# Patient Record
Sex: Female | Born: 1955 | Race: White | Hispanic: No | Smoking: Former smoker
Health system: Southern US, Community
[De-identification: ages and names within clinical notes are randomized; demographics above are authoritative.]

## PROBLEM LIST (undated history)

## (undated) DIAGNOSIS — N183 Chronic kidney disease, stage 3 unspecified: Secondary | ICD-10-CM

## (undated) DIAGNOSIS — I209 Angina pectoris, unspecified: Secondary | ICD-10-CM

## (undated) DIAGNOSIS — Q249 Congenital malformation of heart, unspecified: Secondary | ICD-10-CM

## (undated) DIAGNOSIS — F1190 Opioid use, unspecified, uncomplicated: Secondary | ICD-10-CM

## (undated) DIAGNOSIS — M4800 Spinal stenosis, site unspecified: Secondary | ICD-10-CM

## (undated) DIAGNOSIS — F419 Anxiety disorder, unspecified: Secondary | ICD-10-CM

## (undated) DIAGNOSIS — K449 Diaphragmatic hernia without obstruction or gangrene: Secondary | ICD-10-CM

## (undated) DIAGNOSIS — F32A Depression, unspecified: Secondary | ICD-10-CM

## (undated) DIAGNOSIS — M960 Pseudarthrosis after fusion or arthrodesis: Secondary | ICD-10-CM

## (undated) DIAGNOSIS — B019 Varicella without complication: Secondary | ICD-10-CM

## (undated) DIAGNOSIS — M238X9 Other internal derangements of unspecified knee: Secondary | ICD-10-CM

## (undated) DIAGNOSIS — G629 Polyneuropathy, unspecified: Secondary | ICD-10-CM

## (undated) DIAGNOSIS — G4730 Sleep apnea, unspecified: Secondary | ICD-10-CM

## (undated) DIAGNOSIS — A419 Sepsis, unspecified organism: Secondary | ICD-10-CM

## (undated) DIAGNOSIS — R5382 Chronic fatigue, unspecified: Secondary | ICD-10-CM

## (undated) DIAGNOSIS — F329 Major depressive disorder, single episode, unspecified: Secondary | ICD-10-CM

## (undated) DIAGNOSIS — I2510 Atherosclerotic heart disease of native coronary artery without angina pectoris: Secondary | ICD-10-CM

## (undated) DIAGNOSIS — M1990 Unspecified osteoarthritis, unspecified site: Secondary | ICD-10-CM

## (undated) DIAGNOSIS — I456 Pre-excitation syndrome: Secondary | ICD-10-CM

## (undated) DIAGNOSIS — I2589 Other forms of chronic ischemic heart disease: Secondary | ICD-10-CM

## (undated) DIAGNOSIS — S98111A Complete traumatic amputation of right great toe, initial encounter: Secondary | ICD-10-CM

## (undated) DIAGNOSIS — Z22322 Carrier or suspected carrier of Methicillin resistant Staphylococcus aureus: Secondary | ICD-10-CM

## (undated) DIAGNOSIS — I700 Atherosclerosis of aorta: Secondary | ICD-10-CM

## (undated) DIAGNOSIS — K5790 Diverticulosis of intestine, part unspecified, without perforation or abscess without bleeding: Secondary | ICD-10-CM

## (undated) DIAGNOSIS — S98131A Complete traumatic amputation of one right lesser toe, initial encounter: Secondary | ICD-10-CM

## (undated) DIAGNOSIS — M47819 Spondylosis without myelopathy or radiculopathy, site unspecified: Secondary | ICD-10-CM

## (undated) DIAGNOSIS — D649 Anemia, unspecified: Secondary | ICD-10-CM

## (undated) DIAGNOSIS — M797 Fibromyalgia: Secondary | ICD-10-CM

## (undated) DIAGNOSIS — G2581 Restless legs syndrome: Secondary | ICD-10-CM

## (undated) DIAGNOSIS — E785 Hyperlipidemia, unspecified: Secondary | ICD-10-CM

## (undated) DIAGNOSIS — E119 Type 2 diabetes mellitus without complications: Secondary | ICD-10-CM

## (undated) DIAGNOSIS — I519 Heart disease, unspecified: Secondary | ICD-10-CM

## (undated) DIAGNOSIS — K589 Irritable bowel syndrome without diarrhea: Secondary | ICD-10-CM

## (undated) DIAGNOSIS — R230 Cyanosis: Secondary | ICD-10-CM

## (undated) DIAGNOSIS — IMO0002 Reserved for concepts with insufficient information to code with codable children: Secondary | ICD-10-CM

## (undated) DIAGNOSIS — N289 Disorder of kidney and ureter, unspecified: Secondary | ICD-10-CM

## (undated) DIAGNOSIS — S301XXA Contusion of abdominal wall, initial encounter: Secondary | ICD-10-CM

## (undated) DIAGNOSIS — N189 Chronic kidney disease, unspecified: Secondary | ICD-10-CM

## (undated) HISTORY — DX: Other internal derangements of unspecified knee: M23.8X9

## (undated) HISTORY — DX: Fibromyalgia: M79.7

## (undated) HISTORY — PX: EYE SURGERY: SHX253

## (undated) HISTORY — PX: CARPAL TUNNEL RELEASE: SHX101

## (undated) HISTORY — PX: JOINT REPLACEMENT: SHX530

## (undated) HISTORY — PX: PLANTAR FASCIA SURGERY: SHX746

## (undated) HISTORY — PX: INFUSION PUMP IMPLANTATION: SHX1824

## (undated) HISTORY — DX: Other forms of chronic ischemic heart disease: I25.89

## (undated) HISTORY — DX: Heart disease, unspecified: I51.9

## (undated) HISTORY — DX: Spinal stenosis, site unspecified: M48.00

## (undated) HISTORY — PX: OTHER SURGICAL HISTORY: SHX169

## (undated) HISTORY — PX: SPINE SURGERY: SHX786

## (undated) HISTORY — DX: Varicella without complication: B01.9

## (undated) HISTORY — PX: FOOT SURGERY: SHX648

## (undated) HISTORY — DX: Cyanosis: R23.0

## (undated) HISTORY — PX: GALLBLADDER SURGERY: SHX652

## (undated) HISTORY — PX: TOE SURGERY: SHX1073

## (undated) HISTORY — DX: Reserved for concepts with insufficient information to code with codable children: IMO0002

## (undated) HISTORY — DX: Polyneuropathy, unspecified: G62.9

## (undated) HISTORY — PX: TOE AMPUTATION: SHX809

## (undated) HISTORY — DX: Spondylosis without myelopathy or radiculopathy, site unspecified: M47.819

## (undated) HISTORY — DX: Congenital malformation of heart, unspecified: Q24.9

## (undated) HISTORY — DX: Carrier or suspected carrier of methicillin resistant Staphylococcus aureus: Z22.322

## (undated) HISTORY — PX: KNEE SURGERY: SHX244

## (undated) HISTORY — DX: Hyperlipidemia, unspecified: E78.5

## (undated) HISTORY — PX: BACK SURGERY: SHX140

## (undated) HISTORY — PX: ABLATION: SHX5711

## (undated) HISTORY — PX: HAND SURGERY: SHX662

---

## 1990-07-02 DIAGNOSIS — Q249 Congenital malformation of heart, unspecified: Secondary | ICD-10-CM

## 1990-07-02 DIAGNOSIS — I456 Pre-excitation syndrome: Secondary | ICD-10-CM

## 1990-07-02 HISTORY — PX: CARDIAC ELECTROPHYSIOLOGY STUDY AND ABLATION: SHX1294

## 1990-07-02 HISTORY — DX: Congenital malformation of heart, unspecified: Q24.9

## 1990-07-02 HISTORY — DX: Pre-excitation syndrome: I45.6

## 1998-05-08 ENCOUNTER — Ambulatory Visit (HOSPITAL_COMMUNITY): Admission: RE | Admit: 1998-05-08 | Discharge: 1998-05-08 | Payer: Self-pay | Admitting: Neurosurgery

## 1998-05-08 ENCOUNTER — Encounter: Payer: Self-pay | Admitting: Neurosurgery

## 1999-05-09 ENCOUNTER — Encounter: Payer: Self-pay | Admitting: Emergency Medicine

## 1999-05-09 ENCOUNTER — Emergency Department (HOSPITAL_COMMUNITY): Admission: EM | Admit: 1999-05-09 | Discharge: 1999-05-09 | Payer: Self-pay | Admitting: Emergency Medicine

## 1999-05-11 ENCOUNTER — Other Ambulatory Visit: Admission: RE | Admit: 1999-05-11 | Discharge: 1999-05-11 | Payer: Self-pay | Admitting: *Deleted

## 2000-02-18 ENCOUNTER — Ambulatory Visit (HOSPITAL_COMMUNITY): Admission: RE | Admit: 2000-02-18 | Discharge: 2000-02-18 | Payer: Self-pay | Admitting: Neurosurgery

## 2000-02-18 ENCOUNTER — Encounter: Payer: Self-pay | Admitting: Neurosurgery

## 2000-09-12 ENCOUNTER — Encounter: Payer: Self-pay | Admitting: Family Medicine

## 2000-09-12 ENCOUNTER — Encounter: Admission: RE | Admit: 2000-09-12 | Discharge: 2000-09-12 | Payer: Self-pay | Admitting: Family Medicine

## 2000-10-04 ENCOUNTER — Encounter (INDEPENDENT_AMBULATORY_CARE_PROVIDER_SITE_OTHER): Payer: Self-pay

## 2000-10-04 ENCOUNTER — Other Ambulatory Visit: Admission: RE | Admit: 2000-10-04 | Discharge: 2000-10-04 | Payer: Self-pay | Admitting: Gynecology

## 2000-10-11 ENCOUNTER — Encounter: Payer: Self-pay | Admitting: Family Medicine

## 2000-10-11 ENCOUNTER — Encounter: Admission: RE | Admit: 2000-10-11 | Discharge: 2000-10-11 | Payer: Self-pay | Admitting: Family Medicine

## 2000-11-06 ENCOUNTER — Ambulatory Visit (HOSPITAL_BASED_OUTPATIENT_CLINIC_OR_DEPARTMENT_OTHER): Admission: RE | Admit: 2000-11-06 | Discharge: 2000-11-06 | Payer: Self-pay | Admitting: *Deleted

## 2000-11-06 ENCOUNTER — Encounter (INDEPENDENT_AMBULATORY_CARE_PROVIDER_SITE_OTHER): Payer: Self-pay | Admitting: *Deleted

## 2000-11-20 ENCOUNTER — Inpatient Hospital Stay (HOSPITAL_COMMUNITY): Admission: AD | Admit: 2000-11-20 | Discharge: 2000-11-20 | Payer: Self-pay | Admitting: Gynecology

## 2001-07-02 HISTORY — PX: CHOLECYSTECTOMY: SHX55

## 2001-11-04 ENCOUNTER — Encounter: Payer: Self-pay | Admitting: Gastroenterology

## 2001-11-04 ENCOUNTER — Encounter: Admission: RE | Admit: 2001-11-04 | Discharge: 2001-11-04 | Payer: Self-pay | Admitting: Infectious Diseases

## 2001-11-04 ENCOUNTER — Encounter: Admission: RE | Admit: 2001-11-04 | Discharge: 2001-11-04 | Payer: Self-pay | Admitting: Gastroenterology

## 2001-11-10 ENCOUNTER — Encounter: Admission: RE | Admit: 2001-11-10 | Discharge: 2001-11-10 | Payer: Self-pay | Admitting: Gastroenterology

## 2001-11-10 ENCOUNTER — Encounter: Payer: Self-pay | Admitting: Gastroenterology

## 2001-11-13 ENCOUNTER — Encounter: Payer: Self-pay | Admitting: Gastroenterology

## 2001-11-13 ENCOUNTER — Encounter: Admission: RE | Admit: 2001-11-13 | Discharge: 2001-11-13 | Payer: Self-pay | Admitting: Gastroenterology

## 2001-11-25 ENCOUNTER — Encounter: Admission: RE | Admit: 2001-11-25 | Discharge: 2001-11-25 | Payer: Self-pay | Admitting: Infectious Diseases

## 2002-06-20 ENCOUNTER — Encounter: Admission: RE | Admit: 2002-06-20 | Discharge: 2002-06-20 | Payer: Self-pay | Admitting: *Deleted

## 2002-06-20 ENCOUNTER — Encounter: Payer: Self-pay | Admitting: *Deleted

## 2002-06-30 ENCOUNTER — Encounter: Payer: Self-pay | Admitting: *Deleted

## 2002-06-30 ENCOUNTER — Encounter: Admission: RE | Admit: 2002-06-30 | Discharge: 2002-06-30 | Payer: Self-pay | Admitting: *Deleted

## 2003-08-17 DIAGNOSIS — Z9989 Dependence on other enabling machines and devices: Secondary | ICD-10-CM

## 2003-08-17 DIAGNOSIS — G4733 Obstructive sleep apnea (adult) (pediatric): Secondary | ICD-10-CM

## 2003-08-17 DIAGNOSIS — G4730 Sleep apnea, unspecified: Secondary | ICD-10-CM

## 2003-08-17 HISTORY — DX: Obstructive sleep apnea (adult) (pediatric): G47.33

## 2003-08-17 HISTORY — DX: Obstructive sleep apnea (adult) (pediatric): Z99.89

## 2003-08-17 HISTORY — DX: Sleep apnea, unspecified: G47.30

## 2004-04-20 ENCOUNTER — Ambulatory Visit: Payer: Self-pay | Admitting: Physician Assistant

## 2004-04-29 ENCOUNTER — Ambulatory Visit: Payer: Self-pay | Admitting: Physician Assistant

## 2004-05-30 ENCOUNTER — Ambulatory Visit: Payer: Self-pay | Admitting: Physician Assistant

## 2004-06-22 ENCOUNTER — Ambulatory Visit: Payer: Self-pay | Admitting: Gastroenterology

## 2004-06-28 ENCOUNTER — Ambulatory Visit: Payer: Self-pay | Admitting: Physician Assistant

## 2004-07-27 ENCOUNTER — Ambulatory Visit: Payer: Self-pay | Admitting: Physician Assistant

## 2004-09-04 ENCOUNTER — Ambulatory Visit: Payer: Self-pay | Admitting: Physician Assistant

## 2004-09-08 ENCOUNTER — Ambulatory Visit: Payer: Self-pay | Admitting: Pain Medicine

## 2004-09-12 ENCOUNTER — Ambulatory Visit: Payer: Self-pay | Admitting: Pain Medicine

## 2004-09-25 ENCOUNTER — Ambulatory Visit: Payer: Self-pay | Admitting: Pain Medicine

## 2004-10-26 ENCOUNTER — Ambulatory Visit: Payer: Self-pay | Admitting: Pain Medicine

## 2004-11-01 ENCOUNTER — Ambulatory Visit: Payer: Self-pay | Admitting: Pain Medicine

## 2004-11-02 ENCOUNTER — Ambulatory Visit: Payer: Self-pay | Admitting: Pain Medicine

## 2004-11-15 ENCOUNTER — Ambulatory Visit: Payer: Self-pay | Admitting: Pain Medicine

## 2004-12-18 ENCOUNTER — Ambulatory Visit: Payer: Self-pay | Admitting: Pain Medicine

## 2004-12-18 ENCOUNTER — Inpatient Hospital Stay: Payer: Self-pay | Admitting: Pain Medicine

## 2004-12-27 ENCOUNTER — Ambulatory Visit: Payer: Self-pay | Admitting: Pain Medicine

## 2005-01-24 ENCOUNTER — Ambulatory Visit: Payer: Self-pay | Admitting: Pain Medicine

## 2005-01-24 ENCOUNTER — Other Ambulatory Visit: Payer: Self-pay

## 2005-01-31 ENCOUNTER — Ambulatory Visit: Payer: Self-pay | Admitting: Pain Medicine

## 2005-02-09 ENCOUNTER — Ambulatory Visit: Payer: Self-pay | Admitting: Pain Medicine

## 2005-02-26 ENCOUNTER — Ambulatory Visit: Payer: Self-pay | Admitting: Pain Medicine

## 2005-06-07 ENCOUNTER — Ambulatory Visit: Payer: Self-pay | Admitting: Physician Assistant

## 2005-07-02 HISTORY — PX: ENDOMETRIAL ABLATION: SHX621

## 2005-07-02 HISTORY — PX: ABLATION: SHX5711

## 2005-09-03 ENCOUNTER — Ambulatory Visit: Payer: Self-pay | Admitting: Physician Assistant

## 2005-10-15 ENCOUNTER — Ambulatory Visit: Payer: Self-pay | Admitting: Physician Assistant

## 2005-11-19 ENCOUNTER — Ambulatory Visit: Payer: Self-pay | Admitting: Physician Assistant

## 2005-11-22 ENCOUNTER — Ambulatory Visit: Payer: Self-pay | Admitting: Pain Medicine

## 2005-11-29 ENCOUNTER — Ambulatory Visit: Payer: Self-pay | Admitting: Physician Assistant

## 2006-03-28 ENCOUNTER — Ambulatory Visit: Payer: Self-pay | Admitting: Physician Assistant

## 2006-05-20 ENCOUNTER — Ambulatory Visit: Payer: Self-pay | Admitting: Physician Assistant

## 2006-05-21 ENCOUNTER — Ambulatory Visit: Payer: Self-pay | Admitting: Pain Medicine

## 2006-07-02 HISTORY — PX: CORONARY ANGIOPLASTY: SHX604

## 2006-07-25 ENCOUNTER — Ambulatory Visit: Payer: Self-pay | Admitting: Physician Assistant

## 2006-09-02 ENCOUNTER — Encounter: Admission: RE | Admit: 2006-09-02 | Discharge: 2006-09-02 | Payer: Self-pay | Admitting: Family Medicine

## 2006-09-19 ENCOUNTER — Ambulatory Visit: Payer: Self-pay | Admitting: Physician Assistant

## 2006-11-15 ENCOUNTER — Ambulatory Visit: Payer: Self-pay | Admitting: Physician Assistant

## 2006-11-20 ENCOUNTER — Ambulatory Visit: Payer: Self-pay | Admitting: Physician Assistant

## 2007-03-18 ENCOUNTER — Ambulatory Visit: Payer: Self-pay | Admitting: Family Medicine

## 2007-03-20 ENCOUNTER — Ambulatory Visit: Payer: Self-pay | Admitting: Physician Assistant

## 2007-04-01 ENCOUNTER — Ambulatory Visit: Payer: Self-pay | Admitting: Pain Medicine

## 2007-04-15 ENCOUNTER — Ambulatory Visit: Payer: Self-pay | Admitting: Physician Assistant

## 2007-06-19 ENCOUNTER — Ambulatory Visit: Payer: Self-pay | Admitting: Pain Medicine

## 2007-06-24 ENCOUNTER — Encounter: Payer: Self-pay | Admitting: Pain Medicine

## 2007-07-03 ENCOUNTER — Encounter: Payer: Self-pay | Admitting: Pain Medicine

## 2007-07-03 DIAGNOSIS — I2510 Atherosclerotic heart disease of native coronary artery without angina pectoris: Secondary | ICD-10-CM

## 2007-07-03 HISTORY — DX: Atherosclerotic heart disease of native coronary artery without angina pectoris: I25.10

## 2007-07-03 HISTORY — PX: CORONARY ANGIOPLASTY WITH STENT PLACEMENT: SHX49

## 2007-07-03 HISTORY — PX: OTHER SURGICAL HISTORY: SHX169

## 2007-07-16 ENCOUNTER — Ambulatory Visit: Payer: Self-pay | Admitting: Physician Assistant

## 2007-07-29 ENCOUNTER — Emergency Department (HOSPITAL_COMMUNITY): Admission: EM | Admit: 2007-07-29 | Discharge: 2007-07-29 | Payer: Self-pay | Admitting: Emergency Medicine

## 2007-08-03 ENCOUNTER — Encounter: Payer: Self-pay | Admitting: Pain Medicine

## 2007-11-12 ENCOUNTER — Ambulatory Visit: Payer: Self-pay | Admitting: Physician Assistant

## 2007-11-20 ENCOUNTER — Ambulatory Visit: Payer: Self-pay | Admitting: Pain Medicine

## 2007-11-26 ENCOUNTER — Ambulatory Visit: Payer: Self-pay | Admitting: Pain Medicine

## 2007-12-09 ENCOUNTER — Ambulatory Visit: Payer: Self-pay | Admitting: Physician Assistant

## 2007-12-11 ENCOUNTER — Encounter: Admission: RE | Admit: 2007-12-11 | Discharge: 2007-12-11 | Payer: Self-pay | Admitting: Obstetrics and Gynecology

## 2007-12-23 ENCOUNTER — Encounter: Admission: RE | Admit: 2007-12-23 | Discharge: 2007-12-23 | Payer: Self-pay | Admitting: Obstetrics and Gynecology

## 2008-01-15 ENCOUNTER — Ambulatory Visit: Payer: Self-pay | Admitting: Physician Assistant

## 2008-01-22 ENCOUNTER — Ambulatory Visit: Payer: Self-pay | Admitting: Pain Medicine

## 2008-02-11 ENCOUNTER — Ambulatory Visit: Payer: Self-pay | Admitting: Physician Assistant

## 2008-02-19 ENCOUNTER — Inpatient Hospital Stay (HOSPITAL_COMMUNITY): Admission: AD | Admit: 2008-02-19 | Discharge: 2008-02-21 | Payer: Self-pay | Admitting: Interventional Cardiology

## 2008-03-10 ENCOUNTER — Ambulatory Visit: Payer: Self-pay | Admitting: Physician Assistant

## 2008-03-12 ENCOUNTER — Emergency Department (HOSPITAL_COMMUNITY): Admission: EM | Admit: 2008-03-12 | Discharge: 2008-03-12 | Payer: Self-pay | Admitting: Emergency Medicine

## 2008-07-07 ENCOUNTER — Ambulatory Visit: Payer: Self-pay | Admitting: Physician Assistant

## 2008-07-19 ENCOUNTER — Ambulatory Visit: Payer: Self-pay | Admitting: Family Medicine

## 2008-08-04 ENCOUNTER — Ambulatory Visit: Payer: Self-pay | Admitting: Physician Assistant

## 2008-11-02 ENCOUNTER — Ambulatory Visit: Payer: Self-pay | Admitting: Physician Assistant

## 2009-01-26 ENCOUNTER — Ambulatory Visit: Payer: Self-pay | Admitting: Physician Assistant

## 2009-02-15 ENCOUNTER — Ambulatory Visit: Payer: Self-pay | Admitting: Orthopaedic Surgery

## 2009-05-03 ENCOUNTER — Ambulatory Visit: Payer: Self-pay | Admitting: Physician Assistant

## 2009-05-17 ENCOUNTER — Ambulatory Visit: Payer: Self-pay | Admitting: Pain Medicine

## 2009-06-02 ENCOUNTER — Ambulatory Visit: Payer: Self-pay | Admitting: Physician Assistant

## 2009-07-02 DIAGNOSIS — Z22322 Carrier or suspected carrier of Methicillin resistant Staphylococcus aureus: Secondary | ICD-10-CM

## 2009-07-02 HISTORY — DX: Carrier or suspected carrier of methicillin resistant Staphylococcus aureus: Z22.322

## 2009-08-22 ENCOUNTER — Ambulatory Visit: Payer: Self-pay | Admitting: Pain Medicine

## 2009-09-05 ENCOUNTER — Encounter: Payer: Self-pay | Admitting: Pain Medicine

## 2009-09-06 ENCOUNTER — Ambulatory Visit: Payer: Self-pay | Admitting: Pain Medicine

## 2009-09-22 ENCOUNTER — Ambulatory Visit: Payer: Self-pay | Admitting: Pain Medicine

## 2009-10-04 ENCOUNTER — Encounter: Payer: Self-pay | Admitting: Pain Medicine

## 2009-10-29 ENCOUNTER — Emergency Department: Payer: Self-pay | Admitting: Emergency Medicine

## 2009-10-31 ENCOUNTER — Encounter: Payer: Self-pay | Admitting: Pain Medicine

## 2009-11-03 ENCOUNTER — Ambulatory Visit: Payer: Self-pay | Admitting: Pain Medicine

## 2009-11-21 ENCOUNTER — Ambulatory Visit: Payer: Self-pay | Admitting: Pain Medicine

## 2009-12-05 ENCOUNTER — Encounter: Payer: Self-pay | Admitting: Orthopedic Surgery

## 2009-12-12 ENCOUNTER — Ambulatory Visit: Payer: Self-pay | Admitting: Pain Medicine

## 2009-12-30 ENCOUNTER — Encounter: Payer: Self-pay | Admitting: Orthopedic Surgery

## 2010-01-30 ENCOUNTER — Encounter: Payer: Self-pay | Admitting: Orthopedic Surgery

## 2010-03-01 ENCOUNTER — Encounter: Payer: Self-pay | Admitting: Infectious Disease

## 2010-03-08 ENCOUNTER — Ambulatory Visit (HOSPITAL_COMMUNITY): Admission: RE | Admit: 2010-03-08 | Discharge: 2010-03-08 | Payer: Self-pay | Admitting: Podiatry

## 2010-03-18 ENCOUNTER — Other Ambulatory Visit: Payer: Self-pay | Admitting: Podiatry

## 2010-03-23 ENCOUNTER — Other Ambulatory Visit: Payer: Self-pay | Admitting: Podiatry

## 2010-04-04 ENCOUNTER — Other Ambulatory Visit: Payer: Self-pay | Admitting: Podiatry

## 2010-04-05 ENCOUNTER — Other Ambulatory Visit: Payer: Self-pay | Admitting: Podiatry

## 2010-04-18 ENCOUNTER — Ambulatory Visit: Payer: Self-pay | Admitting: Infectious Disease

## 2010-04-18 DIAGNOSIS — G629 Polyneuropathy, unspecified: Secondary | ICD-10-CM

## 2010-04-18 DIAGNOSIS — M86679 Other chronic osteomyelitis, unspecified ankle and foot: Secondary | ICD-10-CM | POA: Insufficient documentation

## 2010-04-18 DIAGNOSIS — Z9189 Other specified personal risk factors, not elsewhere classified: Secondary | ICD-10-CM | POA: Insufficient documentation

## 2010-04-18 DIAGNOSIS — G894 Chronic pain syndrome: Secondary | ICD-10-CM | POA: Insufficient documentation

## 2010-04-18 HISTORY — DX: Polyneuropathy, unspecified: G62.9

## 2010-04-19 ENCOUNTER — Ambulatory Visit (HOSPITAL_COMMUNITY): Admission: RE | Admit: 2010-04-19 | Discharge: 2010-04-19 | Payer: Self-pay | Admitting: Infectious Disease

## 2010-04-19 ENCOUNTER — Encounter: Payer: Self-pay | Admitting: Infectious Diseases

## 2010-04-19 LAB — CONVERTED CEMR LAB
ALT: 23 units/L (ref 0–35)
AST: 25 units/L (ref 0–37)
Albumin: 4.9 g/dL (ref 3.5–5.2)
Alkaline Phosphatase: 140 units/L — ABNORMAL HIGH (ref 39–117)
BUN: 16 mg/dL (ref 6–23)
Basophils Absolute: 0.1 10*3/uL (ref 0.0–0.1)
Basophils Relative: 1 % (ref 0–1)
CO2: 27 meq/L (ref 19–32)
CRP: 0.3 mg/dL (ref <0.6)
Calcium: 9.5 mg/dL (ref 8.4–10.5)
Chloride: 101 meq/L (ref 96–112)
Creatinine, Ser: 1.04 mg/dL (ref 0.40–1.20)
Eosinophils Absolute: 0.3 10*3/uL (ref 0.0–0.7)
Eosinophils Relative: 5 % (ref 0–5)
Glucose, Bld: 75 mg/dL (ref 70–99)
HCT: 37.7 % (ref 36.0–46.0)
HIV: NONREACTIVE
Hemoglobin: 12.9 g/dL (ref 12.0–15.0)
Lymphocytes Relative: 48 % — ABNORMAL HIGH (ref 12–46)
Lymphs Abs: 3.2 10*3/uL (ref 0.7–4.0)
MCHC: 34.2 g/dL (ref 30.0–36.0)
MCV: 86.3 fL (ref 78.0–100.0)
Monocytes Absolute: 0.6 10*3/uL (ref 0.1–1.0)
Monocytes Relative: 9 % (ref 3–12)
Neutro Abs: 2.4 10*3/uL (ref 1.7–7.7)
Neutrophils Relative %: 37 % — ABNORMAL LOW (ref 43–77)
Platelets: 266 10*3/uL (ref 150–400)
Potassium: 3.9 meq/L (ref 3.5–5.3)
RBC: 4.37 M/uL (ref 3.87–5.11)
RDW: 12.9 % (ref 11.5–15.5)
Sed Rate: 14 mm/hr (ref 0–22)
Sodium: 138 meq/L (ref 135–145)
Total Bilirubin: 0.4 mg/dL (ref 0.3–1.2)
Total Protein: 7.3 g/dL (ref 6.0–8.3)
WBC: 6.6 10*3/uL (ref 4.0–10.5)

## 2010-04-24 ENCOUNTER — Telehealth (INDEPENDENT_AMBULATORY_CARE_PROVIDER_SITE_OTHER): Payer: Self-pay | Admitting: *Deleted

## 2010-04-26 ENCOUNTER — Encounter: Payer: Self-pay | Admitting: Infectious Disease

## 2010-04-28 ENCOUNTER — Telehealth (INDEPENDENT_AMBULATORY_CARE_PROVIDER_SITE_OTHER): Payer: Self-pay | Admitting: *Deleted

## 2010-05-10 ENCOUNTER — Ambulatory Visit: Payer: Self-pay | Admitting: Infectious Disease

## 2010-05-10 DIAGNOSIS — A4902 Methicillin resistant Staphylococcus aureus infection, unspecified site: Secondary | ICD-10-CM | POA: Insufficient documentation

## 2010-05-12 ENCOUNTER — Encounter: Payer: Self-pay | Admitting: Infectious Disease

## 2010-05-23 ENCOUNTER — Inpatient Hospital Stay (HOSPITAL_COMMUNITY): Admission: RE | Admit: 2010-05-23 | Discharge: 2010-05-24 | Payer: Self-pay | Admitting: Orthopaedic Surgery

## 2010-05-23 ENCOUNTER — Encounter (INDEPENDENT_AMBULATORY_CARE_PROVIDER_SITE_OTHER): Payer: Self-pay | Admitting: Orthopaedic Surgery

## 2010-05-23 ENCOUNTER — Ambulatory Visit: Payer: Self-pay | Admitting: Infectious Disease

## 2010-05-30 DIAGNOSIS — S98111A Complete traumatic amputation of right great toe, initial encounter: Secondary | ICD-10-CM

## 2010-05-30 HISTORY — DX: Complete traumatic amputation of right great toe, initial encounter: S98.111A

## 2010-06-02 ENCOUNTER — Encounter: Payer: Self-pay | Admitting: Infectious Disease

## 2010-06-14 ENCOUNTER — Encounter: Payer: Self-pay | Admitting: Infectious Disease

## 2010-06-16 ENCOUNTER — Telehealth: Payer: Self-pay | Admitting: Infectious Disease

## 2010-06-19 ENCOUNTER — Encounter: Payer: Self-pay | Admitting: Infectious Disease

## 2010-06-20 ENCOUNTER — Ambulatory Visit (INDEPENDENT_AMBULATORY_CARE_PROVIDER_SITE_OTHER): Payer: Medicare Other | Admitting: Infectious Disease

## 2010-07-23 ENCOUNTER — Encounter: Payer: Self-pay | Admitting: Obstetrics and Gynecology

## 2010-07-25 ENCOUNTER — Telehealth: Payer: Self-pay | Admitting: Infectious Disease

## 2010-07-26 ENCOUNTER — Encounter: Payer: Self-pay | Admitting: Infectious Disease

## 2010-07-27 ENCOUNTER — Telehealth: Payer: Self-pay | Admitting: Infectious Disease

## 2010-08-03 NOTE — Miscellaneous (Signed)
Summary: HIPAA Restrictions  HIPAA Restrictions   Imported By: Bonner Puna 04/18/2010 15:05:18  _____________________________________________________________________  External Attachment:    Type:   Image     Comment:   External Document

## 2010-08-03 NOTE — Assessment & Plan Note (Signed)
Summary: new pt osteomyelitis   Visit Type:  Consult Referring Provider:  Harlow [name] Primary Provider:  Dr. Tobe Sos  CC:  new patient osteomyelitis.  History of Present Illness: 55 year old with CAD, chronic pain, neuropathy,  problems with plantar .fascitis sp steroid injections, later arthrodesis  of great to and IP joint hallux on right with hardware by Dr Barkley Bruns from [facility] [place]. Patient developed severe swelling several months ago and had surgery on 03/01/10 wtih removal of hardware and insertion of new wires? per pt. At that time culture was obtained which pt claims was from bone. Cultures were not available when I initially saw the pt. But I have now found that she grew MRSA from this site R to clindamycin, S to levaquin, gent, vancomycin, tmp/smx, linezolid, rifampin, tetracycline,  She had been started on augmentin and then was changed over to IV vancomcin and given a  6 wk course. Films in tinerim had shown movement of wires. Pt had "k wires" removed fon 03/10/10. Pt finished course of IV vancomycin and then was changed to one ds bactrim two times a day with rifampin. She believes that all hardware is out but I am not certain of this and addition of rifampin makes me question this. She has continued to have swelling and persistent osteomyelitis. She was referred to [place] by Dr. Barkley Bruns as an urgent consult. She has had [place] of foot but no MRI or CT. Doing the former is complicated as it involves inactivatingher Morphine Sulfate pump that must be done at [facility] pain center. Pts toe swelling has reduced dramatically from when it still had pins in place but is still profoundly swollen. She denies overt fevers, chills, malaise. I spent geater than on ahour on this pt including greater than 50% of time face to face counselilng the pt and coordinating care.  Problems Prior to Update: None  Medications Prior to Update: 1)  None  Current Medications (verified): 1)  Crestor 20 Mg Tabs  (Rosuvastatin Calcium) .... Take 1 Tablet By Mouth Daily 2)  Rifampin 300 Mg Caps (Rifampin) .... Take 1 Capsule By Mouth Daily For 4 Weeks 3)  Smz-Tmp Ds 800-160 Mg Tabs (Sulfamethoxazole-Trimethoprim) .... Take One Tablet By Mouth Twice Daily For 4 Weeks 4)  Nuvigil 250 Mg Tabs (Armodafinil) .... Take 1 Tablet Daily As Needed For Sleepiness 5)  Flecainide Acetate 50 Mg Tabs (Flecainide Acetate) .... Take 1 Tablet By Mouth Every 12 Hours 6)  Zetia 10 Mg Tabs (Ezetimibe) .... Take 1 Tablet By Mouth Daily 7)  Oxycodone-Acetaminophen 5-325 Mg Tabs (Oxycodone-Acetaminophen) .... Take 1 Tablet Three Times Daily As Needed For Pain 8)  Tizanidine Hcl 4 Mg Tabs (Tizanidine Hcl) .... Take One Tablet By Mouth 3 Times Daily 9)  Lyrica 75 Mg Caps (Pregabalin) .... Take One Capsule By Mouth Every Morning and Two Capsules (150mg ) By Mouth Every Night At Bedtime 10)  Paroxetine Hcl 20 Mg Tabs (Paroxetine Hcl) .... Take 0.5 Tablet By Mouth Twice Daily 11)  Aspirin 81 Mg Tabs (Aspirin) .... 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    Fibromyalgia.  Chronic pain with  morphine pump Neuropathy  Past Surgical History:  Lumbar surgery x 3  Status post cholecystectomy.   Family History: Reviewed history and no changes required. Mother diseased from lung cancer Dad had CAD, DM,   Social History: Reviewed history and no changes required. former smoker, no alcohol, no drugs, same sex partner  Review of Systems       The patient complains of suspicious skin lesions and difficulty walking.  The patient denies anorexia, fever, weight loss, weight gain,  vision loss, decreased hearing, hoarseness, chest pain, syncope, dyspnea on exertion, peripheral edema, prolonged cough, headaches, hemoptysis, abdominal pain, melena, hematochezia, severe indigestion/heartburn, hematuria, incontinence, genital sores, muscle weakness, transient blindness, depression, unusual weight change, abnormal bleeding, and enlarged lymph nodes.    Vital Signs:  Patient profile:   55 year old female Height:      70 inches Weight:      225.8 pounds BMI:     32.52 CC: new patient osteomyelitis Nutritional Status BMI of > 30 = obese   Physical Exam  General:  alert, well-developed, and well-nourished.   Head:  normocephalic and atraumatic.   Eyes:  vision grossly intact, pupils equal, and pupils round.   Ears:  no external deformities.   Nose:  no external deformity.   Mouth:  pharynx pink and moist, no erythema, and no exudates.   Neck:  supple and full ROM.   Lungs:  normal respiratory effort, no crackles, and no wheezes.   Heart:  normal rate, regular rhythm, no murmur, and no gallop.   Abdomen:  soft and non-tender.   Msk:  see ext Extremities:  left foot with edematous, erythematous 1st and 2nd digits with some maceration of skin. No frank ulcer or purulence seen.  Neurologic:  alert & oriented X3 and strength normal in all extremities.   Skin:  see ext Psych:  Oriented X3, memory intact for recent and remote, and normally interactive.     Impression & Recommendations:  Problem # 1:  CHRONIC OSTEOMYELITIS ANKLE AND FOOT (ICD-730.17) Will check CT scan of leg, check esr, crp, cmp, cbc. I suspect that she has nidus of infection here that needs surgical attention. COuld be abscess plus osteomyeliits. I have counselled her that she may ultimately need amputation. I have asked her to take two DS bactrims twice daily with her rifampin for now--but I am skeptical that we are goign to arrive at a cure without some surgical intervention. I need more data still and  need clarification with re to hardware still present and if the MRSA was from actually deep culture or superficial--it would still be a typical suspect here Her updated medication list for this problem includes:    Rifampin 300 Mg Caps (Rifampin) .Marland Kitchen... Take 1 capsule by mouth daily for 4 weeks    Smz-tmp Ds 800-160 Mg Tabs (Sulfamethoxazole-trimethoprim) .Marland Kitchen... Take one tablet by mouth twice daily for 4 weeks    Oxycodone-acetaminophen 5-325 Mg Tabs (Oxycodone-acetaminophen) .Marland Kitchen... Take 1 tablet three times daily as needed for pain    Aspirin 81 Mg Tabs (Aspirin) .Marland Kitchen... Take 1 tablet by mouth daily  Orders: T-C-Reactive Protein [id number]) T-Sed Rate (Automated) [id number]) T-CBC w/Diff [id number]) T-Comprehensive Metabolic Panel ([id number]) T-HIV Antibody  (Reflex) [id number]) CT with Contrast (CT w/ contrast) Consultation Level V [id number])  Problem # 2:  FEVER, HX OF (ICD-V15.9) not febrile recently and at exceedingly low risk for HIV (never been documented case of woman to woman transfer of hIV) but wil check for thoroughness Orders:  T-HIV Antibody  (Reflex) [id number]) Consultation Level V [id number])  Problem # 3:  NEUROPATHY (ICD-355.9)  unclear what cause was being wu by [facility]  Orders: Consultation Level V [id number])  Problem # 4:  CHRONIC PAIN SYNDROME (ICD-338.4)  on Morphine Sulfate pump from [facility]  Orders: Consultation Level V [id number])  Medications Added to Medication List This Visit: 1)  Crestor 20 Mg Tabs (Rosuvastatin calcium) .... Take 1 tablet by mouth daily 2)  Rifampin 300 Mg Caps (Rifampin) .... Take 1 capsule by mouth daily for 4 weeks 3)  Smz-tmp Ds 800-160 Mg Tabs (Sulfamethoxazole-trimethoprim) .... Take one tablet by mouth twice daily for 4 weeks 4)  Nuvigil 250 Mg Tabs (Armodafinil) .... Take 1 tablet daily as needed for sleepiness 5)  Flecainide Acetate 50 Mg Tabs (Flecainide acetate) .... Take 1 tablet by mouth every 12 hours 6)  Zetia 10 Mg Tabs  (Ezetimibe) .... Take 1 tablet by mouth daily 7)  Oxycodone-acetaminophen 5-325 Mg Tabs (Oxycodone-acetaminophen) .... Take 1 tablet three times daily as needed for pain 8)  Tizanidine Hcl 4 Mg Tabs (Tizanidine hcl) .... Take one tablet by mouth 3 times daily 9)  Lyrica 75 Mg Caps (Pregabalin) .... Take one capsule by mouth every morning and two capsules (150mg ) by mouth every night at bedtime 10)  Paroxetine Hcl 20 Mg Tabs (Paroxetine hcl) .... Take 0.5 tablet by mouth twice daily 11)  Aspirin 81 Mg Tabs (Aspirin) .... Take 1 tablet by mouth daily  Patient Instructions: 1)  We will get blood work today and set up a CT scan 2)  I will get culture data from Dr. Barkley Bruns office 3)  We will make plan based on that data 4)  Make a followup  appt in the inerim for 2 wks from now

## 2010-08-03 NOTE — Progress Notes (Addendum)
Summary: pts. appt.  Phone Note Call from Patient   Caller: Patient Summary of Call: Pt. called stating the swelling was better in her toe and when she saw the surgeon yesterday they did an xray and everything showed normal.  Wanted to know if you still wanted to see her or if appt. could be canceled. Initial call taken by: Myrtis Hopping CMA Deborra Medina),  July 27, 2010 10:49 AM  Follow-up for Phone Call        Spoke with Dr. Tommy Medal he would like the patient to keep her appointment in late February with him, and not to come in tomorrow it sxs are better. Told patient to call if sxs return. Follow-up by: Jarrett Ables CMA,  July 27, 2010 3:04 PM

## 2010-08-03 NOTE — Miscellaneous (Signed)
Summary: [facility]: Orders  [facility]: Orders   Imported By: Bonner Puna 06/22/2010 10:15:41  _____________________________________________________________________  External Attachment:    Type:   Image     Comment:   External Document

## 2010-08-03 NOTE — Assessment & Plan Note (Signed)
Summary: 2WK F/U/OK PER DR VAN DAM/VS   Visit Type:  Follow-up Referring Provider:  Harlow Mares Primary Provider:  Vedia Coffer  CC:  flushing and hot flashes.  History of Present Illness: 55 year old with CAD, chronic pain, neuropathy,  problems with plantar .fascitis sp steroid injections, later arthrodesis  of great to and IP joint hallux on right with hardware by Dr Barkley Bruns from [facility] [place]. Patient developed severe swelling several months ago and had surgery on 03/01/10 wtih removal of hardware and insertion of new wires? per pt. At that time culture was obtained which pt claims was from bone. Cultures grew MRSA from this site R to clindamycin, S to levaquin, gent, vancomycin, tmp/smx, linezolid, rifampin, tetracycline,  She had been started on augmentin and then was changed over to IV vancomcin and given a  6 wk course. Films in tinerim had shown movement of wires. Pt had "k wires" removed fon 03/10/10. Pt finished course of IV vancomycin and then was changed to one ds bactrim two times a day with rifampin. She has continued to have swelling and persistent osteomyelitis. She was referred to [place] by Dr. Barkley Bruns as an urgent consult. I ordered CT scan at last appt and this showedextensive fragmentation of the proximalphalangeal bone of the great toe with pathologic fractures,secondary to chronic osteomyelitis.  There is also destruction ofthe base of the distal phalangeal bone of the great toe consistentwith chronic osteomyelitis. I referred her to orthopedic surgery because I felt she might very well need orthopedic evaluation and potential amputatoin of toe. She has seen Dr. Rhona Raider who thinks she should have amputation of big toe accordign to the pt. She is trying to schedule surgery for Nov 22. I have asked her to stay off of antibiotics so that we can have toe tissue sent for culture when it is amputated> It should ideally be sent for bacterial, afb, fungal cultures adn I would plan on  giving her additional 4-6 wks of IV antibiotics post surgery.Dr. Rhona Raider, Dr. Barkley Bruns, [place], [place], [place]  Problems Prior to Update: 1)  Fever, Hx of  (ICD-V15.9) 2)  Neuropathy  (ICD-355.9) 3)  Chronic Pain Syndrome  (ICD-338.4) 4)  Chronic Osteomyelitis Ankle and Foot  (ICD-730.17)  Medications Prior to Update: 1)  Crestor 20 Mg Tabs (Rosuvastatin Calcium) .... Take 1 Tablet By Mouth Daily 2)  Rifampin 300 Mg Caps (Rifampin) .... Take 1 Capsule By Mouth Daily For 4 Weeks 3)  Smz-Tmp Ds 800-160 Mg Tabs (Sulfamethoxazole-Trimethoprim) .... Take One Tablet By Mouth Twice Daily For 4 Weeks 4)  Nuvigil 250 Mg Tabs (Armodafinil) .... Take 1 Tablet Daily As Needed For Sleepiness 5)  Flecainide Acetate 50 Mg Tabs (Flecainide Acetate) .... Take 1 Tablet By Mouth Every 12 Hours 6)  Zetia 10 Mg Tabs (Ezetimibe) .... Take 1 Tablet By Mouth Daily 7)  Oxycodone-Acetaminophen 5-325 Mg Tabs (Oxycodone-Acetaminophen) .... Take 1 Tablet Three Times Daily As Needed For Pain 8)  Tizanidine Hcl 4 Mg Tabs (Tizanidine Hcl) .... Take One Tablet By Mouth 3 Times Daily 9)  Lyrica 75 Mg Caps (Pregabalin) .... Take One Capsule By Mouth Every Morning and Two Capsules (150mg ) By Mouth Every Night At Bedtime 10)  Paroxetine Hcl 20 Mg Tabs (Paroxetine Hcl) .... Take 0.5 Tablet By Mouth Twice Daily 11)  Aspirin 81 Mg Tabs (Aspirin) .... Take 1 Tablet By Mouth Daily  Current Medications (verified): 1)  Crestor 20 Mg Tabs (Rosuvastatin Calcium) .... Take  1 Tablet By Mouth Daily 2)  Nuvigil 250 Mg Tabs (Armodafinil) .... Take 1 Tablet Daily As Needed For Sleepiness 3)  Flecainide Acetate 50 Mg Tabs (Flecainide Acetate) .... Take 1 Tablet By Mouth Every 12 Hours 4)  Zetia 10 Mg Tabs (Ezetimibe) .... Take 1 Tablet By Mouth Daily 5)  Oxycodone-Acetaminophen 5-325 Mg Tabs (Oxycodone-Acetaminophen) .... Take 1 Tablet Three Times Daily As Needed For Pain 6)  Tizanidine Hcl  4 Mg Tabs (Tizanidine Hcl) .... Take One Tablet By Mouth 3 Times Daily 7)  Lyrica 75 Mg Caps (Pregabalin) .... Take One Capsule By Mouth Every Morning and Two Capsules (150mg ) By Mouth Every Night At Bedtime 8)  Paroxetine Hcl 20 Mg Tabs (Paroxetine Hcl) .... Take 0.5 Tablet By Mouth Twice Daily 9)  Aspirin 81 Mg Tabs (Aspirin) .... 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    Fibromyalgia.  Chronic pain with morphine pump Neuropathy  Past Surgical History: Last updated: 04/18/2010  Lumbar surgery x 3  Status post cholecystectomy.   Family History: Last updated: 04/18/2010 Mother diseased from lung cancer Dad had CAD, DM,   Social History: Last updated: 04/18/2010 former smoker, no alcohol, no drugs, same sex partner  Family History: Reviewed history from 04/18/2010 and no changes required. Mother diseased from lung cancer Dad had CAD, DM,   Social History: Reviewed history from 04/18/2010 and no changes required. former smoker, no alcohol, no drugs, same sex partner  Review of Systems       The patient complains of suspicious skin lesions.  The patient denies anorexia, fever, weight loss, weight gain, vision loss, decreased hearing, hoarseness, chest pain, syncope, dyspnea on exertion, peripheral edema, prolonged cough, headaches, hemoptysis, abdominal pain, melena, hematochezia, severe indigestion/heartburn, hematuria, incontinence, genital sores, muscle weakness, difficulty walking, depression, unusual weight change, abnormal bleeding, and enlarged lymph nodes.    Vital Signs:  Patient profile:   55 year old female Height:      70 inches (177.80 cm) Weight:      231.50 pounds  (105.23 kg) BMI:     33.34 Temp:     98.1 degrees F (36.72 degrees C) oral Pulse rate:   84 / minute BP sitting:   109 / 63  (left arm)  Vitals Entered By: Jarrett Ables CMA (May 10, 2010 9:18 AM) CC: flushing and hot flashes Is Patient Diabetic? No Pain Assessment Patient in pain? yes     Location: rt great toe Intensity: 3 Type: aching Onset of pain  Intermittent Nutritional Status BMI of > 30 = obese  Does patient need assistance? Functional Status Self care Ambulation Normal   Physical Exam  General:  alert, well-developed, and well-nourished.   Head:  normocephalic and atraumatic.   Eyes:  vision grossly intact, pupils equal, and pupils round.   Ears:  no external deformities.   Nose:  no external deformity.   Mouth:  pharynx pink and moist, no erythema, and no exudates.   Lungs:  normal respiratory effort, no crackles, and no wheezes.   Heart:  normal rate, regular rhythm, no murmur, and no gallop.   Abdomen:  soft and non-tender.   Msk:  see ext Extremities:  left foot with  edematous, erythematous 1st and 2nd digits. No frank ulcer or purulence seen.  Neurologic:  alert & oriented X3 and strength normal in all extremities.   Skin:  see ext Psych:  Oriented X3, memory intact for recent and remote, and normally interactive.     Impression & Recommendations:  Problem # 1:  CHRONIC OSTEOMYELITIS ANKLE AND FOOT (ICD-730.17)  I agree with plan by Dr. Rhona Raider to perform amputatoin of big toe. I would like this sent for AFB, fungal and bacterial cultures. I will plan on giving her 6 wks of IV antibiotics post surgeryt to make sure she has cleared this infection. I will cc this to Dr. Barkley Bruns. Pt informs me that he would have preferred to do the surgery and I apologize if I referred her on to orthopedics without first talking to Dr Barkley Bruns. I do think that if she has amputation at [facility] we can easily faciliate cultures being gathered adn IV antibiotics. Pt has  informed me she intends to proceed with amputatoin with Dr. Rhona Raider The following medications were removed from the medication list:    Rifampin 300 Mg Caps (Rifampin) .Marland Kitchen... Take 1 capsule by mouth daily for 4 weeks    Smz-tmp Ds 800-160 Mg Tabs (Sulfamethoxazole-trimethoprim) .Marland Kitchen... Take one tablet by mouth twice daily for 4 weeks Her updated medication list for this problem includes:    Oxycodone-acetaminophen 5-325 Mg Tabs (Oxycodone-acetaminophen) .Marland Kitchen... Take 1 tablet three times daily as needed for pain    Aspirin 81 Mg Tabs (Aspirin) .Marland Kitchen... Take 1 tablet by mouth daily  Orders: Est. 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 Patient Level IV [id number])  Problem # 3:  CHRONIC PAIN SYNDROME (ICD-338.4)  she has pump in place for this.  Orders: Est. Patient Level IV [id number])  Patient Instructions: 1)  I would  like you  to be off of antibiotics prior to your planned surgery 2)  I would like Dr. Rhona Raider to send the amputated toe for bacterial, afb and fungal cultures 3)  We will see  you in the hospital on the 22nd 4)  We will plan on giving you 4-6 weeks of IV antibiotics after the surgery

## 2010-08-03 NOTE — Progress Notes (Signed)
Summary: [facility] needs records  Phone Note Call from Patient Call back at [facility] Phone [phone]   Caller: Patient Reason for Call: Talk to Nurse, Referral Summary of Call: Pt. called re:  Orthopedic Referral.  Upset that the informaiton has not been sent to [facility].  CMA notified that pt. needs records sent. Lorne Skeens RN  April 24, 2010 11:38 AM

## 2010-08-03 NOTE — Miscellaneous (Signed)
Summary: [facility]: Care  Plan Update  [facility]: Care  Plan Update   Imported By: Bonner Puna 06/22/2010 10:15:06  _____________________________________________________________________  External Attachment:    Type:   Image     Comment:   External Document

## 2010-08-03 NOTE — Miscellaneous (Signed)
Summary: [place] [facility] [employer] [facility]   Imported By: Bonner Puna 05/15/2010 14:58:37  _____________________________________________________________________  External Attachment:    Type:   Image     Comment:   External Document

## 2010-08-03 NOTE — Medication Information (Signed)
Summary: [facility]: RX  [facility]: [id number]   Imported By: Bonner Puna 06/20/2010 09:07:50  _____________________________________________________________________  External Attachment:    Type:   Image     Comment:   External Document

## 2010-08-03 NOTE — Consult Note (Signed)
Summary: [facility]  [facility]   Imported By: Bonner Puna 05/10/2010 11:32:06  _____________________________________________________________________  External Attachment:    Type:   Image     Comment:   External Document

## 2010-08-03 NOTE — Consult Note (Signed)
Summary: [facility]  [facility]   Imported By: Bonner Puna 05/31/2010 15:06:35  _____________________________________________________________________  External Attachment:    Type:   Image     Comment:   External Document

## 2010-08-03 NOTE — Progress Notes (Signed)
Summary: Pull [facility]IC and start Septra DS  Phone Note Call from Patient Call back at Home Phone [phone]   Caller: Patient Call For: Rhina Brackett Dam MD Reason for Call: Talk to Nurse, Talk to Doctor Summary of Call: Pt. left message the her "[facility]."  She has only had three weeks of vancomycin and wants to know what can be done.  RN spoke with Dr. Tommy Medal.  Order obtained to pull the [facility] line 06/17/10 and start Septra DS. 2 tablets daily for 21 days.  Lorne Skeens RN  June 16, 2010 10:32 AM Phone call to [facility].  Given order to pull the [facility] line.  Pt. to start Septra DS as soon as she can pick it up from [facility] later on today. Lorne Skeens RN  June 16, 2010 10:43 AM    Follow-up for Phone Call        Clelia Schaumann Follow-up by: Rhina Brackett Dam MD,  June 16, 2010 1:36 PM    New/Updated Medications: SEPTRA DS 800-160 MG TABS (SULFAMETHOXAZOLE-TRIMETHOPRIM) Take 1 tablet by mouth two times a day Prescriptions: SEPTRA DS 800-160 MG TABS (SULFAMETHOXAZOLE-TRIMETHOPRIM) Take 1 tablet by mouth two times a day  #42 x 0   Entered by:   Lorne Skeens RN   Authorized by:   Alcide Evener MD   Signed by:   Lorne Skeens RN on 06/16/2010   Method used:   Electronically to        [facility] (retail)       [address]       [place], [place]  [address]       Ph: [phone]       Fax: [phone]   [id number] SEPTRA DS 800-160 MG TABS (SULFAMETHOXAZOLE-TRIMETHOPRIM) Take 1 tablet by mouth two times a day  #42 x 0   Entered by:   Lorne Skeens RN   Authorized by:   Alcide Evener MD   Signed by:   Lorne Skeens RN on 06/16/2010   Method used:   Print then Give to Patient   RxID:   [id number]

## 2010-08-03 NOTE — Assessment & Plan Note (Signed)
Summary: fukam   Visit Type:  Follow-up Referring Provider:  Harlow Mares Primary Provider:  Vedia Coffer  CC:  f/u .  History of Present Illness: 55 year old with CAD, chronic pain, neuropathy,  problems with plantar .fascitis sp steroid injections, later arthrodesis  of great to and IP joint hallux on right with hardware by Dr Barkley Bruns from [facility]. Patient developed severe swelling several months ago and had surgery on 03/01/10 wtih removal of hardware and insertion of new wires? per pt. At that time culture was obtained which pt claims was from bone. Cultures grew MRSA from this site R to clindamycin, S to levaquin, gent, vancomycin, tmp/smx, linezolid, rifampin, tetracycline,  She had been started on augmentin and then was changed over to IV vancomcin and given a  6 wk course. Films in tinerim had shown movement of wires. Pt had "k wires" removed fon 03/10/10. Pt finished course of IV vancomycin and then was changed to one ds bactrim two times a day with rifampin. She has continued to have swelling and persistent osteomyelitis. She was referred to [place] by Dr. Barkley Bruns as an urgent consult. I ordered CT scan at last appt and this showedextensive fragmentation of the proximalphalangeal bone of the great toe with pathologic fractures,secondary to chronic osteomyelitis.  There is also destruction ofthe base of the distal phalangeal bone of the great toe consistentwith chronic osteomyelitis. I referred her to  Dr Rhona Raider who performed amputaito of the toe. THe cultures from operative site have faield to grow ny orgnaims. She was on IV vancomycin but the 2nd PICC line failed and I changed her to bactrim (thought dose was supose to be 2 ds two times a day_ She has felt relatively well and is adjusting to walking now ith tennis shoes. She is disturbed slighlty by the darker pigmentedation of her skin in the distal foot.   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    Fibromyalgia.  Chronic pain with morphine pump Neuropathy  Past Surgical History: Last updated: 04/18/2010  Lumbar surgery x 3  Status post cholecystectomy.   Family History: Last updated: 04/18/2010 Mother diseased from lung cancer Dad had CAD, DM,   Social History: Last updated: 04/18/2010 former smoker, no alcohol, no drugs, same sex partner  Family History: Reviewed history from 04/18/2010 and no changes required. Mother diseased from lung cancer Dad had CAD, DM,   Social History: Reviewed history from 04/18/2010 and no changes required. former smoker, no alcohol, no drugs, same sex partner  Additional History Menstrual Status:  postmenopausal  Review of Systems       The patient complains of abdominal pain and suspicious skin lesions.  The patient denies anorexia, fever, weight loss, weight gain, vision loss, decreased hearing, hoarseness, chest pain, syncope, dyspnea on exertion, peripheral edema, prolonged cough, headaches, hemoptysis, melena, hematochezia, severe indigestion/heartburn, hematuria, incontinence, genital sores, muscle weakness, transient blindness, difficulty walking, depression, unusual weight change, abnormal bleeding, enlarged lymph nodes, angioedema, breast masses, and testicular masses.    Vital Signs:  Patient profile:   55 year old female Menstrual status:  postmenopausal Height:      70 inches (177.80 cm) Weight:      234 pounds (106.36 kg) BMI:     33.70 Temp:  98.5 degrees F (36.94 degrees C) oral Pulse rate:   96 / minute BP sitting:   138 / 73  (left arm)  Vitals Entered By: Jarrett Ables CMA (June 20, 2010 2:08 PM) CC: f/u  Is Patient Diabetic? No Pain Assessment Patient in pain? yes     Location: right ankle Intensity:  5 Type: aching Nutritional Status BMI of > 30 = obese  Does patient need assistance? Functional Status Self care Ambulation Normal     Menstrual Status postmenopausal   Physical Exam  General:  alert, well-developed, and well-nourished.   Head:  normocephalic and atraumatic.   Eyes:  vision grossly intact, pupils equal, and pupils round.   Ears:  no external deformities.   Nose:  no external deformity.   Mouth:  pharynx pink and moist, no erythema, and no exudates.   Neck:  supple and full ROM.   Lungs:  normal respiratory effort, no crackles, and no wheezes.   Heart:  normal rate, regular rhythm, no murmur, and no gallop.   Abdomen:  soft and non-tender.   Msk:  see ext Extremities:  her amputation site appears clean and healthy. She does have very slight cyanotic hue to her distal skin which per her has been stable Neurologic:  alert & oriented X3 and strength normal in all extremities.   Skin:  see ext Psych:  Oriented X3, memory intact for recent and remote, and normally interactive.     Impression & Recommendations:  Problem # 1:  CHRONIC OSTEOMYELITIS ANKLE AND FOOT (ICD-730.17)  sp amputation. Continue the 2ds bactrim two times a day. She has fu with Dr. Rhona Raider who was not concerned by the appearnce of her sking. I was not overly worried myself. will contineut o monitor Her updated medication list for this problem includes:    Oxycodone-acetaminophen 5-325 Mg Tabs (Oxycodone-acetaminophen) .Marland Kitchen... Take 1 tablet three times daily as needed for pain    Aspirin 81 Mg Tabs (Aspirin) .Marland Kitchen... Take 1 tablet by mouth daily    Septra Ds 800-160 Mg Tabs (Sulfamethoxazole-trimethoprim) .Marland Kitchen... Take two tablets twice daily for 24 more days  Orders: Est. Patient Level IV [id number])  Problem # 2:  MRSA [id number])  is believed to bhe the culprit in her chronic osteo (see above)  Orders: Est. Patient Level IV [id number])  Problem # 3:  NEUROPATHY (ICD-355.9)  has been worked up by  PCP but not clear what cause is here  Orders: Est. Patient Level IV [id number])  Medications Added to Medication List This Visit: 1)  Septra Ds 800-160 Mg Tabs (Sulfamethoxazole-trimethoprim) .... Take two tablets twice daily for 24 more days  Patient Instructions: 1)  rtc to see Dr Tommy Medal in February Prescriptions: SEPTRA DS 800-160 MG TABS (SULFAMETHOXAZOLE-TRIMETHOPRIM) take TWO tablets twice daily for 24 more days  #96 x 1   Entered and Authorized by:   Alcide Evener MD   Signed by:   Alcide Evener MD on 06/20/2010   Method used:   Electronically to        [facility] (retail)       [address]       [place], [place]  [address]       Ph: [phone]       Fax: [phone]   RxID:   [id number]

## 2010-08-03 NOTE — Progress Notes (Signed)
Summary: foot pain  Phone Note Call from Patient   Caller: Patient Call For: Rhina Brackett Dam MD Summary of Call: Patient called stating that the fron of her right foot was red, swollen, and warm to the touch. The patient denies having a fever, and states that here second toe is slihgtly bent. Wants to see Dr. Tommy Medal earlier than Feb 20 Initial call taken by: Jarrett Ables CMA,  July 25, 2010 10:42 AM  Follow-up for Phone Call        We could work her in tomorrow maybe.  Follow-up by: Alcide Evener MD,  July 25, 2010 1:23 PM  Additional Follow-up for Phone Call Additional follow up Details #1::        ok i will call the patient to make an appt Additional Follow-up by: Jarrett Ables CMA,  July 25, 2010 2:26 PM    Additional Follow-up for Phone Call Additional follow up Details #2::    thanks [employer] Follow-up by: Alcide Evener MD,  July 25, 2010 3:09 PM

## 2010-08-03 NOTE — Progress Notes (Signed)
Summary: office note faxed to [facility]  Phone Note From Other Clinic   Caller: [facility] Summary of Call: [facility] called and requested office note faxed.  Michela Pitcher they had originally referred pt.  Note faxed Initial call taken by: Myrtis Hopping CMA Deborra Medina),  April 28, 2010 2:11 PM

## 2010-08-23 NOTE — Consult Note (Signed)
Summary: [facility]   Imported By: Bonner Puna 08/14/2010 18:11:48  _____________________________________________________________________  External Attachment:    Type:   Image     Comment:   External Document

## 2010-09-13 LAB — BASIC METABOLIC PANEL
BUN: 17 mg/dL (ref 6–23)
CO2: 34 mEq/L — ABNORMAL HIGH (ref 19–32)
Calcium: 9.2 mg/dL (ref 8.4–10.5)
Chloride: 101 mEq/L (ref 96–112)
Creatinine, Ser: 0.85 mg/dL (ref 0.4–1.2)
GFR calc Af Amer: >60 mL/min (ref >60)
GFR calc non Af Amer: >60 mL/min (ref >60)
Glucose, Bld: 86 mg/dL (ref 70–99)
Potassium: 4.4 mEq/L (ref 3.5–5.1)
Sodium: 139 mEq/L (ref 135–145)

## 2010-09-13 LAB — AFB CULTURE WITH SMEAR (NOT AT ARMC): Acid Fast Smear: NONE SEEN

## 2010-09-13 LAB — TISSUE CULTURE
Culture: NO GROWTH
Gram Stain: NONE SEEN

## 2010-09-13 LAB — CBC
HCT: 33.6 % — ABNORMAL LOW (ref 36.0–46.0)
HCT: 35.7 % — ABNORMAL LOW (ref 36.0–46.0)
Hemoglobin: 11.1 g/dL — ABNORMAL LOW (ref 12.0–15.0)
Hemoglobin: 11.9 g/dL — ABNORMAL LOW (ref 12.0–15.0)
MCH: 28.9 pg (ref 26.0–34.0)
MCH: 29.1 pg (ref 26.0–34.0)
MCHC: 33 g/dL (ref 30.0–36.0)
MCHC: 33.3 g/dL (ref 30.0–36.0)
MCV: 87.3 fL (ref 78.0–100.0)
MCV: 87.5 fL (ref 78.0–100.0)
Platelets: 155 10*3/uL (ref 150–400)
Platelets: 185 10*3/uL (ref 150–400)
RBC: 3.84 MIL/uL — ABNORMAL LOW (ref 3.87–5.11)
RBC: 4.09 MIL/uL (ref 3.87–5.11)
RDW: 13.3 % (ref 11.5–15.5)
RDW: 13.5 % (ref 11.5–15.5)
WBC: 6.4 10*3/uL (ref 4.0–10.5)
WBC: 6.4 10*3/uL (ref 4.0–10.5)

## 2010-09-13 LAB — FUNGUS CULTURE W SMEAR: Fungal Smear: NONE SEEN

## 2010-09-13 LAB — C-REACTIVE PROTEIN: CRP: 0.5 mg/dL — ABNORMAL LOW (ref <0.6)

## 2010-09-13 LAB — ANAEROBIC CULTURE: Gram Stain: NONE SEEN

## 2010-09-13 LAB — SURGICAL PCR SCREEN
MRSA, PCR: NEGATIVE
Staphylococcus aureus: NEGATIVE

## 2010-09-18 ENCOUNTER — Encounter: Payer: Self-pay | Admitting: Infectious Disease

## 2010-09-18 ENCOUNTER — Ambulatory Visit (INDEPENDENT_AMBULATORY_CARE_PROVIDER_SITE_OTHER): Payer: Medicare Other | Admitting: Infectious Disease

## 2010-09-18 DIAGNOSIS — A4902 Methicillin resistant Staphylococcus aureus infection, unspecified site: Secondary | ICD-10-CM

## 2010-09-18 DIAGNOSIS — M25579 Pain in unspecified ankle and joints of unspecified foot: Secondary | ICD-10-CM | POA: Insufficient documentation

## 2010-09-18 DIAGNOSIS — M86679 Other chronic osteomyelitis, unspecified ankle and foot: Secondary | ICD-10-CM

## 2010-09-18 LAB — CONVERTED CEMR LAB
BUN: 17 mg/dL (ref 6–23)
Basophils Absolute: 0 10*3/uL (ref 0.0–0.1)
Basophils Relative: 1 % (ref 0–1)
CO2: 32 meq/L (ref 19–32)
CRP: 0.2 mg/dL (ref <0.6)
Calcium: 9.9 mg/dL (ref 8.4–10.5)
Chloride: 101 meq/L (ref 96–112)
Creatinine, Ser: 1 mg/dL (ref 0.40–1.20)
Eosinophils Absolute: 0.2 10*3/uL (ref 0.0–0.7)
Eosinophils Relative: 3 % (ref 0–5)
Glucose, Bld: 107 mg/dL — ABNORMAL HIGH (ref 70–99)
HCT: 41.5 % (ref 36.0–46.0)
Hemoglobin: 13.4 g/dL (ref 12.0–15.0)
Lymphocytes Relative: 34 % (ref 12–46)
Lymphs Abs: 2.2 10*3/uL (ref 0.7–4.0)
MCHC: 32.3 g/dL (ref 30.0–36.0)
MCV: 89.6 fL (ref 78.0–100.0)
Monocytes Absolute: 0.5 10*3/uL (ref 0.1–1.0)
Monocytes Relative: 7 % (ref 3–12)
Neutro Abs: 3.7 10*3/uL (ref 1.7–7.7)
Neutrophils Relative %: 55 % (ref 43–77)
Platelets: 234 10*3/uL (ref 150–400)
Potassium: 4.7 meq/L (ref 3.5–5.3)
RBC: 4.63 M/uL (ref 3.87–5.11)
RDW: 13 % (ref 11.5–15.5)
Sed Rate: 16 mm/hr (ref 0–22)
Sodium: 143 meq/L (ref 135–145)
WBC: 6.6 10*3/uL (ref 4.0–10.5)

## 2010-09-28 NOTE — Assessment & Plan Note (Signed)
Summary: F/U OV/VS   Vital Signs:  Patient profile:   55 year old female Menstrual status:  postmenopausal Height:      70 inches (177.80 cm) Weight:      237.75 pounds (108.07 kg) BMI:     34.24 Temp:     98.6 degrees F (37.00 degrees C) oral Pulse rate:   76 / minute BP sitting:   115 / 72  (left arm)  Vitals Entered By: Jarrett Ables CMA (September 18, 2010 11:10 AM) CC: f/u toe infection Is Patient Diabetic? Yes Did you bring your meter with you today? No Pain Assessment Patient in pain? yes     Location: rt foot Intensity: 3 Type: aching Nutritional Status BMI of > 30 = obese Nutritional Status Detail nl  Does patient need assistance? Functional Status Self care Ambulation Normal   Visit Type:  Follow-up Referring Provider:  Harlow Mares Primary Provider:  Vedia Coffer  CC:  f/u toe infection.  History of Present Illness: 55 year old with CAD, chronic pain, neuropathy,  problems with plantar .fascitis sp steroid injections, later arthrodesis  of great to and IP joint hallux on right with hardware by Dr Barkley Bruns from [facility]. Patient developed severe swelling several months ago and had surgery on 03/01/10 wtih removal of hardware and insertion of new wires? per pt. At that time culture was obtained which pt claims was from bone. Cultures grew MRSA from this site R to clindamycin, S to levaquin, gent, vancomycin, tmp/smx, linezolid, rifampin, tetracycline,  She had been started on augmentin and then was changed over to IV vancomcin and given a  6 wk course. Films in tinerim had shown movement of wires. Pt had "k wires" removed fon 03/10/10. Pt finished course of IV vancomycin and then was changed to one ds bactrim two times a day with rifampin. She has continued to have swelling and persistent osteomyelitis. She was referred to [place] by Dr. Barkley Bruns as an urgent consult. I ordered CT scan at last appt and this showedextensive fragmentation of the proximalphalangeal  bone of the great toe with pathologic fractures,secondary to chronic osteomyelitis.  There is also destruction ofthe base of the distal phalangeal bone of the great toe consistentwith chronic osteomyelitis. I referred her to  Dr Rhona Raider who performed amputaito of the toe. THe cultures from operative site have faield to grow ny orgnaims. She was on IV vancomycin but the 2nd PICC line failed and I changed her to bactrim. She went off abx then had increased drainage and was put back on thru February. Since then she has beee   off antibiotics for one and half months. Toe is doing well. Pain in the ankles and the bottom of the heel ont the right side. This has worsened recently. She has no fevers chills or other symptoms. She brought disk with plain films from orthopedists office.  Problems Prior to Update: 1)  Mrsa  (ICD-041.12) 2)  Fever, Hx of  (ICD-V15.9) 3)  Neuropathy  (ICD-355.9) 4)  Chronic Pain Syndrome  (ICD-338.4) 5)  Chronic Osteomyelitis Ankle and Foot  (ICD-730.17)  Medications Prior to Update: 1)  Crestor 20 Mg Tabs (Rosuvastatin Calcium) .... Take 1 Tablet By Mouth Daily 2)  Nuvigil 250 Mg Tabs (Armodafinil) .... Take 1 Tablet Daily As Needed For Sleepiness 3)  Flecainide Acetate 50 Mg Tabs (Flecainide Acetate) .... Take 1 Tablet By Mouth Every 12 Hours 4)  Zetia 10 Mg Tabs (Ezetimibe) .... Take 1 Tablet By Mouth Daily  5)  Oxycodone-Acetaminophen 5-325 Mg Tabs (Oxycodone-Acetaminophen) .... Take 1 Tablet Three Times Daily As Needed For Pain 6)  Tizanidine Hcl 4 Mg Tabs (Tizanidine Hcl) .... Take One Tablet By Mouth 3 Times Daily 7)  Lyrica 75 Mg Caps (Pregabalin) .... Take One Capsule By Mouth Every Morning and Two Capsules (150mg ) By Mouth Every Night At Bedtime 8)  Paroxetine Hcl 20 Mg Tabs (Paroxetine Hcl) .... Take 0.5 Tablet By Mouth Twice Daily 9)  Aspirin 81 Mg Tabs (Aspirin) .... Take 1 Tablet By Mouth Daily  Current Medications (verified): 1)  Crestor 20 Mg Tabs  (Rosuvastatin Calcium) .... Take 1 Tablet By Mouth Daily 2)  Nuvigil 250 Mg Tabs (Armodafinil) .... Take 1 Tablet Daily As Needed For Sleepiness 3)  Flecainide Acetate 50 Mg Tabs (Flecainide Acetate) .... Take 1 Tablet By Mouth Every 12 Hours 4)  Zetia 10 Mg Tabs (Ezetimibe) .... Take 1 Tablet By Mouth Daily 5)  Oxycodone-Acetaminophen 5-325 Mg Tabs (Oxycodone-Acetaminophen) .... Take 1 Tablet Three Times Daily As Needed For Pain 6)  Tizanidine Hcl 4 Mg Tabs (Tizanidine Hcl) .... Take One Tablet By Mouth 3 Times Daily 7)  Lyrica 75 Mg Caps (Pregabalin) .... Take One Capsule By Mouth Every Morning and Two Capsules (150mg ) By Mouth Every Night At Bedtime 8)  Paroxetine Hcl 20 Mg Tabs (Paroxetine Hcl) .... Take 0.5 Tablet By Mouth Twice Daily 9)  Aspirin 81 Mg Tabs (Aspirin) .... 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    Fibromyalgia.  Chronic pain with morphine pump Neuropathy  Past Surgical History: Last updated: 04/18/2010  Lumbar surgery x 3  Status post cholecystectomy.   Family History: Last updated: 04/18/2010 Mother diseased from lung cancer Dad had CAD, DM,   Social History: Last updated: 04/18/2010 former smoker, no alcohol, no drugs, same sex partner  Review of Systems       see HPI otherwise negative on 39 pt review of systems  Physical Exam  General:  alert, well-developed, and well-nourished.   Head:  normocephalic and atraumatic.   Eyes:  vision grossly intact, pupils equal, and pupils round.   Ears:  no external deformities.   Mouth:  pharynx pink and moist, no erythema, and no exudates.   Neck:  supple and full ROM.   Lungs:  normal respiratory effort, no crackles, and no wheezes.   Heart:  normal rate, regular rhythm, no  murmur, and no gallop.   Abdomen:  soft and non-tender.   Msk:  see ext Extremities:  her amputation site appears clean and healthy. there is no flucuance or tenderness. She does have some tenderness about her ankle joint and limitations in flexion extenstion there Neurologic:  alert & oriented X3 and strength normal in all extremities.   Skin:  see ext Psych:  Oriented X3, memory intact for recent and remote, and normally interactive.     Impression & Recommendations:  Problem # 1:  CHRONIC OSTEOMYELITIS ANKLE AND FOOT (ICD-730.17) sp amputation doing well observe off abx Her updated medication list for this problem includes:    Oxycodone-acetaminophen 5-325 Mg Tabs (Oxycodone-acetaminophen) .Marland Kitchen... Take 1 tablet three times daily as needed for pain    Aspirin 81 Mg Tabs (Aspirin) .Marland Kitchen... Take 1 tablet by mouth daily  Orders: T-Basic  Metabolic Panel (99991111) T-C-Reactive Protein [phone]) T-Sed Rate (Automated) [phone]) T-CBC w/Diff [id number]) Est. Patient Level IV [id number])  Problem # 2:  ANKLE PAIN (ICD-719.47)  doubt this is infectious. Have asked her to see Dr. Rhona Raider re this  Orders: Est. Patient Level IV [id number])  Problem # 3:  MRSA (ICD-041.12)  did decontamination regimen  Orders: Est. Patient Level IV [id number])  Patient Instructions: 1)  I will take a look at your plain fiilms 2)  I would make fu appt with Dr Rhona Raider 3)  We will check some blood work today 4)  stay off the antibiotics 5)  Rtc to see Dr Tommy Medal as needed   Orders Added: 1)  T-Basic Metabolic Panel 0000000 2)  T-C-Reactive Protein [id number] 3)  T-Sed Rate (Automated) [id number] 4)  T-CBC w/Diff [id number] 5)  Est. Patient Level IV [id number]

## 2010-11-14 NOTE — Discharge Summary (Signed)
NAMECANELA, HACH NO.:  [id number]   MEDICAL RECORD NO.:  [id number]          PATIENT TYPE:  INP   LOCATION:  4741                         FACILITY:  [facility]   PHYSICIAN:  Belva Crome, M.D.   DATE OF BIRTH:  10/21/1955   DATE OF ADMISSION:  02/19/2008  DATE OF DISCHARGE:                               DISCHARGE SUMMARY   DISCHARGE DIAGNOSES:  1. Chest pain, relieved.  2. History of atrioventricular nodal reentrant tachycardia with      unsuccessful ablation therapy in 1992.  3. Fibromyalgia.  4. Long-term medication use.  5. Lumbar surgery.  6. Status post cholecystectomy.   HOSPITAL COURSE:  [name] is a 55 year old female who developed left-  sided chest discomfort 2 days prior to admission while riding a bike.  She states that the discomfort felt like a punch and it radiated into  the left side of her neck with associated shortness of breath.  When she  stopped to rest, her symptoms subsided.  She was then admitted to the  hospital for further evaluation.  Her EKG shows normal sinus rhythm rate  73 with nonspecific ST-T wave changes inferolaterally.  She has T-wave  inversion in V1 and V2.   Lab studies during her hospital stay included cardiac isoenzymes, which  were negative.  Total cholesterol 263, triglycerides 678, HDL 28, LDL  incalculable.  Magnesium 2.0, sodium 140, potassium 4.0, BUN 11,  creatinine 0.93, glucose 155.  BNP less than 30.  PT was 13.3, INR 1.0.   She is discharged to home in stable condition.  We are planning for her  to have an outpatient Lexiscan Cardiolite by Dr. Leonia Reeves next week,  February 26, 2008, at 9:15 a.m.  She will have no new medication changes.   Her medications are as follows:  Tambocor 50 mg twice a day, Paxil 40 mg  a day, Zanaflex 4 mg twice a day, Lyrica 150 mg t.i.d.  She is on a  morphine pump, which she will continue.  We did give her prescription  for sublingual nitroglycerin as needed for pain.   She is to remain on low-sodium heart-healthy diet.  Increase activity  slowly.  Follow up as instructed.  We will mail her information  regarding the stress test that is scheduled.      Joesphine Bare, P.A.      Belva Crome, M.D.  Electronically Signed   LB/MEDQ  D:  02/20/2008  T:  02/21/2008  Job:  [id number]

## 2010-11-14 NOTE — Cardiovascular Report (Signed)
NAMEAMBELLINA, Melinda [name]   MEDICAL RECORD NO.:  [id number]          PATIENT TYPE:  INP   LOCATION:  6527                         FACILITY:  [facility]   PHYSICIAN:  Belva Crome, M.D.   DATE OF BIRTH:  1956-03-13   DATE OF PROCEDURE:  02/20/2008  DATE OF DISCHARGE:                            CARDIAC CATHETERIZATION   INDICATION FOR PROCEDURE:  Recurring substernal chest pressure with risk  factors for coronary artery disease including hyperlipidemia and  cigarette smoking.   PROCEDURE PERFORMED:  1. Left heart catheterization.  2. Selective coronary angiography.  3. Left ventriculography.  4. Intravascular ultrasound.  5. Drug-eluting PROMUS left anterior descending stent.  6. Angio-Seal.   DESCRIPTION:  After informed consent, the patient was brought to the  Cath Lab where she was given 1 mg of IV Versed.  After intravenous  Versed, a 6-French sheath was placed in the right femoral artery using  the modified Seldinger technique.  Xylocaine 1% was used for local  anesthesia.   A 6-French B2 multipurpose catheter was then used for diagnostic  coronary angiography.  The left ventriculogram and left coronary artery  angiogram and hemodynamic recordings were done with this catheter.  A 6-  [place] #4 right Judkins catheter was used for right coronary  angiography.  Reviewing the angiograms, we demonstrated the presence of  a severe stenosis in the LAD.  After some discussion, intravascular  ultrasound was performed.  Angiomax bolus followed by an infusion was  used to allow intravascular ultrasound.  IVUS demonstrated a significant  lesion with soft plaque.  Distal reference diameter was around 2.9 x  2.8.  Proximal reference diameter was 3.5 x 3.25.  There was a focal  area of soft plaque that was nearly obstructive with a cross-sectional  area of 2.8 mm square.  After documenting this, we felt that PCI should  be performed.   We used [employer] that we used for IVUS to perform angioplasty.  We  also used a [employer] to protect the large first diagonal.  After IVUS, we  realized that the lesion was soft and felt comfortable performing a  direct stenting.  We directly stented using a 3.0 x 15 PROMUS stent.  We  performed one balloon inflation to 10 atmospheres and a second balloon  inflation to 10 atmospheres.  Total balloon time 70 seconds.  We then  performed an 11 atmosphere inflation with a 3.25 x 12-mm long Quantum  Maverick balloon.  The procedure was then terminated and adequate  visualization of the LAD demonstrated significant reduction in the  stenosis with patency and preservation of the side branch.   Angio-Seal was used for hemostasis.   ACT had been documented to be greater than 300 seconds prior to wire  manipulation.   RESULTS:  1. Hemodynamic data:      a.     Left ventricular pressure 118/7.      b.     Aortic pressure 106/71.  2. Left ventriculography:  Left ventricular cavity size and function      are normal, EF  is 70%.  No MR.  3. Coronary angiography.      a.     Left main coronary artery:  Normal.  There was a slight       upward origin of the left main from the left sinus of Valsalva.      b.     Left anterior descending coronary artery:  This was a large       vessel that wraps around the left ventricular apex.  The LAD gives       origin to two diagonal branches.  The second diagonal was a larger       of the two.  The mid LAD contains a region of systolic compression       after the second diagonal.  Flow in the distal LAD is somewhat       sluggish.  There was a very focal eccentric 80% stenosis in the       LAD just distal to the large second diagonal.  After intracoronary       nitroglycerin, the region of systolic compression was less       noticeable in the LAO cranial views.      c.     Circumflex artery:  The circumflex coronary artery is       codominant.  It gives origin to a large  trifurcating first obtuse       marginal and a smaller obtuse marginal branch distally.      d.     Right coronary artery:  The right coronary artery is a large       vessel, large PDA, large left ventricular branch.  4. Percutaneous coronary intervention.      a.     Intravascular ultrasound:  Demonstrated the focal index       lesion to measure less than 2.8 mm square in cross-sectional area       with a  proximal reference diameter of 3.1 x 3.25 and a distal       reference diameter of 2.9 x 2.8.      b.     Direct stenting of the 80% lesion with a 15 x 3.0 PROMUS led       to 0% stenosis with TIMI grade 3 flow.  Transiently, the mid       region of systolic compression appeared worse but after removing       the wire, it was felt that a good final result had been achieved.   CONCLUSION:  1. Severe focal mid LAD discrete stenosis reduced from 80% to 0% with      TIMI grade 3 flow using a PROMUS drug-eluting stent.  2. Left anterior descending coronary artery mid vessel systolic      compression/bridging.  3. Normal RCA and circumflex.  4. Normal LV function.   PLAN:  Aspirin and Plavix x1 year.  Hopeful discharge in a.m.      Belva Crome, M.D.  Electronically Signed     HWS/MEDQ  D:  02/20/2008  T:  02/21/2008  Job:  [id number]   cc:   Barnett Abu, M.D.

## 2010-11-14 NOTE — H&P (Signed)
NAMEMIELA, BROMAN NO.:  [id number]   MEDICAL RECORD NO.:  [id number]          PATIENT TYPE:  INP   LOCATION:  4741                         FACILITY:  [facility]   PHYSICIAN:  Belva Crome, M.D.   DATE OF BIRTH:  [birth year]   DATE OF ADMISSION:  02/19/2008  DATE OF DISCHARGE:                              HISTORY & PHYSICAL   CHIEF COMPLAINT:  Chest pain.   HISTORY OF PRESENT ILLNESS:  Melinda [name] is a 55 year old female  patient of Dr. Rodell Perna who developed left-sided chest discomfort 2  days ago while out on a hike.  States discomfort felt like a punch. It  was somewhat pressure-like in character and was a 9 on a scale of 1-10.  It radiated up to the left side of her neck.  She had associated  shortness of breath as well as sweats, but no nausea.  She stopped to  rest and the symptoms subsided within 15 minutes.  The patient had  another episode of discomfort last night, although not as severe.  It  lasted 1-2 minutes at a time, it went on for about 10 minutes total.  She was not potentially short of breath with that episode.  This  morning, when she got up, she began having pressure in the left chest  again associated with shortness of breath.  It continued until she was  seen in the office and had one nitroglycerin spray.  The symptoms  subsided within 5 minutes.  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  She had an unremarkable stress test in February 2002.  She does have a  family history of coronary artery disease and recently quit smoking  after smoking for 30 years.  She is being admitted to rule out acute  coronary syndrome.   REVIEW OF SYSTEMS:  As above.  No recent weight loss or gain.  No fever  or chills or recent illness.  HEENT: No problems with vision, hearing,  epistaxis, nasal congestion, sore throat.  Neck as above.  No stiffness.  CHEST:  Denies PND, orthopnea.   No cough or wheezing.  CARDIOVASCULAR:  As above.  Denies palpitations, tachyarrhythmias.  The discomfort is not  pleuritic.  GI: No nausea, vomiting, abdominal pain, black stools,  diarrhea, constipation.  GU: No dysuria, urinary frequency, hematuria,  nocturia.  MUSCULOSKELETAL: Chronic low back pain with multiple lumbar  surgeries in the past.  Has implantable morphine pump for pain. Denies  any unusual joint stiffness or swelling.  NEURO:  No numbness tingling,  headache, seizures, memory loss dizziness, lightheadedness or syncope.   PAST MEDICAL HISTORY:  1. History of AVNRT, unsuccessful attempted ablative therapy, 1992.  2. Non-anginal chest pain, normal Cardiolite both March 2002.  3. Fibromyalgia.  4. Chronic low back pain  5. Peripheral neuropathy.   PAST SURGICAL HISTORY:  1. Lumbar surgeries times three.  2. Cholecystectomy.  3. Multiple orthopedic surgeries.   CURRENT MEDICATIONS:  1. Paxil 40 mg a day.  2. Wellbutrin XL 300  mg daily.  3. Flecainide 50 mg twice daily.  4. Zanaflex 4 mg twice a day.  5. Black cohosh 40 mg a day.  6. Vitamin C daily.  7. Multivitamin daily.  8. Calcium, magnesium, zinc daily.  9. Nasonex spray daily.  10.Implantable morphine pump continuously.  11.Lyrica, question dose, one in the morning, two in the evening.   DRUG ALLERGIES:  None known.   SOCIAL HISTORY:  Quit smoking 3 weeks ago after three-quarter pack a day  history for 28 years.  Not married.  Lives with partner who is active in  her care.  No alcohol, no illicit drug use.   FAMILY HISTORY:  Father had an MI in his mid 33s.  Had stents placed at  that time.  Maternal grandfather had a history of MI later on in age.  Has a brother age 37, who is healthy.   OBJECTIVE DATA:  Weight 224, pulse 88, blood pressure is 110/90.  GENERAL:  Very pleasant, no acute distress.  SKIN:  Warm and dry.  HEENT: Unremarkable.  NECK: No JVD.  No carotid bruits are auscultated.  No  adenopathy.  CHEST:  Good excursion, clear throughout without rales, wheezing or  rhonchi.  HEART: Normal S1-S2 without murmur, rub, and no extra systole.  ABDOMEN: Soft, nondistended.  Bowel sounds are present.  She has  incision into the right lower abdominal area with a morphine pump  implantation.  There is no bruit auscultated.  No pulsatile mass.  EXTREMITIES: Reveals no clubbing, cyanosis or edema,  +2 distal pulses.   EKG reveals normal sinus rhythm with T-wave inversions in leads V1 and  V2.  This is unchanged from prior.   IMPRESSION:  1. Chest pain, rule out cardiac.  2. History of AV nodal re-entrant tachycardia, controlled on      flecainide.  3. Chronic low back pain with implantable morphine pump.  4. Fibromyalgia.  5. Peripheral neuropathy.   PLAN:  Will admit to telemetry bed.  Obtain cardiac markers.  Further  ischemic workup depending on cardiac markers.  Dr. Tamala Julian to follow.      Tamera C. Lewis, N.P.      Belva Crome, M.D.  Electronically Signed    TCL/MEDQ  D:  02/19/2008  T:  02/19/2008  Job:  [id number]

## 2010-11-17 NOTE — Op Note (Signed)
[facility]  Patient:    Melinda [name], Melinda [name]                       MRN: [id number] Adm. Date:  [id number] Attending:  Carlisle Cater                           Operative Report  PREOPERATIVE DIAGNOSIS:  Locking interphalangeal joint, left thumb.  POSTOPERATIVE DIAGNOSIS:  Locking interphalangeal joint, left thumb.  PROCEDURE:  Arthrotomy and exploration of interphalangeal joint, left thumb.  SURGEON:  Daryl Eastern, M.D.  ANESTHESIA:  General.  OPERATIVE FINDINGS:  The patient had some very mild degenerative changes at the IP joint.  There was no evidence of significant synovitis, and there were no loose bodies present in the joint.  DESCRIPTION OF PROCEDURE:  Under general anesthesia with a tourniquet on the left arm, the left hand was prepped and draped in the usual fashion, and after exsanguinating the limb, the tourniquet was inflated to 250 mmHg.  Marcaine 0.5% digital block was inserted for postoperative pain control.  A transverse incision was made over the dorsum of the IP joint of the left thumb.  Bleeding was controlled with electrocautery.  Blunt dissection was carried down to the extensor tendon, and the joint was opened dorsal radially and dorsal ulnarly. Synovial tissue was sampled and sent to pathology for evaluation.  The Freer elevator was then placed in the joint, and the articular surface was found to be mildly degenerated.  There were no loose bodies that were identified, and there was not any unusual appearance to the synovial fluid.  The joint was then irrigated out with saline, and the skin was closed with 4-0 subcuticular Prolene.  Steri-Strips were applied, followed by sterile dressings.  The patient tolerated the procedure well.  The tourniquet was released, and she had good circulation of the thumb.  She went to the recovery room awake and stable, in good condition. DD:  11/06/00 TD:  11/07/00 Job:  [id number]

## 2011-02-27 ENCOUNTER — Ambulatory Visit (HOSPITAL_COMMUNITY)
Admission: RE | Admit: 2011-02-27 | Discharge: 2011-02-27 | Disposition: A | Discharge status: Home / Self Care | Payer: Medicare Other | Source: Ambulatory Visit | Attending: Cardiology | Admitting: Cardiology

## 2011-02-27 DIAGNOSIS — I2510 Atherosclerotic heart disease of native coronary artery without angina pectoris: Secondary | ICD-10-CM | POA: Insufficient documentation

## 2011-02-27 DIAGNOSIS — R61 Generalized hyperhidrosis: Secondary | ICD-10-CM | POA: Insufficient documentation

## 2011-02-27 DIAGNOSIS — Z9861 Coronary angioplasty status: Secondary | ICD-10-CM | POA: Insufficient documentation

## 2011-02-27 DIAGNOSIS — R079 Chest pain, unspecified: Secondary | ICD-10-CM | POA: Insufficient documentation

## 2011-02-27 DIAGNOSIS — IMO0001 Reserved for inherently not codable concepts without codable children: Secondary | ICD-10-CM | POA: Insufficient documentation

## 2011-02-27 HISTORY — PX: LEFT HEART CATH AND CORONARY ANGIOGRAPHY: CATH118249

## 2011-02-27 LAB — GLUCOSE, CAPILLARY
Glucose-Capillary: 136 mg/dL — ABNORMAL HIGH (ref 70–99)
Glucose-Capillary: 161 mg/dL — ABNORMAL HIGH (ref 70–99)

## 2011-03-01 NOTE — Cardiovascular Report (Signed)
NAME:  Melinda [name], Melinda [name] NO.:  [id number]  MEDICAL RECORD NO.:  [id number]  LOCATION:  [facility]                         FACILITY:  [facility]  PHYSICIAN:  Jerline Pain, MD      DATE OF BIRTH:  30-Dec-1955  DATE OF PROCEDURE:  02/27/2011 DATE OF DISCHARGE:                           CARDIAC CATHETERIZATION   PROCEDURE:  Left heart catheterization via the right radial artery approach, selective coronary angiography and left ventriculogram.  INDICATIONS:  A 55 year old female with known coronary artery disease status post drug-eluting stent to the mid LAD in 2009, with a history of fibromyalgia, presented to the office with symptoms of excessive diaphoresis, palpitations, chest pain, pressure in the neck, worrisome to her.  She underwent a nuclear stress test which did demonstrate a mid anterior wall perfusion defect which was mild.  She was taken to the cath lab for further evaluation.  PROCEDURE DETAILS:  Informed consent was obtained.  Risk of stroke, heart attack, death, renal impairment, arterial damage, bleeding were explained to the patient at length.  1% lidocaine was used for local anesthesia.  She received Versed and fentanyl for conscious sedation.  A 5-French hydrophilic sheath was inserted into the right radial artery without difficulty.  4000 units of heparin was administered after the catheter was around the aortic arch.  3 mg of verapamil was administered after the sheath was inserted.  A Judkins left 3.5 catheter and Judkins right #4 catheter were used to selectively cannulate the coronary arteries and multiple views with hand injection of Omnipaque were obtained.  An angled pigtail was then used to cross the left ventricle and left ventriculogram utilizing 25 mL of contrast was performed.  FINDINGS: 1. Right coronary artery widely patent vessel with no angiographically     significant disease, dominant vessel giving rise to the posterior     descending  artery. 2. Left main artery is widely patent giving rise to both the LAD and     circumflex artery. 3. LAD.  The LAD has one significant diagonal branch in which the     previously placed stent traverses the artery.  There is minor     luminal irregularity at the ostium of this diagonal branch.  The     previously placed stent is widely patent with only minimal in-stent     restenosis.  The LAD then wraps around the apex. 4. Circumflex artery - large vessel giving rise to three significant     obtuse marginal branches which are tortuous, large in caliber.  No     angiographically significant disease is present.  Left     ventriculogram demonstrates normal left ventricular ejection     fraction.  No mitral regurgitation, EF of 60%.  HEMODYNAMICS:  Left ventricular systolic pressure [id number] with an end- diastolic pressure of 80 mmHg.  Aortic pressure 132/79 with a mean of 103 mmHg.  IMPRESSION: 1. Previously placed mid LAD drug-eluting stent in 2009 is widely     patent.  There is only mild luminal irregularity at the diagonal     branch, which is covered by the stent.  Otherwise, widely patent     coronary arteries. 2.  Normal left ventricular ejection fraction of 60% with no wall     motion abnormalities without any significant mitral regurgitation     or aortic stenosis detected.  PLAN:  Reassuring cardiac catheterization.  Continue with aggressive risk factor modification.  Several other possible etiologies for her previously described symptoms.  I will see her back in follow-up for postcatheterization care.  Terumo T band was placed on the right radial artery site without difficulty and she tolerated the procedure well. Allen's test was normal pre and post procedure.     Jerline Pain, MD     MCS/MEDQ  D:  02/27/2011  T:  02/27/2011  Job:  [id number]  Electronically Signed by Candee Furbish MD on 03/01/2011 06:14:32 AM

## 2011-07-03 HISTORY — PX: EYE SURGERY: SHX253

## 2011-07-03 HISTORY — PX: BLEPHAROPLASTY: SUR158

## 2011-07-09 ENCOUNTER — Ambulatory Visit: Payer: Self-pay | Admitting: Pain Medicine

## 2011-07-09 DIAGNOSIS — M545 Low back pain, unspecified: Secondary | ICD-10-CM | POA: Diagnosis not present

## 2011-07-09 DIAGNOSIS — G9332 Myalgic encephalomyelitis/chronic fatigue syndrome: Secondary | ICD-10-CM | POA: Diagnosis not present

## 2011-07-09 DIAGNOSIS — M531 Cervicobrachial syndrome: Secondary | ICD-10-CM | POA: Diagnosis not present

## 2011-07-09 DIAGNOSIS — G96198 Other disorders of meninges, not elsewhere classified: Secondary | ICD-10-CM | POA: Diagnosis not present

## 2011-07-09 DIAGNOSIS — G8929 Other chronic pain: Secondary | ICD-10-CM | POA: Diagnosis not present

## 2011-07-09 DIAGNOSIS — M129 Arthropathy, unspecified: Secondary | ICD-10-CM | POA: Diagnosis not present

## 2011-07-09 DIAGNOSIS — F329 Major depressive disorder, single episode, unspecified: Secondary | ICD-10-CM | POA: Diagnosis not present

## 2011-07-09 DIAGNOSIS — A4902 Methicillin resistant Staphylococcus aureus infection, unspecified site: Secondary | ICD-10-CM | POA: Diagnosis not present

## 2011-07-09 DIAGNOSIS — G609 Hereditary and idiopathic neuropathy, unspecified: Secondary | ICD-10-CM | POA: Diagnosis not present

## 2011-07-09 DIAGNOSIS — M542 Cervicalgia: Secondary | ICD-10-CM | POA: Diagnosis not present

## 2011-07-09 DIAGNOSIS — Z87891 Personal history of nicotine dependence: Secondary | ICD-10-CM | POA: Diagnosis not present

## 2011-07-09 DIAGNOSIS — R5382 Chronic fatigue, unspecified: Secondary | ICD-10-CM | POA: Diagnosis not present

## 2011-07-09 DIAGNOSIS — M5030 Other cervical disc degeneration, unspecified cervical region: Secondary | ICD-10-CM | POA: Diagnosis not present

## 2011-07-09 DIAGNOSIS — F3289 Other specified depressive episodes: Secondary | ICD-10-CM | POA: Diagnosis not present

## 2011-07-09 DIAGNOSIS — M961 Postlaminectomy syndrome, not elsewhere classified: Secondary | ICD-10-CM | POA: Diagnosis not present

## 2011-07-09 DIAGNOSIS — M47812 Spondylosis without myelopathy or radiculopathy, cervical region: Secondary | ICD-10-CM | POA: Diagnosis not present

## 2011-07-09 DIAGNOSIS — IMO0001 Reserved for inherently not codable concepts without codable children: Secondary | ICD-10-CM | POA: Diagnosis not present

## 2011-07-09 DIAGNOSIS — M79609 Pain in unspecified limb: Secondary | ICD-10-CM | POA: Diagnosis not present

## 2011-07-09 DIAGNOSIS — Z79899 Other long term (current) drug therapy: Secondary | ICD-10-CM | POA: Diagnosis not present

## 2011-07-09 DIAGNOSIS — Z7982 Long term (current) use of aspirin: Secondary | ICD-10-CM | POA: Diagnosis not present

## 2011-07-09 DIAGNOSIS — Z9889 Other specified postprocedural states: Secondary | ICD-10-CM | POA: Diagnosis not present

## 2011-07-09 DIAGNOSIS — M5106 Intervertebral disc disorders with myelopathy, lumbar region: Secondary | ICD-10-CM | POA: Diagnosis not present

## 2011-07-09 DIAGNOSIS — M5380 Other specified dorsopathies, site unspecified: Secondary | ICD-10-CM | POA: Diagnosis not present

## 2011-07-30 DIAGNOSIS — H02839 Dermatochalasis of unspecified eye, unspecified eyelid: Secondary | ICD-10-CM | POA: Diagnosis not present

## 2011-08-22 DIAGNOSIS — Z79899 Other long term (current) drug therapy: Secondary | ICD-10-CM | POA: Diagnosis not present

## 2011-08-22 DIAGNOSIS — E7800 Pure hypercholesterolemia, unspecified: Secondary | ICD-10-CM | POA: Diagnosis not present

## 2011-09-03 ENCOUNTER — Ambulatory Visit: Payer: Self-pay | Admitting: Pain Medicine

## 2011-09-03 DIAGNOSIS — M542 Cervicalgia: Secondary | ICD-10-CM | POA: Diagnosis not present

## 2011-09-03 DIAGNOSIS — M1610 Unilateral primary osteoarthritis, unspecified hip: Secondary | ICD-10-CM | POA: Diagnosis not present

## 2011-09-03 DIAGNOSIS — G8929 Other chronic pain: Secondary | ICD-10-CM | POA: Diagnosis not present

## 2011-09-03 DIAGNOSIS — M5380 Other specified dorsopathies, site unspecified: Secondary | ICD-10-CM | POA: Diagnosis not present

## 2011-09-03 DIAGNOSIS — G9332 Myalgic encephalomyelitis/chronic fatigue syndrome: Secondary | ICD-10-CM | POA: Diagnosis not present

## 2011-09-03 DIAGNOSIS — Z9889 Other specified postprocedural states: Secondary | ICD-10-CM | POA: Diagnosis not present

## 2011-09-03 DIAGNOSIS — Z8614 Personal history of Methicillin resistant Staphylococcus aureus infection: Secondary | ICD-10-CM | POA: Diagnosis not present

## 2011-09-03 DIAGNOSIS — M545 Low back pain, unspecified: Secondary | ICD-10-CM | POA: Diagnosis not present

## 2011-09-03 DIAGNOSIS — IMO0001 Reserved for inherently not codable concepts without codable children: Secondary | ICD-10-CM | POA: Diagnosis not present

## 2011-09-03 DIAGNOSIS — Z79899 Other long term (current) drug therapy: Secondary | ICD-10-CM | POA: Diagnosis not present

## 2011-09-03 DIAGNOSIS — M25559 Pain in unspecified hip: Secondary | ICD-10-CM | POA: Diagnosis not present

## 2011-09-03 DIAGNOSIS — G96198 Other disorders of meninges, not elsewhere classified: Secondary | ICD-10-CM | POA: Diagnosis not present

## 2011-09-03 DIAGNOSIS — G609 Hereditary and idiopathic neuropathy, unspecified: Secondary | ICD-10-CM | POA: Diagnosis not present

## 2011-09-03 DIAGNOSIS — M5106 Intervertebral disc disorders with myelopathy, lumbar region: Secondary | ICD-10-CM | POA: Diagnosis not present

## 2011-09-03 DIAGNOSIS — F329 Major depressive disorder, single episode, unspecified: Secondary | ICD-10-CM | POA: Diagnosis not present

## 2011-09-03 DIAGNOSIS — F3289 Other specified depressive episodes: Secondary | ICD-10-CM | POA: Diagnosis not present

## 2011-09-03 DIAGNOSIS — M531 Cervicobrachial syndrome: Secondary | ICD-10-CM | POA: Diagnosis not present

## 2011-09-03 DIAGNOSIS — M961 Postlaminectomy syndrome, not elsewhere classified: Secondary | ICD-10-CM | POA: Diagnosis not present

## 2011-09-03 DIAGNOSIS — M47817 Spondylosis without myelopathy or radiculopathy, lumbosacral region: Secondary | ICD-10-CM | POA: Diagnosis not present

## 2011-09-03 DIAGNOSIS — Z7982 Long term (current) use of aspirin: Secondary | ICD-10-CM | POA: Diagnosis not present

## 2011-09-03 DIAGNOSIS — R5382 Chronic fatigue, unspecified: Secondary | ICD-10-CM | POA: Diagnosis not present

## 2011-09-03 DIAGNOSIS — Z87891 Personal history of nicotine dependence: Secondary | ICD-10-CM | POA: Diagnosis not present

## 2011-09-18 DIAGNOSIS — H02839 Dermatochalasis of unspecified eye, unspecified eyelid: Secondary | ICD-10-CM | POA: Diagnosis not present

## 2011-10-12 DIAGNOSIS — E7800 Pure hypercholesterolemia, unspecified: Secondary | ICD-10-CM | POA: Diagnosis not present

## 2011-10-12 DIAGNOSIS — I471 Supraventricular tachycardia: Secondary | ICD-10-CM | POA: Diagnosis not present

## 2011-10-12 DIAGNOSIS — Z01810 Encounter for preprocedural cardiovascular examination: Secondary | ICD-10-CM | POA: Diagnosis not present

## 2011-10-12 DIAGNOSIS — I2510 Atherosclerotic heart disease of native coronary artery without angina pectoris: Secondary | ICD-10-CM | POA: Diagnosis not present

## 2011-10-12 DIAGNOSIS — Q159 Congenital malformation of eye, unspecified: Secondary | ICD-10-CM | POA: Diagnosis not present

## 2011-11-05 ENCOUNTER — Ambulatory Visit: Payer: Self-pay | Admitting: Pain Medicine

## 2011-11-05 DIAGNOSIS — M5030 Other cervical disc degeneration, unspecified cervical region: Secondary | ICD-10-CM | POA: Diagnosis not present

## 2011-11-05 DIAGNOSIS — F3289 Other specified depressive episodes: Secondary | ICD-10-CM | POA: Diagnosis not present

## 2011-11-05 DIAGNOSIS — Z79899 Other long term (current) drug therapy: Secondary | ICD-10-CM | POA: Diagnosis not present

## 2011-11-05 DIAGNOSIS — M5137 Other intervertebral disc degeneration, lumbosacral region: Secondary | ICD-10-CM | POA: Diagnosis not present

## 2011-11-05 DIAGNOSIS — IMO0001 Reserved for inherently not codable concepts without codable children: Secondary | ICD-10-CM | POA: Diagnosis not present

## 2011-11-05 DIAGNOSIS — M542 Cervicalgia: Secondary | ICD-10-CM | POA: Diagnosis not present

## 2011-11-05 DIAGNOSIS — F329 Major depressive disorder, single episode, unspecified: Secondary | ICD-10-CM | POA: Diagnosis not present

## 2011-11-05 DIAGNOSIS — M545 Low back pain, unspecified: Secondary | ICD-10-CM | POA: Diagnosis not present

## 2011-11-05 DIAGNOSIS — Z9889 Other specified postprocedural states: Secondary | ICD-10-CM | POA: Diagnosis not present

## 2011-11-05 DIAGNOSIS — M25559 Pain in unspecified hip: Secondary | ICD-10-CM | POA: Diagnosis not present

## 2011-11-05 LAB — CBC WITH DIFFERENTIAL/PLATELET
Basophil #: 0 10*3/uL (ref 0.0–0.1)
Basophil %: 0.5 %
Eosinophil #: 0.2 10*3/uL (ref 0.0–0.7)
Eosinophil %: 3.7 %
HCT: 39.1 % (ref 35.0–47.0)
HGB: 13.3 g/dL (ref 12.0–16.0)
Lymphocyte #: 2.5 10*3/uL (ref 1.0–3.6)
Lymphocyte %: 36.9 %
MCH: 29.2 pg (ref 26.0–34.0)
MCHC: 34 g/dL (ref 32.0–36.0)
MCV: 86 fL (ref 80–100)
Monocyte #: 0.5 x10 3/mm (ref 0.2–0.9)
Monocyte %: 7.2 %
Neutrophil #: 3.5 10*3/uL (ref 1.4–6.5)
Neutrophil %: 51.7 %
Platelet: 229 10*3/uL (ref 150–440)
RBC: 4.54 10*6/uL (ref 3.80–5.20)
RDW: 12.6 % (ref 11.5–14.5)
WBC: 6.7 10*3/uL (ref 3.6–11.0)

## 2011-11-05 LAB — BASIC METABOLIC PANEL
Anion Gap: 6 — ABNORMAL LOW (ref 7–16)
BUN: 23 mg/dL — ABNORMAL HIGH (ref 7–18)
Calcium, Total: 9.4 mg/dL (ref 8.5–10.1)
Chloride: 104 mmol/L (ref 98–107)
Co2: 31 mmol/L (ref 21–32)
Creatinine: 0.84 mg/dL (ref 0.60–1.30)
EGFR (African American): >60
EGFR (Non-African Amer.): >60
Glucose: 117 mg/dL — ABNORMAL HIGH (ref 65–99)
Osmolality: 286 (ref 275–301)
Potassium: 4.5 mmol/L (ref 3.5–5.1)
Sodium: 141 mmol/L (ref 136–145)

## 2011-11-09 ENCOUNTER — Ambulatory Visit: Payer: Self-pay | Admitting: Pain Medicine

## 2011-11-09 DIAGNOSIS — M542 Cervicalgia: Secondary | ICD-10-CM | POA: Diagnosis not present

## 2011-11-09 DIAGNOSIS — M5126 Other intervertebral disc displacement, lumbar region: Secondary | ICD-10-CM | POA: Diagnosis not present

## 2011-11-09 DIAGNOSIS — Z4689 Encounter for fitting and adjustment of other specified devices: Secondary | ICD-10-CM | POA: Diagnosis not present

## 2011-11-09 DIAGNOSIS — T85695A Other mechanical complication of other nervous system device, implant or graft, initial encounter: Secondary | ICD-10-CM | POA: Diagnosis not present

## 2011-11-09 DIAGNOSIS — G894 Chronic pain syndrome: Secondary | ICD-10-CM | POA: Diagnosis not present

## 2011-11-09 DIAGNOSIS — Z79899 Other long term (current) drug therapy: Secondary | ICD-10-CM | POA: Diagnosis not present

## 2011-11-09 DIAGNOSIS — F3289 Other specified depressive episodes: Secondary | ICD-10-CM | POA: Diagnosis not present

## 2011-11-09 DIAGNOSIS — M545 Low back pain, unspecified: Secondary | ICD-10-CM | POA: Diagnosis not present

## 2011-11-09 DIAGNOSIS — M5030 Other cervical disc degeneration, unspecified cervical region: Secondary | ICD-10-CM | POA: Diagnosis not present

## 2011-11-09 DIAGNOSIS — G4730 Sleep apnea, unspecified: Secondary | ICD-10-CM | POA: Diagnosis not present

## 2011-11-09 DIAGNOSIS — M1610 Unilateral primary osteoarthritis, unspecified hip: Secondary | ICD-10-CM | POA: Diagnosis not present

## 2011-11-09 DIAGNOSIS — IMO0002 Reserved for concepts with insufficient information to code with codable children: Secondary | ICD-10-CM | POA: Diagnosis not present

## 2011-11-09 DIAGNOSIS — M961 Postlaminectomy syndrome, not elsewhere classified: Secondary | ICD-10-CM | POA: Diagnosis not present

## 2011-11-09 DIAGNOSIS — IMO0001 Reserved for inherently not codable concepts without codable children: Secondary | ICD-10-CM | POA: Diagnosis not present

## 2011-11-09 DIAGNOSIS — R609 Edema, unspecified: Secondary | ICD-10-CM | POA: Diagnosis not present

## 2011-11-09 DIAGNOSIS — I2510 Atherosclerotic heart disease of native coronary artery without angina pectoris: Secondary | ICD-10-CM | POA: Diagnosis not present

## 2011-11-09 DIAGNOSIS — Z7982 Long term (current) use of aspirin: Secondary | ICD-10-CM | POA: Diagnosis not present

## 2011-11-09 DIAGNOSIS — G9332 Myalgic encephalomyelitis/chronic fatigue syndrome: Secondary | ICD-10-CM | POA: Diagnosis not present

## 2011-11-09 DIAGNOSIS — G609 Hereditary and idiopathic neuropathy, unspecified: Secondary | ICD-10-CM | POA: Diagnosis not present

## 2011-11-09 DIAGNOSIS — Z9861 Coronary angioplasty status: Secondary | ICD-10-CM | POA: Diagnosis not present

## 2011-11-09 DIAGNOSIS — I498 Other specified cardiac arrhythmias: Secondary | ICD-10-CM | POA: Diagnosis not present

## 2011-11-11 ENCOUNTER — Emergency Department: Payer: Self-pay | Admitting: Emergency Medicine

## 2011-11-12 ENCOUNTER — Other Ambulatory Visit: Payer: Self-pay | Admitting: Pain Medicine

## 2011-11-12 ENCOUNTER — Ambulatory Visit: Payer: Self-pay | Admitting: Pain Medicine

## 2011-11-12 DIAGNOSIS — M545 Low back pain, unspecified: Secondary | ICD-10-CM | POA: Diagnosis not present

## 2011-11-12 DIAGNOSIS — R52 Pain, unspecified: Secondary | ICD-10-CM | POA: Diagnosis not present

## 2011-11-12 DIAGNOSIS — M5137 Other intervertebral disc degeneration, lumbosacral region: Secondary | ICD-10-CM | POA: Diagnosis not present

## 2011-11-12 LAB — CBC WITH DIFFERENTIAL/PLATELET
Basophil #: 0.1 10*3/uL (ref 0.0–0.1)
Basophil %: 0.6 %
Eosinophil #: 0.6 10*3/uL (ref 0.0–0.7)
Eosinophil %: 5.8 %
HCT: 40.7 % (ref 35.0–47.0)
HGB: 14 g/dL (ref 12.0–16.0)
Lymphocyte #: 2.5 10*3/uL (ref 1.0–3.6)
Lymphocyte %: 23.3 %
MCH: 29.5 pg (ref 26.0–34.0)
MCHC: 34.5 g/dL (ref 32.0–36.0)
MCV: 86 fL (ref 80–100)
Monocyte #: 0.8 x10 3/mm (ref 0.2–0.9)
Monocyte %: 7.9 %
Neutrophil #: 6.6 10*3/uL — ABNORMAL HIGH (ref 1.4–6.5)
Neutrophil %: 62.4 %
Platelet: 274 10*3/uL (ref 150–440)
RBC: 4.75 10*6/uL (ref 3.80–5.20)
RDW: 12.8 % (ref 11.5–14.5)
WBC: 10.6 10*3/uL (ref 3.6–11.0)

## 2011-11-12 LAB — ELECTROLYTE PANEL
Anion Gap: 7 (ref 7–16)
Chloride: 106 mmol/L (ref 98–107)
Co2: 29 mmol/L (ref 21–32)
Potassium: 4.2 mmol/L (ref 3.5–5.1)
Sodium: 142 mmol/L (ref 136–145)

## 2011-11-12 LAB — PATHOLOGY REPORT

## 2011-11-20 ENCOUNTER — Ambulatory Visit: Payer: Self-pay | Admitting: Pain Medicine

## 2011-11-20 DIAGNOSIS — Z8614 Personal history of Methicillin resistant Staphylococcus aureus infection: Secondary | ICD-10-CM | POA: Diagnosis not present

## 2011-11-20 DIAGNOSIS — Z7982 Long term (current) use of aspirin: Secondary | ICD-10-CM | POA: Diagnosis not present

## 2011-11-20 DIAGNOSIS — M542 Cervicalgia: Secondary | ICD-10-CM | POA: Diagnosis not present

## 2011-11-20 DIAGNOSIS — R5382 Chronic fatigue, unspecified: Secondary | ICD-10-CM | POA: Diagnosis not present

## 2011-11-20 DIAGNOSIS — IMO0001 Reserved for inherently not codable concepts without codable children: Secondary | ICD-10-CM | POA: Diagnosis not present

## 2011-11-20 DIAGNOSIS — G9332 Myalgic encephalomyelitis/chronic fatigue syndrome: Secondary | ICD-10-CM | POA: Diagnosis not present

## 2011-11-20 DIAGNOSIS — G609 Hereditary and idiopathic neuropathy, unspecified: Secondary | ICD-10-CM | POA: Diagnosis not present

## 2011-11-20 DIAGNOSIS — Z9889 Other specified postprocedural states: Secondary | ICD-10-CM | POA: Diagnosis not present

## 2011-11-20 DIAGNOSIS — M47817 Spondylosis without myelopathy or radiculopathy, lumbosacral region: Secondary | ICD-10-CM | POA: Diagnosis not present

## 2011-11-20 DIAGNOSIS — M25559 Pain in unspecified hip: Secondary | ICD-10-CM | POA: Diagnosis not present

## 2011-11-20 DIAGNOSIS — M5137 Other intervertebral disc degeneration, lumbosacral region: Secondary | ICD-10-CM | POA: Diagnosis not present

## 2011-11-20 DIAGNOSIS — M961 Postlaminectomy syndrome, not elsewhere classified: Secondary | ICD-10-CM | POA: Diagnosis not present

## 2011-11-20 DIAGNOSIS — E669 Obesity, unspecified: Secondary | ICD-10-CM | POA: Diagnosis not present

## 2011-11-20 DIAGNOSIS — Z79899 Other long term (current) drug therapy: Secondary | ICD-10-CM | POA: Diagnosis not present

## 2011-11-20 DIAGNOSIS — M1610 Unilateral primary osteoarthritis, unspecified hip: Secondary | ICD-10-CM | POA: Diagnosis not present

## 2011-11-20 DIAGNOSIS — M545 Low back pain, unspecified: Secondary | ICD-10-CM | POA: Diagnosis not present

## 2011-11-20 DIAGNOSIS — G8929 Other chronic pain: Secondary | ICD-10-CM | POA: Diagnosis not present

## 2011-11-20 DIAGNOSIS — M5030 Other cervical disc degeneration, unspecified cervical region: Secondary | ICD-10-CM | POA: Diagnosis not present

## 2011-11-20 DIAGNOSIS — M169 Osteoarthritis of hip, unspecified: Secondary | ICD-10-CM | POA: Diagnosis not present

## 2011-12-04 DIAGNOSIS — F339 Major depressive disorder, recurrent, unspecified: Secondary | ICD-10-CM | POA: Diagnosis not present

## 2011-12-06 ENCOUNTER — Ambulatory Visit: Payer: Self-pay

## 2011-12-06 DIAGNOSIS — I498 Other specified cardiac arrhythmias: Secondary | ICD-10-CM | POA: Diagnosis not present

## 2011-12-06 DIAGNOSIS — M48061 Spinal stenosis, lumbar region without neurogenic claudication: Secondary | ICD-10-CM | POA: Diagnosis not present

## 2011-12-06 DIAGNOSIS — H02409 Unspecified ptosis of unspecified eyelid: Secondary | ICD-10-CM | POA: Diagnosis not present

## 2011-12-06 DIAGNOSIS — Z79899 Other long term (current) drug therapy: Secondary | ICD-10-CM | POA: Diagnosis not present

## 2011-12-06 DIAGNOSIS — I459 Conduction disorder, unspecified: Secondary | ICD-10-CM | POA: Diagnosis not present

## 2011-12-06 DIAGNOSIS — Z9861 Coronary angioplasty status: Secondary | ICD-10-CM | POA: Diagnosis not present

## 2011-12-06 DIAGNOSIS — H02839 Dermatochalasis of unspecified eye, unspecified eyelid: Secondary | ICD-10-CM | POA: Diagnosis not present

## 2011-12-06 DIAGNOSIS — Z87891 Personal history of nicotine dependence: Secondary | ICD-10-CM | POA: Diagnosis not present

## 2011-12-06 DIAGNOSIS — IMO0001 Reserved for inherently not codable concepts without codable children: Secondary | ICD-10-CM | POA: Diagnosis not present

## 2011-12-06 DIAGNOSIS — G4733 Obstructive sleep apnea (adult) (pediatric): Secondary | ICD-10-CM | POA: Diagnosis not present

## 2011-12-06 DIAGNOSIS — I471 Supraventricular tachycardia: Secondary | ICD-10-CM | POA: Diagnosis not present

## 2011-12-06 DIAGNOSIS — Z7982 Long term (current) use of aspirin: Secondary | ICD-10-CM | POA: Diagnosis not present

## 2011-12-06 DIAGNOSIS — IMO0002 Reserved for concepts with insufficient information to code with codable children: Secondary | ICD-10-CM | POA: Diagnosis not present

## 2011-12-06 DIAGNOSIS — R5381 Other malaise: Secondary | ICD-10-CM | POA: Diagnosis not present

## 2011-12-06 DIAGNOSIS — I2510 Atherosclerotic heart disease of native coronary artery without angina pectoris: Secondary | ICD-10-CM | POA: Diagnosis not present

## 2011-12-06 DIAGNOSIS — E669 Obesity, unspecified: Secondary | ICD-10-CM | POA: Diagnosis not present

## 2011-12-06 DIAGNOSIS — Z6833 Body mass index (BMI) 33.0-33.9, adult: Secondary | ICD-10-CM | POA: Diagnosis not present

## 2011-12-06 DIAGNOSIS — R209 Unspecified disturbances of skin sensation: Secondary | ICD-10-CM | POA: Diagnosis not present

## 2012-03-07 DIAGNOSIS — E7800 Pure hypercholesterolemia, unspecified: Secondary | ICD-10-CM | POA: Diagnosis not present

## 2012-03-11 ENCOUNTER — Ambulatory Visit: Payer: Self-pay | Admitting: Pain Medicine

## 2012-03-11 DIAGNOSIS — M542 Cervicalgia: Secondary | ICD-10-CM | POA: Diagnosis not present

## 2012-03-11 DIAGNOSIS — M25559 Pain in unspecified hip: Secondary | ICD-10-CM | POA: Diagnosis not present

## 2012-03-11 DIAGNOSIS — IMO0002 Reserved for concepts with insufficient information to code with codable children: Secondary | ICD-10-CM | POA: Diagnosis not present

## 2012-03-11 DIAGNOSIS — G8929 Other chronic pain: Secondary | ICD-10-CM | POA: Diagnosis not present

## 2012-03-11 DIAGNOSIS — M47817 Spondylosis without myelopathy or radiculopathy, lumbosacral region: Secondary | ICD-10-CM | POA: Diagnosis not present

## 2012-03-11 DIAGNOSIS — M169 Osteoarthritis of hip, unspecified: Secondary | ICD-10-CM | POA: Diagnosis not present

## 2012-03-11 DIAGNOSIS — M79609 Pain in unspecified limb: Secondary | ICD-10-CM | POA: Diagnosis not present

## 2012-03-11 DIAGNOSIS — M47812 Spondylosis without myelopathy or radiculopathy, cervical region: Secondary | ICD-10-CM | POA: Diagnosis not present

## 2012-03-11 DIAGNOSIS — IMO0001 Reserved for inherently not codable concepts without codable children: Secondary | ICD-10-CM | POA: Diagnosis not present

## 2012-03-11 DIAGNOSIS — M545 Low back pain, unspecified: Secondary | ICD-10-CM | POA: Diagnosis not present

## 2012-03-11 DIAGNOSIS — M1610 Unilateral primary osteoarthritis, unspecified hip: Secondary | ICD-10-CM | POA: Diagnosis not present

## 2012-03-12 DIAGNOSIS — E7800 Pure hypercholesterolemia, unspecified: Secondary | ICD-10-CM | POA: Diagnosis not present

## 2012-03-12 DIAGNOSIS — I471 Supraventricular tachycardia: Secondary | ICD-10-CM | POA: Diagnosis not present

## 2012-03-12 DIAGNOSIS — I2510 Atherosclerotic heart disease of native coronary artery without angina pectoris: Secondary | ICD-10-CM | POA: Diagnosis not present

## 2012-03-12 DIAGNOSIS — R079 Chest pain, unspecified: Secondary | ICD-10-CM | POA: Diagnosis not present

## 2012-05-05 DIAGNOSIS — R630 Anorexia: Secondary | ICD-10-CM | POA: Diagnosis not present

## 2012-05-05 DIAGNOSIS — F339 Major depressive disorder, recurrent, unspecified: Secondary | ICD-10-CM | POA: Diagnosis not present

## 2012-05-05 DIAGNOSIS — R109 Unspecified abdominal pain: Secondary | ICD-10-CM | POA: Diagnosis not present

## 2012-05-05 DIAGNOSIS — R110 Nausea: Secondary | ICD-10-CM | POA: Diagnosis not present

## 2012-05-09 DIAGNOSIS — G4733 Obstructive sleep apnea (adult) (pediatric): Secondary | ICD-10-CM | POA: Diagnosis not present

## 2012-07-10 ENCOUNTER — Ambulatory Visit: Payer: Self-pay | Admitting: Pain Medicine

## 2012-07-10 DIAGNOSIS — M5137 Other intervertebral disc degeneration, lumbosacral region: Secondary | ICD-10-CM | POA: Diagnosis not present

## 2012-07-10 DIAGNOSIS — M545 Low back pain, unspecified: Secondary | ICD-10-CM | POA: Diagnosis not present

## 2012-07-10 DIAGNOSIS — Z79899 Other long term (current) drug therapy: Secondary | ICD-10-CM | POA: Diagnosis not present

## 2012-07-10 DIAGNOSIS — M25559 Pain in unspecified hip: Secondary | ICD-10-CM | POA: Diagnosis not present

## 2012-07-10 DIAGNOSIS — M79609 Pain in unspecified limb: Secondary | ICD-10-CM | POA: Diagnosis not present

## 2012-07-10 DIAGNOSIS — IMO0001 Reserved for inherently not codable concepts without codable children: Secondary | ICD-10-CM | POA: Diagnosis not present

## 2012-07-10 DIAGNOSIS — M47817 Spondylosis without myelopathy or radiculopathy, lumbosacral region: Secondary | ICD-10-CM | POA: Diagnosis not present

## 2012-07-10 DIAGNOSIS — G894 Chronic pain syndrome: Secondary | ICD-10-CM | POA: Diagnosis not present

## 2012-07-19 DIAGNOSIS — G608 Other hereditary and idiopathic neuropathies: Secondary | ICD-10-CM | POA: Diagnosis not present

## 2012-07-19 DIAGNOSIS — IMO0001 Reserved for inherently not codable concepts without codable children: Secondary | ICD-10-CM | POA: Diagnosis not present

## 2012-07-19 DIAGNOSIS — L97509 Non-pressure chronic ulcer of other part of unspecified foot with unspecified severity: Secondary | ICD-10-CM | POA: Diagnosis not present

## 2012-07-19 DIAGNOSIS — M79609 Pain in unspecified limb: Secondary | ICD-10-CM | POA: Diagnosis not present

## 2012-07-19 DIAGNOSIS — L02619 Cutaneous abscess of unspecified foot: Secondary | ICD-10-CM | POA: Diagnosis not present

## 2012-07-19 DIAGNOSIS — L03119 Cellulitis of unspecified part of limb: Secondary | ICD-10-CM | POA: Diagnosis not present

## 2012-07-28 ENCOUNTER — Encounter: Payer: Self-pay | Admitting: Nurse Practitioner

## 2012-07-28 ENCOUNTER — Encounter: Payer: Self-pay | Admitting: Cardiothoracic Surgery

## 2012-07-28 DIAGNOSIS — L97809 Non-pressure chronic ulcer of other part of unspecified lower leg with unspecified severity: Secondary | ICD-10-CM | POA: Diagnosis not present

## 2012-07-28 DIAGNOSIS — G8929 Other chronic pain: Secondary | ICD-10-CM | POA: Diagnosis not present

## 2012-07-28 DIAGNOSIS — L97509 Non-pressure chronic ulcer of other part of unspecified foot with unspecified severity: Secondary | ICD-10-CM | POA: Diagnosis not present

## 2012-07-28 DIAGNOSIS — I498 Other specified cardiac arrhythmias: Secondary | ICD-10-CM | POA: Diagnosis not present

## 2012-07-28 DIAGNOSIS — I2510 Atherosclerotic heart disease of native coronary artery without angina pectoris: Secondary | ICD-10-CM | POA: Diagnosis not present

## 2012-08-02 ENCOUNTER — Encounter: Payer: Self-pay | Admitting: Nurse Practitioner

## 2012-08-04 ENCOUNTER — Encounter: Payer: Self-pay | Admitting: Cardiothoracic Surgery

## 2012-08-04 DIAGNOSIS — I498 Other specified cardiac arrhythmias: Secondary | ICD-10-CM | POA: Diagnosis not present

## 2012-08-04 DIAGNOSIS — L97809 Non-pressure chronic ulcer of other part of unspecified lower leg with unspecified severity: Secondary | ICD-10-CM | POA: Diagnosis not present

## 2012-08-04 DIAGNOSIS — G8929 Other chronic pain: Secondary | ICD-10-CM | POA: Diagnosis not present

## 2012-08-04 DIAGNOSIS — I2510 Atherosclerotic heart disease of native coronary artery without angina pectoris: Secondary | ICD-10-CM | POA: Diagnosis not present

## 2012-08-04 DIAGNOSIS — L97509 Non-pressure chronic ulcer of other part of unspecified foot with unspecified severity: Secondary | ICD-10-CM | POA: Diagnosis not present

## 2012-08-05 DIAGNOSIS — L97509 Non-pressure chronic ulcer of other part of unspecified foot with unspecified severity: Secondary | ICD-10-CM | POA: Diagnosis not present

## 2012-08-05 DIAGNOSIS — M79609 Pain in unspecified limb: Secondary | ICD-10-CM | POA: Diagnosis not present

## 2012-08-07 DIAGNOSIS — S98119A Complete traumatic amputation of unspecified great toe, initial encounter: Secondary | ICD-10-CM | POA: Diagnosis not present

## 2012-08-07 DIAGNOSIS — M7750 Other enthesopathy of unspecified foot: Secondary | ICD-10-CM | POA: Diagnosis not present

## 2012-08-07 DIAGNOSIS — M76829 Posterior tibial tendinitis, unspecified leg: Secondary | ICD-10-CM | POA: Diagnosis not present

## 2012-08-07 DIAGNOSIS — M79609 Pain in unspecified limb: Secondary | ICD-10-CM | POA: Diagnosis not present

## 2012-08-07 DIAGNOSIS — S93306A Unspecified dislocation of unspecified foot, initial encounter: Secondary | ICD-10-CM | POA: Diagnosis not present

## 2012-08-08 DIAGNOSIS — Z9861 Coronary angioplasty status: Secondary | ICD-10-CM | POA: Diagnosis not present

## 2012-08-08 DIAGNOSIS — R079 Chest pain, unspecified: Secondary | ICD-10-CM | POA: Diagnosis not present

## 2012-08-08 DIAGNOSIS — I2510 Atherosclerotic heart disease of native coronary artery without angina pectoris: Secondary | ICD-10-CM | POA: Diagnosis not present

## 2012-08-08 DIAGNOSIS — E7800 Pure hypercholesterolemia, unspecified: Secondary | ICD-10-CM | POA: Diagnosis not present

## 2012-08-15 DIAGNOSIS — I498 Other specified cardiac arrhythmias: Secondary | ICD-10-CM | POA: Diagnosis not present

## 2012-08-15 DIAGNOSIS — G8929 Other chronic pain: Secondary | ICD-10-CM | POA: Diagnosis not present

## 2012-08-15 DIAGNOSIS — L97509 Non-pressure chronic ulcer of other part of unspecified foot with unspecified severity: Secondary | ICD-10-CM | POA: Diagnosis not present

## 2012-08-15 DIAGNOSIS — I2510 Atherosclerotic heart disease of native coronary artery without angina pectoris: Secondary | ICD-10-CM | POA: Diagnosis not present

## 2012-08-15 DIAGNOSIS — L97809 Non-pressure chronic ulcer of other part of unspecified lower leg with unspecified severity: Secondary | ICD-10-CM | POA: Diagnosis not present

## 2012-08-18 DIAGNOSIS — L97809 Non-pressure chronic ulcer of other part of unspecified lower leg with unspecified severity: Secondary | ICD-10-CM | POA: Diagnosis not present

## 2012-08-18 DIAGNOSIS — L97509 Non-pressure chronic ulcer of other part of unspecified foot with unspecified severity: Secondary | ICD-10-CM | POA: Diagnosis not present

## 2012-08-18 DIAGNOSIS — I498 Other specified cardiac arrhythmias: Secondary | ICD-10-CM | POA: Diagnosis not present

## 2012-08-18 DIAGNOSIS — I2510 Atherosclerotic heart disease of native coronary artery without angina pectoris: Secondary | ICD-10-CM | POA: Diagnosis not present

## 2012-08-18 DIAGNOSIS — G8929 Other chronic pain: Secondary | ICD-10-CM | POA: Diagnosis not present

## 2012-08-20 DIAGNOSIS — Z9861 Coronary angioplasty status: Secondary | ICD-10-CM | POA: Diagnosis not present

## 2012-08-20 DIAGNOSIS — E7800 Pure hypercholesterolemia, unspecified: Secondary | ICD-10-CM | POA: Diagnosis not present

## 2012-08-20 DIAGNOSIS — I2510 Atherosclerotic heart disease of native coronary artery without angina pectoris: Secondary | ICD-10-CM | POA: Diagnosis not present

## 2012-08-20 DIAGNOSIS — R079 Chest pain, unspecified: Secondary | ICD-10-CM | POA: Diagnosis not present

## 2012-08-21 DIAGNOSIS — IMO0002 Reserved for concepts with insufficient information to code with codable children: Secondary | ICD-10-CM | POA: Diagnosis not present

## 2012-08-21 DIAGNOSIS — M999 Biomechanical lesion, unspecified: Secondary | ICD-10-CM | POA: Diagnosis not present

## 2012-08-21 DIAGNOSIS — M5020 Other cervical disc displacement, unspecified cervical region: Secondary | ICD-10-CM | POA: Diagnosis not present

## 2012-08-21 DIAGNOSIS — M9981 Other biomechanical lesions of cervical region: Secondary | ICD-10-CM | POA: Diagnosis not present

## 2012-08-28 ENCOUNTER — Ambulatory Visit: Payer: Self-pay | Admitting: Pain Medicine

## 2012-08-28 DIAGNOSIS — R079 Chest pain, unspecified: Secondary | ICD-10-CM | POA: Diagnosis not present

## 2012-08-28 DIAGNOSIS — R52 Pain, unspecified: Secondary | ICD-10-CM | POA: Diagnosis not present

## 2012-08-30 ENCOUNTER — Encounter: Payer: Self-pay | Admitting: Nurse Practitioner

## 2012-08-30 ENCOUNTER — Encounter: Payer: Self-pay | Admitting: Cardiothoracic Surgery

## 2012-08-30 DIAGNOSIS — G8929 Other chronic pain: Secondary | ICD-10-CM | POA: Diagnosis not present

## 2012-08-30 DIAGNOSIS — I2510 Atherosclerotic heart disease of native coronary artery without angina pectoris: Secondary | ICD-10-CM | POA: Diagnosis not present

## 2012-08-30 DIAGNOSIS — M216X9 Other acquired deformities of unspecified foot: Secondary | ICD-10-CM | POA: Diagnosis not present

## 2012-08-30 DIAGNOSIS — L89899 Pressure ulcer of other site, unspecified stage: Secondary | ICD-10-CM | POA: Diagnosis not present

## 2012-08-30 DIAGNOSIS — L8992 Pressure ulcer of unspecified site, stage 2: Secondary | ICD-10-CM | POA: Diagnosis not present

## 2012-08-30 DIAGNOSIS — I498 Other specified cardiac arrhythmias: Secondary | ICD-10-CM | POA: Diagnosis not present

## 2012-09-08 DIAGNOSIS — L8992 Pressure ulcer of unspecified site, stage 2: Secondary | ICD-10-CM | POA: Diagnosis not present

## 2012-09-08 DIAGNOSIS — G8929 Other chronic pain: Secondary | ICD-10-CM | POA: Diagnosis not present

## 2012-09-08 DIAGNOSIS — L89899 Pressure ulcer of other site, unspecified stage: Secondary | ICD-10-CM | POA: Diagnosis not present

## 2012-09-08 DIAGNOSIS — I498 Other specified cardiac arrhythmias: Secondary | ICD-10-CM | POA: Diagnosis not present

## 2012-09-08 DIAGNOSIS — I2510 Atherosclerotic heart disease of native coronary artery without angina pectoris: Secondary | ICD-10-CM | POA: Diagnosis not present

## 2012-09-08 DIAGNOSIS — M216X9 Other acquired deformities of unspecified foot: Secondary | ICD-10-CM | POA: Diagnosis not present

## 2012-09-10 DIAGNOSIS — I2510 Atherosclerotic heart disease of native coronary artery without angina pectoris: Secondary | ICD-10-CM | POA: Diagnosis not present

## 2012-09-10 DIAGNOSIS — I209 Angina pectoris, unspecified: Secondary | ICD-10-CM | POA: Diagnosis not present

## 2012-09-10 DIAGNOSIS — E7800 Pure hypercholesterolemia, unspecified: Secondary | ICD-10-CM | POA: Diagnosis not present

## 2012-09-10 DIAGNOSIS — Z9861 Coronary angioplasty status: Secondary | ICD-10-CM | POA: Diagnosis not present

## 2012-09-10 DIAGNOSIS — I471 Supraventricular tachycardia: Secondary | ICD-10-CM | POA: Diagnosis not present

## 2012-09-23 DIAGNOSIS — M999 Biomechanical lesion, unspecified: Secondary | ICD-10-CM | POA: Diagnosis not present

## 2012-09-23 DIAGNOSIS — S239XXA Sprain of unspecified parts of thorax, initial encounter: Secondary | ICD-10-CM | POA: Diagnosis not present

## 2012-09-23 DIAGNOSIS — M546 Pain in thoracic spine: Secondary | ICD-10-CM | POA: Diagnosis not present

## 2012-09-25 DIAGNOSIS — M999 Biomechanical lesion, unspecified: Secondary | ICD-10-CM | POA: Diagnosis not present

## 2012-09-25 DIAGNOSIS — S239XXA Sprain of unspecified parts of thorax, initial encounter: Secondary | ICD-10-CM | POA: Diagnosis not present

## 2012-09-25 DIAGNOSIS — M546 Pain in thoracic spine: Secondary | ICD-10-CM | POA: Diagnosis not present

## 2012-09-29 DIAGNOSIS — M546 Pain in thoracic spine: Secondary | ICD-10-CM | POA: Diagnosis not present

## 2012-09-29 DIAGNOSIS — S239XXA Sprain of unspecified parts of thorax, initial encounter: Secondary | ICD-10-CM | POA: Diagnosis not present

## 2012-09-29 DIAGNOSIS — M999 Biomechanical lesion, unspecified: Secondary | ICD-10-CM | POA: Diagnosis not present

## 2012-10-02 DIAGNOSIS — S239XXA Sprain of unspecified parts of thorax, initial encounter: Secondary | ICD-10-CM | POA: Diagnosis not present

## 2012-10-02 DIAGNOSIS — M546 Pain in thoracic spine: Secondary | ICD-10-CM | POA: Diagnosis not present

## 2012-10-02 DIAGNOSIS — M999 Biomechanical lesion, unspecified: Secondary | ICD-10-CM | POA: Diagnosis not present

## 2012-10-06 DIAGNOSIS — M546 Pain in thoracic spine: Secondary | ICD-10-CM | POA: Diagnosis not present

## 2012-10-06 DIAGNOSIS — S239XXA Sprain of unspecified parts of thorax, initial encounter: Secondary | ICD-10-CM | POA: Diagnosis not present

## 2012-10-06 DIAGNOSIS — M999 Biomechanical lesion, unspecified: Secondary | ICD-10-CM | POA: Diagnosis not present

## 2012-10-09 DIAGNOSIS — Z9861 Coronary angioplasty status: Secondary | ICD-10-CM | POA: Diagnosis not present

## 2012-10-09 DIAGNOSIS — I471 Supraventricular tachycardia: Secondary | ICD-10-CM | POA: Diagnosis not present

## 2012-10-09 DIAGNOSIS — E7800 Pure hypercholesterolemia, unspecified: Secondary | ICD-10-CM | POA: Diagnosis not present

## 2012-10-09 DIAGNOSIS — I2510 Atherosclerotic heart disease of native coronary artery without angina pectoris: Secondary | ICD-10-CM | POA: Diagnosis not present

## 2012-11-04 ENCOUNTER — Ambulatory Visit: Payer: Self-pay | Admitting: Pain Medicine

## 2012-11-04 ENCOUNTER — Other Ambulatory Visit: Payer: Self-pay | Admitting: Pain Medicine

## 2012-11-04 DIAGNOSIS — IMO0001 Reserved for inherently not codable concepts without codable children: Secondary | ICD-10-CM | POA: Diagnosis not present

## 2012-11-04 DIAGNOSIS — G894 Chronic pain syndrome: Secondary | ICD-10-CM | POA: Diagnosis not present

## 2012-11-04 DIAGNOSIS — M545 Low back pain, unspecified: Secondary | ICD-10-CM | POA: Diagnosis not present

## 2012-11-04 DIAGNOSIS — M47817 Spondylosis without myelopathy or radiculopathy, lumbosacral region: Secondary | ICD-10-CM | POA: Diagnosis not present

## 2012-11-04 DIAGNOSIS — M79609 Pain in unspecified limb: Secondary | ICD-10-CM | POA: Diagnosis not present

## 2012-11-04 LAB — BASIC METABOLIC PANEL
Anion Gap: 4 — ABNORMAL LOW (ref 7–16)
BUN: 21 mg/dL — ABNORMAL HIGH (ref 7–18)
Calcium, Total: 9.2 mg/dL (ref 8.5–10.1)
Chloride: 103 mmol/L (ref 98–107)
Co2: 30 mmol/L (ref 21–32)
Creatinine: 0.93 mg/dL (ref 0.60–1.30)
EGFR (African American): >60
EGFR (Non-African Amer.): >60
Glucose: 155 mg/dL — ABNORMAL HIGH (ref 65–99)
Osmolality: 280 (ref 275–301)
Potassium: 4.3 mmol/L (ref 3.5–5.1)
Sodium: 137 mmol/L (ref 136–145)

## 2012-11-04 LAB — MAGNESIUM: Magnesium: 1.9 mg/dL

## 2012-11-14 DIAGNOSIS — R071 Chest pain on breathing: Secondary | ICD-10-CM | POA: Diagnosis not present

## 2012-11-25 DIAGNOSIS — F339 Major depressive disorder, recurrent, unspecified: Secondary | ICD-10-CM | POA: Diagnosis not present

## 2013-02-18 DIAGNOSIS — I471 Supraventricular tachycardia: Secondary | ICD-10-CM | POA: Diagnosis not present

## 2013-02-18 DIAGNOSIS — Z9861 Coronary angioplasty status: Secondary | ICD-10-CM | POA: Diagnosis not present

## 2013-02-18 DIAGNOSIS — R002 Palpitations: Secondary | ICD-10-CM | POA: Diagnosis not present

## 2013-02-18 DIAGNOSIS — E7800 Pure hypercholesterolemia, unspecified: Secondary | ICD-10-CM | POA: Diagnosis not present

## 2013-02-18 DIAGNOSIS — I2510 Atherosclerotic heart disease of native coronary artery without angina pectoris: Secondary | ICD-10-CM | POA: Diagnosis not present

## 2013-03-05 ENCOUNTER — Ambulatory Visit: Payer: Self-pay | Admitting: Pain Medicine

## 2013-03-05 DIAGNOSIS — G620 Drug-induced polyneuropathy: Secondary | ICD-10-CM | POA: Diagnosis not present

## 2013-03-05 DIAGNOSIS — M79609 Pain in unspecified limb: Secondary | ICD-10-CM | POA: Diagnosis not present

## 2013-03-05 DIAGNOSIS — G8929 Other chronic pain: Secondary | ICD-10-CM | POA: Diagnosis not present

## 2013-03-05 DIAGNOSIS — M545 Low back pain, unspecified: Secondary | ICD-10-CM | POA: Diagnosis not present

## 2013-03-05 DIAGNOSIS — F329 Major depressive disorder, single episode, unspecified: Secondary | ICD-10-CM | POA: Diagnosis not present

## 2013-03-05 DIAGNOSIS — M47812 Spondylosis without myelopathy or radiculopathy, cervical region: Secondary | ICD-10-CM | POA: Diagnosis not present

## 2013-03-05 DIAGNOSIS — M4716 Other spondylosis with myelopathy, lumbar region: Secondary | ICD-10-CM | POA: Diagnosis not present

## 2013-03-05 DIAGNOSIS — M47817 Spondylosis without myelopathy or radiculopathy, lumbosacral region: Secondary | ICD-10-CM | POA: Diagnosis not present

## 2013-03-05 DIAGNOSIS — G9332 Myalgic encephalomyelitis/chronic fatigue syndrome: Secondary | ICD-10-CM | POA: Diagnosis not present

## 2013-03-05 DIAGNOSIS — F3289 Other specified depressive episodes: Secondary | ICD-10-CM | POA: Diagnosis not present

## 2013-03-05 DIAGNOSIS — R5382 Chronic fatigue, unspecified: Secondary | ICD-10-CM | POA: Diagnosis not present

## 2013-03-05 DIAGNOSIS — M961 Postlaminectomy syndrome, not elsewhere classified: Secondary | ICD-10-CM | POA: Diagnosis not present

## 2013-03-05 DIAGNOSIS — M5137 Other intervertebral disc degeneration, lumbosacral region: Secondary | ICD-10-CM | POA: Diagnosis not present

## 2013-03-05 DIAGNOSIS — IMO0001 Reserved for inherently not codable concepts without codable children: Secondary | ICD-10-CM | POA: Diagnosis not present

## 2013-04-07 ENCOUNTER — Other Ambulatory Visit: Payer: Self-pay | Admitting: Cardiology

## 2013-04-07 MED ORDER — ROSUVASTATIN CALCIUM 20 MG PO TABS: 20.0000 mg | ORAL_TABLET | Freq: Every day | ORAL | Status: DC

## 2013-04-13 ENCOUNTER — Ambulatory Visit: Payer: Self-pay | Admitting: Pain Medicine

## 2013-04-13 DIAGNOSIS — G894 Chronic pain syndrome: Secondary | ICD-10-CM | POA: Diagnosis not present

## 2013-04-13 DIAGNOSIS — M545 Low back pain, unspecified: Secondary | ICD-10-CM | POA: Diagnosis not present

## 2013-04-13 DIAGNOSIS — M169 Osteoarthritis of hip, unspecified: Secondary | ICD-10-CM | POA: Diagnosis not present

## 2013-04-13 DIAGNOSIS — F3289 Other specified depressive episodes: Secondary | ICD-10-CM | POA: Diagnosis not present

## 2013-04-13 DIAGNOSIS — M1610 Unilateral primary osteoarthritis, unspecified hip: Secondary | ICD-10-CM | POA: Diagnosis not present

## 2013-04-13 DIAGNOSIS — M5137 Other intervertebral disc degeneration, lumbosacral region: Secondary | ICD-10-CM | POA: Diagnosis not present

## 2013-04-13 DIAGNOSIS — M5030 Other cervical disc degeneration, unspecified cervical region: Secondary | ICD-10-CM | POA: Diagnosis not present

## 2013-04-13 DIAGNOSIS — F329 Major depressive disorder, single episode, unspecified: Secondary | ICD-10-CM | POA: Diagnosis not present

## 2013-04-13 DIAGNOSIS — M47817 Spondylosis without myelopathy or radiculopathy, lumbosacral region: Secondary | ICD-10-CM | POA: Diagnosis not present

## 2013-04-13 DIAGNOSIS — G9332 Myalgic encephalomyelitis/chronic fatigue syndrome: Secondary | ICD-10-CM | POA: Diagnosis not present

## 2013-04-13 DIAGNOSIS — M25569 Pain in unspecified knee: Secondary | ICD-10-CM | POA: Diagnosis not present

## 2013-04-13 DIAGNOSIS — M25559 Pain in unspecified hip: Secondary | ICD-10-CM | POA: Diagnosis not present

## 2013-04-13 DIAGNOSIS — M79609 Pain in unspecified limb: Secondary | ICD-10-CM | POA: Diagnosis not present

## 2013-04-13 DIAGNOSIS — M4716 Other spondylosis with myelopathy, lumbar region: Secondary | ICD-10-CM | POA: Diagnosis not present

## 2013-04-13 DIAGNOSIS — IMO0001 Reserved for inherently not codable concepts without codable children: Secondary | ICD-10-CM | POA: Diagnosis not present

## 2013-04-13 DIAGNOSIS — Z79899 Other long term (current) drug therapy: Secondary | ICD-10-CM | POA: Diagnosis not present

## 2013-04-13 DIAGNOSIS — R5382 Chronic fatigue, unspecified: Secondary | ICD-10-CM | POA: Diagnosis not present

## 2013-04-13 DIAGNOSIS — G8929 Other chronic pain: Secondary | ICD-10-CM | POA: Diagnosis not present

## 2013-04-13 DIAGNOSIS — M47812 Spondylosis without myelopathy or radiculopathy, cervical region: Secondary | ICD-10-CM | POA: Diagnosis not present

## 2013-04-16 ENCOUNTER — Ambulatory Visit: Payer: Self-pay | Admitting: Pain Medicine

## 2013-04-16 DIAGNOSIS — IMO0002 Reserved for concepts with insufficient information to code with codable children: Secondary | ICD-10-CM | POA: Diagnosis not present

## 2013-04-16 DIAGNOSIS — M76899 Other specified enthesopathies of unspecified lower limb, excluding foot: Secondary | ICD-10-CM | POA: Diagnosis not present

## 2013-04-16 DIAGNOSIS — M169 Osteoarthritis of hip, unspecified: Secondary | ICD-10-CM | POA: Diagnosis not present

## 2013-04-16 DIAGNOSIS — M1610 Unilateral primary osteoarthritis, unspecified hip: Secondary | ICD-10-CM | POA: Diagnosis not present

## 2013-05-18 DIAGNOSIS — R350 Frequency of micturition: Secondary | ICD-10-CM | POA: Diagnosis not present

## 2013-05-18 DIAGNOSIS — F339 Major depressive disorder, recurrent, unspecified: Secondary | ICD-10-CM | POA: Diagnosis not present

## 2013-05-18 DIAGNOSIS — N760 Acute vaginitis: Secondary | ICD-10-CM | POA: Diagnosis not present

## 2013-05-21 DIAGNOSIS — G4733 Obstructive sleep apnea (adult) (pediatric): Secondary | ICD-10-CM | POA: Diagnosis not present

## 2013-05-21 DIAGNOSIS — G2581 Restless legs syndrome: Secondary | ICD-10-CM | POA: Diagnosis not present

## 2013-05-29 ENCOUNTER — Encounter: Payer: Self-pay | Admitting: Internal Medicine

## 2013-06-02 DIAGNOSIS — M9981 Other biomechanical lesions of cervical region: Secondary | ICD-10-CM | POA: Diagnosis not present

## 2013-06-02 DIAGNOSIS — M999 Biomechanical lesion, unspecified: Secondary | ICD-10-CM | POA: Diagnosis not present

## 2013-06-02 DIAGNOSIS — M5380 Other specified dorsopathies, site unspecified: Secondary | ICD-10-CM | POA: Diagnosis not present

## 2013-06-02 DIAGNOSIS — M436 Torticollis: Secondary | ICD-10-CM | POA: Diagnosis not present

## 2013-06-08 DIAGNOSIS — M999 Biomechanical lesion, unspecified: Secondary | ICD-10-CM | POA: Diagnosis not present

## 2013-06-08 DIAGNOSIS — M9981 Other biomechanical lesions of cervical region: Secondary | ICD-10-CM | POA: Diagnosis not present

## 2013-06-08 DIAGNOSIS — M436 Torticollis: Secondary | ICD-10-CM | POA: Diagnosis not present

## 2013-06-08 DIAGNOSIS — M5380 Other specified dorsopathies, site unspecified: Secondary | ICD-10-CM | POA: Diagnosis not present

## 2013-06-10 DIAGNOSIS — L821 Other seborrheic keratosis: Secondary | ICD-10-CM | POA: Diagnosis not present

## 2013-06-10 DIAGNOSIS — D1800 Hemangioma unspecified site: Secondary | ICD-10-CM | POA: Diagnosis not present

## 2013-06-11 DIAGNOSIS — M9981 Other biomechanical lesions of cervical region: Secondary | ICD-10-CM | POA: Diagnosis not present

## 2013-06-11 DIAGNOSIS — M436 Torticollis: Secondary | ICD-10-CM | POA: Diagnosis not present

## 2013-06-11 DIAGNOSIS — M999 Biomechanical lesion, unspecified: Secondary | ICD-10-CM | POA: Diagnosis not present

## 2013-06-11 DIAGNOSIS — M5380 Other specified dorsopathies, site unspecified: Secondary | ICD-10-CM | POA: Diagnosis not present

## 2013-06-16 DIAGNOSIS — M999 Biomechanical lesion, unspecified: Secondary | ICD-10-CM | POA: Diagnosis not present

## 2013-06-16 DIAGNOSIS — M5380 Other specified dorsopathies, site unspecified: Secondary | ICD-10-CM | POA: Diagnosis not present

## 2013-06-16 DIAGNOSIS — M436 Torticollis: Secondary | ICD-10-CM | POA: Diagnosis not present

## 2013-06-16 DIAGNOSIS — M9981 Other biomechanical lesions of cervical region: Secondary | ICD-10-CM | POA: Diagnosis not present

## 2013-06-17 ENCOUNTER — Ambulatory Visit: Payer: Self-pay | Admitting: Pain Medicine

## 2013-06-17 DIAGNOSIS — R5382 Chronic fatigue, unspecified: Secondary | ICD-10-CM | POA: Diagnosis not present

## 2013-06-17 DIAGNOSIS — M5030 Other cervical disc degeneration, unspecified cervical region: Secondary | ICD-10-CM | POA: Diagnosis not present

## 2013-06-17 DIAGNOSIS — F329 Major depressive disorder, single episode, unspecified: Secondary | ICD-10-CM | POA: Diagnosis not present

## 2013-06-17 DIAGNOSIS — G8929 Other chronic pain: Secondary | ICD-10-CM | POA: Diagnosis not present

## 2013-06-17 DIAGNOSIS — M1610 Unilateral primary osteoarthritis, unspecified hip: Secondary | ICD-10-CM | POA: Diagnosis not present

## 2013-06-17 DIAGNOSIS — IMO0001 Reserved for inherently not codable concepts without codable children: Secondary | ICD-10-CM | POA: Diagnosis not present

## 2013-06-17 DIAGNOSIS — F3289 Other specified depressive episodes: Secondary | ICD-10-CM | POA: Diagnosis not present

## 2013-06-17 DIAGNOSIS — G609 Hereditary and idiopathic neuropathy, unspecified: Secondary | ICD-10-CM | POA: Diagnosis not present

## 2013-06-17 DIAGNOSIS — M4716 Other spondylosis with myelopathy, lumbar region: Secondary | ICD-10-CM | POA: Diagnosis not present

## 2013-06-17 DIAGNOSIS — M47812 Spondylosis without myelopathy or radiculopathy, cervical region: Secondary | ICD-10-CM | POA: Diagnosis not present

## 2013-06-17 DIAGNOSIS — M5137 Other intervertebral disc degeneration, lumbosacral region: Secondary | ICD-10-CM | POA: Diagnosis not present

## 2013-06-17 DIAGNOSIS — Z79899 Other long term (current) drug therapy: Secondary | ICD-10-CM | POA: Diagnosis not present

## 2013-06-17 DIAGNOSIS — G9332 Myalgic encephalomyelitis/chronic fatigue syndrome: Secondary | ICD-10-CM | POA: Diagnosis not present

## 2013-06-17 DIAGNOSIS — M961 Postlaminectomy syndrome, not elsewhere classified: Secondary | ICD-10-CM | POA: Diagnosis not present

## 2013-06-17 DIAGNOSIS — M169 Osteoarthritis of hip, unspecified: Secondary | ICD-10-CM | POA: Diagnosis not present

## 2013-06-29 ENCOUNTER — Ambulatory Visit: Payer: Self-pay | Admitting: Pain Medicine

## 2013-06-29 DIAGNOSIS — M47817 Spondylosis without myelopathy or radiculopathy, lumbosacral region: Secondary | ICD-10-CM | POA: Diagnosis not present

## 2013-06-29 DIAGNOSIS — M25569 Pain in unspecified knee: Secondary | ICD-10-CM | POA: Diagnosis not present

## 2013-06-29 DIAGNOSIS — M4804 Spinal stenosis, thoracic region: Secondary | ICD-10-CM | POA: Diagnosis not present

## 2013-06-29 DIAGNOSIS — IMO0002 Reserved for concepts with insufficient information to code with codable children: Secondary | ICD-10-CM | POA: Diagnosis not present

## 2013-06-29 DIAGNOSIS — M545 Low back pain, unspecified: Secondary | ICD-10-CM | POA: Diagnosis not present

## 2013-06-29 DIAGNOSIS — M5137 Other intervertebral disc degeneration, lumbosacral region: Secondary | ICD-10-CM | POA: Diagnosis not present

## 2013-06-30 ENCOUNTER — Ambulatory Visit: Payer: Self-pay | Admitting: Pain Medicine

## 2013-06-30 DIAGNOSIS — G894 Chronic pain syndrome: Secondary | ICD-10-CM | POA: Diagnosis not present

## 2013-06-30 DIAGNOSIS — M1710 Unilateral primary osteoarthritis, unspecified knee: Secondary | ICD-10-CM | POA: Diagnosis not present

## 2013-06-30 DIAGNOSIS — S83289A Other tear of lateral meniscus, current injury, unspecified knee, initial encounter: Secondary | ICD-10-CM | POA: Diagnosis not present

## 2013-06-30 DIAGNOSIS — M2240 Chondromalacia patellae, unspecified knee: Secondary | ICD-10-CM | POA: Diagnosis not present

## 2013-06-30 DIAGNOSIS — M25469 Effusion, unspecified knee: Secondary | ICD-10-CM | POA: Diagnosis not present

## 2013-06-30 DIAGNOSIS — IMO0002 Reserved for concepts with insufficient information to code with codable children: Secondary | ICD-10-CM | POA: Diagnosis not present

## 2013-06-30 LAB — BASIC METABOLIC PANEL
Anion Gap: 1 — ABNORMAL LOW (ref 7–16)
BUN: 19 mg/dL — ABNORMAL HIGH (ref 7–18)
Calcium, Total: 9.7 mg/dL (ref 8.5–10.1)
Chloride: 102 mmol/L (ref 98–107)
Co2: 32 mmol/L (ref 21–32)
Creatinine: 0.89 mg/dL (ref 0.60–1.30)
EGFR (African American): >60
EGFR (Non-African Amer.): >60
Glucose: 143 mg/dL — ABNORMAL HIGH (ref 65–99)
Osmolality: 275 (ref 275–301)
Potassium: 4.1 mmol/L (ref 3.5–5.1)
Sodium: 135 mmol/L — ABNORMAL LOW (ref 136–145)

## 2013-06-30 LAB — MAGNESIUM: Magnesium: 2.1 mg/dL

## 2013-07-15 DIAGNOSIS — M25569 Pain in unspecified knee: Secondary | ICD-10-CM | POA: Diagnosis not present

## 2013-07-17 ENCOUNTER — Ambulatory Visit: Payer: Self-pay | Admitting: Podiatry

## 2013-07-30 ENCOUNTER — Ambulatory Visit: Payer: Self-pay | Admitting: Pain Medicine

## 2013-07-30 DIAGNOSIS — IMO0001 Reserved for inherently not codable concepts without codable children: Secondary | ICD-10-CM | POA: Diagnosis not present

## 2013-07-30 DIAGNOSIS — Z79899 Other long term (current) drug therapy: Secondary | ICD-10-CM | POA: Diagnosis not present

## 2013-07-30 DIAGNOSIS — M961 Postlaminectomy syndrome, not elsewhere classified: Secondary | ICD-10-CM | POA: Diagnosis not present

## 2013-07-30 DIAGNOSIS — Z5181 Encounter for therapeutic drug level monitoring: Secondary | ICD-10-CM | POA: Diagnosis not present

## 2013-07-30 DIAGNOSIS — M169 Osteoarthritis of hip, unspecified: Secondary | ICD-10-CM | POA: Diagnosis not present

## 2013-07-30 DIAGNOSIS — M79609 Pain in unspecified limb: Secondary | ICD-10-CM | POA: Diagnosis not present

## 2013-07-30 DIAGNOSIS — M4712 Other spondylosis with myelopathy, cervical region: Secondary | ICD-10-CM | POA: Diagnosis not present

## 2013-07-30 DIAGNOSIS — M5137 Other intervertebral disc degeneration, lumbosacral region: Secondary | ICD-10-CM | POA: Diagnosis not present

## 2013-07-30 DIAGNOSIS — M25569 Pain in unspecified knee: Secondary | ICD-10-CM | POA: Diagnosis not present

## 2013-07-30 DIAGNOSIS — M1610 Unilateral primary osteoarthritis, unspecified hip: Secondary | ICD-10-CM | POA: Diagnosis not present

## 2013-07-30 DIAGNOSIS — G894 Chronic pain syndrome: Secondary | ICD-10-CM | POA: Diagnosis not present

## 2013-07-30 DIAGNOSIS — M545 Low back pain, unspecified: Secondary | ICD-10-CM | POA: Diagnosis not present

## 2013-07-30 DIAGNOSIS — M5126 Other intervertebral disc displacement, lumbar region: Secondary | ICD-10-CM | POA: Diagnosis not present

## 2013-07-30 DIAGNOSIS — F329 Major depressive disorder, single episode, unspecified: Secondary | ICD-10-CM | POA: Diagnosis not present

## 2013-07-30 DIAGNOSIS — M5030 Other cervical disc degeneration, unspecified cervical region: Secondary | ICD-10-CM | POA: Diagnosis not present

## 2013-07-30 DIAGNOSIS — M4716 Other spondylosis with myelopathy, lumbar region: Secondary | ICD-10-CM | POA: Diagnosis not present

## 2013-07-30 DIAGNOSIS — M47817 Spondylosis without myelopathy or radiculopathy, lumbosacral region: Secondary | ICD-10-CM | POA: Diagnosis not present

## 2013-07-30 DIAGNOSIS — G609 Hereditary and idiopathic neuropathy, unspecified: Secondary | ICD-10-CM | POA: Diagnosis not present

## 2013-07-30 DIAGNOSIS — F3289 Other specified depressive episodes: Secondary | ICD-10-CM | POA: Diagnosis not present

## 2013-08-04 ENCOUNTER — Ambulatory Visit (INDEPENDENT_AMBULATORY_CARE_PROVIDER_SITE_OTHER): Payer: Medicare Other

## 2013-08-04 ENCOUNTER — Ambulatory Visit (INDEPENDENT_AMBULATORY_CARE_PROVIDER_SITE_OTHER): Payer: Medicare Other | Admitting: Podiatry

## 2013-08-04 ENCOUNTER — Encounter: Payer: Self-pay | Admitting: Podiatry

## 2013-08-04 VITALS — BP 141/74 | HR 85 | Resp 16 | Ht 70.0 in | Wt 230.0 lb

## 2013-08-04 DIAGNOSIS — M79609 Pain in unspecified limb: Secondary | ICD-10-CM

## 2013-08-04 DIAGNOSIS — L97509 Non-pressure chronic ulcer of other part of unspecified foot with unspecified severity: Secondary | ICD-10-CM | POA: Diagnosis not present

## 2013-08-04 DIAGNOSIS — M79673 Pain in unspecified foot: Secondary | ICD-10-CM

## 2013-08-04 DIAGNOSIS — G589 Mononeuropathy, unspecified: Secondary | ICD-10-CM

## 2013-08-04 MED ORDER — SULFAMETHOXAZOLE-TMP DS 800-160 MG PO TABS: 1.0000 | ORAL_TABLET | Freq: Two times a day (BID) | ORAL | Status: DC

## 2013-08-04 NOTE — Progress Notes (Signed)
   Subjective:    Patient ID: Melinda [name], female    DOB: Aug 15, 1955, 58 y.o.   MRN: [id number]  HPI Comments: N pain L bottom of left foot on the ball under toes/ great toe pain D open 1 month / callus over 1 year O slowly C worse A walking,standing, moist with sock T betadine gel, soaks in epson salt, pts partner trims callus,     N AT TIMES PAIN L CALLUS RIGHT FOOT D 1 YEAR O SLOWLY C BETTER A WALKING STANDING T WENT TO WOUND CARE AT [facility], BETADINE GEL, EPSON SALT SOAKS, TRIMMED CALLUS, X-RAYS,   Foot Pain Associated symptoms include fatigue and weakness.      Review of Systems  Constitutional: Positive for fatigue.  HENT: Negative.   Eyes: Negative.   Respiratory: Negative.   Cardiovascular: Negative.   Gastrointestinal: Positive for constipation.  Endocrine: Negative.   Genitourinary: Negative.   Musculoskeletal: Positive for back pain.       MUSCLE PAIN  Skin:       OPEN SORES  Allergic/Immunologic: Negative.   Neurological: Positive for weakness.  Hematological: Negative.   Psychiatric/Behavioral: Negative.        Objective:   Physical Exam        Assessment & Plan:

## 2013-08-04 NOTE — Progress Notes (Signed)
Subjective:     Patient ID: Melinda [name], female   DOB: [birth year], 58 y.o.   MRN: [id number]  Foot Pain   patient states that she has had a spot underneath her left big toe for the last month that she has been watching and it has been getting sore and she did lose the big toe on her right foot do to infection and she wanted to get it checked area lost the big toe about 2 years ago  Review of Systems  All other systems reviewed and are negative.       Objective:   Physical Exam  Nursing note and vitals reviewed. Constitutional: She is oriented to person, place, and time.  Cardiovascular: Intact distal pulses.   Musculoskeletal: Normal range of motion.  Neurological: She is oriented to person, place, and time.  Skin: Skin is warm.   neurological sensation is diminished in both feet and vascular sensation is found to be within normal limits. Muscle strength was adequate with no equinus condition and the right hallux has been amputated do to previous infection. Underneath the left first metatarsal head there is a keratotic lesion that is localized with no odor or drainage noted. There is mild warmth around the area and there is keratotic tissue formation associated with    Assessment:     Lesions sub-first metatarsal left secondary to neuropathy and pressure that she is putting on this with no indications of systemic infection    Plan:     H&P and x-ray reviewed with patient. At this time I debrided the tissue flushed and applied a small amount of Iodosorb to a central area measuring about 5 x 5 mm and instructed on wearing wedge shoe which she has at home along with soaks and Iodosorb that she has at home. Placed on Septra DS for 10 days and instructed to reappoint in 2 weeks earlier if any issues should occur

## 2013-08-18 ENCOUNTER — Ambulatory Visit: Payer: Medicare Other | Admitting: Podiatry

## 2013-08-20 DIAGNOSIS — M23359 Other meniscus derangements, posterior horn of lateral meniscus, unspecified knee: Secondary | ICD-10-CM | POA: Diagnosis not present

## 2013-08-20 DIAGNOSIS — M23329 Other meniscus derangements, posterior horn of medial meniscus, unspecified knee: Secondary | ICD-10-CM | POA: Diagnosis not present

## 2013-08-20 DIAGNOSIS — M2240 Chondromalacia patellae, unspecified knee: Secondary | ICD-10-CM | POA: Diagnosis not present

## 2013-08-20 DIAGNOSIS — S83289A Other tear of lateral meniscus, current injury, unspecified knee, initial encounter: Secondary | ICD-10-CM | POA: Diagnosis not present

## 2013-08-20 DIAGNOSIS — M1710 Unilateral primary osteoarthritis, unspecified knee: Secondary | ICD-10-CM | POA: Diagnosis not present

## 2013-08-20 DIAGNOSIS — IMO0002 Reserved for concepts with insufficient information to code with codable children: Secondary | ICD-10-CM | POA: Diagnosis not present

## 2013-09-08 ENCOUNTER — Encounter: Payer: Self-pay | Admitting: Orthopaedic Surgery

## 2013-09-08 DIAGNOSIS — M6281 Muscle weakness (generalized): Secondary | ICD-10-CM | POA: Diagnosis not present

## 2013-09-08 DIAGNOSIS — M545 Low back pain, unspecified: Secondary | ICD-10-CM | POA: Diagnosis not present

## 2013-09-08 DIAGNOSIS — M25669 Stiffness of unspecified knee, not elsewhere classified: Secondary | ICD-10-CM | POA: Diagnosis not present

## 2013-09-08 DIAGNOSIS — IMO0001 Reserved for inherently not codable concepts without codable children: Secondary | ICD-10-CM | POA: Diagnosis not present

## 2013-09-30 ENCOUNTER — Ambulatory Visit: Payer: Self-pay | Admitting: Pain Medicine

## 2013-09-30 DIAGNOSIS — F329 Major depressive disorder, single episode, unspecified: Secondary | ICD-10-CM | POA: Diagnosis not present

## 2013-09-30 DIAGNOSIS — M4716 Other spondylosis with myelopathy, lumbar region: Secondary | ICD-10-CM | POA: Diagnosis not present

## 2013-09-30 DIAGNOSIS — M1610 Unilateral primary osteoarthritis, unspecified hip: Secondary | ICD-10-CM | POA: Diagnosis not present

## 2013-09-30 DIAGNOSIS — M961 Postlaminectomy syndrome, not elsewhere classified: Secondary | ICD-10-CM | POA: Diagnosis not present

## 2013-09-30 DIAGNOSIS — A419 Sepsis, unspecified organism: Secondary | ICD-10-CM

## 2013-09-30 DIAGNOSIS — R5382 Chronic fatigue, unspecified: Secondary | ICD-10-CM | POA: Diagnosis not present

## 2013-09-30 DIAGNOSIS — M5126 Other intervertebral disc displacement, lumbar region: Secondary | ICD-10-CM | POA: Diagnosis not present

## 2013-09-30 DIAGNOSIS — G8929 Other chronic pain: Secondary | ICD-10-CM | POA: Diagnosis not present

## 2013-09-30 DIAGNOSIS — M47812 Spondylosis without myelopathy or radiculopathy, cervical region: Secondary | ICD-10-CM | POA: Diagnosis not present

## 2013-09-30 DIAGNOSIS — M5137 Other intervertebral disc degeneration, lumbosacral region: Secondary | ICD-10-CM | POA: Diagnosis not present

## 2013-09-30 DIAGNOSIS — M79609 Pain in unspecified limb: Secondary | ICD-10-CM | POA: Diagnosis not present

## 2013-09-30 DIAGNOSIS — Z79899 Other long term (current) drug therapy: Secondary | ICD-10-CM | POA: Diagnosis not present

## 2013-09-30 DIAGNOSIS — M169 Osteoarthritis of hip, unspecified: Secondary | ICD-10-CM | POA: Diagnosis not present

## 2013-09-30 DIAGNOSIS — IMO0001 Reserved for inherently not codable concepts without codable children: Secondary | ICD-10-CM | POA: Diagnosis not present

## 2013-09-30 DIAGNOSIS — M545 Low back pain, unspecified: Secondary | ICD-10-CM | POA: Diagnosis not present

## 2013-09-30 DIAGNOSIS — M47817 Spondylosis without myelopathy or radiculopathy, lumbosacral region: Secondary | ICD-10-CM | POA: Diagnosis not present

## 2013-09-30 DIAGNOSIS — F3289 Other specified depressive episodes: Secondary | ICD-10-CM | POA: Diagnosis not present

## 2013-09-30 DIAGNOSIS — M5030 Other cervical disc degeneration, unspecified cervical region: Secondary | ICD-10-CM | POA: Diagnosis not present

## 2013-09-30 DIAGNOSIS — G9332 Myalgic encephalomyelitis/chronic fatigue syndrome: Secondary | ICD-10-CM | POA: Diagnosis not present

## 2013-09-30 HISTORY — DX: Sepsis, unspecified organism: A41.9

## 2013-10-01 ENCOUNTER — Encounter: Payer: Self-pay | Admitting: Orthopaedic Surgery

## 2013-10-03 DIAGNOSIS — J069 Acute upper respiratory infection, unspecified: Secondary | ICD-10-CM | POA: Diagnosis not present

## 2013-10-04 ENCOUNTER — Inpatient Hospital Stay (HOSPITAL_COMMUNITY)
Admission: EM | Admit: 2013-10-04 | Discharge: 2013-10-11 | DRG: 872 | Disposition: A | Discharge status: Home / Self Care | Payer: Medicare Other | Attending: Internal Medicine | Admitting: Internal Medicine

## 2013-10-04 ENCOUNTER — Emergency Department (HOSPITAL_COMMUNITY): Payer: Medicare Other

## 2013-10-04 ENCOUNTER — Encounter (HOSPITAL_COMMUNITY): Payer: Self-pay | Admitting: Emergency Medicine

## 2013-10-04 DIAGNOSIS — R0789 Other chest pain: Secondary | ICD-10-CM | POA: Diagnosis not present

## 2013-10-04 DIAGNOSIS — Z87891 Personal history of nicotine dependence: Secondary | ICD-10-CM

## 2013-10-04 DIAGNOSIS — B954 Other streptococcus as the cause of diseases classified elsewhere: Secondary | ICD-10-CM | POA: Diagnosis not present

## 2013-10-04 DIAGNOSIS — Z6833 Body mass index (BMI) 33.0-33.9, adult: Secondary | ICD-10-CM | POA: Diagnosis not present

## 2013-10-04 DIAGNOSIS — I2510 Atherosclerotic heart disease of native coronary artery without angina pectoris: Secondary | ICD-10-CM | POA: Insufficient documentation

## 2013-10-04 DIAGNOSIS — G894 Chronic pain syndrome: Secondary | ICD-10-CM

## 2013-10-04 DIAGNOSIS — N390 Urinary tract infection, site not specified: Secondary | ICD-10-CM | POA: Diagnosis not present

## 2013-10-04 DIAGNOSIS — R059 Cough, unspecified: Secondary | ICD-10-CM | POA: Diagnosis not present

## 2013-10-04 DIAGNOSIS — Z7982 Long term (current) use of aspirin: Secondary | ICD-10-CM | POA: Diagnosis not present

## 2013-10-04 DIAGNOSIS — A419 Sepsis, unspecified organism: Secondary | ICD-10-CM

## 2013-10-04 DIAGNOSIS — E669 Obesity, unspecified: Secondary | ICD-10-CM | POA: Diagnosis present

## 2013-10-04 DIAGNOSIS — Z79899 Other long term (current) drug therapy: Secondary | ICD-10-CM

## 2013-10-04 DIAGNOSIS — D6959 Other secondary thrombocytopenia: Secondary | ICD-10-CM | POA: Diagnosis present

## 2013-10-04 DIAGNOSIS — Z9861 Coronary angioplasty status: Secondary | ICD-10-CM

## 2013-10-04 DIAGNOSIS — IMO0001 Reserved for inherently not codable concepts without codable children: Secondary | ICD-10-CM | POA: Diagnosis present

## 2013-10-04 DIAGNOSIS — J40 Bronchitis, not specified as acute or chronic: Secondary | ICD-10-CM | POA: Diagnosis present

## 2013-10-04 DIAGNOSIS — M25579 Pain in unspecified ankle and joints of unspecified foot: Secondary | ICD-10-CM

## 2013-10-04 DIAGNOSIS — G8929 Other chronic pain: Secondary | ICD-10-CM | POA: Diagnosis present

## 2013-10-04 DIAGNOSIS — N179 Acute kidney failure, unspecified: Secondary | ICD-10-CM

## 2013-10-04 DIAGNOSIS — G589 Mononeuropathy, unspecified: Secondary | ICD-10-CM

## 2013-10-04 DIAGNOSIS — R748 Abnormal levels of other serum enzymes: Secondary | ICD-10-CM | POA: Diagnosis present

## 2013-10-04 DIAGNOSIS — R5381 Other malaise: Secondary | ICD-10-CM | POA: Diagnosis present

## 2013-10-04 DIAGNOSIS — Z801 Family history of malignant neoplasm of trachea, bronchus and lung: Secondary | ICD-10-CM

## 2013-10-04 DIAGNOSIS — D689 Coagulation defect, unspecified: Secondary | ICD-10-CM | POA: Diagnosis present

## 2013-10-04 DIAGNOSIS — R51 Headache: Secondary | ICD-10-CM | POA: Diagnosis present

## 2013-10-04 DIAGNOSIS — R5383 Other fatigue: Secondary | ICD-10-CM

## 2013-10-04 DIAGNOSIS — D649 Anemia, unspecified: Secondary | ICD-10-CM | POA: Diagnosis not present

## 2013-10-04 DIAGNOSIS — S98119A Complete traumatic amputation of unspecified great toe, initial encounter: Secondary | ICD-10-CM

## 2013-10-04 DIAGNOSIS — Z833 Family history of diabetes mellitus: Secondary | ICD-10-CM | POA: Diagnosis not present

## 2013-10-04 DIAGNOSIS — B9789 Other viral agents as the cause of diseases classified elsewhere: Secondary | ICD-10-CM | POA: Diagnosis present

## 2013-10-04 DIAGNOSIS — E872 Acidosis, unspecified: Secondary | ICD-10-CM | POA: Diagnosis present

## 2013-10-04 DIAGNOSIS — R112 Nausea with vomiting, unspecified: Secondary | ICD-10-CM | POA: Diagnosis present

## 2013-10-04 DIAGNOSIS — Z8249 Family history of ischemic heart disease and other diseases of the circulatory system: Secondary | ICD-10-CM

## 2013-10-04 DIAGNOSIS — M4800 Spinal stenosis, site unspecified: Secondary | ICD-10-CM | POA: Diagnosis present

## 2013-10-04 DIAGNOSIS — Z9189 Other specified personal risk factors, not elsewhere classified: Secondary | ICD-10-CM

## 2013-10-04 DIAGNOSIS — R05 Cough: Secondary | ICD-10-CM

## 2013-10-04 DIAGNOSIS — R079 Chest pain, unspecified: Secondary | ICD-10-CM

## 2013-10-04 DIAGNOSIS — R509 Fever, unspecified: Secondary | ICD-10-CM | POA: Diagnosis not present

## 2013-10-04 DIAGNOSIS — R42 Dizziness and giddiness: Secondary | ICD-10-CM | POA: Diagnosis present

## 2013-10-04 DIAGNOSIS — A4902 Methicillin resistant Staphylococcus aureus infection, unspecified site: Secondary | ICD-10-CM

## 2013-10-04 DIAGNOSIS — R7881 Bacteremia: Secondary | ICD-10-CM | POA: Diagnosis not present

## 2013-10-04 DIAGNOSIS — A409 Streptococcal sepsis, unspecified: Principal | ICD-10-CM | POA: Diagnosis present

## 2013-10-04 DIAGNOSIS — M25569 Pain in unspecified knee: Secondary | ICD-10-CM | POA: Diagnosis present

## 2013-10-04 DIAGNOSIS — J069 Acute upper respiratory infection, unspecified: Secondary | ICD-10-CM

## 2013-10-04 DIAGNOSIS — M86679 Other chronic osteomyelitis, unspecified ankle and foot: Secondary | ICD-10-CM

## 2013-10-04 DIAGNOSIS — Z8614 Personal history of Methicillin resistant Staphylococcus aureus infection: Secondary | ICD-10-CM | POA: Diagnosis not present

## 2013-10-04 DIAGNOSIS — H53149 Visual discomfort, unspecified: Secondary | ICD-10-CM | POA: Diagnosis present

## 2013-10-04 DIAGNOSIS — G609 Hereditary and idiopathic neuropathy, unspecified: Secondary | ICD-10-CM | POA: Diagnosis present

## 2013-10-04 DIAGNOSIS — R0902 Hypoxemia: Secondary | ICD-10-CM | POA: Diagnosis present

## 2013-10-04 DIAGNOSIS — I959 Hypotension, unspecified: Secondary | ICD-10-CM

## 2013-10-04 DIAGNOSIS — D696 Thrombocytopenia, unspecified: Secondary | ICD-10-CM

## 2013-10-04 DIAGNOSIS — R7309 Other abnormal glucose: Secondary | ICD-10-CM | POA: Diagnosis present

## 2013-10-04 HISTORY — DX: Atherosclerotic heart disease of native coronary artery without angina pectoris: I25.10

## 2013-10-04 LAB — URINE MICROSCOPIC-ADD ON

## 2013-10-04 LAB — I-STAT CG4 LACTIC ACID, ED: Lactic Acid, Venous: 2.62 mmol/L — ABNORMAL HIGH (ref 0.5–2.2)

## 2013-10-04 LAB — CBC
HCT: 33.1 % — ABNORMAL LOW (ref 36.0–46.0)
Hemoglobin: 11.2 g/dL — ABNORMAL LOW (ref 12.0–15.0)
MCH: 29 pg (ref 26.0–34.0)
MCHC: 33.8 g/dL (ref 30.0–36.0)
MCV: 85.8 fL (ref 78.0–100.0)
Platelets: 85 10*3/uL — ABNORMAL LOW (ref 150–400)
RBC: 3.86 MIL/uL — ABNORMAL LOW (ref 3.87–5.11)
RDW: 13.6 % (ref 11.5–15.5)
WBC: 5.4 10*3/uL (ref 4.0–10.5)

## 2013-10-04 LAB — BASIC METABOLIC PANEL
BUN: 26 mg/dL — ABNORMAL HIGH (ref 6–23)
CO2: 24 mEq/L (ref 19–32)
Calcium: 8.3 mg/dL — ABNORMAL LOW (ref 8.4–10.5)
Chloride: 98 mEq/L (ref 96–112)
Creatinine, Ser: 1.44 mg/dL — ABNORMAL HIGH (ref 0.50–1.10)
GFR calc Af Amer: 46 mL/min — ABNORMAL LOW (ref >90)
GFR calc non Af Amer: 40 mL/min — ABNORMAL LOW (ref >90)
Glucose, Bld: 162 mg/dL — ABNORMAL HIGH (ref 70–99)
Potassium: 3.7 mEq/L (ref 3.7–5.3)
Sodium: 136 mEq/L — ABNORMAL LOW (ref 137–147)

## 2013-10-04 LAB — BLOOD GAS, ARTERIAL
Acid-base deficit: 1.3 mmol/L (ref 0.0–2.0)
Bicarbonate: 22.3 mEq/L (ref 20.0–24.0)
Drawn by: 307971
O2 Content: 2 L/min
O2 Saturation: 96.8 %
Patient temperature: 98.6
TCO2: 20.3 mmol/L (ref 0–100)
pCO2 arterial: 35.5 mmHg (ref 35.0–45.0)
pH, Arterial: 7.415 (ref 7.350–7.450)
pO2, Arterial: 87.2 mmHg (ref 80.0–100.0)

## 2013-10-04 LAB — URINALYSIS, ROUTINE W REFLEX MICROSCOPIC
Glucose, UA: NEGATIVE mg/dL
Hgb urine dipstick: NEGATIVE
Ketones, ur: NEGATIVE mg/dL
Nitrite: NEGATIVE
Protein, ur: 30 mg/dL — AB
Specific Gravity, Urine: 1.023 (ref 1.005–1.030)
Urobilinogen, UA: 1 mg/dL (ref 0.0–1.0)
pH: 5.5 (ref 5.0–8.0)

## 2013-10-04 LAB — HEPATIC FUNCTION PANEL
ALT: 32 U/L (ref 0–35)
AST: 39 U/L — ABNORMAL HIGH (ref 0–37)
Albumin: 3.1 g/dL — ABNORMAL LOW (ref 3.5–5.2)
Alkaline Phosphatase: 91 U/L (ref 39–117)
Bilirubin, Direct: <0.2 mg/dL (ref 0.0–0.3)
Total Bilirubin: 0.6 mg/dL (ref 0.3–1.2)
Total Protein: 6.5 g/dL (ref 6.0–8.3)

## 2013-10-04 LAB — RAPID STREP SCREEN (MED CTR MEBANE ONLY): Streptococcus, Group A Screen (Direct): NEGATIVE

## 2013-10-04 LAB — RETICULOCYTES
RBC.: 3.6 MIL/uL — ABNORMAL LOW (ref 3.87–5.11)
Retic Count, Absolute: 43.2 K/uL (ref 19.0–186.0)
Retic Ct Pct: 1.2 % (ref 0.4–3.1)

## 2013-10-04 LAB — TROPONIN I
Troponin I: <0.3 ng/mL (ref <0.30)
Troponin I: <0.3 ng/mL (ref <0.30)

## 2013-10-04 LAB — I-STAT TROPONIN, ED: Troponin i, poc: 0.06 ng/mL (ref 0.00–0.08)

## 2013-10-04 LAB — LIPASE, BLOOD: Lipase: 11 U/L (ref 11–59)

## 2013-10-04 MED ORDER — AZITHROMYCIN 500 MG IV SOLR
500.0000 mg | Freq: Once | INTRAVENOUS | Status: AC
  Administered 2013-10-04: 500 mg via INTRAVENOUS

## 2013-10-04 MED ORDER — POTASSIUM CHLORIDE IN NACL 20-0.9 MEQ/L-% IV SOLN
INTRAVENOUS | Status: DC
  Administered 2013-10-04: 150 mL/h via INTRAVENOUS
  Administered 2013-10-05 – 2013-10-08 (×3): via INTRAVENOUS
  Filled 2013-10-04 (×8): qty 1000

## 2013-10-04 MED ORDER — ACETAMINOPHEN 325 MG PO TABS
650.0000 mg | ORAL_TABLET | Freq: Once | ORAL | Status: AC
  Administered 2013-10-04: 650 mg via ORAL
  Filled 2013-10-04: qty 2

## 2013-10-04 MED ORDER — SODIUM CHLORIDE 0.9 % IV BOLUS (SEPSIS)
1000.0000 mL | Freq: Once | INTRAVENOUS | Status: AC
  Administered 2013-10-04: 1000 mL via INTRAVENOUS

## 2013-10-04 MED ORDER — OSELTAMIVIR PHOSPHATE 75 MG PO CAPS
75.0000 mg | ORAL_CAPSULE | Freq: Two times a day (BID) | ORAL | Status: DC
  Administered 2013-10-04 – 2013-10-06 (×4): 75 mg via ORAL
  Filled 2013-10-04 (×5): qty 1

## 2013-10-04 MED ORDER — ONDANSETRON HCL 4 MG PO TABS: 4.0000 mg | ORAL_TABLET | Freq: Four times a day (QID) | ORAL | Status: DC | PRN

## 2013-10-04 MED ORDER — GUAIFENESIN-DM 100-10 MG/5ML PO SYRP: 5.0000 mL | ORAL_SOLUTION | ORAL | Status: DC | PRN

## 2013-10-04 MED ORDER — ISOSORBIDE DINITRATE 30 MG PO TABS
30.0000 mg | ORAL_TABLET | Freq: Every day | ORAL | Status: DC
  Administered 2013-10-05 – 2013-10-11 (×7): 30 mg via ORAL
  Filled 2013-10-04 (×7): qty 1

## 2013-10-04 MED ORDER — VANCOMYCIN HCL 10 G IV SOLR
2000.0000 mg | Freq: Once | INTRAVENOUS | Status: AC
  Administered 2013-10-04: 2000 mg via INTRAVENOUS
  Filled 2013-10-04: qty 2000

## 2013-10-04 MED ORDER — PREGABALIN 75 MG PO CAPS
75.0000 mg | ORAL_CAPSULE | Freq: Every day | ORAL | Status: DC
  Administered 2013-10-05 – 2013-10-11 (×7): 75 mg via ORAL
  Filled 2013-10-04 (×7): qty 1

## 2013-10-04 MED ORDER — ATORVASTATIN CALCIUM 40 MG PO TABS
40.0000 mg | ORAL_TABLET | Freq: Every day | ORAL | Status: DC
  Administered 2013-10-06 – 2013-10-10 (×5): 40 mg via ORAL
  Filled 2013-10-04 (×7): qty 1

## 2013-10-04 MED ORDER — PAROXETINE HCL 10 MG PO TABS
10.0000 mg | ORAL_TABLET | Freq: Every day | ORAL | Status: DC
  Administered 2013-10-05 – 2013-10-11 (×7): 10 mg via ORAL
  Filled 2013-10-04 (×7): qty 1

## 2013-10-04 MED ORDER — TIZANIDINE HCL 4 MG PO TABS
4.0000 mg | ORAL_TABLET | Freq: Three times a day (TID) | ORAL | Status: DC | PRN
  Administered 2013-10-05 – 2013-10-06 (×2): 4 mg via ORAL
  Filled 2013-10-04 (×4): qty 1

## 2013-10-04 MED ORDER — ACETAMINOPHEN 325 MG PO TABS
650.0000 mg | ORAL_TABLET | Freq: Four times a day (QID) | ORAL | Status: DC | PRN
  Administered 2013-10-05 – 2013-10-11 (×15): 650 mg via ORAL
  Filled 2013-10-04 (×16): qty 2

## 2013-10-04 MED ORDER — ASPIRIN EC 81 MG PO TBEC
81.0000 mg | DELAYED_RELEASE_TABLET | Freq: Every day | ORAL | Status: DC
  Administered 2013-10-04 – 2013-10-11 (×8): 81 mg via ORAL
  Filled 2013-10-04 (×8): qty 1

## 2013-10-04 MED ORDER — DEXTROSE 5 % IV SOLN
500.0000 mg | INTRAVENOUS | Status: DC
  Administered 2013-10-05 – 2013-10-06 (×2): 500 mg via INTRAVENOUS
  Filled 2013-10-04 (×3): qty 500

## 2013-10-04 MED ORDER — ACETAMINOPHEN 650 MG RE SUPP: 650.0000 mg | Freq: Four times a day (QID) | RECTAL | Status: DC | PRN

## 2013-10-04 MED ORDER — VANCOMYCIN HCL 10 G IV SOLR: 1500.0000 mg | Freq: Every day | INTRAVENOUS | Status: DC

## 2013-10-04 MED ORDER — BUPROPION HCL ER (XL) 150 MG PO TB24
150.0000 mg | ORAL_TABLET | Freq: Every day | ORAL | Status: DC
  Administered 2013-10-05 – 2013-10-11 (×7): 150 mg via ORAL
  Filled 2013-10-04 (×7): qty 1

## 2013-10-04 MED ORDER — DEXTROSE 5 % IV SOLN
1.0000 g | INTRAVENOUS | Status: DC
Start: 2013-10-05 — End: 2013-10-06
  Administered 2013-10-05: 1 g via INTRAVENOUS
  Filled 2013-10-04 (×2): qty 10

## 2013-10-04 MED ORDER — ALBUTEROL SULFATE (2.5 MG/3ML) 0.083% IN NEBU
2.5000 mg | INHALATION_SOLUTION | RESPIRATORY_TRACT | Status: DC | PRN
  Administered 2013-10-06: 2.5 mg via RESPIRATORY_TRACT
  Filled 2013-10-04 (×2): qty 3

## 2013-10-04 MED ORDER — PANTOPRAZOLE SODIUM 40 MG IV SOLR
40.0000 mg | INTRAVENOUS | Status: DC
  Administered 2013-10-04 – 2013-10-06 (×3): 40 mg via INTRAVENOUS
  Filled 2013-10-04 (×4): qty 40

## 2013-10-04 MED ORDER — SODIUM CHLORIDE 0.9 % IV BOLUS (SEPSIS)
1000.0000 mL | INTRAVENOUS | Status: AC
  Administered 2013-10-04: 1000 mL via INTRAVENOUS

## 2013-10-04 MED ORDER — SENNA 8.6 MG PO TABS
1.0000 | ORAL_TABLET | Freq: Every day | ORAL | Status: DC
  Administered 2013-10-05 – 2013-10-11 (×5): 8.6 mg via ORAL
  Filled 2013-10-04 (×5): qty 1

## 2013-10-04 MED ORDER — ONDANSETRON HCL 4 MG/2ML IJ SOLN
4.0000 mg | Freq: Four times a day (QID) | INTRAMUSCULAR | Status: DC | PRN
  Administered 2013-10-05 – 2013-10-09 (×5): 4 mg via INTRAVENOUS
  Filled 2013-10-04 (×6): qty 2

## 2013-10-04 MED ORDER — DEXTROSE 5 % IV SOLN
1.0000 g | Freq: Once | INTRAVENOUS | Status: AC
  Administered 2013-10-04: 1 g via INTRAVENOUS
  Filled 2013-10-04: qty 10

## 2013-10-04 MED ORDER — MORPHINE SULFATE 2 MG/ML IJ SOLN
2.0000 mg | INTRAMUSCULAR | Status: DC | PRN
  Administered 2013-10-05 – 2013-10-06 (×4): 2 mg via INTRAVENOUS
  Filled 2013-10-04 (×4): qty 1

## 2013-10-04 NOTE — ED Notes (Signed)
Pt reports fever, cough and sore throat that began on Friday - pt was seen at [facility] yesterday for symptoms and dx w/ possible influenza although flu swab was negative - pt has continued to have fever and x1 episode of n/v - denies diarrhea. Pt also states that while she was being seen at [facility] her BP was low approx 80/30 and was made to drink fluids while there - pt admits to dizziness - pt tolerates PO intake at home.

## 2013-10-04 NOTE — ED Notes (Signed)
Pt also admits to left chest pain that began a few hours ago - pain described as pressure. Last dose of ibuprofen for fever was 12:00pm and no acetaminophen taken today.

## 2013-10-04 NOTE — ED Provider Notes (Signed)
CSN: [id number]     Arrival date & time 10/04/13  1402 History   First MD Initiated Contact with Patient 10/04/13 1438     Chief Complaint  Patient presents with  . Fever  . Chest Pain     (Consider location/radiation/quality/duration/timing/severity/associated sxs/prior Treatment) HPI Comments: Patient presents with a two-day history of fever, cough, body aches, sore throat. She was seen at urgent care yesterday and told she had influenza and bronchitis. She was given Tamiflu. She returns today with worsening weakness, body aches, chills and fever at home to 105. She has had one episode of vomiting. No diarrhea. 2 hours prior to arrival here she developed some central chest pain that is constant. She has a history of one stent. She endorses feeling lightheaded and dizzy. She's not been vomiting at home other than that one time.. No diarrhea. No sick contacts. No recent travel. No recent antibiotic use. She has a indwelling morphine and baclofen pump for chronic back pain.  The history is provided by the patient.    Past Medical History  Diagnosis Date  . Degenerative disc disease   . Spinal stenosis   . Facet joint disease   . Bulging disc   . MRSA (methicillin resistant staph aureus) culture positive 2011    GREAT TOE RIGHT FOOT  . Fibromyalgia   . Peripheral neuropathy   . MCL deficiency, knee   . Coronary arteritis    Past Surgical History  Procedure Laterality Date  . Foot surgery Right     BIG TOE  . Back surgery      X 3  . Hand surgery Right   . Gallbladder surgery    . Ablation      UTERUS  . Ablation      HEART  . Heart stent    . Hand surgey Left   . Foot surgery Bilateral     PLANTAR FASCIITIS  . Foot surgery Right     2ND TOE  . Infusion pump implantation      X2  . Eye surgery Bilateral    History reviewed. No pertinent family history. History  Substance Use Topics  . Smoking status: Former Research scientist (life sciences)  . Smokeless tobacco: Not on file  . Alcohol Use:  No   OB History   Grav Para Term Preterm Abortions TAB SAB Ect Mult Living                 Review of Systems  Constitutional: Positive for fever, activity change and appetite change.  HENT: Positive for congestion, rhinorrhea and sore throat.   Respiratory: Positive for cough. Negative for chest tightness and shortness of breath.   Cardiovascular: Positive for chest pain.  Gastrointestinal: Negative for nausea, vomiting and abdominal pain.  Genitourinary: Negative for vaginal bleeding and vaginal discharge.  Musculoskeletal: Positive for arthralgias and myalgias. Negative for back pain, neck pain and neck stiffness.  Skin: Negative for rash.  Neurological: Positive for weakness and headaches. Negative for dizziness.  A complete 10 system review of systems was obtained and all systems are negative except as noted in the HPI and PMH.      Allergies  Review of patient's allergies indicates no known allergies.  Home Medications   Current Outpatient Rx  Name  Route  Sig  Dispense  Refill  . aspirin 81 MG tablet   Oral   Take 81 mg by mouth daily.           Marland Kitchen BACLOFEN PO  Oral   Take 65.87 mcg by mouth daily. Continuous infusion with morphine pump.         Marland Kitchen buPROPion (WELLBUTRIN XL) 150 MG 24 hr tablet   Oral   Take 150 mg by mouth daily.         . isosorbide dinitrate (ISORDIL) 30 MG tablet   Oral   Take 30 mg by mouth daily.         . Multiple Vitamin (MULTIVITAMIN WITH MINERALS) TABS tablet   Oral   Take 1 tablet by mouth daily.         . NON FORMULARY   Intravenous (Continuous Infusion)   8.587 mg by Intravenous (Continuous Infusion) route daily. MORPHINE PUMP         . oseltamivir (TAMIFLU) 75 MG capsule   Oral   Take 75 mg by mouth 2 (two) times daily. For 5 days.         Marland Kitchen oxyCODONE (OXY IR/ROXICODONE) 5 MG immediate release tablet   Oral   Take 5 mg by mouth 3 (three) times daily as needed for severe pain (1 to 3 times daily as needed for  pain.).         Marland Kitchen PARoxetine (PAXIL) 20 MG tablet   Oral   Take 10 mg by mouth daily. Take 0.5 tablet by mouth twice daily         . pregabalin (LYRICA) 75 MG capsule   Oral   Take 75 mg by mouth daily.          . rosuvastatin (CRESTOR) 20 MG tablet   Oral   Take 1 tablet (20 mg total) by mouth daily.   30 tablet   5   . tiZANidine (ZANAFLEX) 4 MG tablet   Oral   Take 4 mg by mouth 3 (three) times daily with meals as needed for muscle spasms (2 to 3 times daily.).          BP 101/51  Pulse 85  Temp(Src) 100.7 F (38.2 C) (Oral)  Resp 23  Ht 5\' 10"  (1.778 m)  Wt 232 lb (105.235 kg)  BMI 33.29 kg/m2  SpO2 92% Physical Exam  Constitutional: She is oriented to person, place, and time. She appears well-developed and well-nourished.  Ill appearing  HENT:  Head: Normocephalic and atraumatic.  Mouth/Throat: No oropharyngeal exudate.  Dry MM  Eyes: Conjunctivae and EOM are normal.  Neck: Normal range of motion.  No meningismus  Cardiovascular: Normal rate, regular rhythm and normal heart sounds.   No murmur heard. Pulmonary/Chest: Effort normal and breath sounds normal. No respiratory distress.  Abdominal: Soft. There is no tenderness. There is no rebound and no guarding.  RLQ pain pump without overlying skin change  Musculoskeletal: Normal range of motion. She exhibits no edema and no tenderness.  Neurological: She is alert and oriented to person, place, and time. No cranial nerve deficit. She exhibits normal muscle tone. Coordination normal.  Skin: Skin is warm.    ED Course  Procedures (including critical care time) Labs Review Labs Reviewed  CBC - Abnormal; Notable for the following:    RBC 3.86 (*)    Hemoglobin 11.2 (*)    HCT 33.1 (*)    Platelets 85 (*)    All other components within normal limits  BASIC METABOLIC PANEL - Abnormal; Notable for the following:    Sodium 136 (*)    Glucose, Bld 162 (*)    BUN 26 (*)    Creatinine, Ser 1.44 (*)  Calcium 8.3 (*)    GFR calc non Af Amer 40 (*)    GFR calc Af Amer 46 (*)    All other components within normal limits  HEPATIC FUNCTION PANEL - Abnormal; Notable for the following:    Albumin 3.1 (*)    AST 39 (*)    All other components within normal limits  I-STAT CG4 LACTIC ACID, ED - Abnormal; Notable for the following:    Lactic Acid, Venous 2.62 (*)    All other components within normal limits  RAPID STREP SCREEN  CULTURE, BLOOD (ROUTINE X 2)  CULTURE, BLOOD (ROUTINE X 2)  URINE CULTURE  CULTURE, GROUP A STREP  LIPASE, BLOOD  TROPONIN I  URINALYSIS, ROUTINE W REFLEX MICROSCOPIC  BLOOD GAS, ARTERIAL  I-STAT TROPOININ, ED   Imaging Review Dg Chest 2 View  10/04/2013   CLINICAL DATA:  Chest pain  EXAM: CHEST  2 VIEW  COMPARISON:  08/28/2012  FINDINGS: Cardiomediastinal silhouette is stable. No acute infiltrate or pleural effusion. No pulmonary edema. Mild elevation of the right hemidiaphragm.  IMPRESSION: No active cardiopulmonary disease.   Electronically Signed   By: Lahoma Crocker M.D.   On: 10/04/2013 14:46     EKG Interpretation   Date/Time:  Sunday October 04 2013 14:35:01 EDT Ventricular Rate:  87 PR Interval:  135 QRS Duration: 91 QT Interval:  337 QTC Calculation: 405 R Axis:   45 Text Interpretation:  Sinus rhythm Borderline T abnormalities, anterior  leads Nonspecific ST abnormality Confirmed by [place] [id number])  on 10/04/2013 2:56:01 PM      MDM   Final diagnoses:  None   Body aches, congestion, sore throat and cough. On arrival patient is febrile, hypertensive and borderline hypoxic. She is in no distress. She was told at urgent care that she had the flu. She was prescribed Tamiflu.  On arrival patient was given IV fluids, blood work obtained, cultures obtained. Suspicion for sepsis of apparent respiratory source. Code sepsis called.  However her chest x-ray appears to be clear. He started IV fluid resuscitation with improvement in blood  pressure [id number] systolic. Lactate 2.6. Creatinine elevated at 1.4. New thrombocytopenia.  With fever, cough, body aches, borderline hypoxia we'll treat for pneumonia with Rocephin and azithromycin. EKG is unchanged. Troponin is negative.  Patient will need admission for sepsis.  Care transferred to Dr. Aline Brochure at shift change.  UA pending.  PE considered but seems less likely given infectious symptoms.  HR 80s, BP 90s/50s.  Second bolus infusing.   CRITICAL CARE Performed by: Ezequiel Essex Total critical care time: 30 Critical care time was exclusive of separately billable procedures and treating other patients. Critical care was necessary to treat or prevent imminent or life-threatening deterioration. Critical care was time spent personally by me on the following activities: development of treatment plan with patient and/or surrogate as well as nursing, discussions with consultants, evaluation of patient's response to treatment, examination of patient, obtaining history from patient or surrogate, ordering and performing treatments and interventions, ordering and review of laboratory studies, ordering and review of radiographic studies, pulse oximetry and re-evaluation of patient's condition.      Ezequiel Essex, MD 10/04/13 [phone]

## 2013-10-04 NOTE — ED Provider Notes (Signed)
4:15 PM Accepted care from Dr. Wyvonnia Dusky. 70F here w/ sepsis, presumed respiratory source, awaiting UA. Pt hypotensive, on 2nd L IVF, remains stable.   The patient will be admitted to stepdown by the hospitalist. Any medications given in the ED during this visit are listed below:  Medications  sodium chloride 0.9 % bolus 1,000 mL (not administered)  acetaminophen (TYLENOL) tablet 650 mg (650 mg Oral Given 10/04/13 1444)  sodium chloride 0.9 % bolus 1,000 mL (0 mLs Intravenous Stopped 10/04/13 1523)  cefTRIAXone (ROCEPHIN) 1 g in dextrose 5 % 50 mL IVPB (0 g Intravenous Stopped 10/04/13 1621)  azithromycin (ZITHROMAX) 500 mg in dextrose 5 % 250 mL IVPB (0 mg Intravenous Stopped 10/04/13 1834)  sodium chloride 0.9 % bolus 1,000 mL (0 mLs Intravenous Stopped 10/04/13 1712)  sodium chloride 0.9 % bolus 1,000 mL (0 mLs Intravenous Stopped 10/04/13 1845)    Clinical Impression 1. Sepsis   2. Cough   3. Chest pain   4. Acute renal failure   5. URI (upper respiratory infection)   6. UTI (urinary tract infection)      Blanchard Kelch, MD 10/05/13 1126

## 2013-10-04 NOTE — H&P (Signed)
[facility] History and Physical  Melinda [name] [id number] DOB: December 21, 1955 DOA: 10/04/2013  Referring physician: ED physician, Dr. Aline Brochure PCP: Vedia Coffer, PA-C  Cardiologist: Dr. Marlou Porch Pain physician( in [place]) [place]  Chief Complaint: Persistent fever and chills, sore throat, nonproductive cough; review of systems positive for nausea and vomiting and left-sided chest pain.  HPI: Melinda [name] is a 58 y.o. [name] with a history of coronary artery disease, status post stenting to the LAD in 2009, chronic pain syndrome with insertion of a pain pump with morphine and baclofen, fibromyalgia, and MRSA infection of the right great toe in 2011, who presents with several complaints, but the primary complaint is persistent fever and chills. Proximally one week ago, the patient went to a wrestling event out of town. She was exposed to thousands of people none of which she knew was sick or not. When she got back home, she started feeling bad. Her symptoms started with sore throat and a nonproductive cough. She got better, but then she began to develop a fever 2 days ago of 105F. She has had intermittent chills, body aches, nonproductive cough, and intermittent left-sided chest pain. She denies shortness of breath or pleurisy or wheezing. She denies swelling in her legs. She has had a couple of episodes of nausea and vomiting, but she denies abdominal pain. She denies pain with urination. She denies diarrhea, black tarry stools, or bright red blood per rectum. She presented to the urgent care yesterday. When she was evaluated, her fever was 103F and her blood pressure was in the [id number] systolically. Apparently, they did some testing of influenza, but it was negative. Nevertheless, she was told that she probably had influenza and bronchitis. She was given Tamiflu and sent home. She took 3 tablets of Tamiflu so far. She also took Advil to help with body aches and fever.  In the  emergency department, she was initially hypotensive with a blood pressure 77/46. Following 3 L of normal saline, her blood pressure is 97/58. Her temperature was initially 102.4, but is now 98.4 following Tylenol. Her lab data are significant for normal WBC, hemoglobin of 11.1, platelet count of 85, sodium of 136, BUN of 26, creatinine 1.44, and glucose of 162. Her chest x-ray reveals no acute disease. Her EKG reveals normal sinus rhythm with a heart rate of 87 beats per minute and nonspecific T-wave changes. Her urinalysis reveals small leukocytes and rare bacteria; color is cloudy. She is being admitted for further evaluation and management.    Review of Systems:  Review of systems as above in history present illness. In addition, she has chronic back pain, chronic pain in her knees, pins and needle sensation in her feet and legs. Otherwise review of systems is negative.   Past Medical History  Diagnosis Date  . Degenerative disc disease   . Spinal stenosis   . Facet joint disease   . Bulging disc   . MRSA (methicillin resistant staph aureus) culture positive 2011    GREAT TOE RIGHT FOOT  . Fibromyalgia   . Peripheral neuropathy   . MCL deficiency, knee   . Coronary arteritis   . CAD (coronary artery disease) 2009    s/p stent to LAD   Past Surgical History  Procedure Laterality Date  . Foot surgery Right     BIG TOE  . Back surgery      X 3  . Hand surgery Right   . Gallbladder surgery    . Ablation  UTERUS  . Ablation      HEART  . Heart stent  2009    LAD  . Hand surgey Left   . Foot surgery Bilateral     PLANTAR FASCIITIS  . Foot surgery Right     2ND TOE  . Infusion pump implantation      X2 with morphine and baclofen  . Eye surgery Bilateral    Social History: she lives in [place] with her partner of 15 years, Ms. Hardin Negus. She has no children. She receives disability. She denies tobacco (she quit smoking in 2008); she denies alcohol and illicit drug use.  She still drives.  No Known Allergies  Family history: Her father is alive and has diabetes, coronary artery disease, and osteoarthritis. Her mother died of lung cancer.   Prior to Admission medications   Medication Sig Start Date End Date Taking? Authorizing Provider  aspirin 81 MG tablet Take 81 mg by mouth daily.     Yes Historical Provider, MD  BACLOFEN PO Take 65.87 mcg by mouth daily. Continuous infusion with morphine pump.   Yes Historical Provider, MD  buPROPion (WELLBUTRIN XL) 150 MG 24 hr tablet Take 150 mg by mouth daily.   Yes Historical Provider, MD  isosorbide dinitrate (ISORDIL) 30 MG tablet Take 30 mg by mouth daily.   Yes Historical Provider, MD  Multiple Vitamin (MULTIVITAMIN WITH MINERALS) TABS tablet Take 1 tablet by mouth daily.   Yes Historical Provider, MD  NON FORMULARY 8.587 mg by Intravenous (Continuous Infusion) route daily. MORPHINE PUMP   Yes Historical Provider, MD  oseltamivir (TAMIFLU) 75 MG capsule Take 75 mg by mouth 2 (two) times daily. For 5 days.   Yes Historical Provider, MD  oxyCODONE (OXY IR/ROXICODONE) 5 MG immediate release tablet Take 5 mg by mouth 3 (three) times daily as needed for severe pain (1 to 3 times daily as needed for pain.).   Yes Historical Provider, MD  PARoxetine (PAXIL) 20 MG tablet Take 10 mg by mouth daily. Take 0.5 tablet by mouth twice daily   Yes Historical Provider, MD  pregabalin (LYRICA) 75 MG capsule Take 75 mg by mouth daily.    Yes Historical Provider, MD  rosuvastatin (CRESTOR) 20 MG tablet Take 1 tablet (20 mg total) by mouth daily. 04/07/13  Yes Candee Furbish, MD  tiZANidine (ZANAFLEX) 4 MG tablet Take 4 mg by mouth 3 (three) times daily with meals as needed for muscle spasms (2 to 3 times daily.).   Yes Historical Provider, MD   Physical Exam: Filed Vitals:   10/04/13 1915  BP: 97/58  Pulse: 70  Temp:   Resp: 15    BP 97/58  Pulse 70  Temp(Src) 98.4 F (36.9 C) (Oral)  Resp 15  Ht 5\' 10"  (1.778 m)  Wt 105.235  kg (232 lb)  BMI 33.29 kg/m2  SpO2 99%  General:  pleasant alert obese 58 year old Caucasian woman who is in no acute distress, but she does appear ill and diaphoretic.  Eyes: PERRL, normal lids, irises & conjunctiva ENT: oropharynx mucous membranes are dry, no exudates or posterior erythema. Her tympanic membranes are clear bilaterally. Nasal mucosa without drainage or rhinorrhea.  Neck: no LAD, masses or thyromegaly Cardiovascular: RRR, no m/r/g. No LE edema. Telemetry: SR, no arrhythmias  Respiratory: CTA bilaterally, no w/r/r. Normal respiratory effort. Abdomen: palpable subcutaneous pop right lower quadrant-no surrounding erythema, fluctuants, or tenderness. Abdomen is obese,  soft, ntnd.  Skin: no rash or induration seen on limited exam Musculoskeletal:  grossly normal tone BUE/BLE Psychiatric: grossly normal mood and affect, speech fluent and appropriate Neurologic: grossly non-focal.          Labs on Admission:  Basic Metabolic Panel:  Recent Labs Lab 10/04/13 1507  NA 136*  K 3.7  CL 98  CO2 24  GLUCOSE 162*  BUN 26*  CREATININE 1.44*  CALCIUM 8.3*   Liver Function Tests:  Recent Labs Lab 10/04/13 1507  AST 39*  ALT 32  ALKPHOS 91  BILITOT 0.6  PROT 6.5  ALBUMIN 3.1*    Recent Labs Lab 10/04/13 1507  LIPASE 11   No results found for this basename: AMMONIA,  in the last 168 hours CBC:  Recent Labs Lab 10/04/13 1507  WBC 5.4  HGB 11.2*  HCT 33.1*  MCV 85.8  PLT 85*   Cardiac Enzymes:  Recent Labs Lab 10/04/13 1507  TROPONINI <0.30    BNP (last 3 results) No results found for this basename: PROBNP,  in the last 8760 hours CBG: No results found for this basename: GLUCAP,  in the last 168 hours  Radiological Exams on Admission: Dg Chest 2 View  10/04/2013   CLINICAL DATA:  Chest pain  EXAM: CHEST  2 VIEW  COMPARISON:  08/28/2012  FINDINGS: Cardiomediastinal silhouette is stable. No acute infiltrate or pleural effusion. No pulmonary  edema. Mild elevation of the right hemidiaphragm.  IMPRESSION: No active cardiopulmonary disease.   Electronically Signed   By: Lahoma Crocker M.D.   On: 10/04/2013 14:46    EKG: Independently reviewed. NSR ; non-specific T wave abnormalities; heart rate 87 beats per minute.  Assessment/Plan Principal Problem:   Sepsis Active Problems:   Hypotension   URI (upper respiratory infection)   UTI (urinary tract infection)   Acute renal failure   Nausea and vomiting   Chest pain   CAD (coronary artery disease)   Chronic pain syndrome   Thrombocytopenia   Anemia   1. Sepsis with lactic acidosis. Clear etiology is unknown at this time, but a viral infection, possible pneumonia that has not fluffed out yet, UTI are considerations. The patient received 3 L of normal saline in the ED. She will be continued on supportive treatment and IV fluid hydration. The ED staff was asked to place 2 large-bore IVs. She will be admitted to the ICU for closer monitoring. She received Rocephin and azithromycin in the ED empirically. These 2 medications will be continued. We'll add vancomycin. We'll also continue Tamiflu as tolerated. Blood cultures have been ordered. Urine culture has been ordered. We'll order an influenza panel. We'll order a followup lactic acid level in the morning.  2.  Possible viral syndrome, query influenza/upper respiratory infection. She does not have diarrhea Influenza panel will be ordered. We'll continue Tamiflu empirically for now.  3. Mild pyuria, we'll treat as a urinary tract infection. We'll continue Rocephin as above. Urine culture has been ordered and is pending. 4. Hypotension, likely accommodation of sepsis/possible septic shock and hypovolemia. Her blood pressure has improved with volume resuscitation. 5. Nausea and vomiting. This may be the manifestation of the overall infectious process. Her lipase is within normal limits. She has no abdominal pain. She has a mildly isolated  elevated AST of 39. We'll treat with Zofran IV as needed. We'll add IV Protonix. We'll start a clear liquid diet per request, but will discontinue it if she has further vomiting. 6. Atypical chest pain in a patient with coronary artery disease. Her EKG does not reveal any  significant worrisome findings. Her initial troponin I is negative. We will continue her cardiac medications and order troponin I x3. 7. Acute renal failure. The patient's BUN is 26 and her creatinine is 1.44. She has no history of chronic kidney disease. Acute renal failure is likely prerenal from volume depletion and hypotension. Will continue to follow. 8. Hyperglycemia. The patient has no history of diabetes mellitus. We'll continue to monitor and order hemoglobin A1c. 9. Anemia. We'll order an anemia panel for further evaluation. 10. Thrombocytopenia. Etiology unknown, but could be secondary to the infectious process. We'll order TSH and vitamin B12 as part of the anemia panel for further evaluation.  11. Chronic pain syndrome: Spinal stenosis, degenerative joint disease, peripheral neuropathy, and fibromyalgia. We'll continue the pain pump with morphine and baclofen. Her pain physician is in [place], Dr. Dossie Arbour    Code Status: Full code  Family Communication: discuss with her partner, Ms. Phillips Disposition Plan: anticipate discharge to home in 2-4 days.   Time spent: Critical care time: One hour and 10 minutes.   [facility] Pager [phone]

## 2013-10-04 NOTE — Progress Notes (Signed)
ANTIBIOTIC CONSULT NOTE - INITIAL  Pharmacy Consult for Vancomycin Indication: rule out sepsis  No Known Allergies  Patient Measurements: Height: 5\' 10"  (177.8 cm) Weight: 232 lb (105.235 kg) IBW/kg (Calculated) : 68.5 Adjusted Body Weight:   Vital Signs: Temp: 98.5 F (36.9 C) (04/05 2052) Temp src: Oral (04/05 2052) BP: 113/51 mmHg (04/05 2052) Pulse Rate: 65 (04/05 2052) Intake/Output from previous day:   Intake/Output from this shift: Total I/O In: -  Out: 700 [Urine:700]  Labs:  Recent Labs  10/04/13 1507  WBC 5.4  HGB 11.2*  PLT 85*  CREATININE 1.44*   Estimated Creatinine Clearance: 57.3 ml/min (by C-G formula based on Cr of 1.44). No results found for this basename: VANCOTROUGH, Corlis Leak, VANCORANDOM, [place], GENTPEAK, GENTRANDOM, TOBRATROUGH, TOBRAPEAK, TOBRARND, AMIKACINPEAK, AMIKACINTROU, AMIKACIN,  in the last 72 hours   Microbiology: Recent Results (from the past 720 hour(s))  RAPID STREP SCREEN     Status: None   Collection Time    10/04/13  3:23 PM      Result Value Ref Range Status   Streptococcus, Group A Screen (Direct) NEGATIVE  NEGATIVE Final   Comment: (NOTE)     A Rapid Antigen test may result negative if the antigen level in the     sample is below the detection level of this test. The FDA has not     cleared this test as a stand-alone test therefore the rapid antigen     negative result has reflexed to a Group A Strep culture.    Medical History: Past Medical History  Diagnosis Date  . Degenerative disc disease   . Spinal stenosis   . Facet joint disease   . Bulging disc   . MRSA (methicillin resistant staph aureus) culture positive 2011    GREAT TOE RIGHT FOOT  . Fibromyalgia   . Peripheral neuropathy   . MCL deficiency, knee   . Coronary arteritis   . CAD (coronary artery disease) 2009    s/p stent to LAD    Medications:  Anti-infectives   Start     Dose/Rate Route Frequency Ordered Stop   10/05/13 2200   vancomycin (VANCOCIN) 1,500 mg in sodium chloride 0.9 % 500 mL IVPB     1,500 mg 250 mL/hr over 120 Minutes Intravenous Daily at bedtime 10/04/13 2333     10/05/13 1500  azithromycin (ZITHROMAX) 500 mg in dextrose 5 % 250 mL IVPB     500 mg 250 mL/hr over 60 Minutes Intravenous Every 24 hours 10/04/13 1937     10/05/13 1500  cefTRIAXone (ROCEPHIN) 1 g in dextrose 5 % 50 mL IVPB     1 g 100 mL/hr over 30 Minutes Intravenous Every 24 hours 10/04/13 1937     10/04/13 2200  oseltamivir (TAMIFLU) capsule 75 mg     75 mg Oral 2 times daily 10/04/13 1936 10/09/13 2159   10/04/13 1945  vancomycin (VANCOCIN) 2,000 mg in sodium chloride 0.9 % 500 mL IVPB     2,000 mg 250 mL/hr over 120 Minutes Intravenous  Once 10/04/13 1941 10/04/13 2316   10/04/13 1530  cefTRIAXone (ROCEPHIN) 1 g in dextrose 5 % 50 mL IVPB     1 g 100 mL/hr over 30 Minutes Intravenous  Once 10/04/13 1520 10/04/13 1621   10/04/13 1530  azithromycin (ZITHROMAX) 500 mg in dextrose 5 % 250 mL IVPB     500 mg 250 mL/hr over 60 Minutes Intravenous  Once 10/04/13 1520 10/04/13 1834  Assessment: Patient with sepsis.  First dose of antibiotics already given.  Goal of Therapy:  Vancomycin trough level 15-20 mcg/ml  Plan:  Measure antibiotic drug levels at steady state Follow up culture results Vancomycin 1500mg  iv q24hr  Nani Skillern Crowford 10/04/2013,11:37 PM

## 2013-10-05 ENCOUNTER — Encounter (HOSPITAL_COMMUNITY): Payer: Self-pay | Admitting: *Deleted

## 2013-10-05 LAB — URINE CULTURE
Colony Count: NO GROWTH
Culture: NO GROWTH

## 2013-10-05 LAB — RETICULOCYTES
RBC.: 3.47 MIL/uL — ABNORMAL LOW (ref 3.87–5.11)
Retic Count, Absolute: 41.6 10*3/uL (ref 19.0–186.0)
Retic Ct Pct: 1.2 % (ref 0.4–3.1)

## 2013-10-05 LAB — DIC (DISSEMINATED INTRAVASCULAR COAGULATION)PANEL
D-Dimer, Quant: 3.85 ug/mL-FEU — ABNORMAL HIGH (ref 0.00–0.48)
Fibrinogen: 459 mg/dL (ref 204–475)
INR: 1.2 (ref 0.00–1.49)
Platelets: 81 10*3/uL — ABNORMAL LOW (ref 150–400)
Prothrombin Time: 14.9 seconds (ref 11.6–15.2)
aPTT: 38 seconds — ABNORMAL HIGH (ref 24–37)

## 2013-10-05 LAB — DIC (DISSEMINATED INTRAVASCULAR COAGULATION) PANEL: Smear Review: NONE SEEN

## 2013-10-05 LAB — IRON AND TIBC
Iron: <10 ug/dL — ABNORMAL LOW (ref 42–135)
UIBC: 212 ug/dL (ref 125–400)

## 2013-10-05 LAB — TROPONIN I
Troponin I: <0.3 ng/mL (ref <0.30)
Troponin I: 0.62 ng/mL (ref <0.30)
Troponin I: <0.3 ng/mL (ref <0.30)
Troponin I: 0.34 ng/mL (ref <0.30)

## 2013-10-05 LAB — HEMOGLOBIN A1C
Hgb A1c MFr Bld: 6.5 % — ABNORMAL HIGH (ref <5.7)
Mean Plasma Glucose: 140 mg/dL — ABNORMAL HIGH (ref <117)

## 2013-10-05 LAB — CBC
HCT: 32.9 % — ABNORMAL LOW (ref 36.0–46.0)
Hemoglobin: 10.8 g/dL — ABNORMAL LOW (ref 12.0–15.0)
MCH: 28.4 pg (ref 26.0–34.0)
MCHC: 32.8 g/dL (ref 30.0–36.0)
MCV: 86.6 fL (ref 78.0–100.0)
Platelets: 75 10*3/uL — ABNORMAL LOW (ref 150–400)
RBC: 3.8 MIL/uL — ABNORMAL LOW (ref 3.87–5.11)
RDW: 13.9 % (ref 11.5–15.5)
WBC: 5.3 10*3/uL (ref 4.0–10.5)

## 2013-10-05 LAB — COMPREHENSIVE METABOLIC PANEL
ALT: 36 U/L — ABNORMAL HIGH (ref 0–35)
AST: 43 U/L — ABNORMAL HIGH (ref 0–37)
Albumin: 2.9 g/dL — ABNORMAL LOW (ref 3.5–5.2)
Alkaline Phosphatase: 90 U/L (ref 39–117)
BUN: 18 mg/dL (ref 6–23)
CO2: 24 mEq/L (ref 19–32)
Calcium: 7.6 mg/dL — ABNORMAL LOW (ref 8.4–10.5)
Chloride: 103 mEq/L (ref 96–112)
Creatinine, Ser: 1.02 mg/dL (ref 0.50–1.10)
GFR calc Af Amer: 70 mL/min — ABNORMAL LOW (ref >90)
GFR calc non Af Amer: 60 mL/min — ABNORMAL LOW (ref >90)
Glucose, Bld: 122 mg/dL — ABNORMAL HIGH (ref 70–99)
Potassium: 3.9 mEq/L (ref 3.7–5.3)
Sodium: 138 mEq/L (ref 137–147)
Total Bilirubin: 0.5 mg/dL (ref 0.3–1.2)
Total Protein: 6.2 g/dL (ref 6.0–8.3)

## 2013-10-05 LAB — INFLUENZA PANEL BY PCR (TYPE A & B)
H1N1 flu by pcr: NOT DETECTED
Influenza A By PCR: NEGATIVE
Influenza B By PCR: NEGATIVE

## 2013-10-05 LAB — VITAMIN B12: Vitamin B-12: 319 pg/mL (ref 211–911)

## 2013-10-05 LAB — PROTIME-INR
INR: 1.13 (ref 0.00–1.49)
Prothrombin Time: 14.3 seconds (ref 11.6–15.2)

## 2013-10-05 LAB — HAPTOGLOBIN: Haptoglobin: 236 mg/dL — ABNORMAL HIGH (ref 45–215)

## 2013-10-05 LAB — LACTIC ACID, PLASMA: Lactic Acid, Venous: 0.7 mmol/L (ref 0.5–2.2)

## 2013-10-05 LAB — MRSA PCR SCREENING: MRSA by PCR: NEGATIVE

## 2013-10-05 LAB — LACTATE DEHYDROGENASE: LDH: 182 U/L (ref 94–250)

## 2013-10-05 LAB — PROCALCITONIN: Procalcitonin: 20.27 ng/mL

## 2013-10-05 LAB — TSH: TSH: 0.443 u[IU]/mL (ref 0.350–4.500)

## 2013-10-05 LAB — FERRITIN: Ferritin: 379 ng/mL — ABNORMAL HIGH (ref 10–291)

## 2013-10-05 LAB — FOLATE: Folate: >20 ng/mL

## 2013-10-05 MED ORDER — SIMETHICONE 80 MG PO CHEW
80.0000 mg | CHEWABLE_TABLET | Freq: Four times a day (QID) | ORAL | Status: DC | PRN
  Administered 2013-10-05 – 2013-10-07 (×3): 80 mg via ORAL
  Filled 2013-10-05 (×2): qty 1

## 2013-10-05 MED ORDER — VANCOMYCIN HCL IN DEXTROSE 1-5 GM/200ML-% IV SOLN
1000.0000 mg | Freq: Two times a day (BID) | INTRAVENOUS | Status: DC
  Administered 2013-10-05 – 2013-10-06 (×4): 1000 mg via INTRAVENOUS
  Filled 2013-10-05 (×6): qty 200

## 2013-10-05 MED ORDER — OXYCODONE HCL 5 MG PO TABS
5.0000 mg | ORAL_TABLET | Freq: Three times a day (TID) | ORAL | Status: DC | PRN
  Administered 2013-10-05 – 2013-10-09 (×7): 5 mg via ORAL
  Filled 2013-10-05 (×8): qty 1

## 2013-10-05 MED ORDER — IBUPROFEN 600 MG PO TABS
600.0000 mg | ORAL_TABLET | Freq: Four times a day (QID) | ORAL | Status: DC | PRN
  Administered 2013-10-05 – 2013-10-06 (×2): 600 mg via ORAL
  Filled 2013-10-05 (×3): qty 1

## 2013-10-05 NOTE — Progress Notes (Signed)
CARE MANAGEMENT NOTE 10/05/2013  Patient:  Melinda [name], Melinda [name]   Account Number:  [id number]  Date Initiated:  10/05/2013  Documentation initiated by:  Emony Dormer  Subjective/Objective Assessment:   sepsis and failed outpt treatment, hypotension requiring iv fld support.     Action/Plan:   home when stable   Anticipated DC Date:  10/08/2013   Anticipated DC Plan:  HOME/SELF CARE  In-house referral  NA      DC Planning Services  NA      [facility] Choice  NA   Choice offered to / List presented to:  NA   DME arranged  NA      DME agency  NA     [place] arranged  NA      [place] agency  NA   Status of service:  In process, will continue to follow Medicare Important Message given?  NA - LOS <3 / Initial given by admissions (If response is "NO", the following Medicare IM given date fields will be blank) Date Medicare IM given:   Date Additional Medicare IM given:    Discharge Disposition:    Per UR Regulation:  Reviewed for med. necessity/level of care/duration of stay  If discussed at [place] of Stay Meetings, dates discussed:    Comments:  [id number]/Eulalie Speights Eldridge Dace, [place], [place] [phone] Chart Reviewed for discharge and hospital needs. Discharge needs at time of review: None present will follow for needs. Review of patient progress due on [id number].

## 2013-10-05 NOTE — Progress Notes (Signed)
10/05/13 0900  Clinical Encounter Type  Visited With Patient  Visit Type (Advance Directives)  Referral From Patient  Spiritual Encounters  Spiritual Needs (Advance Directives assistance)  Stress Factors  Patient Stress Factors Health changes   Melinda [name] wishes to designate her partner Melinda [name] as her healthcare agent (HCPOA).  Provided Advance Directives paperwork and review of HCPOA and living will items for discernment.  [place] plans to request paging of [place] when Melinda [name] is present and they are ready to complete forms with notary and witnesses.  Please also page if further questions or needs arise.  Thank you.  [place], [place]

## 2013-10-05 NOTE — Progress Notes (Signed)
10/05/13 1200  Clinical Encounter Type  Visited With Patient and family together  Visit Type Follow-up   Found notary and witnesses to help [place] and Jerene Pitch complete her Advance Directives paperwork as requested.  Family is aware of ongoing chaplain availability.  [place], [place]

## 2013-10-05 NOTE — Progress Notes (Signed)
Spoke with [facility] and informed that pt has temp of 103.2 and given tylenol also informed that pt blood culture showed gram + cocci in chains in anerobic x 2. Will continue to monitor.

## 2013-10-05 NOTE — Progress Notes (Signed)
RT placed pt on CPAP per home regimine. Home setting is 14 cmH2O with a 30 min ramp and humidification. Placed pt on 14 min ramp because the hospitals machine on goes to 14. Patient is aware. Patient has 4 Liters of oxygen bleed into the CPAP tubing. Patient is tolerating well. RT will continue to monitor.

## 2013-10-05 NOTE — Progress Notes (Signed)
ANTIBIOTIC CONSULT NOTE - FOLLOW UP  Pharmacy Consult for Vancomycin Indication: rule out sepsis  No Known Allergies  Patient Measurements: Height: 5\' 10"  (177.8 cm) Weight: 239 lb 3.2 oz (108.5 kg) IBW/kg (Calculated) : 68.5  Vital Signs: Temp: 98.2 F (36.8 C) (04/06 0800) Temp src: Oral (04/06 0800) BP: 120/51 mmHg (04/06 0754) Pulse Rate: 67 (04/06 1100) Intake/Output from previous day: 04/05 0701 - 04/06 0700 In: [id number] [P.O.:720; I.V.:1350; IV Piggyback:3750] Out: 1825 [Urine:1825] Intake/Output from this shift: Total I/O In: 200 [P.O.:200] Out: -   Labs:  Recent Labs  10/04/13 1507 10/05/13 0136 10/05/13 0805  WBC 5.4 5.3  --   HGB 11.2* 10.8*  --   PLT 85* 75* 81*  CREATININE 1.44* 1.02  --    Estimated Creatinine Clearance: 82.2 ml/min (by C-G formula based on Cr of 1.02). No results found for this basename: VANCOTROUGH, Corlis Leak, VANCORANDOM, GENTTROUGH, GENTPEAK, GENTRANDOM, TOBRATROUGH, TOBRAPEAK, TOBRARND, AMIKACINPEAK, AMIKACINTROU, AMIKACIN,  in the last 72 hours   Microbiology: Recent Results (from the past 720 hour(s))  CULTURE, BLOOD (ROUTINE X 2)     Status: None   Collection Time    10/04/13  2:45 PM      Result Value Ref Range Status   Specimen Description BLOOD LEFT FOREARM  5 ML IN [facility] BOTTLE   Final   Special Requests NONE   Final   Culture  Setup Time     Final   Value: 10/04/2013 18:35     Performed at [facility]   Culture     Final   Value:        BLOOD CULTURE RECEIVED NO GROWTH TO DATE CULTURE WILL BE HELD FOR 5 DAYS BEFORE ISSUING A FINAL NEGATIVE REPORT     Performed at [facility]   Report Status PENDING   Incomplete  CULTURE, BLOOD (ROUTINE X 2)     Status: None   Collection Time    10/04/13  3:07 PM      Result Value Ref Range Status   Specimen Description BLOOD RIGHT ARM  5 ML IN [facility] BOTTLE   Final   Special Requests NONE   Final   Culture  Setup Time     Final   Value: 10/04/2013 20:08      Performed at [facility]   Culture     Final   Value:        BLOOD CULTURE RECEIVED NO GROWTH TO DATE CULTURE WILL BE HELD FOR 5 DAYS BEFORE ISSUING A FINAL NEGATIVE REPORT     Performed at [facility]   Report Status PENDING   Incomplete  RAPID STREP SCREEN     Status: None   Collection Time    10/04/13  3:23 PM      Result Value Ref Range Status   Streptococcus, Group A Screen (Direct) NEGATIVE  NEGATIVE Final   Comment: (NOTE)     A Rapid Antigen test may result negative if the antigen level in the     sample is below the detection level of this test. The FDA has not     cleared this test as a stand-alone test therefore the rapid antigen     negative result has reflexed to a Group A Strep culture.  MRSA PCR SCREENING     Status: None   Collection Time    10/04/13  8:53 PM      Result Value Ref Range Status   MRSA by PCR  NEGATIVE  NEGATIVE Final   Comment:            The GeneXpert MRSA Assay (FDA     approved for NASAL specimens     only), is one component of a     comprehensive MRSA colonization     surveillance program. It is not     intended to diagnose MRSA     infection nor to guide or     monitor treatment for     MRSA infections.    Anti-infectives   Start     Dose/Rate Route Frequency Ordered Stop   10/05/13 2200  vancomycin (VANCOCIN) 1,500 mg in sodium chloride 0.9 % 500 mL IVPB  Status:  Discontinued     1,500 mg 250 mL/hr over 120 Minutes Intravenous Daily at bedtime 10/04/13 2333 10/05/13 0931   10/05/13 1500  azithromycin (ZITHROMAX) 500 mg in dextrose 5 % 250 mL IVPB     500 mg 250 mL/hr over 60 Minutes Intravenous Every 24 hours 10/04/13 1937     10/05/13 1500  cefTRIAXone (ROCEPHIN) 1 g in dextrose 5 % 50 mL IVPB     1 g 100 mL/hr over 30 Minutes Intravenous Every 24 hours 10/04/13 1937     10/05/13 1200  vancomycin (VANCOCIN) IVPB 1000 mg/200 mL premix     1,000 mg 200 mL/hr over 60 Minutes Intravenous Every 12 hours 10/05/13 0931      10/04/13 2200  oseltamivir (TAMIFLU) capsule 75 mg     75 mg Oral 2 times daily 10/04/13 1936 10/09/13 2159   10/04/13 1945  vancomycin (VANCOCIN) 2,000 mg in sodium chloride 0.9 % 500 mL IVPB     2,000 mg 250 mL/hr over 120 Minutes Intravenous  Once 10/04/13 1941 10/04/13 2316   10/04/13 1530  cefTRIAXone (ROCEPHIN) 1 g in dextrose 5 % 50 mL IVPB     1 g 100 mL/hr over 30 Minutes Intravenous  Once 10/04/13 1520 10/04/13 1621   10/04/13 1530  azithromycin (ZITHROMAX) 500 mg in dextrose 5 % 250 mL IVPB     500 mg 250 mL/hr over 60 Minutes Intravenous  Once 10/04/13 1520 10/04/13 1834      Assessment: 84 yoF [facility] ED with worsening fever, chest pain, aches, sore throat.  She was seen at urgent care 4/4 and was given Tamiflu for influenza and bronchitis.  Admitted for IV antibiotics for possible sepsis.  MD started Azithromycin and Ceftriaxone for CAP treatment, Tamiflu for influenza and consulted pharmacy to dose vancomycin given MRSA history.  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  4/5 >> vanc >> 4/5 >> azithromycin >> 4/5 >> ceftriaxone >> 4/5 >> tamiflu >>  Tmax: 102.4 WBCs: wnl Renal: SCr improved to 1.02, CrCl 69N PCT 20.27  4/5 blood: ngtd 4/5 rapid strep screen negative 4/5 group A strep cx: pending 4/5 MRSA screen negative 4/5 urine cx: sent 4/6 resp virus panel: ordered 4/5 influenza panel: pending  Day #2 Vancomycin 2g IV loading dose followed by 1500 mg IV q24h.  Obese dosing.  Weight 108kg.  Goal of Therapy:  Vancomycin trough level 15-20 mcg/ml  Plan:  1.  Due to improvement in SCr, adjust vancomycin to 1g IV q12h. 2.  F/u SCr, trough level, clinical course.  Hershal Coria 10/05/2013,11:34 AM

## 2013-10-05 NOTE — Progress Notes (Signed)
Note: This document was prepared with digital dictation and possible smart phrase technology. Any transcriptional errors that result from this process are unintentional.   Melinda [name] [id number] DOB: [birth year] DOA: 10/04/2013 PCP: Ted Mcalpine  Brief narrative: 58 y/o ? 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  Recently seen [facility] 4/4 for ? FLu-test was neg. blood pressure initially 77/46. Following 3 L of normal saline, her blood pressure is 97/58. Her temperature was initially 102.4, but is now 98.4 following Tylenol. Her lab data are significant for normal WBC, hemoglobin of 11.1, platelet count of 85, sodium of 136, BUN of 26, creatinine 1.44, and glucose of 162. Her chest x-ray reveals no acute disease. Her EKG reveals normal sinus rhythm with a heart rate of 87 beats per minute and nonspecific T-wave changes. Her urinalysis reveals small leukocytes and rare bacteria; color is cloudy.  Past medical history-As per Problem list Chart reviewed as below-   Consultants:  None so far  Procedures:  cxr 2 vw neg  Antibiotics:  Azithro 4/5  Ceftriaxone 4/5  Tamiflu 4/5  Vancomycin 4/5  Blood cult pending  UC pending   Subjective  Feels only slightly better right now No fevers overnight No cp/n/v No diarrhea No rash No swelling RLE or warmth    Objective    Interim History:   Telemetry: NSR   Objective: Filed Vitals:   10/05/13 0200 10/05/13 0330 10/05/13 0400 10/05/13 0600  BP:  145/58    Pulse:  101    Temp: 98.8 F (37.1 C) 103.1 F (39.5 C) 101.2 F (38.4 C) 99.8 F (37.7 C)  TempSrc: Oral Oral  Oral  Resp:  25    Height:      Weight:   108.5 kg (239 lb 3.2 oz)   SpO2:  94%  92%    Intake/Output Summary (Last 24 hours) at 10/05/13 0725 Last data filed at  10/05/13 0600  Gross per 24 hour  Intake   5820 ml  Output   1825 ml  Net   3995 ml    Exam:  General: eomi, ncat Cardiovascular:  s1 s2 no m/r/g Respiratory: clear, noadded sound, no wheeze Abdomen: soft nt/nd Skin no LLE edema  Data Reviewed: Basic Metabolic Panel:  Recent Labs Lab 10/04/13 1507 10/05/13 0136  NA 136* 138  K 3.7 3.9  CL 98 103  CO2 24 24  GLUCOSE 162* 122*  BUN 26* 18  CREATININE 1.44* 1.02  CALCIUM 8.3* 7.6*   Liver Function Tests:  Recent Labs Lab 10/04/13 1507 10/05/13 0136  AST 39* 43*  ALT 32 36*  ALKPHOS 91 90  BILITOT 0.6 0.5  PROT 6.5 6.2  ALBUMIN 3.1* 2.9*    Recent Labs Lab 10/04/13 1507  LIPASE 11   No results found for this basename: AMMONIA,  in the last 168 hours CBC:  Recent Labs Lab 10/04/13 1507 10/05/13 0136  WBC 5.4 5.3  HGB 11.2* 10.8*  HCT 33.1* 32.9*  MCV 85.8 86.6  PLT 85* 75*   Cardiac Enzymes:  Recent Labs Lab 10/04/13 1507 10/04/13 2000 10/05/13 0136  TROPONINI <0.30 <0.30 <0.30   BNP: No components found with this basename: POCBNP,  CBG: No results found for this basename: GLUCAP,  in the last 168 hours  Recent Results (from the past 240 hour(s))  RAPID  STREP SCREEN     Status: None   Collection Time    10/04/13  3:23 PM      Result Value Ref Range Status   Streptococcus, Group A Screen (Direct) NEGATIVE  NEGATIVE Final   Comment: (NOTE)     A Rapid Antigen test may result negative if the antigen level in the     sample is below the detection level of this test. The FDA has not     cleared this test as a stand-alone test therefore the rapid antigen     negative result has reflexed to a Group A Strep culture.  MRSA PCR SCREENING     Status: None   Collection Time    10/04/13  8:53 PM      Result Value Ref Range Status   MRSA by PCR NEGATIVE  NEGATIVE Final   Comment:            The GeneXpert MRSA Assay (FDA     approved for NASAL specimens     only), is one component of a      comprehensive MRSA colonization     surveillance program. It is not     intended to diagnose MRSA     infection nor to guide or     monitor treatment for     MRSA infections.     Studies:              All Imaging reviewed and is as per above notation   Scheduled Meds: . aspirin EC  81 mg Oral Daily  . atorvastatin  40 mg Oral q1800  . azithromycin  500 mg Intravenous Q24H  . buPROPion  150 mg Oral Daily  . cefTRIAXone (ROCEPHIN)  IV  1 g Intravenous Q24H  . isosorbide dinitrate  30 mg Oral Daily  . oseltamivir  75 mg Oral BID  . pantoprazole (PROTONIX) IV  40 mg Intravenous Q24H  . PARoxetine  10 mg Oral Daily  . pregabalin  75 mg Oral Daily  . senna  1 tablet Oral Daily  . vancomycin  1,500 mg Intravenous QHS   Continuous Infusions: . 0.9 % NaCl with KCl 20 mEq / L 150 mL/hr (10/04/13 2116)     Assessment/Plan: 1. Sepsis with lactic acidosis. unclear etiology is unknown received 3 L of normal saline in the ED. acidosis trending She will be continued on supportive treatment and IV fluid hydration. The ED staff was asked to place 2 large-bore IVs. She will be admitted to the ICU for closer monitoring. Continue antibiotics as above- We'll also continue Tamiflu as tolerated until we know the source. Follow urine and blood cultures-added respiratory viral panel for completion sake- 2. Possible viral syndrome, query influenza/upper respiratory infection. She does not have diarrhea Influenza panel will be ordered. We'll continue Tamiflu empirically for now.  3. Thrombocytopenia + mild coagulopathy-baseline platelet count 200, currently 70s. Hold heparin products. DIC panel ordered-await schistocytes then determine if further work-up needed-could be secondary to viral illness-TSH and vitamin B12 as part of the anemia panel for further evaluation.  4. Mild pyuria, we'll treat as a urinary tract infection. We'll continue Rocephin as above. Urine culture has been ordered and is  pending. 5. Hypotension, likely accommodation of sepsis/possible septic shock and hypovolemia. Her blood pressure has improved with volume resuscitation. 6. Nausea and vomiting- lipase is within normal limits. She has no abdominal pain. She has a mildly isolated elevated AST of 39. We'll treat with Zofran IV  as needed. We'll add IV Protonix. Full diet 7. Atypical chest pain in a patient with coronary artery disease. Her EKG does not reveal any significant worrisome findings. Her initial troponin I is negative. We will continue her cardiac medications and order troponin I x3. Acute renal failure likely prerenal from volume depletion and hypotension. The patient's BUN is 26 and her creatinine is 1.44- ??? 18/1.02 She has no history of chronic kidney disease.  8. Hyperglycemia. The patient has no history of diabetes mellitus. We'll continue to monitor and order hemoglobin A1c. 9. AOCD - Fe less than 10 ferritin elevated 379 10. Chronic pain syndrome: Spinal stenosis, degenerative joint disease, peripheral neuropathy, and fibromyalgia. We'll continue the pain pump with morphine and baclofen. Her pain physician is in [place], Dr. Dossie Arbour     Code Status: Full Family Communication: None bedside Disposition Plan:  SDU for now   Verneita Griffes, MD  [facility] Pager [phone] 10/05/2013, 7:25 AM    LOS: 1 day

## 2013-10-06 ENCOUNTER — Encounter (HOSPITAL_COMMUNITY): Payer: Self-pay | Admitting: Radiology

## 2013-10-06 ENCOUNTER — Inpatient Hospital Stay (HOSPITAL_COMMUNITY): Payer: Medicare Other

## 2013-10-06 LAB — CSF CELL COUNT WITH DIFFERENTIAL
RBC Count, CSF: 1 /mm3 — ABNORMAL HIGH
RBC Count, CSF: 1 /mm3 — ABNORMAL HIGH
Tube #: 1
Tube #: 4
WBC, CSF: 3 /mm3 (ref 0–5)
WBC, CSF: 4 /mm3 (ref 0–5)

## 2013-10-06 LAB — CBC WITH DIFFERENTIAL/PLATELET
Basophils Absolute: 0 10*3/uL (ref 0.0–0.1)
Basophils Relative: 0 % (ref 0–1)
Eosinophils Absolute: 0 10*3/uL (ref 0.0–0.7)
Eosinophils Relative: 0 % (ref 0–5)
HCT: 32.5 % — ABNORMAL LOW (ref 36.0–46.0)
Hemoglobin: 11.2 g/dL — ABNORMAL LOW (ref 12.0–15.0)
Lymphocytes Relative: 14 % (ref 12–46)
Lymphs Abs: 0.8 10*3/uL (ref 0.7–4.0)
MCH: 29 pg (ref 26.0–34.0)
MCHC: 34.5 g/dL (ref 30.0–36.0)
MCV: 84.2 fL (ref 78.0–100.0)
Monocytes Absolute: 0.8 10*3/uL (ref 0.1–1.0)
Monocytes Relative: 13 % — ABNORMAL HIGH (ref 3–12)
Neutro Abs: 4.4 10*3/uL (ref 1.7–7.7)
Neutrophils Relative %: 73 % (ref 43–77)
Platelets: 104 10*3/uL — ABNORMAL LOW (ref 150–400)
RBC: 3.86 MIL/uL — ABNORMAL LOW (ref 3.87–5.11)
RDW: 13.7 % (ref 11.5–15.5)
WBC: 5.9 10*3/uL (ref 4.0–10.5)

## 2013-10-06 LAB — RESPIRATORY VIRUS PANEL
Adenovirus: NOT DETECTED
Influenza A H1: NOT DETECTED
Influenza A H3: NOT DETECTED
Influenza A: NOT DETECTED
Influenza B: NOT DETECTED
Metapneumovirus: NOT DETECTED
Parainfluenza 1: NOT DETECTED
Parainfluenza 2: NOT DETECTED
Parainfluenza 3: NOT DETECTED
Respiratory Syncytial Virus A: NOT DETECTED
Respiratory Syncytial Virus B: NOT DETECTED
Rhinovirus: DETECTED — AB

## 2013-10-06 LAB — GRAM STAIN: Gram Stain: NONE SEEN

## 2013-10-06 LAB — PROTEIN AND GLUCOSE, CSF
Glucose, CSF: 92 mg/dL — ABNORMAL HIGH (ref 43–76)
Total  Protein, CSF: 24 mg/dL (ref 15–45)

## 2013-10-06 LAB — COMPREHENSIVE METABOLIC PANEL
ALT: 39 U/L — ABNORMAL HIGH (ref 0–35)
AST: 39 U/L — ABNORMAL HIGH (ref 0–37)
Albumin: 2.9 g/dL — ABNORMAL LOW (ref 3.5–5.2)
Alkaline Phosphatase: 108 U/L (ref 39–117)
BUN: 8 mg/dL (ref 6–23)
CO2: 26 mEq/L (ref 19–32)
Calcium: 8.4 mg/dL (ref 8.4–10.5)
Chloride: 99 mEq/L (ref 96–112)
Creatinine, Ser: 0.81 mg/dL (ref 0.50–1.10)
GFR calc Af Amer: >90 mL/min (ref >90)
GFR calc non Af Amer: 80 mL/min — ABNORMAL LOW (ref >90)
Glucose, Bld: 160 mg/dL — ABNORMAL HIGH (ref 70–99)
Potassium: 3.4 mEq/L — ABNORMAL LOW (ref 3.7–5.3)
Sodium: 138 mEq/L (ref 137–147)
Total Bilirubin: 0.7 mg/dL (ref 0.3–1.2)
Total Protein: 6.7 g/dL (ref 6.0–8.3)

## 2013-10-06 LAB — CULTURE, GROUP A STREP

## 2013-10-06 LAB — PROCALCITONIN: Procalcitonin: 11.04 ng/mL

## 2013-10-06 MED ORDER — DEXTROSE 5 % IV SOLN
2.0000 g | INTRAVENOUS | Status: DC
  Filled 2013-10-06: qty 2

## 2013-10-06 MED ORDER — DEXTROSE 5 % IV SOLN
2.0000 g | Freq: Two times a day (BID) | INTRAVENOUS | Status: DC
  Administered 2013-10-06 – 2013-10-07 (×2): 2 g via INTRAVENOUS
  Filled 2013-10-06 (×3): qty 2

## 2013-10-06 MED ORDER — MORPHINE SULFATE 4 MG/ML IJ SOLN
4.0000 mg | INTRAMUSCULAR | Status: DC | PRN
  Administered 2013-10-06 – 2013-10-08 (×8): 4 mg via INTRAVENOUS
  Filled 2013-10-06 (×8): qty 1

## 2013-10-06 MED ORDER — DEXTROSE 5 % IV SOLN
10.0000 mg/kg | Freq: Three times a day (TID) | INTRAVENOUS | Status: DC
  Administered 2013-10-06 – 2013-10-07 (×3): 685 mg via INTRAVENOUS
  Filled 2013-10-06 (×4): qty 13.7

## 2013-10-06 NOTE — Progress Notes (Signed)
Approximately 2130 patients left anterior forearm IV began to burn and swell. Patient called out and charge nurse, Benjamin Stain, immediately stopped infusion (acyclovir) Pharmacy was notified as well as on call MD, considering the infusion that was running when the IV infiltrated. Per pharmacy I removed the IV while slowly aspirating with 10cc syringe, (1 cc was aspirated) placed a sterile gauze over site, outlined area of swelling and redness, as well as elevated the extremity. Will continue to monitor site.

## 2013-10-06 NOTE — Progress Notes (Signed)
Note: This document was prepared with digital dictation and possible smart phrase technology. Any transcriptional errors that result from this process are unintentional.   Melinda [name] [id number] DOB: 1955-07-31 DOA: 10/04/2013 PCP: Ted Mcalpine  Brief narrative: 58 y/o ? 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  Recently seen [facility] 4/4 for ? FLu-test was neg. blood pressure initially 77/46. Following 3 L of normal saline, her blood pressure is 97/58. Her temperature was initially 102.4, but is now 98.4 following Tylenol. Her lab data are significant for normal WBC, hemoglobin of 11.1, platelet count of 85, sodium of 136, BUN of 26, creatinine 1.44, and glucose of 162. Her chest x-ray reveals no acute disease. Her EKG reveals normal sinus rhythm with a heart rate of 87 beats per minute and nonspecific T-wave changes. Her urinalysis reveals small leukocytes and rare bacteria; color is cloudy. She developed increasing headache and continued to spike fevers and as such because of unknown source of infection, patient underwent lumbar puncture 4/7  Past medical history-As per Problem list Chart reviewed as below-   Consultants:  None so far  Procedures:  cxr 2 vw neg  Antibiotics:  Azithro 4/5  Ceftriaxone 4/5  Tamiflu 4/5  Vancomycin 4/5  Blood cult pending  UC pending   Subjective   Moderate to severe headache Feels pretty poorly because of this-some tenderness of the cervical spine No nausea no vomiting but does have photophobia Headache Pain not relieved by morphine   Objective    Interim History:   Telemetry: NSR   Objective: Filed Vitals:   10/06/13 1025 10/06/13 1200 10/06/13 1300 10/06/13 1305  BP:    116/61  Pulse: 92   73  Temp: 102.3 F (39.1 C) 98.4 F (36.9 C)  98.4 F (36.9 C)   TempSrc:  Oral    Resp: 21   26  Height:      Weight:      SpO2: 94%   92%    Intake/Output Summary (Last 24 hours) at 10/06/13 1349 Last data filed at 10/06/13 1000  Gross per 24 hour  Intake 2778.84 ml  Output   3875 ml  Net -1096.16 ml    Exam:  General: eomi, ncat Cardiovascular:  s1 s2 no m/r/g Respiratory: clear, noadded sound, no wheeze Abdomen: soft nt/nd Skin no LLE edema Mild tenderness of the upper cervical muscles, Brudzinski and Kernig's test and negative  Data Reviewed: Basic Metabolic Panel:  Recent Labs Lab 10/04/13 1507 10/05/13 0136 10/06/13 0900  NA 136* 138 138  K 3.7 3.9 3.4*  CL 98 103 99  CO2 24 24 26   GLUCOSE 162* 122* 160*  BUN 26* 18 8  CREATININE 1.44* 1.02 0.81  CALCIUM 8.3* 7.6* 8.4   Liver Function Tests:  Recent Labs Lab 10/04/13 1507 10/05/13 0136 10/06/13 0900  AST 39* 43* 39*  ALT 32 36* 39*  ALKPHOS 91 90 108  BILITOT 0.6 0.5 0.7  PROT 6.5 6.2 6.7  ALBUMIN 3.1* 2.9* 2.9*    Recent Labs Lab 10/04/13 1507  LIPASE 11   No results found for this basename: AMMONIA,  in the last 168 hours CBC:  Recent Labs Lab 10/04/13 1507 10/05/13 0136 10/05/13 0805 10/06/13 0900  WBC 5.4 5.3  --  5.9  NEUTROABS  --   --   --  4.4  HGB 11.2* 10.8*  --  11.2*  HCT 33.1* 32.9*  --  32.5*  MCV 85.8 86.6  --  84.2  PLT 85* 75* 81* 104*   Cardiac Enzymes:  Recent Labs Lab 10/04/13 2000 10/05/13 0136 10/05/13 0805 10/05/13 1501 10/05/13 1950  TROPONINI <0.30 <0.30 0.62* <0.30 0.34*   BNP: No components found with this basename: POCBNP,  CBG: No results found for this basename: GLUCAP,  in the last 168 hours  Recent Results (from the past 240 hour(s))  CULTURE, BLOOD (ROUTINE X 2)     Status: None   Collection Time    10/04/13  2:45 PM      Result Value Ref Range Status   Specimen Description BLOOD LEFT FOREARM  5 ML IN [facility] BOTTLE   Final   Special Requests NONE   Final   Culture  Setup  Time     Final   Value: 10/04/2013 18:35     Performed at [facility]   Culture     Final   Value: [place] IN CHAINS     Note: Gram Stain Report Called to,Read Back By and Verified With: TVEDT WOODS ON 10/05/2013 AT 7:18P BY WILEJ     Performed at [facility]   Report Status PENDING   Incomplete  CULTURE, BLOOD (ROUTINE X 2)     Status: None   Collection Time    10/04/13  3:07 PM      Result Value Ref Range Status   Specimen Description BLOOD RIGHT ARM  5 ML IN [facility] BOTTLE   Final   Special Requests NONE   Final   Culture  Setup Time     Final   Value: 10/04/2013 20:08     Performed at [facility]   Culture     Final   Value: GRAM POSITIVE COCCI IN CHAINS     Note: Gram Stain Report Called to,Read Back By and Verified With: TVEDT WOODS ON 10/05/2013 AT 7:18P BY WILEJ     Performed at [facility]   Report Status PENDING   Incomplete  RAPID STREP SCREEN     Status: None   Collection Time    10/04/13  3:23 PM      Result Value Ref Range Status   Streptococcus, Group A Screen (Direct) NEGATIVE  NEGATIVE Final   Comment: (NOTE)     A Rapid Antigen test may result negative if the antigen level in the     sample is below the detection level of this test. The FDA has not     cleared this test as a stand-alone test therefore the rapid antigen     negative result has reflexed to a Group A Strep culture.  CULTURE, GROUP A STREP     Status: None   Collection Time    10/04/13  3:23 PM      Result Value Ref Range Status   Specimen Description THROAT   Final   Special Requests NONE   Final   Culture     Final   Value: NO SUSPICIOUS COLONIES, CONTINUING TO HOLD     Performed at [facility]   Report Status PENDING   Incomplete  URINE CULTURE     Status: None   Collection Time    10/04/13  5:21 PM      Result Value Ref Range Status   Specimen Description URINE, CLEAN CATCH   Final   Special  Requests NONE   Final   Culture  Setup Time      Final   Value: 10/04/2013 21:11     Performed at [place]     Final   Value: NO GROWTH     Performed at [facility]   Culture     Final   Value: NO GROWTH     Performed at [facility]   Report Status 10/05/2013 FINAL   Final  MRSA PCR SCREENING     Status: None   Collection Time    10/04/13  8:53 PM      Result Value Ref Range Status   MRSA by PCR NEGATIVE  NEGATIVE Final   Comment:            The GeneXpert MRSA Assay (FDA     approved for NASAL specimens     only), is one component of a     comprehensive MRSA colonization     surveillance program. It is not     intended to diagnose MRSA     infection nor to guide or     monitor treatment for     MRSA infections.     Studies:              All Imaging reviewed and is as per above notation   Scheduled Meds: . acyclovir  10 mg/kg (Ideal) Intravenous 3 times per day  . aspirin EC  81 mg Oral Daily  . atorvastatin  40 mg Oral q1800  . azithromycin  500 mg Intravenous Q24H  . buPROPion  150 mg Oral Daily  . cefTRIAXone (ROCEPHIN)  IV  2 g Intravenous Q12H  . isosorbide dinitrate  30 mg Oral Daily  . oseltamivir  75 mg Oral BID  . pantoprazole (PROTONIX) IV  40 mg Intravenous Q24H  . PARoxetine  10 mg Oral Daily  . pregabalin  75 mg Oral Daily  . senna  1 tablet Oral Daily  . vancomycin  1,000 mg Intravenous Q12H   Continuous Infusions: . 0.9 % NaCl with KCl 20 mEq / L 20 mL/hr at 10/05/13 2157     Assessment/Plan: 1. Sepsis with lactic acidosis. Gr+ cocci growing in 2/2 bottles blood-received 3 L of normal saline in the ED.  Continue on supportive treatment and IV fluid hydration. Trend pro calcitonin is coming downI have ordered an LP due to neck pain and we will get CSF culture CSF glucose and protein.  Follow urine and blood cultures-added respiratory viral panel for completion sake which is still pending 2. Possible meningitis-see above,Abx changed to meningitis doses,  and acyclovir. If no resolution, w consult ID 3. Possible viral syndrome, query influenza/upper respiratory infection. She does not have diarrhea -discontinue Tamiflu as test was negative 4. Thrombocytopenia + mild coagulopathy-baseline platelet count 200, currently 70s. Hold heparin products. DIC panel ordered-shows only elevated PTT and d-dimer which could be secondary  to viral illness-TSH and vitamin B12 as part of the anemia panel for further evaluation.   5. Mild pyuria, we'll treat as a urinary tract infection. We'll continue Rocephin as above. Urine culture has been ordered and is pending. 6. Hypotension, secondary to sepsis/possible septic shock and hypovolemia. Her blood pressure has improved with volume resuscitation. 7. Nausea and vomiting- lipase is within normal limits. She has no abdominal pain. She has a mildly isolated elevated AST of 39. We'll treat with Zofran IV as needed. We'll add IV Protonix. Full  diet 8. Atypical chest pain in a patient with coronary artery disease. Her EKG does not reveal any significant worrisome findings. Elevated troponin with no EKG changes-likely part of some septic process.  Defer workup at that Acute renal failure likely prerenal from volume depletion and hypotension. The patient's BUN is 26 and her creatinine is 1.44- ??? 18/1.02 She has no history of chronic kidney disease.  9. Hyperglycemia. The patient has no history of diabetes mellitus. We'll continue to monitor and order hemoglobin A1c. 10. AOCD - Fe less than 10 ferritin elevated 379 11. Chronic pain syndrome: Spinal stenosis, degenerative joint disease, peripheral neuropathy, and fibromyalgia. We'll continue the pain pump with morphine and baclofen. Her pain physician is in [place], Dr. Lorin Mercy in the pain pump was last done Monday so they can be some transient bacteremia or seeding from this area. We will monitor labs plus with infectious disease if needed     Code Status:  Full Family Communication: None bedside Disposition Plan:  SDU for now   Verneita Griffes, MD  [facility] Pager [phone] 10/06/2013, 1:49 PM    LOS: 2 days

## 2013-10-06 NOTE — Procedures (Signed)
lp completed without complication.

## 2013-10-06 NOTE — Progress Notes (Signed)
ANTIBIOTIC CONSULT NOTE - INITIAL  Pharmacy Consult for acyclovir Indication: r/o meningitis  No Known Allergies  Patient Measurements: Height: 5\' 10"  (177.8 cm) Weight: 239 lb 3.2 oz (108.5 kg) IBW/kg (Calculated) : 68.5  Vital Signs: Temp: 102.3 F (39.1 C) (04/07 1025) Temp src: Oral (04/07 0800) BP: 144/72 mmHg (04/07 0815) Pulse Rate: 92 (04/07 1025) Intake/Output from previous day: 04/06 0701 - 04/07 0700 In: 3098.8 [P.O.:660; I.V.:1738.8; IV Piggyback:700] Out: 3400 [Urine:3400] Intake/Output from this shift: Total I/O In: 80 [I.V.:80] Out: 675 [Urine:675]  Labs:  Recent Labs  10/04/13 1507 10/05/13 0136 10/05/13 0805 10/06/13 0900  WBC 5.4 5.3  --  5.9  HGB 11.2* 10.8*  --  11.2*  PLT 85* 75* 81* 104*  CREATININE 1.44* 1.02  --  0.81   Estimated Creatinine Clearance: 103.5 ml/min (by C-G formula based on Cr of 0.81). No results found for this basename: VANCOTROUGH, Corlis Leak, VANCORANDOM, GENTTROUGH, GENTPEAK, GENTRANDOM, TOBRATROUGH, TOBRAPEAK, TOBRARND, AMIKACINPEAK, AMIKACINTROU, AMIKACIN,  in the last 72 hours   Microbiology: Recent Results (from the past 720 hour(s))  CULTURE, BLOOD (ROUTINE X 2)     Status: None   Collection Time    10/04/13  2:45 PM      Result Value Ref Range Status   Specimen Description BLOOD LEFT FOREARM  5 ML IN [facility] BOTTLE   Final   Special Requests NONE   Final   Culture  Setup Time     Final   Value: 10/04/2013 18:35     Performed at [facility]   Culture     Final   Value: [place]     Note: Gram Stain Report Called to,Read Back By and Verified With: TVEDT WOODS ON 10/05/2013 AT 7:18P BY WILEJ     Performed at [facility]   Report Status PENDING   Incomplete  CULTURE, BLOOD (ROUTINE X 2)     Status: None   Collection Time    10/04/13  3:07 PM      Result Value Ref Range Status   Specimen Description BLOOD RIGHT ARM  5 ML IN [facility] BOTTLE   Final   Special Requests NONE   Final    Culture  Setup Time     Final   Value: 10/04/2013 20:08     Performed at [facility]   Culture     Final   Value: [place] IN CHAINS     Note: Gram Stain Report Called to,Read Back By and Verified With: TVEDT WOODS ON 10/05/2013 AT 7:18P BY WILEJ     Performed at [facility]   Report Status PENDING   Incomplete  RAPID STREP SCREEN     Status: None   Collection Time    10/04/13  3:23 PM      Result Value Ref Range Status   Streptococcus, Group A Screen (Direct) NEGATIVE  NEGATIVE Final   Comment: (NOTE)     A Rapid Antigen test may result negative if the antigen level in the     sample is below the detection level of this test. The FDA has not     cleared this test as a stand-alone test therefore the rapid antigen     negative result has reflexed to a Group A Strep culture.  CULTURE, GROUP A STREP     Status: None   Collection Time    10/04/13  3:23 PM      Result Value Ref  Range Status   Specimen Description THROAT   Final   Special Requests NONE   Final   Culture     Final   Value: NO SUSPICIOUS COLONIES, CONTINUING TO HOLD     Performed at [facility]   Report Status PENDING   Incomplete  URINE CULTURE     Status: None   Collection Time    10/04/13  5:21 PM      Result Value Ref Range Status   Specimen Description URINE, CLEAN CATCH   Final   Special Requests NONE   Final   Culture  Setup Time     Final   Value: 10/04/2013 21:11     Performed at [place]     Final   Value: NO GROWTH     Performed at [facility]   Culture     Final   Value: NO GROWTH     Performed at [facility]   Report Status 10/05/2013 FINAL   Final  MRSA PCR SCREENING     Status: None   Collection Time    10/04/13  8:53 PM      Result Value Ref Range Status   MRSA by PCR NEGATIVE  NEGATIVE Final   Comment:            The GeneXpert MRSA Assay (FDA     approved for NASAL specimens     only), is one component of a      comprehensive MRSA colonization     surveillance program. It is not     intended to diagnose MRSA     infection nor to guide or     monitor treatment for     MRSA infections.    Medical History: Past Medical History  Diagnosis Date  . Degenerative disc disease   . Spinal stenosis   . Facet joint disease   . Bulging disc   . MRSA (methicillin resistant staph aureus) culture positive 2011    GREAT TOE RIGHT FOOT  . Fibromyalgia   . Peripheral neuropathy   . MCL deficiency, knee   . Coronary arteritis   . CAD (coronary artery disease) 2009    s/p stent to LAD     Assessment: 65 yoF presents [facility] ED with worsening fever, chest pain, aches, sore throat. She was seen at urgent care 4/4 and was given Tamiflu for influenza and bronchitis. Admitted for IV antibiotics for possible sepsis. MD started Azithromycin and Ceftriaxone for CAP treatment, Tamiflu for influenza and consulted pharmacy to dose vancomycin given MRSA history. 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  4/5 >> vanc >> 4/5 >> azithromycin >> 4/5 >> ceftriaxone >> 4/5 >> tamiflu >>  Tmax: 103.2 last night WBCs: wnl Renal: SCr improved to 1.02, CrCl 69N PCT 20.27 > 11.04 Weight 108.5 kg  4/5 blood: 2/2 GPC in chains 4/6 blood (repeat): sent 4/5 rapid strep screen negative 4/5 group A strep cx: pending 4/5 MRSA screen negative 4/5 urine cx: NGF 4/6 resp virus panel: pending 4/5 influenza panel: negative 4/7 CSF gram stain and culture: ordered  Day #3 Vancomycin, Ceftriaxone, Azithromycin, Tamiflu.  MD now concerned for meningitis.  Acyclovir ordered per pharmacy consult for potential viral cause.  Since patient is obese, will use IBW for dosing.  Goal of Therapy:  Vancomycin trough level 15-20 mcg/ml Eradication of infection, doses adjusted  per renal clearance  Plan:  1.  Acyclovir 685 mg (10 mg/kg of IBW) IV q8h.  2.   Change Ceftriaxone to 2g IV q12h (meningitis dosing). 3.  F/u results of LP.  Hershal Coria 10/06/2013,11:11 AM

## 2013-10-06 NOTE — Progress Notes (Signed)
LP not indicative of meningitis, but continue empiric Rx until culutre neg noted. Will need to consult ID to hel[p determine duratio and course of Abx in am if still spiking fevers Await Blood cult  Verneita Griffes, MD [facility] [phone]

## 2013-10-07 DIAGNOSIS — R05 Cough: Secondary | ICD-10-CM

## 2013-10-07 DIAGNOSIS — R059 Cough, unspecified: Secondary | ICD-10-CM

## 2013-10-07 DIAGNOSIS — G894 Chronic pain syndrome: Secondary | ICD-10-CM

## 2013-10-07 LAB — CBC
HCT: 30.4 % — ABNORMAL LOW (ref 36.0–46.0)
Hemoglobin: 10.5 g/dL — ABNORMAL LOW (ref 12.0–15.0)
MCH: 28.8 pg (ref 26.0–34.0)
MCHC: 34.5 g/dL (ref 30.0–36.0)
MCV: 83.5 fL (ref 78.0–100.0)
Platelets: 122 10*3/uL — ABNORMAL LOW (ref 150–400)
RBC: 3.64 MIL/uL — ABNORMAL LOW (ref 3.87–5.11)
RDW: 13.4 % (ref 11.5–15.5)
WBC: 7.1 10*3/uL (ref 4.0–10.5)

## 2013-10-07 LAB — COMPREHENSIVE METABOLIC PANEL
ALT: 47 U/L — ABNORMAL HIGH (ref 0–35)
AST: 46 U/L — ABNORMAL HIGH (ref 0–37)
Albumin: 2.7 g/dL — ABNORMAL LOW (ref 3.5–5.2)
Alkaline Phosphatase: 103 U/L (ref 39–117)
BUN: 9 mg/dL (ref 6–23)
CO2: 29 mEq/L (ref 19–32)
Calcium: 8.3 mg/dL — ABNORMAL LOW (ref 8.4–10.5)
Chloride: 100 mEq/L (ref 96–112)
Creatinine, Ser: 0.94 mg/dL (ref 0.50–1.10)
GFR calc Af Amer: 77 mL/min — ABNORMAL LOW (ref >90)
GFR calc non Af Amer: 67 mL/min — ABNORMAL LOW (ref >90)
Glucose, Bld: 178 mg/dL — ABNORMAL HIGH (ref 70–99)
Potassium: 3.6 mEq/L — ABNORMAL LOW (ref 3.7–5.3)
Sodium: 139 mEq/L (ref 137–147)
Total Bilirubin: 0.5 mg/dL (ref 0.3–1.2)
Total Protein: 6.1 g/dL (ref 6.0–8.3)

## 2013-10-07 LAB — PROCALCITONIN: Procalcitonin: 6.75 ng/mL

## 2013-10-07 LAB — PROTIME-INR
INR: 1.16 (ref 0.00–1.49)
Prothrombin Time: 14.6 seconds (ref 11.6–15.2)

## 2013-10-07 LAB — VANCOMYCIN, TROUGH: Vancomycin Tr: 9.4 ug/mL — ABNORMAL LOW (ref 10.0–20.0)

## 2013-10-07 MED ORDER — IBUPROFEN 600 MG PO TABS
600.0000 mg | ORAL_TABLET | Freq: Four times a day (QID) | ORAL | Status: DC | PRN
  Administered 2013-10-07 – 2013-10-10 (×4): 600 mg via ORAL
  Filled 2013-10-07 (×4): qty 1

## 2013-10-07 MED ORDER — ENOXAPARIN SODIUM 40 MG/0.4ML ~~LOC~~ SOLN
40.0000 mg | SUBCUTANEOUS | Status: DC
  Administered 2013-10-07 – 2013-10-10 (×4): 40 mg via SUBCUTANEOUS
  Filled 2013-10-07 (×5): qty 0.4

## 2013-10-07 MED ORDER — PANTOPRAZOLE SODIUM 40 MG PO TBEC
40.0000 mg | DELAYED_RELEASE_TABLET | Freq: Every day | ORAL | Status: DC
  Administered 2013-10-07 – 2013-10-11 (×5): 40 mg via ORAL
  Filled 2013-10-07 (×5): qty 1

## 2013-10-07 MED ORDER — VANCOMYCIN HCL IN DEXTROSE 1-5 GM/200ML-% IV SOLN
1000.0000 mg | Freq: Three times a day (TID) | INTRAVENOUS | Status: DC
  Administered 2013-10-07 – 2013-10-09 (×7): 1000 mg via INTRAVENOUS
  Filled 2013-10-07 (×10): qty 200

## 2013-10-07 NOTE — Progress Notes (Signed)
10/07/13 1500  Clinical Encounter Type  Visited With Patient  Visit Type Follow-up;Spiritual support;Social support   Melinda [name] reports feeling better and being grateful to rule out some significant health issues as she moves through the process of identifying the source of her illness/symptoms.  She used this follow-up visit to process her experience of hospitalization so far and her gratitude for the care she has received.  Spiritual Care will continue to follow, but please also page as needs arise:  [phone].  Thank you.  [place], [place]

## 2013-10-07 NOTE — Progress Notes (Signed)
Rt inspected Pt home CPAP, no frayed cords or wires visible.  Machine is in proper working order.  RT setup pt CPAP, filled humidity chamber and bleed in 1 LPM O2 inline.  Pt will self administer CPAP when ready.  RT to monitor and assess as needed.

## 2013-10-07 NOTE — Progress Notes (Signed)
PT asked not to wear her CPAP. PT was complaining about our machine being too loud. RT explained that the exhalation valve  has to stay open. Her pressure is at 14 cmH20 and it does make noise. RT took CPAP out of the room per pt request.

## 2013-10-07 NOTE — Progress Notes (Signed)
Inpatient Diabetes Program Recommendations  AACE/ADA: New Consensus Statement on Inpatient Glycemic Control (2013)  Target Ranges:  Prepandial:   less than 140 mg/dL      Peak postprandial:   less than 180 mg/dL (1-2 hours)      Critically ill patients:  140 - 180 mg/dL   Reason for Assessment: Hyperglycemia  Diabetes history: No Outpatient Diabetes medications: None Current orders for Inpatient glycemic control: None  Results for MIRNA, DEFREITAS (MRN [id number]) as of 10/07/2013 10:16  Ref. Range 10/06/2013 09:00 10/07/2013 03:20  Glucose Latest Range: 70-99 mg/dL 160 (H) 178 (H)   Inpatient Diabetes Program Recommendations Correction (SSI): Add Novolog moderate tidwc and hs HgbA1C: 6.5% - doubt this is accurate d/t low H/H Diet: Add CHO mod med to heart healthy diet  Note: Will continue to follow. Thank you. Lorenda Peck, RD, LDN, CDE Inpatient Diabetes Coordinator [phone]

## 2013-10-07 NOTE — Progress Notes (Signed)
ANTIBIOTIC CONSULT NOTE - FOLLOW UP  Pharmacy Consult for Vancomycin Indication: rule out sepsis  No Known Allergies  Patient Measurements: Height: 5\' 10"  (177.8 cm) Weight: 239 lb 3.2 oz (108.5 kg) IBW/kg (Calculated) : 68.5  Vital Signs: Temp: 99.6 F (37.6 C) (04/08 0800) Temp src: Oral (04/08 0800) BP: 123/61 mmHg (04/08 0300) Pulse Rate: 87 (04/08 0844) Intake/Output from previous day: 04/07 0701 - 04/08 0700 In: 1931.1 [P.O.:340; I.V.:500; IV Piggyback:1091.1] Out: 3150 [Urine:3150] Intake/Output from this shift: Total I/O In: 60 [I.V.:60] Out: -   Labs:  Recent Labs  10/05/13 0136 10/05/13 0805 10/06/13 0900 10/07/13 0320  WBC 5.3  --  5.9 7.1  HGB 10.8*  --  11.2* 10.5*  PLT 75* 81* 104* 122*  CREATININE 1.02  --  0.81 0.94   Estimated Creatinine Clearance: 89.1 ml/min (by C-G formula based on Cr of 0.94). No results found for this basename: VANCOTROUGH, VANCOPEAK, VANCORANDOM, GENTTROUGH, GENTPEAK, GENTRANDOM, TOBRATROUGH, TOBRAPEAK, TOBRARND, AMIKACINPEAK, AMIKACINTROU, AMIKACIN,  in the last 72 hours     Assessment: 46 yoF presents to [facility] ED with worsening fever, chest pain, aches, sore throat.  She was seen at urgent care 4/4 and was given Tamiflu for influenza and bronchitis.  Admitted for IV antibiotics for possible sepsis.  MD started Azithromycin and Ceftriaxone for CAP treatment, Tamiflu for influenza and consulted pharmacy to dose vancomycin given MRSA history.  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  4/5 >> vanc >> 4/5 >> azithromycin >> 4/7 4/5 >> ceftriaxone >> 4/7 4/5 >> tamiflu >> 4/7 4/7 >> acyclovir >> 4/7  Tmax: 102.4 last night WBCs: wnl Renal: SCr 0.97, CrCl ~75N, >100CG PCT 20.27 > 11.04 > 6.75 4/8 Vancomycin trough = 9.4  4/5 blood: 2/2 GPC in chains 4/6 blood: sent 4/8 blood: ordered 4/5 rapid strep screen negative 4/5 group A strep cx:  negative 4/5 MRSA screen negative 4/5 urine cx: NGF 4/6 resp virus panel: + rhinovirus 4/5 influenza panel: negative 4/7: CSF: gram stain = no organisms, cx pending.   Day #4 Vancomycin 1g IV q12h for GPC bacteremia.  LP not indicative of meningitis so all other antibiotics have been discontinued today and continuing vancomycin for GPC bacteremia.  ID of culture and sensitivities pending.    Goal of Therapy:  Vancomycin trough level 15-20 mcg/ml  Plan:  1.  Adjust vancomycin to 1g IV q8h for low VT.  Recheck at new steady state. 2.  F/u SCr, trough level, clinical course.  Donald Prose Korey Prashad 10/07/2013,11:22 AM

## 2013-10-07 NOTE — Therapy (Signed)
Family brought Pt's own CPAP machine in to use. Left a message for [employer] to check machine.

## 2013-10-07 NOTE — Progress Notes (Addendum)
Melinda [name] OVF:[id number] DOB: 1955-07-20 DOA: 10/04/2013 PCP: Ted Mcalpine  Brief narrative: 58 y/o ? 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  Recently seen [facility] 4/4 for ? FLu-test was neg. blood pressure initially 77/46. Following 3 L of normal saline, her blood pressure is 97/58. Her temperature was initially 102.4, but is now 98.4 following Tylenol. Her lab data are significant for normal WBC, hemoglobin of 11.1, platelet count of 85, sodium of 136, BUN of 26, creatinine 1.44, and glucose of 162. Her chest x-ray reveals no acute disease. Her EKG reveals normal sinus rhythm with a heart rate of 87 beats per minute and nonspecific T-wave changes. Her urinalysis reveals small leukocytes and rare bacteria; color is cloudy. She developed increasing headache and continued to spike fevers and as such because of unknown source of infection, patient underwent lumbar puncture 4/7  Past medical history-As per Problem list Chart reviewed as below-   Consultants:  None so far  Procedures:  cxr 2 vw neg  Antibiotics:  Azithro 4/5  Ceftriaxone 4/5-4/8  Tamiflu 4/5  Vancomycin 4/5   Subjective   Headache improving, still feels weak, some cough   Objective    Interim History:   Telemetry: NSR   Objective: Filed Vitals:   10/07/13 0800 10/07/13 0844 10/07/13 1145 10/07/13 1211  BP:    103/50  Pulse: 77 87  76  Temp: 99.6 F (37.6 C)  98.8 F (37.1 C)   TempSrc: Oral  Oral   Resp: $Remo'27 18  18  'KXTBP$ Height:      Weight:      SpO2: 99% 96%  95%    Intake/Output Summary (Last 24 hours) at 10/07/13 1355 Last data filed at 10/07/13 1300  Gross per 24 hour  Intake 2207.4 ml  Output   2775 ml  Net -567.6 ml    Exam:  General: eomi, ncat Cardiovascular:  s1 s2 no  m/r/g Respiratory:CTAB Abdomen: soft NT ND BS present, baclofen pump insitu Skin no LLE edema Neuro: non focal  Data Reviewed: Basic Metabolic Panel:  Recent Labs Lab 10/04/13 1507 10/05/13 0136 10/06/13 0900 10/07/13 0320  NA 136* 138 138 139  K 3.7 3.9 3.4* 3.6*  CL 98 103 99 100  CO2 $Re'24 24 26 29  'WIb$ GLUCOSE 162* 122* 160* 178*  BUN 26* $Remov'18 8 9  'eTvmxs$ CREATININE 1.44* 1.02 0.81 0.94  CALCIUM 8.3* 7.6* 8.4 8.3*   Liver Function Tests:  Recent Labs Lab 10/04/13 1507 10/05/13 0136 10/06/13 0900 10/07/13 0320  AST 39* 43* 39* 46*  ALT 32 36* 39* 47*  ALKPHOS 91 90 108 103  BILITOT 0.6 0.5 0.7 0.5  PROT 6.5 6.2 6.7 6.1  ALBUMIN 3.1* 2.9* 2.9* 2.7*    Recent Labs Lab 10/04/13 1507  LIPASE 11   No results found for this basename: AMMONIA,  in the last 168 hours CBC:  Recent Labs Lab 10/04/13 1507 10/05/13 0136 10/05/13 0805 10/06/13 0900 10/07/13 0320  WBC 5.4 5.3  --  5.9 7.1  NEUTROABS  --   --   --  4.4  --   HGB 11.2* 10.8*  --  11.2* 10.5*  HCT 33.1* 32.9*  --  32.5* 30.4*  MCV 85.8 86.6  --  84.2 83.5  PLT 85* 75* 81* 104* 122*   Cardiac Enzymes:  Recent  Labs Lab 10/04/13 2000 10/05/13 0136 10/05/13 0805 10/05/13 1501 10/05/13 1950  TROPONINI <0.30 <0.30 0.62* <0.30 0.34*   BNP: No components found with this basename: POCBNP,  CBG: No results found for this basename: GLUCAP,  in the last 168 hours  Recent Results (from the past 240 hour(s))  CULTURE, BLOOD (ROUTINE X 2)     Status: None   Collection Time    10/04/13  2:45 PM      Result Value Ref Range Status   Specimen Description BLOOD LEFT FOREARM  5 ML IN [facility] BOTTLE   Final   Special Requests NONE   Final   Culture  Setup Time     Final   Value: 10/04/2013 18:35     Performed at [facility]   Culture     Final   Value: [place] IN CHAINS     Note: Gram Stain Report Called to,Read Back By and Verified With: TVEDT WOODS ON 10/05/2013 AT 7:18P BY WILEJ     Performed  at [facility]   Report Status PENDING   Incomplete  CULTURE, BLOOD (ROUTINE X 2)     Status: None   Collection Time    10/04/13  3:07 PM      Result Value Ref Range Status   Specimen Description BLOOD RIGHT ARM  5 ML IN [facility] BOTTLE   Final   Special Requests NONE   Final   Culture  Setup Time     Final   Value: 10/04/2013 20:08     Performed at [facility]   Culture     Final   Value: GRAM POSITIVE COCCI IN CHAINS     Note: Gram Stain Report Called to,Read Back By and Verified With: TVEDT WOODS ON 10/05/2013 AT 7:18P BY WILEJ     Performed at [facility]   Report Status PENDING   Incomplete  RAPID STREP SCREEN     Status: None   Collection Time    10/04/13  3:23 PM      Result Value Ref Range Status   Streptococcus, Group A Screen (Direct) NEGATIVE  NEGATIVE Final   Comment: (NOTE)     A Rapid Antigen test may result negative if the antigen level in the     sample is below the detection level of this test. The FDA has not     cleared this test as a stand-alone test therefore the rapid antigen     negative result has reflexed to a Group A Strep culture.  CULTURE, GROUP A STREP     Status: None   Collection Time    10/04/13  3:23 PM      Result Value Ref Range Status   Specimen Description THROAT   Final   Special Requests NONE   Final   Culture     Final   Value: No Beta Hemolytic Streptococci Isolated     Performed at [facility]   Report Status 10/06/2013 FINAL   Final  URINE CULTURE     Status: None   Collection Time    10/04/13  5:21 PM      Result Value Ref Range Status   Specimen Description URINE, CLEAN CATCH   Final   Special Requests NONE   Final   Culture  Setup Time     Final   Value: 10/04/2013 21:11     Performed at [place]  Final   Value: NO GROWTH     Performed at [facility]   Culture     Final   Value: NO GROWTH     Performed at [facility]   Report Status 10/05/2013  FINAL   Final  MRSA PCR SCREENING     Status: None   Collection Time    10/04/13  8:53 PM      Result Value Ref Range Status   MRSA by PCR NEGATIVE  NEGATIVE Final   Comment:            The GeneXpert MRSA Assay (FDA     approved for NASAL specimens     only), is one component of a     comprehensive MRSA colonization     surveillance program. It is not     intended to diagnose MRSA     infection nor to guide or     monitor treatment for     MRSA infections.  RESPIRATORY VIRUS PANEL     Status: Abnormal   Collection Time    10/05/13  9:46 AM      Result Value Ref Range Status   Source - RVPAN NASAL SWAB   Corrected   Comment: CORRECTED ON 04/07 AT 1842: PREVIOUSLY REPORTED AS NASAL SWAB   Respiratory Syncytial Virus A NOT DETECTED   Final   Respiratory Syncytial Virus B NOT DETECTED   Final   Influenza A NOT DETECTED   Final   Influenza B NOT DETECTED   Final   Parainfluenza 1 NOT DETECTED   Final   Parainfluenza 2 NOT DETECTED   Final   Parainfluenza 3 NOT DETECTED   Final   Metapneumovirus NOT DETECTED   Final   Rhinovirus DETECTED (*)  Final   Adenovirus NOT DETECTED   Final   Influenza A H1 NOT DETECTED   Final   Influenza A H3 NOT DETECTED   Final   Comment: (NOTE)           Normal Reference Range for each Analyte: NOT DETECTED     Testing performed using the Luminex xTAG Respiratory Viral Panel test     kit.     This test was developed and its performance characteristics determined     by [facility]. It has not been cleared or approved by the [place]     Food and Drug Administration. This test is used for clinical purposes.     It should not be regarded as investigational or for research. This     laboratory is certified under the [place] (CLIA) as qualified to perform high complexity     clinical laboratory testing.     Performed at [place], BLOOD (ROUTINE X 2)     Status: None   Collection  Time    10/05/13  7:35 PM      Result Value Ref Range Status   Specimen Description BLOOD RIGHT HAND   Final   Special Requests BOTTLES DRAWN AEROBIC ONLY 10 CC   Final   Culture  Setup Time     Final   Value: 10/06/2013 01:19     Performed at [facility]   Culture     Final   Value:        BLOOD CULTURE RECEIVED NO GROWTH TO DATE CULTURE WILL BE HELD FOR 5 DAYS BEFORE ISSUING A FINAL NEGATIVE REPORT  Performed at [facility]   Report Status PENDING   Incomplete  CULTURE, BLOOD (ROUTINE X 2)     Status: None   Collection Time    10/05/13  7:50 PM      Result Value Ref Range Status   Specimen Description BLOOD RIGHT ANTECUBITAL   Final   Special Requests BOTTLES DRAWN AEROBIC AND ANAEROBIC 10CC EACHA   Final   Culture  Setup Time     Final   Value: 10/06/2013 01:19     Performed at [facility]   Culture     Final   Value:        BLOOD CULTURE RECEIVED NO GROWTH TO DATE CULTURE WILL BE HELD FOR 5 DAYS BEFORE ISSUING A FINAL NEGATIVE REPORT     Performed at [facility]   Report Status PENDING   Incomplete  CSF CULTURE     Status: None   Collection Time    10/06/13 12:45 PM      Result Value Ref Range Status   Specimen Description CSF   Final   Special Requests NONE   Final   Gram Stain     Final   Value: WBC PRESENT, PREDOMINANTLY MONONUCLEAR     NO ORGANISMS SEEN     CYTOSPIN Performed by [facility]     Performed at [facility]   Culture     Final   Value: NO GROWTH 1 DAY     Note: Gram Stain Report Called to,Read Back By and Verified With: JOHNSON R AT 1500 ON [id number] BY POTEAT S     Performed at [facility]   Report Status PENDING   Incomplete  GRAM STAIN     Status: None   Collection Time    10/06/13 12:45 PM      Result Value Ref Range Status   Specimen Description CSF   Final   Special Requests NONE   Final   Gram Stain     Final   Value: NO ORGANISMS SEEN     WBC PRESENT, PREDOMINANTLY MONONUCLEAR      CYTOSPIN     Gram Stain Report Called to,Read Back By and Verified With: JOHNSON,R AT 1500 ON [id number] BY POTEAT,S   Report Status 10/06/2013 FINAL   Final     Studies:              All Imaging reviewed and is as per above notation   Scheduled Meds: . aspirin EC  81 mg Oral Daily  . atorvastatin  40 mg Oral q1800  . buPROPion  150 mg Oral Daily  . isosorbide dinitrate  30 mg Oral Daily  . pantoprazole  40 mg Oral Q1200  . PARoxetine  10 mg Oral Daily  . pregabalin  75 mg Oral Daily  . senna  1 tablet Oral Daily  . vancomycin  1,000 mg Intravenous 3 times per day   Continuous Infusions: . 0.9 % NaCl with KCl 20 mEq / L 20 mL/hr at 10/05/13 2157     Assessment/Plan: 1. Sepsis with lactic acidosis. Gr+ cocci growing in 2/2 bottles blood- source unlcear       -improving       -symptoms of resp infection but CXR negative       -await identification, repeat Blood Cx today       -will get ID input tomorrow pending Identification of bacteria       -LP negative       -  continue Vancomycin and DC ceftriaxone, doubt UTI  2. Upper respiratory infection, influenza PCR negative       -positive for rhinovirus  3. Thrombocytopenia       -mild, likely due to sepsis, acute illness monitor  4.   Acute renal failure likely prerenal from volume depletion and hypotension.       -improved with hydration  5.    Hyperglycemia.  no history of diabetes mellitus. hbaic 6.5,         6.   Chronic pain syndrome: Spinal stenosis, degenerative joint disease, peripheral neuropathy, and fibromyalgia. We'll continue the pain pump with morphine and baclofen. Her pain physician is in [place], Dr. Levy Sjogren refilled last Monday        7. Headache        -post LP and URI        -add ibuprofen PRN  DVT proph: add lovenox, monitor platelets  Code Status: Full Family Communication: None bedside Disposition Plan:  SDU for now   Domenic Polite, MD  [facility] Pager  [phone] 10/07/2013, 1:55 PM    LOS: 3 days

## 2013-10-07 NOTE — Plan of Care (Signed)
Problem: Phase I Progression Outcomes Goal: OOB as tolerated unless otherwise ordered Outcome: Progressing Up to bedside commode.       

## 2013-10-08 DIAGNOSIS — R079 Chest pain, unspecified: Secondary | ICD-10-CM

## 2013-10-08 DIAGNOSIS — D649 Anemia, unspecified: Secondary | ICD-10-CM

## 2013-10-08 LAB — CULTURE, BLOOD (ROUTINE X 2)

## 2013-10-08 LAB — CBC
HCT: 30.6 % — ABNORMAL LOW (ref 36.0–46.0)
Hemoglobin: 10.3 g/dL — ABNORMAL LOW (ref 12.0–15.0)
MCH: 28.3 pg (ref 26.0–34.0)
MCHC: 33.7 g/dL (ref 30.0–36.0)
MCV: 84.1 fL (ref 78.0–100.0)
Platelets: 144 10*3/uL — ABNORMAL LOW (ref 150–400)
RBC: 3.64 MIL/uL — ABNORMAL LOW (ref 3.87–5.11)
RDW: 13.5 % (ref 11.5–15.5)
WBC: 6.8 10*3/uL (ref 4.0–10.5)

## 2013-10-08 LAB — VANCOMYCIN, TROUGH: Vancomycin Tr: 18.8 ug/mL (ref 10.0–20.0)

## 2013-10-08 NOTE — Progress Notes (Signed)
[id number] Rosana Hoes, RN, BSN, CCM, [phone] Chart reviewed for update of needs and condition./ patient being moved from icu to med surg, sepsis improving, iv abx and resp support continued.

## 2013-10-08 NOTE — Progress Notes (Addendum)
Melinda [name]:[id number] DOB: 1955-07-08 DOA: 10/04/2013 PCP: Ted Mcalpine  Brief narrative: 58 y/o ? 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 Following 3 L of normal saline, her blood pressure is 97/58. Her temperature was initially 102.4, but is now 98.4 following Tylenol. Her lab data are significant for normal WBC, hemoglobin of 11.1, platelet count of 85, sodium of 136, BUN of 26, creatinine 1.44, and glucose of 162. Her chest x-ray reveals no acute disease. Her urinalysis reveals small leukocytes and rare bacteria; color is cloudy. She developed increasing headache and continued to spike fevers and as such because of unknown source of infection, patient underwent lumbar puncture 4/7  Past medical history-As per Problem list Chart reviewed as below-   Consultants:  None so far  Procedures:  cxr 2 vw neg  Antibiotics:  Azithro 4/5  Ceftriaxone 4/5-4/8  Tamiflu 4/5  Vancomycin 4/5   Subjective   Headache improving, still feels weak, some cough   Objective    Interim History:   Telemetry: NSR   Objective: Filed Vitals:   10/07/13 2300 10/08/13 0350 10/08/13 0414 10/08/13 0800  BP: 141/71  134/62   Pulse: 101  80   Temp: 98.8 F (37.1 C) 98 F (36.7 C)  98.8 F (37.1 C)  TempSrc: Oral Oral  Oral  Resp: 26  14   Height:      Weight:      SpO2: 86%  90%     Intake/Output Summary (Last 24 hours) at 10/08/13 1139 Last data filed at 10/08/13 0727  Gross per 24 hour  Intake   1630 ml  Output   2375 ml  Net   -745 ml    Exam:  General: eomi, ncat Cardiovascular:  s1 s2 no m/r/g Respiratory:CTAB Abdomen: soft NT ND BS present, baclofen pump insitu Skin no LLE edema Neuro: non  focal  Data Reviewed: Basic Metabolic Panel:  Recent Labs Lab 10/04/13 1507 10/05/13 0136 10/06/13 0900 10/07/13 0320  NA 136* 138 138 139  K 3.7 3.9 3.4* 3.6*  CL 98 103 99 100  CO2 $Re'24 24 26 29  'eNT$ GLUCOSE 162* 122* 160* 178*  BUN 26* $Remov'18 8 9  'uGTnYv$ CREATININE 1.44* 1.02 0.81 0.94  CALCIUM 8.3* 7.6* 8.4 8.3*   Liver Function Tests:  Recent Labs Lab 10/04/13 1507 10/05/13 0136 10/06/13 0900 10/07/13 0320  AST 39* 43* 39* 46*  ALT 32 36* 39* 47*  ALKPHOS 91 90 108 103  BILITOT 0.6 0.5 0.7 0.5  PROT 6.5 6.2 6.7 6.1  ALBUMIN 3.1* 2.9* 2.9* 2.7*    Recent Labs Lab 10/04/13 1507  LIPASE 11   No results found for this basename: AMMONIA,  in the last 168 hours CBC:  Recent Labs Lab 10/04/13 1507 10/05/13 0136 10/05/13 0805 10/06/13 0900 10/07/13 0320 10/08/13 0320  WBC 5.4 5.3  --  5.9 7.1 6.8  NEUTROABS  --   --   --  4.4  --   --   HGB 11.2* 10.8*  --  11.2* 10.5* 10.3*  HCT 33.1* 32.9*  --  32.5* 30.4* 30.6*  MCV 85.8 86.6  --  84.2 83.5 84.1  PLT 85* 75* 81* 104* 122* 144*   Cardiac Enzymes:  Recent Labs Lab 10/04/13 2000  10/05/13 0136 10/05/13 0805 10/05/13 1501 10/05/13 1950  TROPONINI <0.30 <0.30 0.62* <0.30 0.34*   BNP: No components found with this basename: POCBNP,  CBG: No results found for this basename: GLUCAP,  in the last 168 hours  Recent Results (from the past 240 hour(s))  CULTURE, BLOOD (ROUTINE X 2)     Status: None   Collection Time    10/04/13  2:45 PM      Result Value Ref Range Status   Specimen Description BLOOD LEFT FOREARM  5 ML IN [facility] BOTTLE   Final   Special Requests NONE   Final   Culture  Setup Time     Final   Value: 10/04/2013 18:35     Performed at [facility]   Culture     Final   Value: MICROAEROPHILIC STREPTOCOCCI     Note: Gram Stain Report Called to,Read Back By and Verified With: TVEDT WOODS ON 10/05/2013 AT 7:18P BY WILEJ     Performed at [facility]   Report Status 10/08/2013 FINAL    Final  CULTURE, BLOOD (ROUTINE X 2)     Status: None   Collection Time    10/04/13  3:07 PM      Result Value Ref Range Status   Specimen Description BLOOD RIGHT ARM  5 ML IN [facility] BOTTLE   Final   Special Requests NONE   Final   Culture  Setup Time     Final   Value: 10/04/2013 20:08     Performed at [facility]   Culture     Final   Value: MICROAEROPHILIC STREPTOCOCCI     Note: Gram Stain Report Called to,Read Back By and Verified With: TVEDT WOODS ON 10/05/2013 AT 7:18P BY WILEJ     Performed at [facility]   Report Status 10/08/2013 FINAL   Final  RAPID STREP SCREEN     Status: None   Collection Time    10/04/13  3:23 PM      Result Value Ref Range Status   Streptococcus, Group A Screen (Direct) NEGATIVE  NEGATIVE Final   Comment: (NOTE)     A Rapid Antigen test may result negative if the antigen level in the     sample is below the detection level of this test. The FDA has not     cleared this test as a stand-alone test therefore the rapid antigen     negative result has reflexed to a Group A Strep culture.  CULTURE, GROUP A STREP     Status: None   Collection Time    10/04/13  3:23 PM      Result Value Ref Range Status   Specimen Description THROAT   Final   Special Requests NONE   Final   Culture     Final   Value: No Beta Hemolytic Streptococci Isolated     Performed at [facility]   Report Status 10/06/2013 FINAL   Final  URINE CULTURE     Status: None   Collection Time    10/04/13  5:21 PM      Result Value Ref Range Status   Specimen Description URINE, CLEAN CATCH   Final   Special Requests NONE   Final   Culture  Setup Time     Final   Value: 10/04/2013 21:11     Performed at [facility]     Final   Value: NO GROWTH  Performed at [facility]     Final   Value: NO GROWTH     Performed at [facility]   Report Status 10/05/2013 FINAL   Final  MRSA PCR SCREENING     Status: None     Collection Time    10/04/13  8:53 PM      Result Value Ref Range Status   MRSA by PCR NEGATIVE  NEGATIVE Final   Comment:            The GeneXpert MRSA Assay (FDA     approved for NASAL specimens     only), is one component of a     comprehensive MRSA colonization     surveillance program. It is not     intended to diagnose MRSA     infection nor to guide or     monitor treatment for     MRSA infections.  RESPIRATORY VIRUS PANEL     Status: Abnormal   Collection Time    10/05/13  9:46 AM      Result Value Ref Range Status   Source - RVPAN NASAL SWAB   Corrected   Comment: CORRECTED ON 04/07 AT 1842: PREVIOUSLY REPORTED AS NASAL SWAB   Respiratory Syncytial Virus A NOT DETECTED   Final   Respiratory Syncytial Virus B NOT DETECTED   Final   Influenza A NOT DETECTED   Final   Influenza B NOT DETECTED   Final   Parainfluenza 1 NOT DETECTED   Final   Parainfluenza 2 NOT DETECTED   Final   Parainfluenza 3 NOT DETECTED   Final   Metapneumovirus NOT DETECTED   Final   Rhinovirus DETECTED (*)  Final   Adenovirus NOT DETECTED   Final   Influenza A H1 NOT DETECTED   Final   Influenza A H3 NOT DETECTED   Final   Comment: (NOTE)           Normal Reference Range for each Analyte: NOT DETECTED     Testing performed using the Luminex xTAG Respiratory Viral Panel test     kit.     This test was developed and its performance characteristics determined     by [facility]. It has not been cleared or approved by the [place]     Food and Drug Administration. This test is used for clinical purposes.     It should not be regarded as investigational or for research. This     laboratory is certified under the [place] (CLIA) as qualified to perform high complexity     clinical laboratory testing.     Performed at [facility], BLOOD (ROUTINE X 2)     Status: None   Collection Time    10/05/13  7:35 PM      Result Value Ref  Range Status   Specimen Description BLOOD RIGHT HAND   Final   Special Requests BOTTLES DRAWN AEROBIC ONLY 10 CC   Final   Culture  Setup Time     Final   Value: 10/06/2013 01:19     Performed at [facility]   Culture     Final   Value:        BLOOD CULTURE RECEIVED NO GROWTH TO DATE CULTURE WILL BE HELD FOR 5 DAYS BEFORE ISSUING A FINAL NEGATIVE REPORT     Performed at [facility]  Report Status PENDING   Incomplete  CULTURE, BLOOD (ROUTINE X 2)     Status: None   Collection Time    10/05/13  7:50 PM      Result Value Ref Range Status   Specimen Description BLOOD RIGHT ANTECUBITAL   Final   Special Requests BOTTLES DRAWN AEROBIC AND ANAEROBIC 10CC EACHA   Final   Culture  Setup Time     Final   Value: 10/06/2013 01:19     Performed at [facility]   Culture     Final   Value:        BLOOD CULTURE RECEIVED NO GROWTH TO DATE CULTURE WILL BE HELD FOR 5 DAYS BEFORE ISSUING A FINAL NEGATIVE REPORT     Performed at [facility]   Report Status PENDING   Incomplete  CSF CULTURE     Status: None   Collection Time    10/06/13 12:45 PM      Result Value Ref Range Status   Specimen Description CSF   Final   Special Requests NONE   Final   Gram Stain     Final   Value: WBC PRESENT, PREDOMINANTLY MONONUCLEAR     NO ORGANISMS SEEN     CYTOSPIN Performed by [facility]     Performed at [facility]   Culture     Final   Value: NO GROWTH 2 DAYS     Note: Gram Stain Report Called to,Read Back By and Verified With: JOHNSON R AT 1500 ON [id number] BY POTEAT S     Performed at [facility]   Report Status PENDING   Incomplete  GRAM STAIN     Status: None   Collection Time    10/06/13 12:45 PM      Result Value Ref Range Status   Specimen Description CSF   Final   Special Requests NONE   Final   Gram Stain     Final   Value: NO ORGANISMS SEEN     WBC PRESENT, PREDOMINANTLY MONONUCLEAR     CYTOSPIN     Gram Stain Report Called  to,Read Back By and Verified With: JOHNSON,R AT 1500 ON [id number] BY POTEAT,S   Report Status 10/06/2013 FINAL   Final  CULTURE, BLOOD (ROUTINE X 2)     Status: None   Collection Time    10/07/13  9:20 AM      Result Value Ref Range Status   Specimen Description BLOOD RIGHT ARM   Final   Special Requests BOTTLES DRAWN AEROBIC AND ANAEROBIC 10CC   Final   Culture  Setup Time     Final   Value: 10/07/2013 12:53     Performed at [facility]   Culture     Final   Value:        BLOOD CULTURE RECEIVED NO GROWTH TO DATE CULTURE WILL BE HELD FOR 5 DAYS BEFORE ISSUING A FINAL NEGATIVE REPORT     Performed at [facility]   Report Status PENDING   Incomplete  CULTURE, BLOOD (ROUTINE X 2)     Status: None   Collection Time    10/07/13 11:10 AM      Result Value Ref Range Status   Specimen Description BLOOD RIGHT ANTECUBITAL   Final   Special Requests BOTTLES DRAWN AEROBIC AND ANAEROBIC 5CC   Final   Culture  Setup Time     Final   Value: 10/07/2013 14:23  Performed at [facility]     Final   Value:        BLOOD CULTURE RECEIVED NO GROWTH TO DATE CULTURE WILL BE HELD FOR 5 DAYS BEFORE ISSUING A FINAL NEGATIVE REPORT     Performed at [facility]   Report Status PENDING   Incomplete     Studies:              All Imaging reviewed and is as per above notation   Scheduled Meds: . aspirin EC  81 mg Oral Daily  . atorvastatin  40 mg Oral q1800  . buPROPion  150 mg Oral Daily  . enoxaparin (LOVENOX) injection  40 mg Subcutaneous Q24H  . isosorbide dinitrate  30 mg Oral Daily  . pantoprazole  40 mg Oral Q1200  . PARoxetine  10 mg Oral Daily  . pregabalin  75 mg Oral Daily  . senna  1 tablet Oral Daily  . vancomycin  1,000 mg Intravenous 3 times per day   Continuous Infusions: . 0.9 % NaCl with KCl 20 mEq / L 20 mL/hr at 10/08/13 0124     Assessment/Plan: 1. Sepsis: Gram+ cocci bacteremia in 2/2 bottles blood- source unlcear, had symptoms of  URI/sore throat/bronhcitis on admission        -improving       -symptoms of resp infection but CXR negative       -await identification, repeat Blood Cx 4/8 pending       -will get ID input today pending Identification of bacteria       -LP negative       -continue Vancomycin and stopped ceftriaxone 4/8, doubt UTI  2. Upper respiratory infection, influenza PCR negative       -positive for rhinovirus, supportive care  3. Thrombocytopenia       -mild, likely due to sepsis, acute illness monitor  4.   Acute renal failure likely prerenal from volume depletion and hypotension.       -improved with hydration  5.    Hyperglycemia.  no history of diabetes mellitus. hbaic 6.5,         6.   Chronic pain syndrome: Spinal stenosis, degenerative joint disease, peripheral neuropathy, and fibromyalgia. We'll continue the pain pump with morphine and baclofen. Her pain physician is in [place], Dr. Levy Sjogren refilled last Monday        7. Headache        -post LP and URI        -improved, continue ibuprofen PRN  DVT proph: on lovenox, monitor platelets  Code Status: Full Family Communication: None bedside Disposition Plan:  Transfer to Tama Gander, MD  [facility] Pager [phone] 10/08/2013, 11:39 AM    LOS: 4 days

## 2013-10-08 NOTE — Progress Notes (Addendum)
ANTIBIOTIC CONSULT NOTE - FOLLOW UP  Pharmacy Consult for Vancomycin Indication: GPC bacteremia  No Known Allergies  Patient Measurements: Height: 5\' 10"  (177.8 cm) Weight: 239 lb 3.2 oz (108.5 kg) IBW/kg (Calculated) : 68.5  Vital Signs: Temp: 98.8 F (37.1 C) (04/09 0800) Temp src: Oral (04/09 0800) BP: 134/62 mmHg (04/09 0414) Pulse Rate: 80 (04/09 0414) Intake/Output from previous day: 04/08 0701 - 04/09 0700 In: 1910 [P.O.:800; I.V.:460; IV Piggyback:650] Out: 1925 [id number] Intake/Output from this shift: Total I/O In: -  Out: 650 [Urine:650]  Labs:  Recent Labs  10/06/13 0900 10/07/13 0320 10/08/13 0320  WBC 5.9 7.1 6.8  HGB 11.2* 10.5* 10.3*  PLT 104* 122* 144*  CREATININE 0.81 0.94  --    Estimated Creatinine Clearance: 89.1 ml/min (by C-G formula based on Cr of 0.94).  Recent Labs  10/07/13 1110 10/08/13 1111  VANCOTROUGH 9.4* 18.8       Assessment: [name] presents to [facility] ED with worsening fever, chest pain, aches, sore throat.  She was seen at urgent care 4/4 and was given Tamiflu for influenza and bronchitis.  Admitted for IV antibiotics for possible sepsis.  MD started Azithromycin and Ceftriaxone for CAP treatment, Tamiflu for influenza and consulted pharmacy to dose vancomycin given MRSA history.  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  4/5 >> vanc >> 4/5 >> azithromycin >> 4/7 4/5 >> ceftriaxone >> 4/7 4/5 >> tamiflu >> 4/7 4/7 >> acyclovir >> 4/7  Tmax: AF (last fever 102.4 4/7) WBCs: wnl Renal: SCr 0.94, CrCl ~75N, >100CG PCT 20.27 > 11.04 > 6.75 4/8 Vancomycin trough = 9.4 on 1g IV q12h dosing 4/9 Vancomycin trough = 18.8 on 1g IV q8h dosing  4/5 blood: 2/2 microaerophilic streptococci  4/6 blood: ngtd 4/8 blood: ordered 4/5 rapid strep screen negative 4/5 group A strep cx: negative 4/5 MRSA screen negative 4/5 urine cx: NGF 4/6 resp virus panel:  + rhinovirus 4/5 influenza panel: negative 4/7: CSF: gram stain = no organisms, cx pending.   Day #5 Vancomycin 1g IV q8h for microaerophilic streptococci bacteremia.  Culture most likely susceptible to beta-lactams.  PCT improving and repeat blood cultures clear so far on vancomycin.  Would consider consulting ID to determine if endocarditis work up for microaerophilic streptococci bacteremia is necessary.  LP performed 4/7 not indicative of meningitis.    Goal of Therapy:  Vancomycin trough level 15-20 mcg/ml  Plan:  1.  Continue vancomycin to 1g IV q8h.  Got to goal trough range only after 3 doses of 1g IV q8h.  Will recheck another VT tomorrow to make sure patient not accumulating. 2.  F/u SCr, trough level, clinical course, ID recommendations.  Melinda [name] 10/08/2013,12:03 PM

## 2013-10-09 DIAGNOSIS — B954 Other streptococcus as the cause of diseases classified elsewhere: Secondary | ICD-10-CM

## 2013-10-09 DIAGNOSIS — R7881 Bacteremia: Secondary | ICD-10-CM

## 2013-10-09 LAB — CBC
HCT: 31.9 % — ABNORMAL LOW (ref 36.0–46.0)
Hemoglobin: 10.9 g/dL — ABNORMAL LOW (ref 12.0–15.0)
MCH: 28.5 pg (ref 26.0–34.0)
MCHC: 34.2 g/dL (ref 30.0–36.0)
MCV: 83.5 fL (ref 78.0–100.0)
Platelets: 216 10*3/uL (ref 150–400)
RBC: 3.82 MIL/uL — ABNORMAL LOW (ref 3.87–5.11)
RDW: 13.5 % (ref 11.5–15.5)
WBC: 8.2 10*3/uL (ref 4.0–10.5)

## 2013-10-09 LAB — BASIC METABOLIC PANEL
BUN: 8 mg/dL (ref 6–23)
CO2: 32 mEq/L (ref 19–32)
Calcium: 8.7 mg/dL (ref 8.4–10.5)
Chloride: 100 mEq/L (ref 96–112)
Creatinine, Ser: 0.99 mg/dL (ref 0.50–1.10)
GFR calc Af Amer: 72 mL/min — ABNORMAL LOW (ref >90)
GFR calc non Af Amer: 63 mL/min — ABNORMAL LOW (ref >90)
Glucose, Bld: 163 mg/dL — ABNORMAL HIGH (ref 70–99)
Potassium: 3.6 mEq/L — ABNORMAL LOW (ref 3.7–5.3)
Sodium: 143 mEq/L (ref 137–147)

## 2013-10-09 MED ORDER — OXYCODONE HCL 5 MG PO TABS
5.0000 mg | ORAL_TABLET | Freq: Two times a day (BID) | ORAL | Status: DC
  Administered 2013-10-09 – 2013-10-11 (×4): 5 mg via ORAL
  Filled 2013-10-09 (×4): qty 1

## 2013-10-09 MED ORDER — DEXTROSE 5 % IV SOLN
2.0000 g | INTRAVENOUS | Status: DC
  Administered 2013-10-09 – 2013-10-11 (×3): 2 g via INTRAVENOUS
  Filled 2013-10-09 (×3): qty 2

## 2013-10-09 MED ORDER — TIZANIDINE HCL 4 MG PO TABS
4.0000 mg | ORAL_TABLET | Freq: Two times a day (BID) | ORAL | Status: DC
  Administered 2013-10-09 – 2013-10-11 (×4): 4 mg via ORAL
  Filled 2013-10-09 (×5): qty 1

## 2013-10-09 NOTE — Progress Notes (Signed)
Melinda [name] WGN:[id number] DOB: 25-Nov-1955 DOA: 10/04/2013 PCP: Ted Mcalpine  Brief narrative: 58 y/o ? 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 Following 3 L of normal saline, her blood pressure is 97/58. Her temperature was initially 102.4, but is now 98.4 following Tylenol. Her lab data are significant for normal WBC, hemoglobin of 11.1, platelet count of 85, sodium of 136, BUN of 26, creatinine 1.44, and glucose of 162. Her chest x-ray reveals no acute disease. Her urinalysis reveals small leukocytes and rare bacteria; color is cloudy. She developed increasing headache and continued to spike fevers and as such because of unknown source of infection, patient underwent lumbar puncture 4/7  Past medical history-As per Problem list Chart reviewed as below-   Consultants:  None so far  Procedures:  cxr 2 vw neg  Antibiotics:  Azithro 4/5  Ceftriaxone 4/5-4/8  Tamiflu 4/5  Vancomycin 4/5   Subjective   Feels better, no happy with staff/Rn/techs after moving out of SDU   Objective    Interim History:   Telemetry: NSR   Objective: Filed Vitals:   10/08/13 2337 10/09/13 0154 10/09/13 0323 10/09/13 0608  BP:    128/71  Pulse: 82   76  Temp:  100.5 F (38.1 C) 98.2 F (36.8 C) 98.1 F (36.7 C)  TempSrc:  Oral Oral Oral  Resp: 17   18  Height:      Weight:      SpO2: 96%   91%    Intake/Output Summary (Last 24 hours) at 10/09/13 1136 Last data filed at 10/09/13 0616  Gross per 24 hour  Intake    240 ml  Output   1150 ml  Net   -910 ml    Exam:  General: eomi, ncat Cardiovascular:  s1 s2 no m/r/g Respiratory:CTAB Abdomen: soft NT ND BS present, baclofen pump insitu Skin no LLE  edema Neuro: non focal  Data Reviewed: Basic Metabolic Panel:  Recent Labs Lab 10/04/13 1507 10/05/13 0136 10/06/13 0900 10/07/13 0320 10/09/13 0514  NA 136* 138 138 139 143  K 3.7 3.9 3.4* 3.6* 3.6*  CL 98 103 99 100 100  CO2 $Re'24 24 26 29 'gIO$ 32  GLUCOSE 162* 122* 160* 178* 163*  BUN 26* $Remov'18 8 9 8  'gxbwic$ CREATININE 1.44* 1.02 0.81 0.94 0.99  CALCIUM 8.3* 7.6* 8.4 8.3* 8.7   Liver Function Tests:  Recent Labs Lab 10/04/13 1507 10/05/13 0136 10/06/13 0900 10/07/13 0320  AST 39* 43* 39* 46*  ALT 32 36* 39* 47*  ALKPHOS 91 90 108 103  BILITOT 0.6 0.5 0.7 0.5  PROT 6.5 6.2 6.7 6.1  ALBUMIN 3.1* 2.9* 2.9* 2.7*    Recent Labs Lab 10/04/13 1507  LIPASE 11   No results found for this basename: AMMONIA,  in the last 168 hours CBC:  Recent Labs Lab 10/05/13 0136 10/05/13 0805 10/06/13 0900 10/07/13 0320 10/08/13 0320 10/09/13 0514  WBC 5.3  --  5.9 7.1 6.8 8.2  NEUTROABS  --   --  4.4  --   --   --   HGB 10.8*  --  11.2* 10.5* 10.3* 10.9*  HCT 32.9*  --  32.5* 30.4* 30.6* 31.9*  MCV 86.6  --  84.2 83.5 84.1 83.5  PLT 75*  81* 104* 122* 144* 216   Cardiac Enzymes:  Recent Labs Lab 10/04/13 2000 10/05/13 0136 10/05/13 0805 10/05/13 1501 10/05/13 1950  TROPONINI <0.30 <0.30 0.62* <0.30 0.34*   BNP: No components found with this basename: POCBNP,  CBG: No results found for this basename: GLUCAP,  in the last 168 hours  Recent Results (from the past 240 hour(s))  CULTURE, BLOOD (ROUTINE X 2)     Status: None   Collection Time    10/04/13  2:45 PM      Result Value Ref Range Status   Specimen Description BLOOD LEFT FOREARM  5 ML IN [facility] BOTTLE   Final   Special Requests NONE   Final   Culture  Setup Time     Final   Value: 10/04/2013 18:35     Performed at [facility]   Culture     Final   Value: MICROAEROPHILIC STREPTOCOCCI     Note: Gram Stain Report Called to,Read Back By and Verified With: TVEDT WOODS ON 10/05/2013 AT 7:18P BY WILEJ      Performed at [facility]   Report Status 10/08/2013 FINAL   Final  CULTURE, BLOOD (ROUTINE X 2)     Status: None   Collection Time    10/04/13  3:07 PM      Result Value Ref Range Status   Specimen Description BLOOD RIGHT ARM  5 ML IN [facility] BOTTLE   Final   Special Requests NONE   Final   Culture  Setup Time     Final   Value: 10/04/2013 20:08     Performed at [facility]   Culture     Final   Value: MICROAEROPHILIC STREPTOCOCCI     Note: Gram Stain Report Called to,Read Back By and Verified With: TVEDT WOODS ON 10/05/2013 AT 7:18P BY WILEJ     Performed at [facility]   Report Status 10/08/2013 FINAL   Final  RAPID STREP SCREEN     Status: None   Collection Time    10/04/13  3:23 PM      Result Value Ref Range Status   Streptococcus, Group A Screen (Direct) NEGATIVE  NEGATIVE Final   Comment: (NOTE)     A Rapid Antigen test may result negative if the antigen level in the     sample is below the detection level of this test. The FDA has not     cleared this test as a stand-alone test therefore the rapid antigen     negative result has reflexed to a Group A Strep culture.  CULTURE, GROUP A STREP     Status: None   Collection Time    10/04/13  3:23 PM      Result Value Ref Range Status   Specimen Description THROAT   Final   Special Requests NONE   Final   Culture     Final   Value: No Beta Hemolytic Streptococci Isolated     Performed at [facility]   Report Status 10/06/2013 FINAL   Final  URINE CULTURE     Status: None   Collection Time    10/04/13  5:21 PM      Result Value Ref Range Status   Specimen Description URINE, CLEAN CATCH   Final   Special Requests NONE   Final   Culture  Setup Time     Final   Value: 10/04/2013 21:11     Performed at [facility]  Colony Count     Final   Value: NO GROWTH     Performed at [facility]   Culture     Final   Value: NO GROWTH     Performed at [facility]   Report  Status 10/05/2013 FINAL   Final  MRSA PCR SCREENING     Status: None   Collection Time    10/04/13  8:53 PM      Result Value Ref Range Status   MRSA by PCR NEGATIVE  NEGATIVE Final   Comment:            The GeneXpert MRSA Assay (FDA     approved for NASAL specimens     only), is one component of a     comprehensive MRSA colonization     surveillance program. It is not     intended to diagnose MRSA     infection nor to guide or     monitor treatment for     MRSA infections.  RESPIRATORY VIRUS PANEL     Status: Abnormal   Collection Time    10/05/13  9:46 AM      Result Value Ref Range Status   Source - RVPAN NASAL SWAB   Corrected   Comment: CORRECTED ON 04/07 AT 1842: PREVIOUSLY REPORTED AS NASAL SWAB   Respiratory Syncytial Virus A NOT DETECTED   Final   Respiratory Syncytial Virus B NOT DETECTED   Final   Influenza A NOT DETECTED   Final   Influenza B NOT DETECTED   Final   Parainfluenza 1 NOT DETECTED   Final   Parainfluenza 2 NOT DETECTED   Final   Parainfluenza 3 NOT DETECTED   Final   Metapneumovirus NOT DETECTED   Final   Rhinovirus DETECTED (*)  Final   Adenovirus NOT DETECTED   Final   Influenza A H1 NOT DETECTED   Final   Influenza A H3 NOT DETECTED   Final   Comment: (NOTE)           Normal Reference Range for each Analyte: NOT DETECTED     Testing performed using the Luminex xTAG Respiratory Viral Panel test     kit.     This test was developed and its performance characteristics determined     by [facility]. It has not been cleared or approved by the [place]     Food and Drug Administration. This test is used for clinical purposes.     It should not be regarded as investigational or for research. This     laboratory is certified under the [place] (CLIA) as qualified to perform high complexity     clinical laboratory testing.     Performed at [place], BLOOD (ROUTINE X 2)     Status:  None   Collection Time    10/05/13  7:35 PM      Result Value Ref Range Status   Specimen Description BLOOD RIGHT HAND   Final   Special Requests BOTTLES DRAWN AEROBIC ONLY 10 CC   Final   Culture  Setup Time     Final   Value: 10/06/2013 01:19     Performed at [facility]   Culture     Final   Value:        BLOOD CULTURE RECEIVED NO GROWTH TO DATE CULTURE WILL BE HELD FOR 5 DAYS BEFORE ISSUING  A FINAL NEGATIVE REPORT     Performed at [facility]   Report Status PENDING   Incomplete  CULTURE, BLOOD (ROUTINE X 2)     Status: None   Collection Time    10/05/13  7:50 PM      Result Value Ref Range Status   Specimen Description BLOOD RIGHT ANTECUBITAL   Final   Special Requests BOTTLES DRAWN AEROBIC AND ANAEROBIC 10CC EACHA   Final   Culture  Setup Time     Final   Value: 10/06/2013 01:19     Performed at [facility]   Culture     Final   Value:        BLOOD CULTURE RECEIVED NO GROWTH TO DATE CULTURE WILL BE HELD FOR 5 DAYS BEFORE ISSUING A FINAL NEGATIVE REPORT     Performed at [facility]   Report Status PENDING   Incomplete  CSF CULTURE     Status: None   Collection Time    10/06/13 12:45 PM      Result Value Ref Range Status   Specimen Description CSF   Final   Special Requests NONE   Final   Gram Stain     Final   Value: WBC PRESENT, PREDOMINANTLY MONONUCLEAR     NO ORGANISMS SEEN     CYTOSPIN Performed by [facility]     Performed at [facility]   Culture     Final   Value: NO GROWTH 2 DAYS     Note: Gram Stain Report Called to,Read Back By and Verified With: JOHNSON R AT 1500 ON [id number] BY POTEAT S     Performed at [facility]   Report Status PENDING   Incomplete  GRAM STAIN     Status: None   Collection Time    10/06/13 12:45 PM      Result Value Ref Range Status   Specimen Description CSF   Final   Special Requests NONE   Final   Gram Stain     Final   Value: NO ORGANISMS SEEN     WBC PRESENT,  PREDOMINANTLY MONONUCLEAR     CYTOSPIN     Gram Stain Report Called to,Read Back By and Verified With: JOHNSON,R AT 1500 ON [id number] BY POTEAT,S   Report Status 10/06/2013 FINAL   Final  CULTURE, BLOOD (ROUTINE X 2)     Status: None   Collection Time    10/07/13  9:20 AM      Result Value Ref Range Status   Specimen Description BLOOD RIGHT ARM   Final   Special Requests BOTTLES DRAWN AEROBIC AND ANAEROBIC 10CC   Final   Culture  Setup Time     Final   Value: 10/07/2013 12:53     Performed at [facility]   Culture     Final   Value:        BLOOD CULTURE RECEIVED NO GROWTH TO DATE CULTURE WILL BE HELD FOR 5 DAYS BEFORE ISSUING A FINAL NEGATIVE REPORT     Performed at [facility]   Report Status PENDING   Incomplete  CULTURE, BLOOD (ROUTINE X 2)     Status: None   Collection Time    10/07/13 11:10 AM      Result Value Ref Range Status   Specimen Description BLOOD RIGHT ANTECUBITAL   Final   Special Requests BOTTLES DRAWN AEROBIC AND ANAEROBIC 5CC   Final   Culture  Setup Time     Final   Value: 10/07/2013 14:23     Performed at [facility]   Culture     Final   Value:        BLOOD CULTURE RECEIVED NO GROWTH TO DATE CULTURE WILL BE HELD FOR 5 DAYS BEFORE ISSUING A FINAL NEGATIVE REPORT     Performed at [facility]   Report Status PENDING   Incomplete     Studies:              All Imaging reviewed and is as per above notation   Scheduled Meds: . aspirin EC  81 mg Oral Daily  . atorvastatin  40 mg Oral q1800  . buPROPion  150 mg Oral Daily  . enoxaparin (LOVENOX) injection  40 mg Subcutaneous Q24H  . isosorbide dinitrate  30 mg Oral Daily  . pantoprazole  40 mg Oral Q1200  . PARoxetine  10 mg Oral Daily  . pregabalin  75 mg Oral Daily  . senna  1 tablet Oral Daily  . vancomycin  1,000 mg Intravenous 3 times per day   Continuous Infusions: . 0.9 % NaCl with KCl 20 mEq / L 20 mL/hr at 10/08/13 0124     Assessment/Plan: 1. Microaerophilic  Strep bacteremia/Sepsis       - source unlcear, had symptoms of URI/sore throat/bronhcitis on admission        -improving       -symptoms of resp infection but CXR negative       -repeat Blood Cx 4/8 negative so far       -ID consult requested       -LP negative       -continue Vancomycin and stopped ceftriaxone 4/8, doubt UTI       -check 2D ECHO  2. Upper respiratory infection, influenza PCR negative       -positive for rhinovirus, supportive care  3. Thrombocytopenia       -mild, likely due to sepsis, acute illness monitor  4.   Acute renal failure likely prerenal from volume depletion and hypotension.       -improved with hydration  5.    Hyperglycemia.  no history of diabetes mellitus. hbaic 6.5,         6.   Chronic pain syndrome: Spinal stenosis, degenerative joint disease, peripheral neuropathy, and fibromyalgia. We'll continue the pain pump with morphine and baclofen. Her pain physician is in [place], Dr. Levy Sjogren refilled last Monday        7. Headache        -post LP and URI        -improved, continue ibuprofen PRN  DVT proph: on lovenox, monitor platelets  Code Status: Full Family Communication: None bedside Disposition Plan:  Home pending workup   Domenic Polite, MD  [facility] Pager [phone] 10/09/2013, 11:36 AM    LOS: 5 days

## 2013-10-09 NOTE — Consult Note (Signed)
[place] for Infectious Disease  Total days of antibiotics 6        (3 days of ctx, azithro)       Reason for Consult: microaerophilic strep bacteremia    Referring Physician: Broadus John  Principal Problem:   Sepsis Active Problems:   Chronic pain syndrome   Hypotension   URI (upper respiratory infection)   UTI (urinary tract infection)   Thrombocytopenia   Acute renal failure   Anemia   Nausea and vomiting   Chest pain   CAD (coronary artery disease)    HPI: Melinda [name] is a 58 y.o. female with hx of CAD s/p PCI, fibromyalgia,  and remote mrsa infection s/p toe amputation treated for osteo. Who now presented with sepsis including fever, chills, malaise non productive cough. Prior to admit on 4/5 she remembered having 2 days of feeling poorly with sore throat but no fever, then acute felt worse 2 days later when she sought care  at urgent care the day prior and treated for influenza empricially. In the ED she was found febrile to 103, sbP 77/47, given 3L NS for fluid rescucitation. Started empirically on CAP abtx plus vanco. Labs did not show any leukocytosis cxr was without acute disease. She had RVP that revealed rhinovirus. She did have 2 sets of blood cx show microaerophilic strep on 02/03/45 cx x2. On repeat blood cx NGTD on 4/6. adn 4/8. She appeared to improved, with declining fever curve but did have isolated temp of 100.5 yesterday. Currently on day #6 of vancomycin. Underwent LP on 4/7 which was unrevealing. CSF culture negative. She denies any recent dental work, tooth pain, no sick contacts. Has had ongoing frontal headache since admit, slightly improved. No prior hx of ha or migraines.   Past Medical History  Diagnosis Date  . Degenerative disc disease   . Spinal stenosis   . Facet joint disease   . Bulging disc   . MRSA (methicillin resistant staph aureus) culture positive 2011    GREAT TOE RIGHT FOOT  . Fibromyalgia   . Peripheral neuropathy   . MCL  deficiency, knee   . Coronary arteritis   . CAD (coronary artery disease) 2009    s/p stent to LAD    Allergies: No Known Allergies  MEDICATIONS: . aspirin EC  81 mg Oral Daily  . atorvastatin  40 mg Oral q1800  . buPROPion  150 mg Oral Daily  . enoxaparin (LOVENOX) injection  40 mg Subcutaneous Q24H  . isosorbide dinitrate  30 mg Oral Daily  . pantoprazole  40 mg Oral Q1200  . PARoxetine  10 mg Oral Daily  . pregabalin  75 mg Oral Daily  . senna  1 tablet Oral Daily  . vancomycin  1,000 mg Intravenous 3 times per day    History  Substance Use Topics  . Smoking status: Former Research scientist (life sciences)  . Smokeless tobacco: Not on file  . Alcohol Use: No    History reviewed. No pertinent family history.  Review of Systems  Constitutional: Negative for fever, chills, diaphoresis, activity change, appetite change, fatigue and unexpected weight change.  HENT: Negative for congestion, sore throat, rhinorrhea, sneezing, trouble swallowing and sinus pressure.  Eyes: Negative for photophobia and visual disturbance.  Respiratory: + for cough, but chest tightness, shortness of breath, wheezing and stridor.  Cardiovascular: Negative for chest pain, palpitations and leg swelling.  Gastrointestinal: Negative for nausea, vomiting, abdominal pain, diarrhea, constipation, blood in stool, abdominal distention and anal  bleeding.  Genitourinary: Negative for dysuria, hematuria, flank pain and difficulty urinating.  Musculoskeletal: Negative for myalgias, back pain, joint swelling, arthralgias and gait problem.  Skin: Negative for color change, pallor, rash and wound.  Neurological: Negative for dizziness, tremors, weakness and light-headedness.  Hematological: Negative for adenopathy. Does not bruise/bleed easily.  Psychiatric/Behavioral: Negative for behavioral problems, confusion, sleep disturbance, dysphoric mood, decreased concentration and agitation.     OBJECTIVE: Temp:  [97.9 F (36.6 C)-100.5 F  (38.1 C)] 98.1 F (36.7 C) (04/10 1259) Pulse Rate:  [74-82] 76 (04/10 1259) Resp:  [17-20] 19 (04/10 1259) BP: (128-158)/(67-80) 145/67 mmHg (04/10 1259) SpO2:  [91 %-97 %] 97 % (04/10 1259)  Physical Exam  Constitutional:  oriented to person, place, and time. appears well-developed and well-nourished. No distress. Fatigue appearing HENT:  Mouth/Throat: Oropharynx is clear and moist. No oropharyngeal exudate.  Cardiovascular: Normal rate, regular rhythm and normal heart sounds. Exam reveals no gallop and no friction rub.  No murmur heard.  Pulmonary/Chest: Effort normal and breath sounds normal. No respiratory distress.  has no wheezes.  Abdominal: Soft. Bowel sounds are normal.  exhibits no distension. There is no tenderness.  Lymphadenopathy: no cervical adenopathy.  Neurological: alert and oriented to person, place, and time.  Skin: Skin is warm and dry. No rash noted. No erythema.  Psychiatric: a normal mood and affect.  behavior is normal.   LABS: Results for orders placed during the hospital encounter of 10/04/13 (from the past 48 hour(s))  CBC     Status: Abnormal   Collection Time    10/08/13  3:20 AM      Result Value Ref Range   WBC 6.8  4.0 - 10.5 K/uL   RBC 3.64 (*) 3.87 - 5.11 MIL/uL   Hemoglobin 10.3 (*) 12.0 - 15.0 g/dL   HCT 30.6 (*) 36.0 - 46.0 %   MCV 84.1  78.0 - 100.0 fL   MCH 28.3  26.0 - 34.0 pg   MCHC 33.7  30.0 - 36.0 g/dL   RDW 13.5  11.5 - 15.5 %   Platelets 144 (*) 150 - 400 K/uL  VANCOMYCIN, TROUGH     Status: None   Collection Time    10/08/13 11:11 AM      Result Value Ref Range   Vancomycin Tr 18.8  10.0 - 20.0 ug/mL  CBC     Status: Abnormal   Collection Time    10/09/13  5:14 AM      Result Value Ref Range   WBC 8.2  4.0 - 10.5 K/uL   RBC 3.82 (*) 3.87 - 5.11 MIL/uL   Hemoglobin 10.9 (*) 12.0 - 15.0 g/dL   HCT 31.9 (*) 36.0 - 46.0 %   MCV 83.5  78.0 - 100.0 fL   MCH 28.5  26.0 - 34.0 pg   MCHC 34.2  30.0 - 36.0 g/dL   RDW 13.5   11.5 - 15.5 %   Platelets 216  150 - 400 K/uL   Comment: DELTA CHECK NOTED     REPEATED TO VERIFY  BASIC METABOLIC PANEL     Status: Abnormal   Collection Time    10/09/13  5:14 AM      Result Value Ref Range   Sodium 143  137 - 147 mEq/L   Potassium 3.6 (*) 3.7 - 5.3 mEq/L   Chloride 100  96 - 112 mEq/L   CO2 32  19 - 32 mEq/L   Glucose, Bld 163 (*) 70 -  99 mg/dL   BUN 8  6 - 23 mg/dL   Creatinine, Ser 0.99  0.50 - 1.10 mg/dL   Calcium 8.7  8.4 - 10.5 mg/dL   GFR calc non Af Amer 63 (*) >90 mL/min   GFR calc Af Amer 72 (*) >90 mL/min   Comment: (NOTE)     The eGFR has been calculated using the CKD EPI equation.     This calculation has not been validated in all clinical situations.     eGFR's persistently <90 mL/min signify possible Chronic Kidney     Disease.    MICRO: 4/5 Microaerophilic strep pen G 0.301 4/6 blood cx ngtd 4/8 blood cx pending  IMAGING: No results found.   Assessment/Plan:  Highly sensitive to penicillin microaerophic strep bacteremia - would d/c vanco, change to ceftriaxone 2gm iv daily - would get TTE to see if any signs of vegetation - if TTE is negative, would just treat for 2 wk, can switch to orals when ready for discharge. Use April 6th as day 1 of antibiotics

## 2013-10-09 NOTE — Progress Notes (Signed)
Patient encouraged to be out of bed in recliner today.  No complaints of pain or voiced needs at this  Time.

## 2013-10-09 NOTE — Progress Notes (Signed)
Patient complaining of headache, medicated, blinds closed, given oral fluids, cool clothes for eyes and forehead.   Room thermostat adjusted for comfort.

## 2013-10-09 NOTE — Progress Notes (Signed)
Patient encouraged to be out of bed in recliner some of today.  Ambulates to bathroom with steady gait, tolerates well.

## 2013-10-10 DIAGNOSIS — R509 Fever, unspecified: Secondary | ICD-10-CM

## 2013-10-10 DIAGNOSIS — A419 Sepsis, unspecified organism: Secondary | ICD-10-CM

## 2013-10-10 DIAGNOSIS — I2510 Atherosclerotic heart disease of native coronary artery without angina pectoris: Secondary | ICD-10-CM

## 2013-10-10 DIAGNOSIS — R7881 Bacteremia: Secondary | ICD-10-CM

## 2013-10-10 LAB — CSF CULTURE W GRAM STAIN: Culture: NO GROWTH

## 2013-10-10 NOTE — Evaluation (Signed)
Physical Therapy Evaluation Patient Details Name: Melinda [name] MRN: [id number] DOB: 06-13-1956 Today's Date: 10/10/2013   History of Present Illness  58 y/o ? known CAD s/p stent LAD 2009, AVNRT c unsuccessful Ablation 1992 Chronic pain c Baclofen/morphine pump + fibromylagia, MRSA 2011 + chronic Osteo ankle/foot + R great toe amputation 2011 + multiple other surgeries presented to ER with fevers, chills, sore throat/cough  Clinical Impression  Pt presents mod I with all mobility, appears at baseline level of functioning.  No further acute PT needs identified at this time.    Follow Up Recommendations No PT follow up    Equipment Recommendations  None recommended by PT    Recommendations for Other Services       Precautions / Restrictions Restrictions Weight Bearing Restrictions: No      Mobility  Bed Mobility Overal bed mobility: Modified Independent                Transfers Overall transfer level: Modified independent                  Ambulation/Gait Ambulation/Gait assistance: Modified independent (Device/Increase time) Ambulation Distance (Feet): 150 Feet Assistive device: None       General Gait Details: no LOB, decreased cadence  Stairs Stairs: Yes Stairs assistance: Modified independent (Device/Increase time) Stair Management: One rail Left Number of Stairs: 4 General stair comments: Pt with step to pattern for safety  Wheelchair Mobility    Modified Rankin (Stroke Patients Only)       Balance                                             Pertinent Vitals/Pain Pt c/o headache, RN aware    Home Living Family/patient expects to be discharged to:: Private residence Living Arrangements: Spouse/significant other Available Help at Discharge: Family Type of Home: House Home Access: Stairs to enter Entrance Stairs-Rails: Left Entrance Stairs-Number of Steps: 5 Home Layout: One level Home Equipment: None       Prior Function Level of Independence: Independent               Hand Dominance        Extremity/Trunk Assessment               Lower Extremity Assessment: Overall WFL for tasks assessed      Cervical / Trunk Assessment: Normal  Communication   Communication: No difficulties  Cognition Arousal/Alertness: Awake/alert Behavior During Therapy: Flat affect Overall Cognitive Status: Within Functional Limits for tasks assessed                      General Comments      Exercises        Assessment/Plan    PT Assessment Patent does not need any further PT services  PT Diagnosis     PT Problem List    PT Treatment Interventions     PT Goals (Current goals can be found in the Care Plan section) Acute Rehab PT Goals PT Goal Formulation: No goals set, d/c therapy    Frequency     Barriers to discharge        Co-evaluation               End of Session   Activity Tolerance: Patient tolerated treatment well Patient left: in bed;with call bell/phone within reach  Nurse Communication: Mobility status         Time: [id number] PT Time Calculation (min): 10 min   Charges:   PT Evaluation $Initial PT Evaluation Tier I: 1 Procedure     PT G Codes:          Kennith Gain 10/10/2013, 10:51 AM

## 2013-10-10 NOTE — Progress Notes (Signed)
RT went to check with patient to see if pt needed any help with her home CPAP machine. Patient said she was okay and could do it herself. RT will continue to moniter.

## 2013-10-10 NOTE — Progress Notes (Addendum)
Melinda [name] OJJ:[id number] DOB: 05-04-1956 DOA: 10/04/2013 PCP: Ted Mcalpine  Brief narrative: 58 y/o ? 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 Following 3 L of normal saline, her blood pressure is 97/58. Her temperature was initially 102.4, but is now 98.4 following Tylenol. Her lab data are significant for normal WBC, hemoglobin of 11.1, platelet count of 85, sodium of 136, BUN of 26, creatinine 1.44, and glucose of 162. Her chest x-ray reveals no acute disease. Her urinalysis reveals small leukocytes and rare bacteria; color is cloudy. She developed increasing headache and continued to spike fevers and as such because of unknown source of infection, patient underwent lumbar puncture 4/7  Past medical history-As per Problem list Chart reviewed as below-   Consultants:  None so far  Procedures:  cxr 2 vw neg  Antibiotics:  Azithro 4/5  Ceftriaxone 4/5-4/8  Tamiflu 4/5  Vancomycin 4/5   Subjective   Overall better, slight HA   Objective    Interim History:   Telemetry: NSR   Objective: Filed Vitals:   10/09/13 1259 10/09/13 2045 10/09/13 2200 10/10/13 0519  BP: 145/67  153/76 160/83  Pulse: 76 81 79 72  Temp: 98.1 F (36.7 C)  98.3 F (36.8 C) 99 F (37.2 C)  TempSrc: Oral  Oral Oral  Resp: _0 Height:      Weight:      SpO2: 97%  92% 94%   No intake or output data in the 24 hours ending 10/10/13 1152  Exam:  General: eomi, ncat Cardiovascular:  s1 s2 no m/r/g Respiratory:CTAB Abdomen: soft NT ND BS present, baclofen pump insitu Skin no LLE edema Neuro: non focal  Data Reviewed: Basic Metabolic Panel:  Recent Labs Lab 10/04/13 1507 10/05/13 0136 10/06/13 0900  10/07/13 0320 10/09/13 0514  NA 136* 138 138 139 143  K 3.7 3.9 3.4* 3.6* 3.6*  CL 98 103 99 100 100  CO2 _1 32  GLUCOSE 162* 122* 160* 178* 163*  BUN 26* _2 CREATININE 1.44* 1.02 0.81 0.94 0.99  CALCIUM 8.3* 7.6* 8.4 8.3* 8.7   Liver Function Tests:  Recent Labs Lab 10/04/13 1507 10/05/13 0136 10/06/13 0900 10/07/13 0320  AST 39* 43* 39* 46*  ALT 32 36* 39* 47*  ALKPHOS 91 90 108 103  BILITOT 0.6 0.5 0.7 0.5  PROT 6.5 6.2 6.7 6.1  ALBUMIN 3.1* 2.9* 2.9* 2.7*    Recent Labs Lab 10/04/13 1507  LIPASE 11   No results found for this basename: AMMONIA,  in the last 168 hours CBC:  Recent Labs Lab 10/05/13 0136 10/05/13 0805 10/06/13 0900 10/07/13 0320 10/08/13 0320 10/09/13 0514  WBC 5.3  --  5.9 7.1 6.8 8.2  NEUTROABS  --   --  4.4  --   --   --   HGB 10.8*  --  11.2* 10.5* 10.3* 10.9*  HCT 32.9*  --  32.5* 30.4* 30.6* 31.9*  MCV 86.6  --  84.2 83.5 84.1 83.5  PLT 75* 81* 104* 122* 144* 216   Cardiac Enzymes:  Recent Labs Lab 10/04/13 2000 10/05/13 0136 10/05/13 0805 10/05/13 1501 10/05/13 1950  TROPONINI <0.30 <0.30 0.62* <0.30 0.34*   BNP: No components found  with this basename: POCBNP,  CBG: No results found for this basename: GLUCAP,  in the last 168 hours  Recent Results (from the past 240 hour(s))  CULTURE, BLOOD (ROUTINE X 2)     Status: None   Collection Time    10/04/13  2:45 PM      Result Value Ref Range Status   Specimen Description BLOOD LEFT FOREARM  5 ML IN [facility] BOTTLE   Final   Special Requests NONE   Final   Culture  Setup Time     Final   Value: 10/04/2013 18:35     Performed at [facility]   Culture     Final   Value: MICROAEROPHILIC STREPTOCOCCI     Note: Gram Stain Report Called to,Read Back By and Verified With: TVEDT WOODS ON 10/05/2013 AT 7:18P BY WILEJ     Performed at [facility]   Report Status 10/08/2013 FINAL   Final  CULTURE, BLOOD (ROUTINE X 2)     Status: None   Collection  Time    10/04/13  3:07 PM      Result Value Ref Range Status   Specimen Description BLOOD RIGHT ARM  5 ML IN [facility] BOTTLE   Final   Special Requests NONE   Final   Culture  Setup Time     Final   Value: 10/04/2013 20:08     Performed at [facility]   Culture     Final   Value: MICROAEROPHILIC STREPTOCOCCI     Note: Gram Stain Report Called to,Read Back By and Verified With: TVEDT WOODS ON 10/05/2013 AT 7:18P BY WILEJ     Performed at [facility]   Report Status 10/08/2013 FINAL   Final  RAPID STREP SCREEN     Status: None   Collection Time    10/04/13  3:23 PM      Result Value Ref Range Status   Streptococcus, Group A Screen (Direct) NEGATIVE  NEGATIVE Final   Comment: (NOTE)     A Rapid Antigen test may result negative if the antigen level in the     sample is below the detection level of this test. The FDA has not     cleared this test as a stand-alone test therefore the rapid antigen     negative result has reflexed to a Group A Strep culture.  CULTURE, GROUP A STREP     Status: None   Collection Time    10/04/13  3:23 PM      Result Value Ref Range Status   Specimen Description THROAT   Final   Special Requests NONE   Final   Culture     Final   Value: No Beta Hemolytic Streptococci Isolated     Performed at [facility]   Report Status 10/06/2013 FINAL   Final  URINE CULTURE     Status: None   Collection Time    10/04/13  5:21 PM      Result Value Ref Range Status   Specimen Description URINE, CLEAN CATCH   Final   Special Requests NONE   Final   Culture  Setup Time     Final   Value: 10/04/2013 21:11     Performed at [place]     Final   Value: NO GROWTH     Performed at [facility]   Culture     Final   Value: NO GROWTH  Performed at [facility]   Report Status 10/05/2013 FINAL   Final  MRSA PCR SCREENING     Status: None   Collection Time    10/04/13  8:53 PM      Result Value Ref Range  Status   MRSA by PCR NEGATIVE  NEGATIVE Final   Comment:            The GeneXpert MRSA Assay (FDA     approved for NASAL specimens     only), is one component of a     comprehensive MRSA colonization     surveillance program. It is not     intended to diagnose MRSA     infection nor to guide or     monitor treatment for     MRSA infections.  RESPIRATORY VIRUS PANEL     Status: Abnormal   Collection Time    10/05/13  9:46 AM      Result Value Ref Range Status   Source - RVPAN NASAL SWAB   Corrected   Comment: CORRECTED ON 04/07 AT 1842: PREVIOUSLY REPORTED AS NASAL SWAB   Respiratory Syncytial Virus A NOT DETECTED   Final   Respiratory Syncytial Virus B NOT DETECTED   Final   Influenza A NOT DETECTED   Final   Influenza B NOT DETECTED   Final   Parainfluenza 1 NOT DETECTED   Final   Parainfluenza 2 NOT DETECTED   Final   Parainfluenza 3 NOT DETECTED   Final   Metapneumovirus NOT DETECTED   Final   Rhinovirus DETECTED (*)  Final   Adenovirus NOT DETECTED   Final   Influenza A H1 NOT DETECTED   Final   Influenza A H3 NOT DETECTED   Final   Comment: (NOTE)           Normal Reference Range for each Analyte: NOT DETECTED     Testing performed using the Luminex xTAG Respiratory Viral Panel test     kit.     This test was developed and its performance characteristics determined     by [facility]. It has not been cleared or approved by the [place]     Food and Drug Administration. This test is used for clinical purposes.     It should not be regarded as investigational or for research. This     laboratory is certified under the [place] (CLIA) as qualified to perform high complexity     clinical laboratory testing.     Performed at [place], BLOOD (ROUTINE X 2)     Status: None   Collection Time    10/05/13  7:35 PM      Result Value Ref Range Status   Specimen Description BLOOD RIGHT HAND   Final   Special  Requests BOTTLES DRAWN AEROBIC ONLY 10 CC   Final   Culture  Setup Time     Final   Value: 10/06/2013 01:19     Performed at [facility]   Culture     Final   Value:        BLOOD CULTURE RECEIVED NO GROWTH TO DATE CULTURE WILL BE HELD FOR 5 DAYS BEFORE ISSUING A FINAL NEGATIVE REPORT     Performed at [facility]   Report Status PENDING   Incomplete  CULTURE, BLOOD (ROUTINE X 2)     Status: None   Collection Time  10/05/13  7:50 PM      Result Value Ref Range Status   Specimen Description BLOOD RIGHT ANTECUBITAL   Final   Special Requests BOTTLES DRAWN AEROBIC AND ANAEROBIC 10CC EACHA   Final   Culture  Setup Time     Final   Value: 10/06/2013 01:19     Performed at [facility]   Culture     Final   Value:        BLOOD CULTURE RECEIVED NO GROWTH TO DATE CULTURE WILL BE HELD FOR 5 DAYS BEFORE ISSUING A FINAL NEGATIVE REPORT     Performed at [facility]   Report Status PENDING   Incomplete  CSF CULTURE     Status: None   Collection Time    10/06/13 12:45 PM      Result Value Ref Range Status   Specimen Description CSF   Final   Special Requests NONE   Final   Gram Stain     Final   Value: WBC PRESENT, PREDOMINANTLY MONONUCLEAR     NO ORGANISMS SEEN     CYTOSPIN Performed by [facility]     Performed at [facility]   Culture     Final   Value: NO GROWTH 3 DAYS     Note: Gram Stain Report Called to,Read Back By and Verified With: JOHNSON R AT 1500 ON [id number] BY POTEAT S     Performed at [facility]   Report Status PENDING   Incomplete  GRAM STAIN     Status: None   Collection Time    10/06/13 12:45 PM      Result Value Ref Range Status   Specimen Description CSF   Final   Special Requests NONE   Final   Gram Stain     Final   Value: NO ORGANISMS SEEN     WBC PRESENT, PREDOMINANTLY MONONUCLEAR     CYTOSPIN     Gram Stain Report Called to,Read Back By and Verified With: JOHNSON,R AT 1500 ON [id number] BY POTEAT,S    Report Status 10/06/2013 FINAL   Final  CULTURE, BLOOD (ROUTINE X 2)     Status: None   Collection Time    10/07/13  9:20 AM      Result Value Ref Range Status   Specimen Description BLOOD RIGHT ARM   Final   Special Requests BOTTLES DRAWN AEROBIC AND ANAEROBIC 10CC   Final   Culture  Setup Time     Final   Value: 10/07/2013 12:53     Performed at [facility]   Culture     Final   Value:        BLOOD CULTURE RECEIVED NO GROWTH TO DATE CULTURE WILL BE HELD FOR 5 DAYS BEFORE ISSUING A FINAL NEGATIVE REPORT     Performed at [facility]   Report Status PENDING   Incomplete  CULTURE, BLOOD (ROUTINE X 2)     Status: None   Collection Time    10/07/13 11:10 AM      Result Value Ref Range Status   Specimen Description BLOOD RIGHT ANTECUBITAL   Final   Special Requests BOTTLES DRAWN AEROBIC AND ANAEROBIC 5CC   Final   Culture  Setup Time     Final   Value: 10/07/2013 14:23     Performed at [facility]   Culture     Final   Value:        BLOOD  CULTURE RECEIVED NO GROWTH TO DATE CULTURE WILL BE HELD FOR 5 DAYS BEFORE ISSUING A FINAL NEGATIVE REPORT     Performed at [facility]   Report Status PENDING   Incomplete     Studies:              All Imaging reviewed and is as per above notation   Scheduled Meds: . aspirin EC  81 mg Oral Daily  . atorvastatin  40 mg Oral q1800  . buPROPion  150 mg Oral Daily  . cefTRIAXone (ROCEPHIN)  IV  2 g Intravenous Q24H  . enoxaparin (LOVENOX) injection  40 mg Subcutaneous Q24H  . isosorbide dinitrate  30 mg Oral Daily  . oxyCODONE  5 mg Oral BID  . pantoprazole  40 mg Oral Q1200  . PARoxetine  10 mg Oral Daily  . pregabalin  75 mg Oral Daily  . senna  1 tablet Oral Daily  . tiZANidine  4 mg Oral BID   Continuous Infusions:     Assessment/Plan: 1. Microaerophilic Strep bacteremia/Sepsis       - source unlcear, had symptoms of URI/sore throat/bronhcitis on admission        -improving       -symptoms of resp  infection but CXR negative       -repeat Blood Cx 4/8 negative so far       -ID consult greatly appreciated       -LP negative       -continue ceftriaxone , if 2D ECHO could transtion to PO Abx and discharge home for 2 weeks total from 4/6 per ID recs      -2d ECHO still pending  2. Upper respiratory infection, influenza PCR negative       -positive for rhinovirus, supportive care  3. Thrombocytopenia       -mild, likely due to sepsis, acute illness        -resolved  4.   Acute renal failure likely prerenal from volume depletion and hypotension.       -improved with hydration  5.    Hyperglycemia.  no history of diabetes mellitus. hbaic 6.5,         6.   Chronic pain syndrome: Spinal stenosis, degenerative joint disease, peripheral neuropathy, and fibromyalgia. We'll continue the pain pump with morphine and baclofen. Her pain physician is in [place], Dr. Levy Sjogren refilled last Monday        7. Headache        -post LP and URI        -improved, continue ibuprofen PRN  DVT proph: on lovenox,  Code Status: Full Family Communication: None bedside Disposition Plan:  Home tomorrow if ECHO negative  Domenic Polite, MD  [facility] Pager [phone] 10/10/2013, 11:52 AM    LOS: 6 days

## 2013-10-10 NOTE — Progress Notes (Signed)
Echocardiogram 2D Echocardiogram has been performed.  Melinda [name] 10/10/2013, 12:05 PM

## 2013-10-10 NOTE — Progress Notes (Signed)
Gave Patient an IS and instructed her on how to use it, to help her ween off oxygen per MD order.  Patient getting up to 1250.

## 2013-10-11 DIAGNOSIS — R7881 Bacteremia: Secondary | ICD-10-CM | POA: Diagnosis present

## 2013-10-11 MED ORDER — AMOXICILLIN-POT CLAVULANATE 875-125 MG PO TABS: 1.0000 | ORAL_TABLET | Freq: Two times a day (BID) | ORAL | Status: DC

## 2013-10-11 NOTE — Discharge Summary (Addendum)
Physician Discharge Summary  Melinda [name] ERD:[id number] DOB: 1956-03-07 DOA: 10/04/2013  PCP: Ted Mcalpine  Admit date: 10/04/2013 Discharge date: 10/11/2013  Time spent: 45 minutes  Recommendations for Outpatient Follow-up:  1. PCP in 1week   Discharge Diagnoses:    Sepsis   Microaerophilic streptococcus Bacteremia   Chronic pain syndrome   Hypotension   URI (upper respiratory infection)   Thrombocytopenia   Acute renal failure   Anemia   Nausea and vomiting   Chest pain   CAD (coronary artery disease)   Discharge Condition: stable  Diet recommendation: heart healthy  Filed Weights   10/04/13 1414 10/04/13 2052 10/05/13 0400  Weight: 105.235 kg (232 lb) 108.5 kg (239 lb 3.2 oz) 108.5 kg (239 lb 3.2 oz)    History of present illness:  58 y/o ? 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 Following 3 L of normal saline, her blood pressure is 97/58. Her temperature was initially 102.4,. Her lab data are significant for normal WBC, hemoglobin of 11.1, platelet count of 85, sodium of 136, BUN of 26, creatinine 1.44, and glucose of 162. Her chest x-ray reveals no acute disease. Her urinalysis reveals small leukocytes and rare bacteria; color is cloudy.  She developed increasing headache and continued to spike fevers and underwent lumbar puncture 4/7, which was negative for meningitis  Hospital Course:  1. Microaerophilic Strep bacteremia/Sepsis -had symptoms of URI/sore throat/bronhcitis on admission  -improving  -blood cultures on admission grew microaerophilic streptococci -symptoms of resp infection but CXR negative  -repeat Blood Cx 4/8 negative so far  -Infectious disease consult greatly appreciated  -LP  negative  -Was on Vancomycin initially and then IV ceftriaxone , if 2D ECHO negative per infectious diseasecould transtion to PO Abx and discharge home for 2 weeks total starting from 4/6 per ID recommendations  -2D ECHO result finally obtained and negative for endocarditis and hence will complete Oral antibiotic course as detailed above  2. Upper respiratory infection, influenza PCR negative -positive for rhinovirus, improved with supportive care   3. Thrombocytopenia -mild, likely due to sepsis, acute illness  -resolved   4. Acute renal failure likely prerenal from volume depletion and hypotension.  -resolved with hydration   5. Chronic pain syndrome: Spinal stenosis, degenerative joint disease, peripheral neuropathy, and fibromyalgia. Continue the pain pump with morphine and baclofen. Her pain physician is in [place], Dr. Levy Sjogren refilled last Monday  7. Headache  -post LP and URI  -improved     Consultations:  ID Dr.Snider  Discharge Exam: Filed Vitals:   10/11/13 0918  BP: 149/79  Pulse: 84  Temp: 98.5 F (36.9 C)  Resp: 20    General: AAOx3 Cardiovascular: S!S2/RRR Respiratory: CTAB  Discharge Instructions You were cared for by a hospitalist during your hospital stay. If you have any questions about your discharge medications or the care you received while you were in the hospital after you are discharged, you can call the unit and asked to speak with the hospitalist on call if the hospitalist that took care of you is not available. Once you are discharged, your primary care physician will handle any further medical issues. Please note that NO REFILLS for any discharge medications will be authorized once you are discharged, as it is imperative  that you return to your primary care physician (or establish a relationship with a primary care physician if you do not have one) for your aftercare needs so that they can reassess your need for medications and monitor  your lab values.  Discharge Orders   Future Appointments Provider Department Dept Phone   02/17/2014 2:30 PM Mark Skains, MD [facility] [address] [phone]   Future Orders Complete By Expires   Diet - low sodium heart healthy  As directed    Increase activity slowly  As directed        Medication List         amoxicillin-clavulanate 875-125 MG per tablet  Commonly known as:  AUGMENTIN  Take 1 tablet by mouth 2 (two) times daily. For 6 days     aspirin 81 MG tablet  Take 81 mg by mouth daily.     BACLOFEN PO  Take 65.87 mcg by mouth daily. Continuous infusion with morphine pump.     buPROPion 150 MG 24 hr tablet  Commonly known as:  WELLBUTRIN XL  Take 150 mg by mouth daily.     isosorbide dinitrate 30 MG tablet  Commonly known as:  ISORDIL  Take 30 mg by mouth daily.     LYRICA 75 MG capsule  Generic drug:  pregabalin  Take 75 mg by mouth daily.     multivitamin with minerals Tabs tablet  Take 1 tablet by mouth daily.     NON FORMULARY  8.587 mg by Intravenous (Continuous Infusion) route daily. MORPHINE PUMP     oseltamivir 75 MG capsule  Commonly known as:  TAMIFLU  Take 75 mg by mouth 2 (two) times daily. For 5 days.     oxyCODONE 5 MG immediate release tablet  Commonly known as:  Oxy IR/ROXICODONE  Take 5 mg by mouth 3 (three) times daily as needed for severe pain (1 to 3 times daily as needed for pain.).     PARoxetine 20 MG tablet  Commonly known as:  PAXIL  Take 10 mg by mouth daily. Take 0.5 tablet by mouth twice daily     rosuvastatin 20 MG tablet  Commonly known as:  CRESTOR  Take 1 tablet (20 mg total) by mouth daily.     tiZANidine 4 MG tablet  Commonly known as:  ZANAFLEX  Take 4 mg by mouth 3 (three) times daily with meals as needed for muscle spasms (2 to 3 times daily.).       No Known Allergies     Follow-up Information   Follow up with PCP . Schedule an appointment as soon as possible for a visit in 1 week.       The  results of significant diagnostics from this hospitalization (including imaging, microbiology, ancillary and laboratory) are listed below for reference.    Significant Diagnostic Studies: Dg Chest 2 View  10/04/2013   CLINICAL DATA:  Chest pain  EXAM: CHEST  2 VIEW  COMPARISON:  08/28/2012  FINDINGS: Cardiomediastinal silhouette is stable. No acute infiltrate or pleural effusion. No pulmonary edema. Mild elevation of the right hemidiaphragm.  IMPRESSION: No active cardiopulmonary disease.   Electronically Signed   By: Liviu  Pop M.D.   On: 10/04/2013 14:46   Ct Head Wo Contrast  10/06/2013   CLINICAL DATA:  Severe headache and fever anticipate lumbar puncture  EXAM: CT HEAD WITHOUT CONTRAST  TECHNIQUE: Contiguous axial images were obtained from the base of the skull through the vertex without intravenous contrast.    COMPARISON:  None.  FINDINGS: The ventricles are normal in size and position. There is no intracranial hemorrhage nor intracranial mass effect. There is an area of encephalomalacia in the anterior frontal deep white matter measuring just over 11 mm in greatest AP dimension. The cerebellum and brainstem are normal in density.  At bone window settings there is likely retention cyst or polyp inferiorly in the left maxillary sinus. The paranasal sinuses otherwise appear clear where visualized. The mastoid air cells also appear clear. There is no lytic or blastic bony lesion of the calvarium.  IMPRESSION: 1. There is no evidence of increased intracranial pressure. 2. There is no evidence of an acute ischemic or hemorrhagic infarction. 3. There is a focal area of decreased density in the deep white matter of the left frontal lobe compatible with encephalomalacia. 4. If the patient's symptoms persist and remain unexplained, further evaluation with cranial MRI may be useful.   Electronically Signed   By: David  Jordan   On: 10/06/2013 11:53   Dg Fluoro Guide Lumbar Puncture  10/06/2013   CLINICAL DATA:   Fever.  EXAM: DIAGNOSTIC LUMBAR PUNCTURE UNDER FLUOROSCOPIC GUIDANCE  FLUOROSCOPY TIME:  0 min 51 seconds  PROCEDURE: Informed consent was obtained from the patient prior to the procedure, including potential complications of headache, allergy, and pain. With the patient prone, the lower back was prepped with Betadine. 1% Lidocaine was used for local anesthesia. Lumbar puncture was performed at the L5-S1 level using a 22 gauge needle with return of clear CSF with an opening pressure of 19 cm water. 10ml of CSF were obtained for laboratory studies. The patient tolerated the procedure well and there were no apparent complications.  IMPRESSION: Successful for directed lumbar puncture. Clear CSF was obtained and sent to the laboratory.   Electronically Signed   By: Thomas  Register   On: 10/06/2013 15:21    Microbiology: Recent Results (from the past 240 hour(s))  CULTURE, BLOOD (ROUTINE X 2)     Status: None   Collection Time    10/04/13  2:45 PM      Result Value Ref Range Status   Specimen Description BLOOD LEFT FOREARM  5 ML IN EACH BOTTLE   Final   Special Requests NONE   Final   Culture  Setup Time     Final   Value: 10/04/2013 18:35     Performed at [facility]   Culture     Final   Value: MICROAEROPHILIC STREPTOCOCCI     Note: Gram Stain Report Called to,Read Back By and Verified With: TVEDT WOODS ON 10/05/2013 AT 7:18P BY WILEJ     Performed at [facility]   Report Status 10/08/2013 FINAL   Final  CULTURE, BLOOD (ROUTINE X 2)     Status: None   Collection Time    10/04/13  3:07 PM      Result Value Ref Range Status   Specimen Description BLOOD RIGHT ARM  5 ML IN EACH BOTTLE   Final   Special Requests NONE   Final   Culture  Setup Time     Final   Value: 10/04/2013 20:08     Performed at [facility]   Culture     Final   Value: MICROAEROPHILIC STREPTOCOCCI     Note: Gram Stain Report Called to,Read Back By and Verified With: TVEDT WOODS ON 10/05/2013 AT 7:18P  BY WILEJ     Performed at [facility]   Report Status   10/08/2013 FINAL   Final  RAPID STREP SCREEN     Status: None   Collection Time    10/04/13  3:23 PM      Result Value Ref Range Status   Streptococcus, Group A Screen (Direct) NEGATIVE  NEGATIVE Final   Comment: (NOTE)     A Rapid Antigen test may result negative if the antigen level in the     sample is below the detection level of this test. The FDA has not     cleared this test as a stand-alone test therefore the rapid antigen     negative result has reflexed to a Group A Strep culture.  CULTURE, GROUP A STREP     Status: None   Collection Time    10/04/13  3:23 PM      Result Value Ref Range Status   Specimen Description THROAT   Final   Special Requests NONE   Final   Culture     Final   Value: No Beta Hemolytic Streptococci Isolated     Performed at [facility]   Report Status 10/06/2013 FINAL   Final  URINE CULTURE     Status: None   Collection Time    10/04/13  5:21 PM      Result Value Ref Range Status   Specimen Description URINE, CLEAN CATCH   Final   Special Requests NONE   Final   Culture  Setup Time     Final   Value: 10/04/2013 21:11     Performed at [facility]   Colony Count     Final   Value: NO GROWTH     Performed at [facility]   Culture     Final   Value: NO GROWTH     Performed at [facility]   Report Status 10/05/2013 FINAL   Final  MRSA PCR SCREENING     Status: None   Collection Time    10/04/13  8:53 PM      Result Value Ref Range Status   MRSA by PCR NEGATIVE  NEGATIVE Final   Comment:            The GeneXpert MRSA Assay (FDA     approved for NASAL specimens     only), is one component of a     comprehensive MRSA colonization     surveillance program. It is not     intended to diagnose MRSA     infection nor to guide or     monitor treatment for     MRSA infections.  RESPIRATORY VIRUS PANEL     Status: Abnormal   Collection Time     10/05/13  9:46 AM      Result Value Ref Range Status   Source - RVPAN NASAL SWAB   Corrected   Comment: CORRECTED ON 04/07 AT 1842: PREVIOUSLY REPORTED AS NASAL SWAB   Respiratory Syncytial Virus A NOT DETECTED   Final   Respiratory Syncytial Virus B NOT DETECTED   Final   Influenza A NOT DETECTED   Final   Influenza B NOT DETECTED   Final   Parainfluenza 1 NOT DETECTED   Final   Parainfluenza 2 NOT DETECTED   Final   Parainfluenza 3 NOT DETECTED   Final   Metapneumovirus NOT DETECTED   Final   Rhinovirus DETECTED (*)  Final   Adenovirus NOT DETECTED   Final   Influenza A H1 NOT DETECTED     Final   Influenza A H3 NOT DETECTED   Final   Comment: (NOTE)           Normal Reference Range for each Analyte: NOT DETECTED     Testing performed using the Luminex xTAG Respiratory Viral Panel test     kit.     This test was developed and its performance characteristics determined     by [facility]. It has not been cleared or approved by the [place]     Food and Drug Administration. This test is used for clinical purposes.     It should not be regarded as investigational or for research. This     laboratory is certified under the Clinical Laboratory Improvement     Amendments of 1988 (CLIA) as qualified to perform high complexity     clinical laboratory testing.     Performed at [facility]  CULTURE, BLOOD (ROUTINE X 2)     Status: None   Collection Time    10/05/13  7:35 PM      Result Value Ref Range Status   Specimen Description BLOOD RIGHT HAND   Final   Special Requests BOTTLES DRAWN AEROBIC ONLY 10 CC   Final   Culture  Setup Time     Final   Value: 10/06/2013 01:19     Performed at [facility]   Culture     Final   Value:        BLOOD CULTURE RECEIVED NO GROWTH TO DATE CULTURE WILL BE HELD FOR 5 DAYS BEFORE ISSUING A FINAL NEGATIVE REPORT     Performed at [facility]   Report Status PENDING   Incomplete  CULTURE, BLOOD (ROUTINE X 2)     Status: None    Collection Time    10/05/13  7:50 PM      Result Value Ref Range Status   Specimen Description BLOOD RIGHT ANTECUBITAL   Final   Special Requests BOTTLES DRAWN AEROBIC AND ANAEROBIC 10CC EACHA   Final   Culture  Setup Time     Final   Value: 10/06/2013 01:19     Performed at [facility]   Culture     Final   Value:        BLOOD CULTURE RECEIVED NO GROWTH TO DATE CULTURE WILL BE HELD FOR 5 DAYS BEFORE ISSUING A FINAL NEGATIVE REPORT     Performed at [facility]   Report Status PENDING   Incomplete  CSF CULTURE     Status: None   Collection Time    10/06/13 12:45 PM      Result Value Ref Range Status   Specimen Description CSF   Final   Special Requests NONE   Final   Gram Stain     Final   Value: WBC PRESENT, PREDOMINANTLY MONONUCLEAR     NO ORGANISMS SEEN     CYTOSPIN Performed by [facility]     Performed at [facility]   Culture     Final   Value: NO GROWTH 3 DAYS     Note: Gram Stain Report Called to,Read Back By and Verified With: JOHNSON R AT 1500 ON [id number] BY POTEAT S     Performed at [facility]   Report Status 10/10/2013 FINAL   Final  GRAM STAIN     Status: None   Collection Time    10/06/13 12:45 PM      Result Value Ref   Range Status   Specimen Description CSF   Final   Special Requests NONE   Final   Gram Stain     Final   Value: NO ORGANISMS SEEN     WBC PRESENT, PREDOMINANTLY MONONUCLEAR     CYTOSPIN     Gram Stain Report Called to,Read Back By and Verified With: JOHNSON,R AT 1500 ON [id number] BY POTEAT,S   Report Status 10/06/2013 FINAL   Final  CULTURE, BLOOD (ROUTINE X 2)     Status: None   Collection Time    10/07/13  9:20 AM      Result Value Ref Range Status   Specimen Description BLOOD RIGHT ARM   Final   Special Requests BOTTLES DRAWN AEROBIC AND ANAEROBIC 10CC   Final   Culture  Setup Time     Final   Value: 10/07/2013 12:53     Performed at [facility]   Culture     Final   Value:         BLOOD CULTURE RECEIVED NO GROWTH TO DATE CULTURE WILL BE HELD FOR 5 DAYS BEFORE ISSUING A FINAL NEGATIVE REPORT     Performed at [facility]   Report Status PENDING   Incomplete  CULTURE, BLOOD (ROUTINE X 2)     Status: None   Collection Time    10/07/13 11:10 AM      Result Value Ref Range Status   Specimen Description BLOOD RIGHT ANTECUBITAL   Final   Special Requests BOTTLES DRAWN AEROBIC AND ANAEROBIC 5CC   Final   Culture  Setup Time     Final   Value: 10/07/2013 14:23     Performed at [facility]   Culture     Final   Value:        BLOOD CULTURE RECEIVED NO GROWTH TO DATE CULTURE WILL BE HELD FOR 5 DAYS BEFORE ISSUING A FINAL NEGATIVE REPORT     Performed at [facility]   Report Status PENDING   Incomplete     Labs: Basic Metabolic Panel:  Recent Labs Lab 10/04/13 1507 10/05/13 0136 10/06/13 0900 10/07/13 0320 10/09/13 0514  NA 136* 138 138 139 143  K 3.7 3.9 3.4* 3.6* 3.6*  CL 98 103 99 100 100  CO2 24 24 26 29 32  GLUCOSE 162* 122* 160* 178* 163*  BUN 26* 18 8 9 8  CREATININE 1.44* 1.02 0.81 0.94 0.99  CALCIUM 8.3* 7.6* 8.4 8.3* 8.7   Liver Function Tests:  Recent Labs Lab 10/04/13 1507 10/05/13 0136 10/06/13 0900 10/07/13 0320  AST 39* 43* 39* 46*  ALT 32 36* 39* 47*  ALKPHOS 91 90 108 103  BILITOT 0.6 0.5 0.7 0.5  PROT 6.5 6.2 6.7 6.1  ALBUMIN 3.1* 2.9* 2.9* 2.7*    Recent Labs Lab 10/04/13 1507  LIPASE 11   No results found for this basename: AMMONIA,  in the last 168 hours CBC:  Recent Labs Lab 10/05/13 0136 10/05/13 0805 10/06/13 0900 10/07/13 0320 10/08/13 0320 10/09/13 0514  WBC 5.3  --  5.9 7.1 6.8 8.2  NEUTROABS  --   --  4.4  --   --   --   HGB 10.8*  --  11.2* 10.5* 10.3* 10.9*  HCT 32.9*  --  32.5* 30.4* 30.6* 31.9*  MCV 86.6  --  84.2 83.5 84.1 83.5  PLT 75* 81* 104* 122* 144* 216   Cardiac Enzymes:  Recent Labs Lab 10/04/13 2000 10/05/13 0136   10/05/13 0805 10/05/13 1501 10/05/13 1950   TROPONINI <0.30 <0.30 0.62* <0.30 0.34*   BNP: BNP (last 3 results) No results found for this basename: PROBNP,  in the last 8760 hours CBG: No results found for this basename: GLUCAP,  in the last 168 hours     Signed:  [facility] 10/11/2013, 12:46 PM

## 2013-10-12 ENCOUNTER — Other Ambulatory Visit: Payer: Self-pay | Admitting: Cardiology

## 2013-10-12 LAB — CULTURE, BLOOD (ROUTINE X 2)
Culture: NO GROWTH
Culture: NO GROWTH

## 2013-10-13 LAB — CULTURE, BLOOD (ROUTINE X 2)
Culture: NO GROWTH
Culture: NO GROWTH

## 2013-10-15 ENCOUNTER — Ambulatory Visit (INDEPENDENT_AMBULATORY_CARE_PROVIDER_SITE_OTHER): Payer: Medicare Other | Admitting: Internal Medicine

## 2013-10-15 ENCOUNTER — Telehealth: Payer: Self-pay | Admitting: *Deleted

## 2013-10-15 ENCOUNTER — Encounter: Payer: Self-pay | Admitting: Internal Medicine

## 2013-10-15 VITALS — BP 108/64 | HR 90 | Temp 98.5°F | Ht 68.5 in | Wt 226.2 lb

## 2013-10-15 DIAGNOSIS — Z09 Encounter for follow-up examination after completed treatment for conditions other than malignant neoplasm: Secondary | ICD-10-CM | POA: Diagnosis not present

## 2013-10-15 DIAGNOSIS — A419 Sepsis, unspecified organism: Secondary | ICD-10-CM

## 2013-10-15 DIAGNOSIS — I2510 Atherosclerotic heart disease of native coronary artery without angina pectoris: Secondary | ICD-10-CM

## 2013-10-15 NOTE — Progress Notes (Signed)
Pre visit review using our clinic review tool, if applicable. No additional management support is needed unless otherwise documented below in the visit note. 

## 2013-10-15 NOTE — Patient Instructions (Addendum)
Sepsis, Adult Sepsis is a serious infection of your blood or tissues that affects your whole body. The infection that causes sepsis may be bacterial, viral, fungal, or parasitic. Sepsis may be life threatening. Sepsis can cause your blood pressure to drop. This may result in shock. Shock causes your central nervous system and your organs to stop working correctly.  RISK FACTORS Sepsis can happen in anyone, but it is more likely to happen in people who have weakened immune systems. SIGNS AND SYMPTOMS  Symptoms of sepsis can include:  Fever or low body temperature (hypothermia).  Rapid breathing (hyperventilation).  Chills.  Rapid heartbeat (tachycardia).  Confusion or lightheadedness.  Trouble breathing.  Urinating much less than usual.  Cool, clammy skin or red, flushed skin.  Other problems with the heart, kidneys, or brain. DIAGNOSIS  Your health care provider will likely do tests to look for an infection, to see if the infection has spread to your blood, and to see how serious your condition is. Tests can include:  Blood tests, including cultures of your blood.  Cultures of other fluids from your body, such as:  Urine.  Pus from wounds.  Mucus coughed up from your lungs.  Urine tests other than cultures.  X-ray exams or other imaging tests. TREATMENT  Treatment will begin with elimination of the source of infection. If your sepsis is likely caused by a bacterial or fungal infection, you will be given antibiotic or antifungal medicines. You may also receive:  Oxygen.  Fluids through an IV tube.  Medicines to increase your blood pressure.  A machine to clean your blood (dialysis) if your kidneys fail.  A machine to help you breathe if your lungs fail. SEEK IMMEDIATE MEDICAL CARE IF: You get an infection or develop any of the signs and symptoms of sepsis after surgery or a hospitalization. Document Released: 03/17/2003 Document Revised: 04/08/2013 Document  Reviewed: 02/23/2013 [facility] Patient Information 2014 [place].

## 2013-10-15 NOTE — Progress Notes (Signed)
Subjective:    Patient ID: Melinda [name], female    DOB: [birth year], 58 y.o.   MRN: [id number]  HPI  Pt presents to the clinic today for hospital followup. She is a new patient to me. She went to the ER 10/04/13, for fever, nausea, HAs, and sore throat. She presented hypotensive and septic. Her blood cultures were + for Microaerophilic strep bacteremia. Pt was discharged home on 10/11/13 on PO Augmentin to finish out a 2 week abx course after 2D echo negative for endocarditis and 2 negative sets of blood cultures. Lumbar puncture was negative. Pt was also found to be + for rhinovirus. Pt stated that the site of infection of Strep was never determined. Today, pt states that she is feeling much better everyday with good appetite but still feels fatigued and mildly dizzy upon standing or bending over. Pt states that she has had some diarrhea but without excessive bowel movements. Denies: HA, cough, nausea/vomiting, constipation, CP, SOB, and calf pain.   Review of Systems  Past Medical History  Diagnosis Date  . Degenerative disc disease   . Spinal stenosis   . Facet joint disease   . Bulging disc   . MRSA (methicillin resistant staph aureus) culture positive 2011    GREAT TOE RIGHT FOOT  . Fibromyalgia   . Peripheral neuropathy   . MCL deficiency, knee   . Coronary arteritis   . CAD (coronary artery disease) 2009    s/p stent to LAD    Current Outpatient Prescriptions  Medication Sig Dispense Refill  . amoxicillin-clavulanate (AUGMENTIN) 875-125 MG per tablet Take 1 tablet by mouth 2 (two) times daily. For 6 days  12 tablet  0  . aspirin 81 MG tablet Take 81 mg by mouth daily.        Marland Kitchen BACLOFEN PO Take 65.87 mcg by mouth daily. Continuous infusion with morphine pump.      Marland Kitchen buPROPion (WELLBUTRIN XL) 150 MG 24 hr tablet Take 150 mg by mouth daily.      . isosorbide dinitrate (ISORDIL) 30 MG tablet Take 30 mg by mouth daily.      . Multiple Vitamin (MULTIVITAMIN WITH MINERALS) TABS  tablet Take 1 tablet by mouth daily.      . NON FORMULARY 8.587 mg by Intravenous (Continuous Infusion) route daily. MORPHINE PUMP      . oseltamivir (TAMIFLU) 75 MG capsule Take 75 mg by mouth 2 (two) times daily. For 5 days.      Marland Kitchen oxyCODONE (OXY IR/ROXICODONE) 5 MG immediate release tablet Take 5 mg by mouth 3 (three) times daily as needed for severe pain (1 to 3 times daily as needed for pain.).      Marland Kitchen PARoxetine (PAXIL) 20 MG tablet Take 10 mg by mouth daily. Take 0.5 tablet by mouth twice daily      . pregabalin (LYRICA) 75 MG capsule Take 75 mg by mouth daily.       . rosuvastatin (CRESTOR) 20 MG tablet Take 1 tablet (20 mg total) by mouth daily.  30 tablet  5  . tiZANidine (ZANAFLEX) 4 MG tablet Take 4 mg by mouth 3 (three) times daily with meals as needed for muscle spasms (2 to 3 times daily.).       No current facility-administered medications for this visit.    No Known Allergies  No family history on file.  History   Social History  . Marital Status: Single    Spouse Name: N/A  Number of Children: N/A  . Years of Education: N/A   Occupational History  . Not on file.   Social History Main Topics  . Smoking status: Former Research scientist (life sciences)  . Smokeless tobacco: Not on file  . Alcohol Use: No  . Drug Use: No  . Sexual Activity: Not on file   Other Topics Concern  . Not on file   Social History Narrative  . No narrative on file   Constitutional: Pt reports fatigue. Denies fever, malaise,  headache or abrupt weight changes.  HEENT: Denies eye pain, eye redness, ear pain, ringing in the ears, wax buildup, runny nose, nasal congestion, bloody nose, or sore throat. Respiratory: Denies difficulty breathing, shortness of breath, cough or sputum production.   Cardiovascular: Denies chest pain, chest tightness, palpitations or swelling in the hands or feet.  Gastrointestinal: Pt reports diarrhea. Denies abdominal pain, bloating, constipation, or blood in the stool.  GU: Denies  urgency, frequency, pain with urination, burning sensation, blood in urine, odor or discharge. Musculoskeletal: Denies decrease in range of motion, difficulty with gait, muscle pain or joint pain and swelling.  Skin: Denies redness, rashes, lesions or ulcercations.  Neurological: Pt reports mild dizziness particularly after standing up. Denies difficulty with memory, difficulty with speech or problems with balance and coordination.   No other specific complaints in a complete review of systems (except as listed in HPI above).  Objective:   Physical Exam   BP 108/64  Pulse 90  Temp(Src) 98.5 F (36.9 C) (Oral)  Ht 5' 8.5" (1.74 m)  Wt 226 lb 4 oz (102.626 kg)  BMI 33.90 kg/m2  SpO2 97% Wt Readings from Last 3 Encounters:  10/15/13 226 lb 4 oz (102.626 kg)  10/05/13 239 lb 3.2 oz (108.5 kg)  08/04/13 230 lb (104.327 kg)    General: Appears her stated age in NAD. Skin: Warm, dry and intact. No rashes, lesions or ulcerations noted. HEENT: Head: normal shape and size; Eyes: sclera white, no icterus, conjunctiva pink, PERRLA and EOMs intact; Ears: Tm's gray and intact, normal light reflex; Nose: mucosa pink and moist, septum midline; Throat/Mouth: Teeth present, mucosa pink and moist, no exudate, lesions or ulcerations noted.  Neck: Normal range of motion. Neck supple, trachea midline. No massses, lumps or thyromegaly present.  Cardiovascular: Normal rate and rhythm. S1,S2 noted.  No murmur, rubs or gallops noted. No JVD.No carotid bruits noted. Extremities: Warm; no edema noted B/L.  Pulmonary/Chest: Normal effort and positive vesicular breath sounds. No respiratory distress. No wheezes, rales or ronchi noted.  Neurological: Alert and oriented.  Psychiatric: Mood and affect normal. Behavior is normal. Judgment and thought content normal.   BMET    Component Value Date/Time   NA 143 10/09/2013 0514   K 3.6* 10/09/2013 0514   CL 100 10/09/2013 0514   CO2 32 10/09/2013 0514   GLUCOSE  163* 10/09/2013 0514   BUN 8 10/09/2013 0514   CREATININE 0.99 10/09/2013 0514   CALCIUM 8.7 10/09/2013 0514   GFRNONAA 63* 10/09/2013 0514   GFRAA 72* 10/09/2013 0514    Lipid Panel  No results found for this basename: chol, trig, hdl, cholhdl, vldl, ldlcalc    CBC    Component Value Date/Time   WBC 8.2 10/09/2013 0514   RBC 3.82* 10/09/2013 0514   RBC 3.47* 10/05/2013 0805   HGB 10.9* 10/09/2013 0514   HCT 31.9* 10/09/2013 0514   PLT 216 10/09/2013 0514   MCV 83.5 10/09/2013 0514   MCH 28.5 10/09/2013 0514  MCHC 34.2 10/09/2013 0514   RDW 13.5 10/09/2013 0514   LYMPHSABS 0.8 10/06/2013 0900   MONOABS 0.8 10/06/2013 0900   EOSABS 0.0 10/06/2013 0900   BASOSABS 0.0 10/06/2013 0900    Hgb A1C Lab Results  Component Value Date   HGBA1C 6.5* 10/04/2013    Assessment & Plan:   F/U after Hospitilzation for Sepsis:   Hospital notes, labs, procedures and discharge summary reviewed. Time to [facility] all notes approx 20 minutes. Instructed the patient to call if notice any excessive dizziness, fever, hypotension (monitoring with home monitor), excessive diarrhea, or yeast infection.   Instructed pt to finish full abx course.  RTC in order to establish care

## 2013-10-15 NOTE — Telephone Encounter (Signed)
Patient called for refills of isosorbide and crestor. She is out and would like them sent to [facility] in [place]. Thanks, MI

## 2013-10-16 MED ORDER — ISOSORBIDE DINITRATE 30 MG PO TABS: 30.0000 mg | ORAL_TABLET | Freq: Every day | ORAL | Status: DC

## 2013-10-16 MED ORDER — ROSUVASTATIN CALCIUM 20 MG PO TABS
20.0000 mg | ORAL_TABLET | Freq: Every day | ORAL | Status: DC
Start: 2013-10-16 — End: 2014-05-05

## 2013-10-20 ENCOUNTER — Encounter: Payer: Self-pay | Admitting: Internal Medicine

## 2013-10-20 ENCOUNTER — Ambulatory Visit (INDEPENDENT_AMBULATORY_CARE_PROVIDER_SITE_OTHER): Payer: Medicare Other | Admitting: Internal Medicine

## 2013-10-20 VITALS — BP 130/82 | HR 86 | Temp 98.4°F | Wt 226.0 lb

## 2013-10-20 DIAGNOSIS — F341 Dysthymic disorder: Secondary | ICD-10-CM | POA: Diagnosis not present

## 2013-10-20 DIAGNOSIS — G589 Mononeuropathy, unspecified: Secondary | ICD-10-CM | POA: Diagnosis not present

## 2013-10-20 DIAGNOSIS — G894 Chronic pain syndrome: Secondary | ICD-10-CM | POA: Diagnosis not present

## 2013-10-20 DIAGNOSIS — I2510 Atherosclerotic heart disease of native coronary artery without angina pectoris: Secondary | ICD-10-CM

## 2013-10-20 DIAGNOSIS — F329 Major depressive disorder, single episode, unspecified: Secondary | ICD-10-CM | POA: Insufficient documentation

## 2013-10-20 DIAGNOSIS — E669 Obesity, unspecified: Secondary | ICD-10-CM

## 2013-10-20 DIAGNOSIS — F419 Anxiety disorder, unspecified: Secondary | ICD-10-CM | POA: Insufficient documentation

## 2013-10-20 DIAGNOSIS — F32A Depression, unspecified: Secondary | ICD-10-CM | POA: Insufficient documentation

## 2013-10-20 MED ORDER — PAROXETINE HCL 20 MG PO TABS: 10.0000 mg | ORAL_TABLET | Freq: Every day | ORAL | Status: DC

## 2013-10-20 MED ORDER — BUPROPION HCL ER (XL) 150 MG PO TB24: 150.0000 mg | ORAL_TABLET | Freq: Every day | ORAL | Status: DC

## 2013-10-20 NOTE — Assessment & Plan Note (Signed)
Managed by pain management ?

## 2013-10-20 NOTE — Assessment & Plan Note (Signed)
Lost 10 lbs recently Working on diet and exercise

## 2013-10-20 NOTE — Progress Notes (Signed)
HPI  Pt presents to the clinic today to establish care. She is transferring care from a family practice in [place], [place]. She has no concerns today.  Flu: never Tetanus: 2009 Pap Smear: about 5 years ago Mammogram: 2009 Colonoscopy: 2011 Eye Doctor: yearly Dentist: as needed  Past Medical History  Diagnosis Date  . Degenerative disc disease   . Spinal stenosis   . Facet joint disease   . Bulging disc   . MRSA (methicillin resistant staph aureus) culture positive 2011    GREAT TOE RIGHT FOOT  . Fibromyalgia   . Peripheral neuropathy   . MCL deficiency, knee   . Coronary arteritis   . CAD (coronary artery disease) 2009    s/p stent to LAD  . Hyperlipidemia   . Heart disease   . 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 Chicken pox     Current Outpatient Prescriptions  Medication Sig Dispense Refill  . aspirin 81 MG tablet Take 81 mg by mouth daily.        Marland Kitchen BACLOFEN PO Take 65.87 mcg by mouth daily. Continuous infusion with morphine pump.      Marland Kitchen buPROPion (WELLBUTRIN XL) 150 MG 24 hr tablet Take 150 mg by mouth daily.      . isosorbide dinitrate (ISORDIL) 30 MG tablet Take 1 tablet (30 mg total) by mouth daily.  30 tablet  6  . Multiple Vitamin (MULTIVITAMIN WITH MINERALS) TABS tablet Take 1 tablet by mouth daily.      . NON FORMULARY 8.587 mg by Intravenous (Continuous Infusion) route daily. MORPHINE PUMP      . oxyCODONE (OXY IR/ROXICODONE) 5 MG immediate release tablet Take 5 mg by mouth 3 (three) times daily as needed for severe pain (1 to 3 times daily as needed for pain.).      Marland Kitchen PARoxetine (PAXIL) 20 MG tablet Take 10 mg by mouth daily. Take 0.5 tablet by mouth twice daily      . polyethylene glycol (MIRALAX / GLYCOLAX) packet Take 17 g by mouth daily.      . pregabalin (LYRICA) 75 MG capsule Take 75 mg by mouth daily.       . rosuvastatin (CRESTOR) 20 MG tablet Take 1 tablet (20 mg total) by mouth daily.  30 tablet  6  . tiZANidine (ZANAFLEX) 4 MG  tablet Take 4 mg by mouth 3 (three) times daily with meals as needed for muscle spasms (2 to 3 times daily.).       No current facility-administered medications for this visit.    No Known Allergies  Family History  Problem Relation Age of Onset  . Cancer Mother     lung  . Diabetes Father   . Heart disease Maternal Grandfather   . Diabetes Paternal Grandmother   . Cancer Paternal Grandfather     colon  . Stroke Neg Hx     History   Social History  . Marital Status: Single    Spouse Name: N/A    Number of Children: N/A  . Years of Education: N/A   Occupational History  . Not on file.   Social History Main Topics  . Smoking status: Former Research scientist (life sciences)  . Smokeless tobacco: Not on file  . Alcohol Use: No  . Drug Use: No  . Sexual Activity: Yes   Other Topics Concern  . Not on file   Social History Narrative  . No narrative on file    ROS:  Constitutional:  Denies fever, malaise, fatigue, headache or abrupt weight changes.  Respiratory: Denies difficulty breathing, shortness of breath, cough or sputum production.   Cardiovascular: Denies chest pain, chest tightness, palpitations or swelling in the hands or feet.  Psych: Pt reports anxiety and depression, denies SI/HI.  No other specific complaints in a complete review of systems (except as listed in HPI above).  PE:  BP 130/82  Pulse 86  Temp(Src) 98.4 F (36.9 C) (Oral)  Wt 226 lb (102.513 kg)  SpO2 97% Wt Readings from Last 3 Encounters:  10/20/13 226 lb (102.513 kg)  10/15/13 226 lb 4 oz (102.626 kg)  10/05/13 239 lb 3.2 oz (108.5 kg)    General: Appears her stated age, obese but well developed, well nourished in NAD. Cardiovascular: Normal rate and rhythm. S1,S2 noted.  No murmur, rubs or gallops noted. No JVD or BLE edema. No carotid bruits noted. Pulmonary/Chest: Normal effort and positive vesicular breath sounds. No respiratory distress. No wheezes, rales or ronchi noted.  Psychiatric: Mood and  affect normal. Behavior is normal. Judgment and thought content normal.     BMET    Component Value Date/Time   NA 143 10/09/2013 0514   K 3.6* 10/09/2013 0514   CL 100 10/09/2013 0514   CO2 32 10/09/2013 0514   GLUCOSE 163* 10/09/2013 0514   BUN 8 10/09/2013 0514   CREATININE 0.99 10/09/2013 0514   CALCIUM 8.7 10/09/2013 0514   GFRNONAA 63* 10/09/2013 0514   GFRAA 72* 10/09/2013 0514    Lipid Panel  No results found for this basename: chol, trig, hdl, cholhdl, vldl, ldlcalc    CBC    Component Value Date/Time   WBC 8.2 10/09/2013 0514   RBC 3.82* 10/09/2013 0514   RBC 3.47* 10/05/2013 0805   HGB 10.9* 10/09/2013 0514   HCT 31.9* 10/09/2013 0514   PLT 216 10/09/2013 0514   MCV 83.5 10/09/2013 0514   MCH 28.5 10/09/2013 0514   MCHC 34.2 10/09/2013 0514   RDW 13.5 10/09/2013 0514   LYMPHSABS 0.8 10/06/2013 0900   MONOABS 0.8 10/06/2013 0900   EOSABS 0.0 10/06/2013 0900   BASOSABS 0.0 10/06/2013 0900    Hgb A1C Lab Results  Component Value Date   HGBA1C 6.5* 10/04/2013     Assessment and Plan:

## 2013-10-20 NOTE — Assessment & Plan Note (Signed)
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

## 2013-10-20 NOTE — Patient Instructions (Addendum)

## 2013-10-20 NOTE — Progress Notes (Signed)
Pre visit review using our clinic review tool, if applicable. No additional management support is needed unless otherwise documented below in the visit note. 

## 2013-10-20 NOTE — Assessment & Plan Note (Signed)
Well controlled on paxil and wellbutrin Will refill today

## 2013-10-30 ENCOUNTER — Ambulatory Visit: Payer: Self-pay | Admitting: Pain Medicine

## 2013-10-30 DIAGNOSIS — Z79899 Other long term (current) drug therapy: Secondary | ICD-10-CM | POA: Diagnosis not present

## 2013-10-30 DIAGNOSIS — IMO0002 Reserved for concepts with insufficient information to code with codable children: Secondary | ICD-10-CM | POA: Diagnosis not present

## 2013-10-30 DIAGNOSIS — F329 Major depressive disorder, single episode, unspecified: Secondary | ICD-10-CM | POA: Diagnosis not present

## 2013-10-30 DIAGNOSIS — M5137 Other intervertebral disc degeneration, lumbosacral region: Secondary | ICD-10-CM | POA: Diagnosis not present

## 2013-10-30 DIAGNOSIS — F3289 Other specified depressive episodes: Secondary | ICD-10-CM | POA: Diagnosis not present

## 2013-10-30 DIAGNOSIS — G8929 Other chronic pain: Secondary | ICD-10-CM | POA: Diagnosis not present

## 2013-10-30 DIAGNOSIS — M79609 Pain in unspecified limb: Secondary | ICD-10-CM | POA: Diagnosis not present

## 2013-10-30 DIAGNOSIS — M545 Low back pain, unspecified: Secondary | ICD-10-CM | POA: Diagnosis not present

## 2013-10-30 DIAGNOSIS — M5126 Other intervertebral disc displacement, lumbar region: Secondary | ICD-10-CM | POA: Diagnosis not present

## 2013-10-30 DIAGNOSIS — R5382 Chronic fatigue, unspecified: Secondary | ICD-10-CM | POA: Diagnosis not present

## 2013-10-30 DIAGNOSIS — M47817 Spondylosis without myelopathy or radiculopathy, lumbosacral region: Secondary | ICD-10-CM | POA: Diagnosis not present

## 2013-10-30 DIAGNOSIS — M1610 Unilateral primary osteoarthritis, unspecified hip: Secondary | ICD-10-CM | POA: Diagnosis not present

## 2013-10-30 DIAGNOSIS — M4716 Other spondylosis with myelopathy, lumbar region: Secondary | ICD-10-CM | POA: Diagnosis not present

## 2013-10-30 DIAGNOSIS — G9332 Myalgic encephalomyelitis/chronic fatigue syndrome: Secondary | ICD-10-CM | POA: Diagnosis not present

## 2013-10-30 DIAGNOSIS — IMO0001 Reserved for inherently not codable concepts without codable children: Secondary | ICD-10-CM | POA: Diagnosis not present

## 2013-10-30 DIAGNOSIS — M47812 Spondylosis without myelopathy or radiculopathy, cervical region: Secondary | ICD-10-CM | POA: Diagnosis not present

## 2013-10-30 DIAGNOSIS — M169 Osteoarthritis of hip, unspecified: Secondary | ICD-10-CM | POA: Diagnosis not present

## 2013-10-30 DIAGNOSIS — M5030 Other cervical disc degeneration, unspecified cervical region: Secondary | ICD-10-CM | POA: Diagnosis not present

## 2013-11-17 ENCOUNTER — Encounter: Payer: Self-pay | Admitting: Cardiology

## 2014-01-11 ENCOUNTER — Encounter: Payer: Self-pay | Admitting: Cardiology

## 2014-01-14 ENCOUNTER — Ambulatory Visit: Payer: Self-pay | Admitting: Pain Medicine

## 2014-01-14 DIAGNOSIS — M169 Osteoarthritis of hip, unspecified: Secondary | ICD-10-CM | POA: Diagnosis not present

## 2014-01-14 DIAGNOSIS — M5030 Other cervical disc degeneration, unspecified cervical region: Secondary | ICD-10-CM | POA: Diagnosis not present

## 2014-01-14 DIAGNOSIS — G8929 Other chronic pain: Secondary | ICD-10-CM | POA: Diagnosis not present

## 2014-01-14 DIAGNOSIS — M5137 Other intervertebral disc degeneration, lumbosacral region: Secondary | ICD-10-CM | POA: Diagnosis not present

## 2014-01-14 DIAGNOSIS — M5126 Other intervertebral disc displacement, lumbar region: Secondary | ICD-10-CM | POA: Diagnosis not present

## 2014-01-14 DIAGNOSIS — F3289 Other specified depressive episodes: Secondary | ICD-10-CM | POA: Diagnosis not present

## 2014-01-14 DIAGNOSIS — G609 Hereditary and idiopathic neuropathy, unspecified: Secondary | ICD-10-CM | POA: Diagnosis not present

## 2014-01-14 DIAGNOSIS — IMO0002 Reserved for concepts with insufficient information to code with codable children: Secondary | ICD-10-CM | POA: Diagnosis not present

## 2014-01-14 DIAGNOSIS — M79609 Pain in unspecified limb: Secondary | ICD-10-CM | POA: Diagnosis not present

## 2014-01-14 DIAGNOSIS — IMO0001 Reserved for inherently not codable concepts without codable children: Secondary | ICD-10-CM | POA: Diagnosis not present

## 2014-01-14 DIAGNOSIS — Z79899 Other long term (current) drug therapy: Secondary | ICD-10-CM | POA: Diagnosis not present

## 2014-01-14 DIAGNOSIS — F329 Major depressive disorder, single episode, unspecified: Secondary | ICD-10-CM | POA: Diagnosis not present

## 2014-01-14 DIAGNOSIS — M1610 Unilateral primary osteoarthritis, unspecified hip: Secondary | ICD-10-CM | POA: Diagnosis not present

## 2014-01-14 DIAGNOSIS — M4716 Other spondylosis with myelopathy, lumbar region: Secondary | ICD-10-CM | POA: Diagnosis not present

## 2014-01-14 DIAGNOSIS — M47812 Spondylosis without myelopathy or radiculopathy, cervical region: Secondary | ICD-10-CM | POA: Diagnosis not present

## 2014-01-14 DIAGNOSIS — M545 Low back pain, unspecified: Secondary | ICD-10-CM | POA: Diagnosis not present

## 2014-01-14 DIAGNOSIS — M47817 Spondylosis without myelopathy or radiculopathy, lumbosacral region: Secondary | ICD-10-CM | POA: Diagnosis not present

## 2014-01-14 DIAGNOSIS — R5382 Chronic fatigue, unspecified: Secondary | ICD-10-CM | POA: Diagnosis not present

## 2014-01-14 DIAGNOSIS — G9332 Myalgic encephalomyelitis/chronic fatigue syndrome: Secondary | ICD-10-CM | POA: Diagnosis not present

## 2014-01-15 ENCOUNTER — Encounter: Payer: Self-pay | Admitting: Internal Medicine

## 2014-01-15 ENCOUNTER — Ambulatory Visit (INDEPENDENT_AMBULATORY_CARE_PROVIDER_SITE_OTHER): Payer: Medicare Other | Admitting: Internal Medicine

## 2014-01-15 VITALS — BP 112/78 | HR 82 | Temp 98.4°F | Wt 231.0 lb

## 2014-01-15 DIAGNOSIS — I2510 Atherosclerotic heart disease of native coronary artery without angina pectoris: Secondary | ICD-10-CM

## 2014-01-15 DIAGNOSIS — R7309 Other abnormal glucose: Secondary | ICD-10-CM

## 2014-01-15 DIAGNOSIS — R7303 Prediabetes: Secondary | ICD-10-CM

## 2014-01-15 LAB — HEMOGLOBIN A1C: Hgb A1c MFr Bld: 6.9 % — ABNORMAL HIGH (ref 4.6–6.5)

## 2014-01-15 NOTE — Progress Notes (Signed)
Subjective:    Patient ID: Melinda [name], female    DOB: [birth year], 58 y.o.   MRN: [id number]  HPI  Pt presents to the clinic today for 3 month follow up of A1C. 3 months ago, while she was in the hospital, her A1C was 6.5% At that time, she was very sick. She has never been told that she has diabetes although she does have a family history of it. She denies polyuria, polydipsia, but does reports intermittent fatigue. She would like to know if in fact she does have diabetes today and if she needs treatment.   Review of Systems      Past Medical History  Diagnosis Date  . Degenerative disc disease   . Spinal stenosis   . Facet joint disease   . Bulging disc   . MRSA (methicillin resistant staph aureus) culture positive 2011    GREAT TOE RIGHT FOOT  . Fibromyalgia   . Peripheral neuropathy   . MCL deficiency, knee   . Coronary arteritis   . CAD (coronary artery disease) 2009    s/p stent to LAD  . Hyperlipidemia   . Heart disease   . 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 Chicken pox     Current Outpatient Prescriptions  Medication Sig Dispense Refill  . aspirin 81 MG tablet Take 81 mg by mouth daily.        Marland Kitchen BACLOFEN PO 65.87 mcg by Intrathecal route daily. Continuous infusion with morphine pump.      Marland Kitchen buPROPion (WELLBUTRIN XL) 150 MG 24 hr tablet Take 1 tablet (150 mg total) by mouth daily.  90 tablet  3  . isosorbide dinitrate (ISORDIL) 30 MG tablet Take 1 tablet (30 mg total) by mouth daily.  30 tablet  6  . Multiple Vitamin (MULTIVITAMIN WITH MINERALS) TABS tablet Take 1 tablet by mouth daily.      . NON FORMULARY 8.587 mg by Intravenous (Continuous Infusion) route daily. MORPHINE PUMP      . oxyCODONE (OXY IR/ROXICODONE) 5 MG immediate release tablet Take 5 mg by mouth 3 (three) times daily as needed for severe pain (1 to 3 times daily as needed for pain.).      Marland Kitchen PARoxetine (PAXIL) 20 MG tablet Take 0.5 tablets (10 mg total) by mouth daily. Take  0.5 tablet by mouth twice daily  90 tablet  3  . polyethylene glycol (MIRALAX / GLYCOLAX) packet Take 17 g by mouth daily.      . pregabalin (LYRICA) 75 MG capsule Take 75 mg by mouth daily.       . rosuvastatin (CRESTOR) 20 MG tablet Take 1 tablet (20 mg total) by mouth daily.  30 tablet  6  . tiZANidine (ZANAFLEX) 4 MG tablet Take 4 mg by mouth 3 (three) times daily with meals as needed for muscle spasms (2 to 3 times daily.).       No current facility-administered medications for this visit.    No Known Allergies  Family History  Problem Relation Age of Onset  . Cancer Mother     lung  . Diabetes Father   . Heart disease Maternal Grandfather   . Diabetes Paternal Grandmother   . Cancer Paternal Grandfather     colon  . Stroke Neg Hx     History   Social History  . Marital Status: Single    Spouse Name: N/A    Number of Children: N/A  . Years of Education:  N/A   Occupational History  . Not on file.   Social History Main Topics  . Smoking status: Former Research scientist (life sciences)  . Smokeless tobacco: Not on file  . Alcohol Use: No  . Drug Use: No  . Sexual Activity: Yes   Other Topics Concern  . Not on file   Social History Narrative  . No narrative on file     Constitutional: Pt reports fatigue. Denies fever, malaise, headache or abrupt weight changes.  Respiratory: Denies difficulty breathing, shortness of breath, cough or sputum production.   Cardiovascular: Denies chest pain, chest tightness, palpitations or swelling in the hands or feet.  Skin: Denies redness, rashes, lesions or ulcercations.  Neurological: Denies dizziness, difficulty with memory, difficulty with speech or problems with balance and coordination.   No other specific complaints in a complete review of systems (except as listed in HPI above).  Objective:   Physical Exam   BP 112/78  Pulse 82  Temp(Src) 98.4 F (36.9 C) (Oral)  Wt 231 lb (104.781 kg) Wt Readings from Last 3 Encounters:  01/15/14 231  lb (104.781 kg)  10/20/13 226 lb (102.513 kg)  10/15/13 226 lb 4 oz (102.626 kg)    General: Appears her stated age, well developed, well nourished in NAD. Skin: Warm, dry and intact. No rashes, lesions or ulcerations noted. Cardiovascular: Normal rate and rhythm. S1,S2 noted.  No murmur, rubs or gallops noted. No JVD or BLE edema. No carotid bruits noted. Pulmonary/Chest: Normal effort and positive vesicular breath sounds. No respiratory distress. No wheezes, rales or ronchi noted.  Neurological: Alert and oriented.  BMET    Component Value Date/Time   NA 143 10/09/2013 0514   K 3.6* 10/09/2013 0514   CL 100 10/09/2013 0514   CO2 32 10/09/2013 0514   GLUCOSE 163* 10/09/2013 0514   BUN 8 10/09/2013 0514   CREATININE 0.99 10/09/2013 0514   CALCIUM 8.7 10/09/2013 0514   GFRNONAA 63* 10/09/2013 0514   GFRAA 72* 10/09/2013 0514    Lipid Panel  No results found for this basename: chol, trig, hdl, cholhdl, vldl, ldlcalc    CBC    Component Value Date/Time   WBC 8.2 10/09/2013 0514   RBC 3.82* 10/09/2013 0514   RBC 3.47* 10/05/2013 0805   HGB 10.9* 10/09/2013 0514   HCT 31.9* 10/09/2013 0514   PLT 216 10/09/2013 0514   MCV 83.5 10/09/2013 0514   MCH 28.5 10/09/2013 0514   MCHC 34.2 10/09/2013 0514   RDW 13.5 10/09/2013 0514   LYMPHSABS 0.8 10/06/2013 0900   MONOABS 0.8 10/06/2013 0900   EOSABS 0.0 10/06/2013 0900   BASOSABS 0.0 10/06/2013 0900    Hgb A1C Lab Results  Component Value Date   HGBA1C 6.5* 10/04/2013        Assessment & Plan:   Elevated A1C:  Will repeat A1C today If elevated, discussed starting Metformin  Will follow up after labs are back

## 2014-01-15 NOTE — Progress Notes (Signed)
Pre visit review using our clinic review tool, if applicable. No additional management support is needed unless otherwise documented below in the visit note. 

## 2014-01-15 NOTE — Patient Instructions (Addendum)

## 2014-01-18 ENCOUNTER — Other Ambulatory Visit: Payer: Self-pay

## 2014-01-18 MED ORDER — METFORMIN HCL 500 MG PO TABS: 500.0000 mg | ORAL_TABLET | Freq: Two times a day (BID) | ORAL | Status: DC

## 2014-01-19 ENCOUNTER — Telehealth: Payer: Self-pay

## 2014-01-19 NOTE — Telephone Encounter (Signed)
Pt received result notes and after thinking thru pts lab results and instructions to follow pt wants to know if there is possibly another way could proceed without starting medication for diabetes at this time. Pt said she has been eating a lot of carbs because she did not know she needed to curb the carb intake until received call that pt was diabetic on 01/18/14. Pt has not picked up Metformin yet and wants to know rather than starting med now could pt get copy of diabetic diet that [name] NP would suggest for her to follow and should pt get a glucose meter to check BS. If so what time of day should pt ck BS. Pt would need glucose monitor, diabetic test strips and lancets sent to [facility]. Pt request cb.

## 2014-01-20 NOTE — Telephone Encounter (Signed)
She can try diabetic diet instead of metformin x 3 more months. I will recheck it at that time, but if more elevated, would need to start metformin. Please send her info on diabetic diet, basic carb counting. Ok to send in meter, strips and lancets. Advise her to check BS once daily, alternate between fasting, 2 hours after meal and right before bed.

## 2014-01-20 NOTE — Telephone Encounter (Signed)
Pt is aware--pt states she wanted to know due to new Dx of DM--would you write Rx for diabetic shoes as she has had amputation of toe--fusion ets--i will confirm what insurance will pay for and send it to pharmacy as well as log and diabetic diet handout to pt

## 2014-01-20 NOTE — Telephone Encounter (Signed)
Unfortunately, Np's can not sign RX for diabetic shoes (crazy right?) she would have to go to a podiatrist. She does not need a referral.

## 2014-01-21 MED ORDER — GLUCOSE BLOOD VI STRP: 1.0000 | ORAL_STRIP | Freq: Every day | Status: DC | PRN

## 2014-01-21 MED ORDER — ACCU-CHEK AVIVA VI SOLN: 1.0000 | Status: DC | PRN

## 2014-01-21 MED ORDER — ACCU-CHEK AVIVA PLUS W/DEVICE KIT: 1.0000 | PACK | Freq: Once | Status: DC

## 2014-01-21 MED ORDER — ACCU-CHEK SOFTCLIX LANCETS MISC: 1.0000 | Freq: Every day | Status: DC

## 2014-01-21 NOTE — Telephone Encounter (Signed)
Pt is aware as instructed 

## 2014-01-21 NOTE — Addendum Note (Signed)
Addended by: Lurlean Nanny on: 01/21/2014 01:31 PM   Modules accepted: Orders, Medications

## 2014-01-21 NOTE — Addendum Note (Signed)
Addended by: Lurlean Nanny on: 01/21/2014 01:40 PM   Modules accepted: Orders

## 2014-01-21 NOTE — Telephone Encounter (Signed)
Meter strips lancets etc have been sent to pharmacy

## 2014-01-29 ENCOUNTER — Encounter: Payer: Self-pay | Admitting: Internal Medicine

## 2014-01-29 ENCOUNTER — Ambulatory Visit (INDEPENDENT_AMBULATORY_CARE_PROVIDER_SITE_OTHER): Payer: Medicare Other | Admitting: Internal Medicine

## 2014-01-29 VITALS — BP 110/68 | HR 84 | Temp 98.2°F | Wt 226.0 lb

## 2014-01-29 DIAGNOSIS — S20161A Insect bite (nonvenomous) of breast, right breast, initial encounter: Secondary | ICD-10-CM

## 2014-01-29 DIAGNOSIS — I2510 Atherosclerotic heart disease of native coronary artery without angina pectoris: Secondary | ICD-10-CM

## 2014-01-29 DIAGNOSIS — N61 Mastitis without abscess: Secondary | ICD-10-CM | POA: Diagnosis not present

## 2014-01-29 DIAGNOSIS — S30860A Insect bite (nonvenomous) of lower back and pelvis, initial encounter: Secondary | ICD-10-CM

## 2014-01-29 DIAGNOSIS — W57XXXA Bitten or stung by nonvenomous insect and other nonvenomous arthropods, initial encounter: Principal | ICD-10-CM

## 2014-01-29 MED ORDER — SULFAMETHOXAZOLE-TMP DS 800-160 MG PO TABS: 1.0000 | ORAL_TABLET | Freq: Two times a day (BID) | ORAL | Status: DC

## 2014-01-29 NOTE — Progress Notes (Signed)
Pre visit review using our clinic review tool, if applicable. No additional management support is needed unless otherwise documented below in the visit note. 

## 2014-01-29 NOTE — Progress Notes (Signed)
Subjective:    Patient ID: Melinda [name], female    DOB: [birth year], 58 y.o.   MRN: [id number]  HPI  Pt presents to the clinic today with c/o an insect bite. This occurred yesterday. It bit her on her right breat. She thinks it may have been a horsefly. The area is now swollen and she has noticed a pocket of fluid in the area. It is tender to touch. She has tried triamcinolone cream without relief.  Review of Systems      Past Medical History  Diagnosis Date  . Degenerative disc disease   . Spinal stenosis   . Facet joint disease   . Bulging disc   . MRSA (methicillin resistant staph aureus) culture positive 2011    GREAT TOE RIGHT FOOT  . Fibromyalgia   . Peripheral neuropathy   . MCL deficiency, knee   . Coronary arteritis   . CAD (coronary artery disease) 2009    s/p stent to LAD  . Hyperlipidemia   . Heart disease   . 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 Chicken pox     Current Outpatient Prescriptions  Medication Sig Dispense Refill  . ACCU-CHEK SOFTCLIX LANCETS lancets 1-2 each by Other route daily. Use as instructed  100 each  2  . aspirin 81 MG tablet Take 81 mg by mouth daily.        Marland Kitchen BACLOFEN PO 65.87 mcg by Intrathecal route daily. Continuous infusion with morphine pump.      . Blood Glucose Calibration (ACCU-CHEK AVIVA) SOLN 1 each by In Vitro route as needed.  1 each  1  . Blood Glucose Monitoring Suppl (ACCU-CHEK AVIVA PLUS) W/DEVICE KIT 1 Device by Does not apply route once.  1 kit  0  . buPROPion (WELLBUTRIN XL) 150 MG 24 hr tablet Take 1 tablet (150 mg total) by mouth daily.  90 tablet  3  . glucose blood (ACCU-CHEK AVIVA PLUS) test strip 1-2 each by Other route daily as needed. Use as instructed  50 each  5  . isosorbide dinitrate (ISORDIL) 30 MG tablet Take 1 tablet (30 mg total) by mouth daily.  30 tablet  6  . metFORMIN (GLUCOPHAGE) 500 MG tablet Take 1 tablet (500 mg total) by mouth 2 (two) times daily with a meal.  60 tablet  2    . Multiple Vitamin (MULTIVITAMIN WITH MINERALS) TABS tablet Take 1 tablet by mouth daily.      . NON FORMULARY 8.587 mg by Intravenous (Continuous Infusion) route daily. MORPHINE PUMP      . oxyCODONE (OXY IR/ROXICODONE) 5 MG immediate release tablet Take 5 mg by mouth 3 (three) times daily as needed for severe pain (1 to 3 times daily as needed for pain.).      Marland Kitchen PARoxetine (PAXIL) 20 MG tablet Take 0.5 tablets (10 mg total) by mouth daily. Take 0.5 tablet by mouth twice daily  90 tablet  3  . polyethylene glycol (MIRALAX / GLYCOLAX) packet Take 17 g by mouth daily.      . pregabalin (LYRICA) 75 MG capsule Take 75 mg by mouth daily.       . rosuvastatin (CRESTOR) 20 MG tablet Take 1 tablet (20 mg total) by mouth daily.  30 tablet  6  . tiZANidine (ZANAFLEX) 4 MG tablet Take 4 mg by mouth 3 (three) times daily with meals as needed for muscle spasms (2 to 3 times daily.).  No current facility-administered medications for this visit.    No Known Allergies  Family History  Problem Relation Age of Onset  . Cancer Mother     lung  . Diabetes Father   . Heart disease Maternal Grandfather   . Diabetes Paternal Grandmother   . Cancer Paternal Grandfather     colon  . Stroke Neg Hx     History   Social History  . Marital Status: Single    Spouse Name: N/A    Number of Children: N/A  . Years of Education: N/A   Occupational History  . Not on file.   Social History Main Topics  . Smoking status: Former Research scientist (life sciences)  . Smokeless tobacco: Not on file  . Alcohol Use: No  . Drug Use: No  . Sexual Activity: Yes   Other Topics Concern  . Not on file   Social History Narrative  . No narrative on file     Constitutional: Denies fever, malaise, fatigue, headache or abrupt weight changes.  Skin: Pt reports insect bite on right breast, swollen and tender.     No other specific complaints in a complete review of systems (except as listed in HPI above).  Objective:   Physical  Exam   BP 110/68  Pulse 84  Temp(Src) 98.2 F (36.8 C) (Oral)  Wt 226 lb (102.513 kg)  SpO2 98% Wt Readings from Last 3 Encounters:  01/29/14 226 lb (102.513 kg)  01/15/14 231 lb (104.781 kg)  10/20/13 226 lb (102.513 kg)    General: Appears her stated age, obese but well developed, well nourished in NAD. Skin: Cellulitis and abscess noted of right breast. Pus pocket noted. Cardiovascular: Normal rate and rhythm. S1,S2 noted.  No murmur, rubs or gallops noted. No JVD or BLE edema. No carotid bruits noted. Pulmonary/Chest: Normal effort and positive vesicular breath sounds. No respiratory distress. No wheezes, rales or ronchi noted.    BMET    Component Value Date/Time   NA 143 10/09/2013 0514   K 3.6* 10/09/2013 0514   CL 100 10/09/2013 0514   CO2 32 10/09/2013 0514   GLUCOSE 163* 10/09/2013 0514   BUN 8 10/09/2013 0514   CREATININE 0.99 10/09/2013 0514   CALCIUM 8.7 10/09/2013 0514   GFRNONAA 63* 10/09/2013 0514   GFRAA 72* 10/09/2013 0514    Lipid Panel  No results found for this basename: chol, trig, hdl, cholhdl, vldl, ldlcalc    CBC    Component Value Date/Time   WBC 8.2 10/09/2013 0514   RBC 3.82* 10/09/2013 0514   RBC 3.47* 10/05/2013 0805   HGB 10.9* 10/09/2013 0514   HCT 31.9* 10/09/2013 0514   PLT 216 10/09/2013 0514   MCV 83.5 10/09/2013 0514   MCH 28.5 10/09/2013 0514   MCHC 34.2 10/09/2013 0514   RDW 13.5 10/09/2013 0514   LYMPHSABS 0.8 10/06/2013 0900   MONOABS 0.8 10/06/2013 0900   EOSABS 0.0 10/06/2013 0900   BASOSABS 0.0 10/06/2013 0900    Hgb A1C Lab Results  Component Value Date   HGBA1C 6.9* 01/15/2014        Assessment & Plan:   Cellulitis and abscess of breast secondary to insect bite:  eRx for Septra BID x 10 days Warm compresses TID to encourage it to drain Stop the triamcinolone cream Watch for streaking, increased redness, pain or fever, if so go to ER immediately

## 2014-01-29 NOTE — Patient Instructions (Addendum)

## 2014-02-01 ENCOUNTER — Ambulatory Visit: Payer: Self-pay | Admitting: Pain Medicine

## 2014-02-01 DIAGNOSIS — IMO0002 Reserved for concepts with insufficient information to code with codable children: Secondary | ICD-10-CM | POA: Diagnosis not present

## 2014-02-01 DIAGNOSIS — M5124 Other intervertebral disc displacement, thoracic region: Secondary | ICD-10-CM | POA: Diagnosis not present

## 2014-02-01 DIAGNOSIS — M129 Arthropathy, unspecified: Secondary | ICD-10-CM | POA: Diagnosis not present

## 2014-02-01 DIAGNOSIS — M519 Unspecified thoracic, thoracolumbar and lumbosacral intervertebral disc disorder: Secondary | ICD-10-CM | POA: Diagnosis not present

## 2014-02-12 ENCOUNTER — Ambulatory Visit (INDEPENDENT_AMBULATORY_CARE_PROVIDER_SITE_OTHER): Payer: Medicare Other | Admitting: Podiatry

## 2014-02-12 VITALS — BP 97/62 | HR 77 | Resp 16

## 2014-02-12 DIAGNOSIS — Q828 Other specified congenital malformations of skin: Secondary | ICD-10-CM

## 2014-02-12 DIAGNOSIS — E1159 Type 2 diabetes mellitus with other circulatory complications: Secondary | ICD-10-CM | POA: Diagnosis not present

## 2014-02-12 NOTE — Progress Notes (Signed)
Subjective:     Patient ID: Melinda [name], female   DOB: Feb 28, 1956, 58 y.o.   MRN: [id number]  HPI patient presents stating she now has diabetes and that while she is doing well with that she has no feeling in her feet and she's or he lost her big toe and she's concerned because there is keratotic lesions and cracking occurring right now of the right foot   Review of Systems     Objective:   Physical Exam Neurovascular status intact with diminishment of sharp [place] vibratory and mild diminishment of pulses with digits found to be well perfused. 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 If any drainage were to start any redness or swelling she is to reappoint immediately or go to the emergency room if it becomes a problem

## 2014-02-17 ENCOUNTER — Ambulatory Visit: Payer: Medicare Other | Admitting: Cardiology

## 2014-02-23 DIAGNOSIS — IMO0001 Reserved for inherently not codable concepts without codable children: Secondary | ICD-10-CM | POA: Diagnosis not present

## 2014-02-23 DIAGNOSIS — M542 Cervicalgia: Secondary | ICD-10-CM | POA: Diagnosis not present

## 2014-02-23 DIAGNOSIS — M9981 Other biomechanical lesions of cervical region: Secondary | ICD-10-CM | POA: Diagnosis not present

## 2014-03-02 ENCOUNTER — Encounter: Payer: Self-pay | Admitting: Cardiology

## 2014-03-02 ENCOUNTER — Ambulatory Visit (INDEPENDENT_AMBULATORY_CARE_PROVIDER_SITE_OTHER): Payer: Medicare Other | Admitting: Cardiology

## 2014-03-02 VITALS — BP 112/60 | HR 73 | Ht 69.0 in | Wt 223.0 lb

## 2014-03-02 DIAGNOSIS — E669 Obesity, unspecified: Secondary | ICD-10-CM

## 2014-03-02 DIAGNOSIS — I2510 Atherosclerotic heart disease of native coronary artery without angina pectoris: Secondary | ICD-10-CM

## 2014-03-02 DIAGNOSIS — I2089 Other forms of angina pectoris: Secondary | ICD-10-CM

## 2014-03-02 DIAGNOSIS — E1169 Type 2 diabetes mellitus with other specified complication: Secondary | ICD-10-CM | POA: Insufficient documentation

## 2014-03-02 DIAGNOSIS — I208 Other forms of angina pectoris: Secondary | ICD-10-CM

## 2014-03-02 DIAGNOSIS — E7800 Pure hypercholesterolemia, unspecified: Secondary | ICD-10-CM | POA: Diagnosis not present

## 2014-03-02 NOTE — Progress Notes (Signed)
[facility]. 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 Cardiac catheterization 2012 revealed patent LAD stent.   She underwent a nuclear stress test 2014 which demonstrated mild anterolateral ischemia with normal ejection fraction. This was a very similar finding asked to prior stress test in August of 2012 which resulted in catheterization in August of 2012 which showed patent LAD stent, DES. There was a diagonal branch jailed by stent with minor luminal irregularity at the ostium. EF was normal. Because of these symptoms, I initiated metoprolol extender release 25 mg to see if this would help.  She felt crummy. Fatigue. Unsure if it's her chronic fatigue, fibromyalgia. We decided to initiate isosorbide. She did feel some head rushing sensation she was bending over or wiping her dogs paws. Her heart rate also has not been increasing as well as usual. She also feels more fatigued.   On 10/09/12 I decided to discontinue her metoprolol 25 mg. On Nov 14, 2012 she is here for followup after discontinuation of beta blocker.  02/18/13 - Doing well. Rare palpitation off metop. Taking [name] class. Enjoy.  03/02/14 - Hospitalized with strep infection. Sepsis.  Echocardiogram reassuring. Infectious disease consultation. Much improved. Fever was as high as 105 she says.   Trying to avoid metformin.A1c 6.9. Lost weight. Eating well. Exercising.     Wt Readings from Last 3 Encounters:  03/02/14 223 lb (101.152 kg)  01/29/14 226 lb (102.513 kg)  01/15/14 231 lb (104.781 kg)     Past Medical History    Diagnosis Date  . Degenerative disc disease   . Spinal stenosis   . Facet joint disease   . Bulging disc   . MRSA (methicillin resistant staph aureus) culture positive 2011    GREAT TOE RIGHT FOOT  . Fibromyalgia   . Peripheral neuropathy   . MCL deficiency, knee   . Coronary arteritis   . CAD (coronary artery disease) 2009    s/p stent to LAD  . Hyperlipidemia   . Heart disease   . 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 Chicken pox     Past Surgical History  Procedure Laterality Date  . Foot surgery Right     BIG TOE  . Back surgery      X 3  . Hand surgery Right   . Gallbladder surgery    . Ablation      UTERUS  . Ablation      HEART  . Heart stent  2009    LAD  . Hand surgey Left   . Foot surgery Bilateral     PLANTAR FASCIITIS  . Foot surgery Right     2ND TOE  . Infusion pump implantation      X2 with morphine and baclofen  . Eye surgery Bilateral     Current Outpatient Prescriptions  Medication Sig Dispense Refill  . ACCU-CHEK SOFTCLIX LANCETS lancets 1-2 each by Other route daily. Use as instructed  100 each  2  . aspirin 81 MG tablet Take 81 mg by  mouth daily.        Marland Kitchen BACLOFEN PO 65.87 mcg by Intrathecal route daily. Continuous infusion with morphine pump.      . Blood Glucose Calibration (ACCU-CHEK AVIVA) SOLN 1 each by In Vitro route as needed.  1 each  1  . Blood Glucose Monitoring Suppl (ACCU-CHEK AVIVA PLUS) W/DEVICE KIT 1 Device by Does not apply route once.  1 kit  0  . buPROPion (WELLBUTRIN XL) 150 MG 24 hr tablet Take 1 tablet (150 mg total) by mouth daily.  90 tablet  3  . glucose blood (ACCU-CHEK AVIVA PLUS) test strip 1-2 each by Other route daily as needed. Use as instructed  50 each  5  . isosorbide dinitrate (ISORDIL) 30 MG tablet Take 1 tablet (30 mg total) by mouth daily.  30 tablet  6  . Multiple Vitamin (MULTIVITAMIN WITH MINERALS) TABS tablet Take 1 tablet by mouth daily.      . NON FORMULARY 8.587 mg by Intravenous  (Continuous Infusion) route daily. MORPHINE PUMP      . oxyCODONE (OXY IR/ROXICODONE) 5 MG immediate release tablet Take 5 mg by mouth 3 (three) times daily as needed for severe pain (1 to 3 times daily as needed for pain.).      Marland Kitchen PARoxetine (PAXIL) 20 MG tablet Take 0.5 tablets (10 mg total) by mouth daily. Take 0.5 tablet by mouth twice daily  90 tablet  3  . polyethylene glycol (MIRALAX / GLYCOLAX) packet Take 17 g by mouth daily.      . pregabalin (LYRICA) 75 MG capsule Take 75 mg by mouth daily.       . rosuvastatin (CRESTOR) 20 MG tablet Take 1 tablet (20 mg total) by mouth daily.  30 tablet  6  . tiZANidine (ZANAFLEX) 4 MG tablet Take 4 mg by mouth 3 (three) times daily with meals as needed for muscle spasms (2 to 3 times daily.).       No current facility-administered medications for this visit.    Allergies:   No Known Allergies  Social History:  The patient  reports that she has quit smoking. She does not have any smokeless tobacco history on file. She reports that she does not drink alcohol or use illicit drugs.   Family History  Problem Relation Age of Onset  . Cancer Mother     lung  . Diabetes Father   . Heart disease Maternal Grandfather   . Diabetes Paternal Grandmother   . Cancer Paternal Grandfather     colon  . Stroke Neg Hx     ROS:  Please see the history of present illness.   Denies any fevers, chills, orthopnea, PND, strokelike symptoms, shortness of breath   All other systems reviewed and negative.   PHYSICAL EXAM: VS:  BP 112/60  Pulse 73  Ht $R'5\' 9"'wJ$  (1.753 m)  Wt 223 lb (101.152 kg)  BMI 32.92 kg/m2 Well nourished, well developed, in no acute distress HEENT: normal, [place]/AT, EOMI Neck: no JVD, normal carotid upstroke, no bruit Cardiac:  normal S1, S2; RRR; no murmur Lungs:  clear to auscultation bilaterally, no wheezing, rhonchi or rales Abd: soft, nontender, no hepatomegaly, no bruits Ext: no edema, 2+ distal pulses Skin: warm and dry GU:  deferred Neuro: no focal abnormalities noted, AAO x 3  EKG:  None today     ASSESSMENT AND PLAN:  1. Coronary artery disease-stable. Low-dose isosorbide. She has been off of beta blocker for quite some time secondary to  fatigue. She is doing well. 2. Angina-well-controlled. She does have atypical sharp stabbing chest pains at times. Noncardiac. Isosorbide 3. Obesity-doing well with weight loss. She is almost down 10 pounds. She is trying to avoid medication for diabetes. Her last hemoglobin A1c was 6.9. She is striving for diet control.  4. Hyperlipidemia-currently on Crestor. Doing well. No myalgias. LDL less than 70. Goal. 5. Will see in follow up, 1 year.  Signed, Candee Furbish, MD [facility]  03/02/2014 2:52 PM

## 2014-03-02 NOTE — Patient Instructions (Signed)
The current medical regimen is effective;  continue present plan and medications.  Follow up in 1 year with Dr Skains.  You will receive a letter in the mail 2 months before you are due.  Please call us when you receive this letter to schedule your follow up appointment.  

## 2014-03-03 DIAGNOSIS — E119 Type 2 diabetes mellitus without complications: Secondary | ICD-10-CM | POA: Diagnosis not present

## 2014-03-10 DIAGNOSIS — E119 Type 2 diabetes mellitus without complications: Secondary | ICD-10-CM | POA: Diagnosis not present

## 2014-03-16 DIAGNOSIS — IMO0002 Reserved for concepts with insufficient information to code with codable children: Secondary | ICD-10-CM | POA: Diagnosis not present

## 2014-03-16 DIAGNOSIS — M999 Biomechanical lesion, unspecified: Secondary | ICD-10-CM | POA: Diagnosis not present

## 2014-03-16 DIAGNOSIS — M9981 Other biomechanical lesions of cervical region: Secondary | ICD-10-CM | POA: Diagnosis not present

## 2014-03-16 DIAGNOSIS — M542 Cervicalgia: Secondary | ICD-10-CM | POA: Diagnosis not present

## 2014-03-22 ENCOUNTER — Telehealth: Payer: Self-pay | Admitting: *Deleted

## 2014-03-22 NOTE — Telephone Encounter (Signed)
Pt called checking on diabetic shoes. Told pt we would get info out to dr Arnette Norris so we can get shoes approve. Pt understood.

## 2014-03-26 ENCOUNTER — Other Ambulatory Visit: Payer: Self-pay | Admitting: Internal Medicine

## 2014-04-01 DIAGNOSIS — H2513 Age-related nuclear cataract, bilateral: Secondary | ICD-10-CM | POA: Diagnosis not present

## 2014-04-01 DIAGNOSIS — M9903 Segmental and somatic dysfunction of lumbar region: Secondary | ICD-10-CM | POA: Diagnosis not present

## 2014-04-01 DIAGNOSIS — M542 Cervicalgia: Secondary | ICD-10-CM | POA: Diagnosis not present

## 2014-04-01 DIAGNOSIS — M545 Low back pain: Secondary | ICD-10-CM | POA: Diagnosis not present

## 2014-04-01 DIAGNOSIS — E119 Type 2 diabetes mellitus without complications: Secondary | ICD-10-CM | POA: Diagnosis not present

## 2014-04-01 DIAGNOSIS — M9901 Segmental and somatic dysfunction of cervical region: Secondary | ICD-10-CM | POA: Diagnosis not present

## 2014-04-01 LAB — HM DIABETES EYE EXAM

## 2014-04-12 ENCOUNTER — Telehealth: Payer: Self-pay | Admitting: Internal Medicine

## 2014-04-12 DIAGNOSIS — E119 Type 2 diabetes mellitus without complications: Secondary | ICD-10-CM

## 2014-04-12 MED ORDER — ACCU-CHEK SOFTCLIX LANCETS MISC: 1.0000 | Freq: Every day | Status: DC

## 2014-04-12 MED ORDER — GLUCOSE BLOOD VI STRP: 1.0000 | ORAL_STRIP | Freq: Two times a day (BID) | Status: DC | PRN

## 2014-04-12 NOTE — Telephone Encounter (Signed)
Rx sent to pharmacy   

## 2014-04-12 NOTE — Telephone Encounter (Signed)
Pt needs her test strips and lancets refilled.  [facility] [place] [facility] because of 04/01/14 Medicare has made some changes.  [facility] [place] but cannot.

## 2014-04-14 ENCOUNTER — Encounter: Payer: Self-pay | Admitting: Internal Medicine

## 2014-04-19 MED ORDER — GLUCOSE BLOOD VI STRP: 1.0000 | ORAL_STRIP | Freq: Two times a day (BID) | Status: DC | PRN

## 2014-04-19 MED ORDER — ACCU-CHEK SOFTCLIX LANCETS MISC: 1.0000 | Freq: Every day | Status: DC

## 2014-04-19 NOTE — Telephone Encounter (Signed)
Pt called for status of test strips and lancets to [facility] [place]. Diabetic supplies were sent to [facility] did not fill and [facility] Harm will be ready for pick up in 30 mins. Pt notified and voiced understanding.

## 2014-04-19 NOTE — Addendum Note (Signed)
Addended by: Helene Shoe on: 04/19/2014 11:11 AM   Modules accepted: Orders

## 2014-04-20 ENCOUNTER — Ambulatory Visit: Payer: Medicare Other | Admitting: Internal Medicine

## 2014-04-22 ENCOUNTER — Ambulatory Visit: Payer: Self-pay | Admitting: Pain Medicine

## 2014-04-22 DIAGNOSIS — S83241A Other tear of medial meniscus, current injury, right knee, initial encounter: Secondary | ICD-10-CM | POA: Diagnosis not present

## 2014-04-22 DIAGNOSIS — M25462 Effusion, left knee: Secondary | ICD-10-CM | POA: Diagnosis not present

## 2014-04-22 DIAGNOSIS — M47896 Other spondylosis, lumbar region: Secondary | ICD-10-CM | POA: Diagnosis not present

## 2014-04-22 DIAGNOSIS — M791 Myalgia: Secondary | ICD-10-CM | POA: Diagnosis not present

## 2014-04-22 DIAGNOSIS — M5126 Other intervertebral disc displacement, lumbar region: Secondary | ICD-10-CM | POA: Diagnosis not present

## 2014-04-22 DIAGNOSIS — M94262 Chondromalacia, left knee: Secondary | ICD-10-CM | POA: Diagnosis not present

## 2014-04-22 DIAGNOSIS — M4804 Spinal stenosis, thoracic region: Secondary | ICD-10-CM | POA: Diagnosis not present

## 2014-04-22 DIAGNOSIS — M79609 Pain in unspecified limb: Secondary | ICD-10-CM | POA: Diagnosis not present

## 2014-04-22 DIAGNOSIS — M5124 Other intervertebral disc displacement, thoracic region: Secondary | ICD-10-CM | POA: Diagnosis not present

## 2014-04-22 DIAGNOSIS — M5416 Radiculopathy, lumbar region: Secondary | ICD-10-CM | POA: Diagnosis not present

## 2014-04-22 DIAGNOSIS — M5137 Other intervertebral disc degeneration, lumbosacral region: Secondary | ICD-10-CM | POA: Diagnosis not present

## 2014-04-22 DIAGNOSIS — M47817 Spondylosis without myelopathy or radiculopathy, lumbosacral region: Secondary | ICD-10-CM | POA: Diagnosis not present

## 2014-04-22 DIAGNOSIS — S83281A Other tear of lateral meniscus, current injury, right knee, initial encounter: Secondary | ICD-10-CM | POA: Diagnosis not present

## 2014-04-26 ENCOUNTER — Ambulatory Visit: Payer: Self-pay | Admitting: Pain Medicine

## 2014-04-26 DIAGNOSIS — M791 Myalgia: Secondary | ICD-10-CM | POA: Diagnosis not present

## 2014-04-26 DIAGNOSIS — S83241A Other tear of medial meniscus, current injury, right knee, initial encounter: Secondary | ICD-10-CM | POA: Diagnosis not present

## 2014-04-26 DIAGNOSIS — M79609 Pain in unspecified limb: Secondary | ICD-10-CM | POA: Diagnosis not present

## 2014-04-26 DIAGNOSIS — M5416 Radiculopathy, lumbar region: Secondary | ICD-10-CM | POA: Diagnosis not present

## 2014-04-26 DIAGNOSIS — M179 Osteoarthritis of knee, unspecified: Secondary | ICD-10-CM | POA: Diagnosis not present

## 2014-04-26 DIAGNOSIS — M2241 Chondromalacia patellae, right knee: Secondary | ICD-10-CM | POA: Diagnosis not present

## 2014-04-26 DIAGNOSIS — S83501A Sprain of unspecified cruciate ligament of right knee, initial encounter: Secondary | ICD-10-CM | POA: Diagnosis not present

## 2014-04-26 DIAGNOSIS — M4806 Spinal stenosis, lumbar region: Secondary | ICD-10-CM | POA: Diagnosis not present

## 2014-04-26 DIAGNOSIS — M47896 Other spondylosis, lumbar region: Secondary | ICD-10-CM | POA: Diagnosis not present

## 2014-04-26 DIAGNOSIS — M47817 Spondylosis without myelopathy or radiculopathy, lumbosacral region: Secondary | ICD-10-CM | POA: Diagnosis not present

## 2014-04-27 ENCOUNTER — Encounter: Payer: Self-pay | Admitting: Internal Medicine

## 2014-04-27 ENCOUNTER — Ambulatory Visit (INDEPENDENT_AMBULATORY_CARE_PROVIDER_SITE_OTHER): Payer: Medicare Other | Admitting: Internal Medicine

## 2014-04-27 VITALS — BP 124/70 | HR 78 | Temp 98.0°F | Wt 212.0 lb

## 2014-04-27 DIAGNOSIS — E119 Type 2 diabetes mellitus without complications: Secondary | ICD-10-CM | POA: Diagnosis not present

## 2014-04-27 DIAGNOSIS — E7800 Pure hypercholesterolemia, unspecified: Secondary | ICD-10-CM

## 2014-04-27 DIAGNOSIS — E1141 Type 2 diabetes mellitus with diabetic mononeuropathy: Secondary | ICD-10-CM | POA: Insufficient documentation

## 2014-04-27 LAB — COMPREHENSIVE METABOLIC PANEL
ALT: 24 U/L (ref 0–35)
AST: 23 U/L (ref 0–37)
Albumin: 3.9 g/dL (ref 3.5–5.2)
Alkaline Phosphatase: 95 U/L (ref 39–117)
BUN: 19 mg/dL (ref 6–23)
CO2: 29 mEq/L (ref 19–32)
Calcium: 9.3 mg/dL (ref 8.4–10.5)
Chloride: 102 mEq/L (ref 96–112)
Creatinine, Ser: 1 mg/dL (ref 0.4–1.2)
GFR: 64.53 mL/min (ref >60.00)
Glucose, Bld: 146 mg/dL — ABNORMAL HIGH (ref 70–99)
Potassium: 4.4 mEq/L (ref 3.5–5.1)
Sodium: 138 mEq/L (ref 135–145)
Total Bilirubin: 0.6 mg/dL (ref 0.2–1.2)
Total Protein: 7.9 g/dL (ref 6.0–8.3)

## 2014-04-27 LAB — LIPID PANEL
Cholesterol: 93 mg/dL (ref 0–200)
HDL: 29 mg/dL — ABNORMAL LOW (ref >39.00)
LDL Cholesterol: 41 mg/dL (ref 0–99)
NonHDL: 64
Total CHOL/HDL Ratio: 3
Triglycerides: 113 mg/dL (ref 0.0–149.0)
VLDL: 22.6 mg/dL (ref 0.0–40.0)

## 2014-04-27 LAB — MICROALBUMIN / CREATININE URINE RATIO
Creatinine,U: 335.1 mg/dL
Microalb Creat Ratio: 0.5 mg/g (ref 0.0–30.0)
Microalb, Ur: 1.8 mg/dL (ref 0.0–1.9)

## 2014-04-27 LAB — HEMOGLOBIN A1C: Hgb A1c MFr Bld: 6.2 % (ref 4.6–6.5)

## 2014-04-27 NOTE — Assessment & Plan Note (Signed)
Will repeat lipid profile and CMET today She had high cholesterol prior to the diabetes Continue crestor at this time

## 2014-04-27 NOTE — Patient Instructions (Addendum)
Exercise to Lose Weight Exercise and a healthy diet may help you lose weight. Your doctor may suggest specific exercises. EXERCISE IDEAS AND TIPS  Choose low-cost things you enjoy doing, such as walking, bicycling, or exercising to workout videos.  Take stairs instead of the elevator.  Walk during your lunch break.  Park your car further away from work or school.  Go to a gym or an exercise class.  Start with 5 to 10 minutes of exercise each day. Build up to 30 minutes of exercise 4 to 6 days a week.  Wear shoes with good support and comfortable clothes.  Stretch before and after working out.  Work out until you breathe harder and your heart beats faster.  Drink extra water when you exercise.  Do not do so much that you hurt yourself, feel dizzy, or get very short of breath. Exercises that burn about 150 calories:  Running 1  miles in 15 minutes.  Playing volleyball for 45 to 60 minutes.  Washing and waxing a car for 45 to 60 minutes.  Playing touch football for 45 minutes.  Walking 1  miles in 35 minutes.  Pushing a stroller 1  miles in 30 minutes.  Playing basketball for 30 minutes.  Raking leaves for 30 minutes.  Bicycling 5 miles in 30 minutes.  Walking 2 miles in 30 minutes.  Dancing for 30 minutes.  Shoveling snow for 15 minutes.  Swimming laps for 20 minutes.  Walking up stairs for 15 minutes.  Bicycling 4 miles in 15 minutes.  Gardening for 30 to 45 minutes.  Jumping rope for 15 minutes.  Washing windows or floors for 45 to 60 minutes. Document Released: 07/21/2010 Document Revised: 09/10/2011 Document Reviewed: 07/21/2010 Exit[employer] Patient Information 2015 [employer]. This information is not intended to replace advice given to you by your health care provider. Make sure you discuss any questions you have with your health care provider.  

## 2014-04-27 NOTE — Progress Notes (Signed)
Pre visit review using our clinic review tool, if applicable. No additional management support is needed unless otherwise documented below in the visit note. 

## 2014-04-27 NOTE — Progress Notes (Signed)
Subjective:    Patient ID: Melinda [name], female    DOB: 1956-02-20, 58 y.o.   MRN: [id number]  HPI  Pt presents to the clinic today to follow up A1C. Sugars range from 102-223. Her last A1c was 6.9%. She was started on Metformin at that time as well as advised to work on diet and exercise. She reports that she never did take the medication. She did make lifestyle changes and has lost approximately 19 pounds. She did cut out soda. She is preparing food fresh, cut out white bread, pasta etc and she has increased her walking.  Review of Systems      Past Medical History  Diagnosis Date  . Degenerative disc disease   . Spinal stenosis   . Facet joint disease   . Bulging disc   . MRSA (methicillin resistant staph aureus) culture positive 2011    GREAT TOE RIGHT FOOT  . Fibromyalgia   . Peripheral neuropathy   . MCL deficiency, knee   . Coronary arteritis   . CAD (coronary artery disease) 2009    s/p stent to LAD  . Hyperlipidemia   . Heart disease   . 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 Chicken pox     Current Outpatient Prescriptions  Medication Sig Dispense Refill  . ACCU-CHEK SOFTCLIX LANCETS lancets 1-2 each by Other route daily. Use as instructed  100 each  2  . aspirin 81 MG tablet Take 81 mg by mouth daily.        Marland Kitchen BACLOFEN PO 65.87 mcg by Intrathecal route daily. Continuous infusion with morphine pump.      . Blood Glucose Calibration (ACCU-CHEK AVIVA) SOLN 1 each by In Vitro route as needed.  1 each  1  . Blood Glucose Monitoring Suppl (ACCU-CHEK AVIVA PLUS) W/DEVICE KIT 1 Device by Does not apply route once.  1 kit  0  . buPROPion (WELLBUTRIN XL) 150 MG 24 hr tablet Take 1 tablet (150 mg total) by mouth daily.  90 tablet  3  . glucose blood (ACCU-CHEK AVIVA PLUS) test strip 1-2 each by Other route 2 (two) times daily as needed.  100 each  5  . isosorbide dinitrate (ISORDIL) 30 MG tablet Take 1 tablet (30 mg total) by mouth daily.  30 tablet  6  .  Multiple Vitamin (MULTIVITAMIN WITH MINERALS) TABS tablet Take 1 tablet by mouth daily.      . NON FORMULARY 8.587 mg by Intravenous (Continuous Infusion) route daily. MORPHINE PUMP      . oxyCODONE (OXY IR/ROXICODONE) 5 MG immediate release tablet Take 5 mg by mouth 3 (three) times daily as needed for severe pain (1 to 3 times daily as needed for pain.).      Marland Kitchen PARoxetine (PAXIL) 20 MG tablet Take 0.5 tablets (10 mg total) by mouth daily. Take 0.5 tablet by mouth twice daily  90 tablet  3  . polyethylene glycol (MIRALAX / GLYCOLAX) packet Take 17 g by mouth daily.      . pregabalin (LYRICA) 75 MG capsule Take 75 mg by mouth daily.       . rosuvastatin (CRESTOR) 20 MG tablet Take 1 tablet (20 mg total) by mouth daily.  30 tablet  6  . tiZANidine (ZANAFLEX) 4 MG tablet Take 4 mg by mouth 3 (three) times daily with meals as needed for muscle spasms (2 to 3 times daily.).       No current facility-administered medications for this  visit.    No Known Allergies  Family History  Problem Relation Age of Onset  . Cancer Mother     lung  . Diabetes Father   . Heart disease Maternal Grandfather   . Diabetes Paternal Grandmother   . Cancer Paternal Grandfather     colon  . Stroke Neg Hx     History   Social History  . Marital Status: Single    Spouse Name: N/A    Number of Children: N/A  . Years of Education: N/A   Occupational History  . Not on file.   Social History Main Topics  . Smoking status: Former Research scientist (life sciences)  . Smokeless tobacco: Not on file  . Alcohol Use: No  . Drug Use: No  . Sexual Activity: Yes   Other Topics Concern  . Not on file   Social History Narrative  . No narrative on file     Constitutional: Pt reports fatigue. Denies fever, malaise, headache or abrupt weight changes.  Respiratory: Denies difficulty breathing, shortness of breath, cough or sputum production.   Cardiovascular: Denies chest pain, chest tightness, palpitations or swelling in the hands or  feet.  Gastrointestinal: Denies abdominal pain, bloating, constipation, diarrhea or blood in the stool.  Skin: Denies redness, rashes, lesions or ulcercations.  Neurological: Denies dizziness, difficulty with memory, difficulty with speech or problems with balance and coordination.   No other specific complaints in a complete review of systems (except as listed in HPI above).  Objective:   Physical Exam   BP 124/70  Pulse 78  Temp(Src) 98 F (36.7 C) (Oral)  Wt 212 lb (96.163 kg)  SpO2 98% Wt Readings from Last 3 Encounters:  04/27/14 212 lb (96.163 kg)  03/02/14 223 lb (101.152 kg)  01/29/14 226 lb (102.513 kg)    General: Appears her stated age, obese but well developed, well nourished in NAD. Skin: Warm, dry and intact. No rashes, lesions or ulcerations noted. Cardiovascular: Normal rate and rhythm. S1,S2 noted.  No murmur, rubs or gallops noted. No JVD or BLE edema. No carotid bruits noted. Pulmonary/Chest: Normal effort and positive vesicular breath sounds. No respiratory distress. No wheezes, rales or ronchi noted.  Abdomen: Soft and nontender. Normal bowel sounds, no bruits noted. No distention or masses noted. Liver, spleen and kidneys non palpable. Neurological: Alert and oriented.   BMET    Component Value Date/Time   NA 143 10/09/2013 0514   K 3.6* 10/09/2013 0514   CL 100 10/09/2013 0514   CO2 32 10/09/2013 0514   GLUCOSE 163* 10/09/2013 0514   BUN 8 10/09/2013 0514   CREATININE 0.99 10/09/2013 0514   CALCIUM 8.7 10/09/2013 0514   GFRNONAA 63* 10/09/2013 0514   GFRAA 72* 10/09/2013 0514    Lipid Panel  No results found for this basename: chol, trig, hdl, cholhdl, vldl, ldlcalc    CBC    Component Value Date/Time   WBC 8.2 10/09/2013 0514   RBC 3.82* 10/09/2013 0514   RBC 3.47* 10/05/2013 0805   HGB 10.9* 10/09/2013 0514   HCT 31.9* 10/09/2013 0514   PLT 216 10/09/2013 0514   MCV 83.5 10/09/2013 0514   MCH 28.5 10/09/2013 0514   MCHC 34.2 10/09/2013 0514   RDW 13.5  10/09/2013 0514   LYMPHSABS 0.8 10/06/2013 0900   MONOABS 0.8 10/06/2013 0900   EOSABS 0.0 10/06/2013 0900   BASOSABS 0.0 10/06/2013 0900    Hgb A1C Lab Results  Component Value Date   HGBA1C 6.9* 01/15/2014  Assessment & Plan:

## 2014-04-27 NOTE — Assessment & Plan Note (Addendum)
Last A1C reviewed 6.9% Has lost 19 lbs Will repeat A1C and microalbumin today- if lower, no need to start Metformin, will continue diet and exercise If higher, will need to start Metformin

## 2014-05-05 ENCOUNTER — Ambulatory Visit: Payer: Self-pay | Admitting: Pain Medicine

## 2014-05-05 ENCOUNTER — Other Ambulatory Visit: Payer: Self-pay | Admitting: Cardiology

## 2014-05-05 DIAGNOSIS — M5416 Radiculopathy, lumbar region: Secondary | ICD-10-CM | POA: Diagnosis not present

## 2014-05-05 DIAGNOSIS — M4804 Spinal stenosis, thoracic region: Secondary | ICD-10-CM | POA: Diagnosis not present

## 2014-05-05 DIAGNOSIS — M4806 Spinal stenosis, lumbar region: Secondary | ICD-10-CM | POA: Diagnosis not present

## 2014-05-05 DIAGNOSIS — M47817 Spondylosis without myelopathy or radiculopathy, lumbosacral region: Secondary | ICD-10-CM | POA: Diagnosis not present

## 2014-05-05 DIAGNOSIS — M79609 Pain in unspecified limb: Secondary | ICD-10-CM | POA: Diagnosis not present

## 2014-05-05 DIAGNOSIS — M47896 Other spondylosis, lumbar region: Secondary | ICD-10-CM | POA: Diagnosis not present

## 2014-05-05 DIAGNOSIS — M791 Myalgia: Secondary | ICD-10-CM | POA: Diagnosis not present

## 2014-05-13 ENCOUNTER — Ambulatory Visit: Payer: Self-pay | Admitting: Pain Medicine

## 2014-05-13 DIAGNOSIS — S83281A Other tear of lateral meniscus, current injury, right knee, initial encounter: Secondary | ICD-10-CM | POA: Diagnosis not present

## 2014-05-13 DIAGNOSIS — M4804 Spinal stenosis, thoracic region: Secondary | ICD-10-CM | POA: Diagnosis not present

## 2014-05-13 DIAGNOSIS — S83241A Other tear of medial meniscus, current injury, right knee, initial encounter: Secondary | ICD-10-CM | POA: Diagnosis not present

## 2014-05-13 DIAGNOSIS — M25551 Pain in right hip: Secondary | ICD-10-CM | POA: Diagnosis not present

## 2014-05-13 DIAGNOSIS — M5137 Other intervertebral disc degeneration, lumbosacral region: Secondary | ICD-10-CM | POA: Diagnosis not present

## 2014-05-13 DIAGNOSIS — M79609 Pain in unspecified limb: Secondary | ICD-10-CM | POA: Diagnosis not present

## 2014-05-13 DIAGNOSIS — M47896 Other spondylosis, lumbar region: Secondary | ICD-10-CM | POA: Diagnosis not present

## 2014-05-13 DIAGNOSIS — M47817 Spondylosis without myelopathy or radiculopathy, lumbosacral region: Secondary | ICD-10-CM | POA: Diagnosis not present

## 2014-05-13 DIAGNOSIS — M2241 Chondromalacia patellae, right knee: Secondary | ICD-10-CM | POA: Diagnosis not present

## 2014-05-13 DIAGNOSIS — M179 Osteoarthritis of knee, unspecified: Secondary | ICD-10-CM | POA: Diagnosis not present

## 2014-05-13 DIAGNOSIS — M791 Myalgia: Secondary | ICD-10-CM | POA: Diagnosis not present

## 2014-05-13 DIAGNOSIS — M5124 Other intervertebral disc displacement, thoracic region: Secondary | ICD-10-CM | POA: Diagnosis not present

## 2014-05-13 DIAGNOSIS — M5416 Radiculopathy, lumbar region: Secondary | ICD-10-CM | POA: Diagnosis not present

## 2014-05-19 ENCOUNTER — Ambulatory Visit: Payer: Self-pay | Admitting: Pain Medicine

## 2014-05-19 DIAGNOSIS — Z23 Encounter for immunization: Secondary | ICD-10-CM | POA: Diagnosis not present

## 2014-05-19 DIAGNOSIS — G894 Chronic pain syndrome: Secondary | ICD-10-CM | POA: Diagnosis not present

## 2014-05-19 DIAGNOSIS — E119 Type 2 diabetes mellitus without complications: Secondary | ICD-10-CM | POA: Diagnosis not present

## 2014-05-19 LAB — URIC ACID: Uric Acid: 5.8 mg/dL (ref 2.6–6.0)

## 2014-05-20 DIAGNOSIS — G2581 Restless legs syndrome: Secondary | ICD-10-CM | POA: Diagnosis not present

## 2014-05-20 DIAGNOSIS — G4733 Obstructive sleep apnea (adult) (pediatric): Secondary | ICD-10-CM | POA: Diagnosis not present

## 2014-05-21 ENCOUNTER — Ambulatory Visit: Payer: Self-pay | Admitting: Pain Medicine

## 2014-05-21 DIAGNOSIS — M94261 Chondromalacia, right knee: Secondary | ICD-10-CM | POA: Diagnosis not present

## 2014-05-21 DIAGNOSIS — M25551 Pain in right hip: Secondary | ICD-10-CM | POA: Diagnosis not present

## 2014-05-21 DIAGNOSIS — S83241A Other tear of medial meniscus, current injury, right knee, initial encounter: Secondary | ICD-10-CM | POA: Diagnosis not present

## 2014-05-21 DIAGNOSIS — M47896 Other spondylosis, lumbar region: Secondary | ICD-10-CM | POA: Diagnosis not present

## 2014-05-21 DIAGNOSIS — M791 Myalgia: Secondary | ICD-10-CM | POA: Diagnosis not present

## 2014-05-21 DIAGNOSIS — S83281A Other tear of lateral meniscus, current injury, right knee, initial encounter: Secondary | ICD-10-CM | POA: Diagnosis not present

## 2014-05-21 DIAGNOSIS — M4804 Spinal stenosis, thoracic region: Secondary | ICD-10-CM | POA: Diagnosis not present

## 2014-05-21 DIAGNOSIS — M5416 Radiculopathy, lumbar region: Secondary | ICD-10-CM | POA: Diagnosis not present

## 2014-05-21 DIAGNOSIS — M179 Osteoarthritis of knee, unspecified: Secondary | ICD-10-CM | POA: Diagnosis not present

## 2014-05-21 DIAGNOSIS — M47817 Spondylosis without myelopathy or radiculopathy, lumbosacral region: Secondary | ICD-10-CM | POA: Diagnosis not present

## 2014-05-21 DIAGNOSIS — M5124 Other intervertebral disc displacement, thoracic region: Secondary | ICD-10-CM | POA: Diagnosis not present

## 2014-05-21 DIAGNOSIS — M5137 Other intervertebral disc degeneration, lumbosacral region: Secondary | ICD-10-CM | POA: Diagnosis not present

## 2014-05-21 DIAGNOSIS — M79609 Pain in unspecified limb: Secondary | ICD-10-CM | POA: Diagnosis not present

## 2014-05-31 ENCOUNTER — Telehealth: Payer: Self-pay | Admitting: Internal Medicine

## 2014-05-31 NOTE — Telephone Encounter (Signed)
Pt made appt for 06/01/14

## 2014-05-31 NOTE — Telephone Encounter (Signed)
She has medicare. Medicare requires an OV to do a referral. Please see if she would like to schedule an appt

## 2014-05-31 NOTE — Telephone Encounter (Signed)
Pt calling in with both arms are tingling, numbness into hands and wrist. Pt thinks its coming down her neck. Wants a referral to a neurologist. Please advise. Thank you!

## 2014-06-01 ENCOUNTER — Ambulatory Visit (INDEPENDENT_AMBULATORY_CARE_PROVIDER_SITE_OTHER): Payer: Medicare Other | Admitting: Internal Medicine

## 2014-06-01 ENCOUNTER — Encounter: Payer: Self-pay | Admitting: Internal Medicine

## 2014-06-01 VITALS — BP 122/56 | HR 84 | Temp 98.4°F | Wt 215.0 lb

## 2014-06-01 DIAGNOSIS — M5412 Radiculopathy, cervical region: Secondary | ICD-10-CM

## 2014-06-01 DIAGNOSIS — I208 Other forms of angina pectoris: Secondary | ICD-10-CM | POA: Diagnosis not present

## 2014-06-01 NOTE — Progress Notes (Signed)
Pre visit review using our clinic review tool, if applicable. No additional management support is needed unless otherwise documented below in the visit note. 

## 2014-06-01 NOTE — Patient Instructions (Signed)

## 2014-06-01 NOTE — Progress Notes (Signed)
Subjective:    Patient ID: ELLYANNA [name], female    DOB: January 30, 1956, 58 y.o.   MRN: [id number]  HPI  Pt presents to the clinic today to discuss possible referral to a neurosurgeon. She has been experiencing numbness and tingling in both of her arms. This has been getting worse over the 4-5 months. She has tried having a chiropractor adjust her cervical spine but this has not helped. She is on Lyrica, Oxycodone and she also has a pain pump. She has had a MRI of her cervical spine in 2003 which showed:  IMPRESSION  C3-4: SHALLOW LEFT PARACENTRAL DISK PROTRUSION WITHOUT SPURRING. THIS DOES RESULT IN MILD TO MODERATE FORAMINAL NARROWING ON THE LEFT. MRI OF THE THORACIC SPINE WITHOUT CONTRAST ALIGNMENT ANATOMIC. THERE IS NORMAL OSSEOUS SIGNAL. THE THORACIC CORD HAS NORMAL SIGNAL. NEGATIVE FOR SPINAL STENOSIS. AXIAL IMAGING IS NEGATIVE FOR DISK HERNIATION, CENTRAL NOR FORAMINAL STENOSIS.  She is concerned because this numbness and tingling are affecting her ADL's. She is having trouble tying her shoes, gripping things, even wiping herself when she goes to the restroom.  Review of Systems      Past Medical History  Diagnosis Date  . Degenerative disc disease   . Spinal stenosis   . Facet joint disease   . Bulging disc   . MRSA (methicillin resistant staph aureus) culture positive 2011    GREAT TOE RIGHT FOOT  . Fibromyalgia   . Peripheral neuropathy   . MCL deficiency, knee   . Coronary arteritis   . CAD (coronary artery disease) 2009    s/p stent to LAD  . Hyperlipidemia   . Heart disease   . 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 Chicken pox     Current Outpatient Prescriptions  Medication Sig Dispense Refill  . ACCU-CHEK SOFTCLIX LANCETS lancets 1-2 each by Other route daily. Use as instructed 100 each 2  . aspirin 81 MG tablet Take 81 mg by mouth daily.      Marland Kitchen BACLOFEN PO 65.87 mcg by Intrathecal route daily. Continuous infusion with morphine pump.     . Blood Glucose Calibration (ACCU-CHEK AVIVA) SOLN 1 each by In Vitro route as needed. 1 each 1  . Blood Glucose Monitoring Suppl (ACCU-CHEK AVIVA PLUS) W/DEVICE KIT 1 Device by Does not apply route once. 1 kit 0  . buPROPion (WELLBUTRIN XL) 150 MG 24 hr tablet Take 1 tablet (150 mg total) by mouth daily. 90 tablet 3  . CRESTOR 20 MG tablet TAKE 1 TABLET BY MOUTH DAILY 30 tablet 10  . glucose blood (ACCU-CHEK AVIVA PLUS) test strip 1-2 each by Other route 2 (two) times daily as needed. 100 each 5  . isosorbide dinitrate (ISORDIL) 30 MG tablet Take 1 tablet (30 mg total) by mouth daily. 30 tablet 6  . Multiple Vitamin (MULTIVITAMIN WITH MINERALS) TABS tablet Take 1 tablet by mouth daily.    . NON FORMULARY 8.587 mg by Intravenous (Continuous Infusion) route daily. MORPHINE PUMP    . oxyCODONE (OXY IR/ROXICODONE) 5 MG immediate release tablet Take 5 mg by mouth 3 (three) times daily as needed for severe pain (1 to 3 times daily as needed for pain.).    Marland Kitchen PARoxetine (PAXIL) 20 MG tablet Take 0.5 tablets (10 mg total) by mouth daily. Take 0.5 tablet by mouth twice daily 90 tablet 3  . polyethylene glycol (MIRALAX / GLYCOLAX) packet Take 17 g by mouth daily.    . pregabalin (LYRICA) 75 MG  capsule Take 75 mg by mouth daily.     Marland Kitchen tiZANidine (ZANAFLEX) 4 MG tablet Take 4 mg by mouth 3 (three) times daily with meals as needed for muscle spasms (2 to 3 times daily.).     No current facility-administered medications for this visit.    No Known Allergies  Family History  Problem Relation Age of Onset  . Cancer Mother     lung  . Diabetes Father   . Heart disease Maternal Grandfather   . Diabetes Paternal Grandmother   . Cancer Paternal Grandfather     colon  . Stroke Neg Hx     History   Social History  . Marital Status: Single    Spouse Name: N/A    Number of Children: N/A  . Years of Education: N/A   Occupational History  . Not on file.   Social History Main Topics  . Smoking  status: Former Research scientist (life sciences)  . Smokeless tobacco: Not on file  . Alcohol Use: No  . Drug Use: No  . Sexual Activity: Yes   Other Topics Concern  . Not on file   Social History Narrative     Constitutional: Denies fever, malaise, fatigue, headache or abrupt weight changes.  Musculoskeletal: Denies decrease in range of motion, difficulty with gait, muscle pain or joint pain and swelling.  Neurological: Pt reports numbness and tingling in hands.  No other specific complaints in a complete review of systems (except as listed in HPI above).  Objective:   Physical Exam   BP 122/56 mmHg  Pulse 84  Temp(Src) 98.4 F (36.9 C) (Oral)  Wt 215 lb (97.523 kg)  SpO2 98% Wt Readings from Last 3 Encounters:  06/01/14 215 lb (97.523 kg)  04/27/14 212 lb (96.163 kg)  03/02/14 223 lb (101.152 kg)    General: Appears her stated age, obese but well developed, well nourished in NAD. Cardiovascular: Normal rate and rhythm. S1,S2 noted.  No murmur, rubs or gallops noted.  Pulmonary/Chest: Normal effort and positive vesicular breath sounds. No respiratory distress. No wheezes, rales or ronchi noted.  Musculoskeletal: Normal flexion and extension of the cervical spine, Decreased lateral rotation L>R. No pain with palpation of the cervical spine. Strength 4/5 LUE, 5/5 RUE.  Neurological: Sensation intact on the right, diminished on the left. +DTRs bilaterally. Negative Tinels. Unable to perform Phalen due to pain.   BMET    Component Value Date/Time   NA 138 04/27/2014 1036   K 4.4 04/27/2014 1036   CL 102 04/27/2014 1036   CO2 29 04/27/2014 1036   GLUCOSE 146* 04/27/2014 1036   BUN 19 04/27/2014 1036   CREATININE 1.0 04/27/2014 1036   CALCIUM 9.3 04/27/2014 1036   GFRNONAA 63* 10/09/2013 0514   GFRAA 72* 10/09/2013 0514    Lipid Panel     Component Value Date/Time   CHOL 93 04/27/2014 1036   TRIG 113.0 04/27/2014 1036   HDL 29.00* 04/27/2014 1036   CHOLHDL 3 04/27/2014 1036   VLDL  22.6 04/27/2014 1036   LDLCALC 41 04/27/2014 1036    CBC    Component Value Date/Time   WBC 8.2 10/09/2013 0514   RBC 3.82* 10/09/2013 0514   RBC 3.47* 10/05/2013 0805   HGB 10.9* 10/09/2013 0514   HCT 31.9* 10/09/2013 0514   PLT 216 10/09/2013 0514   MCV 83.5 10/09/2013 0514   MCH 28.5 10/09/2013 0514   MCHC 34.2 10/09/2013 0514   RDW 13.5 10/09/2013 0514   LYMPHSABS 0.8 10/06/2013  0900   MONOABS 0.8 10/06/2013 0900   EOSABS 0.0 10/06/2013 0900   BASOSABS 0.0 10/06/2013 0900    Hgb A1C Lab Results  Component Value Date   HGBA1C 6.2 04/27/2014        Assessment & Plan:   Cervical radiculitis:  Will repeat MRI of cervical spine She has a pain pump that needs to be turned off- will coordinate this with her pain management specialist After I receive the MRI results will refer to neurosurgery- Dr. Durene Cal who did her previous surgeris Continue current pain management regimen  Will follow up after MRI, RTC if symptoms persist or worsen

## 2014-06-02 ENCOUNTER — Telehealth: Payer: Self-pay

## 2014-06-02 NOTE — Telephone Encounter (Signed)
Pt left v/m; pt was seen 06/01/14 and pt wants to know status of [name] NP speaking with pain mgt about cutting pain pump off when has MRI of neck. Pt request cb.

## 2014-06-02 NOTE — Telephone Encounter (Signed)
Threasa Beards has already called pt an addressed this

## 2014-06-02 NOTE — Addendum Note (Signed)
Addended by: Lurlean Nanny on: 06/02/2014 10:20 AM   Modules accepted: Medications

## 2014-06-03 ENCOUNTER — Telehealth: Payer: Self-pay | Admitting: Internal Medicine

## 2014-06-03 ENCOUNTER — Other Ambulatory Visit: Payer: Self-pay | Admitting: Internal Medicine

## 2014-06-03 DIAGNOSIS — M5412 Radiculopathy, cervical region: Secondary | ICD-10-CM

## 2014-06-03 NOTE — Telephone Encounter (Signed)
Pt is returning call regarding MRI. Pt is requesting to speak with [facility].

## 2014-06-03 NOTE — Telephone Encounter (Signed)
Pt states she got in contact with the pain clinic and they stated it will have to be on a Friday 8am -3pm only due to her needing pump being cut off and then turned back on--these are the only days a nurse who knows how to turn off pump is there Dec 4th-11th-18th--

## 2014-06-03 NOTE — Telephone Encounter (Signed)
Will order MRI and see if Rosaria Ferries can schedule for one of the dates listed  12/4, 12/11, 12/18

## 2014-06-03 NOTE — Telephone Encounter (Signed)
To melanie

## 2014-06-07 ENCOUNTER — Telehealth: Payer: Self-pay | Admitting: Cardiology

## 2014-06-07 NOTE — Telephone Encounter (Signed)
New Message  Pt called states that yesterday she had pretty bad chest pains/(No chest pains today) took a nitro it helped but a little later. she checked her pulse on her neck and in that area it was fairly sore. She also states that when she bend over her right carotid area of the neck feels full and Heavy. pt requests a call back to determine if an appt is needed//sr

## 2014-06-07 NOTE — Telephone Encounter (Signed)
Patient reports an episode of chest pain yesterday that she said was a 6 on a scale of1-10. She took one Nitro with relief. No N/V, diaphoresis or sob. She did note some pain on rt side of neck when she bent over, near carotid area. Had another milder episode of chest pain in the evening, and Niro relieved that. Concerned about the sore spot on her neck. Hurts to touch, but not enlarged.

## 2014-06-07 NOTE — Telephone Encounter (Signed)
Have her come in Wed at [place], [place], MD

## 2014-06-08 ENCOUNTER — Telehealth: Payer: Self-pay

## 2014-06-08 NOTE — Telephone Encounter (Signed)
Pt made appt for tomorrow

## 2014-06-08 NOTE — Telephone Encounter (Signed)
I cannot put in MRI for things she has not been seen for. She will need to be evaluated

## 2014-06-08 NOTE — Telephone Encounter (Signed)
Patient informed. Will be here 12/09 at 10:00 am.

## 2014-06-08 NOTE — Telephone Encounter (Signed)
Pt left v/m; pt scheduled for MRI of C spine on 06/11/14; 06/03/14 or 06/04/14 pt started with clawing sensaton and pain at lower LS Spine; pt has difficulty in walking and straightening up when sits or lays down; pt experiencing some weakness in lt leg. Pt does not feel like driving to office for appt and request to add MRI of LS spine on 06/11/14. Pt request cb.

## 2014-06-09 ENCOUNTER — Encounter: Payer: Self-pay | Admitting: Internal Medicine

## 2014-06-09 ENCOUNTER — Encounter: Payer: Self-pay | Admitting: Cardiology

## 2014-06-09 ENCOUNTER — Ambulatory Visit (INDEPENDENT_AMBULATORY_CARE_PROVIDER_SITE_OTHER): Payer: Medicare Other | Admitting: Cardiology

## 2014-06-09 ENCOUNTER — Ambulatory Visit (INDEPENDENT_AMBULATORY_CARE_PROVIDER_SITE_OTHER): Payer: Medicare Other | Admitting: Internal Medicine

## 2014-06-09 VITALS — BP 90/62 | HR 71 | Ht 69.0 in | Wt 214.0 lb

## 2014-06-09 VITALS — BP 110/70 | HR 63 | Temp 98.4°F | Wt 213.0 lb

## 2014-06-09 DIAGNOSIS — I2510 Atherosclerotic heart disease of native coronary artery without angina pectoris: Secondary | ICD-10-CM

## 2014-06-09 DIAGNOSIS — M5442 Lumbago with sciatica, left side: Secondary | ICD-10-CM | POA: Diagnosis not present

## 2014-06-09 DIAGNOSIS — M542 Cervicalgia: Secondary | ICD-10-CM | POA: Diagnosis not present

## 2014-06-09 DIAGNOSIS — I208 Other forms of angina pectoris: Secondary | ICD-10-CM

## 2014-06-09 MED ORDER — NITROGLYCERIN 0.4 MG SL SUBL: 0.4000 mg | SUBLINGUAL_TABLET | SUBLINGUAL | Status: DC | PRN

## 2014-06-09 NOTE — Progress Notes (Signed)
Subjective:    Patient ID: Melinda [name], female    DOB: 1956/04/08, 58 y.o.   MRN: [id number]  HPI  Pt presents to the clinic today with c/o lower back pain. She reports this started 4-5 days ago. She has pain with walking and has difficulty straitening up when she stands up. She has noticed some weakness in the left leg as well. She did have a MRI of the lumbar spine in 2003 which showed:  IMPRESSION  DISK BULGING AND MILD FACET ARTHROPATHY AT L4-5 SIMILAR TO THE PRIOR STUDY. PROBABLE POSTOP CHANGES ON THE RIGHT AT L5-S1 WITH PROGRESSIVE LOSS OF DISK HEIGHT AND NO DISK HERNIATION.  She denies loss of bowel or bladder. She has had 3 surgeries on her back in the past. She reports that if the MRI of her cervical spine 06/11/14. She had hoped that if needed a lumbar spine MRI repeated, she could do it the same day.  Review of Systems      Past Medical History  Diagnosis Date  . Degenerative disc disease   . Spinal stenosis   . Facet joint disease   . Bulging disc   . MRSA (methicillin resistant staph aureus) culture positive 2011    GREAT TOE RIGHT FOOT  . Fibromyalgia   . Peripheral neuropathy   . MCL deficiency, knee   . Coronary arteritis   . CAD (coronary artery disease) 2009    s/p stent to LAD  . Hyperlipidemia   . Heart disease   . 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 Chicken pox     Current Outpatient Prescriptions  Medication Sig Dispense Refill  . ACCU-CHEK SOFTCLIX LANCETS lancets 1-2 each by Other route daily. Use as instructed 100 each 2  . aspirin 81 MG tablet Take 81 mg by mouth daily.      Marland Kitchen BACLOFEN PO 134.81 mcg by Intrathecal route daily. Continuous infusion with morphine pump.    . Blood Glucose Calibration (ACCU-CHEK AVIVA) SOLN 1 each by In Vitro route as needed. 1 each 1  . Blood Glucose Monitoring Suppl (ACCU-CHEK AVIVA PLUS) W/DEVICE KIT 1 Device by Does not apply route once. 1 kit 0  . bupivacaine (MARCAINE) 0.25 % injection  8.988 mg by Intrathecal route daily.    Marland Kitchen buPROPion (WELLBUTRIN XL) 150 MG 24 hr tablet Take 1 tablet (150 mg total) by mouth daily. 90 tablet 3  . CRESTOR 20 MG tablet TAKE 1 TABLET BY MOUTH DAILY 30 tablet 10  . fentaNYL 5,000 mcg by Intrathecal route daily. 134.81 mcg/day    . glucose blood (ACCU-CHEK AVIVA PLUS) test strip 1-2 each by Other route 2 (two) times daily as needed. 100 each 5  . isosorbide dinitrate (ISORDIL) 30 MG tablet Take 1 tablet (30 mg total) by mouth daily. 30 tablet 6  . LYRICA 100 MG capsule     . Multiple Vitamin (MULTIVITAMIN WITH MINERALS) TABS tablet Take 1 tablet by mouth daily.    . nitroGLYCERIN (NITROSTAT) 0.4 MG SL tablet Place 1 tablet (0.4 mg total) under the tongue every 5 (five) minutes as needed for chest pain. 25 tablet 2  . oxyCODONE (OXY IR/ROXICODONE) 5 MG immediate release tablet Take 5 mg by mouth 3 (three) times daily as needed for severe pain (1 to 3 times daily as needed for pain.).    Marland Kitchen PARoxetine (PAXIL) 20 MG tablet Take 0.5 tablets (10 mg total) by mouth daily. Take 0.5 tablet by mouth twice daily  90 tablet 3  . polyethylene glycol (MIRALAX / GLYCOLAX) packet Take 17 g by mouth daily.    Marland Kitchen tiZANidine (ZANAFLEX) 4 MG tablet Take 4 mg by mouth 3 (three) times daily with meals as needed for muscle spasms (2 to 3 times daily.).     No current facility-administered medications for this visit.    No Known Allergies  Family History  Problem Relation Age of Onset  . Cancer Mother     lung  . Diabetes Father   . Heart disease Maternal Grandfather   . Diabetes Paternal Grandmother   . Cancer Paternal Grandfather     colon  . Stroke Neg Hx     History   Social History  . Marital Status: Single    Spouse Name: N/A    Number of Children: N/A  . Years of Education: N/A   Occupational History  . Not on file.   Social History Main Topics  . Smoking status: Former Research scientist (life sciences)  . Smokeless tobacco: Not on file  . Alcohol Use: No  . Drug Use:  No  . Sexual Activity: Yes   Other Topics Concern  . Not on file   Social History Narrative     Constitutional: Denies fever, malaise, fatigue, headache or abrupt weight changes.  Musculoskeletal: Pt reports low back pain and left leg weakness. . Denies muscle pain or joint pain and swelling.   Neurological: Denies dizziness or problems with balance and coordination.   No other specific complaints in a complete review of systems (except as listed in HPI above).   Objective:   Physical Exam  BP 110/70 mmHg  Pulse 63  Temp(Src) 98.4 F (36.9 C) (Oral)  Wt 213 lb (96.616 kg)  SpO2 98% Wt Readings from Last 3 Encounters:  06/09/14 213 lb (96.616 kg)  06/09/14 214 lb (97.07 kg)  06/01/14 215 lb (97.523 kg)    General: Appears her stated age, obese but well developed, well nourished in NAD. Cardiovascular: Normal rate and rhythm. S1,S2 noted.  No murmur, rubs or gallops noted.  Pulmonary/Chest: Normal effort and positive vesicular breath sounds. No respiratory distress. No wheezes, rales or ronchi noted.  Musculoskeletal: Decreased flexion, extension and lateral rotation of lumbar spine. Pain with palpation of the lumbar spine. Strength 4/5 LLE, 5/5 RLE. No difficulty with gait.  Neurological: Sensation intact to bilateral feet.  +DTRs bilaterally. Positive straight leg raise on the left.   BMET    Component Value Date/Time   NA 138 04/27/2014 1036   K 4.4 04/27/2014 1036   CL 102 04/27/2014 1036   CO2 29 04/27/2014 1036   GLUCOSE 146* 04/27/2014 1036   BUN 19 04/27/2014 1036   CREATININE 1.0 04/27/2014 1036   CALCIUM 9.3 04/27/2014 1036   GFRNONAA 63* 10/09/2013 0514   GFRAA 72* 10/09/2013 0514    Lipid Panel     Component Value Date/Time   CHOL 93 04/27/2014 1036   TRIG 113.0 04/27/2014 1036   HDL 29.00* 04/27/2014 1036   CHOLHDL 3 04/27/2014 1036   VLDL 22.6 04/27/2014 1036   LDLCALC 41 04/27/2014 1036    CBC    Component Value Date/Time   WBC 8.2  10/09/2013 0514   RBC 3.82* 10/09/2013 0514   RBC 3.47* 10/05/2013 0805   HGB 10.9* 10/09/2013 0514   HCT 31.9* 10/09/2013 0514   PLT 216 10/09/2013 0514   MCV 83.5 10/09/2013 0514   MCH 28.5 10/09/2013 0514   MCHC 34.2 10/09/2013 0514  RDW 13.5 10/09/2013 0514   LYMPHSABS 0.8 10/06/2013 0900   MONOABS 0.8 10/06/2013 0900   EOSABS 0.0 10/06/2013 0900   BASOSABS 0.0 10/06/2013 0900    Hgb A1C Lab Results  Component Value Date   HGBA1C 6.2 04/27/2014         Assessment & Plan:   Low back pain with sciatica of left side:  She is already on Lyrica and a pain pump She declines rX for steroids d/t diabetes We were not able to schedule lumbar MRI on the same day as her cervical MRI After discussion, she wants to proceed with cervical MRI then have her pain management doctor work up and order the lumbar MRI She will consider PT  RTC as needed or if symptoms persist or worsen

## 2014-06-09 NOTE — Patient Instructions (Signed)
Back Pain, Adult Low back pain is very common. About 1 in 5 people have back pain.The cause of low back pain is rarely dangerous. The pain often gets better over time.About half of people with a sudden onset of back pain feel better in just 2 weeks. About 8 in 10 people feel better by 6 weeks.  CAUSES Some common causes of back pain include:  Strain of the muscles or ligaments supporting the spine.  Wear and tear (degeneration) of the spinal discs.  Arthritis.  Direct injury to the back. DIAGNOSIS Most of the time, the direct cause of low back pain is not known.However, back pain can be treated effectively even when the exact cause of the pain is unknown.Answering your caregiver's questions about your overall health and symptoms is one of the most accurate ways to make sure the cause of your pain is not dangerous. If your caregiver needs more information, he or she may order lab work or imaging tests (X-rays or MRIs).However, even if imaging tests show changes in your back, this usually does not require surgery. HOME CARE INSTRUCTIONS For many people, back pain returns.Since low back pain is rarely dangerous, it is often a condition that people can learn to manageon their own.   Remain active. It is stressful on the back to sit or stand in one place. Do not sit, drive, or stand in one place for more than 30 minutes at a time. Take short walks on level surfaces as soon as pain allows.Try to increase the length of time you walk each day.  Do not stay in bed.Resting more than 1 or 2 days can delay your recovery.  Do not avoid exercise or work.Your body is made to move.It is not dangerous to be active, even though your back may hurt.Your back will likely heal faster if you return to being active before your pain is gone.  Pay attention to your body when you bend and lift. Many people have less discomfortwhen lifting if they bend their knees, keep the load close to their bodies,and  avoid twisting. Often, the most comfortable positions are those that put less stress on your recovering back.  Find a comfortable position to sleep. Use a firm mattress and lie on your side with your knees slightly bent. If you lie on your back, put a pillow under your knees.  Only take over-the-counter or prescription medicines as directed by your caregiver. Over-the-counter medicines to reduce pain and inflammation are often the most helpful.Your caregiver may prescribe muscle relaxant drugs.These medicines help dull your pain so you can more quickly return to your normal activities and healthy exercise.  Put ice on the injured area.  Put ice in a plastic bag.  Place a towel between your skin and the bag.  Leave the ice on for 15-20 minutes, 03-04 times a day for the first 2 to 3 days. After that, ice and heat may be alternated to reduce pain and spasms.  Ask your caregiver about trying back exercises and gentle massage. This may be of some benefit.  Avoid feeling anxious or stressed.Stress increases muscle tension and can worsen back pain.It is important to recognize when you are anxious or stressed and learn ways to manage it.Exercise is a great option. SEEK MEDICAL CARE IF:  You have pain that is not relieved with rest or medicine.  You have pain that does not improve in 1 week.  You have new symptoms.  You are generally not feeling well. SEEK   IMMEDIATE MEDICAL CARE IF:   You have pain that radiates from your back into your legs.  You develop new bowel or bladder control problems.  You have unusual weakness or numbness in your arms or legs.  You develop nausea or vomiting.  You develop abdominal pain.  You feel faint. Document Released: 06/18/2005 Document Revised: 12/18/2011 Document Reviewed: 10/20/2013 [employer] Patient Information 2015 [employer]. This information is not intended to replace advice given to you by your health care provider. Make sure you  discuss any questions you have with your health care provider.  

## 2014-06-09 NOTE — Progress Notes (Signed)
[facility]. [address], [address], [place]  [address] Phone: [phone] Fax:  [phone]  Date:  06/09/2014   ID:  Melinda [name], DOB 03/27/1956, MRN [id number]  PCP:  [name], NP   History of Present Illness: Melinda [name] is a 58 y.o. 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 Cardiac catheterization 2012 revealed patent LAD stent.   She underwent a nuclear stress test 2014 which demonstrated mild anterolateral ischemia with normal ejection fraction. This was a very similar finding asked to prior stress test in August of 2012 which resulted in catheterization in August of 2012 which showed patent LAD stent, DES. There was a diagonal branch jailed by stent with minor luminal irregularity at the ostium. EF was normal. Because of these symptoms, I initiated metoprolol extender release 25 mg to see if this would help.  She felt crummy. Fatigue. Unsure if it's her chronic fatigue, fibromyalgia. We decided to initiate isosorbide. She did feel some head rushing sensation she was bending over or wiping her dogs paws. Her heart rate also has not been increasing as well as usual. She also feels more fatigued.   On 10/09/12 I decided to discontinue her metoprolol 25 mg. On Nov 14, 2012 she is here for followup after discontinuation of beta blocker.  02/18/13 - Doing well. Rare palpitation off metop. Taking [name] class. Enjoy.  03/02/14 - Hospitalized with strep infection. Sepsis.  Echocardiogram reassuring. Infectious disease consultation. Much improved. Fever was as high as 105 she says.   Trying to avoid metformin.A1c 6.9. Lost weight. Eating well. Exercising.   06/09/14-had an episode of chest discomfort that was concerning to her. Prompted this office visit today. She had an episode of pain was 6/10, mid low sternum,  tried to stretch, sat down, pain started to lessen took one nitroglycerin with relief, no nausea or vomiting diaphoresis shortness of breath. She noted some pain in the right side of her neck when she bent over. Pounding in right side of neck. Felt like she did prior to stent.  She had another milder episode of chest pain previous evening. Nitroglycerin improved. She was concerned about the sore spot on her neck. It seems to hurt to touch but it is not enlarged.Walking loosing weight. Not under stress.     Wt Readings from Last 3 Encounters:  06/09/14 214 lb (97.07 kg)  06/01/14 215 lb (97.523 kg)  04/27/14 212 lb (96.163 kg)     Past Medical History  Diagnosis Date  . Degenerative disc disease   . Spinal stenosis   . Facet joint disease   . Bulging disc   . MRSA (methicillin resistant staph aureus) culture positive 2011    GREAT TOE RIGHT FOOT  . Fibromyalgia   . Peripheral neuropathy   . MCL deficiency, knee   . Coronary arteritis   . CAD (coronary artery disease) 2009    s/p stent to LAD  . Hyperlipidemia   . Heart disease   . 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 Chicken pox     Past Surgical History  Procedure Laterality Date  . Foot surgery Right     BIG TOE  . Back surgery      X 3  . Hand surgery Right   . Gallbladder surgery    .  Ablation      UTERUS  . Ablation      HEART  . Heart stent  2009    LAD  . Hand surgey Left   . Foot surgery Bilateral     PLANTAR FASCIITIS  . Foot surgery Right     2ND TOE  . Infusion pump implantation      X2 with morphine and baclofen  . Eye surgery Bilateral     Current Outpatient Prescriptions  Medication Sig Dispense Refill  . ACCU-CHEK SOFTCLIX LANCETS lancets 1-2 each by Other route daily. Use as instructed 100 each 2  . aspirin 81 MG tablet Take 81 mg by mouth daily.      Marland Kitchen BACLOFEN PO 134.81 mcg by Intrathecal route daily. Continuous infusion with morphine pump.    . Blood Glucose Calibration  (ACCU-CHEK AVIVA) SOLN 1 each by In Vitro route as needed. 1 each 1  . Blood Glucose Monitoring Suppl (ACCU-CHEK AVIVA PLUS) W/DEVICE KIT 1 Device by Does not apply route once. 1 kit 0  . bupivacaine (MARCAINE) 0.25 % injection 8.988 mg by Intrathecal route daily.    Marland Kitchen buPROPion (WELLBUTRIN XL) 150 MG 24 hr tablet Take 1 tablet (150 mg total) by mouth daily. 90 tablet 3  . CRESTOR 20 MG tablet TAKE 1 TABLET BY MOUTH DAILY 30 tablet 10  . fentaNYL 5,000 mcg by Intrathecal route daily. 134.81 mcg/day    . glucose blood (ACCU-CHEK AVIVA PLUS) test strip 1-2 each by Other route 2 (two) times daily as needed. 100 each 5  . isosorbide dinitrate (ISORDIL) 30 MG tablet Take 1 tablet (30 mg total) by mouth daily. 30 tablet 6  . LYRICA 100 MG capsule     . Multiple Vitamin (MULTIVITAMIN WITH MINERALS) TABS tablet Take 1 tablet by mouth daily.    Marland Kitchen oxyCODONE (OXY IR/ROXICODONE) 5 MG immediate release tablet Take 5 mg by mouth 3 (three) times daily as needed for severe pain (1 to 3 times daily as needed for pain.).    Marland Kitchen PARoxetine (PAXIL) 20 MG tablet Take 0.5 tablets (10 mg total) by mouth daily. Take 0.5 tablet by mouth twice daily 90 tablet 3  . polyethylene glycol (MIRALAX / GLYCOLAX) packet Take 17 g by mouth daily.    Marland Kitchen tiZANidine (ZANAFLEX) 4 MG tablet Take 4 mg by mouth 3 (three) times daily with meals as needed for muscle spasms (2 to 3 times daily.).     No current facility-administered medications for this visit.    Allergies:   No Known Allergies  Social History:  The patient  reports that she has quit smoking. She does not have any smokeless tobacco history on file. She reports that she does not drink alcohol or use illicit drugs.   Family History  Problem Relation Age of Onset  . Cancer Mother     lung  . Diabetes Father   . Heart disease Maternal Grandfather   . Diabetes Paternal Grandmother   . Cancer Paternal Grandfather     colon  . Stroke Neg Hx     ROS:  Please see the  history of present illness.   Denies any fevers, chills, orthopnea, PND, strokelike symptoms, shortness of breath   All other systems reviewed and negative.   PHYSICAL EXAM: VS:  BP 90/62 mmHg  Pulse 71  Ht 5\' 9"  (1.753 m)  Wt 214 lb (97.07 kg)  BMI 31.59 kg/m2 Well nourished, well developed, in no acute distress HEENT: normal, [place]/AT, EOMI  Neck: no JVD, normal carotid upstroke, no bruit, she was tender to palpation, pinpoint over carotid artery in mid neck. Cardiac:  normal S1, S2; RRR; no murmur Lungs:  clear to auscultation bilaterally, no wheezing, rhonchi or rales Abd: soft, nontender, no hepatomegaly, no bruits Ext: no edema, 2+ distal pulses Skin: warm and dry GU: deferred Neuro: no focal abnormalities noted, AAO x 3  EKG:  06/09/14-sinus rhythm, 71, no other ST segment changes.  ASSESSMENT AND PLAN:  1. Right-sided neck pain-she had a sensation of bulging in her neck, pain to palpation immediately over her carotid artery. This is likely either musculoskeletal or perhaps an inflammatory lymph node although I do not feel or palpate any lymph node in this region. I would like to check a carotid ultrasound to make sure that there is no anatomical change given new onset discomfort. 2. Coronary artery disease-stable. EKG unremarkable. Low-dose isosorbide. She has been off of beta blocker for quite some time secondary to fatigue. She is doing well today. Blood pressure too low to advance any other antianginal medications. 3. Angina-well-controlled. She does have atypical sharp stabbing chest pains at times. Noncardiac. Isosorbide. I wonder if this episode in her lower mid epigastric sternal region was also muscular skeletal. EKG reassuring. 4. Obesity-doing well with weight loss. She is almost down 10 pounds. She is trying to avoid medication for diabetes. Her last hemoglobin A1c was 6.9. She is striving for diet control.  5. Hyperlipidemia-currently on Crestor. Doing well. No myalgias. LDL  less than 70. Goal. 6. Will see in follow up, August 2015.  Signed, Candee Furbish, MD [employer] [facility] [employer] [facility]  06/09/2014 10:30 AM

## 2014-06-09 NOTE — Patient Instructions (Signed)
Your physician has requested that you have a carotid duplex. This test is an ultrasound of the carotid arteries in your neck. It looks at blood flow through these arteries that supply the brain with blood. Allow one hour for this exam. There are no restrictions or special instructions.  Your physician recommends that you continue on your current medications as directed. Please refer to the Current Medication list given to you today.  Your physician wants you to follow-up in: August 2016 with Dr Marlou Porch.  You will receive a reminder letter in the mail two months in advance. If you don't receive a letter, please call our office to schedule the follow-up appointment.

## 2014-06-09 NOTE — Progress Notes (Signed)
Pre visit review using our clinic review tool, if applicable. No additional management support is needed unless otherwise documented below in the visit note. 

## 2014-06-11 ENCOUNTER — Ambulatory Visit: Payer: Self-pay | Admitting: Internal Medicine

## 2014-06-11 ENCOUNTER — Encounter: Payer: Self-pay | Admitting: Internal Medicine

## 2014-06-11 DIAGNOSIS — M5031 Other cervical disc degeneration,  high cervical region: Secondary | ICD-10-CM | POA: Diagnosis not present

## 2014-06-11 DIAGNOSIS — M5032 Other cervical disc degeneration, mid-cervical region: Secondary | ICD-10-CM | POA: Diagnosis not present

## 2014-06-11 DIAGNOSIS — M4802 Spinal stenosis, cervical region: Secondary | ICD-10-CM | POA: Diagnosis not present

## 2014-06-11 DIAGNOSIS — M47892 Other spondylosis, cervical region: Secondary | ICD-10-CM | POA: Diagnosis not present

## 2014-06-15 ENCOUNTER — Telehealth: Payer: Self-pay

## 2014-06-15 ENCOUNTER — Ambulatory Visit (HOSPITAL_COMMUNITY): Payer: Medicare Other | Attending: Cardiology | Admitting: Cardiology

## 2014-06-15 DIAGNOSIS — I6523 Occlusion and stenosis of bilateral carotid arteries: Secondary | ICD-10-CM | POA: Diagnosis not present

## 2014-06-15 DIAGNOSIS — M542 Cervicalgia: Secondary | ICD-10-CM | POA: Insufficient documentation

## 2014-06-15 DIAGNOSIS — R42 Dizziness and giddiness: Secondary | ICD-10-CM | POA: Diagnosis not present

## 2014-06-15 NOTE — Telephone Encounter (Signed)
Pt left v/m requesting cb when MRI results of C spine available.

## 2014-06-15 NOTE — Progress Notes (Signed)
Carotid duplex performed 

## 2014-06-15 NOTE — Telephone Encounter (Signed)
I released this to her mychart with the message below:  Your MRI does show some worsening facet changes and stenosis. Do you want/need referral to a neurosurgeon?

## 2014-06-16 NOTE — Telephone Encounter (Signed)
Left detailed msg on VM per HIPAA  

## 2014-07-05 DIAGNOSIS — M4712 Other spondylosis with myelopathy, cervical region: Secondary | ICD-10-CM | POA: Diagnosis not present

## 2014-07-12 DIAGNOSIS — M5412 Radiculopathy, cervical region: Secondary | ICD-10-CM | POA: Diagnosis not present

## 2014-07-13 ENCOUNTER — Other Ambulatory Visit: Payer: Self-pay | Admitting: Orthopedic Surgery

## 2014-07-19 ENCOUNTER — Encounter (HOSPITAL_COMMUNITY): Payer: Self-pay | Admitting: Pharmacy Technician

## 2014-07-20 NOTE — Pre-Procedure Instructions (Signed)
KC PALMS  07/20/2014   Your procedure is scheduled on:  Wednesday, January 27th  Report to [facility] Admitting at 11 AM.  Call this number if you have problems the morning of surgery: [phone]   Remember:   Do not eat food or drink liquids after midnight.   Take these medicines the morning of surgery with A SIP OF WATER: wellbutrin, isosorbide, lyrica, paxil, oxycodone if needed   Do not wear jewelry, make-up or nail polish.  Do not wear lotions, powders, or perfumes, deodorant.  Do not shave 48 hours prior to surgery. Men may shave face and neck.  Do not bring valuables to the hospital.  [facility] is not responsible   for any belongings or valuables.               Contacts, dentures or bridgework may not be worn into surgery.  Leave suitcase in the car. After surgery it may be brought to your room.  For patients admitted to the hospital, discharge time is determined by your   treatment team.               Patients discharged the day of surgery will not be allowed to drive home.  Please read over the following fact sheets that you were given: Pain Booklet, Coughing and Deep Breathing, Blood Transfusion Information, MRSA Information and Surgical Site Infection Prevention  [facility] - Preparing for Surgery  Before surgery, you can play an important role.  Because skin is not sterile, your skin needs to be as free of germs as possible.  You can reduce the number of germs on you skin by washing with CHG (chlorahexidine gluconate) soap before surgery.  CHG is an antiseptic cleaner which kills germs and bonds with the skin to continue killing germs even after washing.  Please DO NOT use if you have an allergy to CHG or antibacterial soaps.  If your skin becomes reddened/irritated stop using the CHG and inform your nurse when you arrive at Short Stay.  Do not shave (including legs and underarms) for at least 48 hours prior to the first CHG shower.  You may shave your  face.  Please follow these instructions carefully:   1.  Shower with CHG Soap the night before surgery and the morning of Surgery.  2.  If you choose to wash your hair, wash your hair first as usual with your normal shampoo.  3.  After you shampoo, rinse your hair and body thoroughly to remove the shampoo.  4.  Use CHG as you would any other liquid soap.  You can apply CHG directly to the skin and wash gently with scrungie or a clean washcloth.  5.  Apply the CHG Soap to your body ONLY FROM THE NECK DOWN.  Do not use on open wounds or open sores.  Avoid contact with your eyes, ears, mouth and genitals (private parts).  Wash genitals (private parts) with your normal soap.  6.  Wash thoroughly, paying special attention to the area where your surgery will be performed.  7.  Thoroughly rinse your body with warm water from the neck down.  8.  DO NOT shower/wash with your normal soap after using and rinsing off the CHG Soap.  9.  Pat yourself dry with a clean towel.            10.  Wear clean pajamas.            11.  Place  clean sheets on your bed the night of your first shower and do not sleep with pets.  Day of Surgery  Do not apply any lotions/deoderants the morning of surgery.  Please wear clean clothes to the hospital/surgery center.

## 2014-07-21 ENCOUNTER — Encounter (HOSPITAL_COMMUNITY): Payer: Self-pay

## 2014-07-21 ENCOUNTER — Encounter (HOSPITAL_COMMUNITY)
Admission: RE | Admit: 2014-07-21 | Discharge: 2014-07-21 | Disposition: A | Discharge status: Home / Self Care | Payer: Medicare Other | Source: Ambulatory Visit | Attending: Orthopedic Surgery | Admitting: Orthopedic Surgery

## 2014-07-21 DIAGNOSIS — G4733 Obstructive sleep apnea (adult) (pediatric): Secondary | ICD-10-CM | POA: Insufficient documentation

## 2014-07-21 DIAGNOSIS — M9989 Other biomechanical lesions of abdomen and other regions: Secondary | ICD-10-CM | POA: Insufficient documentation

## 2014-07-21 DIAGNOSIS — Z955 Presence of coronary angioplasty implant and graft: Secondary | ICD-10-CM | POA: Diagnosis not present

## 2014-07-21 DIAGNOSIS — Z01810 Encounter for preprocedural cardiovascular examination: Secondary | ICD-10-CM | POA: Diagnosis not present

## 2014-07-21 DIAGNOSIS — E785 Hyperlipidemia, unspecified: Secondary | ICD-10-CM | POA: Diagnosis not present

## 2014-07-21 DIAGNOSIS — Z87891 Personal history of nicotine dependence: Secondary | ICD-10-CM | POA: Insufficient documentation

## 2014-07-21 DIAGNOSIS — Z01818 Encounter for other preprocedural examination: Secondary | ICD-10-CM | POA: Insufficient documentation

## 2014-07-21 DIAGNOSIS — Z8614 Personal history of Methicillin resistant Staphylococcus aureus infection: Secondary | ICD-10-CM | POA: Diagnosis not present

## 2014-07-21 DIAGNOSIS — I2510 Atherosclerotic heart disease of native coronary artery without angina pectoris: Secondary | ICD-10-CM | POA: Diagnosis not present

## 2014-07-21 HISTORY — DX: Anemia, unspecified: D64.9

## 2014-07-21 HISTORY — DX: Sleep apnea, unspecified: G47.30

## 2014-07-21 LAB — URINALYSIS, ROUTINE W REFLEX MICROSCOPIC
Bilirubin Urine: NEGATIVE
Glucose, UA: NEGATIVE mg/dL
Hgb urine dipstick: NEGATIVE
Ketones, ur: NEGATIVE mg/dL
Leukocytes, UA: NEGATIVE
Nitrite: NEGATIVE
Protein, ur: NEGATIVE mg/dL
Specific Gravity, Urine: 1.025 (ref 1.005–1.030)
Urobilinogen, UA: 0.2 mg/dL (ref 0.0–1.0)
pH: 6 (ref 5.0–8.0)

## 2014-07-21 LAB — CBC WITH DIFFERENTIAL/PLATELET
Basophils Absolute: 0 10*3/uL (ref 0.0–0.1)
Basophils Relative: 0 % (ref 0–1)
Eosinophils Absolute: 0.3 10*3/uL (ref 0.0–0.7)
Eosinophils Relative: 4 % (ref 0–5)
HCT: 37.8 % (ref 36.0–46.0)
Hemoglobin: 12.2 g/dL (ref 12.0–15.0)
Lymphocytes Relative: 24 % (ref 12–46)
Lymphs Abs: 1.7 10*3/uL (ref 0.7–4.0)
MCH: 27.7 pg (ref 26.0–34.0)
MCHC: 32.3 g/dL (ref 30.0–36.0)
MCV: 85.7 fL (ref 78.0–100.0)
Monocytes Absolute: 0.7 10*3/uL (ref 0.1–1.0)
Monocytes Relative: 9 % (ref 3–12)
Neutro Abs: 4.4 10*3/uL (ref 1.7–7.7)
Neutrophils Relative %: 63 % (ref 43–77)
Platelets: 234 10*3/uL (ref 150–400)
RBC: 4.41 MIL/uL (ref 3.87–5.11)
RDW: 13 % (ref 11.5–15.5)
WBC: 7.1 10*3/uL (ref 4.0–10.5)

## 2014-07-21 LAB — TYPE AND SCREEN
ABO/RH(D): O POS
Antibody Screen: NEGATIVE

## 2014-07-21 LAB — COMPREHENSIVE METABOLIC PANEL
ALT: 26 U/L (ref 0–35)
AST: 23 U/L (ref 0–37)
Albumin: 3.8 g/dL (ref 3.5–5.2)
Alkaline Phosphatase: 105 U/L (ref 39–117)
Anion gap: 9 (ref 5–15)
BUN: 18 mg/dL (ref 6–23)
CO2: 30 mmol/L (ref 19–32)
Calcium: 9.1 mg/dL (ref 8.4–10.5)
Chloride: 101 mEq/L (ref 96–112)
Creatinine, Ser: 0.79 mg/dL (ref 0.50–1.10)
GFR calc Af Amer: >90 mL/min (ref >90)
GFR calc non Af Amer: >90 mL/min (ref >90)
Glucose, Bld: 103 mg/dL — ABNORMAL HIGH (ref 70–99)
Potassium: 4.2 mmol/L (ref 3.5–5.1)
Sodium: 140 mmol/L (ref 135–145)
Total Bilirubin: 0.5 mg/dL (ref 0.3–1.2)
Total Protein: 7.1 g/dL (ref 6.0–8.3)

## 2014-07-21 LAB — APTT: aPTT: 37 seconds (ref 24–37)

## 2014-07-21 LAB — SURGICAL PCR SCREEN
MRSA, PCR: NEGATIVE
Staphylococcus aureus: POSITIVE — AB

## 2014-07-21 LAB — PROTIME-INR
INR: 1.12 (ref 0.00–1.49)
Prothrombin Time: 14.6 seconds (ref 11.6–15.2)

## 2014-07-21 LAB — ABO/RH: ABO/RH(D): O POS

## 2014-07-21 NOTE — Progress Notes (Signed)
Patient made aware of nasal swab results positive for staph, verbalized understanding of instructions.

## 2014-07-22 ENCOUNTER — Ambulatory Visit: Payer: Self-pay | Admitting: Pain Medicine

## 2014-07-22 ENCOUNTER — Encounter (HOSPITAL_COMMUNITY): Payer: Self-pay

## 2014-07-22 DIAGNOSIS — R51 Headache: Secondary | ICD-10-CM | POA: Diagnosis not present

## 2014-07-22 DIAGNOSIS — G894 Chronic pain syndrome: Secondary | ICD-10-CM | POA: Diagnosis not present

## 2014-07-22 DIAGNOSIS — M545 Low back pain: Secondary | ICD-10-CM | POA: Diagnosis not present

## 2014-07-22 DIAGNOSIS — M5116 Intervertebral disc disorders with radiculopathy, lumbar region: Secondary | ICD-10-CM | POA: Diagnosis not present

## 2014-07-22 DIAGNOSIS — M797 Fibromyalgia: Secondary | ICD-10-CM | POA: Diagnosis not present

## 2014-07-22 DIAGNOSIS — M25551 Pain in right hip: Secondary | ICD-10-CM | POA: Diagnosis not present

## 2014-07-22 DIAGNOSIS — M47898 Other spondylosis, sacral and sacrococcygeal region: Secondary | ICD-10-CM | POA: Diagnosis not present

## 2014-07-22 NOTE — Progress Notes (Addendum)
Anesthesia Chart Review:  Patient is a 59 year old female scheduled for C3-4. C4-5 ACDF on 07/28/14 by Dr. Lynann Bologna.  History includes former smoker, fibromyalgia, CAD s/p LAD DES '09, AVNRT s/p unsuccessful ablation '92, OSA with CPAP, MRSA (right foot) '11, HLD, peripheral neuropathy, multiple back surgeries including pain pump. PCP is Dr. [name], NP.  Cardiologist is Dr. Candee Furbish, last visit 06/09/14 for routine and chest pain follow-up. He felt her chest pain was non-cardiac.   Meds include ASA, Wellbutrin XL, Crestor, Isordil, Lyrica, Nitro, Paxil, oxycodone, Zanaflex.   06/09/14 EKG: NSR.  10/10/13 Echo: Left ventricle: The cavity size was normal. Systolic function was vigorous. The estimated ejection fraction was in the range of 65% to 70%. Wall motion was normal; therewere no regional wall motion abnormalities. Leftventricular diastolic function parameters were normal. Normal echo.  According to Dr. Marlou Porch' 06/09/14 office note, "She underwent a nuclear stress test 2014 which demonstrated mild anterolateral ischemia with normal ejection fraction. This was a very similar finding asked to prior stress test in August of 2012 which resulted in catheterization in August of 2012 which showed patent LAD stent, DES. There was a diagonal branch jailed by stent with minor luminal irregularity at the ostium. EF was normal. Because of these symptoms, I initiated metoprolol extender release 25 mg to see if this would help." (This was stopped on 10/09/12 due to fatigue.) Stress test report requested.  02/27/11 Cardiac Cath:  1. Previously placed mid LAD drug-eluting stent in 2009 is widely patent. There is only mild luminal irregularity at the diagonal branch, which is covered by the stent. Otherwise, widely patent coronary arteries. 2. Normal left ventricular ejection fraction of 60% with no wall motion abnormalities without any significant mitral regurgitation or aortic stenosis detected.  06/15/14  Carotid Duplex: Heterogeneous plaque, bilaterally. 60-79% bilateral ICA stenosis. > 50% RECA stenosis. Normal subclavian arteries, bilaterally. Patent vertebral arteries with antegrade flow. 6 month follow-up recommended.  10/04/13 CXR: No active cardiopulmonary disease.  Preoperative labs noted.  (A1C was 6.2 on 04/27/14.)  Carla at Dr. Lynann Bologna is faxing a request for cardiac clearance to Dr. Marlou Porch.  He saw patient last month and did not order any additional testing as he was not convinced her episode of chest pain was cardiac.  Her stress test in 2014 was mildly abnormal, but similar to 2012 stress test that was followed by a cath showing a patient LAD stent.  His note was reassuring, but will await his input.  George Hugh [facility] Short Stay Center/Anesthesiology Phone [phone] 07/22/2014 11:51 AM  Addendum:  Clearance received from Dr. Marlou Porch. He felt she was low to moderate cardiac risk. No restrictions given.  George Hugh [facility] Short Stay Center/Anesthesiology Phone [phone] 07/26/2014 10:13 AM

## 2014-07-26 ENCOUNTER — Telehealth: Payer: Self-pay | Admitting: Cardiology

## 2014-07-26 NOTE — Telephone Encounter (Signed)
Received request from Nurse fax box, documents faxed for surgical clearance. To: [facility] Fax number: [phone] Attention: 1.21.16/DC

## 2014-07-27 MED ORDER — CEFAZOLIN SODIUM-DEXTROSE 2-3 GM-% IV SOLR
2.0000 g | INTRAVENOUS | Status: AC
  Administered 2014-07-28: 2 g via INTRAVENOUS
  Filled 2014-07-27: qty 50

## 2014-07-27 MED ORDER — POVIDONE-IODINE 7.5 % EX SOLN
Freq: Once | CUTANEOUS | Status: DC
  Filled 2014-07-27: qty 118

## 2014-07-28 ENCOUNTER — Encounter (HOSPITAL_COMMUNITY)
Admission: RE | Disposition: A | Discharge status: Home / Self Care | Payer: Self-pay | Source: Ambulatory Visit | Attending: Orthopedic Surgery

## 2014-07-28 ENCOUNTER — Ambulatory Visit (HOSPITAL_COMMUNITY): Payer: Medicare Other | Admitting: Vascular Surgery

## 2014-07-28 ENCOUNTER — Inpatient Hospital Stay (HOSPITAL_COMMUNITY)
Admission: RE | Admit: 2014-07-28 | Discharge: 2014-07-29 | DRG: 473 | Disposition: A | Discharge status: Home / Self Care | Payer: Medicare Other | Source: Ambulatory Visit | Attending: Orthopedic Surgery | Admitting: Orthopedic Surgery

## 2014-07-28 ENCOUNTER — Ambulatory Visit (HOSPITAL_COMMUNITY): Payer: Medicare Other

## 2014-07-28 ENCOUNTER — Ambulatory Visit (HOSPITAL_COMMUNITY): Payer: Medicare Other | Admitting: Certified Registered"

## 2014-07-28 ENCOUNTER — Encounter (HOSPITAL_COMMUNITY): Payer: Self-pay | Admitting: Certified Registered"

## 2014-07-28 DIAGNOSIS — E785 Hyperlipidemia, unspecified: Secondary | ICD-10-CM | POA: Diagnosis present

## 2014-07-28 DIAGNOSIS — Z79891 Long term (current) use of opiate analgesic: Secondary | ICD-10-CM

## 2014-07-28 DIAGNOSIS — Z955 Presence of coronary angioplasty implant and graft: Secondary | ICD-10-CM | POA: Diagnosis not present

## 2014-07-28 DIAGNOSIS — I2510 Atherosclerotic heart disease of native coronary artery without angina pectoris: Secondary | ICD-10-CM | POA: Diagnosis not present

## 2014-07-28 DIAGNOSIS — Z7982 Long term (current) use of aspirin: Secondary | ICD-10-CM

## 2014-07-28 DIAGNOSIS — I2589 Other forms of chronic ischemic heart disease: Secondary | ICD-10-CM | POA: Diagnosis present

## 2014-07-28 DIAGNOSIS — D649 Anemia, unspecified: Secondary | ICD-10-CM | POA: Diagnosis not present

## 2014-07-28 DIAGNOSIS — Z87891 Personal history of nicotine dependence: Secondary | ICD-10-CM

## 2014-07-28 DIAGNOSIS — M4852XA Collapsed vertebra, not elsewhere classified, cervical region, initial encounter for fracture: Principal | ICD-10-CM | POA: Diagnosis present

## 2014-07-28 DIAGNOSIS — M797 Fibromyalgia: Secondary | ICD-10-CM | POA: Diagnosis not present

## 2014-07-28 DIAGNOSIS — M5412 Radiculopathy, cervical region: Secondary | ICD-10-CM | POA: Diagnosis not present

## 2014-07-28 DIAGNOSIS — Z4889 Encounter for other specified surgical aftercare: Secondary | ICD-10-CM | POA: Diagnosis not present

## 2014-07-28 DIAGNOSIS — M4802 Spinal stenosis, cervical region: Secondary | ICD-10-CM | POA: Diagnosis present

## 2014-07-28 DIAGNOSIS — Z79899 Other long term (current) drug therapy: Secondary | ICD-10-CM | POA: Diagnosis not present

## 2014-07-28 DIAGNOSIS — M4800 Spinal stenosis, site unspecified: Secondary | ICD-10-CM

## 2014-07-28 DIAGNOSIS — Z981 Arthrodesis status: Secondary | ICD-10-CM | POA: Diagnosis not present

## 2014-07-28 DIAGNOSIS — IMO0002 Reserved for concepts with insufficient information to code with codable children: Secondary | ICD-10-CM

## 2014-07-28 HISTORY — PX: ANTERIOR CERVICAL DECOMP/DISCECTOMY FUSION: SHX1161

## 2014-07-28 LAB — GLUCOSE, CAPILLARY
Glucose-Capillary: 97 mg/dL (ref 70–99)
Glucose-Capillary: 85 mg/dL (ref 70–99)
Glucose-Capillary: 84 mg/dL (ref 70–99)
Glucose-Capillary: 115 mg/dL — ABNORMAL HIGH (ref 70–99)
Glucose-Capillary: 159 mg/dL — ABNORMAL HIGH (ref 70–99)
Glucose-Capillary: 183 mg/dL — ABNORMAL HIGH (ref 70–99)

## 2014-07-28 SURGERY — ANTERIOR CERVICAL DECOMPRESSION/DISCECTOMY FUSION 3 LEVELS
Anesthesia: General | Site: Neck

## 2014-07-28 MED ORDER — ACETAMINOPHEN 650 MG RE SUPP: 650.0000 mg | RECTAL | Status: DC | PRN

## 2014-07-28 MED ORDER — LIDOCAINE HCL (CARDIAC) 20 MG/ML IV SOLN
INTRAVENOUS | Status: DC | PRN
  Administered 2014-07-28: 60 mg via INTRAVENOUS

## 2014-07-28 MED ORDER — OXYCODONE HCL 5 MG PO TABS
ORAL_TABLET | ORAL | Status: AC
  Filled 2014-07-28: qty 1

## 2014-07-28 MED ORDER — ROSUVASTATIN CALCIUM 20 MG PO TABS
20.0000 mg | ORAL_TABLET | Freq: Every day | ORAL | Status: DC
  Administered 2014-07-28 – 2014-07-29 (×2): 20 mg via ORAL
  Filled 2014-07-28 (×2): qty 1

## 2014-07-28 MED ORDER — BUPIVACAINE-EPINEPHRINE 0.25% -1:200000 IJ SOLN
INTRAMUSCULAR | Status: DC | PRN
  Administered 2014-07-28: 10 mL

## 2014-07-28 MED ORDER — OXYCODONE-ACETAMINOPHEN 5-325 MG PO TABS
1.0000 | ORAL_TABLET | ORAL | Status: DC | PRN
  Administered 2014-07-28 – 2014-07-29 (×3): 2 via ORAL
  Filled 2014-07-28 (×3): qty 2

## 2014-07-28 MED ORDER — HYDROCODONE-ACETAMINOPHEN 5-325 MG PO TABS: 1.0000 | ORAL_TABLET | ORAL | Status: DC | PRN

## 2014-07-28 MED ORDER — BUPROPION HCL ER (XL) 150 MG PO TB24
150.0000 mg | ORAL_TABLET | Freq: Every day | ORAL | Status: DC
  Administered 2014-07-28 – 2014-07-29 (×2): 150 mg via ORAL
  Filled 2014-07-28 (×2): qty 1

## 2014-07-28 MED ORDER — MIDAZOLAM HCL 5 MG/5ML IJ SOLN
INTRAMUSCULAR | Status: DC | PRN
  Administered 2014-07-28 (×2): 2 mg via INTRAVENOUS

## 2014-07-28 MED ORDER — CEFAZOLIN SODIUM 1-5 GM-% IV SOLN
1.0000 g | Freq: Three times a day (TID) | INTRAVENOUS | Status: AC
  Administered 2014-07-28 – 2014-07-29 (×2): 1 g via INTRAVENOUS
  Filled 2014-07-28 (×2): qty 50

## 2014-07-28 MED ORDER — KETOROLAC TROMETHAMINE 0.5 % OP SOLN
1.0000 [drp] | Freq: Four times a day (QID) | OPHTHALMIC | Status: DC
  Administered 2014-07-28 – 2014-07-29 (×2): 1 [drp] via OPHTHALMIC
  Filled 2014-07-28: qty 3

## 2014-07-28 MED ORDER — DIAZEPAM 5 MG PO TABS
5.0000 mg | ORAL_TABLET | Freq: Four times a day (QID) | ORAL | Status: DC | PRN
  Administered 2014-07-28 – 2014-07-29 (×2): 5 mg via ORAL
  Filled 2014-07-28: qty 1

## 2014-07-28 MED ORDER — PROMETHAZINE HCL 25 MG/ML IJ SOLN: 6.2500 mg | INTRAMUSCULAR | Status: DC | PRN

## 2014-07-28 MED ORDER — DIAZEPAM 5 MG PO TABS
ORAL_TABLET | ORAL | Status: AC
  Filled 2014-07-28: qty 1

## 2014-07-28 MED ORDER — ACETAMINOPHEN 325 MG PO TABS: 650.0000 mg | ORAL_TABLET | ORAL | Status: DC | PRN

## 2014-07-28 MED ORDER — MORPHINE SULFATE 2 MG/ML IJ SOLN
1.0000 mg | INTRAMUSCULAR | Status: DC | PRN
  Administered 2014-07-28: 4 mg via INTRAVENOUS
  Administered 2014-07-29: 2 mg via INTRAVENOUS
  Filled 2014-07-28: qty 2
  Filled 2014-07-28: qty 1

## 2014-07-28 MED ORDER — LACTATED RINGERS IV SOLN
INTRAVENOUS | Status: DC
  Administered 2014-07-28 (×2): via INTRAVENOUS

## 2014-07-28 MED ORDER — SENNOSIDES-DOCUSATE SODIUM 8.6-50 MG PO TABS
1.0000 | ORAL_TABLET | Freq: Every evening | ORAL | Status: DC | PRN
  Filled 2014-07-28: qty 1

## 2014-07-28 MED ORDER — ISOSORBIDE DINITRATE 30 MG PO TABS
30.0000 mg | ORAL_TABLET | Freq: Every day | ORAL | Status: DC
  Administered 2014-07-29: 30 mg via ORAL
  Filled 2014-07-28: qty 1

## 2014-07-28 MED ORDER — PHENOL 1.4 % MT LIQD
1.0000 | OROMUCOSAL | Status: DC | PRN
  Filled 2014-07-28: qty 177

## 2014-07-28 MED ORDER — ALUM & MAG HYDROXIDE-SIMETH 200-200-20 MG/5ML PO SUSP: 30.0000 mL | Freq: Four times a day (QID) | ORAL | Status: DC | PRN

## 2014-07-28 MED ORDER — ROCURONIUM BROMIDE 100 MG/10ML IV SOLN
INTRAVENOUS | Status: DC | PRN
  Administered 2014-07-28: 50 mg via INTRAVENOUS
  Administered 2014-07-28 (×2): 10 mg via INTRAVENOUS
  Administered 2014-07-28: 20 mg via INTRAVENOUS
  Administered 2014-07-28: 10 mg via INTRAVENOUS

## 2014-07-28 MED ORDER — KETAMINE HCL 100 MG/ML IJ SOLN
INTRAMUSCULAR | Status: AC
  Filled 2014-07-28: qty 1

## 2014-07-28 MED ORDER — BISACODYL 5 MG PO TBEC
5.0000 mg | DELAYED_RELEASE_TABLET | Freq: Every day | ORAL | Status: DC | PRN
  Filled 2014-07-28: qty 1

## 2014-07-28 MED ORDER — BUPIVACAINE-EPINEPHRINE (PF) 0.25% -1:200000 IJ SOLN
INTRAMUSCULAR | Status: AC
  Filled 2014-07-28: qty 30

## 2014-07-28 MED ORDER — HYDROMORPHONE HCL 1 MG/ML IJ SOLN
INTRAMUSCULAR | Status: AC
  Filled 2014-07-28: qty 1

## 2014-07-28 MED ORDER — ONDANSETRON HCL 4 MG/2ML IJ SOLN
INTRAMUSCULAR | Status: DC | PRN
  Administered 2014-07-28: 4 mg via INTRAVENOUS

## 2014-07-28 MED ORDER — FENTANYL CITRATE 0.05 MG/ML IJ SOLN
INTRAMUSCULAR | Status: AC
  Filled 2014-07-28: qty 5

## 2014-07-28 MED ORDER — MIDAZOLAM HCL 2 MG/2ML IJ SOLN
INTRAMUSCULAR | Status: AC
  Filled 2014-07-28: qty 2

## 2014-07-28 MED ORDER — PHENYLEPHRINE HCL 10 MG/ML IJ SOLN
INTRAMUSCULAR | Status: DC | PRN
  Administered 2014-07-28 (×3): 80 ug via INTRAVENOUS

## 2014-07-28 MED ORDER — GLYCOPYRROLATE 0.2 MG/ML IJ SOLN
INTRAMUSCULAR | Status: AC
  Filled 2014-07-28: qty 2

## 2014-07-28 MED ORDER — PAROXETINE HCL 10 MG PO TABS
10.0000 mg | ORAL_TABLET | Freq: Every day | ORAL | Status: DC
  Administered 2014-07-28 – 2014-07-29 (×2): 10 mg via ORAL
  Filled 2014-07-28 (×2): qty 1

## 2014-07-28 MED ORDER — THROMBIN 20000 UNITS EX SOLR
CUTANEOUS | Status: AC
  Filled 2014-07-28: qty 40000

## 2014-07-28 MED ORDER — SODIUM CHLORIDE 0.9 % IJ SOLN: 3.0000 mL | INTRAMUSCULAR | Status: DC | PRN

## 2014-07-28 MED ORDER — ONDANSETRON HCL 4 MG/2ML IJ SOLN: 4.0000 mg | INTRAMUSCULAR | Status: DC | PRN

## 2014-07-28 MED ORDER — PROPOFOL 10 MG/ML IV BOLUS
INTRAVENOUS | Status: DC | PRN
  Administered 2014-07-28: 100 mg via INTRAVENOUS

## 2014-07-28 MED ORDER — NEOSTIGMINE METHYLSULFATE 10 MG/10ML IV SOLN
INTRAVENOUS | Status: DC | PRN
  Administered 2014-07-28: 3 mg via INTRAVENOUS

## 2014-07-28 MED ORDER — SODIUM CHLORIDE 0.9 % IJ SOLN: 3.0000 mL | Freq: Two times a day (BID) | INTRAMUSCULAR | Status: DC

## 2014-07-28 MED ORDER — ARTIFICIAL TEARS OP OINT
TOPICAL_OINTMENT | OPHTHALMIC | Status: AC
  Filled 2014-07-28: qty 3.5

## 2014-07-28 MED ORDER — ACETAMINOPHEN 10 MG/ML IV SOLN
INTRAVENOUS | Status: DC | PRN
  Administered 2014-07-28: 1000 mg via INTRAVENOUS

## 2014-07-28 MED ORDER — OXYCODONE HCL 5 MG/5ML PO SOLN: 5.0000 mg | Freq: Once | ORAL | Status: AC | PRN

## 2014-07-28 MED ORDER — NEOSTIGMINE METHYLSULFATE 10 MG/10ML IV SOLN
INTRAVENOUS | Status: AC
  Filled 2014-07-28: qty 1

## 2014-07-28 MED ORDER — GLYCOPYRROLATE 0.2 MG/ML IJ SOLN
INTRAMUSCULAR | Status: DC | PRN
  Administered 2014-07-28: 0.4 mg via INTRAVENOUS

## 2014-07-28 MED ORDER — EPHEDRINE SULFATE 50 MG/ML IJ SOLN
INTRAMUSCULAR | Status: DC | PRN
  Administered 2014-07-28 (×2): 5 mg via INTRAVENOUS

## 2014-07-28 MED ORDER — DEXAMETHASONE SODIUM PHOSPHATE 4 MG/ML IJ SOLN
INTRAMUSCULAR | Status: DC | PRN
  Administered 2014-07-28: 8 mg via INTRAVENOUS

## 2014-07-28 MED ORDER — NITROGLYCERIN 0.4 MG SL SUBL: 0.4000 mg | SUBLINGUAL_TABLET | SUBLINGUAL | Status: DC | PRN

## 2014-07-28 MED ORDER — ACETAMINOPHEN 10 MG/ML IV SOLN
INTRAVENOUS | Status: AC
  Filled 2014-07-28: qty 100

## 2014-07-28 MED ORDER — ZOLPIDEM TARTRATE 5 MG PO TABS: 5.0000 mg | ORAL_TABLET | Freq: Every evening | ORAL | Status: DC | PRN

## 2014-07-28 MED ORDER — 0.9 % SODIUM CHLORIDE (POUR BTL) OPTIME
TOPICAL | Status: DC | PRN
  Administered 2014-07-28: 1000 mL

## 2014-07-28 MED ORDER — DEXTROSE 50 % IV SOLN
INTRAVENOUS | Status: AC
  Administered 2014-07-28: 18 mL via INTRAVENOUS
  Filled 2014-07-28: qty 50

## 2014-07-28 MED ORDER — HYDROMORPHONE HCL 1 MG/ML IJ SOLN
0.2500 mg | INTRAMUSCULAR | Status: DC | PRN
  Administered 2014-07-28 (×4): 0.5 mg via INTRAVENOUS

## 2014-07-28 MED ORDER — FLEET ENEMA 7-19 GM/118ML RE ENEM
1.0000 | ENEMA | Freq: Once | RECTAL | Status: AC | PRN
  Filled 2014-07-28: qty 1

## 2014-07-28 MED ORDER — PROPOFOL 10 MG/ML IV BOLUS
INTRAVENOUS | Status: AC
  Filled 2014-07-28: qty 20

## 2014-07-28 MED ORDER — OXYCODONE HCL 5 MG PO TABS
5.0000 mg | ORAL_TABLET | Freq: Once | ORAL | Status: AC | PRN
  Administered 2014-07-28: 5 mg via ORAL

## 2014-07-28 MED ORDER — PREGABALIN 100 MG PO CAPS
100.0000 mg | ORAL_CAPSULE | Freq: Two times a day (BID) | ORAL | Status: DC
  Administered 2014-07-28 – 2014-07-29 (×2): 100 mg via ORAL
  Filled 2014-07-28 (×2): qty 1

## 2014-07-28 MED ORDER — POLYETHYLENE GLYCOL 3350 17 G PO PACK
17.0000 g | PACK | Freq: Every day | ORAL | Status: DC
  Administered 2014-07-28 – 2014-07-29 (×2): 17 g via ORAL
  Filled 2014-07-28 (×2): qty 1

## 2014-07-28 MED ORDER — DOCUSATE SODIUM 100 MG PO CAPS
100.0000 mg | ORAL_CAPSULE | Freq: Two times a day (BID) | ORAL | Status: DC
  Administered 2014-07-28 – 2014-07-29 (×2): 100 mg via ORAL
  Filled 2014-07-28 (×2): qty 1

## 2014-07-28 MED ORDER — SODIUM CHLORIDE 0.9 % IV SOLN: 250.0000 mL | INTRAVENOUS | Status: DC

## 2014-07-28 MED ORDER — FENTANYL CITRATE 0.05 MG/ML IJ SOLN
INTRAMUSCULAR | Status: DC | PRN
  Administered 2014-07-28 (×2): 50 ug via INTRAVENOUS
  Administered 2014-07-28: 100 ug via INTRAVENOUS
  Administered 2014-07-28: 50 ug via INTRAVENOUS
  Administered 2014-07-28: 100 ug via INTRAVENOUS
  Administered 2014-07-28: 50 ug via INTRAVENOUS

## 2014-07-28 MED ORDER — MENTHOL 3 MG MT LOZG: 1.0000 | LOZENGE | OROMUCOSAL | Status: DC | PRN

## 2014-07-28 MED ORDER — THROMBIN 20000 UNITS EX KIT
PACK | CUTANEOUS | Status: DC | PRN
  Administered 2014-07-28: 20 mL via TOPICAL

## 2014-07-28 MED ORDER — HYDROMORPHONE HCL 1 MG/ML IJ SOLN
0.5000 mg | INTRAMUSCULAR | Status: DC | PRN
  Administered 2014-07-28: 0.5 mg via INTRAVENOUS

## 2014-07-28 MED ORDER — KETAMINE HCL 100 MG/ML IJ SOLN
INTRAMUSCULAR | Status: DC | PRN
  Administered 2014-07-28: 10 mg via INTRAVENOUS
  Administered 2014-07-28: 30 mg via INTRAVENOUS
  Administered 2014-07-28 (×3): 20 mg via INTRAVENOUS

## 2014-07-28 MED ORDER — THROMBIN 20000 UNITS EX KIT: PACK | CUTANEOUS | Status: DC | PRN

## 2014-07-28 SURGICAL SUPPLY — 84 items
ACIS 14MM SCREW ×2 IMPLANT
ADH SKN CLS LQ APL DERMABOND (GAUZE/BANDAGES/DRESSINGS) ×1
APL SKNCLS STERI-STRIP NONHPOA (GAUZE/BANDAGES/DRESSINGS) ×1
BENZOIN TINCTURE PRP APPL 2/3 (GAUZE/BANDAGES/DRESSINGS) ×2 IMPLANT
BIT DRILL NEURO 2X3.1 SFT TUCH (MISCELLANEOUS) ×1 IMPLANT
BIT DRILL SRG 14X2.2XFLT CHK (BIT) IMPLANT
BIT DRL SRG 14X2.2XFLT CHK (BIT) ×1
BLADE SURG 15 STRL LF DISP TIS (BLADE) ×1 IMPLANT
BLADE SURG 15 STRL SS (BLADE) ×2
BLADE SURG ROTATE 9660 (MISCELLANEOUS) ×2 IMPLANT
BUR MATCHSTICK NEURO 3.0 LAGG (BURR) IMPLANT
CARTRIDGE OIL MAESTRO DRILL (MISCELLANEOUS) ×1 IMPLANT
CLSR STERI-STRIP ANTIMIC 1/2X4 (GAUZE/BANDAGES/DRESSINGS) ×1 IMPLANT
CORDS BIPOLAR (ELECTRODE) ×2 IMPLANT
COVER SURGICAL LIGHT HANDLE (MISCELLANEOUS) ×2 IMPLANT
CRADLE DONUT ADULT HEAD (MISCELLANEOUS) ×2 IMPLANT
DERMABOND ADHESIVE PROPEN (GAUZE/BANDAGES/DRESSINGS) ×1
DERMABOND ADVANCED .7 DNX6 (GAUZE/BANDAGES/DRESSINGS) IMPLANT
DIFFUSER DRILL AIR PNEUMATIC (MISCELLANEOUS) ×2 IMPLANT
DRAIN JACKSON RD 7FR 3/32 (WOUND CARE) IMPLANT
DRAPE C-ARM 42X72 X-RAY (DRAPES) ×2 IMPLANT
DRAPE POUCH INSTRU U-SHP 10X18 (DRAPES) ×2 IMPLANT
DRAPE SURG 17X23 STRL (DRAPES) ×6 IMPLANT
DRILL BIT SKYLINE 14MM (BIT) ×2
DRILL NEURO 2X3.1 SOFT TOUCH (MISCELLANEOUS) ×2
DURAPREP 26ML APPLICATOR (WOUND CARE) ×2 IMPLANT
ELECT COATED BLADE 2.86 ST (ELECTRODE) ×2 IMPLANT
ELECT REM PT RETURN 9FT ADLT (ELECTROSURGICAL) ×2
ELECTRODE REM PT RTRN 9FT ADLT (ELECTROSURGICAL) ×1 IMPLANT
EVACUATOR SILICONE 100CC (DRAIN) IMPLANT
GAUZE SPONGE 4X4 12PLY STRL (GAUZE/BANDAGES/DRESSINGS) ×2 IMPLANT
GAUZE SPONGE 4X4 16PLY XRAY LF (GAUZE/BANDAGES/DRESSINGS) ×2 IMPLANT
GLOVE BIO SURGEON STRL SZ7 (GLOVE) ×2 IMPLANT
GLOVE BIO SURGEON STRL SZ8 (GLOVE) ×2 IMPLANT
GLOVE BIOGEL PI IND STRL 7.0 (GLOVE) ×2 IMPLANT
GLOVE BIOGEL PI IND STRL 8 (GLOVE) ×1 IMPLANT
GLOVE BIOGEL PI INDICATOR 7.0 (GLOVE) ×2
GLOVE BIOGEL PI INDICATOR 8 (GLOVE) ×1
GOWN STRL REUS W/ TWL LRG LVL3 (GOWN DISPOSABLE) ×1 IMPLANT
GOWN STRL REUS W/ TWL XL LVL3 (GOWN DISPOSABLE) ×1 IMPLANT
GOWN STRL REUS W/TWL LRG LVL3 (GOWN DISPOSABLE) ×2
GOWN STRL REUS W/TWL XL LVL3 (GOWN DISPOSABLE) ×2
IMPL S ENDOSKEL TC 7 ODEG (Orthopedic Implant) IMPLANT
IMPL S ENDOSKEL TC 8MM ODEG (Orthopedic Implant) IMPLANT
IMPLANT S ENDOSKEL TC 7 ODEG (Orthopedic Implant) ×2 IMPLANT
IMPLANT S ENDOSKEL TC 8MM ODEG (Orthopedic Implant) ×2 IMPLANT
INTERLOCK LRDTC CRVCL VBR 7MM (Bone Implant) IMPLANT
IV CATH 14GX2 1/4 (CATHETERS) ×2 IMPLANT
KIT BASIN OR (CUSTOM PROCEDURE TRAY) ×2 IMPLANT
KIT ROOM TURNOVER OR (KITS) ×2 IMPLANT
LORDOTIC CERVICAL VBR 7MM SM (Bone Implant) ×2 IMPLANT
MANIFOLD NEPTUNE II (INSTRUMENTS) ×1 IMPLANT
NDL SPNL 20GX3.5 QUINCKE YW (NEEDLE) ×1 IMPLANT
NEEDLE 27GAX1X1/2 (NEEDLE) ×2 IMPLANT
NEEDLE SPNL 20GX3.5 QUINCKE YW (NEEDLE) ×2 IMPLANT
NS IRRIG 1000ML POUR BTL (IV SOLUTION) ×2 IMPLANT
OIL CARTRIDGE MAESTRO DRILL (MISCELLANEOUS) ×2
PACK ORTHO CERVICAL (CUSTOM PROCEDURE TRAY) ×2 IMPLANT
PAD ARMBOARD 7.5X6 YLW CONV (MISCELLANEOUS) ×4 IMPLANT
PATTIES SURGICAL .5 X.5 (GAUZE/BANDAGES/DRESSINGS) IMPLANT
PATTIES SURGICAL .5 X1 (DISPOSABLE) IMPLANT
PIN DISTRACTION 14 (PIN) ×2 IMPLANT
PLATE SKYLINE 3 LVL 48MM (Plate) ×1 IMPLANT
PUTTY BONE DBX 5CC MIX (Putty) ×1 IMPLANT
SCREW VAR SELF TAP SKYLINE 14M (Screw) ×8 IMPLANT
SPONGE GAUZE 4X4 12PLY STER LF (GAUZE/BANDAGES/DRESSINGS) ×1 IMPLANT
SPONGE INTESTINAL PEANUT (DISPOSABLE) ×2 IMPLANT
SPONGE SURGIFOAM ABS GEL 100 (HEMOSTASIS) ×1 IMPLANT
STRIP CLOSURE SKIN 1/2X4 (GAUZE/BANDAGES/DRESSINGS) ×2 IMPLANT
SURGIFLO TRUKIT (HEMOSTASIS) ×1 IMPLANT
SUT MNCRL AB 4-0 PS2 18 (SUTURE) IMPLANT
SUT SILK 4 0 (SUTURE)
SUT SILK 4-0 18XBRD TIE 12 (SUTURE) IMPLANT
SUT VIC AB 2-0 CT2 18 VCP726D (SUTURE) ×2 IMPLANT
SYR BULB IRRIGATION 50ML (SYRINGE) ×2 IMPLANT
SYR CONTROL 10ML LL (SYRINGE) ×2 IMPLANT
TAPE CLOTH 4X10 WHT NS (GAUZE/BANDAGES/DRESSINGS) ×2 IMPLANT
TAPE CLOTH SURG 4X10 WHT LF (GAUZE/BANDAGES/DRESSINGS) ×1 IMPLANT
TAPE UMBILICAL COTTON 1/8X30 (MISCELLANEOUS) ×5 IMPLANT
TOWEL OR 17X24 6PK STRL BLUE (TOWEL DISPOSABLE) ×2 IMPLANT
TOWEL OR 17X26 10 PK STRL BLUE (TOWEL DISPOSABLE) ×2 IMPLANT
TRAY FOLEY CATH 16FRSI W/METER (SET/KITS/TRAYS/PACK) ×2 IMPLANT
WATER STERILE IRR 1000ML POUR (IV SOLUTION) ×2 IMPLANT
YANKAUER SUCT BULB TIP NO VENT (SUCTIONS) ×2 IMPLANT

## 2014-07-28 NOTE — Anesthesia Preprocedure Evaluation (Addendum)
Anesthesia Evaluation  Patient identified by MRN, date of birth, ID band Patient awake    Reviewed: Allergy & Precautions, NPO status , Patient's Chart, lab work & pertinent test results  Airway Mallampati: II  TM Distance: >3 FB Neck ROM: Full    Dental  (+) Teeth Intact   Pulmonary sleep apnea and Continuous Positive Airway Pressure Ventilation , former smoker,          Cardiovascular + angina + CAD and + Cardiac Stents + dysrhythmias Supra Ventricular Tachycardia     Neuro/Psych  Neuromuscular disease    GI/Hepatic negative GI ROS, Neg liver ROS,   Endo/Other  diabetes, Type 2  Renal/GU negative Renal ROS     Musculoskeletal  (+) Arthritis -, Fibromyalgia -, narcotic dependent  Abdominal   Peds  Hematology negative hematology ROS (+)   Anesthesia Other Findings   Reproductive/Obstetrics                            Anesthesia Physical Anesthesia Plan  ASA: III  Anesthesia Plan: General   Post-op Pain Management:    Induction: Intravenous  Airway Management Planned: Oral ETT  Additional Equipment:   Intra-op Plan:   Post-operative Plan: Extubation in OR  Informed Consent: I have reviewed the patients History and Physical, chart, labs and discussed the procedure including the risks, benefits and alternatives for the proposed anesthesia with the patient or authorized representative who has indicated his/her understanding and acceptance.     Plan Discussed with: CRNA  Anesthesia Plan Comments:         Anesthesia Quick Evaluation

## 2014-07-28 NOTE — Progress Notes (Signed)
Set up CPAP for patient, however not ready to go on at this time. Pt told RT that she could place herself on and off throughout the night. RT informed pt to call for RT if she needs any help during the night.

## 2014-07-28 NOTE — Progress Notes (Signed)
Orthopedic Tech Progress Note Patient Details:  Melinda [name] 07/21/1955 [id number] Delivered [place] cervical collar to pt.'s nurse. Ortho Devices Type of Ortho Device: [place] cervical collar Ortho Device/Splint Interventions: Other (comment)   Darrol Poke 07/28/2014, 9:12 PM

## 2014-07-28 NOTE — Anesthesia Procedure Notes (Signed)
Procedure Name: Intubation Date/Time: 07/28/2014 2:52 PM Performed by: Julian Reil Pre-anesthesia Checklist: Patient identified, Emergency Drugs available, Suction available and Patient being monitored Patient Re-evaluated:Patient Re-evaluated prior to inductionOxygen Delivery Method: Circle system utilized Preoxygenation: Pre-oxygenation with 100% oxygen Intubation Type: IV induction Ventilation: Mask ventilation without difficulty and Oral airway inserted - appropriate to patient size Laryngoscope Size: Mac and 3 Grade View: Grade II Tube type: Oral Tube size: 7.5 mm Number of attempts: 1 Airway Equipment and Method: Stylet Placement Confirmation: ETT inserted through vocal cords under direct vision,  positive ETCO2 and breath sounds checked- equal and bilateral Secured at: 21 cm Tube secured with: Tape Dental Injury: Teeth and Oropharynx as per pre-operative assessment

## 2014-07-28 NOTE — Transfer of Care (Signed)
Immediate Anesthesia Transfer of Care Note  Patient: [place]  Procedure(s) Performed: Procedure(s) with comments: ANTERIOR CERVICAL DECOMPRESSION/DISCECTOMY FUSION CERVICAL 3-4,4-5,5-6 LEVELS WITH INSTRUMENTATION AND ALLOGRAFT (N/A) - Anterior cervical decompression fusion, cervical 3-4, cervical 4-5, cervical 5-6 with instrumentation and allograft  Patient Location: PACU  Anesthesia Type:General  Level of Consciousness: awake  Airway & Oxygen Therapy: Patient Spontanous Breathing and Patient connected to nasal cannula oxygen  Post-op Assessment: Report given to PACU RN, Post -op Vital signs reviewed and stable and Patient moving all extremities  Post vital signs: Reviewed and stable  Last Vitals:  Filed Vitals:   07/28/14 1058  BP: 128/70  Pulse: 74  Temp: 36.9 C  Resp: 18    Complications: No apparent anesthesia complications

## 2014-07-28 NOTE — H&P (Signed)
PREOPERATIVE H&P  Chief Complaint: bilateral arm pain  HPI: Melinda [name] is a 59 y.o. female who presents with ongoing pain in the bilateral arms  MRI reveals NF stenosis C3-C6  Patient has failed multiple forms of conservative care and continues to have pain (see office notes for additional details regarding the patient's full course of treatment)  Past Medical History  Diagnosis Date  . Degenerative disc disease   . Spinal stenosis   . Facet joint disease   . Bulging disc   . MRSA (methicillin resistant staph aureus) culture positive 2011    GREAT TOE RIGHT FOOT  . Fibromyalgia   . Peripheral neuropathy   . MCL deficiency, knee   . Coronary arteritis   . CAD (coronary artery disease) 2009    s/p stent to LAD  . Hyperlipidemia   . Heart disease   . 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 Chicken pox   . Anemia     years ago  . Sleep apnea     uses CPAP, sleep study at [facility]   Past Surgical History  Procedure Laterality Date  . Foot surgery Right     BIG TOE  . Hand surgery Right   . Gallbladder surgery    . Ablation      UTERUS  . Ablation      HEART  . Heart stent  2009    LAD  . Hand surgey Left   . Foot surgery Bilateral     PLANTAR FASCIITIS  . Foot surgery Right     2ND TOE  . Infusion pump implantation      X2 with morphine and baclofen  . Eye surgery Bilateral   . Back surgery      X 3 1979, 1994, 1995  . Fracture surgery Right 2012    carpal tunnel  . Cholecystectomy  2003   History   Social History  . Marital Status: Single    Spouse Name: N/A    Number of Children: N/A  . Years of Education: N/A   Social History Main Topics  . Smoking status: Former Smoker -- 1.50 packs/day for 32 years    Types: Cigarettes    Quit date: 07/22/2007  . Smokeless tobacco: Never Used  . Alcohol Use: No  . Drug Use: No  . Sexual Activity: Yes   Other Topics Concern  . Not on file   Social History Narrative    Family History  Problem Relation Age of Onset  . Cancer Mother     lung  . Diabetes Father   . Heart disease Maternal Grandfather   . Diabetes Paternal Grandmother   . Cancer Paternal Grandfather     colon  . Stroke Neg Hx    No Known Allergies Prior to Admission medications   Medication Sig Start Date End Date Taking? Authorizing Provider  ACCU-CHEK SOFTCLIX LANCETS lancets 1-2 each by Other route daily. Use as instructed Patient taking differently: 1 each by Other route See admin instructions. Check blood sugar once daily. 04/19/14  Yes Jearld Fenton, NP  aspirin 81 MG tablet Take 81 mg by mouth daily.     Yes Historical Provider, MD  BACLOFEN PO 134.81 mcg by Intrathecal route daily. Continuous infusion with morphine pump.   Yes Historical Provider, MD  Blood Glucose Calibration (ACCU-CHEK AVIVA) SOLN 1 each by In Vitro route as needed. Patient taking differently: 1 each by Other route See  admin instructions. Check blood sugar once daily. 01/21/14  Yes Jearld Fenton, NP  Blood Glucose Monitoring Suppl (ACCU-CHEK AVIVA PLUS) W/DEVICE KIT 1 Device by Does not apply route once. Patient taking differently: 1 each by Other route See admin instructions. Check blood sugar once daily. 01/21/14  Yes Jearld Fenton, NP  bupivacaine (MARCAINE) 0.25 % injection 8.988 mg by Intrathecal route daily.   Yes Historical Provider, MD  buPROPion (WELLBUTRIN XL) 150 MG 24 hr tablet Take 1 tablet (150 mg total) by mouth daily. 10/20/13  Yes Jearld Fenton, NP  CRESTOR 20 MG tablet TAKE 1 TABLET BY MOUTH DAILY Patient taking differently: Take 20 mg by mouth daily. 05/06/14  Yes Candee Furbish, MD  fentaNYL 5,000 mcg by Intrathecal route daily. 134.81 mcg/day   Yes Historical Provider, MD  glucose blood (ACCU-CHEK AVIVA PLUS) test strip 1-2 each by Other route 2 (two) times daily as needed. Patient taking differently: 1 each by Other route See admin instructions. Check blood sugar once daily. 04/19/14  Yes  Jearld Fenton, NP  isosorbide dinitrate (ISORDIL) 30 MG tablet Take 1 tablet (30 mg total) by mouth daily. 10/16/13  Yes Candee Furbish, MD  LYRICA 100 MG capsule Take 100 mg by mouth 2 (two) times daily.  06/01/14  Yes Historical Provider, MD  Multiple Vitamin (MULTIVITAMIN WITH MINERALS) TABS tablet Take 1 tablet by mouth daily.   Yes Historical Provider, MD  nitroGLYCERIN (NITROSTAT) 0.4 MG SL tablet Place 1 tablet (0.4 mg total) under the tongue every 5 (five) minutes as needed for chest pain. 06/09/14  Yes Candee Furbish, MD  oxyCODONE (OXY IR/ROXICODONE) 5 MG immediate release tablet Take 5 mg by mouth 3 (three) times daily as needed for severe pain (1 to 3 times daily as needed for pain.).   Yes Historical Provider, MD  PARoxetine (PAXIL) 20 MG tablet Take 0.5 tablets (10 mg total) by mouth daily. Take 0.5 tablet by mouth twice daily Patient taking differently: Take 10 mg by mouth daily.  10/20/13  Yes Jearld Fenton, NP  polyethylene glycol (MIRALAX / GLYCOLAX) packet Take 17 g by mouth daily.   Yes Historical Provider, MD  tiZANidine (ZANAFLEX) 4 MG tablet Take 4 mg by mouth 3 (three) times daily with meals as needed for muscle spasms (2 to 3 times daily.).   Yes Historical Provider, MD     All other systems have been reviewed and were otherwise negative with the exception of those mentioned in the HPI and as above.  Physical Exam: There were no vitals filed for this visit.  General: Alert, no acute distress Cardiovascular: No pedal edema Respiratory: No cyanosis, no use of accessory musculature Skin: No lesions in the area of chief complaint Neurologic: Sensation intact distally Psychiatric: Patient is competent for consent with normal mood and affect Lymphatic: No axillary or cervical lymphadenopathy   Assessment/Plan: Bilateral cervical radiculopathy Plan for Procedure(s): ANTERIOR CERVICAL DECOMPRESSION/DISCECTOMY FUSION 3 LEVELS   Sinclair Ship, MD 07/28/2014 7:43  AM

## 2014-07-29 ENCOUNTER — Encounter (HOSPITAL_COMMUNITY): Payer: Self-pay | Admitting: Orthopedic Surgery

## 2014-07-29 LAB — GLUCOSE, CAPILLARY
Glucose-Capillary: 162 mg/dL — ABNORMAL HIGH (ref 70–99)
Glucose-Capillary: 140 mg/dL — ABNORMAL HIGH (ref 70–99)

## 2014-07-29 NOTE — Op Note (Signed)
[name], [name] NO.:  [id number]  MEDICAL RECORD NO.:  [id number]  LOCATION:  [id number]                        FACILITY:  [facility]  PHYSICIAN:  Phylliss Bob, MD      DATE OF BIRTH:  24-Oct-1955  DATE OF PROCEDURE:  07/28/2014                              OPERATIVE REPORT   PREOPERATIVE DIAGNOSES: 1. Bilateral cervical radiculopathy. 2. Varying degrees of neural foraminal stenosis C3-4, C4-5, C5-6.  POSTOPERATIVE DIAGNOSES: 1. Bilateral cervical radiculopathy. 2. Varying degrees of neural foraminal stenosis C3-4, C4-5, C5-6.  PROCEDURE: 1. Anterior cervical decompression and fusion C3-4, C4-5, C5-6. 2. Placement of anterior instrumentation C3-C6. 3. Insertion of interbody device x3 (Titan interbody spacers). 4. Use of morselized allograft. 5. Intraoperative use of fluoroscopy  SURGEON:  Phylliss Bob, MD  ASSISTANT:  Pricilla Holm, [facility]  ANESTHESIA:  General endotracheal anesthesia.  COMPLICATIONS:  None.  DISPOSITION:  Stable.  ESTIMATED BLOOD LOSS:  Minimal.  INDICATIONS FOR SURGERY:  Briefly, [name] is a very pleasant 59 year old female who did present to me with severe pain in her bilateral arms, as well as on her left shoulder blade region.  She also had neck pain. She states the pain has been present for a very long period of time. She failed multiple forms of conservative care and did continue to have ongoing and substantial pain.  We therefore discussed proceeding with the procedure noted above.  The patient did fully understand the risks and limitations of surgery.  OPERATIVE DETAILS:  On July 28, 2014, the patient was brought to surgery and general endotracheal anesthesia was administered.  The patient was placed supine on the hospital bed.  The neck was prepped and draped in the usual fashion.  The arms were secured to her sides, and all bony prominences were meticulously padded.  A time-out procedure was performed.  I then made  a left-sided transverse incision overlying the C4-5 interspace.  The platysma was incised.  A lateral fluoroscopic view did confirm the appropriate operative level.  I then proceeded with subperiosteal exposure of the vertebral bodies of C3, C4, C5 and C6.  A self-retaining retractor was placed and centered over the C5-6 interspace.  Caspar pins were placed into the C5 and C6 vertebral bodies and gentle distraction was applied.  I then proceeded with a thorough and complete C5-6 diskectomy.  The posterior longitudinal ligament was identified and entered using a nerve hook.  I then performed a thorough bilateral neural foraminal decompression.  I was very pleased with the decompression that I was able to accomplish.  I then prepared the endplates at C5 and C6.  I then placed a series of trials in the appropriate size interbody spacer, was packed with DBS mix and tamped into position in the usual fashion.  The lower Caspar pin was removed and bone wax was placed in its place.  I then performed a similar diskectomy at the C4-5 level.  Again, a thorough bilateral neural foraminal decompression was confirmed, and the proper size interbody spacer was packed the DBS mix and tamped into position.  Again, I performed a diskectomy at the C3-4 interspace in the manner previously described.  Once again,  a bilateral neural foraminal decompression was confirmed, and once again, the appropriate size interbody spacer was tamped into position after packing it with DBX mix.  The Caspar pins were removed, and bone wax was placed into place.  The appropriate sized anterior cervical plate was placed over the anterior cervical spine.  A 14-mm variable angle screws were placed, 2 in each vertebral body from C3-C6 for a total of 8 vertebral body screws.  I was very pleased with the press-fit of each of the screws.  The screws were then locked to the plate using the CAM locking mechanism.  I then explored the  wound for bleeding.  There were minor areas of bleeding which was controlled using bipolar electrocautery.  The wound was then copiously irrigated with normal saline.  The platysma was then closed using 2-0 Vicryl.  The skin was then closed using 3-0 Monocryl.  Benzoin and Steri-Strips were applied followed by sterile dressing.  All instrument counts were correct at the termination of the procedure.     Phylliss Bob, MD     MD/MEDQ  D:  07/28/2014  T:  07/29/2014  Job:  [id number]

## 2014-07-29 NOTE — Anesthesia Postprocedure Evaluation (Signed)
  Anesthesia Post-op Note  Patient: Melinda [name]  Procedure(s) Performed: Procedure(s) with comments: ANTERIOR CERVICAL DECOMPRESSION/DISCECTOMY FUSION CERVICAL 3-4,4-5,5-6 LEVELS WITH INSTRUMENTATION AND ALLOGRAFT (N/A) - Anterior cervical decompression fusion, cervical 3-4, cervical 4-5, cervical 5-6 with instrumentation and allograft  Patient Location: PACU  Anesthesia Type:General  Level of Consciousness: awake and alert   Airway and Oxygen Therapy: Patient Spontanous Breathing  Post-op Pain: mild  Post-op Assessment: Post-op Vital signs reviewed  Post-op Vital Signs: Reviewed  Last Vitals:  Filed Vitals:   07/29/14 0833  BP: 119/70  Pulse: 81  Temp: 37 C  Resp: 20    Complications: No apparent anesthesia complications

## 2014-07-29 NOTE — Progress Notes (Signed)
Patient alert and oriented, mae's well, voiding adequate amount of urine, swallowing without difficulty, no c/o pain. Patient discharged home with family. Script and discharged instructions given to patient. Patient and family stated understanding of d/c instructions given and has an appointment with MD. Aisha Shada Nienaber RN 

## 2014-07-29 NOTE — Progress Notes (Signed)
    Patient doing well Bilateral arm pain resolved Moderate neck pain Been swallowing well Patient reports increased strength b/l arms   Physical Exam: Filed Vitals:   07/29/14 0430  BP: 138/73  Pulse: 93  Temp: 98.7 F (37.1 C)  Resp: 18    Dressing in place NVI  POD #1 s/p C3-6 ACDF, doing great  - encourage ambulation - Percocet for pain, Valium for muscle spasms - likely d/c home today - c collar at all times

## 2014-07-29 NOTE — Progress Notes (Signed)
Utilization review completed.  

## 2014-08-04 DIAGNOSIS — Z9889 Other specified postprocedural states: Secondary | ICD-10-CM | POA: Diagnosis not present

## 2014-08-04 NOTE — Discharge Summary (Signed)
Patient ID: Melinda [name] MRN: [id number] DOB/AGE: 1955-10-02 59 y.o.  Admit date: 07/28/2014 Discharge date: 07/29/2014  Admission Diagnoses:  Active Problems:   Compression fracture   Discharge Diagnoses:  Same  Past Medical History  Diagnosis Date  . Degenerative disc disease   . Spinal stenosis   . Facet joint disease   . Bulging disc   . MRSA (methicillin resistant staph aureus) culture positive 2011    GREAT TOE RIGHT FOOT  . Fibromyalgia   . Peripheral neuropathy   . MCL deficiency, knee   . Coronary arteritis   . CAD (coronary artery disease) 2009    s/p stent to LAD  . Hyperlipidemia   . Heart disease   . 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 Chicken pox   . Anemia     years ago  . Sleep apnea     uses CPAP, sleep study at [place]: Procedure(s): ANTERIOR CERVICAL DECOMPRESSION/DISCECTOMY FUSION CERVICAL 3-4,4-5,5-6 LEVELS WITH INSTRUMENTATION AND ALLOGRAFT on 07/28/2014   Consultants:  None  Discharged Condition: Improved  Hospital Course: Melinda [name] is an 59 y.o. female who was admitted 07/28/2014 for operative treatment of radiculopathy. Patient has severe unremitting pain that affects sleep, daily activities, and work/hobbies. After pre-op clearance the patient was taken to the operating room on 07/28/2014 and underwent  Procedure(s): ANTERIOR CERVICAL DECOMPRESSION/DISCECTOMY FUSION CERVICAL 3-4,4-5,5-6 LEVELS WITH INSTRUMENTATION AND ALLOGRAFT.    Patient was given perioperative antibiotics:  Anti-infectives    Start     Dose/Rate Route Frequency Ordered Stop   07/28/14 2015  ceFAZolin (ANCEF) IVPB 1 g/50 mL premix     1 g100 mL/hr over 30 Minutes Intravenous Every 8 hours 07/28/14 2014 07/29/14 0513   07/28/14 0600  ceFAZolin (ANCEF) IVPB 2 g/50 mL premix     2 g100 mL/hr over 30 Minutes Intravenous On call to O.R. 07/27/14 1231 07/28/14 1501       Patient was given sequential compression devices,  early ambulation to prevent DVT.  Patient benefited maximally from hospital stay and there were no complications.    Recent vital signs: BP 118/65 mmHg  Pulse 78  Temp(Src) 98.6 F (37 C) (Oral)  Resp 18  Ht 5' 9.5" (1.765 m)  Wt 96.163 kg (212 lb)  BMI 30.87 kg/m2  SpO2 96%   Discharge Medications:     Medication List    STOP taking these medications        bupivacaine 0.25 % injection  Commonly known as:  MARCAINE     multivitamin with minerals Tabs tablet      TAKE these medications        ACCU-CHEK AVIVA PLUS W/DEVICE Kit  1 Device by Does not apply route once.     ACCU-CHEK AVIVA Soln  1 each by In Vitro route as needed.     ACCU-CHEK SOFTCLIX LANCETS lancets  1-2 each by Other route daily. Use as instructed     BACLOFEN PO  134.81 mcg by Intrathecal route daily. Continuous infusion with morphine pump.     buPROPion 150 MG 24 hr tablet  Commonly known as:  WELLBUTRIN XL  Take 1 tablet (150 mg total) by mouth daily.     CRESTOR 20 MG tablet  Generic drug:  rosuvastatin  TAKE 1 TABLET BY MOUTH DAILY     fentaNYL 5,000 mcg  by Intrathecal route daily. 134.81 mcg/day     glucose blood test strip  Commonly known as:  ACCU-CHEK AVIVA PLUS  1-2 each by Other route 2 (two) times daily as needed.     isosorbide dinitrate 30 MG tablet  Commonly known as:  ISORDIL  Take 1 tablet (30 mg total) by mouth daily.     LYRICA 100 MG capsule  Generic drug:  pregabalin  Take 100 mg by mouth 2 (two) times daily.     nitroGLYCERIN 0.4 MG SL tablet  Commonly known as:  NITROSTAT  Place 1 tablet (0.4 mg total) under the tongue every 5 (five) minutes as needed for chest pain.     oxyCODONE 5 MG immediate release tablet  Commonly known as:  Oxy IR/ROXICODONE  Take 5 mg by mouth 3 (three) times daily as needed for severe pain (1 to 3 times daily as needed for pain.).     PARoxetine 20 MG tablet  Commonly known as:  PAXIL  Take 0.5 tablets (10 mg total) by mouth  daily. Take 0.5 tablet by mouth twice daily     polyethylene glycol packet  Commonly known as:  MIRALAX / GLYCOLAX  Take 17 g by mouth daily.        Diagnostic Studies: Dg Cervical Spine 1 View  07/28/2014   CLINICAL DATA:  Anterior cervical disc fusion C3 through C6  EXAM: DG C-ARM GT 120 MIN; DG CERVICAL SPINE - 1 VIEW  TECHNIQUE: Single image from portable C-arm radiography obtained in the operating room.  CONTRAST:  None  FLUOROSCOPY TIME:  1 min 30 seconds  COMPARISON:  06/11/2014  FINDINGS: Anterior sideplate and screw device is identified extending from C3 through C6. Interbody spacers identified at C3-4, C4-5 and C5-6. No complications identified.  IMPRESSION: Status post ACDF of C3 through C6.   Electronically Signed   By: Kerby Moors M.D.   On: 07/28/2014 18:28   Dg C-arm Gt 120 Min  07/28/2014   CLINICAL DATA:  Anterior cervical disc fusion C3 through C6  EXAM: DG C-ARM GT 120 MIN; DG CERVICAL SPINE - 1 VIEW  TECHNIQUE: Single image from portable C-arm radiography obtained in the operating room.  CONTRAST:  None  FLUOROSCOPY TIME:  1 min 30 seconds  COMPARISON:  06/11/2014  FINDINGS: Anterior sideplate and screw device is identified extending from C3 through C6. Interbody spacers identified at C3-4, C4-5 and C5-6. No complications identified.  IMPRESSION: Status post ACDF of C3 through C6.   Electronically Signed   By: Kerby Moors M.D.   On: 07/28/2014 18:28    Disposition: 01-Home or Self Care   POD #1 s/p C3-6 ACDF, doing great  - encourage ambulation - Percocet for pain, Valium for muscle spasms - c collar at all times -Written scripts for pain signed and in chart -D/C instructions sheet printed and in chart -D/C today  -F/U in office 2 weeks   Signed: Justice Britain 08/04/2014, 3:48 PM

## 2014-08-05 ENCOUNTER — Ambulatory Visit: Payer: Self-pay | Admitting: Orthopedic Surgery

## 2014-08-05 DIAGNOSIS — G9519 Other vascular myelopathies: Secondary | ICD-10-CM | POA: Diagnosis not present

## 2014-08-05 DIAGNOSIS — M4802 Spinal stenosis, cervical region: Secondary | ICD-10-CM | POA: Diagnosis not present

## 2014-08-05 DIAGNOSIS — M542 Cervicalgia: Secondary | ICD-10-CM | POA: Diagnosis not present

## 2014-08-10 DIAGNOSIS — Z9889 Other specified postprocedural states: Secondary | ICD-10-CM | POA: Diagnosis not present

## 2014-09-02 DIAGNOSIS — G8929 Other chronic pain: Secondary | ICD-10-CM | POA: Diagnosis not present

## 2014-09-02 DIAGNOSIS — Z79891 Long term (current) use of opiate analgesic: Secondary | ICD-10-CM | POA: Diagnosis not present

## 2014-09-02 DIAGNOSIS — Z79899 Other long term (current) drug therapy: Secondary | ICD-10-CM | POA: Diagnosis not present

## 2014-09-06 ENCOUNTER — Ambulatory Visit (INDEPENDENT_AMBULATORY_CARE_PROVIDER_SITE_OTHER): Payer: Medicare Other | Admitting: Internal Medicine

## 2014-09-06 ENCOUNTER — Encounter: Payer: Self-pay | Admitting: Internal Medicine

## 2014-09-06 VITALS — BP 126/68 | HR 83 | Temp 99.8°F | Wt 212.0 lb

## 2014-09-06 DIAGNOSIS — J069 Acute upper respiratory infection, unspecified: Secondary | ICD-10-CM

## 2014-09-06 LAB — POCT INFLUENZA A/B
Influenza A, POC: NEGATIVE
Influenza B, POC: NEGATIVE

## 2014-09-06 MED ORDER — HYDROCODONE-HOMATROPINE 5-1.5 MG/5ML PO SYRP: 5.0000 mL | ORAL_SOLUTION | Freq: Three times a day (TID) | ORAL | Status: DC | PRN

## 2014-09-06 MED ORDER — LEVOFLOXACIN 500 MG PO TABS: 500.0000 mg | ORAL_TABLET | Freq: Every day | ORAL | Status: DC

## 2014-09-06 NOTE — Progress Notes (Signed)
Subjective:    Patient ID: Melinda [name], female    DOB: Jan 08, 1956, 59 y.o.   MRN: [id number]  HPI Melinda [name] is a 59 year old female who presents today with chief complaint of head and chest congestion. Symptoms began 2 weeks ago and have gradually gotten worse.  She has had a fever and has temp pf 99.8.  She is complaining of yellow thick mucous from her nose and a non productive cough.  She has tried OTC sudafed, alka seltzer, dayquil and nyquil.  She endorses chills and joint pain.      Review of Systems  Constitutional: Positive for fever, chills and fatigue.  HENT: Positive for congestion and rhinorrhea. Negative for sinus pressure and sore throat.   Respiratory: Positive for cough. Negative for shortness of breath and wheezing.   Cardiovascular: Negative for chest pain, palpitations and leg swelling.  Skin: Negative for color change, pallor, rash and wound.  Neurological: Negative for dizziness, light-headedness and headaches.   Past Medical History  Diagnosis Date  . Degenerative disc disease   . Spinal stenosis   . Facet joint disease   . Bulging disc   . MRSA (methicillin resistant staph aureus) culture positive 2011    GREAT TOE RIGHT FOOT  . Fibromyalgia   . Peripheral neuropathy   . MCL deficiency, knee   . Coronary arteritis   . CAD (coronary artery disease) 2009    s/p stent to LAD  . Hyperlipidemia   . Heart disease   . 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 Chicken pox   . Anemia     years ago  . Sleep apnea     uses CPAP, sleep study at [facility] History  Problem Relation Age of Onset  . Cancer Mother     lung  . Diabetes Father   . Heart disease Maternal Grandfather   . Diabetes Paternal Grandmother   . Cancer Paternal Grandfather     colon  . Stroke Neg Hx    Current Outpatient Prescriptions on File Prior to Visit  Medication Sig Dispense Refill  . ACCU-CHEK SOFTCLIX LANCETS lancets 1-2 each by Other route  daily. Use as instructed (Patient taking differently: 1 each by Other route See admin instructions. Check blood sugar once daily.) 100 each 2  . BACLOFEN PO 134.81 mcg by Intrathecal route daily. Continuous infusion with morphine pump.    . Blood Glucose Calibration (ACCU-CHEK AVIVA) SOLN 1 each by In Vitro route as needed. (Patient taking differently: 1 each by Other route See admin instructions. Check blood sugar once daily.) 1 each 1  . Blood Glucose Monitoring Suppl (ACCU-CHEK AVIVA PLUS) W/DEVICE KIT 1 Device by Does not apply route once. (Patient taking differently: 1 each by Other route See admin instructions. Check blood sugar once daily.) 1 kit 0  . buPROPion (WELLBUTRIN XL) 150 MG 24 hr tablet Take 1 tablet (150 mg total) by mouth daily. 90 tablet 3  . CRESTOR 20 MG tablet TAKE 1 TABLET BY MOUTH DAILY (Patient taking differently: Take 20 mg by mouth daily.) 30 tablet 10  . fentaNYL 5,000 mcg by Intrathecal route daily. 134.81 mcg/day    . glucose blood (ACCU-CHEK AVIVA PLUS) test strip 1-2 each by Other route 2 (two) times daily as needed. (Patient taking differently: 1 each by Other route See admin instructions. Check blood sugar once daily.) 100 each 5  . isosorbide dinitrate (ISORDIL) 30 MG tablet Take 1  tablet (30 mg total) by mouth daily. 30 tablet 6  . LYRICA 100 MG capsule Take 100 mg by mouth 2 (two) times daily.     . nitroGLYCERIN (NITROSTAT) 0.4 MG SL tablet Place 1 tablet (0.4 mg total) under the tongue every 5 (five) minutes as needed for chest pain. 25 tablet 2  . oxyCODONE (OXY IR/ROXICODONE) 5 MG immediate release tablet Take 5 mg by mouth 3 (three) times daily as needed for severe pain (1 to 3 times daily as needed for pain.).    Marland Kitchen PARoxetine (PAXIL) 20 MG tablet Take 0.5 tablets (10 mg total) by mouth daily. Take 0.5 tablet by mouth twice daily (Patient taking differently: Take 10 mg by mouth daily. ) 90 tablet 3  . polyethylene glycol (MIRALAX / GLYCOLAX) packet Take 17 g by  mouth daily.     No current facility-administered medications on file prior to visit.        Objective:   Physical Exam  Constitutional: She is oriented to person, place, and time. She appears well-developed and well-nourished.  HENT:  Head: Normocephalic and atraumatic.  Right Ear: Tympanic membrane normal.  Left Ear: Tympanic membrane normal.  Right ear canal exhibits mild swelling.   Neck: Normal range of motion. Neck supple.  Cardiovascular: Normal rate, regular rhythm and normal heart sounds.   Pulmonary/Chest: Effort normal and breath sounds normal. She has no wheezes.  Musculoskeletal: Normal range of motion.  Lymphadenopathy:    She has no cervical adenopathy.  Neurological: She is alert and oriented to person, place, and time.  Skin: Skin is warm and dry.    BP 126/68 mmHg  Pulse 83  Temp(Src) 99.8 F (37.7 C) (Oral)  Wt 212 lb (96.163 kg)  SpO2 95%       Assessment & Plan:  1. Upper respiratory infection  Will Rx for Levaquin 500 mg daily for 7 days.   Flu test negative Continue to rest and drink plenty of fluids.

## 2014-09-06 NOTE — Addendum Note (Signed)
Addended by: Modena Nunnery on: 09/06/2014 04:15 PM   Modules accepted: Orders

## 2014-09-06 NOTE — Progress Notes (Signed)
HPI  Pt presents to the clinic today with c/o nasal and chest congestion. She reports this started 2-3 weeks ago but her symptoms have gotten worse. She is blowing yellow mucous out of her nose. Her cough is unproductive. She has had fever, chills and body aches. She has tried Sudafed, Copywriter, advertising, Dayquil, Nyquil. She has no history of allergies or breathing problems. She has had sick contacts. She does not smoke.  Review of Systems      Past Medical History  Diagnosis Date  . Degenerative disc disease   . Spinal stenosis   . Facet joint disease   . Bulging disc   . MRSA (methicillin resistant staph aureus) culture positive 2011    GREAT TOE RIGHT FOOT  . Fibromyalgia   . Peripheral neuropathy   . MCL deficiency, knee   . Coronary arteritis   . CAD (coronary artery disease) 2009    s/p stent to LAD  . Hyperlipidemia   . Heart disease   . 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 Chicken pox   . Anemia     years ago  . Sleep apnea     uses CPAP, sleep study at [facility] History  Problem Relation Age of Onset  . Cancer Mother     lung  . Diabetes Father   . Heart disease Maternal Grandfather   . Diabetes Paternal Grandmother   . Cancer Paternal Grandfather     colon  . Stroke Neg Hx     History   Social History  . Marital Status: Single    Spouse Name: N/A  . Number of Children: N/A  . Years of Education: N/A   Occupational History  . Not on file.   Social History Main Topics  . Smoking status: Former Smoker -- 1.50 packs/day for 32 years    Types: Cigarettes    Quit date: 07/22/2007  . Smokeless tobacco: Never Used  . Alcohol Use: No  . Drug Use: No  . Sexual Activity: Yes   Other Topics Concern  . Not on file   Social History Narrative    No Known Allergies   Constitutional: Positive headache, fatigue and fever. Denies abrupt weight changes.  HEENT:  Positive nasal congestion. Denies eye redness, eye pain,  pressure behind the eyes, facial pain, ear pain, ringing in the ears, wax buildup, runny nose or sore throat. Respiratory: Positive cough. Denies difficulty breathing or shortness of breath.  Cardiovascular: Denies chest pain, chest tightness, palpitations or swelling in the hands or feet.   No other specific complaints in a complete review of systems (except as listed in HPI above).  Objective:   BP 126/68 mmHg  Pulse 83  Temp(Src) 99.8 F (37.7 C) (Oral)  Wt 212 lb (96.163 kg)  SpO2 95% Wt Readings from Last 3 Encounters:  09/06/14 212 lb (96.163 kg)  06/09/14 213 lb (96.616 kg)  06/09/14 214 lb (97.07 kg)     General: Appears her stated age, ill appearing in NAD. HEENT: Head: normal shape and size, no sinus tenderness noted; Eyes: sclera white, no icterus, conjunctiva pink; Ears: Tm's gray and intact, normal light reflex; Nose: mucosa pink and moist, septum midline; Throat/Mouth:  Teeth present, mucosa erythematous and moist, no exudate noted, no lesions or ulcerations noted.  Neck: No lymphadenopathy. Cardiovascular: Normal rate and rhythm. S1,S2 noted.  No murmur, rubs or gallops noted.  Pulmonary/Chest: Normal effort and positive vesicular breath sounds. No  respiratory distress. No wheezes, rales or ronchi noted.      Assessment & Plan:   Upper respiratory infection:  Get some rest and drink plenty of water Rapid Flu: negative eRx for Levaquin daily x 7 days eRx for Hycodan cough syrup  RTC as needed or if symptoms persist.

## 2014-09-06 NOTE — Progress Notes (Signed)
Pre visit review using our clinic review tool, if applicable. No additional management support is needed unless otherwise documented below in the visit note. 

## 2014-09-06 NOTE — Patient Instructions (Signed)
Cough, Adult  A cough is a reflex that helps clear your throat and airways. It can help heal the body or may be a reaction to an irritated airway. A cough may only last 2 or 3 weeks (acute) or may last more than 8 weeks (chronic).  CAUSES Acute cough:  Viral or bacterial infections. Chronic cough:  Infections.  Allergies.  Asthma.  Post-nasal drip.  Smoking.  Heartburn or acid reflux.  Some medicines.  Chronic lung problems (COPD).  Cancer. SYMPTOMS   Cough.  Fever.  Chest pain.  Increased breathing rate.  High-pitched whistling sound when breathing (wheezing).  Colored mucus that you cough up (sputum). TREATMENT   A bacterial cough may be treated with antibiotic medicine.  A viral cough must run its course and will not respond to antibiotics.  Your caregiver may recommend other treatments if you have a chronic cough. HOME CARE INSTRUCTIONS   Only take over-the-counter or prescription medicines for pain, discomfort, or fever as directed by your caregiver. Use cough suppressants only as directed by your caregiver.  Use a cold steam vaporizer or humidifier in your bedroom or home to help loosen secretions.  Sleep in a semi-upright position if your cough is worse at night.  Rest as needed.  Stop smoking if you smoke. SEEK IMMEDIATE MEDICAL CARE IF:   You have pus in your sputum.  Your cough starts to worsen.  You cannot control your cough with suppressants and are losing sleep.  You begin coughing up blood.  You have difficulty breathing.  You develop pain which is getting worse or is uncontrolled with medicine.  You have a fever. MAKE SURE YOU:   Understand these instructions.  Will watch your condition.  Will get help right away if you are not doing well or get worse. Document Released: 12/15/2010 Document Revised: 09/10/2011 Document Reviewed: 12/15/2010 ExitCare Patient Information 2015 [employer]. This information is not intended  to replace advice given to you by your health care provider. Make sure you discuss any questions you have with your health care provider.  

## 2014-09-07 DIAGNOSIS — Z9889 Other specified postprocedural states: Secondary | ICD-10-CM | POA: Diagnosis not present

## 2014-09-10 ENCOUNTER — Ambulatory Visit (INDEPENDENT_AMBULATORY_CARE_PROVIDER_SITE_OTHER): Payer: Medicare Other | Admitting: Internal Medicine

## 2014-09-10 ENCOUNTER — Encounter: Payer: Self-pay | Admitting: Internal Medicine

## 2014-09-10 VITALS — BP 124/76 | HR 73 | Temp 98.8°F | Wt 206.0 lb

## 2014-09-10 DIAGNOSIS — L84 Corns and callosities: Secondary | ICD-10-CM | POA: Diagnosis not present

## 2014-09-10 DIAGNOSIS — E1140 Type 2 diabetes mellitus with diabetic neuropathy, unspecified: Secondary | ICD-10-CM

## 2014-09-10 NOTE — Patient Instructions (Signed)
Corns and Calluses Corns are small areas of thickened skin that usually occur on the top, sides, or tip of a toe. They contain a cone-shaped core with a point that can press on a nerve below. This causes pain. Calluses are areas of thickened skin that usually develop on hands, fingers, palms, soles of the feet, and heels. These are areas that experience frequent friction or pressure. CAUSES  Corns are usually the result of rubbing (friction) or pressure from shoes that are too tight or do not fit properly. Calluses are caused by repeated friction and pressure on the affected areas. SYMPTOMS  A hard growth on the skin.  Pain or tenderness under the skin.  Sometimes, redness and swelling.  Increased discomfort while wearing tight-fitting shoes. DIAGNOSIS  Your caregiver can usually tell what the problem is by doing a physical exam. TREATMENT  Removing the cause of the friction or pressure is usually the only treatment needed. However, sometimes medicines can be used to help soften the hardened, thickened areas. These medicines include salicylic acid plasters and 12% ammonium lactate lotion. These medicines should only be used under the direction of your caregiver. HOME CARE INSTRUCTIONS   Try to remove pressure from the affected area.  You may wear donut-shaped corn pads to protect your skin.  You may use a pumice stone or nonmetallic nail file to gently reduce the thickness of a corn.  Wear properly fitted footwear.  If you have calluses on the hands, wear gloves during activities that cause friction.  If you have diabetes, you should regularly examine your feet. Tell your caregiver if you notice any problems with your feet. SEEK IMMEDIATE MEDICAL CARE IF:   You have increased pain, swelling, redness, or warmth in the affected area.  Your corn or callus starts to drain fluid or bleeds.  You are not getting better, even with treatment. Document Released: 03/24/2004 Document  Revised: 09/10/2011 Document Reviewed: 02/13/2011 ExitCare Patient Information 2015 [employer]. This information is not intended to replace advice given to you by your health care provider. Make sure you discuss any questions you have with your health care provider.  

## 2014-09-10 NOTE — Progress Notes (Signed)
Pre visit review using our clinic review tool, if applicable. No additional management support is needed unless otherwise documented below in the visit note. 

## 2014-09-10 NOTE — Progress Notes (Signed)
Subjective:    Patient ID: Melinda [name], female    DOB: [birth year], 57 y.o.   MRN: [id number]  HPI  Pt presents to the clinic today with c/o a sore spot on the bottom of her right foot. She reports there is a blister in the area. The pain is worse with walking. She feels like her toes are warmer than the rest of her foot. The area is not draining. It does open up and bleed at times. She denies fever, chills or body aches. She has had a similar issue in the past for which she had to go to see a podiatrist. She did see Dr. Charlsie Merles at that time. She reports he just cut the area off with a scalpel. This made her pain worse. She is interested in a referral to the wound center. Of note, she does have DM 2 and neuropathy which may be contributing factors. Last A1C was 6.1%. 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 She has also had a fusion of the 2nd toe.  Review of Systems      Past Medical History  Diagnosis Date  . Degenerative disc disease   . Spinal stenosis   . Facet joint disease   . Bulging disc   . MRSA (methicillin resistant staph aureus) culture positive 2011    GREAT TOE RIGHT FOOT  . Fibromyalgia   . Peripheral neuropathy   . MCL deficiency, knee   . Coronary arteritis   . CAD (coronary artery disease) 2009    s/p stent to LAD  . Hyperlipidemia   . Heart disease   . 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 Chicken pox   . Anemia     years ago  . Sleep apnea     uses CPAP, sleep study at [facility]    Current Outpatient Prescriptions  Medication Sig Dispense Refill  . ACCU-CHEK SOFTCLIX LANCETS lancets 1-2 each by Other route daily. Use as instructed (Patient taking differently: 1 each by Other route See admin instructions. Check blood sugar once daily.) 100 each 2  . BACLOFEN PO 134.81 mcg by Intrathecal route daily. Continuous infusion with morphine pump.    . Blood Glucose Calibration (ACCU-CHEK AVIVA) SOLN 1 each by In  Vitro route as needed. (Patient taking differently: 1 each by Other route See admin instructions. Check blood sugar once daily.) 1 each 1  . Blood Glucose Monitoring Suppl (ACCU-CHEK AVIVA PLUS) W/DEVICE KIT 1 Device by Does not apply route once. (Patient taking differently: 1 each by Other route See admin instructions. Check blood sugar once daily.) 1 kit 0  . buPROPion (WELLBUTRIN XL) 150 MG 24 hr tablet Take 1 tablet (150 mg total) by mouth daily. 90 tablet 3  . CRESTOR 20 MG tablet TAKE 1 TABLET BY MOUTH DAILY (Patient taking differently: Take 20 mg by mouth daily.) 30 tablet 10  . fentaNYL 5,000 mcg by Intrathecal route daily. 134.81 mcg/day    . glucose blood (ACCU-CHEK AVIVA PLUS) test strip 1-2 each by Other route 2 (two) times daily as needed. (Patient taking differently: 1 each by Other route See admin instructions. Check blood sugar once daily.) 100 each 5  . isosorbide dinitrate (ISORDIL) 30 MG tablet Take 1 tablet (30 mg total) by mouth daily. 30 tablet 6  . levofloxacin (LEVAQUIN) 500 MG tablet Take 1 tablet (500 mg total) by mouth daily. 7 tablet 0  . LYRICA 100 MG capsule Take 100  mg by mouth 2 (two) times daily.     . nitroGLYCERIN (NITROSTAT) 0.4 MG SL tablet Place 1 tablet (0.4 mg total) under the tongue every 5 (five) minutes as needed for chest pain. 25 tablet 2  . oxyCODONE (OXY IR/ROXICODONE) 5 MG immediate release tablet Take 5 mg by mouth 3 (three) times daily as needed for severe pain (1 to 3 times daily as needed for pain.).    Marland Kitchen PARoxetine (PAXIL) 20 MG tablet Take 0.5 tablets (10 mg total) by mouth daily. Take 0.5 tablet by mouth twice daily (Patient taking differently: Take 10 mg by mouth daily. ) 90 tablet 3  . polyethylene glycol (MIRALAX / GLYCOLAX) packet Take 17 g by mouth daily.     No current facility-administered medications for this visit.    No Known Allergies  Family History  Problem Relation Age of Onset  . Cancer Mother     lung  . Diabetes Father     . Heart disease Maternal Grandfather   . Diabetes Paternal Grandmother   . Cancer Paternal Grandfather     colon  . Stroke Neg Hx     History   Social History  . Marital Status: Single    Spouse Name: N/A  . Number of Children: N/A  . Years of Education: N/A   Occupational History  . Not on file.   Social History Main Topics  . Smoking status: Former Smoker -- 1.50 packs/day for 32 years    Types: Cigarettes    Quit date: 07/22/2007  . Smokeless tobacco: Never Used  . Alcohol Use: No  . Drug Use: No  . Sexual Activity: Yes   Other Topics Concern  . Not on file   Social History Narrative     Constitutional: Denies fever, malaise, fatigue, headache or abrupt weight changes.  Respiratory: Denies difficulty breathing, shortness of breath, cough or sputum production.   Cardiovascular: Denies chest pain, chest tightness, palpitations or swelling in the hands or feet.  Musculoskeletal: Pt reports right foot pain. Denies decrease in range of motion, muscle pain or joint pain and swelling.  Skin: Pt reports blister to bottom of right foot. Denies redness, rashes.    No other specific complaints in a complete review of systems (except as listed in HPI above).  Objective:   Physical Exam  BP 124/76 mmHg  Pulse 73  Temp(Src) 98.8 F (37.1 C) (Oral)  Wt 206 lb (93.441 kg)  SpO2 97%  Wt Readings from Last 3 Encounters:  09/10/14 206 lb (93.441 kg)  09/06/14 212 lb (96.163 kg)  06/09/14 213 lb (96.616 kg)    General: Appears her stated age, obese, in NAD. Skin: Warm, dry and intact. 2 cm x 2 cm round callous to ball of right foot. Open in areas with bloody drainage noted. No surround warm or redness noted. Cardiovascular: Normal rate and rhythm. S1,S2 noted.  No murmur, rubs or gallops noted.  Pulmonary/Chest: Normal effort and positive vesicular breath sounds. No respiratory distress. No wheezes, rales or ronchi noted.  Musculoskeletal: Mildly ataxic gait. No signs  of joint swelling of the foot. Neurological: Alert and oriented.Sensation decreased with 10 gm monofilament.  BMET    Component Value Date/Time   NA 140 07/21/2014 1340   K 4.2 07/21/2014 1340   CL 101 07/21/2014 1340   CO2 30 07/21/2014 1340   GLUCOSE 103* 07/21/2014 1340   BUN 18 07/21/2014 1340   CREATININE 0.79 07/21/2014 1340   CALCIUM 9.1 07/21/2014 1340  GFRNONAA >90 07/21/2014 1340   GFRAA >90 07/21/2014 1340    Lipid Panel     Component Value Date/Time   CHOL 93 04/27/2014 1036   TRIG 113.0 04/27/2014 1036   HDL 29.00* 04/27/2014 1036   CHOLHDL 3 04/27/2014 1036   VLDL 22.6 04/27/2014 1036   LDLCALC 41 04/27/2014 1036    CBC    Component Value Date/Time   WBC 7.1 07/21/2014 1340   RBC 4.41 07/21/2014 1340   RBC 3.47* 10/05/2013 0805   HGB 12.2 07/21/2014 1340   HCT 37.8 07/21/2014 1340   PLT 234 07/21/2014 1340   MCV 85.7 07/21/2014 1340   MCH 27.7 07/21/2014 1340   MCHC 32.3 07/21/2014 1340   RDW 13.0 07/21/2014 1340   LYMPHSABS 1.7 07/21/2014 1340   MONOABS 0.7 07/21/2014 1340   EOSABS 0.3 07/21/2014 1340   BASOSABS 0.0 07/21/2014 1340    Hgb A1C Lab Results  Component Value Date   HGBA1C 6.2 04/27/2014         Assessment & Plan:   Preulcerative Callus of ball of right foot:  Considered salicylic acid to area but given open areas and history of DM2, will refer to wound center Avoid picking at if for now [facility] to soak in warm water TID  Will follow up as needed

## 2014-09-20 ENCOUNTER — Ambulatory Visit: Payer: Self-pay | Admitting: Surgery

## 2014-09-20 ENCOUNTER — Encounter
Admit: 2014-09-20 | Disposition: A | Discharge status: Home / Self Care | Payer: Self-pay | Attending: Surgery | Admitting: Surgery

## 2014-09-20 DIAGNOSIS — L97519 Non-pressure chronic ulcer of other part of right foot with unspecified severity: Secondary | ICD-10-CM | POA: Diagnosis not present

## 2014-09-20 DIAGNOSIS — F329 Major depressive disorder, single episode, unspecified: Secondary | ICD-10-CM | POA: Diagnosis not present

## 2014-09-20 DIAGNOSIS — L84 Corns and callosities: Secondary | ICD-10-CM | POA: Diagnosis not present

## 2014-09-20 DIAGNOSIS — Z87891 Personal history of nicotine dependence: Secondary | ICD-10-CM | POA: Diagnosis not present

## 2014-09-20 DIAGNOSIS — L03115 Cellulitis of right lower limb: Secondary | ICD-10-CM | POA: Diagnosis not present

## 2014-09-20 DIAGNOSIS — E119 Type 2 diabetes mellitus without complications: Secondary | ICD-10-CM | POA: Diagnosis not present

## 2014-09-20 DIAGNOSIS — G4733 Obstructive sleep apnea (adult) (pediatric): Secondary | ICD-10-CM | POA: Diagnosis not present

## 2014-09-20 DIAGNOSIS — E11621 Type 2 diabetes mellitus with foot ulcer: Secondary | ICD-10-CM | POA: Diagnosis not present

## 2014-09-20 DIAGNOSIS — E11610 Type 2 diabetes mellitus with diabetic neuropathic arthropathy: Secondary | ICD-10-CM | POA: Diagnosis not present

## 2014-09-20 DIAGNOSIS — L97512 Non-pressure chronic ulcer of other part of right foot with fat layer exposed: Secondary | ICD-10-CM | POA: Diagnosis not present

## 2014-09-27 ENCOUNTER — Encounter
Admit: 2014-09-27 | Disposition: A | Discharge status: Home / Self Care | Payer: Self-pay | Attending: Orthopedic Surgery | Admitting: Orthopedic Surgery

## 2014-09-27 DIAGNOSIS — M2560 Stiffness of unspecified joint, not elsewhere classified: Secondary | ICD-10-CM | POA: Diagnosis not present

## 2014-09-27 DIAGNOSIS — M542 Cervicalgia: Secondary | ICD-10-CM | POA: Diagnosis not present

## 2014-09-28 DIAGNOSIS — L84 Corns and callosities: Secondary | ICD-10-CM | POA: Diagnosis not present

## 2014-09-28 DIAGNOSIS — G4733 Obstructive sleep apnea (adult) (pediatric): Secondary | ICD-10-CM | POA: Diagnosis not present

## 2014-09-28 DIAGNOSIS — E11621 Type 2 diabetes mellitus with foot ulcer: Secondary | ICD-10-CM | POA: Diagnosis not present

## 2014-09-28 DIAGNOSIS — E11610 Type 2 diabetes mellitus with diabetic neuropathic arthropathy: Secondary | ICD-10-CM | POA: Diagnosis not present

## 2014-09-28 DIAGNOSIS — L97512 Non-pressure chronic ulcer of other part of right foot with fat layer exposed: Secondary | ICD-10-CM | POA: Diagnosis not present

## 2014-09-28 DIAGNOSIS — Z87891 Personal history of nicotine dependence: Secondary | ICD-10-CM | POA: Diagnosis not present

## 2014-10-01 ENCOUNTER — Encounter
Admit: 2014-10-01 | Disposition: A | Discharge status: Home / Self Care | Payer: Self-pay | Attending: Orthopedic Surgery | Admitting: Orthopedic Surgery

## 2014-10-01 ENCOUNTER — Encounter
Admit: 2014-10-01 | Disposition: A | Discharge status: Home / Self Care | Payer: Self-pay | Attending: Surgery | Admitting: Surgery

## 2014-10-01 DIAGNOSIS — E11621 Type 2 diabetes mellitus with foot ulcer: Secondary | ICD-10-CM | POA: Diagnosis not present

## 2014-10-01 DIAGNOSIS — G4733 Obstructive sleep apnea (adult) (pediatric): Secondary | ICD-10-CM | POA: Diagnosis not present

## 2014-10-01 DIAGNOSIS — E11610 Type 2 diabetes mellitus with diabetic neuropathic arthropathy: Secondary | ICD-10-CM | POA: Diagnosis not present

## 2014-10-01 DIAGNOSIS — Z87891 Personal history of nicotine dependence: Secondary | ICD-10-CM | POA: Diagnosis not present

## 2014-10-01 DIAGNOSIS — F329 Major depressive disorder, single episode, unspecified: Secondary | ICD-10-CM | POA: Diagnosis not present

## 2014-10-01 DIAGNOSIS — L84 Corns and callosities: Secondary | ICD-10-CM | POA: Diagnosis not present

## 2014-10-01 DIAGNOSIS — L97512 Non-pressure chronic ulcer of other part of right foot with fat layer exposed: Secondary | ICD-10-CM | POA: Diagnosis not present

## 2014-10-05 DIAGNOSIS — G4733 Obstructive sleep apnea (adult) (pediatric): Secondary | ICD-10-CM | POA: Diagnosis not present

## 2014-10-05 DIAGNOSIS — L84 Corns and callosities: Secondary | ICD-10-CM | POA: Diagnosis not present

## 2014-10-05 DIAGNOSIS — E11610 Type 2 diabetes mellitus with diabetic neuropathic arthropathy: Secondary | ICD-10-CM | POA: Diagnosis not present

## 2014-10-05 DIAGNOSIS — L97512 Non-pressure chronic ulcer of other part of right foot with fat layer exposed: Secondary | ICD-10-CM | POA: Diagnosis not present

## 2014-10-05 DIAGNOSIS — E11621 Type 2 diabetes mellitus with foot ulcer: Secondary | ICD-10-CM | POA: Diagnosis not present

## 2014-10-05 DIAGNOSIS — Z87891 Personal history of nicotine dependence: Secondary | ICD-10-CM | POA: Diagnosis not present

## 2014-10-05 DIAGNOSIS — Z9889 Other specified postprocedural states: Secondary | ICD-10-CM | POA: Diagnosis not present

## 2014-10-06 DIAGNOSIS — Z462 Encounter for fitting and adjustment of other devices related to nervous system and special senses: Secondary | ICD-10-CM | POA: Diagnosis not present

## 2014-10-06 DIAGNOSIS — G8929 Other chronic pain: Secondary | ICD-10-CM | POA: Diagnosis not present

## 2014-10-06 DIAGNOSIS — Z9689 Presence of other specified functional implants: Secondary | ICD-10-CM | POA: Diagnosis not present

## 2014-10-06 DIAGNOSIS — M542 Cervicalgia: Secondary | ICD-10-CM | POA: Diagnosis not present

## 2014-10-06 DIAGNOSIS — M545 Low back pain: Secondary | ICD-10-CM | POA: Diagnosis not present

## 2014-10-07 DIAGNOSIS — M542 Cervicalgia: Secondary | ICD-10-CM | POA: Diagnosis not present

## 2014-10-07 DIAGNOSIS — M2560 Stiffness of unspecified joint, not elsewhere classified: Secondary | ICD-10-CM | POA: Diagnosis not present

## 2014-10-12 DIAGNOSIS — E11610 Type 2 diabetes mellitus with diabetic neuropathic arthropathy: Secondary | ICD-10-CM | POA: Diagnosis not present

## 2014-10-12 DIAGNOSIS — G4733 Obstructive sleep apnea (adult) (pediatric): Secondary | ICD-10-CM | POA: Diagnosis not present

## 2014-10-12 DIAGNOSIS — E11621 Type 2 diabetes mellitus with foot ulcer: Secondary | ICD-10-CM | POA: Diagnosis not present

## 2014-10-12 DIAGNOSIS — L97512 Non-pressure chronic ulcer of other part of right foot with fat layer exposed: Secondary | ICD-10-CM | POA: Diagnosis not present

## 2014-10-12 DIAGNOSIS — L97511 Non-pressure chronic ulcer of other part of right foot limited to breakdown of skin: Secondary | ICD-10-CM | POA: Diagnosis not present

## 2014-10-12 DIAGNOSIS — Z87891 Personal history of nicotine dependence: Secondary | ICD-10-CM | POA: Diagnosis not present

## 2014-10-12 DIAGNOSIS — L84 Corns and callosities: Secondary | ICD-10-CM | POA: Diagnosis not present

## 2014-10-13 DIAGNOSIS — M2560 Stiffness of unspecified joint, not elsewhere classified: Secondary | ICD-10-CM | POA: Diagnosis not present

## 2014-10-13 DIAGNOSIS — M542 Cervicalgia: Secondary | ICD-10-CM | POA: Diagnosis not present

## 2014-10-15 ENCOUNTER — Telehealth: Payer: Self-pay

## 2014-10-15 NOTE — Telephone Encounter (Signed)
I can not authorize forms for diabetic shoes. Have her make appt with one of the MD's here.

## 2014-10-15 NOTE — Telephone Encounter (Signed)
[place] left v/m; pt was seen for diabetic foot wound and pt discharged from [facility] earlier this week and pt given rx for diabetic shoes that pt needs. Pt contacted wound care and ins said PCP has to order the diabetic shoes; next week pt will bring paperwork to [name] NP to fill out for diabetic shoes.This is just FYI to [name] NP.

## 2014-10-18 NOTE — Telephone Encounter (Signed)
Spoke with pt and she has a f/u appt with Dr Deborra Medina on 10/25/2014

## 2014-10-21 DIAGNOSIS — M542 Cervicalgia: Secondary | ICD-10-CM | POA: Diagnosis not present

## 2014-10-21 DIAGNOSIS — M2560 Stiffness of unspecified joint, not elsewhere classified: Secondary | ICD-10-CM | POA: Diagnosis not present

## 2014-10-24 NOTE — Op Note (Signed)
PATIENT NAME:  Melinda [name], CORTINA MR#:  [id number] DATE OF BIRTH:  Jan 19, 1956  DATE OF PROCEDURE:  11/09/2011  Location: Operating Room Referring Physician:  "Coco"  fax [phone] Consulting Pain Physician/Surgeon: [place]. Dossie Arbour, MD  Note:  This is the case of a 59 year old white female patient with a longstanding history of chronic low back pain secondary to a lumbar post laminectomy syndrome who has been  managed and under our care since 2006 with an intrathecal pump. The management has been successful, but at this point the pump has reached the end of its battery life. In the last visit to our clinic the patient complained that the pump was beeping and when we did the analysis it reported that it had reached the end of its battery life and it needed to be replaced.   Procedure(s):  1. Removal of Battery-depleted Intrathecal [employer]'s Programmable Pump Reservoir. 2. Replacement of [employer]'s Intrathecal Programmable Pump. 3. Intraoperative Analysis and Programming of the Pump. 4. Intraoperative Reservoir Refill.  Anesthesia: General anesthesia with local anesthesia by the surgeon for postop pain. Anesthesiologist:  [facility], Dr. Ronelle Nigh Surgeon: Beatriz Chancellor A. Dossie Arbour, M.D. Side of pump implant: Right side. Diagnostic Indications: Chronic low back pain; Chronic Pain Syndrome; Chronic Lumbosacral Radiculopathy/Radiculitis; Failed Back Surgery Syndrome. End of battery-life. Prophylactic Antibiotics: Ancef (cefazolin) 1 gm. IV, 30 min before procedure. Position: Supine Prepping solution: DuraPrep Area prepped: Abdominal area. The abdominal area was prepped with a broad-spectrum topical antiseptic microbicide. Pump medication: New refill using the same medication and concentration as previously used in the other pump. Volume: 40 ml Catheter Length: See programmed pump readout. Dose / Mode: Same program and rate as previously used in the other  pump.  Infection Control:  Standard Universal Precautions taken (Respiratory Hygiene/Cough Etiquette; Mouth, nose, eye protection; Hand Hygiene; Personal protective equipment (PPE); safe injection practices; and use of masks and disposable sterile surgical gloves) as recommended by the Department of [place] for Disease Control and Prevention (CDC).  Safety Measures:  Allergies were reviewed. Appropriate site, procedure, and patient were confirmed by following the Joint Commission's Universal Protocol (UP.01.01.01). The patient was asked to confirm marked site and procedure, before commencing. The patient was asked about blood thinners, or active infections, both of which were denied. No attempt was made at seeking any paresthesias. Aspiration looking for blood return was conducted prior to injecting. At no point did we inject any substances, as a needle was being advanced.  Pre-procedure Assessment:  A medical history and physical exam were obtained. Relevant documentation was reviewed and verified. Prior to the procedure, the patient was provided with an Audio CD, as well as written information on the procedure, including side-effects, and possible complications. Under the influence of no sedatives, a verbal, as well as a written informed consent were obtained, after having provided information on the risks and possible complications. To fulfill our ethical and legal obligations, as recommended by the American Medical Association's Code of Ethics, we have provided information to the patient about our clinical impression; the nature and purpose of an available treatment or procedure; the risks and benefits of an available treatment or procedure; alternatives; the risk and benefits of the alternative treatment or procedure; and the risks and benefits of not receiving or undergoing a treatment or procedure. The patient was provided information about the risks and possible complications associated  with the procedure. These include, but not limited to, failure to achieve desired goals, infection, bleeding, organ or  nerve damage, allergic reactions, paralysis, and death. In addition, the patient was informed that Medicine is not an exact science; therefore, there is also the possibility of unforeseen risks and possible complications that may result in a catastrophic outcome. The patient indicated having understood very clearly.  We have given the patient no guarantees and we have made no promises. Ample time was given to the patient to ask questions, all of which were answered, to the patient's satisfaction, before proceeding. The patient understands that by signing our informed consent form, they understand and accept the risks and the fact that it is impossible to predict all possible complications. Baseline vital signs were taken and the medical assessment was completed. Verification of the correct person, correct site (including marking of site), and correct procedure were performed and confirmed by the patient.  Monitoring:  The patient was monitored in the usual manner, using NIBPM, ECG, and pulse oximetry.  Sedation:  Availability of a responsible, adult driver, and NPO status confirmed. An IV was started. Sedation provided by anesthesia team. Please see chart for dosage. Meaningful verbal contact was maintained with the patient at all times. After the procedure, the patient was sent home in stable condition, accompanied by a responsible adult driver.  Local Anesthesia:  Lidocaine 1%. The skin over the procedure site were infiltrated using a 3 ml Luer-Lok syringe with a 0.5 inch, 25-G needle. Deeper tissues were infiltrated using a 3.0 inch, 22-G spinal needle, under fluoroscopic guidance.  Description of the procedure:  The procedure site was prepped using a broad-spectrum topical antiseptic. The area was then draped in the usual and standard manner. "Time-out" was performed as per JC Universal  Protocol (UP.01.01.01).   Pump replacement implant:  The old pump was identified and the anchors removed. Care was taken not to damage the catheter. Meanwhile, the new pump was emptied from its original content and refilled with the desired medication. The new pump was then interrogated, programmed, and primed. The catheter was then disconnected from the old pump and immediately connected to the new one, after having aspirated and confirmed that it was still patent. The new pump was inserted into the pocket after having cleaned it.   The excess catheter was place behind the pump and the pump was secured to the abdominal wall using 2-0 non-absorbable sutures. A suture was placed in all four corners of the pump, so as to prevent migration or flipping of the pump. The wound was checked for adequate hemostasis, irrigated, and cleaned prior to closure. The wound was approximated using 2-0 Vicryl. Great care was taken, so as to avoid puncturing the intrathecal catheter. The skin was closed using surgical staples. The wound was covered with a sterile gauze, for absorption, and a bio-occlusive dressing. An abdominal binder was placed to minimize fluid accumulation or development of a hematoma. The patient was then transferred to the post-anesthesia recovery unit, where final analysis and programming of the device was done.  The patient tolerated the entire procedure well. A repeat set of vitals were taken after the procedure and the patient was kept under observation until discharge criteria was met. The patient was provided with discharge instructions, including a section on how to identify potential problems. Should any problems arise concerning this procedure, the patient was given instructions to immediately contact us, without hesitation. The [employer]'s representative and I, both provided the patient with our Business cards containing our contact telephone numbers, and instructed the patient to contact either one  of [place],  at any time, should there be any problems or questions. In any case, we plan to contact the patient by telephone for a follow-up status report regarding this interventional procedure.  EBL: 10 ml  Complications: No heme; no paresthesias.  Disposition: Return to clinics in 10 - 11 days for post-procedure evaluation and removal of staples.  Additional Comments/Plan: None.  Equipment used:  Medtronic SynchroMed II pump, serial number [id number], model number [id number].  Calibration constant 115.  Disclaimer: Medicine is not an Chief Strategy Officer. The only guarantee in medicine is that nothing is guaranteed. It is important to note that the decision to proceed with this intervention was based on the information collected from the patient. The Data and conclusions were drawn from the patient's questionnaire, the interview, and the physical examination. Because the information was provided in large part by the patient, it cannot be guaranteed that it has not been purposely or unconsciously manipulated. Every effort has been made to obtain as much relevant data as possible for this evaluation. It is important to note that the conclusions that lead to this procedure are derived in large part from the available data. Always take into account that the treatment will also be dependent on availability of resources and existing treatment guidelines, considered by other Pain Management Practitioners as being common knowledge and practice, at this time. For Medico-Legal purposes, it is also important to point out that variations in procedural techniques and pharmacological choices are the acceptable norm. The indications, contraindications, technique, and results of the above procedure should only be interpreted and judged by a Board-Certified Interventional Pain Specialist with extensive familiarity and expertise in the same exact procedure and technique, doing otherwise would be inappropriate and  unethical.  ____________________________ Kathlen Brunswick. Dossie Arbour, MD fan:bjt D:  11/09/2011 09:20:55 ET         T: 11/09/2011 11:07:50 ET JOB#: [id number]  cc: Kushal Saunders A. Dossie Arbour, MD, <Dictator> [place],  fax [phone]  Gaspar Cola MD ELECTRONICALLY SIGNED 11/12/2011 7:57

## 2014-10-25 ENCOUNTER — Ambulatory Visit (INDEPENDENT_AMBULATORY_CARE_PROVIDER_SITE_OTHER): Payer: Medicare Other | Admitting: Family Medicine

## 2014-10-25 ENCOUNTER — Encounter: Payer: Self-pay | Admitting: Family Medicine

## 2014-10-25 VITALS — BP 112/66 | HR 76 | Temp 98.3°F | Wt 211.5 lb

## 2014-10-25 DIAGNOSIS — Z89411 Acquired absence of right great toe: Secondary | ICD-10-CM | POA: Diagnosis not present

## 2014-10-25 DIAGNOSIS — IMO0002 Reserved for concepts with insufficient information to code with codable children: Secondary | ICD-10-CM | POA: Insufficient documentation

## 2014-10-25 DIAGNOSIS — E119 Type 2 diabetes mellitus without complications: Secondary | ICD-10-CM | POA: Diagnosis not present

## 2014-10-25 NOTE — Progress Notes (Signed)
Subjective:   Patient ID: Melinda [name], female    DOB: 12-28-1955, 59 y.o.   MRN: [id number]  Melinda [name] is a pleasant 59 y.o. year old female pt with complicated history, of [name] who is new to me, who presents to clinic today with Diabetes  on 10/25/2014  HPI:  Needs rx for diabetic shoes and ? only MD can sign those forms.  Chart reviewed.  DM-  Diet controlled.  Checks FSBS every other day.  Usually 119-134 fasting.  Lab Results  Component Value Date   HGBA1C 6.2 04/27/2014   H/o peripheral neuropathy, not diabetes associated and is s/p right great toe amputations.  Has also been treated for numerous calluses and ulcers at wound center.   Feet hurt constantly.  Needs better shoes.  Has been to podiatry in past.  Current Outpatient Prescriptions on File Prior to Visit  Medication Sig Dispense Refill  . ACCU-CHEK SOFTCLIX LANCETS lancets 1-2 each by Other route daily. Use as instructed (Patient taking differently: 1 each by Other route See admin instructions. Check blood sugar once daily.) 100 each 2  . BACLOFEN PO 88.7 mcg by Intrathecal route daily. Continuous infusion with morphine pump.    . Blood Glucose Calibration (ACCU-CHEK AVIVA) SOLN 1 each by In Vitro route as needed. (Patient taking differently: 1 each by Other route See admin instructions. Check blood sugar once daily.) 1 each 1  . Blood Glucose Monitoring Suppl (ACCU-CHEK AVIVA PLUS) W/DEVICE KIT 1 Device by Does not apply route once. (Patient taking differently: 1 each by Other route See admin instructions. Check blood sugar once daily.) 1 kit 0  . buPROPion (WELLBUTRIN XL) 150 MG 24 hr tablet Take 1 tablet (150 mg total) by mouth daily. 90 tablet 3  . CRESTOR 20 MG tablet TAKE 1 TABLET BY MOUTH DAILY (Patient taking differently: Take 20 mg by mouth daily.) 30 tablet 10  . fentaNYL 5,000 mcg by Intrathecal route daily. 147.95 mcg/day    . glucose blood (ACCU-CHEK AVIVA PLUS) test strip 1-2 each by Other  route 2 (two) times daily as needed. (Patient taking differently: 1 each by Other route See admin instructions. Check blood sugar once daily.) 100 each 5  . isosorbide dinitrate (ISORDIL) 30 MG tablet Take 1 tablet (30 mg total) by mouth daily. 30 tablet 6  . LYRICA 100 MG capsule Take 100 mg by mouth 2 (two) times daily.     . nitroGLYCERIN (NITROSTAT) 0.4 MG SL tablet Place 1 tablet (0.4 mg total) under the tongue every 5 (five) minutes as needed for chest pain. 25 tablet 2  . oxyCODONE (OXY IR/ROXICODONE) 5 MG immediate release tablet Take 5 mg by mouth 3 (three) times daily as needed for severe pain (1 to 3 times daily as needed for pain.).    Marland Kitchen PARoxetine (PAXIL) 20 MG tablet Take 0.5 tablets (10 mg total) by mouth daily. Take 0.5 tablet by mouth twice daily (Patient taking differently: Take 10 mg by mouth daily. ) 90 tablet 3  . polyethylene glycol (MIRALAX / GLYCOLAX) packet Take 17 g by mouth daily.     No current facility-administered medications on file prior to visit.    No Known Allergies  Past Medical History  Diagnosis Date  . Degenerative disc disease   . Spinal stenosis   . Facet joint disease   . Bulging disc   . MRSA (methicillin resistant staph aureus) culture positive 2011    GREAT TOE RIGHT FOOT  .  Fibromyalgia   . Peripheral neuropathy   . MCL deficiency, knee   . Coronary arteritis   . CAD (coronary artery disease) 2009    s/p stent to LAD  . Hyperlipidemia   . Heart disease   . 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 Chicken pox   . Anemia     years ago  . Sleep apnea     uses CPAP, sleep study at [facility]    Past Surgical History  Procedure Laterality Date  . Foot surgery Right     BIG TOE  . Hand surgery Right   . Gallbladder surgery    . Ablation      UTERUS  . Ablation      HEART  . Heart stent  2009    LAD  . Hand surgey Left   . Foot surgery Bilateral     PLANTAR FASCIITIS  . Foot surgery Right     2ND TOE  .  Infusion pump implantation      X2 with morphine and baclofen  . Eye surgery Bilateral   . Back surgery      X 3 1979, 1994, 1995  . Fracture surgery Right 2012    carpal tunnel  . Cholecystectomy  2003  . Anterior cervical decomp/discectomy fusion N/A 07/28/2014    Procedure: ANTERIOR CERVICAL DECOMPRESSION/DISCECTOMY FUSION CERVICAL 3-4,4-5,5-6 LEVELS WITH INSTRUMENTATION AND ALLOGRAFT;  Surgeon: Sinclair Ship, MD;  Location: [facility];  Service: Orthopedics;  Laterality: N/A;  Anterior cervical decompression fusion, cervical 3-4, cervical 4-5, cervical 5-6 with instrumentation and allograft    Family History  Problem Relation Age of Onset  . Cancer Mother     lung  . Diabetes Father   . Heart disease Maternal Grandfather   . Diabetes Paternal Grandmother   . Cancer Paternal Grandfather     colon  . Stroke Neg Hx     History   Social History  . Marital Status: Single    Spouse Name: N/A  . Number of Children: N/A  . Years of Education: N/A   Occupational History  . Not on file.   Social History Main Topics  . Smoking status: Former Smoker -- 1.50 packs/day for 32 years    Types: Cigarettes    Quit date: 07/22/2007  . Smokeless tobacco: Never Used  . Alcohol Use: No  . Drug Use: No  . Sexual Activity: Yes   Other Topics Concern  . Not on file   Social History Narrative   The PMH, PSH, Social History, Family History, Medications, and allergies have been reviewed in [facility], and have been updated if relevant.    Review of Systems  Constitutional: Negative.   Respiratory: Negative.   Cardiovascular: Negative.   Genitourinary: Negative.   Musculoskeletal: Positive for gait problem.  Hematological: Negative.   All other systems reviewed and are negative.      Objective:    BP 112/66 mmHg  Pulse 76  Temp(Src) 98.3 F (36.8 C) (Oral)  Wt 211 lb 8 oz (95.936 kg)  SpO2 97%   Physical Exam  Constitutional: She is oriented to person, place, and time.  She appears well-developed and well-nourished. No distress.  HENT:  Head: Normocephalic.  Eyes: Conjunctivae are normal.  Cardiovascular: Normal rate.   Pulmonary/Chest: Effort normal.  Musculoskeletal:       Right foot: There is deformity.       Feet:  Neurological: She is alert and oriented to person,  place, and time. No cranial nerve deficit.  Skin: Skin is warm and dry.  Psychiatric: She has a normal mood and affect. Her behavior is normal. Thought content normal.  Nursing note and vitals reviewed.         Assessment & Plan:   Type 2 diabetes mellitus without complication - Plan: Hemoglobin A1c No Follow-up on file.

## 2014-10-25 NOTE — Assessment & Plan Note (Signed)
Diet controlled. a1c due- check today. Will defer to PCP for further management.

## 2014-10-25 NOTE — Progress Notes (Signed)
Pre visit review using our clinic review tool, if applicable. No additional management support is needed unless otherwise documented below in the visit note. 

## 2014-10-25 NOTE — Assessment & Plan Note (Signed)
With neuropathy and would clearly benefit from custom shoes/inserts even though all of her symptoms are not directly caused by her diabetes.  Certainly, being a diabetic worsens the possible complications and I do recommend she get these shoes.  Rx written and will be faxed directly to [employer].

## 2014-10-26 ENCOUNTER — Telehealth: Payer: Self-pay | Admitting: *Deleted

## 2014-10-26 NOTE — Addendum Note (Signed)
Addended by: Ellamae Sia on: 10/26/2014 11:42 AM   Modules accepted: Orders

## 2014-10-26 NOTE — Telephone Encounter (Signed)
Patient calling to follow up.  She left a form to be filled out by Dr. Deborra Medina for diabetic shoes.  This was to be faxed to [employer].  Please call patient to follow up on the status.

## 2014-10-26 NOTE — Telephone Encounter (Signed)
Spoke to pt and informed her that i faxed the form yesterday. Form given to [facility] in the event that is not received. Our phone lines were down on yesterday, due to area changes and pt was advised it may or may not have gone through. Pt states she has spoken with [employer] and they did not receive info. Threasa Beards advised so that form can be refaxed

## 2014-10-27 NOTE — Telephone Encounter (Signed)
Patient calling back.  The company still has not received fax.  Patient is requesting that the form be mailed to the address on the pamphlet.

## 2014-10-27 NOTE — Telephone Encounter (Signed)
Left detailed msg on VM per HIPAA Letting pt know form had been faxed again--there is not an address on form to mail it to and also it may take a little longer for me to send than regular post office.Marland KitchenMarland Kitchen

## 2014-10-27 NOTE — Telephone Encounter (Signed)
Form re-faxed

## 2014-10-28 DIAGNOSIS — M2560 Stiffness of unspecified joint, not elsewhere classified: Secondary | ICD-10-CM | POA: Diagnosis not present

## 2014-10-28 DIAGNOSIS — M542 Cervicalgia: Secondary | ICD-10-CM | POA: Diagnosis not present

## 2014-11-01 NOTE — Telephone Encounter (Signed)
Spoke to pt and she states the office did receive the fax and will send to scan

## 2014-11-05 ENCOUNTER — Other Ambulatory Visit: Payer: Self-pay | Admitting: Internal Medicine

## 2014-11-05 ENCOUNTER — Other Ambulatory Visit: Payer: Self-pay | Admitting: Cardiology

## 2014-11-11 ENCOUNTER — Encounter: Payer: Self-pay | Admitting: Physical Therapy

## 2014-11-11 ENCOUNTER — Ambulatory Visit: Payer: Medicare Other | Attending: Orthopedic Surgery | Admitting: Physical Therapy

## 2014-11-11 DIAGNOSIS — M436 Torticollis: Secondary | ICD-10-CM | POA: Diagnosis not present

## 2014-11-11 DIAGNOSIS — R29898 Other symptoms and signs involving the musculoskeletal system: Secondary | ICD-10-CM | POA: Insufficient documentation

## 2014-11-11 NOTE — Therapy (Signed)
[facility] [address], [place], [address] Phone: [phone]   Fax:  [phone]  Physical Therapy Treatment  Patient Details  Name: Melinda [name] MRN: [id number] Date of Birth: May 05, 1956 Referring Provider:  Jearld Fenton, NP  Encounter Date: 11/11/2014      PT End of Session - 11/11/14 1308    Visit Number 6   Number of Visits 10   Date for PT Re-Evaluation 11/11/14   PT Start Time 0100   PT Stop Time 0140   PT Time Calculation (min) 40 min   Activity Tolerance Patient limited by pain   Behavior During Therapy [facility] for tasks assessed/performed      Past Medical History  Diagnosis Date  . Degenerative disc disease   . Spinal stenosis   . Facet joint disease   . Bulging disc   . MRSA (methicillin resistant staph aureus) culture positive 2011    GREAT TOE RIGHT FOOT  . Fibromyalgia   . Peripheral neuropathy   . MCL deficiency, knee   . Coronary arteritis   . CAD (coronary artery disease) 2009    s/p stent to LAD  . Hyperlipidemia   . Heart disease   . 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 Chicken pox   . Anemia     years ago  . Sleep apnea     uses CPAP, sleep study at [facility]    Past Surgical History  Procedure Laterality Date  . Foot surgery Right     BIG TOE  . Hand surgery Right   . Gallbladder surgery    . Ablation      UTERUS  . Ablation      HEART  . Heart stent  2009    LAD  . Hand surgey Left   . Foot surgery Bilateral     PLANTAR FASCIITIS  . Foot surgery Right     2ND TOE  . Infusion pump implantation      X2 with morphine and baclofen  . Eye surgery Bilateral   . Back surgery      X 3 1979, 1994, 1995  . Fracture surgery Right 2012    carpal tunnel  . Cholecystectomy  2003  . Anterior cervical decomp/discectomy fusion N/A 07/28/2014    Procedure: ANTERIOR CERVICAL DECOMPRESSION/DISCECTOMY FUSION CERVICAL 3-4,4-5,5-6 LEVELS WITH  INSTRUMENTATION AND ALLOGRAFT;  Surgeon: Sinclair Ship, MD;  Location: [facility];  Service: Orthopedics;  Laterality: N/A;  Anterior cervical decompression fusion, cervical 3-4, cervical 4-5, cervical 5-6 with instrumentation and allograft    There were no vitals filed for this visit.  Visit Diagnosis:  Stiffness of cervical spine - Plan: PT plan of care cert/re-cert  Shoulder weakness - Plan: PT plan of care cert/re-cert      Subjective Assessment - 11/11/14 1303    Subjective Pt reports she is having significant pain with B shoulders, biceps tendon, reaching activities. currently pain is 4/10   Currently in Pain? Yes   Pain Score 4    Pain Location Shoulder   Pain Descriptors / Indicators Aching;Hervey Ard            [facility] PT Assessment - 11/11/14 0001    Assessment   Medical Diagnosis s/p ACDF   Onset Date 07/28/14   Next MD Visit 11/15/2014   Prior Therapy no   Precautions   Precautions None   Balance Screen   Has the patient fallen  in the past 6 months No   Has the patient had a decrease in activity level because of a fear of falling?  No   Is the patient reluctant to leave their home because of a fear of falling?  No   Prior Function   Level of Independence Independent with basic ADLs         Visit 6/10 for G-codes.  Objective:  Pt performed new exercises: 2# wt reaching 2x10 at 90 degrees, 120 degrees with extensive cuing and manual correction for improved scapular control.  "uppercut" performed with 2# wt 2x10 on each side. Modified to decr. Biceps pain.  Lateral fly, forward fly 3x10, PT educated pt on avoiding painful ROM and maintaining symmetry with performance.  D/c wall exercise due to incr. Wrist pain.  Pt demonstrated cervical rotation 3x10 with pause in neutral.                         PT Long Term Goals - 11/11/14 1342    PT LONG TERM GOAL #1   Title pt will improve cervical rotation by 10 degrees in both directions in  order to look over shoulder while driving.   Time 4   Period Weeks   Status Achieved   PT LONG TERM GOAL #2   Title pt will decr. NDI by 8 pts for reduced self reported disability.   Time 4   Period Weeks   Status Achieved   PT LONG TERM GOAL #3   Title Pt will demonstrate I with HEP without verbal cues.   Time 4   Period Weeks   Status Not Met   PT LONG TERM GOAL #4   Title pt will improve shoulder strength to 4+/5 to improve functional tolerance.   Time 4   Period Weeks   Status New               Plan - 11/11/14 1339    Clinical Impression Statement Pt presents with significant pain in B shoulders. ROM has improved to 80 degrees R, 70 degrees L  cervical rotation. cervical lateral flexion still limited and painful. Pain in cervical spine has improved significantly but shoulder pain has maintained at moderate level. Pt is consistent with exercises. In session PT issued new HEP focusing on strengthening cervicothoracic junction. Pt would benefit from additional PT to continue to improve strength and to address pain as appropriate.   Pt will benefit from skilled therapeutic intervention in order to improve on the following deficits Decreased mobility;Decreased strength;Pain   Rehab Potential Fair   Clinical Impairments Affecting Rehab Potential FMS,    PT Frequency 1x / week   PT Duration 4 weeks   PT Treatment/Interventions Therapeutic exercise;Manual techniques   Consulted and Agree with Plan of Care Patient        Problem List Patient Active Problem List   Diagnosis Date Noted  . Great toe amputation status 10/25/2014  . Compression fracture 07/28/2014  . DM2 (diabetes mellitus, type 2) 04/27/2014  . Angina decubitus 03/02/2014  . Pure hypercholesterolemia 03/02/2014  . Obesity (BMI 30-39.9) 10/20/2013  . Anxiety and depression 10/20/2013  . CAD (coronary artery disease) 10/04/2013  . Chronic pain syndrome 04/18/2010  . NEUROPATHY 04/18/2010  . CHRONIC  OSTEOMYELITIS ANKLE AND FOOT 04/18/2010    Fisher,Benjamin 11/11/2014, 1:48 PM  [facility] [address], [place], [address] Phone: [phone]   Fax:  [phone]

## 2014-11-12 ENCOUNTER — Other Ambulatory Visit: Payer: Self-pay

## 2014-11-12 ENCOUNTER — Telehealth: Payer: Self-pay

## 2014-11-12 MED ORDER — ISOSORBIDE DINITRATE 30 MG PO TABS: 30.0000 mg | ORAL_TABLET | Freq: Every day | ORAL | Status: DC

## 2014-11-12 NOTE — Telephone Encounter (Signed)
Pt said was going to take 6 weeks to get diabetic shoes and pt request rx to state pt needs 1 pair of custom inserts for pts shoes. Pt will pay out of pocket; not filing ins. Pt saw Dr Deborra Medina on 10/25/14. Pt request cb on [phone] when rx is ready for pick up.

## 2014-11-12 NOTE — Telephone Encounter (Signed)
PCP should be able to write this.

## 2014-11-15 ENCOUNTER — Other Ambulatory Visit: Payer: Self-pay | Admitting: Internal Medicine

## 2014-11-15 DIAGNOSIS — E119 Type 2 diabetes mellitus without complications: Secondary | ICD-10-CM

## 2014-11-15 DIAGNOSIS — M25511 Pain in right shoulder: Secondary | ICD-10-CM | POA: Diagnosis not present

## 2014-11-15 DIAGNOSIS — M542 Cervicalgia: Secondary | ICD-10-CM | POA: Diagnosis not present

## 2014-11-15 NOTE — Telephone Encounter (Signed)
Rx left in front office for pick up and pt is aware  

## 2014-11-15 NOTE — Telephone Encounter (Signed)
RX printed and signed, placed in MYD box

## 2014-11-22 ENCOUNTER — Other Ambulatory Visit: Payer: Self-pay | Admitting: Internal Medicine

## 2014-11-23 NOTE — Telephone Encounter (Signed)
Last filled 10/20/13 #90 with 3 refills--last OV with you was 09/10/14--no upcoming appts or CPE scheduled--please advise

## 2014-11-25 ENCOUNTER — Ambulatory Visit: Payer: Medicare Other | Admitting: Physical Therapy

## 2014-11-25 DIAGNOSIS — M542 Cervicalgia: Secondary | ICD-10-CM

## 2014-11-25 DIAGNOSIS — M436 Torticollis: Secondary | ICD-10-CM | POA: Diagnosis not present

## 2014-11-25 DIAGNOSIS — R29898 Other symptoms and signs involving the musculoskeletal system: Secondary | ICD-10-CM | POA: Diagnosis not present

## 2014-11-25 NOTE — Therapy (Signed)
[facility] [address], [place], [address] Phone: [phone]   Fax:  [phone]  Physical Therapy Treatment  Patient Details  Name: Melinda [name] MRN: [id number] Date of Birth: Oct 03, 1955 Referring Provider:  Jearld Fenton, NP  Encounter Date: 11/25/2014      PT End of Session - 11/25/14 1446    Visit Number 7   Number of Visits 10   Date for PT Re-Evaluation 12/12/14   PT Start Time 1410   PT Stop Time 1440   PT Time Calculation (min) 30 min   Activity Tolerance Patient tolerated treatment well   Behavior During Therapy [facility] for tasks assessed/performed      Past Medical History  Diagnosis Date  . Degenerative disc disease   . Spinal stenosis   . Facet joint disease   . Bulging disc   . MRSA (methicillin resistant staph aureus) culture positive 2011    GREAT TOE RIGHT FOOT  . Fibromyalgia   . Peripheral neuropathy   . MCL deficiency, knee   . Coronary arteritis   . CAD (coronary artery disease) 2009    s/p stent to LAD  . Hyperlipidemia   . Heart disease   . 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 Chicken pox   . Anemia     years ago  . Sleep apnea     uses CPAP, sleep study at [facility]    Past Surgical History  Procedure Laterality Date  . Foot surgery Right     BIG TOE  . Hand surgery Right   . Gallbladder surgery    . Ablation      UTERUS  . Ablation      HEART  . Heart stent  2009    LAD  . Hand surgey Left   . Foot surgery Bilateral     PLANTAR FASCIITIS  . Foot surgery Right     2ND TOE  . Infusion pump implantation      X2 with morphine and baclofen  . Eye surgery Bilateral   . Back surgery      X 3 1979, 1994, 1995  . Fracture surgery Right 2012    carpal tunnel  . Cholecystectomy  2003  . Anterior cervical decomp/discectomy fusion N/A 07/28/2014    Procedure: ANTERIOR CERVICAL DECOMPRESSION/DISCECTOMY FUSION CERVICAL 3-4,4-5,5-6  LEVELS WITH INSTRUMENTATION AND ALLOGRAFT;  Surgeon: Sinclair Ship, MD;  Location: [facility];  Service: Orthopedics;  Laterality: N/A;  Anterior cervical decompression fusion, cervical 3-4, cervical 4-5, cervical 5-6 with instrumentation and allograft    There were no vitals filed for this visit.  Visit Diagnosis:  Cervical pain (neck)      Subjective Assessment - 11/25/14 1444    Subjective pt c/o high degree of pain in neck and shoulders. Pt brought new order to try trigger point dry  needling per MD.   Currently in Pain? Yes   Pain Score 6    Pain Location Neck   Pain Orientation Mid   Pain Descriptors / Indicators Aching;Constant   Pain Onset More than a month ago   Pain Frequency Constant   Multiple Pain Sites No           Objective: Educated pt on expectations for trigger point dry needling.  Prone trigger point dry needling performed on B suboccipitals, UT, LS, multiple insertions with piston and twist technique. Pt reported pain improved to 4/10 with this.  Supine trigger point dry needling performed on Biceps tendon B. Pt c/o high degree of pian with R biceps insertion with notable local twitch response. Following this overall pain improved to 3/10.  PT encouraged pt to continue with stretching exercises, pt demonstrated improvement in ROM and tolerance for activity following session.  Pt arrived late for session.                     PT Education - 11/25/14 1446    Education provided Yes   Education Details educated pt on trigger point dry needling and expectations for response to PT   Person(s) Educated Patient   Methods Explanation   Comprehension Verbalized understanding             PT Long Term Goals - 11/11/14 1342    PT LONG TERM GOAL #1   Title pt will improve cervical rotation by 10 degrees in both directions in order to look over shoulder while driving.   Time 4   Period Weeks   Status Achieved   PT LONG TERM GOAL #2    Title pt will decr. NDI by 8 pts for reduced self reported disability.   Time 4   Period Weeks   Status Achieved   PT LONG TERM GOAL #3   Title Pt will demonstrate I with HEP without verbal cues.   Time 4   Period Weeks   Status Not Met   PT LONG TERM GOAL #4   Title pt will improve shoulder strength to 4+/5 to improve functional tolerance.   Time 4   Period Weeks   Status New               Plan - 11/25/14 1447    Clinical Impression Statement Within session pain in neck and Biceps improved from 6/10 to less than 3/10 with trigger point dry needling. She is continuing with her exercises on her own. Appears to be finally making improvement in pain - look to continue with TDN for two additional sessions to ensure good carryover effect with trigger point dry needling.   Pt will benefit from skilled therapeutic intervention in order to improve on the following deficits Decreased mobility;Decreased strength;Pain   Rehab Potential Fair   Clinical Impairments Affecting Rehab Potential FMS,    PT Frequency 1x / week   PT Duration 4 weeks        Problem List Patient Active Problem List   Diagnosis Date Noted  . Great toe amputation status 10/25/2014  . Compression fracture 07/28/2014  . DM2 (diabetes mellitus, type 2) 04/27/2014  . Angina decubitus 03/02/2014  . Pure hypercholesterolemia 03/02/2014  . Obesity (BMI 30-39.9) 10/20/2013  . Anxiety and depression 10/20/2013  . CAD (coronary artery disease) 10/04/2013  . Chronic pain syndrome 04/18/2010  . NEUROPATHY 04/18/2010  . CHRONIC OSTEOMYELITIS ANKLE AND FOOT 04/18/2010    Shahil Speegle 11/25/2014, 2:51 PM  [facility] [address], [place], [address] Phone: [phone]   Fax:  [phone]

## 2014-12-07 ENCOUNTER — Ambulatory Visit (INDEPENDENT_AMBULATORY_CARE_PROVIDER_SITE_OTHER): Payer: Medicare Other | Admitting: Internal Medicine

## 2014-12-07 ENCOUNTER — Encounter: Payer: Self-pay | Admitting: Internal Medicine

## 2014-12-07 VITALS — BP 124/68 | HR 78 | Temp 98.4°F | Wt 212.0 lb

## 2014-12-07 DIAGNOSIS — L255 Unspecified contact dermatitis due to plants, except food: Secondary | ICD-10-CM

## 2014-12-07 MED ORDER — PREDNISONE 10 MG PO TABS
ORAL_TABLET | ORAL | Status: DC
Start: 2014-12-07 — End: 2015-03-04

## 2014-12-07 NOTE — Progress Notes (Signed)
Subjective:    Patient ID: Melinda [name], female    DOB: [birth year], 59 y.o.   MRN: [id number]  HPI  Pt presents to the clinic today with c/o a rash on her arms and legs. She noticed this 3 days ago. The rash is very itchy. It is spreading. She thinks it is poison ivy. She was doing yard work late last week. She has tried Hydrocortisone 10 and Calydril gel with some relief of the itching, but the rash is not going away.  Review of Systems      Past Medical History  Diagnosis Date  . Degenerative disc disease   . Spinal stenosis   . Facet joint disease   . Bulging disc   . MRSA (methicillin resistant staph aureus) culture positive 2011    GREAT TOE RIGHT FOOT  . Fibromyalgia   . Peripheral neuropathy   . MCL deficiency, knee   . Coronary arteritis   . CAD (coronary artery disease) 2009    s/p stent to LAD  . Hyperlipidemia   . Heart disease   . 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 Chicken pox   . Anemia     years ago  . Sleep apnea     uses CPAP, sleep study at [facility]    Current Outpatient Prescriptions  Medication Sig Dispense Refill  . ACCU-CHEK SOFTCLIX LANCETS lancets 1-2 each by Other route daily. Use as instructed (Patient taking differently: 1 each by Other route See admin instructions. Check blood sugar once daily.) 100 each 2  . BACLOFEN PO 88.7 mcg by Intrathecal route daily. Continuous infusion with morphine pump.    . Blood Glucose Calibration (ACCU-CHEK AVIVA) SOLN 1 each by In Vitro route as needed. (Patient taking differently: 1 each by Other route See admin instructions. Check blood sugar once daily.) 1 each 1  . Blood Glucose Monitoring Suppl (ACCU-CHEK AVIVA PLUS) W/DEVICE KIT 1 Device by Does not apply route once. (Patient taking differently: 1 each by Other route See admin instructions. Check blood sugar once daily.) 1 kit 0  . buPROPion (WELLBUTRIN XL) 150 MG 24 hr tablet TAKE 1 TABLET BY MOUTH DAILY 90 tablet 3  . CRESTOR  20 MG tablet TAKE 1 TABLET BY MOUTH DAILY (Patient taking differently: Take 20 mg by mouth daily.) 30 tablet 10  . fentaNYL 5,000 mcg by Intrathecal route daily. 147.95 mcg/day    . glucose blood (ACCU-CHEK AVIVA PLUS) test strip 1-2 each by Other route 2 (two) times daily as needed. (Patient taking differently: 1 each by Other route See admin instructions. Check blood sugar once daily.) 100 each 5  . isosorbide dinitrate (ISORDIL) 30 MG tablet Take 1 tablet (30 mg total) by mouth daily. 30 tablet 6  . LYRICA 100 MG capsule Take 100 mg by mouth 2 (two) times daily.     . nitroGLYCERIN (NITROSTAT) 0.4 MG SL tablet Place 1 tablet (0.4 mg total) under the tongue every 5 (five) minutes as needed for chest pain. 25 tablet 2  . oxyCODONE (OXY IR/ROXICODONE) 5 MG immediate release tablet Take 5 mg by mouth 3 (three) times daily as needed for severe pain (1 to 3 times daily as needed for pain.).    Marland Kitchen PARoxetine (PAXIL) 20 MG tablet TAKE ONE-HALF (1/2) OF A TABLET BY MOUTHTWICE DAILY 90 tablet 0  . polyethylene glycol (MIRALAX / GLYCOLAX) packet Take 17 g by mouth daily.     No current facility-administered medications for  this visit.    No Known Allergies  Family History  Problem Relation Age of Onset  . Cancer Mother     lung  . Diabetes Father   . Heart disease Maternal Grandfather   . Diabetes Paternal Grandmother   . Cancer Paternal Grandfather     colon  . Stroke Neg Hx     History   Social History  . Marital Status: Single    Spouse Name: N/A  . Number of Children: N/A  . Years of Education: N/A   Occupational History  . Not on file.   Social History Main Topics  . Smoking status: Former Smoker -- 1.50 packs/day for 32 years    Types: Cigarettes    Quit date: 07/22/2007  . Smokeless tobacco: Never Used  . Alcohol Use: No  . Drug Use: No  . Sexual Activity: Yes   Other Topics Concern  . Not on file   Social History Narrative     Constitutional: Denies fever,  malaise, fatigue, headache or abrupt weight changes.  Respiratory: Denies difficulty breathing, shortness of breath, cough or sputum production.   Cardiovascular: Denies chest pain, chest tightness, palpitations or swelling in the hands or feet.  Skin: Pt reports rash. Denies  ulcercations.   No other specific complaints in a complete review of systems (except as listed in HPI above).  Objective:   Physical Exam   BP 124/68 mmHg  Pulse 78  Temp(Src) 98.4 F (36.9 C) (Oral)  Wt 212 lb (96.163 kg)  SpO2 98% Wt Readings from Last 3 Encounters:  12/07/14 212 lb (96.163 kg)  10/25/14 211 lb 8 oz (95.936 kg)  09/10/14 206 lb (93.441 kg)    General: Appears her stated age, well developed, well nourished in NAD. Skin: Vesicular lesions on a erythematous base noted in a linear pattern noted on bilateral arms and legs. Cardiovascular: Normal rate and rhythm. S1,S2 noted.  No murmur, rubs or gallops noted.  Pulmonary/Chest: Normal effort and positive vesicular breath sounds. No respiratory distress. No wheezes, rales or ronchi noted.   BMET    Component Value Date/Time   NA 140 07/21/2014 1340   NA 135* 06/30/2013 1203   K 4.2 07/21/2014 1340   K 4.1 06/30/2013 1203   CL 101 07/21/2014 1340   CL 102 06/30/2013 1203   CO2 30 07/21/2014 1340   CO2 32 06/30/2013 1203   GLUCOSE 103* 07/21/2014 1340   GLUCOSE 143* 06/30/2013 1203   BUN 18 07/21/2014 1340   BUN 19* 06/30/2013 1203   CREATININE 0.79 07/21/2014 1340   CREATININE 0.89 06/30/2013 1203   CALCIUM 9.1 07/21/2014 1340   CALCIUM 9.7 06/30/2013 1203   GFRNONAA >90 07/21/2014 1340   GFRNONAA >60 06/30/2013 1203   GFRAA >90 07/21/2014 1340   GFRAA >60 06/30/2013 1203    Lipid Panel     Component Value Date/Time   CHOL 93 04/27/2014 1036   TRIG 113.0 04/27/2014 1036   HDL 29.00* 04/27/2014 1036   CHOLHDL 3 04/27/2014 1036   VLDL 22.6 04/27/2014 1036   LDLCALC 41 04/27/2014 1036    CBC    Component Value Date/Time     WBC 7.1 07/21/2014 1340   WBC 10.6 11/12/2011 1136   RBC 4.41 07/21/2014 1340   RBC 3.47* 10/05/2013 0805   RBC 4.75 11/12/2011 1136   HGB 12.2 07/21/2014 1340   HGB 14.0 11/12/2011 1136   HCT 37.8 07/21/2014 1340   HCT 40.7 11/12/2011 1136   PLT  234 07/21/2014 1340   PLT 274 11/12/2011 1136   MCV 85.7 07/21/2014 1340   MCV 86 11/12/2011 1136   MCH 27.7 07/21/2014 1340   MCH 29.5 11/12/2011 1136   MCHC 32.3 07/21/2014 1340   MCHC 34.5 11/12/2011 1136   RDW 13.0 07/21/2014 1340   RDW 12.8 11/12/2011 1136   LYMPHSABS 1.7 07/21/2014 1340   LYMPHSABS 2.5 11/12/2011 1136   MONOABS 0.7 07/21/2014 1340   MONOABS 0.8 11/12/2011 1136   EOSABS 0.3 07/21/2014 1340   EOSABS 0.6 11/12/2011 1136   BASOSABS 0.0 07/21/2014 1340   BASOSABS 0.1 11/12/2011 1136    Hgb A1C Lab Results  Component Value Date   HGBA1C 6.2 04/27/2014        Assessment & Plan:   Contact Dermatitis due to Plant:  Looks like poison ivy or oak eRx for Pred taper Too widespread for topical steroid Benadryl as needed for itching  RTC as needed or if symptoms persist or worsen

## 2014-12-07 NOTE — Patient Instructions (Signed)
Poison Ivy Poison ivy is a inflammation of the skin (contact dermatitis) caused by touching the allergens on the leaves of the ivy plant following previous exposure to the plant. The rash usually appears 48 hours after exposure. The rash is usually bumps (papules) or blisters (vesicles) in a linear pattern. Depending on your own sensitivity, the rash may simply cause redness and itching, or it may also progress to blisters which may break open. These must be well cared for to prevent secondary bacterial (germ) infection, followed by scarring. Keep any open areas dry, clean, dressed, and covered with an antibacterial ointment if needed. The eyes may also get puffy. The puffiness is worst in the morning and gets better as the day progresses. This dermatitis usually heals without scarring, within 2 to 3 weeks without treatment. HOME CARE INSTRUCTIONS  Thoroughly wash with soap and water as soon as you have been exposed to poison ivy. You have about one half hour to remove the plant resin before it will cause the rash. This washing will destroy the oil or antigen on the skin that is causing, or will cause, the rash. Be sure to wash under your fingernails as any plant resin there will continue to spread the rash. Do not rub skin vigorously when washing affected area. Poison ivy cannot spread if no oil from the plant remains on your body. A rash that has progressed to weeping sores will not spread the rash unless you have not washed thoroughly. It is also important to wash any clothes you have been wearing as these may carry active allergens. The rash will return if you wear the unwashed clothing, even several days later. Avoidance of the plant in the future is the best measure. Poison ivy plant can be recognized by the number of leaves. Generally, poison ivy has three leaves with flowering branches on a single stem. Diphenhydramine may be purchased over the counter and used as needed for itching. Do not drive with  this medication if it makes you drowsy.Ask your caregiver about medication for children. SEEK MEDICAL CARE IF:  Open sores develop.  Redness spreads beyond area of rash.  You notice purulent (pus-like) discharge.  You have increased pain.  Other signs of infection develop (such as fever). Document Released: 06/15/2000 Document Revised: 09/10/2011 Document Reviewed: 11/26/2008 Exit[employer] Patient Information 2015 [employer]. This information is not intended to replace advice given to you by your health care provider. Make sure you discuss any questions you have with your health care provider.  

## 2014-12-07 NOTE — Progress Notes (Signed)
Pre visit review using our clinic review tool, if applicable. No additional management support is needed unless otherwise documented below in the visit note. 

## 2014-12-09 ENCOUNTER — Ambulatory Visit: Payer: Medicare Other | Admitting: Physical Therapy

## 2014-12-13 ENCOUNTER — Other Ambulatory Visit: Payer: Self-pay | Admitting: Cardiology

## 2014-12-13 DIAGNOSIS — I6523 Occlusion and stenosis of bilateral carotid arteries: Secondary | ICD-10-CM

## 2014-12-15 ENCOUNTER — Other Ambulatory Visit: Payer: Self-pay | Admitting: Internal Medicine

## 2014-12-15 ENCOUNTER — Telehealth: Payer: Self-pay | Admitting: Internal Medicine

## 2014-12-15 MED ORDER — TRIAMCINOLONE ACETONIDE 0.1 % EX CREA: 1.0000 "application " | TOPICAL_CREAM | Freq: Two times a day (BID) | CUTANEOUS | Status: DC

## 2014-12-15 NOTE — Telephone Encounter (Signed)
Have most of the lesions dried up?

## 2014-12-15 NOTE — Telephone Encounter (Signed)
Triamcinolone cream sent to [facility]

## 2014-12-15 NOTE — Telephone Encounter (Signed)
Pt states that she was in to see you last week for severe poison ivy.  She took the last prednisone 2-3 days ago and around 4:00 a.m. She began itching again.  She doesn't have any new spots but seems the old spots are starting all over again.  She states she has not taken any Benedryl b/c she couldn't sleep long. Wants to know if you want her to take another round of steroids or something else. Please advise pt. Thank you.

## 2014-12-15 NOTE — Telephone Encounter (Signed)
Pt states the areas did start to crust over but now that the Rx is finnished the same areas are beginning to itch again and become more red--pt states she would like to try a topical instead of another round of oral steroids--please send to [facility]

## 2014-12-16 ENCOUNTER — Ambulatory Visit: Payer: Medicare Other | Attending: Orthopedic Surgery | Admitting: Physical Therapy

## 2014-12-16 DIAGNOSIS — M436 Torticollis: Secondary | ICD-10-CM | POA: Diagnosis not present

## 2014-12-16 DIAGNOSIS — M542 Cervicalgia: Secondary | ICD-10-CM | POA: Diagnosis not present

## 2014-12-16 DIAGNOSIS — R29898 Other symptoms and signs involving the musculoskeletal system: Secondary | ICD-10-CM | POA: Diagnosis not present

## 2014-12-16 NOTE — Therapy (Signed)
[facility] [address] [place], [address] Phone: [phone]   Fax:  [phone]  Physical Therapy Treatment  Patient Details  Name: Melinda [name] MRN: [id number] Date of Birth: [birth year] Referring Provider:  Jearld Fenton, NP  Encounter Date: 12/16/2014      PT End of Session - 12/16/14 1522    Visit Number 8   Number of Visits 10   Date for PT Re-Evaluation 01/31/15   PT Start Time 1440   PT Stop Time 1523   PT Time Calculation (min) 43 min   Activity Tolerance Patient tolerated treatment well   Behavior During Therapy [facility] for tasks assessed/performed      Past Medical History  Diagnosis Date  . Degenerative disc disease   . Spinal stenosis   . Facet joint disease   . Bulging disc   . MRSA (methicillin resistant staph aureus) culture positive 2011    GREAT TOE RIGHT FOOT  . Fibromyalgia   . Peripheral neuropathy   . MCL deficiency, knee   . Coronary arteritis   . CAD (coronary artery disease) 2009    s/p stent to LAD  . Hyperlipidemia   . Heart disease   . 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 Chicken pox   . Anemia     years ago  . Sleep apnea     uses CPAP, sleep study at [facility]    Past Surgical History  Procedure Laterality Date  . Foot surgery Right     BIG TOE  . Hand surgery Right   . Gallbladder surgery    . Ablation      UTERUS  . Ablation      HEART  . Heart stent  2009    LAD  . Hand surgey Left   . Foot surgery Bilateral     PLANTAR FASCIITIS  . Foot surgery Right     2ND TOE  . Infusion pump implantation      X2 with morphine and baclofen  . Eye surgery Bilateral   . Back surgery      X 3 1979, 1994, 1995  . Fracture surgery Right 2012    carpal tunnel  . Cholecystectomy  2003  . Anterior cervical decomp/discectomy fusion N/A 07/28/2014    Procedure: ANTERIOR CERVICAL DECOMPRESSION/DISCECTOMY FUSION CERVICAL 3-4,4-5,5-6  LEVELS WITH INSTRUMENTATION AND ALLOGRAFT;  Surgeon: Sinclair Ship, MD;  Location: [place];  Service: Orthopedics;  Laterality: N/A;  Anterior cervical decompression fusion, cervical 3-4, cervical 4-5, cervical 5-6 with instrumentation and allograft    There were no vitals filed for this visit.  Visit Diagnosis:  Cervical pain (neck) - Plan: PT plan of care cert/re-cert  Stiffness of cervical spine - Plan: PT plan of care cert/re-cert      Subjective Assessment - 12/16/14 1448    Subjective Pt reports she had several good days. Following this she had several bad days, pain is primarily in her middle back and neck. Overall she was pleased with her previous session of PT with dry needling and would like to do this again.    Currently in Pain? Yes   Pain Score 6    Pain Location Neck   Pain Orientation Mid         Objective: Trigger point dry needling performed on B suboccipitals, B UT, R rhomboid.  Following this pt had improved tolerance for cervical flexion and rotation. Prior to  needling pt c/o pain with these motions in primarily T4 region.   Pt performed 3x10 cervical rotation and cervical flexion. Deferred cervical lateral flexion due to continued pain.  CPAs grade IV 3x1 min T3-T6. R UPAs in same region.  Pt required cuing with cervical flexion to avoid upper cervical extension with motion.                        PT Education - 12-20-2014 1454    Education provided Yes   Education Details Educated pt on stretching to reduce pain in T4 region.   Person(s) Educated Patient   Methods Explanation   Comprehension Verbalized understanding             PT Long Term Goals - 12-20-14 1524    PT LONG TERM GOAL #3   Title Pt will demonstrate I with HEP without verbal cues.   Time 8   Period Weeks   Status Partially Met   PT LONG TERM GOAL #4   Title pt will improve shoulder strength to 4+/5 to improve functional tolerance.   Time 8   Period  Weeks   Status Not Met               Plan - 12/20/2014 1523    Clinical Impression Statement Pt has made overall improvement in pain with trigger point dry needling with single session. Continued with same treatment today to improve response to exercise and other manual tx. Pt has inconsistent response overall to PT and would benefit from continued PT to allow at least 3-5 sessions of manual tx coupled with dry needling to attempt to maintain improvements in pain with treatment.   PT Frequency 1x / week   PT Duration 8 weeks   Consulted and Agree with Plan of Care Patient          G-Codes - December 20, 2014 1525    Functional Assessment Tool Used NPRS, cervical ROM   Functional Limitation Changing and maintaining body position   Changing and Maintaining Body Position Current Status (H5391) At least 20 percent but less than 40 percent impaired, limited or restricted   Changing and Maintaining Body Position Goal Status (S2583) At least 1 percent but less than 20 percent impaired, limited or restricted      Problem List Patient Active Problem List   Diagnosis Date Noted  . Great toe amputation status 10/25/2014  . Compression fracture 07/28/2014  . DM2 (diabetes mellitus, type 2) 04/27/2014  . Angina decubitus 03/02/2014  . Pure hypercholesterolemia 03/02/2014  . Obesity (BMI 30-39.9) 10/20/2013  . Anxiety and depression 10/20/2013  . CAD (coronary artery disease) 10/04/2013  . Chronic pain syndrome 04/18/2010  . NEUROPATHY 04/18/2010  . CHRONIC OSTEOMYELITIS ANKLE AND FOOT 04/18/2010    Fisher,Benjamin 12-20-2014, 5:02 PM  [facility] [address], [place], [address] Phone: [phone]   Fax:  [phone]

## 2014-12-23 ENCOUNTER — Ambulatory Visit: Payer: Medicare Other | Admitting: Physical Therapy

## 2014-12-23 DIAGNOSIS — R29898 Other symptoms and signs involving the musculoskeletal system: Secondary | ICD-10-CM | POA: Diagnosis not present

## 2014-12-23 DIAGNOSIS — M542 Cervicalgia: Secondary | ICD-10-CM | POA: Diagnosis not present

## 2014-12-23 DIAGNOSIS — M436 Torticollis: Secondary | ICD-10-CM | POA: Diagnosis not present

## 2014-12-23 NOTE — Therapy (Signed)
[facility] [address] [place], [address] Phone: [phone]   Fax:  [phone]  Physical Therapy Treatment  Patient Details  Name: Melinda [name] MRN: [id number] Date of Birth: 15-Dec-1955 Referring Provider:  Jearld Fenton, NP  Encounter Date: 12/23/2014      PT End of Session - 12/23/14 1521    Visit Number 9   Number of Visits 10   PT Start Time 1440   PT Stop Time 1521   PT Time Calculation (min) 41 min   Activity Tolerance Patient limited by pain      Past Medical History  Diagnosis Date  . Degenerative disc disease   . Spinal stenosis   . Facet joint disease   . Bulging disc   . MRSA (methicillin resistant staph aureus) culture positive 2011    GREAT TOE RIGHT FOOT  . Fibromyalgia   . Peripheral neuropathy   . MCL deficiency, knee   . Coronary arteritis   . CAD (coronary artery disease) 2009    s/p stent to LAD  . Hyperlipidemia   . Heart disease   . 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 Chicken pox   . Anemia     years ago  . Sleep apnea     uses CPAP, sleep study [facility]    Past Surgical History  Procedure Laterality Date  . Foot surgery Right     BIG TOE  . Hand surgery Right   . Gallbladder surgery    . Ablation      UTERUS  . Ablation      HEART  . Heart stent  2009    LAD  . Hand surgey Left   . Foot surgery Bilateral     PLANTAR FASCIITIS  . Foot surgery Right     2ND TOE  . Infusion pump implantation      X2 with morphine and baclofen  . Eye surgery Bilateral   . Back surgery      X 3 1979, 1994, 1995  . Fracture surgery Right 2012    carpal tunnel  . Cholecystectomy  2003  . Anterior cervical decomp/discectomy fusion N/A 07/28/2014    Procedure: ANTERIOR CERVICAL DECOMPRESSION/DISCECTOMY FUSION CERVICAL 3-4,4-5,5-6 LEVELS WITH INSTRUMENTATION AND ALLOGRAFT;  Surgeon: Sinclair Ship, MD;  Location: [place];  Service:  Orthopedics;  Laterality: N/A;  Anterior cervical decompression fusion, cervical 3-4, cervical 4-5, cervical 5-6 with instrumentation and allograft    There were no vitals filed for this visit.  Visit Diagnosis:  Cervical pain (neck)  Shoulder weakness      Subjective Assessment - 12/23/14 1446    Subjective Pt reports continued improvement in pain however each morning pt has had signficiant pain.    Currently in Pain? Yes   Pain Score 6    Pain Location Neck   Pain Orientation Mid             Objective: Trigger point dry needling performed on B UT. Extensive manual STM and scapular mobilization for periscapular region - noted significant tenderpoints at start of session which produced higher degree of pain than pt has had in the past. Most effective technique used strain-counterstrain which improved pt significantly with pain from 6/10 to 4/10.  Educated pt on use of thera-cane, pt able to perform well and reported this improved the pain in her back and neck.  Shoulder strech with arms behind, depressing  scapula and pulling with arms.  Arm swings 3x1 min with cuing to relax neck.                    PT Education - 12/23/14 1450    Education provided Yes   Education Details shoulder stretch HEP.   Person(s) Educated Patient   Methods Explanation   Comprehension Verbalized understanding             PT Long Term Goals - 12/16/14 1524    PT LONG TERM GOAL #3   Title Pt will demonstrate I with HEP without verbal cues.   Time 8   Period Weeks   Status Partially Met   PT LONG TERM GOAL #4   Title pt will improve shoulder strength to 4+/5 to improve functional tolerance.   Time 8   Period Weeks   Status Not Met               Plan - 12/23/14 1521    Clinical Impression Statement Pt appears to be making no improvement which she is able to maintain. Today pt reported her pain was very high and she expressed frustration both with her pain  as well as her inability to participate with her partner in normal tourist/walking related activities due to pain and dysfunction. As pt had no notable improvement in dry needling with this session look for alternatives for pain relief at next session.        Problem List Patient Active Problem List   Diagnosis Date Noted  . Great toe amputation status 10/25/2014  . Compression fracture 07/28/2014  . DM2 (diabetes mellitus, type 2) 04/27/2014  . Angina decubitus 03/02/2014  . Pure hypercholesterolemia 03/02/2014  . Obesity (BMI 30-39.9) 10/20/2013  . Anxiety and depression 10/20/2013  . CAD (coronary artery disease) 10/04/2013  . Chronic pain syndrome 04/18/2010  . NEUROPATHY 04/18/2010  . CHRONIC OSTEOMYELITIS ANKLE AND FOOT 04/18/2010    Yarnell Arvidson 12/23/2014, 3:24 PM  [facility] [address] [place], [address] Phone: [phone]   Fax:  [phone]

## 2014-12-24 ENCOUNTER — Ambulatory Visit (HOSPITAL_COMMUNITY): Payer: Medicare Other | Attending: Cardiovascular Disease

## 2014-12-24 DIAGNOSIS — R55 Syncope and collapse: Secondary | ICD-10-CM | POA: Diagnosis not present

## 2014-12-24 DIAGNOSIS — E785 Hyperlipidemia, unspecified: Secondary | ICD-10-CM | POA: Insufficient documentation

## 2014-12-24 DIAGNOSIS — I6523 Occlusion and stenosis of bilateral carotid arteries: Secondary | ICD-10-CM | POA: Diagnosis not present

## 2014-12-24 DIAGNOSIS — Z87891 Personal history of nicotine dependence: Secondary | ICD-10-CM | POA: Insufficient documentation

## 2014-12-24 DIAGNOSIS — I2510 Atherosclerotic heart disease of native coronary artery without angina pectoris: Secondary | ICD-10-CM | POA: Insufficient documentation

## 2014-12-24 DIAGNOSIS — R42 Dizziness and giddiness: Secondary | ICD-10-CM | POA: Insufficient documentation

## 2014-12-24 HISTORY — DX: Occlusion and stenosis of bilateral carotid arteries: I65.23

## 2014-12-27 ENCOUNTER — Other Ambulatory Visit: Payer: Self-pay

## 2014-12-30 ENCOUNTER — Ambulatory Visit: Payer: Medicare Other | Admitting: Physical Therapy

## 2014-12-30 DIAGNOSIS — M542 Cervicalgia: Secondary | ICD-10-CM | POA: Diagnosis not present

## 2014-12-30 DIAGNOSIS — M436 Torticollis: Secondary | ICD-10-CM | POA: Diagnosis not present

## 2014-12-30 DIAGNOSIS — R29898 Other symptoms and signs involving the musculoskeletal system: Secondary | ICD-10-CM | POA: Diagnosis not present

## 2014-12-30 NOTE — Therapy (Signed)
[facility] [address], [place], [address] Phone: [phone]   Fax:  [phone]  Physical Therapy Treatment  Patient Details  Name: Melinda [name] MRN: [id number] Date of Birth: 11/24/1955 Referring Provider:  Jearld Fenton, NP  Encounter Date: 12/30/2014      PT End of Session - 12/30/14 1507    Visit Number 10   Number of Visits 20   PT Start Time 8527   PT Stop Time 1504   PT Time Calculation (min) 39 min   Activity Tolerance Patient limited by pain   Behavior During Therapy [facility] for tasks assessed/performed      Past Medical History  Diagnosis Date  . Degenerative disc disease   . Spinal stenosis   . Facet joint disease   . Bulging disc   . MRSA (methicillin resistant staph aureus) culture positive 2011    GREAT TOE RIGHT FOOT  . Fibromyalgia   . Peripheral neuropathy   . MCL deficiency, knee   . Coronary arteritis   . CAD (coronary artery disease) 2009    s/p stent to LAD  . Hyperlipidemia   . Heart disease   . 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 Chicken pox   . Anemia     years ago  . Sleep apnea     uses CPAP, sleep study at [facility]    Past Surgical History  Procedure Laterality Date  . Foot surgery Right     BIG TOE  . Hand surgery Right   . Gallbladder surgery    . Ablation      UTERUS  . Ablation      HEART  . Heart stent  2009    LAD  . Hand surgey Left   . Foot surgery Bilateral     PLANTAR FASCIITIS  . Foot surgery Right     2ND TOE  . Infusion pump implantation      X2 with morphine and baclofen  . Eye surgery Bilateral   . Back surgery      X 3 1979, 1994, 1995  . Fracture surgery Right 2012    carpal tunnel  . Cholecystectomy  2003  . Anterior cervical decomp/discectomy fusion N/A 07/28/2014    Procedure: ANTERIOR CERVICAL DECOMPRESSION/DISCECTOMY FUSION CERVICAL 3-4,4-5,5-6 LEVELS WITH INSTRUMENTATION AND ALLOGRAFT;   Surgeon: Sinclair Ship, MD;  Location: [place];  Service: Orthopedics;  Laterality: N/A;  Anterior cervical decompression fusion, cervical 3-4, cervical 4-5, cervical 5-6 with instrumentation and allograft    There were no vitals filed for this visit.  Visit Diagnosis:  Cervical pain (neck)      Subjective Assessment - 12/30/14 1433    Subjective Pt reports she has ordered a theracane and has used it for several days, she feels it is helping significantly with her pain.   Currently in Pain? Yes   Pain Score 2    Pain Location Neck           Objective Trigger point dry needling performed on suboccipitals, B UT, L paraspinals at T5 (pt has been previously issued warning about risk of pneumothoraces).  Following this, pt performed thoracic extensions 3x10. Decr. Pain following dry needling with exercise.  CPAs grade IV 3x1 min T2-T5.   Following treatment pt reported pain improved to 3/10.  PT Long Term Goals - 12/16/14 1524    PT LONG TERM GOAL #3   Title Pt will demonstrate I with HEP without verbal cues.   Time 8   Period Weeks   Status Partially Met   PT LONG TERM GOAL #4   Title pt will improve shoulder strength to 4+/5 to improve functional tolerance.   Time 8   Period Weeks   Status Not Met               Plan - 2015/01/12 1509    Clinical Impression Statement Pt now has improvement in pain. She has been using theracane and feels it has improved her pain significantly. Pt will be seen for one additional session to ensure her pain level is maintained.          G-Codes - 01-12-2015 1516    Functional Assessment Tool Used NPRS, cervical ROM   Functional Limitation Changing and maintaining body position   Changing and Maintaining Body Position Current Status (U8891) At least 1 percent but less than 20 percent impaired, limited or restricted      Problem List Patient Active Problem List   Diagnosis  Date Noted  . Great toe amputation status 10/25/2014  . Compression fracture 07/28/2014  . DM2 (diabetes mellitus, type 2) 04/27/2014  . Angina decubitus 03/02/2014  . Pure hypercholesterolemia 03/02/2014  . Obesity (BMI 30-39.9) 10/20/2013  . Anxiety and depression 10/20/2013  . CAD (coronary artery disease) 10/04/2013  . Chronic pain syndrome 04/18/2010  . NEUROPATHY 04/18/2010  . CHRONIC OSTEOMYELITIS ANKLE AND FOOT 04/18/2010    Fisher,Benjamin 2015-01-12, 3:17 PM  [facility] [address] [place], [address] Phone: [phone]   Fax:  [phone]

## 2015-01-04 DIAGNOSIS — Z79891 Long term (current) use of opiate analgesic: Secondary | ICD-10-CM | POA: Diagnosis not present

## 2015-01-20 ENCOUNTER — Ambulatory Visit: Payer: Medicare Other | Attending: Orthopedic Surgery | Admitting: Physical Therapy

## 2015-01-20 DIAGNOSIS — M542 Cervicalgia: Secondary | ICD-10-CM | POA: Diagnosis not present

## 2015-01-20 DIAGNOSIS — R29898 Other symptoms and signs involving the musculoskeletal system: Secondary | ICD-10-CM | POA: Insufficient documentation

## 2015-01-20 NOTE — Therapy (Signed)
[facility] [address] [place], [address] Phone: [phone]   Fax:  [phone]  Physical Therapy Treatment  Patient Details  Name: Melinda [name] MRN: [id number] Date of Birth: [birth year] Referring Provider:  Jearld Fenton, NP  Encounter Date: 01/20/2015      PT End of Session - 01/20/15 1528    Visit Number 11   Number of Visits 20   Date for PT Re-Evaluation 01/31/15   PT Start Time 1515   PT Stop Time 1545   PT Time Calculation (min) 30 min      Past Medical History  Diagnosis Date  . Degenerative disc disease   . Spinal stenosis   . Facet joint disease   . Bulging disc   . MRSA (methicillin resistant staph aureus) culture positive 2011    GREAT TOE RIGHT FOOT  . Fibromyalgia   . Peripheral neuropathy   . MCL deficiency, knee   . Coronary arteritis   . CAD (coronary artery disease) 2009    s/p stent to LAD  . Hyperlipidemia   . Heart disease   . 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 Chicken pox   . Anemia     years ago  . Sleep apnea     uses CPAP, sleep study at [facility]    Past Surgical History  Procedure Laterality Date  . Foot surgery Right     BIG TOE  . Hand surgery Right   . Gallbladder surgery    . Ablation      UTERUS  . Ablation      HEART  . Heart stent  2009    LAD  . Hand surgey Left   . Foot surgery Bilateral     PLANTAR FASCIITIS  . Foot surgery Right     2ND TOE  . Infusion pump implantation      X2 with morphine and baclofen  . Eye surgery Bilateral   . Back surgery      X 3 1979, 1994, 1995  . Fracture surgery Right 2012    carpal tunnel  . Cholecystectomy  2003  . Anterior cervical decomp/discectomy fusion N/A 07/28/2014    Procedure: ANTERIOR CERVICAL DECOMPRESSION/DISCECTOMY FUSION CERVICAL 3-4,4-5,5-6 LEVELS WITH INSTRUMENTATION AND ALLOGRAFT;  Surgeon: Sinclair Ship, MD;  Location: [place];  Service:  Orthopedics;  Laterality: N/A;  Anterior cervical decompression fusion, cervical 3-4, cervical 4-5, cervical 5-6 with instrumentation and allograft    There were no vitals filed for this visit.  Visit Diagnosis:  Shoulder weakness  Cervical pain (neck)      Subjective Assessment - 01/20/15 1511    Subjective Pt tried kayaking and she has had periodic twinges of pain in her R biceps which is related to her neck, though she has noted definite decr. in muscle pain/spasm in neck.   Currently in Pain? No/denies              Objective: Trigger point dry needling performed on R biceps x2 with piston approach and multiple local twitch responses. Following this pt unable to produce sharp pain which she had previously been able to produce with biceps stretch.  Instructed and had pt perform doorknob biceps stretch 3x1 min. Cuing for maintaining thoracic posture with performance.  PT instructed pt to perform doorknob stretch when pain free to improve biceps ROM, also instructed pt to re-attempt performance of painful activities when they are irritating  to assess whether it is a quick catch or something more serious.                   PT Education - 01/20/15 1527    Education provided Yes   Education Details biceps doorway stretch.   Person(s) Educated Patient   Methods Explanation   Comprehension Verbalized understanding             PT Long Term Goals - 12/16/14 1524    PT LONG TERM GOAL #3   Title Pt will demonstrate I with HEP without verbal cues.   Time 8   Period Weeks   Status Partially Met   PT LONG TERM GOAL #4   Title pt will improve shoulder strength to 4+/5 to improve functional tolerance.   Time 8   Period Weeks   Status Not Met               Plan - 01/20/15 1537    Clinical Impression Statement Minimal to no neck pain, with return to more typical level of activity pt does report biceps pain and would benefit from one to two additional  sessions to address this to allow return to higher level function.        Problem List Patient Active Problem List   Diagnosis Date Noted  . Great toe amputation status 10/25/2014  . Compression fracture 07/28/2014  . DM2 (diabetes mellitus, type 2) 04/27/2014  . Angina decubitus 03/02/2014  . Pure hypercholesterolemia 03/02/2014  . Obesity (BMI 30-39.9) 10/20/2013  . Anxiety and depression 10/20/2013  . CAD (coronary artery disease) 10/04/2013  . Chronic pain syndrome 04/18/2010  . NEUROPATHY 04/18/2010  . CHRONIC OSTEOMYELITIS ANKLE AND FOOT 04/18/2010    Fisher,Benjamin 01/20/2015, 4:10 PM  [facility] [address], [place], [address] Phone: [phone]   Fax:  [phone]

## 2015-02-02 DIAGNOSIS — F329 Major depressive disorder, single episode, unspecified: Secondary | ICD-10-CM | POA: Diagnosis not present

## 2015-02-02 DIAGNOSIS — I499 Cardiac arrhythmia, unspecified: Secondary | ICD-10-CM | POA: Diagnosis not present

## 2015-02-02 DIAGNOSIS — M539 Dorsopathy, unspecified: Secondary | ICD-10-CM | POA: Diagnosis not present

## 2015-02-02 DIAGNOSIS — M545 Low back pain: Secondary | ICD-10-CM | POA: Diagnosis not present

## 2015-02-02 DIAGNOSIS — I2510 Atherosclerotic heart disease of native coronary artery without angina pectoris: Secondary | ICD-10-CM | POA: Diagnosis not present

## 2015-02-02 DIAGNOSIS — D649 Anemia, unspecified: Secondary | ICD-10-CM | POA: Diagnosis not present

## 2015-02-02 DIAGNOSIS — F419 Anxiety disorder, unspecified: Secondary | ICD-10-CM | POA: Diagnosis not present

## 2015-02-02 DIAGNOSIS — G8929 Other chronic pain: Secondary | ICD-10-CM | POA: Diagnosis not present

## 2015-02-02 DIAGNOSIS — G64 Other disorders of peripheral nervous system: Secondary | ICD-10-CM | POA: Diagnosis not present

## 2015-02-02 DIAGNOSIS — G4730 Sleep apnea, unspecified: Secondary | ICD-10-CM | POA: Diagnosis not present

## 2015-02-02 DIAGNOSIS — E119 Type 2 diabetes mellitus without complications: Secondary | ICD-10-CM | POA: Diagnosis not present

## 2015-02-02 DIAGNOSIS — Z79891 Long term (current) use of opiate analgesic: Secondary | ICD-10-CM | POA: Diagnosis not present

## 2015-02-10 ENCOUNTER — Ambulatory Visit: Payer: Medicare Other | Attending: Orthopedic Surgery | Admitting: Physical Therapy

## 2015-02-10 DIAGNOSIS — R29898 Other symptoms and signs involving the musculoskeletal system: Secondary | ICD-10-CM | POA: Diagnosis not present

## 2015-02-10 NOTE — Therapy (Signed)
[facility] [address], [place], [address] Phone: [phone]   Fax:  [phone]  Physical Therapy Treatment/DISCHARGE  Patient Details  Name: Melinda [name] MRN: [id number] Date of Birth: [birth year] Referring Provider:  Jearld Fenton, NP  Encounter Date: 02/10/2015      PT End of Session - 02/10/15 1331    Visit Number 12   Number of Visits 20   PT Start Time 3790   PT Stop Time 1320   PT Time Calculation (min) 25 min   Activity Tolerance Patient tolerated treatment well   Behavior During Therapy [facility] for tasks assessed/performed      Past Medical History  Diagnosis Date  . Degenerative disc disease   . Spinal stenosis   . Facet joint disease   . Bulging disc   . MRSA (methicillin resistant staph aureus) culture positive 2011    GREAT TOE RIGHT FOOT  . Fibromyalgia   . Peripheral neuropathy   . MCL deficiency, knee   . Coronary arteritis   . CAD (coronary artery disease) 2009    s/p stent to LAD  . Hyperlipidemia   . Heart disease   . 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 Chicken pox   . Anemia     years ago  . Sleep apnea     uses CPAP, sleep study at [facility]    Past Surgical History  Procedure Laterality Date  . Foot surgery Right     BIG TOE  . Hand surgery Right   . Gallbladder surgery    . Ablation      UTERUS  . Ablation      HEART  . Heart stent  2009    LAD  . Hand surgey Left   . Foot surgery Bilateral     PLANTAR FASCIITIS  . Foot surgery Right     2ND TOE  . Infusion pump implantation      X2 with morphine and baclofen  . Eye surgery Bilateral   . Back surgery      X 3 1979, 1994, 1995  . Fracture surgery Right 2012    carpal tunnel  . Cholecystectomy  2003  . Anterior cervical decomp/discectomy fusion N/A 07/28/2014    Procedure: ANTERIOR CERVICAL DECOMPRESSION/DISCECTOMY FUSION CERVICAL 3-4,4-5,5-6 LEVELS WITH INSTRUMENTATION  AND ALLOGRAFT;  Surgeon: Sinclair Ship, MD;  Location: [place];  Service: Orthopedics;  Laterality: N/A;  Anterior cervical decompression fusion, cervical 3-4, cervical 4-5, cervical 5-6 with instrumentation and allograft    There were no vitals filed for this visit.  Visit Diagnosis:  Shoulder weakness - Plan: PT plan of care cert/re-cert      Subjective Assessment - 02/10/15 1301    Subjective Pt reports she is having significant improvement in all pain related activity. Mild cervical spine pain on L side which pt feels may be related to her mowing her lawn over the weekend.   Currently in Pain? Yes   Pain Score 1    Pain Location Neck            Objective: Reassessed pt movement and strength. Performed STM and CPAs on middle thoracic spine region grade IV 3x1 min T3-T7.  Educated pt on performance of median nerve glide in standing if she has her "bicep"/median nerve pain.  Pt verbalized understanding of all educational topics.  PT Education - 02-11-15 1328    Education provided Yes   Education Details educated pt on median nerve stretch to address residual biceps pain.   Person(s) Educated Patient   Methods Explanation   Comprehension Verbalized understanding             PT Long Term Goals - 02-11-2015 1335    PT LONG TERM GOAL #3   Title Pt will demonstrate I with HEP without verbal cues.   Time 8   Period Weeks   Status Achieved   PT LONG TERM GOAL #4   Title pt will improve shoulder strength to 4+/5 to improve functional tolerance.   Time 8   Period Weeks   Status Achieved               Plan - 02-11-2015 1332    Clinical Impression Statement Pt is now appropriate for d/c as she has met all goals and is doing well.   Pt will benefit from skilled therapeutic intervention in order to improve on the following deficits Decreased mobility;Decreased strength;Pain   Rehab Potential Fair   PT Frequency 1x / week    PT Duration 8 weeks   PT Treatment/Interventions Therapeutic exercise;Manual techniques          G-Codes - 02-11-15 1335    Functional Assessment Tool Used NPRS, cervical ROM   Changing and Maintaining Body Position Current Status (S8546) At least 1 percent but less than 20 percent impaired, limited or restricted   Changing and Maintaining Body Position Goal Status (E7035) At least 1 percent but less than 20 percent impaired, limited or restricted      Problem List Patient Active Problem List   Diagnosis Date Noted  . Great toe amputation status 10/25/2014  . Compression fracture 07/28/2014  . DM2 (diabetes mellitus, type 2) 04/27/2014  . Angina decubitus 03/02/2014  . Pure hypercholesterolemia 03/02/2014  . Obesity (BMI 30-39.9) 10/20/2013  . Anxiety and depression 10/20/2013  . CAD (coronary artery disease) 10/04/2013  . Chronic pain syndrome 04/18/2010  . NEUROPATHY 04/18/2010  . CHRONIC OSTEOMYELITIS ANKLE AND FOOT 04/18/2010    Fisher,Benjamin 2015-02-11, 1:39 PM  [facility] [place] [facility] [address] [place], [address] Phone: [phone]   Fax:  [phone]

## 2015-02-25 DIAGNOSIS — M542 Cervicalgia: Secondary | ICD-10-CM | POA: Diagnosis not present

## 2015-02-28 ENCOUNTER — Other Ambulatory Visit: Payer: Self-pay | Admitting: Internal Medicine

## 2015-03-02 ENCOUNTER — Telehealth: Payer: Self-pay

## 2015-03-02 ENCOUNTER — Other Ambulatory Visit: Payer: Self-pay

## 2015-03-02 DIAGNOSIS — Z1231 Encounter for screening mammogram for malignant neoplasm of breast: Secondary | ICD-10-CM

## 2015-03-02 MED ORDER — ACCU-CHEK SOFTCLIX LANCETS MISC: 1.0000 | Freq: Two times a day (BID) | Status: DC

## 2015-03-02 NOTE — Telephone Encounter (Signed)
I called patient to notify her of being due for a mammogram. Patient stated that she normally goes to the [facility], and that she will call them today and schedule an appointment.

## 2015-03-02 NOTE — Addendum Note (Signed)
Addended by: Lurlean Nanny on: 03/02/2015 11:09 AM   Modules accepted: Orders

## 2015-03-03 ENCOUNTER — Other Ambulatory Visit: Payer: Self-pay | Admitting: Orthopedic Surgery

## 2015-03-03 DIAGNOSIS — M75101 Unspecified rotator cuff tear or rupture of right shoulder, not specified as traumatic: Secondary | ICD-10-CM

## 2015-03-03 DIAGNOSIS — M25511 Pain in right shoulder: Secondary | ICD-10-CM

## 2015-03-04 ENCOUNTER — Encounter: Payer: Self-pay | Admitting: Cardiology

## 2015-03-04 ENCOUNTER — Ambulatory Visit (INDEPENDENT_AMBULATORY_CARE_PROVIDER_SITE_OTHER): Payer: Medicare Other | Admitting: Cardiology

## 2015-03-04 VITALS — BP 130/74 | HR 82 | Ht 70.0 in | Wt 227.8 lb

## 2015-03-04 DIAGNOSIS — I6523 Occlusion and stenosis of bilateral carotid arteries: Secondary | ICD-10-CM

## 2015-03-04 DIAGNOSIS — E7800 Pure hypercholesterolemia, unspecified: Secondary | ICD-10-CM

## 2015-03-04 DIAGNOSIS — I2583 Coronary atherosclerosis due to lipid rich plaque: Principal | ICD-10-CM

## 2015-03-04 DIAGNOSIS — I2510 Atherosclerotic heart disease of native coronary artery without angina pectoris: Secondary | ICD-10-CM

## 2015-03-04 NOTE — Progress Notes (Signed)
[facility]. [address], [address] [place],   [address] Phone: [phone] Fax:  [phone]  Date:  03/04/2015   ID:  Melinda [name], DOB August 09, 1955, MRN [id number]  PCP:  [name], NP   History of Present Illness: Melinda [name] is a 59 y.o. 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 Cardiac catheterization 2012 revealed patent LAD stent.   She underwent a nuclear stress test 2014 which demonstrated mild anterolateral ischemia with normal ejection fraction. This was a very similar finding asked to prior stress test in August of 2012 which resulted in catheterization in August of 2012 which showed patent LAD stent, DES. There was a diagonal branch jailed by stent with minor luminal irregularity at the ostium. EF was normal. Because of these symptoms, I initiated metoprolol extender release 25 mg to see if this would help.  She felt crummy. Fatigue. Unsure if it's her chronic fatigue, fibromyalgia. We decided to initiate isosorbide. She did feel some head rushing sensation she was bending over or wiping her dogs paws. Her heart rate also has not been increasing as well as usual. She also feels more fatigued.   On 10/09/12 I decided to discontinue her metoprolol 25 mg. On Nov 14, 2012 she is here for followup after discontinuation of beta blocker.  02/18/13 - Doing well. Rare palpitation off metop. Taking [name] class. Enjoy.  03/02/14 - Hospitalized with strep infection. Sepsis.  Echocardiogram reassuring. Infectious disease consultation. Much improved. Fever was as high as 105 she says.   Trying to avoid metformin.A1c 6.9. Lost weight. Eating well. Exercising.   06/09/14-had an episode of chest discomfort that was concerning to her. Prompted this office visit today. She had an episode of pain was 6/10, mid low sternum,  tried to stretch, sat down, pain started to lessen took one nitroglycerin with relief, no nausea or vomiting diaphoresis shortness of breath. She noted some pain in the right side of her neck when she bent over. Pounding in right side of neck. Felt like she did prior to stent.  She had another milder episode of chest pain previous evening. Nitroglycerin improved. She was concerned about the sore spot on her neck. It seems to hurt to touch but it is not enlarged.Walking loosing weight. Not under stress.   03/04/15 - Had cervical spine surgery. Left hand first 3 fingers sometimes have tingling or numbness. Question carpal tunnel as well. No chest pain, no shortness of breath.    Wt Readings from Last 3 Encounters:  03/04/15 227 lb 12.8 oz (103.329 kg)  12/07/14 212 lb (96.163 kg)  10/25/14 211 lb 8 oz (95.936 kg)     Past Medical History  Diagnosis Date  . Degenerative disc disease   . Spinal stenosis   . Facet joint disease   . Bulging disc   . MRSA (methicillin resistant staph aureus) culture positive 2011    GREAT TOE RIGHT FOOT  . Fibromyalgia   . Peripheral neuropathy   . MCL deficiency, knee   . Coronary arteritis   . CAD (coronary artery disease) 2009    s/p stent to LAD  . Hyperlipidemia   . Heart disease   . 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 Chicken pox   . Anemia     years ago  . Sleep  apnea     uses CPAP, sleep study at [facility]    Past Surgical History  Procedure Laterality Date  . Foot surgery Right     BIG TOE  . Hand surgery Right   . Gallbladder surgery    . Ablation      UTERUS  . Ablation      HEART  . Heart stent  2009    LAD  . Hand surgey Left   . Foot surgery Bilateral     PLANTAR FASCIITIS  . Foot surgery Right     2ND TOE  . Infusion pump implantation      X2 with morphine and baclofen  . Eye surgery Bilateral   . Back surgery      X 3 1979, 1994, 1995  . Fracture surgery Right 2012    carpal tunnel  .  Cholecystectomy  2003  . Anterior cervical decomp/discectomy fusion N/A 07/28/2014    Procedure: ANTERIOR CERVICAL DECOMPRESSION/DISCECTOMY FUSION CERVICAL 3-4,4-5,5-6 LEVELS WITH INSTRUMENTATION AND ALLOGRAFT;  Surgeon: Sinclair Ship, MD;  Location: [facility];  Service: Orthopedics;  Laterality: N/A;  Anterior cervical decompression fusion, cervical 3-4, cervical 4-5, cervical 5-6 with instrumentation and allograft    Current Outpatient Prescriptions  Medication Sig Dispense Refill  . ACCU-CHEK SOFTCLIX LANCETS lancets 1 each by Other route 2 (two) times daily. Use as instructed 100 each 2  . BACLOFEN PO 88.7 mcg by Intrathecal route daily. Continuous infusion with morphine pump.    . Blood Glucose Calibration (ACCU-CHEK AVIVA) SOLN 1 each by In Vitro route as needed. (Patient taking differently: 1 each by Other route See admin instructions. Check blood sugar once daily.) 1 each 1  . Blood Glucose Monitoring Suppl (ACCU-CHEK AVIVA PLUS) W/DEVICE KIT 1 Device by Does not apply route once. (Patient taking differently: 1 each by Other route See admin instructions. Check blood sugar once daily.) 1 kit 0  . buPROPion (WELLBUTRIN XL) 150 MG 24 hr tablet TAKE 1 TABLET BY MOUTH DAILY 90 tablet 3  . CRESTOR 20 MG tablet TAKE 1 TABLET BY MOUTH DAILY (Patient taking differently: Take 20 mg by mouth daily.) 30 tablet 10  . cyclobenzaprine (FLEXERIL) 5 MG tablet     . fentaNYL 5,000 mcg by Intrathecal route daily. 147.95 mcg/day    . glucose blood (ACCU-CHEK AVIVA PLUS) test strip 1-2 each by Other route 2 (two) times daily as needed. (Patient taking differently: 1 each by Other route See admin instructions. Check blood sugar once daily.) 100 each 5  . isosorbide dinitrate (ISORDIL) 30 MG tablet Take 1 tablet (30 mg total) by mouth daily. 30 tablet 6  . LYRICA 100 MG capsule Take 100 mg by mouth 2 (two) times daily.     . nitroGLYCERIN (NITROSTAT) 0.4 MG SL tablet Place 1 tablet (0.4 mg total) under the  tongue every 5 (five) minutes as needed for chest pain. 25 tablet 2  . oxyCODONE (OXY IR/ROXICODONE) 5 MG immediate release tablet Take 5 mg by mouth 3 (three) times daily as needed for severe pain (1 to 3 times daily as needed for pain.).    Marland Kitchen PARoxetine (PAXIL) 20 MG tablet TAKE ONE-HALF (1/2) OF A TABLET BY MOUTHTWICE DAILY 90 tablet 0  . polyethylene glycol (MIRALAX / GLYCOLAX) packet Take 17 g by mouth daily.    Marland Kitchen tiZANidine (ZANAFLEX) 4 MG tablet     . UNABLE TO FIND 5.918 mg by Pump Prime route daily. Bupivicaine     No  current facility-administered medications for this visit.    Allergies:   No Known Allergies  Social History:  The patient  reports that she quit smoking about 7 years ago. Her smoking use included Cigarettes. She has a 48 pack-year smoking history. She has never used smokeless tobacco. She reports that she does not drink alcohol or use illicit drugs.   Family History  Problem Relation Age of Onset  . Cancer Mother     lung  . Diabetes Father   . Heart disease Maternal Grandfather   . Diabetes Paternal Grandmother   . Cancer Paternal Grandfather     colon  . Stroke Neg Hx     ROS:  Please see the history of present illness.   Denies any fevers, chills, orthopnea, PND, strokelike symptoms, shortness of breath   All other systems reviewed and negative.   PHYSICAL EXAM: VS:  BP 130/74 mmHg  Pulse 82  Ht 5' 10" (1.778 m)  Wt 227 lb 12.8 oz (103.329 kg)  BMI 32.69 kg/m2  SpO2 94% Well nourished, well developed, in no acute distress HEENT: normal, [place]/AT, EOMI Neck: no JVD, normal carotid upstroke, soft B bruit, she was tender to palpation, pinpoint over carotid artery in mid neck. Cardiac:  normal S1, S2; RRR; no murmur Lungs:  clear to auscultation bilaterally, no wheezing, rhonchi or rales Abd: soft, nontender, no hepatomegaly, no bruits Ext: no edema, 2+ distal pulses Skin: warm and dry GU: deferred Neuro: no focal abnormalities noted, AAO x 3  EKG:   06/09/14-sinus rhythm, 71, no other ST segment changes.  ASSESSMENT AND PLAN:  1. Carotid artery disease-bilateral 60-70% stenosis. We will continue to monitor. Asymptomatic. I offered her vascular referral but she would like to wait at this point. 2. Coronary artery disease-stable. EKG previously unremarkable. Low-dose isosorbide. She has been off of beta blocker for quite some time secondary to fatigue. She is doing well today. Blood pressure too low to advance any other antianginal medications. 3. Angina-well-controlled. She does have atypical sharp stabbing chest pains at times. Noncardiac. Isosorbide. I wonder if this episode in her lower mid epigastric sternal region was also muscular skeletal. EKG reassuring. 4. Obesity-doing well with weight loss.  She is trying to avoid medication for diabetes. Her prior hemoglobin A1c was 6.9. She is striving for diet control.  5. Hyperlipidemia-currently on Crestor. Doing well. No myalgias. LDL less than 70. Goal. Will see in follow up 12 months  Signed, Candee Furbish, MD [facility]  03/04/2015 2:00 PM

## 2015-03-04 NOTE — Patient Instructions (Signed)
Medication Instructions:  Your physician recommends that you continue on your current medications as directed. Please refer to the Current Medication list given to you today.  Follow-Up: Follow up in 1 year with Dr. Skains.  You will receive a letter in the mail 2 months before you are due.  Please call us when you receive this letter to schedule your follow up appointment.  Thank you for choosing [facility]!!       

## 2015-03-09 ENCOUNTER — Ambulatory Visit
Admission: RE | Admit: 2015-03-09 | Discharge: 2015-03-09 | Disposition: A | Discharge status: Home / Self Care | Payer: Medicare Other | Source: Ambulatory Visit

## 2015-03-09 DIAGNOSIS — Z1231 Encounter for screening mammogram for malignant neoplasm of breast: Secondary | ICD-10-CM | POA: Diagnosis not present

## 2015-03-10 ENCOUNTER — Ambulatory Visit
Admission: RE | Admit: 2015-03-10 | Discharge: 2015-03-10 | Disposition: A | Discharge status: Home / Self Care | Payer: Medicare Other | Source: Ambulatory Visit | Attending: Orthopedic Surgery | Admitting: Orthopedic Surgery

## 2015-03-10 DIAGNOSIS — M75101 Unspecified rotator cuff tear or rupture of right shoulder, not specified as traumatic: Secondary | ICD-10-CM | POA: Diagnosis present

## 2015-03-10 DIAGNOSIS — M7551 Bursitis of right shoulder: Secondary | ICD-10-CM | POA: Diagnosis not present

## 2015-03-10 DIAGNOSIS — M19011 Primary osteoarthritis, right shoulder: Secondary | ICD-10-CM | POA: Diagnosis not present

## 2015-03-10 DIAGNOSIS — M25511 Pain in right shoulder: Secondary | ICD-10-CM

## 2015-03-16 DIAGNOSIS — M25562 Pain in left knee: Secondary | ICD-10-CM | POA: Diagnosis not present

## 2015-03-16 DIAGNOSIS — E119 Type 2 diabetes mellitus without complications: Secondary | ICD-10-CM | POA: Diagnosis not present

## 2015-03-16 DIAGNOSIS — H2513 Age-related nuclear cataract, bilateral: Secondary | ICD-10-CM | POA: Diagnosis not present

## 2015-03-16 DIAGNOSIS — M25511 Pain in right shoulder: Secondary | ICD-10-CM | POA: Diagnosis not present

## 2015-03-16 DIAGNOSIS — H16223 Keratoconjunctivitis sicca, not specified as Sjogren's, bilateral: Secondary | ICD-10-CM | POA: Diagnosis not present

## 2015-03-16 LAB — HM DIABETES EYE EXAM

## 2015-03-29 ENCOUNTER — Other Ambulatory Visit: Payer: Self-pay | Admitting: Cardiology

## 2015-03-30 DIAGNOSIS — H04123 Dry eye syndrome of bilateral lacrimal glands: Secondary | ICD-10-CM | POA: Diagnosis not present

## 2015-04-04 ENCOUNTER — Encounter: Payer: Medicare Other | Admitting: Pain Medicine

## 2015-04-07 ENCOUNTER — Ambulatory Visit: Payer: Medicare Other | Attending: Pain Medicine | Admitting: Pain Medicine

## 2015-04-07 ENCOUNTER — Encounter: Payer: Self-pay | Admitting: Pain Medicine

## 2015-04-07 VITALS — BP 104/61 | HR 76 | Temp 98.3°F | Resp 18 | Ht 70.0 in | Wt 220.0 lb

## 2015-04-07 DIAGNOSIS — Z9889 Other specified postprocedural states: Secondary | ICD-10-CM

## 2015-04-07 DIAGNOSIS — E669 Obesity, unspecified: Secondary | ICD-10-CM | POA: Diagnosis not present

## 2015-04-07 DIAGNOSIS — Z87891 Personal history of nicotine dependence: Secondary | ICD-10-CM | POA: Insufficient documentation

## 2015-04-07 DIAGNOSIS — M5126 Other intervertebral disc displacement, lumbar region: Secondary | ICD-10-CM | POA: Insufficient documentation

## 2015-04-07 DIAGNOSIS — E1140 Type 2 diabetes mellitus with diabetic neuropathy, unspecified: Secondary | ICD-10-CM | POA: Insufficient documentation

## 2015-04-07 DIAGNOSIS — E7800 Pure hypercholesterolemia, unspecified: Secondary | ICD-10-CM | POA: Insufficient documentation

## 2015-04-07 DIAGNOSIS — M542 Cervicalgia: Secondary | ICD-10-CM | POA: Insufficient documentation

## 2015-04-07 DIAGNOSIS — F329 Major depressive disorder, single episode, unspecified: Secondary | ICD-10-CM | POA: Diagnosis not present

## 2015-04-07 DIAGNOSIS — G894 Chronic pain syndrome: Secondary | ICD-10-CM | POA: Insufficient documentation

## 2015-04-07 DIAGNOSIS — Z981 Arthrodesis status: Secondary | ICD-10-CM | POA: Insufficient documentation

## 2015-04-07 DIAGNOSIS — F419 Anxiety disorder, unspecified: Secondary | ICD-10-CM | POA: Insufficient documentation

## 2015-04-07 DIAGNOSIS — G629 Polyneuropathy, unspecified: Secondary | ICD-10-CM | POA: Diagnosis not present

## 2015-04-07 DIAGNOSIS — M549 Dorsalgia, unspecified: Secondary | ICD-10-CM | POA: Insufficient documentation

## 2015-04-07 DIAGNOSIS — I2510 Atherosclerotic heart disease of native coronary artery without angina pectoris: Secondary | ICD-10-CM | POA: Insufficient documentation

## 2015-04-07 MED ORDER — TIZANIDINE HCL 4 MG PO TABS: 4.0000 mg | ORAL_TABLET | Freq: Four times a day (QID) | ORAL | Status: DC | PRN

## 2015-04-07 MED ORDER — OXYCODONE HCL 5 MG PO TABS: 5.0000 mg | ORAL_TABLET | Freq: Three times a day (TID) | ORAL | Status: DC | PRN

## 2015-04-07 MED ORDER — PREGABALIN 100 MG PO CAPS: 100.0000 mg | ORAL_CAPSULE | Freq: Three times a day (TID) | ORAL | Status: DC

## 2015-04-07 NOTE — Progress Notes (Signed)
Safety precautions to be maintained throughout the outpatient stay will include: orient to surroundings, keep bed in low position, maintain call bell within reach at all times, provide assistance with transfer out of bed and ambulation.Oxycodone pill count # 86/90

## 2015-04-11 ENCOUNTER — Other Ambulatory Visit: Payer: Self-pay | Admitting: Pain Medicine

## 2015-04-11 DIAGNOSIS — Z9889 Other specified postprocedural states: Secondary | ICD-10-CM | POA: Insufficient documentation

## 2015-04-11 NOTE — Progress Notes (Signed)
Patient's Name: Melinda [name] MRN: [id number] DOB: 1956-03-04 DOS: 04/07/2015  Primary Reason(s) for Visit: Encounter for Medication Management. CC: Back Pain and Neck Pain   HPI:   Melinda [name] is a 59 y.o. year old, female patient, who returns today as an established patient. She has Chronic pain syndrome; Neuropathy ([place]); CAD (coronary artery disease); Obesity (BMI 30-39.9); Anxiety and depression; Angina decubitus ([place]); Pure hypercholesterolemia; DM2 (diabetes mellitus, type 2) ([place]); Great toe amputation status ([place]); and Personal history of spine surgery on her problem list.. Her primarily concern today is the Back Pain and Neck Pain      Pharmacotherapy Review: Side-effects or Adverse reactions: None reported. Effectiveness: Described as relatively effective, allowing for increase in activities of daily living (ADL). Onset of action: Within expected pharmacological parameters. Duration of action: Within normal limits for medication. Peak effect: Timing and results are as within normal expected parameters. [place] PMP: Compliant with practice rules and regulations. DST: Compliant with practice rules and regulations. Lab work: No new labs ordered by our practice. Treatment compliance: Compliant. Substance Use Disorder (SUD) Risk Level: Low Planned course of action: Continue therapy as is.  Motor Allergies: Melinda [name] has No Known Allergies.  Meds: The patient has a current medication list which includes the following prescription(s): accu-chek softclix lancets, aspirin, baclofen, accu-chek aviva, accu-chek aviva plus, bupropion, crestor, cyclobenzaprine, fentaNYL 5,000 mcg, glucose blood, isosorbide dinitrate, nitroglycerin, oxycodone, paroxetine, polyethylene glycol, pregabalin, tizanidine, UNABLE TO FIND, oxycodone, and oxycodone. Requested Prescriptions   Signed Prescriptions Disp Refills  . tiZANidine (ZANAFLEX) 4 MG tablet 90 tablet 2    Sig: Take 1 tablet (4 mg total) by mouth  every 6 (six) hours as needed.  . pregabalin (LYRICA) 100 MG capsule 90 capsule 2    Sig: Take 1 capsule (100 mg total) by mouth 3 (three) times daily.  Marland Kitchen oxyCODONE (OXY IR/ROXICODONE) 5 MG immediate release tablet 90 tablet 0    Sig: Take 1 tablet (5 mg total) by mouth every 8 (eight) hours as needed for severe pain (1 to 3 times daily as needed for pain.).  Marland Kitchen oxyCODONE (OXY IR/ROXICODONE) 5 MG immediate release tablet 90 tablet 0    Sig: Take 1 tablet (5 mg total) by mouth every 8 (eight) hours as needed for severe pain.  Marland Kitchen oxyCODONE (OXY IR/ROXICODONE) 5 MG immediate release tablet 90 tablet 0    Sig: Take 1 tablet (5 mg total) by mouth every 8 (eight) hours as needed for severe pain.    ROS: Constitutional: Afebrile, no chills, well hydrated and well nourished Gastrointestinal: negative Musculoskeletal:negative Neurological: negative Behavioral/Psych: negative  PFSH: Medical:  Melinda [name]  has a past medical history of Degenerative disc disease; Spinal stenosis; Facet joint disease; Bulging disc; MRSA (methicillin resistant staph aureus) culture positive (2011); Fibromyalgia; Peripheral neuropathy ([place]); MCL deficiency, knee; Coronary arteritis; CAD (coronary artery disease) (2009); Hyperlipidemia; Heart disease; Cardiac arrhythmia due to congenital heart disease; Chicken pox; Anemia; and Sleep apnea. Family: family history includes Cancer in her mother and paternal grandfather; Diabetes in her father and paternal grandmother; Heart disease in her maternal grandfather. There is no history of Stroke. Surgical:  has past surgical history that includes Foot surgery (Right); Hand surgery (Right); Gallbladder surgery; Ablation; Ablation; HEART STENT (2009); HAND SURGEY (Left); Foot surgery (Bilateral); Foot surgery (Right); Infusion pump implantation; Eye surgery (Bilateral); Back surgery; Fracture surgery (Right, 2012); Cholecystectomy (2003); and Anterior cervical decomp/discectomy fusion (N/A,  07/28/2014). Tobacco:  reports that she quit smoking about 7 years  ago. Her smoking use included Cigarettes. She has a 48 pack-year smoking history. She has never used smokeless tobacco. Alcohol:  reports that she does not drink alcohol. Drug:  reports that she does not use illicit drugs.  Physical Exam: Vitals:  Today's Vitals   04/07/15 1151 04/07/15 1153  BP: 104/61   Pulse: 76   Temp: 98.3 F (36.8 C)   Resp: 18   Height: 5\' 10"  (1.778 m)   Weight: 220 lb (99.791 kg)   SpO2: 97%   PainSc: 4  4   PainLoc: Back   Calculated BMI: Body mass index is 31.57 kg/(m^2). General appearance: alert, cooperative, appears stated age and no distress Eyes: conjunctivae/corneas clear. PERRL, EOM's intact. Fundi benign. Lungs: No evidence respiratory distress, no audible rales or ronchi and no use of accessory muscles of respiration Neck: no adenopathy, no carotid bruit, no JVD, supple, symmetrical, trachea midline and thyroid not enlarged, symmetric, no tenderness/mass/nodules Back: symmetric, no curvature. ROM normal. No CVA tenderness. Extremities: extremities normal, atraumatic, no cyanosis or edema Pulses: 2+ and symmetric Skin: Skin color, texture, turgor normal. No rashes or lesions Neurologic: Grossly normal    Assessment: Encounter Diagnosis:  Primary Diagnosis: Chronic pain syndrome [G89.4]  Plan: Jezell was seen today for back pain and neck pain.  Diagnoses and all orders for this visit:  Chronic pain syndrome  Neuropathy ([place])  Personal history of spine surgery Comments: ACDF (Cervical x1 Dr. Dumonsky)(07/2014); & Lumbar Spine Surgery x3 (last on 1995 by Dr. Durene Cal)  Other orders -     tiZANidine (ZANAFLEX) 4 MG tablet; Take 1 tablet (4 mg total) by mouth every 6 (six) hours as needed. -     pregabalin (LYRICA) 100 MG capsule; Take 1 capsule (100 mg total) by mouth 3 (three) times daily. -     oxyCODONE (OXY IR/ROXICODONE) 5 MG immediate release tablet; Take 1 tablet  (5 mg total) by mouth every 8 (eight) hours as needed for severe pain (1 to 3 times daily as needed for pain.). -     oxyCODONE (OXY IR/ROXICODONE) 5 MG immediate release tablet; Take 1 tablet (5 mg total) by mouth every 8 (eight) hours as needed for severe pain. -     oxyCODONE (OXY IR/ROXICODONE) 5 MG immediate release tablet; Take 1 tablet (5 mg total) by mouth every 8 (eight) hours as needed for severe pain.     There are no Patient Instructions on file for this visit. Medications discontinued today:  Medications Discontinued During This Encounter  Medication Reason  . tiZANidine (ZANAFLEX) 4 MG tablet Reorder  . LYRICA 100 MG capsule Reorder  . oxyCODONE (OXY IR/ROXICODONE) 5 MG immediate release tablet Reorder   Medications administered today:  Melinda [name] had no medications administered during this visit.  Primary Care Physician: [name], NP Location: [facility] Note by: Kathlen Brunswick. Dossie Arbour, M.D, DABA, DABAPM, DABPM, DABIPP, FIPP

## 2015-04-13 ENCOUNTER — Other Ambulatory Visit: Payer: Self-pay | Admitting: Pain Medicine

## 2015-04-14 MED ORDER — OXYCODONE HCL 5 MG PO TABS: 5.0000 mg | ORAL_TABLET | Freq: Three times a day (TID) | ORAL | Status: DC | PRN

## 2015-04-14 NOTE — Addendum Note (Signed)
Addended by: [name] A on: 04/14/2015 01:45 PM   Modules accepted: Orders

## 2015-05-05 ENCOUNTER — Other Ambulatory Visit: Payer: Self-pay

## 2015-05-05 MED ORDER — PAIN MANAGEMENT IT PUMP REFILL: 1.0000 | Freq: Once | INTRATHECAL | Status: DC

## 2015-05-24 ENCOUNTER — Ambulatory Visit: Payer: Medicare Other | Attending: Pain Medicine | Admitting: Pain Medicine

## 2015-05-24 ENCOUNTER — Encounter: Payer: Self-pay | Admitting: Pain Medicine

## 2015-05-24 VITALS — BP 107/63 | HR 18 | Temp 98.5°F | Resp 18 | Ht 69.0 in | Wt 230.0 lb

## 2015-05-24 DIAGNOSIS — M79605 Pain in left leg: Secondary | ICD-10-CM | POA: Insufficient documentation

## 2015-05-24 DIAGNOSIS — M792 Neuralgia and neuritis, unspecified: Secondary | ICD-10-CM | POA: Insufficient documentation

## 2015-05-24 DIAGNOSIS — I6523 Occlusion and stenosis of bilateral carotid arteries: Secondary | ICD-10-CM | POA: Diagnosis not present

## 2015-05-24 DIAGNOSIS — F419 Anxiety disorder, unspecified: Secondary | ICD-10-CM | POA: Diagnosis not present

## 2015-05-24 DIAGNOSIS — Z7189 Other specified counseling: Secondary | ICD-10-CM

## 2015-05-24 DIAGNOSIS — F329 Major depressive disorder, single episode, unspecified: Secondary | ICD-10-CM | POA: Diagnosis not present

## 2015-05-24 DIAGNOSIS — M961 Postlaminectomy syndrome, not elsewhere classified: Secondary | ICD-10-CM | POA: Diagnosis not present

## 2015-05-24 DIAGNOSIS — M542 Cervicalgia: Secondary | ICD-10-CM | POA: Insufficient documentation

## 2015-05-24 DIAGNOSIS — M62838 Other muscle spasm: Secondary | ICD-10-CM | POA: Insufficient documentation

## 2015-05-24 DIAGNOSIS — F1190 Opioid use, unspecified, uncomplicated: Secondary | ICD-10-CM | POA: Insufficient documentation

## 2015-05-24 DIAGNOSIS — E669 Obesity, unspecified: Secondary | ICD-10-CM | POA: Insufficient documentation

## 2015-05-24 DIAGNOSIS — M47816 Spondylosis without myelopathy or radiculopathy, lumbar region: Secondary | ICD-10-CM | POA: Insufficient documentation

## 2015-05-24 DIAGNOSIS — M47812 Spondylosis without myelopathy or radiculopathy, cervical region: Secondary | ICD-10-CM | POA: Insufficient documentation

## 2015-05-24 DIAGNOSIS — M797 Fibromyalgia: Secondary | ICD-10-CM | POA: Insufficient documentation

## 2015-05-24 DIAGNOSIS — M5416 Radiculopathy, lumbar region: Secondary | ICD-10-CM

## 2015-05-24 DIAGNOSIS — Z978 Presence of other specified devices: Secondary | ICD-10-CM | POA: Insufficient documentation

## 2015-05-24 DIAGNOSIS — G894 Chronic pain syndrome: Secondary | ICD-10-CM | POA: Diagnosis not present

## 2015-05-24 DIAGNOSIS — F1120 Opioid dependence, uncomplicated: Secondary | ICD-10-CM

## 2015-05-24 DIAGNOSIS — G9619 Other disorders of meninges, not elsewhere classified: Secondary | ICD-10-CM | POA: Diagnosis not present

## 2015-05-24 DIAGNOSIS — Z9689 Presence of other specified functional implants: Secondary | ICD-10-CM | POA: Insufficient documentation

## 2015-05-24 DIAGNOSIS — Z451 Encounter for adjustment and management of infusion pump: Secondary | ICD-10-CM | POA: Insufficient documentation

## 2015-05-24 DIAGNOSIS — M539 Dorsopathy, unspecified: Secondary | ICD-10-CM | POA: Diagnosis not present

## 2015-05-24 DIAGNOSIS — M7918 Myalgia, other site: Secondary | ICD-10-CM

## 2015-05-24 DIAGNOSIS — M6249 Contracture of muscle, multiple sites: Secondary | ICD-10-CM

## 2015-05-24 DIAGNOSIS — Z462 Encounter for fitting and adjustment of other devices related to nervous system and special senses: Secondary | ICD-10-CM

## 2015-05-24 DIAGNOSIS — Z9889 Other specified postprocedural states: Secondary | ICD-10-CM | POA: Insufficient documentation

## 2015-05-24 DIAGNOSIS — M7632 Iliotibial band syndrome, left leg: Secondary | ICD-10-CM

## 2015-05-24 DIAGNOSIS — E7800 Pure hypercholesterolemia, unspecified: Secondary | ICD-10-CM | POA: Diagnosis not present

## 2015-05-24 DIAGNOSIS — Z79899 Other long term (current) drug therapy: Secondary | ICD-10-CM | POA: Insufficient documentation

## 2015-05-24 DIAGNOSIS — I2510 Atherosclerotic heart disease of native coronary artery without angina pectoris: Secondary | ICD-10-CM | POA: Diagnosis not present

## 2015-05-24 DIAGNOSIS — Z79891 Long term (current) use of opiate analgesic: Secondary | ICD-10-CM

## 2015-05-24 DIAGNOSIS — M4726 Other spondylosis with radiculopathy, lumbar region: Secondary | ICD-10-CM | POA: Diagnosis not present

## 2015-05-24 DIAGNOSIS — M545 Low back pain, unspecified: Secondary | ICD-10-CM

## 2015-05-24 DIAGNOSIS — Z5181 Encounter for therapeutic drug level monitoring: Secondary | ICD-10-CM | POA: Insufficient documentation

## 2015-05-24 DIAGNOSIS — G8929 Other chronic pain: Secondary | ICD-10-CM | POA: Diagnosis not present

## 2015-05-24 DIAGNOSIS — M549 Dorsalgia, unspecified: Secondary | ICD-10-CM | POA: Diagnosis present

## 2015-05-24 DIAGNOSIS — G96198 Other disorders of meninges, not elsewhere classified: Secondary | ICD-10-CM | POA: Insufficient documentation

## 2015-05-24 DIAGNOSIS — Z969 Presence of functional implant, unspecified: Secondary | ICD-10-CM | POA: Insufficient documentation

## 2015-05-24 DIAGNOSIS — M791 Myalgia: Secondary | ICD-10-CM

## 2015-05-24 DIAGNOSIS — M5441 Lumbago with sciatica, right side: Secondary | ICD-10-CM

## 2015-05-24 DIAGNOSIS — M5382 Other specified dorsopathies, cervical region: Secondary | ICD-10-CM

## 2015-05-24 NOTE — Progress Notes (Signed)
Patient's Name: Melinda [name] MRN: [id number] DOB: 04/16/1956 DOS: 05/24/2015  Primary Reason(s) for Visit: Intrathecal Pump Refill. CC: Back Pain and Neck Pain   Pre-Procedure Assessment: Melinda [name] is a 59 y.o. year old, female patient, seen today for interventional treatment. She has Chronic pain syndrome; Neuropathy ([place]); CAD (coronary artery disease); Obesity (BMI 30-39.9); Anxiety and depression; Angina decubitus ([place]); Pure hypercholesterolemia; DM2 (diabetes mellitus, type 2) ([place]); Great toe amputation status ([place]); Personal history of spine surgery; Failed back surgical syndrome; Failed cervical surgery syndrome (ACDF); Chronic pain; Chronic neck pain; Chronic low back pain (Bilateral) (L>R); Lumbar spondylosis; Epidural fibrosis; Cervical spondylosis; Cervical facet syndrome; Chronic left lower extremity pain; Chronic lumbar radicular pain (Left); Iliotibial band syndrome affecting left lower leg; Diffuse myofascial pain syndrome; Musculoskeletal pain; Muscle spasms of both lower extremities; Muscle spasticity; Neurogenic pain; Neuropathic pain; Lumbar facet syndrome (Bilateral); Fibromyalgia; Long term current use of opiate analgesic; Long term prescription opiate use; Opiate use; Opioid dependence ([place]); Encounter for therapeutic drug level monitoring; Encounter for chronic pain management; Presence of functional implant (Medtronic programmable intrathecal pump) (Right abdominal area); Presence of intrathecal pump (Medtronic intrathecal programmable pump) (40 mL pump); Encounter for interrogation of infusion pump; and Encounter for adjustment or management of infusion pump on her problem list.. Her primarily concern today is the Back Pain and Neck Pain Verification of the correct person, correct site (including marking of site), and correct procedure were performed and confirmed by the patient. Today's Vitals   05/24/15 1457 05/24/15 1504  BP: 107/63   Pulse: 18   Temp: 98.5 F (36.9 C)    TempSrc: Oral   Resp: 18   Height: $Remove'5\' 9"'WRBKrAd$  (1.753 m)   Weight: 230 lb (104.327 kg)   SpO2: 98%   PainSc:  5   Calculated BMI: Body mass index is 33.95 kg/(m^2). Allergies: She has No Known Allergies.. Primary Diagnosis: Chronic pain syndrome [G89.4]  Today's Pain Score: 5 . Clinically she looks more like a 1-2/10. Reported level of pain is incompatible with clinical obrservations. This may be secondary to a possible lack of understanding on how the pain scale works. Pain Type: Chronic pain Pain Descriptors / Indicators: Aching Pain Frequency: Constant  Date of Last Visit:   04/11/2015 Service Provided on Last Visit:   Medication management  Pharmacotherapy Review:   Side-effects or Adverse reactions: None reported. Effectiveness: Described as relatively effective, allowing for increase in activities of daily living (ADL). Onset of action: Within expected pharmacological parameters. Duration of action: Within normal limits for medication. Peak effect: Timing and results are as within normal expected parameters. [place] PMP: Compliant with practice rules and regulations. DST: Compliant with practice rules and regulations. Lab work: No new labs ordered by our practice. Treatment compliance: Compliant. Substance Use Disorder (SUD) Risk Level: Low Planned course of action: Continue therapy as is.  Intrathecal Pump Therapy: Side-effects or Adverse reactions: None reported. Effectiveness: Described as relatively effective, allowing for increase in activities of daily living (ADL). Plan: Pump refill today.  Procedure: Type: Palliative Intrathecal Pump Refill by Nursing Staff Region: Lower Abdominal Area Laterality: Right-Sided  Indications: Chronic Pain  Consent: Secured. Under the influence of no sedatives a written informed consent was obtained, after having provided information on the risks and possible complications. To fulfill our ethical and legal obligations, as recommended  by the American Medical Association's Code of Ethics, we have provided information to the patient about our clinical impression; the nature and purpose of the treatment or procedure; the  risks, benefits, and possible complications of the intervention; alternatives; the risk(s) and benefit(s) of the alternative treatment(s) or procedure(s); and the risk(s) and benefit(s) of doing nothing. The patient was provided information about the risks and possible complications associated with the procedure. These include, but are not limited to, failure to achieve desired goals, infection, bleeding, organ or nerve damage, allergic reactions, paralysis, and death. In addition, the patient was informed that Medicine is not an exact science; therefore, there is also the possibility of unforeseen risks and possible complications that may result in a catastrophic outcome. The patient indicated having understood very clearly. We have given the patient no guarantees and we have made no promises. Enough time was given to the patient to ask questions, all of which were answered to the patient's satisfaction.  Pre-Procedure Preparation: Safety Precautions: Allergies reviewed. Appropriate site, procedure, and patient were confirmed by following the Joint Commission's Universal Protocol (UP.01.01.01), in the form of a "Time Out". The patient was asked about blood thinners, or active infections, both of which were denied. Monitoring:  Clinical observation Infection Control Precautions: Sterile technique used. Standard Universal Precautions were taken as recommended by the Department of [facility] for Disease Control and Prevention (CDC). Standard pre-surgical skin prep was conducted. Respiratory hygiene and cough etiquette was practiced. Hand hygiene observed. Safe injection practices and needle disposal techniques followed. Medications properly checked for expiration dates and contaminants. Personal protective equipment  (PPE) used: Sterile surgical gloves.  Anesthesia, Analgesia, Anxiolysis: Type: None required Local Anesthetic(s): None used. IV Access: None. Sedation: Not necessary or indicated. Indication(s): None.  Description of Procedure Process:  Time-out: "Time-out" completed before starting procedure, as per protocol. Position: Supine Target Area: For Intrathecal Pump Refill(s), the target is the central port. Approach: Anterior, 90 degree angle approach. Area Prepped: Entire Area around the pump implant. Prepping solution: ChloraPrep (2% chlorhexidine gluconate and 70% isopropyl alcohol) Safety Precautions: Aspiration looking for blood return was conducted prior to all injections. At no point did we inject any substances, as a needle was being advanced. No attempts were made at seeking any paresthesias. Safe injection practices and needle disposal techniques used. Medications properly checked for expiration dates. SDV (single dose vial) medications used. Description of the Procedure: Protocol guidelines were followed. Two nurses trained to do implant refills were present during the entire procedure. The refill medication was checked by both healthcare providers as well as the patient. The patient was included in the "Time-out" to verify the medication. The patient was placed in position. The pump was identified. The area was prepped in the usual manner. The sterile template was positioned over the pump, making sure the side-port location matched that of the pump. Both, the pump and the template were held for stability. The needle provided in the Medtronic Kit was then introduced thru the center of the template and into the central port. The pump content was aspirated and discarded volume documented. The new medication was slowly infused into the pump, thru the filter, making sure to avoid overpressure of the device. The needle was then removed and the area cleansed, making sure to leave some of the  prepping solution back to take advantage of its long term bactericidal properties. The pump was interrogated and programmed to reflect the correct medication, volume, and dosage. The program was printed and taken to the physician for approval. Once checked and signed by the physician, a copy was provided to the patient and another scanned into the EMR. EBL: None Materials Used:  Medtronic Refill Kit  Post-procedure Assessment:  Complications: Uneventful. Disposition: Return to clinic in 3 to4 months for refill. (See printout) Comments:  No additional relevant information.  Assessment: Encounter Diagnosis:  Primary Diagnosis: Chronic pain syndrome [G89.4] Plan: Melinda [name] was seen today for back pain and neck pain.  Diagnoses and all orders for this visit:  Chronic pain syndrome  Failed back surgical syndrome  Failed cervical surgery syndrome (ACDF)  Chronic pain  Chronic neck pain  Chronic low back pain  Osteoarthritis of spine with radiculopathy, lumbar region  Epidural fibrosis  Cervical spondylosis  Cervical facet syndrome  Chronic left lower extremity pain  Chronic lumbar radicular pain (Left)  Iliotibial band syndrome affecting left lower leg  Diffuse myofascial pain syndrome  Musculoskeletal pain  Muscle spasms of both lower extremities  Muscle spasticity  Neurogenic pain  Neuropathic pain  Lumbar facet syndrome (Bilateral)  Fibromyalgia  Long term current use of opiate analgesic  Long term prescription opiate use  Opiate use  Uncomplicated opioid dependence ([place])  Encounter for therapeutic drug level monitoring  Encounter for chronic pain management  Presence of functional implant (Medtronic programmable intrathecal pump) (Right abdominal area)  Presence of intrathecal pump (Medtronic intrathecal programmable pump) (40 mL pump)  Encounter for interrogation of infusion pump  Encounter for adjustment or management of infusion  pump   Patient instructions:  There are no Patient Instructions on file for this visit.  Primary Care Physician: [name], NP Location: [facility] Attending Physician: Gaspar Cola, MD

## 2015-05-24 NOTE — Progress Notes (Signed)
Safety precautions to be maintained throughout the outpatient stay will include: orient to surroundings, keep bed in low position, maintain call bell within reach at all times, provide assistance with transfer out of bed and ambulation.  

## 2015-05-25 ENCOUNTER — Telehealth: Payer: Self-pay

## 2015-05-25 NOTE — Telephone Encounter (Signed)
Post pump refill call back.  Patient states she is doing good.

## 2015-06-01 ENCOUNTER — Other Ambulatory Visit: Payer: Self-pay | Admitting: Pain Medicine

## 2015-06-03 MED FILL — Medication: Qty: 1 | Status: AC

## 2015-06-06 ENCOUNTER — Other Ambulatory Visit: Payer: Self-pay | Admitting: Cardiology

## 2015-06-06 DIAGNOSIS — I6523 Occlusion and stenosis of bilateral carotid arteries: Secondary | ICD-10-CM

## 2015-06-07 NOTE — Telephone Encounter (Signed)
Please call and assess to see what the issue is. Come back to me and report of findings.

## 2015-06-08 ENCOUNTER — Other Ambulatory Visit: Payer: Self-pay | Admitting: Cardiology

## 2015-06-24 ENCOUNTER — Other Ambulatory Visit: Payer: Self-pay | Admitting: Internal Medicine

## 2015-06-24 ENCOUNTER — Encounter: Payer: Medicare Other | Admitting: Internal Medicine

## 2015-06-28 ENCOUNTER — Other Ambulatory Visit: Payer: Self-pay | Admitting: Pain Medicine

## 2015-06-28 ENCOUNTER — Ambulatory Visit (HOSPITAL_COMMUNITY)
Admission: RE | Admit: 2015-06-28 | Discharge: 2015-06-28 | Disposition: A | Discharge status: Home / Self Care | Payer: Medicare Other | Source: Ambulatory Visit | Attending: Cardiovascular Disease | Admitting: Cardiovascular Disease

## 2015-06-28 DIAGNOSIS — I6523 Occlusion and stenosis of bilateral carotid arteries: Secondary | ICD-10-CM | POA: Diagnosis not present

## 2015-06-28 DIAGNOSIS — E785 Hyperlipidemia, unspecified: Secondary | ICD-10-CM | POA: Diagnosis not present

## 2015-07-01 ENCOUNTER — Encounter: Payer: Self-pay | Admitting: Internal Medicine

## 2015-07-01 ENCOUNTER — Ambulatory Visit (INDEPENDENT_AMBULATORY_CARE_PROVIDER_SITE_OTHER): Payer: Medicare Other | Admitting: Internal Medicine

## 2015-07-01 VITALS — BP 128/62 | HR 82 | Ht 69.5 in | Wt 232.6 lb

## 2015-07-01 DIAGNOSIS — I6523 Occlusion and stenosis of bilateral carotid arteries: Secondary | ICD-10-CM | POA: Diagnosis not present

## 2015-07-01 DIAGNOSIS — G4719 Other hypersomnia: Secondary | ICD-10-CM

## 2015-07-01 DIAGNOSIS — F329 Major depressive disorder, single episode, unspecified: Secondary | ICD-10-CM

## 2015-07-01 DIAGNOSIS — F32A Depression, unspecified: Secondary | ICD-10-CM

## 2015-07-01 DIAGNOSIS — G4733 Obstructive sleep apnea (adult) (pediatric): Secondary | ICD-10-CM

## 2015-07-01 MED ORDER — AMBULATORY NON FORMULARY MEDICATION: Status: DC

## 2015-07-01 NOTE — Progress Notes (Signed)
[facility] Consultation      Assessment and Plan:  Obstructive sleep apnea. -obstructive sleep apnea, appears to be adequately treated at this time, though she does have some issues with tolerance of her mask and the high pressures at this time. -We'll give her prescription to get a new CPAP supplies, either online and/or from her DME provider.  Excessive daytime sleepiness. -She continues to have residual daytime sleepiness. This is likely due to chronic opioid use, which is managed by the pain clinic. We will continue to monitor her daytime sleepiness.  Coronary artery disease. -Sleep apnea can contribute to cardiac disease, therefore his continue to monitor this.  Restless leg syndrome. -Appears to be adequately controlled at this time, likely due to use of CPAP as well as opioids, which can help reduce symptoms of restless leg syndrome.  Chronic pain. -She is on high-dose opioids, including a fentanyl pump, which may be contributing to her excessive sleepiness. -She has been on Provigil and Nuvigil in the past, which have subsequently been weaned off.  Anxiety/Depression.  -Depression can contribute to daytime sleepiness, she is also on Paxil, which can also contribute to daytime sleepiness. We'll continue to monitor.  Date: 07/01/2015  MRN# [id number] SARAHY CREEDON Mar 15, 1956  Referring Physician: Self-referral  COLA GANE is a 59 y.o. old female seen in consultation for chief complaint of:    Chief Complaint  Patient presents with  . SLEEP CONSULT    self referral. pt. states she wears CPAP everynight 4-8hr. everynight. sometimes feels pressure is too high. having some trouble with mask.  no spplies needed. NAT:FTDDU. EPWORTH score: 18    HPI:   Patient is a 59 year old female with a history, significant for coronary artery disease, previous smoking, anxiety, chronic pain, restless leg syndrome. She comes in today with transfer of her sleep care  from [facility]. Review of her old charting shows that she has a history of obstructive sleep apnea, which appears to be adequately controlled on CPAP levels which have been changed over the last few years between 10 and 15 and subsequently on an auto titrating setting from 9-20. She has been on Provigil in the past, which was then increased view Nuvigil subsequently weaned off of this.  She recently moved to this area and wanted to switch doctors. She feels that things have been going well with her CPAP, she has been having issues with the mask because she is having to tighten it up a lot and it is leaving marks on her face.  She occasionally feels that the pressure is too high.   She continues to have daytime sleepiness, and will fall asleep if she is idle at home. She goes to bed at variable times and gets between 4 to 12 hours of sleep per night.   She is currently on a fentanyl pump for chronic pain, she also takes lyrica, oxycodone, tinazidine daily for pain, she also takes flexeril occasionally. These are managed by her pain physician.   Review and summary of old records, and previous testing: -Sleep study from 05/20/2003: AHI of 11, consistent with mild obstructive sleep apnea. Titration study from 08/17/2003, patient required a CPAP of 9, she was recommended to be on AutoPap of 5-18, though 9 appeared to be adequate. On my review of the tracings, it would appear that the patient had a residual AHI of only 3 with a CPAP of 9, indicating a level of 9 would be adequate. -Auto titrate study  October 2014: My review of the tracings: CPAP with a low of 9 and a high of 20, showed very well-controlled apneas. Residual AHI of 1.7 with a median level of 10 -Review of the most recent charting shows that the patient as of 05/20/2014 was doing well on a ResMed S9 through [place], [place]. 95th percentile was 11 with 100% compliance. Her residual AHI was 1.7. -In the past for CPAP level is been  adjusted to between a level of 12 and 15. She is a history of smoking which she quit, history of restless leg syndrome-she has been on Nuvigil in the past    PMHX:   Past Medical History  Diagnosis Date  . Degenerative disc disease   . Spinal stenosis   . Facet joint disease   . Bulging disc   . MRSA (methicillin resistant staph aureus) culture positive 2011    GREAT TOE RIGHT FOOT  . Fibromyalgia   . Peripheral neuropathy ([place])   . MCL deficiency, knee   . Coronary arteritis   . CAD (coronary artery disease) 2009    s/p stent to LAD  . Hyperlipidemia   . Heart disease   . 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 Chicken pox   . Anemia     years ago  . Sleep apnea     uses CPAP, sleep study at [facility]   Surgical Hx:  Past Surgical History  Procedure Laterality Date  . Foot surgery Right     BIG TOE  . Hand surgery Right   . Gallbladder surgery    . Ablation      UTERUS  . Ablation      HEART  . Heart stent  2009    LAD  . Hand surgey Left   . Foot surgery Bilateral     PLANTAR FASCIITIS  . Foot surgery Right     2ND TOE  . Infusion pump implantation      X2 with morphine and baclofen  . Eye surgery Bilateral   . Back surgery      X 3 1979, 1994, 1995  . Fracture surgery Right 2012    carpal tunnel  . Cholecystectomy  2003  . Anterior cervical decomp/discectomy fusion N/A 07/28/2014    Procedure: ANTERIOR CERVICAL DECOMPRESSION/DISCECTOMY FUSION CERVICAL 3-4,4-5,5-6 LEVELS WITH INSTRUMENTATION AND ALLOGRAFT;  Surgeon: Sinclair Ship, MD;  Location: [facility];  Service: Orthopedics;  Laterality: N/A;  Anterior cervical decompression fusion, cervical 3-4, cervical 4-5, cervical 5-6 with instrumentation and allograft   Family Hx:  Family History  Problem Relation Age of Onset  . Cancer Mother     lung  . Diabetes Father   . Heart disease Maternal Grandfather   . Diabetes Paternal Grandmother   . Cancer Paternal Grandfather      colon  . Stroke Neg Hx    Social Hx:   Social History  Substance Use Topics  . Smoking status: Former Smoker -- 1.50 packs/day for 32 years    Types: Cigarettes    Quit date: 07/22/2007  . Smokeless tobacco: Never Used  . Alcohol Use: No   Medication:   Current Outpatient Rx  Name  Route  Sig  Dispense  Refill  . ACCU-CHEK SOFTCLIX LANCETS lancets   Other   1 each by Other route 2 (two) times daily. Use as instructed   100 each   2     Dx code E11.9   . aspirin 81 MG  tablet   Oral   Take 81 mg by mouth daily.         Marland Kitchen BACLOFEN PO   Intrathecal   88.7 mcg by Intrathecal route daily. Continuous infusion with morphine pump.         . Blood Glucose Calibration (ACCU-CHEK AVIVA) SOLN   In Vitro   1 each by In Vitro route as needed. Patient taking differently: 1 each by Other route See admin instructions. Check blood sugar once daily.   1 each   1     250.00   . Blood Glucose Monitoring Suppl (ACCU-CHEK AVIVA PLUS) W/DEVICE KIT   Does not apply   1 Device by Does not apply route once. Patient taking differently: 1 each by Other route See admin instructions. Check blood sugar once daily.   1 kit   0     250.00   . buPROPion (WELLBUTRIN XL) 150 MG 24 hr tablet      TAKE 1 TABLET BY MOUTH DAILY   90 tablet   3   . CRESTOR 20 MG tablet      TAKE 1 TABLET BY MOUTH DAILY   30 tablet   11     Dispense as written.   . cyclobenzaprine (FLEXERIL) 5 MG tablet   Oral   Take 5 mg by mouth 3 (three) times daily.          . fentaNYL 5,000 mcg   Intrathecal   by Intrathecal route daily. 147.95 mcg/day         . glucose blood (ACCU-CHEK AVIVA PLUS) test strip   Other   1-2 each by Other route 2 (two) times daily as needed. Patient taking differently: 1 each by Other route See admin instructions. Check blood sugar once daily.   100 each   5     E11.9   . isosorbide dinitrate (ISORDIL) 30 MG tablet      TAKE 1 TABLET BY MOUTH DAILY   30 tablet   8    . nitroGLYCERIN (NITROSTAT) 0.4 MG SL tablet   Sublingual   Place 1 tablet (0.4 mg total) under the tongue every 5 (five) minutes as needed for chest pain.   25 tablet   2   . oxyCODONE (OXY IR/ROXICODONE) 5 MG immediate release tablet   Oral   Take 1 tablet (5 mg total) by mouth every 8 (eight) hours as needed for severe pain.   90 tablet   0     Do not place this medication, or any other prescri ...   . oxyCODONE (OXY IR/ROXICODONE) 5 MG immediate release tablet   Oral   Take 1 tablet (5 mg total) by mouth every 8 (eight) hours as needed for severe pain.   90 tablet   0     Do not place this medication, or any other prescri ...   . oxyCODONE (OXY IR/ROXICODONE) 5 MG immediate release tablet   Oral   Take 1 tablet (5 mg total) by mouth every 8 (eight) hours as needed for severe pain (1 to 3 times daily as needed for pain.).   90 tablet   0     Do not place this medication, or any other prescri ...   . PAIN MANAGEMENT IT PUMP REFILL   Intrathecal   1 each by Intrathecal route once. Medication: PF Fentanyl 500.0 mcg/ml PF Baclofen 300.34mg/ml PF Bupivicaine 20.0 mg/ml Total Volume: 40 ml Needed by 05-24-15 @ 1440   1 each  0   . PARoxetine (PAXIL) 20 MG tablet      TAKE ONE-HALF (1/2) OF A TABLET BY MOUTHTWICE DAILY   90 tablet   0   . polyethylene glycol (MIRALAX / GLYCOLAX) packet   Oral   Take 17 g by mouth daily.         . pregabalin (LYRICA) 100 MG capsule   Oral   Take 1 capsule (100 mg total) by mouth 3 (three) times daily.   90 capsule   2     Do not place this medication, or any other prescri ...   . tiZANidine (ZANAFLEX) 4 MG tablet   Oral   Take 1 tablet (4 mg total) by mouth every 6 (six) hours as needed.   90 tablet   2     Do not place this medication, or any other prescri ...   . UNABLE TO FIND   Pump Prime   5.918 mg by Pump Prime route daily. Bupivicaine             Allergies:  Review of patient's allergies indicates no  known allergies.  Review of Systems: Gen:  Denies  fever, sweats, chills HEENT: Denies blurred vision, double vision. bleeds, sore throat Cvc:  No dizziness, chest pain. Resp:   Denies cough or sputum porduction, shortness of breath Gi: Denies swallowing difficulty, stomach pain. Gu:  Denies bladder incontinence, burning urine Ext:   No Joint pain, stiffness. Skin: No skin rash,  hives Endoc:  No polyuria, polydipsia. Psych: No depression, insomnia. Other:  All other systems were reviewed with the patient and were negative other that what is mentioned in the HPI.   Physical Examination:   VS: BP 128/62 mmHg  Pulse 82  Ht 5' 9.5" (1.765 m)  Wt 232 lb 9.6 oz (105.507 kg)  BMI 33.87 kg/m2  SpO2 94%  General Appearance: No distress  Neuro:without focal findings,  speech normal,  HEENT: PERRLA, EOM intact.  Mallampati 3 Pulmonary: normal breath sounds, No wheezing.  CardiovascularNormal S1,S2.  No m/r/g.   Abdomen: Benign, Soft, non-tender. Right middle quadrant subcutaneous pump in place. Renal:  No costovertebral tenderness  GU:  No performed at this time. Endoc: No evident thyromegaly, no signs of acromegaly. Skin:   warm, no rashes, no ecchymosis  Extremities: normal, no cyanosis, clubbing.  Other findings:    LABORATORY PANEL:   CBC No results for input(s): WBC, HGB, HCT, PLT in the last 168 hours. ------------------------------------------------------------------------------------------------------------------  Chemistries  No results for input(s): NA, K, CL, CO2, GLUCOSE, BUN, CREATININE, CALCIUM, MG, AST, ALT, ALKPHOS, BILITOT in the last 168 hours.  Invalid input(s): GFRCGP ------------------------------------------------------------------------------------------------------------------  Cardiac Enzymes No results for input(s): TROPONINI in the last 168 hours. ------------------------------------------------------------  RADIOLOGY:  No results  found.     Thank  you for the consultation and for allowing [facility] to assist in the care of your patient. Our recommendations are noted above.  Please contact us if we can be of further service.   Marda Stalker, MD.  Board Certified in Internal Medicine, Pulmonary Medicine, [place], and Sleep Medicine.  [facility].    Patricia Pesa, M.D.  Vilinda Boehringer, M.D.  Merton Border, M.D

## 2015-07-01 NOTE — Patient Instructions (Signed)
--  Will need to obtain a download on most recent auto-titration results.   --Continue using CPAP every night.   --Will give script for new supplies, may purchase supplies online, but check with [employer] to make sure that they have that mask available.

## 2015-07-01 NOTE — Addendum Note (Signed)
Addended by: Oscar La R on: 07/01/2015 12:11 PM   Modules accepted: Orders

## 2015-07-01 NOTE — Addendum Note (Signed)
Addended by: Oscar La R on: 07/01/2015 02:43 PM   Modules accepted: Orders

## 2015-07-04 ENCOUNTER — Encounter: Payer: Self-pay | Admitting: Internal Medicine

## 2015-07-05 ENCOUNTER — Telehealth: Payer: Self-pay

## 2015-07-05 NOTE — Telephone Encounter (Signed)
Spoke with patient and she states there is not a problem with her medications.

## 2015-07-08 ENCOUNTER — Other Ambulatory Visit: Payer: Self-pay | Admitting: Pain Medicine

## 2015-07-12 ENCOUNTER — Other Ambulatory Visit (HOSPITAL_BASED_OUTPATIENT_CLINIC_OR_DEPARTMENT_OTHER): Payer: Medicare Other

## 2015-07-12 ENCOUNTER — Telehealth: Payer: Self-pay | Admitting: Pain Medicine

## 2015-07-12 ENCOUNTER — Other Ambulatory Visit: Payer: Self-pay | Admitting: *Deleted

## 2015-07-12 MED ORDER — PREGABALIN 100 MG PO CAPS: 100.0000 mg | ORAL_CAPSULE | Freq: Three times a day (TID) | ORAL | Status: DC

## 2015-07-12 NOTE — Telephone Encounter (Signed)
Spoke with Dr. Dossie Arbour, ok to call in enough Lyrica TLU 07-27-15. Patient notified.

## 2015-07-12 NOTE — Telephone Encounter (Signed)
Did not get refill enough on Lyrica could she pick up one script to last until her appt on 07-27-15 for meds [phone]

## 2015-07-18 ENCOUNTER — Ambulatory Visit (HOSPITAL_BASED_OUTPATIENT_CLINIC_OR_DEPARTMENT_OTHER): Payer: Medicare Other | Attending: Internal Medicine | Admitting: Radiology

## 2015-07-18 DIAGNOSIS — G4733 Obstructive sleep apnea (adult) (pediatric): Secondary | ICD-10-CM

## 2015-07-18 DIAGNOSIS — G4719 Other hypersomnia: Secondary | ICD-10-CM

## 2015-07-18 DIAGNOSIS — Z9989 Dependence on other enabling machines and devices: Principal | ICD-10-CM

## 2015-07-19 ENCOUNTER — Telehealth: Payer: Self-pay | Admitting: Cardiology

## 2015-07-19 NOTE — Telephone Encounter (Signed)
New message      Pt c/o medication issue:  1. Name of Medication: crestor  2. How are you currently taking this medication (dosage and times per day)? 20mg  3. Are you having a reaction (difficulty breathing--STAT)? no 4. What is your medication issue?  Ins will not pay for this medication.  Pt got a letter stating they will no longer pay for crestor.  Please call

## 2015-07-22 DIAGNOSIS — M1711 Unilateral primary osteoarthritis, right knee: Secondary | ICD-10-CM | POA: Diagnosis not present

## 2015-07-25 DIAGNOSIS — M1711 Unilateral primary osteoarthritis, right knee: Secondary | ICD-10-CM | POA: Diagnosis not present

## 2015-07-27 ENCOUNTER — Other Ambulatory Visit: Payer: Self-pay | Admitting: Pain Medicine

## 2015-07-27 ENCOUNTER — Ambulatory Visit: Payer: Medicare Other | Attending: Pain Medicine | Admitting: Pain Medicine

## 2015-07-27 ENCOUNTER — Encounter: Payer: Self-pay | Admitting: Pain Medicine

## 2015-07-27 VITALS — BP 124/75 | HR 82 | Temp 98.7°F | Resp 16 | Ht 70.0 in | Wt 230.0 lb

## 2015-07-27 DIAGNOSIS — E785 Hyperlipidemia, unspecified: Secondary | ICD-10-CM | POA: Diagnosis not present

## 2015-07-27 DIAGNOSIS — Z9049 Acquired absence of other specified parts of digestive tract: Secondary | ICD-10-CM | POA: Diagnosis not present

## 2015-07-27 DIAGNOSIS — Z5181 Encounter for therapeutic drug level monitoring: Secondary | ICD-10-CM

## 2015-07-27 DIAGNOSIS — M797 Fibromyalgia: Secondary | ICD-10-CM | POA: Diagnosis not present

## 2015-07-27 DIAGNOSIS — M1712 Unilateral primary osteoarthritis, left knee: Secondary | ICD-10-CM | POA: Diagnosis not present

## 2015-07-27 DIAGNOSIS — G8929 Other chronic pain: Secondary | ICD-10-CM | POA: Insufficient documentation

## 2015-07-27 DIAGNOSIS — Z87891 Personal history of nicotine dependence: Secondary | ICD-10-CM | POA: Diagnosis not present

## 2015-07-27 DIAGNOSIS — M542 Cervicalgia: Secondary | ICD-10-CM

## 2015-07-27 DIAGNOSIS — M5382 Other specified dorsopathies, cervical region: Secondary | ICD-10-CM

## 2015-07-27 DIAGNOSIS — E669 Obesity, unspecified: Secondary | ICD-10-CM | POA: Diagnosis not present

## 2015-07-27 DIAGNOSIS — Z79891 Long term (current) use of opiate analgesic: Secondary | ICD-10-CM

## 2015-07-27 DIAGNOSIS — F1190 Opioid use, unspecified, uncomplicated: Secondary | ICD-10-CM | POA: Diagnosis not present

## 2015-07-27 DIAGNOSIS — M25569 Pain in unspecified knee: Secondary | ICD-10-CM | POA: Diagnosis present

## 2015-07-27 DIAGNOSIS — I2510 Atherosclerotic heart disease of native coronary artery without angina pectoris: Secondary | ICD-10-CM | POA: Insufficient documentation

## 2015-07-27 DIAGNOSIS — M25562 Pain in left knee: Secondary | ICD-10-CM | POA: Diagnosis not present

## 2015-07-27 DIAGNOSIS — M47812 Spondylosis without myelopathy or radiculopathy, cervical region: Secondary | ICD-10-CM

## 2015-07-27 DIAGNOSIS — G894 Chronic pain syndrome: Secondary | ICD-10-CM

## 2015-07-27 DIAGNOSIS — F329 Major depressive disorder, single episode, unspecified: Secondary | ICD-10-CM | POA: Insufficient documentation

## 2015-07-27 DIAGNOSIS — M25561 Pain in right knee: Secondary | ICD-10-CM

## 2015-07-27 DIAGNOSIS — T402X5A Adverse effect of other opioids, initial encounter: Secondary | ICD-10-CM | POA: Insufficient documentation

## 2015-07-27 DIAGNOSIS — K5903 Drug induced constipation: Secondary | ICD-10-CM | POA: Diagnosis not present

## 2015-07-27 DIAGNOSIS — G629 Polyneuropathy, unspecified: Secondary | ICD-10-CM

## 2015-07-27 MED ORDER — OXYCODONE HCL 5 MG PO TABS: 5.0000 mg | ORAL_TABLET | Freq: Three times a day (TID) | ORAL | Status: DC | PRN

## 2015-07-27 MED ORDER — BENEFIBER PO POWD: ORAL | Status: DC

## 2015-07-27 MED ORDER — PREGABALIN 100 MG PO CAPS: 100.0000 mg | ORAL_CAPSULE | Freq: Three times a day (TID) | ORAL | Status: DC

## 2015-07-27 MED ORDER — PREGABALIN 100 MG PO CAPS
100.0000 mg | ORAL_CAPSULE | Freq: Three times a day (TID) | ORAL | Status: DC
Start: 2015-07-27 — End: 2015-07-27

## 2015-07-27 MED ORDER — DOCUSATE SODIUM 100 MG PO CAPS: 200.0000 mg | ORAL_CAPSULE | Freq: Every evening | ORAL | Status: DC | PRN

## 2015-07-27 MED ORDER — TIZANIDINE HCL 4 MG PO TABS: 4.0000 mg | ORAL_TABLET | Freq: Four times a day (QID) | ORAL | Status: DC | PRN

## 2015-07-27 MED ORDER — BISACODYL 5 MG PO TBEC: 10.0000 mg | DELAYED_RELEASE_TABLET | Freq: Every evening | ORAL | Status: DC | PRN

## 2015-07-27 NOTE — Progress Notes (Signed)
Safety precautions to be maintained throughout the outpatient stay will include: orient to surroundings, keep bed in low position, maintain call bell within reach at all times, provide assistance with transfer out of bed and ambulation. Oxycodone pill count # 25

## 2015-07-27 NOTE — Progress Notes (Signed)
Patient's Name: Melinda [name] MRN: [id number] DOB: 05-22-1956 DOS: 07/27/2015  Primary Reason(s) for Visit: Encounter for Medication Management CC: Knee Pain; Neck Pain; Shoulder Pain; and Hip Pain   HPI  Melinda [name] is a 60 y.o. year old, female patient, who returns today as an established patient. She has Chronic pain syndrome; CAD (coronary artery disease); Obesity (BMI 30-39.9); Anxiety and depression; Angina decubitus ([place]); Pure hypercholesterolemia; DM2 (diabetes mellitus, type 2) ([place]); Great toe amputation status ([place]); Personal history of spine surgery; Failed back surgical syndrome; Failed cervical surgery syndrome (ACDF); Chronic pain; Chronic neck pain; Chronic low back pain (Bilateral) (L>R); Lumbar spondylosis; Epidural fibrosis; Cervical spondylosis; Cervical facet syndrome; Chronic lower extremity pain (Left); Chronic lumbar radicular pain (Left); Iliotibial band syndrome (Left); Diffuse myofascial pain syndrome; Musculoskeletal pain; Muscle spasms of lower extremities (Bilateral); Muscle spasticity; Neurogenic pain; Neuropathic pain; Lumbar facet syndrome (Bilateral); Fibromyalgia; Long term current use of opiate analgesic; Long term prescription opiate use; Opiate use; Opioid dependence ([place]); Encounter for therapeutic drug level monitoring; Encounter for chronic pain management; Presence of functional implant (Medtronic programmable intrathecal pump) (Right abdominal area); Presence of intrathecal pump (Medtronic intrathecal programmable pump) (40 mL pump); Encounter for interrogation of infusion pump; Encounter for adjustment or management of infusion pump; Therapeutic opioid-induced constipation (OIC); Chronic knee pain (Left); and Osteoarthritis of knee (Left) on her problem list.. Her primarily concern today is the Knee Pain; Neck Pain; Shoulder Pain; and Hip Pain   The patient comes into clinic today for pharmacological management of her chronic pain. Her low back pain on the lower  extremity pain is being managed by her intrathecal pump. She also has some upper extremity pain which we have to manage with oral medications. She has been experiencing some pain and problems in the right knee and she seen an orthopedic surgeon for that. She recently had her knee scoped and had some steroids injected into it. She was doing rather well until she twisted it and she went back this past Monday to them to be evaluated. According to her they had to withdraw some fluid from the knee. She says that it is feeling much better now.  She is doing well with her oral medications and today we will refill those. She will also be followed later on for her intrathecal pump refill which was done on 05/24/2015. Unfortunately, her father recently died and she confessed that she was depressed and smoked some marijuana with a friend. She has informed us of this and since we do not have a long-standing history of problems with this, and she was honest with [place], we have simply given her a warning and remind her that should this happen again, we will be forced to discharge her from the practice. She understood and accepted and indicated that she will not do that again.  Reported Pain Score: 5  Reported level is inconsistent with clinical obrservations. Pain Type: Chronic pain Pain Location: Neck (shoulder, right knee) Pain Descriptors / Indicators: Throbbing, Aching Pain Frequency: Constant  Date of Last Visit: 05/24/15 Service Provided on Last Visit:  (IT pump refill)  Pharmacotherapy  Medication(s): Oxycodone IR 5 mg every 8 hours when necessary for upper extremity and neck pain. Onset of action: Within expected pharmacological parameters Time to Peak effect: Timing and results are as within normal expected parameters Analgesic Effect: More than 50% Activity Facilitation: Medication(s) allow patient to sit, stand, walk, and do the basic ADLs Perceived Effectiveness: Described as relatively effective,  allowing for increase  in activities of daily living (ADL) Side-effects or Adverse reactions: None reported Duration of action: Within normal limits for medication [place] PMP: Compliant with practice rules and regulations UDS Results: The patient's last UDS done on 04/06/2014 came back within normal limits with no unexpected substances. UDS Interpretation: Patient appears to be compliant with practice rules and regulations Medication Assessment Form: Reviewed. Patient indicates being compliant with therapy Treatment compliance: Compliant Substance Use Disorder (SUD) Risk Level: Low Pharmacologic Plan: Continue therapy as is  Lab Work: Illicit Drugs No results found for: THCU, COCAINSCRNUR, PCPSCRNUR, MDMA, AMPHETMU, METHADONE, ETOH  Inflammation Markers Lab Results  Component Value Date   ESRSEDRATE 16 09/18/2010   CRP 0.2 09/18/2010    Renal Function Lab Results  Component Value Date   BUN 18 07/21/2014   CREATININE 0.79 07/21/2014   GFRAA >90 07/21/2014   GFRNONAA >90 07/21/2014    Hepatic Function Lab Results  Component Value Date   AST 23 07/21/2014   ALT 26 07/21/2014   ALBUMIN 3.8 07/21/2014    Electrolytes Lab Results  Component Value Date   NA 140 07/21/2014   K 4.2 07/21/2014   CL 101 07/21/2014   CALCIUM 9.1 07/21/2014   MG 2.1 06/30/2013    Intrathecal Pump Therapy: Side-effects or Adverse reactions: None reported Effectiveness: Described as relatively effective, allowing for increase in activities of daily living (ADL) Plan: No changes in programming  Allergies  Melinda [name] has No Known Allergies.  Meds  The patient has a current medication list which includes the following prescription(s): accu-chek softclix lancets, AMBULATORY NON FORMULARY MEDICATION, aspirin, baclofen, accu-chek aviva, accu-chek aviva plus, bupropion, crestor, fentaNYL 5,000 mcg, glucose blood, isosorbide dinitrate, meloxicam, nitroglycerin, oxycodone, PAIN MANAGEMENT IT PUMP  REFILL, paroxetine, polyethylene glycol, pregabalin, tizanidine, bisacodyl, docusate sodium, oxycodone, oxycodone, and benefiber.  Current Outpatient Prescriptions on File Prior to Visit  Medication Sig  . ACCU-CHEK SOFTCLIX LANCETS lancets 1 each by Other route 2 (two) times daily. Use as instructed  . AMBULATORY NON FORMULARY MEDICATION Medication Name: CPAP MASK OF CHOICE FOR HOME DEVICE  . aspirin 81 MG tablet Take 81 mg by mouth daily.  Marland Kitchen BACLOFEN PO 88.7 mcg by Intrathecal route daily. Continuous infusion with morphine pump.  . Blood Glucose Calibration (ACCU-CHEK AVIVA) SOLN 1 each by In Vitro route as needed. (Patient taking differently: 1 each by Other route See admin instructions. Check blood sugar once daily.)  . Blood Glucose Monitoring Suppl (ACCU-CHEK AVIVA PLUS) W/DEVICE KIT 1 Device by Does not apply route once. (Patient taking differently: 1 each by Other route See admin instructions. Check blood sugar once daily.)  . buPROPion (WELLBUTRIN XL) 150 MG 24 hr tablet TAKE 1 TABLET BY MOUTH DAILY  . CRESTOR 20 MG tablet TAKE 1 TABLET BY MOUTH DAILY  . fentaNYL 5,000 mcg by Intrathecal route daily. 147.95 mcg/day  . glucose blood (ACCU-CHEK AVIVA PLUS) test strip 1-2 each by Other route 2 (two) times daily as needed. (Patient taking differently: 1 each by Other route See admin instructions. Check blood sugar once daily.)  . isosorbide dinitrate (ISORDIL) 30 MG tablet TAKE 1 TABLET BY MOUTH DAILY  . nitroGLYCERIN (NITROSTAT) 0.4 MG SL tablet Place 1 tablet (0.4 mg total) under the tongue every 5 (five) minutes as needed for chest pain.  Marland Kitchen PAIN MANAGEMENT IT PUMP REFILL 1 each by Intrathecal route once. Medication: PF Fentanyl 500.0 mcg/ml PF Baclofen 300.19mg/ml PF Bupivicaine 20.0 mg/ml Total Volume: 40 ml Needed by 05-24-15 @ 1440  .  PARoxetine (PAXIL) 20 MG tablet TAKE ONE-HALF (1/2) OF A TABLET BY MOUTHTWICE DAILY  . polyethylene glycol (MIRALAX / GLYCOLAX) packet Take 17 g by  mouth daily.   No current facility-administered medications on file prior to visit.    ROS  Constitutional: Afebrile, no chills, well hydrated and well nourished Gastrointestinal: negative Musculoskeletal:negative Neurological: negative Behavioral/Psych: negative  PFSH  Medical:  Melinda [name]  has a past medical history of Degenerative disc disease; Spinal stenosis; Facet joint disease; Bulging disc; MRSA (methicillin resistant staph aureus) culture positive (2011); Fibromyalgia; Peripheral neuropathy ([place]); MCL deficiency, knee; Coronary arteritis; CAD (coronary artery disease) (2009); Hyperlipidemia; Heart disease; Cardiac arrhythmia due to congenital heart disease; Chicken pox; Anemia; Sleep apnea; and Neuropathy ([place]) (04/18/2010). Family: family history includes Cancer in her mother and paternal grandfather; Diabetes in her father and paternal grandmother; Heart disease in her maternal grandfather. There is no history of Stroke. Surgical:  has past surgical history that includes Foot surgery (Right); Hand surgery (Right); Gallbladder surgery; Ablation; Ablation; HEART STENT (2009); HAND SURGEY (Left); Foot surgery (Bilateral); Foot surgery (Right); Infusion pump implantation; Eye surgery (Bilateral); Back surgery; Fracture surgery (Right, 2012); Cholecystectomy (2003); and Anterior cervical decomp/discectomy fusion (N/A, 07/28/2014). Tobacco:  reports that she quit smoking about 8 years ago. Her smoking use included Cigarettes. She has a 48 pack-year smoking history. She has never used smokeless tobacco. Alcohol:  reports that she drinks alcohol. Drug:  reports that she does not use illicit drugs.  Physical Exam  Vitals:  Today's Vitals   07/27/15 1146 07/27/15 1147  BP: 124/75   Pulse: 82   Temp: 98.7 F (37.1 C)   Resp: 16   Height: _0  (1.778 m)   Weight: 230 lb (104.327 kg)   SpO2: 96%   PainSc: 5  5   PainLoc: Shoulder     Calculated BMI: Body mass index is 33  kg/(m^2).  General appearance: alert, cooperative, appears stated age, no distress and moderately obese Eyes: PERLA Respiratory: No evidence respiratory distress, no audible rales or ronchi and no use of accessory muscles of respiration  Cervical Spine Inspection: Normal anatomy, except for scars from prior surgeries. Alignment: Symetrical ROM: Decreased  Upper Extremities Inspection: No gross anomalies detected ROM: Adequate Sensory: Normal Motor: Unremarkable Pulses: Palpable  Thoracic Spine Inspection: No gross anomalies detected Alignment: Symetrical ROM: Adequate Palpation: WNL  Lumbar Spine Inspection: No gross anomalies detected Alignment: Symetrical ROM: Decreased Palpation: WNL Gait: WNL  Lower Extremities Inspection: No gross anomalies detected ROM: Decreased on the right knee. Sensory: Normal Motor: Unremarkable  Toe walk (S1): WNL  Heal walk (L5): WNL   Assessment & Plan  Primary Diagnosis & Pertinent Problem List: The primary encounter diagnosis was Chronic pain. Diagnoses of Cervical facet syndrome, Chronic neck pain, Fibromyalgia, Encounter for therapeutic drug level monitoring, Long term current use of opiate analgesic, Therapeutic opioid-induced constipation (OIC), Chronic pain syndrome, Neuropathy (HCC), Chronic knee pain (Left), and Primary osteoarthritis of left knee were also pertinent to this visit.  Visit Diagnosis: 1. Chronic pain   2. Cervical facet syndrome   3. Chronic neck pain   4. Fibromyalgia   5. Encounter for therapeutic drug level monitoring   6. Long term current use of opiate analgesic   7. Therapeutic opioid-induced constipation (OIC)   8. Chronic pain syndrome   9. Neuropathy ([place])   10. Chronic knee pain (Left)   11. Primary osteoarthritis of left knee     Assessment: No problem-specific assessment & plan notes  found for this encounter.   Plan of Care  Pharmacotherapy (Medications Ordered): Meds ordered this  encounter  Medications  . tiZANidine (ZANAFLEX) 4 MG tablet    Sig: Take 1 tablet (4 mg total) by mouth every 6 (six) hours as needed.    Dispense:  120 tablet    Refill:  2    Do not place this medication, or any other prescription from our practice, on "Automatic Refill". Patient may have prescription filled one day early if pharmacy is closed on scheduled refill date.  Marland Kitchen DISCONTD: pregabalin (LYRICA) 100 MG capsule    Sig: Take 1 capsule (100 mg total) by mouth 3 (three) times daily.    Dispense:  90 capsule    Refill:  2    Do not place this medication, or any other prescription from our practice, on "Automatic Refill". Patient may have prescription filled one day early if pharmacy is closed on scheduled refill date.  Marland Kitchen oxyCODONE (OXY IR/ROXICODONE) 5 MG immediate release tablet    Sig: Take 1 tablet (5 mg total) by mouth every 8 (eight) hours as needed for moderate pain or severe pain.    Dispense:  90 tablet    Refill:  0    Do not place this medication, or any other prescription from our practice, on "Automatic Refill". Patient may have prescription filled one day early if pharmacy is closed on scheduled refill date. Do not fill until: 07/28/15 To last until: 08/27/15  . oxyCODONE (OXY IR/ROXICODONE) 5 MG immediate release tablet    Sig: Take 1 tablet (5 mg total) by mouth every 8 (eight) hours as needed for moderate pain or severe pain.    Dispense:  90 tablet    Refill:  0    Do not place this medication, or any other prescription from our practice, on "Automatic Refill". Patient may have prescription filled one day early if pharmacy is closed on scheduled refill date. Do not fill until: 08/27/15 To last until: 09/23/15  . oxyCODONE (OXY IR/ROXICODONE) 5 MG immediate release tablet    Sig: Take 1 tablet (5 mg total) by mouth every 8 (eight) hours as needed for moderate pain or severe pain.    Dispense:  90 tablet    Refill:  0    Do not place this medication, or any other  prescription from our practice, on "Automatic Refill". Patient may have prescription filled one day early if pharmacy is closed on scheduled refill date. Do not fill until: 09/23/15 To last until: 10/23/15  . pregabalin (LYRICA) 100 MG capsule    Sig: Take 1 capsule (100 mg total) by mouth 3 (three) times daily.    Dispense:  90 capsule    Refill:  2    Do not place this medication, or any other prescription from our practice, on "Automatic Refill". Patient may have prescription filled one day early if pharmacy is closed on scheduled refill date.  . Wheat Dextrin (BENEFIBER) POWD    Sig: Stir 2 tsp. TID into 4-8 oz of any non-carbonated beverage or soft food (hot or cold)    Dispense:  500 g    Refill:  PRN    Do not place this medication, or any other prescription from our practice, on "Automatic Refill". Patient may have prescription filled one day early if pharmacy is closed on scheduled refill date.  . docusate sodium (COLACE) 100 MG capsule    Sig: Take 2 capsules (200 mg total) by mouth at bedtime  as needed for moderate constipation. Do not use longer than 7 days.    Dispense:  60 capsule    Refill:  PRN    Do not place this medication, or any other prescription from our practice, on "Automatic Refill". Patient may have prescription filled one day early if pharmacy is closed on scheduled refill date.  . bisacodyl (DULCOLAX) 5 MG EC tablet    Sig: Take 2 tablets (10 mg total) by mouth at bedtime as needed for moderate constipation ((Hold for loose stool)).    Dispense:  100 tablet    Refill:  PRN    Do not place this medication, or any other prescription from our practice, on "Automatic Refill". Patient may have prescription filled one day early if pharmacy is closed on scheduled refill date.    Lab-work & Procedure Ordered: Orders Placed This Encounter  Procedures  . Drugs of abuse screen w/o alc, rtn urine-sln    Volume: 10 ml(s). Minimum 3 ml of urine is needed. Document  temperature of fresh sample. Indications: Long term (current) use of opiate analgesic (Z79.891) Test#: [id number] (Tox[employer] Select-13)    Imaging Ordered: None  Interventional Therapies: Scheduled: Intrathecal pump refill. PRN Procedures: Right knee Hyalgan injections versus genicular nerve block under fluoroscopic guidance, no sedation.    Referral(s) or Consult(s): None at this point. The patient is being seen by an orthopedic surgeon for her right knee.  Medications administered during this visit: Melinda [name] had no medications administered during this visit.  Future Appointments Date Time Provider [place]  07/28/2015 2:30 PM Regina W Baity, NP [facility]  09/20/2015 2:00 PM [name], MD [facility] None  10/17/2015 2:40 PM [name], MD [facility] None    Primary Care Physician: [name], NP Location: [facility] Out[facility] Facility Note by: Kathlen Brunswick. Dossie Arbour, M.D, DABA, DABAPM, DABPM, DABIPP, FIPP

## 2015-07-28 ENCOUNTER — Encounter: Payer: Self-pay | Admitting: Internal Medicine

## 2015-07-28 ENCOUNTER — Ambulatory Visit (INDEPENDENT_AMBULATORY_CARE_PROVIDER_SITE_OTHER): Payer: Medicare Other | Admitting: Internal Medicine

## 2015-07-28 VITALS — BP 122/74 | HR 71 | Temp 98.3°F | Ht 69.0 in | Wt 224.0 lb

## 2015-07-28 DIAGNOSIS — E1165 Type 2 diabetes mellitus with hyperglycemia: Secondary | ICD-10-CM | POA: Diagnosis not present

## 2015-07-28 DIAGNOSIS — I2510 Atherosclerotic heart disease of native coronary artery without angina pectoris: Secondary | ICD-10-CM | POA: Diagnosis not present

## 2015-07-28 DIAGNOSIS — Z0000 Encounter for general adult medical examination without abnormal findings: Secondary | ICD-10-CM

## 2015-07-28 DIAGNOSIS — I10 Essential (primary) hypertension: Secondary | ICD-10-CM

## 2015-07-28 DIAGNOSIS — F329 Major depressive disorder, single episode, unspecified: Secondary | ICD-10-CM

## 2015-07-28 DIAGNOSIS — F419 Anxiety disorder, unspecified: Secondary | ICD-10-CM

## 2015-07-28 DIAGNOSIS — M797 Fibromyalgia: Secondary | ICD-10-CM | POA: Diagnosis not present

## 2015-07-28 DIAGNOSIS — F418 Other specified anxiety disorders: Secondary | ICD-10-CM

## 2015-07-28 DIAGNOSIS — Z23 Encounter for immunization: Secondary | ICD-10-CM

## 2015-07-28 DIAGNOSIS — F32A Depression, unspecified: Secondary | ICD-10-CM

## 2015-07-28 DIAGNOSIS — E785 Hyperlipidemia, unspecified: Secondary | ICD-10-CM | POA: Diagnosis not present

## 2015-07-28 LAB — CBC
HCT: 41.9 % (ref 36.0–46.0)
Hemoglobin: 13.9 g/dL (ref 12.0–15.0)
MCHC: 33.1 g/dL (ref 30.0–36.0)
MCV: 85.6 fl (ref 78.0–100.0)
Platelets: 292 10*3/uL (ref 150.0–400.0)
RBC: 4.9 Mil/uL (ref 3.87–5.11)
RDW: 13.1 % (ref 11.5–15.5)
WBC: 8.5 10*3/uL (ref 4.0–10.5)

## 2015-07-28 LAB — HEMOGLOBIN A1C: Hgb A1c MFr Bld: 6.3 % (ref 4.6–6.5)

## 2015-07-28 LAB — COMPREHENSIVE METABOLIC PANEL
ALT: 25 U/L (ref 0–35)
AST: 19 U/L (ref 0–37)
Albumin: 4.5 g/dL (ref 3.5–5.2)
Alkaline Phosphatase: 102 U/L (ref 39–117)
BUN: 34 mg/dL — ABNORMAL HIGH (ref 6–23)
CO2: 33 mEq/L — ABNORMAL HIGH (ref 19–32)
Calcium: 9.3 mg/dL (ref 8.4–10.5)
Chloride: 102 mEq/L (ref 96–112)
Creatinine, Ser: 0.92 mg/dL (ref 0.40–1.20)
GFR: 66.67 mL/min (ref >60.00)
Glucose, Bld: 92 mg/dL (ref 70–99)
Potassium: 4.4 mEq/L (ref 3.5–5.1)
Sodium: 140 mEq/L (ref 135–145)
Total Bilirubin: 0.4 mg/dL (ref 0.2–1.2)
Total Protein: 7.4 g/dL (ref 6.0–8.3)

## 2015-07-28 LAB — LIPID PANEL
Cholesterol: 111 mg/dL (ref 0–200)
HDL: 37.4 mg/dL — ABNORMAL LOW (ref >39.00)
LDL Cholesterol: 40 mg/dL (ref 0–99)
NonHDL: 73.77
Total CHOL/HDL Ratio: 3
Triglycerides: 170 mg/dL — ABNORMAL HIGH (ref 0.0–149.0)
VLDL: 34 mg/dL (ref 0.0–40.0)

## 2015-07-28 LAB — TSH: TSH: 0.41 u[IU]/mL (ref 0.35–4.50)

## 2015-07-28 NOTE — Progress Notes (Signed)
Pre visit review using our clinic review tool, if applicable. No additional management support is needed unless otherwise documented below in the visit note. 

## 2015-07-28 NOTE — Progress Notes (Signed)
HPI:  Pt presents to the clinic today for her Medicare Wellness Exam. She is also due for follow up of chronic conditions.  Chronic back pain: She follows with Dr. Dossie Arbour. She does have an implanted pain pump, that she gets Fentanyl, Baclofen and Bupivicaine.  Fibromyalgia: She takes Lyrica, Zanaflex and Oxycodone as directed by Dr. Dossie Arbour. She is always in pain but she is able to function with the current treatment she is on.  HLD with CAD: Her last LDL was 41. She takes Crestor. She denies chest pain or chest tightness. She has Nitroglycerin to take but does not have to take it. She tries to consume a low fat diet but reports she could be better at it.   Anxiety and Depression: Chronic but stable on Paxil and Wellbutrin. She denies SI/HI.  Diabetes Mellitus Type 2:  Her last A1C was 6.3%. She reports her sugars have ranged 170 and up, fasting. They are around 200, 2 hours after eating. She has not had any hypoglycemia. She tries to eat a low carb diet, but does reports that she could do a better job. She does feel like her A1C is going to be up. She has had a eye exam in the last year. Pneumovax never. Flu 04/2014.  Past Medical History  Diagnosis Date  . Degenerative disc disease   . Spinal stenosis   . Facet joint disease   . Bulging disc   . MRSA (methicillin resistant staph aureus) culture positive 2011    GREAT TOE RIGHT FOOT  . Fibromyalgia   . Peripheral neuropathy ([place])   . MCL deficiency, knee   . Coronary arteritis   . CAD (coronary artery disease) 2009    s/p stent to LAD  . Hyperlipidemia   . Heart disease   . 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 Chicken pox   . Anemia     years ago  . Sleep apnea     uses CPAP, sleep study at [facility]  . Neuropathy [facility]) 04/18/2010    Current Outpatient Prescriptions  Medication Sig Dispense Refill  . ACCU-CHEK SOFTCLIX LANCETS lancets 1 each by Other route 2 (two) times daily. Use as instructed 100  each 2  . AMBULATORY NON FORMULARY MEDICATION Medication Name: CPAP MASK OF CHOICE FOR HOME DEVICE 1 each 0  . aspirin 81 MG tablet Take 81 mg by mouth daily.    . bisacodyl (DULCOLAX) 5 MG EC tablet Take 2 tablets (10 mg total) by mouth at bedtime as needed for moderate constipation ((Hold for loose stool)). 100 tablet PRN  . Blood Glucose Calibration (ACCU-CHEK AVIVA) SOLN 1 each by In Vitro route as needed. (Patient taking differently: 1 each by Other route See admin instructions. Check blood sugar once daily.) 1 each 1  . Blood Glucose Monitoring Suppl (ACCU-CHEK AVIVA PLUS) W/DEVICE KIT 1 Device by Does not apply route once. (Patient taking differently: 1 each by Other route See admin instructions. Check blood sugar once daily.) 1 kit 0  . buPROPion (WELLBUTRIN XL) 150 MG 24 hr tablet TAKE 1 TABLET BY MOUTH DAILY 90 tablet 3  . CRESTOR 20 MG tablet TAKE 1 TABLET BY MOUTH DAILY 30 tablet 11  . docusate sodium (COLACE) 100 MG capsule Take 2 capsules (200 mg total) by mouth at bedtime as needed for moderate constipation. Do not use longer than 7 days. 60 capsule PRN  . fentaNYL 5,000 mcg by Intrathecal route daily. 147.95 mcg/day    .  glucose blood (ACCU-CHEK AVIVA PLUS) test strip 1-2 each by Other route 2 (two) times daily as needed. (Patient taking differently: 1 each by Other route See admin instructions. Check blood sugar once daily.) 100 each 5  . isosorbide dinitrate (ISORDIL) 30 MG tablet TAKE 1 TABLET BY MOUTH DAILY 30 tablet 8  . meloxicam (MOBIC) 15 MG tablet Take 15 mg by mouth daily.    . nitroGLYCERIN (NITROSTAT) 0.4 MG SL tablet Place 1 tablet (0.4 mg total) under the tongue every 5 (five) minutes as needed for chest pain. 25 tablet 2  . oxyCODONE (OXY IR/ROXICODONE) 5 MG immediate release tablet Take 1 tablet (5 mg total) by mouth every 8 (eight) hours as needed for moderate pain or severe pain. 90 tablet 0  . oxyCODONE (OXY IR/ROXICODONE) 5 MG immediate release tablet Take 1 tablet  (5 mg total) by mouth every 8 (eight) hours as needed for moderate pain or severe pain. 90 tablet 0  . oxyCODONE (OXY IR/ROXICODONE) 5 MG immediate release tablet Take 1 tablet (5 mg total) by mouth every 8 (eight) hours as needed for moderate pain or severe pain. 90 tablet 0  . PAIN MANAGEMENT IT PUMP REFILL 1 each by Intrathecal route once. Medication: PF Fentanyl 500.0 mcg/ml PF Baclofen 300.45mcg/ml PF Bupivicaine 20.0 mg/ml Total Volume: 40 ml Needed by 05-24-15 @ 1440 1 each 0  . PARoxetine (PAXIL) 20 MG tablet TAKE ONE-HALF (1/2) OF A TABLET BY MOUTHTWICE DAILY 90 tablet 0  . polyethylene glycol (MIRALAX / GLYCOLAX) packet Take 17 g by mouth daily.    . pregabalin (LYRICA) 100 MG capsule Take 1 capsule (100 mg total) by mouth 3 (three) times daily. 90 capsule 2  . tiZANidine (ZANAFLEX) 4 MG tablet Take 1 tablet (4 mg total) by mouth every 6 (six) hours as needed. 120 tablet 2  . Wheat Dextrin (BENEFIBER) POWD Stir 2 tsp. TID into 4-8 oz of any non-carbonated beverage or soft food (hot or cold) 500 g PRN   No current facility-administered medications for this visit.    No Known Allergies  Family History  Problem Relation Age of Onset  . Cancer Mother     lung  . Diabetes Father   . Heart disease Maternal Grandfather   . Diabetes Paternal Grandmother   . Cancer Paternal Grandfather     colon  . Stroke Neg Hx     Social History   Social History  . Marital Status: Single    Spouse Name: N/A  . Number of Children: N/A  . Years of Education: N/A   Occupational History  . Not on file.   Social History Main Topics  . Smoking status: Former Smoker -- 1.50 packs/day for 32 years    Types: Cigarettes    Quit date: 07/22/2007  . Smokeless tobacco: Never Used  . Alcohol Use: Yes  . Drug Use: No  . Sexual Activity: Yes   Other Topics Concern  . Not on file   Social History Narrative    Hospitiliaztions: None  Health Maintenance:    Flu: 05/2014   Tetanus:  unsure  Pneumovax: never  Mammogram: 03/2015  Pap Smear: 2010  Colon Screening: 2011  Eye Doctor: 03/2015  Dental Exam: as needed   Providers:   PCP: Nicki Reaper, NP-C  Pain Management: Dr. Laban Emperor  Cardiologist: Dr. Anne Fu  Orthopedic: Dr. Fara Chute  Pulmonologist: Dr. Nicholos Johns   I have personally reviewed and have noted:  1. The patient's medical and social history 2.  Their use of alcohol, tobacco or illicit drugs 3. Their current medications and supplements 4. The patient's functional ability including ADL's, fall risks, home safety risks and hearing or  visual impairment. 5. Diet and physical activities 6. Evidence for depression or mood disorder  Subjective:   Review of Systems:   Constitutional: Pt reports fatigue. Denies fever, malaise, headache or abrupt weight changes.  HEENT: Denies eye pain, eye redness, ear pain, ringing in the ears, wax buildup, runny nose, nasal congestion, bloody nose, or sore throat. Respiratory: Denies difficulty breathing, shortness of breath, cough or sputum production.   Cardiovascular: Denies chest pain, chest tightness, palpitations or swelling in the hands or feet.  Gastrointestinal: Denies abdominal pain, bloating, constipation, diarrhea or blood in the stool.  GU: Denies urgency, frequency, pain with urination, burning sensation, blood in urine, odor or discharge. Musculoskeletal: Pt reports back and muscle pain. Denies difficulty with gait, or joint pain and swelling.  Skin: Denies redness, rashes, lesions or ulcercations.  Neurological: Pt reports difficulty with memory. Denies dizziness, difficulty with speech or problems with balance and coordination.  Psych: Pt reports history of anxiety and depression. Denies SI/HI.  No other specific complaints in a complete review of systems (except as listed in HPI above).  Objective:  PE:  BP 122/74 mmHg  Pulse 71  Temp(Src) 98.3 F (36.8 C) (Oral)  Ht '5\' 9"'$  (1.753 m)  Wt 224 lb  (101.606 kg)  BMI 33.06 kg/m2  SpO2 97%  Wt Readings from Last 3 Encounters:  07/28/15 224 lb (101.606 kg)  07/27/15 230 lb (104.327 kg)  07/01/15 232 lb 9.6 oz (105.507 kg)    General: Appears her stated age, obese in NAD. Skin: Warm, dry and intact. No rashes, lesions or ulcerations noted. Cardiovascular: Normal rate and rhythm. S1,S2 noted.  No murmur, rubs or gallops noted. No JVD or BLE edema. No carotid bruits noted. Pulmonary/Chest: Normal effort and positive vesicular breath sounds. No respiratory distress. No wheezes, rales or ronchi noted.  Abdomen: Soft and nontender. Normal bowel sounds. No distention or masses noted. Liver, spleen and kidneys non palpable. Musculoskeletal: Decreased flexion of the spine. Normal extension and rotation. Strength 5/5 BUE/BLE. No difficulty with gait.  Neurological: Alert and oriented. Sensation intact to BLE.  Psychiatric: Mood and affect flat. Behavior is normal. Judgment and thought content normal.   BMET    Component Value Date/Time   NA 140 07/21/2014 1340   NA 135* 06/30/2013 1203   K 4.2 07/21/2014 1340   K 4.1 06/30/2013 1203   CL 101 07/21/2014 1340   CL 102 06/30/2013 1203   CO2 30 07/21/2014 1340   CO2 32 06/30/2013 1203   GLUCOSE 103* 07/21/2014 1340   GLUCOSE 143* 06/30/2013 1203   BUN 18 07/21/2014 1340   BUN 19* 06/30/2013 1203   CREATININE 0.79 07/21/2014 1340   CREATININE 0.89 06/30/2013 1203   CALCIUM 9.1 07/21/2014 1340   CALCIUM 9.7 06/30/2013 1203   GFRNONAA >90 07/21/2014 1340   GFRNONAA >60 06/30/2013 1203   GFRAA >90 07/21/2014 1340   GFRAA >60 06/30/2013 1203    Lipid Panel     Component Value Date/Time   CHOL 93 04/27/2014 1036   TRIG 113.0 04/27/2014 1036   HDL 29.00* 04/27/2014 1036   CHOLHDL 3 04/27/2014 1036   VLDL 22.6 04/27/2014 1036   LDLCALC 41 04/27/2014 1036    CBC    Component Value Date/Time   WBC 7.1 07/21/2014 1340   WBC 10.6 11/12/2011 1136  RBC 4.41 07/21/2014 1340   RBC  3.47* 10/05/2013 0805   RBC 4.75 11/12/2011 1136   HGB 12.2 07/21/2014 1340   HGB 14.0 11/12/2011 1136   HCT 37.8 07/21/2014 1340   HCT 40.7 11/12/2011 1136   PLT 234 07/21/2014 1340   PLT 274 11/12/2011 1136   MCV 85.7 07/21/2014 1340   MCV 86 11/12/2011 1136   MCH 27.7 07/21/2014 1340   MCH 29.5 11/12/2011 1136   MCHC 32.3 07/21/2014 1340   MCHC 34.5 11/12/2011 1136   RDW 13.0 07/21/2014 1340   RDW 12.8 11/12/2011 1136   LYMPHSABS 1.7 07/21/2014 1340   LYMPHSABS 2.5 11/12/2011 1136   MONOABS 0.7 07/21/2014 1340   MONOABS 0.8 11/12/2011 1136   EOSABS 0.3 07/21/2014 1340   EOSABS 0.6 11/12/2011 1136   BASOSABS 0.0 07/21/2014 1340   BASOSABS 0.1 11/12/2011 1136    Hgb A1C Lab Results  Component Value Date   HGBA1C 6.2 04/27/2014      Assessment and Plan:   Medicare Annual Wellness Visit:  Diet: She eats whatever she wants. She does consumes meat. She eats fruits and veggies a few days per week. She does eat some fried foods. She drinks mostly water and Coke Zero. Physical activity: Sedentary Depression/mood screen: Positive, chronic, on meds Hearing: Intact to whispered voice Visual acuity: Grossly normal, performs annual eye exam  ADLs: Capable Fall risk: None Home safety: Good Cognitive evaluation: Intact to orientation, naming, recall and repetition EOL planning: No Adv directives, full code/ I agree  Preventative Medicine:  Flu and Pneumovax UTD Will discuss Tetanus at next visit Mammogram UTD Colonoscopy UTD She declines pap smear today Encouraged her to see an eye doctor and dentist at least annually Encouraged her to consume a balanced diet and start an exercise regimen   Next appointment:  6 months follow up chronic conditions    Chronic back pain:  Continue to follow with Dr. Consuela Mimes  HLD with CAD:   Will check Lipid profile and CMET today Encouraged her to continue a low fat diet continue Isosorbide and Crestor  DM 2:  Flu and  Pneumovax today Foot exam today Encouraged low fat, low carb diet Encouraged yearly eye exams Will check A1C and microalbumin today  Fibromyalgia:  Continue Zanaflex and Lyrica Continue to follow with Dr. Consuela Mimes  Anxiety and Depression:  Support offered today Continue Paxil and Wellbutrin

## 2015-07-29 ENCOUNTER — Other Ambulatory Visit: Payer: Self-pay

## 2015-07-29 MED ORDER — ROSUVASTATIN CALCIUM 20 MG PO TABS: 20.0000 mg | ORAL_TABLET | Freq: Every day | ORAL | Status: DC

## 2015-07-29 NOTE — Telephone Encounter (Signed)
Spoke with patient today, who states her Insurance will not cover Crestor/ She is willing to try generic. New rx for Rosuvastatin 20mg  sent to [facility]Mar[facility] Kitchen

## 2015-08-01 NOTE — Patient Instructions (Signed)
Fat and Cholesterol Restricted Diet  High levels of fat and cholesterol in your blood may lead to various health problems, such as diseases of the heart, blood vessels, gallbladder, liver, and pancreas. Fats are concentrated sources of energy that come in various forms. Certain types of fat, including saturated fat, may be harmful in excess. Cholesterol is a substance needed by your body in small amounts. Your body makes all the cholesterol it needs. Excess cholesterol comes from the food you eat.  When you have high levels of cholesterol and saturated fat in your blood, health problems can develop because the excess fat and cholesterol will gather along the walls of your blood vessels, causing them to narrow. Choosing the right foods will help you control your intake of fat and cholesterol. This will help keep the levels of these substances in your blood within normal limits and reduce your risk of disease.  WHAT IS MY PLAN?  Your health care provider recommends that you:  · Get no more than __________ % of the total calories in your daily diet from fat.  · Limit your intake of saturated fat to less than ______% of your total calories each day.  · Limit the amount of cholesterol in your diet to less than _________mg per day.  WHAT TYPES OF FAT SHOULD I CHOOSE?  · Choose healthy fats more often. Choose monounsaturated and polyunsaturated fats, such as olive and canola oil, flaxseeds, walnuts, almonds, and seeds.  · Eat more omega-3 fats. Good choices include salmon, mackerel, sardines, tuna, flaxseed oil, and ground flaxseeds. Aim to eat fish at least two times a week.  · Limit saturated fats. Saturated fats are primarily found in animal products, such as meats, butter, and cream. Plant sources of saturated fats include palm oil, palm kernel oil, and coconut oil.  · Avoid foods with partially hydrogenated oils in them. These contain trans fats. Examples of foods that contain trans fats are stick margarine, some tub  margarines, cookies, crackers, and other baked goods.  WHAT GENERAL GUIDELINES DO I NEED TO FOLLOW?  These guidelines for healthy eating will help you control your intake of fat and cholesterol:  · Check food labels carefully to identify foods with trans fats or high amounts of saturated fat.  · Fill one half of your plate with vegetables and green salads.  · Fill one fourth of your plate with whole grains. Look for the word "whole" as the first word in the ingredient list.  · Fill one fourth of your plate with lean protein foods.  · Limit fruit to two servings a day. Choose fruit instead of juice.  · Eat more foods that contain soluble fiber. Examples of foods that contain this type of fiber are apples, broccoli, carrots, beans, peas, and barley. Aim to get 20-30 g of fiber per day.  · Eat more home-cooked food and less restaurant, buffet, and fast food.  · Limit or avoid alcohol.  · Limit foods high in starch and sugar.  · Limit fried foods.  · Cook foods using methods other than frying. Baking, boiling, grilling, and broiling are all great options.  · Lose weight if you are overweight. Losing just 5-10% of your initial body weight can help your overall health and prevent diseases such as diabetes and heart disease.  WHAT FOODS CAN I EAT?  Grains  Whole grains, such as whole wheat or whole grain breads, crackers, cereals, and pasta. Unsweetened oatmeal, bulgur, barley, quinoa, or brown rice. Corn   or whole wheat flour tortillas.  Vegetables  Fresh or frozen vegetables (raw, steamed, roasted, or grilled). Green salads.  Fruits  All fresh, canned (in natural juice), or frozen fruits.  Meat and Other Protein Products  Ground beef (85% or leaner), grass-fed beef, or beef trimmed of fat. Skinless chicken or turkey. Ground chicken or turkey. Pork trimmed of fat. All fish and seafood. Eggs. Dried beans, peas, or lentils. Unsalted nuts or seeds. Unsalted canned or dry beans.  Dairy  Low-fat dairy products, such as skim or  1% milk, 2% or reduced-fat cheeses, low-fat ricotta or cottage cheese, or plain low-fat yogurt.  Fats and Oils  Tub margarines without trans fats. Light or reduced-fat mayonnaise and salad dressings. Avocado. Olive, canola, sesame, or safflower oils. Natural peanut or almond butter (choose ones without added sugar and oil).  The items listed above may not be a complete list of recommended foods or beverages. Contact your dietitian for more options.  WHAT FOODS ARE NOT RECOMMENDED?  Grains  White bread. White pasta. White rice. Cornbread. Bagels, pastries, and croissants. Crackers that contain trans fat.  Vegetables  White potatoes. Corn. Creamed or fried vegetables. Vegetables in a cheese sauce.  Fruits  Dried fruits. Canned fruit in light or heavy syrup. Fruit juice.  Meat and Other Protein Products  Fatty cuts of meat. Ribs, chicken wings, bacon, sausage, bologna, salami, chitterlings, fatback, hot dogs, bratwurst, and packaged luncheon meats. Liver and organ meats.  Dairy  Whole or 2% milk, cream, half-and-half, and cream cheese. Whole milk cheeses. Whole-fat or sweetened yogurt. Full-fat cheeses. Nondairy creamers and whipped toppings. Processed cheese, cheese spreads, or cheese curds.  Sweets and Desserts  Corn syrup, sugars, honey, and molasses. Candy. Jam and jelly. Syrup. Sweetened cereals. Cookies, pies, cakes, donuts, muffins, and ice cream.  Fats and Oils  Butter, stick margarine, lard, shortening, ghee, or bacon fat. Coconut, palm kernel, or palm oils.  Beverages  Alcohol. Sweetened drinks (such as sodas, lemonade, and fruit drinks or punches).  The items listed above may not be a complete list of foods and beverages to avoid. Contact your dietitian for more information.     This information is not intended to replace advice given to you by your health care provider. Make sure you discuss any questions you have with your health care provider.     Document Released: 06/18/2005 Document Revised: 07/09/2014  Document Reviewed: 09/16/2013  Elsevier Interactive Patient Education ©2016 [employer]

## 2015-08-02 ENCOUNTER — Other Ambulatory Visit: Payer: Self-pay | Admitting: Internal Medicine

## 2015-08-02 NOTE — Addendum Note (Signed)
Addended by: Lurlean Nanny on: 08/02/2015 09:16 AM   Modules accepted: Orders

## 2015-08-04 LAB — TOXASSURE SELECT 13 (MW), URINE: PDF: 0

## 2015-08-08 DIAGNOSIS — M25561 Pain in right knee: Secondary | ICD-10-CM | POA: Diagnosis not present

## 2015-08-10 ENCOUNTER — Other Ambulatory Visit: Payer: Self-pay | Admitting: Orthopaedic Surgery

## 2015-08-10 DIAGNOSIS — M25561 Pain in right knee: Secondary | ICD-10-CM

## 2015-08-13 NOTE — Progress Notes (Signed)
Quick Note:  The findings of this UDT were reported as abnormal due to inconsistencies with expected results. An unreported substance was identified in the sample. Expectations were based on the medication history provided by the patient at the time of sample collection. These results are of concern due to the following possibilities: 1. The use of multiple providers, suggesting the illegal practice of "Doctor Shopping", in violation of our medication agreement. 2. The use of unsanctioned and possibly illegal substances.  Unreported Hydrocodone. Fentanyl comes from the patient's intrathecal pump. ______

## 2015-08-17 ENCOUNTER — Telehealth: Payer: Self-pay | Admitting: *Deleted

## 2015-08-17 DIAGNOSIS — G4733 Obstructive sleep apnea (adult) (pediatric): Secondary | ICD-10-CM

## 2015-08-17 NOTE — Telephone Encounter (Signed)
Patient called needs a rx for mask and tubing to [facility] # [phone] [facility] office.

## 2015-08-17 NOTE — Telephone Encounter (Signed)
Per DR ok to place order to [facility] for mask and supplies.

## 2015-08-25 ENCOUNTER — Other Ambulatory Visit: Payer: Self-pay

## 2015-08-25 MED ORDER — PAIN MANAGEMENT IT PUMP REFILL
1.0000 | Freq: Once | INTRATHECAL | Status: DC
Start: 2015-09-20 — End: 2016-01-09

## 2015-08-29 ENCOUNTER — Ambulatory Visit
Admission: RE | Admit: 2015-08-29 | Discharge: 2015-08-29 | Disposition: A | Discharge status: Home / Self Care | Payer: Medicare Other | Source: Ambulatory Visit | Attending: Orthopaedic Surgery | Admitting: Orthopaedic Surgery

## 2015-08-29 DIAGNOSIS — S83241A Other tear of medial meniscus, current injury, right knee, initial encounter: Secondary | ICD-10-CM | POA: Diagnosis not present

## 2015-08-29 DIAGNOSIS — S83281A Other tear of lateral meniscus, current injury, right knee, initial encounter: Secondary | ICD-10-CM | POA: Insufficient documentation

## 2015-08-29 DIAGNOSIS — X58XXXA Exposure to other specified factors, initial encounter: Secondary | ICD-10-CM | POA: Diagnosis not present

## 2015-08-29 DIAGNOSIS — M1711 Unilateral primary osteoarthritis, right knee: Secondary | ICD-10-CM | POA: Diagnosis not present

## 2015-08-29 DIAGNOSIS — M25561 Pain in right knee: Secondary | ICD-10-CM

## 2015-08-29 DIAGNOSIS — S83206A Unspecified tear of unspecified meniscus, current injury, right knee, initial encounter: Secondary | ICD-10-CM | POA: Diagnosis present

## 2015-09-01 ENCOUNTER — Telehealth: Payer: Self-pay | Admitting: Pain Medicine

## 2015-09-01 NOTE — Telephone Encounter (Signed)
Patient called and states she has looked at her drug screen and saw the Hydrocodone and would like to explain. She stated that while her dad was sick her knee was really hurting her and she took a few of his Hydrocodone after he passed away.  She wanted to let [name] know.

## 2015-09-01 NOTE — Telephone Encounter (Signed)
Would like to speak with [facility] regarding lab test

## 2015-09-02 DIAGNOSIS — M25561 Pain in right knee: Secondary | ICD-10-CM | POA: Diagnosis not present

## 2015-09-10 ENCOUNTER — Encounter (HOSPITAL_COMMUNITY): Payer: Self-pay

## 2015-09-10 ENCOUNTER — Emergency Department (HOSPITAL_COMMUNITY): Payer: Medicare Other

## 2015-09-10 DIAGNOSIS — Z8249 Family history of ischemic heart disease and other diseases of the circulatory system: Secondary | ICD-10-CM

## 2015-09-10 DIAGNOSIS — F329 Major depressive disorder, single episode, unspecified: Secondary | ICD-10-CM | POA: Diagnosis not present

## 2015-09-10 DIAGNOSIS — I2510 Atherosclerotic heart disease of native coronary artery without angina pectoris: Secondary | ICD-10-CM | POA: Diagnosis not present

## 2015-09-10 DIAGNOSIS — G4489 Other headache syndrome: Principal | ICD-10-CM | POA: Diagnosis present

## 2015-09-10 DIAGNOSIS — Z79899 Other long term (current) drug therapy: Secondary | ICD-10-CM

## 2015-09-10 DIAGNOSIS — R51 Headache: Secondary | ICD-10-CM | POA: Diagnosis not present

## 2015-09-10 DIAGNOSIS — R1031 Right lower quadrant pain: Secondary | ICD-10-CM | POA: Diagnosis not present

## 2015-09-10 DIAGNOSIS — E785 Hyperlipidemia, unspecified: Secondary | ICD-10-CM | POA: Diagnosis present

## 2015-09-10 DIAGNOSIS — E7800 Pure hypercholesterolemia, unspecified: Secondary | ICD-10-CM | POA: Diagnosis present

## 2015-09-10 DIAGNOSIS — G894 Chronic pain syndrome: Secondary | ICD-10-CM | POA: Diagnosis present

## 2015-09-10 DIAGNOSIS — I2589 Other forms of chronic ischemic heart disease: Secondary | ICD-10-CM | POA: Diagnosis not present

## 2015-09-10 DIAGNOSIS — Z79891 Long term (current) use of opiate analgesic: Secondary | ICD-10-CM

## 2015-09-10 DIAGNOSIS — F419 Anxiety disorder, unspecified: Secondary | ICD-10-CM | POA: Diagnosis present

## 2015-09-10 DIAGNOSIS — Z955 Presence of coronary angioplasty implant and graft: Secondary | ICD-10-CM

## 2015-09-10 DIAGNOSIS — Z87891 Personal history of nicotine dependence: Secondary | ICD-10-CM

## 2015-09-10 DIAGNOSIS — G629 Polyneuropathy, unspecified: Secondary | ICD-10-CM | POA: Diagnosis not present

## 2015-09-10 DIAGNOSIS — R0789 Other chest pain: Secondary | ICD-10-CM | POA: Diagnosis not present

## 2015-09-10 DIAGNOSIS — Z981 Arthrodesis status: Secondary | ICD-10-CM

## 2015-09-10 DIAGNOSIS — M797 Fibromyalgia: Secondary | ICD-10-CM | POA: Diagnosis not present

## 2015-09-10 DIAGNOSIS — Z7982 Long term (current) use of aspirin: Secondary | ICD-10-CM

## 2015-09-10 DIAGNOSIS — G4733 Obstructive sleep apnea (adult) (pediatric): Secondary | ICD-10-CM | POA: Diagnosis present

## 2015-09-10 DIAGNOSIS — Z9689 Presence of other specified functional implants: Secondary | ICD-10-CM | POA: Diagnosis present

## 2015-09-10 DIAGNOSIS — M545 Low back pain: Secondary | ICD-10-CM | POA: Diagnosis present

## 2015-09-10 LAB — I-STAT TROPONIN, ED: Troponin i, poc: 0.01 ng/mL (ref 0.00–0.08)

## 2015-09-10 LAB — CBC
HCT: 38.9 % (ref 36.0–46.0)
Hemoglobin: 12.7 g/dL (ref 12.0–15.0)
MCH: 28.3 pg (ref 26.0–34.0)
MCHC: 32.6 g/dL (ref 30.0–36.0)
MCV: 86.6 fL (ref 78.0–100.0)
Platelets: 206 10*3/uL (ref 150–400)
RBC: 4.49 MIL/uL (ref 3.87–5.11)
RDW: 12.8 % (ref 11.5–15.5)
WBC: 9.6 10*3/uL (ref 4.0–10.5)

## 2015-09-10 LAB — BASIC METABOLIC PANEL
Anion gap: 11 (ref 5–15)
BUN: 25 mg/dL — ABNORMAL HIGH (ref 6–20)
CO2: 27 mmol/L (ref 22–32)
Calcium: 9.1 mg/dL (ref 8.9–10.3)
Chloride: 102 mmol/L (ref 101–111)
Creatinine, Ser: 1 mg/dL (ref 0.44–1.00)
GFR calc Af Amer: >60 mL/min (ref >60)
GFR calc non Af Amer: >60 mL/min (ref >60)
Glucose, Bld: 143 mg/dL — ABNORMAL HIGH (ref 65–99)
Potassium: 4.6 mmol/L (ref 3.5–5.1)
Sodium: 140 mmol/L (ref 135–145)
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
## 2015-09-11 ENCOUNTER — Inpatient Hospital Stay (HOSPITAL_COMMUNITY)
Admission: EM | Admit: 2015-09-11 | Discharge: 2015-09-13 | DRG: 103 | Disposition: A | Discharge status: Home / Self Care | Payer: Medicare Other | Attending: Internal Medicine | Admitting: Internal Medicine

## 2015-09-11 DIAGNOSIS — E785 Hyperlipidemia, unspecified: Secondary | ICD-10-CM | POA: Diagnosis present

## 2015-09-11 DIAGNOSIS — R519 Headache, unspecified: Secondary | ICD-10-CM

## 2015-09-11 DIAGNOSIS — G8929 Other chronic pain: Secondary | ICD-10-CM | POA: Diagnosis present

## 2015-09-11 DIAGNOSIS — R51 Headache: Secondary | ICD-10-CM | POA: Diagnosis not present

## 2015-09-11 DIAGNOSIS — G894 Chronic pain syndrome: Secondary | ICD-10-CM | POA: Diagnosis present

## 2015-09-11 DIAGNOSIS — M545 Low back pain: Secondary | ICD-10-CM | POA: Diagnosis not present

## 2015-09-11 DIAGNOSIS — G4489 Other headache syndrome: Secondary | ICD-10-CM | POA: Diagnosis not present

## 2015-09-11 DIAGNOSIS — M797 Fibromyalgia: Secondary | ICD-10-CM | POA: Diagnosis not present

## 2015-09-11 DIAGNOSIS — I2510 Atherosclerotic heart disease of native coronary artery without angina pectoris: Secondary | ICD-10-CM | POA: Diagnosis present

## 2015-09-11 DIAGNOSIS — Z7982 Long term (current) use of aspirin: Secondary | ICD-10-CM | POA: Diagnosis not present

## 2015-09-11 DIAGNOSIS — Z9689 Presence of other specified functional implants: Secondary | ICD-10-CM | POA: Diagnosis present

## 2015-09-11 DIAGNOSIS — Z981 Arthrodesis status: Secondary | ICD-10-CM | POA: Diagnosis not present

## 2015-09-11 DIAGNOSIS — Z955 Presence of coronary angioplasty implant and graft: Secondary | ICD-10-CM | POA: Diagnosis not present

## 2015-09-11 DIAGNOSIS — F419 Anxiety disorder, unspecified: Secondary | ICD-10-CM | POA: Diagnosis present

## 2015-09-11 DIAGNOSIS — F32A Depression, unspecified: Secondary | ICD-10-CM | POA: Diagnosis present

## 2015-09-11 DIAGNOSIS — G4485 Primary stabbing headache: Secondary | ICD-10-CM

## 2015-09-11 DIAGNOSIS — R079 Chest pain, unspecified: Secondary | ICD-10-CM | POA: Diagnosis present

## 2015-09-11 DIAGNOSIS — E1169 Type 2 diabetes mellitus with other specified complication: Secondary | ICD-10-CM | POA: Diagnosis present

## 2015-09-11 DIAGNOSIS — I2589 Other forms of chronic ischemic heart disease: Secondary | ICD-10-CM | POA: Diagnosis present

## 2015-09-11 DIAGNOSIS — Z79891 Long term (current) use of opiate analgesic: Secondary | ICD-10-CM | POA: Diagnosis not present

## 2015-09-11 DIAGNOSIS — E7800 Pure hypercholesterolemia, unspecified: Secondary | ICD-10-CM | POA: Diagnosis not present

## 2015-09-11 DIAGNOSIS — G4733 Obstructive sleep apnea (adult) (pediatric): Secondary | ICD-10-CM | POA: Diagnosis present

## 2015-09-11 DIAGNOSIS — F418 Other specified anxiety disorders: Secondary | ICD-10-CM | POA: Diagnosis not present

## 2015-09-11 DIAGNOSIS — Z79899 Other long term (current) drug therapy: Secondary | ICD-10-CM | POA: Diagnosis not present

## 2015-09-11 DIAGNOSIS — M5441 Lumbago with sciatica, right side: Secondary | ICD-10-CM

## 2015-09-11 DIAGNOSIS — I6523 Occlusion and stenosis of bilateral carotid arteries: Secondary | ICD-10-CM | POA: Diagnosis not present

## 2015-09-11 DIAGNOSIS — Z87891 Personal history of nicotine dependence: Secondary | ICD-10-CM | POA: Diagnosis not present

## 2015-09-11 DIAGNOSIS — Z8249 Family history of ischemic heart disease and other diseases of the circulatory system: Secondary | ICD-10-CM | POA: Diagnosis not present

## 2015-09-11 DIAGNOSIS — F329 Major depressive disorder, single episode, unspecified: Secondary | ICD-10-CM | POA: Diagnosis present

## 2015-09-11 DIAGNOSIS — G629 Polyneuropathy, unspecified: Secondary | ICD-10-CM | POA: Diagnosis not present

## 2015-09-11 DIAGNOSIS — M5416 Radiculopathy, lumbar region: Secondary | ICD-10-CM

## 2015-09-11 DIAGNOSIS — Z969 Presence of functional implant, unspecified: Secondary | ICD-10-CM

## 2015-09-11 HISTORY — DX: Type 2 diabetes mellitus without complications: E11.9

## 2015-09-11 LAB — LIPID PANEL
Cholesterol: 120 mg/dL (ref 0–200)
HDL: 41 mg/dL (ref >40)
LDL Cholesterol: 62 mg/dL (ref 0–99)
Total CHOL/HDL Ratio: 2.9 RATIO
Triglycerides: 86 mg/dL (ref <150)
VLDL: 17 mg/dL (ref 0–40)

## 2015-09-11 LAB — CSF CELL COUNT WITH DIFFERENTIAL
RBC Count, CSF: 400 /mm3 — ABNORMAL HIGH
RBC Count, CSF: 470 /mm3 — ABNORMAL HIGH
Tube #: 1
Tube #: 4
WBC, CSF: 1 /mm3 (ref 0–5)
WBC, CSF: 1 /mm3 (ref 0–5)

## 2015-09-11 LAB — PROTIME-INR
INR: 1.12 (ref 0.00–1.49)
Prothrombin Time: 14.6 seconds (ref 11.6–15.2)

## 2015-09-11 LAB — PROTEIN AND GLUCOSE, CSF
Glucose, CSF: 71 mg/dL — ABNORMAL HIGH (ref 40–70)
Total  Protein, CSF: 48 mg/dL — ABNORMAL HIGH (ref 15–45)

## 2015-09-11 LAB — GLUCOSE, CAPILLARY
Glucose-Capillary: 97 mg/dL (ref 65–99)
Glucose-Capillary: 99 mg/dL (ref 65–99)
Glucose-Capillary: 149 mg/dL — ABNORMAL HIGH (ref 65–99)
Glucose-Capillary: 134 mg/dL — ABNORMAL HIGH (ref 65–99)

## 2015-09-11 LAB — RAPID URINE DRUG SCREEN, HOSP PERFORMED
Amphetamines: NOT DETECTED
Barbiturates: NOT DETECTED
Benzodiazepines: NOT DETECTED
Cocaine: NOT DETECTED
Opiates: NOT DETECTED
Tetrahydrocannabinol: NOT DETECTED

## 2015-09-11 LAB — TROPONIN I
Troponin I: <0.03 ng/mL (ref <0.031)
Troponin I: <0.03 ng/mL (ref <0.031)
Troponin I: <0.03 ng/mL (ref <0.031)
Troponin I: <0.03 ng/mL (ref <0.031)

## 2015-09-11 LAB — APTT: aPTT: 31 seconds (ref 24–37)

## 2015-09-11 MED ORDER — PAROXETINE HCL 20 MG PO TABS
20.0000 mg | ORAL_TABLET | Freq: Every day | ORAL | Status: DC
  Administered 2015-09-11 – 2015-09-13 (×3): 20 mg via ORAL
  Filled 2015-09-11 (×4): qty 1

## 2015-09-11 MED ORDER — DIPHENHYDRAMINE HCL 50 MG/ML IJ SOLN
25.0000 mg | Freq: Once | INTRAMUSCULAR | Status: AC
  Administered 2015-09-11: 25 mg via INTRAVENOUS
  Filled 2015-09-11: qty 1

## 2015-09-11 MED ORDER — POLYETHYLENE GLYCOL 3350 17 G PO PACK
17.0000 g | PACK | Freq: Every day | ORAL | Status: DC
  Filled 2015-09-11 (×3): qty 1

## 2015-09-11 MED ORDER — ROSUVASTATIN CALCIUM 20 MG PO TABS
20.0000 mg | ORAL_TABLET | Freq: Every day | ORAL | Status: DC
  Administered 2015-09-11 – 2015-09-12 (×3): 20 mg via ORAL
  Filled 2015-09-11 (×3): qty 1
  Filled 2015-09-11 (×3): qty 2

## 2015-09-11 MED ORDER — ISOSORBIDE MONONITRATE ER 30 MG PO TB24
30.0000 mg | ORAL_TABLET | Freq: Every day | ORAL | Status: DC
  Administered 2015-09-11 – 2015-09-13 (×3): 30 mg via ORAL
  Filled 2015-09-11 (×3): qty 1

## 2015-09-11 MED ORDER — ACETAMINOPHEN 325 MG PO TABS
650.0000 mg | ORAL_TABLET | ORAL | Status: DC | PRN
  Administered 2015-09-11 – 2015-09-12 (×4): 650 mg via ORAL
  Filled 2015-09-11 (×4): qty 2

## 2015-09-11 MED ORDER — SODIUM CHLORIDE 0.9 % IV SOLN
INTRAVENOUS | Status: DC
  Administered 2015-09-11: 04:00:00 via INTRAVENOUS

## 2015-09-11 MED ORDER — NITROGLYCERIN 0.4 MG SL SUBL: 0.4000 mg | SUBLINGUAL_TABLET | SUBLINGUAL | Status: DC | PRN

## 2015-09-11 MED ORDER — OXYCODONE HCL 5 MG PO TABS
5.0000 mg | ORAL_TABLET | Freq: Three times a day (TID) | ORAL | Status: DC | PRN
  Administered 2015-09-11 – 2015-09-13 (×5): 5 mg via ORAL
  Filled 2015-09-11 (×5): qty 1

## 2015-09-11 MED ORDER — OXYCODONE HCL 5 MG PO TABS
5.0000 mg | ORAL_TABLET | Freq: Once | ORAL | Status: AC
  Administered 2015-09-11: 5 mg via ORAL
  Filled 2015-09-11: qty 1

## 2015-09-11 MED ORDER — KCL IN DEXTROSE-NACL 20-5-0.45 MEQ/L-%-% IV SOLN
INTRAVENOUS | Status: DC
  Administered 2015-09-11: 14:00:00 via INTRAVENOUS
  Filled 2015-09-11 (×7): qty 1000

## 2015-09-11 MED ORDER — ALPRAZOLAM 0.25 MG PO TABS: 0.2500 mg | ORAL_TABLET | Freq: Two times a day (BID) | ORAL | Status: DC | PRN

## 2015-09-11 MED ORDER — BUPROPION HCL ER (XL) 150 MG PO TB24
150.0000 mg | ORAL_TABLET | Freq: Every day | ORAL | Status: DC
  Administered 2015-09-11 – 2015-09-13 (×3): 150 mg via ORAL
  Filled 2015-09-11 (×3): qty 1

## 2015-09-11 MED ORDER — ONDANSETRON HCL 4 MG/2ML IJ SOLN: 4.0000 mg | Freq: Four times a day (QID) | INTRAMUSCULAR | Status: DC | PRN

## 2015-09-11 MED ORDER — STROKE: EARLY STAGES OF RECOVERY BOOK
Freq: Once | Status: DC
  Filled 2015-09-11: qty 1

## 2015-09-11 MED ORDER — TIZANIDINE HCL 4 MG PO TABS
4.0000 mg | ORAL_TABLET | Freq: Four times a day (QID) | ORAL | Status: DC | PRN
  Administered 2015-09-11 – 2015-09-12 (×3): 4 mg via ORAL
  Filled 2015-09-11 (×5): qty 1

## 2015-09-11 MED ORDER — ISOSORBIDE DINITRATE 30 MG PO TABS
30.0000 mg | ORAL_TABLET | Freq: Every day | ORAL | Status: DC
  Administered 2015-09-11: 30 mg via ORAL
  Filled 2015-09-11: qty 3
  Filled 2015-09-11: qty 1

## 2015-09-11 MED ORDER — METOCLOPRAMIDE HCL 5 MG/ML IJ SOLN
10.0000 mg | Freq: Once | INTRAMUSCULAR | Status: AC
  Administered 2015-09-11: 10 mg via INTRAVENOUS
  Filled 2015-09-11: qty 2

## 2015-09-11 MED ORDER — PREGABALIN 50 MG PO CAPS
100.0000 mg | ORAL_CAPSULE | Freq: Three times a day (TID) | ORAL | Status: DC
  Administered 2015-09-11 – 2015-09-13 (×7): 100 mg via ORAL
  Filled 2015-09-11 (×7): qty 2

## 2015-09-11 NOTE — H&P (Signed)
[facility] History and Physical  Melinda [name] HER:[id number] DOB: [birth year] DOA: 09/11/2015  Referring physician: ED physician PCP: [name], NP  Specialists:   Chief Complaint: Sudden onset of severe headache and chest pain  HPI: Melinda [name] is a 60 y.o. female with PMH of hyperlipidemia, depression, CAD, S/P stent placement 2009, spinal stenosis, bulging disc, chronic back pain on baclofen plus fentanyl pump, OSA on CPAP, bilateral carotid artery stenosis, who presents with sudden onset of severe headache and chest pain.  Patient reports that she started having sudden onset severe headache at about 3 PM. The headache initially was located in the occipital area, which has progressively to have involved the top of her head and behind eyes. The headache is severe, 10 out of 10 in severity, sharp, nonradiating. Patient reports that she may have had mild weakness in the right arm per her partner, but currently no weakness, numbness or tingliness in her extremities.  Patient also reports having chest pain which started at 4 PM. It is located in the left chest, mild, 2 out of 10 in severity, pressure-like pain. No cough, shortness of breath, fever or chills. No tenderness over calf area. Patient does not have abdominal pain, nausea, vomiting, diarrhea, symptoms of UTI.   In ED, patient was found to have negative troponin, WBC 96, temperature normal, no tachycardia, electrolytes and renal function okay, negative chest x-ray, negative CT-head for l acute intracranial abnormalities. Patient is admitted to inpatient for further evaluation treatment.  EKG: Independently reviewed. QTC 409, nonspecific T-wave change.  Where does patient live?   At home  Can patient participate in ADLs?  Yes  Review of Systems:   General: no fevers, chills, no changes in body weight, has fatigue HEENT: no blurry vision, hearing changes or sore throat Pulm: no dyspnea, coughing, wheezing CV: has chest  pain, no palpitations Abd: no nausea, vomiting, abdominal pain, diarrhea, constipation GU: no dysuria, burning on urination, increased urinary frequency, hematuria  Ext: no leg edema Neuro: Questionable right arm weakness, No vision change or hearing loss Skin: no rash MSK: has chronic back pain Heme: No easy bruising.  Travel history: No recent long distant travel.  Allergy: No Known Allergies  Past Medical History  Diagnosis Date  . Degenerative disc disease   . Spinal stenosis   . Facet joint disease   . Bulging disc   . MRSA (methicillin resistant staph aureus) culture positive 2011    GREAT TOE RIGHT FOOT  . Fibromyalgia   . Peripheral neuropathy ([place])   . MCL deficiency, knee   . Coronary arteritis   . CAD (coronary artery disease) 2009    s/p stent to LAD  . Hyperlipidemia   . Heart disease   . 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 Chicken pox   . Anemia     years ago  . Sleep apnea     uses CPAP, sleep study at [facility]  . Neuropathy [facility]) 04/18/2010    Past Surgical History  Procedure Laterality Date  . Foot surgery Right     BIG TOE  . Hand surgery Right   . Gallbladder surgery    . Ablation      UTERUS  . Ablation      HEART  . Heart stent  2009    LAD  . Hand surgey Left   . Foot surgery Bilateral     PLANTAR FASCIITIS  . Foot surgery Right  2ND TOE  . Infusion pump implantation      X2 with morphine and baclofen  . Eye surgery Bilateral   . Back surgery      X 3 1979, 1994, 1995  . Fracture surgery Right 2012    carpal tunnel  . Cholecystectomy  2003  . Anterior cervical decomp/discectomy fusion N/A 07/28/2014    Procedure: ANTERIOR CERVICAL DECOMPRESSION/DISCECTOMY FUSION CERVICAL 3-4,4-5,5-6 LEVELS WITH INSTRUMENTATION AND ALLOGRAFT;  Surgeon: Sinclair Ship, MD;  Location: [place];  Service: Orthopedics;  Laterality: N/A;  Anterior cervical decompression fusion, cervical 3-4, cervical 4-5, cervical 5-6  with instrumentation and allograft    Social History:  reports that she quit smoking about 8 years ago. Her smoking use included Cigarettes. She has a 48 pack-year smoking history. She has never used smokeless tobacco. She reports that she drinks alcohol. She reports that she does not use illicit drugs.  Family History:  Family History  Problem Relation Age of Onset  . Cancer Mother     lung  . Diabetes Father   . Heart disease Maternal Grandfather   . Diabetes Paternal Grandmother   . Cancer Paternal Grandfather     colon  . Stroke Neg Hx      Prior to Admission medications   Medication Sig Start Date End Date Taking? Authorizing Provider  aspirin 81 MG tablet Take 81 mg by mouth daily.   Yes Historical Provider, MD  buPROPion (WELLBUTRIN XL) 150 MG 24 hr tablet TAKE 1 TABLET BY MOUTH DAILY 11/23/14  Yes Jearld Fenton, NP  isosorbide dinitrate (ISORDIL) 30 MG tablet TAKE 1 TABLET BY MOUTH DAILY 06/08/15  Yes Jerline Pain, MD  nitroGLYCERIN (NITROSTAT) 0.4 MG SL tablet Place 1 tablet (0.4 mg total) under the tongue every 5 (five) minutes as needed for chest pain. 06/09/14  Yes Jerline Pain, MD  oxyCODONE (OXY IR/ROXICODONE) 5 MG immediate release tablet Take 1 tablet (5 mg total) by mouth every 8 (eight) hours as needed for moderate pain or severe pain. 07/27/15  Yes [name], MD  PAIN MANAGEMENT IT PUMP REFILL 1 each by Intrathecal route once. Medication: PF Fentanyl 500.0 mcg/ml PF Baclofen 300.52mg/ml PF Bupivicaine 20.0 mg/ml Total Volume: 40 ml Needed by 09-20-15 @ 1440 09/20/15  Yes FMilinda Pointer MD  PARoxetine (PAXIL) 20 MG tablet TAKE ONE-HALF (1/2) OF A TABLET BY MOUTHTWICE DAILY 06/24/15  Yes RJearld Fenton NP  polyethylene glycol (MIRALAX / GLYCOLAX) packet Take 17 g by mouth daily.   Yes Historical Provider, MD  pregabalin (LYRICA) 100 MG capsule Take 1 capsule (100 mg total) by mouth 3 (three) times daily. 07/27/15 09/11/15 Yes FMilinda Pointer MD   rosuvastatin (CRESTOR) 20 MG tablet Take 1 tablet (20 mg total) by mouth daily. 07/29/15  Yes MJerline Pain MD  tiZANidine (ZANAFLEX) 4 MG tablet Take 1 tablet (4 mg total) by mouth every 6 (six) hours as needed. 07/27/15  Yes FMilinda Pointer MD  ACCU-CHEK AVIVA PLUS test strip TEST 2 TIMES DAY AS NEEDED **E11.9** 08/02/15   RJearld Fenton NP  ACCU-CHEK SOFTCLIX LANCETS lancets 1 each by Other route 2 (two) times daily. Use as instructed 03/02/15   RJearld Fenton NP  AMBULATORY NON FORMULARY MEDICATION Medication Name: CPAP MASK OF CHOICE FOR HOME DEVICE 07/01/15   PLaverle Hobby MD  Blood Glucose Calibration (ACCU-CHEK AVIVA) SOLN 1 each by In Vitro route as needed. Patient taking differently: 1 each by Other route See admin instructions. Check  blood sugar once daily. 01/21/14   Jearld Fenton, NP  Blood Glucose Monitoring Suppl (ACCU-CHEK AVIVA PLUS) W/DEVICE KIT 1 Device by Does not apply route once. Patient taking differently: 1 each by Other route See admin instructions. Check blood sugar once daily. 01/21/14   Jearld Fenton, NP    Physical Exam: Filed Vitals:   09/11/15 0115 09/11/15 0318 09/11/15 0345 09/11/15 0400  BP: 133/64  153/80 168/91  Pulse: 68  69 66  Temp:  98.1 F (36.7 C)    TempSrc:      Resp: _0 Height:      Weight:      SpO2: 95%  98% 98%   General: Not in acute distress HEENT:       Eyes: PERRL, EOMI, no scleral icterus.       ENT: No discharge from the ears and nose, no pharynx injection, no tonsillar enlargement.        Neck: No JVD, no bruit, no mass felt. Heme: No neck lymph node enlargement. Cardiac: S1/S2, RRR, No murmurs, No gallops or rubs. Pulm: No rales, wheezing, rhonchi or rubs. Abd: Soft, nondistended, nontender, no rebound pain, no organomegaly, BS present. Has baclofen pump under skin of lower abdomen. Ext: No pitting leg edema bilaterally. 2+DP/PT pulse bilaterally. Musculoskeletal: No joint deformities, No joint redness or  warmth, no limitation of ROM in spin. Skin: No rashes.  Neuro: Alert, oriented X3, cranial nerves II-XII grossly intact, moves all extremities normally. Muscle strength 5/5 in all extremities, sensation to light touch intact. Knee reflex 1+ bilaterally. Negative Babinski's sign. Normal finger to nose test. Psych: Patient is not psychotic, no suicidal or hemocidal ideation.  Labs on Admission:  Basic Metabolic Panel:  Recent Labs Lab 09/10/15 1751  NA 140  K 4.6  CL 102  CO2 27  GLUCOSE 143*  BUN 25*  CREATININE 1.00  CALCIUM 9.1   Liver Function Tests: No results for input(s): AST, ALT, ALKPHOS, BILITOT, PROT, ALBUMIN in the last 168 hours. No results for input(s): LIPASE, AMYLASE in the last 168 hours. No results for input(s): AMMONIA in the last 168 hours. CBC:  Recent Labs Lab 09/10/15 1751  WBC 9.6  HGB 12.7  HCT 38.9  MCV 86.6  PLT 206   Cardiac Enzymes:  Recent Labs Lab 09/11/15 0045  TROPONINI <0.03    BNP (last 3 results) No results for input(s): BNP in the last 8760 hours.  ProBNP (last 3 results) No results for input(s): PROBNP in the last 8760 hours.  CBG: No results for input(s): GLUCAP in the last 168 hours.  Radiological Exams on Admission: Dg Chest 2 View  09/10/2015  CLINICAL DATA:  Left-sided chest pressure for 4 hours. EXAM: CHEST  2 VIEW COMPARISON:  10/04/2013 FINDINGS: The cardiomediastinal contours are normal. The lungs are clear. Pulmonary vasculature is normal. No consolidation, pleural effusion, or pneumothorax. No acute osseous abnormalities are seen. Surgical hardware in the lower cervical spine, partially included and new from prior exam. IMPRESSION: No acute pulmonary process. Electronically Signed   By: Jeb Levering M.D.   On: 09/10/2015 19:12   Ct Head Wo Contrast  09/10/2015  CLINICAL DATA:  Headache during heated argument EXAM: CT HEAD WITHOUT CONTRAST TECHNIQUE: Contiguous axial images were obtained from the base of the  skull through the vertex without intravenous contrast. 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 No acute  intracranial abnormality. Specifically, no hemorrhage, hydrocephalus, mass lesion, acute infarction, or significant intracranial injury. No acute calvarial abnormality. Rounded soft tissue in the left maxillary sinus, likely mucous retention cyst or polyp, stable. Paranasal sinuses and mastoid air cells otherwise clear. IMPRESSION: No acute intracranial abnormality. Electronically Signed   By: Rolm Baptise M.D.   On: 09/10/2015 23:12    Assessment/Plan Principal Problem:   Sudden onset of severe headache Active Problems:   Chronic pain syndrome   CAD (coronary artery disease)   Anxiety and depression   Pure hypercholesterolemia   Chronic low back pain (Bilateral) (L>R)   Chronic lumbar radicular pain (Left)   Long term current use of opiate analgesic   Presence of functional implant ([employer] programmable intrathecal pump) (Right abdominal area)   Chest pain   Depression   HLD (hyperlipidemia)   Sudden onset of severe headache: Etiology is not clear. CT head is negative for intracranial abnormalities. The character of her HA is very concerning for subarachnoid hemorrhage. No signs of infection, less likely to have meningitis. A potential differential diagnosis is stroke, but the patient cannot do MRI due to presence of baclofen pump. Per patient, pump must be turned off using special instrument before MRI can be done.  -will admit to tele bed -swallow screen -will consult to IR for LP looking for xanthochromia. -Frequent neuro check  CAD and Chest pain: Patient has mild atypical chest pain. Initial troponin negative, EKG showed nonspecific T-wave change. Given her risk factors, including history of stent placement, hyperlipidemia, will do chest pain rule out work up - cycle CE q6 x3 and  repeat her EKG in the am  - Nitroglycerin, Morphine, Isordil and crestor - hold off ASA due to possible subarachnoid hemorrhage - Risk factor stratification: will check FLP, UDS and A1C  - 2d echo  Chronic lumbar radicular pain:  -On baclofen plus fentanyl pump and prn oxycodone -Continue Lyric and tizanidine  Depression and anxiety: Stable, no suicidal or homicidal ideations. -Continue home medications: Paxil and Wellbutrin  HLD: Last LDL was 40 07/28/15 -Continue home medications: Crestor   DVT ppx: SCD  Code Status: Full code Family Communication: None at bed side.  Disposition Plan: Admit to inpatient   Date of Service 09/11/2015    Ivor Costa [facility] Pager [phone]  If 7PM-7AM, please contact night-coverage [email or web address] Password [facility] 09/11/2015, 4:50 AM

## 2015-09-11 NOTE — ED Notes (Signed)
Report called to hilda on 3 w

## 2015-09-11 NOTE — ED Notes (Signed)
Headache better up to br

## 2015-09-11 NOTE — Consult Note (Signed)
Admission H&P    Chief Complaint: Severe headache.  HPI: Melinda [name] is an 60 y.o. female with a history of degenerative disc disease, fibromyalgia, peripheral neuropathy, coronary artery disease, hyperlipidemia and sleep apnea, who experienced sudden severe during an intense disagreement. She describes the pain as pain in the back of her neck that radiated to her frontal area and behind her eyes. She also noticed weakness involving right arm and hand area there was no numbness or tingling. She had no change in speech. There was no facial droop. CT scan of her head showed no acute intracranial abnormality. Patient was afebrile. Headache is improved with analgesic medication. Current intensity of her headache is 2/10. Is no previous history of stroke nor TIA. She also has no history of severe headaches.  Past Medical History  Diagnosis Date  . Degenerative disc disease   . Spinal stenosis   . Facet joint disease   . Bulging disc   . MRSA (methicillin resistant staph aureus) culture positive 2011    GREAT TOE RIGHT FOOT  . Fibromyalgia   . Peripheral neuropathy ([place])   . MCL deficiency, knee   . Coronary arteritis   . CAD (coronary artery disease) 2009    s/p stent to LAD  . Hyperlipidemia   . Heart disease   . 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 Chicken pox   . Anemia     years ago  . Sleep apnea     uses CPAP, sleep study at [facility]  . Neuropathy [facility]) 04/18/2010    Past Surgical History  Procedure Laterality Date  . Foot surgery Right     BIG TOE  . Hand surgery Right   . Gallbladder surgery    . Ablation      UTERUS  . Ablation      HEART  . Heart stent  2009    LAD  . Hand surgey Left   . Foot surgery Bilateral     PLANTAR FASCIITIS  . Foot surgery Right     2ND TOE  . Infusion pump implantation      X2 with morphine and baclofen  . Eye surgery Bilateral   . Back surgery      X 3 1979, 1994, 1995  . Fracture surgery Right 2012     carpal tunnel  . Cholecystectomy  2003  . Anterior cervical decomp/discectomy fusion N/A 07/28/2014    Procedure: ANTERIOR CERVICAL DECOMPRESSION/DISCECTOMY FUSION CERVICAL 3-4,4-5,5-6 LEVELS WITH INSTRUMENTATION AND ALLOGRAFT;  Surgeon: Sinclair Ship, MD;  Location: [place];  Service: Orthopedics;  Laterality: N/A;  Anterior cervical decompression fusion, cervical 3-4, cervical 4-5, cervical 5-6 with instrumentation and allograft    Family History  Problem Relation Age of Onset  . Cancer Mother     lung  . Diabetes Father   . Heart disease Maternal Grandfather   . Diabetes Paternal Grandmother   . Cancer Paternal Grandfather     colon  . Stroke Neg Hx    Social History:  reports that she quit smoking about 8 years ago. Her smoking use included Cigarettes. She has a 48 pack-year smoking history. She has never used smokeless tobacco. She reports that she drinks alcohol. She reports that she does not use illicit drugs.  Allergies: No Known Allergies  Medications Prior to Admission  Medication Sig Dispense Refill  . aspirin 81 MG tablet Take 81 mg by mouth daily.    Marland Kitchen buPROPion (WELLBUTRIN XL) 150  MG 24 hr tablet TAKE 1 TABLET BY MOUTH DAILY 90 tablet 3  . isosorbide dinitrate (ISORDIL) 30 MG tablet TAKE 1 TABLET BY MOUTH DAILY 30 tablet 8  . nitroGLYCERIN (NITROSTAT) 0.4 MG SL tablet Place 1 tablet (0.4 mg total) under the tongue every 5 (five) minutes as needed for chest pain. 25 tablet 2  . oxyCODONE (OXY IR/ROXICODONE) 5 MG immediate release tablet Take 1 tablet (5 mg total) by mouth every 8 (eight) hours as needed for moderate pain or severe pain. 90 tablet 0  . [START ON 09/20/2015] PAIN MANAGEMENT IT PUMP REFILL 1 each by Intrathecal route once. Medication: PF Fentanyl 500.0 mcg/ml PF Baclofen 300.59mg/ml PF Bupivicaine 20.0 mg/ml Total Volume: 40 ml Needed by 09-20-15 @ 1440 1 each 0  . PARoxetine (PAXIL) 20 MG tablet TAKE ONE-HALF (1/2) OF A TABLET BY MOUTHTWICE DAILY 90  tablet 0  . polyethylene glycol (MIRALAX / GLYCOLAX) packet Take 17 g by mouth daily.    . pregabalin (LYRICA) 100 MG capsule Take 1 capsule (100 mg total) by mouth 3 (three) times daily. 90 capsule 2  . rosuvastatin (CRESTOR) 20 MG tablet Take 1 tablet (20 mg total) by mouth daily. 30 tablet 7  . tiZANidine (ZANAFLEX) 4 MG tablet Take 1 tablet (4 mg total) by mouth every 6 (six) hours as needed. 120 tablet 2  . ACCU-CHEK AVIVA PLUS test strip TEST 2 TIMES DAY AS NEEDED **E11.9** 100 each 6  . ACCU-CHEK SOFTCLIX LANCETS lancets 1 each by Other route 2 (two) times daily. Use as instructed 100 each 2  . AMBULATORY NON FORMULARY MEDICATION Medication Name: CPAP MASK OF CHOICE FOR HOME DEVICE 1 each 0  . Blood Glucose Calibration (ACCU-CHEK AVIVA) SOLN 1 each by In Vitro route as needed. (Patient taking differently: 1 each by Other route See admin instructions. Check blood sugar once daily.) 1 each 1  . Blood Glucose Monitoring Suppl (ACCU-CHEK AVIVA PLUS) W/DEVICE KIT 1 Device by Does not apply route once. (Patient taking differently: 1 each by Other route See admin instructions. Check blood sugar once daily.) 1 kit 0    ROS: History obtained from the patient  General ROS: negative for - chills, fatigue, fever, night sweats, weight gain or weight loss Psychological ROS: negative for - behavioral disorder, hallucinations, memory difficulties, mood swings or suicidal ideation Ophthalmic ROS: negative for - blurry vision, double vision, eye pain or loss of vision ENT ROS: negative for - epistaxis, nasal discharge, oral lesions, sore throat, tinnitus or vertigo Allergy and Immunology ROS: negative for - hives or itchy/watery eyes Hematological and Lymphatic ROS: negative for - bleeding problems, bruising or swollen lymph nodes Endocrine ROS: negative for - galactorrhea, hair pattern changes, polydipsia/polyuria or temperature intolerance Respiratory ROS: negative for - cough, hemoptysis, shortness of  breath or wheezing Cardiovascular ROS: negative for - chest pain, dyspnea on exertion, edema or irregular heartbeat Gastrointestinal ROS: negative for - abdominal pain, diarrhea, hematemesis, nausea/vomiting or stool incontinence Genito-Urinary ROS: negative for - dysuria, hematuria, incontinence or urinary frequency/urgency Musculoskeletal ROS: negative for - joint swelling or muscular weakness Neurological ROS: as noted in HPI Dermatological ROS: negative for rash and skin lesion changes  Physical Examination: Blood pressure 109/72, pulse 77, temperature 98.6 F (37 C), temperature source Oral, resp. rate 16, height '5\' 9"'$  (1.753 m), weight 102 kg (224 lb 13.9 oz), SpO2 94 %.  HEENT-  Normocephalic, no lesions, without obvious abnormality.  Normal external eye and conjunctiva.  Normal TM's bilaterally.  Normal auditory canals and external ears. Normal external nose, mucus membranes and septum.  Normal pharynx. Neck supple with no masses, nodes, nodules or enlargement. There were no signs of meningeal irritation. Cardiovascular - regular rate and rhythm, S1, S2 normal, no murmur, click, rub or gallop Lungs - chest clear, no wheezing, rales, normal symmetric air entry Abdomen - soft, non-tender; bowel sounds normal; no masses,  no organomegaly Extremities - no edema and no skin discoloration  Neurologic Examination: Mental Status: Alert, oriented, thought content appropriate.  Speech fluent without evidence of aphasia. Able to follow commands without difficulty. Cranial Nerves: II-Visual fields were normal. III/IV/VI-Pupils were equal and reacted only to light. Extraocular movements were full and conjugate.    V/VII-no facial numbness and no facial weakness. VIII-normal. X-normal speech and symmetrical palatal movement. XI: trapezius strength/neck flexion strength normal bilaterally XII-midline tongue extension with normal strength. Motor: 5/5 bilaterally with normal tone and  bulk Sensory: Reduced vibratory sensation distally and lower extremities. Deep Tendon Reflexes: 1+ and symmetric. Plantars: Mute bilaterally Cerebellar: Normal finger-to-nose testing.  Results for orders placed or performed during the hospital encounter of 09/11/15 (from the past 48 hour(s))  Basic metabolic panel     Status: Abnormal   Collection Time: 09/10/15  5:51 PM  Result Value Ref Range   Sodium 140 135 - 145 mmol/L   Potassium 4.6 3.5 - 5.1 mmol/L   Chloride 102 101 - 111 mmol/L   CO2 27 22 - 32 mmol/L   Glucose, Bld 143 (H) 65 - 99 mg/dL   BUN 25 (H) 6 - 20 mg/dL   Creatinine, Ser 1.00 0.44 - 1.00 mg/dL   Calcium 9.1 8.9 - 10.3 mg/dL   GFR calc non Af Amer >60 >60 mL/min   GFR calc Af Amer >60 >60 mL/min    Comment: (NOTE) The eGFR has been calculated using the CKD EPI equation. This calculation has not been validated in all clinical situations. eGFR's persistently <60 mL/min signify possible Chronic Kidney Disease.    Anion gap 11 5 - 15  CBC     Status: None   Collection Time: 09/10/15  5:51 PM  Result Value Ref Range   WBC 9.6 4.0 - 10.5 K/uL   RBC 4.49 3.87 - 5.11 MIL/uL   Hemoglobin 12.7 12.0 - 15.0 g/dL   HCT 38.9 36.0 - 46.0 %   MCV 86.6 78.0 - 100.0 fL   MCH 28.3 26.0 - 34.0 pg   MCHC 32.6 30.0 - 36.0 g/dL   RDW 12.8 11.5 - 15.5 %   Platelets 206 150 - 400 K/uL  I-stat troponin, ED (not at [facility], [facility])     Status: None   Collection Time: 09/10/15  5:59 PM  Result Value Ref Range   Troponin i, poc 0.01 0.00 - 0.08 ng/mL   Comment 3            Comment: Due to the release kinetics of cTnI, a negative result within the first hours of the onset of symptoms does not rule out myocardial infarction with certainty. If myocardial infarction is still suspected, repeat the test at appropriate intervals.   Troponin I     Status: None   Collection Time: 09/11/15 12:45 AM  Result Value Ref Range   Troponin I <0.03 <0.031 ng/mL    Comment:        NO INDICATION  OF MYOCARDIAL INJURY.   Protime-INR     Status: None   Collection Time: 09/11/15  4:20 AM  Result Value Ref Range   Prothrombin Time 14.6 11.6 - 15.2 seconds   INR 1.12 0.00 - 1.49  APTT     Status: None   Collection Time: 09/11/15  4:20 AM  Result Value Ref Range   aPTT 31 24 - 37 seconds  Troponin I     Status: None   Collection Time: 09/11/15  4:20 AM  Result Value Ref Range   Troponin I <0.03 <0.031 ng/mL    Comment:        NO INDICATION OF MYOCARDIAL INJURY.   Lipid panel     Status: None   Collection Time: 09/11/15  4:21 AM  Result Value Ref Range   Cholesterol 120 0 - 200 mg/dL   Triglycerides 86 <150 mg/dL   HDL 41 >40 mg/dL   Total CHOL/HDL Ratio 2.9 RATIO   VLDL 17 0 - 40 mg/dL   LDL Cholesterol 62 0 - 99 mg/dL    Comment:        Total Cholesterol/HDL:CHD Risk Coronary Heart Disease Risk Table                     Men   Women  1/2 Average Risk   3.4   3.3  Average Risk       5.0   4.4  2 X Average Risk   9.6   7.1  3 X Average Risk  23.4   11.0        Use the calculated Patient Ratio above and the CHD Risk Table to determine the patient's CHD Risk.        ATP III CLASSIFICATION (LDL):  <100     mg/dL   Optimal  100-129  mg/dL   Near or Above                    Optimal  130-159  mg/dL   Borderline  160-189  mg/dL   High  >190     mg/dL   Very High   Urine rapid drug screen (hosp performed)     Status: None   Collection Time: 09/11/15  5:32 AM  Result Value Ref Range   Opiates NONE DETECTED NONE DETECTED   Cocaine NONE DETECTED NONE DETECTED   Benzodiazepines NONE DETECTED NONE DETECTED   Amphetamines NONE DETECTED NONE DETECTED   Tetrahydrocannabinol NONE DETECTED NONE DETECTED   Barbiturates NONE DETECTED NONE DETECTED    Comment:        DRUG SCREEN FOR MEDICAL PURPOSES ONLY.  IF CONFIRMATION IS NEEDED FOR ANY PURPOSE, NOTIFY LAB WITHIN 5 DAYS.        LOWEST DETECTABLE LIMITS FOR URINE DRUG SCREEN Drug Class       Cutoff (ng/mL) Amphetamine       1000 Barbiturate      200 Benzodiazepine   741 Tricyclics       638 Opiates          300 Cocaine          300 THC              50   Glucose, capillary     Status: None   Collection Time: 09/11/15  8:08 AM  Result Value Ref Range   Glucose-Capillary 97 65 - 99 mg/dL  Troponin I     Status: None   Collection Time: 09/11/15 11:06 AM  Result Value Ref Range   Troponin I <0.03 <0.031 ng/mL    Comment:  NO INDICATION OF MYOCARDIAL INJURY.   Glucose, capillary     Status: None   Collection Time: 09/11/15 12:10 PM  Result Value Ref Range   Glucose-Capillary 99 65 - 99 mg/dL   Dg Chest 2 View  09/10/2015  CLINICAL DATA:  Left-sided chest pressure for 4 hours. EXAM: CHEST  2 VIEW COMPARISON:  10/04/2013 FINDINGS: The cardiomediastinal contours are normal. The lungs are clear. Pulmonary vasculature is normal. No consolidation, pleural effusion, or pneumothorax. No acute osseous abnormalities are seen. Surgical hardware in the lower cervical spine, partially included and new from prior exam. IMPRESSION: No acute pulmonary process. Electronically Signed   By: Jeb Levering M.D.   On: 09/10/2015 19:12   Ct Head Wo Contrast  09/10/2015  CLINICAL DATA:  Headache during heated argument EXAM: CT HEAD WITHOUT CONTRAST TECHNIQUE: Contiguous axial images were obtained from the base of the skull through the vertex without intravenous contrast. 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 No acute intracranial abnormality. Specifically, no hemorrhage, hydrocephalus, mass lesion, acute infarction, or significant intracranial injury. No acute calvarial abnormality. Rounded soft tissue in the left maxillary sinus, likely mucous retention cyst or polyp, stable. Paranasal sinuses and mastoid air cells otherwise clear. IMPRESSION: No acute intracranial abnormality. Electronically Signed   By: Rolm Baptise M.D.    On: 09/10/2015 23:12    Assessment/Plan 60 year old lady with sudden severe headache of unclear etiology. CT scan of her head did not show any signs of an acute cranial hemorrhage. Neck was supple with no signs of meningeal patient. Examination was unremarkable with no neurological deficits. Lumbar puncture was performed and CSF appeared clear and colorless, and was to lab for routine studies, including cell counts. Etiology for sudden severe headache is unclear. Acute migraine is unlikely with no history of migraine headaches but cannot be completely ruled out. TIA is also a consideration but also somewhat unlikely. Workup to assess risk for stroke is warranted, however.  Recommendations: 1. CT angiogram of head and neck with contrast 2. 2-D echocardiogram 3. Hemoglobin A1c and fasting lipid panel 4. Continue aspirin 81 mg per day  We will continue to follow this patient with you.   C.R. Nicole Kindred, [place] [facility]  [phone]  09/11/2015, 3:54 PM

## 2015-09-11 NOTE — Procedures (Signed)
LP Procedure Note:  Patient has been seen and examined.  Chart has been reviewed.  LP is being performed to rule out subarachnoid hemorrhage.  Procedure has been explained to patient/family including risks and benefits.  Consent has been obtained and witnessed.   Blood pressure 109/72, pulse 77, temperature 98.6 F (37 C), temperature source Oral, resp. rate 16, height 5\' 9"  (1.753 m), weight 102 kg (224 lb 13.9 oz), SpO2 94 %.   Recent Labs  09/10/15 1751 09/11/15 0420  WBC 9.6  --   HGB 12.7  --   HCT 38.9  --   PLT 206  --   INR  --  1.12    Patient was placed in the lateral decub/sitting position.  Area was cleaned with betadine and anesthetized with lidocaine.  Under sterile conditions 20G LP needle was placed at approximately L4-5 without difficulty.  Opening pressure was documented at 230.  Approximately 9cc of clear colorless fluid was obtained and sent for studies.  No complications were noted.    Rush Farmer M.D. [facility] [phone]  09/11/2015  3:51 PM

## 2015-09-11 NOTE — Progress Notes (Signed)
Paged Dr. Nicole Kindred to develop plan for patient as patient is hungry and wants to eat. Dr. Nicole Kindred gave verbal instructions to get items for Lumbar puncture and that he would be up to see patient. Patient updated and resting comfortably.

## 2015-09-11 NOTE — Progress Notes (Signed)
Patient seen and evaluated earlier this a.m. please refer to H&P regarding assessment and plan.  Patient resting comfortably  Vital signs reviewed Gen.: Patient in no acute distress CV: no cyanosis Pulm: no wheezes equal chest rise.  Neurology consulted for complicated Headache. Plans will be for LP  Melinda [name], [facility]

## 2015-09-11 NOTE — Progress Notes (Addendum)
Radiologist refused LP on patient because he feels the baclofen pump does not go down past T12. As such his argument is that it does not need imaging guidance  Dannae Kato, [facility]

## 2015-09-11 NOTE — ED Notes (Signed)
i was attempting to start iv when the admitting doctor appeared.  Med delayed until he finishes
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
## 2015-09-11 NOTE — ED Notes (Signed)
The pt passed the swallow screen 

## 2015-09-12 ENCOUNTER — Encounter (HOSPITAL_COMMUNITY): Payer: Self-pay | Admitting: General Practice

## 2015-09-12 ENCOUNTER — Inpatient Hospital Stay (HOSPITAL_COMMUNITY): Payer: Medicare Other

## 2015-09-12 DIAGNOSIS — R51 Headache: Secondary | ICD-10-CM

## 2015-09-12 DIAGNOSIS — R079 Chest pain, unspecified: Secondary | ICD-10-CM

## 2015-09-12 LAB — GLUCOSE, CAPILLARY
Glucose-Capillary: 128 mg/dL — ABNORMAL HIGH (ref 65–99)
Glucose-Capillary: 90 mg/dL (ref 65–99)
Glucose-Capillary: 84 mg/dL (ref 65–99)
Glucose-Capillary: 113 mg/dL — ABNORMAL HIGH (ref 65–99)

## 2015-09-12 LAB — ECHOCARDIOGRAM COMPLETE
Height: 69 in
Weight: 3580.8 oz

## 2015-09-12 LAB — HEMOGLOBIN A1C
Hgb A1c MFr Bld: 6.3 % — ABNORMAL HIGH (ref 4.8–5.6)
Mean Plasma Glucose: 134 mg/dL

## 2015-09-12 MED ORDER — DIPHENHYDRAMINE HCL 50 MG/ML IJ SOLN
25.0000 mg | Freq: Once | INTRAMUSCULAR | Status: AC
  Administered 2015-09-12: 25 mg via INTRAVENOUS
  Filled 2015-09-12: qty 1

## 2015-09-12 MED ORDER — IOHEXOL 350 MG/ML SOLN
75.0000 mL | Freq: Once | INTRAVENOUS | Status: AC | PRN
  Administered 2015-09-12: 75 mL via INTRAVENOUS

## 2015-09-12 MED ORDER — KETOROLAC TROMETHAMINE 30 MG/ML IJ SOLN
30.0000 mg | Freq: Once | INTRAMUSCULAR | Status: AC
  Administered 2015-09-12: 30 mg via INTRAVENOUS
  Filled 2015-09-12: qty 1

## 2015-09-12 MED ORDER — PROCHLORPERAZINE EDISYLATE 5 MG/ML IJ SOLN
10.0000 mg | Freq: Once | INTRAMUSCULAR | Status: AC
  Administered 2015-09-12: 10 mg via INTRAVENOUS
  Filled 2015-09-12: qty 2

## 2015-09-12 NOTE — Progress Notes (Signed)
[facility] PROGRESS NOTE  Melinda [name] [id number] DOB: Aug 05, 1955 DOA: 09/11/2015 PCP: [name], NP  Brief narrative: Patient is a 60 year old with history of DJD, fibromyalgia, peripheral neuropathy, CAD, hyperlipidemia who experienced sudden severe headache after an intense argument. She also endorsed weakness involving right arm and hand and reports no numbness or tingling. CT scan of head showed no acute intracranial abnormality per report.  Assessment/Plan: Principal Problem:   Sudden onset of severe headache - Neurology managing. Will defer further evaluation and recommendations to neurology - s/p LP - currently undergoing more work up  Active Problems:   Chronic pain syndrome - continue supportive therapy    CAD (coronary artery disease) - stable continue statin    Anxiety and depression   Pure hypercholesterolemia   Chronic low back pain (Bilateral) (L>R)   Chronic lumbar radicular pain (Left)   Long term current use of opiate analgesic   Presence of functional implant ([employer] programmable intrathecal pump) (Right abdominal area) -  Pt has baclofen pump internalized   Depression - continue paxil, stable   Code Status: full Family Communication: none Disposition Plan: pending neurology recommendations   Consultants:  Neurology  Procedures:  None  Antibiotics:  None  HPI/Subjective: Pt has no new complaints. No acute issues overnight. Still c/o HA  Objective: Filed Vitals:   09/12/15 0802 09/12/15 1143  BP:  131/69  Pulse:  75  Temp: 97.9 F (36.6 C) 98.7 F (37.1 C)  Resp:  15    Intake/Output Summary (Last 24 hours) at 09/12/15 1657 Last data filed at 09/12/15 1500  Gross per 24 hour  Intake    960 ml  Output   1600 ml  Net   -640 ml   Filed Weights   09/10/15 1738 09/11/15 0750 09/12/15 0400  Weight: 103.783 kg (228 lb 12.8 oz) 102 kg (224 lb 13.9 oz) 101.515 kg (223 lb 12.8 oz)    Exam:   General:  Pt in nad,  alert and awake  Cardiovascular: rrr, no rubs  Respiratory: no increased wob, no wheezes  Abdomen: soft, nd, nt  Musculoskeletal: no cyanosis or clubbing   Data Reviewed: Basic Metabolic Panel:  Recent Labs Lab 09/10/15 1751  NA 140  K 4.6  CL 102  CO2 27  GLUCOSE 143*  BUN 25*  CREATININE 1.00  CALCIUM 9.1   Liver Function Tests: No results for input(s): AST, ALT, ALKPHOS, BILITOT, PROT, ALBUMIN in the last 168 hours. No results for input(s): LIPASE, AMYLASE in the last 168 hours. No results for input(s): AMMONIA in the last 168 hours. CBC:  Recent Labs Lab 09/10/15 1751  WBC 9.6  HGB 12.7  HCT 38.9  MCV 86.6  PLT 206   Cardiac Enzymes:  Recent Labs Lab 09/11/15 0045 09/11/15 0420 09/11/15 1106 09/11/15 1555  TROPONINI <0.03 <0.03 <0.03 <0.03   BNP (last 3 results) No results for input(s): BNP in the last 8760 hours.  ProBNP (last 3 results) No results for input(s): PROBNP in the last 8760 hours.  CBG:  Recent Labs Lab 09/11/15 1210 09/11/15 1706 09/11/15 2044 09/12/15 0800 09/12/15 1145  GLUCAP 99 149* 134* 128* 90    Recent Results (from the past 240 hour(s))  CSF culture with Stat gram stain     Status: None (Preliminary result)   Collection Time: 09/11/15  6:28 PM  Result Value Ref Range Status   Specimen Description CSF  Final   Special Requests NONE  Final   Gram Stain  Final    CYTOSPIN SMEAR WBC PRESENT, PREDOMINANTLY PMN NO ORGANISMS SEEN    Culture NO GROWTH < 24 HOURS  Final   Report Status PENDING  Incomplete     Studies: Ct Angio Head W/cm &/or Wo Cm  09/12/2015  CLINICAL DATA:  Severe headaches since Saturday. EXAM: CT ANGIOGRAPHY HEAD AND NECK TECHNIQUE: Multidetector CT imaging of the head and neck was performed using the standard protocol during bolus administration of intravenous contrast. Multiplanar CT image reconstructions and MIPs were obtained to evaluate the vascular anatomy. Carotid stenosis measurements  (when applicable) are obtained utilizing NASCET criteria, using the distal internal carotid diameter as the denominator. CONTRAST:  65mL OMNIPAQUE IOHEXOL 350 MG/ML SOLN COMPARISON:  None. CT head without contrast 09/10/2014. FINDINGS: CT HEAD Brain: Noncontrast imaging demonstrates no significant interval change. No acute infarct, hemorrhage, or mass lesion is present. No significant extraaxial fluid collection is present. The ventricles are of normal size. Calvarium and skull base: Negative Paranasal sinuses: A polyp or mucous retention cyst is again noted within the left maxillary sinus. Orbits: The globes and orbits are intact. CTA NECK Aortic arch: Atherosclerotic calcifications are present at the origins the great vessels without significant stenosis. A 3 vessel arch configuration is present. Right carotid system: The right common carotid artery is within normal limits. Atherosclerotic calcifications are present at the right carotid bifurcation. There is no significant stenosis of greater than 50% relative to the more distal vessels Left carotid system: The left common carotid artery is within normal limits. Atherosclerotic irregularity and calcifications are present at the left carotid bifurcation. There is no significant stenosis relative to the more distal vessel. Vertebral arteries:The vertebral arteries both originate from the subclavian arteries. There are subclavian calcifications just proximal to both vertebral artery origins. There is mild narrowing on the left. The left is the dominant vessel. No tandem stenoses are present within either vertebral artery. The right PICA origin is below the dural margin. Skeleton: Solid anterior fusion is present at C3-4, C4-5, and C5-6. No focal lytic or blastic lesions are present. Alignment is normal in the cervical and upper thoracic spinal cord. Other neck: No focal mucosal or submucosal lesion is present. Salivary glands are normal. No significant adenopathy is  present. The thyroid is unremarkable. Mild dependent atelectasis is present in both lungs. CTA HEAD Anterior circulation: The cavernous internal carotid arteries are within normal limits bilaterally. The ICA termini are unremarkable. The A1 and M1 segments are within normal limits. The anterior communicating artery is patent. The MCA bifurcations are intact. There is mild irregularity of MCA and distal ACA branch vessels bilaterally this is more pronounced in the right MCA branch vessels than the left. Posterior circulation: Left vertebral artery is the dominant vessel. The left PICA origin is visualized and normal. The basilar artery is normal. Both posterior cerebral arteries originate from basilar tip. A right posterior communicating artery contributes. The proximal posterior cerebral arteries are within normal limits. There is some attenuation of distal vessels bilaterally. Venous sinuses: The dural sinuses are patent. The right transverse sinus is dominant. The straight sinus deep cerebral veins are intact. Cortical veins are unremarkable. Anatomic variants: With none Delayed phase: The postcontrast images demonstrate no pathologic enhancement. IMPRESSION: 1. Atherosclerotic changes at the carotid bifurcations bilaterally without significant stenosis on either side. 2. Mild diffuse small vessel disease, most prominent in the right MCA territory. 3. No significant proximal stenosis, aneurysm, or branch vessel occlusion within the circle of Willis. 4. Solid anterior cervical  fusion at C3-4, C4-5, and C5-6. Electronically Signed   By: San Morelle M.D.   On: 09/12/2015 13:36   Dg Chest 2 View  09/10/2015  CLINICAL DATA:  Left-sided chest pressure for 4 hours. EXAM: CHEST  2 VIEW COMPARISON:  10/04/2013 FINDINGS: The cardiomediastinal contours are normal. The lungs are clear. Pulmonary vasculature is normal. No consolidation, pleural effusion, or pneumothorax. No acute osseous abnormalities are seen.  Surgical hardware in the lower cervical spine, partially included and new from prior exam. IMPRESSION: No acute pulmonary process. Electronically Signed   By: Jeb Levering M.D.   On: 09/10/2015 19:12   Ct Head Wo Contrast  09/10/2015  CLINICAL DATA:  Headache during heated argument EXAM: CT HEAD WITHOUT CONTRAST TECHNIQUE: Contiguous axial images were obtained from the base of the skull through the vertex without intravenous contrast. 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 No acute intracranial abnormality. Specifically, no hemorrhage, hydrocephalus, mass lesion, acute infarction, or significant intracranial injury. No acute calvarial abnormality. Rounded soft tissue in the left maxillary sinus, likely mucous retention cyst or polyp, stable. Paranasal sinuses and mastoid air cells otherwise clear. IMPRESSION: No acute intracranial abnormality. Electronically Signed   By: Rolm Baptise M.D.   On: 09/10/2015 23:12   Ct Angio Neck W/cm &/or Wo/cm  09/12/2015  CLINICAL DATA:  Severe headaches since Saturday. EXAM: CT ANGIOGRAPHY HEAD AND NECK TECHNIQUE: Multidetector CT imaging of the head and neck was performed using the standard protocol during bolus administration of intravenous contrast. Multiplanar CT image reconstructions and MIPs were obtained to evaluate the vascular anatomy. Carotid stenosis measurements (when applicable) are obtained utilizing NASCET criteria, using the distal internal carotid diameter as the denominator. CONTRAST:  53mL OMNIPAQUE IOHEXOL 350 MG/ML SOLN COMPARISON:  None. CT head without contrast 09/10/2014. FINDINGS: CT HEAD Brain: Noncontrast imaging demonstrates no significant interval change. No acute infarct, hemorrhage, or mass lesion is present. No significant extraaxial fluid collection is present. The ventricles are of normal size. Calvarium and skull base: Negative Paranasal  sinuses: A polyp or mucous retention cyst is again noted within the left maxillary sinus. Orbits: The globes and orbits are intact. CTA NECK Aortic arch: Atherosclerotic calcifications are present at the origins the great vessels without significant stenosis. A 3 vessel arch configuration is present. Right carotid system: The right common carotid artery is within normal limits. Atherosclerotic calcifications are present at the right carotid bifurcation. There is no significant stenosis of greater than 50% relative to the more distal vessels Left carotid system: The left common carotid artery is within normal limits. Atherosclerotic irregularity and calcifications are present at the left carotid bifurcation. There is no significant stenosis relative to the more distal vessel. Vertebral arteries:The vertebral arteries both originate from the subclavian arteries. There are subclavian calcifications just proximal to both vertebral artery origins. There is mild narrowing on the left. The left is the dominant vessel. No tandem stenoses are present within either vertebral artery. The right PICA origin is below the dural margin. Skeleton: Solid anterior fusion is present at C3-4, C4-5, and C5-6. No focal lytic or blastic lesions are present. Alignment is normal in the cervical and upper thoracic spinal cord. Other neck: No focal mucosal or submucosal lesion is present. Salivary glands are normal. No significant adenopathy is present. The thyroid is unremarkable. Mild dependent atelectasis is present in both lungs. CTA HEAD Anterior circulation: The cavernous internal carotid arteries are  within normal limits bilaterally. The ICA termini are unremarkable. The A1 and M1 segments are within normal limits. The anterior communicating artery is patent. The MCA bifurcations are intact. There is mild irregularity of MCA and distal ACA branch vessels bilaterally this is more pronounced in the right MCA branch vessels than the left.  Posterior circulation: Left vertebral artery is the dominant vessel. The left PICA origin is visualized and normal. The basilar artery is normal. Both posterior cerebral arteries originate from basilar tip. A right posterior communicating artery contributes. The proximal posterior cerebral arteries are within normal limits. There is some attenuation of distal vessels bilaterally. Venous sinuses: The dural sinuses are patent. The right transverse sinus is dominant. The straight sinus deep cerebral veins are intact. Cortical veins are unremarkable. Anatomic variants: With none Delayed phase: The postcontrast images demonstrate no pathologic enhancement. IMPRESSION: 1. Atherosclerotic changes at the carotid bifurcations bilaterally without significant stenosis on either side. 2. Mild diffuse small vessel disease, most prominent in the right MCA territory. 3. No significant proximal stenosis, aneurysm, or branch vessel occlusion within the circle of Willis. 4. Solid anterior cervical fusion at C3-4, C4-5, and C5-6. Electronically Signed   By: San Morelle M.D.   On: 09/12/2015 13:36    Scheduled Meds: .  stroke: mapping our early stages of recovery book   Does not apply Once  . buPROPion  150 mg Oral Daily  . isosorbide mononitrate  30 mg Oral Daily  . PARoxetine  20 mg Oral Daily  . polyethylene glycol  17 g Oral Daily  . pregabalin  100 mg Oral TID  . rosuvastatin  20 mg Oral q1800   Continuous Infusions: . dextrose 5 % and 0.45 % NaCl with KCl 20 mEq/L Stopped (09/11/15 1738)    Time spent: > 35 minutes   Velvet Bathe  [facility] Pager [phone]. If 7PM-7AM, please contact night-coverage at [email or web address], password [facility] 09/12/2015, 4:57 PM  LOS: 1 day

## 2015-09-12 NOTE — Plan of Care (Signed)
Problem: Physical Regulation: Goal: Ability to maintain clinical measurements within normal limits will improve Outcome: Progressing Pt ambulated 2X today in hallway, independent, 500 ft.   Pain- medicated with tylenol and oxy, Pts head ache unchanged

## 2015-09-12 NOTE — Progress Notes (Signed)
  Echocardiogram 2D Echocardiogram has been performed.  Darlina Sicilian M 09/12/2015, 10:05 AM

## 2015-09-12 NOTE — Progress Notes (Signed)
Interval History:                                                                                                                      Melinda [name] is an 60 y.o. female patient with  New onset HA. Still having HA stating 10/10 pain, throbbing with heart beat but no weakness or numbness.    Past Medical History: Past Medical History  Diagnosis Date  . Degenerative disc disease   . Spinal stenosis   . Facet joint disease   . Bulging disc   . MRSA (methicillin resistant staph aureus) culture positive 2011    GREAT TOE RIGHT FOOT  . Fibromyalgia   . Peripheral neuropathy ([place])   . MCL deficiency, knee   . Coronary arteritis   . CAD (coronary artery disease) 2009    s/p stent to LAD  . Hyperlipidemia   . Heart disease   . 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 Chicken pox   . Anemia     years ago  . Sleep apnea     uses CPAP, sleep [facility] at [facility]  . Neuropathy [facility]) 04/18/2010    Past Surgical History  Procedure Laterality Date  . Foot surgery Right     BIG TOE  . Hand surgery Right   . Gallbladder surgery    . Ablation      UTERUS  . Ablation      HEART  . Heart stent  2009    LAD  . Hand surgey Left   . Foot surgery Bilateral     PLANTAR FASCIITIS  . Foot surgery Right     2ND TOE  . Infusion pump implantation      X2 with morphine and baclofen  . Eye surgery Bilateral   . Back surgery      X 3 1979, 1994, 1995  . Fracture surgery Right 2012    carpal tunnel  . Cholecystectomy  2003  . Anterior cervical decomp/discectomy fusion N/A 07/28/2014    Procedure: ANTERIOR CERVICAL DECOMPRESSION/DISCECTOMY FUSION CERVICAL 3-4,4-5,5-6 LEVELS WITH INSTRUMENTATION AND ALLOGRAFT;  Surgeon: Sinclair Ship, MD;  Location: [place];  Service: Orthopedics;  Laterality: N/A;  Anterior cervical decompression fusion, cervical 3-4, cervical 4-5, cervical 5-6 with instrumentation and allograft    Family History: Family History  Problem Relation  Age of Onset  . Cancer Mother     lung  . Diabetes Father   . Heart disease Maternal Grandfather   . Diabetes Paternal Grandmother   . Cancer Paternal Grandfather     colon  . Stroke Neg Hx     Social History:   reports that she quit smoking about 8 years ago. Her smoking use included Cigarettes. She has a 48 pack-year smoking history. She has never used smokeless tobacco. She reports that she drinks alcohol. She reports that she does not use illicit drugs.  Allergies:  No Known Allergies   Medications:  Current facility-administered medications:  .   stroke: mapping our early stages of recovery book, , Does not apply, Once, Ivor Costa, MD .  acetaminophen (TYLENOL) tablet 650 mg, 650 mg, Oral, Q4H PRN, Ivor Costa, MD, 650 mg at 09/12/15 1000 .  ALPRAZolam (XANAX) tablet 0.25 mg, 0.25 mg, Oral, BID PRN, Ivor Costa, MD .  buPROPion (WELLBUTRIN XL) 24 hr tablet 150 mg, 150 mg, Oral, Daily, Ivor Costa, MD, 150 mg at 09/12/15 0807 .  dextrose 5 % and 0.45 % NaCl with KCl 20 mEq/L infusion, , Intravenous, Continuous, Velvet Bathe, MD, Stopped at 09/11/15 1738 .  isosorbide mononitrate (IMDUR) 24 hr tablet 30 mg, 30 mg, Oral, Daily, Ivor Costa, MD, 30 mg at 09/12/15 0807 .  nitroGLYCERIN (NITROSTAT) SL tablet 0.4 mg, 0.4 mg, Sublingual, Q5 min PRN, Ivor Costa, MD .  ondansetron [facility]) injection 4 mg, 4 mg, Intravenous, Q6H PRN, Ivor Costa, MD .  oxyCODONE (Oxy IR/ROXICODONE) immediate release tablet 5 mg, 5 mg, Oral, Q8H PRN, Ivor Costa, MD, 5 mg at 09/12/15 0807 .  PARoxetine (PAXIL) tablet 20 mg, 20 mg, Oral, Daily, Ivor Costa, MD, 20 mg at 09/12/15 0807 .  polyethylene glycol (MIRALAX / GLYCOLAX) packet 17 g, 17 g, Oral, Daily, Ivor Costa, MD, 17 g at 09/11/15 1000 .  pregabalin (LYRICA) capsule 100 mg, 100 mg, Oral, TID, Ivor Costa, MD, 100 mg at 09/12/15 0806 .  rosuvastatin  (CRESTOR) tablet 20 mg, 20 mg, Oral, q1800, Ivor Costa, MD, 20 mg at 09/11/15 2158 .  tiZANidine (ZANAFLEX) tablet 4 mg, 4 mg, Oral, Q6H PRN, Ivor Costa, MD, 4 mg at 09/11/15 2158   Neurologic Examination:                                                                                                     Today's Vitals   09/12/15 0622 09/12/15 0800 09/12/15 0802 09/12/15 1000  BP:  120/64    Pulse:  91    Temp:   97.9 F (36.6 C)   TempSrc:   Oral   Resp:      Height:      Weight:      SpO2:  97%    PainSc: Asleep  4  4     Evaluation of higher integrative functions including: Level of alertness: Alert,  Oriented to time, place and person Speech: fluent, no evidence of dysarthria or aphasia noted.  Test the following cranial nerves: 2-12 grossly intact Motor examination: Normal tone, bulk, full 5/5 motor strength in all 4 extremities Examination of sensation : Normal and symmetric sensation to pinprick in all 4 extremities and on face Examination of deep tendon reflexes: 2+, normal and symmetric in all extremities, normal plantars bilaterally Test coordination: Normal finger nose testing, with no evidence of limb appendicular ataxia or abnormal involuntary movements or tremors noted.  Gait: Deferred   Lab Results: Basic Metabolic Panel:  Recent Labs Lab 09/10/15 1751  NA 140  K 4.6  CL 102  CO2 27  GLUCOSE 143*  BUN 25*  CREATININE 1.00  CALCIUM 9.1  Liver Function Tests: No results for input(s): AST, ALT, ALKPHOS, BILITOT, PROT, ALBUMIN in the last 168 hours. No results for input(s): LIPASE, AMYLASE in the last 168 hours. No results for input(s): AMMONIA in the last 168 hours.  CBC:  Recent Labs Lab 09/10/15 1751  WBC 9.6  HGB 12.7  HCT 38.9  MCV 86.6  PLT 206    Cardiac Enzymes:  Recent Labs Lab 09/11/15 0045 09/11/15 0420 09/11/15 1106 09/11/15 1555  TROPONINI <0.03 <0.03 <0.03 <0.03    Lipid Panel:  Recent Labs Lab 09/11/15 0421   CHOL 120  TRIG 86  HDL 41  CHOLHDL 2.9  VLDL 17  LDLCALC 62    CBG:  Recent Labs Lab 09/11/15 0808 09/11/15 1210 09/11/15 1706 09/11/15 2044 09/12/15 0800  GLUCAP 97 99 149* 134* 128*    Microbiology: Results for orders placed or performed during the hospital encounter of 09/11/15  CSF culture with Stat gram stain     Status: None (Preliminary result)   Collection Time: 09/11/15  6:28 PM  Result Value Ref Range Status   Specimen Description CSF  Final   Special Requests NONE  Final   Gram Stain   Final    CYTOSPIN SMEAR WBC PRESENT, PREDOMINANTLY PMN NO ORGANISMS SEEN    Culture NO GROWTH < 24 HOURS  Final   Report Status PENDING  Incomplete    Imaging: Dg Chest 2 View  09/10/2015  CLINICAL DATA:  Left-sided chest pressure for 4 hours. EXAM: CHEST  2 VIEW COMPARISON:  10/04/2013 FINDINGS: The cardiomediastinal contours are normal. The lungs are clear. Pulmonary vasculature is normal. No consolidation, pleural effusion, or pneumothorax. No acute osseous abnormalities are seen. Surgical hardware in the lower cervical spine, partially included and new from prior exam. IMPRESSION: No acute pulmonary process. Electronically Signed   By: Jeb Levering M.D.   On: 09/10/2015 19:12   Ct Head Wo Contrast  09/10/2015  CLINICAL DATA:  Headache during heated argument EXAM: CT HEAD WITHOUT CONTRAST TECHNIQUE: Contiguous axial images were obtained from the base of the skull through the vertex without intravenous contrast. 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 No acute intracranial abnormality. Specifically, no hemorrhage, hydrocephalus, mass lesion, acute infarction, or significant intracranial injury. No acute calvarial abnormality. Rounded soft tissue in the left maxillary sinus, likely mucous retention cyst or polyp, stable. Paranasal sinuses and mastoid air cells otherwise clear.  IMPRESSION: No acute intracranial abnormality. Electronically Signed   By: Rolm Baptise M.D.   On: 09/10/2015 23:12    Assessment and plan:   Melinda [name] is an 60 y.o. female patient with continued HA rated at 10/10. Will give one time compazine, toradol and benadryl now. CTA head and neck pending.   Seen with Dr. Silverio Decamp. Please see his attestation note for A/P for any additional work up recommendations.

## 2015-09-12 NOTE — Progress Notes (Signed)
UR Completed Kelsey Edman Graves-Bigelow, RN,BSN [phone]  

## 2015-09-13 LAB — GLUCOSE, CAPILLARY
Glucose-Capillary: 88 mg/dL (ref 65–99)
Glucose-Capillary: 99 mg/dL (ref 65–99)

## 2015-09-13 NOTE — Progress Notes (Signed)
Pt refusing to wear our Cpap.. Mask is not comfortable for pt.

## 2015-09-13 NOTE — Discharge Summary (Signed)
Physician Discharge Summary  Melinda [name]:[id number] DOB: [birth year] DOA: 09/11/2015  PCP: [name], NP  Admit date: 09/11/2015 Discharge date: 09/13/2015  Time spent: 45 minutes  Recommendations for Outpatient Follow-up:  1. PCP in 1 week 2. 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 female with PMH of hyperlipidemia, depression, CAD, S/P stent placement 2009, spinal stenosis, bulging disc, chronic back pain on baclofen plus fentanyl pump, OSA on CPAP, bilateral carotid artery stenosis, who presented with sudden onset of severe headache and chest pain. Patient reported that she started having sudden onset severe headache at about 3 PM on 3/12. The headache initially was located in the occipital area, which has progressively to have involved the top of her head and behind eyes, it was severe, 10 out of 10 in severity, sharp, nonradiating  Hospital Course:  Patient with chronic back pain followed by pain clinic as outpatient on baclofen and bupivacaine pain pump, on chronic opioid treatment, high-dose of Lyrica, prior history of cervical spine surgery,  followed by neurosurgery.  Was Admitted with intractable headache, improving with symptomatic treatment using Toradol and Compazine. CTA head and neck did not show any acute pathology.  Significant cervical chronic degenerative spinal canal stenosis with e/o prior fusion surgery noted on the CTA neck images.. -neurology was consulted, she underwent an LP to r/o SAH, she had 400RBC in CSF which per Neurology was due to traumatic tap and not high enough to be SAH. -Neurologist Dr.Nandigam discussed about various options with the patient including use of migraine prevention medications such as Depakote. The patient would like to be discharged home, and continue follow up with her regular pain physician. She is already on several pain and neuropsychiatric medications including opioids, tizanidine, baclofen pump, Lyrica and Xanax. There is a significantly increased risk of sedative side effects and encephalopathy with adding new neurological or pain medicines to her regimen. Hence, advised to continue with the current medications and follow-up with her regular outpatient physicians, she is much improved at the time of discharge and reporting that her headache is now 1/10 hence no changes made to her regimen. -she is advised to FU with her PCP and Neurosurgeon. -Rest of her medical problems were stable  Consultations: Neurology  Discharge Exam: Filed Vitals:   09/13/15 0330 09/13/15 0732  BP: 95/52 136/74  Pulse:  61  Temp: 97.6 F (36.4 C) 98.1 F (36.7 C)  Resp: 16 18    General: AAOx3 Cardiovascular: S1S2/RRR Respiratory: CTAB  Discharge Instructions   Discharge Instructions    Diet - low sodium heart healthy    Complete by:  As directed      Increase activity slowly    Complete by:  As directed           Discharge Medication List as of 09/13/2015 11:44 AM    CONTINUE these medications which have NOT CHANGED   Details  aspirin 81 MG tablet Take 81 mg by mouth daily., Until  Discontinued, Historical Med    buPROPion (WELLBUTRIN XL) 150 MG 24 hr tablet TAKE 1 TABLET BY MOUTH DAILY, Normal    isosorbide dinitrate (ISORDIL) 30 MG tablet TAKE 1 TABLET BY MOUTH DAILY, Normal    nitroGLYCERIN (NITROSTAT) 0.4 MG SL tablet Place 1 tablet (0.4 mg total) under the tongue every 5 (five) minutes as needed for chest pain., Starting 06/09/2014, Until Discontinued, Normal    oxyCODONE (OXY IR/ROXICODONE) 5 MG immediate release tablet Take 1 tablet (5 mg total) by mouth every 8 (eight) hours as needed for moderate pain or severe pain., Starting 07/27/2015, Until Discontinued, Print    PAIN MANAGEMENT IT PUMP REFILL 1 each by Intrathecal route once. Medication: PF Fentanyl 500.0 mcg/ml PF Baclofen 300.67mcg/ml PF Bupivicaine 20.0 mg/ml Total Volume: 40 ml Needed by 09-20-15 @ 1440, Starting 09/20/2015, Print    PARoxetine (PAXIL) 20 MG tablet TAKE ONE-HALF (1/2) OF A TABLET BY MOUTHTWICE DAILY, Normal    polyethylene glycol (MIRALAX / GLYCOLAX) packet Take 17 g by mouth daily., Until Discontinued, Historical Med    pregabalin (LYRICA) 100 MG capsule Take 1 capsule (100 mg total) by mouth 3 (three) times daily., Starting 07/27/2015, Until Sun 09/11/15, Print    rosuvastatin (CRESTOR) 20 MG tablet Take 1 tablet (20 mg total) by mouth daily., Starting 07/29/2015, Until Discontinued, Normal    tiZANidine (ZANAFLEX) 4 MG tablet Take 1 tablet (4 mg total) by mouth every 6 (six) hours as needed., Starting 07/27/2015, Until Discontinued, Normal    ACCU-CHEK AVIVA PLUS test strip TEST 2 TIMES DAY AS NEEDED **E11.9**, Normal    ACCU-CHEK SOFTCLIX LANCETS lancets 1 each by Other route 2 (two) times daily. Use as instructed, Starting 03/02/2015, Until Discontinued, Normal    AMBULATORY NON FORMULARY MEDICATION Medication Name: CPAP MASK OF CHOICE FOR HOME DEVICE, Print    Blood Glucose Calibration (ACCU-CHEK AVIVA) SOLN 1 each by In Vitro route as needed., Starting 01/21/2014, Until  Discontinued, Normal    Blood Glucose Monitoring Suppl (ACCU-CHEK AVIVA PLUS) W/DEVICE KIT 1 Device by Does not apply route once., Starting 01/21/2014, Normal       No Known Allergies Follow-up Information    Follow up with Nicki Reaper, NP. Go on 09/19/2015.   Specialty:  Internal Medicine   Why:  Follow up @ 2pm   Contact information:   [address] [place] [address] [phone]        The results of significant diagnostics from this hospitalization (including imaging, microbiology, ancillary and laboratory) are listed below for reference.    Significant Diagnostic Studies: Ct Angio Head W/cm &/or Wo Cm  09/12/2015  CLINICAL DATA:  Severe headaches since Saturday. EXAM: CT ANGIOGRAPHY HEAD AND NECK TECHNIQUE: Multidetector CT imaging of the head and neck was performed using the standard protocol during bolus administration of intravenous contrast. Multiplanar CT image reconstructions and MIPs were obtained to evaluate the vascular anatomy. Carotid stenosis measurements (when applicable) are obtained utilizing NASCET criteria, using the distal internal carotid diameter as the denominator. CONTRAST:  69mL OMNIPAQUE IOHEXOL 350 MG/ML SOLN COMPARISON:  None. CT head without contrast 09/10/2014. FINDINGS: CT HEAD Brain:  Noncontrast imaging demonstrates no significant interval change. No acute infarct, hemorrhage, or mass lesion is present. No significant extraaxial fluid collection is present. The ventricles are of normal size. Calvarium and skull base: Negative Paranasal sinuses: A polyp or mucous retention cyst is again noted within the left maxillary sinus. Orbits: The globes and orbits are intact. CTA NECK Aortic arch: Atherosclerotic calcifications are present at the origins the great vessels without significant stenosis. A 3 vessel arch configuration is present. Right carotid system: The right common carotid artery is within normal limits. Atherosclerotic calcifications are  present at the right carotid bifurcation. There is no significant stenosis of greater than 50% relative to the more distal vessels Left carotid system: The left common carotid artery is within normal limits. Atherosclerotic irregularity and calcifications are present at the left carotid bifurcation. There is no significant stenosis relative to the more distal vessel. Vertebral arteries:The vertebral arteries both originate from the subclavian arteries. There are subclavian calcifications just proximal to both vertebral artery origins. There is mild narrowing on the left. The left is the dominant vessel. No tandem stenoses are present within either vertebral artery. The right PICA origin is below the dural margin. Skeleton: Solid anterior fusion is present at C3-4, C4-5, and C5-6. No focal lytic or blastic lesions are present. Alignment is normal in the cervical and upper thoracic spinal cord. Other neck: No focal mucosal or submucosal lesion is present. Salivary glands are normal. No significant adenopathy is present. The thyroid is unremarkable. Mild dependent atelectasis is present in both lungs. CTA HEAD Anterior circulation: The cavernous internal carotid arteries are within normal limits bilaterally. The ICA termini are unremarkable. The A1 and M1 segments are within normal limits. The anterior communicating artery is patent. The MCA bifurcations are intact. There is mild irregularity of MCA and distal ACA branch vessels bilaterally this is more pronounced in the right MCA branch vessels than the left. Posterior circulation: Left vertebral artery is the dominant vessel. The left PICA origin is visualized and normal. The basilar artery is normal. Both posterior cerebral arteries originate from basilar tip. A right posterior communicating artery contributes. The proximal posterior cerebral arteries are within normal limits. There is some attenuation of distal vessels bilaterally. Venous sinuses: The dural sinuses  are patent. The right transverse sinus is dominant. The straight sinus deep cerebral veins are intact. Cortical veins are unremarkable. Anatomic variants: With none Delayed phase: The postcontrast images demonstrate no pathologic enhancement. IMPRESSION: 1. Atherosclerotic changes at the carotid bifurcations bilaterally without significant stenosis on either side. 2. Mild diffuse small vessel disease, most prominent in the right MCA territory. 3. No significant proximal stenosis, aneurysm, or branch vessel occlusion within the circle of Willis. 4. Solid anterior cervical fusion at C3-4, C4-5, and C5-6. Electronically Signed   By: San Morelle M.D.   On: 09/12/2015 13:36   Dg Chest 2 View  09/10/2015  CLINICAL DATA:  Left-sided chest pressure for 4 hours. EXAM: CHEST  2 VIEW COMPARISON:  10/04/2013 FINDINGS: The cardiomediastinal contours are normal. The lungs are clear. Pulmonary vasculature is normal. No consolidation, pleural effusion, or pneumothorax. No acute osseous abnormalities are seen. Surgical hardware in the lower cervical spine, partially included and new from prior exam. IMPRESSION: No acute pulmonary process. Electronically Signed   By: Jeb Levering M.D.   On: 09/10/2015 19:12   Ct Head Wo Contrast  09/10/2015  CLINICAL DATA:  Headache during heated argument EXAM: CT HEAD WITHOUT CONTRAST TECHNIQUE: Contiguous axial images were obtained  from the base of the skull through the vertex without intravenous contrast. 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 No acute intracranial abnormality. Specifically, no hemorrhage, hydrocephalus, mass lesion, acute infarction, or significant intracranial injury. No acute calvarial abnormality. Rounded soft tissue in the left maxillary sinus, likely mucous retention cyst or polyp, stable. Paranasal sinuses and mastoid air cells otherwise clear. IMPRESSION: No  acute intracranial abnormality. Electronically Signed   By: Charlett Nose M.D.   On: 09/10/2015 23:12   Ct Angio Neck W/cm &/or Wo/cm  09/12/2015  CLINICAL DATA:  Severe headaches since Saturday. EXAM: CT ANGIOGRAPHY HEAD AND NECK TECHNIQUE: Multidetector CT imaging of the head and neck was performed using the standard protocol during bolus administration of intravenous contrast. Multiplanar CT image reconstructions and MIPs were obtained to evaluate the vascular anatomy. Carotid stenosis measurements (when applicable) are obtained utilizing NASCET criteria, using the distal internal carotid diameter as the denominator. CONTRAST:  70mL OMNIPAQUE IOHEXOL 350 MG/ML SOLN COMPARISON:  None. CT head without contrast 09/10/2014. FINDINGS: CT HEAD Brain: Noncontrast imaging demonstrates no significant interval change. No acute infarct, hemorrhage, or mass lesion is present. No significant extraaxial fluid collection is present. The ventricles are of normal size. Calvarium and skull base: Negative Paranasal sinuses: A polyp or mucous retention cyst is again noted within the left maxillary sinus. Orbits: The globes and orbits are intact. CTA NECK Aortic arch: Atherosclerotic calcifications are present at the origins the great vessels without significant stenosis. A 3 vessel arch configuration is present. Right carotid system: The right common carotid artery is within normal limits. Atherosclerotic calcifications are present at the right carotid bifurcation. There is no significant stenosis of greater than 50% relative to the more distal vessels Left carotid system: The left common carotid artery is within normal limits. Atherosclerotic irregularity and calcifications are present at the left carotid bifurcation. There is no significant stenosis relative to the more distal vessel. Vertebral arteries:The vertebral arteries both originate from the subclavian arteries. There are subclavian calcifications just proximal to both  vertebral artery origins. There is mild narrowing on the left. The left is the dominant vessel. No tandem stenoses are present within either vertebral artery. The right PICA origin is below the dural margin. Skeleton: Solid anterior fusion is present at C3-4, C4-5, and C5-6. No focal lytic or blastic lesions are present. Alignment is normal in the cervical and upper thoracic spinal cord. Other neck: No focal mucosal or submucosal lesion is present. Salivary glands are normal. No significant adenopathy is present. The thyroid is unremarkable. Mild dependent atelectasis is present in both lungs. CTA HEAD Anterior circulation: The cavernous internal carotid arteries are within normal limits bilaterally. The ICA termini are unremarkable. The A1 and M1 segments are within normal limits. The anterior communicating artery is patent. The MCA bifurcations are intact. There is mild irregularity of MCA and distal ACA branch vessels bilaterally this is more pronounced in the right MCA branch vessels than the left. Posterior circulation: Left vertebral artery is the dominant vessel. The left PICA origin is visualized and normal. The basilar artery is normal. Both posterior cerebral arteries originate from basilar tip. A right posterior communicating artery contributes. The proximal posterior cerebral arteries are within normal limits. There is some attenuation of distal vessels bilaterally. Venous sinuses: The dural sinuses are patent. The right transverse sinus is dominant. The straight sinus deep cerebral veins are intact. Cortical veins are unremarkable.  Anatomic variants: With none Delayed phase: The postcontrast images demonstrate no pathologic enhancement. IMPRESSION: 1. Atherosclerotic changes at the carotid bifurcations bilaterally without significant stenosis on either side. 2. Mild diffuse small vessel disease, most prominent in the right MCA territory. 3. No significant proximal stenosis, aneurysm, or branch vessel  occlusion within the circle of Willis. 4. Solid anterior cervical fusion at C3-4, C4-5, and C5-6. Electronically Signed   By: San Morelle M.D.   On: 09/12/2015 13:36   Mr Knee Right Wo Contrast  08/29/2015  CLINICAL DATA:  Chronic, diffuse right knee pain and instability. Remote history of prior surgery. Initial encounter. EXAM: MRI OF THE RIGHT KNEE WITHOUT CONTRAST TECHNIQUE: Multiplanar, multisequence MR imaging of the knee was performed. No intravenous contrast was administered. COMPARISON:  MRI right knee 06/30/2013. FINDINGS: MENISCI Medial meniscus: Horizontal tear in the posterior horn reaches the femoral articular surface. The body is degenerated and extruded peripherally. The appearance is not markedly changed. Lateral meniscus: The patient has a new complete radial tear through the root of the posterior horn of the lateral meniscus. The body of the lateral meniscus is extruded peripherally with extensive fraying along its free edge. There is a horizontal tear near the junction the posterior horn and body reaching the femoral articular surface as seen on the prior exam. LIGAMENTS Cruciates:  Intact. Collaterals:  Intact. CARTILAGE Patellofemoral: Cartilage loss is most notable along the medial patellar facet and not notably changed. Medial:  Thinned and irregular throughout, unchanged Lateral:  Thinned and irregular throughout, unchanged. Joint:  Moderate joint effusion is present. Popliteal Fossa:  No Baker's cyst. Extensor Mechanism:  Intact. Bones: No fracture or worrisome marrow lesion. Osteophytosis about the knee is seen and shows some progression in the patellofemoral compartment. IMPRESSION: New complete radial tear through the root of the posterior horn of the lateral meniscus. Horizontal tear in the posterior horn reaching the femoral articular surface is seen as on the prior study. No marked change in a horizontal tear reaching the femoral articular surface in the posterior horn of  the medial meniscus. 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 Electronically Signed   By: Inge Rise M.D.   On: 08/29/2015 11:04    Microbiology: Recent Results (from the past 240 hour(s))  CSF culture with Stat gram stain     Status: None (Preliminary result)   Collection Time: 09/11/15  6:28 PM  Result Value Ref Range Status   Specimen Description CSF  Final   Special Requests NONE  Final   Gram Stain   Final    CYTOSPIN SMEAR WBC PRESENT, PREDOMINANTLY PMN NO ORGANISMS SEEN    Culture NO GROWTH 2 DAYS  Final   Report Status PENDING  Incomplete     Labs: Basic Metabolic Panel:  Recent Labs Lab 09/10/15 1751  NA 140  K 4.6  CL 102  CO2 27  GLUCOSE 143*  BUN 25*  CREATININE 1.00  CALCIUM 9.1   Liver Function Tests: No results for input(s): AST, ALT, ALKPHOS, BILITOT, PROT, ALBUMIN in the last 168 hours. No results for input(s): LIPASE, AMYLASE in the last 168 hours. No results for input(s): AMMONIA in the last 168 hours. CBC:  Recent Labs Lab 09/10/15 1751  WBC 9.6  HGB 12.7  HCT 38.9  MCV 86.6  PLT 206   Cardiac Enzymes:  Recent Labs Lab 09/11/15 0045 09/11/15 0420 09/11/15 1106 09/11/15 1555  TROPONINI <0.03 <0.03 <0.03 <0.03   BNP: BNP (last 3 results)  No results for input(s): BNP in the last 8760 hours.  ProBNP (last 3 results) No results for input(s): PROBNP in the last 8760 hours.  CBG:  Recent Labs Lab 09/12/15 1145 09/12/15 1713 09/12/15 2111 09/13/15 0735 09/13/15 1153  GLUCAP 90 84 113* 88 99       Signed:  Wendell Nicoson MD.  [facility] 09/13/2015, 6:00 PM

## 2015-09-13 NOTE — Discharge Instructions (Signed)
Heart-Healthy Eating Plan Many factors influence your heart health, including eating and exercise habits. Heart (coronary) risk increases with abnormal blood fat (lipid) levels. Heart-healthy meal planning includes limiting unhealthy fats, increasing healthy fats, and making other small dietary changes. This includes maintaining a healthy body weight to help keep lipid levels within a normal range. WHAT IS MY PLAN?  Your health care provider recommends that you:  Get no more than _________% of the total calories in your daily diet from fat.  Limit your intake of saturated fat to less than _________% of your total calories each day.  Limit the amount of cholesterol in your diet to less than _________ mg per day. WHAT TYPES OF FAT SHOULD I CHOOSE?  Choose healthy fats more often. Choose monounsaturated and polyunsaturated fats, such as olive oil and canola oil, flaxseeds, walnuts, almonds, and seeds.  Eat more omega-3 fats. Good choices include salmon, mackerel, sardines, tuna, flaxseed oil, and ground flaxseeds. Aim to eat fish at least two times each week.  Limit saturated fats. Saturated fats are primarily found in animal products, such as meats, butter, and cream. Plant sources of saturated fats include palm oil, palm kernel oil, and coconut oil.  Avoid foods with partially hydrogenated oils in them. These contain trans fats. Examples of foods that contain trans fats are stick margarine, some tub margarines, cookies, crackers, and other baked goods. WHAT GENERAL GUIDELINES DO I NEED TO FOLLOW?  Check food labels carefully to identify foods with trans fats or high amounts of saturated fat.  Fill one half of your plate with vegetables and green salads. Eat 4-5 servings of vegetables per day. A serving of vegetables equals 1 cup of raw leafy vegetables,  cup of raw or cooked cut-up vegetables, or  cup of vegetable juice.  Fill one fourth of your plate with whole grains. Look for the word  "whole" as the first word in the ingredient list.  Fill one fourth of your plate with lean protein foods.  Eat 4-5 servings of fruit per day. A serving of fruit equals one medium whole fruit,  cup of dried fruit,  cup of fresh, frozen, or canned fruit, or  cup of 100% fruit juice.  Eat more foods that contain soluble fiber. Examples of foods that contain this type of fiber are apples, broccoli, carrots, beans, peas, and barley. Aim to get 20-30 g of fiber per day.  Eat more home-cooked food and less restaurant, buffet, and fast food.  Limit or avoid alcohol.  Limit foods that are high in starch and sugar.  Avoid fried foods.  Cook foods by using methods other than frying. Baking, boiling, grilling, and broiling are all great options. Other fat-reducing suggestions include:  Removing the skin from poultry.  Removing all visible fats from meats.  Skimming the fat off of stews, soups, and gravies before serving them.  Steaming vegetables in water or broth.  Lose weight if you are overweight. Losing just 5-10% of your initial body weight can help your overall health and prevent diseases such as diabetes and heart disease.  Increase your consumption of nuts, legumes, and seeds to 4-5 servings per week. One serving of dried beans or legumes equals  cup after being cooked, one serving of nuts equals 1 ounces, and one serving of seeds equals  ounce or 1 tablespoon.  You may need to monitor your salt (sodium) intake, especially if you have high blood pressure. Talk with your health care provider or dietitian to get  more information about reducing sodium. °WHAT FOODS CAN I EAT? °Grains °Breads, including French, white, pita, wheat, raisin, rye, oatmeal, and Italian. Tortillas that are neither fried nor made with lard or trans fat. Low-fat rolls, including hotdog and hamburger buns and English muffins. Biscuits. Muffins. Waffles. Pancakes. Light popcorn. Whole-grain cereals. Flatbread. Melba  toast. Pretzels. Breadsticks. Rusks. Low-fat snacks and crackers, including oyster, saltine, matzo, graham, animal, and rye. Rice and pasta, including brown rice and those that are made with whole wheat. °Vegetables °All vegetables. °Fruits °All fruits, but limit coconut. °Meats and Other Protein Sources °Lean, well-trimmed beef, veal, pork, and lamb. Chicken and turkey without skin. All fish and shellfish. Wild duck, rabbit, pheasant, and venison. Egg whites or low-cholesterol egg substitutes. Dried beans, peas, lentils, and tofu. Seeds and most nuts. °Dairy °Low-fat or nonfat cheeses, including ricotta, string, and mozzarella. Skim or 1% milk that is liquid, powdered, or evaporated. Buttermilk that is made with low-fat milk. Nonfat or low-fat yogurt. °Beverages °Mineral water. Diet carbonated beverages. °Sweets and Desserts °Sherbets and fruit ices. Honey, jam, marmalade, jelly, and syrups. Meringues and gelatins. Pure sugar candy, such as hard candy, jelly beans, gumdrops, mints, marshmallows, and small amounts of dark chocolate. Angel food cake. °Eat all sweets and desserts in moderation. °Fats and Oils °Nonhydrogenated (trans-free) margarines. Vegetable oils, including soybean, sesame, sunflower, olive, peanut, safflower, corn, canola, and cottonseed. Salad dressings or mayonnaise that are made with a vegetable oil. Limit added fats and oils that you use for cooking, baking, salads, and as spreads. °Other °Cocoa powder. Coffee and tea. All seasonings and condiments. °The items listed above may not be a complete list of recommended foods or beverages. Contact your dietitian for more options. °WHAT FOODS ARE NOT RECOMMENDED? °Grains °Breads that are made with saturated or trans fats, oils, or whole milk. Croissants. Butter rolls. Cheese breads. Sweet rolls. Donuts. Buttered popcorn. Chow mein noodles. High-fat crackers, such as cheese or butter crackers. °Meats and Other Protein Sources °Fatty meats, such as  hotdogs, short ribs, sausage, spareribs, bacon, ribeye roast or steak, and mutton. High-fat deli meats, such as salami and bologna. Caviar. Domestic duck and goose. Organ meats, such as kidney, liver, sweetbreads, brains, gizzard, chitterlings, and heart. °Dairy °Cream, sour cream, cream cheese, and creamed cottage cheese. Whole milk cheeses, including blue (bleu), Monterey Jack, Brie, [place], American, Havarti, Swiss, cheddar, Camembert, and [place].  Whole or 2% milk that is liquid, evaporated, or condensed. Whole buttermilk. Cream sauce or high-fat cheese sauce. Yogurt that is made from whole milk. °Beverages °Regular sodas and drinks with added sugar. °Sweets and Desserts °Frosting. Pudding. Cookies. Cakes other than angel food cake. Candy that has milk chocolate or white chocolate, hydrogenated fat, butter, coconut, or unknown ingredients. Buttered syrups. Full-fat ice cream or ice cream drinks. °Fats and Oils °Gravy that has suet, meat fat, or shortening. Cocoa butter, hydrogenated oils, palm oil, coconut oil, palm kernel oil. These can often be found in baked products, candy, fried foods, nondairy creamers, and whipped toppings. Solid fats and shortenings, including bacon fat, salt pork, lard, and butter. Nondairy cream substitutes, such as coffee creamers and sour cream substitutes. Salad dressings that are made of unknown oils, cheese, or sour cream. °The items listed above may not be a complete list of foods and beverages to avoid. Contact your dietitian for more information. °  °This information is not intended to replace advice given to you by your health care provider. Make sure you discuss any questions you have with your health   care provider.   Document Released: 03/27/2008 Document Revised: 07/09/2014 Document Reviewed: 12/10/2013 Elsevier Interactive Patient Education 2016 [place] Headache Without Cause A headache is pain or discomfort felt around the head or neck area. The  specific cause of a headache may not be found. There are many causes and types of headaches. A few common ones are:  Tension headaches.  Migraine headaches.  Cluster headaches.  Chronic daily headaches. HOME CARE INSTRUCTIONS  Watch your condition for any changes. Take these steps to help with your condition: Managing Pain  Take over-the-counter and prescription medicines only as told by your health care provider.  Lie down in a dark, quiet room when you have a headache.  If directed, apply ice to the head and neck area:  Put ice in a plastic bag.  Place a towel between your skin and the bag.  Leave the ice on for 20 minutes, 2-3 times per day.  Use a heating pad or hot shower to apply heat to the head and neck area as told by your health care provider.  Keep lights dim if bright lights bother you or make your headaches worse. Eating and Drinking  Eat meals on a regular schedule.  Limit alcohol use.  Decrease the amount of caffeine you drink, or stop drinking caffeine. General Instructions  Keep all follow-up visits as told by your health care provider. This is important.  Keep a headache journal to help find out what may trigger your headaches. For example, write down:  What you eat and drink.  How much sleep you get.  Any change to your diet or medicines.  Try massage or other relaxation techniques.  Limit stress.  Sit up straight, and do not tense your muscles.  Do not use tobacco products, including cigarettes, chewing tobacco, or e-cigarettes. If you need help quitting, ask your health care provider.  Exercise regularly as told by your health care provider.  Sleep on a regular schedule. Get 7-9 hours of sleep, or the amount recommended by your health care provider. SEEK MEDICAL CARE IF:   Your symptoms are not helped by medicine.  You have a headache that is different from the usual headache.  You have nausea or you vomit.  You have a  fever. SEEK IMMEDIATE MEDICAL CARE IF:   Your headache becomes severe.  You have repeated vomiting.  You have a stiff neck.  You have a loss of vision.  You have problems with speech.  You have pain in the eye or ear.  You have muscular weakness or loss of muscle control.  You lose your balance or have trouble walking.  You feel faint or pass out.  You have confusion.   This information is not intended to replace advice given to you by your health care provider. Make sure you discuss any questions you have with your health care provider.   Document Released: 06/18/2005 Document Revised: 03/09/2015 Document Reviewed: 10/11/2014 Elsevier Interactive Patient Education [employer].

## 2015-09-13 NOTE — Care Management Note (Signed)
Case Management Note  Patient Details  Name: Melinda [name] MRN: [id number] Date of Birth: [birth year]  Subjective/Objective:   Pt admitted for Severe Headache.                 Action/Plan: No needs identified by CM at this time. Will continue to monitor.   Expected Discharge Date:                  Expected Discharge Plan:  Home/Self Care  In-House Referral:  NA  Discharge planning Services  CM Consult  Post Acute Care Choice:  NA Choice offered to:  NA  DME Arranged:  N/A DME Agency:  NA  HH Arranged:  NA HH Agency:  NA  Status of Service:  Completed, signed off  Medicare Important Message Given:    Date Medicare IM Given:    Medicare IM give by:    Date Additional Medicare IM Given:    Additional Medicare Important Message give by:     If discussed at [place] of Stay Meetings, dates discussed:    Additional Comments:  Bethena Roys, RN 09/13/2015, 10:12 AM

## 2015-09-14 ENCOUNTER — Telehealth: Payer: Self-pay | Admitting: *Deleted

## 2015-09-14 NOTE — Telephone Encounter (Signed)
noted 

## 2015-09-14 NOTE — Telephone Encounter (Signed)
Transition Care Management Follow-up Telephone Call   Date discharged? 09/13/15   How have you been since you were released from the hospital? Doing well, no headache since yesterday.   Do you understand why you were in the hospital? yes   Do you understand the discharge instructions? yes   Where were you discharged to? home   Items Reviewed:  Medications reviewed: yes  Allergies reviewed: yes  Dietary changes reviewed: no  Referrals reviewed: neurosurgery   Functional Questionnaire:   Activities of Daily Living (ADLs):   She states they are independent in the following: ambulation, bathing and hygiene, feeding, continence, grooming, toileting and dressing States they require assistance with the following: none   Any transportation issues/concerns?: no   Any patient concerns? no   Confirmed importance and date/time of follow-up visits scheduled yes, 09/23/15 @ 1330  Provider Appointment booked with [name], NP  Confirmed with patient if condition begins to worsen call PCP or go to the ER.  Patient was given the office number and encouraged to call back with question or concerns.  : yes

## 2015-09-15 LAB — CSF CULTURE W GRAM STAIN: Culture: NO GROWTH

## 2015-09-19 ENCOUNTER — Ambulatory Visit: Payer: Medicare Other | Admitting: Internal Medicine

## 2015-09-20 ENCOUNTER — Encounter: Payer: Self-pay | Admitting: Pain Medicine

## 2015-09-20 ENCOUNTER — Ambulatory Visit: Payer: Medicare Other | Attending: Pain Medicine | Admitting: Pain Medicine

## 2015-09-20 VITALS — BP 118/70 | HR 79 | Temp 98.6°F | Resp 16 | Ht 69.0 in | Wt 220.0 lb

## 2015-09-20 DIAGNOSIS — Z6830 Body mass index (BMI) 30.0-30.9, adult: Secondary | ICD-10-CM | POA: Diagnosis not present

## 2015-09-20 DIAGNOSIS — I2510 Atherosclerotic heart disease of native coronary artery without angina pectoris: Secondary | ICD-10-CM | POA: Insufficient documentation

## 2015-09-20 DIAGNOSIS — M179 Osteoarthritis of knee, unspecified: Secondary | ICD-10-CM | POA: Diagnosis not present

## 2015-09-20 DIAGNOSIS — M549 Dorsalgia, unspecified: Secondary | ICD-10-CM | POA: Diagnosis present

## 2015-09-20 DIAGNOSIS — Z9889 Other specified postprocedural states: Secondary | ICD-10-CM | POA: Diagnosis not present

## 2015-09-20 DIAGNOSIS — G8929 Other chronic pain: Secondary | ICD-10-CM | POA: Diagnosis not present

## 2015-09-20 DIAGNOSIS — K5903 Drug induced constipation: Secondary | ICD-10-CM | POA: Diagnosis not present

## 2015-09-20 DIAGNOSIS — M545 Low back pain, unspecified: Secondary | ICD-10-CM

## 2015-09-20 DIAGNOSIS — M79605 Pain in left leg: Secondary | ICD-10-CM | POA: Diagnosis not present

## 2015-09-20 DIAGNOSIS — Z981 Arthrodesis status: Secondary | ICD-10-CM | POA: Insufficient documentation

## 2015-09-20 DIAGNOSIS — R51 Headache: Secondary | ICD-10-CM | POA: Diagnosis not present

## 2015-09-20 DIAGNOSIS — M797 Fibromyalgia: Secondary | ICD-10-CM | POA: Insufficient documentation

## 2015-09-20 DIAGNOSIS — G894 Chronic pain syndrome: Secondary | ICD-10-CM | POA: Insufficient documentation

## 2015-09-20 DIAGNOSIS — Z79891 Long term (current) use of opiate analgesic: Secondary | ICD-10-CM | POA: Insufficient documentation

## 2015-09-20 DIAGNOSIS — Z969 Presence of functional implant, unspecified: Secondary | ICD-10-CM

## 2015-09-20 DIAGNOSIS — Z9689 Presence of other specified functional implants: Secondary | ICD-10-CM | POA: Diagnosis not present

## 2015-09-20 DIAGNOSIS — E119 Type 2 diabetes mellitus without complications: Secondary | ICD-10-CM | POA: Insufficient documentation

## 2015-09-20 DIAGNOSIS — E7800 Pure hypercholesterolemia, unspecified: Secondary | ICD-10-CM | POA: Diagnosis not present

## 2015-09-20 DIAGNOSIS — M47816 Spondylosis without myelopathy or radiculopathy, lumbar region: Secondary | ICD-10-CM | POA: Diagnosis not present

## 2015-09-20 DIAGNOSIS — F329 Major depressive disorder, single episode, unspecified: Secondary | ICD-10-CM | POA: Insufficient documentation

## 2015-09-20 DIAGNOSIS — Z451 Encounter for adjustment and management of infusion pump: Secondary | ICD-10-CM

## 2015-09-20 DIAGNOSIS — E669 Obesity, unspecified: Secondary | ICD-10-CM | POA: Diagnosis not present

## 2015-09-20 DIAGNOSIS — R079 Chest pain, unspecified: Secondary | ICD-10-CM | POA: Insufficient documentation

## 2015-09-20 DIAGNOSIS — M25569 Pain in unspecified knee: Secondary | ICD-10-CM | POA: Diagnosis present

## 2015-09-20 NOTE — Progress Notes (Signed)
Safety precautions to be maintained throughout the outpatient stay will include: orient to surroundings, keep bed in low position, maintain call bell within reach at all times, provide assistance with transfer out of bed and ambulation.  Patient  Hospitalized on September 10, 2015 and discharged on Tuesday September 13, 2015 with sudden onset with horrendous headache, elevated blood pressure:also, patient had an MRI done  Of the right knee and fluid taken off the knee, twice.  Pill count  Oxycodone HCL  5 mg tablets  49 count/90 count date  Filled  08/30/15

## 2015-09-20 NOTE — Patient Instructions (Signed)
Narcotic Overdose °A narcotic overdose is the misuse or overuse of a narcotic drug. A narcotic overdose can make you pass out and stop breathing. If you are not treated right away, this can cause permanent brain damage or stop your heart. Medicine may be given to reverse the effects of an overdose. If so, this medicine may bring on withdrawal symptoms. The symptoms may be abdominal cramps, throwing up (vomiting), sweating, chills, and nervousness. °Injecting narcotics can cause more problems than just an overdose. AIDS, hepatitis, and other very serious infections are transmitted by sharing needles and syringes. If you decide to quit using, there are medicines which can help you through the withdrawal period. Trying to quit all at once on your own can be uncomfortable, but not life-threatening. Call your caregiver, Narcotics Anonymous, or any drug and alcohol treatment program for further help.  °  °This information is not intended to replace advice given to you by your health care provider. Make sure you discuss any questions you have with your health care provider. °  °Document Released: 07/26/2004 Document Revised: 07/09/2014 Document Reviewed: 12/08/2014 °Elsevier Interactive Patient Education ©2016 [employer] ° °

## 2015-09-20 NOTE — Progress Notes (Signed)
Patient brought in her deceased fathers medications.  Wasted in toilet Hydrocodone #2 and Tylenol #3 80 tablets wasted witnessed by D. Donneta Romberg RN and patient.

## 2015-09-20 NOTE — Progress Notes (Signed)
Patient's Name: Melinda [name] MRN: [id number] DOB: 30-Apr-1956 DOS: 09/20/2015  Primary Reason(s) for Visit: Intrathecal Pump Refill. CC: Knee Pain and Back Pain   Procedure  Type: Palliative Intrathecal Pump Refill by Nursing Staff Region: Lower Abdominal Area Laterality: Right-Sided  Indications: Chronic Pain  Pre-Procedure Assessment:  [name] is a 60 y.o. year old, female patient, seen today for interventional treatment. She has Chronic pain syndrome; CAD (coronary artery disease); Obesity (BMI 30-39.9); Anxiety and depression; Angina decubitus ([place]); Pure hypercholesterolemia; DM2 (diabetes mellitus, type 2) ([place]); Great toe amputation status ([place]); Personal history of spine surgery; Failed back surgical syndrome; Failed cervical surgery syndrome (ACDF); Chronic pain; Chronic neck pain; Chronic low back pain (Bilateral) (L>R); Lumbar spondylosis; Epidural fibrosis; Cervical spondylosis; Cervical facet syndrome; Chronic lower extremity pain (Left); Chronic lumbar radicular pain (Left); Iliotibial band syndrome (Left); Diffuse myofascial pain syndrome; Musculoskeletal pain; Muscle spasms of lower extremities (Bilateral); Muscle spasticity; Neurogenic pain; Neuropathic pain; Lumbar facet syndrome (Bilateral); Fibromyalgia; Long term current use of opiate analgesic; Long term prescription opiate use; Opiate use; Opioid dependence ([place]); Encounter for therapeutic drug level monitoring; Encounter for chronic pain management; Presence of functional implant (Medtronic programmable intrathecal pump) (Right abdominal area); Presence of intrathecal pump (Medtronic intrathecal programmable pump) (40 mL pump); Encounter for interrogation of infusion pump; Encounter for adjustment or management of infusion pump; Therapeutic opioid-induced constipation (OIC); Chronic knee pain (Left); Osteoarthritis of knee (Left); Sudden onset of severe headache; Chest pain; Headache; Depression; HLD (hyperlipidemia); Pain  in the chest; and Cephalalgia on her problem list.. Her primarily concern today is the Knee Pain and Back Pain   Today's Initial Pain Score: 4/10 Reported level of pain is compatible with clinical observation Pain Type: Chronic pain Pain Location: Knee Pain Orientation: Right Pain Descriptors / Indicators: Aching, Constant (when twisting or using stairs the pain is more severe; Patient states "I have a new tear in the knee") Pain Frequency: Constant  Post-procedure Pain Score: 4   Date of Last Visit: 07/27/15 Service Provided on Last Visit: Med Refill  Verification of the correct person, correct site (including marking of site), and correct procedure were performed and confirmed by the patient.  Today's Vitals   09/20/15 1355 09/20/15 1357  BP: 118/70   Pulse: 79   Temp: 98.6 F (37 C)   TempSrc: Oral   Resp: 16   Height: '5\' 9"'$  (1.753 m)   Weight: 220 lb (99.791 kg)   SpO2: 94%   PainSc: 4  4   PainLoc: Knee   Calculated BMI: Body mass index is 32.47 kg/(m^2). Allergies: She has No Known Allergies.. Primary Diagnosis: Chronic pain syndrome [G89.4]  Consent: Secured. Under the influence of no sedatives a written informed consent was obtained, after having provided information on the risks and possible complications. To fulfill our ethical and legal obligations, as recommended by the American Medical Association's Code of Ethics, we have provided information to the patient about our clinical impression; the nature and purpose of the treatment or procedure; the risks, benefits, and possible complications of the intervention; alternatives; the risk(s) and benefit(s) of the alternative treatment(s) or procedure(s); and the risk(s) and benefit(s) of doing nothing. The patient was provided information about the risks and possible complications associated with the procedure. These include, but are not limited to, failure to achieve desired goals, infection, bleeding, organ or nerve  damage, allergic reactions, paralysis, and death. In addition, the patient was informed that Medicine is not an exact science; therefore, there is also  the possibility of unforeseen risks and possible complications that may result in a catastrophic outcome. The patient indicated having understood very clearly. We have given the patient no guarantees and we have made no promises. Enough time was given to the patient to ask questions, all of which were answered to the patient's satisfaction.  Pre-Procedure Preparation: Safety Precautions: Allergies reviewed. Appropriate site, procedure, and patient were confirmed by following the Joint Commission's Universal Protocol (UP.01.01.01), in the form of a "Time Out". The patient was asked about blood thinners, or active infections, both of which were denied. Monitoring:  Clinical observation Infection Control Precautions: Sterile technique used. Standard Universal Precautions were taken as recommended by the Department of [facility] for Disease Control and Prevention (CDC). Standard pre-surgical skin prep was conducted. Respiratory hygiene and cough etiquette was practiced. Hand hygiene observed. Safe injection practices and needle disposal techniques followed. Medications properly checked for expiration dates and contaminants. Personal protective equipment (PPE) used: Sterile surgical gloves.  Controlled Substance Pharmacotherapy Assessment  Analgesic: Oxycodone IR 5 mg every 8 hours (15 mg/day) Pill Count: The patient did not bring her bottles since today is not her medication refill day for her pills. MME/day: 22.5 mg/day Pharmacokinetics: Onset of action (Liberation/Absorption): Within expected pharmacological parameters Time to Peak effect (Distribution): Timing and results are as within normal expected parameters Duration of action (Metabolism/Excretion): Within normal limits for medication Pharmacodynamics: Analgesic Effect: More than  50% Activity Facilitation: Medication(s) allow patient to sit, stand, walk, and do the basic ADLs Perceived Effectiveness: Described as relatively effective, allowing for increase in activities of daily living (ADL) Side-effects or Adverse reactions: None reported Monitoring: [place] PMP: Compliant with practice rules and regulations UDS Results/interpretation: The patient's last UDS was done on 07/27/2015 and the results were read as abnormal. There was the unexpected presence of hydrocodone which the patient has indicated she took when she went to her father's funeral and forget to take her pain medication with her. The fentanyl seen in the sample is that of the intrathecal pump. Medication Assessment Form: Reviewed. Abnormalities discussed Treatment compliance: Deficiencies noted and steps taken to remind the patient of the seriousness of adequate therapy compliance Risk Assessment: Aberrant Behavior: non-compliance with practice rules and regulations, obtaining controlled sustances from inappropriate sources and use of someone else's medication Substance Use Disorder (SUD) Risk Level: Moderate Opioid Risk Tool (ORT) Score:  0 Low Risk for SUD (Score <3) Depression Scale Score: PHQ-2: PHQ-2 Total Score: 0 No depression (0) PHQ-9: PHQ-9 Total Score: 0 No depression (0-4)  Pharmacologic Plan: No change in therapy, at this time  Intrathecal Pump Therapy: Side-effects or Adverse reactions: None reported. Effectiveness: Described as relatively effective, allowing for increase in activities of daily living (ADL). Plan: No changes in programming.  Anesthesia, Analgesia, Anxiolysis:  Type: None required Local Anesthetic(s): None used. IV Access: None. Sedation: Not necessary or indicated. Indication(s): None.  Description of Procedure Process:   Time-out: "Time-out" completed before starting procedure, as per protocol. Position: Supine Target Area: For Intrathecal Pump Refill(s), the target  is the central port. Approach: Anterior, 90 degree angle approach. Area Prepped: Entire Area around the pump implant. Prepping solution: ChloraPrep (2% chlorhexidine gluconate and 70% isopropyl alcohol) Safety Precautions: Aspiration looking for blood return was conducted prior to all injections. At no point did we inject any substances, as a needle was being advanced. No attempts were made at seeking any paresthesias. Safe injection practices and needle disposal techniques used. Medications properly checked for expiration dates. SDV (  single dose vial) medications used. Description of the Procedure: Protocol guidelines were followed. Two nurses trained to do implant refills were present during the entire procedure. The refill medication was checked by both healthcare providers as well as the patient. The patient was included in the "Time-out" to verify the medication. The patient was placed in position. The pump was identified. The area was prepped in the usual manner. The sterile template was positioned over the pump, making sure the side-port location matched that of the pump. Both, the pump and the template were held for stability. The needle provided in the Medtronic Kit was then introduced thru the center of the template and into the central port. The pump content was aspirated and discarded volume documented. The new medication was slowly infused into the pump, thru the filter, making sure to avoid overpressure of the device. The needle was then removed and the area cleansed, making sure to leave some of the prepping solution back to take advantage of its long term bactericidal properties. The pump was interrogated and programmed to reflect the correct medication, volume, and dosage. The program was printed and taken to the physician for approval. Once checked and signed by the physician, a copy was provided to the patient and another scanned into the EMR. EBL: None Materials Used:  Medtronic Refill  Kit  Post-procedure Assessment:  Complications: Uneventful. Disposition: Return to clinic in 3 to4 months for refill. (See printout) Comments:  No additional relevant information.  Assessment: Encounter Diagnosis:  Primary Diagnosis: Chronic pain syndrome [G89.4]  Plan: Patrycja was seen today for knee pain and back pain.  Diagnoses and all orders for this visit:  Chronic pain syndrome  Chronic low back pain (Bilateral) (L>R)  Chronic lower extremity pain (Left)  Encounter for adjustment or management of infusion pump  Presence of functional implant (Medtronic programmable intrathecal pump) (Right abdominal area)   Patient instructions:  Patient Instructions  Narcotic Overdose A narcotic overdose is the misuse or overuse of a narcotic drug. A narcotic overdose can make you pass out and stop breathing. If you are not treated right away, this can cause permanent brain damage or stop your heart. Medicine may be given to reverse the effects of an overdose. If so, this medicine may bring on withdrawal symptoms. The symptoms may be abdominal cramps, throwing up (vomiting), sweating, chills, and nervousness. Injecting narcotics can cause more problems than just an overdose. AIDS, hepatitis, and other very serious infections are transmitted by sharing needles and syringes. If you decide to quit using, there are medicines which can help you through the withdrawal period. Trying to quit all at once on your own can be uncomfortable, but not life-threatening. Call your caregiver, Narcotics Anonymous, or any drug and alcohol treatment program for further help.    This information is not intended to replace advice given to you by your health care provider. Make sure you discuss any questions you have with your health care provider.   Document Released: 07/26/2004 Document Revised: 07/09/2014 Document Reviewed: 12/08/2014 Elsevier Interactive Patient Education 2016 [place].     Primary  Care Physician: [name], NP Location: [facility] Attending Physician: Gaspar Cola, MD

## 2015-09-21 MED FILL — Medication: Qty: 1 | Status: AC

## 2015-09-23 ENCOUNTER — Encounter: Payer: Self-pay | Admitting: Internal Medicine

## 2015-09-23 ENCOUNTER — Ambulatory Visit (INDEPENDENT_AMBULATORY_CARE_PROVIDER_SITE_OTHER): Payer: Medicare Other | Admitting: Internal Medicine

## 2015-09-23 VITALS — BP 122/66 | HR 82 | Temp 98.8°F | Wt 227.0 lb

## 2015-09-23 DIAGNOSIS — R519 Headache, unspecified: Secondary | ICD-10-CM

## 2015-09-23 DIAGNOSIS — R51 Headache: Secondary | ICD-10-CM | POA: Diagnosis not present

## 2015-09-23 NOTE — Progress Notes (Signed)
Subjective:    Patient ID: Melinda [name], female    DOB: Aug 19, 1955, 60 y.o.   MRN: [id number]  HPI  Pt presents to the clinic today for TCM hospital follow up. She went to the ER 3/14 with c/o acute onset, severe headache that had started 3/12. The pain was initially located in the back of her head, then progressed to her whole head and behind her eyes. CT head showed no acute findings. CTA head and neck showed:  IMPRESSION: 1. Atherosclerotic changes at the carotid bifurcations bilaterally without significant stenosis on either side. 2. Mild diffuse small vessel disease, most prominent in the right MCA territory. 3. No significant proximal stenosis, aneurysm, or branch vessel occlusion within the circle of Willis. 4. Solid anterior cervical fusion at C3-4, C4-5, and C5-6.  She was treated with a combination of Tramadol and Compazine with great improvement. She was discharged home 3/14 and advised to follow up with pain management, neurosurgery and here. She saw pain management 3/21- they refilled her pain pump.  Since discharge, she reports she has been feeling well. She denies headache, blurred vision or dizziness. She really felt like her headache was caused by stress- dealing with her father's estate.  Review of Systems      Past Medical History  Diagnosis Date  . Degenerative disc disease   . Spinal stenosis   . Facet joint disease   . Bulging disc   . MRSA (methicillin resistant staph aureus) culture positive 2011    GREAT TOE RIGHT FOOT  . Fibromyalgia   . Peripheral neuropathy ([place])   . MCL deficiency, knee   . Coronary arteritis   . CAD (coronary artery disease) 2009    s/p stent to LAD  . Hyperlipidemia   . Heart disease   . 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 Chicken pox   . Anemia     years ago  . Sleep apnea     uses CPAP, sleep study at [facility]  . Neuropathy ([place]) 04/18/2010  . Diabetes mellitus without complication [facility])      Current Outpatient Prescriptions  Medication Sig Dispense Refill  . ACCU-CHEK AVIVA PLUS test strip TEST 2 TIMES DAY AS NEEDED **E11.9** 100 each 6  . ACCU-CHEK SOFTCLIX LANCETS lancets 1 each by Other route 2 (two) times daily. Use as instructed 100 each 2  . AMBULATORY NON FORMULARY MEDICATION Medication Name: CPAP MASK OF CHOICE FOR HOME DEVICE 1 each 0  . aspirin 81 MG tablet Take 81 mg by mouth daily.    . Blood Glucose Calibration (ACCU-CHEK AVIVA) SOLN 1 each by In Vitro route as needed. (Patient taking differently: 1 each by Other route See admin instructions. Check blood sugar once daily.) 1 each 1  . Blood Glucose Monitoring Suppl (ACCU-CHEK AVIVA PLUS) W/DEVICE KIT 1 Device by Does not apply route once. (Patient taking differently: 1 each by Other route See admin instructions. Check blood sugar once daily.) 1 kit 0  . buPROPion (WELLBUTRIN XL) 150 MG 24 hr tablet TAKE 1 TABLET BY MOUTH DAILY 90 tablet 3  . isosorbide dinitrate (ISORDIL) 30 MG tablet TAKE 1 TABLET BY MOUTH DAILY 30 tablet 8  . nitroGLYCERIN (NITROSTAT) 0.4 MG SL tablet Place 1 tablet (0.4 mg total) under the tongue every 5 (five) minutes as needed for chest pain. 25 tablet 2  . oxyCODONE (OXY IR/ROXICODONE) 5 MG immediate release tablet Take 1 tablet (5 mg total) by mouth every 8 (  eight) hours as needed for moderate pain or severe pain. 90 tablet 0  . PAIN MANAGEMENT IT PUMP REFILL 1 each by Intrathecal route once. Medication: PF Fentanyl 500.0 mcg/ml PF Baclofen 300.58mg/ml PF Bupivicaine 20.0 mg/ml Total Volume: 40 ml Needed by 09-20-15 @ 1440 1 each 0  . PARoxetine (PAXIL) 20 MG tablet TAKE ONE-HALF (1/2) OF A TABLET BY MOUTHTWICE DAILY 90 tablet 0  . polyethylene glycol (MIRALAX / GLYCOLAX) packet Take 17 g by mouth daily.    . rosuvastatin (CRESTOR) 20 MG tablet Take 1 tablet (20 mg total) by mouth daily. 30 tablet 7  . tiZANidine (ZANAFLEX) 4 MG tablet Take 1 tablet (4 mg total) by mouth every 6 (six) hours  as needed. 120 tablet 2  . pregabalin (LYRICA) 100 MG capsule Take 1 capsule (100 mg total) by mouth 3 (three) times daily. 90 capsule 2   No current facility-administered medications for this visit.    No Known Allergies  Family History  Problem Relation Age of Onset  . Cancer Mother     lung  . Diabetes Father   . Heart disease Maternal Grandfather   . Diabetes Paternal Grandmother   . Cancer Paternal Grandfather     colon  . Stroke Neg Hx     Social History   Social History  . Marital Status: Significant Other    Spouse Name: N/A  . Number of Children: N/A  . Years of Education: N/A   Occupational History  . Not on file.   Social History Main Topics  . Smoking status: Former Smoker -- 1.50 packs/day for 32 years    Types: Cigarettes    Quit date: 07/22/2007  . Smokeless tobacco: Never Used  . Alcohol Use: Yes  . Drug Use: No  . Sexual Activity: Yes   Other Topics Concern  . Not on file   Social History Narrative     Constitutional: Denies fever, malaise, fatigue, headache or abrupt weight changes.  Respiratory: Denies difficulty breathing, shortness of breath, cough or sputum production.   Cardiovascular: Denies chest pain, chest tightness, palpitations or swelling in the hands or feet.  Musculoskeletal: Pt reports chronic pain. Denies difficulty with gait, muscle pain or joint swelling.  Skin: Denies redness, rashes, lesions or ulcercations.  Neurological: Denies dizziness, difficulty with memory, difficulty with speech or problems with balance and coordination.  Psych: Pt has history of depression. Denies SI/HI.  No other specific complaints in a complete review of systems (except as listed in HPI above).  Objective:   Physical Exam  BP 122/66 mmHg  Pulse 82  Temp(Src) 98.8 F (37.1 C) (Oral)  Wt 227 lb (102.967 kg)  SpO2 97%  Wt Readings from Last 3 Encounters:  09/23/15 227 lb (102.967 kg)  09/20/15 220 lb (99.791 kg)  09/13/15 225 lb  (102.059 kg)    General: Appears her stated age, obese in NAD. HEENT: Head: normal shape and size; Eyes: sclera white, no icterus, conjunctiva pink, PERRLA and EOMs intact;  Cardiovascular: Normal rate and rhythm. S1,S2 noted.  No murmur, rubs or gallops noted.  Pulmonary/Chest: Normal effort and positive vesicular breath sounds. No respiratory distress. No wheezes, rales or ronchi noted.  Musculoskeletal: Normal flexion and rotation of the neck. Decreased extension. Neurological: Alert and oriented. Coordination normal.  Psychiatric: Mood and affect normal. Behavior is normal. Judgment and thought content normal.     BMET    Component Value Date/Time   NA 140 09/10/2015 1751   NA 135*  06/30/2013 1203   K 4.6 09/10/2015 1751   K 4.1 06/30/2013 1203   CL 102 09/10/2015 1751   CL 102 06/30/2013 1203   CO2 27 09/10/2015 1751   CO2 32 06/30/2013 1203   GLUCOSE 143* 09/10/2015 1751   GLUCOSE 143* 06/30/2013 1203   BUN 25* 09/10/2015 1751   BUN 19* 06/30/2013 1203   CREATININE 1.00 09/10/2015 1751   CREATININE 0.89 06/30/2013 1203   CALCIUM 9.1 09/10/2015 1751   CALCIUM 9.7 06/30/2013 1203   GFRNONAA >60 09/10/2015 1751   GFRNONAA >60 06/30/2013 1203   GFRAA >60 09/10/2015 1751   GFRAA >60 06/30/2013 1203    Lipid Panel     Component Value Date/Time   CHOL 120 09/11/2015 0421   TRIG 86 09/11/2015 0421   HDL 41 09/11/2015 0421   CHOLHDL 2.9 09/11/2015 0421   VLDL 17 09/11/2015 0421   LDLCALC 62 09/11/2015 0421    CBC    Component Value Date/Time   WBC 9.6 09/10/2015 1751   WBC 10.6 11/12/2011 1136   RBC 4.49 09/10/2015 1751   RBC 3.47* 10/05/2013 0805   RBC 4.75 11/12/2011 1136   HGB 12.7 09/10/2015 1751   HGB 14.0 11/12/2011 1136   HCT 38.9 09/10/2015 1751   HCT 40.7 11/12/2011 1136   PLT 206 09/10/2015 1751   PLT 274 11/12/2011 1136   MCV 86.6 09/10/2015 1751   MCV 86 11/12/2011 1136   MCH 28.3 09/10/2015 1751   MCH 29.5 11/12/2011 1136   MCHC 32.6  09/10/2015 1751   MCHC 34.5 11/12/2011 1136   RDW 12.8 09/10/2015 1751   RDW 12.8 11/12/2011 1136   LYMPHSABS 1.7 07/21/2014 1340   LYMPHSABS 2.5 11/12/2011 1136   MONOABS 0.7 07/21/2014 1340   MONOABS 0.8 11/12/2011 1136   EOSABS 0.3 07/21/2014 1340   EOSABS 0.6 11/12/2011 1136   BASOSABS 0.0 07/21/2014 1340   BASOSABS 0.1 11/12/2011 1136    Hgb A1C Lab Results  Component Value Date   HGBA1C 6.3* 09/11/2015        Assessment & Plan:   TCM hospital followup for headache:  Hospital notes, labs and imaging reviewed No recurrence Likely stress related- we discussed ways to reduce stress including deep breathing and distraction Will continue to monitor, if headaches return, she will let me know and will reevaluate  RTC as needed or if symptoms persist or worsen

## 2015-09-23 NOTE — Progress Notes (Signed)
Pre visit review using our clinic review tool, if applicable. No additional management support is needed unless otherwise documented below in the visit note. 

## 2015-09-23 NOTE — Patient Instructions (Signed)

## 2015-09-30 DIAGNOSIS — M1711 Unilateral primary osteoarthritis, right knee: Secondary | ICD-10-CM | POA: Diagnosis not present

## 2015-09-30 DIAGNOSIS — S83206A Unspecified tear of unspecified meniscus, current injury, right knee, initial encounter: Secondary | ICD-10-CM | POA: Diagnosis not present

## 2015-10-11 ENCOUNTER — Telehealth: Payer: Self-pay | Admitting: Internal Medicine

## 2015-10-11 NOTE — Telephone Encounter (Signed)
[place] [facility] Patient Name: Melinda [name] DOB: 1956/01/13 Initial Comment caller states she has a swollen big toe on her left foot. You can see blood underneath the nail- she is concerned - she has already lost the other big toe - had the same sx Nurse Assessment Nurse: Markus Daft, RN, Sherre Poot Date/Time ([place] Time): 10/11/2015 11:41:37 AM Confirm and document reason for call. If symptomatic, describe symptoms. You must click the next button to save text entered. ---Caller states she has a swollen big toe on her left foot. She can see blood underneath the nail. She clipped her toe nail last night and some clear fluid draining from it, and has cont. to have it. Has the patient traveled out of the country within the last 30 days? ---Not Applicable Does the patient have any new or worsening symptoms? ---Yes Will a triage be completed? ---Yes Related visit to physician within the last 2 weeks? ---No Does the PT have any chronic conditions? (i.e. diabetes, asthma, etc.) ---Yes List chronic conditions. ---NIDDM; Fibromyalgia, Degenerative disk disease, spinal stenosis, CAD, chronic pain, pump in abdomen that gives her narcotic pain meds constantly, H/o significant peripheral neuropathy. She has already lost the other big toe. h/o hammer toes. h/o MRSA Is this a behavioral health or substance abuse call? ---No Guidelines Guideline Title Affirmed Question Affirmed Notes Toenail - Ingrown Entire toe is swollen Final Disposition User See Physician within [place], [place], [place] Comments Appt made for 1:30 10/12/15 with PCP Referrals REFERRED TO PCP OFFICE Disagree/Comply: Comply

## 2015-10-11 NOTE — Telephone Encounter (Signed)
Pt has appt on 10/12/15 at 1:30 with R Baity NP.

## 2015-10-12 ENCOUNTER — Ambulatory Visit (INDEPENDENT_AMBULATORY_CARE_PROVIDER_SITE_OTHER): Payer: Medicare Other | Admitting: Internal Medicine

## 2015-10-12 ENCOUNTER — Encounter: Payer: Self-pay | Admitting: Internal Medicine

## 2015-10-12 VITALS — BP 102/50 | HR 78 | Temp 99.2°F | Wt 231.5 lb

## 2015-10-12 DIAGNOSIS — I2510 Atherosclerotic heart disease of native coronary artery without angina pectoris: Secondary | ICD-10-CM

## 2015-10-12 DIAGNOSIS — L03032 Cellulitis of left toe: Secondary | ICD-10-CM | POA: Diagnosis not present

## 2015-10-12 MED ORDER — SULFAMETHOXAZOLE-TRIMETHOPRIM 800-160 MG PO TABS: 2.0000 | ORAL_TABLET | Freq: Two times a day (BID) | ORAL | Status: DC

## 2015-10-12 NOTE — Patient Instructions (Signed)
Paronychia °Paronychia is an infection of the skin that surrounds a nail. It usually affects the skin around a fingernail, but it may also occur near a toenail. It often causes pain and swelling around the nail. This condition may come on suddenly or develop over a longer period. In some cases, a collection of pus (abscess) can form near or under the nail. Usually, paronychia is not serious and it clears up with treatment. °CAUSES °This condition may be caused by bacteria or fungi. It is commonly caused by either Streptococcus or Staphylococcus bacteria. The bacteria or fungi often cause the infection by getting into the affected area through an opening in the skin, such as a cut or a hangnail. °RISK FACTORS °This condition is more likely to develop in: °· People who get their hands wet often, such as those who work as dishwashers, bartenders, or nurses. °· People who bite their fingernails or suck their thumbs. °· People who trim their nails too short. °· People who have hangnails or injured fingertips. °· People who get manicures. °· People who have diabetes. °SYMPTOMS °Symptoms of this condition include: °· Redness and swelling of the skin near the nail. °· Tenderness around the nail when you touch the area. °· Pus-filled bumps under the cuticle. The cuticle is the skin at the base or sides of the nail. °· Fluid or pus under the nail. °· Throbbing pain in the area. °DIAGNOSIS °This condition is usually diagnosed with a physical exam. In some cases, a sample of pus may be taken from an abscess to be tested in a lab. This can help to determine what type of bacteria or fungi is causing the condition. °TREATMENT °Treatment for this condition depends on the cause and severity of the condition. If the condition is mild, it may clear up on its own in a few days. Your health care provider may recommend soaking the affected area in warm water a few times a day. When treatment is needed, the options may  include: °· Antibiotic medicine, if the condition is caused by a bacterial infection. °· Antifungal medicine, if the condition is caused by a fungal infection. °· Incision and drainage, if an abscess is present. In this procedure, the health care provider will cut open the abscess so the pus can drain out. °HOME CARE INSTRUCTIONS °· Soak the affected area in warm water if directed to do so by your health care provider. You may be told to do this for 20 minutes, 2-3 times a day. Keep the area dry in between soakings. °· Take medicines only as directed by your health care provider. °· If you were prescribed an antibiotic medicine, finish all of it even if you start to feel better. °· Keep the affected area clean. °· Do not try to drain a fluid-filled bump yourself. °· If you will be washing dishes or performing other tasks that require your hands to get wet, wear rubber gloves. You should also wear gloves if your hands might come in contact with irritating substances, such as cleaners or chemicals. °· Follow your health care provider's instructions about: °¨ Wound care. °¨ Bandage (dressing) changes and removal. °SEEK MEDICAL CARE IF: °· Your symptoms get worse or do not improve with treatment. °· You have a fever or chills. °· You have redness spreading from the affected area. °· You have continued or increased fluid, blood, or pus coming from the affected area. °· Your finger or knuckle becomes swollen or is difficult to move. °  °  This information is not intended to replace advice given to you by your health care provider. Make sure you discuss any questions you have with your health care provider. °  °Document Released: 12/12/2000 Document Revised: 11/02/2014 Document Reviewed: 05/26/2014 °Elsevier Interactive Patient Education ©2016 [employer] ° °

## 2015-10-12 NOTE — Progress Notes (Signed)
Pre visit review using our clinic review tool, if applicable. No additional management support is needed unless otherwise documented below in the visit note. 

## 2015-10-12 NOTE — Progress Notes (Signed)
Subjective:    Patient ID: Melinda [name], female    DOB: 1956-01-02, 60 y.o.   MRN: [id number]  HPI  Pt presents to the clinic today with c/o swelling, redness and pain of her left great toe. This started 3 days ago. She describes the pain as throbbing. She has noticed some clear drainage from underneath the nailbed. It is red, but not warm. She denies fever, chills or body aches. She has not tried anything OTC. She did recently cut her nails and her cuticles. 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  Review of Systems      Past Medical History  Diagnosis Date  . Degenerative disc disease   . Spinal stenosis   . Facet joint disease   . Bulging disc   . MRSA (methicillin resistant staph aureus) culture positive 2011    GREAT TOE RIGHT FOOT  . Fibromyalgia   . Peripheral neuropathy ([place])   . MCL deficiency, knee   . Coronary arteritis   . CAD (coronary artery disease) 2009    s/p stent to LAD  . Hyperlipidemia   . Heart disease   . 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 Chicken pox   . Anemia     years ago  . Sleep apnea     uses CPAP, sleep study at [facility]  . Neuropathy ([place]) 04/18/2010  . Diabetes mellitus without complication [facility])     Current Outpatient Prescriptions  Medication Sig Dispense Refill  . ACCU-CHEK AVIVA PLUS test strip TEST 2 TIMES DAY AS NEEDED **E11.9** 100 each 6  . ACCU-CHEK SOFTCLIX LANCETS lancets 1 each by Other route 2 (two) times daily. Use as instructed 100 each 2  . AMBULATORY NON FORMULARY MEDICATION Medication Name: CPAP MASK OF CHOICE FOR HOME DEVICE 1 each 0  . aspirin 81 MG tablet Take 81 mg by mouth daily.    . Blood Glucose Calibration (ACCU-CHEK AVIVA) SOLN 1 each by In Vitro route as needed. (Patient taking  differently: 1 each by Other route See admin instructions. Check blood sugar once daily.) 1 each 1  . Blood Glucose Monitoring Suppl (ACCU-CHEK AVIVA PLUS) W/DEVICE KIT 1 Device by Does not apply route once. (Patient taking differently: 1 each by Other route See admin instructions. Check blood sugar once daily.) 1 kit 0  . buPROPion (WELLBUTRIN XL) 150 MG 24 hr tablet TAKE 1 TABLET BY MOUTH DAILY 90 tablet 3  . ibuprofen (ADVIL,MOTRIN) 200 MG tablet Take 400 mg by mouth every 6 (six) hours as needed.    . isosorbide dinitrate (ISORDIL) 30 MG tablet TAKE 1 TABLET BY MOUTH DAILY 30 tablet 8  . nitroGLYCERIN (NITROSTAT) 0.4 MG SL tablet Place 1 tablet (0.4 mg total) under the tongue every 5 (five) minutes as needed for chest pain. 25 tablet 2  . oxyCODONE (OXY IR/ROXICODONE) 5 MG immediate release tablet Take 1 tablet (5 mg total) by mouth every 8 (eight) hours as needed for moderate pain or severe pain. 90 tablet 0  . PAIN MANAGEMENT IT PUMP REFILL 1 each by Intrathecal route once. Medication: PF Fentanyl 500.0 mcg/ml PF Baclofen 300.8mg/ml PF Bupivicaine 20.0 mg/ml Total Volume: 40 ml Needed by 09-20-15 @ 1440 1 each 0  . PARoxetine (PAXIL) 20  MG tablet TAKE ONE-HALF (1/2) OF A TABLET BY MOUTHTWICE DAILY 90 tablet 0  . polyethylene glycol (MIRALAX / GLYCOLAX) packet Take 17 g by mouth daily.    . rosuvastatin (CRESTOR) 20 MG tablet Take 1 tablet (20 mg total) by mouth daily. 30 tablet 7  . tiZANidine (ZANAFLEX) 4 MG tablet Take 1 tablet (4 mg total) by mouth every 6 (six) hours as needed. 120 tablet 2  . pregabalin (LYRICA) 100 MG capsule Take 1 capsule (100 mg total) by mouth 3 (three) times daily. 90 capsule 2  . sulfamethoxazole-trimethoprim (BACTRIM DS,SEPTRA DS) 800-160 MG tablet Take 2 tablets by mouth 2 (two) times daily. 40 tablet 0   No current facility-administered medications for this visit.    No Known Allergies  Family History  Problem Relation Age of Onset  . Cancer Mother      lung  . Diabetes Father   . Heart disease Maternal Grandfather   . Diabetes Paternal Grandmother   . Cancer Paternal Grandfather     colon  . Stroke Neg Hx     Social History   Social History  . Marital Status: Significant Other    Spouse Name: N/A  . Number of Children: N/A  . Years of Education: N/A   Occupational History  . Not on file.   Social History Main Topics  . Smoking status: Former Smoker -- 1.50 packs/day for 32 years    Types: Cigarettes    Quit date: 07/22/2007  . Smokeless tobacco: Never Used  . Alcohol Use: 0.0 oz/week    0 Standard drinks or equivalent per week     Comment: occ  . Drug Use: No  . Sexual Activity: Yes   Other Topics Concern  . Not on file   Social History Narrative     Constitutional: Denies fever, malaise, fatigue, headache or abrupt weight changes.  Respiratory: Denies difficulty breathing, shortness of breath, cough or sputum production.   Cardiovascular: Denies chest pain, chest tightness, palpitations or swelling in the hands or feet.  Musculoskeletal: Pt reports left great toe pain. Denies decrease in range of motion, difficulty with gait, muscle pain.  Skin: Pt reports redness of left great toe. Denies rashes, lesions or ulcercations.  Neurological: Pt reports numbness of her feet. Denies dizziness, difficulty with memory, difficulty with speech or problems with balance and coordination.    No other specific complaints in a complete review of systems (except as listed in HPI above).  Objective:   Physical Exam  BP 102/50 mmHg  Pulse 78  Temp(Src) 99.2 F (37.3 C) (Oral)  Wt 231 lb 8 oz (105.008 kg)  SpO2 94% Wt Readings from Last 3 Encounters:  10/12/15 231 lb 8 oz (105.008 kg)  09/23/15 227 lb (102.967 kg)  09/20/15 220 lb (99.791 kg)    General: Appears her stated age, obese in NAD. Skin: Paronychia of left great toe. No fungal toenail infection. She has a scab on the tip of her 3rd toe, left  foot. Cardiovascular: Normal rate and rhythm. S1,S2 noted.  No murmur, rubs or gallops noted.  Pulmonary/Chest: Normal effort and positive vesicular breath sounds. No respiratory distress. No wheezes, rales or ronchi noted.  Musculoskeletal: Normal flexion and extension of the left great toe. No difficulty with gait.   BMET    Component Value Date/Time   NA 140 09/10/2015 1751   NA 135* 06/30/2013 1203   K 4.6 09/10/2015 1751   K 4.1 06/30/2013 1203   CL 102  09/10/2015 1751   CL 102 06/30/2013 1203   CO2 27 09/10/2015 1751   CO2 32 06/30/2013 1203   GLUCOSE 143* 09/10/2015 1751   GLUCOSE 143* 06/30/2013 1203   BUN 25* 09/10/2015 1751   BUN 19* 06/30/2013 1203   CREATININE 1.00 09/10/2015 1751   CREATININE 0.89 06/30/2013 1203   CALCIUM 9.1 09/10/2015 1751   CALCIUM 9.7 06/30/2013 1203   GFRNONAA >60 09/10/2015 1751   GFRNONAA >60 06/30/2013 1203   GFRAA >60 09/10/2015 1751   GFRAA >60 06/30/2013 1203    Lipid Panel     Component Value Date/Time   CHOL 120 09/11/2015 0421   TRIG 86 09/11/2015 0421   HDL 41 09/11/2015 0421   CHOLHDL 2.9 09/11/2015 0421   VLDL 17 09/11/2015 0421   LDLCALC 62 09/11/2015 0421    CBC    Component Value Date/Time   WBC 9.6 09/10/2015 1751   WBC 10.6 11/12/2011 1136   RBC 4.49 09/10/2015 1751   RBC 3.47* 10/05/2013 0805   RBC 4.75 11/12/2011 1136   HGB 12.7 09/10/2015 1751   HGB 14.0 11/12/2011 1136   HCT 38.9 09/10/2015 1751   HCT 40.7 11/12/2011 1136   PLT 206 09/10/2015 1751   PLT 274 11/12/2011 1136   MCV 86.6 09/10/2015 1751   MCV 86 11/12/2011 1136   MCH 28.3 09/10/2015 1751   MCH 29.5 11/12/2011 1136   MCHC 32.6 09/10/2015 1751   MCHC 34.5 11/12/2011 1136   RDW 12.8 09/10/2015 1751   RDW 12.8 11/12/2011 1136   LYMPHSABS 1.7 07/21/2014 1340   LYMPHSABS 2.5 11/12/2011 1136   MONOABS 0.7 07/21/2014 1340   MONOABS 0.8 11/12/2011 1136   EOSABS 0.3 07/21/2014 1340   EOSABS 0.6 11/12/2011 1136   BASOSABS 0.0 07/21/2014  1340   BASOSABS 0.1 11/12/2011 1136    Hgb A1C Lab Results  Component Value Date   HGBA1C 6.3* 09/11/2015         Assessment & Plan:   Paronychia of left great toe:  Epsom salts twice daily eRx for Septra DS BID x 10 days Watch for increased redness, swelling, pain or fevers  RTC as needed or if symptoms worsen

## 2015-10-17 ENCOUNTER — Encounter: Payer: Medicare Other | Admitting: Pain Medicine

## 2015-10-17 ENCOUNTER — Telehealth: Payer: Self-pay | Admitting: Internal Medicine

## 2015-10-17 NOTE — Telephone Encounter (Signed)
Pt called with update - her toe looks better. Pt cleaned with alcohol pad and one side came loose.   Pt wants to know if she needs to be seen and request cb  [phone] Thank you

## 2015-10-18 ENCOUNTER — Ambulatory Visit: Payer: Medicare Other | Attending: Pain Medicine | Admitting: Pain Medicine

## 2015-10-18 ENCOUNTER — Encounter: Payer: Self-pay | Admitting: Pain Medicine

## 2015-10-18 VITALS — BP 134/86 | HR 79 | Temp 98.6°F | Resp 18 | Ht 69.0 in | Wt 228.0 lb

## 2015-10-18 DIAGNOSIS — Z87891 Personal history of nicotine dependence: Secondary | ICD-10-CM | POA: Diagnosis not present

## 2015-10-18 DIAGNOSIS — Z9889 Other specified postprocedural states: Secondary | ICD-10-CM | POA: Diagnosis not present

## 2015-10-18 DIAGNOSIS — Z955 Presence of coronary angioplasty implant and graft: Secondary | ICD-10-CM | POA: Diagnosis not present

## 2015-10-18 DIAGNOSIS — E669 Obesity, unspecified: Secondary | ICD-10-CM | POA: Insufficient documentation

## 2015-10-18 DIAGNOSIS — Z6839 Body mass index (BMI) 39.0-39.9, adult: Secondary | ICD-10-CM | POA: Diagnosis not present

## 2015-10-18 DIAGNOSIS — M542 Cervicalgia: Secondary | ICD-10-CM | POA: Diagnosis not present

## 2015-10-18 DIAGNOSIS — Z5181 Encounter for therapeutic drug level monitoring: Secondary | ICD-10-CM

## 2015-10-18 DIAGNOSIS — Z79891 Long term (current) use of opiate analgesic: Secondary | ICD-10-CM | POA: Diagnosis not present

## 2015-10-18 DIAGNOSIS — F1190 Opioid use, unspecified, uncomplicated: Secondary | ICD-10-CM

## 2015-10-18 DIAGNOSIS — M545 Low back pain: Secondary | ICD-10-CM | POA: Diagnosis not present

## 2015-10-18 DIAGNOSIS — E119 Type 2 diabetes mellitus without complications: Secondary | ICD-10-CM | POA: Diagnosis not present

## 2015-10-18 DIAGNOSIS — M62838 Other muscle spasm: Secondary | ICD-10-CM | POA: Insufficient documentation

## 2015-10-18 DIAGNOSIS — Z981 Arthrodesis status: Secondary | ICD-10-CM | POA: Insufficient documentation

## 2015-10-18 DIAGNOSIS — Z89419 Acquired absence of unspecified great toe: Secondary | ICD-10-CM | POA: Diagnosis not present

## 2015-10-18 DIAGNOSIS — K5903 Drug induced constipation: Secondary | ICD-10-CM | POA: Insufficient documentation

## 2015-10-18 DIAGNOSIS — M25569 Pain in unspecified knee: Secondary | ICD-10-CM | POA: Diagnosis present

## 2015-10-18 DIAGNOSIS — E785 Hyperlipidemia, unspecified: Secondary | ICD-10-CM | POA: Diagnosis not present

## 2015-10-18 DIAGNOSIS — E7800 Pure hypercholesterolemia, unspecified: Secondary | ICD-10-CM | POA: Insufficient documentation

## 2015-10-18 DIAGNOSIS — G8929 Other chronic pain: Secondary | ICD-10-CM

## 2015-10-18 DIAGNOSIS — M179 Osteoarthritis of knee, unspecified: Secondary | ICD-10-CM | POA: Diagnosis not present

## 2015-10-18 DIAGNOSIS — M25562 Pain in left knee: Secondary | ICD-10-CM

## 2015-10-18 DIAGNOSIS — I2510 Atherosclerotic heart disease of native coronary artery without angina pectoris: Secondary | ICD-10-CM | POA: Diagnosis not present

## 2015-10-18 DIAGNOSIS — Z9689 Presence of other specified functional implants: Secondary | ICD-10-CM | POA: Insufficient documentation

## 2015-10-18 DIAGNOSIS — M47816 Spondylosis without myelopathy or radiculopathy, lumbar region: Secondary | ICD-10-CM | POA: Diagnosis not present

## 2015-10-18 DIAGNOSIS — R079 Chest pain, unspecified: Secondary | ICD-10-CM | POA: Diagnosis not present

## 2015-10-18 DIAGNOSIS — F329 Major depressive disorder, single episode, unspecified: Secondary | ICD-10-CM | POA: Diagnosis not present

## 2015-10-18 DIAGNOSIS — M797 Fibromyalgia: Secondary | ICD-10-CM | POA: Diagnosis not present

## 2015-10-18 MED ORDER — OXYCODONE HCL 5 MG PO TABS: 5.0000 mg | ORAL_TABLET | Freq: Three times a day (TID) | ORAL | Status: DC | PRN

## 2015-10-18 MED ORDER — TIZANIDINE HCL 4 MG PO TABS: 4.0000 mg | ORAL_TABLET | Freq: Four times a day (QID) | ORAL | Status: DC | PRN

## 2015-10-18 MED ORDER — PREGABALIN 100 MG PO CAPS: 100.0000 mg | ORAL_CAPSULE | Freq: Three times a day (TID) | ORAL | Status: DC

## 2015-10-18 NOTE — Patient Instructions (Signed)

## 2015-10-18 NOTE — Telephone Encounter (Signed)
Does not need to be seen unless she is concerned that it is not healing properly.

## 2015-10-18 NOTE — Progress Notes (Signed)
Safety precautions to be maintained throughout the outpatient stay will include: orient to surroundings, keep bed in low position, maintain call bell within reach at all times, provide assistance with transfer out of bed and ambulation.  58  Out of 90 Oxycodone 5 mg remaining. Filled on 09-26-15.

## 2015-10-18 NOTE — Progress Notes (Signed)
Currently on Bactrim for paronchyma

## 2015-10-18 NOTE — Progress Notes (Signed)
Patient's Name: Melinda [name]  Patient type: Established  MRN: [id number]  Service setting: Ambulatory outpatient  DOB: 1956-06-29  Location: [facility]  DOS: 10/18/2015  Primary Care Physician: [name], NP  Note by: Kathlen Brunswick. Dossie Arbour, M.D, DABA, DABAPM, DABPM, DABIPP, [place]  Referring Physician: Jearld Fenton, NP  Specialty: Board-Certified Interventional Pain Management     Primary Reason(s) for Visit: Encounter for prescription drug management (Level of risk: moderate) CC: Knee Pain   HPI  Melinda [name] is a 60 y.o. year old, female patient, who returns today as an established patient. She has Chronic pain syndrome; CAD (coronary artery disease); Obesity (BMI 30-39.9); Anxiety and depression; Pure hypercholesterolemia; DM2 (diabetes mellitus, type 2) ([place]); Great toe amputation status ([place]); Personal history of spine surgery; Failed back surgical syndrome; Failed cervical surgery syndrome (ACDF); Chronic pain; Chronic neck pain; Chronic low back pain (Bilateral) (L>R); Lumbar spondylosis; Epidural fibrosis; Cervical spondylosis; Cervical facet syndrome; Chronic lower extremity pain (Left); Chronic lumbar radicular pain (Left); Iliotibial band syndrome (Left); Diffuse myofascial pain syndrome; Musculoskeletal pain; Muscle spasms of lower extremities (Bilateral); Muscle spasticity; Neurogenic pain; Neuropathic pain; Lumbar facet syndrome (Bilateral); Fibromyalgia; Long term current use of opiate analgesic; Long term prescription opiate use; Opiate use (22.5 MME/Day) (oral); Opioid dependence ([place]); Encounter for therapeutic drug level monitoring; Encounter for chronic pain management; Presence of functional implant (Medtronic programmable intrathecal pump) (Right abdominal area); Presence of intrathecal pump (Medtronic intrathecal programmable pump) (40 mL pump); Encounter for interrogation of infusion pump; Encounter for adjustment or management of infusion pump;  Therapeutic opioid-induced constipation (OIC); Chronic knee pain (Left); Osteoarthritis of knee (Left); Chest pain; Headache; Depression; and HLD (hyperlipidemia) on her problem list.. Her primarily concern today is the Knee Pain   Pain Assessment: Self-Reported Pain Score: 2  Reported level is compatible with observation Pain Type: Chronic pain Pain Location: Knee Pain Orientation: Right Pain Descriptors / Indicators: Aching, Sharp, Stabbing Pain Frequency: Constant  The patient comes in today clinics today for pharmacological management of her chronic pain.  Date of Last Visit: 09/20/15 Service Provided on Last Visit: Med Refill  Controlled Substance Pharmacotherapy Assessment & REMS (Risk Evaluation and Mitigation Strategy)  Analgesic: Oxycodone IR 5 mg every 8 hours (15 mg/day) + intrathecal pump medications Pill Count: 58 out of 90 Oxycodone 5 mg remaining. Filled on 09-26-15. MME/day: 22.5 mg/day (oral).  Pharmacokinetics: Onset of action (Liberation/Absorption): Within expected pharmacological parameters Time to Peak effect (Distribution): Timing and results are as within normal expected parameters Duration of action (Metabolism/Excretion): Within normal limits for medication Pharmacodynamics: Analgesic Effect: More than 50% Activity Facilitation: Medication(s) allow patient to sit, stand, walk, and do the basic ADLs Perceived Effectiveness: Described as relatively effective, allowing for increase in activities of daily living (ADL) Side-effects or Adverse reactions: None reported Monitoring: [place] PMP: Online review of the past 38-monthperiod conducted. Compliant with practice rules and regulations  UDS Results/interpretation: The patient's last UDS was done on 07/27/2015 and it came back with unexpected results due to the presence of undeclared hydrocodone and fentanyl. However, the fentanyl comes from her intrathecal pump and it was a simple error inflammation. In the case of  the hydrocodone, the patient told uKoreathat she recently went to her father's funeral and due to the hasting as of the trip did not pack all of her medications and she was facilitated a hydrocodone which did show up in the UDS. For similar psychological reasons the patient did smoke some marijuana with friends,  which she assures me that she never does and she confessed to it, but this actually did not show up in the UDS. The patient was given a final warning with regards to this and she has assured me that she will never do this again. Because of the years she has been very compliant with her therapy and she has never lied to me I believe that this is true and therefore I have simply given her a warning. She understood and accepted. Medication Assessment Form: Reviewed. Abnormalities discussed Treatment compliance: Deficiencies noted and steps taken to remind the patient of the seriousness of adequate therapy compliance Risk Assessment: Aberrant Behavior: None observed today Substance Use Disorder (SUD) Risk Level: Low Risk of opioid abuse or dependence: 0.7-3.0% with doses ? 36 MME/day and 6.1-26% with doses ? 120 MME/day. Opioid Risk Tool (ORT) Score: Total Score: 3 Low Risk for SUD (Score <3) Depression Scale Score: PHQ-2: PHQ-2 Total Score: 0 No depression (0) PHQ-9: PHQ-9 Total Score: 0 No depression (0-4)  Pharmacologic Plan: No change in therapy, at this time  Laboratory Chemistry  Inflammation Markers Lab Results  Component Value Date   ESRSEDRATE 16 09/18/2010   CRP 0.2 09/18/2010    Renal Function Lab Results  Component Value Date   BUN 25* 09/10/2015   CREATININE 1.00 09/10/2015   GFRAA >60 09/10/2015   GFRNONAA >60 09/10/2015    Hepatic Function Lab Results  Component Value Date   AST 19 07/28/2015   ALT 25 07/28/2015   ALBUMIN 4.5 07/28/2015    Electrolytes Lab Results  Component Value Date   NA 140 09/10/2015   K 4.6 09/10/2015   CL 102 09/10/2015   CALCIUM  9.1 09/10/2015   MG 2.1 06/30/2013    Pain Modulating Vitamins Lab Results  Component Value Date   VITAMINB12 319 10/04/2013    Coagulation Parameters Lab Results  Component Value Date   INR 1.12 09/11/2015   LABPROT 14.6 09/11/2015    Note: I personally reviewed the above data. Results shared with patient.  Meds  The patient has a current medication list which includes the following prescription(s): accu-chek aviva plus, accu-chek softclix lancets, AMBULATORY NON FORMULARY MEDICATION, aspirin, accu-chek aviva, accu-chek aviva plus, bupropion, ibuprofen, isosorbide dinitrate, nitroglycerin, oxycodone, PAIN MANAGEMENT IT PUMP REFILL, paroxetine, rosuvastatin, sulfamethoxazole-trimethoprim, tizanidine, oxycodone, oxycodone, polyethylene glycol, and pregabalin.  Current Outpatient Prescriptions on File Prior to Visit  Medication Sig  . ACCU-CHEK AVIVA PLUS test strip TEST 2 TIMES DAY AS NEEDED **E11.9**  . ACCU-CHEK SOFTCLIX LANCETS lancets 1 each by Other route 2 (two) times daily. Use as instructed  . AMBULATORY NON FORMULARY MEDICATION Medication Name: CPAP MASK OF CHOICE FOR HOME DEVICE  . aspirin 81 MG tablet Take 81 mg by mouth daily.  . Blood Glucose Calibration (ACCU-CHEK AVIVA) SOLN 1 each by In Vitro route as needed. (Patient taking differently: 1 each by Other route See admin instructions. Check blood sugar once daily.)  . Blood Glucose Monitoring Suppl (ACCU-CHEK AVIVA PLUS) W/DEVICE KIT 1 Device by Does not apply route once. (Patient taking differently: 1 each by Other route See admin instructions. Check blood sugar once daily.)  . buPROPion (WELLBUTRIN XL) 150 MG 24 hr tablet TAKE 1 TABLET BY MOUTH DAILY  . ibuprofen (ADVIL,MOTRIN) 200 MG tablet Take 400 mg by mouth every 6 (six) hours as needed.  . isosorbide dinitrate (ISORDIL) 30 MG tablet TAKE 1 TABLET BY MOUTH DAILY  . nitroGLYCERIN (NITROSTAT) 0.4 MG SL tablet Place 1 tablet (  0.4 mg total) under the tongue every 5  (five) minutes as needed for chest pain.  Marland Kitchen PAIN MANAGEMENT IT PUMP REFILL 1 each by Intrathecal route once. Medication: PF Fentanyl 500.0 mcg/ml PF Baclofen 300.49mg/ml PF Bupivicaine 20.0 mg/ml Total Volume: 40 ml Needed by 09-20-15 @ 1440  . PARoxetine (PAXIL) 20 MG tablet TAKE ONE-HALF (1/2) OF A TABLET BY MOUTHTWICE DAILY  . rosuvastatin (CRESTOR) 20 MG tablet Take 1 tablet (20 mg total) by mouth daily.  .Marland Kitchensulfamethoxazole-trimethoprim (BACTRIM DS,SEPTRA DS) 800-160 MG tablet Take 2 tablets by mouth 2 (two) times daily.  . polyethylene glycol (MIRALAX / GLYCOLAX) packet Take 17 g by mouth daily.   No current facility-administered medications on file prior to visit.    ROS  Constitutional: Afebrile, no chills, well hydrated and well nourished Gastrointestinal: No upper or lower GI bleeding, no nausea, no vomiting and no acute GI distress Musculoskeletal: No acute joint swelling or redness, no acute loss of range of motion and no acute onset weakness Neurological: Denies any acute onset apraxia, no episodes of paralysis, no acute loss of coordination, no acute loss of consciousness and no acute onset aphasia, dysarthria, agnosia, or amnesia  Allergies  Ms. CMied[place] No Known Allergies.  [facility]:  Melinda [name] has a past medical history of Degenerative disc disease; Spinal stenosis; Facet joint disease; Bulging disc; MRSA (methicillin resistant staph aureus) culture positive (2011); Fibromyalgia; Peripheral neuropathy ([place]; MCL deficiency, knee; Coronary arteritis; CAD (coronary artery disease) (2009); Hyperlipidemia; Heart disease; Cardiac arrhythmia due to congenital heart disease; Chicken pox; Anemia; Sleep apnea; Neuropathy ([place] (04/18/2010); and Diabetes mellitus without complication ([place]. Family: family history includes Cancer in her mother and paternal grandfather; Diabetes in her father and paternal grandmother; Heart disease in her maternal grandfather. There is no  history of Stroke. Surgical:  has past surgical history that includes Foot surgery (Right); Hand surgery (Right); Gallbladder surgery; Ablation; Ablation; HEART STENT (2009); HAND SURGEY (Left); Foot surgery (Bilateral); Foot surgery (Right); Infusion pump implantation; Eye surgery (Bilateral); Back surgery; Fracture surgery (Right, 2012); Cholecystectomy (2003); and Anterior cervical decomp/discectomy fusion (N/A, 07/28/2014). Tobacco:  reports that she quit smoking about 8 years ago. Her smoking use included Cigarettes. She has a 48 pack-year smoking history. She has never used smokeless tobacco. Alcohol:  reports that she drinks alcohol. Drug:  reports that she does not use illicit drugs.  Physical Examination  Constitutional Vitals:  Today's Vitals   10/18/15 1540 10/18/15 1543  BP: 134/86   Pulse: 79   Temp: 98.6 F (37 C)   TempSrc: Oral   Resp: 18   Height: _0  (1.753 m)   Weight: 228 lb (103.42 kg)   SpO2: 98%   PainSc:  2    Calculated BMI: Body mass index is 33.65 kg/(m^2).       General appearance: alert, cooperative, in no distress, mildly obese, well nourished and well hydrated Eyes: PERLA Respiratory: No evidence respiratory distress, no audible rales or ronchi and no use of accessory muscles of respiration Psych: Alert, oriented to person, oriented to place and oriented to time  Cervical Spine Exam  Inspection: Normal anatomy, no anomalies observed Cervical Lordosis: Normal Alignment: Symetrical Functional ROM: Within functional limits ([facility] AROM: WFL Sensory: No sensory anomalies reported or detected  Upper Extremity Exam    Right  Left  Inspection: No gross anomalies detected  Inspection: No gross anomalies detected  Functional ROM: Adequate  Functional ROM: Adequate  AROM: Adequate  AROM: Adequate  Sensory: No sensory anomalies reported or detected  Sensory: No sensory anomalies reported or detected  Motor: Unremarkable  Motor: Unremarkable  Vascular:  Normal skin color, temperature, and hair growth. No peripheral edema or cyanosis  Vascular: Normal skin color, temperature, and hair growth. No peripheral edema or cyanosis   Thoracic Spine  Inspection: No gross anomalies detected Alignment: Symetrical Functional ROM: Within functional limits [facility]) AROM: Adequate Palpation: WNL  Lumbar Spine  Inspection: No gross anomalies detected Alignment: Symetrical Functional ROM: Within functional limits [facility]) AROM: Adequate Sensory: No sensory anomalies reported or detected Palpation: WNL Provocative Tests: Lumbar Hyperextension and rotation test: deferred Patrick's Maneuver: deferred  Gait Assessment  Gait: antalgic gait or posture.  Lower Extremities    Right  Left  Inspection: Some swelling detected on the right knee.  Inspection: No gross anomalies detected  Functional ROM: Adequate  Functional ROM: Within functional limits [facility])  AROM: Decreased4 right knee.  AROM: Adequate  Sensory: No sensory anomalies reported or detected  Sensory: No sensory anomalies reported or detected  Motor: Grossly normal  Motor: Unremarkable  Toe walk (S1): WNL  Toe walk (S1): WNL  Heal walk (L5): WNL  Heal walk (L5): WNL   Assessment & Plan  Primary Diagnosis & Pertinent Problem List: The primary encounter diagnosis was Chronic pain. Diagnoses of Encounter for therapeutic drug level monitoring, Long term current use of opiate analgesic, Chronic knee pain (Left), Chronic neck pain, Fibromyalgia, and Opiate use (22.5 MME/Day) (oral) were also pertinent to this visit.  Visit Diagnosis: 1. Chronic pain   2. Encounter for therapeutic drug level monitoring   3. Long term current use of opiate analgesic   4. Chronic knee pain (Left)   5. Chronic neck pain   6. Fibromyalgia   7. Opiate use (22.5 MME/Day) (oral)     Problems updated and reviewed during this visit: Problem  Opiate use (22.5 MME/Day) (oral)    Problem-specific Plan(s): No  problem-specific assessment & plan notes found for this encounter.  No new assessment & plan notes have been filed under this hospital service since the last note was generated. Service: Pain Management   Plan of Care   Problem List Items Addressed This Visit      High   Chronic knee pain (Left) (Chronic)   Relevant Medications   oxyCODONE (OXY IR/ROXICODONE) 5 MG immediate release tablet   tiZANidine (ZANAFLEX) 4 MG tablet   pregabalin (LYRICA) 100 MG capsule   oxyCODONE (OXY IR/ROXICODONE) 5 MG immediate release tablet   oxyCODONE (OXY IR/ROXICODONE) 5 MG immediate release tablet   Chronic neck pain (Chronic)   Relevant Medications   oxyCODONE (OXY IR/ROXICODONE) 5 MG immediate release tablet   tiZANidine (ZANAFLEX) 4 MG tablet   pregabalin (LYRICA) 100 MG capsule   oxyCODONE (OXY IR/ROXICODONE) 5 MG immediate release tablet   oxyCODONE (OXY IR/ROXICODONE) 5 MG immediate release tablet   Chronic pain - Primary (Chronic)   Relevant Medications   oxyCODONE (OXY IR/ROXICODONE) 5 MG immediate release tablet   tiZANidine (ZANAFLEX) 4 MG tablet   pregabalin (LYRICA) 100 MG capsule   oxyCODONE (OXY IR/ROXICODONE) 5 MG immediate release tablet   oxyCODONE (OXY IR/ROXICODONE) 5 MG immediate release tablet   Fibromyalgia (Chronic)   Relevant Medications   pregabalin (LYRICA) 100 MG capsule     Medium   Encounter for therapeutic drug level monitoring   Long term current use of opiate analgesic (Chronic)   Relevant Orders   ToxASSURE Select 13 (MW), Urine  Opiate use (22.5 MME/Day) (oral) (Chronic)       Pharmacotherapy (Medications Ordered): Meds ordered this encounter  Medications  . oxyCODONE (OXY IR/ROXICODONE) 5 MG immediate release tablet    Sig: Take 1 tablet (5 mg total) by mouth every 8 (eight) hours as needed for moderate pain or severe pain.    Dispense:  90 tablet    Refill:  0    Do not place this medication, or any other prescription from our practice, on  "Automatic Refill". Patient may have prescription filled one day early if pharmacy is closed on scheduled refill date. Do not fill until: 10/23/15 To last until: 11/22/15  . tiZANidine (ZANAFLEX) 4 MG tablet    Sig: Take 1 tablet (4 mg total) by mouth every 6 (six) hours as needed.    Dispense:  120 tablet    Refill:  2    Do not place this medication, or any other prescription from our practice, on "Automatic Refill". Patient may have prescription filled one day early if pharmacy is closed on scheduled refill date.  . pregabalin (LYRICA) 100 MG capsule    Sig: Take 1 capsule (100 mg total) by mouth 3 (three) times daily.    Dispense:  90 capsule    Refill:  2    Do not place this medication, or any other prescription from our practice, on "Automatic Refill". Patient may have prescription filled one day early if pharmacy is closed on scheduled refill date.  Marland Kitchen oxyCODONE (OXY IR/ROXICODONE) 5 MG immediate release tablet    Sig: Take 1 tablet (5 mg total) by mouth every 8 (eight) hours as needed for severe pain.    Dispense:  90 tablet    Refill:  0    Do not place this medication, or any other prescription from our practice, on "Automatic Refill". Patient may have prescription filled one day early if pharmacy is closed on scheduled refill date. Do not fill until: 11/22/15 To last until: 12/22/15  . oxyCODONE (OXY IR/ROXICODONE) 5 MG immediate release tablet    Sig: Take 1 tablet (5 mg total) by mouth every 8 (eight) hours as needed for severe pain.    Dispense:  90 tablet    Refill:  0    Do not place this medication, or any other prescription from our practice, on "Automatic Refill". Patient may have prescription filled one day early if pharmacy is closed on scheduled refill date. Do not fill until: 12/22/15 To last until: 01/21/16   [facility] & Procedure Ordered: Orders Placed This Encounter  Procedures  . ToxASSURE Select 13 (MW), Urine    Imaging Ordered: None  Interventional  Therapies: Scheduled:  Intrathecal pump refill.    Considering:  Right knee genicular nerve blocks under fluoroscopic guidance, with or without sedation the pending of her choice.    PRN Procedures:  None at this point.    Referral(s) or Consult(s): None at this time.  New Prescriptions   OXYCODONE (OXY IR/ROXICODONE) 5 MG IMMEDIATE RELEASE TABLET    Take 1 tablet (5 mg total) by mouth every 8 (eight) hours as needed for severe pain.   OXYCODONE (OXY IR/ROXICODONE) 5 MG IMMEDIATE RELEASE TABLET    Take 1 tablet (5 mg total) by mouth every 8 (eight) hours as needed for severe pain.    Medications administered during this visit: Melinda [name] had no medications administered during this visit.  Future Appointments Date Time Provider [place]  01/17/2016 2:00 PM [name], MD [facility] [place]  None    Primary Care Physician: [name], NP Location: [facility] Note by: Kathlen Brunswick. Dossie Arbour, M.D, DABA, DABAPM, DABPM, DABIPP, FIPP  Pain Score Disclaimer: We use the NRS-11 scale. This is a self-reported, subjective measurement of pain severity with only modest accuracy. It is used primarily to identify changes within a particular patient. It must be understood that outpatient pain scales are significantly less accurate that those used for research, where they can be applied under ideal controlled circumstances with minimal exposure to variables. In reality, the score is likely to be a combination of pain intensity and pain affect, where pain affect describes the degree of emotional arousal or changes in action readiness caused by the sensory experience of pain. Factors such as social and work situation, setting, emotional state, anxiety levels, expectation, and prior pain experience may influence pain perception and show large inter-individual differences that may also be affected by time variables.

## 2015-10-19 NOTE — Telephone Encounter (Signed)
Left detailed message on voicemail (per DPR) 

## 2015-10-21 ENCOUNTER — Telehealth: Payer: Self-pay | Admitting: Cardiovascular Disease

## 2015-10-21 NOTE — Telephone Encounter (Signed)
ERROR

## 2015-10-24 ENCOUNTER — Other Ambulatory Visit: Payer: Self-pay | Admitting: Internal Medicine

## 2015-10-24 ENCOUNTER — Telehealth: Payer: Self-pay | Admitting: Internal Medicine

## 2015-10-24 DIAGNOSIS — E1142 Type 2 diabetes mellitus with diabetic polyneuropathy: Secondary | ICD-10-CM

## 2015-10-24 DIAGNOSIS — M79676 Pain in unspecified toe(s): Secondary | ICD-10-CM

## 2015-10-24 NOTE — Telephone Encounter (Signed)
Pt states she was able to get the nail removed herself, but reports there is still some redness, does not feel hot to the touch, and noticed some fluid build up---took last Ax yesterday wants referral for Podiatry--does not want to go to [facility]---pt wants to know if there is anything else she needs to do in the meantime--please advise

## 2015-10-24 NOTE — Telephone Encounter (Signed)
Please see mychart message below:  Hi, I saw Rollene Fare for a skin infection around the big toe on my left foot last week. She put me on antibiotics, of which I have one left of. Since then the nail has lifted off the nail bed on one side but seems still attached on the other. now the redness around the base of the toe is worse and there is a small amount of blood and pus coming from where a tear in the skin has opened up.   Right now, to keep the nail from catching on socks, clothes or sheets, I have to keep take on it, which I think might be breaking down the skin.I think it needs to be looked at and possibly the nail removed. I have already lost one big toe, I would like to try and save the other one.  Thanks, Melinda [name]  Please advise

## 2015-10-24 NOTE — Telephone Encounter (Signed)
Does she have a podiatrist? I do not remove toenails.

## 2015-10-24 NOTE — Telephone Encounter (Signed)
I would soak the foot daily in epsom salt. Referral for podiatry placed

## 2015-10-25 NOTE — Telephone Encounter (Signed)
Pt is aware as instructed 

## 2015-10-27 ENCOUNTER — Other Ambulatory Visit: Payer: Self-pay | Admitting: Pain Medicine

## 2015-10-27 LAB — TOXASSURE SELECT 13 (MW), URINE: PDF: 0

## 2015-11-06 NOTE — Progress Notes (Signed)
Quick Note:  NOTE: This forensic urine drug screen (UDS) test was conducted using a state-of-the-art ultra high performance liquid chromatography and mass spectrometry system (UPLC/MS-MS), the most sophisticated and accurate method available. UPLC/MS-MS is 1,000 times more precise and accurate than standard gas chromatography and mass spectrometry (GC/MS). This system can analyze 26 drug categories and 180 drug compounds.  Results reviewed. Anomalies determined to be benign. Unexpected results determined to be associated to miscommunication during the recording of medications taken within the last 72 hours. ______

## 2015-11-10 DIAGNOSIS — Z89421 Acquired absence of other right toe(s): Secondary | ICD-10-CM | POA: Diagnosis not present

## 2015-11-10 DIAGNOSIS — E1142 Type 2 diabetes mellitus with diabetic polyneuropathy: Secondary | ICD-10-CM | POA: Diagnosis not present

## 2015-11-10 DIAGNOSIS — B351 Tinea unguium: Secondary | ICD-10-CM | POA: Diagnosis not present

## 2015-11-21 DIAGNOSIS — M1711 Unilateral primary osteoarthritis, right knee: Secondary | ICD-10-CM | POA: Diagnosis not present

## 2015-11-25 ENCOUNTER — Telehealth: Payer: Self-pay | Admitting: *Deleted

## 2015-11-25 NOTE — Telephone Encounter (Signed)
sw pt gave appt for 01/17/16 @ 11am insteadof 2pm. Pt is aware to come in on 01/17/16@ 11am...td

## 2015-11-30 DIAGNOSIS — M1711 Unilateral primary osteoarthritis, right knee: Secondary | ICD-10-CM | POA: Diagnosis not present

## 2015-12-01 ENCOUNTER — Other Ambulatory Visit: Payer: Self-pay | Admitting: Internal Medicine

## 2015-12-02 ENCOUNTER — Telehealth: Payer: Self-pay

## 2015-12-02 ENCOUNTER — Other Ambulatory Visit: Payer: Self-pay | Admitting: Internal Medicine

## 2015-12-02 NOTE — Telephone Encounter (Signed)
Spoke with Ronalee Belts from Dr Silvano Rusk office, orthopedics.  States that she had an orthoscopic procedure and she was having additional pain.  Provider aware of patients pain pump as well as her existing Rx for oxycodone 5 mg q 8 hours. He is going to increase to write for oxycodone 5 mg q 6 - 8 hours to handle the acute pain.  Will Send [name] a note of procedures and plan.

## 2015-12-02 NOTE — Telephone Encounter (Signed)
Lauranne called, she picked up script from Dr. Jerald Kief office and wanted to let us know. It is percocet 5/325 1 po q 4-6 hrs for pain. 40 tabs no refills

## 2015-12-02 NOTE — Telephone Encounter (Signed)
Ave is requesting narcotics from Dr. Silvano Rusk office. She told him that if he just called Dr. Dossie Arbour that it would be ok for her to get meds from them. Please call Ronalee Belts from that office, you can leave a message if he doesn't answer, and let him know if it is ok for him to give her narcotics. [phone]

## 2015-12-07 DIAGNOSIS — M1711 Unilateral primary osteoarthritis, right knee: Secondary | ICD-10-CM | POA: Diagnosis not present

## 2015-12-08 ENCOUNTER — Encounter: Payer: Self-pay | Admitting: Internal Medicine

## 2015-12-08 ENCOUNTER — Ambulatory Visit (INDEPENDENT_AMBULATORY_CARE_PROVIDER_SITE_OTHER): Payer: Medicare Other | Admitting: Internal Medicine

## 2015-12-08 VITALS — BP 128/70 | HR 78 | Temp 99.0°F | Wt 229.0 lb

## 2015-12-08 DIAGNOSIS — L98491 Non-pressure chronic ulcer of skin of other sites limited to breakdown of skin: Secondary | ICD-10-CM | POA: Diagnosis not present

## 2015-12-08 DIAGNOSIS — I2510 Atherosclerotic heart disease of native coronary artery without angina pectoris: Secondary | ICD-10-CM | POA: Diagnosis not present

## 2015-12-08 DIAGNOSIS — L089 Local infection of the skin and subcutaneous tissue, unspecified: Secondary | ICD-10-CM

## 2015-12-08 MED ORDER — CEPHALEXIN 500 MG PO CAPS: 500.0000 mg | ORAL_CAPSULE | Freq: Three times a day (TID) | ORAL | Status: DC

## 2015-12-08 NOTE — Progress Notes (Signed)
Subjective:    Patient ID: Melinda [name], female    DOB: 09-Sep-1955, 60 y.o.   MRN: [id number]  HPI  Pt presents to the clinic today for swelling and redness of her right upper ear. She noticed this 3 days ago. She has noticed yellow drainage from the area. She denies fever, chill or body aches. She has not tried anything OTC.   Review of Systems      Past Medical History  Diagnosis Date  . Degenerative disc disease   . Spinal stenosis   . Facet joint disease   . Bulging disc   . MRSA (methicillin resistant staph aureus) culture positive 2011    GREAT TOE RIGHT FOOT  . Fibromyalgia   . Peripheral neuropathy ([place])   . MCL deficiency, knee   . Coronary arteritis   . CAD (coronary artery disease) 2009    s/p stent to LAD  . Hyperlipidemia   . Heart disease   . 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 Chicken pox   . Anemia     years ago  . Sleep apnea     uses CPAP, [facility] at [facility]  . Neuropathy ([place]) 04/18/2010  . Diabetes mellitus without complication [facility])     Current Outpatient Prescriptions  Medication Sig Dispense Refill  . ACCU-CHEK AVIVA PLUS test strip TEST 2 TIMES DAY AS NEEDED **E11.9** 100 each 6  . ACCU-CHEK SOFTCLIX LANCETS lancets 1 each by Other route 2 (two) times daily. Use as instructed 100 each 2  . AMBULATORY NON FORMULARY MEDICATION Medication Name: CPAP MASK OF CHOICE FOR HOME DEVICE 1 each 0  . aspirin 81 MG tablet Take 81 mg by mouth daily.    . Blood Glucose Calibration (ACCU-CHEK AVIVA) SOLN 1 each by In Vitro route as needed. (Patient taking differently: 1 each by Other route See admin instructions. Check blood sugar once daily.) 1 each 1  . Blood Glucose Monitoring Suppl (ACCU-CHEK AVIVA PLUS) W/DEVICE KIT 1 Device by Does not apply route once. (Patient taking differently: 1 each by Other route See admin instructions. Check blood sugar once daily.) 1 kit 0  . buPROPion (WELLBUTRIN XL) 150 MG 24 hr tablet TAKE 1  TABLET BY MOUTH DAILY 90 tablet 2  . ibuprofen (ADVIL,MOTRIN) 200 MG tablet Take 400 mg by mouth every 6 (six) hours as needed.    . isosorbide dinitrate (ISORDIL) 30 MG tablet TAKE 1 TABLET BY MOUTH DAILY 30 tablet 8  . nitroGLYCERIN (NITROSTAT) 0.4 MG SL tablet Place 1 tablet (0.4 mg total) under the tongue every 5 (five) minutes as needed for chest pain. 25 tablet 2  . oxyCODONE (OXY IR/ROXICODONE) 5 MG immediate release tablet Take 1 tablet (5 mg total) by mouth every 8 (eight) hours as needed for moderate pain or severe pain. 90 tablet 0  . oxyCODONE (OXY IR/ROXICODONE) 5 MG immediate release tablet Take 1 tablet (5 mg total) by mouth every 8 (eight) hours as needed for severe pain. 90 tablet 0  . oxyCODONE (OXY IR/ROXICODONE) 5 MG immediate release tablet Take 1 tablet (5 mg total) by mouth every 8 (eight) hours as needed for severe pain. 90 tablet 0  . oxyCODONE-acetaminophen (PERCOCET/ROXICET) 5-325 MG tablet Take 1 tablet by mouth every 4 (four) hours as needed for severe pain.    Marland Kitchen PAIN MANAGEMENT IT PUMP REFILL 1 each by Intrathecal route once. Medication: PF Fentanyl 500.0 mcg/ml PF Baclofen 300.89mg/ml PF Bupivicaine 20.0 mg/ml Total  Volume: 40 ml Needed by 09-20-15 @ 1440 1 each 0  . PARoxetine (PAXIL) 20 MG tablet TAKE ONE-HALF (1/2) OF A TABLET BY MOUTHTWICE DAILY 90 tablet 0  . polyethylene glycol (MIRALAX / GLYCOLAX) packet Take 17 g by mouth daily.    . rosuvastatin (CRESTOR) 20 MG tablet Take 1 tablet (20 mg total) by mouth daily. 30 tablet 7  . sulfamethoxazole-trimethoprim (BACTRIM DS,SEPTRA DS) 800-160 MG tablet Take 2 tablets by mouth 2 (two) times daily. 40 tablet 0  . tiZANidine (ZANAFLEX) 4 MG tablet Take 1 tablet (4 mg total) by mouth every 6 (six) hours as needed. 120 tablet 2  . pregabalin (LYRICA) 100 MG capsule Take 1 capsule (100 mg total) by mouth 3 (three) times daily. 90 capsule 2   No current facility-administered medications for this visit.    No Known  Allergies  Family History  Problem Relation Age of Onset  . Cancer Mother     lung  . Diabetes Father   . Heart disease Maternal Grandfather   . Diabetes Paternal Grandmother   . Cancer Paternal Grandfather     colon  . Stroke Neg Hx     Social History   Social History  . Marital Status: Significant Other    Spouse Name: N/A  . Number of Children: N/A  . Years of Education: N/A   Occupational History  . Not on file.   Social History Main Topics  . Smoking status: Former Smoker -- 1.50 packs/day for 32 years    Types: Cigarettes    Quit date: 07/22/2007  . Smokeless tobacco: Never Used  . Alcohol Use: 0.0 oz/week    0 Standard drinks or equivalent per week     Comment: occ  . Drug Use: No  . Sexual Activity: Yes   Other Topics Concern  . Not on file   Social History Narrative     Constitutional: Denies fever, malaise, fatigue, headache or abrupt weight changes.  HEENT: Pt reports right ear redness, swelling and drainage. Denies eye pain, eye redness, ringing in the ears, wax buildup, runny nose, nasal congestion, bloody nose, or sore throat. Respiratory: Denies difficulty breathing, shortness of breath, cough or sputum production.    No other specific complaints in a complete review of systems (except as listed in HPI above).  Objective:   Physical Exam   BP 128/70 mmHg  Pulse 78  Temp(Src) 99 F (37.2 C) (Oral)  Wt 229 lb (103.874 kg)  SpO2 95% Wt Readings from Last 3 Encounters:  12/08/15 229 lb (103.874 kg)  10/18/15 228 lb (103.42 kg)  10/12/15 231 lb 8 oz (105.008 kg)    General: Appears her stated age, well developed, well nourished in NAD. Skin: Warm, dry and intact. Small ulceration noted to right pinna, surrounding redness and warmth.  BMET    Component Value Date/Time   NA 140 09/10/2015 1751   NA 135* 06/30/2013 1203   K 4.6 09/10/2015 1751   K 4.1 06/30/2013 1203   CL 102 09/10/2015 1751   CL 102 06/30/2013 1203   CO2 27  09/10/2015 1751   CO2 32 06/30/2013 1203   GLUCOSE 143* 09/10/2015 1751   GLUCOSE 143* 06/30/2013 1203   BUN 25* 09/10/2015 1751   BUN 19* 06/30/2013 1203   CREATININE 1.00 09/10/2015 1751   CREATININE 0.89 06/30/2013 1203   CALCIUM 9.1 09/10/2015 1751   CALCIUM 9.7 06/30/2013 1203   GFRNONAA >60 09/10/2015 1751   GFRNONAA >60 06/30/2013 1203  GFRAA >60 09/10/2015 1751   GFRAA >60 06/30/2013 1203    Lipid Panel     Component Value Date/Time   CHOL 120 09/11/2015 0421   TRIG 86 09/11/2015 0421   HDL 41 09/11/2015 0421   CHOLHDL 2.9 09/11/2015 0421   VLDL 17 09/11/2015 0421   LDLCALC 62 09/11/2015 0421    CBC    Component Value Date/Time   WBC 9.6 09/10/2015 1751   WBC 10.6 11/12/2011 1136   RBC 4.49 09/10/2015 1751   RBC 3.47* 10/05/2013 0805   RBC 4.75 11/12/2011 1136   HGB 12.7 09/10/2015 1751   HGB 14.0 11/12/2011 1136   HCT 38.9 09/10/2015 1751   HCT 40.7 11/12/2011 1136   PLT 206 09/10/2015 1751   PLT 274 11/12/2011 1136   MCV 86.6 09/10/2015 1751   MCV 86 11/12/2011 1136   MCH 28.3 09/10/2015 1751   MCH 29.5 11/12/2011 1136   MCHC 32.6 09/10/2015 1751   MCHC 34.5 11/12/2011 1136   RDW 12.8 09/10/2015 1751   RDW 12.8 11/12/2011 1136   LYMPHSABS 1.7 07/21/2014 1340   LYMPHSABS 2.5 11/12/2011 1136   MONOABS 0.7 07/21/2014 1340   MONOABS 0.8 11/12/2011 1136   EOSABS 0.3 07/21/2014 1340   EOSABS 0.6 11/12/2011 1136   BASOSABS 0.0 07/21/2014 1340   BASOSABS 0.1 11/12/2011 1136    Hgb A1C Lab Results  Component Value Date   HGBA1C 6.3* 09/11/2015        Assessment & Plan:   Infected ulcer, right ear:  Wash with soap and water eRx for Keflex 500 mg TID x 10 days Return precautions given  RTC as needed or if symptoms persist or worsen

## 2015-12-08 NOTE — Patient Instructions (Signed)

## 2015-12-16 DIAGNOSIS — M1711 Unilateral primary osteoarthritis, right knee: Secondary | ICD-10-CM | POA: Diagnosis not present

## 2015-12-19 ENCOUNTER — Telehealth: Payer: Self-pay | Admitting: *Deleted

## 2015-12-19 NOTE — Telephone Encounter (Signed)
Pt lmv stating that her Ortho doctor gave her a prescription for pain meds and she wanted to make the doctor here aware....thanks

## 2015-12-26 DIAGNOSIS — M1711 Unilateral primary osteoarthritis, right knee: Secondary | ICD-10-CM | POA: Diagnosis not present

## 2015-12-28 ENCOUNTER — Telehealth: Payer: Self-pay

## 2015-12-28 NOTE — Telephone Encounter (Signed)
Patient wants to know if she can use CBD oil- which she says is cannabis based-  her knee pain. Per Dr. Dossie Arbour NO. Patient to follow up with orthopedic as scheduled in 2 weeks and understands that Dr. Dossie Arbour has no appointments until 01/19/2016-her regular appointment.

## 2015-12-28 NOTE — Telephone Encounter (Signed)
Pt is having pain in her knee it is hard for her to sit in a chair with a hard seat. Pt is scheduled to come in 7/20

## 2016-01-06 ENCOUNTER — Ambulatory Visit (INDEPENDENT_AMBULATORY_CARE_PROVIDER_SITE_OTHER): Payer: Medicare Other | Admitting: Internal Medicine

## 2016-01-06 ENCOUNTER — Encounter: Payer: Self-pay | Admitting: Internal Medicine

## 2016-01-06 VITALS — BP 100/60 | HR 80 | Temp 98.2°F | Wt 222.0 lb

## 2016-01-06 DIAGNOSIS — I2510 Atherosclerotic heart disease of native coronary artery without angina pectoris: Secondary | ICD-10-CM

## 2016-01-06 DIAGNOSIS — L255 Unspecified contact dermatitis due to plants, except food: Secondary | ICD-10-CM

## 2016-01-06 MED ORDER — TRIAMCINOLONE ACETONIDE 0.1 % EX CREA: 1.0000 "application " | TOPICAL_CREAM | Freq: Two times a day (BID) | CUTANEOUS | Status: DC | PRN

## 2016-01-06 MED ORDER — METHYLPREDNISOLONE ACETATE 80 MG/ML IJ SUSP
80.0000 mg | Freq: Once | INTRAMUSCULAR | Status: AC
  Administered 2016-01-06: 80 mg via INTRAMUSCULAR

## 2016-01-06 MED ORDER — PREDNISONE 20 MG PO TABS: 40.0000 mg | ORAL_TABLET | Freq: Every day | ORAL | Status: DC

## 2016-01-06 NOTE — Progress Notes (Signed)
Subjective:    Patient ID: Melinda [name], female    DOB: 1956-03-24, 60 y.o.   MRN: [id number]  HPI Here due to poison ivy  Has bad rash especially on right arm and hand Some on right thigh and left arm Now with rash over abdomen--overlying her pain pump Very itchy  Was out in back yard trimming bushes--last weekend Rash started the next day Worsening Using calamine and benedryl---barely any help  Had blisters --already burst and no more drainage now  Current Outpatient Prescriptions on File Prior to Visit  Medication Sig Dispense Refill  . ACCU-CHEK AVIVA PLUS test strip TEST 2 TIMES DAY AS NEEDED **E11.9** 100 each 6  . ACCU-CHEK SOFTCLIX LANCETS lancets 1 each by Other route 2 (two) times daily. Use as instructed 100 each 2  . AMBULATORY NON FORMULARY MEDICATION Medication Name: CPAP MASK OF CHOICE FOR HOME DEVICE 1 each 0  . aspirin 81 MG tablet Take 81 mg by mouth daily.    . Blood Glucose Calibration (ACCU-CHEK AVIVA) SOLN 1 each by In Vitro route as needed. (Patient taking differently: 1 each by Other route See admin instructions. Check blood sugar once daily.) 1 each 1  . Blood Glucose Monitoring Suppl (ACCU-CHEK AVIVA PLUS) W/DEVICE KIT 1 Device by Does not apply route once. (Patient taking differently: 1 each by Other route See admin instructions. Check blood sugar once daily.) 1 kit 0  . buPROPion (WELLBUTRIN XL) 150 MG 24 hr tablet TAKE 1 TABLET BY MOUTH DAILY 90 tablet 2  . ibuprofen (ADVIL,MOTRIN) 200 MG tablet Take 400 mg by mouth every 6 (six) hours as needed.    . isosorbide dinitrate (ISORDIL) 30 MG tablet TAKE 1 TABLET BY MOUTH DAILY 30 tablet 8  . nitroGLYCERIN (NITROSTAT) 0.4 MG SL tablet Place 1 tablet (0.4 mg total) under the tongue every 5 (five) minutes as needed for chest pain. 25 tablet 2  . oxyCODONE (OXY IR/ROXICODONE) 5 MG immediate release tablet Take 1 tablet (5 mg total) by mouth every 8 (eight) hours as needed for moderate pain or severe pain. 90  tablet 0  . oxyCODONE (OXY IR/ROXICODONE) 5 MG immediate release tablet Take 1 tablet (5 mg total) by mouth every 8 (eight) hours as needed for severe pain. 90 tablet 0  . oxyCODONE (OXY IR/ROXICODONE) 5 MG immediate release tablet Take 1 tablet (5 mg total) by mouth every 8 (eight) hours as needed for severe pain. 90 tablet 0  . oxyCODONE-acetaminophen (PERCOCET/ROXICET) 5-325 MG tablet Take 1 tablet by mouth every 4 (four) hours as needed for severe pain.    Marland Kitchen PAIN MANAGEMENT IT PUMP REFILL 1 each by Intrathecal route once. Medication: PF Fentanyl 500.0 mcg/ml PF Baclofen 300.23mg/ml PF Bupivicaine 20.0 mg/ml Total Volume: 40 ml Needed by 09-20-15 @ 1440 1 each 0  . PARoxetine (PAXIL) 20 MG tablet TAKE ONE-HALF (1/2) OF A TABLET BY MOUTHTWICE DAILY 90 tablet 0  . polyethylene glycol (MIRALAX / GLYCOLAX) packet Take 17 g by mouth daily.    . pregabalin (LYRICA) 100 MG capsule Take 1 capsule (100 mg total) by mouth 3 (three) times daily. 90 capsule 2  . rosuvastatin (CRESTOR) 20 MG tablet Take 1 tablet (20 mg total) by mouth daily. 30 tablet 7  . tiZANidine (ZANAFLEX) 4 MG tablet Take 1 tablet (4 mg total) by mouth every 6 (six) hours as needed. 120 tablet 2   No current facility-administered medications on file prior to visit.    No Known Allergies  Past Medical History  Diagnosis Date  . Degenerative disc disease   . Spinal stenosis   . Facet joint disease   . Bulging disc   . MRSA (methicillin resistant staph aureus) culture positive 2011    GREAT TOE RIGHT FOOT  . Fibromyalgia   . Peripheral neuropathy ([place])   . MCL deficiency, knee   . Coronary arteritis   . CAD (coronary artery disease) 2009    s/p stent to LAD  . Hyperlipidemia   . Heart disease   . 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 Chicken pox   . Anemia     years ago  . Sleep apnea     uses CPAP, [facility] at [facility]  . Neuropathy ([place]) 04/18/2010  . Diabetes mellitus without  complication [facility])     Past Surgical History  Procedure Laterality Date  . Foot surgery Right     BIG TOE  . Hand surgery Right   . Gallbladder surgery    . Ablation      UTERUS  . Ablation      HEART  . Heart stent  2009    LAD  . Hand surgey Left   . Foot surgery Bilateral     PLANTAR FASCIITIS  . Foot surgery Right     2ND TOE  . Infusion pump implantation      X2 with morphine and baclofen  . Eye surgery Bilateral   . Back surgery      X 3 1979, 1994, 1995  . Fracture surgery Right 2012    carpal tunnel  . Cholecystectomy  2003  . Anterior cervical decomp/discectomy fusion N/A 07/28/2014    Procedure: ANTERIOR CERVICAL DECOMPRESSION/DISCECTOMY FUSION CERVICAL 3-4,4-5,5-6 LEVELS WITH INSTRUMENTATION AND ALLOGRAFT;  Surgeon: Sinclair Ship, MD;  Location: [facility];  Service: Orthopedics;  Laterality: N/A;  Anterior cervical decompression fusion, cervical 3-4, cervical 4-5, cervical 5-6 with instrumentation and allograft    Family History  Problem Relation Age of Onset  . Cancer Mother     lung  . Diabetes Father   . Heart disease Maternal Grandfather   . Diabetes Paternal Grandmother   . Cancer Paternal Grandfather     colon  . Stroke Neg Hx     Social History   Social History  . Marital Status: Significant Other    Spouse Name: N/A  . Number of Children: N/A  . Years of Education: N/A   Occupational History  . Not on file.   Social History Main Topics  . Smoking status: Former Smoker -- 1.50 packs/day for 32 years    Types: Cigarettes    Quit date: 07/22/2007  . Smokeless tobacco: Never Used  . Alcohol Use: 0.0 oz/week    0 Standard drinks or equivalent per week     Comment: occ  . Drug Use: No  . Sexual Activity: Yes   Other Topics Concern  . Not on file   Social History Narrative   Review of Systems No fever Doesn't feel sick    Objective:   Physical Exam  Musculoskeletal:  Classic papulovesicular rash worse on right forearm and  scattered on abdomen, legs and left arm          Assessment & Plan:

## 2016-01-06 NOTE — Progress Notes (Signed)
Pre visit review using our clinic review tool, if applicable. No additional management support is needed unless otherwise documented below in the visit note. 

## 2016-01-06 NOTE — Assessment & Plan Note (Signed)
Classic presentation Severe itching so will give some prednisone TAC depomedrol due to her severe symptoms Continue benedryl at night Discussed care with sugars, etc on the prednisone

## 2016-01-06 NOTE — Addendum Note (Signed)
Addended by: Pilar Grammes on: 01/06/2016 09:46 AM   Modules accepted: Orders

## 2016-01-09 ENCOUNTER — Other Ambulatory Visit: Payer: Self-pay

## 2016-01-09 DIAGNOSIS — M1711 Unilateral primary osteoarthritis, right knee: Secondary | ICD-10-CM | POA: Diagnosis not present

## 2016-01-09 MED ORDER — PAIN MANAGEMENT IT PUMP REFILL: 1.0000 | Freq: Once | INTRATHECAL | Status: DC

## 2016-01-16 ENCOUNTER — Other Ambulatory Visit: Payer: Self-pay

## 2016-01-16 MED ORDER — PAIN MANAGEMENT IT PUMP REFILL: 1.0000 | Freq: Once | INTRATHECAL | Status: DC

## 2016-01-17 ENCOUNTER — Encounter: Payer: Medicare Other | Admitting: Pain Medicine

## 2016-01-19 ENCOUNTER — Ambulatory Visit: Payer: Medicare Other | Attending: Pain Medicine | Admitting: Pain Medicine

## 2016-01-19 ENCOUNTER — Telehealth: Payer: Self-pay | Admitting: *Deleted

## 2016-01-19 ENCOUNTER — Encounter: Payer: Self-pay | Admitting: Pain Medicine

## 2016-01-19 VITALS — BP 123/68 | HR 77 | Temp 98.7°F | Resp 18 | Ht 70.0 in | Wt 217.0 lb

## 2016-01-19 DIAGNOSIS — M545 Low back pain: Secondary | ICD-10-CM | POA: Insufficient documentation

## 2016-01-19 DIAGNOSIS — Z981 Arthrodesis status: Secondary | ICD-10-CM | POA: Insufficient documentation

## 2016-01-19 DIAGNOSIS — M47812 Spondylosis without myelopathy or radiculopathy, cervical region: Secondary | ICD-10-CM | POA: Insufficient documentation

## 2016-01-19 DIAGNOSIS — F329 Major depressive disorder, single episode, unspecified: Secondary | ICD-10-CM | POA: Diagnosis not present

## 2016-01-19 DIAGNOSIS — E669 Obesity, unspecified: Secondary | ICD-10-CM | POA: Diagnosis not present

## 2016-01-19 DIAGNOSIS — F418 Other specified anxiety disorders: Secondary | ICD-10-CM | POA: Insufficient documentation

## 2016-01-19 DIAGNOSIS — Z89419 Acquired absence of unspecified great toe: Secondary | ICD-10-CM | POA: Diagnosis not present

## 2016-01-19 DIAGNOSIS — I2510 Atherosclerotic heart disease of native coronary artery without angina pectoris: Secondary | ICD-10-CM | POA: Insufficient documentation

## 2016-01-19 DIAGNOSIS — M797 Fibromyalgia: Secondary | ICD-10-CM | POA: Insufficient documentation

## 2016-01-19 DIAGNOSIS — R079 Chest pain, unspecified: Secondary | ICD-10-CM | POA: Diagnosis not present

## 2016-01-19 DIAGNOSIS — G8929 Other chronic pain: Secondary | ICD-10-CM | POA: Diagnosis not present

## 2016-01-19 DIAGNOSIS — M79605 Pain in left leg: Secondary | ICD-10-CM | POA: Diagnosis not present

## 2016-01-19 DIAGNOSIS — Z955 Presence of coronary angioplasty implant and graft: Secondary | ICD-10-CM | POA: Diagnosis not present

## 2016-01-19 DIAGNOSIS — M5416 Radiculopathy, lumbar region: Secondary | ICD-10-CM | POA: Diagnosis not present

## 2016-01-19 DIAGNOSIS — E7800 Pure hypercholesterolemia, unspecified: Secondary | ICD-10-CM | POA: Insufficient documentation

## 2016-01-19 DIAGNOSIS — I739 Peripheral vascular disease, unspecified: Secondary | ICD-10-CM | POA: Diagnosis not present

## 2016-01-19 DIAGNOSIS — Z6839 Body mass index (BMI) 39.0-39.9, adult: Secondary | ICD-10-CM | POA: Insufficient documentation

## 2016-01-19 DIAGNOSIS — L255 Unspecified contact dermatitis due to plants, except food: Secondary | ICD-10-CM | POA: Insufficient documentation

## 2016-01-19 DIAGNOSIS — M542 Cervicalgia: Secondary | ICD-10-CM

## 2016-01-19 DIAGNOSIS — G9619 Other disorders of meninges, not elsewhere classified: Secondary | ICD-10-CM | POA: Diagnosis not present

## 2016-01-19 DIAGNOSIS — M549 Dorsalgia, unspecified: Secondary | ICD-10-CM | POA: Diagnosis present

## 2016-01-19 DIAGNOSIS — E119 Type 2 diabetes mellitus without complications: Secondary | ICD-10-CM | POA: Diagnosis not present

## 2016-01-19 DIAGNOSIS — Z87891 Personal history of nicotine dependence: Secondary | ICD-10-CM | POA: Insufficient documentation

## 2016-01-19 DIAGNOSIS — Z9689 Presence of other specified functional implants: Secondary | ICD-10-CM | POA: Diagnosis not present

## 2016-01-19 DIAGNOSIS — K5903 Drug induced constipation: Secondary | ICD-10-CM | POA: Insufficient documentation

## 2016-01-19 DIAGNOSIS — I6529 Occlusion and stenosis of unspecified carotid artery: Secondary | ICD-10-CM | POA: Insufficient documentation

## 2016-01-19 DIAGNOSIS — M1712 Unilateral primary osteoarthritis, left knee: Secondary | ICD-10-CM | POA: Insufficient documentation

## 2016-01-19 MED ORDER — OXYCODONE HCL 5 MG PO TABS: 5.0000 mg | ORAL_TABLET | Freq: Three times a day (TID) | ORAL | Status: DC | PRN

## 2016-01-19 MED ORDER — PREGABALIN 100 MG PO CAPS: 100.0000 mg | ORAL_CAPSULE | Freq: Three times a day (TID) | ORAL | Status: DC

## 2016-01-19 MED ORDER — TIZANIDINE HCL 4 MG PO TABS: 4.0000 mg | ORAL_TABLET | Freq: Four times a day (QID) | ORAL | Status: DC | PRN

## 2016-01-19 NOTE — Progress Notes (Signed)
Safety precautions to be maintained throughout the outpatient stay will include: orient to surroundings, keep bed in low position, maintain call bell within reach at all times, provide assistance with transfer out of bed and ambulation. Oxycodone pill count 5 mg  # 51/90  Filled 12-27-15 Percocet 5/325 mg given by Dr Rhona Raider  Roselee Nova, [facility])  #5/40  Filled [phone]

## 2016-01-19 NOTE — Patient Instructions (Signed)
You were given 3 prescriptions for Oxycodone. A script for Tizanidine was sent to your pharmacy.

## 2016-01-19 NOTE — Progress Notes (Signed)
Patient's Name: Melinda [name]  Patient type: Established  MRN: [id number]  Service setting: Ambulatory outpatient  DOB: 03-06-1956  Location: [facility]  DOS: 01/19/2016  Primary Care Physician: [name], NP  Attending Pain Physician: Kathlen Brunswick. Dossie Arbour, M.D, DABA, DABAPM, DABPM, DABIPP, [place]  Referring Physician: Jearld Fenton, NP  Specialty: Board-Certified Interventional Pain Management  Last Visit to Pain Management: 12/28/2015   Primary Reason(s) for Visit: Encounter for Intrathecal Pump Management  (Level of risk: moderate to high) CC: Back Pain  Primary Diagnosis: Chronic neck pain [M54.2, G89.29]   HPI  Melinda [name] is a 60 y.o. year old, female patient, who returns today as an established patient. She has Chronic pain syndrome; CAD (coronary artery disease); Obesity (BMI 30-39.9); Anxiety and depression; Pure hypercholesterolemia; DM2 (diabetes mellitus, type 2) ([place]); Great toe amputation status ([place]); Personal history of spine surgery; Failed back surgical syndrome; Failed cervical surgery syndrome (ACDF); Chronic pain; Chronic neck pain; Chronic low back pain (Bilateral) (L>R); Lumbar spondylosis; Epidural fibrosis; Cervical spondylosis; Cervical facet syndrome; Chronic lower extremity pain (Left); Chronic lumbar radicular pain (Left); Iliotibial band syndrome (Left); Diffuse myofascial pain syndrome; Musculoskeletal pain; Muscle spasms of lower extremities (Bilateral); Muscle spasticity; Neurogenic pain; Neuropathic pain; Lumbar facet syndrome (Bilateral); Fibromyalgia; Long term current use of opiate analgesic; Long term prescription opiate use; Opiate use (22.5 MME/Day) (oral); Opioid dependence ([place]); Encounter for therapeutic drug level monitoring; Encounter for chronic pain management; Presence of functional implant (Medtronic programmable intrathecal pump) (Right abdominal area); Presence of intrathecal pump (Medtronic intrathecal programmable pump)  (40 mL pump); Encounter for interrogation of infusion pump; Encounter for adjustment or management of infusion pump; Therapeutic opioid-induced constipation (OIC); Chronic knee pain (Left); Osteoarthritis of knee (Left); Chest pain; Headache; Depression; HLD (hyperlipidemia); and Contact dermatitis due to plant on her problem list.. Her primarily concern today is the Back Pain   Pain Assessment: Self-Reported Pain Score: 1  Reported level is compatible with observation       Pain Type: Chronic pain Pain Location: Back Pain Orientation: Lower Pain Descriptors / Indicators: Aching Pain Frequency: Constant  The patient comes into the clinics today for pharmacological management of her chronic pain. I last saw this patient on 10/27/2015. The patient  reports that she does not use illicit drugs. Her body mass index is 31.14 kg/(m^2).   The patient brought today an article on CBD marijuana oil to see if it would be okay for her to use that for her pain. I told her that I would repeat the article and do some research about it and then give her my opinion. For now, I do not have enough information to make an intelligent decision either way.  Date of Last Visit: 10/18/15 Service Provided on Last Visit: Med Refill  Intrathecal Pump Therapy Assessment  Pump Type: Programmable Pump Volume: 40 mL reservoir Side-effects or Adverse reactions: None reported Effectiveness: Described as relatively effective, allowing for increase in activities of daily living (ADL) Primary Medication Class: Opioid Primary Opioid: PF-Fentanyl Primary Local Anesthetic: PF-Bupivacaine Medication Change: None at this point Rate Change: No change in rate Infusion Type: Simple continuous Plan: Pump refill today  Procedure:  Intrathecal Drug Delivery System (IDDS):  Type: Reservoir Refill [phone])       Region: Abdominal Laterality: Right   Indications: 1. Chronic neck pain   2. Chronic pain   3. Fibromyalgia     Type  of Pump: Medtronic Synchromed II (MRI-compatible) Delivery Route: Intrathecal Type of Pain  Treated: Neuropathic/Nociceptive Primary Medication Class: Opioid/opiate  Medication, Concentration, Infusion Program, & Delivery Rate: Please see scanned programming printout.   Pre-Procedure:  Verification of the correct person, correct site (including marking of site), and correct procedure were performed and confirmed by the patient.  Consent: Secured. Under the influence of no sedatives a written informed consent was obtained, after having provided information on the risks and possible complications. To fulfill our ethical and legal obligations, as recommended by the American Medical Association's Code of Ethics, we have provided information to the patient about our clinical impression; the nature and purpose of the treatment or procedure; the risks, benefits, and possible complications of the intervention; alternatives; the risk(s) and benefit(s) of the alternative treatment(s) or procedure(s); and the risk(s) and benefit(s) of doing nothing. The patient was provided information about the risks and possible complications associated with the procedure. These include, but are not limited to, failure to achieve desired goals, infection, bleeding, organ or nerve damage, allergic reactions, paralysis, and death. In addition, the patient was informed that Medicine is not an exact science; therefore, there is also the possibility of unforeseen risks and possible complications that may result in a catastrophic outcome. The patient indicated having understood very clearly. We have given the patient no guarantees and we have made no promises. Enough time was given to the patient to ask questions, all of which were answered to the patient's satisfaction.  Consent Attestation: I, the ordering provider, attest that I have discussed with the patient the benefits, risks, side-effects, alternatives, likelihood of achieving  goals, and potential problems during recovery for the procedure that I have provided informed consent.  Pre-Procedure Preparation: Safety Precautions: Allergies reviewed. Appropriate site, procedure, and patient were confirmed by following the Joint Commission's Universal Protocol (UP.01.01.01), in the form of a "Time Out". The patient was asked to confirm marked site and procedure, before commencing. The patient was asked about blood thinners, or active infections, both of which were denied. Patient was assessed for positional comfort and all pressure points were checked before starting procedure. Allergies: She has No Known Allergies.. Infection Control Precautions: Sterile technique used. Standard Universal Precautions were taken as recommended by the Department of [facility] for Disease Control and Prevention (CDC). Standard pre-surgical skin prep was conducted. Respiratory hygiene and cough etiquette was practiced. Hand hygiene observed. Safe injection practices and needle disposal techniques followed. SDV (single dose vial) medications used. Medications properly checked for expiration dates and contaminants. Personal protective equipment (PPE) used: Sterile surgical gloves. Monitoring:  Clinical observation Filed Vitals:   01/19/16 0809 01/19/16 0810  BP:  123/68  Pulse: 77   Temp: 98.7 F (37.1 C)   Resp: 18   Height: '5\' 10"'$  (1.778 m)   Weight: 217 lb (98.431 kg)   SpO2: 96%   Calculated BMI: Body mass index is 31.14 kg/(m^2).  Description of Procedure Process:   Time-out: "Time-out" completed before starting procedure, as per protocol. Position: Supine Target Area: Central-port of intrathecal pump. Approach: Anterior, 90 degree angle approach. Area Prepped: Entire Area around the pump implant. Prepping solution: ChloraPrep (2% chlorhexidine gluconate and 70% isopropyl alcohol) Safety Precautions: Aspiration looking for blood return was conducted prior to all injections.  At no point did we inject any substances, as a needle was being advanced. No attempts were made at seeking any paresthesias. Safe injection practices and needle disposal techniques used. Medications properly checked for expiration dates. SDV (single dose vial) medications used. Description of the Procedure: Protocol guidelines were  followed. Two nurses trained to do implant refills were present during the entire procedure. The refill medication was checked by both healthcare providers as well as the patient. The patient was included in the "Time-out" to verify the medication. The patient was placed in position. The pump was identified. The area was prepped in the usual manner. The sterile template was positioned over the pump, making sure the side-port location matched that of the pump. Both, the pump and the template were held for stability. The needle provided in the Medtronic Kit was then introduced thru the center of the template and into the central port. The pump content was aspirated and discarded volume documented. The new medication was slowly infused into the pump, thru the filter, making sure to avoid overpressure of the device. The needle was then removed and the area cleansed, making sure to leave some of the prepping solution back to take advantage of its long term bactericidal properties. The pump was interrogated and programmed to reflect the correct medication, volume, and dosage. The program was printed and taken to the physician for approval. Once checked and signed by the physician, a copy was provided to the patient and another scanned into the EMR. EBL: None Materials Used: Medtronic Refill Kit  Imaging Guidance:  Type of Imaging Technique: None required.  Antibiotic Prophylaxis:  Indication(s): No indications identified. Type:  Antibiotics Given (last 72 hours)    None       Post-operative Assessment:  Complications: No immediate post-treatment complications were  observed. Disposition: The patient tolerated the entire procedure well. No post-procedural neurological changes observed. The patient was discharged home, once institutional criteria were met. The patient was provided with post-procedure discharge instructions, including a section on how to identify potential problems. Should any problems arise concerning this procedure, the patient was given instructions to immediately contact us, at any time, without hesitation. Comments:  No additional relevant information.  Controlled Substance Pharmacotherapy Assessment & REMS (Risk Evaluation and Mitigation Strategy)  Analgesic: Oxycodone IR 5 mg every 8 hours (15 mg/day) MME/day: 22 mg/day of oral medication. Pill Count: Oxycodone pill count 5 mg # 51/90 Filled 12-27-15. Percocet 5/325 mg given by Dr Rhona Raider Roselee [facility], [facility]) #5/40 Filled [phone]. Additional medication properly reported by patient. Pharmacokinetics: Onset of action (Liberation/Absorption): Within expected pharmacological parameters Time to Peak effect (Distribution): Timing and results are as within normal expected parameters Duration of action (Metabolism/Excretion): Within normal limits for medication Pharmacodynamics: Analgesic Effect: More than 50% Activity Facilitation: Medication(s) allow patient to sit, stand, walk, and do the basic ADLs Perceived Effectiveness: Described as relatively effective, allowing for increase in activities of daily living (ADL) Side-effects or Adverse reactions: None reported Monitoring: [place] PMP: Online review of the past 34-monthperiod conducted. Compliant with practice rules and regulations Last UDS on record: TOXASSURE SELECT 13  Date Value Ref Range Status  10/18/2015 FINAL  Final    Comment:    ==================================================================== TOXASSURE SELECT 13 (MW) ==================================================================== Test                              Result       Flag       Units Drug Present and Declared for Prescription Verification   Oxycodone                      753          EXPECTED   ng/mg creat  Oxymorphone                    98           EXPECTED   ng/mg creat   Noroxycodone                   3344         EXPECTED   ng/mg creat    Sources of oxycodone include scheduled prescription medications.    Oxymorphone and noroxycodone are expected metabolites of    oxycodone. Oxymorphone is also available as a scheduled    prescription medication. Drug Present not Declared for Prescription Verification   Fentanyl                       14           UNEXPECTED ng/mg creat   Norfentanyl                    47           UNEXPECTED ng/mg creat    Source of fentanyl is a scheduled prescription medication,    including IV, patch, and transmucosal formulations. Norfentanyl    is an expected metabolite of fentanyl. ==================================================================== Test                      Result    Flag   Units      Ref Range   Creatinine              246              mg/dL      >=17 ==================================================================== Declared Medications:  The flagging and interpretation on this report are based on the  following declared medications.  Unexpected results may arise from  inaccuracies in the declared medications.  **Note: The testing scope of this panel includes these medications:  Oxycodone  **Note: The testing scope of this panel does not include following  reported medications:  Aspirin  Bupropion (Wellbutrin)  Ibuprofen  Isosorbide  Nitroglycerin  Paroxetine (Paxil)  Polyethylene Glycol  Pregabalin (Lyrica)  Rosuvastatin (Crestor)  Sulfamethoxazole (Trimethoprim-Sulfamethoxazole)  Tizanidine  Trimethoprim (Trimethoprim-Sulfamethoxazole) ==================================================================== For clinical consultation, please call [phone]. ====================================================================    UDS interpretation: Compliant The source of fentanyl is the intrathecal pump. Medication Assessment Form: Reviewed. Patient indicates being compliant with therapy Treatment compliance: Compliant Risk Assessment: Aberrant Behavior: None observed today Substance Use Disorder (SUD) Risk Level: Low-to-moderate Risk of opioid abuse or dependence: 0.7-3.0% with doses ? 36 MME/day and 6.1-26% with doses ? 120 MME/day. Opioid Risk Tool (ORT) Score: Total Score: 3 Low Risk for SUD (Score <3) Depression Scale Score: PHQ-2: PHQ-2 Total Score: 0 No depression (0) PHQ-9: PHQ-9 Total Score: 0 No depression (0-4)  Pharmacologic Plan: No change in therapy, at this time  Laboratory Chemistry  Inflammation Markers Lab Results  Component Value Date   ESRSEDRATE 16 09/18/2010   CRP 0.2 09/18/2010    Renal Function Lab Results  Component Value Date   BUN 25* 09/10/2015   CREATININE 1.00 09/10/2015   GFRAA >60 09/10/2015   GFRNONAA >60 09/10/2015    Hepatic Function Lab Results  Component Value Date   AST 19 07/28/2015   ALT 25 07/28/2015   ALBUMIN 4.5 07/28/2015    Electrolytes Lab Results  Component Value Date   NA 140 09/10/2015   K 4.6 09/10/2015   CL 102  09/10/2015   CALCIUM 9.1 09/10/2015   MG 2.1 06/30/2013    Pain Modulating Vitamins Lab Results  Component Value Date   VITAMINB12 319 10/04/2013    Coagulation Parameters Lab Results  Component Value Date   INR 1.12 09/11/2015   LABPROT 14.6 09/11/2015   APTT 31 09/11/2015   PLT 206 09/10/2015    Note: Labs Reviewed.  Recent Diagnostic Imaging  Ct Angio Head W/cm &/or Wo Cm  09/12/2015  CLINICAL DATA:  Severe headaches since Saturday. EXAM: CT ANGIOGRAPHY HEAD AND NECK TECHNIQUE: Multidetector CT imaging of the head and neck was performed using the standard protocol during bolus administration of intravenous contrast. Multiplanar  CT image reconstructions and MIPs were obtained to evaluate the vascular anatomy. Carotid stenosis measurements (when applicable) are obtained utilizing NASCET criteria, using the distal internal carotid diameter as the denominator. CONTRAST:  51mL OMNIPAQUE IOHEXOL 350 MG/ML SOLN COMPARISON:  None. CT head without contrast 09/10/2014. FINDINGS: CT HEAD Brain: Noncontrast imaging demonstrates no significant interval change. No acute infarct, hemorrhage, or mass lesion is present. No significant extraaxial fluid collection is present. The ventricles are of normal size. Calvarium and skull base: Negative Paranasal sinuses: A polyp or mucous retention cyst is again noted within the left maxillary sinus. Orbits: The globes and orbits are intact. CTA NECK Aortic arch: Atherosclerotic calcifications are present at the origins the great vessels without significant stenosis. A 3 vessel arch configuration is present. Right carotid system: The right common carotid artery is within normal limits. Atherosclerotic calcifications are present at the right carotid bifurcation. There is no significant stenosis of greater than 50% relative to the more distal vessels Left carotid system: The left common carotid artery is within normal limits. Atherosclerotic irregularity and calcifications are present at the left carotid bifurcation. There is no significant stenosis relative to the more distal vessel. Vertebral arteries:The vertebral arteries both originate from the subclavian arteries. There are subclavian calcifications just proximal to both vertebral artery origins. There is mild narrowing on the left. The left is the dominant vessel. No tandem stenoses are present within either vertebral artery. The right PICA origin is below the dural margin. Skeleton: Solid anterior fusion is present at C3-4, C4-5, and C5-6. No focal lytic or blastic lesions are present. Alignment is normal in the cervical and upper thoracic spinal cord. Other  neck: No focal mucosal or submucosal lesion is present. Salivary glands are normal. No significant adenopathy is present. The thyroid is unremarkable. Mild dependent atelectasis is present in both lungs. CTA HEAD Anterior circulation: The cavernous internal carotid arteries are within normal limits bilaterally. The ICA termini are unremarkable. The A1 and M1 segments are within normal limits. The anterior communicating artery is patent. The MCA bifurcations are intact. There is mild irregularity of MCA and distal ACA branch vessels bilaterally this is more pronounced in the right MCA branch vessels than the left. Posterior circulation: Left vertebral artery is the dominant vessel. The left PICA origin is visualized and normal. The basilar artery is normal. Both posterior cerebral arteries originate from basilar tip. A right posterior communicating artery contributes. The proximal posterior cerebral arteries are within normal limits. There is some attenuation of distal vessels bilaterally. Venous sinuses: The dural sinuses are patent. The right transverse sinus is dominant. The straight sinus deep cerebral veins are intact. Cortical veins are unremarkable. Anatomic variants: With none Delayed phase: The postcontrast images demonstrate no pathologic enhancement. IMPRESSION: 1. Atherosclerotic changes at the carotid bifurcations bilaterally without significant stenosis on  either side. 2. Mild diffuse small vessel disease, most prominent in the right MCA territory. 3. No significant proximal stenosis, aneurysm, or branch vessel occlusion within the circle of Willis. 4. Solid anterior cervical fusion at C3-4, C4-5, and C5-6. Electronically Signed   By: San Morelle M.D.   On: 09/12/2015 13:36   Ct Angio Neck W/cm &/or Wo/cm  09/12/2015  CLINICAL DATA:  Severe headaches since Saturday. EXAM: CT ANGIOGRAPHY HEAD AND NECK TECHNIQUE: Multidetector CT imaging of the head and neck was performed using the standard  protocol during bolus administration of intravenous contrast. Multiplanar CT image reconstructions and MIPs were obtained to evaluate the vascular anatomy. Carotid stenosis measurements (when applicable) are obtained utilizing NASCET criteria, using the distal internal carotid diameter as the denominator. CONTRAST:  54m OMNIPAQUE IOHEXOL 350 MG/ML SOLN COMPARISON:  None. CT head without contrast 09/10/2014. FINDINGS: CT HEAD Brain: Noncontrast imaging demonstrates no significant interval change. No acute infarct, hemorrhage, or mass lesion is present. No significant extraaxial fluid collection is present. The ventricles are of normal size. Calvarium and skull base: Negative Paranasal sinuses: A polyp or mucous retention cyst is again noted within the left maxillary sinus. Orbits: The globes and orbits are intact. CTA NECK Aortic arch: Atherosclerotic calcifications are present at the origins the great vessels without significant stenosis. A 3 vessel arch configuration is present. Right carotid system: The right common carotid artery is within normal limits. Atherosclerotic calcifications are present at the right carotid bifurcation. There is no significant stenosis of greater than 50% relative to the more distal vessels Left carotid system: The left common carotid artery is within normal limits. Atherosclerotic irregularity and calcifications are present at the left carotid bifurcation. There is no significant stenosis relative to the more distal vessel. Vertebral arteries:The vertebral arteries both originate from the subclavian arteries. There are subclavian calcifications just proximal to both vertebral artery origins. There is mild narrowing on the left. The left is the dominant vessel. No tandem stenoses are present within either vertebral artery. The right PICA origin is below the dural margin. Skeleton: Solid anterior fusion is present at C3-4, C4-5, and C5-6. No focal lytic or blastic lesions are present.  Alignment is normal in the cervical and upper thoracic spinal cord. Other neck: No focal mucosal or submucosal lesion is present. Salivary glands are normal. No significant adenopathy is present. The thyroid is unremarkable. Mild dependent atelectasis is present in both lungs. CTA HEAD Anterior circulation: The cavernous internal carotid arteries are within normal limits bilaterally. The ICA termini are unremarkable. The A1 and M1 segments are within normal limits. The anterior communicating artery is patent. The MCA bifurcations are intact. There is mild irregularity of MCA and distal ACA branch vessels bilaterally this is more pronounced in the right MCA branch vessels than the left. Posterior circulation: Left vertebral artery is the dominant vessel. The left PICA origin is visualized and normal. The basilar artery is normal. Both posterior cerebral arteries originate from basilar tip. A right posterior communicating artery contributes. The proximal posterior cerebral arteries are within normal limits. There is some attenuation of distal vessels bilaterally. Venous sinuses: The dural sinuses are patent. The right transverse sinus is dominant. The straight sinus deep cerebral veins are intact. Cortical veins are unremarkable. Anatomic variants: With none Delayed phase: The postcontrast images demonstrate no pathologic enhancement. IMPRESSION: 1. Atherosclerotic changes at the carotid bifurcations bilaterally without significant stenosis on either side. 2. Mild diffuse small vessel disease, most prominent in the right MCA territory.  3. No significant proximal stenosis, aneurysm, or branch vessel occlusion within the circle of Willis. 4. Solid anterior cervical fusion at C3-4, C4-5, and C5-6. Electronically Signed   By: San Morelle M.D.   On: 09/12/2015 13:36    Meds  The patient has a current medication list which includes the following prescription(s): accu-chek aviva plus, accu-chek softclix lancets,  AMBULATORY NON FORMULARY MEDICATION, aspirin, accu-chek aviva, accu-chek aviva plus, bupropion, ibuprofen, isosorbide dinitrate, nitroglycerin, oxycodone, oxycodone, oxycodone, PAIN MANAGEMENT IT PUMP REFILL, paroxetine, polyethylene glycol, rosuvastatin, tizanidine, and pregabalin.  Current Outpatient Prescriptions on File Prior to Visit  Medication Sig  . ACCU-CHEK AVIVA PLUS test strip TEST 2 TIMES DAY AS NEEDED **E11.9**  . ACCU-CHEK SOFTCLIX LANCETS lancets 1 each by Other route 2 (two) times daily. Use as instructed  . AMBULATORY NON FORMULARY MEDICATION Medication Name: CPAP MASK OF CHOICE FOR HOME DEVICE  . aspirin 81 MG tablet Take 81 mg by mouth daily.  . Blood Glucose Calibration (ACCU-CHEK AVIVA) SOLN 1 each by In Vitro route as needed. (Patient taking differently: 1 each by Other route See admin instructions. Check blood sugar once daily.)  . Blood Glucose Monitoring Suppl (ACCU-CHEK AVIVA PLUS) W/DEVICE KIT 1 Device by Does not apply route once. (Patient taking differently: 1 each by Other route See admin instructions. Check blood sugar once daily.)  . buPROPion (WELLBUTRIN XL) 150 MG 24 hr tablet TAKE 1 TABLET BY MOUTH DAILY  . ibuprofen (ADVIL,MOTRIN) 200 MG tablet Take 400 mg by mouth every 6 (six) hours as needed.  . isosorbide dinitrate (ISORDIL) 30 MG tablet TAKE 1 TABLET BY MOUTH DAILY  . nitroGLYCERIN (NITROSTAT) 0.4 MG SL tablet Place 1 tablet (0.4 mg total) under the tongue every 5 (five) minutes as needed for chest pain.  Marland Kitchen PAIN MANAGEMENT IT PUMP REFILL 1 each by Intrathecal route once. Medication: PF Fentanyl 500.0 mcg/ml PF Baclofen 300.66mg/ml PF Bupivicaine 20.0 mg/ml Total Volume: 40 ml Needed by 01-19-16 @ 0800  . PARoxetine (PAXIL) 20 MG tablet TAKE ONE-HALF (1/2) OF A TABLET BY MOUTHTWICE DAILY  . polyethylene glycol (MIRALAX / GLYCOLAX) packet Take 17 g by mouth daily.  . rosuvastatin (CRESTOR) 20 MG tablet Take 1 tablet (20 mg total) by mouth daily.   No  current facility-administered medications on file prior to visit.    ROS  Constitutional: Denies any fever or chills Gastrointestinal: No reported hemesis, hematochezia, vomiting, or acute GI distress Musculoskeletal: Denies any acute onset joint swelling, redness, loss of ROM, or weakness Neurological: No reported episodes of acute onset apraxia, aphasia, dysarthria, agnosia, amnesia, paralysis, loss of coordination, or loss of consciousness  Allergies  Ms. CTunghas No Known Allergies.  [place] [facility]:  Ms. CHogue has a past medical history of Degenerative disc disease; Spinal stenosis; Facet joint disease; Bulging disc; MRSA (methicillin resistant staph aureus) culture positive (2011); Fibromyalgia; Peripheral neuropathy ([place]; MCL deficiency, knee; Coronary arteritis; CAD (coronary artery disease) (2009); Hyperlipidemia; Heart disease; Cardiac arrhythmia due to congenital heart disease; Chicken pox; Anemia; Sleep apnea; Neuropathy ([place] (04/18/2010); and Diabetes mellitus without complication ([place]. Family: family history includes Cancer in her mother and paternal grandfather; Diabetes in her father and paternal grandmother; Heart disease in her maternal grandfather. There is no history of Stroke. Surgical:  has past surgical history that includes Foot surgery (Right); Hand surgery (Right); Gallbladder surgery; Ablation; Ablation; HEART STENT (2009); HAND SURGEY (Left); Foot surgery (Bilateral); Foot surgery (Right); Infusion pump implantation; Eye surgery (Bilateral); Back surgery; Fracture surgery (  Right, 2012); Cholecystectomy (2003); and Anterior cervical decomp/discectomy fusion (N/A, 07/28/2014). Tobacco:  reports that she quit smoking about 8 years ago. Her smoking use included Cigarettes. She has a 48 pack-year smoking history. She has never used smokeless tobacco. Alcohol:  reports that she drinks alcohol. Drug:  reports that she does not use illicit drugs.  Constitutional Exam   Vitals: Blood pressure 123/68, pulse 77, temperature 98.7 F (37.1 C), resp. rate 18, height '5\' 10"'$  (1.778 m), weight 217 lb (98.431 kg), SpO2 96 %. General appearance: Well nourished, well developed, and well hydrated. In no acute distress Calculated BMI/Body habitus: Body mass index is 31.14 kg/(m^2). (30-34.9 kg/m2) Obese (Class I) - 68% higher incidence of chronic pain Psych/Mental status: Alert and oriented x 3 (person, place, & time) Eyes: PERLA Respiratory: No evidence of acute respiratory distress  Cervical Spine Exam  Inspection: No masses, redness, or swelling Alignment: Symmetrical ROM: Functional: ROM is within functional limits [facility]) Stability: No instability detected Muscle strength & Tone: Functionally intact Sensory: Unimpaired Palpation: No complaints of tenderness  Upper Extremity (UE) Exam    Side: Right upper extremity  Side: Left upper extremity  Inspection: No masses, redness, swelling, or asymmetry  Inspection: No masses, redness, swelling, or asymmetry  ROM:  ROM:  Functional: ROM is within functional limits [facility])  Functional: ROM is within functional limits [facility])  Muscle strength & Tone: Functionally intact  Muscle strength & Tone: Functionally intact  Sensory: Unimpaired  Sensory: Unimpaired  Palpation: Non-contributory  Palpation: Non-contributory   Thoracic Spine Exam  Inspection: No masses, redness, or swelling Alignment: Symmetrical ROM: Functional: ROM is within functional limits [facility]) Stability: No instability detected Sensory: Unimpaired Muscle strength & Tone: Functionally intact Palpation: No complaints of tenderness  Lumbar Spine Exam  Inspection: Well healed scar from previous spine surgery detected Alignment: Symmetrical ROM: Functional: ROM is within functional limits [facility]) Stability: No instability detected Muscle strength & Tone: Functionally intact Sensory: Unimpaired Palpation: No complaints of tenderness Provocative  Tests: Lumbar Hyperextension and rotation test: evaluation deferred today       Patrick's Maneuver: evaluation deferred today              Gait & Posture Assessment  Ambulation: Unassisted Gait: Unaffected Posture: WNL  Lower Extremity Exam    Side: Right lower extremity  Side: Left lower extremity  Inspection: No masses, redness, swelling, or asymmetry ROM:  Inspection: No masses, redness, swelling, or asymmetry ROM:  Functional: ROM is within functional limits [facility])  Functional: ROM is within functional limits [facility])  Muscle strength & Tone: Functionally intact  Muscle strength & Tone: Functionally intact  Sensory: Unimpaired  Sensory: Unimpaired  Palpation: Non-contributory  Palpation: Non-contributory   Assessment & Plan  Primary Diagnosis & Pertinent Problem List: The primary encounter diagnosis was Chronic neck pain. Diagnoses of Chronic pain and Fibromyalgia were also pertinent to this visit.  Visit Diagnosis: 1. Chronic neck pain   2. Chronic pain   3. Fibromyalgia     Problems updated and reviewed during this visit: No problems updated.  Problem-specific Plan(s): No problem-specific assessment & plan notes found for this encounter.  No new assessment & plan notes have been filed under this hospital service since the last note was generated. Service: Pain Management   Plan of Care   Problem List Items Addressed This Visit      High   Chronic neck pain - Primary (Chronic)   Relevant Medications   tiZANidine (ZANAFLEX) 4 MG tablet  oxyCODONE (OXY IR/ROXICODONE) 5 MG immediate release tablet   oxyCODONE (OXY IR/ROXICODONE) 5 MG immediate release tablet   oxyCODONE (OXY IR/ROXICODONE) 5 MG immediate release tablet   pregabalin (LYRICA) 100 MG capsule   Chronic pain (Chronic)   Relevant Medications   tiZANidine (ZANAFLEX) 4 MG tablet   oxyCODONE (OXY IR/ROXICODONE) 5 MG immediate release tablet   oxyCODONE (OXY IR/ROXICODONE) 5 MG immediate release tablet    oxyCODONE (OXY IR/ROXICODONE) 5 MG immediate release tablet   pregabalin (LYRICA) 100 MG capsule   Fibromyalgia (Chronic)   Relevant Medications   pregabalin (LYRICA) 100 MG capsule       Pharmacotherapy (Medications Ordered): Meds ordered this encounter  Medications  . tiZANidine (ZANAFLEX) 4 MG tablet    Sig: Take 1 tablet (4 mg total) by mouth every 6 (six) hours as needed.    Dispense:  120 tablet    Refill:  2    Do not place this medication, or any other prescription from our practice, on "Automatic Refill". Patient may have prescription filled one day early if pharmacy is closed on scheduled refill date.  Marland Kitchen oxyCODONE (OXY IR/ROXICODONE) 5 MG immediate release tablet    Sig: Take 1 tablet (5 mg total) by mouth every 8 (eight) hours as needed for severe pain.    Dispense:  90 tablet    Refill:  0    Do not place this medication, or any other prescription from our practice, on "Automatic Refill". Patient may have prescription filled one day early if pharmacy is closed on scheduled refill date. Do not fill until: 01/21/16 To last until: 02/20/16  . oxyCODONE (OXY IR/ROXICODONE) 5 MG immediate release tablet    Sig: Take 1 tablet (5 mg total) by mouth every 8 (eight) hours as needed for severe pain.    Dispense:  90 tablet    Refill:  0    Do not place this medication, or any other prescription from our practice, on "Automatic Refill". Patient may have prescription filled one day early if pharmacy is closed on scheduled refill date. Do not fill until: 02/20/16 To last until: 03/21/16  . oxyCODONE (OXY IR/ROXICODONE) 5 MG immediate release tablet    Sig: Take 1 tablet (5 mg total) by mouth every 8 (eight) hours as needed for severe pain.    Dispense:  90 tablet    Refill:  0    Do not place this medication, or any other prescription from our practice, on "Automatic Refill". Patient may have prescription filled one day early if pharmacy is closed on scheduled refill date. Do not  fill until: 03/21/16 To last until: 04/20/16  . pregabalin (LYRICA) 100 MG capsule    Sig: Take 1 capsule (100 mg total) by mouth 3 (three) times daily.    Dispense:  90 capsule    Refill:  2    Do not place this medication, or any other prescription from our practice, on "Automatic Refill". Patient may have prescription filled one day early if pharmacy is closed on scheduled refill date.    Lab-work & Procedure Ordered: No orders of the defined types were placed in this encounter.    Imaging Ordered: None  Interventional Therapies: Scheduled: Intrathecal pump refill.    Considering: Right knee genicular nerve blocks under fluoroscopic guidance, with or without sedation the pending of her choice.    PRN Procedures: None at this point.        Referral(s) or Consult(s): None at this time.  New  Prescriptions   No medications on file    Medications administered during this visit: Ms. Mallet had no medications administered during this visit.  Requested PM Follow-up: Return in about 3 months (around 04/20/2016) for Med-Mgmt, (3-Mo), Pump Refill (as per program).  Future Appointments Date Time Provider [place]  04/19/2016 11:00 AM [name], MD [facility] None    Primary Care Physician: [name], NP Location: [facility] Outpatient Pain Management Facility Note by: Kathlen Brunswick. Dossie Arbour, M.D, DABA, DABAPM, DABPM, DABIPP, FIPP  Pain Score Disclaimer: We use the NRS-11 scale. This is a self-reported, subjective measurement of pain severity with only modest accuracy. It is used primarily to identify changes within a particular patient. It must be understood that outpatient pain scales are significantly less accurate that those used for research, where they can be applied under ideal controlled circumstances with minimal exposure to variables. In reality, the score is likely to be a combination of pain intensity and pain affect, where pain affect describes  the degree of emotional arousal or changes in action readiness caused by the sensory experience of pain. Factors such as social and work situation, setting, emotional state, anxiety levels, expectation, and prior pain experience may influence pain perception and show large inter-individual differences that may also be affected by time variables.  Patient instructions provided during this appointment: Patient Instructions  You were given 3 prescriptions for Oxycodone. A script for Tizanidine was sent to your pharmacy.

## 2016-01-19 NOTE — Telephone Encounter (Signed)
Lyrica was not prescribed today. Patient informed we will e-scribe it to her pharmacy.

## 2016-01-19 NOTE — Telephone Encounter (Signed)
Pt called in about her med she would love for someone to return her call...thanks

## 2016-01-26 MED FILL — Medication: INTRATHECAL | Qty: 1 | Status: AC

## 2016-02-20 ENCOUNTER — Telehealth: Payer: Self-pay | Admitting: Internal Medicine

## 2016-02-20 NOTE — Telephone Encounter (Signed)
Patient Name: Melinda [name]  DOB: April 28, 1956    Initial Comment caller states she has dizziness   Nurse Assessment  Nurse: Raphael Gibney, RN, Vanita Ingles Date/Time [name] Time): 02/20/2016 10:25:02 AM  Confirm and document reason for call. If symptomatic, describe symptoms. You must click the next button to save text entered. ---Caller states she has intermittent dizziness. She is diabetic. Her blood sugar is not low when she is dizzy. BP 110/50. Dizziness can happen at any time. Has had dizziness for 2 months. She gets lightheaded, she sees stars.  Has the patient traveled out of the country within the last 30 days? ---Not Applicable  Does the patient have any new or worsening symptoms? ---Yes  Will a triage be completed? ---Yes  Related visit to physician within the last 2 weeks? ---No  Does the PT have any chronic conditions? (i.e. diabetes, asthma, etc.) ---Yes  List chronic conditions. ---diabetes, cardiac stent, history of arrhythmia  Is this a behavioral health or substance abuse call? ---No     Guidelines    Guideline Title Affirmed Question Affirmed Notes  Dizziness - Lightheadedness [1] MODERATE dizziness (e.g., interferes with normal activities) AND [2] has NOT been evaluated by physician for this (Exception: dizziness caused by heat exposure, sudden standing, or poor fluid intake)    Final Disposition User   See Physician within 24 Hours [place], RN, Vera    Comments  appt scheduled for 02/21/16 at 10 am with [facility]   Referrals  REFERRED TO PCP OFFICE   Disagree/Comply: Leta Baptist

## 2016-02-20 NOTE — Telephone Encounter (Signed)
Pt has appt on 02/21/16 at 10 Am with Avie Echevaria NP.

## 2016-02-21 ENCOUNTER — Encounter: Payer: Self-pay | Admitting: Internal Medicine

## 2016-02-21 ENCOUNTER — Ambulatory Visit (INDEPENDENT_AMBULATORY_CARE_PROVIDER_SITE_OTHER): Payer: Medicare Other | Admitting: Internal Medicine

## 2016-02-21 VITALS — BP 110/64 | HR 79 | Temp 98.9°F | Wt 224.0 lb

## 2016-02-21 DIAGNOSIS — E1142 Type 2 diabetes mellitus with diabetic polyneuropathy: Secondary | ICD-10-CM | POA: Diagnosis not present

## 2016-02-21 DIAGNOSIS — G894 Chronic pain syndrome: Secondary | ICD-10-CM | POA: Diagnosis not present

## 2016-02-21 DIAGNOSIS — I951 Orthostatic hypotension: Secondary | ICD-10-CM

## 2016-02-21 DIAGNOSIS — I2510 Atherosclerotic heart disease of native coronary artery without angina pectoris: Secondary | ICD-10-CM

## 2016-02-21 LAB — COMPREHENSIVE METABOLIC PANEL
ALT: 22 U/L (ref 0–35)
AST: 20 U/L (ref 0–37)
Albumin: 4.3 g/dL (ref 3.5–5.2)
Alkaline Phosphatase: 93 U/L (ref 39–117)
BUN: 27 mg/dL — ABNORMAL HIGH (ref 6–23)
CO2: 30 mEq/L (ref 19–32)
Calcium: 8.8 mg/dL (ref 8.4–10.5)
Chloride: 106 mEq/L (ref 96–112)
Creatinine, Ser: 0.94 mg/dL (ref 0.40–1.20)
GFR: 64.91 mL/min (ref >60.00)
Glucose, Bld: 124 mg/dL — ABNORMAL HIGH (ref 70–99)
Potassium: 4.6 mEq/L (ref 3.5–5.1)
Sodium: 142 mEq/L (ref 135–145)
Total Bilirubin: 0.4 mg/dL (ref 0.2–1.2)
Total Protein: 7.1 g/dL (ref 6.0–8.3)

## 2016-02-21 LAB — CBC
HCT: 37.5 % (ref 36.0–46.0)
Hemoglobin: 12.5 g/dL (ref 12.0–15.0)
MCHC: 33.3 g/dL (ref 30.0–36.0)
MCV: 85.6 fl (ref 78.0–100.0)
Platelets: 253 10*3/uL (ref 150.0–400.0)
RBC: 4.38 Mil/uL (ref 3.87–5.11)
RDW: 13.6 % (ref 11.5–15.5)
WBC: 5.4 10*3/uL (ref 4.0–10.5)

## 2016-02-21 LAB — TSH: TSH: 2.17 u[IU]/mL (ref 0.35–4.50)

## 2016-02-21 NOTE — Patient Instructions (Signed)

## 2016-02-21 NOTE — Progress Notes (Signed)
Subjective:    Patient ID: Melinda [name], female    DOB: 12/07/1955, 60 y.o.   MRN: [id number]  HPI  Pt presents to the clinic today with c/o dizziness. She reports this started about 9 months ago. It is intermittent. It can occur at any time and as often as 4 x week, or she can go a few weeks without symptoms. She reports it mostly occurs when getting out of the care or walking around outside in the heat. She will all of a sudden feel a tingling sensation throughout her body, ringing in her ears, and feel as if she may pass out (but she has not). She denies associated chest pain, shortness of breath. She has taken her blood glucose during these times and reports it is always normal. She denies any recent changes in medication, diet or activity level. She has not tried anything OTC for this.   Review of Systems  Past Medical History:  Diagnosis Date  . Anemia    years ago  . Bulging disc   . CAD (coronary artery disease) 2009   s/p stent to LAD  . 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 Chicken pox   . Coronary arteritis   . Degenerative disc disease   . Diabetes mellitus without complication ([place])   . Facet joint disease   . Fibromyalgia   . Heart disease   . Hyperlipidemia   . MCL deficiency, knee   . MRSA (methicillin resistant staph aureus) culture positive 2011   GREAT TOE RIGHT FOOT  . Neuropathy ([place]) 04/18/2010  . Peripheral neuropathy ([place])   . Sleep apnea    uses CPAP, sleep study at [facility]  . Spinal stenosis     Current Outpatient Prescriptions  Medication Sig Dispense Refill  . ACCU-CHEK AVIVA PLUS test strip TEST 2 TIMES DAY AS NEEDED **E11.9** 100 each 6  . ACCU-CHEK SOFTCLIX LANCETS lancets 1 each by Other route 2 (two) times daily. Use as instructed 100 each 2  . AMBULATORY NON FORMULARY MEDICATION Medication Name: CPAP MASK OF CHOICE FOR HOME DEVICE 1 each 0  . aspirin 81 MG tablet Take 81 mg by mouth daily.    . Blood Glucose  Calibration (ACCU-CHEK AVIVA) SOLN 1 each by In Vitro route as needed. (Patient taking differently: 1 each by Other route See admin instructions. Check blood sugar once daily.) 1 each 1  . Blood Glucose Monitoring Suppl (ACCU-CHEK AVIVA PLUS) W/DEVICE KIT 1 Device by Does not apply route once. (Patient taking differently: 1 each by Other route See admin instructions. Check blood sugar once daily.) 1 kit 0  . buPROPion (WELLBUTRIN XL) 150 MG 24 hr tablet TAKE 1 TABLET BY MOUTH DAILY 90 tablet 2  . ibuprofen (ADVIL,MOTRIN) 200 MG tablet Take 400 mg by mouth every 6 (six) hours as needed.    . isosorbide dinitrate (ISORDIL) 30 MG tablet TAKE 1 TABLET BY MOUTH DAILY 30 tablet 8  . nitroGLYCERIN (NITROSTAT) 0.4 MG SL tablet Place 1 tablet (0.4 mg total) under the tongue every 5 (five) minutes as needed for chest pain. 25 tablet 2  . oxyCODONE (OXY IR/ROXICODONE) 5 MG immediate release tablet Take 1 tablet (5 mg total) by mouth every 8 (eight) hours as needed for severe pain. 90 tablet 0  . PAIN MANAGEMENT IT PUMP REFILL 1 each by Intrathecal route once. Medication: PF Fentanyl 500.0 mcg/ml PF Baclofen 300.17mg/ml PF Bupivicaine 20.0 mg/ml Total Volume: 40 ml Needed by  01-19-16 @ 0800 1 each 0  . PARoxetine (PAXIL) 20 MG tablet TAKE ONE-HALF (1/2) OF A TABLET BY MOUTHTWICE DAILY 90 tablet 0  . polyethylene glycol (MIRALAX / GLYCOLAX) packet Take 17 g by mouth daily.    . pregabalin (LYRICA) 100 MG capsule Take 1 capsule (100 mg total) by mouth 3 (three) times daily. 90 capsule 2  . rosuvastatin (CRESTOR) 20 MG tablet Take 1 tablet (20 mg total) by mouth daily. 30 tablet 7  . tiZANidine (ZANAFLEX) 4 MG tablet Take 1 tablet (4 mg total) by mouth every 6 (six) hours as needed. 120 tablet 2   No current facility-administered medications for this visit.     No Known Allergies  Family History  Problem Relation Age of Onset  . Cancer Mother     lung  . Diabetes Father   . Heart disease Maternal  Grandfather   . Diabetes Paternal Grandmother   . Cancer Paternal Grandfather     colon  . Stroke Neg Hx     Social History   Social History  . Marital status: Significant Other    Spouse name: N/A  . Number of children: N/A  . Years of education: N/A   Occupational History  . Not on file.   Social History Main Topics  . Smoking status: Former Smoker    Packs/day: 1.50    Years: 32.00    Types: Cigarettes    Quit date: 07/22/2007  . Smokeless tobacco: Never Used  . Alcohol use 0.0 oz/week     Comment: occ  . Drug use: No  . Sexual activity: Yes   Other Topics Concern  . Not on file   Social History Narrative  . No narrative on file     Constitutional: Denies fever, malaise, fatigue, headache or abrupt weight changes.  HEENT: Denies eye pain, eye redness, ear pain, ringing in the ears, wax buildup, runny nose, nasal congestion, bloody nose, or sore throat. Respiratory: Denies difficulty breathing, shortness of breath, cough or sputum production.   Cardiovascular: Denies chest pain, chest tightness, palpitations or swelling in the hands or feet.  Neurological: Pt reports dizziness. Denies difficulty with memory, difficulty with speech or problems with balance and coordination.  Psych: Pt has a history of depression. Denies anxiety, SI/HI.  No other specific complaints in a complete review of systems (except as listed in HPI above).     Objective:   Physical Exam   BP 110/64   Pulse 79   Temp 98.9 F (37.2 C) (Oral)   Wt 224 lb (101.6 kg)   SpO2 95%   BMI 32.14 kg/m  Wt Readings from Last 3 Encounters:  02/21/16 224 lb (101.6 kg)  01/19/16 217 lb (98.4 kg)  01/06/16 222 lb (100.7 kg)    General: Appears her stated age, obese in NAD. Cardiovascular: Normal rate and rhythm. S1,S2 noted.  No murmur, rubs or gallops noted. No carotid bruits noted. Pulmonary/Chest: Normal effort and positive vesicular breath sounds. No respiratory distress. No wheezes, rales  or ronchi noted.  Neurological: Alert and oriented. Coordination normal.  Psychiatric: Mood and affect flat. Behavior is normal. Judgment and thought content normal.    BMET    Component Value Date/Time   NA 140 09/10/2015 1751   NA 135 (L) 06/30/2013 1203   K 4.6 09/10/2015 1751   K 4.1 06/30/2013 1203   CL 102 09/10/2015 1751   CL 102 06/30/2013 1203   CO2 27 09/10/2015 1751   CO2  32 06/30/2013 1203   GLUCOSE 143 (H) 09/10/2015 1751   GLUCOSE 143 (H) 06/30/2013 1203   BUN 25 (H) 09/10/2015 1751   BUN 19 (H) 06/30/2013 1203   CREATININE 1.00 09/10/2015 1751   CREATININE 0.89 06/30/2013 1203   CALCIUM 9.1 09/10/2015 1751   CALCIUM 9.7 06/30/2013 1203   GFRNONAA >60 09/10/2015 1751   GFRNONAA >60 06/30/2013 1203   GFRAA >60 09/10/2015 1751   GFRAA >60 06/30/2013 1203    Lipid Panel     Component Value Date/Time   CHOL 120 09/11/2015 0421   TRIG 86 09/11/2015 0421   HDL 41 09/11/2015 0421   CHOLHDL 2.9 09/11/2015 0421   VLDL 17 09/11/2015 0421   LDLCALC 62 09/11/2015 0421    CBC    Component Value Date/Time   WBC 9.6 09/10/2015 1751   RBC 4.49 09/10/2015 1751   HGB 12.7 09/10/2015 1751   HGB 14.0 11/12/2011 1136   HCT 38.9 09/10/2015 1751   HCT 40.7 11/12/2011 1136   PLT 206 09/10/2015 1751   PLT 274 11/12/2011 1136   MCV 86.6 09/10/2015 1751   MCV 86 11/12/2011 1136   MCH 28.3 09/10/2015 1751   MCHC 32.6 09/10/2015 1751   RDW 12.8 09/10/2015 1751   RDW 12.8 11/12/2011 1136   LYMPHSABS 1.7 07/21/2014 1340   LYMPHSABS 2.5 11/12/2011 1136   MONOABS 0.7 07/21/2014 1340   MONOABS 0.8 11/12/2011 1136   EOSABS 0.3 07/21/2014 1340   EOSABS 0.6 11/12/2011 1136   BASOSABS 0.0 07/21/2014 1340   BASOSABS 0.1 11/12/2011 1136    Hgb A1C Lab Results  Component Value Date   HGBA1C 6.3 (H) 09/11/2015        Assessment & Plan:  Dizziness secondary to orthostatic hypotension:  Orthostatics, she did have a drop I do not want to put her on Fludrocortisone due  to history of DM 2 ? If this is coming from her multiple pain medications  Will check CBC, CMET and TSH today Encouraged her to push fluids, make position changes slowly  Will follow up after labs, RTC in 5 months for Medicare Wellness/Follow up [name], NP

## 2016-02-28 ENCOUNTER — Encounter: Payer: Self-pay | Admitting: Cardiology

## 2016-03-06 ENCOUNTER — Other Ambulatory Visit: Payer: Self-pay | Admitting: Cardiology

## 2016-03-13 ENCOUNTER — Ambulatory Visit: Payer: Medicare Other | Admitting: Cardiology

## 2016-03-14 DIAGNOSIS — E119 Type 2 diabetes mellitus without complications: Secondary | ICD-10-CM | POA: Diagnosis not present

## 2016-03-14 DIAGNOSIS — H43813 Vitreous degeneration, bilateral: Secondary | ICD-10-CM | POA: Diagnosis not present

## 2016-03-14 DIAGNOSIS — H52223 Regular astigmatism, bilateral: Secondary | ICD-10-CM | POA: Diagnosis not present

## 2016-03-14 DIAGNOSIS — H2513 Age-related nuclear cataract, bilateral: Secondary | ICD-10-CM | POA: Diagnosis not present

## 2016-03-14 DIAGNOSIS — H5213 Myopia, bilateral: Secondary | ICD-10-CM | POA: Diagnosis not present

## 2016-03-14 DIAGNOSIS — H524 Presbyopia: Secondary | ICD-10-CM | POA: Diagnosis not present

## 2016-03-14 DIAGNOSIS — Z7984 Long term (current) use of oral hypoglycemic drugs: Secondary | ICD-10-CM | POA: Diagnosis not present

## 2016-03-14 LAB — HM DIABETES EYE EXAM

## 2016-03-23 ENCOUNTER — Other Ambulatory Visit: Payer: Self-pay

## 2016-03-23 MED ORDER — PAIN MANAGEMENT IT PUMP REFILL: 1.0000 | Freq: Once | INTRATHECAL | 0 refills | Status: DC

## 2016-03-24 ENCOUNTER — Other Ambulatory Visit: Payer: Self-pay | Admitting: Cardiology

## 2016-03-29 ENCOUNTER — Encounter: Payer: Self-pay | Admitting: Internal Medicine

## 2016-04-02 ENCOUNTER — Other Ambulatory Visit: Payer: Self-pay | Admitting: Cardiology

## 2016-04-02 MED ORDER — ROSUVASTATIN CALCIUM 20 MG PO TABS: 20.0000 mg | ORAL_TABLET | Freq: Every day | ORAL | 1 refills | Status: DC

## 2016-04-02 MED ORDER — ISOSORBIDE DINITRATE 30 MG PO TABS: 30.0000 mg | ORAL_TABLET | Freq: Every day | ORAL | 1 refills | Status: DC

## 2016-04-11 DIAGNOSIS — M1711 Unilateral primary osteoarthritis, right knee: Secondary | ICD-10-CM | POA: Diagnosis not present

## 2016-04-16 DIAGNOSIS — M25661 Stiffness of right knee, not elsewhere classified: Secondary | ICD-10-CM | POA: Diagnosis not present

## 2016-04-16 DIAGNOSIS — M1711 Unilateral primary osteoarthritis, right knee: Secondary | ICD-10-CM | POA: Diagnosis not present

## 2016-04-16 DIAGNOSIS — M25561 Pain in right knee: Secondary | ICD-10-CM | POA: Diagnosis not present

## 2016-04-18 DIAGNOSIS — M25561 Pain in right knee: Secondary | ICD-10-CM | POA: Diagnosis not present

## 2016-04-18 DIAGNOSIS — M1711 Unilateral primary osteoarthritis, right knee: Secondary | ICD-10-CM | POA: Diagnosis not present

## 2016-04-18 DIAGNOSIS — M25661 Stiffness of right knee, not elsewhere classified: Secondary | ICD-10-CM | POA: Diagnosis not present

## 2016-04-19 ENCOUNTER — Ambulatory Visit: Payer: Medicare Other | Attending: Pain Medicine | Admitting: Pain Medicine

## 2016-04-19 ENCOUNTER — Encounter: Payer: Self-pay | Admitting: Pain Medicine

## 2016-04-19 VITALS — BP 96/53 | HR 82 | Temp 98.2°F | Resp 16 | Ht 69.0 in | Wt 220.0 lb

## 2016-04-19 DIAGNOSIS — M797 Fibromyalgia: Secondary | ICD-10-CM

## 2016-04-19 DIAGNOSIS — Z969 Presence of functional implant, unspecified: Secondary | ICD-10-CM | POA: Diagnosis not present

## 2016-04-19 DIAGNOSIS — M7918 Myalgia, other site: Secondary | ICD-10-CM

## 2016-04-19 DIAGNOSIS — Z955 Presence of coronary angioplasty implant and graft: Secondary | ICD-10-CM | POA: Insufficient documentation

## 2016-04-19 DIAGNOSIS — Z451 Encounter for adjustment and management of infusion pump: Secondary | ICD-10-CM | POA: Diagnosis not present

## 2016-04-19 DIAGNOSIS — Z9689 Presence of other specified functional implants: Secondary | ICD-10-CM | POA: Diagnosis not present

## 2016-04-19 DIAGNOSIS — M542 Cervicalgia: Secondary | ICD-10-CM | POA: Diagnosis not present

## 2016-04-19 DIAGNOSIS — Z79891 Long term (current) use of opiate analgesic: Secondary | ICD-10-CM

## 2016-04-19 DIAGNOSIS — M961 Postlaminectomy syndrome, not elsewhere classified: Secondary | ICD-10-CM | POA: Insufficient documentation

## 2016-04-19 DIAGNOSIS — M8949 Other hypertrophic osteoarthropathy, multiple sites: Secondary | ICD-10-CM

## 2016-04-19 DIAGNOSIS — M150 Primary generalized (osteo)arthritis: Secondary | ICD-10-CM

## 2016-04-19 DIAGNOSIS — M792 Neuralgia and neuritis, unspecified: Secondary | ICD-10-CM | POA: Diagnosis not present

## 2016-04-19 DIAGNOSIS — M791 Myalgia: Secondary | ICD-10-CM | POA: Diagnosis not present

## 2016-04-19 DIAGNOSIS — Z978 Presence of other specified devices: Secondary | ICD-10-CM

## 2016-04-19 DIAGNOSIS — G894 Chronic pain syndrome: Secondary | ICD-10-CM

## 2016-04-19 DIAGNOSIS — Z9049 Acquired absence of other specified parts of digestive tract: Secondary | ICD-10-CM | POA: Diagnosis not present

## 2016-04-19 DIAGNOSIS — M159 Polyosteoarthritis, unspecified: Secondary | ICD-10-CM

## 2016-04-19 DIAGNOSIS — G8929 Other chronic pain: Secondary | ICD-10-CM

## 2016-04-19 DIAGNOSIS — F1190 Opioid use, unspecified, uncomplicated: Secondary | ICD-10-CM

## 2016-04-19 MED ORDER — OXYCODONE HCL 5 MG PO TABS: 5.0000 mg | ORAL_TABLET | Freq: Three times a day (TID) | ORAL | 0 refills | Status: DC | PRN

## 2016-04-19 MED ORDER — TIZANIDINE HCL 4 MG PO TABS: 4.0000 mg | ORAL_TABLET | Freq: Four times a day (QID) | ORAL | 2 refills | Status: DC | PRN

## 2016-04-19 MED ORDER — PREGABALIN 100 MG PO CAPS: 100.0000 mg | ORAL_CAPSULE | Freq: Three times a day (TID) | ORAL | 0 refills | Status: DC

## 2016-04-19 NOTE — Progress Notes (Signed)
Nursing Pain Medication Assessment:  Safety precautions to be maintained throughout the outpatient stay will include: orient to surroundings, keep bed in low position, maintain call bell within reach at all times, provide assistance with transfer out of bed and ambulation.  Medication Inspection Compliance: Pill count conducted under aseptic conditions, in front of the patient. Neither the pills nor the bottle was removed from the patient's sight at any time. Once count was completed pills were immediately returned to the patient in their original bottle. Pill Count: 49 of 90 pills remain Bottle Appearance: Standard pharmacy container. Clearly labeled. Medication: Oxycodone IR Filled Date: 09 / 27 / 2017

## 2016-04-19 NOTE — Progress Notes (Signed)
Patient's Name: Melinda [name]  MRN: [id number]  Referring Provider: Jearld Fenton, NP  DOB: [birth year]  PCP: [name], NP  DOS: 04/19/2016  Note by: Kathlen Brunswick. Dossie Arbour, MD  Service setting: Ambulatory outpatient  Location: [facility] (AMB) Pain Management Facility  Visit type: Procedure  Specialty: Interventional Pain Management  Patient type: Established   Primary Reason for Visit: Interventional Pain Management Treatment. CC: Knee Pain (right) and Back Pain (lower)  Procedure:  Intrathecal Drug Delivery System (IDDS):  Type: Reservoir Refill [phone]) No rate change Region: Abdominal Laterality: Right  Type of Pump: Medtronic Synchromed II (MRI-compatible) Delivery Route: Intrathecal Type of Pain Treated: Neuropathic/Nociceptive Primary Medication Class: Opioid/opiate  Medication, Concentration, Infusion Program, & Delivery Rate: Please see scanned programming printout.  Indications: 1. Chronic pain syndrome   2. Chronic neck pain   3. Fibromyalgia   4. Long term current use of opiate analgesic   5. Long term prescription opiate use   6. Opiate use   7. Presence of functional implant (Medtronic programmable intrathecal pump) (Right abdominal area)   8. Encounter for adjustment or management of infusion pump   9. Presence of intrathecal pump (Medtronic intrathecal programmable pump) (40 mL pump)   10. Neuropathic pain   11. Primary osteoarthritis involving multiple joints   12. 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 (year old), female patient, seen today for interventional treatment. She  has a past surgical history that includes Foot surgery (Right); Hand surgery (Right); Gallbladder surgery; Ablation; Ablation; HEART STENT (2009); HAND SURGEY (Left); Foot surgery (Bilateral); Foot surgery (Right); Infusion pump implantation;  Eye surgery (Bilateral); Back surgery; Fracture surgery (Right, 2012); Cholecystectomy (2003); and Anterior cervical decomp/discectomy fusion (N/A, 07/28/2014).. Her primarily concern today is the Knee Pain (right) and Back Pain (lower) The primary encounter diagnosis was Chronic pain syndrome. Diagnoses of Chronic neck pain, Fibromyalgia, Long term current use of opiate analgesic, Long term prescription opiate use, Opiate use, Presence of functional implant (Medtronic programmable intrathecal pump) (Right abdominal area), Encounter for adjustment or management of infusion pump, Presence of intrathecal pump (Medtronic intrathecal programmable pump) (40 mL pump), Neuropathic pain, Primary osteoarthritis involving multiple joints, Musculoskeletal pain, and Failed back surgical syndrome were also pertinent to this visit.  Pain Type: Chronic pain Pain Location: Knee (patient also c/o stiffness in her neck.) Pain Orientation: Right Pain Descriptors / Indicators: Spasm, Constant, Aching, Sharp (sharp pain is in the medial aspect of the knee) Pain Frequency: Constant  Date of Last Visit: 01/19/16 Service Provided on Last Visit: Med Refill  Coagulation Parameters Lab Results  Component Value Date   INR 1.12 09/11/2015   LABPROT 14.6 09/11/2015   APTT 31 09/11/2015   PLT 253.0 02/21/2016   Verification of the correct person, correct site (including marking of site), and correct procedure were performed and confirmed by the patient.  Consent: Before the procedure and under the influence of no sedative(s), amnesic(s), or anxiolytics, the patient was informed of the treatment options, risks and possible complications. To fulfill our ethical and legal obligations, as recommended by the American Medical Association's Code of Ethics, I have informed the patient of my clinical impression; the nature and purpose of the treatment or procedure; the risks, benefits, and possible complications of the intervention;  the alternatives, including doing nothing; the risk(s) and benefit(s) of the alternative treatment(s) or  procedure(s); and the risk(s) and benefit(s) of doing nothing. The patient was provided information about the general risks and possible complications associated with the procedure. These may include, but are not limited to: failure to achieve desired goals, infection, bleeding, organ or nerve damage, allergic reactions, paralysis, and death. In addition, the patient was informed of those risks and complications associated to the procedure, such as failure to decrease pain; infection; bleeding; organ or nerve damage with subsequent damage to sensory, motor, and/or autonomic systems, resulting in permanent pain, numbness, and/or weakness of one or several areas of the body; allergic reactions; (i.e.: anaphylactic reaction); and/or death. Furthermore, the patient was informed of those risks and complications associated with the medications. These include, but are not limited to: allergic reactions (i.e.: anaphylactic or anaphylactoid reaction(s)); adrenal axis suppression; blood sugar elevation that in diabetics may result in ketoacidosis or comma; water retention that in patients with history of congestive heart failure may result in shortness of breath, pulmonary edema, and decompensation with resultant heart failure; weight gain; swelling or edema; medication-induced neural toxicity; particulate matter embolism and blood vessel occlusion with resultant organ, and/or nervous system infarction; and/or aseptic necrosis of one or more joints. Finally, the patient was informed that Medicine is not an exact science; therefore, there is also the possibility of unforeseen or unpredictable risks and/or possible complications that may result in a catastrophic outcome. The patient indicated having understood very clearly. We have given the patient no guarantees and we have made no promises. Enough time was given to the  patient to ask questions, all of which were answered to the patient's satisfaction. Melinda [name] has indicated that she wanted to continue with the procedure.  Consent Attestation: I, the ordering provider, attest that I have discussed with the patient the benefits, risks, side-effects, alternatives, likelihood of achieving goals, and potential problems during recovery for the procedure that I have provided informed consent.  Pre-Procedure Preparation:  Safety Precautions: Allergies reviewed. The patient was asked about blood thinners, or active infections, both of which were denied. The patient was asked to confirm the procedure and laterality, before marking the site, and again before commencing the procedure. Appropriate site, procedure, and patient were confirmed by following the Joint Commission's Universal Protocol (UP.01.01.01), in the form of a "Time Out". The patient was asked to participate by confirming the accuracy of the "Time Out" information. Patient was assessed for positional comfort and pressure points before starting the procedure. Allergies: She has No Known Allergies.. Allergy Precautions: None required Infection Control Precautions: Sterile technique used. Standard Universal Precautions were taken as recommended by the Department of [facility] for Disease Control and Prevention (CDC). Standard pre-surgical skin prep was conducted. Respiratory hygiene and cough etiquette was practiced. Hand hygiene observed. Safe injection practices and needle disposal techniques followed. SDV (single dose vial) medications used. Medications properly checked for expiration dates and contaminants. Personal protective equipment (PPE) used as per protocol. Monitoring:  As per clinic protocol. Vitals:   04/19/16 1103  BP: (!) 96/53  Pulse: 82  Resp: 16  Temp: 98.2 F (36.8 C)  TempSrc: Oral  SpO2: 97%  Weight: 220 lb (99.8 kg)  Height: _0  (1.753 m)  Calculated BMI: Body mass index  is 32.49 kg/m. Time-out: "Time-out" completed before starting procedure, as per protocol.  Intrathecal Pump Therapy Assessment  Pump Type: Programmable Pump Volume: 40 mL reservoir Side-effects or Adverse reactions: None reported Effectiveness: Described as relatively effective, allowing for increase in activities of daily living (  ADL) Primary Medication Class: Opioid Primary Opioid: PF-Fentanyl Primary Local Anesthetic: PF-Bupivacaine Medication Change: None at this point Rate Change: No change in rate Infusion Type: Simple continuous Plan: Pump refill today   Description of Procedure Process:   Time-out: "Time-out" completed before starting procedure, as per protocol. Position: Supine Target Area: Central-port of intrathecal pump. Approach: Anterior, 90 degree angle approach. Area Prepped: Entire Area around the pump implant. Prepping solution: ChloraPrep (2% chlorhexidine gluconate and 70% isopropyl alcohol) Safety Precautions: Aspiration looking for blood return was conducted prior to all injections. At no point did we inject any substances, as a needle was being advanced. No attempts were made at seeking any paresthesias. Safe injection practices and needle disposal techniques used. Medications properly checked for expiration dates. SDV (single dose vial) medications used. Description of the Procedure: Protocol guidelines were followed. Two nurses trained to do implant refills were present during the entire procedure. The refill medication was checked by both healthcare providers as well as the patient. The patient was included in the "Time-out" to verify the medication. The patient was placed in position. The pump was identified. The area was prepped in the usual manner. The sterile template was positioned over the pump, making sure the side-port location matched that of the pump. Both, the pump and the template were held for stability. The needle provided in the Medtronic Kit was then  introduced thru the center of the template and into the central port. The pump content was aspirated and discarded volume documented. The new medication was slowly infused into the pump, thru the filter, making sure to avoid overpressure of the device. The needle was then removed and the area cleansed, making sure to leave some of the prepping solution back to take advantage of its long term bactericidal properties. The pump was interrogated and programmed to reflect the correct medication, volume, and dosage. The program was printed and taken to the physician for approval. Once checked and signed by the physician, a copy was provided to the patient and another scanned into the EMR. EBL: None Materials & Medications: Medtronic Refill Kit Medication(s): Please see chart orders for details.  Imaging Guidance:  Type of Imaging Technique: None used Indication(s): N/A Exposure Time: No patient exposure Contrast: None used. Fluoroscopic Guidance: N/A Ultrasound Guidance: N/A Interpretation: N/A   Antibiotic Prophylaxis:  Indication(s): No indications identified. Type:  Antibiotics Given (last 72 hours)    None      Post-operative Assessment:  Complications: No immediate post-treatment complications observed by team, or reported by patient. Disposition: The patient tolerated the entire procedure well. A repeat set of vitals were taken after the procedure and the patient was kept under observation following institutional policy, for this type of procedure. Post-procedural neurological assessment was performed, showing return to baseline, prior to discharge. The patient was provided with post-procedure discharge instructions, including a section on how to identify potential problems. Should any problems arise concerning this procedure, the patient was given instructions to immediately contact us, at any time, without hesitation. In any case, we plan to contact the patient by telephone for a follow-up  status report regarding this interventional procedure. Comments:  No additional relevant information.  Controlled Substance Pharmacotherapy Assessment REMS (Risk Evaluation and Mitigation Strategy)  Analgesic: Oxycodone IR 5 mg every 8 hours (15 mg/day) MME/day: 22 mg/day of oral medication. Patterson,Delores G, RN  04/19/2016 11:15 AM  Sign at close encounter Nursing Pain Medication Assessment:  Safety precautions to be maintained throughout the outpatient stay will  include: orient to surroundings, keep bed in low position, maintain call bell within reach at all times, provide assistance with transfer out of bed and ambulation.  Medication Inspection Compliance: Pill count conducted under aseptic conditions, in front of the patient. Neither the pills nor the bottle was removed from the patient's sight at any time. Once count was completed pills were immediately returned to the patient in their original bottle. Pill Count: 49 of 90 pills remain Bottle Appearance: Standard pharmacy container. Clearly labeled. Medication: Oxycodone IR Filled Date: 09 / 27 / 2017 Pharmacokinetics: Liberation and absorption (onset of action): WNL Distribution (time to peak effect): WNL Metabolism and excretion (duration of action): WNL         Pharmacodynamics: Desired effects: Analgesia: The patient reports >50% benefit. Reported improvement in function: The patient reports medication allows her to accomplish basic ADLs. Clinically meaningful improvement in function (CMIF): Sustained CMIF goals met Perceived effectiveness: Described as relatively effective, allowing for increase in activities of daily living (ADL) Undesirable effects: Side-effects or Adverse reactions: None reported Monitoring: [place] PMP: Online review of the past 44-monthperiod conducted. Compliant with practice rules and regulations List of all UDS test(s) done:  Lab Results  Component Value Date   TOXASSSELUR FINAL 10/18/2015    TOXASSSELUR FINAL 07/27/2015   Last UDS on record: ToxAssure Select 13  Date Value Ref Range Status  10/18/2015 FINAL  Final    Comment:    ==================================================================== TOXASSURE SELECT 13 (MW) ==================================================================== Test                             Result       Flag       Units Drug Present and Declared for Prescription Verification   Oxycodone                      753          EXPECTED   ng/mg creat   Oxymorphone                    98           EXPECTED   ng/mg creat   Noroxycodone                   3344         EXPECTED   ng/mg creat    Sources of oxycodone include scheduled prescription medications.    Oxymorphone and noroxycodone are expected metabolites of    oxycodone. Oxymorphone is also available as a scheduled    prescription medication. Drug Present not Declared for Prescription Verification   Fentanyl                       14           UNEXPECTED ng/mg creat   Norfentanyl                    47           UNEXPECTED ng/mg creat    Source of fentanyl is a scheduled prescription medication,    including IV, patch, and transmucosal formulations. Norfentanyl    is an expected metabolite of fentanyl. ==================================================================== Test                      Result    Flag   Units  Ref Range   Creatinine              246              mg/dL      >=20 ==================================================================== Declared Medications:  The flagging and interpretation on this report are based on the  following declared medications.  Unexpected results may arise from  inaccuracies in the declared medications.  **Note: The testing scope of this panel includes these medications:  Oxycodone  **Note: The testing scope of this panel does not include following  reported medications:  Aspirin  Bupropion (Wellbutrin)  Ibuprofen  Isosorbide   Nitroglycerin  Paroxetine (Paxil)  Polyethylene Glycol  Pregabalin (Lyrica)  Rosuvastatin (Crestor)  Sulfamethoxazole (Trimethoprim-Sulfamethoxazole)  Tizanidine  Trimethoprim (Trimethoprim-Sulfamethoxazole) ==================================================================== For clinical consultation, please call [phone]. ====================================================================    UDS interpretation: Compliant          Medication Assessment Form: Reviewed. Patient indicates being compliant with therapy Treatment compliance: Compliant Risk Assessment Profile: Aberrant behavior: See prior evaluations. None observed or detected today Comorbid factors increasing risk of overdose: See prior notes. No additional risks detected today Risk of substance use disorder (SUD): Low Opioid Risk Tool (ORT) Total Score: 6  Interpretation Table:  Score <3 = Low Risk for SUD  Score between 4-7 = Moderate Risk for SUD  Score >8 = High Risk for Opioid Abuse   Risk Mitigation Strategies:  Patient Counseling:  Covered Patient-Prescriber Agreement (PPA): Present and active  Notification to other healthcare providers: Done  Pharmacologic Plan: No change in therapy, at this time  Laboratory Chemistry  Inflammation Markers Lab Results  Component Value Date   ESRSEDRATE 16 09/18/2010   CRP 0.2 09/18/2010   Renal Function Lab Results  Component Value Date   BUN 27 (H) 02/21/2016   CREATININE 0.94 02/21/2016   GFRAA >60 09/10/2015   GFRNONAA >60 09/10/2015   Hepatic Function Lab Results  Component Value Date   AST 20 02/21/2016   ALT 22 02/21/2016   ALBUMIN 4.3 02/21/2016   Electrolytes Lab Results  Component Value Date   NA 142 02/21/2016   K 4.6 02/21/2016   CL 106 02/21/2016   CALCIUM 8.8 02/21/2016   MG 2.1 06/30/2013   Pain Modulating Vitamins Lab Results  Component Value Date   VITAMINB12 319 10/04/2013   Coagulation Parameters Lab Results   Component Value Date   INR 1.12 09/11/2015   LABPROT 14.6 09/11/2015   APTT 31 09/11/2015   PLT 253.0 02/21/2016   Cardiovascular Lab Results  Component Value Date   HGB 12.5 02/21/2016   HCT 37.5 02/21/2016   Note: Lab results reviewed.  Assessment  Primary Diagnosis & Pertinent Problem List: The primary encounter diagnosis was Chronic pain syndrome. Diagnoses of Chronic neck pain, Fibromyalgia, Long term current use of opiate analgesic, Long term prescription opiate use, Opiate use, Presence of functional implant (Medtronic programmable intrathecal pump) (Right abdominal area), Encounter for adjustment or management of infusion pump, Presence of intrathecal pump (Medtronic intrathecal programmable pump) (40 mL pump), Neuropathic pain, Primary osteoarthritis involving multiple joints, Musculoskeletal pain, and Failed back surgical syndrome were also pertinent to this visit.  Visit Diagnosis: 1. Chronic pain syndrome   2. Chronic neck pain   3. Fibromyalgia   4. Long term current use of opiate analgesic   5. Long term prescription opiate use   6. Opiate use   7. Presence of functional implant (Medtronic programmable intrathecal pump) (Right abdominal area)   8. Encounter for  adjustment or management of infusion pump   9. Presence of intrathecal pump (Medtronic intrathecal programmable pump) (40 mL pump)   10. Neuropathic pain   11. Primary osteoarthritis involving multiple joints   12. 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 tiZANidine (ZANAFLEX) 4 MG tablet    Sig: Take 1 tablet (4 mg total) by mouth every 6 (six) hours as needed.    Dispense:  120 tablet    Refill:  2    Do not place this medication, or any other prescription from our practice, on "Automatic Refill". Patient may have prescription filled one day early if pharmacy  is closed on scheduled refill date.  . pregabalin (LYRICA) 100 MG capsule    Sig: Take 1 capsule (100 mg total) by mouth 3 (three) times daily.    Dispense:  270 capsule    Refill:  0    Do not place this medication, or any other prescription from our practice, on "Automatic Refill". Patient may have prescription filled one day early if pharmacy is closed on scheduled refill date.  Marland Kitchen oxyCODONE (OXY IR/ROXICODONE) 5 MG immediate release tablet    Sig: Take 1 tablet (5 mg total) by mouth every 8 (eight) hours as needed for severe pain.    Dispense:  90 tablet    Refill:  0    Do not place this medication, or any other prescription from our practice, on "Automatic Refill". Patient may have prescription filled one day early if pharmacy is closed on scheduled refill date. Do not fill until: 04/20/16 To last until: 05/20/16  . oxyCODONE (OXY IR/ROXICODONE) 5 MG immediate release tablet    Sig: Take 1 tablet (5 mg total) by mouth every 8 (eight) hours as needed for severe pain.    Dispense:  90 tablet    Refill:  0    Do not place this medication, or any other prescription from our practice, on "Automatic Refill". Patient may have prescription filled one day early if pharmacy is closed on scheduled refill date. Do not fill until: 05/20/16 To last until: 06/19/16  . oxyCODONE (OXY IR/ROXICODONE) 5 MG immediate release tablet    Sig: Take 1 tablet (5 mg total) by mouth every 8 (eight) hours as needed for severe pain.    Dispense:  90 tablet    Refill:  0    Do not place this medication, or any other prescription from our practice, on "Automatic Refill". Patient may have prescription filled one day early if pharmacy is closed on scheduled refill date. Do not fill until: 06/19/16 To last until: 07/19/16   Medications administered: (For more details, see medical record) Ms. Cagley had no medications administered during this visit.  Imaging Ordered: No results found for this or any previous  visit. New Prescriptions   No medications on file   Physician-requested Follow-up:  Return for Pump Refill (as per program).  Future Appointments Date Time Provider [place]  05/21/2016 9:00 AM Jerline Pain, MD [facility]  07/03/2016 11:15 AM [name], MD [facility] None  08/08/2016 11:00 AM [name], MD [facility] None   Primary Care Physician: [name], NP Location: [facility] Note by: Kathlen Brunswick. Dossie Arbour, M.D, DABA, DABAPM, DABPM, DABIPP, FIPP  Disclaimer:  Medicine is not an exact science. The only guarantee in medicine is that  nothing is guaranteed. It is important to note that the decision to proceed with this intervention was based on the information collected from the patient. The Data and conclusions were drawn from the patient's questionnaire, the interview, and the physical examination. Because the information was provided in large part by the patient, it cannot be guaranteed that it has not been purposely or unconsciously manipulated. Every effort has been made to obtain as much relevant data as possible for this evaluation. It is important to note that the conclusions that lead to this procedure are derived in large part from the available data. Always take into account that the treatment will also be dependent on availability of resources and existing treatment guidelines, considered by other Pain Management Practitioners as being common knowledge and practice, at the time of the intervention. For Medico-Legal purposes, it is also important to point out that variation in procedural techniques and pharmacological choices are the acceptable norm. The indications, contraindications, technique, and results of the above procedure should only be interpreted and judged by a Board-Certified Interventional Pain Specialist with extensive familiarity and expertise in the same exact procedure and technique. Attempts at  providing opinions without similar or greater experience and expertise than that of the treating physician will be considered as inappropriate and unethical, and shall result in a formal complaint to the state medical board and applicable specialty societies.  Instructions provided at this appointment: Patient Instructions  Narcotic Overdose A narcotic overdose is the misuse or overuse of a narcotic drug. A narcotic overdose can make you pass out and stop breathing. If you are not treated right away, this can cause permanent brain damage or stop your heart. Medicine may be given to reverse the effects of an overdose. If so, this medicine may bring on withdrawal symptoms. The symptoms may be abdominal cramps, throwing up (vomiting), sweating, chills, and nervousness. Injecting narcotics can cause more problems than just an overdose. AIDS, hepatitis, and other very serious infections are transmitted by sharing needles and syringes. If you decide to quit using, there are medicines which can help you through the withdrawal period. Trying to quit all at once on your own can be uncomfortable, but not life-threatening. Call your caregiver, Narcotics Anonymous, or any drug and alcohol treatment program for further help.    This information is not intended to replace advice given to you by your health care provider. Make sure you discuss any questions you have with your health care provider.   Document Released: 07/26/2004 Document Revised: 07/09/2014 Document Reviewed: 12/08/2014 Elsevier Interactive Patient Education [employer].

## 2016-04-19 NOTE — Patient Instructions (Signed)
Narcotic Overdose °A narcotic overdose is the misuse or overuse of a narcotic drug. A narcotic overdose can make you pass out and stop breathing. If you are not treated right away, this can cause permanent brain damage or stop your heart. Medicine may be given to reverse the effects of an overdose. If so, this medicine may bring on withdrawal symptoms. The symptoms may be abdominal cramps, throwing up (vomiting), sweating, chills, and nervousness. °Injecting narcotics can cause more problems than just an overdose. AIDS, hepatitis, and other very serious infections are transmitted by sharing needles and syringes. If you decide to quit using, there are medicines which can help you through the withdrawal period. Trying to quit all at once on your own can be uncomfortable, but not life-threatening. Call your caregiver, Narcotics Anonymous, or any drug and alcohol treatment program for further help.  °  °This information is not intended to replace advice given to you by your health care provider. Make sure you discuss any questions you have with your health care provider. °  °Document Released: 07/26/2004 Document Revised: 07/09/2014 Document Reviewed: 12/08/2014 °Elsevier Interactive Patient Education ©2016 [employer] ° °

## 2016-04-21 DIAGNOSIS — F1190 Opioid use, unspecified, uncomplicated: Secondary | ICD-10-CM | POA: Insufficient documentation

## 2016-04-21 DIAGNOSIS — G8929 Other chronic pain: Secondary | ICD-10-CM | POA: Insufficient documentation

## 2016-04-21 DIAGNOSIS — Z451 Encounter for adjustment and management of infusion pump: Secondary | ICD-10-CM | POA: Insufficient documentation

## 2016-04-21 DIAGNOSIS — M7918 Myalgia, other site: Secondary | ICD-10-CM | POA: Insufficient documentation

## 2016-04-21 DIAGNOSIS — Z79891 Long term (current) use of opiate analgesic: Secondary | ICD-10-CM | POA: Insufficient documentation

## 2016-04-21 DIAGNOSIS — M159 Polyosteoarthritis, unspecified: Secondary | ICD-10-CM | POA: Insufficient documentation

## 2016-04-23 MED FILL — Medication: INTRATHECAL | Qty: 1 | Status: AC

## 2016-04-26 ENCOUNTER — Ambulatory Visit: Payer: Medicare Other | Admitting: Internal Medicine

## 2016-04-26 DIAGNOSIS — M25661 Stiffness of right knee, not elsewhere classified: Secondary | ICD-10-CM | POA: Diagnosis not present

## 2016-04-26 DIAGNOSIS — M25561 Pain in right knee: Secondary | ICD-10-CM | POA: Diagnosis not present

## 2016-04-26 DIAGNOSIS — M1711 Unilateral primary osteoarthritis, right knee: Secondary | ICD-10-CM | POA: Diagnosis not present

## 2016-05-02 DIAGNOSIS — M25661 Stiffness of right knee, not elsewhere classified: Secondary | ICD-10-CM | POA: Diagnosis not present

## 2016-05-02 DIAGNOSIS — M1711 Unilateral primary osteoarthritis, right knee: Secondary | ICD-10-CM | POA: Diagnosis not present

## 2016-05-02 DIAGNOSIS — M25561 Pain in right knee: Secondary | ICD-10-CM | POA: Diagnosis not present

## 2016-05-08 DIAGNOSIS — M25661 Stiffness of right knee, not elsewhere classified: Secondary | ICD-10-CM | POA: Diagnosis not present

## 2016-05-08 DIAGNOSIS — M1711 Unilateral primary osteoarthritis, right knee: Secondary | ICD-10-CM | POA: Diagnosis not present

## 2016-05-08 DIAGNOSIS — M25561 Pain in right knee: Secondary | ICD-10-CM | POA: Diagnosis not present

## 2016-05-10 ENCOUNTER — Encounter: Payer: Medicare Other | Admitting: Internal Medicine

## 2016-05-18 ENCOUNTER — Ambulatory Visit (INDEPENDENT_AMBULATORY_CARE_PROVIDER_SITE_OTHER): Payer: Medicare Other | Admitting: Internal Medicine

## 2016-05-18 ENCOUNTER — Ambulatory Visit (INDEPENDENT_AMBULATORY_CARE_PROVIDER_SITE_OTHER)
Admission: RE | Admit: 2016-05-18 | Discharge: 2016-05-18 | Disposition: A | Discharge status: Home / Self Care | Payer: Medicare Other | Source: Ambulatory Visit | Attending: Internal Medicine | Admitting: Internal Medicine

## 2016-05-18 ENCOUNTER — Encounter: Payer: Self-pay | Admitting: Internal Medicine

## 2016-05-18 VITALS — BP 130/78 | HR 78 | Temp 98.9°F | Ht 69.0 in | Wt 227.0 lb

## 2016-05-18 DIAGNOSIS — I208 Other forms of angina pectoris: Secondary | ICD-10-CM | POA: Diagnosis not present

## 2016-05-18 DIAGNOSIS — Z0000 Encounter for general adult medical examination without abnormal findings: Secondary | ICD-10-CM | POA: Diagnosis not present

## 2016-05-18 DIAGNOSIS — I2510 Atherosclerotic heart disease of native coronary artery without angina pectoris: Secondary | ICD-10-CM | POA: Diagnosis not present

## 2016-05-18 DIAGNOSIS — I2583 Coronary atherosclerosis due to lipid rich plaque: Secondary | ICD-10-CM | POA: Diagnosis not present

## 2016-05-18 DIAGNOSIS — E7800 Pure hypercholesterolemia, unspecified: Secondary | ICD-10-CM

## 2016-05-18 DIAGNOSIS — M150 Primary generalized (osteo)arthritis: Secondary | ICD-10-CM | POA: Diagnosis not present

## 2016-05-18 DIAGNOSIS — E1142 Type 2 diabetes mellitus with diabetic polyneuropathy: Secondary | ICD-10-CM | POA: Diagnosis not present

## 2016-05-18 DIAGNOSIS — M797 Fibromyalgia: Secondary | ICD-10-CM

## 2016-05-18 DIAGNOSIS — Z23 Encounter for immunization: Secondary | ICD-10-CM

## 2016-05-18 DIAGNOSIS — M5431 Sciatica, right side: Secondary | ICD-10-CM

## 2016-05-18 DIAGNOSIS — F329 Major depressive disorder, single episode, unspecified: Secondary | ICD-10-CM

## 2016-05-18 DIAGNOSIS — Z1159 Encounter for screening for other viral diseases: Secondary | ICD-10-CM

## 2016-05-18 DIAGNOSIS — M8949 Other hypertrophic osteoarthropathy, multiple sites: Secondary | ICD-10-CM

## 2016-05-18 DIAGNOSIS — M159 Polyosteoarthritis, unspecified: Secondary | ICD-10-CM

## 2016-05-18 DIAGNOSIS — M5136 Other intervertebral disc degeneration, lumbar region: Secondary | ICD-10-CM | POA: Diagnosis not present

## 2016-05-18 LAB — MICROALBUMIN / CREATININE URINE RATIO
Creatinine,U: 154.8 mg/dL
Microalb Creat Ratio: 1 mg/g (ref 0.0–30.0)
Microalb, Ur: 1.5 mg/dL (ref 0.0–1.9)

## 2016-05-18 LAB — LIPID PANEL
Cholesterol: 133 mg/dL (ref 0–200)
HDL: 37.2 mg/dL — ABNORMAL LOW (ref >39.00)
NonHDL: 96.22
Total CHOL/HDL Ratio: 4
Triglycerides: 205 mg/dL — ABNORMAL HIGH (ref 0.0–149.0)
VLDL: 41 mg/dL — ABNORMAL HIGH (ref 0.0–40.0)

## 2016-05-18 LAB — CBC
HCT: 38.9 % (ref 36.0–46.0)
Hemoglobin: 13.2 g/dL (ref 12.0–15.0)
MCHC: 34 g/dL (ref 30.0–36.0)
MCV: 85 fl (ref 78.0–100.0)
Platelets: 254 10*3/uL (ref 150.0–400.0)
RBC: 4.58 Mil/uL (ref 3.87–5.11)
RDW: 12.8 % (ref 11.5–15.5)
WBC: 7 10*3/uL (ref 4.0–10.5)

## 2016-05-18 LAB — COMPREHENSIVE METABOLIC PANEL
ALT: 18 U/L (ref 0–35)
AST: 20 U/L (ref 0–37)
Albumin: 4.8 g/dL (ref 3.5–5.2)
Alkaline Phosphatase: 86 U/L (ref 39–117)
BUN: 28 mg/dL — ABNORMAL HIGH (ref 6–23)
CO2: 30 mEq/L (ref 19–32)
Calcium: 9.6 mg/dL (ref 8.4–10.5)
Chloride: 104 mEq/L (ref 96–112)
Creatinine, Ser: 1.09 mg/dL (ref 0.40–1.20)
GFR: 54.67 mL/min — ABNORMAL LOW (ref >60.00)
Glucose, Bld: 77 mg/dL (ref 70–99)
Potassium: 4.4 mEq/L (ref 3.5–5.1)
Sodium: 141 mEq/L (ref 135–145)
Total Bilirubin: 0.4 mg/dL (ref 0.2–1.2)
Total Protein: 7.7 g/dL (ref 6.0–8.3)

## 2016-05-18 LAB — HEMOGLOBIN A1C: Hgb A1c MFr Bld: 6 % (ref 4.6–6.5)

## 2016-05-18 LAB — LDL CHOLESTEROL, DIRECT: Direct LDL: 70 mg/dL

## 2016-05-18 MED ORDER — MELOXICAM 15 MG PO TABS: 15.0000 mg | ORAL_TABLET | Freq: Every day | ORAL | 2 refills | Status: DC

## 2016-05-18 NOTE — Progress Notes (Signed)
HPI:  Pt presents to the clinic today for her Medicare Wellness Exam. She is also due to follow up chronic conditions.  HLD with CAD: Her last LDL was 62. She denies angina. She follows with Dr. Marlou Porch and reports she has a follow up with him on 11/20. She is taking Crestor as prescribed, and denies myalgias. She is taking Isosorbide, Fish Oil and CoQ10.  Chronic pain secondary to DDD/spinal stenosis: She follows with Dr. Wynona Canes. She has an internal Fentanyl pain pump. She also takes Zanaflex, Meloxicam, Lyrica and Oxycodone as prescribed. She reports worsening low back pain. This pain radiates into her right lower leg. She reports numbness, tingling and weakness to the point she has to walk with a cane. She reports her current meds are not working.  DM 2 with neuropathy: Her last A1C was 6.3%. Her diabetes is diet controlled. She does not check her sugars. She takes Lyrica for neuropathy. She gets her eye exam yearly. Flu 07/2015. Pneumovax 07/2015  Fibromyalgia: She has chronic pain. She follows with Dr. Wynona Canes. She takes Zanaflex, Meloxicam, Lyrica and Oxycodone in addition to an internal pain pump.  OSA: She is getting about 6 hours of sleep per night with CPAP.  Depression: Secondary to chronic conditions. She takes Paxil and Wellbutrin with good relief. She denies anxiety, SI/HI.   Past Medical History:  Diagnosis Date  . Anemia    years ago  . Bulging disc   . CAD (coronary artery disease) 2009   s/p stent to LAD  . 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 Chicken pox   . Coronary arteritis   . Degenerative disc disease   . Diabetes mellitus without complication ([place])   . Facet joint disease   . Fibromyalgia   . Heart disease   . Hyperlipidemia   . MCL deficiency, knee   . MRSA (methicillin resistant staph aureus) culture positive 2011   GREAT TOE RIGHT FOOT  . Neuropathy ([place]) 04/18/2010  . Peripheral neuropathy ([place])   . Sleep apnea    uses CPAP, sleep  study at [facility]  . Spinal stenosis     Current Outpatient Prescriptions  Medication Sig Dispense Refill  . ACCU-CHEK AVIVA PLUS test strip TEST 2 TIMES DAY AS NEEDED **E11.9** 100 each 6  . ACCU-CHEK SOFTCLIX LANCETS lancets 1 each by Other route 2 (two) times daily. Use as instructed 100 each 2  . AMBULATORY NON FORMULARY MEDICATION Medication Name: CPAP MASK OF CHOICE FOR HOME DEVICE 1 each 0  . aspirin 81 MG tablet Take 81 mg by mouth daily.    . Blood Glucose Calibration (ACCU-CHEK AVIVA) SOLN 1 each by In Vitro route as needed. (Patient taking differently: 1 each by Other route See admin instructions. Check blood sugar once daily.) 1 each 1  . Blood Glucose Monitoring Suppl (ACCU-CHEK AVIVA PLUS) W/DEVICE KIT 1 Device by Does not apply route once. (Patient taking differently: 1 each by Other route See admin instructions. Check blood sugar once daily.) 1 kit 0  . buPROPion (WELLBUTRIN XL) 150 MG 24 hr tablet TAKE 1 TABLET BY MOUTH DAILY 90 tablet 2  . Cholecalciferol (VITAMIN D3) 2000 units TABS Take 2,000 Units by mouth daily.    . Chromium Picolinate 800 MCG TABS Take 800 mcg by mouth daily.    . Coenzyme Q10 (CO Q 10) 100 MG CAPS Take 200 Doses by mouth.    . Ginger, Zingiber officinalis, (GINGER ROOT PO) Take 1.1 g by  mouth daily.    Marland Kitchen glucosamine-chondroitin 500-400 MG tablet Take 1,500 tablets by mouth 3 (three) times daily.    . isosorbide dinitrate (ISORDIL) 30 MG tablet Take 1 tablet (30 mg total) by mouth daily. 30 tablet 1  . LYRICA 100 MG capsule     . MAGNESIUM MALATE PO Take 115 mg by mouth daily.    . meloxicam (MOBIC) 15 MG tablet Take 15 mg by mouth daily.    . Multiple Vitamin (MULTIVITAMIN) tablet Take 1 tablet by mouth daily.    . nitroGLYCERIN (NITROSTAT) 0.4 MG SL tablet Place 1 tablet (0.4 mg total) under the tongue every 5 (five) minutes as needed for chest pain. 25 tablet 2  . Nutritional Supplements (GRAPESEED EXTRACT PO) Take 400 mg by mouth daily.     . Omega-3 Fatty Acids (FISH OIL) 1200 MG CAPS Take by mouth.    . oxyCODONE (OXY IR/ROXICODONE) 5 MG immediate release tablet Take 1 tablet (5 mg total) by mouth every 8 (eight) hours as needed for severe pain. 90 tablet 0  . [START ON 05/20/2016] oxyCODONE (OXY IR/ROXICODONE) 5 MG immediate release tablet Take 1 tablet (5 mg total) by mouth every 8 (eight) hours as needed for severe pain. 90 tablet 0  . [START ON 06/19/2016] oxyCODONE (OXY IR/ROXICODONE) 5 MG immediate release tablet Take 1 tablet (5 mg total) by mouth every 8 (eight) hours as needed for severe pain. 90 tablet 0  . PARoxetine (PAXIL) 20 MG tablet TAKE ONE-HALF (1/2) OF A TABLET BY MOUTHTWICE DAILY 90 tablet 0  . polyethylene glycol (MIRALAX / GLYCOLAX) packet Take 17 g by mouth daily.    . pregabalin (LYRICA) 100 MG capsule Take 1 capsule (100 mg total) by mouth 3 (three) times daily. 270 capsule 0  . rosuvastatin (CRESTOR) 20 MG tablet Take 1 tablet (20 mg total) by mouth daily. 30 tablet 1  . tiZANidine (ZANAFLEX) 4 MG tablet Take 1 tablet (4 mg total) by mouth every 6 (six) hours as needed. 120 tablet 2  . vitamin E 400 UNIT capsule Take 400 Units by mouth daily.    Marland Kitchen PAIN MANAGEMENT IT PUMP REFILL 1 each by Intrathecal route once. Medication: PF Fentanyl 500.0 mcg/ml PF Baclofen 300.25mg/ml PF Bupivicaine 20.0 mg/ml Total Volume: 40 ml Needed by 04-19-16 _0  1 each 0   No current facility-administered medications for this visit.     No Known Allergies  Family History  Problem Relation Age of Onset  . Cancer Mother     lung  . Diabetes Father   . Heart disease Maternal Grandfather   . Diabetes Paternal Grandmother   . Cancer Paternal Grandfather     colon  . Stroke Neg Hx     Social History   Social History  . Marital status: Significant Other    Spouse name: N/A  . Number of children: N/A  . Years of education: N/A   Occupational History  . Not on file.   Social History Main Topics  . Smoking  status: Former Smoker    Packs/day: 1.50    Years: 32.00    Types: Cigarettes    Quit date: 07/22/2007  . Smokeless tobacco: Never Used  . Alcohol use 0.0 oz/week     Comment: occ  . Drug use: No  . Sexual activity: Yes   Other Topics Concern  . Not on file   Social History Narrative  . No narrative on file    Hospitiliaztions: None  Health Maintenance:  Flu: 07/2015  Tetanus: 08/2007  Pneumovax: 07/2015  Mammogram: 03/2015  Pap Smear: > 5 years ago  Colon Screening: 2011  Eye Doctor: annually  Dental Exam:as needed   Providers:   PCP: [name], NP-C  Pain Management: Dr. Daleen Squibb  Podiatry: Dr. Paulla Dolly  Orthopedist: Dr. Lynann Bologna  Cardiologist: Dr. Marlou Porch  Orthopedics: Dr. Latanya Maudlin  Pulmonologist: Dr. Ashby Dawes   I have personally reviewed and have noted:  1. The patient's medical and social history 2. Their use of alcohol, tobacco or illicit drugs 3. Their current medications and supplements 4. The patient's functional ability including ADL's, fall risks, home safety risks and hearing or visual impairment. 5. Diet and physical activities 6. Evidence for depression or mood disorder  Subjective:   Review of Systems:   Constitutional: Denies fever, malaise, fatigue, headache or abrupt weight changes.  HEENT: Denies eye pain, eye redness, ear pain, ringing in the ears, wax buildup, runny nose, nasal congestion, bloody nose, or sore throat. Respiratory: Denies difficulty breathing, shortness of breath, cough or sputum production.   Cardiovascular: Denies chest pain, chest tightness, palpitations or swelling in the hands or feet.  Gastrointestinal: Denies abdominal pain, bloating, constipation, diarrhea or blood in the stool.  GU: Denies urgency, frequency, pain with urination, burning sensation, blood in urine, odor or discharge. Musculoskeletal: Pt reports chronic joint and muscle pain.  Skin: Denies redness, rashes, lesions or ulcercations.  Neurological:  Denies dizziness, difficulty with memory, difficulty with speech or problems with balance and coordination.  Psych: t reports history of depression. Denies anxiety, SI/HI.  No other specific complaints in a complete review of systems (except as listed in HPI above).  Objective:  PE:   BP 130/78   Pulse 78   Temp 98.9 F (37.2 C) (Oral)   Ht _0  (1.753 m)   Wt 227 lb (103 kg)   SpO2 97%   BMI 33.52 kg/m   Wt Readings from Last 3 Encounters:  05/18/16 227 lb (103 kg)  04/19/16 220 lb (99.8 kg)  02/21/16 224 lb (101.6 kg)    General: Appears her stated age, obese in NAD. Cardiovascular: Normal rate and rhythm. S1,S2 noted.  No murmur, rubs or gallops noted. No JVD or BLE edema.  Pulmonary/Chest: Normal effort and positive vesicular breath sounds. No respiratory distress. No wheezes, rales or ronchi noted.  Musculoskeletal: Decreased flexion, extension of the spine. Normal rotation. Bony tenderness noted over the lumbar spine. Strength 4/5 RLE, 5/5 LLE.  Neurological: Alert and oriented. Positive SLR on the right. Sensation decreased in BLE. Psychiatric: Mood and affect flat.  BMET    Component Value Date/Time   NA 142 02/21/2016 1045   NA 135 (L) 06/30/2013 1203   K 4.6 02/21/2016 1045   K 4.1 06/30/2013 1203   CL 106 02/21/2016 1045   CL 102 06/30/2013 1203   CO2 30 02/21/2016 1045   CO2 32 06/30/2013 1203   GLUCOSE 124 (H) 02/21/2016 1045   GLUCOSE 143 (H) 06/30/2013 1203   BUN 27 (H) 02/21/2016 1045   BUN 19 (H) 06/30/2013 1203   CREATININE 0.94 02/21/2016 1045   CREATININE 0.89 06/30/2013 1203   CALCIUM 8.8 02/21/2016 1045   CALCIUM 9.7 06/30/2013 1203   GFRNONAA >60 09/10/2015 1751   GFRNONAA >60 06/30/2013 1203   GFRAA >60 09/10/2015 1751   GFRAA >60 06/30/2013 1203    Lipid Panel     Component Value Date/Time   CHOL 120 09/11/2015 0421   TRIG 86 09/11/2015 0421  HDL 41 09/11/2015 0421   CHOLHDL 2.9 09/11/2015 0421   VLDL 17 09/11/2015 0421    LDLCALC 62 09/11/2015 0421    CBC    Component Value Date/Time   WBC 5.4 02/21/2016 1045   RBC 4.38 02/21/2016 1045   HGB 12.5 02/21/2016 1045   HGB 14.0 11/12/2011 1136   HCT 37.5 02/21/2016 1045   HCT 40.7 11/12/2011 1136   PLT 253.0 02/21/2016 1045   PLT 274 11/12/2011 1136   MCV 85.6 02/21/2016 1045   MCV 86 11/12/2011 1136   MCH 28.3 09/10/2015 1751   MCHC 33.3 02/21/2016 1045   RDW 13.6 02/21/2016 1045   RDW 12.8 11/12/2011 1136   LYMPHSABS 1.7 07/21/2014 1340   LYMPHSABS 2.5 11/12/2011 1136   MONOABS 0.7 07/21/2014 1340   MONOABS 0.8 11/12/2011 1136   EOSABS 0.3 07/21/2014 1340   EOSABS 0.6 11/12/2011 1136   BASOSABS 0.0 07/21/2014 1340   BASOSABS 0.1 11/12/2011 1136    Hgb A1C Lab Results  Component Value Date   HGBA1C 6.3 (H) 09/11/2015      Assessment and Plan:   Medicare Annual Wellness Visit:  Diet: She does eat meat. She consumes fruits and veggies daily. She does eat some fried food. She drinks mostly water. Physical activity: Sedentary Depression/mood screen: Negative Hearing: Intact to whispered voice Visual acuity: Grossly normal, performs annual eye exam  ADLs: Capable Fall risk: None Home safety: Good Cognitive evaluation: Intact to orientation, naming, recall and repetition EOL planning: No adv directives, full code/ I agree  Preventative Medicine: Flu shot today. Tetanus, pneumovax UTD. She will call to order her mammogram. She will call GYN to set up her pap smear. Colon Screening UTD. Encouraged her to consume a balanced diet and exercise regimen. Advised her to see an eye doctor and dentist annually.  Next appointment: 1 year, Medicare Wellness Exam  Low back pain with right side sciatica:  Xray lumbar spine today May need follow up MRI She will call Dr. Wynona Canes and her neurosurgeon for further recommendations Discussed stretching She will continue current pain regimen at this time   [name], NP

## 2016-05-18 NOTE — Patient Instructions (Signed)

## 2016-05-19 LAB — HEPATITIS C ANTIBODY: HCV Ab: NEGATIVE

## 2016-05-21 ENCOUNTER — Encounter: Payer: Self-pay | Admitting: Internal Medicine

## 2016-05-21 ENCOUNTER — Encounter: Payer: Self-pay | Admitting: Cardiology

## 2016-05-21 ENCOUNTER — Ambulatory Visit (INDEPENDENT_AMBULATORY_CARE_PROVIDER_SITE_OTHER): Payer: Medicare Other | Admitting: Cardiology

## 2016-05-21 ENCOUNTER — Other Ambulatory Visit: Payer: Self-pay | Admitting: *Deleted

## 2016-05-21 VITALS — BP 124/70 | HR 74 | Ht 70.0 in | Wt 230.1 lb

## 2016-05-21 DIAGNOSIS — M545 Low back pain: Secondary | ICD-10-CM | POA: Diagnosis not present

## 2016-05-21 DIAGNOSIS — E7800 Pure hypercholesterolemia, unspecified: Secondary | ICD-10-CM

## 2016-05-21 DIAGNOSIS — I2583 Coronary atherosclerosis due to lipid rich plaque: Secondary | ICD-10-CM

## 2016-05-21 DIAGNOSIS — I208 Other forms of angina pectoris: Secondary | ICD-10-CM | POA: Diagnosis not present

## 2016-05-21 DIAGNOSIS — I6523 Occlusion and stenosis of bilateral carotid arteries: Secondary | ICD-10-CM | POA: Diagnosis not present

## 2016-05-21 DIAGNOSIS — I2510 Atherosclerotic heart disease of native coronary artery without angina pectoris: Secondary | ICD-10-CM

## 2016-05-21 DIAGNOSIS — M9901 Segmental and somatic dysfunction of cervical region: Secondary | ICD-10-CM | POA: Diagnosis not present

## 2016-05-21 DIAGNOSIS — M9903 Segmental and somatic dysfunction of lumbar region: Secondary | ICD-10-CM | POA: Diagnosis not present

## 2016-05-21 DIAGNOSIS — I2089 Other forms of angina pectoris: Secondary | ICD-10-CM

## 2016-05-21 DIAGNOSIS — M542 Cervicalgia: Secondary | ICD-10-CM | POA: Diagnosis not present

## 2016-05-21 MED ORDER — ISOSORBIDE DINITRATE 30 MG PO TABS: 30.0000 mg | ORAL_TABLET | Freq: Every day | ORAL | 3 refills | Status: DC

## 2016-05-21 MED ORDER — ROSUVASTATIN CALCIUM 20 MG PO TABS: 20.0000 mg | ORAL_TABLET | Freq: Every day | ORAL | 3 refills | Status: DC

## 2016-05-21 MED ORDER — NITROGLYCERIN 0.4 MG SL SUBL: 0.4000 mg | SUBLINGUAL_TABLET | SUBLINGUAL | 99 refills | Status: DC | PRN

## 2016-05-21 NOTE — Assessment & Plan Note (Signed)
Follows with pain management She will continue Meloxicam, Fentanyl and Zanaflex

## 2016-05-21 NOTE — Progress Notes (Signed)
[facility] 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 Cardiac catheterization 2012 revealed patent LAD stent.   She underwent a nuclear stress test 2014 which demonstrated mild anterolateral ischemia with normal ejection fraction. This was a very similar finding asked to prior stress test in August of 2012 which resulted in catheterization in August of 2012 which showed patent LAD stent, DES. There was a diagonal branch jailed by stent with minor luminal irregularity at the ostium. EF was normal. Because of these symptoms, I initiated metoprolol extender release 25 mg to see if this would help.  She felt crummy. Fatigue. Unsure if it's her chronic fatigue, fibromyalgia. We decided to initiate isosorbide. She did feel some head rushing sensation she was bending over or wiping her dogs paws. Her heart rate also has not been increasing as well as usual. She also feels more fatigued.   On 10/09/12 I decided to discontinue her metoprolol 25 mg. On Nov 14, 2012 she is here for followup after discontinuation of beta blocker.  02/18/13 - Doing well. Rare palpitation off metop. Taking [name] class. Enjoy.  03/02/14 - Hospitalized with strep infection. Sepsis.  Echocardiogram reassuring. Infectious disease consultation. Much improved. Fever was as high as 105 she says.   Trying to avoid metformin.A1c 6.9. Lost weight. Eating well. Exercising.   06/09/14-had an episode of chest discomfort that was concerning to her. Prompted this office visit today. She had an episode of pain was 6/10, mid low sternum,  tried to stretch, sat down, pain started to lessen took one nitroglycerin with relief, no nausea or vomiting diaphoresis shortness of breath. She noted some pain in the right side of her neck when she bent over. Pounding in right side of neck. Felt like she did prior to stent.  She had another milder episode of chest pain previous evening. Nitroglycerin improved. She was concerned about the sore spot on her neck. It seems to hurt to touch but it is not enlarged.Walking loosing weight. Not under stress.   03/04/15 - Had cervical spine surgery. Left hand first 3 fingers sometimes have tingling or numbness. Question carpal tunnel as well. No chest pain, no shortness of breath.  Wearing knee brace. Unstable. Right sciatica. Rare palps. Usually last seconds in duration. Stress can sometimes trigger. Overall no chest pain, no fevers, no chills, no shortness of breath.   Wt Readings from Last 3 Encounters:  05/21/16 230 lb 1.9 oz (104.4 kg)  05/18/16 227 lb (103 kg)  04/19/16 220 lb (99.8 kg)     Past Medical History:  Diagnosis Date  . Anemia    years ago  . Bulging disc   . CAD (coronary artery disease) 2009   s/p stent to LAD  . 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 Chicken pox   . Coronary arteritis   . Degenerative disc disease   . Diabetes mellitus without complication ([place])   . Facet joint disease   . Fibromyalgia   . Heart disease   . Hyperlipidemia   . MCL  deficiency, knee   . MRSA (methicillin resistant staph aureus) culture positive 2011   GREAT TOE RIGHT FOOT  . Neuropathy ([place]) 04/18/2010  . Peripheral neuropathy ([place])   . Sleep apnea    uses CPAP, sleep [facility] at [facility]  . Spinal stenosis     Past Surgical History:  Procedure Laterality Date  . ABLATION     UTERUS  . ABLATION     HEART  . ANTERIOR CERVICAL DECOMP/DISCECTOMY FUSION N/A 07/28/2014   Procedure: ANTERIOR CERVICAL DECOMPRESSION/DISCECTOMY FUSION CERVICAL 3-4,4-5,5-6 LEVELS WITH  INSTRUMENTATION AND ALLOGRAFT;  Surgeon: Sinclair Ship, MD;  Location: [facility];  Service: Orthopedics;  Laterality: N/A;  Anterior cervical decompression fusion, cervical 3-4, cervical 4-5, cervical 5-6 with instrumentation and allograft  . BACK SURGERY     X 3 1979, 1994, 1995  . CHOLECYSTECTOMY  2003  . EYE SURGERY Bilateral   . FOOT SURGERY Right    BIG TOE  . FOOT SURGERY Bilateral    PLANTAR FASCIITIS  . FOOT SURGERY Right    2ND TOE  . FRACTURE SURGERY Right 2012   carpal tunnel  . GALLBLADDER SURGERY    . HAND SURGERY Right   . HAND SURGEY Left   . HEART STENT  2009   LAD  . INFUSION PUMP IMPLANTATION     X2 with morphine and baclofen    Current Outpatient Prescriptions  Medication Sig Dispense Refill  . ACCU-CHEK AVIVA PLUS test strip TEST 2 TIMES DAY AS NEEDED **E11.9** 100 each 6  . ACCU-CHEK SOFTCLIX LANCETS lancets 1 each by Other route 2 (two) times daily. Use as instructed 100 each 2  . AMBULATORY NON FORMULARY MEDICATION Medication Name: CPAP MASK OF CHOICE FOR HOME DEVICE 1 each 0  . aspirin 81 MG tablet Take 81 mg by mouth daily.    Marland Kitchen buPROPion (WELLBUTRIN XL) 150 MG 24 hr tablet TAKE 1 TABLET BY MOUTH DAILY 90 tablet 2  . Cholecalciferol (VITAMIN D3) 2000 units TABS Take 2,000 Units by mouth daily.    . Chromium Picolinate 800 MCG TABS Take 800 mcg by mouth daily.    . Coenzyme Q10 (CO Q 10) 100 MG CAPS Take 200 Doses by mouth.    . Ginger, Zingiber officinalis, (GINGER ROOT PO) Take 1.1 g by mouth daily.    Marland Kitchen glucosamine-chondroitin 500-400 MG tablet Take 1,500 tablets by mouth 3 (three) times daily.    Marland Kitchen glucose blood test strip 1 each by Other route. Use as instructed    . isosorbide dinitrate (ISORDIL) 30 MG tablet Take 1 tablet (30 mg total) by mouth daily. 90 tablet 3  . MAGNESIUM MALATE PO Take 115 mg by mouth daily.    . meloxicam (MOBIC) 15 MG tablet Take 1 tablet (15 mg total) by mouth daily. 30 tablet 2  . Multiple Vitamin (MULTIVITAMIN) tablet  Take 1 tablet by mouth daily.    . nitroGLYCERIN (NITROSTAT) 0.4 MG SL tablet Place 1 tablet (0.4 mg total) under the tongue every 5 (five) minutes as needed for chest pain. 25 tablet 2  . Nutritional Supplements (GRAPESEED EXTRACT PO) Take 400 mg by mouth daily.    . Omega-3 Fatty Acids (FISH OIL) 1200 MG CAPS Take by mouth.    . oxyCODONE (OXY IR/ROXICODONE) 5 MG immediate release tablet Take 5 mg by mouth 3 (three) times daily.    Marland Kitchen PARoxetine (PAXIL) 20 MG tablet TAKE ONE-HALF (1/2) OF A TABLET BY MOUTHTWICE DAILY 90 tablet 0  . polyethylene  glycol (MIRALAX / GLYCOLAX) packet Take 17 g by mouth daily.    . pregabalin (LYRICA) 100 MG capsule Take 1 capsule (100 mg total) by mouth 3 (three) times daily. 270 capsule 0  . rosuvastatin (CRESTOR) 20 MG tablet Take 1 tablet (20 mg total) by mouth daily. 90 tablet 3  . tiZANidine (ZANAFLEX) 4 MG capsule Take 4 mg by mouth 3 (three) times daily.    . vitamin E 400 UNIT capsule Take 400 Units by mouth daily.    Marland Kitchen PAIN MANAGEMENT IT PUMP REFILL 1 each by Intrathecal route once. Medication: PF Fentanyl 500.0 mcg/ml PF Baclofen 300.69mcg/ml PF Bupivicaine 20.0 mg/ml Total Volume: 40 ml Needed by 04-19-16 @1000  1 each 0   No current facility-administered medications for this visit.     Allergies:   No Known Allergies  Social History:  The patient  reports that she quit smoking about 8 years ago. Her smoking use included Cigarettes. She has a 48.00 pack-year smoking history. She has never used smokeless tobacco. She reports that she drinks alcohol. She reports that she does not use drugs.   Family History  Problem Relation Age of Onset  . Cancer Mother     lung  . Diabetes Father   . Heart disease Maternal Grandfather   . Diabetes Paternal Grandmother   . Cancer Paternal Grandfather     colon  . Stroke Neg Hx     ROS:  Please see the history of present illness.   Denies any fevers, chills, orthopnea, PND, strokelike symptoms, shortness of  breath   All other systems reviewed and negative.   PHYSICAL EXAM: VS:  BP 124/70   Pulse 74   Ht 5\' 10"  (1.778 m)   Wt 230 lb 1.9 oz (104.4 kg)   LMP  (LMP Unknown)   BMI 33.02 kg/m  Well nourished, well developed, in no acute distress  HEENT: normal, Com[place]/AT, EOMI Neck: no JVD, normal carotid upstroke, soft B bruit, she was tender to palpation, pinpoint over carotid artery in mid neck. Cardiac:  normal S1, S2; RRR; no murmur  Lungs:  clear to auscultation bilaterally, no wheezing, rhonchi or rales  Abd: soft, nontender, no hepatomegaly, no bruits  Ext: no edema, 2+ distal pulses Skin: warm and dry  GU: deferred Neuro: no focal abnormalities noted, AAO x 3  EKG:  06/09/14-sinus rhythm, 71, no other ST segment changes.  ASSESSMENT AND PLAN:  1. Carotid artery disease-bilateral 50% stenosis. We will continue to monitor. Dopplers performed in December 2016 approximate 50% bilateral carotid artery stenosis. Asymptomatic. I offered her vascular referral previously but she would like to wait at this point. 2. Coronary artery disease-stable. EKG previously unremarkable. Low-dose isosorbide. She has been off of beta blocker for quite some time secondary to fatigue. She is doing well today. Blood pressure too low to advance any other antianginal medications. Continue with adequate hydration. 3. Angina-well-controlled. She does have atypical sharp stabbing chest pains at times. Noncardiac. Isosorbide. I wonder if this episode in her lower mid epigastric sternal region was also muscular. EKG reassuring. 4. Obesity-doing well with weight loss.  She is trying to avoid medication for diabetes. Her prior hemoglobin A1c was 6.9. She is striving for diet control.  5. Hyperlipidemia-currently on Crestor. Doing well. No myalgias. LDL less than 70. Goal. 6. Orthostatic hypotension - reviewed [name], NP note. ? From pain meds. Agree with pushing fluids.   Will see in follow up 12  months  Signed, Candee Furbish,  MD [facility]  05/21/2016 9:33 AM

## 2016-05-21 NOTE — Assessment & Plan Note (Signed)
Lipid Profile and CMET today Encouraged her to consume a low fat diet Continue Crestor and Fish Oil for now

## 2016-05-21 NOTE — Patient Instructions (Signed)

## 2016-05-21 NOTE — Assessment & Plan Note (Signed)
She follows with Dr. Wynona Canes She will continue Meloxicam, Fentanyl, Oxycodone and Zanaflex

## 2016-05-21 NOTE — Assessment & Plan Note (Signed)
No angina On ASA, Crestor and Isosorbide She follows with Dr. Luther Parody

## 2016-05-21 NOTE — Assessment & Plan Note (Signed)
Diet controlled A1C and microalbumin today Encouraged her to consume a low fat, low carb diet Encouraged yearly eye exams Flu shot today Pneumovax UTD Foot exam today

## 2016-05-21 NOTE — Assessment & Plan Note (Signed)
Chronic but stable on Paxil and Wellbutrin Will monitor

## 2016-05-23 DIAGNOSIS — M1711 Unilateral primary osteoarthritis, right knee: Secondary | ICD-10-CM | POA: Diagnosis not present

## 2016-06-08 ENCOUNTER — Ambulatory Visit: Payer: Medicare Other | Attending: Pain Medicine | Admitting: Pain Medicine

## 2016-06-08 ENCOUNTER — Encounter: Payer: Self-pay | Admitting: Pain Medicine

## 2016-06-08 VITALS — BP 100/55 | HR 84 | Temp 98.2°F | Resp 16 | Ht 69.0 in | Wt 230.0 lb

## 2016-06-08 DIAGNOSIS — Z8249 Family history of ischemic heart disease and other diseases of the circulatory system: Secondary | ICD-10-CM | POA: Insufficient documentation

## 2016-06-08 DIAGNOSIS — Z8619 Personal history of other infectious and parasitic diseases: Secondary | ICD-10-CM | POA: Diagnosis not present

## 2016-06-08 DIAGNOSIS — M797 Fibromyalgia: Secondary | ICD-10-CM | POA: Insufficient documentation

## 2016-06-08 DIAGNOSIS — Z981 Arthrodesis status: Secondary | ICD-10-CM | POA: Diagnosis not present

## 2016-06-08 DIAGNOSIS — E7800 Pure hypercholesterolemia, unspecified: Secondary | ICD-10-CM | POA: Diagnosis not present

## 2016-06-08 DIAGNOSIS — G629 Polyneuropathy, unspecified: Secondary | ICD-10-CM | POA: Insufficient documentation

## 2016-06-08 DIAGNOSIS — M170 Bilateral primary osteoarthritis of knee: Secondary | ICD-10-CM | POA: Diagnosis not present

## 2016-06-08 DIAGNOSIS — M47812 Spondylosis without myelopathy or radiculopathy, cervical region: Secondary | ICD-10-CM | POA: Diagnosis not present

## 2016-06-08 DIAGNOSIS — E669 Obesity, unspecified: Secondary | ICD-10-CM | POA: Diagnosis not present

## 2016-06-08 DIAGNOSIS — Z833 Family history of diabetes mellitus: Secondary | ICD-10-CM | POA: Insufficient documentation

## 2016-06-08 DIAGNOSIS — F329 Major depressive disorder, single episode, unspecified: Secondary | ICD-10-CM | POA: Insufficient documentation

## 2016-06-08 DIAGNOSIS — D649 Anemia, unspecified: Secondary | ICD-10-CM | POA: Insufficient documentation

## 2016-06-08 DIAGNOSIS — M791 Myalgia: Secondary | ICD-10-CM | POA: Diagnosis not present

## 2016-06-08 DIAGNOSIS — F1190 Opioid use, unspecified, uncomplicated: Secondary | ICD-10-CM | POA: Diagnosis not present

## 2016-06-08 DIAGNOSIS — Z6833 Body mass index (BMI) 33.0-33.9, adult: Secondary | ICD-10-CM | POA: Insufficient documentation

## 2016-06-08 DIAGNOSIS — I2510 Atherosclerotic heart disease of native coronary artery without angina pectoris: Secondary | ICD-10-CM | POA: Insufficient documentation

## 2016-06-08 DIAGNOSIS — M4800 Spinal stenosis, site unspecified: Secondary | ICD-10-CM | POA: Insufficient documentation

## 2016-06-08 DIAGNOSIS — F17210 Nicotine dependence, cigarettes, uncomplicated: Secondary | ICD-10-CM | POA: Diagnosis not present

## 2016-06-08 DIAGNOSIS — G8929 Other chronic pain: Secondary | ICD-10-CM

## 2016-06-08 DIAGNOSIS — M25561 Pain in right knee: Secondary | ICD-10-CM | POA: Diagnosis not present

## 2016-06-08 DIAGNOSIS — E119 Type 2 diabetes mellitus without complications: Secondary | ICD-10-CM | POA: Insufficient documentation

## 2016-06-08 DIAGNOSIS — M419 Scoliosis, unspecified: Secondary | ICD-10-CM | POA: Diagnosis not present

## 2016-06-08 DIAGNOSIS — M5136 Other intervertebral disc degeneration, lumbar region: Secondary | ICD-10-CM | POA: Diagnosis not present

## 2016-06-08 DIAGNOSIS — Z9049 Acquired absence of other specified parts of digestive tract: Secondary | ICD-10-CM | POA: Diagnosis not present

## 2016-06-08 DIAGNOSIS — G894 Chronic pain syndrome: Secondary | ICD-10-CM | POA: Insufficient documentation

## 2016-06-08 DIAGNOSIS — Z79891 Long term (current) use of opiate analgesic: Secondary | ICD-10-CM | POA: Diagnosis not present

## 2016-06-08 DIAGNOSIS — Z8614 Personal history of Methicillin resistant Staphylococcus aureus infection: Secondary | ICD-10-CM | POA: Diagnosis not present

## 2016-06-08 DIAGNOSIS — M542 Cervicalgia: Secondary | ICD-10-CM | POA: Insufficient documentation

## 2016-06-08 DIAGNOSIS — Z809 Family history of malignant neoplasm, unspecified: Secondary | ICD-10-CM | POA: Insufficient documentation

## 2016-06-08 DIAGNOSIS — Z7982 Long term (current) use of aspirin: Secondary | ICD-10-CM | POA: Insufficient documentation

## 2016-06-08 DIAGNOSIS — M7918 Myalgia, other site: Secondary | ICD-10-CM

## 2016-06-08 MED ORDER — PREGABALIN 100 MG PO CAPS: 100.0000 mg | ORAL_CAPSULE | Freq: Three times a day (TID) | ORAL | 0 refills | Status: DC

## 2016-06-08 MED ORDER — TIZANIDINE HCL 4 MG PO CAPS: 4.0000 mg | ORAL_CAPSULE | Freq: Three times a day (TID) | ORAL | 0 refills | Status: DC

## 2016-06-08 MED ORDER — OXYCODONE HCL 5 MG PO TABS: 5.0000 mg | ORAL_TABLET | Freq: Three times a day (TID) | ORAL | 0 refills | Status: DC

## 2016-06-08 NOTE — Progress Notes (Signed)
Patient's Name: Melinda [name]  MRN: [id number]  Referring Provider: Jearld Fenton, NP  DOB: 07-25-1955  PCP: Jearld Fenton, NP  DOS: 06/08/2016  Note by: Kathlen Brunswick. Dossie Arbour, MD  Service setting: Ambulatory outpatient  Specialty: Interventional Pain Management  Location: [facility] (AMB) Pain Management Facility    Patient type: Established   Primary Reason(s) for Visit: Encounter for prescription drug management (Level of risk: moderate) CC: Knee Pain (right); Oral Pain (right upper gum line and radiating pain on the bottom); Neck Pain (on the right side; ); and Back Pain (lower  (x-ray in the system done in Nov) [place] [facility])  HPI  Melinda [name] is a 60 y.o. year old, female patient, who comes today for a medication management evaluation. She has Chronic pain syndrome; CAD (coronary artery disease); Obesity (BMI 30-39.9); Pure hypercholesterolemia; DM2 (diabetes mellitus, type 2) ([place]); Great toe amputation status ([place]); Failed back surgical syndrome; Failed cervical surgery syndrome (ACDF); Chronic neck pain; Chronic low back pain (Bilateral) (L>R); Epidural fibrosis; Cervical spondylosis; Neuropathic pain; Fibromyalgia; Presence of functional implant (Medtronic programmable intrathecal pump) (Right abdominal area); Presence of intrathecal pump (Medtronic intrathecal programmable pump) (40 mL pump); Opioid-induced constipation (OIC); Chronic knee pain (Location of Primary Source of Pain) (Right); Osteoarthritis of knee (Left); Depression; Long term current use of opiate analgesic; Long term prescription opiate use; Opiate use; Encounter for adjustment or management of infusion pump; Osteoarthritis, multiple sites; Musculoskeletal pain; and Osteoarthritis of knee (Bilateral) (R>L) on her problem list. Her primarily concern today is the Knee Pain (right); Oral Pain (right upper gum line and radiating pain on the bottom); Neck Pain (on the right side; ); and Back Pain (lower  (x-ray in the system  done in Nov) [place] [facility])  Pain Assessment: Self-Reported Pain Score: 4 /10             Reported level is compatible with observation.       Pain Type: Chronic pain Pain Location: Knee (neck pain is 4/10 mouth pain 3/10) Pain Orientation: Right Pain Descriptors / Indicators: Aching, Constant, Shooting (shooting pain when stepping and turning feet) Pain Frequency: Constant  Melinda [name] was last seen on 04/19/2016 for medication management. During today's appointment we reviewed Melinda [name]'s chronic pain status, as well as her outpatient medication regimen.  The patient  reports that she does not use drugs. Her body mass index is 33.97 kg/m.  Further details on both, my assessment(s), as well as the proposed treatment plan, please see below.  Controlled Substance Pharmacotherapy Assessment REMS (Risk Evaluation and Mitigation Strategy)  Analgesic:Oxycodone IR 5 mg every 8 hours (15 mg/day) MME/day:22 mg/day of oral medication. Evon Slack, RN  06/08/2016 11:16 AM  Sign at close encounter Nursing Pain Medication Assessment:  Safety precautions to be maintained throughout the outpatient stay will include: orient to surroundings, keep bed in low position, maintain call bell within reach at all times, provide assistance with transfer out of bed and ambulation.  Medication Inspection Compliance: Pill count conducted under aseptic conditions, in front of the patient. Neither the pills nor the bottle was removed from the patient's sight at any time. Once count was completed pills were immediately returned to the patient in their original bottle.  Medication: Oxycodone IR Pill Count: 80 of 90 pills remain Bottle Appearance: Standard pharmacy container. Clearly labeled. Filled Date: 62 / 28 / 2017 Medication last intake: 06/08/2016   Pharmacokinetics: Liberation and absorption (onset of action): WNL Distribution (time to peak  effect): WNL Metabolism and excretion (duration of  action): WNL         Pharmacodynamics: Desired effects: Analgesia: Melinda [name] reports >50% benefit. Functional ability: Patient reports that medication allows her to accomplish basic ADLs Clinically meaningful improvement in function (CMIF): Sustained CMIF goals met Perceived effectiveness: Described as relatively effective, allowing for increase in activities of daily living (ADL) Undesirable effects: Side-effects or Adverse reactions: None reported Monitoring: [place] PMP: Online review of the past 76-monthperiod conducted. Compliant with practice rules and regulations List of all UDS test(s) done:  Lab Results  Component Value Date   TOXASSSELUR FINAL 10/18/2015   TOXASSSELUR FINAL 07/27/2015   Last UDS on record: ToxAssure Select 13  Date Value Ref Range Status  10/18/2015 FINAL  Final    Comment:    ==================================================================== TOXASSURE SELECT 13 (MW) ==================================================================== Test                             Result       Flag       Units Drug Present and Declared for Prescription Verification   Oxycodone                      753          EXPECTED   ng/mg creat   Oxymorphone                    98           EXPECTED   ng/mg creat   Noroxycodone                   3344         EXPECTED   ng/mg creat    Sources of oxycodone include scheduled prescription medications.    Oxymorphone and noroxycodone are expected metabolites of    oxycodone. Oxymorphone is also available as a scheduled    prescription medication. Drug Present not Declared for Prescription Verification   Fentanyl                       14           UNEXPECTED ng/mg creat   Norfentanyl                    47           UNEXPECTED ng/mg creat    Source of fentanyl is a scheduled prescription medication,    including IV, patch, and transmucosal formulations. Norfentanyl    is an expected metabolite of  fentanyl. ==================================================================== Test                      Result    Flag   Units      Ref Range   Creatinine              246              mg/dL      >=20 ==================================================================== Declared Medications:  The flagging and interpretation on this report are based on the  following declared medications.  Unexpected results may arise from  inaccuracies in the declared medications.  **Note: The testing scope of this panel includes these medications:  Oxycodone  **Note: The testing scope of this panel does not include following  reported medications:  Aspirin  Bupropion (Wellbutrin)  Ibuprofen  Isosorbide  Nitroglycerin  Paroxetine (Paxil)  Polyethylene Glycol  Pregabalin (Lyrica)  Rosuvastatin (Crestor)  Sulfamethoxazole (Trimethoprim-Sulfamethoxazole)  Tizanidine  Trimethoprim (Trimethoprim-Sulfamethoxazole) ==================================================================== For clinical consultation, please call [phone]. ====================================================================    UDS interpretation: Compliant          Medication Assessment Form: Reviewed. Patient indicates being compliant with therapy Treatment compliance: Compliant Risk Assessment Profile: Aberrant behavior: See prior evaluations. None observed or detected today Comorbid factors increasing risk of overdose: See prior notes. No additional risks detected today Risk of substance use disorder (SUD): Low Opioid Risk Tool (ORT) Total Score: 3  Interpretation Table:  Score <3 = Low Risk for SUD  Score between 4-7 = Moderate Risk for SUD  Score >8 = High Risk for Opioid Abuse   Risk Mitigation Strategies:  Patient Counseling: Covered Patient-Prescriber Agreement (PPA): Present and active  Notification to other healthcare providers: Done  Pharmacologic Plan: No change in therapy, at this  time  Laboratory Chemistry  Inflammation Markers Lab Results  Component Value Date   ESRSEDRATE 16 09/18/2010   CRP 0.2 09/18/2010   Renal Function Lab Results  Component Value Date   BUN 28 (H) 05/18/2016   CREATININE 1.09 05/18/2016   GFRAA >60 09/10/2015   GFRNONAA >60 09/10/2015   Hepatic Function Lab Results  Component Value Date   AST 20 05/18/2016   ALT 18 05/18/2016   ALBUMIN 4.8 05/18/2016   Electrolytes Lab Results  Component Value Date   NA 141 05/18/2016   K 4.4 05/18/2016   CL 104 05/18/2016   CALCIUM 9.6 05/18/2016   MG 2.1 06/30/2013   Pain Modulating Vitamins Lab Results  Component Value Date   VITAMINB12 319 10/04/2013   Coagulation Parameters Lab Results  Component Value Date   INR 1.12 09/11/2015   LABPROT 14.6 09/11/2015   APTT 31 09/11/2015   PLT 254.0 05/18/2016   Cardiovascular Lab Results  Component Value Date   HGB 13.2 05/18/2016   HCT 38.9 05/18/2016   Note: Lab results reviewed.  Recent Diagnostic Imaging Review  Dg Lumbar Spine Complete  Result Date: 05/18/2016 CLINICAL DATA:  Right side sciatica. EXAM: LUMBAR SPINE - COMPLETE 4+ VIEW COMPARISON:  None. FINDINGS: Mild rightward scoliosis. Diffuse degenerative disc and facet disease in the lumbar spine. No fracture or subluxation. SI joints are symmetric and unremarkable. Spinal stimulator device partially imaged. The tip of the wire is not visualized. IMPRESSION: Degenerative disc and facet disease. Rightward scoliosis. No acute findings. Electronically Signed   By: Rolm Baptise M.D.   On: 05/18/2016 17:12   Note: Imaging results reviewed.          Meds  The patient has a current medication list which includes the following prescription(s): accu-chek aviva plus, accu-chek softclix lancets, AMBULATORY NON FORMULARY MEDICATION, aspirin, bupropion, vitamin d3, chromium picolinate, co q 10, ginger (zingiber officinalis), glucosamine-chondroitin, glucose blood, isosorbide  dinitrate, magnesium malate, meloxicam, multivitamin, nitroglycerin, nutritional supplements, fish oil, oxycodone, paroxetine, polyethylene glycol, pregabalin, rosuvastatin, tizanidine, vitamin e, and PAIN MANAGEMENT IT PUMP REFILL.  Current Outpatient Prescriptions on File Prior to Visit  Medication Sig  . ACCU-CHEK AVIVA PLUS test strip TEST 2 TIMES DAY AS NEEDED **E11.9**  . ACCU-CHEK SOFTCLIX LANCETS lancets 1 each by Other route 2 (two) times daily. Use as instructed  . AMBULATORY NON FORMULARY MEDICATION Medication Name: CPAP MASK OF CHOICE FOR HOME DEVICE  . aspirin 81 MG tablet Take 81 mg by mouth daily.  Marland Kitchen buPROPion (WELLBUTRIN XL) 150 MG 24 hr tablet TAKE 1  TABLET BY MOUTH DAILY  . Cholecalciferol (VITAMIN D3) 2000 units TABS Take 2,000 Units by mouth daily.  . Chromium Picolinate 800 MCG TABS Take 800 mcg by mouth daily.  . Coenzyme Q10 (CO Q 10) 100 MG CAPS Take 200 Doses by mouth.  . Ginger, Zingiber officinalis, (GINGER ROOT PO) Take 1.1 g by mouth daily.  Marland Kitchen glucosamine-chondroitin 500-400 MG tablet Take 1,500 tablets by mouth 3 (three) times daily.  Marland Kitchen glucose blood test strip 1 each by Other route. Use as instructed  . isosorbide dinitrate (ISORDIL) 30 MG tablet Take 1 tablet (30 mg total) by mouth daily.  Marland Kitchen MAGNESIUM MALATE PO Take 115 mg by mouth daily.  . meloxicam (MOBIC) 15 MG tablet Take 1 tablet (15 mg total) by mouth daily.  . Multiple Vitamin (MULTIVITAMIN) tablet Take 1 tablet by mouth daily.  . nitroGLYCERIN (NITROSTAT) 0.4 MG SL tablet Place 1 tablet (0.4 mg total) under the tongue every 5 (five) minutes as needed for chest pain.  . Nutritional Supplements (GRAPESEED EXTRACT PO) Take 400 mg by mouth daily.  . Omega-3 Fatty Acids (FISH OIL) 1200 MG CAPS Take by mouth.  Marland Kitchen PARoxetine (PAXIL) 20 MG tablet TAKE ONE-HALF (1/2) OF A TABLET BY MOUTHTWICE DAILY  . polyethylene glycol (MIRALAX / GLYCOLAX) packet Take 17 g by mouth daily.  . rosuvastatin (CRESTOR) 20 MG tablet  Take 1 tablet (20 mg total) by mouth daily.  . vitamin E 400 UNIT capsule Take 400 Units by mouth daily.  Marland Kitchen PAIN MANAGEMENT IT PUMP REFILL 1 each by Intrathecal route once. Medication: PF Fentanyl 500.0 mcg/ml PF Baclofen 300.6mg/ml PF Bupivicaine 20.0 mg/ml Total Volume: 40 ml Needed by 04-19-16 '@1000'$    No current facility-administered medications on file prior to visit.    ROS  Constitutional: Denies any fever or chills Gastrointestinal: No reported hemesis, hematochezia, vomiting, or acute GI distress Musculoskeletal: Denies any acute onset joint swelling, redness, loss of ROM, or weakness Neurological: No reported episodes of acute onset apraxia, aphasia, dysarthria, agnosia, amnesia, paralysis, loss of coordination, or loss of consciousness  Allergies  Ms. CInnocenthas No Known Allergies.  [place] Drug: Ms. CMaultsby reports that she does not use drugs. Alcohol:  reports that she drinks alcohol. Tobacco:  reports that she quit smoking about 8 years ago. Her smoking use included Cigarettes. She has a 48.00 pack-year smoking history. She has never used smokeless tobacco. Medical:  has a past medical history of Anemia; Bulging disc; CAD (coronary artery disease) (2009); Cardiac arrhythmia due to congenital heart disease; Chicken pox; Coronary arteritis; Degenerative disc disease; Diabetes mellitus without complication ([place]; Facet joint disease; Fibromyalgia; Heart disease; Hyperlipidemia; MCL deficiency, knee; MRSA (methicillin resistant staph aureus) culture positive (2011); [place]uropathy ([place] (04/18/2010); Peripheral neuropathy ([place]; Sleep apnea; and Spinal stenosis. Family: family history includes Cancer in her mother and paternal grandfather; Diabetes in her father and paternal grandmother; Heart disease in her maternal grandfather.  Past Surgical History:  Procedure Laterality Date  . ABLATION     UTERUS  . ABLATION     HEART  . ANTERIOR CERVICAL DECOMP/DISCECTOMY FUSION N/A  07/28/2014   Procedure: ANTERIOR CERVICAL DECOMPRESSION/DISCECTOMY FUSION CERVICAL 3-4,4-5,5-6 LEVELS WITH INSTRUMENTATION AND ALLOGRAFT;  Surgeon: MSinclair Ship MD;  [facility]  Service: Orthopedics;  Laterality: N/A;  Anterior cervical decompression fusion, cervical 3-4, cervical 4-5, cervical 5-6 with instrumentation and allograft  . BACK SURGERY     X 3 1979, 1994, 1995  . CHOLECYSTECTOMY  2003  .  EYE SURGERY Bilateral   . FOOT SURGERY Right    BIG TOE  . FOOT SURGERY Bilateral    PLANTAR FASCIITIS  . FOOT SURGERY Right    2ND TOE  . FRACTURE SURGERY Right 2012   carpal tunnel  . GALLBLADDER SURGERY    . HAND SURGERY Right   . HAND SURGEY Left   . HEART STENT  2009   LAD  . INFUSION PUMP IMPLANTATION     X2 with morphine and baclofen   Constitutional Exam  General appearance: Well nourished, well developed, and well hydrated. In no apparent acute distress Vitals:   06/08/16 1101  BP: (!) 100/55  Pulse: 84  Resp: 16  Temp: 98.2 F (36.8 C)  TempSrc: Oral  SpO2: 98%  Weight: 230 lb (104.3 kg)  Height: '5\' 9"'$  (1.753 m)   BMI Assessment: Estimated body mass index is 33.97 kg/m as calculated from the following:   Height as of this encounter: '5\' 9"'$  (1.753 m).   Weight as of this encounter: 230 lb (104.3 kg).  BMI interpretation table: BMI level Category Range association with higher incidence of chronic pain  <18 kg/m2 Underweight   18.5-24.9 kg/m2 Ideal body weight   25-29.9 kg/m2 Overweight Increased incidence by 20%  30-34.9 kg/m2 Obese (Class I) Increased incidence by 68%  35-39.9 kg/m2 Severe obesity (Class II) Increased incidence by 136%  >40 kg/m2 Extreme obesity (Class III) Increased incidence by 254%   BMI Readings from Last 4 Encounters:  06/08/16 33.97 kg/m  05/21/16 33.02 kg/m  05/18/16 33.52 kg/m  04/19/16 32.49 kg/m   Wt Readings from Last 4 Encounters:  06/08/16 230 lb (104.3 kg)  05/21/16 230 lb 1.9 oz (104.4 kg)  05/18/16 227  lb (103 kg)  04/19/16 220 lb (99.8 kg)  Psych/Mental status: Alert, oriented x 3 (person, place, & time) Eyes: PERLA Respiratory: No evidence of acute respiratory distress  Cervical Spine Exam  Inspection: No masses, redness, or swelling Alignment: Symmetrical Functional ROM: Unrestricted ROM Stability: No instability detected Muscle strength & Tone: Functionally intact Sensory: Unimpaired Palpation: Non-contributory  Upper Extremity (UE) Exam    Side: Right upper extremity  Side: Left upper extremity  Inspection: No masses, redness, swelling, or asymmetry  Inspection: No masses, redness, swelling, or asymmetry  Functional ROM: Unrestricted ROM          Functional ROM: Unrestricted ROM          Muscle strength & Tone: Functionally intact  Muscle strength & Tone: Functionally intact  Sensory: Unimpaired  Sensory: Unimpaired  Palpation: Non-contributory  Palpation: Non-contributory   Thoracic Spine Exam  Inspection: No masses, redness, or swelling Alignment: Symmetrical Functional ROM: Unrestricted ROM Stability: No instability detected Sensory: Unimpaired Muscle strength & Tone: Functionally intact Palpation: Non-contributory  Lumbar Spine Exam  Inspection: No masses, redness, or swelling Alignment: Symmetrical Functional ROM: Unrestricted ROM Stability: No instability detected Muscle strength & Tone: Functionally intact Sensory: Unimpaired Palpation: Non-contributory Provocative Tests: Lumbar Hyperextension and rotation test: evaluation deferred today       Patrick's Maneuver: evaluation deferred today              Gait & Posture Assessment  Ambulation: Limited Gait: Antalgic Posture: Antalgic   Lower Extremity Exam    Side: Right lower extremity  Side: Left lower extremity  Inspection: No masses, redness, swelling, or asymmetry  Inspection: No masses, redness, swelling, or asymmetry  Functional ROM: Decreased ROM for knee joint  Functional ROM: Unrestricted ROM  Muscle strength & Tone: Functionally intact  Muscle strength & Tone: Functionally intact  Sensory: Movement-associated pain  Sensory: Unimpaired  Palpation: Non-contributory  Palpation: Non-contributory   Assessment  Primary Diagnosis & Pertinent Problem List: The primary encounter diagnosis was Chronic pain syndrome. Diagnoses of Long term current use of opiate analgesic, Long term prescription opiate use, Opiate use, Fibromyalgia, Musculoskeletal pain, Chronic neck pain, Chronic knee pain (Location of Primary Source of Pain) (Right), Osteoarthritis of knee (Bilateral) (R>L), and Cervical spondylosis were also pertinent to this visit.  Visit Diagnosis: 1. Chronic pain syndrome   2. Long term current use of opiate analgesic   3. Long term prescription opiate use   4. Opiate use   5. Fibromyalgia   6. Musculoskeletal pain   7. Chronic neck pain   8. Chronic knee pain (Location of Primary Source of Pain) (Right)   9. Osteoarthritis of knee (Bilateral) (R>L)   10. Cervical spondylosis    Plan of Care  Pharmacotherapy (Medications Ordered): Meds ordered this encounter  Medications  . oxyCODONE (OXY IR/ROXICODONE) 5 MG immediate release tablet    Sig: Take 1 tablet (5 mg total) by mouth 3 (three) times daily.    Dispense:  90 tablet    Refill:  0    Patient may have prescription filled one day early if pharmacy is closed on scheduled refill date. Do not fill until: 07/19/16 To last until: 08/18/16  . pregabalin (LYRICA) 100 MG capsule    Sig: Take 1 capsule (100 mg total) by mouth 3 (three) times daily.    Dispense:  90 capsule    Refill:  0    Do not place this medication, or any other prescription from our practice, on "Automatic Refill". Patient may have prescription filled one day early if pharmacy is closed on scheduled refill date.  Marland Kitchen tiZANidine (ZANAFLEX) 4 MG capsule    Sig: Take 1 capsule (4 mg total) by mouth 3 (three) times daily.    Dispense:  90 capsule    Refill:   0    Do not add this medication to the electronic "Automatic Refill" notification system. Patient may have prescription filled one day early if pharmacy is closed on scheduled refill date.   New Prescriptions   No medications on file   Medications administered today: Ms. Kersting had no medications administered during this visit. Lab-work, procedure(s), and/or referral(s): Orders Placed This Encounter  Procedures  . GENICULAR NERVE BLOCK  . Cervical Epidural Injection  . ToxASSURE Select 13 (MW), Urine   Imaging and/or referral(s): None  Interventional therapies: Planned, scheduled, and/or pending:   Intrathecal pump refill.    Considering:   Right knee genicular nerve blocks under fluoroscopic guidance, with or without sedation the pending of her choice.  Possible right knee genicular RFA  Palliative cervical epidural steroid injection under fluoroscopic guidance, with or without sedation.  Diagnostic cervical facet block under fluoroscopic guidance and IV sedation  Possible bilateral cervical facet radiofrequency ablation    Palliative PRN treatment(s):   Cervical epidural steroid injection  Right diagnostic genicular nerve block    Provider-requested follow-up: Return for previously scheduled appointment.  Future Appointments Date Time Provider [place]  08/08/2016 11:00 AM [name], MD [facility] None   Primary Care Physician: Jearld Fenton, NP Location: [facility] Note by: Kathlen Brunswick. Dossie Arbour, M.D, DABA, DABAPM, DABPM, DABIPP, FIPP Date: 06/08/16; Time: 1:26 PM  Pain Score Disclaimer: We use the NRS-11 scale. This is a self-reported,  subjective measurement of pain severity with only modest accuracy. It is used primarily to identify changes within a particular patient. It must be understood that outpatient pain scales are significantly less accurate that those used for research, where they can be applied under ideal  controlled circumstances with minimal exposure to variables. In reality, the score is likely to be a combination of pain intensity and pain affect, where pain affect describes the degree of emotional arousal or changes in action readiness caused by the sensory experience of pain. Factors such as social and work situation, setting, emotional state, anxiety levels, expectation, and prior pain experience may influence pain perception and show large inter-individual differences that may also be affected by time variables.  Patient instructions provided during this appointment: There are no Patient Instructions on file for this visit.

## 2016-06-08 NOTE — Progress Notes (Signed)
Nursing Pain Medication Assessment:  Safety precautions to be maintained throughout the outpatient stay will include: orient to surroundings, keep bed in low position, maintain call bell within reach at all times, provide assistance with transfer out of bed and ambulation.  Medication Inspection Compliance: Pill count conducted under aseptic conditions, in front of the patient. Neither the pills nor the bottle was removed from the patient's sight at any time. Once count was completed pills were immediately returned to the patient in their original bottle.  Medication: Oxycodone IR Pill Count: 80 of 90 pills remain Bottle Appearance: Standard pharmacy container. Clearly labeled. Filled Date: 97 / 28 / 2017 Medication last intake: 06/08/2016

## 2016-06-15 LAB — TOXASSURE SELECT 13 (MW), URINE

## 2016-06-22 ENCOUNTER — Other Ambulatory Visit: Payer: Self-pay | Admitting: Internal Medicine

## 2016-06-22 NOTE — Telephone Encounter (Signed)
Last filled 12/2015 #90--please advise if okay to refill

## 2016-07-03 ENCOUNTER — Encounter: Payer: Medicare Other | Admitting: Pain Medicine

## 2016-07-12 NOTE — Addendum Note (Signed)
Addended by: Lindalou Hose Y on: 07/12/2016 12:00 PM   Modules accepted: Orders

## 2016-07-30 ENCOUNTER — Other Ambulatory Visit: Payer: Self-pay

## 2016-07-30 MED ORDER — PAIN MANAGEMENT IT PUMP REFILL: 1.0000 | Freq: Once | INTRATHECAL | 0 refills | Status: DC

## 2016-08-08 ENCOUNTER — Ambulatory Visit: Payer: Medicare Other | Attending: Pain Medicine | Admitting: Pain Medicine

## 2016-08-08 ENCOUNTER — Encounter: Payer: Self-pay | Admitting: Pain Medicine

## 2016-08-08 VITALS — BP 109/60 | HR 78 | Temp 98.8°F | Resp 18 | Ht 70.0 in | Wt 226.0 lb

## 2016-08-08 DIAGNOSIS — M25561 Pain in right knee: Secondary | ICD-10-CM

## 2016-08-08 DIAGNOSIS — M797 Fibromyalgia: Secondary | ICD-10-CM | POA: Diagnosis not present

## 2016-08-08 DIAGNOSIS — Z451 Encounter for adjustment and management of infusion pump: Secondary | ICD-10-CM | POA: Diagnosis not present

## 2016-08-08 DIAGNOSIS — Z7982 Long term (current) use of aspirin: Secondary | ICD-10-CM | POA: Insufficient documentation

## 2016-08-08 DIAGNOSIS — Z9889 Other specified postprocedural states: Secondary | ICD-10-CM | POA: Insufficient documentation

## 2016-08-08 DIAGNOSIS — M961 Postlaminectomy syndrome, not elsewhere classified: Secondary | ICD-10-CM | POA: Insufficient documentation

## 2016-08-08 DIAGNOSIS — M791 Myalgia: Secondary | ICD-10-CM | POA: Diagnosis not present

## 2016-08-08 DIAGNOSIS — Z955 Presence of coronary angioplasty implant and graft: Secondary | ICD-10-CM | POA: Diagnosis not present

## 2016-08-08 DIAGNOSIS — Z9049 Acquired absence of other specified parts of digestive tract: Secondary | ICD-10-CM | POA: Insufficient documentation

## 2016-08-08 DIAGNOSIS — M7918 Myalgia, other site: Secondary | ICD-10-CM

## 2016-08-08 DIAGNOSIS — Z9689 Presence of other specified functional implants: Secondary | ICD-10-CM

## 2016-08-08 DIAGNOSIS — Z79891 Long term (current) use of opiate analgesic: Secondary | ICD-10-CM | POA: Diagnosis not present

## 2016-08-08 DIAGNOSIS — M159 Polyosteoarthritis, unspecified: Secondary | ICD-10-CM

## 2016-08-08 DIAGNOSIS — M150 Primary generalized (osteo)arthritis: Secondary | ICD-10-CM | POA: Diagnosis not present

## 2016-08-08 DIAGNOSIS — M8949 Other hypertrophic osteoarthropathy, multiple sites: Secondary | ICD-10-CM

## 2016-08-08 DIAGNOSIS — G894 Chronic pain syndrome: Secondary | ICD-10-CM | POA: Insufficient documentation

## 2016-08-08 DIAGNOSIS — Z969 Presence of functional implant, unspecified: Secondary | ICD-10-CM

## 2016-08-08 DIAGNOSIS — Z978 Presence of other specified devices: Secondary | ICD-10-CM

## 2016-08-08 DIAGNOSIS — G8929 Other chronic pain: Secondary | ICD-10-CM

## 2016-08-08 MED ORDER — PREGABALIN 100 MG PO CAPS: 100.0000 mg | ORAL_CAPSULE | Freq: Three times a day (TID) | ORAL | 0 refills | Status: DC

## 2016-08-08 MED ORDER — OXYCODONE HCL 5 MG PO TABS: 5.0000 mg | ORAL_TABLET | Freq: Three times a day (TID) | ORAL | 0 refills | Status: DC | PRN

## 2016-08-08 MED ORDER — TIZANIDINE HCL 4 MG PO CAPS: 4.0000 mg | ORAL_CAPSULE | Freq: Three times a day (TID) | ORAL | 0 refills | Status: DC

## 2016-08-08 MED ORDER — MELOXICAM 15 MG PO TABS: 15.0000 mg | ORAL_TABLET | Freq: Every day | ORAL | 0 refills | Status: DC

## 2016-08-08 NOTE — Patient Instructions (Addendum)
Today you had your IT pump refilled..  You got prescriptions for oxycodone x 3 and Lyrica.     You also have prescriptions for Mobic and Tizanidine at the pharmacy. Opioid Overdose Introduction Opioids are substances that relieve pain by binding to pain receptors in your brain and spinal cord. Opioids include illegal drugs, such as heroin, as well as prescription pain medicines.An opioid overdose happens when you take too much of an opioid substance. This can happen with any type of opioid, including:  Heroin.  Morphine.  Codeine.  Methadone.  Oxycodone.  Hydrocodone.  Fentanyl.  Hydromorphone.  Buprenorphine. The effects of an overdose can be mild, dangerous, or even deadly. Opioid overdose is a medical emergency. What are the causes? This condition may be caused by:  Taking too much of an opioid by accident.  Taking too much of an opioid on purpose.  An error made by a health care provider who prescribes a medicine.  An error made by the pharmacist who fills the prescription order.  Using more than one substance that contains opioids at the same time.  Mixing an opioid with a substance that affects your heart, breathing, or blood pressure. These include alcohol, tranquilizers, sleeping pills, illegal drugs, and some over-the-counter medicines. What increases the risk? This condition is more likely in:  Children. They may be attracted to colorful pills. Because of a child's small size, even a small amount of a drug can be dangerous.  Elderly people. They may be taking many different drugs. Elderly people may have difficulty reading labels or remembering when they last took their medicine.  People who take an opioid on a long-term basis.  People who use:  Illegal drugs.  Other substances, including alcohol, while using an opioid.  People who have:  A history of drug or alcohol abuse.  Certain mental health conditions.  People who take opioids that are not  prescribed for them. What are the signs or symptoms? Symptoms of this condition depend on the type of opioid and the amount that was taken. Common symptoms include:  Sleepiness or difficulty waking from sleep.  Confusion.  Slurred speech.  Slowed breathing and a slow pulse.  Nausea and vomiting.  Abnormally small pupils. Signs and symptoms that require emergency treatment include:  Cold, clammy, and pale skin.  Blue lips and fingernails.  Vomiting.  Gurgling sounds in the throat.  A pulse that is very slow or difficult to detect.  Breathing that is very slow, noisy, or difficult to detect.  Limp body.  Inability to respond to speech or be awakened from sleep (stupor). How is this diagnosed? This condition is diagnosed based on your symptoms. It is important to tell your health care provider:  All of the opioidsthat you took.  When you took the opioids.  Whether you were drinking alcohol or using other substances. Your health care provider will do a physical exam. This exam may include:  Checking and monitoring your heart rate and rhythm, your breathing rate and depth, your temperature, and your blood pressure (vital signs).  Checking for abnormally small pupils.  Measuring oxygen levels in your blood. You may also have blood tests or urine tests. How is this treated? Supporting your vital signs and your breathing is the first step in treating an opioid overdose. Treatment may also include:  Giving fluids and minerals (electrolytes) through an IV tube.  Inserting a breathing tube (endotracheal tube) in your airway to help you breathe.  Giving oxygen.  Passing  a tube through your nose and into your stomach (NG tube, or nasogastric tube) to wash out your stomach.  Giving medicines that:  Increase your blood pressure.  Absorb any opioid that is in your digestive system.  Reverse the effects of the opioid (naloxone).  Ongoing counseling and mental health  support if you intentionally overdosed or used an illegal drug. Follow these instructions at home:  Take over-the-counter and prescription medicines only as told by your health care provider. Always ask your health care provider about possible side effects and interactions of any new medicine that you start taking.  Keep a list of all of the medicines that you take, including over-the-counter medicines. Bring this list with you to all of your medical visits.  Drink enough fluid to keep your urine clear or pale yellow.  Keep all follow-up visits as told by your health care provider. This is important. How is this prevented?  Get help if you are struggling with:  Alcohol or drug use.  Depression or another mental health problem.  Keep the phone number of your local poison control center near your phone or on your cell phone.  Store all medicines in safety containers that are out of the reach of children.  Read the drug inserts that come with your medicines.  Do not drink alcohol when taking opioids.  Do not use illegal drugs.  Do not take opioid medicines that are not prescribed for you. Contact a health care provider if:  Your symptoms return.  You develop new symptoms or side effects when you are taking medicines. Get help right away if:  You think that you or someone else may have taken too much of an opioid. The hotline of the [facility] is 908-718-5809.  You or someone else is having symptoms of an opioid overdose.  You have serious thoughts about hurting yourself or others.  You have:  Chest pain.  Difficulty breathing.  A loss of consciousness. Opioid overdose is an emergency. Do not wait to see if the symptoms will go away. Get medical help right away. Call your local emergency services (911 in the [place] Do not drive yourself to the hospital.  This information is not intended to replace advice given to you by your health care provider.  Make sure you discuss any questions you have with your health care provider. Document Released: 07/26/2004 Document Revised: 11/24/2015 Document Reviewed: 07/01/2013  2017 [employer]

## 2016-08-08 NOTE — Progress Notes (Signed)
Safety precautions to be maintained throughout the outpatient stay will include: orient to surroundings, keep bed in low position, maintain call bell within reach at all times, provide assistance with transfer out of bed and ambulation.  

## 2016-08-08 NOTE — Progress Notes (Signed)
Nursing Pain Medication Assessment:  Safety precautions to be maintained throughout the outpatient stay will include: orient to surroundings, keep bed in low position, maintain call bell within reach at all times, provide assistance with transfer out of bed and ambulation.  Medication Inspection Compliance: Pill count conducted under aseptic conditions, in front of the patient. Neither the pills nor the bottle was removed from the patient's sight at any time. Once count was completed pills were immediately returned to the patient in their original bottle. Pill Count: 80 of 90 pills remain Bottle Appearance: Standard pharmacy container. Clearly labeled. Medication: Oxycodone IR Filled Date: 01 / 26 / 2018

## 2016-08-08 NOTE — Progress Notes (Signed)
Patient's Name: Melinda [name]  MRN: [id number]  Referring Provider: Jearld Fenton, NP  DOB: 12-02-1955  PCP: Melinda Fenton, NP  DOS: 08/08/2016  Note by: Melinda [name]. Melinda Arbour, MD  Service setting: Ambulatory outpatient  Location: [facility] (AMB) Pain Management Facility  Visit type: Procedure  Specialty: Interventional Pain Management  Patient type: Established   Primary Reason for Visit: Interventional Pain Management Treatment. CC: Knee Pain (right)  Procedure:  Intrathecal Drug Delivery System (IDDS):  Type: Reservoir Refill [phone]) No rate change Region: Abdominal Laterality: Right  Type of Pump: Medtronic Synchromed II (MRI-compatible) Delivery Route: Intrathecal Type of Pain Treated: Neuropathic/Nociceptive Primary Medication Class: Opioid/opiate  Medication, Concentration, Infusion Program, & Delivery Rate: Please see scanned programming printout.  Indications: 1. Chronic pain syndrome   2. Fibromyalgia   3. Musculoskeletal pain   4. Primary osteoarthritis involving multiple joints   5. Failed back surgical syndrome   6. Failed cervical surgery syndrome (ACDF)   7. Chronic knee pain (Location of Primary Source of Pain) (Right)   8. Encounter for adjustment or management of infusion pump   9. Long term current use of opiate analgesic   10. Presence of functional implant (Medtronic programmable intrathecal pump) (Right abdominal area)   11. Presence of intrathecal pump (Medtronic intrathecal programmable pump) (40 mL pump)    Pain Assessment: Self-Reported Pain Score: 2 /10             Reported level is compatible with observation.        Intrathecal Pump Therapy Assessment  Manufacturer: Medtronic Synchromed II Type: Programmable Volume: 40 mL reservoir MRI compatibility: Yes   Drug content:  Primary Medication Class: Opioid Primary Medication: PF-Fentanyl Secondary Medication: PF-Bupivacaine Other Medication: Preservative-free clonidine   Programming:  Type: Simple  continuous. See pump readout for details.   Changes:  Medication Change: None at this point Rate Change: No change in rate  Reported side-effects or adverse reactions: None reported  Effectiveness: Described as relatively effective, allowing for increase in activities of daily living (ADL) Clinically meaningful improvement in function (CMIF): Sustained CMIF goals met  Plan: Pump refill today  Pre-op Assessment:  Previous date of service: 06/08/16 Service provided: Med Refill Melinda [name] is a 61 y.o. (year old), female patient, seen today for interventional treatment. She  has a past surgical history that includes Foot surgery (Right); Hand surgery (Right); Gallbladder surgery; Ablation; Ablation; HEART STENT (2009); HAND SURGEY (Left); Foot surgery (Bilateral); Foot surgery (Right); Infusion pump implantation; Eye surgery (Bilateral); Back surgery; Fracture surgery (Right, 2012); Cholecystectomy (2003); and Anterior cervical decomp/discectomy fusion (N/A, 07/28/2014). Her primarily concern today is the Knee Pain (right)  Initial Vital Signs: Blood pressure 109/60, pulse 78, temperature 98.8 F (37.1 C), resp. rate 18, height '5\' 10"'$  (1.778 m), weight 226 lb (102.5 kg), SpO2 93 %. BMI: 32.43 kg/m  Risk Assessment: Allergies: Reviewed. She has No Known Allergies.  Allergy Precautions: None required Coagulopathies: "Reviewed. None identified.  Blood-thinner therapy: None at this time Active Infection(s): Reviewed. None identified. Melinda [name] is afebrile  Site Confirmation: Melinda [name] was asked to confirm the procedure and laterality before marking the site Procedure checklist: Completed Consent: Before the procedure and under the influence of no sedative(s), amnesic(s), or anxiolytics, the patient was informed of the treatment options, risks and possible complications. To fulfill our ethical and legal obligations, as recommended by the American Medical Association's Code of Ethics, I have  informed the patient of my clinical impression; the nature and purpose of  the treatment or procedure; the risks, benefits, and possible complications of the intervention; the alternatives, including doing nothing; the risk(s) and benefit(s) of the alternative treatment(s) or procedure(s); and the risk(s) and benefit(s) of doing nothing.  Melinda [name] was provided with information about the general risks and possible complications associated with most interventional procedures. These include, but are not limited to: failure to achieve desired goals, infection, bleeding, organ or nerve damage, allergic reactions, paralysis, and/or death.  In addition, she was informed of those risks and possible complications associated to this particular procedure, which include, but are not limited to: damage to the implant; failure to decrease pain; local, systemic, or serious CNS infections, intraspinal abscess with possible cord compression and paralysis, or life-threatening such as meningitis; bleeding; organ damage; nerve injury or damage with subsequent sensory, motor, and/or autonomic system dysfunction, resulting in transient or permanent pain, numbness, and/or weakness of one or several areas of the body; allergic reactions, either minor or major life-threatening, such as anaphylactic or anaphylactoid reactions.  Furthermore, Melinda [name] was informed of those risks and complications associated with the medications. These include, but are not limited to: allergic reactions (i.e.: anaphylactic or anaphylactoid reactions); endorphine suppression; bradycardia and/or hypotension; water retention and/or peripheral vascular relaxation leading to lower extremity edema and possible stasis ulcers; respiratory depression and/or shortness of breath; decreased metabolic rate leading to weight gain; swelling or edema; medication-induced neural toxicity; particulate matter embolism and blood vessel occlusion with resultant organ,  and/or nervous system infarction; and/or intrathecal granuloma formation with possible spinal cord compression and permanent paralysis.  Before refilling the pump Melinda [name] was informed that some of the medications used in the devise may not be FDA approved for such use and therefore it constitutes an off-label use of the medications.  Finally, she was informed that Medicine is not an exact science; therefore, there is also the possibility of unforeseen or unpredictable risks and/or possible complications that may result in a catastrophic outcome. The patient indicated having understood very clearly. We have given the patient no guarantees and we have made no promises. Enough time was given to the patient to ask questions, all of which were answered to the patient's satisfaction. Ms. Rayner has indicated that she wanted to continue with the procedure. Attestation: I, the ordering provider, attest that I have discussed with the patient the benefits, risks, side-effects, alternatives, likelihood of achieving goals, and potential problems during recovery for the procedure that I have provided informed consent. Date: 08/08/2016; Time: 11:48 AM  Pre-Procedure Preparation:  Monitoring: As per clinic protocol. Respiration, ETCO2, SpO2, BP, heart rate and rhythm monitor placed and checked for adequate function Safety Precautions: Patient was assessed for positional comfort and pressure points before starting the procedure. Time-out: I initiated and conducted the "Time-out" before starting the procedure, as per protocol. The patient was asked to participate by confirming the accuracy of the "Time Out" information. Verification of the correct person, site, and procedure were performed and confirmed by me, the nursing staff, and the patient. "Time-out" conducted as per Joint Commission's Universal Protocol (UP.01.01.01). "Time-out" Date & Time: 08/08/2016; 1152 hrs.  Description of Procedure Process:   Position:  Supine Target Area: Central-port of intrathecal pump. Approach: Anterior, 90 degree angle approach. Area Prepped: Entire Area around the pump implant. Prepping solution: ChloraPrep (2% chlorhexidine gluconate and 70% isopropyl alcohol) Safety Precautions: Aspiration looking for blood return was conducted prior to all injections. At no point did we inject any substances, as a needle was being  advanced. No attempts were made at seeking any paresthesias. Safe injection practices and needle disposal techniques used. Medications properly checked for expiration dates. SDV (single dose vial) medications used. Description of the Procedure: Protocol guidelines were followed. Two nurses trained to do implant refills were present during the entire procedure. The refill medication was checked by both healthcare providers as well as the patient. The patient was included in the "Time-out" to verify the medication. The patient was placed in position. The pump was identified. The area was prepped in the usual manner. The sterile template was positioned over the pump, making sure the side-port location matched that of the pump. Both, the pump and the template were held for stability. The needle provided in the Medtronic Kit was then introduced thru the center of the template and into the central port. The pump content was aspirated and discarded volume documented. The new medication was slowly infused into the pump, thru the filter, making sure to avoid overpressure of the device. The needle was then removed and the area cleansed, making sure to leave some of the prepping solution back to take advantage of its long term bactericidal properties. The pump was interrogated and programmed to reflect the correct medication, volume, and dosage. The program was printed and taken to the physician for approval. Once checked and signed by the physician, a copy was provided to the patient and another scanned into the EMR. Start Time:  1154 hrs. End Time:   hrs. Materials & Medications: Medtronic Refill Kit Medication(s): Please see chart orders for details.  Imaging Guidance:  Type of Imaging Technique: None used Indication(s): N/A Exposure Time: No patient exposure Contrast: None used. Fluoroscopic Guidance: N/A Ultrasound Guidance: N/A Interpretation: N/A  Antibiotic Prophylaxis:  Indication(s): None identified Antibiotic given: None  Post-operative Assessment:  EBL: None Complications: No immediate post-treatment complications observed by team, or reported by patient. Note: The patient tolerated the entire procedure well. A repeat set of vitals were taken after the procedure and the patient was kept under observation following institutional policy, for this type of procedure. Post-procedural neurological assessment was performed, showing return to baseline, prior to discharge. The patient was provided with post-procedure discharge instructions, including a section on how to identify potential problems. Should any problems arise concerning this procedure, the patient was given instructions to immediately contact us, at any time, without hesitation. In any case, we plan to contact the patient by telephone for a follow-up status report regarding this interventional procedure. Comments:  No additional relevant information.  Controlled Substance Pharmacotherapy Assessment REMS (Risk Evaluation and Mitigation Strategy)  Analgesic:Oxycodone IR 5 mg every 8 hours (15 mg/day) MME/day:22 mg/day of oral medication.  Zenovia Jarred, RN  08/08/2016 11:33 AM  Signed Nursing Pain Medication Assessment:  Safety precautions to be maintained throughout the outpatient stay will include: orient to surroundings, keep bed in low position, maintain call bell within reach at all times, provide assistance with transfer out of bed and ambulation.  Medication Inspection Compliance: Pill count conducted under aseptic conditions, in front of the  patient. Neither the pills nor the bottle was removed from the patient's sight at any time. Once count was completed pills were immediately returned to the patient in their original bottle. Pill Count: 80 of 90 pills remain Bottle Appearance: Standard pharmacy container. Clearly labeled. Medication: Oxycodone IR Filled Date: 01 / 26 / 2018  Zenovia Jarred, RN  08/08/2016 11:29 AM  Signed Safety precautions to be maintained throughout the outpatient stay will include:  orient to surroundings, keep bed in low position, maintain call bell within reach at all times, provide assistance with transfer out of bed and ambulation.   Pharmacokinetics: Liberation and absorption (onset of action): WNL Distribution (time to peak effect): WNL Metabolism and excretion (duration of action): WNL         Pharmacodynamics: Desired effects: Analgesia: Ms. Mauney reports >50% benefit. Functional ability: Patient reports that medication allows her to accomplish basic ADLs Clinically meaningful improvement in function (CMIF): Sustained CMIF goals met Perceived effectiveness: Described as relatively effective, allowing for increase in activities of daily living (ADL) Undesirable effects: Side-effects or Adverse reactions: None reported Monitoring: [employer] PMP: Online review of the past 62-monthperiod conducted. Compliant with practice rules and regulations List of all UDS test(s) done:  Lab Results  Component Value Date   TOXASSSELUR FINAL 06/11/2016   TReaderFINAL 10/18/2015   TMyers FlatFINAL 07/27/2015   Last UDS on record: ToxAssure Select 13  Date Value Ref Range Status  06/11/2016 FINAL  Final    Comment:    ==================================================================== TOXASSURE SELECT 13 (MW) ==================================================================== Test                             Result       Flag       Units Drug Present and Declared for Prescription Verification   Oxycodone                       1626         EXPECTED   ng/mg creat   Oxymorphone                    262          EXPECTED   ng/mg creat   Noroxycodone                   3199         EXPECTED   ng/mg creat    Sources of oxycodone include scheduled prescription medications.    Oxymorphone and noroxycodone are expected metabolites of    oxycodone. Oxymorphone is also available as a scheduled    prescription medication.   Fentanyl                       14           EXPECTED   ng/mg creat   Norfentanyl                    34           EXPECTED   ng/mg creat    Source of fentanyl is a scheduled prescription medication,    including IV, patch, and transmucosal formulations. Norfentanyl    is an expected metabolite of fentanyl. ==================================================================== Test                      Result    Flag   Units      Ref Range   Creatinine              190              mg/dL      >=20 ==================================================================== Declared Medications:  The flagging and interpretation on this report are based on the  following declared medications.  Unexpected results may arise from  inaccuracies in the declared  medications.  **Note: The testing scope of this panel includes these medications:  Fentanyl  Oxycodone  **Note: The testing scope of this panel does not include following  reported medications:  Aspirin (Aspirin 81)  Bupropion  Cholecalciferol  Chondroitin (Glucosamine-Chondroitin)  Glucosamine (Glucosamine-Chondroitin)  Isosorbide (Isordil)  Magnesium  Meloxicam (Mobic)  Multivitamin  Nitroglycerin (Nitrostat)  Omega-3 Fatty Acids (Fish Oil)  Paroxetine (Paxil)  Polyethylene Glycol  Pregabalin (Lyrica)  Rosuvastatin (Crestor)  Supplement  Tizanidine (Zanaflex)  Ubiquinone (CoQ10)  Vitamin E ==================================================================== For clinical consultation, please call [phone]. ====================================================================    UDS interpretation: Compliant          Medication Assessment Form: Reviewed. Patient indicates being compliant with therapy Treatment compliance: Compliant Risk Assessment Profile: Aberrant behavior: See prior evaluations. None observed or detected today Comorbid factors increasing risk of overdose: See prior notes. No additional risks detected today Risk of substance use disorder (SUD): Low Opioid Risk Tool (ORT) Total Score: 3  Interpretation Table:  Score <3 = Low Risk for SUD  Score between 4-7 = Moderate Risk for SUD  Score >8 = High Risk for Opioid Abuse   Risk Mitigation Strategies:  Patient Counseling: Covered Patient-Prescriber Agreement (PPA): Present and active  Notification to other healthcare providers: Done  Pharmacologic Plan: No change in therapy, at this time  Assessment  Primary Diagnosis & Pertinent Problem List: The primary encounter diagnosis was Chronic pain syndrome. Diagnoses of Fibromyalgia, Musculoskeletal pain, Primary osteoarthritis involving multiple joints, Failed back surgical syndrome, Failed cervical surgery syndrome (ACDF), Chronic knee pain (Location of Primary Source of Pain) (Right), Encounter for adjustment or management of infusion pump, Long term current use of opiate analgesic, Presence of functional implant (Medtronic programmable intrathecal pump) (Right abdominal area), and Presence of intrathecal pump (Medtronic intrathecal programmable pump) (40 mL pump) were also pertinent to this visit.  Status Diagnosis  Controlled Controlled Controlled 1. Chronic pain syndrome   2. Fibromyalgia   3. Musculoskeletal pain   4. Primary osteoarthritis involving multiple joints   5. Failed back surgical syndrome   6. Failed cervical surgery syndrome (ACDF)   7. Chronic knee pain (Location of Primary Source of Pain) (Right)   8. Encounter for adjustment or management of  infusion pump   9. Long term current use of opiate analgesic   10. Presence of functional implant (Medtronic programmable intrathecal pump) (Right abdominal area)   11. Presence of intrathecal pump (Medtronic intrathecal programmable pump) (40 mL pump)      Plan of Care  Disposition: Discharge home  Discharge Date & Time: 08/08/2016; 1212 hrs.  Physician-requested Follow-up:  Return for Pump Refill (as per pump program).  Future Appointments Date Time Provider Department Center  11/01/2016 10:30 AM Delano Metz, MD [facility] None  12/11/2016 10:30 AM Delano Metz, MD [facility] None   Medications ordered for procedure: Meds ordered this encounter  Medications  . oxyCODONE (OXY IR/ROXICODONE) 5 MG immediate release tablet    Sig: Take 1 tablet (5 mg total) by mouth every 8 (eight) hours as needed for severe pain.    Dispense:  90 tablet    Refill:  0    Do not place this medication, or any other prescription from our practice, on "Automatic Refill". Patient may have prescription filled one day early if pharmacy is closed on scheduled refill date. Do not fill until: 08/08/16 To last until: 09/07/16  . oxyCODONE (OXY IR/ROXICODONE) 5 MG immediate release tablet    Sig: Take 1 tablet (5 mg total)  by mouth every 8 (eight) hours as needed for severe pain.    Dispense:  90 tablet    Refill:  0    Do not place this medication, or any other prescription from our practice, on "Automatic Refill". Patient may have prescription filled one day early if pharmacy is closed on scheduled refill date. Do not fill until: 09/07/16 To last until: 10/07/16  . oxyCODONE (OXY IR/ROXICODONE) 5 MG immediate release tablet    Sig: Take 1 tablet (5 mg total) by mouth every 8 (eight) hours as needed for severe pain.    Dispense:  90 tablet    Refill:  0    Do not place this medication, or any other prescription from our practice, on "Automatic Refill". Patient may have prescription filled one day early  if pharmacy is closed on scheduled refill date. Do not fill until: 10/07/16 To last until: 11/06/16  . pregabalin (LYRICA) 100 MG capsule    Sig: Take 1 capsule (100 mg total) by mouth 3 (three) times daily.    Dispense:  270 capsule    Refill:  0    Do not place this medication, or any other prescription from our practice, on "Automatic Refill". Patient may have prescription filled one day early if pharmacy is closed on scheduled refill date.  Marland Kitchen tiZANidine (ZANAFLEX) 4 MG capsule    Sig: Take 1 capsule (4 mg total) by mouth 3 (three) times daily.    Dispense:  270 capsule    Refill:  0    Do not add this medication to the electronic "Automatic Refill" notification system. Patient may have prescription filled one day early if pharmacy is closed on scheduled refill date.  . meloxicam (MOBIC) 15 MG tablet    Sig: Take 1 tablet (15 mg total) by mouth daily.    Dispense:  90 tablet    Refill:  0    Do not place medication on "Automatic Refill". Fill one day early if pharmacy is closed on scheduled refill date.   Medications administered: Ms. Mcneece had no medications administered during this visit.  See the medical record for exact dosing, route, and time of administration.  Lab-work, Procedure(s), & Referral(s) Ordered: Orders Placed This Encounter  Procedures  . PUMP REFILL  . PUMP REFILL  . Discharge instructions  . Follow-up  . Informed Consent Details: Transcribe to consent form and obtain patient signature  . Provider attestation of informed consent for procedure/surgical case  . Verify informed consent   Imaging Ordered: No results found for this or any previous visit. New Prescriptions   No medications on file   Primary Care Physician: Melinda Fenton, NP Location: [facility] Note by: Melinda [name]. Melinda [name], M.D, DABA, DABAPM, DABPM, DABIPP, FIPP Date: 08/08/2016; Time: 1:36 PM  Disclaimer:  Medicine is not an Chief Strategy Officer. The only guarantee  in medicine is that nothing is guaranteed. It is important to note that the decision to proceed with this intervention was based on the information collected from the patient. The Data and conclusions were drawn from the patient's questionnaire, the interview, and the physical examination. Because the information was provided in large part by the patient, it cannot be guaranteed that it has not been purposely or unconsciously manipulated. Every effort has been made to obtain as much relevant data as possible for this evaluation. It is important to note that the conclusions that lead to this procedure are derived in large part from the available data.  Always take into account that the treatment will also be dependent on availability of resources and existing treatment guidelines, considered by other Pain Management Practitioners as being common knowledge and practice, at the time of the intervention. For Medico-Legal purposes, it is also important to point out that variation in procedural techniques and pharmacological choices are the acceptable norm. The indications, contraindications, technique, and results of the above procedure should only be interpreted and judged by a Board-Certified Interventional Pain Specialist with extensive familiarity and expertise in the same exact procedure and technique. Attempts at providing opinions without similar or greater experience and expertise than that of the treating physician will be considered as inappropriate and unethical, and shall result in a formal complaint to the state medical board and applicable specialty societies.  Instructions provided at this appointment: Patient Instructions  Today you had your IT pump refilled..  You got prescriptions for oxycodone x 3 and Lyrica.     You also have prescriptions for Mobic and Tizanidine at the pharmacy. Opioid Overdose Introduction Opioids are substances that relieve pain by binding to pain receptors in your brain and  spinal cord. Opioids include illegal drugs, such as heroin, as well as prescription pain medicines.An opioid overdose happens when you take too much of an opioid substance. This can happen with any type of opioid, including:  Heroin.  Morphine.  Codeine.  Methadone.  Oxycodone.  Hydrocodone.  Fentanyl.  Hydromorphone.  Buprenorphine. The effects of an overdose can be mild, dangerous, or even deadly. Opioid overdose is a medical emergency. What are the causes? This condition may be caused by:  Taking too much of an opioid by accident.  Taking too much of an opioid on purpose.  An error made by a health care provider who prescribes a medicine.  An error made by the pharmacist who fills the prescription order.  Using more than one substance that contains opioids at the same time.  Mixing an opioid with a substance that affects your heart, breathing, or blood pressure. These include alcohol, tranquilizers, sleeping pills, illegal drugs, and some over-the-counter medicines. What increases the risk? This condition is more likely in:  Children. They may be attracted to colorful pills. Because of a child's small size, even a small amount of a drug can be dangerous.  Elderly people. They may be taking many different drugs. Elderly people may have difficulty reading labels or remembering when they last took their medicine.  People who take an opioid on a long-term basis.  People who use:  Illegal drugs.  Other substances, including alcohol, while using an opioid.  People who have:  A history of drug or alcohol abuse.  Certain mental health conditions.  People who take opioids that are not prescribed for them. What are the signs or symptoms? Symptoms of this condition depend on the type of opioid and the amount that was taken. Common symptoms include:  Sleepiness or difficulty waking from sleep.  Confusion.  Slurred speech.  Slowed breathing and a slow  pulse.  Nausea and vomiting.  Abnormally small pupils. Signs and symptoms that require emergency treatment include:  Cold, clammy, and pale skin.  Blue lips and fingernails.  Vomiting.  Gurgling sounds in the throat.  A pulse that is very slow or difficult to detect.  Breathing that is very slow, noisy, or difficult to detect.  Limp body.  Inability to respond to speech or be awakened from sleep (stupor). How is this diagnosed? This condition is diagnosed based on your  symptoms. It is important to tell your health care provider:  All of the opioidsthat you took.  When you took the opioids.  Whether you were drinking alcohol or using other substances. Your health care provider will do a physical exam. This exam may include:  Checking and monitoring your heart rate and rhythm, your breathing rate and depth, your temperature, and your blood pressure (vital signs).  Checking for abnormally small pupils.  Measuring oxygen levels in your blood. You may also have blood tests or urine tests. How is this treated? Supporting your vital signs and your breathing is the first step in treating an opioid overdose. Treatment may also include:  Giving fluids and minerals (electrolytes) through an IV tube.  Inserting a breathing tube (endotracheal tube) in your airway to help you breathe.  Giving oxygen.  Passing a tube through your nose and into your stomach (NG tube, or nasogastric tube) to wash out your stomach.  Giving medicines that:  Increase your blood pressure.  Absorb any opioid that is in your digestive system.  Reverse the effects of the opioid (naloxone).  Ongoing counseling and mental health support if you intentionally overdosed or used an illegal drug. Follow these instructions at home:  Take over-the-counter and prescription medicines only as told by your health care provider. Always ask your health care provider about possible side effects and interactions  of any new medicine that you start taking.  Keep a list of all of the medicines that you take, including over-the-counter medicines. Bring this list with you to all of your medical visits.  Drink enough fluid to keep your urine clear or pale yellow.  Keep all follow-up visits as told by your health care provider. This is important. How is this prevented?  Get help if you are struggling with:  Alcohol or drug use.  Depression or another mental health problem.  Keep the phone number of your local poison control center near your phone or on your cell phone.  Store all medicines in safety containers that are out of the reach of children.  Read the drug inserts that come with your medicines.  Do not drink alcohol when taking opioids.  Do not use illegal drugs.  Do not take opioid medicines that are not prescribed for you. Contact a health care provider if:  Your symptoms return.  You develop new symptoms or side effects when you are taking medicines. Get help right away if:  You think that you or someone else may have taken too much of an opioid. The hotline of the [facility] is [phone].  You or someone else is having symptoms of an opioid overdose.  You have serious thoughts about hurting yourself or others.  You have:  Chest pain.  Difficulty breathing.  A loss of consciousness. Opioid overdose is an emergency. Do not wait to see if the symptoms will go away. Get medical help right away. Call your local emergency services (911 in the [place] Do not drive yourself to the hospital.  This information is not intended to replace advice given to you by your health care provider. Make sure you discuss any questions you have with your health care provider. Document Released: 07/26/2004 Document Revised: 11/24/2015 Document Reviewed: 07/01/2013  2017 Elsevier

## 2016-08-09 ENCOUNTER — Other Ambulatory Visit: Payer: Self-pay | Admitting: Internal Medicine

## 2016-08-09 ENCOUNTER — Telehealth: Payer: Self-pay | Admitting: *Deleted

## 2016-08-09 DIAGNOSIS — M150 Primary generalized (osteo)arthritis: Principal | ICD-10-CM

## 2016-08-09 DIAGNOSIS — M159 Polyosteoarthritis, unspecified: Secondary | ICD-10-CM

## 2016-08-09 DIAGNOSIS — M8949 Other hypertrophic osteoarthropathy, multiple sites: Secondary | ICD-10-CM

## 2016-08-09 NOTE — Telephone Encounter (Signed)
No problems following pump refill.

## 2016-08-18 MED FILL — Medication: INTRATHECAL | Qty: 1 | Status: AC

## 2016-08-28 ENCOUNTER — Other Ambulatory Visit: Payer: Self-pay | Admitting: Internal Medicine

## 2016-08-28 NOTE — Telephone Encounter (Signed)
Last filled 12/01/15 #90 with 2 refills---MCR wellness was 05/2016--please advise

## 2016-09-06 ENCOUNTER — Encounter: Payer: Self-pay | Admitting: Family Medicine

## 2016-09-06 ENCOUNTER — Ambulatory Visit (INDEPENDENT_AMBULATORY_CARE_PROVIDER_SITE_OTHER): Payer: Medicare Other | Admitting: Family Medicine

## 2016-09-06 DIAGNOSIS — L03032 Cellulitis of left toe: Secondary | ICD-10-CM

## 2016-09-06 DIAGNOSIS — E1142 Type 2 diabetes mellitus with diabetic polyneuropathy: Secondary | ICD-10-CM | POA: Diagnosis not present

## 2016-09-06 DIAGNOSIS — Z89421 Acquired absence of other right toe(s): Secondary | ICD-10-CM | POA: Diagnosis not present

## 2016-09-06 MED ORDER — SULFAMETHOXAZOLE-TRIMETHOPRIM 800-160 MG PO TABS: 1.0000 | ORAL_TABLET | Freq: Two times a day (BID) | ORAL | 0 refills | Status: DC

## 2016-09-06 NOTE — Progress Notes (Signed)
Pre visit review using our clinic review tool, if applicable. No additional management support is needed unless otherwise documented below in the visit note. 

## 2016-09-06 NOTE — Progress Notes (Signed)
Subjective:   Patient ID: Melinda [name], female    DOB: [birth year], 61 y.o.   MRN: [id number]  Melinda [name] is a pleasant 61 y.o. 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  Had drained some pus but stopped doing that. Now tender and warm.  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  Denies fever, chills, nausea or vomiting.  Current Outpatient Prescriptions on File Prior to Visit  Medication Sig Dispense Refill  . ACCU-CHEK AVIVA PLUS test strip TEST 2 TIMES DAY AS NEEDED **E11.9** 100 each 6  . ACCU-CHEK SOFTCLIX LANCETS lancets 1 each by Other route 2 (two) times daily. Use as instructed 100 each 2  . AMBULATORY NON FORMULARY MEDICATION Medication Name: CPAP MASK OF CHOICE FOR HOME DEVICE 1 each 0  . aspirin 81 MG tablet Take 81 mg by mouth daily.    Marland Kitchen buPROPion (WELLBUTRIN XL) 150 MG 24 hr tablet TAKE 1 TABLET BY MOUTH DAILY 90 tablet 1  . Cholecalciferol (VITAMIN D3) 2000 units TABS Take 2,000 Units by mouth daily.    . Chromium Picolinate 800 MCG TABS Take 800 mcg by mouth daily.    . Coenzyme Q10 (CO Q 10) 100 MG CAPS Take 200 Doses by mouth.    . Ginger, Zingiber officinalis, (GINGER ROOT PO) Take 1.1 g by mouth daily.    Marland Kitchen glucosamine-chondroitin 500-400 MG tablet Take 1,500 tablets by mouth 3 (three) times daily.    Marland Kitchen glucose blood test strip 1 each by Other route. Use as instructed    . isosorbide dinitrate (ISORDIL) 30 MG tablet Take 1 tablet (30 mg total) by mouth daily. 90 tablet 3  . MAGNESIUM MALATE PO Take 115 mg by mouth daily.    . meloxicam (MOBIC) 15 MG tablet Take 1 tablet (15 mg total) by mouth daily. 90 tablet 0  . Multiple Vitamin (MULTIVITAMIN) tablet Take 1 tablet by mouth  daily.    . nitroGLYCERIN (NITROSTAT) 0.4 MG SL tablet Place 1 tablet (0.4 mg total) under the tongue every 5 (five) minutes as needed for chest pain. 25 tablet prn  . Nutritional Supplements (GRAPESEED EXTRACT PO) Take 400 mg by mouth daily.    . Omega-3 Fatty Acids (FISH OIL) 1200 MG CAPS Take by mouth.    . oxyCODONE (OXY IR/ROXICODONE) 5 MG immediate release tablet Take 1 tablet (5 mg total) by mouth every 8 (eight) hours as needed for severe pain. 90 tablet 0  . [START ON 09/07/2016] oxyCODONE (OXY IR/ROXICODONE) 5 MG immediate release tablet Take 1 tablet (5 mg total) by mouth every 8 (eight) hours as needed for severe pain. 90 tablet 0  . [START ON 10/07/2016] oxyCODONE (OXY IR/ROXICODONE) 5 MG immediate release tablet Take 1 tablet (5 mg total) by mouth every 8 (eight) hours as needed for severe pain. 90 tablet 0  . PARoxetine (PAXIL) 20 MG tablet TAKE ONE-HALF OF A TABLET BY MOUTH TWICEDAILY 90 tablet 2  . polyethylene glycol (MIRALAX / GLYCOLAX) packet Take 17 g by mouth daily.    . pregabalin (LYRICA) 100 MG capsule Take 1 capsule (100 mg total) by mouth 3 (three) times daily.  270 capsule 0  . rosuvastatin (CRESTOR) 20 MG tablet Take 1 tablet (20 mg total) by mouth daily. 90 tablet 3  . tiZANidine (ZANAFLEX) 4 MG capsule Take 1 capsule (4 mg total) by mouth 3 (three) times daily. 270 capsule 0  . vitamin E 400 UNIT capsule Take 400 Units by mouth daily.    Marland Kitchen oxyCODONE (OXY IR/ROXICODONE) 5 MG immediate release tablet Take 1 tablet (5 mg total) by mouth 3 (three) times daily. 90 tablet 0  . PAIN MANAGEMENT IT PUMP REFILL 1 each by Intrathecal route once. Medication: PF Fentanyl 500.0 mcg/ml PF Baclofen 300.7mcg/ml PF Bupivicaine 20.0 mg/ml Total Volume: 40 ml Needed by 08-08-16 @ 1000 1 each 0   No current facility-administered medications on file prior to visit.     No Known Allergies  Past Medical History:  Diagnosis Date  . Anemia    years ago  . Bulging disc   . CAD (coronary  artery disease) 2009   s/p stent to LAD  . 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 Chicken pox   . Coronary arteritis   . Degenerative disc disease   . Diabetes mellitus without complication ([place])   . Facet joint disease   . Fibromyalgia   . Heart disease   . Hyperlipidemia   . MCL deficiency, knee   . MRSA (methicillin resistant staph aureus) culture positive 2011   GREAT TOE RIGHT FOOT  . Neuropathy ([place]) 04/18/2010  . Peripheral neuropathy ([place])   . Sleep apnea    uses CPAP, sleep study at [facility]  . Spinal stenosis     Past Surgical History:  Procedure Laterality Date  . ABLATION     UTERUS  . ABLATION     HEART  . ANTERIOR CERVICAL DECOMP/DISCECTOMY FUSION N/A 07/28/2014   Procedure: ANTERIOR CERVICAL DECOMPRESSION/DISCECTOMY FUSION CERVICAL 3-4,4-5,5-6 LEVELS WITH INSTRUMENTATION AND ALLOGRAFT;  Surgeon: Sinclair Ship, MD;  Location: [facility];  Service: Orthopedics;  Laterality: N/A;  Anterior cervical decompression fusion, cervical 3-4, cervical 4-5, cervical 5-6 with instrumentation and allograft  . BACK SURGERY     X 3 1979, 1994, 1995  . CHOLECYSTECTOMY  2003  . EYE SURGERY Bilateral   . FOOT SURGERY Right    BIG TOE  . FOOT SURGERY Bilateral    PLANTAR FASCIITIS  . FOOT SURGERY Right    2ND TOE  . FRACTURE SURGERY Right 2012   carpal tunnel  . GALLBLADDER SURGERY    . HAND SURGERY Right   . HAND SURGEY Left   . HEART STENT  2009   LAD  . INFUSION PUMP IMPLANTATION     X2 with morphine and baclofen    Family History  Problem Relation Age of Onset  . Cancer Mother     lung  . Diabetes Father   . Heart disease Maternal Grandfather   . Diabetes Paternal Grandmother   . Cancer Paternal Grandfather     colon  . Stroke Neg Hx     Social History   Social History  . Marital status: Significant Other    Spouse name: N/A  . Number of children: N/A  . Years of education: N/A   Occupational History  . Not on file.    Social History Main Topics  . Smoking status: Former Smoker    Packs/day: 1.50    Years: 32.00    Types: Cigarettes    Quit date: 07/22/2007  . Smokeless tobacco: Never Used  .  Alcohol use 0.0 oz/week     Comment: occ  . Drug use: No  . Sexual activity: Yes   Other Topics Concern  . Not on file   Social History Narrative  . No narrative on file   The PMH, PSH, Social History, Family History, Medications, and allergies have been reviewed in [facility], and have been updated if relevant.  Review of Systems  Constitutional: Negative for fatigue and fever.  Musculoskeletal:       +toe pain  Skin: Positive for color change and wound.  All other systems reviewed and are negative.      Objective:    BP 100/60 (BP Location: Right Arm, Patient Position: Sitting, Cuff Size: Large)   Pulse 78   Temp 98.1 F (36.7 C) (Oral)   Wt 231 lb (104.8 kg)   SpO2 95%   BMI 33.15 kg/m    Physical Exam  Constitutional: She is oriented to person, place, and time. She appears well-developed and well-nourished. No distress.  HENT:  Head: Normocephalic and atraumatic.  Eyes: Conjunctivae are normal.  Cardiovascular: Normal rate.   Pulmonary/Chest: Effort normal.  Musculoskeletal:       Feet:  Neurological: She is alert and oriented to person, place, and time. No cranial nerve deficit.  Skin: She is not diaphoretic.  Psychiatric: She has a normal mood and affect. Her behavior is normal. Judgment and thought content normal.  Nursing note and vitals reviewed.         Assessment & Plan:   Paronychia of great toe of left foot - Plan: Ambulatory referral to Podiatry No Follow-up on file.

## 2016-09-06 NOTE — Patient Instructions (Signed)
Great to see you. Please take Bactrim as directed-  1 tablet twice daily for 10 days.  Stop by to see Rosaria Ferries on your way out.

## 2016-09-06 NOTE — Assessment & Plan Note (Signed)
New in complicated patient with h/o diabetes and previous amputations. Will place on oral bactrim and refer urgently to podiatry for treatment. The patient indicates understanding of these issues and agrees with the plan.

## 2016-09-13 ENCOUNTER — Encounter: Payer: Self-pay | Admitting: Internal Medicine

## 2016-09-13 ENCOUNTER — Telehealth: Payer: Self-pay | Admitting: Cardiology

## 2016-09-13 ENCOUNTER — Ambulatory Visit (INDEPENDENT_AMBULATORY_CARE_PROVIDER_SITE_OTHER): Payer: Medicare Other | Admitting: Internal Medicine

## 2016-09-13 VITALS — BP 104/60 | Wt 234.0 lb

## 2016-09-13 DIAGNOSIS — M5412 Radiculopathy, cervical region: Secondary | ICD-10-CM | POA: Diagnosis not present

## 2016-09-13 MED ORDER — PREDNISONE 10 MG PO TABS: ORAL_TABLET | ORAL | 0 refills | Status: DC

## 2016-09-13 NOTE — Patient Instructions (Signed)
Cervical Radiculopathy Cervical radiculopathy means that a nerve in the neck is pinched or bruised. This can cause pain or loss of feeling (numbness) that runs from your neck to your arm and fingers. Follow these instructions at home: Managing pain   Take over-the-counter and prescription medicines only as told by your doctor.  If directed, put ice on the injured or painful area.  Put ice in a plastic bag.  Place a towel between your skin and the bag.  Leave the ice on for 20 minutes, 2-3 times per day.  If ice does not help, you can try using heat. Take a warm shower or warm bath, or use a heat pack as told by your doctor.  You may try a gentle neck and shoulder massage. Activity   Rest as needed. Follow instructions from your doctor about any activities to avoid.  Do exercises as told by your doctor or physical therapist. General instructions   If you were given a soft collar, wear it as told by your doctor.  Use a flat pillow when you sleep.  Keep all follow-up visits as told by your doctor. This is important. Contact a doctor if:  Your condition does not improve with treatment. Get help right away if:  Your pain gets worse and is not controlled with medicine.  You lose feeling or feel weak in your hand, arm, face, or leg.  You have a fever.  You have a stiff neck.  You cannot control when you poop or pee (have incontinence).  You have trouble with walking, balance, or talking. This information is not intended to replace advice given to you by your health care provider. Make sure you discuss any questions you have with your health care provider. Document Released: 06/07/2011 Document Revised: 11/24/2015 Document Reviewed: 08/12/2014 Elsevier Interactive Patient Education  2017 Elsevier Inc.  

## 2016-09-13 NOTE — Progress Notes (Signed)
Subjective:    Patient ID: Melinda [name], female    DOB: 1955/10/16, 61 y.o.   MRN: [id number]  HPI  Pt presents to the clinic today with c/o pain and numbness in her left hand. She reports this started a few weeks ago, but seems to be getting worse. She describes the pain as electricity or a lightening bolt, with throbbing in her hand. It hurts so bad, it is waking her up at night. CT of the neck from 08/2014 showed:  IMPRESSION:  1. Small anterior epidural hematoma from C2-C6.  2. Left foraminal stenosis at C3-4 and bilateral slight foraminal  stenosis at C5-6 due to uncinate spurs.  3. Excellent position of the hardware with anatomic alignment of the  cervical vertebra.   She does have a history of chronic neck pain and has had cervical surgery in the past. She is currently following with pain management. She is on Meloxicam, Zanaflex, Oxycodone and has a Fentanyl pain pump.  Review of Systems      Past Medical History:  Diagnosis Date  . Anemia    years ago  . Bulging disc   . CAD (coronary artery disease) 2009   s/p stent to LAD  . 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 Chicken pox   . Coronary arteritis   . Degenerative disc disease   . Diabetes mellitus without complication ([place])   . Facet joint disease   . Fibromyalgia   . Heart disease   . Hyperlipidemia   . MCL deficiency, knee   . MRSA (methicillin resistant staph aureus) culture positive 2011   GREAT TOE RIGHT FOOT  . Neuropathy ([place]) 04/18/2010  . Peripheral neuropathy ([place])   . Sleep apnea    uses CPAP, sleep study at [facility]  . Spinal stenosis     Current Outpatient Prescriptions  Medication Sig Dispense Refill  . ACCU-CHEK AVIVA PLUS test strip TEST 2 TIMES DAY AS NEEDED **E11.9** 100 each 6  . ACCU-CHEK SOFTCLIX LANCETS lancets 1 each by Other route 2 (two) times daily. Use as instructed 100 each 2  . AMBULATORY NON FORMULARY MEDICATION Medication Name: CPAP MASK OF  CHOICE FOR HOME DEVICE 1 each 0  . aspirin 81 MG tablet Take 81 mg by mouth daily.    Marland Kitchen buPROPion (WELLBUTRIN XL) 150 MG 24 hr tablet TAKE 1 TABLET BY MOUTH DAILY 90 tablet 1  . Cholecalciferol (VITAMIN D3) 2000 units TABS Take 2,000 Units by mouth daily.    . Chromium Picolinate 800 MCG TABS Take 800 mcg by mouth daily.    . Coenzyme Q10 (CO Q 10) 100 MG CAPS Take 200 Doses by mouth.    . Ginger, Zingiber officinalis, (GINGER ROOT PO) Take 1.1 g by mouth daily.    Marland Kitchen glucosamine-chondroitin 500-400 MG tablet Take 1,500 tablets by mouth 3 (three) times daily.    Marland Kitchen glucose blood test strip 1 each by Other route. Use as instructed    . isosorbide dinitrate (ISORDIL) 30 MG tablet Take 1 tablet (30 mg total) by mouth daily. 90 tablet 3  . MAGNESIUM MALATE PO Take 115 mg by mouth daily.    . meloxicam (MOBIC) 15 MG tablet Take 1 tablet (15 mg total) by mouth daily. 90 tablet 0  . Multiple Vitamin (MULTIVITAMIN) tablet Take 1 tablet by mouth daily.    . nitroGLYCERIN (NITROSTAT) 0.4 MG SL tablet Place 1 tablet (0.4 mg total) under the tongue every 5 (five) minutes  as needed for chest pain. 25 tablet prn  . Nutritional Supplements (GRAPESEED EXTRACT PO) Take 400 mg by mouth daily.    . Omega-3 Fatty Acids (FISH OIL) 1200 MG CAPS Take by mouth.    . oxyCODONE (OXY IR/ROXICODONE) 5 MG immediate release tablet Take 1 tablet (5 mg total) by mouth 3 (three) times daily. 90 tablet 0  . oxyCODONE (OXY IR/ROXICODONE) 5 MG immediate release tablet Take 1 tablet (5 mg total) by mouth every 8 (eight) hours as needed for severe pain. 90 tablet 0  . oxyCODONE (OXY IR/ROXICODONE) 5 MG immediate release tablet Take 1 tablet (5 mg total) by mouth every 8 (eight) hours as needed for severe pain. 90 tablet 0  . [START ON 10/07/2016] oxyCODONE (OXY IR/ROXICODONE) 5 MG immediate release tablet Take 1 tablet (5 mg total) by mouth every 8 (eight) hours as needed for severe pain. 90 tablet 0  . PAIN MANAGEMENT IT PUMP REFILL 1  each by Intrathecal route once. Medication: PF Fentanyl 500.0 mcg/ml PF Baclofen 300.86mcg/ml PF Bupivicaine 20.0 mg/ml Total Volume: 40 ml Needed by 08-08-16 @ 1000 1 each 0  . PARoxetine (PAXIL) 20 MG tablet TAKE ONE-HALF OF A TABLET BY MOUTH TWICEDAILY 90 tablet 2  . polyethylene glycol (MIRALAX / GLYCOLAX) packet Take 17 g by mouth daily.    . pregabalin (LYRICA) 100 MG capsule Take 1 capsule (100 mg total) by mouth 3 (three) times daily. 270 capsule 0  . rosuvastatin (CRESTOR) 20 MG tablet Take 1 tablet (20 mg total) by mouth daily. 90 tablet 3  . sulfamethoxazole-trimethoprim (BACTRIM DS,SEPTRA DS) 800-160 MG tablet Take 1 tablet by mouth 2 (two) times daily. 20 tablet 0  . tiZANidine (ZANAFLEX) 4 MG capsule Take 1 capsule (4 mg total) by mouth 3 (three) times daily. 270 capsule 0  . vitamin E 400 UNIT capsule Take 400 Units by mouth daily.     No current facility-administered medications for this visit.     No Known Allergies  Family History  Problem Relation Age of Onset  . Cancer Mother     lung  . Diabetes Father   . Heart disease Maternal Grandfather   . Diabetes Paternal Grandmother   . Cancer Paternal Grandfather     colon  . Stroke Neg Hx     Social History   Social History  . Marital status: Significant Other    Spouse name: N/A  . Number of children: N/A  . Years of education: N/A   Occupational History  . Not on file.   Social History Main Topics  . Smoking status: Former Smoker    Packs/day: 1.50    Years: 32.00    Types: Cigarettes    Quit date: 07/22/2007  . Smokeless tobacco: Never Used  . Alcohol use 0.0 oz/week     Comment: occ  . Drug use: No  . Sexual activity: Yes   Other Topics Concern  . Not on file   Social History Narrative  . No narrative on file     Constitutional: Denies fever, malaise, fatigue, headache or abrupt weight changes.  Musculoskeletal: pt reports chronic neck pain. Denies difficulty with gait, muscle pain or joint  swelling.  Skin: Denies redness, rashes, lesions or ulcercations.  Neurological: Pt reports pain and numbness in left arm. Denies dizziness, difficulty with memory, difficulty with speech or problems with balance and coordination.    No other specific complaints in a complete review of systems (except as listed in HPI  above).  Objective:   Physical Exam  BP 104/60   Wt 234 lb (106.1 kg)   BMI 33.58 kg/m  Wt Readings from Last 3 Encounters:  09/13/16 234 lb (106.1 kg)  09/06/16 231 lb (104.8 kg)  08/08/16 226 lb (102.5 kg)    General: Appears her stated age, obese in NAD. Cardiovascular: Radial pulse 2+ on the left. Musculoskeletal: Normal flexion, extension and rotation of the cervical spine. No bony tenderness noted over the cervical spine. Normal internal and external rotation of the left shoulder. Neurological: Alert and oriented. Fine motor coordination normal. Sensation intact to BUE. Negative Tinel's. Negative Phalen's.  BMET    Component Value Date/Time   NA 141 05/18/2016 1526   NA 135 (L) 06/30/2013 1203   K 4.4 05/18/2016 1526   K 4.1 06/30/2013 1203   CL 104 05/18/2016 1526   CL 102 06/30/2013 1203   CO2 30 05/18/2016 1526   CO2 32 06/30/2013 1203   GLUCOSE 77 05/18/2016 1526   GLUCOSE 143 (H) 06/30/2013 1203   BUN 28 (H) 05/18/2016 1526   BUN 19 (H) 06/30/2013 1203   CREATININE 1.09 05/18/2016 1526   CREATININE 0.89 06/30/2013 1203   CALCIUM 9.6 05/18/2016 1526   CALCIUM 9.7 06/30/2013 1203   GFRNONAA >60 09/10/2015 1751   GFRNONAA >60 06/30/2013 1203   GFRAA >60 09/10/2015 1751   GFRAA >60 06/30/2013 1203    Lipid Panel     Component Value Date/Time   CHOL 133 05/18/2016 1526   TRIG 205.0 (H) 05/18/2016 1526   HDL 37.20 (L) 05/18/2016 1526   CHOLHDL 4 05/18/2016 1526   VLDL 41.0 (H) 05/18/2016 1526   LDLCALC 62 09/11/2015 0421    CBC    Component Value Date/Time   WBC 7.0 05/18/2016 1526   RBC 4.58 05/18/2016 1526   HGB 13.2 05/18/2016  1526   HGB 14.0 11/12/2011 1136   HCT 38.9 05/18/2016 1526   HCT 40.7 11/12/2011 1136   PLT 254.0 05/18/2016 1526   PLT 274 11/12/2011 1136   MCV 85.0 05/18/2016 1526   MCV 86 11/12/2011 1136   MCH 28.3 09/10/2015 1751   MCHC 34.0 05/18/2016 1526   RDW 12.8 05/18/2016 1526   RDW 12.8 11/12/2011 1136   LYMPHSABS 1.7 07/21/2014 1340   LYMPHSABS 2.5 11/12/2011 1136   MONOABS 0.7 07/21/2014 1340   MONOABS 0.8 11/12/2011 1136   EOSABS 0.3 07/21/2014 1340   EOSABS 0.6 11/12/2011 1136   BASOSABS 0.0 07/21/2014 1340   BASOSABS 0.1 11/12/2011 1136    Hgb A1C Lab Results  Component Value Date   HGBA1C 6.0 05/18/2016            Assessment & Plan:   Cervical Radiculitis:  Will trial a Prednisone Taper x 9 days Hold Meloxicam while on Prednisone Will get MR cervical spine if pain persist or worsens  RTC as needed or if symptoms persist or worsen BAITY, REGINA, NP

## 2016-09-13 NOTE — Telephone Encounter (Signed)
New Message    (Pt has appt for 4/13 2:40pm with Dr Marlou Porch , refused earlier appts with Pa)   Pt states she has been having pain in left arm but thinks it is her back injury causing it.  Pt also states that when she is doing physical activity, it does not feel like her heart can keep up with the physical activity.

## 2016-09-14 NOTE — Telephone Encounter (Signed)
Pt states she was seen and given prednisone which since to be helping.  She will c/b if concerns and f/u as scheduled.

## 2016-09-20 DIAGNOSIS — E1142 Type 2 diabetes mellitus with diabetic polyneuropathy: Secondary | ICD-10-CM | POA: Diagnosis not present

## 2016-09-20 DIAGNOSIS — L03032 Cellulitis of left toe: Secondary | ICD-10-CM | POA: Diagnosis not present

## 2016-09-27 ENCOUNTER — Encounter: Payer: Self-pay | Admitting: *Deleted

## 2016-10-12 ENCOUNTER — Encounter: Payer: Self-pay | Admitting: Cardiology

## 2016-10-12 ENCOUNTER — Ambulatory Visit (INDEPENDENT_AMBULATORY_CARE_PROVIDER_SITE_OTHER): Payer: Medicare Other | Admitting: Cardiology

## 2016-10-12 VITALS — BP 138/80 | HR 69 | Ht 70.0 in | Wt 233.0 lb

## 2016-10-12 DIAGNOSIS — E7800 Pure hypercholesterolemia, unspecified: Secondary | ICD-10-CM | POA: Diagnosis not present

## 2016-10-12 DIAGNOSIS — I2510 Atherosclerotic heart disease of native coronary artery without angina pectoris: Secondary | ICD-10-CM | POA: Diagnosis not present

## 2016-10-12 DIAGNOSIS — I2583 Coronary atherosclerosis due to lipid rich plaque: Secondary | ICD-10-CM | POA: Diagnosis not present

## 2016-10-12 DIAGNOSIS — I6523 Occlusion and stenosis of bilateral carotid arteries: Secondary | ICD-10-CM | POA: Diagnosis not present

## 2016-10-12 NOTE — Progress Notes (Signed)
[facility]. [address], [address] [place], [place]  [address] Phone: [phone] Fax:  [phone]  Date:  10/12/2016   ID:  Melinda [name], DOB [birth year], MRN [id number]  PCP:  [name], NP   History of Present Illness: Melinda [name] is a 61 y.o. 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 Cardiac catheterization 2012 revealed patent LAD stent.   She underwent a nuclear stress test 2014 which demonstrated mild anterolateral ischemia with normal ejection fraction. This was a very similar finding asked to prior stress test in August of 2012 which resulted in catheterization in August of 2012 which showed patent LAD stent, DES. There was a diagonal branch jailed by stent with minor luminal irregularity at the ostium. EF was normal. Because of these symptoms, I initiated metoprolol extender release 25 mg to see if this would help.  She felt crummy. Fatigue. Unsure if it's her chronic fatigue, fibromyalgia. We decided to initiate isosorbide. She did feel some head rushing sensation she was bending over or wiping her dogs paws. Her heart rate also has not been increasing as well as usual. She also feels more fatigued.   On 10/09/12 I decided to discontinue her metoprolol 25 mg. On Nov 14, 2012 she is here for followup after discontinuation of beta blocker.  02/18/13 - Doing well. Rare palpitation off metop. Taking [name] class. Enjoy.  03/02/14 - Hospitalized with strep infection. Sepsis.  Echocardiogram reassuring. Infectious disease consultation. Much improved. Fever was as high as 105 she says.   Trying to avoid metformin.A1c 6.9. Lost weight. Eating well. Exercising.   06/09/14-had an episode of chest discomfort that was concerning to her. Prompted this office visit today. She had an episode of pain was 6/10, mid low sternum,  tried to stretch, sat down, pain started to lessen took one nitroglycerin with relief, no nausea or vomiting diaphoresis shortness of breath. She noted some pain in the right side of her neck when she bent over. Pounding in right side of neck. Felt like she did prior to stent.  She had another milder episode of chest pain previous evening. Nitroglycerin improved. She was concerned about the sore spot on her neck. It seems to hurt to touch but it is not enlarged.Walking loosing weight. Not under stress.   03/04/15 - Had cervical spine surgery. Left hand first 3 fingers sometimes have tingling or numbness. Question carpal tunnel as well. No chest pain, no shortness of breath.  Wearing knee brace. Unstable. Right sciatica. Rare palps. Usually last seconds in duration. Stress can sometimes trigger. Overall no chest pain, no fevers, no chills, no shortness of breath.  10/12/16 - feels like heart cant keep up with her. She feels like when she tries to exercise vigorously that she was to go further but her heart won't let her. No chest pain.  She is also had the sensation of near syncope where her vision may darken, ears ring. When she was at the previous doctor's visit her heart pressure was in the low 100s. She has been on her anti-spasmodics for quite some time.   Wt Readings from Last 3 Encounters:  10/12/16 233 lb (105.7 kg)  09/13/16 234 lb (106.1 kg)  09/06/16 231 lb (104.8 kg)     Past Medical History:  Diagnosis Date  . Anemia  years ago  . Bulging disc   . CAD (coronary artery disease) 2009   s/p stent to LAD  . 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 Chicken pox   . Coronary arteritis   . Degenerative disc disease   . Diabetes mellitus without complication ([facility])   . Facet joint disease   . Fibromyalgia   . Heart disease   . Hyperlipidemia   . MCL deficiency, knee   . MRSA (methicillin resistant staph aureus) culture positive 2011   GREAT TOE RIGHT FOOT  .  Neuropathy ([place]) 04/18/2010  . Peripheral neuropathy ([facility])   . Sleep apnea    uses CPAP, sleep study at [facility]  . Spinal stenosis     Past Surgical History:  Procedure Laterality Date  . ABLATION     UTERUS  . ABLATION     HEART  . ANTERIOR CERVICAL DECOMP/DISCECTOMY FUSION N/A 07/28/2014   Procedure: ANTERIOR CERVICAL DECOMPRESSION/DISCECTOMY FUSION CERVICAL 3-4,4-5,5-6 LEVELS WITH INSTRUMENTATION AND ALLOGRAFT;  Surgeon: Sinclair Ship, MD;  Location: [facility];  Service: Orthopedics;  Laterality: N/A;  Anterior cervical decompression fusion, cervical 3-4, cervical 4-5, cervical 5-6 with instrumentation and allograft  . BACK SURGERY     X 3 1979, 1994, 1995  . CHOLECYSTECTOMY  2003  . EYE SURGERY Bilateral   . FOOT SURGERY Right    BIG TOE  . FOOT SURGERY Bilateral    PLANTAR FASCIITIS  . FOOT SURGERY Right    2ND TOE  . FRACTURE SURGERY Right 2012   carpal tunnel  . GALLBLADDER SURGERY    . HAND SURGERY Right   . HAND SURGEY Left   . HEART STENT  2009   LAD  . INFUSION PUMP IMPLANTATION     X2 with morphine and baclofen    Current Outpatient Prescriptions  Medication Sig Dispense Refill  . ACCU-CHEK AVIVA PLUS test strip TEST 2 TIMES DAY AS NEEDED **E11.9** 100 each 6  . ACCU-CHEK SOFTCLIX LANCETS lancets 1 each by Other route 2 (two) times daily. Use as instructed 100 each 2  . AMBULATORY NON FORMULARY MEDICATION Medication Name: CPAP MASK OF CHOICE FOR HOME DEVICE 1 each 0  . aspirin 81 MG tablet Take 81 mg by mouth daily.    Marland Kitchen buPROPion (WELLBUTRIN XL) 150 MG 24 hr tablet TAKE 1 TABLET BY MOUTH DAILY 90 tablet 1  . Cholecalciferol (VITAMIN D3) 2000 units TABS Take 2,000 Units by mouth daily.    . Chromium Picolinate 800 MCG TABS Take 800 mcg by mouth daily.    . Coenzyme Q10 (CO Q 10) 100 MG CAPS Take 200 Doses by mouth.    . Ginger, Zingiber officinalis, (GINGER ROOT PO) Take 1.1 g by mouth daily.    Marland Kitchen glucosamine-chondroitin 500-400 MG tablet Take  1,500 tablets by mouth 3 (three) times daily.    Marland Kitchen glucose blood test strip 1 each by Other route. Use as instructed    . MAGNESIUM MALATE PO Take 115 mg by mouth daily.    . meloxicam (MOBIC) 15 MG tablet Take 1 tablet (15 mg total) by mouth daily. 90 tablet 0  . Multiple Vitamin (MULTIVITAMIN) tablet Take 1 tablet by mouth daily.    . nitroGLYCERIN (NITROSTAT) 0.4 MG SL tablet Place 1 tablet (0.4 mg total) under the tongue every 5 (five) minutes as needed for chest pain. 25 tablet prn  . Nutritional Supplements (GRAPESEED EXTRACT PO) Take 400 mg by mouth daily.    Marland Kitchen  Omega-3 Fatty Acids (FISH OIL) 1200 MG CAPS Take by mouth.    . oxyCODONE (OXY IR/ROXICODONE) 5 MG immediate release tablet Take 1 tablet (5 mg total) by mouth 3 (three) times daily. 90 tablet 0  . oxyCODONE (OXY IR/ROXICODONE) 5 MG immediate release tablet Take 1 tablet (5 mg total) by mouth every 8 (eight) hours as needed for severe pain. 90 tablet 0  . oxyCODONE (OXY IR/ROXICODONE) 5 MG immediate release tablet Take 1 tablet (5 mg total) by mouth every 8 (eight) hours as needed for severe pain. 90 tablet 0  . oxyCODONE (OXY IR/ROXICODONE) 5 MG immediate release tablet Take 1 tablet (5 mg total) by mouth every 8 (eight) hours as needed for severe pain. 90 tablet 0  . PAIN MANAGEMENT IT PUMP REFILL 1 each by Intrathecal route once. Medication: PF Fentanyl 500.0 mcg/ml PF Baclofen 300.75mcg/ml PF Bupivicaine 20.0 mg/ml Total Volume: 40 ml Needed by 08-08-16 @ 1000 1 each 0  . PARoxetine (PAXIL) 20 MG tablet TAKE ONE-HALF OF A TABLET BY MOUTH TWICEDAILY 90 tablet 2  . polyethylene glycol (MIRALAX / GLYCOLAX) packet Take 17 g by mouth daily.    . predniSONE (DELTASONE) 10 MG tablet Take 3 tabs on days 1-3, take 2 tabs on days 4-6, take 1 tab on days 7-9 18 tablet 0  . pregabalin (LYRICA) 100 MG capsule Take 1 capsule (100 mg total) by mouth 3 (three) times daily. 270 capsule 0  . rosuvastatin (CRESTOR) 20 MG tablet Take 1 tablet (20 mg  total) by mouth daily. 90 tablet 3  . sulfamethoxazole-trimethoprim (BACTRIM DS,SEPTRA DS) 800-160 MG tablet Take 1 tablet by mouth 2 (two) times daily. 20 tablet 0  . tiZANidine (ZANAFLEX) 4 MG capsule Take 1 capsule (4 mg total) by mouth 3 (three) times daily. 270 capsule 0  . vitamin E 400 UNIT capsule Take 400 Units by mouth daily.     No current facility-administered medications for this visit.     Allergies:   No Known Allergies  Social History:  The patient  reports that she quit smoking about 9 years ago. Her smoking use included Cigarettes. She has a 48.00 pack-year smoking history. She has never used smokeless tobacco. She reports that she drinks alcohol. She reports that she does not use drugs.   Family History  Problem Relation Age of Onset  . Lung cancer Mother   . Diabetes Father   . Heart disease Maternal Grandfather   . Diabetes Paternal Grandmother   . Colon cancer Paternal Grandfather   . Stroke Neg Hx     ROS:  Please see the history of present illness.   Denies any fevers, chills, orthopnea, PND, strokelike symptoms, shortness of breath   All other systems reviewed and negative.   PHYSICAL EXAM: VS:  BP 138/80   Pulse 69   Ht 5\' 10"  (1.778 m)   Wt 233 lb (105.7 kg)   SpO2 94%   BMI 33.43 kg/m  Well nourished, well developed, in no acute distress  HEENT: normal, [place]/AT, EOMI Neck: no JVD, normal carotid upstroke,Very soft bilateral carotid bruit Cardiac:  normal S1, S2; regular rate and rhythm; no murmur  Lungs:  Clear to auscultation bilaterally, no wheezing, rhonchi or rales  Abd: soft, nontender, no hepatomegaly, no bruits  Ext: No edema, 2+ distal pulses Skin: warm and dry  GU: deferred Neuro: no focal abnormalities noted, AAO x 3  EKG:  10/12/16-sinus rhythm with no other abnormalities. Personally viewed-prior 06/09/14-sinus rhythm, 71,  no other ST segment changes.  ASSESSMENT AND PLAN:  1. Carotid artery disease-bilateral 50% stenosis. Moderate. We  will continue to monitor. Dopplers performed in December 2016 approximate 50% bilateral carotid artery stenosis. Asymptomatic. I offered her vascular referral previously but she would like to wait at this point. No changes. 2. Coronary artery disease-stable. EKG previously unremarkable. Low-dose isosorbide. I will go ahead and stop because of her low blood pressure. Issues with orthostatic hypotension. She has been off of beta blocker for quite some time secondary to fatigue. She is doing well today. Blood pressure too low to advance any other antianginal medications. Continue with adequate hydration. 3. Angina-well-controlled. She does have atypical sharp stabbing chest pains at times. Noncardiac. EKG reassuring. 4. Obesity-doing well with weight loss.  She is trying to avoid medication for diabetes. Her prior hemoglobin A1c was 6.9. She is striving for diet control. No changes made implant. She is a member of the [employer] 5. Hyperlipidemia-currently on Crestor. Doing well. No myalgias. LDL less than 70. Goal. No changes. Stable no changes above 6. Orthostatic hypotension - reviewed [name], NP note. ? From pain meds. Agree with pushing fluids. Salt liberalization. No changes.  She will call us if stopping the isosorbide causes any changes. Will see in follow up 12 months  Signed, Candee Furbish, MD [facility]  10/12/2016 5:35 PM

## 2016-10-12 NOTE — Patient Instructions (Addendum)
Medication Instructions:  Please stop your isosorbide. Continue all other medications as listed.  Please drink plenty of fluids and consume adequate sodium.  Follow-Up: Follow up in 1 year with Dr. Marlou Porch.  You will receive a letter in the mail 2 months before you are due.  Please call us when you receive this letter to schedule your follow up appointment.  If you need a refill on your cardiac medications before your next appointment, please call your pharmacy.  Thank you for choosing [facility]!!

## 2016-10-16 NOTE — Addendum Note (Signed)
Addended by: Shellia Cleverly on: 10/16/2016 11:39 AM   Modules accepted: Orders

## 2016-10-22 ENCOUNTER — Ambulatory Visit (INDEPENDENT_AMBULATORY_CARE_PROVIDER_SITE_OTHER): Payer: Medicare Other | Admitting: Family Medicine

## 2016-10-22 ENCOUNTER — Encounter: Payer: Self-pay | Admitting: Family Medicine

## 2016-10-22 ENCOUNTER — Ambulatory Visit: Payer: Medicare Other | Admitting: Family Medicine

## 2016-10-22 VITALS — BP 152/76 | HR 96 | Temp 102.7°F | Wt 231.8 lb

## 2016-10-22 DIAGNOSIS — R509 Fever, unspecified: Secondary | ICD-10-CM

## 2016-10-22 DIAGNOSIS — I6523 Occlusion and stenosis of bilateral carotid arteries: Secondary | ICD-10-CM | POA: Diagnosis not present

## 2016-10-22 DIAGNOSIS — B349 Viral infection, unspecified: Secondary | ICD-10-CM | POA: Diagnosis not present

## 2016-10-22 LAB — POCT INFLUENZA A/B
Influenza A, POC: NEGATIVE
Influenza B, POC: NEGATIVE

## 2016-10-22 MED ORDER — ONDANSETRON HCL 8 MG PO TABS: 8.0000 mg | ORAL_TABLET | Freq: Three times a day (TID) | ORAL | 0 refills | Status: DC | PRN

## 2016-10-22 NOTE — Progress Notes (Signed)
   Subjective:    Patient ID: Melinda [name], female    DOB: [birth year], 61 y.o.   MRN: [id number]  HPI Here for the sudden onset this am of fever and chills, headache, nausea without vomiting, and body aches. No ST or cough. BMs are normal. No urinary symptoms. No recent travel.    Review of Systems  Constitutional: Positive for chills, diaphoresis and fever.  HENT: Negative.   Eyes: Negative.   Respiratory: Negative.   Cardiovascular: Negative.   Gastrointestinal: Positive for nausea. Negative for abdominal distention, abdominal pain, anal bleeding, blood in stool, constipation, diarrhea, rectal pain and vomiting.  Genitourinary: Negative.   Musculoskeletal: Positive for myalgias.  Skin: Negative for rash.  Neurological: Positive for headaches.       Objective:   Physical Exam  Constitutional: She is oriented to person, place, and time. She appears well-developed and well-nourished.  HENT:  Right Ear: External ear normal.  Left Ear: External ear normal.  Nose: Nose normal.  Mouth/Throat: Oropharynx is clear and moist. No oropharyngeal exudate.  Eyes: Conjunctivae are normal.  Neck: No thyromegaly present.  Tender shotty AC nodes   Cardiovascular: Normal rate, regular rhythm, normal heart sounds and intact distal pulses.   Pulmonary/Chest: Effort normal and breath sounds normal. No respiratory distress. She has no wheezes. She has no rales.  Abdominal: Soft. Bowel sounds are normal. She exhibits no distension and no mass. There is no tenderness. There is no rebound and no guarding.  Neurological: She is alert and oriented to person, place, and time.          Assessment & Plan:  Viral illness. Use Zofran for nausea. Drink plenty of fluids. She is already taking Oxycodone and Meloxicam, so for body aches and fever she can add 1000 mg of Tylenol every 8 hours prn.  Alysia Penna, MD

## 2016-11-01 ENCOUNTER — Encounter: Payer: Self-pay | Admitting: Pain Medicine

## 2016-11-01 ENCOUNTER — Ambulatory Visit: Payer: Medicare Other | Attending: Pain Medicine | Admitting: Pain Medicine

## 2016-11-01 VITALS — BP 126/70 | HR 84 | Temp 99.2°F | Resp 18 | Ht 70.0 in | Wt 227.0 lb

## 2016-11-01 DIAGNOSIS — M5136 Other intervertebral disc degeneration, lumbar region: Secondary | ICD-10-CM | POA: Insufficient documentation

## 2016-11-01 DIAGNOSIS — M25561 Pain in right knee: Secondary | ICD-10-CM

## 2016-11-01 DIAGNOSIS — Z8614 Personal history of Methicillin resistant Staphylococcus aureus infection: Secondary | ICD-10-CM | POA: Diagnosis not present

## 2016-11-01 DIAGNOSIS — F329 Major depressive disorder, single episode, unspecified: Secondary | ICD-10-CM | POA: Insufficient documentation

## 2016-11-01 DIAGNOSIS — Z9049 Acquired absence of other specified parts of digestive tract: Secondary | ICD-10-CM | POA: Diagnosis not present

## 2016-11-01 DIAGNOSIS — G96198 Other disorders of meninges, not elsewhere classified: Secondary | ICD-10-CM

## 2016-11-01 DIAGNOSIS — M791 Myalgia: Secondary | ICD-10-CM

## 2016-11-01 DIAGNOSIS — Z7982 Long term (current) use of aspirin: Secondary | ICD-10-CM | POA: Insufficient documentation

## 2016-11-01 DIAGNOSIS — I2510 Atherosclerotic heart disease of native coronary artery without angina pectoris: Secondary | ICD-10-CM | POA: Diagnosis not present

## 2016-11-01 DIAGNOSIS — M961 Postlaminectomy syndrome, not elsewhere classified: Secondary | ICD-10-CM

## 2016-11-01 DIAGNOSIS — Z79891 Long term (current) use of opiate analgesic: Secondary | ICD-10-CM | POA: Diagnosis not present

## 2016-11-01 DIAGNOSIS — G894 Chronic pain syndrome: Secondary | ICD-10-CM | POA: Diagnosis not present

## 2016-11-01 DIAGNOSIS — E785 Hyperlipidemia, unspecified: Secondary | ICD-10-CM | POA: Insufficient documentation

## 2016-11-01 DIAGNOSIS — E7800 Pure hypercholesterolemia, unspecified: Secondary | ICD-10-CM | POA: Insufficient documentation

## 2016-11-01 DIAGNOSIS — M542 Cervicalgia: Secondary | ICD-10-CM | POA: Insufficient documentation

## 2016-11-01 DIAGNOSIS — E669 Obesity, unspecified: Secondary | ICD-10-CM | POA: Insufficient documentation

## 2016-11-01 DIAGNOSIS — F1190 Opioid use, unspecified, uncomplicated: Secondary | ICD-10-CM

## 2016-11-01 DIAGNOSIS — K5903 Drug induced constipation: Secondary | ICD-10-CM | POA: Insufficient documentation

## 2016-11-01 DIAGNOSIS — M545 Low back pain: Secondary | ICD-10-CM | POA: Insufficient documentation

## 2016-11-01 DIAGNOSIS — M797 Fibromyalgia: Secondary | ICD-10-CM | POA: Diagnosis not present

## 2016-11-01 DIAGNOSIS — M170 Bilateral primary osteoarthritis of knee: Secondary | ICD-10-CM | POA: Insufficient documentation

## 2016-11-01 DIAGNOSIS — G8929 Other chronic pain: Secondary | ICD-10-CM

## 2016-11-01 DIAGNOSIS — M8949 Other hypertrophic osteoarthropathy, multiple sites: Secondary | ICD-10-CM

## 2016-11-01 DIAGNOSIS — M7918 Myalgia, other site: Secondary | ICD-10-CM

## 2016-11-01 DIAGNOSIS — M1991 Primary osteoarthritis, unspecified site: Secondary | ICD-10-CM | POA: Diagnosis not present

## 2016-11-01 DIAGNOSIS — D649 Anemia, unspecified: Secondary | ICD-10-CM | POA: Diagnosis not present

## 2016-11-01 DIAGNOSIS — G9619 Other disorders of meninges, not elsewhere classified: Secondary | ICD-10-CM

## 2016-11-01 DIAGNOSIS — M4186 Other forms of scoliosis, lumbar region: Secondary | ICD-10-CM | POA: Diagnosis not present

## 2016-11-01 DIAGNOSIS — M150 Primary generalized (osteo)arthritis: Secondary | ICD-10-CM

## 2016-11-01 DIAGNOSIS — M5441 Lumbago with sciatica, right side: Secondary | ICD-10-CM

## 2016-11-01 DIAGNOSIS — Z6839 Body mass index (BMI) 39.0-39.9, adult: Secondary | ICD-10-CM | POA: Insufficient documentation

## 2016-11-01 DIAGNOSIS — M47812 Spondylosis without myelopathy or radiculopathy, cervical region: Secondary | ICD-10-CM | POA: Diagnosis not present

## 2016-11-01 DIAGNOSIS — E119 Type 2 diabetes mellitus without complications: Secondary | ICD-10-CM | POA: Diagnosis not present

## 2016-11-01 DIAGNOSIS — M159 Polyosteoarthritis, unspecified: Secondary | ICD-10-CM

## 2016-11-01 DIAGNOSIS — Z87891 Personal history of nicotine dependence: Secondary | ICD-10-CM | POA: Insufficient documentation

## 2016-11-01 MED ORDER — TIZANIDINE HCL 4 MG PO CAPS: 4.0000 mg | ORAL_CAPSULE | Freq: Three times a day (TID) | ORAL | 0 refills | Status: DC

## 2016-11-01 MED ORDER — OXYCODONE HCL 5 MG PO TABS: 5.0000 mg | ORAL_TABLET | Freq: Three times a day (TID) | ORAL | 0 refills | Status: DC | PRN

## 2016-11-01 MED ORDER — MELOXICAM 15 MG PO TABS: 15.0000 mg | ORAL_TABLET | Freq: Every day | ORAL | 0 refills | Status: DC

## 2016-11-01 MED ORDER — PREGABALIN 100 MG PO CAPS: 100.0000 mg | ORAL_CAPSULE | Freq: Three times a day (TID) | ORAL | 0 refills | Status: DC

## 2016-11-01 NOTE — Progress Notes (Signed)
Nursing Pain Medication Assessment:  Safety precautions to be maintained throughout the outpatient stay will include: orient to surroundings, keep bed in low position, maintain call bell within reach at all times, provide assistance with transfer out of bed and ambulation.  Medication Inspection Compliance: Pill count conducted under aseptic conditions, in front of the patient. Neither the pills nor the bottle was removed from the patient's sight at any time. Once count was completed pills were immediately returned to the patient in their original bottle.  Medication: Oxycodone IR Pill/Patch Count: 96 of 90 pills remain Pill/Patch Appearance: Markings consistent with prescribed medication Bottle Appearance: Standard pharmacy container. Clearly labeled. Filled Date:04/27/ 2018 Last Medication intake:  Today   Also using Hemp extract paste sublingual bid

## 2016-11-01 NOTE — Progress Notes (Signed)
Patient's Name: Melinda [name]  MRN: [id number]  Referring Provider: Jearld Fenton, NP  DOB: 1955-11-09  PCP: Jearld Fenton, NP  DOS: 11/01/2016  Note by: Kathlen Brunswick. Dossie Arbour, MD  Service setting: Ambulatory outpatient  Specialty: Interventional Pain Management  Location: [facility] (AMB) Pain Management Facility    Patient type: Established   Primary Reason(s) for Visit: Encounter for prescription drug management (Level of risk: moderate) CC: Knee Pain (right) and Back Pain (left, lower)  HPI  Melinda [name] is a 61 y.o. year old, female patient, who comes today for a medication management evaluation. She has Chronic pain syndrome; CAD (coronary artery disease); Obesity (BMI 30-39.9); Pure hypercholesterolemia; DM2 (diabetes mellitus, type 2) ([place]); Great toe amputation status ([place]); Failed back surgical syndrome; Failed cervical surgery syndrome (ACDF); Chronic neck pain; Chronic low back pain (Bilateral) (L>R); Epidural fibrosis; Cervical spondylosis; Neuropathic pain; Fibromyalgia; Presence of functional implant (Medtronic programmable intrathecal pump) (Right abdominal area); Presence of intrathecal pump (Medtronic intrathecal programmable pump) (40 mL pump); Opioid-induced constipation (OIC); Chronic knee pain (Location of Primary Source of Pain) (Right); Osteoarthritis of knee (Left); Depression; Long term current use of opiate analgesic; Long term prescription opiate use; Opiate use; Encounter for adjustment or management of infusion pump; Osteoarthritis, multiple sites; Musculoskeletal pain; Osteoarthritis of knee (Bilateral) (R>L); and Paronychia of great toe of left foot on her problem list. Her primarily concern today is the Knee Pain (right) and Back Pain (left, lower)  Pain Assessment: Self-Reported Pain Score: 4 /10 Clinically the patient looks like a 2/10 Reported level is inconsistent with clinical observations. Information on the proper use of the pain scale provided to the patient  today Pain Type: Chronic pain Pain Location: Back Pain Orientation: Lower, Left Pain Descriptors / Indicators: Sharp, Dull, Throbbing (has "thudding" pain in right buttock with standing or walking or bending. Also has numbness in left thumb) Pain Frequency: Intermittent  Melinda [name] was last scheduled for an appointment on 08/08/2016 for medication management. During today's appointment we reviewed Melinda [name]'s chronic pain status, as well as her outpatient medication regimen.  The patient  reports that she does not use drugs. Her body mass index is 32.57 kg/m.  Further details on both, my assessment(s), as well as the proposed treatment plan, please see below.  Controlled Substance Pharmacotherapy Assessment REMS (Risk Evaluation and Mitigation Strategy)  Analgesic:Oxycodone IR 5 mg every 8 hours (15 mg/day), in addition to the intrathecal pump medication. MME/day:22 mg/day of oral medication.   Landis Martins, RN  11/01/2016 10:43 AM  Sign at close encounter Nursing Pain Medication Assessment:  Safety precautions to be maintained throughout the outpatient stay will include: orient to surroundings, keep bed in low position, maintain call bell within reach at all times, provide assistance with transfer out of bed and ambulation.  Medication Inspection Compliance: Pill count conducted under aseptic conditions, in front of the patient. Neither the pills nor the bottle was removed from the patient's sight at any time. Once count was completed pills were immediately returned to the patient in their original bottle.  Medication: Oxycodone IR Pill/Patch Count: 96 of 90 pills remain Pill/Patch Appearance: Markings consistent with prescribed medication Bottle Appearance: Standard pharmacy container. Clearly labeled. Filled Date:04/27/ 2018 Last Medication intake:  Today   Also using Hemp extract paste sublingual bid   Pharmacokinetics: Liberation and absorption (onset of action):  WNL Distribution (time to peak effect): WNL Metabolism and excretion (duration of action): WNL  Pharmacodynamics: Desired effects: Analgesia: Melinda [name] reports >50% benefit. Functional ability: Patient reports that medication allows her to accomplish basic ADLs Clinically meaningful improvement in function (CMIF): Sustained CMIF goals met Perceived effectiveness: Described as relatively effective, allowing for increase in activities of daily living (ADL) Undesirable effects: Side-effects or Adverse reactions: None reported Monitoring: [place] PMP: Online review of the past 12-month period conducted. Compliant with practice rules and regulations List of all UDS test(s) done:  Lab Results  Component Value Date   TOXASSSELUR FINAL 06/11/2016   TOXASSSELUR FINAL 10/18/2015   TOXASSSELUR FINAL 07/27/2015   Last UDS on record: Tox[employer]ure Select 13  Date Value Ref Range Status  06/11/2016 FINAL  Final    Comment:    ==================================================================== TOXASSURE SELECT 13 (MW) ==================================================================== Test                             Result       Flag       Units Drug Present and Declared for Prescription Verification   Oxycodone                      1626         EXPECTED   ng/mg creat   Oxymorphone                    262          EXPECTED   ng/mg creat   Noroxycodone                   3199         EXPECTED   ng/mg creat    Sources of oxycodone include scheduled prescription medications.    Oxymorphone and noroxycodone are expected metabolites of    oxycodone. Oxymorphone is also available as a scheduled    prescription medication.   Fentanyl                       14           EXPECTED   ng/mg creat   Norfentanyl                    34           EXPECTED   ng/mg creat    Source of fentanyl is a scheduled prescription medication,    including IV, patch, and transmucosal formulations. Norfentanyl    is an  expected metabolite of fentanyl. ==================================================================== Test                      Result    Flag   Units      Ref Range   Creatinine              190              mg/dL      >=20 ==================================================================== Declared Medications:  The flagging and interpretation on this report are based on the  following declared medications.  Unexpected results may arise from  inaccuracies in the declared medications.  **Note: The testing scope of this panel includes these medications:  Fentanyl  Oxycodone  **Note: The testing scope of this panel does not include following  reported medications:  Aspirin (Aspirin 81)  Bupropion  Cholecalciferol  Chondroitin (Glucosamine-Chondroitin)  Glucosamine (Glucosamine-Chondroitin)  Isosorbide (Isordil)  Magnesium  Meloxicam (Mobic)  Multivitamin    Nitroglycerin (Nitrostat)  Omega-3 Fatty Acids (Fish Oil)  Paroxetine (Paxil)  Polyethylene Glycol  Pregabalin (Lyrica)  Rosuvastatin (Crestor)  Supplement  Tizanidine (Zanaflex)  Ubiquinone (CoQ10)  Vitamin E ==================================================================== For clinical consultation, please call [phone]. ====================================================================    UDS interpretation: Compliant          Medication Assessment Form: Reviewed. Patient indicates being compliant with therapy Treatment compliance: Compliant Risk Assessment Profile: Aberrant behavior: See prior evaluations. None observed or detected today Comorbid factors increasing risk of overdose: See prior notes. No additional risks detected today Risk of substance use disorder (SUD): Low Opioid Risk Tool (ORT) Total Score: 1  Interpretation Table:  Score <3 = Low Risk for SUD  Score between 4-7 = Moderate Risk for SUD  Score >8 = High Risk for Opioid Abuse   Risk Mitigation Strategies:  Patient Counseling:  Covered Patient-Prescriber Agreement (PPA): Present and active  Notification to other healthcare providers: Done  Pharmacologic Plan: No change in therapy, at this time  Laboratory Chemistry  Inflammation Markers Lab Results  Component Value Date   CRP 0.2 09/18/2010   ESRSEDRATE 16 09/18/2010   (CRP: Acute Phase) (ESR: Chronic Phase) Renal Function Markers Lab Results  Component Value Date   BUN 28 (H) 05/18/2016   CREATININE 1.09 05/18/2016   GFRAA >60 09/10/2015   GFRNONAA >60 09/10/2015   Hepatic Function Markers Lab Results  Component Value Date   AST 20 05/18/2016   ALT 18 05/18/2016   ALBUMIN 4.8 05/18/2016   ALKPHOS 86 05/18/2016   HCVAB NEGATIVE 05/18/2016   Electrolytes Lab Results  Component Value Date   NA 141 05/18/2016   K 4.4 05/18/2016   CL 104 05/18/2016   CALCIUM 9.6 05/18/2016   MG 2.1 06/30/2013   Neuropathy Markers Lab Results  Component Value Date   VITAMINB12 319 10/04/2013   Bone Pathology Markers Lab Results  Component Value Date   ALKPHOS 86 05/18/2016   CALCIUM 9.6 05/18/2016   Coagulation Parameters Lab Results  Component Value Date   INR 1.12 09/11/2015   LABPROT 14.6 09/11/2015   APTT 31 09/11/2015   PLT 254.0 05/18/2016   Cardiovascular Markers Lab Results  Component Value Date   HGB 13.2 05/18/2016   HCT 38.9 05/18/2016   Note: Lab results reviewed.  Recent Diagnostic Imaging Review  Dg Lumbar Spine Complete  Result Date: 05/18/2016 CLINICAL DATA:  Right side sciatica. EXAM: LUMBAR SPINE - COMPLETE 4+ VIEW COMPARISON:  None. FINDINGS: Mild rightward scoliosis. Diffuse degenerative disc and facet disease in the lumbar spine. No fracture or subluxation. SI joints are symmetric and unremarkable. Spinal stimulator device partially imaged. The tip of the wire is not visualized. IMPRESSION: Degenerative disc and facet disease. Rightward scoliosis. No acute findings. Electronically Signed   By: Rolm Baptise M.D.   On:  05/18/2016 17:12   Note: Imaging results reviewed.          Meds  The patient has a current medication list which includes the following prescription(s): accu-chek aviva plus, accu-chek softclix lancets, AMBULATORY NON FORMULARY MEDICATION, aspirin, bupropion, vitamin d3, chromium picolinate, co q 10, ginger (zingiber officinalis), glucosamine-chondroitin, glucose blood, magnesium malate, meloxicam, multivitamin, nitroglycerin, nutritional supplements, fish oil, oxycodone, oxycodone, oxycodone, paroxetine, polyethylene glycol, pregabalin, rosuvastatin, tizanidine, vitamin e, and PAIN MANAGEMENT IT PUMP REFILL.  Current Outpatient Prescriptions on File Prior to Visit  Medication Sig  . ACCU-CHEK AVIVA PLUS test strip TEST 2 TIMES DAY AS NEEDED **E11.9**  . ACCU-CHEK SOFTCLIX LANCETS lancets 1 each  by Other route 2 (two) times daily. Use as instructed  . AMBULATORY NON FORMULARY MEDICATION Medication Name: CPAP MASK OF CHOICE FOR HOME DEVICE  . aspirin 81 MG tablet Take 81 mg by mouth daily.  Marland Kitchen buPROPion (WELLBUTRIN XL) 150 MG 24 hr tablet TAKE 1 TABLET BY MOUTH DAILY  . Cholecalciferol (VITAMIN D3) 2000 units TABS Take 2,000 Units by mouth daily.  . Chromium Picolinate 800 MCG TABS Take 800 mcg by mouth daily.  . Coenzyme Q10 (CO Q 10) 100 MG CAPS Take 200 Doses by mouth.  . Ginger, Zingiber officinalis, (GINGER ROOT PO) Take 1.1 g by mouth daily.  Marland Kitchen glucosamine-chondroitin 500-400 MG tablet Take 1,500 tablets by mouth 3 (three) times daily.  Marland Kitchen glucose blood test strip 1 each by Other route. Use as instructed  . MAGNESIUM MALATE PO Take 115 mg by mouth daily.  . Multiple Vitamin (MULTIVITAMIN) tablet Take 1 tablet by mouth daily.  . nitroGLYCERIN (NITROSTAT) 0.4 MG SL tablet Place 1 tablet (0.4 mg total) under the tongue every 5 (five) minutes as needed for chest pain.  . Nutritional Supplements (GRAPESEED EXTRACT PO) Take 400 mg by mouth daily.  . Omega-3 Fatty Acids (FISH OIL) 1200 MG CAPS  Take by mouth.  Marland Kitchen PARoxetine (PAXIL) 20 MG tablet TAKE ONE-HALF OF A TABLET BY MOUTH TWICEDAILY  . polyethylene glycol (MIRALAX / GLYCOLAX) packet Take 17 g by mouth daily.  . rosuvastatin (CRESTOR) 20 MG tablet Take 1 tablet (20 mg total) by mouth daily.  . vitamin E 400 UNIT capsule Take 400 Units by mouth daily.  Marland Kitchen PAIN MANAGEMENT IT PUMP REFILL 1 each by Intrathecal route once. Medication: PF Fentanyl 500.0 mcg/ml PF Baclofen 300.84mg/ml PF Bupivicaine 20.0 mg/ml Total Volume: 40 ml Needed by 08-08-16 @ 1000   No current facility-administered medications on file prior to visit.    ROS  Constitutional: Denies any fever or chills Gastrointestinal: No reported hemesis, hematochezia, vomiting, or acute GI distress Musculoskeletal: Denies any acute onset joint swelling, redness, loss of ROM, or weakness Neurological: No reported episodes of acute onset apraxia, aphasia, dysarthria, agnosia, amnesia, paralysis, loss of coordination, or loss of consciousness  Allergies  Melinda [name] No Known Allergies.  [place] Drug: Melinda [name] reports that she does not use drugs. Alcohol:  reports that she drinks alcohol. Tobacco:  reports that she quit smoking about 9 years ago. Her smoking use included Cigarettes. She has a 48.00 pack-year smoking history. She has never used smokeless tobacco. Medical:  has a past medical history of Anemia; Bulging disc; CAD (coronary artery disease) (2009); Cardiac arrhythmia due to congenital heart disease; Chicken pox; Coronary arteritis; Degenerative disc disease; Diabetes mellitus without complication [place]; Facet joint disease [place]; Fibromyalgia; Heart disease; Hyperlipidemia; MCL deficiency, knee; MRSA (methicillin resistant staph aureus) culture positive (2011); Neuropathy (04/18/2010); Peripheral neuropathy; Sleep apnea; and Spinal stenosis. Family: family history includes Colon cancer in her paternal grandfather; Diabetes in her father and paternal  grandmother; Heart disease in her maternal grandfather; Lung cancer in her mother.  Past Surgical History:  Procedure Laterality Date  . ABLATION     UTERUS  . ABLATION     HEART  . ANTERIOR CERVICAL DECOMP/DISCECTOMY FUSION N/A 07/28/2014   Procedure: ANTERIOR CERVICAL DECOMPRESSION/DISCECTOMY FUSION CERVICAL 3-4,4-5,5-6 LEVELS WITH INSTRUMENTATION AND ALLOGRAFT;  Surgeon: MSinclair Ship MD;  [facility]  Service: Orthopedics;  Laterality: N/A;  Anterior cervical decompression fusion, cervical 3-4, cervical 4-5, cervical 5-6 with instrumentation and allograft  .  BACK SURGERY     X 3 1979, 1994, 1995  . CHOLECYSTECTOMY  2003  . EYE SURGERY Bilateral   . FOOT SURGERY Right    BIG TOE  . FOOT SURGERY Bilateral    PLANTAR FASCIITIS  . FOOT SURGERY Right    2ND TOE  . FRACTURE SURGERY Right 2012   carpal tunnel  . GALLBLADDER SURGERY    . HAND SURGERY Right   . HAND SURGEY Left   . HEART STENT  2009   LAD  . INFUSION PUMP IMPLANTATION     X2 with morphine and baclofen   Constitutional Exam  General appearance: Well nourished, well developed, and well hydrated. In no apparent acute distress Vitals:   11/01/16 1033  BP: 126/70  Pulse: 84  Resp: 18  Temp: 99.2 F (37.3 C)  TempSrc: Oral  SpO2: 99%  Weight: 227 lb (103 kg)  Height: 5' 10" (1.778 m)   BMI Assessment: Estimated body mass index is 32.57 kg/m as calculated from the following:   Height as of this encounter: 5' 10" (1.778 m).   Weight as of this encounter: 227 lb (103 kg).  BMI interpretation table: BMI level Category Range association with higher incidence of chronic pain  <18 kg/m2 Underweight   18.5-24.9 kg/m2 Ideal body weight   25-29.9 kg/m2 Overweight Increased incidence by 20%  30-34.9 kg/m2 Obese (Class I) Increased incidence by 68%  35-39.9 kg/m2 Severe obesity (Class II) Increased incidence by 136%  >40 kg/m2 Extreme obesity (Class III) Increased incidence by 254%   BMI Readings from  Last 4 Encounters:  11/01/16 32.57 kg/m  10/22/16 33.26 kg/m  10/12/16 33.43 kg/m  09/13/16 33.58 kg/m   Wt Readings from Last 4 Encounters:  11/01/16 227 lb (103 kg)  10/22/16 231 lb 12.8 oz (105.1 kg)  10/12/16 233 lb (105.7 kg)  09/13/16 234 lb (106.1 kg)  Psych/Mental status: Alert, oriented x 3 (person, place, & time)       Eyes: PERLA Respiratory: No evidence of acute respiratory distress  Cervical Spine Exam  Inspection: No masses, redness, or swelling Alignment: Symmetrical Functional ROM: Unrestricted ROM      Stability: No instability detected Muscle strength & Tone: Functionally intact Sensory: Unimpaired Palpation: No palpable anomalies              Upper Extremity (UE) Exam    Side: Right upper extremity  Side: Left upper extremity  Inspection: No masses, redness, swelling, or asymmetry. No contractures  Inspection: No masses, redness, swelling, or asymmetry. No contractures  Functional ROM: Unrestricted ROM          Functional ROM: Unrestricted ROM          Muscle strength & Tone: Functionally intact  Muscle strength & Tone: Functionally intact  Sensory: Unimpaired  Sensory: Unimpaired  Palpation: No palpable anomalies              Palpation: No palpable anomalies              Specialized Test(s): Deferred         Specialized Test(s): Deferred          Thoracic Spine Exam  Inspection: No masses, redness, or swelling Alignment: Symmetrical Functional ROM: Unrestricted ROM Stability: No instability detected Sensory: Unimpaired Muscle strength & Tone: No palpable anomalies  Lumbar Spine Exam  Inspection: No masses, redness, or swelling Alignment: Symmetrical Functional ROM: Unrestricted ROM      Stability: No instability detected Muscle strength & Tone:  Functionally intact Sensory: Unimpaired Palpation: No palpable anomalies       Provocative Tests: Lumbar Hyperextension and rotation test: evaluation deferred today       Patrick's Maneuver: evaluation  deferred today                    Gait & Posture Assessment  Ambulation: Unassisted Gait: Relatively normal for age and body habitus Posture: WNL   Lower Extremity Exam    Side: Right lower extremity  Side: Left lower extremity  Inspection: No masses, redness, swelling, or asymmetry. No contractures  Inspection: No masses, redness, swelling, or asymmetry. No contractures  Functional ROM: Unrestricted ROM          Functional ROM: Unrestricted ROM          Muscle strength & Tone: Functionally intact  Muscle strength & Tone: Functionally intact  Sensory: Unimpaired  Sensory: Unimpaired  Palpation: No palpable anomalies  Palpation: No palpable anomalies   Assessment  Primary Diagnosis & Pertinent Problem List: The primary encounter diagnosis was Chronic knee pain (Location of Primary Source of Pain) (Right). Diagnoses of Chronic low back pain (Bilateral) (L>R), Failed back surgical syndrome, Epidural fibrosis, Chronic pain syndrome, Long term prescription opiate use, Opiate use, Fibromyalgia, Musculoskeletal pain, and Primary osteoarthritis involving multiple joints were also pertinent to this visit.  Status Diagnosis  Controlled Controlled Controlled 1. Chronic knee pain (Location of Primary Source of Pain) (Right)   2. Chronic low back pain (Bilateral) (L>R)   3. Failed back surgical syndrome   4. Epidural fibrosis   5. Chronic pain syndrome   6. Long term prescription opiate use   7. Opiate use   8. Fibromyalgia   9. Musculoskeletal pain   10. Primary osteoarthritis involving multiple joints      Plan of Care  Pharmacotherapy (Medications Ordered): Meds ordered this encounter  Medications  . oxyCODONE (OXY IR/ROXICODONE) 5 MG immediate release tablet    Sig: Take 1 tablet (5 mg total) by mouth every 8 (eight) hours as needed for severe pain.    Dispense:  90 tablet    Refill:  0    Do not place this medication, or any other prescription from our practice, on "Automatic  Refill". Patient may have prescription filled one day early if pharmacy is closed on scheduled refill date. Do not fill until: 11/06/16 To last until: 12/06/16  . oxyCODONE (OXY IR/ROXICODONE) 5 MG immediate release tablet    Sig: Take 1 tablet (5 mg total) by mouth every 8 (eight) hours as needed for severe pain.    Dispense:  90 tablet    Refill:  0    Do not place this medication, or any other prescription from our practice, on "Automatic Refill". Patient may have prescription filled one day early if pharmacy is closed on scheduled refill date. Do not fill until: 12/06/16 To last until: 01/05/17  . oxyCODONE (OXY IR/ROXICODONE) 5 MG immediate release tablet    Sig: Take 1 tablet (5 mg total) by mouth every 8 (eight) hours as needed for severe pain.    Dispense:  90 tablet    Refill:  0    Do not place this medication, or any other prescription from our practice, on "Automatic Refill". Patient may have prescription filled one day early if pharmacy is closed on scheduled refill date. Do not fill until: 01/05/17 To last until: 02/04/17  . pregabalin (LYRICA) 100 MG capsule    Sig: Take 1 capsule (100 mg  total) by mouth 3 (three) times daily.    Dispense:  270 capsule    Refill:  0    Do not place this medication, or any other prescription from our practice, on "Automatic Refill". Patient may have prescription filled one day early if pharmacy is closed on scheduled refill date.  . tiZANidine (ZANAFLEX) 4 MG capsule    Sig: Take 1 capsule (4 mg total) by mouth 3 (three) times daily.    Dispense:  270 capsule    Refill:  0    Do not add this medication to the electronic "Automatic Refill" notification system. Patient may have prescription filled one day early if pharmacy is closed on scheduled refill date.  . meloxicam (MOBIC) 15 MG tablet    Sig: Take 1 tablet (15 mg total) by mouth daily.    Dispense:  90 tablet    Refill:  0    Do not place medication on "Automatic Refill". Fill one  day early if pharmacy is closed on scheduled refill date.   New Prescriptions   No medications on file   Medications administered today: Melinda [name] had no medications administered during this visit. Lab-work, procedure(s), and/or referral(s): No orders of the defined types were placed in this encounter.  Imaging and/or referral(s): None  Interventional therapies: Planned, scheduled, and/or pending:   Intrathecal pump refill.    Considering:   Diagnostic Right knee genicular nerve blocks  Possible right knee genicular RFA  Palliative cervical epidural steroid injection  Diagnostic cervical facet block  Possible bilateral cervical facet radiofrequency ablation    Palliative PRN treatment(s):   Cervical epidural steroid injection  Right diagnostic genicular nerve block    Provider-requested follow-up: Return in about 3 months (around 02/01/2017) for Med-Mgmt, by MD, keep scheduled appointment, Pump Refill (as per pump program).  Future Appointments Date Time Provider Department Center  12/11/2016 10:30 AM Francisco Naveira, MD [facility] None  01/29/2017 1:30 PM Francisco Naveira, MD [facility] None   Primary Care Physician: Regina W Baity, NP Location: [facility] Note by: Francisco A. Naveira, M.D, DABA, DABAPM, DABPM, DABIPP, FIPP Date: 11/01/2016; Time: 12:34 PM  Patient instructions provided during this appointment: Patient Instructions  Pain Management Discharge Instructions  General Discharge Instructions :  If you need to reach your doctor call: Monday-Friday 8:00 am - 4:00 pm at [phone] or toll free [phone].  After clinic hours [phone] to have operator reach doctor.  Bring all of your medication bottles to all your appointments in the pain clinic.  To cancel or reschedule your appointment with Pain Management please remember to call 24 hours in advance to avoid a fee.  Refer to the educational materials which you have  been given on: General Risks, I had my Procedure. Discharge Instructions, Post Sedation.  Post Procedure Instructions:  The drugs you were given will stay in your system until tomorrow, so for the next 24 hours you should not drive, make any legal decisions or drink any alcoholic beverages.  You may eat anything you prefer, but it is better to start with liquids then soups and crackers, and gradually work up to solid foods.  Please notify your doctor immediately if you have any unusual bleeding, trouble breathing or pain that is not related to your normal pain.  Depending on the type of procedure that was done, some parts of your body may feel week and/or numb.  This usually clears up by tonight or the next day.  Walk with the   use of an assistive device or accompanied by an adult for the 24 hours.  You may use ice on the affected area for the first 24 hours.  Put ice in a Ziploc bag and cover with a towel and place against area 15 minutes on 15 minutes off.  You may switch to heat after 24 hours.  

## 2016-11-01 NOTE — Patient Instructions (Signed)

## 2016-11-27 DIAGNOSIS — M2041 Other hammer toe(s) (acquired), right foot: Secondary | ICD-10-CM | POA: Diagnosis not present

## 2016-11-27 DIAGNOSIS — Z89421 Acquired absence of other right toe(s): Secondary | ICD-10-CM | POA: Diagnosis not present

## 2016-11-27 DIAGNOSIS — B351 Tinea unguium: Secondary | ICD-10-CM | POA: Diagnosis not present

## 2016-11-27 DIAGNOSIS — L97512 Non-pressure chronic ulcer of other part of right foot with fat layer exposed: Secondary | ICD-10-CM | POA: Diagnosis not present

## 2016-11-27 DIAGNOSIS — E1142 Type 2 diabetes mellitus with diabetic polyneuropathy: Secondary | ICD-10-CM | POA: Diagnosis not present

## 2016-11-30 ENCOUNTER — Other Ambulatory Visit: Payer: Self-pay

## 2016-11-30 MED ORDER — PAIN MANAGEMENT IT PUMP REFILL: 1.0000 | Freq: Once | INTRATHECAL | 0 refills | Status: DC

## 2016-12-07 ENCOUNTER — Other Ambulatory Visit: Payer: Self-pay | Admitting: Internal Medicine

## 2016-12-08 ENCOUNTER — Ambulatory Visit (INDEPENDENT_AMBULATORY_CARE_PROVIDER_SITE_OTHER): Payer: Medicare Other | Admitting: Family Medicine

## 2016-12-08 ENCOUNTER — Encounter: Payer: Self-pay | Admitting: Family Medicine

## 2016-12-08 VITALS — BP 102/62 | HR 75 | Temp 98.0°F | Wt 232.0 lb

## 2016-12-08 DIAGNOSIS — L237 Allergic contact dermatitis due to plants, except food: Secondary | ICD-10-CM

## 2016-12-08 DIAGNOSIS — I6523 Occlusion and stenosis of bilateral carotid arteries: Secondary | ICD-10-CM | POA: Diagnosis not present

## 2016-12-08 MED ORDER — HALOBETASOL PROPIONATE 0.05 % EX CREA: TOPICAL_CREAM | Freq: Two times a day (BID) | CUTANEOUS | 0 refills | Status: DC

## 2016-12-08 NOTE — Progress Notes (Signed)
Pre visit review using our clinic review tool, if applicable. No additional management support is needed unless otherwise documented below in the visit note. 

## 2016-12-08 NOTE — Progress Notes (Signed)
   Subjective:    Patient ID: SHALA BAUMBACH, female    DOB: 06/26/1956, 61 y.o.   MRN: [id number]  HPI Here for 3 days of itchy rash on the trunk, legs, and arms. She has reacted to poison ivy in the past. Using Caladryl OTC.    Review of Systems  Constitutional: Negative.   Respiratory: Negative.   Cardiovascular: Negative.   Skin: Positive for rash.       Objective:   Physical Exam  Constitutional: She appears well-developed and well-nourished.  Cardiovascular: Normal rate, regular rhythm, normal heart sounds and intact distal pulses.   Pulmonary/Chest: Effort normal and breath sounds normal. No respiratory distress. She has no wheezes. She has no rales.  Skin:  Widespread maculovesicular rash on the arms and legs, also there is a larger patch on the left flank           Assessment & Plan:  Contact dermatitis, treat with Halobetasol cream prn. Add benadryl prn. Alysia Penna, MD

## 2016-12-10 ENCOUNTER — Ambulatory Visit: Payer: Medicare Other | Admitting: Internal Medicine

## 2016-12-11 ENCOUNTER — Encounter: Payer: Self-pay | Admitting: Pain Medicine

## 2016-12-11 ENCOUNTER — Ambulatory Visit: Payer: Medicare Other | Attending: Pain Medicine | Admitting: Pain Medicine

## 2016-12-11 VITALS — BP 110/47 | HR 64 | Temp 96.3°F | Resp 18 | Ht 70.0 in | Wt 225.0 lb

## 2016-12-11 DIAGNOSIS — M25552 Pain in left hip: Secondary | ICD-10-CM | POA: Insufficient documentation

## 2016-12-11 DIAGNOSIS — M5441 Lumbago with sciatica, right side: Secondary | ICD-10-CM

## 2016-12-11 DIAGNOSIS — G8929 Other chronic pain: Secondary | ICD-10-CM

## 2016-12-11 DIAGNOSIS — G894 Chronic pain syndrome: Secondary | ICD-10-CM

## 2016-12-11 DIAGNOSIS — Z451 Encounter for adjustment and management of infusion pump: Secondary | ICD-10-CM | POA: Diagnosis not present

## 2016-12-11 DIAGNOSIS — Z969 Presence of functional implant, unspecified: Secondary | ICD-10-CM

## 2016-12-11 DIAGNOSIS — Z4689 Encounter for fitting and adjustment of other specified devices: Secondary | ICD-10-CM | POA: Insufficient documentation

## 2016-12-11 DIAGNOSIS — M545 Low back pain: Secondary | ICD-10-CM | POA: Insufficient documentation

## 2016-12-11 DIAGNOSIS — M25551 Pain in right hip: Secondary | ICD-10-CM | POA: Diagnosis not present

## 2016-12-11 DIAGNOSIS — M961 Postlaminectomy syndrome, not elsewhere classified: Secondary | ICD-10-CM | POA: Diagnosis not present

## 2016-12-11 DIAGNOSIS — Z79891 Long term (current) use of opiate analgesic: Secondary | ICD-10-CM

## 2016-12-11 MED ORDER — GLUCOSE BLOOD VI STRP: ORAL_STRIP | 1 refills | Status: DC

## 2016-12-11 NOTE — Patient Instructions (Signed)
Opioid Overdose Opioids are substances that relieve pain by binding to pain receptors in your brain and spinal cord. Opioids include illegal drugs, such as heroin, as well as prescription pain medicines.An opioid overdose happens when you take too much of an opioid substance. This can happen with any type of opioid, including:  Heroin.  Morphine.  Codeine.  Methadone.  Oxycodone.  Hydrocodone.  Fentanyl.  Hydromorphone.  Buprenorphine.  The effects of an overdose can be mild, dangerous, or even deadly. Opioid overdose is a medical emergency. What are the causes? This condition may be caused by:  Taking too much of an opioid by accident.  Taking too much of an opioid on purpose.  An error made by a health care provider who prescribes a medicine.  An error made by the pharmacist who fills the prescription order.  Using more than one substance that contains opioids at the same time.  Mixing an opioid with a substance that affects your heart, breathing, or blood pressure. These include alcohol, tranquilizers, sleeping pills, illegal drugs, and some over-the-counter medicines.  What increases the risk? This condition is more likely in:  Children. They may be attracted to colorful pills. Because of a child's small size, even a small amount of a drug can be dangerous.  Elderly people. They may be taking many different drugs. Elderly people may have difficulty reading labels or remembering when they last took their medicine.  People who take an opioid on a long-term basis.  People who use: ? Illegal drugs. ? Other substances, including alcohol, while using an opioid.  People who have: ? A history of drug or alcohol abuse. ? Certain mental health conditions.  People who take opioids that are not prescribed for them.  What are the signs or symptoms? Symptoms of this condition depend on the type of opioid and the amount that was taken. Common symptoms  include:  Sleepiness or difficulty waking from sleep.  Confusion.  Slurred speech.  Slowed breathing and a slow pulse.  Nausea and vomiting.  Abnormally small pupils.  Signs and symptoms that require emergency treatment include:  Cold, clammy, and pale skin.  Blue lips and fingernails.  Vomiting.  Gurgling sounds in the throat.  A pulse that is very slow or difficult to detect.  Breathing that is very slow, noisy, or difficult to detect.  Limp body.  Inability to respond to speech or be awakened from sleep (stupor).  How is this diagnosed? This condition is diagnosed based on your symptoms. It is important to tell your health care provider:  All of the opioidsthat you took.  When you took the opioids.  Whether you were drinking alcohol or using other substances.  Your health care provider will do a physical exam. This exam may include:  Checking and monitoring your heart rate and rhythm, your breathing rate and depth, your temperature, and your blood pressure (vital signs).  Checking for abnormally small pupils.  Measuring oxygen levels in your blood.  You may also have blood tests or urine tests. How is this treated? Supporting your vital signs and your breathing is the first step in treating an opioid overdose. Treatment may also include:  Giving fluids and minerals (electrolytes) through an IV tube.  Inserting a breathing tube (endotracheal tube) in your airway to help you breathe.  Giving oxygen.  Passing a tube through your nose and into your stomach (NG tube, or nasogastric tube) to wash out your stomach.  Giving medicines that: ? Increase your  blood pressure. ? Absorb any opioid that is in your digestive system. ? Reverse the effects of the opioid (naloxone).  Ongoing counseling and mental health support if you intentionally overdosed or used an illegal drug.  Follow these instructions at home:  Take over-the-counter and prescription  medicines only as told by your health care provider. Always ask your health care provider about possible side effects and interactions of any new medicine that you start taking.  Keep a list of all of the medicines that you take, including over-the-counter medicines. Bring this list with you to all of your medical visits.  Drink enough fluid to keep your urine clear or pale yellow.  Keep all follow-up visits as told by your health care provider. This is important. How is this prevented?  Get help if you are struggling with: ? Alcohol or drug use. ? Depression or another mental health problem.  Keep the phone number of your local poison control center near your phone or on your cell phone.  Store all medicines in safety containers that are out of the reach of children.  Read the drug inserts that come with your medicines.  Do not drink alcohol when taking opioids.  Do not use illegal drugs.  Do not take opioid medicines that are not prescribed for you. Contact a health care provider if:  Your symptoms return.  You develop new symptoms or side effects when you are taking medicines. Get help right away if:  You think that you or someone else may have taken too much of an opioid. The hotline of the [facility] is 832-770-1406.  You or someone else is having symptoms of an opioid overdose.  You have serious thoughts about hurting yourself or others.  You have: ? Chest pain. ? Difficulty breathing. ? A loss of consciousness. Opioid overdose is an emergency. Do not wait to see if the symptoms will go away. Get medical help right away. Call your local emergency services (911 in the [place] Do not drive yourself to the hospital. This information is not intended to replace advice given to you by your health care provider. Make sure you discuss any questions you have with your health care provider. Document Released: 07/26/2004 Document Revised: 11/24/2015 Document  Reviewed: 12/02/2014 Elsevier Interactive Patient Education  [employer].

## 2016-12-11 NOTE — Progress Notes (Addendum)
Safety precautions to be maintained throughout the outpatient stay will include: orient to surroundings, keep bed in low position, maintain call bell within reach at all times, provide assistance with transfer out of bed and ambulation. Intrathecal pump refilled by sterile technique witnessed by L. Sharlett Iles RN .  Wasted 10 ml in sink, witnessed by L. Sharlett Iles RN.  Signs and symptoms of drug overdose explained to patient.  States understanding.

## 2016-12-11 NOTE — Addendum Note (Signed)
Addended by: Helene Shoe on: 12/11/2016 03:07 PM   Modules accepted: Orders

## 2016-12-11 NOTE — Progress Notes (Signed)
Patient's Name: Melinda [name]  MRN: [id number]  Referring Provider: Lorre Munroe, NP  DOB: 1955/10/23  PCP: Lorre Munroe, NP  DOS: 12/11/2016  Note by: Sydnee Levans. Laban Emperor, MD  Service setting: Ambulatory outpatient  Location: [facility] (AMB) Pain Management Facility  Visit type: Procedure  Specialty: Interventional Pain Management  Patient type: Established   Primary Reason for Visit: Interventional Pain Management Treatment. CC: Back Pain (low back and both hips.)  Procedure:  Intrathecal Drug Delivery System (IDDS):  Type: Reservoir Refill (69131) No rate change Region: Abdominal Laterality: Right  Type of Pump: Medtronic Synchromed II (MRI-compatible) Delivery Route: Intrathecal Type of Pain Treated: Neuropathic/Nociceptive Primary Medication Class: Opioid/opiate  Medication, Concentration, Infusion Program, & Delivery Rate: Please see scanned programming printout.  Indications: 1. Chronic pain syndrome   2. Chronic low back pain (Bilateral) (L>R)   3. Failed back surgical syndrome   4. 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 Long term current use of opiate analgesic   6. Encounter for adjustment or management of infusion pump    Pain Assessment: Self-Reported Pain Score: 2 /10             Reported level is compatible with observation.        Intrathecal Pump Therapy Assessment  Manufacturer: Medtronic Synchromed II Type: Programmable Volume: 40 mL reservoir MRI compatibility: Yes   Drug content:  Primary Medication Class: Opioid Primary Medication: PF-Fentanyl Secondary Medication: PF-Bupivacaine Other Medication: PF-Clonidine   Programming:  Type: Simple continuous. See pump readout for details.   Changes:  Medication Change: None at this point Rate Change: No change in rate  Reported side-effects or adverse reactions: None reported  Effectiveness: Described as relatively effective, allowing for  increase in activities of daily living (ADL) Clinically meaningful improvement in function (CMIF): Sustained CMIF goals met  Plan: Pump refill today  Pre-op Assessment:  Previous date of service: 11/01/16 Service provided: Med Refill Melinda [name] is a 61 y.o. (year old), female patient, seen today for interventional treatment. She  has a past surgical history that includes Foot surgery (Right); Hand surgery (Right); Gallbladder surgery; Ablation; Ablation; HEART STENT (2009); HAND SURGEY (Left); Foot surgery (Bilateral); Foot surgery (Right); Infusion pump implantation; Eye surgery (Bilateral); Back surgery; Fracture surgery (Right, 2012); Cholecystectomy (2003); and Anterior cervical decomp/discectomy fusion (N/A, 07/28/2014). Her primarily concern today is the Back Pain (low back and both hips.)  Initial Vital Signs: Blood pressure (!) 110/47, pulse 64, temperature (!) 96.3 F (35.7 C), temperature source Oral, resp. rate 18, height 5\' 10"  (1.778 m), weight 225 lb (102.1 kg), SpO2 95 %. BMI: 32.28 kg/m  Risk Assessment: Allergies: Reviewed. She has No Known Allergies.  Allergy Precautions: None required Coagulopathies: Reviewed. None identified.  Blood-thinner therapy: None at this time Active Infection(s): Reviewed. None identified. Melinda [name] is afebrile  Site Confirmation: Melinda [name] was asked to confirm the procedure and laterality before marking the site Procedure checklist: Completed Consent: Before the procedure and under the influence of no sedative(s), amnesic(s), or anxiolytics, the patient was informed of the treatment options, risks and possible complications. To fulfill our ethical and legal obligations, as recommended by the American Medical Association's Code of Ethics, I have informed the patient of my clinical impression; the nature and purpose of the treatment or procedure; the risks, benefits, and possible complications of the intervention; the alternatives, including  doing nothing; the risk(s) and benefit(s) of the alternative treatment(s) or procedure(s); and the risk(s)  and benefit(s) of doing nothing.  Melinda [name] was provided with information about the general risks and possible complications associated with most interventional procedures. These include, but are not limited to: failure to achieve desired goals, infection, bleeding, organ or nerve damage, allergic reactions, paralysis, and/or death.  In addition, she was informed of those risks and possible complications associated to this particular procedure, which include, but are not limited to: damage to the implant; failure to decrease pain; local, systemic, or serious CNS infections, intraspinal abscess with possible cord compression and paralysis, or life-threatening such as meningitis; bleeding; organ damage; nerve injury or damage with subsequent sensory, motor, and/or autonomic system dysfunction, resulting in transient or permanent pain, numbness, and/or weakness of one or several areas of the body; allergic reactions, either minor or major life-threatening, such as anaphylactic or anaphylactoid reactions.  Furthermore, Melinda [name] was informed of those risks and complications associated with the medications. These include, but are not limited to: allergic reactions (i.e.: anaphylactic or anaphylactoid reactions); endorphine suppression; bradycardia and/or hypotension; water retention and/or peripheral vascular relaxation leading to lower extremity edema and possible stasis ulcers; respiratory depression and/or shortness of breath; decreased metabolic rate leading to weight gain; swelling or edema; medication-induced neural toxicity; particulate matter embolism and blood vessel occlusion with resultant organ, and/or nervous system infarction; and/or intrathecal granuloma formation with possible spinal cord compression and permanent paralysis.  Before refilling the pump Melinda [name] was informed that some of  the medications used in the devise may not be FDA approved for such use and therefore it constitutes an off-label use of the medications.  Finally, she was informed that Medicine is not an exact science; therefore, there is also the possibility of unforeseen or unpredictable risks and/or possible complications that may result in a catastrophic outcome. The patient indicated having understood very clearly. We have given the patient no guarantees and we have made no promises. Enough time was given to the patient to ask questions, all of which were answered to the patient's satisfaction. Ms. Korte has indicated that she wanted to continue with the procedure. Attestation: I, the ordering provider, attest that I have discussed with the patient the benefits, risks, side-effects, alternatives, likelihood of achieving goals, and potential problems during recovery for the procedure that I have provided informed consent. Date: 12/11/2016; Time: 7:10 PM  Pre-Procedure Preparation:  Monitoring: As per clinic protocol. Respiration, ETCO2, SpO2, BP, heart rate and rhythm monitor placed and checked for adequate function Safety Precautions: Patient was assessed for positional comfort and pressure points before starting the procedure. Time-out: I initiated and conducted the "Time-out" before starting the procedure, as per protocol. The patient was asked to participate by confirming the accuracy of the "Time Out" information. Verification of the correct person, site, and procedure were performed and confirmed by me, the nursing staff, and the patient. "Time-out" conducted as per Joint Commission's Universal Protocol (UP.01.01.01). "Time-out" Date & Time: 12/11/2016; 1132 hrs.  Description of Procedure Process:   Position: Supine Target Area: Central-port of intrathecal pump. Approach: Anterior, 90 degree angle approach. Area Prepped: Entire Area around the pump implant. Prepping solution: ChloraPrep (2% chlorhexidine  gluconate and 70% isopropyl alcohol) Safety Precautions: Aspiration looking for blood return was conducted prior to all injections. At no point did we inject any substances, as a needle was being advanced. No attempts were made at seeking any paresthesias. Safe injection practices and needle disposal techniques used. Medications properly checked for expiration dates. SDV (single dose vial) medications used. Description of  the Procedure: Protocol guidelines were followed. Two nurses trained to do implant refills were present during the entire procedure. The refill medication was checked by both healthcare providers as well as the patient. The patient was included in the "Time-out" to verify the medication. The patient was placed in position. The pump was identified. The area was prepped in the usual manner. The sterile template was positioned over the pump, making sure the side-port location matched that of the pump. Both, the pump and the template were held for stability. The needle provided in the Medtronic Kit was then introduced thru the center of the template and into the central port. The pump content was aspirated and discarded volume documented. The new medication was slowly infused into the pump, thru the filter, making sure to avoid overpressure of the device. The needle was then removed and the area cleansed, making sure to leave some of the prepping solution back to take advantage of its long term bactericidal properties. The pump was interrogated and programmed to reflect the correct medication, volume, and dosage. The program was printed and taken to the physician for approval. Once checked and signed by the physician, a copy was provided to the patient and another scanned into the EMR. Vitals:   12/11/16 1043  BP: (!) 110/47  Pulse: 64  Resp: 18  Temp: (!) 96.3 F (35.7 C)  TempSrc: Oral  SpO2: 95%  Weight: 225 lb (102.1 kg)  Height: '5\' 10"'$  (1.778 m)    Start Time: 1133 hrs. End Time:  1150 hrs. Materials & Medications: Medtronic Refill Kit Medication(s): Please see chart orders for details.  Imaging Guidance:  Type of Imaging Technique: None used Indication(s): N/A Exposure Time: No patient exposure Contrast: None used. Fluoroscopic Guidance: N/A Ultrasound Guidance: N/A Interpretation: N/A  Antibiotic Prophylaxis:  Indication(s): None identified Antibiotic given: None  Post-operative Assessment:  EBL: None Complications: No immediate post-treatment complications observed by team, or reported by patient. Note: The patient tolerated the entire procedure well. A repeat set of vitals were taken after the procedure and the patient was kept under observation following institutional policy, for this type of procedure. Post-procedural neurological assessment was performed, showing return to baseline, prior to discharge. The patient was provided with post-procedure discharge instructions, including a section on how to identify potential problems. Should any problems arise concerning this procedure, the patient was given instructions to immediately contact us, at any time, without hesitation. In any case, we plan to contact the patient by telephone for a follow-up status report regarding this interventional procedure. Comments:  No additional relevant information.  Plan of Care  Disposition: Discharge home  Discharge Date & Time: 12/11/2016; 1153 hrs.  Physician-requested Follow-up:  Return for Med-Mgmt, by NP, in addition, Pump Refill (as per pump program), w/ MD.  Future Appointments Date Time Provider [place]  01/29/2017 1:30 PM [name], MD [facility] None  04/01/2017 11:00 AM [name], MD [facility] None   Medications ordered for procedure: No orders of the defined types were placed in this encounter.  Medications administered: Ms. Troeger had no medications administered during this visit.  See the medical record for exact dosing, route,  and time of administration.  Lab-work, Procedure(s), & Referral(s) Ordered: Orders Placed This Encounter  Procedures  . PUMP REFILL  . PUMP REFILL  . Informed Consent Details: Transcribe to consent form and obtain patient signature  . Provider attestation of informed consent for procedure/surgical case  . Verify informed consent  . Discharge instructions  Imaging Ordered: New Prescriptions   No medications on file   Primary Care Physician: Jearld Fenton, NP Location: [facility] Note by: Kathlen Brunswick. Dossie Arbour, M.D, DABA, DABAPM, DABPM, DABIPP, FIPP Date: 12/11/2016; Time: 7:12 PM  Disclaimer:  Medicine is not an Chief Strategy Officer. The only guarantee in medicine is that nothing is guaranteed. It is important to note that the decision to proceed with this intervention was based on the information collected from the patient. The Data and conclusions were drawn from the patient's questionnaire, the interview, and the physical examination. Because the information was provided in large part by the patient, it cannot be guaranteed that it has not been purposely or unconsciously manipulated. Every effort has been made to obtain as much relevant data as possible for this evaluation. It is important to note that the conclusions that lead to this procedure are derived in large part from the available data. Always take into account that the treatment will also be dependent on availability of resources and existing treatment guidelines, considered by other Pain Management Practitioners as being common knowledge and practice, at the time of the intervention. For Medico-Legal purposes, it is also important to point out that variation in procedural techniques and pharmacological choices are the acceptable norm. The indications, contraindications, technique, and results of the above procedure should only be interpreted and judged by a Board-Certified Interventional Pain Specialist with  extensive familiarity and expertise in the same exact procedure and technique.  Instructions provided at this appointment: Patient Instructions  Opioid Overdose Opioids are substances that relieve pain by binding to pain receptors in your brain and spinal cord. Opioids include illegal drugs, such as heroin, as well as prescription pain medicines.An opioid overdose happens when you take too much of an opioid substance. This can happen with any type of opioid, including:  Heroin.  Morphine.  Codeine.  Methadone.  Oxycodone.  Hydrocodone.  Fentanyl.  Hydromorphone.  Buprenorphine.  The effects of an overdose can be mild, dangerous, or even deadly. Opioid overdose is a medical emergency. What are the causes? This condition may be caused by:  Taking too much of an opioid by accident.  Taking too much of an opioid on purpose.  An error made by a health care provider who prescribes a medicine.  An error made by the pharmacist who fills the prescription order.  Using more than one substance that contains opioids at the same time.  Mixing an opioid with a substance that affects your heart, breathing, or blood pressure. These include alcohol, tranquilizers, sleeping pills, illegal drugs, and some over-the-counter medicines.  What increases the risk? This condition is more likely in:  Children. They may be attracted to colorful pills. Because of a child's small size, even a small amount of a drug can be dangerous.  Elderly people. They may be taking many different drugs. Elderly people may have difficulty reading labels or remembering when they last took their medicine.  People who take an opioid on a long-term basis.  People who use: ? Illegal drugs. ? Other substances, including alcohol, while using an opioid.  People who have: ? A history of drug or alcohol abuse. ? Certain mental health conditions.  People who take opioids that are not prescribed for them.  What  are the signs or symptoms? Symptoms of this condition depend on the type of opioid and the amount that was taken. Common symptoms include:  Sleepiness or difficulty waking from sleep.  Confusion.  Slurred speech.  Slowed breathing and a slow pulse.  Nausea and vomiting.  Abnormally small pupils.  Signs and symptoms that require emergency treatment include:  Cold, clammy, and pale skin.  Blue lips and fingernails.  Vomiting.  Gurgling sounds in the throat.  A pulse that is very slow or difficult to detect.  Breathing that is very slow, noisy, or difficult to detect.  Limp body.  Inability to respond to speech or be awakened from sleep (stupor).  How is this diagnosed? This condition is diagnosed based on your symptoms. It is important to tell your health care provider:  All of the opioidsthat you took.  When you took the opioids.  Whether you were drinking alcohol or using other substances.  Your health care provider will do a physical exam. This exam may include:  Checking and monitoring your heart rate and rhythm, your breathing rate and depth, your temperature, and your blood pressure (vital signs).  Checking for abnormally small pupils.  Measuring oxygen levels in your blood.  You may also have blood tests or urine tests. How is this treated? Supporting your vital signs and your breathing is the first step in treating an opioid overdose. Treatment may also include:  Giving fluids and minerals (electrolytes) through an IV tube.  Inserting a breathing tube (endotracheal tube) in your airway to help you breathe.  Giving oxygen.  Passing a tube through your nose and into your stomach (NG tube, or nasogastric tube) to wash out your stomach.  Giving medicines that: ? Increase your blood pressure. ? Absorb any opioid that is in your digestive system. ? Reverse the effects of the opioid (naloxone).  Ongoing counseling and mental health support if you  intentionally overdosed or used an illegal drug.  Follow these instructions at home:  Take over-the-counter and prescription medicines only as told by your health care provider. Always ask your health care provider about possible side effects and interactions of any new medicine that you start taking.  Keep a list of all of the medicines that you take, including over-the-counter medicines. Bring this list with you to all of your medical visits.  Drink enough fluid to keep your urine clear or pale yellow.  Keep all follow-up visits as told by your health care provider. This is important. How is this prevented?  Get help if you are struggling with: ? Alcohol or drug use. ? Depression or another mental health problem.  Keep the phone number of your local poison control center near your phone or on your cell phone.  Store all medicines in safety containers that are out of the reach of children.  Read the drug inserts that come with your medicines.  Do not drink alcohol when taking opioids.  Do not use illegal drugs.  Do not take opioid medicines that are not prescribed for you. Contact a health care provider if:  Your symptoms return.  You develop new symptoms or side effects when you are taking medicines. Get help right away if:  You think that you or someone else may have taken too much of an opioid. The hotline of the [facility] is 209-225-4107.  You or someone else is having symptoms of an opioid overdose.  You have serious thoughts about hurting yourself or others.  You have: ? Chest pain. ? Difficulty breathing. ? A loss of consciousness. Opioid overdose is an emergency. Do not wait to see if the symptoms will go away. Get medical help right away. Call your  local emergency services (911 in the [place] Do not drive yourself to the hospital. This information is not intended to replace advice given to you by your health care provider. Make sure you  discuss any questions you have with your health care provider. Document Released: 07/26/2004 Document Revised: 11/24/2015 Document Reviewed: 12/02/2014 Elsevier Interactive Patient Education  [employer].

## 2016-12-11 NOTE — Telephone Encounter (Signed)
Dollie with [facility] [place] left v/m requesting prn removed from test strips rx. Medicare does not like prn on rx.Please advise.

## 2016-12-12 ENCOUNTER — Telehealth: Payer: Self-pay | Admitting: *Deleted

## 2016-12-12 NOTE — Telephone Encounter (Signed)
Attempted to call after pump refill. Message left.

## 2016-12-12 NOTE — Telephone Encounter (Signed)
Rx sent through e-scribe  

## 2016-12-18 DIAGNOSIS — M2041 Other hammer toe(s) (acquired), right foot: Secondary | ICD-10-CM | POA: Diagnosis not present

## 2016-12-18 DIAGNOSIS — L97512 Non-pressure chronic ulcer of other part of right foot with fat layer exposed: Secondary | ICD-10-CM | POA: Diagnosis not present

## 2016-12-18 DIAGNOSIS — E1142 Type 2 diabetes mellitus with diabetic polyneuropathy: Secondary | ICD-10-CM | POA: Diagnosis not present

## 2016-12-18 DIAGNOSIS — M79671 Pain in right foot: Secondary | ICD-10-CM | POA: Diagnosis not present

## 2016-12-20 ENCOUNTER — Telehealth: Payer: Self-pay | Admitting: Pain Medicine

## 2016-12-20 NOTE — Telephone Encounter (Signed)
Patient called to resched her pump refill date of 04-01-17 to 04-02-17 at 11:15. Please mark in your pain pump book so we order meds on correct date. Thank you

## 2016-12-20 NOTE — Telephone Encounter (Signed)
Done per [facility]

## 2017-01-10 DIAGNOSIS — L97521 Non-pressure chronic ulcer of other part of left foot limited to breakdown of skin: Secondary | ICD-10-CM | POA: Diagnosis not present

## 2017-01-21 ENCOUNTER — Other Ambulatory Visit: Payer: Self-pay | Admitting: *Deleted

## 2017-01-21 MED ORDER — ACCU-CHEK SOFTCLIX LANCETS MISC: 2 refills | Status: DC

## 2017-01-21 NOTE — Telephone Encounter (Signed)
Received fax from [facility] stating Dx code is missing for Accu-chek Softclix lancets prescription.   [facility] [place].

## 2017-01-28 ENCOUNTER — Encounter: Payer: Self-pay | Admitting: Pain Medicine

## 2017-01-28 ENCOUNTER — Ambulatory Visit: Payer: Medicare Other | Attending: Pain Medicine | Admitting: Pain Medicine

## 2017-01-28 ENCOUNTER — Other Ambulatory Visit: Payer: Self-pay | Admitting: Internal Medicine

## 2017-01-28 VITALS — BP 145/81 | HR 67 | Temp 98.2°F | Resp 16 | Ht 70.0 in | Wt 230.0 lb

## 2017-01-28 DIAGNOSIS — Z955 Presence of coronary angioplasty implant and graft: Secondary | ICD-10-CM | POA: Diagnosis not present

## 2017-01-28 DIAGNOSIS — Z79891 Long term (current) use of opiate analgesic: Secondary | ICD-10-CM | POA: Insufficient documentation

## 2017-01-28 DIAGNOSIS — G9619 Other disorders of meninges, not elsewhere classified: Secondary | ICD-10-CM | POA: Insufficient documentation

## 2017-01-28 DIAGNOSIS — Z451 Encounter for adjustment and management of infusion pump: Secondary | ICD-10-CM | POA: Diagnosis not present

## 2017-01-28 DIAGNOSIS — F419 Anxiety disorder, unspecified: Secondary | ICD-10-CM | POA: Diagnosis not present

## 2017-01-28 DIAGNOSIS — M7918 Myalgia, other site: Secondary | ICD-10-CM

## 2017-01-28 DIAGNOSIS — M797 Fibromyalgia: Secondary | ICD-10-CM | POA: Diagnosis not present

## 2017-01-28 DIAGNOSIS — S98111A Complete traumatic amputation of right great toe, initial encounter: Secondary | ICD-10-CM

## 2017-01-28 DIAGNOSIS — M47812 Spondylosis without myelopathy or radiculopathy, cervical region: Secondary | ICD-10-CM | POA: Insufficient documentation

## 2017-01-28 DIAGNOSIS — Z6833 Body mass index (BMI) 33.0-33.9, adult: Secondary | ICD-10-CM | POA: Insufficient documentation

## 2017-01-28 DIAGNOSIS — M159 Polyosteoarthritis, unspecified: Secondary | ICD-10-CM

## 2017-01-28 DIAGNOSIS — F1190 Opioid use, unspecified, uncomplicated: Secondary | ICD-10-CM

## 2017-01-28 DIAGNOSIS — E785 Hyperlipidemia, unspecified: Secondary | ICD-10-CM | POA: Insufficient documentation

## 2017-01-28 DIAGNOSIS — K589 Irritable bowel syndrome without diarrhea: Secondary | ICD-10-CM | POA: Insufficient documentation

## 2017-01-28 DIAGNOSIS — G894 Chronic pain syndrome: Secondary | ICD-10-CM | POA: Diagnosis not present

## 2017-01-28 DIAGNOSIS — Z79899 Other long term (current) drug therapy: Secondary | ICD-10-CM | POA: Diagnosis not present

## 2017-01-28 DIAGNOSIS — M961 Postlaminectomy syndrome, not elsewhere classified: Secondary | ICD-10-CM | POA: Diagnosis not present

## 2017-01-28 DIAGNOSIS — M1991 Primary osteoarthritis, unspecified site: Secondary | ICD-10-CM | POA: Diagnosis not present

## 2017-01-28 DIAGNOSIS — Z833 Family history of diabetes mellitus: Secondary | ICD-10-CM | POA: Diagnosis not present

## 2017-01-28 DIAGNOSIS — I96 Gangrene, not elsewhere classified: Secondary | ICD-10-CM | POA: Diagnosis not present

## 2017-01-28 DIAGNOSIS — Z7982 Long term (current) use of aspirin: Secondary | ICD-10-CM | POA: Insufficient documentation

## 2017-01-28 DIAGNOSIS — L97512 Non-pressure chronic ulcer of other part of right foot with fat layer exposed: Secondary | ICD-10-CM | POA: Diagnosis not present

## 2017-01-28 DIAGNOSIS — F329 Major depressive disorder, single episode, unspecified: Secondary | ICD-10-CM | POA: Diagnosis not present

## 2017-01-28 DIAGNOSIS — M8949 Other hypertrophic osteoarthropathy, multiple sites: Secondary | ICD-10-CM

## 2017-01-28 DIAGNOSIS — E1142 Type 2 diabetes mellitus with diabetic polyneuropathy: Secondary | ICD-10-CM | POA: Insufficient documentation

## 2017-01-28 DIAGNOSIS — E669 Obesity, unspecified: Secondary | ICD-10-CM | POA: Diagnosis not present

## 2017-01-28 DIAGNOSIS — M4800 Spinal stenosis, site unspecified: Secondary | ICD-10-CM | POA: Diagnosis not present

## 2017-01-28 DIAGNOSIS — M150 Primary generalized (osteo)arthritis: Secondary | ICD-10-CM

## 2017-01-28 DIAGNOSIS — I2510 Atherosclerotic heart disease of native coronary artery without angina pectoris: Secondary | ICD-10-CM | POA: Insufficient documentation

## 2017-01-28 DIAGNOSIS — M170 Bilateral primary osteoarthritis of knee: Secondary | ICD-10-CM | POA: Insufficient documentation

## 2017-01-28 DIAGNOSIS — Z801 Family history of malignant neoplasm of trachea, bronchus and lung: Secondary | ICD-10-CM | POA: Insufficient documentation

## 2017-01-28 DIAGNOSIS — G4730 Sleep apnea, unspecified: Secondary | ICD-10-CM | POA: Insufficient documentation

## 2017-01-28 DIAGNOSIS — Z89411 Acquired absence of right great toe: Secondary | ICD-10-CM

## 2017-01-28 DIAGNOSIS — M542 Cervicalgia: Secondary | ICD-10-CM | POA: Insufficient documentation

## 2017-01-28 DIAGNOSIS — M25561 Pain in right knee: Secondary | ICD-10-CM | POA: Diagnosis present

## 2017-01-28 DIAGNOSIS — M791 Myalgia: Secondary | ICD-10-CM

## 2017-01-28 DIAGNOSIS — Z8614 Personal history of Methicillin resistant Staphylococcus aureus infection: Secondary | ICD-10-CM | POA: Insufficient documentation

## 2017-01-28 DIAGNOSIS — Z87891 Personal history of nicotine dependence: Secondary | ICD-10-CM | POA: Diagnosis not present

## 2017-01-28 MED ORDER — OXYCODONE HCL 5 MG PO TABS: 5.0000 mg | ORAL_TABLET | Freq: Three times a day (TID) | ORAL | 0 refills | Status: DC | PRN

## 2017-01-28 MED ORDER — MELOXICAM 15 MG PO TABS: 15.0000 mg | ORAL_TABLET | Freq: Every day | ORAL | 0 refills | Status: DC

## 2017-01-28 MED ORDER — TIZANIDINE HCL 4 MG PO CAPS: 4.0000 mg | ORAL_CAPSULE | Freq: Three times a day (TID) | ORAL | 0 refills | Status: DC

## 2017-01-28 MED ORDER — PREGABALIN 100 MG PO CAPS: 100.0000 mg | ORAL_CAPSULE | Freq: Three times a day (TID) | ORAL | 0 refills | Status: DC

## 2017-01-28 NOTE — Patient Instructions (Signed)
Prescriptions for Zanaflex and Meloxicam were sent to your pharmacy. You were given one prescription for Lyrica and 3 for Oxycodone.

## 2017-01-28 NOTE — Progress Notes (Signed)
Nursing Pain Medication Assessment:  Safety precautions to be maintained throughout the outpatient stay will include: orient to surroundings, keep bed in low position, maintain call bell within reach at all times, provide assistance with transfer out of bed and ambulation.  Medication Inspection Compliance: Pill count conducted under aseptic conditions, in front of the patient. Neither the pills nor the bottle was removed from the patient's sight at any time. Once count was completed pills were immediately returned to the patient in their original bottle.  Medication: See above Pill/Patch Count: 18 of 90 pills remain Pill/Patch Appearance: Markings consistent with prescribed medication Bottle Appearance: Standard pharmacy container. Clearly labeled. Filled Date: 6 / 22 / 2018 Last Medication intake:  Today

## 2017-01-28 NOTE — Progress Notes (Signed)
Patient's Name: Melinda [name]  MRN: [id number]  Referring Provider: Lorre Munroe, NP  DOB: [birth year]  PCP: Lorre Munroe, NP  DOS: 01/28/2017  Note by: Oswaldo Done, MD  Service setting: Ambulatory outpatient  Specialty: Interventional Pain Management  Location: [facility] (AMB) Pain Management Facility    Patient type: Established   Primary Reason(s) for Visit: Encounter for prescription drug management. (Level of risk: moderate)  CC: Knee Pain (right) and Back Pain (upper)  HPI  Melinda [name] is a 61 y.o. year old, female patient, who comes today for a medication management evaluation. She has Chronic pain syndrome; CAD (coronary artery disease); Obesity (BMI 30-39.9); Pure hypercholesterolemia; DM2 (diabetes mellitus, type 2) (HCC); Great toe amputation status (HCC); Failed back surgical syndrome; Failed cervical surgery syndrome (ACDF); Chronic neck pain; Chronic low back pain (Bilateral) (L>R); Epidural fibrosis; Cervical spondylosis; Neuropathic pain; Fibromyalgia; Presence of functional implant (Medtronic programmable intrathecal pump) (Right abdominal area); Presence of intrathecal pump (Medtronic intrathecal programmable pump) (40 mL pump); Opioid-induced constipation (OIC); Chronic knee pain (Location of Primary Source of Pain) (Right); Osteoarthritis of knee (Left); Depression; Long term current use of opiate analgesic; Long term prescription opiate use; Opiate use; Encounter for adjustment or management of infusion pump; Osteoarthritis, multiple sites; Musculoskeletal pain; Osteoarthritis of knee (Bilateral) (R>L); and Paronychia of great toe of left foot on her problem list. Her primarily concern today is the Knee Pain (right) and Back Pain (upper)  Pain Assessment: Location: Right Knee Radiating: N/A Onset: More than a month ago Duration: Chronic pain Quality: Constant, Aching, Stabbing Severity: 4 /10 (self-reported pain score)  Note: Reported level is compatible with  observation.                   Effect on ADL: effects ankle, hips, walking,brace hurts to wear Timing: Constant Modifying factors: motrin, elevation, ice, rest, essential oils  Melinda [name] was last scheduled for an appointment on 12/20/2016 for medication management. During today's appointment we reviewed [name]'s chronic pain status, as well as her outpatient medication regimen.  The patient  reports that she does not use drugs. Her body mass index is 33 kg/m.  Further details on both, my assessment(s), as well as the proposed treatment plan, please see below.  Controlled Substance Pharmacotherapy Assessment REMS (Risk Evaluation and Mitigation Strategy)  Analgesic:Oxycodone IR 5 mg every 8 hours (15 mg/day), in addition to the intrathecal pump medication. MME/day:22 mg/day of oral medication. Newman Pies, RN  01/28/2017  2:55 PM  Sign at close encounter Nursing Pain Medication Assessment:  Safety precautions to be maintained throughout the outpatient stay will include: orient to surroundings, keep bed in low position, maintain call bell within reach at all times, provide assistance with transfer out of bed and ambulation.  Medication Inspection Compliance: Pill count conducted under aseptic conditions, in front of the patient. Neither the pills nor the bottle was removed from the patient's sight at any time. Once count was completed pills were immediately returned to the patient in their original bottle.  Medication: See above Pill/Patch Count: 18 of 90 pills remain Pill/Patch Appearance: Markings consistent with prescribed medication Bottle Appearance: Standard pharmacy container. Clearly labeled. Filled Date: 6 / 22 / 2018 Last Medication intake:  Today   Pharmacokinetics: Liberation and absorption (onset of action): WNL Distribution (time to peak effect): WNL Metabolism and excretion (duration of action): WNL         Pharmacodynamics: Desired effects: Analgesia: Ms.  Lamora reports >50% benefit.  Functional ability: Patient reports that medication allows her to accomplish basic ADLs Clinically meaningful improvement in function (CMIF): Sustained CMIF goals met Perceived effectiveness: Described as relatively effective, allowing for increase in activities of daily living (ADL) Undesirable effects: Side-effects or Adverse reactions: None reported Monitoring: [place] PMP: Online review of the past 54-monthperiod conducted. Compliant with practice rules and regulations List of all UDS test(s) done:  Lab Results  Component Value Date   TOXASSSELUR FINAL 06/11/2016   [place] 10/18/2015   TEdgarFINAL 07/27/2015   Last UDS on record: ToxAssure Select 13  Date Value Ref Range Status  06/11/2016 FINAL  Final    Comment:    ==================================================================== TOXASSURE SELECT 13 (MW) ==================================================================== Test                             Result       Flag       Units Drug Present and Declared for Prescription Verification   Oxycodone                      1626         EXPECTED   ng/mg creat   Oxymorphone                    262          EXPECTED   ng/mg creat   Noroxycodone                   3199         EXPECTED   ng/mg creat    Sources of oxycodone include scheduled prescription medications.    Oxymorphone and noroxycodone are expected metabolites of    oxycodone. Oxymorphone is also available as a scheduled    prescription medication.   Fentanyl                       14           EXPECTED   ng/mg creat   Norfentanyl                    34           EXPECTED   ng/mg creat    Source of fentanyl is a scheduled prescription medication,    including IV, patch, and transmucosal formulations. Norfentanyl    is an expected metabolite of fentanyl. ==================================================================== Test                      Result    Flag   Units       Ref Range   Creatinine              190              mg/dL      >=20 ==================================================================== Declared Medications:  The flagging and interpretation on this report are based on the  following declared medications.  Unexpected results may arise from  inaccuracies in the declared medications.  **Note: The testing scope of this panel includes these medications:  Fentanyl  Oxycodone  **Note: The testing scope of this panel does not include following  reported medications:  Aspirin (Aspirin 81)  Bupropion  Cholecalciferol  Chondroitin (Glucosamine-Chondroitin)  Glucosamine (Glucosamine-Chondroitin)  Isosorbide (Isordil)  Magnesium  Meloxicam (Mobic)  Multivitamin  Nitroglycerin (Nitrostat)  Omega-3 Fatty Acids (Fish Oil)  Paroxetine (Paxil)  Polyethylene Glycol  Pregabalin (Lyrica)  Rosuvastatin (Crestor)  Supplement  Tizanidine (Zanaflex)  Ubiquinone (CoQ10)  Vitamin E ==================================================================== For clinical consultation, please call [phone]. ====================================================================    UDS interpretation: Compliant          Medication Assessment Form: Reviewed. Patient indicates being compliant with therapy Treatment compliance: Compliant Risk Assessment Profile: Aberrant behavior: See prior evaluations. None observed or detected today Comorbid factors increasing risk of overdose: See prior notes. No additional risks detected today Risk of substance use disorder (SUD): Low Opioid Risk Tool (ORT) Total Score: 3  Interpretation Table:  Score <3 = Low Risk for SUD  Score between 4-7 = Moderate Risk for SUD  Score >8 = High Risk for Opioid Abuse   Risk Mitigation Strategies:  Patient Counseling: Covered Patient-Prescriber Agreement (PPA): Present and active  Notification to other healthcare providers: Done  Pharmacologic Plan: No change in therapy, at  this time  Laboratory Chemistry  Inflammation Markers (CRP: Acute Phase) (ESR: Chronic Phase) Lab Results  Component Value Date   CRP 0.2 09/18/2010   ESRSEDRATE 16 09/18/2010                 Renal Function Markers Lab Results  Component Value Date   BUN 22 (H) 01/31/2017   CREATININE 1.05 (H) 01/31/2017   GFRAA >60 01/31/2017   GFRNONAA 57 (L) 01/31/2017                 Hepatic Function Markers Lab Results  Component Value Date   AST 20 05/18/2016   ALT 18 05/18/2016   ALBUMIN 4.8 05/18/2016   ALKPHOS 86 05/18/2016   HCVAB NEGATIVE 05/18/2016                 Electrolytes Lab Results  Component Value Date   NA 141 01/31/2017   K 4.2 01/31/2017   CL 104 01/31/2017   CALCIUM 9.4 01/31/2017   MG 2.1 06/30/2013                 Neuropathy Markers Lab Results  Component Value Date   VITAMINB12 319 10/04/2013                 Bone Pathology Markers Lab Results  Component Value Date   ALKPHOS 86 05/18/2016   CALCIUM 9.4 01/31/2017                 Coagulation Parameters Lab Results  Component Value Date   INR 1.12 09/11/2015   LABPROT 14.6 09/11/2015   APTT 31 09/11/2015   PLT 224 01/31/2017                 Cardiovascular Markers Lab Results  Component Value Date   HGB 12.8 01/31/2017   HCT 36.9 01/31/2017                 Note: Lab results reviewed.  Recent Diagnostic Imaging Review  Dg Lumbar Spine Complete  Result Date: 05/18/2016 CLINICAL DATA:  Right side sciatica. EXAM: LUMBAR SPINE - COMPLETE 4+ VIEW COMPARISON:  None. FINDINGS: Mild rightward scoliosis. Diffuse degenerative disc and facet disease in the lumbar spine. No fracture or subluxation. SI joints are symmetric and unremarkable. Spinal stimulator device partially imaged. The tip of the wire is not visualized. IMPRESSION: Degenerative disc and facet disease. Rightward scoliosis. No acute findings. Electronically Signed   By: Rolm Baptise M.D.   On: 05/18/2016 17:12   Note: Imaging  results reviewed.  Meds   Current Meds  Medication Sig  . ACCU-CHEK SOFTCLIX LANCETS lancets Use to check blood sugar 2 times daily.  Dx: E11.42  . acetaminophen (TYLENOL) 500 MG tablet Take 1,000 mg by mouth every 6 (six) hours as needed (for pain/headache.).  Marland Kitchen AMBULATORY NON FORMULARY MEDICATION Medication Name: CPAP MASK OF CHOICE FOR HOME DEVICE  . aspirin 81 MG tablet Take 81 mg by mouth daily.  Marland Kitchen buPROPion (WELLBUTRIN XL) 150 MG 24 hr tablet TAKE 1 TABLET BY MOUTH DAILY  . Cholecalciferol (VITAMIN D3) 2000 units TABS Take 2,000 Units by mouth daily.  . Chromium Picolinate 800 MCG TABS Take 800 mcg by mouth daily.  Wallace Cullens POWD Take 1 capsule by mouth daily.  . Coenzyme Q10 (CO Q 10) 100 MG CAPS Take 200 mg by mouth daily.   Marland Kitchen doxycycline (VIBRA-TABS) 100 MG tablet Take 100 mg by mouth 2 (two) times daily.  Marland Kitchen glucosamine-chondroitin 500-400 MG tablet Take 1 tablet by mouth daily.   Marland Kitchen glucose blood (ACCU-CHEK AVIVA PLUS) test strip TEST 2 TIMES DAY  **E11.9**  . glucose blood test strip 1 each by Other route. Use as instructed  . GRAPE SEED EXTRACT PO Take 1 capsule by mouth daily.  Marland Kitchen ibuprofen (ADVIL,MOTRIN) 200 MG tablet Take 600 mg by mouth every 8 (eight) hours as needed (for pain.).  Marland Kitchen MAGNESIUM MALATE PO Take 115 mg by mouth daily.  Derrill Memo ON 02/04/2017] meloxicam (MOBIC) 15 MG tablet Take 1 tablet (15 mg total) by mouth daily.  . Multiple Vitamin (MULTIVITAMIN) tablet Take 1 tablet by mouth daily.  . nitroGLYCERIN (NITROSTAT) 0.4 MG SL tablet Place 1 tablet (0.4 mg total) under the tongue every 5 (five) minutes as needed for chest pain.  . Omega-3 Fatty Acids (FISH OIL) 1200 MG CAPS Take 1,200 mg by mouth daily.   Derrill Memo ON 02/04/2017] oxyCODONE (OXY IR/ROXICODONE) 5 MG immediate release tablet Take 1 tablet (5 mg total) by mouth every 8 (eight) hours as needed for severe pain.  Marland Kitchen PARoxetine (PAXIL) 20 MG tablet TAKE ONE-HALF OF A TABLET BY MOUTH TWICEDAILY  (Patient taking differently: TAKE ONE-HALF OF A TABLET BY MOUTH DAILY IN THE MORNING)  . polyethylene glycol (MIRALAX / GLYCOLAX) packet Take 17 g by mouth daily as needed (for constipation.).   Derrill Memo ON 02/04/2017] pregabalin (LYRICA) 100 MG capsule Take 1 capsule (100 mg total) by mouth 3 (three) times daily.  . rosuvastatin (CRESTOR) 20 MG tablet Take 1 tablet (20 mg total) by mouth daily.  Derrill Memo ON 02/04/2017] tiZANidine (ZANAFLEX) 4 MG capsule Take 1 capsule (4 mg total) by mouth 3 (three) times daily.  . vitamin E 400 UNIT capsule Take 400 Units by mouth daily.  . [DISCONTINUED] buPROPion (WELLBUTRIN XL) 150 MG 24 hr tablet TAKE 1 TABLET BY MOUTH DAILY  . [DISCONTINUED] Ginger, Zingiber officinalis, (GINGER ROOT PO) Take 1.1 g by mouth daily.  . [DISCONTINUED] meloxicam (MOBIC) 15 MG tablet Take 1 tablet (15 mg total) by mouth daily.  . [DISCONTINUED] oxyCODONE (OXY IR/ROXICODONE) 5 MG immediate release tablet Take 1 tablet (5 mg total) by mouth every 8 (eight) hours as needed for severe pain.  . [DISCONTINUED] pregabalin (LYRICA) 100 MG capsule Take 1 capsule (100 mg total) by mouth 3 (three) times daily.  . [DISCONTINUED] tiZANidine (ZANAFLEX) 4 MG capsule Take 1 capsule (4 mg total) by mouth 3 (three) times daily.    ROS  Constitutional: Denies any fever or chills Gastrointestinal: No  reported hemesis, hematochezia, vomiting, or acute GI distress Musculoskeletal: Denies any acute onset joint swelling, redness, loss of ROM, or weakness Neurological: No reported episodes of acute onset apraxia, aphasia, dysarthria, agnosia, amnesia, paralysis, loss of coordination, or loss of consciousness  Allergies  Ms. Nakata has No Known Allergies.  [place]  Drug: Ms. Wolken  reports that she does not use drugs. Alcohol:  reports that she drinks alcohol. Tobacco:  reports that she quit smoking about 9 years ago. Her smoking use included Cigarettes. She has a 48.00 pack-year smoking history. She  has never used smokeless tobacco. Medical:  has a past medical history of Anemia; Anxiety; Blue toes; Bulging disc; CAD (coronary artery disease) (2009); Cardiac arrhythmia due to congenital heart disease; Chicken pox; Coronary arteritis; Degenerative disc disease; Depression; Diabetes mellitus without complication ([place]); Facet joint disease ([place]); Fibromyalgia; Heart disease; Hyperlipidemia; IBS (irritable bowel syndrome); MCL deficiency, knee; MRSA (methicillin resistant staph aureus) culture positive (2011); Neuropathy (04/18/2010); Peripheral neuropathy; Sleep apnea; and Spinal stenosis. Surgical: Ms. Lovick  has a past surgical history that includes Foot surgery (Right); Hand surgery (Left); Gallbladder surgery; Ablation; Ablation; HEART STENT (2009); HAND SURGEY (Left); Foot surgery (Bilateral); Foot surgery (Right); Infusion pump implantation; Back surgery; Cholecystectomy (2003); Anterior cervical decomp/discectomy fusion (N/A, 07/28/2014); Coronary angioplasty; Carpal tunnel release (Right); and Eye surgery (Bilateral, 2013). Family: family history includes Colon cancer in her paternal grandfather; Diabetes in her father and paternal grandmother; Heart disease in her maternal grandfather; Lung cancer in her mother.  Constitutional Exam  General appearance: Well nourished, well developed, and well hydrated. In no apparent acute distress Vitals:   01/28/17 1439  BP: (!) 145/81  Pulse: 67  Resp: 16  Temp: 98.2 F (36.8 C)  SpO2: 99%  Weight: 230 lb (104.3 kg)  Height: '5\' 10"'$  (1.778 m)   BMI Assessment: Estimated body mass index is 33 kg/m as calculated from the following:   Height as of this encounter: '5\' 10"'$  (1.778 m).   Weight as of this encounter: 230 lb (104.3 kg).  BMI interpretation table: BMI level Category Range association with higher incidence of chronic pain  <18 kg/m2 Underweight   18.5-24.9 kg/m2 Ideal body weight   25-29.9 kg/m2 Overweight Increased incidence by 20%   30-34.9 kg/m2 Obese (Class I) Increased incidence by 68%  35-39.9 kg/m2 Severe obesity (Class II) Increased incidence by 136%  >40 kg/m2 Extreme obesity (Class III) Increased incidence by 254%   BMI Readings from Last 4 Encounters:  02/01/17 33.00 kg/m  01/31/17 33.00 kg/m  01/28/17 33.00 kg/m  12/11/16 32.28 kg/m   Wt Readings from Last 4 Encounters:  02/01/17 230 lb (104.3 kg)  01/31/17 230 lb (104.3 kg)  01/28/17 230 lb (104.3 kg)  12/11/16 225 lb (102.1 kg)  Psych/Mental status: Alert, oriented x 3 (person, place, & time)       Eyes: PERLA Respiratory: No evidence of acute respiratory distress  Cervical Spine Exam  Inspection: No masses, redness, or swelling Alignment: Symmetrical Functional ROM: Unrestricted ROM      Stability: No instability detected Muscle strength & Tone: Functionally intact Sensory: Unimpaired Palpation: No palpable anomalies              Upper Extremity (UE) Exam    Side: Right upper extremity  Side: Left upper extremity  Inspection: No masses, redness, swelling, or asymmetry. No contractures  Inspection: No masses, redness, swelling, or asymmetry. No contractures  Functional ROM: Unrestricted ROM  Functional ROM: Unrestricted ROM          Muscle strength & Tone: Functionally intact  Muscle strength & Tone: Functionally intact  Sensory: Unimpaired  Sensory: Unimpaired  Palpation: No palpable anomalies              Palpation: No palpable anomalies              Specialized Test(s): Deferred         Specialized Test(s): Deferred          Thoracic Spine Exam  Inspection: No masses, redness, or swelling Alignment: Symmetrical Functional ROM: Unrestricted ROM Stability: No instability detected Sensory: Unimpaired Muscle strength & Tone: No palpable anomalies  Lumbar Spine Exam  Inspection: No masses, redness, or swelling Alignment: Symmetrical Functional ROM: Unrestricted ROM      Stability: No instability detected Muscle strength &  Tone: Functionally intact Sensory: Unimpaired Palpation: No palpable anomalies       Provocative Tests: Lumbar Hyperextension and rotation test: evaluation deferred today       Lumbar Lateral bending test: evaluation deferred today       Patrick's Maneuver: evaluation deferred today                    Gait & Posture Assessment  Ambulation: Unassisted Gait: Relatively normal for age and body habitus Posture: WNL   Lower Extremity Exam    Side: Right lower extremity  Side: Left lower extremity  Inspection: Evidence of amputation of the big toe with swelling, redness, and possible gangrene of the second toe. Patient pending to be [facility] at [facility] for further evaluation by a podiatrist.  Inspection: No masses, redness, swelling, or asymmetry. No contractures  Functional ROM: Unrestricted ROM          Functional ROM: Unrestricted ROM          Muscle strength & Tone: Functionally intact  Muscle strength & Tone: Functionally intact  Sensory: Unimpaired  Sensory: Unimpaired  Palpation: No palpable anomalies  Palpation: No palpable anomalies   Assessment  Primary Diagnosis & Pertinent Problem List: The primary encounter diagnosis was Chronic pain syndrome. Diagnoses of Failed back surgical syndrome, Primary osteoarthritis involving multiple joints, Fibromyalgia, Musculoskeletal pain, Encounter for adjustment or management of infusion pump, Amputated great toe of right foot (HCC), Long term prescription opiate use, Long term current use of opiate analgesic, and Opiate use were also pertinent to this visit.  Status Diagnosis  Controlled Controlled Controlled 1. Chronic pain syndrome   2. Failed back surgical syndrome   3. Primary osteoarthritis involving multiple joints   4. Fibromyalgia   5. Musculoskeletal pain   6. Encounter for adjustment or management of infusion pump   7. Amputated great toe of right foot (HCC)   8. Long term prescription opiate use   9. Long term  current use of opiate analgesic   10. Opiate use     Problems updated and reviewed during this visit: No problems updated. Plan of Care  Pharmacotherapy (Medications Ordered): Meds ordered this encounter  Medications  . oxyCODONE (OXY IR/ROXICODONE) 5 MG immediate release tablet    Sig: Take 1 tablet (5 mg total) by mouth every 8 (eight) hours as needed for severe pain.    Dispense:  90 tablet    Refill:  0    Do not place this medication, or any other prescription from our practice, on "Automatic Refill". Patient may have prescription filled one day early if pharmacy is  closed on scheduled refill date. Do not fill until: 03/06/17 To last until: 04/05/17  . oxyCODONE (OXY IR/ROXICODONE) 5 MG immediate release tablet    Sig: Take 1 tablet (5 mg total) by mouth every 8 (eight) hours as needed for severe pain.    Dispense:  90 tablet    Refill:  0    Do not place this medication, or any other prescription from our practice, on "Automatic Refill". Patient may have prescription filled one day early if pharmacy is closed on scheduled refill date. Do not fill until: 04/05/17 To last until: 05/05/17  . oxyCODONE (OXY IR/ROXICODONE) 5 MG immediate release tablet    Sig: Take 1 tablet (5 mg total) by mouth every 8 (eight) hours as needed for severe pain.    Dispense:  90 tablet    Refill:  0    Do not place this medication, or any other prescription from our practice, on "Automatic Refill". Patient may have prescription filled one day early if pharmacy is closed on scheduled refill date. Do not fill until: 02/04/17 To last until: 03/06/17  . pregabalin (LYRICA) 100 MG capsule    Sig: Take 1 capsule (100 mg total) by mouth 3 (three) times daily.    Dispense:  270 capsule    Refill:  0    Do not place this medication, or any other prescription from our practice, on "Automatic Refill". Patient may have prescription filled one day early if pharmacy is closed on scheduled refill date.  Marland Kitchen  tiZANidine (ZANAFLEX) 4 MG capsule    Sig: Take 1 capsule (4 mg total) by mouth 3 (three) times daily.    Dispense:  270 capsule    Refill:  0    Do not add this medication to the electronic "Automatic Refill" notification system. Patient may have prescription filled one day early if pharmacy is closed on scheduled refill date.  . meloxicam (MOBIC) 15 MG tablet    Sig: Take 1 tablet (15 mg total) by mouth daily.    Dispense:  90 tablet    Refill:  0    Do not place medication on "Automatic Refill". Fill one day early if pharmacy is closed on scheduled refill date.   New Prescriptions   OXYCODONE (ROXICODONE) 5 MG IMMEDIATE RELEASE TABLET    Take 1 tablet (5 mg total) by mouth every 4 (four) hours as needed for severe pain.   Medications administered today: Ms. Tapscott had no medications administered during this visit.  Lab-work, procedure(s), and/or referral(s): No orders of the defined types were placed in this encounter.   Interventional management options: Planned, scheduled, and/or pending:   Intrathecal pump refill.    Considering:   Diagnostic Right knee genicular nerve blocks  Possible right knee genicular RFA  Palliative cervical epidural steroid injection  Diagnostic cervical facet block  Possible bilateral cervical facet radiofrequency ablation    Palliative PRN treatment(s):   Cervical epidural steroid injection  Right diagnostic genicular nerve block    Provider-requested follow-up: Return in about 3 months (around 05/02/2017) for Med-Mgmt by Thad Ranger, NP.  Future Appointments Date Time Provider Department Center  04/02/2017 11:30 AM Delano Metz, MD [facility] None  05/02/2017 2:30 PM Barbette Merino, NP [facility] None   Primary Care Physician: Lorre Munroe, NP Location: [facility] Note by: Oswaldo Done, MD Date: 01/28/2017; Time: 3:05 PM  Patient Instructions  Prescriptions for Zanaflex and Meloxicam were sent  to your pharmacy. You were  given one prescription for Lyrica and 3 for Oxycodone.

## 2017-01-29 ENCOUNTER — Encounter: Payer: Medicare Other | Admitting: Pain Medicine

## 2017-01-30 DIAGNOSIS — I96 Gangrene, not elsewhere classified: Secondary | ICD-10-CM | POA: Diagnosis not present

## 2017-01-30 DIAGNOSIS — L97512 Non-pressure chronic ulcer of other part of right foot with fat layer exposed: Secondary | ICD-10-CM | POA: Diagnosis not present

## 2017-01-30 DIAGNOSIS — E1142 Type 2 diabetes mellitus with diabetic polyneuropathy: Secondary | ICD-10-CM | POA: Diagnosis not present

## 2017-01-31 ENCOUNTER — Encounter
Admission: RE | Admit: 2017-01-31 | Discharge: 2017-01-31 | Disposition: A | Discharge status: Home / Self Care | Payer: Medicare Other | Source: Ambulatory Visit | Attending: Podiatry | Admitting: Podiatry

## 2017-01-31 DIAGNOSIS — K589 Irritable bowel syndrome without diarrhea: Secondary | ICD-10-CM | POA: Diagnosis not present

## 2017-01-31 DIAGNOSIS — Z9049 Acquired absence of other specified parts of digestive tract: Secondary | ICD-10-CM | POA: Diagnosis not present

## 2017-01-31 DIAGNOSIS — I96 Gangrene, not elsewhere classified: Secondary | ICD-10-CM | POA: Diagnosis not present

## 2017-01-31 DIAGNOSIS — I25119 Atherosclerotic heart disease of native coronary artery with unspecified angina pectoris: Secondary | ICD-10-CM | POA: Diagnosis not present

## 2017-01-31 DIAGNOSIS — M4800 Spinal stenosis, site unspecified: Secondary | ICD-10-CM | POA: Diagnosis not present

## 2017-01-31 DIAGNOSIS — I472 Ventricular tachycardia: Secondary | ICD-10-CM | POA: Diagnosis not present

## 2017-01-31 DIAGNOSIS — Z955 Presence of coronary angioplasty implant and graft: Secondary | ICD-10-CM | POA: Diagnosis not present

## 2017-01-31 DIAGNOSIS — Z801 Family history of malignant neoplasm of trachea, bronchus and lung: Secondary | ICD-10-CM | POA: Diagnosis not present

## 2017-01-31 DIAGNOSIS — Z8614 Personal history of Methicillin resistant Staphylococcus aureus infection: Secondary | ICD-10-CM | POA: Diagnosis not present

## 2017-01-31 DIAGNOSIS — Z800 Family history of malignant neoplasm of digestive organs: Secondary | ICD-10-CM | POA: Diagnosis not present

## 2017-01-31 DIAGNOSIS — Z833 Family history of diabetes mellitus: Secondary | ICD-10-CM | POA: Diagnosis not present

## 2017-01-31 DIAGNOSIS — Z87891 Personal history of nicotine dependence: Secondary | ICD-10-CM | POA: Diagnosis not present

## 2017-01-31 DIAGNOSIS — E118 Type 2 diabetes mellitus with unspecified complications: Secondary | ICD-10-CM | POA: Diagnosis not present

## 2017-01-31 DIAGNOSIS — G4730 Sleep apnea, unspecified: Secondary | ICD-10-CM | POA: Diagnosis not present

## 2017-01-31 DIAGNOSIS — M797 Fibromyalgia: Secondary | ICD-10-CM | POA: Diagnosis not present

## 2017-01-31 DIAGNOSIS — F418 Other specified anxiety disorders: Secondary | ICD-10-CM | POA: Diagnosis not present

## 2017-01-31 DIAGNOSIS — D649 Anemia, unspecified: Secondary | ICD-10-CM | POA: Diagnosis not present

## 2017-01-31 DIAGNOSIS — M1990 Unspecified osteoarthritis, unspecified site: Secondary | ICD-10-CM | POA: Diagnosis not present

## 2017-01-31 DIAGNOSIS — E785 Hyperlipidemia, unspecified: Secondary | ICD-10-CM | POA: Diagnosis not present

## 2017-01-31 HISTORY — DX: Irritable bowel syndrome, unspecified: K58.9

## 2017-01-31 HISTORY — DX: Anxiety disorder, unspecified: F41.9

## 2017-01-31 HISTORY — DX: Depression, unspecified: F32.A

## 2017-01-31 HISTORY — DX: Major depressive disorder, single episode, unspecified: F32.9

## 2017-01-31 LAB — SURGICAL PCR SCREEN
MRSA, PCR: NEGATIVE
Staphylococcus aureus: NEGATIVE

## 2017-01-31 LAB — CBC
HCT: 36.9 % (ref 35.0–47.0)
Hemoglobin: 12.8 g/dL (ref 12.0–16.0)
MCH: 28.7 pg (ref 26.0–34.0)
MCHC: 34.6 g/dL (ref 32.0–36.0)
MCV: 82.9 fL (ref 80.0–100.0)
Platelets: 224 10*3/uL (ref 150–440)
RBC: 4.45 MIL/uL (ref 3.80–5.20)
RDW: 13.1 % (ref 11.5–14.5)
WBC: 6.6 10*3/uL (ref 3.6–11.0)

## 2017-01-31 LAB — DIFFERENTIAL
Basophils Absolute: 0 10*3/uL (ref 0–0.1)
Basophils Relative: 1 %
Eosinophils Absolute: 0.2 10*3/uL (ref 0–0.7)
Eosinophils Relative: 3 %
Lymphocytes Relative: 25 %
Lymphs Abs: 1.6 10*3/uL (ref 1.0–3.6)
Monocytes Absolute: 0.5 10*3/uL (ref 0.2–0.9)
Monocytes Relative: 7 %
Neutro Abs: 4.2 10*3/uL (ref 1.4–6.5)
Neutrophils Relative %: 64 %

## 2017-01-31 LAB — BASIC METABOLIC PANEL
Anion gap: 7 (ref 5–15)
BUN: 22 mg/dL — ABNORMAL HIGH (ref 6–20)
CO2: 30 mmol/L (ref 22–32)
Calcium: 9.4 mg/dL (ref 8.9–10.3)
Chloride: 104 mmol/L (ref 101–111)
Creatinine, Ser: 1.05 mg/dL — ABNORMAL HIGH (ref 0.44–1.00)
GFR calc Af Amer: >60 mL/min (ref >60)
GFR calc non Af Amer: 57 mL/min — ABNORMAL LOW (ref >60)
Glucose, Bld: 111 mg/dL — ABNORMAL HIGH (ref 65–99)
Potassium: 4.2 mmol/L (ref 3.5–5.1)
Sodium: 141 mmol/L (ref 135–145)

## 2017-01-31 MED ORDER — VANCOMYCIN HCL 10 G IV SOLR
1500.0000 mg | Freq: Once | INTRAVENOUS | Status: AC
  Administered 2017-02-01: 1500 mg via INTRAVENOUS
  Filled 2017-01-31: qty 1500

## 2017-01-31 NOTE — Patient Instructions (Signed)
  Your procedure is scheduled on: February 01, 2017 (FRIDAY) Report to Same Day Surgery 2nd floor medical mall ([place] Entrance-take elevator on left to 2nd floor.  Check in with surgery information desk.) ARRIVAL TIME 9:15 AM   Remember: Instructions that are not followed completely may result in serious medical risk, up to and including death, or upon the discretion of your surgeon and anesthesiologist your surgery may need to be rescheduled.    _x___ 1. Do not eat food or drink liquids after midnight. No gum chewing or hard candies                              __x__ 2. No Alcohol for 24 hours before or after surgery.   __x__3. No Smoking for 24 prior to surgery.   ____  4. Bring all medications with you on the day of surgery if instructed.    __x__ 5. Notify your doctor if there is any change in your medical condition     (cold, fever, infections).     Do not wear jewelry, make-up, hairpins, clips or nail polish.  Do not wear lotions, powders, or perfumes.   Do not shave 48 hours prior to surgery. Men may shave face and neck.  Do not bring valuables to the hospital.    [facility] is not responsible for any belongings or valuables.               Contacts, dentures or bridgework may not be worn into surgery.  Leave your suitcase in the car. After surgery it may be brought to your room.  For patients admitted to the hospital, discharge time is determined by your  treatment team                       Patients discharged the day of surgery will not be allowed to drive home.  You will need someone to drive you home and stay with you the night of your procedure.    Please read over the following fact sheets that you were given:   [facility] Preparing for Surgery and or MRSA Information   TAKE THE FOLLOWING MEDICATIONS THE MORNING OF SURGERY WITH A SIP OF WATER :  1. CRESTOR  2. BUPROPION   3. LYRICA  4. PAXIL.  PAIN PUMP AS USUAL  ____Fleets enema or Magnesium Citrate as  directed.   _x___ Use CHG Soap or sage wipes as directed on instruction sheet   ____ Use inhalers on the day of surgery and bring to hospital day of surgery  ____ Stop Metformin and Janumet 2 days prior to surgery.    ____ Take 1/2 of usual insulin dose the night before surgery and none on the morning     surgery.   _x___ Follow recommendations from Cardiologist, Pulmonologist or PCP regarding          stopping Aspirin, Coumadin, Plavix ,Eliquis, Effient, or Pradaxa, and Pletal. (STOP ASPIRIN  TODAY )  X____Stop Anti-inflammatories such as Advil, Aleve, Ibuprofen, Motrin, Naproxen, Naprosyn, Goodies powders or aspirin products. OK to take Tylenol (STOP MELOXICAM AND IBUPROFEN TODAY )   _x___ Stop supplements until after surgery.  But may continue Vitamin D, Vitamin B, and multivitamin (STOP ALL SUPPLEMENTS TODAY )      _x___ Bring C-Pap to the hospital.

## 2017-02-01 ENCOUNTER — Ambulatory Visit: Payer: Medicare Other | Admitting: Certified Registered Nurse Anesthetist

## 2017-02-01 ENCOUNTER — Encounter: Payer: Self-pay | Admitting: *Deleted

## 2017-02-01 ENCOUNTER — Ambulatory Visit
Admission: RE | Admit: 2017-02-01 | Discharge: 2017-02-01 | Disposition: A | Discharge status: Home / Self Care | Payer: Medicare Other | Source: Ambulatory Visit | Attending: Podiatry | Admitting: Podiatry

## 2017-02-01 ENCOUNTER — Encounter
Admission: RE | Disposition: A | Discharge status: Home / Self Care | Payer: Self-pay | Source: Ambulatory Visit | Attending: Podiatry

## 2017-02-01 ENCOUNTER — Telehealth: Payer: Self-pay | Admitting: *Deleted

## 2017-02-01 DIAGNOSIS — Z8614 Personal history of Methicillin resistant Staphylococcus aureus infection: Secondary | ICD-10-CM | POA: Insufficient documentation

## 2017-02-01 DIAGNOSIS — K589 Irritable bowel syndrome without diarrhea: Secondary | ICD-10-CM | POA: Insufficient documentation

## 2017-02-01 DIAGNOSIS — F418 Other specified anxiety disorders: Secondary | ICD-10-CM | POA: Insufficient documentation

## 2017-02-01 DIAGNOSIS — M1990 Unspecified osteoarthritis, unspecified site: Secondary | ICD-10-CM | POA: Insufficient documentation

## 2017-02-01 DIAGNOSIS — G4730 Sleep apnea, unspecified: Secondary | ICD-10-CM | POA: Insufficient documentation

## 2017-02-01 DIAGNOSIS — I472 Ventricular tachycardia: Secondary | ICD-10-CM | POA: Insufficient documentation

## 2017-02-01 DIAGNOSIS — Z955 Presence of coronary angioplasty implant and graft: Secondary | ICD-10-CM | POA: Insufficient documentation

## 2017-02-01 DIAGNOSIS — I96 Gangrene, not elsewhere classified: Secondary | ICD-10-CM | POA: Insufficient documentation

## 2017-02-01 DIAGNOSIS — E119 Type 2 diabetes mellitus without complications: Secondary | ICD-10-CM | POA: Diagnosis not present

## 2017-02-01 DIAGNOSIS — E118 Type 2 diabetes mellitus with unspecified complications: Secondary | ICD-10-CM | POA: Insufficient documentation

## 2017-02-01 DIAGNOSIS — I25119 Atherosclerotic heart disease of native coronary artery with unspecified angina pectoris: Secondary | ICD-10-CM | POA: Diagnosis not present

## 2017-02-01 DIAGNOSIS — Z800 Family history of malignant neoplasm of digestive organs: Secondary | ICD-10-CM | POA: Insufficient documentation

## 2017-02-01 DIAGNOSIS — Z87891 Personal history of nicotine dependence: Secondary | ICD-10-CM | POA: Insufficient documentation

## 2017-02-01 DIAGNOSIS — Z9049 Acquired absence of other specified parts of digestive tract: Secondary | ICD-10-CM | POA: Insufficient documentation

## 2017-02-01 DIAGNOSIS — Z833 Family history of diabetes mellitus: Secondary | ICD-10-CM | POA: Insufficient documentation

## 2017-02-01 DIAGNOSIS — E785 Hyperlipidemia, unspecified: Secondary | ICD-10-CM | POA: Insufficient documentation

## 2017-02-01 DIAGNOSIS — D649 Anemia, unspecified: Secondary | ICD-10-CM | POA: Insufficient documentation

## 2017-02-01 DIAGNOSIS — M797 Fibromyalgia: Secondary | ICD-10-CM | POA: Insufficient documentation

## 2017-02-01 DIAGNOSIS — Z801 Family history of malignant neoplasm of trachea, bronchus and lung: Secondary | ICD-10-CM | POA: Insufficient documentation

## 2017-02-01 DIAGNOSIS — M4800 Spinal stenosis, site unspecified: Secondary | ICD-10-CM | POA: Insufficient documentation

## 2017-02-01 HISTORY — PX: AMPUTATION TOE: SHX6595

## 2017-02-01 LAB — GLUCOSE, CAPILLARY
Glucose-Capillary: 153 mg/dL — ABNORMAL HIGH (ref 65–99)
Glucose-Capillary: 116 mg/dL — ABNORMAL HIGH (ref 65–99)

## 2017-02-01 SURGERY — AMPUTATION, TOE
Anesthesia: General | Laterality: Right | Wound class: Clean Contaminated

## 2017-02-01 MED ORDER — LIDOCAINE HCL 1 % IJ SOLN
INTRAMUSCULAR | Status: DC | PRN
  Administered 2017-02-01: 3 mL

## 2017-02-01 MED ORDER — ONDANSETRON HCL 4 MG/2ML IJ SOLN
INTRAMUSCULAR | Status: DC | PRN
Start: 2017-02-01 — End: 2017-02-01
  Administered 2017-02-01: 4 mg via INTRAVENOUS

## 2017-02-01 MED ORDER — BUPIVACAINE HCL 0.5 % IJ SOLN
INTRAMUSCULAR | Status: DC | PRN
  Administered 2017-02-01: 10 mL

## 2017-02-01 MED ORDER — MIDAZOLAM HCL 2 MG/2ML IJ SOLN
INTRAMUSCULAR | Status: DC | PRN
  Administered 2017-02-01: 2 mg via INTRAVENOUS

## 2017-02-01 MED ORDER — LIDOCAINE HCL (PF) 1 % IJ SOLN
INTRAMUSCULAR | Status: AC
  Filled 2017-02-01: qty 30

## 2017-02-01 MED ORDER — PROPOFOL 10 MG/ML IV BOLUS
INTRAVENOUS | Status: AC
  Filled 2017-02-01: qty 20

## 2017-02-01 MED ORDER — BUPIVACAINE HCL (PF) 0.5 % IJ SOLN
INTRAMUSCULAR | Status: AC
  Filled 2017-02-01: qty 30

## 2017-02-01 MED ORDER — OXYCODONE HCL 5 MG PO TABS: 5.0000 mg | ORAL_TABLET | ORAL | 0 refills | Status: DC | PRN

## 2017-02-01 MED ORDER — FENTANYL CITRATE (PF) 100 MCG/2ML IJ SOLN
INTRAMUSCULAR | Status: DC | PRN
  Administered 2017-02-01 (×2): 25 ug via INTRAVENOUS

## 2017-02-01 MED ORDER — FAMOTIDINE 20 MG PO TABS
ORAL_TABLET | ORAL | Status: AC
  Filled 2017-02-01: qty 1

## 2017-02-01 MED ORDER — FENTANYL CITRATE (PF) 100 MCG/2ML IJ SOLN: 25.0000 ug | INTRAMUSCULAR | Status: DC | PRN

## 2017-02-01 MED ORDER — PROPOFOL 10 MG/ML IV BOLUS
INTRAVENOUS | Status: DC | PRN
  Administered 2017-02-01: 200 mg via INTRAVENOUS

## 2017-02-01 MED ORDER — MIDAZOLAM HCL 2 MG/2ML IJ SOLN
INTRAMUSCULAR | Status: AC
  Filled 2017-02-01: qty 2

## 2017-02-01 MED ORDER — FENTANYL CITRATE (PF) 100 MCG/2ML IJ SOLN
INTRAMUSCULAR | Status: AC
  Filled 2017-02-01: qty 2

## 2017-02-01 MED ORDER — FAMOTIDINE 20 MG PO TABS
20.0000 mg | ORAL_TABLET | Freq: Once | ORAL | Status: AC
  Administered 2017-02-01: 20 mg via ORAL

## 2017-02-01 MED ORDER — PHENYLEPHRINE HCL 10 MG/ML IJ SOLN
INTRAMUSCULAR | Status: DC | PRN
  Administered 2017-02-01 (×3): 150 ug via INTRAVENOUS
  Administered 2017-02-01: 100 ug via INTRAVENOUS
  Administered 2017-02-01: 50 ug via INTRAVENOUS
  Administered 2017-02-01: 150 ug via INTRAVENOUS

## 2017-02-01 MED ORDER — LIDOCAINE HCL (CARDIAC) 20 MG/ML IV SOLN
INTRAVENOUS | Status: DC | PRN
  Administered 2017-02-01: 100 mg via INTRAVENOUS

## 2017-02-01 MED ORDER — GLYCOPYRROLATE 0.2 MG/ML IJ SOLN
INTRAMUSCULAR | Status: DC | PRN
  Administered 2017-02-01: 0.2 mg via INTRAVENOUS

## 2017-02-01 MED ORDER — SODIUM CHLORIDE 0.9 % IV SOLN
INTRAVENOUS | Status: DC
  Administered 2017-02-01: 10:00:00 via INTRAVENOUS

## 2017-02-01 MED ORDER — ONDANSETRON HCL 4 MG/2ML IJ SOLN: 4.0000 mg | Freq: Once | INTRAMUSCULAR | Status: DC | PRN

## 2017-02-01 SURGICAL SUPPLY — 37 items
BANDAGE ELASTIC 4 LF NS (GAUZE/BANDAGES/DRESSINGS) ×2 IMPLANT
BANDAGE STRETCH 3X4.1 STRL (GAUZE/BANDAGES/DRESSINGS) ×2 IMPLANT
BLADE MED AGGRESSIVE (BLADE) ×1 IMPLANT
BLADE SURG 15 STRL LF DISP TIS (BLADE) ×4 IMPLANT
BLADE SURG 15 STRL SS (BLADE) ×2
BNDG CMPR MED 5X4 ELC HKLP NS (GAUZE/BANDAGES/DRESSINGS) ×1
BNDG ESMARK 4X12 TAN STRL LF (GAUZE/BANDAGES/DRESSINGS) ×2 IMPLANT
BNDG GAUZE 4.5X4.1 6PLY STRL (MISCELLANEOUS) ×2 IMPLANT
CANISTER SUCT 1200ML W/VALVE (MISCELLANEOUS) ×2 IMPLANT
CUFF TOURN 18 STER (MISCELLANEOUS) ×1 IMPLANT
CUFF TOURN DUAL PL 12 NO SLV (MISCELLANEOUS) ×1 IMPLANT
DRAPE FLUOR MINI C-ARM 54X84 (DRAPES) ×1 IMPLANT
DURAPREP 26ML APPLICATOR (WOUND CARE) ×2 IMPLANT
ELECT REM PT RETURN 9FT ADLT (ELECTROSURGICAL) ×2
ELECTRODE REM PT RTRN 9FT ADLT (ELECTROSURGICAL) ×1 IMPLANT
GAUZE PETRO XEROFOAM 1X8 (MISCELLANEOUS) ×2 IMPLANT
GAUZE SPONGE 4X4 12PLY STRL (GAUZE/BANDAGES/DRESSINGS) ×2 IMPLANT
GLOVE BIO SURGEON STRL SZ8 (GLOVE) ×2 IMPLANT
GLOVE INDICATOR 8.0 STRL GRN (GLOVE) ×2 IMPLANT
GOWN STRL REUS W/ TWL LRG LVL3 (GOWN DISPOSABLE) ×2 IMPLANT
GOWN STRL REUS W/TWL LRG LVL3 (GOWN DISPOSABLE) ×4
HEMOSTAT SURGICEL 2X3 (HEMOSTASIS) ×1 IMPLANT
KIT RM TURNOVER STRD PROC AR (KITS) ×2 IMPLANT
LABEL OR SOLS (LABEL) ×2 IMPLANT
NDL HYPO 25X1 1.5 SAFETY (NEEDLE) ×3 IMPLANT
NDL SAFETY 18GX1.5 (NEEDLE) ×2 IMPLANT
NEEDLE HYPO 25X1 1.5 SAFETY (NEEDLE) ×6 IMPLANT
NS IRRIG 500ML POUR BTL (IV SOLUTION) ×2 IMPLANT
PACK EXTREMITY ARMC (MISCELLANEOUS) ×2 IMPLANT
PENCIL ELECTRO HAND CTR (MISCELLANEOUS) ×1 IMPLANT
RASP SM TEAR CROSS CUT (RASP) ×1 IMPLANT
STOCKINETTE STRL 6IN 960660 (GAUZE/BANDAGES/DRESSINGS) ×2 IMPLANT
SUT ETH BLK MONO 3 0 FS 1 12/B (SUTURE) ×1 IMPLANT
SUT ETHILON 5 0 PS 2 18 (SUTURE) ×2 IMPLANT
SUT VIC AB 4-0 FS2 27 (SUTURE) ×1 IMPLANT
SWAB CULTURE AMIES ANAERIB BLU (MISCELLANEOUS) ×2 IMPLANT
SYRINGE 10CC LL (SYRINGE) ×2 IMPLANT

## 2017-02-01 NOTE — Op Note (Signed)
Operative note   Surgeon: Dr. Albertine Patricia, DPM.    Assistant: None    Preop diagnosis: Gangrene distal second toe right foot    Postop diagnosis: Same    Procedure:   1. Amputation second toe right foot to the MTPJ level           EBL: 30 cc    Anesthesia:general via LMA [facility] anesthesia team. I blocked her surgical site with 7 cc of 1% lidocaine plain and 0.5% Marcaine plain mixed prior to the surgery and with 6 cc of 0.5% Marcaine plain postoperatively.    Hemostasis: Ankle tourniquet 225 m mercury pressure for 15 minutes and tourniquet was released    Specimen: Gangrenous second toe right foot    Complications: None patient had a great toe amputation number of years ago gradually developed more deformity to the right second toe irritation pressure and chronic ulceration lead to gangrenous changes to the distal toe.    Operative indications: Gangrene distal right second toe including the IP joint distally    Procedure:  Patient was brought into the OR and placed on the operating table in thesupine position. After anesthesia was obtained theright lower extremity was prepped and draped in usual sterile fashion.  Operative Report: This time to his directed to the second toe of the right foot 2 semielliptical incisions were made around the toe and deepened down to bone and joint level. This allowed for disarticulation of the second toe from the metatarsal and soft tissue cleaning Tissue and tendon and muscle structures were released. The tendon was removed. This point there was copiously irrigated and tourniquet was released. Bleeders were encountered at dorsal and plantar mediolateral areas so proximally for different areas were clamped and tied off and bovied as well. Once this was accomplished the bleeding was under good control. There is an copiously irrigated again some capillary bleeding was occurring and small vein bleeding was occurring so we applied some Surgicel and to  the region also. After copious irrigation the capsular and deep fascial tissues were closed with 3-0 Vicryl simple interrupted sutures. Skin was closed with 3-0 nylon simple interrupted and horizontal mattress combination.  A sterile compressive dressings were placed across wound consisting of sterile gauze 4 x 4's,  Kling and Kerlix and an Ace wrap..     Patient tolerated the procedure and anesthesia well.  Was transported from the OR to the PACU with all vital signs stable and vascular status intact. To be discharged per routine protocol.  Will follow up in approximately 1 week in the outpatient clinic.

## 2017-02-01 NOTE — Anesthesia Postprocedure Evaluation (Signed)
Anesthesia Post Note  Patient: Melinda [name]  Procedure(s) Performed: Procedure(s) (LRB): AMPUTATION TOE-RIGHT 2ND MPJ (Right)  Patient location during evaluation: PACU Anesthesia Type: General Level of consciousness: awake and alert and oriented Pain management: pain level controlled Vital Signs Assessment: post-procedure vital signs reviewed and stable Respiratory status: spontaneous breathing Cardiovascular status: blood pressure returned to baseline Anesthetic complications: no     Last Vitals:  Vitals:   02/01/17 1227 02/01/17 1256  BP: 137/69 139/75  Pulse: 72 64  Resp: 16   Temp: 36.5 C     Last Pain:  Vitals:   02/01/17 1227  TempSrc: Oral  PainSc:                  Beau Vanduzer

## 2017-02-01 NOTE — Transfer of Care (Signed)
Immediate Anesthesia Transfer of Care Note  Patient: Melinda [name]  Procedure(s) Performed: Procedure(s): AMPUTATION TOE-RIGHT 2ND MPJ (Right)  Patient Location: PACU  Anesthesia Type:General  Level of Consciousness: sedated  Airway & Oxygen Therapy: Patient Spontanous Breathing and Patient connected to face mask oxygen  Post-op Assessment: Report given to RN and Post -op Vital signs reviewed and stable  Post vital signs: Reviewed and stable  Last Vitals:  Vitals:   02/01/17 0917  BP: 120/67  Pulse: 84  Resp: 16  Temp: 36.4 C    Last Pain:  Vitals:   02/01/17 0917  TempSrc: Tympanic  PainSc: 2          Complications: No apparent anesthesia complications

## 2017-02-01 NOTE — H&P (Signed)
H and P has been reviewed and no changes are noted.  

## 2017-02-01 NOTE — Anesthesia Preprocedure Evaluation (Signed)
Anesthesia Evaluation  Patient identified by MRN, date of birth, ID band Patient awake    Reviewed: Allergy & Precautions, NPO status , Patient's Chart, lab work & pertinent test results  Airway Mallampati: II  TM Distance: >3 FB Neck ROM: Full    Dental  (+) Teeth Intact   Pulmonary sleep apnea and Continuous Positive Airway Pressure Ventilation , former smoker,           Cardiovascular + angina + CAD and + Cardiac Stents  + dysrhythmias Supra Ventricular Tachycardia      Neuro/Psych Anxiety Depression  Neuromuscular disease    GI/Hepatic negative GI ROS, Neg liver ROS,   Endo/Other  diabetes, Type 2  Renal/GU negative Renal ROS     Musculoskeletal  (+) Arthritis , Fibromyalgia -, narcotic dependent  Abdominal   Peds  Hematology negative hematology ROS (+) anemia ,   Anesthesia Other Findings   Reproductive/Obstetrics                             Anesthesia Physical  Anesthesia Plan  ASA: III  Anesthesia Plan: General   Post-op Pain Management:    Induction: Intravenous  PONV Risk Score and Plan:   Airway Management Planned: LMA  Additional Equipment:   Intra-op Plan:   Post-operative Plan: Extubation in OR  Informed Consent: I have reviewed the patients History and Physical, chart, labs and discussed the procedure including the risks, benefits and alternatives for the proposed anesthesia with the patient or authorized representative who has indicated his/her understanding and acceptance.     Plan Discussed with: CRNA  Anesthesia Plan Comments:         Anesthesia Quick Evaluation

## 2017-02-01 NOTE — Progress Notes (Signed)
Went over discharge instructions with pt and family member and they state understanding

## 2017-02-01 NOTE — Anesthesia Post-op Follow-up Note (Cosign Needed)
Anesthesia QCDR form completed.        

## 2017-02-01 NOTE — Anesthesia Procedure Notes (Signed)
Procedure Name: LMA Insertion Performed by: Demetrius Charity Pre-anesthesia Checklist: Patient identified, Patient being monitored, Timeout performed, Emergency Drugs available and Suction available Patient Re-evaluated:Patient Re-evaluated prior to induction Oxygen Delivery Method: Circle system utilized Preoxygenation: Pre-oxygenation with 100% oxygen Induction Type: IV induction Ventilation: Mask ventilation without difficulty LMA: LMA inserted LMA Size: 4.0 Tube type: Oral Number of attempts: 1 Placement Confirmation: positive ETCO2 and breath sounds checked- equal and bilateral Tube secured with: Tape Dental Injury: Teeth and Oropharynx as per pre-operative assessment

## 2017-02-01 NOTE — Discharge Instructions (Signed)
[facility] °[facility] ° °POST OPERATIVE INSTRUCTIONS FOR DR. Lunette Tapp AND DR. FOWLER °[facility] ° ° °1. Take your medication as prescribed.  Pain medication should be taken only as needed. ° °2. Keep the dressing clean, dry and intact. ° °3. Keep your foot elevated above the heart level for the first 48 hours. ° °4. Walking to the bathroom and brief periods of walking are acceptable, unless we have instructed you to be non-weight bearing. ° °5. Always wear your post-op shoe when walking.  Always use your crutches if you are to be non-weight bearing. ° °6. Do not take a shower. Baths are permissible as long as the foot is kept out of the water.  ° °7. Every hour you are awake:  °- Bend your knee 15 times. °- Flex foot 15 times °- Massage calf 15 times ° °8. Call [facility] ([phone]) if any of the following problems occur: °- You develop a temperature or fever. °- The bandage becomes saturated with blood. °- Medication does not stop your pain. °- Injury of the foot occurs. °- Any symptoms of infection including redness, odor, or red streaks running from wound. °-  ° °

## 2017-02-04 NOTE — Telephone Encounter (Signed)
Faxed surgeon information.

## 2017-02-05 LAB — SURGICAL PATHOLOGY

## 2017-02-20 ENCOUNTER — Telehealth: Payer: Self-pay | Admitting: Pain Medicine

## 2017-02-20 DIAGNOSIS — M79671 Pain in right foot: Secondary | ICD-10-CM | POA: Diagnosis not present

## 2017-02-20 NOTE — Telephone Encounter (Signed)
FYI, patient had foot surgery with Dr. Albertine Patricia, he wrote for 30 Oxycodone 5mg  1 every 4 hours for severe pain. She did not fill until 8-20, she used her meds from Dr. Dossie Arbour. Wanted to let us know.

## 2017-02-22 ENCOUNTER — Inpatient Hospital Stay: Payer: Medicare Other

## 2017-02-22 ENCOUNTER — Inpatient Hospital Stay
Admission: AD | Admit: 2017-02-22 | Discharge: 2017-02-25 | DRG: 504 | Disposition: A | Discharge status: Home Health | Payer: Medicare Other | Source: Ambulatory Visit | Attending: Internal Medicine | Admitting: Internal Medicine

## 2017-02-22 DIAGNOSIS — M797 Fibromyalgia: Secondary | ICD-10-CM | POA: Diagnosis present

## 2017-02-22 DIAGNOSIS — E785 Hyperlipidemia, unspecified: Secondary | ICD-10-CM | POA: Diagnosis present

## 2017-02-22 DIAGNOSIS — Z8249 Family history of ischemic heart disease and other diseases of the circulatory system: Secondary | ICD-10-CM

## 2017-02-22 DIAGNOSIS — G4730 Sleep apnea, unspecified: Secondary | ICD-10-CM | POA: Diagnosis present

## 2017-02-22 DIAGNOSIS — M545 Low back pain: Secondary | ICD-10-CM | POA: Diagnosis present

## 2017-02-22 DIAGNOSIS — G8929 Other chronic pain: Secondary | ICD-10-CM | POA: Diagnosis present

## 2017-02-22 DIAGNOSIS — E1140 Type 2 diabetes mellitus with diabetic neuropathy, unspecified: Secondary | ICD-10-CM | POA: Diagnosis present

## 2017-02-22 DIAGNOSIS — T8743 Infection of amputation stump, right lower extremity: Principal | ICD-10-CM | POA: Diagnosis present

## 2017-02-22 DIAGNOSIS — Z955 Presence of coronary angioplasty implant and graft: Secondary | ICD-10-CM | POA: Diagnosis not present

## 2017-02-22 DIAGNOSIS — Z833 Family history of diabetes mellitus: Secondary | ICD-10-CM | POA: Diagnosis not present

## 2017-02-22 DIAGNOSIS — Z89421 Acquired absence of other right toe(s): Secondary | ICD-10-CM

## 2017-02-22 DIAGNOSIS — I96 Gangrene, not elsewhere classified: Secondary | ICD-10-CM | POA: Diagnosis not present

## 2017-02-22 DIAGNOSIS — Z87891 Personal history of nicotine dependence: Secondary | ICD-10-CM

## 2017-02-22 DIAGNOSIS — Z9049 Acquired absence of other specified parts of digestive tract: Secondary | ICD-10-CM

## 2017-02-22 DIAGNOSIS — L02611 Cutaneous abscess of right foot: Secondary | ICD-10-CM | POA: Diagnosis not present

## 2017-02-22 DIAGNOSIS — E119 Type 2 diabetes mellitus without complications: Secondary | ICD-10-CM | POA: Diagnosis not present

## 2017-02-22 DIAGNOSIS — T8781 Dehiscence of amputation stump: Secondary | ICD-10-CM | POA: Diagnosis present

## 2017-02-22 DIAGNOSIS — L03031 Cellulitis of right toe: Secondary | ICD-10-CM | POA: Diagnosis present

## 2017-02-22 DIAGNOSIS — M1990 Unspecified osteoarthritis, unspecified site: Secondary | ICD-10-CM | POA: Diagnosis not present

## 2017-02-22 DIAGNOSIS — L0390 Cellulitis, unspecified: Secondary | ICD-10-CM | POA: Diagnosis not present

## 2017-02-22 DIAGNOSIS — Z89431 Acquired absence of right foot: Secondary | ICD-10-CM | POA: Diagnosis not present

## 2017-02-22 DIAGNOSIS — Z7982 Long term (current) use of aspirin: Secondary | ICD-10-CM | POA: Diagnosis not present

## 2017-02-22 DIAGNOSIS — Z801 Family history of malignant neoplasm of trachea, bronchus and lung: Secondary | ICD-10-CM | POA: Diagnosis not present

## 2017-02-22 DIAGNOSIS — F419 Anxiety disorder, unspecified: Secondary | ICD-10-CM | POA: Diagnosis not present

## 2017-02-22 DIAGNOSIS — K5900 Constipation, unspecified: Secondary | ICD-10-CM | POA: Diagnosis present

## 2017-02-22 DIAGNOSIS — L02619 Cutaneous abscess of unspecified foot: Secondary | ICD-10-CM

## 2017-02-22 DIAGNOSIS — M7989 Other specified soft tissue disorders: Secondary | ICD-10-CM | POA: Diagnosis present

## 2017-02-22 DIAGNOSIS — I2510 Atherosclerotic heart disease of native coronary artery without angina pectoris: Secondary | ICD-10-CM | POA: Diagnosis present

## 2017-02-22 DIAGNOSIS — F329 Major depressive disorder, single episode, unspecified: Secondary | ICD-10-CM | POA: Diagnosis not present

## 2017-02-22 DIAGNOSIS — L03115 Cellulitis of right lower limb: Secondary | ICD-10-CM | POA: Diagnosis not present

## 2017-02-22 DIAGNOSIS — Y835 Amputation of limb(s) as the cause of abnormal reaction of the patient, or of later complication, without mention of misadventure at the time of the procedure: Secondary | ICD-10-CM | POA: Diagnosis present

## 2017-02-22 DIAGNOSIS — M869 Osteomyelitis, unspecified: Secondary | ICD-10-CM | POA: Diagnosis present

## 2017-02-22 DIAGNOSIS — G629 Polyneuropathy, unspecified: Secondary | ICD-10-CM | POA: Diagnosis not present

## 2017-02-22 LAB — CREATININE, SERUM
Creatinine, Ser: 0.99 mg/dL (ref 0.44–1.00)
GFR calc Af Amer: >60 mL/min (ref >60)
GFR calc non Af Amer: >60 mL/min (ref >60)

## 2017-02-22 LAB — HEMOGLOBIN A1C
Hgb A1c MFr Bld: 5.8 % — ABNORMAL HIGH (ref 4.8–5.6)
Mean Plasma Glucose: 119.76 mg/dL

## 2017-02-22 LAB — SURGICAL PCR SCREEN
MRSA, PCR: NEGATIVE
Staphylococcus aureus: POSITIVE — AB

## 2017-02-22 LAB — GLUCOSE, CAPILLARY
Glucose-Capillary: 111 mg/dL — ABNORMAL HIGH (ref 65–99)
Glucose-Capillary: 123 mg/dL — ABNORMAL HIGH (ref 65–99)
Glucose-Capillary: 148 mg/dL — ABNORMAL HIGH (ref 65–99)

## 2017-02-22 MED ORDER — MELOXICAM 7.5 MG PO TABS
15.0000 mg | ORAL_TABLET | Freq: Every day | ORAL | Status: DC
  Administered 2017-02-23 – 2017-02-25 (×3): 15 mg via ORAL
  Filled 2017-02-22 (×4): qty 2

## 2017-02-22 MED ORDER — PAROXETINE HCL 20 MG PO TABS
10.0000 mg | ORAL_TABLET | Freq: Every day | ORAL | Status: DC
  Administered 2017-02-23 – 2017-02-25 (×3): 10 mg via ORAL
  Filled 2017-02-22 (×3): qty 1

## 2017-02-22 MED ORDER — ACETAMINOPHEN 500 MG PO TABS: 1000.0000 mg | ORAL_TABLET | Freq: Four times a day (QID) | ORAL | Status: DC | PRN

## 2017-02-22 MED ORDER — VITAMIN D 1000 UNITS PO TABS
2000.0000 [IU] | ORAL_TABLET | Freq: Every day | ORAL | Status: DC
  Administered 2017-02-23 – 2017-02-25 (×3): 2000 [IU] via ORAL
  Filled 2017-02-22 (×3): qty 2

## 2017-02-22 MED ORDER — BUPROPION HCL ER (XL) 150 MG PO TB24
150.0000 mg | ORAL_TABLET | Freq: Every day | ORAL | Status: DC
  Administered 2017-02-23 – 2017-02-25 (×3): 150 mg via ORAL
  Filled 2017-02-22 (×3): qty 1

## 2017-02-22 MED ORDER — OMEGA-3-ACID ETHYL ESTERS 1 G PO CAPS
1.0000 g | ORAL_CAPSULE | Freq: Every day | ORAL | Status: DC
  Administered 2017-02-23 – 2017-02-25 (×3): 1 g via ORAL
  Filled 2017-02-22 (×3): qty 1

## 2017-02-22 MED ORDER — ROSUVASTATIN CALCIUM 10 MG PO TABS
20.0000 mg | ORAL_TABLET | Freq: Every day | ORAL | Status: DC
  Administered 2017-02-22 – 2017-02-24 (×3): 20 mg via ORAL
  Filled 2017-02-22 (×3): qty 2

## 2017-02-22 MED ORDER — ONDANSETRON HCL 4 MG/2ML IJ SOLN: 4.0000 mg | Freq: Four times a day (QID) | INTRAMUSCULAR | Status: DC | PRN

## 2017-02-22 MED ORDER — ADULT MULTIVITAMIN W/MINERALS CH
1.0000 | ORAL_TABLET | Freq: Every day | ORAL | Status: DC
  Administered 2017-02-23 – 2017-02-25 (×3): 1 via ORAL
  Filled 2017-02-22 (×3): qty 1

## 2017-02-22 MED ORDER — PREGABALIN 50 MG PO CAPS
100.0000 mg | ORAL_CAPSULE | Freq: Three times a day (TID) | ORAL | Status: DC
  Administered 2017-02-22 – 2017-02-25 (×9): 100 mg via ORAL
  Filled 2017-02-22 (×10): qty 2

## 2017-02-22 MED ORDER — INSULIN ASPART 100 UNIT/ML ~~LOC~~ SOLN: 0.0000 [IU] | Freq: Every day | SUBCUTANEOUS | Status: DC

## 2017-02-22 MED ORDER — VANCOMYCIN HCL 10 G IV SOLR
1500.0000 mg | Freq: Once | INTRAVENOUS | Status: AC
  Administered 2017-02-22: 1500 mg via INTRAVENOUS
  Filled 2017-02-22: qty 1500

## 2017-02-22 MED ORDER — ONDANSETRON HCL 4 MG PO TABS: 4.0000 mg | ORAL_TABLET | Freq: Four times a day (QID) | ORAL | Status: DC | PRN

## 2017-02-22 MED ORDER — SODIUM CHLORIDE 0.9 % IV SOLN
INTRAVENOUS | Status: DC
  Administered 2017-02-22 – 2017-02-24 (×4): via INTRAVENOUS

## 2017-02-22 MED ORDER — OXYCODONE HCL 5 MG PO TABS
5.0000 mg | ORAL_TABLET | ORAL | Status: DC | PRN
  Administered 2017-02-22 – 2017-02-25 (×9): 5 mg via ORAL
  Filled 2017-02-22 (×9): qty 1

## 2017-02-22 MED ORDER — VANCOMYCIN HCL 10 G IV SOLR
1250.0000 mg | Freq: Two times a day (BID) | INTRAVENOUS | Status: DC
  Administered 2017-02-22 – 2017-02-25 (×5): 1250 mg via INTRAVENOUS
  Filled 2017-02-22 (×10): qty 1250

## 2017-02-22 MED ORDER — NITROGLYCERIN 0.4 MG SL SUBL: 0.4000 mg | SUBLINGUAL_TABLET | SUBLINGUAL | Status: DC | PRN

## 2017-02-22 MED ORDER — VITAMIN E 180 MG (400 UNIT) PO CAPS
400.0000 [IU] | ORAL_CAPSULE | Freq: Every day | ORAL | Status: DC
  Administered 2017-02-23 – 2017-02-25 (×3): 400 [IU] via ORAL
  Filled 2017-02-22 (×4): qty 1

## 2017-02-22 MED ORDER — ENOXAPARIN SODIUM 40 MG/0.4ML ~~LOC~~ SOLN
40.0000 mg | SUBCUTANEOUS | Status: DC
  Administered 2017-02-22 – 2017-02-24 (×3): 40 mg via SUBCUTANEOUS
  Filled 2017-02-22 (×3): qty 0.4

## 2017-02-22 MED ORDER — TIZANIDINE HCL 2 MG PO TABS
4.0000 mg | ORAL_TABLET | Freq: Three times a day (TID) | ORAL | Status: DC
  Administered 2017-02-22 – 2017-02-25 (×9): 4 mg via ORAL
  Filled 2017-02-22 (×13): qty 2

## 2017-02-22 MED ORDER — POLYETHYLENE GLYCOL 3350 17 G PO PACK
17.0000 g | PACK | Freq: Every day | ORAL | Status: DC | PRN
  Administered 2017-02-23: 17 g via ORAL
  Filled 2017-02-22: qty 1

## 2017-02-22 MED ORDER — INSULIN ASPART 100 UNIT/ML ~~LOC~~ SOLN
0.0000 [IU] | Freq: Three times a day (TID) | SUBCUTANEOUS | Status: DC
Start: 2017-02-22 — End: 2017-02-25
  Filled 2017-02-22 (×3): qty 1

## 2017-02-22 NOTE — Progress Notes (Addendum)
ANTIBIOTIC CONSULT NOTE - INITIAL  Pharmacy Consult for vancomycin Indication: osteomyelitis   No Known Allergies  Patient Measurements: Height: 5\' 10"  (177.8 cm) Weight: 230 lb (104.3 kg) IBW/kg (Calculated) : 68.5 Adjusted Body Weight: 82.82 kg  Vital Signs: Temp: 99.1 F (37.3 C) (08/24 1421) Temp Source: Oral (08/24 1421) BP: 142/74 (08/24 1421) Pulse Rate: 75 (08/24 1421) Intake/Output from previous day: No intake/output data recorded. Intake/Output from this shift: Total I/O In: 725 [I.V.:225; IV Piggyback:500] Out: -   Labs:  Recent Labs  02/22/17 1428  CREATININE 0.99   Estimated Creatinine Clearance: 80 mL/min (by C-G formula based on SCr of 0.99 mg/dL). No results for input(s): VANCOTROUGH, VANCOPEAK, VANCORANDOM, GENTTROUGH, GENTPEAK, GENTRANDOM, TOBRATROUGH, TOBRAPEAK, TOBRARND, AMIKACINPEAK, AMIKACINTROU, AMIKACIN in the last 72 hours.   Microbiology: Recent Results (from the past 720 hour(s))  Surgical pcr screen     Status: None   Collection Time: 01/31/17 12:36 PM  Result Value Ref Range Status   MRSA, PCR NEGATIVE NEGATIVE Final   Staphylococcus aureus NEGATIVE NEGATIVE Final    Comment:        The Xpert SA Assay (FDA approved for NASAL specimens in patients over 63 years of age), is one component of a comprehensive surveillance program.  Test performance has been validated by [facility] for patients greater than or equal to 8 year old. It is not intended to diagnose infection nor to guide or monitor treatment.     Medical History: Past Medical History:  Diagnosis Date  . Anemia    years ago  . Anxiety   . Blue toes    2nd toe on right foot, will get appt.  . Bulging disc   . CAD (coronary artery disease) 2009   s/p stent to LAD  . 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 Chicken pox   . Coronary arteritis   . Degenerative disc disease   . Depression   . Diabetes mellitus without complication ([place])   . Facet  joint disease ([place])   . Fibromyalgia   . Heart disease   . Hyperlipidemia   . IBS (irritable bowel syndrome)   . MCL deficiency, knee   . MRSA (methicillin resistant staph aureus) culture positive 2011   GREAT TOE RIGHT FOOT  . Neuropathy 04/18/2010  . Peripheral neuropathy   . Sleep apnea    uses CPAP, sleep study at [facility]  . Spinal stenosis     Assessment:to dose and manage vancomycin for this patient.    Goal of Therapy:  Vancomycin trough level 15-20 mcg/ml  Plan:  Patient received loading dose of 1500 mg on admission. Will start vancomycin 1250 mg q12h and check trough level 8/26 @ 0830.   Used adjusted body weight: 82.82 kg Ke 0.071  Vd 57.9  T1/2 9.76 Expected trough 17.8    [place] Resident  02/22/2017,4:33 PM

## 2017-02-22 NOTE — Therapy (Signed)
Pt brought in Home CPAP.  I assessed system and added sterile water to the water chamber.  CPAP is in very good working order.  Electrical cords in tact.

## 2017-02-22 NOTE — H&P (Signed)
[facility] [place] NAME: Melinda [name]    MR#:  [id number]  DATE OF BIRTH:  Apr 02, 1956  DATE OF ADMISSION:  02/22/2017  PRIMARY CARE PHYSICIAN: Jearld Fenton, NP   REQUESTING/REFERRING PHYSICIAN: Dr. Trellis Paganini  CHIEF COMPLAINT:  No chief complaint on file. Right foot toe swelling  HISTORY OF PRESENT ILLNESS:  Melinda [name]  is a 61 y.o. female with a known history of fibromyalgia, chronic low back pain status post surgeries for degenerative disc disease on a pain pump, CAD, depression, diabetic neuropathy, diabetes mellitus presents to the hospital from podiatry office secondary to worsening postop changes to her right foot. Patient has had ulcer on her right foot second toe and has been seeing a podiatrist for the same. About 3 weeks ago she had surgery and amputation of the right foot second toe for the same. During her last 2 postoperative visit after the surgery the surgical area was looking clean. There was a small dehiscence at the end of the suture line on the dorsum of the foot. There has been no discharge during her latest visit to the physician. However since yesterday she has been having increased fevers, chills and malaise. Her foot which was supposed to be numb from her neuropathy started hurting more, she was unable to there are any weight on ambulation. So she called the podiatry office and went into see the on-call podiatrist, there is concern that there might be so off inside and the wound needs to be debrided again. She is being admitted for antibiotics and possible intervention. Patient denies any abdominal pain, complains of nausea but no vomiting. She was hypotensive in the physician's office today and complains of dizziness and blackout episodes. Intraoperative cultures were reportedly growing group C streptococcus.  PAST MEDICAL HISTORY:   Past Medical History:  Diagnosis Date  . Anemia    years ago  . Anxiety   .  Blue toes    2nd toe on right foot, will get appt.  . Bulging disc   . CAD (coronary artery disease) 2009   s/p stent to LAD  . 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 Chicken pox   . Coronary arteritis   . Degenerative disc disease   . Depression   . Diabetes mellitus without complication ([place])   . Facet joint disease ([place])   . Fibromyalgia   . Heart disease   . Hyperlipidemia   . IBS (irritable bowel syndrome)   . MCL deficiency, knee   . MRSA (methicillin resistant staph aureus) culture positive 2011   GREAT TOE RIGHT FOOT  . Neuropathy 04/18/2010  . Peripheral neuropathy   . Sleep apnea    uses CPAP, sleep study at [facility]  . Spinal stenosis     PAST SURGICAL HISTORY:   Past Surgical History:  Procedure Laterality Date  . ABLATION     UTERUS  . ABLATION     HEART  . AMPUTATION TOE Right 02/01/2017   Procedure: AMPUTATION TOE-RIGHT 2ND MPJ;  Surgeon: Albertine Patricia, DPM;  Location: [facility] ORS;  Service: Podiatry;  Laterality: Right;  . ANTERIOR CERVICAL DECOMP/DISCECTOMY FUSION N/A 07/28/2014   Procedure: ANTERIOR CERVICAL DECOMPRESSION/DISCECTOMY FUSION CERVICAL 3-4,4-5,5-6 LEVELS WITH INSTRUMENTATION AND ALLOGRAFT;  Surgeon: Sinclair Ship, MD;  Location: [place];  Service: Orthopedics;  Laterality: N/A;  Anterior cervical decompression fusion, cervical 3-4, cervical 4-5, cervical 5-6 with instrumentation and allograft  . BACK SURGERY  X 3 1979, 1994, 1995  . CARPAL TUNNEL RELEASE Right   . CHOLECYSTECTOMY  2003  . CORONARY ANGIOPLASTY    . EYE SURGERY Bilateral 2013   Eyelid lift   . FOOT SURGERY Right    BIG TOE  . FOOT SURGERY Bilateral    PLANTAR FASCIITIS  . FOOT SURGERY Right    2ND TOE  . GALLBLADDER SURGERY    . HAND SURGERY Left   . HAND SURGEY Left   . HEART STENT  2009   LAD  . INFUSION PUMP IMPLANTATION     X2 with morphine and baclofen    SOCIAL HISTORY:   Social History  Substance Use Topics  . Smoking  status: Former Smoker    Packs/day: 1.50    Years: 32.00    Types: Cigarettes    Quit date: 07/22/2007  . Smokeless tobacco: Never Used  . Alcohol use 0.0 oz/week     Comment: occ    FAMILY HISTORY:   Family History  Problem Relation Age of Onset  . Lung cancer Mother   . Diabetes Father   . Heart disease Maternal Grandfather   . Diabetes Paternal Grandmother   . Colon cancer Paternal Grandfather   . Stroke Neg Hx     DRUG ALLERGIES:  No Known Allergies  REVIEW OF SYSTEMS:   Review of Systems  Constitutional: Positive for chills, fever and malaise/fatigue. Negative for weight loss.  HENT: Negative for ear discharge, ear pain, hearing loss and nosebleeds.   Eyes: Negative for blurred vision, double vision and photophobia.  Respiratory: Negative for cough, hemoptysis, shortness of breath and wheezing.   Cardiovascular: Negative for chest pain, palpitations, orthopnea and leg swelling.  Gastrointestinal: Positive for nausea. Negative for abdominal pain, constipation, diarrhea, heartburn, melena and vomiting.  Genitourinary: Negative for dysuria, frequency, hematuria and urgency.  Musculoskeletal: Positive for back pain and myalgias. Negative for neck pain.  Skin: Negative for rash.  Neurological: Positive for dizziness. Negative for tremors, sensory change, speech change, focal weakness and headaches.  Endo/Heme/Allergies: Does not bruise/bleed easily.  Psychiatric/Behavioral: Negative for depression.    MEDICATIONS AT HOME:   Prior to Admission medications   Medication Sig Start Date End Date Taking? Authorizing Provider  ACCU-CHEK SOFTCLIX LANCETS lancets Use to check blood sugar 2 times daily.  Dx: E11.42 01/21/17  Yes Jearld Fenton, NP  buPROPion (WELLBUTRIN XL) 150 MG 24 hr tablet TAKE 1 TABLET BY MOUTH DAILY 01/28/17  Yes Jearld Fenton, NP  Cholecalciferol (VITAMIN D3) 2000 units TABS Take 2,000 Units by mouth daily.   Yes [provider]  acetaminophen  (TYLENOL) 500 MG tablet Take 1,000 mg by mouth every 6 (six) hours as needed (for pain/headache.).    [provider]  AMBULATORY NON FORMULARY MEDICATION Medication Name: CPAP MASK OF CHOICE FOR HOME DEVICE 07/01/15   Laverle Hobby, MD  aspirin 81 MG tablet Take 81 mg by mouth daily.    [provider]  Chromium Picolinate 800 MCG TABS Take 800 mcg by mouth daily.    [provider]  Cinnamon Bark POWD Take 1 capsule by mouth daily.    [provider]  Coenzyme Q10 (CO Q 10) 100 MG CAPS Take 200 mg by mouth daily.     [provider]  doxycycline (VIBRA-TABS) 100 MG tablet Take 100 mg by mouth 2 (two) times daily.    [provider]  glucosamine-chondroitin 500-400 MG tablet Take 1 tablet by mouth daily.  [provider]  glucose blood (ACCU-CHEK AVIVA PLUS) test strip TEST 2 TIMES DAY  **E11.9** 12/11/16   Baity, Coralie Keens, NP  glucose blood test strip 1 each by Other route. Use as instructed    [provider]  GRAPE SEED EXTRACT PO Take 1 capsule by mouth daily.    [provider]  ibuprofen (ADVIL,MOTRIN) 200 MG tablet Take 600 mg by mouth every 8 (eight) hours as needed (for pain.).    [provider]  MAGNESIUM MALATE PO Take 115 mg by mouth daily.    [provider]  meloxicam (MOBIC) 15 MG tablet Take 1 tablet (15 mg total) by mouth daily. 02/04/17 05/05/17  [name], MD  Multiple Vitamin (MULTIVITAMIN) tablet Take 1 tablet by mouth daily.    [provider]  nitroGLYCERIN (NITROSTAT) 0.4 MG SL tablet Place 1 tablet (0.4 mg total) under the tongue every 5 (five) minutes as needed for chest pain. 05/21/16   Jerline Pain, MD  Omega-3 Fatty Acids (FISH OIL) 1200 MG CAPS Take 1,200 mg by mouth daily.     [provider]  oxyCODONE (OXY IR/ROXICODONE) 5 MG immediate release tablet Take 1 tablet (5 mg total) by mouth every 8 (eight) hours as needed for severe  pain. Patient not taking: Reported on 01/30/2017 03/06/17 04/05/17  [name], MD  oxyCODONE (OXY IR/ROXICODONE) 5 MG immediate release tablet Take 1 tablet (5 mg total) by mouth every 8 (eight) hours as needed for severe pain. Patient not taking: Reported on 01/30/2017 04/05/17 05/05/17  [name], MD  oxyCODONE (OXY IR/ROXICODONE) 5 MG immediate release tablet Take 1 tablet (5 mg total) by mouth every 8 (eight) hours as needed for severe pain. 02/04/17 03/06/17  [name], MD  oxyCODONE (ROXICODONE) 5 MG immediate release tablet Take 1 tablet (5 mg total) by mouth every 4 (four) hours as needed for severe pain. 02/01/17   Albertine Patricia, DPM  PAIN MANAGEMENT IT PUMP REFILL 1 each by Intrathecal route once. Medication: PF Fentanyl 500.0 mcg/ml PF Baclofen 300.36mcg/ml PF Bupivicaine 20.0 mg/ml Total Volume: 40 ml Needed by 12-11-16 @1000  Patient taking differently: 1 each by Intrathecal route once. Medication: PF Fentanyl 147.95 mcg/24 hr PF Baclofen 88.51mcg/24hr PF Bupivicaine 5.918 mg/24 hr 11/30/16 01/30/18  [name], MD  PARoxetine (PAXIL) 20 MG tablet TAKE ONE-HALF OF A TABLET BY MOUTH TWICEDAILY Patient taking differently: TAKE ONE-HALF OF A TABLET BY MOUTH DAILY IN THE MORNING 06/22/16   Baity, Coralie Keens, NP  polyethylene glycol (MIRALAX / GLYCOLAX) packet Take 17 g by mouth daily as needed (for constipation.).     [provider]  pregabalin (LYRICA) 100 MG capsule Take 1 capsule (100 mg total) by mouth 3 (three) times daily. 02/04/17 05/05/17  [name], MD  rosuvastatin (CRESTOR) 20 MG tablet Take 1 tablet (20 mg total) by mouth daily. 05/21/16   Jerline Pain, MD  tiZANidine (ZANAFLEX) 4 MG capsule Take 1 capsule (4 mg total) by mouth 3 (three) times daily. 02/04/17 05/05/17  [name], MD  vitamin E 400 UNIT capsule Take 400 Units by mouth daily.    [provider]      VITAL SIGNS:  Blood pressure (!) 95/56, pulse 75,  temperature 98.7 F (37.1 C), temperature source Oral, resp. rate 18, height 5\' 10"  (1.778 m), weight 104.3 kg (230 lb), SpO2 100 %.  PHYSICAL EXAMINATION:   Physical Exam  GENERAL:  61 y.o.-year-old patient lying in the bed with no acute  distress.  EYES: Pupils equal, round, reactive to light and accommodation. No scleral icterus. Extraocular muscles intact.  HEENT: Head atraumatic, normocephalic. Oropharynx and nasopharynx clear.  NECK:  Supple, no jugular venous distention. No thyroid enlargement, no tenderness.  LUNGS: Normal breath sounds bilaterally, no wheezing, rales,rhonchi or crepitation. No use of accessory muscles of respiration.  CARDIOVASCULAR: S1, S2 normal. No murmurs, rubs, or gallops.  ABDOMEN: Soft, nontender, nondistended. Bowel sounds present. No organomegaly or mass.  EXTREMITIES: right foot s/p first and second toes amputated, but at recent surgical site dorsal side, an opening with slough, surrounding erythema, swelling and tenderness noted. No active discharge. Suture site healing well. No pedal edema, cyanosis, or clubbing.  NEUROLOGIC: Cranial nerves II through XII are intact. Muscle strength 5/5 in all extremities. Sensation intact except for the feet and lower one third of her legs with decreased sensation to fine touch. Gait not checked.  PSYCHIATRIC: The patient is alert and oriented x 3.  SKIN: No obvious rash, lesion, or ulcer.   LABORATORY PANEL:   CBC No results for input(s): WBC, HGB, HCT, PLT in the last 168 hours. ------------------------------------------------------------------------------------------------------------------  Chemistries  No results for input(s): NA, K, CL, CO2, GLUCOSE, BUN, CREATININE, CALCIUM, MG, AST, ALT, ALKPHOS, BILITOT in the last 168 hours.  Invalid input(s): GFRCGP ------------------------------------------------------------------------------------------------------------------  Cardiac Enzymes No results for input(s):  TROPONINI in the last 168 hours. ------------------------------------------------------------------------------------------------------------------  RADIOLOGY:  Dg Foot Complete Right  Result Date: 02/22/2017 CLINICAL DATA:  Pt has an abcess on the base of the 2nd right toe. She has had the first 2 toes removed. Hx of diabetes. EXAM: RIGHT FOOT COMPLETE - 3+ VIEW COMPARISON:  None. FINDINGS: Status post amputations of the first and second toes at the MTP joints. No osseous erosions, destructive changes or other secondary signs of osteomyelitis at the adjacent metatarsal heads. Adjacent soft tissues are unremarkable without discrete evidence of fluid collection or abscess. No suspicious soft tissue gas. There is through fifth toes are unremarkable. Osseous structures of the hindfoot and midfoot are unremarkable. IMPRESSION: 1. No acute findings. No evidence of osteomyelitis seen. No convincing evidence of soft tissue abscess or soft tissue gas. 2. Amputation deformities at the first and second MTP joints, without evidence of surgical complicating feature on this plain film examination. Electronically Signed   By: Franki Cabot M.D.   On: 02/22/2017 11:38    EKG:   Orders placed or performed during the hospital encounter of 01/31/17  . EKG 12-Lead  . EKG 12-Lead    IMPRESSION AND PLAN:   Sansa Alkema  is a 61 y.o. female with a known history of fibromyalgia, chronic low back pain status post surgeries for degenerative disc disease on a pain pump, CAD, depression, diabetic neuropathy, diabetes mellitus presents to the hospital from podiatry office secondary to worsening postop changes to her right foot.  #1 cellulitis of right distal foot- at the site of recent second toe amputation. -MRI ordered, podiatry consulted. -Started on vancomycin at this time until culture results are available. -keep the leg elevated. Might need debridement. -IV fluids for borderline low blood pressure  #2  fibromyalgia and chronic low back pain-follows with pain management clinic. -Has a pain pump with bupivacaine, baclofen and fentanyl. Continue outpatient pain medications.  #3 Diabetes mellitus-diet control. Check A1c and started on sliding scale insulin.  #4 hyperlipidemia-on statin  #5 DVT prophylaxis-Lovenox    All the records are reviewed and case discussed with ED provider. Management plans discussed with the  patient, family and they are in agreement.  CODE STATUS: Full Code  TOTAL TIME TAKING CARE OF THIS PATIENT: 50 minutes.    Breken Nazari M.D on 02/22/2017 at 12:40 PM  Between 7am to 6pm - Pager - [phone]  After 6pm go to [email or web address] - password [email or web address] [facility]  Office  [phone]  CC: Primary care physician; Jearld Fenton, NP

## 2017-02-22 NOTE — Consult Note (Signed)
ORTHOPAEDIC CONSULTATION  REQUESTING PHYSICIAN: Gladstone Lighter, MD  Chief Complaint: Right foot infection  HPI: Melinda [name] is a 61 y.o. female who complains of  Infection right foot.  S/p right 2nd toe amp.  Worsening pain and redness over last 2 day.  Fever last pm.  Admitted for IV abx and I & D.  Past Medical History:  Diagnosis Date  . Anemia    years ago  . Anxiety   . Blue toes    2nd toe on right foot, will get appt.  . Bulging disc   . CAD (coronary artery disease) 2009   s/p stent to LAD  . 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 Chicken pox   . Coronary arteritis   . Degenerative disc disease   . Depression   . Diabetes mellitus without complication ([place])   . Facet joint disease ([place])   . Fibromyalgia   . Heart disease   . Hyperlipidemia   . IBS (irritable bowel syndrome)   . MCL deficiency, knee   . MRSA (methicillin resistant staph aureus) culture positive 2011   GREAT TOE RIGHT FOOT  . Neuropathy 04/18/2010  . Peripheral neuropathy   . Sleep apnea    uses CPAP, sleep study at [facility]  . Spinal stenosis    Past Surgical History:  Procedure Laterality Date  . ABLATION     UTERUS  . ABLATION     HEART  . AMPUTATION TOE Right 02/01/2017   Procedure: AMPUTATION TOE-RIGHT 2ND MPJ;  Surgeon: Albertine Patricia, DPM;  Location: [facility] ORS;  Service: Podiatry;  Laterality: Right;  . ANTERIOR CERVICAL DECOMP/DISCECTOMY FUSION N/A 07/28/2014   Procedure: ANTERIOR CERVICAL DECOMPRESSION/DISCECTOMY FUSION CERVICAL 3-4,4-5,5-6 LEVELS WITH INSTRUMENTATION AND ALLOGRAFT;  Surgeon: Sinclair Ship, MD;  Location: [facility];  Service: Orthopedics;  Laterality: N/A;  Anterior cervical decompression fusion, cervical 3-4, cervical 4-5, cervical 5-6 with instrumentation and allograft  . BACK SURGERY     X 3 1979, 1994, 1995  . CARPAL TUNNEL RELEASE Right   . CHOLECYSTECTOMY  2003  . CORONARY ANGIOPLASTY    . EYE SURGERY Bilateral 2013   Eyelid lift   . FOOT SURGERY Right    BIG TOE  . FOOT SURGERY Bilateral    PLANTAR FASCIITIS  . FOOT SURGERY Right    2ND TOE  . GALLBLADDER SURGERY    . HAND SURGERY Left   . HAND SURGEY Left   . HEART STENT  2009   LAD  . INFUSION PUMP IMPLANTATION     X2 with morphine and baclofen   Social History   Social History  . Marital status: Significant Other    Spouse name: N/A  . Number of children: N/A  . Years of education: N/A   Social History Main Topics  . Smoking status: Former Smoker    Packs/day: 1.50    Years: 32.00    Types: Cigarettes    Quit date: 07/22/2007  . Smokeless tobacco: Never Used  . Alcohol use 0.0 oz/week     Comment: occ  . Drug use: No  . Sexual activity: Yes   Other Topics Concern  . None   Social History Narrative   Lives at home with a partner. Independent at baseline   Family History  Problem Relation Age of Onset  . Lung cancer Mother   . Diabetes Father   . Heart disease Maternal Grandfather   . Diabetes Paternal Grandmother   . Colon cancer  Paternal Grandfather   . Stroke Neg Hx    No Known Allergies Prior to Admission medications   Medication Sig Start Date End Date Taking? Authorizing Provider  ACCU-CHEK SOFTCLIX LANCETS lancets Use to check blood sugar 2 times daily.  Dx: E11.42 01/21/17  Yes Jearld Fenton, NP  buPROPion (WELLBUTRIN XL) 150 MG 24 hr tablet TAKE 1 TABLET BY MOUTH DAILY 01/28/17  Yes Jearld Fenton, NP  Cholecalciferol (VITAMIN D3) 2000 units TABS Take 2,000 Units by mouth daily.   Yes [provider]  acetaminophen (TYLENOL) 500 MG tablet Take 1,000 mg by mouth every 6 (six) hours as needed (for pain/headache.).    [provider]  AMBULATORY NON FORMULARY MEDICATION Medication Name: CPAP MASK OF CHOICE FOR HOME DEVICE 07/01/15   Laverle Hobby, MD  aspirin 81 MG tablet Take 81 mg by mouth daily.    [provider]  Chromium Picolinate 800 MCG TABS Take 800 mcg by mouth daily.     [provider]  Cinnamon Bark POWD Take 1 capsule by mouth daily.    [provider]  Coenzyme Q10 (CO Q 10) 100 MG CAPS Take 200 mg by mouth daily.     [provider]  doxycycline (VIBRA-TABS) 100 MG tablet Take 100 mg by mouth 2 (two) times daily.    [provider]  glucosamine-chondroitin 500-400 MG tablet Take 1 tablet by mouth daily.     [provider]  glucose blood (ACCU-CHEK AVIVA PLUS) test strip TEST 2 TIMES DAY  **E11.9** 12/11/16   Baity, Coralie Keens, NP  glucose blood test strip 1 each by Other route. Use as instructed    [provider]  GRAPE SEED EXTRACT PO Take 1 capsule by mouth daily.    [provider]  ibuprofen (ADVIL,MOTRIN) 200 MG tablet Take 600 mg by mouth every 8 (eight) hours as needed (for pain.).    [provider]  MAGNESIUM MALATE PO Take 115 mg by mouth daily.    [provider]  meloxicam (MOBIC) 15 MG tablet Take 1 tablet (15 mg total) by mouth daily. 02/04/17 05/05/17  [name], MD  Multiple Vitamin (MULTIVITAMIN) tablet Take 1 tablet by mouth daily.    [provider]  nitroGLYCERIN (NITROSTAT) 0.4 MG SL tablet Place 1 tablet (0.4 mg total) under the tongue every 5 (five) minutes as needed for chest pain. 05/21/16   Jerline Pain, MD  Omega-3 Fatty Acids (FISH OIL) 1200 MG CAPS Take 1,200 mg by mouth daily.     [provider]  oxyCODONE (OXY IR/ROXICODONE) 5 MG immediate release tablet Take 1 tablet (5 mg total) by mouth every 8 (eight) hours as needed for severe pain. Patient not taking: Reported on 01/30/2017 03/06/17 04/05/17  [name], MD  oxyCODONE (OXY IR/ROXICODONE) 5 MG immediate release tablet Take 1 tablet (5 mg total) by mouth every 8 (eight) hours as needed for severe pain. Patient not taking: Reported on 01/30/2017 04/05/17 05/05/17  [name], MD  oxyCODONE (OXY IR/ROXICODONE) 5 MG immediate release tablet Take 1 tablet (5 mg  total) by mouth every 8 (eight) hours as needed for severe pain. 02/04/17 03/06/17  [name], MD  oxyCODONE (ROXICODONE) 5 MG immediate release tablet Take 1 tablet (5 mg total) by mouth every 4 (four) hours as needed for severe pain. 02/01/17   Albertine Patricia, DPM  PAIN MANAGEMENT IT PUMP REFILL 1 each by Intrathecal route once. Medication: PF Fentanyl 500.0 mcg/ml PF Baclofen  300.10mcg/ml PF Bupivicaine 20.0 mg/ml Total Volume: 40 ml Needed by 12-11-16 @1000  Patient taking differently: 1 each by Intrathecal route once. Medication: PF Fentanyl 147.95 mcg/24 hr PF Baclofen 88.77mcg/24hr PF Bupivicaine 5.918 mg/24 hr 11/30/16 01/30/18  [name], MD  PARoxetine (PAXIL) 20 MG tablet TAKE ONE-HALF OF A TABLET BY MOUTH TWICEDAILY Patient taking differently: TAKE ONE-HALF OF A TABLET BY MOUTH DAILY IN THE MORNING 06/22/16   Baity, Coralie Keens, NP  polyethylene glycol (MIRALAX / GLYCOLAX) packet Take 17 g by mouth daily as needed (for constipation.).     [provider]  pregabalin (LYRICA) 100 MG capsule Take 1 capsule (100 mg total) by mouth 3 (three) times daily. 02/04/17 05/05/17  [name], MD  rosuvastatin (CRESTOR) 20 MG tablet Take 1 tablet (20 mg total) by mouth daily. 05/21/16   Jerline Pain, MD  tiZANidine (ZANAFLEX) 4 MG capsule Take 1 capsule (4 mg total) by mouth 3 (three) times daily. 02/04/17 05/05/17  [name], MD  vitamin E 400 UNIT capsule Take 400 Units by mouth daily.    [provider]   Mr Foot Right Wo Contrast  Result Date: 02/22/2017 CLINICAL DATA:  Patient status post amputation of the right second toe for gangrene 02/01/2017. The patient experienced a small dehiscence of the surgical wound 2 days after surgery. She began to develop fever, chills in the malaise 02/21/2017. EXAM: MRI OF THE RIGHT FOREFOOT WITHOUT CONTRAST TECHNIQUE: Multiplanar, multisequence MR imaging of the right forefoot was performed. No intravenous contrast was  administered. COMPARISON:  Plain films right foot earlier today and 09/20/2014. FINDINGS: Bones/Joint/Cartilage The patient is status post amputation of both the first and second toes at the level of the MTP joint. There is no bone marrow signal abnormality to suggest osteomyelitis. Minimal edema in the medial sesamoid bone is compatible with sesamoid dysfunction. The patient is status post hallux valgus repair. Ligaments Intact. Muscles and Tendons Atrophy of intrinsic musculature the foot is noted. No intramuscular fluid collection. Soft tissues A small skin wound is seen on the dorsum of the foot at the amputation site of the second toe. Subjacent to the wound, a fluid collection measuring 0.5 cm craniocaudal by 0.9 cm transverse by 0.1 cm long is contiguous with the head of the first metatarsal and appears to communicate with the skin wound. Mild soft tissue edema is seen at the amputation site. IMPRESSION: Status post amputation of the first and second toes. There is a small skin wound in the dorsal soft tissues at the amputation site of the second toe. Tiny underlying fluid collection is contiguous with the first metatarsal head. This fluid could be due to postoperative seroma or infection. The examination is negative for osteomyelitis. Electronically Signed   By: Inge Rise M.D.   On: 02/22/2017 14:06   Dg Foot Complete Right  Result Date: 02/22/2017 CLINICAL DATA:  Pt has an abcess on the base of the 2nd right toe. She has had the first 2 toes removed. Hx of diabetes. EXAM: RIGHT FOOT COMPLETE - 3+ VIEW COMPARISON:  None. FINDINGS: Status post amputations of the first and second toes at the MTP joints. No osseous erosions, destructive changes or other secondary signs of osteomyelitis at the adjacent metatarsal heads. Adjacent soft tissues are unremarkable without discrete evidence of fluid collection or abscess. No suspicious soft tissue gas. There is through fifth toes are unremarkable. Osseous  structures of the hindfoot and midfoot are unremarkable. IMPRESSION: 1. No acute findings. No evidence of  osteomyelitis seen. No convincing evidence of soft tissue abscess or soft tissue gas. 2. Amputation deformities at the first and second MTP joints, without evidence of surgical complicating feature on this plain film examination. Electronically Signed   By: Franki Cabot M.D.   On: 02/22/2017 11:38    Positive ROS: All other systems have been reviewed and were otherwise negative with the exception of those mentioned in the HPI and as above.  12 point ROS was performed.  Physical Exam: General: Alert and oriented.  No apparent distress.  Vascular:  Left foot:Dorsalis Pedis:  present Posterior Tibial:  diminished  Right foot: Dorsalis Pedis:  present Posterior Tibial:  diminished  Neuro:absent protective sensation  Derm:Erythema overlying incision right 2nd mtpj.  Scant purulence.    Ortho/MS: s/p amputation 1st and 2nd toe right foot  Xray:  No osteo or gas  MRI:  Abscess around surgical site.  No osteo   Assessment: Abscess right foot DM with neuropathy  Plan: Will plan for I & D tomorrow.  NPO p midnight. D/W pt r/b/a/c and consent given. Preliminary outpt culture with beta hemolytic strep group c heavy growth.  Awaiting c/s.     Elesa Hacker, DPM Cell [phone]   02/22/2017 5:04 PM

## 2017-02-23 ENCOUNTER — Encounter: Payer: Self-pay | Admitting: Anesthesiology

## 2017-02-23 ENCOUNTER — Inpatient Hospital Stay: Payer: Medicare Other | Admitting: Anesthesiology

## 2017-02-23 ENCOUNTER — Encounter
Admission: AD | Disposition: A | Discharge status: Home Health | Payer: Self-pay | Source: Ambulatory Visit | Attending: Internal Medicine

## 2017-02-23 HISTORY — PX: IRRIGATION AND DEBRIDEMENT FOOT: SHX6602

## 2017-02-23 LAB — GLUCOSE, CAPILLARY
Glucose-Capillary: 100 mg/dL — ABNORMAL HIGH (ref 65–99)
Glucose-Capillary: 122 mg/dL — ABNORMAL HIGH (ref 65–99)
Glucose-Capillary: 151 mg/dL — ABNORMAL HIGH (ref 65–99)
Glucose-Capillary: 112 mg/dL — ABNORMAL HIGH (ref 65–99)

## 2017-02-23 LAB — BASIC METABOLIC PANEL
Anion gap: 5 (ref 5–15)
BUN: 24 mg/dL — ABNORMAL HIGH (ref 6–20)
CO2: 28 mmol/L (ref 22–32)
Calcium: 8.4 mg/dL — ABNORMAL LOW (ref 8.9–10.3)
Chloride: 108 mmol/L (ref 101–111)
Creatinine, Ser: 0.97 mg/dL (ref 0.44–1.00)
GFR calc Af Amer: >60 mL/min (ref >60)
GFR calc non Af Amer: >60 mL/min (ref >60)
Glucose, Bld: 147 mg/dL — ABNORMAL HIGH (ref 65–99)
Potassium: 4.5 mmol/L (ref 3.5–5.1)
Sodium: 141 mmol/L (ref 135–145)

## 2017-02-23 LAB — CBC
HCT: 32.7 % — ABNORMAL LOW (ref 35.0–47.0)
Hemoglobin: 11.3 g/dL — ABNORMAL LOW (ref 12.0–16.0)
MCH: 29.4 pg (ref 26.0–34.0)
MCHC: 34.4 g/dL (ref 32.0–36.0)
MCV: 85.3 fL (ref 80.0–100.0)
Platelets: 157 10*3/uL (ref 150–440)
RBC: 3.84 MIL/uL (ref 3.80–5.20)
RDW: 13.2 % (ref 11.5–14.5)
WBC: 5.9 10*3/uL (ref 3.6–11.0)

## 2017-02-23 LAB — HIV ANTIBODY (ROUTINE TESTING W REFLEX): HIV Screen 4th Generation wRfx: NONREACTIVE

## 2017-02-23 SURGERY — IRRIGATION AND DEBRIDEMENT FOOT
Anesthesia: General | Laterality: Right

## 2017-02-23 MED ORDER — CHLORHEXIDINE GLUCONATE 4 % EX LIQD
60.0000 mL | Freq: Once | CUTANEOUS | Status: AC
  Administered 2017-02-23: 4 via TOPICAL

## 2017-02-23 MED ORDER — CHLORHEXIDINE GLUCONATE CLOTH 2 % EX PADS: 6.0000 | MEDICATED_PAD | Freq: Every day | CUTANEOUS | Status: DC

## 2017-02-23 MED ORDER — GLYCOPYRROLATE 0.2 MG/ML IJ SOLN
INTRAMUSCULAR | Status: DC | PRN
  Administered 2017-02-23: .2 mg via INTRAVENOUS

## 2017-02-23 MED ORDER — FENTANYL CITRATE (PF) 100 MCG/2ML IJ SOLN: 25.0000 ug | INTRAMUSCULAR | Status: DC | PRN

## 2017-02-23 MED ORDER — PROPOFOL 10 MG/ML IV BOLUS
INTRAVENOUS | Status: DC | PRN
  Administered 2017-02-23: 200 mg via INTRAVENOUS

## 2017-02-23 MED ORDER — EPHEDRINE SULFATE 50 MG/ML IJ SOLN
INTRAMUSCULAR | Status: AC
Start: 2017-02-23 — End: 2017-02-23
  Filled 2017-02-23: qty 1

## 2017-02-23 MED ORDER — BUPIVACAINE HCL (PF) 0.5 % IJ SOLN
INTRAMUSCULAR | Status: AC
Start: 2017-02-23 — End: 2017-02-23
  Filled 2017-02-23: qty 30

## 2017-02-23 MED ORDER — LIDOCAINE-EPINEPHRINE 1 %-1:100000 IJ SOLN
INTRAMUSCULAR | Status: DC | PRN
  Administered 2017-02-23: 3.5 mL

## 2017-02-23 MED ORDER — PROPOFOL 10 MG/ML IV BOLUS
INTRAVENOUS | Status: AC
  Filled 2017-02-23: qty 20

## 2017-02-23 MED ORDER — CEFAZOLIN SODIUM-DEXTROSE 2-4 GM/100ML-% IV SOLN
2.0000 g | INTRAVENOUS | Status: AC
  Administered 2017-02-23: 2 g via INTRAVENOUS
  Filled 2017-02-23: qty 100

## 2017-02-23 MED ORDER — EPHEDRINE SULFATE 50 MG/ML IJ SOLN
INTRAMUSCULAR | Status: DC | PRN
  Administered 2017-02-23: 10 mg via INTRAVENOUS

## 2017-02-23 MED ORDER — MIDAZOLAM HCL 2 MG/2ML IJ SOLN
INTRAMUSCULAR | Status: DC | PRN
  Administered 2017-02-23: 2 mg via INTRAVENOUS

## 2017-02-23 MED ORDER — LIDOCAINE HCL (PF) 1 % IJ SOLN
INTRAMUSCULAR | Status: AC
  Filled 2017-02-23: qty 30

## 2017-02-23 MED ORDER — LIDOCAINE-EPINEPHRINE 1 %-1:100000 IJ SOLN
INTRAMUSCULAR | Status: AC
  Filled 2017-02-23: qty 1

## 2017-02-23 MED ORDER — LIDOCAINE HCL (CARDIAC) 20 MG/ML IV SOLN
INTRAVENOUS | Status: DC | PRN
  Administered 2017-02-23: 100 mg via INTRAVENOUS

## 2017-02-23 MED ORDER — MIDAZOLAM HCL 2 MG/2ML IJ SOLN
INTRAMUSCULAR | Status: AC
  Filled 2017-02-23: qty 2

## 2017-02-23 MED ORDER — GLYCOPYRROLATE 0.2 MG/ML IJ SOLN
INTRAMUSCULAR | Status: AC
  Filled 2017-02-23: qty 1

## 2017-02-23 MED ORDER — BUPIVACAINE HCL 0.5 % IJ SOLN
INTRAMUSCULAR | Status: DC | PRN
  Administered 2017-02-23: 3.5 mL

## 2017-02-23 MED ORDER — FENTANYL CITRATE (PF) 100 MCG/2ML IJ SOLN
INTRAMUSCULAR | Status: DC | PRN
  Administered 2017-02-23: 50 ug via INTRAVENOUS

## 2017-02-23 MED ORDER — LACTULOSE 10 GM/15ML PO SOLN
20.0000 g | Freq: Once | ORAL | Status: AC
  Administered 2017-02-24: 20 g via ORAL
  Filled 2017-02-23: qty 30

## 2017-02-23 MED ORDER — ONDANSETRON HCL 4 MG/2ML IJ SOLN
INTRAMUSCULAR | Status: DC | PRN
  Administered 2017-02-23: 4 mg via INTRAVENOUS

## 2017-02-23 MED ORDER — LIDOCAINE HCL (PF) 2 % IJ SOLN
INTRAMUSCULAR | Status: AC
  Filled 2017-02-23: qty 2

## 2017-02-23 MED ORDER — MUPIROCIN 2 % EX OINT
1.0000 "application " | TOPICAL_OINTMENT | Freq: Two times a day (BID) | CUTANEOUS | Status: DC
  Administered 2017-02-23: 1 via NASAL
  Filled 2017-02-23: qty 22

## 2017-02-23 MED ORDER — ONDANSETRON HCL 4 MG/2ML IJ SOLN
INTRAMUSCULAR | Status: AC
  Filled 2017-02-23: qty 2

## 2017-02-23 MED ORDER — PROMETHAZINE HCL 25 MG/ML IJ SOLN: 6.2500 mg | INTRAMUSCULAR | Status: DC | PRN

## 2017-02-23 MED ORDER — FENTANYL CITRATE (PF) 100 MCG/2ML IJ SOLN
INTRAMUSCULAR | Status: AC
  Filled 2017-02-23: qty 2

## 2017-02-23 SURGICAL SUPPLY — 64 items
BANDAGE ACE 4X5 VEL STRL LF (GAUZE/BANDAGES/DRESSINGS) ×2 IMPLANT
BANDAGE STRETCH 3X4.1 STRL (GAUZE/BANDAGES/DRESSINGS) ×2 IMPLANT
BLADE OSC/SAGITTAL MD 5.5X18 (BLADE) IMPLANT
BLADE OSCILLATING/SAGITTAL (BLADE)
BLADE SW THK.38XMED LNG THN (BLADE) IMPLANT
BNDG ADH 5X3 H2O RPLNT NS (GAUZE/BANDAGES/DRESSINGS) ×1
BNDG CMPR 75X21 PLY HI ABS (MISCELLANEOUS) ×1
BNDG COHESIVE 3X5 WHT NS (GAUZE/BANDAGES/DRESSINGS) ×1 IMPLANT
BNDG COHESIVE 4X5 TAN STRL (GAUZE/BANDAGES/DRESSINGS) ×2 IMPLANT
BNDG COHESIVE 6X5 TAN STRL LF (GAUZE/BANDAGES/DRESSINGS) ×2 IMPLANT
BNDG ESMARK 4X12 TAN STRL LF (GAUZE/BANDAGES/DRESSINGS) ×2 IMPLANT
BNDG GAUZE 4.5X4.1 6PLY STRL (MISCELLANEOUS) ×2 IMPLANT
CANISTER SUCT 1200ML W/VALVE (MISCELLANEOUS) ×2 IMPLANT
CANISTER SUCT 3000ML PPV (MISCELLANEOUS) ×2 IMPLANT
CUFF TOURN 18 STER (MISCELLANEOUS) ×2 IMPLANT
CUFF TOURN DUAL PL 12 NO SLV (MISCELLANEOUS) ×2 IMPLANT
DRAPE FLUOR MINI C-ARM 54X84 (DRAPES) IMPLANT
DRAPE XRAY CASSETTE 23X24 (DRAPES) IMPLANT
DRESSING ALLEVYN 4X4 (MISCELLANEOUS) IMPLANT
DURAPREP 26ML APPLICATOR (WOUND CARE) ×2 IMPLANT
ELECT REM PT RETURN 9FT ADLT (ELECTROSURGICAL) ×2
ELECTRODE REM PT RTRN 9FT ADLT (ELECTROSURGICAL) ×1 IMPLANT
GAUZE PACKING 1/4 X5 YD (GAUZE/BANDAGES/DRESSINGS) ×2 IMPLANT
GAUZE PACKING IODOFORM 1X5 (MISCELLANEOUS) ×2 IMPLANT
GAUZE PETRO XEROFOAM 1X8 (MISCELLANEOUS) ×2 IMPLANT
GAUZE SPONGE 4X4 12PLY STRL (GAUZE/BANDAGES/DRESSINGS) ×2 IMPLANT
GAUZE STRETCH 2X75IN STRL (MISCELLANEOUS) ×2 IMPLANT
GLOVE BIO SURGEON STRL SZ7.5 (GLOVE) ×2 IMPLANT
GLOVE INDICATOR 8.0 STRL GRN (GLOVE) ×2 IMPLANT
GOWN STRL REUS W/ TWL LRG LVL3 (GOWN DISPOSABLE) ×2 IMPLANT
GOWN STRL REUS W/TWL LRG LVL3 (GOWN DISPOSABLE) ×4
GOWN STRL REUS W/TWL MED LVL3 (GOWN DISPOSABLE) ×4 IMPLANT
HANDPIECE VERSAJET DEBRIDEMENT (MISCELLANEOUS) IMPLANT
IV NS 1000ML (IV SOLUTION) ×2
IV NS 1000ML BAXH (IV SOLUTION) ×1 IMPLANT
KIT RM TURNOVER STRD PROC AR (KITS) ×2 IMPLANT
LABEL OR SOLS (LABEL) ×2 IMPLANT
NDL FILTER BLUNT 18X1 1/2 (NEEDLE) ×1 IMPLANT
NDL HYPO 25X1 1.5 SAFETY (NEEDLE) ×1 IMPLANT
NEEDLE FILTER BLUNT 18X 1/2SAF (NEEDLE) ×1
NEEDLE FILTER BLUNT 18X1 1/2 (NEEDLE) ×1 IMPLANT
NEEDLE HYPO 25X1 1.5 SAFETY (NEEDLE) ×2 IMPLANT
NS IRRIG 500ML POUR BTL (IV SOLUTION) ×2 IMPLANT
PACK EXTREMITY ARMC (MISCELLANEOUS) ×2 IMPLANT
PAD ABD DERMACEA PRESS 5X9 (GAUZE/BANDAGES/DRESSINGS) ×2 IMPLANT
PULSAVAC PLUS IRRIG FAN TIP (DISPOSABLE) ×2
RASP SM TEAR CROSS CUT (RASP) IMPLANT
SOL .9 NS 3000ML IRR  AL (IV SOLUTION) ×1
SOL .9 NS 3000ML IRR AL (IV SOLUTION) ×1
SOL .9 NS 3000ML IRR UROMATIC (IV SOLUTION) ×1 IMPLANT
SOL PREP PVP 2OZ (MISCELLANEOUS) ×2
SOLUTION PREP PVP 2OZ (MISCELLANEOUS) ×1 IMPLANT
STOCKINETTE IMPERVIOUS 9X36 MD (GAUZE/BANDAGES/DRESSINGS) ×2 IMPLANT
SUT ETHILON 2 0 FS 18 (SUTURE) ×4 IMPLANT
SUT ETHILON 4-0 (SUTURE) ×2
SUT ETHILON 4-0 FS2 18XMFL BLK (SUTURE) ×1
SUT VIC AB 3-0 SH 27 (SUTURE) ×2
SUT VIC AB 3-0 SH 27X BRD (SUTURE) ×1 IMPLANT
SUT VIC AB 4-0 FS2 27 (SUTURE) ×2 IMPLANT
SUTURE ETHLN 4-0 FS2 18XMF BLK (SUTURE) ×1 IMPLANT
SWAB CULTURE AMIES ANAERIB BLU (MISCELLANEOUS) IMPLANT
SYR 3ML LL SCALE MARK (SYRINGE) ×2 IMPLANT
SYRINGE 10CC LL (SYRINGE) ×4 IMPLANT
TIP FAN IRRIG PULSAVAC PLUS (DISPOSABLE) ×1 IMPLANT

## 2017-02-23 NOTE — Progress Notes (Addendum)
Paged and spoke to Dr. Carlyle Dolly who ordered Lactulose for pt constipation. 30 mL 1 x dose. Also approved for pt to use BSC if needed for bowel movement NWB.

## 2017-02-23 NOTE — OR Nursing (Signed)
Patient voided 500 mls

## 2017-02-23 NOTE — Progress Notes (Signed)
[place] [facility] [place] NAME: Melinda [name]    MR#:  [id number]  DATE OF BIRTH:  1955/09/05  SUBJECTIVE:  CHIEF COMPLAINT:  No chief complaint on file.  - feels better, pain from her right foot I&D  REVIEW OF SYSTEMS:  Review of Systems  Constitutional: Negative for chills and fever.  HENT: Negative for congestion, ear discharge, hearing loss and nosebleeds.   Eyes: Negative for blurred vision and double vision.  Respiratory: Negative for cough, shortness of breath and wheezing.   Cardiovascular: Negative for chest pain, palpitations and leg swelling.  Gastrointestinal: Positive for constipation. Negative for abdominal pain, diarrhea, nausea and vomiting.  Genitourinary: Negative for dysuria and urgency.  Musculoskeletal: Positive for joint pain. Negative for myalgias.  Neurological: Negative for dizziness, seizures and headaches.    DRUG ALLERGIES:  No Known Allergies  VITALS:  Blood pressure (!) 141/75, pulse 73, temperature 99.4 F (37.4 C), resp. rate 16, height 5\' 10"  (1.778 m), weight 104.3 kg (230 lb), SpO2 94 %.  PHYSICAL EXAMINATION:  Physical Exam  GENERAL:  61 y.o.-year-old patient lying in the bed with no acute distress.  EYES: Pupils equal, round, reactive to light and accommodation. No scleral icterus. Extraocular muscles intact.  HEENT: Head atraumatic, normocephalic. Oropharynx and nasopharynx clear.  NECK:  Supple, no jugular venous distention. No thyroid enlargement, no tenderness.  LUNGS: Normal breath sounds bilaterally, no wheezing, rales,rhonchi or crepitation. No use of accessory muscles of respiration.  CARDIOVASCULAR: S1, S2 normal. No murmurs, rubs, or gallops.  ABDOMEN: Soft, nontender, nondistended. Bowel sounds present. No organomegaly or mass.  EXTREMITIES: right fore foot dressing in place.. No pedal edema, cyanosis, or clubbing.  NEUROLOGIC: Cranial nerves II through XII are intact. Muscle strength  5/5 in all extremities. Sensation intact except for the feet and lower one third of her legs with decreased sensation to fine touch. Gait not checked.  PSYCHIATRIC: The patient is alert and oriented x 3.  SKIN: No obvious rash, lesion, or ulcer.    LABORATORY PANEL:   CBC  Recent Labs Lab 02/23/17 0321  WBC 5.9  HGB 11.3*  HCT 32.7*  PLT 157   ------------------------------------------------------------------------------------------------------------------  Chemistries   Recent Labs Lab 02/23/17 0321  NA 141  K 4.5  CL 108  CO2 28  GLUCOSE 147*  BUN 24*  CREATININE 0.97  CALCIUM 8.4*   ------------------------------------------------------------------------------------------------------------------  Cardiac Enzymes No results for input(s): TROPONINI in the last 168 hours. ------------------------------------------------------------------------------------------------------------------  RADIOLOGY:  Mr Foot Right Wo Contrast  Result Date: 02/22/2017 CLINICAL DATA:  Patient status post amputation of the right second toe for gangrene 02/01/2017. The patient experienced a small dehiscence of the surgical wound 2 days after surgery. She began to develop fever, chills in the malaise 02/21/2017. EXAM: MRI OF THE RIGHT FOREFOOT WITHOUT CONTRAST TECHNIQUE: Multiplanar, multisequence MR imaging of the right forefoot was performed. No intravenous contrast was administered. COMPARISON:  Plain films right foot earlier today and 09/20/2014. FINDINGS: Bones/Joint/Cartilage The patient is status post amputation of both the first and second toes at the level of the MTP joint. There is no bone marrow signal abnormality to suggest osteomyelitis. Minimal edema in the medial sesamoid bone is compatible with sesamoid dysfunction. The patient is status post hallux valgus repair. Ligaments Intact. Muscles and Tendons Atrophy of intrinsic musculature the foot is noted. No intramuscular fluid  collection. Soft tissues A small skin wound is seen on the dorsum of the foot at the amputation  site of the second toe. Subjacent to the wound, a fluid collection measuring 0.5 cm craniocaudal by 0.9 cm transverse by 0.1 cm long is contiguous with the head of the first metatarsal and appears to communicate with the skin wound. Mild soft tissue edema is seen at the amputation site. IMPRESSION: Status post amputation of the first and second toes. There is a small skin wound in the dorsal soft tissues at the amputation site of the second toe. Tiny underlying fluid collection is contiguous with the first metatarsal head. This fluid could be due to postoperative seroma or infection. The examination is negative for osteomyelitis. Electronically Signed   By: Inge Rise M.D.   On: 02/22/2017 14:06   Dg Foot Complete Right  Result Date: 02/22/2017 CLINICAL DATA:  Pt has an abcess on the base of the 2nd right toe. She has had the first 2 toes removed. Hx of diabetes. EXAM: RIGHT FOOT COMPLETE - 3+ VIEW COMPARISON:  None. FINDINGS: Status post amputations of the first and second toes at the MTP joints. No osseous erosions, destructive changes or other secondary signs of osteomyelitis at the adjacent metatarsal heads. Adjacent soft tissues are unremarkable without discrete evidence of fluid collection or abscess. No suspicious soft tissue gas. There is through fifth toes are unremarkable. Osseous structures of the hindfoot and midfoot are unremarkable. IMPRESSION: 1. No acute findings. No evidence of osteomyelitis seen. No convincing evidence of soft tissue abscess or soft tissue gas. 2. Amputation deformities at the first and second MTP joints, without evidence of surgical complicating feature on this plain film examination. Electronically Signed   By: Franki Cabot M.D.   On: 02/22/2017 11:38    EKG:   Orders placed or performed during the hospital encounter of 01/31/17  . EKG 12-Lead  . EKG 12-Lead     ASSESSMENT AND PLAN:   Melinda [name]  is a 61 y.o. female with a known history of fibromyalgia, chronic low back pain status post surgeries for degenerative disc disease on a pain pump, CAD, depression, diabetic neuropathy, diabetes mellitus presents to the hospital from podiatry office secondary to worsening postop changes to her right foot.  #1 cellulitis of right distal foot- at the site of recent second toe amputation. -MRI with no osteomyelitis, possible abscess noted, appreciate podiatry consult. -Status post I & D and some purulent material and necrotic tissue cleaned out today -on vancomycin at this time until culture results are available. -keep the leg elevated. Dressing instructions and weight-bearing status as per podiatry -IV fluids  #2 fibromyalgia and chronic low back pain-follows with pain management clinic. -Has a pain pump with bupivacaine, baclofen and fentanyl. Continue outpatient pain medications.  #3 Diabetes mellitus-diet control. Check A1c and started on sliding scale insulin.  #4 hyperlipidemia-on statin  #5 DVT prophylaxis-Lovenox     All the records are reviewed and case discussed with Care Management/Social Workerr. Management plans discussed with the patient, family and they are in agreement.  CODE STATUS: Full Code  TOTAL TIME TAKING CARE OF THIS PATIENT: 38 minutes.   POSSIBLE D/C IN 2 DAYS, DEPENDING ON CLINICAL CONDITION.   Banesa Tristan M.D on 02/23/2017 at 11:24 AM  Between 7am to 6pm - Pager - [phone]  After 6pm go to [email or web address] - password [email or web address] [facility]  Office  [phone]  CC: Primary care physician; Jearld Fenton, NP

## 2017-02-23 NOTE — Anesthesia Procedure Notes (Signed)
Procedure Name: LMA Insertion Date/Time: 02/23/2017 9:24 AM Performed by: Silvana Newness Pre-anesthesia Checklist: Patient identified, Emergency Drugs available, Suction available, Patient being monitored and Timeout performed Patient Re-evaluated:Patient Re-evaluated prior to induction Oxygen Delivery Method: Circle system utilized Preoxygenation: Pre-oxygenation with 100% oxygen Induction Type: IV induction Ventilation: Mask ventilation without difficulty LMA: LMA inserted LMA Size: 4.0 Number of attempts: 1 Placement Confirmation: positive ETCO2 and breath sounds checked- equal and bilateral Tube secured with: Tape Dental Injury: Teeth and Oropharynx as per pre-operative assessment

## 2017-02-23 NOTE — Anesthesia Post-op Follow-up Note (Signed)
Anesthesia QCDR form completed.        

## 2017-02-23 NOTE — Progress Notes (Signed)
Patient alert and oriented. Denies pain. Right foot wrapped in gauze and kerlex. Patient awaiting procedure.   Deri Fuelling, RN

## 2017-02-23 NOTE — Transfer of Care (Signed)
Immediate Anesthesia Transfer of Care Note  Patient: AUBREANNA PERCLE  Procedure(s) Performed: Procedure(s): IRRIGATION AND DEBRIDEMENT FOOT-right foot (Right)  Patient Location: PACU  Anesthesia Type:General  Level of Consciousness: awake, oriented and patient cooperative  Airway & Oxygen Therapy: Patient Spontanous Breathing and Patient connected to face mask oxygen  Post-op Assessment: Report given to RN, Post -op Vital signs reviewed and stable and Patient moving all extremities X 4  Post vital signs: Reviewed and stable  Last Vitals:  Vitals:   02/22/17 2029 02/23/17 0759  BP: 135/66 (!) 154/74  Pulse: 66 73  Resp:  16  Temp: 37.7 C 37.8 C  SpO2: 96% 99%    Last Pain:  Vitals:   02/23/17 0759  TempSrc: Oral  PainSc: 0-No pain      Patients Stated Pain Goal: 2 (41/96/22 2979)  Complications: No apparent anesthesia complications

## 2017-02-23 NOTE — Anesthesia Postprocedure Evaluation (Signed)
Anesthesia Post Note  Patient: Melinda [name]  Procedure(s) Performed: Procedure(s) (LRB): IRRIGATION AND DEBRIDEMENT FOOT-right foot (Right)  Patient location during evaluation: PACU Anesthesia Type: General Level of consciousness: awake and alert Pain management: pain level controlled Vital Signs Assessment: post-procedure vital signs reviewed and stable Respiratory status: spontaneous breathing, nonlabored ventilation, respiratory function stable and patient connected to nasal cannula oxygen Cardiovascular status: blood pressure returned to baseline and stable Postop Assessment: no signs of nausea or vomiting Anesthetic complications: no     Last Vitals:  Vitals:   02/23/17 1015 02/23/17 1045  BP: 138/70 (!) 141/75  Pulse: (!) 151 73  Resp: 15 16  Temp:    SpO2: 100% 94%    Last Pain:  Vitals:   02/23/17 1235  TempSrc:   PainSc: 0-No pain                 Martha Clan

## 2017-02-23 NOTE — Op Note (Signed)
Operative note   Surgeon:Raheel Kunkle Lawyer: None    Preop diagnosis: Abscess right second toe amputation site    Postop diagnosis: Same    Procedure: Incision and drainage deep abscess right second toe amputation site    EBL: Minimal    Anesthesia:local and general    Hemostasis: Epinephrine infiltrated along the incision site diluted 1-200,000    Specimen: Deep space wound culture    Complications: None    Operative indications:Melinda [name] is an 61 y.o. that presents today for surgical intervention.  The risks/benefits/alternatives/complications have been discussed and consent has been given.    Procedure:  Patient was brought into the OR and placed on the operating table in thesupine position. After anesthesia was obtained theright lower extremity was prepped and draped in usual sterile fashion.  Attention was directed to the dorsal aspect of the second toe where previous amputation site had been performed. Open dehisced area with scant amount of purulent drainage was noted. Incision was carried proximal and distal to the dehisced wound. Blunt dissection carried down to the metatarsophalangeal joint region. There was noted some mild necrotic tissue with just a scant amount of purulent drainage. Deep wound culture was performed in this region at this time. Further dissection towards the first metatarsal head was performed. No purulent areas or drainable abscesses were noted in this region. Evaluation plantar to the metatarsal head was negative for abscess. Metatarsal head and cartilage was intact without signs of any erosive changes or obvious gross infection. The wound was then debrided with a versa jet down to the metatarsal head region. This did not include debridement of the metatarsal itself. The wounds and further flushed with 500 amounts of sterile saline. All bleeders were Bovie cauterized as appropriate. Closure was then performed proximal distal with a 3-0 nylon. A  1 cm area was left open in the central aspect to allow for drainage. This was packed with half-inch iodoform dressing. Bulky sterile dressing was then applied.    Patient tolerated the procedure and anesthesia well.  Was transported from the OR to the PACU with all vital signs stable and vascular status intact. To be discharged per routine protocol.  Will follow up in approximately 1 week in the outpatient clinic.

## 2017-02-23 NOTE — Anesthesia Preprocedure Evaluation (Addendum)
Anesthesia Evaluation    History of Anesthesia Complications Negative for: history of anesthetic complications  Airway Mallampati: III       Dental  (+) Dental Advidsory Given, Missing, Poor Dentition   Pulmonary neg shortness of breath, sleep apnea and Continuous Positive Airway Pressure Ventilation , neg COPD, neg recent URI, former smoker,           Cardiovascular Exercise Tolerance: Good (-) hypertension(-) angina+ CAD and + Cardiac Stents  (-) Past MI and (-) CABG + dysrhythmias Supra Ventricular Tachycardia (-) Valvular Problems/Murmurs     Neuro/Psych neg Seizures PSYCHIATRIC DISORDERS Anxiety Depression  Neuromuscular disease    GI/Hepatic negative GI ROS, Neg liver ROS,   Endo/Other  diabetes (diet controlled), Type 2  Renal/GU negative Renal ROS     Musculoskeletal  (+) Arthritis , Fibromyalgia -  Abdominal   Peds  Hematology negative hematology ROS (+)   Anesthesia Other Findings Past Medical History: No date: Anemia     Comment:  years ago No date: Anxiety No date: Blue toes     Comment:  2nd toe on right foot, will get appt. No date: Bulging disc 2009: CAD (coronary artery disease)     Comment:  s/p stent to LAD No date: Cardiac arrhythmia due to congenital heart disease No date: Chicken pox No date: Coronary arteritis No date: Degenerative disc disease No date: Depression No date: Diabetes mellitus without complication (HCC) No date: Facet joint disease (HCC) No date: Fibromyalgia No date: Heart disease No date: Hyperlipidemia No date: IBS (irritable bowel syndrome) No date: MCL deficiency, knee 2011: MRSA (methicillin resistant staph aureus) culture positive     Comment:  GREAT TOE RIGHT FOOT 04/18/2010: Neuropathy No date: Peripheral neuropathy No date: Sleep apnea     Comment:  uses CPAP, sleep study at [facility] No date: Spinal stenosis   Reproductive/Obstetrics negative OB  ROS                            Anesthesia Physical Anesthesia Plan  ASA: III  Anesthesia Plan: General LMA   Post-op Pain Management:    Induction: Intravenous  PONV Risk Score and Plan: 3 and Ondansetron, Dexamethasone and Midazolam  Airway Management Planned: LMA  Additional Equipment:   Intra-op Plan:   Post-operative Plan: Extubation in OR  Informed Consent: I have reviewed the patients History and Physical, chart, labs and discussed the procedure including the risks, benefits and alternatives for the proposed anesthesia with the patient or authorized representative who has indicated his/her understanding and acceptance.   Dental Advisory Given  Plan Discussed with: Anesthesiologist, CRNA and Surgeon  Anesthesia Plan Comments:         Anesthesia Quick Evaluation

## 2017-02-24 ENCOUNTER — Encounter: Payer: Self-pay | Admitting: Podiatry

## 2017-02-24 LAB — GLUCOSE, CAPILLARY
Glucose-Capillary: 120 mg/dL — ABNORMAL HIGH (ref 65–99)
Glucose-Capillary: 122 mg/dL — ABNORMAL HIGH (ref 65–99)
Glucose-Capillary: 117 mg/dL — ABNORMAL HIGH (ref 65–99)
Glucose-Capillary: 134 mg/dL — ABNORMAL HIGH (ref 65–99)

## 2017-02-24 LAB — VANCOMYCIN, TROUGH: Vancomycin Tr: 18 ug/mL (ref 15–20)

## 2017-02-24 MED ORDER — POLYETHYLENE GLYCOL 3350 17 G PO PACK
17.0000 g | PACK | Freq: Every day | ORAL | Status: DC
  Administered 2017-02-24 – 2017-02-25 (×2): 17 g via ORAL
  Filled 2017-02-24 (×2): qty 1

## 2017-02-24 MED ORDER — BISACODYL 5 MG PO TBEC
10.0000 mg | DELAYED_RELEASE_TABLET | Freq: Every day | ORAL | Status: DC | PRN
  Administered 2017-02-24: 10 mg via ORAL
  Filled 2017-02-24 (×5): qty 2

## 2017-02-24 MED ORDER — MENTHOL 3 MG MT LOZG
1.0000 | LOZENGE | OROMUCOSAL | Status: DC | PRN
  Filled 2017-02-24: qty 9

## 2017-02-24 NOTE — Progress Notes (Signed)
ANTIBIOTIC CONSULT NOTE -FOLLOW UP   Pharmacy Consult for vancomycin Indication: osteomyelitis   No Known Allergies  Patient Measurements: Height: 5\' 10"  (177.8 cm) Weight: 230 lb (104.3 kg) IBW/kg (Calculated) : 68.5 Adjusted Body Weight: 82.82 kg  Vital Signs: Temp: 98.3 F (36.8 C) (08/26 1332) Temp Source: Oral (08/26 1332) BP: 136/77 (08/26 1332) Pulse Rate: 61 (08/26 1332) Intake/Output from previous day: 08/25 0701 - 08/26 0700 In: 2330 [P.O.:240; I.V.:775; IV Piggyback:500] Out: 0762 [Urine:1700; Blood:5] Intake/Output from this shift: Total I/O In: -  Out: 600 [Urine:600]  Labs:  Recent Labs  02/22/17 1428 02/23/17 0321  WBC  --  5.9  HGB  --  11.3*  PLT  --  157  CREATININE 0.99 0.97   Estimated Creatinine Clearance: 81.6 mL/min (by C-G formula based on SCr of 0.97 mg/dL).  Recent Labs  02/24/17 1127  [place] 18     Microbiology: Recent Results (from the past 720 hour(s))  Surgical pcr screen     Status: None   Collection Time: 01/31/17 12:36 PM  Result Value Ref Range Status   MRSA, PCR NEGATIVE NEGATIVE Final   Staphylococcus aureus NEGATIVE NEGATIVE Final    Comment:        The Xpert SA Assay (FDA approved for NASAL specimens in patients over 29 years of age), is one component of a comprehensive surveillance program.  Test performance has been validated by [facility] for patients greater than or equal to 79 year old. It is not intended to diagnose infection nor to guide or monitor treatment.   Surgical pcr screen     Status: Abnormal   Collection Time: 02/22/17  5:31 PM  Result Value Ref Range Status   MRSA, PCR NEGATIVE NEGATIVE Final   Staphylococcus aureus POSITIVE (A) NEGATIVE Final    Comment:        The Xpert SA Assay (FDA approved for NASAL specimens in patients over 47 years of age), is one component of a comprehensive surveillance program.  Test performance has been validated by [facility] for patients  greater than or equal to 81 year old. It is not intended to diagnose infection nor to guide or monitor treatment.   Aerobic/Anaerobic Culture (surgical/deep wound)     Status: None (Preliminary result)   Collection Time: 02/23/17  9:36 AM  Result Value Ref Range Status   Specimen Description WOUND RIGHT TOE  Final   Special Requests AMPUTATION  Final   Gram Stain   Final    FEW WBC PRESENT, PREDOMINANTLY PMN FEW GRAM POSITIVE COCCI IN PAIRS IN CLUSTERS    Culture   Final    ABUNDANT UNIDENTIFIED ORGANISM Performed at [facility], [place] [address] [place], [place] [address]    Report Status PENDING  Incomplete    Medical History: Past Medical History:  Diagnosis Date  . Anemia    years ago  . Anxiety   . Blue toes    2nd toe on right foot, will get appt.  . Bulging disc   . CAD (coronary artery disease) 2009   s/p stent to LAD  . 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 Chicken pox   . Coronary arteritis   . Degenerative disc disease   . Depression   . Diabetes mellitus without complication ([place])   . Facet joint disease ([place])   . Fibromyalgia   . Heart disease   . Hyperlipidemia   . IBS (irritable bowel syndrome)   .  MCL deficiency, knee   . MRSA (methicillin resistant staph aureus) culture positive 2011   GREAT TOE RIGHT FOOT  . Neuropathy 04/18/2010  . Peripheral neuropathy   . Sleep apnea    uses CPAP, sleep study at [facility]  . Spinal stenosis     Assessment:to dose and manage vancomycin for this patient.    Goal of Therapy:  Vancomycin trough level 15-20 mcg/ml  Plan:  Vancomycin Trough level resulted @ 18. Will continue current dosing of Vancomycin 1250 mg IV q12 hours.     Larene Beach, PharmD  02/24/2017,1:41 PM

## 2017-02-24 NOTE — Progress Notes (Signed)
Pt complains of pain and mild swelling to right jawline. Stated she had a recent dental procedure and a tooth was pulled on that side. Will notify MD at rounds.

## 2017-02-24 NOTE — Progress Notes (Signed)
Offered patient bisacodyl for constipation. She declined at this time since she has had lactulose and Miralax today. She said she would ask nurse when ready.

## 2017-02-24 NOTE — Progress Notes (Signed)
[facility] [place] NAME: Melinda [name]    MR#:  [id number]  DATE OF BIRTH:  1955-07-23  SUBJECTIVE:  CHIEF COMPLAINT:  No chief complaint on file.  -complains of right neck and jaw pain that started yesterday. No swelling noted. -Still has constipation. Right foot pain is improving  REVIEW OF SYSTEMS:  Review of Systems  Constitutional: Negative for chills and fever.  HENT: Positive for sore throat. Negative for congestion, ear discharge, hearing loss and nosebleeds.   Eyes: Negative for blurred vision and double vision.  Respiratory: Negative for cough, shortness of breath and wheezing.   Cardiovascular: Negative for chest pain, palpitations and leg swelling.  Gastrointestinal: Positive for constipation. Negative for abdominal pain, diarrhea, nausea and vomiting.  Genitourinary: Negative for dysuria and urgency.  Musculoskeletal: Positive for joint pain. Negative for myalgias.  Neurological: Negative for dizziness, seizures and headaches.    DRUG ALLERGIES:  No Known Allergies  VITALS:  Blood pressure 137/76, pulse (!) 57, temperature 98.7 F (37.1 C), temperature source Oral, resp. rate 18, height 5\' 10"  (1.778 m), weight 104.3 kg (230 lb), SpO2 93 %.  PHYSICAL EXAMINATION:  Physical Exam  GENERAL:  61 y.o.-year-old patient lying in the bed with no acute distress.  EYES: Pupils equal, round, reactive to light and accommodation. No scleral icterus. Extraocular muscles intact.  HEENT: Head atraumatic, normocephalic. Oropharynx and nasopharynx clear. Some swelling behind the last molars on the right mandibular region, no erythema or abscess noted NECK:  Supple, no jugular venous distention. No thyroid enlargement, no tenderness. No lymph nodes on exam LUNGS: Normal breath sounds bilaterally, no wheezing, rales,rhonchi or crepitation. No use of accessory muscles of respiration.  CARDIOVASCULAR: S1, S2 normal. No murmurs, rubs, or  gallops.  ABDOMEN: Soft, nontender, nondistended. Bowel sounds present. No organomegaly or mass.  EXTREMITIES: right fore foot dressing in place.. No pedal edema, cyanosis, or clubbing.  NEUROLOGIC: Cranial nerves II through XII are intact. Muscle strength 5/5 in all extremities. Sensation intact except for the feet and lower one third of her legs with decreased sensation to fine touch. Gait not checked.  PSYCHIATRIC: The patient is alert and oriented x 3.  SKIN: No obvious rash, lesion, or ulcer.    LABORATORY PANEL:   CBC  Recent Labs Lab 02/23/17 0321  WBC 5.9  HGB 11.3*  HCT 32.7*  PLT 157   ------------------------------------------------------------------------------------------------------------------  Chemistries   Recent Labs Lab 02/23/17 0321  NA 141  K 4.5  CL 108  CO2 28  GLUCOSE 147*  BUN 24*  CREATININE 0.97  CALCIUM 8.4*   ------------------------------------------------------------------------------------------------------------------  Cardiac Enzymes No results for input(s): TROPONINI in the last 168 hours. ------------------------------------------------------------------------------------------------------------------  RADIOLOGY:  Mr Foot Right Wo Contrast  Result Date: 02/22/2017 CLINICAL DATA:  Patient status post amputation of the right second toe for gangrene 02/01/2017. The patient experienced a small dehiscence of the surgical wound 2 days after surgery. She began to develop fever, chills in the malaise 02/21/2017. EXAM: MRI OF THE RIGHT FOREFOOT WITHOUT CONTRAST TECHNIQUE: Multiplanar, multisequence MR imaging of the right forefoot was performed. No intravenous contrast was administered. COMPARISON:  Plain films right foot earlier today and 09/20/2014. FINDINGS: Bones/Joint/Cartilage The patient is status post amputation of both the first and second toes at the level of the MTP joint. There is no bone marrow signal abnormality to suggest  osteomyelitis. Minimal edema in the medial sesamoid bone is compatible with sesamoid dysfunction. The patient is status  post hallux valgus repair. Ligaments Intact. Muscles and Tendons Atrophy of intrinsic musculature the foot is noted. No intramuscular fluid collection. Soft tissues A small skin wound is seen on the dorsum of the foot at the amputation site of the second toe. Subjacent to the wound, a fluid collection measuring 0.5 cm craniocaudal by 0.9 cm transverse by 0.1 cm long is contiguous with the head of the first metatarsal and appears to communicate with the skin wound. Mild soft tissue edema is seen at the amputation site. IMPRESSION: Status post amputation of the first and second toes. There is a small skin wound in the dorsal soft tissues at the amputation site of the second toe. Tiny underlying fluid collection is contiguous with the first metatarsal head. This fluid could be due to postoperative seroma or infection. The examination is negative for osteomyelitis. Electronically Signed   By: Inge Rise M.D.   On: 02/22/2017 14:06    EKG:   Orders placed or performed during the hospital encounter of 01/31/17  . EKG 12-Lead  . EKG 12-Lead    ASSESSMENT AND PLAN:   Melinda [name]  is a 61 y.o. female with a known history of fibromyalgia, chronic low back pain status post surgeries for degenerative disc disease on a pain pump, CAD, depression, diabetic neuropathy, diabetes mellitus presents to the hospital from podiatry office secondary to worsening postop changes to her right foot.  #1 cellulitis of right distal foot- at the site of recent second toe amputation. -MRI with no osteomyelitis, possible abscess noted, appreciate podiatry consult. -Status post I & D and some purulent material and necrotic tissue cleaned out  -on vancomycin at this time until culture results are available.outpatientcultures with Streptococcus -keep the leg elevated. Dressing instructions and  weight-bearing status as per podiatry -dressing change and packing done today. Anticipate discharge tomorrow -IV fluids-can be stopped today  #2 fibromyalgia and chronic low back pain-follows with pain management clinic. -Has a pain pump with bupivacaine, baclofen and fentanyl. Continue outpatient pain medications.  #3 Diabetes mellitus-diet control. A1c of 5.8 and on sliding scale insulin.  #4 hyperlipidemia-on statin  #5 DVT prophylaxis-Lovenox  #6 constipation-medications added   All the records are reviewed and case discussed with Care Management/Social Workerr. Management plans discussed with the patient, family and they are in agreement.  CODE STATUS: Full Code  TOTAL TIME TAKING CARE OF THIS PATIENT: 36 minutes.   POSSIBLE D/C TOMORROW, DEPENDING ON CLINICAL CONDITION.   Melinda [name] M.D on 02/24/2017 at 12:07 PM  Between 7am to 6pm - Pager - [phone]  After 6pm go to [email or web address] - password [email or web address] [facility]  Office  [phone]  CC: Primary care physician; Jearld Fenton, NP

## 2017-02-24 NOTE — Progress Notes (Signed)
Daily Progress Note   Subjective  - 1 Day Post-Op  F/u I & D.  Doing well.  No fever.  Minimal pain.  Objective Vitals:   02/23/17 1015 02/23/17 1045 02/23/17 1937 02/24/17 0805  BP: 138/70 (!) 141/75 122/70 137/76  Pulse: (!) 151 73 (!) 53 (!) 57  Resp: 15 16 20 18   Temp:   98.8 F (37.1 C) 98.7 F (37.1 C)  TempSrc:   Oral Oral  SpO2: 100% 94% 98% 93%  Weight:      Height:        Physical Exam: Wound erythema improved.  No purulence.  Packing removed.  Laboratory CBC    Component Value Date/Time   WBC 5.9 02/23/2017 0321   HGB 11.3 (L) 02/23/2017 0321   HGB 14.0 11/12/2011 1136   HCT 32.7 (L) 02/23/2017 0321   HCT 40.7 11/12/2011 1136   PLT 157 02/23/2017 0321   PLT 274 11/12/2011 1136    BMET    Component Value Date/Time   NA 141 02/23/2017 0321   NA 135 (L) 06/30/2013 1203   K 4.5 02/23/2017 0321   K 4.1 06/30/2013 1203   CL 108 02/23/2017 0321   CL 102 06/30/2013 1203   CO2 28 02/23/2017 0321   CO2 32 06/30/2013 1203   GLUCOSE 147 (H) 02/23/2017 0321   GLUCOSE 143 (H) 06/30/2013 1203   BUN 24 (H) 02/23/2017 0321   BUN 19 (H) 06/30/2013 1203   CREATININE 0.97 02/23/2017 0321   CREATININE 0.89 06/30/2013 1203   CALCIUM 8.4 (L) 02/23/2017 0321   CALCIUM 9.7 06/30/2013 1203   GFRNONAA >60 02/23/2017 0321   GFRNONAA >60 06/30/2013 1203   GFRAA >60 02/23/2017 0321   GFRAA >60 06/30/2013 1203    Assessment/Planning: Abscess right foot DM with neuropathy   Dressing changed and repacked.  WBC normal  Culture with GPC's. Outpt with [facility]  Will need home health for dressing changes.  Suspect can d/c home tomorrow after podiatry evaluation.  OK for WB to heel.  Will have PT evaluate.   Samara Deist A  02/24/2017, 9:36 AM

## 2017-02-25 LAB — GLUCOSE, CAPILLARY
Glucose-Capillary: 119 mg/dL — ABNORMAL HIGH (ref 65–99)
Glucose-Capillary: 145 mg/dL — ABNORMAL HIGH (ref 65–99)

## 2017-02-25 MED ORDER — AMOXICILLIN-POT CLAVULANATE 875-125 MG PO TABS
1.0000 | ORAL_TABLET | Freq: Two times a day (BID) | ORAL | Status: DC
  Administered 2017-02-25: 1 via ORAL
  Filled 2017-02-25 (×2): qty 1

## 2017-02-25 MED ORDER — AMOXICILLIN-POT CLAVULANATE 875-125 MG PO TABS: 1.0000 | ORAL_TABLET | Freq: Two times a day (BID) | ORAL | 0 refills | Status: AC

## 2017-02-25 MED ORDER — BISACODYL 10 MG RE SUPP
10.0000 mg | Freq: Once | RECTAL | Status: AC
  Administered 2017-02-25: 10 mg via RECTAL
  Filled 2017-02-25: qty 1

## 2017-02-25 NOTE — Care Management Note (Signed)
Case Management Note  Patient Details  Name: Melinda [name] MRN: [id number] Date of Birth: 1955-12-16  Subjective/Objective:  Admitted with cellulitis of right foot, s/p 2nd toe amputation on Saturday. Patient will be discharged today. Will require daily dressing changes per Dr. Elvina Mattes. Patient agreeable to home health  But understands that they will be coming out to teach her not to do daily dressing changes. Spoke with Dr. Tressia Miners and she will place home health orders with dressing changes orders. No agency preference. Referral to [facility] for [facility]. Prior to admission, patient was independent, active and driving. She has someone that stays with her at night that can help her with her dressing changes if needed.                   Action/Plan: [facility] for nursing and dressing changes.   Expected Discharge Date:                  Expected Discharge Plan:  [name]  In-House Referral:  Clinical Social Work  Discharge planning Services  CM Consult  Post Acute Care Choice:  Home Health Choice offered to:  Patient  DME Arranged:    DME Agency:     HH Arranged:  RN [place] Agency:  [place]  Status of Service:  Completed, signed off  If discussed at [employer] of Stay Meetings, dates discussed:    Additional Comments:  Jolly Mango, RN 02/25/2017, 1:26 PM

## 2017-02-25 NOTE — Evaluation (Signed)
Physical Therapy Evaluation Patient Details Name: Melinda [name] MRN: [id number] DOB: 1955-10-07 Today's Date: 02/25/2017   History of Present Illness  presented to ER secondary to R foot swelling with fever/redness and chills, unable to WB; admitted with cellulitis at recent 2nd toe amputation site.  Now status post irrigation and debridement of deep abscess (8/25) at previous amputation site.  Clinical Impression  Patient demonstrates ability to complete bed mobility, basic transfers and household-distance gait (50') with R post-op shoe, mod indep.  No additional assist devices required.  Good understanding/adherence to heel WBing R LE with no additional questions/concerns noted.  Patient has been mobilizing at similar functional level (with similar restrictions) since original surgery/amputation approx 3 weeks prior. No skilled PT needs identified at this time.  Will complete initial order; please re-consult should needs change.    Follow Up Recommendations No PT follow up    Equipment Recommendations       Recommendations for Other Services       Precautions / Restrictions Precautions Precaution Comments: Heel WBing with post-op shoe      Mobility  Bed Mobility Overal bed mobility: Modified Independent                Transfers Overall transfer level: Modified independent Equipment used: None                Ambulation/Gait Ambulation/Gait assistance: Modified independent (Device/Increase time) Ambulation Distance (Feet): 50 Feet Assistive device: None       General Gait Details: reciprocal stepping pattern wtih good cadence, gait speed; good demonstration/adherence of heel WBing to L foot.  Does maintain R knee in partial flexion throughout gait cycle, but no overt buckling or LOB  Stairs            Wheelchair Mobility    Modified Rankin (Stroke Patients Only)       Balance Overall balance assessment: Modified Independent                                            Pertinent Vitals/Pain Pain Assessment: Faces Faces Pain Scale: Hurts a little bit Pain Location: R foot Pain Descriptors / Indicators: Aching Pain Intervention(s): Limited activity within patient's tolerance;Monitored during session;Repositioned    Home Living Family/patient expects to be discharged to:: Private residence Living Arrangements: Spouse/significant other Available Help at Discharge: Family Type of Home: House Home Access: Stairs to enter Entrance Stairs-Rails: Right;Left;Can reach both Technical brewer of Steps: 5 Home Layout: One level Home Equipment: None      Prior Function Level of Independence: Independent         Comments: Indep with ADls, household and community mobility     Hand Dominance        Extremity/Trunk Assessment   Upper Extremity Assessment Upper Extremity Assessment: Overall WFL for tasks assessed    Lower Extremity Assessment Lower Extremity Assessment: Overall WFL for tasks assessed (R foot not tested with resistance due to surgical site; baseline paresthesia due to neuropathy)       Communication   Communication: No difficulties  Cognition Arousal/Alertness: Awake/alert Behavior During Therapy: WFL for tasks assessed/performed Overall Cognitive Status: Within Functional Limits for tasks assessed  General Comments      Exercises Other Exercises Other Exercises: Verbally reviewed R foot WBing restrictions, activity limitations (bathroom privileges per podiatry) and importance for activty pacing; patient voiced understanding.   Assessment/Plan    PT Assessment Patent does not need any further PT services  PT Problem List         PT Treatment Interventions      PT Goals (Current goals can be found in the Care Plan section)  Acute Rehab PT Goals Patient Stated Goal: to return home PT Goal Formulation: All assessment  and education complete, DC therapy    Frequency     Barriers to discharge        Co-evaluation               AM-PAC PT "6 Clicks" Daily Activity  Outcome Measure Difficulty turning over in bed (including adjusting bedclothes, sheets and blankets)?: None Difficulty moving from lying on back to sitting on the side of the bed? : None Difficulty sitting down on and standing up from a chair with arms (e.g., wheelchair, bedside commode, etc,.)?: None Help needed moving to and from a bed to chair (including a wheelchair)?: None Help needed walking in hospital room?: None Help needed climbing 3-5 steps with a railing? : A Little 6 Click Score: 23    End of Session Equipment Utilized During Treatment: Gait belt Activity Tolerance: Patient tolerated treatment well Patient left: in bed;with call bell/phone within reach;with family/visitor present Nurse Communication: Mobility status PT Visit Diagnosis: Difficulty in walking, not elsewhere classified (R26.2)    Time: 1220-1230 PT Time Calculation (min) (ACUTE ONLY): 10 min   Charges:   PT Evaluation $PT Eval Low Complexity: 1 Low     PT G Codes:   PT G-Codes **NOT FOR INPATIENT CLASS** Functional Assessment Tool Used: AM-PAC 6 Clicks Basic Mobility Functional Limitation: Mobility: Walking and moving around Mobility: Walking and Moving Around Current Status (O8416): At least 1 percent but less than 20 percent impaired, limited or restricted Mobility: Walking and Moving Around Goal Status [phone]): At least 1 percent but less than 20 percent impaired, limited or restricted Mobility: Walking and Moving Around Discharge Status [id number]): At least 1 percent but less than 20 percent impaired, limited or restricted    Leana Springston H. Owens Shark, PT, DPT, NCS 02/25/17, 1:57 PM [phone]

## 2017-02-25 NOTE — Progress Notes (Signed)
Patient Demographics  Melinda [name], is a 61 y.o. female   MRN: [id number]   DOB - Aug 07, 1955  Admit Date - 02/22/2017    Outpatient Primary MD for the patient is Jearld Fenton, NP  Consult requested in the Hospital by Gladstone Lighter, MD, On 02/25/2017    With History of -  Past Medical History:  Diagnosis Date  . Anemia    years ago  . Anxiety   . Blue toes    2nd toe on right foot, will get appt.  . Bulging disc   . CAD (coronary artery disease) 2009   s/p stent to LAD  . 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 Chicken pox   . Coronary arteritis   . Degenerative disc disease   . Depression   . Diabetes mellitus without complication ([place])   . Facet joint disease ([place])   . Fibromyalgia   . Heart disease   . Hyperlipidemia   . IBS (irritable bowel syndrome)   . MCL deficiency, knee   . MRSA (methicillin resistant staph aureus) culture positive 2011   GREAT TOE RIGHT FOOT  . Neuropathy 04/18/2010  . Peripheral neuropathy   . Sleep apnea    uses CPAP, sleep study at [facility]  . Spinal stenosis       Past Surgical History:  Procedure Laterality Date  . ABLATION     UTERUS  . ABLATION     HEART  . AMPUTATION TOE Right 02/01/2017   Procedure: AMPUTATION TOE-RIGHT 2ND MPJ;  Surgeon: Albertine Patricia, DPM;  Location: [facility];  Service: Podiatry;  Laterality: Right;  . ANTERIOR CERVICAL DECOMP/DISCECTOMY FUSION N/A 07/28/2014   Procedure: ANTERIOR CERVICAL DECOMPRESSION/DISCECTOMY FUSION CERVICAL 3-4,4-5,5-6 LEVELS WITH INSTRUMENTATION AND ALLOGRAFT;  Surgeon: Sinclair Ship, MD;  Location: [place];  Service: Orthopedics;  Laterality: N/A;  Anterior cervical decompression fusion, cervical 3-4, cervical 4-5, cervical 5-6 with instrumentation and allograft  . BACK SURGERY      X 3 1979, 1994, 1995  . CARPAL TUNNEL RELEASE Right   . CHOLECYSTECTOMY  2003  . CORONARY ANGIOPLASTY    . EYE SURGERY Bilateral 2013   Eyelid lift   . FOOT SURGERY Right    BIG TOE  . FOOT SURGERY Bilateral    PLANTAR FASCIITIS  . FOOT SURGERY Right    2ND TOE  . GALLBLADDER SURGERY    . HAND SURGERY Left   . HAND SURGEY Left   . HEART STENT  2009   LAD  . INFUSION PUMP IMPLANTATION     X2 with morphine and baclofen  . IRRIGATION AND DEBRIDEMENT FOOT Right 02/23/2017   Procedure: IRRIGATION AND DEBRIDEMENT FOOT-right foot;  Surgeon: Samara Deist, DPM;  Location: [facility];  Service: Podiatry;  Laterality: Right;    in for   No chief complaint on file.    HPI  Melinda [name]  is a 61 y.o. female, Secondary status post I&D of infection which occurred after amputation of the second toe. Show run a fever last week and was not responding to oral antibiotics so we admitted her through direct admission and Dr. Vickki Muff on incise and drain the wound on Saturday.  This is at the base of the second toe of the right foot amputation site.    Review of Systems    In addition to the HPI above,  No Fever-chills, No Headache, No changes with Vision or hearing, No problems swallowing food or Liquids, No Chest pain, Cough or Shortness of Breath, No Abdominal pain, No Nausea or Vommitting, Bowel movements are regular, No Blood in stool or Urine, No dysuria, No new skin rashes or bruises, No new joints pains-aches,  No new weakness, tingling, numbness in any extremity, No recent weight gain or loss, No polyuria, polydypsia or polyphagia, No significant Mental Stressors.  A full 10 point Review of Systems was done, except as stated above, all other Review of Systems were negative.   Social History Social History  Substance Use Topics  . Smoking status: Former Smoker    Packs/day: 1.50    Years: 32.00    Types: Cigarettes    Quit date: 07/22/2007  . Smokeless tobacco: Never  Used  . Alcohol use 0.0 oz/week     Comment: occ     Family History Family History  Problem Relation Age of Onset  . Lung cancer Mother   . Diabetes Father   . Heart disease Maternal Grandfather   . Diabetes Paternal Grandmother   . Colon cancer Paternal Grandfather   . Stroke Neg Hx      Prior to Admission medications   Medication Sig Start Date End Date Taking? Authorizing Provider  ACCU-CHEK SOFTCLIX LANCETS lancets Use to check blood sugar 2 times daily.  Dx: E11.42 01/21/17  Yes Jearld Fenton, NP  buPROPion (WELLBUTRIN XL) 150 MG 24 hr tablet TAKE 1 TABLET BY MOUTH DAILY 01/28/17  Yes Jearld Fenton, NP  Cholecalciferol (VITAMIN D3) 2000 units TABS Take 2,000 Units by mouth daily.   Yes [provider]  acetaminophen (TYLENOL) 500 MG tablet Take 1,000 mg by mouth every 6 (six) hours as needed (for pain/headache.).    [provider]  AMBULATORY NON FORMULARY MEDICATION Medication Name: CPAP MASK OF CHOICE FOR HOME DEVICE 07/01/15   Laverle Hobby, MD  amoxicillin-clavulanate (AUGMENTIN) 875-125 MG tablet Take 1 tablet by mouth every 12 (twelve) hours. X 10 days 02/25/17 03/07/17  Gladstone Lighter, MD  aspirin 81 MG tablet Take 81 mg by mouth daily.    [provider]  Chromium Picolinate 800 MCG TABS Take 800 mcg by mouth daily.    [provider]  Cinnamon Bark POWD Take 1 capsule by mouth daily.    [provider]  Coenzyme Q10 (CO Q 10) 100 MG CAPS Take 200 mg by mouth daily.     [provider]  doxycycline (VIBRA-TABS) 100 MG tablet Take 100 mg by mouth 2 (two) times daily.    [provider]  glucosamine-chondroitin 500-400 MG tablet Take 1 tablet by mouth daily.     [provider]  glucose blood (ACCU-CHEK AVIVA PLUS) test strip TEST 2 TIMES DAY  **E11.9** 12/11/16   Baity, Coralie Keens, NP  glucose blood test strip 1 each by Other route. Use as instructed    [provider]  GRAPE SEED  EXTRACT PO Take 1 capsule by mouth daily.    [provider]  ibuprofen (ADVIL,MOTRIN) 200 MG tablet Take 600 mg by mouth every 8 (eight) hours as needed (for pain.).    [provider]  MAGNESIUM MALATE PO Take 115 mg by mouth daily.    [provider]  meloxicam (MOBIC) 15 MG tablet Take 1 tablet (15 mg total) by mouth daily. 02/04/17 05/05/17  [name], MD  Multiple Vitamin (MULTIVITAMIN) tablet Take 1 tablet by mouth daily.    [provider]  nitroGLYCERIN (NITROSTAT) 0.4 MG SL tablet Place 1 tablet (0.4 mg total) under the tongue every 5 (five) minutes as needed for chest pain. 05/21/16   Jerline Pain, MD  Omega-3 Fatty Acids (FISH OIL) 1200 MG CAPS Take 1,200 mg by mouth daily.     [provider]  oxyCODONE (OXY IR/ROXICODONE) 5 MG immediate release tablet Take 1 tablet (5 mg total) by mouth every 8 (eight) hours as needed for severe pain. Patient not taking: Reported on 01/30/2017 03/06/17 04/05/17  [name], MD  oxyCODONE (OXY IR/ROXICODONE) 5 MG immediate release tablet Take 1 tablet (5 mg total) by mouth every 8 (eight) hours as needed for severe pain. Patient not taking: Reported on 01/30/2017 04/05/17 05/05/17  [name], MD  oxyCODONE (OXY IR/ROXICODONE) 5 MG immediate release tablet Take 1 tablet (5 mg total) by mouth every 8 (eight) hours as needed for severe pain. 02/04/17 03/06/17  [name], MD  oxyCODONE (ROXICODONE) 5 MG immediate release tablet Take 1 tablet (5 mg total) by mouth every 4 (four) hours as needed for severe pain. 02/01/17   Albertine Patricia, DPM  PAIN MANAGEMENT IT PUMP REFILL 1 each by Intrathecal route once. Medication: PF Fentanyl 500.0 mcg/ml PF Baclofen 300.48mcg/ml PF Bupivicaine 20.0 mg/ml Total Volume: 40 ml Needed by 12-11-16 @1000  Patient taking differently: 1 each by Intrathecal route once. Medication: PF Fentanyl 147.95 mcg/24 hr PF Baclofen 88.26mcg/24hr PF Bupivicaine 5.918 mg/24  hr 11/30/16 01/30/18  [name], MD  PARoxetine (PAXIL) 20 MG tablet TAKE ONE-HALF OF A TABLET BY MOUTH TWICEDAILY Patient taking differently: TAKE ONE-HALF OF A TABLET BY MOUTH DAILY IN THE MORNING 06/22/16   Baity, Coralie Keens, NP  polyethylene glycol (MIRALAX / GLYCOLAX) packet Take 17 g by mouth daily as needed (for constipation.).     [provider]  pregabalin (LYRICA) 100 MG capsule Take 1 capsule (100 mg total) by mouth 3 (three) times daily. 02/04/17 05/05/17  [name], MD  rosuvastatin (CRESTOR) 20 MG tablet Take 1 tablet (20 mg total) by mouth daily. 05/21/16   Jerline Pain, MD  tiZANidine (ZANAFLEX) 4 MG capsule Take 1 capsule (4 mg total) by mouth 3 (three) times daily. 02/04/17 05/05/17  [name], MD  vitamin E 400 UNIT capsule Take 400 Units by mouth daily.    [provider]    Anti-infectives    Start     Dose/Rate Route Frequency Ordered Stop   02/25/17 0000  amoxicillin-clavulanate (AUGMENTIN) 875-125 MG tablet     1 tablet Oral Every 12 hours 02/25/17 1115 03/07/17 2359   02/23/17 0600  ceFAZolin (ANCEF) IVPB 2g/100 mL premix     2 g 200 mL/hr over 30 Minutes Intravenous On call to O.R. 02/23/17 0434 02/23/17 0955   02/22/17 2100  vancomycin (VANCOCIN) 1,250 mg in sodium chloride 0.9 % 250 mL IVPB     1,250 mg 166.7 mL/hr over 90 Minutes Intravenous Every 12 hours 02/22/17 1632     02/22/17 1400  vancomycin (VANCOCIN) 1,500 mg in sodium chloride 0.9 % 500 mL IVPB     1,500 mg 250 mL/hr over 120 Minutes Intravenous  Once 02/22/17 1246 02/22/17 1706      Scheduled Meds: . buPROPion  150 mg Oral Daily  . Chlorhexidine Gluconate  Cloth  6 each Topical Daily  . cholecalciferol  2,000 Units Oral Daily  . enoxaparin (LOVENOX) injection  40 mg Subcutaneous Q24H  . insulin aspart  0-5 Units Subcutaneous QHS  . insulin aspart  0-9 Units Subcutaneous TID WC  . meloxicam  15 mg Oral Daily  . multivitamin with minerals  1 tablet Oral Daily   . mupirocin ointment  1 application Nasal BID  . omega-3 acid ethyl esters  1 g Oral Daily  . PARoxetine  10 mg Oral Daily  . polyethylene glycol  17 g Oral Daily  . pregabalin  100 mg Oral TID  . rosuvastatin  20 mg Oral q1800  . tiZANidine  4 mg Oral TID  . vitamin E  400 Units Oral Daily   Continuous Infusions: . vancomycin Stopped (02/25/17 0320)   PRN Meds:.acetaminophen, bisacodyl, menthol-cetylpyridinium, nitroGLYCERIN, ondansetron **OR** ondansetron (ZOFRAN) IV, oxyCODONE  No Known Allergies  Physical Exam  Vitals  Blood pressure 130/69, pulse 61, temperature 98.9 F (37.2 C), temperature source Oral, resp. rate 16, height 5\' 10"  (1.778 m), weight 104.3 kg (230 lb), SpO2 94 %.  Lower Extremity exam: Just removed today and the wounds inspected. The wound is closed proximally and distally with a small 1 cm area open on the dorsum of the foot where there is iodoform gauze packing. This is removed. Tissues looked clean underneath and there is no heavy drainage on the packing area. No frank purulence. The erythema around the wound is vastly improved there is no evidence of spreading cellulitis. The wound is about 1.8 cm deep and this was repacked again today with the iodoform gauze.   Data Review  CBC  Recent Labs Lab 02/23/17 0321  WBC 5.9  HGB 11.3*  HCT 32.7*  PLT 157  MCV 85.3  MCH 29.4  MCHC 34.4  RDW 13.2   ------------------------------------------------------------------------------------------------------------------  Chemistries   Recent Labs Lab 02/22/17 1428 02/23/17 0321  NA  --  141  K  --  4.5  CL  --  108  CO2  --  28  GLUCOSE  --  147*  BUN  --  24*  CREATININE 0.99 0.97  CALCIUM  --  8.4*   --------------------------------------------------------------------------------------- Assessment & Plan: Foot appears to be stable at this point with the infection under much better control. X-rays and MRI did not appear to indicate infection in  the metatarsal head at all. Patient's white count has remained normal and her blood sugars are stable. She is not running a fever any longer. Plan: Repack the wound today. She's should be on a go home today on oral antibiotics. Culture from the office showed streptococcal infection. Should be responsive to any penicillin. Augmentin would probably be a good choice. Patient needs daily wound packing and home health care to that effect. She should make an appointment to follow up with me next Tuesday. Patient has a postop shoe. In addition daily packing she needs a sterile dressing placed across the area on a daily basis also.  Active Problems:   Osteomyelitis [facility])  Family Communication: Plan discussed with patient    Perry Mount M.D on 02/25/2017 at 12:14 PM  Thank you for the consult, we will follow the patient with you in the Hospital.

## 2017-02-25 NOTE — Discharge Summary (Signed)
[facility] NAME: Melinda [name]    MR#:  [id number]  DATE OF BIRTH:  Apr 10, 1956  DATE OF ADMISSION:  02/22/2017   ADMITTING PHYSICIAN: Gladstone Lighter, MD  DATE OF DISCHARGE: 02/25/17  PRIMARY CARE PHYSICIAN: Jearld Fenton, NP   ADMISSION DIAGNOSIS:   Cellulitis cellulitis  DISCHARGE DIAGNOSIS:   Active Problems:   Osteomyelitis ([place])   SECONDARY DIAGNOSIS:   Past Medical History:  Diagnosis Date  . Anemia    years ago  . Anxiety   . Blue toes    2nd toe on right foot, will get appt.  . Bulging disc   . CAD (coronary artery disease) 2009   s/p stent to LAD  . 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 Chicken pox   . Coronary arteritis   . Degenerative disc disease   . Depression   . Diabetes mellitus without complication ([place])   . Facet joint disease ([place])   . Fibromyalgia   . Heart disease   . Hyperlipidemia   . IBS (irritable bowel syndrome)   . MCL deficiency, knee   . MRSA (methicillin resistant staph aureus) culture positive 2011   GREAT TOE RIGHT FOOT  . Neuropathy 04/18/2010  . Peripheral neuropathy   . Sleep apnea    uses CPAP, sleep study at [facility]  . Spinal stenosis     HOSPITAL COURSE:   Melinda [name] a 61 y.o. femalewith a known history of fibromyalgia, chronic low back pain status post surgeries for degenerative disc disease on a pain pump, CAD, depression, diabetic neuropathy, diabetes mellitus presents to the hospital from podiatry office secondary to worsening postop changes to her right foot.  #1 cellulitis of right distal foot- at the site of recent second toe amputation. -MRI with no osteomyelitis, possible abscess noted, appreciate podiatry consult. -Status post I & D and some purulent material and necrotic tissue cleaned out  -on vancomycin at this time - cultures growing group C streptococcus - Based on the dressing changes today, if wound  appears good, will discharge on oral Augmentin -keep the leg elevated. Weight bearing on heel only as tolerated -Possible discharge today  #2 fibromyalgia and chronic low back pain-follows with pain management clinic. -Has a pain pump with bupivacaine, baclofen and fentanyl. Continue outpatient pain medications.  #3 Diabetes mellitus-diet control. A1c of 5.8  #4 hyperlipidemia-on statin  #5 constipation-medications added- advised to take laxatives as Patient is being on medications  Possible discharge today with home health  DISCHARGE CONDITIONS:   Guarded  CONSULTS OBTAINED:   Podiatry consult by Dr. Vickki Muff  DRUG ALLERGIES:   No Known Allergies DISCHARGE MEDICATIONS:   Allergies as of 02/25/2017   No Known Allergies     Medication List    STOP taking these medications   doxycycline 100 MG tablet Commonly known as:  VIBRA-TABS     TAKE these medications   ACCU-CHEK SOFTCLIX LANCETS lancets Use to check blood sugar 2 times daily.  Dx: E11.42   acetaminophen 500 MG tablet Commonly known as:  TYLENOL Take 1,000 mg by mouth every 6 (six) hours as needed (for pain/headache.).   AMBULATORY NON FORMULARY MEDICATION Medication Name: CPAP MASK OF CHOICE FOR HOME DEVICE   amoxicillin-clavulanate 875-125 MG tablet Commonly known as:  AUGMENTIN Take 1 tablet by mouth every 12 (twelve) hours. X 10 days   aspirin 81 MG tablet Take 81 mg by mouth daily.  buPROPion 150 MG 24 hr tablet Commonly known as:  WELLBUTRIN XL TAKE 1 TABLET BY MOUTH DAILY   Chromium Picolinate 800 MCG Tabs Take 800 mcg by mouth daily.   Cinnamon Bark Powd Take 1 capsule by mouth daily.   Co Q 10 100 MG Caps Take 200 mg by mouth daily.   Fish Oil 1200 MG Caps Take 1,200 mg by mouth daily.   glucosamine-chondroitin 500-400 MG tablet Take 1 tablet by mouth daily.   glucose blood test strip 1 each by Other route. Use as instructed   glucose blood test strip Commonly known as:   ACCU-CHEK AVIVA PLUS TEST 2 TIMES DAY  **E11.9**   GRAPE SEED EXTRACT PO Take 1 capsule by mouth daily.   ibuprofen 200 MG tablet Commonly known as:  ADVIL,MOTRIN Take 600 mg by mouth every 8 (eight) hours as needed (for pain.).   MAGNESIUM MALATE PO Take 115 mg by mouth daily.   meloxicam 15 MG tablet Commonly known as:  MOBIC Take 1 tablet (15 mg total) by mouth daily.   multivitamin tablet Take 1 tablet by mouth daily.   nitroGLYCERIN 0.4 MG SL tablet Commonly known as:  NITROSTAT Place 1 tablet (0.4 mg total) under the tongue every 5 (five) minutes as needed for chest pain.   oxyCODONE 5 MG immediate release tablet Commonly known as:  ROXICODONE Take 1 tablet (5 mg total) by mouth every 4 (four) hours as needed for severe pain. What changed:  Another medication with the same name was removed. Continue taking this medication, and follow the directions you see here.   PAIN MANAGEMENT IT PUMP REFILL 1 each by Intrathecal route once. Medication: PF Fentanyl 500.0 mcg/ml PF Baclofen 300.80mcg/ml PF Bupivicaine 20.0 mg/ml Total Volume: 40 ml Needed by 12-11-16 @1000  What changed:  additional instructions   PARoxetine 20 MG tablet Commonly known as:  PAXIL TAKE ONE-HALF OF A TABLET BY MOUTH TWICEDAILY What changed:  See the new instructions.   polyethylene glycol packet Commonly known as:  MIRALAX / GLYCOLAX Take 17 g by mouth daily as needed (for constipation.).   pregabalin 100 MG capsule Commonly known as:  LYRICA Take 1 capsule (100 mg total) by mouth 3 (three) times daily.   rosuvastatin 20 MG tablet Commonly known as:  CRESTOR Take 1 tablet (20 mg total) by mouth daily.   tiZANidine 4 MG capsule Commonly known as:  ZANAFLEX Take 1 capsule (4 mg total) by mouth 3 (three) times daily.   Vitamin D3 2000 units Tabs Take 2,000 Units by mouth daily.   vitamin E 400 UNIT capsule Take 400 Units by mouth daily.            Discharge Care Instructions         Start     Ordered   02/25/17 0000  amoxicillin-clavulanate (AUGMENTIN) 875-125 MG tablet  Every 12 hours     02/25/17 1115   02/25/17 0000  Diet - low sodium heart healthy     02/25/17 1115   02/25/17 0000  Activity as tolerated - No restrictions     02/25/17 1115       DISCHARGE INSTRUCTIONS:   1. PCP follow-up in 2 weeks 2. Podiatry follow-up in 1 week  DIET:   Cardiac diet  ACTIVITY:   Activity as tolerated  OXYGEN:   Home Oxygen: No.  Oxygen Delivery: room air  DISCHARGE LOCATION:   home   If you experience worsening of your admission symptoms, develop shortness of  breath, life threatening emergency, suicidal or homicidal thoughts you must seek medical attention immediately by calling 911 or calling your MD immediately  if symptoms less severe.  You Must read complete instructions/literature along with all the possible adverse reactions/side effects for all the Medicines you take and that have been prescribed to you. Take any new Medicines after you have completely understood and accpet all the possible adverse reactions/side effects.   Please note  You were cared for by a hospitalist during your hospital stay. If you have any questions about your discharge medications or the care you received while you were in the hospital after you are discharged, you can call the unit and asked to speak with the hospitalist on call if the hospitalist that took care of you is not available. Once you are discharged, your primary care physician will handle any further medical issues. Please note that NO REFILLS for any discharge medications will be authorized once you are discharged, as it is imperative that you return to your primary care physician (or establish a relationship with a primary care physician if you do not have one) for your aftercare needs so that they can reassess your need for medications and monitor your lab values.    On the day of Discharge:  VITAL SIGNS:   Blood  pressure 130/69, pulse 61, temperature 98.9 F (37.2 C), temperature source Oral, resp. rate 16, height 5\' 10"  (1.778 m), weight 104.3 kg (230 lb), SpO2 94 %.  PHYSICAL EXAMINATION:    GENERAL: 61 y.o.-year-old patient lying in the bed with no acute distress.  EYES: Pupils equal, round, reactive to light and accommodation. No scleral icterus. Extraocular muscles intact.  HEENT: Head atraumatic, normocephalic. Oropharynx and nasopharynx clear. Some swelling behind the last molars on the right mandibular region, no erythema or abscess noted NECK: Supple, no jugular venous distention. No thyroid enlargement, no tenderness. No lymph nodes on exam LUNGS: Normal breath sounds bilaterally, no wheezing, rales,rhonchi or crepitation. No use of accessory muscles of respiration.  CARDIOVASCULAR: S1, S2 normal. No murmurs, rubs, or gallops.  ABDOMEN: Soft, nontender, nondistended. Bowel sounds present. No organomegaly or mass.  EXTREMITIES: right fore foot dressing in place.. No pedal edema, cyanosis, or clubbing.  NEUROLOGIC: Cranial nerves II through XII are intact. Muscle strength 5/5 in all extremities. Sensation intact except for the feet and lower one third of her legs with decreased sensation to fine touch. Gait not checked.  PSYCHIATRIC: The patient is alert and oriented x 3.  SKIN: No obvious rash, lesion, or ulcer.    DATA REVIEW:   CBC  Recent Labs Lab 02/23/17 0321  WBC 5.9  HGB 11.3*  HCT 32.7*  PLT 157    Chemistries   Recent Labs Lab 02/23/17 0321  NA 141  K 4.5  CL 108  CO2 28  GLUCOSE 147*  BUN 24*  CREATININE 0.97  CALCIUM 8.4*     Microbiology Results  Results for orders placed or performed during the hospital encounter of 02/22/17  Surgical pcr screen     Status: Abnormal   Collection Time: 02/22/17  5:31 PM  Result Value Ref Range Status   MRSA, PCR NEGATIVE NEGATIVE Final   Staphylococcus aureus POSITIVE (A) NEGATIVE Final    Comment:        The  Xpert SA Assay (FDA approved for NASAL specimens in patients over 65 years of age), is one component of a comprehensive surveillance program.  Test performance has been validated by [facility] for patients greater than or equal to 73 year old. It is not intended to diagnose infection nor to guide or monitor treatment.   Aerobic/Anaerobic Culture (surgical/deep wound)     Status: None (Preliminary result)   Collection Time: 02/23/17  9:36 AM  Result Value Ref Range Status   Specimen Description WOUND RIGHT TOE  Final   Special Requests AMPUTATION  Final   Gram Stain   Final    FEW WBC PRESENT, PREDOMINANTLY PMN FEW GRAM POSITIVE COCCI IN PAIRS IN CLUSTERS Performed at [facility], [address], [place], [place] [address]    Culture ABUNDANT STREPTOCOCCUS GROUP C  Final   Report Status PENDING  Incomplete    RADIOLOGY:  No results found.   Management plans discussed with the patient, family and they are in agreement.  CODE STATUS:     Code Status Orders        Start     Ordered   02/22/17 1156  Full code  Continuous     02/22/17 1155    Code Status History    Date Active Date Inactive Code Status Order ID Comments User Context   09/11/2015  3:58 AM 09/13/2015  3:19 PM Full Code [id number]  Ivor Costa, MD ED   07/28/2014  8:14 PM 07/29/2014  5:18 PM Full Code [id number]  Justice Britain, PA-C Inpatient   10/04/2013  7:42 PM 10/11/2013  7:00 PM Full Code [id number]  Rexene Alberts, MD ED    Advance Directive Documentation     Most Recent Value  Type of Advance Directive  Healthcare Power of [place], Living will  Pre-existing out of facility DNR order (yellow form or pink MOST form)  -  "MOST" Form in Place?  -      TOTAL TIME TAKING CARE OF THIS PATIENT: 37 minutes.    Nealy Karapetian M.D on 02/25/2017 at 11:17 AM  Between 7am to 6pm - Pager - [phone]  After 6pm go to [email or web address] - Proofreader  [employer]  Hospitalists    Office  [phone]  CC: Primary care physician; Jearld Fenton, NP   Note: This dictation was prepared with Dragon dictation along with smaller phrase technology. Any transcriptional errors that result from this process are unintentional.

## 2017-02-25 NOTE — Progress Notes (Addendum)
A&O x4. VSS. Pt ABX changed to oral. Given before d/c. Went over d/c instructions. Pt understood and signed. D/C'd with family to home.

## 2017-02-26 DIAGNOSIS — I2510 Atherosclerotic heart disease of native coronary artery without angina pectoris: Secondary | ICD-10-CM | POA: Diagnosis not present

## 2017-02-26 DIAGNOSIS — M48061 Spinal stenosis, lumbar region without neurogenic claudication: Secondary | ICD-10-CM | POA: Diagnosis not present

## 2017-02-26 DIAGNOSIS — M797 Fibromyalgia: Secondary | ICD-10-CM | POA: Diagnosis not present

## 2017-02-26 DIAGNOSIS — L03031 Cellulitis of right toe: Secondary | ICD-10-CM | POA: Diagnosis not present

## 2017-02-26 DIAGNOSIS — Z89421 Acquired absence of other right toe(s): Secondary | ICD-10-CM | POA: Diagnosis not present

## 2017-02-26 DIAGNOSIS — L02611 Cutaneous abscess of right foot: Secondary | ICD-10-CM | POA: Diagnosis not present

## 2017-02-26 DIAGNOSIS — E1169 Type 2 diabetes mellitus with other specified complication: Secondary | ICD-10-CM | POA: Diagnosis not present

## 2017-02-26 DIAGNOSIS — M86071 Acute hematogenous osteomyelitis, right ankle and foot: Secondary | ICD-10-CM | POA: Diagnosis not present

## 2017-02-26 DIAGNOSIS — G4733 Obstructive sleep apnea (adult) (pediatric): Secondary | ICD-10-CM | POA: Diagnosis not present

## 2017-02-26 DIAGNOSIS — M5137 Other intervertebral disc degeneration, lumbosacral region: Secondary | ICD-10-CM | POA: Diagnosis not present

## 2017-02-26 DIAGNOSIS — F419 Anxiety disorder, unspecified: Secondary | ICD-10-CM | POA: Diagnosis not present

## 2017-02-26 DIAGNOSIS — K589 Irritable bowel syndrome without diarrhea: Secondary | ICD-10-CM | POA: Diagnosis not present

## 2017-02-26 DIAGNOSIS — Z79891 Long term (current) use of opiate analgesic: Secondary | ICD-10-CM | POA: Diagnosis not present

## 2017-02-26 DIAGNOSIS — Z87891 Personal history of nicotine dependence: Secondary | ICD-10-CM | POA: Diagnosis not present

## 2017-02-26 DIAGNOSIS — F329 Major depressive disorder, single episode, unspecified: Secondary | ICD-10-CM | POA: Diagnosis not present

## 2017-02-26 DIAGNOSIS — Z7982 Long term (current) use of aspirin: Secondary | ICD-10-CM | POA: Diagnosis not present

## 2017-02-26 DIAGNOSIS — E1142 Type 2 diabetes mellitus with diabetic polyneuropathy: Secondary | ICD-10-CM | POA: Diagnosis not present

## 2017-02-28 DIAGNOSIS — M5137 Other intervertebral disc degeneration, lumbosacral region: Secondary | ICD-10-CM | POA: Diagnosis not present

## 2017-02-28 DIAGNOSIS — I2510 Atherosclerotic heart disease of native coronary artery without angina pectoris: Secondary | ICD-10-CM | POA: Diagnosis not present

## 2017-02-28 DIAGNOSIS — L02611 Cutaneous abscess of right foot: Secondary | ICD-10-CM | POA: Diagnosis not present

## 2017-02-28 DIAGNOSIS — L03031 Cellulitis of right toe: Secondary | ICD-10-CM | POA: Diagnosis not present

## 2017-02-28 DIAGNOSIS — E1169 Type 2 diabetes mellitus with other specified complication: Secondary | ICD-10-CM | POA: Diagnosis not present

## 2017-02-28 DIAGNOSIS — M86071 Acute hematogenous osteomyelitis, right ankle and foot: Secondary | ICD-10-CM | POA: Diagnosis not present

## 2017-03-01 DIAGNOSIS — L02611 Cutaneous abscess of right foot: Secondary | ICD-10-CM | POA: Diagnosis not present

## 2017-03-01 DIAGNOSIS — E1169 Type 2 diabetes mellitus with other specified complication: Secondary | ICD-10-CM | POA: Diagnosis not present

## 2017-03-01 DIAGNOSIS — M86071 Acute hematogenous osteomyelitis, right ankle and foot: Secondary | ICD-10-CM | POA: Diagnosis not present

## 2017-03-01 DIAGNOSIS — L03031 Cellulitis of right toe: Secondary | ICD-10-CM | POA: Diagnosis not present

## 2017-03-01 DIAGNOSIS — I2510 Atherosclerotic heart disease of native coronary artery without angina pectoris: Secondary | ICD-10-CM | POA: Diagnosis not present

## 2017-03-01 DIAGNOSIS — M5137 Other intervertebral disc degeneration, lumbosacral region: Secondary | ICD-10-CM | POA: Diagnosis not present

## 2017-03-02 LAB — AEROBIC/ANAEROBIC CULTURE W GRAM STAIN (SURGICAL/DEEP WOUND)

## 2017-03-06 DIAGNOSIS — M86071 Acute hematogenous osteomyelitis, right ankle and foot: Secondary | ICD-10-CM | POA: Diagnosis not present

## 2017-03-06 DIAGNOSIS — L02611 Cutaneous abscess of right foot: Secondary | ICD-10-CM | POA: Diagnosis not present

## 2017-03-06 DIAGNOSIS — L03031 Cellulitis of right toe: Secondary | ICD-10-CM | POA: Diagnosis not present

## 2017-03-06 DIAGNOSIS — E1169 Type 2 diabetes mellitus with other specified complication: Secondary | ICD-10-CM | POA: Diagnosis not present

## 2017-03-06 DIAGNOSIS — M5137 Other intervertebral disc degeneration, lumbosacral region: Secondary | ICD-10-CM | POA: Diagnosis not present

## 2017-03-06 DIAGNOSIS — I2510 Atherosclerotic heart disease of native coronary artery without angina pectoris: Secondary | ICD-10-CM | POA: Diagnosis not present

## 2017-03-12 DIAGNOSIS — M86071 Acute hematogenous osteomyelitis, right ankle and foot: Secondary | ICD-10-CM | POA: Diagnosis not present

## 2017-03-12 DIAGNOSIS — M5137 Other intervertebral disc degeneration, lumbosacral region: Secondary | ICD-10-CM | POA: Diagnosis not present

## 2017-03-12 DIAGNOSIS — I2510 Atherosclerotic heart disease of native coronary artery without angina pectoris: Secondary | ICD-10-CM | POA: Diagnosis not present

## 2017-03-12 DIAGNOSIS — L03031 Cellulitis of right toe: Secondary | ICD-10-CM | POA: Diagnosis not present

## 2017-03-12 DIAGNOSIS — E1169 Type 2 diabetes mellitus with other specified complication: Secondary | ICD-10-CM | POA: Diagnosis not present

## 2017-03-12 DIAGNOSIS — L02611 Cutaneous abscess of right foot: Secondary | ICD-10-CM | POA: Diagnosis not present

## 2017-03-13 DIAGNOSIS — E119 Type 2 diabetes mellitus without complications: Secondary | ICD-10-CM | POA: Diagnosis not present

## 2017-03-18 ENCOUNTER — Ambulatory Visit: Payer: Medicare Other | Admitting: Internal Medicine

## 2017-03-20 DIAGNOSIS — L03031 Cellulitis of right toe: Secondary | ICD-10-CM | POA: Diagnosis not present

## 2017-03-20 DIAGNOSIS — M86071 Acute hematogenous osteomyelitis, right ankle and foot: Secondary | ICD-10-CM | POA: Diagnosis not present

## 2017-03-20 DIAGNOSIS — I2510 Atherosclerotic heart disease of native coronary artery without angina pectoris: Secondary | ICD-10-CM | POA: Diagnosis not present

## 2017-03-20 DIAGNOSIS — E1169 Type 2 diabetes mellitus with other specified complication: Secondary | ICD-10-CM | POA: Diagnosis not present

## 2017-03-20 DIAGNOSIS — M5137 Other intervertebral disc degeneration, lumbosacral region: Secondary | ICD-10-CM | POA: Diagnosis not present

## 2017-03-20 DIAGNOSIS — L02611 Cutaneous abscess of right foot: Secondary | ICD-10-CM | POA: Diagnosis not present

## 2017-03-22 ENCOUNTER — Encounter: Payer: Self-pay | Admitting: Internal Medicine

## 2017-03-22 ENCOUNTER — Ambulatory Visit (INDEPENDENT_AMBULATORY_CARE_PROVIDER_SITE_OTHER): Payer: Medicare Other | Admitting: Internal Medicine

## 2017-03-22 ENCOUNTER — Other Ambulatory Visit: Payer: Self-pay

## 2017-03-22 VITALS — BP 122/82 | HR 69 | Temp 98.5°F | Wt 227.0 lb

## 2017-03-22 DIAGNOSIS — L03119 Cellulitis of unspecified part of limb: Secondary | ICD-10-CM

## 2017-03-22 DIAGNOSIS — E11628 Type 2 diabetes mellitus with other skin complications: Secondary | ICD-10-CM

## 2017-03-22 DIAGNOSIS — Z89421 Acquired absence of other right toe(s): Secondary | ICD-10-CM

## 2017-03-22 DIAGNOSIS — I6523 Occlusion and stenosis of bilateral carotid arteries: Secondary | ICD-10-CM

## 2017-03-22 DIAGNOSIS — IMO0002 Reserved for concepts with insufficient information to code with codable children: Secondary | ICD-10-CM

## 2017-03-22 MED ORDER — PAIN MANAGEMENT IT PUMP REFILL: 1.0000 | Freq: Once | INTRATHECAL | 0 refills | Status: DC

## 2017-03-22 NOTE — Progress Notes (Signed)
Subjective:    Patient ID: Melinda [name], female    DOB: 1956-04-08, 61 y.o.   MRN: [id number]  HPI  Pt presents to the clinic today for Hospital Follow Up. She was sent to the ER 8/24 by podiatry. 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 MRI of the foot showed and abscess but no osteomyelitis. She had an I&D performed. Wound was cultured, grew out Group C Streptococcus. She was started on oral Vancomycin, but eventually switched to oral Augmentin prior to discharge. Since discharge, she reports that she has finished all antibiotics as prescribed. She denies redness, warmth or drainage from the site of the amputation. She has a follow up with podiatry on Monday.  Review of Systems      Past Medical History:  Diagnosis Date  . Anemia    years ago  . Anxiety   . Blue toes    2nd toe on right foot, will get appt.  . Bulging disc   . CAD (coronary artery disease) 2009   s/p stent to LAD  . 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 Chicken pox   . Coronary arteritis   . Degenerative disc disease   . Depression   . Diabetes mellitus without complication ([place])   . Facet joint disease ([place])   . Fibromyalgia   . Heart disease   . Hyperlipidemia   . IBS (irritable bowel syndrome)   . MCL deficiency, knee   . MRSA (methicillin resistant staph aureus) culture positive 2011   GREAT TOE RIGHT FOOT  . Neuropathy 04/18/2010  . Peripheral neuropathy   . Sleep apnea    uses CPAP, sleep study at [facility]  . Spinal stenosis     Current Outpatient Prescriptions  Medication Sig Dispense Refill  . ACCU-CHEK SOFTCLIX LANCETS lancets Use to check blood sugar 2 times daily.  Dx: E11.42 100 each 2  . acetaminophen (TYLENOL) 500 MG tablet Take 1,000 mg by mouth every 6 (six) hours as needed (for pain/headache.).    Marland Kitchen AMBULATORY NON FORMULARY MEDICATION Medication Name: CPAP MASK OF CHOICE FOR HOME  DEVICE 1 each 0  . aspirin 81 MG tablet Take 81 mg by mouth daily.    Marland Kitchen buPROPion (WELLBUTRIN XL) 150 MG 24 hr tablet TAKE 1 TABLET BY MOUTH DAILY 90 tablet 0  . Cholecalciferol (VITAMIN D3) 2000 units TABS Take 2,000 Units by mouth daily.    . Chromium Picolinate 800 MCG TABS Take 800 mcg by mouth daily.    Wallace Cullens POWD Take 1 capsule by mouth daily.    . Coenzyme Q10 (CO Q 10) 100 MG CAPS Take 200 mg by mouth daily.     Marland Kitchen glucosamine-chondroitin 500-400 MG tablet Take 1 tablet by mouth daily.     Marland Kitchen glucose blood (ACCU-CHEK AVIVA PLUS) test strip TEST 2 TIMES DAY  **E11.9** 200 each 1  . glucose blood test strip 1 each by Other route. Use as instructed    . GRAPE SEED EXTRACT PO Take 1 capsule by mouth daily.    Marland Kitchen ibuprofen (ADVIL,MOTRIN) 200 MG tablet Take 600 mg by mouth every 8 (eight) hours as needed (for pain.).    Marland Kitchen MAGNESIUM MALATE PO Take 115 mg by mouth daily.    . meloxicam (MOBIC) 15 MG tablet Take 1 tablet (15 mg total) by mouth daily. 90 tablet 0  . Multiple Vitamin (MULTIVITAMIN) tablet Take  1 tablet by mouth daily.    . nitroGLYCERIN (NITROSTAT) 0.4 MG SL tablet Place 1 tablet (0.4 mg total) under the tongue every 5 (five) minutes as needed for chest pain. 25 tablet prn  . Omega-3 Fatty Acids (FISH OIL) 1200 MG CAPS Take 1,200 mg by mouth daily.     Marland Kitchen oxyCODONE (ROXICODONE) 5 MG immediate release tablet Take 1 tablet (5 mg total) by mouth every 4 (four) hours as needed for severe pain. 30 tablet 0  . PAIN MANAGEMENT IT PUMP REFILL 1 each by Intrathecal route once. Medication: PF Fentanyl 500.0 mcg/ml PF Baclofen 300.23mcg/ml PF Bupivicaine 20.0 mg/ml Total Volume: 40 ml Needed by 12-11-16 @1000  (Patient taking differently: 1 each by Intrathecal route once. Medication: PF Fentanyl 147.95 mcg/24 hr PF Baclofen 88.18mcg/24hr PF Bupivicaine 5.918 mg/24 hr) 1 each 0  . PARoxetine (PAXIL) 20 MG tablet TAKE ONE-HALF OF A TABLET BY MOUTH TWICEDAILY (Patient taking  differently: TAKE ONE-HALF OF A TABLET BY MOUTH DAILY IN THE MORNING) 90 tablet 2  . polyethylene glycol (MIRALAX / GLYCOLAX) packet Take 17 g by mouth daily as needed (for constipation.).     Marland Kitchen pregabalin (LYRICA) 100 MG capsule Take 1 capsule (100 mg total) by mouth 3 (three) times daily. 270 capsule 0  . rosuvastatin (CRESTOR) 20 MG tablet Take 1 tablet (20 mg total) by mouth daily. 90 tablet 3  . tiZANidine (ZANAFLEX) 4 MG capsule Take 1 capsule (4 mg total) by mouth 3 (three) times daily. 270 capsule 0  . vitamin E 400 UNIT capsule Take 400 Units by mouth daily.     No current facility-administered medications for this visit.     No Known Allergies  Family History  Problem Relation Age of Onset  . Lung cancer Mother   . Diabetes Father   . Heart disease Maternal Grandfather   . Diabetes Paternal Grandmother   . Colon cancer Paternal Grandfather   . Stroke Neg Hx     Social History   Social History  . Marital status: Significant Other    Spouse name: N/A  . Number of children: N/A  . Years of education: N/A   Occupational History  . Not on file.   Social History Main Topics  . Smoking status: Former Smoker    Packs/day: 1.50    Years: 32.00    Types: Cigarettes    Quit date: 07/22/2007  . Smokeless tobacco: Never Used  . Alcohol use 0.0 oz/week     Comment: occ  . Drug use: No  . Sexual activity: Yes   Other Topics Concern  . Not on file   Social History Narrative   Lives at home with a partner. Independent at baseline     Constitutional: Denies fever, malaise, fatigue, headache or abrupt weight changes.  Skin: Pt reports open wound of right foot.    No other specific complaints in a complete review of systems (except as listed in HPI above).  Objective:   Physical Exam  BP 122/82   Pulse 69   Temp 98.5 F (36.9 C) (Oral)   Wt 227 lb (103 kg)   SpO2 96%   BMI 32.57 kg/m  Wt Readings from Last 3 Encounters:  03/22/17 227 lb (103 kg)  02/22/17  230 lb (104.3 kg)  02/01/17 230 lb (104.3 kg)    General: Appears her stated age, well developed, well nourished in NAD. Skin: Wound not visualized, she just had this dressed and wrapped with before she came  here.  BMET    Component Value Date/Time   NA 141 02/23/2017 0321   NA 135 (L) 06/30/2013 1203   K 4.5 02/23/2017 0321   K 4.1 06/30/2013 1203   CL 108 02/23/2017 0321   CL 102 06/30/2013 1203   CO2 28 02/23/2017 0321   CO2 32 06/30/2013 1203   GLUCOSE 147 (H) 02/23/2017 0321   GLUCOSE 143 (H) 06/30/2013 1203   BUN 24 (H) 02/23/2017 0321   BUN 19 (H) 06/30/2013 1203   CREATININE 0.97 02/23/2017 0321   CREATININE 0.89 06/30/2013 1203   CALCIUM 8.4 (L) 02/23/2017 0321   CALCIUM 9.7 06/30/2013 1203   GFRNONAA >60 02/23/2017 0321   GFRNONAA >60 06/30/2013 1203   GFRAA >60 02/23/2017 0321   GFRAA >60 06/30/2013 1203    Lipid Panel     Component Value Date/Time   CHOL 133 05/18/2016 1526   TRIG 205.0 (H) 05/18/2016 1526   HDL 37.20 (L) 05/18/2016 1526   CHOLHDL 4 05/18/2016 1526   VLDL 41.0 (H) 05/18/2016 1526   LDLCALC 62 09/11/2015 0421    CBC    Component Value Date/Time   WBC 5.9 02/23/2017 0321   RBC 3.84 02/23/2017 0321   HGB 11.3 (L) 02/23/2017 0321   HGB 14.0 11/12/2011 1136   HCT 32.7 (L) 02/23/2017 0321   HCT 40.7 11/12/2011 1136   PLT 157 02/23/2017 0321   PLT 274 11/12/2011 1136   MCV 85.3 02/23/2017 0321   MCV 86 11/12/2011 1136   MCH 29.4 02/23/2017 0321   MCHC 34.4 02/23/2017 0321   RDW 13.2 02/23/2017 0321   RDW 12.8 11/12/2011 1136   LYMPHSABS 1.6 01/31/2017 1236   LYMPHSABS 2.5 11/12/2011 1136   MONOABS 0.5 01/31/2017 1236   MONOABS 0.8 11/12/2011 1136   EOSABS 0.2 01/31/2017 1236   EOSABS 0.6 11/12/2011 1136   BASOSABS 0.0 01/31/2017 1236   BASOSABS 0.1 11/12/2011 1136    Hgb A1C Lab Results  Component Value Date   HGBA1C 5.8 (H) 02/22/2017            Assessment & Plan:   Hospital Follow up for Cellulitis of Right  Foot:  Hospital notes, labs and imaging reviewed Wound is wrapped up, no longer on antibiotics Follow up with podiatry Monday  Return precautions discussed [name], NP

## 2017-03-22 NOTE — Patient Instructions (Signed)
Living With an Amputation  The most common causes of amputation from the hip down include:   Diseases that:  ? Reduce the blood flow to an area of your body.  ? Decrease your body's ability to fight infection.   Traumatic injuries that cause significant damage to body tissues.   Birth defects.   Cancerous lumps (malignant tumors).  Amputation above the hip is usually the result of trauma or birth defect. Disease is a less common cause.  Living with an amputation can be challenging, but you can still live a long, productive life.  WHAT ARE THE COMMON CHALLENGES OF LIVING WITH AN AMPUTATION?  The most common challenges are mobility and self-care. With some new habits, though, it is often possible to do all of the activities you used to do.  You may be able to use a device that substitutes for your limb (prosthesis). The prosthesis helps you adapt more quickly to these challenges. Your health care provider can help select a prosthesis to meet your needs. A person who helps you choose and fits you with a prosthesis (prosthetist) may also help.  A rehabilitation program can also help you gain mobility and self-reliance. Your rehabilitation team may include:   Physicians.   Physical and occupational therapists.   Prosthetists.   Nurses.   Social workers.   Psychologists.   Dietitians.  Your rehabilitation team will help you with all aspects of recovery and returning to work, home, sports, and your community. You will learn to:   Get around safely.   Adjust your home.   Exercise.   Use a prosthetic.   Work through emotional challenges.   Connect with other people who have gone through the same experience.  Additional challenges may include:   Grieving period.   Body image issues.   Lifestyle issues, such as sex.   Maintaining a healthy weight.  These issues are normal. Discuss these with your rehabilitation team.  WHEN CAN I RETURN TO MY REGULAR ACTIVITIES?   Returning to your normal activities is part  of healing. Changes can often be made to equipment that allow you to return to a sport or hobby. Some companies design special equipment for this. Discuss all of your leisure interests with your health care provider and prosthetist.  WHEN CAN I RETURN TO WORK?  When you are ready to return to work, your therapists can perform job site evaluations and make recommendations to help you perform your job. You may not be able to return to your same job. Your local Office of Vocational Rehabilitation can assist you in job retraining.  FOR MORE INFORMATION:  Visit these online resources. You can find tips on everything from getting dressed and using bathrooms to driving and travel considerations. These resources can also connect you to a network of emotional support, activities, and innovations. You may also search the Internet to find a local support group.   Amputee Coalition: http://www.amputee-coalition.org/ensuring-fall-safety/   Amputee Support Groups: http://amputee.supportgroups.com   Daily Strength: http://www.dailystrength.org/c/Amputees/support-group   National Amputee Foundation: http://www.nationalamputation.org   National Center on Health, Physical Activity and Disability: http://www.nchpad.org   Disabled Sports USA: http://www.disabledsportsusa.org   American Academy of Orthotists and Prosthetists: http://www.oandp.org  This information is not intended to replace advice given to you by your health care provider. Make sure you discuss any questions you have with your health care provider.  Document Released: 03/10/2002 Document Revised: 07/09/2014 Document Reviewed: 11/03/2013  Elsevier Interactive Patient Education  2017 Elsevier Inc.

## 2017-03-25 DIAGNOSIS — M76821 Posterior tibial tendinitis, right leg: Secondary | ICD-10-CM | POA: Diagnosis not present

## 2017-03-25 DIAGNOSIS — L97513 Non-pressure chronic ulcer of other part of right foot with necrosis of muscle: Secondary | ICD-10-CM | POA: Diagnosis not present

## 2017-03-25 DIAGNOSIS — M21541 Acquired clubfoot, right foot: Secondary | ICD-10-CM | POA: Diagnosis not present

## 2017-03-26 DIAGNOSIS — I2510 Atherosclerotic heart disease of native coronary artery without angina pectoris: Secondary | ICD-10-CM | POA: Diagnosis not present

## 2017-03-26 DIAGNOSIS — E1169 Type 2 diabetes mellitus with other specified complication: Secondary | ICD-10-CM | POA: Diagnosis not present

## 2017-03-26 DIAGNOSIS — M5137 Other intervertebral disc degeneration, lumbosacral region: Secondary | ICD-10-CM | POA: Diagnosis not present

## 2017-03-26 DIAGNOSIS — M86071 Acute hematogenous osteomyelitis, right ankle and foot: Secondary | ICD-10-CM | POA: Diagnosis not present

## 2017-03-26 DIAGNOSIS — L03031 Cellulitis of right toe: Secondary | ICD-10-CM | POA: Diagnosis not present

## 2017-03-26 DIAGNOSIS — L02611 Cutaneous abscess of right foot: Secondary | ICD-10-CM | POA: Diagnosis not present

## 2017-04-01 ENCOUNTER — Ambulatory Visit: Payer: Medicare Other | Admitting: Pain Medicine

## 2017-04-02 ENCOUNTER — Encounter: Payer: Self-pay | Admitting: Nurse Practitioner

## 2017-04-02 ENCOUNTER — Ambulatory Visit: Payer: Medicare Other | Attending: Pain Medicine | Admitting: Nurse Practitioner

## 2017-04-02 VITALS — BP 117/68 | HR 76 | Temp 98.2°F | Resp 16 | Ht 70.0 in | Wt 221.0 lb

## 2017-04-02 DIAGNOSIS — Z79891 Long term (current) use of opiate analgesic: Secondary | ICD-10-CM | POA: Insufficient documentation

## 2017-04-02 DIAGNOSIS — M8949 Other hypertrophic osteoarthropathy, multiple sites: Secondary | ICD-10-CM

## 2017-04-02 DIAGNOSIS — G8929 Other chronic pain: Secondary | ICD-10-CM

## 2017-04-02 DIAGNOSIS — M797 Fibromyalgia: Secondary | ICD-10-CM | POA: Diagnosis not present

## 2017-04-02 DIAGNOSIS — M25561 Pain in right knee: Secondary | ICD-10-CM | POA: Diagnosis not present

## 2017-04-02 DIAGNOSIS — G894 Chronic pain syndrome: Secondary | ICD-10-CM | POA: Insufficient documentation

## 2017-04-02 DIAGNOSIS — M150 Primary generalized (osteo)arthritis: Secondary | ICD-10-CM

## 2017-04-02 DIAGNOSIS — M5441 Lumbago with sciatica, right side: Secondary | ICD-10-CM

## 2017-04-02 DIAGNOSIS — M1991 Primary osteoarthritis, unspecified site: Secondary | ICD-10-CM | POA: Insufficient documentation

## 2017-04-02 DIAGNOSIS — M545 Low back pain: Secondary | ICD-10-CM | POA: Insufficient documentation

## 2017-04-02 DIAGNOSIS — M159 Polyosteoarthritis, unspecified: Secondary | ICD-10-CM

## 2017-04-02 DIAGNOSIS — M7918 Myalgia, other site: Secondary | ICD-10-CM

## 2017-04-02 MED ORDER — NALOXONE HCL 2 MG/2ML IJ SOSY: 1.0000 mg | PREFILLED_SYRINGE | INTRAMUSCULAR | 1 refills | Status: DC | PRN

## 2017-04-02 NOTE — Progress Notes (Signed)
Nursing Pain Medication Assessment:  Safety precautions to be maintained throughout the outpatient stay will include: orient to surroundings, keep bed in low position, maintain call bell within reach at all times, provide assistance with transfer out of bed and ambulation.  Medication Inspection Compliance: Pill count conducted under aseptic conditions, in front of the patient. Neither the pills nor the bottle was removed from the patient's sight at any time. Once count was completed pills were immediately returned to the patient in their original bottle.  Medication: See above Pill/Patch Count: 18 of 90 pills remain Pill/Patch Appearance: Markings consistent with prescribed medication Bottle Appearance: Standard pharmacy container. Clearly labeled. Filled Date: 8 / 7 / 2018 Last Medication intake:  Today

## 2017-04-02 NOTE — Patient Instructions (Addendum)
____________________________________________________________________________________________  Medication Rules  Applies to: All patients receiving prescriptions (written or electronic).  Pharmacy of record: Pharmacy where electronic prescriptions will be sent. If written prescriptions are taken to a different pharmacy, please inform the nursing staff. The pharmacy listed in the electronic medical record should be the one where you would like electronic prescriptions to be sent.  Prescription refills: Only during scheduled appointments. Applies to both, written and electronic prescriptions.  NOTE: The following applies primarily to controlled substances (Opioid* Pain Medications).   Patient's responsibilities: 1. Pain Pills: Bring all pain pills to every appointment (except for procedure appointments). 2. Pill Bottles: Bring pills in original pharmacy bottle. Always bring newest bottle. Bring bottle, even if empty. 3. Medication refills: You are responsible for knowing and keeping track of what medications you need refilled. The day before your appointment, write a list of all prescriptions that need to be refilled. Bring that list to your appointment and give it to the admitting nurse. Prescriptions will be written only during appointments. If you forget a medication, it will not be "Called in", "Faxed", or "electronically sent". You will need to get another appointment to get these prescribed. 4. Prescription Accuracy: You are responsible for carefully inspecting your prescriptions before leaving our office. Have the discharge nurse carefully go over each prescription with you, before taking them home. Make sure that your name is accurately spelled, that your address is correct. Check the name and dose of your medication to make sure it is accurate. Check the number of pills, and the written instructions to make sure they are clear and accurate. Make sure that you are given enough medication to  last until your next medication refill appointment. 5. Taking Medication: Take medication as prescribed. Never take more pills than instructed. Never take medication more frequently than prescribed. Taking less pills or less frequently is permitted and encouraged, when it comes to controlled substances (written prescriptions).  6. Inform other Doctors: Always inform, all of your healthcare providers, of all the medications you take. 7. Pain Medication from other Providers: You are not allowed to accept any additional pain medication from any other Doctor or Healthcare provider. There are two exceptions to this rule. (see below) In the event that you require additional pain medication, you are responsible for notifying us, as stated below. 8. Medication Agreement: You are responsible for carefully reading and following our Medication Agreement. This must be signed before receiving any prescriptions from our practice. Safely store a copy of your signed Agreement. Violations to the Agreement will result in no further prescriptions. (Additional copies of our Medication Agreement are available upon request.) 9. Laws, Rules, & Regulations: All patients are expected to follow all Federal and State Laws, Statutes, Rules, & Regulations. Ignorance of the Laws does not constitute a valid excuse. The use of any illegal substances is prohibited. 10. Adopted CDC guidelines & recommendations: Target dosing levels will be at or below 60 MME/day. Use of benzodiazepines** is not recommended.  Exceptions: There are only two exceptions to the rule of not receiving pain medications from other Healthcare Providers. 1. Exception #1 (Emergencies): In the event of an emergency (i.e.: accident requiring emergency care), you are allowed to receive additional pain medication. However, you are responsible for: As soon as you are able, call our office [phone], at any time of the day or night, and leave a message stating your  name, the date and nature of the emergency, and the name and dose of the medication   prescribed. In the event that your call is answered by a member of our staff, make sure to document and save the date, time, and the name of the person that took your information.  2. Exception #2 (Planned Surgery): In the event that you are scheduled by another doctor or dentist to have any type of surgery or procedure, you are allowed (for a period no longer than 30 days), to receive additional pain medication, for the acute post-op pain. However, in this case, you are responsible for picking up a copy of our "Post-op Pain Management for Surgeons" handout, and giving it to your surgeon or dentist. This document is available at our office, and does not require an appointment to obtain it. Simply go to our office during business hours (Monday-Thursday from 8:00 AM to 4:00 PM) (Friday 8:00 AM to 12:00 Noon) or if you have a scheduled appointment with [place], prior to your surgery, and ask for it by name. In addition, you will need to provide [place] with your name, name of your surgeon, type of surgery, and date of procedure or surgery.  *Opioid medications include: morphine, codeine, oxycodone, oxymorphone, hydrocodone, hydromorphone, meperidine, tramadol, tapentadol, buprenorphine, fentanyl, methadone. **Benzodiazepine medications include: diazepam (Valium), alprazolam (Xanax), clonazepam (Klonopine), lorazepam (Ativan), clorazepate (Tranxene), chlordiazepoxide (Librium), estazolam (Prosom), oxazepam (Serax), temazepam (Restoril), triazolam (Halcion)  ____________________________________________________________________________________________  Opioid Overdose Opioids are substances that relieve pain by binding to pain receptors in your brain and spinal cord. Opioids include illegal drugs, such as heroin, as well as prescription pain medicines.An opioid overdose happens when you take too much of an opioid substance. This can happen  with any type of opioid, including:  Heroin.  Morphine.  Codeine.  Methadone.  Oxycodone.  Hydrocodone.  Fentanyl.  Hydromorphone.  Buprenorphine.  The effects of an overdose can be mild, dangerous, or even deadly. Opioid overdose is a medical emergency. What are the causes? This condition may be caused by:  Taking too much of an opioid by accident.  Taking too much of an opioid on purpose.  An error made by a health care provider who prescribes a medicine.  An error made by the pharmacist who fills the prescription order.  Using more than one substance that contains opioids at the same time.  Mixing an opioid with a substance that affects your heart, breathing, or blood pressure. These include alcohol, tranquilizers, sleeping pills, illegal drugs, and some over-the-counter medicines.  What increases the risk? This condition is more likely in:  Children. They may be attracted to colorful pills. Because of a child's small size, even a small amount of a drug can be dangerous.  Elderly people. They may be taking many different drugs. Elderly people may have difficulty reading labels or remembering when they last took their medicine.  People who take an opioid on a long-term basis.  People who use: ? Illegal drugs. ? Other substances, including alcohol, while using an opioid.  People who have: ? A history of drug or alcohol abuse. ? Certain mental health conditions.  People who take opioids that are not prescribed for them.  What are the signs or symptoms? Symptoms of this condition depend on the type of opioid and the amount that was taken. Common symptoms include:  Sleepiness or difficulty waking from sleep.  Confusion.  Slurred speech.  Slowed breathing and a slow pulse.  Nausea and vomiting.  Abnormally small pupils.  Signs and symptoms that require emergency treatment include:  Cold, clammy, and pale skin.  Blue  lips and  fingernails.  Vomiting.  Gurgling sounds in the throat.  A pulse that is very slow or difficult to detect.  Breathing that is very slow, noisy, or difficult to detect.  Limp body.  Inability to respond to speech or be awakened from sleep (stupor).  How is this diagnosed? This condition is diagnosed based on your symptoms. It is important to tell your health care provider:  All of the opioidsthat you took.  When you took the opioids.  Whether you were drinking alcohol or using other substances.  Your health care provider will do a physical exam. This exam may include:  Checking and monitoring your heart rate and rhythm, your breathing rate and depth, your temperature, and your blood pressure (vital signs).  Checking for abnormally small pupils.  Measuring oxygen levels in your blood.  You may also have blood tests or urine tests. How is this treated? Supporting your vital signs and your breathing is the first step in treating an opioid overdose. Treatment may also include:  Giving fluids and minerals (electrolytes) through an IV tube.  Inserting a breathing tube (endotracheal tube) in your airway to help you breathe.  Giving oxygen.  Passing a tube through your nose and into your stomach (NG tube, or nasogastric tube) to wash out your stomach.  Giving medicines that: ? Increase your blood pressure. ? Absorb any opioid that is in your digestive system. ? Reverse the effects of the opioid (naloxone).  Ongoing counseling and mental health support if you intentionally overdosed or used an illegal drug.  Follow these instructions at home:  Take over-the-counter and prescription medicines only as told by your health care provider. Always ask your health care provider about possible side effects and interactions of any new medicine that you start taking.  Keep a list of all of the medicines that you take, including over-the-counter medicines. Bring this list with you to  all of your medical visits.  Drink enough fluid to keep your urine clear or pale yellow.  Keep all follow-up visits as told by your health care provider. This is important. How is this prevented?  Get help if you are struggling with: ? Alcohol or drug use. ? Depression or another mental health problem.  Keep the phone number of your local poison control center near your phone or on your cell phone.  Store all medicines in safety containers that are out of the reach of children.  Read the drug inserts that come with your medicines.  Do not drink alcohol when taking opioids.  Do not use illegal drugs.  Do not take opioid medicines that are not prescribed for you. Contact a health care provider if:  Your symptoms return.  You develop new symptoms or side effects when you are taking medicines. Get help right away if:  You think that you or someone else may have taken too much of an opioid. The hotline of the [facility] is (845) 830-5537.  You or someone else is having symptoms of an opioid overdose.  You have serious thoughts about hurting yourself or others.  You have: ? Chest pain. ? Difficulty breathing. ? A loss of consciousness. Opioid overdose is an emergency. Do not wait to see if the symptoms will go away. Get medical help right away. Call your local emergency services (911 in the [place] Do not drive yourself to the hospital. This information is not intended to replace advice given to you by your health care provider.  Make sure you discuss any questions you have with your health care provider. Document Released: 07/26/2004 Document Revised: 11/24/2015 Document Reviewed: 12/02/2014 Elsevier Interactive Patient Education  [employer].

## 2017-04-02 NOTE — Progress Notes (Signed)
Patient's Name: Melinda [name]  MRN: [id number]  Referring Provider: Jearld Fenton, NP  DOB: June 15, 1956  PCP: Jearld Fenton, NP  DOS: 04/02/2017  Note by: Dionisio David, NP  Service setting: Ambulatory outpatient  Specialty: Interventional Pain Management  Patient type: Established  Location: [facility] (AMB) Pain Management Facility  Visit type: Interventional Procedure   Primary Reason for Visit: Interventional Pain Management Treatment. CC: Back Pain (lower) and Knee Pain (right)  Procedure:  Intrathecal Drug Delivery System (IDDS):  Type: Reservoir Refill [phone])       Region: Abdominal Laterality:          Type of Pump: Medtronic Synchromed II (MRI-compatible) Delivery Route: Intrathecal Type of Pain Treated: Neuropathic/Nociceptive Primary Medication Class: Opioid/opiate  Medication, Concentration, Infusion Program, & Delivery Rate: Please see scanned programming printout.  Indications: 1. Chronic low back pain (Bilateral) (L>R)   2. Chronic knee pain (Location of Primary Source of Pain) (Right)   3. Chronic pain syndrome   4. Musculoskeletal pain   5. Fibromyalgia   6. Primary osteoarthritis involving multiple joints   7. Long term current use of opiate analgesic    Pain Assessment: Self-Reported Pain Score: 3 /10             Reported level is compatible with observation.        Intrathecal Pump Therapy Assessment  Manufacturer: Medtronic Synchromed II Type: Programmable Volume: 40 mL reservoir MRI compatibility: Yes   Drug content:  Primary Medication Class: Opioid Primary Medication: PF-Fentanyl Secondary Medication: see pump readout Other Medication: see pump readout   Programming:  Type: Simple continuous. See pump readout for details.   Changes:  Medication Change: None at this point Rate Change: No change in rate  Reported side-effects or adverse reactions: None reported  Effectiveness: Described as relatively effective, allowing for increase in activities  of daily living (ADL) Clinically meaningful improvement in function (CMIF): Sustained CMIF goals met  Plan: Pump refill today  Pre-op Assessment:  Melinda [name] is a 61 y.o. (year old), female patient, seen today for interventional treatment. 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 Melinda [name] has a current medication list which includes the following prescription(s): accu-chek softclix lancets, acetaminophen, AMBULATORY NON FORMULARY MEDICATION, aspirin, bupropion, vitamin d3, chromium picolinate, cinnamon bark, co q 10, glucosamine-chondroitin, glucose blood, glucose blood, grape seed, ibuprofen, magnesium malate, meloxicam, multivitamin, nitroglycerin, fish oil, oxycodone, paroxetine, polyethylene glycol, pregabalin, rosuvastatin, tizanidine, vitamin e, naloxone, and PAIN MANAGEMENT IT PUMP REFILL. Her primarily concern today is the Back Pain (lower) and Knee Pain (right)  Initial Vital Signs: Blood pressure 117/68, pulse 76, temperature 98.2 F (36.8 C), resp. rate 16, height 5' 10" (1.778 m), weight 221 lb (100.2 kg), SpO2 99 %. BMI: Estimated body mass index is 31.71 kg/m as calculated from the following:   Height as of this encounter: 5' 10" (1.778 m).   Weight as of this encounter: 221 lb (100.2 kg).  Risk Assessment: Allergies: Reviewed. She has No Known Allergies.  Allergy Precautions: None required Coagulopathies: Reviewed. None identified.  Blood-thinner therapy: None at this time Active Infection(s):  Reviewed. None identified. Melinda [name] is afebrile  Site Confirmation: Melinda [name] was asked to confirm the procedure and laterality before marking the site Procedure checklist: Completed Consent: Before the procedure and under the influence of no sedative(s), amnesic(s), or anxiolytics, the patient was informed of the treatment options, risks and possible complications. To fulfill our ethical and legal obligations, as recommended by the American Medical Association's Code of Ethics, I have informed the patient of my clinical impression; the nature and purpose of the treatment or procedure; the risks, benefits, and possible complications of the intervention; the alternatives, including doing nothing; the risk(s) and benefit(s) of the alternative treatment(s) or procedure(s); and the risk(s) and benefit(s) of doing nothing.  Melinda [name] was provided with information about the general risks and possible complications associated with most interventional procedures. These include, but are not limited to: failure to achieve desired goals, infection, bleeding, organ or nerve damage, allergic reactions, paralysis, and/or death.  In addition, she was informed of those risks and possible complications associated to this particular procedure, which include, but are not limited to: damage to the implant; failure to decrease pain; local, systemic, or serious CNS infections, intraspinal abscess with possible cord compression and paralysis, or life-threatening such as meningitis; bleeding; organ damage; nerve injury or damage with subsequent sensory, motor, and/or autonomic system dysfunction, resulting in transient or permanent pain, numbness, and/or weakness of one or several areas of the body; allergic reactions, either minor or major life-threatening, such as anaphylactic or anaphylactoid reactions.  Furthermore, Melinda [name] was informed of those risks and complications associated with the medications. These  include, but are not limited to: allergic reactions (i.e.: anaphylactic or anaphylactoid reactions); endorphine suppression; bradycardia and/or hypotension; water retention and/or peripheral vascular relaxation leading to lower extremity edema and possible stasis ulcers; respiratory depression and/or shortness of breath; decreased metabolic rate leading to weight gain; swelling or edema; medication-induced neural toxicity; particulate matter embolism and blood vessel occlusion with resultant organ, and/or nervous system infarction; and/or intrathecal granuloma formation with possible spinal cord compression and permanent paralysis.  Before refilling the pump Melinda [name] was informed that some of the medications used in the devise may not be FDA approved for such use and therefore it constitutes an off-label use of the medications.  Finally, she was informed that Medicine is not an exact science; therefore, there is also the possibility of unforeseen or unpredictable risks and/or possible complications that may result in a catastrophic outcome. The patient indicated having understood very clearly. We have given the patient no guarantees and we have made no promises. Enough time was given to the patient to ask questions, all of which were answered to the patient's satisfaction. Melinda [name] has indicated that she wanted to continue with the procedure. Attestation: I, the ordering provider, attest that I have discussed with the patient the benefits, risks, side-effects, alternatives, likelihood of achieving goals, and potential problems during recovery for the procedure that I have provided informed consent. Date: 04/02/2017; Time: 12:01 PM  Pre-Procedure Preparation:  Monitoring: As per clinic protocol. Respiration, ETCO2, SpO2, BP, heart rate and rhythm monitor placed and checked for adequate function Safety Precautions: Patient was assessed for positional comfort and pressure points before starting the  procedure. Time-out: I initiated and conducted the "Time-out" before starting the procedure, as per protocol. The patient was asked to participate by confirming the accuracy of the "Time Out" information. Verification of the correct person, site, and procedure were performed and confirmed by me, the nursing staff, and the patient. "Time-out" conducted as per Joint  Commission's Universal Protocol (UP.01.01.01). "Time-out" Date & Time: 04/02/2017; 1215 hrs.  Description of Procedure Process:   Position: Supine Target Area: Central-port of intrathecal pump. Approach: Anterior, 90 degree angle approach. Area Prepped: Entire Area around the pump implant. Prepping solution: ChloraPrep (2% chlorhexidine gluconate and 70% isopropyl alcohol) Safety Precautions: Aspiration looking for blood return was conducted prior to all injections. At no point did we inject any substances, as a needle was being advanced. No attempts were made at seeking any paresthesias. Safe injection practices and needle disposal techniques used. Medications properly checked for expiration dates. SDV (single dose vial) medications used. Description of the Procedure: Protocol guidelines were followed. Two nurses trained to do implant refills were present during the entire procedure. The refill medication was checked by both healthcare providers as well as the patient. The patient was included in the "Time-out" to verify the medication. The patient was placed in position. The pump was identified. The area was prepped in the usual manner. The sterile template was positioned over the pump, making sure the side-port location matched that of the pump. Both, the pump and the template were held for stability. The needle provided in the Medtronic Kit was then introduced thru the center of the template and into the central port. The pump content was aspirated and discarded volume documented. The new medication was slowly infused into the pump, thru the  filter, making sure to avoid overpressure of the device. The needle was then removed and the area cleansed, making sure to leave some of the prepping solution back to take advantage of its long term bactericidal properties. The pump was interrogated and programmed to reflect the correct medication, volume, and dosage. The program was printed and taken to the physician for approval. Once checked and signed by the physician, a copy was provided to the patient and another scanned into the EMR. Vitals:   04/02/17 1144  BP: 117/68  Pulse: 76  Resp: 16  Temp: 98.2 F (36.8 C)  SpO2: 99%  Weight: 221 lb (100.2 kg)  Height: 5' 10" (1.778 m)    Start Time: 1220 hrs. End Time: 1245 hrs. Materials & Medications: Medtronic Refill Kit Medication(s): Please see chart orders for details.  Imaging Guidance:  Type of Imaging Technique: None used Indication(s): N/A Exposure Time: No patient exposure Contrast: None used. Fluoroscopic Guidance: N/A Ultrasound Guidance: N/A Interpretation: N/A  Antibiotic Prophylaxis:  Indication(s): None identified Antibiotic given: None  Post-operative Assessment:  EBL: None Complications: No immediate post-treatment complications observed by team, or reported by patient. Note: The patient tolerated the entire procedure well. A repeat set of vitals were taken after the procedure and the patient was kept under observation following institutional policy, for this type of procedure. Post-procedural neurological assessment was performed, showing return to baseline, prior to discharge. The patient was provided with post-procedure discharge instructions, including a section on how to identify potential problems. Should any problems arise concerning this procedure, the patient was given instructions to immediately contact us, at any time, without hesitation. In any case, we plan to contact the patient by telephone for a follow-up status report regarding this interventional  procedure. Comments:  No additional relevant information.  Controlled Substance Pharmacotherapy Assessment REMS (Risk Evaluation and Mitigation Strategy)  Analgesic:Oxycodone IR 5 mg every 8 hours (15 mg/day), in addition to the intrathecal pump medication. MME/day:22 mg/day of oral medication. Ignatius Specking, RN  04/02/2017 11:53 AM  Sign at close encounter Nursing Pain Medication Assessment:  Safety precautions to  be maintained throughout the outpatient stay will include: orient to surroundings, keep bed in low position, maintain call bell within reach at all times, provide assistance with transfer out of bed and ambulation.  Medication Inspection Compliance: Pill count conducted under aseptic conditions, in front of the patient. Neither the pills nor the bottle was removed from the patient's sight at any time. Once count was completed pills were immediately returned to the patient in their original bottle.  Medication: See above Pill/Patch Count: 18 of 90 pills remain Pill/Patch Appearance: Markings consistent with prescribed medication Bottle Appearance: Standard pharmacy container. Clearly labeled. Filled Date: 8 / 4 / 2018 Last Medication intake:  Today   Pharmacokinetics: Liberation and absorption (onset of action): WNL Distribution (time to peak effect): WNL Metabolism and excretion (duration of action): WNL         Pharmacodynamics: Desired effects: Analgesia: Melinda [name] reports >50% benefit. Functional ability: Patient reports that medication allows her to accomplish basic ADLs Clinically meaningful improvement in function (CMIF): Sustained CMIF goals met Perceived effectiveness: Described as relatively effective, allowing for increase in activities of daily living (ADL) Undesirable effects: Side-effects or Adverse reactions: None reported Monitoring: [place] PMP: Online review of the past 54-monthperiod conducted. Compliant with practice rules and regulations Last UDS on  record: No results found for: SUMMARY UDS interpretation: Compliant          Medication Assessment Form: Reviewed. Patient indicates being compliant with therapy Treatment compliance: Compliant Risk Assessment Profile: Aberrant behavior: See prior evaluations. None observed or detected today Comorbid factors increasing risk of overdose: See prior notes. No additional risks detected today Risk of substance use disorder (SUD): Low     Opioid Risk Tool - 04/02/17 1151      Family History of Substance Abuse   Alcohol Negative   Illegal Drugs Negative   Rx Drugs Negative     Personal History of Substance Abuse   Alcohol Negative   Illegal Drugs Negative   Rx Drugs Negative     History of Preadolescent Sexual Abuse   History of Preadolescent Sexual Abuse Negative or Female     Psychological Disease   Psychological Disease Positive   ADD Negative   OCD Negative   Bipolar Negative   Schizophrenia Negative   Depression Positive     Total Score   Opioid Risk Tool Scoring 3   Opioid Risk Interpretation Low Risk     ORT Scoring interpretation table:  Score <3 = Low Risk for SUD  Score between 4-7 = Moderate Risk for SUD  Score >8 = High Risk for Opioid Abuse   Risk Mitigation Strategies:  Patient Counseling: Covered Patient-Prescriber Agreement (PPA): Present and active  Notification to other healthcare providers: Done  Pharmacologic Plan: No change in therapy, at this time  Assessment  Primary Diagnosis & Pertinent Problem List: The primary encounter diagnosis was Chronic low back pain (Bilateral) (L>R). Diagnoses of Chronic knee pain (Location of Primary Source of Pain) (Right), Chronic pain syndrome, Musculoskeletal pain, Fibromyalgia, Primary osteoarthritis involving multiple joints, and Long term current use of opiate analgesic were also pertinent to this visit.  Status Diagnosis  Controlled Controlled Controlled 1. Chronic low back pain (Bilateral) (L>R)   2.  Chronic knee pain (Location of Primary Source of Pain) (Right)   3. Chronic pain syndrome   4. Musculoskeletal pain   5. Fibromyalgia   6. Primary osteoarthritis involving multiple joints   7. Long term current use of opiate analgesic  Problems updated and reviewed during this visit: No problems updated. Plan of Care   Imaging Orders  No imaging studies ordered today   Procedure Orders    No procedure(s) ordered today    Medications ordered for procedure: Meds ordered this encounter  Medications  . naloxone (NARCAN) 2 MG/2ML injection    Sig: Inject 1 mL (1 mg total) into the muscle as needed (for opioid overdose). Inject content of syringe into thigh muscle. Call 911.    Dispense:  2 Syringe    Refill:  1    NDC # [id number]. Please teach proper use of device.    Order Specific Question:   Supervising Provider    Answer:   [name] [[id number]]   Medications administered: Melinda [name] had no medications administered during this visit.  See the medical record for exact dosing, route, and time of administration.  New Prescriptions   NALOXONE (NARCAN) 2 MG/2ML INJECTION    Inject 1 mL (1 mg total) into the muscle as needed (for opioid overdose). Inject content of syringe into thigh muscle. Call 911.   Disposition: Discharge home  Discharge Date & Time: 04/02/2017; 1254 hrs.   Physician-requested Follow-up: Return for Pump refill based on programming. Future Appointments Date Time Provider [place]  05/02/2017 2:30 PM Vevelyn Francois, NP [facility] None  07/31/2017 11:45 AM [name], MD [facility] None   Primary Care Physician: Jearld Fenton, NP Location: [facility] Out[facility] Management Facility Note by: Dionisio David, NP Date: 04/02/2017; Time: 3:38 PM  Disclaimer:  Medicine is not an exact science. The only guarantee in medicine is that nothing is guaranteed. It is important to note that the decision to proceed with this intervention was based  on the information collected from the patient. The Data and conclusions were drawn from the patient's questionnaire, the interview, and the physical examination. Because the information was provided in large part by the patient, it cannot be guaranteed that it has not been purposely or unconsciously manipulated. Every effort has been made to obtain as much relevant data as possible for this evaluation. It is important to note that the conclusions that lead to this procedure are derived in large part from the available data. Always take into account that the treatment will also be dependent on availability of resources and existing treatment guidelines, considered by other Pain Management Practitioners as being common knowledge and practice, at the time of the intervention. For Medico-Legal purposes, it is also important to point out that variation in procedural techniques and pharmacological choices are the acceptable norm. The indications, contraindications, technique, and results of the above procedure should only be interpreted and judged by a Board-Certified Interventional Pain Specialist with extensive familiarity and expertise in the same exact procedure and technique.

## 2017-04-03 MED FILL — Medication: INTRATHECAL | Qty: 1 | Status: AC

## 2017-04-04 DIAGNOSIS — M5137 Other intervertebral disc degeneration, lumbosacral region: Secondary | ICD-10-CM | POA: Diagnosis not present

## 2017-04-04 DIAGNOSIS — M86071 Acute hematogenous osteomyelitis, right ankle and foot: Secondary | ICD-10-CM | POA: Diagnosis not present

## 2017-04-04 DIAGNOSIS — I2510 Atherosclerotic heart disease of native coronary artery without angina pectoris: Secondary | ICD-10-CM | POA: Diagnosis not present

## 2017-04-04 DIAGNOSIS — L03031 Cellulitis of right toe: Secondary | ICD-10-CM | POA: Diagnosis not present

## 2017-04-04 DIAGNOSIS — L02611 Cutaneous abscess of right foot: Secondary | ICD-10-CM | POA: Diagnosis not present

## 2017-04-04 DIAGNOSIS — E1169 Type 2 diabetes mellitus with other specified complication: Secondary | ICD-10-CM | POA: Diagnosis not present

## 2017-04-08 DIAGNOSIS — E1142 Type 2 diabetes mellitus with diabetic polyneuropathy: Secondary | ICD-10-CM | POA: Diagnosis not present

## 2017-04-08 DIAGNOSIS — L97513 Non-pressure chronic ulcer of other part of right foot with necrosis of muscle: Secondary | ICD-10-CM | POA: Diagnosis not present

## 2017-04-08 DIAGNOSIS — Z89421 Acquired absence of other right toe(s): Secondary | ICD-10-CM | POA: Diagnosis not present

## 2017-04-09 DIAGNOSIS — I2510 Atherosclerotic heart disease of native coronary artery without angina pectoris: Secondary | ICD-10-CM | POA: Diagnosis not present

## 2017-04-09 DIAGNOSIS — E1169 Type 2 diabetes mellitus with other specified complication: Secondary | ICD-10-CM | POA: Diagnosis not present

## 2017-04-09 DIAGNOSIS — M86071 Acute hematogenous osteomyelitis, right ankle and foot: Secondary | ICD-10-CM | POA: Diagnosis not present

## 2017-04-09 DIAGNOSIS — L02611 Cutaneous abscess of right foot: Secondary | ICD-10-CM | POA: Diagnosis not present

## 2017-04-09 DIAGNOSIS — M5137 Other intervertebral disc degeneration, lumbosacral region: Secondary | ICD-10-CM | POA: Diagnosis not present

## 2017-04-09 DIAGNOSIS — L03031 Cellulitis of right toe: Secondary | ICD-10-CM | POA: Diagnosis not present

## 2017-04-18 DIAGNOSIS — I2510 Atherosclerotic heart disease of native coronary artery without angina pectoris: Secondary | ICD-10-CM | POA: Diagnosis not present

## 2017-04-18 DIAGNOSIS — M86071 Acute hematogenous osteomyelitis, right ankle and foot: Secondary | ICD-10-CM | POA: Diagnosis not present

## 2017-04-18 DIAGNOSIS — M5137 Other intervertebral disc degeneration, lumbosacral region: Secondary | ICD-10-CM | POA: Diagnosis not present

## 2017-04-18 DIAGNOSIS — L03031 Cellulitis of right toe: Secondary | ICD-10-CM | POA: Diagnosis not present

## 2017-04-18 DIAGNOSIS — L02611 Cutaneous abscess of right foot: Secondary | ICD-10-CM | POA: Diagnosis not present

## 2017-04-18 DIAGNOSIS — E1169 Type 2 diabetes mellitus with other specified complication: Secondary | ICD-10-CM | POA: Diagnosis not present

## 2017-04-25 DIAGNOSIS — M5137 Other intervertebral disc degeneration, lumbosacral region: Secondary | ICD-10-CM | POA: Diagnosis not present

## 2017-04-25 DIAGNOSIS — L03031 Cellulitis of right toe: Secondary | ICD-10-CM | POA: Diagnosis not present

## 2017-04-25 DIAGNOSIS — L02611 Cutaneous abscess of right foot: Secondary | ICD-10-CM | POA: Diagnosis not present

## 2017-04-25 DIAGNOSIS — E1169 Type 2 diabetes mellitus with other specified complication: Secondary | ICD-10-CM | POA: Diagnosis not present

## 2017-04-25 DIAGNOSIS — M86071 Acute hematogenous osteomyelitis, right ankle and foot: Secondary | ICD-10-CM | POA: Diagnosis not present

## 2017-04-25 DIAGNOSIS — I2510 Atherosclerotic heart disease of native coronary artery without angina pectoris: Secondary | ICD-10-CM | POA: Diagnosis not present

## 2017-05-02 ENCOUNTER — Ambulatory Visit: Payer: Medicare Other | Attending: Nurse Practitioner | Admitting: Nurse Practitioner

## 2017-05-02 ENCOUNTER — Encounter: Payer: Self-pay | Admitting: Nurse Practitioner

## 2017-05-02 VITALS — BP 137/77 | HR 73 | Temp 99.0°F | Resp 16 | Ht 70.0 in | Wt 219.5 lb

## 2017-05-02 DIAGNOSIS — Z9049 Acquired absence of other specified parts of digestive tract: Secondary | ICD-10-CM | POA: Insufficient documentation

## 2017-05-02 DIAGNOSIS — M4800 Spinal stenosis, site unspecified: Secondary | ICD-10-CM | POA: Diagnosis not present

## 2017-05-02 DIAGNOSIS — E1142 Type 2 diabetes mellitus with diabetic polyneuropathy: Secondary | ICD-10-CM | POA: Insufficient documentation

## 2017-05-02 DIAGNOSIS — F329 Major depressive disorder, single episode, unspecified: Secondary | ICD-10-CM | POA: Insufficient documentation

## 2017-05-02 DIAGNOSIS — Z5181 Encounter for therapeutic drug level monitoring: Secondary | ICD-10-CM | POA: Diagnosis not present

## 2017-05-02 DIAGNOSIS — M5441 Lumbago with sciatica, right side: Secondary | ICD-10-CM

## 2017-05-02 DIAGNOSIS — M159 Polyosteoarthritis, unspecified: Secondary | ICD-10-CM

## 2017-05-02 DIAGNOSIS — M25561 Pain in right knee: Secondary | ICD-10-CM | POA: Diagnosis not present

## 2017-05-02 DIAGNOSIS — M797 Fibromyalgia: Secondary | ICD-10-CM | POA: Diagnosis not present

## 2017-05-02 DIAGNOSIS — M545 Low back pain: Secondary | ICD-10-CM | POA: Insufficient documentation

## 2017-05-02 DIAGNOSIS — M7918 Myalgia, other site: Secondary | ICD-10-CM

## 2017-05-02 DIAGNOSIS — Z79891 Long term (current) use of opiate analgesic: Secondary | ICD-10-CM | POA: Diagnosis not present

## 2017-05-02 DIAGNOSIS — G894 Chronic pain syndrome: Secondary | ICD-10-CM

## 2017-05-02 DIAGNOSIS — D649 Anemia, unspecified: Secondary | ICD-10-CM | POA: Diagnosis not present

## 2017-05-02 DIAGNOSIS — I2510 Atherosclerotic heart disease of native coronary artery without angina pectoris: Secondary | ICD-10-CM | POA: Insufficient documentation

## 2017-05-02 DIAGNOSIS — Z9889 Other specified postprocedural states: Secondary | ICD-10-CM | POA: Diagnosis not present

## 2017-05-02 DIAGNOSIS — G4730 Sleep apnea, unspecified: Secondary | ICD-10-CM | POA: Diagnosis not present

## 2017-05-02 DIAGNOSIS — G8929 Other chronic pain: Secondary | ICD-10-CM | POA: Diagnosis not present

## 2017-05-02 DIAGNOSIS — K589 Irritable bowel syndrome without diarrhea: Secondary | ICD-10-CM | POA: Insufficient documentation

## 2017-05-02 DIAGNOSIS — Z8249 Family history of ischemic heart disease and other diseases of the circulatory system: Secondary | ICD-10-CM | POA: Diagnosis not present

## 2017-05-02 DIAGNOSIS — Z833 Family history of diabetes mellitus: Secondary | ICD-10-CM | POA: Insufficient documentation

## 2017-05-02 DIAGNOSIS — Z87891 Personal history of nicotine dependence: Secondary | ICD-10-CM | POA: Diagnosis not present

## 2017-05-02 DIAGNOSIS — M542 Cervicalgia: Secondary | ICD-10-CM

## 2017-05-02 DIAGNOSIS — M150 Primary generalized (osteo)arthritis: Secondary | ICD-10-CM

## 2017-05-02 DIAGNOSIS — Z955 Presence of coronary angioplasty implant and graft: Secondary | ICD-10-CM | POA: Diagnosis not present

## 2017-05-02 DIAGNOSIS — Z808 Family history of malignant neoplasm of other organs or systems: Secondary | ICD-10-CM | POA: Diagnosis not present

## 2017-05-02 DIAGNOSIS — Z801 Family history of malignant neoplasm of trachea, bronchus and lung: Secondary | ICD-10-CM | POA: Insufficient documentation

## 2017-05-02 DIAGNOSIS — M1991 Primary osteoarthritis, unspecified site: Secondary | ICD-10-CM | POA: Diagnosis not present

## 2017-05-02 DIAGNOSIS — F419 Anxiety disorder, unspecified: Secondary | ICD-10-CM | POA: Insufficient documentation

## 2017-05-02 DIAGNOSIS — M8949 Other hypertrophic osteoarthropathy, multiple sites: Secondary | ICD-10-CM

## 2017-05-02 MED ORDER — OXYCODONE HCL 5 MG PO TABS: 5.0000 mg | ORAL_TABLET | Freq: Three times a day (TID) | ORAL | 0 refills | Status: DC | PRN

## 2017-05-02 MED ORDER — PREGABALIN 100 MG PO CAPS: 100.0000 mg | ORAL_CAPSULE | Freq: Three times a day (TID) | ORAL | 0 refills | Status: DC

## 2017-05-02 MED ORDER — MELOXICAM 15 MG PO TABS: 15.0000 mg | ORAL_TABLET | Freq: Every day | ORAL | 0 refills | Status: DC

## 2017-05-02 MED ORDER — TIZANIDINE HCL 4 MG PO CAPS: 4.0000 mg | ORAL_CAPSULE | Freq: Three times a day (TID) | ORAL | 0 refills | Status: DC

## 2017-05-02 NOTE — Progress Notes (Signed)
Patient's Name: Melinda [name]  MRN: [id number]  Referring Provider: Jearld Fenton, NP  DOB: 1956/01/19  PCP: Jearld Fenton, NP  DOS: 05/02/2017  Note by: Vevelyn Francois NP  Service setting: Ambulatory outpatient  Specialty: Interventional Pain Management  Location: [facility] (AMB) Pain Management Facility    Patient type: Established    Primary Reason(s) for Visit: Encounter for prescription drug management. (Level of risk: moderate)  CC: Back Pain (lower, DDD); Generalized Body Aches; and Knee Pain (right )  HPI  Melinda [name] is a 61 y.o. year old, female patient, who comes today for a medication management evaluation. She has Chronic pain syndrome; CAD (coronary artery disease); Obesity (BMI 30-39.9); Pure hypercholesterolemia; DM2 (diabetes mellitus, type 2) ([place]); Great toe amputation status ([place]); Failed back surgical syndrome; Failed cervical surgery syndrome (ACDF); Chronic neck pain; Chronic low back pain (Bilateral) (L>R); Epidural fibrosis; Cervical spondylosis; Neuropathic pain; Fibromyalgia; Presence of functional implant (Medtronic programmable intrathecal pump) (Right abdominal area); Presence of intrathecal pump (Medtronic intrathecal programmable pump) (40 mL pump); Opioid-induced constipation (OIC); Chronic knee pain (Location of Primary Source of Pain) (Right); Osteoarthritis of knee (Left); Depression; Long term current use of opiate analgesic; Long term prescription opiate use; Opiate use; Encounter for adjustment or management of infusion pump; Osteoarthritis, multiple sites; Musculoskeletal pain; Osteoarthritis of knee (Bilateral) (R>L); Paronychia of great toe of left foot; and Osteomyelitis ([place]) on her problem list. Her primarily concern today is the Back Pain (lower, DDD); Generalized Body Aches; and Knee Pain (right )  Pain Assessment: Location: Lower, Left Back (see visit info) Radiating: na Onset: More than a month ago Duration: Chronic pain Quality: Sharp (knee is sharp  when walking) Severity: 3 /10 (self-reported pain score)  Note: Reported level is compatible with observation.                          Effect on ADL: pace self, slows her down Timing: Intermittent Modifying factors: medications, rest  Melinda [name] was last scheduled for an appointment on 04/02/2017 for medication management. During today's appointment we reviewed Melinda [name]'s chronic pain status, as well as her outpatient medication regimen. She admits that she has increased pain in the evening. She states that the Meloxicam is effective and she would like to have this bid. She states that her Oxycodone does not seem to last the 8 hours. She does not work.  The patient  reports that she does not use drugs. Her body mass index is 31.49 kg/m.  Further details on both, my assessment(s), as well as the proposed treatment plan, please see below.  Controlled Substance Pharmacotherapy Assessment REMS (Risk Evaluation and Mitigation Strategy)  Analgesic: Oxycodone IR 5 mg every 8 hours (15 mg/day), in addition to the intrathecal pump medication.  MME/day:22 mg/day of oral medication Janett Billow, RN  05/02/2017  3:06 PM  Sign at close encounter Nursing Pain Medication Assessment:  Safety precautions to be maintained throughout the outpatient stay will include: orient to surroundings, keep bed in low position, maintain call bell within reach at all times, provide assistance with transfer out of bed and ambulation.  Medication Inspection Compliance: Pill count conducted under aseptic conditions, in front of the patient. Neither the pills nor the bottle was removed from the patient's sight at any time. Once count was completed pills were immediately returned to the patient in their original bottle.  Medication: Oxycodone IR Pill/Patch Count: 13 of 90 pills remain Pill/Patch Appearance:  Markings consistent with prescribed medication Bottle Appearance: Standard pharmacy container. Clearly  labeled. Filled Date: 10 / 02 / 2018 Last Medication intake:  Today   Pharmacokinetics: Liberation and absorption (onset of action): WNL Distribution (time to peak effect): WNL Metabolism and excretion (duration of action): WNL         Pharmacodynamics: Desired effects: Analgesia: Melinda [name] reports >50% benefit. Functional ability: Patient reports that medication allows her to accomplish basic ADLs Clinically meaningful improvement in function (CMIF): Sustained CMIF goals met Perceived effectiveness: Described as relatively effective, allowing for increase in activities of daily living (ADL) Undesirable effects: Side-effects or Adverse reactions: None reported Monitoring: [place] PMP: Online review of the past 30-monthperiod conducted. Compliant with practice rules and regulations Last UDS on record: No results found for: SUMMARY UDS interpretation: Compliant          Medication Assessment Form: Reviewed. Patient indicates being compliant with therapy Treatment compliance: Compliant Risk Assessment Profile: Aberrant behavior: See prior evaluations. None observed or detected today Comorbid factors increasing risk of overdose: See prior notes. No additional risks detected today Risk of substance use disorder (SUD): Low     Opioid Risk Tool - 04/02/17 1151      Family History of Substance Abuse   Alcohol Negative   Illegal Drugs Negative   Rx Drugs Negative     Personal History of Substance Abuse   Alcohol Negative   Illegal Drugs Negative   Rx Drugs Negative     History of Preadolescent Sexual Abuse   History of Preadolescent Sexual Abuse Negative or Female     Psychological Disease   Psychological Disease Positive   ADD Negative   OCD Negative   Bipolar Negative   Schizophrenia Negative   Depression Positive     Total Score   Opioid Risk Tool Scoring 3   Opioid Risk Interpretation Low Risk     ORT Scoring interpretation table:  Score <3 = Low Risk for SUD  Score  between 4-7 = Moderate Risk for SUD  Score >8 = High Risk for Opioid Abuse   Risk Mitigation Strategies:  Patient Counseling: Covered Patient-Prescriber Agreement (PPA): Present and active  Notification to other healthcare providers: Done  Pharmacologic Plan: No change in therapy, at this time  Laboratory Chemistry  Inflammation Markers (CRP: Acute Phase) (ESR: Chronic Phase) Lab Results  Component Value Date   CRP 0.2 09/18/2010   ESRSEDRATE 16 09/18/2010                 Renal Function Markers Lab Results  Component Value Date   BUN 24 (H) 02/23/2017   CREATININE 0.97 02/23/2017   GFRAA >60 02/23/2017   GFRNONAA >60 02/23/2017                 Hepatic Function Markers Lab Results  Component Value Date   AST 20 05/18/2016   ALT 18 05/18/2016   ALBUMIN 4.8 05/18/2016   ALKPHOS 86 05/18/2016   HCVAB NEGATIVE 05/18/2016                 Electrolytes Lab Results  Component Value Date   NA 141 02/23/2017   K 4.5 02/23/2017   CL 108 02/23/2017   CALCIUM 8.4 (L) 02/23/2017   MG 2.1 06/30/2013                 Neuropathy Markers Lab Results  Component Value Date   VITAMINB12 319 10/04/2013  Bone Pathology Markers Lab Results  Component Value Date   ALKPHOS 86 05/18/2016   CALCIUM 8.4 (L) 02/23/2017                 Coagulation Parameters Lab Results  Component Value Date   INR 1.12 09/11/2015   LABPROT 14.6 09/11/2015   APTT 31 09/11/2015   PLT 157 02/23/2017                 Cardiovascular Markers Lab Results  Component Value Date   HGB 11.3 (L) 02/23/2017   HCT 32.7 (L) 02/23/2017                 Note: Lab results reviewed.  Recent Diagnostic Imaging Results  MR FOOT RIGHT WO CONTRAST CLINICAL DATA:  Patient status post amputation of the right second toe for gangrene 02/01/2017. The patient experienced a small dehiscence of the surgical wound 2 days after surgery. She began to develop fever, chills in the malaise  02/21/2017.  EXAM: MRI OF THE RIGHT FOREFOOT WITHOUT CONTRAST  TECHNIQUE: Multiplanar, multisequence MR imaging of the right forefoot was performed. No intravenous contrast was administered.  COMPARISON:  Plain films right foot earlier today and 09/20/2014.  FINDINGS: Bones/Joint/Cartilage  The patient is status post amputation of both the first and second toes at the level of the MTP joint. There is no bone marrow signal abnormality to suggest osteomyelitis. Minimal edema in the medial sesamoid bone is compatible with sesamoid dysfunction. The patient is status post hallux valgus repair.  Ligaments  Intact.  Muscles and Tendons  Atrophy of intrinsic musculature the foot is noted. No intramuscular fluid collection.  Soft tissues  A small skin wound is seen on the dorsum of the foot at the amputation site of the second toe. Subjacent to the wound, a fluid collection measuring 0.5 cm craniocaudal by 0.9 cm transverse by 0.1 cm long is contiguous with the head of the first metatarsal and appears to communicate with the skin wound. Mild soft tissue edema is seen at the amputation site.  IMPRESSION: Status post amputation of the first and second toes. There is a small skin wound in the dorsal soft tissues at the amputation site of the second toe. Tiny underlying fluid collection is contiguous with the first metatarsal head. This fluid could be due to postoperative seroma or infection. The examination is negative for osteomyelitis.  Electronically Signed   By: Inge Rise M.D.   On: 02/22/2017 14:06 DG Foot Complete Right CLINICAL DATA:  Pt has an abcess on the base of the 2nd right toe. She has had the first 2 toes removed. Hx of diabetes.  EXAM: RIGHT FOOT COMPLETE - 3+ VIEW  COMPARISON:  None.  FINDINGS: Status post amputations of the first and second toes at the MTP joints. No osseous erosions, destructive changes or other secondary signs of  osteomyelitis at the adjacent metatarsal heads. Adjacent soft tissues are unremarkable without discrete evidence of fluid collection or abscess. No suspicious soft tissue gas.  There is through fifth toes are unremarkable. Osseous structures of the hindfoot and midfoot are unremarkable.  IMPRESSION: 1. No acute findings. No evidence of osteomyelitis seen. No convincing evidence of soft tissue abscess or soft tissue gas. 2. Amputation deformities at the first and second MTP joints, without evidence of surgical complicating feature on this plain film examination.  Electronically Signed   By: Franki Cabot M.D.   On: 02/22/2017 11:38  Complexity Note: Imaging results reviewed. Results shared  with Melinda [name], using Layman's terms.                         Meds   Current Outpatient Prescriptions:  .  ACCU-CHEK SOFTCLIX LANCETS lancets, Use to check blood sugar 2 times daily.  Dx: E11.42, Disp: 100 each, Rfl: 2 .  acetaminophen (TYLENOL) 500 MG tablet, Take 1,000 mg by mouth every 6 (six) hours as needed (for pain/headache.)., Disp: , Rfl:  .  AMBULATORY NON FORMULARY MEDICATION, Medication Name: CPAP MASK OF CHOICE FOR HOME DEVICE, Disp: 1 each, Rfl: 0 .  aspirin 81 MG tablet, Take 81 mg by mouth daily., Disp: , Rfl:  .  buPROPion (WELLBUTRIN XL) 150 MG 24 hr tablet, TAKE 1 TABLET BY MOUTH DAILY, Disp: 90 tablet, Rfl: 0 .  Cholecalciferol (VITAMIN D3) 2000 units TABS, Take 2,000 Units by mouth daily., Disp: , Rfl:  .  Chromium Picolinate 800 MCG TABS, Take 800 mcg by mouth daily., Disp: , Rfl:  .  Cinnamon Bark POWD, Take 1 capsule by mouth daily., Disp: , Rfl:  .  Coenzyme Q10 (CO Q 10) 100 MG CAPS, Take 200 mg by mouth daily. , Disp: , Rfl:  .  glucosamine-chondroitin 500-400 MG tablet, Take 1 tablet by mouth daily. , Disp: , Rfl:  .  glucose blood (ACCU-CHEK AVIVA PLUS) test strip, TEST 2 TIMES DAY  **E11.9**, Disp: 200 each, Rfl: 1 .  glucose blood test strip, 1 each by Other route.  Use as instructed, Disp: , Rfl:  .  GRAPE SEED EXTRACT PO, Take 1 capsule by mouth daily., Disp: , Rfl:  .  ibuprofen (ADVIL,MOTRIN) 200 MG tablet, Take 600 mg by mouth every 8 (eight) hours as needed (for pain.)., Disp: , Rfl:  .  MAGNESIUM MALATE PO, Take 115 mg by mouth daily., Disp: , Rfl:  .  [START ON 05/05/2017] meloxicam (MOBIC) 15 MG tablet, Take 1 tablet (15 mg total) by mouth daily., Disp: 90 tablet, Rfl: 0 .  Multiple Vitamin (MULTIVITAMIN) tablet, Take 1 tablet by mouth daily., Disp: , Rfl:  .  naloxone (NARCAN) 2 MG/2ML injection, Inject 1 mL (1 mg total) into the muscle as needed (for opioid overdose). Inject content of syringe into thigh muscle. Call 911., Disp: 2 Syringe, Rfl: 1 .  nitroGLYCERIN (NITROSTAT) 0.4 MG SL tablet, Place 1 tablet (0.4 mg total) under the tongue every 5 (five) minutes as needed for chest pain., Disp: 25 tablet, Rfl: prn .  Omega-3 Fatty Acids (FISH OIL) 1200 MG CAPS, Take 1,200 mg by mouth daily. , Disp: , Rfl:  .  PARoxetine (PAXIL) 20 MG tablet, TAKE ONE-HALF OF A TABLET BY MOUTH TWICEDAILY (Patient taking differently: TAKE ONE-HALF OF A TABLET BY MOUTH DAILY IN THE MORNING), Disp: 90 tablet, Rfl: 2 .  polyethylene glycol (MIRALAX / GLYCOLAX) packet, Take 17 g by mouth daily as needed (for constipation.). , Disp: , Rfl:  .  [START ON 05/05/2017] pregabalin (LYRICA) 100 MG capsule, Take 1 capsule (100 mg total) by mouth 3 (three) times daily., Disp: 270 capsule, Rfl: 0 .  rosuvastatin (CRESTOR) 20 MG tablet, Take 1 tablet (20 mg total) by mouth daily., Disp: 90 tablet, Rfl: 3 .  [START ON 05/05/2017] tiZANidine (ZANAFLEX) 4 MG capsule, Take 1 capsule (4 mg total) by mouth 3 (three) times daily., Disp: 270 capsule, Rfl: 0 .  vitamin E 400 UNIT capsule, Take 400 Units by mouth daily., Disp: , Rfl:  .  [  START ON 05/05/2017] oxyCODONE (OXY IR/ROXICODONE) 5 MG immediate release tablet, Take 1 tablet (5 mg total) by mouth every 8 (eight) hours as needed for severe  pain., Disp: 90 tablet, Rfl: 0 .  [START ON 06/04/2017] oxyCODONE (OXY IR/ROXICODONE) 5 MG immediate release tablet, Take 1 tablet (5 mg total) by mouth every 8 (eight) hours as needed for severe pain., Disp: 90 tablet, Rfl: 0 .  oxyCODONE (OXY IR/ROXICODONE) 5 MG immediate release tablet, Take 1 tablet (5 mg total) by mouth 3 (three) times daily as needed for severe pain., Disp: 90 tablet, Rfl: 0 .  PAIN MANAGEMENT IT PUMP REFILL, 1 each by Intrathecal route once. Medication: PF Fentanyl 500.0 mcg/ml PF Baclofen 300.61mg/ml PF Bupivicaine 20.0 mg/ml Total Volume: 40 ml Needed by 04-02-17 @ 1000, Disp: 1 each, Rfl: 0  ROS  Constitutional: Denies any fever or chills Gastrointestinal: No reported hemesis, hematochezia, vomiting, or acute GI distress Musculoskeletal: Denies any acute onset joint swelling, redness, loss of ROM, or weakness Neurological: No reported episodes of acute onset apraxia, aphasia, dysarthria, agnosia, amnesia, paralysis, loss of coordination, or loss of consciousness  Allergies  Melinda [name] No Known Allergies.  [place] [facility]: Melinda [name] reports that she does not use drugs. Alcohol:  reports that she drinks alcohol. Tobacco:  reports that she quit smoking about 9 years ago. Her smoking use included Cigarettes. She has a 48.00 pack-year smoking history. She has never used smokeless tobacco. Medical:  has a past medical history of Anemia; Anxiety; Blue toes; Bulging disc; CAD (coronary artery disease) (2009); Cardiac arrhythmia due to congenital heart disease; Chicken pox; Coronary arteritis; Degenerative disc disease; Depression; Diabetes mellitus without complication ([place]; Facet joint disease; Fibromyalgia; Heart disease; Hyperlipidemia; IBS (irritable bowel syndrome); MCL deficiency, knee; MRSA (methicillin resistant staph aureus) culture positive (2011); Neuropathy (04/18/2010); Peripheral neuropathy; Sleep apnea; and Spinal stenosis. Surgical: Ms. CAlguire has a past  surgical history that includes Foot surgery (Right); Hand surgery (Left); Gallbladder surgery; Ablation; Ablation; HEART STENT (2009); HAND SURGEY (Left); Foot surgery (Bilateral); Foot surgery (Right); Infusion pump implantation; Back surgery; Cholecystectomy (2003); Anterior cervical decomp/discectomy fusion (N/A, 07/28/2014); Coronary angioplasty; Carpal tunnel release (Right); Eye surgery (Bilateral, 2013); Amputation toe (Right, 02/01/2017); Irrigation and debridement foot (Right, 02/23/2017); and Toe Surgery. Family: family history includes Colon cancer in her paternal grandfather; Diabetes in her father and paternal grandmother; Heart disease in her maternal grandfather; Lung cancer in her mother.  Constitutional Exam  General appearance: Well nourished, well developed, and well hydrated. In no apparent acute distress Vitals:   05/02/17 1458  BP: 137/77  Pulse: 73  Resp: 16  Temp: 99 F (37.2 C)  TempSrc: Oral  SpO2: 97%  Weight: 219 lb 8 oz (99.6 kg)  Height: _0  (1.778 m)   BMI Assessment: Estimated body mass index is 31.49 kg/m as calculated from the following:   Height as of this encounter: _1  (1.778 m).   Weight as of this encounter: 219 lb 8 oz (99.6 kg). Psych/Mental status: Alert, oriented x 3 (person, place, & time)       Eyes: PERLA Respiratory: No evidence of acute respiratory distress  Cervical Spine Area Exam  Skin & Axial Inspection: No masses, redness, edema, swelling, or associated skin lesions Alignment: Symmetrical Functional ROM: Unrestricted ROM      Stability: No instability detected Muscle Tone/Strength: Functionally intact. No obvious neuro-muscular anomalies detected. Sensory (Neurological): Unimpaired Palpation: No palpable anomalies  Upper Extremity (UE) Exam    Side: Right upper extremity  Side: Left upper extremity  Skin & Extremity Inspection: Skin color, temperature, and hair growth are WNL. No peripheral edema or cyanosis. No  masses, redness, swelling, asymmetry, or associated skin lesions. No contractures.  Skin & Extremity Inspection: Skin color, temperature, and hair growth are WNL. No peripheral edema or cyanosis. No masses, redness, swelling, asymmetry, or associated skin lesions. No contractures.  Functional ROM: Unrestricted ROM          Functional ROM: Unrestricted ROM          Muscle Tone/Strength: Functionally intact. No obvious neuro-muscular anomalies detected.  Muscle Tone/Strength: Functionally intact. No obvious neuro-muscular anomalies detected.  Sensory (Neurological): Unimpaired          Sensory (Neurological): Unimpaired          Palpation: No palpable anomalies              Palpation: No palpable anomalies              Specialized Test(s): Deferred         Specialized Test(s): Deferred          Thoracic Spine Area Exam  Skin & Axial Inspection: No masses, redness, or swelling Alignment: Symmetrical Functional ROM: Unrestricted ROM Stability: No instability detected Muscle Tone/Strength: Functionally intact. No obvious neuro-muscular anomalies detected. Sensory (Neurological): Unimpaired Muscle strength & Tone: No palpable anomalies  Lumbar Spine Area Exam  Skin & Axial Inspection: No masses, redness, or swelling Alignment: Symmetrical Functional ROM: Unrestricted ROM      Stability: No instability detected Muscle Tone/Strength: Functionally intact. No obvious neuro-muscular anomalies detected. Sensory (Neurological): Unimpaired Palpation: No palpable anomalies       Provocative Tests: Lumbar Hyperextension and rotation test: evaluation deferred today       Lumbar Lateral bending test: evaluation deferred today       Patrick's Maneuver: evaluation deferred today                    Gait & Posture Assessment  Ambulation: Unassisted Gait: Relatively normal for age and body habitus Posture: WNL   Lower Extremity Exam    Side: Right lower extremity  Side: Left lower extremity  Skin &  Extremity Inspection: Skin color, temperature, and hair growth are WNL. No peripheral edema or cyanosis. No masses, redness, swelling, asymmetry, or associated skin lesions. No contractures.  Skin & Extremity Inspection: Skin color, temperature, and hair growth are WNL. No peripheral edema or cyanosis. No masses, redness, swelling, asymmetry, or associated skin lesions. No contractures.  Functional ROM: Unrestricted ROM          Functional ROM: Unrestricted ROM          Muscle Tone/Strength: Functionally intact. No obvious neuro-muscular anomalies detected.  Muscle Tone/Strength: Functionally intact. No obvious neuro-muscular anomalies detected.  Sensory (Neurological): Unimpaired  Sensory (Neurological): Unimpaired  Palpation: No palpable anomalies  Palpation: No palpable anomalies   Assessment  Primary Diagnosis & Pertinent Problem List: The primary encounter diagnosis was Chronic low back pain (Bilateral) (L>R). Diagnoses of Chronic knee pain (Location of Primary Source of Pain) (Right), Primary osteoarthritis involving multiple joints, Musculoskeletal pain, Chronic neck pain, Fibromyalgia, Chronic pain syndrome, and Long term current use of opiate analgesic were also pertinent to this visit.  Status Diagnosis  Controlled Controlled Controlled 1. Chronic low back pain (Bilateral) (L>R)   2. Chronic knee pain (Location of Primary Source of Pain) (Right)   3. Primary  osteoarthritis involving multiple joints   4. Musculoskeletal pain   5. Chronic neck pain   6. Fibromyalgia   7. Chronic pain syndrome   8. Long term current use of opiate analgesic     Problems updated and reviewed during this visit: No problems updated. Plan of Care  Pharmacotherapy (Medications Ordered): Meds ordered this encounter  Medications  . pregabalin (LYRICA) 100 MG capsule    Sig: Take 1 capsule (100 mg total) by mouth 3 (three) times daily.    Dispense:  270 capsule    Refill:  0    Do not place this  medication, or any other prescription from our practice, on "Automatic Refill". Patient may have prescription filled one day early if pharmacy is closed on scheduled refill date.    Order Specific Question:   Supervising Provider    Answer:   [name] [phone]  . tiZANidine (ZANAFLEX) 4 MG capsule    Sig: Take 1 capsule (4 mg total) by mouth 3 (three) times daily.    Dispense:  270 capsule    Refill:  0    Do not add this medication to the electronic "Automatic Refill" notification system. Patient may have prescription filled one day early if pharmacy is closed on scheduled refill date.    Order Specific Question:   Supervising Provider    Answer:   [name] [phone]  . meloxicam (MOBIC) 15 MG tablet    Sig: Take 1 tablet (15 mg total) by mouth daily.    Dispense:  90 tablet    Refill:  0    Do not place medication on "Automatic Refill". Fill one day early if pharmacy is closed on scheduled refill date.    Order Specific Question:   Supervising Provider    Answer:   [name] [phone]  . oxyCODONE (OXY IR/ROXICODONE) 5 MG immediate release tablet    Sig: Take 1 tablet (5 mg total) by mouth every 8 (eight) hours as needed for severe pain.    Dispense:  90 tablet    Refill:  0    Do not place this medication, or any other prescription from our practice, on "Automatic Refill". Patient may have prescription filled one day early if pharmacy is closed on scheduled refill date. Do not fill until:05/05/2017 To last until:06/04/2017    Order Specific Question:   Supervising Provider    Answer:   [name] [phone]  . oxyCODONE (OXY IR/ROXICODONE) 5 MG immediate release tablet    Sig: Take 1 tablet (5 mg total) by mouth every 8 (eight) hours as needed for severe pain.    Dispense:  90 tablet    Refill:  0    Do not place this medication, or any other prescription from our practice, on "Automatic Refill". Patient may have prescription filled one day early if  pharmacy is closed on scheduled refill date. Do not fill until:06/04/2017 To last until: 07/04/2017    Order Specific Question:   Supervising Provider    Answer:   [name] [phone]  . oxyCODONE (OXY IR/ROXICODONE) 5 MG immediate release tablet    Sig: Take 1 tablet (5 mg total) by mouth 3 (three) times daily as needed for severe pain.    Dispense:  90 tablet    Refill:  0    Do not place this medication, or any other prescription from our practice, on "Automatic Refill". Patient may have prescription filled one day early if pharmacy is closed on scheduled refill date.  Do not fill until: 07/04/2017 To last until:08/03/2017    Order Specific Question:   Supervising Provider    Answer:   [name] [phone]   New Prescriptions   OXYCODONE (OXY IR/ROXICODONE) 5 MG IMMEDIATE RELEASE TABLET    Take 1 tablet (5 mg total) by mouth 3 (three) times daily as needed for severe pain.   Medications administered today: Melinda [name] had no medications administered during this visit. Lab-work, procedure(s), and/or referral(s): Orders Placed This Encounter  Procedures  . ToxASSURE Select 13 (MW), Urine   Imaging and/or referral(s): None  Interventional management options: Planned, scheduled, and/or pending:   Intrathecal pump refill.    Considering:   Diagnostic Right knee genicular nerveblocks  Possible right knee genicular RFA Palliative cervical epiduralsteroid injection  Diagnostic cervical facetblock  Possible bilateral cervical facet radiofrequencyablation    Palliative PRN treatment(s):   Cervical epiduralsteroid injection  Right diagnostic genicularnerve block    Provider-requested follow-up: Return in about 3 months (around 08/02/2017) for MedMgmt.  Future Appointments Date Time Provider [id number]  07/31/2017 11:45 AM [name], MD [facility] None   Primary Care Physician: Jearld Fenton, NP Location: [facility]  Facility Note by: Vevelyn Francois NP Date: 05/02/2017; Time: 3:27 PM  Pain Score Disclaimer: We use the NRS-11 scale. This is a self-reported, subjective measurement of pain severity with only modest accuracy. It is used primarily to identify changes within a particular patient. It must be understood that outpatient pain scales are significantly less accurate that those used for research, where they can be applied under ideal controlled circumstances with minimal exposure to variables. In reality, the score is likely to be a combination of pain intensity and pain affect, where pain affect describes the degree of emotional arousal or changes in action readiness caused by the sensory experience of pain. Factors such as social and work situation, setting, emotional state, anxiety levels, expectation, and prior pain experience may influence pain perception and show large inter-individual differences that may also be affected by time variables.  Patient instructions provided during this appointment: Patient Instructions    ____________________________________________________________________________________________  Medication Rules  Applies to: All patients receiving prescriptions (written or electronic).  Pharmacy of record: Pharmacy where electronic prescriptions will be sent. If written prescriptions are taken to a different pharmacy, please inform the nursing staff. The pharmacy listed in the electronic medical record should be the one where you would like electronic prescriptions to be sent.  Prescription refills: Only during scheduled appointments. Applies to both, written and electronic prescriptions.  NOTE: The following applies primarily to controlled substances (Opioid* Pain Medications).   Patient's responsibilities: 1. Pain Pills: Bring all pain pills to every appointment (except for procedure appointments). 2. Pill Bottles: Bring pills in original pharmacy bottle. Always bring newest  bottle. Bring bottle, even if empty. 3. Medication refills: You are responsible for knowing and keeping track of what medications you need refilled. The day before your appointment, write a list of all prescriptions that need to be refilled. Bring that list to your appointment and give it to the admitting nurse. Prescriptions will be written only during appointments. If you forget a medication, it will not be "Called in", "Faxed", or "electronically sent". You will need to get another appointment to get these prescribed. 4. Prescription Accuracy: You are responsible for carefully inspecting your prescriptions before leaving our office. Have the discharge nurse carefully go over each prescription with you, before taking them home. Make sure that your name  is accurately spelled, that your address is correct. Check the name and dose of your medication to make sure it is accurate. Check the number of pills, and the written instructions to make sure they are clear and accurate. Make sure that you are given enough medication to last until your next medication refill appointment. 5. Taking Medication: Take medication as prescribed. Never take more pills than instructed. Never take medication more frequently than prescribed. Taking less pills or less frequently is permitted and encouraged, when it comes to controlled substances (written prescriptions).  6. Inform other Doctors: Always inform, all of your healthcare providers, of all the medications you take. 7. Pain Medication from other Providers: You are not allowed to accept any additional pain medication from any other Doctor or Healthcare provider. There are two exceptions to this rule. (see below) In the event that you require additional pain medication, you are responsible for notifying us, as stated below. 8. Medication Agreement: You are responsible for carefully reading and following our Medication Agreement. This must be signed before receiving any  prescriptions from our practice. Safely store a copy of your signed Agreement. Violations to the Agreement will result in no further prescriptions. (Additional copies of our Medication Agreement are available upon request.) 9. Laws, Rules, & Regulations: All patients are expected to follow all Federal and [employer], Rules, [employer]. Ignorance of the Laws does not constitute a valid excuse. The use of any illegal substances is prohibited. 10. Adopted CDC guidelines & recommendations: Target dosing levels will be at or below 60 MME/day. Use of benzodiazepines** is not recommended.  Exceptions: There are only two exceptions to the rule of not receiving pain medications from other Healthcare Providers. 1. Exception #1 (Emergencies): In the event of an emergency (i.e.: accident requiring emergency care), you are allowed to receive additional pain medication. However, you are responsible for: As soon as you are able, call our office [phone], at any time of the day or night, and leave a message stating your name, the date and nature of the emergency, and the name and dose of the medication prescribed. In the event that your call is answered by a member of our staff, make sure to document and save the date, time, and the name of the person that took your information.  2. Exception #2 (Planned Surgery): In the event that you are scheduled by another doctor or dentist to have any type of surgery or procedure, you are allowed (for a period no longer than 30 days), to receive additional pain medication, for the acute post-op pain. However, in this case, you are responsible for picking up a copy of our "Post-op Pain Management for Surgeons" handout, and giving it to your surgeon or dentist. This document is available at our office, and does not require an appointment to obtain it. Simply go to our office during business hours (Monday-Thursday from 8:00 AM to 4:00 PM) (Friday 8:00 AM to 12:00 Noon) or  if you have a scheduled appointment with [place], prior to your surgery, and ask for it by name. In addition, you will need to provide [place] with your name, name of your surgeon, type of surgery, and date of procedure or surgery.  *Opioid medications include: morphine, codeine, oxycodone, oxymorphone, hydrocodone, hydromorphone, meperidine, tramadol, tapentadol, buprenorphine, fentanyl, methadone. **Benzodiazepine medications include: diazepam (Valium), alprazolam (Xanax), clonazepam (Klonopine), lorazepam (Ativan), clorazepate (Tranxene), chlordiazepoxide (Librium), estazolam (Prosom), oxazepam (Serax), temazepam (Restoril), triazolam (Halcion)  ____________________________________________________________________________________________  BMI Assessment: Estimated body mass  index is 31.49 kg/m as calculated from the following:   Height as of this encounter: _0  (1.778 m).   Weight as of this encounter: 219 lb 8 oz (99.6 kg).  BMI interpretation table: BMI level Category Range association with higher incidence of chronic pain  <18 kg/m2 Underweight   18.5-24.9 kg/m2 Ideal body weight   25-29.9 kg/m2 Overweight Increased incidence by 20%  30-34.9 kg/m2 Obese (Class I) Increased incidence by 68%  35-39.9 kg/m2 Severe obesity (Class II) Increased incidence by 136%  >40 kg/m2 Extreme obesity (Class III) Increased incidence by 254%   BMI Readings from Last 4 Encounters:  05/02/17 31.49 kg/m  04/02/17 31.71 kg/m  03/22/17 32.57 kg/m  02/22/17 33.00 kg/m   Wt Readings from Last 4 Encounters:  05/02/17 219 lb 8 oz (99.6 kg)  04/02/17 221 lb (100.2 kg)  03/22/17 227 lb (103 kg)  02/22/17 230 lb (104.3 kg)

## 2017-05-02 NOTE — Patient Instructions (Signed)
____________________________________________________________________________________________  Medication Rules  Applies to: All patients receiving prescriptions (written or electronic).  Pharmacy of record: Pharmacy where electronic prescriptions will be sent. If written prescriptions are taken to a different pharmacy, please inform the nursing staff. The pharmacy listed in the electronic medical record should be the one where you would like electronic prescriptions to be sent.  Prescription refills: Only during scheduled appointments. Applies to both, written and electronic prescriptions.  NOTE: The following applies primarily to controlled substances (Opioid* Pain Medications).   Patient's responsibilities: 1. Pain Pills: Bring all pain pills to every appointment (except for procedure appointments). 2. Pill Bottles: Bring pills in original pharmacy bottle. Always bring newest bottle. Bring bottle, even if empty. 3. Medication refills: You are responsible for knowing and keeping track of what medications you need refilled. The day before your appointment, write a list of all prescriptions that need to be refilled. Bring that list to your appointment and give it to the admitting nurse. Prescriptions will be written only during appointments. If you forget a medication, it will not be "Called in", "Faxed", or "electronically sent". You will need to get another appointment to get these prescribed. 4. Prescription Accuracy: You are responsible for carefully inspecting your prescriptions before leaving our office. Have the discharge nurse carefully go over each prescription with you, before taking them home. Make sure that your name is accurately spelled, that your address is correct. Check the name and dose of your medication to make sure it is accurate. Check the number of pills, and the written instructions to make sure they are clear and accurate. Make sure that you are given enough medication to  last until your next medication refill appointment. 5. Taking Medication: Take medication as prescribed. Never take more pills than instructed. Never take medication more frequently than prescribed. Taking less pills or less frequently is permitted and encouraged, when it comes to controlled substances (written prescriptions).  6. Inform other Doctors: Always inform, all of your healthcare providers, of all the medications you take. 7. Pain Medication from other Providers: You are not allowed to accept any additional pain medication from any other Doctor or Healthcare provider. There are two exceptions to this rule. (see below) In the event that you require additional pain medication, you are responsible for notifying us, as stated below. 8. Medication Agreement: You are responsible for carefully reading and following our Medication Agreement. This must be signed before receiving any prescriptions from our practice. Safely store a copy of your signed Agreement. Violations to the Agreement will result in no further prescriptions. (Additional copies of our Medication Agreement are available upon request.) 9. Laws, Rules, & Regulations: All patients are expected to follow all Federal and State Laws, Statutes, Rules, & Regulations. Ignorance of the Laws does not constitute a valid excuse. The use of any illegal substances is prohibited. 10. Adopted CDC guidelines & recommendations: Target dosing levels will be at or below 60 MME/day. Use of benzodiazepines** is not recommended.  Exceptions: There are only two exceptions to the rule of not receiving pain medications from other Healthcare Providers. 1. Exception #1 (Emergencies): In the event of an emergency (i.e.: accident requiring emergency care), you are allowed to receive additional pain medication. However, you are responsible for: As soon as you are able, call our office [phone], at any time of the day or night, and leave a message stating your  name, the date and nature of the emergency, and the name and dose of the medication   prescribed. In the event that your call is answered by a member of our staff, make sure to document and save the date, time, and the name of the person that took your information.  2. Exception #2 (Planned Surgery): In the event that you are scheduled by another doctor or dentist to have any type of surgery or procedure, you are allowed (for a period no longer than 30 days), to receive additional pain medication, for the acute post-op pain. However, in this case, you are responsible for picking up a copy of our "Post-op Pain Management for Surgeons" handout, and giving it to your surgeon or dentist. This document is available at our office, and does not require an appointment to obtain it. Simply go to our office during business hours (Monday-Thursday from 8:00 AM to 4:00 PM) (Friday 8:00 AM to 12:00 Noon) or if you have a scheduled appointment with [place], prior to your surgery, and ask for it by name. In addition, you will need to provide [place] with your name, name of your surgeon, type of surgery, and date of procedure or surgery.  *Opioid medications include: morphine, codeine, oxycodone, oxymorphone, hydrocodone, hydromorphone, meperidine, tramadol, tapentadol, buprenorphine, fentanyl, methadone. **Benzodiazepine medications include: diazepam (Valium), alprazolam (Xanax), clonazepam (Klonopine), lorazepam (Ativan), clorazepate (Tranxene), chlordiazepoxide (Librium), estazolam (Prosom), oxazepam (Serax), temazepam (Restoril), triazolam (Halcion)  ____________________________________________________________________________________________  BMI Assessment: Estimated body mass index is 31.49 kg/m as calculated from the following:   Height as of this encounter: 5\' 10"  (1.778 m).   Weight as of this encounter: 219 lb 8 oz (99.6 kg).  BMI interpretation table: BMI level Category Range association with higher incidence of chronic  pain  <18 kg/m2 Underweight   18.5-24.9 kg/m2 Ideal body weight   25-29.9 kg/m2 Overweight Increased incidence by 20%  30-34.9 kg/m2 Obese (Class I) Increased incidence by 68%  35-39.9 kg/m2 Severe obesity (Class II) Increased incidence by 136%  >40 kg/m2 Extreme obesity (Class III) Increased incidence by 254%   BMI Readings from Last 4 Encounters:  05/02/17 31.49 kg/m  04/02/17 31.71 kg/m  03/22/17 32.57 kg/m  02/22/17 33.00 kg/m   Wt Readings from Last 4 Encounters:  05/02/17 219 lb 8 oz (99.6 kg)  04/02/17 221 lb (100.2 kg)  03/22/17 227 lb (103 kg)  02/22/17 230 lb (104.3 kg)

## 2017-05-02 NOTE — Progress Notes (Signed)
Nursing Pain Medication Assessment:  Safety precautions to be maintained throughout the outpatient stay will include: orient to surroundings, keep bed in low position, maintain call bell within reach at all times, provide assistance with transfer out of bed and ambulation.  Medication Inspection Compliance: Pill count conducted under aseptic conditions, in front of the patient. Neither the pills nor the bottle was removed from the patient's sight at any time. Once count was completed pills were immediately returned to the patient in their original bottle.  Medication: Oxycodone IR Pill/Patch Count: 13 of 90 pills remain Pill/Patch Appearance: Markings consistent with prescribed medication Bottle Appearance: Standard pharmacy container. Clearly labeled. Filled Date: 10 / 02 / 2018 Last Medication intake:  Today

## 2017-05-09 LAB — TOXASSURE SELECT 13 (MW), URINE

## 2017-05-27 ENCOUNTER — Other Ambulatory Visit: Payer: Self-pay | Admitting: Cardiology

## 2017-05-27 ENCOUNTER — Other Ambulatory Visit: Payer: Self-pay | Admitting: Internal Medicine

## 2017-06-04 DIAGNOSIS — J209 Acute bronchitis, unspecified: Secondary | ICD-10-CM | POA: Diagnosis not present

## 2017-06-26 ENCOUNTER — Other Ambulatory Visit: Payer: Self-pay | Admitting: Internal Medicine

## 2017-06-28 ENCOUNTER — Other Ambulatory Visit: Payer: Self-pay | Admitting: Internal Medicine

## 2017-07-03 ENCOUNTER — Ambulatory Visit: Payer: Medicare Other | Admitting: Internal Medicine

## 2017-07-04 ENCOUNTER — Other Ambulatory Visit: Payer: Self-pay

## 2017-07-04 MED ORDER — PAIN MANAGEMENT IT PUMP REFILL: 1.0000 | Freq: Once | INTRATHECAL | 0 refills | Status: DC

## 2017-07-31 ENCOUNTER — Encounter: Payer: Self-pay | Admitting: Nurse Practitioner

## 2017-07-31 ENCOUNTER — Ambulatory Visit: Payer: Medicare Other | Admitting: Pain Medicine

## 2017-07-31 ENCOUNTER — Other Ambulatory Visit: Payer: Self-pay

## 2017-07-31 ENCOUNTER — Ambulatory Visit: Payer: Medicare Other | Attending: Pain Medicine | Admitting: Nurse Practitioner

## 2017-07-31 VITALS — BP 107/54 | HR 79 | Temp 98.6°F | Resp 16 | Ht 70.0 in | Wt 228.0 lb

## 2017-07-31 DIAGNOSIS — M546 Pain in thoracic spine: Secondary | ICD-10-CM | POA: Diagnosis not present

## 2017-07-31 DIAGNOSIS — Z79899 Other long term (current) drug therapy: Secondary | ICD-10-CM | POA: Insufficient documentation

## 2017-07-31 DIAGNOSIS — Z9889 Other specified postprocedural states: Secondary | ICD-10-CM | POA: Insufficient documentation

## 2017-07-31 DIAGNOSIS — M150 Primary generalized (osteo)arthritis: Secondary | ICD-10-CM | POA: Diagnosis not present

## 2017-07-31 DIAGNOSIS — G894 Chronic pain syndrome: Secondary | ICD-10-CM | POA: Diagnosis not present

## 2017-07-31 DIAGNOSIS — M5441 Lumbago with sciatica, right side: Secondary | ICD-10-CM

## 2017-07-31 DIAGNOSIS — M159 Polyosteoarthritis, unspecified: Secondary | ICD-10-CM | POA: Insufficient documentation

## 2017-07-31 DIAGNOSIS — M542 Cervicalgia: Secondary | ICD-10-CM

## 2017-07-31 DIAGNOSIS — G8929 Other chronic pain: Secondary | ICD-10-CM | POA: Diagnosis not present

## 2017-07-31 DIAGNOSIS — Z955 Presence of coronary angioplasty implant and graft: Secondary | ICD-10-CM | POA: Diagnosis not present

## 2017-07-31 DIAGNOSIS — M7918 Myalgia, other site: Secondary | ICD-10-CM | POA: Diagnosis not present

## 2017-07-31 DIAGNOSIS — Z9689 Presence of other specified functional implants: Secondary | ICD-10-CM | POA: Diagnosis not present

## 2017-07-31 DIAGNOSIS — Z7982 Long term (current) use of aspirin: Secondary | ICD-10-CM | POA: Insufficient documentation

## 2017-07-31 DIAGNOSIS — M25561 Pain in right knee: Secondary | ICD-10-CM | POA: Diagnosis not present

## 2017-07-31 DIAGNOSIS — M797 Fibromyalgia: Secondary | ICD-10-CM | POA: Diagnosis not present

## 2017-07-31 DIAGNOSIS — Z978 Presence of other specified devices: Secondary | ICD-10-CM

## 2017-07-31 DIAGNOSIS — M8949 Other hypertrophic osteoarthropathy, multiple sites: Secondary | ICD-10-CM

## 2017-07-31 MED ORDER — OXYCODONE HCL 5 MG PO TABS: 5.0000 mg | ORAL_TABLET | Freq: Three times a day (TID) | ORAL | 0 refills | Status: DC | PRN

## 2017-07-31 MED ORDER — PREGABALIN 100 MG PO CAPS: 100.0000 mg | ORAL_CAPSULE | Freq: Three times a day (TID) | ORAL | 0 refills | Status: DC

## 2017-07-31 MED ORDER — MELOXICAM 15 MG PO TABS: 15.0000 mg | ORAL_TABLET | Freq: Every day | ORAL | 0 refills | Status: DC

## 2017-07-31 MED ORDER — TIZANIDINE HCL 4 MG PO CAPS: 4.0000 mg | ORAL_CAPSULE | Freq: Three times a day (TID) | ORAL | 0 refills | Status: DC

## 2017-07-31 MED FILL — Medication: INTRATHECAL | Qty: 1 | Status: AC

## 2017-07-31 NOTE — Progress Notes (Signed)
Nursing Pain Medication Assessment:  Safety precautions to be maintained throughout the outpatient stay will include: orient to surroundings, keep bed in low position, maintain call bell within reach at all times, provide assistance with transfer out of bed and ambulation.  Medication Inspection Compliance: Pill count conducted under aseptic conditions, in front of the patient. Neither the pills nor the bottle was removed from the patient's sight at any time. Once count was completed pills were immediately returned to the patient in their original bottle.  Medication: Oxycodone IR Pill/Patch Count: 14  of 90 pills remain Pill/Patch Appearance: Markings consistent with prescribed medication Bottle Appearance: Standard pharmacy container. Clearly labeled. Filled Date: 12/28 / 2018 Last Medication intake:  Today

## 2017-07-31 NOTE — Progress Notes (Signed)
Intrathecal pump filled under sterile technique witnessed by K tice RN with no difficulty.   8 ml residual removed and wasted in sink.  Instructed of s/s of opiod overdose.

## 2017-07-31 NOTE — Progress Notes (Signed)
Patient's Name: Melinda [name]  MRN: [id number]  Referring Provider: Jearld Fenton, NP  DOB: [birth year]  PCP: Jearld Fenton, NP  DOS: 07/31/2017  Note by: Dionisio David, NP  Service setting: Ambulatory outpatient  Specialty: Interventional Pain Management  Patient type: Established  Location: [facility] (AMB) Pain Management Facility  Visit type: Interventional Procedure   Primary Reason for Visit: Interventional Pain Management Treatment. CC: Knee Pain (right) and Back Pain (middle)  Procedure:  Intrathecal Drug Delivery System (IDDS):  Type: Reservoir Refill [phone])       Region: Abdominal Laterality: Right  Type of Pump: Medtronic Synchromed II (MRI-compatible) Delivery Route: Intrathecal Type of Pain Treated: Neuropathic/Nociceptive Primary Medication Class: Opioid/opiate  Medication, Concentration, Infusion Program, & Delivery Rate: Please see scanned programming printout.  Indications: 1. Chronic low back pain (Bilateral) (L>R)   2. Chronic neck pain   3. Presence of intrathecal pump (Medtronic intrathecal programmable pump) (40 mL pump)   4. Fibromyalgia   5. Chronic pain syndrome   6. Primary osteoarthritis involving multiple joints   7. Musculoskeletal pain    Pain Assessment: Self-Reported Pain Score: 4 /10             Reported level is compatible with observation.        Intrathecal Pump Therapy Assessment  Manufacturer: Medtronic Synchromed II Type: Programmable Volume: 40 mL reservoir MRI compatibility: Yes   Drug content:  Primary Medication Class: Opioid Primary Medication: PF-Fentanyl Secondary Medication: see pump readout Other Medication: see pump readout   Programming:  Type: Simple continuous. See pump readout for details.   Changes:  Medication Change: None at this point Rate Change: No change in rate  Reported side-effects or adverse reactions: None reported  Effectiveness: Described as relatively effective, allowing for increase in activities of  daily living (ADL) Clinically meaningful improvement in function (CMIF): Sustained CMIF goals met  Plan: Pump refill today  Pre-op Assessment:  Melinda [name] is a 62 y.o. (year old), female patient, seen today for interventional treatment. 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 [name] has a current medication list which includes the following prescription(s): accu-chek softclix lancets, AMBULATORY NON FORMULARY MEDICATION, aspirin, bupropion, vitamin d3, chromium picolinate, cinnamon bark, co q 10, glucosamine-chondroitin, glucose blood, glucose blood, grape seed, ibuprofen, magnesium malate, meloxicam, multivitamin, naloxone, nitroglycerin, fish oil, paroxetine, polyethylene glycol, pregabalin, rosuvastatin, tizanidine, vitamin e, oxycodone, oxycodone, oxycodone, and PAIN MANAGEMENT IT PUMP REFILL. Her primarily concern today is the Knee Pain (right) and Back Pain (middle)  Initial Vital Signs:  Pulse Rate: 79 Temp: 98.6 F (37 C) ECG Heart Rate:   Resp: 16 BP: (!) 107/54 SpO2: 98 % ETCO2:    BMI: Estimated body mass index is 32.71 kg/m as calculated from the following:   Height as of this encounter: _0  (1.778 m).   Weight as of this encounter: 228 lb (103.4 kg).  Risk Assessment: Allergies: Reviewed. She has No Known Allergies.  Allergy Precautions: None required Coagulopathies: Reviewed. None identified.  Blood-thinner therapy: None at this time Active Infection(s): Reviewed. None identified.  Melinda [name] is afebrile  Site Confirmation: Melinda [name] was asked to confirm the  procedure and laterality before marking the site Procedure checklist: Completed Consent: Before the procedure and under the influence of no sedative(s), amnesic(s), or anxiolytics, the patient was informed of the treatment options, risks and possible complications. To fulfill our ethical and legal obligations, as recommended by the American Medical Association's Code of Ethics, I have informed the patient of my clinical impression; the nature and purpose of the treatment or procedure; the risks, benefits, and possible complications of the intervention; the alternatives, including doing nothing; the risk(s) and benefit(s) of the alternative treatment(s) or procedure(s); and the risk(s) and benefit(s) of doing nothing.  [name] was provided with information about the general risks and possible complications associated with most interventional procedures. These include, but are not limited to: failure to achieve desired goals, infection, bleeding, organ or nerve damage, allergic reactions, paralysis, and/or death.  In addition, she was informed of those risks and possible complications associated to this particular procedure, which include, but are not limited to: damage to the implant; failure to decrease pain; local, systemic, or serious CNS infections, intraspinal abscess with possible cord compression and paralysis, or life-threatening such as meningitis; bleeding; organ damage; nerve injury or damage with subsequent sensory, motor, and/or autonomic system dysfunction, resulting in transient or permanent pain, numbness, and/or weakness of one or several areas of the body; allergic reactions, either minor or major life-threatening, such as anaphylactic or anaphylactoid reactions.  Furthermore, [name] was informed of those risks and complications associated with the medications. These include, but are not limited to:  allergic reactions (i.e.: anaphylactic or anaphylactoid reactions); endorphine suppression; bradycardia and/or hypotension; water retention and/or peripheral vascular relaxation leading to lower extremity edema and possible stasis ulcers; respiratory depression and/or shortness of breath; decreased metabolic rate leading to weight gain; swelling or edema; medication-induced neural toxicity; particulate matter embolism and blood vessel occlusion with resultant organ, and/or nervous system infarction; and/or intrathecal granuloma formation with possible spinal cord compression and permanent paralysis.  Before refilling the pump Ms. Tonkovich was informed that some of the medications used in the devise may not be FDA approved for such use and therefore it constitutes an off-label use of the medications.  Finally, she was informed that Medicine is not an exact science; therefore, there is also the possibility of unforeseen or unpredictable risks and/or possible complications that may result in a catastrophic outcome. The patient indicated having understood very clearly. We have given the patient no guarantees and we have made no promises. Enough time was given to the patient to ask questions, all of which were answered to the patient's satisfaction. Ms. Otterson has indicated that she wanted to continue with the procedure. Attestation: I, the ordering provider, attest that I have discussed with the patient the benefits, risks, side-effects, alternatives, likelihood of achieving goals, and potential problems during recovery for the procedure that I have provided informed consent. Date: 07/31/2017; Time: 11:22 AM  Pre-Procedure Preparation:  Monitoring: As per clinic protocol. Respiration, ETCO2, SpO2, BP, heart rate and rhythm monitor placed and checked for adequate function Safety Precautions: Patient was assessed for positional comfort and pressure points before starting the procedure. Time-out: I initiated and  conducted the "Time-out" before starting the procedure, as per protocol. The patient was asked to participate by confirming the accuracy of the "Time Out" information. Verification of the correct person, site, and procedure were performed and confirmed by me, the nursing staff, and the patient. "Time-out" conducted as per Joint Commission's Universal Protocol (UP.01.01.01). "Time-out" Date & Time: 07/31/2017;  1130 hrs.  Description of Procedure Process:   Position: Supine Target Area: Central-port of intrathecal pump. Approach: Anterior, 90 degree angle approach. Area Prepped: Entire Area around the pump implant. Prepping solution: ChloraPrep (2% chlorhexidine gluconate and 70% isopropyl alcohol) Safety Precautions: Aspiration looking for blood return was conducted prior to all injections. At no point did we inject any substances, as a needle was being advanced. No attempts were made at seeking any paresthesias. Safe injection practices and needle disposal techniques used. Medications properly checked for expiration dates. SDV (single dose vial) medications used. Description of the Procedure: Protocol guidelines were followed. Two nurses trained to do implant refills were present during the entire procedure. The refill medication was checked by both healthcare providers as well as the patient. The patient was included in the "Time-out" to verify the medication. The patient was placed in position. The pump was identified. The area was prepped in the usual manner. The sterile template was positioned over the pump, making sure the side-port location matched that of the pump. Both, the pump and the template were held for stability. The needle provided in the Medtronic Kit was then introduced thru the center of the template and into the central port. The pump content was aspirated and discarded volume documented. The new medication was slowly infused into the pump, thru the filter, making sure to avoid  overpressure of the device. The needle was then removed and the area cleansed, making sure to leave some of the prepping solution back to take advantage of its long term bactericidal properties. The pump was interrogated and programmed to reflect the correct medication, volume, and dosage. The program was printed and taken to the physician for approval. Once checked and signed by the physician, a copy was provided to the patient and another scanned into the EMR. Vitals:   07/31/17 1102  BP: (!) 107/54  Pulse: 79  Resp: 16  Temp: 98.6 F (37 C)  TempSrc: Oral  SpO2: 98%  Weight: 228 lb (103.4 kg)  Height: _0  (1.778 m)    Start Time: 1130 hrs. End Time: 1145 hrs. Materials & Medications: Medtronic Refill Kit Medication(s): Please see chart orders for details.  Imaging Guidance:  Type of Imaging Technique: None used Indication(s): N/A Exposure Time: No patient exposure Contrast: None used. Fluoroscopic Guidance: N/A Ultrasound Guidance: N/A Interpretation: N/A  Antibiotic Prophylaxis:  Indication(s): None identified Antibiotic given: None  Post-operative Assessment:  Post-procedure Vital Signs:  Pulse Rate: 79 Temp: 98.6 F (37 C) ECG Heart Rate:   Resp: 16 BP: (!) 107/54 SpO2: 98 % ETCO2:   EBL: None Complications: No immediate post-treatment complications observed by team, or reported by patient. Note: The patient tolerated the entire procedure well. A repeat set of vitals were taken after the procedure and the patient was kept under observation following institutional policy, for this type of procedure. Post-procedural neurological assessment was performed, showing return to baseline, prior to discharge. The patient was provided with post-procedure discharge instructions, including a section on how to identify potential problems. Should any problems arise concerning this procedure, the patient was given instructions to immediately contact us, at any time, without  hesitation. In any case, we plan to contact the patient by telephone for a follow-up status report regarding this interventional procedure. Comments:  No additional relevant information.  Controlled Substance Pharmacotherapy Assessment REMS (Risk Evaluation and Mitigation Strategy)  Analgesic: Oxycodone IR 5 mg every 8 hours (15 mg/day), in addition to the intrathecal pump medication.  MME/day:22  mg/day of oral medication   Janett Billow, RN  07/31/2017 11:46 AM  Sign at close encounter Intrathecal pump filled under sterile technique witnessed by K tice RN with no difficulty.   8 ml residual removed and wasted in sink.  Instructed of s/s of opiod overdose.   Landis Martins, RN  07/31/2017 11:17 AM  Sign at close encounter Nursing Pain Medication Assessment:  Safety precautions to be maintained throughout the outpatient stay will include: orient to surroundings, keep bed in low position, maintain call bell within reach at all times, provide assistance with transfer out of bed and ambulation.  Medication Inspection Compliance: Pill count conducted under aseptic conditions, in front of the patient. Neither the pills nor the bottle was removed from the patient's sight at any time. Once count was completed pills were immediately returned to the patient in their original bottle.  Medication: Oxycodone IR Pill/Patch Count: 14  of 90 pills remain Pill/Patch Appearance: Markings consistent with prescribed medication Bottle Appearance: Standard pharmacy container. Clearly labeled. Filled Date: 12/28 / 2018 Last Medication intake:  Today   Pharmacokinetics: Liberation and absorption (onset of action): WNL Distribution (time to peak effect): WNL Metabolism and excretion (duration of action): WNL         Pharmacodynamics: Desired effects: Analgesia: Ms. Eccleston reports >50% benefit. Functional ability: Patient reports that medication allows her to accomplish basic ADLs Clinically  meaningful improvement in function (CMIF): Sustained CMIF goals met Perceived effectiveness: Described as relatively effective, allowing for increase in activities of daily living (ADL) Undesirable effects: Side-effects or Adverse reactions: None reported Monitoring: [place] PMP: Online review of the past 55-monthperiod conducted. Compliant with practice rules and regulations Last UDS on record: Summary  Date Value Ref Range Status  05/02/2017 FINAL  Final    Comment:    ==================================================================== TOXASSURE SELECT 13 (MW) ==================================================================== Test                             Result       Flag       Units Drug Present and Declared for Prescription Verification   Oxycodone                      642          EXPECTED   ng/mg creat   Oxymorphone                    119          EXPECTED   ng/mg creat   Noroxycodone                   1800         EXPECTED   ng/mg creat    Sources of oxycodone include scheduled prescription medications.    Oxymorphone and noroxycodone are expected metabolites of    oxycodone. Oxymorphone is also available as a scheduled    prescription medication. Drug Present not Declared for Prescription Verification   Fentanyl                       9            UNEXPECTED ng/mg creat   Norfentanyl                    24           UNEXPECTED ng/mg creat  Source of fentanyl is a scheduled prescription medication,    including IV, patch, and transmucosal formulations. Norfentanyl    is an expected metabolite of fentanyl. ==================================================================== Test                      Result    Flag   Units      Ref Range   Creatinine              103              mg/dL      >=20 ==================================================================== Declared Medications:  The flagging and interpretation on this report are based on the  following declared  medications.  Unexpected results may arise from  inaccuracies in the declared medications.  **Note: The testing scope of this panel includes these medications:  Oxycodone  **Note: The testing scope of this panel does not include following  reported medications:  Acetaminophen  Aspirin (Aspirin 81)  Bupropion  Cholecalciferol  Chondroitin (Glucosamine-Chondroitin)  Chromium  Glucosamine (Glucosamine-Chondroitin)  Ibuprofen  Magnesium  Meloxicam  Multivitamin  Naloxone  Nitroglycerin  Omega-3 Fatty Acids  Paroxetine  Polyethylene Glycol  Pregabalin  Rosuvastatin  Supplement  Tizanidine  Ubiquinone (Coenzyme Q 10)  Vitamin E ==================================================================== For clinical consultation, please call [phone]. ====================================================================    UDS interpretation: Compliant          Medication Assessment Form: Reviewed. Patient indicates being compliant with therapy Treatment compliance: Compliant Risk Assessment Profile: Aberrant behavior: See prior evaluations. None observed or detected today Comorbid factors increasing risk of overdose: See prior notes. No additional risks detected today Risk of substance use disorder (SUD): Low  ORT Scoring interpretation table:  Score <3 = Low Risk for SUD  Score between 4-7 = Moderate Risk for SUD  Score >8 = High Risk for Opioid Abuse   Risk Mitigation Strategies:  Patient Counseling: Covered Patient-Prescriber Agreement (PPA): Present and active  Notification to other healthcare providers: Done  Pharmacologic Plan: No change in therapy, at this time.             Assessment  Primary Diagnosis & Pertinent Problem List: The primary encounter diagnosis was Chronic low back pain (Bilateral) (L>R). Diagnoses of Chronic neck pain, Presence of intrathecal pump (Medtronic intrathecal programmable pump) (40 mL pump), Fibromyalgia, Chronic pain syndrome, Primary  osteoarthritis involving multiple joints, and Musculoskeletal pain were also pertinent to this visit.  Status Diagnosis  Controlled Controlled Controlled 1. Chronic low back pain (Bilateral) (L>R)   2. Chronic neck pain   3. Presence of intrathecal pump (Medtronic intrathecal programmable pump) (40 mL pump)   4. Fibromyalgia   5. Chronic pain syndrome   6. Primary osteoarthritis involving multiple joints   7. Musculoskeletal pain     Problems updated and reviewed during this visit: No problems updated. Plan of Care   Imaging Orders  No imaging studies ordered today   Procedure Orders    No procedure(s) ordered today    Medications ordered for procedure: Meds ordered this encounter  Medications  . oxyCODONE (OXY IR/ROXICODONE) 5 MG immediate release tablet    Sig: Take 1 tablet (5 mg total) by mouth every 8 (eight) hours as needed for severe pain.    Dispense:  90 tablet    Refill:  0    Do not place this medication, or any other prescription from our practice, on "Automatic Refill". Patient may have prescription filled one day early if pharmacy is closed on  scheduled refill date. Do not fill until:10/02/2017 To last until:11/01/2017    Order Specific Question:   Supervising Provider    Answer:   [name] [phone]  . oxyCODONE (OXY IR/ROXICODONE) 5 MG immediate release tablet    Sig: Take 1 tablet (5 mg total) by mouth every 8 (eight) hours as needed for severe pain.    Dispense:  90 tablet    Refill:  0    Do not place this medication, or any other prescription from our practice, on "Automatic Refill". Patient may have prescription filled one day early if pharmacy is closed on scheduled refill date. Do not fill until:09/02/2017 To last until: 10/02/2017    Order Specific Question:   Supervising Provider    Answer:   [name] [phone]  . oxyCODONE (OXY IR/ROXICODONE) 5 MG immediate release tablet    Sig: Take 1 tablet (5 mg total) by mouth 3 (three) times  daily as needed for severe pain.    Dispense:  90 tablet    Refill:  0    Do not place this medication, or any other prescription from our practice, on "Automatic Refill". Patient may have prescription filled one day early if pharmacy is closed on scheduled refill date. Do not fill until: 08/03/2017 To last until:09/02/2017    Order Specific Question:   Supervising Provider    Answer:   [name] [phone]  . pregabalin (LYRICA) 100 MG capsule    Sig: Take 1 capsule (100 mg total) by mouth 3 (three) times daily.    Dispense:  270 capsule    Refill:  0    Do not place this medication, or any other prescription from our practice, on "Automatic Refill". Patient may have prescription filled one day early if pharmacy is closed on scheduled refill date.    Order Specific Question:   Supervising Provider    Answer:   [name] [phone]  . tiZANidine (ZANAFLEX) 4 MG capsule    Sig: Take 1 capsule (4 mg total) by mouth 3 (three) times daily.    Dispense:  270 capsule    Refill:  0    Do not add this medication to the electronic "Automatic Refill" notification system. Patient may have prescription filled one day early if pharmacy is closed on scheduled refill date.    Order Specific Question:   Supervising Provider    Answer:   [name] [phone]  . meloxicam (MOBIC) 15 MG tablet    Sig: Take 1 tablet (15 mg total) by mouth daily.    Dispense:  90 tablet    Refill:  0    Do not place medication on "Automatic Refill". Fill one day early if pharmacy is closed on scheduled refill date.    Order Specific Question:   Supervising Provider    Answer:   [name] [phone]   Medications administered: Darcella C. Derstine had no medications administered during this visit.  See the medical record for exact dosing, route, and time of administration.  New Prescriptions   No medications on file   Disposition: Discharge home  Discharge Date & Time: 07/31/2017; 1200 hrs.    Physician-requested Follow-up: Return in about 3 months (around 10/29/2017) for MedMgmt with Me Donella Stade Edison Pace), Pump refill based on programming.  Future Appointments  Date Time Provider Cool  08/06/2017  2:15 PM Jearld Fenton, NP [facility]  10/30/2017  1:30 PM Vevelyn Francois, NP [facility] None  11/26/2017  1:45 PM Vevelyn Francois,  NP [facility] None   Primary Care Physician: Jearld Fenton, NP Location: [facility] Note by: Dionisio David, NP Date: 07/31/2017; Time: 2:13 PM  Disclaimer:  Medicine is not an exact science. The only guarantee in medicine is that nothing is guaranteed. It is important to note that the decision to proceed with this intervention was based on the information collected from the patient. The Data and conclusions were drawn from the patient's questionnaire, the interview, and the physical examination. Because the information was provided in large part by the patient, it cannot be guaranteed that it has not been purposely or unconsciously manipulated. Every effort has been made to obtain as much relevant data as possible for this evaluation. It is important to note that the conclusions that lead to this procedure are derived in large part from the available data. Always take into account that the treatment will also be dependent on availability of resources and existing treatment guidelines, considered by other Pain Management Practitioners as being common knowledge and practice, at the time of the intervention. For Medico-Legal purposes, it is also important to point out that variation in procedural techniques and pharmacological choices are the acceptable norm. The indications, contraindications, technique, and results of the above procedure should only be interpreted and judged by a Board-Certified Interventional Pain Specialist with extensive familiarity and expertise in the same exact procedure and technique.

## 2017-07-31 NOTE — Patient Instructions (Addendum)
____________________________________________________________________________________________  Medication Rules  Applies to: All patients receiving prescriptions (written or electronic).  Pharmacy of record: Pharmacy where electronic prescriptions will be sent. If written prescriptions are taken to a different pharmacy, please inform the nursing staff. The pharmacy listed in the electronic medical record should be the one where you would like electronic prescriptions to be sent.  Prescription refills: Only during scheduled appointments. Applies to both, written and electronic prescriptions.  NOTE: The following applies primarily to controlled substances (Opioid* Pain Medications).   Patient's responsibilities: 1. Pain Pills: Bring all pain pills to every appointment (except for procedure appointments). 2. Pill Bottles: Bring pills in original pharmacy bottle. Always bring newest bottle. Bring bottle, even if empty. 3. Medication refills: You are responsible for knowing and keeping track of what medications you need refilled. The day before your appointment, write a list of all prescriptions that need to be refilled. Bring that list to your appointment and give it to the admitting nurse. Prescriptions will be written only during appointments. If you forget a medication, it will not be "Called in", "Faxed", or "electronically sent". You will need to get another appointment to get these prescribed. 4. Prescription Accuracy: You are responsible for carefully inspecting your prescriptions before leaving our office. Have the discharge nurse carefully go over each prescription with you, before taking them home. Make sure that your name is accurately spelled, that your address is correct. Check the name and dose of your medication to make sure it is accurate. Check the number of pills, and the written instructions to make sure they are clear and accurate. Make sure that you are given enough medication to  last until your next medication refill appointment. 5. Taking Medication: Take medication as prescribed. Never take more pills than instructed. Never take medication more frequently than prescribed. Taking less pills or less frequently is permitted and encouraged, when it comes to controlled substances (written prescriptions).  6. Inform other Doctors: Always inform, all of your healthcare providers, of all the medications you take. 7. Pain Medication from other Providers: You are not allowed to accept any additional pain medication from any other Doctor or Healthcare provider. There are two exceptions to this rule. (see below) In the event that you require additional pain medication, you are responsible for notifying us, as stated below. 8. Medication Agreement: You are responsible for carefully reading and following our Medication Agreement. This must be signed before receiving any prescriptions from our practice. Safely store a copy of your signed Agreement. Violations to the Agreement will result in no further prescriptions. (Additional copies of our Medication Agreement are available upon request.) 9. Laws, Rules, & Regulations: All patients are expected to follow all Federal and State Laws, Statutes, Rules, & Regulations. Ignorance of the Laws does not constitute a valid excuse. The use of any illegal substances is prohibited. 10. Adopted CDC guidelines & recommendations: Target dosing levels will be at or below 60 MME/day. Use of benzodiazepines** is not recommended.  Exceptions: There are only two exceptions to the rule of not receiving pain medications from other Healthcare Providers. 1. Exception #1 (Emergencies): In the event of an emergency (i.e.: accident requiring emergency care), you are allowed to receive additional pain medication. However, you are responsible for: As soon as you are able, call our office [phone], at any time of the day or night, and leave a message stating your  name, the date and nature of the emergency, and the name and dose of the medication   prescribed. In the event that your call is answered by a member of our staff, make sure to document and save the date, time, and the name of the person that took your information.  2. Exception #2 (Planned Surgery): In the event that you are scheduled by another doctor or dentist to have any type of surgery or procedure, you are allowed (for a period no longer than 30 days), to receive additional pain medication, for the acute post-op pain. However, in this case, you are responsible for picking up a copy of our "Post-op Pain Management for Surgeons" handout, and giving it to your surgeon or dentist. This document is available at our office, and does not require an appointment to obtain it. Simply go to our office during business hours (Monday-Thursday from 8:00 AM to 4:00 PM) (Friday 8:00 AM to 12:00 Noon) or if you have a scheduled appointment with [place], prior to your surgery, and ask for it by name. In addition, you will need to provide [place] with your name, name of your surgeon, type of surgery, and date of procedure or surgery.  *Opioid medications include: morphine, codeine, oxycodone, oxymorphone, hydrocodone, hydromorphone, meperidine, tramadol, tapentadol, buprenorphine, fentanyl, methadone. **Benzodiazepine medications include: diazepam (Valium), alprazolam (Xanax), clonazepam (Klonopine), lorazepam (Ativan), clorazepate (Tranxene), chlordiazepoxide (Librium), estazolam (Prosom), oxazepam (Serax), temazepam (Restoril), triazolam (Halcion)  ____________________________________________________________________________________________   Opioid Overdose Opioids are substances that relieve pain by binding to pain receptors in your brain and spinal cord. Opioids include illegal drugs, such as heroin, as well as prescription pain medicines.An opioid overdose happens when you take too much of an opioid substance. This can  happen with any type of opioid, including:  Heroin.  Morphine.  Codeine.  Methadone.  Oxycodone.  Hydrocodone.  Fentanyl.  Hydromorphone.  Buprenorphine.  The effects of an overdose can be mild, dangerous, or even deadly. Opioid overdose is a medical emergency. What are the causes? This condition may be caused by:  Taking too much of an opioid by accident.  Taking too much of an opioid on purpose.  An error made by a health care provider who prescribes a medicine.  An error made by the pharmacist who fills the prescription order.  Using more than one substance that contains opioids at the same time.  Mixing an opioid with a substance that affects your heart, breathing, or blood pressure. These include alcohol, tranquilizers, sleeping pills, illegal drugs, and some over-the-counter medicines.  What increases the risk? This condition is more likely in:  Children. They may be attracted to colorful pills. Because of a child's small size, even a small amount of a drug can be dangerous.  Elderly people. They may be taking many different drugs. Elderly people may have difficulty reading labels or remembering when they last took their medicine.  People who take an opioid on a long-term basis.  People who use: ? Illegal drugs. ? Other substances, including alcohol, while using an opioid.  People who have: ? A history of drug or alcohol abuse. ? Certain mental health conditions.  People who take opioids that are not prescribed for them.  What are the signs or symptoms? Symptoms of this condition depend on the type of opioid and the amount that was taken. Common symptoms include:  Sleepiness or difficulty waking from sleep.  Confusion.  Slurred speech.  Slowed breathing and a slow pulse.  Nausea and vomiting.  Abnormally small pupils.  Signs and symptoms that require emergency treatment include:  Cold, clammy, and pale skin.  Blue lips and  fingernails.  Vomiting.  Gurgling sounds in the throat.  A pulse that is very slow or difficult to detect.  Breathing that is very slow, noisy, or difficult to detect.  Limp body.  Inability to respond to speech or be awakened from sleep (stupor).  How is this diagnosed? This condition is diagnosed based on your symptoms. It is important to tell your health care provider:  All of the opioidsthat you took.  When you took the opioids.  Whether you were drinking alcohol or using other substances.  Your health care provider will do a physical exam. This exam may include:  Checking and monitoring your heart rate and rhythm, your breathing rate and depth, your temperature, and your blood pressure (vital signs).  Checking for abnormally small pupils.  Measuring oxygen levels in your blood.  You may also have blood tests or urine tests. How is this treated? Supporting your vital signs and your breathing is the first step in treating an opioid overdose. Treatment may also include:  Giving fluids and minerals (electrolytes) through an IV tube.  Inserting a breathing tube (endotracheal tube) in your airway to help you breathe.  Giving oxygen.  Passing a tube through your nose and into your stomach (NG tube, or nasogastric tube) to wash out your stomach.  Giving medicines that: ? Increase your blood pressure. ? Absorb any opioid that is in your digestive system. ? Reverse the effects of the opioid (naloxone).  Ongoing counseling and mental health support if you intentionally overdosed or used an illegal drug.  Follow these instructions at home:  Take over-the-counter and prescription medicines only as told by your health care provider. Always ask your health care provider about possible side effects and interactions of any new medicine that you start taking.  Keep a list of all of the medicines that you take, including over-the-counter medicines. Bring this list with you to  all of your medical visits.  Drink enough fluid to keep your urine clear or pale yellow.  Keep all follow-up visits as told by your health care provider. This is important. How is this prevented?  Get help if you are struggling with: ? Alcohol or drug use. ? Depression or another mental health problem.  Keep the phone number of your local poison control center near your phone or on your cell phone.  Store all medicines in safety containers that are out of the reach of children.  Read the drug inserts that come with your medicines.  Do not drink alcohol when taking opioids.  Do not use illegal drugs.  Do not take opioid medicines that are not prescribed for you. Contact a health care provider if:  Your symptoms return.  You develop new symptoms or side effects when you are taking medicines. Get help right away if:  You think that you or someone else may have taken too much of an opioid. The hotline of the [facility] is (254)438-7354.  You or someone else is having symptoms of an opioid overdose.  You have serious thoughts about hurting yourself or others.  You have: ? Chest pain. ? Difficulty breathing. ? A loss of consciousness. Opioid overdose is an emergency. Do not wait to see if the symptoms will go away. Get medical help right away. Call your local emergency services (911 in the [place] Do not drive yourself to the hospital. This information is not intended to replace advice given to you by your health care  provider. Make sure you discuss any questions you have with your health care provider. Document Released: 07/26/2004 Document Revised: 11/24/2015 Document Reviewed: 12/02/2014 Elsevier Interactive Patient Education  [employer].

## 2017-08-06 ENCOUNTER — Ambulatory Visit (INDEPENDENT_AMBULATORY_CARE_PROVIDER_SITE_OTHER): Payer: Medicare Other | Admitting: Internal Medicine

## 2017-08-06 ENCOUNTER — Encounter: Payer: Self-pay | Admitting: Internal Medicine

## 2017-08-06 ENCOUNTER — Other Ambulatory Visit: Payer: Self-pay | Admitting: Internal Medicine

## 2017-08-06 VITALS — BP 140/90 | HR 81 | Temp 98.4°F | Ht 69.0 in | Wt 233.0 lb

## 2017-08-06 DIAGNOSIS — I2583 Coronary atherosclerosis due to lipid rich plaque: Secondary | ICD-10-CM

## 2017-08-06 DIAGNOSIS — R8290 Unspecified abnormal findings in urine: Secondary | ICD-10-CM

## 2017-08-06 DIAGNOSIS — Z1231 Encounter for screening mammogram for malignant neoplasm of breast: Secondary | ICD-10-CM

## 2017-08-06 DIAGNOSIS — N898 Other specified noninflammatory disorders of vagina: Secondary | ICD-10-CM

## 2017-08-06 DIAGNOSIS — M797 Fibromyalgia: Secondary | ICD-10-CM | POA: Diagnosis not present

## 2017-08-06 DIAGNOSIS — E7800 Pure hypercholesterolemia, unspecified: Secondary | ICD-10-CM | POA: Diagnosis not present

## 2017-08-06 DIAGNOSIS — T402X5A Adverse effect of other opioids, initial encounter: Secondary | ICD-10-CM

## 2017-08-06 DIAGNOSIS — K5903 Drug induced constipation: Secondary | ICD-10-CM

## 2017-08-06 DIAGNOSIS — F32A Depression, unspecified: Secondary | ICD-10-CM

## 2017-08-06 DIAGNOSIS — G4733 Obstructive sleep apnea (adult) (pediatric): Secondary | ICD-10-CM

## 2017-08-06 DIAGNOSIS — Z1239 Encounter for other screening for malignant neoplasm of breast: Secondary | ICD-10-CM

## 2017-08-06 DIAGNOSIS — Z23 Encounter for immunization: Secondary | ICD-10-CM | POA: Diagnosis not present

## 2017-08-06 DIAGNOSIS — E11628 Type 2 diabetes mellitus with other skin complications: Secondary | ICD-10-CM

## 2017-08-06 DIAGNOSIS — I158 Other secondary hypertension: Secondary | ICD-10-CM | POA: Diagnosis not present

## 2017-08-06 DIAGNOSIS — I10 Essential (primary) hypertension: Secondary | ICD-10-CM | POA: Diagnosis not present

## 2017-08-06 DIAGNOSIS — Z124 Encounter for screening for malignant neoplasm of cervix: Secondary | ICD-10-CM | POA: Diagnosis not present

## 2017-08-06 DIAGNOSIS — Z0000 Encounter for general adult medical examination without abnormal findings: Secondary | ICD-10-CM

## 2017-08-06 DIAGNOSIS — I2510 Atherosclerotic heart disease of native coronary artery without angina pectoris: Secondary | ICD-10-CM | POA: Diagnosis not present

## 2017-08-06 DIAGNOSIS — F419 Anxiety disorder, unspecified: Secondary | ICD-10-CM

## 2017-08-06 DIAGNOSIS — Z0001 Encounter for general adult medical examination with abnormal findings: Secondary | ICD-10-CM | POA: Diagnosis not present

## 2017-08-06 DIAGNOSIS — F329 Major depressive disorder, single episode, unspecified: Secondary | ICD-10-CM

## 2017-08-06 DIAGNOSIS — E559 Vitamin D deficiency, unspecified: Secondary | ICD-10-CM | POA: Diagnosis not present

## 2017-08-06 LAB — POC URINALSYSI DIPSTICK (AUTOMATED)
Blood, UA: NEGATIVE
Clarity, UA: NEGATIVE
Color, UA: NEGATIVE
Glucose, UA: NEGATIVE
Ketones, UA: NEGATIVE
Leukocytes, UA: NEGATIVE
Nitrite, UA: NEGATIVE
Protein, UA: NEGATIVE
Spec Grav, UA: >=1.03 — AB (ref 1.010–1.025)
Urobilinogen, UA: 0.2 E.U./dL
pH, UA: 6 (ref 5.0–8.0)

## 2017-08-06 MED ORDER — BUPROPION HCL ER (XL) 150 MG PO TB24: 150.0000 mg | ORAL_TABLET | Freq: Every day | ORAL | 3 refills | Status: DC

## 2017-08-06 NOTE — Telephone Encounter (Signed)
Ok to fill per [employer]

## 2017-08-06 NOTE — Telephone Encounter (Signed)
Copied from [place] [id number]. Topic: Quick Communication - See Telephone Encounter >> Aug 06, 2017  4:11 PM Melinda [name] wrote: CRM for notification. See Telephone encounter for:   08/06/17.  Patient said she was just in the office and her buPROPion (WELLBUTRIN XL) 150 MG 24 hr tablet was suppose to be sent [facility] to [facility] [address]. She said it is not there. Please advise.  [facility] [place], [place]

## 2017-08-06 NOTE — Telephone Encounter (Signed)
Patient is requesting her medication refill- she was seen today- but charting is not complete yet.   Bupropion XL 150 mg   LOV: today 08/06/17  Last fill - 06/26/17  Pharmacy: verified

## 2017-08-06 NOTE — Patient Instructions (Signed)
Health Maintenance for Postmenopausal Women Menopause is a normal process in which your reproductive ability comes to an end. This process happens gradually over a span of months to years, usually between the ages of 22 and 9. Menopause is complete when you have missed 12 consecutive menstrual periods. It is important to talk with your health care provider about some of the most common conditions that affect postmenopausal women, such as heart disease, cancer, and bone loss (osteoporosis). Adopting a healthy lifestyle and getting preventive care can help to promote your health and wellness. Those actions can also lower your chances of developing some of these common conditions. What should I know about menopause? During menopause, you may experience a number of symptoms, such as:  Moderate-to-severe hot flashes.  Night sweats.  Decrease in sex drive.  Mood swings.  Headaches.  Tiredness.  Irritability.  Memory problems.  Insomnia.  Choosing to treat or not to treat menopausal changes is an individual decision that you make with your health care provider. What should I know about hormone replacement therapy and supplements? Hormone therapy products are effective for treating symptoms that are associated with menopause, such as hot flashes and night sweats. Hormone replacement carries certain risks, especially as you become older. If you are thinking about using estrogen or estrogen with progestin treatments, discuss the benefits and risks with your health care provider. What should I know about heart disease and stroke? Heart disease, heart attack, and stroke become more likely as you age. This may be due, in part, to the hormonal changes that your body experiences during menopause. These can affect how your body processes dietary fats, triglycerides, and cholesterol. Heart attack and stroke are both medical emergencies. There are many things that you can do to help prevent heart disease  and stroke:  Have your blood pressure checked at least every 1-2 years. High blood pressure causes heart disease and increases the risk of stroke.  If you are 53-22 years old, ask your health care provider if you should take aspirin to prevent a heart attack or a stroke.  Do not use any tobacco products, including cigarettes, chewing tobacco, or electronic cigarettes. If you need help quitting, ask your health care provider.  It is important to eat a healthy diet and maintain a healthy weight. ? Be sure to include plenty of vegetables, fruits, low-fat dairy products, and lean protein. ? Avoid eating foods that are high in solid fats, added sugars, or salt (sodium).  Get regular exercise. This is one of the most important things that you can do for your health. ? Try to exercise for at least 150 minutes each week. The type of exercise that you do should increase your heart rate and make you sweat. This is known as moderate-intensity exercise. ? Try to do strengthening exercises at least twice each week. Do these in addition to the moderate-intensity exercise.  Know your numbers.Ask your health care provider to check your cholesterol and your blood glucose. Continue to have your blood tested as directed by your health care provider.  What should I know about cancer screening? There are several types of cancer. Take the following steps to reduce your risk and to catch any cancer development as early as possible. Breast Cancer  Practice breast self-awareness. ? This means understanding how your breasts normally appear and feel. ? It also means doing regular breast self-exams. Let your health care provider know about any changes, no matter how small.  If you are 40  or older, have a clinician do a breast exam (clinical breast exam or CBE) every year. Depending on your age, family history, and medical history, it may be recommended that you also have a yearly breast X-ray (mammogram).  If you  have a family history of breast cancer, talk with your health care provider about genetic screening.  If you are at high risk for breast cancer, talk with your health care provider about having an MRI and a mammogram every year.  Breast cancer (BRCA) gene test is recommended for women who have family members with BRCA-related cancers. Results of the assessment will determine the need for genetic counseling and BRCA1 and for BRCA2 testing. BRCA-related cancers include these types: ? Breast. This occurs in males or females. ? Ovarian. ? Tubal. This may also be called fallopian tube cancer. ? Cancer of the abdominal or pelvic lining (peritoneal cancer). ? Prostate. ? Pancreatic.  Cervical, Uterine, and Ovarian Cancer Your health care provider may recommend that you be screened regularly for cancer of the pelvic organs. These include your ovaries, uterus, and vagina. This screening involves a pelvic exam, which includes checking for microscopic changes to the surface of your cervix (Pap test).  For women ages 21-65, health care providers may recommend a pelvic exam and a Pap test every three years. For women ages 79-65, they may recommend the Pap test and pelvic exam, combined with testing for human papilloma virus (HPV), every five years. Some types of HPV increase your risk of cervical cancer. Testing for HPV may also be done on women of any age who have unclear Pap test results.  Other health care providers may not recommend any screening for nonpregnant women who are considered low risk for pelvic cancer and have no symptoms. Ask your health care provider if a screening pelvic exam is right for you.  If you have had past treatment for cervical cancer or a condition that could lead to cancer, you need Pap tests and screening for cancer for at least 20 years after your treatment. If Pap tests have been discontinued for you, your risk factors (such as having a new sexual partner) need to be  reassessed to determine if you should start having screenings again. Some women have medical problems that increase the chance of getting cervical cancer. In these cases, your health care provider may recommend that you have screening and Pap tests more often.  If you have a family history of uterine cancer or ovarian cancer, talk with your health care provider about genetic screening.  If you have vaginal bleeding after reaching menopause, tell your health care provider.  There are currently no reliable tests available to screen for ovarian cancer.  Lung Cancer Lung cancer screening is recommended for adults 69-62 years old who are at high risk for lung cancer because of a history of smoking. A yearly low-dose CT scan of the lungs is recommended if you:  Currently smoke.  Have a history of at least 30 pack-years of smoking and you currently smoke or have quit within the past 15 years. A pack-year is smoking an average of one pack of cigarettes per day for one year.  Yearly screening should:  Continue until it has been 15 years since you quit.  Stop if you develop a health problem that would prevent you from having lung cancer treatment.  Colorectal Cancer  This type of cancer can be detected and can often be prevented.  Routine colorectal cancer screening usually begins at  age 42 and continues through age 45.  If you have risk factors for colon cancer, your health care provider may recommend that you be screened at an earlier age.  If you have a family history of colorectal cancer, talk with your health care provider about genetic screening.  Your health care provider may also recommend using home test kits to check for hidden blood in your stool.  A small camera at the end of a tube can be used to examine your colon directly (sigmoidoscopy or colonoscopy). This is done to check for the earliest forms of colorectal cancer.  Direct examination of the colon should be repeated every  5-10 years until age 71. However, if early forms of precancerous polyps or small growths are found or if you have a family history or genetic risk for colorectal cancer, you may need to be screened more often.  Skin Cancer  Check your skin from head to toe regularly.  Monitor any moles. Be sure to tell your health care provider: ? About any new moles or changes in moles, especially if there is a change in a mole's shape or color. ? If you have a mole that is larger than the size of a pencil eraser.  If any of your family members has a history of skin cancer, especially at a young age, talk with your health care provider about genetic screening.  Always use sunscreen. Apply sunscreen liberally and repeatedly throughout the day.  Whenever you are outside, protect yourself by wearing long sleeves, pants, a wide-brimmed hat, and sunglasses.  What should I know about osteoporosis? Osteoporosis is a condition in which bone destruction happens more quickly than new bone creation. After menopause, you may be at an increased risk for osteoporosis. To help prevent osteoporosis or the bone fractures that can happen because of osteoporosis, the following is recommended:  If you are 46-71 years old, get at least 1,000 mg of calcium and at least 600 mg of vitamin D per day.  If you are older than age 55 but younger than age 65, get at least 1,200 mg of calcium and at least 600 mg of vitamin D per day.  If you are older than age 54, get at least 1,200 mg of calcium and at least 800 mg of vitamin D per day.  Smoking and excessive alcohol intake increase the risk of osteoporosis. Eat foods that are rich in calcium and vitamin D, and do weight-bearing exercises several times each week as directed by your health care provider. What should I know about how menopause affects my mental health? Depression may occur at any age, but it is more common as you become older. Common symptoms of depression  include:  Low or sad mood.  Changes in sleep patterns.  Changes in appetite or eating patterns.  Feeling an overall lack of motivation or enjoyment of activities that you previously enjoyed.  Frequent crying spells.  Talk with your health care provider if you think that you are experiencing depression. What should I know about immunizations? It is important that you get and maintain your immunizations. These include:  Tetanus, diphtheria, and pertussis (Tdap) booster vaccine.  Influenza every year before the flu season begins.  Pneumonia vaccine.  Shingles vaccine.  Your health care provider may also recommend other immunizations. This information is not intended to replace advice given to you by your health care provider. Make sure you discuss any questions you have with your health care provider. Document Released: 08/10/2005  Document Revised: 01/06/2016 Document Reviewed: 03/22/2015 Elsevier Interactive Patient Education  2018 [employer]  

## 2017-08-06 NOTE — Progress Notes (Signed)
HPI:  Pt presents to the clinic today for her Medicare Wellness Exam. She is also due to follow up chronic conditions.  Anxiety and Depression: She reports she has been out of her Wellbutrin so her mood has been a little worse lately. She is still taking her Paxil. She would like a refill of her Wellbutrin today. She denies SI/HI.  Chronic Pain/Fibromyalgia: She has an internal pain pump. She is also taking Meloxicam, Lyrica and Oxycodone as prescribed. She follows with Dr. Wynona Canes.  IBS: Mainly constipation. This hasn't been an issue lately. She takes Mirilax daily.   HLD with CAD: Her last LDL was 70. She is taking Rosuvastatin and Fish Oil as prescribed. She denies myalgias or angina. She has Nitro to take if needed.   HTN: Her BP today is 140/90. She is not taking any antihypertensive medication at this time. She reports her blood pressure is reflective of her pain level.  DM 2 with Peripheral Neuropathy: Her last A1C was 5.8 %. She does not check her sugars. Her diabetes is diet controlled. She does take Lyrica for neuropathic pain.  OSA: She averages 7 hours a night with the use of her CPAP. She reports she feels rested when she wakes up.  She also reports strong urine odor and vaginal itching. This started 1 month ago. She denies dysuria, urgency, frequency or blood in her urine. She denies vaginal discharge. She has not been on abx recently. She has not tried anything OTC for her symptoms.   Past Medical History:  Diagnosis Date  . Anemia    years ago  . Anxiety   . Blue toes    2nd toe on right foot, will get appt.  . Bulging disc   . CAD (coronary artery disease) 2009   s/p stent to LAD  . 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 Chicken pox   . Coronary arteritis   . Degenerative disc disease   . Depression   . Diabetes mellitus without complication ([place])   . Facet joint disease   . Fibromyalgia   . Heart disease   . Hyperlipidemia   . IBS (irritable  bowel syndrome)   . MCL deficiency, knee   . MRSA (methicillin resistant staph aureus) culture positive 2011   GREAT TOE RIGHT FOOT  . Neuropathy 04/18/2010  . Peripheral neuropathy   . Sleep apnea    uses CPAP, sleep study at [facility]  . Spinal stenosis     Current Outpatient Medications  Medication Sig Dispense Refill  . ACCU-CHEK SOFTCLIX LANCETS lancets Use to check blood sugar 2 times daily.  Dx: E11.42 100 each 2  . AMBULATORY NON FORMULARY MEDICATION Medication Name: CPAP MASK OF CHOICE FOR HOME DEVICE 1 each 0  . aspirin 81 MG tablet Take 81 mg by mouth daily.    Marland Kitchen buPROPion (WELLBUTRIN XL) 150 MG 24 hr tablet Take 1 tablet (150 mg total) by mouth daily. MUST SCHEDULE ANNUAL EXAM 30 tablet 0  . Cholecalciferol (VITAMIN D3) 2000 units TABS Take 2,000 Units by mouth daily.    . Chromium Picolinate 800 MCG TABS Take 800 mcg by mouth daily.    Wallace Cullens POWD Take 1 capsule by mouth daily.    . Coenzyme Q10 (CO Q 10) 100 MG CAPS Take 200 mg by mouth daily.     Marland Kitchen glucosamine-chondroitin 500-400 MG tablet Take 1 tablet by mouth daily.     Marland Kitchen glucose blood (ACCU-CHEK AVIVA PLUS)  test strip TEST 2 TIMES DAY  **E11.9** 200 each 1  . glucose blood test strip 1 each by Other route. Use as instructed    . GRAPE SEED EXTRACT PO Take 1 capsule by mouth daily.    Marland Kitchen ibuprofen (ADVIL,MOTRIN) 200 MG tablet Take 600 mg by mouth every 8 (eight) hours as needed (for pain.).    Marland Kitchen MAGNESIUM MALATE PO Take 115 mg by mouth daily.    . meloxicam (MOBIC) 15 MG tablet Take 1 tablet (15 mg total) by mouth daily. 90 tablet 0  . Multiple Vitamin (MULTIVITAMIN) tablet Take 1 tablet by mouth daily.    . naloxone (NARCAN) 2 MG/2ML injection Inject 1 mL (1 mg total) into the muscle as needed (for opioid overdose). Inject content of syringe into thigh muscle. Call 911. 2 Syringe 1  . nitroGLYCERIN (NITROSTAT) 0.4 MG SL tablet Place 1 tablet (0.4 mg total) under the tongue every 5 (five) minutes as  needed for chest pain. 25 tablet prn  . Omega-3 Fatty Acids (FISH OIL) 1200 MG CAPS Take 1,200 mg by mouth daily.     Derrill Memo ON 10/02/2017] oxyCODONE (OXY IR/ROXICODONE) 5 MG immediate release tablet Take 1 tablet (5 mg total) by mouth every 8 (eight) hours as needed for severe pain. 90 tablet 0  . [START ON 09/02/2017] oxyCODONE (OXY IR/ROXICODONE) 5 MG immediate release tablet Take 1 tablet (5 mg total) by mouth every 8 (eight) hours as needed for severe pain. 90 tablet 0  . oxyCODONE (OXY IR/ROXICODONE) 5 MG immediate release tablet Take 1 tablet (5 mg total) by mouth 3 (three) times daily as needed for severe pain. 90 tablet 0  . PARoxetine (PAXIL) 20 MG tablet TAKE ONE-HALF OF A TABLET BY MOUTH TWICEDAILY (Patient taking differently: TAKE ONE-HALF OF A TABLET BY MOUTH DAILY IN THE MORNING) 90 tablet 2  . polyethylene glycol (MIRALAX / GLYCOLAX) packet Take 17 g by mouth daily as needed (for constipation.).     Marland Kitchen pregabalin (LYRICA) 100 MG capsule Take 1 capsule (100 mg total) by mouth 3 (three) times daily. 270 capsule 0  . rosuvastatin (CRESTOR) 20 MG tablet TAKE 1 TABLET BY MOUTH DAILY 90 tablet 1  . tiZANidine (ZANAFLEX) 4 MG capsule Take 1 capsule (4 mg total) by mouth 3 (three) times daily. 270 capsule 0  . vitamin E 400 UNIT capsule Take 400 Units by mouth daily.    Marland Kitchen PAIN MANAGEMENT IT PUMP REFILL 1 each by Intrathecal route once for 1 dose. Medication: PF Fentanyl 500.0 mcg/ml PF Baclofen 300.61mcg/ml PF Bupivicaine 20.0 mg/ml Total Volume: 40 ml Needed by 07-31-17 @ 1000 1 each 0   No current facility-administered medications for this visit.     No Known Allergies  Family History  Problem Relation Age of Onset  . Lung cancer Mother   . Diabetes Father   . Heart disease Maternal Grandfather   . Diabetes Paternal Grandmother   . Colon cancer Paternal Grandfather   . Stroke Neg Hx     Social History   Socioeconomic History  . Marital status: Significant Other    Spouse  name: Not on file  . Number of children: Not on file  . Years of education: Not on file  . Highest education level: Not on file  Social Needs  . Financial resource strain: Not on file  . Food insecurity - worry: Not on file  . Food insecurity - inability: Not on file  . Transportation needs - medical:  Not on file  . Transportation needs - non-medical: Not on file  Occupational History  . Not on file  Tobacco Use  . Smoking status: Former Smoker    Packs/day: 1.50    Years: 32.00    Pack years: 48.00    Types: Cigarettes    Last attempt to quit: 07/22/2007    Years since quitting: 10.0  . Smokeless tobacco: Never Used  Substance and Sexual Activity  . Alcohol use: Yes    Alcohol/week: 0.0 oz    Comment: occ  . Drug use: No  . Sexual activity: Yes  Other Topics Concern  . Not on file  Social History Narrative   Lives at home with a partner. Independent at baseline    Hospitiliaztions: 01/2017, abscess of foot, toe amputation.  Health Maintenance:    Flu: 07/2015  Tetanus: 08/2007  Pneumovax: 07/2015  Zostavax: never  Mammogram: 03/2015  Pap Smear: more than 5 years ago  Colon Screening: 2011  Eye Doctor: annually  Dental Exam: as needed   Providers:   PCP: [name], NP  Pain Management: Dr. Daleen Squibb.  Cardiologist: Dr. Marlou Porch     I have personally reviewed and have noted:  1. The patient's medical and social history 2. Their use of alcohol, tobacco or illicit drugs 3. Their current medications and supplements 4. The patient's functional ability including ADL's, fall risks, home safety risks and hearing or visual impairment. 5. Diet and physical activities 6. Evidence for depression or mood disorder  Subjective:   Review of Systems:   Constitutional: Pt reports fatigue. Denies fever, malaise, headache or abrupt weight changes.  HEENT: Denies eye pain, eye redness, ear pain, ringing in the ears, wax buildup, runny nose, nasal congestion, bloody nose, or  sore throat. Respiratory: Denies difficulty breathing, shortness of breath, cough or sputum production.   Cardiovascular: Denies chest pain, chest tightness, palpitations or swelling in the hands or feet.  Gastrointestinal: Pt reports intermittent constipation. Denies abdominal pain, bloating, diarrhea or blood in the stool.  GU: Pt reports urine odor and vaginal itching. Denies urgency, frequency, pain with urination, burning sensation, blood in urine, or discharge. Musculoskeletal: Pt reports chronic joint pain. Denies difficulty with gait, or joint swelling.  Skin: Denies redness, rashes, lesions or ulcercations.  Neurological: Pt reports neuropathic pain in feet. Denies dizziness, difficulty with memory, difficulty with speech or problems with balance and coordination.  Psych: Pt has a history of anxiety and depression. Denies SI/HI.  No other specific complaints in a complete review of systems (except as listed in HPI above).  Objective:  PE:   BP 140/90   Pulse 81   Temp 98.4 F (36.9 C) (Oral)   Ht 5\' 9"  (1.753 m)   Wt 233 lb (105.7 kg)   SpO2 97%   BMI 34.41 kg/m  Wt Readings from Last 3 Encounters:  08/06/17 233 lb (105.7 kg)  07/31/17 228 lb (103.4 kg)  05/02/17 219 lb 8 oz (99.6 kg)    General: Appears her stated age, obese in NAD. Skin: Warm, dry and intact.  Cardiovascular: Normal rate and rhythm. S1,S2 noted.  No murmur, rubs or gallops noted. No JVD or BLE edema. No carotid bruits noted. Pulmonary/Chest: Normal effort and positive vesicular breath sounds. No respiratory distress. No wheezes, rales or ronchi noted.  Abdomen: Soft and nontender. Normal bowel sounds. No distention or masses noted.  Pelvic: Normal female anatomy. CMT not visualized. Adnexa non palpable. Musculoskeletal: No difficulty with gait. Neurological: Alert and  oriented. Sensation intact to BLE. Psychiatric: Mood and affect normal. Behavior is normal. Judgment and thought content normal.     BMET    Component Value Date/Time   NA 141 02/23/2017 0321   NA 135 (L) 06/30/2013 1203   K 4.5 02/23/2017 0321   K 4.1 06/30/2013 1203   CL 108 02/23/2017 0321   CL 102 06/30/2013 1203   CO2 28 02/23/2017 0321   CO2 32 06/30/2013 1203   GLUCOSE 147 (H) 02/23/2017 0321   GLUCOSE 143 (H) 06/30/2013 1203   BUN 24 (H) 02/23/2017 0321   BUN 19 (H) 06/30/2013 1203   CREATININE 0.97 02/23/2017 0321   CREATININE 0.89 06/30/2013 1203   CALCIUM 8.4 (L) 02/23/2017 0321   CALCIUM 9.7 06/30/2013 1203   GFRNONAA >60 02/23/2017 0321   GFRNONAA >60 06/30/2013 1203   GFRAA >60 02/23/2017 0321   GFRAA >60 06/30/2013 1203    Lipid Panel     Component Value Date/Time   CHOL 133 05/18/2016 1526   TRIG 205.0 (H) 05/18/2016 1526   HDL 37.20 (L) 05/18/2016 1526   CHOLHDL 4 05/18/2016 1526   VLDL 41.0 (H) 05/18/2016 1526   LDLCALC 62 09/11/2015 0421    CBC    Component Value Date/Time   WBC 5.9 02/23/2017 0321   RBC 3.84 02/23/2017 0321   HGB 11.3 (L) 02/23/2017 0321   HGB 14.0 11/12/2011 1136   HCT 32.7 (L) 02/23/2017 0321   HCT 40.7 11/12/2011 1136   PLT 157 02/23/2017 0321   PLT 274 11/12/2011 1136   MCV 85.3 02/23/2017 0321   MCV 86 11/12/2011 1136   MCH 29.4 02/23/2017 0321   MCHC 34.4 02/23/2017 0321   RDW 13.2 02/23/2017 0321   RDW 12.8 11/12/2011 1136   LYMPHSABS 1.6 01/31/2017 1236   LYMPHSABS 2.5 11/12/2011 1136   MONOABS 0.5 01/31/2017 1236   MONOABS 0.8 11/12/2011 1136   EOSABS 0.2 01/31/2017 1236   EOSABS 0.6 11/12/2011 1136   BASOSABS 0.0 01/31/2017 1236   BASOSABS 0.1 11/12/2011 1136    Hgb A1C Lab Results  Component Value Date   HGBA1C 5.8 (H) 02/22/2017      Assessment and Plan:   Medicare Annual Wellness Visit:  Diet: She does eat meat. She consumes fruits and veggies daily. She rarely eats fried foods. She drinks mostly water. Physical activity: Sedentary Depression/mood screen: Chronic, on meds Hearing: Intact to whispered voice Visual  acuity: Grossly normal, performs annual eye exam  ADLs: Capable Fall risk: High, chronic narcotic use Home safety: Good Cognitive evaluation: Intact to orientation, naming, recall and repetition EOL planning: No adv directives, full code/ I agree  Preventative Medicine: Flu shot today. She declines tetanus. She will ask her insurance about Shingrix. Pneumovax UTD. Mammogram ordered, she will call to schedule. Pap smear today. Colon screening UTD. Encouraged her to consume a balanced diet and exercise regimen. Advised her to see an eye doctor and dentist annually. Will check CBC, CMET, Lipid, A1C, Microalbumin and Vit D today  Urine Odor, Vaginal Itching:  Urinalysis normal Will send urine culture  Next appointment: 1 year, annual exam   [name], NP

## 2017-08-07 ENCOUNTER — Other Ambulatory Visit (HOSPITAL_COMMUNITY)
Admission: RE | Admit: 2017-08-07 | Discharge: 2017-08-07 | Disposition: A | Discharge status: Home / Self Care | Payer: Medicare Other | Source: Ambulatory Visit | Attending: Internal Medicine | Admitting: Internal Medicine

## 2017-08-07 DIAGNOSIS — Z124 Encounter for screening for malignant neoplasm of cervix: Secondary | ICD-10-CM | POA: Insufficient documentation

## 2017-08-07 LAB — HEMOGLOBIN A1C: Hgb A1c MFr Bld: 6.4 % (ref 4.6–6.5)

## 2017-08-07 LAB — URINE CULTURE
MICRO NUMBER:: 90154053
Result:: NO GROWTH
SPECIMEN QUALITY:: ADEQUATE

## 2017-08-07 LAB — COMPREHENSIVE METABOLIC PANEL
ALT: 24 U/L (ref 0–35)
AST: 25 U/L (ref 0–37)
Albumin: 4.4 g/dL (ref 3.5–5.2)
Alkaline Phosphatase: 93 U/L (ref 39–117)
BUN: 26 mg/dL — ABNORMAL HIGH (ref 6–23)
CO2: 32 mEq/L (ref 19–32)
Calcium: 9.4 mg/dL (ref 8.4–10.5)
Chloride: 104 mEq/L (ref 96–112)
Creatinine, Ser: 1.01 mg/dL (ref 0.40–1.20)
GFR: 59.45 mL/min — ABNORMAL LOW (ref >60.00)
Glucose, Bld: 90 mg/dL (ref 70–99)
Potassium: 4.2 mEq/L (ref 3.5–5.1)
Sodium: 140 mEq/L (ref 135–145)
Total Bilirubin: 0.4 mg/dL (ref 0.2–1.2)
Total Protein: 7.5 g/dL (ref 6.0–8.3)

## 2017-08-07 LAB — VITAMIN D 25 HYDROXY (VIT D DEFICIENCY, FRACTURES): VITD: 35.8 ng/mL (ref 30.00–100.00)

## 2017-08-07 LAB — CBC
HCT: 38.9 % (ref 36.0–46.0)
Hemoglobin: 13.2 g/dL (ref 12.0–15.0)
MCHC: 34 g/dL (ref 30.0–36.0)
MCV: 84.9 fl (ref 78.0–100.0)
Platelets: 213 10*3/uL (ref 150.0–400.0)
RBC: 4.58 Mil/uL (ref 3.87–5.11)
RDW: 13.2 % (ref 11.5–15.5)
WBC: 6.5 10*3/uL (ref 4.0–10.5)

## 2017-08-07 LAB — LIPID PANEL
Cholesterol: 121 mg/dL (ref 0–200)
HDL: 35.3 mg/dL — ABNORMAL LOW (ref >39.00)
NonHDL: 85.82
Total CHOL/HDL Ratio: 3
Triglycerides: 237 mg/dL — ABNORMAL HIGH (ref 0.0–149.0)
VLDL: 47.4 mg/dL — ABNORMAL HIGH (ref 0.0–40.0)

## 2017-08-07 LAB — LDL CHOLESTEROL, DIRECT: Direct LDL: 60 mg/dL

## 2017-08-09 DIAGNOSIS — I158 Other secondary hypertension: Secondary | ICD-10-CM | POA: Insufficient documentation

## 2017-08-09 DIAGNOSIS — G4733 Obstructive sleep apnea (adult) (pediatric): Secondary | ICD-10-CM | POA: Insufficient documentation

## 2017-08-09 LAB — CYTOLOGY - PAP
Diagnosis: NEGATIVE
HPV: NOT DETECTED

## 2017-08-09 NOTE — Assessment & Plan Note (Signed)
Pain related She refuses antihypertensive medications CBC and CMET today

## 2017-08-09 NOTE — Assessment & Plan Note (Signed)
Follows with Dr. Wynona Canes Continue Meloxicam, Lyrica and Oxycodone

## 2017-08-09 NOTE — Assessment & Plan Note (Signed)
No angina Continue Rosuvastatin and Fish Oil Has Nitro if needed

## 2017-08-09 NOTE — Assessment & Plan Note (Signed)
Continue CPAP   Encouraged weight loss

## 2017-08-09 NOTE — Assessment & Plan Note (Signed)
Slightly worse off Wellbutrin, will refill Continue Paxil Support offered today

## 2017-08-09 NOTE — Assessment & Plan Note (Signed)
CMET and lipid profile today Encouraged her to consume a low fat diet Continue Rosuvastatin and Fish Oil

## 2017-08-09 NOTE — Assessment & Plan Note (Signed)
A1C today Encouraged her to consume a balanced diet and exercise regimen Advised her to see an eye doctor yearly Foot exam today Flu shot today Pneumovax UTD

## 2017-08-09 NOTE — Assessment & Plan Note (Signed)
Continue daily Mirilax

## 2017-08-09 NOTE — Addendum Note (Signed)
Addended by: Lurlean Nanny on: 08/09/2017 02:42 PM   Modules accepted: Orders

## 2017-08-14 ENCOUNTER — Emergency Department: Payer: Medicare Other

## 2017-08-14 ENCOUNTER — Emergency Department
Admission: EM | Admit: 2017-08-14 | Discharge: 2017-08-14 | Disposition: A | Discharge status: Home / Self Care | Payer: Medicare Other | Attending: Emergency Medicine | Admitting: Emergency Medicine

## 2017-08-14 ENCOUNTER — Other Ambulatory Visit: Payer: Self-pay

## 2017-08-14 ENCOUNTER — Encounter: Payer: Self-pay | Admitting: Emergency Medicine

## 2017-08-14 DIAGNOSIS — Z79899 Other long term (current) drug therapy: Secondary | ICD-10-CM | POA: Insufficient documentation

## 2017-08-14 DIAGNOSIS — Z7982 Long term (current) use of aspirin: Secondary | ICD-10-CM | POA: Diagnosis not present

## 2017-08-14 DIAGNOSIS — L089 Local infection of the skin and subcutaneous tissue, unspecified: Secondary | ICD-10-CM | POA: Diagnosis not present

## 2017-08-14 DIAGNOSIS — Z955 Presence of coronary angioplasty implant and graft: Secondary | ICD-10-CM | POA: Diagnosis not present

## 2017-08-14 DIAGNOSIS — Z87891 Personal history of nicotine dependence: Secondary | ICD-10-CM | POA: Insufficient documentation

## 2017-08-14 DIAGNOSIS — I2510 Atherosclerotic heart disease of native coronary artery without angina pectoris: Secondary | ICD-10-CM | POA: Diagnosis not present

## 2017-08-14 DIAGNOSIS — E1140 Type 2 diabetes mellitus with diabetic neuropathy, unspecified: Secondary | ICD-10-CM | POA: Insufficient documentation

## 2017-08-14 DIAGNOSIS — L03012 Cellulitis of left finger: Secondary | ICD-10-CM | POA: Diagnosis not present

## 2017-08-14 DIAGNOSIS — L988 Other specified disorders of the skin and subcutaneous tissue: Secondary | ICD-10-CM | POA: Diagnosis present

## 2017-08-14 LAB — COMPREHENSIVE METABOLIC PANEL
ALT: 23 U/L (ref 14–54)
AST: 26 U/L (ref 15–41)
Albumin: 4.3 g/dL (ref 3.5–5.0)
Alkaline Phosphatase: 105 U/L (ref 38–126)
Anion gap: 11 (ref 5–15)
BUN: 24 mg/dL — ABNORMAL HIGH (ref 6–20)
CO2: 25 mmol/L (ref 22–32)
Calcium: 9.1 mg/dL (ref 8.9–10.3)
Chloride: 105 mmol/L (ref 101–111)
Creatinine, Ser: 0.99 mg/dL (ref 0.44–1.00)
GFR calc Af Amer: >60 mL/min (ref >60)
GFR calc non Af Amer: >60 mL/min (ref >60)
Glucose, Bld: 110 mg/dL — ABNORMAL HIGH (ref 65–99)
Potassium: 4.6 mmol/L (ref 3.5–5.1)
Sodium: 141 mmol/L (ref 135–145)
Total Bilirubin: 0.7 mg/dL (ref 0.3–1.2)
Total Protein: 7.9 g/dL (ref 6.5–8.1)

## 2017-08-14 LAB — CBC WITH DIFFERENTIAL/PLATELET
Basophils Absolute: 0 10*3/uL (ref 0–0.1)
Basophils Relative: 1 %
Eosinophils Absolute: 0.3 10*3/uL (ref 0–0.7)
Eosinophils Relative: 4 %
HCT: 38 % (ref 35.0–47.0)
Hemoglobin: 12.6 g/dL (ref 12.0–16.0)
Lymphocytes Relative: 29 %
Lymphs Abs: 1.8 10*3/uL (ref 1.0–3.6)
MCH: 28.3 pg (ref 26.0–34.0)
MCHC: 33.3 g/dL (ref 32.0–36.0)
MCV: 85 fL (ref 80.0–100.0)
Monocytes Absolute: 0.6 10*3/uL (ref 0.2–0.9)
Monocytes Relative: 9 %
Neutro Abs: 3.6 10*3/uL (ref 1.4–6.5)
Neutrophils Relative %: 57 %
Platelets: 215 10*3/uL (ref 150–440)
RBC: 4.47 MIL/uL (ref 3.80–5.20)
RDW: 13 % (ref 11.5–14.5)
WBC: 6.2 10*3/uL (ref 3.6–11.0)

## 2017-08-14 LAB — LACTIC ACID, PLASMA: Lactic Acid, Venous: 0.8 mmol/L (ref 0.5–1.9)

## 2017-08-14 MED ORDER — CIPROFLOXACIN HCL 500 MG PO TABS
ORAL_TABLET | ORAL | Status: AC
  Filled 2017-08-14: qty 1

## 2017-08-14 MED ORDER — DOXYCYCLINE HYCLATE 100 MG PO CAPS: ORAL_CAPSULE | ORAL | 0 refills | Status: DC

## 2017-08-14 MED ORDER — CIPROFLOXACIN HCL 500 MG PO TABS
500.0000 mg | ORAL_TABLET | ORAL | Status: AC
Start: 2017-08-14 — End: 2017-08-14
  Administered 2017-08-14: 500 mg via ORAL

## 2017-08-14 MED ORDER — DOXYCYCLINE HYCLATE 100 MG PO TABS
100.0000 mg | ORAL_TABLET | Freq: Once | ORAL | Status: AC
  Administered 2017-08-14: 100 mg via ORAL

## 2017-08-14 MED ORDER — DOXYCYCLINE HYCLATE 100 MG PO TABS
ORAL_TABLET | ORAL | Status: AC
  Administered 2017-08-14: 100 mg via ORAL
  Filled 2017-08-14: qty 1

## 2017-08-14 NOTE — ED Triage Notes (Signed)
Patient to ER for c/o necrotic fourth toe on right foot. States she has h/o diabetic neuropathy, has been watching toe d/t toe being previously broken. Noticed odor when taking off shoe today and noticed toe is now necrotic. States "toe nail looks as though it will just lift off if touched". Patient denies any fevers.

## 2017-08-14 NOTE — ED Notes (Signed)
Pt comes in, states that she doesn't feel her toes.  Pt is +diabetes.  Pt states that she lost her R great toe and the toe next to it due to MRSA and sepsis infection.  Pt's R fourth toes has purulent drainage and discoloration.  Pt reports this is the same as the others started.  Pt attempted to contact Dr. Vickki Muff today, but was unsuccessful.  Pt is A&Ox4, in NAD.

## 2017-08-14 NOTE — Discharge Instructions (Signed)
As we discussed, your lab work was reassuring and there is no sign of bone infection on your x-ray.  We discussed your case by phone with Dr. Cleda Mccreedy, and he recommended that he start taking doxycycline and call the clinic tomorrow to schedule an appointment for tomorrow either with him or Dr. Vickki Muff.  Please continue taking your regular medications as well.  Return to the emergency department if you develop new or worsening symptoms that concern you.

## 2017-08-14 NOTE — ED Provider Notes (Signed)
[name] Provider Note  ____________________________________________   First MD Initiated Contact with Patient 08/14/17 2058     (approximate)  I have reviewed the triage vital signs and the nursing notes.   HISTORY  Chief Complaint Toe Pain and Wound Infection    HPI Melinda [name] is a 62 y.o. female with extensive PMH as described below who presents for evaluation of a relatively acute onset infection in her right fourth toe.  She states that she typically does not have a wound or infection and she watches it very carefully because of multiple prior infections and two prior  amputations (great toe and second toe) in that foot.  She is followed by Dr. Vickki Muff.  She reports that within the last 3 days she has developed some blistering and some pus under the nail of the fourth toe with some swelling and developing and spreading redness.  She has had a MRSA infection that developed into sepsis previously so she wants to get on top of the infection quickly.  She tried calling her podiatrist office today but it was after 5:00 PM so no one was available so she came here instead.  She denies fever/chills, chest pain, shortness of breath, nausea, vomiting, abdominal pain.  She does have extensive chronic pain syndromes but she has chronic severe peripheral neuropathy so she has no pain in fact almost no sensation at all in her feet.  She has had no change in her gait.  Nothing in particular makes the symptoms better or worse but they have gradually getting worse over the last 3 days.  She describes the infection as severe.  Past Medical History:  Diagnosis Date  . Anemia    years ago  . Anxiety   . Blue toes    2nd toe on right foot, will get appt.  . Bulging disc   . CAD (coronary artery disease) 2009   s/p stent to LAD  . 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 Chicken pox   . Coronary arteritis   . Degenerative disc  disease   . Depression   . Diabetes mellitus without complication ([place])   . Facet joint disease   . Fibromyalgia   . Heart disease   . Hyperlipidemia   . IBS (irritable bowel syndrome)   . MCL deficiency, knee   . MRSA (methicillin resistant staph aureus) culture positive 2011   GREAT TOE RIGHT FOOT  . Neuropathy 04/18/2010  . Peripheral neuropathy   . Sleep apnea    uses CPAP, sleep study at [facility]  . Spinal stenosis     Patient Active Problem List   Diagnosis Date Noted  . Other secondary hypertension 08/09/2017  . OSA (obstructive sleep apnea) 08/09/2017  . Osteomyelitis ([place]) 02/22/2017  . Osteoarthritis of knee (Bilateral) (R>L) 06/08/2016  . Long term current use of opiate analgesic 04/21/2016  . Long term prescription opiate use 04/21/2016  . Opiate use 04/21/2016  . Encounter for adjustment or management of infusion pump 04/21/2016  . Osteoarthritis, multiple sites 04/21/2016  . Musculoskeletal pain 04/21/2016  . Anxiety and depression 09/11/2015  . Opioid-induced constipation (OIC) 07/27/2015  . Chronic knee pain (Location of Primary Source of Pain) (Right) 07/27/2015  . Osteoarthritis of knee (Left) 07/27/2015  . 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 Chronic neck pain 05/24/2015  . Chronic low back pain (Bilateral) (L>R) 05/24/2015  . Epidural  fibrosis 05/24/2015  . Cervical spondylosis 05/24/2015  . Neuropathic pain 05/24/2015  . Fibromyalgia 05/24/2015  . Presence of functional implant (Medtronic programmable intrathecal pump) (Right abdominal area) 05/24/2015  . Presence of intrathecal pump (Medtronic intrathecal programmable pump) (40 mL pump) 05/24/2015  . Great toe amputation status ([place]) 10/25/2014  . DM2 (diabetes mellitus, type 2) ([place]) 04/27/2014  . Pure hypercholesterolemia 03/02/2014  . Obesity (BMI 30-39.9) 10/20/2013  . CAD (coronary artery disease) 10/04/2013  . Chronic  pain syndrome 04/18/2010    Past Surgical History:  Procedure Laterality Date  . ABLATION     UTERUS  . ABLATION     HEART  . AMPUTATION TOE Right 02/01/2017   Procedure: AMPUTATION TOE-RIGHT 2ND MPJ;  Surgeon: Albertine Patricia, DPM;  Location: [facility] ORS;  Service: Podiatry;  Laterality: Right;  . ANTERIOR CERVICAL DECOMP/DISCECTOMY FUSION N/A 07/28/2014   Procedure: ANTERIOR CERVICAL DECOMPRESSION/DISCECTOMY FUSION CERVICAL 3-4,4-5,5-6 LEVELS WITH INSTRUMENTATION AND ALLOGRAFT;  Surgeon: Sinclair Ship, MD;  Location: [facility];  Service: Orthopedics;  Laterality: N/A;  Anterior cervical decompression fusion, cervical 3-4, cervical 4-5, cervical 5-6 with instrumentation and allograft  . BACK SURGERY     X 3 1979, 1994, 1995  . CARPAL TUNNEL RELEASE Right   . CHOLECYSTECTOMY  2003  . CORONARY ANGIOPLASTY    . EYE SURGERY Bilateral 2013   Eyelid lift   . FOOT SURGERY Right    BIG TOE  . FOOT SURGERY Bilateral    PLANTAR FASCIITIS  . FOOT SURGERY Right    2ND TOE  . GALLBLADDER SURGERY    . HAND SURGERY Left   . HAND SURGEY Left   . HEART STENT  2009   LAD  . INFUSION PUMP IMPLANTATION     X2 with morphine and baclofen  . IRRIGATION AND DEBRIDEMENT FOOT Right 02/23/2017   Procedure: IRRIGATION AND DEBRIDEMENT FOOT-right foot;  Surgeon: Samara Deist, DPM;  Location: [facility];  Service: Podiatry;  Laterality: Right;  . TOE SURGERY     then revision 8/18    Prior to Admission medications   Medication Sig Start Date End Date Taking? Authorizing Provider  ACCU-CHEK SOFTCLIX LANCETS lancets Use to check blood sugar 2 times daily.  Dx: E11.42 01/21/17   Jearld Fenton, NP  AMBULATORY NON FORMULARY MEDICATION Medication Name: CPAP MASK OF CHOICE FOR HOME DEVICE 07/01/15   Laverle Hobby, MD  aspirin 81 MG tablet Take 81 mg by mouth daily.    [provider]  buPROPion (WELLBUTRIN XL) 150 MG 24 hr tablet Take 1 tablet (150 mg total) by mouth daily. MUST SCHEDULE  ANNUAL EXAM 08/06/17   Jearld Fenton, NP  Cholecalciferol (VITAMIN D3) 2000 units TABS Take 2,000 Units by mouth daily.    [provider]  Chromium Picolinate 800 MCG TABS Take 800 mcg by mouth daily.    [provider]  Cinnamon Bark POWD Take 1 capsule by mouth daily.    [provider]  Coenzyme Q10 (CO Q 10) 100 MG CAPS Take 200 mg by mouth daily.     [provider]  doxycycline (VIBRAMYCIN) 100 MG capsule Take 1 capsule (100 mg) by mouth twice daily for 10 days. 08/14/17   Hinda Kehr, MD  glucosamine-chondroitin 500-400 MG tablet Take 1 tablet by mouth daily.     [provider]  glucose blood (ACCU-CHEK AVIVA PLUS) test strip TEST 2 TIMES DAY  **E11.9** 12/11/16   Baity, Coralie Keens, NP  glucose blood test strip 1  each by Other route. Use as instructed    [provider]  GRAPE SEED EXTRACT PO Take 1 capsule by mouth daily.    [provider]  ibuprofen (ADVIL,MOTRIN) 200 MG tablet Take 600 mg by mouth every 8 (eight) hours as needed (for pain.).    [provider]  MAGNESIUM MALATE PO Take 115 mg by mouth daily.    [provider]  meloxicam (MOBIC) 15 MG tablet Take 1 tablet (15 mg total) by mouth daily. 08/03/17 11/01/17  Vevelyn Francois, NP  Multiple Vitamin (MULTIVITAMIN) tablet Take 1 tablet by mouth daily.    [provider]  naloxone [facility]) 2 MG/2ML injection Inject 1 mL (1 mg total) into the muscle as needed (for opioid overdose). Inject content of syringe into thigh muscle. Call 911. 04/02/17   Vevelyn Francois, NP  nitroGLYCERIN (NITROSTAT) 0.4 MG SL tablet Place 1 tablet (0.4 mg total) under the tongue every 5 (five) minutes as needed for chest pain. 05/21/16   Jerline Pain, MD  Omega-3 Fatty Acids (FISH OIL) 1200 MG CAPS Take 1,200 mg by mouth daily.     [provider]  oxyCODONE (OXY IR/ROXICODONE) 5 MG immediate release tablet Take 1 tablet (5 mg total) by mouth every 8 (eight)  hours as needed for severe pain. 10/02/17 11/01/17  Vevelyn Francois, NP  oxyCODONE (OXY IR/ROXICODONE) 5 MG immediate release tablet Take 1 tablet (5 mg total) by mouth every 8 (eight) hours as needed for severe pain. 09/02/17 10/02/17  Vevelyn Francois, NP  oxyCODONE (OXY IR/ROXICODONE) 5 MG immediate release tablet Take 1 tablet (5 mg total) by mouth 3 (three) times daily as needed for severe pain. 08/03/17 09/02/17  Vevelyn Francois, NP  PAIN MANAGEMENT IT PUMP REFILL 1 each by Intrathecal route once for 1 dose. Medication: PF Fentanyl 500.0 mcg/ml PF Baclofen 300.24mcg/ml PF Bupivicaine 20.0 mg/ml Total Volume: 40 ml Needed by 07-31-17 @ 1000 07/04/17 07/04/17  Vevelyn Francois, NP  PARoxetine (PAXIL) 20 MG tablet TAKE ONE-HALF OF A TABLET BY MOUTH TWICEDAILY Patient taking differently: TAKE ONE-HALF OF A TABLET BY MOUTH DAILY IN THE MORNING 06/22/16   Baity, Coralie Keens, NP  polyethylene glycol (MIRALAX / GLYCOLAX) packet Take 17 g by mouth daily as needed (for constipation.).     [provider]  pregabalin (LYRICA) 100 MG capsule Take 1 capsule (100 mg total) by mouth 3 (three) times daily. 08/03/17 11/01/17  Vevelyn Francois, NP  rosuvastatin (CRESTOR) 20 MG tablet TAKE 1 TABLET BY MOUTH DAILY 05/27/17   Jerline Pain, MD  tiZANidine (ZANAFLEX) 4 MG capsule Take 1 capsule (4 mg total) by mouth 3 (three) times daily. 08/03/17 11/01/17  Vevelyn Francois, NP  vitamin E 400 UNIT capsule Take 400 Units by mouth daily.    [provider]    Allergies Patient has no known allergies.  Family History  Problem Relation Age of Onset  . Lung cancer Mother   . Diabetes Father   . Heart disease Maternal Grandfather   . Diabetes Paternal Grandmother   . Colon cancer Paternal Grandfather   . Stroke Neg Hx     Social History Social History   Tobacco Use  . Smoking status: Former Smoker    Packs/day: 1.50    Years: 32.00    Pack years: 48.00    Types: Cigarettes    Last attempt to quit: 07/22/2007     Years since quitting: 10.0  .  Smokeless tobacco: Never Used  Substance Use Topics  . Alcohol use: Yes    Alcohol/week: 0.0 oz    Comment: occ  . Drug use: No    Review of Systems Constitutional: No fever/chills Eyes: No visual changes. ENT: No sore throat. Cardiovascular: Denies chest pain. Respiratory: Denies shortness of breath. Gastrointestinal: No abdominal pain.  No nausea, no vomiting.  No diarrhea.  No constipation. Genitourinary: Negative for dysuria. Musculoskeletal: Negative for neck pain.  Negative for back pain. Integumentary: Area of purulence developing on the top of the right fourth toe over the last three days. Neurological: Negative for headaches, focal weakness or numbness.   ____________________________________________   PHYSICAL EXAM:  VITAL SIGNS: ED Triage Vitals  Enc Vitals Group     BP 08/14/17 1947 (!) 141/85     Pulse Rate 08/14/17 1947 68     Resp 08/14/17 1947 20     Temp 08/14/17 1947 98.8 F (37.1 C)     Temp Source 08/14/17 1947 Oral     SpO2 08/14/17 1947 99 %     Weight 08/14/17 1948 105.7 kg (233 lb)     Height 08/14/17 1948 1.753 m (5\' 9" )     Head Circumference --      Peak Flow --      Pain Score 08/14/17 1947 0     Pain Loc --      Pain Edu? --      Excl. in [place]? --     Constitutional: Alert and oriented. Well appearing and in no acute distress. Eyes: Conjunctivae are normal.  Head: Atraumatic. Cardiovascular: Normal rate, regular rhythm. Good peripheral circulation.  Respiratory: Normal respiratory effort.  No retractions.  Musculoskeletal: See Skin exam below for toe details.  Otherwise, no lower extremity tenderness nor edema.  Neurologic:  Normal speech and language. No gross focal neurologic deficits are appreciated.  Psychiatric: Mood and affect are normal. Speech and behavior are normal. Skin:  Skin is warm, dry and intact.  Prior surgical amputations of first and second digits on right foot are well-appearing and  well-healed.  The fourth toe has what appears to be a purulent infection on the top of the distal phalanx spreading proximally.  There is some surrounding erythema.  There is no swelling or redness that extends beyond the digit to the foot itself.  See photo below:      ____________________________________________   LABS (all labs ordered are listed, but only abnormal results are displayed)  Labs Reviewed  COMPREHENSIVE METABOLIC PANEL - Abnormal; Notable for the following components:      Result Value   Glucose, Bld 110 (*)    BUN 24 (*)    All other components within normal limits  LACTIC ACID, PLASMA  CBC WITH DIFFERENTIAL/PLATELET  LACTIC ACID, PLASMA   ____________________________________________  EKG  None - EKG not ordered by ED physician ____________________________________________  RADIOLOGY Ursula Alert, personally viewed and evaluated these images (plain radiographs) as part of my medical decision making, as well as reviewing the written report by the radiologist.  ED MD interpretation: No osteomyelitis  Official radiology report(s): Dg Foot Complete Right  Result Date: 08/14/2017 CLINICAL DATA:  Patient to ER for c/o necrotic fourth toe on right foot. States she has h/o diabetic neuropathy, has been watching toe d/t toe being previously broken. Noticed odor when taking off shoe today and noticed toe is now necrotic. Infection to fourth toe. EXAM: RIGHT FOOT COMPLETE - 3+ VIEW COMPARISON:  None. FINDINGS:  Amputation of the second and first phalanges. There is no osseous erosion of the fourth digit. There is gas beneath the nail of the fourth digit consistent with infection. Third digit also demonstrates no cortical erosion. IMPRESSION: 1. No clear cortical erosion within the third or fourth digit to suggest osteomyelitis. 2. Soft tissue infection of third digit with gas within the nail bed. Electronically Signed   By: Suzy Bouchard M.D.   On: 08/14/2017 20:18     ____________________________________________   PROCEDURES  Critical Care performed: No   Procedure(s) performed:   Procedures   ____________________________________________   INITIAL IMPRESSION / ASSESSMENT AND PLAN / ED COURSE  As part of my medical decision making, I reviewed the following data within the [facility] notes reviewed and incorporated, Labs reviewed , Radiograph reviewed  and A phone consult was requested and obtained from this/these consultant(s) Podiatry (Dr. Cleda Mccreedy)    Differential diagnosis includes, but is not limited to, cellulitis, abscess, necrotizing deep tissue infection, gangrene, osteomyelitis, sepsis.  Fortunately the patient is well-appearing with normal vital signs and normal lab work.  No evidence of osteomyelitis on radiographs.  I called Dr. Cleda Mccreedy and spoke by phone.  He recommended starting patient on doxycycline and she will be seen in the podiatry clinic tomorrow.  I suggested adding Cipro for additional coverage and he agreed with that plan.  After I gave her a dose of doxycycline and Cipro here in the ED, I discovered that she takes tizanidine, which Cipro can increase the serum concentration of lead to side effects.  I informed the patient of this and opted to only prescribe doxycycline as result.  She understands the plan and will call the clinic in the morning for follow-up appointment.  I gave my usual and customary return precautions.      ____________________________________________  FINAL CLINICAL IMPRESSION(S) / ED DIAGNOSES  Final diagnoses:  Toe infection     MEDICATIONS GIVEN DURING THIS VISIT:  Medications  ciprofloxacin (CIPRO) tablet 500 mg (500 mg Oral Given 08/14/17 2152)  doxycycline (VIBRA-TABS) tablet 100 mg (100 mg Oral Given 08/14/17 2153)     ED Discharge Orders        Ordered    doxycycline (VIBRAMYCIN) 100 MG capsule     08/14/17 2200       Note:  This document was prepared  using Dragon voice recognition software and may include unintentional dictation errors.    Hinda Kehr, MD 08/14/17 2228

## 2017-08-15 DIAGNOSIS — E1142 Type 2 diabetes mellitus with diabetic polyneuropathy: Secondary | ICD-10-CM | POA: Diagnosis not present

## 2017-08-15 DIAGNOSIS — L03031 Cellulitis of right toe: Secondary | ICD-10-CM | POA: Diagnosis not present

## 2017-08-15 DIAGNOSIS — L97511 Non-pressure chronic ulcer of other part of right foot limited to breakdown of skin: Secondary | ICD-10-CM | POA: Diagnosis not present

## 2017-08-21 DIAGNOSIS — M205X1 Other deformities of toe(s) (acquired), right foot: Secondary | ICD-10-CM | POA: Diagnosis not present

## 2017-08-21 DIAGNOSIS — E1142 Type 2 diabetes mellitus with diabetic polyneuropathy: Secondary | ICD-10-CM | POA: Diagnosis not present

## 2017-08-21 DIAGNOSIS — L97511 Non-pressure chronic ulcer of other part of right foot limited to breakdown of skin: Secondary | ICD-10-CM | POA: Diagnosis not present

## 2017-08-24 ENCOUNTER — Emergency Department (HOSPITAL_COMMUNITY)
Admission: EM | Admit: 2017-08-24 | Discharge: 2017-08-24 | Disposition: A | Discharge status: Home / Self Care | Payer: Medicare Other | Attending: Physician Assistant | Admitting: Physician Assistant

## 2017-08-24 ENCOUNTER — Other Ambulatory Visit: Payer: Self-pay

## 2017-08-24 ENCOUNTER — Encounter (HOSPITAL_COMMUNITY): Payer: Self-pay | Admitting: *Deleted

## 2017-08-24 ENCOUNTER — Emergency Department (HOSPITAL_COMMUNITY): Payer: Medicare Other

## 2017-08-24 DIAGNOSIS — W01198A Fall on same level from slipping, tripping and stumbling with subsequent striking against other object, initial encounter: Secondary | ICD-10-CM | POA: Insufficient documentation

## 2017-08-24 DIAGNOSIS — S80211A Abrasion, right knee, initial encounter: Secondary | ICD-10-CM | POA: Diagnosis not present

## 2017-08-24 DIAGNOSIS — W19XXXA Unspecified fall, initial encounter: Secondary | ICD-10-CM

## 2017-08-24 DIAGNOSIS — M25461 Effusion, right knee: Secondary | ICD-10-CM | POA: Diagnosis not present

## 2017-08-24 DIAGNOSIS — Z9861 Coronary angioplasty status: Secondary | ICD-10-CM | POA: Insufficient documentation

## 2017-08-24 DIAGNOSIS — Z9049 Acquired absence of other specified parts of digestive tract: Secondary | ICD-10-CM | POA: Insufficient documentation

## 2017-08-24 DIAGNOSIS — M25561 Pain in right knee: Secondary | ICD-10-CM | POA: Diagnosis not present

## 2017-08-24 DIAGNOSIS — S8990XA Unspecified injury of unspecified lower leg, initial encounter: Secondary | ICD-10-CM | POA: Diagnosis not present

## 2017-08-24 DIAGNOSIS — Z7982 Long term (current) use of aspirin: Secondary | ICD-10-CM | POA: Insufficient documentation

## 2017-08-24 DIAGNOSIS — I2510 Atherosclerotic heart disease of native coronary artery without angina pectoris: Secondary | ICD-10-CM | POA: Diagnosis not present

## 2017-08-24 DIAGNOSIS — Z79899 Other long term (current) drug therapy: Secondary | ICD-10-CM | POA: Diagnosis not present

## 2017-08-24 DIAGNOSIS — Z87891 Personal history of nicotine dependence: Secondary | ICD-10-CM | POA: Insufficient documentation

## 2017-08-24 DIAGNOSIS — F419 Anxiety disorder, unspecified: Secondary | ICD-10-CM | POA: Diagnosis not present

## 2017-08-24 DIAGNOSIS — F329 Major depressive disorder, single episode, unspecified: Secondary | ICD-10-CM | POA: Insufficient documentation

## 2017-08-24 DIAGNOSIS — E119 Type 2 diabetes mellitus without complications: Secondary | ICD-10-CM | POA: Diagnosis not present

## 2017-08-24 DIAGNOSIS — G8911 Acute pain due to trauma: Secondary | ICD-10-CM | POA: Diagnosis not present

## 2017-08-24 MED ORDER — ACETAMINOPHEN 500 MG PO TABS
1000.0000 mg | ORAL_TABLET | Freq: Once | ORAL | Status: AC
  Administered 2017-08-24: 1000 mg via ORAL
  Filled 2017-08-24: qty 2

## 2017-08-24 NOTE — ED Provider Notes (Signed)
[facility] DEPT Provider Note   CSN: [id number] Arrival date & time: 08/24/17  1117     History   Chief Complaint Chief Complaint  Patient presents with  . Knee Pain    Rt  . Fall    HPI Melinda [name] is a 62 y.o. female is here for evaluation of sudden onset right knee pain to the medial aspect of the knee associated with small abrasion and swelling that occurred a PTA. Patient states that she was walking down a ramp when she slipped and fell forward, bystanders saw her foot bent sideways and laterally from her body, she landed directly on her knee. Reports long history of knee problems that she was initially told likely would need surgery however has been trying to wait this out. No injuries to this joint in the past.No prodromal symptoms to the fall like dizziness, lightheadedness, chest pain. She denies numbness or tingling distally. No head trauma or LOC. She has history of chronic low back pain and has a internal fentanyl pump with baclofen and bupivacaine that provides pain control from the waist below. She has pain management at [facility] with Dr. Francoise Schaumann.  HPI  Past Medical History:  Diagnosis Date  . Anemia    years ago  . Anxiety   . Blue toes    2nd toe on right foot, will get appt.  . Bulging disc   . CAD (coronary artery disease) 2009   s/p stent to LAD  . 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 Chicken pox   . Coronary arteritis   . Degenerative disc disease   . Depression   . Diabetes mellitus without complication ([place])   . Facet joint disease   . Fibromyalgia   . Heart disease   . Hyperlipidemia   . IBS (irritable bowel syndrome)   . MCL deficiency, knee   . MRSA (methicillin resistant staph aureus) culture positive 2011   GREAT TOE RIGHT FOOT  . Neuropathy 04/18/2010  . Peripheral neuropathy   . Sleep apnea    uses CPAP, sleep study at [facility]  . Spinal stenosis     Patient Active Problem  List   Diagnosis Date Noted  . Other secondary hypertension 08/09/2017  . OSA (obstructive sleep apnea) 08/09/2017  . Osteomyelitis ([place]) 02/22/2017  . Osteoarthritis of knee (Bilateral) (R>L) 06/08/2016  . Long term current use of opiate analgesic 04/21/2016  . Long term prescription opiate use 04/21/2016  . Opiate use 04/21/2016  . Encounter for adjustment or management of infusion pump 04/21/2016  . Osteoarthritis, multiple sites 04/21/2016  . Musculoskeletal pain 04/21/2016  . Anxiety and depression 09/11/2015  . Opioid-induced constipation (OIC) 07/27/2015  . Chronic knee pain (Location of Primary Source of Pain) (Right) 07/27/2015  . Osteoarthritis of knee (Left) 07/27/2015  . 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 Chronic neck pain 05/24/2015  . Chronic low back pain (Bilateral) (L>R) 05/24/2015  . Epidural fibrosis 05/24/2015  . Cervical spondylosis 05/24/2015  . Neuropathic pain 05/24/2015  . Fibromyalgia 05/24/2015  . Presence of functional implant (Medtronic programmable intrathecal pump) (Right abdominal area) 05/24/2015  . Presence of intrathecal pump (Medtronic intrathecal programmable pump) (40 mL pump) 05/24/2015  . Great toe amputation status ([place]) 10/25/2014  . DM2 (diabetes mellitus, type 2) ([place]) 04/27/2014  . Pure hypercholesterolemia 03/02/2014  . Obesity (BMI 30-39.9) 10/20/2013  . CAD (coronary artery disease) 10/04/2013  .  Chronic pain syndrome 04/18/2010    Past Surgical History:  Procedure Laterality Date  . ABLATION     UTERUS  . ABLATION     HEART  . AMPUTATION TOE Right 02/01/2017   Procedure: AMPUTATION TOE-RIGHT 2ND MPJ;  Surgeon: Albertine Patricia, DPM;  Location: [facility] ORS;  Service: Podiatry;  Laterality: Right;  . ANTERIOR CERVICAL DECOMP/DISCECTOMY FUSION N/A 07/28/2014   Procedure: ANTERIOR CERVICAL DECOMPRESSION/DISCECTOMY FUSION CERVICAL 3-4,4-5,5-6 LEVELS WITH INSTRUMENTATION AND  ALLOGRAFT;  Surgeon: Sinclair Ship, MD;  Location: [facility];  Service: Orthopedics;  Laterality: N/A;  Anterior cervical decompression fusion, cervical 3-4, cervical 4-5, cervical 5-6 with instrumentation and allograft  . BACK SURGERY     X 3 1979, 1994, 1995  . CARPAL TUNNEL RELEASE Right   . CHOLECYSTECTOMY  2003  . CORONARY ANGIOPLASTY    . EYE SURGERY Bilateral 2013   Eyelid lift   . FOOT SURGERY Right    BIG TOE  . FOOT SURGERY Bilateral    PLANTAR FASCIITIS  . FOOT SURGERY Right    2ND TOE  . GALLBLADDER SURGERY    . HAND SURGERY Left   . HAND SURGEY Left   . HEART STENT  2009   LAD  . INFUSION PUMP IMPLANTATION     X2 with morphine and baclofen  . IRRIGATION AND DEBRIDEMENT FOOT Right 02/23/2017   Procedure: IRRIGATION AND DEBRIDEMENT FOOT-right foot;  Surgeon: Samara Deist, DPM;  Location: [facility] ORS;  Service: Podiatry;  Laterality: Right;  . TOE SURGERY     then revision 8/18    OB History    No data available       Home Medications    Prior to Admission medications   Medication Sig Start Date End Date Taking? Authorizing Provider  ACCU-CHEK SOFTCLIX LANCETS lancets Use to check blood sugar 2 times daily.  Dx: E11.42 01/21/17   Jearld Fenton, NP  AMBULATORY NON FORMULARY MEDICATION Medication Name: CPAP MASK OF CHOICE FOR HOME DEVICE 07/01/15   Laverle Hobby, MD  aspirin 81 MG tablet Take 81 mg by mouth daily.    [provider]  buPROPion (WELLBUTRIN XL) 150 MG 24 hr tablet Take 1 tablet (150 mg total) by mouth daily. MUST SCHEDULE ANNUAL EXAM 08/06/17   Jearld Fenton, NP  Cholecalciferol (VITAMIN D3) 2000 units TABS Take 2,000 Units by mouth daily.    [provider]  Chromium Picolinate 800 MCG TABS Take 800 mcg by mouth daily.    [provider]  Cinnamon Bark POWD Take 1 capsule by mouth daily.    [provider]  Coenzyme Q10 (CO Q 10) 100 MG CAPS Take 200 mg by mouth daily.     [provider]    doxycycline (VIBRAMYCIN) 100 MG capsule Take 1 capsule (100 mg) by mouth twice daily for 10 days. 08/14/17   Hinda Kehr, MD  glucosamine-chondroitin 500-400 MG tablet Take 1 tablet by mouth daily.     [provider]  glucose blood (ACCU-CHEK AVIVA PLUS) test strip TEST 2 TIMES DAY  **E11.9** 12/11/16   Baity, Coralie Keens, NP  glucose blood test strip 1 each by Other route. Use as instructed    [provider]  GRAPE SEED EXTRACT PO Take 1 capsule by mouth daily.    [provider]  ibuprofen (ADVIL,MOTRIN) 200 MG tablet Take 600 mg by mouth every 8 (eight) hours as needed (for pain.).    [provider]  MAGNESIUM MALATE PO Take 115  mg by mouth daily.    [provider]  meloxicam (MOBIC) 15 MG tablet Take 1 tablet (15 mg total) by mouth daily. 08/03/17 11/01/17  Vevelyn Francois, NP  Multiple Vitamin (MULTIVITAMIN) tablet Take 1 tablet by mouth daily.    [provider]  naloxone [facility]) 2 MG/2ML injection Inject 1 mL (1 mg total) into the muscle as needed (for opioid overdose). Inject content of syringe into thigh muscle. Call 911. 04/02/17   Vevelyn Francois, NP  nitroGLYCERIN (NITROSTAT) 0.4 MG SL tablet Place 1 tablet (0.4 mg total) under the tongue every 5 (five) minutes as needed for chest pain. 05/21/16   Jerline Pain, MD  Omega-3 Fatty Acids (FISH OIL) 1200 MG CAPS Take 1,200 mg by mouth daily.     [provider]  oxyCODONE (OXY IR/ROXICODONE) 5 MG immediate release tablet Take 1 tablet (5 mg total) by mouth every 8 (eight) hours as needed for severe pain. 10/02/17 11/01/17  Vevelyn Francois, NP  oxyCODONE (OXY IR/ROXICODONE) 5 MG immediate release tablet Take 1 tablet (5 mg total) by mouth every 8 (eight) hours as needed for severe pain. 09/02/17 10/02/17  Vevelyn Francois, NP  oxyCODONE (OXY IR/ROXICODONE) 5 MG immediate release tablet Take 1 tablet (5 mg total) by mouth 3 (three) times daily as needed for severe pain. 08/03/17 09/02/17  Vevelyn Francois, NP  PAIN MANAGEMENT IT PUMP REFILL 1 each by Intrathecal route once for 1 dose. Medication: PF Fentanyl 500.0 mcg/ml PF Baclofen 300.58mcg/ml PF Bupivicaine 20.0 mg/ml Total Volume: 40 ml Needed by 07-31-17 @ 1000 07/04/17 07/04/17  Vevelyn Francois, NP  PARoxetine (PAXIL) 20 MG tablet TAKE ONE-HALF OF A TABLET BY MOUTH TWICEDAILY Patient taking differently: TAKE ONE-HALF OF A TABLET BY MOUTH DAILY IN THE MORNING 06/22/16   Baity, Coralie Keens, NP  polyethylene glycol (MIRALAX / GLYCOLAX) packet Take 17 g by mouth daily as needed (for constipation.).     [provider]  pregabalin (LYRICA) 100 MG capsule Take 1 capsule (100 mg total) by mouth 3 (three) times daily. 08/03/17 11/01/17  Vevelyn Francois, NP  rosuvastatin (CRESTOR) 20 MG tablet TAKE 1 TABLET BY MOUTH DAILY 05/27/17   Jerline Pain, MD  tiZANidine (ZANAFLEX) 4 MG capsule Take 1 capsule (4 mg total) by mouth 3 (three) times daily. 08/03/17 11/01/17  Vevelyn Francois, NP  vitamin E 400 UNIT capsule Take 400 Units by mouth daily.    [provider]    Family History Family History  Problem Relation Age of Onset  . Lung cancer Mother   . Diabetes Father   . Heart disease Maternal Grandfather   . Diabetes Paternal Grandmother   . Colon cancer Paternal Grandfather   . Stroke Neg Hx     Social History Social History   Tobacco Use  . Smoking status: Former Smoker    Packs/day: 1.50    Years: 32.00    Pack years: 48.00    Types: Cigarettes    Last attempt to quit: 07/22/2007    Years since quitting: 10.1  . Smokeless tobacco: Never Used  Substance Use Topics  . Alcohol use: Yes    Alcohol/week: 0.0 oz    Comment: occ  . Drug use: No     Allergies   Patient has no known allergies.   Review of Systems Review of Systems  Musculoskeletal: Positive for arthralgias, gait problem and joint swelling.  Skin: Positive for wound.  All other  systems reviewed and are negative.    Physical Exam Updated Vital  Signs BP (!) 143/89 (BP Location: Right Arm)   Pulse 61   Temp 98.6 F (37 C) (Oral)   Resp 18   SpO2 100%   Physical Exam  Constitutional: She is oriented to person, place, and time. She appears well-developed and well-nourished. No distress.  NAD.  HENT:  Head: Normocephalic and atraumatic.  Right Ear: External ear normal.  Left Ear: External ear normal.  Nose: Nose normal.  Eyes: Conjunctivae and EOM are normal. No scleral icterus.  Neck: Normal range of motion. Neck supple.  Cardiovascular: Normal rate, regular rhythm and normal heart sounds.  No murmur heard. Pulses:      Popliteal pulses are 2+ on the right side, and 2+ on the left side.       Dorsalis pedis pulses are 2+ on the right side, and 2+ on the left side.       Posterior tibial pulses are 2+ on the right side, and 2+ on the left side.  Pulmonary/Chest: Effort normal and breath sounds normal. She has no wheezes.  Musculoskeletal: Normal range of motion. She exhibits edema and tenderness. She exhibits no deformity.  Mild edema to medial/infrapatellar region of the right knee with overlaying abrasions Patient is focal tenderness to the medial joint line, MCL and medial/infrapatellar space. Patient able to bend knee to 90 with only mild discomfort, no crepitus noted and there is normal tracking of the patella. No lateral joint line tenderness.   No bony tenderness over patella, fibular head or tibial tuberosity.   No tenderness over MCL, LCL, patellar tendon or quadriceps tendon.    Negative Lachman's. Negative posterior drawer test.  Negative ballottement test. No varus or valgus laxity.   Neurological: She is alert and oriented to person, place, and time.  Sensation to light touch intact in lower extremities bilaterally 5/5 strength with dorsiflexion and plantar flexion bilaterally   Skin: Skin is warm and dry. Capillary refill takes less than 2 seconds. Abrasion noted.  Psychiatric: She has a normal mood and affect.  Her behavior is normal. Judgment and thought content normal.  Nursing note and vitals reviewed.    ED Treatments / Results  Labs (all labs ordered are listed, but only abnormal results are displayed) Labs Reviewed - No data to display  EKG  EKG Interpretation None       Radiology Dg Knee Complete 4 Views Right  Result Date: 08/24/2017 CLINICAL DATA:  pt has chronic knee pain (rt) today fell and twisted knee. Pt has an abrasion on the anterior aspect of right knee. Pt states no previous injuries to right knee. EXAM: RIGHT KNEE - COMPLETE 4+ VIEW COMPARISON:  None. FINDINGS: No fracture or bone lesion. There is moderate narrowing of the lateral joint space compartment with mild narrowing of the patellofemoral joint space compartment. Marginal osteophytes are noted from all 3 compartments. Moderate joint effusion. Surrounding soft tissues are unremarkable. IMPRESSION: 1. No fracture or dislocation. 2. Moderate joint effusion. 3. Degenerative/arthropathic changes primarily involving the lateral and patellofemoral joint space compartments. Electronically Signed   By: Lajean Manes M.D.   On: 08/24/2017 12:39    Procedures Procedures (including critical care time)  Medications Ordered in ED Medications  acetaminophen (TYLENOL) tablet 1,000 mg (1,000 mg Oral Given 08/24/17 1208)     Initial Impression / Assessment and Plan / ED Course  I have reviewed the triage vital signs and the nursing notes.  Pertinent  labs & imaging results that were available during my care of the patient were reviewed by me and considered in my medical decision making (see chart for details).  Clinical Course as of Aug 26 607  Sat Aug 24, 2017  1252 IMPRESSION: 1. No fracture or dislocation. 2. Moderate joint effusion. 3. Degenerative/arthropathic changes primarily involving the lateral and patellofemoral joint space compartments. DG Knee Complete 4 Views Right [CG]    Clinical Course User Index [CG]  Kinnie Feil, PA-C   62 year old female here with traumatic right knee pain, swelling and abrasion after mechanical fall.On exam, extremities neurovascularly intact. She has focal tenderness and edema to the medial aspect of the knee with an abrasion. She has full passive range of motion. No obvious deformity. X-ray without fracture or dislocation but shows moderate joint effusion. She has known arthritic/degenerative changes to this joint as noted on her x-ray. She was given Tylenol in the ED and ice. She has a fentanyl pump for chronic back pain. We will provide knee sleeve, crutches and discharge her with anti-inflammatories. She has a orthopedic surgeon already that has previously recommended knee replacement in this joint, encouraged her to follow up with orthopedist within 7-10 days for reevaluation if pain persists.  Final Clinical Impressions(s) / ED Diagnoses   Final diagnoses:  Fall, initial encounter  Acute pain of right knee    ED Discharge Orders    None       Arlean Hopping 08/25/17 0881    Macarthur Critchley, MD 08/25/17 [phone]

## 2017-08-24 NOTE — ED Notes (Signed)
Abrasion to rt knee.

## 2017-08-24 NOTE — ED Notes (Signed)
Bed: WA02 Expected date:  Expected time:  Means of arrival:  Comments: 62 yo R knee pain

## 2017-08-24 NOTE — Discharge Instructions (Signed)
X-ray shows some oft tissue swelling and small amount of fluid collection. Typically this is due to a soft tissue injury these injuries are usually treated with anti-inflammatories, rest, ice, elevation. Wear your knee sleeve for compression and additional support. Take [phone] mg of Tylenol every 6-8 hours for inflammation. Rest, ice, elevate.Follow-up with your orthopedic surgeon for reevaluation if your pain persists.

## 2017-08-24 NOTE — ED Triage Notes (Addendum)
EMS states pt has chronic knee pain (rt) today fell and twisted knee. Good pulses , good cap refills. Pt has chronic back pain and has an internal Fentanyl pump located rt abd. CBG 141 140/80-76-94% RA 16

## 2017-08-28 DIAGNOSIS — L97511 Non-pressure chronic ulcer of other part of right foot limited to breakdown of skin: Secondary | ICD-10-CM | POA: Diagnosis not present

## 2017-09-11 DIAGNOSIS — M205X1 Other deformities of toe(s) (acquired), right foot: Secondary | ICD-10-CM | POA: Diagnosis not present

## 2017-09-11 DIAGNOSIS — L97511 Non-pressure chronic ulcer of other part of right foot limited to breakdown of skin: Secondary | ICD-10-CM | POA: Diagnosis not present

## 2017-09-11 DIAGNOSIS — E1142 Type 2 diabetes mellitus with diabetic polyneuropathy: Secondary | ICD-10-CM | POA: Diagnosis not present

## 2017-09-16 ENCOUNTER — Ambulatory Visit
Admission: RE | Admit: 2017-09-16 | Discharge: 2017-09-16 | Disposition: A | Discharge status: Home / Self Care | Payer: Medicare Other | Source: Ambulatory Visit | Attending: Internal Medicine | Admitting: Internal Medicine

## 2017-09-16 DIAGNOSIS — Z1231 Encounter for screening mammogram for malignant neoplasm of breast: Secondary | ICD-10-CM | POA: Diagnosis not present

## 2017-09-16 DIAGNOSIS — Z1239 Encounter for other screening for malignant neoplasm of breast: Secondary | ICD-10-CM

## 2017-09-26 ENCOUNTER — Telehealth: Payer: Self-pay | Admitting: Internal Medicine

## 2017-09-26 DIAGNOSIS — L97511 Non-pressure chronic ulcer of other part of right foot limited to breakdown of skin: Secondary | ICD-10-CM | POA: Diagnosis not present

## 2017-09-26 DIAGNOSIS — E1142 Type 2 diabetes mellitus with diabetic polyneuropathy: Secondary | ICD-10-CM | POA: Diagnosis not present

## 2017-09-26 DIAGNOSIS — M205X1 Other deformities of toe(s) (acquired), right foot: Secondary | ICD-10-CM | POA: Diagnosis not present

## 2017-09-26 NOTE — Telephone Encounter (Signed)
Placed in Baity's inbox to review

## 2017-09-26 NOTE — Telephone Encounter (Signed)
Pt having surgery 10/17/17 at [facility]. Dropped off form that has to be filled out prior. Patient requesting it to be faxed when completed.

## 2017-09-30 DIAGNOSIS — S98131A Complete traumatic amputation of one right lesser toe, initial encounter: Secondary | ICD-10-CM

## 2017-09-30 HISTORY — DX: Complete traumatic amputation of one right lesser toe, initial encounter: S98.131A

## 2017-10-01 ENCOUNTER — Other Ambulatory Visit: Payer: Self-pay | Admitting: Podiatry

## 2017-10-01 DIAGNOSIS — M205X1 Other deformities of toe(s) (acquired), right foot: Secondary | ICD-10-CM | POA: Diagnosis not present

## 2017-10-01 DIAGNOSIS — L97511 Non-pressure chronic ulcer of other part of right foot limited to breakdown of skin: Secondary | ICD-10-CM | POA: Diagnosis not present

## 2017-10-01 DIAGNOSIS — E1142 Type 2 diabetes mellitus with diabetic polyneuropathy: Secondary | ICD-10-CM | POA: Diagnosis not present

## 2017-10-02 DIAGNOSIS — Z7689 Persons encountering health services in other specified circumstances: Secondary | ICD-10-CM

## 2017-10-02 NOTE — Telephone Encounter (Signed)
Mel- I need to know what kind of surgery she is having. We will also need copy of my last note- AWV along with most recent ECG printed to send with form.

## 2017-10-03 NOTE — Telephone Encounter (Signed)
Hammer toe correction, all notes ekg etc have been faxed with clearance form

## 2017-10-10 NOTE — Discharge Instructions (Signed)
[facility] °[facility] ° °POST OPERATIVE INSTRUCTIONS FOR DR. TROXLER AND DR. FOWLER °[facility] ° ° °1. Take your medication as prescribed.  Pain medication should be taken only as needed. ° °2. Keep the dressing clean, dry and intact. ° °3. Keep your foot elevated above the heart level for the first 48 hours. ° °4. Walking to the bathroom and brief periods of walking are acceptable, unless we have instructed you to be non-weight bearing. ° °5. Always wear your post-op shoe when walking.  Always use your crutches if you are to be non-weight bearing. ° °6. Do not take a shower. Baths are permissible as long as the foot is kept out of the water.  ° °7. Every hour you are awake:  °- Bend your knee 15 times. °- Flex foot 15 times °- Massage calf 15 times ° °8. Call [facility] ([phone]) if any of the following problems occur: °- You develop a temperature or fever. °- The bandage becomes saturated with blood. °- Medication does not stop your pain. °- Injury of the foot occurs. °- Any symptoms of infection including redness, odor, or red streaks running from wound. ° ° °General Anesthesia, Adult, Care After °These instructions provide you with information about caring for yourself after your procedure. Your health care provider may also give you more specific instructions. Your treatment has been planned according to current medical practices, but problems sometimes occur. Call your health care provider if you have any problems or questions after your procedure. °What can I expect after the procedure? °After the procedure, it is common to have: °· Vomiting. °· A sore throat. °· Mental slowness. ° °It is common to feel: °· Nauseous. °· Cold or shivery. °· Sleepy. °· Tired. °· Sore or achy, even in parts of your body where you did not have surgery. ° °Follow these instructions at home: °For at least 24 hours after the procedure: °· Do not: °? Participate in  activities where you could fall or become injured. °? Drive. °? Use heavy machinery. °? Drink alcohol. °? Take sleeping pills or medicines that cause drowsiness. °? Make important decisions or sign legal documents. °? Take care of children on your own. °· Rest. °Eating and drinking °· If you vomit, drink water, juice, or soup when you can drink without vomiting. °· Drink enough fluid to keep your urine clear or pale yellow. °· Make sure you have little or no nausea before eating solid foods. °· Follow the diet recommended by your health care provider. °General instructions °· Have a responsible adult stay with you until you are awake and alert. °· Return to your normal activities as told by your health care provider. Ask your health care provider what activities are safe for you. °· Take over-the-counter and prescription medicines only as told by your health care provider. °· If you smoke, do not smoke without supervision. °· Keep all follow-up visits as told by your health care provider. This is important. °Contact a health care provider if: °· You continue to have nausea or vomiting at home, and medicines are not helpful. °· You cannot drink fluids or start eating again. °· You cannot urinate after 8-12 hours. °· You develop a skin rash. °· You have fever. °· You have increasing redness at the site of your procedure. °Get help right away if: °· You have difficulty breathing. °· You have chest pain. °· You have unexpected bleeding. °· You feel that you   are having a life-threatening or urgent problem. °This information is not intended to replace advice given to you by your health care provider. Make sure you discuss any questions you have with your health care provider. °Document Released: 09/24/2000 Document Revised: 11/21/2015 Document Reviewed: 06/02/2015 °Elsevier Interactive Patient Education © 2018 [employer] ° °

## 2017-10-14 ENCOUNTER — Other Ambulatory Visit: Payer: Self-pay

## 2017-10-14 ENCOUNTER — Encounter: Payer: Self-pay | Admitting: *Deleted

## 2017-10-14 DIAGNOSIS — E1142 Type 2 diabetes mellitus with diabetic polyneuropathy: Secondary | ICD-10-CM | POA: Diagnosis not present

## 2017-10-14 DIAGNOSIS — L97511 Non-pressure chronic ulcer of other part of right foot limited to breakdown of skin: Secondary | ICD-10-CM | POA: Diagnosis not present

## 2017-10-14 DIAGNOSIS — M205X1 Other deformities of toe(s) (acquired), right foot: Secondary | ICD-10-CM | POA: Diagnosis not present

## 2017-10-17 ENCOUNTER — Ambulatory Visit
Admission: RE | Admit: 2017-10-17 | Discharge: 2017-10-17 | Disposition: A | Discharge status: Home / Self Care | Payer: Medicare Other | Source: Ambulatory Visit | Attending: Podiatry | Admitting: Podiatry

## 2017-10-17 ENCOUNTER — Ambulatory Visit: Payer: Medicare Other | Admitting: Anesthesiology

## 2017-10-17 ENCOUNTER — Encounter
Admission: RE | Disposition: A | Discharge status: Home / Self Care | Payer: Self-pay | Source: Ambulatory Visit | Attending: Podiatry

## 2017-10-17 DIAGNOSIS — G4730 Sleep apnea, unspecified: Secondary | ICD-10-CM | POA: Insufficient documentation

## 2017-10-17 DIAGNOSIS — I10 Essential (primary) hypertension: Secondary | ICD-10-CM | POA: Diagnosis not present

## 2017-10-17 DIAGNOSIS — E11621 Type 2 diabetes mellitus with foot ulcer: Secondary | ICD-10-CM | POA: Diagnosis not present

## 2017-10-17 DIAGNOSIS — L97519 Non-pressure chronic ulcer of other part of right foot with unspecified severity: Secondary | ICD-10-CM | POA: Diagnosis not present

## 2017-10-17 DIAGNOSIS — F418 Other specified anxiety disorders: Secondary | ICD-10-CM | POA: Insufficient documentation

## 2017-10-17 DIAGNOSIS — Z9049 Acquired absence of other specified parts of digestive tract: Secondary | ICD-10-CM | POA: Diagnosis not present

## 2017-10-17 DIAGNOSIS — Z89429 Acquired absence of other toe(s), unspecified side: Secondary | ICD-10-CM | POA: Diagnosis not present

## 2017-10-17 DIAGNOSIS — M205X1 Other deformities of toe(s) (acquired), right foot: Secondary | ICD-10-CM | POA: Diagnosis not present

## 2017-10-17 DIAGNOSIS — M2041 Other hammer toe(s) (acquired), right foot: Secondary | ICD-10-CM | POA: Insufficient documentation

## 2017-10-17 DIAGNOSIS — Z79899 Other long term (current) drug therapy: Secondary | ICD-10-CM | POA: Diagnosis not present

## 2017-10-17 DIAGNOSIS — F1120 Opioid dependence, uncomplicated: Secondary | ICD-10-CM | POA: Diagnosis not present

## 2017-10-17 DIAGNOSIS — M4800 Spinal stenosis, site unspecified: Secondary | ICD-10-CM | POA: Insufficient documentation

## 2017-10-17 DIAGNOSIS — M797 Fibromyalgia: Secondary | ICD-10-CM | POA: Insufficient documentation

## 2017-10-17 DIAGNOSIS — E1140 Type 2 diabetes mellitus with diabetic neuropathy, unspecified: Secondary | ICD-10-CM | POA: Insufficient documentation

## 2017-10-17 DIAGNOSIS — Z87891 Personal history of nicotine dependence: Secondary | ICD-10-CM | POA: Insufficient documentation

## 2017-10-17 DIAGNOSIS — Z955 Presence of coronary angioplasty implant and graft: Secondary | ICD-10-CM | POA: Diagnosis not present

## 2017-10-17 DIAGNOSIS — I456 Pre-excitation syndrome: Secondary | ICD-10-CM | POA: Diagnosis not present

## 2017-10-17 DIAGNOSIS — L97511 Non-pressure chronic ulcer of other part of right foot limited to breakdown of skin: Secondary | ICD-10-CM | POA: Diagnosis not present

## 2017-10-17 DIAGNOSIS — I2510 Atherosclerotic heart disease of native coronary artery without angina pectoris: Secondary | ICD-10-CM | POA: Insufficient documentation

## 2017-10-17 DIAGNOSIS — Z7982 Long term (current) use of aspirin: Secondary | ICD-10-CM | POA: Insufficient documentation

## 2017-10-17 HISTORY — PX: HAMMER TOE SURGERY: SHX385

## 2017-10-17 HISTORY — DX: Restless legs syndrome: G25.81

## 2017-10-17 HISTORY — DX: Chronic fatigue, unspecified: R53.82

## 2017-10-17 LAB — GLUCOSE, CAPILLARY: Glucose-Capillary: 131 mg/dL — ABNORMAL HIGH (ref 65–99)

## 2017-10-17 SURGERY — CORRECTION, HAMMER TOE
Anesthesia: General | Laterality: Right | Wound class: Clean

## 2017-10-17 MED ORDER — FENTANYL CITRATE (PF) 100 MCG/2ML IJ SOLN: 25.0000 ug | INTRAMUSCULAR | Status: DC | PRN

## 2017-10-17 MED ORDER — LIDOCAINE HCL (CARDIAC) PF 100 MG/5ML IV SOSY
PREFILLED_SYRINGE | INTRAVENOUS | Status: DC | PRN
  Administered 2017-10-17: 30 mg via INTRATRACHEAL

## 2017-10-17 MED ORDER — PROPOFOL 10 MG/ML IV BOLUS
INTRAVENOUS | Status: DC | PRN
  Administered 2017-10-17: 170 mg via INTRAVENOUS

## 2017-10-17 MED ORDER — ACETAMINOPHEN 10 MG/ML IV SOLN: 1000.0000 mg | Freq: Once | INTRAVENOUS | Status: DC | PRN

## 2017-10-17 MED ORDER — DEXAMETHASONE SODIUM PHOSPHATE 4 MG/ML IJ SOLN
INTRAMUSCULAR | Status: DC | PRN
  Administered 2017-10-17: 4 mg via INTRAVENOUS

## 2017-10-17 MED ORDER — CEFAZOLIN SODIUM-DEXTROSE 2-4 GM/100ML-% IV SOLN
2.0000 g | INTRAVENOUS | Status: AC
  Administered 2017-10-17: 2 g via INTRAVENOUS

## 2017-10-17 MED ORDER — ONDANSETRON HCL 4 MG/2ML IJ SOLN: 4.0000 mg | Freq: Once | INTRAMUSCULAR | Status: DC | PRN

## 2017-10-17 MED ORDER — OXYCODONE HCL 5 MG PO TABS: 5.0000 mg | ORAL_TABLET | Freq: Once | ORAL | Status: DC | PRN

## 2017-10-17 MED ORDER — LACTATED RINGERS IV SOLN: INTRAVENOUS | Status: DC

## 2017-10-17 MED ORDER — LACTATED RINGERS IV SOLN
INTRAVENOUS | Status: DC
  Administered 2017-10-17: 07:00:00 via INTRAVENOUS

## 2017-10-17 MED ORDER — DOXYCYCLINE HYCLATE 100 MG PO CAPS: 100.0000 mg | ORAL_CAPSULE | Freq: Two times a day (BID) | ORAL | 1 refills | Status: DC

## 2017-10-17 MED ORDER — BUPIVACAINE HCL (PF) 0.5 % IJ SOLN
INTRAMUSCULAR | Status: DC | PRN
  Administered 2017-10-17: 3 mL

## 2017-10-17 MED ORDER — ONDANSETRON HCL 4 MG/2ML IJ SOLN
INTRAMUSCULAR | Status: DC | PRN
  Administered 2017-10-17: 4 mg via INTRAVENOUS

## 2017-10-17 MED ORDER — OXYCODONE HCL 5 MG/5ML PO SOLN: 5.0000 mg | Freq: Once | ORAL | Status: DC | PRN

## 2017-10-17 MED ORDER — MIDAZOLAM HCL 5 MG/5ML IJ SOLN
INTRAMUSCULAR | Status: DC | PRN
  Administered 2017-10-17: 2 mg via INTRAVENOUS

## 2017-10-17 MED ORDER — FENTANYL CITRATE (PF) 100 MCG/2ML IJ SOLN
INTRAMUSCULAR | Status: DC | PRN
  Administered 2017-10-17: 25 ug via INTRAVENOUS

## 2017-10-17 MED ORDER — POVIDONE-IODINE 7.5 % EX SOLN
Freq: Once | CUTANEOUS | Status: AC
  Administered 2017-10-17: 07:00:00 via TOPICAL

## 2017-10-17 SURGICAL SUPPLY — 36 items
BANDAGE ELASTIC 4 LF NS (GAUZE/BANDAGES/DRESSINGS) ×2 IMPLANT
BLADE MINI RND TIP GREEN BEAV (BLADE) ×1 IMPLANT
BLADE OSC/SAGITTAL MD 5.5X18 (BLADE) ×1 IMPLANT
BNDG CMPR 75X41 PLY HI ABS (GAUZE/BANDAGES/DRESSINGS) ×1
BNDG CMPR MED 5X4 ELC HKLP NS (GAUZE/BANDAGES/DRESSINGS) ×1
BNDG ESMARK 4X12 TAN STRL LF (GAUZE/BANDAGES/DRESSINGS) ×2 IMPLANT
BNDG GAUZE 4.5X4.1 6PLY STRL (MISCELLANEOUS) ×2 IMPLANT
BNDG STRETCH 4X75 STRL LF (GAUZE/BANDAGES/DRESSINGS) ×2 IMPLANT
CANISTER SUCT 1200ML W/VALVE (MISCELLANEOUS) ×2 IMPLANT
COVER LIGHT HANDLE UNIVERSAL (MISCELLANEOUS) ×4 IMPLANT
CUFF TOURN SGL QUICK 18 (TOURNIQUET CUFF) ×1 IMPLANT
DRAPE FLUOR MINI C-ARM 54X84 (DRAPES) ×2 IMPLANT
DURAPREP 26ML APPLICATOR (WOUND CARE) ×2 IMPLANT
ELECT REM PT RETURN 9FT ADLT (ELECTROSURGICAL) ×2
ELECTRODE REM PT RTRN 9FT ADLT (ELECTROSURGICAL) ×1 IMPLANT
GAUZE PETRO XEROFOAM 1X8 (MISCELLANEOUS) ×2 IMPLANT
GAUZE SPONGE 4X4 12PLY STRL (GAUZE/BANDAGES/DRESSINGS) ×2 IMPLANT
GLOVE BIO SURGEON STRL SZ8 (GLOVE) ×2 IMPLANT
GOWN STRL REUS W/ TWL LRG LVL3 (GOWN DISPOSABLE) ×1 IMPLANT
GOWN STRL REUS W/ TWL XL LVL3 (GOWN DISPOSABLE) ×1 IMPLANT
GOWN STRL REUS W/TWL LRG LVL3 (GOWN DISPOSABLE) ×2
GOWN STRL REUS W/TWL XL LVL3 (GOWN DISPOSABLE) ×2
KIT TURNOVER KIT A (KITS) ×2 IMPLANT
NDL HYPO 18GX1.5 BLUNT FILL (NEEDLE) IMPLANT
NEEDLE HYPO 18GX1.5 BLUNT FILL (NEEDLE) ×2 IMPLANT
NS IRRIG 500ML POUR BTL (IV SOLUTION) ×2 IMPLANT
PACK EXTREMITY ARMC (MISCELLANEOUS) ×2 IMPLANT
RASP SM TEAR CROSS CUT (RASP) ×2 IMPLANT
SPLINT CAST 1 STEP 4X30 (MISCELLANEOUS) ×2 IMPLANT
STOCKINETTE STRL 6IN 960660 (GAUZE/BANDAGES/DRESSINGS) ×2 IMPLANT
STRAP BODY AND KNEE 60X3 (MISCELLANEOUS) ×2 IMPLANT
SUT ETHILON 4-0 (SUTURE) ×2
SUT ETHILON 4-0 FS2 18XMFL BLK (SUTURE) ×1
SUT VIC AB 4-0 FS2 27 (SUTURE) ×1 IMPLANT
SUTURE ETHLN 4-0 FS2 18XMF BLK (SUTURE) IMPLANT
SYRINGE 10CC LL (SYRINGE) ×1 IMPLANT

## 2017-10-17 NOTE — Anesthesia Preprocedure Evaluation (Signed)
Anesthesia Evaluation  Patient identified by MRN, date of birth, ID band Patient awake    Reviewed: Allergy & Precautions, NPO status , Patient's Chart, lab work & pertinent test results  History of Anesthesia Complications Negative for: history of anesthetic complications  Airway Mallampati: IV  TM Distance: >3 FB Neck ROM: Full   Comment: Large tongue Dental  (+)    Pulmonary sleep apnea and Continuous Positive Airway Pressure Ventilation , former smoker (quit 2009),    Pulmonary exam normal breath sounds clear to auscultation       Cardiovascular Exercise Tolerance: Good hypertension, + CAD (s/p stent 2008 (on ASA only))  Normal cardiovascular exam+ dysrhythmias (WPW s/p ablations)  Rhythm:Regular Rate:Normal     Neuro/Psych PSYCHIATRIC DISORDERS Anxiety Depression Spinal stenosis    GI/Hepatic negative GI ROS,   Endo/Other  diabetes, Type 2  Renal/GU negative Renal ROS     Musculoskeletal  (+) Fibromyalgia - (pain pump), narcotic dependent  Abdominal   Peds  Hematology negative hematology ROS (+)   Anesthesia Other Findings   Reproductive/Obstetrics                             Anesthesia Physical Anesthesia Plan  ASA: III  Anesthesia Plan: General   Post-op Pain Management:    Induction: Intravenous  PONV Risk Score and Plan: 2 and Ondansetron and Treatment may vary due to age or medical condition  Airway Management Planned: LMA  Additional Equipment:   Intra-op Plan:   Post-operative Plan: Extubation in OR  Informed Consent: I have reviewed the patients History and Physical, chart, labs and discussed the procedure including the risks, benefits and alternatives for the proposed anesthesia with the patient or authorized representative who has indicated his/her understanding and acceptance.     Plan Discussed with: CRNA  Anesthesia Plan Comments:          Anesthesia Quick Evaluation

## 2017-10-17 NOTE — Anesthesia Postprocedure Evaluation (Signed)
Anesthesia Post Note  Patient: Melinda [name]  Procedure(s) Performed: HAMMER TOE CORRECTION-4TH TOE (Right )  Patient location during evaluation: PACU Anesthesia Type: General Level of consciousness: awake and alert, oriented and patient cooperative Pain management: pain level controlled Vital Signs Assessment: post-procedure vital signs reviewed and stable Respiratory status: spontaneous breathing, nonlabored ventilation and respiratory function stable Cardiovascular status: blood pressure returned to baseline and stable Postop Assessment: adequate PO intake Anesthetic complications: no    Darrin Nipper

## 2017-10-17 NOTE — Op Note (Signed)
.  Operative note   Surgeon: Dr. Albertine Patricia, DPM.    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    Procedure:  Patient was brought into the OR and placed on the operating table in thesupine position. After anesthesia was obtained theright lower extremity was prepped and draped in usual sterile fashion.  Operative Report: At this time tissue directed to the dorsum of the right fourth toe where 2 semielliptical incisions were made obliquely across the PIP joint.  This ellipse of incision was made starting proximal lateral to distal medial.  The width of the incision was approximately 1 cm to allow closure of redundant skin and hold the toe in a better position once the resection was achieved.  The lip skin was then removed the extensor tendon was identified at the DIP joint level the extensor tendon was then incised transversely reflected proximally off the middle phalanx and out the proximal phalanx head.  At this point the residual middle phalanx which had been partially resected at an earlier time frame was then removed and the proximal phalanx head was removed utilizing power equipment.  This area was then checked FluoroScan adequate reduction removal was noted.  There was  an copiously irrigated the extensor tendon was then reapproximated with 3 separate simple interrupted sutures of 4-0 Vicryl.  Once another irrigation was achieved the skin was then closed utilizing combination of 4-0 simple interrupted and horizontal mattress sutures.  The toe was seen to sit in excellent position at this timeframe.  At this time a sterile compressive dressing was placed across the toe consisting of Xeroform gauze 4 x 4's Kling Kerlix.  The tourniquet was released prior to complete vascular seen return to all digits of the right foot.    Patient tolerated the procedure and anesthesia well.  Was transported from the OR to the PACU with all vital signs stable and vascular status intact. To be discharged per routine protocol.  Will follow up in approximately 5 days in the outpatient clinic.  Also start her on some oral doxycycline.

## 2017-10-17 NOTE — Anesthesia Procedure Notes (Addendum)
Procedure Name: LMA Insertion Date/Time: 10/17/2017 7:29 AM Performed by: Cameron Ali, CRNA Pre-anesthesia Checklist: Patient identified, Emergency Drugs available, Suction available, Timeout performed and Patient being monitored Patient Re-evaluated:Patient Re-evaluated prior to induction Oxygen Delivery Method: Circle system utilized Preoxygenation: Pre-oxygenation with 100% oxygen Induction Type: IV induction LMA: LMA inserted LMA Size: 3.0 Number of attempts: 1 Placement Confirmation: positive ETCO2 and breath sounds checked- equal and bilateral Tube secured with: Tape Dental Injury: Teeth and Oropharynx as per pre-operative assessment

## 2017-10-17 NOTE — H&P (Signed)
H and P has been reviewed and no changes are noted.  

## 2017-10-17 NOTE — Transfer of Care (Signed)
Immediate Anesthesia Transfer of Care Note  Patient: Melinda [name]  Procedure(s) Performed: HAMMER TOE CORRECTION-4TH TOE (Right )  Patient Location: PACU  Anesthesia Type: General  Level of Consciousness: awake, alert  and patient cooperative  Airway and Oxygen Therapy: Patient Spontanous Breathing and Patient connected to supplemental oxygen  Post-op Assessment: Post-op Vital signs reviewed, Patient's Cardiovascular Status Stable, Respiratory Function Stable, Patent Airway and No signs of Nausea or vomiting  Post-op Vital Signs: Reviewed and stable  Complications: No apparent anesthesia complications

## 2017-10-18 ENCOUNTER — Encounter: Payer: Self-pay | Admitting: Podiatry

## 2017-10-24 ENCOUNTER — Other Ambulatory Visit: Payer: Self-pay

## 2017-10-27 ENCOUNTER — Other Ambulatory Visit: Payer: Self-pay | Admitting: Cardiology

## 2017-10-30 ENCOUNTER — Encounter: Payer: Self-pay | Admitting: Nurse Practitioner

## 2017-10-30 ENCOUNTER — Ambulatory Visit: Payer: Medicare Other | Attending: Nurse Practitioner | Admitting: Nurse Practitioner

## 2017-10-30 ENCOUNTER — Other Ambulatory Visit: Payer: Self-pay

## 2017-10-30 ENCOUNTER — Ambulatory Visit
Admission: RE | Admit: 2017-10-30 | Discharge: 2017-10-30 | Disposition: A | Discharge status: Home / Self Care | Payer: Medicare Other | Source: Ambulatory Visit | Attending: Nurse Practitioner | Admitting: Nurse Practitioner

## 2017-10-30 VITALS — BP 138/82 | HR 66 | Temp 98.5°F | Resp 18 | Ht 69.0 in | Wt 225.0 lb

## 2017-10-30 DIAGNOSIS — M869 Osteomyelitis, unspecified: Secondary | ICD-10-CM | POA: Diagnosis not present

## 2017-10-30 DIAGNOSIS — Z9889 Other specified postprocedural states: Secondary | ICD-10-CM | POA: Diagnosis not present

## 2017-10-30 DIAGNOSIS — G8929 Other chronic pain: Secondary | ICD-10-CM

## 2017-10-30 DIAGNOSIS — M5030 Other cervical disc degeneration, unspecified cervical region: Secondary | ICD-10-CM | POA: Insufficient documentation

## 2017-10-30 DIAGNOSIS — M170 Bilateral primary osteoarthritis of knee: Secondary | ICD-10-CM | POA: Diagnosis not present

## 2017-10-30 DIAGNOSIS — E1169 Type 2 diabetes mellitus with other specified complication: Secondary | ICD-10-CM | POA: Insufficient documentation

## 2017-10-30 DIAGNOSIS — M150 Primary generalized (osteo)arthritis: Secondary | ICD-10-CM

## 2017-10-30 DIAGNOSIS — F419 Anxiety disorder, unspecified: Secondary | ICD-10-CM | POA: Insufficient documentation

## 2017-10-30 DIAGNOSIS — I158 Other secondary hypertension: Secondary | ICD-10-CM | POA: Diagnosis not present

## 2017-10-30 DIAGNOSIS — M25551 Pain in right hip: Secondary | ICD-10-CM | POA: Insufficient documentation

## 2017-10-30 DIAGNOSIS — Z79899 Other long term (current) drug therapy: Secondary | ICD-10-CM | POA: Diagnosis not present

## 2017-10-30 DIAGNOSIS — M542 Cervicalgia: Secondary | ICD-10-CM

## 2017-10-30 DIAGNOSIS — M25561 Pain in right knee: Secondary | ICD-10-CM | POA: Diagnosis not present

## 2017-10-30 DIAGNOSIS — Z79891 Long term (current) use of opiate analgesic: Secondary | ICD-10-CM | POA: Diagnosis not present

## 2017-10-30 DIAGNOSIS — E669 Obesity, unspecified: Secondary | ICD-10-CM | POA: Insufficient documentation

## 2017-10-30 DIAGNOSIS — M25552 Pain in left hip: Secondary | ICD-10-CM | POA: Insufficient documentation

## 2017-10-30 DIAGNOSIS — I2510 Atherosclerotic heart disease of native coronary artery without angina pectoris: Secondary | ICD-10-CM | POA: Diagnosis not present

## 2017-10-30 DIAGNOSIS — E7800 Pure hypercholesterolemia, unspecified: Secondary | ICD-10-CM | POA: Diagnosis not present

## 2017-10-30 DIAGNOSIS — G4733 Obstructive sleep apnea (adult) (pediatric): Secondary | ICD-10-CM | POA: Insufficient documentation

## 2017-10-30 DIAGNOSIS — M79605 Pain in left leg: Secondary | ICD-10-CM | POA: Insufficient documentation

## 2017-10-30 DIAGNOSIS — M79604 Pain in right leg: Secondary | ICD-10-CM | POA: Insufficient documentation

## 2017-10-30 DIAGNOSIS — Z955 Presence of coronary angioplasty implant and graft: Secondary | ICD-10-CM | POA: Insufficient documentation

## 2017-10-30 DIAGNOSIS — G9619 Other disorders of meninges, not elsewhere classified: Secondary | ICD-10-CM | POA: Diagnosis not present

## 2017-10-30 DIAGNOSIS — M159 Polyosteoarthritis, unspecified: Secondary | ICD-10-CM

## 2017-10-30 DIAGNOSIS — R200 Anesthesia of skin: Secondary | ICD-10-CM | POA: Diagnosis not present

## 2017-10-30 DIAGNOSIS — G894 Chronic pain syndrome: Secondary | ICD-10-CM

## 2017-10-30 DIAGNOSIS — M7918 Myalgia, other site: Secondary | ICD-10-CM | POA: Diagnosis not present

## 2017-10-30 DIAGNOSIS — F329 Major depressive disorder, single episode, unspecified: Secondary | ICD-10-CM | POA: Insufficient documentation

## 2017-10-30 DIAGNOSIS — Z89421 Acquired absence of other right toe(s): Secondary | ICD-10-CM | POA: Insufficient documentation

## 2017-10-30 DIAGNOSIS — M8949 Other hypertrophic osteoarthropathy, multiple sites: Secondary | ICD-10-CM

## 2017-10-30 DIAGNOSIS — Z7982 Long term (current) use of aspirin: Secondary | ICD-10-CM | POA: Diagnosis not present

## 2017-10-30 DIAGNOSIS — M47812 Spondylosis without myelopathy or radiculopathy, cervical region: Secondary | ICD-10-CM | POA: Diagnosis not present

## 2017-10-30 DIAGNOSIS — M797 Fibromyalgia: Secondary | ICD-10-CM | POA: Insufficient documentation

## 2017-10-30 DIAGNOSIS — Z87891 Personal history of nicotine dependence: Secondary | ICD-10-CM | POA: Insufficient documentation

## 2017-10-30 MED ORDER — TIZANIDINE HCL 4 MG PO TABS: 4.0000 mg | ORAL_TABLET | Freq: Three times a day (TID) | ORAL | 2 refills | Status: DC

## 2017-10-30 MED ORDER — MELOXICAM 15 MG PO TABS: 15.0000 mg | ORAL_TABLET | Freq: Every day | ORAL | 0 refills | Status: DC

## 2017-10-30 MED ORDER — OXYCODONE HCL 5 MG PO TABS: 5.0000 mg | ORAL_TABLET | Freq: Three times a day (TID) | ORAL | 0 refills | Status: DC | PRN

## 2017-10-30 MED ORDER — TIZANIDINE HCL 4 MG PO CAPS: 4.0000 mg | ORAL_CAPSULE | Freq: Three times a day (TID) | ORAL | 0 refills | Status: DC

## 2017-10-30 MED ORDER — PREGABALIN 100 MG PO CAPS: 100.0000 mg | ORAL_CAPSULE | Freq: Three times a day (TID) | ORAL | 0 refills | Status: DC

## 2017-10-30 NOTE — Progress Notes (Signed)
Patient's Name: Melinda [name]  MRN: [id number]  Referring Provider: Jearld Fenton, NP  DOB: [birth year]  PCP: Jearld Fenton, NP  DOS: 10/30/2017  Note by: Vevelyn Francois NP  Service setting: Ambulatory outpatient  Specialty: Interventional Pain Management  Location: [facility] (AMB) Pain Management Facility    Patient type: Established    Primary Reason(s) for Visit: Encounter for prescription drug management. (Level of risk: moderate)  CC: Hip Pain (bilateral) and Leg Pain (bilateral)  HPI  Ms. Zelenak is a 62 y.o. year old, female patient, who comes today for a medication management evaluation. She has Chronic pain syndrome; CAD (coronary artery disease); Obesity (BMI 30-39.9); Pure hypercholesterolemia; DM2 (diabetes mellitus, type 2) ([place]); Great toe amputation status ([place]); Failed back surgical syndrome; Failed cervical surgery syndrome (ACDF); Chronic neck pain; Chronic low back pain (Bilateral) (L>R); Epidural fibrosis; Cervical spondylosis; Neuropathic pain; Fibromyalgia; Presence of functional implant (Medtronic programmable intrathecal pump) (Right abdominal area); Presence of intrathecal pump (Medtronic intrathecal programmable pump) (40 mL pump); Opioid-induced constipation (OIC); Chronic knee pain (Location of Primary Source of Pain) (Right); Osteoarthritis of knee (Left); Anxiety and depression; Long term current use of opiate analgesic; Long term prescription opiate use; Opiate use; Encounter for adjustment or management of infusion pump; Osteoarthritis, multiple sites; Musculoskeletal pain; Osteoarthritis of knee (Bilateral) (R>L); Osteomyelitis ([place]); Other secondary hypertension; and OSA (obstructive sleep apnea) on their problem list. Her primarily concern today is the Hip Pain (bilateral) and Leg Pain (bilateral)  Pain Assessment: Location: Right, Left Hip Radiating: radiates into both legs to past knees  Onset: More than a month ago Duration: Chronic pain Quality: Throbbing,  Aching Severity: 4 /10 (self-reported pain score)  Note: Reported level is compatible with observation.                          Timing: Constant Modifying factors: rest  Ms. Shetterly was last scheduled for an appointment on 07/31/2017 for medication management. During today's appointment we reviewed Ms. Eppinger's chronic pain status, as well as her outpatient medication regimen. She is having left hand numbness that goes into her 4 fingers. She denies an accident or injury. She states that it maybe positional. She is SP cervical fusion in 2015. She denies and change in activity. She does not work. She feels like she has limited ROM going to the left. She is having to move her body to see people if they are on her left.  She fell on a slipper surface 08/2016. She admits that her foot slipped. She was evaluated the right knee at that time.    The patient  reports that she does not use drugs. Her body mass index is 33.23 kg/m.  Further details on both, my assessment(s), as well as the proposed treatment plan, please see below.  Controlled Substance Pharmacotherapy Assessment REMS (Risk Evaluation and Mitigation Strategy)  Analgesic:Oxycodone IR 5 mg every 8 hours (15 mg/day), in addition to the intrathecal pump medication.  MME/day:22 mg/day of oral medication   Dewayne Shorter, RN  10/30/2017  1:41 PM  Signed Nursing Pain Medication Assessment:  Safety precautions to be maintained throughout the outpatient stay will include: orient to surroundings, keep bed in low position, maintain call bell within reach at all times, provide assistance with transfer out of bed and ambulation.  Medication Inspection Compliance: Pill count conducted under aseptic conditions, in front of the patient. Neither the pills nor the bottle was removed from the patient's  sight at any time. Once count was completed pills were immediately returned to the patient in their original bottle.  Medication: Oxycodone IR Pill/Patch  Count: 12 of 90 pills remain Pill/Patch Appearance: Markings consistent with prescribed medication Bottle Appearance: Standard pharmacy container. Clearly labeled. Filled Date:04 /03 / 2019 Last Medication intake:  Today   Pharmacokinetics: Liberation and absorption (onset of action): WNL Distribution (time to peak effect): WNL Metabolism and excretion (duration of action): WNL         Pharmacodynamics: Desired effects: Analgesia: Ms. Khurana reports >50% benefit. Functional ability: Patient reports that medication allows her to accomplish basic ADLs Clinically meaningful improvement in function (CMIF): Sustained CMIF goals met Perceived effectiveness: Described as relatively effective, allowing for increase in activities of daily living (ADL) Undesirable effects: Side-effects or Adverse reactions: None reported Monitoring: [place] PMP: Online review of the past 32-monthperiod conducted. Compliant with practice rules and regulations Last UDS on record: Summary  Date Value Ref Range Status  05/02/2017 FINAL  Final    Comment:    ==================================================================== TOXASSURE SELECT 13 (MW) ==================================================================== Test                             Result       Flag       Units Drug Present and Declared for Prescription Verification   Oxycodone                      642          EXPECTED   ng/mg creat   Oxymorphone                    119          EXPECTED   ng/mg creat   Noroxycodone                   1800         EXPECTED   ng/mg creat    Sources of oxycodone include scheduled prescription medications.    Oxymorphone and noroxycodone are expected metabolites of    oxycodone. Oxymorphone is also available as a scheduled    prescription medication. Drug Present not Declared for Prescription Verification   Fentanyl                       9            UNEXPECTED ng/mg creat   Norfentanyl                    24            UNEXPECTED ng/mg creat    Source of fentanyl is a scheduled prescription medication,    including IV, patch, and transmucosal formulations. Norfentanyl    is an expected metabolite of fentanyl. ==================================================================== Test                      Result    Flag   Units      Ref Range   Creatinine              103              mg/dL      >=20 ==================================================================== Declared Medications:  The flagging and interpretation on this report are based on the  following declared medications.  Unexpected results may arise from  inaccuracies  in the declared medications.  **Note: The testing scope of this panel includes these medications:  Oxycodone  **Note: The testing scope of this panel does not include following  reported medications:  Acetaminophen  Aspirin (Aspirin 81)  Bupropion  Cholecalciferol  Chondroitin (Glucosamine-Chondroitin)  Chromium  Glucosamine (Glucosamine-Chondroitin)  Ibuprofen  Magnesium  Meloxicam  Multivitamin  Naloxone  Nitroglycerin  Omega-3 Fatty Acids  Paroxetine  Polyethylene Glycol  Pregabalin  Rosuvastatin  Supplement  Tizanidine  Ubiquinone (Coenzyme Q 10)  Vitamin E ==================================================================== For clinical consultation, please call [phone]. ====================================================================    UDS interpretation: Compliant          Medication Assessment Form: Reviewed. Patient indicates being compliant with therapy Treatment compliance: Compliant Risk Assessment Profile: Aberrant behavior: See prior evaluations. None observed or detected today Comorbid factors increasing risk of overdose: See prior notes. No additional risks detected today Risk of substance use disorder (SUD): Low Opioid Risk Tool - 10/30/17 1338      Family History of Substance Abuse   Alcohol  Positive Female     Illegal Drugs  Negative    Rx Drugs  Negative      Personal History of Substance Abuse   Alcohol  Negative    Illegal Drugs  Negative    Rx Drugs  Negative      Age   Age between 90-45 years   No      History of Preadolescent Sexual Abuse   History of Preadolescent Sexual Abuse  Negative or Female      Psychological Disease   Psychological Disease  Negative    Depression  Negative      Total Score   Opioid Risk Tool Scoring  1    Opioid Risk Interpretation  Low Risk      ORT Scoring interpretation table:  Score <3 = Low Risk for SUD  Score between 4-7 = Moderate Risk for SUD  Score >8 = High Risk for Opioid Abuse   Risk Mitigation Strategies:  Patient Counseling: Covered Patient-Prescriber Agreement (PPA): Present and active  Notification to other healthcare providers: Done  Pharmacologic Plan: No change in therapy, at this time.             Laboratory Chemistry  Inflammation Markers (CRP: Acute Phase) (ESR: Chronic Phase) Lab Results  Component Value Date   CRP 0.2 09/18/2010   ESRSEDRATE 16 09/18/2010   LATICACIDVEN 0.8 08/14/2017                         Rheumatology Markers No results found for: RF, ANA, LABURIC, URICUR, LYMEIGGIGMAB, [facility]                      Renal Function Markers Lab Results  Component Value Date   BUN 24 (H) 08/14/2017   CREATININE 0.99 08/14/2017   GFRAA >60 08/14/2017   GFRNONAA >60 08/14/2017                              Hepatic Function Markers Lab Results  Component Value Date   AST 26 08/14/2017   ALT 23 08/14/2017   ALBUMIN 4.3 08/14/2017   ALKPHOS 105 08/14/2017   HCVAB NEGATIVE 05/18/2016   LIPASE 11 10/04/2013                        Electrolytes Lab Results  Component Value Date  NA 141 08/14/2017   K 4.6 08/14/2017   CL 105 08/14/2017   CALCIUM 9.1 08/14/2017   MG 2.1 06/30/2013                        Neuropathy Markers Lab Results  Component Value Date   VITAMINB12 319 10/04/2013   FOLATE >20.0  10/04/2013   HGBA1C 6.4 08/06/2017   HIV Non Reactive 02/22/2017                        Bone Pathology Markers Lab Results  Component Value Date   VD25OH 35.80 08/06/2017                         Coagulation Parameters Lab Results  Component Value Date   INR 1.12 09/11/2015   LABPROT 14.6 09/11/2015   APTT 31 09/11/2015   PLT 215 08/14/2017   DDIMER 3.85 (H) 10/05/2013                        Cardiovascular Markers Lab Results  Component Value Date   TROPONINI <0.03 09/11/2015   HGB 12.6 08/14/2017   HCT 38.0 08/14/2017                         CA Markers No results found for: CEA, CA125, LABCA2                      Note: Lab results reviewed.  Recent Diagnostic Imaging Results  MM SCREENING BREAST TOMO BILATERAL CLINICAL DATA:  Screening.  EXAM: DIGITAL SCREENING BILATERAL MAMMOGRAM WITH TOMO AND CAD  COMPARISON:  Previous exam(s).  ACR Breast Density Category c: The breast tissue is heterogeneously dense, which may obscure small masses.  FINDINGS: There are no findings suspicious for malignancy. Images were processed with CAD.  IMPRESSION: No mammographic evidence of malignancy. A result letter of this screening mammogram will be mailed directly to the patient.  RECOMMENDATION: Screening mammogram in one year. (Code:[id number])  BI-RADS CATEGORY  1: Negative.  Electronically Signed   By: Dorise Bullion III M.D   On: 09/16/2017 17:01  Complexity Note: Imaging results reviewed. Results shared with Ms. Chana Bode, using Layman's terms.                         Meds   Current Outpatient Medications:  .  ACCU-CHEK SOFTCLIX LANCETS lancets, Use to check blood sugar 2 times daily.  Dx: E11.42, Disp: 100 each, Rfl: 2 .  AMBULATORY NON FORMULARY MEDICATION, Medication Name: CPAP MASK OF CHOICE FOR HOME DEVICE, Disp: 1 each, Rfl: 0 .  ASHWAGANDHA PO, Take by mouth daily., Disp: , Rfl:  .  aspirin 81 MG tablet, Take 81 mg by mouth daily., Disp: , Rfl:  .   buPROPion (WELLBUTRIN XL) 150 MG 24 hr tablet, Take 1 tablet (150 mg total) by mouth daily. MUST SCHEDULE ANNUAL EXAM, Disp: 90 tablet, Rfl: 3 .  Cholecalciferol (VITAMIN D3) 2000 units TABS, Take 2,000 Units by mouth daily., Disp: , Rfl:  .  Chromium Picolinate 800 MCG TABS, Take 800 mcg by mouth daily., Disp: , Rfl:  .  Cinnamon Bark POWD, Take 1 capsule by mouth daily., Disp: , Rfl:  .  Coenzyme Q10 (CO Q 10) 100 MG CAPS, Take 200 mg by mouth daily. , Disp: ,  Rfl:  .  glucosamine-chondroitin 500-400 MG tablet, Take 1 tablet by mouth daily. , Disp: , Rfl:  .  glucose blood (ACCU-CHEK AVIVA PLUS) test strip, TEST 2 TIMES DAY  **E11.9**, Disp: 200 each, Rfl: 1 .  glucose blood test strip, 1 each by Other route. Use as instructed, Disp: , Rfl:  .  MAGNESIUM MALATE PO, Take 115 mg by mouth daily., Disp: , Rfl:  .  [START ON 11/01/2017] meloxicam (MOBIC) 15 MG tablet, Take 1 tablet (15 mg total) by mouth daily., Disp: 90 tablet, Rfl: 0 .  Multiple Vitamin (MULTIVITAMIN) tablet, Take 1 tablet by mouth daily., Disp: , Rfl:  .  naloxone (NARCAN) 2 MG/2ML injection, Inject 1 mL (1 mg total) into the muscle as needed (for opioid overdose). Inject content of syringe into thigh muscle. Call 911., Disp: 2 Syringe, Rfl: 1 .  nitroGLYCERIN (NITROSTAT) 0.4 MG SL tablet, Place 1 tablet (0.4 mg total) under the tongue every 5 (five) minutes as needed for chest pain., Disp: 25 tablet, Rfl: prn .  Omega-3 Fatty Acids (FISH OIL) 1200 MG CAPS, Take 1,200 mg by mouth daily. , Disp: , Rfl:  .  [START ON 11/01/2017] oxyCODONE (OXY IR/ROXICODONE) 5 MG immediate release tablet, Take 1 tablet (5 mg total) by mouth every 8 (eight) hours as needed for severe pain., Disp: 90 tablet, Rfl: 0 .  PARoxetine (PAXIL) 20 MG tablet, TAKE ONE-HALF OF A TABLET BY MOUTH TWICEDAILY (Patient taking differently: TAKE ONE-HALF OF A TABLET BY MOUTH DAILY IN THE MORNING), Disp: 90 tablet, Rfl: 2 .  polyethylene glycol (MIRALAX / GLYCOLAX) packet,  Take 17 g by mouth daily as needed (for constipation.). , Disp: , Rfl:  .  [START ON 11/01/2017] pregabalin (LYRICA) 100 MG capsule, Take 1 capsule (100 mg total) by mouth 3 (three) times daily., Disp: 270 capsule, Rfl: 0 .  rosuvastatin (CRESTOR) 20 MG tablet, TAKE 1 TABLET BY MOUTH DAILY, Disp: 30 tablet, Rfl: 0 .  vitamin E 400 UNIT capsule, Take 400 Units by mouth daily., Disp: , Rfl:  .  [START ON 12/31/2017] oxyCODONE (OXY IR/ROXICODONE) 5 MG immediate release tablet, Take 1 tablet (5 mg total) by mouth every 8 (eight) hours as needed for severe pain., Disp: 90 tablet, Rfl: 0 .  [START ON 12/01/2017] oxyCODONE (OXY IR/ROXICODONE) 5 MG immediate release tablet, Take 1 tablet (5 mg total) by mouth every 8 (eight) hours as needed for severe pain., Disp: 90 tablet, Rfl: 0 .  tiZANidine (ZANAFLEX) 4 MG tablet, Take 1 tablet (4 mg total) by mouth 3 (three) times daily., Disp: 90 tablet, Rfl: 2  ROS  Constitutional: Denies any fever or chills Gastrointestinal: No reported hemesis, hematochezia, vomiting, or acute GI distress Musculoskeletal: Denies any acute onset joint swelling, redness, loss of ROM, or weakness Neurological: No reported episodes of acute onset apraxia, aphasia, dysarthria, agnosia, amnesia, paralysis, loss of coordination, or loss of consciousness  Allergies  Ms. Caloca has No Known Allergies.  [place]  Drug: Ms. Italiano  reports that she does not use drugs. Alcohol:  reports that she drinks alcohol. Tobacco:  reports that she quit smoking about 10 years ago. Her smoking use included cigarettes. She has a 48.00 pack-year smoking history. She has never used smokeless tobacco. Medical:  has a past medical history of Anemia, Anxiety, Blue toes, Bulging disc, CAD (coronary artery disease) (2009), Cardiac arrhythmia due to congenital heart disease, Chicken pox, Chronic fatigue, Coronary arteritis, Degenerative disc disease, Depression, Diabetes mellitus without complication ([place]),  Facet  joint disease, Fibromyalgia, Heart disease, Hyperlipidemia, IBS (irritable bowel syndrome), MCL deficiency, knee, MRSA (methicillin resistant staph aureus) culture positive (2011), Neuropathy (04/18/2010), Peripheral neuropathy, Restless leg syndrome, Sleep apnea (08/17/2003), and Spinal stenosis. Surgical: Ms. Armas  has a past surgical history that includes Foot surgery (Right); Hand surgery (Left); Gallbladder surgery; Ablation; Ablation; HEART STENT (2009); HAND SURGEY (Left); Foot surgery (Bilateral); Foot surgery (Right); Infusion pump implantation; Back surgery; Cholecystectomy (2003); Anterior cervical decomp/discectomy fusion (N/A, 07/28/2014); Coronary angioplasty; Carpal tunnel release (Right); Eye surgery (Bilateral, 2013); Amputation toe (Right, 02/01/2017); Irrigation and debridement foot (Right, 02/23/2017); Toe Surgery; and Hammer toe surgery (Right, 10/17/2017). Family: family history includes Colon cancer in her paternal grandfather; Diabetes in her father and paternal grandmother; Heart disease in her maternal grandfather; Lung cancer in her mother.  Constitutional Exam  General appearance: Well nourished, well developed, and well hydrated. In no apparent acute distress Vitals:   10/30/17 1330  BP: 138/82  Pulse: 66  Resp: 18  Temp: 98.5 F (36.9 C)  SpO2: 97%  Weight: 225 lb (102.1 kg)  Height: _0  (1.753 m)  Psych/Mental status: Alert, oriented x 3 (person, place, & time)       Eyes: PERLA Respiratory: No evidence of acute respiratory distress  Cervical Spine Area Exam  Skin & Axial Inspection: Well healed scar from previous spine surgery detected Alignment: Symmetrical Functional ROM: Restricted ROM      Stability: No instability detected Muscle Tone/Strength: Functionally intact. No obvious neuro-muscular anomalies detected. Sensory (Neurological): Unimpaired Palpation: Non-tender              Upper Extremity (UE) Exam    Side: Right upper extremity  Side: Left  upper extremity  Skin & Extremity Inspection: Skin color, temperature, and hair growth are WNL. No peripheral edema or cyanosis. No masses, redness, swelling, asymmetry, or associated skin lesions. No contractures.  Skin & Extremity Inspection: Skin color, temperature, and hair growth are WNL. No peripheral edema or cyanosis. No masses, redness, swelling, asymmetry, or associated skin lesions. No contractures.  Functional ROM: Unrestricted ROM          Functional ROM: Unrestricted ROM          Muscle Tone/Strength: Functionally intact. No obvious neuro-muscular anomalies detected.  Muscle Tone/Strength: Movement possible against some resistance (4/5)  Sensory (Neurological): Unimpaired          Sensory (Neurological): Unimpaired          Palpation: No palpable anomalies              Palpation: No palpable anomalies              Specialized Test(s): Deferred         Specialized Test(s): Deferred          Gait & Posture Assessment  Ambulation: Unassisted Gait: Relatively normal for age and body habitus Posture: WNL    Assessment  Primary Diagnosis & Pertinent Problem List: The primary encounter diagnosis was Cervical spondylosis. Diagnoses of Chronic neck pain, Primary osteoarthritis involving multiple joints, Chronic pain syndrome, Fibromyalgia, and Musculoskeletal pain were also pertinent to this visit.  Status Diagnosis  Worsening Worsening Controlled 1. Cervical spondylosis   2. Chronic neck pain   3. Primary osteoarthritis involving multiple joints   4. Chronic pain syndrome   5. Fibromyalgia   6. Musculoskeletal pain     Problems updated and reviewed during this visit: No problems updated. Plan of Care  Pharmacotherapy (Medications Ordered): Meds ordered this  encounter  Medications  . oxyCODONE (OXY IR/ROXICODONE) 5 MG immediate release tablet    Sig: Take 1 tablet (5 mg total) by mouth every 8 (eight) hours as needed for severe pain.    Dispense:  90 tablet    Refill:  0     Do not place this medication, or any other prescription from our practice, on "Automatic Refill". Patient may have prescription filled one day early if pharmacy is closed on scheduled refill date. Do not fill until:11/01/2017 To last until:12/01/2017    Order Specific Question:   Supervising Provider    Answer:   [name] [phone]  . pregabalin (LYRICA) 100 MG capsule    Sig: Take 1 capsule (100 mg total) by mouth 3 (three) times daily.    Dispense:  270 capsule    Refill:  0    Do not place this medication, or any other prescription from our practice, on "Automatic Refill". Patient may have prescription filled one day early if pharmacy is closed on scheduled refill date.    Order Specific Question:   Supervising Provider    Answer:   [name] [phone]  . DISCONTD: tiZANidine (ZANAFLEX) 4 MG capsule    Sig: Take 1 capsule (4 mg total) by mouth 3 (three) times daily.    Dispense:  270 capsule    Refill:  0    Do not add this medication to the electronic "Automatic Refill" notification system. Patient may have prescription filled one day early if pharmacy is closed on scheduled refill date.    Order Specific Question:   Supervising Provider    Answer:   [name] [phone]  . meloxicam (MOBIC) 15 MG tablet    Sig: Take 1 tablet (15 mg total) by mouth daily.    Dispense:  90 tablet    Refill:  0    Do not place medication on "Automatic Refill". Fill one day early if pharmacy is closed on scheduled refill date.    Order Specific Question:   Supervising Provider    Answer:   [name] [phone]  . oxyCODONE (OXY IR/ROXICODONE) 5 MG immediate release tablet    Sig: Take 1 tablet (5 mg total) by mouth every 8 (eight) hours as needed for severe pain.    Dispense:  90 tablet    Refill:  0    Do not place this medication, or any other prescription from our practice, on "Automatic Refill". Patient may have prescription filled one day early if pharmacy is closed on  scheduled refill date. Do not fill until:12/31/2017 To last until:01/30/2018    Order Specific Question:   Supervising Provider    Answer:   [name] [phone]  . oxyCODONE (OXY IR/ROXICODONE) 5 MG immediate release tablet    Sig: Take 1 tablet (5 mg total) by mouth every 8 (eight) hours as needed for severe pain.    Dispense:  90 tablet    Refill:  0    Do not place this medication, or any other prescription from our practice, on "Automatic Refill". Patient may have prescription filled one day early if pharmacy is closed on scheduled refill date. Do not fill until:12/01/2017 To last until: 12/31/2017    Order Specific Question:   Supervising Provider    Answer:   [name] [phone]  . tiZANidine (ZANAFLEX) 4 MG tablet    Sig: Take 1 tablet (4 mg total) by mouth 3 (three) times daily.    Dispense:  90 tablet  Refill:  2    Order Specific Question:   Supervising Provider    Answer:   [name] [phone]   New Prescriptions   TIZANIDINE (ZANAFLEX) 4 MG TABLET    Take 1 tablet (4 mg total) by mouth 3 (three) times daily.   Medications administered today: Miyeko C. Pina had no medications administered during this visit. Lab-work, procedure(s), and/or referral(s): Orders Placed This Encounter  Procedures  . DG Cervical Spine With Flex & Extend   Imaging and/or referral(s): None  Interventional management options: Planned, scheduled, and/or pending:  Intrathecal pump refill.    Considering:  Diagnostic Right knee genicular nerveblocks  Possible right knee genicular RFA Palliative cervical epiduralsteroid injection  Diagnostic cervical facetblock  Possible bilateral cervical facet radiofrequencyablation    Palliative PRN treatment(s):  Cervical epiduralsteroid injection  Right diagnostic genicularnerve block    Provider-requested follow-up: Return in about 3 months (around 01/30/2018) for Appointment As Scheduled, MedMgmt with Me Dionisio David).  Future Appointments  Date Time Provider [place]  11/04/2017  1:15 PM [facility]  11/26/2017  1:45 PM Vevelyn Francois, NP [facility] None  01/23/2018 11:30 AM Vevelyn Francois, NP [facility] None   Primary Care Physician: Jearld Fenton, NP Location: [facility] Outpatient Pain Management Facility Note by: Vevelyn Francois NP Date: 10/30/2017; Time: 4:14 PM  Pain Score Disclaimer: We use the NRS-11 scale. This is a self-reported, subjective measurement of pain severity with only modest accuracy. It is used primarily to identify changes within a particular patient. It must be understood that outpatient pain scales are significantly less accurate that those used for research, where they can be applied under ideal controlled circumstances with minimal exposure to variables. In reality, the score is likely to be a combination of pain intensity and pain affect, where pain affect describes the degree of emotional arousal or changes in action readiness caused by the sensory experience of pain. Factors such as social and work situation, setting, emotional state, anxiety levels, expectation, and prior pain experience may influence pain perception and show large inter-individual differences that may also be affected by time variables.  Patient instructions provided during this appointment: Patient Instructions  ____________________________________________________________________________________________  Medication Rules  Applies to: All patients receiving prescriptions (written or electronic).  Pharmacy of record: Pharmacy where electronic prescriptions will be sent. If written prescriptions are taken to a different pharmacy, please inform the nursing staff. The pharmacy listed in the electronic medical record should be the one where you would like electronic prescriptions to be sent.  Prescription refills: Only during scheduled appointments. Applies to both, written and electronic  prescriptions.  NOTE: The following applies primarily to controlled substances (Opioid* Pain Medications).   Patient's responsibilities: 1. Pain Pills: Bring all pain pills to every appointment (except for procedure appointments). 2. Pill Bottles: Bring pills in original pharmacy bottle. Always bring newest bottle. Bring bottle, even if empty. 3. Medication refills: You are responsible for knowing and keeping track of what medications you need refilled. The day before your appointment, write a list of all prescriptions that need to be refilled. Bring that list to your appointment and give it to the admitting nurse. Prescriptions will be written only during appointments. If you forget a medication, it will not be "Called in", "Faxed", or "electronically sent". You will need to get another appointment to get these prescribed. 4. Prescription Accuracy: You are responsible for carefully inspecting your prescriptions before leaving our office. Have the discharge nurse carefully go  over each prescription with you, before taking them home. Make sure that your name is accurately spelled, that your address is correct. Check the name and dose of your medication to make sure it is accurate. Check the number of pills, and the written instructions to make sure they are clear and accurate. Make sure that you are given enough medication to last until your next medication refill appointment. 5. Taking Medication: Take medication as prescribed. Never take more pills than instructed. Never take medication more frequently than prescribed. Taking less pills or less frequently is permitted and encouraged, when it comes to controlled substances (written prescriptions).  6. Inform other Doctors: Always inform, all of your healthcare providers, of all the medications you take. 7. Pain Medication from other Providers: You are not allowed to accept any additional pain medication from any other Doctor or Healthcare provider. There  are two exceptions to this rule. (see below) In the event that you require additional pain medication, you are responsible for notifying us, as stated below. 8. Medication Agreement: You are responsible for carefully reading and following our Medication Agreement. This must be signed before receiving any prescriptions from our practice. Safely store a copy of your signed Agreement. Violations to the Agreement will result in no further prescriptions. (Additional copies of our Medication Agreement are available upon request.) 9. Laws, Rules, & Regulations: All patients are expected to follow all Federal and [employer], [employer], Rules, [employer]. Ignorance of the Laws does not constitute a valid excuse. The use of any illegal substances is prohibited. 10. Adopted CDC guidelines & recommendations: Target dosing levels will be at or below 60 MME/day. Use of benzodiazepines** is not recommended.  Exceptions: There are only two exceptions to the rule of not receiving pain medications from other Healthcare Providers. 1. Exception #1 (Emergencies): In the event of an emergency (i.e.: accident requiring emergency care), you are allowed to receive additional pain medication. However, you are responsible for: As soon as you are able, call our office [phone], at any time of the day or night, and leave a message stating your name, the date and nature of the emergency, and the name and dose of the medication prescribed. In the event that your call is answered by a member of our staff, make sure to document and save the date, time, and the name of the person that took your information.  2. Exception #2 (Planned Surgery): In the event that you are scheduled by another doctor or dentist to have any type of surgery or procedure, you are allowed (for a period no longer than 30 days), to receive additional pain medication, for the acute post-op pain. However, in this case, you are responsible for picking up a copy of  our "Post-op Pain Management for Surgeons" handout, and giving it to your surgeon or dentist. This document is available at our office, and does not require an appointment to obtain it. Simply go to our office during business hours (Monday-Thursday from 8:00 AM to 4:00 PM) (Friday 8:00 AM to 12:00 Noon) or if you have a scheduled appointment with [place], prior to your surgery, and ask for it by name. In addition, you will need to provide [place] with your name, name of your surgeon, type of surgery, and date of procedure or surgery.  *Opioid medications include: morphine, codeine, oxycodone, oxymorphone, hydrocodone, hydromorphone, meperidine, tramadol, tapentadol, buprenorphine, fentanyl, methadone. **Benzodiazepine medications include: diazepam (Valium), alprazolam (Xanax), clonazepam (Klonopine), lorazepam (Ativan), clorazepate (Tranxene), chlordiazepoxide (Librium), estazolam (Prosom),  oxazepam (Serax), temazepam (Restoril), triazolam (Halcion) (Last updated: 08/29/2017) ____________________________________________________________________________________________

## 2017-10-30 NOTE — Progress Notes (Signed)
Nursing Pain Medication Assessment:  Safety precautions to be maintained throughout the outpatient stay will include: orient to surroundings, keep bed in low position, maintain call bell within reach at all times, provide assistance with transfer out of bed and ambulation.  Medication Inspection Compliance: Pill count conducted under aseptic conditions, in front of the patient. Neither the pills nor the bottle was removed from the patient's sight at any time. Once count was completed pills were immediately returned to the patient in their original bottle.  Medication: Oxycodone IR Pill/Patch Count: 12 of 90 pills remain Pill/Patch Appearance: Markings consistent with prescribed medication Bottle Appearance: Standard pharmacy container. Clearly labeled. Filled Date:04 /03 / 2019 Last Medication intake:  Today

## 2017-10-30 NOTE — Patient Instructions (Addendum)
____________________________________________________________________________________________  Medication Rules  Applies to: All patients receiving prescriptions (written or electronic).  Pharmacy of record: Pharmacy where electronic prescriptions will be sent. If written prescriptions are taken to a different pharmacy, please inform the nursing staff. The pharmacy listed in the electronic medical record should be the one where you would like electronic prescriptions to be sent.  Prescription refills: Only during scheduled appointments. Applies to both, written and electronic prescriptions.  NOTE: The following applies primarily to controlled substances (Opioid* Pain Medications).   Patient's responsibilities: 1. Pain Pills: Bring all pain pills to every appointment (except for procedure appointments). 2. Pill Bottles: Bring pills in original pharmacy bottle. Always bring newest bottle. Bring bottle, even if empty. 3. Medication refills: You are responsible for knowing and keeping track of what medications you need refilled. The day before your appointment, write a list of all prescriptions that need to be refilled. Bring that list to your appointment and give it to the admitting nurse. Prescriptions will be written only during appointments. If you forget a medication, it will not be "Called in", "Faxed", or "electronically sent". You will need to get another appointment to get these prescribed. 4. Prescription Accuracy: You are responsible for carefully inspecting your prescriptions before leaving our office. Have the discharge nurse carefully go over each prescription with you, before taking them home. Make sure that your name is accurately spelled, that your address is correct. Check the name and dose of your medication to make sure it is accurate. Check the number of pills, and the written instructions to make sure they are clear and accurate. Make sure that you are given enough medication to last  until your next medication refill appointment. 5. Taking Medication: Take medication as prescribed. Never take more pills than instructed. Never take medication more frequently than prescribed. Taking less pills or less frequently is permitted and encouraged, when it comes to controlled substances (written prescriptions).  6. Inform other Doctors: Always inform, all of your healthcare providers, of all the medications you take. 7. Pain Medication from other Providers: You are not allowed to accept any additional pain medication from any other Doctor or Healthcare provider. There are two exceptions to this rule. (see below) In the event that you require additional pain medication, you are responsible for notifying us, as stated below. 8. Medication Agreement: You are responsible for carefully reading and following our Medication Agreement. This must be signed before receiving any prescriptions from our practice. Safely store a copy of your signed Agreement. Violations to the Agreement will result in no further prescriptions. (Additional copies of our Medication Agreement are available upon request.) 9. Laws, Rules, & Regulations: All patients are expected to follow all Federal and State Laws, Statutes, Rules, & Regulations. Ignorance of the Laws does not constitute a valid excuse. The use of any illegal substances is prohibited. 10. Adopted CDC guidelines & recommendations: Target dosing levels will be at or below 60 MME/day. Use of benzodiazepines** is not recommended.  Exceptions: There are only two exceptions to the rule of not receiving pain medications from other Healthcare Providers. 1. Exception #1 (Emergencies): In the event of an emergency (i.e.: accident requiring emergency care), you are allowed to receive additional pain medication. However, you are responsible for: As soon as you are able, call our office [phone], at any time of the day or night, and leave a message stating your name, the  date and nature of the emergency, and the name and dose of the medication   prescribed. In the event that your call is answered by a member of our staff, make sure to document and save the date, time, and the name of the person that took your information.  2. Exception #2 (Planned Surgery): In the event that you are scheduled by another doctor or dentist to have any type of surgery or procedure, you are allowed (for a period no longer than 30 days), to receive additional pain medication, for the acute post-op pain. However, in this case, you are responsible for picking up a copy of our "Post-op Pain Management for Surgeons" handout, and giving it to your surgeon or dentist. This document is available at our office, and does not require an appointment to obtain it. Simply go to our office during business hours (Monday-Thursday from 8:00 AM to 4:00 PM) (Friday 8:00 AM to 12:00 Noon) or if you have a scheduled appointment with us, prior to your surgery, and ask for it by name. In addition, you will need to provide us with your name, name of your surgeon, type of surgery, and date of procedure or surgery.  *Opioid medications include: morphine, codeine, oxycodone, oxymorphone, hydrocodone, hydromorphone, meperidine, tramadol, tapentadol, buprenorphine, fentanyl, methadone. **Benzodiazepine medications include: diazepam (Valium), alprazolam (Xanax), clonazepam (Klonopine), lorazepam (Ativan), clorazepate (Tranxene), chlordiazepoxide (Librium), estazolam (Prosom), oxazepam (Serax), temazepam (Restoril), triazolam (Halcion) (Last updated: 08/29/2017) ____________________________________________________________________________________________    

## 2017-10-31 NOTE — Progress Notes (Signed)
Results were reviewed and found to be: mildly changes  No acute injury or pathology identified  Review would suggest interventional pain management techniques may be of benefit

## 2017-11-04 ENCOUNTER — Other Ambulatory Visit (INDEPENDENT_AMBULATORY_CARE_PROVIDER_SITE_OTHER): Payer: Medicare Other

## 2017-11-04 DIAGNOSIS — E11628 Type 2 diabetes mellitus with other skin complications: Secondary | ICD-10-CM | POA: Diagnosis not present

## 2017-11-04 DIAGNOSIS — E7800 Pure hypercholesterolemia, unspecified: Secondary | ICD-10-CM | POA: Diagnosis not present

## 2017-11-04 LAB — LIPID PANEL
Cholesterol: 130 mg/dL (ref 0–200)
HDL: 37.5 mg/dL — ABNORMAL LOW (ref >39.00)
NonHDL: 92.32
Total CHOL/HDL Ratio: 3
Triglycerides: 285 mg/dL — ABNORMAL HIGH (ref 0.0–149.0)
VLDL: 57 mg/dL — ABNORMAL HIGH (ref 0.0–40.0)

## 2017-11-04 LAB — BASIC METABOLIC PANEL
BUN: 30 mg/dL — ABNORMAL HIGH (ref 6–23)
CO2: 30 mEq/L (ref 19–32)
Calcium: 9.4 mg/dL (ref 8.4–10.5)
Chloride: 103 mEq/L (ref 96–112)
Creatinine, Ser: 1.1 mg/dL (ref 0.40–1.20)
GFR: 53.82 mL/min — ABNORMAL LOW (ref >60.00)
Glucose, Bld: 96 mg/dL (ref 70–99)
Potassium: 5.1 mEq/L (ref 3.5–5.1)
Sodium: 140 mEq/L (ref 135–145)

## 2017-11-04 LAB — HEMOGLOBIN A1C: Hgb A1c MFr Bld: 6.5 % (ref 4.6–6.5)

## 2017-11-04 LAB — LDL CHOLESTEROL, DIRECT: Direct LDL: 63 mg/dL

## 2017-11-04 LAB — MICROALBUMIN / CREATININE URINE RATIO
Creatinine,U: 177.3 mg/dL
Microalb Creat Ratio: 1 mg/g (ref 0.0–30.0)
Microalb, Ur: 1.7 mg/dL (ref 0.0–1.9)

## 2017-11-07 ENCOUNTER — Ambulatory Visit: Payer: Medicare Other | Admitting: Internal Medicine

## 2017-11-11 ENCOUNTER — Other Ambulatory Visit: Payer: Self-pay

## 2017-11-11 DIAGNOSIS — L97511 Non-pressure chronic ulcer of other part of right foot limited to breakdown of skin: Secondary | ICD-10-CM | POA: Diagnosis not present

## 2017-11-11 MED ORDER — PAIN MANAGEMENT IT PUMP REFILL: 1.0000 | Freq: Once | INTRATHECAL | 0 refills | Status: AC

## 2017-11-14 ENCOUNTER — Encounter: Payer: Self-pay | Admitting: Internal Medicine

## 2017-11-14 ENCOUNTER — Ambulatory Visit (INDEPENDENT_AMBULATORY_CARE_PROVIDER_SITE_OTHER): Payer: Medicare Other | Admitting: Internal Medicine

## 2017-11-14 VITALS — BP 144/82 | HR 75 | Temp 98.2°F | Wt 230.0 lb

## 2017-11-14 DIAGNOSIS — E11621 Type 2 diabetes mellitus with foot ulcer: Secondary | ICD-10-CM

## 2017-11-14 DIAGNOSIS — I2583 Coronary atherosclerosis due to lipid rich plaque: Secondary | ICD-10-CM

## 2017-11-14 DIAGNOSIS — L97509 Non-pressure chronic ulcer of other part of unspecified foot with unspecified severity: Secondary | ICD-10-CM

## 2017-11-14 DIAGNOSIS — Z89429 Acquired absence of other toe(s), unspecified side: Secondary | ICD-10-CM

## 2017-11-14 DIAGNOSIS — I2510 Atherosclerotic heart disease of native coronary artery without angina pectoris: Secondary | ICD-10-CM

## 2017-11-14 DIAGNOSIS — R600 Localized edema: Secondary | ICD-10-CM

## 2017-11-14 DIAGNOSIS — L97501 Non-pressure chronic ulcer of other part of unspecified foot limited to breakdown of skin: Secondary | ICD-10-CM | POA: Diagnosis not present

## 2017-11-14 DIAGNOSIS — R609 Edema, unspecified: Secondary | ICD-10-CM | POA: Diagnosis not present

## 2017-11-14 MED ORDER — GLIPIZIDE ER 2.5 MG PO TB24: 2.5000 mg | ORAL_TABLET | Freq: Every day | ORAL | 2 refills | Status: DC

## 2017-11-14 MED ORDER — ACCU-CHEK SOFTCLIX LANCETS MISC: 2 refills | Status: DC

## 2017-11-14 MED ORDER — GLUCOSE BLOOD VI STRP: ORAL_STRIP | 2 refills | Status: DC

## 2017-11-14 MED ORDER — GLUCOSE BLOOD VI STRP: ORAL_STRIP | 1 refills | Status: DC

## 2017-11-14 NOTE — Progress Notes (Signed)
Subjective:    Patient ID: Melinda [name], female    DOB: 1955/08/25, 62 y.o.   MRN: [id number]  HPI  Pt presents to the clinic today with c/o swelling of her bilateral ankles. She reports this started 2-3 months ago. The swelling never goes down. 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 She does have a history of DM 2 and Neuropathy, and is concerned that she may need to be on diabetic medication to keep her losing any more toes. Her last A1C was 6.5%, 10/2017. She had an echo in 2017, no evidence of CHF. She reports she had a doppler of her lower extremities 1-2 years ago that showed good blood flow.  Review of Systems      Past Medical History:  Diagnosis Date  . Anemia    years ago  . Anxiety   . Blue toes    2nd toe on right foot, will get appt.  . Bulging disc   . CAD (coronary artery disease) 2009   s/p stent to LAD  . 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  Ablations done.  Now has rare episodes  . Chicken pox   . Chronic fatigue   . Coronary arteritis   . Degenerative disc disease   . Depression   . Diabetes mellitus without complication ([place])   . Facet joint disease   . Fibromyalgia   . Heart disease   . Hyperlipidemia   . IBS (irritable bowel syndrome)   . MCL deficiency, knee   . MRSA (methicillin resistant staph aureus) culture positive 2011   GREAT TOE RIGHT FOOT  . Neuropathy 04/18/2010  . Peripheral neuropathy   . Restless leg syndrome   . Sleep apnea 08/17/2003   uses CPAP, sleep [facility] at [facility] (mild to moderate)  . Spinal stenosis     Current Outpatient Medications  Medication Sig Dispense Refill  . ACCU-CHEK SOFTCLIX LANCETS lancets Use to check blood sugar 2 times daily.  Dx: E11.42 100 each 2  . AMBULATORY NON FORMULARY MEDICATION Medication Name: CPAP MASK OF CHOICE FOR HOME DEVICE 1 each 0  . ASHWAGANDHA PO Take by mouth  daily.    Marland Kitchen aspirin 81 MG tablet Take 81 mg by mouth daily.    Marland Kitchen buPROPion (WELLBUTRIN XL) 150 MG 24 hr tablet Take 1 tablet (150 mg total) by mouth daily. MUST SCHEDULE ANNUAL EXAM 90 tablet 3  . Cholecalciferol (VITAMIN D3) 2000 units TABS Take 2,000 Units by mouth daily.    . Chromium Picolinate 800 MCG TABS Take 800 mcg by mouth daily.    Wallace Cullens POWD Take 1 capsule by mouth daily.    . Coenzyme Q10 (CO Q 10) 100 MG CAPS Take 200 mg by mouth daily.     Marland Kitchen glucosamine-chondroitin 500-400 MG tablet Take 1 tablet by mouth daily.     Marland Kitchen glucose blood (ACCU-CHEK AVIVA PLUS) test strip TEST 2 TIMES DAY  **E11.9** 200 each 1  . glucose blood test strip 1 each by Other route. Use as instructed    . MAGNESIUM MALATE PO Take 115 mg by mouth daily.    . meloxicam (MOBIC) 15 MG tablet Take 1 tablet (15 mg total) by mouth daily. 90 tablet 0  . Multiple Vitamin (MULTIVITAMIN) tablet Take 1 tablet by mouth daily.    . naloxone [facility]) 2 MG/2ML injection  Inject 1 mL (1 mg total) into the muscle as needed (for opioid overdose). Inject content of syringe into thigh muscle. Call 911. 2 Syringe 1  . nitroGLYCERIN (NITROSTAT) 0.4 MG SL tablet Place 1 tablet (0.4 mg total) under the tongue every 5 (five) minutes as needed for chest pain. 25 tablet prn  . Omega-3 Fatty Acids (FISH OIL) 1200 MG CAPS Take 1,200 mg by mouth daily.     Marland Kitchen oxyCODONE (OXY IR/ROXICODONE) 5 MG immediate release tablet Take 1 tablet (5 mg total) by mouth every 8 (eight) hours as needed for severe pain. 90 tablet 0  . [START ON 12/31/2017] oxyCODONE (OXY IR/ROXICODONE) 5 MG immediate release tablet Take 1 tablet (5 mg total) by mouth every 8 (eight) hours as needed for severe pain. 90 tablet 0  . [START ON 12/01/2017] oxyCODONE (OXY IR/ROXICODONE) 5 MG immediate release tablet Take 1 tablet (5 mg total) by mouth every 8 (eight) hours as needed for severe pain. 90 tablet 0  . PARoxetine (PAXIL) 20 MG tablet TAKE ONE-HALF OF A TABLET BY MOUTH  TWICEDAILY (Patient taking differently: TAKE ONE-HALF OF A TABLET BY MOUTH DAILY IN THE MORNING) 90 tablet 2  . polyethylene glycol (MIRALAX / GLYCOLAX) packet Take 17 g by mouth daily as needed (for constipation.).     Marland Kitchen pregabalin (LYRICA) 100 MG capsule Take 1 capsule (100 mg total) by mouth 3 (three) times daily. 270 capsule 0  . rosuvastatin (CRESTOR) 20 MG tablet TAKE 1 TABLET BY MOUTH DAILY 30 tablet 0  . tiZANidine (ZANAFLEX) 4 MG tablet Take 1 tablet (4 mg total) by mouth 3 (three) times daily. 90 tablet 2  . vitamin E 400 UNIT capsule Take 400 Units by mouth daily.     No current facility-administered medications for this visit.     No Known Allergies  Family History  Problem Relation Age of Onset  . Lung cancer Mother   . Diabetes Father   . Heart disease Maternal Grandfather   . Diabetes Paternal Grandmother   . Colon cancer Paternal Grandfather   . Stroke Neg Hx     Social History   Socioeconomic History  . Marital status: Significant Other    Spouse name: Not on file  . Number of children: Not on file  . Years of education: Not on file  . Highest education level: Not on file  Occupational History  . Not on file  Social Needs  . Financial resource strain: Not on file  . Food insecurity:    Worry: Not on file    Inability: Not on file  . Transportation needs:    Medical: Not on file    Non-medical: Not on file  Tobacco Use  . Smoking status: Former Smoker    Packs/day: 1.50    Years: 32.00    Pack years: 48.00    Types: Cigarettes    Last attempt to quit: 07/22/2007    Years since quitting: 10.3  . Smokeless tobacco: Never Used  Substance and Sexual Activity  . Alcohol use: Yes    Alcohol/week: 0.0 oz    Comment: occ - Holidays  . Drug use: No  . Sexual activity: Yes  Lifestyle  . Physical activity:    Days per week: Not on file    Minutes per session: Not on file  . Stress: Not on file  Relationships  . Social connections:    Talks on phone:  Not on file    Gets together: Not on  file    Attends religious service: Not on file    Active member of club or organization: Not on file    Attends meetings of clubs or organizations: Not on file    Relationship status: Not on file  . Intimate partner violence:    Fear of current or ex partner: Not on file    Emotionally abused: Not on file    Physically abused: Not on file    Forced sexual activity: Not on file  Other Topics Concern  . Not on file  Social History Narrative   Lives at home with a partner. Independent at baseline     Constitutional: Denies fever, malaise, fatigue, headache or abrupt weight changes.  Respiratory: Denies difficulty breathing, shortness of breath, cough or sputum production.   Cardiovascular: Pt reports swelling of BLE. Denies chest pain, chest tightness, palpitations or swelling in the hands.  Skin: Pt reports ulcer of toes, bilaterally (currently working with podiatry). Denies redness, rashes, lesions.    No other specific complaints in a complete review of systems (except as listed in HPI above).  Objective:   Physical Exam  BP (!) 144/82   Pulse 75   Temp 98.2 F (36.8 C) (Oral)   Wt 230 lb (104.3 kg)   SpO2 98%   BMI 33.97 kg/m  Wt Readings from Last 3 Encounters:  11/14/17 230 lb (104.3 kg)  10/30/17 225 lb (102.1 kg)  10/17/17 227 lb (103 kg)    General: Appears her stated age, in NAD. Skin: Toes with ulcers are wrapped with gauze, no drainage noted on dressing. Cardiovascular: Normal rate and rhythm. S1,S2 noted.  No murmur, rubs or gallops noted. Trace BLE noted. Pulmonary/Chest: Normal effort and positive vesicular breath sounds. No respiratory distress. No wheezes, rales or ronchi noted.  Musculoskeletal: No difficulty with gait.    BMET    Component Value Date/Time   NA 140 11/04/2017 1326   NA 135 (L) 06/30/2013 1203   K 5.1 11/04/2017 1326   K 4.1 06/30/2013 1203   CL 103 11/04/2017 1326   CL 102 06/30/2013 1203    CO2 30 11/04/2017 1326   CO2 32 06/30/2013 1203   GLUCOSE 96 11/04/2017 1326   GLUCOSE 143 (H) 06/30/2013 1203   BUN 30 (H) 11/04/2017 1326   BUN 19 (H) 06/30/2013 1203   CREATININE 1.10 11/04/2017 1326   CREATININE 0.89 06/30/2013 1203   CALCIUM 9.4 11/04/2017 1326   CALCIUM 9.7 06/30/2013 1203   GFRNONAA >60 08/14/2017 1950   GFRNONAA >60 06/30/2013 1203   GFRAA >60 08/14/2017 1950   GFRAA >60 06/30/2013 1203    Lipid Panel     Component Value Date/Time   CHOL 130 11/04/2017 1326   TRIG 285.0 (H) 11/04/2017 1326   HDL 37.50 (L) 11/04/2017 1326   CHOLHDL 3 11/04/2017 1326   VLDL 57.0 (H) 11/04/2017 1326   LDLCALC 62 09/11/2015 0421    CBC    Component Value Date/Time   WBC 6.2 08/14/2017 1950   RBC 4.47 08/14/2017 1950   HGB 12.6 08/14/2017 1950   HGB 14.0 11/12/2011 1136   HCT 38.0 08/14/2017 1950   HCT 40.7 11/12/2011 1136   PLT 215 08/14/2017 1950   PLT 274 11/12/2011 1136   MCV 85.0 08/14/2017 1950   MCV 86 11/12/2011 1136   MCH 28.3 08/14/2017 1950   MCHC 33.3 08/14/2017 1950   RDW 13.0 08/14/2017 1950   RDW 12.8 11/12/2011 1136   LYMPHSABS 1.8 08/14/2017 1950  LYMPHSABS 2.5 11/12/2011 1136   MONOABS 0.6 08/14/2017 1950   MONOABS 0.8 11/12/2011 1136   EOSABS 0.3 08/14/2017 1950   EOSABS 0.6 11/12/2011 1136   BASOSABS 0.0 08/14/2017 1950   BASOSABS 0.1 11/12/2011 1136    Hgb A1C Lab Results  Component Value Date   HGBA1C 6.5 11/04/2017            Assessment & Plan:   Swelling BLE:  Minimal Discussed low sodium diet Encouraged elevation Consider stopping Meloxicam to see if this improves  Non Healing Toe Ulcers, DM 2:  A1C not that high but will start Glipizide 2.5 mg daily Monitor for hypoglycemia Continue to follow with podiatry  RTC in 3 months for repeat A1C [name], NP

## 2017-11-14 NOTE — Patient Instructions (Signed)

## 2017-11-18 ENCOUNTER — Encounter: Payer: Self-pay | Admitting: Internal Medicine

## 2017-11-18 DIAGNOSIS — M205X1 Other deformities of toe(s) (acquired), right foot: Secondary | ICD-10-CM | POA: Diagnosis not present

## 2017-11-23 ENCOUNTER — Other Ambulatory Visit: Payer: Self-pay | Admitting: Cardiology

## 2017-11-26 ENCOUNTER — Encounter: Payer: Medicare Other | Admitting: Nurse Practitioner

## 2017-11-27 ENCOUNTER — Ambulatory Visit: Payer: Medicare Other | Attending: Nurse Practitioner | Admitting: Nurse Practitioner

## 2017-11-27 ENCOUNTER — Encounter: Payer: Self-pay | Admitting: Nurse Practitioner

## 2017-11-27 VITALS — BP 108/60 | HR 73 | Temp 98.4°F | Resp 16 | Ht 70.0 in | Wt 230.0 lb

## 2017-11-27 DIAGNOSIS — Z79899 Other long term (current) drug therapy: Secondary | ICD-10-CM | POA: Diagnosis not present

## 2017-11-27 DIAGNOSIS — Z9049 Acquired absence of other specified parts of digestive tract: Secondary | ICD-10-CM | POA: Insufficient documentation

## 2017-11-27 DIAGNOSIS — Z7982 Long term (current) use of aspirin: Secondary | ICD-10-CM | POA: Insufficient documentation

## 2017-11-27 DIAGNOSIS — M797 Fibromyalgia: Secondary | ICD-10-CM | POA: Insufficient documentation

## 2017-11-27 DIAGNOSIS — M5441 Lumbago with sciatica, right side: Secondary | ICD-10-CM

## 2017-11-27 DIAGNOSIS — M47812 Spondylosis without myelopathy or radiculopathy, cervical region: Secondary | ICD-10-CM | POA: Diagnosis not present

## 2017-11-27 DIAGNOSIS — M961 Postlaminectomy syndrome, not elsewhere classified: Secondary | ICD-10-CM | POA: Diagnosis not present

## 2017-11-27 DIAGNOSIS — G8929 Other chronic pain: Secondary | ICD-10-CM

## 2017-11-27 DIAGNOSIS — Z9889 Other specified postprocedural states: Secondary | ICD-10-CM | POA: Diagnosis not present

## 2017-11-27 DIAGNOSIS — Z969 Presence of functional implant, unspecified: Secondary | ICD-10-CM | POA: Insufficient documentation

## 2017-11-27 DIAGNOSIS — G894 Chronic pain syndrome: Secondary | ICD-10-CM | POA: Diagnosis not present

## 2017-11-27 NOTE — Patient Instructions (Signed)
____________________________________________________________________________________________  Medication Rules  Applies to: All patients receiving prescriptions (written or electronic).  Pharmacy of record: Pharmacy where electronic prescriptions will be sent. If written prescriptions are taken to a different pharmacy, please inform the nursing staff. The pharmacy listed in the electronic medical record should be the one where you would like electronic prescriptions to be sent.  Prescription refills: Only during scheduled appointments. Applies to both, written and electronic prescriptions.  NOTE: The following applies primarily to controlled substances (Opioid* Pain Medications).   Patient's responsibilities: 1. Pain Pills: Bring all pain pills to every appointment (except for procedure appointments). 2. Pill Bottles: Bring pills in original pharmacy bottle. Always bring newest bottle. Bring bottle, even if empty. 3. Medication refills: You are responsible for knowing and keeping track of what medications you need refilled. The day before your appointment, write a list of all prescriptions that need to be refilled. Bring that list to your appointment and give it to the admitting nurse. Prescriptions will be written only during appointments. If you forget a medication, it will not be "Called in", "Faxed", or "electronically sent". You will need to get another appointment to get these prescribed. 4. Prescription Accuracy: You are responsible for carefully inspecting your prescriptions before leaving our office. Have the discharge nurse carefully go over each prescription with you, before taking them home. Make sure that your name is accurately spelled, that your address is correct. Check the name and dose of your medication to make sure it is accurate. Check the number of pills, and the written instructions to make sure they are clear and accurate. Make sure that you are given enough medication to last  until your next medication refill appointment. 5. Taking Medication: Take medication as prescribed. Never take more pills than instructed. Never take medication more frequently than prescribed. Taking less pills or less frequently is permitted and encouraged, when it comes to controlled substances (written prescriptions).  6. Inform other Doctors: Always inform, all of your healthcare providers, of all the medications you take. 7. Pain Medication from other Providers: You are not allowed to accept any additional pain medication from any other Doctor or Healthcare provider. There are two exceptions to this rule. (see below) In the event that you require additional pain medication, you are responsible for notifying us, as stated below. 8. Medication Agreement: You are responsible for carefully reading and following our Medication Agreement. This must be signed before receiving any prescriptions from our practice. Safely store a copy of your signed Agreement. Violations to the Agreement will result in no further prescriptions. (Additional copies of our Medication Agreement are available upon request.) 9. Laws, Rules, & Regulations: All patients are expected to follow all Federal and State Laws, Statutes, Rules, & Regulations. Ignorance of the Laws does not constitute a valid excuse. The use of any illegal substances is prohibited. 10. Adopted CDC guidelines & recommendations: Target dosing levels will be at or below 60 MME/day. Use of benzodiazepines** is not recommended.  Exceptions: There are only two exceptions to the rule of not receiving pain medications from other Healthcare Providers. 1. Exception #1 (Emergencies): In the event of an emergency (i.e.: accident requiring emergency care), you are allowed to receive additional pain medication. However, you are responsible for: As soon as you are able, call our office [phone], at any time of the day or night, and leave a message stating your name, the  date and nature of the emergency, and the name and dose of the medication   prescribed. In the event that your call is answered by a member of our staff, make sure to document and save the date, time, and the name of the person that took your information.  2. Exception #2 (Planned Surgery): In the event that you are scheduled by another doctor or dentist to have any type of surgery or procedure, you are allowed (for a period no longer than 30 days), to receive additional pain medication, for the acute post-op pain. However, in this case, you are responsible for picking up a copy of our "Post-op Pain Management for Surgeons" handout, and giving it to your surgeon or dentist. This document is available at our office, and does not require an appointment to obtain it. Simply go to our office during business hours (Monday-Thursday from 8:00 AM to 4:00 PM) (Friday 8:00 AM to 12:00 Noon) or if you have a scheduled appointment with us, prior to your surgery, and ask for it by name. In addition, you will need to provide us with your name, name of your surgeon, type of surgery, and date of procedure or surgery.  *Opioid medications include: morphine, codeine, oxycodone, oxymorphone, hydrocodone, hydromorphone, meperidine, tramadol, tapentadol, buprenorphine, fentanyl, methadone. **Benzodiazepine medications include: diazepam (Valium), alprazolam (Xanax), clonazepam (Klonopine), lorazepam (Ativan), clorazepate (Tranxene), chlordiazepoxide (Librium), estazolam (Prosom), oxazepam (Serax), temazepam (Restoril), triazolam (Halcion) (Last updated: 08/29/2017) ____________________________________________________________________________________________    

## 2017-11-27 NOTE — Progress Notes (Signed)
Safety precautions to be maintained throughout the outpatient stay will include: orient to surroundings, keep bed in low position, maintain call bell within reach at all times, provide assistance with transfer out of bed and ambulation.   Intrathecal pain pump refill under sterile technique 10 ml residual wasted in sink witenessed by Alinda Sierras RN.    40.0 ml replaced short report updated and given to patient.

## 2017-11-27 NOTE — Progress Notes (Addendum)
Patient's Name: Melinda [name]  MRN: [id number]  Referring Provider: Jearld Fenton, NP  DOB: 26-Oct-1955  PCP: Jearld Fenton, NP  DOS: 11/27/2017  Note by: Dionisio David, NP  Service setting: Ambulatory outpatient  Specialty: Interventional Pain Management  Patient type: Established  Location: [facility] (AMB) Pain Management Facility  Visit type: Interventional Procedure   Primary Reason for Visit: Interventional Pain Management Treatment. CC: Generalized Body Aches (fibromyalgia) and Back Pain (lower sacral pain)  Procedure:  Intrathecal Drug Delivery System (IDDS):  Type: Reservoir Refill [phone])       Region: Abdominal Laterality: Right  Type of Pump: Medtronic Synchromed II (MRI-compatible) Delivery Route: Intrathecal Type of Pain Treated: Neuropathic/Nociceptive Primary Medication Class: Opioid/opiate  Medication, Concentration, Infusion Program, & Delivery Rate: Please see scanned programming printout.  Indications: 1. Chronic low back pain (Bilateral) (L>R)   2. Cervical spondylosis   3. Chronic pain syndrome   4. Failed cervical surgery syndrome (ACDF)   5. Presence of functional implant (Medtronic programmable intrathecal pump) (Right abdominal area)    Pain Assessment: Self-Reported Pain Score: 4 /10             Reported level is compatible with observation.        Intrathecal Pump Therapy Assessment  Manufacturer: Medtronic Synchromed II Type: Programmable Volume: 40 mL reservoir MRI compatibility: Yes   Drug content:  Primary Medication Class: Opioid Primary Medication: PF-Fentanyl Secondary Medication: see pump readout Other Medication: see pump readout   Programming:  Type: Simple continuous. See pump readout for details.   Changes:  Medication Change: None at this point Rate Change: No change in rate  Reported side-effects or adverse reactions: None reported  Effectiveness: Described as relatively effective, allowing for increase in activities of daily  living (ADL) Clinically meaningful improvement in function (CMIF): Sustained CMIF goals met  Plan: Pump refill today  Pre-op Assessment:  Melinda [name] is a 62 y.o. (year old), female patient, seen today for interventional treatment. 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 Melinda [name] has a current medication list which includes the following prescription(s): accu-chek softclix lancets, AMBULATORY NON FORMULARY MEDICATION, aspirin, bupropion, vitamin d3, chromium picolinate, cinnamon bark, co q 10, glipizide, glucosamine-chondroitin, glucose blood, magnesium malate, meloxicam, multivitamin, naloxone, nitroglycerin, fish oil, oxycodone, oxycodone, oxycodone, paroxetine, polyethylene glycol, pregabalin, rosuvastatin, tizanidine, vitamin e, and ashwagandha. Her primarily concern today is the Generalized Body Aches (fibromyalgia) and Back Pain (lower sacral pain)  Initial Vital Signs:  Pulse/HCG Rate: 73  Temp: 98.4 F (36.9 C) Resp: 16 BP: 108/60 SpO2: 95 %  BMI: Estimated body mass index is 33 kg/m as calculated from the following:   Height as of this encounter: '5\' 10"'$  (1.778 m).   Weight as of this encounter: 230 lb (104.3 kg).  Risk Assessment: Allergies: Reviewed. She has No Known Allergies.  Allergy Precautions: None required Coagulopathies: Reviewed. None identified.  Blood-thinner therapy: None at this time Active Infection(s): Reviewed. None identified. Melinda [name]  is afebrile  Site Confirmation: Melinda [name] was asked to confirm the procedure and laterality before marking the site Procedure checklist: Completed Consent: Before the procedure and  under the influence of no sedative(s), amnesic(s), or anxiolytics, the patient was informed of the treatment options, risks and possible complications. To fulfill our ethical and legal obligations, as recommended by the American Medical Association's Code of Ethics, I have informed the patient of my clinical impression; the nature and purpose of the treatment or procedure; the risks, benefits, and possible complications of the intervention; the alternatives, including doing nothing; the risk(s) and benefit(s) of the alternative treatment(s) or procedure(s); and the risk(s) and benefit(s) of doing nothing.  Melinda [name] was provided with information about the general risks and possible complications associated with most interventional procedures. These include, but are not limited to: failure to achieve desired goals, infection, bleeding, organ or nerve damage, allergic reactions, paralysis, and/or death.  In addition, she was informed of those risks and possible complications associated to this particular procedure, which include, but are not limited to: damage to the implant; failure to decrease pain; local, systemic, or serious CNS infections, intraspinal abscess with possible cord compression and paralysis, or life-threatening such as meningitis; bleeding; organ damage; nerve injury or damage with subsequent sensory, motor, and/or autonomic system dysfunction, resulting in transient or permanent pain, numbness, and/or weakness of one or several areas of the body; allergic reactions, either minor or major life-threatening, such as anaphylactic or anaphylactoid reactions.  Furthermore, Melinda [name] was informed of those risks and complications associated with the medications. These include, but are not limited to: allergic  reactions (i.e.: anaphylactic or anaphylactoid reactions); endorphine suppression; bradycardia and/or hypotension; water retention and/or peripheral vascular relaxation leading to lower extremity edema and possible stasis ulcers; respiratory depression and/or shortness of breath; decreased metabolic rate leading to weight gain; swelling or edema; medication-induced neural toxicity; particulate matter embolism and blood vessel occlusion with resultant organ, and/or nervous system infarction; and/or intrathecal granuloma formation with possible spinal cord compression and permanent paralysis.  Before refilling the pump Ms. Jhaveri was informed that some of the medications used in the devise may not be FDA approved for such use and therefore it constitutes an off-label use of the medications.  Finally, she was informed that Medicine is not an exact science; therefore, there is also the possibility of unforeseen or unpredictable risks and/or possible complications that may result in a catastrophic outcome. The patient indicated having understood very clearly. We have given the patient no guarantees and we have made no promises. Enough time was given to the patient to ask questions, all of which were answered to the patient's satisfaction. Ms. Matsuura has indicated that she wanted to continue with the procedure. Attestation: I, the ordering provider, attest that I have discussed with the patient the benefits, risks, side-effects, alternatives, likelihood of achieving goals, and potential problems during recovery for the procedure that I have provided informed consent. Date  Time: 11/27/2017 11:26 AM  Pre-Procedure Preparation:  Monitoring: As per clinic protocol. Respiration, ETCO2, SpO2, BP, heart rate and rhythm monitor placed and checked for adequate function Safety Precautions: Patient was assessed for positional comfort and pressure points before starting the procedure. Time-out: I initiated and conducted  the "Time-out" before starting the procedure, as per protocol. The patient was asked to participate by confirming the accuracy of the "Time Out" information. Verification of the correct person, site, and procedure were performed and confirmed by me, the nursing staff, and the patient. "Time-out" conducted as per Joint Commission's Universal Protocol (UP.01.01.01). Time: 1153  Description of Procedure Process:   Position: Supine Target Area: Central-port of intrathecal pump. Approach: Anterior,  90 degree angle approach. Area Prepped: Entire Area around the pump implant. Prepping solution: ChloraPrep (2% chlorhexidine gluconate and 70% isopropyl alcohol) Safety Precautions: Aspiration looking for blood return was conducted prior to all injections. At no point did we inject any substances, as a needle was being advanced. No attempts were made at seeking any paresthesias. Safe injection practices and needle disposal techniques used. Medications properly checked for expiration dates. SDV (single dose vial) medications used. Description of the Procedure: Protocol guidelines were followed. Two nurses trained to do implant refills were present during the entire procedure. The refill medication was checked by both healthcare providers as well as the patient. The patient was included in the "Time-out" to verify the medication. The patient was placed in position. The pump was identified. The area was prepped in the usual manner. The sterile template was positioned over the pump, making sure the side-port location matched that of the pump. Both, the pump and the template were held for stability. The needle provided in the Medtronic Kit was then introduced thru the center of the template and into the central port. The pump content was aspirated and discarded volume documented. The new medication was slowly infused into the pump, thru the filter, making sure to avoid overpressure of the device. The needle was then  removed and the area cleansed, making sure to leave some of the prepping solution back to take advantage of its long term bactericidal properties. The pump was interrogated and programmed to reflect the correct medication, volume, and dosage. The program was printed and taken to the physician for approval. Once checked and signed by the physician, a copy was provided to the patient and another scanned into the EMR. Vitals:   11/27/17 1122  BP: 108/60  Pulse: 73  Resp: 16  Temp: 98.4 F (36.9 C)  TempSrc: Oral  SpO2: 95%  Weight: 230 lb (104.3 kg)  Height: '5\' 10"'$  (1.778 m)    Start Time: 1154 hrs. End Time: 1202 hrs. Materials & Medications: Medtronic Refill Kit Medication(s): Please see chart orders for details.  Imaging Guidance:  Type of Imaging Technique: None used Indication(s): N/A Exposure Time: No patient exposure Contrast: None used. Fluoroscopic Guidance: N/A Ultrasound Guidance: N/A Interpretation: N/A  Antibiotic Prophylaxis:   Anti-infectives (From admission, onward)   None     Indication(s): None identified  Post-operative Assessment:  Post-procedure Vital Signs:  Pulse/HCG Rate: 73  Temp: 98.4 F (36.9 C) Resp: 16 BP: 108/60 SpO2: 95 %  EBL: None  Complications: No immediate post-treatment complications observed by team, or reported by patient.  Note: The patient tolerated the entire procedure well. A repeat set of vitals were taken after the procedure and the patient was kept under observation following institutional policy, for this type of procedure. Post-procedural neurological assessment was performed, showing return to baseline, prior to discharge. The patient was provided with post-procedure discharge instructions, including a section on how to identify potential problems. Should any problems arise concerning this procedure, the patient was given instructions to immediately contact us, at any time, without hesitation. In any case, we plan to  contact the patient by telephone for a follow-up status report regarding this interventional procedure.  Comments:  No additional relevant information.  Controlled Substance Pharmacotherapy Assessment REMS (Risk Evaluation and Mitigation Strategy)  Today the MS Roselli was concern about her recent urine completed at the primary care office. She was told that she had some kidney problems and that she would need to see if she  can discontinue the Meloxicam. She admits that she is just starting treatment for diabetes. She wonders what alternative she has.  She is also concern about the residual amount of medication left in her pump with the IT pump refills. She feels like this has been increasing over the last 2 refills. She admits that her pump was last evaluated appr. 5-6 years ago. She denies any injury , abnormal sounds or alarms with her pump. The nursing staff denies any error messages with programming.   Janett Billow, RN  11/27/2017 12:47 PM  Signed Safety precautions to be maintained throughout the outpatient stay will include: orient to surroundings, keep bed in low position, maintain call bell within reach at all times, provide assistance with transfer out of bed and ambulation.   Intrathecal pain pump refill under sterile technique 10 ml residual wasted in sink witenessed by Alinda Sierras RN.    40.0 ml replaced short report updated and given to patient.    Pharmacokinetics: Liberation and absorption (onset of action): WNL Distribution (time to peak effect): WNL Metabolism and excretion (duration of action): WNL         Pharmacodynamics: Desired effects: Analgesia: Ms. Hum reports >50% benefit. Functional ability: Patient reports that medication allows her to accomplish basic ADLs Clinically meaningful improvement in function (CMIF): Sustained CMIF goals met Perceived effectiveness: Described as relatively effective, allowing for increase in activities of daily living  (ADL) Undesirable effects: Side-effects or Adverse reactions: None reported Monitoring: [place] PMP: Online review of the past 34-monthperiod conducted. Compliant with practice rules and regulations Last UDS on record: Summary  Date Value Ref Range Status  05/02/2017 FINAL  Final    Comment:    ==================================================================== TOXASSURE SELECT 13 (MW) ==================================================================== Test                             Result       Flag       Units Drug Present and Declared for Prescription Verification   Oxycodone                      642          EXPECTED   ng/mg creat   Oxymorphone                    119          EXPECTED   ng/mg creat   Noroxycodone                   1800         EXPECTED   ng/mg creat    Sources of oxycodone include scheduled prescription medications.    Oxymorphone and noroxycodone are expected metabolites of    oxycodone. Oxymorphone is also available as a scheduled    prescription medication. Drug Present not Declared for Prescription Verification   Fentanyl                       9            UNEXPECTED ng/mg creat   Norfentanyl                    24           UNEXPECTED ng/mg creat    Source of fentanyl is a scheduled prescription medication,    including IV, patch, and transmucosal formulations.  Norfentanyl    is an expected metabolite of fentanyl. ==================================================================== Test                      Result    Flag   Units      Ref Range   Creatinine              103              mg/dL      >=20 ==================================================================== Declared Medications:  The flagging and interpretation on this report are based on the  following declared medications.  Unexpected results may arise from  inaccuracies in the declared medications.  **Note: The testing scope of this panel includes these medications:  Oxycodone  **Note:  The testing scope of this panel does not include following  reported medications:  Acetaminophen  Aspirin (Aspirin 81)  Bupropion  Cholecalciferol  Chondroitin (Glucosamine-Chondroitin)  Chromium  Glucosamine (Glucosamine-Chondroitin)  Ibuprofen  Magnesium  Meloxicam  Multivitamin  Naloxone  Nitroglycerin  Omega-3 Fatty Acids  Paroxetine  Polyethylene Glycol  Pregabalin  Rosuvastatin  Supplement  Tizanidine  Ubiquinone (Coenzyme Q 10)  Vitamin E ==================================================================== For clinical consultation, please call [phone]. ====================================================================    UDS interpretation: Compliant          Medication Assessment Form: Reviewed. Patient indicates being compliant with therapy Treatment compliance: Compliant Risk Assessment Profile: Aberrant behavior: See prior evaluations. None observed or detected today Comorbid factors increasing risk of overdose: See prior notes. No additional risks detected today Risk of substance use disorder (SUD): Low Opioid Risk Tool - 10/30/17 1338      Family History of Substance Abuse   Alcohol  Positive Female    Illegal Drugs  Negative    Rx Drugs  Negative      Personal History of Substance Abuse   Alcohol  Negative    Illegal Drugs  Negative    Rx Drugs  Negative      Age   Age between 42-45 years   No      History of Preadolescent Sexual Abuse   History of Preadolescent Sexual Abuse  Negative or Female      Psychological Disease   Psychological Disease  Negative    Depression  Negative      Total Score   Opioid Risk Tool Scoring  1    Opioid Risk Interpretation  Low Risk      ORT Scoring interpretation table:  Score <3 = Low Risk for SUD  Score between 4-7 = Moderate Risk for SUD  Score >8 = High Risk for Opioid Abuse   Risk Mitigation Strategies:  Patient Counseling: Covered Patient-Prescriber Agreement (PPA): Present and active   Notification to other healthcare providers: Done  Pharmacologic Plan: No change in therapy, at this time.              Assessment  Primary Diagnosis & Pertinent Problem List: The primary encounter diagnosis was Chronic low back pain (Bilateral) (L>R). 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  Status Diagnosis  Controlled Controlled Controlled 1. Chronic low back pain (Bilateral) (L>R)   2. Cervical spondylosis   3. Chronic pain syndrome   4. Failed cervical surgery syndrome (ACDF)   5. Presence of functional implant (Medtronic programmable intrathecal pump) (Right abdominal area)     Problems updated and reviewed  during this visit: No problems updated. Plan of Care   Will discuss with Dr Lowella Dandy on the IT pump concerns. Encouraged the use of APAP arthritis instead of NSAIDS for OA.  Imaging Orders  No imaging studies ordered today   Procedure Orders    No procedure(s) ordered today    Medications ordered for procedure: No orders of the defined types were placed in this encounter.  Medications administered: Tewana C. [place] had no medications administered during this visit.  See the medical record for exact dosing, route, and time of administration.  New Prescriptions   No medications on file   Disposition: Discharge home  Discharge Date & Time: 11/27/2017; 1246 hrs.   Physician-requested Follow-up: Return for Pump refill based on programming, Appointment As Scheduled.  Future Appointments  Date Time Provider [place]  01/23/2018 11:30 AM Vevelyn Francois, NP [facility] None  02/27/2018 11:15 AM Vevelyn Francois, NP [facility] None   Primary Care Physician: Jearld Fenton, NP Location: [facility] Outpatient Pain Management Facility Note by: Dionisio David, NP Date: 11/27/2017;  Time: 1:09 PM  Disclaimer:  Medicine is not an exact science. The only guarantee in medicine is that nothing is guaranteed. It is important to note that the decision to proceed with this intervention was based on the information collected from the patient. The Data and conclusions were drawn from the patient's questionnaire, the interview, and the physical examination. Because the information was provided in large part by the patient, it cannot be guaranteed that it has not been purposely or unconsciously manipulated. Every effort has been made to obtain as much relevant data as possible for this evaluation. It is important to note that the conclusions that lead to this procedure are derived in large part from the available data. Always take into account that the treatment will also be dependent on availability of resources and existing treatment guidelines, considered by other Pain Management Practitioners as being common knowledge and practice, at the time of the intervention. For Medico-Legal purposes, it is also important to point out that variation in procedural techniques and pharmacological choices are the acceptable norm. The indications, contraindications, technique, and results of the above procedure should only be interpreted and judged by a Board-Certified Interventional Pain Specialist with extensive familiarity and expertise in the same exact procedure and technique.

## 2017-12-04 DIAGNOSIS — L97511 Non-pressure chronic ulcer of other part of right foot limited to breakdown of skin: Secondary | ICD-10-CM | POA: Diagnosis not present

## 2017-12-04 DIAGNOSIS — M205X1 Other deformities of toe(s) (acquired), right foot: Secondary | ICD-10-CM | POA: Diagnosis not present

## 2017-12-04 DIAGNOSIS — E1142 Type 2 diabetes mellitus with diabetic polyneuropathy: Secondary | ICD-10-CM | POA: Diagnosis not present

## 2017-12-23 ENCOUNTER — Encounter: Payer: Self-pay | Admitting: Internal Medicine

## 2017-12-23 NOTE — Telephone Encounter (Signed)
Can we verify how she is taking it?

## 2017-12-23 NOTE — Telephone Encounter (Signed)
Last filled 06/2016 # 90 w/ 2 refills... Please advise as the original sig was for BID

## 2017-12-24 MED ORDER — PAROXETINE HCL 20 MG PO TABS: ORAL_TABLET | ORAL | 0 refills | Status: DC

## 2018-01-01 ENCOUNTER — Ambulatory Visit
Admission: RE | Admit: 2018-01-01 | Discharge: 2018-01-01 | Disposition: A | Discharge status: Home / Self Care | Payer: Medicare Other | Source: Ambulatory Visit | Attending: Nurse Practitioner | Admitting: Nurse Practitioner

## 2018-01-01 ENCOUNTER — Encounter: Payer: Self-pay | Admitting: Nurse Practitioner

## 2018-01-01 ENCOUNTER — Telehealth: Payer: Self-pay | Admitting: *Deleted

## 2018-01-01 ENCOUNTER — Ambulatory Visit (HOSPITAL_BASED_OUTPATIENT_CLINIC_OR_DEPARTMENT_OTHER): Payer: Medicare Other | Admitting: Nurse Practitioner

## 2018-01-01 VITALS — BP 115/67 | HR 71 | Temp 98.3°F | Resp 16 | Ht 70.0 in | Wt 230.0 lb

## 2018-01-01 DIAGNOSIS — E1142 Type 2 diabetes mellitus with diabetic polyneuropathy: Secondary | ICD-10-CM | POA: Diagnosis not present

## 2018-01-01 DIAGNOSIS — F329 Major depressive disorder, single episode, unspecified: Secondary | ICD-10-CM | POA: Insufficient documentation

## 2018-01-01 DIAGNOSIS — F419 Anxiety disorder, unspecified: Secondary | ICD-10-CM | POA: Insufficient documentation

## 2018-01-01 DIAGNOSIS — M533 Sacrococcygeal disorders, not elsewhere classified: Secondary | ICD-10-CM

## 2018-01-01 DIAGNOSIS — M25552 Pain in left hip: Secondary | ICD-10-CM | POA: Insufficient documentation

## 2018-01-01 DIAGNOSIS — Z87891 Personal history of nicotine dependence: Secondary | ICD-10-CM | POA: Diagnosis not present

## 2018-01-01 DIAGNOSIS — M5136 Other intervertebral disc degeneration, lumbar region: Secondary | ICD-10-CM | POA: Insufficient documentation

## 2018-01-01 DIAGNOSIS — G894 Chronic pain syndrome: Secondary | ICD-10-CM

## 2018-01-01 DIAGNOSIS — D649 Anemia, unspecified: Secondary | ICD-10-CM | POA: Insufficient documentation

## 2018-01-01 DIAGNOSIS — Z9049 Acquired absence of other specified parts of digestive tract: Secondary | ICD-10-CM | POA: Insufficient documentation

## 2018-01-01 DIAGNOSIS — M170 Bilateral primary osteoarthritis of knee: Secondary | ICD-10-CM | POA: Diagnosis not present

## 2018-01-01 DIAGNOSIS — M5441 Lumbago with sciatica, right side: Principal | ICD-10-CM

## 2018-01-01 DIAGNOSIS — I2510 Atherosclerotic heart disease of native coronary artery without angina pectoris: Secondary | ICD-10-CM | POA: Diagnosis not present

## 2018-01-01 DIAGNOSIS — G4733 Obstructive sleep apnea (adult) (pediatric): Secondary | ICD-10-CM | POA: Insufficient documentation

## 2018-01-01 DIAGNOSIS — G8929 Other chronic pain: Secondary | ICD-10-CM | POA: Diagnosis not present

## 2018-01-01 DIAGNOSIS — Z955 Presence of coronary angioplasty implant and graft: Secondary | ICD-10-CM | POA: Insufficient documentation

## 2018-01-01 DIAGNOSIS — Z79891 Long term (current) use of opiate analgesic: Secondary | ICD-10-CM | POA: Diagnosis not present

## 2018-01-01 DIAGNOSIS — Z79899 Other long term (current) drug therapy: Secondary | ICD-10-CM | POA: Diagnosis not present

## 2018-01-01 DIAGNOSIS — Z7984 Long term (current) use of oral hypoglycemic drugs: Secondary | ICD-10-CM | POA: Insufficient documentation

## 2018-01-01 DIAGNOSIS — M542 Cervicalgia: Secondary | ICD-10-CM | POA: Diagnosis not present

## 2018-01-01 DIAGNOSIS — M797 Fibromyalgia: Secondary | ICD-10-CM | POA: Diagnosis not present

## 2018-01-01 DIAGNOSIS — Z7982 Long term (current) use of aspirin: Secondary | ICD-10-CM | POA: Insufficient documentation

## 2018-01-01 DIAGNOSIS — E7800 Pure hypercholesterolemia, unspecified: Secondary | ICD-10-CM | POA: Diagnosis not present

## 2018-01-01 DIAGNOSIS — M545 Low back pain: Secondary | ICD-10-CM | POA: Diagnosis not present

## 2018-01-01 DIAGNOSIS — Z4681 Encounter for fitting and adjustment of insulin pump: Secondary | ICD-10-CM | POA: Diagnosis not present

## 2018-01-01 DIAGNOSIS — I158 Other secondary hypertension: Secondary | ICD-10-CM | POA: Insufficient documentation

## 2018-01-01 NOTE — Progress Notes (Signed)
Safety precautions to be maintained throughout the outpatient stay will include: orient to surroundings, keep bed in low position, maintain call bell within reach at all times, provide assistance with transfer out of bed and ambulation.  

## 2018-01-01 NOTE — Patient Instructions (Addendum)
____________________________________________________________________________________________  Medication Rules  Applies to: All patients receiving prescriptions (written or electronic).  Pharmacy of record: Pharmacy where electronic prescriptions will be sent. If written prescriptions are taken to a different pharmacy, please inform the nursing staff. The pharmacy listed in the electronic medical record should be the one where you would like electronic prescriptions to be sent.  Prescription refills: Only during scheduled appointments. Applies to both, written and electronic prescriptions.  NOTE: The following applies primarily to controlled substances (Opioid* Pain Medications).   Patient's responsibilities: 1. Pain Pills: Bring all pain pills to every appointment (except for procedure appointments). 2. Pill Bottles: Bring pills in original pharmacy bottle. Always bring newest bottle. Bring bottle, even if empty. 3. Medication refills: You are responsible for knowing and keeping track of what medications you need refilled. The day before your appointment, write a list of all prescriptions that need to be refilled. Bring that list to your appointment and give it to the admitting nurse. Prescriptions will be written only during appointments. If you forget a medication, it will not be "Called in", "Faxed", or "electronically sent". You will need to get another appointment to get these prescribed. 4. Prescription Accuracy: You are responsible for carefully inspecting your prescriptions before leaving our office. Have the discharge nurse carefully go over each prescription with you, before taking them home. Make sure that your name is accurately spelled, that your address is correct. Check the name and dose of your medication to make sure it is accurate. Check the number of pills, and the written instructions to make sure they are clear and accurate. Make sure that you are given enough medication to last  until your next medication refill appointment. 5. Taking Medication: Take medication as prescribed. Never take more pills than instructed. Never take medication more frequently than prescribed. Taking less pills or less frequently is permitted and encouraged, when it comes to controlled substances (written prescriptions).  6. Inform other Doctors: Always inform, all of your healthcare providers, of all the medications you take. 7. Pain Medication from other Providers: You are not allowed to accept any additional pain medication from any other Doctor or Healthcare provider. There are two exceptions to this rule. (see below) In the event that you require additional pain medication, you are responsible for notifying us, as stated below. 8. Medication Agreement: You are responsible for carefully reading and following our Medication Agreement. This must be signed before receiving any prescriptions from our practice. Safely store a copy of your signed Agreement. Violations to the Agreement will result in no further prescriptions. (Additional copies of our Medication Agreement are available upon request.) 9. Laws, Rules, & Regulations: All patients are expected to follow all Federal and State Laws, Statutes, Rules, & Regulations. Ignorance of the Laws does not constitute a valid excuse. The use of any illegal substances is prohibited. 10. Adopted CDC guidelines & recommendations: Target dosing levels will be at or below 60 MME/day. Use of benzodiazepines** is not recommended.  Exceptions: There are only two exceptions to the rule of not receiving pain medications from other Healthcare Providers. 1. Exception #1 (Emergencies): In the event of an emergency (i.e.: accident requiring emergency care), you are allowed to receive additional pain medication. However, you are responsible for: As soon as you are able, call our office [phone], at any time of the day or night, and leave a message stating your name, the  date and nature of the emergency, and the name and dose of the medication   prescribed. In the event that your call is answered by a member of our staff, make sure to document and save the date, time, and the name of the person that took your information.  2. Exception #2 (Planned Surgery): In the event that you are scheduled by another doctor or dentist to have any type of surgery or procedure, you are allowed (for a period no longer than 30 days), to receive additional pain medication, for the acute post-op pain. However, in this case, you are responsible for picking up a copy of our "Post-op Pain Management for Surgeons" handout, and giving it to your surgeon or dentist. This document is available at our office, and does not require an appointment to obtain it. Simply go to our office during business hours (Monday-Thursday from 8:00 AM to 4:00 PM) (Friday 8:00 AM to 12:00 Noon) or if you have a scheduled appointment with [place], prior to your surgery, and ask for it by name. In addition, you will need to provide [place] with your name, name of your surgeon, type of surgery, and date of procedure or surgery.  *Opioid medications include: morphine, codeine, oxycodone, oxymorphone, hydrocodone, hydromorphone, meperidine, tramadol, tapentadol, buprenorphine, fentanyl, methadone. **Benzodiazepine medications include: diazepam (Valium), alprazolam (Xanax), clonazepam (Klonopine), lorazepam (Ativan), clorazepate (Tranxene), chlordiazepoxide (Librium), estazolam (Prosom), oxazepam (Serax), temazepam (Restoril), triazolam (Halcion) (Last updated: 08/29/2017) ____________________________________________________________________________________________ ____________________________________________________________________________________________  Appointment Policy Summary  It is our goal and responsibility to provide the medical community with assistance in the evaluation and management of patients with chronic pain.  Unfortunately our resources are limited. Because we do not have an unlimited amount of time, or available appointments, we are required to closely monitor and manage their use. The following rules exist to maximize their use:  Patient's responsibilities: 1. Punctuality:  At what time should I arrive? You should be physically present in our office 30 minutes before your scheduled appointment. Your scheduled appointment is with your assigned healthcare provider. However, it takes 5-10 minutes to be "checked-in", and another 15 minutes for the nurses to do the admission. If you arrive to our office at the time you were given for your appointment, you will end up being at least 20-25 minutes late to your appointment with the provider. 2. Tardiness:  What happens if I arrive only a few minutes after my scheduled appointment time? You will need to reschedule your appointment. The cutoff is your appointment time. This is why it is so important that you arrive at least 30 minutes before that appointment. If you have an appointment scheduled for 10:00 AM and you arrive at 10:01, you will be required to reschedule your appointment.  3. Plan ahead:  Always assume that you will encounter traffic on your way in. Plan for it. If you are dependent on a driver, make sure they understand these rules and the need to arrive early. 4. Other appointments and responsibilities:  Avoid scheduling any other appointments before or after your pain clinic appointments.  5. Be prepared:  Write down everything that you need to discuss with your healthcare provider and give this information to the admitting nurse. Write down the medications that you will need refilled. Bring your pills and bottles (even the empty ones), to all of your appointments, except for those where a procedure is scheduled. 6. No children or pets:  Find someone to take care of them. It is not appropriate to bring them in. 7. Scheduling changes:  We request  "advanced notification" of any changes or cancellations. 8.  Advanced notification:  Defined as a time period of more than 24 hours prior to the originally scheduled appointment. This allows for the appointment to be offered to other patients. 9. Rescheduling:  When a visit is rescheduled, it will require the cancellation of the original appointment. For this reason they both fall within the category of "Cancellations".  10. Cancellations:  They require advanced notification. Any cancellation less than 24 hours before the  appointment will be recorded as a "No Show". 11. No Show:  Defined as an unkept appointment where the patient failed to notify or declare to the practice their intention or inability to keep the appointment.  Corrective process for repeat offenders:  1. Tardiness: Three (3) episodes of rescheduling due to late arrivals will be recorded as one (1) "No Show". 2. Cancellation or reschedule: Three (3) cancellations or rescheduling will be recorded as one (1) "No Show". 3. "No Shows": Three (3) "No Shows" within a 12 month period will result in discharge from the practice. ____________________________________________________________________________________________    BMI Assessment: Estimated body mass index is 33 kg/m as calculated from the following:   Height as of this encounter: 5\' 10"  (1.778 m).   Weight as of this encounter: 230 lb (104.3 kg).  BMI interpretation table: BMI level Category Range association with higher incidence of chronic pain  <18 kg/m2 Underweight   18.5-24.9 kg/m2 Ideal body weight   25-29.9 kg/m2 Overweight Increased incidence by 20%  30-34.9 kg/m2 Obese (Class I) Increased incidence by 68%  35-39.9 kg/m2 Severe obesity (Class II) Increased incidence by 136%  >40 kg/m2 Extreme obesity (Class III) Increased incidence by 254%   Patient's current BMI Ideal Body weight  Body mass index is 33 kg/m. Ideal body weight: 68.5 kg (151 lb 0.2  oz) Adjusted ideal body weight: 82.8 kg (182 lb 9.7 oz)   BMI Readings from Last 4 Encounters:  01/01/18 33.00 kg/m  11/27/17 33.00 kg/m  11/14/17 33.97 kg/m  10/30/17 33.23 kg/m   Wt Readings from Last 4 Encounters:  01/01/18 230 lb (104.3 kg)  11/27/17 230 lb (104.3 kg)  11/14/17 230 lb (104.3 kg)  10/30/17 225 lb (102.1 kg)

## 2018-01-01 NOTE — Telephone Encounter (Signed)
Spoke with Melinda [name] on the phone to let her know about her Xrays which were all negative findings.  Conveyed to Melinda [name] and she offered to prescribe a medrol dose pack.  Discussed back with Melinda [name] and she would like to speak with her partner before deciding.  She will let us know what she would like to do.

## 2018-01-01 NOTE — Progress Notes (Signed)
Results were reviewed and found to be: mildly abnormal  No acute injury or pathology identified  Review would suggest interventional pain management techniques may be of benefit 

## 2018-01-01 NOTE — Progress Notes (Signed)
Patient's Name: Melinda [name]  MRN: [id number]  Referring Provider: Jearld Fenton, NP  DOB: 1956-05-09  PCP: Jearld Fenton, NP  DOS: 01/01/2018  Note by: Vevelyn Francois NP  Service setting: Ambulatory outpatient  Specialty: Interventional Pain Management  Location: [facility] (AMB) Pain Management Facility    Patient type: Established    Primary Reason(s) for Visit: Evaluation of chronic illnesses with exacerbation, or progression (Level of risk: moderate) CC: Rectal Pain (sacrum); Shoulder Pain (bilateral); Arm Pain (bilaterral); Leg Pain (upper thigh); and Back Pain (lower bilateral )  HPI  Melinda [name] is a 62 y.o. year old, female patient, who comes today for a follow-up evaluation. She has Chronic pain syndrome; CAD (coronary artery disease); Pure hypercholesterolemia; DM2 (diabetes mellitus, type 2) ([place]); Failed back surgical syndrome; Failed cervical surgery syndrome (ACDF); Chronic neck pain; Chronic low back pain (Bilateral) (L>R); Epidural fibrosis; Cervical spondylosis; Neuropathic pain; Fibromyalgia; Presence of functional implant (Medtronic programmable intrathecal pump) (Right abdominal area); Presence of intrathecal pump (Medtronic intrathecal programmable pump) (40 mL pump); Opioid-induced constipation (OIC); Chronic knee pain (Location of Primary Source of Pain) (Right); Osteoarthritis of knee (Left); Anxiety and depression; Long term current use of opiate analgesic; Long term prescription opiate use; Opiate use; Encounter for adjustment or management of infusion pump; Osteoarthritis, multiple sites; Osteoarthritis of knee (Bilateral) (R>L); Other secondary hypertension; OSA (obstructive sleep apnea); and Hip pain, acute, left on their problem list. Melinda [name] was last seen on 11/27/2017. Her primarily concern today is the Rectal Pain (sacrum); Shoulder Pain (bilateral); Arm Pain (bilaterral); Leg Pain (upper thigh); and Back Pain (lower bilateral )  Pain Assessment: Location: Left(main  pain seems to be at the sacral area or just beside it on both sides and patient feels that it is the result of a fall) (see visit information for pain sites.) Radiating: shoulder pain going into arms. Onset: More than a month ago Duration: Chronic pain Quality: Numbness, Throbbing, Aching, Constant, Discomfort Severity: 5 /10 (subjective, self-reported pain score)  Note: Reported level is compatible with observation.                          Effect on ADL: patient wants to be active and doesn't desire to sit around but is unable to do her normal course of activity.  Timing: Constant Modifying factors: rest makes pain better BP: 115/67  HR: 71  Further details on both, my assessment(s), as well as the proposed treatment plan, please see below. She admits that she fell while she was walking looking at wood. She did not see anyone for this. She came in with a note She feels like she has had many years of pain relief on Morphine. She states that she was switched to Fentanyl and Meloxicam. She states that switching from the Meloxicam secondary to kidney function. She has been constant pain in the scam  area. She denies any leg pain. She is having hip joint. She admits that she is having generalized pain. She admits that she has the urge to pee more however is not voiding more.   Laboratory Chemistry  Inflammation Markers (CRP: Acute Phase) (ESR: Chronic Phase) Lab Results  Component Value Date   CRP 0.2 09/18/2010   ESRSEDRATE 16 09/18/2010   LATICACIDVEN 0.8 08/14/2017                         Rheumatology Markers No results found for: RF, ANA,  LABURIC, URICUR, LYMEIGGIGMAB, LYMEABIGMQN, HLAB27                      Renal Function Markers Lab Results  Component Value Date   BUN 30 (H) 11/04/2017   CREATININE 1.10 11/04/2017   GFRAA >60 08/14/2017   GFRNONAA >60 08/14/2017                             Hepatic Function Markers Lab Results  Component Value Date   AST 26 08/14/2017    ALT 23 08/14/2017   ALBUMIN 4.3 08/14/2017   ALKPHOS 105 08/14/2017   HCVAB NEGATIVE 05/18/2016   LIPASE 11 10/04/2013                        Electrolytes Lab Results  Component Value Date   NA 140 11/04/2017   K 5.1 11/04/2017   CL 103 11/04/2017   CALCIUM 9.4 11/04/2017   MG 2.1 06/30/2013                        Neuropathy Markers Lab Results  Component Value Date   VITAMINB12 319 10/04/2013   FOLATE >20.0 10/04/2013   HGBA1C 6.5 11/04/2017   HIV Non Reactive 02/22/2017                        Bone Pathology Markers Lab Results  Component Value Date   VD25OH 35.80 08/06/2017                         Coagulation Parameters Lab Results  Component Value Date   INR 1.12 09/11/2015   LABPROT 14.6 09/11/2015   APTT 31 09/11/2015   PLT 215 08/14/2017   DDIMER 3.85 (H) 10/05/2013                        Cardiovascular Markers Lab Results  Component Value Date   TROPONINI <0.03 09/11/2015   HGB 12.6 08/14/2017   HCT 38.0 08/14/2017                         CA Markers No results found for: CEA, CA125, LABCA2                      Note: Lab results reviewed.  Recent Diagnostic Imaging Review  Cervical Imaging: Cervical MR wo contrast:  Results for orders placed in visit on 06/11/14  MR Cervical Spine Wo Contrast   Cervical DG 1 view:  Results for orders placed during the hospital encounter of 07/28/14  DG Cervical Spine 1 View   Narrative CLINICAL DATA:  Anterior cervical disc fusion C3 through C6  EXAM: DG C-ARM GT 120 MIN; DG CERVICAL SPINE - 1 VIEW  TECHNIQUE: Single image from portable C-arm radiography obtained in the operating room.  CONTRAST:  None  FLUOROSCOPY TIME:  1 min 30 seconds  COMPARISON:  06/11/2014  FINDINGS: Anterior sideplate and screw device is identified extending from C3 through C6. Interbody spacers identified at C3-4, C4-5 and C5-6. No complications identified.  IMPRESSION: Status post ACDF of C3 through  C6.   Electronically Signed   By: Kerby Moors M.D.   On: 07/28/2014 18:28     Cervical DG Bending/F/E views:  Results for orders placed during  the hospital encounter of 10/30/17  DG Cervical Spine With Flex & Extend   Narrative CLINICAL DATA:  Left hand numbness.  EXAM: CERVICAL SPINE COMPLETE WITH FLEXION AND EXTENSION VIEWS  COMPARISON:  CT scan of August 05, 2014.  FINDINGS: Status post surgical anterior fusion of C3-4, C4-5 and C5-6. Mild degenerative disc disease is noted at C6-7 with anterior osteophyte formation. No prevertebral soft tissue swelling is noted. Good alignment of vertebral bodies is noted. No change in vertebral body alignment is noted on flexion or extension views. No significant neural foraminal stenosis is noted.  IMPRESSION: Postsurgical and degenerative changes as described above. No acute abnormality seen in the cervical spine.   Electronically Signed   By: Marijo Conception, M.D.   On: 10/31/2017 08:18   Shoulder Imaging:  Shoulder-R MR wo contrast:  Results for orders placed during the hospital encounter of 03/10/15  MR Shoulder Right Wo Contrast   Narrative CLINICAL DATA:  Anterior right shoulder pain radiating down the arm with weakness for 3 months. No known injury. Initial encounter.  EXAM: MRI OF THE RIGHT SHOULDER WITHOUT CONTRAST  TECHNIQUE: Multiplanar, multisequence MR imaging of the shoulder was performed. No intravenous contrast was administered.  COMPARISON:  None.  FINDINGS: Rotator cuff: There is thickening and heterogeneously increased T2 signal in the supraspinatus, infraspinatus and subscapularis tendons consistent with tendinopathy but no tear is identified. The teres minor is unremarkable.  Muscles:  No atrophy or focal lesion.  Biceps long head: The intra-articular segment of the tendon is thickened with intrasubstance increased T2 signal consistent with marked tendinosis without  tear.  Acromioclavicular Joint: Moderately severe acromioclavicular osteoarthritis is present with marked marrow edema about the joint.  Glenohumeral Joint: Unremarkable.  Labrum:  The superior labrum is markedly degenerated.  Bones: There is no fracture or worrisome marrow lesion. The acromion is type 2 with subacromial spurring. Fluid is present in the subacromial/subdeltoid bursa.  IMPRESSION: Marked appearing rotator cuff and long head of biceps tendinopathy without tear.  Moderately severe acromioclavicular osteoarthritis with marked marrow edema about the joint.  Type 2 acromion and subacromial spurring.  Subacromial/subdeltoid bursitis.   Electronically Signed   By: Inge Rise M.D.   On: 03/10/2015 11:45     Thoracic Imaging: Thoracic MR wo contrast:  Results for orders placed in visit on 06/22/14  MR Thoracic Spine Wo Contrast   Thoracic MR wo contrast: No procedure found. Thoracic MR w/wo contrast:  Results for orders placed in visit on 06/22/14  MR Thoracic Spine W Wo Contrast  Lumbosacral Imaging:  Results for orders placed in visit on 06/22/14  MR Lumbar Spine W Wo Contrast   Lumbar DG (Complete) 4+V:  Results for orders placed during the hospital encounter of 05/18/16  DG Lumbar Spine Complete   Narrative CLINICAL DATA:  Right side sciatica.  EXAM: LUMBAR SPINE - COMPLETE 4+ VIEW  COMPARISON:  None.  FINDINGS: Mild rightward scoliosis. Diffuse degenerative disc and facet disease in the lumbar spine. No fracture or subluxation. SI joints are symmetric and unremarkable. Spinal stimulator device partially imaged. The tip of the wire is not visualized.  IMPRESSION: Degenerative disc and facet disease. Rightward scoliosis. No acute findings.   Electronically Signed   By: Rolm Baptise M.D.   On: 05/18/2016 17:12    Results for orders placed in visit on 05/08/98  DG Lumbar Spine Complete W/Bend   Narrative FINDINGS IMPRESSION   Knee Imaging:  Results for orders placed during the hospital encounter of  08/29/15  MR Knee Right Wo Contrast   Narrative CLINICAL DATA:  Chronic, diffuse right knee pain and instability. Remote history of prior surgery. Initial encounter.  EXAM: MRI OF THE RIGHT KNEE WITHOUT CONTRAST  TECHNIQUE: Multiplanar, multisequence MR imaging of the knee was performed. No intravenous contrast was administered.  COMPARISON:  MRI right knee 06/30/2013.  FINDINGS: MENISCI  Medial meniscus: Horizontal tear in the posterior horn reaches the femoral articular surface. The body is degenerated and extruded peripherally. The appearance is not markedly changed.  Lateral meniscus: The patient has a new complete radial tear through the root of the posterior horn of the lateral meniscus. The body of the lateral meniscus is extruded peripherally with extensive fraying along its free edge. There is a horizontal tear near the junction the posterior horn and body reaching the femoral articular surface as seen on the prior exam.  LIGAMENTS  Cruciates:  Intact.  Collaterals:  Intact.  CARTILAGE  Patellofemoral: Cartilage loss is most notable along the medial patellar facet and not notably changed.  Medial:  Thinned and irregular throughout, unchanged  Lateral:  Thinned and irregular throughout, unchanged.  Joint:  Moderate joint effusion is present.  Popliteal Fossa:  No Baker's cyst.  Extensor Mechanism:  Intact.  Bones: No fracture or worrisome marrow lesion. Osteophytosis about the knee is seen and shows some progression in the patellofemoral compartment.  IMPRESSION: New complete radial tear through the root of the posterior horn of the lateral meniscus. Horizontal tear in the posterior horn reaching the femoral articular surface is seen as on the prior study.  No marked change in a horizontal tear reaching the femoral articular surface in the posterior horn of the medial  meniscus.  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   Electronically Signed   By: Inge Rise M.D.   On: 08/29/2015 11:04    Knee-R DG 4 views:  Results for orders placed during the hospital encounter of 08/24/17  DG Knee Complete 4 Views Right   Narrative CLINICAL DATA:  pt has chronic knee pain (rt) today fell and twisted knee. Pt has an abrasion on the anterior aspect of right knee. Pt states no previous injuries to right knee.  EXAM: RIGHT KNEE - COMPLETE 4+ VIEW  COMPARISON:  None.  FINDINGS: No fracture or bone lesion.  There is moderate narrowing of the lateral joint space compartment with mild narrowing of the patellofemoral joint space compartment. Marginal osteophytes are noted from all 3 compartments.  Moderate joint effusion.  Surrounding soft tissues are unremarkable.  IMPRESSION: 1. No fracture or dislocation. 2. Moderate joint effusion. 3. Degenerative/arthropathic changes primarily involving the lateral and patellofemoral joint space compartments.   Electronically Signed   By: Lajean Manes M.D.   On: 08/24/2017 12:39     Foot Imaging: Foot-R DG Complete:  Results for orders placed during the hospital encounter of 08/14/17  DG Foot Complete Right   Narrative CLINICAL DATA:  Patient to ER for c/o necrotic fourth toe on right foot. States she has h/o diabetic neuropathy, has been watching toe d/t toe being previously broken. Noticed odor when taking off shoe today and noticed toe is now necrotic. Infection to fourth toe.  EXAM: RIGHT FOOT COMPLETE - 3+ VIEW  COMPARISON:  None.  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 There is no osseous erosion of the fourth digit. There is gas beneath the nail of the fourth digit consistent with infection. Third digit also demonstrates no  cortical erosion.  IMPRESSION: 1. No clear cortical erosion within the third or fourth  digit to suggest osteomyelitis. 2. Soft tissue infection of third digit with gas within the nail bed.   Electronically Signed   By: Suzy Bouchard M.D.   On: 08/14/2017 20:18    Foot-L DG Complete:  Results for orders placed in visit on 08/04/13  DG Foot Complete Left   Narrative Multiple views of left foot indicate no indications of ostial lysis or  soft tissue bubbling around the first metatarsal    Complexity Note: Imaging results reviewed. Results shared with Melinda [name], using Layman's terms.                         Meds   Current Outpatient Medications:  .  ACCU-CHEK SOFTCLIX LANCETS lancets, Use to check blood sugar 1 time daily.  Dx: E11.42, Disp: 100 each, Rfl: 2 .  AMBULATORY NON FORMULARY MEDICATION, Medication Name: CPAP MASK OF CHOICE FOR HOME DEVICE, Disp: 1 each, Rfl: 0 .  aspirin 81 MG tablet, Take 81 mg by mouth daily., Disp: , Rfl:  .  buPROPion (WELLBUTRIN XL) 150 MG 24 hr tablet, Take 1 tablet (150 mg total) by mouth daily. MUST SCHEDULE ANNUAL EXAM, Disp: 90 tablet, Rfl: 3 .  Cholecalciferol (VITAMIN D3) 2000 units TABS, Take 2,000 Units by mouth daily., Disp: , Rfl:  .  Chromium Picolinate 800 MCG TABS, Take 800 mcg by mouth daily., Disp: , Rfl:  .  Cinnamon Bark POWD, Take 1 capsule by mouth daily., Disp: , Rfl:  .  Coenzyme Q10 (CO Q 10) 100 MG CAPS, Take 200 mg by mouth daily. , Disp: , Rfl:  .  glipiZIDE (GLUCOTROL XL) 2.5 MG 24 hr tablet, Take 1 tablet (2.5 mg total) by mouth daily with breakfast., Disp: 30 tablet, Rfl: 2 .  glucosamine-chondroitin 500-400 MG tablet, Take 1 tablet by mouth daily. , Disp: , Rfl:  .  glucose blood (ACCU-CHEK AVIVA PLUS) test strip, TEST 1 TIME DAILY  **E11.9**, Disp: 50 each, Rfl: 2 .  MAGNESIUM MALATE PO, Take 115 mg by mouth daily., Disp: , Rfl:  .  Multiple Vitamin (MULTIVITAMIN) tablet, Take 1 tablet by mouth daily., Disp: , Rfl:  .  naloxone (NARCAN) 2 MG/2ML injection, Inject 1 mL (1 mg total) into the muscle as  needed (for opioid overdose). Inject content of syringe into thigh muscle. Call 911., Disp: 2 Syringe, Rfl: 1 .  nitroGLYCERIN (NITROSTAT) 0.4 MG SL tablet, Place 1 tablet (0.4 mg total) under the tongue every 5 (five) minutes as needed for chest pain., Disp: 25 tablet, Rfl: prn .  Omega-3 Fatty Acids (FISH OIL) 1200 MG CAPS, Take 1,200 mg by mouth daily. , Disp: , Rfl:  .  oxyCODONE (OXY IR/ROXICODONE) 5 MG immediate release tablet, Take 1 tablet (5 mg total) by mouth every 8 (eight) hours as needed for severe pain., Disp: 90 tablet, Rfl: 0 .  PARoxetine (PAXIL) 20 MG tablet, TAKE ONE-HALF OF A TABLET BY MOUTH DAILY IN THE MORNING, Disp: 90 tablet, Rfl: 0 .  polyethylene glycol (MIRALAX / GLYCOLAX) packet, Take 17 g by mouth daily as needed (for constipation.). , Disp: , Rfl:  .  pregabalin (LYRICA) 100 MG capsule, Take 1 capsule (100 mg total) by mouth 3 (three) times daily., Disp: 270 capsule, Rfl: 0 .  rosuvastatin (CRESTOR) 20 MG tablet, Take 1 tablet (20 mg total) by mouth daily. Please make overdue yearly appt  with Dr. Marlou Porch before anymore refills. 2nd attempt, Disp: 15 tablet, Rfl: 0 .  tiZANidine (ZANAFLEX) 4 MG tablet, Take 1 tablet (4 mg total) by mouth 3 (three) times daily., Disp: 90 tablet, Rfl: 2 .  vitamin E 400 UNIT capsule, Take 400 Units by mouth daily., Disp: , Rfl:  .  oxyCODONE (OXY IR/ROXICODONE) 5 MG immediate release tablet, Take 1 tablet (5 mg total) by mouth every 8 (eight) hours as needed for severe pain., Disp: 90 tablet, Rfl: 0 .  oxyCODONE (OXY IR/ROXICODONE) 5 MG immediate release tablet, Take 1 tablet (5 mg total) by mouth every 8 (eight) hours as needed for severe pain., Disp: 90 tablet, Rfl: 0  ROS  Constitutional: Denies any fever or chills Gastrointestinal: No reported hemesis, hematochezia, vomiting, or acute GI distress Musculoskeletal: Denies any acute onset joint swelling, redness, loss of ROM, or weakness Neurological: No reported episodes of acute onset  apraxia, aphasia, dysarthria, agnosia, amnesia, paralysis, loss of coordination, or loss of consciousness  Allergies  Melinda [name] has No Known Allergies.  [place]  Drug: Melinda [name]  reports that she does not use drugs. Alcohol:  reports that she drinks alcohol. Tobacco:  reports that she quit smoking about 10 years ago. Her smoking use included cigarettes. She has a 48.00 pack-year smoking history. She has never used smokeless tobacco. 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 Surgical: Ms. 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 Family: family history includes Colon cancer in her paternal grandfather; Diabetes in her father and paternal grandmother; Heart disease in her maternal grandfather; Lung cancer in her mother.  Constitutional Exam  General appearance: Well nourished, well developed, and well hydrated. In no  apparent acute distress Vitals:   01/01/18 0837  BP: 115/67  Pulse: 71  Resp: 16  Temp: 98.3 F (36.8 C)  TempSrc: Oral  SpO2: 97%  Weight: 230 lb (104.3 kg)  Height: '5\' 10"'$  (1.778 m)  Psych/Mental status: Alert, oriented x 3 (person, place, & time)       Eyes: PERLA Respiratory: No evidence of acute respiratory distress  Lumbar Spine Area Exam  Skin & Axial Inspection: No masses, redness, or swelling Alignment: Symmetrical Functional ROM: Unrestricted ROM       Stability: No instability detected Muscle Tone/Strength: Functionally intact. No obvious neuro-muscular anomalies detected. Sensory (Neurological): Unimpaired Palpation: No palpable anomalies       Provocative Tests: Lumbar Hyperextension/rotation test: deferred today       Lumbar quadrant test (Kemp's test): deferred today       Lumbar Lateral bending test: deferred today       Patrick's Maneuver: (+) for left hip arthralgia  FABER test: deferred today       Thigh-thrust test: deferred today       S-I compression test: deferred today       S-I distraction test: deferred today        Gait & Posture Assessment  Ambulation: Unassisted Gait: Relatively normal for age and body habitus Posture: WNL   Lower Extremity Exam    Side: Right lower extremity  Side: Left lower extremity  Stability: No instability observed          Stability: No instability observed          Skin & Extremity Inspection: Knee brace worn  Skin & Extremity Inspection: Skin color, temperature, and hair growth are WNL. No peripheral edema or cyanosis. No masses, redness, swelling, asymmetry, or associated skin lesions. No contractures.  Functional ROM: Unrestricted ROM                  Functional ROM: Unrestricted ROM                  Muscle Tone/Strength: Functionally intact. No obvious neuro-muscular anomalies detected.  Muscle Tone/Strength: Functionally intact. No obvious neuro-muscular anomalies detected.  Sensory (Neurological):  Unimpaired  Sensory (Neurological): Unimpaired  Palpation: No palpable anomalies  Palpation: No palpable anomalies   Assessment  Primary Diagnosis & Pertinent Problem List: The primary encounter diagnosis was Chronic low back pain (Bilateral) (L>R). Diagnoses of Hip pain, acute, left, Chronic sacroiliac joint pain, and Chronic pain syndrome were also pertinent to this visit.  Status Diagnosis  Worsening Worsening Controlled 1. Chronic low back pain (Bilateral) (L>R)   2. Hip pain, acute, left   3. Chronic sacroiliac joint pain   4. Chronic pain syndrome     Problems updated and reviewed during this visit: Problem  Hip Pain, Acute, Left   Plan of Care  Pharmacotherapy (Medications Ordered): No orders of the defined types were placed in this encounter.  New Prescriptions   No medications on file   Medications administered today: Melinda [name] had no medications administered during this visit. Lab-work, procedure(s), and/or referral(s): Orders Placed This Encounter  Procedures  . DG Lumbar Spine Complete W/Bend  . DG Si Joints  . DG HIP UNILAT W OR W/O PELVIS 2-3 VIEWS LEFT   Imaging and/or referral(s): None  Interventional management options: Planned, scheduled, and/or pending:  Intrathecal pump refill.    Considering:  Diagnostic Right knee genicular nerveblocks  Possible right knee genicular RFA Palliative cervical epiduralsteroid injection  Diagnostic cervical facetblock  Possible bilateral cervical facet radiofrequencyablation    Palliative PRN treatment(s):  Cervical epiduralsteroid injection  Right diagnostic genicularnerve block      Provider-requested follow-up: Return for Appointment As Scheduled, xrays.  Future Appointments  Date Time Provider [place]  01/23/2018 11:30 AM Vevelyn Francois, NP [facility] None  02/27/2018 11:15 AM Vevelyn Francois, NP [facility] None   Primary Care Physician: Jearld Fenton, NP Location: [facility]  Outpatient Pain Management Facility Note by: Vevelyn Francois NP Date: 01/01/2018; Time: 10:35 AM  Pain Score Disclaimer: We use the NRS-11 scale. This is a self-reported, subjective measurement of pain severity with only modest accuracy. It is used primarily to identify changes within a particular patient. It must be understood that outpatient pain scales are significantly less accurate that those used for research, where they can be applied under ideal controlled circumstances with minimal exposure to variables. In reality, the score is likely to be a combination of pain intensity  and pain affect, where pain affect describes the degree of emotional arousal or changes in action readiness caused by the sensory experience of pain. Factors such as social and work situation, setting, emotional state, anxiety levels, expectation, and prior pain experience may influence pain perception and show large inter-individual differences that may also be affected by time variables.  Patient instructions provided during this appointment: Patient Instructions   ____________________________________________________________________________________________  Medication Rules  Applies to: All patients receiving prescriptions (written or electronic).  Pharmacy of record: Pharmacy where electronic prescriptions will be sent. If written prescriptions are taken to a different pharmacy, please inform the nursing staff. The pharmacy listed in the electronic medical record should be the one where you would like electronic prescriptions to be sent.  Prescription refills: Only during scheduled appointments. Applies to both, written and electronic prescriptions.  NOTE: The following applies primarily to controlled substances (Opioid* Pain Medications).   Patient's responsibilities: 1. Pain Pills: Bring all pain pills to every appointment (except for procedure appointments). 2. Pill Bottles: Bring pills in original pharmacy  bottle. Always bring newest bottle. Bring bottle, even if empty. 3. Medication refills: You are responsible for knowing and keeping track of what medications you need refilled. The day before your appointment, write a list of all prescriptions that need to be refilled. Bring that list to your appointment and give it to the admitting nurse. Prescriptions will be written only during appointments. If you forget a medication, it will not be "Called in", "Faxed", or "electronically sent". You will need to get another appointment to get these prescribed. 4. Prescription Accuracy: You are responsible for carefully inspecting your prescriptions before leaving our office. Have the discharge nurse carefully go over each prescription with you, before taking them home. Make sure that your name is accurately spelled, that your address is correct. Check the name and dose of your medication to make sure it is accurate. Check the number of pills, and the written instructions to make sure they are clear and accurate. Make sure that you are given enough medication to last until your next medication refill appointment. 5. Taking Medication: Take medication as prescribed. Never take more pills than instructed. Never take medication more frequently than prescribed. Taking less pills or less frequently is permitted and encouraged, when it comes to controlled substances (written prescriptions).  6. Inform other Doctors: Always inform, all of your healthcare providers, of all the medications you take. 7. Pain Medication from other Providers: You are not allowed to accept any additional pain medication from any other Doctor or Healthcare provider. There are two exceptions to this rule. (see below) In the event that you require additional pain medication, you are responsible for notifying us, as stated below. 8. Medication Agreement: You are responsible for carefully reading and following our Medication Agreement. This must be signed  before receiving any prescriptions from our practice. Safely store a copy of your signed Agreement. Violations to the Agreement will result in no further prescriptions. (Additional copies of our Medication Agreement are available upon request.) 9. Laws, Rules, & Regulations: All patients are expected to follow all Federal and [employer], [employer], Rules, [employer]. Ignorance of the Laws does not constitute a valid excuse. The use of any illegal substances is prohibited. 10. Adopted CDC guidelines & recommendations: Target dosing levels will be at or below 60 MME/day. Use of benzodiazepines** is not recommended.  Exceptions: There are only two exceptions to the rule of not receiving pain medications from other Healthcare Providers.  1. Exception #1 (Emergencies): In the event of an emergency (i.e.: accident requiring emergency care), you are allowed to receive additional pain medication. However, you are responsible for: As soon as you are able, call our office [phone], at any time of the day or night, and leave a message stating your name, the date and nature of the emergency, and the name and dose of the medication prescribed. In the event that your call is answered by a member of our staff, make sure to document and save the date, time, and the name of the person that took your information.  2. Exception #2 (Planned Surgery): In the event that you are scheduled by another doctor or dentist to have any type of surgery or procedure, you are allowed (for a period no longer than 30 days), to receive additional pain medication, for the acute post-op pain. However, in this case, you are responsible for picking up a copy of our "Post-op Pain Management for Surgeons" handout, and giving it to your surgeon or dentist. This document is available at our office, and does not require an appointment to obtain it. Simply go to our office during business hours (Monday-Thursday from 8:00 AM to 4:00 PM) (Friday 8:00  AM to 12:00 Noon) or if you have a scheduled appointment with [place], prior to your surgery, and ask for it by name. In addition, you will need to provide [place] with your name, name of your surgeon, type of surgery, and date of procedure or surgery.  *Opioid medications include: morphine, codeine, oxycodone, oxymorphone, hydrocodone, hydromorphone, meperidine, tramadol, tapentadol, buprenorphine, fentanyl, methadone. **Benzodiazepine medications include: diazepam (Valium), alprazolam (Xanax), clonazepam (Klonopine), lorazepam (Ativan), clorazepate (Tranxene), chlordiazepoxide (Librium), estazolam (Prosom), oxazepam (Serax), temazepam (Restoril), triazolam (Halcion) (Last updated: 08/29/2017) ____________________________________________________________________________________________ ____________________________________________________________________________________________  Appointment Policy Summary  It is our goal and responsibility to provide the medical community with assistance in the evaluation and management of patients with chronic pain. Unfortunately our resources are limited. Because we do not have an unlimited amount of time, or available appointments, we are required to closely monitor and manage their use. The following rules exist to maximize their use:  Patient's responsibilities: 1. Punctuality:  At what time should I arrive? You should be physically present in our office 30 minutes before your scheduled appointment. Your scheduled appointment is with your assigned healthcare provider. However, it takes 5-10 minutes to be "checked-in", and another 15 minutes for the nurses to do the admission. If you arrive to our office at the time you were given for your appointment, you will end up being at least 20-25 minutes late to your appointment with the provider. 2. Tardiness:  What happens if I arrive only a few minutes after my scheduled appointment time? You will need to reschedule your  appointment. The cutoff is your appointment time. This is why it is so important that you arrive at least 30 minutes before that appointment. If you have an appointment scheduled for 10:00 AM and you arrive at 10:01, you will be required to reschedule your appointment.  3. Plan ahead:  Always assume that you will encounter traffic on your way in. Plan for it. If you are dependent on a driver, make sure they understand these rules and the need to arrive early. 4. Other appointments and responsibilities:  Avoid scheduling any other appointments before or after your pain clinic appointments.  5. Be prepared:  Write down everything that you need to discuss with your healthcare provider and give this information to  the admitting nurse. Write down the medications that you will need refilled. Bring your pills and bottles (even the empty ones), to all of your appointments, except for those where a procedure is scheduled. 6. No children or pets:  Find someone to take care of them. It is not appropriate to bring them in. 7. Scheduling changes:  We request "advanced notification" of any changes or cancellations. 8. Advanced notification:  Defined as a time period of more than 24 hours prior to the originally scheduled appointment. This allows for the appointment to be offered to other patients. 9. Rescheduling:  When a visit is rescheduled, it will require the cancellation of the original appointment. For this reason they both fall within the category of "Cancellations".  10. Cancellations:  They require advanced notification. Any cancellation less than 24 hours before the  appointment will be recorded as a "No Show". 11. No Show:  Defined as an unkept appointment where the patient failed to notify or declare to the practice their intention or inability to keep the appointment.  Corrective process for repeat offenders:  1. Tardiness: Three (3) episodes of rescheduling due to late arrivals will be recorded  as one (1) "No Show". 2. Cancellation or reschedule: Three (3) cancellations or rescheduling will be recorded as one (1) "No Show". 3. "No Shows": Three (3) "No Shows" within a 12 month period will result in discharge from the practice. ____________________________________________________________________________________________    BMI Assessment: Estimated body mass index is 33 kg/m as calculated from the following:   Height as of this encounter: '5\' 10"'$  (1.778 m).   Weight as of this encounter: 230 lb (104.3 kg).  BMI interpretation table: BMI level Category Range association with higher incidence of chronic pain  <18 kg/m2 Underweight   18.5-24.9 kg/m2 Ideal body weight   25-29.9 kg/m2 Overweight Increased incidence by 20%  30-34.9 kg/m2 Obese (Class I) Increased incidence by 68%  35-39.9 kg/m2 Severe obesity (Class II) Increased incidence by 136%  >40 kg/m2 Extreme obesity (Class III) Increased incidence by 254%   Patient's current BMI Ideal Body weight  Body mass index is 33 kg/m. Ideal body weight: 68.5 kg (151 lb 0.2 oz) Adjusted ideal body weight: 82.8 kg (182 lb 9.7 oz)   BMI Readings from Last 4 Encounters:  01/01/18 33.00 kg/m  11/27/17 33.00 kg/m  11/14/17 33.97 kg/m  10/30/17 33.23 kg/m   Wt Readings from Last 4 Encounters:  01/01/18 230 lb (104.3 kg)  11/27/17 230 lb (104.3 kg)  11/14/17 230 lb (104.3 kg)  10/30/17 225 lb (102.1 kg)

## 2018-01-07 ENCOUNTER — Other Ambulatory Visit: Payer: Self-pay | Admitting: Cardiology

## 2018-01-13 ENCOUNTER — Telehealth: Payer: Self-pay | Admitting: *Deleted

## 2018-01-13 ENCOUNTER — Encounter: Payer: Self-pay | Admitting: Internal Medicine

## 2018-01-13 NOTE — Telephone Encounter (Signed)
Advised to come in and we can interrogate the pump.

## 2018-01-14 DIAGNOSIS — L97521 Non-pressure chronic ulcer of other part of left foot limited to breakdown of skin: Secondary | ICD-10-CM | POA: Diagnosis not present

## 2018-01-14 DIAGNOSIS — E1142 Type 2 diabetes mellitus with diabetic polyneuropathy: Secondary | ICD-10-CM | POA: Diagnosis not present

## 2018-01-14 DIAGNOSIS — M76822 Posterior tibial tendinitis, left leg: Secondary | ICD-10-CM | POA: Diagnosis not present

## 2018-01-23 ENCOUNTER — Ambulatory Visit: Payer: Medicare Other | Attending: Nurse Practitioner | Admitting: Nurse Practitioner

## 2018-01-23 ENCOUNTER — Other Ambulatory Visit: Payer: Self-pay

## 2018-01-23 ENCOUNTER — Encounter: Payer: Self-pay | Admitting: Nurse Practitioner

## 2018-01-23 VITALS — BP 123/74 | HR 72 | Temp 98.6°F | Resp 16 | Ht 70.0 in | Wt 230.0 lb

## 2018-01-23 DIAGNOSIS — E119 Type 2 diabetes mellitus without complications: Secondary | ICD-10-CM | POA: Diagnosis not present

## 2018-01-23 DIAGNOSIS — Z79891 Long term (current) use of opiate analgesic: Secondary | ICD-10-CM | POA: Insufficient documentation

## 2018-01-23 DIAGNOSIS — M25511 Pain in right shoulder: Secondary | ICD-10-CM | POA: Diagnosis not present

## 2018-01-23 DIAGNOSIS — M1712 Unilateral primary osteoarthritis, left knee: Secondary | ICD-10-CM | POA: Diagnosis not present

## 2018-01-23 DIAGNOSIS — I2510 Atherosclerotic heart disease of native coronary artery without angina pectoris: Secondary | ICD-10-CM | POA: Insufficient documentation

## 2018-01-23 DIAGNOSIS — Z79899 Other long term (current) drug therapy: Secondary | ICD-10-CM | POA: Diagnosis not present

## 2018-01-23 DIAGNOSIS — G4733 Obstructive sleep apnea (adult) (pediatric): Secondary | ICD-10-CM | POA: Diagnosis not present

## 2018-01-23 DIAGNOSIS — Z7982 Long term (current) use of aspirin: Secondary | ICD-10-CM | POA: Diagnosis not present

## 2018-01-23 DIAGNOSIS — Z5181 Encounter for therapeutic drug level monitoring: Secondary | ICD-10-CM | POA: Insufficient documentation

## 2018-01-23 DIAGNOSIS — G894 Chronic pain syndrome: Secondary | ICD-10-CM

## 2018-01-23 DIAGNOSIS — M47816 Spondylosis without myelopathy or radiculopathy, lumbar region: Secondary | ICD-10-CM | POA: Diagnosis not present

## 2018-01-23 DIAGNOSIS — M47812 Spondylosis without myelopathy or radiculopathy, cervical region: Secondary | ICD-10-CM

## 2018-01-23 DIAGNOSIS — Z87891 Personal history of nicotine dependence: Secondary | ICD-10-CM | POA: Insufficient documentation

## 2018-01-23 DIAGNOSIS — M797 Fibromyalgia: Secondary | ICD-10-CM | POA: Diagnosis not present

## 2018-01-23 DIAGNOSIS — M25512 Pain in left shoulder: Secondary | ICD-10-CM | POA: Diagnosis not present

## 2018-01-23 DIAGNOSIS — M545 Low back pain: Secondary | ICD-10-CM | POA: Diagnosis not present

## 2018-01-23 MED ORDER — PREGABALIN 100 MG PO CAPS: 100.0000 mg | ORAL_CAPSULE | Freq: Three times a day (TID) | ORAL | 0 refills | Status: DC

## 2018-01-23 MED ORDER — OXYCODONE HCL 5 MG PO TABS: 5.0000 mg | ORAL_TABLET | Freq: Three times a day (TID) | ORAL | 0 refills | Status: DC | PRN

## 2018-01-23 MED ORDER — TIZANIDINE HCL 4 MG PO TABS: 4.0000 mg | ORAL_TABLET | Freq: Three times a day (TID) | ORAL | 3 refills | Status: DC

## 2018-01-23 NOTE — Patient Instructions (Addendum)
_________________You have been given 3 scripts for oxycodone today and 1 script for Lyrica.  You hhave meds to be picked up from pharmacy___________________________________________________________________________  Medication Rules  Applies to: All patients receiving prescriptions (written or electronic).  Pharmacy of record: Pharmacy where electronic prescriptions will be sent. If written prescriptions are taken to a different pharmacy, please inform the nursing staff. The pharmacy listed in the electronic medical record should be the one where you would like electronic prescriptions to be sent.  Prescription refills: Only during scheduled appointments. Applies to both, written and electronic prescriptions.  NOTE: The following applies primarily to controlled substances (Opioid* Pain Medications).   Patient's responsibilities: 1. Pain Pills: Bring all pain pills to every appointment (except for procedure appointments). 2. Pill Bottles: Bring pills in original pharmacy bottle. Always bring newest bottle. Bring bottle, even if empty. 3. Medication refills: You are responsible for knowing and keeping track of what medications you need refilled. The day before your appointment, write a list of all prescriptions that need to be refilled. Bring that list to your appointment and give it to the admitting nurse. Prescriptions will be written only during appointments. If you forget a medication, it will not be "Called in", "Faxed", or "electronically sent". You will need to get another appointment to get these prescribed. 4. Prescription Accuracy: You are responsible for carefully inspecting your prescriptions before leaving our office. Have the discharge nurse carefully go over each prescription with you, before taking them home. Make sure that your name is accurately spelled, that your address is correct. Check the name and dose of your medication to make sure it is accurate. Check the number of pills, and  the written instructions to make sure they are clear and accurate. Make sure that you are given enough medication to last until your next medication refill appointment. 5. Taking Medication: Take medication as prescribed. Never take more pills than instructed. Never take medication more frequently than prescribed. Taking less pills or less frequently is permitted and encouraged, when it comes to controlled substances (written prescriptions).  6. Inform other Doctors: Always inform, all of your healthcare providers, of all the medications you take. 7. Pain Medication from other Providers: You are not allowed to accept any additional pain medication from any other Doctor or Healthcare provider. There are two exceptions to this rule. (see below) In the event that you require additional pain medication, you are responsible for notifying us, as stated below. 8. Medication Agreement: You are responsible for carefully reading and following our Medication Agreement. This must be signed before receiving any prescriptions from our practice. Safely store a copy of your signed Agreement. Violations to the Agreement will result in no further prescriptions. (Additional copies of our Medication Agreement are available upon request.) 9. Laws, Rules, & Regulations: All patients are expected to follow all Federal and [employer], [employer], Rules, [employer]. Ignorance of the Laws does not constitute a valid excuse. The use of any illegal substances is prohibited. 10. Adopted CDC guidelines & recommendations: Target dosing levels will be at or below 60 MME/day. Use of benzodiazepines** is not recommended.  Exceptions: There are only two exceptions to the rule of not receiving pain medications from other Healthcare Providers. 1. Exception #1 (Emergencies): In the event of an emergency (i.e.: accident requiring emergency care), you are allowed to receive additional pain medication. However, you are responsible for: As soon as  you are able, call our office [phone], at any time of the day or  night, and leave a message stating your name, the date and nature of the emergency, and the name and dose of the medication prescribed. In the event that your call is answered by a member of our staff, make sure to document and save the date, time, and the name of the person that took your information.  2. Exception #2 (Planned Surgery): In the event that you are scheduled by another doctor or dentist to have any type of surgery or procedure, you are allowed (for a period no longer than 30 days), to receive additional pain medication, for the acute post-op pain. However, in this case, you are responsible for picking up a copy of our "Post-op Pain Management for Surgeons" handout, and giving it to your surgeon or dentist. This document is available at our office, and does not require an appointment to obtain it. Simply go to our office during business hours (Monday-Thursday from 8:00 AM to 4:00 PM) (Friday 8:00 AM to 12:00 Noon) or if you have a scheduled appointment with [place], prior to your surgery, and ask for it by name. In addition, you will need to provide [place] with your name, name of your surgeon, type of surgery, and date of procedure or surgery.  *Opioid medications include: morphine, codeine, oxycodone, oxymorphone, hydrocodone, hydromorphone, meperidine, tramadol, tapentadol, buprenorphine, fentanyl, methadone. **Benzodiazepine medications include: diazepam (Valium), alprazolam (Xanax), clonazepam (Klonopine), lorazepam (Ativan), clorazepate (Tranxene), chlordiazepoxide (Librium), estazolam (Prosom), oxazepam (Serax), temazepam (Restoril), triazolam (Halcion) (Last updated: 08/29/2017) ____________________________________________________________________________________________    BMI interpretation table: BMI level Category Range association with higher incidence of chronic pain  <18 kg/m2 Underweight   18.5-24.9 kg/m2 Ideal  body weight   25-29.9 kg/m2 Overweight Increased incidence by 20%  30-34.9 kg/m2 Obese (Class I) Increased incidence by 68%  35-39.9 kg/m2 Severe obesity (Class II) Increased incidence by 136%  >40 kg/m2 Extreme obesity (Class III) Increased incidence by 254%   Patient's current BMI Ideal Body weight  Body mass index is 33 kg/m. Ideal body weight: 68.5 kg (151 lb 0.2 oz) Adjusted ideal body weight: 82.8 kg (182 lb 9.7 oz)   BMI Readings from Last 4 Encounters:  01/23/18 33.00 kg/m  01/01/18 33.00 kg/m  11/27/17 33.00 kg/m  11/14/17 33.97 kg/m   Wt Readings from Last 4 Encounters:  01/23/18 230 lb (104.3 kg)  01/01/18 230 lb (104.3 kg)  11/27/17 230 lb (104.3 kg)  11/14/17 230 lb (104.3 kg)

## 2018-01-23 NOTE — Progress Notes (Signed)
Patient's Name: Melinda [name]  MRN: [id number]  Referring Provider: Jearld Fenton, NP  DOB: 12/24/1955  PCP: Jearld Fenton, NP  DOS: 01/23/2018  Note by: Vevelyn Francois NP  Service setting: Ambulatory outpatient  Specialty: Interventional Pain Management  Location: [facility] (AMB) Pain Management Facility    Patient type: Established    Primary Reason(s) for Visit: Encounter for prescription drug management. (Level of risk: moderate)  CC: Back Pain (lower) and Shoulder Pain (bilateral)  HPI  Melinda [name] is a 62 y.o. year old, female patient, who comes today for a medication management evaluation. She has Chronic pain syndrome; CAD (coronary artery disease); Pure hypercholesterolemia; DM2 (diabetes mellitus, type 2) ([place]); Failed back surgical syndrome; Failed cervical surgery syndrome (ACDF); Chronic neck pain; Chronic low back pain (Bilateral) (L>R); Epidural fibrosis; Cervical spondylosis; Neuropathic pain; Fibromyalgia; Presence of functional implant (Medtronic programmable intrathecal pump) (Right abdominal area); Presence of intrathecal pump (Medtronic intrathecal programmable pump) (40 mL pump); Opioid-induced constipation (OIC); Chronic knee pain (Location of Primary Source of Pain) (Right); Osteoarthritis of knee (Left); Anxiety and depression; Long term current use of opiate analgesic; Long term prescription opiate use; Opiate use; Encounter for adjustment or management of infusion pump; Osteoarthritis, multiple sites; Osteoarthritis of knee (Bilateral) (R>L); Other secondary hypertension; OSA (obstructive sleep apnea); Hip pain, acute, left; and Lumbar spondylosis on their problem list. Her primarily concern today is the Back Pain (lower) and Shoulder Pain (bilateral)  Pain Assessment: Location: Lower Back Radiating: both legs to the feet Onset: More than a month ago Duration: Chronic pain Quality: Constant, Aching, Sharp Severity: 4 /10 (subjective, self-reported pain score)  Note:  Reported level is compatible with observation.                          Timing: Constant Modifying factors: sleep BP: 123/74  HR: 72  Melinda [name] was last scheduled for an appointment on 01/01/2018 for medication management. During today's appointment we reviewed Melinda [name]'s chronic pain status, as well as her outpatient medication regimen. She is trying to see why she is hurting so much more. She states that later in the day she has more lower back, hip and leg pain. She admits that she is going to follow up with PCP. She wants more answers. She felt a nerve pain in her legs with coughing on yesterday. She feels like a lot his related to no longer using the Meloxicam. She is 62 and she wants to do a lot of other things in life. She admits that the leg pain is worse than the back pain. The pain goes straight down the back of her legs. However she also has thigh pain.   The patient  reports that she does not use drugs. Her body mass index is 33 kg/m.  Further details on both, my assessment(s), as well as the proposed treatment plan, please see below.  Controlled Substance Pharmacotherapy Assessment REMS (Risk Evaluation and Mitigation Strategy)  Analgesic:Oxycodone IR 5 mg every 8 hours (15 mg/day), in addition to the intrathecal pump medication.  MME/day:22 mg/day of oral medication    Landis Martins, RN  01/23/2018 11:34 AM  Sign at close encounter Nursing Pain Medication Assessment:  Safety precautions to be maintained throughout the outpatient stay will include: orient to surroundings, keep bed in low position, maintain call bell within reach at all times, provide assistance with transfer out of bed and ambulation.  Medication Inspection Compliance: Pill count conducted  under aseptic conditions, in front of the patient. Neither the pills nor the bottle was removed from the patient's sight at any time. Once count was completed pills were immediately returned to the patient in their  original bottle.  Medication: Oxycodone IR Pill/Patch Count: 38 of 90 pills remain Pill/Patch Appearance: Markings consistent with prescribed medication Bottle Appearance: Standard pharmacy container. Clearly labeled. Filled Date: 07/06 / 2019 Last Medication intake:  Today   Pharmacokinetics: Liberation and absorption (onset of action): WNL Distribution (time to peak effect): WNL Metabolism and excretion (duration of action): WNL         Pharmacodynamics: Desired effects: Analgesia: Melinda [name] reports >50% benefit. Functional ability: Patient reports that medication allows her to accomplish basic ADLs Clinically meaningful improvement in function (CMIF): Sustained CMIF goals met Perceived effectiveness: Described as relatively effective, allowing for increase in activities of daily living (ADL) Undesirable effects: Side-effects or Adverse reactions: None reported Monitoring: [place] PMP: Online review of the past 74-monthperiod conducted. Compliant with practice rules and regulations Last UDS on record: Summary  Date Value Ref Range Status  05/02/2017 FINAL  Final    Comment:    ==================================================================== TOXASSURE SELECT 13 (MW) ==================================================================== Test                             Result       Flag       Units Drug Present and Declared for Prescription Verification   Oxycodone                      642          EXPECTED   ng/mg creat   Oxymorphone                    119          EXPECTED   ng/mg creat   Noroxycodone                   1800         EXPECTED   ng/mg creat    Sources of oxycodone include scheduled prescription medications.    Oxymorphone and noroxycodone are expected metabolites of    oxycodone. Oxymorphone is also available as a scheduled    prescription medication. Drug Present not Declared for Prescription Verification   Fentanyl                       9             UNEXPECTED ng/mg creat   Norfentanyl                    24           UNEXPECTED ng/mg creat    Source of fentanyl is a scheduled prescription medication,    including IV, patch, and transmucosal formulations. Norfentanyl    is an expected metabolite of fentanyl. ==================================================================== Test                      Result    Flag   Units      Ref Range   Creatinine              103              mg/dL      >=20 ==================================================================== Declared Medications:  The flagging and interpretation  on this report are based on the  following declared medications.  Unexpected results may arise from  inaccuracies in the declared medications.  **Note: The testing scope of this panel includes these medications:  Oxycodone  **Note: The testing scope of this panel does not include following  reported medications:  Acetaminophen  Aspirin (Aspirin 81)  Bupropion  Cholecalciferol  Chondroitin (Glucosamine-Chondroitin)  Chromium  Glucosamine (Glucosamine-Chondroitin)  Ibuprofen  Magnesium  Meloxicam  Multivitamin  Naloxone  Nitroglycerin  Omega-3 Fatty Acids  Paroxetine  Polyethylene Glycol  Pregabalin  Rosuvastatin  Supplement  Tizanidine  Ubiquinone (Coenzyme Q 10)  Vitamin E ==================================================================== For clinical consultation, please call [phone]. ====================================================================    UDS interpretation: Compliant          Medication Assessment Form: Reviewed. Patient indicates being compliant with therapy Treatment compliance: Compliant Risk Assessment Profile: Aberrant behavior: See prior evaluations. None observed or detected today Comorbid factors increasing risk of overdose: See prior notes. No additional risks detected today Risk of substance use disorder (SUD): Low Opioid Risk Tool - 01/01/18 0846      Family  History of Substance Abuse   Alcohol  Negative    Illegal Drugs  Negative    Rx Drugs  Negative      Personal History of Substance Abuse   Alcohol  Negative    Illegal Drugs  Negative    Rx Drugs  Negative      Psychological Disease   Psychological Disease  Positive    ADD  Negative    OCD  Negative    Bipolar  Negative    Schizophrenia  Negative    Depression  Positive taking paxil and wellbutrin   taking paxil and wellbutrin     Total Score   Opioid Risk Tool Scoring  3    Opioid Risk Interpretation  Low Risk      ORT Scoring interpretation table:  Score <3 = Low Risk for SUD  Score between 4-7 = Moderate Risk for SUD  Score >8 = High Risk for Opioid Abuse   Risk Mitigation Strategies:  Patient Counseling: Covered Patient-Prescriber Agreement (PPA): Present and active  Notification to other healthcare providers: Done  Pharmacologic Plan: No change in therapy, at this time.             Laboratory Chemistry  Inflammation Markers (CRP: Acute Phase) (ESR: Chronic Phase) Lab Results  Component Value Date   CRP 0.2 09/18/2010   ESRSEDRATE 16 09/18/2010   LATICACIDVEN 0.8 08/14/2017                         Rheumatology Markers No results found for: RF, ANA, LABURIC, URICUR, LYMEIGGIGMAB, LYMEABIGMQN, HLAB27                      Renal Function Markers Lab Results  Component Value Date   BUN 30 (H) 11/04/2017   CREATININE 1.10 11/04/2017   GFRAA >60 08/14/2017   GFRNONAA >60 08/14/2017                             Hepatic Function Markers Lab Results  Component Value Date   AST 26 08/14/2017   ALT 23 08/14/2017   ALBUMIN 4.3 08/14/2017   ALKPHOS 105 08/14/2017   HCVAB NEGATIVE 05/18/2016   LIPASE 11 10/04/2013  Electrolytes Lab Results  Component Value Date   NA 140 11/04/2017   K 5.1 11/04/2017   CL 103 11/04/2017   CALCIUM 9.4 11/04/2017   MG 2.1 06/30/2013                        Neuropathy Markers Lab Results  Component  Value Date   VITAMINB12 319 10/04/2013   FOLATE >20.0 10/04/2013   HGBA1C 6.5 11/04/2017   HIV Non Reactive 02/22/2017                        Bone Pathology Markers Lab Results  Component Value Date   VD25OH 35.80 08/06/2017                         Coagulation Parameters Lab Results  Component Value Date   INR 1.12 09/11/2015   LABPROT 14.6 09/11/2015   APTT 31 09/11/2015   PLT 215 08/14/2017   DDIMER 3.85 (H) 10/05/2013                        Cardiovascular Markers Lab Results  Component Value Date   TROPONINI <0.03 09/11/2015   HGB 12.6 08/14/2017   HCT 38.0 08/14/2017                         CA Markers No results found for: CEA, CA125, LABCA2                      Note: Lab results reviewed.  Recent Diagnostic Imaging Results  DG Si Joints CLINICAL DATA:  Chronic low back and left hip pain made worse since a fall 1 month ago. History of laminectomies in the past.  EXAM: BILATERAL SACROILIAC JOINTS - 3+ VIEW  COMPARISON:  Limited views of the sacrum and SI joints from a lumbar spine series of today's date  FINDINGS: The sacrum is subjectively adequately mineralized. No acute fracture is observed. The SI joints appear normal in width. No erosive change or bony ankylosis is demonstrated.  IMPRESSION: There is no acute or significant chronic bony abnormality of the SI joints.  Electronically Signed   By: David  Martinique M.D.   On: 01/01/2018 13:40 DG Lumbar Spine Complete W/Bend CLINICAL DATA:  Chronic low back and left hip pain made worse since a fall 1 month ago. History of 3 lumbar laminectomies.  EXAM: LUMBAR SPINE - COMPLETE WITH BENDING VIEWS  COMPARISON:  Lumbar spine series of May 18, 2016.  FINDINGS: There is chronic dextrocurvature centered at L2-3. The vertebral bodies are preserved in height. There is multilevel moderate degenerative disc space narrowing. Large anterior bridging and near bridging osteophytes are noted at L1-2 and  L2-3 and L4-5. There is multilevel facet joint hypertrophy. As the patient moves from the neutral to the extended into the flexed positions there is no evidence of instability of the spine.  IMPRESSION: Moderate multilevel degenerative disc disease of the lumbar spine. Chronic dextroscoliosis. No compression fracture.  Electronically Signed   By: David  Martinique M.D.   On: 01/01/2018 13:39 DG HIP UNILAT W OR W/O PELVIS 2-3 VIEWS LEFT CLINICAL DATA:  Chronic low back and left hip pain made worse since falling 1 month ago. History of previous lumbar laminectomies with pain pump implantation.  EXAM: DG HIP (WITH OR WITHOUT PELVIS) 2-3V LEFT  COMPARISON:  Limited views  of the pelvis and hips from a lumbar spine series of May 18, 2016  FINDINGS: The bony pelvis is subjectively adequately mineralized. The pain pump projects over the right iliac crest. AP and lateral views of the left hip reveal preservation of the joint space. The articular surfaces of the femoral head and acetabulum remains smoothly rounded. The left femoral neck, intertrochanteric, and subtrochanteric regions exhibit no acute abnormalities.  IMPRESSION: There is no acute or significant chronic bony abnormality of the left hip.  Electronically Signed   By: David  Martinique M.D.   On: 01/01/2018 13:36  Complexity Note: Imaging results reviewed. Results shared with Ms. Chana Bode, using Layman's terms.                         Meds   Current Outpatient Medications:  .  ACCU-CHEK SOFTCLIX LANCETS lancets, Use to check blood sugar 1 time daily.  Dx: E11.42, Disp: 100 each, Rfl: 2 .  AMBULATORY NON FORMULARY MEDICATION, Medication Name: CPAP MASK OF CHOICE FOR HOME DEVICE, Disp: 1 each, Rfl: 0 .  aspirin 81 MG tablet, Take 81 mg by mouth daily., Disp: , Rfl:  .  buPROPion (WELLBUTRIN XL) 150 MG 24 hr tablet, Take 1 tablet (150 mg total) by mouth daily. MUST SCHEDULE ANNUAL EXAM, Disp: 90 tablet, Rfl: 3 .   Cholecalciferol (VITAMIN D3) 2000 units TABS, Take 2,000 Units by mouth daily., Disp: , Rfl:  .  Chromium Picolinate 800 MCG TABS, Take 800 mcg by mouth daily., Disp: , Rfl:  .  Cinnamon Bark POWD, Take 1 capsule by mouth daily., Disp: , Rfl:  .  Coenzyme Q10 (CO Q 10) 100 MG CAPS, Take 200 mg by mouth daily. , Disp: , Rfl:  .  glipiZIDE (GLUCOTROL XL) 2.5 MG 24 hr tablet, Take 1 tablet (2.5 mg total) by mouth daily with breakfast., Disp: 30 tablet, Rfl: 2 .  glucosamine-chondroitin 500-400 MG tablet, Take 1 tablet by mouth daily. , Disp: , Rfl:  .  glucose blood (ACCU-CHEK AVIVA PLUS) test strip, TEST 1 TIME DAILY  **E11.9**, Disp: 50 each, Rfl: 2 .  MAGNESIUM MALATE PO, Take 115 mg by mouth daily., Disp: , Rfl:  .  Multiple Vitamin (MULTIVITAMIN) tablet, Take 1 tablet by mouth daily., Disp: , Rfl:  .  naloxone (NARCAN) 2 MG/2ML injection, Inject 1 mL (1 mg total) into the muscle as needed (for opioid overdose). Inject content of syringe into thigh muscle. Call 911., Disp: 2 Syringe, Rfl: 1 .  nitroGLYCERIN (NITROSTAT) 0.4 MG SL tablet, Place 1 tablet (0.4 mg total) under the tongue every 5 (five) minutes as needed for chest pain., Disp: 25 tablet, Rfl: prn .  Omega-3 Fatty Acids (FISH OIL) 1200 MG CAPS, Take 1,200 mg by mouth daily. , Disp: , Rfl:  .  [START ON 03/05/2018] oxyCODONE (OXY IR/ROXICODONE) 5 MG immediate release tablet, Take 1 tablet (5 mg total) by mouth every 8 (eight) hours as needed for severe pain., Disp: 90 tablet, Rfl: 0 .  PARoxetine (PAXIL) 20 MG tablet, TAKE ONE-HALF OF A TABLET BY MOUTH DAILY IN THE MORNING, Disp: 90 tablet, Rfl: 0 .  polyethylene glycol (MIRALAX / GLYCOLAX) packet, Take 17 g by mouth daily as needed (for constipation.). , Disp: , Rfl:  .  pregabalin (LYRICA) 100 MG capsule, Take 1 capsule (100 mg total) by mouth 3 (three) times daily., Disp: 360 capsule, Rfl: 0 .  rosuvastatin (CRESTOR) 20 MG tablet, TAKE 1 TABLET BY  MOUTH EVERY DAY, Disp: 30 tablet, Rfl:  0 .  tiZANidine (ZANAFLEX) 4 MG tablet, Take 1 tablet (4 mg total) by mouth 3 (three) times daily., Disp: 90 tablet, Rfl: 3 .  vitamin E 400 UNIT capsule, Take 400 Units by mouth daily., Disp: , Rfl:  .  [START ON 04/04/2018] oxyCODONE (OXY IR/ROXICODONE) 5 MG immediate release tablet, Take 1 tablet (5 mg total) by mouth every 8 (eight) hours as needed for severe pain., Disp: 90 tablet, Rfl: 0 .  [START ON 02/03/2018] oxyCODONE (OXY IR/ROXICODONE) 5 MG immediate release tablet, Take 1 tablet (5 mg total) by mouth every 8 (eight) hours as needed for severe pain., Disp: 90 tablet, Rfl: 0  ROS  Constitutional: Denies any fever or chills Gastrointestinal: No reported hemesis, hematochezia, vomiting, or acute GI distress Musculoskeletal: Denies any acute onset joint swelling, redness, loss of ROM, or weakness Neurological: No reported episodes of acute onset apraxia, aphasia, dysarthria, agnosia, amnesia, paralysis, loss of coordination, or loss of consciousness  Allergies  Ms. Winberg has No Known Allergies.  [place]  Drug: Ms. Muscatello  reports that she does not use drugs. Alcohol:  reports that she drinks alcohol. Tobacco:  reports that she quit smoking about 10 years ago. Her smoking use included cigarettes. She has a 48.00 pack-year smoking history. She has never used smokeless tobacco. Medical:  has a past medical history of Anemia, Anxiety, Blue toes, Bulging disc, CAD (coronary artery disease) (2009), Cardiac arrhythmia due to congenital heart disease, Chicken pox, Chronic fatigue, Coronary arteritis, Degenerative disc disease, Depression, Diabetes mellitus without complication ([place]), Facet joint disease, Fibromyalgia, Heart disease, Hyperlipidemia, IBS (irritable bowel syndrome), MCL deficiency, knee, MRSA (methicillin resistant staph aureus) culture positive (2011), Neuropathy (04/18/2010), Peripheral neuropathy, Restless leg syndrome, Sleep apnea (08/17/2003), and Spinal stenosis. Surgical: Ms.  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 Family: family history includes Colon cancer in her paternal grandfather; Diabetes in her father and paternal grandmother; Heart disease in her maternal grandfather; Lung cancer in her mother.  Constitutional Exam  General appearance: Well nourished, well developed, and well hydrated. In no apparent acute distress Vitals:   01/23/18 1127  BP: 123/74  Pulse: 72  Resp: 16  Temp: 98.6 F (37 C)  TempSrc: Oral  SpO2: 92%  Weight: 230 lb (104.3 kg)  Height: '5\' 10"'$  (1.778 m)   BMI Assessment: Estimated body mass index is 33 kg/m as calculated from the following:   Height as of this encounter: '5\' 10"'$  (1.778 m).   Weight as of this encounter: 230 lb (104.3 kg). Psych/Mental status: Alert, oriented x 3 (person, place, & time)       Eyes: PERLA Respiratory: No evidence of acute respiratory distress   Lumbar Spine Area Exam  Skin & Axial Inspection: No masses, redness, or swelling Alignment: Symmetrical Functional ROM: Unrestricted ROM       Stability: No instability detected Muscle Tone/Strength: Functionally intact. No obvious neuro-muscular anomalies detected. Sensory (Neurological): Unimpaired Palpation: No palpable anomalies       Provocative Tests: Lumbar Hyperextension/rotation test: deferred today       Lumbar quadrant test (Kemp's  test): deferred today       Lumbar Lateral bending test: deferred today        Patrick's Maneuver: deferred today                   FABER test: deferred today                   Thigh-thrust test: deferred today       S-I compression test: deferred today       S-I distraction test: deferred today        Gait & Posture Assessment  Ambulation: Unassisted Gait: Relatively normal for age and body habitus Posture: WNL   Lower Extremity Exam    Side: Right lower extremity  Side: Left lower extremity  Stability: No instability observed          Stability: No instability observed          Skin & Extremity Inspection: Skin color, temperature, and hair growth are WNL. No peripheral edema or cyanosis. No masses, redness, swelling, asymmetry, or associated skin lesions. No contractures.  Skin & Extremity Inspection: Skin color, temperature, and hair growth are WNL. No peripheral edema or cyanosis. No masses, redness, swelling, asymmetry, or associated skin lesions. No contractures.  Functional ROM: Unrestricted ROM                  Functional ROM: Unrestricted ROM                  Muscle Tone/Strength: Functionally intact. No obvious neuro-muscular anomalies detected.  Muscle Tone/Strength: Functionally intact. No obvious neuro-muscular anomalies detected.  Sensory (Neurological): Unimpaired  Sensory (Neurological): Unimpaired  Palpation: No palpable anomalies  Palpation: No palpable anomalies   Assessment  Primary Diagnosis & Pertinent Problem List: The primary encounter diagnosis was Cervical spondylosis. Diagnoses of Lumbar spondylosis, Fibromyalgia, Chronic pain syndrome, and Long term current use of opiate analgesic were also pertinent to this visit.  Status Diagnosis  Persistent Persistent Controlled 1. Cervical spondylosis   2. Lumbar spondylosis   3. Fibromyalgia   4. Chronic pain syndrome   5. Long term current use of opiate analgesic     Problems updated and reviewed during this visit: Problem  Lumbar Spondylosis   Plan of Care  Pharmacotherapy  (Medications Ordered): Meds ordered this encounter  Medications  . oxyCODONE (OXY IR/ROXICODONE) 5 MG immediate release tablet    Sig: Take 1 tablet (5 mg total) by mouth every 8 (eight) hours as needed for severe pain.    Dispense:  90 tablet    Refill:  0    Do not place this medication, or any other prescription from our practice, on "Automatic Refill". Patient may have prescription filled one day early if pharmacy is closed on scheduled refill date. Do not fill until:04/04/2018 To last until:05/04/2018    Order Specific Question:   Supervising Provider    Answer:   [name] [phone]  . oxyCODONE (OXY IR/ROXICODONE) 5 MG immediate release tablet    Sig: Take 1 tablet (5 mg total) by mouth every 8 (eight) hours as needed for severe pain.    Dispense:  90 tablet    Refill:  0    Do not place this medication, or any other prescription from our practice, on "Automatic Refill". Patient may have prescription filled one day early if pharmacy is closed on scheduled refill date. Do not fill until:03/05/2018 To last until:04/04/2018    Order Specific Question:   Supervising  Provider    Answer:   [name] [phone]  . oxyCODONE (OXY IR/ROXICODONE) 5 MG immediate release tablet    Sig: Take 1 tablet (5 mg total) by mouth every 8 (eight) hours as needed for severe pain.    Dispense:  90 tablet    Refill:  0    Do not place this medication, or any other prescription from our practice, on "Automatic Refill". Patient may have prescription filled one day early if pharmacy is closed on scheduled refill date. Do not fill until:02/03/2018 To last until:03/05/2018    Order Specific Question:   Supervising Provider    Answer:   [name] [phone]  . pregabalin (LYRICA) 100 MG capsule    Sig: Take 1 capsule (100 mg total) by mouth 3 (three) times daily.    Dispense:  360 capsule    Refill:  0    Do not place this medication, or any other prescription from our practice, on "Automatic  Refill". Patient may have prescription filled one day early if pharmacy is closed on scheduled refill date.    Order Specific Question:   Supervising Provider    Answer:   [name] [phone]  . tiZANidine (ZANAFLEX) 4 MG tablet    Sig: Take 1 tablet (4 mg total) by mouth 3 (three) times daily.    Dispense:  90 tablet    Refill:  3    Order Specific Question:   Supervising Provider    Answer:   [name] [phone]   New Prescriptions   No medications on file   Medications administered today: Jasira C. Viereck had no medications administered during this visit. Lab-work, procedure(s), and/or referral(s): Orders Placed This Encounter  Procedures  . ToxASSURE Select 13 (MW), Urine   Imaging and/or referral(s): None  Interventional management options: Planned, scheduled, and/or pending:  Intrathecal pump refill.    Considering:  Diagnostic Right knee genicular nerveblocks  Possible right knee genicular RFA Palliative cervical epiduralsteroid injection  Diagnostic cervical facetblock  Possible bilateral cervical facet radiofrequencyablation    Palliative PRN treatment(s):  Cervical epiduralsteroid injection  Right diagnostic genicularnerve block   Provider-requested follow-up: Return for Appointment As Scheduled.  Future Appointments  Date Time Provider [place]  02/10/2018  1:40 PM Jerline Pain, MD [facility]  02/14/2018  2:15 PM Jearld Fenton, NP [facility]  02/27/2018 11:15 AM Vevelyn Francois, NP [facility] None  04/30/2018 10:30 AM Vevelyn Francois, NP [facility] None   Primary Care Physician: Jearld Fenton, NP Location: [facility] Note by: Vevelyn Francois NP Date: 01/23/2018; Time: 3:57 PM  Pain Score Disclaimer: We use the NRS-11 scale. This is a self-reported, subjective measurement of pain severity with only modest accuracy. It is used primarily to identify changes within a  particular patient. It must be understood that outpatient pain scales are significantly less accurate that those used for research, where they can be applied under ideal controlled circumstances with minimal exposure to variables. In reality, the score is likely to be a combination of pain intensity and pain affect, where pain affect describes the degree of emotional arousal or changes in action readiness caused by the sensory experience of pain. Factors such as social and work situation, setting, emotional state, anxiety levels, expectation, and prior pain experience may influence pain perception and show large inter-individual differences that may also be affected by time variables.  Patient instructions provided during this appointment: Patient Instructions   _________________You have been  given 3 scripts for oxycodone today and 1 script for Lyrica.  You hhave meds to be picked up from pharmacy___________________________________________________________________________  Medication Rules  Applies to: All patients receiving prescriptions (written or electronic).  Pharmacy of record: Pharmacy where electronic prescriptions will be sent. If written prescriptions are taken to a different pharmacy, please inform the nursing staff. The pharmacy listed in the electronic medical record should be the one where you would like electronic prescriptions to be sent.  Prescription refills: Only during scheduled appointments. Applies to both, written and electronic prescriptions.  NOTE: The following applies primarily to controlled substances (Opioid* Pain Medications).   Patient's responsibilities: 1. Pain Pills: Bring all pain pills to every appointment (except for procedure appointments). 2. Pill Bottles: Bring pills in original pharmacy bottle. Always bring newest bottle. Bring bottle, even if empty. 3. Medication refills: You are responsible for knowing and keeping track of what medications you need  refilled. The day before your appointment, write a list of all prescriptions that need to be refilled. Bring that list to your appointment and give it to the admitting nurse. Prescriptions will be written only during appointments. If you forget a medication, it will not be "Called in", "Faxed", or "electronically sent". You will need to get another appointment to get these prescribed. 4. Prescription Accuracy: You are responsible for carefully inspecting your prescriptions before leaving our office. Have the discharge nurse carefully go over each prescription with you, before taking them home. Make sure that your name is accurately spelled, that your address is correct. Check the name and dose of your medication to make sure it is accurate. Check the number of pills, and the written instructions to make sure they are clear and accurate. Make sure that you are given enough medication to last until your next medication refill appointment. 5. Taking Medication: Take medication as prescribed. Never take more pills than instructed. Never take medication more frequently than prescribed. Taking less pills or less frequently is permitted and encouraged, when it comes to controlled substances (written prescriptions).  6. Inform other Doctors: Always inform, all of your healthcare providers, of all the medications you take. 7. Pain Medication from other Providers: You are not allowed to accept any additional pain medication from any other Doctor or Healthcare provider. There are two exceptions to this rule. (see below) In the event that you require additional pain medication, you are responsible for notifying us, as stated below. 8. Medication Agreement: You are responsible for carefully reading and following our Medication Agreement. This must be signed before receiving any prescriptions from our practice. Safely store a copy of your signed Agreement. Violations to the Agreement will result in no further prescriptions.  (Additional copies of our Medication Agreement are available upon request.) 9. Laws, Rules, & Regulations: All patients are expected to follow all Federal and [employer], [employer], Rules, [employer]. Ignorance of the Laws does not constitute a valid excuse. The use of any illegal substances is prohibited. 10. Adopted CDC guidelines & recommendations: Target dosing levels will be at or below 60 MME/day. Use of benzodiazepines** is not recommended.  Exceptions: There are only two exceptions to the rule of not receiving pain medications from other Healthcare Providers. 1. Exception #1 (Emergencies): In the event of an emergency (i.e.: accident requiring emergency care), you are allowed to receive additional pain medication. However, you are responsible for: As soon as you are able, call our office [phone], at any time of the day or night, and leave  a message stating your name, the date and nature of the emergency, and the name and dose of the medication prescribed. In the event that your call is answered by a member of our staff, make sure to document and save the date, time, and the name of the person that took your information.  2. Exception #2 (Planned Surgery): In the event that you are scheduled by another doctor or dentist to have any type of surgery or procedure, you are allowed (for a period no longer than 30 days), to receive additional pain medication, for the acute post-op pain. However, in this case, you are responsible for picking up a copy of our "Post-op Pain Management for Surgeons" handout, and giving it to your surgeon or dentist. This document is available at our office, and does not require an appointment to obtain it. Simply go to our office during business hours (Monday-Thursday from 8:00 AM to 4:00 PM) (Friday 8:00 AM to 12:00 Noon) or if you have a scheduled appointment with [place], prior to your surgery, and ask for it by name. In addition, you will need to provide [place] with your  name, name of your surgeon, type of surgery, and date of procedure or surgery.  *Opioid medications include: morphine, codeine, oxycodone, oxymorphone, hydrocodone, hydromorphone, meperidine, tramadol, tapentadol, buprenorphine, fentanyl, methadone. **Benzodiazepine medications include: diazepam (Valium), alprazolam (Xanax), clonazepam (Klonopine), lorazepam (Ativan), clorazepate (Tranxene), chlordiazepoxide (Librium), estazolam (Prosom), oxazepam (Serax), temazepam (Restoril), triazolam (Halcion) (Last updated: 08/29/2017) ____________________________________________________________________________________________    BMI interpretation table: BMI level Category Range association with higher incidence of chronic pain  <18 kg/m2 Underweight   18.5-24.9 kg/m2 Ideal body weight   25-29.9 kg/m2 Overweight Increased incidence by 20%  30-34.9 kg/m2 Obese (Class I) Increased incidence by 68%  35-39.9 kg/m2 Severe obesity (Class II) Increased incidence by 136%  >40 kg/m2 Extreme obesity (Class III) Increased incidence by 254%   Patient's current BMI Ideal Body weight  Body mass index is 33 kg/m. Ideal body weight: 68.5 kg (151 lb 0.2 oz) Adjusted ideal body weight: 82.8 kg (182 lb 9.7 oz)   BMI Readings from Last 4 Encounters:  01/23/18 33.00 kg/m  01/01/18 33.00 kg/m  11/27/17 33.00 kg/m  11/14/17 33.97 kg/m   Wt Readings from Last 4 Encounters:  01/23/18 230 lb (104.3 kg)  01/01/18 230 lb (104.3 kg)  11/27/17 230 lb (104.3 kg)  11/14/17 230 lb (104.3 kg)

## 2018-01-23 NOTE — Progress Notes (Signed)
Nursing Pain Medication Assessment:  Safety precautions to be maintained throughout the outpatient stay will include: orient to surroundings, keep bed in low position, maintain call bell within reach at all times, provide assistance with transfer out of bed and ambulation.  Medication Inspection Compliance: Pill count conducted under aseptic conditions, in front of the patient. Neither the pills nor the bottle was removed from the patient's sight at any time. Once count was completed pills were immediately returned to the patient in their original bottle.  Medication: Oxycodone IR Pill/Patch Count: 38 of 90 pills remain Pill/Patch Appearance: Markings consistent with prescribed medication Bottle Appearance: Standard pharmacy container. Clearly labeled. Filled Date: 07/06 / 2019 Last Medication intake:  Today

## 2018-01-27 ENCOUNTER — Other Ambulatory Visit: Payer: Self-pay

## 2018-01-27 LAB — TOXASSURE SELECT 13 (MW), URINE

## 2018-01-27 MED ORDER — PAIN MANAGEMENT IT PUMP REFILL: 1.0000 | Freq: Once | INTRATHECAL | 0 refills | Status: AC

## 2018-02-04 ENCOUNTER — Other Ambulatory Visit: Payer: Self-pay | Admitting: Cardiology

## 2018-02-08 ENCOUNTER — Other Ambulatory Visit: Payer: Self-pay | Admitting: Internal Medicine

## 2018-02-10 ENCOUNTER — Encounter: Payer: Self-pay | Admitting: Cardiology

## 2018-02-10 ENCOUNTER — Ambulatory Visit (INDEPENDENT_AMBULATORY_CARE_PROVIDER_SITE_OTHER): Payer: Medicare Other | Admitting: Cardiology

## 2018-02-10 VITALS — BP 130/76 | HR 68 | Ht 70.0 in | Wt 238.8 lb

## 2018-02-10 DIAGNOSIS — I6523 Occlusion and stenosis of bilateral carotid arteries: Secondary | ICD-10-CM

## 2018-02-10 DIAGNOSIS — I208 Other forms of angina pectoris: Secondary | ICD-10-CM

## 2018-02-10 DIAGNOSIS — I2583 Coronary atherosclerosis due to lipid rich plaque: Secondary | ICD-10-CM

## 2018-02-10 DIAGNOSIS — I2510 Atherosclerotic heart disease of native coronary artery without angina pectoris: Secondary | ICD-10-CM | POA: Diagnosis not present

## 2018-02-10 DIAGNOSIS — E7800 Pure hypercholesterolemia, unspecified: Secondary | ICD-10-CM | POA: Diagnosis not present

## 2018-02-10 NOTE — Progress Notes (Signed)
[facility] [address], [address] [place], [place]  [address] Phone: [phone] Fax:  [phone]  Date:  02/10/2018   ID:  Melinda [name], DOB 1955/10/03, MRN [id number]  PCP:  Jearld Fenton, NP   History of Present Illness: Melinda [name] is a 62 y.o. 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 Cardiac catheterization 2012 revealed patent LAD stent.   She underwent a nuclear stress test 2014 which demonstrated mild anterolateral ischemia with normal ejection fraction. This was a very similar finding asked to prior stress test in August of 2012 which resulted in catheterization in August of 2012 which showed patent LAD stent, DES. There was a diagonal branch jailed by stent with minor luminal irregularity at the ostium. EF was normal. Because of these symptoms, I initiated metoprolol extender release 25 mg to see if this would help.  She felt crummy. Fatigue. Unsure if it's her chronic fatigue, fibromyalgia. We decided to initiate isosorbide. She did feel some head rushing sensation she was bending over or wiping her dogs paws. Her heart rate also has not been increasing as well as usual. She also feels more fatigued.   On 10/09/12 I decided to discontinue her metoprolol 25 mg. On Nov 14, 2012 she is here for followup after discontinuation of beta blocker.  02/18/13 - Doing well. Rare palpitation off metop. Taking [name] class. Enjoy.  03/02/14 - Hospitalized with strep infection. Sepsis.  Echocardiogram reassuring. Infectious disease consultation. Much improved. Fever was as high as 105 she says.   Trying to avoid metformin.A1c 6.9. Lost weight. Eating well. Exercising.   06/09/14-had an episode of chest discomfort that was concerning to her. Prompted this office visit today. She had an episode of pain was 6/10, mid low  sternum, tried to stretch, sat down, pain started to lessen took one nitroglycerin with relief, no nausea or vomiting diaphoresis shortness of breath. She noted some pain in the right side of her neck when she bent over. Pounding in right side of neck. Felt like she did prior to stent.  She had another milder episode of chest pain previous evening. Nitroglycerin improved. She was concerned about the sore spot on her neck. It seems to hurt to touch but it is not enlarged.Walking loosing weight. Not under stress.   03/04/15 - Had cervical spine surgery. Left hand first 3 fingers sometimes have tingling or numbness. Question carpal tunnel as well. No chest pain, no shortness of breath.  Wearing knee brace. Unstable. Right sciatica. Rare palps. Usually last seconds in duration. Stress can sometimes trigger. Overall no chest pain, no fevers, no chills, no shortness of breath.  10/12/16 - feels like heart cant keep up with her. She feels like when she tries to exercise vigorously that she was to go further but her heart won't let her. No chest pain.  She is also had the sensation of near syncope where her vision may darken, ears ring. When she was at the previous doctor's visit her heart pressure was in the low 100s. She has been on her anti-spasmodics for quite some time.  02/10/2018-  Wt Readings from Last 3 Encounters:  02/10/18 238 lb 12.8 oz (108.3 kg)  01/23/18 230 lb (104.3 kg)  01/01/18 230 lb (104.3 kg)     Past Medical History:  Diagnosis Date  .  Anemia    years ago  . Anxiety   . Blue toes    2nd toe on right foot, will get appt.  . Bulging disc   . CAD (coronary artery disease) 2009   s/p stent to LAD  . 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  Ablations done.  Now has rare episodes  . Chicken pox   . Chronic fatigue   . Coronary arteritis   . Degenerative disc disease   . Depression   . Diabetes mellitus without complication ([place])   . Facet joint disease   .  Fibromyalgia   . Heart disease   . Hyperlipidemia   . IBS (irritable bowel syndrome)   . MCL deficiency, knee   . MRSA (methicillin resistant staph aureus) culture positive 2011   GREAT TOE RIGHT FOOT  . Neuropathy 04/18/2010  . Peripheral neuropathy   . Restless leg syndrome   . Sleep apnea 08/17/2003   uses CPAP, sleep study at [facility] (mild to moderate)  . Spinal stenosis     Past Surgical History:  Procedure Laterality Date  . ABLATION     UTERUS  . ABLATION     HEART  . AMPUTATION TOE Right 02/01/2017   Procedure: AMPUTATION TOE-RIGHT 2ND MPJ;  Surgeon: Albertine Patricia, DPM;  Location: [facility];  Service: Podiatry;  Laterality: Right;  . ANTERIOR CERVICAL DECOMP/DISCECTOMY FUSION N/A 07/28/2014   Procedure: ANTERIOR CERVICAL DECOMPRESSION/DISCECTOMY FUSION CERVICAL 3-4,4-5,5-6 LEVELS WITH INSTRUMENTATION AND ALLOGRAFT;  Surgeon: Sinclair Ship, MD;  Location: [facility];  Service: Orthopedics;  Laterality: N/A;  Anterior cervical decompression fusion, cervical 3-4, cervical 4-5, cervical 5-6 with instrumentation and allograft  . BACK SURGERY     X 3 1979, 1994, 1995  . CARPAL TUNNEL RELEASE Right   . CHOLECYSTECTOMY  2003  . CORONARY ANGIOPLASTY    . EYE SURGERY Bilateral 2013   Eyelid lift   . FOOT SURGERY Right    BIG TOE  . FOOT SURGERY Bilateral    PLANTAR FASCIITIS  . FOOT SURGERY Right    2ND TOE  . GALLBLADDER SURGERY    . HAMMER TOE SURGERY Right 10/17/2017   Procedure: HAMMER TOE CORRECTION-4TH TOE;  Surgeon: Albertine Patricia, DPM;  Location: [facility];  Service: Podiatry;  Laterality: Right;  LMA- WITH LOCAL Diabetic - diet controlled  . HAND SURGERY Left   . HAND SURGEY Left   . HEART STENT  2009   LAD  . INFUSION PUMP IMPLANTATION     X2 with morphine and baclofen  . IRRIGATION AND DEBRIDEMENT FOOT Right 02/23/2017   Procedure: IRRIGATION AND DEBRIDEMENT FOOT-right foot;  Surgeon: Samara Deist, DPM;  Location: [facility];  Service:  Podiatry;  Laterality: Right;  . TOE SURGERY     then revision 8/18    Current Outpatient Medications  Medication Sig Dispense Refill  . ACCU-CHEK SOFTCLIX LANCETS lancets Use to check blood sugar 1 time daily.  Dx: E11.42 100 each 2  . AMBULATORY NON FORMULARY MEDICATION Medication Name: CPAP MASK OF CHOICE FOR HOME DEVICE 1 each 0  . aspirin 81 MG tablet Take 81 mg by mouth daily.    Marland Kitchen buPROPion (WELLBUTRIN XL) 150 MG 24 hr tablet Take 1 tablet (150 mg total) by mouth daily. MUST SCHEDULE ANNUAL EXAM 90 tablet 3  . Cholecalciferol (VITAMIN D3) 2000 units TABS Take 2,000 Units by mouth daily.    . Chromium Picolinate 800 MCG TABS Take 800 mcg by  mouth daily.    Wallace Cullens POWD Take 1 capsule by mouth daily.    . Coenzyme Q10 (CO Q 10) 100 MG CAPS Take 200 mg by mouth daily.     Marland Kitchen glipiZIDE (GLUCOTROL XL) 2.5 MG 24 hr tablet TAKE 1 TABLET (2.5 MG TOTAL) BY MOUTH DAILY WITH BREAKFAST. 30 tablet 0  . glucosamine-chondroitin 500-400 MG tablet Take 1 tablet by mouth daily.     Marland Kitchen glucose blood (ACCU-CHEK AVIVA PLUS) test strip TEST 1 TIME DAILY  **E11.9** 50 each 2  . MAGNESIUM MALATE PO Take 115 mg by mouth daily.    . Multiple Vitamin (MULTIVITAMIN) tablet Take 1 tablet by mouth daily.    . naloxone (NARCAN) 2 MG/2ML injection Inject 1 mL (1 mg total) into the muscle as needed (for opioid overdose). Inject content of syringe into thigh muscle. Call 911. 2 Syringe 1  . nitroGLYCERIN (NITROSTAT) 0.4 MG SL tablet Place 1 tablet (0.4 mg total) under the tongue every 5 (five) minutes as needed for chest pain. 25 tablet prn  . Omega-3 Fatty Acids (FISH OIL) 1200 MG CAPS Take 1,200 mg by mouth daily.     Derrill Memo ON 04/04/2018] oxyCODONE (OXY IR/ROXICODONE) 5 MG immediate release tablet Take 1 tablet (5 mg total) by mouth every 8 (eight) hours as needed for severe pain. 90 tablet 0  . [START ON 03/05/2018] oxyCODONE (OXY IR/ROXICODONE) 5 MG immediate release tablet Take 1 tablet (5 mg total) by mouth  every 8 (eight) hours as needed for severe pain. 90 tablet 0  . oxyCODONE (OXY IR/ROXICODONE) 5 MG immediate release tablet Take 1 tablet (5 mg total) by mouth every 8 (eight) hours as needed for severe pain. 90 tablet 0  . PARoxetine (PAXIL) 20 MG tablet TAKE ONE-HALF OF A TABLET BY MOUTH DAILY IN THE MORNING 90 tablet 0  . polyethylene glycol (MIRALAX / GLYCOLAX) packet Take 17 g by mouth daily as needed (for constipation.).     Marland Kitchen pregabalin (LYRICA) 100 MG capsule Take 1 capsule (100 mg total) by mouth 3 (three) times daily. 360 capsule 0  . PRESCRIPTION MEDICATION     . rosuvastatin (CRESTOR) 20 MG tablet Take 1 tablet (20 mg total) by mouth daily. Please keep upcoming appt in August for future refills. Thank you 30 tablet 0  . tiZANidine (ZANAFLEX) 4 MG tablet Take 1 tablet (4 mg total) by mouth 3 (three) times daily. 90 tablet 3  . vitamin E 400 UNIT capsule Take 400 Units by mouth daily.     No current facility-administered medications for this visit.     Allergies:   No Known Allergies  Social History:  The patient  reports that she quit smoking about 10 years ago. Her smoking use included cigarettes. She has a 48.00 pack-year smoking history. She has never used smokeless tobacco. She reports that she drinks alcohol. She reports that she does not use drugs.   Family History  Problem Relation Age of Onset  . Lung cancer Mother   . Diabetes Father   . Heart disease Maternal Grandfather   . Diabetes Paternal Grandmother   . Colon cancer Paternal Grandfather   . Stroke Neg Hx     ROS:  Please see the history of present illness.  No other ROS, neg  PHYSICAL EXAM: VS:  BP 130/76   Pulse 68   Ht 5\' 10"  (1.778 m)   Wt 238 lb 12.8 oz (108.3 kg)   SpO2 96%  BMI 34.26 kg/m  GEN: Well nourished, well developed, in no acute distress  HEENT: normal, prior neck surg.  Neck: no JVD, carotid bruits, or masses Cardiac: RRR; no murmurs, rubs, or gallops,no edema  Respiratory:  clear to  auscultation bilaterally, normal work of breathing GI: soft, nontender, nondistended, + BS MS: no deformity or atrophy  Skin: warm and dry, no rash, minor sun burn Neuro:  Alert and Oriented x 3, Strength and sensation are intact Psych: euthymic mood, full affect   EKG:  10/12/16-sinus rhythm with no other abnormalities. Personally viewed-prior 06/09/14-sinus rhythm, 71, no other ST segment changes.  ASSESSMENT AND PLAN:  1. Carotid artery disease-bilateral 50% stenosis. Moderate. We will continue to monitor. Dopplers performed in December 2016 approximate 50% bilateral carotid artery stenosis. Asymptomatic. I offered her vascular referral previously but she would like to wait at this point.  No strokelike symptoms.  Continue with aggressive secondary prevention. 2. Coronary artery disease-stable.  LAD stent patent at last catheterization.  EKG previously unremarkable.  At a prior visit had to stop her isosorbide because of orthostatic hypotension.  She is doing well without.  She has been off of beta blocker for quite some time secondary to fatigue. She is doing well today.  Continue with adequate hydration. 3. Angina-well-controlled. She does have atypical sharp stabbing chest pains at times. Noncardiac. EKG reassuring.  No changes made.  She has extensive neck pain, back pain.  Scoliosis. 4. Obesity-continue to work on this.  It is challenging given her chronic pain. No changes made implant. She is a member of the [employer] 5. Hyperlipidemia-currently on Crestor. Doing well. No myalgias. LDL less than 70, 63. Goal.  No myalgias. 6. 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  At risk for future infection 7. Orthostatic hypotension  -have improved after stopping isosorbide.  She wonders as well if this was secondary to low glucose at one point.  Will see in follow up 12 months  Signed, Candee Furbish, MD [facility]  02/10/2018 2:17 PM

## 2018-02-10 NOTE — Patient Instructions (Signed)
Medication Instructions:  The current medical regimen is effective;  continue present plan and medications.  Follow-Up: Follow up in 1 year with Dr. Skains.  You will receive a letter in the mail 2 months before you are due.  Please call us when you receive this letter to schedule your follow up appointment.  If you need a refill on your cardiac medications before your next appointment, please call your pharmacy.  Thank you for choosing  [facility]!!     

## 2018-02-14 ENCOUNTER — Ambulatory Visit: Payer: Medicare Other | Admitting: Internal Medicine

## 2018-02-17 DIAGNOSIS — L97511 Non-pressure chronic ulcer of other part of right foot limited to breakdown of skin: Secondary | ICD-10-CM | POA: Diagnosis not present

## 2018-02-17 DIAGNOSIS — Z89421 Acquired absence of other right toe(s): Secondary | ICD-10-CM | POA: Diagnosis not present

## 2018-02-17 DIAGNOSIS — E1142 Type 2 diabetes mellitus with diabetic polyneuropathy: Secondary | ICD-10-CM | POA: Diagnosis not present

## 2018-02-19 ENCOUNTER — Ambulatory Visit (INDEPENDENT_AMBULATORY_CARE_PROVIDER_SITE_OTHER): Payer: Medicare Other | Admitting: Internal Medicine

## 2018-02-19 ENCOUNTER — Encounter: Payer: Self-pay | Admitting: Internal Medicine

## 2018-02-19 VITALS — BP 130/84 | HR 65 | Temp 98.2°F | Wt 238.0 lb

## 2018-02-19 DIAGNOSIS — D225 Melanocytic nevi of trunk: Secondary | ICD-10-CM | POA: Diagnosis not present

## 2018-02-19 DIAGNOSIS — I208 Other forms of angina pectoris: Secondary | ICD-10-CM

## 2018-02-19 DIAGNOSIS — R140 Abdominal distension (gaseous): Secondary | ICD-10-CM

## 2018-02-19 DIAGNOSIS — E1141 Type 2 diabetes mellitus with diabetic mononeuropathy: Secondary | ICD-10-CM | POA: Diagnosis not present

## 2018-02-19 DIAGNOSIS — L821 Other seborrheic keratosis: Secondary | ICD-10-CM | POA: Diagnosis not present

## 2018-02-19 DIAGNOSIS — G894 Chronic pain syndrome: Secondary | ICD-10-CM | POA: Diagnosis not present

## 2018-02-19 LAB — BASIC METABOLIC PANEL
BUN: 27 mg/dL — ABNORMAL HIGH (ref 6–23)
CO2: 32 mEq/L (ref 19–32)
Calcium: 9.8 mg/dL (ref 8.4–10.5)
Chloride: 103 mEq/L (ref 96–112)
Creatinine, Ser: 1.02 mg/dL (ref 0.40–1.20)
GFR: 58.67 mL/min — ABNORMAL LOW (ref >60.00)
Glucose, Bld: 94 mg/dL (ref 70–99)
Potassium: 4.4 mEq/L (ref 3.5–5.1)
Sodium: 141 mEq/L (ref 135–145)

## 2018-02-19 LAB — HEMOGLOBIN A1C: Hgb A1c MFr Bld: 6.2 % (ref 4.6–6.5)

## 2018-02-19 NOTE — Patient Instructions (Signed)
Skin Biopsy, Care After Refer to this sheet in the next few weeks. These instructions provide you with information about caring for yourself after your procedure. Your health care provider may also give you more specific instructions. Your treatment has been planned according to current medical practices, but problems sometimes occur. Call your health care provider if you have any problems or questions after your procedure. What can I expect after the procedure? After the procedure, it is common to have:  Soreness.  Bruising.  Itching.  Follow these instructions at home:  Rest and then return to your normal activities as told by your health care provider.  Take over-the-counter and prescription medicines only as told by your health care provider.  Follow instructions from your health care provider about how to take care of your biopsy site.Make sure you: ? Wash your hands with soap and water before you change your bandage (dressing). If soap and water are not available, use hand sanitizer. ? Change your dressing as told by your health care provider. ? Leave stitches (sutures), skin glue, or adhesive strips in place. These skin closures may need to stay in place for 2 weeks or longer. If adhesive strip edges start to loosen and curl up, you may trim the loose edges. Do not remove adhesive strips completely unless your health care provider tells you to do that. If the biopsy area bleeds, apply gentle pressure for 10 minutes.  Check your biopsy site every day for signs of infection. Check for: ? More redness, swelling, or pain. ? More fluid or blood. ? Warmth. ? Pus or a bad smell.  Keep all follow-up visits as told by your health care provider. This is important. Contact a health care provider if:  You have more redness, swelling, or pain around your biopsy site.  You have more fluid or blood coming from your biopsy site.  Your biopsy site feels warm to the touch.  You have pus or  a bad smell coming from your biopsy site.  You have a fever. Get help right away if:  You have bleeding that does not stop with pressure or a dressing. This information is not intended to replace advice given to you by your health care provider. Make sure you discuss any questions you have with your health care provider. Document Released: 07/15/2015 Document Revised: 02/12/2016 Document Reviewed: 09/15/2014 Elsevier Interactive Patient Education  [employer].

## 2018-02-19 NOTE — Progress Notes (Signed)
Subjective:    Patient ID: Melinda [name], female    DOB: Nov 20, 1955, 62 y.o.   MRN: [id number]  HPI  Pt presents to the clinic to recheck her kidney function. She reports back in May, her GFR was 53.82. Creatinine was normal. At that point in time, her pain management doctor had her stop Meloxicam. Since that time, she has had significant increase in pain. She would like to have the kidney function rechecked to see if she could possibly restart the Meloxicam.  She also wants to discuss her blood sugars. She reports her last A1C was 6.5%, 10/2017. She was started on Glipizide 2.5 mg daily. Since that time, she has noticed an increase in abdominal boating and gas. She denies nausea, vomiting, diarrhea, constipation or blood in her stool. She reports her sugars range 97-164, she is concerned that they should be lower than that. She wants A1C repeated today.  She also has a dark mole on her back that she is concerned. She reports it has been there for years. It has not gotten larger in size but has gotten darker in color. She reports it does not itch. She has not put anything on it.  Review of Systems  Past Medical History:  Diagnosis Date  . Anemia    years ago  . Anxiety   . Blue toes    2nd toe on right foot, will get appt.  . Bulging disc   . CAD (coronary artery disease) 2009   s/p stent to LAD  . 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  Ablations done.  Now has rare episodes  . Chicken pox   . Chronic fatigue   . Coronary arteritis   . Degenerative disc disease   . Depression   . Diabetes mellitus without complication ([place])   . Facet joint disease   . Fibromyalgia   . Heart disease   . Hyperlipidemia   . IBS (irritable bowel syndrome)   . MCL deficiency, knee   . MRSA (methicillin resistant staph aureus) culture positive 2011   GREAT TOE RIGHT FOOT  . Neuropathy 04/18/2010  . Peripheral neuropathy   . Restless leg syndrome   . Sleep apnea 08/17/2003     uses CPAP, sleep study at [facility] (mild to moderate)  . Spinal stenosis     Current Outpatient Medications  Medication Sig Dispense Refill  . ACCU-CHEK SOFTCLIX LANCETS lancets Use to check blood sugar 1 time daily.  Dx: E11.42 100 each 2  . AMBULATORY NON FORMULARY MEDICATION Medication Name: CPAP MASK OF CHOICE FOR HOME DEVICE 1 each 0  . aspirin 81 MG tablet Take 81 mg by mouth daily.    Marland Kitchen buPROPion (WELLBUTRIN XL) 150 MG 24 hr tablet Take 1 tablet (150 mg total) by mouth daily. MUST SCHEDULE ANNUAL EXAM 90 tablet 3  . Cholecalciferol (VITAMIN D3) 2000 units TABS Take 2,000 Units by mouth daily.    . Chromium Picolinate 800 MCG TABS Take 800 mcg by mouth daily.    Wallace Cullens POWD Take 1 capsule by mouth daily.    . Coenzyme Q10 (CO Q 10) 100 MG CAPS Take 200 mg by mouth daily.     Marland Kitchen glipiZIDE (GLUCOTROL XL) 2.5 MG 24 hr tablet TAKE 1 TABLET (2.5 MG TOTAL) BY MOUTH DAILY WITH BREAKFAST. 30 tablet 0  . glucosamine-chondroitin 500-400 MG tablet Take 1 tablet by mouth daily.     Marland Kitchen glucose blood (ACCU-CHEK AVIVA  PLUS) test strip TEST 1 TIME DAILY  **E11.9** 50 each 2  . MAGNESIUM MALATE PO Take 115 mg by mouth daily.    . Multiple Vitamin (MULTIVITAMIN) tablet Take 1 tablet by mouth daily.    . naloxone (NARCAN) 2 MG/2ML injection Inject 1 mL (1 mg total) into the muscle as needed (for opioid overdose). Inject content of syringe into thigh muscle. Call 911. 2 Syringe 1  . nitroGLYCERIN (NITROSTAT) 0.4 MG SL tablet Place 1 tablet (0.4 mg total) under the tongue every 5 (five) minutes as needed for chest pain. 25 tablet prn  . NONFORMULARY OR COMPOUNDED ITEM 147.95 mcg by Epidural Infusion route Continuous EPIDURAL. Medtronic Neuromodulation pump Fentanyl 147.95 mcg, Baclofen, bupivicaine    . NONFORMULARY OR COMPOUNDED ITEM 88.77 mcg by Epidural Infusion route Continuous EPIDURAL. Medtronic Neuromodulation pump: Fentanyl, baclofen 88.77, bupivicane    . NONFORMULARY OR  COMPOUNDED ITEM 5.918 mg by Epidural Infusion route Continuous EPIDURAL. Medtronic Neuromodulation pump: Fentanyl, Baclofen, Bupivicaine 5.918 mg    . Omega-3 Fatty Acids (FISH OIL) 1200 MG CAPS Take 1,200 mg by mouth daily.     Derrill Memo ON 04/04/2018] oxyCODONE (OXY IR/ROXICODONE) 5 MG immediate release tablet Take 1 tablet (5 mg total) by mouth every 8 (eight) hours as needed for severe pain. 90 tablet 0  . [START ON 03/05/2018] oxyCODONE (OXY IR/ROXICODONE) 5 MG immediate release tablet Take 1 tablet (5 mg total) by mouth every 8 (eight) hours as needed for severe pain. 90 tablet 0  . oxyCODONE (OXY IR/ROXICODONE) 5 MG immediate release tablet Take 1 tablet (5 mg total) by mouth every 8 (eight) hours as needed for severe pain. 90 tablet 0  . PARoxetine (PAXIL) 20 MG tablet TAKE ONE-HALF OF A TABLET BY MOUTH DAILY IN THE MORNING 90 tablet 0  . polyethylene glycol (MIRALAX / GLYCOLAX) packet Take 17 g by mouth daily as needed (for constipation.).     Marland Kitchen pregabalin (LYRICA) 100 MG capsule Take 1 capsule (100 mg total) by mouth 3 (three) times daily. 360 capsule 0  . PRESCRIPTION MEDICATION     . rosuvastatin (CRESTOR) 20 MG tablet Take 1 tablet (20 mg total) by mouth daily. Please keep upcoming appt in August for future refills. Thank you 30 tablet 0  . tiZANidine (ZANAFLEX) 4 MG tablet Take 1 tablet (4 mg total) by mouth 3 (three) times daily. 90 tablet 3  . vitamin E 400 UNIT capsule Take 400 Units by mouth daily.     No current facility-administered medications for this visit.     No Known Allergies  Family History  Problem Relation Age of Onset  . Lung cancer Mother   . Diabetes Father   . Heart disease Maternal Grandfather   . Diabetes Paternal Grandmother   . Colon cancer Paternal Grandfather   . Stroke Neg Hx     Social History   Socioeconomic History  . Marital status: Significant Other    Spouse name: Not on file  . Number of children: Not on file  . Years of education: Not on  file  . Highest education level: Not on file  Occupational History  . Not on file  Social Needs  . Financial resource strain: Not on file  . Food insecurity:    Worry: Not on file    Inability: Not on file  . Transportation needs:    Medical: Not on file    Non-medical: Not on file  Tobacco Use  . Smoking status: Former Smoker  Packs/day: 1.50    Years: 32.00    Pack years: 48.00    Types: Cigarettes    Last attempt to quit: 07/22/2007    Years since quitting: 10.5  . Smokeless tobacco: Never Used  Substance and Sexual Activity  . Alcohol use: Yes    Alcohol/week: 0.0 standard drinks    Comment: occ - Holidays  . Drug use: No  . Sexual activity: Yes  Lifestyle  . Physical activity:    Days per week: Not on file    Minutes per session: Not on file  . Stress: Not on file  Relationships  . Social connections:    Talks on phone: Not on file    Gets together: Not on file    Attends religious service: Not on file    Active member of club or organization: Not on file    Attends meetings of clubs or organizations: Not on file    Relationship status: Not on file  . Intimate partner violence:    Fear of current or ex partner: Not on file    Emotionally abused: Not on file    Physically abused: Not on file    Forced sexual activity: Not on file  Other Topics Concern  . Not on file  Social History Narrative   Lives at home with a partner. Independent at baseline     Constitutional: Denies fever, malaise, fatigue, headache or abrupt weight changes.  Respiratory: Denies difficulty breathing, shortness of breath, cough or sputum production.   Cardiovascular: Denies chest pain, chest tightness, palpitations or swelling in the hands or feet.  Gastrointestinal: Pt reports abdominal bloating and gas. Denies abdominal pain, constipation, diarrhea or blood in the stool.  GU: Denies urgency, frequency, pain with urination, burning sensation, blood in urine, odor or  discharge. Musculoskeletal: Pt reports chronic joint and muscle pain. Denies decrease in range of motion, difficulty with gait, or joint swelling.  Skin: Pt reports dark mole on back. Denies redness, rashes, or ulcercations.    No other specific complaints in a complete review of systems (except as listed in HPI above).     Objective:   Physical Exam   BP 130/84   Pulse 65   Temp 98.2 F (36.8 C) (Oral)   Wt 238 lb (108 kg)   SpO2 97%   BMI 34.15 kg/m  Wt Readings from Last 3 Encounters:  02/19/18 238 lb (108 kg)  02/10/18 238 lb 12.8 oz (108.3 kg)  01/23/18 230 lb (104.3 kg)    General: Appears her stated age, obese in NAD. Skin: 0.5 cm round hyperpigmented lesion with central discoloration noted over midline thoracic back. Abdomen: Soft and nontender. Normal bowel sounds. No distention or masses noted. Musculoskeletal: Gait slow and steady without device. Neurological: Alert and oriented. Sensation decreased to BLE.   BMET    Component Value Date/Time   NA 140 11/04/2017 1326   NA 135 (L) 06/30/2013 1203   K 5.1 11/04/2017 1326   K 4.1 06/30/2013 1203   CL 103 11/04/2017 1326   CL 102 06/30/2013 1203   CO2 30 11/04/2017 1326   CO2 32 06/30/2013 1203   GLUCOSE 96 11/04/2017 1326   GLUCOSE 143 (H) 06/30/2013 1203   BUN 30 (H) 11/04/2017 1326   BUN 19 (H) 06/30/2013 1203   CREATININE 1.10 11/04/2017 1326   CREATININE 0.89 06/30/2013 1203   CALCIUM 9.4 11/04/2017 1326   CALCIUM 9.7 06/30/2013 1203   GFRNONAA >60 08/14/2017 1950   GFRNONAA >  60 06/30/2013 1203   GFRAA >60 08/14/2017 1950   GFRAA >60 06/30/2013 1203    Lipid Panel     Component Value Date/Time   CHOL 130 11/04/2017 1326   TRIG 285.0 (H) 11/04/2017 1326   HDL 37.50 (L) 11/04/2017 1326   CHOLHDL 3 11/04/2017 1326   VLDL 57.0 (H) 11/04/2017 1326   LDLCALC 62 09/11/2015 0421    CBC    Component Value Date/Time   WBC 6.2 08/14/2017 1950   RBC 4.47 08/14/2017 1950   HGB 12.6 08/14/2017  1950   HGB 14.0 11/12/2011 1136   HCT 38.0 08/14/2017 1950   HCT 40.7 11/12/2011 1136   PLT 215 08/14/2017 1950   PLT 274 11/12/2011 1136   MCV 85.0 08/14/2017 1950   MCV 86 11/12/2011 1136   MCH 28.3 08/14/2017 1950   MCHC 33.3 08/14/2017 1950   RDW 13.0 08/14/2017 1950   RDW 12.8 11/12/2011 1136   LYMPHSABS 1.8 08/14/2017 1950   LYMPHSABS 2.5 11/12/2011 1136   MONOABS 0.6 08/14/2017 1950   MONOABS 0.8 11/12/2011 1136   EOSABS 0.3 08/14/2017 1950   EOSABS 0.6 11/12/2011 1136   BASOSABS 0.0 08/14/2017 1950   BASOSABS 0.1 11/12/2011 1136    Hgb A1C Lab Results  Component Value Date   HGBA1C 6.5 11/04/2017           Assessment & Plan:   Nevus of Back:  Procedure Note:  Discussed risks of procedure including bleeding, infection Discussed possible need for referral to derm if atypical and margins not clean Informed consent obtained verbally Area cleansed with Betadine swab x 3 Area numbed with 1% Lidocaine with Epi 0.5 ml Lesion excised with dermablade- sent off for pathology Area cauterized Area covered with TAB and bandaid No complications, pt tolerated procedure well Aftercare instructions given  Abdominal Bloating, Gas:  Will check H Pylori today Consider trial of Omeprazole 20 mg daily, pending lab results  Decreased GFR, Chronic Pain:  BMET today If GFR > 60, will try to restart Meloxicam  DM 2:  A1C today Continue Glipizide for now, will adjust if needed based on labs  Will follow up after labs, return precautions discussed [name], NP

## 2018-02-19 NOTE — Addendum Note (Signed)
Addended by: Lurlean Nanny on: 02/19/2018 04:19 PM   Modules accepted: Orders

## 2018-02-20 LAB — H. PYLORI ANTIBODY, IGG: H Pylori IgG: NEGATIVE

## 2018-02-21 ENCOUNTER — Encounter: Payer: Self-pay | Admitting: Internal Medicine

## 2018-02-27 ENCOUNTER — Encounter: Payer: Self-pay | Admitting: Nurse Practitioner

## 2018-02-27 ENCOUNTER — Other Ambulatory Visit: Payer: Self-pay | Admitting: Cardiology

## 2018-02-27 ENCOUNTER — Other Ambulatory Visit: Payer: Self-pay

## 2018-02-27 ENCOUNTER — Ambulatory Visit: Payer: Medicare Other | Attending: Nurse Practitioner | Admitting: Nurse Practitioner

## 2018-02-27 VITALS — BP 147/92 | HR 73 | Temp 98.5°F | Resp 16 | Ht 70.0 in | Wt 238.0 lb

## 2018-02-27 DIAGNOSIS — M2041 Other hammer toe(s) (acquired), right foot: Secondary | ICD-10-CM | POA: Diagnosis not present

## 2018-02-27 DIAGNOSIS — M2042 Other hammer toe(s) (acquired), left foot: Secondary | ICD-10-CM | POA: Insufficient documentation

## 2018-02-27 DIAGNOSIS — G894 Chronic pain syndrome: Secondary | ICD-10-CM

## 2018-02-27 DIAGNOSIS — M47812 Spondylosis without myelopathy or radiculopathy, cervical region: Secondary | ICD-10-CM | POA: Diagnosis not present

## 2018-02-27 DIAGNOSIS — M1991 Primary osteoarthritis, unspecified site: Secondary | ICD-10-CM | POA: Insufficient documentation

## 2018-02-27 DIAGNOSIS — M797 Fibromyalgia: Secondary | ICD-10-CM | POA: Diagnosis not present

## 2018-02-27 DIAGNOSIS — Z9049 Acquired absence of other specified parts of digestive tract: Secondary | ICD-10-CM | POA: Diagnosis not present

## 2018-02-27 DIAGNOSIS — Z955 Presence of coronary angioplasty implant and graft: Secondary | ICD-10-CM | POA: Insufficient documentation

## 2018-02-27 DIAGNOSIS — Z969 Presence of functional implant, unspecified: Secondary | ICD-10-CM

## 2018-02-27 DIAGNOSIS — M8949 Other hypertrophic osteoarthropathy, multiple sites: Secondary | ICD-10-CM

## 2018-02-27 DIAGNOSIS — M159 Polyosteoarthritis, unspecified: Secondary | ICD-10-CM

## 2018-02-27 DIAGNOSIS — M47816 Spondylosis without myelopathy or radiculopathy, lumbar region: Secondary | ICD-10-CM | POA: Diagnosis not present

## 2018-02-27 DIAGNOSIS — M549 Dorsalgia, unspecified: Secondary | ICD-10-CM | POA: Diagnosis present

## 2018-02-27 DIAGNOSIS — M150 Primary generalized (osteo)arthritis: Secondary | ICD-10-CM

## 2018-02-27 MED ORDER — MELOXICAM 15 MG PO TABS
15.0000 mg | ORAL_TABLET | Freq: Every day | ORAL | 0 refills | Status: DC
Start: 2018-02-27 — End: 2018-04-09

## 2018-02-27 NOTE — Progress Notes (Signed)
Patient's Name: Melinda [name]  MRN: [id number]  Referring Provider: Jearld Fenton, NP  DOB: [birth year]  PCP: Jearld Fenton, NP  DOS: 02/27/2018  Note by: Dionisio David, NP  Service setting: Ambulatory outpatient  Specialty: Interventional Pain Management  Patient type: Established  Location: [facility] (AMB) Pain Management Facility  Visit type: Interventional Procedure   Primary Reason for Visit: Interventional Pain Management Treatment. CC: Back Pain (lower)  Procedure:  Intrathecal Drug Delivery System (IDDS):  Type: Reservoir Refill [phone])       Region: Abdominal Laterality: Right  Type of Pump: Medtronic Synchromed II (MRI-compatible) Delivery Route: Intrathecal Type of Pain Treated: Neuropathic/Nociceptive Primary Medication Class: Opioid/opiate  Medication, Concentration, Infusion Program, & Delivery Rate: Please see scanned programming printout.  Indications: 1. Lumbar spondylosis   2. Cervical spondylosis   3. Primary osteoarthritis involving multiple joints   4. 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 Fibromyalgia   6. Chronic pain syndrome    Pain Assessment: Self-Reported Pain Score: 4 /10             Reported level is compatible with observation.        Intrathecal Pump Therapy Assessment  Manufacturer: Medtronic Synchromed II Type: Programmable Volume: 40 mL reservoir MRI compatibility: Yes   Drug content:  Primary Medication Class: Opioid Primary Medication: PF-Fentanyl Secondary Medication: see pump readout Other Medication: see pump readout   Programming:  Type: Simple continuous. See pump readout for details.   Changes:  Medication Change: None at this point Rate Change: No change in rate  Reported side-effects or adverse reactions: None reported  Effectiveness: Described as relatively effective, allowing for increase in activities of daily living (ADL) Clinically meaningful improvement in  function (CMIF): Sustained CMIF goals met  Plan: Pump refill today  Pre-op Assessment:  Melinda [name] is a 62 y.o. (year old), female patient, seen today for interventional treatment. 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 Melinda [name] has a current medication list which includes the following prescription(s): accu-chek softclix lancets, AMBULATORY NON FORMULARY MEDICATION, aspirin, bupropion, vitamin d3, chromium picolinate, cinnamon bark, co q 10, glipizide, glucosamine-chondroitin, glucose blood, magnesium malate, multivitamin, naloxone, nitroglycerin, NONFORMULARY OR COMPOUNDED ITEM, NONFORMULARY OR COMPOUNDED ITEM, NONFORMULARY OR COMPOUNDED ITEM, fish oil, oxycodone, oxycodone, oxycodone, paroxetine, polyethylene glycol, pregabalin, rosuvastatin, tizanidine, vitamin e, and meloxicam. Her primarily concern today is the Back Pain (lower)  Initial Vital Signs:  Pulse/HCG Rate: 73  Temp: 98.5 F (36.9 C) Resp: 16 BP: (!) 147/92 SpO2: 95 %  BMI: Estimated body mass index is 34.15 kg/m as calculated from the following:   Height as of this encounter: _0  (1.778 m).   Weight as of this encounter: 238 lb (108 kg).  Risk Assessment: Allergies: Reviewed. She has No Known Allergies.  Allergy Precautions: None required Coagulopathies: Reviewed. None identified.  Blood-thinner therapy: None at this time Active Infection(s): Reviewed. None identified. [name] is  afebrile  Site Confirmation: Melinda [name] was asked to confirm the procedure and laterality before marking the site Procedure checklist: Completed Consent: Before the procedure and under the  influence of no sedative(s), amnesic(s), or anxiolytics, the patient was informed of the treatment options, risks and possible complications. To fulfill our ethical and legal obligations, as recommended by the American Medical Association's Code of Ethics, I have informed the patient of my clinical impression; the nature and purpose of the treatment or procedure; the risks, benefits, and possible complications of the intervention; the alternatives, including doing nothing; the risk(s) and benefit(s) of the alternative treatment(s) or procedure(s); and the risk(s) and benefit(s) of doing nothing.  Melinda [name] was provided with information about the general risks and possible complications associated with most interventional procedures. These include, but are not limited to: failure to achieve desired goals, infection, bleeding, organ or nerve damage, allergic reactions, paralysis, and/or death.  In addition, she was informed of those risks and possible complications associated to this particular procedure, which include, but are not limited to: damage to the implant; failure to decrease pain; local, systemic, or serious CNS infections, intraspinal abscess with possible cord compression and paralysis, or life-threatening such as meningitis; bleeding; organ damage; nerve injury or damage with subsequent sensory, motor, and/or autonomic system dysfunction, resulting in transient or permanent pain, numbness, and/or weakness of one or several areas of the body; allergic reactions, either minor or major life-threatening, such as anaphylactic or anaphylactoid reactions.  Furthermore, Melinda [name] was informed of those risks and complications associated with the medications. These include, but are not limited to: allergic  reactions (i.e.: anaphylactic or anaphylactoid reactions); endorphine suppression; bradycardia and/or hypotension; water retention and/or peripheral vascular relaxation leading to lower extremity edema and possible stasis ulcers; respiratory depression and/or shortness of breath; decreased metabolic rate leading to weight gain; swelling or edema; medication-induced neural toxicity; particulate matter embolism and blood vessel occlusion with resultant organ, and/or nervous system infarction; and/or intrathecal granuloma formation with possible spinal cord compression and permanent paralysis.  Before refilling the pump Ms. Balbi was informed that some of the medications used in the devise may not be FDA approved for such use and therefore it constitutes an off-label use of the medications.  Finally, she was informed that Medicine is not an exact science; therefore, there is also the possibility of unforeseen or unpredictable risks and/or possible complications that may result in a catastrophic outcome. The patient indicated having understood very clearly. We have given the patient no guarantees and we have made no promises. Enough time was given to the patient to ask questions, all of which were answered to the patient's satisfaction. Ms. Swindle has indicated that she wanted to continue with the procedure. Attestation: I, the ordering provider, attest that I have discussed with the patient the benefits, risks, side-effects, alternatives, likelihood of achieving goals, and potential problems during recovery for the procedure that I have provided informed consent. Date  Time: 02/27/2018 11:42 AM  Pre-Procedure Preparation:  Monitoring: As per clinic protocol. Respiration, ETCO2, SpO2, BP, heart rate and rhythm monitor placed and checked for adequate function Safety Precautions: Patient was assessed for positional comfort and pressure points before starting the procedure. Time-out: I initiated and conducted  the "Time-out" before starting the procedure, as per protocol. The patient was asked to participate by confirming the accuracy of the "Time Out" information. Verification of the correct person, site, and procedure were performed and confirmed by me, the nursing staff, and the patient. "Time-out" conducted as per Joint Commission's Universal Protocol (UP.01.01.01). Time: 1212  Description of Procedure Process:   Position: Supine Target Area: Central-port of intrathecal pump. Approach: Anterior, 90 degree  angle approach. Area Prepped: Entire Area around the pump implant. Prepping solution: ChloraPrep (2% chlorhexidine gluconate and 70% isopropyl alcohol) Safety Precautions: Aspiration looking for blood return was conducted prior to all injections. At no point did we inject any substances, as a needle was being advanced. No attempts were made at seeking any paresthesias. Safe injection practices and needle disposal techniques used. Medications properly checked for expiration dates. SDV (single dose vial) medications used. Description of the Procedure: Protocol guidelines were followed. Two nurses trained to do implant refills were present during the entire procedure. The refill medication was checked by both healthcare providers as well as the patient. The patient was included in the "Time-out" to verify the medication. The patient was placed in position. The pump was identified. The area was prepped in the usual manner. The sterile template was positioned over the pump, making sure the side-port location matched that of the pump. Both, the pump and the template were held for stability. The needle provided in the Medtronic Kit was then introduced thru the center of the template and into the central port. The pump content was aspirated and discarded volume documented. The new medication was slowly infused into the pump, thru the filter, making sure to avoid overpressure of the device. The needle was then  removed and the area cleansed, making sure to leave some of the prepping solution back to take advantage of its long term bactericidal properties. The pump was interrogated and programmed to reflect the correct medication, volume, and dosage. The program was printed and taken to the physician for approval. Once checked and signed by the physician, a copy was provided to the patient and another scanned into the EMR. Vitals:   02/27/18 1140  BP: (!) 147/92  Pulse: 73  Resp: 16  Temp: 98.5 F (36.9 C)  TempSrc: Oral  SpO2: 95%  Weight: 238 lb (108 kg)  Height: _0  (1.778 m)    Start Time: 1212 hrs. End Time: 1213 hrs. Materials & Medications: Medtronic Refill Kit Medication(s): Please see chart orders for details.  Imaging Guidance:          Type of Imaging Technique: None used Indication(s): N/A Exposure Time: No patient exposure Contrast: None used. Fluoroscopic Guidance: N/A Ultrasound Guidance: N/A Interpretation: N/A  Antibiotic Prophylaxis:   Anti-infectives (From admission, onward)   None     Indication(s): None identified  Post-operative Assessment:  Post-procedure Vital Signs:  Pulse/HCG Rate: 73  Temp: 98.5 F (36.9 C) Resp: 16 BP: (!) 147/92 SpO2: 95 %  EBL: None  Complications: No immediate post-treatment complications observed by team, or reported by patient.  Note: The patient tolerated the entire procedure well. A repeat set of vitals were taken after the procedure and the patient was kept under observation following institutional policy, for this type of procedure. Post-procedural neurological assessment was performed, showing return to baseline, prior to discharge. The patient was provided with post-procedure discharge instructions, including a section on how to identify potential problems. Should any problems arise concerning this procedure, the patient was given instructions to immediately contact us, at any time, without hesitation. In any case, we  plan to contact the patient by telephone for a follow-up status report regarding this interventional procedure.  Comments:  No additional relevant information. She admits that she continues to have low back pain.  She feels like it has improved slightly since her last visit.  She would like to restart the meloxicam.  She did have a chemistry completed which  was not normal however it was improved from her previous she admits that her primary care states that it would be okay to restart the meloxicam but would allow our office to make the final decision.  She would like to restart the meloxicam for 69-monthand then have her lab retested with primary care and we will make a final decision after this. Controlled Substance Pharmacotherapy Assessment REMS (Risk Evaluation and Mitigation Strategy)  Analgesic:Oxycodone IR 5 mg every 8 hours (15 mg/day), in addition to the intrathecal pump medication.  MME/day:22 mg/day of oral medication WRise Patience RN  02/27/2018 11:39 AM  Signed Nursing Pain Medication Assessment:  Safety precautions to be maintained throughout the outpatient stay will include: orient to surroundings, keep bed in low position, maintain call bell within reach at all times, provide assistance with transfer out of bed and ambulation.  Medication Inspection Compliance: Pill count conducted under aseptic conditions, in front of the patient. Neither the pills nor the bottle was removed from the patient's sight at any time. Once count was completed pills were immediately returned to the patient in their original bottle.  Medication: oxycodone 5 mg Pill/Patch Count: 24 of 90 pills remain Pill/Patch Appearance: Markings consistent with prescribed medication Bottle Appearance: Standard pharmacy container. Clearly labeled. Filled Date: 8 / 5 / 2019 Last Medication intake:  Today   Pharmacokinetics: Liberation and absorption (onset of action): WNL Distribution (time to peak effect):  WNL Metabolism and excretion (duration of action): WNL         Pharmacodynamics: Desired effects: Analgesia: Ms. CSwareyreports >50% benefit. Functional ability: Patient reports that medication allows her to accomplish basic ADLs Clinically meaningful improvement in function (CMIF): Sustained CMIF goals met Perceived effectiveness: Described as relatively effective, allowing for increase in activities of daily living (ADL) Undesirable effects: Side-effects or Adverse reactions: None reported Monitoring: [place] PMP: Online review of the past 125-montheriod conducted. Compliant with practice rules and regulations Last UDS on record: Summary  Date Value Ref Range Status  01/23/2018 FINAL  Final    Comment:    ==================================================================== TOXASSURE SELECT 13 (MW) ==================================================================== Test                             Result       Flag       Units Drug Present and Declared for Prescription Verification   Oxycodone                      1016         EXPECTED   ng/mg creat   Oxymorphone                    159          EXPECTED   ng/mg creat   Noroxycodone                   1697         EXPECTED   ng/mg creat    Sources of oxycodone include scheduled prescription medications.    Oxymorphone and noroxycodone are expected metabolites of    oxycodone. Oxymorphone is also available as a scheduled    prescription medication. Drug Present not Declared for Prescription Verification   Fentanyl                       8  UNEXPECTED ng/mg creat   Norfentanyl                    94           UNEXPECTED ng/mg creat    Source of fentanyl is a scheduled prescription medication,    including IV, patch, and transmucosal formulations. Norfentanyl    is an expected metabolite of fentanyl. ==================================================================== Test                      Result    Flag   Units      Ref  Range   Creatinine              150              mg/dL      >=20 ==================================================================== Declared Medications:  The flagging and interpretation on this report are based on the  following declared medications.  Unexpected results may arise from  inaccuracies in the declared medications.  **Note: The testing scope of this panel includes these medications:  Oxycodone  **Note: The testing scope of this panel does not include following  reported medications:  Aspirin (Aspirin 81)  Bupropion  Cholecalciferol  Chondroitin (Glucosamine-Chondroitin)  Chromium  Cinnamon Bark  Glipizide  Glucosamine (Glucosamine-Chondroitin)  Magnesium  Multivitamin  Naloxone  Nitroglycerin  Paroxetine  Polyethylene Glycol  Pregabalin  Rosuvastatin  Supplement (Omega-3)  Tizanidine  Ubiquinone (Coenzyme Q 10)  Vitamin E ==================================================================== For clinical consultation, please call [phone]. ====================================================================    UDS interpretation: Compliant          Medication Assessment Form: Reviewed. Patient indicates being compliant with therapy Treatment compliance: Compliant Risk Assessment Profile: Aberrant behavior: See prior evaluations. None observed or detected today Comorbid factors increasing risk of overdose: See prior notes. No additional risks detected today Opioid risk tool (ORT) (Total Score): 1 Personal History of Substance Abuse (SUD-Substance use disorder):  Alcohol: Negative  Illegal Drugs: Negative  Rx Drugs: Negative  ORT Risk Level calculation: Low Risk Risk of substance use disorder (SUD): Low Opioid Risk Tool - 02/27/18 1150      Family History of Substance Abuse   Alcohol  Negative    Illegal Drugs  Negative    Rx Drugs  Negative      Personal History of Substance Abuse   Alcohol  Negative    Illegal Drugs  Negative    Rx Drugs   Negative      Age   Age between 52-45 years   No      Psychological Disease   Psychological Disease  Negative    ADD  Negative    Depression  Positive      Total Score   Opioid Risk Tool Scoring  1    Opioid Risk Interpretation  Low Risk      ORT Scoring interpretation table:  Score <3 = Low Risk for SUD  Score between 4-7 = Moderate Risk for SUD  Score >8 = High Risk for Opioid Abuse   Risk Mitigation Strategies:  Patient Counseling: Covered Patient-Prescriber Agreement (PPA): Present and active  Notification to other healthcare providers: Done  Pharmacologic Plan: No change in therapy, at this time.             Assessment  Primary Diagnosis & Pertinent Problem List: The primary encounter diagnosis was Lumbar spondylosis. Diagnoses of Cervical spondylosis, Primary osteoarthritis involving multiple joints, Presence of functional implant (Medtronic programmable intrathecal pump) (Right  abdominal area), Fibromyalgia, and Chronic pain syndrome were also pertinent to this visit.  Status Diagnosis  Persistent Persistent Persistent 1. Lumbar spondylosis   2. Cervical spondylosis   3. Primary osteoarthritis involving multiple joints   4. 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 Fibromyalgia   6. Chronic pain syndrome     Problems updated and reviewed during this visit: No problems updated. Plan of Care   Imaging Orders  No imaging studies ordered today   Procedure Orders    No procedure(s) ordered today    Medications ordered for procedure: Meds ordered this encounter  Medications  . meloxicam (MOBIC) 15 MG tablet    Sig: Take 1 tablet (15 mg total) by mouth daily.    Dispense:  90 tablet    Refill:  0    Do not place medication on "Automatic Refill". Fill one day early if pharmacy is closed on scheduled refill date.    Order Specific Question:   Supervising Provider    Answer:   [name]  [phone]   Medications administered: Zuri C. Gladue had no medications administered during this visit.  See the medical record for exact dosing, route, and time of administration.  New Prescriptions   No medications on file   Disposition: Discharge home  Discharge Date & Time: 02/27/2018; 1245 hrs.   Physician-requested Follow-up: Return for Appointment As Scheduled.  Future Appointments  Date Time Provider [place]  03/12/2018  1:00 PM Gary Fleet [facility]  04/30/2018 10:30 AM Vevelyn Francois, NP [facility] None   Primary Care Physician: Jearld Fenton, NP Location: [facility] Note by: Dionisio David, NP Date: 02/27/2018; Time: 1:15 PM  Disclaimer:  Medicine is not an Chief Strategy Officer. The only guarantee in medicine is that nothing is guaranteed. It is important to note that the decision to proceed with this intervention was based on the information collected from the patient. The Data and conclusions were drawn from the patient's questionnaire, the interview, and the physical examination. Because the information was provided in large part by the patient, it cannot be guaranteed that it has not been purposely or unconsciously manipulated. Every effort has been made to obtain as much relevant data as possible for this evaluation. It is important to note that the conclusions that lead to this procedure are derived in large part from the available data. Always take into account that the treatment will also be dependent on availability of resources and existing treatment guidelines, considered by other Pain Management Practitioners as being common knowledge and practice, at the time of the intervention. For Medico-Legal purposes, it is also important to point out that variation in procedural techniques and pharmacological choices are the acceptable norm. The indications, contraindications, technique, and results of the above procedure should only  be interpreted and judged by a Board-Certified Interventional Pain Specialist with extensive familiarity and expertise in the same exact procedure and technique.

## 2018-02-27 NOTE — Patient Instructions (Signed)
Referral to Dr Lovenia Shuck for pump replacement.  Patient informed that she needs refill by the end of November.  Patient wants to get pump replaced by then.  She will call me to inform me of pump replacement date so that we may schedule her pump refill.       Opioid Overdose Opioids are substances that relieve pain by binding to pain receptors in your brain and spinal cord. Opioids include illegal drugs, such as heroin, as well as prescription pain medicines.An opioid overdose happens when you take too much of an opioid substance. This can happen with any type of opioid, including:  Heroin.  Morphine.  Codeine.  Methadone.  Oxycodone.  Hydrocodone.  Fentanyl.  Hydromorphone.  Buprenorphine.  The effects of an overdose can be mild, dangerous, or even deadly. Opioid overdose is a medical emergency. What are the causes? This condition may be caused by:  Taking too much of an opioid by accident.  Taking too much of an opioid on purpose.  An error made by a health care provider who prescribes a medicine.  An error made by the pharmacist who fills the prescription order.  Using more than one substance that contains opioids at the same time.  Mixing an opioid with a substance that affects your heart, breathing, or blood pressure. These include alcohol, tranquilizers, sleeping pills, illegal drugs, and some over-the-counter medicines.  What increases the risk? This condition is more likely in:  Children. They may be attracted to colorful pills. Because of a child's small size, even a small amount of a drug can be dangerous.  Elderly people. They may be taking many different drugs. Elderly people may have difficulty reading labels or remembering when they last took their medicine.  People who take an opioid on a long-term basis.  People who use: ? Illegal drugs. ? Other substances, including alcohol, while using an opioid.  People who have: ? A history of drug or alcohol  abuse. ? Certain mental health conditions.  People who take opioids that are not prescribed for them.  What are the signs or symptoms? Symptoms of this condition depend on the type of opioid and the amount that was taken. Common symptoms include:  Sleepiness or difficulty waking from sleep.  Confusion.  Slurred speech.  Slowed breathing and a slow pulse.  Nausea and vomiting.  Abnormally small pupils.  Signs and symptoms that require emergency treatment include:  Cold, clammy, and pale skin.  Blue lips and fingernails.  Vomiting.  Gurgling sounds in the throat.  A pulse that is very slow or difficult to detect.  Breathing that is very slow, noisy, or difficult to detect.  Limp body.  Inability to respond to speech or be awakened from sleep (stupor).  How is this diagnosed? This condition is diagnosed based on your symptoms. It is important to tell your health care provider:  All of the opioidsthat you took.  When you took the opioids.  Whether you were drinking alcohol or using other substances.  Your health care provider will do a physical exam. This exam may include:  Checking and monitoring your heart rate and rhythm, your breathing rate and depth, your temperature, and your blood pressure (vital signs).  Checking for abnormally small pupils.  Measuring oxygen levels in your blood.  You may also have blood tests or urine tests. How is this treated? Supporting your vital signs and your breathing is the first step in treating an opioid overdose. Treatment may also include:  Giving fluids and minerals (electrolytes) through an IV tube.  Inserting a breathing tube (endotracheal tube) in your airway to help you breathe.  Giving oxygen.  Passing a tube through your nose and into your stomach (NG tube, or nasogastric tube) to wash out your stomach.  Giving medicines that: ? Increase your blood pressure. ? Absorb any opioid that is in your digestive  system. ? Reverse the effects of the opioid (naloxone).  Ongoing counseling and mental health support if you intentionally overdosed or used an illegal drug.  Follow these instructions at home:  Take over-the-counter and prescription medicines only as told by your health care provider. Always ask your health care provider about possible side effects and interactions of any new medicine that you start taking.  Keep a list of all of the medicines that you take, including over-the-counter medicines. Bring this list with you to all of your medical visits.  Drink enough fluid to keep your urine clear or pale yellow.  Keep all follow-up visits as told by your health care provider. This is important. How is this prevented?  Get help if you are struggling with: ? Alcohol or drug use. ? Depression or another mental health problem.  Keep the phone number of your local poison control center near your phone or on your cell phone.  Store all medicines in safety containers that are out of the reach of children.  Read the drug inserts that come with your medicines.  Do not drink alcohol when taking opioids.  Do not use illegal drugs.  Do not take opioid medicines that are not prescribed for you. Contact a health care provider if:  Your symptoms return.  You develop new symptoms or side effects when you are taking medicines. Get help right away if:  You think that you or someone else may have taken too much of an opioid. The hotline of the [facility] is ([phone])379-0518.  You or someone else is having symptoms of an opioid overdose.  You have serious thoughts about hurting yourself or others.  You have: ? Chest pain. ? Difficulty breathing. ? A loss of consciousness. Opioid overdose is an emergency. Do not wait to see if the symptoms will go away. Get medical help right away. Call your local emergency services (911 in the [place] Do not drive yourself to the  hospital. This information is not intended to replace advice given to you by your health care provider. Make sure you discuss any questions you have with your health care provider. Document Released: 07/26/2004 Document Revised: 11/24/2015 Document Reviewed: 12/02/2014 Elsevier Interactive Patient Education  [employer].

## 2018-02-27 NOTE — Progress Notes (Signed)
Nursing Pain Medication Assessment:  Safety precautions to be maintained throughout the outpatient stay will include: orient to surroundings, keep bed in low position, maintain call bell within reach at all times, provide assistance with transfer out of bed and ambulation.  Medication Inspection Compliance: Pill count conducted under aseptic conditions, in front of the patient. Neither the pills nor the bottle was removed from the patient's sight at any time. Once count was completed pills were immediately returned to the patient in their original bottle.  Medication: oxycodone 5 mg Pill/Patch Count: 24 of 90 pills remain Pill/Patch Appearance: Markings consistent with prescribed medication Bottle Appearance: Standard pharmacy container. Clearly labeled. Filled Date: 8 / 5 / 2019 Last Medication intake:  Today

## 2018-02-28 ENCOUNTER — Telehealth: Payer: Self-pay

## 2018-02-28 NOTE — Telephone Encounter (Signed)
Post pump refill phone call.  Patient states she is doing good.

## 2018-03-05 DIAGNOSIS — E1142 Type 2 diabetes mellitus with diabetic polyneuropathy: Secondary | ICD-10-CM | POA: Diagnosis not present

## 2018-03-05 DIAGNOSIS — L97511 Non-pressure chronic ulcer of other part of right foot limited to breakdown of skin: Secondary | ICD-10-CM | POA: Diagnosis not present

## 2018-03-10 ENCOUNTER — Other Ambulatory Visit: Payer: Self-pay | Admitting: Internal Medicine

## 2018-03-10 MED FILL — Medication: INTRATHECAL | Qty: 1 | Status: AC

## 2018-03-11 NOTE — Progress Notes (Signed)
[name] [place],  [address] Phone: [phone] Subjective:   [name], am serving as a scribe for Dr. Hulan Saas.    CC: Bilateral knee pain, back pain  GXQ:[id number]  ANNASTON [name] is a 62 y.o. female coming in with complaint of back pain. She is has been having low back pain that radiates down into hamstring bilaterally. Pain is chronic. Standing, walking, forward flexion increases her pain. Also has left hip joint pain and right knee pain. Patient was told that she has little cartilage in the right knee per Dr. Latanya Maudlin. He would like for her to have replacement but she would like to avoid replacement.  Patient last injection for greater than 2 years she states.  Since there is chronic swelling, has been wearing a custom brace on the right side for many many years.  Has helped but unfortunately continues to seem to be worsening.  Increasing swelling and pain.    Past Medical History:  Diagnosis Date  . Anemia    years ago  . Anxiety   . Blue toes    2nd toe on right foot, will get appt.  . Bulging disc   . CAD (coronary artery disease) 2009   s/p stent to LAD  . 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  Ablations done.  Now has rare episodes  . Chicken pox   . Chronic fatigue   . Coronary arteritis   . Degenerative disc disease   . Depression   . Diabetes mellitus without complication ([place])   . Facet joint disease   . Fibromyalgia   . Heart disease   . Hyperlipidemia   . IBS (irritable bowel syndrome)   . MCL deficiency, knee   . MRSA (methicillin resistant staph aureus) culture positive 2011   GREAT TOE RIGHT FOOT  . Neuropathy 04/18/2010  . Peripheral neuropathy   . Restless leg syndrome   . Sleep apnea 08/17/2003   uses CPAP, sleep study at [facility] (mild to moderate)  . Spinal stenosis    Past Surgical History:  Procedure Laterality Date  . ABLATION     UTERUS  .  ABLATION     HEART  . AMPUTATION TOE Right 02/01/2017   Procedure: AMPUTATION TOE-RIGHT 2ND MPJ;  Surgeon: Albertine Patricia, DPM;  Location: [facility];  Service: Podiatry;  Laterality: Right;  . ANTERIOR CERVICAL DECOMP/DISCECTOMY FUSION N/A 07/28/2014   Procedure: ANTERIOR CERVICAL DECOMPRESSION/DISCECTOMY FUSION CERVICAL 3-4,4-5,5-6 LEVELS WITH INSTRUMENTATION AND ALLOGRAFT;  Surgeon: Sinclair Ship, MD;  Location: [facility];  Service: Orthopedics;  Laterality: N/A;  Anterior cervical decompression fusion, cervical 3-4, cervical 4-5, cervical 5-6 with instrumentation and allograft  . BACK SURGERY     X 3 1979, 1994, 1995  . CARPAL TUNNEL RELEASE Right   . CHOLECYSTECTOMY  2003  . CORONARY ANGIOPLASTY    . EYE SURGERY Bilateral 2013   Eyelid lift   . FOOT SURGERY Right    BIG TOE  . FOOT SURGERY Bilateral    PLANTAR FASCIITIS  . FOOT SURGERY Right    2ND TOE  . GALLBLADDER SURGERY    . HAMMER TOE SURGERY Right 10/17/2017   Procedure: HAMMER TOE CORRECTION-4TH TOE;  Surgeon: Albertine Patricia, DPM;  Location: [facility];  Service: Podiatry;  Laterality: Right;  LMA- WITH LOCAL Diabetic - diet controlled  . HAND SURGERY Left   . HAND SURGEY Left   . HEART  STENT  2009   LAD  . INFUSION PUMP IMPLANTATION     X2 with morphine and baclofen  . IRRIGATION AND DEBRIDEMENT FOOT Right 02/23/2017   Procedure: IRRIGATION AND DEBRIDEMENT FOOT-right foot;  Surgeon: Samara Deist, DPM;  Location: [facility];  Service: Podiatry;  Laterality: Right;  . TOE SURGERY     then revision 8/18   Social History   Socioeconomic History  . Marital status: Significant Other    Spouse name: Not on file  . Number of children: Not on file  . Years of education: Not on file  . Highest education level: Not on file  Occupational History  . Not on file  Social Needs  . Financial resource strain: Not on file  . Food insecurity:    Worry: Not on file    Inability: Not on file  . Transportation needs:     Medical: Not on file    Non-medical: Not on file  Tobacco Use  . Smoking status: Former Smoker    Packs/day: 1.50    Years: 32.00    Pack years: 48.00    Types: Cigarettes    Last attempt to quit: 07/22/2007    Years since quitting: 10.6  . Smokeless tobacco: Never Used  Substance and Sexual Activity  . Alcohol use: Yes    Alcohol/week: 0.0 standard drinks    Comment: occ - Holidays  . Drug use: No  . Sexual activity: Yes  Lifestyle  . Physical activity:    Days per week: Not on file    Minutes per session: Not on file  . Stress: Not on file  Relationships  . Social connections:    Talks on phone: Not on file    Gets together: Not on file    Attends religious service: Not on file    Active member of club or organization: Not on file    Attends meetings of clubs or organizations: Not on file    Relationship status: Not on file  Other Topics Concern  . Not on file  Social History Narrative   Lives at home with a partner. Independent at baseline   No Known Allergies Family History  Problem Relation Age of Onset  . Lung cancer Mother   . Diabetes Father   . Heart disease Maternal Grandfather   . Diabetes Paternal Grandmother   . Colon cancer Paternal Grandfather   . Stroke Neg Hx     Current Outpatient Medications (Endocrine & Metabolic):  .  glipiZIDE (GLUCOTROL XL) 2.5 MG 24 hr tablet, TAKE 1 TABLET BY MOUTH EVERY DAY WITH BREAKFAST  Current Outpatient Medications (Cardiovascular):  .  nitroGLYCERIN (NITROSTAT) 0.4 MG SL tablet, Place 1 tablet (0.4 mg total) under the tongue every 5 (five) minutes as needed for chest pain. .  rosuvastatin (CRESTOR) 20 MG tablet, Take 1 tablet (20 mg total) by mouth daily.   Current Outpatient Medications (Analgesics):  .  aspirin 81 MG tablet, Take 81 mg by mouth daily. .  meloxicam (MOBIC) 15 MG tablet, Take 1 tablet (15 mg total) by mouth daily. Derrill Memo ON 04/04/2018] oxyCODONE (OXY IR/ROXICODONE) 5 MG immediate release  tablet, Take 1 tablet (5 mg total) by mouth every 8 (eight) hours as needed for severe pain. Marland Kitchen  oxyCODONE (OXY IR/ROXICODONE) 5 MG immediate release tablet, Take 1 tablet (5 mg total) by mouth every 8 (eight) hours as needed for severe pain. Marland Kitchen  oxyCODONE (OXY IR/ROXICODONE) 5 MG immediate release tablet, Take 1 tablet (  5 mg total) by mouth every 8 (eight) hours as needed for severe pain.   Current Outpatient Medications (Other):  Marland Kitchen  ACCU-CHEK SOFTCLIX LANCETS lancets, Use to check blood sugar 1 time daily.  Dx: E11.42 .  AMBULATORY NON FORMULARY MEDICATION, Medication Name: CPAP MASK OF CHOICE FOR HOME DEVICE .  buPROPion (WELLBUTRIN XL) 150 MG 24 hr tablet, Take 1 tablet (150 mg total) by mouth daily. MUST SCHEDULE ANNUAL EXAM .  Cholecalciferol (VITAMIN D3) 2000 units TABS, Take 2,000 Units by mouth daily. .  Chromium Picolinate 800 MCG TABS, Take 800 mcg by mouth daily. Verneita Griffes Bark POWD, Take 1 capsule by mouth daily. .  Coenzyme Q10 (CO Q 10) 100 MG CAPS, Take 200 mg by mouth daily.  Marland Kitchen  glucosamine-chondroitin 500-400 MG tablet, Take 1 tablet by mouth daily.  Marland Kitchen  glucose blood (ACCU-CHEK AVIVA PLUS) test strip, TEST 1 TIME DAILY  **E11.9** .  MAGNESIUM MALATE PO, Take 115 mg by mouth daily. .  Multiple Vitamin (MULTIVITAMIN) tablet, Take 1 tablet by mouth daily. .  naloxone (NARCAN) 2 MG/2ML injection, Inject 1 mL (1 mg total) into the muscle as needed (for opioid overdose). Inject content of syringe into thigh muscle. Call 911. .  NONFORMULARY OR COMPOUNDED ITEM, 147.95 mcg by Epidural Infusion route Continuous EPIDURAL. Medtronic Neuromodulation pump Fentanyl 147.95 mcg, Baclofen, bupivicaine .  NONFORMULARY OR COMPOUNDED ITEM, 88.77 mcg by Epidural Infusion route Continuous EPIDURAL. Medtronic Neuromodulation pump: Fentanyl, baclofen 88.77, bupivicane .  NONFORMULARY OR COMPOUNDED ITEM, 5.918 mg by Epidural Infusion route Continuous EPIDURAL. Medtronic Neuromodulation pump: Fentanyl,  Baclofen, Bupivicaine 5.918 mg .  Omega-3 Fatty Acids (FISH OIL) 1200 MG CAPS, Take 1,200 mg by mouth daily.  Marland Kitchen  PARoxetine (PAXIL) 20 MG tablet, TAKE ONE-HALF OF A TABLET BY MOUTH DAILY IN THE MORNING .  polyethylene glycol (MIRALAX / GLYCOLAX) packet, Take 17 g by mouth daily as needed (for constipation.).  Marland Kitchen  pregabalin (LYRICA) 100 MG capsule, Take 1 capsule (100 mg total) by mouth 3 (three) times daily. Marland Kitchen  tiZANidine (ZANAFLEX) 4 MG tablet, Take 1 tablet (4 mg total) by mouth 3 (three) times daily. .  vitamin E 400 UNIT capsule, Take 400 Units by mouth daily. .  Vitamin D, Ergocalciferol, (DRISDOL) 50000 units CAPS capsule, Take 1 capsule (50,000 Units total) by mouth every 7 (seven) days.    Past medical history, social, surgical and family history all reviewed in electronic medical record.  No pertanent information unless stated regarding to the chief complaint.   Review of Systems:  No headache, visual changes, nausea, vomiting, diarrhea, constipation, dizziness, abdominal pain, skin rash, fevers, chills, night sweats, weight loss, swollen lymph nodes,s, chest pain, shortness of breath, mood changes.  Positive muscle aches, body aches, joint swelling  Objective  Blood pressure 100/64, pulse 69, height 5\' 10"  (1.778 m), weight 235 lb (106.6 kg), SpO2 98 %.    General: No apparent distress alert and oriented x3 mood and affect normal, dressed appropriately.  Overweight HEENT: Pupils equal, extraocular movements intact  Respiratory: Patient's speak in full sentences and does not appear short of breath  Cardiovascular: Trace lower extremity edema, non tender, no erythema  Skin: Warm dry intact with no signs of infection or rash on extremities or on axial skeleton.  Abdomen: Soft mildly diffusely tender Neuro: Cranial nerves II through XII are intact, neurovascularly intact in all extremities with 2+ DTRs and 2+ pulses.  Lymph: No lymphadenopathy of posterior or anterior cervical chain  or axillae bilaterally.  Gait antalgic.  MSK: Diffuse and moderately tender with full range of motion and  and symmetric strength and tone of shoulders, elbows, wrist, hip, and ankles bilaterally.  Knee: Bilateral valgus deformity noted. Large thigh to calf ratio.  Tender to palpation over medial and PF joint line.  ROM full in flexion and extension and lower leg rotation. instability with valgus force.  painful patellar compression. Patellar glide with moderate crepitus. Patellar and quadriceps tendons unremarkable. Hamstring and quadriceps strength is normal.   Back exam shows poor core strength and loss of lordosis.  Patient is diffusely tender in the paraspinal musculature.  Patient has a fentanyl infusion pump intact no signs of any type of infection.  Positive straight leg test on the right at 25 degrees of forward flexion.  Radicular symptoms in the S1 distribution.  Patient does not appear to be neurovascularly intact and does have symmetric 4 out of 5 strength of the lower extremities at the ankles  After informed written and verbal consent, patient was seated on exam table. Right knee was prepped with alcohol swab and utilizing anterolateral approach, patient's right knee space was injected with 4:1  marcaine 0.5%: Kenalog 40mg /dL. Patient tolerated the procedure well without immediate complications.  After informed written and verbal consent, patient was seated on exam table. Left knee was prepped with alcohol swab and utilizing anterolateral approach, patient's left knee space was injected with 4:1  marcaine 0.5%: Kenalog 40mg /dL. Patient tolerated the procedure well without immediate complications.   Impression and Recommendations:     This case required medical decision making of moderate complexity. The above documentation has been reviewed and is accurate and complete Lyndal Pulley, DO       Note: This dictation was prepared with Dragon dictation along with smaller  phrase technology. Any transcriptional errors that result from this process are unintentional.

## 2018-03-12 ENCOUNTER — Ambulatory Visit (INDEPENDENT_AMBULATORY_CARE_PROVIDER_SITE_OTHER): Payer: Medicare Other | Admitting: Family Medicine

## 2018-03-12 ENCOUNTER — Encounter: Payer: Self-pay | Admitting: Family Medicine

## 2018-03-12 DIAGNOSIS — M4808 Spinal stenosis, sacral and sacrococcygeal region: Secondary | ICD-10-CM

## 2018-03-12 DIAGNOSIS — M961 Postlaminectomy syndrome, not elsewhere classified: Secondary | ICD-10-CM | POA: Diagnosis not present

## 2018-03-12 DIAGNOSIS — I208 Other forms of angina pectoris: Secondary | ICD-10-CM | POA: Diagnosis not present

## 2018-03-12 DIAGNOSIS — G894 Chronic pain syndrome: Secondary | ICD-10-CM

## 2018-03-12 DIAGNOSIS — M170 Bilateral primary osteoarthritis of knee: Secondary | ICD-10-CM | POA: Diagnosis not present

## 2018-03-12 MED ORDER — VITAMIN D (ERGOCALCIFEROL) 1.25 MG (50000 UNIT) PO CAPS: 50000.0000 [IU] | ORAL_CAPSULE | ORAL | 0 refills | Status: DC

## 2018-03-12 NOTE — Patient Instructions (Addendum)
Good to se eyou  We will order a MRI of the back  Injected the right knee Arnica lotion 2 times a day to the knee  Once weekly vitamin D for 12 weeks Over the counter get Turmeric 500mg  twice daily (stop the meloxicam ) Tart cherry extract any dose at night  See me again in 4 weeks and lets see what is going on .

## 2018-03-12 NOTE — Assessment & Plan Note (Signed)
Failed back surgical problems.  Patient has had on chronic pain medications as well as on a fentanyl drip at this time.  Multiple different medications contributing to decreasing quality of life.  Discussed with patient in great length.  At this moment encourage patient to try a new MRI for further evaluation.  Has failed multiple different types of injections.  May need potential fusion.  MRI ordered today for further evaluation.  Depending on findings we will discuss further medical management.  We will not make significant changes in any prescription medications.  Discussed which medications we would consider discontinuing in the near future.

## 2018-03-12 NOTE — Assessment & Plan Note (Signed)
Patient has had chronic pain is on chronic opiates at this time.  Will take significant amount of time to have patient come off 6 medications on 11 1.  Patient is adamant she thinks she would like to do this though.  We will see what patient responds to the injections in the knees as well as the advanced imaging.

## 2018-03-12 NOTE — Assessment & Plan Note (Signed)
Bilateral injections given today.  Tolerated the procedure well.  Discussed icing regimen and home exercises.  Discussed which activities of doing which wants to avoid.  Increase activity as tolerated.  Follow-up again in 4 weeks.  Could be a candidate for Visco supplementation

## 2018-03-13 ENCOUNTER — Encounter: Payer: Self-pay | Admitting: Family Medicine

## 2018-03-26 DIAGNOSIS — Z462 Encounter for fitting and adjustment of other devices related to nervous system and special senses: Secondary | ICD-10-CM | POA: Diagnosis not present

## 2018-03-26 DIAGNOSIS — M5136 Other intervertebral disc degeneration, lumbar region: Secondary | ICD-10-CM | POA: Diagnosis not present

## 2018-03-26 DIAGNOSIS — M4126 Other idiopathic scoliosis, lumbar region: Secondary | ICD-10-CM | POA: Diagnosis not present

## 2018-03-26 DIAGNOSIS — M4120 Other idiopathic scoliosis, site unspecified: Secondary | ICD-10-CM | POA: Insufficient documentation

## 2018-03-26 DIAGNOSIS — M51369 Other intervertebral disc degeneration, lumbar region without mention of lumbar back pain or lower extremity pain: Secondary | ICD-10-CM | POA: Insufficient documentation

## 2018-03-31 ENCOUNTER — Other Ambulatory Visit: Payer: Self-pay | Admitting: Anesthesiology

## 2018-04-01 ENCOUNTER — Encounter: Payer: Self-pay | Admitting: Family Medicine

## 2018-04-01 ENCOUNTER — Ambulatory Visit
Admission: RE | Admit: 2018-04-01 | Discharge: 2018-04-01 | Disposition: A | Discharge status: Home / Self Care | Payer: Medicare Other | Source: Ambulatory Visit | Attending: Family Medicine | Admitting: Family Medicine

## 2018-04-01 DIAGNOSIS — M48061 Spinal stenosis, lumbar region without neurogenic claudication: Secondary | ICD-10-CM | POA: Diagnosis not present

## 2018-04-01 DIAGNOSIS — M5116 Intervertebral disc disorders with radiculopathy, lumbar region: Secondary | ICD-10-CM | POA: Diagnosis not present

## 2018-04-01 DIAGNOSIS — M4808 Spinal stenosis, sacral and sacrococcygeal region: Secondary | ICD-10-CM | POA: Diagnosis present

## 2018-04-07 NOTE — Progress Notes (Signed)
[name] [place], [place] [address] Phone: [phone] Subjective:   [name], am serving as a scribe for Dr. Hulan Saas.  I'm seeing this patient by the request  of:    CC: Chronic back pain, bilateral leg and hip pain  XTG:GYIRSWNIOE  Melinda [name] is a 62 y.o. female coming in with complaint of back pain. She is having pain throughout the back into the left hip. Pain on lateral left hip. Pain has increased since last visit. Pain with weight bearing.   Patient was seen previously and having more radicular symptoms.  Patient was sent for a new MRI.  MRI was independently visualized by me.  MRI does show that patient has had moderate to severe spinal stenosis at L3-L4 but is fairly significant progression.  Patient also has moderate left lateral recess stenosis at L2-L3.  New spinal stenosis also noted from L1-L5 compared to previous exam though otherwise.     Past Medical History:  Diagnosis Date  . Anemia    years ago  . Anxiety   . Blue toes    2nd toe on right foot, will get appt.  . Bulging disc   . CAD (coronary artery disease) 2009   s/p stent to LAD  . 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  Ablations done.  Now has rare episodes  . Chicken pox   . Chronic fatigue   . Coronary arteritis   . Degenerative disc disease   . Depression   . Diabetes mellitus without complication ([place])   . Facet joint disease   . Fibromyalgia   . Heart disease   . Hyperlipidemia   . IBS (irritable bowel syndrome)   . MCL deficiency, knee   . MRSA (methicillin resistant staph aureus) culture positive 2011   GREAT TOE RIGHT FOOT  . Neuropathy 04/18/2010  . Peripheral neuropathy   . Restless leg syndrome   . Sleep apnea 08/17/2003   uses CPAP, sleep study at [facility] (mild to moderate)  . Spinal stenosis    Past Surgical History:  Procedure Laterality Date  . ABLATION     UTERUS  . ABLATION     HEART  . AMPUTATION TOE Right 02/01/2017   Procedure: AMPUTATION TOE-RIGHT 2ND MPJ;  Surgeon: Albertine Patricia, DPM;  Location: [facility];  Service: Podiatry;  Laterality: Right;  . ANTERIOR CERVICAL DECOMP/DISCECTOMY FUSION N/A 07/28/2014   Procedure: ANTERIOR CERVICAL DECOMPRESSION/DISCECTOMY FUSION CERVICAL 3-4,4-5,5-6 LEVELS WITH INSTRUMENTATION AND ALLOGRAFT;  Surgeon: Sinclair Ship, MD;  Location: [place];  Service: Orthopedics;  Laterality: N/A;  Anterior cervical decompression fusion, cervical 3-4, cervical 4-5, cervical 5-6 with instrumentation and allograft  . BACK SURGERY     X 3 1979, 1994, 1995  . CARPAL TUNNEL RELEASE Right   . CHOLECYSTECTOMY  2003  . CORONARY ANGIOPLASTY    . EYE SURGERY Bilateral 2013   Eyelid lift   . FOOT SURGERY Right    BIG TOE  . FOOT SURGERY Bilateral    PLANTAR FASCIITIS  . FOOT SURGERY Right    2ND TOE  . GALLBLADDER SURGERY    . HAMMER TOE SURGERY Right 10/17/2017   Procedure: HAMMER TOE CORRECTION-4TH TOE;  Surgeon: Albertine Patricia, DPM;  Location: [place];  Service: Podiatry;  Laterality: Right;  LMA- WITH LOCAL Diabetic - diet controlled  . HAND SURGERY Left   . HAND SURGEY Left   . HEART STENT  2009   LAD  . INFUSION PUMP IMPLANTATION     X2 with morphine and baclofen  . IRRIGATION AND DEBRIDEMENT FOOT Right 02/23/2017   Procedure: IRRIGATION AND DEBRIDEMENT FOOT-right foot;  Surgeon: Samara Deist, DPM;  Location: [facility];  Service: Podiatry;  Laterality: Right;  . TOE SURGERY     then revision 8/18   Social History   Socioeconomic History  . Marital status: Significant Other    Spouse name: Not on file  . Number of children: Not on file  . Years of education: Not on file  . Highest education level: Not on file  Occupational History  . Not on file  Social Needs  . Financial resource strain: Not on file  . Food insecurity:    Worry: Not on file    Inability: Not on file  . Transportation needs:    Medical:  Not on file    Non-medical: Not on file  Tobacco Use  . Smoking status: Former Smoker    Packs/day: 1.50    Years: 32.00    Pack years: 48.00    Types: Cigarettes    Last attempt to quit: 07/22/2007    Years since quitting: 10.7  . Smokeless tobacco: Never Used  Substance and Sexual Activity  . Alcohol use: Yes    Alcohol/week: 0.0 standard drinks    Comment: occ - Holidays  . Drug use: No  . Sexual activity: Yes  Lifestyle  . Physical activity:    Days per week: Not on file    Minutes per session: Not on file  . Stress: Not on file  Relationships  . Social connections:    Talks on phone: Not on file    Gets together: Not on file    Attends religious service: Not on file    Active member of club or organization: Not on file    Attends meetings of clubs or organizations: Not on file    Relationship status: Not on file  Other Topics Concern  . Not on file  Social History Narrative   Lives at home with a partner. Independent at baseline   No Known Allergies Family History  Problem Relation Age of Onset  . Lung cancer Mother   . Diabetes Father   . Heart disease Maternal Grandfather   . Diabetes Paternal Grandmother   . Colon cancer Paternal Grandfather   . Stroke Neg Hx     Current Outpatient Medications (Endocrine & Metabolic):  .  glipiZIDE (GLUCOTROL XL) 2.5 MG 24 hr tablet, TAKE 1 TABLET BY MOUTH EVERY DAY WITH BREAKFAST  Current Outpatient Medications (Cardiovascular):  .  nitroGLYCERIN (NITROSTAT) 0.4 MG SL tablet, Place 1 tablet (0.4 mg total) under the tongue every 5 (five) minutes as needed for chest pain. .  rosuvastatin (CRESTOR) 20 MG tablet, Take 1 tablet (20 mg total) by mouth daily.   Current Outpatient Medications (Analgesics):  .  aspirin 81 MG tablet, Take 81 mg by mouth daily. Marland Kitchen  oxyCODONE (OXY IR/ROXICODONE) 5 MG immediate release tablet, Take 1 tablet (5 mg total) by mouth every 8 (eight) hours as needed for severe pain. Marland Kitchen  oxyCODONE (OXY  IR/ROXICODONE) 5 MG immediate release tablet, Take 1 tablet (5 mg total) by mouth every 8 (eight) hours as needed for severe pain. Marland Kitchen  oxyCODONE (OXY IR/ROXICODONE) 5 MG immediate release tablet, Take 1 tablet (5 mg total) by mouth every 8 (eight) hours as needed for severe pain.   Current Outpatient Medications (Other):  .  ACCU-CHEK SOFTCLIX LANCETS lancets, Use to check blood sugar 1 time daily.  Dx: E11.42 .  AMBULATORY NON FORMULARY MEDICATION, Medication Name: CPAP MASK OF CHOICE FOR HOME DEVICE .  buPROPion (WELLBUTRIN XL) 150 MG 24 hr tablet, Take 1 tablet (150 mg total) by mouth daily. MUST SCHEDULE ANNUAL EXAM .  Chromium Picolinate 800 MCG TABS, Take 800 mcg by mouth daily. Verneita Griffes Bark POWD, Take 1 capsule by mouth daily. .  Coenzyme Q10 (CO Q 10) 100 MG CAPS, Take 200 mg by mouth daily.  Marland Kitchen  glucosamine-chondroitin 500-400 MG tablet, Take 1 tablet by mouth daily.  Marland Kitchen  glucose blood (ACCU-CHEK AVIVA PLUS) test strip, TEST 1 TIME DAILY  **E11.9** .  MAGNESIUM MALATE PO, Take 115 mg by mouth daily. .  Misc Natural Products (TART CHERRY ADVANCED PO), Take by mouth. .  Multiple Vitamin (MULTIVITAMIN) tablet, Take 1 tablet by mouth daily. .  naloxone (NARCAN) 2 MG/2ML injection, Inject 1 mL (1 mg total) into the muscle as needed (for opioid overdose). Inject content of syringe into thigh muscle. Call 911. .  NONFORMULARY OR COMPOUNDED ITEM, 147.95 mcg by Epidural Infusion route Continuous EPIDURAL. Medtronic Neuromodulation pump Fentanyl 147.95 mcg, Baclofen, bupivicaine .  NONFORMULARY OR COMPOUNDED ITEM, 88.77 mcg by Epidural Infusion route Continuous EPIDURAL. Medtronic Neuromodulation pump: Fentanyl, baclofen 88.77, bupivicane .  NONFORMULARY OR COMPOUNDED ITEM, 5.918 mg by Epidural Infusion route Continuous EPIDURAL. Medtronic Neuromodulation pump: Fentanyl, Baclofen, Bupivicaine 5.918 mg .  Omega-3 Fatty Acids (FISH OIL) 1200 MG CAPS, Take 1,200 mg by mouth daily.  Marland Kitchen  PARoxetine  (PAXIL) 20 MG tablet, TAKE ONE-HALF OF A TABLET BY MOUTH DAILY IN THE MORNING .  polyethylene glycol (MIRALAX / GLYCOLAX) packet, Take 17 g by mouth daily as needed (for constipation.).  Marland Kitchen  pregabalin (LYRICA) 100 MG capsule, Take 1 capsule (100 mg total) by mouth 3 (three) times daily. Marland Kitchen  tiZANidine (ZANAFLEX) 4 MG tablet, Take 1 tablet (4 mg total) by mouth 3 (three) times daily. .  TURMERIC PO, Take by mouth. .  Vitamin D, Ergocalciferol, (DRISDOL) 50000 units CAPS capsule, Take 1 capsule (50,000 Units total) by mouth every 7 (seven) days. .  vitamin E 400 UNIT capsule, Take 400 Units by mouth daily.    Past medical history, social, surgical and family history all reviewed in electronic medical record.  No pertanent information unless stated regarding to the chief complaint.   Review of Systems:  No headache, visual changes, nausea, vomiting, diarrhea, constipation, dizziness, abdominal pain, skin rash, fevers, chills, night sweats, weight loss, swollen lymph nodes, body aches, joint swelling, muscle aches, chest pain, shortness of breath, mood changes.   Objective  Blood pressure 102/74, pulse 66, height 5\' 10"  (1.778 m), weight 234 lb (106.1 kg), SpO2 98 %.    General: No apparent distress alert and oriented x3 mood and affect normal, dressed appropriately.  HEENT: Pupils equal, extraocular movements intact  Respiratory: Patient's speak in full sentences and does not appear short of breath  Cardiovascular: Trace lower extremity edema, non tender, no erythema  Skin: Warm dry intact with no signs of infection or rash on extremities or on axial skeleton.  Abdomen: Soft nontender poor core strength Neuro: Cranial nerves II through XII are intact, neurovascularly intact in all extremities  2+ pulses.  Lymph: No lymphadenopathy of posterior or anterior cervical chain or axillae bilaterally.  Gait antalgic MSK:  Non tender with full range of motion and good stability and symmetric strength  and  tone of shoulders, elbows, wrist, hip, knee and ankles bilaterally.  Custom brace noted on the right knee  Back exam shows loss of lordosis.  Mild to moderate tenderness to palpation of the paraspinal musculature.  Positive radicular symptoms with straight leg test on the left side at 25 degrees of forward flexion of the hip.  Deep tendon reflexes 1+ but symmetric of the lower extremities.  Trace effusion in the lower extremities.  Moderate tenderness over the lateral aspect of the left hip.   Impression and Recommendations:     The above documentation has been reviewed and is accurate and complete Lyndal Pulley, DO       Note: This dictation was prepared with Dragon dictation along with smaller phrase technology. Any transcriptional errors that result from this process are unintentional.

## 2018-04-09 ENCOUNTER — Ambulatory Visit (INDEPENDENT_AMBULATORY_CARE_PROVIDER_SITE_OTHER): Payer: Medicare Other | Admitting: Family Medicine

## 2018-04-09 DIAGNOSIS — I208 Other forms of angina pectoris: Secondary | ICD-10-CM | POA: Diagnosis not present

## 2018-04-09 DIAGNOSIS — M48062 Spinal stenosis, lumbar region with neurogenic claudication: Secondary | ICD-10-CM | POA: Insufficient documentation

## 2018-04-09 NOTE — Assessment & Plan Note (Signed)
Discussed with patient in great length.  I do feel that patient's symptoms is consistent with the L3-L4 spinal stenosis.  Lumbar's also shows significant progression from patient's previous MRI in 2015.  Patient has been going to pain management and unable to get those records.  Patient though is actually set up to have a intrathecal pump repair and/or replacement in the near future with Dr. Maryjean Ka.  Discussed with her that would usually refer to Dr. Maryjean Ka to see if an epidural or other possible injections could be beneficial.  Patient has had mixed results with epidurals previously.  We will send my note to him to see if any further treatment options could be readily available instead of patient having another surgical intervention.  Patient though knows that she may need surgical intervention in the near future though.  Discussed with her at this time no other significant changes in medications.  Patient is in agreement with this.  Follow-up with me again as needed spent  25 minutes with patient face-to-face and had greater than 50% of counseling including as described above in assessment and plan.

## 2018-04-09 NOTE — Patient Instructions (Signed)
Good to see you  Ice is your friend I do see that you have significant progression of your spinal stenosis at L3-L4.  This is contributing to the bilateral hip and leg pain that you do have.  I believe it severe enough that potential epidural would be helpful but surgical intervention may be necessary.  Please discuss with Dr. Maryjean Ka and see what he thinks would be best.  I am here if you have questions.

## 2018-04-10 ENCOUNTER — Encounter: Payer: Self-pay | Admitting: Family Medicine

## 2018-04-16 ENCOUNTER — Other Ambulatory Visit: Payer: Self-pay | Admitting: Internal Medicine

## 2018-04-16 NOTE — Pre-Procedure Instructions (Signed)
SHERILEE SMOTHERMAN  04/16/2018      [facility] [place] [address], [place] [address] [place] [address] Phone: [phone] Fax: [phone]    Your procedure is scheduled on Oct. 25  Report to [place] at 8;00 A.M.  Call this number if you have problems the morning of surgery:  [phone]   Remember:   Do not eat or drink after midnight.      Take these medicines the morning of surgery with A SIP OF WATER :             Bupropion (wellbutrin)            Nitroglycerine if needed            Oxycodone if needed            Paroxetine (paxil)            pregabalin (lyrica)            Tizanidine (zanaflex) if needed             7 days prior to surgery STOP taking any Aspirin(unless otherwise instructed by your surgeon), Aleve, Naproxen, Ibuprofen, Motrin, Advil, Goody's, BC's, all herbal medications, fish oil, and all vitamins             Follow your surgeon's instructions on when to stop Asprin.  If no instructions were given by your surgeon then you will need to call the office to get those instructions.                   How to Manage Your Diabetes Before and After Surgery  Why is it important to control my blood sugar before and after surgery? . Improving blood sugar levels before and after surgery helps healing and can limit problems. . A way of improving blood sugar control is eating a healthy diet by: o  Eating less sugar and carbohydrates o  Increasing activity/exercise o  Talking with your doctor about reaching your blood sugar goals . High blood sugars (greater than 180 mg/dL) can raise your risk of infections and slow your recovery, so you will need to focus on controlling your diabetes during the weeks before surgery. . Make sure that the doctor who takes care of your diabetes knows about your planned surgery including the date and location.  How do I manage my blood sugar before surgery? . Check your  blood sugar at least 4 times a day, starting 2 days before surgery, to make sure that the level is not too high or low. o Check your blood sugar the morning of your surgery when you wake up and every 2 hours until you get to the Short Stay unit. . If your blood sugar is less than 70 mg/dL, you will need to treat for low blood sugar: o Do not take insulin. o Treat a low blood sugar (less than 70 mg/dL) with  cup of clear juice (cranberry or apple), 4 glucose tablets, OR glucose gel. Recheck blood sugar in 15 minutes after treatment (to make sure it is greater than 70 mg/dL). If your blood sugar is not greater than 70 mg/dL on recheck, call [phone] o  for further instructions. . Report your blood sugar to the short stay nurse when you get to Short Stay.  . If you are admitted to the hospital after surgery: o Your blood sugar will be checked by the staff and  you will probably be given insulin after surgery (instead of oral diabetes medicines) to make sure you have good blood sugar levels. o The goal for blood sugar control after surgery is 80-180 mg/dL.       WHAT DO I DO ABOUT MY DIABETES MEDICATION?   Marland Kitchen Do not take oral diabetes medicines (pills) the morning of surgery.                 Do not wear jewelry, make-up or nail polish.  Do not wear lotions, powders, or perfumes, or deodorant.  Do not shave 48 hours prior to surgery.  Men may shave face and neck.  Do not bring valuables to the hospital.  [facility] is not responsible for any belongings or valuables.  Contacts, dentures or bridgework may not be worn into surgery.  Leave your suitcase in the car.  After surgery it may be brought to your room.  For patients admitted to the hospital, discharge time will be determined by your treatment team.  Patients discharged the day of surgery will not be allowed to drive home.    Special instructions:   [facility]- Preparing For Surgery  Before surgery, you can play an  important role. Because skin is not sterile, your skin needs to be as free of germs as possible. You can reduce the number of germs on your skin by washing with CHG (chlorahexidine gluconate) Soap before surgery.  CHG is an antiseptic cleaner which kills germs and bonds with the skin to continue killing germs even after washing.    Oral Hygiene is also important to reduce your risk of infection.  Remember - BRUSH YOUR TEETH THE MORNING OF SURGERY WITH YOUR REGULAR TOOTHPASTE  Please do not use if you have an allergy to CHG or antibacterial soaps. If your skin becomes reddened/irritated stop using the CHG.  Do not shave (including legs and underarms) for at least 48 hours prior to first CHG shower. It is OK to shave your face.  Please follow these instructions carefully.   1. Shower the NIGHT BEFORE SURGERY and the MORNING OF SURGERY with CHG.   2. If you chose to wash your hair, wash your hair first as usual with your normal shampoo.  3. After you shampoo, rinse your hair and body thoroughly to remove the shampoo.  4. Use CHG as you would any other liquid soap. You can apply CHG directly to the skin and wash gently with a scrungie or a clean washcloth.   5. Apply the CHG Soap to your body ONLY FROM THE NECK DOWN.  Do not use on open wounds or open sores. Avoid contact with your eyes, ears, mouth and genitals (private parts). Wash Face and genitals (private parts)  with your normal soap.  6. Wash thoroughly, paying special attention to the area where your surgery will be performed.  7. Thoroughly rinse your body with warm water from the neck down.  8. DO NOT shower/wash with your normal soap after using and rinsing off the CHG Soap.  9. Pat yourself dry with a CLEAN TOWEL.  10. Wear CLEAN PAJAMAS to bed the night before surgery, wear comfortable clothes the morning of surgery  11. Place CLEAN SHEETS on your bed the night of your first shower and DO NOT SLEEP WITH PETS.    Day of  Surgery:  Do not apply any deodorants/lotions.  Please wear clean clothes to the hospital/surgery center.   Remember to brush your teeth WITH YOUR  REGULAR TOOTHPASTE.    Please read over the following fact sheets that you were given. Coughing and Deep Breathing, MRSA Information and Surgical Site Infection Prevention

## 2018-04-17 ENCOUNTER — Encounter (HOSPITAL_COMMUNITY)
Admission: RE | Admit: 2018-04-17 | Discharge: 2018-04-17 | Disposition: A | Discharge status: Home / Self Care | Payer: Medicare Other | Source: Ambulatory Visit | Attending: Anesthesiology | Admitting: Anesthesiology

## 2018-04-17 ENCOUNTER — Encounter (HOSPITAL_COMMUNITY): Payer: Self-pay

## 2018-04-17 ENCOUNTER — Other Ambulatory Visit: Payer: Self-pay

## 2018-04-17 DIAGNOSIS — K589 Irritable bowel syndrome without diarrhea: Secondary | ICD-10-CM | POA: Diagnosis not present

## 2018-04-17 DIAGNOSIS — F419 Anxiety disorder, unspecified: Secondary | ICD-10-CM | POA: Diagnosis not present

## 2018-04-17 DIAGNOSIS — Z7982 Long term (current) use of aspirin: Secondary | ICD-10-CM | POA: Diagnosis not present

## 2018-04-17 DIAGNOSIS — G4733 Obstructive sleep apnea (adult) (pediatric): Secondary | ICD-10-CM | POA: Diagnosis not present

## 2018-04-17 DIAGNOSIS — E785 Hyperlipidemia, unspecified: Secondary | ICD-10-CM | POA: Diagnosis not present

## 2018-04-17 DIAGNOSIS — I2510 Atherosclerotic heart disease of native coronary artery without angina pectoris: Secondary | ICD-10-CM | POA: Diagnosis not present

## 2018-04-17 DIAGNOSIS — Z79899 Other long term (current) drug therapy: Secondary | ICD-10-CM | POA: Insufficient documentation

## 2018-04-17 DIAGNOSIS — Z01818 Encounter for other preprocedural examination: Secondary | ICD-10-CM | POA: Diagnosis not present

## 2018-04-17 DIAGNOSIS — E119 Type 2 diabetes mellitus without complications: Secondary | ICD-10-CM | POA: Insufficient documentation

## 2018-04-17 HISTORY — DX: Complete traumatic amputation of one right lesser toe, initial encounter: S98.131A

## 2018-04-17 HISTORY — DX: Chronic kidney disease, unspecified: N18.9

## 2018-04-17 HISTORY — DX: Complete traumatic amputation of right great toe, initial encounter: S98.111A

## 2018-04-17 HISTORY — DX: Sepsis, unspecified organism: A41.9

## 2018-04-17 LAB — CBC
HCT: 43.2 % (ref 36.0–46.0)
Hemoglobin: 13.5 g/dL (ref 12.0–15.0)
MCH: 27.8 pg (ref 26.0–34.0)
MCHC: 31.3 g/dL (ref 30.0–36.0)
MCV: 89.1 fL (ref 80.0–100.0)
Platelets: 223 10*3/uL (ref 150–400)
RBC: 4.85 MIL/uL (ref 3.87–5.11)
RDW: 12.3 % (ref 11.5–15.5)
WBC: 5.9 10*3/uL (ref 4.0–10.5)
nRBC: 0 % (ref 0.0–0.2)

## 2018-04-17 LAB — BASIC METABOLIC PANEL
Anion gap: 12 (ref 5–15)
BUN: 34 mg/dL — ABNORMAL HIGH (ref 6–20)
CO2: 28 mmol/L (ref 22–32)
Calcium: 9.7 mg/dL (ref 8.9–10.3)
Chloride: 103 mmol/L (ref 98–111)
Creatinine, Ser: 1.08 mg/dL — ABNORMAL HIGH (ref 0.44–1.00)
GFR calc Af Amer: >60 mL/min (ref >60)
GFR calc non Af Amer: 55 mL/min — ABNORMAL LOW (ref >60)
Glucose, Bld: 85 mg/dL (ref 70–99)
Potassium: 5 mmol/L (ref 3.5–5.1)
Sodium: 143 mmol/L (ref 135–145)

## 2018-04-17 LAB — GLUCOSE, CAPILLARY: Glucose-Capillary: 92 mg/dL (ref 70–99)

## 2018-04-17 LAB — SURGICAL PCR SCREEN
MRSA, PCR: NEGATIVE
Staphylococcus aureus: POSITIVE — AB

## 2018-04-17 LAB — HEMOGLOBIN A1C
Hgb A1c MFr Bld: 5.7 % — ABNORMAL HIGH (ref 4.8–5.6)
Mean Plasma Glucose: 116.89 mg/dL

## 2018-04-17 NOTE — Progress Notes (Signed)
PCP - [name], NP Cardiologist - Dr. Marlou Porch  Chest x-ray - n/a EKG - 04/17/2018 Stress Test - 2014 ECHO - 08/2015 Cardiac Cath - 2009  Sleep Study - patient unsure when and where she had it done CPAP - yes, told to bring CPAP mask  Fasting Blood Sugar - 130-150's Checks Blood Sugar 1 time a day  Anesthesia review:  Yes, cardiac hx  Patient denies shortness of breath, fever, cough and chest pain at PAT appointment   Patient verbalized understanding of instructions that were given to them at the PAT appointment. Patient was also instructed that they will need to review over the PAT instructions again at home before surgery.

## 2018-04-18 NOTE — Progress Notes (Signed)
Anesthesia Chart Review:   Case:  [id number] Date/Time:  04/25/18 0945   Procedure:  Intrathecal pump replacement (N/A ) - Intrathecal pump replacement   Anesthesia type:  General   Pre-op diagnosis:  end of battery life of intrathecal infusion pump   Location:  [facility] OR ROOM 14 / [place] [facility]   Surgeon:  Clydell Hakim, MD      DISCUSSION:  - Pt is a 62 year old female with hx CAD (DES to LAD 2009), DM, OSA.    VS: BP (!) 145/70   Pulse 78   Temp 36.8 C   Resp 20   Ht 5\' 10"  (1.778 m)   Wt 103.5 kg   SpO2 95%   BMI 32.74 kg/m    PROVIDERS: - PCP is Jearld Fenton, NP - Cardiologist is Candee Furbish, MD. Last office visit 02/10/18   LABS: Labs reviewed: Acceptable for surgery. (all labs ordered are listed, but only abnormal results are displayed)  Labs Reviewed  SURGICAL PCR SCREEN - Abnormal; Notable for the following components:      Result Value   Staphylococcus aureus POSITIVE (*)    All other components within normal limits  HEMOGLOBIN A1C - Abnormal; Notable for the following components:   Hgb A1c MFr Bld 5.7 (*)    All other components within normal limits  BASIC METABOLIC PANEL - Abnormal; Notable for the following components:   BUN 34 (*)    Creatinine, Ser 1.08 (*)    GFR calc non Af Amer 55 (*)    All other components within normal limits  GLUCOSE, CAPILLARY  CBC    EKG 04/17/18: NSR   CV:   Echo 09/12/15:  - Left ventricle: The cavity size was normal. Wall thickness was normal. Systolic function was vigorous. The estimated ejection fraction was in the range of 65% to 70%. Wall motion was normal; there were no regional wall motion abnormalities. There was no evidence of elevated ventricular filling pressure by Doppler parameters. - Left atrium: The atrium was mildly dilated.  Carotid duplex 06/28/15:  - Stable 40-59% bilateral ICA stenosis. - Stable >50% bilateral ECA stenosis. - Normal subclavian arteries, bilaterally. - Patent vertebral arteries with  antegrade flow.  - Cardiac cath 02/27/11:  1. Previously placed mid LAD drug-eluting stent in 2009 is widely patent.  There is only mild luminal irregularity at the diagonal branch, which is covered by the stent.  Otherwise, widely patent coronary arteries. 2. Normal left ventricular ejection fraction of 60% with no wall motion abnormalities without any significant mitral regurgitation or aortic stenosis detected.    Past Medical History:  Diagnosis Date  . Amputation of great toe, right, traumatic ([place]) 05/30/2010  . Amputation of second toe, right, traumatic ([place]) 09/2017  . Anemia    years ago  . Anxiety   . Blue toes    2nd toe on right foot, will get appt.  . Bulging disc   . CAD (coronary artery disease) 2009   s/p stent to LAD  . 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  Ablations done.  Now has rare episodes  . Chicken pox   . Chronic fatigue   . Chronic kidney disease    "problem with kidney filtration"  . Coronary arteritis   . Degenerative disc disease   . Depression   . Diabetes mellitus without complication ([place])   . Facet joint disease   . Fibromyalgia   . Heart disease   .  Hyperlipidemia   . IBS (irritable bowel syndrome)   . MCL deficiency, knee   . MRSA (methicillin resistant staph aureus) culture positive 2011   GREAT TOE RIGHT FOOT  . Neuropathy 04/18/2010  . Peripheral neuropathy   . Restless leg syndrome   . Sepsis ([place]) 09/2013  . Sleep apnea 08/17/2003   uses CPAP, sleep study at [facility] (mild to moderate)  . Spinal stenosis     Past Surgical History:  Procedure Laterality Date  . ABLATION     UTERUS  . ABLATION     HEART  . AMPUTATION TOE Right 02/01/2017   Procedure: AMPUTATION TOE-RIGHT 2ND MPJ;  Surgeon: Albertine Patricia, DPM;  Location: [facility] ORS;  Service: Podiatry;  Laterality: Right;  . ANTERIOR CERVICAL DECOMP/DISCECTOMY FUSION N/A 07/28/2014   Procedure: ANTERIOR CERVICAL DECOMPRESSION/DISCECTOMY FUSION  CERVICAL 3-4,4-5,5-6 LEVELS WITH INSTRUMENTATION AND ALLOGRAFT;  Surgeon: Sinclair Ship, MD;  Location: [place];  Service: Orthopedics;  Laterality: N/A;  Anterior cervical decompression fusion, cervical 3-4, cervical 4-5, cervical 5-6 with instrumentation and allograft  . BACK SURGERY     X 3 1979, 1994, 1995  . CARPAL TUNNEL RELEASE Right   . CHOLECYSTECTOMY  2003  . CORONARY ANGIOPLASTY    . EYE SURGERY Bilateral 2013   Eyelid lift   . FOOT SURGERY Right    BIG TOE  . FOOT SURGERY Bilateral    PLANTAR FASCIITIS  . FOOT SURGERY Right    2ND TOE  . GALLBLADDER SURGERY    . HAMMER TOE SURGERY Right 10/17/2017   Procedure: HAMMER TOE CORRECTION-4TH TOE;  Surgeon: Albertine Patricia, DPM;  Location: [place];  Service: Podiatry;  Laterality: Right;  LMA- WITH LOCAL Diabetic - diet controlled  . HAND SURGERY Left   . HAND SURGEY Left   . HEART STENT  2009   LAD  . INFUSION PUMP IMPLANTATION     X2 with morphine and baclofen  . IRRIGATION AND DEBRIDEMENT FOOT Right 02/23/2017   Procedure: IRRIGATION AND DEBRIDEMENT FOOT-right foot;  Surgeon: Samara Deist, DPM;  Location: [facility] ORS;  Service: Podiatry;  Laterality: Right;  . TOE SURGERY     then revision 8/18    MEDICATIONS: . ACCU-CHEK AVIVA PLUS test strip  . ACCU-CHEK SOFTCLIX LANCETS lancets  . AMBULATORY NON FORMULARY MEDICATION  . aspirin 81 MG tablet  . buPROPion (WELLBUTRIN XL) 150 MG 24 hr tablet  . Cinnamon 500 MG TABS  . Coenzyme Q10 (CO Q 10) 100 MG CAPS  . glipiZIDE (GLUCOTROL XL) 2.5 MG 24 hr tablet  . glucosamine-chondroitin 500-400 MG tablet  . Magnesium 250 MG TABS  . Misc Natural Products (TART CHERRY ADVANCED PO)  . Multiple Vitamin (MULTIVITAMIN) tablet  . naloxone [facility]) 2 MG/2ML injection  . nitroGLYCERIN (NITROSTAT) 0.4 MG SL tablet  . NONFORMULARY OR COMPOUNDED ITEM  . NONFORMULARY OR COMPOUNDED ITEM  . NONFORMULARY OR COMPOUNDED ITEM  . Omega-3 Fatty Acids (FISH OIL PO)  . oxyCODONE  (OXY IR/ROXICODONE) 5 MG immediate release tablet  . oxyCODONE (OXY IR/ROXICODONE) 5 MG immediate release tablet  . oxyCODONE (OXY IR/ROXICODONE) 5 MG immediate release tablet  . PARoxetine (PAXIL) 20 MG tablet  . polyethylene glycol (MIRALAX / GLYCOLAX) packet  . pregabalin (LYRICA) 100 MG capsule  . rosuvastatin (CRESTOR) 20 MG tablet  . tiZANidine (ZANAFLEX) 4 MG tablet  . TURMERIC PO  . Vitamin D, Ergocalciferol, (DRISDOL) 50000 units CAPS capsule  . vitamin E 400 UNIT capsule   No current facility-administered medications  for this encounter.     If no changes, I anticipate pt can proceed with surgery as scheduled.   Willeen Cass, FNP-BC [facility]/Anesthesiology Phone: [phone] 04/18/2018 1:05 PM

## 2018-04-23 DIAGNOSIS — M62838 Other muscle spasm: Secondary | ICD-10-CM | POA: Diagnosis not present

## 2018-04-23 DIAGNOSIS — M9901 Segmental and somatic dysfunction of cervical region: Secondary | ICD-10-CM | POA: Diagnosis not present

## 2018-04-23 DIAGNOSIS — M9903 Segmental and somatic dysfunction of lumbar region: Secondary | ICD-10-CM | POA: Diagnosis not present

## 2018-04-23 DIAGNOSIS — M9906 Segmental and somatic dysfunction of lower extremity: Secondary | ICD-10-CM | POA: Diagnosis not present

## 2018-04-23 DIAGNOSIS — M5136 Other intervertebral disc degeneration, lumbar region: Secondary | ICD-10-CM | POA: Diagnosis not present

## 2018-04-23 DIAGNOSIS — M5032 Other cervical disc degeneration, mid-cervical region, unspecified level: Secondary | ICD-10-CM | POA: Diagnosis not present

## 2018-04-24 DIAGNOSIS — M62838 Other muscle spasm: Secondary | ICD-10-CM | POA: Diagnosis not present

## 2018-04-24 DIAGNOSIS — M9901 Segmental and somatic dysfunction of cervical region: Secondary | ICD-10-CM | POA: Diagnosis not present

## 2018-04-24 DIAGNOSIS — M5136 Other intervertebral disc degeneration, lumbar region: Secondary | ICD-10-CM | POA: Diagnosis not present

## 2018-04-24 DIAGNOSIS — M9906 Segmental and somatic dysfunction of lower extremity: Secondary | ICD-10-CM | POA: Diagnosis not present

## 2018-04-24 DIAGNOSIS — M9903 Segmental and somatic dysfunction of lumbar region: Secondary | ICD-10-CM | POA: Diagnosis not present

## 2018-04-24 DIAGNOSIS — M5032 Other cervical disc degeneration, mid-cervical region, unspecified level: Secondary | ICD-10-CM | POA: Diagnosis not present

## 2018-04-25 ENCOUNTER — Ambulatory Visit (HOSPITAL_COMMUNITY): Payer: Medicare Other | Admitting: Certified Registered"

## 2018-04-25 ENCOUNTER — Other Ambulatory Visit: Payer: Self-pay

## 2018-04-25 ENCOUNTER — Encounter (HOSPITAL_COMMUNITY)
Admission: RE | Disposition: A | Discharge status: Home / Self Care | Payer: Self-pay | Source: Ambulatory Visit | Attending: Anesthesiology

## 2018-04-25 ENCOUNTER — Ambulatory Visit (HOSPITAL_COMMUNITY)
Admission: RE | Admit: 2018-04-25 | Discharge: 2018-04-25 | Disposition: A | Discharge status: Home / Self Care | Payer: Medicare Other | Source: Ambulatory Visit | Attending: Anesthesiology | Admitting: Anesthesiology

## 2018-04-25 ENCOUNTER — Ambulatory Visit (HOSPITAL_COMMUNITY): Payer: Medicare Other | Admitting: Emergency Medicine

## 2018-04-25 ENCOUNTER — Encounter (HOSPITAL_COMMUNITY): Payer: Self-pay

## 2018-04-25 DIAGNOSIS — G894 Chronic pain syndrome: Secondary | ICD-10-CM | POA: Diagnosis not present

## 2018-04-25 DIAGNOSIS — M419 Scoliosis, unspecified: Secondary | ICD-10-CM | POA: Diagnosis not present

## 2018-04-25 DIAGNOSIS — I456 Pre-excitation syndrome: Secondary | ICD-10-CM | POA: Diagnosis not present

## 2018-04-25 DIAGNOSIS — Z451 Encounter for adjustment and management of infusion pump: Secondary | ICD-10-CM | POA: Diagnosis not present

## 2018-04-25 DIAGNOSIS — N189 Chronic kidney disease, unspecified: Secondary | ICD-10-CM | POA: Insufficient documentation

## 2018-04-25 DIAGNOSIS — G8929 Other chronic pain: Secondary | ICD-10-CM | POA: Insufficient documentation

## 2018-04-25 DIAGNOSIS — T85695A Other mechanical complication of other nervous system device, implant or graft, initial encounter: Secondary | ICD-10-CM | POA: Diagnosis not present

## 2018-04-25 DIAGNOSIS — K589 Irritable bowel syndrome without diarrhea: Secondary | ICD-10-CM | POA: Insufficient documentation

## 2018-04-25 DIAGNOSIS — E1122 Type 2 diabetes mellitus with diabetic chronic kidney disease: Secondary | ICD-10-CM | POA: Diagnosis not present

## 2018-04-25 DIAGNOSIS — Z7982 Long term (current) use of aspirin: Secondary | ICD-10-CM | POA: Insufficient documentation

## 2018-04-25 DIAGNOSIS — G4730 Sleep apnea, unspecified: Secondary | ICD-10-CM | POA: Diagnosis not present

## 2018-04-25 DIAGNOSIS — M4800 Spinal stenosis, site unspecified: Secondary | ICD-10-CM | POA: Insufficient documentation

## 2018-04-25 DIAGNOSIS — M9906 Segmental and somatic dysfunction of lower extremity: Secondary | ICD-10-CM | POA: Diagnosis not present

## 2018-04-25 DIAGNOSIS — E1140 Type 2 diabetes mellitus with diabetic neuropathy, unspecified: Secondary | ICD-10-CM | POA: Insufficient documentation

## 2018-04-25 DIAGNOSIS — Z89421 Acquired absence of other right toe(s): Secondary | ICD-10-CM | POA: Diagnosis not present

## 2018-04-25 DIAGNOSIS — M797 Fibromyalgia: Secondary | ICD-10-CM | POA: Diagnosis not present

## 2018-04-25 DIAGNOSIS — Z955 Presence of coronary angioplasty implant and graft: Secondary | ICD-10-CM | POA: Insufficient documentation

## 2018-04-25 DIAGNOSIS — Z981 Arthrodesis status: Secondary | ICD-10-CM | POA: Insufficient documentation

## 2018-04-25 DIAGNOSIS — I2510 Atherosclerotic heart disease of native coronary artery without angina pectoris: Secondary | ICD-10-CM | POA: Diagnosis not present

## 2018-04-25 DIAGNOSIS — Z7984 Long term (current) use of oral hypoglycemic drugs: Secondary | ICD-10-CM | POA: Insufficient documentation

## 2018-04-25 DIAGNOSIS — Z87891 Personal history of nicotine dependence: Secondary | ICD-10-CM | POA: Insufficient documentation

## 2018-04-25 DIAGNOSIS — M5136 Other intervertebral disc degeneration, lumbar region: Secondary | ICD-10-CM | POA: Diagnosis not present

## 2018-04-25 DIAGNOSIS — E785 Hyperlipidemia, unspecified: Secondary | ICD-10-CM | POA: Insufficient documentation

## 2018-04-25 DIAGNOSIS — M62838 Other muscle spasm: Secondary | ICD-10-CM | POA: Diagnosis not present

## 2018-04-25 DIAGNOSIS — F329 Major depressive disorder, single episode, unspecified: Secondary | ICD-10-CM | POA: Diagnosis not present

## 2018-04-25 DIAGNOSIS — M5032 Other cervical disc degeneration, mid-cervical region, unspecified level: Secondary | ICD-10-CM | POA: Diagnosis not present

## 2018-04-25 DIAGNOSIS — Z462 Encounter for fitting and adjustment of other devices related to nervous system and special senses: Secondary | ICD-10-CM | POA: Diagnosis not present

## 2018-04-25 DIAGNOSIS — F419 Anxiety disorder, unspecified: Secondary | ICD-10-CM | POA: Diagnosis not present

## 2018-04-25 DIAGNOSIS — Z79899 Other long term (current) drug therapy: Secondary | ICD-10-CM | POA: Insufficient documentation

## 2018-04-25 DIAGNOSIS — M9901 Segmental and somatic dysfunction of cervical region: Secondary | ICD-10-CM | POA: Diagnosis not present

## 2018-04-25 DIAGNOSIS — M9903 Segmental and somatic dysfunction of lumbar region: Secondary | ICD-10-CM | POA: Diagnosis not present

## 2018-04-25 HISTORY — PX: INTRATHECAL PUMP REVISION: SHX6810

## 2018-04-25 LAB — GLUCOSE, CAPILLARY
Glucose-Capillary: 143 mg/dL — ABNORMAL HIGH (ref 70–99)
Glucose-Capillary: 127 mg/dL — ABNORMAL HIGH (ref 70–99)

## 2018-04-25 SURGERY — INTRATHECAL PUMP REVISION
Anesthesia: General | Site: Abdomen

## 2018-04-25 MED ORDER — DEXAMETHASONE SODIUM PHOSPHATE 10 MG/ML IJ SOLN
INTRAMUSCULAR | Status: DC | PRN
  Administered 2018-04-25: 5 mg via INTRAVENOUS

## 2018-04-25 MED ORDER — LIDOCAINE 2% (20 MG/ML) 5 ML SYRINGE
INTRAMUSCULAR | Status: AC
  Filled 2018-04-25: qty 5

## 2018-04-25 MED ORDER — 0.9 % SODIUM CHLORIDE (POUR BTL) OPTIME
TOPICAL | Status: DC | PRN
  Administered 2018-04-25 (×2): 1000 mL

## 2018-04-25 MED ORDER — CHLORHEXIDINE GLUCONATE CLOTH 2 % EX PADS: 6.0000 | MEDICATED_PAD | Freq: Once | CUTANEOUS | Status: DC

## 2018-04-25 MED ORDER — PHENYLEPHRINE 40 MCG/ML (10ML) SYRINGE FOR IV PUSH (FOR BLOOD PRESSURE SUPPORT)
PREFILLED_SYRINGE | INTRAVENOUS | Status: AC
  Filled 2018-04-25: qty 10

## 2018-04-25 MED ORDER — FENTANYL CITRATE (PF) 100 MCG/2ML IJ SOLN: 25.0000 ug | INTRAMUSCULAR | Status: DC | PRN

## 2018-04-25 MED ORDER — EPHEDRINE SULFATE-NACL 50-0.9 MG/10ML-% IV SOSY
PREFILLED_SYRINGE | INTRAVENOUS | Status: DC | PRN
  Administered 2018-04-25 (×3): 5 mg via INTRAVENOUS
  Administered 2018-04-25: 10 mg via INTRAVENOUS

## 2018-04-25 MED ORDER — OXYCODONE HCL 5 MG PO TABS: 5.0000 mg | ORAL_TABLET | ORAL | 0 refills | Status: DC | PRN

## 2018-04-25 MED ORDER — MIDAZOLAM HCL 5 MG/5ML IJ SOLN
INTRAMUSCULAR | Status: DC | PRN
  Administered 2018-04-25: 2 mg via INTRAVENOUS

## 2018-04-25 MED ORDER — SODIUM CHLORIDE 0.9 % IV SOLN
INTRAVENOUS | Status: DC | PRN
  Administered 2018-04-25: 500 mL

## 2018-04-25 MED ORDER — CEFAZOLIN SODIUM-DEXTROSE 2-4 GM/100ML-% IV SOLN
2.0000 g | INTRAVENOUS | Status: AC
  Administered 2018-04-25: 2 g via INTRAVENOUS
  Filled 2018-04-25: qty 100

## 2018-04-25 MED ORDER — DEXAMETHASONE SODIUM PHOSPHATE 10 MG/ML IJ SOLN
INTRAMUSCULAR | Status: AC
  Filled 2018-04-25: qty 1

## 2018-04-25 MED ORDER — ONDANSETRON HCL 4 MG/2ML IJ SOLN
INTRAMUSCULAR | Status: AC
  Filled 2018-04-25: qty 2

## 2018-04-25 MED ORDER — PROMETHAZINE HCL 25 MG/ML IJ SOLN: 6.2500 mg | INTRAMUSCULAR | Status: DC | PRN

## 2018-04-25 MED ORDER — FENTANYL CITRATE (PF) 100 MCG/2ML IJ SOLN
INTRAMUSCULAR | Status: DC | PRN
  Administered 2018-04-25 (×3): 50 ug via INTRAVENOUS

## 2018-04-25 MED ORDER — PROPOFOL 10 MG/ML IV BOLUS
INTRAVENOUS | Status: DC | PRN
  Administered 2018-04-25: 200 mg via INTRAVENOUS

## 2018-04-25 MED ORDER — LIDOCAINE 2% (20 MG/ML) 5 ML SYRINGE
INTRAMUSCULAR | Status: DC | PRN
  Administered 2018-04-25: 60 mg via INTRAVENOUS

## 2018-04-25 MED ORDER — PHENYLEPHRINE 40 MCG/ML (10ML) SYRINGE FOR IV PUSH (FOR BLOOD PRESSURE SUPPORT)
PREFILLED_SYRINGE | INTRAVENOUS | Status: DC | PRN
  Administered 2018-04-25: 40 ug via INTRAVENOUS

## 2018-04-25 MED ORDER — FENTANYL CITRATE (PF) 250 MCG/5ML IJ SOLN
INTRAMUSCULAR | Status: AC
  Filled 2018-04-25: qty 5

## 2018-04-25 MED ORDER — LACTATED RINGERS IV SOLN
INTRAVENOUS | Status: DC
  Administered 2018-04-25: 08:00:00 via INTRAVENOUS

## 2018-04-25 MED ORDER — BUPIVACAINE-EPINEPHRINE 0.5% -1:200000 IJ SOLN
INTRAMUSCULAR | Status: DC | PRN
  Administered 2018-04-25: 20 mL

## 2018-04-25 MED ORDER — ONDANSETRON HCL 4 MG/2ML IJ SOLN
INTRAMUSCULAR | Status: DC | PRN
  Administered 2018-04-25: 4 mg via INTRAVENOUS

## 2018-04-25 MED ORDER — EPHEDRINE 5 MG/ML INJ
INTRAVENOUS | Status: AC
  Filled 2018-04-25: qty 10

## 2018-04-25 MED ORDER — HOME MED STORE IN PYXIS
Status: DC | PRN
  Administered 2018-04-25: 1 via INTRATHECAL

## 2018-04-25 MED ORDER — DEXMEDETOMIDINE HCL 200 MCG/2ML IV SOLN
INTRAVENOUS | Status: DC | PRN
  Administered 2018-04-25: 4 ug via INTRAVENOUS
  Administered 2018-04-25: 8 ug via INTRAVENOUS
  Administered 2018-04-25: 4 ug via INTRAVENOUS

## 2018-04-25 MED ORDER — BUPIVACAINE-EPINEPHRINE 0.5% -1:200000 IJ SOLN
INTRAMUSCULAR | Status: AC
  Filled 2018-04-25: qty 1

## 2018-04-25 MED ORDER — MIDAZOLAM HCL 2 MG/2ML IJ SOLN
INTRAMUSCULAR | Status: AC
  Filled 2018-04-25: qty 2

## 2018-04-25 MED ORDER — KETOROLAC TROMETHAMINE 30 MG/ML IJ SOLN: 30.0000 mg | Freq: Once | INTRAMUSCULAR | Status: DC | PRN

## 2018-04-25 SURGICAL SUPPLY — 65 items
ADH SKN CLS APL DERMABOND .7 (GAUZE/BANDAGES/DRESSINGS) ×1
BAG DECANTER FOR FLEXI CONT (MISCELLANEOUS) ×2 IMPLANT
BINDER ABDOMINAL 12 ML 46-62 (SOFTGOODS) ×2 IMPLANT
BLADE CLIPPER SURG (BLADE) IMPLANT
BOOT SUTURE AID YELLOW STND (SUTURE) ×1 IMPLANT
CHLORAPREP W/TINT 26ML (MISCELLANEOUS) ×2 IMPLANT
COVER WAND RF STERILE (DRAPES) ×2 IMPLANT
DERMABOND ADVANCED (GAUZE/BANDAGES/DRESSINGS) ×1
DERMABOND ADVANCED .7 DNX12 (GAUZE/BANDAGES/DRESSINGS) IMPLANT
DRAPE C-ARMOR (DRAPES) ×1 IMPLANT
DRAPE CAMERA VIDEO/LASER (DRAPES) ×1 IMPLANT
DRAPE INCISE IOBAN 66X45 STRL (DRAPES) IMPLANT
DRAPE LAPAROTOMY T 102X78X121 (DRAPES) ×1 IMPLANT
DRAPE ORTHO SPLIT 77X108 STRL (DRAPES)
DRAPE SURG 17X23 STRL (DRAPES) ×4 IMPLANT
DRAPE SURG ORHT 6 SPLT 77X108 (DRAPES) ×2 IMPLANT
DRSG COVADERM 4X8 (GAUZE/BANDAGES/DRESSINGS) ×2 IMPLANT
ELECT REM PT RETURN 9FT ADLT (ELECTROSURGICAL) ×2
ELECTRODE REM PT RTRN 9FT ADLT (ELECTROSURGICAL) ×1 IMPLANT
ENVELOPE ABSORB ANTIBACTERIAL (Neuro Prosthesis/Implant) ×2 IMPLANT
GAUZE 4X4 16PLY RFD (DISPOSABLE) IMPLANT
GLOVE BIO SURGEON STRL SZ7.5 (GLOVE) ×2 IMPLANT
GLOVE BIOGEL PI IND STRL 6.5 (GLOVE) IMPLANT
GLOVE BIOGEL PI IND STRL 7.5 (GLOVE) ×1 IMPLANT
GLOVE BIOGEL PI INDICATOR 6.5 (GLOVE) ×2
GLOVE BIOGEL PI INDICATOR 7.5 (GLOVE) ×3
GLOVE ECLIPSE 7.5 STRL STRAW (GLOVE) ×2 IMPLANT
GLOVE SS BIOGEL STRL SZ 6.5 (GLOVE) IMPLANT
GLOVE SUPERSENSE BIOGEL SZ 6.5 (GLOVE) ×3
GOWN STRL REUS W/ TWL LRG LVL3 (GOWN DISPOSABLE) IMPLANT
GOWN STRL REUS W/ TWL XL LVL3 (GOWN DISPOSABLE) IMPLANT
GOWN STRL REUS W/TWL 2XL LVL3 (GOWN DISPOSABLE) IMPLANT
GOWN STRL REUS W/TWL LRG LVL3 (GOWN DISPOSABLE) ×6
GOWN STRL REUS W/TWL XL LVL3 (GOWN DISPOSABLE) ×4
KIT BASIN OR (CUSTOM PROCEDURE TRAY) ×3 IMPLANT
KIT REFILL (MISCELLANEOUS) ×1
KIT REFILL CATH SYNCHROMED II (MISCELLANEOUS) IMPLANT
KIT REVIS PUMP CNNECTR SUTLESS IMPLANT
KIT TURNOVER KIT B (KITS) ×2 IMPLANT
NDL HYPO 18GX1.5 BLUNT FILL (NEEDLE) ×1 IMPLANT
NDL HYPO 25X1 1.5 SAFETY (NEEDLE) ×1 IMPLANT
NEEDLE HYPO 18GX1.5 BLUNT FILL (NEEDLE) IMPLANT
NEEDLE HYPO 25X1 1.5 SAFETY (NEEDLE) ×2 IMPLANT
NS IRRIG 1000ML POUR BTL (IV SOLUTION) ×2 IMPLANT
PACK LAMINECTOMY NEURO (CUSTOM PROCEDURE TRAY) ×2 IMPLANT
PAD ARMBOARD 7.5X6 YLW CONV (MISCELLANEOUS) ×4 IMPLANT
POUCH TYRX ANTIBAC NEURO MED (Neuro Prosthesis/Implant) IMPLANT
PUMP CONNECTOR REV KIT SUTLESS ×2 IMPLANT
PUMP SYNCHROMED II 40ML RESVR (Neuro Prosthesis/Implant) ×1 IMPLANT
SPONGE LAP 4X18 RFD (DISPOSABLE) IMPLANT
SPONGE SURGIFOAM ABS GEL SZ50 (HEMOSTASIS) IMPLANT
STAPLER SKIN PROX WIDE 3.9 (STAPLE) ×2 IMPLANT
SUT MNCRL AB 4-0 PS2 18 (SUTURE) ×1 IMPLANT
SUT SILK 0 (SUTURE) ×2
SUT SILK 0 MO-6 18XCR BRD 8 (SUTURE) ×1 IMPLANT
SUT SILK 0 TIES 10X30 (SUTURE) IMPLANT
SUT SILK 2 0 TIES 10X30 (SUTURE) IMPLANT
SUT VIC AB 2-0 CP2 18 (SUTURE) ×4 IMPLANT
SYR 10ML LL (SYRINGE) IMPLANT
SYR 20CC LL (SYRINGE) ×2 IMPLANT
SYR 3ML LL SCALE MARK (SYRINGE) ×2 IMPLANT
TOWEL GREEN STERILE (TOWEL DISPOSABLE) ×2 IMPLANT
TOWEL GREEN STERILE FF (TOWEL DISPOSABLE) ×2 IMPLANT
WATER STERILE IRR 1000ML POUR (IV SOLUTION) ×2 IMPLANT
YANKAUER SUCT BULB TIP NO VENT (SUCTIONS) ×2 IMPLANT

## 2018-04-25 NOTE — H&P (Signed)
Melinda [name] is an 62 y.o. female.    Chief Complaint: "my pump battery needs to be replaced" HPI: Very pleasant 62 year old right-handed woman, referred by Dr. Consuela [name] for replacement of her intrathecal pump.  Melinda [name] has a longstanding history of lumbar and cervical degenerative disc disease for which she has undergone numerous surgeries.  She underwent lumbar diskectomy in [place] in 1979, and that in [place] underwent 2 further surgeries with my partner Melinda [name], that sound like diskectomies.  I actually do not have those records.  She has been the care of Melinda [name] for many years.  It sounds like she underwent injections and other medication trials before pump implantation around 2006. She has had the pump replaced once, due to battery life time.  She has had no complications with the replacement.  She has transition from a admixture of morphine, baclofen, and bupivacaine to fentanyl, baclofen, and bupivacaine a couple of years ago.  It sounds like her infusion parameters have been fairly stable.  She last underwent refill on 02/27/2018, and her estimated next refill alarm date is 07/05/2018.  Current concentrations of drug are fentanyl at 500 mcg/ml, baclofen at 300 mcg/ml, and bupivacaine 20 milligrams/milliliter. The primary drug determining her infusion rate is her fentanyl, which is currently running at 147.95 mcgg per day (6.16 mcg per hour).  As I compare her lumbar x-rays from most recent approximately 5 years prior, she has had advancement or progression of her scoliosis.  She reports some new symptoms in the last 6 months wherein when she standing, or walking she feels some amount of increased "pressure" in the back for legs that emanates to about the middle part of her thighs about the level of her knee.  She has an MRI pending 04/01/2018.  She also notes in the last 6 months or so she has felt like "knot" what she thinks the catheter incision side is.  She wonders whether the  catheter itself, or the anchor for it has become more superficial.   Past Medical History:  Diagnosis Date  . Amputation of great toe, right, traumatic ([place]) 05/30/2010  . Amputation of second toe, right, traumatic ([place]) 09/2017  . Anemia    years ago  . Anxiety   . Blue toes    2nd toe on right foot, will get appt.  . Bulging disc   . CAD (coronary artery disease) 2009   s/p stent to LAD  . 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  Ablations done.  Now has rare episodes  . Chicken pox   . Chronic fatigue   . Chronic kidney disease    "problem with kidney filtration"  . Coronary arteritis   . Degenerative disc disease   . Depression   . Diabetes mellitus without complication ([place])   . Facet joint disease   . Fibromyalgia   . Heart disease   . Hyperlipidemia   . IBS (irritable bowel syndrome)   . MCL deficiency, knee   . MRSA (methicillin resistant staph aureus) culture positive 2011   GREAT TOE RIGHT FOOT  . Neuropathy 04/18/2010  . Peripheral neuropathy   . Restless leg syndrome   . Sepsis ([place]) 09/2013  . Sleep apnea 08/17/2003   uses CPAP, sleep study at [facility] (mild to moderate)  . Spinal stenosis     Past Surgical History:  Procedure Laterality Date  . ABLATION     UTERUS  . ABLATION  HEART  . AMPUTATION TOE Right 02/01/2017   Procedure: AMPUTATION TOE-RIGHT 2ND MPJ;  Surgeon: Albertine Patricia, DPM;  Location: [facility] ORS;  Service: Podiatry;  Laterality: Right;  . ANTERIOR CERVICAL DECOMP/DISCECTOMY FUSION N/A 07/28/2014   Procedure: ANTERIOR CERVICAL DECOMPRESSION/DISCECTOMY FUSION CERVICAL 3-4,4-5,5-6 LEVELS WITH INSTRUMENTATION AND ALLOGRAFT;  Surgeon: Sinclair Ship, MD;  Location: [place];  Service: Orthopedics;  Laterality: N/A;  Anterior cervical decompression fusion, cervical 3-4, cervical 4-5, cervical 5-6 with instrumentation and allograft  . BACK SURGERY     X 3 1979, 1994, 1995  . CARPAL TUNNEL RELEASE Right   .  CHOLECYSTECTOMY  2003  . CORONARY ANGIOPLASTY    . EYE SURGERY Bilateral 2013   Eyelid lift   . FOOT SURGERY Right    BIG TOE  . FOOT SURGERY Bilateral    PLANTAR FASCIITIS  . FOOT SURGERY Right    2ND TOE  . GALLBLADDER SURGERY    . HAMMER TOE SURGERY Right 10/17/2017   Procedure: HAMMER TOE CORRECTION-4TH TOE;  Surgeon: Albertine Patricia, DPM;  Location: [place];  Service: Podiatry;  Laterality: Right;  LMA- WITH LOCAL Diabetic - diet controlled  . HAND SURGERY Left   . HAND SURGEY Left   . HEART STENT  2009   LAD  . INFUSION PUMP IMPLANTATION     X2 with morphine and baclofen  . IRRIGATION AND DEBRIDEMENT FOOT Right 02/23/2017   Procedure: IRRIGATION AND DEBRIDEMENT FOOT-right foot;  Surgeon: Samara Deist, DPM;  Location: [facility] ORS;  Service: Podiatry;  Laterality: Right;  . TOE SURGERY     then revision 8/18    Family History  Problem Relation Age of Onset  . Lung cancer Mother   . Diabetes Father   . Heart disease Maternal Grandfather   . Diabetes Paternal Grandmother   . Colon cancer Paternal Grandfather   . Stroke Neg Hx    Social History:  reports that she quit smoking about 10 years ago. Her smoking use included cigarettes. She has a 48.00 pack-year smoking history. She has never used smokeless tobacco. She reports that she drinks alcohol. She reports that she does not use drugs.  Allergies: No Known Allergies  Medications Prior to Admission  Medication Sig Dispense Refill  . aspirin 81 MG tablet Take 81 mg by mouth daily.    Marland Kitchen buPROPion (WELLBUTRIN XL) 150 MG 24 hr tablet Take 1 tablet (150 mg total) by mouth daily. MUST SCHEDULE ANNUAL EXAM 90 tablet 3  . Cinnamon 500 MG TABS Take 1,000 mg by mouth daily.    . Coenzyme Q10 (CO Q 10) 100 MG CAPS Take 100 mg by mouth daily.     Marland Kitchen glipiZIDE (GLUCOTROL XL) 2.5 MG 24 hr tablet TAKE 1 TABLET BY MOUTH EVERY DAY WITH BREAKFAST (Patient taking differently: Take 2.5 mg by mouth daily with breakfast. ) 90 tablet  0  . glucosamine-chondroitin 500-400 MG tablet Take 2 tablets by mouth daily.     . Magnesium 250 MG TABS Take 250 mg by mouth daily.    . Misc Natural Products (TART CHERRY ADVANCED PO) Take 1,200 mg by mouth at bedtime.     . Multiple Vitamin (MULTIVITAMIN) tablet Take 1 tablet by mouth daily.    . naloxone (NARCAN) 2 MG/2ML injection Inject 1 mL (1 mg total) into the muscle as needed (for opioid overdose). Inject content of syringe into thigh muscle. Call 911. 2 Syringe 1  . nitroGLYCERIN (NITROSTAT) 0.4 MG SL tablet Place 1 tablet (0.4  mg total) under the tongue every 5 (five) minutes as needed for chest pain. 25 tablet prn  . NONFORMULARY OR COMPOUNDED ITEM 147.95 mcg by Epidural Infusion route Continuous EPIDURAL. Medtronic Neuromodulation pump Fentanyl 147.95 mcg, Baclofen, bupivicaine    . NONFORMULARY OR COMPOUNDED ITEM 88.77 mcg by Epidural Infusion route Continuous EPIDURAL. Medtronic Neuromodulation pump: Fentanyl, baclofen 88.77, bupivicane    . NONFORMULARY OR COMPOUNDED ITEM 5.918 mg by Epidural Infusion route Continuous EPIDURAL. Medtronic Neuromodulation pump: Fentanyl, Baclofen, Bupivicaine 5.918 mg    . Omega-3 Fatty Acids (FISH OIL PO) Take 1,600 mg by mouth 2 (two) times daily.     Marland Kitchen oxyCODONE (OXY IR/ROXICODONE) 5 MG immediate release tablet Take 1 tablet (5 mg total) by mouth every 8 (eight) hours as needed for severe pain. 90 tablet 0  . PARoxetine (PAXIL) 20 MG tablet TAKE ONE-HALF OF A TABLET BY MOUTH DAILY IN THE MORNING (Patient taking differently: Take 10 mg by mouth daily. TAKE ONE-HALF OF A TABLET BY MOUTH DAILY IN THE MORNING) 90 tablet 0  . polyethylene glycol (MIRALAX / GLYCOLAX) packet Take 17 g by mouth daily.     . pregabalin (LYRICA) 100 MG capsule Take 1 capsule (100 mg total) by mouth 3 (three) times daily. 360 capsule 0  . rosuvastatin (CRESTOR) 20 MG tablet Take 1 tablet (20 mg total) by mouth daily. 30 tablet 11  . tiZANidine (ZANAFLEX) 4 MG tablet Take 1  tablet (4 mg total) by mouth 3 (three) times daily. 90 tablet 3  . TURMERIC PO Take 1 tablet by mouth 2 (two) times daily.     . Vitamin D, Ergocalciferol, (DRISDOL) 50000 units CAPS capsule Take 1 capsule (50,000 Units total) by mouth every 7 (seven) days. 12 capsule 0  . vitamin E 400 UNIT capsule Take 400 Units by mouth daily.    Marland Kitchen ACCU-CHEK AVIVA PLUS test strip TEST 1 TIME DAILY **E11.9** 200 each 2  . ACCU-CHEK SOFTCLIX LANCETS lancets Use to check blood sugar 1 time daily.  Dx: E11.42 100 each 2  . AMBULATORY NON FORMULARY MEDICATION Medication Name: CPAP MASK OF CHOICE FOR HOME DEVICE 1 each 0  . oxyCODONE (OXY IR/ROXICODONE) 5 MG immediate release tablet Take 1 tablet (5 mg total) by mouth every 8 (eight) hours as needed for severe pain. (Patient not taking: Reported on 04/10/2018) 90 tablet 0  . oxyCODONE (OXY IR/ROXICODONE) 5 MG immediate release tablet Take 1 tablet (5 mg total) by mouth every 8 (eight) hours as needed for severe pain. (Patient not taking: Reported on 04/10/2018) 90 tablet 0    Results for orders placed or performed during the hospital encounter of 04/25/18 (from the past 48 hour(s))  Glucose, capillary     Status: Abnormal   Collection Time: 04/25/18  7:24 AM  Result Value Ref Range   Glucose-Capillary 143 (H) 70 - 99 mg/dL   Comment 1 Notify RN    Comment 2 Document in Chart    No results found.  Review of Systems  Constitutional: Negative.   HENT: Negative.   Eyes: Negative.   Respiratory: Negative.   Cardiovascular: Negative.   Gastrointestinal: Negative.   Genitourinary: Negative.   Musculoskeletal: Negative.   Skin: Negative.   Neurological: Negative.   Endo/Heme/Allergies: Negative.   Psychiatric/Behavioral: Negative.     Blood pressure 118/69, pulse 66, temperature 98.4 F (36.9 C), temperature source Oral, resp. rate 20, height 5\' 10"  (1.778 m), weight 103.5 kg, SpO2 94 %. Physical Exam  Constitutional: She is  oriented to person, place, and  time. She appears well-developed and well-nourished.  HENT:  Head: Normocephalic and atraumatic.  Eyes: Pupils are equal, round, and reactive to light. EOM are normal.  Neck: Normal range of motion.  Cardiovascular: Normal rate.  Respiratory: Effort normal.  Musculoskeletal: Normal range of motion.  Neurological: She is alert and oriented to person, place, and time.  Skin: Skin is warm and dry.  Psychiatric: She has a normal mood and affect. Her behavior is normal. Thought content normal.     Assessment/Plan IT pump at end of battery life Spinal stenosis  PLAN: replace pump; have reviewed recent updated imaging and pt can consider options with Dr. Consuela [name] for conservative care vs surgical options with complex spine surgeon.  Bonna Gains, MD 04/25/2018, 10:15 AM

## 2018-04-25 NOTE — Anesthesia Preprocedure Evaluation (Signed)
Anesthesia Evaluation  Patient identified by MRN, date of birth, ID band Patient awake    Reviewed: Allergy & Precautions, NPO status , Patient's Chart, lab work & pertinent test results  Airway Mallampati: II  TM Distance: >3 FB Neck ROM: Full    Dental no notable dental hx.    Pulmonary sleep apnea and Continuous Positive Airway Pressure Ventilation , former smoker,    Pulmonary exam normal breath sounds clear to auscultation       Cardiovascular hypertension, + CAD and + Cardiac Stents  Normal cardiovascular exam Rhythm:Regular Rate:Normal  H/O WPW s/p ablation   Neuro/Psych negative neurological ROS  negative psych ROS   GI/Hepatic negative GI ROS, Neg liver ROS,   Endo/Other  negative endocrine ROSdiabetes  Renal/GU negative Renal ROS  negative genitourinary   Musculoskeletal negative musculoskeletal ROS (+)   Abdominal   Peds negative pediatric ROS (+)  Hematology negative hematology ROS (+)   Anesthesia Other Findings   Reproductive/Obstetrics negative OB ROS                             Anesthesia Physical Anesthesia Plan  ASA: III  Anesthesia Plan: General   Post-op Pain Management:    Induction: Intravenous  PONV Risk Score and Plan: 3 and Ondansetron, Dexamethasone and Treatment may vary due to age or medical condition  Airway Management Planned: Oral ETT  Additional Equipment:   Intra-op Plan:   Post-operative Plan: Extubation in OR  Informed Consent: I have reviewed the patients History and Physical, chart, labs and discussed the procedure including the risks, benefits and alternatives for the proposed anesthesia with the patient or authorized representative who has indicated his/her understanding and acceptance.   Dental advisory given  Plan Discussed with: CRNA and Surgeon  Anesthesia Plan Comments:         Anesthesia Quick Evaluation

## 2018-04-25 NOTE — Discharge Instructions (Addendum)

## 2018-04-25 NOTE — Op Note (Signed)
PREOP DX: chronic pain treated with intrathecal drug therapy; IT pump at end of battery life POSTOP DX: same PROCEDURE: replacement of IT pump SURGEON: Noemy Hallmon ASSISTANT: none ANESTHESIA: General, LMA  FINDINGS: intact catheter, dacron pouch EBL: minimal DESCRIPTION OF PROCEDURE: Patient was brought to the operating room and anesthesia induced by the anesthesia team. Patient was positioned, padded. Left lower abdominal quadrant was prepped with chloraprep scrub. Timeout taken.   Area of planned incision was infiltrated with marcaine. Prior incision was reopened and the old pain pump was externalized. Dacron pouch was removed. Catheter was disconnected. There was no spontaneous flow of CSF. We therefore did a pump prime on the back table. The new pain pump was filled with the same components--fentanyl, baclofen and bupivicaine--and connected to the catheter. 2-0 silk sutures were placed in the pocket. The new pump was then internalized with the tip in the 12 o'clock position, care taken to secure the pump without incorporating the catheter. Wound was irrigated , and a antibiotic eluting patch placed in the pocket. Incision was closed with 2-0 Vicryl and 3-0 monocryl and dermabond and dressed with a sterile occlusive dressing. Pump was programmed to run a simple continuous infusion with fentanyl as the driving drug, at 741 mcg/day.   COMPLICATIONS: none CONDITION: stable during procedure, and to PACU DISPOSITION: home with additional pain medication. F/u 2 weeks for wound check. New reservoir alarm date is in March of 2020. The infusion rate is approximately 10% reduced from pre-replacement as a safety factor, but she has f/u scheduled with Dr. Adalberto Cole practice in about 10 days and they can adjust as desired.

## 2018-04-25 NOTE — Anesthesia Procedure Notes (Signed)
Procedure Name: LMA Insertion Date/Time: 04/25/2018 10:28 AM Performed by: Gwyndolyn Saxon, CRNA Pre-anesthesia Checklist: Patient identified, Emergency Drugs available, Suction available and Patient being monitored Patient Re-evaluated:Patient Re-evaluated prior to induction Oxygen Delivery Method: Circle system utilized Preoxygenation: Pre-oxygenation with 100% oxygen Induction Type: IV induction Ventilation: Mask ventilation without difficulty LMA: LMA inserted LMA Size: 4.0 Number of attempts: 1 Airway Equipment and Method: Patient positioned with wedge pillow Placement Confirmation: positive ETCO2 and breath sounds checked- equal and bilateral Tube secured with: Tape Dental Injury: Teeth and Oropharynx as per pre-operative assessment

## 2018-04-25 NOTE — Anesthesia Postprocedure Evaluation (Signed)
Anesthesia Post Note  Patient: [name]  Procedure(s) Performed: Intrathecal pump replacement (N/A Abdomen)     Patient location during evaluation: PACU Anesthesia Type: General Level of consciousness: awake and alert Pain management: pain level controlled Vital Signs Assessment: post-procedure vital signs reviewed and stable Respiratory status: spontaneous breathing, nonlabored ventilation, respiratory function stable and patient connected to nasal cannula oxygen Cardiovascular status: blood pressure returned to baseline and stable Postop Assessment: no apparent nausea or vomiting Anesthetic complications: no    Last Vitals:  Vitals:   04/25/18 1225 04/25/18 1300  BP: (!) 143/90 (!) 161/80  Pulse:  80  Resp: 15 16  Temp: (!) 36.3 C   SpO2: 100% 97%    Last Pain:  Vitals:   04/25/18 1300  TempSrc:   PainSc: 0-No pain                 Geeta Dworkin S

## 2018-04-25 NOTE — Transfer of Care (Signed)
Immediate Anesthesia Transfer of Care Note  Patient: Melinda [name]  Procedure(s) Performed: Intrathecal pump replacement (N/A Abdomen)  Patient Location: PACU  Anesthesia Type:General  Level of Consciousness: awake and patient cooperative  Airway & Oxygen Therapy: Patient Spontanous Breathing and Patient connected to face mask oxygen  Post-op Assessment: Report given to RN and Post -op Vital signs reviewed and stable  Post vital signs: Reviewed and stable  Last Vitals:  Vitals Value Taken Time  BP 143/90 04/25/2018 12:25 PM  Temp 36.3 C 04/25/2018 12:25 PM  Pulse 85 04/25/2018 12:27 PM  Resp 20 04/25/2018 12:27 PM  SpO2 98 % 04/25/2018 12:27 PM  Vitals shown include unvalidated device data.  Last Pain:  Vitals:   04/25/18 1225  TempSrc:   PainSc: 0-No pain      Patients Stated Pain Goal: 3 (93/81/01 7510)  Complications: No apparent anesthesia complications

## 2018-04-28 ENCOUNTER — Encounter (HOSPITAL_COMMUNITY): Payer: Self-pay | Admitting: Anesthesiology

## 2018-04-30 ENCOUNTER — Ambulatory Visit: Payer: Medicare Other | Attending: Nurse Practitioner | Admitting: Nurse Practitioner

## 2018-04-30 ENCOUNTER — Encounter: Payer: Self-pay | Admitting: Nurse Practitioner

## 2018-04-30 VITALS — BP 113/59 | HR 77 | Temp 98.3°F | Resp 16 | Ht 70.0 in | Wt 225.0 lb

## 2018-04-30 DIAGNOSIS — Z9861 Coronary angioplasty status: Secondary | ICD-10-CM | POA: Diagnosis not present

## 2018-04-30 DIAGNOSIS — Z9049 Acquired absence of other specified parts of digestive tract: Secondary | ICD-10-CM | POA: Insufficient documentation

## 2018-04-30 DIAGNOSIS — Z981 Arthrodesis status: Secondary | ICD-10-CM | POA: Insufficient documentation

## 2018-04-30 DIAGNOSIS — Z451 Encounter for adjustment and management of infusion pump: Secondary | ICD-10-CM | POA: Insufficient documentation

## 2018-04-30 DIAGNOSIS — Z89421 Acquired absence of other right toe(s): Secondary | ICD-10-CM | POA: Insufficient documentation

## 2018-04-30 DIAGNOSIS — Z7982 Long term (current) use of aspirin: Secondary | ICD-10-CM | POA: Diagnosis not present

## 2018-04-30 DIAGNOSIS — M797 Fibromyalgia: Secondary | ICD-10-CM | POA: Insufficient documentation

## 2018-04-30 DIAGNOSIS — M47816 Spondylosis without myelopathy or radiculopathy, lumbar region: Secondary | ICD-10-CM | POA: Insufficient documentation

## 2018-04-30 DIAGNOSIS — Z79891 Long term (current) use of opiate analgesic: Secondary | ICD-10-CM | POA: Insufficient documentation

## 2018-04-30 DIAGNOSIS — M48062 Spinal stenosis, lumbar region with neurogenic claudication: Secondary | ICD-10-CM | POA: Diagnosis not present

## 2018-04-30 DIAGNOSIS — Z79899 Other long term (current) drug therapy: Secondary | ICD-10-CM | POA: Diagnosis not present

## 2018-04-30 DIAGNOSIS — G894 Chronic pain syndrome: Secondary | ICD-10-CM | POA: Diagnosis not present

## 2018-04-30 MED ORDER — OXYCODONE HCL 5 MG PO TABS: 5.0000 mg | ORAL_TABLET | Freq: Three times a day (TID) | ORAL | 0 refills | Status: DC | PRN

## 2018-04-30 MED ORDER — TIZANIDINE HCL 4 MG PO TABS: 4.0000 mg | ORAL_TABLET | Freq: Three times a day (TID) | ORAL | 2 refills | Status: DC

## 2018-04-30 MED ORDER — PREGABALIN 100 MG PO CAPS: 100.0000 mg | ORAL_CAPSULE | Freq: Three times a day (TID) | ORAL | 0 refills | Status: DC

## 2018-04-30 NOTE — Progress Notes (Signed)
Patient's Name: Melinda [name]  MRN: [id number]  Referring Provider: Jearld Fenton, NP  DOB: 1956/03/22  PCP: Jearld Fenton, NP  DOS: 04/30/2018  Note by: Dionisio David, NP  Service setting: Ambulatory outpatient  Specialty: Interventional Pain Management  Patient type: Established  Location: [facility] (AMB) Pain Management Facility  Visit type: Interventional Procedure   Primary Reason for Visit: Interventional Pain Management Treatment. CC: Back Pain (lumbar bilateral )  Procedure:  Intrathecal Drug Delivery System (IDDS):  Type: Reservoir Refill [phone])       Region: Abdominal Laterality: Right  Type of Pump: Medtronic Synchromed II (MRI-compatible) Delivery Route: Intrathecal Type of Pain Treated: Neuropathic/Nociceptive Primary Medication Class: Opioid/opiate  Medication, Concentration, Infusion Program, & Delivery Rate: Please see scanned programming printout.  Indications: 1. Spinal stenosis, lumbar region with neurogenic claudication   2. Lumbar spondylosis   3. Fibromyalgia   4. Encounter for adjustment or management of infusion pump   5. Chronic pain syndrome    Pain Assessment: Self-Reported Pain Score: 4 /10             Reported level is compatible with observation.        Intrathecal Pump Therapy Assessment  Manufacturer: Medtronic Synchromed II Type: Programmable Volume: 40 mL reservoir MRI compatibility: Yes   Drug content:  Primary Medication Class: Opioid Primary Medication: PF-Fentanyl Secondary Medication: see pump readout Other Medication: see pump readout   Programming:  Type: Simple continuous. See pump readout for details.   Changes:  Medication Change: None at this point Rate Change: No change in rate  Reported side-effects or adverse reactions: None reported  Effectiveness: Described as relatively effective, allowing for increase in activities of daily living (ADL) Clinically meaningful improvement in function (CMIF): Sustained CMIF goals  met  Plan: Pump refill today  Pre-op Assessment:  Melinda [name] is a 62 y.o. (year old), female patient, seen today for interventional treatment. 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 Melinda [name] has a current medication list which includes the following prescription(s): accu-chek aviva plus, accu-chek softclix lancets, AMBULATORY NON FORMULARY MEDICATION, aspirin, bupropion, cinnamon, co q 10, glipizide, glucosamine-chondroitin, magnesium, misc natural products, multivitamin, naloxone, nitroglycerin, NONFORMULARY OR COMPOUNDED ITEM, NONFORMULARY OR COMPOUNDED ITEM, NONFORMULARY OR COMPOUNDED ITEM, omega-3 fatty acids, oxycodone, paroxetine, polyethylene glycol, pregabalin, rosuvastatin, tizanidine, turmeric, vitamin d (ergocalciferol), vitamin e, oxycodone, and oxycodone. Her primarily concern today is the Back Pain (lumbar bilateral )  Initial Vital Signs:  Pulse/HCG Rate: 77  Temp: 98.3 F (36.8 C) Resp: 16 BP: (!) 113/59 SpO2: 95 %  BMI: Estimated body mass index is 32.28 kg/m as calculated from the following:   Height as of this encounter: _0  (1.778 m).   Weight as of this encounter: 225 lb (102.1 kg).  Risk Assessment: Allergies: Reviewed. She has No Known Allergies.  Allergy Precautions: None required Coagulopathies: Reviewed. None identified.  Blood-thinner therapy:  None at this time Active Infection(s): Reviewed. None identified. Melinda [name] is afebrile  Site Confirmation: [name] was asked to confirm the procedure and laterality before marking the site Procedure  checklist: Completed Consent: Before the procedure and under the influence of no sedative(s), amnesic(s), or anxiolytics, the patient was informed of the treatment options, risks and possible complications. To fulfill our ethical and legal obligations, as recommended by the American Medical Association's Code of Ethics, I have informed the patient of my clinical impression; the nature and purpose of the treatment or procedure; the risks, benefits, and possible complications of the intervention; the alternatives, including doing nothing; the risk(s) and benefit(s) of the alternative treatment(s) or procedure(s); and the risk(s) and benefit(s) of doing nothing.  Melinda [name] was provided with information about the general risks and possible complications associated with most interventional procedures. These include, but are not limited to: failure to achieve desired goals, infection, bleeding, organ or nerve damage, allergic reactions, paralysis, and/or death.  In addition, she was informed of those risks and possible complications associated to this particular procedure, which include, but are not limited to: damage to the implant; failure to decrease pain; local, systemic, or serious CNS infections, intraspinal abscess with possible cord compression and paralysis, or life-threatening such as meningitis; bleeding; organ damage; nerve injury or damage with subsequent sensory, motor, and/or autonomic system dysfunction, resulting in transient or permanent pain, numbness, and/or weakness of one or several areas of the body; allergic reactions, either minor or major life-threatening, such as anaphylactic or anaphylactoid reactions.  Furthermore, Melinda [name] was informed of those risks and complications  associated with the medications. These include, but are not limited to: allergic reactions (i.e.: anaphylactic or anaphylactoid reactions); endorphine suppression; bradycardia and/or hypotension; water retention and/or peripheral vascular relaxation leading to lower extremity edema and possible stasis ulcers; respiratory depression and/or shortness of breath; decreased metabolic rate leading to weight gain; swelling or edema; medication-induced neural toxicity; particulate matter embolism and blood vessel occlusion with resultant organ, and/or nervous system infarction; and/or intrathecal granuloma formation with possible spinal cord compression and permanent paralysis.  Before refilling the pump Ms. Slingerland was informed that some of the medications used in the devise may not be FDA approved for such use and therefore it constitutes an off-label use of the medications.  Finally, she was informed that Medicine is not an exact science; therefore, there is also the possibility of unforeseen or unpredictable risks and/or possible complications that may result in a catastrophic outcome. The patient indicated having understood very clearly. We have given the patient no guarantees and we have made no promises. Enough time was given to the patient to ask questions, all of which were answered to the patient's satisfaction. Ms. Umholtz has indicated that she wanted to continue with the procedure. Attestation: I, the ordering provider, attest that I have discussed with the patient the benefits, risks, side-effects, alternatives, likelihood of achieving goals, and potential problems during recovery for the procedure that I have provided informed consent. Date  Time: 04/30/2018 10:26 AM  Pre-Procedure Preparation:  Monitoring: As per clinic protocol. Respiration, ETCO2, SpO2, BP, heart rate and rhythm monitor placed and checked for adequate function Safety Precautions: Patient was assessed for positional comfort and  pressure points before starting the procedure. Time-out: I initiated and conducted the "Time-out" before starting the procedure, as per protocol. The patient was asked to participate by confirming the accuracy of the "Time Out" information. Verification of the correct person, site, and procedure were performed and confirmed by me, the nursing staff, and the patient. "Time-out" conducted as per Joint Commission's Universal Protocol (UP.01.01.01). Time:    Description of Procedure Process:   Position: Supine  Target Area: Central-port of intrathecal pump. Approach: Anterior, 90 degree angle approach. Area Prepped: Entire Area around the pump implant. Prepping solution: ChloraPrep (2% chlorhexidine gluconate and 70% isopropyl alcohol) Safety Precautions: Aspiration looking for blood return was conducted prior to all injections. At no point did we inject any substances, as a needle was being advanced. No attempts were made at seeking any paresthesias. Safe injection practices and needle disposal techniques used. Medications properly checked for expiration dates. SDV (single dose vial) medications used. Description of the Procedure: Protocol guidelines were followed. Two nurses trained to do implant refills were present during the entire procedure. The refill medication was checked by both healthcare providers as well as the patient. The patient was included in the "Time-out" to verify the medication. The patient was placed in position. The pump was identified. The area was prepped in the usual manner. The sterile template was positioned over the pump, making sure the side-port location matched that of the pump. Both, the pump and the template were held for stability. The needle provided in the Medtronic Kit was then introduced thru the center of the template and into the central port. The pump content was aspirated and discarded volume documented. The new medication was slowly infused into the pump, thru the  filter, making sure to avoid overpressure of the device. The needle was then removed and the area cleansed, making sure to leave some of the prepping solution back to take advantage of its long term bactericidal properties. The pump was interrogated and programmed to reflect the correct medication, volume, and dosage. The program was printed and taken to the physician for approval. Once checked and signed by the physician, a copy was provided to the patient and another scanned into the EMR. Vitals:   04/30/18 1024  BP: (!) 113/59  Pulse: 77  Resp: 16  Temp: 98.3 F (36.8 C)  TempSrc: Oral  SpO2: 95%  Weight: 225 lb (102.1 kg)  Height: _0  (1.778 m)    Start Time:   hrs. End Time:   hrs. Materials & Medications: Medtronic Refill Kit Medication(s): Please see chart orders for details.  Imaging Guidance:          Type of Imaging Technique: None used Indication(s): N/A Exposure Time: No patient exposure Contrast: None used. Fluoroscopic Guidance: N/A Ultrasound Guidance: N/A Interpretation: N/A  Antibiotic Prophylaxis:   Anti-infectives (From admission, onward)   None     Indication(s): None identified  Post-operative Assessment:  Post-procedure Vital Signs:  Pulse/HCG Rate: 77  Temp: 98.3 F (36.8 C) Resp: 16 BP: (!) 113/59 SpO2: 95 %  EBL: None  Complications: No immediate post-treatment complications observed by team, or reported by patient.  Note: The patient tolerated the entire procedure well. A repeat set of vitals were taken after the procedure and the patient was kept under observation following institutional policy, for this type of procedure. Post-procedural neurological assessment was performed, showing return to baseline, prior to discharge. The patient was provided with post-procedure discharge instructions, including a section on how to identify potential problems. Should any problems arise concerning this procedure, the patient was given instructions to  immediately contact us, at any time, without hesitation. In any case, we plan to contact the patient by telephone for a follow-up status report regarding this interventional procedure.  Comments:  No additional relevant information.  Controlled Substance Pharmacotherapy Assessment REMS (Risk Evaluation and Mitigation Strategy)  Analgesic:Oxycodone IR 5 mg every 8 hours (15 mg/day), in addition to the intrathecal  pump medication.  MME/day:22 mg/day of oral medication Janett Billow, RN  04/30/2018 10:38 AM  Sign at close encounter Nursing Pain Medication Assessment:  Safety precautions to be maintained throughout the outpatient stay will include: orient to surroundings, keep bed in low position, maintain call bell within reach at all times, provide assistance with transfer out of bed and ambulation.  Medication Inspection Compliance: Pill count conducted under aseptic conditions, in front of the patient. Neither the pills nor the bottle was removed from the patient's sight at any time. Once count was completed pills were immediately returned to the patient in their original bottle.  Medication: Oxycodone IR Pill/Patch Count: 21 of 90 pills remain Pill/Patch Appearance: Markings consistent with prescribed medication Bottle Appearance: Standard pharmacy container. Clearly labeled. Filled Date: 10 / 04 / 2019 Last Medication intake:  Today   Pharmacokinetics: Liberation and absorption (onset of action): WNL Distribution (time to peak effect): WNL Metabolism and excretion (duration of action): WNL         Pharmacodynamics: Desired effects: Analgesia: Ms. Simoneaux reports >50% benefit. Functional ability: Patient reports that medication allows her to accomplish basic ADLs Clinically meaningful improvement in function (CMIF): Sustained CMIF goals met Perceived effectiveness: Described as relatively effective, allowing for increase in activities of daily living (ADL) Undesirable  effects: Side-effects or Adverse reactions: None reported Monitoring: [place] PMP: Online review of the past 85-monthperiod conducted. Compliant with practice rules and regulations Last UDS on record: Summary  Date Value Ref Range Status  01/23/2018 FINAL  Final    Comment:    ==================================================================== TOXASSURE SELECT 13 (MW) ==================================================================== Test                             Result       Flag       Units Drug Present and Declared for Prescription Verification   Oxycodone                      1016         EXPECTED   ng/mg creat   Oxymorphone                    159          EXPECTED   ng/mg creat   Noroxycodone                   1697         EXPECTED   ng/mg creat    Sources of oxycodone include scheduled prescription medications.    Oxymorphone and noroxycodone are expected metabolites of    oxycodone. Oxymorphone is also available as a scheduled    prescription medication. Drug Present not Declared for Prescription Verification   Fentanyl                       8            UNEXPECTED ng/mg creat   Norfentanyl                    94           UNEXPECTED ng/mg creat    Source of fentanyl is a scheduled prescription medication,    including IV, patch, and transmucosal formulations. Norfentanyl    is an expected metabolite of fentanyl. ==================================================================== Test  Result    Flag   Units      Ref Range   Creatinine              150              mg/dL      >=20 ==================================================================== Declared Medications:  The flagging and interpretation on this report are based on the  following declared medications.  Unexpected results may arise from  inaccuracies in the declared medications.  **Note: The testing scope of this panel includes these medications:  Oxycodone  **Note: The testing scope of  this panel does not include following  reported medications:  Aspirin (Aspirin 81)  Bupropion  Cholecalciferol  Chondroitin (Glucosamine-Chondroitin)  Chromium  Cinnamon Bark  Glipizide  Glucosamine (Glucosamine-Chondroitin)  Magnesium  Multivitamin  Naloxone  Nitroglycerin  Paroxetine  Polyethylene Glycol  Pregabalin  Rosuvastatin  Supplement (Omega-3)  Tizanidine  Ubiquinone (Coenzyme Q 10)  Vitamin E ==================================================================== For clinical consultation, please call [phone]. ====================================================================    UDS interpretation: Compliant          Medication Assessment Form: Reviewed. Patient indicates being compliant with therapy Treatment compliance: Compliant Risk Assessment Profile: Aberrant behavior: See prior evaluations. None observed or detected today Comorbid factors increasing risk of overdose: See prior notes. No additional risks detected today Opioid risk tool (ORT) (Total Score): 4 Personal History of Substance Abuse (SUD-Substance use disorder):  Alcohol: Negative  Illegal Drugs: Negative  Rx Drugs: Negative  ORT Risk Level calculation: Moderate Risk Risk of substance use disorder (SUD): Low Opioid Risk Tool - 04/30/18 1140      Family History of Substance Abuse   Alcohol  Positive Female    Illegal Drugs  Negative    Rx Drugs  Negative      Personal History of Substance Abuse   Alcohol  Negative    Illegal Drugs  Negative    Rx Drugs  Negative      Age   Age between 51-45 years   No      History of Preadolescent Sexual Abuse   History of Preadolescent Sexual Abuse  Negative or Female      Psychological Disease   Psychological Disease  Positive    ADD  Negative    OCD  Negative    Bipolar  Negative    Schizophrenia  Negative    Depression  Positive   taking depression meds     Total Score   Opioid Risk Tool Scoring  4    Opioid Risk Interpretation   Moderate Risk      ORT Scoring interpretation table:  Score <3 = Low Risk for SUD  Score between 4-7 = Moderate Risk for SUD  Score >8 = High Risk for Opioid Abuse   Risk Mitigation Strategies:  Patient Counseling: Covered Patient-Prescriber Agreement (PPA): Present and active  Notification to other healthcare providers: Done  Pharmacologic Plan: No change in therapy, at this time.               She is having increased pain. She had a recent MRI that shows progression of her lumbar spinal stenosis. She would like treatment for this. She has not has any interventional therapy in 3 years. She admits that they were not effective in the past but she is willing to see if this may help her. She was told by [name] office that her scoliosis has progressed and they would refer her to [facility].  Assessment  Primary Diagnosis &  Pertinent Problem List: The primary encounter diagnosis was Spinal stenosis, lumbar region with neurogenic claudication. Diagnoses of Lumbar spondylosis, Fibromyalgia, Encounter for adjustment or management of infusion pump, and Chronic pain syndrome were also pertinent to this visit.     Status Diagnosis  Worsening Worsening Persistent 1. Spinal stenosis, lumbar region with neurogenic claudication   2. Lumbar spondylosis   3. Fibromyalgia   4. Encounter for adjustment or management of infusion pump   5. Chronic pain syndrome     Problems updated and reviewed during this visit: No problems updated. Plan of Care   Imaging Orders  No imaging studies ordered today    Procedure Orders     Lumbar Transforaminal Epidural  Medications ordered for procedure: Meds ordered this encounter  Medications  . oxyCODONE (OXY IR/ROXICODONE) 5 MG immediate release tablet    Sig: Take 1 tablet (5 mg total) by mouth every 8 (eight) hours as needed for severe pain.    Dispense:  90 tablet    Refill:  0    Do not place this medication, or any other prescription from our  practice, on "Automatic Refill". Patient may have prescription filled one day early if pharmacy is closed on scheduled refill date.    Order Specific Question:   Supervising Provider    Answer:   [name] [phone]  . pregabalin (LYRICA) 100 MG capsule    Sig: Take 1 capsule (100 mg total) by mouth 3 (three) times daily.    Dispense:  270 capsule    Refill:  0    Do not place this medication, or any other prescription from our practice, on "Automatic Refill". Patient may have prescription filled one day early if pharmacy is closed on scheduled refill date.    Order Specific Question:   Supervising Provider    Answer:   [name] [phone]  . tiZANidine (ZANAFLEX) 4 MG tablet    Sig: Take 1 tablet (4 mg total) by mouth 3 (three) times daily.    Dispense:  90 tablet    Refill:  2    Order Specific Question:   Supervising Provider    Answer:   [name] [phone]  . oxyCODONE (OXY IR/ROXICODONE) 5 MG immediate release tablet    Sig: Take 1 tablet (5 mg total) by mouth every 8 (eight) hours as needed for severe pain.    Dispense:  90 tablet    Refill:  0    Do not place this medication, or any other prescription from our practice, on "Automatic Refill". Patient may have prescription filled one day early if pharmacy is closed on scheduled refill date.    Order Specific Question:   Supervising Provider    Answer:   [name] [phone]  . oxyCODONE (OXY IR/ROXICODONE) 5 MG immediate release tablet    Sig: Take 1 tablet (5 mg total) by mouth every 8 (eight) hours as needed for severe pain.    Dispense:  90 tablet    Refill:  0    Do not place this medication, or any other prescription from our practice, on "Automatic Refill". Patient may have prescription filled one day early if pharmacy is closed on scheduled refill date.    Order Specific Question:   Supervising Provider    Answer:   [name] [[id number]]   Medications administered: Lubertha South had  no medications administered during this visit.  See the medical record for exact dosing, route, and time of administration.  Disposition: Discharge home  Discharge Date & Time: 04/30/2018; 1138 hrs.   Physician-requested Follow-up: Return in about 3 months (around 07/31/2018) for w/ Dr. Dossie Arbour, MedMgmt, in addition, Procedure(w/Sedation).  Future Appointments  Date Time Provider [place]  05/06/2018 11:15 AM [name], MD [facility] None  05/15/2018  7:45 AM [name], MD [facility] None  07/23/2018  1:45 PM Vevelyn Francois, NP [facility] None   Primary Care Physician: Jearld Fenton, NP Location: [facility] Note by: Dionisio David, NP Date: 04/30/2018; Time: 4:18 PM  Disclaimer:  Medicine is not an exact science. The only guarantee in medicine is that nothing is guaranteed. It is important to note that the decision to proceed with this intervention was based on the information collected from the patient. The Data and conclusions were drawn from the patient's questionnaire, the interview, and the physical examination. Because the information was provided in large part by the patient, it cannot be guaranteed that it has not been purposely or unconsciously manipulated. Every effort has been made to obtain as much relevant data as possible for this evaluation. It is important to note that the conclusions that lead to this procedure are derived in large part from the available data. Always take into account that the treatment will also be dependent on availability of resources and existing treatment guidelines, considered by other Pain Management Practitioners as being common knowledge and practice, at the time of the intervention. For Medico-Legal purposes, it is also important to point out that variation in procedural techniques and pharmacological choices are the acceptable norm. The indications, contraindications, technique, and results of the  above procedure should only be interpreted and judged by a Board-Certified Interventional Pain Specialist with extensive familiarity and expertise in the same exact procedure and technique.

## 2018-04-30 NOTE — Patient Instructions (Addendum)
____________________________________________________________________________________________  Medication Rules  Applies to: All patients receiving prescriptions (written or electronic).  Pharmacy of record: Pharmacy where electronic prescriptions will be sent. If written prescriptions are taken to a different pharmacy, please inform the nursing staff. The pharmacy listed in the electronic medical record should be the one where you would like electronic prescriptions to be sent.  Prescription refills: Only during scheduled appointments. Applies to both, written and electronic prescriptions.  NOTE: The following applies primarily to controlled substances (Opioid* Pain Medications).   Patient's responsibilities: 1. Pain Pills: Bring all pain pills to every appointment (except for procedure appointments). 2. Pill Bottles: Bring pills in original pharmacy bottle. Always bring newest bottle. Bring bottle, even if empty. 3. Medication refills: You are responsible for knowing and keeping track of what medications you need refilled. The day before your appointment, write a list of all prescriptions that need to be refilled. Bring that list to your appointment and give it to the admitting nurse. Prescriptions will be written only during appointments. If you forget a medication, it will not be "Called in", "Faxed", or "electronically sent". You will need to get another appointment to get these prescribed. 4. Prescription Accuracy: You are responsible for carefully inspecting your prescriptions before leaving our office. Have the discharge nurse carefully go over each prescription with you, before taking them home. Make sure that your name is accurately spelled, that your address is correct. Check the name and dose of your medication to make sure it is accurate. Check the number of pills, and the written instructions to make sure they are clear and accurate. Make sure that you are given enough medication to last  until your next medication refill appointment. 5. Taking Medication: Take medication as prescribed. Never take more pills than instructed. Never take medication more frequently than prescribed. Taking less pills or less frequently is permitted and encouraged, when it comes to controlled substances (written prescriptions).  6. Inform other Doctors: Always inform, all of your healthcare providers, of all the medications you take. 7. Pain Medication from other Providers: You are not allowed to accept any additional pain medication from any other Doctor or Healthcare provider. There are two exceptions to this rule. (see below) In the event that you require additional pain medication, you are responsible for notifying us, as stated below. 8. Medication Agreement: You are responsible for carefully reading and following our Medication Agreement. This must be signed before receiving any prescriptions from our practice. Safely store a copy of your signed Agreement. Violations to the Agreement will result in no further prescriptions. (Additional copies of our Medication Agreement are available upon request.) 9. Laws, Rules, & Regulations: All patients are expected to follow all Federal and State Laws, Statutes, Rules, & Regulations. Ignorance of the Laws does not constitute a valid excuse. The use of any illegal substances is prohibited. 10. Adopted CDC guidelines & recommendations: Target dosing levels will be at or below 60 MME/day. Use of benzodiazepines** is not recommended.  Exceptions: There are only two exceptions to the rule of not receiving pain medications from other Healthcare Providers. 1. Exception #1 (Emergencies): In the event of an emergency (i.e.: accident requiring emergency care), you are allowed to receive additional pain medication. However, you are responsible for: As soon as you are able, call our office [phone], at any time of the day or night, and leave a message stating your name, the  date and nature of the emergency, and the name and dose of the medication   prescribed. In the event that your call is answered by a member of our staff, make sure to document and save the date, time, and the name of the person that took your information.  2. Exception #2 (Planned Surgery): In the event that you are scheduled by another doctor or dentist to have any type of surgery or procedure, you are allowed (for a period no longer than 30 days), to receive additional pain medication, for the acute post-op pain. However, in this case, you are responsible for picking up a copy of our "Post-op Pain Management for Surgeons" handout, and giving it to your surgeon or dentist. This document is available at our office, and does not require an appointment to obtain it. Simply go to our office during business hours (Monday-Thursday from 8:00 AM to 4:00 PM) (Friday 8:00 AM to 12:00 Noon) or if you have a scheduled appointment with [place], prior to your surgery, and ask for it by name. In addition, you will need to provide [place] with your name, name of your surgeon, type of surgery, and date of procedure or surgery.  *Opioid medications include: morphine, codeine, oxycodone, oxymorphone, hydrocodone, hydromorphone, meperidine, tramadol, tapentadol, buprenorphine, fentanyl, methadone. **Benzodiazepine medications include: diazepam (Valium), alprazolam (Xanax), clonazepam (Klonopine), lorazepam (Ativan), clorazepate (Tranxene), chlordiazepoxide (Librium), estazolam (Prosom), oxazepam (Serax), temazepam (Restoril), triazolam (Halcion) (Last updated: 08/29/2017) ____________________________________________________________________________________________   BMI Assessment: Estimated body mass index is 33 kg/m as calculated from the following:   Height as of this encounter: 5\' 10"  (1.778 m).   Weight as of this encounter: 230 lb (104.3 kg).  BMI interpretation table:        BMI level Category Range association with higher  incidence of chronic pain  <18 kg/m2 Underweight    18.5-24.9 kg/m2 Ideal body weight    25-29.9 kg/m2 Overweight Increased incidence by 20%   30-34.9 kg/m2 Obese (Class I) Increased incidence by 68%   35-39.9 kg/m2 Severe obesity (Class II) Increased incidence by 136%   >40 kg/m2 Extreme obesity (Class III) Increased incidence by 254%    Patient's current BMI Ideal Body weight  Body mass index is 33 kg/m. Ideal body weight: 68.5 kg (151 lb 0.2 oz) Adjusted ideal body weight: 82.8 kg (182 lb 9.7 oz)      BMI Readings from Last 4 Encounters:  01/01/18 33.00 kg/m  11/27/17 33.00 kg/m  11/14/17 33.97 kg/m  10/30/17 33.23 kg/m      Wt Readings from Last 4 Encounters:  01/01/18 230 lb (104.3 kg)  11/27/17 230 lb (104.3 kg)  11/14/17 230 lb (104.3 kg)  10/30/17 225 lb (102.1 kg)   Oxycodone hcl 5 mg x 3 months to begin filling on 05/04/18 escribed to pharmacy  lyrica and zanaflex escribed to pharmacy

## 2018-04-30 NOTE — Progress Notes (Signed)
Nursing Pain Medication Assessment:  Safety precautions to be maintained throughout the outpatient stay will include: orient to surroundings, keep bed in low position, maintain call bell within reach at all times, provide assistance with transfer out of bed and ambulation.  Medication Inspection Compliance: Pill count conducted under aseptic conditions, in front of the patient. Neither the pills nor the bottle was removed from the patient's sight at any time. Once count was completed pills were immediately returned to the patient in their original bottle.  Medication: Oxycodone IR Pill/Patch Count: 21 of 90 pills remain Pill/Patch Appearance: Markings consistent with prescribed medication Bottle Appearance: Standard pharmacy container. Clearly labeled. Filled Date: 10 / 04 / 2019 Last Medication intake:  Today

## 2018-05-05 ENCOUNTER — Telehealth: Payer: Self-pay | Admitting: *Deleted

## 2018-05-05 DIAGNOSIS — M9903 Segmental and somatic dysfunction of lumbar region: Secondary | ICD-10-CM | POA: Diagnosis not present

## 2018-05-05 DIAGNOSIS — M62838 Other muscle spasm: Secondary | ICD-10-CM | POA: Diagnosis not present

## 2018-05-05 DIAGNOSIS — M5032 Other cervical disc degeneration, mid-cervical region, unspecified level: Secondary | ICD-10-CM | POA: Diagnosis not present

## 2018-05-05 DIAGNOSIS — M9906 Segmental and somatic dysfunction of lower extremity: Secondary | ICD-10-CM | POA: Diagnosis not present

## 2018-05-05 DIAGNOSIS — M9901 Segmental and somatic dysfunction of cervical region: Secondary | ICD-10-CM | POA: Diagnosis not present

## 2018-05-05 DIAGNOSIS — M5136 Other intervertebral disc degeneration, lumbar region: Secondary | ICD-10-CM | POA: Diagnosis not present

## 2018-05-05 NOTE — Telephone Encounter (Signed)
Spoke with Sheree,  States that [name] wrote an Rx for oxycodone 5 mg x 7 days to be taken q 4 hours for post surgical pain.  Qty 42.  Filled on 04/30/18 and picked up on 05/02/18.  She states she has not opened this bag or taken any of this Rx.  She feels that she is a "quandry" because she is not going to be able to fill her Rx from [facility] and will run out of medications and wants to know what she should do.  I told her I was not sure of the answer but that I would find out and let her know.

## 2018-05-05 NOTE — Telephone Encounter (Signed)
Please make her aware that she should not have any problems because that is for acute/post op pain. We are treating her chronic pain. Pharmacy should code it as such. Thanks

## 2018-05-05 NOTE — Telephone Encounter (Signed)
After speaking with [name], called [name] back to convey information that I was given from her.  [name] is going to try and fill her Rx from [place] and if she encounters a problem she will let us know.

## 2018-05-06 ENCOUNTER — Encounter: Payer: Self-pay | Admitting: Pain Medicine

## 2018-05-06 ENCOUNTER — Other Ambulatory Visit: Payer: Self-pay

## 2018-05-06 ENCOUNTER — Ambulatory Visit: Payer: Medicare Other | Attending: Pain Medicine | Admitting: Pain Medicine

## 2018-05-06 VITALS — BP 136/77 | HR 74 | Temp 98.5°F | Resp 18 | Ht 70.0 in | Wt 224.0 lb

## 2018-05-06 DIAGNOSIS — N189 Chronic kidney disease, unspecified: Secondary | ICD-10-CM | POA: Insufficient documentation

## 2018-05-06 DIAGNOSIS — G8929 Other chronic pain: Secondary | ICD-10-CM

## 2018-05-06 DIAGNOSIS — M25561 Pain in right knee: Secondary | ICD-10-CM

## 2018-05-06 DIAGNOSIS — M47816 Spondylosis without myelopathy or radiculopathy, lumbar region: Secondary | ICD-10-CM | POA: Insufficient documentation

## 2018-05-06 DIAGNOSIS — Z89411 Acquired absence of right great toe: Secondary | ICD-10-CM | POA: Diagnosis not present

## 2018-05-06 DIAGNOSIS — M797 Fibromyalgia: Secondary | ICD-10-CM | POA: Insufficient documentation

## 2018-05-06 DIAGNOSIS — Z7982 Long term (current) use of aspirin: Secondary | ICD-10-CM | POA: Diagnosis not present

## 2018-05-06 DIAGNOSIS — Z978 Presence of other specified devices: Secondary | ICD-10-CM | POA: Diagnosis not present

## 2018-05-06 DIAGNOSIS — R937 Abnormal findings on diagnostic imaging of other parts of musculoskeletal system: Secondary | ICD-10-CM | POA: Insufficient documentation

## 2018-05-06 DIAGNOSIS — I129 Hypertensive chronic kidney disease with stage 1 through stage 4 chronic kidney disease, or unspecified chronic kidney disease: Secondary | ICD-10-CM | POA: Diagnosis not present

## 2018-05-06 DIAGNOSIS — Z451 Encounter for adjustment and management of infusion pump: Secondary | ICD-10-CM | POA: Diagnosis not present

## 2018-05-06 DIAGNOSIS — G894 Chronic pain syndrome: Secondary | ICD-10-CM | POA: Diagnosis not present

## 2018-05-06 DIAGNOSIS — Z789 Other specified health status: Secondary | ICD-10-CM | POA: Insufficient documentation

## 2018-05-06 DIAGNOSIS — F329 Major depressive disorder, single episode, unspecified: Secondary | ICD-10-CM | POA: Diagnosis not present

## 2018-05-06 DIAGNOSIS — E1122 Type 2 diabetes mellitus with diabetic chronic kidney disease: Secondary | ICD-10-CM | POA: Insufficient documentation

## 2018-05-06 DIAGNOSIS — M545 Low back pain: Secondary | ICD-10-CM | POA: Diagnosis present

## 2018-05-06 DIAGNOSIS — Z87891 Personal history of nicotine dependence: Secondary | ICD-10-CM | POA: Insufficient documentation

## 2018-05-06 DIAGNOSIS — F419 Anxiety disorder, unspecified: Secondary | ICD-10-CM | POA: Insufficient documentation

## 2018-05-06 DIAGNOSIS — E7800 Pure hypercholesterolemia, unspecified: Secondary | ICD-10-CM | POA: Insufficient documentation

## 2018-05-06 DIAGNOSIS — M899 Disorder of bone, unspecified: Secondary | ICD-10-CM | POA: Diagnosis not present

## 2018-05-06 DIAGNOSIS — E1142 Type 2 diabetes mellitus with diabetic polyneuropathy: Secondary | ICD-10-CM | POA: Diagnosis not present

## 2018-05-06 DIAGNOSIS — M48062 Spinal stenosis, lumbar region with neurogenic claudication: Secondary | ICD-10-CM | POA: Diagnosis not present

## 2018-05-06 DIAGNOSIS — M961 Postlaminectomy syndrome, not elsewhere classified: Secondary | ICD-10-CM

## 2018-05-06 DIAGNOSIS — M5441 Lumbago with sciatica, right side: Secondary | ICD-10-CM | POA: Diagnosis not present

## 2018-05-06 DIAGNOSIS — L851 Acquired keratosis [keratoderma] palmaris et plantaris: Secondary | ICD-10-CM | POA: Diagnosis not present

## 2018-05-06 DIAGNOSIS — Z969 Presence of functional implant, unspecified: Secondary | ICD-10-CM | POA: Diagnosis not present

## 2018-05-06 DIAGNOSIS — Z9049 Acquired absence of other specified parts of digestive tract: Secondary | ICD-10-CM | POA: Insufficient documentation

## 2018-05-06 DIAGNOSIS — Z79891 Long term (current) use of opiate analgesic: Secondary | ICD-10-CM | POA: Insufficient documentation

## 2018-05-06 DIAGNOSIS — M47812 Spondylosis without myelopathy or radiculopathy, cervical region: Secondary | ICD-10-CM | POA: Insufficient documentation

## 2018-05-06 DIAGNOSIS — M170 Bilateral primary osteoarthritis of knee: Secondary | ICD-10-CM | POA: Diagnosis not present

## 2018-05-06 DIAGNOSIS — Z79899 Other long term (current) drug therapy: Secondary | ICD-10-CM | POA: Insufficient documentation

## 2018-05-06 DIAGNOSIS — Z833 Family history of diabetes mellitus: Secondary | ICD-10-CM | POA: Insufficient documentation

## 2018-05-06 DIAGNOSIS — G4733 Obstructive sleep apnea (adult) (pediatric): Secondary | ICD-10-CM | POA: Insufficient documentation

## 2018-05-06 DIAGNOSIS — M48061 Spinal stenosis, lumbar region without neurogenic claudication: Secondary | ICD-10-CM | POA: Insufficient documentation

## 2018-05-06 DIAGNOSIS — Z8249 Family history of ischemic heart disease and other diseases of the circulatory system: Secondary | ICD-10-CM | POA: Insufficient documentation

## 2018-05-06 DIAGNOSIS — Z89421 Acquired absence of other right toe(s): Secondary | ICD-10-CM | POA: Diagnosis not present

## 2018-05-06 DIAGNOSIS — Z9889 Other specified postprocedural states: Secondary | ICD-10-CM | POA: Insufficient documentation

## 2018-05-06 NOTE — Patient Instructions (Signed)
____________________________________________________________________________________________  Medication Rules  Applies to: All patients receiving prescriptions (written or electronic).  Pharmacy of record: Pharmacy where electronic prescriptions will be sent. If written prescriptions are taken to a different pharmacy, please inform the nursing staff. The pharmacy listed in the electronic medical record should be the one where you would like electronic prescriptions to be sent.  Prescription refills: Only during scheduled appointments. Applies to both, written and electronic prescriptions.  NOTE: The following applies primarily to controlled substances (Opioid* Pain Medications).   Patient's responsibilities: 1. Pain Pills: Bring all pain pills to every appointment (except for procedure appointments). 2. Pill Bottles: Bring pills in original pharmacy bottle. Always bring newest bottle. Bring bottle, even if empty. 3. Medication refills: You are responsible for knowing and keeping track of what medications you need refilled. The day before your appointment, write a list of all prescriptions that need to be refilled. Bring that list to your appointment and give it to the admitting nurse. Prescriptions will be written only during appointments. If you forget a medication, it will not be "Called in", "Faxed", or "electronically sent". You will need to get another appointment to get these prescribed. 4. Prescription Accuracy: You are responsible for carefully inspecting your prescriptions before leaving our office. Have the discharge nurse carefully go over each prescription with you, before taking them home. Make sure that your name is accurately spelled, that your address is correct. Check the name and dose of your medication to make sure it is accurate. Check the number of pills, and the written instructions to make sure they are clear and accurate. Make sure that you are given enough medication to last  until your next medication refill appointment. 5. Taking Medication: Take medication as prescribed. Never take more pills than instructed. Never take medication more frequently than prescribed. Taking less pills or less frequently is permitted and encouraged, when it comes to controlled substances (written prescriptions).  6. Inform other Doctors: Always inform, all of your healthcare providers, of all the medications you take. 7. Pain Medication from other Providers: You are not allowed to accept any additional pain medication from any other Doctor or Healthcare provider. There are two exceptions to this rule. (see below) In the event that you require additional pain medication, you are responsible for notifying us, as stated below. 8. Medication Agreement: You are responsible for carefully reading and following our Medication Agreement. This must be signed before receiving any prescriptions from our practice. Safely store a copy of your signed Agreement. Violations to the Agreement will result in no further prescriptions. (Additional copies of our Medication Agreement are available upon request.) 9. Laws, Rules, & Regulations: All patients are expected to follow all Federal and State Laws, Statutes, Rules, & Regulations. Ignorance of the Laws does not constitute a valid excuse. The use of any illegal substances is prohibited. 10. Adopted CDC guidelines & recommendations: Target dosing levels will be at or below 60 MME/day. Use of benzodiazepines** is not recommended.  Exceptions: There are only two exceptions to the rule of not receiving pain medications from other Healthcare Providers. 1. Exception #1 (Emergencies): In the event of an emergency (i.e.: accident requiring emergency care), you are allowed to receive additional pain medication. However, you are responsible for: As soon as you are able, call our office [phone], at any time of the day or night, and leave a message stating your name, the  date and nature of the emergency, and the name and dose of the medication   prescribed. In the event that your call is answered by a member of our staff, make sure to document and save the date, time, and the name of the person that took your information.  2. Exception #2 (Planned Surgery): In the event that you are scheduled by another doctor or dentist to have any type of surgery or procedure, you are allowed (for a period no longer than 30 days), to receive additional pain medication, for the acute post-op pain. However, in this case, you are responsible for picking up a copy of our "Post-op Pain Management for Surgeons" handout, and giving it to your surgeon or dentist. This document is available at our office, and does not require an appointment to obtain it. Simply go to our office during business hours (Monday-Thursday from 8:00 AM to 4:00 PM) (Friday 8:00 AM to 12:00 Noon) or if you have a scheduled appointment with us, prior to your surgery, and ask for it by name. In addition, you will need to provide us with your name, name of your surgeon, type of surgery, and date of procedure or surgery.  *Opioid medications include: morphine, codeine, oxycodone, oxymorphone, hydrocodone, hydromorphone, meperidine, tramadol, tapentadol, buprenorphine, fentanyl, methadone. **Benzodiazepine medications include: diazepam (Valium), alprazolam (Xanax), clonazepam (Klonopine), lorazepam (Ativan), clorazepate (Tranxene), chlordiazepoxide (Librium), estazolam (Prosom), oxazepam (Serax), temazepam (Restoril), triazolam (Halcion) (Last updated: 08/29/2017) ____________________________________________________________________________________________   ____________________________________________________________________________________________  Medication Recommendations and Reminders  Applies to: All patients receiving prescriptions (written and/or electronic).  Medication Rules & Regulations: These rules and  regulations exist for your safety and that of others. They are not flexible and neither are we. Dismissing or ignoring them will be considered "non-compliance" with medication therapy, resulting in complete and irreversible termination of such therapy. (See document titled "Medication Rules" for more details.) In all conscience, because of safety reasons, we cannot continue providing a therapy where the patient does not follow instructions.  Pharmacy of record:   Definition: This is the pharmacy where your electronic prescriptions will be sent.   We do not endorse any particular pharmacy.  You are not restricted in your choice of pharmacy.  The pharmacy listed in the electronic medical record should be the one where you want electronic prescriptions to be sent.  If you choose to change pharmacy, simply notify our nursing staff of your choice of new pharmacy.  Recommendations:  Keep all of your pain medications in a safe place, under lock and key, even if you live alone.   After you fill your prescription, take 1 week's worth of pills and put them away in a safe place. You should keep a separate, properly labeled bottle for this purpose. The remainder should be kept in the original bottle. Use this as your primary supply, until it runs out. Once it's gone, then you know that you have 1 week's worth of medicine, and it is time to come in for a prescription refill. If you do this correctly, it is unlikely that you will ever run out of medicine.  To make sure that the above recommendation works, it is very important that you make sure your medication refill appointments are scheduled at least 1 week before you run out of medicine. To do this in an effective manner, make sure that you do not leave the office without scheduling your next medication management appointment. Always ask the nursing staff to show you in your prescription , when your medication will be running out. Then arrange for the  receptionist to get you a return   appointment, at least 7 days before you run out of medicine. Do not wait until you have 1 or 2 pills left, to come in. This is very poor planning and does not take into consideration that we may need to cancel appointments due to bad weather, sickness, or emergencies affecting our staff.  "Partial Fill": If for any reason your pharmacy does not have enough pills/tablets to completely fill or refill your prescription, do not allow for a "partial fill". You will need a separate prescription to fill the remaining amount, which we will not provide. If the reason for the partial fill is your insurance, you will need to talk to the pharmacist about payment alternatives for the remaining tablets, but again, do not accept a partial fill.  Prescription refills and/or changes in medication(s):   Prescription refills, and/or changes in dose or medication, will be conducted only during scheduled medication management appointments. (Applies to both, written and electronic prescriptions.)  No refills on procedure days. No medication will be changed or started on procedure days. No changes, adjustments, and/or refills will be conducted on a procedure day. Doing so will interfere with the diagnostic portion of the procedure.  No phone refills. No medications will be "called into the pharmacy".  No Fax refills.  No weekend refills.  No Holliday refills.  No after hours refills.  Remember:  Business hours are:  Monday to Thursday 8:00 AM to 4:00 PM Provider's Schedule: [name] King, NP - Appointments are:  Medication management: Monday to Thursday 8:00 AM to 4:00 PM Bluma Buresh, MD - Appointments are:  Medication management: Monday and Wednesday 8:00 AM to 4:00 PM Procedure day: Tuesday and Thursday 7:30 AM to 4:00 PM Bilal Lateef, MD - Appointments are:  Medication management: Tuesday and Thursday 8:00 AM to 4:00 PM Procedure day: Monday and Wednesday 7:30 AM to  4:00 PM (Last update: 08/29/2017) ____________________________________________________________________________________________   ____________________________________________________________________________________________  CANNABIDIOL (AKA: CBD Oil or Pills)  Applies to: All patients receiving prescriptions of controlled substances (written and/or electronic).  General Information: Cannabidiol (CBD) was discovered in 1940. It is one of some 113 identified cannabinoids in cannabis (Marijuana) plants, accounting for up to 40% of the plant's extract. As of 2018, preliminary clinical research on cannabidiol included studies of anxiety, cognition, movement disorders, and pain.  Cannabidiol is consummed in multiple ways, including inhalation of cannabis smoke or vapor, as an aerosol spray into the cheek, and by mouth. It may be supplied as CBD oil containing CBD as the active ingredient (no added tetrahydrocannabinol (THC) or terpenes), a full-plant CBD-dominant hemp extract oil, capsules, dried cannabis, or as a liquid solution. CBD is thought not have the same psychoactivity as THC, and may affect the actions of THC. Studies suggest that CBD may interact with different biological targets, including cannabinoid receptors and other neurotransmitter receptors. As of 2018 the mechanism of action for its biological effects has not been determined.  In the [place], cannabidiol has a limited approval by the Food and Drug Administration (FDA) for treatment of only two types of epilepsy disorders. The side effects of long-term use of the drug include somnolence, decreased appetite, diarrhea, fatigue, malaise, weakness, sleeping problems, and others.  CBD remains a Schedule I drug prohibited for any use.  Legality: Some manufacturers ship CBD products nationally, an illegal action which the FDA has not enforced in 2018, with CBD remaining the subject of an FDA investigational new drug evaluation, and is  not considered legal as a dietary supplement or   food ingredient as of December 2018. Federal illegality has made it difficult historically to conduct research on CBD. CBD is openly sold in head shops and health food stores in some states where such sales have not been explicitly legalized.  Warning: Because it is not FDA approved for general use or treatment of pain, it is not required to undergo the same manufacturing controls as prescription drugs.  This means that the available cannabidiol (CBD) may be contaminated with THC.  If this is the case, it will trigger a positive urine drug screen (UDS) test for cannabinoids (Marijuana).  Because a positive UDS for illicit substances is a violation of our medication agreement, your opioid analgesics (pain medicine) may be permanently discontinued. (Last update: 09/19/2017) ____________________________________________________________________________________________     

## 2018-05-06 NOTE — Progress Notes (Signed)
Safety precautions to be maintained throughout the outpatient stay will include: orient to surroundings, keep bed in low position, maintain call bell within reach at all times, provide assistance with transfer out of bed and ambulation.  

## 2018-05-06 NOTE — Progress Notes (Signed)
Patient's Name: Melinda [name]  MRN: [id number]  Referring Provider: Jearld Fenton, NP  DOB: [birth year]  PCP: Jearld Fenton, NP  DOS: 05/06/2018  Note by: Gaspar Cola, MD  Service setting: Ambulatory outpatient  Specialty: Interventional Pain Management  Location: [facility] (AMB) Pain Management Facility    Patient type: Established   Primary Reason(s) for Visit: Encounter for prescription drug management. (Level of risk: moderate)  CC: Back Pain (lower)  HPI  Melinda [name] is a 62 y.o. year old, female patient, who comes today for a medication management evaluation. She has Chronic pain syndrome; CAD (coronary artery disease); Pure hypercholesterolemia; DM2 (diabetes mellitus, type 2) ([place]); Failed back surgical syndrome; Failed cervical surgery syndrome (ACDF); Chronic neck pain; Chronic low back pain (Bilateral) (L>R); Epidural fibrosis; Cervical spondylosis; Neuropathic pain; Fibromyalgia; Presence of functional implant (Medtronic programmable intrathecal pump) (Right abdominal area); Presence of intrathecal pump (Medtronic intrathecal programmable pump) (40 mL pump); Pharmacologic therapy; Opioid-induced constipation (OIC); Chronic knee pain (Primary Area of Pain) (Right); Osteoarthritis of knee (Left); Anxiety and depression; Long term current use of opiate analgesic; Long term prescription opiate use; Opiate use; Encounter for adjustment or management of infusion pump; Osteoarthritis, multiple sites; Osteoarthritis of knee (Bilateral) (R>L); Other secondary hypertension; OSA (obstructive sleep apnea); Hip pain, acute, left; Lumbar spondylosis; Degenerative arthritis of knee (Bilateral); Lumbar spinal stenosis w/ neurogenic claudication; Disorder of skeletal system; Problems influencing health status; Abnormal MRI, lumbar spine (04/01/2018); Lumbar facet hypertrophy (Multilevel); Lumbar foraminal stenosis; and Lumbar lateral recess stenosis on their problem list. Her primarily concern today is the  Back Pain (lower)  Pain Assessment: Location: Lower Back Radiating: both upper legs to middle of thigh Onset: More than a month ago Duration: Chronic pain Quality: Pressure Severity: 3 /10 (subjective, self-reported pain score)  Note: Reported level is inconsistent with clinical observations. Clinically the patient looks like a 2/10 A 2/10 is viewed as "Mild to Moderate" and described as noticeable and distracting. Impossible to hide from other people. More frequent flare-ups. Still possible to adapt and function close to normal. It can be very annoying and may have occasional stronger flare-ups. With discipline, patients may get used to it and adapt. Information on the proper use of the pain scale provided to the patient today. When using our objective Pain Scale, levels between 6 and 10/10 are said to belong in an emergency room, as it progressively worsens from a 6/10, described as severely limiting, requiring emergency care not usually available at an outpatient pain management facility. At a 6/10 level, communication becomes difficult and requires great effort. Assistance to reach the emergency department may be required. Facial flushing and profuse sweating along with potentially dangerous increases in heart rate and blood pressure will be evident. Timing: Intermittent Modifying factors: medications, rest, ice, stretching BP: 136/77  HR: 74  Melinda [name] was last scheduled for an appointment on Visit date not found for medication management. During today's appointment we reviewed Melinda [name]'s chronic pain status, as well as her outpatient medication regimen.  The patient  reports that she does not use drugs. Her body mass index is 32.14 kg/m.  Further details on both, my assessment(s), as well as the proposed treatment plan, please see below.  Intrathecal Pump Therapy Assessment  Manufacturer: Medtronic Synchromed II [phone] [id number]) Type: Programmable Volume: 40 mL reservoir MRI  compatibility: Yes   Drug content:  Primary Medication Class: Opioid Primary Medication: PF-Fentanyl (500 mcg/mL) Secondary Medication: PF-Bupivacaine (20 mg/mL) Other Medication: PF-Baclofen (300.0 mcg/mL)  Programming:  Type: Simple continuous. See pump readout for details.   Changes:  Medication Change: None at this point Rate Change: 20% increase  Reported side-effects or adverse reactions: None reported  Effectiveness: Described as relatively effective, allowing for increase in activities of daily living (ADL) Clinically meaningful improvement in function (CMIF): Sustained CMIF goals met  Plan: Analysis and programming with an increase in the rate of 20% to a total of 177.30 mcg/day of fentanyl  Controlled Substance Pharmacotherapy Assessment REMS (Risk Evaluation and Mitigation Strategy)  Analgesic: Oxycodone IR 5 mg every 8 hours (15 mg/day of oxycodone) MME/day: 22.5 mg/day.  Melinda Martins, RN  05/06/2018 11:29 AM  Sign at close encounter Safety precautions to be maintained throughout the outpatient stay will include: orient to surroundings, keep bed in low position, maintain call bell within reach at all times, provide assistance with transfer out of bed and ambulation.    Pharmacokinetics: Liberation and absorption (onset of action): WNL Distribution (time to peak effect): WNL Metabolism and excretion (duration of action): WNL         Pharmacodynamics: Desired effects: Analgesia: Melinda [name] reports >50% benefit. Functional ability: Patient reports that medication allows her to accomplish basic ADLs Clinically meaningful improvement in function (CMIF): Sustained CMIF goals met Perceived effectiveness: Described as relatively effective, allowing for increase in activities of daily living (ADL) Undesirable effects: Side-effects or Adverse reactions: None reported Monitoring: [place] PMP: Online review of the past 20-monthperiod conducted. Compliant with practice rules  and regulations Last UDS on record: Summary  Date Value Ref Range Status  01/23/2018 FINAL  Final    Comment:    ==================================================================== TOXASSURE SELECT 13 (MW) ==================================================================== Test                             Result       Flag       Units Drug Present and Declared for Prescription Verification   Oxycodone                      1016         EXPECTED   ng/mg creat   Oxymorphone                    159          EXPECTED   ng/mg creat   Noroxycodone                   1697         EXPECTED   ng/mg creat    Sources of oxycodone include scheduled prescription medications.    Oxymorphone and noroxycodone are expected metabolites of    oxycodone. Oxymorphone is also available as a scheduled    prescription medication. Drug Present not Declared for Prescription Verification   Fentanyl                       8            UNEXPECTED ng/mg creat (from intrathecal pump)   Norfentanyl                    94           UNEXPECTED ng/mg creat    Source of fentanyl is a scheduled prescription medication,    including IV, patch, and transmucosal formulations. Norfentanyl    is an expected metabolite of  fentanyl. ==================================================================== Test                      Result    Flag   Units      Ref Range   Creatinine              150              mg/dL      >=20 ==================================================================== Declared Medications:  The flagging and interpretation on this report are based on the  following declared medications.  Unexpected results may arise from  inaccuracies in the declared medications.  **Note: The testing scope of this panel includes these medications:  Oxycodone  **Note: The testing scope of this panel does not include following  reported medications:  Aspirin (Aspirin 81)  Bupropion  Cholecalciferol  Chondroitin  (Glucosamine-Chondroitin)  Chromium  Cinnamon Bark  Glipizide  Glucosamine (Glucosamine-Chondroitin)  Magnesium  Multivitamin  Naloxone  Nitroglycerin  Paroxetine  Polyethylene Glycol  Pregabalin  Rosuvastatin  Supplement (Omega-3)  Tizanidine  Ubiquinone (Coenzyme Q 10)  Vitamin E ==================================================================== For clinical consultation, please call [phone]. ====================================================================    UDS interpretation: Compliant          Medication Assessment Form: Reviewed. Patient indicates being compliant with therapy Treatment compliance: Compliant Risk Assessment Profile: Aberrant behavior: See prior evaluations. None observed or detected today Comorbid factors increasing risk of overdose: See prior notes. No additional risks detected today Opioid risk tool (ORT) (Total Score): 4 Personal History of Substance Abuse (SUD-Substance use disorder):  Alcohol: Negative  Illegal Drugs: Negative  Rx Drugs: Negative  ORT Risk Level calculation: Moderate Risk Risk of substance use disorder (SUD): Low Opioid Risk Tool - 05/06/18 1145      Family History of Substance Abuse   Alcohol  Positive Female    Illegal Drugs  Negative    Rx Drugs  Negative      Personal History of Substance Abuse   Alcohol  Negative    Illegal Drugs  Negative    Rx Drugs  Negative      Age   Age between 53-45 years   No      History of Preadolescent Sexual Abuse   History of Preadolescent Sexual Abuse  Negative or Female      Psychological Disease   Psychological Disease  Negative    ADD  Negative    OCD  Negative    Bipolar  Negative    Schizophrenia  Negative    Depression  Positive      Total Score   Opioid Risk Tool Scoring  4    Opioid Risk Interpretation  Moderate Risk      ORT Scoring interpretation table:  Score <3 = Low Risk for SUD  Score between 4-7 = Moderate Risk for SUD  Score >8 = High Risk for  Opioid Abuse   Risk Mitigation Strategies:  Patient Counseling: Covered Patient-Prescriber Agreement (PPA): Present and active  Notification to other healthcare providers: Done  Pharmacologic Plan: No change in therapy, at this time.             Laboratory Chemistry  Inflammation Markers (CRP: Acute Phase) (ESR: Chronic Phase) Lab Results  Component Value Date   CRP 0.2 09/18/2010   ESRSEDRATE 16 09/18/2010   LATICACIDVEN 0.8 08/14/2017                         Rheumatology Markers No results found.  Renal Function Markers Lab Results  Component Value Date   BUN 34 (H) 04/17/2018   CREATININE 1.08 (H) 04/17/2018   GFRAA >60 04/17/2018   GFRNONAA 55 (L) 04/17/2018                             Hepatic Function Markers Lab Results  Component Value Date   AST 26 08/14/2017   ALT 23 08/14/2017   ALBUMIN 4.3 08/14/2017   ALKPHOS 105 08/14/2017   HCVAB NEGATIVE 05/18/2016   LIPASE 11 10/04/2013                        Electrolytes Lab Results  Component Value Date   NA 143 04/17/2018   K 5.0 04/17/2018   CL 103 04/17/2018   CALCIUM 9.7 04/17/2018   MG 2.1 06/30/2013                        Neuropathy Markers Lab Results  Component Value Date   VITAMINB12 319 10/04/2013   FOLATE >20.0 10/04/2013   HGBA1C 5.7 (H) 04/17/2018   HIV Non Reactive 02/22/2017                        CNS Tests Lab Results  Component Value Date   COLORCSF COLORLESS 09/11/2015   APPEARCSF CLEAR 09/11/2015   RBCCOUNTCSF 400 (H) 09/11/2015   WBCCSF 1 09/11/2015   LYMPHSCSF RARE 10/06/2013   LYMPHSCSF RARE 10/06/2013   PROTEINCSF 48 (H) 09/11/2015   GLUCCSF 71 (H) 09/11/2015                        Bone Pathology Markers Lab Results  Component Value Date   VD25OH 35.80 08/06/2017                         Coagulation Parameters Lab Results  Component Value Date   INR 1.12 09/11/2015   LABPROT 14.6 09/11/2015   APTT 31 09/11/2015   PLT 223 04/17/2018   DDIMER 3.85 (H)  10/05/2013                        Cardiovascular Markers Lab Results  Component Value Date   TROPONINI <0.03 09/11/2015   HGB 13.5 04/17/2018   HCT 43.2 04/17/2018                         CA Markers No results found.  Note: Lab results reviewed.  Recent Diagnostic Imaging Results  MR Lumbar Spine Wo Contrast CLINICAL DATA:  62 year old female with progressed chronic low back pain radiating down both legs. No known injury.  EXAM: MRI LUMBAR SPINE WITHOUT CONTRAST  TECHNIQUE: Multiplanar, multisequence MR imaging of the lumbar spine was performed. No intravenous contrast was administered.  COMPARISON:  Lumbar radiographs 01/01/2018.  Lumbar MRI 06/29/2013.  FINDINGS: Segmentation: Normal on the comparison radiographs which is the same numbering system used on the 2014 MRI.  Alignment: Moderate dextroconvex lumbar scoliosis, apex at L2-L3 as demonstrated on the recent radiographs. Relatively preserved lumbar lordosis with otherwise stable vertebral height and alignment since 2014.  Vertebrae: No marrow edema or evidence of acute osseous abnormality. Degenerative endplate marrow signal changes but normal background bone marrow signal. Intact visible sacrum and SI joints.  Conus medullaris and cauda  equina: Conus extends to the L1 level. No lower spinal cord or conus signal abnormality.  Paraspinal and other soft tissues: Negative.  Disc levels:  T11-T12: Mild disc bulge. Mild to moderate facet hypertrophy. No stenosis.  T12-L1:  Mild disc bulge.  No stenosis.  L1-L2: Progressed chronic circumferential disc bulge and endplate spurring. Vacuum disc now. Progressed broad-based central to left paracentral component of disc (series 8, image 3) with mild facet hypertrophy. New moderate left lateral recess stenosis (left L2 nerve level) and mild spinal stenosis. New mild left L1 foraminal stenosis.  L2-L3: Chronic disc space loss and left eccentric  circumferential disc bulge with endplate spurring. Moderate facet hypertrophy. Increased mild spinal stenosis and moderate left lateral recess stenosis (left L3 nerve level). Stable mild left L2 foraminal stenosis.  L3-L4: Chronic circumferential disc bulge. Chronic moderate to severe facet hypertrophy. Increased moderate to severe spinal stenosis (series 8, image 16) with bilateral lateral recess stenosis. No significant foraminal stenosis.  L4-L5: Chronic right eccentric circumferential disc bulge and endplate spurring with moderate facet and ligament flavum hypertrophy. Chronic moderate to severe right lateral recess stenosis is stable but mild spinal stenosis has increased. Stable mild right L4 foraminal stenosis.  L5-S1: Chronic mostly far lateral disc osteophyte complex with postoperative changes to the posterior elements and mild to moderate facet hypertrophy. Chronic architectural distortion at the lateral recess. No spinal or lateral recess stenosis. Stable mild L5 foraminal stenosis on the right.  IMPRESSION: 1. In general there has been mild progression of chronic lumbar spine degeneration since the 2014 MRI. Underlying moderate dextroconvex lumbar scoliosis. No acute osseous abnormality. 2. Progressed moderate to severe spinal stenosis at L3-L4. New or increased moderate left lateral recess stenosis at L1-L2 and L2-L3, with chronic moderate to severe right lateral recess stenosis at L4-L5. New or increased mild spinal stenosis at L1-L2, L2-L3, and L4-L5.  Electronically Signed   By: Genevie Ann M.D.   On: 04/01/2018 13:51  Complexity Note: Imaging results reviewed. Results shared with Melinda [name], using Layman's terms.                         Meds   Current Outpatient Medications:  .  ACCU-CHEK AVIVA PLUS test strip, TEST 1 TIME DAILY **E11.9**, Disp: 200 each, Rfl: 2 .  ACCU-CHEK SOFTCLIX LANCETS lancets, Use to check blood sugar 1 time daily.  Dx: E11.42, Disp:  100 each, Rfl: 2 .  AMBULATORY NON FORMULARY MEDICATION, Medication Name: CPAP MASK OF CHOICE FOR HOME DEVICE, Disp: 1 each, Rfl: 0 .  aspirin 81 MG tablet, Take 81 mg by mouth daily., Disp: , Rfl:  .  buPROPion (WELLBUTRIN XL) 150 MG 24 hr tablet, Take 1 tablet (150 mg total) by mouth daily. MUST SCHEDULE ANNUAL EXAM, Disp: 90 tablet, Rfl: 3 .  Cinnamon 500 MG TABS, Take 1,000 mg by mouth daily., Disp: , Rfl:  .  Coenzyme Q10 (CO Q 10) 100 MG CAPS, Take 100 mg by mouth daily. , Disp: , Rfl:  .  glipiZIDE (GLUCOTROL XL) 2.5 MG 24 hr tablet, TAKE 1 TABLET BY MOUTH EVERY DAY WITH BREAKFAST (Patient taking differently: Take 2.5 mg by mouth daily with breakfast. ), Disp: 90 tablet, Rfl: 0 .  glucosamine-chondroitin 500-400 MG tablet, Take 2 tablets by mouth daily. , Disp: , Rfl:  .  Magnesium 250 MG TABS, Take 250 mg by mouth daily., Disp: , Rfl:  .  Misc Natural Products (TART CHERRY ADVANCED PO), Take  1,200 mg by mouth at bedtime. , Disp: , Rfl:  .  Multiple Vitamin (MULTIVITAMIN) tablet, Take 1 tablet by mouth daily., Disp: , Rfl:  .  naloxone (NARCAN) 2 MG/2ML injection, Inject 1 mL (1 mg total) into the muscle as needed (for opioid overdose). Inject content of syringe into thigh muscle. Call 911., Disp: 2 Syringe, Rfl: 1 .  nitroGLYCERIN (NITROSTAT) 0.4 MG SL tablet, Place 1 tablet (0.4 mg total) under the tongue every 5 (five) minutes as needed for chest pain., Disp: 25 tablet, Rfl: prn .  NONFORMULARY OR COMPOUNDED ITEM, 147.95 mcg by Epidural Infusion route Continuous EPIDURAL. Medtronic Neuromodulation pump Fentanyl 147.95 mcg, Baclofen, bupivicaine, Disp: , Rfl:  .  NONFORMULARY OR COMPOUNDED ITEM, 88.77 mcg by Epidural Infusion route Continuous EPIDURAL. Medtronic Neuromodulation pump: Fentanyl, baclofen 88.77, bupivicane, Disp: , Rfl:  .  NONFORMULARY OR COMPOUNDED ITEM, 5.918 mg by Epidural Infusion route Continuous EPIDURAL. Medtronic Neuromodulation pump: Fentanyl, Baclofen, Bupivicaine  5.918 mg, Disp: , Rfl:  .  Omega-3 Fatty Acids (FISH OIL PO), Take 1,600 mg by mouth 2 (two) times daily. , Disp: , Rfl:  .  [START ON 07/03/2018] oxyCODONE (OXY IR/ROXICODONE) 5 MG immediate release tablet, Take 1 tablet (5 mg total) by mouth every 8 (eight) hours as needed for severe pain., Disp: 90 tablet, Rfl: 0 .  [START ON 06/03/2018] oxyCODONE (OXY IR/ROXICODONE) 5 MG immediate release tablet, Take 1 tablet (5 mg total) by mouth every 8 (eight) hours as needed for severe pain., Disp: 90 tablet, Rfl: 0 .  oxyCODONE (OXY IR/ROXICODONE) 5 MG immediate release tablet, Take 1 tablet (5 mg total) by mouth every 8 (eight) hours as needed for severe pain., Disp: 90 tablet, Rfl: 0 .  PARoxetine (PAXIL) 20 MG tablet, TAKE ONE-HALF OF A TABLET BY MOUTH DAILY IN THE MORNING (Patient taking differently: Take 10 mg by mouth daily. TAKE ONE-HALF OF A TABLET BY MOUTH DAILY IN THE MORNING), Disp: 90 tablet, Rfl: 0 .  polyethylene glycol (MIRALAX / GLYCOLAX) packet, Take 17 g by mouth daily. , Disp: , Rfl:  .  pregabalin (LYRICA) 100 MG capsule, Take 1 capsule (100 mg total) by mouth 3 (three) times daily., Disp: 270 capsule, Rfl: 0 .  rosuvastatin (CRESTOR) 20 MG tablet, Take 1 tablet (20 mg total) by mouth daily., Disp: 30 tablet, Rfl: 11 .  tiZANidine (ZANAFLEX) 4 MG tablet, Take 1 tablet (4 mg total) by mouth 3 (three) times daily., Disp: 90 tablet, Rfl: 2 .  TURMERIC PO, Take 1 tablet by mouth 2 (two) times daily. , Disp: , Rfl:  .  Vitamin D, Ergocalciferol, (DRISDOL) 50000 units CAPS capsule, Take 1 capsule (50,000 Units total) by mouth every 7 (seven) days., Disp: 12 capsule, Rfl: 0 .  vitamin E 400 UNIT capsule, Take 400 Units by mouth daily., Disp: , Rfl:   ROS  Constitutional: Denies any fever or chills Gastrointestinal: No reported hemesis, hematochezia, vomiting, or acute GI distress Musculoskeletal: Denies any acute onset joint swelling, redness, loss of ROM, or weakness Neurological: No reported  episodes of acute onset apraxia, aphasia, dysarthria, agnosia, amnesia, paralysis, loss of coordination, or loss of consciousness  Allergies  Melinda [name] has No Known Allergies.  [place]  Drug: Melinda [name]  reports that she does not use drugs. Alcohol:  reports that she drinks alcohol. Tobacco:  reports that she quit smoking about 10 years ago. Her smoking use included cigarettes. She has a 48.00 pack-year smoking history. She has never used smokeless tobacco.  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/2010), Amputation of second toe, right, traumatic ([place]) (09/2017), Anemia, Anxiety, Blue toes, Bulging disc, CAD (coronary artery disease) (2009), Cardiac arrhythmia due to congenital heart disease, Chicken pox, Chronic fatigue, Chronic kidney disease, Coronary arteritis, Degenerative disc disease, Depression, Diabetes mellitus without complication ([place]), Facet joint disease, Fibromyalgia, Heart disease, Hyperlipidemia, IBS (irritable bowel syndrome), MCL deficiency, knee, MRSA (methicillin resistant staph aureus) culture positive (2011), Neuropathy (04/18/2010), Peripheral neuropathy, Restless leg syndrome, Sepsis ([place]) (09/2013), Sleep apnea (08/17/2003), and Spinal stenosis. Surgical: Melinda [name]  has a past surgical history that includes Foot surgery (Right); Hand surgery (Left); Gallbladder surgery; Ablation; Ablation; HEART STENT (2009); HAND SURGEY (Left); Foot surgery (Bilateral); Foot surgery (Right); Infusion pump implantation; Back surgery; Cholecystectomy (2003); Anterior cervical decomp/discectomy fusion (N/A, 07/28/2014); Coronary angioplasty; Carpal tunnel release (Right); Eye surgery (Bilateral, 2013); Amputation toe (Right, 02/01/2017); Irrigation and debridement foot (Right, 02/23/2017); Toe Surgery; Hammer toe surgery (Right, 10/17/2017); Intrathecal pump revision (N/A, 04/25/2018); and Intrathecal pump revision (Right, 04/25/2018). Family:  family history includes Colon cancer in her paternal grandfather; Diabetes in her father and paternal grandmother; Heart disease in her maternal grandfather; Lung cancer in her mother.  Constitutional Exam  General appearance: Well nourished, well developed, and well hydrated. In no apparent acute distress Vitals:   05/06/18 1127  BP: 136/77  Pulse: 74  Resp: 18  Temp: 98.5 F (36.9 C)  TempSrc: Oral  SpO2: 98%  Weight: 224 lb (101.6 kg)  Height: '5\' 10"'$  (1.778 m)   BMI Assessment: Estimated body mass index is 32.14 kg/m as calculated from the following:   Height as of this encounter: '5\' 10"'$  (1.778 m).   Weight as of this encounter: 224 lb (101.6 kg).  BMI interpretation table: BMI level Category Range association with higher incidence of chronic pain  <18 kg/m2 Underweight   18.5-24.9 kg/m2 Ideal body weight   25-29.9 kg/m2 Overweight Increased incidence by 20%  30-34.9 kg/m2 Obese (Class I) Increased incidence by 68%  35-39.9 kg/m2 Severe obesity (Class II) Increased incidence by 136%  >40 kg/m2 Extreme obesity (Class III) Increased incidence by 254%   Patient's current BMI Ideal Body weight  Body mass index is 32.14 kg/m. Ideal body weight: 68.5 kg (151 lb 0.2 oz) Adjusted ideal body weight: 81.7 kg (180 lb 3.3 oz)   BMI Readings from Last 4 Encounters:  05/06/18 32.14 kg/m  04/30/18 32.28 kg/m  04/25/18 32.74 kg/m  04/17/18 32.74 kg/m   Wt Readings from Last 4 Encounters:  05/06/18 224 lb (101.6 kg)  04/30/18 225 lb (102.1 kg)  04/25/18 228 lb 3.2 oz (103.5 kg)  04/17/18 228 lb 3.2 oz (103.5 kg)  Psych/Mental status: Alert, oriented x 3 (person, place, & time)       Eyes: PERLA Respiratory: No evidence of acute respiratory distress  Cervical Spine Area Exam  Skin & Axial Inspection: No masses, redness, edema, swelling, or associated skin lesions Alignment: Symmetrical Functional ROM: Unrestricted ROM      Stability: No instability detected Muscle  Tone/Strength: Functionally intact. No obvious neuro-muscular anomalies detected. Sensory (Neurological): Unimpaired Palpation: No palpable anomalies              Upper Extremity (UE) Exam    Side: Right upper extremity  Side: Left upper extremity  Skin & Extremity Inspection: Skin color, temperature, and hair growth are WNL. No peripheral edema or cyanosis. No masses, redness, swelling, asymmetry, or associated skin lesions. No  contractures.  Skin & Extremity Inspection: Skin color, temperature, and hair growth are WNL. No peripheral edema or cyanosis. No masses, redness, swelling, asymmetry, or associated skin lesions. No contractures.  Functional ROM: Unrestricted ROM          Functional ROM: Unrestricted ROM          Muscle Tone/Strength: Functionally intact. No obvious neuro-muscular anomalies detected.  Muscle Tone/Strength: Functionally intact. No obvious neuro-muscular anomalies detected.  Sensory (Neurological): Unimpaired          Sensory (Neurological): Unimpaired          Palpation: No palpable anomalies              Palpation: No palpable anomalies              Provocative Test(s):  Phalen's test: deferred Tinel's test: deferred Apley's scratch test (touch opposite shoulder):  Action 1 (Across chest): deferred Action 2 (Overhead): deferred Action 3 (LB reach): deferred   Provocative Test(s):  Phalen's test: deferred Tinel's test: deferred Apley's scratch test (touch opposite shoulder):  Action 1 (Across chest): deferred Action 2 (Overhead): deferred Action 3 (LB reach): deferred    Thoracic Spine Area Exam  Skin & Axial Inspection: No masses, redness, or swelling Alignment: Symmetrical Functional ROM: Unrestricted ROM Stability: No instability detected Muscle Tone/Strength: Functionally intact. No obvious neuro-muscular anomalies detected. Sensory (Neurological): Unimpaired Muscle strength & Tone: No palpable anomalies  Lumbar Spine Area Exam  Skin & Axial  Inspection: No masses, redness, or swelling Alignment: Symmetrical Functional ROM: Unrestricted ROM       Stability: No instability detected Muscle Tone/Strength: Functionally intact. No obvious neuro-muscular anomalies detected. Sensory (Neurological): Unimpaired Palpation: No palpable anomalies       Provocative Tests: Hyperextension/rotation test: deferred today       Lumbar quadrant test (Kemp's test): deferred today       Lateral bending test: deferred today       Patrick's Maneuver: deferred today                   FABER test: deferred today                   S-I anterior distraction/compression test: deferred today         S-I lateral compression test: deferred today         S-I Thigh-thrust test: deferred today         S-I Gaenslen's test: deferred today          Gait & Posture Assessment  Ambulation: Unassisted Gait: Relatively normal for age and body habitus Posture: WNL   Lower Extremity Exam    Side: Right lower extremity  Side: Left lower extremity  Stability: No instability observed          Stability: No instability observed          Skin & Extremity Inspection: Skin color, temperature, and hair growth are WNL. No peripheral edema or cyanosis. No masses, redness, swelling, asymmetry, or associated skin lesions. No contractures.  Skin & Extremity Inspection: Skin color, temperature, and hair growth are WNL. No peripheral edema or cyanosis. No masses, redness, swelling, asymmetry, or associated skin lesions. No contractures.  Functional ROM: Unrestricted ROM                  Functional ROM: Unrestricted ROM                  Muscle Tone/Strength: Functionally intact. No obvious neuro-muscular  anomalies detected.  Muscle Tone/Strength: Functionally intact. No obvious neuro-muscular anomalies detected.  Sensory (Neurological): Unimpaired  Sensory (Neurological): Unimpaired  Palpation: No palpable anomalies  Palpation: No palpable anomalies   Assessment  Primary Diagnosis  & Pertinent Problem List: The primary encounter diagnosis was Chronic pain syndrome. Diagnoses of Chronic knee pain (Primary Area of Pain) (Right), Chronic low back pain (Bilateral) (L>R), Failed back surgical syndrome, Failed cervical surgery syndrome (ACDF), Presence of functional implant (Medtronic programmable intrathecal pump) (Right abdominal area), Presence of intrathecal pump (Medtronic intrathecal programmable pump) (40 mL pump), Encounter for adjustment or management of infusion pump, Disorder of skeletal system, Pharmacologic therapy, Problems influencing health status, Abnormal MRI, lumbar spine (04/01/2018), Lumbar spinal stenosis w/ neurogenic claudication, Lumbar facet hypertrophy (Multilevel), Lumbar foraminal stenosis, and Lumbar lateral recess stenosis were also pertinent to this visit.  Status Diagnosis  Controlled Controlled Controlled 1. Chronic pain syndrome   2. Chronic knee pain (Primary Area of Pain) (Right)   3. Chronic low back pain (Bilateral) (L>R)   4. Failed back surgical syndrome   5. Failed cervical surgery syndrome (ACDF)   6. Presence of functional implant (Medtronic programmable intrathecal pump) (Right abdominal area)   7. 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 Encounter for adjustment or management of infusion pump   9. Disorder of skeletal system   10. Pharmacologic therapy   11. Problems influencing health status   12. Abnormal MRI, lumbar spine (04/01/2018)   13. Lumbar spinal stenosis w/ neurogenic claudication   14. Lumbar facet hypertrophy (Multilevel)   15. Lumbar foraminal stenosis   16. Lumbar lateral recess stenosis     Problems updated and reviewed during this visit: Problem  Abnormal MRI, lumbar spine (04/01/2018)   Disc levels: T11-12: Mild disc bulge. Mild to moderate facet hypertrophy. No stenosis. T12-L1:  Mild disc bulge.  No stenosis. L1-2: Progressed chronic circumferential disc bulge  and endplate spurring. Vacuum disc now. Progressed broad-based central to left paracentral component of disc (series 8, image 3) with mild facet hypertrophy. New moderate left lateral recess stenosis (left L2 nerve level) and mild spinal stenosis. New mild left L1 foraminal stenosis. L2-3: Chronic disc space loss and left eccentric circumferential disc bulge with endplate spurring. Moderate facet hypertrophy. Increased mild spinal stenosis and moderate left lateral recess stenosis (left L3 nerve level). Stable mild left L2 foraminal stenosis. L3-4: Chronic circumferential disc bulge. Chronic moderate to severe facet hypertrophy. Increased moderate to severe spinal stenosis (series 8, image 16) with bilateral lateral recess stenosis. No significant foraminal stenosis. L4-5: Chronic right eccentric circumferential disc bulge and endplate spurring with moderate facet and ligament flavum hypertrophy. Chronic moderate to severe right lateral recess stenosis is stable but mild spinal stenosis has increased. Stable mild right L4 foraminal stenosis. L5-S1: Chronic mostly far lateral disc osteophyte complex with postoperative changes to the posterior elements and mild to moderate facet hypertrophy. Chronic architectural distortion at the lateral recess. No spinal or lateral recess stenosis. Stable mild L5 foraminal stenosis on the right.  IMPRESSION: 1. In general there has been mild progression of chronic lumbar spine degeneration since the 2014 MRI. Underlying moderate dextroconvex lumbar scoliosis. No acute osseous abnormality. 2. Progressed moderate to severe spinal stenosis at L3-4. New or increased moderate left lateral recess stenosis at L1-2 and L2-3, with chronic moderate to severe right lateral recess stenosis at L4-5. New or increased mild spinal stenosis at L1-2, L2-3, and L4-5.   Lumbar facet hypertrophy (Multilevel)  Levels: T11-12: facet hypertrophy. L1-2: facet hypertrophy. L3-4: Severe facet  hypertrophy. L4-5: facet and ligament flavum hypertrophy. L5-S1: facet hypertrophy.   Lumbar Foraminal Stenosis   Levels: L1-2: left L1 foraminal stenosis. L2-3: left L2 foraminal stenosis. L4-5: right L4 foraminal stenosis. L5-S1: L5 foraminal stenosis on the right.   Lumbar lateral recess stenosis   Levels: L1-2: left lateral recess stenosis (left L2 nerve level) L2-3: left lateral recess stenosis (left L3 nerve level) L3-4: bilateral lateral recess stenosis L4-5: severe right lateral recess stenosis L5-S1: Chronic architectural distortion at the lateral recess.   Lumbar spinal stenosis w/ neurogenic claudication   Levels: L1-2: Mild spinal stenosis. L2-3: Mild spinal stenosis. L3-4: Severe spinal stenosis L4-5: Mild spinal stenosis has increased.   Degenerative arthritis of knee (Bilateral)   Bilateral injections March 12, 2017   Lumbar Spondylosis  Hip Pain, Acute, Left  Osteoarthritis of knee (Bilateral) (R>L)  Chronic knee pain (Primary Area of Pain) (Right)  Osteoarthritis of knee (Left)  Disorder of Skeletal System  Problems Influencing Health Status  Osa (Obstructive Sleep Apnea)  Pharmacologic Therapy  Other Secondary Hypertension  Anxiety and Depression  DM2 (diabetes mellitus, type 2) ([place])  Pure Hypercholesterolemia  Cad (Coronary Artery Disease)   No issues On asprin Working on diet and weight loss   Osteomyelitis (Hcc) (Resolved)  Headache (Resolved)  Compression Fracture (Resolved)  ANKLE PAIN (Resolved)   Qualifier: Diagnosis of  By: Tommy Medal MD, Cornelius     CHRONIC OSTEOMYELITIS ANKLE AND FOOT (Resolved)   Qualifier: Diagnosis of  By: Tommy Medal MD, Cornelius     Sepsis [facility]) (Resolved)  Thrombocytopenia ([place]) (Resolved)  Paronychia of Great Toe of Left Foot (Resolved)  Contact Dermatitis Due to Plant (Resolved)  Chest Pain (Resolved)  Hld (Hyperlipidemia) (Resolved)  Obesity (Bmi 30-39.9) (Resolved)  Anxiety and Depression  (Resolved)  Bacteremia (Resolved)  Hypotension (Resolved)  URI (Upper Respiratory Infection) (Resolved)  Uti (Urinary Tract Infection) (Resolved)  Acute renal failure (HCC) (Resolved)  Anemia (Resolved)  Nausea and Vomiting (Resolved)  Chest Pain (Resolved)  MRSA (Resolved)   Qualifier: Diagnosis of  By: Tommy Medal MD, Cornelius     FEVER, HX OF (Resolved)   Qualifier: Diagnosis of  By: Tommy Medal MD, [facility] of Care  Pharmacotherapy (Medications Ordered): No orders of the defined types were placed in this encounter.  Medications administered today: Melinda [name] had no medications administered during this visit.   Procedure Orders     PUMP REPROGRAM  Lab Orders     Sedimentation rate     C-reactive protein Imaging Orders  No imaging studies ordered today   Referral Orders  No referral(s) requested today   Interventional management options: Planned, scheduled, and/or pending:   Intrathecal pump management (refills and programming adjustments)   Considering:   Palliative/therapeutic intrathecal pump management (refills and programming adjustments)  Diagnostic left-sided L1 transforaminal ESI  Diagnostic left-sided L2 transforaminal ESI  Diagnostic right-sided L4 transforaminal ESI  Diagnostic right-sided L5 transforaminal ESI  Diagnostic (Midline) L1-2 LESI  Diagnostic (Midline) L2-3 LESI  Diagnostic (Midline) L3-4 LESI  Diagnostic (Midline) L4-5 LESI  Diagnostic left-sided L1-2 LESI  Diagnostic left-sided L2-3 LESI  Diagnostic left-sided L3-4 LESI  Diagnostic right-sided L3-4 LESI  Diagnostic right-sided L4-5 LESI  Diagnostic caudal ESI + diagnostic epidurogram  Possible Racz procedure  Diagnostic bilateral lumbar facet block  Possible bilateral lumbar facet RFA  Diagnostic bilateral intra-articular knee joint injection with local anesthetic and steroid  Diagnostic  bilateral genicular nerve block  Possible bilateral genicular nerve RFA  Possible  series of 5, bilateral, intra-articular Hyalgan knee injection  Diagnostic cervical epidural steroid injection  Diagnostic bilateral cervical facet blocks  Possible bilateral cervical facet RFA    Palliative PRN treatment(s):   None at this time   Provider-requested follow-up: No follow-ups on file.  Future Appointments  Date Time Provider [employer]  05/13/2018  3:00 PM Laverle Hobby, MD [facility] None  05/15/2018  7:45 AM [name], MD [facility] None  07/23/2018  1:45 PM Vevelyn Francois, NP [facility] None  08/06/2018  1:00 PM [name], MD [facility] None   Primary Care Physician: Jearld Fenton, NP Location: [facility] Note by: Gaspar Cola, MD Date: 05/06/2018; Time: 1:10 PM

## 2018-05-07 ENCOUNTER — Encounter: Payer: Self-pay | Admitting: Pain Medicine

## 2018-05-07 LAB — C-REACTIVE PROTEIN: CRP: 1 mg/L (ref 0–10)

## 2018-05-07 LAB — SEDIMENTATION RATE: Sed Rate: 20 mm/hr (ref 0–40)

## 2018-05-13 ENCOUNTER — Ambulatory Visit (INDEPENDENT_AMBULATORY_CARE_PROVIDER_SITE_OTHER): Payer: Medicare Other | Admitting: Internal Medicine

## 2018-05-13 ENCOUNTER — Encounter: Payer: Self-pay | Admitting: Internal Medicine

## 2018-05-13 VITALS — BP 132/78 | HR 86 | Resp 16 | Ht 70.0 in | Wt 230.0 lb

## 2018-05-13 DIAGNOSIS — G4733 Obstructive sleep apnea (adult) (pediatric): Secondary | ICD-10-CM | POA: Diagnosis not present

## 2018-05-13 DIAGNOSIS — I208 Other forms of angina pectoris: Secondary | ICD-10-CM | POA: Diagnosis not present

## 2018-05-13 NOTE — Patient Instructions (Signed)
Continue cpap every night.

## 2018-05-13 NOTE — Progress Notes (Signed)
[facility] Consultation      Assessment and Plan:  Obstructive sleep apnea. -obstructive sleep apnea, appears to be adequately treated at this time.  --Continue auto-CPAP.   Excessive daytime sleepiness. -She continues to have some residual daytime sleepiness. This is likely due to chronic opioid use, which is managed by the pain clinic. We will continue to monitor her daytime sleepiness.  Coronary artery disease.. -Sleep apnea can contribute to cardiac disease, therefore his continue to monitor this.  Restless leg syndrome. -Appears to be adequately controlled at this time, likely due to use of CPAP as well as opioids, which can help reduce symptoms of restless leg syndrome.  Chronic pain -She is on high-dose opioids, including a fentanyl pump, which may be contributing to her excessive sleepiness. -She has been on Provigil and Nuvigil in the past, which have subsequently been weaned off.  Anxiety/depression. -Depression can contribute to daytime sleepiness, she is also on Paxil, which can also contribute to daytime sleepiness. We'll continue to monitor.  Date: 05/13/2018  MRN# [id number] Melinda [name] [birth year]   Melinda [name] is a 62 y.o. old female seen in consultation for chief complaint of:    Chief Complaint  Patient presents with  . Sleep Apnea    pt has not been seen since 2016. She needs new supplies for cpap.    HPI:  The patient is a 62 year old female with a history significant for coronary artery disease, previous smoking, anxiety, chronic pain, restless leg syndrome.  She was previously seen in 2016, she was noted to have chronic obstructive sleep apnea, her supplies were renewed.  She was noted to have some residual daytime sleepiness, thought to be likely secondary to chronic opioid use managed by pain clinic. In the past it appeared that her OSA have been controlled with CPAP At last visit it was noted that she was on auto titrating CPAP  with pressure range 9-20.  She has previously been on both Provigil and Nuvigil in the past which have been weaned off. Today she notes that she has been doing well with PAP. She is more awake while on CPAP and feels that she is no longer snoring.  She remains on fentanyl pump for chronic pain. She continues to take oxycodone, lyrica, tanizidine daily managed by her pain physician.  She continues to have restless legs symptoms.   **CPAP download/14/19-11/05/2018>> raw data personally reviewed.  Average usage on days used is 6 hours 56 minutes usage greater than 4 hours is 73/91 days (81%).  Pressure ranges 9-20.  Median pressure 11, 95th percentile pressure 12, maximum pressure 13.  Leaks are within normal limits.  Residual apnea index is 3.2.  Overall this shows good compliance with CPAP with excellent control of obstructive sleep apnea.  Review and summary of old records, and previous testing: -Sleep study from 05/20/2003: AHI of 11, consistent with mild obstructive sleep apnea. Titration study from 08/17/2003, patient required a CPAP of 9, she was recommended to be on AutoPap of 5-18, though 9 appeared to be adequate. On my review of the tracings, it would appear that the patient had a residual AHI of only 3 with a CPAP of 9, indicating a level of 9 would be adequate. -Auto titrate study October 2014: My review of the tracings: CPAP with a low of 9 and a high of 20, showed very well-controlled apneas. Residual AHI of 1.7 with a median level of 10 -Review of the most recent charting shows that the patient  as of 05/20/2014 was doing well on a ResMed S9 through [employer]. 95th percentile was 11 with 100% compliance. Her residual AHI was 1.7. -In the past for CPAP level is been adjusted to between a level of 12 and 15. She is a history of smoking which she quit, history of restless leg syndrome-she has been on Nuvigil in the past     Allergies:  Patient has no known allergies.  Review of  Systems:  Constitutional: Feels well. Cardiovascular: Denies chest pain, exertional chest pain.  Pulmonary: Denies hemoptysis, pleuritic chest pain.   The remainder of systems were reviewed and were found to be negative other than what is documented in the HPI.    Physical Examination:   VS: BP 132/78 (BP Location: Left Arm, Cuff Size: Large)   Pulse 86   Resp 16   Ht 5\' 10"  (1.778 m)   Wt 230 lb (104.3 kg)   SpO2 93%   BMI 33.00 kg/m   General Appearance: No distress  Neuro:without focal findings, mental status, speech normal, alert and oriented HEENT: PERRLA, EOM intact Pulmonary: No wheezing, No rales  CardiovascularNormal S1,S2.  No m/r/g.  Abdomen: Benign, Soft, non-tender, No masses Renal:  No costovertebral tenderness  GU:  No performed at this time. Endoc: No evident thyromegaly, no signs of acromegaly or Cushing features Skin:   warm, no rashes, no ecchymosis  Extremities: normal, no cyanosis, clubbing.    LABORATORY PANEL:   CBC No results for input(s): WBC, HGB, HCT, PLT in the last 168 hours. ------------------------------------------------------------------------------------------------------------------  Chemistries  No results for input(s): NA, K, CL, CO2, GLUCOSE, BUN, CREATININE, CALCIUM, MG, AST, ALT, ALKPHOS, BILITOT in the last 168 hours.  Invalid input(s): GFRCGP ------------------------------------------------------------------------------------------------------------------  Cardiac Enzymes No results for input(s): TROPONINI in the last 168 hours. ------------------------------------------------------------  RADIOLOGY:  No results found.     Thank  you for the consultation and for allowing [facility] to assist in the care of your patient. Our recommendations are noted above.  Please contact us if we can be of further service.  Marda Stalker, M.D., F.C.C.P.  Board Certified in Internal Medicine, Pulmonary  Medicine, [place], and Sleep Medicine.  [facility] Office Number: [phone]

## 2018-05-14 DIAGNOSIS — M9901 Segmental and somatic dysfunction of cervical region: Secondary | ICD-10-CM | POA: Diagnosis not present

## 2018-05-14 DIAGNOSIS — M5136 Other intervertebral disc degeneration, lumbar region: Secondary | ICD-10-CM | POA: Diagnosis not present

## 2018-05-14 DIAGNOSIS — M9903 Segmental and somatic dysfunction of lumbar region: Secondary | ICD-10-CM | POA: Diagnosis not present

## 2018-05-14 DIAGNOSIS — M5032 Other cervical disc degeneration, mid-cervical region, unspecified level: Secondary | ICD-10-CM | POA: Diagnosis not present

## 2018-05-14 DIAGNOSIS — M9906 Segmental and somatic dysfunction of lower extremity: Secondary | ICD-10-CM | POA: Diagnosis not present

## 2018-05-14 DIAGNOSIS — M62838 Other muscle spasm: Secondary | ICD-10-CM | POA: Diagnosis not present

## 2018-05-15 ENCOUNTER — Ambulatory Visit
Admission: RE | Admit: 2018-05-15 | Discharge: 2018-05-15 | Disposition: A | Discharge status: Home / Self Care | Payer: Medicare Other | Source: Ambulatory Visit | Attending: Pain Medicine | Admitting: Pain Medicine

## 2018-05-15 ENCOUNTER — Encounter: Payer: Self-pay | Admitting: Pain Medicine

## 2018-05-15 ENCOUNTER — Other Ambulatory Visit: Payer: Self-pay

## 2018-05-15 ENCOUNTER — Ambulatory Visit (HOSPITAL_BASED_OUTPATIENT_CLINIC_OR_DEPARTMENT_OTHER): Payer: Medicare Other | Admitting: Pain Medicine

## 2018-05-15 VITALS — BP 101/60 | HR 75 | Temp 96.9°F | Resp 16 | Ht 70.0 in | Wt 230.0 lb

## 2018-05-15 DIAGNOSIS — M48062 Spinal stenosis, lumbar region with neurogenic claudication: Secondary | ICD-10-CM | POA: Diagnosis not present

## 2018-05-15 DIAGNOSIS — M545 Low back pain: Secondary | ICD-10-CM | POA: Insufficient documentation

## 2018-05-15 DIAGNOSIS — M48061 Spinal stenosis, lumbar region without neurogenic claudication: Secondary | ICD-10-CM

## 2018-05-15 DIAGNOSIS — M47816 Spondylosis without myelopathy or radiculopathy, lumbar region: Secondary | ICD-10-CM | POA: Insufficient documentation

## 2018-05-15 DIAGNOSIS — M961 Postlaminectomy syndrome, not elsewhere classified: Secondary | ICD-10-CM

## 2018-05-15 DIAGNOSIS — M5136 Other intervertebral disc degeneration, lumbar region: Secondary | ICD-10-CM | POA: Insufficient documentation

## 2018-05-15 DIAGNOSIS — G8929 Other chronic pain: Secondary | ICD-10-CM | POA: Diagnosis not present

## 2018-05-15 DIAGNOSIS — M5441 Lumbago with sciatica, right side: Secondary | ICD-10-CM

## 2018-05-15 MED ORDER — FENTANYL CITRATE (PF) 100 MCG/2ML IJ SOLN
25.0000 ug | INTRAMUSCULAR | Status: AC | PRN
  Administered 2018-05-15 (×2): 100 ug via INTRAVENOUS

## 2018-05-15 MED ORDER — IOPAMIDOL (ISOVUE-M 200) INJECTION 41%
10.0000 mL | Freq: Once | INTRAMUSCULAR | Status: AC
  Administered 2018-05-15: 10 mL via EPIDURAL

## 2018-05-15 MED ORDER — LACTATED RINGERS IV SOLN
1000.0000 mL | Freq: Once | INTRAVENOUS | Status: AC
  Administered 2018-05-15: 1000 mL via INTRAVENOUS

## 2018-05-15 MED ORDER — MIDAZOLAM HCL 5 MG/5ML IJ SOLN
INTRAMUSCULAR | Status: AC
  Filled 2018-05-15: qty 5

## 2018-05-15 MED ORDER — MIDAZOLAM HCL 5 MG/5ML IJ SOLN
1.0000 mg | INTRAMUSCULAR | Status: DC | PRN
  Administered 2018-05-15: 3 mg via INTRAVENOUS

## 2018-05-15 MED ORDER — IOPAMIDOL (ISOVUE-M 200) INJECTION 41%
INTRAMUSCULAR | Status: AC
  Filled 2018-05-15: qty 10

## 2018-05-15 MED ORDER — TRIAMCINOLONE ACETONIDE 40 MG/ML IJ SUSP
40.0000 mg | Freq: Once | INTRAMUSCULAR | Status: AC
  Administered 2018-05-15: 40 mg

## 2018-05-15 MED ORDER — SODIUM CHLORIDE (PF) 0.9 % IJ SOLN
INTRAMUSCULAR | Status: AC
  Filled 2018-05-15: qty 10

## 2018-05-15 MED ORDER — SODIUM CHLORIDE 0.9% FLUSH
2.0000 mL | Freq: Once | INTRAVENOUS | Status: AC
  Administered 2018-05-15: 10 mL

## 2018-05-15 MED ORDER — FENTANYL CITRATE (PF) 100 MCG/2ML IJ SOLN
INTRAMUSCULAR | Status: AC
  Filled 2018-05-15: qty 2

## 2018-05-15 MED ORDER — LIDOCAINE HCL 2 % IJ SOLN: 20.0000 mL | Freq: Once | INTRAMUSCULAR | Status: DC

## 2018-05-15 MED ORDER — ROPIVACAINE HCL 2 MG/ML IJ SOLN
2.0000 mL | Freq: Once | INTRAMUSCULAR | Status: AC
  Administered 2018-05-15: 10 mL via EPIDURAL

## 2018-05-15 MED ORDER — ROPIVACAINE HCL 2 MG/ML IJ SOLN
INTRAMUSCULAR | Status: AC
  Filled 2018-05-15: qty 10

## 2018-05-15 MED ORDER — TRIAMCINOLONE ACETONIDE 40 MG/ML IJ SUSP
INTRAMUSCULAR | Status: AC
  Filled 2018-05-15: qty 1

## 2018-05-15 MED ORDER — LIDOCAINE HCL 2 % IJ SOLN
INTRAMUSCULAR | Status: AC
  Filled 2018-05-15: qty 20

## 2018-05-15 NOTE — Progress Notes (Signed)
Safety precautions to be maintained throughout the outpatient stay will include: orient to surroundings, keep bed in low position, maintain call bell within reach at all times, provide assistance with transfer out of bed and ambulation.  

## 2018-05-15 NOTE — Progress Notes (Signed)
Patient's Name: Melinda [name]  MRN: [id number]  Referring Provider: Jearld Fenton, NP  DOB: [birth year]  PCP: Jearld Fenton, NP  DOS: 05/15/2018  Note by: Gaspar Cola, MD  Service setting: Ambulatory outpatient  Specialty: Interventional Pain Management  Patient type: Established  Location: [facility] (AMB) Pain Management Facility  Visit type: Interventional Procedure   Primary Reason for Visit: Interventional Pain Management Treatment. CC: Back Pain (low)  Procedure:          Anesthesia, Analgesia, Anxiolysis:  Type: Therapeutic Inter-Laminar Epidural Steroid Injection           Region: Lumbar Level: L2-3 Level. Laterality: Left-Sided Paramedial  Type: Moderate (Conscious) Sedation combined with Local Anesthesia Indication(s): Analgesia and Anxiety Route: Intravenous (IV) IV Access: Secured Sedation: Meaningful verbal contact was maintained at all times during the procedure  Local Anesthetic: Lidocaine 1-2%  Position: Prone with head of the table was raised to facilitate breathing.   Indications: 1. DDD (degenerative disc disease), lumbar   2. Failed back surgical syndrome   3. Lumbar foraminal stenosis   4. Lumbar lateral recess stenosis   5. Lumbar spinal stenosis w/ neurogenic claudication   6. Lumbar spondylosis   7. Chronic low back pain (Bilateral) (L>R)    Pain Score: Pre-procedure: 2 /10 Post-procedure: 0-No pain/10  Pre-op Assessment:  Melinda [name] is a 62 y.o. (year old), female patient, seen today for interventional treatment. 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 Melinda [name] has a current medication list which includes the following prescription(s): accu-chek aviva plus, accu-chek softclix lancets, AMBULATORY NON FORMULARY MEDICATION, aspirin, bupropion, cinnamon, co q 10, glipizide, glucosamine-chondroitin, magnesium, misc natural products, multivitamin, naloxone, nitroglycerin, NONFORMULARY OR COMPOUNDED ITEM, NONFORMULARY OR COMPOUNDED ITEM, NONFORMULARY OR COMPOUNDED ITEM, omega-3 fatty acids, oxycodone, oxycodone, oxycodone, paroxetine, polyethylene glycol, pregabalin, rosuvastatin, tizanidine, turmeric, vitamin d (ergocalciferol), and vitamin e, and the following Facility-Administered Medications: lidocaine and midazolam. Her primarily concern today is the Back Pain (low)  Initial Vital Signs:  Pulse/HCG Rate: 75ECG Heart Rate: 66 Temp: 98.3 F (36.8 C) Resp: 18 BP: 115/71 SpO2: 99 %  BMI: Estimated body mass index is 33 kg/m as calculated from the following:   Height as of this encounter: 5\' 10"  (1.778 m).   Weight as of this encounter: 230 lb (104.3 kg).  Risk Assessment: Allergies: Reviewed. She has No Known Allergies.  Allergy Precautions: None required Coagulopathies: Reviewed. None identified.  Blood-thinner therapy: None at this time Active Infection(s): Reviewed. None identified. Melinda [name] is afebrile  Site Confirmation: Melinda [name] was asked to confirm the procedure and laterality before marking the site Procedure checklist: Completed Consent: Before the procedure and under the influence of no sedative(s), amnesic(s), or anxiolytics, the patient was informed of the treatment options, risks and possible complications. To fulfill our ethical and legal obligations, as recommended by the American Medical Association's Code  of Ethics, I have informed the patient of my clinical impression; the nature and purpose of the treatment or procedure; the risks, benefits, and possible complications of the intervention; the alternatives, including doing  nothing; the risk(s) and benefit(s) of the alternative treatment(s) or procedure(s); and the risk(s) and benefit(s) of doing nothing. The patient was provided information about the general risks and possible complications associated with the procedure. These may include, but are not limited to: failure to achieve desired goals, infection, bleeding, organ or nerve damage, allergic reactions, paralysis, and death. In addition, the patient was informed of those risks and complications associated to Spine-related procedures, such as failure to decrease pain; infection (i.e.: Meningitis, epidural or intraspinal abscess); bleeding (i.e.: epidural hematoma, subarachnoid hemorrhage, or any other type of intraspinal or peri-dural bleeding); organ or nerve damage (i.e.: Any type of peripheral nerve, nerve root, or spinal cord injury) with subsequent damage to sensory, motor, and/or autonomic systems, resulting in permanent pain, numbness, and/or weakness of one or several areas of the body; allergic reactions; (i.e.: anaphylactic reaction); and/or death. Furthermore, the patient was informed of those risks and complications associated with the medications. These include, but are not limited to: allergic reactions (i.e.: anaphylactic or anaphylactoid reaction(s)); adrenal axis suppression; blood sugar elevation that in diabetics may result in ketoacidosis or comma; water retention that in patients with history of congestive heart failure may result in shortness of breath, pulmonary edema, and decompensation with resultant heart failure; weight gain; swelling or edema; medication-induced neural toxicity; particulate matter embolism and blood vessel occlusion with resultant organ, and/or nervous system  infarction; and/or aseptic necrosis of one or more joints. Finally, the patient was informed that Medicine is not an exact science; therefore, there is also the possibility of unforeseen or unpredictable risks and/or possible complications that may result in a catastrophic outcome. The patient indicated having understood very clearly. We have given the patient no guarantees and we have made no promises. Enough time was given to the patient to ask questions, all of which were answered to the patient's satisfaction. Ms. Arave has indicated that she wanted to continue with the procedure. Attestation: I, the ordering provider, attest that I have discussed with the patient the benefits, risks, side-effects, alternatives, likelihood of achieving goals, and potential problems during recovery for the procedure that I have provided informed consent. Date  Time: 05/15/2018  7:51 AM  Pre-Procedure Preparation:  Monitoring: As per clinic protocol. Respiration, ETCO2, SpO2, BP, heart rate and rhythm monitor placed and checked for adequate function Safety Precautions: Patient was assessed for positional comfort and pressure points before starting the procedure. Time-out: I initiated and conducted the "Time-out" before starting the procedure, as per protocol. The patient was asked to participate by confirming the accuracy of the "Time Out" information. Verification of the correct person, site, and procedure were performed and confirmed by me, the nursing staff, and the patient. "Time-out" conducted as per Joint Commission's Universal Protocol (UP.01.01.01). Time: 2956  Description of Procedure:          Target Area: The interlaminar space, initially targeting the lower laminar border of the superior vertebral body. Approach: Paramedial approach. Area Prepped: Entire Posterior Lumbar Region Prepping solution: ChloraPrep (2% chlorhexidine gluconate and 70% isopropyl alcohol) Safety Precautions: Aspiration looking  for blood return was conducted prior to all injections. At no point did we inject any substances, as a needle was being advanced. No attempts were made at seeking any paresthesias. Safe injection practices and needle disposal techniques used. Medications properly checked for expiration dates. SDV (single dose vial) medications used. Description of the Procedure: Protocol guidelines were followed. The procedure needle was introduced through the skin, ipsilateral to the reported pain, and  advanced to the target area. Bone was contacted and the needle walked caudad, until the lamina was cleared. The epidural space was identified using "loss-of-resistance technique" with 2-3 ml of PF-NaCl (0.9% NSS), in a 5cc LOR glass syringe.  Vitals:   05/15/18 0901 05/15/18 0910 05/15/18 0920 05/15/18 0930  BP: 104/65 (!) 88/54 100/66 101/60  Pulse:      Resp: 16 18 15 16   Temp:  (!) 96.9 F (36.1 C)    SpO2: 98% 98% 98% 99%  Weight:      Height:        Start Time: 0851 hrs. End Time: 0900 hrs.  Materials:  Needle(s) Type: Epidural needle Gauge: 17G Length: 3.5-in Medication(s): Please see orders for medications and dosing details.  Imaging Guidance (Spinal):          Type of Imaging Technique: Fluoroscopy Guidance (Spinal) Indication(s): Assistance in needle guidance and placement for procedures requiring needle placement in or near specific anatomical locations not easily accessible without such assistance. Exposure Time: Please see nurses notes. Contrast: Before injecting any contrast, we confirmed that the patient did not have an allergy to iodine, shellfish, or radiological contrast. Once satisfactory needle placement was completed at the desired level, radiological contrast was injected. Contrast injected under live fluoroscopy. No contrast complications. See chart for type and volume of contrast used. Fluoroscopic Guidance: I was personally present during the use of fluoroscopy. "Tunnel Vision  Technique" used to obtain the best possible view of the target area. Parallax error corrected before commencing the procedure. "Direction-depth-direction" technique used to introduce the needle under continuous pulsed fluoroscopy. Once target was reached, antero-posterior, oblique, and lateral fluoroscopic projection used confirm needle placement in all planes. Images permanently stored in EMR. Interpretation: I personally interpreted the imaging intraoperatively. Adequate needle placement confirmed in multiple planes. Appropriate spread of contrast into desired area was observed. No evidence of afferent or efferent intravascular uptake. No intrathecal or subarachnoid spread observed. Permanent images saved into the patient's record.  Antibiotic Prophylaxis:   Anti-infectives (From admission, onward)   None     Indication(s): None identified  Post-operative Assessment:  Post-procedure Vital Signs:  Pulse/HCG Rate: 7565 Temp: (!) 96.9 F (36.1 C) Resp: 16 BP: 101/60 SpO2: 99 %  EBL: None  Complications: No immediate post-treatment complications observed by team, or reported by patient.  Note: The patient tolerated the entire procedure well. A repeat set of vitals were taken after the procedure and the patient was kept under observation following institutional policy, for this type of procedure. Post-procedural neurological assessment was performed, showing return to baseline, prior to discharge. The patient was provided with post-procedure discharge instructions, including a section on how to identify potential problems. Should any problems arise concerning this procedure, the patient was given instructions to immediately contact us, at any time, without hesitation. In any case, we plan to contact the patient by telephone for a follow-up status report regarding this interventional procedure.  Comments:  No additional relevant information.  Plan of Care    Imaging Orders     DG C-Arm  1-60 Min-No Report  Procedure Orders     Lumbar Epidural Injection  Medications ordered for procedure: Meds ordered this encounter  Medications  . iopamidol (ISOVUE-M) 41 % intrathecal injection 10 mL    Must be Myelogram-compatible. If not available, you may substitute with a water-soluble, non-ionic, hypoallergenic, myelogram-compatible radiological contrast medium.  Marland Kitchen lidocaine (XYLOCAINE) 2 % (with pres) injection 400 mg  . midazolam (VERSED) 5 MG/5ML injection  1-2 mg    Make sure Flumazenil is available in the pyxis when using this medication. If oversedation occurs, administer 0.2 mg IV over 15 sec. If after 45 sec no response, administer 0.2 mg again over 1 min; may repeat at 1 min intervals; not to exceed 4 doses (1 mg)  . fentaNYL (SUBLIMAZE) injection 25-50 mcg    Make sure Narcan is available in the pyxis when using this medication. In the event of respiratory depression (RR< 8/min): Titrate NARCAN (naloxone) in increments of 0.1 to 0.2 mg IV at 2-3 minute intervals, until desired degree of reversal.  . lactated ringers infusion 1,000 mL  . sodium chloride flush (NS) 0.9 % injection 2 mL  . ropivacaine (PF) 2 mg/mL (0.2%) (NAROPIN) injection 2 mL  . triamcinolone acetonide (KENALOG-40) injection 40 mg   Medications administered: We administered iopamidol, midazolam, fentaNYL, lactated ringers, sodium chloride flush, ropivacaine (PF) 2 mg/mL (0.2%), and triamcinolone acetonide.  See the medical record for exact dosing, route, and time of administration.  Disposition: Discharge home  Discharge Date & Time: 05/15/2018; 0916 hrs.   Physician-requested Follow-up: Return for post-procedure eval (2 wks), w/ Dr. Dossie Arbour.  Future Appointments  Date Time Provider [place]  06/04/2018  1:45 PM [name], MD [facility] None  07/23/2018  1:45 PM Vevelyn Francois, NP [facility] None  08/06/2018  1:00 PM [name], MD [facility] None   Primary Care Physician: Jearld Fenton, NP Location: [facility] Out[facility] Management Facility Note by: Gaspar Cola, MD Date: 05/15/2018; Time: 11:47 AM  Disclaimer:  Medicine is not an exact science. The only guarantee in medicine is that nothing is guaranteed. It is important to note that the decision to proceed with this intervention was based on the information collected from the patient. The Data and conclusions were drawn from the patient's questionnaire, the interview, and the physical examination. Because the information was provided in large part by the patient, it cannot be guaranteed that it has not been purposely or unconsciously manipulated. Every effort has been made to obtain as much relevant data as possible for this evaluation. It is important to note that the conclusions that lead to this procedure are derived in large part from the available data. Always take into account that the treatment will also be dependent on availability of resources and existing treatment guidelines, considered by other Pain Management Practitioners as being common knowledge and practice, at the time of the intervention. For Medico-Legal purposes, it is also important to point out that variation in procedural techniques and pharmacological choices are the acceptable norm. The indications, contraindications, technique, and results of the above procedure should only be interpreted and judged by a Board-Certified Interventional Pain Specialist with extensive familiarity and expertise in the same exact procedure and technique.

## 2018-05-15 NOTE — Patient Instructions (Signed)

## 2018-05-16 ENCOUNTER — Telehealth: Payer: Self-pay

## 2018-05-16 NOTE — Telephone Encounter (Signed)
Post procedure phone call.  Patient states she is doing well.  

## 2018-05-19 DIAGNOSIS — M62838 Other muscle spasm: Secondary | ICD-10-CM | POA: Diagnosis not present

## 2018-05-19 DIAGNOSIS — M5032 Other cervical disc degeneration, mid-cervical region, unspecified level: Secondary | ICD-10-CM | POA: Diagnosis not present

## 2018-05-19 DIAGNOSIS — M9901 Segmental and somatic dysfunction of cervical region: Secondary | ICD-10-CM | POA: Diagnosis not present

## 2018-05-19 DIAGNOSIS — M5136 Other intervertebral disc degeneration, lumbar region: Secondary | ICD-10-CM | POA: Diagnosis not present

## 2018-05-19 DIAGNOSIS — M9906 Segmental and somatic dysfunction of lower extremity: Secondary | ICD-10-CM | POA: Diagnosis not present

## 2018-05-19 DIAGNOSIS — M9903 Segmental and somatic dysfunction of lumbar region: Secondary | ICD-10-CM | POA: Diagnosis not present

## 2018-05-22 DIAGNOSIS — M5032 Other cervical disc degeneration, mid-cervical region, unspecified level: Secondary | ICD-10-CM | POA: Diagnosis not present

## 2018-05-22 DIAGNOSIS — M5136 Other intervertebral disc degeneration, lumbar region: Secondary | ICD-10-CM | POA: Diagnosis not present

## 2018-05-22 DIAGNOSIS — M9903 Segmental and somatic dysfunction of lumbar region: Secondary | ICD-10-CM | POA: Diagnosis not present

## 2018-05-22 DIAGNOSIS — M9906 Segmental and somatic dysfunction of lower extremity: Secondary | ICD-10-CM | POA: Diagnosis not present

## 2018-05-22 DIAGNOSIS — M9901 Segmental and somatic dysfunction of cervical region: Secondary | ICD-10-CM | POA: Diagnosis not present

## 2018-05-22 DIAGNOSIS — M62838 Other muscle spasm: Secondary | ICD-10-CM | POA: Diagnosis not present

## 2018-05-26 DIAGNOSIS — M9906 Segmental and somatic dysfunction of lower extremity: Secondary | ICD-10-CM | POA: Diagnosis not present

## 2018-05-26 DIAGNOSIS — M9903 Segmental and somatic dysfunction of lumbar region: Secondary | ICD-10-CM | POA: Diagnosis not present

## 2018-05-26 DIAGNOSIS — M5136 Other intervertebral disc degeneration, lumbar region: Secondary | ICD-10-CM | POA: Diagnosis not present

## 2018-05-26 DIAGNOSIS — M9901 Segmental and somatic dysfunction of cervical region: Secondary | ICD-10-CM | POA: Diagnosis not present

## 2018-05-26 DIAGNOSIS — M5032 Other cervical disc degeneration, mid-cervical region, unspecified level: Secondary | ICD-10-CM | POA: Diagnosis not present

## 2018-05-26 DIAGNOSIS — M62838 Other muscle spasm: Secondary | ICD-10-CM | POA: Diagnosis not present

## 2018-05-28 ENCOUNTER — Other Ambulatory Visit: Payer: Self-pay | Admitting: Family Medicine

## 2018-06-02 ENCOUNTER — Encounter: Payer: Self-pay | Admitting: Internal Medicine

## 2018-06-02 DIAGNOSIS — M5032 Other cervical disc degeneration, mid-cervical region, unspecified level: Secondary | ICD-10-CM | POA: Diagnosis not present

## 2018-06-02 DIAGNOSIS — M5136 Other intervertebral disc degeneration, lumbar region: Secondary | ICD-10-CM | POA: Diagnosis not present

## 2018-06-02 DIAGNOSIS — M9903 Segmental and somatic dysfunction of lumbar region: Secondary | ICD-10-CM | POA: Diagnosis not present

## 2018-06-02 DIAGNOSIS — M9906 Segmental and somatic dysfunction of lower extremity: Secondary | ICD-10-CM | POA: Diagnosis not present

## 2018-06-02 DIAGNOSIS — M62838 Other muscle spasm: Secondary | ICD-10-CM | POA: Diagnosis not present

## 2018-06-02 DIAGNOSIS — M9901 Segmental and somatic dysfunction of cervical region: Secondary | ICD-10-CM | POA: Diagnosis not present

## 2018-06-04 ENCOUNTER — Other Ambulatory Visit: Payer: Self-pay

## 2018-06-04 ENCOUNTER — Encounter: Payer: Self-pay | Admitting: Pain Medicine

## 2018-06-04 ENCOUNTER — Ambulatory Visit: Payer: Medicare Other | Attending: Pain Medicine | Admitting: Pain Medicine

## 2018-06-04 VITALS — BP 133/95 | HR 74 | Temp 98.7°F | Resp 18 | Ht 70.0 in | Wt 215.0 lb

## 2018-06-04 DIAGNOSIS — E1142 Type 2 diabetes mellitus with diabetic polyneuropathy: Secondary | ICD-10-CM | POA: Insufficient documentation

## 2018-06-04 DIAGNOSIS — G8929 Other chronic pain: Secondary | ICD-10-CM | POA: Diagnosis not present

## 2018-06-04 DIAGNOSIS — M797 Fibromyalgia: Secondary | ICD-10-CM | POA: Diagnosis not present

## 2018-06-04 DIAGNOSIS — M47816 Spondylosis without myelopathy or radiculopathy, lumbar region: Secondary | ICD-10-CM

## 2018-06-04 DIAGNOSIS — I2510 Atherosclerotic heart disease of native coronary artery without angina pectoris: Secondary | ICD-10-CM | POA: Diagnosis not present

## 2018-06-04 DIAGNOSIS — M5136 Other intervertebral disc degeneration, lumbar region: Secondary | ICD-10-CM | POA: Diagnosis not present

## 2018-06-04 DIAGNOSIS — Z7982 Long term (current) use of aspirin: Secondary | ICD-10-CM | POA: Diagnosis not present

## 2018-06-04 DIAGNOSIS — I208 Other forms of angina pectoris: Secondary | ICD-10-CM | POA: Diagnosis not present

## 2018-06-04 DIAGNOSIS — E1122 Type 2 diabetes mellitus with diabetic chronic kidney disease: Secondary | ICD-10-CM | POA: Diagnosis not present

## 2018-06-04 DIAGNOSIS — Z79891 Long term (current) use of opiate analgesic: Secondary | ICD-10-CM | POA: Diagnosis not present

## 2018-06-04 DIAGNOSIS — Z87891 Personal history of nicotine dependence: Secondary | ICD-10-CM | POA: Diagnosis not present

## 2018-06-04 DIAGNOSIS — F419 Anxiety disorder, unspecified: Secondary | ICD-10-CM | POA: Diagnosis not present

## 2018-06-04 DIAGNOSIS — G894 Chronic pain syndrome: Secondary | ICD-10-CM | POA: Diagnosis not present

## 2018-06-04 DIAGNOSIS — M25552 Pain in left hip: Secondary | ICD-10-CM | POA: Diagnosis not present

## 2018-06-04 DIAGNOSIS — Z79899 Other long term (current) drug therapy: Secondary | ICD-10-CM | POA: Diagnosis not present

## 2018-06-04 DIAGNOSIS — N189 Chronic kidney disease, unspecified: Secondary | ICD-10-CM | POA: Diagnosis not present

## 2018-06-04 DIAGNOSIS — M961 Postlaminectomy syndrome, not elsewhere classified: Secondary | ICD-10-CM | POA: Diagnosis not present

## 2018-06-04 DIAGNOSIS — Z8249 Family history of ischemic heart disease and other diseases of the circulatory system: Secondary | ICD-10-CM | POA: Insufficient documentation

## 2018-06-04 DIAGNOSIS — M47812 Spondylosis without myelopathy or radiculopathy, cervical region: Secondary | ICD-10-CM | POA: Diagnosis not present

## 2018-06-04 DIAGNOSIS — F329 Major depressive disorder, single episode, unspecified: Secondary | ICD-10-CM | POA: Insufficient documentation

## 2018-06-04 DIAGNOSIS — Z833 Family history of diabetes mellitus: Secondary | ICD-10-CM | POA: Insufficient documentation

## 2018-06-04 DIAGNOSIS — M5441 Lumbago with sciatica, right side: Secondary | ICD-10-CM | POA: Diagnosis not present

## 2018-06-04 DIAGNOSIS — Z9049 Acquired absence of other specified parts of digestive tract: Secondary | ICD-10-CM | POA: Insufficient documentation

## 2018-06-04 DIAGNOSIS — Z89411 Acquired absence of right great toe: Secondary | ICD-10-CM | POA: Diagnosis not present

## 2018-06-04 DIAGNOSIS — M48062 Spinal stenosis, lumbar region with neurogenic claudication: Secondary | ICD-10-CM | POA: Diagnosis not present

## 2018-06-04 DIAGNOSIS — G4733 Obstructive sleep apnea (adult) (pediatric): Secondary | ICD-10-CM | POA: Diagnosis not present

## 2018-06-04 DIAGNOSIS — M170 Bilateral primary osteoarthritis of knee: Secondary | ICD-10-CM | POA: Diagnosis not present

## 2018-06-04 DIAGNOSIS — M48061 Spinal stenosis, lumbar region without neurogenic claudication: Secondary | ICD-10-CM | POA: Diagnosis not present

## 2018-06-04 DIAGNOSIS — E7800 Pure hypercholesterolemia, unspecified: Secondary | ICD-10-CM | POA: Diagnosis not present

## 2018-06-04 DIAGNOSIS — E119 Type 2 diabetes mellitus without complications: Secondary | ICD-10-CM | POA: Insufficient documentation

## 2018-06-04 DIAGNOSIS — Z9889 Other specified postprocedural states: Secondary | ICD-10-CM | POA: Insufficient documentation

## 2018-06-04 MED ORDER — GLIPIZIDE ER 2.5 MG PO TB24: 2.5000 mg | ORAL_TABLET | Freq: Every day | ORAL | 0 refills | Status: DC

## 2018-06-04 MED ORDER — PAROXETINE HCL 20 MG PO TABS: ORAL_TABLET | ORAL | 0 refills | Status: DC

## 2018-06-04 NOTE — Progress Notes (Signed)
Patient's Name: Melinda [name]  MRN: [id number]  Referring Provider: Jearld Fenton, NP  DOB: 1955-08-15  PCP: Jearld Fenton, NP  DOS: 06/04/2018  Note by: Gaspar Cola, MD  Service setting: Ambulatory outpatient  Specialty: Interventional Pain Management  Location: [facility] (AMB) Pain Management Facility    Patient type: Established   Primary Reason(s) for Visit: Encounter for post-procedure evaluation of chronic illness with mild to moderate exacerbation CC: Back Pain (low)  HPI  Melinda [name] is a 61 y.o. year old, female patient, who comes today for a post-procedure evaluation. She has Chronic pain syndrome; CAD (coronary artery disease); Pure hypercholesterolemia; DM2 (diabetes mellitus, type 2) ([place]); Failed back surgical syndrome; Failed cervical surgery syndrome (ACDF); Chronic neck pain; Chronic low back pain (Bilateral) (L>R); Epidural fibrosis; Cervical spondylosis; Neuropathic pain; Fibromyalgia; Presence of functional implant (Medtronic programmable intrathecal pump) (Right abdominal area); Presence of intrathecal pump (Medtronic intrathecal programmable pump) (40 mL pump); Pharmacologic therapy; Opioid-induced constipation (OIC); Chronic knee pain (Primary Area of Pain) (Right); Osteoarthritis of knee (Left); Anxiety and depression; Long term current use of opiate analgesic; Long term prescription opiate use; Opiate use; Encounter for adjustment or management of infusion pump; Osteoarthritis, multiple sites; Osteoarthritis of knee (Bilateral) (R>L); Other secondary hypertension; OSA (obstructive sleep apnea); Hip pain, acute, left; Lumbar spondylosis; Degenerative arthritis of knee (Bilateral); Lumbar spinal stenosis w/ neurogenic claudication; Disorder of skeletal system; Problems influencing health status; Abnormal MRI, lumbar spine (04/01/2018); Lumbar facet hypertrophy (Multilevel); Lumbar foraminal stenosis; Lumbar lateral recess stenosis; and DDD (degenerative disc disease), lumbar on  their problem list. Her primarily concern today is the Back Pain (low)  Pain Assessment: Location: Lower Back Radiating: denies Onset: More than a month ago Quality: Aching, Dull Severity: 1 /10 (subjective, self-reported pain score)  Note: Reported level is compatible with observation.                         When using our objective Pain Scale, levels between 6 and 10/10 are said to belong in an emergency room, as it progressively worsens from a 6/10, described as severely limiting, requiring emergency care not usually available at an outpatient pain management facility. At a 6/10 level, communication becomes difficult and requires great effort. Assistance to reach the emergency department may be required. Facial flushing and profuse sweating along with potentially dangerous increases in heart rate and blood pressure will be evident. Effect on ADL: "I can do alittle more than prior to procedue" Timing: Intermittent Modifying factors: injections, pump, medications BP: (!) 133/95  HR: 74  Melinda [name] comes in today for post-procedure evaluation.  Further details on both, my assessment(s), as well as the proposed treatment plan, please see below.  Post-Procedure Assessment  05/15/2018 Procedure: Palliative left-sided L2-3 interlaminar LESI #1 under fluoroscopic guidance and IV sedation Pre-procedure pain score:  2/10 Post-procedure pain score: 0/10 (100% relief) Influential Factors: BMI: 30.85 kg/m Intra-procedural challenges: None observed.         Assessment challenges: None detected.              Reported side-effects: None.        Post-procedural adverse reactions or complications: None reported         Sedation: Sedation provided. When no sedatives are used, the analgesic levels obtained are directly associated to the effectiveness of the local anesthetics. However, when sedation is provided, the level of analgesia obtained during the initial 1 hour following the intervention, is  believed to  be the result of a combination of factors. These factors may include, but are not limited to: 1. The effectiveness of the local anesthetics used. 2. The effects of the analgesic(s) and/or anxiolytic(s) used. 3. The degree of discomfort experienced by the patient at the time of the procedure. 4. The patients ability and reliability in recalling and recording the events. 5. The presence and influence of possible secondary gains and/or psychosocial factors. Reported result: Relief experienced during the 1st hour after the procedure: 100 % (Ultra-Short Term Relief) Melinda [name] has indicated area to have been numb during this time. Interpretative annotation: Clinically appropriate result. Analgesia during this period is likely to be Local Anesthetic and/or IV Sedative (Analgesic/Anxiolytic) related.          Effects of local anesthetic: The analgesic effects attained during this period are directly associated to the localized infiltration of local anesthetics and therefore cary significant diagnostic value as to the etiological location, or anatomical origin, of the pain. Expected duration of relief is directly dependent on the pharmacodynamics of the local anesthetic used. Long-acting (4-6 hours) anesthetics used.  Reported result: Relief during the next 4 to 6 hour after the procedure: 100 % (Short-Term Relief)            Interpretative annotation: Clinically appropriate result. Analgesia during this period is likely to be Local Anesthetic-related.          Long-term benefit: Defined as the period of time past the expected duration of local anesthetics (1 hour for short-acting and 4-6 hours for long-acting). With the possible exception of prolonged sympathetic blockade from the local anesthetics, benefits during this period are typically attributed to, or associated with, other factors such as analgesic sensory neuropraxia, antiinflammatory effects, or beneficial biochemical changes provided by  agents other than the local anesthetics.  Reported result: Extended relief following procedure: 100 %(lasting 2 weeks then is currently 90%) (Long-Term Relief)            Interpretative annotation: Clinically possible results. Good relief. No permanent benefit expected. Inflammation plays a part in the etiology to the pain.          Current benefits: Defined as reported results that persistent at this point in time.   Analgesia: 90 % Ms. Goren reports improvement of axial and extremity symptoms. Function: Ms. Faux reports improvement in function ROM: Ms. Millican reports improvement in ROM Interpretative annotation: Ongoing benefit. Therapeutic success. Effective therapeutic approach.          Interpretation: Results would suggest a successful diagnostic intervention.                  Plan:  Please see "Plan of Care" for details.                Laboratory Chemistry  Inflammation Markers (CRP: Acute Phase) (ESR: Chronic Phase) Lab Results  Component Value Date   CRP 1 05/06/2018   ESRSEDRATE 20 05/06/2018   LATICACIDVEN 0.8 08/14/2017                         Rheumatology Markers No results found for: RF, ANA, LABURIC, URICUR, LYMEIGGIGMAB, LYMEABIGMQN, HLAB27                      Renal Markers Lab Results  Component Value Date   BUN 34 (H) 04/17/2018   CREATININE 1.08 (H) 04/17/2018   GFRAA >60 04/17/2018   GFRNONAA 55 (L) 04/17/2018  Hepatic Markers Lab Results  Component Value Date   AST 26 08/14/2017   ALT 23 08/14/2017   ALBUMIN 4.3 08/14/2017   HCVAB NEGATIVE 05/18/2016                        Neuropathy Markers Lab Results  Component Value Date   VITAMINB12 319 10/04/2013   HGBA1C 5.7 (H) 04/17/2018   HIV Non Reactive 02/22/2017                        Hematology Parameters Lab Results  Component Value Date   INR 1.12 09/11/2015   LABPROT 14.6 09/11/2015   APTT 31 09/11/2015   PLT 223 04/17/2018   HGB 13.5 04/17/2018   HCT  43.2 04/17/2018   DDIMER 3.85 (H) 10/05/2013                        CV Markers Lab Results  Component Value Date   TROPONINI <0.03 09/11/2015                         Note: Lab results reviewed.  Recent Imaging Results   Results for orders placed in visit on 05/15/18  DG C-Arm 1-60 Min-No Report   Narrative Fluoroscopy was utilized by the requesting physician.  No radiographic  interpretation.    Interpretation Report: Fluoroscopy was used during the procedure to assist with needle guidance. The images were interpreted intraoperatively by the requesting physician.  Meds   Current Outpatient Medications:  .  ACCU-CHEK AVIVA PLUS test strip, TEST 1 TIME DAILY **E11.9**, Disp: 200 each, Rfl: 2 .  ACCU-CHEK SOFTCLIX LANCETS lancets, Use to check blood sugar 1 time daily.  Dx: E11.42, Disp: 100 each, Rfl: 2 .  AMBULATORY NON FORMULARY MEDICATION, Medication Name: CPAP MASK OF CHOICE FOR HOME DEVICE, Disp: 1 each, Rfl: 0 .  aspirin 81 MG tablet, Take 81 mg by mouth daily., Disp: , Rfl:  .  buPROPion (WELLBUTRIN XL) 150 MG 24 hr tablet, Take 1 tablet (150 mg total) by mouth daily. MUST SCHEDULE ANNUAL EXAM, Disp: 90 tablet, Rfl: 3 .  Cinnamon 500 MG TABS, Take 1,000 mg by mouth daily., Disp: , Rfl:  .  Coenzyme Q10 (CO Q 10) 100 MG CAPS, Take 100 mg by mouth daily. , Disp: , Rfl:  .  glipiZIDE (GLUCOTROL XL) 2.5 MG 24 hr tablet, Take 1 tablet (2.5 mg total) by mouth daily with breakfast., Disp: 90 tablet, Rfl: 0 .  glucosamine-chondroitin 500-400 MG tablet, Take 2 tablets by mouth daily. , Disp: , Rfl:  .  Magnesium 250 MG TABS, Take 250 mg by mouth daily., Disp: , Rfl:  .  Misc Natural Products (TART CHERRY ADVANCED PO), Take 1,200 mg by mouth at bedtime. , Disp: , Rfl:  .  Multiple Vitamin (MULTIVITAMIN) tablet, Take 1 tablet by mouth daily., Disp: , Rfl:  .  naloxone (NARCAN) 2 MG/2ML injection, Inject 1 mL (1 mg total) into the muscle as needed (for opioid overdose). Inject content of  syringe into thigh muscle. Call 911., Disp: 2 Syringe, Rfl: 1 .  nitroGLYCERIN (NITROSTAT) 0.4 MG SL tablet, Place 1 tablet (0.4 mg total) under the tongue every 5 (five) minutes as needed for chest pain., Disp: 25 tablet, Rfl: prn .  NONFORMULARY OR COMPOUNDED ITEM, 147.95 mcg by Epidural Infusion route Continuous EPIDURAL. Medtronic Neuromodulation pump Fentanyl 147.95 mcg, Baclofen,  bupivicaine, Disp: , Rfl:  .  NONFORMULARY OR COMPOUNDED ITEM, 88.77 mcg by Epidural Infusion route Continuous EPIDURAL. Medtronic Neuromodulation pump: Fentanyl, baclofen 88.77, bupivicane, Disp: , Rfl:  .  NONFORMULARY OR COMPOUNDED ITEM, 5.918 mg by Epidural Infusion route Continuous EPIDURAL. Medtronic Neuromodulation pump: Fentanyl, Baclofen, Bupivicaine 5.918 mg, Disp: , Rfl:  .  Omega-3 Fatty Acids (FISH OIL PO), Take 1,600 mg by mouth 2 (two) times daily. , Disp: , Rfl:  .  [START ON 07/03/2018] oxyCODONE (OXY IR/ROXICODONE) 5 MG immediate release tablet, Take 1 tablet (5 mg total) by mouth every 8 (eight) hours as needed for severe pain., Disp: 90 tablet, Rfl: 0 .  oxyCODONE (OXY IR/ROXICODONE) 5 MG immediate release tablet, Take 1 tablet (5 mg total) by mouth every 8 (eight) hours as needed for severe pain., Disp: 90 tablet, Rfl: 0 .  PARoxetine (PAXIL) 20 MG tablet, TAKE ONE-HALF OF A TABLET BY MOUTH DAILY IN THE MORNING, Disp: 90 tablet, Rfl: 0 .  polyethylene glycol (MIRALAX / GLYCOLAX) packet, Take 17 g by mouth daily. , Disp: , Rfl:  .  pregabalin (LYRICA) 100 MG capsule, Take 1 capsule (100 mg total) by mouth 3 (three) times daily., Disp: 270 capsule, Rfl: 0 .  rosuvastatin (CRESTOR) 20 MG tablet, Take 1 tablet (20 mg total) by mouth daily., Disp: 30 tablet, Rfl: 11 .  tiZANidine (ZANAFLEX) 4 MG tablet, Take 1 tablet (4 mg total) by mouth 3 (three) times daily., Disp: 90 tablet, Rfl: 2 .  TURMERIC PO, Take 1 tablet by mouth 2 (two) times daily. , Disp: , Rfl:  .  vitamin E 400 UNIT capsule, Take 400 Units  by mouth daily., Disp: , Rfl:  .  oxyCODONE (OXY IR/ROXICODONE) 5 MG immediate release tablet, Take 1 tablet (5 mg total) by mouth every 8 (eight) hours as needed for severe pain., Disp: 90 tablet, Rfl: 0 .  Vitamin D, Ergocalciferol, (DRISDOL) 1.25 MG (50000 UT) CAPS capsule, TAKE 1 CAPSULE BY MOUTH EVERY 7 DAYS. (Patient not taking: Reported on 06/04/2018), Disp: 12 capsule, Rfl: 0  ROS  Constitutional: Denies any fever or chills Gastrointestinal: No reported hemesis, hematochezia, vomiting, or acute GI distress Musculoskeletal: Denies any acute onset joint swelling, redness, loss of ROM, or weakness Neurological: No reported episodes of acute onset apraxia, aphasia, dysarthria, agnosia, amnesia, paralysis, loss of coordination, or loss of consciousness  Allergies  Ms. Carone has No Known Allergies.  [place]  Drug: Ms. Kaminsky  reports that she does not use drugs. Alcohol:  reports that she drinks alcohol. Tobacco:  reports that she quit smoking about 10 years ago. Her smoking use included cigarettes. She has a 48.00 pack-year smoking history. She has never used smokeless tobacco. 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[place]) (05/30/2010), Amputation of second toe, right, traumatic ([place]) (09/2017), Anemia, Anxiety, Blue toes, Bulging disc, CAD (coronary artery disease) (2009), Cardiac arrhythmia due to congenital heart disease, Chicken pox, Chronic fatigue, Chronic kidney disease, Coronary arteritis, Degenerative disc disease, Depression, Diabetes mellitus without complication ([place]), Facet joint disease, Fibromyalgia, Heart disease, Hyperlipidemia, IBS (irritable bowel syndrome), MCL deficiency, knee, MRSA (methicillin resistant staph aureus) culture positive (2011), Neuropathy (04/18/2010), Peripheral neuropathy, Restless leg syndrome, Sepsis ([place]) (09/2013), Sleep apnea (08/17/2003), and Spinal stenosis. Surgical: Ms. Ismael  has a past surgical history  that includes Foot surgery (Right); Hand surgery (Left); Gallbladder surgery; Ablation; Ablation; HEART STENT (2009); HAND SURGEY (Left); Foot surgery (Bilateral); Foot surgery (Right); Infusion pump  implantation; Back surgery; Cholecystectomy (2003); Anterior cervical decomp/discectomy fusion (N/A, 07/28/2014); Coronary angioplasty; Carpal tunnel release (Right); Eye surgery (Bilateral, 2013); Amputation toe (Right, 02/01/2017); Irrigation and debridement foot (Right, 02/23/2017); Toe Surgery; Hammer toe surgery (Right, 10/17/2017); Intrathecal pump revision (N/A, 04/25/2018); and Intrathecal pump revision (Right, 04/25/2018). Family: family history includes Colon cancer in her paternal grandfather; Diabetes in her father and paternal grandmother; Heart disease in her maternal grandfather; Lung cancer in her mother.  Constitutional Exam  General appearance: Well nourished, well developed, and well hydrated. In no apparent acute distress Vitals:   06/04/18 1350  BP: (!) 133/95  Pulse: 74  Resp: 18  Temp: 98.7 F (37.1 C)  SpO2: 98%  Weight: 215 lb (97.5 kg)  Height: 5' 10" (1.778 m)   BMI Assessment: Estimated body mass index is 30.85 kg/m as calculated from the following:   Height as of this encounter: 5' 10" (1.778 m).   Weight as of this encounter: 215 lb (97.5 kg).  BMI interpretation table: BMI level Category Range association with higher incidence of chronic pain  <18 kg/m2 Underweight   18.5-24.9 kg/m2 Ideal body weight   25-29.9 kg/m2 Overweight Increased incidence by 20%  30-34.9 kg/m2 Obese (Class I) Increased incidence by 68%  35-39.9 kg/m2 Severe obesity (Class II) Increased incidence by 136%  >40 kg/m2 Extreme obesity (Class III) Increased incidence by 254%   Patient's current BMI Ideal Body weight  Body mass index is 30.85 kg/m. Ideal body weight: 68.5 kg (151 lb 0.2 oz) Adjusted ideal body weight: 80.1 kg (176 lb 9.7 oz)   BMI Readings from Last 4 Encounters:  06/04/18  30.85 kg/m  05/15/18 33.00 kg/m  05/13/18 33.00 kg/m  05/06/18 32.14 kg/m   Wt Readings from Last 4 Encounters:  06/04/18 215 lb (97.5 kg)  05/15/18 230 lb (104.3 kg)  05/13/18 230 lb (104.3 kg)  05/06/18 224 lb (101.6 kg)  Psych/Mental status: Alert, oriented x 3 (person, place, & time)       Eyes: PERLA Respiratory: No evidence of acute respiratory distress  Cervical Spine Area Exam  Skin & Axial Inspection: No masses, redness, edema, swelling, or associated skin lesions Alignment: Symmetrical Functional ROM: Unrestricted ROM      Stability: No instability detected Muscle Tone/Strength: Functionally intact. No obvious neuro-muscular anomalies detected. Sensory (Neurological): Unimpaired Palpation: No palpable anomalies              Upper Extremity (UE) Exam    Side: Right upper extremity  Side: Left upper extremity  Skin & Extremity Inspection: Skin color, temperature, and hair growth are WNL. No peripheral edema or cyanosis. No masses, redness, swelling, asymmetry, or associated skin lesions. No contractures.  Skin & Extremity Inspection: Skin color, temperature, and hair growth are WNL. No peripheral edema or cyanosis. No masses, redness, swelling, asymmetry, or associated skin lesions. No contractures.  Functional ROM: Unrestricted ROM          Functional ROM: Unrestricted ROM          Muscle Tone/Strength: Functionally intact. No obvious neuro-muscular anomalies detected.  Muscle Tone/Strength: Functionally intact. No obvious neuro-muscular anomalies detected.  Sensory (Neurological): Unimpaired          Sensory (Neurological): Unimpaired          Palpation: No palpable anomalies              Palpation: No palpable anomalies              Provocative Test(s):  Phalen's test:  deferred Tinel's test: deferred Apley's scratch test (touch opposite shoulder):  Action 1 (Across chest): deferred Action 2 (Overhead): deferred Action 3 (LB reach): deferred   Provocative Test(s):   Phalen's test: deferred Tinel's test: deferred Apley's scratch test (touch opposite shoulder):  Action 1 (Across chest): deferred Action 2 (Overhead): deferred Action 3 (LB reach): deferred    Thoracic Spine Area Exam  Skin & Axial Inspection: No masses, redness, or swelling Alignment: Symmetrical Functional ROM: Unrestricted ROM Stability: No instability detected Muscle Tone/Strength: Functionally intact. No obvious neuro-muscular anomalies detected. Sensory (Neurological): Unimpaired Muscle strength & Tone: No palpable anomalies  Lumbar Spine Area Exam  Skin & Axial Inspection: No masses, redness, or swelling Alignment: Symmetrical Functional ROM: Unrestricted ROM       Stability: No instability detected Muscle Tone/Strength: Functionally intact. No obvious neuro-muscular anomalies detected. Sensory (Neurological): Unimpaired Palpation: No palpable anomalies       Provocative Tests: Hyperextension/rotation test: deferred today       Lumbar quadrant test (Kemp's test): deferred today       Lateral bending test: deferred today       Patrick's Maneuver: deferred today                   FABER test: deferred today                   S-I anterior distraction/compression test: deferred today         S-I lateral compression test: deferred today         S-I Thigh-thrust test: deferred today         S-I Gaenslen's test: deferred today          Gait & Posture Assessment  Ambulation: Unassisted Gait: Relatively normal for age and body habitus Posture: WNL   Lower Extremity Exam    Side: Right lower extremity  Side: Left lower extremity  Stability: No instability observed          Stability: No instability observed          Skin & Extremity Inspection: Skin color, temperature, and hair growth are WNL. No peripheral edema or cyanosis. No masses, redness, swelling, asymmetry, or associated skin lesions. No contractures.  Skin & Extremity Inspection: Skin color, temperature, and hair  growth are WNL. No peripheral edema or cyanosis. No masses, redness, swelling, asymmetry, or associated skin lesions. No contractures.  Functional ROM: Unrestricted ROM                  Functional ROM: Unrestricted ROM                  Muscle Tone/Strength: Functionally intact. No obvious neuro-muscular anomalies detected.  Muscle Tone/Strength: Functionally intact. No obvious neuro-muscular anomalies detected.  Sensory (Neurological): Unimpaired        Sensory (Neurological): Unimpaired        DTR: Patellar: deferred today Achilles: deferred today Plantar: deferred today  DTR: Patellar: deferred today Achilles: deferred today Plantar: deferred today  Palpation: No palpable anomalies  Palpation: No palpable anomalies   Assessment  Primary Diagnosis & Pertinent Problem List: The primary encounter diagnosis was DDD (degenerative disc disease), lumbar. Diagnoses of Chronic low back pain (Bilateral) (L>R), Lumbar lateral recess stenosis, Lumbar spinal stenosis w/ neurogenic claudication, and Lumbar spondylosis were also pertinent to this visit.  Status Diagnosis  Controlled Controlled Controlled 1. DDD (degenerative disc disease), lumbar   2. Chronic low back pain (Bilateral) (L>R)   3. Lumbar lateral recess  stenosis   4. Lumbar spinal stenosis w/ neurogenic claudication   5. Lumbar spondylosis     Problems updated and reviewed during this visit: No problems updated. Plan of Care  Pharmacotherapy (Medications Ordered): No orders of the defined types were placed in this encounter.  Medications administered today: L[place] had no medications administered during this visit.   Procedure Orders     Lumbar Epidural Injection Lab Orders  No laboratory test(s) ordered today   Imaging Orders  No imaging studies ordered today   Referral Orders  No referral(s) requested today   Interventional management options: Planned, scheduled, and/or pending:   Intrathecal pump  management (refills and programming adjustments)   Considering:   Palliative/therapeutic intrathecal pump management (refills and programming adjustments)  Diagnostic left-sided L1 transforaminal ESI  Diagnostic left-sided L2 transforaminal ESI  Diagnostic right-sided L4 transforaminal ESI  Diagnostic right-sided L5 transforaminal ESI  Diagnostic (Midline) L1-2 LESI  Diagnostic (Midline) L2-3 LESI  Diagnostic (Midline) L3-4 LESI  Diagnostic (Midline) L4-5 LESI  Diagnostic left-sided L1-2 LESI  Diagnostic left-sided L2-3 LESI  Diagnostic left-sided L3-4 LESI  Diagnostic right-sided L3-4 LESI  Diagnostic right-sided L4-5 LESI  Diagnostic caudal ESI + diagnostic epidurogram  Possible Racz procedure  Diagnostic bilateral lumbar facet block  Possible bilateral lumbar facet RFA  Diagnostic bilateral intra-articular knee joint injection with local anesthetic and steroid  Diagnostic bilateral genicular nerve block  Possible bilateral genicular nerve RFA  Possible series of 5, bilateral, intra-articular [place] knee injection  Diagnostic cervical epidural steroid injection  Diagnostic bilateral cervical facet blocks  Possible bilateral cervical facet RFA    Palliative PRN treatment(s):   Palliative left-sided L2-3 interlaminar LESI #2 under fluoroscopic guidance and IV sedation   Provider-requested follow-up: Return for PRN Procedure.  Future Appointments  Date Time Provider Center  07/24/2018  1:45 PM Vevelyn Francois, NP [facility] None  08/06/2018  1:00 PM [name], MD [facility] None   Primary Care Physician: Jearld Fenton, NP Location: [facility] Outpatient Pain Management Facility Note by: Gaspar Cola, MD Date: 06/04/2018; Time: 2:39 PM

## 2018-06-04 NOTE — Progress Notes (Signed)
Safety precautions to be maintained throughout the outpatient stay will include: orient to surroundings, keep bed in low position, maintain call bell within reach at all times, provide assistance with transfer out of bed and ambulation.  

## 2018-06-04 NOTE — Patient Instructions (Signed)
____________________________________________________________________________________________  Pain Prevention Technique  Definition:   A technique used to minimize the effects of an activity known to cause inflammation or swelling, which in turn leads to an increase in pain.  Purpose: To prevent swelling from occurring. It is based on the fact that it is easier to prevent swelling from happening than it is to get rid of it, once it occurs.  Contraindications: 1. Anyone with allergy or hypersensitivity to the recommended medications. 2. Anyone taking anticoagulants (Blood Thinners) (e.g., Coumadin, Warfarin, Plavix, etc.). 3. Patients in Renal Failure.  Technique: Before you undertake an activity known to cause pain, or a flare-up of your chronic pain, and before you experience any pain, do the following:  1. On a full stomach, take 4 (four) over the counter Ibuprofens 200mg tablets (Motrin), for a total of 800 mg. 2. In addition, take over the counter Magnesium 400 to 500 mg, before doing the activity.  3. Six (6) hours later, again on a full stomach, repeat the Ibuprofen. 4. That night, take a warm shower and stretch under the running warm water.  This technique may be sufficient to abort the pain and discomfort before it happens. Keep in mind that it takes a lot less medication to prevent swelling than it takes to eliminate it once it occurs.  ____________________________________________________________________________________________   

## 2018-06-05 ENCOUNTER — Ambulatory Visit: Payer: Medicare Other | Admitting: Internal Medicine

## 2018-06-09 DIAGNOSIS — M62838 Other muscle spasm: Secondary | ICD-10-CM | POA: Diagnosis not present

## 2018-06-09 DIAGNOSIS — M9906 Segmental and somatic dysfunction of lower extremity: Secondary | ICD-10-CM | POA: Diagnosis not present

## 2018-06-09 DIAGNOSIS — M9903 Segmental and somatic dysfunction of lumbar region: Secondary | ICD-10-CM | POA: Diagnosis not present

## 2018-06-09 DIAGNOSIS — M5032 Other cervical disc degeneration, mid-cervical region, unspecified level: Secondary | ICD-10-CM | POA: Diagnosis not present

## 2018-06-09 DIAGNOSIS — M9901 Segmental and somatic dysfunction of cervical region: Secondary | ICD-10-CM | POA: Diagnosis not present

## 2018-06-09 DIAGNOSIS — M5136 Other intervertebral disc degeneration, lumbar region: Secondary | ICD-10-CM | POA: Diagnosis not present

## 2018-06-16 ENCOUNTER — Telehealth: Payer: Self-pay | Admitting: Nurse Practitioner

## 2018-06-16 DIAGNOSIS — M62838 Other muscle spasm: Secondary | ICD-10-CM | POA: Diagnosis not present

## 2018-06-16 DIAGNOSIS — M9903 Segmental and somatic dysfunction of lumbar region: Secondary | ICD-10-CM | POA: Diagnosis not present

## 2018-06-16 DIAGNOSIS — M9901 Segmental and somatic dysfunction of cervical region: Secondary | ICD-10-CM | POA: Diagnosis not present

## 2018-06-16 DIAGNOSIS — M5032 Other cervical disc degeneration, mid-cervical region, unspecified level: Secondary | ICD-10-CM | POA: Diagnosis not present

## 2018-06-16 DIAGNOSIS — M9906 Segmental and somatic dysfunction of lower extremity: Secondary | ICD-10-CM | POA: Diagnosis not present

## 2018-06-16 DIAGNOSIS — M5136 Other intervertebral disc degeneration, lumbar region: Secondary | ICD-10-CM | POA: Diagnosis not present

## 2018-06-16 NOTE — Telephone Encounter (Signed)
Pt wanted Melinda [name] to call her back again she had another question about her prescription

## 2018-06-16 NOTE — Telephone Encounter (Signed)
[name], are you willing to send her script for Oxycodone to the pharmacy in [facility]? I can cancel the one that has already been sent to her pharmacy here. Let me know, I have not put the pharmacy info into her chart.  [facility] [phone] [place]  [address]

## 2018-06-16 NOTE — Telephone Encounter (Signed)
[place] will not fill this so I will not be sending thank you

## 2018-06-16 NOTE — Telephone Encounter (Signed)
Advised that most pharmacies wont fill scripts for opioids out of state. Pt. Will call the pharmacy in [place] to ask if they will fill it.

## 2018-06-16 NOTE — Telephone Encounter (Signed)
She called the pharmacy and they told her they would fill it, that they would have to call us to verify it.

## 2018-06-16 NOTE — Telephone Encounter (Signed)
I will have to get this cleared

## 2018-06-16 NOTE — Telephone Encounter (Signed)
Pt called and said that she will be going on vacation to [place] in January when he medication is due to be refilled. She wanted a nurse to give her a call back so she could give them the pharmacy information for down there so the rx here could be cancelled and a new one could be sent in there for January.

## 2018-06-16 NOTE — Telephone Encounter (Signed)
Please advise 

## 2018-06-17 ENCOUNTER — Telehealth: Payer: Self-pay

## 2018-06-17 NOTE — Telephone Encounter (Signed)
Called patient and informed her that Rx will be sent to [facility]  Per [name] after talking with [name] and Drue Dun

## 2018-06-17 NOTE — Telephone Encounter (Signed)
I spoke with Dr. Dossie Arbour, he said that because she has been a patient here for many years, and has not been the cause of any problems, it is ok to send her prescription to [facility].

## 2018-06-18 ENCOUNTER — Encounter: Payer: Self-pay | Admitting: Family Medicine

## 2018-06-18 ENCOUNTER — Ambulatory Visit (INDEPENDENT_AMBULATORY_CARE_PROVIDER_SITE_OTHER): Payer: Medicare Other | Admitting: Family Medicine

## 2018-06-18 ENCOUNTER — Ambulatory Visit: Payer: Self-pay

## 2018-06-18 VITALS — BP 90/58 | HR 75 | Ht 70.0 in | Wt 215.0 lb

## 2018-06-18 DIAGNOSIS — M170 Bilateral primary osteoarthritis of knee: Secondary | ICD-10-CM

## 2018-06-18 DIAGNOSIS — I208 Other forms of angina pectoris: Secondary | ICD-10-CM | POA: Diagnosis not present

## 2018-06-18 DIAGNOSIS — M7552 Bursitis of left shoulder: Secondary | ICD-10-CM | POA: Insufficient documentation

## 2018-06-18 DIAGNOSIS — M25512 Pain in left shoulder: Secondary | ICD-10-CM | POA: Diagnosis not present

## 2018-06-18 NOTE — Patient Instructions (Signed)
Good see you  Ice is your friend Exercises 3 times a week.  Keep hands within peripheral vision  Keep your better half off your arm when sleeping ;) Injected knee  See me again in 5 weeks or so to make sure doing better

## 2018-06-18 NOTE — Telephone Encounter (Signed)
Yes, Melinda [name] told me herself that she is due on 07-13-18. She is not in [place] now, but will be at that time.

## 2018-06-18 NOTE — Assessment & Plan Note (Signed)
Right knee injected today.  Patient has chronic pain and many other comorbidities.  Hopefully this will be beneficial for her.  Continue to wear the brace.  Discussed icing regimen and home exercises.  Discussed which activities to do which wants to avoid.  Encouraged topical anti-inflammatories.  Follow-up with me again in 4 weeks.  Could be a candidate for Visco supplementation.

## 2018-06-18 NOTE — Progress Notes (Signed)
[name] [address], [place] [address] Phone: [phone] Subjective:   [name], am serving as a scribe for Dr. Hulan Saas.   CC: Shoulder pain and knee pain  KNL:[id number]  Melinda [name] is a 62 y.o. female coming in with complaint of left shoulder and right knee pain. Shoulder pain began 2 weeks ago. Patient recalls having her shoulder over head and having her wife lay on it. Pain began following this incident. Pain anterior shoulder that radiates into the bicep. Denies any radiating symptoms further than the bicep. Patient is leaving for a kayaking trip on January 8th and is going to try to metal detect while she is in [facility]. Pain is worse in the morning and when picking up heavier items.   Patient is also having right knee pain. Has been increasing recently. Pain on medial aspect.  Patient has known arthritic changes of the knees.  Moderate to severe patient states that it is worsening.  Started having increasing instability.  Currently has been wearing the brace but still more pain than usual with some increasing swelling      Past Medical History:  Diagnosis Date  . 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 Amputation of second toe, right, traumatic ([place]) 09/2017  . Anemia    years ago  . Anxiety   . Blue toes    2nd toe on right foot, will get appt.  . Bulging disc   . CAD (coronary artery disease) 2009   s/p stent to LAD  . 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  Ablations done.  Now has rare episodes  . Chicken pox   . Chronic fatigue   . Chronic kidney disease    "problem with kidney filtration"  . Coronary arteritis   . Degenerative disc disease   . Depression   . Diabetes mellitus without complication ([place])   . Facet joint disease   . Fibromyalgia   . Heart disease   . Hyperlipidemia   . IBS (irritable bowel syndrome)   . MCL deficiency, knee   . MRSA (methicillin  resistant staph aureus) culture positive 2011   GREAT TOE RIGHT FOOT  . Neuropathy 04/18/2010  . Peripheral neuropathy   . Restless leg syndrome   . Sepsis ([place]) 09/2013  . Sleep apnea 08/17/2003   uses CPAP, sleep study at [facility] (mild to moderate)  . Spinal stenosis    Past Surgical History:  Procedure Laterality Date  . ABLATION     UTERUS  . ABLATION     HEART  . AMPUTATION TOE Right 02/01/2017   Procedure: AMPUTATION TOE-RIGHT 2ND MPJ;  Surgeon: Albertine Patricia, DPM;  Location: [facility];  Service: Podiatry;  Laterality: Right;  . ANTERIOR CERVICAL DECOMP/DISCECTOMY FUSION N/A 07/28/2014   Procedure: ANTERIOR CERVICAL DECOMPRESSION/DISCECTOMY FUSION CERVICAL 3-4,4-5,5-6 LEVELS WITH INSTRUMENTATION AND ALLOGRAFT;  Surgeon: Sinclair Ship, MD;  Location: [place];  Service: Orthopedics;  Laterality: N/A;  Anterior cervical decompression fusion, cervical 3-4, cervical 4-5, cervical 5-6 with instrumentation and allograft  . BACK SURGERY     X 3 1979, 1994, 1995  . CARPAL TUNNEL RELEASE Right   . CHOLECYSTECTOMY  2003  . CORONARY ANGIOPLASTY    . EYE SURGERY Bilateral 2013   Eyelid lift   . FOOT SURGERY Right    BIG TOE  . FOOT SURGERY Bilateral    PLANTAR FASCIITIS  . FOOT SURGERY Right  2ND TOE  . GALLBLADDER SURGERY    . HAMMER TOE SURGERY Right 10/17/2017   Procedure: HAMMER TOE CORRECTION-4TH TOE;  Surgeon: Albertine Patricia, DPM;  Location: [place];  Service: Podiatry;  Laterality: Right;  LMA- WITH LOCAL Diabetic - diet controlled  . HAND SURGERY Left   . HAND SURGEY Left   . HEART STENT  2009   LAD  . INFUSION PUMP IMPLANTATION     X2 with morphine and baclofen  . INTRATHECAL PUMP REVISION N/A 04/25/2018   Procedure: Intrathecal pump replacement;  Surgeon: Clydell Hakim, MD;  Location: [place];  Service: Neurosurgery;  Laterality: N/A;  right  . INTRATHECAL PUMP REVISION Right 04/25/2018  . IRRIGATION AND DEBRIDEMENT FOOT Right 02/23/2017    Procedure: IRRIGATION AND DEBRIDEMENT FOOT-right foot;  Surgeon: Samara Deist, DPM;  Location: [facility];  Service: Podiatry;  Laterality: Right;  . TOE SURGERY     then revision 8/18   Social History   Socioeconomic History  . Marital status: Significant Other    Spouse name: Not on file  . Number of children: Not on file  . Years of education: Not on file  . Highest education level: Not on file  Occupational History  . Not on file  Social Needs  . Financial resource strain: Not on file  . Food insecurity:    Worry: Not on file    Inability: Not on file  . Transportation needs:    Medical: Not on file    Non-medical: Not on file  Tobacco Use  . Smoking status: Former Smoker    Packs/day: 1.50    Years: 32.00    Pack years: 48.00    Types: Cigarettes    Last attempt to quit: 07/22/2007    Years since quitting: 10.9  . Smokeless tobacco: Never Used  Substance and Sexual Activity  . Alcohol use: Yes    Alcohol/week: 0.0 standard drinks    Comment: occ - Holidays  . Drug use: No  . Sexual activity: Yes  Lifestyle  . Physical activity:    Days per week: Not on file    Minutes per session: Not on file  . Stress: Not on file  Relationships  . Social connections:    Talks on phone: Not on file    Gets together: Not on file    Attends religious service: Not on file    Active member of club or organization: Not on file    Attends meetings of clubs or organizations: Not on file    Relationship status: Not on file  Other Topics Concern  . Not on file  Social History Narrative   Lives at home with a partner. Independent at baseline   No Known Allergies Family History  Problem Relation Age of Onset  . Lung cancer Mother   . Diabetes Father   . Heart disease Maternal Grandfather   . Diabetes Paternal Grandmother   . Colon cancer Paternal Grandfather   . Stroke Neg Hx     Current Outpatient Medications (Endocrine & Metabolic):  .  glipiZIDE (GLUCOTROL XL) 2.5 MG 24  hr tablet, Take 1 tablet (2.5 mg total) by mouth daily with breakfast.  Current Outpatient Medications (Cardiovascular):  .  nitroGLYCERIN (NITROSTAT) 0.4 MG SL tablet, Place 1 tablet (0.4 mg total) under the tongue every 5 (five) minutes as needed for chest pain. .  rosuvastatin (CRESTOR) 20 MG tablet, Take 1 tablet (20 mg total) by mouth daily.   Current Outpatient Medications (  Analgesics):  .  aspirin 81 MG tablet, Take 81 mg by mouth daily. Derrill Memo ON 07/03/2018] oxyCODONE (OXY IR/ROXICODONE) 5 MG immediate release tablet, Take 1 tablet (5 mg total) by mouth every 8 (eight) hours as needed for severe pain. Marland Kitchen  oxyCODONE (OXY IR/ROXICODONE) 5 MG immediate release tablet, Take 1 tablet (5 mg total) by mouth every 8 (eight) hours as needed for severe pain. Marland Kitchen  oxyCODONE (OXY IR/ROXICODONE) 5 MG immediate release tablet, Take 1 tablet (5 mg total) by mouth every 8 (eight) hours as needed for severe pain.   Current Outpatient Medications (Other):  Marland Kitchen  ACCU-CHEK AVIVA PLUS test strip, TEST 1 TIME DAILY **E11.9** .  ACCU-CHEK SOFTCLIX LANCETS lancets, Use to check blood sugar 1 time daily.  Dx: E11.42 .  AMBULATORY NON FORMULARY MEDICATION, Medication Name: CPAP MASK OF CHOICE FOR HOME DEVICE .  buPROPion (WELLBUTRIN XL) 150 MG 24 hr tablet, Take 1 tablet (150 mg total) by mouth daily. MUST SCHEDULE ANNUAL EXAM .  Cinnamon 500 MG TABS, Take 1,000 mg by mouth daily. .  Coenzyme Q10 (CO Q 10) 100 MG CAPS, Take 100 mg by mouth daily.  Marland Kitchen  glucosamine-chondroitin 500-400 MG tablet, Take 2 tablets by mouth daily.  .  Magnesium 250 MG TABS, Take 250 mg by mouth daily. .  Misc Natural Products (TART CHERRY ADVANCED PO), Take 1,200 mg by mouth at bedtime.  .  Multiple Vitamin (MULTIVITAMIN) tablet, Take 1 tablet by mouth daily. .  naloxone (NARCAN) 2 MG/2ML injection, Inject 1 mL (1 mg total) into the muscle as needed (for opioid overdose). Inject content of syringe into thigh muscle. Call 911. .   NONFORMULARY OR COMPOUNDED ITEM, 147.95 mcg by Epidural Infusion route Continuous EPIDURAL. Medtronic Neuromodulation pump Fentanyl 147.95 mcg, Baclofen, bupivicaine .  NONFORMULARY OR COMPOUNDED ITEM, 88.77 mcg by Epidural Infusion route Continuous EPIDURAL. Medtronic Neuromodulation pump: Fentanyl, baclofen 88.77, bupivicane .  NONFORMULARY OR COMPOUNDED ITEM, 5.918 mg by Epidural Infusion route Continuous EPIDURAL. Medtronic Neuromodulation pump: Fentanyl, Baclofen, Bupivicaine 5.918 mg .  Omega-3 Fatty Acids (FISH OIL PO), Take 1,600 mg by mouth 2 (two) times daily.  Marland Kitchen  PARoxetine (PAXIL) 20 MG tablet, TAKE ONE-HALF OF A TABLET BY MOUTH DAILY IN THE MORNING .  polyethylene glycol (MIRALAX / GLYCOLAX) packet, Take 17 g by mouth daily.  .  pregabalin (LYRICA) 100 MG capsule, Take 1 capsule (100 mg total) by mouth 3 (three) times daily. Marland Kitchen  tiZANidine (ZANAFLEX) 4 MG tablet, Take 1 tablet (4 mg total) by mouth 3 (three) times daily. .  TURMERIC PO, Take 1 tablet by mouth 2 (two) times daily.  .  Vitamin D, Ergocalciferol, (DRISDOL) 1.25 MG (50000 UT) CAPS capsule, TAKE 1 CAPSULE BY MOUTH EVERY 7 DAYS. Marland Kitchen  vitamin E 400 UNIT capsule, Take 400 Units by mouth daily.    Past medical history, social, surgical and family history all reviewed in electronic medical record.  No pertanent information unless stated regarding to the chief complaint.   Review of Systems:  No headache, visual changes, nausea, vomiting, diarrhea, constipation, dizziness, abdominal pain, skin rash, fevers, chills, night sweats, weight loss, swollen lymph nodes,  chest pain, shortness of breath, mood changes.  Positive body aches, muscle aches, joint swelling  Objective  Blood pressure (!) 90/58, pulse 75, height 5\' 10"  (1.778 m), weight 215 lb (97.5 kg), SpO2 97 %.   General: No apparent distress alert and oriented x3 mood and affect normal, dressed appropriately.  HEENT: Pupils  equal, extraocular movements intact    Respiratory: Patient's speak in full sentences and does not appear short of breath  Cardiovascular: No lower extremity edema, non tender, no erythema  Skin: Warm dry intact with no signs of infection or rash on extremities or on axial skeleton.  Abdomen: Soft nontender  Neuro: Cranial nerves II through XII are intact, neurovascularly intact in all extremities with 2+ DTRs and 2+ pulses.  Lymph: No lymphadenopathy of posterior or anterior cervical chain or axillae bilaterally.  Gait antalgic MSK:  Non tender with full range of motion and good stability and symmetric strength and tone of  elbows, wrist, hip, and ankles bilaterally.  Knee: Bilateral valgus deformity noted.  Abnormal thigh to calf ratio.  Tender to palpation over medial and PF joint line.  ROM full in flexion and extension and lower leg rotation. instability with valgus force.  painful patellar compression.  Shoulder: left Inspection reveals no abnormalities, atrophy or asymmetry. Palpation is normal with no tenderness over AC joint or bicipital groove. ROM is full in all planes passively. Rotator cuff strength normal throughout. signs of impingement with positive Neer and Hawkin's tests, but negative empty can sign. Speeds and Yergason's tests normal. No labral pathology noted with negative Obrien's, negative clunk and good stability. Normal scapular function observed. No painful arc and no drop arm sign. No apprehension sign  MSK US performed of: left This study was ordered, performed, and interpreted by Charlann Boxer D.O.  Shoulder:   Supraspinatus:  Appears normal on long and transverse views, Bursal bulge seen with shoulder abduction on impingement view. Infraspinatus:  Appears normal on long and transverse views. Significant increase in Doppler flow Subscapularis:  Appears normal on long and transverse views. Positive bursa Teres Minor:  Appears normal on long and transverse views. AC joint:  Capsule undistended,  no geyser sign. Glenohumeral Joint:  Appears normal without effusion. Glenoid Labrum:  Intact without visualized tears. Biceps Tendon:  Appears normal on long and transverse views, no fraying of tendon, tendon located in intertubercular groove, no subluxation with shoulder internal or external rotation.  Impression: Subacromial bursitis  Procedure: Real-time Ultrasound Guided Injection of left glenohumeral joint Device: GE Logiq E  Ultrasound guided injection is preferred based studies that show increased duration, increased effect, greater accuracy, decreased procedural pain, increased response rate with ultrasound guided versus blind injection.  Verbal informed consent obtained.  Time-out conducted.  Noted no overlying erythema, induration, or other signs of local infection.  Skin prepped in a sterile fashion.  Local anesthesia: Topical Ethyl chloride.  With sterile technique and under real time ultrasound guidance:  Joint visualized.  23g 1  inch needle inserted posterior approach. Pictures taken for needle placement. Patient did have injection of 2 cc of 1% lidocaine, 2 cc of 0.5% Marcaine, and 1.0 cc of Kenalog 40 mg/dL. Completed without difficulty  Pain immediately resolved suggesting accurate placement of the medication.  Advised to call if fevers/chills, erythema, induration, drainage, or persistent bleeding.  Images permanently stored and available for review in the ultrasound unit.  Impression: Technically successful ultrasound guided injection.  After informed written and verbal consent, patient was seated on exam table. Right knee was prepped with alcohol swab and utilizing anterolateral approach, patient's right knee space was injected with 4:1  marcaine 0.5%: Kenalog 40mg /dL. Patient tolerated the procedure well without immediate complications.   Impression and Recommendations:     This case required medical decision making of moderate complexity. The above documentation  has been reviewed  and is accurate and complete Lyndal Pulley, DO       Note: This dictation was prepared with Dragon dictation along with smaller phrase technology. Any transcriptional errors that result from this process are unintentional.

## 2018-06-18 NOTE — Telephone Encounter (Signed)
Ok so according to the PMP her Oxycodone was filled on 06/13/18 30 day supply.  She can not get another refill until 07/13/18. So please clarify what it is that she needs because unless she is staying in [place] until 07/13/18 then she does not need to have this sent to another pharmacy.

## 2018-06-18 NOTE — Telephone Encounter (Signed)
I spoke with [name], she will e-scribe it to [facility], but will wait until closer to time to fill it. Patient notified.

## 2018-06-18 NOTE — Telephone Encounter (Signed)
Ok so I don't always get the entire message. So is she really wanting me to send this 3 weeks in advance. Anything can happen between now and then.

## 2018-06-18 NOTE — Assessment & Plan Note (Signed)
Injection given today.  Discussed icing regimen and home exercise.  Discussed which activities to doing which would avoid.  Increase activity slowly.  Follow-up with me again in 4 to 6 weeks.

## 2018-06-23 DIAGNOSIS — M9901 Segmental and somatic dysfunction of cervical region: Secondary | ICD-10-CM | POA: Diagnosis not present

## 2018-06-23 DIAGNOSIS — M5136 Other intervertebral disc degeneration, lumbar region: Secondary | ICD-10-CM | POA: Diagnosis not present

## 2018-06-23 DIAGNOSIS — M9903 Segmental and somatic dysfunction of lumbar region: Secondary | ICD-10-CM | POA: Diagnosis not present

## 2018-06-23 DIAGNOSIS — M9906 Segmental and somatic dysfunction of lower extremity: Secondary | ICD-10-CM | POA: Diagnosis not present

## 2018-06-23 DIAGNOSIS — M5032 Other cervical disc degeneration, mid-cervical region, unspecified level: Secondary | ICD-10-CM | POA: Diagnosis not present

## 2018-06-23 DIAGNOSIS — M62838 Other muscle spasm: Secondary | ICD-10-CM | POA: Diagnosis not present

## 2018-06-30 DIAGNOSIS — M62838 Other muscle spasm: Secondary | ICD-10-CM | POA: Diagnosis not present

## 2018-06-30 DIAGNOSIS — M9903 Segmental and somatic dysfunction of lumbar region: Secondary | ICD-10-CM | POA: Diagnosis not present

## 2018-06-30 DIAGNOSIS — M9901 Segmental and somatic dysfunction of cervical region: Secondary | ICD-10-CM | POA: Diagnosis not present

## 2018-06-30 DIAGNOSIS — M9906 Segmental and somatic dysfunction of lower extremity: Secondary | ICD-10-CM | POA: Diagnosis not present

## 2018-06-30 DIAGNOSIS — M5136 Other intervertebral disc degeneration, lumbar region: Secondary | ICD-10-CM | POA: Diagnosis not present

## 2018-06-30 DIAGNOSIS — M5032 Other cervical disc degeneration, mid-cervical region, unspecified level: Secondary | ICD-10-CM | POA: Diagnosis not present

## 2018-07-01 ENCOUNTER — Other Ambulatory Visit: Payer: Self-pay | Admitting: Internal Medicine

## 2018-07-04 ENCOUNTER — Encounter: Payer: Self-pay | Admitting: Internal Medicine

## 2018-07-07 ENCOUNTER — Other Ambulatory Visit: Payer: Self-pay | Admitting: Nurse Practitioner

## 2018-07-07 ENCOUNTER — Telehealth: Payer: Self-pay | Admitting: Nurse Practitioner

## 2018-07-07 DIAGNOSIS — G894 Chronic pain syndrome: Secondary | ICD-10-CM

## 2018-07-07 MED ORDER — OXYCODONE HCL 5 MG PO TABS: 5.0000 mg | ORAL_TABLET | Freq: Three times a day (TID) | ORAL | 0 refills | Status: DC | PRN

## 2018-07-07 NOTE — Telephone Encounter (Signed)
Pt left voicemail stating that she would like [name] to return her call about her prescription that she needs sent to a pharmacy in [place]. She stated that she is leaving in 2 days and that they have already spoken about this before and that [name] should know what she is talking about.

## 2018-07-07 NOTE — Telephone Encounter (Signed)
[name], will you go ahead and send it. I will call her local pharmacy to discontinue the current one when you message me back.

## 2018-07-23 ENCOUNTER — Encounter: Payer: Medicare Other | Admitting: Nurse Practitioner

## 2018-07-24 ENCOUNTER — Encounter: Payer: Self-pay | Admitting: Nurse Practitioner

## 2018-07-24 ENCOUNTER — Other Ambulatory Visit: Payer: Self-pay

## 2018-07-24 ENCOUNTER — Ambulatory Visit: Payer: Medicare Other | Attending: Nurse Practitioner | Admitting: Nurse Practitioner

## 2018-07-24 VITALS — BP 122/71 | HR 65 | Temp 98.3°F | Ht 70.0 in | Wt 220.0 lb

## 2018-07-24 DIAGNOSIS — Z79891 Long term (current) use of opiate analgesic: Secondary | ICD-10-CM | POA: Diagnosis not present

## 2018-07-24 DIAGNOSIS — M47812 Spondylosis without myelopathy or radiculopathy, cervical region: Secondary | ICD-10-CM | POA: Diagnosis not present

## 2018-07-24 DIAGNOSIS — M5136 Other intervertebral disc degeneration, lumbar region: Secondary | ICD-10-CM | POA: Insufficient documentation

## 2018-07-24 DIAGNOSIS — G894 Chronic pain syndrome: Secondary | ICD-10-CM

## 2018-07-24 DIAGNOSIS — M51369 Other intervertebral disc degeneration, lumbar region without mention of lumbar back pain or lower extremity pain: Secondary | ICD-10-CM

## 2018-07-24 DIAGNOSIS — M797 Fibromyalgia: Secondary | ICD-10-CM

## 2018-07-24 MED ORDER — TIZANIDINE HCL 4 MG PO TABS: 4.0000 mg | ORAL_TABLET | Freq: Three times a day (TID) | ORAL | 2 refills | Status: DC

## 2018-07-24 MED ORDER — OXYCODONE HCL 5 MG PO TABS: 5.0000 mg | ORAL_TABLET | Freq: Three times a day (TID) | ORAL | 0 refills | Status: DC | PRN

## 2018-07-24 MED ORDER — NALOXONE HCL 2 MG/2ML IJ SOSY: 1.0000 mg | PREFILLED_SYRINGE | INTRAMUSCULAR | 1 refills | Status: DC | PRN

## 2018-07-24 MED ORDER — PREGABALIN 100 MG PO CAPS: 100.0000 mg | ORAL_CAPSULE | Freq: Three times a day (TID) | ORAL | 0 refills | Status: DC

## 2018-07-24 NOTE — Progress Notes (Signed)
Nursing Pain Medication Assessment:  Safety precautions to be maintained throughout the outpatient stay will include: orient to surroundings, keep bed in low position, maintain call bell within reach at all times, provide assistance with transfer out of bed and ambulation.  Medication Inspection Compliance: Pill count conducted under aseptic conditions, in front of the patient. Neither the pills nor the bottle was removed from the patient's sight at any time. Once count was completed pills were immediately returned to the patient in their original bottle.  Medication: Oxycodone IR Pill/Patch Count: 60 of 90 pills remain Pill/Patch Appearance: Markings consistent with prescribed medication Bottle Appearance: Standard pharmacy container. Clearly labeled. Filled Date: 1 / 80 / 2020 Last Medication intake:  Today

## 2018-07-24 NOTE — Patient Instructions (Signed)
____________________________________________________________________________________________  Medication Rules  Purpose: To inform patients, and their family members, of our rules and regulations.  Applies to: All patients receiving prescriptions (written or electronic).  Pharmacy of record: Pharmacy where electronic prescriptions will be sent. If written prescriptions are taken to a different pharmacy, please inform the nursing staff. The pharmacy listed in the electronic medical record should be the one where you would like electronic prescriptions to be sent.  Electronic prescriptions: In compliance with the [place] Strengthen Opioid Misuse Prevention (STOP) Act of 2017 (Session Law 2017-74/H243), effective July 02, 2018, all controlled substances must be electronically prescribed. Calling prescriptions to the pharmacy will cease to exist.  Prescription refills: Only during scheduled appointments. Applies to all prescriptions.  NOTE: The following applies primarily to controlled substances (Opioid* Pain Medications).   Patient's responsibilities: 1. Pain Pills: Bring all pain pills to every appointment (except for procedure appointments). 2. Pill Bottles: Bring pills in original pharmacy bottle. Always bring the newest bottle. Bring bottle, even if empty. 3. Medication refills: You are responsible for knowing and keeping track of what medications you take and those you need refilled. The day before your appointment: write a list of all prescriptions that need to be refilled. The day of the appointment: give the list to the admitting nurse. Prescriptions will be written only during appointments. If you forget a medication: it will not be "Called in", "Faxed", or "electronically sent". You will need to get another appointment to get these prescribed. No early refills. Do not call asking to have your prescription filled early. 4. Prescription Accuracy: You are responsible for  carefully inspecting your prescriptions before leaving our office. Have the discharge nurse carefully go over each prescription with you, before taking them home. Make sure that your name is accurately spelled, that your address is correct. Check the name and dose of your medication to make sure it is accurate. Check the number of pills, and the written instructions to make sure they are clear and accurate. Make sure that you are given enough medication to last until your next medication refill appointment. 5. Taking Medication: Take medication as prescribed. When it comes to controlled substances, taking less pills or less frequently than prescribed is permitted and encouraged. Never take more pills than instructed. Never take medication more frequently than prescribed.  6. Inform other Doctors: Always inform, all of your healthcare providers, of all the medications you take. 7. Pain Medication from other Providers: You are not allowed to accept any additional pain medication from any other Doctor or Healthcare provider. There are two exceptions to this rule. (see below) In the event that you require additional pain medication, you are responsible for notifying us, as stated below. 8. Medication Agreement: You are responsible for carefully reading and following our Medication Agreement. This must be signed before receiving any prescriptions from our practice. Safely store a copy of your signed Agreement. Violations to the Agreement will result in no further prescriptions. (Additional copies of our Medication Agreement are available upon request.) 9. Laws, Rules, & Regulations: All patients are expected to follow all Federal and State Laws, Statutes, Rules, & Regulations. Ignorance of the Laws does not constitute a valid excuse. The use of any illegal substances is prohibited. 10. Adopted CDC guidelines & recommendations: Target dosing levels will be at or below 60 MME/day. Use of benzodiazepines** is not  recommended.  Exceptions: There are only two exceptions to the rule of not receiving pain medications from other Healthcare Providers. 1.   Exception #1 (Emergencies): In the event of an emergency (i.e.: accident requiring emergency care), you are allowed to receive additional pain medication. However, you are responsible for: As soon as you are able, call our office [phone], at any time of the day or night, and leave a message stating your name, the date and nature of the emergency, and the name and dose of the medication prescribed. In the event that your call is answered by a member of our staff, make sure to document and save the date, time, and the name of the person that took your information.  2. Exception #2 (Planned Surgery): In the event that you are scheduled by another doctor or dentist to have any type of surgery or procedure, you are allowed (for a period no longer than 30 days), to receive additional pain medication, for the acute post-op pain. However, in this case, you are responsible for picking up a copy of our "Post-op Pain Management for Surgeons" handout, and giving it to your surgeon or dentist. This document is available at our office, and does not require an appointment to obtain it. Simply go to our office during business hours (Monday-Thursday from 8:00 AM to 4:00 PM) (Friday 8:00 AM to 12:00 Noon) or if you have a scheduled appointment with [place], prior to your surgery, and ask for it by name. In addition, you will need to provide [place] with your name, name of your surgeon, type of surgery, and date of procedure or surgery.  *Opioid medications include: morphine, codeine, oxycodone, oxymorphone, hydrocodone, hydromorphone, meperidine, tramadol, tapentadol, buprenorphine, fentanyl, methadone. **Benzodiazepine medications include: diazepam (Valium), alprazolam (Xanax), clonazepam (Klonopine), lorazepam (Ativan), clorazepate (Tranxene), chlordiazepoxide (Librium), estazolam (Prosom),  oxazepam (Serax), temazepam (Restoril), triazolam (Halcion) (Last updated: 08/29/2017) ____________________________________________________________________________________________   BMI Assessment: Estimated body mass index is 31.57 kg/m as calculated from the following:   Height as of this encounter: 5\' 10"  (1.778 m).   Weight as of this encounter: 220 lb (99.8 kg).  BMI interpretation table: BMI level Category Range association with higher incidence of chronic pain  <18 kg/m2 Underweight   18.5-24.9 kg/m2 Ideal body weight   25-29.9 kg/m2 Overweight Increased incidence by 20%  30-34.9 kg/m2 Obese (Class I) Increased incidence by 68%  35-39.9 kg/m2 Severe obesity (Class II) Increased incidence by 136%  >40 kg/m2 Extreme obesity (Class III) Increased incidence by 254%   Patient's current BMI Ideal Body weight  Body mass index is 31.57 kg/m. Ideal body weight: 68.5 kg (151 lb 0.2 oz) Adjusted ideal body weight: 81 kg (178 lb 9.7 oz)   BMI Readings from Last 4 Encounters:  07/24/18 31.57 kg/m  06/18/18 30.85 kg/m  06/04/18 30.85 kg/m  05/15/18 33.00 kg/m   Wt Readings from Last 4 Encounters:  07/24/18 220 lb (99.8 kg)  06/18/18 215 lb (97.5 kg)  06/04/18 215 lb (97.5 kg)  05/15/18 230 lb (104.3 kg)

## 2018-07-24 NOTE — Progress Notes (Signed)
Patient's Name: Melinda [name]  MRN: [id number]  Referring Provider: Jearld Fenton, NP  DOB: 04/25/1956  PCP: Jearld Fenton, NP  DOS: 07/24/2018  Note by: Vevelyn Francois NP  Service setting: Ambulatory outpatient  Specialty: Interventional Pain Management  Location: [facility] (AMB) Pain Management Facility    Patient type: Established    Primary Reason(s) for Visit: Encounter for prescription drug management. (Level of risk: moderate)  CC: Back Pain  HPI  Ms. Oliveri is a 63 y.o. year old, female patient, who comes today for a medication management evaluation. She has Chronic pain syndrome; CAD (coronary artery disease); Pure hypercholesterolemia; DM2 (diabetes mellitus, type 2) ([place]); Failed back surgical syndrome; Failed cervical surgery syndrome (ACDF); Chronic neck pain; Chronic low back pain (Bilateral) (L>R); Epidural fibrosis; Cervical spondylosis; Neuropathic pain; Fibromyalgia; Presence of functional implant (Medtronic programmable intrathecal pump) (Right abdominal area); Presence of intrathecal pump (Medtronic intrathecal programmable pump) (40 mL pump); Pharmacologic therapy; Opioid-induced constipation (OIC); Chronic knee pain (Primary Area of Pain) (Right); Osteoarthritis of knee (Left); Anxiety and depression; Long term current use of opiate analgesic; Long term prescription opiate use; Opiate use; Encounter for adjustment or management of infusion pump; Osteoarthritis, multiple sites; Osteoarthritis of knee (Bilateral) (R>L); Other secondary hypertension; OSA (obstructive sleep apnea); Hip pain, acute, left; Lumbar spondylosis; Degenerative arthritis of knee (Bilateral); Lumbar spinal stenosis w/ neurogenic claudication; Disorder of skeletal system; Problems influencing health status; Abnormal MRI, lumbar spine (04/01/2018); Lumbar facet hypertrophy (Multilevel); Lumbar foraminal stenosis; Lumbar lateral recess stenosis; DDD (degenerative disc disease), lumbar; and Acute bursitis of left  shoulder on their problem list. Her primarily concern today is the Back Pain  Pain Assessment: Location: Lower Back Radiating: denies Onset: More than a month ago Duration: Chronic pain Quality: Aching, Dull Severity: 3 /10 (subjective, self-reported pain score)  Note: Reported level is compatible with observation.                          Effect on ADL: can do little more  Timing: Intermittent Modifying factors: medications, heat and rest, massages BP: 122/71  HR: 65  Ms. Yarrow was last scheduled for an appointment on 07/07/2018 for medication management. During today's appointment we reviewed Ms. Maybee's chronic pain status, as well as her outpatient medication regimen. She is now having pelvic pressure for one to 2 days. She notices this with standing only. She denies any change in bowel or bladder. She uses Miralax daily.   On 06/18/18 pt received right knee steroid injection at PCP office.  The patient  reports no history of drug use. Her body mass index is 31.57 kg/m.  Further details on both, my assessment(s), as well as the proposed treatment plan, please see below.  Controlled Substance Pharmacotherapy Assessment REMS (Risk Evaluation and Mitigation Strategy)  Analgesic:Oxycodone IR 5 mg every 8 hours (15 mg/day), in addition to the intrathecal pump medication.  MME/day:22 mg/day of oral medication Chauncey Fischer, RN  07/24/2018 11:20 AM  Sign when Signing Visit Nursing Pain Medication Assessment:  Safety precautions to be maintained throughout the outpatient stay will include: orient to surroundings, keep bed in low position, maintain call bell within reach at all times, provide assistance with transfer out of bed and ambulation.  Medication Inspection Compliance: Pill count conducted under aseptic conditions, in front of the patient. Neither the pills nor the bottle was removed from the patient's sight at any time. Once count was completed pills were immediately  returned to  the patient in their original bottle.  Medication: Oxycodone IR Pill/Patch Count: 60 of 90 pills remain Pill/Patch Appearance: Markings consistent with prescribed medication Bottle Appearance: Standard pharmacy container. Clearly labeled. Filled Date: 1 / 38 / 2020 Last Medication intake:  Today   Pharmacokinetics: Liberation and absorption (onset of action): WNL Distribution (time to peak effect): WNL Metabolism and excretion (duration of action): WNL         Pharmacodynamics: Desired effects: Analgesia: Ms. Mandt reports >50% benefit. Functional ability: Patient reports that medication allows her to accomplish basic ADLs Clinically meaningful improvement in function (CMIF): Sustained CMIF goals met Perceived effectiveness: Described as relatively effective, allowing for increase in activities of daily living (ADL) Undesirable effects: Side-effects or Adverse reactions: None reported Monitoring: [place] PMP: Online review of the past 37-monthperiod conducted. Compliant with practice rules and regulations Last UDS on record: Summary  Date Value Ref Range Status  01/23/2018 FINAL  Final    Comment:    ==================================================================== TOXASSURE SELECT 13 (MW) ==================================================================== Test                             Result       Flag       Units Drug Present and Declared for Prescription Verification   Oxycodone                      1016         EXPECTED   ng/mg creat   Oxymorphone                    159          EXPECTED   ng/mg creat   Noroxycodone                   1697         EXPECTED   ng/mg creat    Sources of oxycodone include scheduled prescription medications.    Oxymorphone and noroxycodone are expected metabolites of    oxycodone. Oxymorphone is also available as a scheduled    prescription medication. Drug Present not Declared for Prescription Verification   Fentanyl                        8            UNEXPECTED ng/mg creat   Norfentanyl                    94           UNEXPECTED ng/mg creat    Source of fentanyl is a scheduled prescription medication,    including IV, patch, and transmucosal formulations. Norfentanyl    is an expected metabolite of fentanyl. ==================================================================== Test                      Result    Flag   Units      Ref Range   Creatinine              150              mg/dL      >=20 ==================================================================== Declared Medications:  The flagging and interpretation on this report are based on the  following declared medications.  Unexpected results may arise from  inaccuracies in the declared medications.  **Note: The testing scope of this panel includes  these medications:  Oxycodone  **Note: The testing scope of this panel does not include following  reported medications:  Aspirin (Aspirin 81)  Bupropion  Cholecalciferol  Chondroitin (Glucosamine-Chondroitin)  Chromium  Cinnamon Bark  Glipizide  Glucosamine (Glucosamine-Chondroitin)  Magnesium  Multivitamin  Naloxone  Nitroglycerin  Paroxetine  Polyethylene Glycol  Pregabalin  Rosuvastatin  Supplement (Omega-3)  Tizanidine  Ubiquinone (Coenzyme Q 10)  Vitamin E ==================================================================== For clinical consultation, please call [phone]. ====================================================================    UDS interpretation: Compliant          Medication Assessment Form: Reviewed. Patient indicates being compliant with therapy Treatment compliance: Compliant Risk Assessment Profile: Aberrant behavior: See prior evaluations. None observed or detected today Comorbid factors increasing risk of overdose: See prior notes. No additional risks detected today Opioid risk tool (ORT) (Total Score): 1 Personal History of Substance Abuse  (SUD-Substance use disorder):  Alcohol: Negative  Illegal Drugs: Negative  Rx Drugs: Negative  ORT Risk Level calculation: Low Risk Risk of substance use disorder (SUD): Low Opioid Risk Tool - 07/24/18 1131      Family History of Substance Abuse   Alcohol  Negative    Illegal Drugs  Negative    Rx Drugs  Negative      Personal History of Substance Abuse   Alcohol  Negative    Illegal Drugs  Negative    Rx Drugs  Negative      Age   Age between 107-45 years   No      History of Preadolescent Sexual Abuse   History of Preadolescent Sexual Abuse  Negative or Female      Psychological Disease   Psychological Disease  Negative    Depression  Positive      Total Score   Opioid Risk Tool Scoring  1    Opioid Risk Interpretation  Low Risk      ORT Scoring interpretation table:  Score <3 = Low Risk for SUD  Score between 4-7 = Moderate Risk for SUD  Score >8 = High Risk for Opioid Abuse   Risk Mitigation Strategies:  Patient Counseling: Covered Patient-Prescriber Agreement (PPA): Present and active  Notification to other healthcare providers: Done  Pharmacologic Plan: No change in therapy, at this time.             Laboratory Chemistry  Inflammation Markers (CRP: Acute Phase) (ESR: Chronic Phase) Lab Results  Component Value Date   CRP 1 05/06/2018   ESRSEDRATE 20 05/06/2018   LATICACIDVEN 0.8 08/14/2017                         Rheumatology Markers No results found for: RF, ANA, LABURIC, URICUR, LYMEIGGIGMAB, LYMEABIGMQN, HLAB27                      Renal Function Markers Lab Results  Component Value Date   BUN 34 (H) 04/17/2018   CREATININE 1.08 (H) 04/17/2018   GFRAA >60 04/17/2018   GFRNONAA 55 (L) 04/17/2018                             Hepatic Function Markers Lab Results  Component Value Date   AST 26 08/14/2017   ALT 23 08/14/2017   ALBUMIN 4.3 08/14/2017   ALKPHOS 105 08/14/2017   HCVAB NEGATIVE 05/18/2016   LIPASE 11 10/04/2013  Electrolytes Lab Results  Component Value Date   NA 143 04/17/2018   K 5.0 04/17/2018   CL 103 04/17/2018   CALCIUM 9.7 04/17/2018   MG 2.1 06/30/2013                        Neuropathy Markers Lab Results  Component Value Date   VITAMINB12 319 10/04/2013   FOLATE >20.0 10/04/2013   HGBA1C 5.7 (H) 04/17/2018   HIV Non Reactive 02/22/2017                        CNS Tests Lab Results  Component Value Date   COLORCSF COLORLESS 09/11/2015   APPEARCSF CLEAR 09/11/2015   RBCCOUNTCSF 400 (H) 09/11/2015   WBCCSF 1 09/11/2015   LYMPHSCSF RARE 10/06/2013   LYMPHSCSF RARE 10/06/2013   PROTEINCSF 48 (H) 09/11/2015   GLUCCSF 71 (H) 09/11/2015                        Bone Pathology Markers Lab Results  Component Value Date   VD25OH 35.80 08/06/2017                         Coagulation Parameters Lab Results  Component Value Date   INR 1.12 09/11/2015   LABPROT 14.6 09/11/2015   APTT 31 09/11/2015   PLT 223 04/17/2018   DDIMER 3.85 (H) 10/05/2013                        Cardiovascular Markers Lab Results  Component Value Date   TROPONINI <0.03 09/11/2015   HGB 13.5 04/17/2018   HCT 43.2 04/17/2018                         CA Markers No results found for: CEA, CA125, LABCA2                      Note: Lab results reviewed.  Recent Diagnostic Imaging Results  [place] LIMITED JOINT SPACE STRUCTURES UP LEFT MSK US performed of: left  This study was ordered, performed, and interpreted by Charlann Boxer D.O.  Shoulder:  Supraspinatus: Appears normal on long and transverse views, Bursal bulge  seen with shoulder abduction on impingement view.  Infraspinatus: Appears normal on long and transverse views. Significant  increase in Doppler flow  Subscapularis: Appears normal on long and transverse views. Positive bursa   Teres Minor: Appears normal on long and transverse views.  AC joint: Capsule undistended, no geyser sign.  Glenohumeral Joint: Appears normal without effusion.   Glenoid Labrum: Intact without visualized tears.  Biceps Tendon: Appears normal on long and transverse views, no fraying of  tendon, tendon located in intertubercular groove, no subluxation with  shoulder internal or external rotation.  Impression: Subacromial bursitis   Procedure: Real-time Ultrasound Guided Injection of left glenohumeral  joint  Device: GE Logiq E  Ultrasound guided injection is preferred based studies that show increased  duration, increased effect, greater accuracy, decreased procedural pain,  increased response rate with ultrasound guided versus blind injection.  Verbal informed consent obtained.  Time-out conducted.  Noted no overlying erythema, induration, or other signs of local  infection.  Skin prepped in a sterile fashion.  Local anesthesia: Topical Ethyl chloride.  With sterile technique and under real time ultrasound guidance: Joint  visualized. 23g 1  inch  needle inserted posterior approach. Pictures  taken for needle placement. Patient did have injection of 2 cc of 1%  lidocaine, 2 cc of 0.5% Marcaine, and 1.0 cc of Kenalog 40 mg/dL.  Completed without difficulty  Pain immediately resolved suggesting accurate placement of the medication.   Advised to call if fevers/chills, erythema, induration, drainage, or  persistent bleeding.  Images permanently stored and available for review in the ultrasound unit.   Impression: Technically successful ultrasound guided injection.  Complexity Note: Imaging results reviewed. Results shared with Ms. Chana Bode, using Layman's terms.                         Meds   Current Outpatient Medications:  .  ACCU-CHEK AVIVA PLUS test strip, TEST 1 TIME DAILY **E11.9**, Disp: 200 each, Rfl: 2 .  ACCU-CHEK SOFTCLIX LANCETS lancets, Use to check blood sugar 1 time daily.  Dx: E11.42, Disp: 100 each, Rfl: 2 .  AMBULATORY NON FORMULARY MEDICATION, Medication Name: CPAP MASK OF CHOICE FOR HOME DEVICE, Disp: 1 each, Rfl: 0 .   aspirin 81 MG tablet, Take 81 mg by mouth daily., Disp: , Rfl:  .  buPROPion (WELLBUTRIN XL) 150 MG 24 hr tablet, Take 1 tablet (150 mg total) by mouth daily. MUST SCHEDULE ANNUAL EXAM, Disp: 90 tablet, Rfl: 3 .  Cinnamon 500 MG TABS, Take 1,000 mg by mouth daily., Disp: , Rfl:  .  Coenzyme Q10 (CO Q 10) 100 MG CAPS, Take 100 mg by mouth daily. , Disp: , Rfl:  .  glipiZIDE (GLUCOTROL XL) 2.5 MG 24 hr tablet, Take 1 tablet (2.5 mg total) by mouth daily with breakfast., Disp: 90 tablet, Rfl: 0 .  glucosamine-chondroitin 500-400 MG tablet, Take 2 tablets by mouth daily. , Disp: , Rfl:  .  Magnesium 250 MG TABS, Take 250 mg by mouth daily., Disp: , Rfl:  .  Misc Natural Products (TART CHERRY ADVANCED PO), Take 1,200 mg by mouth at bedtime. , Disp: , Rfl:  .  Multiple Vitamin (MULTIVITAMIN) tablet, Take 1 tablet by mouth daily., Disp: , Rfl:  .  naloxone (NARCAN) 2 MG/2ML injection, Inject 1 mL (1 mg total) into the muscle as needed for up to 2 doses (for opioid overdose). Inject content of syringe into thigh muscle. Call 911., Disp: 2 Syringe, Rfl: 1 .  nitroGLYCERIN (NITROSTAT) 0.4 MG SL tablet, Place 1 tablet (0.4 mg total) under the tongue every 5 (five) minutes as needed for chest pain., Disp: 25 tablet, Rfl: prn .  NONFORMULARY OR COMPOUNDED ITEM, 147.95 mcg by Epidural Infusion route Continuous EPIDURAL. Medtronic Neuromodulation pump Fentanyl 147.95 mcg, Baclofen, bupivicaine, Disp: , Rfl:  .  NONFORMULARY OR COMPOUNDED ITEM, 88.77 mcg by Epidural Infusion route Continuous EPIDURAL. Medtronic Neuromodulation pump: Fentanyl, baclofen 88.77, bupivicane, Disp: , Rfl:  .  NONFORMULARY OR COMPOUNDED ITEM, 5.918 mg by Epidural Infusion route Continuous EPIDURAL. Medtronic Neuromodulation pump: Fentanyl, Baclofen, Bupivicaine 5.918 mg, Disp: , Rfl:  .  Omega-3 Fatty Acids (FISH OIL PO), Take 1,600 mg by mouth 2 (two) times daily. , Disp: , Rfl:  .  [START ON 10/11/2018] oxyCODONE (OXY IR/ROXICODONE) 5 MG  immediate release tablet, Take 1 tablet (5 mg total) by mouth every 8 (eight) hours as needed for up to 30 days for severe pain., Disp: 90 tablet, Rfl: 0 .  PARoxetine (PAXIL) 20 MG tablet, TAKE ONE-HALF OF A TABLET BY MOUTH DAILY IN THE MORNING, Disp: 90 tablet, Rfl: 0 .  polyethylene  glycol (MIRALAX / GLYCOLAX) packet, Take 17 g by mouth daily. , Disp: , Rfl:  .  [START ON 07/29/2018] pregabalin (LYRICA) 100 MG capsule, Take 1 capsule (100 mg total) by mouth 3 (three) times daily., Disp: 270 capsule, Rfl: 0 .  rosuvastatin (CRESTOR) 20 MG tablet, Take 1 tablet (20 mg total) by mouth daily., Disp: 30 tablet, Rfl: 11 .  [START ON 07/29/2018] tiZANidine (ZANAFLEX) 4 MG tablet, Take 1 tablet (4 mg total) by mouth 3 (three) times daily., Disp: 90 tablet, Rfl: 2 .  TURMERIC PO, Take 1 tablet by mouth 2 (two) times daily. , Disp: , Rfl:  .  vitamin E 400 UNIT capsule, Take 400 Units by mouth daily., Disp: , Rfl:  .  [START ON 09/11/2018] oxyCODONE (OXY IR/ROXICODONE) 5 MG immediate release tablet, Take 1 tablet (5 mg total) by mouth every 8 (eight) hours as needed for up to 30 days for severe pain., Disp: 90 tablet, Rfl: 0 .  [START ON 08/12/2018] oxyCODONE (OXY IR/ROXICODONE) 5 MG immediate release tablet, Take 1 tablet (5 mg total) by mouth every 8 (eight) hours as needed for up to 30 days for severe pain., Disp: 90 tablet, Rfl: 0 .  Vitamin D, Ergocalciferol, (DRISDOL) 1.25 MG (50000 UT) CAPS capsule, TAKE 1 CAPSULE BY MOUTH EVERY 7 DAYS., Disp: 12 capsule, Rfl: 0  ROS  Constitutional: Denies any fever or chills Gastrointestinal: No reported hemesis, hematochezia, vomiting, or acute GI distress Musculoskeletal: Denies any acute onset joint swelling, redness, loss of ROM, or weakness Neurological: No reported episodes of acute onset apraxia, aphasia, dysarthria, agnosia, amnesia, paralysis, loss of coordination, or loss of consciousness  Allergies  Ms. Enslow has No Known Allergies.  [place]  Drug: Ms.  Lewing  reports no history of drug use. Alcohol:  reports current alcohol use. Tobacco:  reports that she quit smoking about 11 years ago. Her smoking use included cigarettes. She has a 48.00 pack-year smoking history. She has never used smokeless tobacco. 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[place]) (05/30/2010), Amputation of second toe, right, traumatic ([place]) (09/2017), Anemia, Anxiety, Blue toes, Bulging disc, CAD (coronary artery disease) (2009), Cardiac arrhythmia due to congenital heart disease, Chicken pox, Chronic fatigue, Chronic kidney disease, Coronary arteritis, Degenerative disc disease, Depression, Diabetes mellitus without complication ([place]), Facet joint disease, Fibromyalgia, Heart disease, Hyperlipidemia, IBS (irritable bowel syndrome), MCL deficiency, knee, MRSA (methicillin resistant staph aureus) culture positive (2011), Neuropathy (04/18/2010), Peripheral neuropathy, Restless leg syndrome, Sepsis ([place]) (09/2013), Sleep apnea (08/17/2003), and Spinal stenosis. Surgical: Ms. Gomm  has a past surgical history that includes Foot surgery (Right); Hand surgery (Left); Gallbladder surgery; Ablation; Ablation; HEART STENT (2009); HAND SURGEY (Left); Foot surgery (Bilateral); Foot surgery (Right); Infusion pump implantation; Back surgery; Cholecystectomy (2003); Anterior cervical decomp/discectomy fusion (N/A, 07/28/2014); Coronary angioplasty; Carpal tunnel release (Right); Eye surgery (Bilateral, 2013); Amputation toe (Right, 02/01/2017); Irrigation and debridement foot (Right, 02/23/2017); Toe Surgery; Hammer toe surgery (Right, 10/17/2017); Intrathecal pump revision (N/A, 04/25/2018); and Intrathecal pump revision (Right, 04/25/2018). Family: family history includes Colon cancer in her paternal grandfather; Diabetes in her father and paternal grandmother; Heart disease in her maternal grandfather; Lung cancer in her mother.  Constitutional Exam   General appearance: Well nourished, well developed, and well hydrated. In no apparent acute distress Vitals:   07/24/18 1120  BP: 122/71  Pulse: 65  Temp: 98.3 F (36.8 C)  SpO2: 95%  Weight: 220 lb (99.8 kg)  Height: '5\' 10"'$  (  1.778 m)  Psych/Mental status: Alert, oriented x 3 (person, place, & time)       Eyes: PERLA Respiratory: No evidence of acute respiratory distress  Cervical Spine Area Exam  Skin & Axial Inspection: Well healed scar from previous spine surgery detected Alignment: Symmetrical Functional ROM: Unrestricted ROM      Stability: No instability detected Muscle Tone/Strength: Functionally intact. No obvious neuro-muscular anomalies detected. Sensory (Neurological): Unimpaired Palpation: No palpable anomalies               Lumbar Spine Area Exam  Skin & Axial Inspection: Well healed scar from previous spine surgery detected Alignment: Symmetrical Functional ROM: Unrestricted ROM       Stability: No instability detected Muscle Tone/Strength: Functionally intact. No obvious neuro-muscular anomalies detected. Sensory (Neurological): Unimpaired Palpation: No palpable anomalies        Gait & Posture Assessment  Ambulation: Unassisted Gait: Relatively normal for age and body habitus Posture: WNL   Lower Extremity Exam    Side: Right lower extremity  Side: Left lower extremity  Stability: No instability observed          Stability: No instability observed          Skin & Extremity Inspection: Skin color, temperature, and hair growth are WNL. No peripheral edema or cyanosis. No masses, redness, swelling, asymmetry, or associated skin lesions. No contractures.  Skin & Extremity Inspection: Skin color, temperature, and hair growth are WNL. No peripheral edema or cyanosis. No masses, redness, swelling, asymmetry, or associated skin lesions. No contractures.  Functional ROM: Unrestricted ROM                  Functional ROM: Unrestricted ROM                  Muscle  Tone/Strength: Functionally intact. No obvious neuro-muscular anomalies detected.  Muscle Tone/Strength: Functionally intact. No obvious neuro-muscular anomalies detected.  Sensory (Neurological): Unimpaired        Sensory (Neurological): Unimpaired            Palpation: No palpable anomalies  Palpation: No palpable anomalies   Assessment  Primary Diagnosis & Pertinent Problem List: The primary encounter diagnosis was Fibromyalgia. Diagnoses of Cervical spondylosis, DDD (degenerative disc disease), lumbar, Chronic pain syndrome, and Long term prescription opiate use were also pertinent to this visit.  Status Diagnosis  Controlled Controlled Controlled 1. Fibromyalgia   2. Cervical spondylosis   3. DDD (degenerative disc disease), lumbar   4. Chronic pain syndrome   5. Long term prescription opiate use     Problems updated and reviewed during this visit: No problems updated. Plan of Care  Pharmacotherapy (Medications Ordered): Meds ordered this encounter  Medications  . naloxone (NARCAN) 2 MG/2ML injection    Sig: Inject 1 mL (1 mg total) into the muscle as needed for up to 2 doses (for opioid overdose). Inject content of syringe into thigh muscle. Call 911.    Dispense:  2 Syringe    Refill:  1    NDC # [id number]. Please teach proper use of device.    Order Specific Question:   Supervising Provider    Answer:   [name] [phone]  . oxyCODONE (OXY IR/ROXICODONE) 5 MG immediate release tablet    Sig: Take 1 tablet (5 mg total) by mouth every 8 (eight) hours as needed for up to 30 days for severe pain.    Dispense:  90 tablet    Refill:  0  Do not place this medication, or any other prescription from our practice, on "Automatic Refill". Patient may have prescription filled one day early if pharmacy is closed on scheduled refill date.    Order Specific Question:   Supervising Provider    Answer:   [name] [phone]  . oxyCODONE (OXY IR/ROXICODONE) 5 MG  immediate release tablet    Sig: Take 1 tablet (5 mg total) by mouth every 8 (eight) hours as needed for up to 30 days for severe pain.    Dispense:  90 tablet    Refill:  0    Do not place this medication, or any other prescription from our practice, on "Automatic Refill". Patient may have prescription filled one day early if pharmacy is closed on scheduled refill date.    Order Specific Question:   Supervising Provider    Answer:   [name] [phone]  . oxyCODONE (OXY IR/ROXICODONE) 5 MG immediate release tablet    Sig: Take 1 tablet (5 mg total) by mouth every 8 (eight) hours as needed for up to 30 days for severe pain.    Dispense:  90 tablet    Refill:  0    Do not place this medication, or any other prescription from our practice, on "Automatic Refill". Patient may have prescription filled one day early if pharmacy is closed on scheduled refill date.    Order Specific Question:   Supervising Provider    Answer:   [name] [phone]  . pregabalin (LYRICA) 100 MG capsule    Sig: Take 1 capsule (100 mg total) by mouth 3 (three) times daily.    Dispense:  270 capsule    Refill:  0    Do not place this medication, or any other prescription from our practice, on "Automatic Refill". Patient may have prescription filled one day early if pharmacy is closed on scheduled refill date.    Order Specific Question:   Supervising Provider    Answer:   [name] [phone]  . tiZANidine (ZANAFLEX) 4 MG tablet    Sig: Take 1 tablet (4 mg total) by mouth 3 (three) times daily.    Dispense:  90 tablet    Refill:  2    Order Specific Question:   Supervising Provider    Answer:   [name] [phone]   New Prescriptions   No medications on file   Medications administered today: Laporcha Marchesi had no medications administered during this visit. Lab-work, procedure(s), and/or referral(s): Orders Placed This Encounter  Procedures  . ToxASSURE Select 13 (MW), Urine    Imaging and/or referral(s): None  Interventional management options: Planned, scheduled, and/or pending:   Intrathecal pump management (refills and programming adjustments)   Considering:   Palliative/therapeutic intrathecal pump management (refills and programming adjustments) Diagnostic left-sided L1 transforaminal ESI Diagnostic left-sided L2 transforaminal ESI Diagnostic right-sided L4 transforaminal ESI Diagnostic right-sided L5 transforaminal ESI Diagnostic (Midline) L1-2 LESI Diagnostic (Midline) L2-3 LESI Diagnostic (Midline) L3-4 LESI Diagnostic (Midline) L4-5 LESI Diagnostic left-sided L1-2 LESI Diagnostic left-sided L2-3 LESI Diagnostic left-sided L3-4 LESI Diagnostic right-sided L3-4 LESI Diagnostic right-sided L4-5 LESI Diagnostic caudal ESI + diagnostic epidurogram Possible Racz procedure Diagnostic bilateral lumbar facet block Possible bilateral lumbar facet RFA Diagnostic bilateral intra-articular knee joint injection with local anesthetic and steroid Diagnostic bilateral genicular nerve block Possible bilateral genicular nerve RFA Possible series of 5, bilateral, intra-articular Hyalgan knee injection Diagnosticcervicalepidural steroid injection Diagnostic bilateral cervical facet blocks Possible bilateral cervical facet RFA   Palliative PRN treatment(s):  Palliative left-sided L2-3 interlaminar LESI #2 under fluoroscopic guidance and IV sedation     Provider-requested follow-up: Return in about 3 months (around 10/23/2018) for [facility]Mgmt, w/ Dr. Dossie Arbour, Pump refill based on programming.  Future Appointments  Date Time Provider [place]  07/29/2018 11:45 AM Lyndal Pulley, DO [facility]  08/06/2018  1:00 PM [name], MD [facility] None  11/04/2018  1:30 PM Vevelyn Francois, NP [facility] None   Primary Care Physician: Jearld Fenton, NP Location: [facility] Note by: Vevelyn Francois NP Date: 07/24/2018; Time: 12:18 PM  Pain Score Disclaimer: We use the NRS-11 scale. This is a self-reported, subjective measurement of pain severity with only modest accuracy. It is used primarily to identify changes within a particular patient. It must be understood that outpatient pain scales are significantly less accurate that those used for research, where they can be applied under ideal controlled circumstances with minimal exposure to variables. In reality, the score is likely to be a combination of pain intensity and pain affect, where pain affect describes the degree of emotional arousal or changes in action readiness caused by the sensory experience of pain. Factors such as social and work situation, setting, emotional state, anxiety levels, expectation, and prior pain experience may influence pain perception and show large inter-individual differences that may also be affected by time variables.  Patient instructions provided during this appointment: Patient Instructions   ____________________________________________________________________________________________  Medication Rules  Purpose: To inform patients, and their family members, of our rules and regulations.  Applies to: All patients receiving prescriptions (written or electronic).  Pharmacy of record: Pharmacy where electronic prescriptions will be sent. If written prescriptions are taken to a different pharmacy, please inform the nursing staff. The pharmacy listed in the electronic medical record should be the one where you would like electronic prescriptions to be sent.  Electronic prescriptions: In compliance with the [place] (STOP) Act of 2017 (Session Lanny Cramp [phone]), effective July 02, 2018, all controlled substances must be electronically prescribed. Calling prescriptions to the pharmacy will cease to exist.  Prescription refills: Only during scheduled  appointments. Applies to all prescriptions.  NOTE: The following applies primarily to controlled substances (Opioid* Pain Medications).   Patient's responsibilities: 1. Pain Pills: Bring all pain pills to every appointment (except for procedure appointments). 2. Pill Bottles: Bring pills in original pharmacy bottle. Always bring the newest bottle. Bring bottle, even if empty. 3. Medication refills: You are responsible for knowing and keeping track of what medications you take and those you need refilled. The day before your appointment: write a list of all prescriptions that need to be refilled. The day of the appointment: give the list to the admitting nurse. Prescriptions will be written only during appointments. If you forget a medication: it will not be "Called in", "Faxed", or "electronically sent". You will need to get another appointment to get these prescribed. No early refills. Do not call asking to have your prescription filled early. 4. Prescription Accuracy: You are responsible for carefully inspecting your prescriptions before leaving our office. Have the discharge nurse carefully go over each prescription with you, before taking them home. Make sure that your name is accurately spelled, that your address is correct. Check the name and dose of your medication to make sure it is accurate. Check the number of pills, and the written instructions to make sure they are clear and accurate. Make sure that you are given enough medication  to last until your next medication refill appointment. 5. Taking Medication: Take medication as prescribed. When it comes to controlled substances, taking less pills or less frequently than prescribed is permitted and encouraged. Never take more pills than instructed. Never take medication more frequently than prescribed.  6. Inform other Doctors: Always inform, all of your healthcare providers, of all the medications you take. 7. Pain Medication from other  Providers: You are not allowed to accept any additional pain medication from any other Doctor or Healthcare provider. There are two exceptions to this rule. (see below) In the event that you require additional pain medication, you are responsible for notifying us, as stated below. 8. Medication Agreement: You are responsible for carefully reading and following our Medication Agreement. This must be signed before receiving any prescriptions from our practice. Safely store a copy of your signed Agreement. Violations to the Agreement will result in no further prescriptions. (Additional copies of our Medication Agreement are available upon request.) 9. Laws, Rules, & Regulations: All patients are expected to follow all Federal and [employer], [employer], Rules, [employer]. Ignorance of the Laws does not constitute a valid excuse. The use of any illegal substances is prohibited. 10. Adopted CDC guidelines & recommendations: Target dosing levels will be at or below 60 MME/day. Use of benzodiazepines** is not recommended.  Exceptions: There are only two exceptions to the rule of not receiving pain medications from other Healthcare Providers. 1. Exception #1 (Emergencies): In the event of an emergency (i.e.: accident requiring emergency care), you are allowed to receive additional pain medication. However, you are responsible for: As soon as you are able, call our office [phone], at any time of the day or night, and leave a message stating your name, the date and nature of the emergency, and the name and dose of the medication prescribed. In the event that your call is answered by a member of our staff, make sure to document and save the date, time, and the name of the person that took your information.  2. Exception #2 (Planned Surgery): In the event that you are scheduled by another doctor or dentist to have any type of surgery or procedure, you are allowed (for a period no longer than 30 days), to receive  additional pain medication, for the acute post-op pain. However, in this case, you are responsible for picking up a copy of our "Post-op Pain Management for Surgeons" handout, and giving it to your surgeon or dentist. This document is available at our office, and does not require an appointment to obtain it. Simply go to our office during business hours (Monday-Thursday from 8:00 AM to 4:00 PM) (Friday 8:00 AM to 12:00 Noon) or if you have a scheduled appointment with [place], prior to your surgery, and ask for it by name. In addition, you will need to provide [place] with your name, name of your surgeon, type of surgery, and date of procedure or surgery.  *Opioid medications include: morphine, codeine, oxycodone, oxymorphone, hydrocodone, hydromorphone, meperidine, tramadol, tapentadol, buprenorphine, fentanyl, methadone. **Benzodiazepine medications include: diazepam (Valium), alprazolam (Xanax), clonazepam (Klonopine), lorazepam (Ativan), clorazepate (Tranxene), chlordiazepoxide (Librium), estazolam (Prosom), oxazepam (Serax), temazepam (Restoril), triazolam (Halcion) (Last updated: 08/29/2017) ____________________________________________________________________________________________   BMI Assessment: Estimated body mass index is 31.57 kg/m as calculated from the following:   Height as of this encounter: '5\' 10"'$  (1.778 m).   Weight as of this encounter: 220 lb (99.8 kg).  BMI interpretation table: BMI level Category Range association with higher incidence of  chronic pain  <18 kg/m2 Underweight   18.5-24.9 kg/m2 Ideal body weight   25-29.9 kg/m2 Overweight Increased incidence by 20%  30-34.9 kg/m2 Obese (Class I) Increased incidence by 68%  35-39.9 kg/m2 Severe obesity (Class II) Increased incidence by 136%  >40 kg/m2 Extreme obesity (Class III) Increased incidence by 254%   Patient's current BMI Ideal Body weight  Body mass index is 31.57 kg/m. Ideal body weight: 68.5 kg (151 lb 0.2 oz) Adjusted  ideal body weight: 81 kg (178 lb 9.7 oz)   BMI Readings from Last 4 Encounters:  07/24/18 31.57 kg/m  06/18/18 30.85 kg/m  06/04/18 30.85 kg/m  05/15/18 33.00 kg/m   Wt Readings from Last 4 Encounters:  07/24/18 220 lb (99.8 kg)  06/18/18 215 lb (97.5 kg)  06/04/18 215 lb (97.5 kg)  05/15/18 230 lb (104.3 kg)

## 2018-07-28 ENCOUNTER — Telehealth: Payer: Self-pay | Admitting: Pain Medicine

## 2018-07-28 ENCOUNTER — Telehealth: Payer: Self-pay | Admitting: *Deleted

## 2018-07-28 ENCOUNTER — Other Ambulatory Visit: Payer: Self-pay

## 2018-07-28 DIAGNOSIS — Z6831 Body mass index (BMI) 31.0-31.9, adult: Secondary | ICD-10-CM | POA: Diagnosis not present

## 2018-07-28 DIAGNOSIS — R030 Elevated blood-pressure reading, without diagnosis of hypertension: Secondary | ICD-10-CM | POA: Diagnosis not present

## 2018-07-28 DIAGNOSIS — Z9689 Presence of other specified functional implants: Secondary | ICD-10-CM | POA: Diagnosis not present

## 2018-07-28 DIAGNOSIS — E1142 Type 2 diabetes mellitus with diabetic polyneuropathy: Secondary | ICD-10-CM | POA: Diagnosis not present

## 2018-07-28 DIAGNOSIS — L97511 Non-pressure chronic ulcer of other part of right foot limited to breakdown of skin: Secondary | ICD-10-CM | POA: Diagnosis not present

## 2018-07-28 MED ORDER — PAIN MANAGEMENT IT PUMP REFILL: 1.0000 | Freq: Once | INTRATHECAL | 0 refills | Status: AC

## 2018-07-28 NOTE — Progress Notes (Signed)
[name] [address], [place] [address] Phone: [phone] Subjective:   Melinda [name], am serving as a scribe for Dr. Hulan Saas.    CC: Shoulder knee pain  SWN:[id number]  Melinda [name] is a 63 y.o. female coming in with complaint of shoulder and knee pain. She said that the injection alleviated her pain during her vacation.  Pain has improved in the right knee. Does still have pain intermittently. Feels a clicking sensation that makes her feel unstable on the right side. Was able to walk long distances during her vacation.  Patient does have known knee arthritis, instability.     Past Medical History:  Diagnosis Date  . 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 Amputation of second toe, right, traumatic ([place]) 09/2017  . Anemia    years ago  . Anxiety   . Blue toes    2nd toe on right foot, will get appt.  . Bulging disc   . CAD (coronary artery disease) 2009   s/p stent to LAD  . 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  Ablations done.  Now has rare episodes  . Chicken pox   . Chronic fatigue   . Chronic kidney disease    "problem with kidney filtration"  . Coronary arteritis   . Degenerative disc disease   . Depression   . Diabetes mellitus without complication ([place])   . Facet joint disease   . Fibromyalgia   . Heart disease   . Hyperlipidemia   . IBS (irritable bowel syndrome)   . MCL deficiency, knee   . MRSA (methicillin resistant staph aureus) culture positive 2011   GREAT TOE RIGHT FOOT  . Neuropathy 04/18/2010  . Peripheral neuropathy   . Restless leg syndrome   . Sepsis ([place]) 09/2013  . Sleep apnea 08/17/2003   uses CPAP, sleep study at [facility] (mild to moderate)  . Spinal stenosis    Past Surgical History:  Procedure Laterality Date  . ABLATION     UTERUS  . ABLATION     HEART  . AMPUTATION TOE Right 02/01/2017   Procedure: AMPUTATION TOE-RIGHT 2ND  MPJ;  Surgeon: Albertine Patricia, DPM;  Location: [facility];  Service: Podiatry;  Laterality: Right;  . ANTERIOR CERVICAL DECOMP/DISCECTOMY FUSION N/A 07/28/2014   Procedure: ANTERIOR CERVICAL DECOMPRESSION/DISCECTOMY FUSION CERVICAL 3-4,4-5,5-6 LEVELS WITH INSTRUMENTATION AND ALLOGRAFT;  Surgeon: Sinclair Ship, MD;  Location: [place];  Service: Orthopedics;  Laterality: N/A;  Anterior cervical decompression fusion, cervical 3-4, cervical 4-5, cervical 5-6 with instrumentation and allograft  . BACK SURGERY     X 3 1979, 1994, 1995  . CARPAL TUNNEL RELEASE Right   . CHOLECYSTECTOMY  2003  . CORONARY ANGIOPLASTY    . EYE SURGERY Bilateral 2013   Eyelid lift   . FOOT SURGERY Right    BIG TOE  . FOOT SURGERY Bilateral    PLANTAR FASCIITIS  . FOOT SURGERY Right    2ND TOE  . GALLBLADDER SURGERY    . HAMMER TOE SURGERY Right 10/17/2017   Procedure: HAMMER TOE CORRECTION-4TH TOE;  Surgeon: Albertine Patricia, DPM;  Location: [place];  Service: Podiatry;  Laterality: Right;  LMA- WITH LOCAL Diabetic - diet controlled  . HAND SURGERY Left   . HAND SURGEY Left   . HEART STENT  2009   LAD  . INFUSION PUMP IMPLANTATION     X2 with morphine and baclofen  .  INTRATHECAL PUMP REVISION N/A 04/25/2018   Procedure: Intrathecal pump replacement;  Surgeon: Clydell Hakim, MD;  Location: [place];  Service: Neurosurgery;  Laterality: N/A;  right  . INTRATHECAL PUMP REVISION Right 04/25/2018  . IRRIGATION AND DEBRIDEMENT FOOT Right 02/23/2017   Procedure: IRRIGATION AND DEBRIDEMENT FOOT-right foot;  Surgeon: Samara Deist, DPM;  Location: [facility];  Service: Podiatry;  Laterality: Right;  . TOE SURGERY     then revision 8/18   Social History   Socioeconomic History  . Marital status: Significant Other    Spouse name: Not on file  . Number of children: Not on file  . Years of education: Not on file  . Highest education level: Not on file  Occupational History  . Not on file  Social Needs    . Financial resource strain: Not on file  . Food insecurity:    Worry: Not on file    Inability: Not on file  . Transportation needs:    Medical: Not on file    Non-medical: Not on file  Tobacco Use  . Smoking status: Former Smoker    Packs/day: 1.50    Years: 32.00    Pack years: 48.00    Types: Cigarettes    Last attempt to quit: 07/22/2007    Years since quitting: 11.0  . Smokeless tobacco: Never Used  Substance and Sexual Activity  . Alcohol use: Yes    Alcohol/week: 0.0 standard drinks    Comment: occ - Holidays  . Drug use: [name]  . Sexual activity: Yes  Lifestyle  . Physical activity:    Days per week: Not on file    Minutes per session: Not on file  . Stress: Not on file  Relationships  . Social connections:    Talks on phone: Not on file    Gets together: Not on file    Attends religious service: Not on file    Active member of club or organization: Not on file    Attends meetings of clubs or organizations: Not on file    Relationship status: Not on file  Other Topics Concern  . Not on file  Social History Narrative   Lives at home with a partner. Independent at baseline   [name] Known Allergies Family History  Problem Relation Age of Onset  . Lung cancer Mother   . Diabetes Father   . Heart disease Maternal Grandfather   . Diabetes Paternal Grandmother   . Colon cancer Paternal Grandfather   . Stroke Neg Hx     Current Outpatient Medications (Endocrine & Metabolic):  .  glipiZIDE (GLUCOTROL XL) 2.5 MG 24 hr tablet, Take 1 tablet (2.5 mg total) by mouth daily with breakfast.  Current Outpatient Medications (Cardiovascular):  .  nitroGLYCERIN (NITROSTAT) 0.4 MG SL tablet, Place 1 tablet (0.4 mg total) under the tongue every 5 (five) minutes as needed for chest pain. .  rosuvastatin (CRESTOR) 20 MG tablet, Take 1 tablet (20 mg total) by mouth daily.   Current Outpatient Medications (Analgesics):  .  aspirin 81 MG tablet, Take 81 mg by mouth daily. Derrill Memo ON 10/11/2018] oxyCODONE (OXY IR/ROXICODONE) 5 MG immediate release tablet, Take 1 tablet (5 mg total) by mouth every 8 (eight) hours as needed for up to 30 days for severe pain. Derrill Memo ON 09/11/2018] oxyCODONE (OXY IR/ROXICODONE) 5 MG immediate release tablet, Take 1 tablet (5 mg total) by mouth every 8 (eight) hours as needed for up to 30 days for  severe pain. Derrill Memo ON 08/12/2018] oxyCODONE (OXY IR/ROXICODONE) 5 MG immediate release tablet, Take 1 tablet (5 mg total) by mouth every 8 (eight) hours as needed for up to 30 days for severe pain.   Current Outpatient Medications (Other):  Marland Kitchen  ACCU-CHEK AVIVA PLUS test strip, TEST 1 TIME DAILY **E11.9** .  ACCU-CHEK SOFTCLIX LANCETS lancets, Use to check blood sugar 1 time daily.  Dx: E11.42 .  AMBULATORY NON FORMULARY MEDICATION, Medication Name: CPAP MASK OF CHOICE FOR HOME DEVICE .  buPROPion (WELLBUTRIN XL) 150 MG 24 hr tablet, Take 1 tablet (150 mg total) by mouth daily. MUST SCHEDULE ANNUAL EXAM .  Cinnamon 500 MG TABS, Take 1,000 mg by mouth daily. .  Coenzyme Q10 (CO Q 10) 100 MG CAPS, Take 100 mg by mouth daily.  Marland Kitchen  glucosamine-chondroitin 500-400 MG tablet, Take 2 tablets by mouth daily.  .  Magnesium 250 MG TABS, Take 250 mg by mouth daily. .  Misc Natural Products (TART CHERRY ADVANCED PO), Take 1,200 mg by mouth at bedtime.  .  Multiple Vitamin (MULTIVITAMIN) tablet, Take 1 tablet by mouth daily. .  naloxone (NARCAN) 2 MG/2ML injection, Inject 1 mL (1 mg total) into the muscle as needed for up to 2 doses (for opioid overdose). Inject content of syringe into thigh muscle. Call 911. .  NONFORMULARY OR COMPOUNDED ITEM, 147.95 mcg by Epidural Infusion route Continuous EPIDURAL. Medtronic Neuromodulation pump Fentanyl 147.95 mcg, Baclofen, bupivicaine .  NONFORMULARY OR COMPOUNDED ITEM, 88.77 mcg by Epidural Infusion route Continuous EPIDURAL. Medtronic Neuromodulation pump: Fentanyl, baclofen 88.77, bupivicane .  NONFORMULARY OR  COMPOUNDED ITEM, 5.918 mg by Epidural Infusion route Continuous EPIDURAL. Medtronic Neuromodulation pump: Fentanyl, Baclofen, Bupivicaine 5.918 mg .  Omega-3 Fatty Acids (FISH OIL PO), Take 1,600 mg by mouth 2 (two) times daily.  Marland Kitchen  PARoxetine (PAXIL) 20 MG tablet, TAKE ONE-HALF OF A TABLET BY MOUTH DAILY IN THE MORNING .  polyethylene glycol (MIRALAX / GLYCOLAX) packet, Take 17 g by mouth daily.  .  pregabalin (LYRICA) 100 MG capsule, Take 1 capsule (100 mg total) by mouth 3 (three) times daily. Marland Kitchen  tiZANidine (ZANAFLEX) 4 MG tablet, Take 1 tablet (4 mg total) by mouth 3 (three) times daily. .  TURMERIC PO, Take 1 tablet by mouth 2 (two) times daily.  .  Vitamin D, Ergocalciferol, (DRISDOL) 1.25 MG (50000 UT) CAPS capsule, TAKE 1 CAPSULE BY MOUTH EVERY 7 DAYS. Marland Kitchen  vitamin E 400 UNIT capsule, Take 400 Units by mouth daily.    Past medical history, social, surgical and family history all reviewed in electronic medical record.  [name] pertanent information unless stated regarding to the chief complaint.   Review of Systems:  [name] headache, visual changes, nausea, vomiting, diarrhea, constipation, dizziness, abdominal pain, skin rash, fevers, chills, night sweats, weight loss, swollen lymph nodes, body aches, joint swelling, , chest pain, shortness of breath, mood changes.  Positive muscle aches  Objective  Blood pressure 132/82, pulse 69, height 5\' 10"  (1.778 m), weight 224 lb (101.6 kg), SpO2 96 %.   General: [name] apparent distress alert and oriented x3 mood and affect normal, dressed appropriately.  HEENT: Pupils equal, extraocular movements intact  Respiratory: Patient's speak in full sentences and does not appear short of breath  Cardiovascular: [name] lower extremity edema, non tender, [name] erythema  Skin: Warm dry intact with [name] signs of infection or rash on extremities or on axial skeleton.  Abdomen: Soft nontender  Neuro: Cranial nerves II through XII  are intact, neurovascularly intact in all  extremities with 2+ DTRs and 2+ pulses.  Lymph: [name] lymphadenopathy of posterior or anterior cervical chain or axillae bilaterally.  Gait normal with good balance and coordination.  MSK:  Non tender with full range of motion and good stability and symmetric strength and tone of , elbows, wrist, hip, and ankles bilaterally.  Left shoulder exam shows the patient does have full range of motion with mild impingement but nothing remarkable.  5 out of 5 strength of the rotator cuff Knee: Bilateral valgus deformity noted.  Abnormal thigh to calf ratio.  Tender to palpation over medial and PF joint line.  More right greater than left ROM full in flexion and extension and lower leg rotation. instability with valgus force.  [name] right greater than left painful patellar compression. Patellar glide with moderate crepitus. Patellar and quadriceps tendons unremarkable. Hamstring and quadriceps strength is normal.     Impression and Recommendations:      The above documentation has been reviewed and is accurate and complete Lyndal Pulley, DO       Note: This dictation was prepared with Dragon dictation along with smaller phrase technology. Any transcriptional errors that result from this process are unintentional.

## 2018-07-28 NOTE — Telephone Encounter (Signed)
Patient lvmail on Friday at 11:34  She can feel her pain pump moving around freely. She has lost some weight and wonders if this is the problem. Patient is afraid the pain med lines will get kinked up and not flow as they should. She has put in a call to the surgeon that recently put in her pain pump but wants to see what Dr. Dossie Arbour thinks. Please call patient.

## 2018-07-28 NOTE — Telephone Encounter (Signed)
Melinda [name], she is enroute to see [name] to have this checked out.  She will let us know that outcome.

## 2018-07-28 NOTE — Telephone Encounter (Signed)
Melinda [name] came by office to let us know that the intrathecal pain pump is in fact unanchored per [name] but he doesn't feel that this will be a problem.  He did offer to put her on surgical schedule and gave warning s/s.  I did run this by [name] and he had several concerns about the pump being unanchored and these were then reviewed with Angely.  I also called Particia Lather and she too believes that a revision should be done.  I called [name] office and spoke with [facility] to let her know the concerns.  She states that per the clinic note that Raney had decided to proceed with weight loss and then may consider a revision at that time.    I will call Kaytlin in the morning to let her know about this conversation.

## 2018-07-29 ENCOUNTER — Telehealth: Payer: Self-pay

## 2018-07-29 ENCOUNTER — Encounter: Payer: Self-pay | Admitting: Family Medicine

## 2018-07-29 ENCOUNTER — Ambulatory Visit (INDEPENDENT_AMBULATORY_CARE_PROVIDER_SITE_OTHER): Payer: Medicare Other | Admitting: Family Medicine

## 2018-07-29 DIAGNOSIS — M170 Bilateral primary osteoarthritis of knee: Secondary | ICD-10-CM | POA: Diagnosis not present

## 2018-07-29 DIAGNOSIS — M7552 Bursitis of left shoulder: Secondary | ICD-10-CM | POA: Diagnosis not present

## 2018-07-29 LAB — TOXASSURE SELECT 13 (MW), URINE

## 2018-07-29 NOTE — Patient Instructions (Signed)
Good to see you  Melinda [name] is your friend Keep trucking along  See me again in 6ish week just to make sure you are doing well

## 2018-07-29 NOTE — Assessment & Plan Note (Signed)
Patient has been doing relatively well with conservative therapy.  Right knee still has instability but significant decrease in inflammation and swelling from previous exam.  Discussed with patient about the possibility of viscosupplementation.  Patient was to continue with conservative therapy at this time.  Follow-up with me again in 6 weeks

## 2018-07-29 NOTE — Telephone Encounter (Signed)
Spoke with patient and informed her [name] nd Manuela Schwartz from [employer] recommend revisionj of IT pump.  Patient states she will call [name] office.

## 2018-07-29 NOTE — Assessment & Plan Note (Signed)
Patient doing well after injection.  Continue conservative therapy and follow-up as needed

## 2018-07-31 ENCOUNTER — Other Ambulatory Visit: Payer: Self-pay | Admitting: Anesthesiology

## 2018-08-06 ENCOUNTER — Other Ambulatory Visit: Payer: Self-pay

## 2018-08-06 ENCOUNTER — Encounter: Payer: Self-pay | Admitting: Pain Medicine

## 2018-08-06 ENCOUNTER — Ambulatory Visit: Payer: Medicare Other | Attending: Pain Medicine | Admitting: Pain Medicine

## 2018-08-06 VITALS — BP 149/84 | HR 74 | Temp 97.9°F | Ht 70.0 in | Wt 215.0 lb

## 2018-08-06 DIAGNOSIS — G894 Chronic pain syndrome: Secondary | ICD-10-CM | POA: Diagnosis not present

## 2018-08-06 DIAGNOSIS — Z978 Presence of other specified devices: Secondary | ICD-10-CM | POA: Diagnosis present

## 2018-08-06 DIAGNOSIS — G8929 Other chronic pain: Secondary | ICD-10-CM | POA: Insufficient documentation

## 2018-08-06 DIAGNOSIS — F1190 Opioid use, unspecified, uncomplicated: Secondary | ICD-10-CM | POA: Diagnosis present

## 2018-08-06 DIAGNOSIS — M545 Low back pain, unspecified: Secondary | ICD-10-CM | POA: Insufficient documentation

## 2018-08-06 DIAGNOSIS — M2032 Hallux varus (acquired), left foot: Secondary | ICD-10-CM | POA: Diagnosis not present

## 2018-08-06 DIAGNOSIS — Z89421 Acquired absence of other right toe(s): Secondary | ICD-10-CM | POA: Diagnosis not present

## 2018-08-06 DIAGNOSIS — M25561 Pain in right knee: Secondary | ICD-10-CM | POA: Insufficient documentation

## 2018-08-06 DIAGNOSIS — M2042 Other hammer toe(s) (acquired), left foot: Secondary | ICD-10-CM | POA: Diagnosis not present

## 2018-08-06 DIAGNOSIS — Z451 Encounter for adjustment and management of infusion pump: Secondary | ICD-10-CM

## 2018-08-06 DIAGNOSIS — M961 Postlaminectomy syndrome, not elsewhere classified: Secondary | ICD-10-CM | POA: Diagnosis present

## 2018-08-06 DIAGNOSIS — B351 Tinea unguium: Secondary | ICD-10-CM | POA: Diagnosis not present

## 2018-08-06 DIAGNOSIS — L851 Acquired keratosis [keratoderma] palmaris et plantaris: Secondary | ICD-10-CM | POA: Diagnosis not present

## 2018-08-06 DIAGNOSIS — Z79891 Long term (current) use of opiate analgesic: Secondary | ICD-10-CM | POA: Insufficient documentation

## 2018-08-06 DIAGNOSIS — M48062 Spinal stenosis, lumbar region with neurogenic claudication: Secondary | ICD-10-CM | POA: Insufficient documentation

## 2018-08-06 DIAGNOSIS — E1142 Type 2 diabetes mellitus with diabetic polyneuropathy: Secondary | ICD-10-CM | POA: Diagnosis not present

## 2018-08-06 DIAGNOSIS — Z969 Presence of functional implant, unspecified: Secondary | ICD-10-CM

## 2018-08-06 MED ORDER — NALOXONE HCL 2 MG/2ML IJ SOSY: 1.0000 mg | PREFILLED_SYRINGE | INTRAMUSCULAR | 1 refills | Status: DC | PRN

## 2018-08-06 NOTE — Progress Notes (Signed)
Patient's Name: Melinda [name]  MRN: [id number]  Referring Provider: Jearld Fenton, NP  DOB: Nov 28, 1955  PCP: Jearld Fenton, NP  DOS: 08/06/2018  Note by: Gaspar Cola, MD  Service setting: Ambulatory outpatient  Specialty: Interventional Pain Management  Patient type: Established  Location: [facility] (AMB) Pain Management Facility  Visit type: Interventional Procedure   Primary Reason for Visit: Interventional Pain Management Treatment. CC: Generalized Body Aches  Procedure:          Intrathecal Drug Delivery System (IDDS):  Type: Reservoir Refill [id number]) No rate change Region: Abdominal Laterality: Right  Type of Pump: [employer] Synchromed II (MRI-compatible) Delivery Route: Intrathecal Type of Pain Treated: Neuropathic/Nociceptive Primary Medication Class: Opioid/opiate  Medication, Concentration, Infusion Program, & Delivery Rate: Please see scanned programming printout.   Indications: 1. Chronic pain syndrome   2. 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   3. Failed back surgical syndrome   4. Failed cervical surgery syndrome (ACDF)   5. Lumbar spinal stenosis w/ neurogenic claudication   6. Chronic knee pain (Primary Area of Pain) (Right)   7. Presence of functional implant ([employer] programmable intrathecal pump) (Right abdominal area)   8. Presence of intrathecal pump ([employer] intrathecal programmable pump) (40 mL pump)   9. Encounter for adjustment or management of infusion pump   10. Opiate use   11. Long term prescription opiate use   12. Long term current use of opiate analgesic    Pain Assessment: Self-Reported Pain Score: 4 /10             Reported level is compatible with observation.        Physical exam today was positive for tenderness to palpation over the right paravertebral muscles, and the location of the catheter anchor.  Intrathecal Pump Therapy Assessment  Manufacturer: [employer] Synchromed II Type:  Programmable Volume: 40 mL reservoir MRI compatibility: Yes   Drug content:  Primary Medication Class: Opioid Primary Medication: PF-Fentanyl (500 mcg/mL)  Secondary Medication: PF-Bupivacaine (20 mg/mL)  Other Medication: PF-Baclofen (300 mcg/mL)    Programming:  Type: Simple continuous. See pump readout for details.   Changes:  Medication Change: None at this point Rate Change: No change in rate  Reported side-effects or adverse reactions: None reported  Effectiveness: Described as relatively effective, allowing for increase in activities of daily living (ADL) Clinically meaningful improvement in function (CMIF): Sustained CMIF goals met  Plan: Pump refill today  Pre-op Assessment:  Ms. Youkhana is a 63 y.o. (year old), female patient, seen today for interventional treatment. 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 Ms. Heard has a current medication list which includes the following prescription(s): accu-chek aviva plus, accu-chek softclix lancets, AMBULATORY NON FORMULARY MEDICATION, aspirin, bupropion, cinnamon, co q 10, diclofenac sodium, glipizide, glucosamine-chondroitin, magnesium, misc natural products, multivitamin, naloxone, nitroglycerin, NONFORMULARY OR COMPOUNDED ITEM, NONFORMULARY OR COMPOUNDED ITEM, NONFORMULARY OR COMPOUNDED ITEM, fish oil, oxycodone,  oxycodone, oxycodone, paroxetine, polyethylene glycol, pregabalin, rosuvastatin, tizanidine, turmeric, vitamin e, and vitamin d (ergocalciferol). Her primarily concern today is the Generalized  Body Aches  Initial Vital Signs:  Pulse/HCG Rate: 74  Temp: 97.9 F (36.6 C) Resp:   BP: (!) 149/84 SpO2: 99 %  BMI: Estimated body mass index is 30.85 kg/m as calculated from the following:   Height as of this encounter: _0  (1.778 m).   Weight as of this encounter: 215 lb (97.5 kg).  Risk Assessment: Allergies: Reviewed. She has No Known Allergies.  Allergy Precautions: None required Coagulopathies: Reviewed. None identified.  Blood-thinner therapy: None at this time Active Infection(s): Reviewed. None identified. Ms. Brandt is afebrile  Site Confirmation: Ms. Belli was asked to confirm the procedure and laterality before marking the site Procedure checklist: Completed Consent: Before the procedure and under the influence of no sedative(s), amnesic(s), or anxiolytics, the patient was informed of the treatment options, risks and possible complications. To fulfill our ethical and legal obligations, as recommended by the American Medical Association's Code of Ethics, I have informed the patient of my clinical impression; the nature and purpose of the treatment or procedure; the risks, benefits, and possible complications of the intervention; the alternatives, including doing nothing; the risk(s) and benefit(s) of the alternative treatment(s) or procedure(s); and the risk(s) and benefit(s) of doing nothing.  Ms. Mcgough was provided with information about the general risks and possible complications associated with most interventional procedures. These include, but are not limited to: failure to achieve desired goals, infection, bleeding, organ or nerve damage, allergic reactions, paralysis, and/or death.  In addition, she was informed of those risks and possible complications associated to this  particular procedure, which include, but are not limited to: damage to the implant; failure to decrease pain; local, systemic, or serious CNS infections, intraspinal abscess with possible cord compression and paralysis, or life-threatening such as meningitis; bleeding; organ damage; nerve injury or damage with subsequent sensory, motor, and/or autonomic system dysfunction, resulting in transient or permanent pain, numbness, and/or weakness of one or several areas of the body; allergic reactions, either minor or major life-threatening, such as anaphylactic or anaphylactoid reactions.  Furthermore, Ms. Sanagustin was informed of those risks and complications associated with the medications. These include, but are not limited to: allergic reactions (i.e.: anaphylactic or anaphylactoid reactions); endorphine suppression; bradycardia and/or hypotension; water retention and/or peripheral vascular relaxation leading to lower extremity edema and possible stasis ulcers; respiratory depression and/or shortness of breath; decreased metabolic rate leading to weight gain; swelling or edema; medication-induced neural toxicity; particulate matter embolism and blood vessel occlusion with resultant organ, and/or nervous system infarction; and/or intrathecal granuloma formation with possible spinal cord compression and permanent paralysis.  Before refilling the pump Ms. Schellhorn was informed that some of the medications used in the devise may not be FDA approved for such use and therefore it constitutes an off-label use of the medications.  Finally, she was informed that Medicine is not an exact science; therefore, there is also the possibility of unforeseen or unpredictable risks and/or possible complications that may result in a catastrophic outcome. The patient indicated having understood very clearly. We have given the patient no guarantees and we have made no promises. Enough time was given to the patient to ask questions, all  of which were answered to the patient's satisfaction. Ms. Basaldua has indicated that she wanted to continue with the procedure. Attestation: I, the ordering provider, attest that I have discussed with the patient the benefits, risks, side-effects, alternatives, likelihood of achieving goals, and potential problems during recovery for the procedure that I have provided informed consent. Date  Time: 08/06/2018  1:07 PM  Pre-Procedure Preparation:  Monitoring: As per clinic protocol. Respiration, ETCO2, SpO2, BP, heart rate and rhythm monitor placed and checked for adequate function Safety Precautions: Patient was assessed for positional comfort and pressure points before starting the procedure. Time-out: I initiated and conducted the "Time-out" before starting the procedure, as per protocol. The patient was asked to participate by confirming the accuracy of the "Time Out" information. Verification of the correct person, site, and procedure were performed and confirmed by me, the nursing staff, and the patient. "Time-out" conducted as per Joint Commission's Universal Protocol (UP.01.01.01). Time: 1325  Description of Procedure:          Position: Supine Target Area: Central-port of intrathecal pump. Approach: Anterior, 90 degree angle approach. Area Prepped: Entire Area around the pump implant. Prepping solution: ChloraPrep (2% chlorhexidine gluconate and 70% isopropyl alcohol) Safety Precautions: Aspiration looking for blood return was conducted prior to all injections. At no point did we inject any substances, as a needle was being advanced. No attempts were made at seeking any paresthesias. Safe injection practices and needle disposal techniques used. Medications properly checked for expiration dates. SDV (single dose vial) medications used. Description of the Procedure: Protocol guidelines were followed. Two nurses trained to do implant refills were present during the entire procedure. The refill  medication was checked by both healthcare providers as well as the patient. The patient was included in the "Time-out" to verify the medication. The patient was placed in position. The pump was identified. The area was prepped in the usual manner. The sterile template was positioned over the pump, making sure the side-port location matched that of the pump. Both, the pump and the template were held for stability. The needle provided in the [employer] Kit was then introduced thru the center of the template and into the central port. The pump content was aspirated and discarded volume documented. The new medication was slowly infused into the pump, thru the filter, making sure to avoid overpressure of the device. The needle was then removed and the area cleansed, making sure to leave some of the prepping solution back to take advantage of its long term bactericidal properties. The pump was interrogated and programmed to reflect the correct medication, volume, and dosage. The program was printed and taken to the physician for approval. Once checked and signed by the physician, a copy was provided to the patient and another scanned into the EMR. Vitals:   08/06/18 1258  BP: (!) 149/84  Pulse: 74  Temp: 97.9 F (36.6 C)  SpO2: 99%  Weight: 215 lb (97.5 kg)  Height: _0  (1.778 m)    Start Time: 1330 hrs. End Time:   hrs. Materials & Medications: [employer] Refill Kit Medication(s): Please see chart orders for details.  Imaging Guidance:          Type of Imaging Technique: None used Indication(s): N/A Exposure Time: No patient exposure Contrast: None used. Fluoroscopic Guidance: N/A Ultrasound Guidance: N/A Interpretation: N/A  Antibiotic Prophylaxis:   Anti-infectives (From admission, onward)   None     Indication(s): None identified  Post-operative Assessment:  Post-procedure Vital Signs:  Pulse/HCG Rate: 74  Temp: 97.9 F (36.6 C) Resp:   BP: (!) 149/84 SpO2: 99 %  EBL:  None  Complications: No immediate post-treatment complications observed by team, or reported by patient.  Note: The patient tolerated the entire procedure well. A repeat set of vitals were taken after the procedure and the patient was kept  under observation following institutional policy, for this type of procedure. Post-procedural neurological assessment was performed, showing return to baseline, prior to discharge. The patient was provided with post-procedure discharge instructions, including a section on how to identify potential problems. Should any problems arise concerning this procedure, the patient was given instructions to immediately contact us, at any time, without hesitation. In any case, we plan to contact the patient by telephone for a follow-up status report regarding this interventional procedure.  Comments:  No additional relevant information.  Plan of Care  Interventional management options: Planned, scheduled, and/or pending:   The patient is pending to have an intrathecal pump replacement and we have sent a letter to Dr. Clydell Hakim to see if he can also do a catheter revision since the anchor seems to be triggering pain by way of pain a foreign object putting pressure over the paravertebral muscle. Intrathecal pump management (refills and programming adjustments)   Considering:   Palliative/therapeutic intrathecal pump management (refills and programming adjustments) Diagnostic left-sided L1 transforaminal ESI Diagnostic left-sided L2 transforaminal ESI Diagnostic right-sided L4 transforaminal ESI Diagnostic right-sided L5 transforaminal ESI Diagnostic (Midline) L1-2 LESI Diagnostic (Midline) L2-3 LESI Diagnostic (Midline) L3-4 LESI Diagnostic (Midline) L4-5 LESI Diagnostic left-sided L1-2 LESI Diagnostic left-sided L2-3 LESI Diagnostic left-sided L3-4 LESI Diagnostic right-sided L3-4 LESI Diagnostic right-sided L4-5 LESI Diagnostic caudal ESI +  diagnostic epidurogram Possible Racz procedure Diagnostic bilateral lumbar facet block Possible bilateral lumbar facet RFA Diagnostic bilateral intra-articular knee joint injection with local anesthetic and steroid Diagnostic bilateral genicular nerve block Possible bilateral genicular nerve RFA Possible series of 5, bilateral, intra-articular Hyalgan knee injection Diagnosticcervicalepidural steroid injection Diagnostic bilateral cervical facet blocks Possible bilateral cervical facet RFA   Palliative PRN treatment(s):   Palliative left-sided L2-3 interlaminar LESI #2 under fluoroscopic guidance and IV sedation   Imaging Orders  No imaging studies ordered today    Procedure Orders     PUMP REFILL     PUMP REFILL  Medications ordered for procedure: Meds ordered this encounter  Medications  . naloxone (NARCAN) 2 MG/2ML injection    Sig: Inject 1 mL (1 mg total) into the muscle as needed for up to 2 doses (for opioid overdose). Inject content of syringe into thigh muscle. Call 911.    Dispense:  2 Syringe    Refill:  1    NDC # [id number]. Please teach proper use of device.   Medications administered: Lubertha South had no medications administered during this visit.  See the medical record for exact dosing, route, and time of administration.  Disposition: Discharge home  Discharge Date & Time: 08/06/2018; 1153 hrs.   Physician-requested Follow-up: Return for Pump Refill (as per pump program) (Max:80mo.  Future Appointments  Date Time Provider [place] 09/09/2018  1:15 PM SGary FleetLBPC-[facility]  11/05/2018  1:00 PM [facility]   Primary Care Physician: BJearld Fenton NP [facility] Note by: FGaspar Cola MD Date: 08/06/2018; Time: 1:43 PM  Disclaimer:  Medicine is not an eChief Strategy Officer The only guarantee in medicine is that nothing is guaranteed. It is important to  note that the decision to proceed with this intervention was based on the information collected from the patient. The Data and conclusions were drawn from the patient's questionnaire, the interview, and the physical examination. Because the information was provided in large part by the patient, it cannot be guaranteed that it has not been purposely or unconsciously  manipulated. Every effort has been made to obtain as much relevant data as possible for this evaluation. It is important to note that the conclusions that lead to this procedure are derived in large part from the available data. Always take into account that the treatment will also be dependent on availability of resources and existing treatment guidelines, considered by other Pain Management Practitioners as being common knowledge and practice, at the time of the intervention. For Medico-Legal purposes, it is also important to point out that variation in procedural techniques and pharmacological choices are the acceptable norm. The indications, contraindications, technique, and results of the above procedure should only be interpreted and judged by a Board-Certified Interventional Pain Specialist with extensive familiarity and expertise in the same exact procedure and technique.

## 2018-08-06 NOTE — Progress Notes (Signed)
Safety precautions to be maintained throughout the outpatient stay will include: orient to surroundings, keep bed in low position, maintain call bell within reach at all times, provide assistance with transfer out of bed and ambulation. Nursing Pain Medication Assessment:  Safety precautions to be maintained throughout the outpatient stay will include: orient to surroundings, keep bed in low position, maintain call bell within reach at all times, provide assistance with transfer out of bed and ambulation.  Medication Inspection Compliance: Pill count conducted under aseptic conditions, in front of the patient. Neither the pills nor the bottle was removed from the patient's sight at any time. Once count was completed pills were immediately returned to the patient in their original bottle.  Medication: Oxycodone IR Pill/Patch Count: 21 of 90 pills remain Pill/Patch Appearance: Markings consistent with prescribed medication Bottle Appearance: Standard pharmacy container. Clearly labeled. Filled Date: 01 / 09/ 2020 Last Medication intake:  Today

## 2018-08-07 NOTE — Pre-Procedure Instructions (Addendum)
Tequita Marrs  08/07/2018      [id number] [place] [facility], [place] [address] [place] [address] Phone: [phone] Fax: [phone]  [id number] [place], [place] - [address] [place] [address] [place] [address] Phone: [phone] Fax: [phone]    Your procedure is scheduled on August 15, 2018.  Report to [facility] Admitting at 1045 AM.  Call this number if you have problems the morning of surgery:  [phone]   Remember:  Do not eat or drink after midnight.     Take these medicines the morning of surgery with A SIP OF WATER  buPROPion (WELLBUTRIN XL)  pregabalin (LYRICA)  PARoxetine (PAXIL)  tiZANidine (ZANAFLEX)  nitroGLYCERIN (NITROSTAT) -if needed for chest pain oxyCODONE (OXY IR/ROXICODONE)-if needed for pain  Follow your surgeon's instructions on when to hold/resume aspirin.  If no instructions were given call the office to determine how they would like to you take aspirin   7 days prior to surgery STOP taking any diclofenac (Pennsaid), Aspirin (unless otherwise instructed by your surgeon), Aleve, Naproxen, Ibuprofen, Motrin, Advil, Goody's, BC's, all herbal medications, fish oil, and all vitamins   WHAT DO I DO ABOUT MY DIABETES MEDICATION?  Marland Kitchen Do not take oral diabetes medicines (pills) the morning of surgery-glipiZIDE (GLUCOTROL XL) 2.5 MG   Reviewed and Endorsed by [facility] Patient Education Committee, August 2015  How to Manage Your Diabetes Before and After Surgery  Why is it important to control my blood sugar before and after surgery? . Improving blood sugar levels before and after surgery helps healing and can limit problems. . A way of improving blood sugar control is eating a healthy diet by: o  Eating less sugar and carbohydrates o  Increasing activity/exercise o  Talking with your doctor about reaching your blood sugar goals . High blood sugars (greater than 180 mg/dL)  can raise your risk of infections and slow your recovery, so you will need to focus on controlling your diabetes during the weeks before surgery. . Make sure that the doctor who takes care of your diabetes knows about your planned surgery including the date and location.  How do I manage my blood sugar before surgery? . Check your blood sugar at least 4 times a day, starting 2 days before surgery, to make sure that the level is not too high or low. o Check your blood sugar the morning of your surgery when you wake up and every 2 hours until you get to the Short Stay unit. . If your blood sugar is less than 70 mg/dL, you will need to treat for low blood sugar: o Do not take insulin. o Treat a low blood sugar (less than 70 mg/dL) with  cup of clear juice (cranberry or apple), 4 glucose tablets, OR glucose gel. Recheck blood sugar in 15 minutes after treatment (to make sure it is greater than 70 mg/dL). If your blood sugar is not greater than 70 mg/dL on recheck, call [phone] o  for further instructions. . Report your blood sugar to the short stay nurse when you get to Short Stay.  . If you are admitted to the hospital after surgery: o Your blood sugar will be checked by the staff and you will probably be given insulin after surgery (instead of oral diabetes medicines) to make sure you have good blood sugar levels. o The goal for blood sugar control after surgery is 80-180 mg/dL.  [place]- Preparing For Surgery  Before surgery, you can play an important role. Because skin is not sterile, your skin needs to be as free of germs as possible. You can reduce the number of germs on your skin by washing with CHG (chlorahexidine gluconate) Soap before surgery.  CHG is an antiseptic cleaner which kills germs and bonds with the skin to continue killing germs even after washing.    Oral Hygiene is also important to reduce your risk of infection.  Remember - BRUSH YOUR TEETH THE MORNING OF  SURGERY WITH YOUR REGULAR TOOTHPASTE  Please do not use if you have an allergy to CHG or antibacterial soaps. If your skin becomes reddened/irritated stop using the CHG.  Do not shave (including legs and underarms) for at least 48 hours prior to first CHG shower. It is OK to shave your face.  Please follow these instructions carefully.   1. Shower the NIGHT BEFORE SURGERY and the MORNING OF SURGERY with CHG.   2. If you chose to wash your hair, wash your hair first as usual with your normal shampoo.  3. After you shampoo, rinse your hair and body thoroughly to remove the shampoo.  4. Use CHG as you would any other liquid soap. You can apply CHG directly to the skin and wash gently with a scrungie or a clean washcloth.   5. Apply the CHG Soap to your body ONLY FROM THE NECK DOWN.  Do not use on open wounds or open sores. Avoid contact with your eyes, ears, mouth and genitals (private parts). Wash Face and genitals (private parts)  with your normal soap.  6. Wash thoroughly, paying special attention to the area where your surgery will be performed.  7. Thoroughly rinse your body with warm water from the neck down.  8. DO NOT shower/wash with your normal soap after using and rinsing off the CHG Soap.  9. Pat yourself dry with a CLEAN TOWEL.  10. Wear CLEAN PAJAMAS to bed the night before surgery, wear comfortable clothes the morning of surgery  11. Place CLEAN SHEETS on your bed the night of your first shower and DO NOT SLEEP WITH PETS.   Day of Surgery:  Do not apply any deodorants/lotions.  Please wear clean clothes to the hospital/surgery center.   Remember to brush your teeth WITH YOUR REGULAR TOOTHPASTE.    Do not wear jewelry, make up or nail polish  Do not wear lotions, powders, or Perfumes, or deodorant.  Do not shave 48 hours prior to procedure  Do not bring valuables to the hospital.  [facility] is not responsible for any belongings or valuables.  Contacts, dentures  or bridgework may not be worn into surgery.  Leave your suitcase in the car.  After surgery it may be brought to your room.  For patients admitted to the hospital, discharge time will be determined by your treatment team.  Patients discharged the day of surgery will not be allowed to drive home.   Please read over the following fact sheets that you were given. Pain Booklet, Coughing and Deep Breathing, MRSA Information and Surgical Site Infection Prevention

## 2018-08-08 ENCOUNTER — Encounter (HOSPITAL_COMMUNITY)
Admission: RE | Admit: 2018-08-08 | Discharge: 2018-08-08 | Disposition: A | Discharge status: Home / Self Care | Payer: Medicare Other | Source: Ambulatory Visit | Attending: Anesthesiology | Admitting: Anesthesiology

## 2018-08-08 ENCOUNTER — Encounter (HOSPITAL_COMMUNITY): Payer: Self-pay

## 2018-08-08 ENCOUNTER — Other Ambulatory Visit: Payer: Self-pay

## 2018-08-08 DIAGNOSIS — Z79899 Other long term (current) drug therapy: Secondary | ICD-10-CM | POA: Insufficient documentation

## 2018-08-08 DIAGNOSIS — I2510 Atherosclerotic heart disease of native coronary artery without angina pectoris: Secondary | ICD-10-CM | POA: Insufficient documentation

## 2018-08-08 DIAGNOSIS — Z7984 Long term (current) use of oral hypoglycemic drugs: Secondary | ICD-10-CM | POA: Diagnosis not present

## 2018-08-08 DIAGNOSIS — E785 Hyperlipidemia, unspecified: Secondary | ICD-10-CM | POA: Diagnosis not present

## 2018-08-08 DIAGNOSIS — Z955 Presence of coronary angioplasty implant and graft: Secondary | ICD-10-CM | POA: Insufficient documentation

## 2018-08-08 DIAGNOSIS — G4733 Obstructive sleep apnea (adult) (pediatric): Secondary | ICD-10-CM | POA: Diagnosis not present

## 2018-08-08 DIAGNOSIS — F329 Major depressive disorder, single episode, unspecified: Secondary | ICD-10-CM | POA: Diagnosis not present

## 2018-08-08 DIAGNOSIS — Z87891 Personal history of nicotine dependence: Secondary | ICD-10-CM | POA: Insufficient documentation

## 2018-08-08 DIAGNOSIS — E1142 Type 2 diabetes mellitus with diabetic polyneuropathy: Secondary | ICD-10-CM | POA: Diagnosis not present

## 2018-08-08 DIAGNOSIS — Z01812 Encounter for preprocedural laboratory examination: Secondary | ICD-10-CM | POA: Diagnosis not present

## 2018-08-08 DIAGNOSIS — Z9689 Presence of other specified functional implants: Secondary | ICD-10-CM | POA: Diagnosis not present

## 2018-08-08 DIAGNOSIS — F419 Anxiety disorder, unspecified: Secondary | ICD-10-CM | POA: Diagnosis not present

## 2018-08-08 DIAGNOSIS — Z7982 Long term (current) use of aspirin: Secondary | ICD-10-CM | POA: Diagnosis not present

## 2018-08-08 LAB — SURGICAL PCR SCREEN
MRSA, PCR: NEGATIVE
Staphylococcus aureus: POSITIVE — AB

## 2018-08-08 LAB — CBC
HCT: 41.6 % (ref 36.0–46.0)
Hemoglobin: 13.7 g/dL (ref 12.0–15.0)
MCH: 29 pg (ref 26.0–34.0)
MCHC: 32.9 g/dL (ref 30.0–36.0)
MCV: 87.9 fL (ref 80.0–100.0)
Platelets: 209 10*3/uL (ref 150–400)
RBC: 4.73 MIL/uL (ref 3.87–5.11)
RDW: 12.5 % (ref 11.5–15.5)
WBC: 6.5 10*3/uL (ref 4.0–10.5)
nRBC: 0 % (ref 0.0–0.2)

## 2018-08-08 LAB — BASIC METABOLIC PANEL
Anion gap: 8 (ref 5–15)
BUN: 26 mg/dL — ABNORMAL HIGH (ref 6–20)
CO2: 27 mmol/L (ref 22–32)
Calcium: 9.5 mg/dL (ref 8.9–10.3)
Chloride: 106 mmol/L (ref 98–111)
Creatinine, Ser: 0.93 mg/dL (ref 0.44–1.00)
GFR calc Af Amer: >60 mL/min (ref >60)
GFR calc non Af Amer: >60 mL/min (ref >60)
Glucose, Bld: 69 mg/dL — ABNORMAL LOW (ref 70–99)
Potassium: 4.2 mmol/L (ref 3.5–5.1)
Sodium: 141 mmol/L (ref 135–145)

## 2018-08-08 LAB — GLUCOSE, CAPILLARY
Glucose-Capillary: 58 mg/dL — ABNORMAL LOW (ref 70–99)
Glucose-Capillary: 97 mg/dL (ref 70–99)

## 2018-08-08 LAB — HEMOGLOBIN A1C
Hgb A1c MFr Bld: 5.9 % — ABNORMAL HIGH (ref 4.8–5.6)
Mean Plasma Glucose: 122.63 mg/dL

## 2018-08-08 MED FILL — Medication: INTRATHECAL | Qty: 1 | Status: AC

## 2018-08-08 NOTE — Progress Notes (Signed)
PCP: [name], PA-C Cardiologist: Dr. Marlou Porch sees yearly Pulmonologist: Dr. Jeanette Caprice  EKG: 04/07/2018 CXR: n/a ECHO: 08/2015 Stress Test: denies Cardiac Cath: 2012--@ [facility] Sleep study: [facility] >15 years ago  Fasting Blood Sugar- 100-120 Checks Blood Sugar daily  Last dose ASA: 08/08/2018  Instructed to bring CPAP mask/machine DOS.  Patient denies shortness of breath, fever, cough, and chest pain at PAT appointment.  Patient verbalized understanding of instructions provided today at the PAT appointment.  Patient asked to review instructions at home and day of surgery.   Pt forgot about PAT appt, pre-op phone call completed.  Coming in later today for blood work/PCR swab.  Written instructions (reviewed instructions over the phone as well) and CHG Soap to be provided.  Pt had questions regarding consent/procedure.  Instructed pt to call surgeon's office, pt wants to speak with MD prior to signing consent.   Chart forwarded to anesthesia for heart hx

## 2018-08-08 NOTE — Progress Notes (Signed)
Hypoglycemic Event  CBG: 58 @ 1303  Treatment: 8 oz juice/soda  Symptoms: Pale and Hungry  Follow-up CBG: Time:1327 CBG Result:97  Possible Reasons for Event: Inadequate meal intake  Comments/MD notified: Pt arrived for PAT lab work, no breakfast this morning.  Regular coke provided.  Reinforced education if CBG less than 70 DOS and how to treat.  Pt verbalized understanding.  Patient stated she felt better after soda and was going to go eat food after labs.

## 2018-08-08 NOTE — Progress Notes (Signed)
Surgical PCR +staph. Patient made aware, stated she had Mupirocin ointment at home already. Instructed on use. Verbalized understanding.

## 2018-08-11 NOTE — Progress Notes (Signed)
Anesthesia Chart Review:  Case:  [id number] Date/Time:  08/15/18 1225   Procedure:  INTRATHECAL PUMP REVISION (N/A ) - INTRATHECAL PUMP REVISION   Anesthesia type:  General   Pre-op diagnosis:  Presence of intrathecal pump   Location:  [facility] OR ROOM 18 / [place] [facility]   Surgeon:  Clydell Hakim, MD      DISCUSSION: 63 yo former smoker. Pertinent hx includes Fibromyalgia, CAD s/o stent to LAD 2009, Anxiety, WPW s/p ablation, OSA on CPAP, DMII, Peripheral neuropathy.  Follows with Dr. Marlou Porch for management of CAD s/p DES to LAD in 2009. Lat OV 02/10/2018. Per his note "Angina-well-controlled. She does have atypical sharp stabbing chest pains at times. Noncardiac. EKG reassuring.  No changes made.  She has extensive neck pain, back pain.  Scoliosis." Recommended 12 month f/u.  Anticipate she can proceed as planned barring acute status change.  VS: BP 128/67   Pulse 77   Temp 36.9 C   Resp 20   Ht 5\' 10"  (1.778 m)   Wt 100.7 kg   LMP  (LMP Unknown)   SpO2 95%   BMI 31.84 kg/m   PROVIDERS: Jearld Fenton, NP is pCP  Candee Furbish, MD is Cardiologist  LABS: Labs reviewed: Acceptable for surgery. (all labs ordered are listed, but only abnormal results are displayed)  Labs Reviewed  SURGICAL PCR SCREEN - Abnormal; Notable for the following components:      Result Value   Staphylococcus aureus POSITIVE (*)    All other components within normal limits  HEMOGLOBIN A1C - Abnormal; Notable for the following components:   Hgb A1c MFr Bld 5.9 (*)    All other components within normal limits  BASIC METABOLIC PANEL - Abnormal; Notable for the following components:   Glucose, Bld 69 (*)    BUN 26 (*)    All other components within normal limits  GLUCOSE, CAPILLARY - Abnormal; Notable for the following components:   Glucose-Capillary 58 (*)    All other components within normal limits  CBC  GLUCOSE, CAPILLARY    EKG: 04/17/2018: NSR. Rate 66.  CV: Echo 09/12/15:  - Left ventricle: The cavity  size was normal. Wall thickness was normal. Systolic function was vigorous. The estimated ejectionfraction was in the range of 65% to 70%. Wall motion was normal;there were no regional wall motion abnormalities. There was noevidence of elevated ventricular filling pressure by Dopplerparameters. - Left atrium: The atrium was mildly dilated.  Carotid duplex 06/28/15:  - Stable 40-59% bilateral ICA stenosis. - Stable >50% bilateral ECA stenosis. - Normal subclavian arteries, bilaterally. - Patent vertebral arteries with antegrade flow.  Cardiac cath 02/27/11:  1. Previously placed mid LAD drug-eluting stent in 2009 is widelypatent. There is only mild luminal irregularity at the diagonalbranch, which is covered by the stent. Otherwise, widely patentcoronary arteries. 2. Normal left ventricular ejection fraction of 60% with no wall motion abnormalities without any significant mitral regurgitation or aortic stenosis detected.  Past Medical History:  Diagnosis Date  . Amputation of great toe, right, traumatic ([place]) 05/30/2010  . Amputation of second toe, right, traumatic ([place]) 09/2017  . Anemia    years ago  . Anxiety   . Blue toes    2nd toe on right foot, will get appt.  . Bulging disc   . CAD (coronary artery disease) 2009   s/p stent to LAD  . 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  Ablations done.  Now has rare episodes  .  Chicken pox   . Chronic fatigue   . Chronic kidney disease    "problem with kidney filtration"  . Coronary arteritis   . Degenerative disc disease   . Depression   . Diabetes mellitus without complication ([place])   . Facet joint disease   . Fibromyalgia   . Heart disease   . Hyperlipidemia   . IBS (irritable bowel syndrome)   . MCL deficiency, knee   . MRSA (methicillin resistant staph aureus) culture positive 2011   GREAT TOE RIGHT FOOT  . Neuropathy 04/18/2010  . Peripheral neuropathy   . Restless leg syndrome   . Sepsis  ([place]) 09/2013  . Sleep apnea 08/17/2003   uses CPAP, sleep study at [facility] (mild to moderate)  . Spinal stenosis     Past Surgical History:  Procedure Laterality Date  . ABLATION     UTERUS  . ABLATION     HEART  . AMPUTATION TOE Right 02/01/2017   Procedure: AMPUTATION TOE-RIGHT 2ND MPJ;  Surgeon: Albertine Patricia, DPM;  Location: [facility] ORS;  Service: Podiatry;  Laterality: Right;  . ANTERIOR CERVICAL DECOMP/DISCECTOMY FUSION N/A 07/28/2014   Procedure: ANTERIOR CERVICAL DECOMPRESSION/DISCECTOMY FUSION CERVICAL 3-4,4-5,5-6 LEVELS WITH INSTRUMENTATION AND ALLOGRAFT;  Surgeon: Sinclair Ship, MD;  Location: [place];  Service: Orthopedics;  Laterality: N/A;  Anterior cervical decompression fusion, cervical 3-4, cervical 4-5, cervical 5-6 with instrumentation and allograft  . BACK SURGERY     X 3 1979, 1994, 1995  . CARPAL TUNNEL RELEASE Right   . CHOLECYSTECTOMY  2003  . CORONARY ANGIOPLASTY  2008  . EYE SURGERY Bilateral 2013   Eyelid lift   . FOOT SURGERY Right    BIG TOE  . FOOT SURGERY Bilateral    PLANTAR FASCIITIS  . FOOT SURGERY Right    2ND TOE  . GALLBLADDER SURGERY    . HAMMER TOE SURGERY Right 10/17/2017   Procedure: HAMMER TOE CORRECTION-4TH TOE;  Surgeon: Albertine Patricia, DPM;  Location: [place];  Service: Podiatry;  Laterality: Right;  LMA- WITH LOCAL Diabetic - diet controlled  . HAND SURGERY Left   . HAND SURGEY Left   . HEART STENT  2009   LAD  . INFUSION PUMP IMPLANTATION     X2 with morphine and baclofen  . INTRATHECAL PUMP REVISION N/A 04/25/2018   Procedure: Intrathecal pump replacement;  Surgeon: Clydell Hakim, MD;  Location: [place];  Service: Neurosurgery;  Laterality: N/A;  right  . INTRATHECAL PUMP REVISION Right 04/25/2018  . IRRIGATION AND DEBRIDEMENT FOOT Right 02/23/2017   Procedure: IRRIGATION AND DEBRIDEMENT FOOT-right foot;  Surgeon: Samara Deist, DPM;  Location: [facility] ORS;  Service: Podiatry;  Laterality: Right;  . TOE  SURGERY     then revision 8/18    MEDICATIONS: . ACCU-CHEK AVIVA PLUS test strip  . ACCU-CHEK SOFTCLIX LANCETS lancets  . AMBULATORY NON FORMULARY MEDICATION  . aspirin 81 MG tablet  . buPROPion (WELLBUTRIN XL) 150 MG 24 hr tablet  . Cinnamon 500 MG TABS  . Coenzyme Q10 (CO Q 10) 100 MG CAPS  . Diclofenac Sodium (PENNSAID) 2 % SOLN  . glipiZIDE (GLUCOTROL XL) 2.5 MG 24 hr tablet  . glucosamine-chondroitin 500-400 MG tablet  . Magnesium 250 MG TABS  . Misc Natural Products (TART CHERRY ADVANCED PO)  . Multiple Vitamin (MULTIVITAMIN) tablet  . naloxone [facility]) 2 MG/2ML injection  . nitroGLYCERIN (NITROSTAT) 0.4 MG SL tablet  . NONFORMULARY OR COMPOUNDED ITEM  . NONFORMULARY OR COMPOUNDED ITEM  .  NONFORMULARY OR COMPOUNDED ITEM  . Omega-3 Fatty Acids (FISH OIL) 1000 MG CAPS  . [START ON 10/11/2018] oxyCODONE (OXY IR/ROXICODONE) 5 MG immediate release tablet  . [START ON 09/11/2018] oxyCODONE (OXY IR/ROXICODONE) 5 MG immediate release tablet  . [START ON 08/12/2018] oxyCODONE (OXY IR/ROXICODONE) 5 MG immediate release tablet  . PARoxetine (PAXIL) 20 MG tablet  . polyethylene glycol (MIRALAX / GLYCOLAX) packet  . pregabalin (LYRICA) 100 MG capsule  . rosuvastatin (CRESTOR) 20 MG tablet  . tiZANidine (ZANAFLEX) 4 MG tablet  . Turmeric 500 MG TABS  . Vitamin D, Ergocalciferol, (DRISDOL) 1.25 MG (50000 UT) CAPS capsule  . VITAMIN E PO   No current facility-administered medications for this encounter.      [facility]/Anesthesiology Phone [phone] 08/11/2018 9:30 AM

## 2018-08-11 NOTE — Anesthesia Preprocedure Evaluation (Addendum)
Anesthesia Evaluation  Patient identified by MRN, date of birth, ID band Patient awake    Reviewed: Allergy & Precautions, NPO status , Patient's Chart, lab work & pertinent test results  Airway Mallampati: III  TM Distance: >3 FB Neck ROM: Full    Dental no notable dental hx. (+) Teeth Intact   Pulmonary sleep apnea and Continuous Positive Airway Pressure Ventilation , former smoker,    Pulmonary exam normal breath sounds clear to auscultation       Cardiovascular hypertension, Pt. on medications + CAD and + Cardiac Stents  Normal cardiovascular exam+ dysrhythmias  Rhythm:Regular Rate:Normal  Hx/o WPW s/p ablation DES x 1 2009   Neuro/Psych PSYCHIATRIC DISORDERS Anxiety Depression Restless legs syndrome  Neuromuscular disease    GI/Hepatic negative GI ROS, Neg liver ROS,   Endo/Other  diabetes, Well Controlled, Type 2, Oral Hypoglycemic AgentsHyperlipidemia  Renal/GU Renal disease  negative genitourinary   Musculoskeletal  (+) Arthritis , Osteoarthritis,  Fibromyalgia -Chronic back and neck pain Failed cervical fusion Lumbar spinal stenosis Lumbar facet hypertrophy Lumbar foraminal stenosis Intrathecal pump in place Scoliosis   Abdominal (+) + obese,   Peds  Hematology  (+) anemia ,   Anesthesia Other Findings   Reproductive/Obstetrics                            Anesthesia Physical Anesthesia Plan  ASA: III  Anesthesia Plan: General   Post-op Pain Management:    Induction: Intravenous  PONV Risk Score and Plan: 4 or greater and Ondansetron, Dexamethasone and Treatment may vary due to age or medical condition  Airway Management Planned: Oral ETT  Additional Equipment:   Intra-op Plan:   Post-operative Plan: Extubation in OR  Informed Consent: I have reviewed the patients History and Physical, chart, labs and discussed the procedure including the risks, benefits and  alternatives for the proposed anesthesia with the patient or authorized representative who has indicated his/her understanding and acceptance.     Dental advisory given  Plan Discussed with: CRNA and Surgeon  Anesthesia Plan Comments: (See PAT note by Karoline Caldwell, PA-C )       Anesthesia Quick Evaluation

## 2018-08-12 ENCOUNTER — Telehealth: Payer: Self-pay | Admitting: Pain Medicine

## 2018-08-12 NOTE — Telephone Encounter (Signed)
Yasmine from Dr. Urbano Heir called stating Dr. Urbano Heir would like to speak with Dr. Dossie Arbour today about Marzell's upcoming surgery on Friday.    Dr. Urbano Heir [phone]

## 2018-08-12 NOTE — Telephone Encounter (Signed)
Dr Naveira notified.  

## 2018-08-14 NOTE — H&P (Signed)
Melinda [name] is an 63 y.o. female.   Chief Complaint: "My pump is flipping" HPI:  Patient with a longstanding history of intrathecal drug therapy,  And his pump I replaced approximately 5 months ago due to battery life time exhaustion return to my clinic recently noting that she had lost a fairly substantial amount of weight, and now her pump was quite mobile and she was able to manipulated so that was potentially flipping.  She is in the process of trying loses substantial more amount of weight, and I encouraged her to leave the pump alone, and address any revision when she was at her new baseline weight.  Nonetheless, after conferring with her pain management provider, she wanted to get the pump fixed so that she did not have to worry about it flipping.  We scheduled the procedure accordingly.  In the interval, she is noted that she has fairly substantial back pain where the catheter fixation device is fairly superficial in her back.  We are going to attempt to tunnel the fixation devices slightly while she is under anesthesia, without having to undergo substantial catheter replacement or revision  Past Medical History:  Diagnosis Date  . Amputation of great toe, right, traumatic ([place]) 05/30/2010  . Amputation of second toe, right, traumatic ([place]) 09/2017  . Anemia    years ago  . Anxiety   . Blue toes    2nd toe on right foot, will get appt.  . Bulging disc   . CAD (coronary artery disease) 2009   s/p stent to LAD  . 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  Ablations done.  Now has rare episodes  . Chicken pox   . Chronic fatigue   . Chronic kidney disease    "problem with kidney filtration"  . Coronary arteritis   . Degenerative disc disease   . Depression   . Diabetes mellitus without complication ([place])   . Facet joint disease   . Fibromyalgia   . Heart disease   . Hyperlipidemia   . IBS (irritable bowel syndrome)   . MCL deficiency, knee   . MRSA (methicillin  resistant staph aureus) culture positive 2011   GREAT TOE RIGHT FOOT  . Neuropathy 04/18/2010  . Peripheral neuropathy   . Restless leg syndrome   . Sepsis ([place]) 09/2013  . Sleep apnea 08/17/2003   uses CPAP, sleep study at [facility] (mild to moderate)  . Spinal stenosis     Past Surgical History:  Procedure Laterality Date  . ABLATION     UTERUS  . ABLATION     HEART  . AMPUTATION TOE Right 02/01/2017   Procedure: AMPUTATION TOE-RIGHT 2ND MPJ;  Surgeon: Albertine Patricia, DPM;  Location: [facility];  Service: Podiatry;  Laterality: Right;  . ANTERIOR CERVICAL DECOMP/DISCECTOMY FUSION N/A 07/28/2014   Procedure: ANTERIOR CERVICAL DECOMPRESSION/DISCECTOMY FUSION CERVICAL 3-4,4-5,5-6 LEVELS WITH INSTRUMENTATION AND ALLOGRAFT;  Surgeon: Sinclair Ship, MD;  Location: [place];  Service: Orthopedics;  Laterality: N/A;  Anterior cervical decompression fusion, cervical 3-4, cervical 4-5, cervical 5-6 with instrumentation and allograft  . BACK SURGERY     X 3 1979, 1994, 1995  . CARPAL TUNNEL RELEASE Right   . CHOLECYSTECTOMY  2003  . CORONARY ANGIOPLASTY  2008  . EYE SURGERY Bilateral 2013   Eyelid lift   . FOOT SURGERY Right    BIG TOE  . FOOT SURGERY Bilateral    PLANTAR FASCIITIS  . FOOT SURGERY Right  2ND TOE  . GALLBLADDER SURGERY    . HAMMER TOE SURGERY Right 10/17/2017   Procedure: HAMMER TOE CORRECTION-4TH TOE;  Surgeon: Albertine Patricia, DPM;  Location: [place];  Service: Podiatry;  Laterality: Right;  LMA- WITH LOCAL Diabetic - diet controlled  . HAND SURGERY Left   . HAND SURGEY Left   . HEART STENT  2009   LAD  . INFUSION PUMP IMPLANTATION     X2 with morphine and baclofen  . INTRATHECAL PUMP REVISION N/A 04/25/2018   Procedure: Intrathecal pump replacement;  Surgeon: Clydell Hakim, MD;  Location: [place];  Service: Neurosurgery;  Laterality: N/A;  right  . INTRATHECAL PUMP REVISION Right 04/25/2018  . IRRIGATION AND DEBRIDEMENT FOOT Right  02/23/2017   Procedure: IRRIGATION AND DEBRIDEMENT FOOT-right foot;  Surgeon: Samara Deist, DPM;  Location: [facility];  Service: Podiatry;  Laterality: Right;  . TOE SURGERY     then revision 8/18    Family History  Problem Relation Age of Onset  . Lung cancer Mother   . Diabetes Father   . Heart disease Maternal Grandfather   . Diabetes Paternal Grandmother   . Colon cancer Paternal Grandfather   . Stroke Neg Hx    Social History:  reports that she quit smoking about 11 years ago. Her smoking use included cigarettes. She has a 48.00 pack-year smoking history. She has never used smokeless tobacco. She reports current alcohol use. She reports that she does not use drugs.  Allergies: No Known Allergies  Medications Prior to Admission  Medication Sig Dispense Refill  . aspirin 81 MG tablet Take 81 mg by mouth daily.    Marland Kitchen buPROPion (WELLBUTRIN XL) 150 MG 24 hr tablet Take 1 tablet (150 mg total) by mouth daily. MUST SCHEDULE ANNUAL EXAM 90 tablet 3  . Cinnamon 500 MG TABS Take 1,000 mg by mouth daily.    . Coenzyme Q10 (CO Q 10) 100 MG CAPS Take 100 mg by mouth daily.     . Diclofenac Sodium (PENNSAID) 2 % SOLN Place 1 application onto the skin daily as needed (knee pain).    Marland Kitchen glipiZIDE (GLUCOTROL XL) 2.5 MG 24 hr tablet Take 1 tablet (2.5 mg total) by mouth daily with breakfast. 90 tablet 0  . glucosamine-chondroitin 500-400 MG tablet Take 2 tablets by mouth daily.     . Magnesium 250 MG TABS Take 250 mg by mouth daily.    . Misc Natural Products (TART CHERRY ADVANCED PO) Take 1,200 mg by mouth at bedtime.     . Multiple Vitamin (MULTIVITAMIN) tablet Take 1 tablet by mouth daily.    . NONFORMULARY OR COMPOUNDED ITEM 147.95 mcg by Epidural Infusion route Continuous EPIDURAL. Medtronic Neuromodulation pump Fentanyl 147.95 mcg, Baclofen, bupivicaine    . NONFORMULARY OR COMPOUNDED ITEM 88.77 mcg by Epidural Infusion route Continuous EPIDURAL. Medtronic Neuromodulation pump: Fentanyl,  baclofen 88.77, bupivicane    . NONFORMULARY OR COMPOUNDED ITEM 5.918 mg by Epidural Infusion route Continuous EPIDURAL. Medtronic Neuromodulation pump: Fentanyl, Baclofen, Bupivicaine 5.918 mg    . Omega-3 Fatty Acids (FISH OIL) 1000 MG CAPS Take 2,000 mg by mouth 2 (two) times daily.     Derrill Memo ON 10/11/2018] oxyCODONE (OXY IR/ROXICODONE) 5 MG immediate release tablet Take 1 tablet (5 mg total) by mouth every 8 (eight) hours as needed for up to 30 days for severe pain. 90 tablet 0  . [START ON 09/11/2018] oxyCODONE (OXY IR/ROXICODONE) 5 MG immediate release tablet Take 1 tablet (5 mg total)  by mouth every 8 (eight) hours as needed for up to 30 days for severe pain. 90 tablet 0  . oxyCODONE (OXY IR/ROXICODONE) 5 MG immediate release tablet Take 1 tablet (5 mg total) by mouth every 8 (eight) hours as needed for up to 30 days for severe pain. 90 tablet 0  . PARoxetine (PAXIL) 20 MG tablet TAKE ONE-HALF OF A TABLET BY MOUTH DAILY IN THE MORNING (Patient taking differently: Take 10 mg by mouth daily. TAKE ONE-HALF OF A TABLET BY MOUTH DAILY IN THE MORNING) 90 tablet 0  . polyethylene glycol (MIRALAX / GLYCOLAX) packet Take 17 g by mouth daily.     . pregabalin (LYRICA) 100 MG capsule Take 1 capsule (100 mg total) by mouth 3 (three) times daily. 270 capsule 0  . rosuvastatin (CRESTOR) 20 MG tablet Take 1 tablet (20 mg total) by mouth daily. 30 tablet 11  . tiZANidine (ZANAFLEX) 4 MG tablet Take 1 tablet (4 mg total) by mouth 3 (three) times daily. 90 tablet 2  . Turmeric 500 MG TABS Take 500 mg by mouth 2 (two) times daily.     Marland Kitchen VITAMIN E PO Take 450 Units by mouth daily.     Marland Kitchen ACCU-CHEK AVIVA PLUS test strip TEST 1 TIME DAILY **E11.9** 200 each 2  . ACCU-CHEK SOFTCLIX LANCETS lancets Use to check blood sugar 1 time daily.  Dx: E11.42 100 each 2  . AMBULATORY NON FORMULARY MEDICATION Medication Name: CPAP MASK OF CHOICE FOR HOME DEVICE 1 each 0  . naloxone (NARCAN) 2 MG/2ML injection Inject 1 mL (1 mg  total) into the muscle as needed for up to 2 doses (for opioid overdose). Inject content of syringe into thigh muscle. Call 911. 2 Syringe 1  . nitroGLYCERIN (NITROSTAT) 0.4 MG SL tablet Place 1 tablet (0.4 mg total) under the tongue every 5 (five) minutes as needed for chest pain. 25 tablet prn  . Vitamin D, Ergocalciferol, (DRISDOL) 1.25 MG (50000 UT) CAPS capsule TAKE 1 CAPSULE BY MOUTH EVERY 7 DAYS. (Patient not taking: Reported on 08/01/2018) 12 capsule 0    Results for orders placed or performed during the hospital encounter of 08/15/18 (from the past 48 hour(s))  Glucose, capillary     Status: Abnormal   Collection Time: 08/15/18 10:14 AM  Result Value Ref Range   Glucose-Capillary 139 (H) 70 - 99 mg/dL   Comment 1 Notify RN    Comment 2 Document in Chart    No results found.  Review of Systems  Constitutional: Negative.   HENT: Negative.   Eyes: Negative.   Respiratory: Negative.   Cardiovascular: Negative.   Gastrointestinal: Negative.   Genitourinary: Negative.   Musculoskeletal: Positive for back pain. Negative for falls and myalgias.  Skin: Negative.   Neurological: Negative.   Endo/Heme/Allergies: Negative.   Psychiatric/Behavioral: Negative.     Blood pressure (!) 125/48, pulse 69, temperature 98.2 F (36.8 C), temperature source Oral, resp. rate 20, height 5\' 10"  (1.778 m), weight 100.7 kg, SpO2 94 %. Physical Exam  Constitutional: She is oriented to person, place, and time. She appears well-developed and well-nourished.  HENT:  Head: Normocephalic and atraumatic.  Eyes: Pupils are equal, round, and reactive to light. Conjunctivae are normal.  Neck: Normal range of motion.  Cardiovascular: Normal rate.  Respiratory: Effort normal.  GI: Soft.  Musculoskeletal: Normal range of motion.  Neurological: She is alert and oriented to person, place, and time.  Skin: Skin is warm and dry.  Psychiatric: She has  a normal mood and affect. Her behavior is normal. Thought  content normal.     Assessment/Plan   Chronic pain syndrome, withintrathecal pump mobility requiring revision; We are going to attempt to tunnel the fixation devices slightly while she is under anesthesia, without having to undergo substantial catheter replacement or revision  Bonna Gains, MD 08/15/2018, 11:22 AM

## 2018-08-15 ENCOUNTER — Ambulatory Visit (HOSPITAL_COMMUNITY): Payer: Medicare Other | Admitting: Certified Registered Nurse Anesthetist

## 2018-08-15 ENCOUNTER — Ambulatory Visit (HOSPITAL_COMMUNITY): Payer: Medicare Other | Admitting: Physician Assistant

## 2018-08-15 ENCOUNTER — Encounter (HOSPITAL_COMMUNITY)
Admission: RE | Disposition: A | Discharge status: Home / Self Care | Payer: Self-pay | Source: Ambulatory Visit | Attending: Anesthesiology

## 2018-08-15 ENCOUNTER — Ambulatory Visit (HOSPITAL_COMMUNITY)
Admission: RE | Admit: 2018-08-15 | Discharge: 2018-08-15 | Disposition: A | Discharge status: Home / Self Care | Payer: Medicare Other | Source: Ambulatory Visit | Attending: Anesthesiology | Admitting: Anesthesiology

## 2018-08-15 ENCOUNTER — Encounter (HOSPITAL_COMMUNITY): Payer: Self-pay | Admitting: *Deleted

## 2018-08-15 DIAGNOSIS — X58XXXA Exposure to other specified factors, initial encounter: Secondary | ICD-10-CM | POA: Insufficient documentation

## 2018-08-15 DIAGNOSIS — Z8614 Personal history of Methicillin resistant Staphylococcus aureus infection: Secondary | ICD-10-CM | POA: Diagnosis not present

## 2018-08-15 DIAGNOSIS — E1122 Type 2 diabetes mellitus with diabetic chronic kidney disease: Secondary | ICD-10-CM | POA: Insufficient documentation

## 2018-08-15 DIAGNOSIS — I2510 Atherosclerotic heart disease of native coronary artery without angina pectoris: Secondary | ICD-10-CM | POA: Insufficient documentation

## 2018-08-15 DIAGNOSIS — E785 Hyperlipidemia, unspecified: Secondary | ICD-10-CM | POA: Diagnosis not present

## 2018-08-15 DIAGNOSIS — Z89421 Acquired absence of other right toe(s): Secondary | ICD-10-CM | POA: Insufficient documentation

## 2018-08-15 DIAGNOSIS — T85890A Other specified complication of nervous system prosthetic devices, implants and grafts, initial encounter: Secondary | ICD-10-CM | POA: Insufficient documentation

## 2018-08-15 DIAGNOSIS — G894 Chronic pain syndrome: Secondary | ICD-10-CM | POA: Insufficient documentation

## 2018-08-15 DIAGNOSIS — F329 Major depressive disorder, single episode, unspecified: Secondary | ICD-10-CM | POA: Insufficient documentation

## 2018-08-15 DIAGNOSIS — I129 Hypertensive chronic kidney disease with stage 1 through stage 4 chronic kidney disease, or unspecified chronic kidney disease: Secondary | ICD-10-CM | POA: Insufficient documentation

## 2018-08-15 DIAGNOSIS — T859XXA Unspecified complication of internal prosthetic device, implant and graft, initial encounter: Secondary | ICD-10-CM | POA: Diagnosis not present

## 2018-08-15 DIAGNOSIS — G2581 Restless legs syndrome: Secondary | ICD-10-CM | POA: Insufficient documentation

## 2018-08-15 DIAGNOSIS — I456 Pre-excitation syndrome: Secondary | ICD-10-CM | POA: Insufficient documentation

## 2018-08-15 DIAGNOSIS — Z955 Presence of coronary angioplasty implant and graft: Secondary | ICD-10-CM | POA: Diagnosis not present

## 2018-08-15 DIAGNOSIS — Z7984 Long term (current) use of oral hypoglycemic drugs: Secondary | ICD-10-CM | POA: Insufficient documentation

## 2018-08-15 DIAGNOSIS — F419 Anxiety disorder, unspecified: Secondary | ICD-10-CM | POA: Insufficient documentation

## 2018-08-15 DIAGNOSIS — E669 Obesity, unspecified: Secondary | ICD-10-CM | POA: Diagnosis not present

## 2018-08-15 DIAGNOSIS — Z87891 Personal history of nicotine dependence: Secondary | ICD-10-CM | POA: Diagnosis not present

## 2018-08-15 DIAGNOSIS — Z89411 Acquired absence of right great toe: Secondary | ICD-10-CM | POA: Insufficient documentation

## 2018-08-15 DIAGNOSIS — M797 Fibromyalgia: Secondary | ICD-10-CM | POA: Diagnosis not present

## 2018-08-15 DIAGNOSIS — E1140 Type 2 diabetes mellitus with diabetic neuropathy, unspecified: Secondary | ICD-10-CM | POA: Diagnosis not present

## 2018-08-15 DIAGNOSIS — Z79899 Other long term (current) drug therapy: Secondary | ICD-10-CM | POA: Insufficient documentation

## 2018-08-15 DIAGNOSIS — R5382 Chronic fatigue, unspecified: Secondary | ICD-10-CM | POA: Diagnosis not present

## 2018-08-15 DIAGNOSIS — K589 Irritable bowel syndrome without diarrhea: Secondary | ICD-10-CM | POA: Insufficient documentation

## 2018-08-15 DIAGNOSIS — N189 Chronic kidney disease, unspecified: Secondary | ICD-10-CM | POA: Diagnosis not present

## 2018-08-15 DIAGNOSIS — Z7982 Long term (current) use of aspirin: Secondary | ICD-10-CM | POA: Insufficient documentation

## 2018-08-15 DIAGNOSIS — Z6831 Body mass index (BMI) 31.0-31.9, adult: Secondary | ICD-10-CM | POA: Insufficient documentation

## 2018-08-15 DIAGNOSIS — Z981 Arthrodesis status: Secondary | ICD-10-CM | POA: Insufficient documentation

## 2018-08-15 HISTORY — PX: PAIN PUMP REVISION: SHX6231

## 2018-08-15 LAB — GLUCOSE, CAPILLARY: Glucose-Capillary: 139 mg/dL — ABNORMAL HIGH (ref 70–99)

## 2018-08-15 SURGERY — PAIN PUMP REVISION
Anesthesia: General

## 2018-08-15 MED ORDER — ONDANSETRON HCL 4 MG/2ML IJ SOLN: 4.0000 mg | Freq: Once | INTRAMUSCULAR | Status: DC | PRN

## 2018-08-15 MED ORDER — CEFAZOLIN SODIUM-DEXTROSE 2-4 GM/100ML-% IV SOLN
2.0000 g | INTRAVENOUS | Status: AC
  Administered 2018-08-15: 2 g via INTRAVENOUS

## 2018-08-15 MED ORDER — BUPIVACAINE-EPINEPHRINE (PF) 0.25% -1:200000 IJ SOLN
INTRAMUSCULAR | Status: AC
  Filled 2018-08-15: qty 10

## 2018-08-15 MED ORDER — ONDANSETRON HCL 4 MG/2ML IJ SOLN
INTRAMUSCULAR | Status: DC | PRN
  Administered 2018-08-15: 4 mg via INTRAVENOUS

## 2018-08-15 MED ORDER — LACTATED RINGERS IV SOLN
INTRAVENOUS | Status: DC | PRN
  Administered 2018-08-15 (×2): via INTRAVENOUS

## 2018-08-15 MED ORDER — EPHEDRINE 5 MG/ML INJ
INTRAVENOUS | Status: AC
  Filled 2018-08-15: qty 20

## 2018-08-15 MED ORDER — ARTIFICIAL TEARS OPHTHALMIC OINT
TOPICAL_OINTMENT | OPHTHALMIC | Status: AC
  Filled 2018-08-15: qty 3.5

## 2018-08-15 MED ORDER — LACTATED RINGERS IV SOLN
INTRAVENOUS | Status: DC
  Administered 2018-08-15: 11:00:00 via INTRAVENOUS

## 2018-08-15 MED ORDER — SUCCINYLCHOLINE CHLORIDE 200 MG/10ML IV SOSY
PREFILLED_SYRINGE | INTRAVENOUS | Status: AC
  Filled 2018-08-15: qty 10

## 2018-08-15 MED ORDER — PHENYLEPHRINE 40 MCG/ML (10ML) SYRINGE FOR IV PUSH (FOR BLOOD PRESSURE SUPPORT)
PREFILLED_SYRINGE | INTRAVENOUS | Status: AC
  Filled 2018-08-15: qty 20

## 2018-08-15 MED ORDER — HYDROMORPHONE HCL 1 MG/ML IJ SOLN
INTRAMUSCULAR | Status: AC
  Filled 2018-08-15: qty 1

## 2018-08-15 MED ORDER — ROCURONIUM BROMIDE 100 MG/10ML IV SOLN
INTRAVENOUS | Status: DC | PRN
  Administered 2018-08-15: 50 mg via INTRAVENOUS

## 2018-08-15 MED ORDER — PROPOFOL 10 MG/ML IV BOLUS
INTRAVENOUS | Status: AC
  Filled 2018-08-15: qty 20

## 2018-08-15 MED ORDER — ONDANSETRON HCL 4 MG/2ML IJ SOLN
INTRAMUSCULAR | Status: AC
  Filled 2018-08-15: qty 4

## 2018-08-15 MED ORDER — MIDAZOLAM HCL 5 MG/5ML IJ SOLN
INTRAMUSCULAR | Status: DC | PRN
  Administered 2018-08-15: 2 mg via INTRAVENOUS

## 2018-08-15 MED ORDER — HYDROMORPHONE HCL 1 MG/ML IJ SOLN
0.2500 mg | INTRAMUSCULAR | Status: DC | PRN
  Administered 2018-08-15 (×2): 0.5 mg via INTRAVENOUS

## 2018-08-15 MED ORDER — ROCURONIUM BROMIDE 50 MG/5ML IV SOSY
PREFILLED_SYRINGE | INTRAVENOUS | Status: AC
  Filled 2018-08-15: qty 5

## 2018-08-15 MED ORDER — DEXAMETHASONE SODIUM PHOSPHATE 10 MG/ML IJ SOLN
INTRAMUSCULAR | Status: AC
  Filled 2018-08-15: qty 2

## 2018-08-15 MED ORDER — LIDOCAINE 2% (20 MG/ML) 5 ML SYRINGE
INTRAMUSCULAR | Status: AC
  Filled 2018-08-15: qty 5

## 2018-08-15 MED ORDER — BUPIVACAINE-EPINEPHRINE (PF) 0.5% -1:200000 IJ SOLN
INTRAMUSCULAR | Status: DC | PRN
  Administered 2018-08-15: 2 mL via PERINEURAL

## 2018-08-15 MED ORDER — OXYCODONE HCL 5 MG PO TABS
5.0000 mg | ORAL_TABLET | Freq: Once | ORAL | Status: AC
  Administered 2018-08-15: 5 mg via ORAL

## 2018-08-15 MED ORDER — FENTANYL CITRATE (PF) 250 MCG/5ML IJ SOLN
INTRAMUSCULAR | Status: DC | PRN
  Administered 2018-08-15: 25 ug via INTRAVENOUS
  Administered 2018-08-15: 50 ug via INTRAVENOUS
  Administered 2018-08-15: 100 ug via INTRAVENOUS
  Administered 2018-08-15: 25 ug via INTRAVENOUS
  Administered 2018-08-15: 50 ug via INTRAVENOUS

## 2018-08-15 MED ORDER — BACITRACIN ZINC 500 UNIT/GM EX OINT
TOPICAL_OINTMENT | CUTANEOUS | Status: AC
  Filled 2018-08-15: qty 28.35

## 2018-08-15 MED ORDER — BUPIVACAINE HCL (PF) 0.25 % IJ SOLN
INTRAMUSCULAR | Status: DC | PRN
  Administered 2018-08-15: 10 mL

## 2018-08-15 MED ORDER — BUPIVACAINE HCL (PF) 0.25 % IJ SOLN
INTRAMUSCULAR | Status: AC
  Filled 2018-08-15: qty 30

## 2018-08-15 MED ORDER — FENTANYL CITRATE (PF) 250 MCG/5ML IJ SOLN
INTRAMUSCULAR | Status: AC
  Filled 2018-08-15: qty 5

## 2018-08-15 MED ORDER — OXYCODONE-ACETAMINOPHEN 10-325 MG PO TABS: 1.0000 | ORAL_TABLET | ORAL | 0 refills | Status: DC | PRN

## 2018-08-15 MED ORDER — CHLORHEXIDINE GLUCONATE CLOTH 2 % EX PADS: 6.0000 | MEDICATED_PAD | Freq: Once | CUTANEOUS | Status: DC

## 2018-08-15 MED ORDER — OXYCODONE-ACETAMINOPHEN 5-325 MG PO TABS
1.0000 | ORAL_TABLET | Freq: Once | ORAL | Status: AC
  Administered 2018-08-15: 1 via ORAL

## 2018-08-15 MED ORDER — OXYCODONE HCL 5 MG PO TABS: 5.0000 mg | ORAL_TABLET | ORAL | 0 refills | Status: DC | PRN

## 2018-08-15 MED ORDER — OXYCODONE-ACETAMINOPHEN 5-325 MG PO TABS
ORAL_TABLET | ORAL | Status: AC
  Filled 2018-08-15: qty 1

## 2018-08-15 MED ORDER — MIDAZOLAM HCL 2 MG/2ML IJ SOLN
INTRAMUSCULAR | Status: AC
  Filled 2018-08-15: qty 2

## 2018-08-15 MED ORDER — 0.9 % SODIUM CHLORIDE (POUR BTL) OPTIME
TOPICAL | Status: DC | PRN
  Administered 2018-08-15: 1000 mL

## 2018-08-15 MED ORDER — OXYCODONE HCL 5 MG PO TABS
ORAL_TABLET | ORAL | Status: AC
  Filled 2018-08-15: qty 1

## 2018-08-15 MED ORDER — PROPOFOL 10 MG/ML IV BOLUS
INTRAVENOUS | Status: DC | PRN
  Administered 2018-08-15: 200 mg via INTRAVENOUS

## 2018-08-15 MED ORDER — EPHEDRINE SULFATE 50 MG/ML IJ SOLN
INTRAMUSCULAR | Status: DC | PRN
  Administered 2018-08-15: 5 mg via INTRAVENOUS

## 2018-08-15 MED ORDER — PHENYLEPHRINE HCL 10 MG/ML IJ SOLN
INTRAMUSCULAR | Status: DC | PRN
  Administered 2018-08-15 (×2): 80 ug via INTRAVENOUS

## 2018-08-15 MED ORDER — LIDOCAINE HCL (CARDIAC) PF 100 MG/5ML IV SOSY
PREFILLED_SYRINGE | INTRAVENOUS | Status: DC | PRN
  Administered 2018-08-15: 60 mg via INTRAVENOUS

## 2018-08-15 MED ORDER — HYDROCODONE-ACETAMINOPHEN 7.5-325 MG PO TABS: 1.0000 | ORAL_TABLET | Freq: Once | ORAL | Status: DC | PRN

## 2018-08-15 MED ORDER — SODIUM CHLORIDE 0.9 % IV SOLN
INTRAVENOUS | Status: DC | PRN
  Administered 2018-08-15: 11:00:00

## 2018-08-15 MED ORDER — MEPERIDINE HCL 50 MG/ML IJ SOLN: 6.2500 mg | INTRAMUSCULAR | Status: DC | PRN

## 2018-08-15 MED ORDER — SODIUM CHLORIDE 0.9 % IV SOLN
INTRAVENOUS | Status: DC | PRN
  Administered 2018-08-15: 25 ug/min via INTRAVENOUS

## 2018-08-15 MED ORDER — CEFAZOLIN SODIUM-DEXTROSE 2-4 GM/100ML-% IV SOLN
INTRAVENOUS | Status: AC
  Filled 2018-08-15: qty 100

## 2018-08-15 MED ORDER — DEXAMETHASONE SODIUM PHOSPHATE 4 MG/ML IJ SOLN
INTRAMUSCULAR | Status: DC | PRN
  Administered 2018-08-15: 5 mg via INTRAVENOUS

## 2018-08-15 MED ORDER — SUGAMMADEX SODIUM 200 MG/2ML IV SOLN
INTRAVENOUS | Status: DC | PRN
  Administered 2018-08-15: 201.4 mg via INTRAVENOUS

## 2018-08-15 SURGICAL SUPPLY — 52 items
ADH SKN CLS APL DERMABOND .7 (GAUZE/BANDAGES/DRESSINGS) ×2
BAG DECANTER FOR FLEXI CONT (MISCELLANEOUS) ×2 IMPLANT
BINDER ABDOMINAL 12 ML 46-62 (SOFTGOODS) ×1 IMPLANT
BLADE CLIPPER SURG (BLADE) IMPLANT
BOOT SUTURE AID YELLOW STND (SUTURE) IMPLANT
CHLORAPREP W/TINT 26ML (MISCELLANEOUS) ×2 IMPLANT
COVER WAND RF STERILE (DRAPES) ×2 IMPLANT
DERMABOND ADVANCED (GAUZE/BANDAGES/DRESSINGS) ×2
DERMABOND ADVANCED .7 DNX12 (GAUZE/BANDAGES/DRESSINGS) IMPLANT
DRAPE LAPAROTOMY 100X72X124 (DRAPES) ×2 IMPLANT
DRAPE POUCH INSTRU U-SHP 10X18 (DRAPES) ×2 IMPLANT
DRAPE SURG 17X23 STRL (DRAPES) ×8 IMPLANT
DRSG COVADERM 4X8 (GAUZE/BANDAGES/DRESSINGS) ×2 IMPLANT
DRSG OPSITE POSTOP 4X6 (GAUZE/BANDAGES/DRESSINGS) ×1 IMPLANT
ELECT REM PT RETURN 9FT ADLT (ELECTROSURGICAL) ×2
ELECTRODE REM PT RTRN 9FT ADLT (ELECTROSURGICAL) ×1 IMPLANT
GAUZE 4X4 16PLY RFD (DISPOSABLE) IMPLANT
GLOVE BIOGEL PI IND STRL 7.5 (GLOVE) IMPLANT
GLOVE BIOGEL PI INDICATOR 7.5 (GLOVE) ×2
GLOVE ECLIPSE 7.5 STRL STRAW (GLOVE) ×3 IMPLANT
GLOVE EXAM NITRILE LRG STRL (GLOVE) IMPLANT
GLOVE EXAM NITRILE XL STR (GLOVE) IMPLANT
GLOVE EXAM NITRILE XS STR PU (GLOVE) IMPLANT
GLOVE SURG SS PI 7.0 STRL IVOR (GLOVE) ×3 IMPLANT
GOWN STRL REUS W/ TWL LRG LVL3 (GOWN DISPOSABLE) IMPLANT
GOWN STRL REUS W/ TWL XL LVL3 (GOWN DISPOSABLE) IMPLANT
GOWN STRL REUS W/TWL 2XL LVL3 (GOWN DISPOSABLE) IMPLANT
GOWN STRL REUS W/TWL LRG LVL3 (GOWN DISPOSABLE) ×4
GOWN STRL REUS W/TWL XL LVL3 (GOWN DISPOSABLE)
KIT BASIN OR (CUSTOM PROCEDURE TRAY) ×2 IMPLANT
KIT TURNOVER KIT B (KITS) ×2 IMPLANT
NDL 18GX1X1/2 (RX/OR ONLY) (NEEDLE) IMPLANT
NDL HYPO 25X1 1.5 SAFETY (NEEDLE) ×1 IMPLANT
NEEDLE 18GX1X1/2 (RX/OR ONLY) (NEEDLE) IMPLANT
NEEDLE HYPO 25X1 1.5 SAFETY (NEEDLE) ×2 IMPLANT
NS IRRIG 1000ML POUR BTL (IV SOLUTION) ×2 IMPLANT
PACK LAMINECTOMY NEURO (CUSTOM PROCEDURE TRAY) ×2 IMPLANT
PAD ARMBOARD 7.5X6 YLW CONV (MISCELLANEOUS) ×2 IMPLANT
POUCH TYRX ANTIBAC NEURO LRG (Mesh General) IMPLANT
POUCH TYRX NEURO LRG (Mesh General) ×2 IMPLANT
SPONGE LAP 4X18 RFD (DISPOSABLE) IMPLANT
SPONGE SURGIFOAM ABS GEL SZ50 (HEMOSTASIS) IMPLANT
STAPLER SKIN PROX WIDE 3.9 (STAPLE) ×2 IMPLANT
SUT MNCRL AB 3-0 PS2 18 (SUTURE) ×2 IMPLANT
SUT MNCRL AB 4-0 PS2 18 (SUTURE) ×2 IMPLANT
SUT SILK 2 0 PERMA HAND 18 BK (SUTURE) ×4 IMPLANT
SUT VIC AB 2-0 CP2 18 (SUTURE) ×3 IMPLANT
SYR 3ML LL SCALE MARK (SYRINGE) ×2 IMPLANT
TOWEL GREEN STERILE (TOWEL DISPOSABLE) ×2 IMPLANT
TOWEL GREEN STERILE FF (TOWEL DISPOSABLE) ×2 IMPLANT
WATER STERILE IRR 1000ML POUR (IV SOLUTION) ×2 IMPLANT
YANKAUER SUCT BULB TIP NO VENT (SUCTIONS) ×2 IMPLANT

## 2018-08-15 NOTE — Anesthesia Procedure Notes (Signed)

## 2018-08-15 NOTE — Discharge Instructions (Addendum)
Dr. Maryjean Ka Post-Op Orders   Ice Pack - 20 minutes on (in a pillow case), and 20 minutes off. Wear the ice pack UNDER the binder.  Follow up in office, they will call you for an appointment in 10 days to 2 weeks.  Increase activity gradually.    No lifting anything heavier than a gallon of milk (10 pounds) until seen in the office.  Advance diet slowly as tolerated.  Dressing care:  Keep dressing dry for 3 days, and on Post-op day 4, may shower.  Call for fever, drainage, and redness.  No swimming or bathing in a bathtub (do not get into standing water).   Post Anesthesia Home Care Instructions  Activity: Get plenty of rest for the remainder of the day. A responsible individual must stay with you for 24 hours following the procedure.  For the next 24 hours, DO NOT: -Drive a car -Paediatric nurse -Drink alcoholic beverages -Take any medication unless instructed by your physician -Make any legal decisions or sign important papers.  Meals: Start with liquid foods such as gelatin or soup. Progress to regular foods as tolerated. Avoid greasy, spicy, heavy foods. If nausea and/or vomiting occur, drink only clear liquids until the nausea and/or vomiting subsides. Call your physician if vomiting continues.  Special Instructions/Symptoms: Your throat may feel dry or sore from the anesthesia or the breathing tube placed in your throat during surgery. If this causes discomfort, gargle with warm salt water. The discomfort should disappear within 24 hours.  If you had a scopolamine patch placed behind your ear for the management of post- operative nausea and/or vomiting:  1. The medication in the patch is effective for 72 hours, after which it should be removed.  Wrap patch in a tissue and discard in the trash. Wash hands thoroughly with soap and water. 2. You may remove the patch earlier than 72 hours if you experience unpleasant side effects which may include dry mouth, dizziness or  visual disturbances. 3. Avoid touching the patch. Wash your hands with soap and water after contact with the patch.

## 2018-08-15 NOTE — Transfer of Care (Signed)
Immediate Anesthesia Transfer of Care Note  Patient: Melinda [name]  Procedure(s) Performed: Intrathecal PUMP REVISION (N/A )  Patient Location: PACU  Anesthesia Type:General  Level of Consciousness: awake, alert , oriented and patient cooperative  Airway & Oxygen Therapy: Patient Spontanous Breathing and Patient connected to face mask oxygen  Post-op Assessment: Report given to RN and Post -op Vital signs reviewed and stable  Post vital signs: Reviewed and stable  Last Vitals:  Vitals Value Taken Time  BP 133/78 08/15/2018  2:23 PM  Temp    Pulse 82 08/15/2018  2:24 PM  Resp 10 08/15/2018  2:24 PM  SpO2 100 % 08/15/2018  2:24 PM  Vitals shown include unvalidated device data.  Last Pain:  Vitals:   08/15/18 1043  TempSrc:   PainSc: 0-No pain      Patients Stated Pain Goal: 2 (58/06/38 [id number])  Complications: No apparent anesthesia complications

## 2018-08-15 NOTE — Op Note (Signed)
PREOP DX: chronic pain treated with intrathecal drug therapy; pain at catheter anchor site POSTOP DX: same PROCEDURE: revision of IT pump; revision of catheter SURGEON: Sheva Mcdougle ASSISTANT: none ANESTHESIA: General, endotracheal FINDINGS: twisted catheter in pump pocket, very superficial lumbar catheteranchor EBL: minimal DESCRIPTION OF PROCEDURE: Patient was brought to the operating room andanesthesia induced by the anesthesia team. Patient was positioned, padded. Left lower abdominal quadrant and thoracolumbar area was prepped withchloraprepscrub and draped.Timeout taken.  Area of previous incisions were infiltrated with marcaine. Prior incision was reopened and the old pain pump was externalized.Excised fat and scar tissue to the abdominal fascia. 0 silk sutures were placed in the fascia, to correspond to the 4 attachment "corners" on the pump. The new pump was then internalized with the tip in the 12 o'clock position, care taken to secure the pump with each of the 0 silk sutures without incorporating the catheter. Wound was irrigated, and a antibiotic eluting patch placed in the pocket.  Attention was then turned to the patient's lumbar spine, where she had a palpable anchor subcutaneous, slightly lateral to her previous incision site.  A careful incision was made with a 10 blade, dissected carefully until the anchor itself was identified.  The anchor was carefully freed, and we were able to withdraw in a distal to proximal fashion about 2 cm of catheter without placing any strain on the catheter itself.  Dissected deeper along the medial border of the incision until I located some dorsal lumbar fascia.  An 0 silk suture was placed into the dorsal lumbar fascia, and used to fix the anchor to it.   Incisions were irrigated copiously and closed with 1-0 Vicryl andthe skin closed with a 3-0 monocryl and dermabondand dressed with a sterile occlusive dressing.Pump was noted to run a  simple continuous infusion with fentanyl as the driving drug, at 675 mcg/day.   COMPLICATIONS: none CONDITION: stable during procedure, and to PACU DISPOSITION: home with additional pain medication. F/u 2 weeks for wound check.

## 2018-08-15 NOTE — Anesthesia Postprocedure Evaluation (Signed)
Anesthesia Post Note  Patient: Melinda [name]  Procedure(s) Performed: Intrathecal PUMP REVISION (N/A )     Patient location during evaluation: PACU Anesthesia Type: General Level of consciousness: awake and alert and oriented Pain management: pain level controlled Vital Signs Assessment: post-procedure vital signs reviewed and stable Respiratory status: spontaneous breathing, nonlabored ventilation and respiratory function stable Cardiovascular status: blood pressure returned to baseline and stable Postop Assessment: no apparent nausea or vomiting Anesthetic complications: no    Last Vitals:  Vitals:   08/15/18 1439 08/15/18 1452  BP: (!) 145/74 (!) 142/75  Pulse: 76   Resp: 13   Temp:    SpO2: 98%     Last Pain:  Vitals:   08/15/18 1452  TempSrc:   PainSc: 4                  [name] A.

## 2018-08-18 ENCOUNTER — Telehealth: Payer: Self-pay | Admitting: Pain Medicine

## 2018-08-18 ENCOUNTER — Encounter (HOSPITAL_COMMUNITY): Payer: Self-pay | Admitting: Anesthesiology

## 2018-08-18 NOTE — Telephone Encounter (Signed)
Pt requests a nurse to call her back asap. Pt states that she had her pump revision surgery Friday and she thinks that she can hear fluid around the pocket area where her pump is. Pt states that she is scared. I asked if pt had contacted her surgeon and she stated that she has not she would rather talk to a nurse here.

## 2018-08-18 NOTE — Telephone Encounter (Signed)
Instructed patient to call her surgeon that revised pump. Patient states she would do so.

## 2018-08-25 ENCOUNTER — Telehealth: Payer: Self-pay | Admitting: *Deleted

## 2018-08-25 NOTE — Telephone Encounter (Signed)
Patient called to let us know that she got a script for pain medicine from [name] Oxycodone/acetaminophne 10/325 mg #42.  Patient states that the fluid around the pump was normal.

## 2018-08-28 ENCOUNTER — Other Ambulatory Visit: Payer: Self-pay | Admitting: Internal Medicine

## 2018-08-29 ENCOUNTER — Other Ambulatory Visit: Payer: Self-pay | Admitting: Internal Medicine

## 2018-09-02 ENCOUNTER — Encounter: Payer: Self-pay | Admitting: Internal Medicine

## 2018-09-02 ENCOUNTER — Ambulatory Visit (INDEPENDENT_AMBULATORY_CARE_PROVIDER_SITE_OTHER): Payer: Medicare Other | Admitting: Internal Medicine

## 2018-09-02 DIAGNOSIS — I2583 Coronary atherosclerosis due to lipid rich plaque: Secondary | ICD-10-CM

## 2018-09-02 DIAGNOSIS — E7800 Pure hypercholesterolemia, unspecified: Secondary | ICD-10-CM | POA: Diagnosis not present

## 2018-09-02 DIAGNOSIS — F329 Major depressive disorder, single episode, unspecified: Secondary | ICD-10-CM

## 2018-09-02 DIAGNOSIS — F419 Anxiety disorder, unspecified: Secondary | ICD-10-CM | POA: Diagnosis not present

## 2018-09-02 DIAGNOSIS — G4733 Obstructive sleep apnea (adult) (pediatric): Secondary | ICD-10-CM | POA: Diagnosis not present

## 2018-09-02 DIAGNOSIS — E1141 Type 2 diabetes mellitus with diabetic mononeuropathy: Secondary | ICD-10-CM

## 2018-09-02 DIAGNOSIS — G894 Chronic pain syndrome: Secondary | ICD-10-CM | POA: Diagnosis not present

## 2018-09-02 DIAGNOSIS — N183 Chronic kidney disease, stage 3 unspecified: Secondary | ICD-10-CM

## 2018-09-02 DIAGNOSIS — M797 Fibromyalgia: Secondary | ICD-10-CM

## 2018-09-02 DIAGNOSIS — F32A Depression, unspecified: Secondary | ICD-10-CM

## 2018-09-02 DIAGNOSIS — I158 Other secondary hypertension: Secondary | ICD-10-CM

## 2018-09-02 DIAGNOSIS — I2510 Atherosclerotic heart disease of native coronary artery without angina pectoris: Secondary | ICD-10-CM

## 2018-09-02 MED ORDER — PAROXETINE HCL 20 MG PO TABS: ORAL_TABLET | ORAL | 0 refills | Status: DC

## 2018-09-02 NOTE — Progress Notes (Signed)
Subjective:    Patient ID: Melinda [name], female    DOB: October 09, 1955, 63 y.o.   MRN: [id number]  HPI  Pt presents to the clinic today to follow up chronic conditions.  OSA:  She averages 4-8 hours of sleep with use of her CPAP. Sleep study from 2005 reviewed.  DM 2 with Peripheral Neuropathy: Her last A1C was 5.9%, 2/220. Her sugars range 90-151. She denies hypoglycemia. She is taking Glipizide and Lyrica as prescribed. Her last eye exam was within the last year. She checks her feet routinely. Flu and pneumovax UTD.  CKD 3: Her last GFR was 53.82, normal creatinine. She is not on ACEI/ARB. Urine microalbumin from 10/2017 reviewed.  CAD with HLD: Her last LDL was 63, 10/2017. She denies chest pain. She is taking Fish Oil, CoQ10 and Rosuvastatin as prescribed. She tries to consume a low fat diet.   Chronic Pain, Fibromyalgia: She has an intrathecal pump that she just had revised. She takes Oxycodone and Zanaflex as needed for pain.  Depression: Chronic but stable on Paxil and Wellbutrin. She denies anxiety, SI/HI.  Review of Systems  Past Medical History:  Diagnosis Date  . 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 Amputation of second toe, right, traumatic ([place]) 09/2017  . Anemia    years ago  . Anxiety   . Blue toes    2nd toe on right foot, will get appt.  . Bulging disc   . CAD (coronary artery disease) 2009   s/p stent to LAD  . 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  Ablations done.  Now has rare episodes  . Chicken pox   . Chronic fatigue   . Chronic kidney disease    "problem with kidney filtration"  . Coronary arteritis   . Degenerative disc disease   . Depression   . Diabetes mellitus without complication ([place])   . Facet joint disease   . Fibromyalgia   . Heart disease   . Hyperlipidemia   . IBS (irritable bowel syndrome)   . MCL deficiency, knee   . MRSA (methicillin resistant staph aureus) culture positive 2011   GREAT TOE RIGHT FOOT  . Neuropathy 04/18/2010  . Peripheral neuropathy   . Restless leg syndrome   . Sepsis ([place]) 09/2013  . Sleep apnea 08/17/2003   uses CPAP, sleep [facility] (mild to moderate)  . Spinal stenosis     Current Outpatient Medications  Medication Sig Dispense Refill  . ACCU-CHEK AVIVA PLUS test strip TEST 1 TIME DAILY **E11.9** 200 each 2  . ACCU-CHEK SOFTCLIX LANCETS lancets USE TO CHECK BLOOD SUGAR 1 TIME DAILY. DX: E11.42 100 each 2  . AMBULATORY NON FORMULARY MEDICATION Medication Name: CPAP MASK OF CHOICE FOR HOME DEVICE 1 each 0  . aspirin 81 MG tablet Take 81 mg by mouth daily.    Marland Kitchen buPROPion (WELLBUTRIN XL) 150 MG 24 hr tablet Take 1 tablet (150 mg total) by mouth daily. 90 tablet 0  . Cinnamon 500 MG TABS Take 1,000 mg by mouth daily.    . Coenzyme Q10 (CO Q 10) 100 MG CAPS Take 100 mg by mouth daily.     . Diclofenac Sodium (PENNSAID) 2 % SOLN Place 1 application onto the skin daily as needed (knee pain).    Marland Kitchen glipiZIDE (GLUCOTROL XL) 2.5 MG 24 hr tablet Take 1 tablet (2.5 mg total) by mouth daily with breakfast. SCHEDULE ANNUAL EXAM 90 tablet 0  .  glucosamine-chondroitin 500-400 MG tablet Take 2 tablets by mouth daily.     . Magnesium 250 MG TABS Take 250 mg by mouth daily.    . Misc Natural Products (TART CHERRY ADVANCED PO) Take 1,200 mg by mouth at bedtime.     . Multiple Vitamin (MULTIVITAMIN) tablet Take 1 tablet by mouth daily.    . naloxone (NARCAN) 2 MG/2ML injection Inject 1 mL (1 mg total) into the muscle as needed for up to 2 doses (for opioid overdose). Inject content of syringe into thigh muscle. Call 911. 2 Syringe 1  . nitroGLYCERIN (NITROSTAT) 0.4 MG SL tablet Place 1 tablet (0.4 mg total) under the tongue every 5 (five) minutes as needed for chest pain. 25 tablet prn  . NONFORMULARY OR COMPOUNDED ITEM 147.95 mcg by Epidural Infusion route Continuous EPIDURAL. Medtronic Neuromodulation pump Fentanyl 147.95 mcg, Baclofen,  bupivicaine    . NONFORMULARY OR COMPOUNDED ITEM 88.77 mcg by Epidural Infusion route Continuous EPIDURAL. Medtronic Neuromodulation pump: Fentanyl, baclofen 88.77, bupivicane    . NONFORMULARY OR COMPOUNDED ITEM 5.918 mg by Epidural Infusion route Continuous EPIDURAL. Medtronic Neuromodulation pump: Fentanyl, Baclofen, Bupivicaine 5.918 mg    . Omega-3 Fatty Acids (FISH OIL) 1000 MG CAPS Take 2,000 mg by mouth 2 (two) times daily.     Derrill Memo ON 09/11/2018] oxyCODONE (OXY IR/ROXICODONE) 5 MG immediate release tablet Take 1 tablet (5 mg total) by mouth every 8 (eight) hours as needed for up to 30 days for severe pain. 90 tablet 0  . oxyCODONE (OXY IR/ROXICODONE) 5 MG immediate release tablet Take 1 tablet (5 mg total) by mouth every 8 (eight) hours as needed for up to 30 days for severe pain. 90 tablet 0  . PARoxetine (PAXIL) 20 MG tablet TAKE ONE-HALF OF A TABLET BY MOUTH DAILY IN THE MORNING (Patient taking differently: Take 10 mg by mouth daily. TAKE ONE-HALF OF A TABLET BY MOUTH DAILY IN THE MORNING) 90 tablet 0  . polyethylene glycol (MIRALAX / GLYCOLAX) packet Take 17 g by mouth daily.     . pregabalin (LYRICA) 100 MG capsule Take 1 capsule (100 mg total) by mouth 3 (three) times daily. 270 capsule 0  . rosuvastatin (CRESTOR) 20 MG tablet Take 1 tablet (20 mg total) by mouth daily. 30 tablet 11  . tiZANidine (ZANAFLEX) 4 MG tablet Take 1 tablet (4 mg total) by mouth 3 (three) times daily. 90 tablet 2  . Turmeric 500 MG TABS Take 500 mg by mouth 2 (two) times daily.     Marland Kitchen VITAMIN E PO Take 450 Units by mouth daily.     Marland Kitchen oxyCODONE (OXY IR/ROXICODONE) 5 MG immediate release tablet Take 1 tablet (5 mg total) by mouth every 4 (four) hours as needed for up to 7 days (postoperative pain). 42 tablet 0   No current facility-administered medications for this visit.     No Known Allergies  Family History  Problem Relation Age of Onset  . Lung cancer Mother   . Diabetes Father   . Heart disease  Maternal Grandfather   . Diabetes Paternal Grandmother   . Colon cancer Paternal Grandfather   . Stroke Neg Hx     Social History   Socioeconomic History  . Marital status: Significant Other    Spouse name: Not on file  . Number of children: Not on file  . Years of education: Not on file  . Highest education level: Not on file  Occupational History  . Not on file  Social Needs  . Financial resource strain: Not on file  . Food insecurity:    Worry: Not on file    Inability: Not on file  . Transportation needs:    Medical: Not on file    Non-medical: Not on file  Tobacco Use  . Smoking status: Former Smoker    Packs/day: 1.50    Years: 32.00    Pack years: 48.00    Types: Cigarettes    Last attempt to quit: 07/22/2007    Years since quitting: 11.1  . Smokeless tobacco: Never Used  Substance and Sexual Activity  . Alcohol use: Yes    Alcohol/week: 0.0 standard drinks    Comment: occ - Holidays  . Drug use: No  . Sexual activity: Yes  Lifestyle  . Physical activity:    Days per week: Not on file    Minutes per session: Not on file  . Stress: Not on file  Relationships  . Social connections:    Talks on phone: Not on file    Gets together: Not on file    Attends religious service: Not on file    Active member of club or organization: Not on file    Attends meetings of clubs or organizations: Not on file    Relationship status: Not on file  . Intimate partner violence:    Fear of current or ex partner: Not on file    Emotionally abused: Not on file    Physically abused: Not on file    Forced sexual activity: Not on file  Other Topics Concern  . Not on file  Social History Narrative   Lives at home with a partner. Independent at baseline     Constitutional: Pt reports fatigue. Denies fever, malaise, headache or abrupt weight changes.  Respiratory: Denies difficulty breathing, shortness of breath, cough or sputum production.   Cardiovascular: Denies chest  pain, chest tightness, palpitations or swelling in the hands or feet.  Musculoskeletal: Pt reports chronic joint and muscle pain. Denies decrease in range of motion, difficulty with gait, or joint swelling.  Skin: Denies redness, rashes, lesions or ulcercations.  Neurological: Pt reports numbness and tingling in her hands and feet. Denies dizziness, difficulty with memory, difficulty with speech or problems with balance and coordination.  Psych: Pt has a history of depression. Denies anxiety, SI/HI.  No other specific complaints in a complete review of systems (except as listed in HPI above).     Objective:   Physical Exam  BP 134/82   Pulse 66   Temp 97.8 F (36.6 C) (Oral)   Wt 223 lb (101.2 kg)   LMP  (LMP Unknown)   SpO2 97%   BMI 32.00 kg/m   Wt Readings from Last 3 Encounters:  08/15/18 221 lb 14.4 oz (100.7 kg)  08/08/18 221 lb 14.4 oz (100.7 kg)  08/06/18 215 lb (97.5 kg)    General: Appears her stated age, obese, in NAD. Skin: Warm, dry and intact. No  ulcerations noted. Cardiovascular: Normal rate and rhythm. S1,S2 noted.  No murmur, rubs or gallops noted. No JVD or BLE edema. No carotid bruits noted. Pulmonary/Chest: Normal effort and positive vesicular breath sounds. No respiratory distress. No wheezes, rales or ronchi noted.  Musculoskeletal:  No difficulty with gait.  Neurological: Alert and oriented. Sensation intact but decreased to BLE. Psychiatric: Mood and affect normal. Behavior is normal. Judgment and thought content normal.     BMET    Component Value Date/Time   NA  141 08/08/2018 1302   NA 135 (L) 06/30/2013 1203   K 4.2 08/08/2018 1302   K 4.1 06/30/2013 1203   CL 106 08/08/2018 1302   CL 102 06/30/2013 1203   CO2 27 08/08/2018 1302   CO2 32 06/30/2013 1203   GLUCOSE 69 (L) 08/08/2018 1302   GLUCOSE 143 (H) 06/30/2013 1203   BUN 26 (H) 08/08/2018 1302   BUN 19 (H) 06/30/2013 1203   CREATININE 0.93 08/08/2018 1302   CREATININE 0.89  06/30/2013 1203   CALCIUM 9.5 08/08/2018 1302   CALCIUM 9.7 06/30/2013 1203   GFRNONAA >60 08/08/2018 1302   GFRNONAA >60 06/30/2013 1203   GFRAA >60 08/08/2018 1302   GFRAA >60 06/30/2013 1203    Lipid Panel     Component Value Date/Time   CHOL 130 11/04/2017 1326   TRIG 285.0 (H) 11/04/2017 1326   HDL 37.50 (L) 11/04/2017 1326   CHOLHDL 3 11/04/2017 1326   VLDL 57.0 (H) 11/04/2017 1326   LDLCALC 62 09/11/2015 0421    CBC    Component Value Date/Time   WBC 6.5 08/08/2018 1302   RBC 4.73 08/08/2018 1302   HGB 13.7 08/08/2018 1302   HGB 14.0 11/12/2011 1136   HCT 41.6 08/08/2018 1302   HCT 40.7 11/12/2011 1136   PLT 209 08/08/2018 1302   PLT 274 11/12/2011 1136   MCV 87.9 08/08/2018 1302   MCV 86 11/12/2011 1136   MCH 29.0 08/08/2018 1302   MCHC 32.9 08/08/2018 1302   RDW 12.5 08/08/2018 1302   RDW 12.8 11/12/2011 1136   LYMPHSABS 1.8 08/14/2017 1950   LYMPHSABS 2.5 11/12/2011 1136   MONOABS 0.6 08/14/2017 1950   MONOABS 0.8 11/12/2011 1136   EOSABS 0.3 08/14/2017 1950   EOSABS 0.6 11/12/2011 1136   BASOSABS 0.0 08/14/2017 1950   BASOSABS 0.1 11/12/2011 1136    Hgb A1C Lab Results  Component Value Date   HGBA1C 5.9 (H) 08/08/2018            Assessment & Plan:

## 2018-09-04 ENCOUNTER — Encounter: Payer: Self-pay | Admitting: Internal Medicine

## 2018-09-04 DIAGNOSIS — N183 Chronic kidney disease, stage 3 unspecified: Secondary | ICD-10-CM | POA: Insufficient documentation

## 2018-09-04 NOTE — Assessment & Plan Note (Signed)
Controlled with intrathecal pump, Oxycodone and Zanaflex She will continue to follow with pain clinic

## 2018-09-04 NOTE — Assessment & Plan Note (Addendum)
No A1C today Microalbumin from 10/2017 reviewed Encouraged her to consume a low carb diet and exercise for weight loss Encouraged yearly eye exam Lyrica for neuropathic pain Will stop Glipizide- monitor sugars, let me know if rising Flu and pneumovax UTD

## 2018-09-04 NOTE — Assessment & Plan Note (Signed)
Will repeat CMET and Lipid profile at annual exam Continue CoQ10, Fish Oil and Rosuvastatin Encouraged her to consume a low fat diet .

## 2018-09-04 NOTE — Assessment & Plan Note (Signed)
Borderline off meds Continue to monitor for now Reinforced DASH diet and exercise for weight loss If sustained > 140/90, will add ACEI/ARB

## 2018-09-04 NOTE — Assessment & Plan Note (Signed)
No angina Continue ASA, CoQ10, Fish Oil and Rosuvastatin

## 2018-09-04 NOTE — Patient Instructions (Signed)
Carbohydrate Counting for Diabetes Mellitus, Adult  Carbohydrate counting is a method of keeping track of how many carbohydrates you eat. Eating carbohydrates naturally increases the amount of sugar (glucose) in the blood. Counting how many carbohydrates you eat helps keep your blood glucose within normal limits, which helps you manage your diabetes (diabetes mellitus). It is important to know how many carbohydrates you can safely have in each meal. This is different for every person. A diet and nutrition specialist (registered dietitian) can help you make a meal plan and calculate how many carbohydrates you should have at each meal and snack. Carbohydrates are found in the following foods:  Grains, such as breads and cereals.  Dried beans and soy products.  Starchy vegetables, such as potatoes, peas, and corn.  Fruit and fruit juices.  Milk and yogurt.  Sweets and snack foods, such as cake, cookies, candy, chips, and soft drinks. How do I count carbohydrates? There are two ways to count carbohydrates in food. You can use either of the methods or a combination of both. Reading "Nutrition Facts" on packaged food The "Nutrition Facts" list is included on the labels of almost all packaged foods and beverages in the [place] It includes:  The serving size.  Information about nutrients in each serving, including the grams (g) of carbohydrate per serving. To use the "Nutrition Facts":  Decide how many servings you will have.  Multiply the number of servings by the number of carbohydrates per serving.  The resulting number is the total amount of carbohydrates that you will be having. Learning standard serving sizes of other foods When you eat carbohydrate foods that are not packaged or do not include "Nutrition Facts" on the label, you need to measure the servings in order to count the amount of carbohydrates:  Measure the foods that you will eat with a food scale or measuring cup, if needed.   Decide how many standard-size servings you will eat.  Multiply the number of servings by 15. Most carbohydrate-rich foods have about 15 g of carbohydrates per serving. ? For example, if you eat 8 oz (170 g) of strawberries, you will have eaten 2 servings and 30 g of carbohydrates (2 servings x 15 g = 30 g).  For foods that have more than one food mixed, such as soups and casseroles, you must count the carbohydrates in each food that is included. The following list contains standard serving sizes of common carbohydrate-rich foods. Each of these servings has about 15 g of carbohydrates:   hamburger bun or  English muffin.   oz (15 mL) syrup.   oz (14 g) jelly.  1 slice of bread.  1 six-inch tortilla.  3 oz (85 g) cooked rice or pasta.  4 oz (113 g) cooked dried beans.  4 oz (113 g) starchy vegetable, such as peas, corn, or potatoes.  4 oz (113 g) hot cereal.  4 oz (113 g) mashed potatoes or  of a large baked potato.  4 oz (113 g) canned or frozen fruit.  4 oz (120 mL) fruit juice.  4-6 crackers.  6 chicken nuggets.  6 oz (170 g) unsweetened dry cereal.  6 oz (170 g) plain fat-free yogurt or yogurt sweetened with artificial sweeteners.  8 oz (240 mL) milk.  8 oz (170 g) fresh fruit or one small piece of fruit.  24 oz (680 g) popped popcorn. Example of carbohydrate counting Sample meal  3 oz (85 g) chicken breast.  6 oz (170 g)   brown rice.  4 oz (113 g) corn.  8 oz (240 mL) milk.  8 oz (170 g) strawberries with sugar-free whipped topping. Carbohydrate calculation 1. Identify the foods that contain carbohydrates: ? Rice. ? Corn. ? Milk. ? Strawberries. 2. Calculate how many servings you have of each food: ? 2 servings rice. ? 1 serving corn. ? 1 serving milk. ? 1 serving strawberries. 3. Multiply each number of servings by 15 g: ? 2 servings rice x 15 g = 30 g. ? 1 serving corn x 15 g = 15 g. ? 1 serving milk x 15 g = 15 g. ? 1 serving  strawberries x 15 g = 15 g. 4. Add together all of the amounts to find the total grams of carbohydrates eaten: ? 30 g + 15 g + 15 g + 15 g = 75 g of carbohydrates total. Summary  Carbohydrate counting is a method of keeping track of how many carbohydrates you eat.  Eating carbohydrates naturally increases the amount of sugar (glucose) in the blood.  Counting how many carbohydrates you eat helps keep your blood glucose within normal limits, which helps you manage your diabetes.  A diet and nutrition specialist (registered dietitian) can help you make a meal plan and calculate how many carbohydrates you should have at each meal and snack. This information is not intended to replace advice given to you by your health care provider. Make sure you discuss any questions you have with your health care provider. Document Released: 06/18/2005 Document Revised: 12/26/2016 Document Reviewed: 11/30/2015 Elsevier Interactive Patient Education  2019 [employer]  

## 2018-09-04 NOTE — Assessment & Plan Note (Signed)
Stable on Paxil and Wellbutrin Paxil refilled today Support offered today

## 2018-09-04 NOTE — Assessment & Plan Note (Signed)
Discussed getting an updated sleep study but she declines at this time Encouraged weight loss Continue CPAP  Will monitor

## 2018-09-04 NOTE — Assessment & Plan Note (Signed)
Pain controlled with intrathecal pump, Oxycodone and Zanaflex She will continue to follow with the pain clinic

## 2018-09-04 NOTE — Assessment & Plan Note (Signed)
Monitor for now Consider adding ACEI/ARB if BP > 140/90

## 2018-09-09 ENCOUNTER — Ambulatory Visit: Payer: Medicare Other | Admitting: Family Medicine

## 2018-10-06 ENCOUNTER — Encounter: Payer: Self-pay | Admitting: Pain Medicine

## 2018-10-07 ENCOUNTER — Encounter: Payer: Self-pay | Admitting: Pain Medicine

## 2018-10-08 ENCOUNTER — Encounter: Payer: Self-pay | Admitting: Pain Medicine

## 2018-10-09 ENCOUNTER — Telehealth: Payer: Self-pay

## 2018-10-09 NOTE — Telephone Encounter (Signed)
Please schedule a virtual visit with [name] regarding increased pain.  Call patient to notify her of the appointment please. Thank you

## 2018-10-12 NOTE — Progress Notes (Signed)
Pain Management Virtual Encounter Note - Virtual Visit via Telephone Telehealth (real-time audio visits between healthcare provider and patient).  Patient's Phone No. & Preferred Pharmacy:  [phone] (home); [phone] (mobile); (Preferred) [phone]  [facility], [place] [facility] [place] [name] [place] [address] Phone: [phone] Fax: [phone]  [id number] [address] [place] - [address] [place] [address] Phone: [phone] Fax: [phone]   Pre-screening note:  Our staff contacted Melinda [name] and offered her an "in person", "face-to-face" appointment versus a telephone encounter. She indicated preferring the telephone encounter, at this time.  Reason for Virtual Visit: COVID-19*  Social distancing based on CDC and AMA recommendations.   I contacted Melinda [name] on 10/13/2018 at 1:25 PM by telephone and clearly identified myself as Gaspar Cola, MD. I verified that I was speaking with the correct person using two identifiers (Name and date of birth: [birth year]).  Advanced Informed Consent I sought verbal advanced consent from Melinda [name] for telemedicine interactions and virtual visit. I informed Melinda [name] of the security and privacy concerns, risks, and limitations associated with performing an evaluation and management service by telephone. I also informed Melinda [name] of the availability of "in person" appointments and I informed her of the possibility of a patient responsible charge related to this service. Melinda [name] expressed understanding and agreed to proceed.   Historic Elements   Melinda [name] is a 63 y.o. year old, female patient evaluated today after her last encounter by our practice on 10/09/2018. Melinda [name]  has a past medical history of Amputation of great toe, right, traumatic ([place]) (05/30/2010), Amputation of second toe, right, traumatic ([place]) (09/2017), Anemia, Anxiety,  Blue toes, Bulging disc, CAD (coronary artery disease) (2009), Cardiac arrhythmia due to congenital heart disease, Chicken pox, Chronic fatigue, Chronic kidney disease, Coronary arteritis, Degenerative disc disease, Depression, Diabetes mellitus without complication ([place]), Facet joint disease, Fibromyalgia, Heart disease, Hyperlipidemia, IBS (irritable bowel syndrome), MCL deficiency, knee, MRSA (methicillin resistant staph aureus) culture positive (2011), Neuropathy (04/18/2010), Peripheral neuropathy, Restless leg syndrome, Sepsis ([place]) (09/2013), Sleep apnea (08/17/2003), and Spinal stenosis. She also  has a past surgical history that includes Foot surgery (Right); Hand surgery (Left); Gallbladder surgery; Ablation; Ablation; HEART STENT (2009); HAND SURGEY (Left); Foot surgery (Bilateral); Foot surgery (Right); Infusion pump implantation; Back surgery; Cholecystectomy (2003); Anterior cervical decomp/discectomy fusion (N/A, 07/28/2014); Carpal tunnel release (Right); Eye surgery (Bilateral, 2013); Amputation toe (Right, 02/01/2017); Irrigation and debridement foot (Right, 02/23/2017); Toe Surgery; Hammer toe surgery (Right, 10/17/2017); Intrathecal pump revision (N/A, 04/25/2018); Intrathecal pump revision (Right, 04/25/2018); Coronary angioplasty (2008); and Pain pump revision (N/A, 08/15/2018). Melinda [name] has a current medication list which includes the following prescription(s): accu-chek aviva plus, accu-chek softclix lancets, AMBULATORY NON FORMULARY MEDICATION, aspirin, bupropion, cinnamon, co q 10, diclofenac sodium, glipizide, glucosamine-chondroitin, magnesium, misc natural products, multivitamin, naloxone, nitroglycerin, NONFORMULARY OR COMPOUNDED ITEM, NONFORMULARY OR COMPOUNDED ITEM, NONFORMULARY OR COMPOUNDED ITEM, fish oil, oxycodone, oxycodone, oxycodone, paroxetine, polyethylene glycol, pregabalin, rosuvastatin, tizanidine, turmeric, and vitamin e. She  reports that she quit smoking about 11 years  ago. Her smoking use included cigarettes. She has a 48.00 pack-year smoking history. She has never used smokeless tobacco. She reports current alcohol use. She reports that she does not use drugs. Melinda [name] has No Known Allergies.   HPI  I last saw her on 08/18/2018. She is being evaluated for worsening of previously established problem.  The patient describes having a flareup of her  low back pain, specifically and only on the right side.  This pain travels to the area of the buttocks and as the day goes by it goes further down to the area behind the knee and then into the calf area but does not go into her feet.  Her recent lumbar MRI confirms the patient to have bilateral lumbar facet hypertrophy affecting most levels in the lumbar spine.  The patient was asked to stand up and do hyperextension and rotation at home when to relate to [place] whether or not this was aggravating her symptoms and she confirmed that it was.  I will be bringing the patient in to have a diagnostic right-sided lumbar facet block under fluoroscopic guidance and IV sedation on the same day that she has to come in for her intrathecal pump refill.  Today I have refilled all of her medications.  Pharmacotherapy Assessment  Analgesic: Oxycodone 5 mg 1 tablet p.o. every 8 hours (15 mg/day of oxycodone) MME/day: 22.5 mg/day.   Monitoring: Pharmacotherapy: No side-effects or adverse reactions reported. [place] PMP: PDMP reviewed during this encounter.       Compliance: No problems identified. Plan: Refer to "POC".  Review of recent tests  [place] LIMITED JOINT SPACE STRUCTURES UP LEFT Date: 06/18/2018 Department: [facility]  Authorizing: Lyndal Pulley, DO  MSK US performed of: left  This study was ordered, performed, and interpreted by Charlann Boxer D.O.  Shoulder:  Supraspinatus: Appears normal on long and transverse views, Bursal bulge  seen with shoulder abduction on impingement view.  Infraspinatus:  Appears normal on long and transverse views. Significant  increase in Doppler flow  Subscapularis: Appears normal on long and transverse views. Positive bursa   Teres Minor: Appears normal on long and transverse views.  AC joint: Capsule undistended, no geyser sign.  Glenohumeral Joint: Appears normal without effusion.  Glenoid Labrum: Intact without visualized tears.  Biceps Tendon: Appears normal on long and transverse views, no fraying of  tendon, tendon located in intertubercular groove, no subluxation with  shoulder internal or external rotation.  Impression: Subacromial bursitis   Procedure: Real-time Ultrasound Guided Injection of left glenohumeral  joint  Device: GE Logiq E  Ultrasound guided injection is preferred based studies that show increased  duration, increased effect, greater accuracy, decreased procedural pain,  increased response rate with ultrasound guided versus blind injection.  Verbal informed consent obtained.  Time-out conducted.  Noted no overlying erythema, induration, or other signs of local  infection.  Skin prepped in a sterile fashion.  Local anesthesia: Topical Ethyl chloride.  With sterile technique and under real time ultrasound guidance: Joint  visualized. 23g 1  inch needle inserted posterior approach. Pictures  taken for needle placement. Patient did have injection of 2 cc of 1%  lidocaine, 2 cc of 0.5% Marcaine, and 1.0 cc of Kenalog 40 mg/dL.  Completed without difficulty  Pain immediately resolved suggesting accurate placement of the medication.   Advised to call if fevers/chills, erythema, induration, drainage, or  persistent bleeding.  Images permanently stored and available for review in the ultrasound unit.   Impression: Technically successful ultrasound guided injection.   Admission on 08/15/2018, Discharged on 08/15/2018  Component Date Value Ref Range Status  . Glucose-Capillary 08/15/2018 139* 70 - 99 mg/dL Final  . Comment 1  08/15/2018 Notify RN   Final  . Comment 2 08/15/2018 Document in Chart   Final   Assessment  The primary encounter diagnosis was Chronic pain syndrome.  Diagnoses of Lumbar facet joint syndrome (Right), Lumbar facet hypertrophy (Multilevel), Chronic low back pain (Bilateral) (L>R), Chronic musculoskeletal pain, and Fibromyalgia were also pertinent to this visit.  Plan of Care  I have changed Melinda [name]'s oxyCODONE, oxyCODONE, and oxyCODONE. I am also having her maintain her polyethylene glycol, aspirin, AMBULATORY NON FORMULARY MEDICATION, Fish Oil, VITAMIN E PO, glucosamine-chondroitin, multivitamin, Co Q 10, nitroGLYCERIN, NONFORMULARY OR COMPOUNDED ITEM, NONFORMULARY OR COMPOUNDED ITEM, NONFORMULARY OR COMPOUNDED ITEM, rosuvastatin, Turmeric, Misc Natural Products (TART CHERRY ADVANCED PO), Magnesium, Cinnamon, Accu-Chek Aviva Plus, Diclofenac Sodium, naloxone, glipiZIDE, buPROPion, Accu-Chek Softclix Lancets, PARoxetine, tiZANidine, and pregabalin.  Pharmacotherapy (Medications Ordered): Meds ordered this encounter  Medications  . tiZANidine (ZANAFLEX) 4 MG tablet    Sig: Take 1 tablet (4 mg total) by mouth 3 (three) times daily.    Dispense:  90 tablet    Refill:  2  . pregabalin (LYRICA) 100 MG capsule    Sig: Take 1 capsule (100 mg total) by mouth 3 (three) times daily.    Dispense:  270 capsule    Refill:  0    Do not place this medication, or any other prescription from our practice, on "Automatic Refill". Patient may have prescription filled one day early if pharmacy is closed on scheduled refill date.  Marland Kitchen oxyCODONE (OXY IR/ROXICODONE) 5 MG immediate release tablet    Sig: Take 1 tablet (5 mg total) by mouth every 8 (eight) hours as needed for up to 30 days for severe pain. Must last 30 days.    Dispense:  90 tablet    Refill:  0    [facility] STOP ACT - Not applicable to Chronic Pain Syndrome (G89.4) diagnosis. Fill one day early if pharmacy is closed on scheduled refill date. Do not  fill until: 12/12/18. To last until: 01/11/19.  Marland Kitchen oxyCODONE (OXY IR/ROXICODONE) 5 MG immediate release tablet    Sig: Take 1 tablet (5 mg total) by mouth every 8 (eight) hours as needed for up to 30 days for severe pain. Must last 30 days.    Dispense:  90 tablet    Refill:  0    [place] STOP ACT - Not applicable to Chronic Pain Syndrome (G89.4) diagnosis. Fill one day early if pharmacy is closed on scheduled refill date. Do not fill until: 11/12/18. To last until: 12/12/18.  Marland Kitchen oxyCODONE (OXY IR/ROXICODONE) 5 MG immediate release tablet    Sig: Take 1 tablet (5 mg total) by mouth every 8 (eight) hours as needed for up to 30 days for severe pain. Must last 30 days.    Dispense:  90 tablet    Refill:  0     STOP ACT - Not applicable to Chronic Pain Syndrome (G89.4) diagnosis. Fill one day early if pharmacy is closed on scheduled refill date. Do not fill until: 10/13/18. To last until: 11/12/18.   Orders:  Orders Placed This Encounter  Procedures  . LUMBAR FACET(MEDIAL BRANCH NERVE BLOCK) MBNB    Standing Status:   Future    Standing Expiration Date:   11/12/2018    Scheduling Instructions:     Side: Right-sided     Level: L3-4, L4-5, & L5-S1 Facets (L2, L3, L4, L5, & S1 Medial Branch Nerves)     Sedation: With Sedation.     Timeframe: ASAA    Order Specific Question:   Where will this procedure be performed?    Answer:   [facility]   Follow-up plan:   Return for Procedure (w/  sedation): (R) L-FCT BLK #1.   I discussed the assessment and treatment plan with the patient. The patient was provided an opportunity to ask questions and all were answered. The patient agreed with the plan and demonstrated an understanding of the instructions.  Patient advised to call back or seek an in-person evaluation if the symptoms or condition worsens.  Total duration of non-face-to-face encounter: 25 minutes.  Note by: Gaspar Cola, MD Date: 10/13/2018; Time: 1:25 PM  Disclaimer:  *  Given the special circumstances of the COVID-19 pandemic, the federal government has announced that the Office for Civil Rights (OCR) will exercise its enforcement discretion and will not impose penalties on physicians using telehealth in the event of noncompliance with regulatory requirements under the [place] and Accountability Act (HIPAA) in connection with the good faith provision of telehealth during the FSFSE-39 national public health emergency. ([place])

## 2018-10-13 ENCOUNTER — Other Ambulatory Visit: Payer: Self-pay

## 2018-10-13 ENCOUNTER — Ambulatory Visit: Payer: Medicare Other | Attending: Pain Medicine | Admitting: Pain Medicine

## 2018-10-13 DIAGNOSIS — M797 Fibromyalgia: Secondary | ICD-10-CM

## 2018-10-13 DIAGNOSIS — G894 Chronic pain syndrome: Secondary | ICD-10-CM | POA: Diagnosis not present

## 2018-10-13 DIAGNOSIS — G8929 Other chronic pain: Secondary | ICD-10-CM | POA: Diagnosis not present

## 2018-10-13 DIAGNOSIS — M5441 Lumbago with sciatica, right side: Secondary | ICD-10-CM

## 2018-10-13 DIAGNOSIS — M7918 Myalgia, other site: Secondary | ICD-10-CM | POA: Diagnosis not present

## 2018-10-13 DIAGNOSIS — M47816 Spondylosis without myelopathy or radiculopathy, lumbar region: Secondary | ICD-10-CM

## 2018-10-13 MED ORDER — OXYCODONE HCL 5 MG PO TABS: 5.0000 mg | ORAL_TABLET | Freq: Three times a day (TID) | ORAL | 0 refills | Status: DC | PRN

## 2018-10-13 MED ORDER — TIZANIDINE HCL 4 MG PO TABS: 4.0000 mg | ORAL_TABLET | Freq: Three times a day (TID) | ORAL | 2 refills | Status: DC

## 2018-10-13 MED ORDER — PREGABALIN 100 MG PO CAPS: 100.0000 mg | ORAL_CAPSULE | Freq: Three times a day (TID) | ORAL | 0 refills | Status: DC

## 2018-10-13 NOTE — Patient Instructions (Signed)

## 2018-10-22 ENCOUNTER — Other Ambulatory Visit: Payer: Self-pay

## 2018-10-22 MED ORDER — PAIN MANAGEMENT IT PUMP REFILL: 1.0000 | Freq: Once | INTRATHECAL | 0 refills | Status: AC

## 2018-11-04 ENCOUNTER — Encounter: Payer: Medicare Other | Admitting: Pain Medicine

## 2018-11-04 ENCOUNTER — Encounter: Payer: Medicare Other | Admitting: Nurse Practitioner

## 2018-11-04 ENCOUNTER — Encounter: Payer: Self-pay | Admitting: Pain Medicine

## 2018-11-05 ENCOUNTER — Encounter: Payer: Medicare Other | Admitting: Pain Medicine

## 2018-11-05 DIAGNOSIS — M47817 Spondylosis without myelopathy or radiculopathy, lumbosacral region: Secondary | ICD-10-CM | POA: Insufficient documentation

## 2018-11-05 NOTE — Progress Notes (Signed)
Patient's Name: Melinda [name]  MRN: [id number]  Referring Provider: Jearld Fenton, NP  DOB: 10-08-1955  PCP: Jearld Fenton, NP  DOS: 11/06/2018  Note by: Gaspar Cola, MD  Service setting: Ambulatory outpatient  Specialty: Interventional Pain Management  Patient type: Established  Location: [facility] (AMB) Pain Management Facility  Visit type: Interventional Procedure   Primary Reason for Visit: Interventional Pain Management Treatment. CC: Back Pain (mid, low, right buttock)  Procedure #1:  Intrathecal Drug Delivery System (IDDS):  Type: Reservoir Refill [id number]) No rate change Region: Abdominal Laterality: Right  Type of Pump: Medtronic Synchromed II Delivery Route: Intrathecal Type of Pain Treated: Neuropathic/Nociceptive Primary Medication Class: Opioid/opiate  Medication, Concentration, Infusion Program, & Delivery Rate: Please see scanned programming printout.   Indications: 1. Chronic pain syndrome   2. Chronic knee pain (Primary Area of Pain) (Right)   3. 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   4. Epidural fibrosis   5. Failed back surgical syndrome   6. Presence of functional implant (Medtronic programmable intrathecal pump) (Right abdominal area)  7. 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 Encounter for adjustment or management of infusion pump   9. Long term current use of opiate analgesic   10. Transplant wound hematoma   Procedure #2:  Anesthesia, Analgesia, Anxiolysis:  Type: Lumbar Facet, Medial Branch Block(s) #2  Primary Purpose: Diagnostic Region: Posterolateral Lumbosacral Spine Level: L2, L3, L4, L5, & S1 Medial Branch Level(s). Injecting these levels blocks the L3-4, L4-5, and L5-S1 lumbar facet joints. Laterality: Right  Type: Local Anesthesia Indication(s): Analgesia         Route: Infiltration ([place]/IM) IV Access: Declined Sedation: Declined  Local Anesthetic: Lidocaine  1-2%  Position: Prone   Indications: 1. Chronic low back pain (Primary area of Pain) (Right) w/o sciatica   2. Lumbar facet joint syndrome (Right)   3. Lumbar facet hypertrophy (Multilevel)   4. Spondylosis without myelopathy or radiculopathy, lumbosacral region    Pain Assessment: Self-Reported Pain Score: 6 /10             Reported level is compatible with observation.        Pain Score: Pre-procedure: 6 /10 Post-procedure: 3 /10  Lately the patient has been experiencing more pain than usual and this is the reason why we have schedule her to come in neurology for her pump refill, but also for a right-sided lumbar facet block.  In the past this had provided the patient with excellent benefit, at least for a while.  Unfortunately, the pain continues to return.  In reviewing the settings on the pump, I have noticed that her fentanyl is concentrated to only 500 mcg/mL and she is still using baclofen, despite the fact that she indicates not really having much in terms of spasms.  This could be secondary to the use of the baclofen, but it could also be that she no longer needs it.  Unfortunately, because of the pain, she has been taking not only medications through her intrathecal pump but also orally.  The goal is to switch all of her medicines to the intrathecal route and discontinue the oral route, if at all possible.  The patient was informed of their goals again she indicates being all for it.  On the patient's next refill we will be increasing the preservative-free fentanyl to 1000 mcg/mL.  We will then increase her current fentanyl infusion rate by 10 to 20%.  Intrathecal Pump Therapy Assessment  Manufacturer: Medtronic Synchromed Type: Programmable Volume: 40 mL reservoir MRI compatibility: Yes   Drug content:  Primary Medication Class:Opioid Primary Medication:PF-Fentanyl (500 mcg/mL)  Secondary Medication:PF-Bupivacaine (20 mg/mL)  Other Medication:PF-Baclofen (300 mcg/mL)      Programming:  Type: Simple continuous. See pump readout for details.   Changes:  Medication Change: None at this point Rate Change: No change in rate  Reported side-effects or adverse reactions: None reported  Effectiveness: Described as relatively effective, allowing for increase in activities of daily living (ADL) Clinically meaningful improvement in function (CMIF): Sustained CMIF goals met  Plan: Pump refill today  Pre-op Assessment:  Ms. Barno is a 63 y.o. (year old), female patient, seen today for interventional treatment. 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 Ms. Churchman has a current medication list which includes the following prescription(s): aspirin, bupropion, cinnamon, co q 10, diclofenac sodium, glipizide, glucosamine-chondroitin, magnesium, misc natural products, multivitamin, naloxone, nitroglycerin, NONFORMULARY OR COMPOUNDED ITEM, NONFORMULARY OR COMPOUNDED ITEM, NONFORMULARY OR COMPOUNDED ITEM, fish oil, oxycodone, oxycodone, oxycodone, paroxetine, polyethylene glycol, pregabalin, rosuvastatin, tizanidine, turmeric, vitamin e, accu-chek aviva plus, accu-chek softclix lancets, and AMBULATORY NON FORMULARY MEDICATION, and the following Facility-Administered Medications: lidocaine. Her primarily concern today is the Back  Pain (mid, low, right buttock)  During today's initial evaluation, it was very obvious that the patient's had fluid accumulating in the area of the intrathecal pump reservoir.  The amount of fluid accumulated in that area was sufficient to interfere with the pump readout.  Because of this, the area was prepped and local anesthetic was carefully injected over the medial portion of the abdominal scar.  An 18-gauge needle was then introduced connected to a three-way valve.  I was surprised to find that the patient had blood accumulated in the implant pocket to the tune of 425 mL.  I started using a 10 mL syringe which I left over the counter and then switch to 30 mL syringes.  After having gone through 4 of those, I basically started to feel an empty the syringe under sterile conditions into a reservoir.  The total count was that of 425 mL's of blood there was removed from the pocket.  This blood did not seem to coagulate despite having left it exposed to air.  This would suggest that this is an old hematoma from her recent surgery where she had pump anchored.  Unfortunately, the anchors must that being torn apart and there is moderate trigger the bleeding since I was able to very easily flipped the pump again.  This means that as soon as the COVID-19 restrictions are lifted, we will need to take this patient back to the OR and re-anchor the pump to avoid this pump from continuing to flip and perhaps expose the catheter to a refilling needle.  After I completely emptied the pocket, I was able to very easily palpate the pump and the port and also to remove any catheters from the top of the pump.  The pump was accessed and refilled  by me personally, to avoid the possibility of damaging the intrathecal catheter.  Initial Vital Signs:  Pulse/HCG Rate: 82ECG Heart Rate: 78 Resp: 13 BP: (!) 150/87 SpO2: 97 %  BMI: Estimated body mass index is 30.99 kg/m as calculated from the following:   Height as of this  encounter: '5\' 10"'$  (1.778 m).   Weight as of this encounter: 216 lb (98 kg).  Risk Assessment: Allergies: Reviewed. She has No Known Allergies.  Allergy Precautions: None required Coagulopathies: Reviewed. None identified.  Blood-thinner therapy: None at this time Active Infection(s): Reviewed. None identified. Ms. Gleed is afebrile  Site Confirmation: Ms. Pal was asked to confirm the procedure and laterality before marking the site Procedure checklist: Completed Consent: Before the procedure and under the influence of no sedative(s), amnesic(s), or anxiolytics, the patient was informed of the treatment options, risks and possible complications. To fulfill our ethical and legal obligations, as recommended by the American Medical Association's Code of Ethics, I have informed the patient of my clinical impression; the nature and purpose of the treatment or procedure; the risks, benefits, and possible complications of the intervention; the alternatives, including doing nothing; the risk(s) and benefit(s) of the alternative treatment(s) or procedure(s); and the risk(s) and benefit(s) of doing nothing.  Ms. Tineo was provided with information about the general risks and possible complications associated with most interventional procedures. These include, but are not limited to: failure to achieve desired goals, infection, bleeding, organ or nerve damage, allergic reactions, paralysis, and/or death.  In addition, she was informed of those risks and possible complications associated to this particular procedure, which include, but are not limited to: damage to the implant; failure to decrease pain; local, systemic, or serious CNS infections, intraspinal abscess with possible cord compression and paralysis, or life-threatening such as meningitis; bleeding; organ damage; nerve injury or damage with subsequent sensory, motor, and/or autonomic system dysfunction, resulting in transient or permanent pain,  numbness, and/or weakness of one or several areas of the body; allergic reactions, either minor or major life-threatening, such as anaphylactic or anaphylactoid reactions.  Furthermore, Ms. Boule was informed of those risks and complications associated with the medications. These include, but are not limited to: allergic reactions (i.e.: anaphylactic or anaphylactoid reactions); endorphine suppression; bradycardia and/or hypotension; water retention and/or peripheral vascular relaxation leading to lower extremity edema and possible stasis ulcers; respiratory depression and/or shortness of breath; decreased metabolic rate leading to weight gain; swelling or edema; medication-induced neural toxicity; particulate matter embolism and blood vessel occlusion with resultant organ, and/or nervous system infarction; and/or intrathecal granuloma formation with possible spinal cord compression and permanent paralysis.  Before refilling the pump Ms. Reist was informed that some of the medications used in the devise may not be FDA approved for such use and therefore it constitutes an off-label use of the medications.  Finally, she was informed that Medicine is not an exact science; therefore, there is also the possibility of unforeseen or unpredictable risks and/or possible complications that may result in a catastrophic outcome. The patient indicated having understood very clearly. We have given the patient no guarantees and we have made no promises. Enough time was given to the patient to ask questions, all of which were answered to the patient's satisfaction. Ms. Camberos has indicated that she wanted to continue with the procedure. Attestation: I, the ordering provider, attest that I have discussed with the patient the benefits, risks, side-effects, alternatives, likelihood of achieving goals, and potential problems during recovery for the procedure that  I have provided informed consent. Date   Time: 11/06/2018  11:43 AM  Pre-Procedure Preparation:  Monitoring: As per clinic protocol. Respiration, ETCO2, SpO2, BP, heart rate and rhythm monitor placed and checked for adequate function Safety Precautions: Patient was assessed for positional comfort and pressure points before starting the procedure. Time-out: I initiated and conducted the "Time-out" before starting the procedure, as per protocol. The patient was asked to participate by confirming the accuracy of the "Time Out" information. Verification of the correct person, site, and procedure were performed and confirmed by me, the nursing staff, and the patient. "Time-out" conducted as per Joint Commission's Universal Protocol (UP.01.01.01). Time: 1254  Description of Procedure #1:  Position: Supine Target Area: Central-port of intrathecal pump. Approach: Anterior, 90 degree angle approach. Area Prepped: Entire Area around the pump implant. Prepping solution: ChloraPrep (2% chlorhexidine gluconate and 70% isopropyl alcohol) Safety Precautions: Aspiration looking for blood return was conducted prior to all injections. At no point did we inject any substances, as a needle was being advanced. No attempts were made at seeking any paresthesias. Safe injection practices and needle disposal techniques used. Medications properly checked for expiration dates. SDV (single dose vial) medications used. Description of the Procedure: Protocol guidelines were followed. Two nurses trained to do implant refills were present during the entire procedure.  The procedure was personally performed by me.  The refill medication was checked by both healthcare providers as well as the patient. The patient was included in the "Time-out" to verify the medication. The patient was placed in position. The pump was identified. The area was prepped in the usual manner. The sterile template was positioned over the pump, making sure the side-port location matched that of the pump. Both, the  pump and the template were held for stability. The needle provided in the Medtronic Kit was then introduced thru the center of the template and into the central port. The pump content was aspirated and discarded volume documented. The new medication was slowly infused into the pump, thru the filter, making sure to avoid overpressure of the device. The needle was then removed and the area cleansed, making sure to leave some of the prepping solution back to take advantage of its long term bactericidal properties. The pump was interrogated and programmed to reflect the correct medication, volume, and dosage. The program was printed and taken to the physician for approval. Once checked and signed by me, a copy was provided to the patient and another scanned into the EMR.  Vitals:   11/06/18 1303 11/06/18 1308 11/06/18 1309 11/06/18 1312  BP: 94/69 100/76 118/80 124/81  Pulse:      Resp: '10 10 10 12  '$ SpO2: 97% 98% 98% 98%  Weight:      Height:        Start Time: 1230 hrs.  Materials & Medications: Medtronic Refill Kit Medication(s): Please see chart orders for details.  Imaging Guidance for procedure #1:  Type of Imaging Technique: None used Indication(s): N/A Exposure Time: No patient exposure Contrast: None used. Fluoroscopic Guidance: N/A Ultrasound Guidance: N/A Interpretation: N/A  Description of Procedure #2:  Laterality: Right Levels:  L2, L3, L4, L5, & S1 Medial Branch Level(s) Area Prepped: Posterior Lumbosacral Region Prepping solution: ChloraPrep (2% chlorhexidine gluconate and 70% isopropyl alcohol) Safety Precautions: Aspiration looking for blood return was conducted prior to all injections. At no point did we inject any substances, as a needle was being advanced. Before injecting, the patient was told to immediately notify me  if she was experiencing any new onset of "ringing in the ears, or metallic taste in the mouth". No attempts were made at seeking any paresthesias. Safe  injection practices and needle disposal techniques used. Medications properly checked for expiration dates. SDV (single dose vial) medications used. After the completion of the procedure, all disposable equipment used was discarded in the proper designated medical waste containers. Local Anesthesia: Protocol guidelines were followed. The patient was positioned over the fluoroscopy table. The area was prepped in the usual manner. The time-out was completed. The target area was identified using fluoroscopy. A 12-in long, straight, sterile hemostat was used with fluoroscopic guidance to locate the targets for each level blocked. Once located, the skin was marked with an approved surgical skin marker. Once all sites were marked, the skin (epidermis, dermis, and hypodermis), as well as deeper tissues (fat, connective tissue and muscle) were infiltrated with a small amount of a short-acting local anesthetic, loaded on a 10cc syringe with a 25G, 1.5-in  Needle. An appropriate amount of time was allowed for local anesthetics to take effect before proceeding to the next step. Local Anesthetic: Lidocaine 2.0% The unused portion of the local anesthetic was discarded in the proper designated containers. Technical explanation of process:  L2 Medial Branch Nerve Block (MBB): The target area for the L2 medial branch is at the junction of the postero-lateral aspect of the superior articular process and the superior, posterior, and medial edge of the transverse process of L3. Under fluoroscopic guidance, a Quincke needle was inserted until contact was made with os over the superior postero-lateral aspect of the pedicular shadow (target area). After negative aspiration for blood, 0.5 mL of the nerve block solution was injected without difficulty or complication. The needle was removed intact. L3 Medial Branch Nerve Block (MBB): The target area for the L3 medial branch is at the junction of the postero-lateral aspect of the  superior articular process and the superior, posterior, and medial edge of the transverse process of L4. Under fluoroscopic guidance, a Quincke needle was inserted until contact was made with os over the superior postero-lateral aspect of the pedicular shadow (target area). After negative aspiration for blood, 0.5 mL of the nerve block solution was injected without difficulty or complication. The needle was removed intact. L4 Medial Branch Nerve Block (MBB): The target area for the L4 medial branch is at the junction of the postero-lateral aspect of the superior articular process and the superior, posterior, and medial edge of the transverse process of L5. Under fluoroscopic guidance, a Quincke needle was inserted until contact was made with os over the superior postero-lateral aspect of the pedicular shadow (target area). After negative aspiration for blood, 0.5 mL of the nerve block solution was injected without difficulty or complication. The needle was removed intact. L5 Medial Branch Nerve Block (MBB): The target area for the L5 medial branch is at the junction of the postero-lateral aspect of the superior articular process and the superior, posterior, and medial edge of the sacral ala. Under fluoroscopic guidance, a Quincke needle was inserted until contact was made with os over the superior postero-lateral aspect of the pedicular shadow (target area). After negative aspiration for blood, 0.5 mL of the nerve block solution was injected without difficulty or complication. The needle was removed intact. S1 Medial Branch Nerve Block (MBB): The target area for the S1 medial branch is at the posterior and inferior 6 o'clock position of the L5-S1 facet joint. Under fluoroscopic guidance, the Quincke needle  inserted for the L5 MBB was redirected until contact was made with os over the inferior and postero aspect of the sacrum, at the 6 o' clock position under the L5-S1 facet joint (Target area). After negative  aspiration for blood, 0.5 mL of the nerve block solution was injected without difficulty or complication. The needle was removed intact.  Nerve block solution: 0.2% PF-Ropivacaine + Triamcinolone (40 mg/mL) diluted to a final concentration of 4 mg of Triamcinolone/mL of Ropivacaine The unused portion of the solution was discarded in the proper designated containers. Procedural Needles: 22-gauge, 3.5-inch, Quincke needles used for all levels.  Once the entire procedure was completed, the treated area was cleaned, making sure to leave some of the prepping solution back to take advantage of its long term bactericidal properties.   Illustration of the posterior view of the lumbar spine and the posterior neural structures. Laminae of L2 through S1 are labeled. DPRL5, dorsal primary ramus of L5; DPRS1, dorsal primary ramus of S1; DPR3, dorsal primary ramus of L3; FJ, facet (zygapophyseal) joint L3-L4; I, inferior articular process of L4; LB1, lateral branch of dorsal primary ramus of L1; IAB, inferior articular branches from L3 medial branch (supplies L4-L5 facet joint); IBP, intermediate branch plexus; MB3, medial branch of dorsal primary ramus of L3; NR3, third lumbar nerve root; S, superior articular process of L5; SAB, superior articular branches from L4 (supplies L4-5 facet joint also); TP3, transverse process of L3.  Vitals:   11/06/18 1303 11/06/18 1308 11/06/18 1309 11/06/18 1312  BP: 94/69 100/76 118/80 124/81  Pulse:      Resp: '10 10 10 12  '$ SpO2: 97% 98% 98% 98%  Weight:      Height:       End Time: 1302 hrs.  Imaging Guidance (Spinal) for procedure #2:  Type of Imaging Technique: Fluoroscopy Guidance (Spinal) Indication(s): Assistance in needle guidance and placement for procedures requiring needle placement in or near specific anatomical locations not easily accessible without such assistance. Exposure Time: Please see nurses notes. Contrast: None used. Fluoroscopic Guidance: I was  personally present during the use of fluoroscopy. "Tunnel Vision Technique" used to obtain the best possible view of the target area. Parallax error corrected before commencing the procedure. "Direction-depth-direction" technique used to introduce the needle under continuous pulsed fluoroscopy. Once target was reached, antero-posterior, oblique, and lateral fluoroscopic projection used confirm needle placement in all planes. Images permanently stored in EMR. Interpretation: No contrast injected. I personally interpreted the imaging intraoperatively. Adequate needle placement confirmed in multiple planes. Permanent images saved into the patient's record.  Antibiotic Prophylaxis:   Anti-infectives (From admission, onward)   None     Indication(s): None identified  Post-operative Assessment:  Post-procedure Vital Signs:  Pulse/HCG Rate: 8270 Resp: 12 BP: 124/81 SpO2: 98 %  EBL: None  Complications: No immediate post-treatment complications observed by team, or reported by patient.  Note: The patient tolerated the entire procedure well. A repeat set of vitals were taken after the procedure and the patient was kept under observation following institutional policy, for this type of procedure. Post-procedural neurological assessment was performed, showing return to baseline, prior to discharge. The patient was provided with post-procedure discharge instructions, including a section on how to identify potential problems. Should any problems arise concerning this procedure, the patient was given instructions to immediately contact us, at any time, without hesitation. In any case, we plan to contact the patient by telephone for a follow-up status report regarding this interventional procedure.  Comments:  No additional relevant information.  Plan of Care  Orders:  Orders Placed This Encounter  Procedures   PUMP REFILL    Whenever possible schedule on a procedure today.    Standing Status:    Future    Standing Expiration Date:   04/04/2019    Scheduling Instructions:     Please schedule intrathecal pump refill based on pump programming. Avoid schedule intervals of more than 120 days (4 months).    Order Specific Question:   Where will this procedure be performed?    Answer:   [facility]   LUMBAR FACET(MEDIAL BRANCH NERVE BLOCK) MBNB    Scheduling Instructions:     Side: Right-sided     Level: L3-4, L4-5, & L5-S1 Facets (L2, L3, L4, L5, & S1 Medial Branch Nerves)     Sedation: With Sedation.     Timeframe: Today    Order Specific Question:   Where will this procedure be performed?    Answer:   [facility]   Gram Stain/Body Fluid Culture   DG PAIN CLINIC C-ARM 1-60 MIN NO REPORT    Intraoperative interpretation by procedural physician at [place].    Standing Status:   Standing    Number of Occurrences:   1    Order Specific Question:   Reason for exam:    Answer:   Assistance in needle guidance and placement for procedures requiring needle placement in or near specific anatomical locations not easily accessible without such assistance.   Body fluid cell count with differential   Medications ordered for procedure: Meds ordered this encounter  Medications   lidocaine (XYLOCAINE) 2 % (with pres) injection 400 mg   ropivacaine (PF) 2 mg/mL (0.2%) (NAROPIN) injection 9 mL   triamcinolone acetonide (KENALOG-40) injection 40 mg   Medications administered: We administered ropivacaine (PF) 2 mg/mL (0.2%) and triamcinolone acetonide.  See the medical record for exact dosing, route, and time of administration.  Disposition: Discharge home  Discharge Date & Time: 11/06/2018; 1315 hrs.   Follow-up plan:   Return for Pump Refill (Max:98mo, in addition, (Post-proc), Evaluation, (2 wks).     Future Appointments  Date Time Provider DOlive Hill 11/19/2018  9:15 AM NMilinda Pointer MD [facility] None  12/03/2018 11:30 AM [facility]  [facility]  02/11/2019 11:45 AM NMilinda Pointer MD [facility] None  03/12/2019 12:15 PM Baity, RCoralie Keens NP [facility] P[facility]   Primary Care Physician: BJearld Fenton NP [facility] Note by: FGaspar Cola MD Date: 11/06/2018; Time: 3:31 PM  Disclaimer:  Medicine is not an eChief Strategy Officer The only guarantee in medicine is that nothing is guaranteed. It is important to note that the decision to proceed with this intervention was based on the information collected from the patient. The Data and conclusions were drawn from the patient's questionnaire, the interview, and the physical examination. Because the information was provided in large part by the patient, it cannot be guaranteed that it has not been purposely or unconsciously manipulated. Every effort has been made to obtain as much relevant data as possible for this evaluation. It is important to note that the conclusions that lead to this procedure are derived in large part from the available data. Always take into account that the treatment will also be dependent on availability of resources and existing treatment guidelines, considered by other Pain Management Practitioners as being common knowledge and practice, at the time of the intervention. For Medico-Legal purposes,  it is also important to point out that variation in procedural techniques and pharmacological choices are the acceptable norm. The indications, contraindications, technique, and results of the above procedure should only be interpreted and judged by a Board-Certified Interventional Pain Specialist with extensive familiarity and expertise in the same exact procedure and technique.

## 2018-11-05 NOTE — Patient Instructions (Addendum)

## 2018-11-06 ENCOUNTER — Ambulatory Visit
Admission: RE | Admit: 2018-11-06 | Discharge: 2018-11-06 | Disposition: A | Discharge status: Home / Self Care | Payer: Medicare Other | Source: Ambulatory Visit | Attending: Pain Medicine | Admitting: Pain Medicine

## 2018-11-06 ENCOUNTER — Encounter: Payer: Self-pay | Admitting: Pain Medicine

## 2018-11-06 ENCOUNTER — Telehealth: Payer: Self-pay | Admitting: *Deleted

## 2018-11-06 ENCOUNTER — Other Ambulatory Visit: Payer: Self-pay

## 2018-11-06 ENCOUNTER — Ambulatory Visit (HOSPITAL_BASED_OUTPATIENT_CLINIC_OR_DEPARTMENT_OTHER): Payer: Medicare Other | Admitting: Pain Medicine

## 2018-11-06 VITALS — BP 124/81 | HR 82 | Resp 12 | Ht 70.0 in | Wt 216.0 lb

## 2018-11-06 DIAGNOSIS — Z451 Encounter for adjustment and management of infusion pump: Secondary | ICD-10-CM | POA: Insufficient documentation

## 2018-11-06 DIAGNOSIS — M47816 Spondylosis without myelopathy or radiculopathy, lumbar region: Secondary | ICD-10-CM | POA: Insufficient documentation

## 2018-11-06 DIAGNOSIS — T8699 Other complications of unspecified transplanted organ and tissue: Secondary | ICD-10-CM

## 2018-11-06 DIAGNOSIS — M545 Low back pain, unspecified: Secondary | ICD-10-CM

## 2018-11-06 DIAGNOSIS — M25561 Pain in right knee: Secondary | ICD-10-CM

## 2018-11-06 DIAGNOSIS — G96198 Other disorders of meninges, not elsewhere classified: Secondary | ICD-10-CM

## 2018-11-06 DIAGNOSIS — M47817 Spondylosis without myelopathy or radiculopathy, lumbosacral region: Secondary | ICD-10-CM | POA: Diagnosis not present

## 2018-11-06 DIAGNOSIS — G894 Chronic pain syndrome: Secondary | ICD-10-CM | POA: Diagnosis not present

## 2018-11-06 DIAGNOSIS — G8929 Other chronic pain: Secondary | ICD-10-CM | POA: Insufficient documentation

## 2018-11-06 DIAGNOSIS — M961 Postlaminectomy syndrome, not elsewhere classified: Secondary | ICD-10-CM | POA: Diagnosis not present

## 2018-11-06 DIAGNOSIS — Z969 Presence of functional implant, unspecified: Secondary | ICD-10-CM | POA: Insufficient documentation

## 2018-11-06 DIAGNOSIS — Z79891 Long term (current) use of opiate analgesic: Secondary | ICD-10-CM

## 2018-11-06 DIAGNOSIS — G9619 Other disorders of meninges, not elsewhere classified: Secondary | ICD-10-CM | POA: Insufficient documentation

## 2018-11-06 DIAGNOSIS — Z978 Presence of other specified devices: Secondary | ICD-10-CM | POA: Diagnosis not present

## 2018-11-06 MED ORDER — LIDOCAINE HCL 2 % IJ SOLN
20.0000 mL | Freq: Once | INTRAMUSCULAR | Status: DC
  Filled 2018-11-06: qty 20

## 2018-11-06 MED ORDER — SODIUM CHLORIDE (PF) 0.9 % IJ SOLN
INTRAMUSCULAR | Status: AC
  Filled 2018-11-06: qty 10

## 2018-11-06 MED ORDER — LIDOCAINE HCL 2 % IJ SOLN
INTRAMUSCULAR | Status: AC
  Filled 2018-11-06: qty 20

## 2018-11-06 MED ORDER — EPHEDRINE SULFATE 50 MG/ML IJ SOLN
INTRAMUSCULAR | Status: AC
  Filled 2018-11-06: qty 1

## 2018-11-06 MED ORDER — LIDOCAINE HCL (PF) 1 % IJ SOLN
INTRAMUSCULAR | Status: AC
  Filled 2018-11-06: qty 5

## 2018-11-06 MED ORDER — ROPIVACAINE HCL 2 MG/ML IJ SOLN
9.0000 mL | Freq: Once | INTRAMUSCULAR | Status: AC
  Administered 2018-11-06: 10 mL via PERINEURAL
  Filled 2018-11-06: qty 10

## 2018-11-06 MED ORDER — TRIAMCINOLONE ACETONIDE 40 MG/ML IJ SUSP
40.0000 mg | Freq: Once | INTRAMUSCULAR | Status: AC
  Administered 2018-11-06: 40 mg
  Filled 2018-11-06: qty 1

## 2018-11-06 NOTE — Progress Notes (Signed)
Safety precautions to be maintained throughout the outpatient stay will include: orient to surroundings, keep bed in low position, maintain call bell within reach at all times, provide assistance with transfer out of bed and ambulation.  

## 2018-11-06 NOTE — Telephone Encounter (Signed)
Question answered. 

## 2018-11-10 DIAGNOSIS — L97511 Non-pressure chronic ulcer of other part of right foot limited to breakdown of skin: Secondary | ICD-10-CM | POA: Diagnosis not present

## 2018-11-10 DIAGNOSIS — E1142 Type 2 diabetes mellitus with diabetic polyneuropathy: Secondary | ICD-10-CM | POA: Diagnosis not present

## 2018-11-10 LAB — GRAM STAIN/BODY FLUID CULTURE: Organism ID, Bacteria: NONE SEEN

## 2018-11-13 ENCOUNTER — Encounter: Payer: Self-pay | Admitting: Pain Medicine

## 2018-11-13 NOTE — Telephone Encounter (Signed)
Spoke with Melinda [name] and let her know that I spoke with [name] and he would like her to f/up with [name] since this is an ongoing problem.  Melinda [name] is a little put out with that office and feels that she does not get the attention when she calls.  I told her that I would be glad to call to their office and let them know  The situation and that [name] would like them to f/up with her.   Called to [name] office and left message with Nurse Practitioner as to exactly what was going on reported from [place] and asked that they reach out to her and schedule an evaluation for this.

## 2018-11-17 ENCOUNTER — Encounter: Payer: Self-pay | Admitting: Pain Medicine

## 2018-11-17 DIAGNOSIS — E1142 Type 2 diabetes mellitus with diabetic polyneuropathy: Secondary | ICD-10-CM | POA: Diagnosis not present

## 2018-11-17 DIAGNOSIS — L97511 Non-pressure chronic ulcer of other part of right foot limited to breakdown of skin: Secondary | ICD-10-CM | POA: Diagnosis not present

## 2018-11-17 NOTE — Progress Notes (Signed)
Pain Management Virtual Encounter Note - Virtual Visit via Telephone Telehealth (real-time audio visits between healthcare provider and patient).  Patient's Phone No. & Preferred Pharmacy:  [phone] (home); [phone] (mobile); (Preferred) [phone] [email or web address]  [id number] [place] [place] [facility] [place] [address] [place] [address] Phone: [phone] Fax: [phone]  [id number] [address], [place] - [address] [place] [address] Phone: [phone] Fax: [phone]   Pre-screening note:  Our staff contacted Melinda [name] and offered her an "in person", "face-to-face" appointment versus a telephone encounter. She indicated preferring the telephone encounter, at this time.  Reason for Virtual Visit: COVID-19*  Social distancing based on CDC and AMA recommendations.   I contacted Melinda [name] on 11/18/2018 at 10:10 AM via telephone.      I clearly identified myself as Gaspar Cola, MD. I verified that I was speaking with the correct person using two identifiers (Name and date of birth: 1956/01/30).  Advanced Informed Consent I sought verbal advanced consent from Melinda [name] for virtual visit interactions. I informed Melinda [name] of possible security and privacy concerns, risks, and limitations associated with providing "not-in-person" medical evaluation and management services. I also informed Melinda [name] of the availability of "in-person" appointments. Finally, I informed her that there would be a charge for the virtual visit and that she could be  personally, fully or partially, financially responsible for it. Melinda [name] expressed understanding and agreed to proceed.   Historic Elements   Melinda [name] is a 63 y.o. year old, female patient evaluated today after her last encounter by our practice on 11/06/2018. Melinda [name]  has a past medical history of Amputation of great toe, right, traumatic ([place])  (05/30/2010), Amputation of second toe, right, traumatic ([place]) (09/2017), Anemia, Anxiety, Blue toes, Bulging disc, CAD (coronary artery disease) (2009), Cardiac arrhythmia due to congenital heart disease, Chicken pox, Chronic fatigue, Chronic kidney disease, Coronary arteritis, Degenerative disc disease, Depression, Diabetes mellitus without complication ([place]), Facet joint disease, Fibromyalgia, Heart disease, Hyperlipidemia, IBS (irritable bowel syndrome), MCL deficiency, knee, MRSA (methicillin resistant staph aureus) culture positive (2011), Neuropathy (04/18/2010), Peripheral neuropathy, Restless leg syndrome, Sepsis ([place]) (09/2013), Sleep apnea (08/17/2003), and Spinal stenosis. She also  has a past surgical history that includes Foot surgery (Right); Hand surgery (Left); Gallbladder surgery; Ablation; Ablation; HEART STENT (2009); HAND SURGEY (Left); Foot surgery (Bilateral); Foot surgery (Right); Infusion pump implantation; Back surgery; Cholecystectomy (2003); Anterior cervical decomp/discectomy fusion (N/A, 07/28/2014); Carpal tunnel release (Right); Eye surgery (Bilateral, 2013); Amputation toe (Right, 02/01/2017); Irrigation and debridement foot (Right, 02/23/2017); Toe Surgery; Hammer toe surgery (Right, 10/17/2017); Intrathecal pump revision (N/A, 04/25/2018); Intrathecal pump revision (Right, 04/25/2018); Coronary angioplasty (2008); and Pain pump revision (N/A, 08/15/2018). Melinda [name] has a current medication list which includes the following prescription(s): accu-chek aviva plus, accu-chek softclix lancets, AMBULATORY NON FORMULARY MEDICATION, aspirin, bupropion, cinnamon, co q 10, diclofenac sodium, glipizide, glucosamine-chondroitin, magnesium, misc natural products, multivitamin, naloxone, nitroglycerin, NONFORMULARY OR COMPOUNDED ITEM, NONFORMULARY OR COMPOUNDED ITEM, NONFORMULARY OR COMPOUNDED ITEM, fish oil, oxycodone, oxycodone, paroxetine, polyethylene glycol, pregabalin, rosuvastatin,  tizanidine, turmeric, vitamin e, and oxycodone. She  reports that she quit smoking about 11 years ago. Her smoking use included cigarettes. She has a 48.00 pack-year smoking history. She has never used smokeless tobacco. She reports current alcohol use. She reports that she does not use drugs. Melinda [name] has No Known Allergies.   HPI  I last communicated with her on 11/06/2018. Today, she is being  contacted for a post-procedure assessment.  The patient reports that she Dr. Maryjean Ka this morning and they have decided to take her to the OR to explore the intrathecal pump and the bleed.  She reports that he has again began to accumulate after the last time that we tapped it and removed about 450 mL's of blood.  The Gram stain came back negative with no evidence of white blood cells or organisms.  Post-Procedure Evaluation  Procedure: Diagnostic right-sided lumbar facet block #2 under fluoroscopic guidance, no sedation Pre-procedure pain level:  6/10 Post-procedure: 3/10 Limited initial benefit, possibly due to rapid discharge after no sedation procedure, without enough time to allow full onset of block.  Initial Analgesic Effects (1st post-procedure hour): 100 % Sedation: None used. When none is used, analgesia during this period is strictly due to the local anesthetic. When sedation is administered, analgesia may be a combination of the IV analgesic/anxiolytic, plus the effect of the local anesthetics used.  Persistent Analgesic Response (subsequent 4-6 hours post-procedure): 100 % Local anesthetic used: Long-acting (4-6 hours) Analgesic effects during this period is associated to the localized infiltration of local anesthetics and therefore caries significant diagnostic value as to the etiological location, or anatomical origin, of the pain. Expected duration of relief is directly dependent on the pharmacodynamics of the local anesthetic used.  Analgesic Response past initial 6 hours post-procedure: 100  % Long-term benefit: Defined as any relief past the pharmacologic duration of the local anesthetics.   Current benefits: Defined as benefit that persist at this time.   Analgesia: 100% ongoing relief. Function: Melinda [name] reports improvement in function ROM: Melinda [name] reports improvement in ROM  Review of recent tests  [place] LIMITED JOINT SPACE STRUCTURES UP LEFT MSK US performed of: left  This study was ordered, performed, and interpreted by Charlann Boxer D.O.  Shoulder:  Supraspinatus: Appears normal on long and transverse views, Bursal bulge  seen with shoulder abduction on impingement view.  Infraspinatus: Appears normal on long and transverse views. Significant  increase in Doppler flow  Subscapularis: Appears normal on long and transverse views. Positive bursa   Teres Minor: Appears normal on long and transverse views.  AC joint: Capsule undistended, no geyser sign.  Glenohumeral Joint: Appears normal without effusion.  Glenoid Labrum: Intact without visualized tears.  Biceps Tendon: Appears normal on long and transverse views, no fraying of  tendon, tendon located in intertubercular groove, no subluxation with  shoulder internal or external rotation.  Impression: Subacromial bursitis   Procedure: Real-time Ultrasound Guided Injection of left glenohumeral  joint  Device: GE Logiq E  Ultrasound guided injection is preferred based studies that show increased  duration, increased effect, greater accuracy, decreased procedural pain,  increased response rate with ultrasound guided versus blind injection.  Verbal informed consent obtained.  Time-out conducted.  Noted no overlying erythema, induration, or other signs of local  infection.  Skin prepped in a sterile fashion.  Local anesthesia: Topical Ethyl chloride.  With sterile technique and under real time ultrasound guidance: Joint  visualized. 23g 1  inch needle inserted posterior approach. Pictures  taken for needle  placement. Patient did have injection of 2 cc of 1%  lidocaine, 2 cc of 0.5% Marcaine, and 1.0 cc of Kenalog 40 mg/dL.  Completed without difficulty  Pain immediately resolved suggesting accurate placement of the medication.   Advised to call if fevers/chills, erythema, induration, drainage, or  persistent bleeding.  Images permanently stored and available for review in the ultrasound unit.  Impression: Technically successful ultrasound guided injection.   Procedure visit on 11/06/2018  Component Date Value Ref Range Status  . Gram Stain Result 11/06/2018 Final report   Final  . Organism ID, Bacteria 11/06/2018 Comment   Final   No white blood cells seen.  . Organism ID, Bacteria 11/06/2018 No organisms seen   Final  . Body Fluid Culture, Sterile 11/06/2018 Final report   Final  . Organism ID, Bacteria 11/06/2018 Comment   Final   No growth in 56 - 72 hours.   Assessment  The primary encounter diagnosis was Chronic pain syndrome. Diagnoses of Chronic low back pain (Bilateral) (L>R), Chronic knee pain (Primary Area of Pain) (Right), Failed back surgical syndrome, Lumbar facet joint syndrome (Right), and Transplant wound hematoma were also pertinent to this visit.  Plan of Care  I am having Melinda [name] maintain her polyethylene glycol, aspirin, AMBULATORY NON FORMULARY MEDICATION, Fish Oil, VITAMIN E PO, glucosamine-chondroitin, multivitamin, Co Q 10, nitroGLYCERIN, NONFORMULARY OR COMPOUNDED ITEM, NONFORMULARY OR COMPOUNDED ITEM, NONFORMULARY OR COMPOUNDED ITEM, rosuvastatin, Turmeric, Misc Natural Products (TART CHERRY ADVANCED PO), Magnesium, Cinnamon, Accu-Chek Aviva Plus, Diclofenac Sodium, naloxone, glipiZIDE, buPROPion, Accu-Chek Softclix Lancets, PARoxetine, tiZANidine, pregabalin, oxyCODONE, oxyCODONE, and oxyCODONE.  Pharmacotherapy (Medications Ordered): No orders of the defined types were placed in this encounter.  Orders:  No orders of the defined types were placed in  this encounter.  Follow-up plan:   Return for Pump Refill (Max:30mo).   I discussed the assessment and treatment plan with the patient. The patient was provided an opportunity to ask questions and all were answered. The patient agreed with the plan and demonstrated an understanding of the instructions.  Patient advised to call back or seek an in-person evaluation if the symptoms or condition worsens.  Total duration of non-face-to-face encounter: 20 minutes.  Note by: Gaspar Cola, MD Date: 11/18/2018; Time: 10:52 AM  Disclaimer:  * Given the special circumstances of the COVID-19 pandemic, the federal government has announced that the Office for Civil Rights (OCR) will exercise its enforcement discretion and will not impose penalties on physicians using telehealth in the event of noncompliance with regulatory requirements under the [place] and Accountability Act (HIPAA) in connection with the good faith provision of telehealth during the DGLOV-56 national public health emergency. ([place])

## 2018-11-18 ENCOUNTER — Ambulatory Visit: Payer: Medicare Other | Attending: Pain Medicine | Admitting: Pain Medicine

## 2018-11-18 ENCOUNTER — Other Ambulatory Visit: Payer: Self-pay

## 2018-11-18 ENCOUNTER — Other Ambulatory Visit: Payer: Self-pay | Admitting: Anesthesiology

## 2018-11-18 DIAGNOSIS — D649 Anemia, unspecified: Secondary | ICD-10-CM | POA: Diagnosis not present

## 2018-11-18 DIAGNOSIS — G894 Chronic pain syndrome: Secondary | ICD-10-CM

## 2018-11-18 DIAGNOSIS — G8929 Other chronic pain: Secondary | ICD-10-CM | POA: Diagnosis not present

## 2018-11-18 DIAGNOSIS — M47816 Spondylosis without myelopathy or radiculopathy, lumbar region: Secondary | ICD-10-CM

## 2018-11-18 DIAGNOSIS — Z9689 Presence of other specified functional implants: Secondary | ICD-10-CM | POA: Diagnosis not present

## 2018-11-18 DIAGNOSIS — M25561 Pain in right knee: Secondary | ICD-10-CM | POA: Diagnosis not present

## 2018-11-18 DIAGNOSIS — T8699 Other complications of unspecified transplanted organ and tissue: Secondary | ICD-10-CM

## 2018-11-18 DIAGNOSIS — M5441 Lumbago with sciatica, right side: Secondary | ICD-10-CM | POA: Diagnosis not present

## 2018-11-18 DIAGNOSIS — M961 Postlaminectomy syndrome, not elsewhere classified: Secondary | ICD-10-CM

## 2018-11-18 DIAGNOSIS — D689 Coagulation defect, unspecified: Secondary | ICD-10-CM | POA: Diagnosis not present

## 2018-11-19 ENCOUNTER — Ambulatory Visit: Payer: Medicare Other | Admitting: Pain Medicine

## 2018-11-23 ENCOUNTER — Other Ambulatory Visit: Payer: Self-pay | Admitting: Internal Medicine

## 2018-11-24 MED FILL — Medication: INTRATHECAL | Qty: 1 | Status: AC

## 2018-12-01 ENCOUNTER — Other Ambulatory Visit: Payer: Self-pay | Admitting: Internal Medicine

## 2018-12-01 DIAGNOSIS — L97511 Non-pressure chronic ulcer of other part of right foot limited to breakdown of skin: Secondary | ICD-10-CM | POA: Diagnosis not present

## 2018-12-03 ENCOUNTER — Other Ambulatory Visit: Payer: Medicare Other

## 2018-12-09 ENCOUNTER — Other Ambulatory Visit: Payer: Self-pay

## 2018-12-09 ENCOUNTER — Other Ambulatory Visit (HOSPITAL_COMMUNITY)
Admission: RE | Admit: 2018-12-09 | Discharge: 2018-12-09 | Disposition: A | Discharge status: Home / Self Care | Payer: Medicare Other | Source: Ambulatory Visit | Attending: Obstetrics & Gynecology | Admitting: Obstetrics & Gynecology

## 2018-12-09 DIAGNOSIS — Z01812 Encounter for preprocedural laboratory examination: Secondary | ICD-10-CM | POA: Insufficient documentation

## 2018-12-09 DIAGNOSIS — Z1159 Encounter for screening for other viral diseases: Secondary | ICD-10-CM | POA: Diagnosis not present

## 2018-12-09 NOTE — Progress Notes (Signed)
[facility] [place] [place] [address] [place] [address] Phone: [phone] Fax: [phone]  [id number] [place] [place] - [address] [address] [place] [address] Phone: [phone] Fax: [phone]      Your procedure is scheduled on Friday December 12, 2018.  Report to [facility] Main Entrance "A" at 0530 A.M., and check in at the Admitting office.  Call this number if you have problems the morning of surgery:  [phone]  Call [phone] if you have any questions prior to your surgery date Monday-Friday 8am-4pm    Remember:  Do not eat or drink after midnight.   Take these medicines the morning of surgery with A SIP OF WATER: Bupropion (Wellbutrin XL) Oxycodone (Oxy IR) - if needed for pain Paroxetine (Paxil) Pregabalin (Lyrica) Rosuvastatin (Crestor) Tizanadine (Zanaflex)  7 days prior to surgery STOP taking any Aspirin (unless otherwise instructed by your surgeon), Aleve, Naproxen, Ibuprofen, Motrin, Advil, Goody's, BC's, all herbal medications, fish oil, and all vitamins.  Follow your surgeon's instructions on when to stop Aspirin.  If no instructions were given by your surgeon then you will need to call the office to get those instructions.     WHAT DO I DO ABOUT MY DIABETES MEDICATION?   Marland Kitchen Do not take oral diabetes medicines (pills) the morning of surgery.  . THE MORNING OF SURGERY, DO NOT take your Glipizide (Glucotrol XL)   How to Manage Your Diabetes Before and After Surgery  Why is it important to control my blood sugar before and after surgery? . Improving blood sugar levels before and after surgery helps healing and can limit problems. . A way of improving blood sugar control is eating a healthy diet by: o  Eating less sugar and carbohydrates o  Increasing activity/exercise o  Talking with your doctor about reaching your blood sugar goals . High blood sugars (greater than 180  mg/dL) can raise your risk of infections and slow your recovery, so you will need to focus on controlling your diabetes during the weeks before surgery. . Make sure that the doctor who takes care of your diabetes knows about your planned surgery including the date and location.  How do I manage my blood sugar before surgery? . Check your blood sugar at least 4 times a day, starting 2 days before surgery, to make sure that the level is not too high or low. o Check your blood sugar the morning of your surgery when you wake up and every 2 hours until you get to the Short Stay unit. . If your blood sugar is less than 70 mg/dL, you will need to treat for low blood sugar: o Do not take insulin. o Treat a low blood sugar (less than 70 mg/dL) with  cup of clear juice (cranberry or apple), 4 glucose tablets, OR glucose gel. o Recheck blood sugar in 15 minutes after treatment (to make sure it is greater than 70 mg/dL). If your blood sugar is not greater than 70 mg/dL on recheck, call [phone] for further instructions. . Report your blood sugar to the short stay nurse when you get to Short Stay.  . If you are admitted to the hospital after surgery: o Your blood sugar will be checked by the staff and you will probably be given insulin after surgery (instead of oral diabetes medicines) to make sure you have good blood sugar levels. o The goal for blood sugar control after surgery is  80-180 mg/dL.    The Morning of Surgery  Do not wear jewelry, make-up or nail polish.  Do not wear lotions, powders, or perfumes/colognes, or deodorant  Do not shave 48 hours prior to surgery.  Men may shave face and neck.  Do not bring valuables to the hospital.  [facility] is not responsible for any belongings or valuables.  If you are a smoker, DO NOT Smoke 24 hours prior to surgery IF you wear a CPAP at night please bring your mask, tubing, and machine the morning of surgery   Remember that you must have someone  to transport you home after your surgery, and remain with you for 24 hours if you are discharged the same day.   Contacts, glasses, hearing aids, dentures or bridgework may not be worn into surgery.    Leave your suitcase in the car.  After surgery it may be brought to your room.  For patients admitted to the hospital, discharge time will be determined by your treatment team.  Patients discharged the day of surgery will not be allowed to drive home.    Special instructions:   [facility]- Preparing For Surgery  Before surgery, you can play an important role. Because skin is not sterile, your skin needs to be as free of germs as possible. You can reduce the number of germs on your skin by washing with CHG (chlorahexidine gluconate) Soap before surgery.  CHG is an antiseptic cleaner which kills germs and bonds with the skin to continue killing germs even after washing.    Oral Hygiene is also important to reduce your risk of infection.  Remember - BRUSH YOUR TEETH THE MORNING OF SURGERY WITH YOUR REGULAR TOOTHPASTE  Please do not use if you have an allergy to CHG or antibacterial soaps. If your skin becomes reddened/irritated stop using the CHG.  Do not shave (including legs and underarms) for at least 48 hours prior to first CHG shower. It is OK to shave your face.  Please follow these instructions carefully.   1. Shower the NIGHT BEFORE SURGERY and the MORNING OF SURGERY with CHG Soap.   2. If you chose to wash your hair, wash your hair first as usual with your normal shampoo.  3. After you shampoo, rinse your hair and body thoroughly to remove the shampoo.  4. Use CHG as you would any other liquid soap. You can apply CHG directly to the skin and wash gently with a scrungie or a clean washcloth.   5. Apply the CHG Soap to your body ONLY FROM THE NECK DOWN.  Do not use on open wounds or open sores. Avoid contact with your eyes, ears, mouth and genitals (private parts). Wash Face and  genitals (private parts)  with your normal soap.   6. Wash thoroughly, paying special attention to the area where your surgery will be performed.  7. Thoroughly rinse your body with warm water from the neck down.  8. DO NOT shower/wash with your normal soap after using and rinsing off the CHG Soap.  9. Pat yourself dry with a CLEAN TOWEL.  10. Wear CLEAN PAJAMAS to bed the night before surgery, wear comfortable clothes the morning of surgery  11. Place CLEAN SHEETS on your bed the night of your first shower and DO NOT SLEEP WITH PETS.    Day of Surgery: Shower as stated above. Do not apply any deodorants/lotions.  Please wear clean clothes to the hospital/surgery center.   Remember to brush  your teeth WITH YOUR REGULAR TOOTHPASTE.   Please read over the following fact sheets that you were given.

## 2018-12-10 ENCOUNTER — Encounter (HOSPITAL_COMMUNITY): Payer: Self-pay

## 2018-12-10 ENCOUNTER — Other Ambulatory Visit (HOSPITAL_COMMUNITY): Payer: Medicare Other

## 2018-12-10 ENCOUNTER — Encounter (HOSPITAL_COMMUNITY)
Admission: RE | Admit: 2018-12-10 | Discharge: 2018-12-10 | Disposition: A | Discharge status: Home / Self Care | Payer: Medicare Other | Source: Ambulatory Visit | Attending: Obstetrics and Gynecology | Admitting: Obstetrics and Gynecology

## 2018-12-10 ENCOUNTER — Other Ambulatory Visit: Payer: Self-pay

## 2018-12-10 DIAGNOSIS — E1140 Type 2 diabetes mellitus with diabetic neuropathy, unspecified: Secondary | ICD-10-CM | POA: Diagnosis not present

## 2018-12-10 DIAGNOSIS — Z7982 Long term (current) use of aspirin: Secondary | ICD-10-CM | POA: Diagnosis not present

## 2018-12-10 DIAGNOSIS — Z7984 Long term (current) use of oral hypoglycemic drugs: Secondary | ICD-10-CM | POA: Diagnosis not present

## 2018-12-10 DIAGNOSIS — I129 Hypertensive chronic kidney disease with stage 1 through stage 4 chronic kidney disease, or unspecified chronic kidney disease: Secondary | ICD-10-CM | POA: Diagnosis not present

## 2018-12-10 DIAGNOSIS — Z89421 Acquired absence of other right toe(s): Secondary | ICD-10-CM | POA: Diagnosis not present

## 2018-12-10 DIAGNOSIS — Z981 Arthrodesis status: Secondary | ICD-10-CM | POA: Diagnosis not present

## 2018-12-10 DIAGNOSIS — Z87891 Personal history of nicotine dependence: Secondary | ICD-10-CM | POA: Diagnosis not present

## 2018-12-10 DIAGNOSIS — Z01812 Encounter for preprocedural laboratory examination: Secondary | ICD-10-CM | POA: Insufficient documentation

## 2018-12-10 DIAGNOSIS — Z955 Presence of coronary angioplasty implant and graft: Secondary | ICD-10-CM | POA: Diagnosis not present

## 2018-12-10 DIAGNOSIS — Y831 Surgical operation with implant of artificial internal device as the cause of abnormal reaction of the patient, or of later complication, without mention of misadventure at the time of the procedure: Secondary | ICD-10-CM | POA: Diagnosis not present

## 2018-12-10 DIAGNOSIS — Z8614 Personal history of Methicillin resistant Staphylococcus aureus infection: Secondary | ICD-10-CM | POA: Diagnosis not present

## 2018-12-10 DIAGNOSIS — E1122 Type 2 diabetes mellitus with diabetic chronic kidney disease: Secondary | ICD-10-CM | POA: Diagnosis not present

## 2018-12-10 DIAGNOSIS — N189 Chronic kidney disease, unspecified: Secondary | ICD-10-CM | POA: Diagnosis not present

## 2018-12-10 DIAGNOSIS — G4730 Sleep apnea, unspecified: Secondary | ICD-10-CM | POA: Diagnosis not present

## 2018-12-10 DIAGNOSIS — G8929 Other chronic pain: Secondary | ICD-10-CM | POA: Diagnosis not present

## 2018-12-10 DIAGNOSIS — Z79899 Other long term (current) drug therapy: Secondary | ICD-10-CM | POA: Diagnosis not present

## 2018-12-10 DIAGNOSIS — T85890A Other specified complication of nervous system prosthetic devices, implants and grafts, initial encounter: Secondary | ICD-10-CM | POA: Diagnosis not present

## 2018-12-10 DIAGNOSIS — I2510 Atherosclerotic heart disease of native coronary artery without angina pectoris: Secondary | ICD-10-CM | POA: Diagnosis not present

## 2018-12-10 DIAGNOSIS — F329 Major depressive disorder, single episode, unspecified: Secondary | ICD-10-CM | POA: Diagnosis not present

## 2018-12-10 LAB — CBC
HCT: 42.4 % (ref 36.0–46.0)
Hemoglobin: 13.8 g/dL (ref 12.0–15.0)
MCH: 28.5 pg (ref 26.0–34.0)
MCHC: 32.5 g/dL (ref 30.0–36.0)
MCV: 87.4 fL (ref 80.0–100.0)
Platelets: 204 10*3/uL (ref 150–400)
RBC: 4.85 MIL/uL (ref 3.87–5.11)
RDW: 12.5 % (ref 11.5–15.5)
WBC: 6.3 10*3/uL (ref 4.0–10.5)
nRBC: 0 % (ref 0.0–0.2)

## 2018-12-10 LAB — GLUCOSE, CAPILLARY
Glucose-Capillary: 51 mg/dL — ABNORMAL LOW (ref 70–99)
Glucose-Capillary: 78 mg/dL (ref 70–99)

## 2018-12-10 LAB — TYPE AND SCREEN
ABO/RH(D): O POS
Antibody Screen: NEGATIVE

## 2018-12-10 LAB — NOVEL CORONAVIRUS, NAA (HOSP ORDER, SEND-OUT TO REF LAB; TAT 18-24 HRS): SARS-CoV-2, NAA: NOT DETECTED

## 2018-12-10 LAB — BASIC METABOLIC PANEL
Anion gap: 9 (ref 5–15)
BUN: 25 mg/dL — ABNORMAL HIGH (ref 8–23)
CO2: 29 mmol/L (ref 22–32)
Calcium: 9.6 mg/dL (ref 8.9–10.3)
Chloride: 105 mmol/L (ref 98–111)
Creatinine, Ser: 1.04 mg/dL — ABNORMAL HIGH (ref 0.44–1.00)
GFR calc Af Amer: >60 mL/min (ref >60)
GFR calc non Af Amer: 58 mL/min — ABNORMAL LOW (ref >60)
Glucose, Bld: 59 mg/dL — ABNORMAL LOW (ref 70–99)
Potassium: 3.9 mmol/L (ref 3.5–5.1)
Sodium: 143 mmol/L (ref 135–145)

## 2018-12-10 LAB — SURGICAL PCR SCREEN
MRSA, PCR: NEGATIVE
Staphylococcus aureus: POSITIVE — AB

## 2018-12-10 LAB — HEMOGLOBIN A1C
Hgb A1c MFr Bld: 6 % — ABNORMAL HIGH (ref 4.8–5.6)
Mean Plasma Glucose: 125.5 mg/dL

## 2018-12-10 NOTE — Progress Notes (Signed)
PCP - [name], PA Cardiologist - Dr. Marlou Porch Pain - Dr. Luanne Bras - Dr. Elvina Mattes  Chest x-ray - n/a EKG - 04/17/2018 Stress Test -  ECHO - 2017 Cardiac Cath - 2008-2009?  Sleep Study - 2005 CPAP - yes  Fasting Blood Sugar - 120's Checks Blood Sugar 1 times a day Patient's CBG at PAT appointment was 51.  Got patient coke and PB with graham crackers.  Patient's CBg rechecked 15 minutes after eating was 94.  James, Pe Ell notified.  Blood Thinner Instructions: Aspirin Instructions: LD 12/04/18  Anesthesia review: yes, cardiac hx  Patient denies shortness of breath, fever, cough and chest pain at PAT appointment   Patient verbalized understanding of instructions that were given to them at the PAT appointment. Patient was also instructed that they will need to review over the PAT instructions again at home before surgery.

## 2018-12-11 NOTE — H&P (Signed)
Melinda [name] is an 63 y.o. female.   Chief Complaint: "Fluid around my pump" HPI: Patient returned  my clinic last having been seen about 3 months prior.  We had revised her pump approximately 8 months ago, but with some weight loss her pump become dislodged and was rotating in the pocket.  We saw her in close follow-up after her revision, because she had some fluid accumulation in the pocket.  It seemed small at the time, and not concerning as seroma/fluid accumulation in the immediate postoperative period is not uncommon with revision surgeries, the site itself did not look infected, nor had the patient had any signs or symptoms of infection.  Apparently over the last 2 months, fluid has continued to accumulate in when she returned to see her primary team for pump refill about a week ago, they aspirated some 400 cc of bloody material, that had not clotted, nor clotted in the syringes with which they aspirated the fluid.  Apparently the pump had managed to become dislodged and flipped again.  The attending provider was able to flip the pump, and refill without any problems.  He referred her back for further evaluation, and likely revision of her pump.  Today, the patient again notes that there are no signs or symptoms of infection.  She has had no fevers.  She also notes that she has had no problems with easy bruising, bleeding, or clots in her past.  She has not had any signs or symptoms of anemia such as fatigue, shortness of breath, etc.  There is some reaccumulation of fluid around the pump itself.  We obtained CBC, inflammatory markers,  Coagulation parameters, all of which returned normal   Past Medical History:  Diagnosis Date  . 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 Amputation of second toe, right, traumatic ([place]) 09/2017  . Anemia    years ago  . Anxiety   . Blue toes    2nd toe on right foot, will get appt.  . Bulging disc   . CAD (coronary artery disease) 2009    s/p stent to LAD  . 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  Ablations done.  Now has rare episodes  . Chicken pox   . Chronic fatigue   . Chronic kidney disease    "problem with kidney filtration"  . Coronary arteritis   . Degenerative disc disease   . Depression   . Diabetes mellitus without complication ([place])   . Facet joint disease   . Fibromyalgia   . Heart disease   . Hyperlipidemia   . IBS (irritable bowel syndrome)   . MCL deficiency, knee   . MRSA (methicillin resistant staph aureus) culture positive 2011   GREAT TOE RIGHT FOOT  . Neuropathy 04/18/2010  . Peripheral neuropathy   . Restless leg syndrome   . Sepsis ([place]) 09/2013  . Sleep apnea 08/17/2003   uses CPAP, sleep study at [facility] (mild to moderate)  . Spinal stenosis     Past Surgical History:  Procedure Laterality Date  . ABLATION     UTERUS  . ABLATION     HEART  . AMPUTATION TOE Right 02/01/2017   Procedure: AMPUTATION TOE-RIGHT 2ND MPJ;  Surgeon: Albertine Patricia, DPM;  Location: [facility];  Service: Podiatry;  Laterality: Right;  . ANTERIOR CERVICAL DECOMP/DISCECTOMY FUSION N/A 07/28/2014   Procedure: ANTERIOR CERVICAL DECOMPRESSION/DISCECTOMY FUSION CERVICAL 3-4,4-5,5-6 LEVELS WITH INSTRUMENTATION AND ALLOGRAFT;  Surgeon: Lawrence Santiago  Dumonski, MD;  Location: [place];  Service: Orthopedics;  Laterality: N/A;  Anterior cervical decompression fusion, cervical 3-4, cervical 4-5, cervical 5-6 with instrumentation and allograft  . BACK SURGERY     X 3 1979, 1994, 1995  . CARPAL TUNNEL RELEASE Right   . CHOLECYSTECTOMY  2003  . CORONARY ANGIOPLASTY  2008  . EYE SURGERY Bilateral 2013   Eyelid lift   . FOOT SURGERY Right    BIG TOE  . FOOT SURGERY Bilateral    PLANTAR FASCIITIS  . FOOT SURGERY Right    2ND TOE  . GALLBLADDER SURGERY    . HAMMER TOE SURGERY Right 10/17/2017   Procedure: HAMMER TOE CORRECTION-4TH TOE;  Surgeon: Albertine Patricia, DPM;  Location: [place];  Service: Podiatry;  Laterality: Right;  LMA- WITH LOCAL Diabetic - diet controlled  . HAND SURGERY Left   . HAND SURGEY Left   . HEART STENT  2009   LAD  . INFUSION PUMP IMPLANTATION     X2 with morphine and baclofen  . INTRATHECAL PUMP REVISION N/A 04/25/2018   Procedure: Intrathecal pump replacement;  Surgeon: Clydell Hakim, MD;  Location: [place];  Service: Neurosurgery;  Laterality: N/A;  right  . INTRATHECAL PUMP REVISION Right 04/25/2018  . IRRIGATION AND DEBRIDEMENT FOOT Right 02/23/2017   Procedure: IRRIGATION AND DEBRIDEMENT FOOT-right foot;  Surgeon: Samara Deist, DPM;  Location: [facility];  Service: Podiatry;  Laterality: Right;  . PAIN PUMP REVISION N/A 08/15/2018   Procedure: Intrathecal PUMP REVISION;  Surgeon: Clydell Hakim, MD;  Location: [place];  Service: Neurosurgery;  Laterality: N/A;  INTRATHECAL PUMP REVISION  . TOE SURGERY     then revision 8/18    Family History  Problem Relation Age of Onset  . Lung cancer Mother   . Diabetes Father   . Heart disease Maternal Grandfather   . Diabetes Paternal Grandmother   . Colon cancer Paternal Grandfather   . Stroke Neg Hx    Social History:  reports that she quit smoking about 11 years ago. Her smoking use included cigarettes. She has a 48.00 pack-year smoking history. She has never used smokeless tobacco. She reports current alcohol use. She reports that she does not use drugs.  Allergies: No Known Allergies  Medications Prior to Admission  Medication Sig Dispense Refill  . Ascorbic Acid (VITAMIN C GUMMIE PO) Take 2 tablets by mouth daily.    Marland Kitchen aspirin 81 MG tablet Take 81 mg by mouth daily.    Marland Kitchen buPROPion (WELLBUTRIN XL) 150 MG 24 hr tablet TAKE 1 TABLET BY MOUTH EVERY DAY (Patient taking differently: Take 150 mg by mouth daily. ) 90 tablet 0  . Cinnamon 500 MG TABS Take 1,000 mg by mouth daily.    . Coenzyme Q10 (CO Q 10) 100 MG CAPS Take 100 mg by mouth daily.     Marland Kitchen glipiZIDE (GLUCOTROL XL) 2.5 MG 24 hr tablet  TAKE 1 TABLET BY MOUTH DAILY WITH BREAKFAST. SCHEDULE ANNUAL EXAM (Patient taking differently: Take 2.5 mg by mouth daily with breakfast. ) 90 tablet 0  . glucosamine-chondroitin 500-400 MG tablet Take 2 tablets by mouth daily.     . Magnesium 250 MG TABS Take 250 mg by mouth daily.    . Misc Natural Products (TART CHERRY ADVANCED PO) Take 1,200 mg by mouth at bedtime.     . Multiple Vitamin (MULTIVITAMIN) tablet Take 1 tablet by mouth daily.    . NONFORMULARY OR COMPOUNDED ITEM 147.95 mcg by Epidural  Infusion route Continuous EPIDURAL. Medtronic Neuromodulation pump Fentanyl 147.95 mcg, Baclofen, bupivicaine    . NONFORMULARY OR COMPOUNDED ITEM 88.77 mcg by Epidural Infusion route Continuous EPIDURAL. Medtronic Neuromodulation pump: Fentanyl, baclofen 88.77, bupivicane    . NONFORMULARY OR COMPOUNDED ITEM 5.918 mg by Epidural Infusion route Continuous EPIDURAL. Medtronic Neuromodulation pump: Fentanyl, Baclofen, Bupivicaine 5.918 mg    . Omega-3 Fatty Acids (FISH OIL) 1000 MG CAPS Take 2,000 mg by mouth 2 (two) times daily.     Marland Kitchen oxyCODONE (OXY IR/ROXICODONE) 5 MG immediate release tablet Take 1 tablet (5 mg total) by mouth every 8 (eight) hours as needed for up to 30 days for severe pain. Must last 30 days. 90 tablet 0  . oxyCODONE (OXY IR/ROXICODONE) 5 MG immediate release tablet Take 1 tablet (5 mg total) by mouth every 8 (eight) hours as needed for up to 30 days for severe pain. Must last 30 days. 90 tablet 0  . PARoxetine (PAXIL) 20 MG tablet TAKE 0.5 TABLET BY MOUTH IN THE MORNING (Patient taking differently: Take 10 mg by mouth daily. TAKE 0.5 TABLET BY MOUTH IN THE MORNING) 45 tablet 0  . polyethylene glycol (MIRALAX / GLYCOLAX) packet Take 17 g by mouth every other day.     . pregabalin (LYRICA) 100 MG capsule Take 1 capsule (100 mg total) by mouth 3 (three) times daily. 270 capsule 0  . rosuvastatin (CRESTOR) 20 MG tablet Take 1 tablet (20 mg total) by mouth daily. 30 tablet 11  .  tiZANidine (ZANAFLEX) 4 MG tablet Take 1 tablet (4 mg total) by mouth 3 (three) times daily. 90 tablet 2  . Turmeric 500 MG TABS Take 500 mg by mouth 2 (two) times daily.     Marland Kitchen VITAMIN E PO Take 450 Units by mouth daily.     Marland Kitchen ACCU-CHEK AVIVA PLUS test strip TEST 1 TIME DAILY **E11.9** 200 each 2  . ACCU-CHEK SOFTCLIX LANCETS lancets USE TO CHECK BLOOD SUGAR 1 TIME DAILY. DX: E11.42 100 each 2  . AMBULATORY NON FORMULARY MEDICATION Medication Name: CPAP MASK OF CHOICE FOR HOME DEVICE 1 each 0  . naloxone (NARCAN) 2 MG/2ML injection Inject 1 mL (1 mg total) into the muscle as needed for up to 2 doses (for opioid overdose). Inject content of syringe into thigh muscle. Call 911. 2 Syringe 1  . nitroGLYCERIN (NITROSTAT) 0.4 MG SL tablet Place 1 tablet (0.4 mg total) under the tongue every 5 (five) minutes as needed for chest pain. 25 tablet prn  . oxyCODONE (OXY IR/ROXICODONE) 5 MG immediate release tablet Take 1 tablet (5 mg total) by mouth every 8 (eight) hours as needed for up to 30 days for severe pain. Must last 30 days. 90 tablet 0    Results for orders placed or performed during the hospital encounter of 12/10/18 (from the past 48 hour(s))  Surgical pcr screen     Status: Abnormal   Collection Time: 12/10/18 12:51 PM   Specimen: Nasal Mucosa; Nasal Swab  Result Value Ref Range   MRSA, PCR NEGATIVE NEGATIVE   Staphylococcus aureus POSITIVE (A) NEGATIVE    Comment: (NOTE) The Xpert SA Assay (FDA approved for NASAL specimens in patients 32 years of age and older), is one component of a comprehensive surveillance program. It is not intended to diagnose infection nor to guide or monitor treatment. Performed at [facility], [place] [address] [place], [place] [address]   Hemoglobin A1c     Status: Abnormal   Collection  Time: 12/10/18  1:30 PM  Result Value Ref Range   Hgb A1c MFr Bld 6.0 (H) 4.8 - 5.6 %    Comment: (NOTE) Pre diabetes:          5.7%-6.4% Diabetes:               >6.4% Glycemic control for   <7.0% adults with diabetes    Mean Plasma Glucose 125.5 mg/dL    Comment: Performed at [facility] [address], [place], [place] [address]  Basic metabolic panel     Status: Abnormal   Collection Time: 12/10/18  1:30 PM  Result Value Ref Range   Sodium 143 135 - 145 mmol/L   Potassium 3.9 3.5 - 5.1 mmol/L   Chloride 105 98 - 111 mmol/L   CO2 29 22 - 32 mmol/L   Glucose, Bld 59 (L) 70 - 99 mg/dL   BUN 25 (H) 8 - 23 mg/dL   Creatinine, Ser 1.04 (H) 0.44 - 1.00 mg/dL   Calcium 9.6 8.9 - 10.3 mg/dL   GFR calc non Af Amer 58 (L) >60 mL/min   GFR calc Af Amer >60 >60 mL/min   Anion gap 9 5 - 15    Comment: Performed at [facility], [place] [address] [place], [place] [address]  CBC     Status: None   Collection Time: 12/10/18  1:30 PM  Result Value Ref Range   WBC 6.3 4.0 - 10.5 K/uL   RBC 4.85 3.87 - 5.11 MIL/uL   Hemoglobin 13.8 12.0 - 15.0 g/dL   HCT 42.4 36.0 - 46.0 %   MCV 87.4 80.0 - 100.0 fL   MCH 28.5 26.0 - 34.0 pg   MCHC 32.5 30.0 - 36.0 g/dL   RDW 12.5 11.5 - 15.5 %   Platelets 204 150 - 400 K/uL   nRBC 0.0 0.0 - 0.2 %    Comment: Performed at [facility], [place] [address] [place], [place] [address]  Glucose, capillary     Status: Abnormal   Collection Time: 12/10/18  1:37 PM  Result Value Ref Range   Glucose-Capillary 51 (L) 70 - 99 mg/dL   Comment 1 Notify RN    Comment 2 Document in Chart   Type and screen     Status: None   Collection Time: 12/10/18  1:40 PM  Result Value Ref Range   ABO/RH(D) O POS    Antibody Screen NEG    Sample Expiration 12/24/2018,2359    Extend sample reason      NO TRANSFUSIONS OR PREGNANCY IN THE PAST 3 MONTHS Performed at [facility], [place] [address], [place], [place] [address]   Glucose, capillary     Status: None   Collection Time: 12/10/18  1:56 PM  Result Value Ref Range   Glucose-Capillary 78 70 - 99 mg/dL   Comment 1 Notify RN    Comment 2 Document in Chart    No  results found.  Review of Systems  Constitutional: Negative.   HENT: Negative.   Eyes: Negative.   Respiratory: Negative.   Cardiovascular: Negative.   Gastrointestinal: Negative.   Genitourinary: Negative.   Musculoskeletal: Negative.   Skin: Negative.   Neurological: Negative.   Endo/Heme/Allergies: Negative.   Psychiatric/Behavioral: Negative.     Blood pressure 139/84, pulse 64, temperature 98.4 F (36.9 C), temperature source Oral, resp. rate 18, height 5\' 10"  (1.778 m), weight 97.1 kg, SpO2 97 %. Physical Exam  Constitutional: She is oriented to person, place, and time. She appears well-developed and well-nourished.  HENT:  Head: Normocephalic and atraumatic.  Eyes: Pupils are equal, round, and reactive to light. EOM are normal.  Neck: Normal range of motion.  Cardiovascular: Normal rate.  Respiratory: Effort normal.  Musculoskeletal: Normal range of motion.  Neurological: She is alert and oriented to person, place, and time.  Skin: Skin is warm and dry.  Psychiatric: She has a normal mood and affect. Her behavior is normal. Thought content normal.     Assessment/Plan  relatively recent intrathecal pump replacement/revision, with seroma formation likely. Plan: Exploration, imbrication, sclerosis of intrathecal pump pocket with doxycycline.  Bonna Gains, MD 12/12/2018, 7:32 AM

## 2018-12-11 NOTE — Anesthesia Preprocedure Evaluation (Addendum)
Anesthesia Evaluation  Patient identified by MRN, date of birth, ID band Patient awake    Reviewed: Allergy & Precautions, NPO status , Patient's Chart, lab work & pertinent test results  Airway Mallampati: II  TM Distance: >3 FB Neck ROM: Full    Dental no notable dental hx. (+) Teeth Intact   Pulmonary sleep apnea , former smoker,    Pulmonary exam normal breath sounds clear to auscultation       Cardiovascular hypertension, + CAD  Normal cardiovascular exam+ dysrhythmias  Rhythm:Regular Rate:Normal     Neuro/Psych Depression  Neuromuscular disease    GI/Hepatic   Endo/Other  diabetes, Type 2  Renal/GU Renal disease     Musculoskeletal  (+) Arthritis , Fibromyalgia -  Abdominal   Peds  Hematology  (+) Blood dyscrasia, anemia ,   Anesthesia Other Findings   Reproductive/Obstetrics                                                            Anesthesia Evaluation  Patient identified by MRN, date of birth, ID band Patient awake    Reviewed: Allergy & Precautions, NPO status , Patient's Chart, lab work & pertinent test results  Airway Mallampati: III  TM Distance: >3 FB Neck ROM: Full    Dental no notable dental hx. (+) Teeth Intact   Pulmonary sleep apnea and Continuous Positive Airway Pressure Ventilation , former smoker,    Pulmonary exam normal breath sounds clear to auscultation       Cardiovascular hypertension, Pt. on medications + CAD and + Cardiac Stents  Normal cardiovascular exam+ dysrhythmias  Rhythm:Regular Rate:Normal  Hx/o WPW s/p ablation DES x 1 2009   Neuro/Psych PSYCHIATRIC DISORDERS Anxiety Depression Restless legs syndrome  Neuromuscular disease    GI/Hepatic negative GI ROS, Neg liver ROS,   Endo/Other  diabetes, Well Controlled, Type 2, Oral Hypoglycemic AgentsHyperlipidemia  Renal/GU Renal disease  negative genitourinary    Musculoskeletal  (+) Arthritis , Osteoarthritis,  Fibromyalgia -Chronic back and neck pain Failed cervical fusion Lumbar spinal stenosis Lumbar facet hypertrophy Lumbar foraminal stenosis Intrathecal pump in place Scoliosis   Abdominal (+) + obese,   Peds  Hematology  (+) anemia ,   Anesthesia Other Findings   Reproductive/Obstetrics                            Anesthesia Physical Anesthesia Plan  ASA: III  Anesthesia Plan: General   Post-op Pain Management:    Induction: Intravenous  PONV Risk Score and Plan: 4 or greater and Ondansetron, Dexamethasone and Treatment may vary due to age or medical condition  Airway Management Planned: Oral ETT  Additional Equipment:   Intra-op Plan:   Post-operative Plan: Extubation in OR  Informed Consent: I have reviewed the patients History and Physical, chart, labs and discussed the procedure including the risks, benefits and alternatives for the proposed anesthesia with the patient or authorized representative who has indicated his/her understanding and acceptance.     Dental advisory given  Plan Discussed with: CRNA and Surgeon  Anesthesia Plan Comments: (See PAT note by Karoline Caldwell, PA-C )       Anesthesia Quick Evaluation  Anesthesia Evaluation  Patient identified by MRN, date of birth, ID band Patient awake    Reviewed: Allergy & Precautions, NPO status , Patient's Chart, lab work & pertinent test results  Airway Mallampati: III  TM Distance: >3 FB Neck ROM: Full    Dental no notable dental hx. (+) Teeth Intact   Pulmonary sleep apnea and Continuous Positive Airway Pressure Ventilation , former smoker,    Pulmonary exam normal breath sounds clear to auscultation       Cardiovascular hypertension, Pt. on medications + CAD and + Cardiac Stents  Normal cardiovascular exam+ dysrhythmias  Rhythm:Regular Rate:Normal  Hx/o WPW s/p  ablation DES x 1 2009   Neuro/Psych PSYCHIATRIC DISORDERS Anxiety Depression Restless legs syndrome  Neuromuscular disease    GI/Hepatic negative GI ROS, Neg liver ROS,   Endo/Other  diabetes, Well Controlled, Type 2, Oral Hypoglycemic AgentsHyperlipidemia  Renal/GU Renal disease  negative genitourinary   Musculoskeletal  (+) Arthritis , Osteoarthritis,  Fibromyalgia -Chronic back and neck pain Failed cervical fusion Lumbar spinal stenosis Lumbar facet hypertrophy Lumbar foraminal stenosis Intrathecal pump in place Scoliosis   Abdominal (+) + obese,   Peds  Hematology  (+) anemia ,   Anesthesia Other Findings   Reproductive/Obstetrics                          Lab Results  Component Value Date   CREATININE 1.04 (H) 12/10/2018   BUN 25 (H) 12/10/2018   NA 143 12/10/2018   K 3.9 12/10/2018   CL 105 12/10/2018   CO2 29 12/10/2018    Lab Results  Component Value Date   WBC 6.3 12/10/2018   HGB 13.8 12/10/2018   HCT 42.4 12/10/2018   MCV 87.4 12/10/2018   PLT 204 12/10/2018    Anesthesia Physical Anesthesia Plan  ASA: III  Anesthesia Plan: General   Post-op Pain Management:    Induction: Intravenous  PONV Risk Score and Plan: 4 or greater and Ondansetron, Dexamethasone and Treatment may vary due to age or medical condition  Airway Management Planned: Oral ETT  Additional Equipment:   Intra-op Plan:   Post-operative Plan: Extubation in OR  Informed Consent: I have reviewed the patients History and Physical, chart, labs and discussed the procedure including the risks, benefits and alternatives for the proposed anesthesia with the patient or authorized representative who has indicated his/her understanding and acceptance.     Dental advisory given  Plan Discussed with: CRNA and Surgeon  Anesthesia Plan Comments: (See PAT note by Karoline Caldwell, PA-C )       Anesthesia Quick Evaluation  Anesthesia Physical Anesthesia  Plan  ASA: III  Anesthesia Plan: General   Post-op Pain Management:    Induction: Intravenous  PONV Risk Score and Plan: Treatment may vary due to age or medical condition, Promethazine, Ondansetron and Dexamethasone  Airway Management Planned: Oral ETT  Additional Equipment:   Intra-op Plan:   Post-operative Plan: Extubation in OR  Informed Consent: I have reviewed the patients History and Physical, chart, labs and discussed the procedure including the risks, benefits and alternatives for the proposed anesthesia with the patient or authorized representative who has indicated his/her understanding and acceptance.     Dental advisory given  Plan Discussed with:   Anesthesia Plan Comments: (See PAT note 08/08/18 by Karoline Caldwell, PA-C. No significant interim health changes.  )      Anesthesia Quick Evaluation

## 2018-12-12 ENCOUNTER — Other Ambulatory Visit: Payer: Self-pay

## 2018-12-12 ENCOUNTER — Encounter (HOSPITAL_COMMUNITY)
Admission: RE | Disposition: A | Discharge status: Home / Self Care | Payer: Self-pay | Source: Home / Self Care | Attending: Anesthesiology

## 2018-12-12 ENCOUNTER — Ambulatory Visit (HOSPITAL_COMMUNITY): Payer: Medicare Other | Admitting: Physician Assistant

## 2018-12-12 ENCOUNTER — Ambulatory Visit (HOSPITAL_COMMUNITY)
Admission: RE | Admit: 2018-12-12 | Discharge: 2018-12-12 | Disposition: A | Discharge status: Home / Self Care | Payer: Medicare Other | Attending: Anesthesiology | Admitting: Anesthesiology

## 2018-12-12 ENCOUNTER — Ambulatory Visit (HOSPITAL_COMMUNITY): Payer: Medicare Other | Admitting: Certified Registered Nurse Anesthetist

## 2018-12-12 DIAGNOSIS — Z981 Arthrodesis status: Secondary | ICD-10-CM | POA: Insufficient documentation

## 2018-12-12 DIAGNOSIS — Z87891 Personal history of nicotine dependence: Secondary | ICD-10-CM | POA: Insufficient documentation

## 2018-12-12 DIAGNOSIS — G4730 Sleep apnea, unspecified: Secondary | ICD-10-CM | POA: Diagnosis not present

## 2018-12-12 DIAGNOSIS — T85890A Other specified complication of nervous system prosthetic devices, implants and grafts, initial encounter: Secondary | ICD-10-CM | POA: Diagnosis not present

## 2018-12-12 DIAGNOSIS — I2510 Atherosclerotic heart disease of native coronary artery without angina pectoris: Secondary | ICD-10-CM | POA: Insufficient documentation

## 2018-12-12 DIAGNOSIS — Z7984 Long term (current) use of oral hypoglycemic drugs: Secondary | ICD-10-CM | POA: Insufficient documentation

## 2018-12-12 DIAGNOSIS — Z8614 Personal history of Methicillin resistant Staphylococcus aureus infection: Secondary | ICD-10-CM | POA: Diagnosis not present

## 2018-12-12 DIAGNOSIS — Y831 Surgical operation with implant of artificial internal device as the cause of abnormal reaction of the patient, or of later complication, without mention of misadventure at the time of the procedure: Secondary | ICD-10-CM | POA: Insufficient documentation

## 2018-12-12 DIAGNOSIS — N189 Chronic kidney disease, unspecified: Secondary | ICD-10-CM | POA: Insufficient documentation

## 2018-12-12 DIAGNOSIS — Z7982 Long term (current) use of aspirin: Secondary | ICD-10-CM | POA: Diagnosis not present

## 2018-12-12 DIAGNOSIS — G8929 Other chronic pain: Secondary | ICD-10-CM | POA: Insufficient documentation

## 2018-12-12 DIAGNOSIS — I129 Hypertensive chronic kidney disease with stage 1 through stage 4 chronic kidney disease, or unspecified chronic kidney disease: Secondary | ICD-10-CM | POA: Insufficient documentation

## 2018-12-12 DIAGNOSIS — Z451 Encounter for adjustment and management of infusion pump: Secondary | ICD-10-CM | POA: Diagnosis not present

## 2018-12-12 DIAGNOSIS — E1140 Type 2 diabetes mellitus with diabetic neuropathy, unspecified: Secondary | ICD-10-CM | POA: Insufficient documentation

## 2018-12-12 DIAGNOSIS — E1122 Type 2 diabetes mellitus with diabetic chronic kidney disease: Secondary | ICD-10-CM | POA: Insufficient documentation

## 2018-12-12 DIAGNOSIS — G8918 Other acute postprocedural pain: Secondary | ICD-10-CM | POA: Diagnosis not present

## 2018-12-12 DIAGNOSIS — G894 Chronic pain syndrome: Secondary | ICD-10-CM | POA: Diagnosis not present

## 2018-12-12 DIAGNOSIS — Z89421 Acquired absence of other right toe(s): Secondary | ICD-10-CM | POA: Insufficient documentation

## 2018-12-12 DIAGNOSIS — Z955 Presence of coronary angioplasty implant and graft: Secondary | ICD-10-CM | POA: Diagnosis not present

## 2018-12-12 DIAGNOSIS — E7800 Pure hypercholesterolemia, unspecified: Secondary | ICD-10-CM | POA: Diagnosis not present

## 2018-12-12 DIAGNOSIS — F329 Major depressive disorder, single episode, unspecified: Secondary | ICD-10-CM | POA: Insufficient documentation

## 2018-12-12 DIAGNOSIS — Z79899 Other long term (current) drug therapy: Secondary | ICD-10-CM | POA: Insufficient documentation

## 2018-12-12 HISTORY — PX: INTRATHECAL PUMP REVISION: SHX6810

## 2018-12-12 LAB — GLUCOSE, CAPILLARY
Glucose-Capillary: 91 mg/dL (ref 70–99)
Glucose-Capillary: 109 mg/dL — ABNORMAL HIGH (ref 70–99)

## 2018-12-12 SURGERY — INTRATHECAL PUMP REVISION
Anesthesia: General

## 2018-12-12 MED ORDER — ROCURONIUM BROMIDE 10 MG/ML (PF) SYRINGE
PREFILLED_SYRINGE | INTRAVENOUS | Status: DC | PRN
  Administered 2018-12-12: 60 mg via INTRAVENOUS

## 2018-12-12 MED ORDER — CHLORHEXIDINE GLUCONATE CLOTH 2 % EX PADS: 6.0000 | MEDICATED_PAD | Freq: Once | CUTANEOUS | Status: DC

## 2018-12-12 MED ORDER — CEFAZOLIN SODIUM-DEXTROSE 2-4 GM/100ML-% IV SOLN
2.0000 g | INTRAVENOUS | Status: AC
  Administered 2018-12-12: 2 g via INTRAVENOUS

## 2018-12-12 MED ORDER — 0.9 % SODIUM CHLORIDE (POUR BTL) OPTIME
TOPICAL | Status: DC | PRN
  Administered 2018-12-12: 1000 mL

## 2018-12-12 MED ORDER — DEXAMETHASONE SODIUM PHOSPHATE 10 MG/ML IJ SOLN
INTRAMUSCULAR | Status: DC | PRN
  Administered 2018-12-12: 10 mg via INTRAVENOUS

## 2018-12-12 MED ORDER — FENTANYL CITRATE (PF) 250 MCG/5ML IJ SOLN
INTRAMUSCULAR | Status: DC | PRN
  Administered 2018-12-12: 100 ug via INTRAVENOUS

## 2018-12-12 MED ORDER — FENTANYL CITRATE (PF) 250 MCG/5ML IJ SOLN
INTRAMUSCULAR | Status: AC
  Filled 2018-12-12: qty 5

## 2018-12-12 MED ORDER — OXYCODONE-ACETAMINOPHEN 10-325 MG PO TABS: 1.0000 | ORAL_TABLET | ORAL | 0 refills | Status: DC | PRN

## 2018-12-12 MED ORDER — MEPERIDINE HCL 25 MG/ML IJ SOLN: 6.2500 mg | INTRAMUSCULAR | Status: DC | PRN

## 2018-12-12 MED ORDER — ACETAMINOPHEN 10 MG/ML IV SOLN: 1000.0000 mg | Freq: Once | INTRAVENOUS | Status: DC | PRN

## 2018-12-12 MED ORDER — ONDANSETRON HCL 4 MG/2ML IJ SOLN
INTRAMUSCULAR | Status: DC | PRN
  Administered 2018-12-12: 4 mg via INTRAVENOUS

## 2018-12-12 MED ORDER — PROPOFOL 10 MG/ML IV BOLUS
INTRAVENOUS | Status: DC | PRN
  Administered 2018-12-12: 190 mg via INTRAVENOUS

## 2018-12-12 MED ORDER — SUGAMMADEX SODIUM 200 MG/2ML IV SOLN
INTRAVENOUS | Status: DC | PRN
  Administered 2018-12-12: 200 mg via INTRAVENOUS

## 2018-12-12 MED ORDER — HYDROMORPHONE HCL 1 MG/ML IJ SOLN
INTRAMUSCULAR | Status: AC
  Filled 2018-12-12: qty 1

## 2018-12-12 MED ORDER — HYDROMORPHONE HCL 1 MG/ML IJ SOLN
0.2500 mg | INTRAMUSCULAR | Status: DC | PRN
  Administered 2018-12-12: 0.25 mg via INTRAVENOUS
  Administered 2018-12-12: 0.5 mg via INTRAVENOUS
  Administered 2018-12-12: 0.25 mg via INTRAVENOUS

## 2018-12-12 MED ORDER — STERILE WATER FOR INJECTION IJ SOLN
INTRAVENOUS | Status: DC | PRN
  Administered 2018-12-12: 200 mg

## 2018-12-12 MED ORDER — MIDAZOLAM HCL 2 MG/2ML IJ SOLN
INTRAMUSCULAR | Status: DC | PRN
  Administered 2018-12-12: 2 mg via INTRAVENOUS

## 2018-12-12 MED ORDER — BUPIVACAINE-EPINEPHRINE (PF) 0.5% -1:200000 IJ SOLN
INTRAMUSCULAR | Status: AC
  Filled 2018-12-12: qty 30

## 2018-12-12 MED ORDER — CEFAZOLIN SODIUM-DEXTROSE 2-4 GM/100ML-% IV SOLN
INTRAVENOUS | Status: AC
  Filled 2018-12-12: qty 100

## 2018-12-12 MED ORDER — SODIUM CHLORIDE 0.9 % IV SOLN
INTRAVENOUS | Status: DC | PRN
  Administered 2018-12-12: 500 mL

## 2018-12-12 MED ORDER — PROMETHAZINE HCL 25 MG/ML IJ SOLN: 6.2500 mg | INTRAMUSCULAR | Status: DC | PRN

## 2018-12-12 MED ORDER — BUPIVACAINE-EPINEPHRINE (PF) 0.5% -1:200000 IJ SOLN
INTRAMUSCULAR | Status: DC | PRN
  Administered 2018-12-12: 10 mL via PERINEURAL

## 2018-12-12 MED ORDER — HYDROCODONE-ACETAMINOPHEN 7.5-325 MG PO TABS
1.0000 | ORAL_TABLET | Freq: Once | ORAL | Status: AC | PRN
  Administered 2018-12-12: 1 via ORAL

## 2018-12-12 MED ORDER — MIDAZOLAM HCL 2 MG/2ML IJ SOLN
INTRAMUSCULAR | Status: AC
  Filled 2018-12-12: qty 2

## 2018-12-12 MED ORDER — EPHEDRINE SULFATE-NACL 50-0.9 MG/10ML-% IV SOSY
PREFILLED_SYRINGE | INTRAVENOUS | Status: DC | PRN
  Administered 2018-12-12 (×2): 10 mg via INTRAVENOUS

## 2018-12-12 MED ORDER — LIDOCAINE 2% (20 MG/ML) 5 ML SYRINGE
INTRAMUSCULAR | Status: DC | PRN
  Administered 2018-12-12: 100 mg via INTRAVENOUS

## 2018-12-12 MED ORDER — STERILE WATER FOR INJECTION IJ SOLN
200.0000 mg | Freq: Once | INTRAVENOUS | Status: DC
  Filled 2018-12-12: qty 200

## 2018-12-12 MED ORDER — HYDROCODONE-ACETAMINOPHEN 7.5-325 MG PO TABS
ORAL_TABLET | ORAL | Status: AC
  Filled 2018-12-12: qty 1

## 2018-12-12 MED ORDER — LACTATED RINGERS IV SOLN
INTRAVENOUS | Status: DC | PRN
  Administered 2018-12-12: 07:00:00 via INTRAVENOUS

## 2018-12-12 SURGICAL SUPPLY — 52 items
ADH SKN CLS APL DERMABOND .7 (GAUZE/BANDAGES/DRESSINGS) ×1
BAG DECANTER FOR FLEXI CONT (MISCELLANEOUS) ×2 IMPLANT
BINDER ABDOMINAL 12 ML 46-62 (SOFTGOODS) IMPLANT
BLADE CLIPPER SURG (BLADE) IMPLANT
BOOT SUTURE AID YELLOW STND (SUTURE) IMPLANT
CHLORAPREP W/TINT 26ML (MISCELLANEOUS) ×2 IMPLANT
COVER WAND RF STERILE (DRAPES) ×1 IMPLANT
DERMABOND ADVANCED (GAUZE/BANDAGES/DRESSINGS) ×1
DERMABOND ADVANCED .7 DNX12 (GAUZE/BANDAGES/DRESSINGS) IMPLANT
DRAPE LAPAROTOMY 100X72X124 (DRAPES) ×2 IMPLANT
DRAPE POUCH INSTRU U-SHP 10X18 (DRAPES) ×1 IMPLANT
DRAPE SURG 17X23 STRL (DRAPES) ×8 IMPLANT
DRSG COVADERM 4X8 (GAUZE/BANDAGES/DRESSINGS) ×1 IMPLANT
DRSG OPSITE POSTOP 4X8 (GAUZE/BANDAGES/DRESSINGS) ×1 IMPLANT
ELECT REM PT RETURN 9FT ADLT (ELECTROSURGICAL) ×2
ELECTRODE REM PT RTRN 9FT ADLT (ELECTROSURGICAL) ×1 IMPLANT
GAUZE 4X4 16PLY RFD (DISPOSABLE) IMPLANT
GLOVE ECLIPSE 7.5 STRL STRAW (GLOVE) ×2 IMPLANT
GLOVE EXAM NITRILE LRG STRL (GLOVE) IMPLANT
GLOVE EXAM NITRILE XL STR (GLOVE) IMPLANT
GLOVE EXAM NITRILE XS STR PU (GLOVE) IMPLANT
GOWN STRL REUS W/ TWL LRG LVL3 (GOWN DISPOSABLE) IMPLANT
GOWN STRL REUS W/ TWL XL LVL3 (GOWN DISPOSABLE) IMPLANT
GOWN STRL REUS W/TWL 2XL LVL3 (GOWN DISPOSABLE) IMPLANT
GOWN STRL REUS W/TWL LRG LVL3 (GOWN DISPOSABLE)
GOWN STRL REUS W/TWL XL LVL3 (GOWN DISPOSABLE)
KIT BASIN OR (CUSTOM PROCEDURE TRAY) ×2 IMPLANT
KIT TURNOVER KIT B (KITS) ×2 IMPLANT
NDL 18GX1X1/2 (RX/OR ONLY) (NEEDLE) IMPLANT
NDL HYPO 25X1 1.5 SAFETY (NEEDLE) ×1 IMPLANT
NEEDLE 18GX1X1/2 (RX/OR ONLY) (NEEDLE) ×2 IMPLANT
NEEDLE HYPO 25X1 1.5 SAFETY (NEEDLE) ×2 IMPLANT
NS IRRIG 1000ML POUR BTL (IV SOLUTION) ×2 IMPLANT
PACK LAMINECTOMY NEURO (CUSTOM PROCEDURE TRAY) ×2 IMPLANT
PAD ARMBOARD 7.5X6 YLW CONV (MISCELLANEOUS) ×2 IMPLANT
SPONGE LAP 4X18 RFD (DISPOSABLE) IMPLANT
SPONGE SURGIFOAM ABS GEL SZ50 (HEMOSTASIS) IMPLANT
STAPLER SKIN PROX WIDE 3.9 (STAPLE) ×2 IMPLANT
SUT MNCRL AB 3-0 PS2 18 (SUTURE) ×1 IMPLANT
SUT MNCRL AB 4-0 PS2 18 (SUTURE) ×2 IMPLANT
SUT PROLENE 0 CT 1 30 (SUTURE) ×4 IMPLANT
SUT SILK 0 (SUTURE) ×2
SUT SILK 0 MO-6 18XCR BRD 8 (SUTURE) IMPLANT
SUT SILK 2 0 PERMA HAND 18 BK (SUTURE) ×4 IMPLANT
SUT VIC AB 1 CT1 18XBRD ANBCTR (SUTURE) IMPLANT
SUT VIC AB 1 CT1 8-18 (SUTURE) ×4
SUT VIC AB 2-0 CP2 18 (SUTURE) ×4 IMPLANT
SYR 3ML LL SCALE MARK (SYRINGE) ×2 IMPLANT
TOWEL GREEN STERILE (TOWEL DISPOSABLE) ×2 IMPLANT
TOWEL GREEN STERILE FF (TOWEL DISPOSABLE) ×2 IMPLANT
WATER STERILE IRR 1000ML POUR (IV SOLUTION) ×2 IMPLANT
YANKAUER SUCT BULB TIP NO VENT (SUCTIONS) ×2 IMPLANT

## 2018-12-12 NOTE — Transfer of Care (Signed)
Immediate Anesthesia Transfer of Care Note  Patient: Melinda [name]  Procedure(s) Performed: Intrathecal pump revision with exploration of pocket (N/A )  Patient Location: PACU  Anesthesia Type:General  Level of Consciousness: drowsy and patient cooperative  Airway & Oxygen Therapy: Patient Spontanous Breathing  Post-op Assessment: Report given to RN, Post -op Vital signs reviewed and stable and Patient moving all extremities X 4  Post vital signs: Reviewed and stable  Last Vitals:  Vitals Value Taken Time  BP 139/60 12/12/18 0902  Temp    Pulse 85 12/12/18 0904  Resp 17 12/12/18 0904  SpO2 95 % 12/12/18 0904  Vitals shown include unvalidated device data.  Last Pain:  Vitals:   12/12/18 0545  TempSrc: Oral         Complications: No apparent anesthesia complications

## 2018-12-12 NOTE — Op Note (Signed)
PREOP DX: chronic pain treated with intrathecal drug therapy; pump unanchored; seroma  POSTOP DX: same PROCEDURE: revision of IT pump; sclerosis SURGEON: Katye Valek ASSISTANT: none ANESTHESIA: General, endotracheal FINDINGS:  100 cc clear, straw-colored aspirate EBL: minimal  Specimens: Gram stain and cultures sent though expected to be unremarkable  DESCRIPTION OF PROCEDURE: Patient was brought to the operating room andanesthesia induced by the anesthesia team. Patient was positioned, padded.  Right lower abdominalquadrant  prepped withchloraprepscrub and draped in the usual sterile fashion.Timeout taken.  Area of previous incisions were infiltrated with marcaine. Prior incision was reopened and only 100 cc of clear, straw-colored fluid aspirated.  Gram stain and cultures sent.  The old pain pump was externalized.Pocket was inspected, and found to be unremarkable, with good scar tissue evolved on all surfaces. 0 prolene sutures were placed in the fascia, to correspond to the 4 attachment "corners" on the pump and tied.   A second bite was taken through the fascia, through the corner tie of the pump, and retied.  With the pump internalized with the tip in the 12 o'clock position, 0 silk sutures were used to imbricate residual tissue and dead space, along the superior aspect of the pocket, thus reducing the dead space to is near zero as possible. Wound was irrigated, and a deep layer of 0 Vicryl interrupted sutures placed.    20 cc of a saline solution then containing 200 mg doxycycline were introduced as a sclerosing agent.  Superficial fascial layer was then closed with interrupted 2-0 Vicryl sutures, and the skin closed with a running 3-0 Monocryl subcuticular stitch, Dermabond  Sterile dressings were placed. COMPLICATIONS: none CONDITION: stable during procedure, and to PACU DISPOSITION: home with additional pain medication. F/u 2 weeks for wound check.

## 2018-12-12 NOTE — Anesthesia Postprocedure Evaluation (Signed)
Anesthesia Post Note  Patient: Melinda [name]  Procedure(s) Performed: Intrathecal pump revision with exploration of pocket (N/A )     Patient location during evaluation: PACU Anesthesia Type: General Level of consciousness: awake and alert Pain management: pain level controlled Vital Signs Assessment: post-procedure vital signs reviewed and stable Respiratory status: spontaneous breathing, nonlabored ventilation, respiratory function stable and patient connected to nasal cannula oxygen Cardiovascular status: blood pressure returned to baseline and stable Postop Assessment: no apparent nausea or vomiting Anesthetic complications: no    Last Vitals:  Vitals:   12/12/18 0930 12/12/18 0937  BP: (!) 151/80 (!) 151/80  Pulse: 79 82  Resp: (!) 21 16  Temp:    SpO2: 95% 97%    Last Pain:  Vitals:   12/12/18 0937  TempSrc:   PainSc: 3                  Barnet Glasgow

## 2018-12-12 NOTE — Discharge Instructions (Addendum)

## 2018-12-12 NOTE — Anesthesia Procedure Notes (Signed)
Procedure Name: Intubation Date/Time: 12/12/2018 7:52 AM Performed by: Julieta Bellini, CRNA Pre-anesthesia Checklist: Patient identified, Emergency Drugs available, Suction available and Patient being monitored Patient Re-evaluated:Patient Re-evaluated prior to induction Oxygen Delivery Method: Circle system utilized Preoxygenation: Pre-oxygenation with 100% oxygen Induction Type: IV induction Ventilation: Mask ventilation without difficulty Laryngoscope Size: Mac and 3 Grade View: Grade II Tube type: Oral Tube size: 7.0 mm Number of attempts: 1 Airway Equipment and Method: Stylet and Oral airway Placement Confirmation: ETT inserted through vocal cords under direct vision,  positive ETCO2 and breath sounds checked- equal and bilateral Secured at: 21 cm Tube secured with: Tape Dental Injury: Teeth and Oropharynx as per pre-operative assessment

## 2018-12-13 ENCOUNTER — Encounter (HOSPITAL_COMMUNITY): Payer: Self-pay | Admitting: Anesthesiology

## 2018-12-14 ENCOUNTER — Other Ambulatory Visit: Payer: Self-pay | Admitting: Internal Medicine

## 2018-12-16 ENCOUNTER — Encounter: Payer: Self-pay | Admitting: Pain Medicine

## 2018-12-16 DIAGNOSIS — G4733 Obstructive sleep apnea (adult) (pediatric): Secondary | ICD-10-CM

## 2018-12-17 DIAGNOSIS — Z6832 Body mass index (BMI) 32.0-32.9, adult: Secondary | ICD-10-CM | POA: Diagnosis not present

## 2018-12-17 DIAGNOSIS — R030 Elevated blood-pressure reading, without diagnosis of hypertension: Secondary | ICD-10-CM | POA: Diagnosis not present

## 2018-12-17 DIAGNOSIS — Z462 Encounter for fitting and adjustment of other devices related to nervous system and special senses: Secondary | ICD-10-CM | POA: Diagnosis not present

## 2018-12-17 LAB — AEROBIC/ANAEROBIC CULTURE W GRAM STAIN (SURGICAL/DEEP WOUND)
Culture: NO GROWTH
Gram Stain: NONE SEEN

## 2018-12-24 DIAGNOSIS — Z9689 Presence of other specified functional implants: Secondary | ICD-10-CM | POA: Diagnosis not present

## 2018-12-24 DIAGNOSIS — Z6832 Body mass index (BMI) 32.0-32.9, adult: Secondary | ICD-10-CM | POA: Diagnosis not present

## 2018-12-24 DIAGNOSIS — M5136 Other intervertebral disc degeneration, lumbar region: Secondary | ICD-10-CM | POA: Diagnosis not present

## 2019-01-07 DIAGNOSIS — L851 Acquired keratosis [keratoderma] palmaris et plantaris: Secondary | ICD-10-CM | POA: Diagnosis not present

## 2019-01-07 DIAGNOSIS — Z89421 Acquired absence of other right toe(s): Secondary | ICD-10-CM | POA: Diagnosis not present

## 2019-01-07 DIAGNOSIS — E1142 Type 2 diabetes mellitus with diabetic polyneuropathy: Secondary | ICD-10-CM | POA: Diagnosis not present

## 2019-01-15 ENCOUNTER — Other Ambulatory Visit: Payer: Self-pay | Admitting: Pain Medicine

## 2019-01-15 DIAGNOSIS — G8929 Other chronic pain: Secondary | ICD-10-CM

## 2019-01-15 DIAGNOSIS — M7918 Myalgia, other site: Secondary | ICD-10-CM

## 2019-01-27 ENCOUNTER — Other Ambulatory Visit: Payer: Self-pay

## 2019-01-27 MED ORDER — PAIN MANAGEMENT IT PUMP REFILL: 1.0000 | Freq: Once | INTRATHECAL | 0 refills | Status: AC

## 2019-01-29 ENCOUNTER — Encounter: Payer: Medicare Other | Admitting: Pain Medicine

## 2019-02-02 ENCOUNTER — Telehealth: Payer: Self-pay | Admitting: Pain Medicine

## 2019-02-02 ENCOUNTER — Encounter: Payer: Self-pay | Admitting: Pain Medicine

## 2019-02-02 NOTE — Telephone Encounter (Signed)
Patient needs a virtual visit for her med refill.

## 2019-02-02 NOTE — Telephone Encounter (Signed)
Patient lvmail on asking about refill on pregabalin. Last fill was 10-29-18.  Please call patient and let them know when can get refill.

## 2019-02-03 ENCOUNTER — Other Ambulatory Visit: Payer: Self-pay

## 2019-02-03 ENCOUNTER — Ambulatory Visit: Payer: Medicare Other | Attending: Pain Medicine | Admitting: Pain Medicine

## 2019-02-03 DIAGNOSIS — G9619 Other disorders of meninges, not elsewhere classified: Secondary | ICD-10-CM

## 2019-02-03 DIAGNOSIS — Z978 Presence of other specified devices: Secondary | ICD-10-CM | POA: Diagnosis not present

## 2019-02-03 DIAGNOSIS — G96198 Other disorders of meninges, not elsewhere classified: Secondary | ICD-10-CM

## 2019-02-03 DIAGNOSIS — M7918 Myalgia, other site: Secondary | ICD-10-CM

## 2019-02-03 DIAGNOSIS — G8929 Other chronic pain: Secondary | ICD-10-CM | POA: Diagnosis not present

## 2019-02-03 DIAGNOSIS — M961 Postlaminectomy syndrome, not elsewhere classified: Secondary | ICD-10-CM | POA: Diagnosis not present

## 2019-02-03 DIAGNOSIS — Z969 Presence of functional implant, unspecified: Secondary | ICD-10-CM

## 2019-02-03 DIAGNOSIS — G894 Chronic pain syndrome: Secondary | ICD-10-CM

## 2019-02-03 DIAGNOSIS — M797 Fibromyalgia: Secondary | ICD-10-CM

## 2019-02-03 DIAGNOSIS — M25561 Pain in right knee: Secondary | ICD-10-CM

## 2019-02-03 DIAGNOSIS — M5441 Lumbago with sciatica, right side: Secondary | ICD-10-CM | POA: Diagnosis not present

## 2019-02-03 MED ORDER — OXYCODONE HCL 5 MG PO TABS: 5.0000 mg | ORAL_TABLET | Freq: Three times a day (TID) | ORAL | 0 refills | Status: DC | PRN

## 2019-02-03 MED ORDER — TIZANIDINE HCL 4 MG PO TABS: 4.0000 mg | ORAL_TABLET | Freq: Three times a day (TID) | ORAL | 0 refills | Status: DC

## 2019-02-03 MED ORDER — PREGABALIN 150 MG PO CAPS: 150.0000 mg | ORAL_CAPSULE | Freq: Three times a day (TID) | ORAL | 0 refills | Status: DC

## 2019-02-03 NOTE — Progress Notes (Signed)
Pain Management Virtual Encounter Note - Virtual Visit via Telephone Telehealth (real-time audio visits between healthcare provider and patient).   Patient's Phone No. & Preferred Pharmacy:  [phone] (home); [phone] (mobile); (Preferred) [phone] [email or web address]  [facility], [place] [address] [place] [address] Phone: [phone] Fax: [phone]    Pre-screening note:  Our staff contacted Melinda [name] and offered her an "in person", "face-to-face" appointment versus a telephone encounter. She indicated preferring the telephone encounter, at this time.   Reason for Virtual Visit: COVID-19*  Social distancing based on CDC and AMA recommendations.   I contacted Melinda [name] on 02/03/2019 via telephone.      I clearly identified myself as Gaspar Cola, MD. I verified that I was speaking with the correct person using two identifiers (Name: Melinda [name], and date of birth: Jan 26, 1956).  Advanced Informed Consent I sought verbal advanced consent from Melinda [name] for virtual visit interactions. I informed Melinda [name] of possible security and privacy concerns, risks, and limitations associated with providing "not-in-person" medical evaluation and management services. I also informed Melinda [name] of the availability of "in-person" appointments. Finally, I informed her that there would be a charge for the virtual visit and that she could be  personally, fully or partially, financially responsible for it. Melinda [name] expressed understanding and agreed to proceed.   Historic Elements   Melinda [name] is a 63 y.o. year old, female patient evaluated today after her last encounter by our practice on 02/02/2019. Melinda [name]  has a past medical history of Amputation of great toe, right, traumatic ([place]) (05/30/2010), Amputation of second toe, right, traumatic ([place]) (09/2017), Anemia, Anxiety, Blue toes, Bulging disc, CAD (coronary  artery disease) (2009), Cardiac arrhythmia due to congenital heart disease, Chicken pox, Chronic fatigue, Chronic kidney disease, Coronary arteritis, Degenerative disc disease, Depression, Diabetes mellitus without complication ([place]), Facet joint disease, Fibromyalgia, Heart disease, Hyperlipidemia, IBS (irritable bowel syndrome), MCL deficiency, knee, MRSA (methicillin resistant staph aureus) culture positive (2011), Neuropathy (04/18/2010), Peripheral neuropathy, Restless leg syndrome, Sepsis ([place]) (09/2013), Sleep apnea (08/17/2003), and Spinal stenosis. She also  has a past surgical history that includes Foot surgery (Right); Hand surgery (Left); Gallbladder surgery; Ablation; Ablation; HEART STENT (2009); HAND SURGEY (Left); Foot surgery (Bilateral); Foot surgery (Right); Infusion pump implantation; Back surgery; Cholecystectomy (2003); Anterior cervical decomp/discectomy fusion (N/A, 07/28/2014); Carpal tunnel release (Right); Eye surgery (Bilateral, 2013); Amputation toe (Right, 02/01/2017); Irrigation and debridement foot (Right, 02/23/2017); Toe Surgery; Hammer toe surgery (Right, 10/17/2017); Intrathecal pump revision (N/A, 04/25/2018); Intrathecal pump revision (Right, 04/25/2018); Coronary angioplasty (2008); Pain pump revision (N/A, 08/15/2018); and Intrathecal pump revision (N/A, 12/12/2018). Melinda [name] has a current medication list which includes the following prescription(s): accu-chek aviva plus, accu-chek softclix lancets, AMBULATORY NON FORMULARY MEDICATION, ascorbic acid, aspirin, bupropion, cinnamon, co q 10, glucosamine-chondroitin, magnesium, misc natural products, multivitamin, naloxone, nitroglycerin, NONFORMULARY OR COMPOUNDED ITEM, NONFORMULARY OR COMPOUNDED ITEM, NONFORMULARY OR COMPOUNDED ITEM, fish oil, oxycodone, paroxetine, polyethylene glycol, pregabalin, rosuvastatin, tizanidine, turmeric, and vitamin e. She  reports that she quit smoking about 11 years ago. Her smoking use included  cigarettes. She has a 48.00 pack-year smoking history. She has never used smokeless tobacco. She reports current alcohol use. She reports that she does not use drugs. Melinda [name] has No Known Allergies.   HPI  Today, she is being contacted for medication management.  The patient indicates doing better after the pump revision.  However, she has noticed that there is a  little bit of what could be some fluid accumulation on the pump, but not quite as bad as it was before.  She also seems to be convinced that this is not blood and she says that it feels more like a jail.  However, she denies any type of redness, increased temperature, night sweats, fever, or discoloration around the implant.  I have recommended that she try applying some heat at night to see if this helps in reabsorption.  I have informed the patient that as long as there are no signs of infection, I will not worry about it too much and we need to give it some time to see if it will resolve on its own.  She understood and agreed with the plan.  Pharmacotherapy Assessment  Analgesic: Oxycodone 5 mg, 1 tab PO q 8 hrs (15 mg/day of oxycodone) + Intrathecal PF-Fentanyl(534mcg/mL)(15 MME/day) MME/day: 22.5 mg/day + 15 mg/day (IT) = 37.5 mg/day.   Monitoring: Pharmacotherapy: No side-effects or adverse reactions reported. [place] PMP: PDMP reviewed during this encounter.       Compliance: No problems identified. Effectiveness: Clinically acceptable. Plan: Refer to "POC".  UDS:  Summary  Date Value Ref Range Status  07/24/2018 FINAL  Final    Comment:    ==================================================================== TOXASSURE SELECT 13 (MW) ==================================================================== Test                             Result       Flag       Units Drug Present and Declared for Prescription Verification   Oxycodone                      754          EXPECTED   ng/mg creat   Oxymorphone                    231           EXPECTED   ng/mg creat   Noroxycodone                   2463         EXPECTED   ng/mg creat   Noroxymorphone                 89           EXPECTED   ng/mg creat    Sources of oxycodone are scheduled prescription medications.    Oxymorphone, noroxycodone, and noroxymorphone are expected    metabolites of oxycodone. Oxymorphone is also available as a    scheduled prescription medication. Drug Present not Declared for Prescription Verification   Fentanyl                       4            UNEXPECTED ng/mg creat   Norfentanyl                    29           UNEXPECTED ng/mg creat    Source of fentanyl is a scheduled prescription medication,    including IV, patch, and transmucosal formulations. Norfentanyl    is an expected metabolite of fentanyl. ==================================================================== Test                      Result    Flag  Units      Ref Range   Creatinine              134              mg/dL      >=20 ==================================================================== Declared Medications:  The flagging and interpretation on this report are based on the  following declared medications.  Unexpected results may arise from  inaccuracies in the declared medications.  **Note: The testing scope of this panel includes these medications:  Oxycodone  **Note: The testing scope of this panel does not include following  reported medications:  Aspirin (Aspirin 81)  Bupropion (Wellbutrin)  Chondroitin (Glucosamine-Chondroitin)  Cinnamon  Glipizide (Glucotrol)  Glucosamine (Glucosamine-Chondroitin)  Magnesium  Multivitamin  Naloxone  Nitroglycerin (Nitrostat)  Paroxetine  Polyethylene Glycol (GlycoLAX)  Polyethylene Glycol (MiraLAX)  Pregabalin (Lyrica)  Rosuvastatin (Crestor)  Supplement (Omega-3)  Tizanidine (Zanaflex)  Turmeric  Ubiquinone (Coenzyme Q 10)  Vitamin D2 (Drisdol)  Vitamin  E ==================================================================== For clinical consultation, please call [phone]. ====================================================================    Laboratory Chemistry Profile (12 mo)  Renal: 12/10/2018: BUN 25; Creatinine, Ser 1.04; GFR calc Af Amer >60; GFR calc non Af Amer 58  Hepatic: No results found for requested labs within last 8760 hours. Other: 05/06/2018: CRP 1; Sed Rate 20 Note: Above Lab results reviewed.  Imaging  Last 90 days:  Dg Pain Clinic C-arm 1-60 Min No Report  Result Date: 12/12/2018 Fluoro was used, but no Radiologist interpretation will be provided. Please refer to "NOTES" tab for provider progress note.  Last Hospital Admission:  No results found. Assessment  The primary encounter diagnosis was Chronic pain syndrome. Diagnoses of Failed back surgical syndrome, Epidural fibrosis, Failed cervical surgery syndrome (ACDF), Chronic knee pain (Primary Area of Pain) (Right), Chronic low back pain (Bilateral) (L>R), Presence of functional implant (Medtronic programmable intrathecal pump) (Right abdominal area), Presence of intrathecal pump (Medtronic intrathecal programmable pump) (40 mL pump), Chronic musculoskeletal pain, and Fibromyalgia were also pertinent to this visit.  Plan of Care  I have discontinued Melinda [name]'s glipiZIDE. I am also having her maintain her polyethylene glycol, aspirin, AMBULATORY NON FORMULARY MEDICATION, Fish Oil, VITAMIN E PO, glucosamine-chondroitin, multivitamin, Co Q 10, nitroGLYCERIN, NONFORMULARY OR COMPOUNDED ITEM, NONFORMULARY OR COMPOUNDED ITEM, NONFORMULARY OR COMPOUNDED ITEM, rosuvastatin, Turmeric, Misc Natural Products (TART CHERRY ADVANCED PO), Magnesium, Cinnamon, Accu-Chek Aviva Plus, naloxone, Accu-Chek Softclix Lancets, tiZANidine, pregabalin, oxyCODONE, buPROPion, Ascorbic Acid (VITAMIN C GUMMIE PO), and PARoxetine.  Pharmacotherapy (Medications Ordered): No orders of the  defined types were placed in this encounter.  Orders:  No orders of the defined types were placed in this encounter.  Follow-up plan:   Return in about 3 months (around 05/04/2019) for (VV), E/M (MM).      Interventional management options:  Considering: Palliative/therapeutic intrathecal pump management (refills and programming adjustments) Diagnostic left-sided L1 transforaminal ESI Diagnostic left-sided L2 transforaminal ESI Diagnostic right-sided L4 transforaminal ESI Diagnostic right-sided L5 transforaminal ESI Diagnostic (Midline) L1-2 LESI Diagnostic (Midline) L2-3 LESI Diagnostic (Midline) L3-4 LESI Diagnostic (Midline) L4-5 LESI Diagnostic left-sided L1-2 LESI Diagnostic left-sided L2-3 LESI Diagnostic left-sided L3-4 LESI Diagnostic right-sided L3-4 LESI Diagnostic right-sided L4-5 LESI Diagnostic caudal ESI + diagnostic epidurogram Possible Racz procedure Diagnostic bilateral lumbar facet block Possible bilateral lumbar facet RFA Diagnostic bilateral intra-articular knee joint injection with local anesthetic and steroid Diagnostic bilateral genicular nerve block Possible bilateral genicular nerve RFA Possible series of 5, bilateral, intra-articular Hyalgan knee injection Diagnosticcervicalepidural steroid injection Diagnostic bilateral cervical facet blocks Possible  bilateral cervical facet RFA   Palliative PRN treatment(s): Palliative left-sided L2-3 interlaminar LESI #2under fluoroscopic guidance and IV sedation    Recent Visits Date Type Provider Dept  11/18/18 Office Visit [name], MD [facility]  11/06/18 Procedure visit [name], MD [facility]  Showing recent visits within past 90 days and meeting all other requirements   Today's Visits Date Type Provider Dept  02/03/19 Office Visit [name], MD [facility]  Showing today's visits and meeting all other  requirements   Future Appointments Date Type Provider Dept  02/18/19 Appointment [name], MD [facility]  Showing future appointments within next 90 days and meeting all other requirements   I discussed the assessment and treatment plan with the patient. The patient was provided an opportunity to ask questions and all were answered. The patient agreed with the plan and demonstrated an understanding of the instructions.  Patient advised to call back or seek an in-person evaluation if the symptoms or condition worsens.  Total duration of non-face-to-face encounter: 15 minutes.  Note by: Gaspar Cola, MD Date: 02/03/2019; Time: 12:55 PM  Note: This dictation was prepared with Dragon dictation. Any transcriptional errors that may result from this process are unintentional.  Disclaimer:  * Given the special circumstances of the COVID-19 pandemic, the federal government has announced that the Office for Civil Rights (OCR) will exercise its enforcement discretion and will not impose penalties on physicians using telehealth in the event of noncompliance with regulatory requirements under the [place] (HIPAA) in connection with the good faith provision of telehealth during the ETKKO-46 national public health emergency. ([place])

## 2019-02-11 ENCOUNTER — Encounter: Payer: Medicare Other | Admitting: Pain Medicine

## 2019-02-13 DIAGNOSIS — Z23 Encounter for immunization: Secondary | ICD-10-CM | POA: Diagnosis not present

## 2019-02-17 ENCOUNTER — Other Ambulatory Visit: Payer: Self-pay | Admitting: Internal Medicine

## 2019-02-17 ENCOUNTER — Encounter: Payer: Self-pay | Admitting: Internal Medicine

## 2019-02-17 ENCOUNTER — Encounter: Payer: Self-pay | Admitting: Pain Medicine

## 2019-02-17 MED ORDER — BUPROPION HCL ER (XL) 150 MG PO TB24: 150.0000 mg | ORAL_TABLET | Freq: Every day | ORAL | 0 refills | Status: DC

## 2019-02-17 NOTE — Progress Notes (Addendum)
Patient's Name: Melinda [name]  MRN: [id number]  Referring Provider: Jearld Fenton, NP  DOB: 01/16/1956  PCP: Jearld Fenton, NP  DOS: 02/18/2019  Note by: Gaspar Cola, MD  Service setting: Ambulatory outpatient  Specialty: Interventional Pain Management  Patient type: Established  Location: [facility] (AMB) Pain Management Facility  Visit type: Interventional Procedure   Primary Reason for Visit: Interventional Pain Management Treatment. CC: Back Pain  Procedure:          Intrathecal Drug Delivery System (IDDS):  Type: Reservoir Refill [phone]) Today we will be making some changes in the pump as were changing the concentration. Region: Abdominal Laterality: Right  Type of Pump: Medtronic Synchromed II Delivery Route: Intrathecal Type of Pain Treated: Neuropathic/Nociceptive Primary Medication Class: Opioid/opiate  Medication, Concentration, Infusion Program, & Delivery Rate: Please see scanned programming printout.   Indications: 1. Chronic pain syndrome   2. Chronic knee pain (Primary Area of Pain) (Right)   3. Failed back surgical syndrome   4. Presence of intrathecal pump (Medtronic intrathecal programmable pump) (40 mL pump)   5. Presence of functional implant (Medtronic programmable intrathecal pump) (Right abdominal area)   6. Encounter for adjustment or management of infusion pump    Pain Assessment: Self-Reported Pain Score: 4 /10             Reported level is compatible with observation.        NOTE: The patient returns today after having had a revision of her intrathecal pump to anchor it down and also to remove some of the fluid that had accumulated around it.  There was quite a bit of blood around it initially when I aspirated it and then once she had the revision, this seems to have slowed down, but today she again has fluid accumulation in the pocket.  The patient was instructed to contact her surgeon so asked to update him on the situation.  There is no redness,  increased temperature, or discharge in the area.  There is no evidence of any kind of infection.  Before we started with the refill, I went ahead and prepped the area and aspirated the fluid out.  The fluid was clear and yellowish resembling serum.  No frank blood was removed as in the case of the first time we drained the pocket.  This was done under sterile conditions.  Pharmacotherapy Assessment  Analgesic: Oxycodone 5 mg, 1 tab PO q 8 hrs (15 mg/day of oxycodone) + Intrathecal PF-Fentanyl MME/day: 22.5 mg/day (oral).   Monitoring: Pharmacotherapy: No side-effects or adverse reactions reported. [place] PMP: PDMP reviewed during this encounter.       Compliance: No problems identified. Effectiveness: Clinically acceptable. Plan: Refer to "POC".  UDS:  Summary  Date Value Ref Range Status  07/24/2018 FINAL  Final    Comment:    ==================================================================== TOXASSURE SELECT 13 (MW) ==================================================================== Test                             Result       Flag       Units Drug Present and Declared for Prescription Verification   Oxycodone                      754          EXPECTED   ng/mg creat   Oxymorphone  231          EXPECTED   ng/mg creat   Noroxycodone                   2463         EXPECTED   ng/mg creat   Noroxymorphone                 89           EXPECTED   ng/mg creat    Sources of oxycodone are scheduled prescription medications.    Oxymorphone, noroxycodone, and noroxymorphone are expected    metabolites of oxycodone. Oxymorphone is also available as a    scheduled prescription medication. Drug Present not Declared for Prescription Verification   Fentanyl                       4            UNEXPECTED ng/mg creat   Norfentanyl                    29           UNEXPECTED ng/mg creat    Source of fentanyl is a scheduled prescription medication,    including IV, patch, and  transmucosal formulations. Norfentanyl    is an expected metabolite of fentanyl. ==================================================================== Test                      Result    Flag   Units      Ref Range   Creatinine              134              mg/dL      >=20 ==================================================================== Declared Medications:  The flagging and interpretation on this report are based on the  following declared medications.  Unexpected results may arise from  inaccuracies in the declared medications.  **Note: The testing scope of this panel includes these medications:  Oxycodone  **Note: The testing scope of this panel does not include following  reported medications:  Aspirin (Aspirin 81)  Bupropion (Wellbutrin)  Chondroitin (Glucosamine-Chondroitin)  Cinnamon  Glipizide (Glucotrol)  Glucosamine (Glucosamine-Chondroitin)  Magnesium  Multivitamin  Naloxone  Nitroglycerin (Nitrostat)  Paroxetine  Polyethylene Glycol (GlycoLAX)  Polyethylene Glycol (MiraLAX)  Pregabalin (Lyrica)  Rosuvastatin (Crestor)  Supplement (Omega-3)  Tizanidine (Zanaflex)  Turmeric  Ubiquinone (Coenzyme Q 10)  Vitamin D2 (Drisdol)  Vitamin E ==================================================================== For clinical consultation, please call [phone]. ====================================================================    Intrathecal Pump Therapy Assessment  Manufacturer: Medtronic Synchromed Type: Programmable Volume: 40 mL reservoir MRI compatibility: Yes   Drug content:  Primary Medication Class:Opioid Primary Medication:PF-Fentanyl(539mg/mL)today this is been changed to 1000 mcg/mL. Secondary Medication:PF-Bupivacaine(222mmL)this will remain at 20 mg/mL. Other Medication:PF-Baclofen (30021mmL)this will be decreased to 240 mcg/mL.  Programming:  Type: Simple continuous. See pump readout for details.   Changes:  Medication  Change: None at this point Rate Change: No change in rate  Reported side-effects or adverse reactions: None reported  Effectiveness: Described as relatively effective, allowing for increase in activities of daily living (ADL) Clinically meaningful improvement in function (CMIF): Sustained CMIF goals met  Plan: Pump refill today  Pre-op Assessment:  Ms. ColCanaday a 63 23[age]. (year old), female patient, seen today for interventional treatment. 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 Ms. Callegari has a current medication list which includes the following prescription(s): accu-chek aviva plus, accu-chek softclix lancets, AMBULATORY NON FORMULARY MEDICATION, ascorbic acid, aspirin, bupropion, cinnamon, co q 10, glucosamine-chondroitin, magnesium, misc natural products, multivitamin, naloxone, nitroglycerin, NONFORMULARY OR COMPOUNDED ITEM, NONFORMULARY OR COMPOUNDED ITEM, NONFORMULARY OR COMPOUNDED ITEM, fish oil, oxycodone, oxycodone, oxycodone, paroxetine, polyethylene glycol, pregabalin, rosuvastatin, tizanidine, turmeric, vitamin e, and glipizide. Her primarily concern today is the Back Pain  Initial Vital Signs:  Pulse/HCG Rate: 69  Temp: 98.8 F (37.1 C) Resp:   BP: 136/82 SpO2: 97 %  BMI:  Estimated body mass index is 30.99 kg/m as calculated from the following:   Height as of this encounter: _0  (1.778 m).   Weight as of this encounter: 216 lb (98 kg).  Risk Assessment: Allergies: Reviewed. She has No Known Allergies.  Allergy Precautions: None required Coagulopathies: Reviewed. None identified.  Blood-thinner therapy: None at this time Active Infection(s): Reviewed. None identified. Ms. Schnitzer is afebrile  Site Confirmation: Ms. Offner was asked to confirm the procedure and laterality before marking the site Procedure checklist: Completed Consent: Before the procedure and under the influence of no sedative(s), amnesic(s), or anxiolytics, the patient was informed of the treatment options, risks and possible complications. To fulfill our ethical and legal obligations, as recommended by the American Medical Association's Code of Ethics, I have informed the patient of my clinical impression; the nature and purpose of the treatment or procedure; the risks, benefits, and possible complications of the intervention; the alternatives, including doing nothing; the risk(s) and benefit(s) of the alternative treatment(s) or procedure(s); and the risk(s) and benefit(s) of doing nothing.  Ms. Hightower was provided with information about the general risks and possible complications associated with most interventional procedures. These include, but are not limited to: failure to achieve desired goals, infection, bleeding, organ or nerve damage, allergic reactions, paralysis, and/or death.  In addition, she was informed of those risks and possible complications associated to this particular procedure, which include, but are not limited to: damage to the implant; failure to decrease pain; local, systemic, or serious CNS infections, intraspinal abscess with possible cord compression and paralysis, or life-threatening such as meningitis; bleeding; organ damage; nerve injury or damage with  subsequent sensory, motor, and/or autonomic system dysfunction, resulting in transient or permanent pain, numbness, and/or weakness of one or several areas of the body; allergic reactions, either minor or major life-threatening, such as anaphylactic or anaphylactoid reactions.  Furthermore, Ms. Faires was informed of those risks and complications associated with the medications. These include, but are not limited to: allergic reactions (i.e.: anaphylactic or anaphylactoid reactions); endorphine suppression; bradycardia and/or hypotension; water retention and/or peripheral vascular relaxation leading to lower extremity edema and possible stasis ulcers; respiratory depression and/or shortness of breath; decreased metabolic rate leading to weight gain; swelling or edema; medication-induced neural toxicity; particulate matter embolism and blood vessel occlusion with resultant organ, and/or nervous system infarction; and/or intrathecal granuloma formation with possible spinal cord compression and permanent paralysis.  Before refilling the pump Ms. Larcom was informed that some of the  medications used in the devise may not be FDA approved for such use and therefore it constitutes an off-label use of the medications.  Finally, she was informed that Medicine is not an exact science; therefore, there is also the possibility of unforeseen or unpredictable risks and/or possible complications that may result in a catastrophic outcome. The patient indicated having understood very clearly. We have given the patient no guarantees and we have made no promises. Enough time was given to the patient to ask questions, all of which were answered to the patient's satisfaction. Ms. Hendel has indicated that she wanted to continue with the procedure. Attestation: I, the ordering provider, attest that I have discussed with the patient the benefits, risks, side-effects, alternatives, likelihood of achieving goals, and potential  problems during recovery for the procedure that I have provided informed consent. Date  Time: 02/18/2019 12:47 PM  Pre-Procedure Preparation:  Monitoring: As per clinic protocol. Respiration, ETCO2, SpO2, BP, heart rate and rhythm monitor placed and checked for adequate function Safety Precautions: Patient was assessed for positional comfort and pressure points before starting the procedure. Time-out: I initiated and conducted the "Time-out" before starting the procedure, as per protocol. The patient was asked to participate by confirming the accuracy of the "Time Out" information. Verification of the correct person, site, and procedure were performed and confirmed by me, the nursing staff, and the patient. "Time-out" conducted as per Joint Commission's Universal Protocol (UP.01.01.01). Time: 1300  Description of Procedure:          Position: Supine Target Area: Central-port of intrathecal pump. Approach: Anterior, 90 degree angle approach. Area Prepped: Entire Area around the pump implant. Prepping solution: DuraPrep (Iodine Povacrylex [0.7% available iodine] and Isopropyl Alcohol, 74% w/w) Safety Precautions: Aspiration looking for blood return was conducted prior to all injections. At no point did we inject any substances, as a needle was being advanced. No attempts were made at seeking any paresthesias. Safe injection practices and needle disposal techniques used. Medications properly checked for expiration dates. SDV (single dose vial) medications used. Description of the Procedure: Protocol guidelines were followed. Two nurses trained to do implant refills were present during the entire procedure. The refill medication was checked by both healthcare providers as well as the patient. The patient was included in the "Time-out" to verify the medication. The patient was placed in position. The pump was identified. The area was prepped in the usual manner. The sterile template was positioned over  the pump, making sure the side-port location matched that of the pump. Both, the pump and the template were held for stability. The needle provided in the Medtronic Kit was then introduced thru the center of the template and into the central port. The pump content was aspirated and discarded volume documented. The new medication was slowly infused into the pump, thru the filter, making sure to avoid overpressure of the device. The needle was then removed and the area cleansed, making sure to leave some of the prepping solution back to take advantage of its long term bactericidal properties. The pump was interrogated and programmed to reflect the correct medication, volume, and dosage. The program was printed and taken to the physician for approval. Once checked and signed by the physician, a copy was provided to the patient and another scanned into the EMR. Vitals:   02/18/19 1243  BP: 136/82  Pulse: 69  Temp: 98.8 F (37.1 C)  SpO2: 97%  Weight: 216 lb (98 kg)  Height: _0  (1.778 m)  Start Time:   hrs. End Time:   hrs. Materials & Medications: Medtronic Refill Kit Medication(s): Please see chart orders for details.  Imaging Guidance:          Type of Imaging Technique: None used Indication(s): N/A Exposure Time: No patient exposure Contrast: None used. Fluoroscopic Guidance: N/A Ultrasound Guidance: N/A Interpretation: N/A  Antibiotic Prophylaxis:   Anti-infectives (From admission, onward)   None     Indication(s): None identified  Post-operative Assessment:  Post-procedure Vital Signs:  Pulse/HCG Rate: 69  Temp: 98.8 F (37.1 C) Resp:   BP: 136/82 SpO2: 97 %  EBL: None  Complications: No immediate post-treatment complications observed by team, or reported by patient.  Note: The patient tolerated the entire procedure well. A repeat set of vitals were taken after the procedure and the patient was kept under observation following institutional policy, for this type  of procedure. Post-procedural neurological assessment was performed, showing return to baseline, prior to discharge. The patient was provided with post-procedure discharge instructions, including a section on how to identify potential problems. Should any problems arise concerning this procedure, the patient was given instructions to immediately contact us, at any time, without hesitation. In any case, we plan to contact the patient by telephone for a follow-up status report regarding this interventional procedure.  Comments:  No additional relevant information.  Plan of Care  Orders:  Orders Placed This Encounter  Procedures  . PUMP REFILL    Maintain Protocol by having two(2) healthcare providers during procedure and programming.    Scheduling Instructions:     Please refill intrathecal pump today.    Order Specific Question:   Where will this procedure be performed?    Answer:   [facility]  . PUMP REFILL    Whenever possible schedule on a procedure today.    Standing Status:   Future    Standing Expiration Date:   07/18/2019    Scheduling Instructions:     Please schedule intrathecal pump refill based on pump programming. Avoid schedule intervals of more than 120 days (4 months).    Order Specific Question:   Where will this procedure be performed?    Answer:   [facility]  . Informed Consent Details: Transcribe to consent form and obtain patient signature    Consent Attestation: I, the ordering provider, attest that I have discussed with the patient the benefits, risks, side-effects, alternatives, likelihood of achieving goals, and potential problems during recovery for the procedure that I have provided informed consent.    Scheduling Instructions:     Procedure: Intrathecal Pump Refill     Attending Physician: Beatriz Chancellor A. Dossie Arbour, MD     Indications: Chronic Pain Syndrome (G89.4)   Chronic Opioid Analgesic:   Oxycodone 5 mg, 1 tab PO q 8 hrs (15 mg/day of  oxycodone) + Intrathecal PF-Fentanyl MME/day: 22.5 mg/day (oral).    Medications ordered for procedure: No orders of the defined types were placed in this encounter.  Medications administered: Lubertha South had no medications administered during this visit.  See the medical record for exact dosing, route, and time of administration.  Follow-up plan:   Return for Pump Refill (Max:60mo.       Interventional management options:  Considering: Palliative/therapeutic intrathecal pump management (refills and programming adjustments) Diagnostic left-sided L1 transforaminal ESI Diagnostic left-sided L2 transforaminal ESI Diagnostic right-sided L4 transforaminal ESI Diagnostic right-sided L5 transforaminal ESI Diagnostic (Midline) L1-2 LESI Diagnostic (Midline) L2-3 LESI Diagnostic (Midline) L3-4 LESI Diagnostic (Midline)  L4-5 LESI Diagnostic left-sided L1-2 LESI Diagnostic left-sided L2-3 LESI Diagnostic left-sided L3-4 LESI Diagnostic right-sided L3-4 LESI Diagnostic right-sided L4-5 LESI Diagnostic caudal ESI + diagnostic epidurogram Possible Racz procedure Diagnostic bilateral lumbar facet block Possible bilateral lumbar facet RFA Diagnostic bilateral intra-articular knee joint injection with local anesthetic and steroid Diagnostic bilateral genicular nerve block Possible bilateral genicular nerve RFA Possible series of 5, bilateral, intra-articular Hyalgan knee injection Diagnosticcervicalepidural steroid injection Diagnostic bilateral cervical facet blocks Possible bilateral cervical facet RFA   Palliative PRN treatment(s): Palliative left-sided L2-3 interlaminar LESI #2     Recent Visits Date Type Provider Dept  02/03/19 Office Visit [name], MD [facility]  Showing recent visits within past 90 days and meeting all other requirements   Today's Visits Date Type Provider Dept  02/18/19 Procedure visit [name], MD [facility]  Showing today's visits and meeting all other requirements   Future Appointments No visits were found meeting these conditions.  Showing future appointments within next 90 days and meeting all other requirements   Disposition: Discharge home  Discharge Date & Time: 02/18/2019; 1432 hrs.   Primary Care Physician: Jearld Fenton, NP Location: [facility] Note by: Gaspar Cola, MD Date: 02/18/2019; Time: 2:42 PM  Disclaimer:  Medicine is not an Chief Strategy Officer. The only guarantee in medicine is that nothing is guaranteed. It is important to note that the decision to proceed with this intervention was based on the information collected from the patient. The Data and conclusions were drawn from the patient's questionnaire, the interview, and the physical examination. Because the information was provided in large part by the patient, it cannot be guaranteed that it has not been purposely or unconsciously manipulated. Every effort has been made to obtain as much relevant data as possible for this evaluation. It is important to note that the conclusions that lead to this procedure are derived in large part from the available data. Always take into account that the treatment will also be dependent on availability of resources and existing treatment guidelines, considered by other Pain Management Practitioners as being common knowledge and practice, at the time of the intervention. For Medico-Legal purposes, it is also important to point out that variation in procedural techniques and pharmacological choices are the acceptable norm. The indications, contraindications, technique, and results of the above procedure should only be interpreted and judged by a Board-Certified Interventional Pain Specialist with extensive familiarity and expertise in the same exact procedure and technique.

## 2019-02-18 ENCOUNTER — Other Ambulatory Visit: Payer: Self-pay

## 2019-02-18 ENCOUNTER — Encounter: Payer: Self-pay | Admitting: Pain Medicine

## 2019-02-18 ENCOUNTER — Other Ambulatory Visit: Payer: Self-pay | Admitting: Internal Medicine

## 2019-02-18 ENCOUNTER — Ambulatory Visit: Payer: Medicare Other | Attending: Pain Medicine | Admitting: Pain Medicine

## 2019-02-18 VITALS — BP 136/82 | HR 69 | Temp 98.8°F | Ht 70.0 in | Wt 216.0 lb

## 2019-02-18 DIAGNOSIS — Z451 Encounter for adjustment and management of infusion pump: Secondary | ICD-10-CM | POA: Insufficient documentation

## 2019-02-18 DIAGNOSIS — M25561 Pain in right knee: Secondary | ICD-10-CM | POA: Insufficient documentation

## 2019-02-18 DIAGNOSIS — M961 Postlaminectomy syndrome, not elsewhere classified: Secondary | ICD-10-CM

## 2019-02-18 DIAGNOSIS — Z978 Presence of other specified devices: Secondary | ICD-10-CM | POA: Insufficient documentation

## 2019-02-18 DIAGNOSIS — G894 Chronic pain syndrome: Secondary | ICD-10-CM | POA: Diagnosis not present

## 2019-02-18 DIAGNOSIS — G8929 Other chronic pain: Secondary | ICD-10-CM | POA: Insufficient documentation

## 2019-02-18 DIAGNOSIS — Z969 Presence of functional implant, unspecified: Secondary | ICD-10-CM

## 2019-02-18 MED ORDER — LIDOCAINE HCL 2 % IJ SOLN
INTRAMUSCULAR | Status: AC
  Filled 2019-02-18: qty 20

## 2019-02-18 MED ORDER — LIDOCAINE HCL (PF) 1 % IJ SOLN
INTRAMUSCULAR | Status: AC
  Filled 2019-02-18: qty 5

## 2019-02-18 MED ORDER — GLIPIZIDE ER 2.5 MG PO TB24: ORAL_TABLET | ORAL | 0 refills | Status: DC

## 2019-02-18 NOTE — Addendum Note (Signed)
Addended by: Jearld Fenton on: 02/18/2019 02:30 PM   Modules accepted: Orders

## 2019-02-23 MED FILL — Medication: INTRATHECAL | Qty: 1 | Status: AC

## 2019-03-05 ENCOUNTER — Other Ambulatory Visit: Payer: Self-pay | Admitting: Cardiology

## 2019-03-05 MED ORDER — NITROGLYCERIN 0.4 MG SL SUBL: 0.4000 mg | SUBLINGUAL_TABLET | SUBLINGUAL | 1 refills | Status: DC | PRN

## 2019-03-12 ENCOUNTER — Other Ambulatory Visit: Payer: Self-pay

## 2019-03-12 ENCOUNTER — Ambulatory Visit (INDEPENDENT_AMBULATORY_CARE_PROVIDER_SITE_OTHER): Payer: Medicare Other | Admitting: Internal Medicine

## 2019-03-12 ENCOUNTER — Encounter: Payer: Self-pay | Admitting: Internal Medicine

## 2019-03-12 VITALS — BP 122/76 | HR 67 | Temp 97.7°F | Ht 69.0 in | Wt 226.0 lb

## 2019-03-12 DIAGNOSIS — F329 Major depressive disorder, single episode, unspecified: Secondary | ICD-10-CM | POA: Diagnosis not present

## 2019-03-12 DIAGNOSIS — R109 Unspecified abdominal pain: Secondary | ICD-10-CM | POA: Diagnosis not present

## 2019-03-12 DIAGNOSIS — F419 Anxiety disorder, unspecified: Secondary | ICD-10-CM | POA: Diagnosis not present

## 2019-03-12 DIAGNOSIS — N183 Chronic kidney disease, stage 3 unspecified: Secondary | ICD-10-CM

## 2019-03-12 DIAGNOSIS — E559 Vitamin D deficiency, unspecified: Secondary | ICD-10-CM

## 2019-03-12 DIAGNOSIS — Z0000 Encounter for general adult medical examination without abnormal findings: Secondary | ICD-10-CM

## 2019-03-12 DIAGNOSIS — R8290 Unspecified abnormal findings in urine: Secondary | ICD-10-CM | POA: Diagnosis not present

## 2019-03-12 DIAGNOSIS — M797 Fibromyalgia: Secondary | ICD-10-CM

## 2019-03-12 DIAGNOSIS — G894 Chronic pain syndrome: Secondary | ICD-10-CM | POA: Diagnosis not present

## 2019-03-12 DIAGNOSIS — E7800 Pure hypercholesterolemia, unspecified: Secondary | ICD-10-CM

## 2019-03-12 DIAGNOSIS — R202 Paresthesia of skin: Secondary | ICD-10-CM | POA: Diagnosis not present

## 2019-03-12 DIAGNOSIS — M5136 Other intervertebral disc degeneration, lumbar region: Secondary | ICD-10-CM | POA: Diagnosis not present

## 2019-03-12 DIAGNOSIS — E119 Type 2 diabetes mellitus without complications: Secondary | ICD-10-CM

## 2019-03-12 DIAGNOSIS — I2510 Atherosclerotic heart disease of native coronary artery without angina pectoris: Secondary | ICD-10-CM

## 2019-03-12 DIAGNOSIS — I2583 Coronary atherosclerosis due to lipid rich plaque: Secondary | ICD-10-CM

## 2019-03-12 DIAGNOSIS — G4733 Obstructive sleep apnea (adult) (pediatric): Secondary | ICD-10-CM

## 2019-03-12 DIAGNOSIS — F32A Depression, unspecified: Secondary | ICD-10-CM

## 2019-03-12 LAB — CBC
HCT: 37.5 % (ref 36.0–46.0)
Hemoglobin: 12.5 g/dL (ref 12.0–15.0)
MCHC: 33.3 g/dL (ref 30.0–36.0)
MCV: 86.7 fl (ref 78.0–100.0)
Platelets: 189 10*3/uL (ref 150.0–400.0)
RBC: 4.32 Mil/uL (ref 3.87–5.11)
RDW: 12.8 % (ref 11.5–15.5)
WBC: 4.1 10*3/uL (ref 4.0–10.5)

## 2019-03-12 LAB — COMPREHENSIVE METABOLIC PANEL
ALT: 18 U/L (ref 0–35)
AST: 20 U/L (ref 0–37)
Albumin: 4.3 g/dL (ref 3.5–5.2)
Alkaline Phosphatase: 99 U/L (ref 39–117)
BUN: 24 mg/dL — ABNORMAL HIGH (ref 6–23)
CO2: 33 mEq/L — ABNORMAL HIGH (ref 19–32)
Calcium: 9 mg/dL (ref 8.4–10.5)
Chloride: 104 mEq/L (ref 96–112)
Creatinine, Ser: 1.08 mg/dL (ref 0.40–1.20)
GFR: 51.49 mL/min — ABNORMAL LOW (ref >60.00)
Glucose, Bld: 96 mg/dL (ref 70–99)
Potassium: 4.4 mEq/L (ref 3.5–5.1)
Sodium: 142 mEq/L (ref 135–145)
Total Bilirubin: 0.4 mg/dL (ref 0.2–1.2)
Total Protein: 6.9 g/dL (ref 6.0–8.3)

## 2019-03-12 LAB — POC URINALSYSI DIPSTICK (AUTOMATED)
Bilirubin, UA: NEGATIVE
Blood, UA: NEGATIVE
Glucose, UA: NEGATIVE
Ketones, UA: NEGATIVE
Leukocytes, UA: NEGATIVE
Nitrite, UA: NEGATIVE
Protein, UA: POSITIVE — AB
Spec Grav, UA: 1.02 (ref 1.010–1.025)
Urobilinogen, UA: 0.2 E.U./dL
pH, UA: 6 (ref 5.0–8.0)

## 2019-03-12 LAB — LIPID PANEL
Cholesterol: 102 mg/dL (ref 0–200)
HDL: 34.3 mg/dL — ABNORMAL LOW (ref >39.00)
LDL Cholesterol: 36 mg/dL (ref 0–99)
NonHDL: 67.72
Total CHOL/HDL Ratio: 3
Triglycerides: 158 mg/dL — ABNORMAL HIGH (ref 0.0–149.0)
VLDL: 31.6 mg/dL (ref 0.0–40.0)

## 2019-03-12 LAB — MICROALBUMIN / CREATININE URINE RATIO
Creatinine,U: 131 mg/dL
Microalb Creat Ratio: 2.5 mg/g (ref 0.0–30.0)
Microalb, Ur: 3.3 mg/dL — ABNORMAL HIGH (ref 0.0–1.9)

## 2019-03-12 LAB — TSH: TSH: 1.46 u[IU]/mL (ref 0.35–4.50)

## 2019-03-12 LAB — HEMOGLOBIN A1C: Hgb A1c MFr Bld: 6 % (ref 4.6–6.5)

## 2019-03-12 LAB — VITAMIN D 25 HYDROXY (VIT D DEFICIENCY, FRACTURES): VITD: 34.99 ng/mL (ref 30.00–100.00)

## 2019-03-12 LAB — VITAMIN B12: Vitamin B-12: 253 pg/mL (ref 211–911)

## 2019-03-12 MED ORDER — ZOSTER VAC RECOMB ADJUVANTED 50 MCG/0.5ML IM SUSR: 0.5000 mL | Freq: Once | INTRAMUSCULAR | 1 refills | Status: AC

## 2019-03-12 MED ORDER — HYDROCHLOROTHIAZIDE 12.5 MG PO CAPS: 12.5000 mg | ORAL_CAPSULE | Freq: Every day | ORAL | 2 refills | Status: DC

## 2019-03-12 NOTE — Progress Notes (Addendum)
HPI:  Pt presents to the today for her subsequent annual Medicare Wellness Exam. She is also due for follow up of chronic conditions.  OSA: She averages 5-6 hours of sleep per night with the use of her CPAP. She does not feel rested when she wakes up. She does report daytime fatigue. Sleep study from 03/2019 reviewed.  DM 2 with Neuropathy: Her last A1C was 6%, 12/2018. Her sugars are usually less than 150. She denies hypoglycemia. She is taking Glipizide and Lyrica as prescribed. She is on Lisinopril for renal protection.  Her last eye exam was within the last year. She checks her feet routinely.  CKD 3: Her last GFR was 58, 12/2018. She is not on an ACEI/ARB. Urine microalbumin from reviewed.  CAD with HLD: Her last LDL was 63, 10/2017. She denies recent chest pain. She is taking Fish Oil, CoQ10 and Rosuvastatin. She does try to consume a low fat diet.  Chronic Pain, Fibromyalgia: She has an intrathecal pump, that has a pocket of fluid around it. She takes Oxycodone and Zanaflex as needed for pain.   Depression: Chronic but stable on Paxil and Wellbutrin. She denies anxiety, SI/HI.  Past Medical History:  Diagnosis Date  . 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 Amputation of second toe, right, traumatic ([place]) 09/2017  . Anemia    years ago  . Anxiety   . Blue toes    2nd toe on right foot, will get appt.  . Bulging disc   . CAD (coronary artery disease) 2009   s/p stent to LAD  . 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  Ablations done.  Now has rare episodes  . Chicken pox   . Chronic fatigue   . Chronic kidney disease    "problem with kidney filtration"  . Coronary arteritis   . Degenerative disc disease   . Depression   . Diabetes mellitus without complication ([place])   . Facet joint disease   . Fibromyalgia   . Heart disease   . Hyperlipidemia   . IBS (irritable bowel syndrome)   . MCL deficiency, knee   . MRSA (methicillin  resistant staph aureus) culture positive 2011   GREAT TOE RIGHT FOOT  . Neuropathy 04/18/2010  . Peripheral neuropathy   . Restless leg syndrome   . Sepsis ([place]) 09/2013  . Sleep apnea 08/17/2003   uses CPAP, sleep study at [facility] (mild to moderate)  . Spinal stenosis     Current Outpatient Medications  Medication Sig Dispense Refill  . ACCU-CHEK AVIVA PLUS test strip TEST 1 TIME DAILY **E11.9** 200 each 2  . ACCU-CHEK SOFTCLIX LANCETS lancets USE TO CHECK BLOOD SUGAR 1 TIME DAILY. DX: E11.42 100 each 2  . AMBULATORY NON FORMULARY MEDICATION Medication Name: CPAP MASK OF CHOICE FOR HOME DEVICE 1 each 0  . Ascorbic Acid (VITAMIN C GUMMIE PO) Take 2 tablets by mouth daily.    Marland Kitchen aspirin 81 MG tablet Take 81 mg by mouth daily.    Marland Kitchen buPROPion (WELLBUTRIN XL) 150 MG 24 hr tablet Take 1 tablet (150 mg total) by mouth daily. 90 tablet 0  . Cinnamon 500 MG TABS Take 1,000 mg by mouth daily.    . Coenzyme Q10 (CO Q 10) 100 MG CAPS Take 100 mg by mouth daily.     Marland Kitchen glipiZIDE (GLUCOTROL XL) 2.5 MG 24 hr tablet TAKE 1 TABLET BY MOUTH DAILY WITH BREAKFAST. 90 tablet 0  .  glucosamine-chondroitin 500-400 MG tablet Take 2 tablets by mouth daily.     . Magnesium 250 MG TABS Take 250 mg by mouth daily.    . Misc Natural Products (TART CHERRY ADVANCED PO) Take 1,200 mg by mouth at bedtime.     . Multiple Vitamin (MULTIVITAMIN) tablet Take 1 tablet by mouth daily.    . naloxone (NARCAN) 2 MG/2ML injection Inject 1 mL (1 mg total) into the muscle as needed for up to 2 doses (for opioid overdose). Inject content of syringe into thigh muscle. Call 911. 2 Syringe 1  . nitroGLYCERIN (NITROSTAT) 0.4 MG SL tablet Place 1 tablet (0.4 mg total) under the tongue every 5 (five) minutes as needed for chest pain. Please keep upcoming appt in October. Thank you 25 tablet 1  . NONFORMULARY OR COMPOUNDED ITEM 147.95 mcg by Epidural Infusion route Continuous EPIDURAL. Medtronic Neuromodulation pump Fentanyl 147.95  mcg, Baclofen, bupivicaine    . NONFORMULARY OR COMPOUNDED ITEM 88.77 mcg by Epidural Infusion route Continuous EPIDURAL. Medtronic Neuromodulation pump: Fentanyl, baclofen 88.77, bupivicane    . NONFORMULARY OR COMPOUNDED ITEM 5.918 mg by Epidural Infusion route Continuous EPIDURAL. Medtronic Neuromodulation pump: Fentanyl, Baclofen, Bupivicaine 5.918 mg    . Omega-3 Fatty Acids (FISH OIL) 1000 MG CAPS Take 2,000 mg by mouth 2 (two) times daily.     Marland Kitchen oxyCODONE (OXY IR/ROXICODONE) 5 MG immediate release tablet Take 1 tablet (5 mg total) by mouth every 8 (eight) hours as needed for severe pain. Must last 30 days. 90 tablet 0  . [START ON 04/04/2019] oxyCODONE (OXY IR/ROXICODONE) 5 MG immediate release tablet Take 1 tablet (5 mg total) by mouth every 8 (eight) hours as needed for severe pain. Must last 30 days. 90 tablet 0  . PARoxetine (PAXIL) 20 MG tablet TAKE ONE-HALF OF A TABLET BY MOUTH DAILY IN THE MORNING 90 tablet 0  . polyethylene glycol (MIRALAX / GLYCOLAX) packet Take 17 g by mouth every other day.     . pregabalin (LYRICA) 150 MG capsule Take 1 capsule (150 mg total) by mouth 3 (three) times daily. 270 capsule 0  . rosuvastatin (CRESTOR) 20 MG tablet Take 1 tablet (20 mg total) by mouth daily. 30 tablet 11  . tiZANidine (ZANAFLEX) 4 MG tablet Take 1 tablet (4 mg total) by mouth 3 (three) times daily. 270 tablet 0  . Turmeric 500 MG TABS Take 500 mg by mouth 2 (two) times daily.     Marland Kitchen VITAMIN E PO Take 450 Units by mouth daily.     Marland Kitchen oxyCODONE (OXY IR/ROXICODONE) 5 MG immediate release tablet Take 1 tablet (5 mg total) by mouth every 8 (eight) hours as needed for severe pain. Must last 30 days. 90 tablet 0   No current facility-administered medications for this visit.     No Known Allergies  Family History  Problem Relation Age of Onset  . Lung cancer Mother   . Diabetes Father   . Heart disease Maternal Grandfather   . Diabetes Paternal Grandmother   . Colon cancer Paternal  Grandfather   . Stroke Neg Hx     Social History   Socioeconomic History  . Marital status: Significant Other    Spouse name: Not on file  . Number of children: Not on file  . Years of education: Not on file  . Highest education level: Not on file  Occupational History  . Not on file  Social Needs  . Financial resource strain: Not on file  .  Food insecurity    Worry: Not on file    Inability: Not on file  . Transportation needs    Medical: Not on file    Non-medical: Not on file  Tobacco Use  . Smoking status: Former Smoker    Packs/day: 1.50    Years: 32.00    Pack years: 48.00    Types: Cigarettes    Quit date: 07/22/2007    Years since quitting: 11.6  . Smokeless tobacco: Never Used  Substance and Sexual Activity  . Alcohol use: Yes    Alcohol/week: 0.0 standard drinks    Comment: occ - Holidays  . Drug use: No  . Sexual activity: Yes  Lifestyle  . Physical activity    Days per week: Not on file    Minutes per session: Not on file  . Stress: Not on file  Relationships  . Social Herbalist on phone: Not on file    Gets together: Not on file    Attends religious service: Not on file    Active member of club or organization: Not on file    Attends meetings of clubs or organizations: Not on file    Relationship status: Not on file  . Intimate partner violence    Fear of current or ex partner: Not on file    Emotionally abused: Not on file    Physically abused: Not on file    Forced sexual activity: Not on file  Other Topics Concern  . Not on file  Social History Narrative   Lives at home with a partner. Independent at baseline    Hospitiliaztions: None  Health Maintenance:    Flu: 01/2019             Tetanus: 08/2007             Pneumovax: 07/2015             Zostavax: never  Shingrix: never             Mammogram: 08/2017             Pap Smear: 08/2017             Colon Screening: 2011             Eye Doctor: annually             Dental  Exam: as needed              Providers:              PCP: [name], NP             Pain Management: Dr. Daleen Squibb.             Cardiologist: Dr. Marlou Porch   I have personally reviewed and have noted:  1. The patient's medical and social history 2. Their use of alcohol, tobacco or illicit drugs 3. Their current medications and supplements 4. The patient's functional ability including ADL's, fall risks, home safety risks and hearing or visual impairment. 5. Diet and physical activities 6. Evidence for depression or mood disorder  Subjective:   Review of Systems:   Constitutional: Pt reports fatigue. Denies fever, malaise, headache or abrupt weight changes.  HEENT: Denies eye pain, eye redness, ear pain, ringing in the ears, wax buildup, runny nose, nasal congestion, bloody nose, or sore throat. Respiratory: Denies difficulty breathing, shortness of breath, cough or sputum production.   Cardiovascular: Denies chest pain, chest tightness, palpitations or  swelling in the hands or feet.  Gastrointestinal: Pt reports fluid buildup around intrathecal pump. Denies abdominal pain, bloating, constipation, diarrhea or blood in the stool.  GU: Pt reports urinary odor. Denies urgency, frequency, pain with urination, burning sensation, blood in urine, or discharge. Musculoskeletal: Pt reports left lower leg swelling. Denies decrease in range of motion, difficulty with gait, muscle pain or joint pain.  Skin: Denies redness, rashes, lesions or ulcercations.  Neurological: Pt reports neuropathic pain in feet. Denies dizziness, difficulty with memory, difficulty with speech or problems with balance and coordination.  Psych: Pt has a history of depression. Denies anxiety, SI/HI.  No other specific complaints in a complete review of systems (except as listed in HPI above).  Objective:  PE:   BP 122/76   Pulse 67   Temp 97.7 F (36.5 C) (Temporal)   Ht 5\' 9"  (1.753 m)   Wt 226 lb (102.5 kg)    LMP  (LMP Unknown)   SpO2 97%   BMI 33.37 kg/m  Wt Readings from Last 3 Encounters:  03/12/19 226 lb (102.5 kg)  02/18/19 216 lb (98 kg)  12/12/18 214 lb (97.1 kg)    General: Appears her stated age, obese, in NAD. Skin: Warm, dry and intact. Thinning hair noted. No lower extremity ulcerations noted. HEENT: Head: normal shape and size; Eyes: sclera white, no icterus, conjunctiva pink, PERRLA and EOMs intact; Ears: Tm's gray and intact, normal light reflex; Neck: Neck supple, trachea midline. No masses, lumps or thyromegaly present.  Cardiovascular: Normal rate and rhythm. S1,S2 noted.  No murmur, rubs or gallops noted. No JVD or BLE edema. No carotid bruits noted. Pulmonary/Chest: Normal effort and positive vesicular breath sounds. No respiratory distress. No wheezes, rales or ronchi noted.  Abdomen: Soft and nontender. Normal bowel sounds. No distention or masses noted. Liver, spleen and kidneys non palpable. Musculoskeletal: Strength 5/5 BUE/BLE. No difficulty with gait.  Neurological: Alert and oriented. Cranial nerves II-XII grossly intact. Coordination normal.  Psychiatric: Mood and affect mildly flat. Behavior is normal. Judgment and thought content normal.     BMET    Component Value Date/Time   NA 143 12/10/2018 1330   NA 135 (L) 06/30/2013 1203   K 3.9 12/10/2018 1330   K 4.1 06/30/2013 1203   CL 105 12/10/2018 1330   CL 102 06/30/2013 1203   CO2 29 12/10/2018 1330   CO2 32 06/30/2013 1203   GLUCOSE 59 (L) 12/10/2018 1330   GLUCOSE 143 (H) 06/30/2013 1203   BUN 25 (H) 12/10/2018 1330   BUN 19 (H) 06/30/2013 1203   CREATININE 1.04 (H) 12/10/2018 1330   CREATININE 0.89 06/30/2013 1203   CALCIUM 9.6 12/10/2018 1330   CALCIUM 9.7 06/30/2013 1203   GFRNONAA 58 (L) 12/10/2018 1330   GFRNONAA >60 06/30/2013 1203   GFRAA >60 12/10/2018 1330   GFRAA >60 06/30/2013 1203    Lipid Panel     Component Value Date/Time   CHOL 130 11/04/2017 1326   TRIG 285.0 (H)  11/04/2017 1326   HDL 37.50 (L) 11/04/2017 1326   CHOLHDL 3 11/04/2017 1326   VLDL 57.0 (H) 11/04/2017 1326   LDLCALC 62 09/11/2015 0421    CBC    Component Value Date/Time   WBC 6.3 12/10/2018 1330   RBC 4.85 12/10/2018 1330   HGB 13.8 12/10/2018 1330   HGB 14.0 11/12/2011 1136   HCT 42.4 12/10/2018 1330   HCT 40.7 11/12/2011 1136   PLT 204 12/10/2018 1330   PLT 274  11/12/2011 1136   MCV 87.4 12/10/2018 1330   MCV 86 11/12/2011 1136   MCH 28.5 12/10/2018 1330   MCHC 32.5 12/10/2018 1330   RDW 12.5 12/10/2018 1330   RDW 12.8 11/12/2011 1136   LYMPHSABS 1.8 08/14/2017 1950   LYMPHSABS 2.5 11/12/2011 1136   MONOABS 0.6 08/14/2017 1950   MONOABS 0.8 11/12/2011 1136   EOSABS 0.3 08/14/2017 1950   EOSABS 0.6 11/12/2011 1136   BASOSABS 0.0 08/14/2017 1950   BASOSABS 0.1 11/12/2011 1136    Hgb A1C Lab Results  Component Value Date   HGBA1C 6.0 (H) 12/10/2018      Assessment and Plan:   Medicare Annual Wellness Visit:  Diet: She does eat meat. She consumes fruits and veggies daily. She tries to avoid fried foods. She drinks mostly water, coffee. Physical activity: Sedentary Depression/mood screen: Chronic, stable on meds. Hearing: Intact to whispered voice Visual acuity: Grossly normal, performs annual eye exam  ADLs: Capable Fall risk: None Home safety: Good Cognitive evaluation: Intact to orientation, naming, recall and repetition EOL planning: No adv directives, full code/ I agree  Preventative Medicine: Flu, pneumovax UTD. She declines tetanus for financial reasons. She will consider Shingrix. She will call to schedule her mammaogram. She declines pap smear or bone density. Colon screening UTD. Encouraged her to consume a balaced diet and exercise regimen. Advised her to see an eye doctor and dentist annually. Will check CBC, CMET, Lipid, TSH,  A1C, urine microalbumin, Vit D and B12.  Urine Odor:  Urinalysis normal Push fluids Will monitor  Peripheral  Edema:  HCTZ 12.5 mg daily Encouraged low salt diet Encouraged elevation  Paresthesia:  TSH, B12 and Vit D today  Next appointment: 6 months, follow up chronic conditions.   [name], NP

## 2019-03-13 ENCOUNTER — Encounter: Payer: Self-pay | Admitting: Internal Medicine

## 2019-03-13 ENCOUNTER — Telehealth: Payer: Self-pay | Admitting: Internal Medicine

## 2019-03-13 DIAGNOSIS — G4733 Obstructive sleep apnea (adult) (pediatric): Secondary | ICD-10-CM

## 2019-03-13 MED ORDER — LISINOPRIL 5 MG PO TABS: 5.0000 mg | ORAL_TABLET | Freq: Every day | ORAL | 3 refills | Status: DC

## 2019-03-13 NOTE — Telephone Encounter (Signed)
Called and spoke to, who stated that she would like to try a different mask. Pt currently using nasal mask. She feels that there is a gap in her mask, and she tends to sleep with her mouth open even with the cpap.This has occurred with the past month. Pt is questioning if another sleep study should be performed, as it has been so long since her last.   DR please advise. Thanks

## 2019-03-15 ENCOUNTER — Encounter: Payer: Self-pay | Admitting: Internal Medicine

## 2019-03-16 DIAGNOSIS — J3489 Other specified disorders of nose and nasal sinuses: Secondary | ICD-10-CM | POA: Diagnosis not present

## 2019-03-16 DIAGNOSIS — R42 Dizziness and giddiness: Secondary | ICD-10-CM | POA: Diagnosis not present

## 2019-03-16 DIAGNOSIS — J340 Abscess, furuncle and carbuncle of nose: Secondary | ICD-10-CM | POA: Diagnosis not present

## 2019-03-16 DIAGNOSIS — G4733 Obstructive sleep apnea (adult) (pediatric): Secondary | ICD-10-CM | POA: Diagnosis not present

## 2019-03-16 NOTE — Telephone Encounter (Signed)
Can send her for a cpap titration study.

## 2019-03-16 NOTE — Telephone Encounter (Signed)
cpap titration has been ordered.  Pt is aware and voiced her understanding.  Nothing further is needed.

## 2019-03-18 ENCOUNTER — Telehealth: Payer: Self-pay | Admitting: Internal Medicine

## 2019-03-18 NOTE — Telephone Encounter (Signed)
Pt is aware that sleep med will contact her to schedule an appt.  Advised pt to contact our office if sleep med has not contacted her in one week.  Nothing further is needed at this time.

## 2019-03-20 ENCOUNTER — Encounter: Payer: Self-pay | Admitting: Internal Medicine

## 2019-03-22 ENCOUNTER — Encounter: Payer: Self-pay | Admitting: Internal Medicine

## 2019-03-22 NOTE — Patient Instructions (Signed)

## 2019-03-22 NOTE — Assessment & Plan Note (Signed)
Continue CPAP.  

## 2019-03-22 NOTE — Assessment & Plan Note (Signed)
CMET and Lipid profile today Encouraged her to consume a low fat diet. Continue Rosuvastatin, Fish Oil and CoQ10

## 2019-03-22 NOTE — Assessment & Plan Note (Signed)
CMET today Will monitor

## 2019-03-22 NOTE — Assessment & Plan Note (Signed)
Follows with pain management

## 2019-03-22 NOTE — Assessment & Plan Note (Signed)
No angina Continue ASA Monitor

## 2019-03-22 NOTE — Assessment & Plan Note (Signed)
Continue Paxil and Wellbutrin Support offered

## 2019-03-22 NOTE — Assessment & Plan Note (Signed)
A1C and urine microalbumin today Encouraged her to consume a low carb diet Continue Glipizide and Lyrica Foot exam today Encouraged yearly eye exam Flu and pneumovax UTD

## 2019-03-24 ENCOUNTER — Other Ambulatory Visit: Payer: Self-pay | Admitting: Cardiology

## 2019-03-24 DIAGNOSIS — R42 Dizziness and giddiness: Secondary | ICD-10-CM | POA: Diagnosis not present

## 2019-03-24 DIAGNOSIS — H93293 Other abnormal auditory perceptions, bilateral: Secondary | ICD-10-CM | POA: Diagnosis not present

## 2019-03-31 DIAGNOSIS — R42 Dizziness and giddiness: Secondary | ICD-10-CM | POA: Diagnosis not present

## 2019-03-31 DIAGNOSIS — G4733 Obstructive sleep apnea (adult) (pediatric): Secondary | ICD-10-CM | POA: Diagnosis not present

## 2019-03-31 DIAGNOSIS — J340 Abscess, furuncle and carbuncle of nose: Secondary | ICD-10-CM | POA: Diagnosis not present

## 2019-04-03 ENCOUNTER — Ambulatory Visit (INDEPENDENT_AMBULATORY_CARE_PROVIDER_SITE_OTHER): Payer: Medicare Other | Admitting: Internal Medicine

## 2019-04-03 ENCOUNTER — Other Ambulatory Visit: Payer: Self-pay

## 2019-04-03 VITALS — BP 136/84 | HR 67 | Temp 98.3°F | Wt 218.0 lb

## 2019-04-03 DIAGNOSIS — R1900 Intra-abdominal and pelvic swelling, mass and lump, unspecified site: Secondary | ICD-10-CM

## 2019-04-03 DIAGNOSIS — I2510 Atherosclerotic heart disease of native coronary artery without angina pectoris: Secondary | ICD-10-CM

## 2019-04-03 DIAGNOSIS — R609 Edema, unspecified: Secondary | ICD-10-CM | POA: Diagnosis not present

## 2019-04-03 DIAGNOSIS — I2583 Coronary atherosclerosis due to lipid rich plaque: Secondary | ICD-10-CM

## 2019-04-03 DIAGNOSIS — R140 Abdominal distension (gaseous): Secondary | ICD-10-CM | POA: Diagnosis not present

## 2019-04-03 DIAGNOSIS — I509 Heart failure, unspecified: Secondary | ICD-10-CM | POA: Diagnosis not present

## 2019-04-03 NOTE — Progress Notes (Signed)
Subjective:    Patient ID: Melinda [name], female    DOB: Jan 29, 1956, 63 y.o.   MRN: [id number]  HPI  Pt presents to the clinic today with c/o abdominal swelling and bloating. She noticed this 1 week ago. She has known fluid buildup around her intrathecal pain pump. She is scheduled to have that fluid removed in October. She denies nausea, vomiting, constipation or blood in her stool. She recently stopped HCTZ due to low blood pressure and lightheadedness. She reports the edema in her legs is much improved. She denies cough or shortness of breath. She has not tried anything OTC for this.  Review of Systems  Past Medical History:  Diagnosis Date  . 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 Amputation of second toe, right, traumatic ([place]) 09/2017  . Anemia    years ago  . Anxiety   . Blue toes    2nd toe on right foot, will get appt.  . Bulging disc   . CAD (coronary artery disease) 2009   s/p stent to LAD  . 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  Ablations done.  Now has rare episodes  . Chicken pox   . Chronic fatigue   . Chronic kidney disease    "problem with kidney filtration"  . Coronary arteritis   . Degenerative disc disease   . Depression   . Diabetes mellitus without complication ([place])   . Facet joint disease   . Fibromyalgia   . Heart disease   . Hyperlipidemia   . IBS (irritable bowel syndrome)   . MCL deficiency, knee   . MRSA (methicillin resistant staph aureus) culture positive 2011   GREAT TOE RIGHT FOOT  . Neuropathy 04/18/2010  . Peripheral neuropathy   . Restless leg syndrome   . Sepsis ([place]) 09/2013  . Sleep apnea 08/17/2003   uses CPAP, sleep study at [facility] (mild to moderate)  . Spinal stenosis     Current Outpatient Medications  Medication Sig Dispense Refill  . ACCU-CHEK AVIVA PLUS test strip TEST 1 TIME DAILY **E11.9** 200 each 2  . ACCU-CHEK SOFTCLIX LANCETS lancets USE TO CHECK BLOOD  SUGAR 1 TIME DAILY. DX: E11.42 100 each 2  . AMBULATORY NON FORMULARY MEDICATION Medication Name: CPAP MASK OF CHOICE FOR HOME DEVICE 1 each 0  . Ascorbic Acid (VITAMIN C GUMMIE PO) Take 2 tablets by mouth daily.    Marland Kitchen aspirin 81 MG tablet Take 81 mg by mouth daily.    Marland Kitchen buPROPion (WELLBUTRIN XL) 150 MG 24 hr tablet Take 1 tablet (150 mg total) by mouth daily. 90 tablet 0  . Cinnamon 500 MG TABS Take 1,000 mg by mouth daily.    . Coenzyme Q10 (CO Q 10) 100 MG CAPS Take 100 mg by mouth daily.     Marland Kitchen glucosamine-chondroitin 500-400 MG tablet Take 2 tablets by mouth daily.     Marland Kitchen lisinopril (ZESTRIL) 5 MG tablet Take 1 tablet (5 mg total) by mouth daily. (Patient taking differently: Take 2.5 mg by mouth daily. ) 90 tablet 3  . Magnesium 250 MG TABS Take 250 mg by mouth daily.    . Misc Natural Products (TART CHERRY ADVANCED PO) Take 1,200 mg by mouth at bedtime.     . Multiple Vitamin (MULTIVITAMIN) tablet Take 1 tablet by mouth daily.    . naloxone (NARCAN) 2 MG/2ML injection Inject 1 mL (1 mg total) into the muscle as needed for up  to 2 doses (for opioid overdose). Inject content of syringe into thigh muscle. Call 911. 2 Syringe 1  . nitroGLYCERIN (NITROSTAT) 0.4 MG SL tablet Place 1 tablet (0.4 mg total) under the tongue every 5 (five) minutes as needed for chest pain. Please keep upcoming appt in October. Thank you 25 tablet 1  . NONFORMULARY OR COMPOUNDED ITEM 147.95 mcg by Epidural Infusion route Continuous EPIDURAL. Medtronic Neuromodulation pump Fentanyl 147.95 mcg, Baclofen, bupivicaine    . NONFORMULARY OR COMPOUNDED ITEM 88.77 mcg by Epidural Infusion route Continuous EPIDURAL. Medtronic Neuromodulation pump: Fentanyl, baclofen 88.77, bupivicane    . NONFORMULARY OR COMPOUNDED ITEM 5.918 mg by Epidural Infusion route Continuous EPIDURAL. Medtronic Neuromodulation pump: Fentanyl, Baclofen, Bupivicaine 5.918 mg    . Omega-3 Fatty Acids (FISH OIL) 1000 MG CAPS Take 2,000 mg by mouth 2 (two) times  daily.     Marland Kitchen oxyCODONE (OXY IR/ROXICODONE) 5 MG immediate release tablet Take 1 tablet (5 mg total) by mouth every 8 (eight) hours as needed for severe pain. Must last 30 days. 90 tablet 0  . [START ON 04/04/2019] oxyCODONE (OXY IR/ROXICODONE) 5 MG immediate release tablet Take 1 tablet (5 mg total) by mouth every 8 (eight) hours as needed for severe pain. Must last 30 days. 90 tablet 0  . PARoxetine (PAXIL) 20 MG tablet TAKE ONE-HALF OF A TABLET BY MOUTH DAILY IN THE MORNING 90 tablet 0  . polyethylene glycol (MIRALAX / GLYCOLAX) packet Take 17 g by mouth every other day.     . pregabalin (LYRICA) 150 MG capsule Take 1 capsule (150 mg total) by mouth 3 (three) times daily. 270 capsule 0  . rosuvastatin (CRESTOR) 20 MG tablet TAKE 1 TABLET BY MOUTH EVERY DAY 90 tablet 0  . tiZANidine (ZANAFLEX) 4 MG tablet Take 1 tablet (4 mg total) by mouth 3 (three) times daily. 270 tablet 0  . Turmeric 500 MG TABS Take 500 mg by mouth 2 (two) times daily.     Marland Kitchen VITAMIN E PO Take 450 Units by mouth daily.     Marland Kitchen oxyCODONE (OXY IR/ROXICODONE) 5 MG immediate release tablet Take 1 tablet (5 mg total) by mouth every 8 (eight) hours as needed for severe pain. Must last 30 days. 90 tablet 0   No current facility-administered medications for this visit.     No Known Allergies  Family History  Problem Relation Age of Onset  . Lung cancer Mother   . Diabetes Father   . Heart disease Maternal Grandfather   . Diabetes Paternal Grandmother   . Colon cancer Paternal Grandfather   . Stroke Neg Hx     Social History   Socioeconomic History  . Marital status: Significant Other    Spouse name: Not on file  . Number of children: Not on file  . Years of education: Not on file  . Highest education level: Not on file  Occupational History  . Not on file  Social Needs  . Financial resource strain: Not on file  . Food insecurity    Worry: Not on file    Inability: Not on file  . Transportation needs    Medical:  Not on file    Non-medical: Not on file  Tobacco Use  . Smoking status: Former Smoker    Packs/day: 1.50    Years: 32.00    Pack years: 48.00    Types: Cigarettes    Quit date: 07/22/2007    Years since quitting: 11.7  . Smokeless tobacco: Never  Used  Substance and Sexual Activity  . Alcohol use: Yes    Alcohol/week: 0.0 standard drinks    Comment: occ - Holidays  . Drug use: No  . Sexual activity: Yes  Lifestyle  . Physical activity    Days per week: Not on file    Minutes per session: Not on file  . Stress: Not on file  Relationships  . Social Herbalist on phone: Not on file    Gets together: Not on file    Attends religious service: Not on file    Active member of club or organization: Not on file    Attends meetings of clubs or organizations: Not on file    Relationship status: Not on file  . Intimate partner violence    Fear of current or ex partner: Not on file    Emotionally abused: Not on file    Physically abused: Not on file    Forced sexual activity: Not on file  Other Topics Concern  . Not on file  Social History Narrative   Lives at home with a partner. Independent at baseline     Constitutional: Denies fever, malaise, fatigue, headache or abrupt weight changes.  Respiratory: Denies difficulty breathing, shortness of breath, cough or sputum production.   Cardiovascular: Denies chest pain, chest tightness, palpitations or swelling in the hands or feet.  Gastrointestinal: Pt reports abdominal swelling and bloating. Denies abdominal pain,  constipation, diarrhea or blood in the stool.  GU: Denies urgency, frequency, pain with urination, burning sensation, blood in urine, odor or discharge.  No other specific complaints in a complete review of systems (except as listed in HPI above).     Objective:   Physical Exam  BP 136/84   Pulse 67   Temp 98.3 F (36.8 C) (Temporal)   Wt 218 lb (98.9 kg)   LMP  (LMP Unknown)   SpO2 98%   BMI 32.19  kg/m  Wt Readings from Last 3 Encounters:  04/03/19 218 lb (98.9 kg)  03/12/19 226 lb (102.5 kg)  02/18/19 216 lb (98 kg)    General: Appears herstated age, obese,in NAD. Skin: Warm, dry and intact. No rashes, lesions or ulcerations noted. Cardiovascular: Normal rate and rhythm. S1,S2 noted.  No murmur, rubs or gallops noted. No JVD or BLE edema.  Pulmonary/Chest: Normal effort and positive vesicular breath sounds. No respiratory distress. No wheezes, rales or ronchi noted.  Abdomen: Soft and nontender. Distended, positive fluid wave. Increased RLQ swelling. Neurological: Alert and oriented.   BMET    Component Value Date/Time   NA 142 03/12/2019 1255   NA 135 (L) 06/30/2013 1203   K 4.4 03/12/2019 1255   K 4.1 06/30/2013 1203   CL 104 03/12/2019 1255   CL 102 06/30/2013 1203   CO2 33 (H) 03/12/2019 1255   CO2 32 06/30/2013 1203   GLUCOSE 96 03/12/2019 1255   GLUCOSE 143 (H) 06/30/2013 1203   BUN 24 (H) 03/12/2019 1255   BUN 19 (H) 06/30/2013 1203   CREATININE 1.08 03/12/2019 1255   CREATININE 0.89 06/30/2013 1203   CALCIUM 9.0 03/12/2019 1255   CALCIUM 9.7 06/30/2013 1203   GFRNONAA 58 (L) 12/10/2018 1330   GFRNONAA >60 06/30/2013 1203   GFRAA >60 12/10/2018 1330   GFRAA >60 06/30/2013 1203    Lipid Panel     Component Value Date/Time   CHOL 102 03/12/2019 1255   TRIG 158.0 (H) 03/12/2019 1255   HDL 34.30 (L) 03/12/2019  1255   CHOLHDL 3 03/12/2019 1255   VLDL 31.6 03/12/2019 1255   LDLCALC 36 03/12/2019 1255    CBC    Component Value Date/Time   WBC 4.1 03/12/2019 1255   RBC 4.32 03/12/2019 1255   HGB 12.5 03/12/2019 1255   HGB 14.0 11/12/2011 1136   HCT 37.5 03/12/2019 1255   HCT 40.7 11/12/2011 1136   PLT 189.0 03/12/2019 1255   PLT 274 11/12/2011 1136   MCV 86.7 03/12/2019 1255   MCV 86 11/12/2011 1136   MCH 28.5 12/10/2018 1330   MCHC 33.3 03/12/2019 1255   RDW 12.8 03/12/2019 1255   RDW 12.8 11/12/2011 1136   LYMPHSABS 1.8 08/14/2017 1950    LYMPHSABS 2.5 11/12/2011 1136   MONOABS 0.6 08/14/2017 1950   MONOABS 0.8 11/12/2011 1136   EOSABS 0.3 08/14/2017 1950   EOSABS 0.6 11/12/2011 1136   BASOSABS 0.0 08/14/2017 1950   BASOSABS 0.1 11/12/2011 1136    Hgb A1C Lab Results  Component Value Date   HGBA1C 6.0 03/12/2019            Assessment & Plan:   Abdominal Swelling and Distention:  Will check BNP today Will obtain CT abdomen/pelvis  Will follow up after labs and xray, ER/return precautions discussed [name], NP

## 2019-04-04 ENCOUNTER — Encounter: Payer: Self-pay | Admitting: Internal Medicine

## 2019-04-04 LAB — BRAIN NATRIURETIC PEPTIDE: Brain Natriuretic Peptide: 35 pg/mL (ref <100)

## 2019-04-04 NOTE — Patient Instructions (Signed)
Ascites ° °Ascites is a collection of too much fluid in the abdomen. Ascites can range from mild to severe. If ascites is not treated, it can get worse. °What are the causes? °This condition may be caused by: °· A liver condition called cirrhosis. This is the most common cause of ascites. °· Long-term (chronic) or alcoholic hepatitis. °· Infection or inflammation in the abdomen. °· Cancer in the abdomen. °· Heart failure. °· Kidney disease. °· Inflammation of the pancreas. °· Clots in the veins of the liver. °What are the signs or symptoms? °Symptoms of this condition include: °· A feeling of fullness in the abdomen. This is common. °· An increase in the size of the abdomen or waist. °· Swelling in the legs. °· Swelling of the scrotum (in men). °· Difficulty breathing. °· Pain in the abdomen. °· Sudden weight gain. °If the condition is mild, you may not have symptoms. °How is this diagnosed? °This condition is diagnosed based on your medical history and a physical exam. Your health care provider may order imaging tests, such as an ultrasound or CT scan of your abdomen. °How is this treated? °Treatment for this condition depends on the cause of the ascites. It may include: °· Taking a pill to make you urinate. This is called a water pill (diuretic pill). °· Strictly reducing your salt (sodium) intake. Salt can cause extra fluid to be kept (retained) in the body, and this makes ascites worse. °· Having a procedure to remove fluid from your abdomen (paracentesis). °· Having a procedure that connects two of the major veins within your liver and relieves pressure on your liver. This is called a TIPS procedure (transjugular intrahepatic portosystemic shunt procedure). °· Placement of a drainage catheter (peritoneovenous shunt) to manage the extra fluid in the abdomen. °Ascites may go away or improve when the condition that caused it is treated. °Follow these instructions at home: °· Keep track of your weight. To do this,  weigh yourself at the same time every day and write down your weight. °· Keep track of how much you drink and any changes in how much or how often you urinate. °· Follow any instructions that your health care provider gives you about how much to drink. °· Try not to eat salty (high-sodium) foods. °· Take over-the-counter and prescription medicines only as told by your health care provider. °· Keep all follow-up visits as told by your health care provider. This is important. °· Report any changes in your health to your health care provider, especially if you develop new symptoms or your symptoms get worse. °Contact a health care provider if: °· You gain more than 3 lb (1.36 kg) in 3 days. °· Your waist size increases. °· You have new swelling in your legs. °· The swelling in your legs gets worse. °Get help right away if: °· You have a fever. °· You are confused. °· You have new or worsening breathing trouble. °· You have new or worsening pain in your abdomen. °· You have new or worsening swelling in the scrotum (in men). °Summary °· Ascites is a collection of too much fluid in the abdomen. °· Ascites may be caused by various conditions, such as cirrhosis, hepatitis, cancer, or congestive heart failure. °· Symptoms may include swelling of the abdomen and other areas due to extra fluid in the body. °· Treatments may involve dietary changes, medicines, or procedures. °This information is not intended to replace advice given to you by your health care   provider. Make sure you discuss any questions you have with your health care provider. °Document Released: 06/18/2005 Document Revised: 05/20/2018 Document Reviewed: 02/28/2017 °Elsevier Patient Education © 2020 [employer] ° °

## 2019-04-06 ENCOUNTER — Encounter: Payer: Self-pay | Admitting: Internal Medicine

## 2019-04-07 ENCOUNTER — Telehealth: Payer: Self-pay

## 2019-04-07 ENCOUNTER — Other Ambulatory Visit: Payer: Self-pay | Admitting: Internal Medicine

## 2019-04-07 ENCOUNTER — Telehealth: Payer: Self-pay | Admitting: Internal Medicine

## 2019-04-07 DIAGNOSIS — R1900 Intra-abdominal and pelvic swelling, mass and lump, unspecified site: Secondary | ICD-10-CM

## 2019-04-07 NOTE — Telephone Encounter (Signed)
Patient returned Melinda [name]'s call.

## 2019-04-07 NOTE — Telephone Encounter (Signed)
I called and left patient a message about appointment tomorrow with Dr. Marlou Porch, he will not be in the office tomorrow, he will be doing telehealth visits, or we can reschedule if patient does not want to do telehealth.

## 2019-04-08 ENCOUNTER — Ambulatory Visit: Payer: Medicare Other | Admitting: Cardiology

## 2019-04-08 ENCOUNTER — Encounter: Payer: Self-pay | Admitting: Internal Medicine

## 2019-04-09 ENCOUNTER — Ambulatory Visit: Payer: Medicare Other | Admitting: Cardiology

## 2019-04-09 ENCOUNTER — Telehealth: Payer: Self-pay | Admitting: Internal Medicine

## 2019-04-09 ENCOUNTER — Other Ambulatory Visit: Payer: Self-pay

## 2019-04-09 NOTE — Progress Notes (Signed)
CARDIOLOGY OFFICE NOTE  Date:  04/15/2019    Melinda [name] Date of Birth: [birth year] Medical Record #[id number]  PCP:  Jearld Fenton, NP  Cardiologist:  Marlou Porch    Chief Complaint  Patient presents with   Follow-up    History of Present Illness: Melinda [name] is a 63 y.o. female who presents today for a 14 month follow up/work in visit. Seen for Dr. Marlou Porch.   She has a history of AVNRT, unsuccessful ablation in 1992, previously on low-dose flecainide therapy since (not started by [name]), CAD with prior DES LAD stent in 2009. Cardiac catheterization 2012 revealed patent LAD stent.   She underwent a nuclear stress test 2014 which demonstrated mild anterolateral ischemia with normal ejection fraction. This was a very similar finding asked to prior stress test in August of 2012 which resulted in catheterization in August of 2012 which showed patent LAD stent, DES. There was a diagonal branch jailed by stent with minor luminal irregularity at the ostium. EF was normal. Because of these symptoms, beta blocker was started.   She has been followed since by Dr. Marlou Porch - a variety of complaints noted - she has had issues with fatigue, palpitations, sepsis back in 2015, had had cervical spine surgery, some OA issues, and prior near syncope. Off Imdur due to low BP and off beta blocker due to fatigue.   The patient does not have symptoms concerning for COVID-19 infection (fever, chills, cough, or new shortness of breath).   Comes in today. Here alone. She has her partner on speaker phone - Brooke. She has a variety of complaints. Has not felt well for over 2 months. She has had positional dizziness - almost to the point she will fall/pass out. No frank syncope. She has chronic pain with her fibromyalgia - she has a continuous epidural pump in place in the right lower belly - apparently this has needed aspirating on more than one occasion due to fluid collection. She now feels very swollen  across her entire abdomen now - feels like "I look pregnant". For CT scan of abdomen and pelvis tomorrow. She has lost weight since last here. Does eat some takeout and probably gets salt. Has had some mid-sternal chest pain off and on but nothing that has sounded exertional and not really like her prior chest pain syndrome. She is trying to walk but has issues with her feet that hinders this. BP readings typically low - she is using a wrist cuff too. She has had a BNP that was normal. She is to have sleep study this week. She is to see neurology next month. Her feelings of pre syncope involve getting lightheaded, weak and with ears ringing. Her ACE has been cut back but apparently she is on this for "stage 3 CKD". Last creatinine is noted. She says she is diabetic - last A1C was 6.  She is on multiple pain medicines along with muscle relaxers along with Lyrica/Paxil. She has had sore neck for months. She has also had bouts of numbness and tingling in the left hand/thumb - resolves typically with changing positions.   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   Bulging disc    CAD (coronary  artery disease) 2009   s/p stent to LAD   Cardiac arrhythmia due to congenital heart disease    WPW.  Ablations done.  Now has rare episodes   Chicken pox    Chronic fatigue    Chronic kidney disease    "problem with kidney filtration"   Coronary arteritis    Degenerative disc disease    Depression    Diabetes mellitus without complication (HCC)    Facet joint disease    Fibromyalgia    Heart disease    Hyperlipidemia    IBS (irritable bowel syndrome)    MCL deficiency, knee    MRSA (methicillin resistant staph aureus) culture positive 2011   GREAT TOE RIGHT FOOT   Neuropathy  04/18/2010   Peripheral neuropathy    Restless leg syndrome    Sepsis ([place]) 09/2013   Sleep apnea 08/17/2003   uses CPAP, sleep study at [facility] (mild to moderate)   Spinal stenosis     Past Surgical History:  Procedure Laterality Date   ABLATION     UTERUS   ABLATION     HEART   AMPUTATION TOE Right 02/01/2017   Procedure: AMPUTATION TOE-RIGHT 2ND MPJ;  Surgeon: Albertine Patricia, DPM;  Location: [facility];  Service: Podiatry;  Laterality: Right;   ANTERIOR CERVICAL DECOMP/DISCECTOMY FUSION N/A 07/28/2014   Procedure: ANTERIOR CERVICAL DECOMPRESSION/DISCECTOMY FUSION CERVICAL 3-4,4-5,5-6 LEVELS WITH INSTRUMENTATION AND ALLOGRAFT;  Surgeon: Sinclair Ship, MD;  Location: [place];  Service: Orthopedics;  Laterality: N/A;  Anterior cervical decompression fusion, cervical 3-4, cervical 4-5, cervical 5-6 with instrumentation and allograft   BACK SURGERY     X 3 1979, 1994, 1995   CARPAL TUNNEL RELEASE Right    CHOLECYSTECTOMY  2003   CORONARY ANGIOPLASTY  2008   EYE SURGERY Bilateral 2013   Eyelid lift    FOOT SURGERY Right    BIG TOE   FOOT SURGERY Bilateral    PLANTAR FASCIITIS   FOOT SURGERY Right    2ND TOE   GALLBLADDER SURGERY     HAMMER TOE SURGERY Right 10/17/2017   Procedure: HAMMER TOE CORRECTION-4TH TOE;  Surgeon: Albertine Patricia, DPM;  Location: [place];  Service: Podiatry;  Laterality: Right;  LMA- WITH LOCAL Diabetic - diet controlled   HAND SURGERY Left    HAND SURGEY Left    HEART STENT  2009   LAD   INFUSION PUMP IMPLANTATION     X2 with morphine and baclofen   INTRATHECAL PUMP REVISION N/A 04/25/2018   Procedure: Intrathecal pump replacement;  Surgeon: Clydell Hakim, MD;  Location: [place];  Service: Neurosurgery;  Laterality: N/A;  right   INTRATHECAL PUMP REVISION Right 04/25/2018   INTRATHECAL PUMP REVISION N/A 12/12/2018   Procedure: Intrathecal pump revision with exploration of pocket;  Surgeon: Clydell Hakim, MD;  Location: [place];  Service: Neurosurgery;  Laterality: N/A;  Intrathecal pump revision with exploration of pocket   IRRIGATION AND DEBRIDEMENT FOOT Right 02/23/2017   Procedure: IRRIGATION AND DEBRIDEMENT FOOT-right foot;  Surgeon: Samara Deist, DPM;  Location: [facility];  Service: Podiatry;  Laterality: Right;   PAIN PUMP REVISION N/A 08/15/2018   Procedure: Intrathecal PUMP REVISION;  Surgeon: Clydell Hakim, MD;  Location: [place];  Service: Neurosurgery;  Laterality: N/A;  INTRATHECAL PUMP REVISION   TOE SURGERY     then revision 8/18     Medications: Current Meds  Medication Sig   ACCU-CHEK AVIVA PLUS test strip TEST 1 TIME DAILY **E11.9**  ACCU-CHEK SOFTCLIX LANCETS lancets USE TO CHECK BLOOD SUGAR 1 TIME DAILY. DX: E11.42   AMBULATORY NON FORMULARY MEDICATION Medication Name: CPAP MASK OF CHOICE FOR HOME DEVICE   aspirin 81 MG tablet Take 81 mg by mouth daily.   buPROPion (WELLBUTRIN XL) 150 MG 24 hr tablet Take 1 tablet (150 mg total) by mouth daily.   Cinnamon 500 MG TABS Take 1,000 mg by mouth daily.   Coenzyme Q10 (CO Q 10) 100 MG CAPS Take 100 mg by mouth daily.    fluticasone (FLONASE) 50 MCG/ACT nasal spray Place 2 sprays into both nostrils daily.   gentamicin ointment (GARAMYCIN) 0.1 % APPLY TO NOSE THREE TIMES DAILY   glucosamine-chondroitin 500-400 MG tablet Take 2 tablets by mouth daily.    Magnesium 250 MG TABS Take 250 mg by mouth daily.   Misc Natural Products (TART CHERRY ADVANCED PO) Take 1,200 mg by mouth at bedtime.    Multiple Vitamin (MULTIVITAMIN) tablet Take 1 tablet by mouth daily.   naloxone (NARCAN) 2 MG/2ML injection Inject 1 mL (1 mg total) into the muscle as needed for up to 2 doses (for opioid overdose). Inject content of syringe into thigh muscle. Call 911.   nitroGLYCERIN (NITROSTAT) 0.4 MG SL tablet Place 1 tablet (0.4 mg total) under the tongue every 5 (five) minutes as needed for chest pain. Please keep upcoming appt in  October. Thank you   NONFORMULARY OR COMPOUNDED ITEM 147.95 mcg by Epidural Infusion route Continuous EPIDURAL. Medtronic Neuromodulation pump Fentanyl 147.95 mcg, Baclofen, bupivicaine   NONFORMULARY OR COMPOUNDED ITEM 88.77 mcg by Epidural Infusion route Continuous EPIDURAL. Medtronic Neuromodulation pump: Fentanyl, baclofen 88.77, bupivicane   NONFORMULARY OR COMPOUNDED ITEM 5.918 mg by Epidural Infusion route Continuous EPIDURAL. Medtronic Neuromodulation pump: Fentanyl, Baclofen, Bupivicaine 5.918 mg   Omega-3 Fatty Acids (FISH OIL) 1000 MG CAPS Take 2,000 mg by mouth 2 (two) times daily.    oxyCODONE (OXY IR/ROXICODONE) 5 MG immediate release tablet Take 1 tablet (5 mg total) by mouth every 8 (eight) hours as needed for severe pain. Must last 30 days.   PARoxetine (PAXIL) 20 MG tablet TAKE ONE-HALF OF A TABLET BY MOUTH DAILY IN THE MORNING   polyethylene glycol (MIRALAX / GLYCOLAX) packet Take 17 g by mouth daily.    pregabalin (LYRICA) 150 MG capsule Take 1 capsule (150 mg total) by mouth 3 (three) times daily.   rosuvastatin (CRESTOR) 20 MG tablet TAKE 1 TABLET BY MOUTH EVERY DAY   tiZANidine (ZANAFLEX) 4 MG tablet Take 1 tablet (4 mg total) by mouth 3 (three) times daily.   Turmeric 500 MG TABS Take 500 mg by mouth 2 (two) times daily.    VITAMIN E PO Take 450 Units by mouth daily.    [DISCONTINUED] lisinopril (ZESTRIL) 5 MG tablet Take 2.5 mg by mouth daily.     Allergies: No Known Allergies  Social History: The patient  reports that she quit smoking about 11 years ago. Her smoking use included cigarettes. She has a 48.00 pack-year smoking history. She has never used smokeless tobacco. She reports current alcohol use. She reports that she does not use drugs.   Family History: The patient's family history includes Colon cancer in her paternal grandfather; Diabetes in her father and paternal grandmother; Heart disease in her maternal grandfather; Lung cancer in her  mother.   Review of Systems: Please see the history of present illness.   All other systems are reviewed and negative.   Physical Exam: VS:  BP  130/82    Pulse 76    Ht 5\' 10"  (1.778 m)    Wt 220 lb 6.4 oz (100 kg)    LMP  (LMP Unknown)    SpO2 96%    BMI 31.62 kg/m  .  BMI Body mass index is 31.62 kg/m.  Wt Readings from Last 3 Encounters:  04/15/19 220 lb 6.4 oz (100 kg)  04/03/19 218 lb (98.9 kg)  03/12/19 226 lb (102.5 kg)    General: Alert and in no acute distress.  HEENT: Normal.  Neck: Supple, no JVD, carotid bruits, or masses noted.  Cardiac: Regular rate and rhythm. No murmurs, rubs, or gallops. No edema.  Respiratory:  Lungs are clear to auscultation bilaterally with normal work of breathing.  GI: Soft and nontender. She has an indwelling pump in the right lower belly.  MS: No deformity or atrophy. Gait and ROM intact.  Skin: Warm and dry. Color is normal.  Neuro:  Strength and sensation are intact and no gross focal deficits noted.  Psych: Alert, appropriate and with normal affect.   LABORATORY DATA:  EKG:  EKG is ordered today. This demonstrates NSR.  Lab Results  Component Value Date   WBC 4.1 03/12/2019   HGB 12.5 03/12/2019   HCT 37.5 03/12/2019   PLT 189.0 03/12/2019   GLUCOSE 96 03/12/2019   CHOL 102 03/12/2019   TRIG 158.0 (H) 03/12/2019   HDL 34.30 (L) 03/12/2019   LDLDIRECT 63.0 11/04/2017   LDLCALC 36 03/12/2019   ALT 18 03/12/2019   AST 20 03/12/2019   NA 142 03/12/2019   K 4.4 03/12/2019   CL 104 03/12/2019   CREATININE 1.08 03/12/2019   BUN 24 (H) 03/12/2019   CO2 33 (H) 03/12/2019   TSH 1.46 03/12/2019   INR 1.12 09/11/2015   HGBA1C 6.0 03/12/2019   MICROALBUR 3.3 (H) 03/12/2019     BNP (last 3 results) Recent Labs    04/03/19 1522  BNP 35    ProBNP (last 3 results) No results for input(s): PROBNP in the last 8760 hours.   Other Studies Reviewed Today:  Echo Study Conclusions 08/2015  - Left ventricle: The cavity size  was normal. Wall thickness was   normal. Systolic function was vigorous. The estimated ejection   fraction was in the range of 65% to 70%. Wall motion was normal;   there were no regional wall motion abnormalities. There was no   evidence of elevated ventricular filling pressure by Doppler   parameters. - Left atrium: The atrium was mildly dilated.   CARDIAC CATH 2012 IMPRESSION: 1. Previously placed mid LAD drug-eluting stent in 2009 is widely     patent.  There is only mild luminal irregularity at the diagonal     branch, which is covered by the stent.  Otherwise, widely patent     coronary arteries. 2. Normal left ventricular ejection fraction of 60% with no wall     motion abnormalities without any significant mitral regurgitation     or aortic stenosis detected.  PLAN:  Reassuring cardiac catheterization.  Continue with aggressive risk factor modification.  Several other possible etiologies for her previously described symptoms.  I will see her back in follow-up for postcatheterization care.  Terumo T band was placed on the right radial artery site without difficulty and she tolerated the procedure well. Allen's test was normal pre and post procedure.   Jerline Pain, MD MCS/MEDQ  D:  02/27/2011  T:  02/27/2011  Job:  [id number]  Assessment/Plan:   1. Multitude of somatic complaints - primarily with pre syncope with orthostasis - using a wrist cuff - would stop the ACE. Will get her echo updated and make sure there is no heart failure due to the swelling in her belly and that structurally her heart is ok. May end up needing Midodrine to keep BP up. She needs her cuff checked for accuracy. Her BP here today is fine. Further disposition to follow.   2. Swelling in the abdomen - she has an indwelling device in place for pain management - for CT scan tomorrow.   3. CAD - remote LAD stent - I do not get the feeling that this is angina. Needs good CV risk factor modification.    4. Chronic fatigue/chronic pain - on multiple agents.   5. Palpitations - prior AVNRT with unsuccessful ablation - EKG today is ok. She has not tolerated beta blocker in the past due to fatigue.    6. Carotid disease - noted 40 to 59% bilateral stenosis - last study was from 2016 - was to be updated one year later - we will try to arrange.    7. Obesity - she is down from 238# from here last visit.   8. HLD - on statin therapy  9. Prior toe amputation x 2 - not discussed  10. CKD - while ACE would be ideal to be on - we do not need her to have fall/syncope and suffer injury. I am stopping this today.   10. COVID-19 Education: The signs and symptoms of COVID-19 were discussed with the patient and how to seek care for testing (follow up with PCP or arrange E-visit).  The importance of social distancing, staying at home, hand hygiene and wearing a mask when out in public were discussed today.  Current medicines are reviewed with the patient today.  The patient does not have concerns regarding medicines other than what has been noted above.  The following changes have been made:  See above.  Labs/ tests ordered today include:    Orders Placed This Encounter  Procedures   EKG 12-Lead   ECHOCARDIOGRAM COMPLETE   VAS US CAROTID     Disposition:   FU with [place] in about a month - check her BP cuff and orthostatics on return.    Patient is agreeable to this plan and will call if any problems develop in the interim.   SignedTruitt Merle, NP  04/15/2019 4:26 PM  [place] [address] [place], [place]  [address] Phone: [phone] Fax: [phone]

## 2019-04-09 NOTE — Telephone Encounter (Signed)
Pt is aware of date/time of covid test.   

## 2019-04-13 ENCOUNTER — Other Ambulatory Visit: Payer: Self-pay

## 2019-04-13 ENCOUNTER — Other Ambulatory Visit
Admission: RE | Admit: 2019-04-13 | Discharge: 2019-04-13 | Disposition: A | Discharge status: Home / Self Care | Payer: Medicare Other | Source: Ambulatory Visit | Attending: Internal Medicine | Admitting: Internal Medicine

## 2019-04-13 DIAGNOSIS — Z01812 Encounter for preprocedural laboratory examination: Secondary | ICD-10-CM | POA: Insufficient documentation

## 2019-04-13 DIAGNOSIS — Z20828 Contact with and (suspected) exposure to other viral communicable diseases: Secondary | ICD-10-CM | POA: Diagnosis not present

## 2019-04-13 NOTE — Telephone Encounter (Signed)
Patient has office visit on 04/15/19 with Truitt Merle.

## 2019-04-14 DIAGNOSIS — Z89421 Acquired absence of other right toe(s): Secondary | ICD-10-CM | POA: Diagnosis not present

## 2019-04-14 DIAGNOSIS — E1142 Type 2 diabetes mellitus with diabetic polyneuropathy: Secondary | ICD-10-CM | POA: Diagnosis not present

## 2019-04-14 DIAGNOSIS — B351 Tinea unguium: Secondary | ICD-10-CM | POA: Diagnosis not present

## 2019-04-14 DIAGNOSIS — L851 Acquired keratosis [keratoderma] palmaris et plantaris: Secondary | ICD-10-CM | POA: Diagnosis not present

## 2019-04-14 LAB — SARS CORONAVIRUS 2 (TAT 6-24 HRS): SARS Coronavirus 2: NEGATIVE

## 2019-04-15 ENCOUNTER — Ambulatory Visit (INDEPENDENT_AMBULATORY_CARE_PROVIDER_SITE_OTHER): Payer: Medicare Other | Admitting: Nurse Practitioner

## 2019-04-15 ENCOUNTER — Other Ambulatory Visit: Payer: Self-pay

## 2019-04-15 ENCOUNTER — Encounter: Payer: Self-pay | Admitting: Nurse Practitioner

## 2019-04-15 VITALS — BP 130/82 | HR 76 | Ht 70.0 in | Wt 220.4 lb

## 2019-04-15 DIAGNOSIS — E7800 Pure hypercholesterolemia, unspecified: Secondary | ICD-10-CM

## 2019-04-15 DIAGNOSIS — G894 Chronic pain syndrome: Secondary | ICD-10-CM

## 2019-04-15 DIAGNOSIS — I6523 Occlusion and stenosis of bilateral carotid arteries: Secondary | ICD-10-CM | POA: Diagnosis not present

## 2019-04-15 DIAGNOSIS — I2510 Atherosclerotic heart disease of native coronary artery without angina pectoris: Secondary | ICD-10-CM

## 2019-04-15 DIAGNOSIS — I2583 Coronary atherosclerosis due to lipid rich plaque: Secondary | ICD-10-CM | POA: Diagnosis not present

## 2019-04-15 DIAGNOSIS — R609 Edema, unspecified: Secondary | ICD-10-CM

## 2019-04-15 NOTE — Patient Instructions (Signed)
After Visit Summary:  We will be checking the following labs today - NONE   Medication Instructions:    Continue with your current medicines. BUT  STOP LISINOPRIL   If you need a refill on your cardiac medications before your next appointment, please call your pharmacy.     Testing/Procedures To Be Arranged:  Echocardiogram  Follow-Up:   See Dr. Marlou Porch in about a month    At [facility], you and your health needs are our priority.  As part of our continuing mission to provide you with exceptional heart care, we have created designated Provider Care Teams.  These Care Teams include your primary Cardiologist (physician) and Advanced Practice Providers (APPs -  Physician Assistants and Nurse Practitioners) who all work together to provide you with the care you need, when you need it.  Special Instructions:  . Stay safe, stay home, wash your hands for at least 20 seconds and wear a mask when out in public.  . It was good to talk with you today.  . Try to get a new BP cuff - one for your upper arm - Omron is the better brand   Call the [place] office at [phone] if you have any questions, problems or concerns.

## 2019-04-16 ENCOUNTER — Other Ambulatory Visit: Payer: Self-pay

## 2019-04-16 ENCOUNTER — Ambulatory Visit: Payer: Medicare Other

## 2019-04-16 ENCOUNTER — Ambulatory Visit
Admission: RE | Admit: 2019-04-16 | Discharge: 2019-04-16 | Disposition: A | Discharge status: Home / Self Care | Payer: Medicare Other | Source: Ambulatory Visit | Attending: Internal Medicine | Admitting: Internal Medicine

## 2019-04-16 DIAGNOSIS — R1900 Intra-abdominal and pelvic swelling, mass and lump, unspecified site: Secondary | ICD-10-CM | POA: Diagnosis not present

## 2019-04-16 DIAGNOSIS — Z969 Presence of functional implant, unspecified: Secondary | ICD-10-CM | POA: Insufficient documentation

## 2019-04-16 DIAGNOSIS — Z9049 Acquired absence of other specified parts of digestive tract: Secondary | ICD-10-CM | POA: Diagnosis not present

## 2019-04-16 DIAGNOSIS — G4733 Obstructive sleep apnea (adult) (pediatric): Secondary | ICD-10-CM | POA: Diagnosis not present

## 2019-04-16 DIAGNOSIS — I700 Atherosclerosis of aorta: Secondary | ICD-10-CM | POA: Diagnosis not present

## 2019-04-16 HISTORY — DX: Disorder of kidney and ureter, unspecified: N28.9

## 2019-04-16 MED ORDER — IOHEXOL 300 MG/ML  SOLN
100.0000 mL | Freq: Once | INTRAMUSCULAR | Status: AC | PRN
  Administered 2019-04-16: 100 mL via INTRAVENOUS

## 2019-04-16 NOTE — Progress Notes (Signed)
[name] [address], [place] [address] Phone: [phone] Subjective:   Melinda [name], am serving as a scribe for Dr. Hulan Saas.   CC: Right knee pain  BDZ:[id number]   07/29/2018 Patient has been doing relatively well with conservative therapy.  Right knee still has instability but significant decrease in inflammation and swelling from previous exam.  Discussed with patient about the possibility of viscosupplementation.  Patient was to continue with conservative therapy at this time.  Follow-up with me again in 6 weeks  Update 04/17/2019 Melinda [name] is a 63 y.o. female coming in with complaint of right knee pain. Has had injections previously. Pain started 4 weeks ago. Pain with flexion. Does have instability. Does have custom that she wears prn. Uses copperfit sleeve more often.   Patient does have a custom brace but does not wear it regularly.  Has been doing fairly well but now having significantly worsening pain again.  Because patient is having difficulty with walking her lower back with spinal stenosis is starting to hurt more as well.  Past Medical History:  Diagnosis Date  . 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 Amputation of second toe, right, traumatic ([place]) 09/2017  . Anemia    years ago  . Anxiety   . Blue toes    2nd toe on right foot, will get appt.  . Bulging disc   . CAD (coronary artery disease) 2009   s/p stent to LAD  . 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  Ablations done.  Now has rare episodes  . Chicken pox   . Chronic fatigue   . Chronic kidney disease    "problem with kidney filtration"  . Coronary arteritis   . Degenerative disc disease   . Depression   . Diabetes mellitus without complication ([place])   . Facet joint disease   . Fibromyalgia   . Heart disease   . Hyperlipidemia   . IBS (irritable bowel syndrome)   . MCL deficiency, knee   . MRSA  (methicillin resistant staph aureus) culture positive 2011   GREAT TOE RIGHT FOOT  . Neuropathy 04/18/2010  . Peripheral neuropathy   . Renal insufficiency   . Restless leg syndrome   . Sepsis ([place]) 09/2013  . Sleep apnea 08/17/2003   uses CPAP, sleep study at [facility] (mild to moderate)  . Spinal stenosis    Past Surgical History:  Procedure Laterality Date  . ABLATION     UTERUS  . ABLATION     HEART  . AMPUTATION TOE Right 02/01/2017   Procedure: AMPUTATION TOE-RIGHT 2ND MPJ;  Surgeon: Albertine Patricia, DPM;  Location: [facility];  Service: Podiatry;  Laterality: Right;  . ANTERIOR CERVICAL DECOMP/DISCECTOMY FUSION N/A 07/28/2014   Procedure: ANTERIOR CERVICAL DECOMPRESSION/DISCECTOMY FUSION CERVICAL 3-4,4-5,5-6 LEVELS WITH INSTRUMENTATION AND ALLOGRAFT;  Surgeon: Sinclair Ship, MD;  Location: [place];  Service: Orthopedics;  Laterality: N/A;  Anterior cervical decompression fusion, cervical 3-4, cervical 4-5, cervical 5-6 with instrumentation and allograft  . BACK SURGERY     X 3 1979, 1994, 1995  . CARPAL TUNNEL RELEASE Right   . CHOLECYSTECTOMY  2003  . CORONARY ANGIOPLASTY  2008  . EYE SURGERY Bilateral 2013   Eyelid lift   . FOOT SURGERY Right    BIG TOE  . FOOT SURGERY Bilateral    PLANTAR FASCIITIS  . FOOT SURGERY Right    2ND TOE  .  GALLBLADDER SURGERY    . HAMMER TOE SURGERY Right 10/17/2017   Procedure: HAMMER TOE CORRECTION-4TH TOE;  Surgeon: Albertine Patricia, DPM;  Location: [place];  Service: Podiatry;  Laterality: Right;  LMA- WITH LOCAL Diabetic - diet controlled  . HAND SURGERY Left   . HAND SURGEY Left   . HEART STENT  2009   LAD  . INFUSION PUMP IMPLANTATION     X2 with morphine and baclofen  . INTRATHECAL PUMP REVISION N/A 04/25/2018   Procedure: Intrathecal pump replacement;  Surgeon: Clydell Hakim, MD;  Location: [place];  Service: Neurosurgery;  Laterality: N/A;  right  . INTRATHECAL PUMP REVISION Right 04/25/2018  .  INTRATHECAL PUMP REVISION N/A 12/12/2018   Procedure: Intrathecal pump revision with exploration of pocket;  Surgeon: Clydell Hakim, MD;  Location: [place];  Service: Neurosurgery;  Laterality: N/A;  Intrathecal pump revision with exploration of pocket  . IRRIGATION AND DEBRIDEMENT FOOT Right 02/23/2017   Procedure: IRRIGATION AND DEBRIDEMENT FOOT-right foot;  Surgeon: Samara Deist, DPM;  Location: [facility];  Service: Podiatry;  Laterality: Right;  . PAIN PUMP REVISION N/A 08/15/2018   Procedure: Intrathecal PUMP REVISION;  Surgeon: Clydell Hakim, MD;  Location: [place];  Service: Neurosurgery;  Laterality: N/A;  INTRATHECAL PUMP REVISION  . TOE SURGERY     then revision 8/18   Social History   Socioeconomic History  . Marital status: Significant Other    Spouse name: Not on file  . Number of children: Not on file  . Years of education: Not on file  . Highest education level: Not on file  Occupational History  . Not on file  Social Needs  . Financial resource strain: Not on file  . Food insecurity    Worry: Not on file    Inability: Not on file  . Transportation needs    Medical: Not on file    Non-medical: Not on file  Tobacco Use  . Smoking status: Former Smoker    Packs/day: 1.50    Years: 32.00    Pack years: 48.00    Types: Cigarettes    Quit date: 07/22/2007    Years since quitting: 11.7  . Smokeless tobacco: Never Used  Substance and Sexual Activity  . Alcohol use: Yes    Alcohol/week: 0.0 standard drinks    Comment: occ - Holidays  . Drug use: [name]  . Sexual activity: Yes  Lifestyle  . Physical activity    Days per week: Not on file    Minutes per session: Not on file  . Stress: Not on file  Relationships  . Social Herbalist on phone: Not on file    Gets together: Not on file    Attends religious service: Not on file    Active member of club or organization: Not on file    Attends meetings of clubs or organizations: Not on file    Relationship status:  Not on file  Other Topics Concern  . Not on file  Social History Narrative   Lives at home with a partner. Independent at baseline   [name] Known Allergies Family History  Problem Relation Age of Onset  . Lung cancer Mother   . Diabetes Father   . Heart disease Maternal Grandfather   . Diabetes Paternal Grandmother   . Colon cancer Paternal Grandfather   . Stroke Neg Hx      Current Outpatient Medications (Cardiovascular):  .  nitroGLYCERIN (NITROSTAT) 0.4 MG SL tablet, Place  1 tablet (0.4 mg total) under the tongue every 5 (five) minutes as needed for chest pain. Please keep upcoming appt in October. Thank you .  rosuvastatin (CRESTOR) 20 MG tablet, TAKE 1 TABLET BY MOUTH EVERY DAY  Current Outpatient Medications (Respiratory):  .  fluticasone (FLONASE) 50 MCG/ACT nasal spray, Place 2 sprays into both nostrils daily.  Current Outpatient Medications (Analgesics):  .  aspirin 81 MG tablet, Take 81 mg by mouth daily. Marland Kitchen  oxyCODONE (OXY IR/ROXICODONE) 5 MG immediate release tablet, Take 1 tablet (5 mg total) by mouth every 8 (eight) hours as needed for severe pain. Must last 30 days. Marland Kitchen  oxyCODONE (OXY IR/ROXICODONE) 5 MG immediate release tablet, Take 1 tablet (5 mg total) by mouth every 8 (eight) hours as needed for severe pain. Must last 30 days. Marland Kitchen  oxyCODONE (OXY IR/ROXICODONE) 5 MG immediate release tablet, Take 1 tablet (5 mg total) by mouth every 8 (eight) hours as needed for severe pain. Must last 30 days.   Current Outpatient Medications (Other):  Marland Kitchen  ACCU-CHEK AVIVA PLUS test strip, TEST 1 TIME DAILY **E11.9** .  ACCU-CHEK SOFTCLIX LANCETS lancets, USE TO CHECK BLOOD SUGAR 1 TIME DAILY. DX: E11.42 .  AMBULATORY NON FORMULARY MEDICATION, Medication Name: CPAP MASK OF CHOICE FOR HOME DEVICE .  buPROPion (WELLBUTRIN XL) 150 MG 24 hr tablet, Take 1 tablet (150 mg total) by mouth daily. .  Cinnamon 500 MG TABS, Take 1,000 mg by mouth daily. .  Coenzyme Q10 (CO Q 10) 100 MG CAPS, Take  100 mg by mouth daily.  Marland Kitchen  gentamicin ointment (GARAMYCIN) 0.1 %, APPLY TO NOSE THREE TIMES DAILY .  glucosamine-chondroitin 500-400 MG tablet, Take 2 tablets by mouth daily.  .  Magnesium 250 MG TABS, Take 250 mg by mouth daily. .  Misc Natural Products (TART CHERRY ADVANCED PO), Take 1,200 mg by mouth at bedtime.  .  Multiple Vitamin (MULTIVITAMIN) tablet, Take 1 tablet by mouth daily. .  naloxone (NARCAN) 2 MG/2ML injection, Inject 1 mL (1 mg total) into the muscle as needed for up to 2 doses (for opioid overdose). Inject content of syringe into thigh muscle. Call 911. .  NONFORMULARY OR COMPOUNDED ITEM, 147.95 mcg by Epidural Infusion route Continuous EPIDURAL. Medtronic Neuromodulation pump Fentanyl 147.95 mcg, Baclofen, bupivicaine .  NONFORMULARY OR COMPOUNDED ITEM, 88.77 mcg by Epidural Infusion route Continuous EPIDURAL. Medtronic Neuromodulation pump: Fentanyl, baclofen 88.77, bupivicane .  NONFORMULARY OR COMPOUNDED ITEM, 5.918 mg by Epidural Infusion route Continuous EPIDURAL. Medtronic Neuromodulation pump: Fentanyl, Baclofen, Bupivicaine 5.918 mg .  Omega-3 Fatty Acids (FISH OIL) 1000 MG CAPS, Take 2,000 mg by mouth 2 (two) times daily.  Marland Kitchen  PARoxetine (PAXIL) 20 MG tablet, TAKE ONE-HALF OF A TABLET BY MOUTH DAILY IN THE MORNING .  polyethylene glycol (MIRALAX / GLYCOLAX) packet, Take 17 g by mouth daily.  .  pregabalin (LYRICA) 150 MG capsule, Take 1 capsule (150 mg total) by mouth 3 (three) times daily. Marland Kitchen  tiZANidine (ZANAFLEX) 4 MG tablet, Take 1 tablet (4 mg total) by mouth 3 (three) times daily. .  Turmeric 500 MG TABS, Take 500 mg by mouth 2 (two) times daily.  Marland Kitchen  VITAMIN E PO, Take 450 Units by mouth daily.     Past medical history, social, surgical and family history all reviewed in electronic medical record.  [name] pertanent information unless stated regarding to the chief complaint.   Review of Systems:  [name] headache, visual changes, nausea, vomiting, diarrhea, constipation,  dizziness,  abdominal pain, skin rash, fevers, chills, night sweats, weight loss, swollen lymph nodes, chest pain, shortness of breath, mood changes.  Positive muscle aches, joint swelling, body aches  Objective  Blood pressure (!) 110/54, pulse 74, height 5\' 10"  (1.778 m), weight 217 lb (98.4 kg), SpO2 96 %.    General: [name] apparent distress alert and oriented x3 mood and affect normal, dressed appropriately.  HEENT: Pupils equal, extraocular movements intact  Respiratory: Patient's speak in full sentences and does not appear short of breath  Cardiovascular: 1+ lower extremity edema, non tender, [name] erythema  Skin: Warm dry intact with [name] signs of infection or rash on extremities or on axial skeleton.  Abdomen: Soft nontender mildly distended Neuro: Cranial nerves II through XII are intact, neurovascularly intact in all extremities with 2+ DTRs and 2+ pulses.  Lymph: [name] lymphadenopathy of posterior or anterior cervical chain or axillae bilaterally.  Gait antalgic favoring right knee MSK:  tender with limited range of motion and stability and symmetric strength and tone of shoulders, elbows, wrist, ,and ankles bilaterally.  Knee: Right valgus deformity noted.  Abnormal thigh to calf ratio.  Tender to palpation over medial and PF joint line.  ROM full in flexion and extension and lower leg rotation. instability with valgus force.  painful patellar compression. Patellar glide with moderate crepitus. Patellar and quadriceps tendons unremarkable. Hamstring and quadriceps strength is normal.  After informed written and verbal consent, patient was seated on exam table. Right knee was prepped with alcohol swab and utilizing anterolateral approach, patient's right knee space was injected with 4:1  marcaine 0.5%: Kenalog 40mg /dL.  Patient noted to have aspiration of 55 cc of straw-colored fluid patient tolerated the procedure well without immediate complications.    Impression and Recommendations:      This case required medical decision making of moderate complexity. The above documentation has been reviewed and is accurate and complete Lyndal Pulley, DO       Note: This dictation was prepared with Dragon dictation along with smaller phrase technology. Any transcriptional errors that result from this process are unintentional.

## 2019-04-17 ENCOUNTER — Ambulatory Visit (INDEPENDENT_AMBULATORY_CARE_PROVIDER_SITE_OTHER): Payer: Medicare Other | Admitting: Family Medicine

## 2019-04-17 ENCOUNTER — Other Ambulatory Visit: Payer: Medicare Other

## 2019-04-17 ENCOUNTER — Encounter: Payer: Self-pay | Admitting: Family Medicine

## 2019-04-17 VITALS — BP 110/54 | HR 74 | Ht 70.0 in | Wt 217.0 lb

## 2019-04-17 DIAGNOSIS — M25561 Pain in right knee: Secondary | ICD-10-CM | POA: Diagnosis not present

## 2019-04-17 DIAGNOSIS — G8929 Other chronic pain: Secondary | ICD-10-CM

## 2019-04-17 DIAGNOSIS — I6523 Occlusion and stenosis of bilateral carotid arteries: Secondary | ICD-10-CM

## 2019-04-17 DIAGNOSIS — M170 Bilateral primary osteoarthritis of knee: Secondary | ICD-10-CM | POA: Diagnosis not present

## 2019-04-17 NOTE — Assessment & Plan Note (Signed)
Patient was given injection today.  Tolerated the procedure well.  Discussed icing regimen and home exercise.  Discussed which activities to do which wants to avoid.  Follow-up again in 4 to 8 weeks

## 2019-04-17 NOTE — Patient Instructions (Signed)
Wear the brace regularly  See me again in 4 weeks

## 2019-04-18 LAB — SYNOVIAL CELL COUNT + DIFF, W/ CRYSTALS
Basophils, %: 0 %
Eosinophils-Synovial: 0 % (ref 0–2)
Lymphocytes-Synovial Fld: 24 % (ref 0–74)
Monocyte/Macrophage: 46 % (ref 0–69)
Neutrophil, Synovial: 3 % (ref 0–24)
Synoviocytes, %: 27 % — ABNORMAL HIGH (ref 0–15)
WBC, Synovial: 403 cells/uL — ABNORMAL HIGH (ref <150)

## 2019-04-18 LAB — TIQ-NTM

## 2019-04-20 DIAGNOSIS — Z9689 Presence of other specified functional implants: Secondary | ICD-10-CM | POA: Diagnosis not present

## 2019-04-20 DIAGNOSIS — T888XXD Other specified complications of surgical and medical care, not elsewhere classified, subsequent encounter: Secondary | ICD-10-CM | POA: Diagnosis not present

## 2019-04-22 ENCOUNTER — Ambulatory Visit (HOSPITAL_BASED_OUTPATIENT_CLINIC_OR_DEPARTMENT_OTHER): Payer: Medicare Other

## 2019-04-22 ENCOUNTER — Other Ambulatory Visit: Payer: Self-pay

## 2019-04-22 DIAGNOSIS — I2583 Coronary atherosclerosis due to lipid rich plaque: Secondary | ICD-10-CM | POA: Diagnosis not present

## 2019-04-22 DIAGNOSIS — I6523 Occlusion and stenosis of bilateral carotid arteries: Secondary | ICD-10-CM | POA: Diagnosis not present

## 2019-04-22 DIAGNOSIS — I2510 Atherosclerotic heart disease of native coronary artery without angina pectoris: Secondary | ICD-10-CM | POA: Diagnosis not present

## 2019-04-23 ENCOUNTER — Ambulatory Visit (HOSPITAL_COMMUNITY)
Admission: RE | Admit: 2019-04-23 | Discharge: 2019-04-23 | Disposition: A | Discharge status: Home / Self Care | Payer: Medicare Other | Source: Ambulatory Visit | Attending: Cardiology | Admitting: Cardiology

## 2019-04-23 DIAGNOSIS — I2510 Atherosclerotic heart disease of native coronary artery without angina pectoris: Secondary | ICD-10-CM | POA: Insufficient documentation

## 2019-04-23 DIAGNOSIS — I6523 Occlusion and stenosis of bilateral carotid arteries: Secondary | ICD-10-CM | POA: Diagnosis not present

## 2019-04-23 DIAGNOSIS — I2583 Coronary atherosclerosis due to lipid rich plaque: Secondary | ICD-10-CM | POA: Insufficient documentation

## 2019-04-24 ENCOUNTER — Other Ambulatory Visit: Payer: Self-pay | Admitting: Pain Medicine

## 2019-04-24 ENCOUNTER — Telehealth: Payer: Self-pay | Admitting: *Deleted

## 2019-04-24 DIAGNOSIS — I6523 Occlusion and stenosis of bilateral carotid arteries: Secondary | ICD-10-CM

## 2019-04-24 DIAGNOSIS — M7918 Myalgia, other site: Secondary | ICD-10-CM

## 2019-04-24 DIAGNOSIS — M5441 Lumbago with sciatica, right side: Secondary | ICD-10-CM

## 2019-04-24 DIAGNOSIS — G8929 Other chronic pain: Secondary | ICD-10-CM

## 2019-04-24 NOTE — Telephone Encounter (Signed)
Pt has been notified of carotid results by phone with verbal understanding. Pt is agreeable to repeat carotids in 1 yr. Keep appt 05/26/19 @ 1:20 pm with Dr. Marlou Porch. Pt thanked me for the call. Results have been released to [place]. Patient notified of result.  Please refer to phone note from today for complete details.   Julaine Hua, [place] 04/24/2019 9:04 AM

## 2019-04-28 DIAGNOSIS — E119 Type 2 diabetes mellitus without complications: Secondary | ICD-10-CM | POA: Diagnosis not present

## 2019-05-05 ENCOUNTER — Other Ambulatory Visit: Payer: Self-pay | Admitting: Pain Medicine

## 2019-05-05 ENCOUNTER — Encounter: Payer: Self-pay | Admitting: Pain Medicine

## 2019-05-05 DIAGNOSIS — M797 Fibromyalgia: Secondary | ICD-10-CM

## 2019-05-06 ENCOUNTER — Other Ambulatory Visit: Payer: Self-pay

## 2019-05-06 ENCOUNTER — Telehealth: Payer: Self-pay | Admitting: Pulmonary Disease

## 2019-05-06 ENCOUNTER — Ambulatory Visit: Payer: Medicare Other | Attending: Pain Medicine | Admitting: Pain Medicine

## 2019-05-06 ENCOUNTER — Encounter: Payer: Self-pay | Admitting: Pain Medicine

## 2019-05-06 DIAGNOSIS — G8929 Other chronic pain: Secondary | ICD-10-CM

## 2019-05-06 DIAGNOSIS — G894 Chronic pain syndrome: Secondary | ICD-10-CM

## 2019-05-06 DIAGNOSIS — M961 Postlaminectomy syndrome, not elsewhere classified: Secondary | ICD-10-CM

## 2019-05-06 DIAGNOSIS — M7918 Myalgia, other site: Secondary | ICD-10-CM

## 2019-05-06 DIAGNOSIS — M797 Fibromyalgia: Secondary | ICD-10-CM

## 2019-05-06 DIAGNOSIS — M47816 Spondylosis without myelopathy or radiculopathy, lumbar region: Secondary | ICD-10-CM

## 2019-05-06 DIAGNOSIS — G4733 Obstructive sleep apnea (adult) (pediatric): Secondary | ICD-10-CM

## 2019-05-06 MED ORDER — PREGABALIN 150 MG PO CAPS: 150.0000 mg | ORAL_CAPSULE | Freq: Three times a day (TID) | ORAL | 1 refills | Status: DC

## 2019-05-06 MED ORDER — OXYCODONE HCL 5 MG PO TABS: 5.0000 mg | ORAL_TABLET | Freq: Three times a day (TID) | ORAL | 0 refills | Status: DC | PRN

## 2019-05-06 MED ORDER — PAIN MANAGEMENT IT PUMP REFILL: 1.0000 | Freq: Once | INTRATHECAL | 0 refills | Status: AC

## 2019-05-06 MED ORDER — TIZANIDINE HCL 4 MG PO TABS: 4.0000 mg | ORAL_TABLET | Freq: Three times a day (TID) | ORAL | 1 refills | Status: DC

## 2019-05-06 NOTE — Patient Instructions (Signed)
____________________________________________________________________________________________  Preparing for Procedure with Sedation  Procedure appointments are limited to planned procedures: . No Prescription Refills. . No disability issues will be discussed. . No medication changes will be discussed.  Instructions: . Oral Intake: Do not eat or drink anything for at least 8 hours prior to your procedure. . Transportation: Public transportation is not allowed. Bring an adult driver. The driver must be physically present in our waiting room before any procedure can be started. . Physical Assistance: Bring an adult physically capable of assisting you, in the event you need help. This adult should keep you company at home for at least 6 hours after the procedure. . Blood Pressure Medicine: Take your blood pressure medicine with a sip of water the morning of the procedure. . Blood thinners: Notify our staff if you are taking any blood thinners. Depending on which one you take, there will be specific instructions on how and when to stop it. . Diabetics on insulin: Notify the staff so that you can be scheduled 1st case in the morning. If your diabetes requires high dose insulin, take only  of your normal insulin dose the morning of the procedure and notify the staff that you have done so. . Preventing infections: Shower with an antibacterial soap the morning of your procedure. . Build-up your immune system: Take 1000 mg of Vitamin C with every meal (3 times a day) the day prior to your procedure. . Antibiotics: Inform the staff if you have a condition or reason that requires you to take antibiotics before dental procedures. . Pregnancy: If you are pregnant, call and cancel the procedure. . Sickness: If you have a cold, fever, or any active infections, call and cancel the procedure. . Arrival: You must be in the facility at least 30 minutes prior to your scheduled procedure. . Children: Do not bring  children with you. . Dress appropriately: Bring dark clothing that you would not mind if they get stained. . Valuables: Do not bring any jewelry or valuables.  Reasons to call and reschedule or cancel your procedure: (Following these recommendations will minimize the risk of a serious complication.) . Surgeries: Avoid having procedures within 2 weeks of any surgery. (Avoid for 2 weeks before or after any surgery). . Flu Shots: Avoid having procedures within 2 weeks of a flu shots or . (Avoid for 2 weeks before or after immunizations). . Barium: Avoid having a procedure within 7-10 days after having had a radiological study involving the use of radiological contrast. (Myelograms, Barium swallow or enema study). . Heart attacks: Avoid any elective procedures or surgeries for the initial 6 months after a "Myocardial Infarction" (Heart Attack). . Blood thinners: It is imperative that you stop these medications before procedures. Let us know if you if you take any blood thinner.  . Infection: Avoid procedures during or within two weeks of an infection (including chest colds or gastrointestinal problems). Symptoms associated with infections include: Localized redness, fever, chills, night sweats or profuse sweating, burning sensation when voiding, cough, congestion, stuffiness, runny nose, sore throat, diarrhea, nausea, vomiting, cold or Flu symptoms, recent or current infections. It is specially important if the infection is over the area that we intend to treat. . Heart and lung problems: Symptoms that may suggest an active cardiopulmonary problem include: cough, chest pain, breathing difficulties or shortness of breath, dizziness, ankle swelling, uncontrolled high or unusually low blood pressure, and/or palpitations. If you are experiencing any of these symptoms, cancel your procedure and contact   your primary care physician for an evaluation.  Remember:  Regular Business hours are:  Monday to Thursday  8:00 AM to 4:00 PM  Provider's Schedule: Ceairra Mccarver, MD:  Procedure days: Tuesday and Thursday 7:30 AM to 4:00 PM  Bilal Lateef, MD:  Procedure days: Monday and Wednesday 7:30 AM to 4:00 PM ____________________________________________________________________________________________   ____________________________________________________________________________________________  Muscle Spasms & Cramps  Cause:  The most common cause of muscle spasms and cramps is vitamin and/or electrolyte (calcium, potassium, sodium, etc.) deficiencies.  Possible triggers: Sweating - causes loss of electrolytes thru the skin. Steroids - causes loss of electrolytes thru the urine.  Treatment: 1. Gatorade (or any other electrolyte-replenishing drink) - Take 1, 8 oz glass with each meal (3 times a day). 2. OTC (over-the-counter) Magnesium 400 to 500 mg - Take 1 tablet twice a day (one with breakfast and one before bedtime). If you have kidney problems, talk to your primary care physician before taking any Magnesium. 3. Tonic Water with quinine - Take 1, 8 oz glass before bedtime.   ____________________________________________________________________________________________    

## 2019-05-06 NOTE — Telephone Encounter (Signed)
Titration study has been reviewed. Auto CPAP settings 12 to 10 cm seems to work for her, her current settings are about the same. She did have residual events on higher pressures which may be related to her narcotic dosage. Her prior download on auto settings in 2019 have shown good control Suggest office visit in next 4 to 6 weeks with provider at [facility] and review download

## 2019-05-06 NOTE — Progress Notes (Signed)
Pain Management Virtual Encounter Note - Virtual Visit via Telephone Telehealth (real-time audio visits between healthcare provider and patient).   Patient's Phone No. & Preferred Pharmacy:  [phone] (home); [phone] (mobile); (Preferred) [phone] [email or web address]  [facility], [place] [address] [place] [address] Phone: [phone] Fax: [phone]    Pre-screening note:  Our staff contacted Melinda [name] and offered her an "in person", "face-to-face" appointment versus a telephone encounter. She indicated preferring the telephone encounter, at this time.   Reason for Virtual Visit: COVID-19*  Social distancing based on CDC and AMA recommendations.   I contacted Melinda [name] on 05/06/2019 via telephone.      I clearly identified myself as Gaspar Cola, MD. I verified that I was speaking with the correct person using two identifiers (Name: Melinda [name], and date of birth: 1956-04-28).  Advanced Informed Consent I sought verbal advanced consent from Melinda [name] for virtual visit interactions. I informed Melinda [name] of possible security and privacy concerns, risks, and limitations associated with providing "not-in-person" medical evaluation and management services. I also informed Melinda [name] of the availability of "in-person" appointments. Finally, I informed her that there would be a charge for the virtual visit and that she could be  personally, fully or partially, financially responsible for it. Melinda [name] expressed understanding and agreed to proceed.   Historic Elements   Melinda [name] is a 63 y.o. year old, female patient evaluated today after her last encounter by our practice on 05/05/2019. Melinda [name]  has a past medical history of Amputation of great toe, right, traumatic ([place]) (05/30/2010), Amputation of second toe, right, traumatic ([place]) (09/2017), Anemia, Anxiety, Blue toes, Bulging disc, CAD  (coronary artery disease) (2009), Cardiac arrhythmia due to congenital heart disease, Chicken pox, Chronic fatigue, Chronic kidney disease, Coronary arteritis, Degenerative disc disease, Depression, Diabetes mellitus without complication ([place]), Facet joint disease, Fibromyalgia, Heart disease, Hyperlipidemia, IBS (irritable bowel syndrome), MCL deficiency, knee, MRSA (methicillin resistant staph aureus) culture positive (2011), Neuropathy (04/18/2010), Peripheral neuropathy, Renal insufficiency, Restless leg syndrome, Sepsis ([place]) (09/2013), Sleep apnea (08/17/2003), and Spinal stenosis. She also  has a past surgical history that includes Foot surgery (Right); Hand surgery (Left); Gallbladder surgery; Ablation; Ablation; HEART STENT (2009); HAND SURGEY (Left); Foot surgery (Bilateral); Foot surgery (Right); Infusion pump implantation; Back surgery; Cholecystectomy (2003); Anterior cervical decomp/discectomy fusion (N/A, 07/28/2014); Carpal tunnel release (Right); Eye surgery (Bilateral, 2013); Amputation toe (Right, 02/01/2017); Irrigation and debridement foot (Right, 02/23/2017); Toe Surgery; Hammer toe surgery (Right, 10/17/2017); Intrathecal pump revision (N/A, 04/25/2018); Intrathecal pump revision (Right, 04/25/2018); Coronary angioplasty (2008); Pain pump revision (N/A, 08/15/2018); and Intrathecal pump revision (N/A, 12/12/2018). Melinda [name] has a current medication list which includes the following prescription(s): accu-chek aviva plus, accu-chek softclix lancets, AMBULATORY NON FORMULARY MEDICATION, aspirin, bupropion, cinnamon, co q 10, fluticasone, gentamicin ointment, glucosamine-chondroitin, magnesium, misc natural products, multivitamin, naloxone, nitroglycerin, NONFORMULARY OR COMPOUNDED ITEM, NONFORMULARY OR COMPOUNDED ITEM, NONFORMULARY OR COMPOUNDED ITEM, fish oil, paroxetine, polyethylene glycol, rosuvastatin, turmeric, vitamin e, oxycodone, oxycodone, oxycodone, pregabalin, and tizanidine. She  reports  that she quit smoking about 11 years ago. Her smoking use included cigarettes. She has a 48.00 pack-year smoking history. She has never used smokeless tobacco. She reports current alcohol use. She reports that she does not use drugs. Melinda [name] has No Known Allergies.   HPI  Today, she is being contacted for medication management.  The patient indicates doing well with the current medication regimen. No adverse reactions  or side effects reported to the medications.   She indicates recently having more pain in the left lower back with acute spasms when she bends up to do things.  It has been a while since we treated her lower back pain for this.  Pharmacotherapy Assessment  Analgesic: Oxycodone 5 mg, 1 tab PO q 8 hrs (15 mg/day of oxycodone) + Intrathecal PF-Fentanyl MME/day: 22.5 mg/day (oral).   Monitoring: Pharmacotherapy: No side-effects or adverse reactions reported. [place] PMP: PDMP reviewed during this encounter.       Compliance: No problems identified. Effectiveness: Clinically acceptable. Plan: Refer to "POC".  UDS:  Summary  Date Value Ref Range Status  07/24/2018 FINAL  Final    Comment:    ==================================================================== TOXASSURE SELECT 13 (MW) ==================================================================== Test                             Result       Flag       Units Drug Present and Declared for Prescription Verification   Oxycodone                      754          EXPECTED   ng/mg creat   Oxymorphone                    231          EXPECTED   ng/mg creat   Noroxycodone                   2463         EXPECTED   ng/mg creat   Noroxymorphone                 89           EXPECTED   ng/mg creat    Sources of oxycodone are scheduled prescription medications.    Oxymorphone, noroxycodone, and noroxymorphone are expected    metabolites of oxycodone. Oxymorphone is also available as a    scheduled prescription medication. Drug  Present not Declared for Prescription Verification   Fentanyl                       4            UNEXPECTED ng/mg creat   Norfentanyl                    29           UNEXPECTED ng/mg creat    Source of fentanyl is a scheduled prescription medication,    including IV, patch, and transmucosal formulations. Norfentanyl    is an expected metabolite of fentanyl. ==================================================================== Test                      Result    Flag   Units      Ref Range   Creatinine              134              mg/dL      >=20 ==================================================================== Declared Medications:  The flagging and interpretation on this report are based on the  following declared medications.  Unexpected results may arise from  inaccuracies in the declared medications.  **Note: The testing scope of this panel includes these medications:  Oxycodone  **  Note: The testing scope of this panel does not include following  reported medications:  Aspirin (Aspirin 81)  Bupropion (Wellbutrin)  Chondroitin (Glucosamine-Chondroitin)  Cinnamon  Glipizide (Glucotrol)  Glucosamine (Glucosamine-Chondroitin)  Magnesium  Multivitamin  Naloxone  Nitroglycerin (Nitrostat)  Paroxetine  Polyethylene Glycol (GlycoLAX)  Polyethylene Glycol (MiraLAX)  Pregabalin (Lyrica)  Rosuvastatin (Crestor)  Supplement (Omega-3)  Tizanidine (Zanaflex)  Turmeric  Ubiquinone (Coenzyme Q 10)  Vitamin D2 (Drisdol)  Vitamin E ==================================================================== For clinical consultation, please call [phone]. ====================================================================    Laboratory Chemistry Profile (12 mo)  Renal: 03/12/2019: BUN 24; Creatinine, Ser 1.08  Lab Results  Component Value Date   GFR 51.49 (L) 03/12/2019   GFRAA >60 12/10/2018   GFRNONAA 58 (L) 12/10/2018   Hepatic: 03/12/2019: Albumin 4.3 Lab Results   Component Value Date   AST 20 03/12/2019   ALT 18 03/12/2019   Other: 03/12/2019: Vitamin B-12 253; VITD 34.99 Note: Above Lab results reviewed.  Imaging  Last 90 days:  Ct Abdomen Pelvis W Contrast  Result Date: 04/17/2019 CLINICAL DATA:  Abdominal swelling.  Ascites suspected. EXAM: CT ABDOMEN AND PELVIS WITH CONTRAST TECHNIQUE: Multidetector CT imaging of the abdomen and pelvis was performed using the standard protocol following bolus administration of intravenous contrast. CONTRAST:  175mL OMNIPAQUE IOHEXOL 300 MG/ML  SOLN COMPARISON:  None. FINDINGS: Lower chest: Unremarkable Hepatobiliary: No suspicious focal abnormality within the liver parenchyma. Gallbladder surgically absent. No intrahepatic or extrahepatic biliary dilation. Pancreas: No focal mass lesion. No dilatation of the main duct. No intraparenchymal cyst. No peripancreatic edema. Spleen: No splenomegaly. No focal mass lesion. Adrenals/Urinary Tract: No adrenal nodule or mass. Kidneys unremarkable. No evidence for hydroureter. The urinary bladder appears normal for the degree of distention. Stomach/Bowel: Stomach is unremarkable. No gastric wall thickening. No evidence of outlet obstruction. Duodenum is normally positioned as is the ligament of Treitz. No small bowel wall thickening. No small bowel dilatation. The terminal ileum is normal. The appendix is not visualized, but there is no edema or inflammation in the region of the cecum. No gross colonic mass. No colonic wall thickening. Vascular/Lymphatic: There is abdominal aortic atherosclerosis without aneurysm. There is no gastrohepatic or hepatoduodenal ligament lymphadenopathy. No intraperitoneal or retroperitoneal lymphadenopathy. No pelvic sidewall lymphadenopathy. Reproductive: The uterus is unremarkable.  There is no adnexal mass. Other: No intraperitoneal free fluid. Musculoskeletal: Intrathecal pump identified in the subcutaneous tissues of the right abdominal wall. There is  a prominent fluid collection around the pump device with a thin well organized rim. No worrisome lytic or sclerotic osseous abnormality. IMPRESSION: 1. No acute intracranial abnormality. No findings to explain the reported history of abdominal swelling. 2. Prominent fluid collection identified around the intrathecal pump device in the lower right anterior abdominal wall. There is no gas or substantial rim enhancement associated with this fluid, but superinfection cannot be excluded by CT. 3.  Aortic Atherosclerois (ICD10-170.0) Electronically Signed   By: Misty Stanley M.D.   On: 04/17/2019 07:36   Vas US Carotid  Result Date: 04/23/2019 Carotid Arterial Duplex Study Indications:       Bilateral carotid artery stenosis. Patient reports having                    bilateral neck pain that starts below the clavicles and                    radiates up on both sides of the neck x 2 months. She has  also experienced dizziness x 2 months, and when this occurs                    she notices a drop in her blood pressure. Numbness, pain and                    cold left hand when in certain positions. She denies any                    other cerebrovascular symptoms. Risk Factors:      Diabetes, past history of smoking, coronary artery disease. Comparison Study:  In 06/2015, a carotid duplex showed velocities of 177/50 cm/s                    in the RICA and 833/82 cm/s in the LICA. Performing Technologist: Sharlett Iles RVT  Examination Guidelines: A complete evaluation includes B-mode imaging, spectral Doppler, color Doppler, and power Doppler as needed of all accessible portions of each vessel. Bilateral testing is considered an integral part of a complete examination. Limited examinations for reoccurring indications may be performed as noted.  Right Carotid Findings: +----------+--------+--------+--------+--------------------+-------------------+           PSV cm/sEDV cm/sStenosisPlaque  Description  Comments            +----------+--------+--------+--------+--------------------+-------------------+ CCA Prox  88      20                                                      +----------+--------+--------+--------+--------------------+-------------------+ CCA Distal71      23              heterogenous                            +----------+--------+--------+--------+--------------------+-------------------+ ICA Prox  167     36      40-59%  heterogenous and    based on peak                                         irregular           systolic velocities                                                       and plaque                                                                formation           +----------+--------+--------+--------+--------------------+-------------------+ ICA Mid   63      24                                                      +----------+--------+--------+--------+--------------------+-------------------+  ICA Distal76      27                                                      +----------+--------+--------+--------+--------------------+-------------------+ ECA       187     34      >50%    heterogenous                            +----------+--------+--------+--------+--------------------+-------------------+ +----------+--------+-------+----------------+-------------------+           PSV cm/sEDV cmsDescribe        Arm Pressure (mmHG) +----------+--------+-------+----------------+-------------------+ Subclavian105            Multiphasic, [id number]                 +----------+--------+-------+----------------+-------------------+ +---------+--------+--+--------+--+---------+ VertebralPSV cm/s42EDV cm/s14Antegrade +---------+--------+--+--------+--+---------+  Left Carotid Findings: +----------+--------+--------+--------+------------------+---------------------+           PSV cm/sEDV  cm/sStenosisPlaque DescriptionComments              +----------+--------+--------+--------+------------------+---------------------+ CCA Prox  62      14                                tortuous              +----------+--------+--------+--------+------------------+---------------------+ CCA Distal66      18                                                      +----------+--------+--------+--------+------------------+---------------------+ ICA Prox  75      23      1-39%   heterogenous      decrease in                                                               velocities, now                                                           within normal range   +----------+--------+--------+--------+------------------+---------------------+ ICA Mid   65      24                                                      +----------+--------+--------+--------+------------------+---------------------+ ICA Distal83      35                                                      +----------+--------+--------+--------+------------------+---------------------+  ECA       123     19              heterogenous                            +----------+--------+--------+--------+------------------+---------------------+ +----------+--------+--------+---------+-------------------+           PSV cm/sEDV cm/sDescribe Arm Pressure (mmHG) +----------+--------+--------+---------+-------------------+ Subclavian221             Turbulent142                 +----------+--------+--------+---------+-------------------+ +---------+--------+--+--------+--+---------+ VertebralPSV cm/s32EDV cm/s13Antegrade +---------+--------+--+--------+--+---------+  Summary: Right Carotid: Velocities in the right ICA are consistent with a 40-59%                stenosis. Non-hemodynamically significant plaque <50% noted in                the CCA. The RICA velocities are elevated and stable  compared to                the prior exam. Left Carotid: Velocities in the left ICA are consistent with a 1-39% stenosis.               The LICA velocities have decreased compared to the prior exam, and               are now within normal range. Vertebrals:  Bilateral vertebral arteries demonstrate antegrade flow. Subclavians: Left subclavian artery flow was disturbed. Normal flow hemodynamics              were seen in the right subclavian artery. *See table(s) above for measurements and observations. Suggest follow up study in 12 months. Electronically signed by Larae Grooms MD on 04/23/2019 at 6:19:21 PM.    Final     Assessment  The primary encounter diagnosis was Lumbar facet joint syndrome (Left). Diagnoses of Lumbar facet hypertrophy (Multilevel), Chronic low back pain (Bilateral) (L>R), Failed back surgical syndrome, Chronic pain syndrome, Chronic musculoskeletal pain, and Fibromyalgia were also pertinent to this visit.  Plan of Care  I am having Melinda [name] start on oxyCODONE and oxyCODONE. I am also having her maintain her polyethylene glycol, aspirin, AMBULATORY NON FORMULARY MEDICATION, Fish Oil, VITAMIN E PO, glucosamine-chondroitin, multivitamin, Co Q 10, NONFORMULARY OR COMPOUNDED ITEM, NONFORMULARY OR COMPOUNDED ITEM, NONFORMULARY OR COMPOUNDED ITEM, Turmeric, Misc Natural Products (TART CHERRY ADVANCED PO), Magnesium, Cinnamon, Accu-Chek Aviva Plus, naloxone, Accu-Chek Softclix Lancets, PARoxetine, buPROPion, nitroGLYCERIN, rosuvastatin, fluticasone, gentamicin ointment, tiZANidine, oxyCODONE, and pregabalin.  Pharmacotherapy (Medications Ordered): Meds ordered this encounter  Medications  . tiZANidine (ZANAFLEX) 4 MG tablet    Sig: Take 1 tablet (4 mg total) by mouth 3 (three) times daily.    Dispense:  270 tablet    Refill:  1    Fill one day early if pharmacy is closed on scheduled refill date. May substitute for generic if available.  Marland Kitchen oxyCODONE (OXY IR/ROXICODONE) 5 MG  immediate release tablet    Sig: Take 1 tablet (5 mg total) by mouth every 8 (eight) hours as needed for severe pain. Must last 30 days.    Dispense:  90 tablet    Refill:  0    Chronic Pain: STOP Act (Not applicable) Fill 1 day early if closed on refill date. Do not fill until: 05/06/2019. To last until: 06/05/2019. Avoid benzodiazepines within 8 hours of opioids  . pregabalin (LYRICA) 150 MG capsule    Sig:  Take 1 capsule (150 mg total) by mouth 3 (three) times daily.    Dispense:  270 capsule    Refill:  1    Fill one day early if pharmacy is closed on scheduled refill date. May substitute for generic if available.  Marland Kitchen oxyCODONE (OXY IR/ROXICODONE) 5 MG immediate release tablet    Sig: Take 1 tablet (5 mg total) by mouth every 8 (eight) hours as needed for severe pain. Must last 30 days.    Dispense:  90 tablet    Refill:  0    Chronic Pain: STOP Act (Not applicable) Fill 1 day early if closed on refill date. Do not fill until: 06/05/2019. To last until: 07/05/2019. Avoid benzodiazepines within 8 hours of opioids  . oxyCODONE (OXY IR/ROXICODONE) 5 MG immediate release tablet    Sig: Take 1 tablet (5 mg total) by mouth every 8 (eight) hours as needed for severe pain. Must last 30 days.    Dispense:  90 tablet    Refill:  0    Chronic Pain: STOP Act (Not applicable) Fill 1 day early if closed on refill date. Do not fill until: 07/05/2019. To last until: 08/04/2019. Avoid benzodiazepines within 8 hours of opioids   Orders:  Orders Placed This Encounter  Procedures  . LUMBAR FACET(MEDIAL BRANCH NERVE BLOCK) MBNB    Standing Status:   Future    Standing Expiration Date:   06/05/2019    Scheduling Instructions:     Procedure: Lumbar facet block (AKA.: Lumbosacral medial branch nerve block)     Side: Left-sided     Level: L3-4, L4-5, & L5-S1 Facets (L2, L3, L4, L5, & S1 Medial Branch Nerves)     Sedation: With Sedation.     Timeframe: ASAA    Order Specific Question:   Where will this procedure be  performed?    Answer:   [facility]   Follow-up plan:   Return in about 3 months (around 08/03/2019) for (VV), (MM), in addition, Procedure (w/ sedation): (L) L-FCT BLK, (ASAP).      Interventional treatment options:  Under consideration: Diagnostic left lumbar facet block #1  Possible left lumbar facet RFA  Diagnostic caudal ESI + diagnostic epidurogram Possible Racz procedure Diagnostic bilateral IA knee injection (Steroid) Diagnostic bilateral genicular NB Possible bilateral genicular nerve RFA Possible bilateral Hyalgan knee injection DiagnosticcervicalESI Diagnostic bilateral cervical facet block Possible bilateral cervical facet RFA   Therapeutic/palliative (PRN): Palliative/therapeutic intrathecal pump management (refills/programming adjustments)  Palliative left L2-3 LESI #2    Recent Visits Date Type Provider Dept  02/18/19 Procedure visit [name], MD [facility]  Showing recent visits within past 90 days and meeting all other requirements   Today's Visits Date Type Provider Dept  05/06/19 Telemedicine [name], MD [facility]  Showing today's visits and meeting all other requirements   Future Appointments Date Type Provider Dept  06/03/19 Appointment [name], MD [facility]  Showing future appointments within next 90 days and meeting all other requirements   I discussed the assessment and treatment plan with the patient. The patient was provided an opportunity to ask questions and all were answered. The patient agreed with the plan and demonstrated an understanding of the instructions.  Patient advised to call back or seek an in-person evaluation if the symptoms or condition worsens.  Total duration of non-face-to-face encounter: 15 minutes.  Note by: Gaspar Cola, MD Date: 05/06/2019; Time: 2:01 PM  Note: This dictation was prepared  with Sales executive. Any  transcriptional errors that may result from this process are unintentional.  Disclaimer:  * Given the special circumstances of the COVID-19 pandemic, the federal government has announced that the Office for Civil Rights (OCR) will exercise its enforcement discretion and will not impose penalties on physicians using telehealth in the event of noncompliance with regulatory requirements under the [place] (HIPAA) in connection with the good faith provision of telehealth during the TGRMB-01 national public health emergency. ([place])

## 2019-05-12 DIAGNOSIS — H02831 Dermatochalasis of right upper eyelid: Secondary | ICD-10-CM | POA: Diagnosis not present

## 2019-05-12 DIAGNOSIS — E139 Other specified diabetes mellitus without complications: Secondary | ICD-10-CM | POA: Insufficient documentation

## 2019-05-12 DIAGNOSIS — H02403 Unspecified ptosis of bilateral eyelids: Secondary | ICD-10-CM | POA: Diagnosis not present

## 2019-05-12 DIAGNOSIS — H02834 Dermatochalasis of left upper eyelid: Secondary | ICD-10-CM | POA: Diagnosis not present

## 2019-05-12 NOTE — Telephone Encounter (Signed)
Called the patient and made her aware of the results. She voiced understanding. She was in the car driving was not able to access calendar to make appointment. Stated she will call the [place] office to set up. Nothing further needed at this time (message sent to [facility] in [place] office as Juluis Rainier).

## 2019-05-13 ENCOUNTER — Encounter: Payer: Self-pay | Admitting: Pain Medicine

## 2019-05-15 ENCOUNTER — Ambulatory Visit: Payer: Self-pay

## 2019-05-15 ENCOUNTER — Ambulatory Visit (INDEPENDENT_AMBULATORY_CARE_PROVIDER_SITE_OTHER): Payer: Medicare Other | Admitting: Family Medicine

## 2019-05-15 ENCOUNTER — Other Ambulatory Visit: Payer: Self-pay

## 2019-05-15 ENCOUNTER — Encounter: Payer: Self-pay | Admitting: Family Medicine

## 2019-05-15 VITALS — BP 110/84 | HR 68 | Ht 70.0 in | Wt 217.0 lb

## 2019-05-15 DIAGNOSIS — M170 Bilateral primary osteoarthritis of knee: Secondary | ICD-10-CM

## 2019-05-15 DIAGNOSIS — M25561 Pain in right knee: Secondary | ICD-10-CM | POA: Diagnosis not present

## 2019-05-15 DIAGNOSIS — G8929 Other chronic pain: Secondary | ICD-10-CM

## 2019-05-15 DIAGNOSIS — I6523 Occlusion and stenosis of bilateral carotid arteries: Secondary | ICD-10-CM

## 2019-05-15 NOTE — Patient Instructions (Signed)
Good to see you. Right knee Monovisc. If not better consider PRP Injected hand again. Wear the brace on a more regular basis. See me again in 6 to 8 weeks

## 2019-05-15 NOTE — Progress Notes (Signed)
[name] [address] [place], [place] [address] Phone: [phone] Subjective:   Melinda [name], am serving as a scribe for Dr. Hulan Saas.    CC: Right knee pain  YBO:[id number]   04/17/2019 Patient was given injection today.  Tolerated the procedure well.  Discussed icing regimen and home exercise.  Discussed which activities to do which wants to avoid.  Follow-up again in 4 to 8 weeks  Update 04/17/2019 Melinda [name] is a 63 y.o. female coming in with complaint of right knee pain. Patient states that she did not have a lot of relief after having her knee drained and a steroid injection. Feels that her knee is unstable.   Also complaining of right 5th finger pain over the MCP joint. Said that skin is painful to the touch. Having a hard time gripping items. Does woodworking and uses her hands a lot.       Past Medical History:  Diagnosis Date  . 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 Amputation of second toe, right, traumatic ([place]) 09/2017  . Anemia    years ago  . Anxiety   . Blue toes    2nd toe on right foot, will get appt.  . Bulging disc   . CAD (coronary artery disease) 2009   s/p stent to LAD  . 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  Ablations done.  Now has rare episodes  . Chicken pox   . Chronic fatigue   . Chronic kidney disease    "problem with kidney filtration"  . Coronary arteritis   . Degenerative disc disease   . Depression   . Diabetes mellitus without complication ([place])   . Facet joint disease   . Fibromyalgia   . Heart disease   . Hyperlipidemia   . IBS (irritable bowel syndrome)   . MCL deficiency, knee   . MRSA (methicillin resistant staph aureus) culture positive 2011   GREAT TOE RIGHT FOOT  . Neuropathy 04/18/2010  . Peripheral neuropathy   . Renal insufficiency   . Restless leg syndrome   . Sepsis ([place]) 09/2013  . Sleep apnea 08/17/2003   uses CPAP,  sleep study at [facility] (mild to moderate)  . Spinal stenosis    Past Surgical History:  Procedure Laterality Date  . ABLATION     UTERUS  . ABLATION     HEART  . AMPUTATION TOE Right 02/01/2017   Procedure: AMPUTATION TOE-RIGHT 2ND MPJ;  Surgeon: Albertine Patricia, DPM;  Location: [facility];  Service: Podiatry;  Laterality: Right;  . ANTERIOR CERVICAL DECOMP/DISCECTOMY FUSION N/A 07/28/2014   Procedure: ANTERIOR CERVICAL DECOMPRESSION/DISCECTOMY FUSION CERVICAL 3-4,4-5,5-6 LEVELS WITH INSTRUMENTATION AND ALLOGRAFT;  Surgeon: Sinclair Ship, MD;  Location: [place];  Service: Orthopedics;  Laterality: N/A;  Anterior cervical decompression fusion, cervical 3-4, cervical 4-5, cervical 5-6 with instrumentation and allograft  . BACK SURGERY     X 3 1979, 1994, 1995  . CARPAL TUNNEL RELEASE Right   . CHOLECYSTECTOMY  2003  . CORONARY ANGIOPLASTY  2008  . EYE SURGERY Bilateral 2013   Eyelid lift   . FOOT SURGERY Right    BIG TOE  . FOOT SURGERY Bilateral    PLANTAR FASCIITIS  . FOOT SURGERY Right    2ND TOE  . GALLBLADDER SURGERY    . HAMMER TOE SURGERY Right 10/17/2017   Procedure: HAMMER TOE CORRECTION-4TH TOE;  Surgeon: Albertine Patricia, DPM;  Location: [place];  Service: Podiatry;  Laterality: Right;  LMA- WITH LOCAL Diabetic - diet controlled  . HAND SURGERY Left   . HAND SURGEY Left   . HEART STENT  2009   LAD  . INFUSION PUMP IMPLANTATION     X2 with morphine and baclofen  . INTRATHECAL PUMP REVISION N/A 04/25/2018   Procedure: Intrathecal pump replacement;  Surgeon: Clydell Hakim, MD;  Location: [place];  Service: Neurosurgery;  Laterality: N/A;  right  . INTRATHECAL PUMP REVISION Right 04/25/2018  . INTRATHECAL PUMP REVISION N/A 12/12/2018   Procedure: Intrathecal pump revision with exploration of pocket;  Surgeon: Clydell Hakim, MD;  Location: [place];  Service: Neurosurgery;  Laterality: N/A;  Intrathecal pump revision with exploration of pocket  .  IRRIGATION AND DEBRIDEMENT FOOT Right 02/23/2017   Procedure: IRRIGATION AND DEBRIDEMENT FOOT-right foot;  Surgeon: Samara Deist, DPM;  Location: [facility];  Service: Podiatry;  Laterality: Right;  . PAIN PUMP REVISION N/A 08/15/2018   Procedure: Intrathecal PUMP REVISION;  Surgeon: Clydell Hakim, MD;  Location: [place];  Service: Neurosurgery;  Laterality: N/A;  INTRATHECAL PUMP REVISION  . TOE SURGERY     then revision 8/18   Social History   Socioeconomic History  . Marital status: Significant Other    Spouse name: Not on file  . Number of children: Not on file  . Years of education: Not on file  . Highest education level: Not on file  Occupational History  . Not on file  Social Needs  . Financial resource strain: Not on file  . Food insecurity    Worry: Not on file    Inability: Not on file  . Transportation needs    Medical: Not on file    Non-medical: Not on file  Tobacco Use  . Smoking status: Former Smoker    Packs/day: 1.50    Years: 32.00    Pack years: 48.00    Types: Cigarettes    Quit date: 07/22/2007    Years since quitting: 11.8  . Smokeless tobacco: Never Used  Substance and Sexual Activity  . Alcohol use: Yes    Alcohol/week: 0.0 standard drinks    Comment: occ - Holidays  . Drug use: [name]  . Sexual activity: Yes  Lifestyle  . Physical activity    Days per week: Not on file    Minutes per session: Not on file  . Stress: Not on file  Relationships  . Social Herbalist on phone: Not on file    Gets together: Not on file    Attends religious service: Not on file    Active member of club or organization: Not on file    Attends meetings of clubs or organizations: Not on file    Relationship status: Not on file  Other Topics Concern  . Not on file  Social History Narrative   Lives at home with a partner. Independent at baseline   [name] Known Allergies Family History  Problem Relation Age of Onset  . Lung cancer Mother   . Diabetes Father   .  Heart disease Maternal Grandfather   . Diabetes Paternal Grandmother   . Colon cancer Paternal Grandfather   . Stroke Neg Hx      Current Outpatient Medications (Cardiovascular):  .  nitroGLYCERIN (NITROSTAT) 0.4 MG SL tablet, Place 1 tablet (0.4 mg total) under the tongue every 5 (five) minutes as needed for chest pain. Please keep upcoming appt in October. Thank  you .  rosuvastatin (CRESTOR) 20 MG tablet, TAKE 1 TABLET BY MOUTH EVERY DAY  Current Outpatient Medications (Respiratory):  .  fluticasone (FLONASE) 50 MCG/ACT nasal spray, Place 2 sprays into both nostrils daily.  Current Outpatient Medications (Analgesics):  .  aspirin 81 MG tablet, Take 81 mg by mouth daily. Marland Kitchen  oxyCODONE (OXY IR/ROXICODONE) 5 MG immediate release tablet, Take 1 tablet (5 mg total) by mouth every 8 (eight) hours as needed for severe pain. Must last 30 days. Derrill Memo ON 06/05/2019] oxyCODONE (OXY IR/ROXICODONE) 5 MG immediate release tablet, Take 1 tablet (5 mg total) by mouth every 8 (eight) hours as needed for severe pain. Must last 30 days. Derrill Memo ON 07/05/2019] oxyCODONE (OXY IR/ROXICODONE) 5 MG immediate release tablet, Take 1 tablet (5 mg total) by mouth every 8 (eight) hours as needed for severe pain. Must last 30 days.   Current Outpatient Medications (Other):  Marland Kitchen  ACCU-CHEK AVIVA PLUS test strip, TEST 1 TIME DAILY **E11.9** .  ACCU-CHEK SOFTCLIX LANCETS lancets, USE TO CHECK BLOOD SUGAR 1 TIME DAILY. DX: E11.42 .  AMBULATORY NON FORMULARY MEDICATION, Medication Name: CPAP MASK OF CHOICE FOR HOME DEVICE .  buPROPion (WELLBUTRIN XL) 150 MG 24 hr tablet, Take 1 tablet (150 mg total) by mouth daily. .  Cinnamon 500 MG TABS, Take 1,000 mg by mouth daily. .  Coenzyme Q10 (CO Q 10) 100 MG CAPS, Take 100 mg by mouth daily.  Marland Kitchen  gentamicin ointment (GARAMYCIN) 0.1 %, APPLY TO NOSE THREE TIMES DAILY .  glucosamine-chondroitin 500-400 MG tablet, Take 2 tablets by mouth daily.  .  Magnesium 250 MG TABS, Take 250  mg by mouth daily. .  Misc Natural Products (TART CHERRY ADVANCED PO), Take 1,200 mg by mouth at bedtime.  .  Multiple Vitamin (MULTIVITAMIN) tablet, Take 1 tablet by mouth daily. .  naloxone (NARCAN) 2 MG/2ML injection, Inject 1 mL (1 mg total) into the muscle as needed for up to 2 doses (for opioid overdose). Inject content of syringe into thigh muscle. Call 911. .  NONFORMULARY OR COMPOUNDED ITEM, 147.95 mcg by Epidural Infusion route Continuous EPIDURAL. Medtronic Neuromodulation pump Fentanyl 147.95 mcg, Baclofen, bupivicaine .  NONFORMULARY OR COMPOUNDED ITEM, 88.77 mcg by Epidural Infusion route Continuous EPIDURAL. Medtronic Neuromodulation pump: Fentanyl, baclofen 88.77, bupivicane .  NONFORMULARY OR COMPOUNDED ITEM, 5.918 mg by Epidural Infusion route Continuous EPIDURAL. Medtronic Neuromodulation pump: Fentanyl, Baclofen, Bupivicaine 5.918 mg .  Omega-3 Fatty Acids (FISH OIL) 1000 MG CAPS, Take 2,000 mg by mouth 2 (two) times daily.  Marland Kitchen  PARoxetine (PAXIL) 20 MG tablet, TAKE ONE-HALF OF A TABLET BY MOUTH DAILY IN THE MORNING .  polyethylene glycol (MIRALAX / GLYCOLAX) packet, Take 17 g by mouth daily.  .  pregabalin (LYRICA) 150 MG capsule, Take 1 capsule (150 mg total) by mouth 3 (three) times daily. Marland Kitchen  tiZANidine (ZANAFLEX) 4 MG tablet, Take 1 tablet (4 mg total) by mouth 3 (three) times daily. .  Turmeric 500 MG TABS, Take 500 mg by mouth 2 (two) times daily.  Marland Kitchen  VITAMIN E PO, Take 450 Units by mouth daily.     Past medical history, social, surgical and family history all reviewed in electronic medical record.  [name] pertanent information unless stated regarding to the chief complaint.   Review of Systems:  [name] headache, visual changes, nausea, vomiting, diarrhea, constipation, dizziness, abdominal pain, skin rash, fevers, chills, night sweats, weight loss, swollen lymph nodes, , chest pain, shortness of breath,  mood changes.  Positive muscle aches, joint swelling, body aches   Objective  Blood pressure 110/84, pulse 68, height 5\' 10"  (1.778 m), weight 217 lb (98.4 kg), SpO2 97 %.    General: [name] apparent distress alert and oriented x3 mood and affect normal, dressed appropriately.  HEENT: Pupils equal, extraocular movements intact  Respiratory: Patient's speak in full sentences and does not appear short of breath  Cardiovascular: [name] lower extremity edema, non tender, [name] erythema  Skin: Warm dry intact with [name] signs of infection or rash on extremities or on axial skeleton.  Abdomen: Soft nontender  Neuro: Cranial nerves II through XII are intact, neurovascularly intact in all extremities with 2+ DTRs and 2+ pulses.  Lymph: [name] lymphadenopathy of posterior or anterior cervical chain or axillae bilaterally.  Gait antalgic gait MSK:  tender with limited range of motion and good stability and symmetric strength and tone of shoulders, elbows, wrist, hip, and ankles bilaterally.  Pain is out of proportion to the amount of palpation about the joints  Knee: Right valgus deformity noted. Large thigh to calf ratio.  Tender to palpation over medial and PF joint line.  ROM full in flexion and extension and lower leg rotation. instability with valgus force.  Effusion noted painful patellar compression. Patellar glide with moderate crepitus. Patellar and quadriceps tendons unremarkable. Hamstring and quadriceps strength is normal. Contralateral knee shows moderate arthritic changes as well  Procedure: Real-time Ultrasound Guided Injection of right knee Device: GE Logiq Q7 Ultrasound guided injection is preferred based studies that show increased duration, increased effect, greater accuracy, decreased procedural pain, increased response rate, and decreased cost with ultrasound guided versus blind injection.  Verbal informed consent obtained.  Time-out conducted.  Noted [name] overlying erythema, induration, or other signs of local infection.  Skin prepped in a sterile fashion.   Local anesthesia: Topical Ethyl chloride.  With sterile technique and under real time ultrasound guidance: With a 22-gauge 2 inch needle patient was injected with 4 cc of 0.5% Marcaine and aspirated 65 cc of straw-colored fluid then injected 22 mg/mL of Monovisc hyaluronic acid . This was from a superior lateral approach.  Completed without difficulty  Pain immediately resolved suggesting accurate placement of the medication.  Advised to call if fevers/chills, erythema, induration, drainage, or persistent bleeding.  Images permanently stored and available for review in the ultrasound unit.  Impression: Technically successful ultrasound guided injection.    Impression and Recommendations:     This case required medical decision making of moderate complexity. The above documentation has been reviewed and is accurate and complete Lyndal Pulley, DO       Note: This dictation was prepared with Dragon dictation along with smaller phrase technology. Any transcriptional errors that result from this process are unintentional.

## 2019-05-15 NOTE — Assessment & Plan Note (Signed)
Patient has failed conservative therapy.  Was given Monovisc previously and has had some improvement.  Attempted it today.  Discussed icing regimen, discussed only thing left is PRP is a possibility.  Increase activity as tolerated.  Follow-up again in 4 to 8 weeks.

## 2019-05-18 ENCOUNTER — Other Ambulatory Visit: Payer: Self-pay | Admitting: Internal Medicine

## 2019-05-18 ENCOUNTER — Encounter: Payer: Self-pay | Admitting: Pain Medicine

## 2019-05-18 DIAGNOSIS — R200 Anesthesia of skin: Secondary | ICD-10-CM | POA: Diagnosis not present

## 2019-05-18 DIAGNOSIS — E559 Vitamin D deficiency, unspecified: Secondary | ICD-10-CM | POA: Diagnosis not present

## 2019-05-18 DIAGNOSIS — R202 Paresthesia of skin: Secondary | ICD-10-CM | POA: Diagnosis not present

## 2019-05-18 DIAGNOSIS — R278 Other lack of coordination: Secondary | ICD-10-CM | POA: Diagnosis not present

## 2019-05-18 DIAGNOSIS — M5090 Cervical disc disorder, unspecified, unspecified cervical region: Secondary | ICD-10-CM | POA: Diagnosis not present

## 2019-05-18 DIAGNOSIS — E538 Deficiency of other specified B group vitamins: Secondary | ICD-10-CM | POA: Diagnosis not present

## 2019-05-18 DIAGNOSIS — E531 Pyridoxine deficiency: Secondary | ICD-10-CM | POA: Diagnosis not present

## 2019-05-18 DIAGNOSIS — E519 Thiamine deficiency, unspecified: Secondary | ICD-10-CM | POA: Diagnosis not present

## 2019-05-19 ENCOUNTER — Ambulatory Visit: Payer: Medicare Other | Admitting: Pain Medicine

## 2019-05-21 ENCOUNTER — Other Ambulatory Visit: Payer: Self-pay | Admitting: Neurology

## 2019-05-21 ENCOUNTER — Other Ambulatory Visit (HOSPITAL_COMMUNITY): Payer: Self-pay | Admitting: Neurology

## 2019-05-21 DIAGNOSIS — M5090 Cervical disc disorder, unspecified, unspecified cervical region: Secondary | ICD-10-CM

## 2019-05-21 DIAGNOSIS — R278 Other lack of coordination: Secondary | ICD-10-CM

## 2019-05-26 ENCOUNTER — Encounter: Payer: Self-pay | Admitting: Cardiology

## 2019-05-26 ENCOUNTER — Other Ambulatory Visit: Payer: Self-pay

## 2019-05-26 ENCOUNTER — Ambulatory Visit (INDEPENDENT_AMBULATORY_CARE_PROVIDER_SITE_OTHER): Payer: Medicare Other | Admitting: Cardiology

## 2019-05-26 VITALS — BP 136/80 | HR 74 | Ht 70.0 in | Wt 218.8 lb

## 2019-05-26 DIAGNOSIS — I6523 Occlusion and stenosis of bilateral carotid arteries: Secondary | ICD-10-CM

## 2019-05-26 DIAGNOSIS — I2583 Coronary atherosclerosis due to lipid rich plaque: Secondary | ICD-10-CM | POA: Diagnosis not present

## 2019-05-26 DIAGNOSIS — I208 Other forms of angina pectoris: Secondary | ICD-10-CM | POA: Diagnosis not present

## 2019-05-26 DIAGNOSIS — I2510 Atherosclerotic heart disease of native coronary artery without angina pectoris: Secondary | ICD-10-CM | POA: Diagnosis not present

## 2019-05-26 DIAGNOSIS — R072 Precordial pain: Secondary | ICD-10-CM | POA: Diagnosis not present

## 2019-05-26 DIAGNOSIS — I951 Orthostatic hypotension: Secondary | ICD-10-CM

## 2019-05-26 NOTE — Progress Notes (Signed)
[facility] [address], [address] [place], [place]  [address] Phone: [phone] Fax:  [phone]  Date:  05/26/2019   ID:  Melinda [name], DOB [birth year], MRN [id number]  PCP:  Jearld Fenton, NP   History of Present Illness: Melinda [name] is a 63 y.o. 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 Cardiac catheterization 2012 revealed patent LAD stent.   She underwent a nuclear stress test 2014 which demonstrated mild anterolateral ischemia with normal ejection fraction. This was a very similar finding asked to prior stress test in August of 2012 which resulted in catheterization in August of 2012 which showed patent LAD stent, DES. There was a diagonal branch jailed by stent with minor luminal irregularity at the ostium. EF was normal. Because of these symptoms, I initiated metoprolol extender release 25 mg to see if this would help.  She felt crummy. Fatigue. Unsure if it's her chronic fatigue, fibromyalgia. We decided to initiate isosorbide. She did feel some head rushing sensation she was bending over or wiping her dogs paws. Her heart rate also has not been increasing as well as usual. She also feels more fatigued.   On 10/09/12 I decided to discontinue her metoprolol 25 mg. On Nov 14, 2012 she is here for followup after discontinuation of beta blocker.  02/18/13 - Doing well. Rare palpitation off metop. Taking [name] class. Enjoy.  03/02/14 - Hospitalized with strep infection. Sepsis.  Echocardiogram reassuring. Infectious disease consultation. Much improved. Fever was as high as 105 she says.   Trying to avoid metformin.A1c 6.9. Lost weight. Eating well. Exercising.   06/09/14-had an episode of chest discomfort that was concerning to her. Prompted this office visit today. She had an episode of pain was 6/10, mid low sternum,  tried to stretch, sat down, pain started to lessen took one nitroglycerin with relief, no nausea or vomiting diaphoresis shortness of breath. She noted some pain in the right side of her neck when she bent over. Pounding in right side of neck. Felt like she did prior to stent.  She had another milder episode of chest pain previous evening. Nitroglycerin improved. She was concerned about the sore spot on her neck. It seems to hurt to touch but it is not enlarged.Walking loosing weight. Not under stress.   03/04/15 - Had cervical spine surgery. Left hand first 3 fingers sometimes have tingling or numbness. Question carpal tunnel as well. No chest pain, no shortness of breath.  Wearing knee brace. Unstable. Right sciatica. Rare palps. Usually last seconds in duration. Stress can sometimes trigger. Overall no chest pain, no fevers, no chills, no shortness of breath.  10/12/16 - feels like heart cant keep up with her. She feels like when she tries to exercise vigorously that she was to go further but her heart won't let her. No chest pain.  She is also had the sensation of near syncope where her vision may darken, ears ring. When she was at the previous doctor's visit her heart pressure was in the low 100s. She has been on her anti-spasmodics for quite some time.  05/26/2019-here for the follow-up of dizziness.  In review of prior note from 04/15/2019 she had a variety of complaints not felt well for several months positional dizziness was to the point where she will faint no frank syncope, chronic pain fibromyalgia  continuous epidural pump placed in the right lower abdomen she gets lightheaded weak ears ringing ACE inhibitor has been cut back for this.  T[place]itt Merle at last visit stopped her ACE inhibitor.  Echo was updated and looked fine, no changes.  Midodrine was considered to keep her blood pressure up.  This was not felt to be anginal symptoms.  Only taking statin. Bend over and come back up and sees  stars, darker lens. Pressure in neck was her angina feeling before, pain , throbbing. Converge in bottom of neck then going to solar plexus.   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   Bulging disc    CAD (coronary artery disease) 2009   s/p stent to LAD   Cardiac arrhythmia due to congenital heart disease    WPW.  Ablations done.  Now has rare episodes   Chicken pox    Chronic fatigue    Chronic kidney disease    "problem with kidney filtration"   Coronary arteritis    Degenerative disc disease    Depression    Diabetes mellitus without complication (HCC)    Facet joint disease    Fibromyalgia    Heart disease    Hyperlipidemia    IBS (irritable bowel syndrome)    MCL deficiency, knee    MRSA (methicillin resistant staph aureus) culture positive 2011   GREAT TOE RIGHT FOOT   Neuropathy 04/18/2010   Peripheral neuropathy    Renal insufficiency    Restless leg syndrome    Sepsis ([place]) 09/2013   Sleep apnea 08/17/2003   uses CPAP, sleep study at [facility] (mild to moderate)   Spinal stenosis     Past Surgical History:  Procedure Laterality Date   ABLATION     UTERUS   ABLATION     HEART   AMPUTATION TOE Right 02/01/2017   Procedure: AMPUTATION TOE-RIGHT 2ND MPJ;  Surgeon: Albertine Patricia, DPM;  Location: [facility];  Service: Podiatry;  Laterality: Right;   ANTERIOR CERVICAL DECOMP/DISCECTOMY FUSION N/A 07/28/2014   Procedure: ANTERIOR CERVICAL DECOMPRESSION/DISCECTOMY FUSION CERVICAL 3-4,4-5,5-6 LEVELS WITH INSTRUMENTATION AND ALLOGRAFT;  Surgeon: Sinclair Ship, MD;  Location: [place];   Service: Orthopedics;  Laterality: N/A;  Anterior cervical decompression fusion, cervical 3-4, cervical 4-5, cervical 5-6 with instrumentation and allograft   BACK SURGERY     X 3 1979, 1994, 1995   CARPAL TUNNEL RELEASE Right    CHOLECYSTECTOMY  2003   CORONARY ANGIOPLASTY  2008   EYE SURGERY Bilateral 2013   Eyelid lift    FOOT SURGERY Right    BIG TOE   FOOT SURGERY Bilateral    PLANTAR FASCIITIS   FOOT SURGERY Right    2ND TOE   GALLBLADDER SURGERY     HAMMER TOE SURGERY Right 10/17/2017   Procedure: HAMMER TOE CORRECTION-4TH TOE;  Surgeon: Albertine Patricia, DPM;  Location: [place];  Service: Podiatry;  Laterality: Right;  LMA- WITH LOCAL Diabetic - diet controlled   HAND SURGERY Left    HAND SURGEY Left  HEART STENT  2009   LAD   INFUSION PUMP IMPLANTATION     X2 with morphine and baclofen   INTRATHECAL PUMP REVISION N/A 04/25/2018   Procedure: Intrathecal pump replacement;  Surgeon: Clydell Hakim, MD;  Location: [place];  Service: Neurosurgery;  Laterality: N/A;  right   INTRATHECAL PUMP REVISION Right 04/25/2018   INTRATHECAL PUMP REVISION N/A 12/12/2018   Procedure: Intrathecal pump revision with exploration of pocket;  Surgeon: Clydell Hakim, MD;  Location: [place];  Service: Neurosurgery;  Laterality: N/A;  Intrathecal pump revision with exploration of pocket   IRRIGATION AND DEBRIDEMENT FOOT Right 02/23/2017   Procedure: IRRIGATION AND DEBRIDEMENT FOOT-right foot;  Surgeon: Samara Deist, DPM;  Location: [facility];  Service: Podiatry;  Laterality: Right;   PAIN PUMP REVISION N/A 08/15/2018   Procedure: Intrathecal PUMP REVISION;  Surgeon: Clydell Hakim, MD;  Location: [place];  Service: Neurosurgery;  Laterality: N/A;  INTRATHECAL PUMP REVISION   TOE SURGERY     then revision 8/18    Current Outpatient Medications  Medication Sig Dispense Refill   ACCU-CHEK AVIVA PLUS test strip TEST 1 TIME DAILY **E11.9** 200 each 2   ACCU-CHEK SOFTCLIX  LANCETS lancets USE TO CHECK BLOOD SUGAR 1 TIME DAILY. DX: E11.42 100 each 2   AMBULATORY NON FORMULARY MEDICATION Medication Name: CPAP MASK OF CHOICE FOR HOME DEVICE 1 each 0   aspirin 81 MG tablet Take 81 mg by mouth daily.     buPROPion (WELLBUTRIN XL) 150 MG 24 hr tablet TAKE 1 TABLET BY MOUTH EVERY DAY 90 tablet 2   Cinnamon 500 MG TABS Take 1,000 mg by mouth daily.     Coenzyme Q10 (CO Q 10) 100 MG CAPS Take 100 mg by mouth daily.      fluticasone (FLONASE) 50 MCG/ACT nasal spray Place 2 sprays into both nostrils daily.     gentamicin ointment (GARAMYCIN) 0.1 % APPLY TO NOSE THREE TIMES DAILY     glucosamine-chondroitin 500-400 MG tablet Take 2 tablets by mouth daily.      Magnesium 250 MG TABS Take 250 mg by mouth daily.     Misc Natural Products (TART CHERRY ADVANCED PO) Take 1,200 mg by mouth at bedtime.      Multiple Vitamin (MULTIVITAMIN) tablet Take 1 tablet by mouth daily.     naloxone (NARCAN) 2 MG/2ML injection Inject 1 mL (1 mg total) into the muscle as needed for up to 2 doses (for opioid overdose). Inject content of syringe into thigh muscle. Call 911. 2 Syringe 1   nitroGLYCERIN (NITROSTAT) 0.4 MG SL tablet Place 1 tablet (0.4 mg total) under the tongue every 5 (five) minutes as needed for chest pain. Please keep upcoming appt in October. Thank you 25 tablet 1   NONFORMULARY OR COMPOUNDED ITEM 147.95 mcg by Epidural Infusion route Continuous EPIDURAL. Medtronic Neuromodulation pump Fentanyl 147.95 mcg, Baclofen, bupivicaine     NONFORMULARY OR COMPOUNDED ITEM 88.77 mcg by Epidural Infusion route Continuous EPIDURAL. Medtronic Neuromodulation pump: Fentanyl, baclofen 88.77, bupivicane     NONFORMULARY OR COMPOUNDED ITEM 5.918 mg by Epidural Infusion route Continuous EPIDURAL. Medtronic Neuromodulation pump: Fentanyl, Baclofen, Bupivicaine 5.918 mg     Omega-3 Fatty Acids (FISH OIL) 1000 MG CAPS Take 2,000 mg by mouth 2 (two) times daily.      oxyCODONE (OXY  IR/ROXICODONE) 5 MG immediate release tablet Take 1 tablet (5 mg total) by mouth every 8 (eight) hours as needed for severe pain. Must last 30 days. 90 tablet 0   [  START ON 06/05/2019] oxyCODONE (OXY IR/ROXICODONE) 5 MG immediate release tablet Take 1 tablet (5 mg total) by mouth every 8 (eight) hours as needed for severe pain. Must last 30 days. 90 tablet 0   [START ON 07/05/2019] oxyCODONE (OXY IR/ROXICODONE) 5 MG immediate release tablet Take 1 tablet (5 mg total) by mouth every 8 (eight) hours as needed for severe pain. Must last 30 days. 90 tablet 0   PARoxetine (PAXIL) 20 MG tablet TAKE ONE-HALF OF A TABLET BY MOUTH DAILY IN THE MORNING 90 tablet 0   polyethylene glycol (MIRALAX / GLYCOLAX) packet Take 17 g by mouth daily.      pregabalin (LYRICA) 150 MG capsule Take 1 capsule (150 mg total) by mouth 3 (three) times daily. 270 capsule 1   rosuvastatin (CRESTOR) 20 MG tablet TAKE 1 TABLET BY MOUTH EVERY DAY 90 tablet 0   tiZANidine (ZANAFLEX) 4 MG tablet Take 1 tablet (4 mg total) by mouth 3 (three) times daily. 270 tablet 1   Turmeric 500 MG TABS Take 500 mg by mouth 2 (two) times daily.      VITAMIN E PO Take 450 Units by mouth daily.      No current facility-administered medications for this visit.     Allergies:   No Known Allergies  Social History:  The patient  reports that she quit smoking about 11 years ago. Her smoking use included cigarettes. She has a 48.00 pack-year smoking history. She has never used smokeless tobacco. She reports current alcohol use. She reports that she does not use drugs.   Family History  Problem Relation Age of Onset   Lung cancer Mother    Diabetes Father    Heart disease Maternal Grandfather    Diabetes Paternal Grandmother    Colon cancer Paternal Grandfather    Stroke Neg Hx     ROS:  Please see the history of present illness.  No other ROS, neg  PHYSICAL EXAM: VS:  BP 136/80    Pulse 74    Ht 5\' 10"  (1.778 m)    Wt 218 lb 12.8 oz  (99.2 kg)    LMP  (LMP Unknown)    SpO2 98%    BMI 31.39 kg/m  GEN: Well nourished, well developed, in no acute distress  HEENT: normal, prior neck surg.  Neck: no JVD, carotid bruits, or masses Cardiac: RRR; no murmurs, rubs, or gallops,no edema  Respiratory:  clear to auscultation bilaterally, normal work of breathing GI: soft, nontender, nondistended, + BS MS: no deformity or atrophy  Skin: warm and dry, no rash, minor sun burn Neuro:  Alert and Oriented x 3, Strength and sensation are intact Psych: euthymic mood, full affect   EKG:  10/12/16-sinus rhythm with no other abnormalities. Personally viewed-prior 06/09/14-sinus rhythm, 71, no other ST segment changes.  Echocardiogram 04/22/2019:  1. Left ventricular ejection fraction, by visual estimation, is 60 to 65%. The left ventricle has normal function. Normal left ventricular size. There is no left ventricular hypertrophy. Normal wall motion. Normal diastolic function.  2. Global right ventricle has normal systolic function.The right ventricular size is normal. No increase in right ventricular wall thickness.  3. Left atrial size was normal.  4. Right atrial size was normal.  5. The mitral valve is normal in structure. No evidence of mitral valve regurgitation. No evidence of mitral stenosis.  6. The tricuspid valve is normal in structure. Tricuspid valve regurgitation is trivial.  7. The aortic valve is tricuspid Aortic valve regurgitation  was not visualized by color flow Doppler. Structurally normal aortic valve, with no evidence of sclerosis or stenosis.  8. The tricuspid regurgitant velocity is 2.59 m/s, and with an assumed right atrial pressure of 8 mmHg, the estimated right ventricular systolic pressure is mildly elevated at 34.8 mmHg.  9. The inferior vena cava is normal in size with greater than 50% respiratory variability, suggesting right atrial pressure of 3 mmHg.  Carotid Dopplers 04/24/2019 She has 40 to 59% stenosis on the  right and 1 to 39% on the left.  Needs repeat study in one year.  ASSESSMENT AND PLAN:  1. Carotid artery disease-. Moderate. We will continue to monitor. Dopplers performed in December 2016 approximate 50% bilateral carotid artery stenosis. Asymptomatic. I offered her vascular referral previously but she would like to wait at this point.  No strokelike symptoms.  Continue with aggressive secondary prevention. 2. Coronary artery disease-she is feeling some neck fullness which was her prior anginal equivalent.  We will check a Lexiscan stress test.  LAD stent patent at last catheterization.  EKG previously unremarkable.  At a prior visit had to stop her isosorbide because of orthostatic hypotension.  She is doing well without.  She has been off of beta blocker for quite some time secondary to fatigue.  Infact her ACE inhibitor was stopped out of fear of orthostasis.  Continue with adequate hydration. 3. Orthostatic hypotension-145/74-124/70.  Conservative measures at this time.  She states that she does eat quite a bit of salt.  Continue with hydration.  She does carry around with her a thermos with ice water.  Be very careful when bending over and looking under her truck in the morning for her dog.  This is usually when she feels the symptoms.  I would like to try to avoid midodrine if possible. 4. Obesity-continue to work on this.  It is challenging given her chronic pain. No changes made implant. She is a member of the [employer] which because of Covid is challenging 5. Hyperlipidemia-currently on Crestor. Doing well. No myalgias. LDL less than 70, 36 now. Goal.  No myalgias. 6. 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  At risk for future infection   Will see in follow up 12 months  Signed, Candee Furbish, MD [facility]  05/26/2019 2:38 PM

## 2019-05-26 NOTE — Patient Instructions (Signed)
Medication Instructions:  Your physician recommends that you continue on your current medications as directed. Please refer to the Current Medication list given to you today.  *If you need a refill on your cardiac medications before your next appointment, please call your pharmacy*  Testing/Procedures: Your physician has requested that you have a lexiscan myoview. For further information please visit [email or web address]. Please follow instruction sheet, as given.  Follow-Up: At [facility], you and your health needs are our priority.  As part of our continuing mission to provide you with exceptional heart care, we have created designated Provider Care Teams.  These Care Teams include your primary Cardiologist (physician) and Advanced Practice Providers (APPs -  Physician Assistants and Nurse Practitioners) who all work together to provide you with the care you need, when you need it.  Your next appointment:   1 year(s)  The format for your next appointment:   In Person  Provider:   Candee Furbish, MD

## 2019-06-02 DIAGNOSIS — T888XXD Other specified complications of surgical and medical care, not elsewhere classified, subsequent encounter: Secondary | ICD-10-CM | POA: Diagnosis not present

## 2019-06-02 NOTE — Progress Notes (Signed)
Patient's Name: Melinda [name]  MRN: [id number]  Referring Provider: Baity, Regina W, NP  DOB: 12/10/1955  PCP: Baity, Regina W, NP  DOS: 06/03/2019  Note by: Francisco A Naveira, MD  Service setting: Ambulatory outpatient  Specialty: Interventional Pain Management  Patient type: Established  Location: [facility] (AMB) [facility] Facility  Visit type: Interventional Procedure   Primary Reason for Visit: Interventional Pain Management Treatment. CC: Back Pain (left, lower)  Procedure:          Intrathecal Drug Delivery System (IDDS):  Type: Reservoir Refill (95990)       Region: Abdominal Laterality: Right  Type of Pump: Medtronic Synchromed II Delivery Route: Intrathecal Type of Pain Treated: Neuropathic/Nociceptive Primary Medication Class: Opioid/opiate  Medication, Concentration, Infusion Program, & Delivery Rate: Please see scanned programming printout.   Indications: 1. Chronic pain syndrome   2. Chronic knee pain (Primary Area of Pain) (Right)   3. Lumbar spinal stenosis w/ neurogenic claudication   4. Presence of intrathecal pump (Medtronic intrathecal programmable pump) (40 mL pump)   5. Presence of functional implant (Medtronic programmable intrathecal pump) (Right abdominal area)   6. Encounter for adjustment or management of infusion pump    Pain Assessment: Self-Reported Pain Score: 3 /10             Reported level is compatible with observation.        Pharmacotherapy Assessment  Analgesic: Oxycodone 5 mg, 1 tab PO q 8 hrs (15 mg/day of oxycodone) + Intrathecal PF-Fentanyl MME/day: 22.5 mg/day (oral).   Monitoring: Pharmacotherapy: No side-effects or adverse reactions reported. [place] PMP: PDMP reviewed during this encounter.       Compliance: No problems identified. Effectiveness: Clinically acceptable. Plan: Refer to "POC".  UDS:  Summary  Date Value Ref Range Status  07/24/2018 FINAL  Final    Comment:     ==================================================================== TOXASSURE SELECT 13 (MW) ==================================================================== Test                             Result       Flag       Units Drug Present and Declared for Prescription Verification   Oxycodone                      754          EXPECTED   ng/mg creat   Oxymorphone                    231          EXPECTED   ng/mg creat   Noroxycodone                   2463         EXPECTED   ng/mg creat   Noroxymorphone                 89           EXPECTED   ng/mg creat    Sources of oxycodone are scheduled prescription medications.    Oxymorphone, noroxycodone, and noroxymorphone are expected    metabolites of oxycodone. Oxymorphone is also available as a    scheduled prescription medication. Drug Present not Declared for Prescription Verification   Fentanyl                       4              UNEXPECTED ng/mg creat   Norfentanyl                    29           UNEXPECTED ng/mg creat    Source of fentanyl is a scheduled prescription medication,    including IV, patch, and transmucosal formulations. Norfentanyl    is an expected metabolite of fentanyl. ==================================================================== Test                      Result    Flag   Units      Ref Range   Creatinine              134              mg/dL      >=20 ==================================================================== Declared Medications:  The flagging and interpretation on this report are based on the  following declared medications.  Unexpected results may arise from  inaccuracies in the declared medications.  **Note: The testing scope of this panel includes these medications:  Oxycodone  **Note: The testing scope of this panel does not include following  reported medications:  Aspirin (Aspirin 81)  Bupropion (Wellbutrin)  Chondroitin (Glucosamine-Chondroitin)  Cinnamon  Glipizide (Glucotrol)  Glucosamine  (Glucosamine-Chondroitin)  Magnesium  Multivitamin  Naloxone  Nitroglycerin (Nitrostat)  Paroxetine  Polyethylene Glycol (GlycoLAX)  Polyethylene Glycol (MiraLAX)  Pregabalin (Lyrica)  Rosuvastatin (Crestor)  Supplement (Omega-3)  Tizanidine (Zanaflex)  Turmeric  Ubiquinone (Coenzyme Q 10)  Vitamin D2 (Drisdol)  Vitamin E ==================================================================== For clinical consultation, please call [phone]. ====================================================================    Intrathecal Pump Therapy Assessment  Manufacturer: Medtronic Synchromed Type: Programmable Volume: 40 mL reservoir MRI compatibility: Yes   Drug content:  Primary Medication Class: Opioid Primary Medication:PF-Fentanyl(1000mcg/mL)  Secondary Medication:PF-Bupivacaine(20mg/mL) Other Medication:PF-Baclofen (240mcg/mL)    Programming:  Type: Simple continuous. See pump readout for details.   Changes:  Medication Change: None at this point Rate Change: No change in rate  Reported side-effects or adverse reactions: None reported  Effectiveness: Described as relatively effective, allowing for increase in activities of daily living (ADL) Clinically meaningful improvement in function (CMIF): Sustained CMIF goals met  Plan: Pump refill today  Pre-op Assessment:  Melinda [name] is a 63 y.o. (year old), female patient, seen today for interventional treatment. 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 Melinda [name] has a current medication list which includes the following prescription(s): accu-chek aviva plus, accu-chek softclix lancets, AMBULATORY NON FORMULARY MEDICATION, aspirin, bupropion, cinnamon, co q 10, fluticasone, gentamicin ointment, glucosamine-chondroitin, magnesium, misc natural products, multivitamin, naloxone, nitroglycerin, NONFORMULARY OR COMPOUNDED ITEM, NONFORMULARY OR COMPOUNDED ITEM, NONFORMULARY OR COMPOUNDED ITEM, fish oil, oxycodone, oxycodone, paroxetine, polyethylene glycol, pregabalin, rosuvastatin, tizanidine, turmeric, vitamin e, and oxycodone. Her primarily concern today is the Back Pain (left, lower)  Initial Vital Signs:  Pulse/HCG Rate: 72  Temp: 98.4 F (36.9 C) Resp: 18 BP: 100/84 SpO2: 98 %  BMI: Estimated body mass index is 31.28 kg/m as   calculated from the following:   Height as of this encounter: 5' 10" (1.778 m).   Weight as of this encounter: 218 lb (98.9 kg).  Risk Assessment: Allergies: Reviewed. She has No Known Allergies.  Allergy Precautions: None required Coagulopathies: Reviewed. None identified.  Blood-thinner therapy: None at this time Active Infection(s): Reviewed. None identified. Melinda [name] is afebrile  Site Confirmation: Melinda [name] was asked to confirm the procedure and laterality before marking the site Procedure checklist: Completed Consent: Before the procedure and under the influence of no sedative(s), amnesic(s), or anxiolytics, the patient was informed of the treatment options, risks and possible complications. To fulfill our ethical and legal obligations, as recommended by the American Medical Association's Code of Ethics, I have informed the patient of my clinical impression; the nature and  purpose of the treatment or procedure; the risks, benefits, and possible complications of the intervention; the alternatives, including doing nothing; the risk(s) and benefit(s) of the alternative treatment(s) or procedure(s); and the risk(s) and benefit(s) of doing nothing.  Melinda [name] was provided with information about the general risks and possible complications associated with most interventional procedures. These include, but are not limited to: failure to achieve desired goals, infection, bleeding, organ or nerve damage, allergic reactions, paralysis, and/or death.  In addition, she was informed of those risks and possible complications associated to this particular procedure, which include, but are not limited to: damage to the implant; failure to decrease pain; local, systemic, or serious CNS infections, intraspinal abscess with possible cord compression and paralysis, or life-threatening such as meningitis; bleeding; organ damage; nerve injury or damage with subsequent sensory, motor, and/or autonomic system dysfunction, resulting in transient or permanent pain, numbness, and/or weakness of one or several areas of the body; allergic reactions, either minor or major life-threatening, such as anaphylactic or anaphylactoid reactions.  Furthermore, Melinda [name] was informed of those risks and complications associated with the medications. These include, but are not limited to: allergic reactions (i.e.: anaphylactic or anaphylactoid reactions); endorphine suppression; bradycardia and/or hypotension; water retention and/or peripheral vascular relaxation leading to lower extremity edema and possible stasis ulcers; respiratory depression and/or shortness of breath; decreased metabolic rate leading to weight gain; swelling or edema; medication-induced neural toxicity; particulate matter embolism and blood vessel occlusion with resultant organ, and/or nervous system infarction; and/or intrathecal granuloma  formation with possible spinal cord compression and permanent paralysis.  Before refilling the pump Melinda [name] was informed that some of the medications used in the devise may not be FDA approved for such use and therefore it constitutes an off-label use of the medications.  Finally, she was informed that Medicine is not an exact science; therefore, there is also the possibility of unforeseen or unpredictable risks and/or possible complications that may result in a catastrophic outcome. The patient indicated having understood very clearly. We have given the patient no guarantees and we have made no promises. Enough time was given to the patient to ask questions, all of which were answered to the patient's satisfaction. Melinda [name] has indicated that she wanted to continue with the procedure. Attestation: I, the ordering provider, attest that I have discussed with the patient the benefits, risks, side-effects, alternatives, likelihood of achieving goals, and potential problems during recovery for the procedure that I have provided informed consent. Date  Time: 06/03/2019 12:55 PM  Pre-Procedure Preparation:  Monitoring: As per clinic protocol. Respiration, ETCO2, SpO2, BP, heart rate and rhythm monitor placed and checked for adequate function Safety Precautions: Patient was assessed for   positional comfort and pressure points before starting the procedure. Time-out: I initiated and conducted the "Time-out" before starting the procedure, as per protocol. The patient was asked to participate by confirming the accuracy of the "Time Out" information. Verification of the correct person, site, and procedure were performed and confirmed by me, the nursing staff, and the patient. "Time-out" conducted as per Joint Commission's Universal Protocol (UP.01.01.01). Time: 1304  Description of Procedure:          Position: Supine Target Area: Central-port of intrathecal pump. Approach: Anterior, 90 degree angle  approach. Area Prepped: Entire Area around the pump implant. Prepping solution: DuraPrep (Iodine Povacrylex [0.7% available iodine] and Isopropyl Alcohol, 74% w/w) Safety Precautions: Aspiration looking for blood return was conducted prior to all injections. At no point did we inject any substances, as a needle was being advanced. No attempts were made at seeking any paresthesias. Safe injection practices and needle disposal techniques used. Medications properly checked for expiration dates. SDV (single dose vial) medications used. Description of the Procedure: Protocol guidelines were followed. Two nurses trained to do implant refills were present during the entire procedure. The refill medication was checked by both healthcare providers as well as the patient. The patient was included in the "Time-out" to verify the medication. The patient was placed in position. The pump was identified. The area was prepped in the usual manner. The sterile template was positioned over the pump, making sure the side-port location matched that of the pump. Both, the pump and the template were held for stability. The needle provided in the Medtronic Kit was then introduced thru the center of the template and into the central port. The pump content was aspirated and discarded volume documented. The new medication was slowly infused into the pump, thru the filter, making sure to avoid overpressure of the device. The needle was then removed and the area cleansed, making sure to leave some of the prepping solution back to take advantage of its long term bactericidal properties. The pump was interrogated and programmed to reflect the correct medication, volume, and dosage. The program was printed and taken to the physician for approval. Once checked and signed by the physician, a copy was provided to the patient and another scanned into the EMR. Vitals:   06/03/19 1255  BP: 100/84  Pulse: 72  Resp: 18  Temp: 98.4 F (36.9 C)   TempSrc: Temporal  SpO2: 98%  Weight: 218 lb (98.9 kg)  Height: 5' 10" (1.778 m)    Start Time: 1313 hrs. End Time: 1324 hrs. Materials & Medications: Medtronic Refill Kit Medication(s): Please see chart orders for details.  Imaging Guidance:          Type of Imaging Technique: None used Indication(s): N/A Exposure Time: No patient exposure Contrast: None used. Fluoroscopic Guidance: N/A Ultrasound Guidance: N/A Interpretation: N/A  Antibiotic Prophylaxis:   Anti-infectives (From admission, onward)   None     Indication(s): None identified  Post-operative Assessment:  Post-procedure Vital Signs:  Pulse/HCG Rate: 72  Temp: 98.4 F (36.9 C) Resp: 18 BP: 100/84 SpO2: 98 %  EBL: None  Complications: No immediate post-treatment complications observed by team, or reported by patient.  Note: The patient tolerated the entire procedure well. A repeat set of vitals were taken after the procedure and the patient was kept under observation following institutional policy, for this type of procedure. Post-procedural neurological assessment was performed, showing return to baseline, prior to discharge. The patient was provided with post-procedure discharge instructions,   including a section on how to identify potential problems. Should any problems arise concerning this procedure, the patient was given instructions to immediately contact us, at any time, without hesitation. In any case, we plan to contact the patient by telephone for a follow-up status report regarding this interventional procedure.  Comments:  No additional relevant information.  Plan of Care  Orders:  Orders Placed This Encounter  Procedures  . Pump Refill (Today)    Maintain Protocol by having two(2) healthcare providers during procedure and programming.    Scheduling Instructions:     Please refill intrathecal pump today.    Order Specific Question:   Where will this procedure be performed?    Answer:    [facility]  . Pump Refill (Schedule Return)    Whenever possible schedule on a procedure today.    Standing Status:   Future    Standing Expiration Date:   10/30/2019    Scheduling Instructions:     Please schedule intrathecal pump refill based on pump programming. Avoid schedule intervals of more than 120 days (4 months).    Order Specific Question:   Where will this procedure be performed?    Answer:   [facility]  . Consent: Pump Refill    Provider Attestation: I, Francisco A. Naveira, MD, (Pain Management Specialist), the physician/practitioner, attest that I have discussed with the patient the benefits, risks, side effects, alternatives, likelihood of achieving goals and potential problems during recovery for the procedure that I have provided informed consent.    Scheduling Instructions:     Procedure: Intrathecal Pump Refill     Attending Physician: Francisco A. Naveira, MD     Indications: Chronic Pain Syndrome (G89.4)     Transcribe to consent form and obtain patient signature.   Chronic Opioid Analgesic:  Oxycodone 5 mg, 1 tab PO q 8 hrs (15 mg/day of oxycodone) + Intrathecal PF-Fentanyl MME/day: 22.5 mg/day (oral).   Medications ordered for procedure: Meds ordered this encounter  Medications  . oxyCODONE (OXY IR/ROXICODONE) 5 MG immediate release tablet    Sig: Take 1 tablet (5 mg total) by mouth every 8 (eight) hours as needed for severe pain. Must last 30 days.    Dispense:  90 tablet    Refill:  0    Chronic Pain: STOP Act (Not applicable) Fill 1 day early if closed on refill date. Do not fill until: 08/04/2019. To last until: 09/03/2019. Avoid benzodiazepines within 8 hours of opioids   Medications administered: Princess Eckman had no medications administered during this visit.  See the medical record for exact dosing, route, and time of administration.  Follow-up plan:   No follow-ups on file.       Interventional treatment options:  Under  consideration: Diagnostic left lumbar facet block #1  Possible left lumbar facet RFA  Diagnostic caudal ESI + diagnostic epidurogram Possible Racz procedure Diagnostic bilateral IA knee injection (Steroid) Diagnostic bilateral genicular NB Possible bilateral genicular nerve RFA Possible bilateral Hyalgan knee injection DiagnosticcervicalESI Diagnostic bilateral cervical facet block Possible bilateral cervical facet RFA   Therapeutic/palliative (PRN): Palliative/therapeutic intrathecal pump management (refills/programming adjustments)  Palliative left L2-3 LESI #2     Recent Visits Date Type Provider Dept  05/06/19 Telemedicine Naveira, Francisco, MD [facility]  Showing recent visits within past 90 days and meeting all other requirements   Today's Visits Date Type Provider Dept  06/03/19 Procedure visit Naveira, Francisco, MD [facility]  Showing today's visits and meeting   all other requirements   Future Appointments Date Type Provider Dept  08/27/19 Appointment Naveira, Francisco, MD [facility]  Showing future appointments within next 90 days and meeting all other requirements   Disposition: Discharge home  Discharge Date & Time: 06/03/2019; 1345 hrs.   Primary Care Physician: Baity, Regina W, NP Location: [facility] Note by: Francisco A Naveira, MD Date: 06/03/2019; Time: 2:38 PM  Disclaimer:  Medicine is not an exact science. The only guarantee in medicine is that nothing is guaranteed. It is important to note that the decision to proceed with this intervention was based on the information collected from the patient. The Data and conclusions were drawn from the patient's questionnaire, the interview, and the physical examination. Because the information was provided in large part by the patient, it cannot be guaranteed that it has not been purposely or unconsciously manipulated. Every effort has  been made to obtain as much relevant data as possible for this evaluation. It is important to note that the conclusions that lead to this procedure are derived in large part from the available data. Always take into account that the treatment will also be dependent on availability of resources and existing treatment guidelines, considered by other Pain Management Practitioners as being common knowledge and practice, at the time of the intervention. For Medico-Legal purposes, it is also important to point out that variation in procedural techniques and pharmacological choices are the acceptable norm. The indications, contraindications, technique, and results of the above procedure should only be interpreted and judged by a Board-Certified Interventional Pain Specialist with extensive familiarity and expertise in the same exact procedure and technique. 

## 2019-06-03 ENCOUNTER — Encounter: Payer: Self-pay | Admitting: Pain Medicine

## 2019-06-03 ENCOUNTER — Other Ambulatory Visit: Payer: Self-pay

## 2019-06-03 ENCOUNTER — Ambulatory Visit: Payer: Medicare Other | Attending: Pain Medicine | Admitting: Pain Medicine

## 2019-06-03 ENCOUNTER — Telehealth (HOSPITAL_COMMUNITY): Payer: Self-pay

## 2019-06-03 VITALS — BP 100/84 | HR 72 | Temp 98.4°F | Resp 18 | Ht 70.0 in | Wt 218.0 lb

## 2019-06-03 DIAGNOSIS — Z978 Presence of other specified devices: Secondary | ICD-10-CM | POA: Diagnosis not present

## 2019-06-03 DIAGNOSIS — Z969 Presence of functional implant, unspecified: Secondary | ICD-10-CM | POA: Diagnosis not present

## 2019-06-03 DIAGNOSIS — M25561 Pain in right knee: Secondary | ICD-10-CM | POA: Insufficient documentation

## 2019-06-03 DIAGNOSIS — M48062 Spinal stenosis, lumbar region with neurogenic claudication: Secondary | ICD-10-CM | POA: Insufficient documentation

## 2019-06-03 DIAGNOSIS — G894 Chronic pain syndrome: Secondary | ICD-10-CM | POA: Diagnosis not present

## 2019-06-03 DIAGNOSIS — Z451 Encounter for adjustment and management of infusion pump: Secondary | ICD-10-CM | POA: Insufficient documentation

## 2019-06-03 DIAGNOSIS — G8929 Other chronic pain: Secondary | ICD-10-CM | POA: Insufficient documentation

## 2019-06-03 MED ORDER — OXYCODONE HCL 5 MG PO TABS: 5.0000 mg | ORAL_TABLET | Freq: Three times a day (TID) | ORAL | 0 refills | Status: DC | PRN

## 2019-06-03 NOTE — Telephone Encounter (Signed)
Spoke with the patient, instructions given. She stated that she would be here for her test. Asked to call back with any questions. EMTP

## 2019-06-03 NOTE — Progress Notes (Signed)
Safety precautions to be maintained throughout the outpatient stay will include: orient to surroundings, keep bed in low position, maintain call bell within reach at all times, provide assistance with transfer out of bed and ambulation.  

## 2019-06-04 ENCOUNTER — Other Ambulatory Visit: Payer: Self-pay

## 2019-06-04 ENCOUNTER — Ambulatory Visit (HOSPITAL_COMMUNITY): Payer: Medicare Other | Attending: Cardiology

## 2019-06-04 DIAGNOSIS — R072 Precordial pain: Secondary | ICD-10-CM

## 2019-06-04 LAB — MYOCARDIAL PERFUSION IMAGING
LV dias vol: 95 mL (ref 46–106)
LV sys vol: 36 mL
Peak HR: 74 {beats}/min
Rest HR: 54 {beats}/min
SDS: 2
SRS: 0
SSS: 2
TID: 1.04

## 2019-06-04 MED ORDER — TECHNETIUM TC 99M TETROFOSMIN IV KIT
11.0000 | PACK | Freq: Once | INTRAVENOUS | Status: AC | PRN
  Administered 2019-06-04: 11 via INTRAVENOUS
  Filled 2019-06-04: qty 11

## 2019-06-04 MED ORDER — REGADENOSON 0.4 MG/5ML IV SOLN
0.4000 mg | Freq: Once | INTRAVENOUS | Status: AC
  Administered 2019-06-04: 0.4 mg via INTRAVENOUS

## 2019-06-04 MED ORDER — TECHNETIUM TC 99M TETROFOSMIN IV KIT
31.0000 | PACK | Freq: Once | INTRAVENOUS | Status: AC | PRN
  Administered 2019-06-04: 31 via INTRAVENOUS
  Filled 2019-06-04: qty 31

## 2019-06-05 ENCOUNTER — Telehealth: Payer: Self-pay

## 2019-06-05 NOTE — Telephone Encounter (Signed)
The patient has been notified of the result and verbalized understanding.  All questions (if any) were answered. Wilma Flavin, RN 06/05/2019 10:29 AM

## 2019-06-05 NOTE — Telephone Encounter (Signed)
-----   Message from Jerline Pain, MD sent at 06/05/2019  6:35 AM EST ----- Excellent stress test. Normal study. Normal pump function. No evidence of coronary artery flow limitation.  Candee Furbish, MD

## 2019-06-07 MED FILL — Medication: INTRATHECAL | Qty: 1 | Status: AC

## 2019-06-16 ENCOUNTER — Ambulatory Visit
Admission: RE | Admit: 2019-06-16 | Discharge: 2019-06-16 | Disposition: A | Discharge status: Home / Self Care | Payer: Medicare Other | Source: Ambulatory Visit | Attending: Neurology | Admitting: Neurology

## 2019-06-16 ENCOUNTER — Other Ambulatory Visit: Payer: Self-pay

## 2019-06-16 ENCOUNTER — Other Ambulatory Visit: Payer: Self-pay | Admitting: Cardiology

## 2019-06-16 DIAGNOSIS — M5090 Cervical disc disorder, unspecified, unspecified cervical region: Secondary | ICD-10-CM | POA: Diagnosis present

## 2019-06-16 DIAGNOSIS — R278 Other lack of coordination: Secondary | ICD-10-CM | POA: Insufficient documentation

## 2019-06-16 LAB — POCT I-STAT CREATININE: Creatinine, Ser: 1.1 mg/dL — ABNORMAL HIGH (ref 0.44–1.00)

## 2019-06-16 MED ORDER — GADOBUTROL 1 MMOL/ML IV SOLN
9.0000 mL | Freq: Once | INTRAVENOUS | Status: AC | PRN
  Administered 2019-06-16: 9 mL via INTRAVENOUS

## 2019-06-18 DIAGNOSIS — T888XXA Other specified complications of surgical and medical care, not elsewhere classified, initial encounter: Secondary | ICD-10-CM | POA: Insufficient documentation

## 2019-06-20 ENCOUNTER — Other Ambulatory Visit: Payer: Self-pay | Admitting: Internal Medicine

## 2019-06-23 ENCOUNTER — Ambulatory Visit: Payer: Medicare Other | Admitting: Family Medicine

## 2019-06-23 ENCOUNTER — Encounter: Payer: Self-pay | Admitting: Pain Medicine

## 2019-06-23 ENCOUNTER — Other Ambulatory Visit: Payer: Self-pay

## 2019-06-23 ENCOUNTER — Ambulatory Visit (HOSPITAL_BASED_OUTPATIENT_CLINIC_OR_DEPARTMENT_OTHER): Payer: Medicare Other | Admitting: Pain Medicine

## 2019-06-23 ENCOUNTER — Ambulatory Visit
Admission: RE | Admit: 2019-06-23 | Discharge: 2019-06-23 | Disposition: A | Discharge status: Home / Self Care | Payer: Medicare Other | Source: Ambulatory Visit | Attending: Pain Medicine | Admitting: Pain Medicine

## 2019-06-23 VITALS — BP 115/71 | Temp 97.1°F | Resp 17 | Ht 69.0 in | Wt 218.6 lb

## 2019-06-23 DIAGNOSIS — M47816 Spondylosis without myelopathy or radiculopathy, lumbar region: Secondary | ICD-10-CM | POA: Diagnosis present

## 2019-06-23 DIAGNOSIS — M47817 Spondylosis without myelopathy or radiculopathy, lumbosacral region: Secondary | ICD-10-CM

## 2019-06-23 DIAGNOSIS — G8929 Other chronic pain: Secondary | ICD-10-CM

## 2019-06-23 DIAGNOSIS — M545 Low back pain, unspecified: Secondary | ICD-10-CM | POA: Insufficient documentation

## 2019-06-23 DIAGNOSIS — M5137 Other intervertebral disc degeneration, lumbosacral region: Secondary | ICD-10-CM | POA: Diagnosis present

## 2019-06-23 MED ORDER — LIDOCAINE HCL 2 % IJ SOLN
20.0000 mL | Freq: Once | INTRAMUSCULAR | Status: AC
  Administered 2019-06-23: 400 mg
  Filled 2019-06-23: qty 40

## 2019-06-23 MED ORDER — LACTATED RINGERS IV SOLN
1000.0000 mL | Freq: Once | INTRAVENOUS | Status: AC
  Administered 2019-06-23: 1000 mL via INTRAVENOUS

## 2019-06-23 MED ORDER — TRIAMCINOLONE ACETONIDE 40 MG/ML IJ SUSP
80.0000 mg | Freq: Once | INTRAMUSCULAR | Status: AC
  Administered 2019-06-23: 80 mg
  Filled 2019-06-23: qty 2

## 2019-06-23 MED ORDER — MIDAZOLAM HCL 5 MG/5ML IJ SOLN
1.0000 mg | INTRAMUSCULAR | Status: DC | PRN
  Administered 2019-06-23: 2 mg via INTRAVENOUS
  Filled 2019-06-23: qty 5

## 2019-06-23 MED ORDER — FENTANYL CITRATE (PF) 100 MCG/2ML IJ SOLN
25.0000 ug | INTRAMUSCULAR | Status: DC | PRN
  Administered 2019-06-23: 50 ug via INTRAVENOUS
  Filled 2019-06-23: qty 2

## 2019-06-23 MED ORDER — ROPIVACAINE HCL 2 MG/ML IJ SOLN
18.0000 mL | Freq: Once | INTRAMUSCULAR | Status: AC
  Administered 2019-06-23: 18 mL via PERINEURAL
  Filled 2019-06-23: qty 20

## 2019-06-23 NOTE — Patient Instructions (Signed)

## 2019-06-23 NOTE — Progress Notes (Signed)
PROVIDER NOTE: Information contained herein reflects review and annotations entered in association with encounter. Patient information is provided elsewhere in the medical record. Interpretation of information and data should be left to medically trained personnel. Document created using STT technology, any transcriptional errors that may result from process are unintentional. Patient's Name: Melinda [name]  MRN: [id number]  Referring Provider: Jearld Fenton, NP  DOB: [birth year]  PCP: Jearld Fenton, NP  DOS: 06/23/2019  Note by: Gaspar Cola, MD  Service setting: Ambulatory outpatient  Specialty: Interventional Pain Management  Patient type: Established  Location: [facility] (AMB) Pain Management Facility  Visit type: Interventional Procedure   Primary Reason for Visit: Interventional Pain Management Treatment. CC: Back Pain (low)  Procedure:          Anesthesia, Analgesia, Anxiolysis:  Type: Lumbar Facet, Medial Branch Block(s)          Primary Purpose: Diagnostic Region: Posterolateral Lumbosacral Spine Level: L2, L3, L4, L5, & S1 Medial Branch Level(s). Injecting these levels blocks the L3-4, L4-5, and L5-S1 lumbar facet joints. Laterality: Bilateral  Type: Moderate (Conscious) Sedation combined with Local Anesthesia Indication(s): Analgesia and Anxiety Route: Intravenous (IV) IV Access: Secured Sedation: Meaningful verbal contact was maintained at all times during the procedure  Local Anesthetic: Lidocaine 1-2%  Position: Prone   Indications: 1. Lumbar facet syndrome   2. Spondylosis without myelopathy or radiculopathy, lumbosacral region   3. Chronic low back pain (Bilateral) w/o sciatica   4. DDD (degenerative disc disease), lumbosacral    Pain Score: Pre-procedure: 4 /10 Post-procedure: 1 /10   Pre-op Assessment:  Ms. Browder is a 63 y.o. (year old), female patient, seen today for interventional treatment. 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 Ms. Pote has a current medication list which includes the following prescription(s): accu-chek aviva plus, accu-chek softclix lancets, AMBULATORY NON FORMULARY MEDICATION, aspirin, bupropion, cinnamon, co q 10, fluticasone, gentamicin ointment, glucosamine-chondroitin, magnesium, misc natural products, multivitamin, naloxone, nitroglycerin, NONFORMULARY OR COMPOUNDED ITEM, fish oil, oxycodone, [START ON 07/05/2019] oxycodone, [START ON 08/04/2019] oxycodone, paroxetine, polyethylene glycol, pregabalin, rosuvastatin, tizanidine, turmeric, vitamin e, NONFORMULARY OR COMPOUNDED ITEM, and NONFORMULARY OR COMPOUNDED ITEM, and the following Facility-Administered Medications: fentanyl and midazolam. Her primarily concern today is the Back Pain (low)  Initial Vital Signs:  Pulse/HCG Rate:  ECG Heart Rate: (!) 56 Temp: (!) 97.1 F (36.2 C) Resp: 16 BP: (!) 107/93 SpO2: 96 %  BMI: Estimated body mass index is 32.28 kg/m as calculated from the following:   Height as of this encounter: 5\' 9"  (1.753 m).   Weight as of this encounter: 218 lb 9.6 oz (99.2 kg).  Risk Assessment: Allergies: Reviewed. She has No Known Allergies.  Allergy Precautions: None required Coagulopathies: Reviewed. None identified.  Blood-thinner therapy: None at  this time Active Infection(s): Reviewed. None identified. Ms. Haseman is afebrile  Site Confirmation: Ms. Hritz was asked to confirm the procedure and laterality before marking the site Procedure checklist: Completed Consent: Before the procedure and under the  influence of no sedative(s), amnesic(s), or anxiolytics, the patient was informed of the treatment options, risks and possible complications. To fulfill our ethical and legal obligations, as recommended by the American Medical Association's Code of Ethics, I have informed the patient of my clinical impression; the nature and purpose of the treatment or procedure; the risks, benefits, and possible complications of the intervention; the alternatives, including doing nothing; the risk(s) and benefit(s) of the alternative treatment(s) or procedure(s); and the risk(s) and benefit(s) of doing nothing. The patient was provided information about the general risks and possible complications associated with the procedure. These may include, but are not limited to: failure to achieve desired goals, infection, bleeding, organ or nerve damage, allergic reactions, paralysis, and death. In addition, the patient was informed of those risks and complications associated to Spine-related procedures, such as failure to decrease pain; infection (i.e.: Meningitis, epidural or intraspinal abscess); bleeding (i.e.: epidural hematoma, subarachnoid hemorrhage, or any other type of intraspinal or peri-dural bleeding); organ or nerve damage (i.e.: Any type of peripheral nerve, nerve root, or spinal cord injury) with subsequent damage to sensory, motor, and/or autonomic systems, resulting in permanent pain, numbness, and/or weakness of one or several areas of the body; allergic reactions; (i.e.: anaphylactic reaction); and/or death. Furthermore, the patient was informed of those risks and complications associated with the medications. These include, but are not limited to:  allergic reactions (i.e.: anaphylactic or anaphylactoid reaction(s)); adrenal axis suppression; blood sugar elevation that in diabetics may result in ketoacidosis or comma; water retention that in patients with history of congestive heart failure may result in shortness of breath, pulmonary edema, and decompensation with resultant heart failure; weight gain; swelling or edema; medication-induced neural toxicity; particulate matter embolism and blood vessel occlusion with resultant organ, and/or nervous system infarction; and/or aseptic necrosis of one or more joints. Finally, the patient was informed that Medicine is not an exact science; therefore, there is also the possibility of unforeseen or unpredictable risks and/or possible complications that may result in a catastrophic outcome. The patient indicated having understood very clearly. We have given the patient no guarantees and we have made no promises. Enough time was given to the patient to ask questions, all of which were answered to the patient's satisfaction. Ms. Chesnut has indicated that she wanted to continue with the procedure. Attestation: I, the ordering provider, attest that I have discussed with the patient the benefits, risks, side-effects, alternatives, likelihood of achieving goals, and potential problems during recovery for the procedure that I have provided informed consent. Date   Time: 06/23/2019  8:04 AM  Pre-Procedure Preparation:  Monitoring: As per clinic protocol. Respiration, ETCO2, SpO2, BP, heart rate and rhythm monitor placed and checked for adequate function Safety Precautions: Patient was assessed for positional comfort and pressure points before starting the procedure. Time-out: I initiated and conducted the "Time-out" before starting the procedure, as per protocol. The patient was asked to participate by confirming the accuracy of the "Time Out" information. Verification of the correct person, site, and procedure were  performed and confirmed by me, the nursing staff, and the patient. "Time-out" conducted as per Joint Commission's Universal Protocol (UP.01.01.01). Time: 6226  Description of Procedure:          Laterality: Bilateral. The procedure was performed in identical fashion on both sides. Levels:  L2, L3, L4, L5, & S1 Medial Branch Level(s) Area Prepped: Posterior Lumbosacral Region Prepping solution: DuraPrep (Iodine Povacrylex [0.7% available iodine] and Isopropyl Alcohol, 74% w/w) Safety Precautions: Aspiration  looking for blood return was conducted prior to all injections. At no point did we inject any substances, as a needle was being advanced. Before injecting, the patient was told to immediately notify me if she was experiencing any new onset of "ringing in the ears, or metallic taste in the mouth". No attempts were made at seeking any paresthesias. Safe injection practices and needle disposal techniques used. Medications properly checked for expiration dates. SDV (single dose vial) medications used. After the completion of the procedure, all disposable equipment used was discarded in the proper designated medical waste containers. Local Anesthesia: Protocol guidelines were followed. The patient was positioned over the fluoroscopy table. The area was prepped in the usual manner. The time-out was completed. The target area was identified using fluoroscopy. A 12-in long, straight, sterile hemostat was used with fluoroscopic guidance to locate the targets for each level blocked. Once located, the skin was marked with an approved surgical skin marker. Once all sites were marked, the skin (epidermis, dermis, and hypodermis), as well as deeper tissues (fat, connective tissue and muscle) were infiltrated with a small amount of a short-acting local anesthetic, loaded on a 10cc syringe with a 25G, 1.5-in  Needle. An appropriate amount of time was allowed for local anesthetics to take effect before proceeding to the  next step. Local Anesthetic: Lidocaine 2.0% The unused portion of the local anesthetic was discarded in the proper designated containers. Technical explanation of process:  L2 Medial Branch Nerve Block (MBB): The target area for the L2 medial branch is at the junction of the postero-lateral aspect of the superior articular process and the superior, posterior, and medial edge of the transverse process of L3. Under fluoroscopic guidance, a Quincke needle was inserted until contact was made with os over the superior postero-lateral aspect of the pedicular shadow (target area). After negative aspiration for blood, 0.5 mL of the nerve block solution was injected without difficulty or complication. The needle was removed intact. L3 Medial Branch Nerve Block (MBB): The target area for the L3 medial branch is at the junction of the postero-lateral aspect of the superior articular process and the superior, posterior, and medial edge of the transverse process of L4. Under fluoroscopic guidance, a Quincke needle was inserted until contact was made with os over the superior postero-lateral aspect of the pedicular shadow (target area). After negative aspiration for blood, 0.5 mL of the nerve block solution was injected without difficulty or complication. The needle was removed intact. L4 Medial Branch Nerve Block (MBB): The target area for the L4 medial branch is at the junction of the postero-lateral aspect of the superior articular process and the superior, posterior, and medial edge of the transverse process of L5. Under fluoroscopic guidance, a Quincke needle was inserted until contact was made with os over the superior postero-lateral aspect of the pedicular shadow (target area). After negative aspiration for blood, 0.5 mL of the nerve block solution was injected without difficulty or complication. The needle was removed intact. L5 Medial Branch Nerve Block (MBB): The target area for the L5 medial branch is at the  junction of the postero-lateral aspect of the superior articular process and the superior, posterior, and medial edge of the sacral ala. Under fluoroscopic guidance, a Quincke needle was inserted until contact was made with os over the superior postero-lateral aspect of the pedicular shadow (target area). After negative aspiration for blood, 0.5 mL of the nerve block solution was injected without difficulty or complication. The needle was removed intact.  S1 Medial Branch Nerve Block (MBB): The target area for the S1 medial branch is at the posterior and inferior 6 o'clock position of the L5-S1 facet joint. Under fluoroscopic guidance, the Quincke needle inserted for the L5 MBB was redirected until contact was made with os over the inferior and postero aspect of the sacrum, at the 6 o' clock position under the L5-S1 facet joint (Target area). After negative aspiration for blood, 0.5 mL of the nerve block solution was injected without difficulty or complication. The needle was removed intact.  Nerve block solution: 0.2% PF-Ropivacaine + Triamcinolone (40 mg/mL) diluted to a final concentration of 4 mg of Triamcinolone/mL of Ropivacaine The unused portion of the solution was discarded in the proper designated containers. Procedural Needles: 22-gauge, 3.5-inch, Quincke needles used for all levels.  Once the entire procedure was completed, the treated area was cleaned, making sure to leave some of the prepping solution back to take advantage of its long term bactericidal properties.   Illustration of the posterior view of the lumbar spine and the posterior neural structures. Laminae of L2 through S1 are labeled. DPRL5, dorsal primary ramus of L5; DPRS1, dorsal primary ramus of S1; DPR3, dorsal primary ramus of L3; FJ, facet (zygapophyseal) joint L3-L4; I, inferior articular process of L4; LB1, lateral branch of dorsal primary ramus of L1; IAB, inferior articular branches from L3 medial branch (supplies L4-L5  facet joint); IBP, intermediate branch plexus; MB3, medial branch of dorsal primary ramus of L3; NR3, third lumbar nerve root; S, superior articular process of L5; SAB, superior articular branches from L4 (supplies L4-5 facet joint also); TP3, transverse process of L3.  Vitals:   06/23/19 0849 06/23/19 0855 06/23/19 0905 06/23/19 0915  BP: 95/61 95/61 108/67 115/71  Resp: 16 18 16 17   Temp:  (!) 97.1 F (36.2 C)    SpO2: 94% 96% 96% 97%  Weight:      Height:         Start Time: 0836 hrs. End Time: 0848 hrs.  Imaging Guidance (Spinal):          Type of Imaging Technique: Fluoroscopy Guidance (Spinal) Indication(s): Assistance in needle guidance and placement for procedures requiring needle placement in or near specific anatomical locations not easily accessible without such assistance. Exposure Time: Please see nurses notes. Contrast: None used. Fluoroscopic Guidance: I was personally present during the use of fluoroscopy. "Tunnel Vision Technique" used to obtain the best possible view of the target area. Parallax error corrected before commencing the procedure. "Direction-depth-direction" technique used to introduce the needle under continuous pulsed fluoroscopy. Once target was reached, antero-posterior, oblique, and lateral fluoroscopic projection used confirm needle placement in all planes. Images permanently stored in EMR. Interpretation: No contrast injected. I personally interpreted the imaging intraoperatively. Adequate needle placement confirmed in multiple planes. Permanent images saved into the patient's record.  Antibiotic Prophylaxis:   Anti-infectives (From admission, onward)   None     Indication(s): None identified  Post-operative Assessment:  Post-procedure Vital Signs:  Pulse/HCG Rate:  (!) 55 Temp: (!) 97.1 F (36.2 C) Resp: 17 BP: 115/71 SpO2: 97 %  EBL: None  Complications: No immediate post-treatment complications observed by team, or reported by  patient.  Note: The patient tolerated the entire procedure well. A repeat set of vitals were taken after the procedure and the patient was kept under observation following institutional policy, for this type of procedure. Post-procedural neurological assessment was performed, showing return to baseline, prior to discharge. The patient was provided  with post-procedure discharge instructions, including a section on how to identify potential problems. Should any problems arise concerning this procedure, the patient was given instructions to immediately contact us, at any time, without hesitation. In any case, we plan to contact the patient by telephone for a follow-up status report regarding this interventional procedure.  Comments:  No additional relevant information.  Plan of Care  Orders:  Orders Placed This Encounter  Procedures   L-FCT Blk (Today)    Scheduling Instructions:     Procedure: Lumbar facet block (AKA.: Lumbosacral medial branch nerve block)     Side: Bilateral     Level: L3-4, L4-5, & L5-S1 Facets (L2, L3, L4, L5, & S1 Medial Branch Nerves)     Sedation: Patient's choice.     Timeframe: Today    Order Specific Question:   Where will this procedure be performed?    Answer:   [facility]   Fluoro (C-Arm) (<60 min) (No Report)    Intraoperative interpretation by procedural physician at [place].    Standing Status:   Standing    Number of Occurrences:   1    Order Specific Question:   Reason for exam:    Answer:   Assistance in needle guidance and placement for procedures requiring needle placement in or near specific anatomical locations not easily accessible without such assistance.   Consent: L-FCT BLK    Nursing Order: Transcribe to consent form and obtain patient signature. Note: Always confirm laterality of pain with Ms. Chana Bode, before procedure. Procedure: Lumbar Facet Block  under fluoroscopic guidance Indication/Reason: Low Back Pain, with  our without leg pain, due to Facet Joint Arthralgia (Joint Pain) known as Lumbar Facet Syndrome, secondary to Lumbar, and/or Lumbosacral Spondylosis (Arthritis of the Spine), without myelopathy or radiculopathy (Nerve Damage). Provider Attestation: I, [place] Dossie Arbour, MD, (Pain Management Specialist), the physician/practitioner, attest that I have discussed with the patient the benefits, risks, side effects, alternatives, likelihood of achieving goals and potential problems during recovery for the procedure that I have provided informed consent.   Block Tray    Equipment required: Single use, disposable, "Block Tray"    Standing Status:   Standing    Number of Occurrences:   1    Order Specific Question:   Specify    Answer:   Block Tray   Chronic Opioid Analgesic:  Oxycodone 5 mg, 1 tab PO q 8 hrs (15 mg/day of oxycodone) + Intrathecal PF-Fentanyl MME/day: 22.5 mg/day (oral).   Medications ordered for procedure: Meds ordered this encounter  Medications   lidocaine (XYLOCAINE) 2 % (with pres) injection 400 mg   lactated ringers infusion 1,000 mL   midazolam (VERSED) 5 MG/5ML injection 1-2 mg    Make sure Flumazenil is available in the pyxis when using this medication. If oversedation occurs, administer 0.2 mg IV over 15 sec. If after 45 sec no response, administer 0.2 mg again over 1 min; may repeat at 1 min intervals; not to exceed 4 doses (1 mg)   fentaNYL (SUBLIMAZE) injection 25-50 mcg    Make sure Narcan is available in the pyxis when using this medication. In the event of respiratory depression (RR< 8/min): Titrate NARCAN (naloxone) in increments of 0.1 to 0.2 mg IV at 2-3 minute intervals, until desired degree of reversal.   ropivacaine (PF) 2 mg/mL (0.2%) (NAROPIN) injection 18 mL   triamcinolone acetonide (KENALOG-40) injection 80 mg   Medications administered: We administered lidocaine, lactated ringers, midazolam, fentaNYL, ropivacaine (  PF) 2 mg/mL (0.2%), and  triamcinolone acetonide.  See the medical record for exact dosing, route, and time of administration.  Follow-up plan:   Return in about 2 weeks (around 07/07/2019) for (VV), (PP).       Interventional treatment options:  Under consideration: Possible left lumbar facet RFA  Possible right lumbar facet RFA  Diagnostic caudal ESI + diagnostic epidurogram Possible Racz procedure Diagnostic bilateral IA knee injection (Steroid) Diagnostic bilateral genicular NB Possible bilateral genicular nerve RFA Possible bilateral Hyalgan knee injection DiagnosticcervicalESI Diagnostic bilateral cervical facet block Possible bilateral cervical facet RFA   Therapeutic/palliative (PRN): Palliative/therapeutic intrathecal pump management (refills/programming adjustments)  Palliative left L2-3 LESI #2 Palliative right lumbar facet block #4  Diagnostic left lumbar facet block #2     Recent Visits Date Type Provider Dept  06/03/19 Procedure visit [name], MD [facility]  05/06/19 Telemedicine [name], MD [facility]  Showing recent visits within past 90 days and meeting all other requirements   Today's Visits Date Type Provider Dept  06/23/19 Procedure visit [name], MD [facility]  Showing today's visits and meeting all other requirements   Future Appointments Date Type Provider Dept  07/08/19 Appointment [name], MD [facility]  08/27/19 Appointment [name], MD [facility]  Showing future appointments within next 90 days and meeting all other requirements   Disposition: Discharge home  Discharge Date & Time: 06/23/2019; 0917 hrs.   Primary Care Physician: Jearld Fenton, NP Location: [facility] Note by: Gaspar Cola, MD Date: 06/23/2019; Time: 10:00 AM  Disclaimer:  Medicine is not an Chief Strategy Officer. The only guarantee in medicine  is that nothing is guaranteed. It is important to note that the decision to proceed with this intervention was based on the information collected from the patient. The Data and conclusions were drawn from the patient's questionnaire, the interview, and the physical examination. Because the information was provided in large part by the patient, it cannot be guaranteed that it has not been purposely or unconsciously manipulated. Every effort has been made to obtain as much relevant data as possible for this evaluation. It is important to note that the conclusions that lead to this procedure are derived in large part from the available data. Always take into account that the treatment will also be dependent on availability of resources and existing treatment guidelines, considered by other Pain Management Practitioners as being common knowledge and practice, at the time of the intervention. For Medico-Legal purposes, it is also important to point out that variation in procedural techniques and pharmacological choices are the acceptable norm. The indications, contraindications, technique, and results of the above procedure should only be interpreted and judged by a Board-Certified Interventional Pain Specialist with extensive familiarity and expertise in the same exact procedure and technique.

## 2019-06-24 ENCOUNTER — Telehealth: Payer: Self-pay

## 2019-06-24 NOTE — Telephone Encounter (Signed)
Post procedure phone call. Patient states she is doing good.  

## 2019-07-02 ENCOUNTER — Other Ambulatory Visit: Payer: Self-pay | Admitting: Internal Medicine

## 2019-07-07 ENCOUNTER — Ambulatory Visit (INDEPENDENT_AMBULATORY_CARE_PROVIDER_SITE_OTHER): Payer: Medicare Other | Admitting: Family Medicine

## 2019-07-07 ENCOUNTER — Other Ambulatory Visit: Payer: Self-pay

## 2019-07-07 ENCOUNTER — Encounter: Payer: Self-pay | Admitting: Pain Medicine

## 2019-07-07 ENCOUNTER — Encounter: Payer: Self-pay | Admitting: Family Medicine

## 2019-07-07 DIAGNOSIS — M170 Bilateral primary osteoarthritis of knee: Secondary | ICD-10-CM

## 2019-07-07 NOTE — Progress Notes (Signed)
Virtual Encounter - Pain Management PROVIDER NOTE: Information contained herein reflects review and annotations entered in association with encounter. Interpretation of such information and data should be left to medically-trained personnel. Information provided to patient can be located elsewhere in the medical record under "Patient Instructions". Document created using STT-dictation technology, any transcriptional errors that may result from process are unintentional.    Contact & Pharmacy Preferred: [phone] Home: [phone] (home) Mobile: [phone] (mobile) E-mail: [email or web address]  [facility], [place] [address] [place] [address] Phone: [phone] Fax: [phone]   Pre-screening  [name] offered "in-person" vs "virtual" encounter. She indicated preferring virtual for this encounter.   Reason COVID-19*  Social distancing based on CDC and AMA recommendations.   I contacted Melinda [name] on 07/08/2019 via telephone.      I clearly identified myself as Gaspar Cola, MD. I verified that I was speaking with the correct person using two identifiers (Name: Melinda [name], and date of birth: 26-Feb-1956).  Consent I sought verbal advanced consent from Melinda [name] for virtual visit interactions. I informed Ms. Danh of possible security and privacy concerns, risks, and limitations associated with providing "not-in-person" medical evaluation and management services. I also informed Ms. Treloar of the availability of "in-person" appointments. Finally, I informed her that there would be a charge for the virtual visit and that she could be  personally, fully or partially, financially responsible for it. Ms. Baillie expressed understanding and agreed to proceed.   Historic Elements   Melinda [name] is a 64 y.o. year old, female patient evaluated today after her last encounter by our practice on 06/24/2019. Melinda [name]   has a past medical history of Amputation of great toe, right, traumatic ([place]) (05/30/2010), Amputation of second toe, right, traumatic ([place]) (09/2017), Anemia, Anxiety, Blue toes, Bulging disc, CAD (coronary artery disease) (2009), Cardiac arrhythmia due to congenital heart disease, Chicken pox, Chronic fatigue, Chronic kidney disease, Coronary arteritis, Degenerative disc disease, Depression, Diabetes mellitus without complication ([place]), Facet joint disease, Fibromyalgia, Heart disease, Hyperlipidemia, IBS (irritable bowel syndrome), MCL deficiency, knee, MRSA (methicillin resistant staph aureus) culture positive (2011), Neuropathy (04/18/2010), Peripheral neuropathy, Renal insufficiency, Restless leg syndrome, Sepsis ([place]) (09/2013), Sleep apnea (08/17/2003), and Spinal stenosis. She also  has a past surgical history that includes Foot surgery (Right); Hand surgery (Left); Gallbladder surgery; Ablation; Ablation; HEART STENT (2009); HAND SURGEY (Left); Foot surgery (Bilateral); Foot surgery (Right); Infusion pump implantation; Back surgery; Cholecystectomy (2003); Anterior cervical decomp/discectomy fusion (N/A, 07/28/2014); Carpal tunnel release (Right); Eye surgery (Bilateral, 2013); Amputation toe (Right, 02/01/2017); Irrigation and debridement foot (Right, 02/23/2017); Toe Surgery; Hammer toe surgery (Right, 10/17/2017); Intrathecal pump revision (N/A, 04/25/2018); Intrathecal pump revision (Right, 04/25/2018); Coronary angioplasty (2008); Pain pump revision (N/A, 08/15/2018); and Intrathecal pump revision (N/A, 12/12/2018). Ms. Knebel has a current medication list which includes the following prescription(s): accu-chek aviva plus, accu-chek softclix lancets, AMBULATORY NON FORMULARY MEDICATION, aspirin, bupropion, cinnamon, co q 10, fluticasone, gentamicin ointment, glucosamine-chondroitin, magnesium, misc natural products, multivitamin, naloxone, nitroglycerin, NONFORMULARY OR COMPOUNDED ITEM, NONFORMULARY OR  COMPOUNDED ITEM, NONFORMULARY OR COMPOUNDED ITEM, NONFORMULARY OR COMPOUNDED ITEM, fish oil, oxycodone, [START ON 08/04/2019] oxycodone, [START ON 09/03/2019] oxycodone, paroxetine, polyethylene glycol, pregabalin, rosuvastatin, tizanidine, turmeric, and vitamin e. She  reports that she quit smoking about 11 years ago. Her smoking use included cigarettes. She has a 48.00 pack-year smoking history. She has never used smokeless tobacco. She reports current alcohol use. She reports that she does not use  drugs. Ms. Heiny has No Known Allergies.   HPI  Today, she is being contacted for both, medication management and a post-procedure assessment.  Patient seems to have done extremely well after the bilateral lumbar facet block to the point where she no longer is experiencing the pain and tightness that she had in her lower back.  She is still having some discomfort around the anchor site for the intrathecal pump and she refers that this tends to be very aggravating and irritating when she is trying to sit for any prolonged period time.  She indicates that the skin seems to be very sensitive to touch and she still feels a little bump where the plastic anchor is located.  Today I have given her the option of trying a compounding cream to see if by any chance this can help the pain.  When she returns for her intrathecal pump I will examine the area and see what else we can do about it.  Hopefully the cream will help and we may not need to do anything invasive.  Today I also spoke to her about the long-term plan should this lumbar facet pain continue to return.  I offered to go ahead and do radiofrequency ablation and I also talked to her about when it would be recommended to do that.  In a nutshell, I told her that if the lumbar facet injections did not last 3 months or more, then she should think about the possibility of doing the radiofrequency ablation for the purpose of extending on that relief.  Just to keep me  up-to-date, she also indicated that although she is doing well with the lower back, she has continued to have some pain in the cervical region.  I have reviewed her MRI as she also has a history and evidence of facet disease in the cervical spine.  Again, she may be a good candidate for radiofrequency ablation in that region, should she choose to go that route.  For the time being, she wants to be conservative about this, which I respect.  Post-Procedure Evaluation  Procedure (06/23/2019): Diagnostic bilateral lumbar facet block #R3/L1 under fluoroscopic guidance and IV sedation Pre-procedure pain level:  4/10 Post-procedure: 1/10 (> 50% relief)  Sedation: Sedation provided.  Dewayne Shorter, RN  07/07/2019 10:22 AM  Sign when Signing Visit Pain relief after procedure (treated area only): (Questions asked to patient) 1. Starting about 15 minutes after the procedure, and "while the area was still numb" (from the local anesthetics), were you having any of your usual pain "in that area" (the treated area)?  (NOTE: NOT including the discomfort from the needle sticks.) First 1 hour: 100% better. First 4-6 hours: 100 % better. .  3. How much better is your pain now, when compared to before the procedure? Current benefit:75 % better. 4. Can you move better now? Improvement in ROM (Range of Motion): Yes. 5. Can you do more now? Improvement in function: Yes. 4. Did you have any problems with the procedure? Side-effects/Complications: No.  Current benefits: Defined as benefit that persist at this time.   Analgesia:  >75% relief Function: Melinda [name] reports improvement in function ROM: Melinda [name] reports improvement in ROM  Pharmacotherapy Assessment  Analgesic: Oxycodone 5 mg, 1 tab PO q 8 hrs (15 mg/day of oxycodone) + Intrathecal PF-Fentanyl MME/day: 22.5 mg/day (oral).   Monitoring: Pharmacotherapy: No side-effects or adverse reactions reported. [place] PMP: PDMP reviewed during this encounter.  Compliance: No problems identified. Effectiveness: Clinically acceptable. Plan: Refer to "POC".  UDS:  Summary  Date Value Ref Range Status  07/24/2018 FINAL  Final    Comment:    ==================================================================== TOXASSURE SELECT 13 (MW) ==================================================================== Test                             Result       Flag       Units Drug Present and Declared for Prescription Verification   Oxycodone                      754          EXPECTED   ng/mg creat   Oxymorphone                    231          EXPECTED   ng/mg creat   Noroxycodone                   2463         EXPECTED   ng/mg creat   Noroxymorphone                 89           EXPECTED   ng/mg creat    Sources of oxycodone are scheduled prescription medications.    Oxymorphone, noroxycodone, and noroxymorphone are expected    metabolites of oxycodone. Oxymorphone is also available as a    scheduled prescription medication. Drug Present not Declared for Prescription Verification   Fentanyl                       4            UNEXPECTED ng/mg creat   Norfentanyl                    29           UNEXPECTED ng/mg creat    Source of fentanyl is a scheduled prescription medication,    including IV, patch, and transmucosal formulations. Norfentanyl    is an expected metabolite of fentanyl. ==================================================================== Test                      Result    Flag   Units      Ref Range   Creatinine              134              mg/dL      >=20 ==================================================================== Declared Medications:  The flagging and interpretation on this report are based on the  following declared medications.  Unexpected results may arise from  inaccuracies in the declared medications.  **Note: The testing scope of this panel includes these medications:  Oxycodone  **Note: The testing scope of this  panel does not include following  reported medications:  Aspirin (Aspirin 81)  Bupropion (Wellbutrin)  Chondroitin (Glucosamine-Chondroitin)  Cinnamon  Glipizide (Glucotrol)  Glucosamine (Glucosamine-Chondroitin)  Magnesium  Multivitamin  Naloxone  Nitroglycerin (Nitrostat)  Paroxetine  Polyethylene Glycol (GlycoLAX)  Polyethylene Glycol (MiraLAX)  Pregabalin (Lyrica)  Rosuvastatin (Crestor)  Supplement (Omega-3)  Tizanidine (Zanaflex)  Turmeric  Ubiquinone (Coenzyme Q 10)  Vitamin D2 (Drisdol)  Vitamin E ==================================================================== For clinical consultation, please call [phone]. ====================================================================    Laboratory Chemistry Profile (12  mo)  Renal: 03/12/2019: BUN 24 06/16/2019: Creatinine, Ser 1.10  Lab Results  Component Value Date   GFR 51.49 (L) 03/12/2019   GFRAA >60 12/10/2018   GFRNONAA 58 (L) 12/10/2018   Hepatic: 03/12/2019: Albumin 4.3 Lab Results  Component Value Date   AST 20 03/12/2019   ALT 18 03/12/2019   Other: 03/12/2019: Vitamin B-12 253; VITD 34.99 Note: Above Lab results reviewed.  Imaging  Fluoro (C-Arm) (<60 min) (No Report) Fluoro was used, but no Radiologist interpretation will be provided.  Please refer to "NOTES" tab for provider progress note.   Assessment  The primary encounter diagnosis was Chronic pain syndrome. Diagnoses of Chronic low back pain (Bilateral) w/o sciatica, Chronic knee pain (Primary Area of Pain) (Right), Presence of functional implant (Medtronic programmable intrathecal pump) (Right abdominal area), Sensation disturbance of skin, Allodynia, and Hyperalgesia were also pertinent to this visit.  Plan of Care  Problem-specific:  No problem-specific Assessment & Plan notes found for this encounter.  I am having Melinda [name] start on NONFORMULARY OR COMPOUNDED ITEM. I am also having her maintain her polyethylene glycol,  aspirin, AMBULATORY NON FORMULARY MEDICATION, Fish Oil, VITAMIN E PO, glucosamine-chondroitin, multivitamin, Co Q 10, NONFORMULARY OR COMPOUNDED ITEM, NONFORMULARY OR COMPOUNDED ITEM, NONFORMULARY OR COMPOUNDED ITEM, Turmeric, Misc Natural Products (TART CHERRY ADVANCED PO), Magnesium, Cinnamon, naloxone, Accu-Chek Softclix Lancets, PARoxetine, nitroGLYCERIN, fluticasone, gentamicin ointment, tiZANidine, pregabalin, oxyCODONE, buPROPion, oxyCODONE, rosuvastatin, Accu-Chek Aviva Plus, and oxyCODONE.  Pharmacotherapy (Medications Ordered): Meds ordered this encounter  Medications  . oxyCODONE (OXY IR/ROXICODONE) 5 MG immediate release tablet    Sig: Take 1 tablet (5 mg total) by mouth every 8 (eight) hours as needed for severe pain. Must last 30 days.    Dispense:  90 tablet    Refill:  0    Chronic Pain: STOP Act (Not applicable) Fill 1 day early if closed on refill date. Do not fill until: 09/03/2019. To last until: 10/03/2019. Avoid benzodiazepines within 8 hours of opioids  . NONFORMULARY OR COMPOUNDED ITEM    Sig: Sig: Apply 1-2 gm(s) (2-4 pumps) to affected area, 3-4 times/day. (1 pump = 0.5 gm)    Dispense:  1 each    Refill:  2    Compounded cream: 2.5% Lidocaine, 10% Ketamine, 10% Ketoprofen, 6% Gabapentin  Dispense: 120 gm Pump Bottle. (Dispenser: 1 pump = 0.5 gm.)   Orders:  No orders of the defined types were placed in this encounter.  Follow-up plan:   Return for regular appointment for her intrathecal pump refill, and to evaluate the compounded cream.      Interventional treatment options:  Under consideration: Possible left lumbar facet RFA  Possible right lumbar facet RFA  Diagnostic caudal ESI + diagnostic epidurogram Possible Racz procedure Diagnostic bilateral IA knee injection (Steroid) Diagnostic bilateral genicular NB Possible bilateral genicular nerve RFA Possible bilateral Hyalgan knee injection DiagnosticcervicalESI Diagnostic bilateral cervical facet  block Possible bilateral cervical facet RFA   Therapeutic/palliative (PRN): Palliative/therapeutic intrathecal pump management (refills/programming adjustments)  Palliative left L2-3 LESI #2 Palliative right lumbar facet block #4  Diagnostic left lumbar facet block #2     Recent Visits Date Type Provider Dept  06/23/19 Procedure visit [name], MD [facility]  06/03/19 Procedure visit [name], MD [facility]  05/06/19 Telemedicine [name], MD [facility]  Showing recent visits within past 90 days and meeting all other requirements   Today's Visits Date Type Provider Dept  07/08/19 Telemedicine [name], MD [facility]  Showing today's visits and meeting all other requirements   Future Appointments Date Type Provider Dept  08/27/19 Appointment [name], MD [facility]  Showing future appointments within next 90 days and meeting all other requirements   I discussed the assessment and treatment plan with the patient. The patient was provided an opportunity to ask questions and all were answered. The patient agreed with the plan and demonstrated an understanding of the instructions.  Patient advised to call back or seek an in-person evaluation if the symptoms or condition worsens.  Total duration of non-face-to-face encounter: 18 minutes.  Note by: Gaspar Cola, MD Date: 07/08/2019; Time: 2:36 PM

## 2019-07-07 NOTE — Progress Notes (Signed)
Pain relief after procedure (treated area only): (Questions asked to patient) 1. Starting about 15 minutes after the procedure, and "while the area was still numb" (from the local anesthetics), were you having any of your usual pain "in that area" (the treated area)?  (NOTE: NOT including the discomfort from the needle sticks.) First 1 hour: 100% better. First 4-6 hours: 100 % better. .  3. How much better is your pain now, when compared to before the procedure? Current benefit:75 % better. 4. Can you move better now? Improvement in ROM (Range of Motion): Yes. 5. Can you do more now? Improvement in function: Yes. 4. Did you have any problems with the procedure? Side-effects/Complications: No.

## 2019-07-07 NOTE — Patient Instructions (Signed)
See me when you need me

## 2019-07-07 NOTE — Progress Notes (Signed)
[facility] [address] [place] Phone: [phone] Subjective:   I Melinda [name] am serving as a Education administrator for Dr. Hulan Saas.  This visit occurred during the SARS-CoV-2 public health emergency.  Safety protocols were in place, including screening questions prior to the visit, additional usage of staff PPE, and extensive cleaning of exam room while observing appropriate contact time as indicated for disinfecting solutions.   CC: Right knee pain  [id number]   05/15/2019 Patient has failed conservative therapy.  Was given Monovisc previously and has had some improvement.  Attempted it today.  Discussed icing regimen, discussed only thing left is PRP is a possibility.  Increase activity as tolerated.  Follow-up again in 4 to 8 weeks.  07/07/2019 Melinda [name] is a 64 y.o. female coming in with complaint of right knee pain. Patient sates she is feeling better. Knee doesn't feel unstable. Painful with cold weather.  Patient states overall this is the patient's multisport complaint.  Is making progress.  Feels like she is doing well.  Patient is recently even started her woodworking on a more regular basis and feels that that has been significantly helpful.      Past Medical History:  Diagnosis Date  . 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 Amputation of second toe, right, traumatic ([place]) 09/2017  . Anemia    years ago  . Anxiety   . Blue toes    2nd toe on right foot, will get appt.  . Bulging disc   . CAD (coronary artery disease) 2009   s/p stent to LAD  . 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  Ablations done.  Now has rare episodes  . Chicken pox   . Chronic fatigue   . Chronic kidney disease    "problem with kidney filtration"  . Coronary arteritis   . Degenerative disc disease   . Depression   . Diabetes mellitus without complication ([place])   . Facet joint disease   .  Fibromyalgia   . Heart disease   . Hyperlipidemia   . IBS (irritable bowel syndrome)   . MCL deficiency, knee   . MRSA (methicillin resistant staph aureus) culture positive 2011   GREAT TOE RIGHT FOOT  . Neuropathy 04/18/2010  . Peripheral neuropathy   . Renal insufficiency   . Restless leg syndrome   . Sepsis ([place]) 09/2013  . Sleep apnea 08/17/2003   uses CPAP, sleep study at [facility] (mild to moderate)  . Spinal stenosis    Past Surgical History:  Procedure Laterality Date  . ABLATION     UTERUS  . ABLATION     HEART  . AMPUTATION TOE Right 02/01/2017   Procedure: AMPUTATION TOE-RIGHT 2ND MPJ;  Surgeon: Albertine Patricia, DPM;  Location: [facility];  Service: Podiatry;  Laterality: Right;  . ANTERIOR CERVICAL DECOMP/DISCECTOMY FUSION N/A 07/28/2014   Procedure: ANTERIOR CERVICAL DECOMPRESSION/DISCECTOMY FUSION CERVICAL 3-4,4-5,5-6 LEVELS WITH INSTRUMENTATION AND ALLOGRAFT;  Surgeon: Sinclair Ship, MD;  Location: [facility];  Service: Orthopedics;  Laterality: N/A;  Anterior cervical decompression fusion, cervical 3-4, cervical 4-5, cervical 5-6 with instrumentation and allograft  . BACK SURGERY     X 3 1979, 1994, 1995  . CARPAL TUNNEL RELEASE Right   . CHOLECYSTECTOMY  2003  . CORONARY ANGIOPLASTY  2008  . EYE SURGERY Bilateral 2013   Eyelid lift   . FOOT SURGERY Right    BIG TOE  .  FOOT SURGERY Bilateral    PLANTAR FASCIITIS  . FOOT SURGERY Right    2ND TOE  . GALLBLADDER SURGERY    . HAMMER TOE SURGERY Right 10/17/2017   Procedure: HAMMER TOE CORRECTION-4TH TOE;  Surgeon: Albertine Patricia, DPM;  Location: [facility];  Service: Podiatry;  Laterality: Right;  LMA- WITH LOCAL Diabetic - diet controlled  . HAND SURGERY Left   . HAND SURGEY Left   . HEART STENT  2009   LAD  . INFUSION PUMP IMPLANTATION     X2 with morphine and baclofen  . INTRATHECAL PUMP REVISION N/A 04/25/2018   Procedure: Intrathecal pump replacement;  Surgeon: Clydell Hakim, MD;   Location: [place];  Service: Neurosurgery;  Laterality: N/A;  right  . INTRATHECAL PUMP REVISION Right 04/25/2018  . INTRATHECAL PUMP REVISION N/A 12/12/2018   Procedure: Intrathecal pump revision with exploration of pocket;  Surgeon: Clydell Hakim, MD;  Location: [place];  Service: Neurosurgery;  Laterality: N/A;  Intrathecal pump revision with exploration of pocket  . IRRIGATION AND DEBRIDEMENT FOOT Right 02/23/2017   Procedure: IRRIGATION AND DEBRIDEMENT FOOT-right foot;  Surgeon: Samara Deist, DPM;  Location: [facility];  Service: Podiatry;  Laterality: Right;  . PAIN PUMP REVISION N/A 08/15/2018   Procedure: Intrathecal PUMP REVISION;  Surgeon: Clydell Hakim, MD;  Location: [place];  Service: Neurosurgery;  Laterality: N/A;  INTRATHECAL PUMP REVISION  . TOE SURGERY     then revision 8/18   Social History   Socioeconomic History  . Marital status: Significant Other    Spouse name: Not on file  . Number of children: Not on file  . Years of education: Not on file  . Highest education level: Not on file  Occupational History  . Not on file  Tobacco Use  . Smoking status: Former Smoker    Packs/day: 1.50    Years: 32.00    Pack years: 48.00    Types: Cigarettes    Quit date: 07/22/2007    Years since quitting: 11.9  . Smokeless tobacco: Never Used  Substance and Sexual Activity  . Alcohol use: Yes    Alcohol/week: 0.0 standard drinks    Comment: occ - Holidays  . Drug use: No  . Sexual activity: Yes  Other Topics Concern  . Not on file  Social History Narrative   Lives at home with a partner. Independent at baseline   Social Determinants of Health   Financial Resource Strain:   . Difficulty of Paying Living Expenses: Not on file  Food Insecurity:   . Worried About Charity fundraiser in the Last Year: Not on file  . Ran Out of Food in the Last Year: Not on file  Transportation Needs:   . Lack of Transportation (Medical): Not on file  . Lack of Transportation (Non-Medical): Not  on file  Physical Activity:   . Days of Exercise per Week: Not on file  . Minutes of Exercise per Session: Not on file  Stress:   . Feeling of Stress : Not on file  Social Connections:   . Frequency of Communication with Friends and Family: Not on file  . Frequency of Social Gatherings with Friends and Family: Not on file  . Attends Religious Services: Not on file  . Active Member of Clubs or Organizations: Not on file  . Attends Archivist Meetings: Not on file  . Marital Status: Not on file   No Known Allergies Family History  Problem Relation Age of  Onset  . Lung cancer Mother   . Diabetes Father   . Heart disease Maternal Grandfather   . Diabetes Paternal Grandmother   . Colon cancer Paternal Grandfather   . Stroke Neg Hx      Current Outpatient Medications (Cardiovascular):  .  nitroGLYCERIN (NITROSTAT) 0.4 MG SL tablet, Place 1 tablet (0.4 mg total) under the tongue every 5 (five) minutes as needed for chest pain. Please keep upcoming appt in October. Thank you .  rosuvastatin (CRESTOR) 20 MG tablet, TAKE 1 TABLET BY MOUTH EVERY DAY  Current Outpatient Medications (Respiratory):  .  fluticasone (FLONASE) 50 MCG/ACT nasal spray, Place 2 sprays into both nostrils daily.  Current Outpatient Medications (Analgesics):  .  aspirin 81 MG tablet, Take 81 mg by mouth daily. Marland Kitchen  oxyCODONE (OXY IR/ROXICODONE) 5 MG immediate release tablet, Take 1 tablet (5 mg total) by mouth every 8 (eight) hours as needed for severe pain. Must last 30 days. Derrill Memo ON 08/04/2019] oxyCODONE (OXY IR/ROXICODONE) 5 MG immediate release tablet, Take 1 tablet (5 mg total) by mouth every 8 (eight) hours as needed for severe pain. Must last 30 days. Marland Kitchen  oxyCODONE (OXY IR/ROXICODONE) 5 MG immediate release tablet, Take 1 tablet (5 mg total) by mouth every 8 (eight) hours as needed for severe pain. Must last 30 days.   Current Outpatient Medications (Other):  Marland Kitchen  ACCU-CHEK AVIVA PLUS test strip, USE  TO CHECK BLOOD SUGAR ONE TIME A DAY .  ACCU-CHEK SOFTCLIX LANCETS lancets, USE TO CHECK BLOOD SUGAR 1 TIME DAILY. DX: E11.42 .  AMBULATORY NON FORMULARY MEDICATION, Medication Name: CPAP MASK OF CHOICE FOR HOME DEVICE .  buPROPion (WELLBUTRIN XL) 150 MG 24 hr tablet, TAKE 1 TABLET BY MOUTH EVERY DAY .  Cinnamon 500 MG TABS, Take 1,000 mg by mouth daily. .  Coenzyme Q10 (CO Q 10) 100 MG CAPS, Take 100 mg by mouth daily.  Marland Kitchen  gentamicin ointment (GARAMYCIN) 0.1 %, APPLY TO NOSE THREE TIMES DAILY .  glucosamine-chondroitin 500-400 MG tablet, Take 2 tablets by mouth daily.  .  Magnesium 250 MG TABS, Take 250 mg by mouth daily. .  Misc Natural Products (TART CHERRY ADVANCED PO), Take 1,200 mg by mouth at bedtime.  .  Multiple Vitamin (MULTIVITAMIN) tablet, Take 1 tablet by mouth daily. .  naloxone (NARCAN) 2 MG/2ML injection, Inject 1 mL (1 mg total) into the muscle as needed for up to 2 doses (for opioid overdose). Inject content of syringe into thigh muscle. Call 911. .  NONFORMULARY OR COMPOUNDED ITEM, 212.8 mcg by Epidural Infusion route. Medtronic Neuromodulation pump Fentanyl 212.26mcg/day via IT pump, Baclofen, bupivicaine .  NONFORMULARY OR COMPOUNDED ITEM, 88.77 mcg by Epidural Infusion route Continuous EPIDURAL. Medtronic Neuromodulation pump: Fentanyl, baclofen 88.77, bupivicane .  NONFORMULARY OR COMPOUNDED ITEM, 5.918 mg by Epidural Infusion route Continuous EPIDURAL. Medtronic Neuromodulation pump: Fentanyl, Baclofen, Bupivicaine 5.918 mg .  Omega-3 Fatty Acids (FISH OIL) 1000 MG CAPS, Take 2,000 mg by mouth 2 (two) times daily.  Marland Kitchen  PARoxetine (PAXIL) 20 MG tablet, TAKE ONE-HALF OF A TABLET BY MOUTH DAILY IN THE MORNING .  polyethylene glycol (MIRALAX / GLYCOLAX) packet, Take 17 g by mouth daily.  .  pregabalin (LYRICA) 150 MG capsule, Take 1 capsule (150 mg total) by mouth 3 (three) times daily. Marland Kitchen  tiZANidine (ZANAFLEX) 4 MG tablet, Take 1 tablet (4 mg total) by mouth 3 (three) times  daily. .  Turmeric 500 MG TABS, Take 500 mg by  mouth 2 (two) times daily.  Marland Kitchen  VITAMIN E PO, Take 450 Units by mouth daily.     Past medical history, social, surgical and family history all reviewed in electronic medical record.  No pertanent information unless stated regarding to the chief complaint.   Review of Systems:  No headache, visual changes, nausea, vomiting, diarrhea, constipation, dizziness, abdominal pain, skin rash, fevers, chills, night sweats, weight loss, swollen lymph nodes, body aches, joint swelling, muscle aches, chest pain, shortness of breath, mood changes.   Objective  Blood pressure 132/84, pulse 62, height 5\' 9"  (1.753 m), weight 213 lb (96.6 kg), SpO2 96 %. Systems examined below as of    General: No apparent distress alert and oriented x3 mood and affect normal, dressed appropriately.  HEENT: Pupils equal, extraocular movements intact  Respiratory: Patient's speak in full sentences and does not appear short of breath  Cardiovascular: Trace lower extremity edema, non tender, no erythema  Skin: Warm dry intact with no signs of infection or rash on extremities or on axial skeleton.  Abdomen: Soft nontender  Neuro: Cranial nerves II through XII are intact, neurovascularly intact in all extremities with 2+ DTRs and 2+ pulses.  Lymph: No lymphadenopathy of posterior or anterior cervical chain or axillae bilaterally.  Gait antalgic MSK: Knee: Right valgus deformity noted.  Abnormal thigh to calf ratio.  Tender to palpation over medial and PF joint line.  ROM full in flexion and extension and lower leg rotation. instability with valgus force.  painful patellar compression. Patellar glide with moderate crepitus. Patellar and quadriceps tendons unremarkable. Hamstring and quadriceps strength is normal. Contralateral knee shows arthritic changes as well but not as tender    Impression and Recommendations:      The above documentation has been reviewed and is  accurate and complete Lyndal Pulley, DO       Note: This dictation was prepared with Dragon dictation along with smaller phrase technology. Any transcriptional errors that result from this process are unintentional.

## 2019-07-07 NOTE — Assessment & Plan Note (Signed)
Significant improvement at this time.  No need to make any significant changes otherwise.  Patient will follow up with me on an as-needed basis.

## 2019-07-08 ENCOUNTER — Ambulatory Visit: Payer: Medicare Other | Attending: Pain Medicine | Admitting: Pain Medicine

## 2019-07-08 DIAGNOSIS — R208 Other disturbances of skin sensation: Secondary | ICD-10-CM | POA: Insufficient documentation

## 2019-07-08 DIAGNOSIS — G8929 Other chronic pain: Secondary | ICD-10-CM

## 2019-07-08 DIAGNOSIS — M25561 Pain in right knee: Secondary | ICD-10-CM | POA: Diagnosis not present

## 2019-07-08 DIAGNOSIS — Z969 Presence of functional implant, unspecified: Secondary | ICD-10-CM

## 2019-07-08 DIAGNOSIS — R209 Unspecified disturbances of skin sensation: Secondary | ICD-10-CM

## 2019-07-08 DIAGNOSIS — M545 Low back pain, unspecified: Secondary | ICD-10-CM

## 2019-07-08 DIAGNOSIS — G894 Chronic pain syndrome: Secondary | ICD-10-CM | POA: Diagnosis not present

## 2019-07-08 MED ORDER — OXYCODONE HCL 5 MG PO TABS: 5.0000 mg | ORAL_TABLET | Freq: Three times a day (TID) | ORAL | 0 refills | Status: DC | PRN

## 2019-07-08 MED ORDER — NONFORMULARY OR COMPOUNDED ITEM: 2 refills | Status: AC

## 2019-07-09 ENCOUNTER — Encounter: Payer: Self-pay | Admitting: Emergency Medicine

## 2019-07-09 ENCOUNTER — Emergency Department: Payer: Medicare Other

## 2019-07-09 ENCOUNTER — Emergency Department
Admission: EM | Admit: 2019-07-09 | Discharge: 2019-07-09 | Disposition: A | Discharge status: Home / Self Care | Payer: Medicare Other | Attending: Emergency Medicine | Admitting: Emergency Medicine

## 2019-07-09 ENCOUNTER — Other Ambulatory Visit: Payer: Self-pay

## 2019-07-09 DIAGNOSIS — W5551XA Bitten by raccoon, initial encounter: Secondary | ICD-10-CM | POA: Diagnosis not present

## 2019-07-09 DIAGNOSIS — Z23 Encounter for immunization: Secondary | ICD-10-CM | POA: Insufficient documentation

## 2019-07-09 DIAGNOSIS — Y929 Unspecified place or not applicable: Secondary | ICD-10-CM | POA: Insufficient documentation

## 2019-07-09 DIAGNOSIS — Y939 Activity, unspecified: Secondary | ICD-10-CM | POA: Insufficient documentation

## 2019-07-09 DIAGNOSIS — N183 Chronic kidney disease, stage 3 unspecified: Secondary | ICD-10-CM | POA: Diagnosis not present

## 2019-07-09 DIAGNOSIS — E1122 Type 2 diabetes mellitus with diabetic chronic kidney disease: Secondary | ICD-10-CM | POA: Diagnosis not present

## 2019-07-09 DIAGNOSIS — Z2914 Encounter for prophylactic rabies immune globin: Secondary | ICD-10-CM | POA: Insufficient documentation

## 2019-07-09 DIAGNOSIS — Y999 Unspecified external cause status: Secondary | ICD-10-CM | POA: Diagnosis not present

## 2019-07-09 DIAGNOSIS — S61252A Open bite of right middle finger without damage to nail, initial encounter: Secondary | ICD-10-CM | POA: Diagnosis not present

## 2019-07-09 DIAGNOSIS — Z203 Contact with and (suspected) exposure to rabies: Secondary | ICD-10-CM | POA: Diagnosis not present

## 2019-07-09 DIAGNOSIS — Z7982 Long term (current) use of aspirin: Secondary | ICD-10-CM | POA: Insufficient documentation

## 2019-07-09 DIAGNOSIS — Z79899 Other long term (current) drug therapy: Secondary | ICD-10-CM | POA: Diagnosis not present

## 2019-07-09 DIAGNOSIS — Z87892 Personal history of anaphylaxis: Secondary | ICD-10-CM | POA: Insufficient documentation

## 2019-07-09 DIAGNOSIS — T148XXA Other injury of unspecified body region, initial encounter: Secondary | ICD-10-CM

## 2019-07-09 DIAGNOSIS — I2510 Atherosclerotic heart disease of native coronary artery without angina pectoris: Secondary | ICD-10-CM | POA: Diagnosis not present

## 2019-07-09 DIAGNOSIS — S61250A Open bite of right index finger without damage to nail, initial encounter: Secondary | ICD-10-CM | POA: Diagnosis not present

## 2019-07-09 DIAGNOSIS — S61451A Open bite of right hand, initial encounter: Secondary | ICD-10-CM | POA: Insufficient documentation

## 2019-07-09 MED ORDER — RABIES IMMUNE GLOBULIN 150 UNIT/ML IM INJ
20.0000 [IU]/kg | INJECTION | Freq: Once | INTRAMUSCULAR | Status: AC
  Administered 2019-07-09: 22:00:00 1950 [IU] via INTRAMUSCULAR
  Filled 2019-07-09: qty 13

## 2019-07-09 MED ORDER — TETANUS-DIPHTH-ACELL PERTUSSIS 5-2.5-18.5 LF-MCG/0.5 IM SUSP
0.5000 mL | Freq: Once | INTRAMUSCULAR | Status: AC
  Administered 2019-07-09: 20:00:00 0.5 mL via INTRAMUSCULAR
  Filled 2019-07-09: qty 0.5

## 2019-07-09 MED ORDER — AMOXICILLIN-POT CLAVULANATE 875-125 MG PO TABS
1.0000 | ORAL_TABLET | Freq: Once | ORAL | Status: AC
  Administered 2019-07-09: 20:00:00 1 via ORAL
  Filled 2019-07-09: qty 1

## 2019-07-09 MED ORDER — RABIES VACCINE, PCEC IM SUSR
1.0000 mL | Freq: Once | INTRAMUSCULAR | Status: AC
  Administered 2019-07-09: 1 mL via INTRAMUSCULAR
  Filled 2019-07-09: qty 1

## 2019-07-09 MED ORDER — AMOXICILLIN-POT CLAVULANATE 875-125 MG PO TABS: 1.0000 | ORAL_TABLET | Freq: Two times a day (BID) | ORAL | 0 refills | Status: AC

## 2019-07-09 NOTE — ED Notes (Signed)
Pharmacy called about rabies immune globulin dose. Pharmacy states it will be delivered shortly.

## 2019-07-09 NOTE — ED Notes (Signed)
Pt's right hand dressed with non stick dressing and kerlex. Pt states understanding of wound care.

## 2019-07-09 NOTE — ED Provider Notes (Signed)
[facility] Emergency Department Provider Note ____________________________________________  Time seen: 2008  I have reviewed the triage vital signs and the nursing notes.  HISTORY  Chief Complaint  Animal Bite  HPI Melinda [name] is a 64 y.o. female presents to the ED for evaluation of animal bite to the right fingers.  Patient admittedly approached a raccoon that appeared to be trapped in some brush in her yard.  As she grabbed the raccoon by the scrub for the neck, she apparently slipped and fell, landing on her buttocks, and the raccoon bit her second and third fingers on the right hand.   The raccoon escaped, the patient presents to the ED for evaluation of her injuries.  She is also requesting a bit of a tetanus status, and is inclined to take the rabies vaccine for postexposure prophylaxis.  Past Medical History:  Diagnosis Date  . Amputation of great toe, right, traumatic ([place]) 05/30/2010  . Amputation of second toe, right, traumatic ([place]) 09/2017  . Anemia    years ago  . Anxiety   . Blue toes    2nd toe on right foot, will get appt.  . Bulging disc   . CAD (coronary artery disease) 2009   s/p stent to LAD  . 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  Ablations done.  Now has rare episodes  . Chicken pox   . Chronic fatigue   . Chronic kidney disease    "problem with kidney filtration"  . Coronary arteritis   . Degenerative disc disease   . Depression   . Diabetes mellitus without complication ([place])   . Facet joint disease   . Fibromyalgia   . Heart disease   . Hyperlipidemia   . IBS (irritable bowel syndrome)   . MCL deficiency, knee   . MRSA (methicillin resistant staph aureus) culture positive 2011   GREAT TOE RIGHT FOOT  . Neuropathy 04/18/2010  . Peripheral neuropathy   . Renal insufficiency   . Restless leg syndrome   . Sepsis ([place]) 09/2013  . Sleep apnea 08/17/2003   uses CPAP, sleep study at [facility]  (mild to moderate)  . Spinal stenosis     Patient Active Problem List   Diagnosis Date Noted  . Sensation disturbance of skin 07/08/2019  . Allodynia 07/08/2019  . Hyperalgesia 07/08/2019  . Chronic low back pain (Bilateral) w/o sciatica 06/23/2019  . DDD (degenerative disc disease), lumbosacral 06/23/2019  . 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 Spondylosis without myelopathy or radiculopathy, lumbosacral region 11/05/2018  . CKD (chronic kidney disease) stage 3, GFR 30-59 ml/min 09/04/2018  . 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 08/06/2018  . DDD (degenerative disc disease), lumbar 05/15/2018  . Abnormal MRI, lumbar spine (04/01/2018) 05/06/2018  . Lumbar facet hypertrophy (Multilevel) 05/06/2018  . Lumbar foraminal stenosis 05/06/2018  . Lumbar lateral recess stenosis 05/06/2018  . Lumbar spinal stenosis w/ neurogenic claudication 04/09/2018  . Degenerative arthritis of knee (Bilateral) 03/12/2018  . Lumbar spondylosis 01/23/2018  . Hip pain, acute, left 01/01/2018  . Other secondary hypertension 08/09/2017  . OSA (obstructive sleep apnea) 08/09/2017  . Osteoarthritis of knee (Bilateral) (R>L) 06/08/2016  . Long term current use of opiate analgesic 04/21/2016  . Long term prescription opiate use 04/21/2016  . Opiate use 04/21/2016  . Encounter for adjustment or management of infusion pump 04/21/2016  . Osteoarthritis, multiple sites 04/21/2016  . Chronic musculoskeletal pain  04/21/2016  . Anxiety and depression 09/11/2015  . Opioid-induced constipation (OIC) 07/27/2015  . Chronic knee pain (Primary Area of Pain) (Right) 07/27/2015  . Osteoarthritis of knee (Left) 07/27/2015  . 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 Chronic neck pain 05/24/2015  . Chronic low back pain (Bilateral) (L>R) w/ sciatica (Right) 05/24/2015  . Epidural fibrosis 05/24/2015  . Cervical  spondylosis 05/24/2015  . Neuropathic pain 05/24/2015  . Lumbar facet syndrome 05/24/2015  . Fibromyalgia 05/24/2015  . Presence of functional implant (Medtronic programmable intrathecal pump) (Right abdominal area) 05/24/2015  . Presence of intrathecal pump (Medtronic intrathecal programmable pump) (40 mL pump) 05/24/2015  . Cervical spondylosis 05/24/2015  . Type 2 diabetes mellitus with diabetic mononeuropathy, without long-term current use of insulin ([place]) 04/27/2014  . Pure hypercholesterolemia 03/02/2014  . CAD (coronary artery disease) 10/04/2013  . Atherosclerotic heart disease of native coronary artery without angina pectoris 10/04/2013  . Chronic pain syndrome 04/18/2010    Past Surgical History:  Procedure Laterality Date  . ABLATION     UTERUS  . ABLATION     HEART  . AMPUTATION TOE Right 02/01/2017   Procedure: AMPUTATION TOE-RIGHT 2ND MPJ;  Surgeon: Albertine Patricia, DPM;  Location: [facility] ORS;  Service: Podiatry;  Laterality: Right;  . ANTERIOR CERVICAL DECOMP/DISCECTOMY FUSION N/A 07/28/2014   Procedure: ANTERIOR CERVICAL DECOMPRESSION/DISCECTOMY FUSION CERVICAL 3-4,4-5,5-6 LEVELS WITH INSTRUMENTATION AND ALLOGRAFT;  Surgeon: Sinclair Ship, MD;  Location: [place];  Service: Orthopedics;  Laterality: N/A;  Anterior cervical decompression fusion, cervical 3-4, cervical 4-5, cervical 5-6 with instrumentation and allograft  . BACK SURGERY     X 3 1979, 1994, 1995  . CARPAL TUNNEL RELEASE Right   . CHOLECYSTECTOMY  2003  . CORONARY ANGIOPLASTY  2008  . EYE SURGERY Bilateral 2013   Eyelid lift   . FOOT SURGERY Right    BIG TOE  . FOOT SURGERY Bilateral    PLANTAR FASCIITIS  . FOOT SURGERY Right    2ND TOE  . GALLBLADDER SURGERY    . HAMMER TOE SURGERY Right 10/17/2017   Procedure: HAMMER TOE CORRECTION-4TH TOE;  Surgeon: Albertine Patricia, DPM;  Location: [place];  Service: Podiatry;  Laterality: Right;  LMA- WITH LOCAL Diabetic - diet controlled  . HAND  SURGERY Left   . HAND SURGEY Left   . HEART STENT  2009   LAD  . INFUSION PUMP IMPLANTATION     X2 with morphine and baclofen  . INTRATHECAL PUMP REVISION N/A 04/25/2018   Procedure: Intrathecal pump replacement;  Surgeon: Clydell Hakim, MD;  Location: [place];  Service: Neurosurgery;  Laterality: N/A;  right  . INTRATHECAL PUMP REVISION Right 04/25/2018  . INTRATHECAL PUMP REVISION N/A 12/12/2018   Procedure: Intrathecal pump revision with exploration of pocket;  Surgeon: Clydell Hakim, MD;  Location: [place];  Service: Neurosurgery;  Laterality: N/A;  Intrathecal pump revision with exploration of pocket  . IRRIGATION AND DEBRIDEMENT FOOT Right 02/23/2017   Procedure: IRRIGATION AND DEBRIDEMENT FOOT-right foot;  Surgeon: Samara Deist, DPM;  Location: [facility];  Service: Podiatry;  Laterality: Right;  . PAIN PUMP REVISION N/A 08/15/2018   Procedure: Intrathecal PUMP REVISION;  Surgeon: Clydell Hakim, MD;  Location: [place];  Service: Neurosurgery;  Laterality: N/A;  INTRATHECAL PUMP REVISION  . TOE SURGERY     then revision 8/18    Prior to Admission medications   Medication Sig Start Date End Date Taking? Authorizing Provider  ACCU-CHEK AVIVA PLUS test strip USE TO CHECK BLOOD SUGAR ONE TIME A DAY 07/02/19   Baity, Coralie Keens, NP  ACCU-CHEK SOFTCLIX LANCETS lancets USE TO CHECK BLOOD SUGAR 1 TIME DAILY. DX: O11.57 09/01/18   Jearld Fenton, NP  AMBULATORY NON FORMULARY MEDICATION Medication Name: CPAP MASK OF CHOICE FOR HOME DEVICE 07/01/15   Laverle Hobby, MD  amoxicillin-clavulanate (AUGMENTIN) 875-125 MG tablet Take 1 tablet by mouth 2 (two) times daily for 10 days. 07/10/19 07/20/19  Guilherme Schwenke, Dannielle Karvonen, PA-C  aspirin 81 MG tablet Take 81 mg by mouth daily.    [provider]  buPROPion (WELLBUTRIN XL) 150 MG 24 hr tablet TAKE 1 TABLET BY MOUTH EVERY DAY 05/19/19   Baity, Coralie Keens, NP  Cinnamon 500 MG TABS Take 1,000 mg by mouth daily.    [provider]  Coenzyme  Q10 (CO Q 10) 100 MG CAPS Take 100 mg by mouth daily.     [provider]  fluticasone (FLONASE) 50 MCG/ACT nasal spray Place 2 sprays into both nostrils daily. 03/16/19   [provider]  gentamicin ointment (GARAMYCIN) 0.1 % APPLY TO NOSE THREE TIMES DAILY 03/16/19   [provider]  glucosamine-chondroitin 500-400 MG tablet Take 2 tablets by mouth daily.     [provider]  Magnesium 250 MG TABS Take 250 mg by mouth daily.    [provider]  Misc Natural Products (TART CHERRY ADVANCED PO) Take 1,200 mg by mouth at bedtime.     [provider]  Multiple Vitamin (MULTIVITAMIN) tablet Take 1 tablet by mouth daily.    [provider]  naloxone [facility]) 2 MG/2ML injection Inject 1 mL (1 mg total) into the muscle as needed for up to 2 doses (for opioid overdose). Inject content of syringe into thigh muscle. Call 911. 08/06/18   [name], MD  nitroGLYCERIN (NITROSTAT) 0.4 MG SL tablet Place 1 tablet (0.4 mg total) under the tongue every 5 (five) minutes as needed for chest pain. Please keep upcoming appt in October. Thank you 03/05/19   Jerline Pain, MD  NONFORMULARY OR COMPOUNDED ITEM 212.8 mcg by Epidural Infusion route. Medtronic Neuromodulation pump Fentanyl 212.59mcg/day via IT pump, Baclofen, bupivicaine    [provider]  NONFORMULARY OR COMPOUNDED ITEM 88.77 mcg by Epidural Infusion route Continuous EPIDURAL. Medtronic Neuromodulation pump: Fentanyl, baclofen 88.77, bupivicane    [provider]  NONFORMULARY OR COMPOUNDED ITEM 5.918 mg by Epidural Infusion route Continuous EPIDURAL. Medtronic Neuromodulation pump: Fentanyl, Baclofen, Bupivicaine 5.918 mg    [provider]  NONFORMULARY OR COMPOUNDED ITEM Sig: Apply 1-2 gm(s) (2-4 pumps) to affected area, 3-4 times/day. (1 pump = 0.5 gm) 07/08/19 10/06/19  [name], MD  Omega-3 Fatty Acids (FISH OIL) 1000 MG CAPS Take 2,000 mg by mouth 2 (two)  times daily.     [provider]  oxyCODONE (OXY IR/ROXICODONE) 5 MG immediate release tablet Take 1 tablet (5 mg total) by mouth every 8 (eight) hours as needed for severe pain. Must last 30 days. 07/05/19 08/04/19  [name], MD  oxyCODONE (OXY IR/ROXICODONE) 5 MG immediate release tablet Take 1 tablet (5 mg total) by mouth every 8 (eight) hours as needed for severe pain. Must last 30 days. 08/04/19 09/03/19  [name], MD  oxyCODONE (OXY IR/ROXICODONE) 5 MG immediate release tablet Take 1 tablet (5 mg total) by mouth every 8 (eight) hours as needed for severe pain. Must last 30 days. 09/03/19 10/03/19  Dossie Arbour,  Francisco, MD  PARoxetine (PAXIL) 20 MG tablet TAKE ONE-HALF OF A TABLET BY MOUTH DAILY IN THE MORNING 12/15/18   Baity, Coralie Keens, NP  polyethylene glycol (MIRALAX / GLYCOLAX) packet Take 17 g by mouth daily.     [provider]  pregabalin (LYRICA) 150 MG capsule Take 1 capsule (150 mg total) by mouth 3 (three) times daily. 05/06/19 11/02/19  [name], MD  rosuvastatin (CRESTOR) 20 MG tablet TAKE 1 TABLET BY MOUTH EVERY DAY 06/17/19   Jerline Pain, MD  tiZANidine (ZANAFLEX) 4 MG tablet Take 1 tablet (4 mg total) by mouth 3 (three) times daily. 05/06/19 11/02/19  [name], MD  Turmeric 500 MG TABS Take 500 mg by mouth 2 (two) times daily.     [provider]  VITAMIN E PO Take 450 Units by mouth daily.     [provider]    Allergies Patient has no known allergies.  Family History  Problem Relation Age of Onset  . Lung cancer Mother   . Diabetes Father   . Heart disease Maternal Grandfather   . Diabetes Paternal Grandmother   . Colon cancer Paternal Grandfather   . Stroke Neg Hx     Social History Social History   Tobacco Use  . Smoking status: Former Smoker    Packs/day: 1.50    Years: 32.00    Pack years: 48.00    Types: Cigarettes    Quit date: 07/22/2007    Years since quitting: 11.9  . Smokeless tobacco:  Never Used  Substance Use Topics  . Alcohol use: Yes    Alcohol/week: 0.0 standard drinks    Comment: occ - Holidays  . Drug use: No    Review of Systems  Constitutional: Negative for fever. Cardiovascular: Negative for chest pain. Respiratory: Negative for shortness of breath. Musculoskeletal: Negative for back pain. Skin: Negative for rash. Racoon bites to the fingers as above Neurological: Negative for headaches, focal weakness or numbness. ____________________________________________  PHYSICAL EXAM:  VITAL SIGNS: ED Triage Vitals  Enc Vitals Group     BP 07/09/19 1937 137/76     Pulse Rate 07/09/19 1937 79     Resp 07/09/19 1937 20     Temp 07/09/19 1937 99.3 F (37.4 C)     Temp Source 07/09/19 1937 Oral     SpO2 07/09/19 1937 98 %     Weight 07/09/19 1938 212 lb 15.4 oz (96.6 kg)     Height 07/09/19 1938 5\' 9"  (1.753 m)     Head Circumference --      Peak Flow --      Pain Score 07/09/19 1939 2     Pain Loc --      Pain Edu? --      Excl. in [place]? --     Constitutional: Alert and oriented. Well appearing and in no distress. Head: Normocephalic and atraumatic. Eyes: Conjunctivae are normal. Normal extraocular movements Cardiovascular: Normal rate, regular rhythm. Normal distal pulses. Respiratory: Normal respiratory effort. No wheezes/rales/rhonchi. Musculoskeletal: Right hand without obvious deformity or dislocation.  Patient with soft tissue injury to the proximal phalanx of the right index finger.  No active bleeding is appreciated patient has 2 distinct soft tissue injuries consistent with bites.  To the middle finger she has a laceration to the finger tip at the fat pad as well as a small laceration to the nail and some early bruising to the nailbed.  No subungual hematomas appreciated.  Normal composite fist  noted.  Nontender with normal range of motion in all extremities.  Neurologic:  Normal gross sensation.  Normal intrinsic and opposition testing noted.   Normal speech and language. No gross focal neurologic deficits are appreciated. Skin:  Skin is warm, dry and intact. No rash noted. Psychiatric: Mood and affect are normal. Patient exhibits appropriate insight and judgment. ____________________________________________ RADIOLOGY  DG Right Hand  IMPRESSION: Mild degenerative change without acute abnormality. _____________________________________________ PROCEDURES  Tdap 0.5 ml IM Augmentin 875 mg PO Rabavert 1 ml IM Rabies IG 1,950 units IM Wound care Procedures ____________________________________________  INITIAL IMPRESSION / ASSESSMENT AND PLAN / ED COURSE  Patient with ED evaluation of an unintentional accidental bite to the left hand by a raccoon.  Patient be prophylaxed empirically for rabies, as the animal is unavailable for catcher and evaluation.  Patient will be started on Augmentin and the initial doses of RIG are provided in the ED.  She will return to the ED according to the following schedule.   Rabies Vaccine #2 of 4  Sunday, Jan 10 Rabies Vaccine #3 of 4  Friday, Jan 14 Rabies Vaccine #4 or 4  Friday, Jan 21   Melinda [name] was evaluated in Emergency Department on 07/09/2019 for the symptoms described in the history of present illness. She was evaluated in the context of the global COVID-19 pandemic, which necessitated consideration that the patient might be at risk for infection with the SARS-CoV-2 virus that causes COVID-19. Institutional protocols and algorithms that pertain to the evaluation of patients at risk for COVID-19 are in a state of rapid change based on information released by regulatory bodies including the CDC and federal and state organizations. These policies and algorithms were followed during the patient's care in the ED. ____________________________________________  FINAL CLINICAL IMPRESSION(S) / ED DIAGNOSES  Final diagnoses:  Need for post exposure prophylaxis for rabies  Raccoon bite, initial  encounter      Melvenia Needles, PA-C 07/09/19 [id number]    Vanessa Hartin, MD 07/10/19 [phone]

## 2019-07-09 NOTE — ED Triage Notes (Signed)
Patient to ER for wound to right 2nd and 3rd finger after raccoon bite.

## 2019-07-09 NOTE — ED Notes (Signed)
Pt tolerated injections well. Pt calm and talking.

## 2019-07-09 NOTE — ED Notes (Signed)
[place] Personal assistant contacted

## 2019-07-09 NOTE — ED Notes (Signed)
Animal control returned call and had this nurse to give pt a phone number to call once she returns home

## 2019-07-09 NOTE — ED Notes (Signed)
Pt was trying to free a raccoon in the yard around 3pm and it attacked her causing animal bite on 1st and 2nd finger - bleeding controlled at this time - animal control was not notified - will contact now

## 2019-07-09 NOTE — Discharge Instructions (Addendum)
You are being treated for bites from a racoon. The rabies status of the animal is unknown, and the animal has not/can not be captured and tested. Keep the wounds clean, dry, and covered. You should take the antibiotic as directed and return to the ED according to the following schedule.   Rabies Vaccine #2 of 4  Sunday, Jan 10 Rabies Vaccine #3 of 4  Friday, Jan 14 Rabies Vaccine #4 or 4  Friday, Jan 21

## 2019-07-12 ENCOUNTER — Other Ambulatory Visit: Payer: Self-pay

## 2019-07-12 ENCOUNTER — Emergency Department
Admission: EM | Admit: 2019-07-12 | Discharge: 2019-07-12 | Disposition: A | Discharge status: Home / Self Care | Payer: Medicare Other | Attending: Emergency Medicine | Admitting: Emergency Medicine

## 2019-07-12 DIAGNOSIS — E1122 Type 2 diabetes mellitus with diabetic chronic kidney disease: Secondary | ICD-10-CM | POA: Diagnosis not present

## 2019-07-12 DIAGNOSIS — Z203 Contact with and (suspected) exposure to rabies: Secondary | ICD-10-CM | POA: Diagnosis not present

## 2019-07-12 DIAGNOSIS — N183 Chronic kidney disease, stage 3 unspecified: Secondary | ICD-10-CM | POA: Insufficient documentation

## 2019-07-12 DIAGNOSIS — Z23 Encounter for immunization: Secondary | ICD-10-CM | POA: Diagnosis not present

## 2019-07-12 DIAGNOSIS — I2510 Atherosclerotic heart disease of native coronary artery without angina pectoris: Secondary | ICD-10-CM | POA: Insufficient documentation

## 2019-07-12 DIAGNOSIS — I129 Hypertensive chronic kidney disease with stage 1 through stage 4 chronic kidney disease, or unspecified chronic kidney disease: Secondary | ICD-10-CM | POA: Insufficient documentation

## 2019-07-12 MED ORDER — RABIES VACCINE, PCEC IM SUSR
1.0000 mL | Freq: Once | INTRAMUSCULAR | Status: AC
  Administered 2019-07-12: 1 mL via INTRAMUSCULAR
  Filled 2019-07-12: qty 1

## 2019-07-12 NOTE — ED Provider Notes (Signed)
[facility] Emergency Department Provider Note  ____________________________________________   First MD Initiated Contact with Patient 07/12/19 [phone]     (approximate)  I have reviewed the triage vital signs and the nursing notes.   HISTORY  Chief Complaint Rabies Injection   HPI Melinda [name] is a 64 y.o. female presents to the ED for second rabies vaccine.  Patient was seen initially on 07/09/2019 for an animal bite.  He states he is not having any difficulties.     Past Medical History:  Diagnosis Date  . Amputation of great toe, right, traumatic ([place]) 05/30/2010  . Amputation of second toe, right, traumatic ([place]) 09/2017  . Anemia    years ago  . Anxiety   . Blue toes    2nd toe on right foot, will get appt.  . Bulging disc   . CAD (coronary artery disease) 2009   s/p stent to LAD  . 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  Ablations done.  Now has rare episodes  . Chicken pox   . Chronic fatigue   . Chronic kidney disease    "problem with kidney filtration"  . Coronary arteritis   . Degenerative disc disease   . Depression   . Diabetes mellitus without complication ([place])   . Facet joint disease   . Fibromyalgia   . Heart disease   . Hyperlipidemia   . IBS (irritable bowel syndrome)   . MCL deficiency, knee   . MRSA (methicillin resistant staph aureus) culture positive 2011   GREAT TOE RIGHT FOOT  . Neuropathy 04/18/2010  . Peripheral neuropathy   . Renal insufficiency   . Restless leg syndrome   . Sepsis ([place]) 09/2013  . Sleep apnea 08/17/2003   uses CPAP, sleep study at [facility] (mild to moderate)  . Spinal stenosis     Patient Active Problem List   Diagnosis Date Noted  . Sensation disturbance of skin 07/08/2019  . Allodynia 07/08/2019  . Hyperalgesia 07/08/2019  . Chronic low back pain (Bilateral) w/o sciatica 06/23/2019  . DDD (degenerative disc disease), lumbosacral 06/23/2019  . Diabetes  1.5, managed as type 2 ([place]) 05/12/2019  . Spondylosis without myelopathy or radiculopathy, lumbosacral region 11/05/2018  . CKD (chronic kidney disease) stage 3, GFR 30-59 ml/min 09/04/2018  . 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 08/06/2018  . DDD (degenerative disc disease), lumbar 05/15/2018  . Abnormal MRI, lumbar spine (04/01/2018) 05/06/2018  . Lumbar facet hypertrophy (Multilevel) 05/06/2018  . Lumbar foraminal stenosis 05/06/2018  . Lumbar lateral recess stenosis 05/06/2018  . Lumbar spinal stenosis w/ neurogenic claudication 04/09/2018  . Degenerative arthritis of knee (Bilateral) 03/12/2018  . Lumbar spondylosis 01/23/2018  . Hip pain, acute, left 01/01/2018  . Other secondary hypertension 08/09/2017  . OSA (obstructive sleep apnea) 08/09/2017  . Osteoarthritis of knee (Bilateral) (R>L) 06/08/2016  . Long term current use of opiate analgesic 04/21/2016  . Long term prescription opiate use 04/21/2016  . Opiate use 04/21/2016  . Encounter for adjustment or management of infusion pump 04/21/2016  . Osteoarthritis, multiple sites 04/21/2016  . Chronic musculoskeletal pain 04/21/2016  . Anxiety and depression 09/11/2015  . Opioid-induced constipation (OIC) 07/27/2015  . Chronic knee pain (Primary Area of Pain) (Right) 07/27/2015  . Osteoarthritis of knee (Left) 07/27/2015  . 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 Chronic neck pain 05/24/2015  . Chronic low back  pain (Bilateral) (L>R) w/ sciatica (Right) 05/24/2015  . Epidural fibrosis 05/24/2015  . Cervical spondylosis 05/24/2015  . Neuropathic pain 05/24/2015  . Lumbar facet syndrome 05/24/2015  . Fibromyalgia 05/24/2015  . Presence of functional implant (Medtronic programmable intrathecal pump) (Right abdominal area) 05/24/2015  . Presence of intrathecal pump (Medtronic intrathecal programmable pump) (40 mL pump) 05/24/2015  .  Cervical spondylosis 05/24/2015  . Type 2 diabetes mellitus with diabetic mononeuropathy, without long-term current use of insulin ([place]) 04/27/2014  . Pure hypercholesterolemia 03/02/2014  . CAD (coronary artery disease) 10/04/2013  . Atherosclerotic heart disease of native coronary artery without angina pectoris 10/04/2013  . Chronic pain syndrome 04/18/2010    Past Surgical History:  Procedure Laterality Date  . ABLATION     UTERUS  . ABLATION     HEART  . AMPUTATION TOE Right 02/01/2017   Procedure: AMPUTATION TOE-RIGHT 2ND MPJ;  Surgeon: Albertine Patricia, DPM;  Location: [facility];  Service: Podiatry;  Laterality: Right;  . ANTERIOR CERVICAL DECOMP/DISCECTOMY FUSION N/A 07/28/2014   Procedure: ANTERIOR CERVICAL DECOMPRESSION/DISCECTOMY FUSION CERVICAL 3-4,4-5,5-6 LEVELS WITH INSTRUMENTATION AND ALLOGRAFT;  Surgeon: Sinclair Ship, MD;  Location: [place];  Service: Orthopedics;  Laterality: N/A;  Anterior cervical decompression fusion, cervical 3-4, cervical 4-5, cervical 5-6 with instrumentation and allograft  . BACK SURGERY     X 3 1979, 1994, 1995  . CARPAL TUNNEL RELEASE Right   . CHOLECYSTECTOMY  2003  . CORONARY ANGIOPLASTY  2008  . EYE SURGERY Bilateral 2013   Eyelid lift   . FOOT SURGERY Right    BIG TOE  . FOOT SURGERY Bilateral    PLANTAR FASCIITIS  . FOOT SURGERY Right    2ND TOE  . GALLBLADDER SURGERY    . HAMMER TOE SURGERY Right 10/17/2017   Procedure: HAMMER TOE CORRECTION-4TH TOE;  Surgeon: Albertine Patricia, DPM;  Location: [place];  Service: Podiatry;  Laterality: Right;  LMA- WITH LOCAL Diabetic - diet controlled  . HAND SURGERY Left   . HAND SURGEY Left   . HEART STENT  2009   LAD  . INFUSION PUMP IMPLANTATION     X2 with morphine and baclofen  . INTRATHECAL PUMP REVISION N/A 04/25/2018   Procedure: Intrathecal pump replacement;  Surgeon: Clydell Hakim, MD;  Location: [place];  Service: Neurosurgery;  Laterality: N/A;  right  . INTRATHECAL  PUMP REVISION Right 04/25/2018  . INTRATHECAL PUMP REVISION N/A 12/12/2018   Procedure: Intrathecal pump revision with exploration of pocket;  Surgeon: Clydell Hakim, MD;  Location: [place];  Service: Neurosurgery;  Laterality: N/A;  Intrathecal pump revision with exploration of pocket  . IRRIGATION AND DEBRIDEMENT FOOT Right 02/23/2017   Procedure: IRRIGATION AND DEBRIDEMENT FOOT-right foot;  Surgeon: Samara Deist, DPM;  Location: [facility];  Service: Podiatry;  Laterality: Right;  . PAIN PUMP REVISION N/A 08/15/2018   Procedure: Intrathecal PUMP REVISION;  Surgeon: Clydell Hakim, MD;  Location: [place];  Service: Neurosurgery;  Laterality: N/A;  INTRATHECAL PUMP REVISION  . TOE SURGERY     then revision 8/18    Prior to Admission medications   Medication Sig Start Date End Date Taking? Authorizing Provider  ACCU-CHEK AVIVA PLUS test strip USE TO CHECK BLOOD SUGAR ONE TIME A DAY 07/02/19   Baity, Coralie Keens, NP  ACCU-CHEK SOFTCLIX LANCETS lancets USE TO CHECK BLOOD SUGAR 1 TIME DAILY. DX: T65.46 09/01/18   Jearld Fenton, NP  AMBULATORY NON FORMULARY MEDICATION Medication Name: CPAP MASK OF CHOICE FOR HOME DEVICE  07/01/15   Laverle Hobby, MD  amoxicillin-clavulanate (AUGMENTIN) 875-125 MG tablet Take 1 tablet by mouth 2 (two) times daily for 10 days. 07/10/19 07/20/19  Menshew, Dannielle Karvonen, PA-C  aspirin 81 MG tablet Take 81 mg by mouth daily.    [provider]  buPROPion (WELLBUTRIN XL) 150 MG 24 hr tablet TAKE 1 TABLET BY MOUTH EVERY DAY 05/19/19   Baity, Coralie Keens, NP  Cinnamon 500 MG TABS Take 1,000 mg by mouth daily.    [provider]  Coenzyme Q10 (CO Q 10) 100 MG CAPS Take 100 mg by mouth daily.     [provider]  fluticasone (FLONASE) 50 MCG/ACT nasal spray Place 2 sprays into both nostrils daily. 03/16/19   [provider]  gentamicin ointment (GARAMYCIN) 0.1 % APPLY TO NOSE THREE TIMES DAILY 03/16/19   [provider]   glucosamine-chondroitin 500-400 MG tablet Take 2 tablets by mouth daily.     [provider]  Magnesium 250 MG TABS Take 250 mg by mouth daily.    [provider]  Misc Natural Products (TART CHERRY ADVANCED PO) Take 1,200 mg by mouth at bedtime.     [provider]  Multiple Vitamin (MULTIVITAMIN) tablet Take 1 tablet by mouth daily.    [provider]  naloxone [facility]) 2 MG/2ML injection Inject 1 mL (1 mg total) into the muscle as needed for up to 2 doses (for opioid overdose). Inject content of syringe into thigh muscle. Call 911. 08/06/18   [name], MD  nitroGLYCERIN (NITROSTAT) 0.4 MG SL tablet Place 1 tablet (0.4 mg total) under the tongue every 5 (five) minutes as needed for chest pain. Please keep upcoming appt in October. Thank you 03/05/19   Jerline Pain, MD  NONFORMULARY OR COMPOUNDED ITEM 212.8 mcg by Epidural Infusion route. Medtronic Neuromodulation pump Fentanyl 212.73mcg/day via IT pump, Baclofen, bupivicaine    [provider]  NONFORMULARY OR COMPOUNDED ITEM 88.77 mcg by Epidural Infusion route Continuous EPIDURAL. Medtronic Neuromodulation pump: Fentanyl, baclofen 88.77, bupivicane    [provider]  NONFORMULARY OR COMPOUNDED ITEM 5.918 mg by Epidural Infusion route Continuous EPIDURAL. Medtronic Neuromodulation pump: Fentanyl, Baclofen, Bupivicaine 5.918 mg    [provider]  NONFORMULARY OR COMPOUNDED ITEM Sig: Apply 1-2 gm(s) (2-4 pumps) to affected area, 3-4 times/day. (1 pump = 0.5 gm) 07/08/19 10/06/19  [name], MD  Omega-3 Fatty Acids (FISH OIL) 1000 MG CAPS Take 2,000 mg by mouth 2 (two) times daily.     [provider]  oxyCODONE (OXY IR/ROXICODONE) 5 MG immediate release tablet Take 1 tablet (5 mg total) by mouth every 8 (eight) hours as needed for severe pain. Must last 30 days. 07/05/19 08/04/19  [name], MD  oxyCODONE (OXY IR/ROXICODONE) 5 MG immediate release tablet Take 1  tablet (5 mg total) by mouth every 8 (eight) hours as needed for severe pain. Must last 30 days. 08/04/19 09/03/19  [name], MD  oxyCODONE (OXY IR/ROXICODONE) 5 MG immediate release tablet Take 1 tablet (5 mg total) by mouth every 8 (eight) hours as needed for severe pain. Must last 30 days. 09/03/19 10/03/19  [name], MD  PARoxetine (PAXIL) 20 MG tablet TAKE ONE-HALF OF A TABLET BY MOUTH DAILY IN THE MORNING 12/15/18   Jearld Fenton, NP  polyethylene glycol (MIRALAX / GLYCOLAX) packet Take 17 g by mouth daily.     [provider]  pregabalin (LYRICA) 150 MG capsule Take 1 capsule (150 mg  total) by mouth 3 (three) times daily. 05/06/19 11/02/19  [name], MD  rosuvastatin (CRESTOR) 20 MG tablet TAKE 1 TABLET BY MOUTH EVERY DAY 06/17/19   Jerline Pain, MD  tiZANidine (ZANAFLEX) 4 MG tablet Take 1 tablet (4 mg total) by mouth 3 (three) times daily. 05/06/19 11/02/19  [name], MD  Turmeric 500 MG TABS Take 500 mg by mouth 2 (two) times daily.     [provider]  VITAMIN E PO Take 450 Units by mouth daily.     [provider]    Allergies Patient has no known allergies.  Family History  Problem Relation Age of Onset  . Lung cancer Mother   . Diabetes Father   . Heart disease Maternal Grandfather   . Diabetes Paternal Grandmother   . Colon cancer Paternal Grandfather   . Stroke Neg Hx     Social History Social History   Tobacco Use  . Smoking status: Former Smoker    Packs/day: 1.50    Years: 32.00    Pack years: 48.00    Types: Cigarettes    Quit date: 07/22/2007    Years since quitting: 11.9  . Smokeless tobacco: Never Used  Substance Use Topics  . Alcohol use: Yes    Alcohol/week: 0.0 standard drinks    Comment: occ - Holidays  . Drug use: No    Review of Systems Constitutional: No fever/chills Cardiovascular: Denies chest pain. Respiratory: Denies shortness of breath. Gastrointestinal: No abdominal pain.  No  nausea, no vomiting. Musculoskeletal: Negative for back pain. Skin: Negative for rash. Neurological: Negative for headaches, focal weakness or numbness. ___________________________________________   PHYSICAL EXAM:  VITAL SIGNS: ED Triage Vitals [07/12/19 0930]  Enc Vitals Group     BP      Pulse      Resp      Temp      Temp src      SpO2      Weight 218 lb (98.9 kg)     Height      Head Circumference      Peak Flow      Pain Score 0     Pain Loc      Pain Edu?      Excl. in [place]?     Constitutional: Alert and oriented. Well appearing and in no acute distress. Eyes: Conjunctivae are normal.  Head: Atraumatic. Neck: No stridor.   Cardiovascular: Normal rate, regular rhythm. Grossly normal heart sounds.  Good peripheral circulation. Respiratory: Normal respiratory effort.  No retractions. Lungs CTAB. Musculoskeletal: No lower extremity tenderness nor edema.  No joint effusions. Neurologic:  Normal speech and language. No gross focal neurologic deficits are appreciated. No gait instability. Skin:  Skin is warm, dry and intact. No rash noted. Psychiatric: Mood and affect are normal. Speech and behavior are normal.  ____________________________________________   LABS (all labs ordered are listed, but only abnormal results are displayed)  Labs Reviewed - No data to display _  PROCEDURES  Procedure(s) performed (including Critical Care):  Procedures   ____________________________________________   INITIAL IMPRESSION / ASSESSMENT AND PLAN / ED COURSE  As part of my medical decision making, I reviewed the following data within the electronic MEDICAL RECORD NUMBER Notes from prior ED visits and  Controlled Substance Database  64 year old female presents to the ED for second rabies vaccine.  She states that she did not have any difficulties with the initial vaccines.  She continues to take the Augmentin without  any difficulties.  She will continue to return for scheduled  rabies.  ____________________________________________   FINAL CLINICAL IMPRESSION(S) / ED DIAGNOSES  Final diagnoses:  Encounter for administration of vaccine     ED Discharge Orders    None       Note:  This document was prepared using Dragon voice recognition software and may include unintentional dictation errors.    Johnn Hai, PA-C 07/12/19 1026    Harvest Dark, MD 07/12/19 1520

## 2019-07-12 NOTE — Discharge Instructions (Addendum)
Return for remaining rabies vaccine.

## 2019-07-12 NOTE — ED Triage Notes (Signed)
Here for second rabies vaccine. No other complaints.

## 2019-07-14 DIAGNOSIS — E538 Deficiency of other specified B group vitamins: Secondary | ICD-10-CM | POA: Diagnosis not present

## 2019-07-14 DIAGNOSIS — R200 Anesthesia of skin: Secondary | ICD-10-CM | POA: Diagnosis not present

## 2019-07-14 DIAGNOSIS — R202 Paresthesia of skin: Secondary | ICD-10-CM | POA: Diagnosis not present

## 2019-07-15 DIAGNOSIS — L851 Acquired keratosis [keratoderma] palmaris et plantaris: Secondary | ICD-10-CM | POA: Diagnosis not present

## 2019-07-15 DIAGNOSIS — T888XXD Other specified complications of surgical and medical care, not elsewhere classified, subsequent encounter: Secondary | ICD-10-CM | POA: Diagnosis not present

## 2019-07-15 DIAGNOSIS — Z89421 Acquired absence of other right toe(s): Secondary | ICD-10-CM | POA: Diagnosis not present

## 2019-07-15 DIAGNOSIS — R2689 Other abnormalities of gait and mobility: Secondary | ICD-10-CM | POA: Diagnosis not present

## 2019-07-15 DIAGNOSIS — E1142 Type 2 diabetes mellitus with diabetic polyneuropathy: Secondary | ICD-10-CM | POA: Diagnosis not present

## 2019-07-16 ENCOUNTER — Encounter: Payer: Self-pay | Admitting: Emergency Medicine

## 2019-07-16 ENCOUNTER — Other Ambulatory Visit: Payer: Self-pay

## 2019-07-16 ENCOUNTER — Emergency Department
Admission: EM | Admit: 2019-07-16 | Discharge: 2019-07-16 | Disposition: A | Discharge status: Home / Self Care | Payer: Medicare Other | Attending: Student | Admitting: Student

## 2019-07-16 DIAGNOSIS — I2510 Atherosclerotic heart disease of native coronary artery without angina pectoris: Secondary | ICD-10-CM | POA: Diagnosis not present

## 2019-07-16 DIAGNOSIS — Z87891 Personal history of nicotine dependence: Secondary | ICD-10-CM | POA: Insufficient documentation

## 2019-07-16 DIAGNOSIS — Z79899 Other long term (current) drug therapy: Secondary | ICD-10-CM | POA: Diagnosis not present

## 2019-07-16 DIAGNOSIS — Z7982 Long term (current) use of aspirin: Secondary | ICD-10-CM | POA: Insufficient documentation

## 2019-07-16 DIAGNOSIS — E1122 Type 2 diabetes mellitus with diabetic chronic kidney disease: Secondary | ICD-10-CM | POA: Insufficient documentation

## 2019-07-16 DIAGNOSIS — N183 Chronic kidney disease, stage 3 unspecified: Secondary | ICD-10-CM | POA: Insufficient documentation

## 2019-07-16 DIAGNOSIS — Z23 Encounter for immunization: Secondary | ICD-10-CM | POA: Insufficient documentation

## 2019-07-16 DIAGNOSIS — Z203 Contact with and (suspected) exposure to rabies: Secondary | ICD-10-CM | POA: Insufficient documentation

## 2019-07-16 MED ORDER — RABIES VACCINE, PCEC IM SUSR
1.0000 mL | Freq: Once | INTRAMUSCULAR | Status: AC
  Administered 2019-07-16: 1 mL via INTRAMUSCULAR
  Filled 2019-07-16: qty 1

## 2019-07-16 NOTE — ED Provider Notes (Signed)
[facility] Emergency Department Provider Note   ____________________________________________   First MD Initiated Contact with Patient 07/16/19 0930     (approximate)  I have reviewed the triage vital signs and the nursing notes.   HISTORY  Chief Complaint No chief complaint on file.    HPI Melinda [name] is a 64 y.o. female patient presents for third rabies injection.  Patient was bitten by a raccoon on the second and third digit right hand.  Patient had no reaction from previous injections.         Past Medical History:  Diagnosis Date  . Amputation of great toe, right, traumatic ([place]) 05/30/2010  . Amputation of second toe, right, traumatic ([place]) 09/2017  . Anemia    years ago  . Anxiety   . Blue toes    2nd toe on right foot, will get appt.  . Bulging disc   . CAD (coronary artery disease) 2009   s/p stent to LAD  . 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  Ablations done.  Now has rare episodes  . Chicken pox   . Chronic fatigue   . Chronic kidney disease    "problem with kidney filtration"  . Coronary arteritis   . Degenerative disc disease   . Depression   . Diabetes mellitus without complication ([place])   . Facet joint disease   . Fibromyalgia   . Heart disease   . Hyperlipidemia   . IBS (irritable bowel syndrome)   . MCL deficiency, knee   . MRSA (methicillin resistant staph aureus) culture positive 2011   GREAT TOE RIGHT FOOT  . Neuropathy 04/18/2010  . Peripheral neuropathy   . Renal insufficiency   . Restless leg syndrome   . Sepsis ([place]) 09/2013  . Sleep apnea 08/17/2003   uses CPAP, sleep study at [facility] (mild to moderate)  . Spinal stenosis     Patient Active Problem List   Diagnosis Date Noted  . Sensation disturbance of skin 07/08/2019  . Allodynia 07/08/2019  . Hyperalgesia 07/08/2019  . Chronic low back pain (Bilateral) w/o sciatica 06/23/2019  . DDD (degenerative disc  disease), lumbosacral 06/23/2019  . 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 Spondylosis without myelopathy or radiculopathy, lumbosacral region 11/05/2018  . CKD (chronic kidney disease) stage 3, GFR 30-59 ml/min 09/04/2018  . 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 08/06/2018  . DDD (degenerative disc disease), lumbar 05/15/2018  . Abnormal MRI, lumbar spine (04/01/2018) 05/06/2018  . Lumbar facet hypertrophy (Multilevel) 05/06/2018  . Lumbar foraminal stenosis 05/06/2018  . Lumbar lateral recess stenosis 05/06/2018  . Lumbar spinal stenosis w/ neurogenic claudication 04/09/2018  . Degenerative arthritis of knee (Bilateral) 03/12/2018  . Lumbar spondylosis 01/23/2018  . Hip pain, acute, left 01/01/2018  . Other secondary hypertension 08/09/2017  . OSA (obstructive sleep apnea) 08/09/2017  . Osteoarthritis of knee (Bilateral) (R>L) 06/08/2016  . Long term current use of opiate analgesic 04/21/2016  . Long term prescription opiate use 04/21/2016  . Opiate use 04/21/2016  . Encounter for adjustment or management of infusion pump 04/21/2016  . Osteoarthritis, multiple sites 04/21/2016  . Chronic musculoskeletal pain 04/21/2016  . Anxiety and depression 09/11/2015  . Opioid-induced constipation (OIC) 07/27/2015  . Chronic knee pain (Primary Area of Pain) (Right) 07/27/2015  . Osteoarthritis of knee (Left) 07/27/2015  . 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  Chronic neck pain 05/24/2015  . Chronic low back pain (Bilateral) (L>R) w/ sciatica (Right) 05/24/2015  . Epidural fibrosis 05/24/2015  . Cervical spondylosis 05/24/2015  . Neuropathic pain 05/24/2015  . Lumbar facet syndrome 05/24/2015  . Fibromyalgia 05/24/2015  . Presence of functional implant (Medtronic programmable intrathecal pump) (Right abdominal area) 05/24/2015  . Presence of intrathecal pump (Medtronic intrathecal  programmable pump) (40 mL pump) 05/24/2015  . Cervical spondylosis 05/24/2015  . Type 2 diabetes mellitus with diabetic mononeuropathy, without long-term current use of insulin ([place]) 04/27/2014  . Pure hypercholesterolemia 03/02/2014  . CAD (coronary artery disease) 10/04/2013  . Atherosclerotic heart disease of native coronary artery without angina pectoris 10/04/2013  . Chronic pain syndrome 04/18/2010    Past Surgical History:  Procedure Laterality Date  . ABLATION     UTERUS  . ABLATION     HEART  . AMPUTATION TOE Right 02/01/2017   Procedure: AMPUTATION TOE-RIGHT 2ND MPJ;  Surgeon: Albertine Patricia, DPM;  Location: [facility];  Service: Podiatry;  Laterality: Right;  . ANTERIOR CERVICAL DECOMP/DISCECTOMY FUSION N/A 07/28/2014   Procedure: ANTERIOR CERVICAL DECOMPRESSION/DISCECTOMY FUSION CERVICAL 3-4,4-5,5-6 LEVELS WITH INSTRUMENTATION AND ALLOGRAFT;  Surgeon: Sinclair Ship, MD;  Location: [place];  Service: Orthopedics;  Laterality: N/A;  Anterior cervical decompression fusion, cervical 3-4, cervical 4-5, cervical 5-6 with instrumentation and allograft  . BACK SURGERY     X 3 1979, 1994, 1995  . CARPAL TUNNEL RELEASE Right   . CHOLECYSTECTOMY  2003  . CORONARY ANGIOPLASTY  2008  . EYE SURGERY Bilateral 2013   Eyelid lift   . FOOT SURGERY Right    BIG TOE  . FOOT SURGERY Bilateral    PLANTAR FASCIITIS  . FOOT SURGERY Right    2ND TOE  . GALLBLADDER SURGERY    . HAMMER TOE SURGERY Right 10/17/2017   Procedure: HAMMER TOE CORRECTION-4TH TOE;  Surgeon: Albertine Patricia, DPM;  Location: [place];  Service: Podiatry;  Laterality: Right;  LMA- WITH LOCAL Diabetic - diet controlled  . HAND SURGERY Left   . HAND SURGEY Left   . HEART STENT  2009   LAD  . INFUSION PUMP IMPLANTATION     X2 with morphine and baclofen  . INTRATHECAL PUMP REVISION N/A 04/25/2018   Procedure: Intrathecal pump replacement;  Surgeon: Clydell Hakim, MD;  Location: [place];  Service:  Neurosurgery;  Laterality: N/A;  right  . INTRATHECAL PUMP REVISION Right 04/25/2018  . INTRATHECAL PUMP REVISION N/A 12/12/2018   Procedure: Intrathecal pump revision with exploration of pocket;  Surgeon: Clydell Hakim, MD;  Location: [place];  Service: Neurosurgery;  Laterality: N/A;  Intrathecal pump revision with exploration of pocket  . IRRIGATION AND DEBRIDEMENT FOOT Right 02/23/2017   Procedure: IRRIGATION AND DEBRIDEMENT FOOT-right foot;  Surgeon: Samara Deist, DPM;  Location: [facility];  Service: Podiatry;  Laterality: Right;  . PAIN PUMP REVISION N/A 08/15/2018   Procedure: Intrathecal PUMP REVISION;  Surgeon: Clydell Hakim, MD;  Location: [place];  Service: Neurosurgery;  Laterality: N/A;  INTRATHECAL PUMP REVISION  . TOE SURGERY     then revision 8/18    Prior to Admission medications   Medication Sig Start Date End Date Taking? Authorizing Provider  ACCU-CHEK AVIVA PLUS test strip USE TO CHECK BLOOD SUGAR ONE TIME A DAY 07/02/19   Baity, Coralie Keens, NP  ACCU-CHEK SOFTCLIX LANCETS lancets USE TO CHECK BLOOD SUGAR 1 TIME DAILY. DX: B71.69 09/01/18   Jearld Fenton, NP  [place]  Medication Name: CPAP MASK OF CHOICE FOR HOME DEVICE 07/01/15   Laverle Hobby, MD  amoxicillin-clavulanate (AUGMENTIN) 875-125 MG tablet Take 1 tablet by mouth 2 (two) times daily for 10 days. 07/10/19 07/20/19  Menshew, Dannielle Karvonen, PA-C  aspirin 81 MG tablet Take 81 mg by mouth daily.    [provider]  buPROPion (WELLBUTRIN XL) 150 MG 24 hr tablet TAKE 1 TABLET BY MOUTH EVERY DAY 05/19/19   Baity, Coralie Keens, NP  Cinnamon 500 MG TABS Take 1,000 mg by mouth daily.    [provider]  Coenzyme Q10 (CO Q 10) 100 MG CAPS Take 100 mg by mouth daily.     [provider]  fluticasone (FLONASE) 50 MCG/ACT nasal spray Place 2 sprays into both nostrils daily. 03/16/19   [provider]  gentamicin ointment (GARAMYCIN) 0.1 % APPLY TO NOSE THREE TIMES DAILY  03/16/19   [provider]  glucosamine-chondroitin 500-400 MG tablet Take 2 tablets by mouth daily.     [provider]  Magnesium 250 MG TABS Take 250 mg by mouth daily.    [provider]  Misc Natural Products (TART CHERRY ADVANCED PO) Take 1,200 mg by mouth at bedtime.     [provider]  Multiple Vitamin (MULTIVITAMIN) tablet Take 1 tablet by mouth daily.    [provider]  naloxone [facility]) 2 MG/2ML injection Inject 1 mL (1 mg total) into the muscle as needed for up to 2 doses (for opioid overdose). Inject content of syringe into thigh muscle. Call 911. 08/06/18   [name], MD  nitroGLYCERIN (NITROSTAT) 0.4 MG SL tablet Place 1 tablet (0.4 mg total) under the tongue every 5 (five) minutes as needed for chest pain. Please keep upcoming appt in October. Thank you 03/05/19   Jerline Pain, MD  NONFORMULARY OR COMPOUNDED ITEM 212.8 mcg by Epidural Infusion route. Medtronic Neuromodulation pump Fentanyl 212.46mcg/day via IT pump, Baclofen, bupivicaine    [provider]  NONFORMULARY OR COMPOUNDED ITEM 88.77 mcg by Epidural Infusion route Continuous EPIDURAL. Medtronic Neuromodulation pump: Fentanyl, baclofen 88.77, bupivicane    [provider]  NONFORMULARY OR COMPOUNDED ITEM 5.918 mg by Epidural Infusion route Continuous EPIDURAL. Medtronic Neuromodulation pump: Fentanyl, Baclofen, Bupivicaine 5.918 mg    [provider]  NONFORMULARY OR COMPOUNDED ITEM Sig: Apply 1-2 gm(s) (2-4 pumps) to affected area, 3-4 times/day. (1 pump = 0.5 gm) 07/08/19 10/06/19  [name], MD  Omega-3 Fatty Acids (FISH OIL) 1000 MG CAPS Take 2,000 mg by mouth 2 (two) times daily.     [provider]  oxyCODONE (OXY IR/ROXICODONE) 5 MG immediate release tablet Take 1 tablet (5 mg total) by mouth every 8 (eight) hours as needed for severe pain. Must last 30 days. 07/05/19 08/04/19  [name], MD  oxyCODONE (OXY IR/ROXICODONE) 5  MG immediate release tablet Take 1 tablet (5 mg total) by mouth every 8 (eight) hours as needed for severe pain. Must last 30 days. 08/04/19 09/03/19  [name], MD  oxyCODONE (OXY IR/ROXICODONE) 5 MG immediate release tablet Take 1 tablet (5 mg total) by mouth every 8 (eight) hours as needed for severe pain. Must last 30 days. 09/03/19 10/03/19  [name], MD  PARoxetine (PAXIL) 20 MG tablet TAKE ONE-HALF OF A TABLET BY MOUTH DAILY IN THE MORNING 12/15/18   Jearld Fenton, NP  polyethylene glycol (MIRALAX / GLYCOLAX) packet Take 17 g by mouth daily.     [provider]  pregabalin (  LYRICA) 150 MG capsule Take 1 capsule (150 mg total) by mouth 3 (three) times daily. 05/06/19 11/02/19  [name], MD  rosuvastatin (CRESTOR) 20 MG tablet TAKE 1 TABLET BY MOUTH EVERY DAY 06/17/19   Jerline Pain, MD  tiZANidine (ZANAFLEX) 4 MG tablet Take 1 tablet (4 mg total) by mouth 3 (three) times daily. 05/06/19 11/02/19  [name], MD  Turmeric 500 MG TABS Take 500 mg by mouth 2 (two) times daily.     [provider]  VITAMIN E PO Take 450 Units by mouth daily.     [provider]    Allergies Patient has no known allergies.  Family History  Problem Relation Age of Onset  . Lung cancer Mother   . Diabetes Father   . Heart disease Maternal Grandfather   . Diabetes Paternal Grandmother   . Colon cancer Paternal Grandfather   . Stroke Neg Hx     Social History Social History   Tobacco Use  . Smoking status: Former Smoker    Packs/day: 1.50    Years: 32.00    Pack years: 48.00    Types: Cigarettes    Quit date: 07/22/2007    Years since quitting: 11.9  . Smokeless tobacco: Never Used  Substance Use Topics  . Alcohol use: Yes    Alcohol/week: 0.0 standard drinks    Comment: occ - Holidays  . Drug use: No    Review of Systems Constitutional: No fever/chills Eyes: No visual changes. ENT: No sore throat. Cardiovascular: Denies chest  pain. Respiratory: Denies shortness of breath. Gastrointestinal: No abdominal pain.  No nausea, no vomiting.  No diarrhea.  No constipation. Genitourinary: Negative for dysuria. Musculoskeletal: Negative for back pain. Skin: Negative for rash. Neurological: Negative for headaches, focal weakness or numbness. Psychiatric:  Anxiety depression Endocrine:  Diabetes,hyperlipidemia, and hypertension.  ____________________________________________   PHYSICAL EXAM:  VITAL SIGNS: ED Triage Vitals  Enc Vitals Group     BP      Pulse      Resp      Temp      Temp src      SpO2      Weight      Height      Head Circumference      Peak Flow      Pain Score      Pain Loc      Pain Edu?      Excl. in [place]?    Constitutional: Alert and oriented. Well appearing and in no acute distress. ardiovascular: Normal rate, regular rhythm. Grossly normal heart sounds.  Good peripheral circulation. Respiratory: Normal respiratory effort.  No retractions. Lungs CTAB. Neurologic:  Normal speech and language. No gross focal neurologic deficits are appreciated. No gait instability. Skin:  Skin is warm, dry and intact. No rash noted. Psychiatric: Mood and affect are normal. Speech and behavior are normal.  ____________________________________________   LABS (all labs ordered are listed, but only abnormal results are displayed)  Labs Reviewed - No data to display ____________________________________________  EKG   ____________________________________________  RADIOLOGY  ED MD interpretation:    Official radiology report(s): No results found.  ____________________________________________   PROCEDURES  Procedure(s) performed (including Critical Care):  Procedures   ____________________________________________   INITIAL IMPRESSION / ASSESSMENT AND PLAN / ED COURSE  As part of my medical decision making, I reviewed the following data within the [place]       Patient presents for third rabies injection  secondary to being bit by while working.  Patient said no adverse reaction from previous injections.  Injection given today and patient advised to follow-up in 7 days for last injection.   Melinda [name] was evaluated in Emergency Department on 07/16/2019 for the symptoms described in the history of present illness. She was evaluated in the context of the global COVID-19 pandemic, which necessitated consideration that the patient might be at risk for infection with the SARS-CoV-2 virus that causes COVID-19. Institutional protocols and algorithms that pertain to the evaluation of patients at risk for COVID-19 are in a state of rapid change based on information released by regulatory bodies including the CDC and federal and state organizations. These policies and algorithms were followed during the patient's care in the ED.       ____________________________________________   FINAL CLINICAL IMPRESSION(S) / ED DIAGNOSES  Final diagnoses:  Rabies, need for prophylactic vaccination against     ED Discharge Orders    None       Note:  This document was prepared using Dragon voice recognition software and may include unintentional dictation errors.    Sable Feil, PA-C 07/16/19 0947    Lilia Pro., MD 07/16/19 [phone]

## 2019-07-16 NOTE — ED Triage Notes (Signed)
2nd dose of rabies vaccine

## 2019-07-23 ENCOUNTER — Emergency Department
Admission: EM | Admit: 2019-07-23 | Discharge: 2019-07-23 | Disposition: A | Discharge status: Home / Self Care | Payer: Medicare Other | Attending: Emergency Medicine | Admitting: Emergency Medicine

## 2019-07-23 ENCOUNTER — Other Ambulatory Visit: Payer: Self-pay

## 2019-07-23 ENCOUNTER — Encounter: Payer: Self-pay | Admitting: Physician Assistant

## 2019-07-23 DIAGNOSIS — N183 Chronic kidney disease, stage 3 unspecified: Secondary | ICD-10-CM | POA: Diagnosis not present

## 2019-07-23 DIAGNOSIS — Z79899 Other long term (current) drug therapy: Secondary | ICD-10-CM | POA: Diagnosis not present

## 2019-07-23 DIAGNOSIS — Z23 Encounter for immunization: Secondary | ICD-10-CM | POA: Diagnosis not present

## 2019-07-23 DIAGNOSIS — Z7982 Long term (current) use of aspirin: Secondary | ICD-10-CM | POA: Diagnosis not present

## 2019-07-23 DIAGNOSIS — Z203 Contact with and (suspected) exposure to rabies: Secondary | ICD-10-CM | POA: Insufficient documentation

## 2019-07-23 DIAGNOSIS — Z87891 Personal history of nicotine dependence: Secondary | ICD-10-CM | POA: Insufficient documentation

## 2019-07-23 DIAGNOSIS — E1122 Type 2 diabetes mellitus with diabetic chronic kidney disease: Secondary | ICD-10-CM | POA: Insufficient documentation

## 2019-07-23 MED ORDER — RABIES VACCINE, PCEC IM SUSR
1.0000 mL | Freq: Once | INTRAMUSCULAR | Status: AC
  Administered 2019-07-23: 1 mL via INTRAMUSCULAR
  Filled 2019-07-23: qty 1

## 2019-07-23 NOTE — ED Triage Notes (Signed)
states she is here for rabies vaccine

## 2019-07-23 NOTE — Discharge Instructions (Addendum)
You have completed your post-exposure vaccination series for rabies exposure.

## 2019-07-23 NOTE — ED Provider Notes (Signed)
[facility] Emergency Department Provider Note ____________________________________________  Time seen: 1135  I have reviewed the triage vital signs and the nursing notes.  HISTORY  Chief Complaint  Rabies Injection  HPI Melinda [name] is a 64 y.o. female presents as up to the ED for her fourth and final rabies vaccine for postexposure prophylaxis.  Patient denies any interim complaints.   Past Medical History:  Diagnosis Date  . Amputation of great toe, right, traumatic ([place]) 05/30/2010  . Amputation of second toe, right, traumatic ([place]) 09/2017  . Anemia    years ago  . Anxiety   . Blue toes    2nd toe on right foot, will get appt.  . Bulging disc   . CAD (coronary artery disease) 2009   s/p stent to LAD  . 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  Ablations done.  Now has rare episodes  . Chicken pox   . Chronic fatigue   . Chronic kidney disease    "problem with kidney filtration"  . Coronary arteritis   . Degenerative disc disease   . Depression   . Diabetes mellitus without complication ([place])   . Facet joint disease   . Fibromyalgia   . Heart disease   . Hyperlipidemia   . IBS (irritable bowel syndrome)   . MCL deficiency, knee   . MRSA (methicillin resistant staph aureus) culture positive 2011   GREAT TOE RIGHT FOOT  . Neuropathy 04/18/2010  . Peripheral neuropathy   . Renal insufficiency   . Restless leg syndrome   . Sepsis ([place]) 09/2013  . Sleep apnea 08/17/2003   uses CPAP, sleep study at [facility] (mild to moderate)  . Spinal stenosis     Patient Active Problem List   Diagnosis Date Noted  . Sensation disturbance of skin 07/08/2019  . Allodynia 07/08/2019  . Hyperalgesia 07/08/2019  . Chronic low back pain (Bilateral) w/o sciatica 06/23/2019  . DDD (degenerative disc disease), lumbosacral 06/23/2019  . 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 Spondylosis without myelopathy or  radiculopathy, lumbosacral region 11/05/2018  . CKD (chronic kidney disease) stage 3, GFR 30-59 ml/min 09/04/2018  . 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 08/06/2018  . DDD (degenerative disc disease), lumbar 05/15/2018  . Abnormal MRI, lumbar spine (04/01/2018) 05/06/2018  . Lumbar facet hypertrophy (Multilevel) 05/06/2018  . Lumbar foraminal stenosis 05/06/2018  . Lumbar lateral recess stenosis 05/06/2018  . Lumbar spinal stenosis w/ neurogenic claudication 04/09/2018  . Degenerative arthritis of knee (Bilateral) 03/12/2018  . Lumbar spondylosis 01/23/2018  . Hip pain, acute, left 01/01/2018  . Other secondary hypertension 08/09/2017  . OSA (obstructive sleep apnea) 08/09/2017  . Osteoarthritis of knee (Bilateral) (R>L) 06/08/2016  . Long term current use of opiate analgesic 04/21/2016  . Long term prescription opiate use 04/21/2016  . Opiate use 04/21/2016  . Encounter for adjustment or management of infusion pump 04/21/2016  . Osteoarthritis, multiple sites 04/21/2016  . Chronic musculoskeletal pain 04/21/2016  . Anxiety and depression 09/11/2015  . Opioid-induced constipation (OIC) 07/27/2015  . Chronic knee pain (Primary Area of Pain) (Right) 07/27/2015  . Osteoarthritis of knee (Left) 07/27/2015  . 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 Chronic neck pain 05/24/2015  . Chronic low back pain (Bilateral) (L>R) w/ sciatica (Right) 05/24/2015  . Epidural fibrosis 05/24/2015  . Cervical spondylosis 05/24/2015  . Neuropathic pain 05/24/2015  .  Lumbar facet syndrome 05/24/2015  . Fibromyalgia 05/24/2015  . Presence of functional implant (Medtronic programmable intrathecal pump) (Right abdominal area) 05/24/2015  . Presence of intrathecal pump (Medtronic intrathecal programmable pump) (40 mL pump) 05/24/2015  . Cervical spondylosis 05/24/2015  . Type 2 diabetes mellitus with diabetic  mononeuropathy, without long-term current use of insulin ([place]) 04/27/2014  . Pure hypercholesterolemia 03/02/2014  . CAD (coronary artery disease) 10/04/2013  . Atherosclerotic heart disease of native coronary artery without angina pectoris 10/04/2013  . Chronic pain syndrome 04/18/2010    Past Surgical History:  Procedure Laterality Date  . ABLATION     UTERUS  . ABLATION     HEART  . AMPUTATION TOE Right 02/01/2017   Procedure: AMPUTATION TOE-RIGHT 2ND MPJ;  Surgeon: Albertine Patricia, DPM;  Location: [facility];  Service: Podiatry;  Laterality: Right;  . ANTERIOR CERVICAL DECOMP/DISCECTOMY FUSION N/A 07/28/2014   Procedure: ANTERIOR CERVICAL DECOMPRESSION/DISCECTOMY FUSION CERVICAL 3-4,4-5,5-6 LEVELS WITH INSTRUMENTATION AND ALLOGRAFT;  Surgeon: Sinclair Ship, MD;  Location: [place];  Service: Orthopedics;  Laterality: N/A;  Anterior cervical decompression fusion, cervical 3-4, cervical 4-5, cervical 5-6 with instrumentation and allograft  . BACK SURGERY     X 3 1979, 1994, 1995  . CARPAL TUNNEL RELEASE Right   . CHOLECYSTECTOMY  2003  . CORONARY ANGIOPLASTY  2008  . EYE SURGERY Bilateral 2013   Eyelid lift   . FOOT SURGERY Right    BIG TOE  . FOOT SURGERY Bilateral    PLANTAR FASCIITIS  . FOOT SURGERY Right    2ND TOE  . GALLBLADDER SURGERY    . HAMMER TOE SURGERY Right 10/17/2017   Procedure: HAMMER TOE CORRECTION-4TH TOE;  Surgeon: Albertine Patricia, DPM;  Location: [place];  Service: Podiatry;  Laterality: Right;  LMA- WITH LOCAL Diabetic - diet controlled  . HAND SURGERY Left   . HAND SURGEY Left   . HEART STENT  2009   LAD  . INFUSION PUMP IMPLANTATION     X2 with morphine and baclofen  . INTRATHECAL PUMP REVISION N/A 04/25/2018   Procedure: Intrathecal pump replacement;  Surgeon: Clydell Hakim, MD;  Location: [place];  Service: Neurosurgery;  Laterality: N/A;  right  . INTRATHECAL PUMP REVISION Right 04/25/2018  . INTRATHECAL PUMP REVISION N/A 12/12/2018    Procedure: Intrathecal pump revision with exploration of pocket;  Surgeon: Clydell Hakim, MD;  Location: [place];  Service: Neurosurgery;  Laterality: N/A;  Intrathecal pump revision with exploration of pocket  . IRRIGATION AND DEBRIDEMENT FOOT Right 02/23/2017   Procedure: IRRIGATION AND DEBRIDEMENT FOOT-right foot;  Surgeon: Samara Deist, DPM;  Location: [facility];  Service: Podiatry;  Laterality: Right;  . PAIN PUMP REVISION N/A 08/15/2018   Procedure: Intrathecal PUMP REVISION;  Surgeon: Clydell Hakim, MD;  Location: [place];  Service: Neurosurgery;  Laterality: N/A;  INTRATHECAL PUMP REVISION  . TOE SURGERY     then revision 8/18    Prior to Admission medications   Medication Sig Start Date End Date Taking? Authorizing Provider  ACCU-CHEK AVIVA PLUS test strip USE TO CHECK BLOOD SUGAR ONE TIME A DAY 07/02/19   Baity, Coralie Keens, NP  ACCU-CHEK SOFTCLIX LANCETS lancets USE TO CHECK BLOOD SUGAR 1 TIME DAILY. DX: N56.21 09/01/18   Jearld Fenton, NP  AMBULATORY NON FORMULARY MEDICATION Medication Name: CPAP MASK OF CHOICE FOR HOME DEVICE 07/01/15   Laverle Hobby, MD  aspirin 81 MG tablet Take 81 mg by mouth daily.    [provider]  buPROPion (WELLBUTRIN XL) 150 MG 24 hr tablet TAKE 1 TABLET BY MOUTH EVERY DAY 05/19/19   Baity, Coralie Keens, NP  Cinnamon 500 MG TABS Take 1,000 mg by mouth daily.    [provider]  Coenzyme Q10 (CO Q 10) 100 MG CAPS Take 100 mg by mouth daily.     [provider]  fluticasone (FLONASE) 50 MCG/ACT nasal spray Place 2 sprays into both nostrils daily. 03/16/19   [provider]  gentamicin ointment (GARAMYCIN) 0.1 % APPLY TO NOSE THREE TIMES DAILY 03/16/19   [provider]  glucosamine-chondroitin 500-400 MG tablet Take 2 tablets by mouth daily.     [provider]  Magnesium 250 MG TABS Take 250 mg by mouth daily.    [provider]  Misc Natural Products (TART CHERRY ADVANCED PO) Take 1,200 mg by mouth  at bedtime.     [provider]  Multiple Vitamin (MULTIVITAMIN) tablet Take 1 tablet by mouth daily.    [provider]  naloxone [facility]) 2 MG/2ML injection Inject 1 mL (1 mg total) into the muscle as needed for up to 2 doses (for opioid overdose). Inject content of syringe into thigh muscle. Call 911. 08/06/18   [name], MD  nitroGLYCERIN (NITROSTAT) 0.4 MG SL tablet Place 1 tablet (0.4 mg total) under the tongue every 5 (five) minutes as needed for chest pain. Please keep upcoming appt in October. Thank you 03/05/19   Jerline Pain, MD  NONFORMULARY OR COMPOUNDED ITEM 212.8 mcg by Epidural Infusion route. Medtronic Neuromodulation pump Fentanyl 212.13mcg/day via IT pump, Baclofen, bupivicaine    [provider]  NONFORMULARY OR COMPOUNDED ITEM 88.77 mcg by Epidural Infusion route Continuous EPIDURAL. Medtronic Neuromodulation pump: Fentanyl, baclofen 88.77, bupivicane    [provider]  NONFORMULARY OR COMPOUNDED ITEM 5.918 mg by Epidural Infusion route Continuous EPIDURAL. Medtronic Neuromodulation pump: Fentanyl, Baclofen, Bupivicaine 5.918 mg    [provider]  NONFORMULARY OR COMPOUNDED ITEM Sig: Apply 1-2 gm(s) (2-4 pumps) to affected area, 3-4 times/day. (1 pump = 0.5 gm) 07/08/19 10/06/19  [name], MD  Omega-3 Fatty Acids (FISH OIL) 1000 MG CAPS Take 2,000 mg by mouth 2 (two) times daily.     [provider]  oxyCODONE (OXY IR/ROXICODONE) 5 MG immediate release tablet Take 1 tablet (5 mg total) by mouth every 8 (eight) hours as needed for severe pain. Must last 30 days. 07/05/19 08/04/19  [name], MD  oxyCODONE (OXY IR/ROXICODONE) 5 MG immediate release tablet Take 1 tablet (5 mg total) by mouth every 8 (eight) hours as needed for severe pain. Must last 30 days. 08/04/19 09/03/19  [name], MD  oxyCODONE (OXY IR/ROXICODONE) 5 MG immediate release tablet Take 1 tablet (5 mg total) by mouth every 8 (eight) hours as  needed for severe pain. Must last 30 days. 09/03/19 10/03/19  [name], MD  PARoxetine (PAXIL) 20 MG tablet TAKE ONE-HALF OF A TABLET BY MOUTH DAILY IN THE MORNING 12/15/18   Jearld Fenton, NP  polyethylene glycol (MIRALAX / GLYCOLAX) packet Take 17 g by mouth daily.     [provider]  pregabalin (LYRICA) 150 MG capsule Take 1 capsule (150 mg total) by mouth 3 (three) times daily. 05/06/19 11/02/19  [name], MD  rosuvastatin (CRESTOR) 20 MG tablet TAKE 1 TABLET BY MOUTH EVERY DAY 06/17/19   Jerline Pain, MD  tiZANidine (ZANAFLEX) 4 MG tablet Take 1 tablet (4 mg total) by mouth 3 (three) times  daily. 05/06/19 11/02/19  [name], MD  Turmeric 500 MG TABS Take 500 mg by mouth 2 (two) times daily.     [provider]  VITAMIN E PO Take 450 Units by mouth daily.     [provider]    Allergies Patient has no known allergies.  Family History  Problem Relation Age of Onset  . Lung cancer Mother   . Diabetes Father   . Heart disease Maternal Grandfather   . Diabetes Paternal Grandmother   . Colon cancer Paternal Grandfather   . Stroke Neg Hx     Social History Social History   Tobacco Use  . Smoking status: Former Smoker    Packs/day: 1.50    Years: 32.00    Pack years: 48.00    Types: Cigarettes    Quit date: 07/22/2007    Years since quitting: 12.0  . Smokeless tobacco: Never Used  Substance Use Topics  . Alcohol use: Yes    Alcohol/week: 0.0 standard drinks    Comment: occ - Holidays  . Drug use: No    Review of Systems  Constitutional: Negative for fever. Eyes: Negative for visual changes. ENT: Negative for sore throat. Cardiovascular: Negative for chest pain. Respiratory: Negative for shortness of breath. Gastrointestinal: Negative for abdominal pain, vomiting and diarrhea. Genitourinary: Negative for dysuria. Musculoskeletal: Negative for back pain. Skin: Negative for rash. Neurological: Negative for headaches,  focal weakness or numbness. ____________________________________________  PHYSICAL EXAM:  VITAL SIGNS: ED Triage Vitals  Enc Vitals Group     BP      Pulse      Resp      Temp      Temp src      SpO2      Weight      Height      Head Circumference      Peak Flow      Pain Score      Pain Loc      Pain Edu?      Excl. in [place]?     Constitutional: Alert and oriented. Well appearing and in no distress. Head: Normocephalic and atraumatic. Eyes: Conjunctivae are normal. Normal extraocular movements Cardiovascular: Normal rate, regular rhythm. Normal distal pulses. Respiratory: Normal respiratory effort. No wheezes/rales/rhonchi. Musculoskeletal: Nontender with normal range of motion in all extremities.  Neurologic:  Normal gait without ataxia. Normal speech and language. No gross focal neurologic deficits are appreciated. Skin:  Skin is warm, dry and intact. No rash noted. Psychiatric: Mood and affect are normal. Patient exhibits appropriate insight and judgment. ____________________________________________  PROCEDURES  Rabavert 1 ml IM Procedures ____________________________________________  INITIAL IMPRESSION / ASSESSMENT AND PLAN / ED COURSE  Patient with ED management of postexposure prophylaxis to rabies, after a provoked bite by a raccoon.  The raccoon was unavailable for capture and quarantine.  Patient has completed her series, and is here for a fourth and final vaccine dose.  Patient will be discharged at this time no further management is required.  Jerline Linzy was evaluated in Emergency Department on 07/23/2019 for the symptoms described in the history of present illness. She was evaluated in the context of the global COVID-19 pandemic, which necessitated consideration that the patient might be at risk for infection with the SARS-CoV-2 virus that causes COVID-19. Institutional protocols and algorithms that pertain to the evaluation of patients at risk for COVID-19  are in a state of rapid change based on information released by regulatory bodies including the CDC  and federal and state organizations. These policies and algorithms were followed during the patient's care in the ED. ____________________________________________  FINAL CLINICAL IMPRESSION(S) / ED DIAGNOSES  Final diagnoses:  Encounter for repeat administration of rabies vaccination      Melvenia Needles, PA-C 07/23/19 1138    Arta Silence, MD 07/23/19 1515

## 2019-07-27 ENCOUNTER — Other Ambulatory Visit: Payer: Self-pay

## 2019-07-27 MED ORDER — PAIN MANAGEMENT IT PUMP REFILL: 1.0000 | Freq: Once | INTRATHECAL | 0 refills | Status: AC

## 2019-07-29 ENCOUNTER — Encounter: Payer: Self-pay | Admitting: Internal Medicine

## 2019-08-02 ENCOUNTER — Other Ambulatory Visit: Payer: Self-pay | Admitting: Internal Medicine

## 2019-08-03 ENCOUNTER — Telehealth: Payer: Medicare Other | Admitting: Pain Medicine

## 2019-08-04 DIAGNOSIS — T888XXD Other specified complications of surgical and medical care, not elsewhere classified, subsequent encounter: Secondary | ICD-10-CM | POA: Diagnosis not present

## 2019-08-13 ENCOUNTER — Encounter: Payer: Self-pay | Admitting: Pain Medicine

## 2019-08-16 DIAGNOSIS — H02403 Unspecified ptosis of bilateral eyelids: Secondary | ICD-10-CM | POA: Diagnosis not present

## 2019-08-16 DIAGNOSIS — Z20822 Contact with and (suspected) exposure to covid-19: Secondary | ICD-10-CM | POA: Diagnosis not present

## 2019-08-17 ENCOUNTER — Encounter: Payer: Self-pay | Admitting: Internal Medicine

## 2019-08-17 MED ORDER — PAROXETINE HCL 20 MG PO TABS: ORAL_TABLET | ORAL | 0 refills | Status: DC

## 2019-08-18 DIAGNOSIS — Z87891 Personal history of nicotine dependence: Secondary | ICD-10-CM | POA: Diagnosis not present

## 2019-08-18 DIAGNOSIS — N183 Chronic kidney disease, stage 3 unspecified: Secondary | ICD-10-CM | POA: Diagnosis not present

## 2019-08-18 DIAGNOSIS — H02403 Unspecified ptosis of bilateral eyelids: Secondary | ICD-10-CM | POA: Diagnosis not present

## 2019-08-18 DIAGNOSIS — Z79899 Other long term (current) drug therapy: Secondary | ICD-10-CM | POA: Diagnosis not present

## 2019-08-18 DIAGNOSIS — Z7982 Long term (current) use of aspirin: Secondary | ICD-10-CM | POA: Diagnosis not present

## 2019-08-18 DIAGNOSIS — H02831 Dermatochalasis of right upper eyelid: Secondary | ICD-10-CM | POA: Diagnosis not present

## 2019-08-18 DIAGNOSIS — H02834 Dermatochalasis of left upper eyelid: Secondary | ICD-10-CM | POA: Diagnosis not present

## 2019-08-18 DIAGNOSIS — Z9989 Dependence on other enabling machines and devices: Secondary | ICD-10-CM | POA: Diagnosis not present

## 2019-08-18 DIAGNOSIS — G4730 Sleep apnea, unspecified: Secondary | ICD-10-CM | POA: Diagnosis not present

## 2019-08-18 DIAGNOSIS — E1140 Type 2 diabetes mellitus with diabetic neuropathy, unspecified: Secondary | ICD-10-CM | POA: Diagnosis not present

## 2019-08-18 DIAGNOSIS — Z955 Presence of coronary angioplasty implant and graft: Secondary | ICD-10-CM | POA: Diagnosis not present

## 2019-08-18 DIAGNOSIS — I2510 Atherosclerotic heart disease of native coronary artery without angina pectoris: Secondary | ICD-10-CM | POA: Diagnosis not present

## 2019-08-18 DIAGNOSIS — E1122 Type 2 diabetes mellitus with diabetic chronic kidney disease: Secondary | ICD-10-CM | POA: Diagnosis not present

## 2019-08-18 DIAGNOSIS — M1990 Unspecified osteoarthritis, unspecified site: Secondary | ICD-10-CM | POA: Diagnosis not present

## 2019-08-19 ENCOUNTER — Encounter: Payer: Self-pay | Admitting: Pain Medicine

## 2019-08-27 ENCOUNTER — Encounter: Payer: Self-pay | Admitting: Pain Medicine

## 2019-08-27 ENCOUNTER — Ambulatory Visit: Payer: Medicare Other | Attending: Pain Medicine | Admitting: Pain Medicine

## 2019-08-27 ENCOUNTER — Other Ambulatory Visit: Payer: Self-pay

## 2019-08-27 VITALS — BP 130/74 | HR 74 | Temp 97.5°F | Resp 16 | Ht 70.0 in | Wt 220.0 lb

## 2019-08-27 DIAGNOSIS — G894 Chronic pain syndrome: Secondary | ICD-10-CM | POA: Insufficient documentation

## 2019-08-27 DIAGNOSIS — M5137 Other intervertebral disc degeneration, lumbosacral region: Secondary | ICD-10-CM | POA: Diagnosis not present

## 2019-08-27 DIAGNOSIS — M961 Postlaminectomy syndrome, not elsewhere classified: Secondary | ICD-10-CM | POA: Diagnosis not present

## 2019-08-27 DIAGNOSIS — G8929 Other chronic pain: Secondary | ICD-10-CM | POA: Diagnosis not present

## 2019-08-27 DIAGNOSIS — M7918 Myalgia, other site: Secondary | ICD-10-CM

## 2019-08-27 DIAGNOSIS — M5441 Lumbago with sciatica, right side: Secondary | ICD-10-CM | POA: Insufficient documentation

## 2019-08-27 DIAGNOSIS — Z978 Presence of other specified devices: Secondary | ICD-10-CM | POA: Insufficient documentation

## 2019-08-27 DIAGNOSIS — M797 Fibromyalgia: Secondary | ICD-10-CM | POA: Diagnosis not present

## 2019-08-27 DIAGNOSIS — M25561 Pain in right knee: Secondary | ICD-10-CM | POA: Diagnosis not present

## 2019-08-27 DIAGNOSIS — Z451 Encounter for adjustment and management of infusion pump: Secondary | ICD-10-CM | POA: Diagnosis not present

## 2019-08-27 MED ORDER — PREGABALIN 150 MG PO CAPS: 150.0000 mg | ORAL_CAPSULE | Freq: Three times a day (TID) | ORAL | 1 refills | Status: DC

## 2019-08-27 MED ORDER — LIDOCAINE HCL 2 % IJ SOLN
INTRAMUSCULAR | Status: AC
  Filled 2019-08-27: qty 20

## 2019-08-27 MED ORDER — OXYCODONE HCL 5 MG PO TABS: 5.0000 mg | ORAL_TABLET | Freq: Three times a day (TID) | ORAL | 0 refills | Status: DC | PRN

## 2019-08-27 MED ORDER — TIZANIDINE HCL 4 MG PO TABS: 4.0000 mg | ORAL_TABLET | Freq: Three times a day (TID) | ORAL | 1 refills | Status: DC

## 2019-08-27 NOTE — Progress Notes (Signed)
Nursing Pain Medication Assessment:  Safety precautions to be maintained throughout the outpatient stay will include: orient to surroundings, keep bed in low position, maintain call bell within reach at all times, provide assistance with transfer out of bed and ambulation.  Medication Inspection Compliance: Pill count conducted under aseptic conditions, in front of the patient. Neither the pills nor the bottle was removed from the patient's sight at any time. Once count was completed pills were immediately returned to the patient in their original bottle.  Medication: Oxycodone IR Pill/Patch Count: 57 of 90 pills remain Pill/Patch Appearance: Markings consistent with prescribed medication Bottle Appearance: Standard pharmacy container. Clearly labeled. Filled Date: 02 / 11 / 2021 Last Medication intake:  Today

## 2019-08-27 NOTE — Progress Notes (Signed)
PROVIDER NOTE: Information contained herein reflects review and annotations entered in association with encounter. Interpretation of such information and data should be left to medically-trained personnel. Information provided to patient can be located elsewhere in the medical record under "Patient Instructions". Document created using STT-dictation technology, any transcriptional errors that may result from process are unintentional.    Patient: Melinda [name]  Service Category: Procedure  Provider: Gaspar Cola, MD  DOB: May 07, 1956  DOS: 08/27/2019  Location: [facility]  MRN: [id number]  Setting: Ambulatory - outpatient  Referring Provider: Jearld Fenton, NP  Type: Established Patient  Specialty: Interventional Pain Management  PCP: Jearld Fenton, NP   Primary Reason for Visit: Interventional Pain Management Treatment. CC: Back Pain (lower)  Procedure:          Intrathecal Drug Delivery System (IDDS):  Type: Reservoir Refill [phone]) No rate change Region: Abdominal Laterality: Right  Type of Pump: Medtronic Synchromed II Delivery Route: Intrathecal Type of Pain Treated: Neuropathic/Nociceptive Primary Medication Class: Opioid/opiate  Medication, Concentration, Infusion Program, & Delivery Rate: Please see scanned programming printout.   Indications: 1. Chronic pain syndrome   2. Failed back surgical syndrome   3. DDD (degenerative disc disease), lumbosacral   4. 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 Chronic knee pain (Primary Area of Pain) (Right)   6. Presence of intrathecal pump (Medtronic intrathecal programmable pump) (40 mL pump)   7. Encounter for adjustment or management of infusion pump    Pain Assessment: Self-Reported Pain Score: 3 /10             Reported level is compatible with observation.        NOTE: Today the patient was found to have some fluid accumulated around the pump.  Under sterile conditions the space  around the pump was accessed and a total of 217 mL of clear serous fluid was removed.  Pharmacotherapy Assessment  Analgesic: Oxycodone 5 mg, 1 tab PO q 8 hrs (15 mg/day of oxycodone) + Intrathecal PF-Fentanyl MME/day: 22.5 mg/day (oral).   Monitoring: [place] PMP: PDMP not reviewed this encounter.       Pharmacotherapy: No side-effects or adverse reactions reported. Compliance: No problems identified. Effectiveness: Clinically acceptable. Plan: Refer to "POC".  UDS:  Summary  Date Value Ref Range Status  07/24/2018 FINAL  Final    Comment:    ==================================================================== TOXASSURE SELECT 13 (MW) ==================================================================== Test                             Result       Flag       Units Drug Present and Declared for Prescription Verification   Oxycodone                      754          EXPECTED   ng/mg creat   Oxymorphone                    231          EXPECTED   ng/mg creat   Noroxycodone                   2463         EXPECTED   ng/mg creat   Noroxymorphone                 89  EXPECTED   ng/mg creat    Sources of oxycodone are scheduled prescription medications.    Oxymorphone, noroxycodone, and noroxymorphone are expected    metabolites of oxycodone. Oxymorphone is also available as a    scheduled prescription medication. Drug Present not Declared for Prescription Verification   Fentanyl                       4            UNEXPECTED ng/mg creat   Norfentanyl                    29           UNEXPECTED ng/mg creat    Source of fentanyl is a scheduled prescription medication,    including IV, patch, and transmucosal formulations. Norfentanyl    is an expected metabolite of fentanyl. ==================================================================== Test                      Result    Flag   Units      Ref Range   Creatinine              134              mg/dL       >=20 ==================================================================== Declared Medications:  The flagging and interpretation on this report are based on the  following declared medications.  Unexpected results may arise from  inaccuracies in the declared medications.  **Note: The testing scope of this panel includes these medications:  Oxycodone  **Note: The testing scope of this panel does not include following  reported medications:  Aspirin (Aspirin 81)  Bupropion (Wellbutrin)  Chondroitin (Glucosamine-Chondroitin)  Cinnamon  Glipizide (Glucotrol)  Glucosamine (Glucosamine-Chondroitin)  Magnesium  Multivitamin  Naloxone  Nitroglycerin (Nitrostat)  Paroxetine  Polyethylene Glycol (GlycoLAX)  Polyethylene Glycol (MiraLAX)  Pregabalin (Lyrica)  Rosuvastatin (Crestor)  Supplement (Omega-3)  Tizanidine (Zanaflex)  Turmeric  Ubiquinone (Coenzyme Q 10)  Vitamin D2 (Drisdol)  Vitamin E ==================================================================== For clinical consultation, please call [phone]. ====================================================================    Intrathecal Pump Therapy Assessment  Manufacturer: Medtronic Synchromed Type: Programmable Volume: 40 mL reservoir MRI compatibility: Yes   Drug content:  Primary Medication Class: Opioid Primary Medication:PF-Fentanyl(1056mg/mL)  Secondary Medication:PF-Bupivacaine('20mg'$ /mL) Other Medication:PF-Baclofen (2461m/mL)    Programming:  Type: Simple continuous. See pump readout for details.   Changes:  Medication Change: None at this point Rate Change: No change in rate  Reported side-effects or adverse reactions: None reported  Effectiveness: Described as relatively effective, allowing for increase in activities of daily living (ADL) Clinically meaningful improvement in function (CMIF): Sustained CMIF goals met  Plan: Pump refill today  Pre-op Assessment:  Melinda [name] a 64y.o. (year old), female patient, seen today for interventional treatment. 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 Ms. CoKosaas a current medication list which includes the following prescription(s): accu-chek aviva plus, accu-chek softclix lancets, AMBULATORY NON FORMULARY  MEDICATION, aspirin, bupropion, cephalexin, cinnamon, co q 10, erythromycin, fluticasone, gentamicin ointment, glucosamine-chondroitin, magnesium, misc natural products, multivitamin, naloxone, nitroglycerin, NONFORMULARY OR COMPOUNDED ITEM, NONFORMULARY OR COMPOUNDED ITEM, NONFORMULARY OR COMPOUNDED ITEM, NONFORMULARY OR COMPOUNDED ITEM, fish oil, [START ON 09/03/2019] oxycodone, [START ON 10/03/2019] oxycodone, [START ON 11/02/2019] oxycodone, paroxetine, polyethylene glycol, pregabalin, [START ON 11/02/2019] pregabalin, rosuvastatin, tizanidine, [START ON 11/02/2019] tizanidine, turmeric, and vitamin e. Her primarily concern today is the Back Pain (lower)  Initial Vital Signs:  Pulse/HCG Rate: 74  Temp: (!) 97.5 F (36.4 C) Resp: 16 BP: 130/74 SpO2: 96 %  BMI: Estimated body mass index is 31.57 kg/m as calculated from the following:   Height as of this encounter: '5\' 10"'$  (1.778 m).   Weight as of this encounter: 220 lb (99.8 kg).  Risk  Assessment: Allergies: Reviewed. She has No Known Allergies.  Allergy Precautions: None required Coagulopathies: Reviewed. None identified.  Blood-thinner therapy: None at this time Active Infection(s): Reviewed. None identified. Ms. Pollio is afebrile  Site Confirmation: Ms. Schmierer was asked to confirm the procedure and laterality before marking the site Procedure checklist: Completed Consent: Before the procedure and under the influence of no sedative(s), amnesic(s), or anxiolytics, the patient was informed of the treatment options, risks and possible complications. To fulfill our ethical and legal obligations, as recommended by the American Medical Association's Code of Ethics, I have informed the patient of my clinical impression; the nature and purpose of the treatment or procedure; the risks, benefits, and possible complications of the intervention; the alternatives, including doing nothing; the risk(s) and benefit(s) of the alternative treatment(s) or procedure(s); and the risk(s) and benefit(s) of doing nothing.  Ms. Kohls was provided with information about the general risks and possible complications associated with most interventional procedures. These include, but are not limited to: failure to achieve desired goals, infection, bleeding, organ or nerve damage, allergic reactions, paralysis, and/or death.  In addition, she was informed of those risks and possible complications associated to this particular procedure, which include, but are not limited to: damage to the implant; failure to decrease pain; local, systemic, or serious CNS infections, intraspinal abscess with possible cord compression and paralysis, or life-threatening such as meningitis; bleeding; organ damage; nerve injury or damage with subsequent sensory, motor, and/or autonomic system dysfunction, resulting in transient or permanent pain, numbness, and/or weakness of one or several areas of the body; allergic reactions,  either minor or major life-threatening, such as anaphylactic or anaphylactoid reactions.  Furthermore, Ms. Lablanc was informed of those risks and complications associated with the medications. These include, but are not limited to: allergic reactions (i.e.: anaphylactic or anaphylactoid reactions); endorphine suppression; bradycardia and/or hypotension; water retention and/or peripheral vascular relaxation leading to lower extremity edema and possible stasis ulcers; respiratory depression and/or shortness of breath; decreased metabolic rate leading to weight gain; swelling or edema; medication-induced neural toxicity; particulate matter embolism and blood vessel occlusion with resultant organ, and/or nervous system infarction; and/or intrathecal granuloma formation with possible spinal cord compression and permanent paralysis.  Before refilling the pump Ms. Darnell was informed that some of the medications used in the devise may not be FDA approved for such use and therefore it constitutes an off-label use of the medications.  Finally, she was informed that Medicine is not an exact science; therefore, there is also the possibility of unforeseen or unpredictable risks and/or possible complications that may result in a catastrophic outcome. The patient indicated having understood very clearly. We have given the patient no guarantees  and we have made no promises. Enough time was given to the patient to ask questions, all of which were answered to the patient's satisfaction. Ms. Louvier has indicated that she wanted to continue with the procedure. Attestation: I, the ordering provider, attest that I have discussed with the patient the benefits, risks, side-effects, alternatives, likelihood of achieving goals, and potential problems during recovery for the procedure that I have provided informed consent. Date  Time: 08/27/2019 11:20 AM  Pre-Procedure Preparation:  Monitoring: As per clinic protocol.  Respiration, ETCO2, SpO2, BP, heart rate and rhythm monitor placed and checked for adequate function Safety Precautions: Patient was assessed for positional comfort and pressure points before starting the procedure. Time-out: I initiated and conducted the "Time-out" before starting the procedure, as per protocol. The patient was asked to participate by confirming the accuracy of the "Time Out" information. Verification of the correct person, site, and procedure were performed and confirmed by me, the nursing staff, and the patient. "Time-out" conducted as per Joint Commission's Universal Protocol (UP.01.01.01). Time:    Description of Procedure:          Position: Supine Target Area: Central-port of intrathecal pump. Approach: Anterior, 90 degree angle approach. Area Prepped: Entire Area around the pump implant. Prepping solution: DuraPrep (Iodine Povacrylex [0.7% available iodine] and Isopropyl Alcohol, 74% w/w) Safety Precautions: Aspiration looking for blood return was conducted prior to all injections. At no point did we inject any substances, as a needle was being advanced. No attempts were made at seeking any paresthesias. Safe injection practices and needle disposal techniques used. Medications properly checked for expiration dates. SDV (single dose vial) medications used. Description of the Procedure: Protocol guidelines were followed. Two nurses trained to do implant refills were present during the entire procedure. The refill medication was checked by both healthcare providers as well as the patient. The patient was included in the "Time-out" to verify the medication. The patient was placed in position. The pump was identified. The area was prepped in the usual manner. The sterile template was positioned over the pump, making sure the side-port location matched that of the pump. Both, the pump and the template were held for stability. The needle provided in the Medtronic Kit was then introduced  thru the center of the template and into the central port. The pump content was aspirated and discarded volume documented. The new medication was slowly infused into the pump, thru the filter, making sure to avoid overpressure of the device. The needle was then removed and the area cleansed, making sure to leave some of the prepping solution back to take advantage of its long term bactericidal properties. The pump was interrogated and programmed to reflect the correct medication, volume, and dosage. The program was printed and taken to the physician for approval. Once checked and signed by the physician, a copy was provided to the patient and another scanned into the EMR.  Vitals:   08/27/19 1117  BP: 130/74  Pulse: 74  Resp: 16  Temp: (!) 97.5 F (36.4 C)  SpO2: 96%  Weight: 220 lb (99.8 kg)  Height: '5\' 10"'$  (1.778 m)   Materials & Medications: Medtronic Refill Kit Medication(s): Please see chart orders for details.  Imaging Guidance:          Type of Imaging Technique: None used Indication(s): N/A Exposure Time: No patient exposure Contrast: None used. Fluoroscopic Guidance: N/A Ultrasound Guidance: N/A Interpretation: N/A  Antibiotic Prophylaxis:   Anti-infectives (From admission, onward)   None  Indication(s): None identified  Post-operative Assessment:  Post-procedure Vital Signs:  Pulse/HCG Rate: 74  Temp: (!) 97.5 F (36.4 C) Resp: 16 BP: 130/74 SpO2: 96 %  EBL: None  Complications: No immediate post-treatment complications observed by team, or reported by patient.  Note: The patient tolerated the entire procedure well. A repeat set of vitals were taken after the procedure and the patient was kept under observation following institutional policy, for this type of procedure. Post-procedural neurological assessment was performed, showing return to baseline, prior to discharge. The patient was provided with post-procedure discharge instructions, including a section  on how to identify potential problems. Should any problems arise concerning this procedure, the patient was given instructions to immediately contact us, at any time, without hesitation. In any case, we plan to contact the patient by telephone for a follow-up status report regarding this interventional procedure.  Comments:  No additional relevant information.  Plan of Care  Orders:  Orders Placed This Encounter  Procedures  . PUMP REFILL    Maintain Protocol by having two(2) healthcare providers during procedure and programming.    Scheduling Instructions:     Please refill intrathecal pump today.    Order Specific Question:   Where will this procedure be performed?    Answer:   [facility]  . PUMP REFILL    Whenever possible schedule on a procedure today.    Standing Status:   Future    Standing Expiration Date:   01/24/2020    Scheduling Instructions:     Please schedule intrathecal pump refill based on pump programming. Avoid schedule intervals of more than 120 days (4 months).    Order Specific Question:   Where will this procedure be performed?    Answer:   [facility]  . Informed Consent Details: Physician/Practitioner Attestation; Transcribe to consent form and obtain patient signature    Provider Attestation: I, [place] Dossie Arbour, MD, (Pain Management Specialist), the physician/practitioner, attest that I have discussed with the patient the benefits, risks, side effects, alternatives, likelihood of achieving goals and potential problems during recovery for the procedure that I have provided informed consent.    Scheduling Instructions:     Procedure: Intrathecal Pump Refill     Attending Physician: Beatriz Chancellor A. Dossie Arbour, MD     Indications: Chronic Pain Syndrome (G89.4)     Transcribe to consent form and obtain patient signature.   Chronic Opioid Analgesic:  Oxycodone 5 mg, 1 tab PO q 8 hrs (15 mg/day of oxycodone) + Intrathecal PF-Fentanyl MME/day: 22.5  mg/day (oral).   Medications ordered for procedure: Meds ordered this encounter  Medications  . tiZANidine (ZANAFLEX) 4 MG tablet    Sig: Take 1 tablet (4 mg total) by mouth 3 (three) times daily.    Dispense:  270 tablet    Refill:  1    Fill one day early if pharmacy is closed on scheduled refill date. May substitute for generic if available.  . pregabalin (LYRICA) 150 MG capsule    Sig: Take 1 capsule (150 mg total) by mouth 3 (three) times daily.    Dispense:  270 capsule    Refill:  1    Fill one day early if pharmacy is closed on scheduled refill date. May substitute for generic if available.  Marland Kitchen oxyCODONE (OXY IR/ROXICODONE) 5 MG immediate release tablet    Sig: Take 1 tablet (5 mg total) by mouth every 8 (eight) hours as needed for severe pain. Must last 30 days.  Dispense:  90 tablet    Refill:  0    Chronic Pain: STOP Act (Not applicable) Fill 1 day early if closed on refill date. Do not fill until: 10/03/2019. To last until: 11/02/2019. Avoid benzodiazepines within 8 hours of opioids  . oxyCODONE (OXY IR/ROXICODONE) 5 MG immediate release tablet    Sig: Take 1 tablet (5 mg total) by mouth every 8 (eight) hours as needed for severe pain. Must last 30 days.    Dispense:  90 tablet    Refill:  0    Chronic Pain: STOP Act (Not applicable) Fill 1 day early if closed on refill date. Do not fill until: 11/02/2019. To last until: 12/02/2019. Avoid benzodiazepines within 8 hours of opioids   Medications administered: Melinda [name] had no medications administered during this visit.  See the medical record for exact dosing, route, and time of administration.  Follow-up plan:   Return for Pump Refill (Max:17mo.       Interventional treatment options:  Under consideration: Possible left lumbar facet RFA  Possible right lumbar facet RFA  Diagnostic caudal ESI + diagnostic epidurogram Possible Racz procedure Diagnostic bilateral IA knee injection (Steroid) Diagnostic bilateral  genicular NB Possible bilateral genicular nerve RFA Possible bilateral Hyalgan knee injection DiagnosticcervicalESI Diagnostic bilateral cervical facet block Possible bilateral cervical facet RFA   Therapeutic/palliative (PRN): Palliative/therapeutic intrathecal pump management (refills/programming adjustments)  Palliative left L2-3 LESI #2 Palliative right lumbar facet block #4  Diagnostic left lumbar facet block #2     Recent Visits Date Type Provider Dept  07/08/19 Telemedicine NMilinda Pointer MD [facility]  06/23/19 Procedure visit NMilinda Pointer MD [facility]  06/03/19 Procedure visit NMilinda Pointer MD [facility]  Showing recent visits within past 90 days and meeting all other requirements   Today's Visits Date Type Provider Dept  08/27/19 Procedure visit NMilinda Pointer MD [facility]  Showing today's visits and meeting all other requirements   Future Appointments No visits were found meeting these conditions.  Showing future appointments within next 90 days and meeting all other requirements   Disposition: Discharge home  Discharge (Date  Time): 08/27/2019; 1209 hrs.   Primary Care Physician: BJearld Fenton NP [facility] Note by: FGaspar Cola MD Date: 08/27/2019; Time: 12:40 PM  Disclaimer:  Medicine is not an eChief Strategy Officer The only guarantee in medicine is that nothing is guaranteed. It is important to note that the decision to proceed with this intervention was based on the information collected from the patient. The Data and conclusions were drawn from the patient's questionnaire, the interview, and the physical examination. Because the information was provided in large part by the patient, it cannot be guaranteed that it has not been purposely or unconsciously manipulated. Every effort has been made to obtain as much relevant data as possible for  this evaluation. It is important to note that the conclusions that lead to this procedure are derived in large part from the available data. Always take into account that the treatment will also be dependent on availability of resources and existing treatment guidelines, considered by other Pain Management Practitioners as being common knowledge and practice, at the time of the intervention. For Medico-Legal purposes, it is also important to point out that variation in procedural techniques and pharmacological choices are the acceptable norm. The indications, contraindications, technique, and results of the above procedure should only be interpreted and judged by a Board-Certified Interventional Pain Specialist with  extensive familiarity and expertise in the same exact procedure and technique.

## 2019-08-28 ENCOUNTER — Telehealth: Payer: Self-pay

## 2019-08-28 NOTE — Telephone Encounter (Signed)
Post procedure phone call.  Patient staes states she is doing good.

## 2019-09-01 MED FILL — Medication: INTRATHECAL | Qty: 1 | Status: AC

## 2019-09-17 ENCOUNTER — Encounter: Payer: Self-pay | Admitting: Pain Medicine

## 2019-09-17 DIAGNOSIS — R2689 Other abnormalities of gait and mobility: Secondary | ICD-10-CM | POA: Diagnosis not present

## 2019-09-19 ENCOUNTER — Ambulatory Visit: Payer: Medicare Other | Attending: Internal Medicine

## 2019-09-19 DIAGNOSIS — Z23 Encounter for immunization: Secondary | ICD-10-CM

## 2019-09-19 NOTE — Progress Notes (Signed)
   Covid-19 Vaccination Clinic  Name:  Melinda [name]    MRN: [id number] DOB: 04/10/1956  09/19/2019  Melinda [name] was observed post Covid-19 immunization for 15 minutes without incident. She was provided with Vaccine Information Sheet and instruction to access the V-Safe system.   Melinda [name] was instructed to call 911 with any severe reactions post vaccine: Marland Kitchen Difficulty breathing  . Swelling of face and throat  . A fast heartbeat  . A bad rash all over body  . Dizziness and weakness   Immunizations Administered    Name Date Dose VIS Date Route   Pfizer COVID-19 Vaccine 09/19/2019 12:28 PM 0.3 mL 06/12/2019 Intramuscular   Manufacturer: [place]   Lot: [id number]   [place]: [id number]

## 2019-09-24 DIAGNOSIS — T888XXD Other specified complications of surgical and medical care, not elsewhere classified, subsequent encounter: Secondary | ICD-10-CM | POA: Diagnosis not present

## 2019-10-06 DIAGNOSIS — H04123 Dry eye syndrome of bilateral lacrimal glands: Secondary | ICD-10-CM | POA: Diagnosis not present

## 2019-10-13 ENCOUNTER — Ambulatory Visit: Payer: Medicare Other | Attending: Internal Medicine

## 2019-10-13 DIAGNOSIS — E1142 Type 2 diabetes mellitus with diabetic polyneuropathy: Secondary | ICD-10-CM | POA: Diagnosis not present

## 2019-10-13 DIAGNOSIS — Z23 Encounter for immunization: Secondary | ICD-10-CM

## 2019-10-13 DIAGNOSIS — Z89421 Acquired absence of other right toe(s): Secondary | ICD-10-CM | POA: Diagnosis not present

## 2019-10-13 DIAGNOSIS — B351 Tinea unguium: Secondary | ICD-10-CM | POA: Diagnosis not present

## 2019-10-13 DIAGNOSIS — L851 Acquired keratosis [keratoderma] palmaris et plantaris: Secondary | ICD-10-CM | POA: Diagnosis not present

## 2019-10-13 NOTE — Progress Notes (Signed)
   Covid-19 Vaccination Clinic  Name:  Melinda [name]    MRN: [id number] DOB: Dec 01, 1955  10/13/2019  Melinda [name] was observed post Covid-19 immunization for 15 minutes without incident. She was provided with Vaccine Information Sheet and instruction to access the V-Safe system.   Melinda [name] was instructed to call 911 with any severe reactions post vaccine: Marland Kitchen Difficulty breathing  . Swelling of face and throat  . A fast heartbeat  . A bad rash all over body  . Dizziness and weakness   Immunizations Administered    Name Date Dose VIS Date Route   Pfizer COVID-19 Vaccine 10/13/2019  1:47 PM 0.3 mL 06/12/2019 Intramuscular   Manufacturer: [place]   Lot: [id number]   [place]: [id number]

## 2019-10-27 DIAGNOSIS — T888XXD Other specified complications of surgical and medical care, not elsewhere classified, subsequent encounter: Secondary | ICD-10-CM | POA: Diagnosis not present

## 2019-10-28 ENCOUNTER — Other Ambulatory Visit: Payer: Self-pay

## 2019-10-28 ENCOUNTER — Encounter: Payer: Self-pay | Admitting: Family Medicine

## 2019-10-28 ENCOUNTER — Ambulatory Visit (INDEPENDENT_AMBULATORY_CARE_PROVIDER_SITE_OTHER): Payer: Medicare Other | Admitting: Family Medicine

## 2019-10-28 DIAGNOSIS — M170 Bilateral primary osteoarthritis of knee: Secondary | ICD-10-CM

## 2019-10-28 MED ORDER — PAIN MANAGEMENT IT PUMP REFILL: 1.0000 | Freq: Once | INTRATHECAL | 0 refills | Status: AC

## 2019-10-28 NOTE — Patient Instructions (Addendum)
Drained knee today Exercises 3x  See me again in 5-6 weeks

## 2019-10-28 NOTE — Progress Notes (Signed)
[facility] [place] Phone: [phone] Subjective:   [name], am serving as a scribe for Dr. Hulan Saas. This visit occurred during the SARS-CoV-2 public health emergency.  Safety protocols were in place, including screening questions prior to the visit, additional usage of staff PPE, and extensive cleaning of exam room while observing appropriate contact time as indicated for disinfecting solutions.   I'm seeing this patient by the request  of:  Jearld Fenton, NP  CC: Right knee pain and swelling  MWU:[id number]   07/07/2019 Significant improvement at this time.  No need to make any significant changes otherwise.  Patient will follow up with me on an as-needed basis.  Update 10/28/2019 Melinda [name] is a 64 y.o. female coming in with complaint of bilateral knee pain. Patient states that she has had swelling for about 3 months. Pain began one month ago. Pain over medial aspect of knee.  Severe arthritic changes.  Continues to have recurrent swelling.  Has not been wearing the brace on a regular basis due to how uncomfortable it can be from time to time.  Patient states that recently the pain has become so swollen now and is starting to stop her from activities.       Past Medical History:  Diagnosis Date  . 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 Amputation of second toe, right, traumatic ([place]) 09/2017  . Anemia    years ago  . Anxiety   . Blue toes    2nd toe on right foot, will get appt.  . Bulging disc   . CAD (coronary artery disease) 2009   s/p stent to LAD  . 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  Ablations done.  Now has rare episodes  . Chicken pox   . Chronic fatigue   . Chronic kidney disease    "problem with kidney filtration"  . Coronary arteritis   . Degenerative disc disease   . Depression   . Diabetes mellitus without complication ([place])   .  Facet joint disease   . Fibromyalgia   . Heart disease   . Hyperlipidemia   . IBS (irritable bowel syndrome)   . MCL deficiency, knee   . MRSA (methicillin resistant staph aureus) culture positive 2011   GREAT TOE RIGHT FOOT  . Neuropathy 04/18/2010  . Peripheral neuropathy   . Renal insufficiency   . Restless leg syndrome   . Sepsis ([place]) 09/2013  . Sleep apnea 08/17/2003   uses CPAP, sleep study at [facility] (mild to moderate)  . Spinal stenosis    Past Surgical History:  Procedure Laterality Date  . ABLATION     UTERUS  . ABLATION     HEART  . AMPUTATION TOE Right 02/01/2017   Procedure: AMPUTATION TOE-RIGHT 2ND MPJ;  Surgeon: Albertine Patricia, DPM;  Location: [facility];  Service: Podiatry;  Laterality: Right;  . ANTERIOR CERVICAL DECOMP/DISCECTOMY FUSION N/A 07/28/2014   Procedure: ANTERIOR CERVICAL DECOMPRESSION/DISCECTOMY FUSION CERVICAL 3-4,4-5,5-6 LEVELS WITH INSTRUMENTATION AND ALLOGRAFT;  Surgeon: Sinclair Ship, MD;  Location: [place];  Service: Orthopedics;  Laterality: N/A;  Anterior cervical decompression fusion, cervical 3-4, cervical 4-5, cervical 5-6 with instrumentation and allograft  . BACK SURGERY     X 3 1979, 1994, 1995  . CARPAL TUNNEL RELEASE Right   . CHOLECYSTECTOMY  2003  . CORONARY ANGIOPLASTY  2008  . EYE SURGERY Bilateral 2013  Eyelid lift   . FOOT SURGERY Right    BIG TOE  . FOOT SURGERY Bilateral    PLANTAR FASCIITIS  . FOOT SURGERY Right    2ND TOE  . GALLBLADDER SURGERY    . HAMMER TOE SURGERY Right 10/17/2017   Procedure: HAMMER TOE CORRECTION-4TH TOE;  Surgeon: Albertine Patricia, DPM;  Location: [place];  Service: Podiatry;  Laterality: Right;  LMA- WITH LOCAL Diabetic - diet controlled  . HAND SURGERY Left   . HAND SURGEY Left   . HEART STENT  2009   LAD  . INFUSION PUMP IMPLANTATION     X2 with morphine and baclofen  . INTRATHECAL PUMP REVISION N/A 04/25/2018   Procedure: Intrathecal pump replacement;   Surgeon: Clydell Hakim, MD;  Location: [place];  Service: Neurosurgery;  Laterality: N/A;  right  . INTRATHECAL PUMP REVISION Right 04/25/2018  . INTRATHECAL PUMP REVISION N/A 12/12/2018   Procedure: Intrathecal pump revision with exploration of pocket;  Surgeon: Clydell Hakim, MD;  Location: [place];  Service: Neurosurgery;  Laterality: N/A;  Intrathecal pump revision with exploration of pocket  . IRRIGATION AND DEBRIDEMENT FOOT Right 02/23/2017   Procedure: IRRIGATION AND DEBRIDEMENT FOOT-right foot;  Surgeon: Samara Deist, DPM;  Location: [facility];  Service: Podiatry;  Laterality: Right;  . PAIN PUMP REVISION N/A 08/15/2018   Procedure: Intrathecal PUMP REVISION;  Surgeon: Clydell Hakim, MD;  Location: [place];  Service: Neurosurgery;  Laterality: N/A;  INTRATHECAL PUMP REVISION  . TOE SURGERY     then revision 8/18   Social History   Socioeconomic History  . Marital status: Significant Other    Spouse name: Not on file  . Number of children: Not on file  . Years of education: Not on file  . Highest education level: Not on file  Occupational History  . Not on file  Tobacco Use  . Smoking status: Former Smoker    Packs/day: 1.50    Years: 32.00    Pack years: 48.00    Types: Cigarettes    Quit date: 07/22/2007    Years since quitting: 12.2  . Smokeless tobacco: Never Used  Substance and Sexual Activity  . Alcohol use: Yes    Alcohol/week: 0.0 standard drinks    Comment: occ - Holidays  . Drug use: No  . Sexual activity: Yes  Other Topics Concern  . Not on file  Social History Narrative   Lives at home with a partner. Independent at baseline   Social Determinants of Health   Financial Resource Strain:   . Difficulty of Paying Living Expenses:   Food Insecurity:   . Worried About Charity fundraiser in the Last Year:   . Arboriculturist in the Last Year:   Transportation Needs:   . Film/video editor (Medical):   Marland Kitchen Lack of Transportation (Non-Medical):   Physical  Activity:   . Days of Exercise per Week:   . Minutes of Exercise per Session:   Stress:   . Feeling of Stress :   Social Connections:   . Frequency of Communication with Friends and Family:   . Frequency of Social Gatherings with Friends and Family:   . Attends Religious Services:   . Active Member of Clubs or Organizations:   . Attends Archivist Meetings:   Marland Kitchen Marital Status:    No Known Allergies Family History  Problem Relation Age of Onset  . Lung cancer Mother   . Diabetes Father   .  Heart disease Maternal Grandfather   . Diabetes Paternal Grandmother   . Colon cancer Paternal Grandfather   . Stroke Neg Hx      Current Outpatient Medications (Cardiovascular):  .  nitroGLYCERIN (NITROSTAT) 0.4 MG SL tablet, Place 1 tablet (0.4 mg total) under the tongue every 5 (five) minutes as needed for chest pain. Please keep upcoming appt in October. Thank you .  rosuvastatin (CRESTOR) 20 MG tablet, TAKE 1 TABLET BY MOUTH EVERY DAY  Current Outpatient Medications (Respiratory):  .  fluticasone (FLONASE) 50 MCG/ACT nasal spray, Place 2 sprays into both nostrils daily.  Current Outpatient Medications (Analgesics):  .  aspirin 81 MG tablet, Take 81 mg by mouth daily. Marland Kitchen  oxyCODONE (OXY IR/ROXICODONE) 5 MG immediate release tablet, Take 1 tablet (5 mg total) by mouth every 8 (eight) hours as needed for severe pain. Must last 30 days. Derrill Memo ON 11/02/2019] oxyCODONE (OXY IR/ROXICODONE) 5 MG immediate release tablet, Take 1 tablet (5 mg total) by mouth every 8 (eight) hours as needed for severe pain. Must last 30 days. Marland Kitchen  oxyCODONE (OXY IR/ROXICODONE) 5 MG immediate release tablet, Take 1 tablet (5 mg total) by mouth every 8 (eight) hours as needed for severe pain. Must last 30 days.   Current Outpatient Medications (Other):  Marland Kitchen  ACCU-CHEK AVIVA PLUS test strip, USE TO CHECK BLOOD SUGAR ONE TIME A DAY .  Accu-Chek Softclix Lancets lancets, USE TO CHECK BLOOD SUGAR 1 TIME DAILY .   AMBULATORY NON FORMULARY MEDICATION, Medication Name: CPAP MASK OF CHOICE FOR HOME DEVICE .  buPROPion (WELLBUTRIN XL) 150 MG 24 hr tablet, TAKE 1 TABLET BY MOUTH EVERY DAY .  Cinnamon 500 MG TABS, Take 1,000 mg by mouth daily. .  Coenzyme Q10 (CO Q 10) 100 MG CAPS, Take 100 mg by mouth daily.  Marland Kitchen  erythromycin ophthalmic ointment, 1 application at bedtime. Marland Kitchen  gentamicin ointment (GARAMYCIN) 0.1 %, APPLY TO NOSE THREE TIMES DAILY .  glucosamine-chondroitin 500-400 MG tablet, Take 2 tablets by mouth daily.  .  Magnesium 250 MG TABS, Take 250 mg by mouth daily. .  Misc Natural Products (TART CHERRY ADVANCED PO), Take 1,200 mg by mouth at bedtime.  .  Multiple Vitamin (MULTIVITAMIN) tablet, Take 1 tablet by mouth daily. .  naloxone (NARCAN) 2 MG/2ML injection, Inject 1 mL (1 mg total) into the muscle as needed for up to 2 doses (for opioid overdose). Inject content of syringe into thigh muscle. Call 911. .  NONFORMULARY OR COMPOUNDED ITEM, 212.8 mcg by Epidural Infusion route. Medtronic Neuromodulation pump Fentanyl 212.33mcg/day via IT pump, Baclofen, bupivicaine .  NONFORMULARY OR COMPOUNDED ITEM, 88.77 mcg by Epidural Infusion route Continuous EPIDURAL. Medtronic Neuromodulation pump: Fentanyl, baclofen 88.77, bupivicane .  NONFORMULARY OR COMPOUNDED ITEM, 5.918 mg by Epidural Infusion route Continuous EPIDURAL. Medtronic Neuromodulation pump: Fentanyl, Baclofen, Bupivicaine 5.918 mg .  Omega-3 Fatty Acids (FISH OIL) 1000 MG CAPS, Take 2,000 mg by mouth 2 (two) times daily.  Marland Kitchen  PAIN MANAGEMENT IT PUMP REFILL, 1 each by Intrathecal route once for 1 dose. Medication: PF Fentanyl 1,000.0 mcg/ml PF Baclofen 240.0 mcg/ml PF Bupivacaine 20.0 mg/ml Total Volume: 40.0 ml Needed by 11-26-2019 @ 1000 .  PARoxetine (PAXIL) 20 MG tablet, TAKE ONE-HALF OF A TABLET BY MOUTH DAILY IN THE MORNING .  polyethylene glycol (MIRALAX / GLYCOLAX) packet, Take 17 g by mouth daily.  .  pregabalin (LYRICA) 150 MG capsule, Take  1 capsule (150 mg total) by mouth 3 (three)  times daily. Derrill Memo ON 11/02/2019] pregabalin (LYRICA) 150 MG capsule, Take 1 capsule (150 mg total) by mouth 3 (three) times daily. Marland Kitchen  tiZANidine (ZANAFLEX) 4 MG tablet, Take 1 tablet (4 mg total) by mouth 3 (three) times daily. Derrill Memo ON 11/02/2019] tiZANidine (ZANAFLEX) 4 MG tablet, Take 1 tablet (4 mg total) by mouth 3 (three) times daily. Marland Kitchen  VITAMIN E PO, Take 450 Units by mouth daily.  .  cephALEXin (KEFLEX) 500 MG capsule, Take 500 mg by mouth 4 (four) times daily. .  Turmeric 500 MG TABS, Take 500 mg by mouth 2 (two) times daily.    Reviewed prior external information including notes and imaging from  primary care provider As well as notes that were available from care everywhere and other healthcare systems.  Past medical history, social, surgical and family history all reviewed in electronic medical record.  No pertanent information unless stated regarding to the chief complaint.   Review of Systems:  No headache, visual changes, nausea, vomiting, diarrhea, constipation, dizziness, abdominal pain, skin rash, fevers, chills, night sweats, weight loss, swollen lymph nodes, body aches, joint swelling, chest pain, shortness of breath, mood changes. POSITIVE muscle aches  Objective  Blood pressure 124/78, pulse 82, height 5\' 10"  (1.778 m), weight 213 lb (96.6 kg), SpO2 98 %.   General: No apparent distress alert and oriented x3 mood and affect normal, dressed appropriately.  HEENT: Pupils equal, extraocular movements intact  Respiratory: Patient's speak in full sentences and does not appear short of breath  Cardiovascular: No lower extremity edema, non tender, no erythema  Neuro: Cranial nerves II through XII are intact, neurovascularly intact in all extremities with 2+ DTRs and 2+ pulses.  Severely antalgic Right knee does have significant swelling.  Arthritic changes noted.  Abnormal thigh to calf ratio.  Patient does have limited range  of motion going 95 degrees of flexion  Procedure: Real-time Ultrasound Guided Injection of right knee Device: GE Logiq Q7 Ultrasound guided injection is preferred based studies that show increased duration, increased effect, greater accuracy, decreased procedural pain, increased response rate, and decreased cost with ultrasound guided versus blind injection.  Verbal informed consent obtained.  Time-out conducted.  Noted no overlying erythema, induration, or other signs of local infection.  Skin prepped in a sterile fashion.  Local anesthesia: Topical Ethyl chloride.  With sterile technique and under real time ultrasound guidance: With a 22-gauge 2 inch needle patient was injected with 4 cc of 0.5% Marcaine and aspirated 70 cc of straw-colored fluid then injected 1 cc of Kenalog 40 mg/dL. This was from a superior lateral approach.  Completed without difficulty  Pain immediately resolved suggesting accurate placement of the medication.  Advised to call if fevers/chills, erythema, induration, drainage, or persistent bleeding.  Images permanently stored and available for review in the ultrasound unit.  Impression: Technically successful ultrasound guided injection.    Impression and Recommendations:     This case required medical decision making of moderate complexity. The above documentation has been reviewed and is accurate and complete Lyndal Pulley, DO       Note: This dictation was prepared with Dragon dictation along with smaller phrase technology. Any transcriptional errors that result from this process are unintentional.

## 2019-10-28 NOTE — Assessment & Plan Note (Signed)
Chronic problem with exacerbation.  Significant swelling of the right knee only.  Patient did have aspiration injection today.  Tolerated the procedure well.  Hopefully patient will continue to make improvement.  Follow-up with me again in 4 to 6 weeks.  Could be a candidate for viscosupplementation.  Has been somewhat noncompliant with wearing the brace and has multiple other comorbidities that make surgical intervention with difficulty in social determinants of health

## 2019-10-30 ENCOUNTER — Other Ambulatory Visit (HOSPITAL_COMMUNITY): Payer: Self-pay | Admitting: Neurology

## 2019-10-30 DIAGNOSIS — Z79891 Long term (current) use of opiate analgesic: Secondary | ICD-10-CM

## 2019-10-30 DIAGNOSIS — M5416 Radiculopathy, lumbar region: Secondary | ICD-10-CM

## 2019-10-30 DIAGNOSIS — R29898 Other symptoms and signs involving the musculoskeletal system: Secondary | ICD-10-CM

## 2019-11-12 DIAGNOSIS — E1142 Type 2 diabetes mellitus with diabetic polyneuropathy: Secondary | ICD-10-CM | POA: Diagnosis not present

## 2019-11-12 DIAGNOSIS — Z89421 Acquired absence of other right toe(s): Secondary | ICD-10-CM | POA: Diagnosis not present

## 2019-11-12 DIAGNOSIS — L559 Sunburn, unspecified: Secondary | ICD-10-CM | POA: Diagnosis not present

## 2019-11-16 ENCOUNTER — Other Ambulatory Visit: Payer: Self-pay | Admitting: *Deleted

## 2019-11-16 NOTE — Patient Outreach (Signed)
Member screened for potential [facility] needs as a benefit of ACO.  Per Patient Pearletha Forge member has had multiple ED visits/hospitalizations. However, last ED visit January 2021.   Telephone call made to member [phone] to discuss [facility] needs. However, no answer. Left HIPAA compliant voicemail message to request return call.     Marthenia Rolling, MSN-Ed, RN,BSN [facility] Acute Care Coordinator [phone] [facility]) [phone]  (Toll free office)

## 2019-11-18 ENCOUNTER — Ambulatory Visit
Admission: RE | Admit: 2019-11-18 | Discharge: 2019-11-18 | Disposition: A | Discharge status: Home / Self Care | Payer: Medicare Other | Source: Ambulatory Visit | Attending: Neurology | Admitting: Neurology

## 2019-11-18 ENCOUNTER — Encounter: Payer: Self-pay | Admitting: Pain Medicine

## 2019-11-18 ENCOUNTER — Other Ambulatory Visit: Payer: Self-pay

## 2019-11-18 ENCOUNTER — Encounter: Payer: Self-pay | Admitting: *Deleted

## 2019-11-18 DIAGNOSIS — M5416 Radiculopathy, lumbar region: Secondary | ICD-10-CM | POA: Insufficient documentation

## 2019-11-18 DIAGNOSIS — Z79891 Long term (current) use of opiate analgesic: Secondary | ICD-10-CM | POA: Diagnosis not present

## 2019-11-18 DIAGNOSIS — M545 Low back pain: Secondary | ICD-10-CM | POA: Diagnosis not present

## 2019-11-18 DIAGNOSIS — R29898 Other symptoms and signs involving the musculoskeletal system: Secondary | ICD-10-CM | POA: Diagnosis not present

## 2019-11-19 ENCOUNTER — Ambulatory Visit
Admission: RE | Admit: 2019-11-19 | Discharge: 2019-11-19 | Disposition: A | Discharge status: Home / Self Care | Payer: Medicare Other | Source: Ambulatory Visit | Attending: Neurosurgery | Admitting: Neurosurgery

## 2019-11-19 ENCOUNTER — Other Ambulatory Visit: Payer: Self-pay | Admitting: Neurosurgery

## 2019-11-19 ENCOUNTER — Other Ambulatory Visit: Payer: Self-pay | Admitting: Neurology

## 2019-11-19 DIAGNOSIS — M546 Pain in thoracic spine: Secondary | ICD-10-CM | POA: Diagnosis not present

## 2019-11-19 DIAGNOSIS — M419 Scoliosis, unspecified: Secondary | ICD-10-CM | POA: Diagnosis not present

## 2019-11-19 DIAGNOSIS — R29898 Other symptoms and signs involving the musculoskeletal system: Secondary | ICD-10-CM

## 2019-11-19 DIAGNOSIS — M5416 Radiculopathy, lumbar region: Secondary | ICD-10-CM

## 2019-11-19 DIAGNOSIS — M47816 Spondylosis without myelopathy or radiculopathy, lumbar region: Secondary | ICD-10-CM | POA: Diagnosis not present

## 2019-11-19 DIAGNOSIS — Z79891 Long term (current) use of opiate analgesic: Secondary | ICD-10-CM

## 2019-11-19 DIAGNOSIS — G8929 Other chronic pain: Secondary | ICD-10-CM | POA: Diagnosis not present

## 2019-11-23 DIAGNOSIS — T888XXD Other specified complications of surgical and medical care, not elsewhere classified, subsequent encounter: Secondary | ICD-10-CM | POA: Diagnosis not present

## 2019-11-26 ENCOUNTER — Ambulatory Visit
Admission: RE | Admit: 2019-11-26 | Discharge: 2019-11-26 | Disposition: A | Discharge status: Home / Self Care | Payer: Medicare Other | Source: Ambulatory Visit | Attending: Pain Medicine | Admitting: Pain Medicine

## 2019-11-26 ENCOUNTER — Telehealth: Payer: Self-pay

## 2019-11-26 ENCOUNTER — Encounter: Payer: Self-pay | Admitting: Pain Medicine

## 2019-11-26 ENCOUNTER — Other Ambulatory Visit: Payer: Self-pay

## 2019-11-26 ENCOUNTER — Ambulatory Visit: Payer: Medicare Other | Attending: Pain Medicine | Admitting: Pain Medicine

## 2019-11-26 VITALS — BP 131/80 | HR 82 | Temp 97.7°F | Ht 70.0 in | Wt 220.0 lb

## 2019-11-26 DIAGNOSIS — M5441 Lumbago with sciatica, right side: Secondary | ICD-10-CM | POA: Diagnosis not present

## 2019-11-26 DIAGNOSIS — Z978 Presence of other specified devices: Secondary | ICD-10-CM | POA: Diagnosis not present

## 2019-11-26 DIAGNOSIS — M549 Dorsalgia, unspecified: Secondary | ICD-10-CM | POA: Insufficient documentation

## 2019-11-26 DIAGNOSIS — M5137 Other intervertebral disc degeneration, lumbosacral region: Secondary | ICD-10-CM | POA: Diagnosis not present

## 2019-11-26 DIAGNOSIS — Z7984 Long term (current) use of oral hypoglycemic drugs: Secondary | ICD-10-CM | POA: Insufficient documentation

## 2019-11-26 DIAGNOSIS — Z79899 Other long term (current) drug therapy: Secondary | ICD-10-CM | POA: Diagnosis not present

## 2019-11-26 DIAGNOSIS — G894 Chronic pain syndrome: Secondary | ICD-10-CM | POA: Insufficient documentation

## 2019-11-26 DIAGNOSIS — M961 Postlaminectomy syndrome, not elsewhere classified: Secondary | ICD-10-CM

## 2019-11-26 DIAGNOSIS — Z969 Presence of functional implant, unspecified: Secondary | ICD-10-CM | POA: Diagnosis not present

## 2019-11-26 DIAGNOSIS — Z451 Encounter for adjustment and management of infusion pump: Secondary | ICD-10-CM

## 2019-11-26 DIAGNOSIS — G8929 Other chronic pain: Secondary | ICD-10-CM

## 2019-11-26 DIAGNOSIS — Z7982 Long term (current) use of aspirin: Secondary | ICD-10-CM | POA: Diagnosis not present

## 2019-11-26 MED ORDER — OXYCODONE HCL 5 MG PO TABS: 5.0000 mg | ORAL_TABLET | Freq: Three times a day (TID) | ORAL | 0 refills | Status: DC | PRN

## 2019-11-26 NOTE — Progress Notes (Signed)
PROVIDER NOTE: Information contained herein reflects review and annotations entered in association with encounter. Interpretation of such information and data should be left to medically-trained personnel. Information provided to patient can be located elsewhere in the medical record under "Patient Instructions". Document created using STT-dictation technology, any transcriptional errors that may result from process are unintentional.    Patient: Melinda [name]  Service Category: Procedure  Provider: Gaspar Cola, MD  DOB: 26-Apr-1956  DOS: 11/26/2019  Location: [facility]  MRN: [id number]  Setting: Ambulatory - outpatient  Referring Provider: Jearld Fenton, NP  Type: Established Patient  Specialty: Interventional Pain Management  PCP: Jearld Fenton, NP   Primary Reason for Visit: Interventional Pain Management Treatment. CC: Back Pain  Procedure:          Intrathecal Drug Delivery System (IDDS):  Type: Reservoir Refill [phone]) 20% rate increase Region: Abdominal Laterality: Right   NOTE: Pump up-side down.  Type of Pump: Medtronic Synchromed II Delivery Route: Intrathecal Type of Pain Treated: Neuropathic/Nociceptive Primary Medication Class: Opioid/opiate  Medication, Concentration, Infusion Program, & Delivery Rate: Please see scanned programming printout.   Indications: 1. Chronic pain syndrome   2. Failed back surgical syndrome   3. Failed cervical surgery syndrome (ACDF)   4. DDD (degenerative disc disease), lumbosacral   5. Chronic low back pain (Bilateral) (L>R) w/ sciatica (Right)   6. Presence of functional implant (Medtronic programmable intrathecal pump) (Right abdominal area)   7. 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 Encounter for adjustment or management of infusion pump    Pain Assessment: Self-Reported Pain Score: 4 /10             Reported level is compatible with observation.        Note:  Today the patient indicated having more pain than usual.  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  Today I went over the results of her prior diagnostic injections and I have reminded the patient that we can either do palliative injections or we can consider radiofrequency ablation of the lumbar facets, which in turn may provide her with longer lasting benefit.  At this point, she indicates having a lot of problems in her personal life where she is not able to take advantage of this radiofrequency ablation at this time.  I reminded the patient that that will continue to be an option whether or not we consider doing it now or later on.  To assist her with her current pain we will increase her pump by 20%.  Pharmacotherapy Assessment  Analgesic: Oxycodone 5 mg, 1 tab PO q 8 hrs (15 mg/day of oxycodone) + Intrathecal PF-Fentanyl MME/day: 22.5 mg/day (oral).   Monitoring: [place] PMP: PDMP reviewed during this encounter.       Pharmacotherapy: No side-effects or adverse reactions reported. Compliance: No problems identified. Effectiveness: Clinically acceptable. Plan: Refer to "POC".  UDS:  Summary  Date Value Ref Range Status  07/24/2018 FINAL  Final    Comment:    ==================================================================== TOXASSURE SELECT 13 (MW) ==================================================================== Test                             Result       Flag       Units Drug Present and Declared  for Prescription Verification   Oxycodone                      754          EXPECTED   ng/mg creat   Oxymorphone                    231          EXPECTED   ng/mg creat   Noroxycodone                   2463         EXPECTED   ng/mg creat   Noroxymorphone                 89           EXPECTED   ng/mg creat    Sources of oxycodone are scheduled prescription  medications.    Oxymorphone, noroxycodone, and noroxymorphone are expected    metabolites of oxycodone. Oxymorphone is also available as a    scheduled prescription medication. Drug Present not Declared for Prescription Verification   Fentanyl                       4            UNEXPECTED ng/mg creat   Norfentanyl                    29           UNEXPECTED ng/mg creat    Source of fentanyl is a scheduled prescription medication,    including IV, patch, and transmucosal formulations. Norfentanyl    is an expected metabolite of fentanyl. ==================================================================== Test                      Result    Flag   Units      Ref Range   Creatinine              134              mg/dL      >=20 ==================================================================== Declared Medications:  The flagging and interpretation on this report are based on the  following declared medications.  Unexpected results may arise from  inaccuracies in the declared medications.  **Note: The testing scope of this panel includes these medications:  Oxycodone  **Note: The testing scope of this panel does not include following  reported medications:  Aspirin (Aspirin 81)  Bupropion (Wellbutrin)  Chondroitin (Glucosamine-Chondroitin)  Cinnamon  Glipizide (Glucotrol)  Glucosamine (Glucosamine-Chondroitin)  Magnesium  Multivitamin  Naloxone  Nitroglycerin (Nitrostat)  Paroxetine  Polyethylene Glycol (GlycoLAX)  Polyethylene Glycol (MiraLAX)  Pregabalin (Lyrica)  Rosuvastatin (Crestor)  Supplement (Omega-3)  Tizanidine (Zanaflex)  Turmeric  Ubiquinone (Coenzyme Q 10)  Vitamin D2 (Drisdol)  Vitamin E ==================================================================== For clinical consultation, please call [phone]. ====================================================================    Intrathecal Pump Therapy Assessment  Manufacturer: Medtronic  Synchromed Type: Programmable Volume: 40 mL reservoir MRI compatibility: Yes   Drug content:  Primary Medication Class:Opioid Primary Medication:PF-Fentanyl(1073mg/mL) Secondary Medication:PF-Bupivacaine(222mmL) Other Medication:PF-Baclofen (24023mmL)   Programming:  Type: Simple continuous. See pump readout for details.   Changes:  Medication Change: None at this point Rate Change: No change in rate  Reported side-effects or adverse reactions: None reported  Effectiveness: Described as relatively effective, allowing for increase in activities of daily living (ADL) Clinically meaningful improvement in function (CMIF): Sustained  CMIF goals met  Plan: Pump refill today  Pre-op Assessment:  Ms. Belcourt is a 64 y.o. (year old), female patient, seen today for interventional treatment. 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 Ms. Stradley has a current medication list which includes the following prescription(s): accu-chek aviva plus, accu-chek softclix lancets, AMBULATORY NON FORMULARY MEDICATION, aspirin, bupropion, cinnamon, co q 10, erythromycin, fluticasone, gentamicin ointment, glucosamine-chondroitin, magnesium, misc natural products, multivitamin, naloxone, nitroglycerin,  NONFORMULARY OR COMPOUNDED ITEM, NONFORMULARY OR COMPOUNDED ITEM, NONFORMULARY OR COMPOUNDED ITEM, fish oil, paroxetine, polyethylene glycol, pregabalin, rosuvastatin, tizanidine, vitamin e, [START ON 12/02/2019] oxycodone, [START ON 01/01/2020] oxycodone, and [START ON 01/31/2020] oxycodone. Her primarily concern today is the Back Pain  Initial Vital Signs:  Pulse/HCG Rate: 82  Temp: 97.7 F (36.5 C) Resp:   BP: 131/80 SpO2: 99 %  BMI: Estimated body mass index is 31.57 kg/m as calculated from the following:   Height as of this encounter: _0  (1.778 m).   Weight as of this encounter: 220 lb (99.8 kg).  Risk Assessment: Allergies: Reviewed. She has No Known Allergies.  Allergy Precautions: None required Coagulopathies: Reviewed. None identified.  Blood-thinner therapy: None at this time Active Infection(s): Reviewed. None identified. Ms. Moehring is afebrile  Site Confirmation: Ms. Virden was asked to confirm the procedure and laterality before marking the site Procedure checklist: Completed Consent: Before the procedure and under the influence of no sedative(s), amnesic(s), or anxiolytics, the patient was informed of the treatment options, risks and possible complications. To fulfill our ethical and legal obligations, as recommended by the American Medical Association's Code of Ethics, I have informed the patient of my clinical impression; the nature and purpose of the treatment or procedure; the risks, benefits, and possible complications of the intervention; the alternatives, including doing nothing; the risk(s) and benefit(s) of the alternative treatment(s) or procedure(s); and the risk(s) and benefit(s) of doing nothing.  Ms. Kasler was provided with information about the general risks and possible complications associated with most interventional procedures. These include, but are not limited to: failure to achieve desired goals, infection, bleeding, organ or nerve damage, allergic  reactions, paralysis, and/or death.  In addition, she was informed of those risks and possible complications associated to this particular procedure, which include, but are not limited to: damage to the implant; failure to decrease pain; local, systemic, or serious CNS infections, intraspinal abscess with possible cord compression and paralysis, or life-threatening such as meningitis; bleeding; organ damage; nerve injury or damage with subsequent sensory, motor, and/or autonomic system dysfunction, resulting in transient or permanent pain, numbness, and/or weakness of one or several areas of the body; allergic reactions, either minor or major life-threatening, such as anaphylactic or anaphylactoid reactions.  Furthermore, Ms. Ranker was informed of those risks and complications associated with the medications. These include, but are not limited to: allergic reactions (i.e.: anaphylactic or anaphylactoid reactions); endorphine suppression; bradycardia and/or hypotension; water retention and/or peripheral vascular relaxation leading to lower extremity edema and possible stasis ulcers; respiratory depression  and/or shortness of breath; decreased metabolic rate leading to weight gain; swelling or edema; medication-induced neural toxicity; particulate matter embolism and blood vessel occlusion with resultant organ, and/or nervous system infarction; and/or intrathecal granuloma formation with possible spinal cord compression and permanent paralysis.  Before refilling the pump Ms. Pensabene was informed that some of the medications used in the devise may not be FDA approved for such use and therefore it constitutes an off-label use of the medications.  Finally, she was informed that Medicine is not an exact science; therefore, there is also the possibility of unforeseen or unpredictable risks and/or possible complications that may result in a catastrophic outcome. The patient indicated having understood very  clearly. We have given the patient no guarantees and we have made no promises. Enough time was given to the patient to ask questions, all of which were answered to the patient's satisfaction. Ms. Poynor has indicated that she wanted to continue with the procedure. Attestation: I, the ordering provider, attest that I have discussed with the patient the benefits, risks, side-effects, alternatives, likelihood of achieving goals, and potential problems during recovery for the procedure that I have provided informed consent. Date  Time: 11/26/2019 11:30 AM  Pre-Procedure Preparation:  Monitoring: As per clinic protocol. Respiration, ETCO2, SpO2, BP, heart rate and rhythm monitor placed and checked for adequate function Safety Precautions: Patient was assessed for positional comfort and pressure points before starting the procedure. Time-out: I initiated and conducted the "Time-out" before starting the procedure, as per protocol. The patient was asked to participate by confirming the accuracy of the "Time Out" information. Verification of the correct person, site, and procedure were performed and confirmed by me, the nursing staff, and the patient. "Time-out" conducted as per Joint Commission's Universal Protocol (UP.01.01.01). Time: 1140  Description of Procedure:          Position: Supine Target Area: Central-port of intrathecal pump. Approach: Anterior, 90 degree angle approach. Area Prepped: Entire Area around the pump implant. DuraPrep (Iodine Povacrylex [0.7% available iodine] and Isopropyl Alcohol, 74% w/w) Safety Precautions: Aspiration looking for blood return was conducted prior to all injections. At no point did we inject any substances, as a needle was being advanced. No attempts were made at seeking any paresthesias. Safe injection practices and needle disposal techniques used. Medications properly checked for expiration dates. SDV (single dose vial) medications used. Description of the  Procedure: Protocol guidelines were followed. Today, 2 nurses attempted to access the pump, unsuccessfully.  I was called into the room to assess the situation and I was also unable to access the pump.  Furthermore, as I went in I cannot feel the center port and due to the fact that the patient had indicated that the pump felt like it was moving, I became highly suspicious of the possibility that the pump at flipped and the upside portion of the pump was up.  Therefore, I took the patient to the fluoroscopy suite where we took a look at the pump and it was immediately obvious that it had flipped.  The port was looking down.  I went ahead and flipped the pump back into normal position and after that it was easy to access the central port using fluoroscopy.  We emptied the pump and introduced the new medication without any problems.  X-rays of the entire process were saved for documentation purposes. The pump was interrogated and programmed to reflect the correct medication, volume, and dosage. The program was printed and taken to the physician  for approval. Once checked and signed by the physician, a copy was provided to the patient and another scanned into the EMR. Vitals:   11/26/19 1126  BP: 131/80  Pulse: 82  Temp: 97.7 F (36.5 C)  SpO2: 99%  Weight: 220 lb (99.8 kg)  Height: _0  (1.778 m)    Start Time: 1146 hrs. End Time: 1300(patient had to be taken in for flouro, pump had flipped, [name] manipulated pump under flouro and we were successfully able to fill.) hrs. Materials & Medications: Medtronic Refill Kit Medication(s): Please see chart orders for details.  Imaging Guidance:          Type of Imaging Technique: Fluoroscopy Guidance (Non-spinal) Indication(s): Assistance in diagnosing abnormal movement of the implant.  Assistance in manipulating the implant back into its correct position.  And accessing the implant while trying to stay away from the intrathecal catheter. Exposure  Time: Please see nurses notes. Contrast: None used. Fluoroscopic Guidance: I was personally present during the use of fluoroscopy. "Tunnel Vision Technique" used to obtain the best possible view of the target area. Parallax error corrected before commencing the procedure. "Direction-depth-direction" technique used to introduce the needle under continuous pulsed fluoroscopy. Once target was reached, antero-posterior, oblique, and lateral fluoroscopic projection used confirm needle placement in all planes. Images permanently stored in EMR. Ultrasound Guidance: N/A Interpretation: No contrast injected. I personally interpreted the imaging intraoperatively. Adequate needle placement confirmed in multiple planes. Permanent images saved into the patient's record.   Antibiotic Prophylaxis:   Anti-infectives (From admission, onward)   None     Indication(s): None identified  Post-operative Assessment:  Post-procedure Vital Signs:  Pulse/HCG Rate: 82  Temp: 97.7 F (36.5 C) Resp:   BP: 131/80 SpO2: 99 %  EBL: None  Complications: No immediate post-treatment complications observed by team, or reported by patient.  Note: The patient tolerated the entire procedure well. A repeat set of vitals were taken after the procedure and the patient was kept under observation following institutional policy, for this type of procedure. Post-procedural neurological assessment was performed, showing return to baseline, prior to discharge. The patient was provided with post-procedure discharge instructions, including a section on how to identify potential problems. Should any problems arise concerning this procedure, the patient was given instructions to immediately contact us, at any time, without hesitation. In any case, we plan to contact the patient by telephone for a follow-up status report regarding this interventional procedure.  Comments:  No additional relevant information.  Plan of Care  Orders:   Orders Placed This Encounter  Procedures  . PUMP REFILL    Maintain Protocol by having two(2) healthcare providers during procedure and programming.    Scheduling Instructions:     Please refill intrathecal pump today.    Order Specific Question:   Where will this procedure be performed?    Answer:   [facility]  . PUMP REFILL    Whenever possible schedule on a procedure today.    Standing Status:   Future    Standing Expiration Date:   04/24/2020    Scheduling Instructions:     Please schedule intrathecal pump refill based on pump programming. Avoid schedule intervals of more than 120 days (4 months).    Order Specific Question:   Where will this procedure be performed?    Answer:   [facility]  . DG PAIN CLINIC C-ARM 1-60 MIN NO REPORT    Intraoperative interpretation by procedural physician at [facility].    Standing Status:   Standing    Number of Occurrences:   1    Order Specific Question:   Reason for exam:    Answer:   Assistance in needle guidance and placement for procedures requiring needle placement in or near specific anatomical locations not easily accessible without such assistance.  . Informed Consent Details: Physician/Practitioner Attestation; Transcribe to consent form and obtain patient signature    Provider Attestation: I, [place] Dossie Arbour, MD, (Pain Management Specialist), the physician/practitioner, attest that I have discussed with the patient the benefits, risks, side effects, alternatives, likelihood of achieving goals and potential problems during recovery for the procedure that I have provided informed consent.    Scheduling Instructions:     Procedure: Intrathecal Pump Refill     Attending Physician: Beatriz Chancellor A. Dossie Arbour, MD     Indications: Chronic Pain Syndrome (G89.4)     Transcribe to consent form and obtain patient signature.   Chronic Opioid Analgesic:  Oxycodone 5 mg, 1 tab PO q 8 hrs (15 mg/day of oxycodone) +  Intrathecal PF-Fentanyl MME/day: 22.5 mg/day (oral).   Medications ordered for procedure: Meds ordered this encounter  Medications  . oxyCODONE (OXY IR/ROXICODONE) 5 MG immediate release tablet    Sig: Take 1 tablet (5 mg total) by mouth every 8 (eight) hours as needed for severe pain. Must last 30 days.    Dispense:  90 tablet    Refill:  0    Chronic Pain: STOP Act (Not applicable) Fill 1 day early if closed on refill date. Do not fill until: 12/02/2019. To last until: 01/01/2020. Avoid benzodiazepines within 8 hours of opioids  . oxyCODONE (OXY IR/ROXICODONE) 5 MG immediate release tablet    Sig: Take 1 tablet (5 mg total) by mouth every 8 (eight) hours as needed for severe pain. Must last 30 days.    Dispense:  90 tablet    Refill:  0    Chronic Pain: STOP Act (Not applicable) Fill 1 day early if closed on refill date. Do not fill until: 01/01/2020. To last until: 01/31/2020. Avoid benzodiazepines within 8 hours of opioids  . oxyCODONE (OXY IR/ROXICODONE) 5 MG immediate release tablet    Sig: Take 1 tablet (5 mg total) by mouth every 8 (eight) hours as needed for severe pain. Must last 30 days.    Dispense:  90 tablet    Refill:  0    Chronic Pain: STOP Act (Not applicable) Fill 1 day early if closed on refill date. Do not fill until: 01/31/2020. To last until: 03/01/2020. Avoid benzodiazepines within 8 hours of opioids   Medications administered: Melinda [name] had no medications administered during this visit.  See the medical record for exact dosing, route, and time of administration.  Follow-up plan:   Return for Pump Refill (Max:26mo.       Interventional treatment options:  Under consideration: Possible left lumbar facet RFA  Possible right lumbar facet RFA  Diagnostic caudal ESI + diagnostic epidurogram Possible Racz procedure Diagnostic bilateral IA knee injection (Steroid) Diagnostic bilateral genicular NB Possible bilateral genicular nerve RFA Possible bilateral Hyalgan  knee injection DiagnosticcervicalESI Diagnostic bilateral cervical facet block Possible bilateral cervical facet RFA   Therapeutic/palliative (PRN): Palliative/therapeutic intrathecal pump management (refills/programming adjustments)  Palliative left L2-3 LESI #2 Palliative right lumbar facet block #4  Diagnostic left lumbar facet block #2      Recent Visits No visits were found meeting these conditions.  Showing recent visits within past 90  days and meeting all other requirements   Today's Visits Date Type Provider Dept  11/26/19 Procedure visit [name], MD [facility]  Showing today's visits and meeting all other requirements   Future Appointments Date Type Provider Dept  02/23/20 Appointment [name], MD [facility]  Showing future appointments within next 90 days and meeting all other requirements   Disposition: Discharge home  Discharge (Date  Time): 11/26/2019; 1330 hrs.   Primary Care Physician: Jearld Fenton, NP Location: [facility] Note by: Gaspar Cola, MD Date: 11/26/2019; Time: 3:30 PM  Disclaimer:  Medicine is not an Chief Strategy Officer. The only guarantee in medicine is that nothing is guaranteed. It is important to note that the decision to proceed with this intervention was based on the information collected from the patient. The Data and conclusions were drawn from the patient's questionnaire, the interview, and the physical examination. Because the information was provided in large part by the patient, it cannot be guaranteed that it has not been purposely or unconsciously manipulated. Every effort has been made to obtain as much relevant data as possible for this evaluation. It is important to note that the conclusions that lead to this procedure are derived in large part from the available data. Always take into account that the treatment will also be dependent on availability of  resources and existing treatment guidelines, considered by other Pain Management Practitioners as being common knowledge and practice, at the time of the intervention. For Medico-Legal purposes, it is also important to point out that variation in procedural techniques and pharmacological choices are the acceptable norm. The indications, contraindications, technique, and results of the above procedure should only be interpreted and judged by a Board-Certified Interventional Pain Specialist with extensive familiarity and expertise in the same exact procedure and technique.

## 2019-11-26 NOTE — Progress Notes (Signed)
Safety precautions to be maintained throughout the outpatient stay will include: orient to surroundings, keep bed in low position, maintain call bell within reach at all times, provide assistance with transfer out of bed and ambulation.  

## 2019-11-26 NOTE — Telephone Encounter (Signed)
Melinda [name] put in pump book.

## 2019-11-26 NOTE — Telephone Encounter (Signed)
I scheduled her pump refill for 02/23/20 at 11:15.

## 2019-11-26 NOTE — Progress Notes (Signed)
Nursing Pain Medication Assessment:  Safety precautions to be maintained throughout the outpatient stay will include: orient to surroundings, keep bed in low position, maintain call bell within reach at all times, provide assistance with transfer out of bed and ambulation.  Medication Inspection Compliance: Pill count conducted under aseptic conditions, in front of the patient. Neither the pills nor the bottle was removed from the patient's sight at any time. Once count was completed pills were immediately returned to the patient in their original bottle.  Medication: Oxycodone IR Pill/Patch Count: 55 of 90 pills remain Pill/Patch Appearance: Markings consistent with prescribed medication Bottle Appearance: Standard pharmacy container. Clearly labeled. Filled Date: 5 / 14 / 2021 Last Medication intake:  Today

## 2019-11-27 ENCOUNTER — Telehealth: Payer: Self-pay

## 2019-11-27 NOTE — Telephone Encounter (Signed)
Pt was called and no problems reported. 

## 2019-12-01 MED FILL — Medication: INTRATHECAL | Qty: 1 | Status: AC

## 2019-12-04 ENCOUNTER — Other Ambulatory Visit: Payer: Self-pay

## 2019-12-04 ENCOUNTER — Ambulatory Visit
Admission: RE | Admit: 2019-12-04 | Discharge: 2019-12-04 | Disposition: A | Discharge status: Home / Self Care | Payer: Medicare Other | Source: Ambulatory Visit | Attending: Neurology | Admitting: Neurology

## 2019-12-04 DIAGNOSIS — R29898 Other symptoms and signs involving the musculoskeletal system: Secondary | ICD-10-CM | POA: Diagnosis not present

## 2019-12-04 DIAGNOSIS — M546 Pain in thoracic spine: Secondary | ICD-10-CM | POA: Diagnosis not present

## 2019-12-04 DIAGNOSIS — M4804 Spinal stenosis, thoracic region: Secondary | ICD-10-CM | POA: Diagnosis not present

## 2019-12-04 DIAGNOSIS — M5416 Radiculopathy, lumbar region: Secondary | ICD-10-CM | POA: Diagnosis not present

## 2019-12-04 DIAGNOSIS — Z79891 Long term (current) use of opiate analgesic: Secondary | ICD-10-CM | POA: Diagnosis not present

## 2019-12-08 ENCOUNTER — Ambulatory Visit: Payer: Medicare Other | Admitting: Family Medicine

## 2019-12-09 ENCOUNTER — Encounter: Payer: Self-pay | Admitting: Pain Medicine

## 2019-12-09 ENCOUNTER — Ambulatory Visit: Payer: Medicare Other | Attending: Pain Medicine | Admitting: Pain Medicine

## 2019-12-09 ENCOUNTER — Other Ambulatory Visit: Payer: Self-pay

## 2019-12-09 VITALS — BP 116/79 | HR 88 | Temp 97.9°F | Resp 16 | Ht 70.0 in | Wt 215.0 lb

## 2019-12-09 DIAGNOSIS — M47894 Other spondylosis, thoracic region: Secondary | ICD-10-CM

## 2019-12-09 DIAGNOSIS — M47814 Spondylosis without myelopathy or radiculopathy, thoracic region: Secondary | ICD-10-CM | POA: Diagnosis not present

## 2019-12-09 DIAGNOSIS — G894 Chronic pain syndrome: Secondary | ICD-10-CM | POA: Diagnosis not present

## 2019-12-09 DIAGNOSIS — Z978 Presence of other specified devices: Secondary | ICD-10-CM

## 2019-12-09 DIAGNOSIS — Z451 Encounter for adjustment and management of infusion pump: Secondary | ICD-10-CM

## 2019-12-09 DIAGNOSIS — Z9689 Presence of other specified functional implants: Secondary | ICD-10-CM | POA: Diagnosis not present

## 2019-12-09 DIAGNOSIS — M961 Postlaminectomy syndrome, not elsewhere classified: Secondary | ICD-10-CM

## 2019-12-09 NOTE — Progress Notes (Signed)
PROVIDER NOTE: Information contained herein reflects review and annotations entered in association with encounter. Interpretation of such information and data should be left to medically-trained personnel. Information provided to patient can be located elsewhere in the medical record under "Patient Instructions". Document created using STT-dictation technology, any transcriptional errors that may result from process are unintentional.    Patient: Melinda [name]  Service Category: E/M  Provider: Gaspar Cola, MD  DOB: 06-09-1956  DOS: 12/09/2019  Specialty: Interventional Pain Management  MRN: [id number]  Setting: Ambulatory outpatient  PCP: Jearld Fenton, NP  Type: Established Patient    Referring Provider: Jearld Fenton, NP  Location: Office  Delivery: Face-to-face     HPI  Reason for encounter: Melinda [name], a 64 y.o. year old female, is here today for evaluation and management of her Chronic pain syndrome [G89.4]. Ms. Stradley primary complain today is Pain Last encounter: Practice (11/27/2019). My last encounter with her was on 11/26/2019. Pertinent problems: Ms. Sleeper has Chronic pain syndrome; Failed back surgical syndrome; Failed cervical surgery syndrome (ACDF); Chronic neck pain; Chronic low back pain (Bilateral) (L>R) w/ sciatica (Right); Epidural fibrosis; Cervical spondylosis; Neuropathic pain; Lumbar facet syndrome; Fibromyalgia; Chronic knee pain (Primary Area of Pain) (Right); Osteoarthritis of knee (Left); Osteoarthritis, multiple sites; Chronic musculoskeletal pain; Osteoarthritis of knee (Bilateral) (R>L); Hip pain, acute, left; Lumbar spondylosis; Degenerative arthritis of knee (Bilateral); Lumbar spinal stenosis w/ neurogenic claudication; Abnormal MRI, lumbar spine (04/01/2018); Lumbar facet hypertrophy (Multilevel); Lumbar foraminal stenosis; Lumbar lateral recess stenosis; DDD (degenerative disc disease), lumbar; Chronic right-sided low back pain w/o sciatica from IT  catheter anchor.; Spondylosis without myelopathy or radiculopathy, lumbosacral region; Cervical spondylosis; Chronic low back pain (Bilateral) w/o sciatica; DDD (degenerative disc disease), lumbosacral; Allodynia; Hyperalgesia; Thoracic facet hypertrophy/arthropathy (Multilevel) (Bilateral); and Thoracic facet syndrome (Multilevel) (Bilateral) on their pertinent problem list. Pain Assessment: Severity of Chronic pain is reported as a 5 /10. Location: Back Right, Left/towards both hips, worse on the left.. Onset: More than a month ago. Quality: Aching. Timing: Intermittent. Modifying factor(s): She feels that her pump is not working.. Vitals:  height is '5\' 10"'$  (1.778 m) and weight is 215 lb (97.5 kg). Her temporal temperature is 97.9 F (36.6 C). Her blood pressure is 116/79 and her pulse is 88. Her respiration is 16 and oxygen saturation is 96%.   The patient comes into the clinic today without a set appointment, complaining of increased pain and nonspecific symptoms compatible with withdrawal from opioid analgesics, after having had an MRI of the thoracic spine.  The patient's concern was whether or not there had been a problem with the pump during the MRI.  A read out of the pump was performed and it showed no emergency stops or discontinuation of therapy.  Just in case there was a temporary malfunction of the pump with abnormal delivery, today we went ahead and turned off the pump for approximately 1 minute and then restarted it.  There was no problems with the process of them the pump again gave [place] the same readout as before it was turned off.  The pump seems to be working adequately.  Since this occurred after an MRI of the thoracic spine, I took the opportunity to go back in and look at the results of that MRI and personally examined the images from the PACS system.  The intrathecal catheter can be seen in the intrathecal canal.  The official report from the radiologist indicates that the catheter is  intraspinal and "presumably  in the epidural space", which is not accurate since it is in the intrathecal space.  We have confirmed this in the past by obtaining free flow CSF from the side-port.  Since thoracic MRI confirmed the catheter to be in place, my conclusion is that the symptoms that she is experiencing are not related to a malfunction of the pump.  I have explained this to the patient in great detail in layman's terms.  She understood everything that I said and she indicated that she feels better that there is nothing wrong with the pump.  However, she still having some nonspecific symptoms and since they could be secondary to things like a viral infection, I have recommended that she follow-up with her primary care physician.  She indicated that she will do just that.  ROS  Constitutional: Denies any fever or chills Gastrointestinal: No reported hemesis, hematochezia, vomiting, or acute GI distress Musculoskeletal: Denies any acute onset joint swelling, redness, loss of ROM, or weakness Neurological: No reported episodes of acute onset apraxia, aphasia, dysarthria, agnosia, amnesia, paralysis, loss of coordination, or loss of consciousness  Medication Review  AMBULATORY NON FORMULARY MEDICATION, Accu-Chek Softclix Lancets, Cinnamon, Co Q 10, Fish Oil, Magnesium, Misc Natural Products, NONFORMULARY OR COMPOUNDED ITEM, PARoxetine, Vitamin E, aspirin, buPROPion, erythromycin, fluticasone, gentamicin ointment, glucosamine-chondroitin, glucose blood, multivitamin, naloxone, nitroGLYCERIN, oxyCODONE, polyethylene glycol, pregabalin, rosuvastatin, and tiZANidine  History Review  Allergy: Ms. Soltis has No Known Allergies. Drug: Ms. Soderholm  reports no history of drug use. Alcohol:  reports current alcohol use. Tobacco:  reports that she quit smoking about 12 years ago. Her smoking use included cigarettes. She has a 48.00 pack-year smoking history. She has never used smokeless tobacco. Social:  Ms. Henandez  reports that she quit smoking about 12 years ago. Her smoking use included cigarettes. She has a 48.00 pack-year smoking history. She has never used smokeless tobacco. She reports current alcohol use. She reports that she does not use drugs. 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[place]) (05/30/2010), Amputation of second toe, right, traumatic ([place] (09/2017), Anemia, Anxiety, Blue toes, Bulging disc, CAD (coronary artery disease) (2009), Cardiac arrhythmia due to congenital heart disease, Chicken pox, Chronic fatigue, Chronic kidney disease, Coronary arteritis, Degenerative disc disease, Depression, Diabetes mellitus without complication ([place]), Facet joint disease, Fibromyalgia, Heart disease, Hyperlipidemia, IBS (irritable bowel syndrome), MCL deficiency, knee, MRSA (methicillin resistant staph aureus) culture positive (2011), Neuropathy (04/18/2010), Peripheral neuropathy, Renal insufficiency, Restless leg syndrome, Sepsis ([place]) (09/2013), Sleep apnea (08/17/2003), and Spinal stenosis. Surgical: Ms. Catlin  has a past surgical history that includes Foot surgery (Right); Hand surgery (Left); Gallbladder surgery; Ablation; Ablation; HEART STENT (2009); HAND SURGEY (Left); Foot surgery (Bilateral); Foot surgery (Right); Infusion pump implantation; Back surgery; Cholecystectomy (2003); Anterior cervical decomp/discectomy fusion (N/A, 07/28/2014); Carpal tunnel release (Right); Eye surgery (Bilateral, 2013); Amputation toe (Right, 02/01/2017); Irrigation and debridement foot (Right, 02/23/2017); Toe Surgery; Hammer toe surgery (Right, 10/17/2017); Intrathecal pump revision (N/A, 04/25/2018); Intrathecal pump revision (Right, 04/25/2018); Coronary angioplasty (2008); Pain pump revision (N/A, 08/15/2018); and Intrathecal pump revision (N/A, 12/12/2018). Family: family history includes Colon cancer in her paternal grandfather; Diabetes in her father and paternal  grandmother; Heart disease in her maternal grandfather; Lung cancer in her mother.  Laboratory Chemistry Profile   Renal Lab Results  Component Value Date   BUN 24 (H) 03/12/2019   CREATININE 1.10 (H) 06/16/2019   GFR 51.49 (L) 03/12/2019   GFRAA >60 12/10/2018   GFRNONAA  58 (L) 12/10/2018     Hepatic Lab Results  Component Value Date   AST 20 03/12/2019   ALT 18 03/12/2019   ALBUMIN 4.3 03/12/2019   ALKPHOS 99 03/12/2019   HCVAB NEGATIVE 05/18/2016   LIPASE 11 10/04/2013     Electrolytes Lab Results  Component Value Date   NA 142 03/12/2019   K 4.4 03/12/2019   CL 104 03/12/2019   CALCIUM 9.0 03/12/2019   MG 2.1 06/30/2013     Bone Lab Results  Component Value Date   VD25OH 34.99 03/12/2019     Inflammation (CRP: Acute Phase) (ESR: Chronic Phase) Lab Results  Component Value Date   CRP 1 05/06/2018   ESRSEDRATE 20 05/06/2018   LATICACIDVEN 0.8 08/14/2017       Note: Above Lab results reviewed.  Recent Imaging Review  MR THORACIC SPINE WO CONTRAST CLINICAL DATA:  Back pain with lifting.  Baclofen pump.  EXAM: MRI THORACIC SPINE WITHOUT CONTRAST  TECHNIQUE: Multiplanar, multisequence MR imaging of the thoracic spine was performed. No intravenous contrast was administered.  COMPARISON:  Radiography 11/19/2019  FINDINGS: Alignment:  Mild thoracic curvature convex to the left.  Vertebrae: No fracture or primary bone lesion.  Cord:  No cord compression or primary cord lesion.  Paraspinal and other soft tissues: Negative  Disc levels:  Implantable device apparently enters at the T12-L1 interlaminar space in appears to be located within the dorsal epidural space with markers extending from T8-9 to T10-11. There is a small amount of fluid in the epidural space as might be expected considering infusion. This does not appear to have any mass effect upon the cord.  T1-2: No disc pathology. Minimal facet hypertrophy on the left.  No stenosis.  T2-3: No disc pathology. Mild bilateral facet degeneration, more on the left. No compressive stenosis.  T3-4: No disc abnormality.  Mild facet degeneration.  No stenosis.  T4-5: No disc pathology. Facet degeneration. No stenosis. Mild edema of the facets could be associated with back pain.  T5-6: No disc pathology. Bilateral facet degeneration right worse than left. No compressive canal stenosis. Right foraminal encroachment could affect the C5 nerve.  T6-7: No disc pathology. Bilateral facet degeneration and hypertrophy with mild bilateral foraminal narrowing, not likely compressive.  T7-8: No disc abnormality.  Mild facet degeneration.  No stenosis.  T8-9: No disc abnormality. Mild bilateral facet degeneration. No stenosis.  T9-10: Minimal bulging of the disc. Mild facet hypertrophy. No stenosis.  T10-11: Minimal bulging of the disc. Bilateral facet and ligamentous hypertrophy. Mild narrowing of the subarachnoid space surrounding the cord but no apparent compressive stenosis.  T11-12: Minimal bulging of the disc. Bilateral facet and ligamentous prominence. No compressive stenosis.  T12-L1: Normal interspace.  IMPRESSION: Infusion pump present in the dorsal aspect of the spinal canal, presumably the dorsal epidural space, with markers extending from T8-9 to T10-11. Small amount of dorsal epidural fluid as would be expected with infusion. No mass effect.  Facet osteoarthritis throughout the thoracic region as outlined above, which could be associated with back pain. Mild edema at the T4-5 level. Foraminal encroachment with some potential for neural compression on the right at T5-6. Mild canal stenosis at T10-11.  Electronically Signed   By: Nelson Chimes M.D.   On: 12/06/2019 09:23 Note: Reviewed        Physical Exam  General appearance: Well nourished, well developed, and well hydrated. In no apparent acute distress Mental status: Alert, oriented x  3 (person, place, & time)  Respiratory: No evidence of acute respiratory distress Eyes: PERLA Vitals: BP 116/79 (BP Location: Left Arm, Patient Position: Sitting, Cuff Size: Normal)   Pulse 88   Temp 97.9 F (36.6 C) (Temporal)   Resp 16   Ht '5\' 10"'$  (1.778 m)   Wt 215 lb (97.5 kg)   LMP  (LMP Unknown)   SpO2 96%   BMI 30.85 kg/m  BMI: Estimated body mass index is 30.85 kg/m as calculated from the following:   Height as of this encounter: '5\' 10"'$  (1.778 m).   Weight as of this encounter: 215 lb (97.5 kg). Ideal: Ideal body weight: 68.5 kg (151 lb 0.2 oz) Adjusted ideal body weight: 80.1 kg (176 lb 9.7 oz)  Assessment   Status Diagnosis  Controlled Controlled Controlled 1. Chronic pain syndrome   2. Failed back surgical syndrome   3. Thoracic facet hypertrophy/arthropathy (Multilevel) (Bilateral)   4. Thoracic facet syndrome (Multilevel) (Bilateral)   5. Presence of intrathecal pump ([employer]tronic intrathecal programmable pump) (40 mL pump)   6. Encounter for interrogation of infusion pump      Updated Problems: Problem  Thoracic facet hypertrophy/arthropathy (Multilevel) (Bilateral)  Thoracic facet syndrome (Multilevel) (Bilateral)  Encounter for Interrogation of Infusion Pump    Plan of Care  Problem-specific:  No problem-specific Assessment & Plan notes found for this encounter.  Ms. Kinzly Pierrelouis has a current medication list which includes the following long-term medication(s): bupropion, fluticasone, naloxone, nitroglycerin, oxycodone, [START ON 01/01/2020] oxycodone, [START ON 01/31/2020] oxycodone, paroxetine, pregabalin, rosuvastatin, and tizanidine.  Pharmacotherapy (Medications Ordered): No orders of the defined types were placed in this encounter.  Orders:  No orders of the defined types were placed in this encounter.  Follow-up plan:   Return for regular appointment.      Interventional treatment options:  Under consideration: Possible left lumbar  facet RFA  Possible right lumbar facet RFA  Diagnostic caudal ESI + diagnostic epidurogram Possible Racz procedure Diagnostic bilateral IA knee injection (Steroid) Diagnostic bilateral genicular NB Possible bilateral genicular nerve RFA Possible bilateral Hyalgan knee injection DiagnosticcervicalESI Diagnostic bilateral cervical facet block Possible bilateral cervical facet RFA   Therapeutic/palliative (PRN): Palliative/therapeutic intrathecal pump management (refills/programming adjustments)  Palliative left L2-3 LESI #2 Palliative right lumbar facet block #4  Diagnostic left lumbar facet block #2       Recent Visits Date Type Provider Dept  11/26/19 Procedure visit [name], MD [facility]  Showing recent visits within past 90 days and meeting all other requirements   Today's Visits Date Type Provider Dept  12/09/19 Office Visit [name], MD [facility]  Showing today's visits and meeting all other requirements   Future Appointments Date Type Provider Dept  02/23/20 Appointment [name], MD [facility]  Showing future appointments within next 90 days and meeting all other requirements   I discussed the assessment and treatment plan with the patient. The patient was provided an opportunity to ask questions and all were answered. The patient agreed with the plan and demonstrated an understanding of the instructions.  Patient advised to call back or seek an in-person evaluation if the symptoms or condition worsens.  Duration of encounter: 35 minutes.  Note by: Gaspar Cola, MD Date: 12/09/2019; Time: 2:22 PM

## 2019-12-10 DIAGNOSIS — M48061 Spinal stenosis, lumbar region without neurogenic claudication: Secondary | ICD-10-CM | POA: Diagnosis not present

## 2019-12-10 DIAGNOSIS — M546 Pain in thoracic spine: Secondary | ICD-10-CM | POA: Diagnosis not present

## 2019-12-10 DIAGNOSIS — G8929 Other chronic pain: Secondary | ICD-10-CM | POA: Diagnosis not present

## 2019-12-10 DIAGNOSIS — M419 Scoliosis, unspecified: Secondary | ICD-10-CM | POA: Diagnosis not present

## 2019-12-14 ENCOUNTER — Other Ambulatory Visit: Payer: Self-pay

## 2019-12-14 ENCOUNTER — Ambulatory Visit (INDEPENDENT_AMBULATORY_CARE_PROVIDER_SITE_OTHER): Payer: Medicare Other | Admitting: Family Medicine

## 2019-12-14 ENCOUNTER — Encounter: Payer: Self-pay | Admitting: Family Medicine

## 2019-12-14 VITALS — BP 112/60 | HR 83 | Ht 70.0 in | Wt 218.0 lb

## 2019-12-14 DIAGNOSIS — M25561 Pain in right knee: Secondary | ICD-10-CM | POA: Diagnosis not present

## 2019-12-14 DIAGNOSIS — M25562 Pain in left knee: Secondary | ICD-10-CM

## 2019-12-14 DIAGNOSIS — M170 Bilateral primary osteoarthritis of knee: Secondary | ICD-10-CM

## 2019-12-14 DIAGNOSIS — G8929 Other chronic pain: Secondary | ICD-10-CM

## 2019-12-14 NOTE — Patient Instructions (Signed)
I am here for any questions!

## 2019-12-14 NOTE — Progress Notes (Signed)
[place] Phone: [phone] Subjective:    I'm seeing this patient by the request  of:  Jearld Fenton, NP  CC: Knee pain follow-up  [id number]   10/28/2019 Chronic problem with exacerbation.  Significant swelling of the right knee only.  Patient did have aspiration injection today.  Tolerated the procedure well.  Hopefully patient will continue to make improvement.  Follow-up with me again in 4 to 6 weeks.  Could be a candidate for viscosupplementation.  Has been somewhat noncompliant with wearing the brace and has multiple other comorbidities that make surgical intervention with difficulty in social determinants of health  Update 12/14/2019 Cambrea Kirt is a 64 y.o. female coming in with complaint of bilateral knee pain. Had right knee drained last visit. Patient states her pain improved but her pain is coming back.   Had recent MRI lumbar and thoracic spine MRI. Wants to know if knee replacement on right side would help her back pain from getting worse.  Patient's MRI was independently visualized by me showing facet osteoarthritis throughout the thoracic region with edema at the T4-T5 level.  Patient's infusion pump been placed.     Past Medical History:  Diagnosis Date  . Amputation of great toe, right, traumatic ([place]) 05/30/2010  . Amputation of second toe, right, traumatic ([place]) 09/2017  . Anemia    years ago  . Anxiety   . Blue toes    2nd toe on right foot, will get appt.  . Bulging disc   . CAD (coronary artery disease) 2009   s/p stent to LAD  . 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  Ablations done.  Now has rare episodes  . Chicken pox   . Chronic fatigue   . Chronic kidney disease    "problem with kidney filtration"  . Coronary arteritis   . Degenerative disc disease   . Depression   . Diabetes mellitus without complication ([place])   . Facet joint disease   .  Fibromyalgia   . Heart disease   . Hyperlipidemia   . IBS (irritable bowel syndrome)   . MCL deficiency, knee   . MRSA (methicillin resistant staph aureus) culture positive 2011   GREAT TOE RIGHT FOOT  . Neuropathy 04/18/2010  . Peripheral neuropathy   . Renal insufficiency   . Restless leg syndrome   . Sepsis ([place]) 09/2013  . Sleep apnea 08/17/2003   uses CPAP, sleep study at [facility] (mild to moderate)  . Spinal stenosis    Past Surgical History:  Procedure Laterality Date  . ABLATION     UTERUS  . ABLATION     HEART  . AMPUTATION TOE Right 02/01/2017   Procedure: AMPUTATION TOE-RIGHT 2ND MPJ;  Surgeon: Albertine Patricia, DPM;  Location: [facility];  Service: Podiatry;  Laterality: Right;  . ANTERIOR CERVICAL DECOMP/DISCECTOMY FUSION N/A 07/28/2014   Procedure: ANTERIOR CERVICAL DECOMPRESSION/DISCECTOMY FUSION CERVICAL 3-4,4-5,5-6 LEVELS WITH INSTRUMENTATION AND ALLOGRAFT;  Surgeon: Sinclair Ship, MD;  Location: [place];  Service: Orthopedics;  Laterality: N/A;  Anterior cervical decompression fusion, cervical 3-4, cervical 4-5, cervical 5-6 with instrumentation and allograft  . BACK SURGERY     X 3 1979, 1994, 1995  . CARPAL TUNNEL RELEASE Right   . CHOLECYSTECTOMY  2003  . CORONARY ANGIOPLASTY  2008  . EYE SURGERY Bilateral 2013   Eyelid lift   . FOOT SURGERY Right    BIG  TOE  . FOOT SURGERY Bilateral    PLANTAR FASCIITIS  . FOOT SURGERY Right    2ND TOE  . GALLBLADDER SURGERY    . HAMMER TOE SURGERY Right 10/17/2017   Procedure: HAMMER TOE CORRECTION-4TH TOE;  Surgeon: Albertine Patricia, DPM;  Location: [place];  Service: Podiatry;  Laterality: Right;  LMA- WITH LOCAL Diabetic - diet controlled  . HAND SURGERY Left   . HAND SURGEY Left   . HEART STENT  2009   LAD  . INFUSION PUMP IMPLANTATION     X2 with morphine and baclofen  . INTRATHECAL PUMP REVISION N/A 04/25/2018   Procedure: Intrathecal pump replacement;  Surgeon: Clydell Hakim, MD;   Location: [place];  Service: Neurosurgery;  Laterality: N/A;  right  . INTRATHECAL PUMP REVISION Right 04/25/2018  . INTRATHECAL PUMP REVISION N/A 12/12/2018   Procedure: Intrathecal pump revision with exploration of pocket;  Surgeon: Clydell Hakim, MD;  Location: [place];  Service: Neurosurgery;  Laterality: N/A;  Intrathecal pump revision with exploration of pocket  . IRRIGATION AND DEBRIDEMENT FOOT Right 02/23/2017   Procedure: IRRIGATION AND DEBRIDEMENT FOOT-right foot;  Surgeon: Samara Deist, DPM;  Location: [facility];  Service: Podiatry;  Laterality: Right;  . PAIN PUMP REVISION N/A 08/15/2018   Procedure: Intrathecal PUMP REVISION;  Surgeon: Clydell Hakim, MD;  Location: [place];  Service: Neurosurgery;  Laterality: N/A;  INTRATHECAL PUMP REVISION  . TOE SURGERY     then revision 8/18   Social History   Socioeconomic History  . Marital status: Significant Other    Spouse name: Not on file  . Number of children: Not on file  . Years of education: Not on file  . Highest education level: Not on file  Occupational History  . Not on file  Tobacco Use  . Smoking status: Former Smoker    Packs/day: 1.50    Years: 32.00    Pack years: 48.00    Types: Cigarettes    Quit date: 07/22/2007    Years since quitting: 12.4  . Smokeless tobacco: Never Used  Vaping Use  . Vaping Use: Never used  Substance and Sexual Activity  . Alcohol use: Yes    Alcohol/week: 0.0 standard drinks    Comment: occ - Holidays  . Drug use: No  . Sexual activity: Yes  Other Topics Concern  . Not on file  Social History Narrative   Lives at home with a partner. Independent at baseline   Social Determinants of Health   Financial Resource Strain:   . Difficulty of Paying Living Expenses:   Food Insecurity:   . Worried About Charity fundraiser in the Last Year:   . Arboriculturist in the Last Year:   Transportation Needs:   . Film/video editor (Medical):   Marland Kitchen Lack of Transportation (Non-Medical):    Physical Activity:   . Days of Exercise per Week:   . Minutes of Exercise per Session:   Stress:   . Feeling of Stress :   Social Connections:   . Frequency of Communication with Friends and Family:   . Frequency of Social Gatherings with Friends and Family:   . Attends Religious Services:   . Active Member of Clubs or Organizations:   . Attends Archivist Meetings:   Marland Kitchen Marital Status:    No Known Allergies Family History  Problem Relation Age of Onset  . Lung cancer Mother   . Diabetes Father   . Heart disease  Maternal Grandfather   . Diabetes Paternal Grandmother   . Colon cancer Paternal Grandfather   . Stroke Neg Hx      Current Outpatient Medications (Cardiovascular):  .  nitroGLYCERIN (NITROSTAT) 0.4 MG SL tablet, Place 1 tablet (0.4 mg total) under the tongue every 5 (five) minutes as needed for chest pain. Please keep upcoming appt in October. Thank you .  rosuvastatin (CRESTOR) 20 MG tablet, TAKE 1 TABLET BY MOUTH EVERY DAY  Current Outpatient Medications (Respiratory):  .  fluticasone (FLONASE) 50 MCG/ACT nasal spray, Place 2 sprays into both nostrils daily.  Current Outpatient Medications (Analgesics):  .  aspirin 81 MG tablet, Take 81 mg by mouth daily. Marland Kitchen  oxyCODONE (OXY IR/ROXICODONE) 5 MG immediate release tablet, Take 1 tablet (5 mg total) by mouth every 8 (eight) hours as needed for severe pain. Must last 30 days. Derrill Memo ON 01/01/2020] oxyCODONE (OXY IR/ROXICODONE) 5 MG immediate release tablet, Take 1 tablet (5 mg total) by mouth every 8 (eight) hours as needed for severe pain. Must last 30 days. Derrill Memo ON 01/31/2020] oxyCODONE (OXY IR/ROXICODONE) 5 MG immediate release tablet, Take 1 tablet (5 mg total) by mouth every 8 (eight) hours as needed for severe pain. Must last 30 days.   Current Outpatient Medications (Other):  Marland Kitchen  ACCU-CHEK AVIVA PLUS test strip, USE TO CHECK BLOOD SUGAR ONE TIME A DAY .  Accu-Chek Softclix Lancets lancets, USE TO  CHECK BLOOD SUGAR 1 TIME DAILY .  AMBULATORY NON FORMULARY MEDICATION, Medication Name: CPAP MASK OF CHOICE FOR HOME DEVICE .  buPROPion (WELLBUTRIN XL) 150 MG 24 hr tablet, TAKE 1 TABLET BY MOUTH EVERY DAY .  Cinnamon 500 MG TABS, Take 1,000 mg by mouth daily. .  Coenzyme Q10 (CO Q 10) 100 MG CAPS, Take 100 mg by mouth daily.  Marland Kitchen  erythromycin ophthalmic ointment, 1 application at bedtime. Marland Kitchen  gentamicin ointment (GARAMYCIN) 0.1 %, APPLY TO NOSE THREE TIMES DAILY .  glucosamine-chondroitin 500-400 MG tablet, Take 2 tablets by mouth daily.  .  Magnesium 250 MG TABS, Take 250 mg by mouth daily. .  Misc Natural Products (TART CHERRY ADVANCED PO), Take 1,200 mg by mouth at bedtime.  .  Multiple Vitamin (MULTIVITAMIN) tablet, Take 1 tablet by mouth daily. .  naloxone (NARCAN) 2 MG/2ML injection, Inject 1 mL (1 mg total) into the muscle as needed for up to 2 doses (for opioid overdose). Inject content of syringe into thigh muscle. Call 911. .  NONFORMULARY OR COMPOUNDED ITEM, 212.8 mcg by Epidural Infusion route. Medtronic Neuromodulation pump Fentanyl 212.66mcg/day via IT pump, Baclofen, bupivicaine .  NONFORMULARY OR COMPOUNDED ITEM, 88.77 mcg by Epidural Infusion route Continuous EPIDURAL. Medtronic Neuromodulation pump: Fentanyl, baclofen 88.77, bupivicane .  NONFORMULARY OR COMPOUNDED ITEM, 5.918 mg by Epidural Infusion route Continuous EPIDURAL. Medtronic Neuromodulation pump: Fentanyl, Baclofen, Bupivicaine 5.918 mg .  Omega-3 Fatty Acids (FISH OIL) 1000 MG CAPS, Take 2,000 mg by mouth 2 (two) times daily.  Marland Kitchen  PARoxetine (PAXIL) 20 MG tablet, TAKE ONE-HALF OF A TABLET BY MOUTH DAILY IN THE MORNING .  polyethylene glycol (MIRALAX / GLYCOLAX) packet, Take 17 g by mouth daily.  .  pregabalin (LYRICA) 150 MG capsule, Take 1 capsule (150 mg total) by mouth 3 (three) times daily. Marland Kitchen  tiZANidine (ZANAFLEX) 4 MG tablet, Take 1 tablet (4 mg total) by mouth 3 (three) times daily. Marland Kitchen  VITAMIN E PO, Take 450  Units by mouth daily.    Reviewed prior  external information including notes and imaging from  primary care provider As well as notes that were available from care everywhere and other healthcare systems.  Past medical history, social, surgical and family history all reviewed in electronic medical record.  No pertanent information unless stated regarding to the chief complaint.   Review of Systems:  No headache, visual changes, nausea, vomiting, diarrhea, constipation, dizziness, abdominal pain, skin rash, fevers, chills, night sweats, weight loss, swollen lymph nodes,  joint swelling, chest pain, shortness of breath, mood changes. POSITIVE muscle aches, body aches  Objective  Blood pressure 112/60, pulse 83, height 5\' 10"  (1.778 m), weight 218 lb (98.9 kg), SpO2 96 %.   General: No apparent distress alert and oriented x3 mood and affect normal, dressed appropriately.  HEENT: Pupils equal, extraocular movements intact  Respiratory: Patient's speak in full sentences and does not appear short of breath  Gait antalgic MSK: Knee: Right valgus deformity noted. Large thigh to calf ratio.  Tender to palpation over medial and PF joint line.  ROM full in flexion and extension and lower leg rotation. instability with valgus force.  painful patellar compression. Patellar glide with moderate crepitus. Patellar and quadriceps tendons unremarkable. Hamstring and quadriceps strength is normal. Contralateral knee shows mild OA    Impression and Recommendations:     The above documentation has been reviewed and is accurate and complete Lyndal Pulley, DO       Note: This dictation was prepared with Dragon dictation along with smaller phrase technology. Any transcriptional errors that result from this process are unintentional.

## 2019-12-14 NOTE — Assessment & Plan Note (Signed)
Patient for the last 3 years has responded fairly well to injections and aspiration.  Patient dry needling significantly giving more trouble again.  Patient aspiration seemed to help only minimally.  Increasing instability.  With patient's other comorbidities and increasing in back pain I am concerned that the Kinetics are causing more discomfort and pain at the moment.  I think patient should consider the possibility of a replacement.  Referred to orthopedic surgery to discuss.  We discussed about possible bracing again, home exercise, which activities to do which wants to avoid.  Patient will increase activity slowly.  Total time with patient today 33 minutes.

## 2020-01-12 DIAGNOSIS — B351 Tinea unguium: Secondary | ICD-10-CM | POA: Diagnosis not present

## 2020-01-12 DIAGNOSIS — E1142 Type 2 diabetes mellitus with diabetic polyneuropathy: Secondary | ICD-10-CM | POA: Diagnosis not present

## 2020-01-12 DIAGNOSIS — Z89421 Acquired absence of other right toe(s): Secondary | ICD-10-CM | POA: Diagnosis not present

## 2020-01-12 DIAGNOSIS — S9031XA Contusion of right foot, initial encounter: Secondary | ICD-10-CM | POA: Diagnosis not present

## 2020-01-12 DIAGNOSIS — L851 Acquired keratosis [keratoderma] palmaris et plantaris: Secondary | ICD-10-CM | POA: Diagnosis not present

## 2020-01-12 DIAGNOSIS — S90121A Contusion of right lesser toe(s) without damage to nail, initial encounter: Secondary | ICD-10-CM | POA: Diagnosis not present

## 2020-01-12 DIAGNOSIS — M79671 Pain in right foot: Secondary | ICD-10-CM | POA: Diagnosis not present

## 2020-01-14 DIAGNOSIS — T888XXD Other specified complications of surgical and medical care, not elsewhere classified, subsequent encounter: Secondary | ICD-10-CM | POA: Diagnosis not present

## 2020-01-18 ENCOUNTER — Telehealth: Payer: Self-pay | Admitting: Pulmonary Disease

## 2020-01-18 DIAGNOSIS — G4733 Obstructive sleep apnea (adult) (pediatric): Secondary | ICD-10-CM

## 2020-01-18 NOTE — Telephone Encounter (Signed)
Lm for pt.  Pt is past due for an appt. Last seen Dr. Juanell Fairly 05/2018.

## 2020-01-19 NOTE — Telephone Encounter (Signed)
Lm for pt

## 2020-01-19 NOTE — Telephone Encounter (Signed)
Yes that is fine

## 2020-01-19 NOTE — Telephone Encounter (Signed)
Pt called back, please return call  

## 2020-01-19 NOTE — Telephone Encounter (Addendum)
I will call [facility] on 01/20/2020 to confirm current settings, as our office did not originally prescribe cpap. Their office is currently closed.

## 2020-01-19 NOTE — Telephone Encounter (Signed)
Spoke to pt, who stated that the motor on her current cpap machine has exceeded life. Pt is requesting that a new order be placed to [facility].  Former Dr. Juanell Fairly pt last seen 05/2018. Pt has been scheduled for OV with Derl Barrow, NP at [facility] office, as our first available is not until 03/21/2020.  [place], please advise if okay to order new cpap?

## 2020-01-20 ENCOUNTER — Telehealth: Payer: Self-pay | Admitting: Internal Medicine

## 2020-01-20 NOTE — Telephone Encounter (Signed)
Pt is aware that order was placed to [facility] today for replacement of cpap.  Advised pt to reach out to our office in 1 week if Huey Romans has not contacted her.  Pt is aware and voiced her understanding.  Nothing further is needed at this time.

## 2020-01-20 NOTE — Telephone Encounter (Signed)
Spoke to [place] with [facility], and confirmed pt's current cpap settings.  Current settings are  Auto 9-20cm h2O. Order has been placed for replacement machine.  Pt is aware and voiced her understanding.  Nothing further is needed.

## 2020-01-27 DIAGNOSIS — M1711 Unilateral primary osteoarthritis, right knee: Secondary | ICD-10-CM | POA: Diagnosis not present

## 2020-01-27 DIAGNOSIS — M25561 Pain in right knee: Secondary | ICD-10-CM | POA: Diagnosis not present

## 2020-01-31 DIAGNOSIS — I951 Orthostatic hypotension: Secondary | ICD-10-CM

## 2020-01-31 HISTORY — DX: Orthostatic hypotension: I95.1

## 2020-02-01 ENCOUNTER — Other Ambulatory Visit: Payer: Self-pay

## 2020-02-02 ENCOUNTER — Encounter: Payer: Self-pay | Admitting: Intensive Care

## 2020-02-02 ENCOUNTER — Emergency Department
Admission: EM | Admit: 2020-02-02 | Discharge: 2020-02-02 | Disposition: A | Discharge status: Home / Self Care | Payer: Medicare Other | Attending: Emergency Medicine | Admitting: Emergency Medicine

## 2020-02-02 ENCOUNTER — Other Ambulatory Visit: Payer: Self-pay

## 2020-02-02 ENCOUNTER — Emergency Department: Payer: Medicare Other

## 2020-02-02 ENCOUNTER — Telehealth: Payer: Self-pay

## 2020-02-02 DIAGNOSIS — N1831 Chronic kidney disease, stage 3a: Secondary | ICD-10-CM | POA: Diagnosis not present

## 2020-02-02 DIAGNOSIS — Z7982 Long term (current) use of aspirin: Secondary | ICD-10-CM | POA: Diagnosis not present

## 2020-02-02 DIAGNOSIS — M792 Neuralgia and neuritis, unspecified: Secondary | ICD-10-CM | POA: Insufficient documentation

## 2020-02-02 DIAGNOSIS — R55 Syncope and collapse: Secondary | ICD-10-CM | POA: Diagnosis not present

## 2020-02-02 DIAGNOSIS — R42 Dizziness and giddiness: Secondary | ICD-10-CM | POA: Diagnosis not present

## 2020-02-02 DIAGNOSIS — F17210 Nicotine dependence, cigarettes, uncomplicated: Secondary | ICD-10-CM | POA: Diagnosis not present

## 2020-02-02 DIAGNOSIS — M549 Dorsalgia, unspecified: Secondary | ICD-10-CM | POA: Diagnosis not present

## 2020-02-02 DIAGNOSIS — I951 Orthostatic hypotension: Secondary | ICD-10-CM

## 2020-02-02 DIAGNOSIS — R531 Weakness: Secondary | ICD-10-CM | POA: Insufficient documentation

## 2020-02-02 DIAGNOSIS — I2510 Atherosclerotic heart disease of native coronary artery without angina pectoris: Secondary | ICD-10-CM | POA: Diagnosis not present

## 2020-02-02 DIAGNOSIS — E119 Type 2 diabetes mellitus without complications: Secondary | ICD-10-CM | POA: Insufficient documentation

## 2020-02-02 LAB — URINALYSIS, COMPLETE (UACMP) WITH MICROSCOPIC
Bacteria, UA: NONE SEEN
Bilirubin Urine: NEGATIVE
Glucose, UA: NEGATIVE mg/dL
Hgb urine dipstick: NEGATIVE
Ketones, ur: NEGATIVE mg/dL
Leukocytes,Ua: NEGATIVE
Nitrite: NEGATIVE
Protein, ur: NEGATIVE mg/dL
Specific Gravity, Urine: 1.014 (ref 1.005–1.030)
Squamous Epithelial / HPF: NONE SEEN (ref 0–5)
WBC, UA: NONE SEEN WBC/hpf (ref 0–5)
pH: 7 (ref 5.0–8.0)

## 2020-02-02 LAB — CBC
HCT: 39.7 % (ref 36.0–46.0)
Hemoglobin: 13.6 g/dL (ref 12.0–15.0)
MCH: 29.3 pg (ref 26.0–34.0)
MCHC: 34.3 g/dL (ref 30.0–36.0)
MCV: 85.6 fL (ref 80.0–100.0)
Platelets: 202 10*3/uL (ref 150–400)
RBC: 4.64 MIL/uL (ref 3.87–5.11)
RDW: 12.6 % (ref 11.5–15.5)
WBC: 6.6 10*3/uL (ref 4.0–10.5)
nRBC: 0 % (ref 0.0–0.2)

## 2020-02-02 LAB — BASIC METABOLIC PANEL
Anion gap: 11 (ref 5–15)
BUN: 28 mg/dL — ABNORMAL HIGH (ref 8–23)
CO2: 27 mmol/L (ref 22–32)
Calcium: 9.3 mg/dL (ref 8.9–10.3)
Chloride: 100 mmol/L (ref 98–111)
Creatinine, Ser: 1.39 mg/dL — ABNORMAL HIGH (ref 0.44–1.00)
GFR calc Af Amer: 47 mL/min — ABNORMAL LOW (ref >60)
GFR calc non Af Amer: 41 mL/min — ABNORMAL LOW (ref >60)
Glucose, Bld: 100 mg/dL — ABNORMAL HIGH (ref 70–99)
Potassium: 4.4 mmol/L (ref 3.5–5.1)
Sodium: 138 mmol/L (ref 135–145)

## 2020-02-02 LAB — TSH: TSH: 1.965 u[IU]/mL (ref 0.350–4.500)

## 2020-02-02 MED ORDER — MIDODRINE HCL 2.5 MG PO TABS: 2.5000 mg | ORAL_TABLET | Freq: Three times a day (TID) | ORAL | 0 refills | Status: DC

## 2020-02-02 MED ORDER — SODIUM CHLORIDE 0.9 % IV BOLUS
500.0000 mL | Freq: Once | INTRAVENOUS | Status: AC
  Administered 2020-02-02: 500 mL via INTRAVENOUS

## 2020-02-02 MED ORDER — PAIN MANAGEMENT IT PUMP REFILL: 1.0000 | Freq: Once | INTRATHECAL | 0 refills | Status: AC

## 2020-02-02 MED ORDER — MIDODRINE HCL 5 MG PO TABS
10.0000 mg | ORAL_TABLET | Freq: Once | ORAL | Status: AC
  Administered 2020-02-02: 10 mg via ORAL
  Filled 2020-02-02: qty 2

## 2020-02-02 NOTE — Telephone Encounter (Signed)
[facility] - Client TELEPHONE ADVICE RECORD [facility] Patient Name: Melinda [name] Gender: Female DOB: 04/20/1956 Age: 64 Y 30 M 17 D Return Phone Number: [phone] (Primary) Address: City/State/Zip: [place] [address] Client [name] - Client Client Site [facility] - Day Physician Melinda [name] - NP Contact Type Call Who Is Calling Patient / Member / Family / Caregiver Call Type Triage / Clinical Relationship To Patient Self Return Phone Number [phone] (Primary) Chief Complaint BLOOD PRESSURE LOW - Systolic (top number) 90 or less with dizzy or weak symptoms Reason for Call Symptomatic / Request for Health Information Initial Comment Caller states she is experiencing dizziness and her blood pressure is 85/53. Translation No Nurse Assessment Nurse: [name], RN, Kim Date/Time (Eastern Time): 02/02/2020 1:00:13 PM Confirm and document reason for call. If symptomatic, describe symptoms. ---Caller states she has had a few episodes of dizziness recently. States she has had 8 episodes of dizziness so far today, checked her BP during one of the events and it was 85/53 she felt really weak. Checked her BP just now while not having event and it is 111/88. Has the patient had close contact with a person known or suspected to have the novel coronavirus illness OR traveled / lives in area with major community spread (including international travel) in the last 14 days from the onset of symptoms? * If Asymptomatic, screen for exposure and travel within the last 14 days. ---No Does the patient have any new or worsening symptoms? ---Yes Will a triage be completed? ---Yes Related visit to physician within the last 2 weeks? ---No Does the PT have any chronic conditions? (i.e. diabetes, asthma, this includes High risk factors for pregnancy, etc.) ---Yes List chronic conditions. ---CPS, Spinal stenosis,  Chronic fatigue, Fibromyalgia, Cardiac stent Is this a behavioral health or substance abuse call? ---No Guidelines Guideline Title Affirmed Question Affirmed Notes Nurse Date/Time (Eastern Time) Blood Pressure - Low Patient sounds very sick or weak to the triager [name], RN, Kim 02/02/2020 1:07:48 PM PLEASE NOTE: All timestamps contained within this report are represented as [place] Standard Time. CONFIDENTIALTY NOTICE: This fax transmission is intended only for the addressee. It contains information that is legally privileged, confidential or otherwise protected from use or disclosure. If you are not the intended recipient, you are strictly prohibited from reviewing, disclosing, copying using or disseminating any of this information or taking any action in reliance on or regarding this information. If you have received this fax in error, please notify us immediately by telephone so that we can arrange for its return to [place]. Phone: [phone], Toll-Free: [phone], Fax: [phone] Page: 2 of 2 Call Id: [id number] [place]. Time [name] Time) Disposition Final User 02/02/2020 12:58:48 PM Send to Urgent Queue [name] 02/02/2020 1:09:01 PM Go to ED Now (or PCP triage) Yes [name], RN, [name] Disagree/Comply Comply Caller Understands Yes PreDisposition Did not know what to do Care Advice Given Per Guideline GO TO ED NOW (OR PCP TRIAGE): * IF NO PCP (PRIMARY CARE PROVIDER) SECOND-LEVEL TRIAGE: You need to be seen within the next hour. Go to the [facility] at _____________ [place] as soon as you can. ANOTHER ADULT SHOULD DRIVE: * It is better and safer if another adult drives instead of you. CARE ADVICE given per Low Blood Pressure (Adult) guideline. Comments User: [name], RN Date/Time [name] Time): 02/02/2020 1:10:18 PM Caller checked her BP again just now and it is 90/53. Referrals  GO TO FACILITY UNDECIDED

## 2020-02-02 NOTE — Telephone Encounter (Deleted)
[name] - Client TELEPHONE ADVICE RECORD [facility] Patient Name: Melinda [name] Gender: Female DOB: 02/09/1996 Age: 64 Y 11 M 23 D Return Phone Number: [phone] (Primary) Address: City/State/Zip: [place] [address] Client [name] - Client Client Site [facility] - [name] Contact Type Call Who Is Calling Patient / Member / Family / Caregiver Call Type Triage / Clinical Caller Name Melinda [name] Relationship To Patient Mother Return Phone Number [phone] (Primary) Chief Complaint CHEST PAIN (>=21 years) - pain, pressure, heaviness or tightness Reason for Call Symptomatic / Request for [place] states, pt fainted last week, dizzy, chest pain and dehydration. Additional Comment He was out in the sun all weekend and drinking. Translation No Nurse Assessment Nurse: [name], RN, Kim Date/Time [name] Time): 02/02/2020 12:02:22 PM Confirm and document reason for call. If symptomatic, describe symptoms. ---Caller states he has had recent episodes of dizziness and chest pain that come and go, had one episode of passing out last week. Has the patient had close contact with a person known or suspected to have the novel coronavirus illness OR traveled / lives in area with major community spread (including international travel) in the last 14 days from the onset of symptoms? * If Asymptomatic, screen for exposure and travel within the last 14 days. ---No Does the patient have any new or worsening symptoms? ---Yes Will a triage be completed? ---Yes Related visit to physician within the last 2 weeks? ---No Does the PT have any chronic conditions? (i.e. diabetes, asthma, this includes High risk factors for pregnancy, etc.) ---No Is this a behavioral health or substance abuse call? ---No Guidelines Guideline Title Affirmed Question Affirmed Notes Nurse Date/Time  [name] Time) Chest Pain [1] Chest pain lasting < 5 minutes AND [2] NO chest pain or cardiac symptoms (e.g., breathing difficulty, sweating) now (Exception: chest [name], RN, Maudie Mercury 02/02/2020 12:03:34 PM PLEASE NOTE: All timestamps contained within this report are represented as [place] Standard Time. CONFIDENTIALTY NOTICE: This fax transmission is intended only for the addressee. It contains information that is legally privileged, confidential or otherwise protected from use or disclosure. If you are not the intended recipient, you are strictly prohibited from reviewing, disclosing, copying using or disseminating any of this information or taking any action in reliance on or regarding this information. If you have received this fax in error, please notify us immediately by telephone so that we can arrange for its return to [place]. Phone: [phone], Toll-Free: [phone], Fax: [phone] Page: 2 of 2 Call Id: [id number] Guidelines Guideline Title Affirmed Question Affirmed Notes Nurse Date/Time [name] Time) pains that last only a few seconds) Disp. Time [name] Time) Disposition Final User 02/02/2020 11:45:40 AM Send to Urgent Queue Lonia Farber 02/02/2020 11:53:39 AM Clinical Call [name], RN, Kim 02/02/2020 11:54:02 AM Send To RN Personal [name], RN, Kim 02/02/2020 12:07:32 PM See PCP within 24 Hours Yes [name], RN, [name] Disagree/Comply Comply Caller Understands Yes PreDisposition Did not know what to do Care Advice Given Per Guideline SEE PCP WITHIN 24 HOURS: * IF OFFICE WILL BE OPEN: You need to be examined within the next 24 hours. Call your doctor (or NP/PA) when the office opens and make an appointment. CALL BACK IF: * Difficulty breathing occurs * Chest pain increases in frequency, duration or severity * Chest pain lasts over 5 minutes * You become worse. CARE ADVICE given per Chest Pain (Adult) guideline. Comments User: [name], RN Date/Time [name]  Time): 02/02/2020  11:53:24 AM Caller states she is calling to schedule an appt for her son because he has had recent episodes of dizziness, chest pain and one episode of fainting. Son is currently at work and goes to lunch at 12pm; caller asks nurse to call at that time for triage. Referrals REFERRED TO PCP OFFICE

## 2020-02-02 NOTE — Discharge Instructions (Signed)
You were seen today for lightheadedness.  Your CT head was negative for acute findings.  You were found to be orthostatic in the ER which means your blood pressure drops when you change positions.  This is the cause of your lightheadedness.  You were slightly dehydrated so we did give you a fluid bolus.  Please avoid NSAIDs OTC and consume adequate amounts of water daily.  I have started you on a medication to help keep your blood pressure from dropping with position changes.  Please take this as prescribed.  Please follow-up with your PCP in 1 week for hospital follow-up.

## 2020-02-02 NOTE — Telephone Encounter (Signed)
Per chart review tab pt is presently at [facility] ED. Dr Silvio Pate and Avie Echevaria NP are both out of office this afternoon. FYI to Dr Danise Mina.

## 2020-02-02 NOTE — Telephone Encounter (Signed)
Noted! Thank you

## 2020-02-02 NOTE — ED Notes (Signed)
Pt unable to sign E-signature due to signature pad malfunction. Pt verbalized understanding of d/c instructions and had no additional questions or concerns for this RN or provider. Pt left with d/c instructions and gathered all personal belongings from room and removed them prior to ED departure.   

## 2020-02-02 NOTE — ED Triage Notes (Addendum)
Patient c/o near syncope 10+ times today and reports hypotension at home. Denies LOC. Denies hitting head. A&O x4 in triage. Ambulatory into triage. Patient reports she has pain pump with fentanyl, baclofen, and bupivacaine and takes oxycodone PO

## 2020-02-02 NOTE — ED Provider Notes (Signed)
[facility] Emergency Department Provider Note ____________________________________________  Time seen: 1930  I have reviewed the triage vital signs and the nursing notes.  HISTORY  Chief Complaint  Near Syncope and Hypotension   HPI Melinda [name] is a 64 y.o. female presents to the ER today with complaint of lightheadedness.  She reports this has been intermittent over the last month she noticed this has been worse today.  She reports the lightheadedness is worse with position changes, not necessarily head movement. She does reports associated confusion with these episodes that resolves when the lightheadedness improves.  She denies sensation that the room is spinning, headache, visual changes, diaphoresis, chest pain, chest tightness, shortness of breath, syncopal episode or fall.  She denies recent changes in diet.  She reports starting Cymbalta in the last few months but otherwise no new medication changes.  She does have a intrathecal pain pump and takes multiple sedating medications including oxycodone, pregabalin and tizanidine.  She has been checking her blood pressure at home when she feels lightheaded and reports her BP is typically 80s over 50s.  She denies palpitations. She has not taken anything OTC for these symptoms.  Past Medical History:  Diagnosis Date  . Amputation of great toe, right, traumatic ([place]) 05/30/2010  . Amputation of second toe, right, traumatic ([place]) 09/2017  . Anemia    years ago  . Anxiety   . Blue toes    2nd toe on right foot, will get appt.  . Bulging disc   . CAD (coronary artery disease) 2009   s/p stent to LAD  . 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  Ablations done.  Now has rare episodes  . Chicken pox   . Chronic fatigue   . Chronic kidney disease    "problem with kidney filtration"  . Coronary arteritis   . Degenerative disc disease   . Depression   . Diabetes mellitus without complication  ([place])   . Facet joint disease   . Fibromyalgia   . Heart disease   . Hyperlipidemia   . IBS (irritable bowel syndrome)   . MCL deficiency, knee   . MRSA (methicillin resistant staph aureus) culture positive 2011   GREAT TOE RIGHT FOOT  . Neuropathy 04/18/2010  . Peripheral neuropathy   . Renal insufficiency   . Restless leg syndrome   . Sepsis ([place]) 09/2013  . Sleep apnea 08/17/2003   uses CPAP, sleep study at [facility] (mild to moderate)  . Spinal stenosis     Patient Active Problem List   Diagnosis Date Noted  . Thoracic facet hypertrophy/arthropathy (Multilevel) (Bilateral) 12/09/2019  . Thoracic facet syndrome (Multilevel) (Bilateral) 12/09/2019  . Encounter for interrogation of infusion pump 12/09/2019  . Sensation disturbance of skin 07/08/2019  . Allodynia 07/08/2019  . Hyperalgesia 07/08/2019  . Chronic low back pain (Bilateral) w/o sciatica 06/23/2019  . DDD (degenerative disc disease), lumbosacral 06/23/2019  . 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 Spondylosis without myelopathy or radiculopathy, lumbosacral region 11/05/2018  . CKD (chronic kidney disease) stage 3, GFR 30-59 ml/min 09/04/2018  . 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 08/06/2018  . DDD (degenerative disc disease), lumbar 05/15/2018  . Abnormal MRI, lumbar spine (04/01/2018) 05/06/2018  . Lumbar facet hypertrophy (Multilevel) 05/06/2018  . Lumbar foraminal stenosis 05/06/2018  . Lumbar lateral recess stenosis 05/06/2018  . Lumbar spinal stenosis w/ neurogenic claudication 04/09/2018  . Degenerative arthritis  of knee (Bilateral) 03/12/2018  . Lumbar spondylosis 01/23/2018  . Hip pain, acute, left 01/01/2018  . Other secondary hypertension 08/09/2017  . OSA (obstructive sleep apnea) 08/09/2017  . Osteoarthritis of knee (Bilateral) (R>L) 06/08/2016  . Long term current use of opiate analgesic 04/21/2016  . Long term prescription opiate use  04/21/2016  . Opiate use 04/21/2016  . Encounter for adjustment or management of infusion pump 04/21/2016  . Osteoarthritis, multiple sites 04/21/2016  . Chronic musculoskeletal pain 04/21/2016  . Anxiety and depression 09/11/2015  . Opioid-induced constipation (OIC) 07/27/2015  . Chronic knee pain (Primary Area of Pain) (Right) 07/27/2015  . Osteoarthritis of knee (Left) 07/27/2015  . 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 Chronic neck pain 05/24/2015  . Chronic low back pain (Bilateral) (L>R) w/ sciatica (Right) 05/24/2015  . Epidural fibrosis 05/24/2015  . Cervical spondylosis 05/24/2015  . Neuropathic pain 05/24/2015  . Lumbar facet syndrome 05/24/2015  . Fibromyalgia 05/24/2015  . Presence of functional implant (Medtronic programmable intrathecal pump) (Right abdominal area) 05/24/2015  . Presence of intrathecal pump (Medtronic intrathecal programmable pump) (40 mL pump) 05/24/2015  . Cervical spondylosis 05/24/2015  . Type 2 diabetes mellitus with diabetic mononeuropathy, without long-term current use of insulin ([place]) 04/27/2014  . Pure hypercholesterolemia 03/02/2014  . CAD (coronary artery disease) 10/04/2013  . Atherosclerotic heart disease of native coronary artery without angina pectoris 10/04/2013  . Chronic pain syndrome 04/18/2010    Past Surgical History:  Procedure Laterality Date  . ABLATION     UTERUS  . ABLATION     HEART  . AMPUTATION TOE Right 02/01/2017   Procedure: AMPUTATION TOE-RIGHT 2ND MPJ;  Surgeon: Albertine Patricia, DPM;  Location: [facility];  Service: Podiatry;  Laterality: Right;  . ANTERIOR CERVICAL DECOMP/DISCECTOMY FUSION N/A 07/28/2014   Procedure: ANTERIOR CERVICAL DECOMPRESSION/DISCECTOMY FUSION CERVICAL 3-4,4-5,5-6 LEVELS WITH INSTRUMENTATION AND ALLOGRAFT;  Surgeon: Sinclair Ship, MD;  Location: [place];  Service: Orthopedics;  Laterality: N/A;  Anterior cervical decompression  fusion, cervical 3-4, cervical 4-5, cervical 5-6 with instrumentation and allograft  . BACK SURGERY     X 3 1979, 1994, 1995  . CARPAL TUNNEL RELEASE Right   . CHOLECYSTECTOMY  2003  . CORONARY ANGIOPLASTY  2008  . EYE SURGERY Bilateral 2013   Eyelid lift   . FOOT SURGERY Right    BIG TOE  . FOOT SURGERY Bilateral    PLANTAR FASCIITIS  . FOOT SURGERY Right    2ND TOE  . GALLBLADDER SURGERY    . HAMMER TOE SURGERY Right 10/17/2017   Procedure: HAMMER TOE CORRECTION-4TH TOE;  Surgeon: Albertine Patricia, DPM;  Location: [place];  Service: Podiatry;  Laterality: Right;  LMA- WITH LOCAL Diabetic - diet controlled  . HAND SURGERY Left   . HAND SURGEY Left   . HEART STENT  2009   LAD  . INFUSION PUMP IMPLANTATION     X2 with morphine and baclofen  . INTRATHECAL PUMP REVISION N/A 04/25/2018   Procedure: Intrathecal pump replacement;  Surgeon: Clydell Hakim, MD;  Location: [place];  Service: Neurosurgery;  Laterality: N/A;  right  . INTRATHECAL PUMP REVISION Right 04/25/2018  . INTRATHECAL PUMP REVISION N/A 12/12/2018   Procedure: Intrathecal pump revision with exploration of pocket;  Surgeon: Clydell Hakim, MD;  Location: [place];  Service: Neurosurgery;  Laterality: N/A;  Intrathecal pump revision with exploration of pocket  . IRRIGATION AND DEBRIDEMENT FOOT Right 02/23/2017   Procedure:  IRRIGATION AND DEBRIDEMENT FOOT-right foot;  Surgeon: Samara Deist, DPM;  Location: [facility];  Service: Podiatry;  Laterality: Right;  . PAIN PUMP REVISION N/A 08/15/2018   Procedure: Intrathecal PUMP REVISION;  Surgeon: Clydell Hakim, MD;  Location: [place];  Service: Neurosurgery;  Laterality: N/A;  INTRATHECAL PUMP REVISION  . TOE SURGERY     then revision 8/18    Prior to Admission medications   Medication Sig Start Date End Date Taking? Authorizing Provider  ACCU-CHEK AVIVA PLUS test strip USE TO CHECK BLOOD SUGAR ONE TIME A DAY 07/02/19   Jearld Fenton, NP  Accu-Chek Softclix Lancets  lancets USE TO CHECK BLOOD SUGAR 1 TIME DAILY 08/04/19   Jearld Fenton, NP  AMBULATORY NON FORMULARY MEDICATION Medication Name: CPAP MASK OF CHOICE FOR HOME DEVICE 07/01/15   Laverle Hobby, MD  aspirin 81 MG tablet Take 81 mg by mouth daily.    [provider]  buPROPion (WELLBUTRIN XL) 150 MG 24 hr tablet TAKE 1 TABLET BY MOUTH EVERY DAY 05/19/19   Cole Klugh, Coralie Keens, NP  Cinnamon 500 MG TABS Take 1,000 mg by mouth daily.    [provider]  Coenzyme Q10 (CO Q 10) 100 MG CAPS Take 100 mg by mouth daily.     [provider]  erythromycin ophthalmic ointment 1 application at bedtime.    [provider]  fluticasone (FLONASE) 50 MCG/ACT nasal spray Place 2 sprays into both nostrils daily. 03/16/19   [provider]  gentamicin ointment (GARAMYCIN) 0.1 % APPLY TO NOSE THREE TIMES DAILY 03/16/19   [provider]  glucosamine-chondroitin 500-400 MG tablet Take 2 tablets by mouth daily.     [provider]  Magnesium 250 MG TABS Take 250 mg by mouth daily.    [provider]  midodrine (PROAMATINE) 2.5 MG tablet Take 1 tablet (2.5 mg total) by mouth 3 (three) times daily with meals. 02/02/20   Jearld Fenton, NP  Misc Natural Products (TART CHERRY ADVANCED PO) Take 1,200 mg by mouth at bedtime.     [provider]  Multiple Vitamin (MULTIVITAMIN) tablet Take 1 tablet by mouth daily.    [provider]  naloxone [facility]) 2 MG/2ML injection Inject 1 mL (1 mg total) into the muscle as needed for up to 2 doses (for opioid overdose). Inject content of syringe into thigh muscle. Call 911. 08/06/18   [name], MD  nitroGLYCERIN (NITROSTAT) 0.4 MG SL tablet Place 1 tablet (0.4 mg total) under the tongue every 5 (five) minutes as needed for chest pain. Please keep upcoming appt in October. Thank you 03/05/19   Jerline Pain, MD  NONFORMULARY OR COMPOUNDED ITEM 212.8 mcg by Epidural Infusion route. Medtronic  Neuromodulation pump Fentanyl 212.1mcg/day via IT pump, Baclofen, bupivicaine    [provider]  NONFORMULARY OR COMPOUNDED ITEM 88.77 mcg by Epidural Infusion route Continuous EPIDURAL. Medtronic Neuromodulation pump: Fentanyl, baclofen 88.77, bupivicane    [provider]  NONFORMULARY OR COMPOUNDED ITEM 5.918 mg by Epidural Infusion route Continuous EPIDURAL. Medtronic Neuromodulation pump: Fentanyl, Baclofen, Bupivicaine 5.918 mg    [provider]  Omega-3 Fatty Acids (FISH OIL) 1000 MG CAPS Take 2,000 mg by mouth 2 (two) times daily.     [provider]  oxyCODONE (OXY IR/ROXICODONE) 5 MG immediate release tablet Take 1 tablet (5 mg total) by mouth every 8 (eight) hours as needed for severe pain. Must last 30 days. 12/02/19 01/01/20  [name], MD  oxyCODONE (  OXY IR/ROXICODONE) 5 MG immediate release tablet Take 1 tablet (5 mg total) by mouth every 8 (eight) hours as needed for severe pain. Must last 30 days. 01/01/20 01/31/20  [name], MD  oxyCODONE (OXY IR/ROXICODONE) 5 MG immediate release tablet Take 1 tablet (5 mg total) by mouth every 8 (eight) hours as needed for severe pain. Must last 30 days. 01/31/20 03/01/20  [name], MD  PAIN MANAGEMENT IT PUMP REFILL 1 each by Intrathecal route once for 1 dose. Medication: PF Fentanyl 1,0000.0 mcg/ml PF Baclofen 240.0 mcg/ml PF Bupivacaine 20.0 mg/ml Total Volume: 40.0 ml Needed by 02/23/2020 10:00 02/02/20 02/02/20  [name], MD  PARoxetine (PAXIL) 20 MG tablet TAKE ONE-HALF OF A TABLET BY MOUTH DAILY IN THE MORNING 08/17/19   Anushka [place]inger, Coralie Keens, NP  polyethylene glycol (MIRALAX / GLYCOLAX) packet Take 17 g by mouth daily.     [provider]  pregabalin (LYRICA) 150 MG capsule Take 1 capsule (150 mg total) by mouth 3 (three) times daily. 11/02/19 04/30/20  [name], MD  rosuvastatin (CRESTOR) 20 MG tablet TAKE 1 TABLET BY MOUTH EVERY DAY 06/17/19   Jerline Pain, MD   tiZANidine (ZANAFLEX) 4 MG tablet Take 1 tablet (4 mg total) by mouth 3 (three) times daily. 11/02/19 04/30/20  [name], MD  VITAMIN E PO Take 450 Units by mouth daily.     [provider]    Allergies Patient has no known allergies.  Family History  Problem Relation Age of Onset  . Lung cancer Mother   . Diabetes Father   . Heart disease Maternal Grandfather   . Diabetes Paternal Grandmother   . Colon cancer Paternal Grandfather   . Stroke Neg Hx     Social History Social History   Tobacco Use  . Smoking status: Former Smoker    Packs/day: 1.50    Years: 32.00    Pack years: 48.00    Types: Cigarettes    Quit date: 07/22/2007    Years since quitting: 12.5  . Smokeless tobacco: Never Used  Vaping Use  . Vaping Use: Never used  Substance Use Topics  . Alcohol use: Yes    Alcohol/week: 0.0 standard drinks    Comment: occ - Holidays  . Drug use: Yes    Comment: prescribed pain pump and oxy    Review of Systems  Constitutional: Negative for fever, chills or body aches. Eyes: Negative for visual changes. Cardiovascular: Negative for chest pain or chest tightness. Respiratory: Negative for cough or shortness of breath. Gastrointestinal: Negative for abdominal pain, nausea, vomiting and diarrhea. Genitourinary: Negative for urinary urgency, frequency, dysuria or blood in her urine. Musculoskeletal: Positive for chronic back pain, generalized weakness. Skin: Negative for rash. Neurological: Positive for chronic neuropathic pain. Negative for headaches. ____________________________________________  PHYSICAL EXAM:  VITAL SIGNS: ED Triage Vitals  Enc Vitals Group     BP 02/02/20 1456 117/73     Pulse Rate 02/02/20 1456 80     Resp 02/02/20 1456 16     Temp 02/02/20 1456 98.5 F (36.9 C)     Temp Source 02/02/20 1456 Oral     SpO2 02/02/20 1456 98 %     Weight 02/02/20 1457 220 lb (99.8 kg)     Height 02/02/20 1457 5\' 10"  (1.778 m)     Head  Circumference --      Peak Flow --      Pain Score 02/02/20 1456 3     Pain Loc --  Pain Edu? --      Excl. in [place]? --     Constitutional: Alert and oriented. Well appearing and in no distress. Head: Normocephalic. Eyes: Conjunctivae are normal. PERRL. Normal extraocular movements Ears: Canals clear. TMs intact bilaterally. ENT: Mucous membranes dry. Cardiovascular: Normal rate, regular rhythm. Respiratory: Normal respiratory effort. No wheezes/rales/rhonchi. Musculoskeletal: Strength 5/5 BUE/BLE. Neurologic:  Normal gait without ataxia. Normal speech and language. Coordination normal. Skin:  Skin is warm, dry and intact.  Psychiatric: Mood and affect are normal. Patient exhibits appropriate insight and judgment. ____________________________________________    LABS Labs Reviewed  BASIC METABOLIC PANEL - Abnormal; Notable for the following components:      Result Value   Glucose, Bld 100 (*)    BUN 28 (*)    Creatinine, Ser 1.39 (*)    GFR calc non Af Amer 41 (*)    GFR calc Af Amer 47 (*)    All other components within normal limits  URINALYSIS, COMPLETE (UACMP) WITH MICROSCOPIC - Abnormal; Notable for the following components:   Color, Urine YELLOW (*)    APPearance HAZY (*)    All other components within normal limits  CBC  TSH  HEMOGLOBIN A1C    ____________________________________________  EKG ED ECG REPORT   Date: 02/02/2020  EKG Time: 10:42 PM  Rate:70  Rhythm: normal sinus rhythm,  normal EKG, normal sinus rhythm ____________________________________________   RADIOLOGY  Imaging Orders     CT Head Wo Contrast IMPRESSION:  1. No acute intracranial abnormality.  2. Stable parenchymal volume loss and chronic microvascular  angiopathy.    ____________________________________________    INITIAL IMPRESSION / ASSESSMENT AND PLAN / ED COURSE  Lightheadedness, Confusion:  DDx include dehydration, POTS, orthostasis CBC, CMET, TSH, A1C- shows elevated  creatinine, concerning for dehydration Urinalysis normal EKG normal CT head negative for acute findings Orthostatics positive 500 mg NS bolus Orthostatics repeated- positive Midodrine 10 mg PO x 1 RX for Midodrine 2.5 mg PO TID  Follow up with PCP as an outpatient  ____________________________________________  FINAL CLINICAL IMPRESSION(S) / ED DIAGNOSES  Final diagnoses:  Orthostatic hypotension      Jearld Fenton, NP 02/02/20 2243    Harvest Dark, MD 02/02/20 2310

## 2020-02-02 NOTE — ED Notes (Addendum)
Patient given warm blanket. NS infusing over an hour per provider.

## 2020-02-02 NOTE — Telephone Encounter (Cosign Needed)
I am working at the ER she is at, will follow.

## 2020-02-03 ENCOUNTER — Ambulatory Visit (INDEPENDENT_AMBULATORY_CARE_PROVIDER_SITE_OTHER): Payer: Medicare Other | Admitting: Primary Care

## 2020-02-03 ENCOUNTER — Encounter: Payer: Self-pay | Admitting: Primary Care

## 2020-02-03 VITALS — BP 98/82 | HR 78 | Temp 97.3°F | Ht 70.0 in | Wt 224.6 lb

## 2020-02-03 DIAGNOSIS — G4733 Obstructive sleep apnea (adult) (pediatric): Secondary | ICD-10-CM

## 2020-02-03 LAB — HEMOGLOBIN A1C
Hgb A1c MFr Bld: 6.2 % — ABNORMAL HIGH (ref 4.8–5.6)
Mean Plasma Glucose: 131.24 mg/dL

## 2020-02-03 NOTE — Progress Notes (Signed)
@Patient  ID: Melinda [name], female    DOB: [birth year], 64 y.o.   MRN: [id number]  Chief Complaint  Patient presents with  . Follow-up    OSA    Referring provider: Jearld Fenton, NP  HPI: 64 year old female, former smoker.  Past medical history significant for obstructive sleep apnea, daytime sleepiness. Former Dr. Ashby Dawes, last seen in November 2019. Maintained on auto CPAP 9-20cm h20. She has been on Provigil and Nuvigil in the past which have subsequently been weaned off. Chronic pain managed by pain clinic.   02/03/2020 Patient presents today for follow-up. She has not been seen in over 2.5 years. She had her own CPAP machine which stopped working. She has been without machine for 2 weeks. [facility] is sending her a new CPAP. She wears full nasal mask. Normal bedtime varies. She generally wakes up between 8-8:30am. She will sometimes fall asleep in the chair. She does some pursed lip breathing at night, she has had a chin strap with her mask but this didn't particularly help. She sleeps well. She sleeps on her back. CPAP does help her sleep. She quit smoking.    Review and summary of old records, and previous testing: -Sleep study from 05/20/2003: AHI of 11, consistent with mild obstructive sleep apnea. Titration study from 08/17/2003, patient required a CPAP of 9, she was recommended to be on AutoPap of 5-18, though 9 appeared to be adequate. On my review of the tracings, it would appear that the patient had a residual AHI of only 3 with a CPAP of 9, indicating a level of 9 would be adequate. -Auto titrate study October 2014: My review of the tracings: CPAP with a low of 9 and a high of 20, showed very well-controlled apneas. Residual AHI of 1.7 with a median level of 10 -Review of the most recent charting shows that the patient as of 05/20/2014 was doing well on a ResMed S9 through [place], [place]. 95th percentile was 11 with 100% compliance. Her residual AHI was 1.7. -In the past for  CPAP level is been adjusted to between a level of 12 and 15. She is a history of smoking which she quit, history of restless leg syndrome-she has been on Nuvigil in the past   No Known Allergies  Immunization History  Administered Date(s) Administered  . Influenza,inj,Quad PF,6+ Mos 07/28/2015, 05/18/2016, 08/06/2017, 02/13/2019  . Influenza-Unspecified 05/19/2014  . PFIZER SARS-COV-2 Vaccination 09/19/2019, 10/13/2019  . Pneumococcal Polysaccharide-23 07/28/2015  . Rabies, IM 07/09/2019, 07/12/2019, 07/16/2019, 07/23/2019  . Tdap 08/03/2007, 07/09/2019  . Zoster Recombinat (Shingrix) 03/12/2019    Past Medical History:  Diagnosis Date  . 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 Amputation of second toe, right, traumatic ([place]) 09/2017  . Anemia    years ago  . Anxiety   . Blue toes    2nd toe on right foot, will get appt.  . Bulging disc   . CAD (coronary artery disease) 2009   s/p stent to LAD  . 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  Ablations done.  Now has rare episodes  . Chicken pox   . Chronic fatigue   . Chronic kidney disease    "problem with kidney filtration"  . Coronary arteritis   . Degenerative disc disease   . Depression   . Diabetes mellitus without complication ([place])   . Facet joint disease   . Fibromyalgia   . Heart disease   . Hyperlipidemia   .  IBS (irritable bowel syndrome)   . MCL deficiency, knee   . MRSA (methicillin resistant staph aureus) culture positive 2011   GREAT TOE RIGHT FOOT  . Neuropathy 04/18/2010  . Orthostatic hypotension 01/2020  . Peripheral neuropathy   . Renal insufficiency   . Restless leg syndrome   . Sepsis ([place]) 09/2013  . Sleep apnea 08/17/2003   uses CPAP, sleep study at [facility] (mild to moderate)  . Spinal stenosis     Tobacco History: Social History   Tobacco Use  Smoking Status Former Smoker  . Packs/day: 1.50  . Years: 32.00  . Pack years: 48.00  .  Types: Cigarettes  . Quit date: 07/22/2007  . Years since quitting: 12.6  Smokeless Tobacco Never Used   Counseling given: Not Answered   Outpatient Medications Prior to Visit  Medication Sig Dispense Refill  . ACCU-CHEK AVIVA PLUS test strip USE TO CHECK BLOOD SUGAR ONE TIME A DAY 100 strip 5  . Accu-Chek Softclix Lancets lancets USE TO CHECK BLOOD SUGAR 1 TIME DAILY 100 each 2  . AMBULATORY NON FORMULARY MEDICATION Medication Name: CPAP MASK OF CHOICE FOR HOME DEVICE 1 each 0  . aspirin 81 MG tablet Take 81 mg by mouth daily.    . Cinnamon 500 MG TABS Take 1,000 mg by mouth daily.    . Coenzyme Q10 (CO Q 10) 100 MG CAPS Take 100 mg by mouth daily.     . fluticasone (FLONASE) 50 MCG/ACT nasal spray Place 2 sprays into both nostrils daily.    Marland Kitchen glucosamine-chondroitin 500-400 MG tablet Take 2 tablets by mouth daily.     . Magnesium 250 MG TABS Take 250 mg by mouth daily.    . Misc Natural Products (TART CHERRY ADVANCED PO) Take 1,200 mg by mouth at bedtime.     . Multiple Vitamin (MULTIVITAMIN) tablet Take 1 tablet by mouth daily.    . naloxone (NARCAN) 2 MG/2ML injection Inject 1 mL (1 mg total) into the muscle as needed for up to 2 doses (for opioid overdose). Inject content of syringe into thigh muscle. Call 911. 2 Syringe 1  . nitroGLYCERIN (NITROSTAT) 0.4 MG SL tablet Place 1 tablet (0.4 mg total) under the tongue every 5 (five) minutes as needed for chest pain. Please keep upcoming appt in October. Thank you 25 tablet 1  . NONFORMULARY OR COMPOUNDED ITEM 212.8 mcg by Epidural Infusion route. Medtronic Neuromodulation pump Fentanyl 212.13mcg/day via IT pump, Baclofen, bupivicaine    . NONFORMULARY OR COMPOUNDED ITEM 88.77 mcg by Epidural Infusion route Continuous EPIDURAL. Medtronic Neuromodulation pump: Fentanyl, baclofen 88.77, bupivicane    . NONFORMULARY OR COMPOUNDED ITEM 5.918 mg by Epidural Infusion route Continuous EPIDURAL. Medtronic Neuromodulation pump: Fentanyl, Baclofen,  Bupivicaine 5.918 mg    . Omega-3 Fatty Acids (FISH OIL) 1000 MG CAPS Take 2,000 mg by mouth 2 (two) times daily.     Marland Kitchen PARoxetine (PAXIL) 20 MG tablet TAKE ONE-HALF OF A TABLET BY MOUTH DAILY IN THE MORNING 90 tablet 0  . polyethylene glycol (MIRALAX / GLYCOLAX) packet Take 17 g by mouth daily.     . pregabalin (LYRICA) 150 MG capsule Take 1 capsule (150 mg total) by mouth 3 (three) times daily. 270 capsule 1  . rosuvastatin (CRESTOR) 20 MG tablet TAKE 1 TABLET BY MOUTH EVERY DAY 90 tablet 3  . tiZANidine (ZANAFLEX) 4 MG tablet Take 1 tablet (4 mg total) by mouth 3 (three) times daily. 270 tablet 1  . VITAMIN E PO  Take 450 Units by mouth daily.     Marland Kitchen buPROPion (WELLBUTRIN XL) 150 MG 24 hr tablet TAKE 1 TABLET BY MOUTH EVERY DAY 90 tablet 2  . erythromycin ophthalmic ointment 1 application at bedtime.    Marland Kitchen gentamicin ointment (GARAMYCIN) 0.1 % APPLY TO NOSE THREE TIMES DAILY    . midodrine (PROAMATINE) 2.5 MG tablet Take 1 tablet (2.5 mg total) by mouth 3 (three) times daily with meals. 30 tablet 0  . oxyCODONE (OXY IR/ROXICODONE) 5 MG immediate release tablet Take 1 tablet (5 mg total) by mouth every 8 (eight) hours as needed for severe pain. Must last 30 days. 90 tablet 0  . oxyCODONE (OXY IR/ROXICODONE) 5 MG immediate release tablet Take 1 tablet (5 mg total) by mouth every 8 (eight) hours as needed for severe pain. Must last 30 days. 90 tablet 0  . oxyCODONE (OXY IR/ROXICODONE) 5 MG immediate release tablet Take 1 tablet (5 mg total) by mouth every 8 (eight) hours as needed for severe pain. Must last 30 days. 90 tablet 0   No facility-administered medications prior to visit.    Review of Systems  Review of Systems  Constitutional: Negative.   Respiratory: Negative for apnea, cough, shortness of breath and wheezing.   Psychiatric/Behavioral: Negative for sleep disturbance.    Physical Exam  BP 98/82 (BP Location: Left Arm, Cuff Size: Normal)   Pulse 78   Temp (!) 97.3 F (36.3 C)  (Oral)   Ht 5\' 10"  (1.778 m)   Wt 224 lb 9.6 oz (101.9 kg)   LMP  (LMP Unknown)   SpO2 97%   BMI 32.23 kg/m  Physical Exam Constitutional:      Appearance: Normal appearance.  HENT:     Mouth/Throat:     Mouth: Mucous membranes are moist.     Pharynx: Oropharynx is clear.  Cardiovascular:     Rate and Rhythm: Normal rate and regular rhythm.  Pulmonary:     Effort: Pulmonary effort is normal.     Breath sounds: Normal breath sounds.  Neurological:     General: No focal deficit present.     Mental Status: She is alert and oriented to person, place, and time. Mental status is at baseline.  Psychiatric:        Mood and Affect: Mood normal.        Behavior: Behavior normal.        Thought Content: Thought content normal.        Judgment: Judgment normal.      Lab Results:  CBC    Component Value Date/Time   WBC 6.6 02/02/2020 1519   RBC 4.64 02/02/2020 1519   HGB 13.6 02/02/2020 1519   HGB 14.0 11/12/2011 1136   HCT 39.7 02/02/2020 1519   HCT 40.7 11/12/2011 1136   PLT 202 02/02/2020 1519   PLT 274 11/12/2011 1136   MCV 85.6 02/02/2020 1519   MCV 86 11/12/2011 1136   MCH 29.3 02/02/2020 1519   MCHC 34.3 02/02/2020 1519   RDW 12.6 02/02/2020 1519   RDW 12.8 11/12/2011 1136   LYMPHSABS 1.8 08/14/2017 1950   LYMPHSABS 2.5 11/12/2011 1136   MONOABS 0.6 08/14/2017 1950   MONOABS 0.8 11/12/2011 1136   EOSABS 0.3 08/14/2017 1950   EOSABS 0.6 11/12/2011 1136   BASOSABS 0.0 08/14/2017 1950   BASOSABS 0.1 11/12/2011 1136    BMET    Component Value Date/Time   NA 140 02/11/2020 1449   NA 135 (L) 06/30/2013 1203  K 4.6 02/11/2020 1449   K 4.1 06/30/2013 1203   CL 103 02/11/2020 1449   CL 102 06/30/2013 1203   CO2 28 02/11/2020 1449   CO2 32 06/30/2013 1203   GLUCOSE 96 02/11/2020 1449   GLUCOSE 143 (H) 06/30/2013 1203   BUN 31 (H) 02/11/2020 1449   BUN 19 (H) 06/30/2013 1203   CREATININE 1.43 (H) 02/11/2020 1449   CREATININE 0.89 06/30/2013 1203   CALCIUM  10.1 02/11/2020 1449   CALCIUM 9.7 06/30/2013 1203   GFRNONAA 41 (L) 02/02/2020 1519   GFRNONAA >60 06/30/2013 1203   GFRAA 47 (L) 02/02/2020 1519   GFRAA >60 06/30/2013 1203    BNP    Component Value Date/Time   BNP 35 04/03/2019 1522    ProBNP No results found for: PROBNP  Imaging: CT Head Wo Contrast  Result Date: 02/02/2020 CLINICAL DATA:  Dizziness and multiple episodes of near syncope EXAM: CT HEAD WITHOUT CONTRAST TECHNIQUE: Contiguous axial images were obtained from the base of the skull through the vertex without intravenous contrast. COMPARISON:  CT head 09/12/2015 FINDINGS: Brain: Stable focus of hypoattenuation in the left frontal lobe (3/16) corresponding with a perivascular spaces characterized on prior MR imaging. No evidence of acute infarction, hemorrhage, hydrocephalus, extra-axial collection or mass lesion/mass effect. Symmetric prominence of the ventricles, cisterns and sulci compatible with parenchymal volume loss. Patchy areas of white matter hypoattenuation are most compatible with chronic microvascular angiopathy. Vascular: Atherosclerotic calcification of the carotid siphons. No hyperdense vessel. Skull: No calvarial fracture or suspicious osseous lesion. No scalp swelling or hematoma. Sinuses/Orbits: Chronic left maxillary retention cyst. Remaining paranasal sinuses and mastoids are predominantly clear. Included orbital structures are unremarkable. Other: None IMPRESSION: 1. No acute intracranial abnormality. 2. Stable parenchymal volume loss and chronic microvascular angiopathy. Electronically Signed   By: Lovena Le M.D.   On: 02/02/2020 20:27   DG PAIN CLINIC C-ARM 1-60 MIN NO REPORT  Result Date: 02/23/2020 Fluoro was used, but no Radiologist interpretation will be provided. Please refer to "NOTES" tab for provider progress note.    Assessment & Plan:   OSA (obstructive sleep apnea) - HX sleep apnea, machine stopped working 2 weeks ago. Reports benefit  from CPAP use. She has already contacted [facility] and is being sent a new machine. Patient to resume CPAP once she gets new equipment. Advised her to aim to wear 4-6 hours every night or more. Do not take sedating medication or drink alcohol in excessive prior to bedtime without CPAP as these can worse sleep apnea. Do not drive if experiencing excessive daytime fatigue or somnolence  Orders: - Renew CPAP auto titrate 9-20cm h20; Enroll in [place] with [employer]  Follow-up: - FU in 6-8 week televisit with Beth NP to review CPAP compliance       Martyn Ehrich, NP 02/26/2020

## 2020-02-03 NOTE — Patient Instructions (Addendum)
Pleasure seeing you today Melinda [name]  Recommendations: - Resume CPAP once you get new machine - Aim to wear 4-6 hours every night or more - Do not take sedating medication or drink alcohol in excessive prior to bedtime without CPAP as these can worse sleep apnea - Do not drive if experiencing excessive daytime fatigue or somnolence  Orders: - Renew CPAP auto titrate 9-20cm h20; Enroll in [place] with [employer]  Follow-up: - FU in 6-8 week televisit with Beth NP to review CPAP compliance    CPAP and BPAP Information CPAP and BPAP are methods of helping a person breathe with the use of air pressure. CPAP stands for "continuous positive airway pressure." BPAP stands for "bi-level positive airway pressure." In both methods, air is blown through your nose or mouth and into your air passages to help you breathe well. CPAP and BPAP use different amounts of pressure to blow air. With CPAP, the amount of pressure stays the same while you breathe in and out. With BPAP, the amount of pressure is increased when you breathe in (inhale) so that you can take larger breaths. Your health care provider will recommend whether CPAP or BPAP would be more helpful for you. Why are CPAP and BPAP treatments used? CPAP or BPAP can be helpful if you have:  Sleep apnea.  Chronic obstructive pulmonary disease (COPD).  Heart failure.  Medical conditions that weaken the muscles of the chest including muscular dystrophy, or neurological diseases such as amyotrophic lateral sclerosis (ALS).  Other problems that cause breathing to be weak, abnormal, or difficult. CPAP is most commonly used for obstructive sleep apnea (OSA) to keep the airways from collapsing when the muscles relax during sleep. How is CPAP or BPAP administered? Both CPAP and BPAP are provided by a small machine with a flexible plastic tube that attaches to a plastic mask. You wear the mask. Air is blown through the mask into your nose or mouth. The amount  of pressure that is used to blow the air can be adjusted on the machine. Your health care provider will determine the pressure setting that should be used based on your individual needs. When should CPAP or BPAP be used? In most cases, the mask only needs to be worn during sleep. Generally, the mask needs to be worn throughout the night and during any daytime naps. People with certain medical conditions may also need to wear the mask at other times when they are awake. Follow instructions from your health care provider about when to use the machine. What are some tips for using the mask?   Because the mask needs to be snug, some people feel trapped or closed-in (claustrophobic) when first using the mask. If you feel this way, you may need to get used to the mask. One way to do this is by holding the mask loosely over your nose or mouth and then gradually applying the mask more snugly. You can also gradually increase the amount of time that you use the mask.  Masks are available in various types and sizes. Some fit over your mouth and nose while others fit over just your nose. If your mask does not fit well, talk with your health care provider about getting a different one.  If you are using a mask that fits over your nose and you tend to breathe through your mouth, a chin strap may be applied to help keep your mouth closed.  The CPAP and BPAP machines have alarms that may  sound if the mask comes off or develops a leak.  If you have trouble with the mask, it is very important that you talk with your health care provider about finding a way to make the mask easier to tolerate. Do not stop using the mask. Stopping the use of the mask could have a negative impact on your health. What are some tips for using the machine?  Place your CPAP or BPAP machine on a secure table or stand near an electrical outlet.  Know where the on/off switch is located on the machine.  Follow instructions from your health  care provider about how to set the pressure on your machine and when you should use it.  Do not eat or drink while the CPAP or BPAP machine is on. Food or fluids could get pushed into your lungs by the pressure of the CPAP or BPAP.  Do not smoke. Tobacco smoke residue can damage the machine.  For home use, CPAP and BPAP machines can be rented or purchased through home health care companies. Many different brands of machines are available. Renting a machine before purchasing may help you find out which particular machine works well for you.  Keep the CPAP or BPAP machine and attachments clean. Ask your health care provider for specific instructions. Get help right away if:  You have redness or open areas around your nose or mouth where the mask fits.  You have trouble using the CPAP or BPAP machine.  You cannot tolerate wearing the CPAP or BPAP mask.  You have pain, discomfort, and bloating in your abdomen. Summary  CPAP and BPAP are methods of helping a person breathe with the use of air pressure.  Both CPAP and BPAP are provided by a small machine with a flexible plastic tube that attaches to a plastic mask.  If you have trouble with the mask, it is very important that you talk with your health care provider about finding a way to make the mask easier to tolerate. This information is not intended to replace advice given to you by your health care provider. Make sure you discuss any questions you have with your health care provider. Document Revised: 10/08/2018 Document Reviewed: 05/07/2016 Elsevier Patient Education  [place].

## 2020-02-04 ENCOUNTER — Telehealth: Payer: Self-pay | Admitting: Cardiology

## 2020-02-04 ENCOUNTER — Telehealth: Payer: Self-pay

## 2020-02-04 NOTE — Telephone Encounter (Signed)
I reached out to [facility] for further instructions... She gave me VO to instruct pt to take 5 mg of Midodrine TID from the 2.5 mg she was originally prescribed... pt states the pharmacy did not have 2.5 mg tablet when she went to pick up Rx so they gave her 5 mg tabs... Pt states she will try 5 mg TID and understands precautions when she need to be seen... pt has also reached out to Cardiology and they advised her to f/u in 2 weeks

## 2020-02-04 NOTE — Telephone Encounter (Signed)
Melinda [name] - Client TELEPHONE ADVICE RECORD [facility] Patient Name: Melinda [name] Gender: Female DOB: 1956/01/07 Age: 64 Y 11 M 19 D Return Phone Number: [phone] (Primary) Address: City/State/Zip: [place] [address] Client [place] [name] - Client Client Site [place] [facility] [name] Physician [name] - NP Contact Type Call Who Is Calling Patient / Member / Family / Caregiver Call Type Triage / Clinical Relationship To Patient Self Return Phone Number [phone] (Primary) Chief Complaint Blood Pressure Low Reason for Call Symptomatic / Request for Melinda [name] states her blood pressure has been very low. She has been doing the exercises she was advised to do. Her blood pressure has not risen. Her reading before her exercises was 85/48 latest reading was 113/63. She states she felt as if she was in a trance. She states she called earlier but no one called her back. Translation No Nurse Assessment Nurse: [name], RN, Kim Date/Time (Eastern Time): 02/04/2020 12:33:51 PM Confirm and document reason for call. If symptomatic, describe symptoms. ---Caller states she called a few days ago about her BP running low, felt better yesterday but today she is having BP as low as 80/46 and feels like she "is in a trance". States she did go to the ER as advised 2 days ago, was told she has orthostatic hypotension and was started on midodrine. States she is taking med as rxd and has been drinking much more fluid but her ears "are ringing bad" and she feels "weird" and "weak". Has the patient had close contact with a person known or suspected to have the novel coronavirus illness OR traveled / lives in area with major community spread (including international travel) in the last 14 days from the onset of symptoms? * If Asymptomatic, screen for exposure and travel within the last 14  days. ---No Does the patient have any new or worsening symptoms? ---Yes Will a triage be completed? ---Yes Related visit to physician within the last 2 weeks? ---Yes Does the PT have any chronic conditions? (i.e. diabetes, asthma, this includes High risk factors for pregnancy, etc.) ---Yes List chronic conditions. ---CPS, Spinal stenosis, Chronic fatigue, Fibromyalgia, Cardiac stent, Orthostatic hypotension Is this a behavioral health or substance abuse call? ---No PLEASE NOTE: All timestamps contained within this report are represented as [place] Standard Time. CONFIDENTIALTY NOTICE: This fax transmission is intended only for the addressee. It contains information that is legally privileged, confidential or otherwise protected from use or disclosure. If you are not the intended recipient, you are strictly prohibited from reviewing, disclosing, copying using or disseminating any of this information or taking any action in reliance on or regarding this information. If you have received this fax in error, please notify us immediately by telephone so that we can arrange for its return to [place]. Phone: [phone], Toll-Free: [phone], Fax: [phone] Page: 2 of 2 Call Id: [id number] Guidelines Guideline Title Affirmed Question Affirmed Notes Nurse Date/Time [name] Time) Blood Pressure - Low [2] Fall in systolic BP > 20 mm Hg from normal AND [2] dizzy, lightheaded, or weak [name], RN, Maudie Mercury 02/04/2020 12:39:06 PM Disp. Time [name] Time) Disposition Final User 02/04/2020 12:54:43 PM Go to ED Now (or PCP triage) Yes [name], RN, [name] Disagree/Comply Comply Caller Understands Yes PreDisposition Did not know what to do Care Advice Given Per Guideline GO TO ED NOW (OR PCP TRIAGE): * IF NO PCP (PRIMARY CARE PROVIDER) SECOND-LEVEL TRIAGE: You need to  be seen within the next hour. Go to the [facility] at _____________ [place] as soon as you can. ANOTHER ADULT SHOULD DRIVE: * It is  better and safer if another adult drives instead of you. CARE ADVICE given per Low Blood Pressure (Adult) guideline. Comments User: [name], RN Date/Time [name] Time): 02/04/2020 12:54:29 PM Caller also encouraged to schedule appt with cardiologist for eval and tx of symptoms. Referrals GO TO FACILITY UNDECIDED

## 2020-02-04 NOTE — Telephone Encounter (Signed)
Let's start Florinef 0.1 mg p.o. once a day. Have her come back in 2 weeks to see me or APP with basic metabolic profile. Continue high sodium diet, hydration. Candee Furbish, MD

## 2020-02-04 NOTE — Telephone Encounter (Signed)
Spoke with the pt re: Dr. Marlou Porch recommendation and she reports that her PCP has spoken with her and advised her to increase her Midodrine... she says she will try that first and will make an appt with Dr. Marlou Porch.. 02/25/20.

## 2020-02-04 NOTE — Telephone Encounter (Signed)
Pt called to report that she has been having low BP and dizziness when rising. She was in the ED 02/02/20 and was told that she was dehydrated and received a bolus of fluid and said she felt better. But, she says her BP has been dropping again and her PCP has been treating her with Midodrine and she does not think it is helping.   Her PCP then told her to call her Cardiologist but they were unsure what to do next.  I advised her to continue to drink plenty of fluids and she says that she has been doing that already.   I instructed her to lay down with her feet elevated but to be careful when rising.   There is not any available appts to get the pt in before the end of the month and she was losing patience with me on the phone.   I advised her I will forward to Dr. Marlou Porch for further review and recommendations.   Pt had labs 02/02/20 in the ED.    This morning  91/56 86/46, 80/46, 81/49 after exercising 81/48 100/54, 93/49

## 2020-02-04 NOTE — Telephone Encounter (Addendum)
I spoke with pt; pt said after talking with access nurse pt contacted cardiology instead of going to UC or ED. Pt was seen at ED 02/02/20 for low BP. Melanie CMA will talk with pt at this time.

## 2020-02-04 NOTE — Telephone Encounter (Signed)
Pt called to report she is still having low blood pressure readings... she has appt 8/12 for f/u and BP has been as low as 81/48 when she rechecked BP it was 100/54... she would like to know what to do as her appointment is 1 week away

## 2020-02-04 NOTE — Telephone Encounter (Signed)
Pt c/o BP issue: STAT if pt c/o blurred vision, one-sided weakness or slurred speech  1. What are your last 5 BP readings? This morning  91/56 86/46, 80/46, 81/49 after exercising 81/48 100/54, 93/49  2. Are you having any other symptoms (ex. Dizziness, headache, blurred vision, passed out)? Dizzy and weak, confused when it drops  3. What is your BP issue? Running low    Pt said she was told by her primary doctor to contact er Cardiologist

## 2020-02-05 NOTE — Telephone Encounter (Signed)
noted 

## 2020-02-08 ENCOUNTER — Encounter: Payer: Self-pay | Admitting: Internal Medicine

## 2020-02-08 DIAGNOSIS — I120 Hypertensive chronic kidney disease with stage 5 chronic kidney disease or end stage renal disease: Secondary | ICD-10-CM

## 2020-02-08 DIAGNOSIS — N1831 Chronic kidney disease, stage 3a: Secondary | ICD-10-CM

## 2020-02-08 DIAGNOSIS — N185 Chronic kidney disease, stage 5: Secondary | ICD-10-CM

## 2020-02-08 MED ORDER — MIDODRINE HCL 5 MG PO TABS: 5.0000 mg | ORAL_TABLET | Freq: Three times a day (TID) | ORAL | 2 refills | Status: DC

## 2020-02-09 DIAGNOSIS — T888XXD Other specified complications of surgical and medical care, not elsewhere classified, subsequent encounter: Secondary | ICD-10-CM | POA: Diagnosis not present

## 2020-02-11 ENCOUNTER — Other Ambulatory Visit: Payer: Self-pay

## 2020-02-11 ENCOUNTER — Ambulatory Visit (INDEPENDENT_AMBULATORY_CARE_PROVIDER_SITE_OTHER): Payer: Medicare Other | Admitting: Internal Medicine

## 2020-02-11 ENCOUNTER — Encounter: Payer: Self-pay | Admitting: Internal Medicine

## 2020-02-11 VITALS — BP 126/86 | HR 81 | Temp 97.0°F | Wt 223.0 lb

## 2020-02-11 DIAGNOSIS — I951 Orthostatic hypotension: Secondary | ICD-10-CM | POA: Diagnosis not present

## 2020-02-11 DIAGNOSIS — F419 Anxiety disorder, unspecified: Secondary | ICD-10-CM | POA: Diagnosis not present

## 2020-02-11 DIAGNOSIS — R0789 Other chest pain: Secondary | ICD-10-CM | POA: Diagnosis not present

## 2020-02-11 DIAGNOSIS — M542 Cervicalgia: Secondary | ICD-10-CM

## 2020-02-11 DIAGNOSIS — R7989 Other specified abnormal findings of blood chemistry: Secondary | ICD-10-CM

## 2020-02-11 DIAGNOSIS — M546 Pain in thoracic spine: Secondary | ICD-10-CM | POA: Diagnosis not present

## 2020-02-11 NOTE — Patient Instructions (Signed)

## 2020-02-11 NOTE — Progress Notes (Signed)
HPI  Patient presents the clinic today for ER follow-up.  She went to the ER 8/3 with complaint of near syncope multiple times in 1 day.  She was found to be slightly dehydrated with a elevated creatinine and orthostatic.  CT head was negative for any acute findings.  She was given a 500 NS bolus and started on Midodrine.  She was discharged home and advised to follow-up with her PCP.  Since discharge, she had been having intermittent low blood pressures with some dizziness but no syncopal episodes.  She called the office and PCP advised to increase Midodrine to 5 mg 3 times daily.  She also called cardiology during this time who recommended Florinef, but she decided to stick with the Midodrine first. She is been taking the medication as prescribed. She reports the dizziness has finally improved, but she has noticed, especially today that she doesn't feel right. She reports that she developed substernal chest pain while sitting in the waiting room. She reports associated pressure in her neck. She describes the pain as dull/pressure. The pain radiates through to her back. She reports this is the exact same way she felt when she had her last heart attack. She denies current dizziness, visual changes, syncope, nausea, vomiting or shortness of breath. She denies any recent changes other than increasing the Midodrine. She has Nitro but has not taken it. She does have a follow-up with cardiology scheduled for 02/25/20.   Past Medical History:  Diagnosis Date  . 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 Amputation of second toe, right, traumatic ([place]) 09/2017  . Anemia    years ago  . Anxiety   . Blue toes    2nd toe on right foot, will get appt.  . Bulging disc   . CAD (coronary artery disease) 2009   s/p stent to LAD  . 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  Ablations done.  Now has rare episodes  . Chicken pox   . Chronic fatigue   . Chronic kidney disease     "problem with kidney filtration"  . Coronary arteritis   . Degenerative disc disease   . Depression   . Diabetes mellitus without complication ([name])   . Facet joint disease   . Fibromyalgia   . Heart disease   . Hyperlipidemia   . IBS (irritable bowel syndrome)   . MCL deficiency, knee   . MRSA (methicillin resistant staph aureus) culture positive 2011   GREAT TOE RIGHT FOOT  . Neuropathy 04/18/2010  . Peripheral neuropathy   . Renal insufficiency   . Restless leg syndrome   . Sepsis ([place]) 09/2013  . Sleep apnea 08/17/2003   uses CPAP, sleep study at [facility] (mild to moderate)  . Spinal stenosis     Current Outpatient Medications  Medication Sig Dispense Refill  . ACCU-CHEK AVIVA PLUS test strip USE TO CHECK BLOOD SUGAR ONE TIME A DAY 100 strip 5  . Accu-Chek Softclix Lancets lancets USE TO CHECK BLOOD SUGAR 1 TIME DAILY 100 each 2  . AMBULATORY NON FORMULARY MEDICATION Medication Name: CPAP MASK OF CHOICE FOR HOME DEVICE 1 each 0  . aspirin 81 MG tablet Take 81 mg by mouth daily.    Marland Kitchen buPROPion (WELLBUTRIN XL) 150 MG 24 hr tablet TAKE 1 TABLET BY MOUTH EVERY DAY 90 tablet 2  . Cinnamon 500 MG TABS Take 1,000 mg by mouth daily.    . Coenzyme Q10 (CO  Q 10) 100 MG CAPS Take 100 mg by mouth daily.     Marland Kitchen erythromycin ophthalmic ointment 1 application at bedtime.    . fluticasone (FLONASE) 50 MCG/ACT nasal spray Place 2 sprays into both nostrils daily.    Marland Kitchen gentamicin ointment (GARAMYCIN) 0.1 % APPLY TO NOSE THREE TIMES DAILY    . glucosamine-chondroitin 500-400 MG tablet Take 2 tablets by mouth daily.     . Magnesium 250 MG TABS Take 250 mg by mouth daily.    . midodrine (PROAMATINE) 5 MG tablet Take 1 tablet (5 mg total) by mouth 3 (three) times daily with meals. 90 tablet 2  . Misc Natural Products (TART CHERRY ADVANCED PO) Take 1,200 mg by mouth at bedtime.     . Multiple Vitamin (MULTIVITAMIN) tablet Take 1 tablet by mouth daily.    . naloxone (NARCAN) 2 MG/2ML  injection Inject 1 mL (1 mg total) into the muscle as needed for up to 2 doses (for opioid overdose). Inject content of syringe into thigh muscle. Call 911. 2 Syringe 1  . nitroGLYCERIN (NITROSTAT) 0.4 MG SL tablet Place 1 tablet (0.4 mg total) under the tongue every 5 (five) minutes as needed for chest pain. Please keep upcoming appt in October. Thank you 25 tablet 1  . NONFORMULARY OR COMPOUNDED ITEM 212.8 mcg by Epidural Infusion route. Medtronic Neuromodulation pump Fentanyl 212.47mcg/day via IT pump, Baclofen, bupivicaine    . NONFORMULARY OR COMPOUNDED ITEM 88.77 mcg by Epidural Infusion route Continuous EPIDURAL. Medtronic Neuromodulation pump: Fentanyl, baclofen 88.77, bupivicane    . NONFORMULARY OR COMPOUNDED ITEM 5.918 mg by Epidural Infusion route Continuous EPIDURAL. Medtronic Neuromodulation pump: Fentanyl, Baclofen, Bupivicaine 5.918 mg    . Omega-3 Fatty Acids (FISH OIL) 1000 MG CAPS Take 2,000 mg by mouth 2 (two) times daily.     Marland Kitchen oxyCODONE (OXY IR/ROXICODONE) 5 MG immediate release tablet Take 1 tablet (5 mg total) by mouth every 8 (eight) hours as needed for severe pain. Must last 30 days. 90 tablet 0  . oxyCODONE (OXY IR/ROXICODONE) 5 MG immediate release tablet Take 1 tablet (5 mg total) by mouth every 8 (eight) hours as needed for severe pain. Must last 30 days. 90 tablet 0  . oxyCODONE (OXY IR/ROXICODONE) 5 MG immediate release tablet Take 1 tablet (5 mg total) by mouth every 8 (eight) hours as needed for severe pain. Must last 30 days. 90 tablet 0  . PARoxetine (PAXIL) 20 MG tablet TAKE ONE-HALF OF A TABLET BY MOUTH DAILY IN THE MORNING 90 tablet 0  . polyethylene glycol (MIRALAX / GLYCOLAX) packet Take 17 g by mouth daily.     . pregabalin (LYRICA) 150 MG capsule Take 1 capsule (150 mg total) by mouth 3 (three) times daily. 270 capsule 1  . rosuvastatin (CRESTOR) 20 MG tablet TAKE 1 TABLET BY MOUTH EVERY DAY 90 tablet 3  . tiZANidine (ZANAFLEX) 4 MG tablet Take 1 tablet (4 mg  total) by mouth 3 (three) times daily. 270 tablet 1  . VITAMIN E PO Take 450 Units by mouth daily.      No current facility-administered medications for this visit.    No Known Allergies  Family History  Problem Relation Age of Onset  . Lung cancer Mother   . Diabetes Father   . Heart disease Maternal Grandfather   . Diabetes Paternal Grandmother   . Colon cancer Paternal Grandfather   . Stroke Neg Hx     Social History   Socioeconomic History  .  Marital status: Significant Other    Spouse name: Not on file  . Number of children: Not on file  . Years of education: Not on file  . Highest education level: Not on file  Occupational History  . Not on file  Tobacco Use  . Smoking status: Former Smoker    Packs/day: 1.50    Years: 32.00    Pack years: 48.00    Types: Cigarettes    Quit date: 07/22/2007    Years since quitting: 12.5  . Smokeless tobacco: Never Used  Vaping Use  . Vaping Use: Never used  Substance and Sexual Activity  . Alcohol use: Yes    Alcohol/week: 0.0 standard drinks    Comment: occ - Holidays  . Drug use: Yes    Comment: prescribed pain pump and oxy  . Sexual activity: Yes  Other Topics Concern  . Not on file  Social History Narrative   Lives at home with a partner. Independent at baseline   Social Determinants of Health   Financial Resource Strain:   . Difficulty of Paying Living Expenses:   Food Insecurity:   . Worried About Charity fundraiser in the Last Year:   . Arboriculturist in the Last Year:   Transportation Needs:   . Film/video editor (Medical):   Marland Kitchen Lack of Transportation (Non-Medical):   Physical Activity:   . Days of Exercise per Week:   . Minutes of Exercise per Session:   Stress:   . Feeling of Stress :   Social Connections:   . Frequency of Communication with Friends and Family:   . Frequency of Social Gatherings with Friends and Family:   . Attends Religious Services:   . Active Member of Clubs or  Organizations:   . Attends Archivist Meetings:   Marland Kitchen Marital Status:   Intimate Partner Violence:   . Fear of Current or Ex-Partner:   . Emotionally Abused:   Marland Kitchen Physically Abused:   . Sexually Abused:     ROS:  Constitutional: Denies fever, malaise, fatigue, headache or abrupt weight changes.  HEENT: Pt reports left ear fullness. Denies eye pain, eye redness, ear pain, ringing in the ears, wax buildup, runny nose, nasal congestion, bloody nose, or sore throat. Respiratory: Denies difficulty breathing, shortness of breath, cough or sputum production.   Cardiovascular: Pt reports substernal chest pain radiating into her neck. Denies chest tightness, palpitations or swelling in the hands or feet.  Gastrointestinal: Denies nausea, vomiting, abdominal pain, bloating, constipation, diarrhea or blood in the stool.  Musculoskeletal: Denies generalized weakness, decrease in range of motion, difficulty with gait, muscle pain or joint pain and swelling.  Skin: Denies redness, rashes, lesions or ulcercations.  Neurological: Pt reports intermittent dizziness, none currently. Denies difficulty with memory, difficulty with speech or problems with balance and coordination.    No other specific complaints in a complete review of systems (except as listed in HPI above).  PE:  BP 116/76 (BP Location: Left Arm, Patient Position: Standing, Cuff Size: Large)   Pulse 81   Temp (!) 97 F (36.1 C) (Temporal)   Wt 223 lb (101.2 kg)   LMP  (LMP Unknown)   SpO2 96%   BMI 32.00 kg/m   Wt Readings from Last 3 Encounters:  02/03/20 224 lb 9.6 oz (101.9 kg)  02/02/20 220 lb (99.8 kg)  12/14/19 218 lb (98.9 kg)    General: Appears her stated age, obese, in NAD. HEENT: Head: normal  shape and size; Eyes: sclera white, no icterus, conjunctiva pink, PERRLA and EOMs intact; Ears: Tm's gray and intact, normal light reflex; Neck: Neck supple, trachea midline. No masses, lumps or thyromegaly present.    Cardiovascular: Normal rate and rhythm. S1,S2 noted.  No murmur, rubs or gallops noted. No JVD or BLE edema. No carotid bruits noted. Pulmonary/Chest: Normal effort and positive vesicular breath sounds. No respiratory distress. No wheezes, rales or ronchi noted.  Abdomen: Soft and nontender. Normal bowel sounds. Musculoskeletal:  No difficulty with gait.  Neurological: Alert and oriented. Coordination normal.     BMET    Component Value Date/Time   NA 138 02/02/2020 1519   NA 135 (L) 06/30/2013 1203   K 4.4 02/02/2020 1519   K 4.1 06/30/2013 1203   CL 100 02/02/2020 1519   CL 102 06/30/2013 1203   CO2 27 02/02/2020 1519   CO2 32 06/30/2013 1203   GLUCOSE 100 (H) 02/02/2020 1519   GLUCOSE 143 (H) 06/30/2013 1203   BUN 28 (H) 02/02/2020 1519   BUN 19 (H) 06/30/2013 1203   CREATININE 1.39 (H) 02/02/2020 1519   CREATININE 0.89 06/30/2013 1203   CALCIUM 9.3 02/02/2020 1519   CALCIUM 9.7 06/30/2013 1203   GFRNONAA 41 (L) 02/02/2020 1519   GFRNONAA >60 06/30/2013 1203   GFRAA 47 (L) 02/02/2020 1519   GFRAA >60 06/30/2013 1203    Lipid Panel     Component Value Date/Time   CHOL 102 03/12/2019 1255   TRIG 158.0 (H) 03/12/2019 1255   HDL 34.30 (L) 03/12/2019 1255   CHOLHDL 3 03/12/2019 1255   VLDL 31.6 03/12/2019 1255   LDLCALC 36 03/12/2019 1255    CBC    Component Value Date/Time   WBC 6.6 02/02/2020 1519   RBC 4.64 02/02/2020 1519   HGB 13.6 02/02/2020 1519   HGB 14.0 11/12/2011 1136   HCT 39.7 02/02/2020 1519   HCT 40.7 11/12/2011 1136   PLT 202 02/02/2020 1519   PLT 274 11/12/2011 1136   MCV 85.6 02/02/2020 1519   MCV 86 11/12/2011 1136   MCH 29.3 02/02/2020 1519   MCHC 34.3 02/02/2020 1519   RDW 12.6 02/02/2020 1519   RDW 12.8 11/12/2011 1136   LYMPHSABS 1.8 08/14/2017 1950   LYMPHSABS 2.5 11/12/2011 1136   MONOABS 0.6 08/14/2017 1950   MONOABS 0.8 11/12/2011 1136   EOSABS 0.3 08/14/2017 1950   EOSABS 0.6 11/12/2011 1136   BASOSABS 0.0 08/14/2017 1950    BASOSABS 0.1 11/12/2011 1136    Hgb A1C Lab Results  Component Value Date   HGBA1C 6.2 (H) 02/02/2020     Assessment and Plan:  ER follow-up for Orthostatic Hypotension:  Repeat orthostatics positive but she is less symptomatic Continue Midodrine 5 mg 3 times daily Bmet today She will continue to follow with cardiology as previously planned  Substernal Chest Pain, Thoracic Back Pain, Neck Pain:  Indication for ECG: substernal chest pain Interpretation of ECG: normal rate an rhythm, no ST elevation Comparison of ECG: 02/03/2020, unchanged She took Nitro 4 mg PO x 1- relieved her symptoms Repeat BP 126/86 She feels like and I agree that there may be a component of anxiety   We will follow-up after labs, return/ER precautions discussed [name], NP This visit occurred during the SARS-CoV-2 public health emergency.  Safety protocols were in place, including screening questions prior to the visit, additional usage of staff PPE, and extensive cleaning of exam room while observing appropriate contact time as indicated for  disinfecting solutions.

## 2020-02-12 LAB — BASIC METABOLIC PANEL
BUN: 31 mg/dL — ABNORMAL HIGH (ref 6–23)
CO2: 28 mEq/L (ref 19–32)
Calcium: 10.1 mg/dL (ref 8.4–10.5)
Chloride: 103 mEq/L (ref 96–112)
Creatinine, Ser: 1.43 mg/dL — ABNORMAL HIGH (ref 0.40–1.20)
GFR: 37.13 mL/min — ABNORMAL LOW (ref >60.00)
Glucose, Bld: 96 mg/dL (ref 70–99)
Potassium: 4.6 mEq/L (ref 3.5–5.1)
Sodium: 140 mEq/L (ref 135–145)

## 2020-02-14 ENCOUNTER — Encounter: Payer: Self-pay | Admitting: Internal Medicine

## 2020-02-15 ENCOUNTER — Other Ambulatory Visit: Payer: Self-pay | Admitting: Internal Medicine

## 2020-02-17 NOTE — Addendum Note (Signed)
Addended by: Jearld Fenton on: 02/17/2020 03:52 PM   Modules accepted: Orders

## 2020-02-23 ENCOUNTER — Other Ambulatory Visit: Payer: Self-pay

## 2020-02-23 ENCOUNTER — Ambulatory Visit
Admission: RE | Admit: 2020-02-23 | Discharge: 2020-02-23 | Disposition: A | Discharge status: Home / Self Care | Payer: Medicare Other | Source: Ambulatory Visit | Attending: Pain Medicine | Admitting: Pain Medicine

## 2020-02-23 ENCOUNTER — Encounter: Payer: Self-pay | Admitting: Pain Medicine

## 2020-02-23 ENCOUNTER — Ambulatory Visit (HOSPITAL_BASED_OUTPATIENT_CLINIC_OR_DEPARTMENT_OTHER): Payer: Medicare Other | Admitting: Pain Medicine

## 2020-02-23 ENCOUNTER — Other Ambulatory Visit: Payer: Self-pay | Admitting: Pain Medicine

## 2020-02-23 VITALS — BP 115/80 | HR 85 | Temp 97.3°F | Resp 18 | Ht 70.0 in | Wt 223.0 lb

## 2020-02-23 DIAGNOSIS — G8929 Other chronic pain: Secondary | ICD-10-CM

## 2020-02-23 DIAGNOSIS — G96198 Other disorders of meninges, not elsewhere classified: Secondary | ICD-10-CM | POA: Diagnosis not present

## 2020-02-23 DIAGNOSIS — M5441 Lumbago with sciatica, right side: Secondary | ICD-10-CM | POA: Diagnosis not present

## 2020-02-23 DIAGNOSIS — M5137 Other intervertebral disc degeneration, lumbosacral region: Secondary | ICD-10-CM | POA: Diagnosis not present

## 2020-02-23 DIAGNOSIS — G894 Chronic pain syndrome: Secondary | ICD-10-CM | POA: Insufficient documentation

## 2020-02-23 DIAGNOSIS — M961 Postlaminectomy syndrome, not elsewhere classified: Secondary | ICD-10-CM

## 2020-02-23 DIAGNOSIS — R52 Pain, unspecified: Secondary | ICD-10-CM

## 2020-02-23 MED ORDER — MORPHINE SULFATE ER 15 MG PO TBCR: 15.0000 mg | EXTENDED_RELEASE_TABLET | Freq: Two times a day (BID) | ORAL | 0 refills | Status: DC

## 2020-02-23 MED ORDER — LIDOCAINE HCL 2 % IJ SOLN
INTRAMUSCULAR | Status: AC
  Filled 2020-02-23: qty 20

## 2020-02-23 MED FILL — Medication: INTRATHECAL | Qty: 1 | Status: AC

## 2020-02-23 NOTE — Progress Notes (Signed)
Nursing Pain Medication Assessment:  Safety precautions to be maintained throughout the outpatient stay will include: orient to surroundings, keep bed in low position, maintain call bell within reach at all times, provide assistance with transfer out of bed and ambulation.  Medication Inspection Compliance: Pill count conducted under aseptic conditions, in front of the patient. Neither the pills nor the bottle was removed from the patient's sight at any time. Once count was completed pills were immediately returned to the patient in their original bottle.  Medication: Oxycodone IR Pill/Patch Count: 58 of 90 pills remain Pill/Patch Appearance: Markings consistent with prescribed medication Bottle Appearance: Standard pharmacy container. Clearly labeled. Filled Date: 08 / 10 / 2021 Last Medication intake:  Today

## 2020-02-23 NOTE — H&P (View-Only) (Signed)
PROVIDER NOTE: Information contained herein reflects review and annotations entered in association with encounter. Interpretation of such information and data should be left to medically-trained personnel. Information provided to patient can be located elsewhere in the medical record under "Patient Instructions". Document created using STT-dictation technology, any transcriptional errors that may result from process are unintentional.    Patient: Melinda [name]  Service Category: Procedure  Provider: Gaspar Cola, MD  DOB: 1956-06-07  DOS: 02/23/2020  Location: [facility]  MRN: [id number]  Setting: Ambulatory - outpatient  Referring Provider: Jearld Fenton, NP  Type: Established Patient  Specialty: Interventional Pain Management  PCP: Jearld Fenton, NP   Primary Reason for Visit: Interventional Pain Management Treatment. CC: Back Pain (low)  Procedure:          Intrathecal Drug Delivery System (IDDS):  Type: Reservoir Refill [phone]) No rate change Region: Abdominal Laterality: Right  Type of Pump: Medtronic Synchromed II Delivery Route: Intrathecal Type of Pain Treated: Neuropathic/Nociceptive Primary Medication Class: Opioid/opiate  Medication, Concentration, Infusion Program, & Delivery Rate: Please see scanned programming printout.   Indications: 1. Chronic pain syndrome   2. Failed back surgical syndrome   3. Epidural fibrosis   4. DDD (degenerative disc disease), lumbosacral   5. Chronic low back pain (Bilateral) (L>R) w/ sciatica (Right)    Pain Assessment: Self-Reported Pain Score: 2 /10             Reported level is compatible with observation.        Note: The patient comes into the clinic today indicating that she continues to have more pain and furthermore she has been also experiencing episodes of orthostatic hypotension.  She was questioning whether this had anything to do with the pump.  Initially I explained to her that there was the possibility  that the local anesthetic in the pump could in theory cause a sympathectomy leading to this orthostatic hypotension.  However, the patient is also taking Cymbalta and she was doing her own research and found out that the Cymbalta may be responsible for that hypertension.  In fact, she started weaning herself off of it and has had less episodes.  All of this conversation took place before we actually attempted to access the pump.  When we did, we had difficulty entering the pump and we decided to take her to the fluoroscopy suite to take a look at it since in the past we have had issues with his pump flipping.  She had the pump then by the pain specialist at the [facility] neuro[facility] group.  However, it turns out that since the beginning she has been experiencing problems with the pump moving and later on accumulating fluid around it.  She indicates that 14 days ago she went to the practice and they removed 210 mL of fluid from the space around the pump.  Today, it was obvious that she again had some fluid in it and I went ahead and removed 193 mL of clear transparent yellowish serum from the space around the.  Furthermore, when we took the patient to the fluoroscopy suite we did confirm that the pump had flipped and it is clear that the catheter has twisted to the point where it is now kinked.  I had to manually flipped the pump to access the center port and once I did that, we removed the entire 40 mL that we had originally been in the pump meaning that the pump had not delivered any of  its medicine to the patient.  This answers the question as to whether or not the local anesthetic and the pump was responsible for the orthostatic hypotension.  Clearly was not since she did not get any.  Therefore, it is the Cymbalta that has been responsible for her episodes of orthostatic hypotension.  Today when onto refilling the simply to make sure that the internal components of the device would not be damaged due to the  pump being empty.  However, we do need to have the patient go back to the [facility] neurosurgery practice to see if they can go back in and anchor the pump down, as well as untwist the catheter to unkinck it.  Today we took the opportunity to refill the patient's overall medications but since she is on the oxycodone, which there seems to be a natural shortage of, I will be switching her to MS Contin 15 mg p.o. twice daily.  Pharmacotherapy Assessment  Analgesic: Oxycodone 5 mg, 1 tab PO q 8 hrs (15 mg/day of oxycodone) + Intrathecal PF-Fentanyl MME/day: 22.5 mg/day (oral).   Monitoring: [place] PMP: PDMP reviewed during this encounter.       Pharmacotherapy: No side-effects or adverse reactions reported. Compliance: No problems identified. Effectiveness: Clinically acceptable. Plan: Refer to "POC".  UDS:  Summary  Date Value Ref Range Status  07/24/2018 FINAL  Final    Comment:    ==================================================================== TOXASSURE SELECT 13 (MW) ==================================================================== Test                             Result       Flag       Units Drug Present and Declared for Prescription Verification   Oxycodone                      754          EXPECTED   ng/mg creat   Oxymorphone                    231          EXPECTED   ng/mg creat   Noroxycodone                   2463         EXPECTED   ng/mg creat   Noroxymorphone                 89           EXPECTED   ng/mg creat    Sources of oxycodone are scheduled prescription medications.    Oxymorphone, noroxycodone, and noroxymorphone are expected    metabolites of oxycodone. Oxymorphone is also available as a    scheduled prescription medication. Drug Present not Declared for Prescription Verification   Fentanyl                       4            UNEXPECTED ng/mg creat   Norfentanyl                    29           UNEXPECTED ng/mg creat    Source of fentanyl is a scheduled  prescription medication,    including IV, patch, and transmucosal formulations. Norfentanyl    is an expected metabolite of fentanyl. ==================================================================== Test  Result    Flag   Units      Ref Range   Creatinine              134              mg/dL      >=20 ==================================================================== Declared Medications:  The flagging and interpretation on this report are based on the  following declared medications.  Unexpected results may arise from  inaccuracies in the declared medications.  **Note: The testing scope of this panel includes these medications:  Oxycodone  **Note: The testing scope of this panel does not include following  reported medications:  Aspirin (Aspirin 81)  Bupropion (Wellbutrin)  Chondroitin (Glucosamine-Chondroitin)  Cinnamon  Glipizide (Glucotrol)  Glucosamine (Glucosamine-Chondroitin)  Magnesium  Multivitamin  Naloxone  Nitroglycerin (Nitrostat)  Paroxetine  Polyethylene Glycol (GlycoLAX)  Polyethylene Glycol (MiraLAX)  Pregabalin (Lyrica)  Rosuvastatin (Crestor)  Supplement (Omega-3)  Tizanidine (Zanaflex)  Turmeric  Ubiquinone (Coenzyme Q 10)  Vitamin D2 (Drisdol)  Vitamin E ==================================================================== For clinical consultation, please call [phone]. ====================================================================     Intrathecal Pump Therapy Assessment  Manufacturer: Medtronic Synchromed Type: Programmable Volume: 40 mL reservoir MRI compatibility: Yes   Drug content:  Primary Medication Class:Opioid Primary Medication:PF-Fentanyl(1071mg/mL) Secondary Medication:PF-Bupivacaine(244mmL) Other Medication:PF-Baclofen (24021mmL)   Programming:  Type: Simple continuous. See pump readout for details.   Changes:  Medication Change: None at this point Rate Change: No change in  rate  Reported side-effects or adverse reactions: None reported  Effectiveness: Described as relatively effective, allowing for increase in activities of daily living (ADL) Clinically meaningful improvement in function (CMIF): Sustained CMIF goals met  Plan: Pump refill today  Pre-op Assessment:  Ms. ColCammack a [age]o. (year old), female patient, seen today for interventional treatment. 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 Ms. ColPendells a current medication list which includes the following prescription(s): accu-chek aviva plus, accu-chek softclix lancets, AMBULATORY NON FORMULARY MEDICATION, aspirin, bupropion, cinnamon, co q 10, duloxetine, fluticasone, glucosamine-chondroitin, magnesium, midodrine, misc natural products, multivitamin, naloxone, nitroglycerin, NONFORMULARY OR COMPOUNDED ITEM, fish oil, paroxetine, polyethylene glycol, pregabalin, rosuvastatin, tizanidine, vitamin e, [START ON 03/01/2020] morphine, [START ON 03/31/2020] morphine, [START ON 04/30/2020] morphine, NONFORMULARY OR COMPOUNDED ITEM, and NONFORMULARY OR COMPOUNDED ITEM. Her primarily concern today is the Back Pain (low)  Initial Vital Signs:  Pulse/HCG Rate: 85  Temp: (!) 97.3 F (36.3 C) Resp: 18 BP: 115/80 SpO2: 98  %  BMI: Estimated body mass index is 32 kg/m as calculated from the following:   Height as of this encounter: 5' 10" (1.778 m).   Weight as of this encounter: 223 lb (101.2 kg).  Risk Assessment: Allergies: Reviewed. She has No Known Allergies.  Allergy Precautions: None required Coagulopathies: Reviewed. None identified.  Blood-thinner therapy: None at this time Active Infection(s): Reviewed. None identified. Ms. ColVanalstine afebrile  Site Confirmation: Ms. ColOrlowskis asked to confirm the procedure and laterality before marking the site Procedure checklist: Completed Consent: Before the procedure and  under the influence of no sedative(s), amnesic(s), or anxiolytics, the patient was informed of the treatment options, risks and possible complications. To fulfill our ethical and legal obligations, as recommended by the American Medical Association's Code of Ethics, I have informed the patient of my clinical impression; the nature and purpose of the treatment or procedure; the risks, benefits, and possible complications of the intervention; the alternatives, including doing nothing; the risk(s) and benefit(s) of the alternative treatment(s) or procedure(s); and the risk(s) and benefit(s) of doing nothing.  Ms. Fore was provided with information about the general risks and possible complications associated with most interventional procedures. These include, but are not limited to: failure to achieve desired goals, infection, bleeding, organ or nerve damage, allergic reactions, paralysis, and/or death.  In addition, she was informed of those risks and possible complications associated to this particular procedure, which include, but are not limited to: damage to the implant; failure to decrease pain; local, systemic, or serious CNS infections, intraspinal abscess with possible cord compression and paralysis, or life-threatening such as meningitis; bleeding; organ damage; nerve injury or damage with  subsequent sensory, motor, and/or autonomic system dysfunction, resulting in transient or permanent pain, numbness, and/or weakness of one or several areas of the body; allergic reactions, either minor or major life-threatening, such as anaphylactic or anaphylactoid reactions.  Furthermore, Ms. Bastone was informed of those risks and complications associated with the medications. These include, but are not limited to: allergic reactions (i.e.: anaphylactic or anaphylactoid reactions); endorphine suppression; bradycardia and/or hypotension; water retention and/or peripheral vascular relaxation leading to lower extremity edema and possible stasis ulcers; respiratory depression and/or shortness of breath; decreased metabolic rate leading to weight gain; swelling or edema; medication-induced neural toxicity; particulate matter embolism and blood vessel occlusion with resultant organ, and/or nervous system infarction; and/or intrathecal granuloma formation with possible spinal cord compression and permanent paralysis.  Before refilling the pump Ms. Delauter was informed that some of the medications used in the devise may not be FDA approved for such use and therefore it constitutes an off-label use of the medications.  Finally, she was informed that Medicine is not an exact science; therefore, there is also the possibility of unforeseen or unpredictable risks and/or possible complications that may result in a catastrophic outcome. The patient indicated having understood very clearly. We have given the patient no guarantees and we have made no promises. Enough time was given to the patient to ask questions, all of which were answered to the patient's satisfaction. Ms. Balsley has indicated that she wanted to continue with the procedure. Attestation: I, the ordering provider, attest that I have discussed with the patient the benefits, risks, side-effects, alternatives, likelihood of achieving goals, and potential  problems during recovery for the procedure that I have provided informed consent. Date  Time: 02/23/2020 11:41 AM  Pre-Procedure Preparation:  Monitoring: As per clinic protocol. Respiration, ETCO2, SpO2, BP, heart rate and rhythm monitor placed and checked for adequate function Safety Precautions: Patient was assessed for positional comfort and pressure points before starting the procedure. Time-out: I initiated and conducted the "Time-out" before starting the procedure, as per protocol. The patient was asked to participate by confirming the accuracy of the "Time Out" information. Verification of the correct person, site, and procedure were performed and confirmed by me, the nursing staff, and the patient. "Time-out" conducted as per Joint Commission's Universal Protocol (UP.01.01.01). Time: 1200  Description of Procedure:          Position: Supine Target Area:  Central-port of intrathecal pump. Approach: Anterior, 90 degree angle approach. Area Prepped: Entire Area around the pump implant. DuraPrep (Iodine Povacrylex [0.7% available iodine] and Isopropyl Alcohol, 74% w/w) Safety Precautions: Aspiration looking for blood return was conducted prior to all injections. At no point did we inject any substances, as a needle was being advanced. No attempts were made at seeking any paresthesias. Safe injection practices and needle disposal techniques used. Medications properly checked for expiration dates. SDV (single dose vial) medications used. Description of the Procedure: Protocol guidelines were followed. Two nurses trained to do implant refills were present during the entire procedure. The refill medication was checked by both healthcare providers as well as the patient. The patient was included in the "Time-out" to verify the medication. The patient was placed in position. The pump was identified. The area was prepped in the usual manner. The sterile template was positioned over the pump, making sure  the side-port location matched that of the pump. Both, the pump and the template were held for stability. The needle provided in the Medtronic Kit was then introduced thru the center of the template and into the central port. The pump content was aspirated and discarded volume documented. The new medication was slowly infused into the pump, thru the filter, making sure to avoid overpressure of the device. The needle was then removed and the area cleansed, making sure to leave some of the prepping solution back to take advantage of its long term bactericidal properties. The pump was interrogated and programmed to reflect the correct medication, volume, and dosage. The program was printed and taken to the physician for approval. Once checked and signed by the physician, a copy was provided to the patient and another scanned into the EMR. Vitals:   02/23/20 1140 02/23/20 1141  BP:  115/80  Pulse:  85  Resp:  18  Temp:  (!) 97.3 F (36.3 C)  SpO2:  98%  Weight: 223 lb (101.2 kg)   Height: 5' 10" (1.778 m)     Start Time: 1209 hrs. End Time:   hrs. Materials & Medications: Medtronic Refill Kit Medication(s): Please see chart orders for details.  Imaging Guidance:          Type of Imaging Technique: Fluoroscopy Guidance (Non-spinal) Indication(s): Assistance in needle guidance and placement for procedures requiring needle placement in or near specific anatomical locations not easily accessible without such assistance. Exposure Time: Please see nurses notes. Contrast: None used. Fluoroscopic Guidance: I was personally present during the use of fluoroscopy. "Tunnel Vision Technique" used to obtain the best possible view of the target area. Parallax error corrected before commencing the procedure. "Direction-depth-direction" technique used to introduce the needle under continuous pulsed fluoroscopy. Once target was reached, antero-posterior, oblique, and lateral fluoroscopic projection used confirm  needle placement in all planes. Images permanently stored in EMR. Ultrasound Guidance: N/A                Interpretation: No contrast injected. I personally interpreted the imaging intraoperatively. Adequate needle placement confirmed in multiple planes. Permanent images saved into the patient's record. Pump was found to be up-side-down. Pump was flipped and accessed. A total of 192 mL of fluid was removed from the pump surroundings. The catheter was observed to be kincked.  Antibiotic Prophylaxis:   Anti-infectives (From admission, onward)   None     Indication(s): None identified  Post-operative Assessment:  Post-procedure Vital Signs:  Pulse/HCG Rate: 85  Temp: (!) 97.3 F (36.3 C) Resp: 18  BP: 115/80 SpO2: 98 %  EBL: None  Complications: No immediate post-treatment complications observed by team, or reported by patient.  Note: The patient tolerated the entire procedure well. A repeat set of vitals were taken after the procedure and the patient was kept under observation following institutional policy, for this type of procedure. Post-procedural neurological assessment was performed, showing return to baseline, prior to discharge. The patient was provided with post-procedure discharge instructions, including a section on how to identify potential problems. Should any problems arise concerning this procedure, the patient was given instructions to immediately contact us, at any time, without hesitation. In any case, we plan to contact the patient by telephone for a follow-up status report regarding this interventional procedure.  Comments:  No additional relevant information.  Plan of Care  Orders:  Orders Placed This Encounter  Procedures  . PUMP REFILL    Maintain Protocol by having two(2) healthcare providers during procedure and programming.    Scheduling Instructions:     Please refill intrathecal pump today.    Order Specific Question:   Where will this procedure be  performed?    Answer:   [facility]  . PUMP REFILL    Whenever possible schedule on a procedure today.    Standing Status:   Future    Standing Expiration Date:   07/22/2020    Scheduling Instructions:     Please schedule intrathecal pump refill based on pump programming. Avoid schedule intervals of more than 120 days (4 months).    Order Specific Question:   Where will this procedure be performed?    Answer:   [facility]  . Informed Consent Details: Physician/Practitioner Attestation; Transcribe to consent form and obtain patient signature    Provider Attestation: I, Broadway Dossie Arbour, MD, (Pain Management Specialist), the physician/practitioner, attest that I have discussed with the patient the benefits, risks, side effects, alternatives, likelihood of achieving goals and potential problems during recovery for the procedure that I have provided informed consent.    Scheduling Instructions:     Procedure: Intrathecal Pump Refill     Attending Physician: Beatriz Chancellor A. Dossie Arbour, MD     Indications: Chronic Pain Syndrome (G89.4)     Transcribe to consent form and obtain patient signature.   Chronic Opioid Analgesic:  Oxycodone 5 mg, 1 tab PO q 8 hrs (15 mg/day of oxycodone) + Intrathecal PF-Fentanyl MME/day: 22.5 mg/day (oral).   Medications ordered for procedure: Meds ordered this encounter  Medications  . morphine (MS CONTIN) 15 MG 12 hr tablet    Sig: Take 1 tablet (15 mg total) by mouth every 12 (twelve) hours. Must last 30 days. Do not break tablet    Dispense:  60 tablet    Refill:  0    Chronic Pain: STOP Act (Not applicable) Fill 1 day early if closed on refill date. Do not fill until: 03/01/2020. To last until: 03/31/2020. Avoid benzodiazepines within 8 hours of opioids  . morphine (MS CONTIN) 15 MG 12 hr tablet    Sig: Take 1 tablet (15 mg total) by mouth every 12 (twelve) hours. Must last 30 days. Do not break tablet    Dispense:  60 tablet    Refill:  0     Chronic Pain: STOP Act (Not applicable) Fill 1 day early if closed on refill date. Do not fill until: 03/31/2020. To last until: 04/30/2020. Avoid benzodiazepines within 8 hours of opioids  . morphine (MS CONTIN) 15 MG 12 hr tablet  Sig: Take 1 tablet (15 mg total) by mouth every 12 (twelve) hours. Must last 30 days. Do not break tablet    Dispense:  60 tablet    Refill:  0    Chronic Pain: STOP Act (Not applicable) Fill 1 day early if closed on refill date. Do not fill until: 04/30/2020. To last until: 05/30/2020. Avoid benzodiazepines within 8 hours of opioids   Medications administered: Melinda [name] had no medications administered during this visit.  See the medical record for exact dosing, route, and time of administration.  Follow-up plan:   Return for Pump Refill (Max:4mo.       Interventional treatment options:  Under consideration: Possible left lumbar facet RFA  Possible right lumbar facet RFA  Diagnostic caudal ESI + diagnostic epidurogram Possible Racz procedure Diagnostic bilateral IA knee injection (Steroid) Diagnostic bilateral genicular NB Possible bilateral genicular nerve RFA Possible bilateral Hyalgan knee injection DiagnosticcervicalESI Diagnostic bilateral cervical facet block Possible bilateral cervical facet RFA   Therapeutic/palliative (PRN): Palliative/therapeutic intrathecal pump management (refills/programming adjustments)  Palliative left L2-3 LESI #2 Palliative right lumbar facet block #4  Diagnostic left lumbar facet block #2        Recent Visits Date Type Provider Dept  12/09/19 Office Visit NMilinda Pointer MD [facility]  11/26/19 Procedure visit NMilinda Pointer MD [facility]  Showing recent visits within past 90 days and meeting all other requirements Today's Visits Date Type Provider Dept  02/23/20 Procedure visit NMilinda Pointer MD [facility]  Showing today's visits and meeting  all other requirements Future Appointments No visits were found meeting these conditions. Showing future appointments within next 90 days and meeting all other requirements  Disposition: Discharge home  Discharge (Date  Time): 02/23/2020; 1330 hrs.   Primary Care Physician: BJearld Fenton NP [facility] Note by: FGaspar Cola MD Date: 02/23/2020; Time: 3:44 PM  Disclaimer:  Medicine is not an eChief Strategy Officer The only guarantee in medicine is that nothing is guaranteed. It is important to note that the decision to proceed with this intervention was based on the information collected from the patient. The Data and conclusions were drawn from the patient's questionnaire, the interview, and the physical examination. Because the information was provided in large part by the patient, it cannot be guaranteed that it has not been purposely or unconsciously manipulated. Every effort has been made to obtain as much relevant data as possible for this evaluation. It is important to note that the conclusions that lead to this procedure are derived in large part from the available data. Always take into account that the treatment will also be dependent on availability of resources and existing treatment guidelines, considered by other Pain Management Practitioners as being common knowledge and practice, at the time of the intervention. For Medico-Legal purposes, it is also important to point out that variation in procedural techniques and pharmacological choices are the acceptable norm. The indications, contraindications, technique, and results of the above procedure should only be interpreted and judged by a Board-Certified Interventional Pain Specialist with extensive familiarity and expertise in the same exact procedure and technique.

## 2020-02-23 NOTE — Progress Notes (Signed)
PROVIDER NOTE: Information contained herein reflects review and annotations entered in association with encounter. Interpretation of such information and data should be left to medically-trained personnel. Information provided to patient can be located elsewhere in the medical record under "Patient Instructions". Document created using STT-dictation technology, any transcriptional errors that may result from process are unintentional.    Patient: Melinda [name]  Service Category: Procedure  Provider: Gaspar Cola, MD  DOB: 12-Nov-1955  DOS: 02/23/2020  Location: [facility]  MRN: [id number]  Setting: Ambulatory - outpatient  Referring Provider: Jearld Fenton, NP  Type: Established Patient  Specialty: Interventional Pain Management  PCP: Jearld Fenton, NP   Primary Reason for Visit: Interventional Pain Management Treatment. CC: Back Pain (low)  Procedure:          Intrathecal Drug Delivery System (IDDS):  Type: Reservoir Refill [phone]) No rate change Region: Abdominal Laterality: Right  Type of Pump: Medtronic Synchromed II Delivery Route: Intrathecal Type of Pain Treated: Neuropathic/Nociceptive Primary Medication Class: Opioid/opiate  Medication, Concentration, Infusion Program, & Delivery Rate: Please see scanned programming printout.   Indications: 1. Chronic pain syndrome   2. Failed back surgical syndrome   3. Epidural fibrosis   4. DDD (degenerative disc disease), lumbosacral   5. Chronic low back pain (Bilateral) (L>R) w/ sciatica (Right)    Pain Assessment: Self-Reported Pain Score: 2 /10             Reported level is compatible with observation.        Note: The patient comes into the clinic today indicating that she continues to have more pain and furthermore she has been also experiencing episodes of orthostatic hypotension.  She was questioning whether this had anything to do with the pump.  Initially I explained to her that there was the possibility  that the local anesthetic in the pump could in theory cause a sympathectomy leading to this orthostatic hypotension.  However, the patient is also taking Cymbalta and she was doing her own research and found out that the Cymbalta may be responsible for that hypertension.  In fact, she started weaning herself off of it and has had less episodes.  All of this conversation took place before we actually attempted to access the pump.  When we did, we had difficulty entering the pump and we decided to take her to the fluoroscopy suite to take a look at it since in the past we have had issues with his pump flipping.  She had the pump then by the pain specialist at the [facility] neurosurgery group.  However, it turns out that since the beginning she has been experiencing problems with the pump moving and later on accumulating fluid around it.  She indicates that 14 days ago she went to the practice and they removed 210 mL of fluid from the space around the pump.  Today, it was obvious that she again had some fluid in it and I went ahead and removed 193 mL of clear transparent yellowish serum from the space around the.  Furthermore, when we took the patient to the fluoroscopy suite we did confirm that the pump had flipped and it is clear that the catheter has twisted to the point where it is now kinked.  I had to manually flipped the pump to access the center port and once I did that, we removed the entire 40 mL that we had originally been in the pump meaning that the pump had not delivered any of  its medicine to the patient.  This answers the question as to whether or not the local anesthetic and the pump was responsible for the orthostatic hypotension.  Clearly was not since she did not get any.  Therefore, it is the Cymbalta that has been responsible for her episodes of orthostatic hypotension.  Today when onto refilling the simply to make sure that the internal components of the device would not be damaged due to the  pump being empty.  However, we do need to have the patient go back to the [facility] neurosurgery practice to see if they can go back in and anchor the pump down, as well as untwist the catheter to unkinck it.  Today we took the opportunity to refill the patient's overall medications but since she is on the oxycodone, which there seems to be a natural shortage of, I will be switching her to MS Contin 15 mg p.o. twice daily.  Pharmacotherapy Assessment  Analgesic: Oxycodone 5 mg, 1 tab PO q 8 hrs (15 mg/day of oxycodone) + Intrathecal PF-Fentanyl MME/day: 22.5 mg/day (oral).   Monitoring: [place] PMP: PDMP reviewed during this encounter.       Pharmacotherapy: No side-effects or adverse reactions reported. Compliance: No problems identified. Effectiveness: Clinically acceptable. Plan: Refer to "POC".  UDS:  Summary  Date Value Ref Range Status  07/24/2018 FINAL  Final    Comment:    ==================================================================== TOXASSURE SELECT 13 (MW) ==================================================================== Test                             Result       Flag       Units Drug Present and Declared for Prescription Verification   Oxycodone                      754          EXPECTED   ng/mg creat   Oxymorphone                    231          EXPECTED   ng/mg creat   Noroxycodone                   2463         EXPECTED   ng/mg creat   Noroxymorphone                 89           EXPECTED   ng/mg creat    Sources of oxycodone are scheduled prescription medications.    Oxymorphone, noroxycodone, and noroxymorphone are expected    metabolites of oxycodone. Oxymorphone is also available as a    scheduled prescription medication. Drug Present not Declared for Prescription Verification   Fentanyl                       4            UNEXPECTED ng/mg creat   Norfentanyl                    29           UNEXPECTED ng/mg creat    Source of fentanyl is a scheduled  prescription medication,    including IV, patch, and transmucosal formulations. Norfentanyl    is an expected metabolite of fentanyl. ==================================================================== Test  Result    Flag   Units      Ref Range   Creatinine              134              mg/dL      >=20 ==================================================================== Declared Medications:  The flagging and interpretation on this report are based on the  following declared medications.  Unexpected results may arise from  inaccuracies in the declared medications.  **Note: The testing scope of this panel includes these medications:  Oxycodone  **Note: The testing scope of this panel does not include following  reported medications:  Aspirin (Aspirin 81)  Bupropion (Wellbutrin)  Chondroitin (Glucosamine-Chondroitin)  Cinnamon  Glipizide (Glucotrol)  Glucosamine (Glucosamine-Chondroitin)  Magnesium  Multivitamin  Naloxone  Nitroglycerin (Nitrostat)  Paroxetine  Polyethylene Glycol (GlycoLAX)  Polyethylene Glycol (MiraLAX)  Pregabalin (Lyrica)  Rosuvastatin (Crestor)  Supplement (Omega-3)  Tizanidine (Zanaflex)  Turmeric  Ubiquinone (Coenzyme Q 10)  Vitamin D2 (Drisdol)  Vitamin E ==================================================================== For clinical consultation, please call [phone]. ====================================================================     Intrathecal Pump Therapy Assessment  Manufacturer: Medtronic Synchromed Type: Programmable Volume: 40 mL reservoir MRI compatibility: Yes   Drug content:  Primary Medication Class:Opioid Primary Medication:PF-Fentanyl(1000mcg/mL) Secondary Medication:PF-Bupivacaine(20mg/mL) Other Medication:PF-Baclofen (240mcg/mL)   Programming:  Type: Simple continuous. See pump readout for details.   Changes:  Medication Change: None at this point Rate Change: No change in  rate  Reported side-effects or adverse reactions: None reported  Effectiveness: Described as relatively effective, allowing for increase in activities of daily living (ADL) Clinically meaningful improvement in function (CMIF): Sustained CMIF goals met  Plan: Pump refill today  Pre-op Assessment:  Ms. Stillman is a 64 y.o. (year old), female patient, seen today for interventional treatment. 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 Ms. Hayman has a current medication list which includes the following prescription(s): accu-chek aviva plus, accu-chek softclix lancets, AMBULATORY NON FORMULARY MEDICATION, aspirin, bupropion, cinnamon, co q 10, duloxetine, fluticasone, glucosamine-chondroitin, magnesium, midodrine, misc natural products, multivitamin, naloxone, nitroglycerin, NONFORMULARY OR COMPOUNDED ITEM, fish oil, paroxetine, polyethylene glycol, pregabalin, rosuvastatin, tizanidine, vitamin e, [START ON 03/01/2020] morphine, [START ON 03/31/2020] morphine, [START ON 04/30/2020] morphine, NONFORMULARY OR COMPOUNDED ITEM, and NONFORMULARY OR COMPOUNDED ITEM. Her primarily concern today is the Back Pain (low)  Initial Vital Signs:  Pulse/HCG Rate: 85  Temp: (!) 97.3 F (36.3 C) Resp: 18 BP: 115/80 SpO2: 98  %  BMI: Estimated body mass index is 32 kg/m as calculated from the following:   Height as of this encounter: 5' 10" (1.778 m).   Weight as of this encounter: 223 lb (101.2 kg).  Risk Assessment: Allergies: Reviewed. She has No Known Allergies.  Allergy Precautions: None required Coagulopathies: Reviewed. None identified.  Blood-thinner therapy: None at this time Active Infection(s): Reviewed. None identified. Ms. Quesnel is afebrile  Site Confirmation: Ms. Deguia was asked to confirm the procedure and laterality before marking the site Procedure checklist: Completed Consent: Before the procedure and   under the influence of no sedative(s), amnesic(s), or anxiolytics, the patient was informed of the treatment options, risks and possible complications. To fulfill our ethical and legal obligations, as recommended by the American Medical Association's Code of Ethics, I have informed the patient of my clinical impression; the nature and purpose of the treatment or procedure; the risks, benefits, and possible complications of the intervention; the alternatives, including doing nothing; the risk(s) and benefit(s) of the alternative treatment(s) or procedure(s); and the risk(s) and benefit(s) of doing nothing.  Ms. Broers was provided with information about the general risks and possible complications associated with most interventional procedures. These include, but are not limited to: failure to achieve desired goals, infection, bleeding, organ or nerve damage, allergic reactions, paralysis, and/or death.  In addition, she was informed of those risks and possible complications associated to this particular procedure, which include, but are not limited to: damage to the implant; failure to decrease pain; local, systemic, or serious CNS infections, intraspinal abscess with possible cord compression and paralysis, or life-threatening such as meningitis; bleeding; organ damage; nerve injury or damage with  subsequent sensory, motor, and/or autonomic system dysfunction, resulting in transient or permanent pain, numbness, and/or weakness of one or several areas of the body; allergic reactions, either minor or major life-threatening, such as anaphylactic or anaphylactoid reactions.  Furthermore, Ms. Robideau was informed of those risks and complications associated with the medications. These include, but are not limited to: allergic reactions (i.e.: anaphylactic or anaphylactoid reactions); endorphine suppression; bradycardia and/or hypotension; water retention and/or peripheral vascular relaxation leading to lower extremity edema and possible stasis ulcers; respiratory depression and/or shortness of breath; decreased metabolic rate leading to weight gain; swelling or edema; medication-induced neural toxicity; particulate matter embolism and blood vessel occlusion with resultant organ, and/or nervous system infarction; and/or intrathecal granuloma formation with possible spinal cord compression and permanent paralysis.  Before refilling the pump Ms. Goga was informed that some of the medications used in the devise may not be FDA approved for such use and therefore it constitutes an off-label use of the medications.  Finally, she was informed that Medicine is not an exact science; therefore, there is also the possibility of unforeseen or unpredictable risks and/or possible complications that may result in a catastrophic outcome. The patient indicated having understood very clearly. We have given the patient no guarantees and we have made no promises. Enough time was given to the patient to ask questions, all of which were answered to the patient's satisfaction. Ms. Elizardo has indicated that she wanted to continue with the procedure. Attestation: I, the ordering provider, attest that I have discussed with the patient the benefits, risks, side-effects, alternatives, likelihood of achieving goals, and potential  problems during recovery for the procedure that I have provided informed consent. Date  Time: 02/23/2020 11:41 AM  Pre-Procedure Preparation:  Monitoring: As per clinic protocol. Respiration, ETCO2, SpO2, BP, heart rate and rhythm monitor placed and checked for adequate function Safety Precautions: Patient was assessed for positional comfort and pressure points before starting the procedure. Time-out: I initiated and conducted the "Time-out" before starting the procedure, as per protocol. The patient was asked to participate by confirming the accuracy of the "Time Out" information. Verification of the correct person, site, and procedure were performed and confirmed by me, the nursing staff, and the patient. "Time-out" conducted as per Joint Commission's Universal Protocol (UP.01.01.01). Time: 1200  Description of Procedure:          Position: Supine Target Area:  Central-port of intrathecal pump. Approach: Anterior, 90 degree angle approach. Area Prepped: Entire Area around the pump implant. DuraPrep (Iodine Povacrylex [0.7% available iodine] and Isopropyl Alcohol, 74% w/w) Safety Precautions: Aspiration looking for blood return was conducted prior to all injections. At no point did we inject any substances, as a needle was being advanced. No attempts were made at seeking any paresthesias. Safe injection practices and needle disposal techniques used. Medications properly checked for expiration dates. SDV (single dose vial) medications used. Description of the Procedure: Protocol guidelines were followed. Two nurses trained to do implant refills were present during the entire procedure. The refill medication was checked by both healthcare providers as well as the patient. The patient was included in the "Time-out" to verify the medication. The patient was placed in position. The pump was identified. The area was prepped in the usual manner. The sterile template was positioned over the pump, making sure  the side-port location matched that of the pump. Both, the pump and the template were held for stability. The needle provided in the Medtronic Kit was then introduced thru the center of the template and into the central port. The pump content was aspirated and discarded volume documented. The new medication was slowly infused into the pump, thru the filter, making sure to avoid overpressure of the device. The needle was then removed and the area cleansed, making sure to leave some of the prepping solution back to take advantage of its long term bactericidal properties. The pump was interrogated and programmed to reflect the correct medication, volume, and dosage. The program was printed and taken to the physician for approval. Once checked and signed by the physician, a copy was provided to the patient and another scanned into the EMR. Vitals:   02/23/20 1140 02/23/20 1141  BP:  115/80  Pulse:  85  Resp:  18  Temp:  (!) 97.3 F (36.3 C)  SpO2:  98%  Weight: 223 lb (101.2 kg)   Height: 5' 10" (1.778 m)     Start Time: 1209 hrs. End Time:   hrs. Materials & Medications: Medtronic Refill Kit Medication(s): Please see chart orders for details.  Imaging Guidance:          Type of Imaging Technique: Fluoroscopy Guidance (Non-spinal) Indication(s): Assistance in needle guidance and placement for procedures requiring needle placement in or near specific anatomical locations not easily accessible without such assistance. Exposure Time: Please see nurses notes. Contrast: None used. Fluoroscopic Guidance: I was personally present during the use of fluoroscopy. "Tunnel Vision Technique" used to obtain the best possible view of the target area. Parallax error corrected before commencing the procedure. "Direction-depth-direction" technique used to introduce the needle under continuous pulsed fluoroscopy. Once target was reached, antero-posterior, oblique, and lateral fluoroscopic projection used confirm  needle placement in all planes. Images permanently stored in EMR. Ultrasound Guidance: N/A                Interpretation: No contrast injected. I personally interpreted the imaging intraoperatively. Adequate needle placement confirmed in multiple planes. Permanent images saved into the patient's record. Pump was found to be up-side-down. Pump was flipped and accessed. A total of 192 mL of fluid was removed from the pump surroundings. The catheter was observed to be kincked.  Antibiotic Prophylaxis:   Anti-infectives (From admission, onward)   None     Indication(s): None identified  Post-operative Assessment:  Post-procedure Vital Signs:  Pulse/HCG Rate: 85  Temp: (!) 97.3 F (36.3 C) Resp: 18   BP: 115/80 SpO2: 98 %  EBL: None  Complications: No immediate post-treatment complications observed by team, or reported by patient.  Note: The patient tolerated the entire procedure well. A repeat set of vitals were taken after the procedure and the patient was kept under observation following institutional policy, for this type of procedure. Post-procedural neurological assessment was performed, showing return to baseline, prior to discharge. The patient was provided with post-procedure discharge instructions, including a section on how to identify potential problems. Should any problems arise concerning this procedure, the patient was given instructions to immediately contact us, at any time, without hesitation. In any case, we plan to contact the patient by telephone for a follow-up status report regarding this interventional procedure.  Comments:  No additional relevant information.  Plan of Care  Orders:  Orders Placed This Encounter  Procedures  . PUMP REFILL    Maintain Protocol by having two(2) healthcare providers during procedure and programming.    Scheduling Instructions:     Please refill intrathecal pump today.    Order Specific Question:   Where will this procedure be  performed?    Answer:   [facility]  . PUMP REFILL    Whenever possible schedule on a procedure today.    Standing Status:   Future    Standing Expiration Date:   07/22/2020    Scheduling Instructions:     Please schedule intrathecal pump refill based on pump programming. Avoid schedule intervals of more than 120 days (4 months).    Order Specific Question:   Where will this procedure be performed?    Answer:   [facility]  . Informed Consent Details: Physician/Practitioner Attestation; Transcribe to consent form and obtain patient signature    Provider Attestation: I, Tida Saner A. Ameriah Lint, MD, (Pain Management Specialist), the physician/practitioner, attest that I have discussed with the patient the benefits, risks, side effects, alternatives, likelihood of achieving goals and potential problems during recovery for the procedure that I have provided informed consent.    Scheduling Instructions:     Procedure: Intrathecal Pump Refill     Attending Physician: Segundo Makela A. Aldridge Krzyzanowski, MD     Indications: Chronic Pain Syndrome (G89.4)     Transcribe to consent form and obtain patient signature.   Chronic Opioid Analgesic:  Oxycodone 5 mg, 1 tab PO q 8 hrs (15 mg/day of oxycodone) + Intrathecal PF-Fentanyl MME/day: 22.5 mg/day (oral).   Medications ordered for procedure: Meds ordered this encounter  Medications  . morphine (MS CONTIN) 15 MG 12 hr tablet    Sig: Take 1 tablet (15 mg total) by mouth every 12 (twelve) hours. Must last 30 days. Do not break tablet    Dispense:  60 tablet    Refill:  0    Chronic Pain: STOP Act (Not applicable) Fill 1 day early if closed on refill date. Do not fill until: 03/01/2020. To last until: 03/31/2020. Avoid benzodiazepines within 8 hours of opioids  . morphine (MS CONTIN) 15 MG 12 hr tablet    Sig: Take 1 tablet (15 mg total) by mouth every 12 (twelve) hours. Must last 30 days. Do not break tablet    Dispense:  60 tablet    Refill:  0     Chronic Pain: STOP Act (Not applicable) Fill 1 day early if closed on refill date. Do not fill until: 03/31/2020. To last until: 04/30/2020. Avoid benzodiazepines within 8 hours of opioids  . morphine (MS CONTIN) 15 MG 12 hr tablet      Sig: Take 1 tablet (15 mg total) by mouth every 12 (twelve) hours. Must last 30 days. Do not break tablet    Dispense:  60 tablet    Refill:  0    Chronic Pain: STOP Act (Not applicable) Fill 1 day early if closed on refill date. Do not fill until: 04/30/2020. To last until: 05/30/2020. Avoid benzodiazepines within 8 hours of opioids   Medications administered: Melinda [name] had no medications administered during this visit.  See the medical record for exact dosing, route, and time of administration.  Follow-up plan:   Return for Pump Refill (Max:4mo.       Interventional treatment options:  Under consideration: Possible left lumbar facet RFA  Possible right lumbar facet RFA  Diagnostic caudal ESI + diagnostic epidurogram Possible Racz procedure Diagnostic bilateral IA knee injection (Steroid) Diagnostic bilateral genicular NB Possible bilateral genicular nerve RFA Possible bilateral Hyalgan knee injection DiagnosticcervicalESI Diagnostic bilateral cervical facet block Possible bilateral cervical facet RFA   Therapeutic/palliative (PRN): Palliative/therapeutic intrathecal pump management (refills/programming adjustments)  Palliative left L2-3 LESI #2 Palliative right lumbar facet block #4  Diagnostic left lumbar facet block #2        Recent Visits Date Type Provider Dept  12/09/19 Office Visit NMilinda Pointer MD [facility]  11/26/19 Procedure visit NMilinda Pointer MD [facility]  Showing recent visits within past 90 days and meeting all other requirements Today's Visits Date Type Provider Dept  02/23/20 Procedure visit NMilinda Pointer MD [facility]  Showing today's visits and meeting  all other requirements Future Appointments No visits were found meeting these conditions. Showing future appointments within next 90 days and meeting all other requirements  Disposition: Discharge home  Discharge (Date  Time): 02/23/2020; 1330 hrs.   Primary Care Physician: BJearld Fenton NP [facility] Note by: FGaspar Cola MD Date: 02/23/2020; Time: 3:44 PM  Disclaimer:  Medicine is not an eChief Strategy Officer The only guarantee in medicine is that nothing is guaranteed. It is important to note that the decision to proceed with this intervention was based on the information collected from the patient. The Data and conclusions were drawn from the patient's questionnaire, the interview, and the physical examination. Because the information was provided in large part by the patient, it cannot be guaranteed that it has not been purposely or unconsciously manipulated. Every effort has been made to obtain as much relevant data as possible for this evaluation. It is important to note that the conclusions that lead to this procedure are derived in large part from the available data. Always take into account that the treatment will also be dependent on availability of resources and existing treatment guidelines, considered by other Pain Management Practitioners as being common knowledge and practice, at the time of the intervention. For Medico-Legal purposes, it is also important to point out that variation in procedural techniques and pharmacological choices are the acceptable norm. The indications, contraindications, technique, and results of the above procedure should only be interpreted and judged by a Board-Certified Interventional Pain Specialist with extensive familiarity and expertise in the same exact procedure and technique.

## 2020-02-24 ENCOUNTER — Telehealth: Payer: Self-pay

## 2020-02-24 NOTE — Telephone Encounter (Signed)
Dr. Dossie Arbour is going to do her surgery. Please see Jocelyn Lamer for details.

## 2020-02-24 NOTE — Telephone Encounter (Signed)
Post procedure phone call.  LM 

## 2020-02-24 NOTE — Telephone Encounter (Signed)
Message sent to [name] via "bubble".

## 2020-02-24 NOTE — Telephone Encounter (Signed)
She needs Dr. Dossie Arbour to refer to a doctor that can take care of the kink in her pump. Dr.Eichman cannot do it so she needs a new doctor that can do the repair plus drain when needed. Need this asap because she is not getting any pain medicine due to the kink in the pump.

## 2020-02-25 ENCOUNTER — Ambulatory Visit (INDEPENDENT_AMBULATORY_CARE_PROVIDER_SITE_OTHER): Payer: Medicare Other | Admitting: Cardiology

## 2020-02-25 ENCOUNTER — Other Ambulatory Visit: Payer: Self-pay

## 2020-02-25 ENCOUNTER — Encounter: Payer: Self-pay | Admitting: Cardiology

## 2020-02-25 VITALS — BP 144/100 | HR 80 | Ht 70.0 in | Wt 222.6 lb

## 2020-02-25 DIAGNOSIS — I951 Orthostatic hypotension: Secondary | ICD-10-CM | POA: Diagnosis not present

## 2020-02-25 DIAGNOSIS — I2583 Coronary atherosclerosis due to lipid rich plaque: Secondary | ICD-10-CM | POA: Diagnosis not present

## 2020-02-25 DIAGNOSIS — I2510 Atherosclerotic heart disease of native coronary artery without angina pectoris: Secondary | ICD-10-CM

## 2020-02-25 NOTE — Patient Instructions (Signed)
Medication Instructions:  Please discontinue your Midodrine. Continue all other medications as listed.  *If you need a refill on your cardiac medications before your next appointment, please call your pharmacy*  You have been referred to Dr Benjiman Core.  Follow-Up: At [facility], you and your health needs are our priority.  As part of our continuing mission to provide you with exceptional heart care, we have created designated Provider Care Teams.  These Care Teams include your primary Cardiologist (physician) and Advanced Practice Providers (APPs -  Physician Assistants and Nurse Practitioners) who all work together to provide you with the care you need, when you need it.  We recommend signing up for the patient portal called "MyChart".  Sign up information is provided on this After Visit Summary.  MyChart is used to connect with patients for Virtual Visits (Telemedicine).  Patients are able to view lab/test results, encounter notes, upcoming appointments, etc.  Non-urgent messages can be sent to your provider as well.   To learn more about what you can do with MyChart, go to [email or web address].    Your next appointment:   2 week(s)  The format for your next appointment:   In Person  Provider:   Candee Furbish, MD   Thank you for choosing [facility]!!

## 2020-02-25 NOTE — Progress Notes (Signed)
Cardiology Office Note:    Date:  02/25/2020   ID:  Melinda [name], DOB 1956/05/25, MRN [id number]  PCP:  Jearld Fenton, NP  [facility] Cardiologist:  No primary care provider on file.  [facility] Electrophysiologist:  None   Referring MD: Jearld Fenton, NP     History of Present Illness:    Melinda [name] is a 64 y.o. female problematic orthostatic hypotension.  We have tried Midrin in the past.  My last recommendation on 02/04/2020 was to start Florinef 0.1 mg once a day.  She is here today for basic metabolic profile and for follow-up.  Blood pressures have been 26/33/35 systolic at times.  Makes her feel weak dizzy confused sometimes.  Went to the ER on 8/3 with complaints of near syncope multiple times in 1 day.  Slightly dehydrated slightly elevated creatinine.  Started on Midrin at that time.  She decided to stick with the midodrine instead of starting the Florinef.  She had positive orthostatics.  Past Medical History:  Diagnosis Date  . 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 Amputation of second toe, right, traumatic ([place]) 09/2017  . Anemia    years ago  . Anxiety   . Blue toes    2nd toe on right foot, will get appt.  . Bulging disc   . CAD (coronary artery disease) 2009   s/p stent to LAD  . 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  Ablations done.  Now has rare episodes  . Chicken pox   . Chronic fatigue   . Chronic kidney disease    "problem with kidney filtration"  . Coronary arteritis   . Degenerative disc disease   . Depression   . Diabetes mellitus without complication ([place])   . Facet joint disease   . Fibromyalgia   . Heart disease   . Hyperlipidemia   . IBS (irritable bowel syndrome)   . MCL deficiency, knee   . MRSA (methicillin resistant staph aureus) culture positive 2011   GREAT TOE RIGHT FOOT  . Neuropathy 04/18/2010  . Orthostatic hypotension 01/2020  . Peripheral neuropathy   . Renal  insufficiency   . Restless leg syndrome   . Sepsis ([place]) 09/2013  . Sleep apnea 08/17/2003   uses CPAP, sleep study at [facility] (mild to moderate)  . Spinal stenosis     Past Surgical History:  Procedure Laterality Date  . ABLATION     UTERUS  . ABLATION     HEART  . AMPUTATION TOE Right 02/01/2017   Procedure: AMPUTATION TOE-RIGHT 2ND MPJ;  Surgeon: Albertine Patricia, DPM;  Location: [facility];  Service: Podiatry;  Laterality: Right;  . ANTERIOR CERVICAL DECOMP/DISCECTOMY FUSION N/A 07/28/2014   Procedure: ANTERIOR CERVICAL DECOMPRESSION/DISCECTOMY FUSION CERVICAL 3-4,4-5,5-6 LEVELS WITH INSTRUMENTATION AND ALLOGRAFT;  Surgeon: Sinclair Ship, MD;  Location: [place];  Service: Orthopedics;  Laterality: N/A;  Anterior cervical decompression fusion, cervical 3-4, cervical 4-5, cervical 5-6 with instrumentation and allograft  . BACK SURGERY     X 3 1979, 1994, 1995  . CARPAL TUNNEL RELEASE Right   . CHOLECYSTECTOMY  2003  . CORONARY ANGIOPLASTY  2008  . EYE SURGERY Bilateral 2013   Eyelid lift   . FOOT SURGERY Right    BIG TOE  . FOOT SURGERY Bilateral    PLANTAR FASCIITIS  . FOOT SURGERY Right    2ND TOE  . GALLBLADDER SURGERY    . HAMMER TOE  SURGERY Right 10/17/2017   Procedure: HAMMER TOE CORRECTION-4TH TOE;  Surgeon: Albertine Patricia, DPM;  Location: [place];  Service: Podiatry;  Laterality: Right;  LMA- WITH LOCAL Diabetic - diet controlled  . HAND SURGERY Left   . HAND SURGEY Left   . HEART STENT  2009   LAD  . INFUSION PUMP IMPLANTATION     X2 with morphine and baclofen  . INTRATHECAL PUMP REVISION N/A 04/25/2018   Procedure: Intrathecal pump replacement;  Surgeon: Clydell Hakim, MD;  Location: [place];  Service: Neurosurgery;  Laterality: N/A;  right  . INTRATHECAL PUMP REVISION Right 04/25/2018  . INTRATHECAL PUMP REVISION N/A 12/12/2018   Procedure: Intrathecal pump revision with exploration of pocket;  Surgeon: Clydell Hakim, MD;  Location: [place];  Service: Neurosurgery;  Laterality: N/A;  Intrathecal pump revision with exploration of pocket  . IRRIGATION AND DEBRIDEMENT FOOT Right 02/23/2017   Procedure: IRRIGATION AND DEBRIDEMENT FOOT-right foot;  Surgeon: Samara Deist, DPM;  Location: [facility];  Service: Podiatry;  Laterality: Right;  . PAIN PUMP REVISION N/A 08/15/2018   Procedure: Intrathecal PUMP REVISION;  Surgeon: Clydell Hakim, MD;  Location: [place];  Service: Neurosurgery;  Laterality: N/A;  INTRATHECAL PUMP REVISION  . TOE SURGERY     then revision 8/18    Current Medications: Current Meds  Medication Sig  . ACCU-CHEK AVIVA PLUS test strip USE TO CHECK BLOOD SUGAR ONE TIME A DAY  . Accu-Chek Softclix Lancets lancets USE TO CHECK BLOOD SUGAR 1 TIME DAILY  . AMBULATORY NON FORMULARY MEDICATION Medication Name: CPAP MASK OF CHOICE FOR HOME DEVICE  . aspirin 81 MG tablet Take 81 mg by mouth daily.  Marland Kitchen buPROPion (WELLBUTRIN XL) 150 MG 24 hr tablet TAKE 1 TABLET BY MOUTH EVERY DAY  . Cinnamon 500 MG TABS Take 1,000 mg by mouth daily.  . Coenzyme Q10 (CO Q 10) 100 MG CAPS Take 100 mg by mouth daily.   . DULoxetine (CYMBALTA) 30 MG capsule Take 60 mg by mouth daily.  . fluticasone (FLONASE) 50 MCG/ACT nasal spray Place 2 sprays into both nostrils daily.  Marland Kitchen glucosamine-chondroitin 500-400 MG tablet Take 2 tablets by mouth daily.   . Magnesium 250 MG TABS Take 250 mg by mouth daily.  . Misc Natural Products (TART CHERRY ADVANCED PO) Take 1,200 mg by mouth at bedtime.   Derrill Memo ON 03/01/2020] morphine (MS CONTIN) 15 MG 12 hr tablet Take 1 tablet (15 mg total) by mouth every 12 (twelve) hours. Must last 30 days. Do not break tablet  . [START ON 03/31/2020] morphine (MS CONTIN) 15 MG 12 hr tablet Take 1 tablet (15 mg total) by mouth every 12 (twelve) hours. Must last 30 days. Do not break tablet  . [START ON 04/30/2020] morphine (MS CONTIN) 15 MG 12 hr tablet Take 1 tablet (15 mg total) by mouth every 12 (twelve) hours. Must last 30  days. Do not break tablet  . Multiple Vitamin (MULTIVITAMIN) tablet Take 1 tablet by mouth daily.  . naloxone (NARCAN) 2 MG/2ML injection Inject 1 mL (1 mg total) into the muscle as needed for up to 2 doses (for opioid overdose). Inject content of syringe into thigh muscle. Call 911.  . nitroGLYCERIN (NITROSTAT) 0.4 MG SL tablet Place 1 tablet (0.4 mg total) under the tongue every 5 (five) minutes as needed for chest pain. Please keep upcoming appt in October. Thank you  . NONFORMULARY OR COMPOUNDED ITEM 212.8 mcg by Epidural Infusion route. Medtronic Neuromodulation pump  Fentanyl 212.73mcg/day via IT pump, Baclofen, bupivicaine  . NONFORMULARY OR COMPOUNDED ITEM 88.77 mcg by Epidural Infusion route Continuous EPIDURAL. Medtronic Neuromodulation pump: Fentanyl, baclofen 88.77, bupivicane  . NONFORMULARY OR COMPOUNDED ITEM 5.918 mg by Epidural Infusion route Continuous EPIDURAL. Medtronic Neuromodulation pump: Fentanyl, Baclofen, Bupivicaine 5.918 mg  . Omega-3 Fatty Acids (FISH OIL) 1000 MG CAPS Take 2,000 mg by mouth 2 (two) times daily.   Marland Kitchen PARoxetine (PAXIL) 20 MG tablet TAKE ONE-HALF OF A TABLET BY MOUTH DAILY IN THE MORNING  . polyethylene glycol (MIRALAX / GLYCOLAX) packet Take 17 g by mouth daily.   . pregabalin (LYRICA) 150 MG capsule Take 1 capsule (150 mg total) by mouth 3 (three) times daily.  . rosuvastatin (CRESTOR) 20 MG tablet TAKE 1 TABLET BY MOUTH EVERY DAY  . tiZANidine (ZANAFLEX) 4 MG tablet Take 1 tablet (4 mg total) by mouth 3 (three) times daily.  Marland Kitchen VITAMIN E PO Take 450 Units by mouth daily.   . [DISCONTINUED] midodrine (PROAMATINE) 5 MG tablet Take 1 tablet (5 mg total) by mouth 3 (three) times daily with meals.     Allergies:   Patient has no known allergies.   Social History   Socioeconomic History  . Marital status: Significant Other    Spouse name: Not on file  . Number of children: Not on file  . Years of education: Not on file  . Highest education level: Not on  file  Occupational History  . Not on file  Tobacco Use  . Smoking status: Former Smoker    Packs/day: 1.50    Years: 32.00    Pack years: 48.00    Types: Cigarettes    Quit date: 07/22/2007    Years since quitting: 12.6  . Smokeless tobacco: Never Used  Vaping Use  . Vaping Use: Never used  Substance and Sexual Activity  . Alcohol use: Yes    Alcohol/week: 0.0 standard drinks    Comment: occ - Holidays  . Drug use: Yes    Comment: prescribed pain pump and oxy  . Sexual activity: Yes  Other Topics Concern  . Not on file  Social History Narrative   Lives at home with a partner. Independent at baseline   Social Determinants of Health   Financial Resource Strain:   . Difficulty of Paying Living Expenses: Not on file  Food Insecurity:   . Worried About Charity fundraiser in the Last Year: Not on file  . Ran Out of Food in the Last Year: Not on file  Transportation Needs:   . Lack of Transportation (Medical): Not on file  . Lack of Transportation (Non-Medical): Not on file  Physical Activity:   . Days of Exercise per Week: Not on file  . Minutes of Exercise per Session: Not on file  Stress:   . Feeling of Stress : Not on file  Social Connections:   . Frequency of Communication with Friends and Family: Not on file  . Frequency of Social Gatherings with Friends and Family: Not on file  . Attends Religious Services: Not on file  . Active Member of Clubs or Organizations: Not on file  . Attends Archivist Meetings: Not on file  . Marital Status: Not on file     Family History: The patient's family history includes Colon cancer in her paternal grandfather; Diabetes in her father and paternal grandmother; Heart disease in her maternal grandfather; Lung cancer in her mother. There is no history of Stroke.  ROS:   Please see the history of present illness.     All other systems reviewed and are negative.  EKGs/Labs/Other Studies Reviewed:    The following  studies were reviewed today: See below   EKG:  EKG is ordered today.  The ekg ordered today demonstrates  Recent Labs: 03/12/2019: ALT 18 04/03/2019: Brain Natriuretic Peptide 35 02/02/2020: Hemoglobin 13.6; Platelets 202; TSH 1.965 02/11/2020: BUN 31; Creatinine, Ser 1.43; Potassium 4.6; Sodium 140  Recent Lipid Panel    Component Value Date/Time   CHOL 102 03/12/2019 1255   TRIG 158.0 (H) 03/12/2019 1255   HDL 34.30 (L) 03/12/2019 1255   CHOLHDL 3 03/12/2019 1255   VLDL 31.6 03/12/2019 1255   LDLCALC 36 03/12/2019 1255   LDLDIRECT 63.0 11/04/2017 1326    Physical Exam:    VS:  BP (!) 144/100   Pulse 80   Ht 5\' 10"  (1.778 m)   Wt 222 lb 9.6 oz (101 kg)   LMP  (LMP Unknown)   SpO2 97%   BMI 31.94 kg/m     Wt Readings from Last 3 Encounters:  02/25/20 222 lb 9.6 oz (101 kg)  02/23/20 223 lb (101.2 kg)  02/11/20 223 lb (101.2 kg)     GEN:  Well nourished, well developed in no acute distress HEENT: Normal NECK: No JVD; No carotid bruits LYMPHATICS: No lymphadenopathy CARDIAC: RRR, no murmurs, rubs, gallops RESPIRATORY:  Clear to auscultation without rales, wheezing or rhonchi  ABDOMEN: Soft, non-tender, non-distended MUSCULOSKELETAL:  No edema; No deformity  SKIN: Warm and dry NEUROLOGIC:  Alert and oriented x 3 PSYCHIATRIC:  Normal affect   ASSESSMENT:    1. Orthostatic hypotension   2. Coronary artery disease due to lipid rich plaque    PLAN:    In order of problems listed above:  Orthostatic hypotension -On midodrine now 5 mg 3 times a day. -Blood pressure today 144/100.  On repeat was 150/100.  Electronic cuff was also 149/99.  Still elevated.  I have asked her to stop her midodrine.  She has slowly tapered off the Cymbalta.  Mild fluctuations in blood pressure.  In the ER her blood pressure was 710 systolic with orthostatic changes. -Continue to monitor closely.  We will see her back in 1 to 2 weeks with results.  Chronic pain --has a pain pump in place.  Pump needs to be revised.  There was a kink in the line.  It was twisting.  It was not dispensing at the end.   --Cymbalta - cut it off, tapered off. Too see if low BP can be secondary to this.   Very challenging to control Will refer to ENDOCRINE, ? [place], autonomic dysfunction?  2 days agp 60 SBP, 80 SBP.   Coronary artery disease -LAD stent 2009 DES -Cardiac catheterization 2012 patent stent. -Occasional anginal symptoms.  Try to be careful with nitroglycerin especially with low blood pressure.  We will see in close follow-up.   Medication Adjustments/Labs and Tests Ordered: Current medicines are reviewed at length with the patient today.  Concerns regarding medicines are outlined above.  Orders Placed This Encounter  Procedures  . Ambulatory referral to Endocrinology   No orders of the defined types were placed in this encounter.   Patient Instructions  Medication Instructions:  Please discontinue your Midodrine. Continue all other medications as listed.  *If you need a refill on your cardiac medications before your next appointment, please call your pharmacy*  You have been referred to Dr  Benjiman Core.  Follow-Up: At [facility], you and your health needs are our priority.  As part of our continuing mission to provide you with exceptional heart care, we have created designated Provider Care Teams.  These Care Teams include your primary Cardiologist (physician) and Advanced Practice Providers (APPs -  Physician Assistants and Nurse Practitioners) who all work together to provide you with the care you need, when you need it.  We recommend signing up for the patient portal called "MyChart".  Sign up information is provided on this After Visit Summary.  MyChart is used to connect with patients for Virtual Visits (Telemedicine).  Patients are able to view lab/test results, encounter notes, upcoming appointments, etc.  Non-urgent messages can be sent to your provider as  well.   To learn more about what you can do with MyChart, go to [email or web address].    Your next appointment:   2 week(s)  The format for your next appointment:   In Person  Provider:   Candee Furbish, MD   Thank you for choosing [facility]!!        Signed, Candee Furbish, MD  02/25/2020 4:48 PM    [place]

## 2020-02-26 NOTE — Assessment & Plan Note (Addendum)
-   HX sleep apnea, machine stopped working 2 weeks ago. Reports benefit from CPAP use. She has already contacted [facility] and is being sent a new machine. Patient to resume CPAP once she gets new equipment. Advised her to aim to wear 4-6 hours every night or more. Do not take sedating medication or drink alcohol in excessive prior to bedtime without CPAP as these can worse sleep apnea. Do not drive if experiencing excessive daytime fatigue or somnolence  Orders: - Renew CPAP auto titrate 9-20cm h20; Enroll in [place] with [employer]  Follow-up: - FU in 6-8 week televisit with Beth NP to review CPAP compliance

## 2020-02-27 DIAGNOSIS — Z23 Encounter for immunization: Secondary | ICD-10-CM | POA: Diagnosis not present

## 2020-03-04 ENCOUNTER — Other Ambulatory Visit: Payer: Self-pay

## 2020-03-04 ENCOUNTER — Encounter
Admission: RE | Admit: 2020-03-04 | Discharge: 2020-03-04 | Disposition: A | Discharge status: Home / Self Care | Payer: Medicare Other | Source: Ambulatory Visit | Attending: Pain Medicine | Admitting: Pain Medicine

## 2020-03-04 NOTE — Patient Instructions (Signed)
Your procedure is scheduled on: 03/11/20 Report to [facility]. To find out your arrival time please call [phone] between 1PM - 3PM on 03/10/20.  Remember: Instructions that are not followed completely may result in serious medical risk, up to and including death, or upon the discretion of your surgeon and anesthesiologist your surgery may need to be rescheduled.     _X__ 1. Do not eat food after midnight the night before your procedure.                 No gum chewing or hard candies. You may drink clear liquids up to 2 hours                 before you are scheduled to arrive for your surgery- DO not drink clear                 liquids within 2 hours of the start of your surgery.                 Clear Liquids include:  water, apple juice without pulp, clear carbohydrate                 drink such as Clearfast or Gatorade, Black Coffee or Tea (Do not add                 anything to coffee or tea). Diabetics water only  __X__2.  On the morning of surgery brush your teeth with toothpaste and water, you                 may rinse your mouth with mouthwash if you wish.  Do not swallow any              toothpaste of mouthwash.     _X__ 3.  No Alcohol for 24 hours before or after surgery.   _X__ 4.  Do Not Smoke or use e-cigarettes For 24 Hours Prior to Your Surgery.                 Do not use any chewable tobacco products for at least 6 hours prior to                 surgery.  ____  5.  Bring all medications with you on the day of surgery if instructed.   __X__  6.  Notify your doctor if there is any change in your medical condition      (cold, fever, infections).     Do not wear jewelry, make-up, hairpins, clips or nail polish. Do not wear lotions, powders, or perfumes.  Do not shave 48 hours prior to surgery. Men may shave face and neck. Do not bring valuables to the hospital.    [facility] is not responsible for any belongings or  valuables.  Contacts, dentures/partials or body piercings may not be worn into surgery. Bring a case for your contacts, glasses or hearing aids, a denture cup will be supplied. Leave your suitcase in the car. After surgery it may be brought to your room. For patients admitted to the hospital, discharge time is determined by your treatment team.   Patients discharged the day of surgery will not be allowed to drive home.   Please read over the following fact sheets that you were given:   MRSA Information  __X__ Take these medicines the morning of surgery with A SIP OF WATER:  1. buPROPion (WELLBUTRIN XL) 150 MG 24 hr tablet  2. PARoxetine (PAXIL) 20 MG tablet  3. pregabalin (LYRICA) 150 MG capsule  4.  5.  6.  ____ Fleet Enema (as directed)   __X__ Use CHG Soap/SAGE wipes as directed  ____ Use inhalers on the day of surgery  ____ Stop metformin/Janumet/Farxiga 2 days prior to surgery    ____ Take 1/2 of usual insulin dose the night before surgery. No insulin the morning          of surgery.   ____ Stop Blood Thinners Coumadin/Plavix/Xarelto/Pleta/Pradaxa/Eliquis/Effient/Aspirin  on   Or contact your Surgeon, Cardiologist or Medical Doctor regarding  ability to stop your blood thinners  __X__ Stop Anti-inflammatories 7 days before surgery such as Advil, Ibuprofen, Motrin,  BC or Goodies Powder, Naprosyn, Naproxen, Aleve, Aspirin    __X__ Stop all herbal supplements, fish oil or vitamin E until after surgery.    __X__ Bring C-Pap to the hospital.       How to Use Chlorhexidine for Bathing Chlorhexidine gluconate (CHG) is a germ-killing (antiseptic) solution that is used to clean the skin. It can get rid of the bacteria that normally live on the skin and can keep them away for about 24 hours. To clean your skin with CHG, you may be given:  A CHG solution to use in the shower or as part of a sponge bath.  A prepackaged cloth that contains CHG. Cleaning your skin with CHG  may help lower the risk for infection:  While you are staying in the intensive care unit of the hospital.  If you have a vascular access, such as a central line, to provide short-term or long-term access to your veins.  If you have a catheter to drain urine from your bladder.  If you are on a ventilator. A ventilator is a machine that helps you breathe by moving air in and out of your lungs.  After surgery. What are the risks? Risks of using CHG include:  A skin reaction.  Hearing loss, if CHG gets in your ears.  Eye injury, if CHG gets in your eyes and is not rinsed out.  The CHG product catching fire. Make sure that you avoid smoking and flames after applying CHG to your skin. Do not use CHG:  If you have a chlorhexidine allergy or have previously reacted to chlorhexidine.  On babies younger than 61 months of age. How to use CHG solution  Use CHG only as told by your health care provider, and follow the instructions on the label.  Use the full amount of CHG as directed. Usually, this is one bottle. During a shower Follow these steps when using CHG solution during a shower (unless your health care provider gives you different instructions): 1. Start the shower. 2. Use your normal soap and shampoo to wash your face and hair. 3. Turn off the shower or move out of the shower stream. 4. Pour the CHG onto a clean washcloth. Do not use any type of brush or rough-edged sponge. 5. Starting at your neck, lather your body down to your toes. Make sure you follow these instructions: ? If you will be having surgery, pay special attention to the part of your body where you will be having surgery. Scrub this area for at least 1 minute. ? Do not use CHG on your head or face. If the solution gets into your ears or eyes, rinse them well with water. ? Avoid your genital area. ?  Avoid any areas of skin that have broken skin, cuts, or scrapes. ? Scrub your back and under your arms. Make sure to  wash skin folds. 6. Let the lather sit on your skin for 1-2 minutes or as long as told by your health care provider. 7. Thoroughly rinse your entire body in the shower. Make sure that all body creases and crevices are rinsed well. 8. Dry off with a clean towel. Do not put any substances on your body afterward--such as powder, lotion, or perfume--unless you are told to do so by your health care provider. Only use lotions that are recommended by the manufacturer. 9. Put on clean clothes or pajamas. 10. If it is the night before your surgery, sleep in clean sheets. 11. REPEAT THE MORNING OF SURGERY

## 2020-03-08 ENCOUNTER — Ambulatory Visit (INDEPENDENT_AMBULATORY_CARE_PROVIDER_SITE_OTHER): Payer: Medicare Other | Admitting: Cardiology

## 2020-03-08 ENCOUNTER — Encounter: Payer: Self-pay | Admitting: Cardiology

## 2020-03-08 ENCOUNTER — Other Ambulatory Visit: Payer: Self-pay

## 2020-03-08 VITALS — BP 154/86 | HR 94 | Ht 70.0 in | Wt 223.4 lb

## 2020-03-08 DIAGNOSIS — I2583 Coronary atherosclerosis due to lipid rich plaque: Secondary | ICD-10-CM | POA: Diagnosis not present

## 2020-03-08 DIAGNOSIS — I951 Orthostatic hypotension: Secondary | ICD-10-CM

## 2020-03-08 DIAGNOSIS — I2510 Atherosclerotic heart disease of native coronary artery without angina pectoris: Secondary | ICD-10-CM | POA: Diagnosis not present

## 2020-03-08 NOTE — Progress Notes (Signed)
Cardiology Office Note:    Date:  03/08/2020   ID:  Melinda [name], DOB Sep 07, 1955, MRN [id number]  PCP:  Jearld Fenton, NP  [facility] Cardiologist:  No primary care provider on file.  [facility] Electrophysiologist:  None   Referring MD: Jearld Fenton, NP    History of Present Illness:    Melinda [name] is a 64 y.o. female here for follow-up of orthostatic hypotension.  We ended up starting midodrine because of systolics at times as low as the 70s.  Made her feel weak dizzy confused.  Multiple near syncopal episodes.  Currently her blood pressures have been quite elevated.  However, at home she has had at least one time per week development of orthostatic hypotension.  She checks her blood pressure at home quite regularly.  Denies any neurologic sequelae of high blood pressure, no chest pain, no shortness of breath.  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   Bulging disc    CAD (coronary artery disease) 2009   s/p stent to LAD   Cardiac arrhythmia due to congenital heart disease    WPW.  Ablations done.  Now has rare episodes   Chicken pox    Chronic fatigue    Chronic kidney disease    "problem with kidney filtration"   Coronary arteritis    Degenerative disc disease    Depression    Diabetes mellitus without complication (HCC)    Facet joint disease    Fibromyalgia    Heart disease    Hyperlipidemia    IBS (irritable bowel syndrome)    MCL deficiency, knee    MRSA (methicillin resistant staph aureus) culture positive 2011   GREAT TOE RIGHT FOOT   Neuropathy 04/18/2010   Orthostatic hypotension 01/2020   Peripheral neuropathy    Renal insufficiency    Restless leg syndrome    Sepsis ([place]) 09/2013   Sleep apnea 08/17/2003   uses CPAP, sleep study  at [facility] (mild to moderate)   Spinal stenosis     Past Surgical History:  Procedure Laterality Date   ABLATION     UTERUS   ABLATION     HEART   AMPUTATION TOE Right 02/01/2017   Procedure: AMPUTATION TOE-RIGHT 2ND MPJ;  Surgeon: Albertine Patricia, DPM;  Location: [facility];  Service: Podiatry;  Laterality: Right;   ANTERIOR CERVICAL DECOMP/DISCECTOMY FUSION N/A 07/28/2014   Procedure: ANTERIOR CERVICAL DECOMPRESSION/DISCECTOMY FUSION CERVICAL 3-4,4-5,5-6 LEVELS WITH INSTRUMENTATION AND ALLOGRAFT;  Surgeon: Sinclair Ship, MD;  Location: [place];  Service: Orthopedics;  Laterality: N/A;  Anterior cervical decompression fusion, cervical 3-4, cervical 4-5, cervical 5-6 with instrumentation and allograft   BACK SURGERY     X 3 1979, 1994, 1995   CARPAL TUNNEL RELEASE Right    CHOLECYSTECTOMY  2003   CORONARY ANGIOPLASTY  2008   EYE SURGERY Bilateral 2013   Eyelid lift    FOOT SURGERY Right    BIG TOE   FOOT SURGERY Bilateral    PLANTAR FASCIITIS   FOOT SURGERY Right    2ND TOE   GALLBLADDER SURGERY     HAMMER TOE SURGERY Right 10/17/2017   Procedure: HAMMER TOE CORRECTION-4TH TOE;  Surgeon: Albertine Patricia, DPM;  Location: [facility];  Service: Podiatry;  Laterality: Right;  LMA- WITH LOCAL Diabetic - diet controlled   HAND SURGERY Left    HAND SURGEY Left    HEART STENT  2009   LAD   INFUSION PUMP IMPLANTATION     X2 with morphine and baclofen   INTRATHECAL PUMP REVISION N/A 04/25/2018   Procedure: Intrathecal pump replacement;  Surgeon: Clydell Hakim, MD;  Location: [place];  Service: Neurosurgery;  Laterality: N/A;  right   INTRATHECAL PUMP REVISION Right 04/25/2018   INTRATHECAL PUMP REVISION N/A 12/12/2018   Procedure: Intrathecal pump revision with exploration of pocket;  Surgeon: Clydell Hakim, MD;  Location: [place];  Service: Neurosurgery;  Laterality: N/A;  Intrathecal pump revision with exploration of pocket   IRRIGATION AND DEBRIDEMENT FOOT Right  02/23/2017   Procedure: IRRIGATION AND DEBRIDEMENT FOOT-right foot;  Surgeon: Samara Deist, DPM;  Location: [facility];  Service: Podiatry;  Laterality: Right;   PAIN PUMP REVISION N/A 08/15/2018   Procedure: Intrathecal PUMP REVISION;  Surgeon: Clydell Hakim, MD;  Location: [place];  Service: Neurosurgery;  Laterality: N/A;  INTRATHECAL PUMP REVISION   TOE SURGERY     then revision 8/18    Current Medications: No outpatient medications have been marked as taking for the 03/08/20 encounter (Office Visit) with Jerline Pain, MD.     Allergies:   Patient has no known allergies.   Social History   Socioeconomic History   Marital status: Significant Other    Spouse name: Not on file   Number of children: Not on file   Years of education: Not on file   Highest education level: Not on file  Occupational History   Not on file  Tobacco Use   Smoking status: Former Smoker    Packs/day: 1.50    Years: 32.00    Pack years: 48.00    Types: Cigarettes    Quit date: 07/22/2007    Years since quitting: 12.6   Smokeless tobacco: Never Used  Vaping Use   Vaping Use: Never used  Substance and Sexual Activity   Alcohol use: Yes    Alcohol/week: 0.0 standard drinks    Comment: occ - Holidays   Drug use: Yes    Comment: prescribed pain pump and oxy   Sexual activity: Yes  Other Topics Concern   Not on file  Social History Narrative   Lives at home with a partner. Independent at baseline   Social Determinants of Health   Financial Resource Strain:    Difficulty of Paying Living Expenses: Not on file  Food Insecurity:    Worried About Charity fundraiser in the Last Year: Not on file   [employer] of Food in the Last Year: Not on file  Transportation Needs:    Lack of Transportation (Medical): Not on file   Lack of Transportation (Non-Medical): Not on file  Physical Activity:    Days of Exercise per Week: Not on file   Minutes of Exercise per Session: Not on file  Stress:    Feeling of  Stress : Not on file  Social Connections:    Frequency of Communication with Friends and Family: Not on file   Frequency of Social Gatherings with Friends and Family: Not on file   Attends Religious Services: Not on file   Active Member of Clubs or Organizations: Not on file   Attends Archivist Meetings: Not on file   Marital Status: Not on file  Family History: The patient's family history includes Colon cancer in her paternal grandfather; Diabetes in her father and paternal grandmother; Heart disease in her maternal grandfather; Lung cancer in her mother. There is no history of Stroke.  Recent Labs: 03/12/2019: ALT 18 04/03/2019: Brain Natriuretic Peptide 35 02/02/2020: Hemoglobin 13.6; Platelets 202; TSH 1.965 02/11/2020: BUN 31; Creatinine, Ser 1.43; Potassium 4.6; Sodium 140  Recent Lipid Panel    Component Value Date/Time   CHOL 102 03/12/2019 1255   TRIG 158.0 (H) 03/12/2019 1255   HDL 34.30 (L) 03/12/2019 1255   CHOLHDL 3 03/12/2019 1255   VLDL 31.6 03/12/2019 1255   LDLCALC 36 03/12/2019 1255   LDLDIRECT 63.0 11/04/2017 1326    Physical Exam:    VS:  BP (!) 154/86   Pulse 94   Ht 5\' 10"  (1.778 m)   Wt 223 lb 6.4 oz (101.3 kg)   LMP  (LMP Unknown)   SpO2 96%   BMI 32.05 kg/m     Wt Readings from Last 3 Encounters:  03/08/20 223 lb 6.4 oz (101.3 kg)  03/04/20 220 lb (99.8 kg)  02/25/20 222 lb 9.6 oz (101 kg)     GEN:  Well nourished, well developed in no acute distress HEENT: Normal NECK: No JVD; No carotid bruits LYMPHATICS: No lymphadenopathy CARDIAC: RRR, no murmurs, rubs, gallops RESPIRATORY:  Clear to auscultation without rales, wheezing or rhonchi  ABDOMEN: Soft, non-tender, non-distended MUSCULOSKELETAL:  No edema; No deformity  SKIN: Warm and dry NEUROLOGIC:  Alert and oriented x 3 PSYCHIATRIC:  Normal affect   ASSESSMENT:    No diagnosis found. PLAN:    In order of problems listed above:  Orthostatic hypotension -Blood  pressure 2 weeks ago was 150/100.  Matched electronic cuff.  At that visit we stopped her midodrine.  She also slowly tapered off the Cymbalta. -We put in a referral to endocrine to evaluate for possible hormonal imbalance? -Since she has had an occasional extremely low systolic blood pressure, will hold off on overtreating the systolic.  Lets see how things settle out once her pain pump is revised.  She is not having any neurologic symptoms.  Chronic pain -She is going to have her pain pump revised.  Coronary artery disease -In 2009 had an LAD DES. -In 2012 cardiac catheterization reviewed patent stent. Recent outside labs hemoglobin 13.6 creatinine 1.43 potassium 4.6 TSH 1.9 LDL 36  54-month follow-up   Medication Adjustments/Labs and Tests Ordered: Current medicines are reviewed at length with the patient today.  Concerns regarding medicines are outlined above.  No orders of the defined types were placed in this encounter.  No orders of the defined types were placed in this encounter.   There are no Patient Instructions on file for this visit.   Signed, Candee Furbish, MD  03/08/2020 3:35 PM    [facility]

## 2020-03-08 NOTE — Patient Instructions (Signed)
Medication Instructions:  The current medical regimen is effective;  continue present plan and medications.  *If you need a refill on your cardiac medications before your next appointment, please call your pharmacy*  Follow-Up: At [facility], you and your health needs are our priority.  As part of our continuing mission to provide you with exceptional heart care, we have created designated Provider Care Teams.  These Care Teams include your primary Cardiologist (physician) and Advanced Practice Providers (APPs -  Physician Assistants and Nurse Practitioners) who all work together to provide you with the care you need, when you need it.  We recommend signing up for the patient portal called "MyChart".  Sign up information is provided on this After Visit Summary.  MyChart is used to connect with patients for Virtual Visits (Telemedicine).  Patients are able to view lab/test results, encounter notes, upcoming appointments, etc.  Non-urgent messages can be sent to your provider as well.   To learn more about what you can do with MyChart, go to [email or web address].    Your next appointment:   2 month(s)  The format for your next appointment:   In Person  Provider:   Mark Skains, MD   Thank you for choosing [facility]!!     

## 2020-03-09 ENCOUNTER — Encounter: Payer: Self-pay | Admitting: Pain Medicine

## 2020-03-09 ENCOUNTER — Other Ambulatory Visit
Admission: RE | Admit: 2020-03-09 | Discharge: 2020-03-09 | Disposition: A | Discharge status: Home / Self Care | Payer: Medicare Other | Source: Ambulatory Visit | Attending: Pain Medicine | Admitting: Pain Medicine

## 2020-03-09 DIAGNOSIS — T85615A Breakdown (mechanical) of other nervous system device, implant or graft, initial encounter: Secondary | ICD-10-CM | POA: Diagnosis present

## 2020-03-09 DIAGNOSIS — Z01812 Encounter for preprocedural laboratory examination: Secondary | ICD-10-CM | POA: Diagnosis not present

## 2020-03-09 DIAGNOSIS — Z20822 Contact with and (suspected) exposure to covid-19: Secondary | ICD-10-CM | POA: Diagnosis not present

## 2020-03-09 DIAGNOSIS — T85610A Breakdown (mechanical) of epidural and subdural infusion catheter, initial encounter: Secondary | ICD-10-CM | POA: Diagnosis present

## 2020-03-09 LAB — SARS CORONAVIRUS 2 (TAT 6-24 HRS): SARS Coronavirus 2: NEGATIVE

## 2020-03-10 MED ORDER — ORAL CARE MOUTH RINSE: 15.0000 mL | Freq: Once | OROMUCOSAL | Status: AC

## 2020-03-10 MED ORDER — SODIUM CHLORIDE 0.9 % IV SOLN: INTRAVENOUS | Status: DC

## 2020-03-10 MED ORDER — CHLORHEXIDINE GLUCONATE 0.12 % MT SOLN
15.0000 mL | Freq: Once | OROMUCOSAL | Status: AC
  Administered 2020-03-11: 15 mL via OROMUCOSAL

## 2020-03-11 ENCOUNTER — Ambulatory Visit: Payer: Medicare Other | Admitting: Urgent Care

## 2020-03-11 ENCOUNTER — Encounter: Payer: Self-pay | Admitting: Pain Medicine

## 2020-03-11 ENCOUNTER — Ambulatory Visit: Payer: Medicare Other

## 2020-03-11 ENCOUNTER — Encounter
Admission: RE | Disposition: A | Discharge status: Home / Self Care | Payer: Self-pay | Source: Home / Self Care | Attending: Pain Medicine

## 2020-03-11 ENCOUNTER — Other Ambulatory Visit: Payer: Self-pay

## 2020-03-11 ENCOUNTER — Ambulatory Visit
Admission: RE | Admit: 2020-03-11 | Discharge: 2020-03-11 | Disposition: A | Discharge status: Home / Self Care | Payer: Medicare Other | Attending: Pain Medicine | Admitting: Pain Medicine

## 2020-03-11 DIAGNOSIS — G2581 Restless legs syndrome: Secondary | ICD-10-CM | POA: Insufficient documentation

## 2020-03-11 DIAGNOSIS — M5137 Other intervertebral disc degeneration, lumbosacral region: Secondary | ICD-10-CM | POA: Insufficient documentation

## 2020-03-11 DIAGNOSIS — F419 Anxiety disorder, unspecified: Secondary | ICD-10-CM | POA: Diagnosis not present

## 2020-03-11 DIAGNOSIS — I951 Orthostatic hypotension: Secondary | ICD-10-CM | POA: Insufficient documentation

## 2020-03-11 DIAGNOSIS — Z87891 Personal history of nicotine dependence: Secondary | ICD-10-CM | POA: Insufficient documentation

## 2020-03-11 DIAGNOSIS — E785 Hyperlipidemia, unspecified: Secondary | ICD-10-CM | POA: Insufficient documentation

## 2020-03-11 DIAGNOSIS — T85610D Breakdown (mechanical) of epidural and subdural infusion catheter, subsequent encounter: Secondary | ICD-10-CM | POA: Diagnosis not present

## 2020-03-11 DIAGNOSIS — Z8614 Personal history of Methicillin resistant Staphylococcus aureus infection: Secondary | ICD-10-CM | POA: Insufficient documentation

## 2020-03-11 DIAGNOSIS — E1142 Type 2 diabetes mellitus with diabetic polyneuropathy: Secondary | ICD-10-CM | POA: Diagnosis not present

## 2020-03-11 DIAGNOSIS — E1122 Type 2 diabetes mellitus with diabetic chronic kidney disease: Secondary | ICD-10-CM | POA: Diagnosis not present

## 2020-03-11 DIAGNOSIS — F329 Major depressive disorder, single episode, unspecified: Secondary | ICD-10-CM | POA: Diagnosis not present

## 2020-03-11 DIAGNOSIS — I129 Hypertensive chronic kidney disease with stage 1 through stage 4 chronic kidney disease, or unspecified chronic kidney disease: Secondary | ICD-10-CM | POA: Diagnosis not present

## 2020-03-11 DIAGNOSIS — M797 Fibromyalgia: Secondary | ICD-10-CM | POA: Insufficient documentation

## 2020-03-11 DIAGNOSIS — Z89411 Acquired absence of right great toe: Secondary | ICD-10-CM | POA: Diagnosis not present

## 2020-03-11 DIAGNOSIS — T85625A Displacement of other nervous system device, implant or graft, initial encounter: Secondary | ICD-10-CM | POA: Insufficient documentation

## 2020-03-11 DIAGNOSIS — I456 Pre-excitation syndrome: Secondary | ICD-10-CM | POA: Diagnosis not present

## 2020-03-11 DIAGNOSIS — T85610A Breakdown (mechanical) of epidural and subdural infusion catheter, initial encounter: Secondary | ICD-10-CM | POA: Diagnosis present

## 2020-03-11 DIAGNOSIS — G4733 Obstructive sleep apnea (adult) (pediatric): Secondary | ICD-10-CM | POA: Diagnosis not present

## 2020-03-11 DIAGNOSIS — N183 Chronic kidney disease, stage 3 unspecified: Secondary | ICD-10-CM | POA: Diagnosis not present

## 2020-03-11 DIAGNOSIS — I2510 Atherosclerotic heart disease of native coronary artery without angina pectoris: Secondary | ICD-10-CM | POA: Diagnosis not present

## 2020-03-11 DIAGNOSIS — T85620A Displacement of epidural and subdural infusion catheter, initial encounter: Secondary | ICD-10-CM | POA: Clinically undetermined

## 2020-03-11 DIAGNOSIS — K589 Irritable bowel syndrome without diarrhea: Secondary | ICD-10-CM | POA: Insufficient documentation

## 2020-03-11 DIAGNOSIS — G4730 Sleep apnea, unspecified: Secondary | ICD-10-CM | POA: Diagnosis not present

## 2020-03-11 DIAGNOSIS — G894 Chronic pain syndrome: Secondary | ICD-10-CM | POA: Diagnosis not present

## 2020-03-11 DIAGNOSIS — R5382 Chronic fatigue, unspecified: Secondary | ICD-10-CM | POA: Diagnosis not present

## 2020-03-11 DIAGNOSIS — Z79899 Other long term (current) drug therapy: Secondary | ICD-10-CM | POA: Insufficient documentation

## 2020-03-11 DIAGNOSIS — X58XXXA Exposure to other specified factors, initial encounter: Secondary | ICD-10-CM | POA: Insufficient documentation

## 2020-03-11 DIAGNOSIS — Z955 Presence of coronary angioplasty implant and graft: Secondary | ICD-10-CM | POA: Insufficient documentation

## 2020-03-11 DIAGNOSIS — N189 Chronic kidney disease, unspecified: Secondary | ICD-10-CM | POA: Insufficient documentation

## 2020-03-11 DIAGNOSIS — M5441 Lumbago with sciatica, right side: Secondary | ICD-10-CM | POA: Insufficient documentation

## 2020-03-11 DIAGNOSIS — Z981 Arthrodesis status: Secondary | ICD-10-CM | POA: Insufficient documentation

## 2020-03-11 DIAGNOSIS — Z9049 Acquired absence of other specified parts of digestive tract: Secondary | ICD-10-CM | POA: Insufficient documentation

## 2020-03-11 DIAGNOSIS — T85615A Breakdown (mechanical) of other nervous system device, implant or graft, initial encounter: Secondary | ICD-10-CM | POA: Diagnosis present

## 2020-03-11 DIAGNOSIS — M961 Postlaminectomy syndrome, not elsewhere classified: Secondary | ICD-10-CM | POA: Diagnosis present

## 2020-03-11 DIAGNOSIS — M545 Low back pain, unspecified: Secondary | ICD-10-CM | POA: Diagnosis present

## 2020-03-11 DIAGNOSIS — T85695A Other mechanical complication of other nervous system device, implant or graft, initial encounter: Secondary | ICD-10-CM | POA: Diagnosis not present

## 2020-03-11 DIAGNOSIS — Z89421 Acquired absence of other right toe(s): Secondary | ICD-10-CM | POA: Insufficient documentation

## 2020-03-11 DIAGNOSIS — G96198 Other disorders of meninges, not elsewhere classified: Secondary | ICD-10-CM | POA: Insufficient documentation

## 2020-03-11 DIAGNOSIS — G8929 Other chronic pain: Secondary | ICD-10-CM | POA: Diagnosis present

## 2020-03-11 HISTORY — PX: INTRATHECAL PUMP REVISION: SHX6810

## 2020-03-11 LAB — GLUCOSE, CAPILLARY
Glucose-Capillary: 145 mg/dL — ABNORMAL HIGH (ref 70–99)
Glucose-Capillary: 154 mg/dL — ABNORMAL HIGH (ref 70–99)

## 2020-03-11 SURGERY — INTRATHECAL PUMP REVISION
Anesthesia: General

## 2020-03-11 MED ORDER — DEXAMETHASONE SODIUM PHOSPHATE 10 MG/ML IJ SOLN
INTRAMUSCULAR | Status: AC
  Filled 2020-03-11: qty 1

## 2020-03-11 MED ORDER — DIPHENHYDRAMINE HCL 50 MG/ML IJ SOLN
INTRAMUSCULAR | Status: AC
  Filled 2020-03-11: qty 1

## 2020-03-11 MED ORDER — ONDANSETRON HCL 4 MG/2ML IJ SOLN
INTRAMUSCULAR | Status: AC
  Filled 2020-03-11: qty 4

## 2020-03-11 MED ORDER — ROCURONIUM BROMIDE 100 MG/10ML IV SOLN
INTRAVENOUS | Status: DC | PRN
  Administered 2020-03-11: 30 mg via INTRAVENOUS

## 2020-03-11 MED ORDER — MIDAZOLAM HCL 2 MG/2ML IJ SOLN
INTRAMUSCULAR | Status: DC | PRN
  Administered 2020-03-11: 2 mg via INTRAVENOUS

## 2020-03-11 MED ORDER — PHENYLEPHRINE HCL (PRESSORS) 10 MG/ML IV SOLN
INTRAVENOUS | Status: AC
  Filled 2020-03-11: qty 1

## 2020-03-11 MED ORDER — LIDOCAINE HCL URETHRAL/MUCOSAL 2 % EX GEL
CUTANEOUS | Status: AC
  Filled 2020-03-11: qty 5

## 2020-03-11 MED ORDER — HYDROGEN PEROXIDE 3 % EX SOLN
CUTANEOUS | Status: DC | PRN
  Administered 2020-03-11: 1

## 2020-03-11 MED ORDER — EPHEDRINE SULFATE 50 MG/ML IJ SOLN
INTRAMUSCULAR | Status: DC | PRN
  Administered 2020-03-11: 5 mg via INTRAVENOUS

## 2020-03-11 MED ORDER — DEXMEDETOMIDINE (PRECEDEX) IN NS 20 MCG/5ML (4 MCG/ML) IV SYRINGE
PREFILLED_SYRINGE | INTRAVENOUS | Status: AC
  Filled 2020-03-11: qty 5

## 2020-03-11 MED ORDER — FAMOTIDINE 20 MG PO TABS
ORAL_TABLET | ORAL | Status: AC
  Filled 2020-03-11: qty 1

## 2020-03-11 MED ORDER — CHLORHEXIDINE GLUCONATE 0.12 % MT SOLN
OROMUCOSAL | Status: AC
  Filled 2020-03-11: qty 15

## 2020-03-11 MED ORDER — VANCOMYCIN HCL IN DEXTROSE 1-5 GM/200ML-% IV SOLN
INTRAVENOUS | Status: AC
  Administered 2020-03-11: 1000 mg via INTRAVENOUS
  Filled 2020-03-11: qty 200

## 2020-03-11 MED ORDER — PROPOFOL 10 MG/ML IV BOLUS
INTRAVENOUS | Status: DC | PRN
  Administered 2020-03-11: 50 mg via INTRAVENOUS
  Administered 2020-03-11: 150 mg via INTRAVENOUS

## 2020-03-11 MED ORDER — GLYCOPYRROLATE 0.2 MG/ML IJ SOLN
INTRAMUSCULAR | Status: AC
  Filled 2020-03-11: qty 1

## 2020-03-11 MED ORDER — SUGAMMADEX SODIUM 500 MG/5ML IV SOLN
INTRAVENOUS | Status: DC | PRN
  Administered 2020-03-11: 500 mg via INTRAVENOUS

## 2020-03-11 MED ORDER — FENTANYL CITRATE (PF) 100 MCG/2ML IJ SOLN: 25.0000 ug | INTRAMUSCULAR | Status: DC | PRN

## 2020-03-11 MED ORDER — LIDOCAINE HCL (CARDIAC) PF 100 MG/5ML IV SOSY
PREFILLED_SYRINGE | INTRAVENOUS | Status: DC | PRN
  Administered 2020-03-11: 100 mg via INTRAVENOUS

## 2020-03-11 MED ORDER — POVIDONE-IODINE 10 % EX SOLN
CUTANEOUS | Status: DC | PRN
  Administered 2020-03-11: 1 via TOPICAL

## 2020-03-11 MED ORDER — ACETAMINOPHEN 10 MG/ML IV SOLN
INTRAVENOUS | Status: AC
  Filled 2020-03-11: qty 100

## 2020-03-11 MED ORDER — FENTANYL CITRATE (PF) 100 MCG/2ML IJ SOLN
INTRAMUSCULAR | Status: DC | PRN
Start: 2020-03-11 — End: 2020-03-11
  Administered 2020-03-11 (×2): 50 ug via INTRAVENOUS

## 2020-03-11 MED ORDER — BUPIVACAINE HCL (PF) 0.5 % IJ SOLN
INTRAMUSCULAR | Status: AC
  Filled 2020-03-11: qty 30

## 2020-03-11 MED ORDER — CEFAZOLIN SODIUM-DEXTROSE 2-4 GM/100ML-% IV SOLN
INTRAVENOUS | Status: AC
  Filled 2020-03-11: qty 100

## 2020-03-11 MED ORDER — PHENYLEPHRINE HCL (PRESSORS) 10 MG/ML IV SOLN
INTRAVENOUS | Status: DC | PRN
  Administered 2020-03-11 (×2): 100 ug via INTRAVENOUS

## 2020-03-11 MED ORDER — OXYCODONE HCL 5 MG/5ML PO SOLN: 5.0000 mg | Freq: Once | ORAL | Status: AC | PRN

## 2020-03-11 MED ORDER — ONDANSETRON HCL 4 MG/2ML IJ SOLN
INTRAMUSCULAR | Status: DC | PRN
  Administered 2020-03-11 (×2): 4 mg via INTRAVENOUS

## 2020-03-11 MED ORDER — SODIUM CHLORIDE (PF) 0.9 % IJ SOLN
INTRAMUSCULAR | Status: AC
  Filled 2020-03-11: qty 50

## 2020-03-11 MED ORDER — VANCOMYCIN HCL IN DEXTROSE 1-5 GM/200ML-% IV SOLN: 1000.0000 mg | INTRAVENOUS | Status: AC

## 2020-03-11 MED ORDER — KETOROLAC TROMETHAMINE 30 MG/ML IJ SOLN: 30.0000 mg | Freq: Once | INTRAMUSCULAR | Status: DC

## 2020-03-11 MED ORDER — DIPHENHYDRAMINE HCL 50 MG/ML IJ SOLN
12.5000 mg | Freq: Once | INTRAMUSCULAR | Status: AC
  Administered 2020-03-11: 12.5 mg via INTRAVENOUS

## 2020-03-11 MED ORDER — ONDANSETRON HCL 4 MG/2ML IJ SOLN: 4.0000 mg | Freq: Once | INTRAMUSCULAR | Status: DC | PRN

## 2020-03-11 MED ORDER — SUCCINYLCHOLINE CHLORIDE 20 MG/ML IJ SOLN
INTRAMUSCULAR | Status: DC | PRN
  Administered 2020-03-11: 100 mg via INTRAVENOUS

## 2020-03-11 MED ORDER — LIDOCAINE HCL (PF) 1 % IJ SOLN
INTRAMUSCULAR | Status: AC
  Filled 2020-03-11: qty 30

## 2020-03-11 MED ORDER — FENTANYL CITRATE (PF) 100 MCG/2ML IJ SOLN
INTRAMUSCULAR | Status: AC
  Filled 2020-03-11: qty 2

## 2020-03-11 MED ORDER — SUCCINYLCHOLINE CHLORIDE 200 MG/10ML IV SOSY
PREFILLED_SYRINGE | INTRAVENOUS | Status: AC
  Filled 2020-03-11: qty 10

## 2020-03-11 MED ORDER — CHLORHEXIDINE GLUCONATE CLOTH 2 % EX PADS
6.0000 | MEDICATED_PAD | Freq: Once | CUTANEOUS | Status: AC
  Administered 2020-03-11: 6 via TOPICAL

## 2020-03-11 MED ORDER — ROPIVACAINE HCL 5 MG/ML IJ SOLN
INTRAMUSCULAR | Status: AC
  Filled 2020-03-11: qty 20

## 2020-03-11 MED ORDER — ESMOLOL HCL 100 MG/10ML IV SOLN
INTRAVENOUS | Status: DC | PRN
  Administered 2020-03-11: 20 mg via INTRAVENOUS
  Administered 2020-03-11: 10 mg via INTRAVENOUS

## 2020-03-11 MED ORDER — DEXAMETHASONE SODIUM PHOSPHATE 10 MG/ML IJ SOLN
INTRAMUSCULAR | Status: DC | PRN
  Administered 2020-03-11: 10 mg via INTRAVENOUS

## 2020-03-11 MED ORDER — CEFAZOLIN SODIUM-DEXTROSE 2-4 GM/100ML-% IV SOLN
2.0000 g | INTRAVENOUS | Status: AC
  Administered 2020-03-11: 2 g via INTRAVENOUS

## 2020-03-11 MED ORDER — ROCURONIUM BROMIDE 10 MG/ML (PF) SYRINGE
PREFILLED_SYRINGE | INTRAVENOUS | Status: AC
  Filled 2020-03-11: qty 10

## 2020-03-11 MED ORDER — PROPOFOL 10 MG/ML IV BOLUS
INTRAVENOUS | Status: AC
  Filled 2020-03-11: qty 20

## 2020-03-11 MED ORDER — OXYCODONE HCL 5 MG PO TABS
ORAL_TABLET | ORAL | Status: DC
Start: 2020-03-11 — End: 2020-03-11
  Filled 2020-03-11: qty 1

## 2020-03-11 MED ORDER — KETOROLAC TROMETHAMINE 30 MG/ML IJ SOLN
INTRAMUSCULAR | Status: AC
  Administered 2020-03-11: 30 mg
  Filled 2020-03-11: qty 1

## 2020-03-11 MED ORDER — OXYCODONE HCL 5 MG PO TABS
5.0000 mg | ORAL_TABLET | Freq: Once | ORAL | Status: AC | PRN
  Administered 2020-03-11: 5 mg via ORAL

## 2020-03-11 MED ORDER — FAMOTIDINE 20 MG PO TABS
20.0000 mg | ORAL_TABLET | Freq: Once | ORAL | Status: AC
  Administered 2020-03-11: 20 mg via ORAL

## 2020-03-11 MED ORDER — ACETAMINOPHEN 500 MG PO TABS: 1000.0000 mg | ORAL_TABLET | Freq: Four times a day (QID) | ORAL | Status: DC

## 2020-03-11 MED ORDER — SUGAMMADEX SODIUM 500 MG/5ML IV SOLN
INTRAVENOUS | Status: AC
  Filled 2020-03-11: qty 5

## 2020-03-11 MED ORDER — EPHEDRINE 5 MG/ML INJ
INTRAVENOUS | Status: AC
  Filled 2020-03-11: qty 10

## 2020-03-11 MED ORDER — GLYCOPYRROLATE 0.2 MG/ML IJ SOLN
INTRAMUSCULAR | Status: DC | PRN
  Administered 2020-03-11: .2 mg via INTRAVENOUS

## 2020-03-11 MED ORDER — ACETAMINOPHEN 10 MG/ML IV SOLN
INTRAVENOUS | Status: DC | PRN
  Administered 2020-03-11: 1000 mg via INTRAVENOUS

## 2020-03-11 MED ORDER — MIDAZOLAM HCL 2 MG/2ML IJ SOLN
INTRAMUSCULAR | Status: AC
  Filled 2020-03-11: qty 2

## 2020-03-11 SURGICAL SUPPLY — 56 items
BINDER ABDOMINAL 12 ML 46-62 (SOFTGOODS) ×1 IMPLANT
BLADE SURG 15 STRL LF DISP TIS (BLADE) ×1 IMPLANT
BLADE SURG 15 STRL SS (BLADE) ×2
CATH ASCENDA 1PIECE (Catheter) ×1 IMPLANT
CNTNR SPEC 2.5X3XGRAD LEK (MISCELLANEOUS)
CONT SPEC 4OZ STER OR WHT (MISCELLANEOUS)
CONT SPEC 4OZ STRL OR WHT (MISCELLANEOUS)
CONTAINER SPEC 2.5X3XGRAD LEK (MISCELLANEOUS) ×1 IMPLANT
DECANTER SPIKE VIAL GLASS SM (MISCELLANEOUS) ×3 IMPLANT
DRAPE C-ARM XRAY 36X54 (DRAPES) ×2 IMPLANT
DRAPE C-ARMOR (DRAPES) ×1 IMPLANT
DRAPE INCISE IOBAN 66X45 STRL (DRAPES) ×2 IMPLANT
DRSG TEGADERM 4X4.75 (GAUZE/BANDAGES/DRESSINGS) ×5 IMPLANT
DRSG TELFA 3X8 NADH (GAUZE/BANDAGES/DRESSINGS) ×4 IMPLANT
DURAPREP 26ML APPLICATOR (WOUND CARE) ×2 IMPLANT
ELECT REM PT RETURN 9FT ADLT (ELECTROSURGICAL) ×2
ELECTRODE REM PT RTRN 9FT ADLT (ELECTROSURGICAL) ×1 IMPLANT
GLOVE BIO SURGEON STRL SZ8 (GLOVE) ×2 IMPLANT
GLOVE SURG XRAY 8.5 LX (GLOVE) ×2 IMPLANT
GOWN STRL REUS W/ TWL LRG LVL3 (GOWN DISPOSABLE) ×1 IMPLANT
GOWN STRL REUS W/ TWL XL LVL3 (GOWN DISPOSABLE) ×1 IMPLANT
GOWN STRL REUS W/TWL LRG LVL3 (GOWN DISPOSABLE) ×2
GOWN STRL REUS W/TWL XL LVL3 (GOWN DISPOSABLE) ×2
KIT CATHETER ACCESS PORT (KITS) ×1 IMPLANT
KIT REFILL (MISCELLANEOUS) ×1
KIT REFILL CATH SYNCHROMED II (MISCELLANEOUS) IMPLANT
KIT TURNOVER KIT A (KITS) ×2 IMPLANT
LABEL OR SOLS (LABEL) ×2 IMPLANT
NDL HYPO 25X1 1.5 SAFETY (NEEDLE) ×1 IMPLANT
NDL SPNL 22GX3.5 QUINCKE BK (NEEDLE) ×1 IMPLANT
NEEDLE HYPO 25X1 1.5 SAFETY (NEEDLE) ×2 IMPLANT
NEEDLE SPNL 22GX3.5 QUINCKE BK (NEEDLE) ×2 IMPLANT
PACK BASIN MINOR (MISCELLANEOUS) ×2 IMPLANT
PACK UNIVERSAL (MISCELLANEOUS) ×2 IMPLANT
PAD DRESSING TELFA 3X8 NADH (GAUZE/BANDAGES/DRESSINGS) ×2 IMPLANT
PASSER CATH 38CM DISP (INSTRUMENTS) ×1 IMPLANT
PENCIL ELECTRO HAND CTR (MISCELLANEOUS) ×1 IMPLANT
POUCH TYRX ANTIBAC NEURO LRG (Mesh General) IMPLANT
POUCH TYRX NEURO LRG (Mesh General) ×2 IMPLANT
SOL PREP PVP 2OZ (MISCELLANEOUS) ×2
SOLUTION PREP PVP 2OZ (MISCELLANEOUS) ×1 IMPLANT
SPONGE DRAIN TRACH 4X4 STRL 2S (GAUZE/BANDAGES/DRESSINGS) ×1 IMPLANT
STAPLER SKIN PROX 35W (STAPLE) ×2 IMPLANT
SUT PROLENE 2 0 FS (SUTURE) ×4 IMPLANT
SUT SILK 0 SH 30 (SUTURE) ×2 IMPLANT
SUT SILK 2 0 SH (SUTURE) ×2 IMPLANT
SUT VIC AB 2-0 SH 27 (SUTURE) ×4
SUT VIC AB 2-0 SH 27XBRD (SUTURE) ×2 IMPLANT
SWABSTK COMLB BENZOIN TINCTURE (MISCELLANEOUS) ×1 IMPLANT
SYR 10ML LL (SYRINGE) ×4 IMPLANT
SYR EPIDURAL 5ML GLASS (SYRINGE) ×2 IMPLANT
SYR TB 1ML LUER SLIP (SYRINGE) ×1 IMPLANT
TRAY EPID 17G 3.5 FULL TOUHY (SET/KITS/TRAYS/PACK) ×1 IMPLANT
WATER STERILE IRR 1000ML POUR (IV SOLUTION) ×2 IMPLANT
medtronic catheter access port kit ×1 IMPLANT
perifix continuous epidural anesthesia tray ×1 IMPLANT

## 2020-03-11 NOTE — Transfer of Care (Signed)
Immediate Anesthesia Transfer of Care Note  Patient: Melinda [name]  Procedure(s) Performed: INTRATHECAL PUMP REVISION (N/A )  Patient Location: PACU  Anesthesia Type:General  Level of Consciousness: awake, oriented and patient cooperative  Airway & Oxygen Therapy: Patient Spontanous Breathing  Post-op Assessment: Report given to RN and Post -op Vital signs reviewed and stable  Post vital signs: Reviewed and stable  Last Vitals:  Vitals Value Taken Time  BP 127/78 03/11/20 1030  Temp 36.4 C 03/11/20 1030  Pulse 94 03/11/20 1034  Resp 14 03/11/20 1034  SpO2 94 % 03/11/20 1034  Vitals shown include unvalidated device data.  Last Pain:  Vitals:   03/11/20 0642  TempSrc: Temporal  PainSc: 4          Complications: No complications documented.

## 2020-03-11 NOTE — Discharge Instructions (Addendum)
____________________________________________________________________________________________  YOU HAD OXYCODONE 5MG  AT [place] AT 11:24 AM. YOU HAD BENADRYL 12.5MG  AT [place] AT 12:36 PM  Post-Procedure Discharge Instructions  Instructions:  Apply ice:   Purpose: This will minimize any swelling and discomfort after procedure.   When: Day of procedure, as soon as you get home.  How: Fill a plastic sandwich bag with crushed ice. Cover it with a small towel and apply to injection site.  How long: (15 min on, 15 min off) Apply for 15 minutes then remove x 15 minutes.  Repeat sequence on day of procedure, until you go to bed.  Apply heat:   Purpose: To treat any soreness and discomfort from the procedure.  When: Starting the next day after the procedure.  How: Apply heat to procedure site starting the day following the procedure.  How long: May continue to repeat daily, until discomfort goes away.  Food intake: Start with clear liquids (like water) and advance to regular food, as tolerated.   Physical activities: Keep activities to a minimum for the first 8 hours after the procedure. After that, then as tolerated.  Driving: If you have received any sedation, be responsible and do not drive. You are not allowed to drive for 24 hours after having sedation.  Blood thinner: (Applies only to those taking blood thinners) You may restart your blood thinner 6 hours after your procedure.  Insulin: (Applies only to Diabetic patients taking insulin) As soon as you can eat, you may resume your normal dosing schedule.  Infection prevention: Keep procedure site clean and dry. Shower daily and clean area with soap and water.  Post-procedure Pain Diary: Extremely important that this be done correctly and accurately. Recorded information will be used to determine the next step in treatment. For the purpose of accuracy, follow these rules:  Evaluate only the area treated. Do not report  or include pain from an untreated area. For the purpose of this evaluation, ignore all other areas of pain, except for the treated area.  After your procedure, avoid taking a long nap and attempting to complete the pain diary after you wake up. Instead, set your alarm clock to go off every hour, on the hour, for the initial 8 hours after the procedure. Document the duration of the numbing medicine, and the relief you are getting from it.  Do not go to sleep and attempt to complete it later. It will not be accurate. If you received sedation, it is likely that you were given a medication that may cause amnesia. Because of this, completing the diary at a later time may cause the information to be inaccurate. This information is needed to plan your care.  Follow-up appointment: Keep your post-procedure follow-up evaluation appointment after the procedure (usually 2 weeks for most procedures, 6 weeks for radiofrequencies). DO NOT FORGET to bring you pain diary with you.   Expect: (What should I expect to see with my procedure?)  From numbing medicine (AKA: Local Anesthetics): Numbness or decrease in pain. You may also experience some weakness, which if present, could last for the duration of the local anesthetic.  Onset: Full effect within 15 minutes of injected.  Duration: It will depend on the type of local anesthetic used. On the average, 1 to 8 hours.   From steroids (Applies only if steroids were used): Decrease in swelling or inflammation. Once inflammation is improved, relief of the pain will follow.  Onset of benefits: Depends on the amount of swelling present.  The more swelling, the longer it will take for the benefits to be seen. In some cases, up to 10 days.  Duration: Steroids will stay in the system x 2 weeks. Duration of benefits will depend on multiple posibilities including persistent irritating factors.  Side-effects: If present, they may typically last 2 weeks (the duration of the  steroids).  Frequent: Cramps (if they occur, drink Gatorade and take over-the-counter Magnesium 450-500 mg once to twice a day); water retention with temporary weight gain; increases in blood sugar; decreased immune system response; increased appetite.  Occasional: Facial flushing (red, warm cheeks); mood swings; menstrual changes.  Uncommon: Long-term decrease or suppression of natural hormones; bone thinning. (These are more common with higher doses or more frequent use. This is why we prefer that our patients avoid having any injection therapies in other practices.)   Very Rare: Severe mood changes; psychosis; aseptic necrosis.  From procedure: Some discomfort is to be expected once the numbing medicine wears off. This should be minimal if ice and heat are applied as instructed.  Call if: (When should I call?)  You experience numbness and weakness that gets worse with time, as opposed to wearing off.  New onset bowel or bladder incontinence. (Applies only to procedures done in the spine)  Emergency Numbers:  Durning business hours (Monday - Thursday, 8:00 AM - 4:00 PM) (Friday, 9:00 AM - 12:00 Noon): [phone]  After hours: [phone]  NOTE: If you are having a problem and are unable connect with, or to talk to a provider, then go to your nearest urgent care or emergency department. If the problem is serious and urgent, please call 911. ____________________________________________________________________________________________   Intrathecal Pain Pump Implantation, Care After This sheet gives you information about how to care for yourself after your procedure. Your health care provider may also give you more specific instructions. If you have problems or questions, contact your health care provider. What can I expect after the procedure? After the procedure, it is common to have: Pain in your back and abdomen. A slight headache. A reaction to your pain medicine that may  include itchy skin, drowsiness, and nausea. Follow these instructions at home: Incision care  Follow instructions from your health care provider about how to take care of your incisions. Make sure you: Wash your hands with soap and water before and after you change your bandage (dressing). If soap and water are not available, use hand sanitizer. Change your dressings as told by your health care provider. Leave stitches (sutures), skin glue, or adhesive strips in place. These skin closures may need to stay in place for 2 weeks or longer. If adhesive strip edges start to loosen and curl up, you may trim the loose edges. Do not remove adhesive strips completely unless your health care provider tells you to do that. Check your incision areas every day for signs of infection. Check for: Redness, swelling, or pain. Fluid or blood. Warmth. Pus or a bad smell. Medicines Take over-the-counter and prescription medicines only as told by your health care provider. If you were prescribed an antibiotic medicine, take it as told by your health care provider. Do not stop taking the antibiotic even if you start to feel better. Ask your health care provider if the medicine prescribed to you: Requires you to avoid driving or using heavy machinery. Can cause constipation. You may need to take these actions to prevent or treat constipation: Drink enough fluid to keep your urine pale yellow. Take  over-the-counter or prescription medicines. Eat foods that are high in fiber, such as beans, whole grains, and fresh fruits and vegetables. Limit foods that are high in fat and processed sugars, such as fried or sweet foods. Bathing Do not take baths, swim, or use a hot tub until your health care provider approves. Ask your health care provider if you may take showers. You may only be allowed to take sponge baths. Keep the dressing dry until your health care provider says it can be removed. Activity Limit your movement  and activities as told by your health care provider. You may be told to: Avoid sleeping on your belly. Avoid bending or stretching. Avoid raising your arms over your head. Avoid lifting anything that is heavier than 5 lb (2.3 kg). Avoid doing work around the house, such as loading the dishwasher, vacuuming, or mowing the lawn. Return to your normal activities as told by your health care provider. Ask your health care provider what activities are safe for you. General instructions Do not drive for 2 weeks or until your health care provider says it is okay. Do not use any products that contain nicotine or tobacco, such as cigarettes, e-cigarettes, and chewing tobacco. If you need help quitting, ask your health care provider. Do not drink alcohol if: Your health care provider tells you not to drink. You are pregnant, may be pregnant, or are planning to become pregnant. If you drink alcohol: Limit how much you use to: 0-1 drink a day for women. 0-2 drinks a day for men. Be aware of how much alcohol is in your drink. In the [place] one drink equals one 12 oz bottle of beer (355 mL), one 5 oz glass of wine (148 mL), or one 1 oz glass of hard liquor (44 mL). Do not use recreational drugs. Keep all follow-up visits as told by your health care provider. This is important. Contact a health care provider if: You have redness, swelling, or pain around your incision. You have fluid or blood coming from your incision. Your incision feels warm to the touch. You have pus or a bad smell coming from your incision. You vomit. You have a headache for more than 2 days. You feel confused, anxious, dizzy, or depressed. You have trouble urinating or having a bowel movement. Your pain is not being relieved by the pump. You have a fever. The alarm on your pump starts to beep. Get help right away if you: Have trouble breathing. Have a seizure or you pass out. Have sudden back pain or weakness in your  legs. Lose control over urination or bowel movements (have incontinence). Summary After the procedure, it is common to have back or abdominal pain, a slight headache, and a reaction to the drug that may include itchiness, drowsiness, and nausea. Follow instructions from your health care provider about how to take care of your incisions. Limit your movement and activities as told by your health care provider. Do not use nicotine or tobacco products or recreational drugs. Do not drink alcohol if your health care provider tells you not to. This information is not intended to replace advice given to you by your health care provider. Make sure you discuss any questions you have with your health care provider. Document Revised: 01/15/2018 Document Reviewed: 01/15/2018 Elsevier Patient Education  [place]   1) The drugs that you were given will stay in your system until tomorrow so for the next 24  hours you should not:  A) Drive an automobile B) Make any legal decisions C) Drink any alcoholic beverage   2) You may resume regular meals tomorrow.  Today it is better to start with liquids and gradually work up to solid foods.  You may eat anything you prefer, but it is better to start with liquids, then soup and crackers, and gradually work up to solid foods.   3) Please notify your doctor immediately if you have any unusual bleeding, trouble breathing, redness and pain at the surgery site, drainage, fever, or pain not relieved by medication.    4) Additional Instructions:        Please contact your physician with any problems or Same Day Surgery at [phone], Monday through Friday 6 am to 4 pm, or [place] at [facility] number at [phone].

## 2020-03-11 NOTE — Interval H&P Note (Signed)
Patient's Name: Melinda [name]  MRN: [id number]  Referring Provider: No ref. provider found  DOB: [birth year]  PCP: Jearld Fenton, NP  DOS: 02/24/2020  Note by: Gaspar Cola, MD  Service setting: Outpatient Same Day Surgery  Specialty: Interventional Pain Management  Patient type: Established  Location: [facility]  Encounter type: Pre-operative Evaluation   Admission Date & Time: 03/11/2020  6:13 AM  History and Physical Interval Note:  [name] has presented today for surgery, with the diagnosis of : Malfunction of intrathecal infusion pump.  I have reviewed the patient's chart, labs, images, history and physical exam. No new additional findings. No change in status. [name] is stable for surgery. The various alternatives of treatment have been discussed with the patient and family. The patient has been informed of the risks and possible complications. After consideration of risks, benefits and other options for treatment, the patient has consented to Procedure(s): INTRATHECAL PUMP REVISION: , as a surgical intervention . All questions have been answered to the patient's satisfaction.  Plan: Proceed with surgery as planned.  OR Scheduled Time: 0715  Note by: Gaspar Cola, MD Date: 02/24/2020; Time: 6:23 AM

## 2020-03-11 NOTE — Anesthesia Procedure Notes (Signed)
Procedure Name: Intubation Performed by: Kelton Pillar, CRNA Pre-anesthesia Checklist: Patient identified, Emergency Drugs available, Suction available and Patient being monitored Patient Re-evaluated:Patient Re-evaluated prior to induction Oxygen Delivery Method: Circle system utilized Preoxygenation: Pre-oxygenation with 100% oxygen Induction Type: IV induction Ventilation: Mask ventilation without difficulty Laryngoscope Size: McGraph and 3 Tube type: Oral Tube size: 7.0 mm Number of attempts: 1 Airway Equipment and Method: Stylet and Oral airway Placement Confirmation: ETT inserted through vocal cords under direct vision,  positive ETCO2,  breath sounds checked- equal and bilateral and CO2 detector Secured at: 22 cm Tube secured with: Tape Dental Injury: Teeth and Oropharynx as per pre-operative assessment

## 2020-03-11 NOTE — OR Nursing (Signed)
Pt c/o "my eyes are burning".  Dr. Lubertha Basque notified of same and in to see pt in postop 1220 pm.  Patient advises Dr. Lubertha Basque that she is itching in her abdominal area and visualized by her.  Saline bullets/flush administered to both eyes as instructed.  Will give IV toradol and IV benadryl as ordered.

## 2020-03-11 NOTE — Anesthesia Postprocedure Evaluation (Signed)
Anesthesia Post Note  Patient: Melinda [name]  Procedure(s) Performed: INTRATHECAL PUMP REVISION (N/A )  Patient location during evaluation: PACU Anesthesia Type: General Level of consciousness: awake and alert Pain management: pain level controlled Vital Signs Assessment: post-procedure vital signs reviewed and stable Respiratory status: spontaneous breathing, nonlabored ventilation, respiratory function stable and patient connected to nasal cannula oxygen Cardiovascular status: blood pressure returned to baseline and stable Postop Assessment: no apparent nausea or vomiting Comments:   Pt had bilateral conjunctival injection with burning and stinging post op.  She also c/o generalized skin itching.  No rash seen.  Pt given IV Benadryl for itching, and Toradol and topical saline drops for eye irritation with subsequent marked improvement in her symptoms.  Unclear exact etiology.  Discharge instructions provided.  Pt should call the office if worsening eye irritation for a referral to ophthalmology if needed.     Last Vitals:  Vitals:   03/11/20 1319 03/11/20 1357  BP: (!) 144/71 (!) 159/83  Pulse: 86 85  Resp: 18 16  Temp: 36.7 C   SpO2:  96%    Last Pain:  Vitals:   03/11/20 1357  TempSrc:   PainSc: [place]

## 2020-03-11 NOTE — Op Note (Addendum)
Operative Report PROVIDER NOTE: Information contained herein reflects review and annotations entered in association with encounter. Interpretation of such information and data should be left to medically-trained personnel. Information provided to patient can be located elsewhere in the medical record under "Patient Instructions". Document created using STT-dictation technology, any transcriptional errors that may result from process are unintentional.  Procedure:  Intrathecal pump implant (pump reservoir and catheter)  Description: Implantation of device for intrathecal drug infusion; programmable pump, including preparation of pump, with programming. (CPT code: [phone]) & implantation of tunneled intrathecal catheter for long-term medication administration via an implantable reservoir/infusion pump; without laminectomy (CPT code: 62350) Type: Elective Date: 03/11/2020  Time: 2:07 PM  Patient: Melinda [name]  64 y.o. female) Diagnosis Pre-op: Nonfunctional pain pump Post-op: Intrathecal catheter migration Procedure: Intrathecal pump revision with exploration of pocket Operating Room: [place] 02  Surgeon: [name], MD  Assistant(s): None  Anesthesia: General  Anesthesia Staff:  Anesthesiologist: Tera Mater, MD CRNA: Kelton Pillar, CRNA; Rosebud Poles C, CRNA EBL: Minimal Blood Administered: None Drains: None Meds ordered this encounter  Medications  . OR Linked Order Group   . chlorhexidine (PERIDEX) 0.12 % solution 15 mL   . MEDLINE mouth rinse  . 0.9 %  sodium chloride infusion  . famotidine (PEPCID) tablet 20 mg  . Chlorhexidine Gluconate Cloth 2 % PADS 6 each  . ceFAZolin (ANCEF) IVPB 2g/100 mL premix    Order Specific Question:   Indication:    Answer:   Surgical Prophylaxis  . vancomycin (VANCOCIN) IVPB 1000 mg/200 mL premix    Order Specific Question:   Indication:    Answer:   Surgical Prophylaxis  . chlorhexidine (PERIDEX) 0.12 % solution     Strader, Linsey   : cabinet override  . famotidine (PEPCID) 20 MG tablet    Strader, Linsey   : cabinet override  . vancomycin (VANCOCIN) 1-5 GM/200ML-% IVPB    Strader, Linsey   : cabinet override  . ceFAZolin (ANCEF) 2-4 GM/100ML-% IVPB    Strader, Linsey   : cabinet override  . fentaNYL (SUBLIMAZE) injection 25-50 mcg  . OR Linked Order Group   . oxyCODONE (Oxy IR/ROXICODONE) immediate release tablet 5 mg   . oxyCODONE (ROXICODONE) 5 MG/5ML solution 5 mg  . ondansetron (ZOFRAN) injection 4 mg  . DISCONTD: hydrogen peroxide 3 % external solution  . DISCONTD: povidone-iodine (BETADINE) 10 % external solution  . acetaminophen (TYLENOL) tablet 1,000 mg  . oxyCODONE (Oxy IR/ROXICODONE) 5 MG immediate release tablet    Tobin Chad, Eve   : cabinet override  . ketorolac (TORADOL) 30 MG/ML injection 30 mg  . diphenhydrAMINE (BENADRYL) injection 12.5 mg  . diphenhydrAMINE (BENADRYL) 50 MG/ML injection    Phillips Grout   : cabinet override  . ketorolac (TORADOL) 30 MG/ML injection    Phillips Grout   : cabinet override   Specimen & disposition: N/A Complete Count: YES Tourniquet: None Plan of Care: Discharge to 01-Home or Self Care after PACU  Patient Disposition: F/U at [facility] as outpatient.  Implants    Catheter   Cath Ascenda 1piece - [id number] - Implanted   (Right) Thoracic Level 11-12   Inventory item: CATH ASCENDA 1PIECE Model/Cat number: [id number]   Manufacturer: MEDTRONIC NEUROMOD PAIN MGMT Lot number: [id number]   Area Of Implantation: Thoracic Level 11-12     As of 03/11/2020    Status: Implanted             Mesh General   Pouch  Tyrx Neuro Large - [id number] - Implanted   (Right) Thoracic Level 11-12   Inventory item: [facility] LARGE Model/Cat number: [id number]   Manufacturer: MEDTRONIC NEUROLOGIC TECHNOL Lot number: [id number]   As of 03/11/2020    Status: Implanted              Surgical Procedure #1  Anesthesia, Analgesia, Anxiolysis:  Type: Intrathecal  Programmable Pump (reservoir/battery) Revision (CPT U7653405) Region: Abdominal (Pump) Pump Level: Mid-abdominal (Umbilical) Pump Laterality: Right-sided  Type: General ETT (General) Staff:  Anesthesiologist: Tera Mater, MD CRNA: Kelton Pillar, CRNA; Caryl Asp, CRNA   Surgical Procedure #2    Type: Tunneled Intrathecal Catheter Revision and Replacement w/o laminectomy (CPT 62350) Region: Thoraco-Lumbar Insertion Level:  T12-L1 Catheter Tip Level: T6-7 Laterality: Midline     Position: Left Lateral Decubitus Target Area: Anterior Abdominal Wall & Intrathecal Space Approach: Surgical approach. Area Prepped: Entire Posterior Thoracolumbar, Abdominal, & Lumbosacral Region Prepping solution: ChloraPrep (2% chlorhexidine gluconate and 70% isopropyl alcohol)   Indications: 1. Malfunction of intrathecal infusion pump    Pain Score: Pre-procedure: 4 /10 Post-procedure: 3 /10   Pre-op Assessment:  Melinda [name] is a 64 y.o. (year old), female patient, seen today for intrathecal pump implant surgery. 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  Initial Vital Signs:  Pulse/HCG Rate: 90ECG Heart Rate: 95 Temp: 98 F (36.7 C) Resp: 18 BP: (!) 151/78 SpO2: 94 %  BMI:  Estimated body mass index is 31.95 kg/m as calculated from the following:   Height as of this encounter: $RemoveBeforeD'5\' 10"'[id number]$  (1.778 m).   Weight as of this encounter: 101 kg.  Risk Assessment: Allergies: Reviewed. She has No Known Allergies.  Allergy Precautions: None required Coagulopathies: Reviewed. None identified.  Blood-thinner therapy: None at this time Active Infection(s): Reviewed. None identified. Melinda [name] is afebrile  Site Confirmation: Melinda [name] was asked to confirm the procedure and laterality before marking the site, which she did.  Procedure checklist: Completed  Consent: Before the procedure and under the influence of no sedative(s), amnesic(s), or anxiolytics, the patient was informed of the treatment options, risks and possible complications. To fulfill our ethical and legal obligations, as recommended by the American Medical Association's Code of Ethics, I have informed the patient of my clinical impression; the nature and purpose of the treatment or procedure; the risks, benefits, and possible complications of the intervention; the alternatives, including doing nothing; the risk(s) and benefit(s) of the alternative treatment(s) or procedure(s); and the risk(s) and benefit(s) of doing nothing.  Melinda [name] was provided with information about the general risks and possible complications associated with most interventional procedures. These include, but are not limited to: failure to achieve desired goals, infection, bleeding, organ or nerve damage, allergic reactions, paralysis, and/or death.  In addition, she was informed of those risks and possible complications associated to this particular procedure, which include, but are not limited to: damage to the implant; mechanical failure of the implant; failure to decrease pain; local, systemic,  or serious CNS infections, intraspinal abscess with possible cord compression and paralysis, or life-threatening such as meningitis; intrathecal  and/or epidural bleeding with formation of hematoma with possible spinal cord compression and permanent paralysis; organ damage; nerve injury or damage with subsequent sensory, motor, and/or autonomic system dysfunction, resulting in transient or permanent pain, numbness, and/or weakness of one or several areas of the body; allergic reactions, either minor or major life-threatening, such as anaphylactic or anaphylactoid reactions. She was also informed of the possibility of unforseen future implant recall due to currently unknown problems. She was reminded that mechanical all devices fail with time, requiring further interventional therapies that may, or may not, be performed by me.  Furthermore, Melinda [name] was informed of those risks and complications associated with the medications. These include, but are not limited to: allergic reactions (i.e.: anaphylactic or anaphylactoid reactions; or chemical meningitis); arrhythmia;  Hypotension/hypertension; cardiovascular collapse; respiratory depression and/or shortness of breath; swelling or edema; medication-induced neural toxicity; intrathecal granulomas; particulate matter embolism and blood vessel occlusion with resultant organ, and/or nervous system infarction and permanent paralysis; and/or unpredictable catastrophic complication or reaction leading to permanent damage or death. She was also informed of the possible off-label use of intrathecal medications for analgesic purposes.  Finally, she was informed that Medicine is not an exact science; therefore, there is also the possibility of unforeseen or unpredictable risks and/or possible complications that may result in a catastrophic outcome. The patient indicated having understood very clearly. We have given the patient no guarantees and we have made no promises. Enough time was given to the patient to ask questions, all of which were answered to the patient's satisfaction. Melinda [name] has indicated that she  wanted to continue with the procedure.  Attestation: I, the ordering provider, attest that I have discussed with the patient the benefits, risks, side-effects, alternatives, likelihood of achieving goals, and potential problems during recovery for the procedure that I have provided informed consent.  Pre-Procedure Preparation:  Monitoring: As per clinic protocol. Respiration, ETCO2, SpO2, BP, heart rate and rhythm monitor placed and checked for adequate function Safety Precautions: Patient was assessed for positional comfort and pressure points before starting the procedure. Time-out: I initiated and conducted the "Time-out" before starting the procedure, as per protocol. The patient was asked to participate by confirming the accuracy of the "Time Out" information. Verification of the correct person, site, and procedure were performed and confirmed by me, the nursing staff, and the patient. "Time-out" conducted as per Joint Commission's Universal Protocol (UP.01.01.01).  Description of Procedure:   Safety Precautions: Aspiration looking for blood return was conducted prior to all injections. At no point did we inject any substances, as a needle was being advanced. No attempts were made at seeking any paresthesias. Safe injection practices and needle disposal techniques used. Medications properly checked for expiration dates. SDV (single dose vial) medications used. Description of the procedure: The procedure site was prepped using a broad-spectrum topical antiseptic. The area was then draped in the usual and standard manner. "Time-out" was performed as per JC Universal Protocol (UP.01.01.01).   Pump and catheter revision with catheter replacement: Using the location of the prior incision, I entered the intrathecal pump pocket, being careful not to damage the device or the intrathecal catheter.  Once I had entered the pocket, it was clear that there was significant amount of fluid within the pocket and  this was aspirated.  The fluid aspirated was clear and transparent with no evidence of infection.  This  fluid was similar to that previously drained from the pocket.  The intrathecal pump was identified and moved out of the pocket.  Since the primary problem for which we took the patient to the OR was because we had collected evidence that none of the medication was being infused, I went ahead and disconnected the catheter and attempted to aspirate through it.  Immediately became evident that there was some type of obstruction not allowing any CSF to come back and also nothing else to be moving in the intrathecal direction.  At this point decision was made to further look into the catheter.  Prior to aspirating I had removed all of the twists found and catheter.  None of this maneuvers helped with obtaining any free flow of CSF back.  Fluoroscopy was then used to follow the catheter into the area of the spine.  The initial impression was that the catheter was not within the epidural or intrathecal space.  The catheter seems to have moved out of the space (migrated).  An incision was then made in the back, where her prior intrathecal catheter incision was located.  Fluoroscopy was used to narrow down the location of the anchor.  Once the anchor was identified, it was removed along with the catheter.  Using a loss-of-resistance technique I then entered the epidural space using an epidural needle.  After having enter the epidural space, I then switched the loss-of-resistance technique to an aspiration technique using a 1 cc syringe.  As soon as I obtained CSF, I stopped.  The 1 cc syringe was removed and clear free flow of CSF was observed.  Then noon intrathecal catheter was then threaded through the needle and follow-up with fluoroscopy to the region of the T7/T6 vertebral body.  The needle was removed and the catheter anchored with the device provided with the Medtronic kit.  The new catheter was then tunneled under  the skin to the right flank where a small stab wound was made to assist with the path of the catheter.  The catheter was tunneled to the area of the small incision and later tunneled from that small incision to the area of the pump.  The catheter was trimmed and connected to a new pump extension/connection.  Aspiration through the side-port of the pump confirmed persistent free flow of CSF.  The total length of the implanted catheter was calculated to be a 102.1 cm.  The medication and the pump was removed to confirm volume once this volume was confirmed it was placed back into the pump.  This was done under sterile conditions.  The pump was programmed to the liver 20% less then what it we had run him before.  The purpose of this was to create a safety assumption that perhaps the older infusion rate was not going to the intrathecal space and resuming that rate could result in delivery of more medicine than previously predicted.  The pump initially had a total of 255.5 g/day per day being delivered and after the 20% decrease this was shown to be 204.3 mcg/day.  In the case of the baclofen it went from 61.33 mcg/day to 49.04 mcg/day.  Finally, in the case of the bupivacaine it went from 5.111 mg/day to 4.086 mg/day. Once we had reconnected the pump to the new catheter, the pump was anchored with 4 nonabsorbable, monofilament stitches.  The antibiotic mesh was placed under the pump as well as the catheter.  The pocket was then cleaned using a solution  consisting of 50: 50 hydrogen peroxide: Betadine.  Once the inside of the pocket was cleaned with this solution, it was removed and dried.  The pocket was initially closed with Vicryl 2-0 and the skin was closed with surgical staples.  The incision on the back was closed in a similar manner.  The stab wound on the right flank was closed with Dermabond.  Benzoin was applied to the closed incision, then covered by Telfa, and an OpSite.  EBL: 10 mL  Complications:  None  Disposition: Return to clinics in 10 - 11 days for post-procedure evaluation and removal of staples.  Additional Comments/Plan: None.  Vitals:   03/11/20 1130 03/11/20 1142 03/11/20 1319 03/11/20 1357  BP: (!) 157/74 (!) 154/78 (!) 144/71 (!) 159/83  Pulse: 94 84 86 85  Resp: $Remo'17 16 18 16  'tCyPY$ Temp:  97.9 F (36.6 C) 98 F (36.7 C)   TempSrc:  Temporal Temporal   SpO2: 100% 100%  96%  Weight:      Height:        Start Time:  7:30 AM Time to End: 2 Hr 20 Min 46 Sec  Intrathecal Implant Details:   Catheter:  Intrathecal access level: T12-L1 Brand: Medtronic Location: Midline, Posterior Intrathecal space Tip Location: T6 Model No.: 8780 MRI compatibility: Yes  Additional equipment: TYRX Antibacterial envelope (Minocycline/rifampin)(REF: [id number]  LOT: [id number]  ED: 2018/11/26)  Imaging Guidance (Spinal):          Type of Imaging Technique: Fluoroscopy Guidance (Spinal) Indication(s): Assistance in needle guidance and placement for procedures requiring needle placement in or near specific anatomical locations not easily accessible without such assistance. Exposure Time: Please see nurses notes. Contrast: None used. Fluoroscopic Guidance: I was personally present during the use of fluoroscopy. "Tunnel Vision Technique" used to obtain the best possible view of the target area. Parallax error corrected before commencing the procedure. "Direction-depth-direction" technique used to introduce the needle under continuous pulsed fluoroscopy. Once target was reached, antero-posterior, oblique, and lateral fluoroscopic projection used confirm needle placement in all planes. Images permanently stored in EMR. Interpretation: No contrast injected. I personally interpreted the imaging intraoperatively. Adequate needle placement confirmed in multiple planes. Permanent images saved into the patient's record.  Antibiotic Prophylaxis:   Anti-infectives (From admission, onward)   Start      Dose/Rate Route Frequency Ordered Stop   03/11/20 0651  ceFAZolin (ANCEF) 2-4 GM/100ML-% IVPB       Note to Pharmacy: Leonia Reader   : cabinet override      03/11/20 0651 03/11/20 0800   03/11/20 0630  ceFAZolin (ANCEF) IVPB 2g/100 mL premix        2 g 200 mL/hr over 30 Minutes Intravenous On call to O.R. 03/11/20 0615 03/11/20 0752   03/11/20 0630  vancomycin (VANCOCIN) IVPB 1000 mg/200 mL premix        1,000 mg 200 mL/hr over 60 Minutes Intravenous On call to O.R. 03/11/20 0615 03/11/20 0755     Indication(s): None identified  Post-operative Assessment:  Post-procedure Vital Signs:  Pulse/HCG Rate: 8592 Temp: 98 F (36.7 C) Resp: 16 BP: (!) 159/83 SpO2: 96 %  Complications: No immediate post-treatment complications observed by team, or reported by patient.  Note: The patient tolerated the entire procedure well. A repeat set of vitals were taken after the procedure and the patient was kept under observation following institutional policy, for this type of procedure. Post-procedural neurological assessment was performed, showing return to baseline, prior to discharge. The patient was provided  with post-procedure discharge instructions, including a section on how to identify potential problems. Should any problems arise concerning this procedure, the patient was given instructions to immediately contact us, at any time, without hesitation. In any case, we plan to contact the patient by telephone for a follow-up status report regarding this interventional procedure.  Comments:  No additional relevant information.  Post-op Plan of Care  Disposition: Discharge home under the care of a responsible, capable, adult driver. Return to clinics in 6-10 days for post-procedure evaluation and removal of staples.  Primary Care Physician: Jearld Fenton, NP Location: [facility]  Note by: Gaspar Cola, MD Date: 02/24/2020; Time: 2:07 PM  Patient: Melinda [name]  Service Category: Surgery  Provider: Gaspar Cola, MD  DOB: April 01, 1956  DOS: 02/24/2020  Location: [place]  MRN: [id number]  Setting: Ambulatory - outpatient  Referring Provider: No ref. provider found  Type: Established Patient  Specialty: Interventional Pain Management  PCP: Jearld Fenton, NP

## 2020-03-11 NOTE — OR Nursing (Signed)
Dr. Lubertha Basque back in to see patient.  Patient advises abdominal itching and eyes burning have improved.  Dr. Lubertha Basque gave patient saline bullets, told her to use at home throughout the day and to contact her eye doctor if not resolved by tomorrow.  Patient states she will do.

## 2020-03-11 NOTE — Anesthesia Preprocedure Evaluation (Signed)
Anesthesia Evaluation  Patient identified by MRN, date of birth, ID band Patient awake    Reviewed: Allergy & Precautions, H&P , NPO status , Patient's Chart, lab work & pertinent test results  History of Anesthesia Complications Negative for: history of anesthetic complications  Airway Mallampati: II  TM Distance: >3 FB     Dental  (+) Chipped   Pulmonary sleep apnea , neg COPD, former smoker,    breath sounds clear to auscultation       Cardiovascular hypertension, (-) angina+ CAD  (-) Past MI and (-) Cardiac Stents (-) dysrhythmias  Rhythm:regular Rate:Normal     Neuro/Psych PSYCHIATRIC DISORDERS Anxiety Depression Peripheral neuropathy negative neurological ROS     GI/Hepatic negative GI ROS, Neg liver ROS,   Endo/Other  diabetes  Renal/GU Renal disease (CKD)     Musculoskeletal   Abdominal   Peds  Hematology negative hematology ROS (+)   Anesthesia Other Findings Past Medical History: 05/30/2010: Amputation of great toe, right, traumatic ([place]) 09/2017: Amputation of second toe, right, traumatic ([facility]) No date: Anemia     Comment:  years ago No date: Anxiety No date: Blue toes     Comment:  2nd toe on right foot, will get appt. No date: Bulging disc 2009: CAD (coronary artery disease)     Comment:  s/p stent to LAD No date: Cardiac arrhythmia due to congenital heart disease     Comment:  WPW.  Ablations done.  Now has rare episodes No date: Chicken pox No date: Chronic fatigue No date: Chronic kidney disease     Comment:  "problem with kidney filtration" No date: Coronary arteritis No date: Degenerative disc disease No date: Depression No date: Diabetes mellitus without complication (HCC) No date: Facet joint disease No date: Fibromyalgia No date: Heart disease No date: Hyperlipidemia No date: IBS (irritable bowel syndrome) No date: MCL deficiency, knee 2011: MRSA (methicillin resistant staph  aureus) culture positive     Comment:  GREAT TOE RIGHT FOOT 04/18/2010: Neuropathy 01/2020: Orthostatic hypotension No date: Peripheral neuropathy No date: Renal insufficiency No date: Restless leg syndrome 09/2013: Sepsis ([place]) 08/17/2003: Sleep apnea     Comment:  uses CPAP, sleep study at [facility] (mild to               moderate) No date: Spinal stenosis  Past Surgical History: No date: ABLATION     Comment:  UTERUS No date: ABLATION     Comment:  HEART 02/01/2017: AMPUTATION TOE; Right     Comment:  Procedure: AMPUTATION TOE-RIGHT 2ND MPJ;  Surgeon:               Albertine Patricia, DPM;  Location: [facility];  Service:               Podiatry;  Laterality: Right; 07/28/2014: ANTERIOR CERVICAL DECOMP/DISCECTOMY FUSION; N/A     Comment:  Procedure: ANTERIOR CERVICAL DECOMPRESSION/DISCECTOMY               FUSION CERVICAL 3-4,4-5,5-6 LEVELS WITH INSTRUMENTATION               AND ALLOGRAFT;  Surgeon: Sinclair Ship, MD;                Location: [place];  Service: Orthopedics;  Laterality: N/A;              Anterior cervical decompression fusion, cervical 3-4,               cervical 4-5, cervical  5-6 with instrumentation and               allograft No date: BACK SURGERY     Comment:  X 3 1979, 1994, 1995 No date: [place]; Right 2003: CHOLECYSTECTOMY 2008: CORONARY ANGIOPLASTY 2013: EYE SURGERY; Bilateral     Comment:  Eyelid lift  No date: FOOT SURGERY; Right     Comment:  BIG TOE No date: FOOT SURGERY; Bilateral     Comment:  PLANTAR FASCIITIS No date: FOOT SURGERY; Right     Comment:  2ND TOE No date: GALLBLADDER SURGERY 10/17/2017: HAMMER TOE SURGERY; Right     Comment:  Procedure: HAMMER TOE CORRECTION-4TH TOE;  Surgeon:               Albertine Patricia, DPM;  Location: [place];                Service: Podiatry;  Laterality: Right;  LMA- WITH               LOCAL Diabetic - diet controlled No date: HAND SURGERY; Left No date: HAND  SURGEY; Left 2009: HEART STENT     Comment:  LAD No date: INFUSION PUMP IMPLANTATION     Comment:  X2 with morphine and baclofen 04/25/2018: INTRATHECAL PUMP REVISION; N/A     Comment:  Procedure: Intrathecal pump replacement;  Surgeon:               Clydell Hakim, MD;  Location: [place];  Service:               Neurosurgery;  Laterality: N/A;  right 04/25/2018: INTRATHECAL PUMP REVISION; Right 12/12/2018: INTRATHECAL PUMP REVISION; N/A     Comment:  Procedure: Intrathecal pump revision with exploration of              pocket;  Surgeon: Clydell Hakim, MD;  Location: [place];                Service: Neurosurgery;  Laterality: N/A;  Intrathecal               pump revision with exploration of pocket 02/23/2017: IRRIGATION AND DEBRIDEMENT FOOT; Right     Comment:  Procedure: SWNIOEVOJJ AND DEBRIDEMENT FOOT-right foot;                Surgeon: Samara Deist, DPM;  Location: [facility];                Service: Podiatry;  Laterality: Right; 08/15/2018: PAIN PUMP REVISION; N/A     Comment:  Procedure: Intrathecal PUMP REVISION;  Surgeon: Clydell Hakim, MD;  Location: [place];  Service: Neurosurgery;                Laterality: N/A;  INTRATHECAL PUMP REVISION No date: TOE SURGERY     Comment:  then revision 8/18  BMI    Body Mass Index: 31.95 kg/m      Reproductive/Obstetrics negative OB ROS                             Anesthesia Physical Anesthesia Plan  ASA: III  Anesthesia Plan: General ETT   Post-op Pain Management:    Induction:   PONV Risk Score and Plan: Ondansetron, Dexamethasone, Midazolam and Treatment may vary due to age or medical condition  Airway Management Planned:   Additional Equipment:  Intra-op Plan:   Post-operative Plan:   Informed Consent: I have reviewed the patients History and Physical, chart, labs and discussed the procedure including the risks, benefits and alternatives for the proposed anesthesia with the patient or  authorized representative who has indicated his/her understanding and acceptance.     Dental Advisory Given  Plan Discussed with: Anesthesiologist, CRNA and Surgeon  Anesthesia Plan Comments:         Anesthesia Quick Evaluation

## 2020-03-11 NOTE — Brief Op Note (Signed)
Date: 03/11/2020  Time: 10:29 AM  Patient: Melinda [name]  64 y.o. female) Diagnosis Pre-op: Nonfunctional pain pump Post-op: Nonfunctional pain pump Procedure: Intrathecal pump revision with exploration of pocket Operating Room: [facility] OR ROOM 02  Surgeon: [name], MD  Assistant(s): None  Anesthesia: General  Anesthesia Staff:  Anesthesiologist: Tera Mater, MD CRNA: Kelton Pillar, CRNA; Rosebud Poles C, CRNA EBL: Minimal Blood Administered: None Drains: None Meds ordered this encounter  Medications  . OR Linked Order Group   . chlorhexidine (PERIDEX) 0.12 % solution 15 mL   . MEDLINE mouth rinse  . 0.9 %  sodium chloride infusion  . famotidine (PEPCID) tablet 20 mg  . Chlorhexidine Gluconate Cloth 2 % PADS 6 each  . ceFAZolin (ANCEF) IVPB 2g/100 mL premix    Order Specific Question:   Indication:    Answer:   Surgical Prophylaxis  . vancomycin (VANCOCIN) IVPB 1000 mg/200 mL premix    Order Specific Question:   Indication:    Answer:   Surgical Prophylaxis  . chlorhexidine (PERIDEX) 0.12 % solution    Strader, Linsey   : cabinet override  . famotidine (PEPCID) 20 MG tablet    Strader, Linsey   : cabinet override  . vancomycin (VANCOCIN) 1-5 GM/200ML-% IVPB    Strader, Linsey   : cabinet override  . ceFAZolin (ANCEF) 2-4 GM/100ML-% IVPB    Strader, Linsey   : cabinet override  . DISCONTD: hydrogen peroxide 3 % external solution  . DISCONTD: povidone-iodine (BETADINE) 10 % external solution   Specimen & disposition: * No specimens in log * Complete Count: YES Tourniquet: * No tourniquets in log *  Dictation: Note written in EPIC Plan of Care: Discharge to 01-Home or Self Care after PACU  Patient Disposition: F[facility]U at [facility] as outpatient. Implants    Catheter   Cath Ascenda 1piece - [id number] - Implanted   (Right) Thoracic Level 11-12   Inventory item: CATH ASCENDA 1PIECE Model/Cat number: [id number]   Manufacturer: MEDTRONIC NEUROMOD PAIN  MGMT Lot number: [id number]   Area Of Implantation: Thoracic Level 11-12     As of 03/11/2020    Status: Implanted             Bone Implant   Lordotic Cervical Vbr 7mm Sm - [id number] - Implanted  Neck   Inventory item: LORDOTIC CERVICAL VBR 7MM SM Model/Cat number: [id number]   Manufacturer: TITAN SPINE     As of 07/28/2014    Status: Implanted             Implant   Acis 67mm Screw - Implanted  Neck   Model/Cat number: [id number] Manufacturer: Lurena Nida DEPUY SPINE   As of 07/28/2014    Status: Implanted             Pump Connector Rev Kit Sutless - [id number] - Implanted   (Right) Abdomen   Inventory item: PUMP CONNECTOR REV KIT SUTLESS Model/Cat number: [id number]   Manufacturer: [place] Lot number: [id number]   As of 04/25/2018    Status: Implanted             Mesh General   Pouch Tyrx Neuro Large - [id number] - Implanted  Abdomen   Inventory item: [facility] LARGE Model/Cat number: [id number]   Manufacturer: MEDTRONIC NEUROLOGIC TECHNOL Lot number: [id number]   As of 08/15/2018    Status: Implanted             Pouch Tyrx Neuro Large - [id number] - Implanted   (  Right) Thoracic Level 11-12   Inventory item: [facility] NEURO LARGE Model/Cat number: [id number]   Manufacturer: MEDTRONIC NEUROLOGIC TECHNOL Lot number: [id number]   As of 03/11/2020    Status: Implanted             Neuro Prosthesis/Implant   Pump Synchromed 2 - [id number] - Implanted   (Right) Abdomen   Inventory item: PUMP SYNCHROMED 2 Model/Cat number: [id number]   Serial number: [id number] Manufacturer: MEDTRONIC NEUROMOD PAIN MGMT   As of 04/25/2018    Status: Implanted             Envelope Absorb Antibacterial - [id number] - Implanted   (Right) Abdomen   Inventory item: ENVELOPE ABSORB ANTIBACTERIAL Model/Cat number: [id number]   Manufacturer: MEDTRONIC SOFAMOR DANEK Lot number: [id number]   As of 04/25/2018    Status: Implanted             Orthopedic Implant   Implant S Endoskel Tc  2mm Odeg - [id number] - Implanted  Neck   Inventory item: IMPLANT S ENDOSKEL TC 7MM ODEG Model/Cat number: [id number]   Manufacturer: TITAN SPINE     As of 07/28/2014    Status: Implanted             Implant S Endoskel Tc 69mm Odeg - [id number] - Implanted  Neck   Inventory item: IMPLANT S ENDOSKEL TC 8MM ODEG Model/Cat number: [id number]   Manufacturer: TITAN SPINE     As of 07/28/2014    Status: Implanted             Plate   Plate Skyline 3 Lvl 27OJ - [id number] - Implanted  Neck   Inventory item: PLATE SKYLINE 3 LVL 99BZ Model/Cat number: [id number]   Manufacturer: Lurena Nida DEPUY SPINE     As of 07/28/2014    Status: Implanted             Pump   Pump - Implanted     Model/Cat number: [id number] Manufacturer: MEDTRONIC NEUROMOD PAIN MGMT   As of 02/22/2017    Status: Implanted             Putty   Putty 5cc Dbx Mix - [id number] - Implanted  Neck   Inventory item: PUTTY 5CC DBX MIX Model/Cat number: [id number]   Serial number: [id number] Manufacturer: Gerilyn Nestle FNDN   Lot number: [id number]     As of 07/28/2014    Status: Implanted             Screw   Screw Var Self Tap Skyline 68m - [id number] - Implanted  Neck   Inventory item: SCREW VAR SELF TAP SKYLINE 25M Model/Cat number: [id number]   Manufacturer: Lurena Nida DEPUY SPINE     As of 07/28/2014    Status: Implanted

## 2020-03-14 ENCOUNTER — Telehealth: Payer: Self-pay | Admitting: Pain Medicine

## 2020-03-14 ENCOUNTER — Encounter: Payer: Self-pay | Admitting: Pain Medicine

## 2020-03-16 NOTE — Telephone Encounter (Signed)
error 

## 2020-03-17 ENCOUNTER — Ambulatory Visit: Payer: Medicare Other | Admitting: Pain Medicine

## 2020-03-17 ENCOUNTER — Encounter: Payer: Self-pay | Admitting: Pain Medicine

## 2020-03-20 ENCOUNTER — Encounter: Payer: Self-pay | Admitting: Internal Medicine

## 2020-03-21 NOTE — Telephone Encounter (Signed)
I let patient know Melinda [name]'s response.

## 2020-03-21 NOTE — Telephone Encounter (Signed)
No appts available. Will have to see her tomorrow.

## 2020-03-22 ENCOUNTER — Encounter: Payer: Self-pay | Admitting: Internal Medicine

## 2020-03-22 ENCOUNTER — Other Ambulatory Visit: Payer: Self-pay

## 2020-03-22 ENCOUNTER — Ambulatory Visit (INDEPENDENT_AMBULATORY_CARE_PROVIDER_SITE_OTHER): Payer: Medicare Other | Admitting: Internal Medicine

## 2020-03-22 VITALS — BP 118/72 | HR 66 | Temp 97.8°F | Wt 227.0 lb

## 2020-03-22 DIAGNOSIS — N1832 Chronic kidney disease, stage 3b: Secondary | ICD-10-CM

## 2020-03-22 DIAGNOSIS — I2510 Atherosclerotic heart disease of native coronary artery without angina pectoris: Secondary | ICD-10-CM | POA: Diagnosis not present

## 2020-03-22 DIAGNOSIS — I2583 Coronary atherosclerosis due to lipid rich plaque: Secondary | ICD-10-CM | POA: Diagnosis not present

## 2020-03-22 DIAGNOSIS — R609 Edema, unspecified: Secondary | ICD-10-CM | POA: Diagnosis not present

## 2020-03-22 LAB — BASIC METABOLIC PANEL
BUN: 26 mg/dL — ABNORMAL HIGH (ref 6–23)
CO2: 31 mEq/L (ref 19–32)
Calcium: 8.6 mg/dL (ref 8.4–10.5)
Chloride: 103 mEq/L (ref 96–112)
Creatinine, Ser: 1.07 mg/dL (ref 0.40–1.20)
GFR: 51.87 mL/min — ABNORMAL LOW (ref >60.00)
Glucose, Bld: 163 mg/dL — ABNORMAL HIGH (ref 70–99)
Potassium: 4.2 mEq/L (ref 3.5–5.1)
Sodium: 140 mEq/L (ref 135–145)

## 2020-03-22 NOTE — Progress Notes (Signed)
Subjective:    Patient ID: Melinda [name], female    DOB: December 09, 1955, 64 y.o.   MRN: [id number]  HPI  Pt presents to the clinic today with c/o swelling in her legs and feet. This started 2 days ago. She reports the swelling was so bad that her calves were tight. She did not notice and redness, swelling or warmth. She denies chest pain, shortness of breath or cough. She reports she elevated her legs, work compression stockings and took OTC diuretics with significant improvement in symptoms.  Echo from 04/2019 showed no evidence of heart failure. She reports she monitors her salt intake. She does have CKD, last creatinine 1.43, GFR 37.13. She has an upcoming appt with nephrology in October.  Review of Systems      Past Medical History:  Diagnosis Date  . Amputation of great toe, right, traumatic ([place]) 05/30/2010  . Amputation of second toe, right, traumatic ([place]) 09/2017  . Anemia    years ago  . Anxiety   . Blue toes    2nd toe on right foot, will get appt.  . Bulging disc   . CAD (coronary artery disease) 2009   s/p stent to LAD  . 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  Ablations done.  Now has rare episodes  . Chicken pox   . Chronic fatigue   . Chronic kidney disease    "problem with kidney filtration"  . Coronary arteritis   . Degenerative disc disease   . Depression   . Diabetes mellitus without complication ([place])   . Facet joint disease   . Fibromyalgia   . Heart disease   . Hyperlipidemia   . IBS (irritable bowel syndrome)   . MCL deficiency, knee   . MRSA (methicillin resistant staph aureus) culture positive 2011   GREAT TOE RIGHT FOOT  . Neuropathy 04/18/2010  . Orthostatic hypotension 01/2020  . Peripheral neuropathy   . Renal insufficiency   . Restless leg syndrome   . Sepsis ([place]) 09/2013  . Sleep apnea 08/17/2003   uses CPAP, sleep study at [facility] (mild to moderate)  . Spinal stenosis     Current Outpatient Medications    Medication Sig Dispense Refill  . ACCU-CHEK AVIVA PLUS test strip USE TO CHECK BLOOD SUGAR ONE TIME A DAY 100 strip 5  . Accu-Chek Softclix Lancets lancets USE TO CHECK BLOOD SUGAR 1 TIME DAILY 100 each 2  . AMBULATORY NON FORMULARY MEDICATION Medication Name: CPAP MASK OF CHOICE FOR HOME DEVICE 1 each 0  . Ascorbic Acid (VITAMIN C) 1000 MG tablet Take 1,000 mg by mouth daily.    Marland Kitchen aspirin 81 MG tablet Take 81 mg by mouth daily.    Marland Kitchen b complex vitamins tablet Take 1 tablet by mouth in the morning.    Marland Kitchen buPROPion (WELLBUTRIN XL) 150 MG 24 hr tablet TAKE 1 TABLET BY MOUTH EVERY DAY (Patient taking differently: Take 150 mg by mouth daily. ) 90 tablet 2  . CALCIUM-MAGNESIUM-ZINC PO Take 3 capsules by mouth daily.    Marland Kitchen CINNAMON PO Take 1,200 mg by mouth daily.     . Coenzyme Q10 (CO Q 10) 100 MG CAPS Take 100 mg by mouth daily.     . fluticasone (FLONASE) 50 MCG/ACT nasal spray Place 2 sprays into both nostrils daily as needed for allergies.     Marland Kitchen glucosamine-chondroitin 500-400 MG tablet Take 2 tablets by mouth daily.     . Magnesium  500 MG TABS Take 500 mg by mouth at bedtime.     . Misc Natural Products (TART CHERRY ADVANCED PO) Take 1,200 mg by mouth at bedtime.     Marland Kitchen morphine (MS CONTIN) 15 MG 12 hr tablet Take 1 tablet (15 mg total) by mouth every 12 (twelve) hours. Must last 30 days. Do not break tablet 60 tablet 0  . [START ON 03/31/2020] morphine (MS CONTIN) 15 MG 12 hr tablet Take 1 tablet (15 mg total) by mouth every 12 (twelve) hours. Must last 30 days. Do not break tablet 60 tablet 0  . [START ON 04/30/2020] morphine (MS CONTIN) 15 MG 12 hr tablet Take 1 tablet (15 mg total) by mouth every 12 (twelve) hours. Must last 30 days. Do not break tablet 60 tablet 0  . Multiple Vitamin (MULTIVITAMIN) tablet Take 1 tablet by mouth daily.    . naloxone (NARCAN) 2 MG/2ML injection Inject 1 mL (1 mg total) into the muscle as needed for up to 2 doses (for opioid overdose). Inject content of syringe  into thigh muscle. Call 911. 2 Syringe 1  . nitroGLYCERIN (NITROSTAT) 0.4 MG SL tablet Place 1 tablet (0.4 mg total) under the tongue every 5 (five) minutes as needed for chest pain. Please keep upcoming appt in October. Thank you (Patient taking differently: Place 0.4 mg under the tongue every 5 (five) minutes as needed for chest pain. ) 25 tablet 1  . NONFORMULARY OR COMPOUNDED ITEM 212.8 mcg by Epidural Infusion route. Medtronic Neuromodulation pump Fentanyl 212.24mcg/day via IT pump, Baclofen, bupivicaine    . NONFORMULARY OR COMPOUNDED ITEM 88.77 mcg by Epidural Infusion route Continuous EPIDURAL. Medtronic Neuromodulation pump: Fentanyl, baclofen 88.77, bupivicane    . NONFORMULARY OR COMPOUNDED ITEM 5.918 mg by Epidural Infusion route Continuous EPIDURAL. Medtronic Neuromodulation pump: Fentanyl, Baclofen, Bupivicaine 5.918 mg    . Omega-3 Fatty Acids (FISH OIL) 1200 MG CAPS Take 1,200 mg by mouth in the morning.     Marland Kitchen OVER THE COUNTER MEDICATION Take 1 capsule by mouth daily. Schigandra Plus    . PARoxetine (PAXIL) 20 MG tablet TAKE ONE-HALF OF A TABLET BY MOUTH DAILY IN THE MORNING (Patient taking differently: Take 10 mg by mouth in the morning. ) 90 tablet 0  . polyethylene glycol (MIRALAX / GLYCOLAX) packet Take 17 g by mouth daily.     . pregabalin (LYRICA) 150 MG capsule Take 1 capsule (150 mg total) by mouth 3 (three) times daily. 270 capsule 1  . rosuvastatin (CRESTOR) 20 MG tablet TAKE 1 TABLET BY MOUTH EVERY DAY (Patient taking differently: Take 20 mg by mouth at bedtime. ) 90 tablet 3  . tiZANidine (ZANAFLEX) 4 MG tablet Take 1 tablet (4 mg total) by mouth 3 (three) times daily. 270 tablet 1  . Vitamin E 100 units TABS Take 100 Units by mouth daily.      No current facility-administered medications for this visit.    No Known Allergies  Family History  Problem Relation Age of Onset  . Lung cancer Mother   . Diabetes Father   . Heart disease Maternal Grandfather   . Diabetes  Paternal Grandmother   . Colon cancer Paternal Grandfather   . Stroke Neg Hx     Social History   Socioeconomic History  . Marital status: Significant Other    Spouse name: Not on file  . Number of children: Not on file  . Years of education: Not on file  . Highest education level: Not on  file  Occupational History  . Not on file  Tobacco Use  . Smoking status: Former Smoker    Packs/day: 1.50    Years: 32.00    Pack years: 48.00    Types: Cigarettes    Quit date: 07/22/2007    Years since quitting: 12.6  . Smokeless tobacco: Never Used  Vaping Use  . Vaping Use: Never used  Substance and Sexual Activity  . Alcohol use: Yes    Alcohol/week: 0.0 standard drinks    Comment: occ - Holidays  . Drug use: Yes    Comment: prescribed pain pump and oxy  . Sexual activity: Yes  Other Topics Concern  . Not on file  Social History Narrative   Lives at home with a partner. Independent at baseline   Social Determinants of Health   Financial Resource Strain:   . Difficulty of Paying Living Expenses: Not on file  Food Insecurity:   . Worried About Charity fundraiser in the Last Year: Not on file  . Ran Out of Food in the Last Year: Not on file  Transportation Needs:   . Lack of Transportation (Medical): Not on file  . Lack of Transportation (Non-Medical): Not on file  Physical Activity:   . Days of Exercise per Week: Not on file  . Minutes of Exercise per Session: Not on file  Stress:   . Feeling of Stress : Not on file  Social Connections:   . Frequency of Communication with Friends and Family: Not on file  . Frequency of Social Gatherings with Friends and Family: Not on file  . Attends Religious Services: Not on file  . Active Member of Clubs or Organizations: Not on file  . Attends Archivist Meetings: Not on file  . Marital Status: Not on file  Intimate Partner Violence:   . Fear of Current or Ex-Partner: Not on file  . Emotionally Abused: Not on file  .  Physically Abused: Not on file  . Sexually Abused: Not on file     Constitutional: Denies fever, malaise, fatigue, headache or abrupt weight changes.  Respiratory: Denies difficulty breathing, shortness of breath, cough or sputum production.   Cardiovascular: Pt reports swelling in legs and feet. Denies chest pain, chest tightness, palpitations or swelling in the hands.  Musculoskeletal: Denies decrease in range of motion, difficulty with gait, muscle pain or joint pain and swelling.  Skin: Denies redness, warmth, rashes, lesions or ulcercations.   No other specific complaints in a complete review of systems (except as listed in HPI above).  Objective:   Physical Exam  BP 118/72   Pulse 66   Temp 97.8 F (36.6 C) (Temporal)   Wt 227 lb (103 kg)   LMP  (LMP Unknown)   SpO2 98%   BMI 32.57 kg/m   Wt Readings from Last 3 Encounters:  03/11/20 222 lb 10.6 oz (101 kg)  03/08/20 223 lb 6.4 oz (101.3 kg)  03/04/20 220 lb (99.8 kg)    General: Appears her stated age, obese, in NAD. Skin: Warm, dry and intact. No redness or warmth noted. Cardiovascular: Normal rate and rhythm. S1,S2 noted.  No murmur, rubs or gallops noted. Trace BLE edema.  Pulmonary/Chest: Normal effort and positive vesicular breath sounds. No respiratory distress. No wheezes, rales or ronchi noted.  Musculoskeletal: No difficulty with gait.  Neurological: Alert and oriented.        BMET    Component Value Date/Time   NA 140 02/11/2020 1449  NA 135 (L) 06/30/2013 1203   K 4.6 02/11/2020 1449   K 4.1 06/30/2013 1203   CL 103 02/11/2020 1449   CL 102 06/30/2013 1203   CO2 28 02/11/2020 1449   CO2 32 06/30/2013 1203   GLUCOSE 96 02/11/2020 1449   GLUCOSE 143 (H) 06/30/2013 1203   BUN 31 (H) 02/11/2020 1449   BUN 19 (H) 06/30/2013 1203   CREATININE 1.43 (H) 02/11/2020 1449   CREATININE 0.89 06/30/2013 1203   CALCIUM 10.1 02/11/2020 1449   CALCIUM 9.7 06/30/2013 1203   GFRNONAA 41 (L) 02/02/2020  1519   GFRNONAA >60 06/30/2013 1203   GFRAA 47 (L) 02/02/2020 1519   GFRAA >60 06/30/2013 1203    Lipid Panel     Component Value Date/Time   CHOL 102 03/12/2019 1255   TRIG 158.0 (H) 03/12/2019 1255   HDL 34.30 (L) 03/12/2019 1255   CHOLHDL 3 03/12/2019 1255   VLDL 31.6 03/12/2019 1255   LDLCALC 36 03/12/2019 1255    CBC    Component Value Date/Time   WBC 6.6 02/02/2020 1519   RBC 4.64 02/02/2020 1519   HGB 13.6 02/02/2020 1519   HGB 14.0 11/12/2011 1136   HCT 39.7 02/02/2020 1519   HCT 40.7 11/12/2011 1136   PLT 202 02/02/2020 1519   PLT 274 11/12/2011 1136   MCV 85.6 02/02/2020 1519   MCV 86 11/12/2011 1136   MCH 29.3 02/02/2020 1519   MCHC 34.3 02/02/2020 1519   RDW 12.6 02/02/2020 1519   RDW 12.8 11/12/2011 1136   LYMPHSABS 1.8 08/14/2017 1950   LYMPHSABS 2.5 11/12/2011 1136   MONOABS 0.6 08/14/2017 1950   MONOABS 0.8 11/12/2011 1136   EOSABS 0.3 08/14/2017 1950   EOSABS 0.6 11/12/2011 1136   BASOSABS 0.0 08/14/2017 1950   BASOSABS 0.1 11/12/2011 1136    Hgb A1C Lab Results  Component Value Date   HGBA1C 6.2 (H) 02/02/2020           Assessment & Plan:   Peripheral Edema, CKD 3:  Swelling has improved with elevation, OTC diuretics, compression socks and monitoring salt intake Advised her to continue these interventions Repeat BMET today Will hold off on RX diuretics due to decreased kidney function She will follow up with nephrology at her scheduled appt Avoid NSAID's OTC, maximize water intake  Will follow up after labs, return precautions discussed  [name], NP This visit occurred during the SARS-CoV-2 public health emergency.  Safety protocols were in place, including screening questions prior to the visit, additional usage of staff PPE, and extensive cleaning of exam room while observing appropriate contact time as indicated for disinfecting solutions.

## 2020-03-22 NOTE — Patient Instructions (Signed)
Peripheral Edema  Peripheral edema is swelling that is caused by a buildup of fluid. Peripheral edema most often affects the lower legs, ankles, and feet. It can also develop in the arms, hands, and face. The area of the body that has peripheral edema will look swollen. It may also feel heavy or warm. Your clothes may start to feel tight. Pressing on the area may make a temporary dent in your skin. You may not be able to move your swollen arm or leg as much as usual. There are many causes of peripheral edema. It can happen because of a complication of other conditions such as congestive heart failure, kidney disease, or a problem with your blood circulation. It also can be a side effect of certain medicines or because of an infection. It often happens to women during pregnancy. Sometimes, the cause is not known. Follow these instructions at home: Managing pain, stiffness, and swelling   Raise (elevate) your legs while you are sitting or lying down.  Move around often to prevent stiffness and to lessen swelling.  Do not sit or stand for long periods of time.  Wear support stockings as told by your health care provider. Medicines  Take over-the-counter and prescription medicines only as told by your health care provider.  Your health care provider may prescribe medicine to help your body get rid of excess water (diuretic). General instructions  Pay attention to any changes in your symptoms.  Follow instructions from your health care provider about limiting salt (sodium) in your diet. Sometimes, eating less salt may reduce swelling.  Moisturize skin daily to help prevent skin from cracking and draining.  Keep all follow-up visits as told by your health care provider. This is important. Contact a health care provider if you have:  A fever.  Edema that starts suddenly or is getting worse, especially if you are pregnant or have a medical condition.  Swelling in only one leg.  Increased  swelling, redness, or pain in one or both of your legs.  Drainage or sores at the area where you have edema. Get help right away if you:  Develop shortness of breath, especially when you are lying down.  Have pain in your chest or abdomen.  Feel weak.  Feel faint. Summary  Peripheral edema is swelling that is caused by a buildup of fluid. Peripheral edema most often affects the lower legs, ankles, and feet.  Move around often to prevent stiffness and to lessen swelling. Do not sit or stand for long periods of time.  Pay attention to any changes in your symptoms.  Contact a health care provider if you have edema that starts suddenly or is getting worse, especially if you are pregnant or have a medical condition.  Get help right away if you develop shortness of breath, especially when lying down. This information is not intended to replace advice given to you by your health care provider. Make sure you discuss any questions you have with your health care provider. Document Revised: 03/12/2018 Document Reviewed: 03/12/2018 Elsevier Patient Education  2020 Elsevier Inc.  

## 2020-03-23 ENCOUNTER — Encounter: Payer: Self-pay | Admitting: Student in an Organized Health Care Education/Training Program

## 2020-03-23 ENCOUNTER — Ambulatory Visit
Payer: Medicare Other | Attending: Pain Medicine | Admitting: Student in an Organized Health Care Education/Training Program

## 2020-03-23 VITALS — BP 145/84 | HR 73 | Temp 97.3°F | Resp 16 | Ht 70.0 in | Wt 226.0 lb

## 2020-03-23 DIAGNOSIS — I2583 Coronary atherosclerosis due to lipid rich plaque: Secondary | ICD-10-CM

## 2020-03-23 DIAGNOSIS — G894 Chronic pain syndrome: Secondary | ICD-10-CM | POA: Insufficient documentation

## 2020-03-23 DIAGNOSIS — G96198 Other disorders of meninges, not elsewhere classified: Secondary | ICD-10-CM | POA: Diagnosis not present

## 2020-03-23 DIAGNOSIS — M961 Postlaminectomy syndrome, not elsewhere classified: Secondary | ICD-10-CM | POA: Diagnosis not present

## 2020-03-23 DIAGNOSIS — M5137 Other intervertebral disc degeneration, lumbosacral region: Secondary | ICD-10-CM | POA: Insufficient documentation

## 2020-03-23 DIAGNOSIS — M5441 Lumbago with sciatica, right side: Secondary | ICD-10-CM | POA: Diagnosis not present

## 2020-03-23 DIAGNOSIS — M47894 Other spondylosis, thoracic region: Secondary | ICD-10-CM | POA: Diagnosis not present

## 2020-03-23 DIAGNOSIS — I2510 Atherosclerotic heart disease of native coronary artery without angina pectoris: Secondary | ICD-10-CM

## 2020-03-23 DIAGNOSIS — M47814 Spondylosis without myelopathy or radiculopathy, thoracic region: Secondary | ICD-10-CM | POA: Insufficient documentation

## 2020-03-23 DIAGNOSIS — G8929 Other chronic pain: Secondary | ICD-10-CM | POA: Diagnosis not present

## 2020-03-23 MED ORDER — OXYCODONE-ACETAMINOPHEN 5-325 MG PO TABS
1.0000 | ORAL_TABLET | Freq: Three times a day (TID) | ORAL | 0 refills | Status: DC | PRN
Start: 2020-03-23 — End: 2020-04-12

## 2020-03-23 NOTE — Progress Notes (Signed)
Safety precautions to be maintained throughout the outpatient stay will include: orient to surroundings, keep bed in low position, maintain call bell within reach at all times, provide assistance with transfer out of bed and ambulation. Staples removed from right lower abdomen.  Steri strips applied.  Site with small amt of redness at staple insertion sites.  No drainage noted.  Staples removed from low back with steri strips applied.  No redness or drainage noted.  Instructed patient to notify us for any fever, increased redness or drainage.  Dr Holley Raring visualized incision sites.

## 2020-03-23 NOTE — Patient Instructions (Addendum)
Call us for any increased redness, drainage, heat from incision or fever, or for any questions or concerns.   Bring back Morphine to office for desposal

## 2020-03-23 NOTE — Progress Notes (Signed)
PROVIDER NOTE: Information contained herein reflects review and annotations entered in association with encounter. Interpretation of such information and data should be left to medically-trained personnel. Information provided to patient can be located elsewhere in the medical record under "Patient Instructions". Document created using STT-dictation technology, any transcriptional errors that may result from process are unintentional.    Patient: Melinda [name]  Service Category: E/M  Provider: Gillis Santa, MD  DOB: Jun 07, 1956  DOS: 03/23/2020  Specialty: Interventional Pain Management  MRN: [id number]  Setting: Ambulatory outpatient  PCP: Jearld Fenton, NP  Type: Established Patient    Referring Provider: Jearld Fenton, NP  Location: Office  Delivery: Face-to-face     Primary Reason(s) for Visit: Encounter for post-procedure evaluation of chronic illness with mild to moderate exacerbation CC: Back Pain (low)  HPI  Melinda [name] is a 64 y.o. year old, female patient, who comes today for a post-procedure evaluation. She has Chronic pain syndrome; CAD (coronary artery disease); Pure hypercholesterolemia; Type 2 diabetes mellitus with diabetic mononeuropathy, without long-term current use of insulin ([place]); Failed back surgical syndrome; Failed cervical surgery syndrome (ACDF); Chronic neck pain; Chronic low back pain (Bilateral) (L>R) w/ sciatica (Right); Epidural fibrosis; Cervical spondylosis; Neuropathic pain; Lumbar facet syndrome; Fibromyalgia; Presence of functional implant (Medtronic programmable intrathecal pump) (Right abdominal area); Presence of intrathecal pump (Medtronic intrathecal programmable pump) (40 mL pump); Opioid-induced constipation (OIC); Chronic knee pain (Primary Area of Pain) (Right); Osteoarthritis of knee (Left); Anxiety and depression; Long term current use of opiate analgesic; Long term prescription opiate use; Opiate use; Encounter for adjustment or management of infusion  pump; Osteoarthritis, multiple sites; Chronic musculoskeletal pain; Osteoarthritis of knee (Bilateral) (R>L); Other secondary hypertension; OSA (obstructive sleep apnea); Hip pain, acute, left; Lumbar spondylosis; Degenerative arthritis of knee (Bilateral); Lumbar spinal stenosis w/ neurogenic claudication; Abnormal MRI, lumbar spine (04/01/2018); Lumbar facet hypertrophy (Multilevel); Lumbar foraminal stenosis; Lumbar lateral recess stenosis; DDD (degenerative disc disease), lumbar; Chronic right-sided low back pain w/o sciatica from IT catheter anchor.; CKD (chronic kidney disease) stage 3, GFR 30-59 ml/min; Spondylosis without myelopathy or radiculopathy, lumbosacral region; Diabetes 1.5, managed as type 2 ([place]); Atherosclerotic heart disease of native coronary artery without angina pectoris; Cervical spondylosis; Chronic low back pain (Bilateral) w/o sciatica; DDD (degenerative disc disease), lumbosacral; Sensation disturbance of skin; Allodynia; Hyperalgesia; Thoracic facet hypertrophy/arthropathy (Multilevel) (Bilateral); Thoracic facet syndrome (Multilevel) (Bilateral); Encounter for interrogation of infusion pump; Malfunction of intrathecal infusion pump; and Malposition of intrathecal infusion catheter on their problem list. Her primarily concern today is the Back Pain (low)  Pain Assessment: Location: Lower Back Radiating: denies Onset: More than a month ago Duration: Chronic pain Quality:   Severity: 2 /10 (subjective, self-reported pain score)  Effect on ADL: limits activities Timing: Constant Modifying factors: pain pump, medication BP: (!) 145/84  HR: 73  Melinda [name] comes in today for post-op evaluation after her intrathecal pump revision on 03/11/2020.  She is here to have her staples removed.  Further details on both, my assessment(s), as well as the proposed treatment plan, please see below.  Post-operative Assessment  Intra-procedural problems/complications: Unable to assess as  this was done by my partner, Dr. Dossie Arbour         Reported side-effects: None.        Post-surgical adverse reactions or complications: None reported         Laboratory Chemistry Profile   Renal Lab Results  Component Value Date   BUN 26 (H) 03/22/2020   CREATININE 1.07 03/22/2020  GFR 51.87 (L) 03/22/2020   GFRAA 47 (L) 02/02/2020   GFRNONAA 41 (L) 02/02/2020   SPECGRAV 1.020 03/12/2019   PHUR 6.0 03/12/2019   PROTEINUR NEGATIVE 02/02/2020     Electrolytes Lab Results  Component Value Date   NA 140 03/22/2020   K 4.2 03/22/2020   CL 103 03/22/2020   CALCIUM 8.6 03/22/2020   MG 2.1 06/30/2013     Hepatic Lab Results  Component Value Date   AST 20 03/12/2019   ALT 18 03/12/2019   ALBUMIN 4.3 03/12/2019   ALKPHOS 99 03/12/2019   LIPASE 11 10/04/2013     ID Lab Results  Component Value Date   HIV Non Reactive 02/22/2017   SARSCOV2NAA NEGATIVE 03/09/2020   STAPHAUREUS POSITIVE (A) 12/10/2018   MRSAPCR NEGATIVE 12/10/2018   HCVAB NEGATIVE 05/18/2016     Bone Lab Results  Component Value Date   VD25OH 34.99 03/12/2019     Endocrine Lab Results  Component Value Date   GLUCOSE 163 (H) 03/22/2020   GLUCOSEU NEGATIVE 02/02/2020   HGBA1C 6.2 (H) 02/02/2020   TSH 1.965 02/02/2020     Neuropathy Lab Results  Component Value Date   VITAMINB12 253 03/12/2019   FOLATE >20.0 10/04/2013   HGBA1C 6.2 (H) 02/02/2020   HIV Non Reactive 02/22/2017     CNS Lab Results  Component Value Date   COLORCSF COLORLESS 09/11/2015   APPEARCSF CLEAR 09/11/2015   RBCCOUNTCSF 400 (H) 09/11/2015   WBCCSF 1 09/11/2015   LYMPHSCSF RARE 10/06/2013   LYMPHSCSF RARE 10/06/2013   PROTEINCSF 48 (H) 09/11/2015   GLUCCSF 71 (H) 09/11/2015     Inflammation (CRP: Acute  ESR: Chronic) Lab Results  Component Value Date   CRP 1 05/06/2018   ESRSEDRATE 20 05/06/2018   LATICACIDVEN 0.8 08/14/2017     Rheumatology No results found for: RF, ANA, LABURIC, URICUR, LYMEIGGIGMAB,  LYMEABIGMQN, HLAB27   Coagulation Lab Results  Component Value Date   INR 1.12 09/11/2015   LABPROT 14.6 09/11/2015   APTT 31 09/11/2015   PLT 202 02/02/2020   DDIMER 3.85 (H) 10/05/2013     Cardiovascular Lab Results  Component Value Date   BNP 35 04/03/2019   TROPONINI <0.03 09/11/2015   HGB 13.6 02/02/2020   HCT 39.7 02/02/2020     Screening Lab Results  Component Value Date   SARSCOV2NAA NEGATIVE 03/09/2020   COVIDSOURCE NASOPHARYNGEAL 12/09/2018   STAPHAUREUS POSITIVE (A) 12/10/2018   MRSAPCR NEGATIVE 12/10/2018   HCVAB NEGATIVE 05/18/2016   HIV Non Reactive 02/22/2017     Cancer No results found for: CEA, CA125, LABCA2   Allergens No results found for: ALMOND, APPLE, ASPARAGUS, AVOCADO, BANANA, BARLEY, BASIL, BAYLEAF, GREENBEAN, LIMABEAN, WHITEBEAN, BEEFIGE, REDBEET, BLUEBERRY, BROCCOLI, CABBAGE, MELON, CARROT, CASEIN, CASHEWNUT, CAULIFLOWER, CELERY     Note: Lab results reviewed.  Recent Diagnostic Imaging Results  DG C-Arm 1-60 Min-No Report Fluoroscopy was utilized by the requesting physician.  No radiographic  interpretation.   Complexity Note: Imaging results reviewed. Results shared with Melinda [name], using Layman's terms.                               Meds   Current Outpatient Medications:  .  ACCU-CHEK AVIVA PLUS test strip, USE TO CHECK BLOOD SUGAR ONE TIME A DAY, Disp: 100 strip, Rfl: 5 .  Accu-Chek Softclix Lancets lancets, USE TO CHECK BLOOD SUGAR 1 TIME DAILY, Disp: 100 each, Rfl: 2 .  AMBULATORY NON FORMULARY MEDICATION, Medication Name: CPAP MASK OF CHOICE FOR HOME DEVICE, Disp: 1 each, Rfl: 0 .  Ascorbic Acid (VITAMIN C) 1000 MG tablet, Take 1,000 mg by mouth daily., Disp: , Rfl:  .  aspirin 81 MG tablet, Take 81 mg by mouth daily., Disp: , Rfl:  .  b complex vitamins tablet, Take 1 tablet by mouth in the morning., Disp: , Rfl:  .  buPROPion (WELLBUTRIN XL) 150 MG 24 hr tablet, TAKE 1 TABLET BY MOUTH EVERY DAY (Patient taking differently:  Take 150 mg by mouth daily. ), Disp: 90 tablet, Rfl: 2 .  Caffeine-Magnesium Salicylate (DIUREX PO), Take 1 tablet by mouth daily as needed., Disp: , Rfl:  .  CALCIUM-MAGNESIUM-ZINC PO, Take 3 capsules by mouth daily., Disp: , Rfl:  .  CINNAMON PO, Take 1,200 mg by mouth daily. , Disp: , Rfl:  .  Coenzyme Q10 (CO Q 10) 100 MG CAPS, Take 100 mg by mouth daily. , Disp: , Rfl:  .  fluticasone (FLONASE) 50 MCG/ACT nasal spray, Place 2 sprays into both nostrils daily as needed for allergies. , Disp: , Rfl:  .  glucosamine-chondroitin 500-400 MG tablet, Take 2 tablets by mouth daily. , Disp: , Rfl:  .  Magnesium 500 MG TABS, Take 500 mg by mouth at bedtime. , Disp: , Rfl:  .  Misc Natural Products (TART CHERRY ADVANCED PO), Take 1,200 mg by mouth at bedtime. , Disp: , Rfl:  .  morphine (MS CONTIN) 15 MG 12 hr tablet, Take 1 tablet (15 mg total) by mouth every 12 (twelve) hours. Must last 30 days. Do not break tablet, Disp: 60 tablet, Rfl: 0 .  [START ON 03/31/2020] morphine (MS CONTIN) 15 MG 12 hr tablet, Take 1 tablet (15 mg total) by mouth every 12 (twelve) hours. Must last 30 days. Do not break tablet, Disp: 60 tablet, Rfl: 0 .  [START ON 04/30/2020] morphine (MS CONTIN) 15 MG 12 hr tablet, Take 1 tablet (15 mg total) by mouth every 12 (twelve) hours. Must last 30 days. Do not break tablet, Disp: 60 tablet, Rfl: 0 .  Multiple Vitamin (MULTIVITAMIN) tablet, Take 1 tablet by mouth daily., Disp: , Rfl:  .  naloxone (NARCAN) 2 MG/2ML injection, Inject 1 mL (1 mg total) into the muscle as needed for up to 2 doses (for opioid overdose). Inject content of syringe into thigh muscle. Call 911., Disp: 2 Syringe, Rfl: 1 .  nitroGLYCERIN (NITROSTAT) 0.4 MG SL tablet, Place 1 tablet (0.4 mg total) under the tongue every 5 (five) minutes as needed for chest pain. Please keep upcoming appt in October. Thank you (Patient taking differently: Place 0.4 mg under the tongue every 5 (five) minutes as needed for chest pain. ),  Disp: 25 tablet, Rfl: 1 .  NONFORMULARY OR COMPOUNDED ITEM, 212.8 mcg by Epidural Infusion route. Medtronic Neuromodulation pump  Fentanyl 1,000.0 mcg/ml Baclofen 240.0 mcg/ml Bupivacaine 20.0 mg/ml Total dose 8.5 mcg/hr, Disp: , Rfl:  .  Omega-3 Fatty Acids (FISH OIL) 1200 MG CAPS, Take 1,200 mg by mouth in the morning. , Disp: , Rfl:  .  OVER THE COUNTER MEDICATION, Take 1 capsule by mouth daily. Schigandra Plus, Disp: , Rfl:  .  PARoxetine (PAXIL) 20 MG tablet, TAKE ONE-HALF OF A TABLET BY MOUTH DAILY IN THE MORNING (Patient taking differently: Take 10 mg by mouth in the morning. ), Disp: 90 tablet, Rfl: 0 .  polyethylene glycol (MIRALAX / GLYCOLAX) packet, Take 17 g by mouth  daily. , Disp: , Rfl:  .  pregabalin (LYRICA) 150 MG capsule, Take 1 capsule (150 mg total) by mouth 3 (three) times daily., Disp: 270 capsule, Rfl: 1 .  rosuvastatin (CRESTOR) 20 MG tablet, TAKE 1 TABLET BY MOUTH EVERY DAY (Patient taking differently: Take 20 mg by mouth at bedtime. ), Disp: 90 tablet, Rfl: 3 .  tiZANidine (ZANAFLEX) 4 MG tablet, Take 1 tablet (4 mg total) by mouth 3 (three) times daily., Disp: 270 tablet, Rfl: 1 .  Vitamin E 100 units TABS, Take 100 Units by mouth daily. , Disp: , Rfl:  .  NONFORMULARY OR COMPOUNDED ITEM, 88.77 mcg by Epidural Infusion route Continuous EPIDURAL. Medtronic Neuromodulation pump: Fentanyl, baclofen 88.77, bupivicane (Patient not taking: Reported on 03/23/2020), Disp: , Rfl:  .  NONFORMULARY OR COMPOUNDED ITEM, 5.918 mg by Epidural Infusion route Continuous EPIDURAL. Medtronic Neuromodulation pump: Fentanyl, Baclofen, Bupivicaine 5.918 mg (Patient not taking: Reported on 03/23/2020), Disp: , Rfl:  .  oxyCODONE-acetaminophen (PERCOCET) 5-325 MG tablet, Take 1 tablet by mouth every 8 (eight) hours as needed for up to 21 days for severe pain., Disp: 60 tablet, Rfl: 0  ROS  Constitutional: Denies any fever or chills Gastrointestinal: No reported hemesis, hematochezia, vomiting, or  acute GI distress Musculoskeletal: Denies any acute onset joint swelling, redness, loss of ROM, or weakness Neurological: No reported episodes of acute onset apraxia, aphasia, dysarthria, agnosia, amnesia, paralysis, loss of coordination, or loss of consciousness  Allergies  Melinda [name] has No Known Allergies.  Botines  Drug: Melinda [name]  reports current drug use. Alcohol:  reports current alcohol use. Tobacco:  reports that she quit smoking about 12 years ago. Her smoking use included cigarettes. She has a 48.00 pack-year smoking history. She has never used smokeless tobacco. 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[place]) (05/30/2010), Amputation of second toe, right, traumatic ([place]) (09/2017), Anemia, Anxiety, Blue toes, Bulging disc, CAD (coronary artery disease) (2009), Cardiac arrhythmia due to congenital heart disease, Chicken pox, Chronic fatigue, Chronic kidney disease, Coronary arteritis, Degenerative disc disease, Depression, Diabetes mellitus without complication ([place]), Facet joint disease, Fibromyalgia, Heart disease, Hyperlipidemia, IBS (irritable bowel syndrome), MCL deficiency, knee, MRSA (methicillin resistant staph aureus) culture positive (2011), Neuropathy (04/18/2010), Orthostatic hypotension (01/2020), Peripheral neuropathy, Renal insufficiency, Restless leg syndrome, Sepsis ([place]) (09/2013), Sleep apnea (08/17/2003), and Spinal stenosis. Surgical: Melinda [name]  has a past surgical history that includes Foot surgery (Right); Hand surgery (Left); Gallbladder surgery; Ablation; Ablation; HEART STENT (2009); HAND SURGEY (Left); Foot surgery (Bilateral); Foot surgery (Right); Infusion pump implantation; Back surgery; Cholecystectomy (2003); Anterior cervical decomp/discectomy fusion (N/A, 07/28/2014); Carpal tunnel release (Right); Eye surgery (Bilateral, 2013); Amputation toe (Right, 02/01/2017); Irrigation and debridement foot (Right, 02/23/2017); Toe  Surgery; Hammer toe surgery (Right, 10/17/2017); Intrathecal pump revision (N/A, 04/25/2018); Intrathecal pump revision (Right, 04/25/2018); Coronary angioplasty (2008); Pain pump revision (N/A, 08/15/2018); Intrathecal pump revision (N/A, 12/12/2018); and Intrathecal pump revision (N/A, 03/11/2020). Family: family history includes Colon cancer in her paternal grandfather; Diabetes in her father and paternal grandmother; Heart disease in her maternal grandfather; Lung cancer in her mother.  Postop Exam  General appearance: Afebrile. Well nourished, well developed, and well hydrated. In no apparent acute distress. Vitals:   03/23/20 1306  BP: (!) 145/84  Pulse: 73  Resp: 16  Temp: (!) 97.3 F (36.3 C)  SpO2: 100%  Weight: 226 lb (102.5 kg)  Height: $Remove'5\' 10"'FmcyRpC$  (1.778 m)   BMI Assessment: Estimated body mass index  is 32.43 kg/m as calculated from the following:   Height as of this encounter: $RemoveBeforeD'5\' 10"'[id number]$  (1.778 m).   Weight as of this encounter: 226 lb (102.5 kg). Surgical site: Wound is healing well. No redness, tenderness, discharge, abnormal odors, or any other evidence of infection or complications.   Assessment  Primary Diagnosis & Pertinent Problem List: The primary encounter diagnosis was Chronic pain syndrome. Diagnoses of Failed back surgical syndrome, Epidural fibrosis, DDD (degenerative disc disease), lumbosacral, Chronic low back pain (Bilateral) (L>R) w/ sciatica (Right), Thoracic facet hypertrophy/arthropathy (Multilevel) (Bilateral), and Thoracic facet syndrome (Multilevel) (Bilateral) were also pertinent to this visit.  Plan of Care  Melinda [name] has a current medication list which includes the following long-term medication(s): bupropion, fluticasone, naloxone, nitroglycerin, paroxetine, pregabalin, rosuvastatin, and tizanidine.  Patient follows up today for her postoperative visit, to have her staples removed and to assess her surgical site.  Staples were removed by nursing staff,  Nonnie Done.  Steri-Strips were applied.  Surgical site healing normally.  Of note, patient is not finding analgesic benefit with morphine.  She is requesting to return back to her previous analgesic regimen which was oxycodone 5 mg 3 times a day as needed.  She states that she has also been taking Tylenol to help with her pain.  We discussed transitioning her to Percocet 5 mg 3 times daily as needed as this does have acetaminophen which could be an analgesic adjunct for her.  Instructed the patient to not exceed a daily dose of 2 g of acetaminophen.  Patient endorsed understanding.  She will follow-up with Dr. Dossie Arbour next month.  PMP checked and reviewed.  Patient instructed to bring her morphine tablets at her next visit so that they can be discarded.  Pharmacotherapy (Medications Ordered): Meds ordered this encounter  Medications  . oxyCODONE-acetaminophen (PERCOCET) 5-325 MG tablet    Sig: Take 1 tablet by mouth every 8 (eight) hours as needed for up to 21 days for severe pain.    Dispense:  60 tablet    Refill:  0    Chronic Pain. (STOP Act - Not applicable). Fill one day early if closed on scheduled refill date.    Follow-up plan:   Return for Keep sch. appt.   Recent Visits Date Type Provider Dept  02/23/20 Procedure visit [name], MD [facility]  Showing recent visits within past 90 days and meeting all other requirements Today's Visits Date Type Provider Dept  03/23/20 Office Visit Gillis Santa, MD [facility]  Showing today's visits and meeting all other requirements Future Appointments Date Type Provider Dept  04/12/20 Appointment [name], MD [facility]  05/24/20 Appointment [name], MD [facility]  Showing future appointments within next 90 days and meeting all other requirements  I discussed the assessment and treatment plan with the patient. The patient was provided an opportunity to ask questions and all  were answered. The patient agreed with the plan and demonstrated an understanding of the instructions.  Patient advised to call back or seek an in-person evaluation if the symptoms or condition worsens.  Duration of encounter: 20 minutes.  Note by: Gillis Santa, MD Date: 03/23/2020; Time: 1:46 PM

## 2020-03-25 ENCOUNTER — Ambulatory Visit (INDEPENDENT_AMBULATORY_CARE_PROVIDER_SITE_OTHER): Payer: Medicare Other | Admitting: Primary Care

## 2020-03-25 ENCOUNTER — Other Ambulatory Visit: Payer: Self-pay

## 2020-03-25 ENCOUNTER — Encounter: Payer: Self-pay | Admitting: Primary Care

## 2020-03-25 DIAGNOSIS — G4733 Obstructive sleep apnea (adult) (pediatric): Secondary | ICD-10-CM

## 2020-03-25 NOTE — Patient Instructions (Signed)
-   Referral placed for new full face mask with DME company - Advised not to drive if experiencing excessive daytime fatigue or somnolence   Follow Up Instructions:  - 6 months follow-up in [place] office

## 2020-03-25 NOTE — Progress Notes (Signed)
Virtual Visit via Telephone Note  I connected with Melinda [name] on 03/25/20 at  1:30 PM EDT by telephone and verified that I am speaking with the correct person using two identifiers.  Location: Patient: Home Provider: Office    I discussed the limitations, risks, security and privacy concerns of performing an evaluation and management service by telephone and the availability of in person appointments. I also discussed with the patient that there may be a patient responsible charge related to this service. The patient expressed understanding and agreed to proceed.   History of Present Illness: 64 year old female, former smoker.  Past medical history significant for obstructive sleep apnea, daytime sleepiness. Former Dr. Ashby Dawes, last seen in November 2019. Maintained on auto CPAP 9-20cm h20. She has been on Provigil and Nuvigil in the past which have subsequently been weaned off. Chronic pain managed by pain clinic.   02/03/2020 Patient presents today for follow-up. She has not been seen in over 2.5 years. She had her own CPAP machine which stopped working. She has been without machine for 2 weeks. Hue[facility] Romans is sending her a new CPAP. She wears full nasal mask. Normal bedtime varies. She generally wakes up between 8-8:30am. She will sometimes fall asleep in the chair. She does some pursed lip breathing at night, she has had a chin strap with her mask but this didn't particularly help. She sleeps well. She sleeps on her back. CPAP does help her sleep. She quit smoking.   03/25/2020- Interim hx Patient contacted today for 6-8 week follow-up. Doing well, sleeping well and compliant with CPAP use. She uses nasal mask and has tried chin strap but still experiences airleak. States that regardless of what she does she still opens her mouth which wakes up her partner. She would like to try a full face mask. DME company is [employer].    [employer] download 02/20/20-03/20/20: Usage 25/30 (83%)  Pressure 9-20cm  h20 (12.2- 95%) Airleak 17.5L/min (95%) AHI 3.1   Review and summary of old records, and previous testing: -Sleep study from 05/20/2003: AHI of 11, consistent with mild obstructive sleep apnea. Titration study from 08/17/2003, patient required a CPAP of 9, she was recommended to be on AutoPap of 5-18, though 9 appeared to be adequate. On my review of the tracings, it would appear that the patient had a residual AHI of only 3 with a CPAP of 9, indicating a level of 9 would be adequate. -Auto titrate study October 2014: My review of the tracings: CPAP with a low of 9 and a high of 20, showed very well-controlled apneas. Residual AHI of 1.7 with a median level of 10 -Review of the most recent charting shows that the patient as of 05/20/2014 was doing well on a ResMed S9 through [place], [place]. 95th percentile was 11 with 100% compliance. Her residual AHI was 1.7. -In the past for CPAP level is been adjusted to between a level of 12 and 15. She is a history of smoking which she quit, history of restless leg syndrome-she has been on Nuvigil in the past    Observations/Objective:  - No overt shortness of breath, wheezing or cough  Assessment and Plan:  OSA: - Patient is 83% compliant with CPAP, having trouble with airleaks and opening her mouth at night with nasal mask - Auto CPAP 9-20cm; AHI 3.1 - Referral placed for new full face mask with [place] patient not to drive if experiencing excessive daytime fatigue or somnolence   Follow Up Instructions:  -  6 months follow-up in [place] office    I discussed the assessment and treatment plan with the patient. The patient was provided an opportunity to ask questions and all were answered. The patient agreed with the plan and demonstrated an understanding of the instructions.   The patient was advised to call back or seek an in-person evaluation if the symptoms worsen or if the condition fails to improve as anticipated.  I  provided 18 minutes of non-face-to-face time during this encounter.   Martyn Ehrich, NP

## 2020-03-25 NOTE — Progress Notes (Signed)
Reviewed and agree with assessment/plan.   Chesley Mires, MD [facility] Pulmonary/Critical Care 03/25/2020, 2:10 PM Pager:  [phone]

## 2020-03-31 ENCOUNTER — Encounter: Payer: Self-pay | Admitting: Internal Medicine

## 2020-04-01 ENCOUNTER — Encounter: Payer: Self-pay | Admitting: Intensive Care

## 2020-04-01 ENCOUNTER — Other Ambulatory Visit: Payer: Self-pay

## 2020-04-01 ENCOUNTER — Emergency Department
Admission: EM | Admit: 2020-04-01 | Discharge: 2020-04-01 | Disposition: A | Discharge status: Home / Self Care | Payer: Medicare Other | Attending: Emergency Medicine | Admitting: Emergency Medicine

## 2020-04-01 ENCOUNTER — Emergency Department: Payer: Medicare Other

## 2020-04-01 DIAGNOSIS — Z5321 Procedure and treatment not carried out due to patient leaving prior to being seen by health care provider: Secondary | ICD-10-CM | POA: Diagnosis not present

## 2020-04-01 DIAGNOSIS — X58XXXA Exposure to other specified factors, initial encounter: Secondary | ICD-10-CM | POA: Diagnosis not present

## 2020-04-01 DIAGNOSIS — R509 Fever, unspecified: Secondary | ICD-10-CM | POA: Diagnosis not present

## 2020-04-01 DIAGNOSIS — S91302A Unspecified open wound, left foot, initial encounter: Secondary | ICD-10-CM | POA: Diagnosis not present

## 2020-04-01 LAB — URINALYSIS, COMPLETE (UACMP) WITH MICROSCOPIC
Bacteria, UA: NONE SEEN
Bilirubin Urine: NEGATIVE
Glucose, UA: NEGATIVE mg/dL
Hgb urine dipstick: NEGATIVE
Ketones, ur: NEGATIVE mg/dL
Leukocytes,Ua: NEGATIVE
Nitrite: NEGATIVE
Protein, ur: NEGATIVE mg/dL
Specific Gravity, Urine: 1.027 (ref 1.005–1.030)
Squamous Epithelial / HPF: NONE SEEN (ref 0–5)
pH: 5 (ref 5.0–8.0)

## 2020-04-01 LAB — CBC WITH DIFFERENTIAL/PLATELET
Abs Immature Granulocytes: 0.02 10*3/uL (ref 0.00–0.07)
Basophils Absolute: 0.1 10*3/uL (ref 0.0–0.1)
Basophils Relative: 1 %
Eosinophils Absolute: 0.6 10*3/uL — ABNORMAL HIGH (ref 0.0–0.5)
Eosinophils Relative: 9 %
HCT: 32.7 % — ABNORMAL LOW (ref 36.0–46.0)
Hemoglobin: 10.5 g/dL — ABNORMAL LOW (ref 12.0–15.0)
Immature Granulocytes: 0 %
Lymphocytes Relative: 26 %
Lymphs Abs: 1.6 10*3/uL (ref 0.7–4.0)
MCH: 28.9 pg (ref 26.0–34.0)
MCHC: 32.1 g/dL (ref 30.0–36.0)
MCV: 90.1 fL (ref 80.0–100.0)
Monocytes Absolute: 0.7 10*3/uL (ref 0.1–1.0)
Monocytes Relative: 11 %
Neutro Abs: 3.4 10*3/uL (ref 1.7–7.7)
Neutrophils Relative %: 53 %
Platelets: 199 10*3/uL (ref 150–400)
RBC: 3.63 MIL/uL — ABNORMAL LOW (ref 3.87–5.11)
RDW: 12.9 % (ref 11.5–15.5)
WBC: 6.3 10*3/uL (ref 4.0–10.5)
nRBC: 0 % (ref 0.0–0.2)

## 2020-04-01 LAB — COMPREHENSIVE METABOLIC PANEL
ALT: 14 U/L (ref 0–44)
AST: 20 U/L (ref 15–41)
Albumin: 4 g/dL (ref 3.5–5.0)
Alkaline Phosphatase: 82 U/L (ref 38–126)
Anion gap: 11 (ref 5–15)
BUN: 29 mg/dL — ABNORMAL HIGH (ref 8–23)
CO2: 28 mmol/L (ref 22–32)
Calcium: 8.8 mg/dL — ABNORMAL LOW (ref 8.9–10.3)
Chloride: 103 mmol/L (ref 98–111)
Creatinine, Ser: 1.02 mg/dL — ABNORMAL HIGH (ref 0.44–1.00)
GFR calc Af Amer: >60 mL/min (ref >60)
GFR calc non Af Amer: 59 mL/min — ABNORMAL LOW (ref >60)
Glucose, Bld: 97 mg/dL (ref 70–99)
Potassium: 4 mmol/L (ref 3.5–5.1)
Sodium: 142 mmol/L (ref 135–145)
Total Bilirubin: 0.7 mg/dL (ref 0.3–1.2)
Total Protein: 7.4 g/dL (ref 6.5–8.1)

## 2020-04-01 LAB — LACTIC ACID, PLASMA: Lactic Acid, Venous: 0.9 mmol/L (ref 0.5–1.9)

## 2020-04-01 NOTE — ED Triage Notes (Signed)
Patient presents with wound on left foot, second digit that started Tuesday. C/o fever, redness, blister and oozing from site. Hx diabetes and MRSA. 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

## 2020-04-01 NOTE — ED Notes (Signed)
Pt reports she is not going to wait and left waiting room with steady gait

## 2020-04-02 ENCOUNTER — Encounter: Payer: Self-pay | Admitting: Emergency Medicine

## 2020-04-02 ENCOUNTER — Other Ambulatory Visit: Payer: Self-pay

## 2020-04-02 ENCOUNTER — Emergency Department: Payer: Medicare Other

## 2020-04-02 ENCOUNTER — Emergency Department
Admission: EM | Admit: 2020-04-02 | Discharge: 2020-04-02 | Disposition: A | Discharge status: Home / Self Care | Payer: Medicare Other | Attending: Emergency Medicine | Admitting: Emergency Medicine

## 2020-04-02 DIAGNOSIS — Z87891 Personal history of nicotine dependence: Secondary | ICD-10-CM | POA: Diagnosis not present

## 2020-04-02 DIAGNOSIS — I2510 Atherosclerotic heart disease of native coronary artery without angina pectoris: Secondary | ICD-10-CM | POA: Insufficient documentation

## 2020-04-02 DIAGNOSIS — E119 Type 2 diabetes mellitus without complications: Secondary | ICD-10-CM | POA: Insufficient documentation

## 2020-04-02 DIAGNOSIS — M79672 Pain in left foot: Secondary | ICD-10-CM | POA: Diagnosis present

## 2020-04-02 DIAGNOSIS — L03032 Cellulitis of left toe: Secondary | ICD-10-CM | POA: Insufficient documentation

## 2020-04-02 DIAGNOSIS — N183 Chronic kidney disease, stage 3 unspecified: Secondary | ICD-10-CM | POA: Insufficient documentation

## 2020-04-02 LAB — CBC WITH DIFFERENTIAL/PLATELET
Abs Immature Granulocytes: 0.02 10*3/uL (ref 0.00–0.07)
Basophils Absolute: 0.1 10*3/uL (ref 0.0–0.1)
Basophils Relative: 1 %
Eosinophils Absolute: 0.7 10*3/uL — ABNORMAL HIGH (ref 0.0–0.5)
Eosinophils Relative: 12 %
HCT: 33.7 % — ABNORMAL LOW (ref 36.0–46.0)
Hemoglobin: 11.1 g/dL — ABNORMAL LOW (ref 12.0–15.0)
Immature Granulocytes: 0 %
Lymphocytes Relative: 25 %
Lymphs Abs: 1.4 10*3/uL (ref 0.7–4.0)
MCH: 29.1 pg (ref 26.0–34.0)
MCHC: 32.9 g/dL (ref 30.0–36.0)
MCV: 88.5 fL (ref 80.0–100.0)
Monocytes Absolute: 0.5 10*3/uL (ref 0.1–1.0)
Monocytes Relative: 10 %
Neutro Abs: 3 10*3/uL (ref 1.7–7.7)
Neutrophils Relative %: 52 %
Platelets: 197 10*3/uL (ref 150–400)
RBC: 3.81 MIL/uL — ABNORMAL LOW (ref 3.87–5.11)
RDW: 13 % (ref 11.5–15.5)
WBC: 5.7 10*3/uL (ref 4.0–10.5)
nRBC: 0 % (ref 0.0–0.2)

## 2020-04-02 LAB — BASIC METABOLIC PANEL
Anion gap: 7 (ref 5–15)
BUN: 25 mg/dL — ABNORMAL HIGH (ref 8–23)
CO2: 29 mmol/L (ref 22–32)
Calcium: 9 mg/dL (ref 8.9–10.3)
Chloride: 102 mmol/L (ref 98–111)
Creatinine, Ser: 0.99 mg/dL (ref 0.44–1.00)
GFR calc Af Amer: >60 mL/min (ref >60)
GFR calc non Af Amer: >60 mL/min (ref >60)
Glucose, Bld: 135 mg/dL — ABNORMAL HIGH (ref 70–99)
Potassium: 4.3 mmol/L (ref 3.5–5.1)
Sodium: 138 mmol/L (ref 135–145)

## 2020-04-02 LAB — SEDIMENTATION RATE: Sed Rate: 52 mm/hr — ABNORMAL HIGH (ref 0–30)

## 2020-04-02 MED ORDER — CEPHALEXIN 500 MG PO CAPS: 500.0000 mg | ORAL_CAPSULE | Freq: Three times a day (TID) | ORAL | 0 refills | Status: DC

## 2020-04-02 MED ORDER — SULFAMETHOXAZOLE-TRIMETHOPRIM 800-160 MG PO TABS: 1.0000 | ORAL_TABLET | Freq: Two times a day (BID) | ORAL | 0 refills | Status: DC

## 2020-04-02 NOTE — ED Notes (Signed)
Pt ambulatory to the restroom at this time.  

## 2020-04-02 NOTE — ED Triage Notes (Signed)
Pt sent for infection to 2nd digit on left foot. Injury to foot earlier this week. One time temp 102 Thursday, none since.  Sent from urgent care for possible sepsis r/t hypotension of 90/50 there.  Pt VSS here.  Redness to 2nd toe on left foot.

## 2020-04-02 NOTE — Telephone Encounter (Signed)
Please go ahead and add the patient to my schedule.

## 2020-04-02 NOTE — ED Provider Notes (Signed)
[facility] Emergency Department Provider Note  ____________________________________________  Time seen: Approximately 4:13 PM  I have reviewed the triage vital signs and the nursing notes.   HISTORY  Chief Complaint Wound Infection    HPI Melinda [name] is a 64 y.o. female with a history of diabetes, CAD, peripheral neuropathy, amputation of 2 toes on the right foot who comes ED complaining of a wound on the left foot second toe has been present for the past 3 days with worsening redness. She reports that 2 days ago she had a fever of 102, but has not had any fever or chills in the last few days. She denies any pain and does not feel the foot due to her peripheral neuropathy. Denies any other symptoms and feels well.  Reportedly the patient went to urgent care and was sent here due to a blood pressure measured at 90/50. Patient denies any lightheadedness or fatigue.      Past Medical History:  Diagnosis Date  . Amputation of great toe, right, traumatic ([place]) 05/30/2010  . Amputation of second toe, right, traumatic ([place]) 09/2017  . Anemia    years ago  . Anxiety   . Blue toes    2nd toe on right foot, will get appt.  . Bulging disc   . CAD (coronary artery disease) 2009   s/p stent to LAD  . 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  Ablations done.  Now has rare episodes  . Chicken pox   . Chronic fatigue   . Chronic kidney disease    "problem with kidney filtration"  . Coronary arteritis   . Degenerative disc disease   . Depression   . Diabetes mellitus without complication ([place])   . Facet joint disease   . Fibromyalgia   . Heart disease   . Hyperlipidemia   . IBS (irritable bowel syndrome)   . MCL deficiency, knee   . MRSA (methicillin resistant staph aureus) culture positive 2011   GREAT TOE RIGHT FOOT  . Neuropathy 04/18/2010  . Orthostatic hypotension 01/2020  . Peripheral neuropathy   . Renal insufficiency   .  Restless leg syndrome   . Sepsis ([place]) 09/2013  . Sleep apnea 08/17/2003   uses CPAP, sleep study at [facility] (mild to moderate)  . Spinal stenosis      Patient Active Problem List   Diagnosis Date Noted  . Malposition of intrathecal infusion catheter 03/11/2020  . Malfunction of intrathecal infusion pump 03/09/2020  . Thoracic facet hypertrophy/arthropathy (Multilevel) (Bilateral) 12/09/2019  . Thoracic facet syndrome (Multilevel) (Bilateral) 12/09/2019  . Encounter for interrogation of infusion pump 12/09/2019  . Sensation disturbance of skin 07/08/2019  . Allodynia 07/08/2019  . Hyperalgesia 07/08/2019  . Chronic low back pain (Bilateral) w/o sciatica 06/23/2019  . DDD (degenerative disc disease), lumbosacral 06/23/2019  . 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 Spondylosis without myelopathy or radiculopathy, lumbosacral region 11/05/2018  . CKD (chronic kidney disease) stage 3, GFR 30-59 ml/min 09/04/2018  . 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 08/06/2018  . DDD (degenerative disc disease), lumbar 05/15/2018  . Abnormal MRI, lumbar spine (04/01/2018) 05/06/2018  . Lumbar facet hypertrophy (Multilevel) 05/06/2018  . Lumbar foraminal stenosis 05/06/2018  . Lumbar lateral recess stenosis 05/06/2018  . Lumbar spinal stenosis w/ neurogenic claudication 04/09/2018  . Degenerative arthritis of knee (Bilateral) 03/12/2018  . Lumbar spondylosis 01/23/2018  . Hip pain, acute, left  01/01/2018  . Other secondary hypertension 08/09/2017  . OSA (obstructive sleep apnea) 08/09/2017  . Osteoarthritis of knee (Bilateral) (R>L) 06/08/2016  . Long term current use of opiate analgesic 04/21/2016  . Long term prescription opiate use 04/21/2016  . Opiate use 04/21/2016  . Encounter for adjustment or management of infusion pump 04/21/2016  . Osteoarthritis, multiple sites 04/21/2016  . Chronic musculoskeletal pain 04/21/2016  . Anxiety  and depression 09/11/2015  . Opioid-induced constipation (OIC) 07/27/2015  . Chronic knee pain (Primary Area of Pain) (Right) 07/27/2015  . Osteoarthritis of knee (Left) 07/27/2015  . 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 Chronic neck pain 05/24/2015  . Chronic low back pain (Bilateral) (L>R) w/ sciatica (Right) 05/24/2015  . Epidural fibrosis 05/24/2015  . Cervical spondylosis 05/24/2015  . Neuropathic pain 05/24/2015  . Lumbar facet syndrome 05/24/2015  . Fibromyalgia 05/24/2015  . Presence of functional implant (Medtronic programmable intrathecal pump) (Right abdominal area) 05/24/2015  . Presence of intrathecal pump (Medtronic intrathecal programmable pump) (40 mL pump) 05/24/2015  . Cervical spondylosis 05/24/2015  . Type 2 diabetes mellitus with diabetic mononeuropathy, without long-term current use of insulin ([place]) 04/27/2014  . Pure hypercholesterolemia 03/02/2014  . CAD (coronary artery disease) 10/04/2013  . Atherosclerotic heart disease of native coronary artery without angina pectoris 10/04/2013  . Chronic pain syndrome 04/18/2010     Past Surgical History:  Procedure Laterality Date  . ABLATION     UTERUS  . ABLATION     HEART  . AMPUTATION TOE Right 02/01/2017   Procedure: AMPUTATION TOE-RIGHT 2ND MPJ;  Surgeon: Albertine Patricia, DPM;  Location: [facility];  Service: Podiatry;  Laterality: Right;  . ANTERIOR CERVICAL DECOMP/DISCECTOMY FUSION N/A 07/28/2014   Procedure: ANTERIOR CERVICAL DECOMPRESSION/DISCECTOMY FUSION CERVICAL 3-4,4-5,5-6 LEVELS WITH INSTRUMENTATION AND ALLOGRAFT;  Surgeon: Sinclair Ship, MD;  Location: [place];  Service: Orthopedics;  Laterality: N/A;  Anterior cervical decompression fusion, cervical 3-4, cervical 4-5, cervical 5-6 with instrumentation and allograft  . BACK SURGERY     X 3 1979, 1994, 1995  . CARPAL TUNNEL RELEASE Right   . CHOLECYSTECTOMY  2003  . CORONARY ANGIOPLASTY   2008  . EYE SURGERY Bilateral 2013   Eyelid lift   . FOOT SURGERY Right    BIG TOE  . FOOT SURGERY Bilateral    PLANTAR FASCIITIS  . FOOT SURGERY Right    2ND TOE  . GALLBLADDER SURGERY    . HAMMER TOE SURGERY Right 10/17/2017   Procedure: HAMMER TOE CORRECTION-4TH TOE;  Surgeon: Albertine Patricia, DPM;  Location: [place];  Service: Podiatry;  Laterality: Right;  LMA- WITH LOCAL Diabetic - diet controlled  . HAND SURGERY Left   . HAND SURGEY Left   . HEART STENT  2009   LAD  . INFUSION PUMP IMPLANTATION     X2 with morphine and baclofen  . INTRATHECAL PUMP REVISION N/A 04/25/2018   Procedure: Intrathecal pump replacement;  Surgeon: Clydell Hakim, MD;  Location: [place];  Service: Neurosurgery;  Laterality: N/A;  right  . INTRATHECAL PUMP REVISION Right 04/25/2018  . INTRATHECAL PUMP REVISION N/A 12/12/2018   Procedure: Intrathecal pump revision with exploration of pocket;  Surgeon: Clydell Hakim, MD;  Location: [place];  Service: Neurosurgery;  Laterality: N/A;  Intrathecal pump revision with exploration of pocket  . INTRATHECAL PUMP REVISION N/A 03/11/2020   Procedure: INTRATHECAL PUMP REVISION;  Surgeon: [name], MD;  Location: [facility];  Service: Neurosurgery;  Laterality: N/A;  . IRRIGATION AND DEBRIDEMENT FOOT Right 02/23/2017   Procedure: IRRIGATION AND DEBRIDEMENT FOOT-right foot;  Surgeon: Samara Deist, DPM;  Location: [facility];  Service: Podiatry;  Laterality: Right;  . PAIN PUMP REVISION N/A 08/15/2018   Procedure: Intrathecal PUMP REVISION;  Surgeon: Clydell Hakim, MD;  Location: [place];  Service: Neurosurgery;  Laterality: N/A;  INTRATHECAL PUMP REVISION  . TOE SURGERY     then revision 8/18     Prior to Admission medications   Medication Sig Start Date End Date Taking? Authorizing Provider  ACCU-CHEK AVIVA PLUS test strip USE TO CHECK BLOOD SUGAR ONE TIME A DAY 07/02/19   Jearld Fenton, NP  Accu-Chek Softclix Lancets lancets USE TO CHECK BLOOD SUGAR  1 TIME DAILY 08/04/19   Jearld Fenton, NP  AMBULATORY NON FORMULARY MEDICATION Medication Name: CPAP MASK OF CHOICE FOR HOME DEVICE 07/01/15   Laverle Hobby, MD  Ascorbic Acid (VITAMIN C) 1000 MG tablet Take 1,000 mg by mouth daily.    [provider]  aspirin 81 MG tablet Take 81 mg by mouth daily.    [provider]  b complex vitamins tablet Take 1 tablet by mouth in the morning.    [provider]  buPROPion (WELLBUTRIN XL) 150 MG 24 hr tablet TAKE 1 TABLET BY MOUTH EVERY DAY Patient taking differently: Take 150 mg by mouth daily.  02/15/20   Jearld Fenton, NP  Caffeine-Magnesium Salicylate (DIUREX PO) Take 1 tablet by mouth daily as needed.    [provider]  CALCIUM-MAGNESIUM-ZINC PO Take 3 capsules by mouth daily.    [provider]  cephALEXin (KEFLEX) 500 MG capsule Take 1 capsule (500 mg total) by mouth 3 (three) times daily. 04/02/20   Carrie Mew, MD  CINNAMON PO Take 1,200 mg by mouth daily.     [provider]  Coenzyme Q10 (CO Q 10) 100 MG CAPS Take 100 mg by mouth daily.     [provider]  fluticasone (FLONASE) 50 MCG/ACT nasal spray Place 2 sprays into both nostrils daily as needed for allergies.  03/16/19   [provider]  glucosamine-chondroitin 500-400 MG tablet Take 2 tablets by mouth daily.     [provider]  Magnesium 500 MG TABS Take 500 mg by mouth at bedtime.     [provider]  Misc Natural Products (TART CHERRY ADVANCED PO) Take 1,200 mg by mouth at bedtime.     [provider]  Multiple Vitamin (MULTIVITAMIN) tablet Take 1 tablet by mouth daily.    [provider]  naloxone [facility]) 2 MG/2ML injection Inject 1 mL (1 mg total) into the muscle as needed for up to 2 doses (for opioid overdose). Inject content of syringe into thigh muscle. Call 911. 08/06/18   [name], MD  nitroGLYCERIN (NITROSTAT) 0.4 MG SL tablet Place 1 tablet (0.4 mg  total) under the tongue every 5 (five) minutes as needed for chest pain. Please keep upcoming appt in October. Thank you Patient taking differently: Place 0.4 mg under the tongue every 5 (five) minutes as needed for chest pain.  03/05/19   Jerline Pain, MD  NONFORMULARY OR COMPOUNDED ITEM 212.8 mcg by Epidural Infusion route. Medtronic Neuromodulation pump  Fentanyl 1,000.0 mcg/ml Baclofen 240.0 mcg/ml Bupivacaine 20.0 mg/ml Total dose 8.5 mcg/hr    [provider]  NONFORMULARY OR COMPOUNDED ITEM 88.77 mcg by Epidural Infusion route Continuous EPIDURAL. Medtronic Neuromodulation pump: Fentanyl, baclofen 88.77, bupivicane  [provider]  NONFORMULARY OR COMPOUNDED ITEM 5.918 mg by Epidural Infusion route Continuous EPIDURAL. Medtronic Neuromodulation pump: Fentanyl, Baclofen, Bupivicaine 5.918 mg     [provider]  Omega-3 Fatty Acids (FISH OIL) 1200 MG CAPS Take 1,200 mg by mouth in the morning.     [provider]  OVER THE COUNTER MEDICATION Take 1 capsule by mouth daily. Schigandra Plus    [provider]  oxyCODONE-acetaminophen (PERCOCET) 5-325 MG tablet Take 1 tablet by mouth every 8 (eight) hours as needed for up to 21 days for severe pain. 03/23/20 04/13/20  Gillis Santa, MD  PARoxetine (PAXIL) 20 MG tablet TAKE ONE-HALF OF A TABLET BY MOUTH DAILY IN THE MORNING Patient taking differently: Take 10 mg by mouth in the morning.  08/17/19   Jearld Fenton, NP  polyethylene glycol (MIRALAX / GLYCOLAX) packet Take 17 g by mouth daily.     [provider]  pregabalin (LYRICA) 150 MG capsule Take 1 capsule (150 mg total) by mouth 3 (three) times daily. 11/02/19 04/30/20  [name], MD  rosuvastatin (CRESTOR) 20 MG tablet TAKE 1 TABLET BY MOUTH EVERY DAY Patient taking differently: Take 20 mg by mouth at bedtime.  06/17/19   Jerline Pain, MD  sulfamethoxazole-trimethoprim (BACTRIM DS) 800-160 MG tablet Take 1 tablet by mouth 2  (two) times daily. 04/02/20   Carrie Mew, MD  tiZANidine (ZANAFLEX) 4 MG tablet Take 1 tablet (4 mg total) by mouth 3 (three) times daily. 11/02/19 04/30/20  [name], MD  Vitamin E 100 units TABS Take 100 Units by mouth daily.     [provider]     Allergies Patient has no known allergies.   Family History  Problem Relation Age of Onset  . Lung cancer Mother   . Diabetes Father   . Heart disease Maternal Grandfather   . Diabetes Paternal Grandmother   . Colon cancer Paternal Grandfather   . Stroke Neg Hx     Social History Social History   Tobacco Use  . Smoking status: Former Smoker    Packs/day: 1.50    Years: 32.00    Pack years: 48.00    Types: Cigarettes    Quit date: 07/22/2007    Years since quitting: 12.7  . Smokeless tobacco: Never Used  Vaping Use  . Vaping Use: Never used  Substance Use Topics  . Alcohol use: Yes    Alcohol/week: 0.0 standard drinks    Comment: occ - Holidays  . Drug use: Yes    Comment: prescribed pain pump and oxy    Review of Systems  Constitutional:   No current fever or chills.  ENT:   No sore throat. No rhinorrhea. Cardiovascular:   No chest pain or syncope. Respiratory:   No dyspnea or cough. Gastrointestinal:   Negative for abdominal pain, vomiting and diarrhea.  Musculoskeletal:   Wound on left second toe as above All other systems reviewed and are negative except as documented above in ROS and HPI.  ____________________________________________   PHYSICAL EXAM:  VITAL SIGNS: ED Triage Vitals [04/02/20 1017]  Enc Vitals Group     BP 118/65     Pulse Rate 64     Resp 18     Temp 98.1 F (36.7 C)     Temp Source Oral     SpO2 98 %     Weight 220 lb (99.8 kg)     Height 5\' 10"  (1.778 m)     Head  Circumference      Peak Flow      Pain Score      Pain Loc      Pain Edu?      Excl. in [place]?     Vital signs reviewed, nursing assessments reviewed.   Constitutional:   Alert and oriented.  Non-toxic appearance. Eyes:   Conjunctivae are normal. EOMI.  ENT      Head:   Normocephalic and atraumatic.      Nose:   Wearing a mask.      Mouth/Throat:   Wearing a mask.      Neck:   No meningismus. Full ROM. Cardiovascular:   RRR. Normal dorsalis pedis pulse. Cap refill less than 2 seconds. Respiratory:   Normal respiratory effort without tachypnea/retractions.  Musculoskeletal:   Normal range of motion in all extremities. No joint effusions.  No lower extremity tenderness.  No edema. Neurologic:   Normal speech and language.  Motor grossly intact. No acute focal neurologic deficits are appreciated.  Skin:    Skin is warm and dry. There is erythema with an area of purplish discoloration on the lateral aspect of the distal second toe. No purulent drainage, not fluctuant. No crepitus. No change in position or mobility of the toe. No open wound..       ____________________________________________    LABS (pertinent positives/negatives) (all labs ordered are listed, but only abnormal results are displayed) Labs Reviewed  BASIC METABOLIC PANEL - Abnormal; Notable for the following components:      Result Value   Glucose, Bld 135 (*)    BUN 25 (*)    All other components within normal limits  CBC WITH DIFFERENTIAL/PLATELET - Abnormal; Notable for the following components:   RBC 3.81 (*)    Hemoglobin 11.1 (*)    HCT 33.7 (*)    Eosinophils Absolute 0.7 (*)    All other components within normal limits  SEDIMENTATION RATE - Abnormal; Notable for the following components:   Sed Rate 52 (*)    All other components within normal limits   ____________________________________________   EKG    ____________________________________________    RADIOLOGY  DG Foot Complete Left  Result Date: 04/02/2020 CLINICAL DATA:  Toe wound. EXAM: LEFT FOOT - COMPLETE 3+ VIEW COMPARISON:  None. FINDINGS: There is no evidence of fracture or dislocation. No bony erosive change. Plantar  calcaneal enthesophytes. Mild midfoot and first MTP degenerative change. Soft tissue swelling about the foot. No radiopaque foreign body. IMPRESSION: 1. Soft tissue swelling about the foot without radiographic evidence of osteomyelitis or radiopaque foreign body. 2. No acute fracture. Electronically Signed   By: Margaretha Sheffield MD   On: 04/02/2020 11:23    ____________________________________________   PROCEDURES Procedures  ____________________________________________    CLINICAL IMPRESSION / ASSESSMENT AND PLAN / ED COURSE  Medications ordered in the ED: Medications - No data to display  Pertinent labs & imaging results that were available during my care of the patient were reviewed by me and considered in my medical decision making (see chart for details).  Luda Charbonneau was evaluated in Emergency Department on 04/02/2020 for the symptoms described in the history of present illness. She was evaluated in the context of the global COVID-19 pandemic, which necessitated consideration that the patient might be at risk for infection with the SARS-CoV-2 virus that causes COVID-19. Institutional protocols and algorithms that pertain to the evaluation of patients at risk for COVID-19 are in a state of rapid change based on  information released by regulatory bodies including the CDC and federal and state organizations. These policies and algorithms were followed during the patient's care in the ED.   Patient presents with a limited cellulitis of the left second toe. Doubt abscess or tenosynovitis. No evidence of osteomyelitis or necrotizing fasciitis. Its not gangrenous, and the toe and the rest of the foot are well-perfused.  Discussed with podiatry Dr. Luana Shu who agrees that starting oral antibiotics outpatient follow-up with her usual podiatrist (Dr. Elvina Mattes) in 2 to 3 days would be appropriate. Recommends Bactrim for MRSA coverage, and I will include Keflex as well to ensure good strep coverage.   Dr. Luana Shu also recommends Betadine dressings in the meantime and we will provide wound care supplies to the patient.      ____________________________________________   FINAL CLINICAL IMPRESSION(S) / ED DIAGNOSES    Final diagnoses:  Cellulitis of toe of left foot  Type 2 diabetes mellitus without complication, unspecified whether long term insulin use [facility])     ED Discharge Orders         Ordered    cephALEXin (KEFLEX) 500 MG capsule  3 times daily        04/02/20 1625    sulfamethoxazole-trimethoprim (BACTRIM DS) 800-160 MG tablet  2 times daily        04/02/20 1625          Portions of this note were generated with dragon dictation software. Dictation errors may occur despite best attempts at proofreading.   Carrie Mew, MD 04/02/20 [phone]

## 2020-04-02 NOTE — ED Notes (Signed)
Pt presents to the ED for wound infection of the 2nd digit of the L foot. Pt states she has a hx of diabetic neuropathy. Pt has a hx of amputation of two of her toes in the R foot. Pt states it started as a blister and it got worse rubbing against her third toe. Pt states it is not painful and able to ambulate on it. On assessment, toe is red, swollen, and some necrotic tissue noted around the toenail.

## 2020-04-04 DIAGNOSIS — Z20822 Contact with and (suspected) exposure to covid-19: Secondary | ICD-10-CM | POA: Diagnosis not present

## 2020-04-04 DIAGNOSIS — Z03818 Encounter for observation for suspected exposure to other biological agents ruled out: Secondary | ICD-10-CM | POA: Diagnosis not present

## 2020-04-06 DIAGNOSIS — L851 Acquired keratosis [keratoderma] palmaris et plantaris: Secondary | ICD-10-CM | POA: Diagnosis not present

## 2020-04-06 DIAGNOSIS — L97522 Non-pressure chronic ulcer of other part of left foot with fat layer exposed: Secondary | ICD-10-CM | POA: Diagnosis not present

## 2020-04-06 DIAGNOSIS — B351 Tinea unguium: Secondary | ICD-10-CM | POA: Diagnosis not present

## 2020-04-06 DIAGNOSIS — Z89421 Acquired absence of other right toe(s): Secondary | ICD-10-CM | POA: Diagnosis not present

## 2020-04-06 DIAGNOSIS — E1142 Type 2 diabetes mellitus with diabetic polyneuropathy: Secondary | ICD-10-CM | POA: Diagnosis not present

## 2020-04-11 NOTE — Progress Notes (Signed)
PROVIDER NOTE: Information contained herein reflects review and annotations entered in association with encounter. Interpretation of such information and data should be left to medically-trained personnel. Information provided to patient can be located elsewhere in the medical record under "Patient Instructions". Document created using STT-dictation technology, any transcriptional errors that may result from process are unintentional.    Patient: Melinda [name]  Service Category: E/M  Provider: Gaspar Cola, MD  DOB: 28-Jan-1956  DOS: 04/12/2020  Specialty: Interventional Pain Management  MRN: [id number]  Setting: Ambulatory outpatient  PCP: Jearld Fenton, NP  Type: Established Patient    Referring Provider: Jearld Fenton, NP  Location: Office  Delivery: Face-to-face     HPI  Melinda [name], a 64 y.o. year old female, is here today because of her Chronic pain syndrome [G89.4]. Melinda [name] primary complain today is Back Pain (lower) and Hand Pain (left) Last encounter: My last encounter with her was on 03/14/2020. Pertinent problems: Melinda [name] has Chronic pain syndrome; Failed back surgical syndrome; Failed cervical surgery syndrome (ACDF); Chronic neck pain; Chronic low back pain (Bilateral) (L>R) w/ sciatica (Right); Epidural fibrosis; Cervical spondylosis; Neuropathic pain; Lumbar facet syndrome; Fibromyalgia; Chronic knee pain (Primary Area of Pain) (Right); Osteoarthritis of knee (Left); Osteoarthritis, multiple sites; Chronic musculoskeletal pain; Osteoarthritis of knee (Bilateral) (R>L); Hip pain, acute, left; Lumbar spondylosis; Degenerative arthritis of knee (Bilateral); Lumbar spinal stenosis w/ neurogenic claudication; Abnormal MRI, lumbar spine (04/01/2018); Lumbar facet hypertrophy (Multilevel); Lumbar foraminal stenosis; Lumbar lateral recess stenosis; DDD (degenerative disc disease), lumbar; Chronic right-sided low back pain w/o sciatica from IT catheter anchor.; Spondylosis  without myelopathy or radiculopathy, lumbosacral region; Chronic low back pain (Bilateral) w/o sciatica; DDD (degenerative disc disease), lumbosacral; Allodynia; Hyperalgesia; Thoracic facet hypertrophy/arthropathy (Multilevel) (Bilateral); Thoracic facet syndrome (Multilevel) (Bilateral); Malfunction of intrathecal infusion pump; Malposition of intrathecal infusion catheter; Pain and numbness of upper extremity (Bilateral); Cervicalgia; and History of carpal tunnel surgery (Bilateral) on their pertinent problem list. Pain Assessment: Severity of Chronic pain is reported as a 3 /10. Location: Back Lower/denies. Onset: More than a month ago. Quality: Aching, Constant, Stabbing. Timing: Constant. Modifying factor(s): rest, heat. Vitals:  height is $RemoveB'5\' 9"'yknBgxNK$  (1.753 m) and weight is 220 lb (99.8 kg). Her temperature is 97.9 F (36.6 C). Her blood pressure is 151/81 (abnormal) and her pulse is 74. Her respiration is 18 and oxygen saturation is 98%.   Reason for encounter: both, medication management and post-procedure assessment.  The patient comes into the clinics today for follow-up evaluation after the intrathecal pump and catheter revision.  Her abdominal and back wounds are both healed without any problems no redness, no swelling, and no evidence of any type of problems or complications.  She does seem to have accumulated a little bit of fluid again in the abdominal wound, but the pump does not seem to be moving anymore.  Today she indicates that she still having some pain in the lower back and therefore we will increase the pump by 10%.  In addition, I have encouraged her to call us if the pain in the lower back is not under control so that we can continue increasing the pump by 10 to 20%, the pending on how she tolerates this first 10% increase.  However, in reviewing her pump rate, she definitely has enough room for some significant improvement.  Today she also indicated not having been able to tolerate the MS  Contin and she brought back where we disposed of it in front of  witnesses.  She was provided with a prescription for some Percocet by Dr. Gillis Santa and this seems to have worked for her.  Today I will go ahead and refill the oxycodone/APAP for the next 90 days.  However, I have encouraged patient to use it only for of the pain above the thoracic region and not for the low back pain.  I want her to try to control that one with the pump.  She understood and accepted.  Today she was informed that our goal will be to try to completely eliminate that oxycodone.  However, I also realized that she is having some problems in the cervical region and today she has complained of bilateral arm pain and numbness.  Physical exam today was negative for Tinel's sign and Phalen's test however upon performing the Fallon's test, she did experience a weird painful sensation over her left thumb.  She has a history of having had prior carpal tunnel surgery and she denies any nerve conduction test of the upper extremity.  She had some prior neck surgery where she has an implanted prosthetic disc.  However, she also indicates that movement of the neck does not reproduce any of her upper extremity symptoms.  Interestingly, when she flexes her left elbow and internally rotates the hand, this tends to give her a shooting pain into the hand.  This appears to be related to a peripheral nerve problem.  I have encouraged her to talk to the surgeon who initially did her carpal tunnel surgery to see if this is something that he has seen before.  According to her it is the same physician that we will be doing her right knee surgery around November 23.  This is the primary reason why we will also reschedule her next pump refill to be done before the surgery since she is not sure how long she will be unable to walk after that surgery.  Hopefully if she undergoes the surgery with a full pump, she should be able to stay home for up to 3 months,  without needing to come in for a pump refill.  Post-Procedure Evaluation  Procedure (03/14/2020): Therapeutic intrathecal pump and catheter revision under general anesthesia  Pharmacotherapy Assessment   Analgesic: Oxycodone 5 mg, 1 tab PO q 8 hrs (15 mg/day of oxycodone) + Intrathecal PF-Fentanyl MME/day: 22.5 mg/day (oral).   Monitoring: [place] PMP: PDMP reviewed during this encounter.       Pharmacotherapy: No side-effects or adverse reactions reported. Compliance: No problems identified. Effectiveness: Clinically acceptable.  Ignatius Specking, RN  04/12/2020  1:41 PM  Sign when Signing Visit Nursing Pain Medication Assessment:  Safety precautions to be maintained throughout the outpatient stay will include: orient to surroundings, keep bed in low position, maintain call bell within reach at all times, provide assistance with transfer out of bed and ambulation.  Medication Inspection Compliance: Pill count conducted under aseptic conditions, in front of the patient. Neither the pills nor the bottle was removed from the patient's sight at any time. Once count was completed pills were immediately returned to the patient in their original bottle.  Medication: Oxycodone/APAP Pill/Patch Count: 3 of 60 pills remain Pill/Patch Appearance: Markings consistent with prescribed medication Bottle Appearance: Standard pharmacy container. Clearly labeled. Filled Date: 90 / 22 / 2021 Last Medication intake:  Today   50/60 mso4 sulf ER $Remov'15mg'qLRpFv$  wasted . PColeman ,myself and SBrown wasted.    UDS:  Summary  Date Value Ref Range Status  07/24/2018 FINAL  Final    Comment:    ==================================================================== TOXASSURE SELECT 13 (MW) ==================================================================== Test                             Result       Flag       Units Drug Present and Declared for Prescription Verification   Oxycodone                      754           EXPECTED   ng/mg creat   Oxymorphone                    231          EXPECTED   ng/mg creat   Noroxycodone                   2463         EXPECTED   ng/mg creat   Noroxymorphone                 89           EXPECTED   ng/mg creat    Sources of oxycodone are scheduled prescription medications.    Oxymorphone, noroxycodone, and noroxymorphone are expected    metabolites of oxycodone. Oxymorphone is also available as a    scheduled prescription medication. Drug Present not Declared for Prescription Verification   Fentanyl                       4            UNEXPECTED ng/mg creat   Norfentanyl                    29           UNEXPECTED ng/mg creat    Source of fentanyl is a scheduled prescription medication,    including IV, patch, and transmucosal formulations. Norfentanyl    is an expected metabolite of fentanyl. ==================================================================== Test                      Result    Flag   Units      Ref Range   Creatinine              134              mg/dL      >=20 ==================================================================== Declared Medications:  The flagging and interpretation on this report are based on the  following declared medications.  Unexpected results may arise from  inaccuracies in the declared medications.  **Note: The testing scope of this panel includes these medications:  Oxycodone  **Note: The testing scope of this panel does not include following  reported medications:  Aspirin (Aspirin 81)  Bupropion (Wellbutrin)  Chondroitin (Glucosamine-Chondroitin)  Cinnamon  Glipizide (Glucotrol)  Glucosamine (Glucosamine-Chondroitin)  Magnesium  Multivitamin  Naloxone  Nitroglycerin (Nitrostat)  Paroxetine  Polyethylene Glycol (GlycoLAX)  Polyethylene Glycol (MiraLAX)  Pregabalin (Lyrica)  Rosuvastatin (Crestor)  Supplement (Omega-3)  Tizanidine (Zanaflex)  Turmeric  Ubiquinone (Coenzyme Q 10)  Vitamin D2 (Drisdol)   Vitamin E ==================================================================== For clinical consultation, please call [phone]. ====================================================================      ROS  Constitutional: Denies any fever or chills Gastrointestinal: No reported hemesis, hematochezia, vomiting, or acute GI distress Musculoskeletal: Denies any acute onset joint swelling, redness,  loss of ROM, or weakness Neurological: No reported episodes of acute onset apraxia, aphasia, dysarthria, agnosia, amnesia, paralysis, loss of coordination, or loss of consciousness  Medication Review  AMBULATORY NON FORMULARY MEDICATION, Accu-Chek Softclix Lancets, Caffeine-Magnesium Salicylate, Calcium-Magnesium-Zinc, Cinnamon, Co Q 10, Fish Oil, Magnesium, Misc Natural Products, NONFORMULARY OR COMPOUNDED ITEM, OVER THE COUNTER MEDICATION, PARoxetine, Vitamin E, aspirin, b complex vitamins, buPROPion, cephALEXin, fluticasone, glucosamine-chondroitin, glucose blood, multivitamin, naloxone, nitroGLYCERIN, oxyCODONE-acetaminophen, polyethylene glycol, pregabalin, rosuvastatin, sulfamethoxazole-trimethoprim, tiZANidine, and vitamin C  History Review  Allergy: Melinda [name] has No Known Allergies. Drug: Melinda [name]  reports current drug use. Alcohol:  reports current alcohol use. Tobacco:  reports that she quit smoking about 12 years ago. Her smoking use included cigarettes. She has a 48.00 pack-year smoking history. She has never used smokeless tobacco. Social: Melinda [name]  reports that she quit smoking about 12 years ago. Her smoking use included cigarettes. She has a 48.00 pack-year smoking history. She has never used smokeless tobacco. She reports current alcohol use. She reports current drug use. 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), Amputation of second toe, right, traumatic (HCC) (09/2017), Anemia, Anxiety, Blue toes, Bulging disc,  CAD (coronary artery disease) (2009), Cardiac arrhythmia due to congenital heart disease, Chicken pox, Chronic fatigue, Chronic kidney disease, Coronary arteritis, Degenerative disc disease, Depression, Diabetes mellitus without complication (HCC), Facet joint disease, Fibromyalgia, Heart disease, Hyperlipidemia, IBS (irritable bowel syndrome), MCL deficiency, knee, MRSA (methicillin resistant staph aureus) culture positive (2011), Neuropathy (04/18/2010), Orthostatic hypotension (01/2020), Peripheral neuropathy, Renal insufficiency, Restless leg syndrome, Sepsis (HCC) (09/2013), Sleep apnea (08/17/2003), and Spinal stenosis. Surgical: Melinda [name]  has a past surgical history that includes Foot surgery (Right); Hand surgery (Left); Gallbladder surgery; Ablation; Ablation; HEART STENT (2009); HAND SURGEY (Left); Foot surgery (Bilateral); Foot surgery (Right); Infusion pump implantation; Back surgery; Cholecystectomy (2003); Anterior cervical decomp/discectomy fusion (N/A, 07/28/2014); Carpal tunnel release (Right); Eye surgery (Bilateral, 2013); Amputation toe (Right, 02/01/2017); Irrigation and debridement foot (Right, 02/23/2017); Toe Surgery; Hammer toe surgery (Right, 10/17/2017); Intrathecal pump revision (N/A, 04/25/2018); Intrathecal pump revision (Right, 04/25/2018); Coronary angioplasty (2008); Pain pump revision (N/A, 08/15/2018); Intrathecal pump revision (N/A, 12/12/2018); Intrathecal pump revision (N/A, 03/11/2020); and Knee surgery (Right, 05/24/2020). Family: family history includes Colon cancer in her paternal grandfather; Diabetes in her father and paternal grandmother; Heart disease in her maternal grandfather; Lung cancer in her mother.  Laboratory Chemistry Profile   Renal Lab Results  Component Value Date   BUN 25 (H) 04/02/2020   CREATININE 0.99 04/02/2020   GFR 51.87 (L) 03/22/2020   GFRAA >60 04/02/2020   GFRNONAA >60 04/02/2020     Hepatic Lab Results  Component Value Date   AST 20  04/01/2020   ALT 14 04/01/2020   ALBUMIN 4.0 04/01/2020   ALKPHOS 82 04/01/2020   HCVAB NEGATIVE 05/18/2016   LIPASE 11 10/04/2013     Electrolytes Lab Results  Component Value Date   NA 138 04/02/2020   K 4.3 04/02/2020   CL 102 04/02/2020   CALCIUM 9.0 04/02/2020   MG 2.1 06/30/2013     Bone Lab Results  Component Value Date   VD25OH 34.99 03/12/2019     Inflammation (CRP: Acute Phase) (ESR: Chronic Phase) Lab Results  Component Value Date   CRP 1 05/06/2018   ESRSEDRATE 52 (H) 04/02/2020   LATICACIDVEN 0.9 04/01/2020       Note: Above Lab results reviewed.  Recent Imaging Review  DG Foot Complete  Left CLINICAL DATA:  Toe wound.  EXAM: LEFT FOOT - COMPLETE 3+ VIEW  COMPARISON:  None.  FINDINGS: There is no evidence of fracture or dislocation. No bony erosive change. Plantar calcaneal enthesophytes. Mild midfoot and first MTP degenerative change. Soft tissue swelling about the foot. No radiopaque foreign body.  IMPRESSION: 1. Soft tissue swelling about the foot without radiographic evidence of osteomyelitis or radiopaque foreign body. 2. No acute fracture.  Electronically Signed   By: Margaretha Sheffield MD   On: 04/02/2020 11:23 Note: Reviewed        Physical Exam  General appearance: Well nourished, well developed, and well hydrated. In no apparent acute distress Mental status: Alert, oriented x 3 (person, place, & time)       Respiratory: No evidence of acute respiratory distress Eyes: PERLA Vitals: BP (!) 151/81   Pulse 74   Temp 97.9 F (36.6 C)   Resp 18   Ht $R'5\' 9"'bc$  (1.753 m)   Wt 220 lb (99.8 kg)   LMP  (LMP Unknown)   SpO2 98%   BMI 32.49 kg/m  BMI: Estimated body mass index is 32.49 kg/m as calculated from the following:   Height as of this encounter: $RemoveBeforeD'5\' 9"'VwKpIhIffGMbeb$  (1.753 m).   Weight as of this encounter: 220 lb (99.8 kg). Ideal: Ideal body weight: 66.2 kg (145 lb 15.1 oz) Adjusted ideal body weight: 79.6 kg (175 lb 9.1 oz)  Assessment    Status Diagnosis  Controlled Controlled Controlled 1. Chronic pain syndrome   2. Failed back surgical syndrome   3. Epidural fibrosis   4. 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 Presence of intrathecal pump (Medtronic intrathecal programmable pump) (40 mL pump)   6. Cervical spondylosis   7. Pain and numbness of upper extremity (Bilateral)   8. Cervicalgia   9. Failed cervical surgery syndrome (ACDF)   10. History of carpal tunnel surgery (Bilateral)      Updated Problems: Problem  Pain and numbness of upper extremity (Bilateral)  Cervicalgia  History of carpal tunnel surgery (Bilateral)    Plan of Care  Problem-specific:  No problem-specific Assessment & Plan notes found for this encounter.  Melinda [name] has a current medication list which includes the following long-term medication(s): bupropion, fluticasone, naloxone, nitroglycerin, [START ON 04/13/2020] oxycodone-acetaminophen, [START ON 05/13/2020] oxycodone-acetaminophen, [START ON 06/12/2020] oxycodone-acetaminophen, paroxetine, pregabalin, rosuvastatin, and tizanidine.  Pharmacotherapy (Medications Ordered): Meds ordered this encounter  Medications  . oxyCODONE-acetaminophen (PERCOCET) 5-325 MG tablet    Sig: Take 1 tablet by mouth every 8 (eight) hours as needed for severe pain.    Dispense:  90 tablet    Refill:  0    Chronic Pain: STOP Act (Not applicable) Fill 1 day early if closed on refill date. Avoid benzodiazepines within 8 hours of opioids  . oxyCODONE-acetaminophen (PERCOCET) 5-325 MG tablet    Sig: Take 1 tablet by mouth every 8 (eight) hours as needed for severe pain.    Dispense:  90 tablet    Refill:  0    Chronic Pain: STOP Act (Not applicable) Fill 1 day early if closed on refill date. Avoid benzodiazepines within 8 hours of opioids  . oxyCODONE-acetaminophen (PERCOCET) 5-325 MG tablet    Sig: Take 1 tablet by mouth every 8 (eight) hours as needed for severe pain.     Dispense:  90 tablet    Refill:  0    Chronic Pain: STOP Act (Not applicable) Fill 1 day  early if closed on refill date. Avoid benzodiazepines within 8 hours of opioids   Orders:  Orders Placed This Encounter  Procedures  . PUMP REPROGRAM    Follow programming protocol by having two(2) healthcare providers present during programming.    Scheduling Instructions:     Please perform the following adjustment: Increase rate by 10%.    Order Specific Question:   Where will this procedure be performed?    Answer:   [facility]  . NCV with EMG(electromyography)    Bilateral testing requested.    Standing Status:   Future    Standing Expiration Date:   04/12/2021    Scheduling Instructions:     Please refer this patient to [facility] for Nerve Conduction testing of the upper extremities. (EMG & PNCV)    Order Specific Question:   Where should this test be performed?    Answer:   Other   Follow-up plan:   Return for Pump Refill (Max:33mo).      Interventional treatment options:  Under consideration: Possible left lumbar facet RFA  Possible right lumbar facet RFA  Diagnostic caudal ESI + diagnostic epidurogram Possible Racz procedure Diagnostic bilateral IA knee injection (Steroid) Diagnostic bilateral genicular NB Possible bilateral genicular nerve RFA Possible bilateral Hyalgan knee injection DiagnosticcervicalESI Diagnostic bilateral cervical facet block Possible bilateral cervical facet RFA   Therapeutic/palliative (PRN): Palliative/therapeutic intrathecal pump management (refills/programming adjustments)  Palliative left L2-3 LESI #2 Palliative right lumbar facet block #4  Diagnostic left lumbar facet block #2     Recent Visits Date Type Provider Dept  03/23/20 Office Visit Gillis Santa, MD [facility]  02/23/20 Procedure visit [name], MD [facility]  Showing recent visits within past 90 days and  meeting all other requirements Today's Visits Date Type Provider Dept  04/12/20 Office Visit [name], MD [facility]  Showing today's visits and meeting all other requirements Future Appointments Date Type Provider Dept  05/19/20 Appointment [name], MD [facility]  Showing future appointments within next 90 days and meeting all other requirements  I discussed the assessment and treatment plan with the patient. The patient was provided an opportunity to ask questions and all were answered. The patient agreed with the plan and demonstrated an understanding of the instructions.  Patient advised to call back or seek an in-person evaluation if the symptoms or condition worsens.  Duration of encounter: 30 minutes.  Note by: Gaspar Cola, MD Date: 04/12/2020; Time: 2:35 PM

## 2020-04-12 ENCOUNTER — Other Ambulatory Visit: Payer: Self-pay

## 2020-04-12 ENCOUNTER — Ambulatory Visit: Payer: Medicare Other | Attending: Pain Medicine | Admitting: Pain Medicine

## 2020-04-12 ENCOUNTER — Encounter: Payer: Self-pay | Admitting: Pain Medicine

## 2020-04-12 VITALS — BP 151/81 | HR 74 | Temp 97.9°F | Resp 18 | Ht 69.0 in | Wt 220.0 lb

## 2020-04-12 DIAGNOSIS — G894 Chronic pain syndrome: Secondary | ICD-10-CM

## 2020-04-12 DIAGNOSIS — M542 Cervicalgia: Secondary | ICD-10-CM

## 2020-04-12 DIAGNOSIS — M5441 Lumbago with sciatica, right side: Secondary | ICD-10-CM

## 2020-04-12 DIAGNOSIS — G96198 Other disorders of meninges, not elsewhere classified: Secondary | ICD-10-CM | POA: Diagnosis not present

## 2020-04-12 DIAGNOSIS — G8929 Other chronic pain: Secondary | ICD-10-CM

## 2020-04-12 DIAGNOSIS — M961 Postlaminectomy syndrome, not elsewhere classified: Secondary | ICD-10-CM | POA: Diagnosis not present

## 2020-04-12 DIAGNOSIS — M47812 Spondylosis without myelopathy or radiculopathy, cervical region: Secondary | ICD-10-CM

## 2020-04-12 DIAGNOSIS — Z9889 Other specified postprocedural states: Secondary | ICD-10-CM | POA: Diagnosis not present

## 2020-04-12 DIAGNOSIS — R200 Anesthesia of skin: Secondary | ICD-10-CM | POA: Diagnosis not present

## 2020-04-12 DIAGNOSIS — I2510 Atherosclerotic heart disease of native coronary artery without angina pectoris: Secondary | ICD-10-CM

## 2020-04-12 DIAGNOSIS — Z978 Presence of other specified devices: Secondary | ICD-10-CM | POA: Diagnosis not present

## 2020-04-12 DIAGNOSIS — I2583 Coronary atherosclerosis due to lipid rich plaque: Secondary | ICD-10-CM

## 2020-04-12 DIAGNOSIS — M79603 Pain in arm, unspecified: Secondary | ICD-10-CM

## 2020-04-12 MED ORDER — OXYCODONE-ACETAMINOPHEN 5-325 MG PO TABS: 1.0000 | ORAL_TABLET | Freq: Three times a day (TID) | ORAL | 0 refills | Status: DC | PRN

## 2020-04-12 NOTE — Progress Notes (Signed)
Nursing Pain Medication Assessment:  Safety precautions to be maintained throughout the outpatient stay will include: orient to surroundings, keep bed in low position, maintain call bell within reach at all times, provide assistance with transfer out of bed and ambulation.  Medication Inspection Compliance: Pill count conducted under aseptic conditions, in front of the patient. Neither the pills nor the bottle was removed from the patient's sight at any time. Once count was completed pills were immediately returned to the patient in their original bottle.  Medication: Oxycodone/APAP Pill/Patch Count: 3 of 60 pills remain Pill/Patch Appearance: Markings consistent with prescribed medication Bottle Appearance: Standard pharmacy container. Clearly labeled. Filled Date: 2 / 22 / 2021 Last Medication intake:  Today   50/60 mso4 sulf ER 15mg  wasted . PColeman ,myself and SBrown wasted.

## 2020-04-12 NOTE — Patient Instructions (Addendum)
____________________________________________________________________________________________  Medication Rules  Purpose: To inform patients, and their family members, of our rules and regulations.  Applies to: All patients receiving prescriptions (written or electronic).  Pharmacy of record: Pharmacy where electronic prescriptions will be sent. If written prescriptions are taken to a different pharmacy, please inform the nursing staff. The pharmacy listed in the electronic medical record should be the one where you would like electronic prescriptions to be sent.  Electronic prescriptions: In compliance with the [place] Strengthen Opioid Misuse Prevention (STOP) Act of 2017 (Session Law 2017-74/H243), effective July 02, 2018, all controlled substances must be electronically prescribed. Calling prescriptions to the pharmacy will cease to exist.  Prescription refills: Only during scheduled appointments. Applies to all prescriptions.  NOTE: The following applies primarily to controlled substances (Opioid* Pain Medications).   Type of encounter (visit): For patients receiving controlled substances, face-to-face visits are required. (Not an option or up to the patient.)  Patient's responsibilities: 1. Pain Pills: Bring all pain pills to every appointment (except for procedure appointments). 2. Pill Bottles: Bring pills in original pharmacy bottle. Always bring the newest bottle. Bring bottle, even if empty. 3. Medication refills: You are responsible for knowing and keeping track of what medications you take and those you need refilled. The day before your appointment: write a list of all prescriptions that need to be refilled. The day of the appointment: give the list to the admitting nurse. Prescriptions will be written only during appointments. No prescriptions will be written on procedure days. If you forget a medication: it will not be "Called in", "Faxed", or "electronically sent".  You will need to get another appointment to get these prescribed. No early refills. Do not call asking to have your prescription filled early. 4. Prescription Accuracy: You are responsible for carefully inspecting your prescriptions before leaving our office. Have the discharge nurse carefully go over each prescription with you, before taking them home. Make sure that your name is accurately spelled, that your address is correct. Check the name and dose of your medication to make sure it is accurate. Check the number of pills, and the written instructions to make sure they are clear and accurate. Make sure that you are given enough medication to last until your next medication refill appointment. 5. Taking Medication: Take medication as prescribed. When it comes to controlled substances, taking less pills or less frequently than prescribed is permitted and encouraged. Never take more pills than instructed. Never take medication more frequently than prescribed.  6. Inform other Doctors: Always inform, all of your healthcare providers, of all the medications you take. 7. Pain Medication from other Providers: You are not allowed to accept any additional pain medication from any other Doctor or Healthcare provider. There are two exceptions to this rule. (see below) In the event that you require additional pain medication, you are responsible for notifying us, as stated below. 8. Medication Agreement: You are responsible for carefully reading and following our Medication Agreement. This must be signed before receiving any prescriptions from our practice. Safely store a copy of your signed Agreement. Violations to the Agreement will result in no further prescriptions. (Additional copies of our Medication Agreement are available upon request.) 9. Laws, Rules, & Regulations: All patients are expected to follow all Federal and State Laws, Statutes, Rules, & Regulations. Ignorance of the Laws does not constitute a  valid excuse.  10. Illegal drugs and Controlled Substances: The use of illegal substances (including, but not limited to marijuana and its   derivatives) and/or the illegal use of any controlled substances is strictly prohibited. Violation of this rule may result in the immediate and permanent discontinuation of any and all prescriptions being written by our practice. The use of any illegal substances is prohibited. 11. Adopted CDC guidelines & recommendations: Target dosing levels will be at or below 60 MME/day. Use of benzodiazepines** is not recommended.  Exceptions: There are only two exceptions to the rule of not receiving pain medications from other Healthcare Providers. 1. Exception #1 (Emergencies): In the event of an emergency (i.e.: accident requiring emergency care), you are allowed to receive additional pain medication. However, you are responsible for: As soon as you are able, call our office [phone], at any time of the day or night, and leave a message stating your name, the date and nature of the emergency, and the name and dose of the medication prescribed. In the event that your call is answered by a member of our staff, make sure to document and save the date, time, and the name of the person that took your information.  2. Exception #2 (Planned Surgery): In the event that you are scheduled by another doctor or dentist to have any type of surgery or procedure, you are allowed (for a period no longer than 30 days), to receive additional pain medication, for the acute post-op pain. However, in this case, you are responsible for picking up a copy of our "Post-op Pain Management for Surgeons" handout, and giving it to your surgeon or dentist. This document is available at our office, and does not require an appointment to obtain it. Simply go to our office during business hours (Monday-Thursday from 8:00 AM to 4:00 PM) (Friday 8:00 AM to 12:00 Noon) or if you have a scheduled appointment  with us, prior to your surgery, and ask for it by name. In addition, you are responsible for: calling our office [phone], at any time of the day or night, and leaving a message stating your name, name of your surgeon, type of surgery, and date of procedure or surgery. Failure to comply with your responsibilities may result in termination of therapy involving the controlled substances.  *Opioid medications include: morphine, codeine, oxycodone, oxymorphone, hydrocodone, hydromorphone, meperidine, tramadol, tapentadol, buprenorphine, fentanyl, methadone. **Benzodiazepine medications include: diazepam (Valium), alprazolam (Xanax), clonazepam (Klonopine), lorazepam (Ativan), clorazepate (Tranxene), chlordiazepoxide (Librium), estazolam (Prosom), oxazepam (Serax), temazepam (Restoril), triazolam (Halcion) (Last updated: 03/08/2020) ____________________________________________________________________________________________   ____________________________________________________________________________________________  Medication Recommendations and Reminders  Applies to: All patients receiving prescriptions (written and/or electronic).  Medication Rules & Regulations: These rules and regulations exist for your safety and that of others. They are not flexible and neither are we. Dismissing or ignoring them will be considered "non-compliance" with medication therapy, resulting in complete and irreversible termination of such therapy. (See document titled "Medication Rules" for more details.) In all conscience, because of safety reasons, we cannot continue providing a therapy where the patient does not follow instructions.  Pharmacy of record:   Definition: This is the pharmacy where your electronic prescriptions will be sent.   We do not endorse any particular pharmacy, however, we have experienced problems with [facility] not securing enough medication supply for the community.  We do not  restrict you in your choice of pharmacy. However, once we write for your prescriptions, we will NOT be re-sending more prescriptions to fix restricted supply problems created by your pharmacy, or your insurance.   The pharmacy listed in the electronic medical record should be the   one where you want electronic prescriptions to be sent.  If you choose to change pharmacy, simply notify our nursing staff.  Recommendations:  Keep all of your pain medications in a safe place, under lock and key, even if you live alone. We will NOT replace lost, stolen, or damaged medication.  After you fill your prescription, take 1 week's worth of pills and put them away in a safe place. You should keep a separate, properly labeled bottle for this purpose. The remainder should be kept in the original bottle. Use this as your primary supply, until it runs out. Once it's gone, then you know that you have 1 week's worth of medicine, and it is time to come in for a prescription refill. If you do this correctly, it is unlikely that you will ever run out of medicine.  To make sure that the above recommendation works, it is very important that you make sure your medication refill appointments are scheduled at least 1 week before you run out of medicine. To do this in an effective manner, make sure that you do not leave the office without scheduling your next medication management appointment. Always ask the nursing staff to show you in your prescription , when your medication will be running out. Then arrange for the receptionist to get you a return appointment, at least 7 days before you run out of medicine. Do not wait until you have 1 or 2 pills left, to come in. This is very poor planning and does not take into consideration that we may need to cancel appointments due to bad weather, sickness, or emergencies affecting our staff.  DO NOT ACCEPT A "Partial Fill": If for any reason your pharmacy does not have enough pills/tablets  to completely fill or refill your prescription, do not allow for a "partial fill". The law allows the pharmacy to complete that prescription within 72 hours, without requiring a new prescription. If they do not fill the rest of your prescription within those 72 hours, you will need a separate prescription to fill the remaining amount, which we will NOT provide. If the reason for the partial fill is your insurance, you will need to talk to the pharmacist about payment alternatives for the remaining tablets, but again, DO NOT ACCEPT A PARTIAL FILL, unless you can trust your pharmacist to obtain the remainder of the pills within 72 hours.  Prescription refills and/or changes in medication(s):   Prescription refills, and/or changes in dose or medication, will be conducted only during scheduled medication management appointments. (Applies to both, written and electronic prescriptions.)  No refills on procedure days. No medication will be changed or started on procedure days. No changes, adjustments, and/or refills will be conducted on a procedure day. Doing so will interfere with the diagnostic portion of the procedure.  No phone refills. No medications will be "called into the pharmacy".  No Fax refills.  No weekend refills.  No Holliday refills.  No after hours refills.  Remember:  Business hours are:  Monday to Thursday 8:00 AM to 4:00 PM Provider's Schedule: [name], MD - Appointments are:  Medication management: Monday and Wednesday 8:00 AM to 4:00 PM Procedure day: Tuesday and Thursday 7:30 AM to 4:00 PM Gillis Santa, MD - Appointments are:  Medication management: Tuesday and Thursday 8:00 AM to 4:00 PM Procedure day: Monday and Wednesday 7:30 AM to 4:00 PM (Last update: 01/20/2020) ____________________________________________________________________________________________   ____________________________________________________________________________________________  CBD  (cannabidiol) WARNING  Applicable to: All individuals currently  taking or considering taking CBD (cannabidiol) and, more important, all patients taking opioid analgesic controlled substances (pain medication). (Example: oxycodone; oxymorphone; hydrocodone; hydromorphone; morphine; methadone; tramadol; tapentadol; fentanyl; buprenorphine; butorphanol; dextromethorphan; meperidine; codeine; etc.)  Legal status: CBD remains a Schedule I drug prohibited for any use. CBD is illegal with one exception. In the [place], CBD has a limited Transport planner (FDA) approval for the treatment of two specific types of epilepsy disorders. Only one CBD product has been approved by the FDA for this purpose: "Epidiolex". FDA is aware that some companies are marketing products containing cannabis and cannabis-derived compounds in ways that violate the [employer], Drug and Cosmetic Act [facility] Act) and that may put the health and safety of consumers at risk. The FDA, a Federal agency, has not enforced the CBD status since 2018.   Legality: Some manufacturers ship CBD products nationally, which is illegal. Often such products are sold online and are therefore available throughout the country. CBD is openly sold in head shops and health food stores in some states where such sales have not been explicitly legalized. Selling unapproved products with unsubstantiated therapeutic claims is not only a violation of the law, but also can put patients at risk, as these products have not been proven to be safe or effective. Federal illegality makes it difficult to conduct research on CBD.  Reference: "FDA Regulation of Cannabis and Cannabis-Derived Products, Including Cannabidiol (CBD)" - [employer]  Warning: CBD is not FDA approved and has not undergo the same manufacturing controls as prescription  drugs.  This means that the purity and safety of available CBD may be questionable. Most of the time, despite manufacturer's claims, it is contaminated with THC (delta-9-tetrahydrocannabinol - the chemical in marijuana responsible for the "HIGH").  When this is the case, the [facility] contaminant will trigger a positive urine drug screen (UDS) test for Marijuana (carboxy-THC). Because a positive UDS for any illicit substance is a violation of our medication agreement, your opioid analgesics (pain medicine) may be permanently discontinued.  MORE ABOUT CBD  General Information: CBD  is a derivative of the Marijuana (cannabis sativa) plant discovered in 36. It is one of the 113 identified substances found in Marijuana. It accounts for up to 40% of the plant's extract. As of 2018, preliminary clinical studies on CBD included research for the treatment of anxiety, movement disorders, and pain. CBD is available and consumed in multiple forms, including inhalation of smoke or vapor, as an aerosol spray, and by mouth. It may be supplied as an oil containing CBD, capsules, dried cannabis, or as a liquid solution. CBD is thought not to be as psychoactive as THC (delta-9-tetrahydrocannabinol - the chemical in marijuana responsible for the "HIGH"). Studies suggest that CBD may interact with different biological target receptors in the body, including cannabinoid and other neurotransmitter receptors. As of 2018 the mechanism of action for its biological effects has not been determined.  Side-effects  Adverse reactions: Dry mouth, diarrhea, decreased appetite, fatigue, drowsiness, malaise, weakness, sleep disturbances, and others.  Drug interactions: CBC may interact with other medications such as blood-thinners. (Last update: 02/06/2020) ____________________________________________________________________________________________ A nerve conduction study was ordered. Three prescriptions for Oxycodone/Acetaminophen were  sent to your pharmacy.

## 2020-04-13 DIAGNOSIS — R8290 Unspecified abnormal findings in urine: Secondary | ICD-10-CM | POA: Diagnosis not present

## 2020-04-13 DIAGNOSIS — R809 Proteinuria, unspecified: Secondary | ICD-10-CM | POA: Diagnosis not present

## 2020-04-13 DIAGNOSIS — I10 Essential (primary) hypertension: Secondary | ICD-10-CM | POA: Diagnosis not present

## 2020-04-13 DIAGNOSIS — N179 Acute kidney failure, unspecified: Secondary | ICD-10-CM | POA: Diagnosis not present

## 2020-04-15 ENCOUNTER — Other Ambulatory Visit: Payer: Self-pay | Admitting: Orthopaedic Surgery

## 2020-04-15 DIAGNOSIS — E1142 Type 2 diabetes mellitus with diabetic polyneuropathy: Secondary | ICD-10-CM | POA: Diagnosis not present

## 2020-04-15 DIAGNOSIS — L851 Acquired keratosis [keratoderma] palmaris et plantaris: Secondary | ICD-10-CM | POA: Diagnosis not present

## 2020-04-18 ENCOUNTER — Other Ambulatory Visit: Payer: Self-pay

## 2020-04-18 MED ORDER — PAIN MANAGEMENT IT PUMP REFILL: 1.0000 | Freq: Once | INTRATHECAL | 0 refills | Status: AC

## 2020-04-19 ENCOUNTER — Ambulatory Visit: Payer: Medicare Other | Admitting: Internal Medicine

## 2020-04-22 ENCOUNTER — Other Ambulatory Visit: Payer: Self-pay | Admitting: Pain Medicine

## 2020-04-22 ENCOUNTER — Other Ambulatory Visit: Payer: Self-pay

## 2020-04-22 ENCOUNTER — Ambulatory Visit (HOSPITAL_COMMUNITY)
Admission: RE | Admit: 2020-04-22 | Discharge: 2020-04-22 | Disposition: A | Discharge status: Home / Self Care | Payer: Medicare Other | Source: Ambulatory Visit | Attending: Cardiology | Admitting: Cardiology

## 2020-04-22 DIAGNOSIS — M7918 Myalgia, other site: Secondary | ICD-10-CM

## 2020-04-22 DIAGNOSIS — M5441 Lumbago with sciatica, right side: Secondary | ICD-10-CM

## 2020-04-22 DIAGNOSIS — G8929 Other chronic pain: Secondary | ICD-10-CM

## 2020-04-22 DIAGNOSIS — I6523 Occlusion and stenosis of bilateral carotid arteries: Secondary | ICD-10-CM | POA: Insufficient documentation

## 2020-04-25 ENCOUNTER — Encounter: Payer: Self-pay | Admitting: Internal Medicine

## 2020-04-26 ENCOUNTER — Telehealth: Payer: Self-pay | Admitting: Cardiology

## 2020-04-26 ENCOUNTER — Ambulatory Visit: Payer: Medicare Other | Admitting: Internal Medicine

## 2020-04-26 NOTE — Telephone Encounter (Signed)
   [facility] Pre-operative Risk Assessment    HEARTCARE STAFF: - Please ensure there is not already an duplicate clearance open for this procedure. - Under Visit Info/Reason for Call, type in Other and utilize the format Clearance MM/DD/YY or Clearance TBD. Do not use dashes or single digits. - If request is for dental extraction, please clarify the # of teeth to be extracted.  Request for surgical clearance:  1. What type of surgery is being performed? Right knee arthroplasty  2. When is this surgery scheduled? 05/24/20  3. What type of clearance is required (medical clearance vs. Pharmacy clearance to hold med vs. Both)? both  4. Are there any medications that need to be held prior to surgery and how long? How long medication needs to be held   5. Practice name and name of physician performing surgery? [facility] / Dr. Melrose Nakayama  6. What is the office phone number? [phone]   7.   What is the office fax number? [phone]  8.   Anesthesia type (None, local, MAC, general) ? spinal   Melinda [name] 04/26/2020, 11:24 AM  _________________________________________________________________   (provider comments below)

## 2020-04-26 NOTE — Telephone Encounter (Signed)
Patient already has an office visit with Dr. Marlou Porch scheduled for 05/13/2020 which is before planned procedure. Therefore, pre-op risk assessment can be addressed at that time. Will route clearance form to Dr. Marlou Porch so he is aware.   Will remove from pre-op pool.   Thank you!

## 2020-04-29 ENCOUNTER — Encounter: Payer: Self-pay | Admitting: Internal Medicine

## 2020-04-29 ENCOUNTER — Other Ambulatory Visit: Payer: Self-pay

## 2020-04-29 ENCOUNTER — Ambulatory Visit (INDEPENDENT_AMBULATORY_CARE_PROVIDER_SITE_OTHER): Payer: Medicare Other | Admitting: Internal Medicine

## 2020-04-29 ENCOUNTER — Telehealth: Payer: Self-pay

## 2020-04-29 DIAGNOSIS — E1141 Type 2 diabetes mellitus with diabetic mononeuropathy: Secondary | ICD-10-CM | POA: Diagnosis not present

## 2020-04-29 DIAGNOSIS — I6523 Occlusion and stenosis of bilateral carotid arteries: Secondary | ICD-10-CM

## 2020-04-29 DIAGNOSIS — S98131A Complete traumatic amputation of one right lesser toe, initial encounter: Secondary | ICD-10-CM | POA: Diagnosis not present

## 2020-04-29 NOTE — Assessment & Plan Note (Signed)
Site looks clean

## 2020-04-29 NOTE — Progress Notes (Signed)
Subjective:    Patient ID: Melinda [name], female    DOB: 16-Jun-1956, 64 y.o.   MRN: [id number]  HPI Here for paperwork for diabetic shoes This visit occurred during the SARS-CoV-2 public health emergency.  Safety protocols were in place, including screening questions prior to the visit, additional usage of staff PPE, and extensive cleaning of exam room while observing appropriate contact time as indicated for disinfecting solutions.   Diabetes for 4 years now Stays diet controlled Lab Results  Component Value Date   HGBA1C 6.2 (H) 02/02/2020   Has had long standing neuropathy--numbness in feet Partially related to chronic pain and its treatment  Past MRSA in toes Past amputation for 1st and 2nd toes on right Past fractures in other toes  Current Outpatient Medications on File Prior to Visit  Medication Sig Dispense Refill  . ACCU-CHEK AVIVA PLUS test strip USE TO CHECK BLOOD SUGAR ONE TIME A DAY 100 strip 5  . Accu-Chek Softclix Lancets lancets USE TO CHECK BLOOD SUGAR 1 TIME DAILY 100 each 2  . AMBULATORY NON FORMULARY MEDICATION Medication Name: CPAP MASK OF CHOICE FOR HOME DEVICE 1 each 0  . Ascorbic Acid (VITAMIN C) 1000 MG tablet Take 1,000 mg by mouth daily.    Marland Kitchen aspirin 81 MG tablet Take 81 mg by mouth daily.    Marland Kitchen b complex vitamins tablet Take 1 tablet by mouth in the morning.    Marland Kitchen buPROPion (WELLBUTRIN XL) 150 MG 24 hr tablet TAKE 1 TABLET BY MOUTH EVERY DAY (Patient taking differently: Take 150 mg by mouth daily. ) 90 tablet 2  . Caffeine-Magnesium Salicylate (DIUREX PO) Take 1 tablet by mouth daily as needed.    Marland Kitchen CALCIUM-MAGNESIUM-ZINC PO Take 3 capsules by mouth daily.    Marland Kitchen CINNAMON PO Take 1,200 mg by mouth daily.     . Coenzyme Q10 (CO Q 10) 100 MG CAPS Take 100 mg by mouth daily.     . fluticasone (FLONASE) 50 MCG/ACT nasal spray Place 2 sprays into both nostrils daily as needed for allergies.     Marland Kitchen glucosamine-chondroitin 500-400 MG tablet Take 2 tablets by mouth  daily.     . Magnesium 500 MG TABS Take 500 mg by mouth at bedtime.     . Misc Natural Products (TART CHERRY ADVANCED PO) Take 1,200 mg by mouth at bedtime.     . Multiple Vitamin (MULTIVITAMIN) tablet Take 1 tablet by mouth daily.    . naloxone (NARCAN) 2 MG/2ML injection Inject 1 mL (1 mg total) into the muscle as needed for up to 2 doses (for opioid overdose). Inject content of syringe into thigh muscle. Call 911. 2 Syringe 1  . nitroGLYCERIN (NITROSTAT) 0.4 MG SL tablet Place 1 tablet (0.4 mg total) under the tongue every 5 (five) minutes as needed for chest pain. Please keep upcoming appt in October. Thank you (Patient taking differently: Place 0.4 mg under the tongue every 5 (five) minutes as needed for chest pain. ) 25 tablet 1  . NONFORMULARY OR COMPOUNDED ITEM 212.8 mcg by Epidural Infusion route. Medtronic Neuromodulation pump  Fentanyl 1,000.0 mcg/ml Baclofen 240.0 mcg/ml Bupivacaine 20.0 mg/ml Total dose 8.5 mcg/hr    . NONFORMULARY OR COMPOUNDED ITEM 88.77 mcg by Epidural Infusion route Continuous EPIDURAL. Medtronic Neuromodulation pump: Fentanyl, baclofen 88.77, bupivicane     . NONFORMULARY OR COMPOUNDED ITEM 5.918 mg by Epidural Infusion route Continuous EPIDURAL. Medtronic Neuromodulation pump: Fentanyl, Baclofen, Bupivicaine 5.918 mg     . Omega-3 Fatty  Acids (FISH OIL) 1200 MG CAPS Take 1,200 mg by mouth in the morning.     Marland Kitchen OVER THE COUNTER MEDICATION Take 1 capsule by mouth daily. Schigandra Plus    . oxyCODONE-acetaminophen (PERCOCET) 5-325 MG tablet Take 1 tablet by mouth every 8 (eight) hours as needed for severe pain. 90 tablet 0  . [START ON 05/13/2020] oxyCODONE-acetaminophen (PERCOCET) 5-325 MG tablet Take 1 tablet by mouth every 8 (eight) hours as needed for severe pain. 90 tablet 0  . [START ON 06/12/2020] oxyCODONE-acetaminophen (PERCOCET) 5-325 MG tablet Take 1 tablet by mouth every 8 (eight) hours as needed for severe pain. 90 tablet 0  . PARoxetine (PAXIL) 20 MG  tablet TAKE ONE-HALF OF A TABLET BY MOUTH DAILY IN THE MORNING (Patient taking differently: Take 10 mg by mouth in the morning. ) 90 tablet 0  . polyethylene glycol (MIRALAX / GLYCOLAX) packet Take 17 g by mouth daily.     . pregabalin (LYRICA) 150 MG capsule Take 1 capsule (150 mg total) by mouth 3 (three) times daily. 270 capsule 1  . rosuvastatin (CRESTOR) 20 MG tablet TAKE 1 TABLET BY MOUTH EVERY DAY (Patient taking differently: Take 20 mg by mouth at bedtime. ) 90 tablet 3  . tiZANidine (ZANAFLEX) 4 MG tablet Take 1 tablet (4 mg total) by mouth 3 (three) times daily. 270 tablet 1  . Vitamin E 100 units TABS Take 100 Units by mouth daily.      No current facility-administered medications on file prior to visit.    No Known Allergies  Past Medical History:  Diagnosis Date  . Amputation of great toe, right, traumatic ([place]) 05/30/2010  . Amputation of second toe, right, traumatic ([place]) 09/2017  . Anemia    years ago  . Anxiety   . Blue toes    2nd toe on right foot, will get appt.  . Bulging disc   . CAD (coronary artery disease) 2009   s/p stent to LAD  . 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  Ablations done.  Now has rare episodes  . Chicken pox   . Chronic fatigue   . Chronic kidney disease    "problem with kidney filtration"  . Coronary arteritis   . Degenerative disc disease   . Depression   . Diabetes mellitus without complication ([place])   . Facet joint disease   . Fibromyalgia   . Heart disease   . Hyperlipidemia   . IBS (irritable bowel syndrome)   . MCL deficiency, knee   . MRSA (methicillin resistant staph aureus) culture positive 2011   GREAT TOE RIGHT FOOT  . Neuropathy 04/18/2010  . Orthostatic hypotension 01/2020  . Peripheral neuropathy   . Renal insufficiency   . Restless leg syndrome   . Sepsis ([place]) 09/2013  . Sleep apnea 08/17/2003   uses CPAP, sleep study at [facility] (mild to moderate)  . Spinal stenosis     Past  Surgical History:  Procedure Laterality Date  . ABLATION     UTERUS  . ABLATION     HEART  . AMPUTATION TOE Right 02/01/2017   Procedure: AMPUTATION TOE-RIGHT 2ND MPJ;  Surgeon: Albertine Patricia, DPM;  Location: [facility];  Service: Podiatry;  Laterality: Right;  . ANTERIOR CERVICAL DECOMP/DISCECTOMY FUSION N/A 07/28/2014   Procedure: ANTERIOR CERVICAL DECOMPRESSION/DISCECTOMY FUSION CERVICAL 3-4,4-5,5-6 LEVELS WITH INSTRUMENTATION AND ALLOGRAFT;  Surgeon: Sinclair Ship, MD;  Location: [place];  Service: Orthopedics;  Laterality: N/A;  Anterior  cervical decompression fusion, cervical 3-4, cervical 4-5, cervical 5-6 with instrumentation and allograft  . BACK SURGERY     X 3 1979, 1994, 1995  . CARPAL TUNNEL RELEASE Right   . CHOLECYSTECTOMY  2003  . CORONARY ANGIOPLASTY  2008  . EYE SURGERY Bilateral 2013   Eyelid lift   . FOOT SURGERY Right    BIG TOE  . FOOT SURGERY Bilateral    PLANTAR FASCIITIS  . FOOT SURGERY Right    2ND TOE  . GALLBLADDER SURGERY    . HAMMER TOE SURGERY Right 10/17/2017   Procedure: HAMMER TOE CORRECTION-4TH TOE;  Surgeon: Albertine Patricia, DPM;  Location: [place];  Service: Podiatry;  Laterality: Right;  LMA- WITH LOCAL Diabetic - diet controlled  . HAND SURGERY Left   . HAND SURGEY Left   . HEART STENT  2009   LAD  . INFUSION PUMP IMPLANTATION     X2 with morphine and baclofen  . INTRATHECAL PUMP REVISION N/A 04/25/2018   Procedure: Intrathecal pump replacement;  Surgeon: Clydell Hakim, MD;  Location: [place];  Service: Neurosurgery;  Laterality: N/A;  right  . INTRATHECAL PUMP REVISION Right 04/25/2018  . INTRATHECAL PUMP REVISION N/A 12/12/2018   Procedure: Intrathecal pump revision with exploration of pocket;  Surgeon: Clydell Hakim, MD;  Location: [place];  Service: Neurosurgery;  Laterality: N/A;  Intrathecal pump revision with exploration of pocket  . INTRATHECAL PUMP REVISION N/A 03/11/2020   Procedure: INTRATHECAL PUMP REVISION;  Surgeon:  [name], MD;  Location: [facility];  Service: Neurosurgery;  Laterality: N/A;  . IRRIGATION AND DEBRIDEMENT FOOT Right 02/23/2017   Procedure: IRRIGATION AND DEBRIDEMENT FOOT-right foot;  Surgeon: Samara Deist, DPM;  Location: [facility];  Service: Podiatry;  Laterality: Right;  . KNEE SURGERY Right 05/24/2020  . PAIN PUMP REVISION N/A 08/15/2018   Procedure: Intrathecal PUMP REVISION;  Surgeon: Clydell Hakim, MD;  Location: [place];  Service: Neurosurgery;  Laterality: N/A;  INTRATHECAL PUMP REVISION  . TOE SURGERY     then revision 8/18    Family History  Problem Relation Age of Onset  . Lung cancer Mother   . Diabetes Father   . Heart disease Maternal Grandfather   . Diabetes Paternal Grandmother   . Colon cancer Paternal Grandfather   . Stroke Neg Hx     Social History   Socioeconomic History  . Marital status: Significant Other    Spouse name: Not on file  . Number of children: Not on file  . Years of education: Not on file  . Highest education level: Not on file  Occupational History  . Not on file  Tobacco Use  . Smoking status: Former Smoker    Packs/day: 1.50    Years: 32.00    Pack years: 48.00    Types: Cigarettes    Quit date: 07/22/2007    Years since quitting: 12.7  . Smokeless tobacco: Never Used  Vaping Use  . Vaping Use: Never used  Substance and Sexual Activity  . Alcohol use: Yes    Alcohol/week: 0.0 standard drinks    Comment: occ - Holidays  . Drug use: Yes    Comment: prescribed pain pump and oxy  . Sexual activity: Yes  Other Topics Concern  . Not on file  Social History Narrative   Lives at home with a partner. Independent at baseline   Social Determinants of Health   Financial Resource Strain:   . Difficulty of Paying Living Expenses: Not on file  Food Insecurity:   . Worried About Charity fundraiser in the Last Year: Not on file  . Ran Out of Food in the Last Year: Not on file  Transportation Needs:   . Lack of Transportation  (Medical): Not on file  . Lack of Transportation (Non-Medical): Not on file  Physical Activity:   . Days of Exercise per Week: Not on file  . Minutes of Exercise per Session: Not on file  Stress:   . Feeling of Stress : Not on file  Social Connections:   . Frequency of Communication with Friends and Family: Not on file  . Frequency of Social Gatherings with Friends and Family: Not on file  . Attends Religious Services: Not on file  . Active Member of Clubs or Organizations: Not on file  . Attends Archivist Meetings: Not on file  . Marital Status: Not on file  Intimate Partner Violence:   . Fear of Current or Ex-Partner: Not on file  . Emotionally Abused: Not on file  . Physically Abused: Not on file  . Sexually Abused: Not on file   Review of Systems     Objective:   Physical Exam Musculoskeletal:     Comments: Right great and 2nd toes are missing---sites are clean and dry Multiple hammertoe deformities and callous on left great toe  Neurological:     Comments: Decreased sensation in feet            Assessment & Plan:

## 2020-04-29 NOTE — Assessment & Plan Note (Signed)
Control has been good  Advanced neuropathy with deformities, callous, etc Definitely needs the diabetic shoes and inserts

## 2020-04-29 NOTE — Telephone Encounter (Signed)
Pt picked up handicap placard in office today

## 2020-05-01 ENCOUNTER — Other Ambulatory Visit: Payer: Self-pay | Admitting: Internal Medicine

## 2020-05-03 DIAGNOSIS — E119 Type 2 diabetes mellitus without complications: Secondary | ICD-10-CM | POA: Diagnosis not present

## 2020-05-05 ENCOUNTER — Other Ambulatory Visit: Payer: Self-pay | Admitting: Pain Medicine

## 2020-05-05 ENCOUNTER — Telehealth: Payer: Self-pay | Admitting: Pain Medicine

## 2020-05-05 ENCOUNTER — Telehealth: Payer: Self-pay | Admitting: *Deleted

## 2020-05-05 DIAGNOSIS — G8929 Other chronic pain: Secondary | ICD-10-CM

## 2020-05-05 DIAGNOSIS — F1190 Opioid use, unspecified, uncomplicated: Secondary | ICD-10-CM

## 2020-05-05 DIAGNOSIS — Z79891 Long term (current) use of opiate analgesic: Secondary | ICD-10-CM

## 2020-05-05 DIAGNOSIS — M797 Fibromyalgia: Secondary | ICD-10-CM

## 2020-05-05 DIAGNOSIS — M7918 Myalgia, other site: Secondary | ICD-10-CM

## 2020-05-05 MED ORDER — TIZANIDINE HCL 4 MG PO TABS: 4.0000 mg | ORAL_TABLET | Freq: Three times a day (TID) | ORAL | 2 refills | Status: DC

## 2020-05-05 MED ORDER — PREGABALIN 150 MG PO CAPS: 150.0000 mg | ORAL_CAPSULE | Freq: Three times a day (TID) | ORAL | 2 refills | Status: DC

## 2020-05-05 MED ORDER — NALOXONE HCL 2 MG/2ML IJ SOSY: 1.0000 mg | PREFILLED_SYRINGE | INTRAMUSCULAR | 0 refills | Status: DC | PRN

## 2020-05-05 NOTE — Telephone Encounter (Signed)
Lyrica was not sent to pharmacy when she came in for med mgmt appt. Please send Lyrica script, patient will be out this weekend. Please let Janette know when she can pick up

## 2020-05-05 NOTE — Telephone Encounter (Signed)
Patient informed that scripts for Lyrica and Tizanidine have been sent to her pharmacy.

## 2020-05-10 ENCOUNTER — Telehealth: Payer: Self-pay | Admitting: Internal Medicine

## 2020-05-10 NOTE — Care Plan (Signed)
Ortho Bundle Case Management Note  Patient Details  Name: Melinda [name] MRN: [id number] Date of Birth: [birth year]  Spoke with patient prior to surgery. She will discharge to home with family to assist.  Rolling walker ordered for home. HHPT referral to [facility] and OPPT set up at [facility]. Patient and MD in agreement with plan. Choice offered.                    DME Arranged:  [place] [facility] rolling DME Agency:  [facility]  HH Arranged:  PT [place] Agency:  [facility] (formerly [facility])  Additional Comments: Please contact me with any questions of if this plan should need to change.  Ladell Heads,  [place] Specialist  [phone] 05/10/2020, 11:30 AM

## 2020-05-11 ENCOUNTER — Encounter: Payer: Self-pay | Admitting: Internal Medicine

## 2020-05-13 ENCOUNTER — Encounter: Payer: Self-pay | Admitting: Cardiology

## 2020-05-13 ENCOUNTER — Ambulatory Visit (INDEPENDENT_AMBULATORY_CARE_PROVIDER_SITE_OTHER): Payer: Medicare Other | Admitting: Cardiology

## 2020-05-13 ENCOUNTER — Other Ambulatory Visit: Payer: Self-pay

## 2020-05-13 VITALS — BP 150/80 | HR 76 | Ht 69.0 in | Wt 230.0 lb

## 2020-05-13 DIAGNOSIS — G894 Chronic pain syndrome: Secondary | ICD-10-CM

## 2020-05-13 DIAGNOSIS — I2510 Atherosclerotic heart disease of native coronary artery without angina pectoris: Secondary | ICD-10-CM

## 2020-05-13 DIAGNOSIS — I6523 Occlusion and stenosis of bilateral carotid arteries: Secondary | ICD-10-CM

## 2020-05-13 DIAGNOSIS — I2583 Coronary atherosclerosis due to lipid rich plaque: Secondary | ICD-10-CM

## 2020-05-13 DIAGNOSIS — I951 Orthostatic hypotension: Secondary | ICD-10-CM | POA: Diagnosis not present

## 2020-05-13 NOTE — Progress Notes (Signed)
Cardiology Office Note:    Date:  05/13/2020   ID:  Melinda [name], DOB 11/19/1955, MRN [id number]  PCP:  Jearld Fenton, NP  [facility] Cardiologist:  No primary care provider on file.  [facility] Electrophysiologist:  None   Referring MD: Jearld Fenton, NP    History of Present Illness:    Melinda [name] is a 64 y.o. female here for follow-up of orthostatic hypotension.  At times her blood pressure can be extremely low at 1 point was placed on midodrine.  Multiple near syncopal episodes.  Systolics have been in the 58s.  Overall she been doing very well.  In fact she is off of the midodrine.  She did have a toe infection, treated effectively.  She is feeling much better now.  She had some hypotension surrounding this infection.  Blood pressure seem to be very well controlled currently.  No anginal symptoms.  She is having knee pain.   Past Medical History:  Diagnosis Date  . Amputation of great toe, right, traumatic ([place]) 05/30/2010  . Amputation of second toe, right, traumatic ([place]) 09/2017  . Anemia    years ago  . Anxiety   . Blue toes    2nd toe on right foot, will get appt.  . Bulging disc   . CAD (coronary artery disease) 2009   s/p stent to LAD  . 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  Ablations done.  Now has rare episodes  . Chicken pox   . Chronic fatigue   . Chronic kidney disease    "problem with kidney filtration"  . Coronary arteritis   . Degenerative disc disease   . Depression   . Diabetes mellitus without complication ([place])   . Facet joint disease   . Fibromyalgia   . Heart disease   . Hyperlipidemia   . IBS (irritable bowel syndrome)   . MCL deficiency, knee   . MRSA (methicillin resistant staph aureus) culture positive 2011   GREAT TOE RIGHT FOOT  . Neuropathy 04/18/2010  . Orthostatic hypotension 01/2020  . Peripheral neuropathy   . Renal insufficiency   . Restless leg syndrome   . Sepsis ([place]) 09/2013   . Sleep apnea 08/17/2003   uses CPAP, sleep study at [facility] (mild to moderate)  . Spinal stenosis     Past Surgical History:  Procedure Laterality Date  . ABLATION     UTERUS  . ABLATION     HEART  . AMPUTATION TOE Right 02/01/2017   Procedure: AMPUTATION TOE-RIGHT 2ND MPJ;  Surgeon: Albertine Patricia, DPM;  Location: [facility];  Service: Podiatry;  Laterality: Right;  . ANTERIOR CERVICAL DECOMP/DISCECTOMY FUSION N/A 07/28/2014   Procedure: ANTERIOR CERVICAL DECOMPRESSION/DISCECTOMY FUSION CERVICAL 3-4,4-5,5-6 LEVELS WITH INSTRUMENTATION AND ALLOGRAFT;  Surgeon: Sinclair Ship, MD;  Location: [place];  Service: Orthopedics;  Laterality: N/A;  Anterior cervical decompression fusion, cervical 3-4, cervical 4-5, cervical 5-6 with instrumentation and allograft  . BACK SURGERY     X 3 1979, 1994, 1995  . CARPAL TUNNEL RELEASE Right   . CHOLECYSTECTOMY  2003  . CORONARY ANGIOPLASTY  2008  . EYE SURGERY Bilateral 2013   Eyelid lift   . FOOT SURGERY Right    BIG TOE  . FOOT SURGERY Bilateral    PLANTAR FASCIITIS  . FOOT SURGERY Right    2ND TOE  . GALLBLADDER SURGERY    . HAMMER TOE SURGERY Right 10/17/2017   Procedure: HAMMER TOE  CORRECTION-4TH TOE;  Surgeon: Albertine Patricia, DPM;  Location: [place];  Service: Podiatry;  Laterality: Right;  LMA- WITH LOCAL Diabetic - diet controlled  . HAND SURGERY Left   . HAND SURGEY Left   . HEART STENT  2009   LAD  . INFUSION PUMP IMPLANTATION     X2 with morphine and baclofen  . INTRATHECAL PUMP REVISION N/A 04/25/2018   Procedure: Intrathecal pump replacement;  Surgeon: Clydell Hakim, MD;  Location: [place];  Service: Neurosurgery;  Laterality: N/A;  right  . INTRATHECAL PUMP REVISION Right 04/25/2018  . INTRATHECAL PUMP REVISION N/A 12/12/2018   Procedure: Intrathecal pump revision with exploration of pocket;  Surgeon: Clydell Hakim, MD;  Location: [place];  Service: Neurosurgery;  Laterality: N/A;  Intrathecal pump  revision with exploration of pocket  . INTRATHECAL PUMP REVISION N/A 03/11/2020   Procedure: INTRATHECAL PUMP REVISION;  Surgeon: [name], MD;  Location: [facility];  Service: Neurosurgery;  Laterality: N/A;  . IRRIGATION AND DEBRIDEMENT FOOT Right 02/23/2017   Procedure: IRRIGATION AND DEBRIDEMENT FOOT-right foot;  Surgeon: Samara Deist, DPM;  Location: [facility];  Service: Podiatry;  Laterality: Right;  . KNEE SURGERY Right 05/24/2020  . PAIN PUMP REVISION N/A 08/15/2018   Procedure: Intrathecal PUMP REVISION;  Surgeon: Clydell Hakim, MD;  Location: [place];  Service: Neurosurgery;  Laterality: N/A;  INTRATHECAL PUMP REVISION  . TOE SURGERY     then revision 8/18    Current Medications: Current Meds  Medication Sig  . ACCU-CHEK AVIVA PLUS test strip USE TO CHECK BLOOD SUGAR ONE TIME A DAY  . Accu-Chek Softclix Lancets lancets USE TO CHECK BLOOD SUGAR 1 TIME DAILY  . AMBULATORY NON FORMULARY MEDICATION Medication Name: CPAP MASK OF CHOICE FOR HOME DEVICE  . Ascorbic Acid (VITAMIN C) 1000 MG tablet Take 1,000 mg by mouth daily.  Marland Kitchen aspirin 81 MG tablet Take 81 mg by mouth daily.  Marland Kitchen b complex vitamins tablet Take 1 tablet by mouth in the morning.  Marland Kitchen buPROPion (WELLBUTRIN XL) 150 MG 24 hr tablet TAKE 1 TABLET BY MOUTH EVERY DAY  . Caffeine-Magnesium Salicylate (DIUREX PO) Take 1 tablet by mouth daily as needed.   Marland Kitchen CALCIUM-MAGNESIUM-ZINC PO Take 3 capsules by mouth daily.  Marland Kitchen CINNAMON PO Take 1,200 mg by mouth daily.   . Coenzyme Q10 (CO Q 10) 100 MG CAPS Take 100 mg by mouth daily.   . fluticasone (FLONASE) 50 MCG/ACT nasal spray Place 2 sprays into both nostrils daily as needed for allergies.   Marland Kitchen glucosamine-chondroitin 500-400 MG tablet Take 2 tablets by mouth daily.   . Magnesium 500 MG TABS Take 500 mg by mouth at bedtime.   . Misc Natural Products (TART CHERRY ADVANCED PO) Take 1,200 mg by mouth at bedtime.   . Multiple Vitamin (MULTIVITAMIN) tablet Take 1 tablet by mouth daily.   . naloxone (NARCAN) 2 MG/2ML injection Inject 1 mL (1 mg total) into the muscle as needed for up to 2 doses (for opioid overdose). In case of emergency (overdose), inject into muscle of upper arm or leg and call 911.  . nitroGLYCERIN (NITROSTAT) 0.4 MG SL tablet Place 1 tablet (0.4 mg total) under the tongue every 5 (five) minutes as needed for chest pain. Please keep upcoming appt in October. Thank you  . NONFORMULARY OR COMPOUNDED ITEM 212.8 mcg by Epidural Infusion route. Medtronic Neuromodulation pump  Fentanyl 1,000.0 mcg/ml Baclofen 240.0 mcg/ml Bupivacaine 20.0 mg/ml Total dose 8.5 mcg/hr  . NONFORMULARY OR COMPOUNDED  ITEM 88.77 mcg by Epidural Infusion route Continuous EPIDURAL. Medtronic Neuromodulation pump: Fentanyl, baclofen 88.77, bupivicane   . NONFORMULARY OR COMPOUNDED ITEM 5.918 mg by Epidural Infusion route Continuous EPIDURAL. Medtronic Neuromodulation pump: Fentanyl, Baclofen, Bupivicaine 5.918 mg   . Omega-3 Fatty Acids (FISH OIL) 1200 MG CAPS Take 1,200 mg by mouth in the morning.   Marland Kitchen OVER THE COUNTER MEDICATION Take 1 capsule by mouth daily. Schigandra Plus  . oxyCODONE-acetaminophen (PERCOCET) 5-325 MG tablet Take 1 tablet by mouth every 8 (eight) hours as needed for severe pain.  Marland Kitchen PARoxetine (PAXIL) 20 MG tablet TAKE 0.5 TABLET BY MOUTH DAILY IN THE MORNING  . polyethylene glycol (MIRALAX / GLYCOLAX) packet Take 17 g by mouth daily.   . pregabalin (LYRICA) 150 MG capsule Take 1 capsule (150 mg total) by mouth 3 (three) times daily.  . rosuvastatin (CRESTOR) 20 MG tablet TAKE 1 TABLET BY MOUTH EVERY DAY  . tiZANidine (ZANAFLEX) 4 MG tablet Take 1 tablet (4 mg total) by mouth 3 (three) times daily.  . Vitamin E 100 units TABS Take 100 Units by mouth daily.      Allergies:   Patient has no known allergies.   Social History   Socioeconomic History  . Marital status: Significant Other    Spouse name: Not on file  . Number of children: Not on file  . Years of  education: Not on file  . Highest education level: Not on file  Occupational History  . Not on file  Tobacco Use  . Smoking status: Former Smoker    Packs/day: 1.50    Years: 32.00    Pack years: 48.00    Types: Cigarettes    Quit date: 07/22/2007    Years since quitting: 12.8  . Smokeless tobacco: Never Used  Vaping Use  . Vaping Use: Never used  Substance and Sexual Activity  . Alcohol use: Yes    Alcohol/week: 0.0 standard drinks    Comment: occ - Holidays  . Drug use: Yes    Comment: prescribed pain pump and oxy  . Sexual activity: Yes  Other Topics Concern  . Not on file  Social History Narrative   Lives at home with a partner. Independent at baseline   Social Determinants of Health   Financial Resource Strain:   . Difficulty of Paying Living Expenses: Not on file  Food Insecurity:   . Worried About Charity fundraiser in the Last Year: Not on file  . Ran Out of Food in the Last Year: Not on file  Transportation Needs:   . Lack of Transportation (Medical): Not on file  . Lack of Transportation (Non-Medical): Not on file  Physical Activity:   . Days of Exercise per Week: Not on file  . Minutes of Exercise per Session: Not on file  Stress:   . Feeling of Stress : Not on file  Social Connections:   . Frequency of Communication with Friends and Family: Not on file  . Frequency of Social Gatherings with Friends and Family: Not on file  . Attends Religious Services: Not on file  . Active Member of Clubs or Organizations: Not on file  . Attends Archivist Meetings: Not on file  . Marital Status: Not on file     Family History: The patient's family history includes Colon cancer in her paternal grandfather; Diabetes in her father and paternal grandmother; Heart disease in her maternal grandfather; Lung cancer in her mother. There is no history of  Stroke.  ROS:   Please see the history of present illness.     All other systems reviewed and are  negative.  EKGs/Labs/Other Studies Reviewed:     Recent Labs: 02/02/2020: TSH 1.965 04/01/2020: ALT 14 04/02/2020: BUN 25; Creatinine, Ser 0.99; Hemoglobin 11.1; Platelets 197; Potassium 4.3; Sodium 138  Recent Lipid Panel    Component Value Date/Time   CHOL 102 03/12/2019 1255   TRIG 158.0 (H) 03/12/2019 1255   HDL 34.30 (L) 03/12/2019 1255   CHOLHDL 3 03/12/2019 1255   VLDL 31.6 03/12/2019 1255   LDLCALC 36 03/12/2019 1255   LDLDIRECT 63.0 11/04/2017 1326    Physical Exam:    VS:  BP (!) 150/80   Pulse 76   Ht 5\' 9"  (1.753 m)   Wt 230 lb (104.3 kg)   LMP  (LMP Unknown)   SpO2 97%   BMI 33.97 kg/m     Wt Readings from Last 3 Encounters:  05/13/20 230 lb (104.3 kg)  04/29/20 226 lb (102.5 kg)  04/12/20 220 lb (99.8 kg)     GEN:  Well nourished, well developed in no acute distress HEENT: Normal NECK: No JVD; No carotid bruits LYMPHATICS: No lymphadenopathy CARDIAC: RRR, no murmurs, rubs, gallops RESPIRATORY:  Clear to auscultation without rales, wheezing or rhonchi  ABDOMEN: Soft, non-tender, non-distended, pain pump incision noted. MUSCULOSKELETAL:  No edema; No deformity  SKIN: Warm and dry NEUROLOGIC:  Alert and oriented x 3 PSYCHIATRIC:  Normal affect   ASSESSMENT:    1. Coronary artery disease due to lipid rich plaque   2. Orthostatic hypotension   3. Chronic pain syndrome    PLAN:    In order of problems listed above:  Orthostatic hypotension -Blood pressure today 150/80.  Previously stopped her midodrine.  She also slowly tapered off the Cymbalta.  Her pain pump was revised.  Endocrine has seen her for possible hormonal imbalance. That she did have a hypotensive episode to 90/50 Had recent fever up to 102 toe infection. Podiatry had seen, Dr. Luana Shu. Has had prior right toe amputations.   Chronic pain -Pain pump revised.  Currently on low-dose.  Her doctor is trying to alleviate oral meds and continue with pain pump only if possible  Coronary  artery disease -2009 DES to the LAD. -2012 cardiac catheterization repeat patent stent. -Secondary prevention strategies.  She is on aspirin Crestor.  Knee osteoarthritis -She may hold her aspirin for 5 to 7 days prior to knee procedure if necessary.  She may proceed with low overall cardiac risk.  She is able to complete 4 METS of activity.  No anginal symptoms.    Medication Adjustments/Labs and Tests Ordered: Current medicines are reviewed at length with the patient today.  Concerns regarding medicines are outlined above.  No orders of the defined types were placed in this encounter.  No orders of the defined types were placed in this encounter.   Patient Instructions  Medication Instructions:  The current medical regimen is effective;  continue present plan and medications.  *If you need a refill on your cardiac medications before your next appointment, please call your pharmacy*  Follow-Up: At [facility], you and your health needs are our priority.  As part of our continuing mission to provide you with exceptional heart care, we have created designated Provider Care Teams.  These Care Teams include your primary Cardiologist (physician) and Advanced Practice Providers (APPs -  Physician Assistants and Nurse Practitioners) who all work together to provide you  with the care you need, when you need it.  We recommend signing up for the patient portal called "MyChart".  Sign up information is provided on this After Visit Summary.  MyChart is used to connect with patients for Virtual Visits (Telemedicine).  Patients are able to view lab/test results, encounter notes, upcoming appointments, etc.  Non-urgent messages can be sent to your provider as well.   To learn more about what you can do with MyChart, go to [email or web address].    Your next appointment:   12 month(s)  The format for your next appointment:   In Person  Provider:   Candee Furbish, MD   Thank you for choosing  [facility]!!        Signed, Candee Furbish, MD  05/13/2020 3:54 PM    [place]

## 2020-05-13 NOTE — Patient Instructions (Signed)
Medication Instructions:  The current medical regimen is effective;  continue present plan and medications.  *If you need a refill on your cardiac medications before your next appointment, please call your pharmacy*  Follow-Up: At [facility], you and your health needs are our priority.  As part of our continuing mission to provide you with exceptional heart care, we have created designated Provider Care Teams.  These Care Teams include your primary Cardiologist (physician) and Advanced Practice Providers (APPs -  Physician Assistants and Nurse Practitioners) who all work together to provide you with the care you need, when you need it.  We recommend signing up for the patient portal called "MyChart".  Sign up information is provided on this After Visit Summary.  MyChart is used to connect with patients for Virtual Visits (Telemedicine).  Patients are able to view lab/test results, encounter notes, upcoming appointments, etc.  Non-urgent messages can be sent to your provider as well.   To learn more about what you can do with MyChart, go to [email or web address].    Your next appointment:   12 month(s)  The format for your next appointment:   In Person  Provider:   Mark Skains, MD   Thank you for choosing  [facility]!!      

## 2020-05-16 DIAGNOSIS — G4733 Obstructive sleep apnea (adult) (pediatric): Secondary | ICD-10-CM | POA: Diagnosis not present

## 2020-05-16 DIAGNOSIS — M797 Fibromyalgia: Secondary | ICD-10-CM | POA: Diagnosis not present

## 2020-05-16 DIAGNOSIS — R2689 Other abnormalities of gait and mobility: Secondary | ICD-10-CM | POA: Diagnosis not present

## 2020-05-16 DIAGNOSIS — M5416 Radiculopathy, lumbar region: Secondary | ICD-10-CM | POA: Diagnosis not present

## 2020-05-16 DIAGNOSIS — G5602 Carpal tunnel syndrome, left upper limb: Secondary | ICD-10-CM | POA: Diagnosis not present

## 2020-05-16 DIAGNOSIS — M47812 Spondylosis without myelopathy or radiculopathy, cervical region: Secondary | ICD-10-CM | POA: Diagnosis not present

## 2020-05-17 ENCOUNTER — Encounter: Payer: Self-pay | Admitting: Internal Medicine

## 2020-05-17 ENCOUNTER — Ambulatory Visit (INDEPENDENT_AMBULATORY_CARE_PROVIDER_SITE_OTHER): Payer: Medicare Other | Admitting: Internal Medicine

## 2020-05-17 ENCOUNTER — Other Ambulatory Visit: Payer: Self-pay

## 2020-05-17 VITALS — BP 150/100 | HR 68 | Ht 69.0 in | Wt 232.2 lb

## 2020-05-17 DIAGNOSIS — I951 Orthostatic hypotension: Secondary | ICD-10-CM | POA: Diagnosis not present

## 2020-05-17 DIAGNOSIS — I6523 Occlusion and stenosis of bilateral carotid arteries: Secondary | ICD-10-CM | POA: Diagnosis not present

## 2020-05-17 DIAGNOSIS — H04123 Dry eye syndrome of bilateral lacrimal glands: Secondary | ICD-10-CM | POA: Diagnosis not present

## 2020-05-17 NOTE — Progress Notes (Signed)
Patient ID: Melinda [name], female   DOB: 02-19-1956, 64 y.o.   MRN: [id number]   This visit occurred during the SARS-CoV-2 public health emergency.  Safety protocols were in place, including screening questions prior to the visit, additional usage of staff PPE, and extensive cleaning of exam room while observing appropriate contact time as indicated for disinfecting solutions.   HPI  Melinda [name] is a 64 y.o.-year-old female, referred by her cardiologist, Dr. Marlou Porch, for evaluation for orthostatic hypotension.  Pt reports increased fatigue in 01/2020 and felt presyncopal when standing up.  After experiencing this several times, she went to the [facility] ED >> she was very orthostatic at the moment despite not being on any BP medications. She was started on a low dose of Midodrine and was referred back to Dr. Marlou Porch.  When she saw Dr. Marlou Porch her blood pressure was higher than she did not feel orthostatic so her midodrine was stopped. She did not have any more of the low BP/orthostasis episodes since then.  Also, she does not report shortness of breath or CP (she has a h/o heart stent).  She had carotid U/S recently >> no significant obstruction.  She describes having fibromyalgia, chronic back pain 2/2 DDD (surgeries including cervical fusion) >> chronic pain >> seen in the pain clinic. She had an intrathecal pump placed in 2006. The pain med provider is deescalating her regimen while maintaining her intrathecal pump.  She is on bupivacaine, fentanyl, also on baclofen, Lyrica, oxycodone/acetaminophen 5/325 initially q8h, now twice a day most days.  She has been on steroid inj's for joint pain - back, knees - she believes the last inj was 2 years ago, but was using these repeatedly in the past..   She was seen by neurology - reviewed the results of her EMG yesterday: Low back pain and imbalance - multifactorial - sensory ataxia + sensorimotor neuropathy + chronic pain syndrome  EMG/NCS - lower extremity  (07/2019): This is an abnormal electrodiagnostic study consistent with a generalized sensorimotor polyneuropathy  She has a history of controlled type 2 diabetes, with CAD  - s/p stent, carotid artery disease, stage III CKD, PN, history of toe ulcer and also amputation right foot toe. Lab Results  Component Value Date   HGBA1C 6.2 (H) 02/02/2020   HGBA1C 6.0 03/12/2019   HGBA1C 6.0 (H) 12/10/2018   HGBA1C 5.9 (H) 08/08/2018   HGBA1C 5.7 (H) 04/17/2018   HGBA1C 6.2 02/19/2018   HGBA1C 6.5 11/04/2017   HGBA1C 6.4 08/06/2017   HGBA1C 5.8 (H) 02/22/2017   HGBA1C 6.0 05/18/2016   No h/o Depo-provera, Megace, po ketoconazole, phenytoin, rifampin, chronic fluconazole use. No h/o autoimmune diseases in pt or family mbs. + excess use of NSAIDs for her pain  - 600 mg once every other day + h/o generalized infection in 2003-2004  - sepsis No HIV. No IVDA. No h/o head injury or severe HA. No h/o malignancy.  Pt mentions: - + weight gain - + fatigue - no nausea - no vomiting - no abdominal pain - + Constipation - + mm and joint aches - no palpitations - no HAs - no dizziness - no syncopal episodes - no dark skin discoloration  No h/o recurring hyponatremia or hyperkalemia.   Chemistry      Component Value Date/Time   NA 138 04/02/2020 1041   NA 135 (L) 06/30/2013 1203   K 4.3 04/02/2020 1041   K 4.1 06/30/2013 1203   CL 102 04/02/2020 1041   CL  102 06/30/2013 1203   CO2 29 04/02/2020 1041   CO2 32 06/30/2013 1203   BUN 25 (H) 04/02/2020 1041   BUN 19 (H) 06/30/2013 1203   CREATININE 0.99 04/02/2020 1041   CREATININE 0.89 06/30/2013 1203      Component Value Date/Time   CALCIUM 9.0 04/02/2020 1041   CALCIUM 9.7 06/30/2013 1203   ALKPHOS 82 04/01/2020 1751   AST 20 04/01/2020 1751   ALT 14 04/01/2020 1751   BILITOT 0.7 04/01/2020 1751     ROS: Constitutional: + weight gain in last 2-4 lbs, + see HPI Eyes: no blurry vision, no xerophthalmia ENT: no sore throat, no  nodules palpated in throat, no dysphagia/odynophagia, no hoarseness Cardiovascular: no CP/SOB/palpitations/leg swelling Respiratory: no cough/SOB Gastrointestinal: no N/V/D/ + C/no AP/no acid reflux Musculoskeletal: no muscle/joint aches Skin: no rashes Neurological: no tremors/numbness/tingling/+ resolved dizziness Psychiatric: no depression/+ anxiety  Past Medical History:  Diagnosis Date  . Amputation of great toe, right, traumatic ([place]) 05/30/2010  . Amputation of second toe, right, traumatic ([place]) 09/2017  . Anemia    years ago  . Anxiety   . Blue toes    2nd toe on right foot, will get appt.  . Bulging disc   . CAD (coronary artery disease) 2009   s/p stent to LAD  . 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  Ablations done.  Now has rare episodes  . Chicken pox   . Chronic fatigue   . Chronic kidney disease    "problem with kidney filtration"  . Coronary arteritis   . Degenerative disc disease   . Depression   . Diabetes mellitus without complication ([place])   . Facet joint disease   . Fibromyalgia   . Heart disease   . Hyperlipidemia   . IBS (irritable bowel syndrome)   . MCL deficiency, knee   . MRSA (methicillin resistant staph aureus) culture positive 2011   GREAT TOE RIGHT FOOT  . Neuropathy 04/18/2010  . Orthostatic hypotension 01/2020  . Peripheral neuropathy   . Renal insufficiency   . Restless leg syndrome   . Sepsis ([place]) 09/2013  . Sleep apnea 08/17/2003   uses CPAP, sleep study at [facility] (mild to moderate)  . Spinal stenosis    Past Surgical History:  Procedure Laterality Date  . ABLATION     UTERUS  . ABLATION     HEART  . AMPUTATION TOE Right 02/01/2017   Procedure: AMPUTATION TOE-RIGHT 2ND MPJ;  Surgeon: Albertine Patricia, DPM;  Location: [facility];  Service: Podiatry;  Laterality: Right;  . ANTERIOR CERVICAL DECOMP/DISCECTOMY FUSION N/A 07/28/2014   Procedure: ANTERIOR CERVICAL DECOMPRESSION/DISCECTOMY FUSION  CERVICAL 3-4,4-5,5-6 LEVELS WITH INSTRUMENTATION AND ALLOGRAFT;  Surgeon: Sinclair Ship, MD;  Location: [place];  Service: Orthopedics;  Laterality: N/A;  Anterior cervical decompression fusion, cervical 3-4, cervical 4-5, cervical 5-6 with instrumentation and allograft  . BACK SURGERY     X 3 1979, 1994, 1995  . CARPAL TUNNEL RELEASE Right   . CHOLECYSTECTOMY  2003  . CORONARY ANGIOPLASTY  2008  . EYE SURGERY Bilateral 2013   Eyelid lift   . FOOT SURGERY Right    BIG TOE  . FOOT SURGERY Bilateral    PLANTAR FASCIITIS  . FOOT SURGERY Right    2ND TOE  . GALLBLADDER SURGERY    . HAMMER TOE SURGERY Right 10/17/2017   Procedure: HAMMER TOE CORRECTION-4TH TOE;  Surgeon: Albertine Patricia, DPM;  Location: [place];  Service: Podiatry;  Laterality: Right;  LMA- WITH LOCAL Diabetic - diet controlled  . HAND SURGERY Left   . HAND SURGEY Left   . HEART STENT  2009   LAD  . INFUSION PUMP IMPLANTATION     X2 with morphine and baclofen  . INTRATHECAL PUMP REVISION N/A 04/25/2018   Procedure: Intrathecal pump replacement;  Surgeon: Clydell Hakim, MD;  Location: [place];  Service: Neurosurgery;  Laterality: N/A;  right  . INTRATHECAL PUMP REVISION Right 04/25/2018  . INTRATHECAL PUMP REVISION N/A 12/12/2018   Procedure: Intrathecal pump revision with exploration of pocket;  Surgeon: Clydell Hakim, MD;  Location: [place];  Service: Neurosurgery;  Laterality: N/A;  Intrathecal pump revision with exploration of pocket  . INTRATHECAL PUMP REVISION N/A 03/11/2020   Procedure: INTRATHECAL PUMP REVISION;  Surgeon: [name], MD;  Location: [facility];  Service: Neurosurgery;  Laterality: N/A;  . IRRIGATION AND DEBRIDEMENT FOOT Right 02/23/2017   Procedure: IRRIGATION AND DEBRIDEMENT FOOT-right foot;  Surgeon: Samara Deist, DPM;  Location: [facility];  Service: Podiatry;  Laterality: Right;  . KNEE SURGERY Right 05/24/2020  . PAIN PUMP REVISION N/A 08/15/2018   Procedure: Intrathecal PUMP  REVISION;  Surgeon: Clydell Hakim, MD;  Location: [place];  Service: Neurosurgery;  Laterality: N/A;  INTRATHECAL PUMP REVISION  . TOE SURGERY     then revision 8/18   Social History   Socioeconomic History  . Marital status: Significant Other    Spouse name: Not on file  . Number of children: Not on file  . Years of education: Not on file  . Highest education level: Not on file  Occupational History  . Not on file  Tobacco Use  . Smoking status: Former Smoker    Packs/day: 1.50    Years: 32.00    Pack years: 48.00    Types: Cigarettes    Quit date: 07/22/2007    Years since quitting: 12.8  . Smokeless tobacco: Never Used  Vaping Use  . Vaping Use: Never used  Substance and Sexual Activity  . Alcohol use: Yes    Alcohol/week: 0.0 standard drinks    Comment: occ - Holidays  . Drug use: Yes    Comment: prescribed pain pump and oxy  . Sexual activity: Yes  Other Topics Concern  . Not on file  Social History Narrative   Lives at home with a partner. Independent at baseline   Social Determinants of Health   Financial Resource Strain:   . Difficulty of Paying Living Expenses: Not on file  Food Insecurity:   . Worried About Charity fundraiser in the Last Year: Not on file  . Ran Out of Food in the Last Year: Not on file  Transportation Needs:   . Lack of Transportation (Medical): Not on file  . Lack of Transportation (Non-Medical): Not on file  Physical Activity:   . Days of Exercise per Week: Not on file  . Minutes of Exercise per Session: Not on file  Stress:   . Feeling of Stress : Not on file  Social Connections:   . Frequency of Communication with Friends and Family: Not on file  . Frequency of Social Gatherings with Friends and Family: Not on file  . Attends Religious Services: Not on file  . Active Member of Clubs or Organizations: Not on file  . Attends Archivist Meetings: Not on file  . Marital Status: Not on file  Intimate Partner Violence:   .  Fear of  Current or Ex-Partner: Not on file  . Emotionally Abused: Not on file  . Physically Abused: Not on file  . Sexually Abused: Not on file   Current Outpatient Medications on File Prior to Visit  Medication Sig Dispense Refill  . ACCU-CHEK AVIVA PLUS test strip USE TO CHECK BLOOD SUGAR ONE TIME A DAY 100 strip 5  . Accu-Chek Softclix Lancets lancets USE TO CHECK BLOOD SUGAR 1 TIME DAILY 100 each 2  . AMBULATORY NON FORMULARY MEDICATION Medication Name: CPAP MASK OF CHOICE FOR HOME DEVICE 1 each 0  . Ascorbic Acid (VITAMIN C) 1000 MG tablet Take 1,000 mg by mouth daily.    Marland Kitchen aspirin 81 MG tablet Take 81 mg by mouth daily.    Marland Kitchen b complex vitamins tablet Take 1 tablet by mouth in the morning.    Marland Kitchen buPROPion (WELLBUTRIN XL) 150 MG 24 hr tablet TAKE 1 TABLET BY MOUTH EVERY DAY 90 tablet 2  . Caffeine-Magnesium Salicylate (DIUREX PO) Take 1 tablet by mouth daily as needed.     Marland Kitchen CALCIUM-MAGNESIUM-ZINC PO Take 3 capsules by mouth daily.    Marland Kitchen CINNAMON PO Take 1,200 mg by mouth daily.     . Coenzyme Q10 (CO Q 10) 100 MG CAPS Take 100 mg by mouth daily.     . fluticasone (FLONASE) 50 MCG/ACT nasal spray Place 2 sprays into both nostrils daily as needed for allergies.     Marland Kitchen glucosamine-chondroitin 500-400 MG tablet Take 2 tablets by mouth daily.     . Magnesium 500 MG TABS Take 500 mg by mouth at bedtime.     . Misc Natural Products (TART CHERRY ADVANCED PO) Take 1,200 mg by mouth at bedtime.     . Multiple Vitamin (MULTIVITAMIN) tablet Take 1 tablet by mouth daily.    . naloxone (NARCAN) 2 MG/2ML injection Inject 1 mL (1 mg total) into the muscle as needed for up to 2 doses (for opioid overdose). In case of emergency (overdose), inject into muscle of upper arm or leg and call 911. 2 mL 0  . nitroGLYCERIN (NITROSTAT) 0.4 MG SL tablet Place 1 tablet (0.4 mg total) under the tongue every 5 (five) minutes as needed for chest pain. Please keep upcoming appt in October. Thank you 25 tablet 1  .  NONFORMULARY OR COMPOUNDED ITEM 212.8 mcg by Epidural Infusion route. Medtronic Neuromodulation pump  Fentanyl 1,000.0 mcg/ml Baclofen 240.0 mcg/ml Bupivacaine 20.0 mg/ml Total dose 8.5 mcg/hr    . NONFORMULARY OR COMPOUNDED ITEM 88.77 mcg by Epidural Infusion route Continuous EPIDURAL. Medtronic Neuromodulation pump: Fentanyl, baclofen 88.77, bupivicane     . NONFORMULARY OR COMPOUNDED ITEM 5.918 mg by Epidural Infusion route Continuous EPIDURAL. Medtronic Neuromodulation pump: Fentanyl, Baclofen, Bupivicaine 5.918 mg     . Omega-3 Fatty Acids (FISH OIL) 1200 MG CAPS Take 1,200 mg by mouth in the morning.     Marland Kitchen OVER THE COUNTER MEDICATION Take 1 capsule by mouth daily. Schigandra Plus    . PARoxetine (PAXIL) 20 MG tablet TAKE 0.5 TABLET BY MOUTH DAILY IN THE MORNING 45 tablet 1  . polyethylene glycol (MIRALAX / GLYCOLAX) packet Take 17 g by mouth daily.     . pregabalin (LYRICA) 150 MG capsule Take 1 capsule (150 mg total) by mouth 3 (three) times daily. 90 capsule 2  . rosuvastatin (CRESTOR) 20 MG tablet TAKE 1 TABLET BY MOUTH EVERY DAY 90 tablet 3  . tiZANidine (ZANAFLEX) 4 MG tablet Take 1 tablet (4 mg total) by mouth  3 (three) times daily. 90 tablet 2  . Vitamin E 100 units TABS Take 100 Units by mouth daily.     Marland Kitchen oxyCODONE-acetaminophen (PERCOCET) 5-325 MG tablet Take 1 tablet by mouth every 8 (eight) hours as needed for severe pain. 90 tablet 0   No current facility-administered medications on file prior to visit.   No Known Allergies Family History  Problem Relation Age of Onset  . Lung cancer Mother   . Diabetes Father   . Heart disease Maternal Grandfather   . Diabetes Paternal Grandmother   . Colon cancer Paternal Grandfather   . Stroke Neg Hx     PE: BP (!) 150/100   Pulse 68   Ht 5\' 9"  (1.753 m)   Wt 232 lb 3.2 oz (105.3 kg)   LMP  (LMP Unknown)   SpO2 96%   BMI 34.29 kg/m  Wt Readings from Last 3 Encounters:  05/17/20 232 lb 3.2 oz (105.3 kg)  05/13/20 230 lb  (104.3 kg)  04/29/20 226 lb (102.5 kg)   Constitutional: overweight, in NAD Eyes: PERRLA, EOMI, no exophthalmos ENT: moist mucous membranes, no thyromegaly, no cervical lymphadenopathy Cardiovascular: RRR, No MRG Respiratory: CTA B Gastrointestinal: abdomen soft, NT, ND, BS+ Musculoskeletal: no deformities, strength intact in all 4 Skin: moist, warm, no rashes; no dark discoloration of skin Neurological: no tremor with outstretched hands, DTR normal in all 4  ASSESSMENT: 1.  Orthostatic hypotension  PLAN:  1.  Orthostatic hypotension -Per discussion with patient and review of the chart: She has a long history of chronic pain and she has been on multiple medications for this in the past including injectable steroids, narcotics (now also on epidural pump), Lyrica, baclofen. - at today's visit, we discussed that her orthostatic hypotension/presyncopal sensation may possibly be related to autonomic dysfunction in the setting of chronic diabetes, but I highly doubt this since her diabetes is very well controlled.  This could also have been caused by Lyrica, although she has been on this for a long time.  Alternatively, could be related to adrenal insufficiency.  Her particular case, it is possible to have developed adrenal insufficiency in the setting of prolonged steroid use in the past or prolonged opioid use. - we discussed that the diagnosis test for AI is a cosyntropin stimulation test.  For central adrenal insufficiency, an insulin tolerance test is the gold standard for diagnosis, but this is difficult to perform in clinic, and also dangerous.  For now, I suggested to start by checking a cosyntropin stimulation test.  I explained what the test consists of. We will plan to check this at 8 am >> will return for this. I advised her not to use any steroid products around the time of the test and also, if possible, to skip her opioids the night before and the day of the test. - if pt turns out to  have AI, we will need to check several tests to try to establish the etiology, possibly including a pituitary MRI - If the test is negative, she could be referred to the dysautonomia clinic at [facility], however, since she is not orthostatic anymore, this may be deferred to see if she develops repeat/further symptoms.  Orders Placed This Encounter  Procedures  . Cortisol  . ACTH  . Cortisol  . Cortisol   Component     Latest Ref Rng & Units 05/23/2020 05/23/2020 05/23/2020         8:30 AM  9:11 AM  9:42 AM  C206 ACTH     6 - 50 pg/mL 49    Cortisol, Plasma     ug/dL 15.1 24.1 25.0  No evidence of adrenal insufficiency.  Philemon Kingdom, MD PhD [facility]

## 2020-05-17 NOTE — Patient Instructions (Addendum)
Please come back for labs at 8-9 am, fasting. Please skip the Percocet in the morning before the labs and takes it afterwards.  We will schedule a new appt. If the labs are abnormal.

## 2020-05-17 NOTE — Patient Instructions (Addendum)
DUE TO COVID-19 ONLY ONE VISITOR IS ALLOWED TO COME WITH YOU AND STAY IN THE WAITING ROOM ONLY DURING PRE OP AND PROCEDURE DAY OF SURGERY. THE 1 VISITOR  MAY VISIT WITH YOU AFTER SURGERY IN YOUR PRIVATE ROOM DURING VISITING HOURS ONLY!  YOU NEED TO HAVE A COVID 19 TEST ON_11/19______ @_2 :15______, THIS TEST MUST BE DONE BEFORE SURGERY,  COVID TESTING SITE [address] [id number], IT IS ON THE RIGHT GOING OUT [address] APPROXIMATELY  2 MINUTES PAST [employer] ON THE RIGHT. ONCE YOUR COVID TEST IS COMPLETED,  PLEASE BEGIN THE QUARANTINE INSTRUCTIONS AS OUTLINED IN YOUR HANDOUT.                Yeva Bissette    Your procedure is scheduled on: 05/24/20   [facility]  En[facility]   Report to admitting at  7:15 AM     Call this number if you have problems the morning of surgery [place], NO CHEWING GUM [place].     No food after midnight.    You may have clear liquid until 6:30 AM.    At 6:30 AM drink pre surgery drink.   Nothing by mouth after 6:30 AM.                                                                     .                            Take these medicines the morning of surgery with A SIP OF WATER: Lyrica, Wellbutrin, Paxil   How to Manage Your Diabetes Before and After Surgery  Why is it important to control my blood sugar before and after surgery? . Improving blood sugar levels before and after surgery helps healing and can limit problems. . A way of improving blood sugar control is eating a healthy diet by: o  Eating less sugar and carbohydrates o  Increasing activity/exercise o  Talking with your doctor about reaching your blood sugar goals . High blood sugars (greater than 180 mg/dL) can raise your risk of infections and slow your recovery, so you will need to focus on controlling your diabetes during the weeks before  surgery. . Make sure that the doctor who takes care of your diabetes knows about your planned surgery including the date and location.  How do I manage my blood sugar before surgery? . Check your blood sugar at least 4 times a day, starting 2 days before surgery, to make sure that the level is not too high or low. o Check your blood sugar the morning of your surgery when you wake up and every 2 hours until you get to the Short Stay unit. . If your blood sugar is less than 70 mg/dL, you will need to treat for low blood sugar: o Do not take insulin. o Treat a low blood sugar (less than 70 mg/dL) with  cup of clear juice (cranberry or apple), 4 glucose tablets, OR glucose gel. o Recheck blood sugar in 15 minutes after treatment (to make sure it is  greater than 70 mg/dL). If your blood sugar is not greater than 70 mg/dL on recheck, call [phone] for further instructions. . Report your blood sugar to the short stay nurse when you get to Short Stay.  . If you are admitted to the hospital after surgery: o Your blood sugar will be checked by the staff and you will probably be given insulin after surgery (instead of oral diabetes medicines) to make sure you have good blood sugar levels. o The goal for blood sugar control after surgery is 80-180 mg/dL.   WHAT DO I DO ABOUT MY DIABETES MEDICATION?  Marland Kitchen Do not take oral diabetes medicines (pills) the morning of surgery.                                      You may not have any metal on your body including hair pins and              piercings  Do not wear jewelry, make-up, lotions, powders or perfumes, deodorant             Do not wear nail polish on your fingernails.  Do not shave  48 hours prior to surgery.                 Do not bring valuables to the hospital. [place].  Contacts, dentures or bridgework may not be worn into surgery.      Patients discharged the day of surgery will not  be allowed to drive home.   IF YOU ARE HAVING SURGERY AND GOING HOME THE SAME DAY, YOU MUST HAVE AN ADULT TO DRIVE YOU HOME AND BE WITH YOU FOR 24 HOURS.   YOU MAY GO HOME BY TAXI OR UBER OR ORTHERWISE, BUT AN ADULT MUST ACCOMPANY YOU HOME AND STAY WITH YOU FOR 24 HOURS.  Name and phone number of your driver:  Special Instructions: N/A              Please read over the following fact sheets you were given: _____________________________________________________________________  [facility] - Preparing for Surgery Before surgery, you can play an important role.   Because skin is not sterile, your skin needs to be as free of germs as possible.   You can reduce the number of germs on your skin by washing with CHG (chlorahexidine gluconate) soap before surgery .  CHG is an antiseptic cleaner which kills germs and bonds with the skin to continue killing germs even after washing. Please DO NOT use if you have an allergy to CHG or antibacterial soaps.   If your skin becomes reddened/irritated stop using the CHG and inform your nurse when you arrive at Short Stay. Do not shave (including legs and underarms) for at least 48 hours prior to the first CHG shower.  Please follow these instructions carefully:  1.  Shower with CHG Soap the night before surgery and the  morning of Surgery.  2.  If you choose to wash your hair, wash your hair first as usual with your  normal  shampoo.  3.  After you shampoo, rinse your hair and body thoroughly to remove the  shampoo.  4.  Use CHG as you would any other liquid soap.  You can apply chg directly  to the skin and wash                       Gently with a scrungie or clean washcloth.  5.  Apply the CHG Soap to your body ONLY FROM THE NECK DOWN.   Do not use on face/ open                           Wound or open sores. Avoid contact with eyes, ears mouth and genitals (private parts).                       Wash face,  Genitals  (private parts) with your normal soap.             6.  Wash thoroughly, paying special attention to the area where your surgery  will be performed.  7.  Thoroughly rinse your body with warm water from the neck down.  8.  DO NOT shower/wash with your normal soap after using and rinsing off  the CHG Soap.             9.  Pat yourself dry with a clean towel.            10.  Wear clean pajamas.            11.  Place clean sheets on your bed the night of your first shower and do not  sleep with pets. Day of Surgery : Do not apply any lotions/deodorants the morning of surgery.  Please wear clean clothes to the hospital/surgery center.  FAILURE TO FOLLOW THESE INSTRUCTIONS MAY RESULT IN THE CANCELLATION OF YOUR SURGERY PATIENT SIGNATURE_________________________________  NURSE SIGNATURE__________________________________  ___  Incentive Spirometer  An incentive spirometer is a tool that can help keep your lungs clear and active. This tool measures how well you are filling your lungs with each breath. Taking long deep breaths may help reverse or decrease the chance of developing breathing (pulmonary) problems (especially infection) following:  A long period of time when you are unable to move or be active. BEFORE THE PROCEDURE   If the spirometer includes an indicator to show your best effort, your nurse or respiratory therapist will set it to a desired goal.  If possible, sit up straight or lean slightly forward. Try not to slouch.  Hold the incentive spirometer in an upright position. INSTRUCTIONS FOR USE  1. Sit on the edge of your bed if possible, or sit up as far as you can in bed or on a chair. 2. Hold the incentive spirometer in an upright position. 3. Breathe out normally. 4. Place the mouthpiece in your mouth and seal your lips tightly around it. 5. Breathe in slowly and as deeply as possible, raising the piston or the ball toward the top of the column. 6. Hold your breath for 3-5  seconds or for as long as possible. Allow the piston or ball to fall to the bottom of the column. 7. Remove the mouthpiece from your mouth and breathe out normally. 8. Rest for a few seconds and repeat Steps 1 through 7 at least 10 times every 1-2 hours when you are awake. Take your time and take a few normal breaths between deep breaths. 9. The spirometer may include an indicator to show your best effort.  Use the indicator as a goal to work toward during each repetition. 10. After each set of 10 deep breaths, practice coughing to be sure your lungs are clear. If you have an incision (the cut made at the time of surgery), support your incision when coughing by placing a pillow or rolled up towels firmly against it. Once you are able to get out of bed, walk around indoors and cough well. You may stop using the incentive spirometer when instructed by your caregiver.  RISKS AND COMPLICATIONS  Take your time so you do not get dizzy or light-headed.  If you are in pain, you may need to take or ask for pain medication before doing incentive spirometry. It is harder to take a deep breath if you are having pain. AFTER USE  Rest and breathe slowly and easily.  It can be helpful to keep track of a log of your progress. Your caregiver can provide you with a simple table to help with this. If you are using the spirometer at home, follow these instructions: [place] IF:   You are having difficultly using the spirometer.  You have trouble using the spirometer as often as instructed.  Your pain medication is not giving enough relief while using the spirometer.  You develop fever of 100.5 F (38.1 C) or higher. SEEK IMMEDIATE MEDICAL CARE IF:   You cough up bloody sputum that had not been present before.  You develop fever of 102 F (38.9 C) or greater.  You develop worsening pain at or near the incision site. MAKE SURE YOU:   Understand these instructions.  Will watch your  condition.  Will get help right away if you are not doing well or get worse. Document Released: 10/29/2006 Document Revised: 09/10/2011 Document Reviewed: 12/30/2006 [facility] Patient Information 2014 [employer], [place].   ________________________________________________________________________ _____________________________________________________________________

## 2020-05-18 ENCOUNTER — Encounter (HOSPITAL_COMMUNITY)
Admission: RE | Admit: 2020-05-18 | Discharge: 2020-05-18 | Disposition: A | Discharge status: Home / Self Care | Payer: Medicare Other | Source: Ambulatory Visit | Attending: Orthopaedic Surgery | Admitting: Orthopaedic Surgery

## 2020-05-18 ENCOUNTER — Ambulatory Visit (HOSPITAL_COMMUNITY)
Admission: RE | Admit: 2020-05-18 | Discharge: 2020-05-18 | Disposition: A | Discharge status: Home / Self Care | Payer: Medicare Other | Source: Ambulatory Visit | Attending: Orthopaedic Surgery | Admitting: Orthopaedic Surgery

## 2020-05-18 ENCOUNTER — Other Ambulatory Visit: Payer: Self-pay

## 2020-05-18 ENCOUNTER — Encounter (HOSPITAL_COMMUNITY): Payer: Self-pay

## 2020-05-18 DIAGNOSIS — Z87891 Personal history of nicotine dependence: Secondary | ICD-10-CM | POA: Insufficient documentation

## 2020-05-18 DIAGNOSIS — E1122 Type 2 diabetes mellitus with diabetic chronic kidney disease: Secondary | ICD-10-CM | POA: Insufficient documentation

## 2020-05-18 DIAGNOSIS — G4730 Sleep apnea, unspecified: Secondary | ICD-10-CM | POA: Diagnosis not present

## 2020-05-18 DIAGNOSIS — N189 Chronic kidney disease, unspecified: Secondary | ICD-10-CM | POA: Insufficient documentation

## 2020-05-18 DIAGNOSIS — Z7982 Long term (current) use of aspirin: Secondary | ICD-10-CM | POA: Insufficient documentation

## 2020-05-18 DIAGNOSIS — M1711 Unilateral primary osteoarthritis, right knee: Secondary | ICD-10-CM | POA: Insufficient documentation

## 2020-05-18 DIAGNOSIS — I2510 Atherosclerotic heart disease of native coronary artery without angina pectoris: Secondary | ICD-10-CM | POA: Insufficient documentation

## 2020-05-18 DIAGNOSIS — Z79899 Other long term (current) drug therapy: Secondary | ICD-10-CM | POA: Insufficient documentation

## 2020-05-18 DIAGNOSIS — Z01818 Encounter for other preprocedural examination: Secondary | ICD-10-CM | POA: Diagnosis not present

## 2020-05-18 DIAGNOSIS — I129 Hypertensive chronic kidney disease with stage 1 through stage 4 chronic kidney disease, or unspecified chronic kidney disease: Secondary | ICD-10-CM | POA: Insufficient documentation

## 2020-05-18 HISTORY — DX: Angina pectoris, unspecified: I20.9

## 2020-05-18 LAB — URINALYSIS, ROUTINE W REFLEX MICROSCOPIC
Bilirubin Urine: NEGATIVE
Glucose, UA: NEGATIVE mg/dL
Hgb urine dipstick: NEGATIVE
Ketones, ur: NEGATIVE mg/dL
Leukocytes,Ua: NEGATIVE
Nitrite: NEGATIVE
Protein, ur: NEGATIVE mg/dL
Specific Gravity, Urine: 1.02 (ref 1.005–1.030)
pH: 6 (ref 5.0–8.0)

## 2020-05-18 LAB — HEMOGLOBIN A1C
Hgb A1c MFr Bld: 5.8 % — ABNORMAL HIGH (ref 4.8–5.6)
Mean Plasma Glucose: 119.76 mg/dL

## 2020-05-18 LAB — CBC WITH DIFFERENTIAL/PLATELET
Abs Immature Granulocytes: 0.01 10*3/uL (ref 0.00–0.07)
Basophils Absolute: 0.1 10*3/uL (ref 0.0–0.1)
Basophils Relative: 1 %
Eosinophils Absolute: 0.3 10*3/uL (ref 0.0–0.5)
Eosinophils Relative: 6 %
HCT: 39.6 % (ref 36.0–46.0)
Hemoglobin: 12.6 g/dL (ref 12.0–15.0)
Immature Granulocytes: 0 %
Lymphocytes Relative: 35 %
Lymphs Abs: 1.6 10*3/uL (ref 0.7–4.0)
MCH: 28.3 pg (ref 26.0–34.0)
MCHC: 31.8 g/dL (ref 30.0–36.0)
MCV: 89 fL (ref 80.0–100.0)
Monocytes Absolute: 0.6 10*3/uL (ref 0.1–1.0)
Monocytes Relative: 13 %
Neutro Abs: 2 10*3/uL (ref 1.7–7.7)
Neutrophils Relative %: 45 %
Platelets: 189 10*3/uL (ref 150–400)
RBC: 4.45 MIL/uL (ref 3.87–5.11)
RDW: 12.4 % (ref 11.5–15.5)
WBC: 4.5 10*3/uL (ref 4.0–10.5)
nRBC: 0 % (ref 0.0–0.2)

## 2020-05-18 LAB — BASIC METABOLIC PANEL
Anion gap: 4 — ABNORMAL LOW (ref 5–15)
BUN: 26 mg/dL — ABNORMAL HIGH (ref 8–23)
CO2: 34 mmol/L — ABNORMAL HIGH (ref 22–32)
Calcium: 9.3 mg/dL (ref 8.9–10.3)
Chloride: 102 mmol/L (ref 98–111)
Creatinine, Ser: 0.97 mg/dL (ref 0.44–1.00)
GFR, Estimated: >60 mL/min (ref >60)
Glucose, Bld: 77 mg/dL (ref 70–99)
Potassium: 4.6 mmol/L (ref 3.5–5.1)
Sodium: 140 mmol/L (ref 135–145)

## 2020-05-18 LAB — SURGICAL PCR SCREEN
MRSA, PCR: NEGATIVE
Staphylococcus aureus: POSITIVE — AB

## 2020-05-18 LAB — PROTIME-INR
INR: 1 (ref 0.8–1.2)
Prothrombin Time: 13.2 seconds (ref 11.4–15.2)

## 2020-05-18 LAB — APTT: aPTT: 34 seconds (ref 24–36)

## 2020-05-18 NOTE — Progress Notes (Signed)
COVID Vaccine Completed:Yes Date COVID Vaccine completed:10/13/19 COVID vaccine manufacturer: [place]     PCP - [name] NP Cardiologist - Dr/ M. 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 1-2 times a week.  Blood Thinner Instructions:NA Aspirin Instructions: Last Dose:  Anesthesia review:   Patient denies shortness of breath, fever, cough and chest pain at PAT appointment Yes   Patient verbalized understanding of instructions that were given to them at the PAT appointment. Patient was also instructed that they will need to review over the PAT instructions again at home before surgery. Yes Pt reports no SOB but she does has chronic fatigue and pain. She has a intrathecal pump in lower Rt abd. and takes lyrica and percocet BID. For pain and fibromyalgia.  Her Drs are aware that she is having surgery. She will bring the latest pump check sheet and instructions for Dr. Rhona Raider on DOS. The pump sheet from 04/12/20 is on the chart

## 2020-05-19 ENCOUNTER — Encounter: Payer: Self-pay | Admitting: Pain Medicine

## 2020-05-19 ENCOUNTER — Ambulatory Visit: Payer: Medicare Other | Attending: Pain Medicine | Admitting: Pain Medicine

## 2020-05-19 ENCOUNTER — Ambulatory Visit: Payer: Medicare Other | Admitting: Internal Medicine

## 2020-05-19 VITALS — BP 161/81 | HR 74 | Temp 98.0°F | Ht 69.0 in | Wt 228.0 lb

## 2020-05-19 DIAGNOSIS — F1120 Opioid dependence, uncomplicated: Secondary | ICD-10-CM

## 2020-05-19 DIAGNOSIS — M542 Cervicalgia: Secondary | ICD-10-CM

## 2020-05-19 DIAGNOSIS — Z978 Presence of other specified devices: Secondary | ICD-10-CM

## 2020-05-19 DIAGNOSIS — G894 Chronic pain syndrome: Secondary | ICD-10-CM

## 2020-05-19 DIAGNOSIS — Z9689 Presence of other specified functional implants: Secondary | ICD-10-CM | POA: Diagnosis not present

## 2020-05-19 DIAGNOSIS — M961 Postlaminectomy syndrome, not elsewhere classified: Secondary | ICD-10-CM

## 2020-05-19 DIAGNOSIS — Z969 Presence of functional implant, unspecified: Secondary | ICD-10-CM

## 2020-05-19 DIAGNOSIS — R937 Abnormal findings on diagnostic imaging of other parts of musculoskeletal system: Secondary | ICD-10-CM

## 2020-05-19 DIAGNOSIS — G8929 Other chronic pain: Secondary | ICD-10-CM

## 2020-05-19 DIAGNOSIS — Z79899 Other long term (current) drug therapy: Secondary | ICD-10-CM | POA: Diagnosis not present

## 2020-05-19 MED ORDER — LIDOCAINE HCL 2 % IJ SOLN
INTRAMUSCULAR | Status: AC
  Filled 2020-05-19: qty 20

## 2020-05-19 MED ORDER — OXYCODONE-ACETAMINOPHEN 5-325 MG PO TABS: 1.0000 | ORAL_TABLET | Freq: Three times a day (TID) | ORAL | 0 refills | Status: DC | PRN

## 2020-05-19 NOTE — Progress Notes (Signed)
Nursing Pain Medication Assessment:  Safety precautions to be maintained throughout the outpatient stay will include: orient to surroundings, keep bed in low position, maintain call bell within reach at all times, provide assistance with transfer out of bed and ambulation.  Medication Inspection Compliance: Pill count conducted under aseptic conditions, in front of the patient. Neither the pills nor the bottle was removed from the patient's sight at any time. Once count was completed pills were immediately returned to the patient in their original bottle.  Medication: Oxycodone/APAP Pill/Patch Count: 3 of 90 pills remain Pill/Patch Appearance: Markings consistent with prescribed medication Bottle Appearance: Standard pharmacy container. Clearly labeled. Filled Date: 10 / 36 / 21 Last Medication intake:  TodaySafety precautions to be maintained throughout the outpatient stay will include: orient to surroundings, keep bed in low position, maintain call bell within reach at all times, provide assistance with transfer out of bed and ambulation.

## 2020-05-19 NOTE — Progress Notes (Signed)
PROVIDER NOTE: Information contained herein reflects review and annotations entered in association with encounter. Interpretation of such information and data should be left to medically-trained personnel. Information provided to patient can be located elsewhere in the medical record under "Patient Instructions". Document created using STT-dictation technology, any transcriptional errors that may result from process are unintentional.    Patient: Melinda [name]  Service Category: Procedure  Provider: Gaspar Cola, MD  DOB: 12-21-1955  DOS: 05/19/2020  Location: [facility]  MRN: [id number]  Setting: Ambulatory - outpatient  Referring Provider: Jearld Fenton, NP  Type: Established Patient  Specialty: Interventional Pain Management  PCP: Jearld Fenton, NP   Primary Reason for Visit: Interventional Pain Management Treatment. CC: Back Pain  Procedure:          Intrathecal Drug Delivery System (IDDS):  Type: Reservoir Refill [phone]) 20% rate increase Region: Abdominal Laterality: Right  Type of Pump: Medtronic Synchromed II Delivery Route: Intrathecal Type of Pain Treated: Neuropathic/Nociceptive Primary Medication Class: Opioid/opiate  Medication, Concentration, Infusion Program, & Delivery Rate: Please see scanned programming printout.   Indications: 1. Chronic pain syndrome   2. Chronic low back pain (Bilateral) (L>R)   3. Failed back surgical syndrome   4. Presence of intrathecal pump (Medtronic intrathecal programmable pump) (40 mL pump)   5. Presence of functional implant (Medtronic programmable intrathecal pump) (Right abdominal area)   6. Pharmacologic therapy   7. Uncomplicated opioid dependence ([place])    Pain Assessment: Self-Reported Pain Score: 5 /10             Reported level is compatible with observation.        The patient comes into the clinic today with reaccumulation of fluid around the pump.  There is no redness, increased temperature, history  of fevers or night sweats, and therefore there is no signs or symptoms that would suggest infection.  The patient has requested that we again drain some fluid out of the pump, which we agreed to do.  We removed 235 mL of a yellowish, transparent, serous fluid with no evidence of recent bleeding.  This was done after having prepped the area with DuraPrep.  1 mL of 2% lidocaine was infiltrated using a 25-gauge, 1.5 inch needle.  An 18-gauge needle was then used to aspirate the fluid.  After all the fluid was removed, we again reprepped the area for the pump refill, once I have confirmed that there was no evidence of infection in the pocket.  The patient indicates that lately she has been experiencing a lot more pain in the lower back and in the area of the knee.  She attempted to go down on her oral medications but was unable to go below 2 pills/day.  In fact the pain has increased significantly and she has gone back to 3 tablets/day.  Today we will reprogram the pump to provide the patient with a 20% increase in the daily simple continuous infusion rate.  Today I have reviewed the medications in the pump and we can certainly consider the possibility of increasing the preservative-free fentanyl from 1000 mcg/mL to 2000 mcg/mL.  The patient indicates still having muscle spasms in the right side of the lower back when she moves in certain ways.  For this reason we will try to keep the baclofen in the pump.  However if necessary we may decrease the concentration.  In terms of the local anesthetic she has bupivacaine at 20 mg/mL, but we could also consider  increasing it up to 40 mg/mL, if needed.  For now, what we have in the pump is lasting at least 90 days and therefore we will continue with that and perhaps adjust the dose with some increase this until we get this pain under control.  However, the patient is scheduled to undergo right knee replacement next Tuesday.  The patient has already been provided with the  handout for the surgeon on how to manage the acute postoperative pain on our chronic pain patients.  She refers that she has already given this to the surgeon.  Pharmacotherapy Assessment  Analgesic: Oxycodone 5 mg, 1 tab PO q 8 hrs (15 mg/day of oxycodone) + Intrathecal PF-Fentanyl MME/day: 22.5 mg/day (oral).   Monitoring: [place] PMP: PDMP reviewed during this encounter.       Pharmacotherapy: No side-effects or adverse reactions reported. Compliance: No problems identified. Effectiveness: Clinically acceptable. Plan: Refer to "POC".  UDS:  Summary  Date Value Ref Range Status  07/24/2018 FINAL  Final    Comment:    ==================================================================== TOXASSURE SELECT 13 (MW) ==================================================================== Test                             Result       Flag       Units Drug Present and Declared for Prescription Verification   Oxycodone                      754          EXPECTED   ng/mg creat   Oxymorphone                    231          EXPECTED   ng/mg creat   Noroxycodone                   2463         EXPECTED   ng/mg creat   Noroxymorphone                 89           EXPECTED   ng/mg creat    Sources of oxycodone are scheduled prescription medications.    Oxymorphone, noroxycodone, and noroxymorphone are expected    metabolites of oxycodone. Oxymorphone is also available as a    scheduled prescription medication. Drug Present not Declared for Prescription Verification   Fentanyl                       4            UNEXPECTED ng/mg creat   Norfentanyl                    29           UNEXPECTED ng/mg creat    Source of fentanyl is a scheduled prescription medication,    including IV, patch, and transmucosal formulations. Norfentanyl    is an expected metabolite of fentanyl. ==================================================================== Test                      Result    Flag   Units      Ref Range    Creatinine              134  mg/dL      >=20 ==================================================================== Declared Medications:  The flagging and interpretation on this report are based on the  following declared medications.  Unexpected results may arise from  inaccuracies in the declared medications.  **Note: The testing scope of this panel includes these medications:  Oxycodone  **Note: The testing scope of this panel does not include following  reported medications:  Aspirin (Aspirin 81)  Bupropion (Wellbutrin)  Chondroitin (Glucosamine-Chondroitin)  Cinnamon  Glipizide (Glucotrol)  Glucosamine (Glucosamine-Chondroitin)  Magnesium  Multivitamin  Naloxone  Nitroglycerin (Nitrostat)  Paroxetine  Polyethylene Glycol (GlycoLAX)  Polyethylene Glycol (MiraLAX)  Pregabalin (Lyrica)  Rosuvastatin (Crestor)  Supplement (Omega-3)  Tizanidine (Zanaflex)  Turmeric  Ubiquinone (Coenzyme Q 10)  Vitamin D2 (Drisdol)  Vitamin E ==================================================================== For clinical consultation, please call [phone]. ====================================================================     Intrathecal Pump Therapy Assessment  Manufacturer: Medtronic Synchromed Type: Programmable Volume: 40 mL reservoir MRI compatibility: Yes   Drug content:  Primary Medication Class:Opioid Primary Medication:PF-Fentanyl(1054mcg/mL) Secondary Medication:PF-Bupivacaine(20mg /mL) Other Medication:PF-Baclofen (266mcg/mL)   Programming:  Type: Simple continuous. See pump readout for details.   Changes:  Medication Change: None at this point Rate Change: No change in rate  Reported side-effects or adverse reactions: None reported  Effectiveness: Described as relatively effective, allowing for increase in activities of daily living (ADL) Clinically meaningful improvement in function (CMIF): Sustained CMIF goals met  Plan: Pump  refill today  Pre-op H&P Assessment:  Ms. Putzier is a 64 y.o. (year old), female patient, seen today for interventional treatment. 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 Ms. Madewell has a current medication list which includes the following prescription(s): accu-chek aviva plus, accu-chek softclix lancets, AMBULATORY NON FORMULARY MEDICATION, vitamin c, aspirin, b complex vitamins, bupropion, caffeine-magnesium salicylate, calcium-magnesium-zinc, cinnamon, co q 10, fluticasone, glucosamine-chondroitin, magnesium, misc natural products, multivitamin, naloxone, nitroglycerin, NONFORMULARY OR COMPOUNDED ITEM, NONFORMULARY OR COMPOUNDED ITEM, NONFORMULARY OR COMPOUNDED ITEM, fish oil, OVER THE COUNTER MEDICATION, [START ON 07/11/2020] oxycodone-acetaminophen, paroxetine, polyethylene glycol, pregabalin, rosuvastatin, tizanidine, and vitamin e. Her primarily concern today is the Back Pain  Initial Vital Signs:  Pulse/HCG Rate: 74  Temp: 98 F (36.7 C) Resp:   BP: (!) 161/81 SpO2: 98 %  BMI: Estimated body mass index is 33.67 kg/m as calculated from the following:   Height as of this encounter: 5\' 9"  (1.753 m).   Weight as of this  encounter: 228 lb (103.4 kg).    Risk Assessment: Allergies: Reviewed. She has No Known Allergies.  Allergy Precautions: None required Coagulopathies: Reviewed. None identified.  Blood-thinner therapy: None at this time Active Infection(s): Reviewed. None identified. Ms. Netherland is afebrile  Site Confirmation: Ms. Hausmann was asked to confirm the procedure and laterality before marking the site Procedure checklist: Completed Consent: Before the procedure and under the influence of no sedative(s), amnesic(s), or anxiolytics, the patient was informed of the treatment options, risks and possible complications. To fulfill our ethical and legal obligations, as recommended by  the American Medical Association's Code of Ethics, I have informed the patient of my clinical impression; the nature and purpose of the treatment or procedure; the risks, benefits, and possible complications of the intervention; the alternatives, including doing nothing; the risk(s) and benefit(s) of the alternative treatment(s) or procedure(s); and the risk(s) and benefit(s) of doing nothing.  Ms. Salone was provided with information about the general risks and possible complications associated with most interventional procedures. These include, but are not limited to: failure to achieve desired goals, infection, bleeding, organ or nerve damage, allergic reactions, paralysis, and/or death.  In addition, she was informed of those risks and possible complications associated to this particular procedure, which include, but are not limited to: damage to the implant; failure to decrease pain; local, systemic, or serious CNS infections, intraspinal abscess with possible cord compression and paralysis, or life-threatening such as meningitis; bleeding; organ damage; nerve injury or damage with subsequent sensory, motor, and/or autonomic system dysfunction, resulting in transient or permanent pain, numbness, and/or weakness of one or  several areas of the body; allergic reactions, either minor or major life-threatening, such as anaphylactic or anaphylactoid reactions.  Furthermore, Ms. Charbonneau was informed of those risks and complications associated with the medications. These include, but are not limited to: allergic reactions (i.e.: anaphylactic or anaphylactoid reactions); endorphine suppression; bradycardia and/or hypotension; water retention and/or peripheral vascular relaxation leading to lower extremity edema and possible stasis ulcers; respiratory depression and/or shortness of breath; decreased metabolic rate leading to weight gain; swelling or edema; medication-induced neural toxicity; particulate matter embolism and blood vessel occlusion with resultant organ, and/or nervous system infarction; and/or intrathecal granuloma formation with possible spinal cord compression and permanent paralysis.  Before refilling the pump Ms. Matthies was informed that some of the medications used in the devise may not be FDA approved for such use and therefore it constitutes an off-label use of the medications.  Finally, she was informed that Medicine is not an exact science; therefore, there is also the possibility of unforeseen or unpredictable risks and/or possible complications that may result in a catastrophic outcome. The patient indicated having understood very clearly. We have given the patient no guarantees and we have made no promises. Enough time was given to the patient to ask questions, all of which were answered to the patient's satisfaction. Ms. Spong has indicated that she wanted to continue with the procedure. Attestation: I, the ordering provider, attest that I have discussed with the patient the benefits, risks, side-effects, alternatives, likelihood of achieving goals, and potential problems during recovery for the procedure that I have provided informed consent. Date  Time: 05/19/2020  1:42 PM  Pre-Procedure Preparation:   Monitoring: As per clinic protocol. Respiration, ETCO2, SpO2, BP, heart rate and rhythm monitor placed and checked for adequate function Safety Precautions: Patient was assessed for positional comfort and pressure points before starting the procedure. Time-out: I initiated and conducted the "Time-out" before starting the procedure, as per protocol. The patient was asked to participate by confirming the accuracy of the "Time Out" information. Verification of the correct person, site, and procedure were performed and confirmed by me, the nursing staff, and the patient. "Time-out" conducted as per Joint Commission's Universal Protocol (UP.01.01.01). Time: 1403  Description of Procedure:          Position: Supine Target Area: Central-port of intrathecal pump. Approach: Anterior, 90 degree angle approach. Area Prepped: Entire Area around the pump implant. DuraPrep (Iodine Povacrylex [0.7% available iodine] and Isopropyl Alcohol, 74% w/w) Safety  Precautions: Aspiration looking for blood return was conducted prior to all injections. At no point did we inject any substances, as a needle was being advanced. No attempts were made at seeking any paresthesias. Safe injection practices and needle disposal techniques used. Medications properly checked for expiration dates. SDV (single dose vial) medications used. Description of the Procedure: Protocol guidelines were followed. Two nurses trained to do implant refills were present during the entire procedure. The refill medication was checked by both healthcare providers as well as the patient. The patient was included in the "Time-out" to verify the medication. The patient was placed in position. The pump was identified. The area was prepped in the usual manner. The sterile template was positioned over the pump, making sure the side-port location matched that of the pump. Both, the pump and the template were held for stability. The needle provided in the Medtronic  Kit was then introduced thru the center of the template and into the central port. The pump content was aspirated and discarded volume documented. The new medication was slowly infused into the pump, thru the filter, making sure to avoid overpressure of the device. The needle was then removed and the area cleansed, making sure to leave some of the prepping solution back to take advantage of its long term bactericidal properties. The pump was interrogated and programmed to reflect the correct medication, volume, and dosage. The program was printed and taken to the physician for approval. Once checked and signed by the physician, a copy was provided to the patient and another scanned into the EMR. Vitals:   05/19/20 1334  BP: (!) 161/81  Pulse: 74  Temp: 98 F (36.7 C)  SpO2: 98%  Weight: 228 lb (103.4 kg)  Height: $Remove'5\' 9"'[id number]$  (1.753 m)    Start Time: 1424 hrs. End Time: 1448 hrs. Materials & Medications: Medtronic Refill Kit Medication(s): Please see chart orders for details.  Imaging Guidance:          Type of Imaging Technique: None used Indication(s): N/A Exposure Time: No patient exposure Contrast: None used. Fluoroscopic Guidance: N/A Ultrasound Guidance: N/A Interpretation: N/A  Antibiotic Prophylaxis:   Anti-infectives (From admission, onward)   None     Indication(s): None identified  Post-operative Assessment:  Post-procedure Vital Signs:  Pulse/HCG Rate: 74  Temp: 98 F (36.7 C) Resp:   BP: (!) 161/81 SpO2: 98 %  EBL: None  Complications: No immediate post-treatment complications observed by team, or reported by patient.  Note: The patient tolerated the entire procedure well. A repeat set of vitals were taken after the procedure and the patient was kept under observation following institutional policy, for this type of procedure. Post-procedural neurological assessment was performed, showing return to baseline, prior to discharge. The patient was provided with  post-procedure discharge instructions, including a section on how to identify potential problems. Should any problems arise concerning this procedure, the patient was given instructions to immediately contact us, at any time, without hesitation. In any case, we plan to contact the patient by telephone for a follow-up status report regarding this interventional procedure.  Comments:  No additional relevant information.  Plan of Care  Orders:  Orders Placed This Encounter  Procedures  . PUMP REFILL    Maintain Protocol by having two(2) healthcare providers during procedure and programming.    Scheduling Instructions:     Please refill intrathecal pump today.    Order Specific Question:   Where will this procedure be performed?    Answer:  [facility]  . PUMP REFILL    Whenever possible schedule on a procedure today.    Standing Status:   Future    Standing Expiration Date:   10/16/2020    Scheduling Instructions:     Please schedule intrathecal pump refill based on pump programming. Avoid schedule intervals of more than 120 days (4 months).    Order Specific Question:   Where will this procedure be performed?    Answer:   [facility]  . Informed Consent Details: Physician/Practitioner Attestation; Transcribe to consent form and obtain patient signature    Transcribe to consent form and obtain patient signature.    Order Specific Question:   Physician/Practitioner attestation of informed consent for procedure/surgical case    Answer:   I, the physician/practitioner, attest that I have discussed with the patient the benefits, risks, side effects, alternatives, likelihood of achieving goals and potential problems during recovery for the procedure that I have provided informed consent.    Order Specific Question:   Procedure    Answer:   Intrathecal pump refill    Order Specific Question:   Physician/Practitioner performing the procedure    Answer:   Attending Physician:  Kathlen Brunswick. Dossie Arbour, MD & designated trained staff    Order Specific Question:   Indication/Reason    Answer:   Chronic Pain Syndrome (G89.4), presence of an intrathecal pump (Z97.8)   Chronic Opioid Analgesic:  Oxycodone 5 mg, 1 tab PO q 8 hrs (15 mg/day of oxycodone) + Intrathecal PF-Fentanyl MME/day: 22.5 mg/day (oral).   Medications ordered for procedure: Meds ordered this encounter  Medications  . oxyCODONE-acetaminophen (PERCOCET) 5-325 MG tablet    Sig: Take 1 tablet by mouth every 8 (eight) hours as needed for severe pain.    Dispense:  90 tablet    Refill:  0    Chronic Pain: STOP Act (Not applicable) Fill 1 day early if closed on refill date. Avoid benzodiazepines within 8 hours of opioids   Medications administered: Melinda [name] had no medications administered during this visit.  See the medical record for exact dosing, route, and time of administration.  Follow-up plan:   Return for Pump Refill (Max:84mo).       Interventional treatment options:  Under consideration: Possible left lumbar facet RFA  Possible right lumbar facet RFA  Diagnostic caudal ESI + diagnostic epidurogram Possible Racz procedure Diagnostic bilateral IA knee injection (Steroid) Diagnostic bilateral genicular NB Possible bilateral genicular nerve RFA Possible bilateral Hyalgan knee injection DiagnosticcervicalESI Diagnostic bilateral cervical facet block Possible bilateral cervical facet RFA   Therapeutic/palliative (PRN): Palliative/therapeutic intrathecal pump management (refills/programming adjustments)  Palliative left L2-3 LESI #2 Palliative right lumbar facet block #4  Diagnostic left lumbar facet block #2      Recent Visits Date Type Provider Dept  04/12/20 Office Visit [name], [facility]  03/23/20 Office Visit Gillis Santa, MD [facility]  02/23/20 Procedure visit [name], MD [facility]  Showing recent  visits within past 90 days and meeting all other requirements Today's Visits Date Type Provider Dept  05/19/20 Procedure visit [name], MD [facility]  Showing today's visits and meeting all other requirements Future Appointments Date Type Provider Dept  07/18/20 Appointment [name], MD [facility]  Showing future appointments within next 90 days and meeting all other requirements  Disposition: Discharge home  Discharge (Date  Time): 05/19/2020; 1500 hrs.   Primary Care Physician: Jearld Fenton, NP Location:  [facility] Note by: Gaspar Cola, MD Date: 05/19/2020; Time: 3:15 PM  Disclaimer:  Medicine is not an Chief Strategy Officer. The only guarantee in medicine is that nothing is guaranteed. It is important to note that the decision to proceed with this intervention was based on the information collected from the patient. The Data and conclusions were drawn from the patient's questionnaire, the interview, and the physical examination. Because the information was provided in large part by the patient, it cannot be guaranteed that it has not been purposely or unconsciously manipulated. Every effort has been made to obtain as much relevant data as possible for this evaluation. It is important to note that the conclusions that lead to this procedure are derived in large part from the available data. Always take into account that the treatment will also be dependent on availability of resources and existing treatment guidelines, considered by other Pain Management Practitioners as being common knowledge and practice, at the time of the intervention. For Medico-Legal purposes, it is also important to point out that variation in procedural techniques and pharmacological choices are the acceptable norm. The indications, contraindications, technique, and results of the above procedure should only be interpreted and judged by a  Board-Certified Interventional Pain Specialist with extensive familiarity and expertise in the same exact procedure and technique.

## 2020-05-19 NOTE — Progress Notes (Addendum)
Anesthesia Chart Review   Case: [id number] Date/Time: 05/24/20 0931   Procedure: RIGHT TOTAL KNEE ARTHROPLASTY (Right Knee)   Anesthesia type: Spinal   Pre-op diagnosis: RIGHT KNEE DEGENERATIVE JOINT DISEASE   Location: [facility] ROOM 06 / [facility]   Surgeons: Melrose Nakayama, MD      DISCUSSION:64 y.o. former smoker (48 pack years, quit 07/22/07) with h/o DM II, sleep apnea w/cpap, CKD, CAD (stent to LAD 2009, 2012 cardiac cath with patent stents), h/o WPW s/p ablation, orthostatic HTN, right knee djd scheduled for above procedure 05/24/2020 with Dr. Melrose Nakayama.   Pt with intrathecal pump in place (Fentanyl 1000 mcg/mL, Bupivacain 20mg /mL, Baclofen 240 mcg/mL), last revised 03/11/2020. Managed by Dr. Dossie Arbour who is aware of upcoming procedure. Pt also prescribed Oxycodone 5 gm PO q 8 hrs prn.   H/o three level cervical fusion with hardware in place.   Pt last seen by cardiology 05/13/2020.  Per OV note, "She may hold her aspirin for 5 to 7 days prior to knee procedure if necessary.  She may proceed with low overall cardiac risk.  She is able to complete 4 METS of activity.  No anginal symptoms."  Discussed with Dr. Kalman Shan. Anticipate pt can proceed with planned procedure barring acute status change.   VS: BP (!) 162/87   Pulse 65   Temp 37.2 C (Oral)   Resp 18   Ht 5\' 9"  (1.753 m)   Wt 103 kg   LMP  (LMP Unknown)   SpO2 96%   BMI 33.52 kg/m   PROVIDERS: Jearld Fenton, NP is PCP   Candee Furbish, MD is Cardiologist  LABS: Labs reviewed: Acceptable for surgery. (all labs ordered are listed, but only abnormal results are displayed)  Labs Reviewed  SURGICAL PCR SCREEN - Abnormal; Notable for the following components:      Result Value   Staphylococcus aureus POSITIVE (*)    All other components within normal limits  HEMOGLOBIN A1C - Abnormal; Notable for the following components:   Hgb A1c MFr Bld 5.8 (*)    All other components within normal limits  BASIC METABOLIC PANEL - Abnormal;  Notable for the following components:   CO2 34 (*)    BUN 26 (*)    Anion gap 4 (*)    All other components within normal limits  URINALYSIS, ROUTINE W REFLEX MICROSCOPIC - Abnormal; Notable for the following components:   Color, Urine AMBER (*)    All other components within normal limits  APTT  CBC WITH DIFFERENTIAL/PLATELET  PROTIME-INR  TYPE AND SCREEN     IMAGES:   EKG: 02/11/20 Rate 97 bpm  Sinus rhythm   CV: Myocardial Perfusion 06/04/2019  Nuclear stress EF: 62%.  There was no ST segment deviation noted during stress.  The study is normal.  This is a low risk study.  The left ventricular ejection fraction is normal (55-65%).   Normal stress nuclear study with no ischemia or infarction.  Gated ejection fraction 62% with normal wall motion.  Echo 04/22/2019 IMPRESSIONS    1. Left ventricular ejection fraction, by visual estimation, is 60 to  65%. The left ventricle has normal function. Normal left ventricular size.  There is no left ventricular hypertrophy. Normal wall motion. Normal  diastolic function.  2. Global right ventricle has normal systolic function.The right  ventricular size is normal. No increase in right ventricular wall  thickness.  3. Left atrial size was normal.  4. Right atrial size was normal.  5.  The mitral valve is normal in structure. No evidence of mitral valve  regurgitation. No evidence of mitral stenosis.  6. The tricuspid valve is normal in structure. Tricuspid valve  regurgitation is trivial.  7. The aortic valve is tricuspid Aortic valve regurgitation was not  visualized by color flow Doppler. Structurally normal aortic valve, with  no evidence of sclerosis or stenosis.  8. The tricuspid regurgitant velocity is 2.59 m/s, and with an assumed  right atrial pressure of 8 mmHg, the estimated right ventricular systolic  pressure is mildly elevated at 34.8 mmHg.  9. The inferior vena cava is normal in size with greater  than 50%  respiratory variability, suggesting right atrial pressure of 3 mmHg. Past Medical History:  Diagnosis Date  . Amputation of great toe, right, traumatic ([place]) 05/30/2010  . Amputation of second toe, right, traumatic ([place]) 09/2017  . Anginal pain ([place])   . Anxiety   . Blue toes    2nd toe on right foot, will get appt.  . Bulging disc   . CAD (coronary artery disease) 2009   s/p stent to LAD  . Cardiac arrhythmia due to congenital heart disease 1992   WPW.  Ablations done.  Now has rare episodes  . Chicken pox   . Chronic fatigue   . Chronic kidney disease    "problem with kidney filtration"  . Coronary arteritis   . Degenerative disc disease    Back, neck, hands, knees  . Depression   . Diabetes mellitus without complication ([place])   . Facet joint disease   . Fibromyalgia   . Heart disease   . Hyperlipidemia   . IBS (irritable bowel syndrome)   . MCL deficiency, knee   . MRSA (methicillin resistant staph aureus) culture positive 2011   GREAT TOE RIGHT FOOT  . Neuropathy 04/18/2010  . Orthostatic hypotension 01/2020  . Peripheral neuropathy   . Renal insufficiency   . Restless leg syndrome   . Sepsis ([place]) 09/2013  . Sleep apnea 08/17/2003   uses CPAP, sleep [facility] (mild to moderate)  . Spinal stenosis     Past Surgical History:  Procedure Laterality Date  . ABLATION     UTERUS  . ABLATION     HEART  . AMPUTATION TOE Right 02/01/2017   Procedure: AMPUTATION TOE-RIGHT 2ND MPJ;  Surgeon: Albertine Patricia, DPM;  Location: [facility];  Service: Podiatry;  Laterality: Right;  . ANTERIOR CERVICAL DECOMP/DISCECTOMY FUSION N/A 07/28/2014   Procedure: ANTERIOR CERVICAL DECOMPRESSION/DISCECTOMY FUSION CERVICAL 3-4,4-5,5-6 LEVELS WITH INSTRUMENTATION AND ALLOGRAFT;  Surgeon: Sinclair Ship, MD;  Location: [place];  Service: Orthopedics;  Laterality: N/A;  Anterior cervical decompression fusion, cervical 3-4, cervical 4-5, cervical 5-6 with  instrumentation and allograft  . BACK SURGERY     X 3 1979, 1994, 1995  . CARPAL TUNNEL RELEASE Right   . CHOLECYSTECTOMY  2003  . CORONARY ANGIOPLASTY  2008  . EYE SURGERY Bilateral 2013   Eyelid lift   . FOOT SURGERY Right    BIG TOE  . FOOT SURGERY Bilateral    PLANTAR FASCIITIS  . FOOT SURGERY Right    2ND TOE  . GALLBLADDER SURGERY    . HAMMER TOE SURGERY Right 10/17/2017   Procedure: HAMMER TOE CORRECTION-4TH TOE;  Surgeon: Albertine Patricia, DPM;  Location: [place];  Service: Podiatry;  Laterality: Right;  LMA- WITH LOCAL Diabetic - diet controlled  . HAND SURGERY Left   . HAND SURGEY Left   .  HEART STENT  2009   LAD  . INFUSION PUMP IMPLANTATION     X2 with morphine and baclofen  . INTRATHECAL PUMP REVISION N/A 04/25/2018   Procedure: Intrathecal pump replacement;  Surgeon: Clydell Hakim, MD;  Location: [place];  Service: Neurosurgery;  Laterality: N/A;  right  . INTRATHECAL PUMP REVISION Right 04/25/2018  . INTRATHECAL PUMP REVISION N/A 12/12/2018   Procedure: Intrathecal pump revision with exploration of pocket;  Surgeon: Clydell Hakim, MD;  Location: [place];  Service: Neurosurgery;  Laterality: N/A;  Intrathecal pump revision with exploration of pocket  . INTRATHECAL PUMP REVISION N/A 03/11/2020   Procedure: INTRATHECAL PUMP REVISION;  Surgeon: [name], MD;  Location: [facility];  Service: Neurosurgery;  Laterality: N/A;  . IRRIGATION AND DEBRIDEMENT FOOT Right 02/23/2017   Procedure: IRRIGATION AND DEBRIDEMENT FOOT-right foot;  Surgeon: Samara Deist, DPM;  Location: [facility];  Service: Podiatry;  Laterality: Right;  . KNEE SURGERY Right 05/24/2020  . PAIN PUMP REVISION N/A 08/15/2018   Procedure: Intrathecal PUMP REVISION;  Surgeon: Clydell Hakim, MD;  Location: [place];  Service: Neurosurgery;  Laterality: N/A;  INTRATHECAL PUMP REVISION  . TOE SURGERY     then revision 8/18    MEDICATIONS: . ACCU-CHEK AVIVA PLUS test strip  . Accu-Chek Softclix  Lancets lancets  . AMBULATORY NON FORMULARY MEDICATION  . Ascorbic Acid (VITAMIN C) 1000 MG tablet  . aspirin 81 MG tablet  . b complex vitamins tablet  . buPROPion (WELLBUTRIN XL) 150 MG 24 hr tablet  . Caffeine-Magnesium Salicylate (DIUREX PO)  . CALCIUM-MAGNESIUM-ZINC PO  . CINNAMON PO  . Coenzyme Q10 (CO Q 10) 100 MG CAPS  . fluticasone (FLONASE) 50 MCG/ACT nasal spray  . glucosamine-chondroitin 500-400 MG tablet  . Magnesium 500 MG TABS  . Misc Natural Products (TART CHERRY ADVANCED PO)  . Multiple Vitamin (MULTIVITAMIN) tablet  . naloxone [facility]) 2 MG/2ML injection  . nitroGLYCERIN (NITROSTAT) 0.4 MG SL tablet  . NONFORMULARY OR COMPOUNDED ITEM  . NONFORMULARY OR COMPOUNDED ITEM  . NONFORMULARY OR COMPOUNDED ITEM  . Omega-3 Fatty Acids (FISH OIL) 1200 MG CAPS  . OVER THE COUNTER MEDICATION  . oxyCODONE-acetaminophen (PERCOCET) 5-325 MG tablet  . PARoxetine (PAXIL) 20 MG tablet  . polyethylene glycol (MIRALAX / GLYCOLAX) packet  . pregabalin (LYRICA) 150 MG capsule  . rosuvastatin (CRESTOR) 20 MG tablet  . tiZANidine (ZANAFLEX) 4 MG tablet  . Vitamin E 100 units TABS   No current facility-administered medications for this encounter.     Konrad Felix, PA-C [facility] [phone]

## 2020-05-20 ENCOUNTER — Telehealth: Payer: Self-pay

## 2020-05-20 ENCOUNTER — Other Ambulatory Visit (HOSPITAL_COMMUNITY)
Admission: RE | Admit: 2020-05-20 | Discharge: 2020-05-20 | Disposition: A | Discharge status: Home / Self Care | Payer: Medicare Other | Source: Ambulatory Visit | Attending: Orthopaedic Surgery | Admitting: Orthopaedic Surgery

## 2020-05-20 ENCOUNTER — Telehealth: Payer: Self-pay | Admitting: Internal Medicine

## 2020-05-20 DIAGNOSIS — Z01812 Encounter for preprocedural laboratory examination: Secondary | ICD-10-CM | POA: Insufficient documentation

## 2020-05-20 DIAGNOSIS — Z20822 Contact with and (suspected) exposure to covid-19: Secondary | ICD-10-CM | POA: Diagnosis not present

## 2020-05-20 LAB — SARS CORONAVIRUS 2 (TAT 6-24 HRS): SARS Coronavirus 2: NEGATIVE

## 2020-05-20 NOTE — Telephone Encounter (Signed)
Melinda [name] called checking on surgical clearance.  They have faxed over 2 times  Pt is having surgery 11/23  Please advise

## 2020-05-20 NOTE — Anesthesia Preprocedure Evaluation (Addendum)
Anesthesia Evaluation  Patient identified by MRN, date of birth, ID band Patient awake    Reviewed: Allergy & Precautions, NPO status , Patient's Chart, lab work & pertinent test results  Airway Mallampati: II  TM Distance: >3 FB Neck ROM: Full    Dental no notable dental hx. (+) Teeth Intact, Dental Advisory Given   Pulmonary sleep apnea and Continuous Positive Airway Pressure Ventilation , former smoker,    Pulmonary exam normal breath sounds clear to auscultation       Cardiovascular Exercise Tolerance: Good hypertension, + CAD  Normal cardiovascular exam+ dysrhythmias  Rhythm:Regular Rate:Normal  1021/21 Echo  1. Left ventricular ejection fraction, by visual estimation, is 60 to  65%. The left ventricle has normal function. Normal left ventricular size.  There is no left ventricular hypertrophy. Normal wall motion. Normal  diastolic function.    Neuro/Psych Anxiety Depression  Neuromuscular disease    GI/Hepatic Neg liver ROS,   Endo/Other  diabetes, Well Controlled, Type 2  Renal/GU Renal diseaseK+ 4.6 Cr 0.97     Musculoskeletal  (+) Arthritis , Fibromyalgia -  Abdominal (+) + obese,   Peds  Hematology Hgb 12.6 plt 189   Anesthesia Other Findings   Reproductive/Obstetrics                           Anesthesia Physical Anesthesia Plan  ASA: III  Anesthesia Plan: Spinal   Post-op Pain Management:  [facility] for Post-op pain   Induction:   PONV Risk Score and Plan: 3 and Treatment may vary due to age or medical condition, Ondansetron and Midazolam  Airway Management Planned: Nasal Cannula and Natural Airway  Additional Equipment: None  Intra-op Plan:   Post-operative Plan:   Informed Consent: I have reviewed the patients History and Physical, chart, labs and discussed the procedure including the risks, benefits and alternatives for the proposed anesthesia with the patient  or authorized representative who has indicated his/her understanding and acceptance.     Dental advisory given  Plan Discussed with: CRNA and Anesthesiologist  Anesthesia Plan Comments: (See PAT note 05/18/2020, Konrad Felix, PA-C  Spinal w R adductor canal)      Anesthesia Quick Evaluation

## 2020-05-20 NOTE — Telephone Encounter (Signed)
Post pump refill. Patient states she is doing good.

## 2020-05-20 NOTE — Telephone Encounter (Signed)
lmovm to let them know pt has not been seen for pre-op... Garnette Gunner out of the office and will be out on Monday... I spoke to Dr Darnell Level and he agreed to complete surgical clearance under the circumstances... pt has already been cleared from Card and Neurology---labs completed.Marland KitchenMarland Kitchen

## 2020-05-22 NOTE — Telephone Encounter (Signed)
Noted, appreciated Dr. Darnell Level for taking care of this in my absence.

## 2020-05-23 ENCOUNTER — Ambulatory Visit (INDEPENDENT_AMBULATORY_CARE_PROVIDER_SITE_OTHER): Payer: Medicare Other | Admitting: Family Medicine

## 2020-05-23 ENCOUNTER — Ambulatory Visit (INDEPENDENT_AMBULATORY_CARE_PROVIDER_SITE_OTHER): Payer: Medicare Other

## 2020-05-23 ENCOUNTER — Other Ambulatory Visit (INDEPENDENT_AMBULATORY_CARE_PROVIDER_SITE_OTHER): Payer: Medicare Other

## 2020-05-23 ENCOUNTER — Other Ambulatory Visit: Payer: Self-pay

## 2020-05-23 ENCOUNTER — Encounter: Payer: Self-pay | Admitting: Family Medicine

## 2020-05-23 VITALS — BP 160/80 | HR 76 | Temp 97.9°F | Ht 69.0 in | Wt 230.3 lb

## 2020-05-23 DIAGNOSIS — I6523 Occlusion and stenosis of bilateral carotid arteries: Secondary | ICD-10-CM | POA: Diagnosis not present

## 2020-05-23 DIAGNOSIS — Z79891 Long term (current) use of opiate analgesic: Secondary | ICD-10-CM

## 2020-05-23 DIAGNOSIS — G894 Chronic pain syndrome: Secondary | ICD-10-CM | POA: Diagnosis not present

## 2020-05-23 DIAGNOSIS — Z01818 Encounter for other preprocedural examination: Secondary | ICD-10-CM

## 2020-05-23 DIAGNOSIS — Z8614 Personal history of Methicillin resistant Staphylococcus aureus infection: Secondary | ICD-10-CM | POA: Diagnosis not present

## 2020-05-23 DIAGNOSIS — I158 Other secondary hypertension: Secondary | ICD-10-CM

## 2020-05-23 DIAGNOSIS — I2510 Atherosclerotic heart disease of native coronary artery without angina pectoris: Secondary | ICD-10-CM

## 2020-05-23 DIAGNOSIS — I2583 Coronary atherosclerosis due to lipid rich plaque: Secondary | ICD-10-CM | POA: Diagnosis not present

## 2020-05-23 DIAGNOSIS — E1141 Type 2 diabetes mellitus with diabetic mononeuropathy: Secondary | ICD-10-CM

## 2020-05-23 DIAGNOSIS — G4733 Obstructive sleep apnea (adult) (pediatric): Secondary | ICD-10-CM | POA: Diagnosis not present

## 2020-05-23 DIAGNOSIS — I951 Orthostatic hypotension: Secondary | ICD-10-CM

## 2020-05-23 DIAGNOSIS — N183 Chronic kidney disease, stage 3 unspecified: Secondary | ICD-10-CM

## 2020-05-23 LAB — CORTISOL
Cortisol, Plasma: 15.1 ug/dL
Cortisol, Plasma: 24.1 ug/dL
Cortisol, Plasma: 25 ug/dL

## 2020-05-23 MED ORDER — BUPIVACAINE LIPOSOME 1.3 % IJ SUSP
20.0000 mL | Freq: Once | INTRAMUSCULAR | Status: DC
  Filled 2020-05-23: qty 20

## 2020-05-23 MED ORDER — TRANEXAMIC ACID 1000 MG/10ML IV SOLN
2000.0000 mg | INTRAVENOUS | Status: DC
  Filled 2020-05-23: qty 20

## 2020-05-23 MED ORDER — COSYNTROPIN 0.25 MG IJ SOLR
0.2500 mg | Freq: Once | INTRAMUSCULAR | Status: AC
  Administered 2020-05-23: 0.25 mg via INTRAVENOUS

## 2020-05-23 NOTE — Assessment & Plan Note (Signed)
RCRI = 1  Has already received cardiac clearance.  Recent labs and imaging reviewed.  Anticipate adequately low risk to proceed with upcoming knee replacement.

## 2020-05-23 NOTE — Progress Notes (Signed)
Called patient to inform her of time change for surgery on 05/24/20. She is to arrive at 0630 for 0900 surgery. To complete her G2 drink at 0600. She verbalizes understanding.

## 2020-05-23 NOTE — Patient Instructions (Addendum)
Orthostatic vital signs today.  We will forward today's information to Dr [facility] office.  I hope for a speedy recovery to your upcoming surgery!

## 2020-05-23 NOTE — Assessment & Plan Note (Signed)
H/o toe infection s/p amputation.

## 2020-05-23 NOTE — Assessment & Plan Note (Signed)
Latest GFR actually >60.

## 2020-05-23 NOTE — Progress Notes (Signed)
Patient ID: Melinda [name], female    DOB: 27-Jan-1956, 64 y.o.   MRN: [id number]  This visit was conducted in person.  BP (!) 160/80 (BP Location: Right Arm, Patient Position: Sitting, Cuff Size: Large)   Pulse 76   Temp 97.9 F (36.6 C) (Temporal)   Ht 5\' 9"  (1.753 m)   Wt 230 lb 5 oz (104.5 kg)   LMP  (LMP Unknown)   SpO2 97%   BMI 34.01 kg/m   BP Readings from Last 3 Encounters:  05/23/20 (!) 160/80  05/19/20 (!) 161/81  05/18/20 (!) 162/87   Orthostatic VS for the past 24 hrs (Last 3 readings):  BP- Lying BP- Standing at 0 minutes  05/23/20 1243 -- 160/84  05/23/20 1240 160/82 --   CC: preop eval  Subjective:   HPI: Melinda [name] is a 64 y.o. female presenting on 05/23/2020 for Pre-op Exam (Scheduled for TKR tomorrow. )   New patient to me today with h/o fibromyalgia, chronic back pain, osteoarthritis, controlled T2DM managed by endocrinology, CAD s/p stent (2012), carotid stenosis, stage 3 CKD and peripheral neuropathy. She has h/o WPW s/p ablation. She has history of toe ulcer and R 1st/2nd toe amputations (MRSA infection, hammer toe infection).   Upcoming R total knee replacement by [facility] scheduled for tomorrow. Planned under spinal anesthesia. We received preop clearance request on Friday. PCP unavailable today.   She has recently seen neurology for imbalance - thought multifactorial including sensory ataxia, sensorimotor neuropathy and chronic pain. 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   Chronic pain followed by pain management [name] on oxycodone #90/month.  She has Medtronic intrathecal pump (fentanyl) in place.  She saw pain clinic on 05/19/2020 with discussion of pain management plan postop through ortho.   She has obstructive sleep apnea on nightly CPAP with good compliance.   Recent h/o orthostatic hypotension on midodrine short term, not currently. More recently over the past few weeks her blood pressures have been  elevated. Endorses normally BP runs 120/80, recent home BP reading last week down to 98/60. She is not on antihypertensive at this time. She saw endocrinology and is undergoing evaluation for adrenal insufficiency through Dr Cruzita Lederer with labs done this morning - results pending.  She saw cardiology on 05/13/2020 and has received cardiac clearance, recommendation was to hold aspirin for 5-7 days prior to knee procedure if necessary. Continues aspirin and statin.   Roxane attributes increased blood pressure this week due to stress over undergoing surgery. Denies hypertensive symptoms including HA, vision changes, CP/tightness, dyspnea.   No post op nausea/vomiting.  No trouble waking up from surgery.  Has had multiple surgical procedures which she has tolerated.      Relevant past medical, surgical, family and social history reviewed and updated as indicated. Interim medical history since our last visit reviewed. Allergies and medications reviewed and updated. Outpatient Medications Prior to Visit  Medication Sig Dispense Refill  . ACCU-CHEK AVIVA PLUS test strip USE TO CHECK BLOOD SUGAR ONE TIME A DAY 100 strip 5  . Accu-Chek Softclix Lancets lancets USE TO CHECK BLOOD SUGAR 1 TIME DAILY 100 each 2  . AMBULATORY NON FORMULARY MEDICATION Medication Name: CPAP MASK OF CHOICE FOR HOME DEVICE 1 each 0  . Ascorbic Acid (VITAMIN C) 1000 MG tablet Take 1,000 mg by mouth daily.    Marland Kitchen aspirin 81 MG tablet Take 81 mg by mouth daily.    Marland Kitchen b complex vitamins tablet Take 1 tablet by mouth  in the morning.    Marland Kitchen buPROPion (WELLBUTRIN XL) 150 MG 24 hr tablet TAKE 1 TABLET BY MOUTH EVERY DAY 90 tablet 2  . Caffeine-Magnesium Salicylate (DIUREX PO) Take 1 tablet by mouth daily as needed.     Marland Kitchen CALCIUM-MAGNESIUM-ZINC PO Take 3 capsules by mouth daily.    Marland Kitchen CINNAMON PO Take 1,200 mg by mouth daily.     . Coenzyme Q10 (CO Q 10) 100 MG CAPS Take 100 mg by mouth daily.     . fluticasone (FLONASE) 50 MCG/ACT nasal  spray Place 2 sprays into both nostrils daily as needed for allergies.     Marland Kitchen glucosamine-chondroitin 500-400 MG tablet Take 2 tablets by mouth daily.     . Magnesium 500 MG TABS Take 500 mg by mouth at bedtime.     . Misc Natural Products (TART CHERRY ADVANCED PO) Take 1,200 mg by mouth at bedtime.     . Multiple Vitamin (MULTIVITAMIN) tablet Take 1 tablet by mouth daily.    . naloxone (NARCAN) 2 MG/2ML injection Inject 1 mL (1 mg total) into the muscle as needed for up to 2 doses (for opioid overdose). In case of emergency (overdose), inject into muscle of upper arm or leg and call 911. 2 mL 0  . nitroGLYCERIN (NITROSTAT) 0.4 MG SL tablet Place 1 tablet (0.4 mg total) under the tongue every 5 (five) minutes as needed for chest pain. Please keep upcoming appt in October. Thank you 25 tablet 1  . NONFORMULARY OR COMPOUNDED ITEM 212.8 mcg by Epidural Infusion route. Medtronic Neuromodulation pump  Fentanyl 1,000.0 mcg/ml Baclofen 240.0 mcg/ml Bupivacaine 20.0 mg/ml Total daily dose 269.6 mcg/day    . NONFORMULARY OR COMPOUNDED ITEM 88.77 mcg by Epidural Infusion route Continuous EPIDURAL. Medtronic Neuromodulation pump: Fentanyl, baclofen 88.77, bupivicane     . NONFORMULARY OR COMPOUNDED ITEM 5.918 mg by Epidural Infusion route Continuous EPIDURAL. Medtronic Neuromodulation pump: Fentanyl, Baclofen, Bupivicaine 5.918 mg     . Omega-3 Fatty Acids (FISH OIL) 1200 MG CAPS Take 1,200 mg by mouth in the morning.     Marland Kitchen OVER THE COUNTER MEDICATION Take 1 capsule by mouth daily. Schigandra Plus    . [START ON 07/11/2020] oxyCODONE-acetaminophen (PERCOCET) 5-325 MG tablet Take 1 tablet by mouth every 8 (eight) hours as needed for severe pain. 90 tablet 0  . PARoxetine (PAXIL) 20 MG tablet TAKE 0.5 TABLET BY MOUTH DAILY IN THE MORNING 45 tablet 1  . polyethylene glycol (MIRALAX / GLYCOLAX) packet Take 17 g by mouth daily.     . pregabalin (LYRICA) 150 MG capsule Take 1 capsule (150 mg total) by mouth 3  (three) times daily. 90 capsule 2  . rosuvastatin (CRESTOR) 20 MG tablet TAKE 1 TABLET BY MOUTH EVERY DAY 90 tablet 3  . tiZANidine (ZANAFLEX) 4 MG tablet Take 1 tablet (4 mg total) by mouth 3 (three) times daily. 90 tablet 2  . Vitamin E 100 units TABS Take 100 Units by mouth daily.      Facility-Administered Medications Prior to Visit  Medication Dose Route Frequency Provider Last Rate Last Admin  . [START ON 05/24/2020] bupivacaine liposome (EXPAREL) 1.3 % injection 266 mg  20 mL Other Once Melrose Nakayama, MD      . Derrill Memo ON 05/24/2020] tranexamic acid (CYKLOKAPRON) 2,000 mg in sodium chloride 0.9 % 50 mL Topical Application  4,536 mg Topical To OR Melrose Nakayama, MD         Per HPI unless specifically indicated in ROS section  below Review of Systems Objective:  BP (!) 160/80 (BP Location: Right Arm, Patient Position: Sitting, Cuff Size: Large)   Pulse 76   Temp 97.9 F (36.6 C) (Temporal)   Ht 5\' 9"  (1.753 m)   Wt 230 lb 5 oz (104.5 kg)   LMP  (LMP Unknown)   SpO2 97%   BMI 34.01 kg/m   Wt Readings from Last 3 Encounters:  05/23/20 230 lb 5 oz (104.5 kg)  05/19/20 228 lb (103.4 kg)  05/18/20 227 lb (103 kg)      Physical Exam Vitals and nursing note reviewed.  Constitutional:      Appearance: Normal appearance. She is not ill-appearing.  Cardiovascular:     Rate and Rhythm: Normal rate and regular rhythm.     Pulses: Normal pulses.     Heart sounds: Normal heart sounds. No murmur heard.   Pulmonary:     Effort: Pulmonary effort is normal. No respiratory distress.     Breath sounds: Normal breath sounds. No wheezing, rhonchi or rales.  Abdominal:     General: Bowel sounds are normal. There is no distension.     Palpations: Abdomen is soft.     Tenderness: There is no abdominal tenderness. There is no guarding or rebound.     Hernia: No hernia is present.     Comments: Intrathecal pump present to RLQ abdomen  Musculoskeletal:     Right lower leg: Edema (tr)  present.     Left lower leg: Edema (tr) present.  Skin:    General: Skin is warm and dry.     Findings: No rash.  Neurological:     Mental Status: She is alert.  Psychiatric:        Mood and Affect: Mood normal.        Behavior: Behavior normal.       Lab Results  Component Value Date   HGBA1C 5.8 (H) 05/18/2020    Lab Results  Component Value Date   CREATININE 0.97 05/18/2020   BUN 26 (H) 05/18/2020   NA 140 05/18/2020   K 4.6 05/18/2020   CL 102 05/18/2020   CO2 34 (H) 05/18/2020    Lab Results  Component Value Date   WBC 4.5 05/18/2020   HGB 12.6 05/18/2020   HCT 39.6 05/18/2020   MCV 89.0 05/18/2020   PLT 189 05/18/2020    Lab Results  Component Value Date   CHOL 102 03/12/2019   HDL 34.30 (L) 03/12/2019   LDLCALC 36 03/12/2019   LDLDIRECT 63.0 11/04/2017   TRIG 158.0 (H) 03/12/2019   CHOLHDL 3 03/12/2019   Lab Results  Component Value Date   INR 1.0 05/18/2020   INR 1.12 09/11/2015   INR 1.12 07/21/2014   DG Chest 2 View CLINICAL DATA:  Preop right knee replacement  EXAM: CHEST - 2 VIEW  COMPARISON:  09/10/2015  FINDINGS: The heart size and mediastinal contours are within normal limits. Both lungs are clear. ACDF cervical spine. No acute skeletal abnormality.  IMPRESSION: No active cardiopulmonary disease.  Electronically Signed   By: Franchot Gallo M.D.   On: 05/19/2020 15:05   EKG 01/2020 - NSR rate 90s, normal axis, intervals, no acute ST/T changes Assessment & Plan:  This visit occurred during the SARS-CoV-2 public health emergency.  Safety protocols were in place, including screening questions prior to the visit, additional usage of staff PPE, and extensive cleaning of exam room while observing appropriate contact time as indicated for disinfecting solutions.  Problem List Items Addressed This Visit    Type 2 diabetes mellitus with diabetic mononeuropathy, without long-term current use of insulin ([place])    Diet controlled, managed by  PCP.       Pre-op evaluation - Primary    RCRI = 1  Has already received cardiac clearance.  Recent labs and imaging reviewed.  Anticipate adequately low risk to proceed with upcoming knee replacement.       Other secondary hypertension    Recently elevated readings - anticipate pain and stress of upcoming surgery contributing.  She states recent home BP readings have been normal (120/80). She will continue monitoring at home.  In h/o recent orthostasis, don't recommend adding antihypertensive agent at this time. She just had hormone labs through endocrinology this morning for evaluation of adrenal insufficiency, results pending - doubt this is contributory at this time.       OSA (obstructive sleep apnea)    Compliant with auto CPAP - continue perioperatively.      Long term current use of opiate analgesic (Chronic)   History of methicillin resistant staphylococcus aureus (MRSA)    H/o toe infection s/p amputation.       CKD (chronic kidney disease) stage 3, GFR 30-59 ml/min    Latest GFR actually >60.       Chronic pain syndrome (Chronic)    Has plan for pain management post op through ortho.       CAD (coronary artery disease) (Chronic)    Has received cards clearance          No orders of the defined types were placed in this encounter.  No orders of the defined types were placed in this encounter.   Patient Instructions  Orthostatic vital signs today.  We will forward today's information to Dr [facility] office.  I hope for a speedy recovery to your upcoming surgery!   Follow up plan: Return if symptoms worsen or fail to improve.  Ria Bush, MD

## 2020-05-23 NOTE — Assessment & Plan Note (Signed)
Diet controlled, managed by PCP.

## 2020-05-23 NOTE — Assessment & Plan Note (Addendum)
Recently elevated readings - anticipate pain and stress of upcoming surgery contributing.  She states recent home BP readings have been normal (120/80). She will continue monitoring at home.  In h/o recent orthostasis, don't recommend adding antihypertensive agent at this time. She just had hormone labs through endocrinology this morning for evaluation of adrenal insufficiency, results pending - doubt this is contributory at this time.

## 2020-05-23 NOTE — H&P (Signed)
TOTAL KNEE ADMISSION H&P  Patient is being admitted for right total knee arthroplasty.  Subjective:  Chief Complaint:right knee pain.  HPI: Melinda [name], 64 y.o. female, has a history of pain and functional disability in the right knee due to arthritis and has failed non-surgical conservative treatments for greater than 12 weeks to includeNSAID's and/or analgesics, corticosteriod injections, viscosupplementation injections, flexibility and strengthening excercises, supervised PT with diminished ADL's post treatment, use of assistive devices, weight reduction as appropriate and activity modification.  Onset of symptoms was gradual, starting 5 years ago with gradually worsening course since that time. The patient noted prior procedures on the knee to include  arthroscopy on the right knee(s).  Patient currently rates pain in the right knee(s) at 10 out of 10 with activity. Patient has night pain, worsening of pain with activity and weight bearing, pain that interferes with activities of daily living, crepitus and joint swelling.  Patient has evidence of subchondral cysts, subchondral sclerosis, periarticular osteophytes and joint space narrowing by imaging studies. There is no active infection.  Patient Active Problem List   Diagnosis Date Noted  . Amputation of toe of right foot ([place]) 04/29/2020  . Pain and numbness of upper extremity (Bilateral) 04/12/2020  . Cervicalgia 04/12/2020  . History of carpal tunnel surgery (Bilateral) 04/12/2020  . Malposition of intrathecal infusion catheter 03/11/2020  . Malfunction of intrathecal infusion pump 03/09/2020  . Thoracic facet hypertrophy/arthropathy (Multilevel) (Bilateral) 12/09/2019  . Thoracic facet syndrome (Multilevel) (Bilateral) 12/09/2019  . Encounter for interrogation of infusion pump 12/09/2019  . Sensation disturbance of skin 07/08/2019  . Allodynia 07/08/2019  . Hyperalgesia 07/08/2019  . Chronic low back pain (Bilateral) w/o sciatica  06/23/2019  . DDD (degenerative disc disease), lumbosacral 06/23/2019  . Diabetes 1.5, managed as type 2 ([place]) 05/12/2019  . Spondylosis without myelopathy or radiculopathy, lumbosacral region 11/05/2018  . CKD (chronic kidney disease) stage 3, GFR 30-59 ml/min 09/04/2018  . 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 08/06/2018  . DDD (degenerative disc disease), lumbar 05/15/2018  . Abnormal MRI, lumbar spine (11/18/2019) 05/06/2018  . Lumbar facet hypertrophy (Multilevel) 05/06/2018  . Lumbar foraminal stenosis 05/06/2018  . Lumbar lateral recess stenosis 05/06/2018  . Lumbar spinal stenosis w/ neurogenic claudication 04/09/2018  . Degenerative arthritis of knee (Bilateral) 03/12/2018  . Lumbar spondylosis 01/23/2018  . Hip pain, acute, left 01/01/2018  . Other secondary hypertension 08/09/2017  . OSA (obstructive sleep apnea) 08/09/2017  . Osteoarthritis of knee (Bilateral) (R>L) 06/08/2016  . Long term current use of opiate analgesic 04/21/2016  . Long term prescription opiate use 04/21/2016  . Opiate use 04/21/2016  . Encounter for adjustment or management of infusion pump 04/21/2016  . Osteoarthritis, multiple sites 04/21/2016  . Chronic musculoskeletal pain 04/21/2016  . Mixed anxiety and depressive disorder 09/11/2015  . Opioid-induced constipation (OIC) 07/27/2015  . Chronic knee pain (Primary Area of Pain) (Right) 07/27/2015  . Osteoarthritis of knee (Left) 07/27/2015  . 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 Chronic neck pain 05/24/2015  . Chronic low back pain (Bilateral) (L>R) w/ sciatica (Right) 05/24/2015  . Epidural fibrosis 05/24/2015  . Cervical spondylosis 05/24/2015  . Neuropathic pain 05/24/2015  . Lumbar facet syndrome 05/24/2015  . Fibromyalgia 05/24/2015  . Presence of functional implant (Medtronic programmable intrathecal pump) (Right abdominal area) 05/24/2015  .  Presence of intrathecal pump (Medtronic intrathecal programmable pump) (40 mL pump) 05/24/2015  . Type 2 diabetes  mellitus with diabetic mononeuropathy, without long-term current use of insulin ([place]) 04/27/2014  . Pure hypercholesterolemia 03/02/2014  . CAD (coronary artery disease) 10/04/2013  . Atherosclerotic heart disease of native coronary artery without angina pectoris 10/04/2013  . Chronic pain syndrome 04/18/2010   Past Medical History:  Diagnosis Date  . Amputation of great toe, right, traumatic ([place]) 05/30/2010  . Amputation of second toe, right, traumatic ([place]) 09/2017  . Anginal pain ([place])   . Anxiety   . Blue toes    2nd toe on right foot, will get appt.  . Bulging disc   . CAD (coronary artery disease) 2009   s/p stent to LAD  . Cardiac arrhythmia due to congenital heart disease 1992   WPW.  Ablations done.  Now has rare episodes  . Chicken pox   . Chronic fatigue   . Chronic kidney disease    "problem with kidney filtration"  . Coronary arteritis   . Degenerative disc disease    Back, neck, hands, knees  . Depression   . Diabetes mellitus without complication ([place])   . Facet joint disease   . Fibromyalgia   . Heart disease   . Hyperlipidemia   . IBS (irritable bowel syndrome)   . MCL deficiency, knee   . MRSA (methicillin resistant staph aureus) culture positive 2011   GREAT TOE RIGHT FOOT  . Neuropathy 04/18/2010  . Orthostatic hypotension 01/2020  . Peripheral neuropathy   . Renal insufficiency   . Restless leg syndrome   . Sepsis ([place]) 09/2013  . Sleep apnea 08/17/2003   uses CPAP, sleep study at [facility] (mild to moderate)  . Spinal stenosis     Past Surgical History:  Procedure Laterality Date  . ABLATION     UTERUS  . ABLATION     HEART  . AMPUTATION TOE Right 02/01/2017   Procedure: AMPUTATION TOE-RIGHT 2ND MPJ;  Surgeon: Albertine Patricia, DPM;  Location: [facility];  Service: Podiatry;  Laterality: Right;  . ANTERIOR CERVICAL  DECOMP/DISCECTOMY FUSION N/A 07/28/2014   Procedure: ANTERIOR CERVICAL DECOMPRESSION/DISCECTOMY FUSION CERVICAL 3-4,4-5,5-6 LEVELS WITH INSTRUMENTATION AND ALLOGRAFT;  Surgeon: Sinclair Ship, MD;  Location: [place];  Service: Orthopedics;  Laterality: N/A;  Anterior cervical decompression fusion, cervical 3-4, cervical 4-5, cervical 5-6 with instrumentation and allograft  . BACK SURGERY     X 3 1979, 1994, 1995  . CARPAL TUNNEL RELEASE Right   . CHOLECYSTECTOMY  2003  . CORONARY ANGIOPLASTY  2008  . EYE SURGERY Bilateral 2013   Eyelid lift   . FOOT SURGERY Right    BIG TOE  . FOOT SURGERY Bilateral    PLANTAR FASCIITIS  . FOOT SURGERY Right    2ND TOE  . GALLBLADDER SURGERY    . HAMMER TOE SURGERY Right 10/17/2017   Procedure: HAMMER TOE CORRECTION-4TH TOE;  Surgeon: Albertine Patricia, DPM;  Location: [place];  Service: Podiatry;  Laterality: Right;  LMA- WITH LOCAL Diabetic - diet controlled  . HAND SURGERY Left   . HAND SURGEY Left   . HEART STENT  2009   LAD  . INFUSION PUMP IMPLANTATION     X2 with morphine and baclofen  . INTRATHECAL PUMP REVISION N/A 04/25/2018   Procedure: Intrathecal pump replacement;  Surgeon: Clydell Hakim, MD;  Location: [place];  Service: Neurosurgery;  Laterality: N/A;  right  . INTRATHECAL PUMP REVISION Right 04/25/2018  . INTRATHECAL PUMP REVISION N/A 12/12/2018   Procedure: Intrathecal pump revision with exploration of pocket;  Surgeon: Clydell Hakim, MD;  Location: [place];  Service: Neurosurgery;  Laterality: N/A;  Intrathecal pump revision with exploration of pocket  . INTRATHECAL PUMP REVISION N/A 03/11/2020   Procedure: INTRATHECAL PUMP REVISION;  Surgeon: [name], MD;  Location: [facility];  Service: Neurosurgery;  Laterality: N/A;  . IRRIGATION AND DEBRIDEMENT FOOT Right 02/23/2017   Procedure: IRRIGATION AND DEBRIDEMENT FOOT-right foot;  Surgeon: Samara Deist, DPM;  Location: [facility];  Service: Podiatry;  Laterality: Right;   . KNEE SURGERY Right 05/24/2020  . PAIN PUMP REVISION N/A 08/15/2018   Procedure: Intrathecal PUMP REVISION;  Surgeon: Clydell Hakim, MD;  Location: [place];  Service: Neurosurgery;  Laterality: N/A;  INTRATHECAL PUMP REVISION  . TOE SURGERY     then revision 8/18    Current Facility-Administered Medications  Medication Dose Route Frequency Provider Last Rate Last Admin  . [START ON 05/24/2020] bupivacaine liposome (EXPAREL) 1.3 % injection 266 mg  20 mL Other Once Melrose Nakayama, MD      . Derrill Memo ON 05/24/2020] tranexamic acid (CYKLOKAPRON) 2,000 mg in sodium chloride 0.9 % 50 mL Topical Application  7,425 mg Topical To OR Melrose Nakayama, MD       Current Outpatient Medications  Medication Sig Dispense Refill Last Dose  . Ascorbic Acid (VITAMIN C) 1000 MG tablet Take 1,000 mg by mouth daily.     Marland Kitchen aspirin 81 MG tablet Take 81 mg by mouth daily.     Marland Kitchen b complex vitamins tablet Take 1 tablet by mouth in the morning.     Marland Kitchen buPROPion (WELLBUTRIN XL) 150 MG 24 hr tablet TAKE 1 TABLET BY MOUTH EVERY DAY 90 tablet 2   . CALCIUM-MAGNESIUM-ZINC PO Take 3 capsules by mouth daily.     Marland Kitchen CINNAMON PO Take 1,200 mg by mouth daily.      . Coenzyme Q10 (CO Q 10) 100 MG CAPS Take 100 mg by mouth daily.      . fluticasone (FLONASE) 50 MCG/ACT nasal spray Place 2 sprays into both nostrils daily as needed for allergies.      Marland Kitchen glucosamine-chondroitin 500-400 MG tablet Take 2 tablets by mouth daily.      . Magnesium 500 MG TABS Take 500 mg by mouth at bedtime.      . Misc Natural Products (TART CHERRY ADVANCED PO) Take 1,200 mg by mouth at bedtime.      . Multiple Vitamin (MULTIVITAMIN) tablet Take 1 tablet by mouth daily.     . naloxone (NARCAN) 2 MG/2ML injection Inject 1 mL (1 mg total) into the muscle as needed for up to 2 doses (for opioid overdose). In case of emergency (overdose), inject into muscle of upper arm or leg and call 911. 2 mL 0   . nitroGLYCERIN (NITROSTAT) 0.4 MG SL tablet Place 1 tablet  (0.4 mg total) under the tongue every 5 (five) minutes as needed for chest pain. Please keep upcoming appt in October. Thank you 25 tablet 1   . NONFORMULARY OR COMPOUNDED ITEM 212.8 mcg by Epidural Infusion route. Medtronic Neuromodulation pump  Fentanyl 1,000.0 mcg/ml Baclofen 240.0 mcg/ml Bupivacaine 20.0 mg/ml Total daily dose 269.6 mcg/day     . NONFORMULARY OR COMPOUNDED ITEM 88.77 mcg by Epidural Infusion route Continuous EPIDURAL. Medtronic Neuromodulation pump: Fentanyl, baclofen 88.77, bupivicane      . NONFORMULARY OR COMPOUNDED ITEM 5.918 mg by Epidural Infusion route Continuous EPIDURAL. Medtronic Neuromodulation pump: Fentanyl, Baclofen, Bupivicaine 5.918 mg      . Omega-3 Fatty Acids (FISH  OIL) 1200 MG CAPS Take 1,200 mg by mouth in the morning.      Marland Kitchen OVER THE COUNTER MEDICATION Take 1 capsule by mouth daily. Schigandra Plus     . PARoxetine (PAXIL) 20 MG tablet TAKE 0.5 TABLET BY MOUTH DAILY IN THE MORNING 45 tablet 1   . polyethylene glycol (MIRALAX / GLYCOLAX) packet Take 17 g by mouth daily.      . pregabalin (LYRICA) 150 MG capsule Take 1 capsule (150 mg total) by mouth 3 (three) times daily. 90 capsule 2   . rosuvastatin (CRESTOR) 20 MG tablet TAKE 1 TABLET BY MOUTH EVERY DAY 90 tablet 3   . tiZANidine (ZANAFLEX) 4 MG tablet Take 1 tablet (4 mg total) by mouth 3 (three) times daily. 90 tablet 2   . Vitamin E 100 units TABS Take 100 Units by mouth daily.      Marland Kitchen ACCU-CHEK AVIVA PLUS test strip USE TO CHECK BLOOD SUGAR ONE TIME A DAY 100 strip 5   . Accu-Chek Softclix Lancets lancets USE TO CHECK BLOOD SUGAR 1 TIME DAILY 100 each 2   . AMBULATORY NON FORMULARY MEDICATION Medication Name: CPAP MASK OF CHOICE FOR HOME DEVICE 1 each 0   . Caffeine-Magnesium Salicylate (DIUREX PO) Take 1 tablet by mouth daily as needed.    Not Taking at Unknown time  . [START ON 07/11/2020] oxyCODONE-acetaminophen (PERCOCET) 5-325 MG tablet Take 1 tablet by mouth every 8 (eight) hours as needed for  severe pain. 90 tablet 0    No Known Allergies  Social History   Tobacco Use  . Smoking status: Former Smoker    Packs/day: 1.50    Years: 32.00    Pack years: 48.00    Types: Cigarettes    Quit date: 07/22/2007    Years since quitting: 12.8  . Smokeless tobacco: Never Used  Substance Use Topics  . Alcohol use: Yes    Alcohol/week: 0.0 standard drinks    Comment: occ - Holidays    Family History  Problem Relation Age of Onset  . Lung cancer Mother   . Diabetes Father   . Heart disease Maternal Grandfather   . Diabetes Paternal Grandmother   . Colon cancer Paternal Grandfather   . Stroke Neg Hx      Review of Systems  Musculoskeletal: Positive for arthralgias.       Right knee  All other systems reviewed and are negative.   Objective:  Physical Exam Constitutional:      Appearance: Normal appearance.  HENT:     Head: Normocephalic and atraumatic.     Nose: Nose normal.     Mouth/Throat:     Pharynx: Oropharynx is clear.  Eyes:     Extraocular Movements: Extraocular movements intact.  Cardiovascular:     Rate and Rhythm: Normal rate and regular rhythm.  Pulmonary:     Effort: Pulmonary effort is normal.  Abdominal:     Palpations: Abdomen is soft.  Musculoskeletal:     Cervical back: Normal range of motion.     Comments: Right knee motion is about 5-100.  I do not feel an effusion.  She has more lateral than medial joint line pain and a mild valgus deformity.  Sensation and motor function are intact distally.  Palpable pulses.  Calf is soft and nontender.    Skin:    General: Skin is warm and dry.  Neurological:     General: No focal deficit present.     Mental Status:  She is alert and oriented to person, place, and time.  Psychiatric:        Mood and Affect: Mood normal.        Behavior: Behavior normal.        Thought Content: Thought content normal.        Judgment: Judgment normal.     Vital signs in last 24 hours:    Labs:   Estimated body  mass index is 33.67 kg/m as calculated from the following:   Height as of 05/19/20: 5\' 9"  (1.753 m).   Weight as of 05/19/20: 103.4 kg.   Imaging Review Plain radiographs demonstrate severe degenerative joint disease of the right knee(s). The overall alignment isneutral. The bone quality appears to be good for age and reported activity level.      Assessment/Plan:  End stage primary arthritis, right knee   The patient history, physical examination, clinical judgment of the provider and imaging studies are consistent with end stage degenerative joint disease of the right knee(s) and total knee arthroplasty is deemed medically necessary. The treatment options including medical management, injection therapy arthroscopy and arthroplasty were discussed at length. The risks and benefits of total knee arthroplasty were presented and reviewed. The risks due to aseptic loosening, infection, stiffness, patella tracking problems, thromboembolic complications and other imponderables were discussed. The patient acknowledged the explanation, agreed to proceed with the plan and consent was signed. Patient is being admitted for inpatient treatment for surgery, pain control, PT, OT, prophylactic antibiotics, VTE prophylaxis, progressive ambulation and ADL's and discharge planning. The patient is planning to be discharged home with home health services     Patient's anticipated LOS is less than 2 midnights, meeting these requirements: - Younger than 76 - Lives within 1 hour of care - Has a competent adult at home to recover with post-op recover - NO history of  - Chronic pain requiring opiods  - Diabetes  - Coronary Artery Disease  - Heart failure  - Heart attack  - Stroke  - DVT/VTE  - Cardiac arrhythmia  - Respiratory Failure/COPD  - Renal failure  - Anemia  - Advanced Liver disease

## 2020-05-23 NOTE — Assessment & Plan Note (Signed)
Compliant with auto CPAP - continue perioperatively.

## 2020-05-23 NOTE — Assessment & Plan Note (Signed)
Has received cards clearance

## 2020-05-23 NOTE — Assessment & Plan Note (Signed)
Has plan for pain management post op through ortho.

## 2020-05-24 ENCOUNTER — Encounter (HOSPITAL_COMMUNITY): Payer: Self-pay | Admitting: Orthopaedic Surgery

## 2020-05-24 ENCOUNTER — Encounter (HOSPITAL_COMMUNITY)
Admission: RE | Disposition: A | Discharge status: Home / Self Care | Payer: Self-pay | Source: Home / Self Care | Attending: Orthopaedic Surgery

## 2020-05-24 ENCOUNTER — Ambulatory Visit (HOSPITAL_COMMUNITY): Payer: Medicare Other | Admitting: Anesthesiology

## 2020-05-24 ENCOUNTER — Ambulatory Visit (HOSPITAL_COMMUNITY): Payer: Medicare Other | Admitting: Physician Assistant

## 2020-05-24 ENCOUNTER — Encounter: Payer: Medicare Other | Admitting: Pain Medicine

## 2020-05-24 ENCOUNTER — Ambulatory Visit (HOSPITAL_COMMUNITY)
Admission: RE | Admit: 2020-05-24 | Discharge: 2020-05-24 | Disposition: A | Discharge status: Home / Self Care | Payer: Medicare Other | Attending: Orthopaedic Surgery | Admitting: Orthopaedic Surgery

## 2020-05-24 DIAGNOSIS — Z79891 Long term (current) use of opiate analgesic: Secondary | ICD-10-CM | POA: Insufficient documentation

## 2020-05-24 DIAGNOSIS — Z89421 Acquired absence of other right toe(s): Secondary | ICD-10-CM | POA: Diagnosis not present

## 2020-05-24 DIAGNOSIS — M1711 Unilateral primary osteoarthritis, right knee: Secondary | ICD-10-CM | POA: Diagnosis present

## 2020-05-24 DIAGNOSIS — E1141 Type 2 diabetes mellitus with diabetic mononeuropathy: Secondary | ICD-10-CM | POA: Diagnosis not present

## 2020-05-24 DIAGNOSIS — M6281 Muscle weakness (generalized): Secondary | ICD-10-CM | POA: Diagnosis not present

## 2020-05-24 DIAGNOSIS — Z955 Presence of coronary angioplasty implant and graft: Secondary | ICD-10-CM | POA: Insufficient documentation

## 2020-05-24 DIAGNOSIS — R262 Difficulty in walking, not elsewhere classified: Secondary | ICD-10-CM | POA: Diagnosis not present

## 2020-05-24 DIAGNOSIS — Z89411 Acquired absence of right great toe: Secondary | ICD-10-CM | POA: Diagnosis not present

## 2020-05-24 DIAGNOSIS — Z7982 Long term (current) use of aspirin: Secondary | ICD-10-CM | POA: Diagnosis not present

## 2020-05-24 DIAGNOSIS — Z79899 Other long term (current) drug therapy: Secondary | ICD-10-CM | POA: Diagnosis not present

## 2020-05-24 DIAGNOSIS — E7800 Pure hypercholesterolemia, unspecified: Secondary | ICD-10-CM | POA: Diagnosis not present

## 2020-05-24 DIAGNOSIS — G8929 Other chronic pain: Secondary | ICD-10-CM

## 2020-05-24 DIAGNOSIS — G894 Chronic pain syndrome: Secondary | ICD-10-CM

## 2020-05-24 DIAGNOSIS — Z87891 Personal history of nicotine dependence: Secondary | ICD-10-CM | POA: Insufficient documentation

## 2020-05-24 DIAGNOSIS — M542 Cervicalgia: Secondary | ICD-10-CM

## 2020-05-24 DIAGNOSIS — M5441 Lumbago with sciatica, right side: Secondary | ICD-10-CM

## 2020-05-24 DIAGNOSIS — G8918 Other acute postprocedural pain: Secondary | ICD-10-CM | POA: Diagnosis not present

## 2020-05-24 DIAGNOSIS — M7918 Myalgia, other site: Secondary | ICD-10-CM

## 2020-05-24 HISTORY — PX: TOTAL KNEE ARTHROPLASTY: SHX125

## 2020-05-24 LAB — TYPE AND SCREEN
ABO/RH(D): O POS
Antibody Screen: NEGATIVE

## 2020-05-24 SURGERY — ARTHROPLASTY, KNEE, TOTAL
Anesthesia: Spinal | Site: Knee | Laterality: Right

## 2020-05-24 MED ORDER — PROPOFOL 1000 MG/100ML IV EMUL
INTRAVENOUS | Status: AC
  Filled 2020-05-24: qty 100

## 2020-05-24 MED ORDER — PHENYLEPHRINE HCL-NACL 10-0.9 MG/250ML-% IV SOLN
INTRAVENOUS | Status: AC
  Filled 2020-05-24: qty 500

## 2020-05-24 MED ORDER — LACTATED RINGERS IV BOLUS: 250.0000 mL | Freq: Once | INTRAVENOUS | Status: DC

## 2020-05-24 MED ORDER — ONDANSETRON HCL 4 MG/2ML IJ SOLN
INTRAMUSCULAR | Status: DC | PRN
  Administered 2020-05-24: 4 mg via INTRAVENOUS

## 2020-05-24 MED ORDER — ALBUTEROL SULFATE HFA 108 (90 BASE) MCG/ACT IN AERS
INHALATION_SPRAY | RESPIRATORY_TRACT | Status: AC
  Filled 2020-05-24: qty 6.7

## 2020-05-24 MED ORDER — OXYCODONE HCL 5 MG PO TABS
5.0000 mg | ORAL_TABLET | ORAL | Status: DC | PRN
  Administered 2020-05-24 (×2): 10 mg via ORAL

## 2020-05-24 MED ORDER — ORAL CARE MOUTH RINSE: 15.0000 mL | Freq: Once | OROMUCOSAL | Status: AC

## 2020-05-24 MED ORDER — CLONIDINE HCL (ANALGESIA) 100 MCG/ML EP SOLN
EPIDURAL | Status: DC | PRN
  Administered 2020-05-24: 100 ug

## 2020-05-24 MED ORDER — OXYCODONE HCL 5 MG PO TABS: 10.0000 mg | ORAL_TABLET | ORAL | Status: DC | PRN

## 2020-05-24 MED ORDER — DEXAMETHASONE SODIUM PHOSPHATE 10 MG/ML IJ SOLN
INTRAMUSCULAR | Status: AC
  Filled 2020-05-24: qty 2

## 2020-05-24 MED ORDER — MEPERIDINE HCL 50 MG/ML IJ SOLN: 6.2500 mg | INTRAMUSCULAR | Status: DC | PRN

## 2020-05-24 MED ORDER — BUPIVACAINE-EPINEPHRINE 0.25% -1:200000 IJ SOLN
INTRAMUSCULAR | Status: DC | PRN
  Administered 2020-05-24: 30 mL

## 2020-05-24 MED ORDER — POVIDONE-IODINE 10 % EX SWAB
2.0000 "application " | Freq: Once | CUTANEOUS | Status: AC
  Administered 2020-05-24: 2 via TOPICAL

## 2020-05-24 MED ORDER — ROPIVACAINE HCL 7.5 MG/ML IJ SOLN
INTRAMUSCULAR | Status: DC | PRN
  Administered 2020-05-24: 20 mL via PERINEURAL

## 2020-05-24 MED ORDER — ONDANSETRON HCL 4 MG/2ML IJ SOLN
INTRAMUSCULAR | Status: AC
  Filled 2020-05-24: qty 4

## 2020-05-24 MED ORDER — CEFAZOLIN SODIUM-DEXTROSE 2-4 GM/100ML-% IV SOLN
2.0000 g | INTRAVENOUS | Status: AC
  Administered 2020-05-24: 2 g via INTRAVENOUS
  Filled 2020-05-24: qty 100

## 2020-05-24 MED ORDER — LIDOCAINE 2% (20 MG/ML) 5 ML SYRINGE
INTRAMUSCULAR | Status: AC
  Filled 2020-05-24: qty 10

## 2020-05-24 MED ORDER — FENTANYL CITRATE (PF) 100 MCG/2ML IJ SOLN
50.0000 ug | INTRAMUSCULAR | Status: DC
  Administered 2020-05-24: 100 ug via INTRAVENOUS
  Filled 2020-05-24: qty 2

## 2020-05-24 MED ORDER — CEFAZOLIN SODIUM-DEXTROSE 2-4 GM/100ML-% IV SOLN
2.0000 g | Freq: Four times a day (QID) | INTRAVENOUS | Status: DC
  Administered 2020-05-24: 2 g via INTRAVENOUS

## 2020-05-24 MED ORDER — ONDANSETRON HCL 4 MG/2ML IJ SOLN: 4.0000 mg | Freq: Once | INTRAMUSCULAR | Status: DC | PRN

## 2020-05-24 MED ORDER — SODIUM CHLORIDE (PF) 0.9 % IJ SOLN
INTRAMUSCULAR | Status: DC | PRN
  Administered 2020-05-24: 50 mL

## 2020-05-24 MED ORDER — SODIUM CHLORIDE (PF) 0.9 % IJ SOLN
INTRAMUSCULAR | Status: AC
  Filled 2020-05-24: qty 50

## 2020-05-24 MED ORDER — BUPIVACAINE LIPOSOME 1.3 % IJ SUSP
INTRAMUSCULAR | Status: DC | PRN
  Administered 2020-05-24: 20 mL

## 2020-05-24 MED ORDER — STERILE WATER FOR IRRIGATION IR SOLN
Status: DC | PRN
  Administered 2020-05-24: 2000 mL

## 2020-05-24 MED ORDER — PROPOFOL 10 MG/ML IV BOLUS
INTRAVENOUS | Status: AC
  Filled 2020-05-24: qty 40

## 2020-05-24 MED ORDER — LACTATED RINGERS IV SOLN
INTRAVENOUS | Status: DC
  Administered 2020-05-24: 1000 mL via INTRAVENOUS

## 2020-05-24 MED ORDER — LACTATED RINGERS IV BOLUS
250.0000 mL | Freq: Once | INTRAVENOUS | Status: AC
  Administered 2020-05-24: 250 mL via INTRAVENOUS

## 2020-05-24 MED ORDER — TRANEXAMIC ACID 1000 MG/10ML IV SOLN
INTRAVENOUS | Status: DC | PRN
  Administered 2020-05-24: 2000 mg via TOPICAL

## 2020-05-24 MED ORDER — BUPIVACAINE IN DEXTROSE 0.75-8.25 % IT SOLN
INTRATHECAL | Status: DC | PRN
  Administered 2020-05-24: 12 mg via INTRATHECAL

## 2020-05-24 MED ORDER — OXYCODONE HCL 5 MG PO TABS
ORAL_TABLET | ORAL | Status: AC
  Filled 2020-05-24: qty 2

## 2020-05-24 MED ORDER — DEXAMETHASONE SODIUM PHOSPHATE 10 MG/ML IJ SOLN
INTRAMUSCULAR | Status: DC | PRN
  Administered 2020-05-24: 8 mg via INTRAVENOUS

## 2020-05-24 MED ORDER — CHLORHEXIDINE GLUCONATE 0.12 % MT SOLN
15.0000 mL | Freq: Once | OROMUCOSAL | Status: AC
  Administered 2020-05-24: 15 mL via OROMUCOSAL

## 2020-05-24 MED ORDER — PROPOFOL 500 MG/50ML IV EMUL
INTRAVENOUS | Status: AC
  Filled 2020-05-24: qty 50

## 2020-05-24 MED ORDER — HYDROMORPHONE HCL 1 MG/ML IJ SOLN: 0.2500 mg | INTRAMUSCULAR | Status: DC | PRN

## 2020-05-24 MED ORDER — PROPOFOL 500 MG/50ML IV EMUL
INTRAVENOUS | Status: DC | PRN
  Administered 2020-05-24: 75 ug/kg/min via INTRAVENOUS

## 2020-05-24 MED ORDER — LIDOCAINE 2% (20 MG/ML) 5 ML SYRINGE
INTRAMUSCULAR | Status: DC | PRN
  Administered 2020-05-24: 30 mg via INTRAVENOUS
  Administered 2020-05-24: 50 mg via INTRAVENOUS

## 2020-05-24 MED ORDER — LACTATED RINGERS IV SOLN: INTRAVENOUS | Status: DC

## 2020-05-24 MED ORDER — METHOCARBAMOL 500 MG IVPB - SIMPLE MED: 500.0000 mg | Freq: Four times a day (QID) | INTRAVENOUS | Status: DC | PRN

## 2020-05-24 MED ORDER — PROPOFOL 10 MG/ML IV BOLUS
INTRAVENOUS | Status: DC | PRN
  Administered 2020-05-24: 20 mg via INTRAVENOUS
  Administered 2020-05-24: 40 mg via INTRAVENOUS

## 2020-05-24 MED ORDER — ASPIRIN 81 MG PO TABS: 81.0000 mg | ORAL_TABLET | Freq: Two times a day (BID) | ORAL | 0 refills | Status: DC

## 2020-05-24 MED ORDER — TRANEXAMIC ACID-NACL 1000-0.7 MG/100ML-% IV SOLN
1000.0000 mg | Freq: Once | INTRAVENOUS | Status: AC
  Administered 2020-05-24: 1000 mg via INTRAVENOUS

## 2020-05-24 MED ORDER — TRANEXAMIC ACID-NACL 1000-0.7 MG/100ML-% IV SOLN
1000.0000 mg | INTRAVENOUS | Status: AC
  Administered 2020-05-24: 1000 mg via INTRAVENOUS
  Filled 2020-05-24: qty 100

## 2020-05-24 MED ORDER — PROPOFOL 1000 MG/100ML IV EMUL
INTRAVENOUS | Status: AC
  Filled 2020-05-24: qty 200

## 2020-05-24 MED ORDER — OXYCODONE-ACETAMINOPHEN 5-325 MG PO TABS: 1.0000 | ORAL_TABLET | Freq: Four times a day (QID) | ORAL | 0 refills | Status: DC | PRN

## 2020-05-24 MED ORDER — METHOCARBAMOL 500 MG PO TABS: 500.0000 mg | ORAL_TABLET | Freq: Four times a day (QID) | ORAL | Status: DC | PRN

## 2020-05-24 MED ORDER — MIDAZOLAM HCL 2 MG/2ML IJ SOLN
1.0000 mg | INTRAMUSCULAR | Status: DC
  Administered 2020-05-24: 2 mg via INTRAVENOUS
  Filled 2020-05-24: qty 2

## 2020-05-24 MED ORDER — ACETAMINOPHEN 10 MG/ML IV SOLN: 1000.0000 mg | Freq: Once | INTRAVENOUS | Status: DC | PRN

## 2020-05-24 MED ORDER — HYDROCODONE-ACETAMINOPHEN 7.5-325 MG PO TABS: 1.0000 | ORAL_TABLET | Freq: Once | ORAL | Status: DC | PRN

## 2020-05-24 MED ORDER — AMISULPRIDE (ANTIEMETIC) 5 MG/2ML IV SOLN: 10.0000 mg | Freq: Once | INTRAVENOUS | Status: DC | PRN

## 2020-05-24 MED ORDER — 0.9 % SODIUM CHLORIDE (POUR BTL) OPTIME
TOPICAL | Status: DC | PRN
  Administered 2020-05-24: 1000 mL

## 2020-05-24 MED ORDER — TIZANIDINE HCL 4 MG PO TABS: 4.0000 mg | ORAL_TABLET | Freq: Four times a day (QID) | ORAL | 0 refills | Status: DC | PRN

## 2020-05-24 MED ORDER — BUPIVACAINE-EPINEPHRINE (PF) 0.25% -1:200000 IJ SOLN
INTRAMUSCULAR | Status: AC
  Filled 2020-05-24: qty 30

## 2020-05-24 MED ORDER — TRANEXAMIC ACID-NACL 1000-0.7 MG/100ML-% IV SOLN
INTRAVENOUS | Status: AC
  Filled 2020-05-24: qty 100

## 2020-05-24 MED ORDER — DEXMEDETOMIDINE (PRECEDEX) IN NS 20 MCG/5ML (4 MCG/ML) IV SYRINGE
PREFILLED_SYRINGE | INTRAVENOUS | Status: AC
  Filled 2020-05-24: qty 10

## 2020-05-24 MED ORDER — CEFAZOLIN SODIUM-DEXTROSE 2-4 GM/100ML-% IV SOLN
INTRAVENOUS | Status: AC
  Filled 2020-05-24: qty 100

## 2020-05-24 MED ORDER — DEXMEDETOMIDINE (PRECEDEX) IN NS 20 MCG/5ML (4 MCG/ML) IV SYRINGE
PREFILLED_SYRINGE | INTRAVENOUS | Status: DC | PRN
  Administered 2020-05-24: 8 ug via INTRAVENOUS
  Administered 2020-05-24 (×2): 4 ug via INTRAVENOUS

## 2020-05-24 MED ORDER — SODIUM CHLORIDE 0.9 % IR SOLN
Status: DC | PRN
  Administered 2020-05-24: 3000 mL

## 2020-05-24 MED ORDER — LACTATED RINGERS IV BOLUS
500.0000 mL | Freq: Once | INTRAVENOUS | Status: AC
  Administered 2020-05-24: 500 mL via INTRAVENOUS

## 2020-05-24 SURGICAL SUPPLY — 53 items
ATTUNE MED DOME PAT 41 KNEE (Knees) ×1 IMPLANT
ATTUNE PS FEM RT SZ 8 CEM KNEE (Femur) ×1 IMPLANT
ATTUNE PSRP INSR SZ8 5 KNEE (Insert) ×1 IMPLANT
BAG DECANTER FOR FLEXI CONT (MISCELLANEOUS) ×2 IMPLANT
BAG SPEC THK2 15X12 ZIP CLS (MISCELLANEOUS) ×1
BAG ZIPLOCK 12X15 (MISCELLANEOUS) ×2 IMPLANT
BASE TIBIAL ROT PLAT SZ 8 KNEE (Knees) IMPLANT
BLADE SAGITTAL 25.0X1.19X90 (BLADE) ×2 IMPLANT
BLADE SAW SGTL 11.0X1.19X90.0M (BLADE) ×2 IMPLANT
BNDG ELASTIC 6X5.8 VLCR STR LF (GAUZE/BANDAGES/DRESSINGS) ×2 IMPLANT
BOOTIES KNEE HIGH SLOAN (MISCELLANEOUS) ×2 IMPLANT
BOWL SMART MIX CTS (DISPOSABLE) ×2 IMPLANT
BSPLAT TIB 8 CMNT ROT PLAT STR (Knees) ×1 IMPLANT
CEMENT HV SMART SET (Cement) ×4 IMPLANT
COVER SURGICAL LIGHT HANDLE (MISCELLANEOUS) ×2 IMPLANT
COVER WAND RF STERILE (DRAPES) ×2 IMPLANT
CUFF TOURN SGL QUICK 34 (TOURNIQUET CUFF) ×2
CUFF TRNQT CYL 34X4.125X (TOURNIQUET CUFF) ×1 IMPLANT
DECANTER SPIKE VIAL GLASS SM (MISCELLANEOUS) ×4 IMPLANT
DRAPE ORTHO SPLIT 77X108 STRL (DRAPES)
DRAPE SHEET LG 3/4 BI-LAMINATE (DRAPES) ×2 IMPLANT
DRAPE SURG ORHT 6 SPLT 77X108 (DRAPES) IMPLANT
DRAPE TOP 10253 STERILE (DRAPES) ×2 IMPLANT
DRAPE U-SHAPE 47X51 STRL (DRAPES) ×2 IMPLANT
DRSG AQUACEL AG ADV 3.5X10 (GAUZE/BANDAGES/DRESSINGS) ×2 IMPLANT
DURAPREP 26ML APPLICATOR (WOUND CARE) ×4 IMPLANT
ELECT REM PT RETURN 15FT ADLT (MISCELLANEOUS) ×2 IMPLANT
GLOVE BIO SURGEON STRL SZ8 (GLOVE) ×4 IMPLANT
GLOVE BIOGEL PI IND STRL 8 (GLOVE) ×2 IMPLANT
GLOVE BIOGEL PI INDICATOR 8 (GLOVE) ×2
GOWN STRL REUS W/TWL XL LVL3 (GOWN DISPOSABLE) ×4 IMPLANT
HANDPIECE INTERPULSE COAX TIP (DISPOSABLE) ×2
HOLDER FOLEY CATH W/STRAP (MISCELLANEOUS) IMPLANT
HOOD PEEL AWAY FLYTE STAYCOOL (MISCELLANEOUS) ×6 IMPLANT
KIT TURNOVER KIT A (KITS) IMPLANT
MANIFOLD NEPTUNE II (INSTRUMENTS) ×2 IMPLANT
NS IRRIG 1000ML POUR BTL (IV SOLUTION) ×2 IMPLANT
PACK TOTAL KNEE CUSTOM (KITS) ×2 IMPLANT
PAD ARMBOARD 7.5X6 YLW CONV (MISCELLANEOUS) ×2 IMPLANT
PENCIL SMOKE EVACUATOR (MISCELLANEOUS) IMPLANT
PIN DRILL FIX HALF THREAD (BIT) ×1 IMPLANT
PIN STEINMAN FIXATION KNEE (PIN) ×1 IMPLANT
PROTECTOR NERVE ULNAR (MISCELLANEOUS) ×2 IMPLANT
SET HNDPC FAN SPRY TIP SCT (DISPOSABLE) ×1 IMPLANT
SUT ETHIBOND NAB CT1 #1 30IN (SUTURE) ×4 IMPLANT
SUT VIC AB 0 CT1 36 (SUTURE) ×2 IMPLANT
SUT VIC AB 2-0 CT1 27 (SUTURE) ×2
SUT VIC AB 2-0 CT1 TAPERPNT 27 (SUTURE) ×1 IMPLANT
SUT VICRYL AB 3-0 FS1 BRD 27IN (SUTURE) ×2 IMPLANT
TIBIAL BASE ROT PLAT SZ 8 KNEE (Knees) ×2 IMPLANT
TRAY FOLEY MTR SLVR 16FR STAT (SET/KITS/TRAYS/PACK) IMPLANT
WATER STERILE IRR 1000ML POUR (IV SOLUTION) ×2 IMPLANT
WRAP KNEE MAXI GEL POST OP (GAUZE/BANDAGES/DRESSINGS) ×2 IMPLANT

## 2020-05-24 NOTE — Progress Notes (Signed)
AssistedDr. Houser with right, ultrasound guided, adductor canal block. Side rails up, monitors on throughout procedure. See vital signs in flow sheet. Tolerated Procedure well.  

## 2020-05-24 NOTE — Op Note (Signed)
PREOP DIAGNOSIS: DJD RIGHT KNEE POSTOP DIAGNOSIS: same PROCEDURE: RIGHT TKR ANESTHESIA: Spinal and MAC ATTENDING SURGEON: [name] ASSISTANT: Loni Dolly PA  INDICATIONS FOR PROCEDURE: Melinda [name] is a 64 y.o. female who has struggled for a long time with pain due to degenerative arthritis of the right knee.  The patient has failed many conservative non-operative measures and at this point has pain which limits the ability to sleep and walk.  The patient is offered total knee replacement.  Informed operative consent was obtained after discussion of possible risks of anesthesia, infection, neurovascular injury, DVT, and death.  The importance of the post-operative rehabilitation protocol to optimize result was stressed extensively with the patient.  SUMMARY OF FINDINGS AND PROCEDURE:  Melinda [name] was taken to the operative suite where under the above anesthesia a right knee replacement was performed.  There were advanced degenerative changes and the bone quality was good.  We used the DePuy Attune system and placed size 8 femur, 8 tibia, 41 mm all polyethylene patella, and a size 5 mm spacer.  Loni Dolly PA-C assisted throughout and was invaluable to the completion of the case in that he helped retract and maintain exposure while I placed components.  He also helped close thereby minimizing OR time.  The patient was admitted for appropriate post-op care to include perioperative antibiotics and mechanical and pharmacologic measures for DVT prophylaxis.  DESCRIPTION OF PROCEDURE:  Melinda [name] was taken to the operative suite where the above anesthesia was applied.  The patient was positioned supine and prepped and draped in normal sterile fashion.  An appropriate time out was performed.  After the administration of kefzol pre-op antibiotic the leg was elevated and exsanguinated and a tourniquet inflated. A standard longitudinal incision was made on the anterior knee.  Dissection was carried  down to the extensor mechanism.  All appropriate anti-infective measures were used including the pre-operative antibiotic, betadine impregnated drape, and closed hooded exhaust systems for each member of the surgical team.  A medial parapatellar incision was made in the extensor mechanism and the knee cap flipped and the knee flexed.  Some residual meniscal tissues were removed along with any remaining ACL/PCL tissue.  A guide was placed on the tibia and a flat cut was made on it's superior surface.  An intramedullary guide was placed in the femur and was utilized to make anterior and posterior cuts creating an appropriate flexion gap.  A second intramedullary guide was placed in the femur to make a distal cut properly balancing the knee with an extension gap equal to the flexion gap.  The three bones sized to the above mentioned sizes and the appropriate guides were placed and utilized.  A trial reduction was done and the knee easily came to full extension and the patella tracked well on flexion.  The trial components were removed and all bones were cleaned with pulsatile lavage and then dried thoroughly.  Cement was mixed and was pressurized onto the bones followed by placement of the aforementioned components.  Excess cement was trimmed and pressure was held on the components until the cement had hardened.  The tourniquet was deflated and a small amount of bleeding was controlled with cautery and pressure.  The knee was irrigated thoroughly.  The extensor mechanism was re-approximated with #1 ethibond in interrupted fashion.  The knee was flexed and the repair was solid.  The subcutaneous tissues were re-approximated with #0 and #2-0 vicryl and the skin closed with a subcuticular stitch  and steristrips.  A sterile dressing was applied.  Intraoperative fluids, EBL, and tourniquet time can be obtained from anesthesia records.  DISPOSITION:  The patient was taken to recovery room in stable condition and admitted  for appropriate post-op care to include peri-operative antibiotic and DVT prophylaxis with mechanical and pharmacologic measures.  [name] 05/24/2020, 11:14 AM

## 2020-05-24 NOTE — Evaluation (Signed)
Physical Therapy Evaluation Patient Details Name: Melinda [name] MRN: [id number] DOB: Jan 05, 1956 Today's Date: 05/24/2020   History of Present Illness  Patient is 64 y.o. 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    Clinical Impression  Melinda [name] is a 64 y.o. female POD 0 s/p Rt TKA. Patient reports independence with mobility at baseline. Patient is now limited by functional impairments (see PT problem list below) and requires min guard/supervision for transfers and gait with RW. Patient was able to ambulate ~130 feet with RW and min guard/supervision and cues for safe walker management. Patient educated on safe sequencing for stair mobility and verbalized safe guarding position for people assisting with mobility. Patient instructed in exercises to facilitate ROM and circulation. Patient will benefit from continued skilled PT interventions to address impairments and progress towards PLOF. Patient has met mobility goals at adequate level for discharge home; will continue to follow if pt continues acute stay to progress towards Mod I goals.     Follow Up Recommendations Follow surgeon's recommendation for DC plan and follow-up therapies;Home health PT    Equipment Recommendations  Rolling walker with 5" wheels (delivered in PACU)    Recommendations for Other Services       Precautions / Restrictions Precautions Precautions: Fall Restrictions Weight Bearing Restrictions: No Other Position/Activity Restrictions: WBAT      Mobility  Bed Mobility Overal bed mobility: Needs Assistance Bed Mobility: Supine to Sit;Sit to Supine     Supine to sit: Supervision;HOB elevated Sit to supine: Supervision   General bed mobility comments: pt has adjustable bed at home, no assist required to raise trunk or move to EOB. pt able to raise Rt LE back into bed without assist.    Transfers Overall transfer level: Needs  assistance Equipment used: Rolling walker (2 wheeled) Transfers: Sit to/from Stand Sit to Stand: Min guard;Supervision         General transfer comment: cues for safe technique with RW and to extend Rt LE when sitting on low surfaces such as toilet. no assist required for power up from EOB or toilet.   Ambulation/Gait Ambulation/Gait assistance: Min guard;Supervision Gait Distance (Feet): 130 Feet Assistive device: Rolling walker (2 wheeled) Gait Pattern/deviations: Step-to pattern;Decreased stride length;Decreased weight shift to right Gait velocity: decr   General Gait Details: cues for proximity to RW and pt maintained throughout, no overt LOB noted and no buckling at Rt knee.   Stairs Stairs: Yes Stairs assistance: Min guard Stair Management: One rail Right;No rails;Sideways;Forwards;Step to pattern;With walker Number of Stairs: 4 (3, 1) General stair comments: cues for side step pattern with one hand rail " up with good, down with bad". no overt LOB noted pt verbalized safe understanding for family to guard her on steps. Educated on single step up to manage threshold at doorway.   Wheelchair Mobility    Modified Rankin (Stroke Patients Only)       Balance Overall balance assessment: Needs assistance;Mild deficits observed, not formally tested Sitting-balance support: Feet supported Sitting balance-Leahy Scale: Good     Standing balance support: During functional activity;Bilateral upper extremity supported Standing balance-Leahy Scale: Fair                               Pertinent Vitals/Pain Pain Assessment: 0-10 Pain Score: 5  Pain Location: Rt knee Pain Descriptors / Indicators: Aching;Cramping Pain Intervention(s): Limited activity within patient's tolerance;Monitored  during session;Repositioned;Ice applied    Home Living Family/patient expects to be discharged to:: Private residence Living Arrangements: Spouse/significant other Available Help  at Discharge: Family Type of Home: House Home Access: Stairs to enter Entrance Stairs-Rails: Chemical engineer of Steps: 3+1 Home Layout: One level Home Equipment: Toilet riser      Prior Function Level of Independence: Independent               Hand Dominance   Dominant Hand: Right    Extremity/Trunk Assessment   Upper Extremity Assessment Upper Extremity Assessment: Overall WFL for tasks assessed    Lower Extremity Assessment Lower Extremity Assessment: RLE deficits/detail RLE Deficits / Details: good quad activation, no extensor lag with SLR RLE Sensation: WNL RLE Coordination: WNL    Cervical / Trunk Assessment Cervical / Trunk Assessment: Normal  Communication   Communication: No difficulties  Cognition Arousal/Alertness: Awake/alert Behavior During Therapy: WFL for tasks assessed/performed Overall Cognitive Status: Within Functional Limits for tasks assessed                                        General Comments      Exercises Total Joint Exercises Ankle Circles/Pumps: AROM;Both;Supine;10 reps Quad Sets: AROM;Right;5 reps;Supine Heel Slides: AROM;Right;5 reps;Supine   Assessment/Plan    PT Assessment Patient needs continued PT services  PT Problem List Decreased strength;Decreased range of motion;Decreased activity tolerance;Decreased balance;Decreased mobility;Decreased knowledge of use of DME;Decreased knowledge of precautions;Pain       PT Treatment Interventions DME instruction;Gait training;Stair training;Functional mobility training;Therapeutic exercise;Therapeutic activities;Balance training;Patient/family education    PT Goals (Current goals can be found in the Care Plan section)  Acute Rehab PT Goals Patient Stated Goal: get back to woodworking PT Goal Formulation: With patient Time For Goal Achievement: 05/31/20 Potential to Achieve Goals: Good    Frequency 7X/week   Barriers to discharge         Co-evaluation               AM-PAC PT "6 Clicks" Mobility  Outcome Measure Help needed turning from your back to your side while in a flat bed without using bedrails?: None Help needed moving from lying on your back to sitting on the side of a flat bed without using bedrails?: None Help needed moving to and from a bed to a chair (including a wheelchair)?: A Little Help needed standing up from a chair using your arms (e.g., wheelchair or bedside chair)?: A Little Help needed to walk in hospital room?: A Little Help needed climbing 3-5 steps with a railing? : A Little 6 Click Score: 20    End of Session Equipment Utilized During Treatment: Gait belt Activity Tolerance: Patient tolerated treatment well Patient left: in bed;with call bell/phone within reach Nurse Communication: Mobility status PT Visit Diagnosis: Muscle weakness (generalized) (M62.81);Difficulty in walking, not elsewhere classified (R26.2)    Time: 1027-2536 PT Time Calculation (min) (ACUTE ONLY): 41 min   Charges:   PT Evaluation $PT Eval Low Complexity: 1 Low PT Treatments $Gait Training: 8-22 mins $Therapeutic Exercise: 8-22 mins        Verner Mould, DPT Acute Rehabilitation Services  Office [phone] Pager [phone]  05/24/2020 4:55 PM

## 2020-05-24 NOTE — Transfer of Care (Signed)
Immediate Anesthesia Transfer of Care Note  Patient: Melinda [name]  Procedure(s) Performed: Procedure(s): RIGHT TOTAL KNEE ARTHROPLASTY (Right)  Patient Location: PACU  Anesthesia Type:Spinal  Level of Consciousness:  sedated, patient cooperative and responds to stimulation  Airway & Oxygen Therapy:Patient Spontanous Breathing and Patient connected to face mask oxgen  Post-op Assessment:  Report given to PACU RN and Post -op Vital signs reviewed and stable  Post vital signs:  Reviewed and stable  Last Vitals:  Vitals:   05/24/20 0813 05/24/20 1135  BP:  133/73  Pulse: 68 70  Resp: 20 16  Temp:  (P) 36.7 C  SpO2: 03% 50%    Complications: No apparent anesthesia complications

## 2020-05-24 NOTE — Anesthesia Procedure Notes (Addendum)
Spinal  Patient location during procedure: OR Start time: 05/24/2020 9:18 AM End time: 05/24/2020 9:32 AM Staffing Anesthesiologist: Barnet Glasgow, MD Preanesthetic Checklist Completed: patient identified, IV checked, site marked, risks and benefits discussed, surgical consent, monitors and equipment checked, pre-op evaluation and timeout performed Spinal Block Patient position: sitting Prep: DuraPrep Patient monitoring: heart rate, cardiac monitor, continuous pulse ox and blood pressure Approach: midline Location: L4-5 Injection technique: single-shot Needle Needle type: Sprotte  Needle gauge: 24 G Needle length: 9 cm Needle insertion depth: 8 cm Assessment Sensory level: T4 Additional Notes 3 Attempts

## 2020-05-24 NOTE — Anesthesia Procedure Notes (Addendum)
Anesthesia Regional Block: Adductor canal block   Pre-Anesthetic Checklist: ,, timeout performed, Correct Patient, Correct Site, Correct Laterality, Correct Procedure, Correct Position, site marked, Risks and benefits discussed,  Surgical consent,  Pre-op evaluation,  At surgeon's request and post-op pain management  Laterality: Lower and Left  Prep: chloraprep       Needles:  Injection technique: Single-shot  Needle Type: Echogenic Needle     Needle Length: 9cm  Needle Gauge: 22     Additional Needles:   Procedures:,,,, ultrasound used (permanent image in chart),,,,  Narrative:  Start time: 05/24/2020 8:00 AM End time: 05/24/2020 8:08 AM Injection made incrementally with aspirations every 5 mL.  Performed by: Personally  Anesthesiologist: Barnet Glasgow, MD  Additional Notes: Block assessed prior to surgery. Pt tolerated procedure well.

## 2020-05-24 NOTE — Interval H&P Note (Signed)
History and Physical Interval Note:  05/24/2020 7:26 AM  Melinda [name]  has presented today for surgery, with the diagnosis of RIGHT KNEE DEGENERATIVE JOINT DISEASE.  The various methods of treatment have been discussed with the patient and family. After consideration of risks, benefits and other options for treatment, the patient has consented to  Procedure(s): RIGHT TOTAL KNEE ARTHROPLASTY (Right) as a surgical intervention.  The patient's history has been reviewed, patient examined, no change in status, stable for surgery.  I have reviewed the patient's chart and labs.  Questions were answered to the patient's satisfaction.     [name]

## 2020-05-24 NOTE — Anesthesia Postprocedure Evaluation (Signed)
Anesthesia Post Note  Patient: Melinda [name]  Procedure(s) Performed: RIGHT TOTAL KNEE ARTHROPLASTY (Right Knee)     Patient location during evaluation: Nursing Unit Anesthesia Type: Spinal Level of consciousness: oriented and awake and alert Pain management: pain level controlled Vital Signs Assessment: post-procedure vital signs reviewed and stable Respiratory status: spontaneous breathing and respiratory function stable Cardiovascular status: blood pressure returned to baseline and stable Postop Assessment: no headache, no backache, no apparent nausea or vomiting and patient able to bend at knees Anesthetic complications: no   No complications documented.  Last Vitals:  Vitals:   05/24/20 1230 05/24/20 1245  BP: (!) 145/82 (!) 159/100  Pulse: (!) 105 64  Resp: 11 12  Temp:    SpO2: 100% 100%    Last Pain:  Vitals:   05/24/20 1245  TempSrc:   PainSc: 0-No pain                 Barnet Glasgow

## 2020-05-25 ENCOUNTER — Encounter (HOSPITAL_COMMUNITY): Payer: Self-pay | Admitting: Orthopaedic Surgery

## 2020-05-25 DIAGNOSIS — N183 Chronic kidney disease, stage 3 unspecified: Secondary | ICD-10-CM | POA: Diagnosis not present

## 2020-05-25 DIAGNOSIS — E1142 Type 2 diabetes mellitus with diabetic polyneuropathy: Secondary | ICD-10-CM | POA: Diagnosis not present

## 2020-05-25 DIAGNOSIS — G2581 Restless legs syndrome: Secondary | ICD-10-CM | POA: Diagnosis not present

## 2020-05-25 DIAGNOSIS — M150 Primary generalized (osteo)arthritis: Secondary | ICD-10-CM | POA: Diagnosis not present

## 2020-05-25 DIAGNOSIS — M47812 Spondylosis without myelopathy or radiculopathy, cervical region: Secondary | ICD-10-CM | POA: Diagnosis not present

## 2020-05-25 DIAGNOSIS — E1122 Type 2 diabetes mellitus with diabetic chronic kidney disease: Secondary | ICD-10-CM | POA: Diagnosis not present

## 2020-05-25 DIAGNOSIS — M47816 Spondylosis without myelopathy or radiculopathy, lumbar region: Secondary | ICD-10-CM | POA: Diagnosis not present

## 2020-05-25 DIAGNOSIS — Z471 Aftercare following joint replacement surgery: Secondary | ICD-10-CM | POA: Diagnosis not present

## 2020-05-25 DIAGNOSIS — M5137 Other intervertebral disc degeneration, lumbosacral region: Secondary | ICD-10-CM | POA: Diagnosis not present

## 2020-05-25 DIAGNOSIS — Z89421 Acquired absence of other right toe(s): Secondary | ICD-10-CM | POA: Diagnosis not present

## 2020-05-25 DIAGNOSIS — K589 Irritable bowel syndrome without diarrhea: Secondary | ICD-10-CM | POA: Diagnosis not present

## 2020-05-25 DIAGNOSIS — I2510 Atherosclerotic heart disease of native coronary artery without angina pectoris: Secondary | ICD-10-CM | POA: Diagnosis not present

## 2020-05-25 DIAGNOSIS — G4733 Obstructive sleep apnea (adult) (pediatric): Secondary | ICD-10-CM | POA: Diagnosis not present

## 2020-05-25 DIAGNOSIS — I959 Hypotension, unspecified: Secondary | ICD-10-CM | POA: Diagnosis not present

## 2020-05-25 DIAGNOSIS — G894 Chronic pain syndrome: Secondary | ICD-10-CM | POA: Diagnosis not present

## 2020-05-25 DIAGNOSIS — E785 Hyperlipidemia, unspecified: Secondary | ICD-10-CM | POA: Diagnosis not present

## 2020-05-25 DIAGNOSIS — Z89411 Acquired absence of right great toe: Secondary | ICD-10-CM | POA: Diagnosis not present

## 2020-05-25 DIAGNOSIS — F418 Other specified anxiety disorders: Secondary | ICD-10-CM | POA: Diagnosis not present

## 2020-05-25 DIAGNOSIS — M47817 Spondylosis without myelopathy or radiculopathy, lumbosacral region: Secondary | ICD-10-CM | POA: Diagnosis not present

## 2020-05-25 DIAGNOSIS — Z87891 Personal history of nicotine dependence: Secondary | ICD-10-CM | POA: Diagnosis not present

## 2020-05-25 DIAGNOSIS — M5136 Other intervertebral disc degeneration, lumbar region: Secondary | ICD-10-CM | POA: Diagnosis not present

## 2020-05-25 DIAGNOSIS — M797 Fibromyalgia: Secondary | ICD-10-CM | POA: Diagnosis not present

## 2020-05-25 DIAGNOSIS — Z96651 Presence of right artificial knee joint: Secondary | ICD-10-CM | POA: Diagnosis not present

## 2020-05-25 LAB — ACTH: C206 ACTH: 49 pg/mL (ref 6–50)

## 2020-05-27 DIAGNOSIS — M797 Fibromyalgia: Secondary | ICD-10-CM | POA: Diagnosis not present

## 2020-05-27 DIAGNOSIS — Z471 Aftercare following joint replacement surgery: Secondary | ICD-10-CM | POA: Diagnosis not present

## 2020-05-27 DIAGNOSIS — N183 Chronic kidney disease, stage 3 unspecified: Secondary | ICD-10-CM | POA: Diagnosis not present

## 2020-05-27 DIAGNOSIS — F418 Other specified anxiety disorders: Secondary | ICD-10-CM | POA: Diagnosis not present

## 2020-05-27 DIAGNOSIS — E1122 Type 2 diabetes mellitus with diabetic chronic kidney disease: Secondary | ICD-10-CM | POA: Diagnosis not present

## 2020-05-27 DIAGNOSIS — I2510 Atherosclerotic heart disease of native coronary artery without angina pectoris: Secondary | ICD-10-CM | POA: Diagnosis not present

## 2020-05-30 ENCOUNTER — Encounter: Payer: Self-pay | Admitting: Internal Medicine

## 2020-05-30 ENCOUNTER — Telehealth: Payer: Self-pay

## 2020-05-30 DIAGNOSIS — I2510 Atherosclerotic heart disease of native coronary artery without angina pectoris: Secondary | ICD-10-CM | POA: Diagnosis not present

## 2020-05-30 DIAGNOSIS — N183 Chronic kidney disease, stage 3 unspecified: Secondary | ICD-10-CM | POA: Diagnosis not present

## 2020-05-30 DIAGNOSIS — Z471 Aftercare following joint replacement surgery: Secondary | ICD-10-CM | POA: Diagnosis not present

## 2020-05-30 DIAGNOSIS — E1122 Type 2 diabetes mellitus with diabetic chronic kidney disease: Secondary | ICD-10-CM | POA: Diagnosis not present

## 2020-05-30 DIAGNOSIS — M797 Fibromyalgia: Secondary | ICD-10-CM | POA: Diagnosis not present

## 2020-05-30 DIAGNOSIS — F418 Other specified anxiety disorders: Secondary | ICD-10-CM | POA: Diagnosis not present

## 2020-05-30 NOTE — Telephone Encounter (Signed)
She had her surgery and called to give [place] her medications she got. Tizanidine 4 mg 1 every 6 hrs prn  40 tabs no refills  Oxycodone 5-325 1-2 every 6 hrs as needed  40 pills no refills filled 05/24/20 ASA 81mg  1 2x daily  30 pills no refills

## 2020-06-01 DIAGNOSIS — Z471 Aftercare following joint replacement surgery: Secondary | ICD-10-CM | POA: Diagnosis not present

## 2020-06-01 DIAGNOSIS — N183 Chronic kidney disease, stage 3 unspecified: Secondary | ICD-10-CM | POA: Diagnosis not present

## 2020-06-01 DIAGNOSIS — E1122 Type 2 diabetes mellitus with diabetic chronic kidney disease: Secondary | ICD-10-CM | POA: Diagnosis not present

## 2020-06-01 DIAGNOSIS — I2510 Atherosclerotic heart disease of native coronary artery without angina pectoris: Secondary | ICD-10-CM | POA: Diagnosis not present

## 2020-06-01 DIAGNOSIS — M797 Fibromyalgia: Secondary | ICD-10-CM | POA: Diagnosis not present

## 2020-06-01 DIAGNOSIS — F418 Other specified anxiety disorders: Secondary | ICD-10-CM | POA: Diagnosis not present

## 2020-06-02 DIAGNOSIS — Z471 Aftercare following joint replacement surgery: Secondary | ICD-10-CM | POA: Diagnosis not present

## 2020-06-02 DIAGNOSIS — I2510 Atherosclerotic heart disease of native coronary artery without angina pectoris: Secondary | ICD-10-CM | POA: Diagnosis not present

## 2020-06-02 DIAGNOSIS — N183 Chronic kidney disease, stage 3 unspecified: Secondary | ICD-10-CM | POA: Diagnosis not present

## 2020-06-02 DIAGNOSIS — F418 Other specified anxiety disorders: Secondary | ICD-10-CM | POA: Diagnosis not present

## 2020-06-02 DIAGNOSIS — E1122 Type 2 diabetes mellitus with diabetic chronic kidney disease: Secondary | ICD-10-CM | POA: Diagnosis not present

## 2020-06-02 DIAGNOSIS — M797 Fibromyalgia: Secondary | ICD-10-CM | POA: Diagnosis not present

## 2020-06-03 DIAGNOSIS — Z471 Aftercare following joint replacement surgery: Secondary | ICD-10-CM | POA: Diagnosis not present

## 2020-06-03 DIAGNOSIS — Z96651 Presence of right artificial knee joint: Secondary | ICD-10-CM | POA: Diagnosis not present

## 2020-06-03 DIAGNOSIS — M25661 Stiffness of right knee, not elsewhere classified: Secondary | ICD-10-CM | POA: Diagnosis not present

## 2020-06-03 DIAGNOSIS — M6281 Muscle weakness (generalized): Secondary | ICD-10-CM | POA: Diagnosis not present

## 2020-06-06 DIAGNOSIS — M6281 Muscle weakness (generalized): Secondary | ICD-10-CM | POA: Diagnosis not present

## 2020-06-06 DIAGNOSIS — Z96651 Presence of right artificial knee joint: Secondary | ICD-10-CM | POA: Diagnosis not present

## 2020-06-06 DIAGNOSIS — M25661 Stiffness of right knee, not elsewhere classified: Secondary | ICD-10-CM | POA: Diagnosis not present

## 2020-06-08 DIAGNOSIS — M25661 Stiffness of right knee, not elsewhere classified: Secondary | ICD-10-CM | POA: Diagnosis not present

## 2020-06-08 DIAGNOSIS — Z96651 Presence of right artificial knee joint: Secondary | ICD-10-CM | POA: Diagnosis not present

## 2020-06-08 DIAGNOSIS — M6281 Muscle weakness (generalized): Secondary | ICD-10-CM | POA: Diagnosis not present

## 2020-06-13 DIAGNOSIS — Z96651 Presence of right artificial knee joint: Secondary | ICD-10-CM | POA: Diagnosis not present

## 2020-06-13 DIAGNOSIS — M6281 Muscle weakness (generalized): Secondary | ICD-10-CM | POA: Diagnosis not present

## 2020-06-13 DIAGNOSIS — M25661 Stiffness of right knee, not elsewhere classified: Secondary | ICD-10-CM | POA: Diagnosis not present

## 2020-06-13 MED FILL — Medication: INTRATHECAL | Qty: 1 | Status: AC

## 2020-06-14 ENCOUNTER — Other Ambulatory Visit: Payer: Self-pay | Admitting: Cardiology

## 2020-06-15 DIAGNOSIS — M6281 Muscle weakness (generalized): Secondary | ICD-10-CM | POA: Diagnosis not present

## 2020-06-15 DIAGNOSIS — M25661 Stiffness of right knee, not elsewhere classified: Secondary | ICD-10-CM | POA: Diagnosis not present

## 2020-06-15 DIAGNOSIS — Z96651 Presence of right artificial knee joint: Secondary | ICD-10-CM | POA: Diagnosis not present

## 2020-06-15 IMAGING — DX DG HAND COMPLETE 3+V*R*
3 series · 3 of 3 positions shown · non-contrast
Comparison: None.

CLINICAL DATA: Raccoon bite on the second and third digits, initial
encounter

EXAM:
RIGHT HAND - COMPLETE 3+ VIEW

[hand ap]
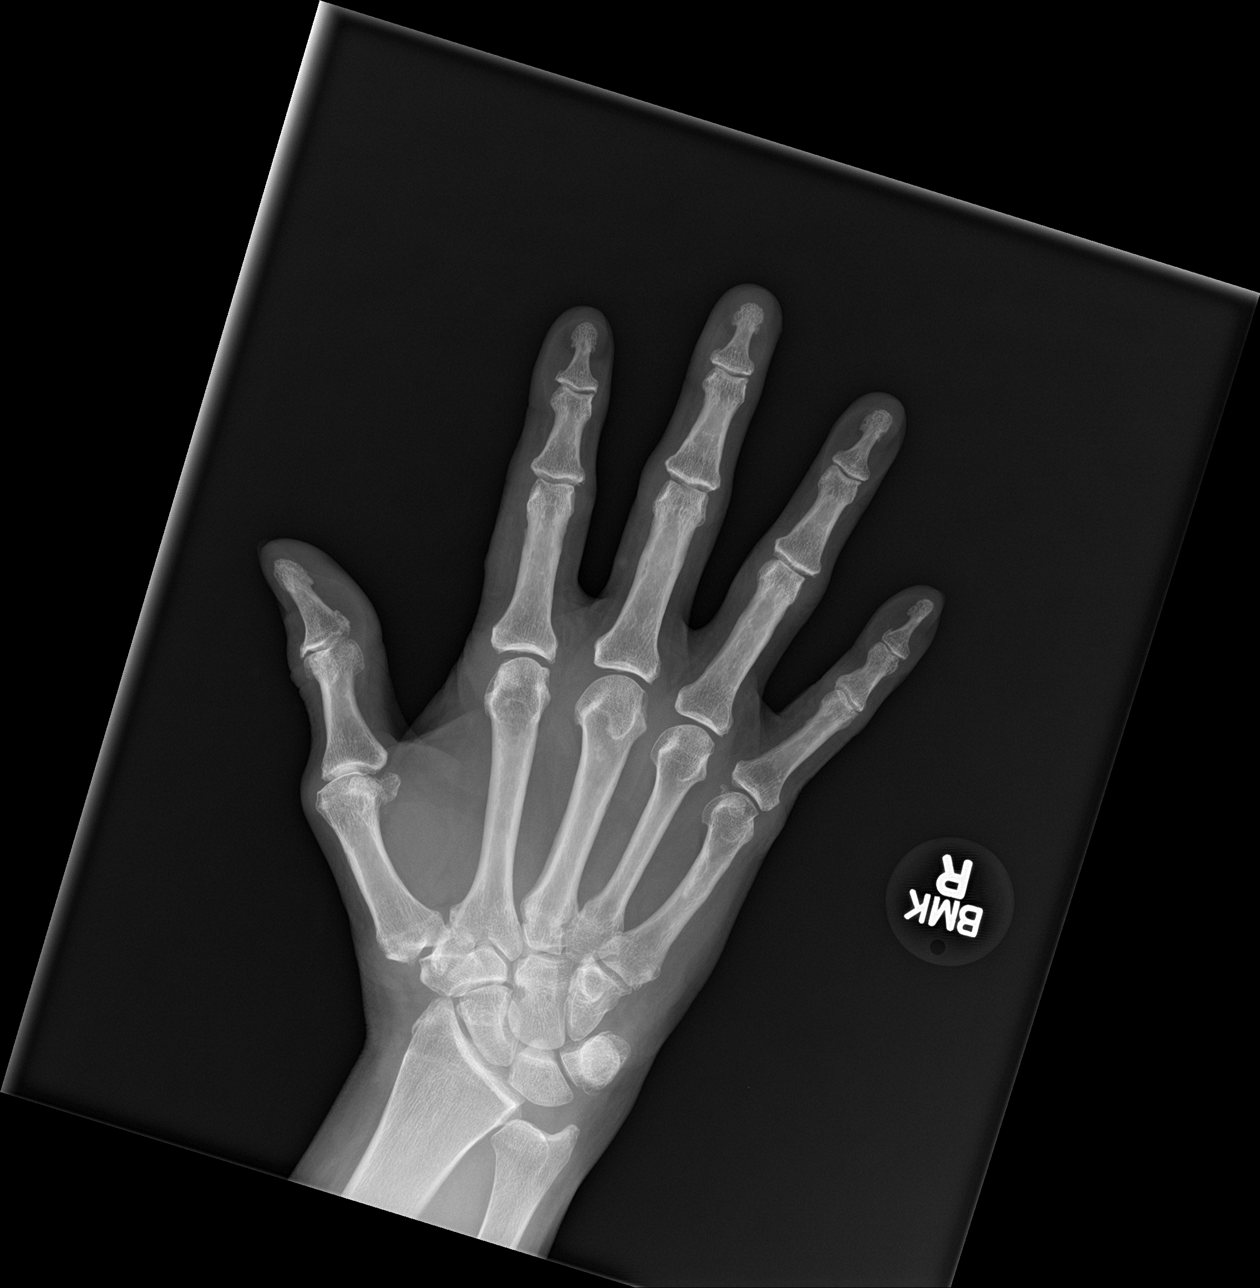

[hand obl]
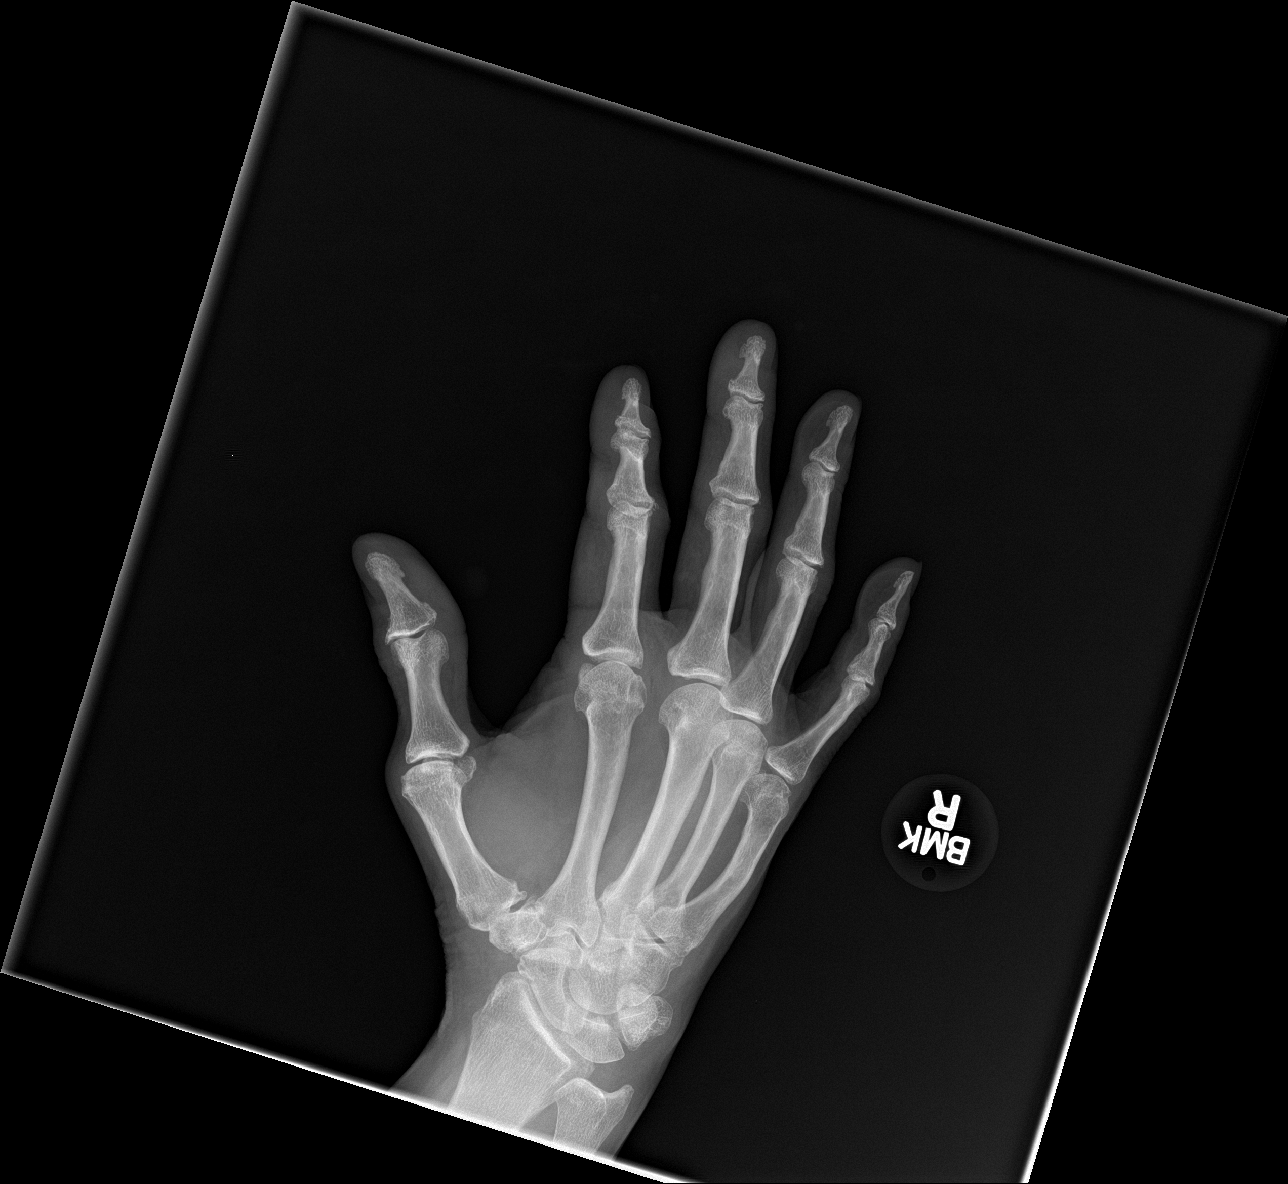

[hand lat]
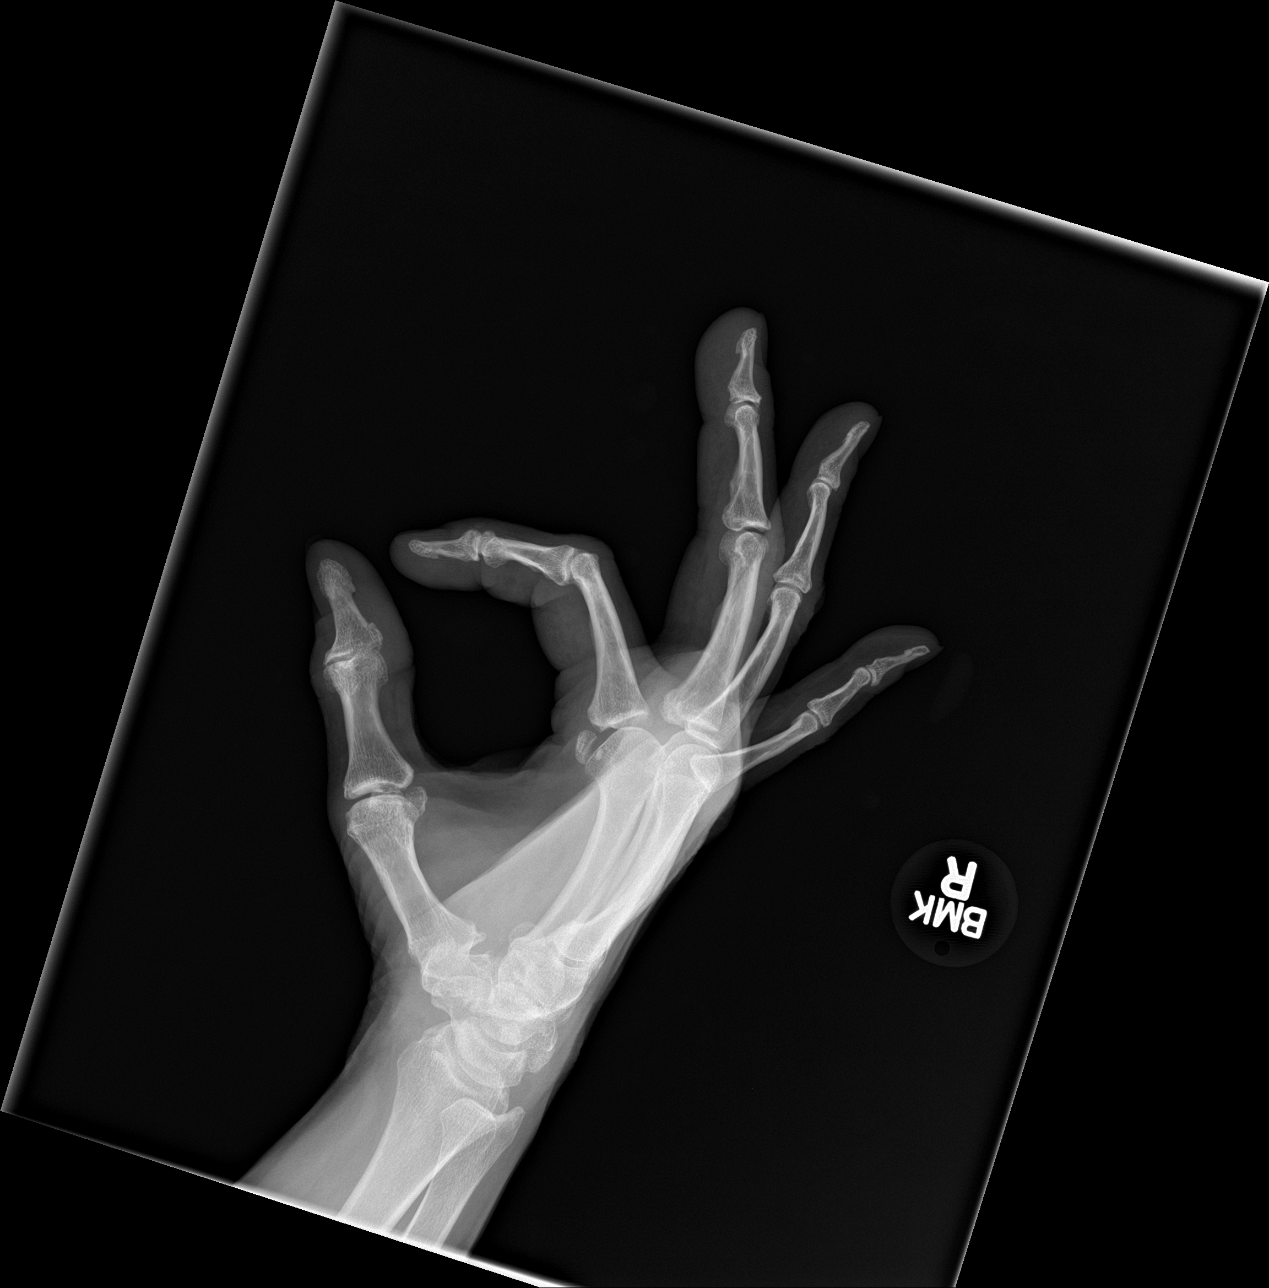

[3 of 3 positions shown; findings below may reference images not displayed]

FINDINGS: No acute fracture or dislocation is noted. No radiopaque foreign
body is noted. Very mild interphalangeal degenerative changes are
seen. No other focal abnormality is seen.
IMPRESSION: Mild degenerative change without acute abnormality.

## 2020-06-20 DIAGNOSIS — M6281 Muscle weakness (generalized): Secondary | ICD-10-CM | POA: Diagnosis not present

## 2020-06-20 DIAGNOSIS — Z96651 Presence of right artificial knee joint: Secondary | ICD-10-CM | POA: Diagnosis not present

## 2020-06-20 DIAGNOSIS — M25661 Stiffness of right knee, not elsewhere classified: Secondary | ICD-10-CM | POA: Diagnosis not present

## 2020-06-22 DIAGNOSIS — Z96651 Presence of right artificial knee joint: Secondary | ICD-10-CM | POA: Diagnosis not present

## 2020-06-22 DIAGNOSIS — M6281 Muscle weakness (generalized): Secondary | ICD-10-CM | POA: Diagnosis not present

## 2020-06-22 DIAGNOSIS — M25661 Stiffness of right knee, not elsewhere classified: Secondary | ICD-10-CM | POA: Diagnosis not present

## 2020-06-24 DIAGNOSIS — Z471 Aftercare following joint replacement surgery: Secondary | ICD-10-CM | POA: Diagnosis not present

## 2020-06-27 DIAGNOSIS — Z96651 Presence of right artificial knee joint: Secondary | ICD-10-CM | POA: Diagnosis not present

## 2020-06-27 DIAGNOSIS — M25661 Stiffness of right knee, not elsewhere classified: Secondary | ICD-10-CM | POA: Diagnosis not present

## 2020-06-27 DIAGNOSIS — M6281 Muscle weakness (generalized): Secondary | ICD-10-CM | POA: Diagnosis not present

## 2020-06-29 DIAGNOSIS — M25661 Stiffness of right knee, not elsewhere classified: Secondary | ICD-10-CM | POA: Diagnosis not present

## 2020-06-29 DIAGNOSIS — Z96651 Presence of right artificial knee joint: Secondary | ICD-10-CM | POA: Diagnosis not present

## 2020-06-29 DIAGNOSIS — M6281 Muscle weakness (generalized): Secondary | ICD-10-CM | POA: Diagnosis not present

## 2020-07-05 DIAGNOSIS — G4733 Obstructive sleep apnea (adult) (pediatric): Secondary | ICD-10-CM

## 2020-07-06 ENCOUNTER — Encounter: Payer: Medicare Other | Admitting: Pain Medicine

## 2020-07-06 NOTE — Telephone Encounter (Signed)
Please advise on patient mychart message  Hi Beth and crew,  After trying about every type of cpap mask available,  my partner and I have pretty much figured out that the loud sounds that I'm making at night are my lips parting. Tape on my lips to keep them closed seemed to work  but it irritated my skin. I think that if you turned down the pressure on the machine a bit, that might be helpful. The first hour or so after I have the cpap on is fine, it's later on that the pressure is so much that it parts my lips. My poor partner has to use ear plugs and she can still hear me. If you're willing to try this, can you do it remotely or how ? Thank you, Melinda [name]

## 2020-07-14 ENCOUNTER — Encounter: Payer: Self-pay | Admitting: Pain Medicine

## 2020-07-15 NOTE — Telephone Encounter (Signed)
My apologies some how this was not address, thank you for following up with [place]! We can certainly lower your pressure. Recommend trying 9-16cm h20. Let me know if this helps or we need to adjust further  Triage can you please place DME order to change CPAP to 9-16cm h20

## 2020-07-15 NOTE — Telephone Encounter (Signed)
Beth, please see mychart message sent by pt as she is still waiting for a response.

## 2020-07-17 NOTE — Progress Notes (Addendum)
Patient: Melinda [name]  Service Category: E/M  Provider: Gaspar Cola, MD  DOB: 02-Jun-1956  DOS: 07/18/2020  Location: Office  MRN: [id number]  Setting: Ambulatory outpatient  Referring Provider: Jearld Fenton, NP  Type: Established Patient  Specialty: Interventional Pain Management  PCP: Jearld Fenton, NP  Location: Remote location  Delivery: TeleHealth     Virtual Encounter - Pain Management PROVIDER NOTE: Information contained herein reflects review and annotations entered in association with encounter. Interpretation of such information and data should be left to medically-trained personnel. Information provided to patient can be located elsewhere in the medical record under "Patient Instructions". Document created using STT-dictation technology, any transcriptional errors that may result from process are unintentional.    Contact & Pharmacy Preferred: [phone] Home: [phone] (home) Mobile: [phone] (mobile) E-mail: [email or web address]  [facility], [place] [address] [place] [address] Phone: [phone] Fax: [phone]   Pre-screening  Melinda [name] offered "in-person" vs "virtual" encounter. She indicated preferring virtual for this encounter.   Reason COVID-19*  Social distancing based on CDC and AMA recommendations.   I contacted Melinda [name] on 07/18/2020 via telephone.      I clearly identified myself as Gaspar Cola, MD. I verified that I was speaking with the correct person using two identifiers (Name: Melinda [name], and date of birth: [birth year]).  Consent I sought verbal advanced consent from Melinda [name] for virtual visit interactions. I informed Melinda [name] of possible security and privacy concerns, risks, and limitations associated with providing "not-in-person" medical evaluation and management services. I also informed Melinda [name] of the availability of "in-person" appointments. Finally, I  informed her that there would be a charge for the virtual visit and that she could be  personally, fully or partially, financially responsible for it. Melinda [name] expressed understanding and agreed to proceed.   Historic Elements   Melinda [name] is a 65 y.o. year old, female patient evaluated today after our last contact on 05/19/2020. Melinda [name]  has a past medical history of Amputation of great toe, right, traumatic ([place]) (05/30/2010), Amputation of second toe, right, traumatic ([place]) (09/2017), Anginal pain ([place]), Anxiety, Blue toes, Bulging disc, CAD (coronary artery disease) (2009), Cardiac arrhythmia due to congenital heart disease (1992), Chicken pox, Chronic fatigue, Chronic kidney disease, Coronary arteritis, Degenerative disc disease, Depression, Diabetes mellitus without complication ([place]), Facet joint disease, Fibromyalgia, Heart disease, Hyperlipidemia, IBS (irritable bowel syndrome), MCL deficiency, knee, MRSA (methicillin resistant staph aureus) culture positive (2011), Neuropathy (04/18/2010), Orthostatic hypotension (01/2020), Peripheral neuropathy, Renal insufficiency, Restless leg syndrome, Sepsis ([place]) (09/2013), Sleep apnea (08/17/2003), and Spinal stenosis. She also  has a past surgical history that includes Foot surgery (Right); Hand surgery (Left); Gallbladder surgery; Ablation; Ablation; HEART STENT (2009); HAND SURGEY (Left); Foot surgery (Bilateral); Foot surgery (Right); Infusion pump implantation; Back surgery; Cholecystectomy (2003); Anterior cervical decomp/discectomy fusion (N/A, 07/28/2014); Carpal tunnel release (Right); Eye surgery (Bilateral, 2013); Amputation toe (Right, 02/01/2017); Irrigation and debridement foot (Right, 02/23/2017); Toe Surgery; Hammer toe surgery (Right, 10/17/2017); Intrathecal pump revision (N/A, 04/25/2018); Intrathecal pump revision (Right, 04/25/2018); Coronary angioplasty (2008); Pain pump revision (N/A, 08/15/2018); Intrathecal pump revision (N/A,  12/12/2018); Intrathecal pump revision (N/A, 03/11/2020); Knee surgery (Right, 05/24/2020); and Total knee arthroplasty (Right, 05/24/2020). Melinda [name] has a current medication list which includes the following prescription(s): accu-chek aviva plus, accu-chek softclix lancets, AMBULATORY NON FORMULARY MEDICATION, vitamin c, aspirin, b complex vitamins, bupropion, caffeine-magnesium salicylate, calcium-magnesium-zinc, cinnamon, co q 10, fluticasone, glucosamine-chondroitin,  magnesium, misc natural products, mobic, multivitamin, naloxone, nitroglycerin, NONFORMULARY OR COMPOUNDED ITEM, fish oil, OVER THE COUNTER MEDICATION, paroxetine, polyethylene glycol, pregabalin, rosuvastatin, tizanidine, vitamin e, [START ON 07/27/2020] oxycodone-acetaminophen, and oxycodone-acetaminophen. She  reports that she quit smoking about 13 years ago. Her smoking use included cigarettes. She has a 48.00 pack-year smoking history. She has never used smokeless tobacco. She reports current alcohol use. She reports current drug use. Melinda [name] has No Known Allergies.   HPI  Today, she is being contacted for medication management.  The patient indicates doing well with the current medication regimen. No adverse reactions or side effects reported to the medications.  The patient refers recently having had her knee replacement and although that is helping with the knee pain, it has caused some back pain secondary to the fact that she has changed her gait.  She indicates that she is currently working with her physical therapy to fix this.  RTCB: 08/26/2020 Nonopioids transfer 05/05/2020: Lyrica & Zanaflex  Pharmacotherapy Assessment  Analgesic: Oxycodone 5 mg, 1 tab PO q 8 hrs (15 mg/day of oxycodone) + Intrathecal PF-Fentanyl MME/day: 22.5 mg/day (oral).   Monitoring: [place] PMP: PDMP reviewed during this encounter.       Pharmacotherapy: No side-effects or adverse reactions reported. Compliance: No problems identified. Effectiveness:  Clinically acceptable. Plan: Refer to "POC".  UDS:  Summary  Date Value Ref Range Status  07/24/2018 FINAL  Final    Comment:    ==================================================================== TOXASSURE SELECT 13 (MW) ==================================================================== Test                             Result       Flag       Units Drug Present and Declared for Prescription Verification   Oxycodone                      754          EXPECTED   ng/mg creat   Oxymorphone                    231          EXPECTED   ng/mg creat   Noroxycodone                   2463         EXPECTED   ng/mg creat   Noroxymorphone                 89           EXPECTED   ng/mg creat    Sources of oxycodone are scheduled prescription medications.    Oxymorphone, noroxycodone, and noroxymorphone are expected    metabolites of oxycodone. Oxymorphone is also available as a    scheduled prescription medication. Drug Present not Declared for Prescription Verification   Fentanyl                       4            UNEXPECTED ng/mg creat   Norfentanyl                    29           UNEXPECTED ng/mg creat    Source of fentanyl is a scheduled prescription medication,    including IV, patch, and transmucosal formulations. Norfentanyl    is an  expected metabolite of fentanyl. ==================================================================== Test                      Result    Flag   Units      Ref Range   Creatinine              134              mg/dL      >=20 ==================================================================== Declared Medications:  The flagging and interpretation on this report are based on the  following declared medications.  Unexpected results may arise from  inaccuracies in the declared medications.  **Note: The testing scope of this panel includes these medications:  Oxycodone  **Note: The testing scope of this panel does not include following  reported medications:   Aspirin (Aspirin 81)  Bupropion (Wellbutrin)  Chondroitin (Glucosamine-Chondroitin)  Cinnamon  Glipizide (Glucotrol)  Glucosamine (Glucosamine-Chondroitin)  Magnesium  Multivitamin  Naloxone  Nitroglycerin (Nitrostat)  Paroxetine  Polyethylene Glycol (GlycoLAX)  Polyethylene Glycol (MiraLAX)  Pregabalin (Lyrica)  Rosuvastatin (Crestor)  Supplement (Omega-3)  Tizanidine (Zanaflex)  Turmeric  Ubiquinone (Coenzyme Q 10)  Vitamin D2 (Drisdol)  Vitamin E ==================================================================== For clinical consultation, please call [phone]. ====================================================================     Laboratory Chemistry Profile   Renal Lab Results  Component Value Date   BUN 26 (H) 05/18/2020   CREATININE 0.97 05/18/2020   GFR 51.87 (L) 03/22/2020   GFRAA >60 04/02/2020   GFRNONAA >60 05/18/2020     Hepatic Lab Results  Component Value Date   AST 20 04/01/2020   ALT 14 04/01/2020   ALBUMIN 4.0 04/01/2020   ALKPHOS 82 04/01/2020   HCVAB NEGATIVE 05/18/2016   LIPASE 11 10/04/2013     Electrolytes Lab Results  Component Value Date   NA 140 05/18/2020   K 4.6 05/18/2020   CL 102 05/18/2020   CALCIUM 9.3 05/18/2020   MG 2.1 06/30/2013     Bone Lab Results  Component Value Date   VD25OH 34.99 03/12/2019     Inflammation (CRP: Acute Phase) (ESR: Chronic Phase) Lab Results  Component Value Date   CRP 1 05/06/2018   ESRSEDRATE 52 (H) 04/02/2020   LATICACIDVEN 0.9 04/01/2020       Note: Above Lab results reviewed.  Imaging  DG Chest 2 View CLINICAL DATA:  Preop right knee replacement  EXAM: CHEST - 2 VIEW  COMPARISON:  09/10/2015  FINDINGS: The heart size and mediastinal contours are within normal limits. Both lungs are clear. ACDF cervical spine. No acute skeletal abnormality.  IMPRESSION: No active cardiopulmonary disease.  Electronically Signed   By: Franchot Gallo M.D.   On: 05/19/2020  15:05  Assessment  The primary encounter diagnosis was Chronic pain syndrome. Diagnoses of Cervicalgia, Chronic low back pain (Bilateral) (L>R), Failed back surgical syndrome, Presence of intrathecal pump (Medtronic intrathecal programmable pump) (40 mL pump), Pharmacologic therapy, and Uncomplicated opioid dependence ([place]) were also pertinent to this visit.  Plan of Care  Problem-specific:  No problem-specific Assessment & Plan notes found for this encounter.  Ms. Shacarra Choe has a current medication list which includes the following long-term medication(s): bupropion, fluticasone, naloxone, nitroglycerin, paroxetine, pregabalin, rosuvastatin, tizanidine, [START ON 07/27/2020] oxycodone-acetaminophen, and oxycodone-acetaminophen.  Pharmacotherapy (Medications Ordered): Meds ordered this encounter  Medications  . oxyCODONE-acetaminophen (PERCOCET) 5-325 MG tablet    Sig: Take 1 tablet by mouth every 8 (eight) hours as needed for severe pain.    Dispense:  90 tablet    Refill:  0    Chronic Pain: STOP Act (Not applicable) Fill 1 day early if closed on refill date. Avoid benzodiazepines within 8 hours of opioids  . oxyCODONE-acetaminophen (PERCOCET) 5-325 MG tablet    Sig: Take 1-2 tablets by mouth every 6 (six) hours as needed for up to 7 days for severe pain.    Dispense:  40 tablet    Refill:  0    Chronic Pain: STOP Act (Not applicable) Fill 1 day early if closed on refill date. Avoid benzodiazepines within 8 hours of opioids   Orders:  No orders of the defined types were placed in this encounter.  Follow-up plan:   Return in about 6 weeks (around 08/26/2020) for scheduled encounter.      Interventional treatment options:  Under consideration: Possible left lumbar facet RFA  Possible right lumbar facet RFA  Diagnostic caudal ESI + diagnostic epidurogram Possible Racz procedure Diagnostic bilateral IA knee injection (Steroid) Diagnostic bilateral genicular NB Possible  bilateral genicular nerve RFA Possible bilateral Hyalgan knee injection DiagnosticcervicalESI Diagnostic bilateral cervical facet block Possible bilateral cervical facet RFA   Therapeutic/palliative (PRN): Palliative/therapeutic intrathecal pump management (refills/programming adjustments)  Palliative left L2-3 LESI #2 Palliative right lumbar facet block #4  Diagnostic left lumbar facet block #2     Recent Visits Date Type Provider Dept  07/18/20 Telemedicine [name], MD [facility]  05/19/20 Procedure visit [name], MD [facility]  Showing recent visits within past 90 days and meeting all other requirements Future Appointments Date Type Provider Dept  08/23/20 Appointment [name], MD [facility]  Showing future appointments within next 90 days and meeting all other requirements  I discussed the assessment and treatment plan with the patient. The patient was provided an opportunity to ask questions and all were answered. The patient agreed with the plan and demonstrated an understanding of the instructions.  Patient advised to call back or seek an in-person evaluation if the symptoms or condition worsens.  Duration of encounter: 14 minutes.  Note by: Gaspar Cola, MD Date: 07/18/2020; Time: 2:17 PM

## 2020-07-18 ENCOUNTER — Other Ambulatory Visit: Payer: Self-pay

## 2020-07-18 ENCOUNTER — Ambulatory Visit: Payer: Medicare Other | Attending: Pain Medicine | Admitting: Pain Medicine

## 2020-07-18 DIAGNOSIS — M961 Postlaminectomy syndrome, not elsewhere classified: Secondary | ICD-10-CM

## 2020-07-18 DIAGNOSIS — G894 Chronic pain syndrome: Secondary | ICD-10-CM | POA: Diagnosis not present

## 2020-07-18 DIAGNOSIS — M5441 Lumbago with sciatica, right side: Secondary | ICD-10-CM | POA: Diagnosis not present

## 2020-07-18 DIAGNOSIS — F1120 Opioid dependence, uncomplicated: Secondary | ICD-10-CM

## 2020-07-18 DIAGNOSIS — M542 Cervicalgia: Secondary | ICD-10-CM

## 2020-07-18 DIAGNOSIS — G8929 Other chronic pain: Secondary | ICD-10-CM

## 2020-07-18 DIAGNOSIS — Z978 Presence of other specified devices: Secondary | ICD-10-CM

## 2020-07-18 DIAGNOSIS — Z79899 Other long term (current) drug therapy: Secondary | ICD-10-CM

## 2020-07-18 MED ORDER — OXYCODONE-ACETAMINOPHEN 5-325 MG PO TABS: 1.0000 | ORAL_TABLET | Freq: Three times a day (TID) | ORAL | 0 refills | Status: DC | PRN

## 2020-07-19 ENCOUNTER — Other Ambulatory Visit: Payer: Self-pay

## 2020-07-20 MED ORDER — OXYCODONE-ACETAMINOPHEN 5-325 MG PO TABS: 1.0000 | ORAL_TABLET | Freq: Four times a day (QID) | ORAL | 0 refills | Status: DC | PRN

## 2020-07-20 NOTE — Addendum Note (Signed)
Addended by: [name] A on: 07/20/2020 02:18 PM   Modules accepted: Orders

## 2020-07-25 ENCOUNTER — Other Ambulatory Visit: Payer: Self-pay

## 2020-07-25 MED ORDER — PAIN MANAGEMENT IT PUMP REFILL: 1.0000 | Freq: Once | INTRATHECAL | 0 refills | Status: AC

## 2020-07-29 ENCOUNTER — Other Ambulatory Visit: Payer: Self-pay | Admitting: Internal Medicine

## 2020-08-01 ENCOUNTER — Other Ambulatory Visit: Payer: Self-pay | Admitting: Internal Medicine

## 2020-08-03 ENCOUNTER — Encounter: Payer: Self-pay | Admitting: Internal Medicine

## 2020-08-03 DIAGNOSIS — G8929 Other chronic pain: Secondary | ICD-10-CM

## 2020-08-03 DIAGNOSIS — M7918 Myalgia, other site: Secondary | ICD-10-CM

## 2020-08-03 DIAGNOSIS — M797 Fibromyalgia: Secondary | ICD-10-CM

## 2020-08-03 MED ORDER — TIZANIDINE HCL 4 MG PO TABS: 4.0000 mg | ORAL_TABLET | Freq: Three times a day (TID) | ORAL | 2 refills | Status: DC | PRN

## 2020-08-03 MED ORDER — PREGABALIN 150 MG PO CAPS: 150.0000 mg | ORAL_CAPSULE | Freq: Three times a day (TID) | ORAL | 2 refills | Status: DC

## 2020-08-09 ENCOUNTER — Ambulatory Visit (INDEPENDENT_AMBULATORY_CARE_PROVIDER_SITE_OTHER): Payer: Medicare Other | Admitting: Internal Medicine

## 2020-08-09 ENCOUNTER — Other Ambulatory Visit: Payer: Self-pay

## 2020-08-09 VITALS — BP 148/80 | HR 70 | Temp 97.6°F | Wt 233.0 lb

## 2020-08-09 DIAGNOSIS — N898 Other specified noninflammatory disorders of vagina: Secondary | ICD-10-CM

## 2020-08-09 DIAGNOSIS — R8290 Unspecified abnormal findings in urine: Secondary | ICD-10-CM

## 2020-08-09 LAB — POC URINALSYSI DIPSTICK (AUTOMATED)
Blood, UA: NEGATIVE
Glucose, UA: NEGATIVE
Nitrite, UA: NEGATIVE
Protein, UA: POSITIVE — AB
Spec Grav, UA: 1.02 (ref 1.010–1.025)
Urobilinogen, UA: 0.2 E.U./dL
pH, UA: 6 (ref 5.0–8.0)

## 2020-08-09 NOTE — Progress Notes (Signed)
Subjective:    Patient ID: Melinda [name], female    DOB: Nov 26, 1955, 65 y.o.   MRN: [id number]  HPI  Patient presents to the clinic today with complaint of urine/vaginal odor and vaginal itching.  She noticed this 2 days ago.  She denies pelvic pain, abnormal skin lesion, vaginal discharge or abnormal bleeding.  She denies fever, chills, nausea, vomiting or worsening low back pain.  She has not taken anything OTC for her symptoms.  Review of Systems      Past Medical History:  Diagnosis Date  . Amputation of great toe, right, traumatic ([place]) 05/30/2010  . Amputation of second toe, right, traumatic ([place]) 09/2017  . Anginal pain ([place])   . Anxiety   . Blue toes    2nd toe on right foot, will get appt.  . Bulging disc   . CAD (coronary artery disease) 2009   s/p stent to LAD  . Cardiac arrhythmia due to congenital heart disease 1992   WPW.  Ablations done.  Now has rare episodes  . Chicken pox   . Chronic fatigue   . Chronic kidney disease    "problem with kidney filtration"  . Coronary arteritis   . Degenerative disc disease    Back, neck, hands, knees  . Depression   . Diabetes mellitus without complication ([place])   . Facet joint disease   . Fibromyalgia   . Heart disease   . Hyperlipidemia   . IBS (irritable bowel syndrome)   . MCL deficiency, knee   . MRSA (methicillin resistant staph aureus) culture positive 2011   GREAT TOE RIGHT FOOT  . Neuropathy 04/18/2010  . Orthostatic hypotension 01/2020  . Peripheral neuropathy   . Renal insufficiency   . Restless leg syndrome   . Sepsis ([place]) 09/2013  . Sleep apnea 08/17/2003   uses CPAP, sleep study at [facility] (mild to moderate)  . Spinal stenosis     Current Outpatient Medications  Medication Sig Dispense Refill  . ACCU-CHEK AVIVA PLUS test strip USE TO CHECK BLOOD SUGAR ONE TIME A DAY 100 strip 5  . Accu-Chek Softclix Lancets lancets USE TO CHECK BLOOD SUGAR 1 TIME DAILY 100 each 2  . AMBULATORY NON  FORMULARY MEDICATION Medication Name: CPAP MASK OF CHOICE FOR HOME DEVICE 1 each 0  . Ascorbic Acid (VITAMIN C) 1000 MG tablet Take 1,000 mg by mouth daily.    Marland Kitchen aspirin 81 MG tablet Take 1 tablet (81 mg total) by mouth 2 (two) times daily after a meal. 30 tablet 0  . b complex vitamins tablet Take 1 tablet by mouth in the morning.    Marland Kitchen buPROPion (WELLBUTRIN XL) 150 MG 24 hr tablet TAKE 1 TABLET BY MOUTH EVERY DAY 90 tablet 2  . Caffeine-Magnesium Salicylate (DIUREX PO) Take 1 tablet by mouth daily as needed.     Marland Kitchen CALCIUM-MAGNESIUM-ZINC PO Take 3 capsules by mouth daily.    Marland Kitchen CINNAMON PO Take 1,200 mg by mouth daily.     . Coenzyme Q10 (CO Q 10) 100 MG CAPS Take 100 mg by mouth daily.     . fluticasone (FLONASE) 50 MCG/ACT nasal spray Place 2 sprays into both nostrils daily as needed for allergies.     Marland Kitchen glucosamine-chondroitin 500-400 MG tablet Take 2 tablets by mouth daily.     . Magnesium 500 MG TABS Take 500 mg by mouth at bedtime.     . Misc Natural Products (TART CHERRY ADVANCED PO) Take 1,200 mg by  mouth at bedtime.     Marland Kitchen MOBIC 15 MG tablet take 1 tablet daily with food for 10-14 days and then as needed after that.    . Multiple Vitamin (MULTIVITAMIN) tablet Take 1 tablet by mouth daily.    . naloxone (NARCAN) 2 MG/2ML injection Inject 1 mL (1 mg total) into the muscle as needed for up to 2 doses (for opioid overdose). In case of emergency (overdose), inject into muscle of upper arm or leg and call 911. 2 mL 0  . nitroGLYCERIN (NITROSTAT) 0.4 MG SL tablet Place 1 tablet (0.4 mg total) under the tongue every 5 (five) minutes as needed for chest pain. Please keep upcoming appt in October. Thank you 25 tablet 1  . NONFORMULARY OR COMPOUNDED ITEM 212.8 mcg by Epidural Infusion route. Medtronic Neuromodulation pump  Fentanyl 1,000.0 mcg/ml Baclofen 240.0 mcg/ml Bupivacaine 20.0 mg/ml Total daily dose 269.6 mcg/day    . Omega-3 Fatty Acids (FISH OIL) 1200 MG CAPS Take 1,200 mg by mouth in the  morning.     Marland Kitchen OVER THE COUNTER MEDICATION Take 1 capsule by mouth daily. Schigandra Plus    . oxyCODONE-acetaminophen (PERCOCET) 5-325 MG tablet Take 1 tablet by mouth every 8 (eight) hours as needed for severe pain. 90 tablet 0  . oxyCODONE-acetaminophen (PERCOCET) 5-325 MG tablet Take 1-2 tablets by mouth every 6 (six) hours as needed for up to 7 days for severe pain. 40 tablet 0  . PARoxetine (PAXIL) 20 MG tablet TAKE 0.5 TABLET BY MOUTH DAILY IN THE MORNING 45 tablet 1  . polyethylene glycol (MIRALAX / GLYCOLAX) packet Take 17 g by mouth daily.    . pregabalin (LYRICA) 150 MG capsule Take 1 capsule (150 mg total) by mouth 3 (three) times daily. 90 capsule 2  . rosuvastatin (CRESTOR) 20 MG tablet TAKE 1 TABLET BY MOUTH EVERY DAY 90 tablet 3  . tiZANidine (ZANAFLEX) 4 MG tablet Take 1 tablet (4 mg total) by mouth every 8 (eight) hours as needed for muscle spasms. 90 tablet 2  . Vitamin E 100 units TABS Take 100 Units by mouth daily.      No current facility-administered medications for this visit.    No Known Allergies  Family History  Problem Relation Age of Onset  . Lung cancer Mother   . Diabetes Father   . Heart disease Maternal Grandfather   . Diabetes Paternal Grandmother   . Colon cancer Paternal Grandfather   . Stroke Neg Hx     Social History   Socioeconomic History  . Marital status: Significant Other    Spouse name: Not on file  . Number of children: Not on file  . Years of education: Not on file  . Highest education level: Not on file  Occupational History  . Not on file  Tobacco Use  . Smoking status: Former Smoker    Packs/day: 1.50    Years: 32.00    Pack years: 48.00    Types: Cigarettes    Quit date: 07/22/2007    Years since quitting: 13.0  . Smokeless tobacco: Never Used  Vaping Use  . Vaping Use: Never used  Substance and Sexual Activity  . Alcohol use: Yes    Alcohol/week: 0.0 standard drinks    Comment: occ - Holidays  . Drug use: Yes     Comment: prescribed pain pump and oxy  . Sexual activity: Yes  Other Topics Concern  . Not on file  Social History Narrative   Lives at  home with a partner. Independent at baseline   Social Determinants of Health   Financial Resource Strain: Not on file  Food Insecurity: Not on file  Transportation Needs: Not on file  Physical Activity: Not on file  Stress: Not on file  Social Connections: Not on file  Intimate Partner Violence: Not on file     Constitutional: Denies fever, malaise, fatigue, headache or abrupt weight changes.  Respiratory: Denies difficulty breathing, shortness of breath, cough or sputum production.   Cardiovascular: Denies chest pain, chest tightness, palpitations or swelling in the hands or feet.  Gastrointestinal: Denies abdominal pain, bloating, constipation, diarrhea or blood in the stool.  GU: Patient reports vaginal/urine, vaginal itching.  Denies urgency, frequency, pain with urination, burning sensation, blood in urine, or discharge. Skin: Denies redness, rashes, lesions or ulcercations.    No other specific complaints in a complete review of systems (except as listed in HPI above).  Objective:   Physical Exam  BP (!) 148/80   Pulse 70   Temp 97.6 F (36.4 C) (Temporal)   Wt 233 lb (105.7 kg)   LMP  (LMP Unknown)   SpO2 96%   BMI 34.41 kg/m   Wt Readings from Last 3 Encounters:  05/23/20 230 lb 5 oz (104.5 kg)  05/19/20 228 lb (103.4 kg)  05/18/20 227 lb (103 kg)    General: Appears her stated age, obese, in NAD. Cardiovascular: Normal rate. Pulmonary/Chest: Normal effort. Pelvic: Self swabbed Neurological: Alert and oriented.   BMET    Component Value Date/Time   NA 140 05/18/2020 1409   NA 135 (L) 06/30/2013 1203   K 4.6 05/18/2020 1409   K 4.1 06/30/2013 1203   CL 102 05/18/2020 1409   CL 102 06/30/2013 1203   CO2 34 (H) 05/18/2020 1409   CO2 32 06/30/2013 1203   GLUCOSE 77 05/18/2020 1409   GLUCOSE 143 (H) 06/30/2013 1203    BUN 26 (H) 05/18/2020 1409   BUN 19 (H) 06/30/2013 1203   CREATININE 0.97 05/18/2020 1409   CREATININE 0.89 06/30/2013 1203   CALCIUM 9.3 05/18/2020 1409   CALCIUM 9.7 06/30/2013 1203   GFRNONAA >60 05/18/2020 1409   GFRNONAA >60 06/30/2013 1203   GFRAA >60 04/02/2020 1041   GFRAA >60 06/30/2013 1203    Lipid Panel     Component Value Date/Time   CHOL 102 03/12/2019 1255   TRIG 158.0 (H) 03/12/2019 1255   HDL 34.30 (L) 03/12/2019 1255   CHOLHDL 3 03/12/2019 1255   VLDL 31.6 03/12/2019 1255   LDLCALC 36 03/12/2019 1255    CBC    Component Value Date/Time   WBC 4.5 05/18/2020 1409   RBC 4.45 05/18/2020 1409   HGB 12.6 05/18/2020 1409   HGB 14.0 11/12/2011 1136   HCT 39.6 05/18/2020 1409   HCT 40.7 11/12/2011 1136   PLT 189 05/18/2020 1409   PLT 274 11/12/2011 1136   MCV 89.0 05/18/2020 1409   MCV 86 11/12/2011 1136   MCH 28.3 05/18/2020 1409   MCHC 31.8 05/18/2020 1409   RDW 12.4 05/18/2020 1409   RDW 12.8 11/12/2011 1136   LYMPHSABS 1.6 05/18/2020 1409   LYMPHSABS 2.5 11/12/2011 1136   MONOABS 0.6 05/18/2020 1409   MONOABS 0.8 11/12/2011 1136   EOSABS 0.3 05/18/2020 1409   EOSABS 0.6 11/12/2011 1136   BASOSABS 0.1 05/18/2020 1409   BASOSABS 0.1 11/12/2011 1136    Hgb A1C Lab Results  Component Value Date   HGBA1C 5.8 (H) 05/18/2020  Assessment & Plan:  Vaginal/urine odor, vaginal itching:  Urinalysis: Small leuks We will send urine culture Self swab wet prep to check for yeast, BV Push fluids  We will follow-up after labs with treatment recommendations, return precautions discussed  [name], NP This visit occurred during the SARS-CoV-2 public health emergency.  Safety protocols were in place, including screening questions prior to the visit, additional usage of staff PPE, and extensive cleaning of exam room while observing appropriate contact time as indicated for disinfecting solutions.

## 2020-08-10 ENCOUNTER — Encounter: Payer: Self-pay | Admitting: Internal Medicine

## 2020-08-10 LAB — WET PREP BY MOLECULAR PROBE
Candida species: NOT DETECTED
MICRO NUMBER:: 11508638
SPECIMEN QUALITY:: ADEQUATE
Trichomonas vaginosis: NOT DETECTED

## 2020-08-10 LAB — URINE CULTURE
MICRO NUMBER:: 11508639
Result:: NO GROWTH
SPECIMEN QUALITY:: ADEQUATE

## 2020-08-10 MED ORDER — METRONIDAZOLE 500 MG PO TABS: 500.0000 mg | ORAL_TABLET | Freq: Three times a day (TID) | ORAL | 0 refills | Status: DC

## 2020-08-11 ENCOUNTER — Encounter: Payer: Self-pay | Admitting: Internal Medicine

## 2020-08-11 NOTE — Patient Instructions (Signed)
Vaginitis  Vaginitis is irritation and swelling of the vagina. Treatment will depend on the cause. What are the causes? It can be caused by:  Bacteria.  Yeast.  A parasite.  A virus.  Low hormone levels.  Bubble baths, scented tampons, and feminine sprays. Other things can change the balance of the yeast and bacteria that live in the vagina. These include:  Antibiotic medicines.  Not being clean enough.  Some birth control methods.  Sex.  Infection.  Diabetes.  A weakened body defense system (immune system). What increases the risk?  Smoking or being around someone who smokes.  Using washes (douches), scented tampons, or scented pads.  Wearing tight pants or thong underwear.  Using birth control pills or an IUD.  Having sex without a condom or having a lot of partners.  Having an STI.  Using a certain product to kill sperm (nonoxynol-9).  Eating foods that are high in sugar.  Having diabetes.  Having low levels of a female hormone.  Having a weakened body defense system.  Being pregnant or breastfeeding. What are the signs or symptoms?  Fluid coming from the vagina that is not normal.  A bad smell.  Itching, pain, or swelling.  Pain with sex.  Pain or burning when you pee (urinate). Sometimes there are no symptoms. How is this treated? Treatment may include:  Antibiotic creams or pills.  Antifungal medicines.  Medicines to ease symptoms if you have a virus. Your sex partner should also be treated.  Estrogen medicines.  Avoiding scented soaps, sprays, or douches.  Stopping use of products that caused irritation and then using a cream to treat symptoms. Follow these instructions at home: Lifestyle  Keep the area around your vagina clean and dry. ? Avoid using soap. ? Rinse the area with water.  Until your doctor says it is okay: ? Do not use washes for the vagina. ? Do not use tampons. ? Do not have sex.  Wipe from front to  back after going to the bathroom.  When your doctor says it is okay, practice safe sex and use condoms. General instructions  Take over-the-counter and prescription medicines only as told by your doctor.  If you were prescribed an antibiotic medicine, take or use it as told by your doctor. Do not stop taking or using it even if you start to feel better.  Keep all follow-up visits. How is this prevented?  Do not use things that can irritate the vagina, such as fabric softeners. Avoid these products if they are scented: ? Sprays. ? Detergents. ? Tampons. ? Products for cleaning the vagina. ? Soaps or bubble baths.  Let air reach your vagina. To do this: ? Wear cotton underwear. ? Do not wear:  Underwear while you sleep.  Tight pants.  Thong underwear.  Underwear or nylons without a cotton panel. ? Take off any wet clothing, such as bathing suits, as soon as you can. ? Practice safe sex and use condoms. Contact a doctor if:  You have pain in your belly or in the area between your hips.  You have a fever or chills.  Your symptoms last for more than 2-3 days. Get help right away if:  You have a fever and your symptoms get worse all of a sudden. Summary  Vaginitis is irritation and swelling of the vagina.  Treatment will depend on the cause of the condition.  Do not use washes or tampons or have sex until your doctor says it   is okay. This information is not intended to replace advice given to you by your health care provider. Make sure you discuss any questions you have with your health care provider. Document Revised: 12/17/2019 Document Reviewed: 12/17/2019 Elsevier Patient Education  [place].

## 2020-08-15 ENCOUNTER — Other Ambulatory Visit (HOSPITAL_COMMUNITY): Payer: Self-pay | Admitting: Orthopaedic Surgery

## 2020-08-15 ENCOUNTER — Other Ambulatory Visit: Payer: Self-pay

## 2020-08-15 ENCOUNTER — Ambulatory Visit (HOSPITAL_COMMUNITY)
Admission: RE | Admit: 2020-08-15 | Discharge: 2020-08-15 | Disposition: A | Discharge status: Home / Self Care | Payer: Medicare Other | Source: Ambulatory Visit | Attending: Orthopaedic Surgery | Admitting: Orthopaedic Surgery

## 2020-08-15 DIAGNOSIS — Z96651 Presence of right artificial knee joint: Secondary | ICD-10-CM

## 2020-08-23 ENCOUNTER — Encounter: Payer: Self-pay | Admitting: Pain Medicine

## 2020-08-23 ENCOUNTER — Telehealth: Payer: Self-pay

## 2020-08-23 ENCOUNTER — Ambulatory Visit: Payer: Medicare Other | Attending: Pain Medicine | Admitting: Pain Medicine

## 2020-08-23 ENCOUNTER — Other Ambulatory Visit: Payer: Self-pay

## 2020-08-23 VITALS — BP 99/83 | HR 80 | Temp 97.3°F | Resp 18 | Ht 69.0 in | Wt 230.0 lb

## 2020-08-23 DIAGNOSIS — Z451 Encounter for adjustment and management of infusion pump: Secondary | ICD-10-CM | POA: Insufficient documentation

## 2020-08-23 DIAGNOSIS — Z79899 Other long term (current) drug therapy: Secondary | ICD-10-CM | POA: Diagnosis present

## 2020-08-23 DIAGNOSIS — G8929 Other chronic pain: Secondary | ICD-10-CM | POA: Diagnosis present

## 2020-08-23 DIAGNOSIS — G894 Chronic pain syndrome: Secondary | ICD-10-CM | POA: Insufficient documentation

## 2020-08-23 DIAGNOSIS — Z978 Presence of other specified devices: Secondary | ICD-10-CM | POA: Diagnosis present

## 2020-08-23 DIAGNOSIS — M542 Cervicalgia: Secondary | ICD-10-CM | POA: Diagnosis present

## 2020-08-23 DIAGNOSIS — M5441 Lumbago with sciatica, right side: Secondary | ICD-10-CM | POA: Insufficient documentation

## 2020-08-23 DIAGNOSIS — M961 Postlaminectomy syndrome, not elsewhere classified: Secondary | ICD-10-CM | POA: Diagnosis present

## 2020-08-23 DIAGNOSIS — S301XXD Contusion of abdominal wall, subsequent encounter: Secondary | ICD-10-CM | POA: Diagnosis present

## 2020-08-23 DIAGNOSIS — T85610D Breakdown (mechanical) of epidural and subdural infusion catheter, subsequent encounter: Secondary | ICD-10-CM | POA: Diagnosis present

## 2020-08-23 DIAGNOSIS — G96198 Other disorders of meninges, not elsewhere classified: Secondary | ICD-10-CM | POA: Insufficient documentation

## 2020-08-23 DIAGNOSIS — F1120 Opioid dependence, uncomplicated: Secondary | ICD-10-CM | POA: Insufficient documentation

## 2020-08-23 DIAGNOSIS — M5137 Other intervertebral disc degeneration, lumbosacral region: Secondary | ICD-10-CM | POA: Diagnosis present

## 2020-08-23 DIAGNOSIS — Z969 Presence of functional implant, unspecified: Secondary | ICD-10-CM

## 2020-08-23 DIAGNOSIS — S301XXA Contusion of abdominal wall, initial encounter: Secondary | ICD-10-CM | POA: Insufficient documentation

## 2020-08-23 DIAGNOSIS — M545 Low back pain, unspecified: Secondary | ICD-10-CM | POA: Insufficient documentation

## 2020-08-23 MED ORDER — OXYCODONE-ACETAMINOPHEN 5-325 MG PO TABS: 1.0000 | ORAL_TABLET | Freq: Three times a day (TID) | ORAL | 0 refills | Status: DC | PRN

## 2020-08-23 MED ORDER — LIDOCAINE HCL (PF) 2 % IJ SOLN
INTRAMUSCULAR | Status: AC
  Filled 2020-08-23: qty 10

## 2020-08-23 NOTE — Telephone Encounter (Signed)
Return for Pump Refill (Max:62mo), in addition schedule in OR for pump pocket revision and anchoring.

## 2020-08-23 NOTE — Progress Notes (Addendum)
Safety precautions to be maintained throughout the outpatient stay will include: orient to surroundings, keep bed in low position, maintain call bell within reach at all times, provide assistance with transfer out of bed and ambulation. 282 ml of yellowish clear fluid withrawn from pump area by [name].

## 2020-08-23 NOTE — Patient Instructions (Addendum)
Opioid Overdose Opioids are drugs that are often used to treat pain. Opioids include illegal drugs, such as heroin, as well as prescription pain medicines, such as codeine, morphine, hydrocodone, oxycodone, and fentanyl. An opioid overdose happens when you take too much of an opioid. An overdose may be intentional or accidental and can happen with any type of opioid. The effects of an overdose can be mild, dangerous, or even deadly. Opioid overdose is a medical emergency. What are the causes? This condition may be caused by:  Taking too much of an opioid on purpose.  Taking too much of an opioid by accident.  Using two or more substances that contain opioids at the same time.  Taking an opioid with a substance that affects your heart, breathing, or blood pressure. These include alcohol, tranquilizers, sleeping pills, illegal drugs, and some over-the-counter medicines. This condition may also happen due to an error made by:  A health care provider who prescribes a medicine.  The pharmacist who fills the prescription order. What increases the risk? This condition is more likely in:  Children. They may be attracted to colorful pills. Because of a child's small size, even a small amount of a drug can be dangerous.  Older people. They may be taking many different drugs. Older people may have difficulty reading labels or remembering when they last took their medicine. They may also be more sensitive to the effects of opioids.  People with chronic medical conditions, especially heart, liver, kidney, or neurological diseases.  People who take an opioid for a long period of time.  People who use: ? Illegal drugs. IV heroin is especially dangerous. ? Other substances, including alcohol, while using an opioid.  People who have: ? A history of drug or alcohol abuse. ? Certain mental health conditions. ? A history of previous drug overdoses.  People who take opioids that are not prescribed  for them. What are the signs or symptoms? Symptoms of this condition depend on the type of opioid and the amount that was taken. Common symptoms include:  Sleepiness or difficulty waking from sleep.  Decrease in attention.  Confusion.  Slurred speech.  Slowed breathing and a slow pulse (bradycardia).  Nausea and vomiting.  Abnormally small pupils. Signs and symptoms that require emergency treatment include:  Cold, clammy, and pale skin.  Blue lips and fingernails.  Vomiting.  Gurgling sounds in the throat.  A pulse that is very slow or difficult to detect.  Breathing that is very irregular, slow, noisy, or difficult to detect.  Limp body.  Inability to respond to speech or be awakened from sleep (stupor).  Seizures. How is this diagnosed? This condition is diagnosed based on your symptoms and medical history. It is important to tell your health care provider:  About all of the opioids that you took.  When you took the opioids.  Whether you were drinking alcohol or using marijuana, cocaine, or other drugs. Your health care provider will do a physical exam. This exam may include:  Checking and monitoring your heart rate and rhythm, breathing rate, temperature, and blood pressure (vital signs).  Measuring oxygen levels in your blood.  Checking for abnormally small pupils. You may also have blood tests or urine tests. You may have X-rays if you are having severe breathing problems. How is this treated? This condition requires immediate medical treatment and hospitalization. Treatment is given in the hospital intensive care (ICU) setting. Supporting your vital signs and your breathing is the first step in   treating an opioid overdose. Treatment may also include:  Giving salts and minerals (electrolytes) along with fluids through an IV.  Inserting a breathing tube (endotracheal tube) in your airway to help you breathe if you cannot breathe on your own or you are in  danger of not being able to breathe on your own.  Giving oxygen through a small tube under your nose.  Passing a tube through your nose and into your stomach (nasogastric tube, or NG tube) to empty your stomach.  Giving medicines that: ? Increase your blood pressure. ? Relieve nausea and vomiting. ? Relieve abdominal pain and cramping. ? Reverse the effects of the opioid (naloxone).  Monitoring your heart and oxygen levels.  Ongoing counseling and mental health support if you intentionally overdosed or used an illegal drug. Follow these instructions at home: Medicines  Take over-the-counter and prescription medicines only as told by your health care provider.  Always ask your health care provider about possible side effects and interactions of any new medicine that you start taking.  Keep a list of all the medicines that you take, including over-the-counter medicines. Bring this list with you to all your medical visits. General instructions  Drink enough fluid to keep your urine pale yellow.  Keep all follow-up visits as told by your health care provider. This is important.   How is this prevented?  Read the drug inserts that come with your opioid pain medicines.  Take medicines only as told by your health care provider. Do not take more medicine than you are told. Do not take medicines more frequently than you are told.  Do not drink alcohol or take sedatives when taking opioids.  Do not use illegal or recreational drugs, including cocaine, ecstasy, and marijuana.  Do not take opioid medicines that are not prescribed for you.  Store all medicines in safety containers that are out of the reach of children.  Get help if you are struggling with: ? Alcohol or drug use. ? Depression or another mental health problem. ? Thoughts of hurting yourself or another person.  Keep the phone number of your local poison control center near your phone or in your mobile phone. In the  [place] the hotline of the National Poison Control Center is (800) 222-1222.  If you were prescribed naloxone, make sure you understand how to take it. Contact a health care provider if you:  Need help understanding how to take your pain medicines.  Feel your medicines are too strong.  Are concerned that your pain medicines are not working well for your pain.  Develop new symptoms or side effects when you are taking medicines. Get help right away if:  You or someone else is having symptoms of an opioid overdose. Get help even if you are not sure.  You have serious thoughts about hurting yourself or others.  You have: ? Chest pain. ? Difficulty breathing. ? A loss of consciousness. These symptoms may represent a serious problem that is an emergency. Do not wait to see if the symptoms will go away. Get medical help right away. Call your local emergency services (911 in the [place] Do not drive yourself to the hospital. If you ever feel like you may hurt yourself or others, or have thoughts about taking your own life, get help right away. You can go to your nearest emergency department or call:  Your local emergency services (911 in the [place]  A suicide crisis helpline, such as the National Suicide Prevention Lifeline   at 1-800-273-8255. This is open 24 hours a day. Summary  Opioids are drugs that are often used to treat pain. Opioids include illegal drugs, such as heroin, as well as prescription pain medicines.  An opioid overdose happens when you take too much of an opioid.  Overdoses can be intentional or accidental.  Opioid overdose is very dangerous. It is a life-threatening emergency.  If you or someone you know is experiencing an opioid overdose, get help right away. This information is not intended to replace advice given to you by your health care provider. Make sure you discuss any questions you have with your health care provider. Document Revised: 06/05/2018 Document  Reviewed: 06/05/2018 Elsevier Patient Education  2021 [employer] Naloxone injection What is this medicine? NALOXONE (nal OX one) is a narcotic blocker. It is used to treat narcotic (opioid) drug overdose. It is used to temporarily reverse the effects of opioid medicines. This medicine has no effect in people who are not taking opioid medicines. This medicine may be used for other purposes; ask your health care provider or pharmacist if you have questions. COMMON BRAND NAME(S): EVZIO, Narcan What should I tell my health care provider before I take this medicine? They need to know if you have any of these conditions:  drug abuse or addiction  heart disease  an unusual or allergic reaction to naloxone, other medicines, foods, dyes, or preservatives  pregnant or trying to get pregnantbreast-feeding How should I use this medicine? This medicine may be administered in a hospital, clinic, or can be used by the public to give aid to a person who has overdosed until emergency medical help is available. This medicine is for injection into the outer thigh. It can be injected through clothing if needed. Get emergency medical help right away after giving the first dose of this medicine, even if the person wakes up. You should be familiar with how to recognize the signs and symptoms of a narcotic overdose. Administer according to the printed instructions on the device label or the electronic voice instructions. You should practice using the Trainer injector before this medicine is needed. Talk to your pediatrician regarding the use of this medicine in children. While this drug may be prescribed for children as young as newborn for selected conditions, precautions do apply. For infants less than 1 year of age, pinch the thigh muscle while administering. Overdosage: If you think you have taken too much of this medicine contact a poison control center or emergency room at once. NOTE: This medicine is only  for you. Do not share this medicine with others. What if I miss a dose? This does not apply. What may interact with this medicine? This medicine is only used during an emergency. No interactions are expected during emergency use. This list may not describe all possible interactions. Give your health care provider a list of all the medicines, herbs, non-prescription drugs, or dietary supplements you use. Also tell them if you smoke, drink alcohol, or use illegal drugs. Some items may interact with your medicine. What should I watch for while using this medicine? Keep this medicine ready for use in the case of a narcotic overdose. Make sure that you have the phone number of your doctor or health care professional and local hospital ready. You may need to have additional doses of this medicine. Each injector contains a single dose. Some emergencies may require additional doses. After use, bring the treated person to the nearest hospital or call 911. Make   sure the treating health care professional knows that the person has received an injection of this medicine. You will receive additional instructions on what to do during and after use of this medicine before an emergency occurs. What side effects may I notice from receiving this medicine? Side effects that you should report to your doctor or health care professional as soon as possible:  allergic reactions like skin rash, itching or hives, swelling of the face, lips, or tongue  breathing problems  fast, irregular heartbeat  high blood pressure  pain that was controlled by narcotic pain medicine  seizures Side effects that usually do not require medical attention (report to your doctor or health care professional if they continue or are bothersome):  anxious  chills  diarrhea  fever  nausea, vomiting  sweating This list may not describe all possible side effects. Call your doctor for medical advice about side effects. You may report  side effects to FDA at 1-800-FDA-1088. Where should I keep my medicine? Keep out of the reach of children. Store at room temperature between 15 and 25 degrees C (59 and 77 degrees F). If you are using this medicine at home, you will be instructed on how to store this medicine. Keep this medicine in its outer case until ready to use. Occasionally check the solution through the viewing window of the injector. The solution should be clear. If it is discolored, cloudy, or contains solid particles, replace it with a new injector. Remember to check the expiration date of this medicine regularly. Throw away any unused medicine after the expiration date. NOTE: This sheet is a summary. It may not cover all possible information. If you have questions about this medicine, talk to your doctor, pharmacist, or health care provider.  2021 Elsevier/Gold Standard (2015-06-28 16:45:38)  

## 2020-08-23 NOTE — Progress Notes (Signed)
PROVIDER NOTE: Information contained herein reflects review and annotations entered in association with encounter. Interpretation of such information and data should be left to medically-trained personnel. Information provided to patient can be located elsewhere in the medical record under "Patient Instructions". Document created using STT-dictation technology, any transcriptional errors that may result from process are unintentional.    Patient: Melinda [name]  Service Category: Procedure  Provider: Gaspar Cola, MD  DOB: 27-Mar-1956  DOS: 08/23/2020  Location: [facility]  MRN: [id number]  Setting: Ambulatory - outpatient  Referring Provider: Jearld Fenton, NP  Type: Established Patient  Specialty: Interventional Pain Management  PCP: Jearld Fenton, NP   Primary Reason for Visit: Interventional Pain Management Treatment. CC: Back Pain (low)  Procedure:          Intrathecal Drug Delivery System (IDDS):  Type: Reservoir Refill [id number]) No rate change Region: Abdominal Laterality: Right  Type of Pump: Medtronic Synchromed II Delivery Route: Intrathecal Type of Pain Treated: Neuropathic/Nociceptive Primary Medication Class: Opioid/opiate  Medication, Concentration, Infusion Program, & Delivery Rate: Please see scanned programming printout.   Indications: 1. Chronic pain syndrome   2. Failed back surgical syndrome   3. Chronic low back pain (Bilateral) (L>R)   4. Chronic low back pain (Bilateral) w/o sciatica   5. DDD (degenerative disc disease), lumbosacral   6. Epidural fibrosis   7. Pharmacologic therapy   8. Uncomplicated opioid dependence ([place])   9. Presence of intrathecal pump (Medtronic intrathecal programmable pump) (40 mL pump)   10. Presence of functional implant (Medtronic programmable intrathecal pump) (Right abdominal area)   11. Encounter for adjustment or management of infusion pump    Pain Assessment: Self-Reported Pain Score: 2 /10              Reported level is compatible with observation.         Unfortunately, the patient returns today again with accumulation of fluid in the abdominal wall seroma where the pump pocket is located.  However this time it is clear that the pump is no longer anchored.  Today we will be emptying the fluid and we will schedule her for a pump revision to again anchor it appropriately so that it will not continue to turn and pull the catheter out of place.  I was able to remove 282 mL of clear serous fluid from the pocket.  Today, before we could access the pump I had to flip it since it had flipped into its back position at home.  The nurses attempted to access the port and they were unable to do so due to the fact of the pump had flipped.  I went ahead and manipulated the pump back into its original position and it was then when I was able to access the refill port.  We removed approximately 12 to 14 mL of the medication.  In view of today's findings, I will be scheduling the patient for a revision and reanchoring.  Pharmacotherapy Assessment  Analgesic: Oxycodone 5 mg, 1 tab PO q 8 hrs (15 mg/day of oxycodone) + Intrathecal PF-Fentanyl MME/day: 22.5 mg/day (oral).   Monitoring: [place] PMP: PDMP reviewed during this encounter.       Pharmacotherapy: No side-effects or adverse reactions reported. Compliance: No problems identified. Effectiveness: Clinically acceptable. Plan: Refer to "POC".  UDS:  Summary  Date Value Ref Range Status  07/24/2018 FINAL  Final    Comment:    ==================================================================== TOXASSURE SELECT 13 (MW) ==================================================================== Test  Result       Flag       Units Drug Present and Declared for Prescription Verification   Oxycodone                      754          EXPECTED   ng/mg creat   Oxymorphone                    231          EXPECTED   ng/mg creat   Noroxycodone                    2463         EXPECTED   ng/mg creat   Noroxymorphone                 89           EXPECTED   ng/mg creat    Sources of oxycodone are scheduled prescription medications.    Oxymorphone, noroxycodone, and noroxymorphone are expected    metabolites of oxycodone. Oxymorphone is also available as a    scheduled prescription medication. Drug Present not Declared for Prescription Verification   Fentanyl                       4            UNEXPECTED ng/mg creat   Norfentanyl                    29           UNEXPECTED ng/mg creat    Source of fentanyl is a scheduled prescription medication,    including IV, patch, and transmucosal formulations. Norfentanyl    is an expected metabolite of fentanyl. ==================================================================== Test                      Result    Flag   Units      Ref Range   Creatinine              134              mg/dL      >=20 ==================================================================== Declared Medications:  The flagging and interpretation on this report are based on the  following declared medications.  Unexpected results may arise from  inaccuracies in the declared medications.  **Note: The testing scope of this panel includes these medications:  Oxycodone  **Note: The testing scope of this panel does not include following  reported medications:  Aspirin (Aspirin 81)  Bupropion (Wellbutrin)  Chondroitin (Glucosamine-Chondroitin)  Cinnamon  Glipizide (Glucotrol)  Glucosamine (Glucosamine-Chondroitin)  Magnesium  Multivitamin  Naloxone  Nitroglycerin (Nitrostat)  Paroxetine  Polyethylene Glycol (GlycoLAX)  Polyethylene Glycol (MiraLAX)  Pregabalin (Lyrica)  Rosuvastatin (Crestor)  Supplement (Omega-3)  Tizanidine (Zanaflex)  Turmeric  Ubiquinone (Coenzyme Q 10)  Vitamin D2 (Drisdol)  Vitamin E ==================================================================== For clinical consultation, please  call [phone]. ====================================================================     Intrathecal Pump Therapy Assessment  Manufacturer: Medtronic Synchromed Type: Programmable Volume: 40 mL reservoir MRI compatibility: Yes   Drug content:  Primary Medication Class:Opioid Primary Medication:PF-Fentanyl(1023mcg/mL) Secondary Medication:PF-Bupivacaine(20mg /mL) Other Medication:PF-Baclofen (265mcg/mL)   Programming:  Type: Simple continuous. See pump readout for details.   Changes:  Medication Change: None at this point Rate Change: No change in rate  Reported side-effects or adverse reactions: None reported  Effectiveness:  Described as relatively effective, allowing for increase in activities of daily living (ADL) Clinically meaningful improvement in function (CMIF): Sustained CMIF goals met  Plan: Pump refill today  Pre-op H&P Assessment:  Melinda [name] is a 65 y.o. (year old), female patient, seen today for interventional treatment. 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 Melinda [name] has a current medication  list which includes the following prescription(s): accu-chek aviva plus, accu-chek softclix lancets, AMBULATORY NON FORMULARY MEDICATION, vitamin c, aspirin, b complex vitamins, bupropion, calcium-magnesium-zinc, cinnamon, fluticasone, glucosamine-chondroitin, magnesium, naloxone, nitroglycerin, NONFORMULARY OR COMPOUNDED ITEM, fish oil, OVER THE COUNTER MEDICATION, oxycodone-acetaminophen, paroxetine, polyethylene glycol, pregabalin, rosuvastatin, tizanidine, vitamin e, biotin, accu-chek guide control, vitamin d, accu-chek guide, and oxycodone-acetaminophen. Her primarily concern today is the Back Pain (low)  Initial Vital Signs:  Pulse/HCG Rate: 80  Temp: (!) 97.3 F (36.3 C) Resp: 18 BP: 99/83 SpO2: 97 %  BMI: Estimated body mass index is 33.97 kg/m as calculated from the following:   Height as of this encounter: $RemoveBeforeD'5\' 9"'axLxtqFKKDNpGw$  (1.753 m).   Weight as of this encounter: 230 lb (104.3 kg).  Risk Assessment: Allergies: Reviewed. She has No Known Allergies.  Allergy Precautions: None required Coagulopathies: Reviewed. None identified.  Blood-thinner therapy: None at this time Active Infection(s): Reviewed. None identified. Melinda [name] is afebrile  Site Confirmation: Melinda [name] was asked to confirm the procedure and laterality before marking the site Procedure checklist: Completed Consent: Before the procedure and under the influence of no sedative(s), amnesic(s), or anxiolytics, the patient was informed of the treatment options, risks and possible complications. To fulfill our ethical and legal obligations, as recommended by the American Medical Association's Code of Ethics, I have informed the patient of my clinical impression; the nature and purpose of the treatment or procedure; the risks, benefits, and possible complications of the intervention; the alternatives, including doing nothing; the risk(s) and benefit(s) of the alternative treatment(s) or procedure(s); and the risk(s) and benefit(s) of  doing nothing.  Melinda [name] was provided with information about the general risks and possible complications associated with most interventional procedures. These include, but are not limited to: failure to achieve desired goals, infection, bleeding, organ or nerve damage, allergic reactions, paralysis, and/or death.  In addition, she was informed of those risks and possible complications associated to this particular procedure, which include, but are not limited to: damage to the implant; failure to decrease pain; local, systemic, or serious CNS infections, intraspinal abscess with possible cord compression and paralysis, or life-threatening such as meningitis; bleeding; organ damage; nerve injury or damage with subsequent sensory, motor, and/or autonomic system dysfunction, resulting in transient or permanent pain, numbness, and/or weakness of one or several areas of the body; allergic reactions, either minor or major life-threatening, such as anaphylactic or anaphylactoid reactions.  Furthermore, Melinda [name] was informed of those risks and complications associated with the medications. These include, but are not  limited to: allergic reactions (i.e.: anaphylactic or anaphylactoid reactions); endorphine suppression; bradycardia and/or hypotension; water retention and/or peripheral vascular relaxation leading to lower extremity edema and possible stasis ulcers; respiratory depression and/or shortness of breath; decreased metabolic rate leading to weight gain; swelling or edema; medication-induced neural toxicity; particulate matter embolism and blood vessel occlusion with resultant organ, and/or nervous system infarction; and/or intrathecal granuloma formation with possible spinal cord compression and permanent paralysis.  Before refilling the pump Melinda [name] was informed that some of the medications used in the devise may not be FDA approved for such use and therefore it constitutes an off-label use of the  medications.  Finally, she was informed that Medicine is not an exact science; therefore, there is also the possibility of unforeseen or unpredictable risks and/or possible complications that may result in a catastrophic outcome. The patient indicated having understood very clearly. We have given the patient no guarantees and we have made no promises. Enough time was given to the patient to ask questions, all of which were answered to the patient's satisfaction. Melinda [name] has indicated that she wanted to continue with the procedure. Attestation: I, the ordering provider, attest that I have discussed with the patient the benefits, risks, side-effects, alternatives, likelihood of achieving goals, and potential problems during recovery for the procedure that I have provided informed consent. Date  Time: 08/23/2020  1:14 PM  Pre-Procedure Preparation:  Monitoring: As per clinic protocol. Respiration, ETCO2, SpO2, BP, heart rate and rhythm monitor placed and checked for adequate function Safety Precautions: Patient was assessed for positional comfort and pressure points before starting the procedure. Time-out: I initiated and conducted the "Time-out" before starting the procedure, as per protocol. The patient was asked to participate by confirming the accuracy of the "Time Out" information. Verification of the correct person, site, and procedure were performed and confirmed by me, the nursing staff, and the patient. "Time-out" conducted as per Joint Commission's Universal Protocol (UP.01.01.01). Time: 1321  Description of Procedure:          Position: Supine Target Area: Central-port of intrathecal pump. Approach: Anterior, 90 degree angle approach. Area Prepped: Entire Area around the pump implant. DuraPrep (Iodine Povacrylex [0.7% available iodine] and Isopropyl Alcohol, 74% w/w) Safety Precautions: Aspiration looking for blood return was conducted prior to all injections. At no point did we inject  any substances, as a needle was being advanced. No attempts were made at seeking any paresthesias. Safe injection practices and needle disposal techniques used. Medications properly checked for expiration dates. SDV (single dose vial) medications used. Description of the Procedure: Protocol guidelines were followed. Two nurses trained to do implant refills were present during the entire procedure. The refill medication was checked by both healthcare providers as well as the patient. The patient was included in the "Time-out" to verify the medication. The patient was placed in position. The pump was identified. The area was prepped in the usual manner. The sterile template was positioned over the pump, making sure the side-port location matched that of the pump. Both, the pump and the template were held for stability. The needle provided in the Medtronic Kit was then introduced thru the center of the template and into the central port. The pump content was aspirated and discarded volume documented. The new medication was slowly infused into the pump, thru the filter, making sure to avoid overpressure of the device. The needle was then removed and the area cleansed, making sure to leave some of the prepping solution back  to take advantage of its long term bactericidal properties. The pump was interrogated and programmed to reflect the correct medication, volume, and dosage. The program was printed and taken to the physician for approval. Once checked and signed by the physician, a copy was provided to the patient and another scanned into the EMR. Vitals:   08/23/20 1313 08/23/20 1314  BP:  99/83  Pulse:  80  Resp:  18  Temp:  (!) 97.3 F (36.3 C)  SpO2:  97%  Weight: 230 lb (104.3 kg)   Height: $Remove'5\' 9"'oDXCZBL$  (1.753 m)     Start Time: 1400 hrs. End Time: 1412 hrs. Materials & Medications: Medtronic Refill Kit Medication(s): Please see chart orders for details.  Imaging Guidance:          Type of Imaging  Technique: None used Indication(s): N/A Exposure Time: No patient exposure Contrast: None used. Fluoroscopic Guidance: N/A Ultrasound Guidance: N/A Interpretation: N/A  Antibiotic Prophylaxis:   Anti-infectives (From admission, onward)   None     Indication(s): None identified  Post-operative Assessment:  Post-procedure Vital Signs:  Pulse/HCG Rate: 80  Temp: (!) 97.3 F (36.3 C) Resp: 18 BP: 99/83 SpO2: 97 %  EBL: None  Complications: No immediate post-treatment complications observed by team, or reported by patient.  Note: The patient tolerated the entire procedure well. A repeat set of vitals were taken after the procedure and the patient was kept under observation following institutional policy, for this type of procedure. Post-procedural neurological assessment was performed, showing return to baseline, prior to discharge. The patient was provided with post-procedure discharge instructions, including a section on how to identify potential problems. Should any problems arise concerning this procedure, the patient was given instructions to immediately contact us, at any time, without hesitation. In any case, we plan to contact the patient by telephone for a follow-up status report regarding this interventional procedure.  Comments:  No additional relevant information.  Plan of Care  Orders:  Orders Placed This Encounter  Procedures  . PUMP REFILL    Maintain Protocol by having two(2) healthcare providers during procedure and programming.    Scheduling Instructions:     Please refill intrathecal pump today.    Order Specific Question:   Where will this procedure be performed?    Answer:   [facility]  . PUMP REFILL    Whenever possible schedule on a procedure today.    Standing Status:   Future    Standing Expiration Date:   01/20/2021    Scheduling Instructions:     Please schedule intrathecal pump refill based on pump programming. Avoid schedule intervals  of more than 120 days (4 months).    Order Specific Question:   Where will this procedure be performed?    Answer:   [facility]  . Remove and insert drug implant    Standing Status:   Future    Standing Expiration Date:   02/20/2021    Scheduling Instructions:     Please schedule an intrathecal pump implant in same day surgery. Call the Medtronic representative and schedule them to be available in the OR for procedure.  . Informed Consent Details: Physician/Practitioner Attestation; Transcribe to consent form and obtain patient signature    Transcribe to consent form and obtain patient signature.    Order Specific Question:   Physician/Practitioner attestation of informed consent for procedure/surgical case    Answer:   I, the physician/practitioner, attest that I have discussed with the patient the  benefits, risks, side effects, alternatives, likelihood of achieving goals and potential problems during recovery for the procedure that I have provided informed consent.    Order Specific Question:   Procedure    Answer:   Intrathecal pump refill    Order Specific Question:   Physician/Practitioner performing the procedure    Answer:   Attending Physician: Kathlen Brunswick. Dossie Arbour, MD & designated trained staff    Order Specific Question:   Indication/Reason    Answer:   Chronic Pain Syndrome (G89.4), presence of an intrathecal pump (Z97.8)   Chronic Opioid Analgesic:  Oxycodone 5 mg, 1 tab PO q 8 hrs (15 mg/day of oxycodone) + Intrathecal PF-Fentanyl MME/day: 22.5 mg/day (oral).   Medications ordered for procedure: Meds ordered this encounter  Medications  . DISCONTD: oxyCODONE-acetaminophen (PERCOCET) 5-325 MG tablet    Sig: Take 1 tablet by mouth every 8 (eight) hours as needed for severe pain.    Dispense:  90 tablet    Refill:  0    Chronic Pain: STOP Act (Not applicable) Fill 1 day early if closed on refill date. Avoid benzodiazepines within 8 hours of opioids  .  oxyCODONE-acetaminophen (PERCOCET) 5-325 MG tablet    Sig: Take 1 tablet by mouth every 8 (eight) hours as needed for severe pain.    Dispense:  90 tablet    Refill:  0    Chronic Pain: STOP Act (Not applicable) Fill 1 day early if closed on refill date. Avoid benzodiazepines within 8 hours of opioids  . DISCONTD: oxyCODONE-acetaminophen (PERCOCET) 5-325 MG tablet    Sig: Take 1 tablet by mouth every 8 (eight) hours as needed for severe pain.    Dispense:  90 tablet    Refill:  0    Chronic Pain: STOP Act (Not applicable) Fill 1 day early if closed on refill date. Avoid benzodiazepines within 8 hours of opioids   Medications administered: Melinda [name] had no medications administered during this visit.  See the medical record for exact dosing, route, and time of administration.  Follow-up plan:   Return for Pump Refill (Max:47mo), in addition schedule in OR for pump pocket revision and anchoring.       Interventional Therapies  Risk  Complexity Considerations:   WNL   Planned  Pending:   Intrathecal pump pocket revision and reanchoring of device.   Under consideration:   Possible left lumbar facet RFA  Possible right lumbar facet RFA  Diagnostic caudal ESI + diagnostic epidurogram Possible Racz procedure Diagnostic bilateral IA knee injection (Steroid) Diagnostic bilateral genicular NB Possible bilateral genicular nerve RFA Possible bilateral Hyalgan knee injection DiagnosticcervicalESI Diagnostic bilateral cervical facet block Possible bilateral cervical facet RFA   Completed:   Diagnostic/therapeutic left L2-3 LESI x1 Diagnostic/therapeutic right lumbar facet block x3  Diagnostic/therapeutic left lumbar facet block x1    Therapeutic  Palliative (PRN) options:   Palliative/therapeutic intrathecal pump management (refills/programming adjustments)  Palliative left L2-3 LESI #2 Palliative right lumbar facet block #4  Diagnostic left lumbar facet block #2      Recent Visits Date Type Provider Dept  08/23/20 Procedure visit [name], MD [facility]  07/18/20 Telemedicine [name], MD [facility]  Showing recent visits within past 90 days and meeting all other requirements Future Appointments Date Type Provider Dept  11/22/20 Appointment [name], MD [facility]  Showing future appointments within next 90 days and meeting all other requirements  Disposition: Discharge home  Discharge (Date  Time): 08/23/2020; 1431  hrs.   Primary Care Physician: Jearld Fenton, NP Location: [facility] Note by: Gaspar Cola, MD Date: 08/23/2020; Time: 5:50 AM  Disclaimer:  Medicine is not an Chief Strategy Officer. The only guarantee in medicine is that nothing is guaranteed. It is important to note that the decision to proceed with this intervention was based on the information collected from the patient. The Data and conclusions were drawn from the patient's questionnaire, the interview, and the physical examination. Because the information was provided in large part by the patient, it cannot be guaranteed that it has not been purposely or unconsciously manipulated. Every effort has been made to obtain as much relevant data as possible for this evaluation. It is important to note that the conclusions that lead to this procedure are derived in large part from the available data. Always take into account that the treatment will also be dependent on availability of resources and existing treatment guidelines, considered by other Pain Management Practitioners as being common knowledge and practice, at the time of the intervention. For Medico-Legal purposes, it is also important to point out that variation in procedural techniques and pharmacological choices are the acceptable norm. The indications, contraindications, technique, and results of the above procedure should only be  interpreted and judged by a Board-Certified Interventional Pain Specialist with extensive familiarity and expertise in the same exact procedure and technique.

## 2020-08-24 ENCOUNTER — Telehealth: Payer: Self-pay

## 2020-08-24 NOTE — Telephone Encounter (Signed)
Called post IT pump. No answer, left message on answer machine to call if needed.

## 2020-08-25 ENCOUNTER — Encounter: Payer: Self-pay | Admitting: Internal Medicine

## 2020-08-25 ENCOUNTER — Telehealth: Payer: Self-pay | Admitting: *Deleted

## 2020-08-25 ENCOUNTER — Ambulatory Visit (INDEPENDENT_AMBULATORY_CARE_PROVIDER_SITE_OTHER): Payer: Medicare Other | Admitting: Internal Medicine

## 2020-08-25 ENCOUNTER — Other Ambulatory Visit: Payer: Self-pay | Admitting: Orthopaedic Surgery

## 2020-08-25 ENCOUNTER — Other Ambulatory Visit: Payer: Self-pay

## 2020-08-25 DIAGNOSIS — Z01818 Encounter for other preprocedural examination: Secondary | ICD-10-CM

## 2020-08-25 DIAGNOSIS — M7989 Other specified soft tissue disorders: Secondary | ICD-10-CM | POA: Diagnosis not present

## 2020-08-25 NOTE — Progress Notes (Signed)
Pt. Needs orders for surgery. PAT and labs: 08/26/20.Thanks.

## 2020-08-25 NOTE — Assessment & Plan Note (Signed)
Seems likely to be related to the problems with TKR Had vascular study on 2/14--negative for DVT No Baker's cyst  Told her she should plan to go ahead with the revision planned 3/8  May help the current swelling eventually No DVT Would use typical DVT prevention post op  Try heat and continue elevation

## 2020-08-25 NOTE — Telephone Encounter (Signed)
   Primary Cardiologist: Candee Furbish, MD  Chart reviewed as part of pre-operative protocol coverage. Patient was contacted 08/25/2020 in reference to pre-operative risk assessment for pending surgery as outlined below.  Melinda [name] was last seen on 05/13/2020 by Dr. Marlou Porch.  Since that day, Melinda [name] has done well without exertional chest pain or worsening shortness of breath.  She has not had any recent drop in the blood pressure.  She may hold aspirin for 5 to 7 days prior to the procedure and restart as soon as possible after the procedure at the surgeon's discretion.  Therefore, based on ACC/AHA guidelines, the patient would be at acceptable risk for the planned procedure without further cardiovascular testing.   The patient was advised that if she develops new symptoms prior to surgery to contact our office to arrange for a follow-up visit, and she verbalized understanding.  I will route this recommendation to the requesting party via Epic fax function and remove from pre-op pool. Please call with questions.    [place], [name] 08/25/2020, 1:46 PM

## 2020-08-25 NOTE — Progress Notes (Signed)
Subjective:    Patient ID: Melinda [name], female    DOB: 04/10/1956, 65 y.o.   MRN: [id number]  HPI Here due to right leg swelling This visit occurred during the SARS-CoV-2 public health emergency.  Safety protocols were in place, including screening questions prior to the visit, additional usage of staff PPE, and extensive cleaning of exam room while observing appropriate contact time as indicated for disinfecting solutions.   Had her TKR Was in therapy and doing okay Then in routine check up---noted swelling and hardness in upper posterior calf Then it moved down the calf Evaluated for DVT---was negative She feels the "tissues are infiltrated"  Due for repeat surgery now--for larger spacer Knee will sublux with stress on lateral knee  Feels warm to touch in upper calf If on feet for a while---notices it more Has tried hinged brace---but tough to wear due to the swelling  Current Outpatient Medications on File Prior to Visit  Medication Sig Dispense Refill  . ACCU-CHEK AVIVA PLUS test strip USE TO CHECK BLOOD SUGAR ONE TIME A DAY 100 strip 5  . Accu-Chek Softclix Lancets lancets USE TO CHECK BLOOD SUGAR 1 TIME DAILY 100 each 2  . AMBULATORY NON FORMULARY MEDICATION Medication Name: CPAP MASK OF CHOICE FOR HOME DEVICE 1 each 0  . Ascorbic Acid (VITAMIN C) 1000 MG tablet Take 1,000 mg by mouth daily.    Marland Kitchen aspirin 81 MG tablet Take 1 tablet (81 mg total) by mouth 2 (two) times daily after a meal. (Patient taking differently: Take 81 mg by mouth daily.) 30 tablet 0  . b complex vitamins tablet Take 1 tablet by mouth in the morning.    . Biotin 10000 MCG TABS Take 10,000 mcg by mouth daily.    Marland Kitchen buPROPion (WELLBUTRIN XL) 150 MG 24 hr tablet TAKE 1 TABLET BY MOUTH EVERY DAY (Patient taking differently: Take 150 mg by mouth daily.) 90 tablet 2  . CALCIUM-MAGNESIUM-ZINC PO Take 3 capsules by mouth daily.    . Cholecalciferol (VITAMIN D) 50 MCG (2000 UT) tablet Take 2,000 Units by mouth  daily.    Marland Kitchen CINNAMON PO Take 1,000 mg by mouth daily.    . fluticasone (FLONASE) 50 MCG/ACT nasal spray Place 2 sprays into both nostrils daily as needed for allergies.     Marland Kitchen glucosamine-chondroitin 500-400 MG tablet Take 2 tablets by mouth daily.     . Magnesium 400 MG CAPS Take 400 mg by mouth at bedtime.    . naloxone (NARCAN) 2 MG/2ML injection Inject 1 mL (1 mg total) into the muscle as needed for up to 2 doses (for opioid overdose). In case of emergency (overdose), inject into muscle of upper arm or leg and call 911. 2 mL 0  . nitroGLYCERIN (NITROSTAT) 0.4 MG SL tablet Place 1 tablet (0.4 mg total) under the tongue every 5 (five) minutes as needed for chest pain. Please keep upcoming appt in October. Thank you 25 tablet 1  . NONFORMULARY OR COMPOUNDED ITEM 212.8 mcg by Epidural Infusion route. Medtronic Neuromodulation pump  Fentanyl 1,000.0 mcg/ml Baclofen 240.0 mcg/ml Bupivacaine 20.0 mg/ml Total daily dose 269.6 mcg/day    . Omega-3 Fatty Acids (FISH OIL) 1200 MG CAPS Take 1,200 mg by mouth in the morning.     Marland Kitchen OVER THE COUNTER MEDICATION Take 1 capsule by mouth daily. Schigandra Plus    . [START ON 08/26/2020] oxyCODONE-acetaminophen (PERCOCET) 5-325 MG tablet Take 1 tablet by mouth every 8 (eight) hours as needed for severe  pain. 90 tablet 0  . [START ON 09/25/2020] oxyCODONE-acetaminophen (PERCOCET) 5-325 MG tablet Take 1 tablet by mouth every 8 (eight) hours as needed for severe pain. 90 tablet 0  . [START ON 10/25/2020] oxyCODONE-acetaminophen (PERCOCET) 5-325 MG tablet Take 1 tablet by mouth every 8 (eight) hours as needed for severe pain. 90 tablet 0  . PARoxetine (PAXIL) 20 MG tablet TAKE 0.5 TABLET BY MOUTH DAILY IN THE MORNING (Patient taking differently: Take 10 mg by mouth daily. TAKE 0.5 TABLET BY MOUTH DAILY IN THE MORNING) 45 tablet 1  . polyethylene glycol (MIRALAX / GLYCOLAX) packet Take 17 g by mouth daily.    . pregabalin (LYRICA) 150 MG capsule Take 1 capsule (150 mg  total) by mouth 3 (three) times daily. 90 capsule 2  . rosuvastatin (CRESTOR) 20 MG tablet TAKE 1 TABLET BY MOUTH EVERY DAY (Patient taking differently: Take 20 mg by mouth daily.) 90 tablet 3  . tiZANidine (ZANAFLEX) 4 MG tablet Take 1 tablet (4 mg total) by mouth every 8 (eight) hours as needed for muscle spasms. 90 tablet 2  . vitamin E 180 MG (400 UNITS) capsule Take 400 Units by mouth daily.     No current facility-administered medications on file prior to visit.    No Known Allergies  Past Medical History:  Diagnosis Date  . Amputation of great toe, right, traumatic ([place]) 05/30/2010  . Amputation of second toe, right, traumatic ([place]) 09/2017  . Anginal pain ([place])   . Anxiety   . Blue toes    2nd toe on right foot, will get appt.  . Bulging disc   . CAD (coronary artery disease) 2009   s/p stent to LAD  . Cardiac arrhythmia due to congenital heart disease 1992   WPW.  Ablations done.  Now has rare episodes  . Chicken pox   . Chronic fatigue   . Chronic kidney disease    "problem with kidney filtration"  . Coronary arteritis   . Degenerative disc disease    Back, neck, hands, knees  . Depression   . Diabetes mellitus without complication ([place])   . Facet joint disease   . Fibromyalgia   . Heart disease   . Hyperlipidemia   . IBS (irritable bowel syndrome)   . MCL deficiency, knee   . MRSA (methicillin resistant staph aureus) culture positive 2011   GREAT TOE RIGHT FOOT  . Neuropathy 04/18/2010  . Orthostatic hypotension 01/2020  . Peripheral neuropathy   . Renal insufficiency   . Restless leg syndrome   . Sepsis ([place]) 09/2013  . Sleep apnea 08/17/2003   uses CPAP, sleep study at [facility] (mild to moderate)  . Spinal stenosis     Past Surgical History:  Procedure Laterality Date  . ABLATION     UTERUS  . ABLATION     HEART  . AMPUTATION TOE Right 02/01/2017   Procedure: AMPUTATION TOE-RIGHT 2ND MPJ;  Surgeon: Albertine Patricia, DPM;  Location: [facility]  ORS;  Service: Podiatry;  Laterality: Right;  . ANTERIOR CERVICAL DECOMP/DISCECTOMY FUSION N/A 07/28/2014   Procedure: ANTERIOR CERVICAL DECOMPRESSION/DISCECTOMY FUSION CERVICAL 3-4,4-5,5-6 LEVELS WITH INSTRUMENTATION AND ALLOGRAFT;  Surgeon: Sinclair Ship, MD;  Location: [place];  Service: Orthopedics;  Laterality: N/A;  Anterior cervical decompression fusion, cervical 3-4, cervical 4-5, cervical 5-6 with instrumentation and allograft  . BACK SURGERY     X 3 1979, 1994, 1995  . CARPAL TUNNEL RELEASE Right   . CHOLECYSTECTOMY  2003  . CORONARY ANGIOPLASTY  2008  . EYE SURGERY Bilateral 2013   Eyelid lift   . FOOT SURGERY Right    BIG TOE  . FOOT SURGERY Bilateral    PLANTAR FASCIITIS  . FOOT SURGERY Right    2ND TOE  . GALLBLADDER SURGERY    . HAMMER TOE SURGERY Right 10/17/2017   Procedure: HAMMER TOE CORRECTION-4TH TOE;  Surgeon: Albertine Patricia, DPM;  Location: [place];  Service: Podiatry;  Laterality: Right;  LMA- WITH LOCAL Diabetic - diet controlled  . HAND SURGERY Left   . HAND SURGEY Left   . HEART STENT  2009   LAD  . INFUSION PUMP IMPLANTATION     X2 with morphine and baclofen  . INTRATHECAL PUMP REVISION N/A 04/25/2018   Procedure: Intrathecal pump replacement;  Surgeon: Clydell Hakim, MD;  Location: [place];  Service: Neurosurgery;  Laterality: N/A;  right  . INTRATHECAL PUMP REVISION Right 04/25/2018  . INTRATHECAL PUMP REVISION N/A 12/12/2018   Procedure: Intrathecal pump revision with exploration of pocket;  Surgeon: Clydell Hakim, MD;  Location: [place];  Service: Neurosurgery;  Laterality: N/A;  Intrathecal pump revision with exploration of pocket  . INTRATHECAL PUMP REVISION N/A 03/11/2020   Procedure: INTRATHECAL PUMP REVISION;  Surgeon: [name], MD;  Location: [facility] ORS;  Service: Neurosurgery;  Laterality: N/A;  . IRRIGATION AND DEBRIDEMENT FOOT Right 02/23/2017   Procedure: IRRIGATION AND DEBRIDEMENT FOOT-right foot;  Surgeon: Samara Deist, DPM;  Location: [facility] ORS;  Service: Podiatry;  Laterality: Right;  . KNEE SURGERY Right 05/24/2020  . PAIN PUMP REVISION N/A 08/15/2018   Procedure: Intrathecal PUMP REVISION;  Surgeon: Clydell Hakim, MD;  Location: [place];  Service: Neurosurgery;  Laterality: N/A;  INTRATHECAL PUMP REVISION  . TOE SURGERY     then revision 8/18  . TOTAL KNEE ARTHROPLASTY Right 05/24/2020   Procedure: RIGHT TOTAL KNEE ARTHROPLASTY;  Surgeon: Melrose Nakayama, MD;  Location: [facility] ORS;  Service: Orthopedics;  Laterality: Right;    Family History  Problem Relation Age of Onset  . Lung cancer Mother   . Diabetes Father   . Heart disease Maternal Grandfather   . Diabetes Paternal Grandmother   . Colon cancer Paternal Grandfather   . Stroke Neg Hx     Social History   Socioeconomic History  . Marital status: Significant Other    Spouse name: Not on file  . Number of children: Not on file  . Years of education: Not on file  . Highest education level: Not on file  Occupational History  . Not on file  Tobacco Use  . Smoking status: Former Smoker    Packs/day: 1.50    Years: 32.00    Pack years: 48.00    Types: Cigarettes    Quit date: 07/22/2007    Years since quitting: 13.1  . Smokeless tobacco: Never Used  Vaping Use  . Vaping Use: Never used  Substance and Sexual Activity  . Alcohol use: Yes    Alcohol/week: 0.0 standard drinks    Comment: occ - Holidays  . Drug use: Yes    Comment: prescribed pain pump and oxy  . Sexual activity: Yes  Other Topics Concern  . Not on file  Social History Narrative   Lives at home with a partner. Independent at baseline   Social Determinants of Health   Financial Resource Strain: Not on file  Food Insecurity: Not on file  Transportation Needs: Not on file  Physical Activity: Not on file  Stress: Not on  file  Social Connections: Not on file  Intimate Partner Violence: Not on file   Review of Systems  No trauma to knee or leg No fever--doesn't  feel sick No chest pain or SOB    Objective:   Physical Exam Musculoskeletal:     Comments: Firm upper calf swelling on right Slight tenderness Fullness of distal calf as well  Left is fairly normal  No obvious fluid around right knee No mass behind knee            Assessment & Plan:

## 2020-08-25 NOTE — Telephone Encounter (Signed)
   [facility] Pre-operative Risk Assessment    HEARTCARE STAFF: - Please ensure there is not already an duplicate clearance open for this procedure. - Under Visit Info/Reason for Call, type in Other and utilize the format Clearance MM/DD/YY or Clearance TBD. Do not use dashes or single digits. - If request is for dental extraction, please clarify the # of teeth to be extracted.  Request for surgical clearance: RECORDS REQUESTED AS WELL FOR RECENT OV NOTE, LABS, EKG AND SPECIAL STUDIES; WILL HAND OVER REQUEST TO MEDICAL RECORDS DEPT  1. What type of surgery is being performed? REVISION RIGHT KNEE ARTHROPLASTY   2. When is this surgery scheduled? 09/06/20   3. What type of clearance is required (medical clearance vs. Pharmacy clearance to hold med vs. Both)? MEDICAL  4. Are there any medications that need to be held prior to surgery and how long? ASA    5. Practice name and name of physician performing surgery? [facility]; DR. Collier Salina DALLDORF   6. What is the office phone number? [phone]   7.   What is the office fax number? [phone] ATTN: [place]  8.   Anesthesia type (None, local, MAC, general) ? SPINAL   Julaine Hua 08/25/2020, 1:21 PM  _________________________________________________________________   (provider comments below)

## 2020-08-25 NOTE — Patient Instructions (Addendum)
DUE TO COVID-19 ONLY ONE VISITOR IS ALLOWED TO COME WITH YOU AND STAY IN THE WAITING ROOM ONLY DURING PRE OP AND PROCEDURE DAY OF SURGERY. THE 1 VISITOR  MAY VISIT WITH YOU AFTER SURGERY IN YOUR PRIVATE ROOM DURING VISITING HOURS ONLY!  YOU NEED TO HAVE A COVID 19 TEST ON: 09/02/20 @ 2:30 PM , THIS TEST MUST BE DONE BEFORE SURGERY,  COVID TESTING SITE [place] [id number], IT IS ON THE RIGHT GOING OUT [address] APPROXIMATELY  2 MINUTES PAST [employer] ON THE RIGHT. ONCE YOUR COVID TEST IS COMPLETED,  PLEASE BEGIN THE QUARANTINE INSTRUCTIONS AS OUTLINED IN YOUR HANDOUT.                Melinda [name]   Your procedure is scheduled on: 09/06/20   Report to [facility] Main  Entrance   Report to admitting at: 12:00 PM     Call this number if you have problems the morning of surgery [phone]    Remember: Do not eat solid food :After Midnight. Clear liquids until: 11:30 am.  CLEAR LIQUID DIET  Foods Allowed                                                                     Foods Excluded  Coffee and tea, regular and decaf                             liquids that you cannot  Plain Jell-O any favor except red or purple                                           see through such as: Fruit ices (not with fruit pulp)                                     milk, soups, orange juice  Iced Popsicles                                    All solid food Carbonated beverages, regular and diet                                    Cranberry, grape and apple juices Sports drinks like Gatorade Lightly seasoned clear broth or consume(fat free) Sugar, honey syrup  Sample Menu Breakfast                                Lunch                                     Supper Cranberry juice  Beef broth                            Chicken broth Jell-O                                     Grape juice                           Apple juice Coffee or tea                         Jell-O                                      Popsicle                                                Coffee or tea                        Coffee or tea  _____________________________________________________________________  BRUSH YOUR TEETH MORNING OF SURGERY AND RINSE YOUR MOUTH OUT, NO CHEWING GUM CANDY OR MINTS.    Take these medicines the morning of surgery with A SIP OF WATER: bupropion,pregabalin,paxil.Use flonase as usual.  DO NOT TAKE ANY DIABETIC MEDICATIONS DAY OF YOUR SURGERY                               You may not have any metal on your body including hair pins and              piercings  Do not wear jewelry, make-up, lotions, powders or perfumes, deodorant             Do not wear nail polish on your fingernails.  Do not shave  48 hours prior to surgery.    Do not bring valuables to the hospital. [place].  Contacts, dentures or bridgework may not be worn into surgery.  Leave suitcase in the car. After surgery it may be brought to your room.     Patients discharged the day of surgery will not be allowed to drive home. IF YOU ARE HAVING SURGERY AND GOING HOME THE SAME DAY, YOU MUST HAVE AN ADULT TO DRIVE YOU HOME AND BE WITH YOU FOR 24 HOURS. YOU MAY GO HOME BY TAXI OR UBER OR ORTHERWISE, BUT AN ADULT MUST ACCOMPANY YOU HOME AND STAY WITH YOU FOR 24 HOURS.  Name and phone number of your driver:  Special Instructions: N/A              Please read over the following fact sheets you were given: _____________________________________________________________________   PLEASE BRING CPAP MASK [place]. DEVICE WILL BE PROVIDED!        [facility] - Preparing for Surgery Before surgery, you can play an important role.  Because skin is not sterile, your skin needs to be as free of germs as  possible.  You can reduce the number of germs on your skin by washing with CHG (chlorahexidine gluconate) soap before surgery.   CHG is an antiseptic cleaner which kills germs and bonds with the skin to continue killing germs even after washing. Please DO NOT use if you have an allergy to CHG or antibacterial soaps.  If your skin becomes reddened/irritated stop using the CHG and inform your nurse when you arrive at Short Stay. Do not shave (including legs and underarms) for at least 48 hours prior to the first CHG shower.  You may shave your face/neck. Please follow these instructions carefully:  1.  Shower with CHG Soap the night before surgery and the  morning of Surgery.  2.  If you choose to wash your hair, wash your hair first as usual with your  normal  shampoo.  3.  After you shampoo, rinse your hair and body thoroughly to remove the  shampoo.                           4.  Use CHG as you would any other liquid soap.  You can apply chg directly  to the skin and wash                       Gently with a scrungie or clean washcloth.  5.  Apply the CHG Soap to your body ONLY FROM THE NECK DOWN.   Do not use on face/ open                           Wound or open sores. Avoid contact with eyes, ears mouth and genitals (private parts).                       Wash face,  Genitals (private parts) with your normal soap.             6.  Wash thoroughly, paying special attention to the area where your surgery  will be performed.  7.  Thoroughly rinse your body with warm water from the neck down.  8.  DO NOT shower/wash with your normal soap after using and rinsing off  the CHG Soap.                9.  Pat yourself dry with a clean towel.            10.  Wear clean pajamas.            11.  Place clean sheets on your bed the night of your first shower and do not  sleep with pets. Day of Surgery : Do not apply any lotions/deodorants the morning of surgery.  Please wear clean clothes to the hospital/surgery center.  FAILURE TO FOLLOW THESE INSTRUCTIONS MAY RESULT IN THE CANCELLATION OF YOUR SURGERY PATIENT  SIGNATURE_________________________________  NURSE SIGNATURE__________________________________  ________________________________________________________________________

## 2020-08-26 ENCOUNTER — Encounter (HOSPITAL_COMMUNITY): Payer: Self-pay

## 2020-08-26 ENCOUNTER — Other Ambulatory Visit: Payer: Self-pay

## 2020-08-26 ENCOUNTER — Encounter (HOSPITAL_COMMUNITY)
Admission: RE | Admit: 2020-08-26 | Discharge: 2020-08-26 | Disposition: A | Discharge status: Home / Self Care | Payer: Medicare Other | Source: Ambulatory Visit | Attending: Orthopaedic Surgery | Admitting: Orthopaedic Surgery

## 2020-08-26 DIAGNOSIS — I509 Heart failure, unspecified: Secondary | ICD-10-CM | POA: Insufficient documentation

## 2020-08-26 DIAGNOSIS — Z01812 Encounter for preprocedural laboratory examination: Secondary | ICD-10-CM | POA: Diagnosis not present

## 2020-08-26 DIAGNOSIS — Z87891 Personal history of nicotine dependence: Secondary | ICD-10-CM | POA: Insufficient documentation

## 2020-08-26 DIAGNOSIS — I2510 Atherosclerotic heart disease of native coronary artery without angina pectoris: Secondary | ICD-10-CM | POA: Insufficient documentation

## 2020-08-26 DIAGNOSIS — T8484XA Pain due to internal orthopedic prosthetic devices, implants and grafts, initial encounter: Secondary | ICD-10-CM | POA: Diagnosis not present

## 2020-08-26 DIAGNOSIS — M25561 Pain in right knee: Secondary | ICD-10-CM | POA: Insufficient documentation

## 2020-08-26 DIAGNOSIS — E1122 Type 2 diabetes mellitus with diabetic chronic kidney disease: Secondary | ICD-10-CM | POA: Diagnosis not present

## 2020-08-26 DIAGNOSIS — G4730 Sleep apnea, unspecified: Secondary | ICD-10-CM | POA: Diagnosis not present

## 2020-08-26 DIAGNOSIS — N189 Chronic kidney disease, unspecified: Secondary | ICD-10-CM | POA: Diagnosis not present

## 2020-08-26 DIAGNOSIS — Z7982 Long term (current) use of aspirin: Secondary | ICD-10-CM | POA: Diagnosis not present

## 2020-08-26 DIAGNOSIS — Z96651 Presence of right artificial knee joint: Secondary | ICD-10-CM | POA: Diagnosis not present

## 2020-08-26 LAB — CBC
HCT: 35.7 % — ABNORMAL LOW (ref 36.0–46.0)
Hemoglobin: 11.2 g/dL — ABNORMAL LOW (ref 12.0–15.0)
MCH: 27.2 pg (ref 26.0–34.0)
MCHC: 31.4 g/dL (ref 30.0–36.0)
MCV: 86.7 fL (ref 80.0–100.0)
Platelets: 186 10*3/uL (ref 150–400)
RBC: 4.12 MIL/uL (ref 3.87–5.11)
RDW: 13.7 % (ref 11.5–15.5)
WBC: 6.8 10*3/uL (ref 4.0–10.5)
nRBC: 0 % (ref 0.0–0.2)

## 2020-08-26 LAB — HEMOGLOBIN A1C
Hgb A1c MFr Bld: 6 % — ABNORMAL HIGH (ref 4.8–5.6)
Mean Plasma Glucose: 125.5 mg/dL

## 2020-08-26 LAB — BASIC METABOLIC PANEL
Anion gap: 8 (ref 5–15)
BUN: 33 mg/dL — ABNORMAL HIGH (ref 8–23)
CO2: 29 mmol/L (ref 22–32)
Calcium: 9 mg/dL (ref 8.9–10.3)
Chloride: 103 mmol/L (ref 98–111)
Creatinine, Ser: 1.13 mg/dL — ABNORMAL HIGH (ref 0.44–1.00)
GFR, Estimated: 55 mL/min — ABNORMAL LOW (ref >60)
Glucose, Bld: 67 mg/dL — ABNORMAL LOW (ref 70–99)
Potassium: 4.6 mmol/L (ref 3.5–5.1)
Sodium: 140 mmol/L (ref 135–145)

## 2020-08-26 LAB — SURGICAL PCR SCREEN
MRSA, PCR: NEGATIVE
Staphylococcus aureus: POSITIVE — AB

## 2020-08-26 LAB — GLUCOSE, CAPILLARY: Glucose-Capillary: 66 mg/dL — ABNORMAL LOW (ref 70–99)

## 2020-08-26 NOTE — Progress Notes (Signed)
PCR: STAPH POSITIVE.

## 2020-08-26 NOTE — Progress Notes (Signed)
COVID Vaccine Completed: Yes Date COVID Vaccine completed: 07/03/20 [place] COVID vaccine manufacturer: [place]     PCP -  [name]: NP Cardiologist - Dr. Candee Furbish. LOV: 05/13/20. [place]: 08/25/20  Chest x-ray -  EKG - 02/11/20 Stress Test -  ECHO - 04/22/19 Cardiac Cath -  Pacemaker/ICD device last checked:  Sleep Study - Yes CPAP - Yes  Fasting Blood Sugar - 150's Checks Blood Sugar ___1__ times a week.  Blood Thinner Instructions: Aspirin Instructions: Can be hold 5-7 days as per Dr. Marlou Porch Last Dose:  Anesthesia review: Hx: CAD,OSA(CPAP),CHF,Arrythmias.  Patient denies shortness of breath, fever, cough and chest pain at PAT appointment   Patient verbalized understanding of instructions that were given to them at the PAT appointment. Patient was also instructed that they will need to review over the PAT instructions again at home before surgery.

## 2020-08-29 NOTE — Telephone Encounter (Signed)
[facility] called about surgical clearance for her upcoming surgery. They are wanting to know if she is still surgical cleared for the upcoming surgery she is having as they have the original one from a couple of months ago. They state another fax was sent in on 08/25/20 Please call to advise Wells Guiles at [facility] at [phone]

## 2020-08-29 NOTE — Progress Notes (Signed)
Anesthesia Chart Review   Case: [id number] Date/Time: 09/06/20 1415   Procedure: RIGHT TOTAL KNEE REVISION (Right Knee)   Anesthesia type: Spinal   Pre-op diagnosis: PAINFUL RIGHT KNEE REPLACEMENT   Location: [facility]OM 05 / [facility]   Surgeons: Melrose Nakayama, MD      DISCUSSION:65 y.o. former smoker with h/o DM II, sleep apnea, CAD (stent to LAD 2009), CHF, CKD, painful right knee replacement scheduled for above procedure 09/06/2020 with Dr. Melrose Nakayama.   Pt experiencing right calf pain and swelling that has recently been evaluated by PCP and orthopedics.  Per PCP note 08/25/2020, "Seems likely to be related to the problems with TKR. Had vascular study on 2/14--negative for DVT. No [place]'s cyst. Told her she should plan to go ahead with the revision planned 3/8. May help the current swelling eventually. No DVT. Would use typical DVT prevention post op. Try heat and continue elevation". Pt advised to contact PCP and surgeon if she developed new or worsening sx.   Per cardiology preoperative evaluation 08/25/2020, "Chart reviewed as part of pre-operative protocol coverage. Patient was contacted 08/25/2020 in reference to pre-operative risk assessment for pending surgery as outlined below.  Melinda [name] was last seen on 05/13/2020 by Dr. Marlou Porch.  Since that day, Melinda [name] has done well without exertional chest pain or worsening shortness of breath.  She has not had any recent drop in the blood pressure.  She may hold aspirin for 5 to 7 days prior to the procedure and restart as soon as possible after the procedure at the surgeon's discretion. Therefore, based on ACC/AHA guidelines, the patient would be at acceptable risk for the planned procedure without further cardiovascular testing."  Anticipate pt can proceed with planned procedure barring acute status change.   VS: BP 117/63   Pulse 68   Temp 37.2 C (Oral)   LMP  (LMP Unknown)   SpO2 93%   PROVIDERS: Jearld Fenton, NP is PCP    Candee Furbish, MD is Cardiologist  LABS: Labs reviewed: Acceptable for surgery. (all labs ordered are listed, but only abnormal results are displayed)  Labs Reviewed  SURGICAL PCR SCREEN - Abnormal; Notable for the following components:      Result Value   Staphylococcus aureus POSITIVE (*)    All other components within normal limits  CBC - Abnormal; Notable for the following components:   Hemoglobin 11.2 (*)    HCT 35.7 (*)    All other components within normal limits  BASIC METABOLIC PANEL - Abnormal; Notable for the following components:   Glucose, Bld 67 (*)    BUN 33 (*)    Creatinine, Ser 1.13 (*)    GFR, Estimated 55 (*)    All other components within normal limits  HEMOGLOBIN A1C - Abnormal; Notable for the following components:   Hgb A1c MFr Bld 6.0 (*)    All other components within normal limits  GLUCOSE, CAPILLARY - Abnormal; Notable for the following components:   Glucose-Capillary 66 (*)    All other components within normal limits     IMAGES:   EKG: 02/11/2020 Rate 97 bpm  Sinus rhythm   CV: Stress Test 06/04/2019   Nuclear stress EF: 62%.  There was no ST segment deviation noted during stress.  The study is normal.  This is a low risk study.  The left ventricular ejection fraction is normal (55-65%).   Normal stress nuclear study with no ischemia or infarction.  Gated ejection fraction 62% with  normal wall motion.  Echo 04/22/2019 IMPRESSIONS    1. Left ventricular ejection fraction, by visual estimation, is 60 to  65%. The left ventricle has normal function. Normal left ventricular size.  There is no left ventricular hypertrophy. Normal wall motion. Normal  diastolic function.  2. Global right ventricle has normal systolic function.The right  ventricular size is normal. No increase in right ventricular wall  thickness.  3. Left atrial size was normal.  4. Right atrial size was normal.  5. The mitral valve is normal in  structure. No evidence of mitral valve  regurgitation. No evidence of mitral stenosis.  6. The tricuspid valve is normal in structure. Tricuspid valve  regurgitation is trivial.  7. The aortic valve is tricuspid Aortic valve regurgitation was not  visualized by color flow Doppler. Structurally normal aortic valve, with  no evidence of sclerosis or stenosis.  8. The tricuspid regurgitant velocity is 2.59 m/s, and with an assumed  right atrial pressure of 8 mmHg, the estimated right ventricular systolic  pressure is mildly elevated at 34.8 mmHg.  9. The inferior vena cava is normal in size with greater than 50%  respiratory variability, suggesting right atrial pressure of 3 mmHg.  Past Medical History:  Diagnosis Date  . Amputation of great toe, right, traumatic ([place]) 05/30/2010  . Amputation of second toe, right, traumatic ([place]) 09/2017  . Anginal pain ([place])   . Anxiety   . Blue toes    2nd toe on right foot, will get appt.  . Bulging disc   . CAD (coronary artery disease) 2009   s/p stent to LAD  . Cardiac arrhythmia due to congenital heart disease 1992   WPW.  Ablations done.  Now has rare episodes  . Chicken pox   . Chronic fatigue   . Chronic kidney disease    "problem with kidney filtration"  . Coronary arteritis   . Degenerative disc disease    Back, neck, hands, knees  . Depression   . Diabetes mellitus without complication ([place])   . Facet joint disease   . Fibromyalgia   . Heart disease   . Hyperlipidemia   . IBS (irritable bowel syndrome)   . MCL deficiency, knee   . MRSA (methicillin resistant staph aureus) culture positive 2011   GREAT TOE RIGHT FOOT  . Neuropathy 04/18/2010  . Orthostatic hypotension 01/2020  . Peripheral neuropathy   . Renal insufficiency   . Restless leg syndrome   . Sepsis ([place]) 09/2013  . Sleep apnea 08/17/2003   uses CPAP, sleep study at [facility] (mild to moderate)  . Spinal stenosis     Past Surgical History:   Procedure Laterality Date  . ABLATION     UTERUS  . ABLATION     HEART  . AMPUTATION TOE Right 02/01/2017   Procedure: AMPUTATION TOE-RIGHT 2ND MPJ;  Surgeon: Albertine Patricia, DPM;  Location: [facility];  Service: Podiatry;  Laterality: Right;  . ANTERIOR CERVICAL DECOMP/DISCECTOMY FUSION N/A 07/28/2014   Procedure: ANTERIOR CERVICAL DECOMPRESSION/DISCECTOMY FUSION CERVICAL 3-4,4-5,5-6 LEVELS WITH INSTRUMENTATION AND ALLOGRAFT;  Surgeon: Sinclair Ship, MD;  Location: [place];  Service: Orthopedics;  Laterality: N/A;  Anterior cervical decompression fusion, cervical 3-4, cervical 4-5, cervical 5-6 with instrumentation and allograft  . BACK SURGERY     X 3 1979, 1994, 1995  . CARPAL TUNNEL RELEASE Right   . CHOLECYSTECTOMY  2003  . CORONARY ANGIOPLASTY  2008  . EYE SURGERY Bilateral 2013   Eyelid lift   .  FOOT SURGERY Right    BIG TOE  . FOOT SURGERY Bilateral    PLANTAR FASCIITIS  . FOOT SURGERY Right    2ND TOE  . GALLBLADDER SURGERY    . HAMMER TOE SURGERY Right 10/17/2017   Procedure: HAMMER TOE CORRECTION-4TH TOE;  Surgeon: Albertine Patricia, DPM;  Location: [place];  Service: Podiatry;  Laterality: Right;  LMA- WITH LOCAL Diabetic - diet controlled  . HAND SURGERY Left   . HAND SURGEY Left   . HEART STENT  2009   LAD  . INFUSION PUMP IMPLANTATION     X2 with morphine and baclofen  . INTRATHECAL PUMP REVISION N/A 04/25/2018   Procedure: Intrathecal pump replacement;  Surgeon: Clydell Hakim, MD;  Location: [place];  Service: Neurosurgery;  Laterality: N/A;  right  . INTRATHECAL PUMP REVISION Right 04/25/2018  . INTRATHECAL PUMP REVISION N/A 12/12/2018   Procedure: Intrathecal pump revision with exploration of pocket;  Surgeon: Clydell Hakim, MD;  Location: [place];  Service: Neurosurgery;  Laterality: N/A;  Intrathecal pump revision with exploration of pocket  . INTRATHECAL PUMP REVISION N/A 03/11/2020   Procedure: INTRATHECAL PUMP REVISION;  Surgeon: [name], MD;  Location: [facility];  Service: Neurosurgery;  Laterality: N/A;  . IRRIGATION AND DEBRIDEMENT FOOT Right 02/23/2017   Procedure: IRRIGATION AND DEBRIDEMENT FOOT-right foot;  Surgeon: Samara Deist, DPM;  Location: [facility];  Service: Podiatry;  Laterality: Right;  . KNEE SURGERY Right 05/24/2020  . PAIN PUMP REVISION N/A 08/15/2018   Procedure: Intrathecal PUMP REVISION;  Surgeon: Clydell Hakim, MD;  Location: [place];  Service: Neurosurgery;  Laterality: N/A;  INTRATHECAL PUMP REVISION  . TOE SURGERY     then revision 8/18  . TOTAL KNEE ARTHROPLASTY Right 05/24/2020   Procedure: RIGHT TOTAL KNEE ARTHROPLASTY;  Surgeon: Melrose Nakayama, MD;  [facility];  Service: Orthopedics;  Laterality: Right;    MEDICATIONS: . ACCU-CHEK AVIVA PLUS test strip  . Accu-Chek Softclix Lancets lancets  . AMBULATORY NON FORMULARY MEDICATION  . Ascorbic Acid (VITAMIN C) 1000 MG tablet  . aspirin 81 MG tablet  . b complex vitamins tablet  . Biotin 10000 MCG TABS  . buPROPion (WELLBUTRIN XL) 150 MG 24 hr tablet  . CALCIUM-MAGNESIUM-ZINC PO  . Cholecalciferol (VITAMIN D) 50 MCG (2000 UT) tablet  . CINNAMON PO  . fluticasone (FLONASE) 50 MCG/ACT nasal spray  . glucosamine-chondroitin 500-400 MG tablet  . Magnesium 400 MG CAPS  . naloxone (NARCAN) 2 MG/2ML injection  . nitroGLYCERIN (NITROSTAT) 0.4 MG SL tablet  . NONFORMULARY OR COMPOUNDED ITEM  . Omega-3 Fatty Acids (FISH OIL) 1200 MG CAPS  . OVER THE COUNTER MEDICATION  . oxyCODONE-acetaminophen (PERCOCET) 5-325 MG tablet  . [START ON 09/25/2020] oxyCODONE-acetaminophen (PERCOCET) 5-325 MG tablet  . [START ON 10/25/2020] oxyCODONE-acetaminophen (PERCOCET) 5-325 MG tablet  . PARoxetine (PAXIL) 20 MG tablet  . polyethylene glycol (MIRALAX / GLYCOLAX) packet  . pregabalin (LYRICA) 150 MG capsule  . rosuvastatin (CRESTOR) 20 MG tablet  . tiZANidine (ZANAFLEX) 4 MG tablet  . vitamin E 180 MG (400 UNITS) capsule   No current  facility-administered medications for this encounter.     Konrad Felix, PA-C [facility] [phone]

## 2020-08-29 NOTE — Anesthesia Preprocedure Evaluation (Deleted)
Anesthesia Evaluation    Airway        Dental   Pulmonary former smoker,           Cardiovascular      Neuro/Psych    GI/Hepatic   Endo/Other  diabetes  Renal/GU      Musculoskeletal   Abdominal   Peds  Hematology   Anesthesia Other Findings   Reproductive/Obstetrics                                                              Anesthesia Evaluation  Patient identified by MRN, date of birth, ID band Patient awake    Reviewed: Allergy & Precautions, NPO status , Patient's Chart, lab work & pertinent test results  Airway Mallampati: II  TM Distance: >3 FB Neck ROM: Full    Dental no notable dental hx. (+) Teeth Intact, Dental Advisory Given   Pulmonary sleep apnea and Continuous Positive Airway Pressure Ventilation , former smoker,    Pulmonary exam normal breath sounds clear to auscultation       Cardiovascular Exercise Tolerance: Good hypertension, + CAD  Normal cardiovascular exam+ dysrhythmias  Rhythm:Regular Rate:Normal  1021/21 Echo  1. Left ventricular ejection fraction, by visual estimation, is 60 to  65%. The left ventricle has normal function. Normal left ventricular size.  There is no left ventricular hypertrophy. Normal wall motion. Normal  diastolic function.    Neuro/Psych Anxiety Depression  Neuromuscular disease    GI/Hepatic Neg liver ROS,   Endo/Other  diabetes, Well Controlled, Type 2  Renal/GU Renal diseaseK+ 4.6 Cr 0.97     Musculoskeletal  (+) Arthritis , Fibromyalgia -  Abdominal (+) + obese,   Peds  Hematology Hgb 12.6 plt 189   Anesthesia Other Findings   Reproductive/Obstetrics                           Anesthesia Physical Anesthesia Plan  ASA: III  Anesthesia Plan: Spinal   Post-op Pain Management:  Regional for Post-op pain   Induction:   PONV Risk Score and Plan: 3 and Treatment may vary  due to age or medical condition, Ondansetron and Midazolam  Airway Management Planned: Nasal Cannula and Natural Airway  Additional Equipment: None  Intra-op Plan:   Post-operative Plan:   Informed Consent: I have reviewed the patients History and Physical, chart, labs and discussed the procedure including the risks, benefits and alternatives for the proposed anesthesia with the patient or authorized representative who has indicated his/her understanding and acceptance.     Dental advisory given  Plan Discussed with: CRNA and Anesthesiologist  Anesthesia Plan Comments: (See PAT note 05/18/2020, Konrad Felix, PA-C  Spinal w R adductor canal)      Anesthesia Quick Evaluation  Anesthesia Physical Anesthesia Plan  ASA:   Anesthesia Plan:    Post-op Pain Management:    Induction:   PONV Risk Score and Plan:   Airway Management Planned:   Additional Equipment:   Intra-op Plan:   Post-operative Plan:   Informed Consent:   Plan Discussed with:   Anesthesia Plan Comments: (See PAT note 08/26/2020, Konrad Felix, PA-C)        Anesthesia Quick Evaluation

## 2020-09-02 ENCOUNTER — Other Ambulatory Visit (HOSPITAL_COMMUNITY)
Admission: RE | Admit: 2020-09-02 | Discharge: 2020-09-02 | Disposition: A | Discharge status: Home / Self Care | Payer: Medicare Other | Source: Ambulatory Visit | Attending: Orthopaedic Surgery | Admitting: Orthopaedic Surgery

## 2020-09-02 DIAGNOSIS — Z20822 Contact with and (suspected) exposure to covid-19: Secondary | ICD-10-CM | POA: Insufficient documentation

## 2020-09-02 DIAGNOSIS — Z01812 Encounter for preprocedural laboratory examination: Secondary | ICD-10-CM | POA: Diagnosis present

## 2020-09-02 LAB — SARS CORONAVIRUS 2 (TAT 6-24 HRS): SARS Coronavirus 2: NEGATIVE

## 2020-09-02 NOTE — Telephone Encounter (Signed)
Left detailed msg letting her know pt is still cleared for surgery

## 2020-09-05 MED ORDER — BUPIVACAINE LIPOSOME 1.3 % IJ SUSP
20.0000 mL | Freq: Once | INTRAMUSCULAR | Status: DC
  Filled 2020-09-05: qty 20

## 2020-09-05 MED ORDER — TRANEXAMIC ACID 1000 MG/10ML IV SOLN
2000.0000 mg | INTRAVENOUS | Status: DC
  Filled 2020-09-05: qty 20

## 2020-09-05 NOTE — H&P (Signed)
TOTAL KNEE REVISION ADMISSION H&P  Patient is being admitted for right revision total knee arthroplasty.  Subjective:  Chief Complaint:right knee pain.  HPI: Melinda [name], 65 y.o. female, has a history of pain and functional disability in the right knee(s) due to failed previous arthroplasty and patient has failed non-surgical conservative treatments for greater than 12 weeks to include NSAID's and/or analgesics, flexibility and strengthening excercises, supervised PT with diminished ADL's post treatment, use of assistive devices, weight reduction as appropriate and activity modification. The indications for the revision of the total knee arthroplasty are Instability with ambulation. Onset of symptoms was gradual starting 1 month ago years ago with gradually worsening course since that time.  Prior procedures on the right knee(s) include arthroplasty.  Patient currently rates pain in the right knee(s) at 10 out of 10 with activity. There is worsening of pain with activity and weight bearing, pain that interferes with activities of daily living, joint swelling and instability wiyth walking.  Patient has evidence of a well aligned TKR prosthesis by imaging studies. This condition presents safety issues increasing the risk of falls.There is no current active infection.  Patient Active Problem List   Diagnosis Date Noted  . Calf swelling 08/25/2020  . Abdominal wall seroma 08/23/2020  . Primary osteoarthritis of right knee 05/24/2020  . Pre-op evaluation 05/23/2020  . History of methicillin resistant staphylococcus aureus (MRSA) 05/23/2020  . Amputation of toe of right foot ([place]) 04/29/2020  . Pain and numbness of upper extremity (Bilateral) 04/12/2020  . Cervicalgia 04/12/2020  . History of carpal tunnel surgery (Bilateral) 04/12/2020  . Malposition of intrathecal infusion catheter 03/11/2020  . Malfunction of intrathecal infusion pump 03/09/2020  . Thoracic facet hypertrophy/arthropathy  (Multilevel) (Bilateral) 12/09/2019  . Thoracic facet syndrome (Multilevel) (Bilateral) 12/09/2019  . Encounter for interrogation of infusion pump 12/09/2019  . Sensation disturbance of skin 07/08/2019  . Allodynia 07/08/2019  . Hyperalgesia 07/08/2019  . Chronic low back pain (Bilateral) w/o sciatica 06/23/2019  . DDD (degenerative disc disease), lumbosacral 06/23/2019  . Diabetes 1.5, managed as type 2 ([place]) 05/12/2019  . Spondylosis without myelopathy or radiculopathy, lumbosacral region 11/05/2018  . CKD (chronic kidney disease) stage 3, GFR 30-59 ml/min 09/04/2018  . 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 08/06/2018  . DDD (degenerative disc disease), lumbar 05/15/2018  . Abnormal MRI, lumbar spine (11/18/2019) 05/06/2018  . Lumbar facet hypertrophy (Multilevel) 05/06/2018  . Lumbar foraminal stenosis 05/06/2018  . Lumbar lateral recess stenosis 05/06/2018  . Lumbar spinal stenosis w/ neurogenic claudication 04/09/2018  . Degenerative arthritis of knee (Bilateral) 03/12/2018  . Lumbar spondylosis 01/23/2018  . Hip pain, acute, left 01/01/2018  . Other secondary hypertension 08/09/2017  . OSA (obstructive sleep apnea) 08/09/2017  . Osteoarthritis of knee (Bilateral) (R>L) 06/08/2016  . Long term current use of opiate analgesic 04/21/2016  . Long term prescription opiate use 04/21/2016  . Opiate use 04/21/2016  . Encounter for adjustment or management of infusion pump 04/21/2016  . Osteoarthritis, multiple sites 04/21/2016  . Chronic musculoskeletal pain 04/21/2016  . Mixed anxiety and depressive disorder 09/11/2015  . Opioid-induced constipation (OIC) 07/27/2015  . Chronic knee pain (Primary Area of Pain) (Right) 07/27/2015  . Osteoarthritis of knee (Left) 07/27/2015  . 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 Chronic neck pain 05/24/2015  . Chronic low back pain (Bilateral) (L>R) w/  sciatica (Right) 05/24/2015  . Epidural fibrosis 05/24/2015  . Cervical spondylosis  05/24/2015  . Neuropathic pain 05/24/2015  . Lumbar facet syndrome 05/24/2015  . Fibromyalgia 05/24/2015  . Presence of functional implant (Medtronic programmable intrathecal pump) (Right abdominal area) 05/24/2015  . Presence of intrathecal pump (Medtronic intrathecal programmable pump) (40 mL pump) 05/24/2015  . Type 2 diabetes mellitus with diabetic mononeuropathy, without long-term current use of insulin ([place]) 04/27/2014  . Pure hypercholesterolemia 03/02/2014  . CAD (coronary artery disease) 10/04/2013  . Atherosclerotic heart disease of native coronary artery without angina pectoris 10/04/2013  . Chronic pain syndrome 04/18/2010   Past Medical History:  Diagnosis Date  . Amputation of great toe, right, traumatic ([place]) 05/30/2010  . Amputation of second toe, right, traumatic ([employer]) 09/2017  . Anginal pain ([place])   . Anxiety   . Blue toes    2nd toe on right foot, will get appt.  . Bulging disc   . CAD (coronary artery disease) 2009   s/p stent to LAD  . Cardiac arrhythmia due to congenital heart disease 1992   WPW.  Ablations done.  Now has rare episodes  . Chicken pox   . Chronic fatigue   . Chronic kidney disease    "problem with kidney filtration"  . Coronary arteritis   . Degenerative disc disease    Back, neck, hands, knees  . Depression   . Diabetes mellitus without complication ([place])   . Facet joint disease   . Fibromyalgia   . Heart disease   . Hyperlipidemia   . IBS (irritable bowel syndrome)   . MCL deficiency, knee   . MRSA (methicillin resistant staph aureus) culture positive 2011   GREAT TOE RIGHT FOOT  . Neuropathy 04/18/2010  . Orthostatic hypotension 01/2020  . Peripheral neuropathy   . Renal insufficiency   . Restless leg syndrome   . Sepsis ([place]) 09/2013  . Sleep apnea 08/17/2003   uses CPAP, sleep study at [facility] (mild to moderate)  . Spinal stenosis      Past Surgical History:  Procedure Laterality Date  . ABLATION     UTERUS  . ABLATION     HEART  . AMPUTATION TOE Right 02/01/2017   Procedure: AMPUTATION TOE-RIGHT 2ND MPJ;  Surgeon: Albertine Patricia, DPM;  Location: [facility];  Service: Podiatry;  Laterality: Right;  . ANTERIOR CERVICAL DECOMP/DISCECTOMY FUSION N/A 07/28/2014   Procedure: ANTERIOR CERVICAL DECOMPRESSION/DISCECTOMY FUSION CERVICAL 3-4,4-5,5-6 LEVELS WITH INSTRUMENTATION AND ALLOGRAFT;  Surgeon: Sinclair Ship, MD;  Location: [place];  Service: Orthopedics;  Laterality: N/A;  Anterior cervical decompression fusion, cervical 3-4, cervical 4-5, cervical 5-6 with instrumentation and allograft  . BACK SURGERY     X 3 1979, 1994, 1995  . CARPAL TUNNEL RELEASE Right   . CHOLECYSTECTOMY  2003  . CORONARY ANGIOPLASTY  2008  . EYE SURGERY Bilateral 2013   Eyelid lift   . FOOT SURGERY Right    BIG TOE  . FOOT SURGERY Bilateral    PLANTAR FASCIITIS  . FOOT SURGERY Right    2ND TOE  . GALLBLADDER SURGERY    . HAMMER TOE SURGERY Right 10/17/2017   Procedure: HAMMER TOE CORRECTION-4TH TOE;  Surgeon: Albertine Patricia, DPM;  Location: [place];  Service: Podiatry;  Laterality: Right;  LMA- WITH LOCAL Diabetic - diet controlled  . HAND SURGERY Left   . HAND SURGEY Left   . HEART STENT  2009   LAD  . INFUSION PUMP IMPLANTATION     X2 with morphine and baclofen  . INTRATHECAL PUMP REVISION N/A  04/25/2018   Procedure: Intrathecal pump replacement;  Surgeon: Clydell Hakim, MD;  Location: [place];  Service: Neurosurgery;  Laterality: N/A;  right  . INTRATHECAL PUMP REVISION Right 04/25/2018  . INTRATHECAL PUMP REVISION N/A 12/12/2018   Procedure: Intrathecal pump revision with exploration of pocket;  Surgeon: Clydell Hakim, MD;  Location: [place];  Service: Neurosurgery;  Laterality: N/A;  Intrathecal pump revision with exploration of pocket  . INTRATHECAL PUMP REVISION N/A 03/11/2020   Procedure: INTRATHECAL PUMP  REVISION;  Surgeon: [name], MD;  Location: [facility];  Service: Neurosurgery;  Laterality: N/A;  . IRRIGATION AND DEBRIDEMENT FOOT Right 02/23/2017   Procedure: IRRIGATION AND DEBRIDEMENT FOOT-right foot;  Surgeon: Samara Deist, DPM;  Location: [facility];  Service: Podiatry;  Laterality: Right;  . KNEE SURGERY Right 05/24/2020  . PAIN PUMP REVISION N/A 08/15/2018   Procedure: Intrathecal PUMP REVISION;  Surgeon: Clydell Hakim, MD;  Location: [place];  Service: Neurosurgery;  Laterality: N/A;  INTRATHECAL PUMP REVISION  . TOE SURGERY     then revision 8/18  . TOTAL KNEE ARTHROPLASTY Right 05/24/2020   Procedure: RIGHT TOTAL KNEE ARTHROPLASTY;  Surgeon: Melrose Nakayama, MD;  Location: [facility];  Service: Orthopedics;  Laterality: Right;    Current Facility-Administered Medications  Medication Dose Route Frequency Provider Last Rate Last Admin  . bupivacaine liposome (EXPAREL) 1.3 % injection 266 mg  20 mL Other Once Melrose Nakayama, MD      . Derrill Memo ON 09/06/2020] tranexamic acid (CYKLOKAPRON) 2,000 mg in sodium chloride 0.9 % 50 mL Topical Application  6,734 mg Topical To OR Melrose Nakayama, MD       Current Outpatient Medications  Medication Sig Dispense Refill Last Dose  . Ascorbic Acid (VITAMIN C) 1000 MG tablet Take 1,000 mg by mouth daily.     Marland Kitchen aspirin 81 MG tablet Take 1 tablet (81 mg total) by mouth 2 (two) times daily after a meal. (Patient taking differently: Take 81 mg by mouth daily.) 30 tablet 0   . b complex vitamins tablet Take 1 tablet by mouth in the morning.     . Biotin 10000 MCG TABS Take 10,000 mcg by mouth daily.     Marland Kitchen buPROPion (WELLBUTRIN XL) 150 MG 24 hr tablet TAKE 1 TABLET BY MOUTH EVERY DAY (Patient taking differently: Take 150 mg by mouth daily.) 90 tablet 2   . CALCIUM-MAGNESIUM-ZINC PO Take 3 capsules by mouth daily.     . Cholecalciferol (VITAMIN D) 50 MCG (2000 UT) tablet Take 2,000 Units by mouth daily.     Marland Kitchen CINNAMON PO Take 1,000 mg by mouth daily.      . fluticasone (FLONASE) 50 MCG/ACT nasal spray Place 2 sprays into both nostrils daily as needed for allergies.      Marland Kitchen glucosamine-chondroitin 500-400 MG tablet Take 2 tablets by mouth daily.      . Magnesium 400 MG CAPS Take 400 mg by mouth at bedtime.     . naloxone (NARCAN) 2 MG/2ML injection Inject 1 mL (1 mg total) into the muscle as needed for up to 2 doses (for opioid overdose). In case of emergency (overdose), inject into muscle of upper arm or leg and call 911. 2 mL 0   . nitroGLYCERIN (NITROSTAT) 0.4 MG SL tablet Place 1 tablet (0.4 mg total) under the tongue every 5 (five) minutes as needed for chest pain. Please keep upcoming appt in October. Thank you 25 tablet 1   . NONFORMULARY OR COMPOUNDED ITEM 212.8 mcg by Epidural  Infusion route. Medtronic Neuromodulation pump  Fentanyl 1,000.0 mcg/ml Baclofen 240.0 mcg/ml Bupivacaine 20.0 mg/ml Total daily dose 269.6 mcg/day     . Omega-3 Fatty Acids (FISH OIL) 1200 MG CAPS Take 1,200 mg by mouth in the morning.      Marland Kitchen OVER THE COUNTER MEDICATION Take 1 capsule by mouth daily. [place] Plus     . oxyCODONE-acetaminophen (PERCOCET) 5-325 MG tablet Take 1 tablet by mouth every 8 (eight) hours as needed for severe pain. 90 tablet 0   . PARoxetine (PAXIL) 20 MG tablet TAKE 0.5 TABLET BY MOUTH DAILY IN THE MORNING (Patient taking differently: Take 10 mg by mouth daily. TAKE 0.5 TABLET BY MOUTH DAILY IN THE MORNING) 45 tablet 1   . polyethylene glycol (MIRALAX / GLYCOLAX) packet Take 17 g by mouth daily.     . pregabalin (LYRICA) 150 MG capsule Take 1 capsule (150 mg total) by mouth 3 (three) times daily. 90 capsule 2   . rosuvastatin (CRESTOR) 20 MG tablet TAKE 1 TABLET BY MOUTH EVERY DAY (Patient taking differently: Take 20 mg by mouth daily.) 90 tablet 3   . tiZANidine (ZANAFLEX) 4 MG tablet Take 1 tablet (4 mg total) by mouth every 8 (eight) hours as needed for muscle spasms. 90 tablet 2   . vitamin E 180 MG (400 UNITS) capsule Take 400 Units  by mouth daily.     Marland Kitchen ACCU-CHEK AVIVA PLUS test strip USE TO CHECK BLOOD SUGAR ONE TIME A DAY 100 strip 5   . Accu-Chek Softclix Lancets lancets USE TO CHECK BLOOD SUGAR 1 TIME DAILY 100 each 2   . AMBULATORY NON FORMULARY MEDICATION Medication Name: CPAP MASK OF CHOICE FOR HOME DEVICE 1 each 0   . [START ON 09/25/2020] oxyCODONE-acetaminophen (PERCOCET) 5-325 MG tablet Take 1 tablet by mouth every 8 (eight) hours as needed for severe pain. 90 tablet 0   . [START ON 10/25/2020] oxyCODONE-acetaminophen (PERCOCET) 5-325 MG tablet Take 1 tablet by mouth every 8 (eight) hours as needed for severe pain. 90 tablet 0    No Known Allergies  Social History   Tobacco Use  . Smoking status: Former Smoker    Packs/day: 1.50    Years: 32.00    Pack years: 48.00    Types: Cigarettes    Quit date: 07/22/2007    Years since quitting: 13.1  . Smokeless tobacco: Never Used  Substance Use Topics  . Alcohol use: Not Currently    Alcohol/week: 0.0 standard drinks    Comment: occ - Holidays    Family History  Problem Relation Age of Onset  . Lung cancer Mother   . Diabetes Father   . Heart disease Maternal Grandfather   . Diabetes Paternal Grandmother   . Colon cancer Paternal Grandfather   . Stroke Neg Hx       Review of Systems  Musculoskeletal: Positive for arthralgias.       Right knee  All other systems reviewed and are negative.    Objective:  Physical Exam Constitutional:      Appearance: Normal appearance.  HENT:     Head: Normocephalic and atraumatic.     Nose: Nose normal.     Mouth/Throat:     Pharynx: Oropharynx is clear.  Eyes:     Extraocular Movements: Extraocular movements intact.  Cardiovascular:     Rate and Rhythm: Normal rate and regular rhythm.  Pulmonary:     Effort: Pulmonary effort is normal.  Abdominal:     Palpations:  Abdomen is soft.  Musculoskeletal:     Cervical back: Normal range of motion.     Comments: Exam shows motion from 0-120.  She does have  laxity in both extension and flexion.  I do not appreciate an effusion.  She still has some calf pain and a fullness through her calf but the Homan test is negative.    Skin:    General: Skin is warm and dry.  Neurological:     General: No focal deficit present.     Mental Status: She is alert and oriented to person, place, and time.  Psychiatric:        Mood and Affect: Mood normal.        Behavior: Behavior normal.        Thought Content: Thought content normal.        Judgment: Judgment normal.     Vital signs in last 24 hours:    Labs:  Estimated body mass index is 33.74 kg/m as calculated from the following:   Height as of 08/25/20: 5\' 9"  (1.753 m).   Weight as of 08/25/20: 103.6 kg.  Imaging Review Plain radiographs demonstrate no degenerative joint disease of the right knee(s). The overall alignment is neutral.The bone quality appears to be good for age and reported activity level.  Assessment/Plan:  End stage arthritis, right knee(s) with failed previous arthroplasty.   The patient history, physical examination, clinical judgment of the provider and imaging studies are consistent with end stage degenerative joint disease of the right knee(s), previous total knee arthroplasty. Revision total knee arthroplasty is deemed medically necessary. The treatment options including medical management, injection therapy, arthroscopy and revision arthroplasty were discussed at length. The risks and benefits of revision total knee arthroplasty were presented and reviewed. The risks due to aseptic loosening, infection, stiffness, patella tracking problems, thromboembolic complications and other imponderables were discussed. The patient acknowledged the explanation, agreed to proceed with the plan and consent was signed. Patient is being admitted for inpatient treatment for surgery, pain control, PT, OT, prophylactic antibiotics, VTE prophylaxis, progressive ambulation and ADL's and discharge  planning.The patient is planning to be discharged home with home health services

## 2020-09-06 ENCOUNTER — Inpatient Hospital Stay (HOSPITAL_COMMUNITY): Payer: Medicare Other | Admitting: Physician Assistant

## 2020-09-06 ENCOUNTER — Encounter (HOSPITAL_COMMUNITY)
Admission: RE | Disposition: A | Discharge status: Home Health | Payer: Self-pay | Source: Ambulatory Visit | Attending: Orthopaedic Surgery

## 2020-09-06 ENCOUNTER — Inpatient Hospital Stay (HOSPITAL_COMMUNITY): Payer: Medicare Other

## 2020-09-06 ENCOUNTER — Encounter (HOSPITAL_COMMUNITY): Payer: Self-pay | Admitting: Orthopaedic Surgery

## 2020-09-06 ENCOUNTER — Inpatient Hospital Stay (HOSPITAL_COMMUNITY)
Admission: RE | Admit: 2020-09-06 | Discharge: 2020-09-06 | DRG: 489 | Disposition: A | Discharge status: Home Health | Payer: Medicare Other | Source: Ambulatory Visit | Attending: Orthopaedic Surgery | Admitting: Orthopaedic Surgery

## 2020-09-06 DIAGNOSIS — K589 Irritable bowel syndrome without diarrhea: Secondary | ICD-10-CM | POA: Diagnosis present

## 2020-09-06 DIAGNOSIS — G894 Chronic pain syndrome: Secondary | ICD-10-CM | POA: Diagnosis present

## 2020-09-06 DIAGNOSIS — Z8249 Family history of ischemic heart disease and other diseases of the circulatory system: Secondary | ICD-10-CM

## 2020-09-06 DIAGNOSIS — Z800 Family history of malignant neoplasm of digestive organs: Secondary | ICD-10-CM

## 2020-09-06 DIAGNOSIS — M25361 Other instability, right knee: Secondary | ICD-10-CM | POA: Diagnosis present

## 2020-09-06 DIAGNOSIS — Z87891 Personal history of nicotine dependence: Secondary | ICD-10-CM | POA: Diagnosis not present

## 2020-09-06 DIAGNOSIS — E1342 Other specified diabetes mellitus with diabetic polyneuropathy: Secondary | ICD-10-CM | POA: Diagnosis present

## 2020-09-06 DIAGNOSIS — Z7982 Long term (current) use of aspirin: Secondary | ICD-10-CM

## 2020-09-06 DIAGNOSIS — G2581 Restless legs syndrome: Secondary | ICD-10-CM | POA: Diagnosis present

## 2020-09-06 DIAGNOSIS — Z955 Presence of coronary angioplasty implant and graft: Secondary | ICD-10-CM | POA: Diagnosis not present

## 2020-09-06 DIAGNOSIS — I129 Hypertensive chronic kidney disease with stage 1 through stage 4 chronic kidney disease, or unspecified chronic kidney disease: Secondary | ICD-10-CM | POA: Diagnosis present

## 2020-09-06 DIAGNOSIS — I2510 Atherosclerotic heart disease of native coronary artery without angina pectoris: Secondary | ICD-10-CM | POA: Diagnosis present

## 2020-09-06 DIAGNOSIS — F418 Other specified anxiety disorders: Secondary | ICD-10-CM | POA: Diagnosis present

## 2020-09-06 DIAGNOSIS — Z79899 Other long term (current) drug therapy: Secondary | ICD-10-CM | POA: Diagnosis not present

## 2020-09-06 DIAGNOSIS — I456 Pre-excitation syndrome: Secondary | ICD-10-CM | POA: Diagnosis present

## 2020-09-06 DIAGNOSIS — E1322 Other specified diabetes mellitus with diabetic chronic kidney disease: Secondary | ICD-10-CM | POA: Diagnosis present

## 2020-09-06 DIAGNOSIS — N183 Chronic kidney disease, stage 3 unspecified: Secondary | ICD-10-CM | POA: Diagnosis present

## 2020-09-06 DIAGNOSIS — Z01818 Encounter for other preprocedural examination: Secondary | ICD-10-CM

## 2020-09-06 DIAGNOSIS — Z833 Family history of diabetes mellitus: Secondary | ICD-10-CM | POA: Diagnosis not present

## 2020-09-06 DIAGNOSIS — Z89411 Acquired absence of right great toe: Secondary | ICD-10-CM | POA: Diagnosis not present

## 2020-09-06 DIAGNOSIS — Z8614 Personal history of Methicillin resistant Staphylococcus aureus infection: Secondary | ICD-10-CM | POA: Diagnosis not present

## 2020-09-06 DIAGNOSIS — M2351 Chronic instability of knee, right knee: Secondary | ICD-10-CM | POA: Diagnosis present

## 2020-09-06 DIAGNOSIS — Z89421 Acquired absence of other right toe(s): Secondary | ICD-10-CM | POA: Diagnosis not present

## 2020-09-06 DIAGNOSIS — M797 Fibromyalgia: Secondary | ICD-10-CM | POA: Diagnosis present

## 2020-09-06 DIAGNOSIS — E785 Hyperlipidemia, unspecified: Secondary | ICD-10-CM | POA: Diagnosis present

## 2020-09-06 DIAGNOSIS — G4733 Obstructive sleep apnea (adult) (pediatric): Secondary | ICD-10-CM | POA: Diagnosis present

## 2020-09-06 DIAGNOSIS — M25561 Pain in right knee: Secondary | ICD-10-CM | POA: Diagnosis present

## 2020-09-06 DIAGNOSIS — Z801 Family history of malignant neoplasm of trachea, bronchus and lung: Secondary | ICD-10-CM | POA: Diagnosis not present

## 2020-09-06 HISTORY — PX: TOTAL KNEE REVISION: SHX996

## 2020-09-06 LAB — TYPE AND SCREEN
ABO/RH(D): O POS
Antibody Screen: NEGATIVE

## 2020-09-06 LAB — BASIC METABOLIC PANEL
Anion gap: 7 (ref 5–15)
BUN: 38 mg/dL — ABNORMAL HIGH (ref 8–23)
CO2: 25 mmol/L (ref 22–32)
Calcium: 9.2 mg/dL (ref 8.9–10.3)
Chloride: 107 mmol/L (ref 98–111)
Creatinine, Ser: 1.04 mg/dL — ABNORMAL HIGH (ref 0.44–1.00)
GFR, Estimated: >60 mL/min (ref >60)
Glucose, Bld: 113 mg/dL — ABNORMAL HIGH (ref 70–99)
Potassium: 4.5 mmol/L (ref 3.5–5.1)
Sodium: 139 mmol/L (ref 135–145)

## 2020-09-06 LAB — CBC WITH DIFFERENTIAL/PLATELET
Abs Immature Granulocytes: 0.01 10*3/uL (ref 0.00–0.07)
Basophils Absolute: 0.1 10*3/uL (ref 0.0–0.1)
Basophils Relative: 1 %
Eosinophils Absolute: 0.2 10*3/uL (ref 0.0–0.5)
Eosinophils Relative: 3 %
HCT: 38.4 % (ref 36.0–46.0)
Hemoglobin: 12.4 g/dL (ref 12.0–15.0)
Immature Granulocytes: 0 %
Lymphocytes Relative: 22 %
Lymphs Abs: 1.4 10*3/uL (ref 0.7–4.0)
MCH: 28.1 pg (ref 26.0–34.0)
MCHC: 32.3 g/dL (ref 30.0–36.0)
MCV: 86.9 fL (ref 80.0–100.0)
Monocytes Absolute: 0.5 10*3/uL (ref 0.1–1.0)
Monocytes Relative: 8 %
Neutro Abs: 4.1 10*3/uL (ref 1.7–7.7)
Neutrophils Relative %: 66 %
Platelets: 199 10*3/uL (ref 150–400)
RBC: 4.42 MIL/uL (ref 3.87–5.11)
RDW: 13.8 % (ref 11.5–15.5)
WBC: 6.3 10*3/uL (ref 4.0–10.5)
nRBC: 0 % (ref 0.0–0.2)

## 2020-09-06 LAB — URINALYSIS, ROUTINE W REFLEX MICROSCOPIC
Bilirubin Urine: NEGATIVE
Glucose, UA: NEGATIVE mg/dL
Hgb urine dipstick: NEGATIVE
Ketones, ur: NEGATIVE mg/dL
Leukocytes,Ua: NEGATIVE
Nitrite: NEGATIVE
Protein, ur: NEGATIVE mg/dL
Specific Gravity, Urine: 1.021 (ref 1.005–1.030)
pH: 5 (ref 5.0–8.0)

## 2020-09-06 LAB — APTT: aPTT: 33 seconds (ref 24–36)

## 2020-09-06 LAB — PROTIME-INR
INR: 1.1 (ref 0.8–1.2)
Prothrombin Time: 13.7 seconds (ref 11.4–15.2)

## 2020-09-06 LAB — GLUCOSE, CAPILLARY
Glucose-Capillary: 111 mg/dL — ABNORMAL HIGH (ref 70–99)
Glucose-Capillary: 110 mg/dL — ABNORMAL HIGH (ref 70–99)

## 2020-09-06 SURGERY — TOTAL KNEE REVISION
Anesthesia: Monitor Anesthesia Care | Site: Knee | Laterality: Right

## 2020-09-06 MED ORDER — PROPOFOL 500 MG/50ML IV EMUL
INTRAVENOUS | Status: DC | PRN
  Administered 2020-09-06: 25 ug/kg/min via INTRAVENOUS

## 2020-09-06 MED ORDER — MIDAZOLAM HCL 5 MG/5ML IJ SOLN
INTRAMUSCULAR | Status: DC | PRN
  Administered 2020-09-06: 2 mg via INTRAVENOUS

## 2020-09-06 MED ORDER — MEPERIDINE HCL 50 MG/ML IJ SOLN
6.2500 mg | INTRAMUSCULAR | Status: DC | PRN
Start: 2020-09-06 — End: 2020-09-06

## 2020-09-06 MED ORDER — OXYCODONE HCL 5 MG PO TABS: 5.0000 mg | ORAL_TABLET | Freq: Once | ORAL | Status: DC | PRN

## 2020-09-06 MED ORDER — LIDOCAINE 2% (20 MG/ML) 5 ML SYRINGE
INTRAMUSCULAR | Status: AC
  Filled 2020-09-06: qty 5

## 2020-09-06 MED ORDER — PROPOFOL 500 MG/50ML IV EMUL
INTRAVENOUS | Status: AC
  Filled 2020-09-06: qty 50

## 2020-09-06 MED ORDER — FENTANYL CITRATE (PF) 100 MCG/2ML IJ SOLN
25.0000 ug | INTRAMUSCULAR | Status: DC | PRN
Start: 2020-09-06 — End: 2020-09-06

## 2020-09-06 MED ORDER — TRANEXAMIC ACID-NACL 1000-0.7 MG/100ML-% IV SOLN
1000.0000 mg | INTRAVENOUS | Status: AC
  Administered 2020-09-06: 1000 mg via INTRAVENOUS
  Filled 2020-09-06: qty 100

## 2020-09-06 MED ORDER — PROPOFOL 10 MG/ML IV BOLUS
INTRAVENOUS | Status: DC | PRN
  Administered 2020-09-06: 40 mg via INTRAVENOUS

## 2020-09-06 MED ORDER — LACTATED RINGERS IV SOLN: INTRAVENOUS | Status: DC | PRN

## 2020-09-06 MED ORDER — BUPIVACAINE LIPOSOME 1.3 % IJ SUSP
INTRAMUSCULAR | Status: DC | PRN
  Administered 2020-09-06: 20 mL

## 2020-09-06 MED ORDER — DEXMEDETOMIDINE (PRECEDEX) IN NS 20 MCG/5ML (4 MCG/ML) IV SYRINGE
PREFILLED_SYRINGE | INTRAVENOUS | Status: AC
  Filled 2020-09-06: qty 5

## 2020-09-06 MED ORDER — TRANEXAMIC ACID 1000 MG/10ML IV SOLN
INTRAVENOUS | Status: DC | PRN
  Administered 2020-09-06: 2000 mg via TOPICAL

## 2020-09-06 MED ORDER — CHLORHEXIDINE GLUCONATE 0.12 % MT SOLN
15.0000 mL | Freq: Once | OROMUCOSAL | Status: AC
  Administered 2020-09-06: 15 mL via OROMUCOSAL

## 2020-09-06 MED ORDER — ROPIVACAINE HCL 7.5 MG/ML IJ SOLN
INTRAMUSCULAR | Status: DC | PRN
  Administered 2020-09-06: 30 mL via PERINEURAL

## 2020-09-06 MED ORDER — MIDAZOLAM HCL 2 MG/2ML IJ SOLN
INTRAMUSCULAR | Status: AC
  Filled 2020-09-06: qty 2

## 2020-09-06 MED ORDER — LACTATED RINGERS IV SOLN: INTRAVENOUS | Status: DC

## 2020-09-06 MED ORDER — BUPIVACAINE-EPINEPHRINE 0.25% -1:200000 IJ SOLN
INTRAMUSCULAR | Status: DC | PRN
  Administered 2020-09-06: 30 mL

## 2020-09-06 MED ORDER — BUPIVACAINE IN DEXTROSE 0.75-8.25 % IT SOLN
INTRATHECAL | Status: DC | PRN
  Administered 2020-09-06: 1.8 mL via INTRATHECAL

## 2020-09-06 MED ORDER — BUPIVACAINE-EPINEPHRINE (PF) 0.25% -1:200000 IJ SOLN
INTRAMUSCULAR | Status: AC
  Filled 2020-09-06: qty 30

## 2020-09-06 MED ORDER — CEFAZOLIN SODIUM-DEXTROSE 2-4 GM/100ML-% IV SOLN
2.0000 g | INTRAVENOUS | Status: AC
  Administered 2020-09-06: 2 g via INTRAVENOUS
  Filled 2020-09-06: qty 100

## 2020-09-06 MED ORDER — FENTANYL CITRATE (PF) 100 MCG/2ML IJ SOLN
INTRAMUSCULAR | Status: DC | PRN
  Administered 2020-09-06 (×2): 50 ug via INTRAVENOUS

## 2020-09-06 MED ORDER — PROPOFOL 10 MG/ML IV BOLUS
INTRAVENOUS | Status: AC
  Filled 2020-09-06: qty 20

## 2020-09-06 MED ORDER — ACETAMINOPHEN 325 MG PO TABS: 325.0000 mg | ORAL_TABLET | ORAL | Status: DC | PRN

## 2020-09-06 MED ORDER — ONDANSETRON HCL 4 MG/2ML IJ SOLN
INTRAMUSCULAR | Status: DC | PRN
  Administered 2020-09-06: 4 mg via INTRAVENOUS

## 2020-09-06 MED ORDER — DEXMEDETOMIDINE (PRECEDEX) IN NS 20 MCG/5ML (4 MCG/ML) IV SYRINGE
PREFILLED_SYRINGE | INTRAVENOUS | Status: DC | PRN
  Administered 2020-09-06 (×4): 4 ug via INTRAVENOUS

## 2020-09-06 MED ORDER — LIDOCAINE 2% (20 MG/ML) 5 ML SYRINGE
INTRAMUSCULAR | Status: DC | PRN
  Administered 2020-09-06: 40 mg via INTRAVENOUS

## 2020-09-06 MED ORDER — OXYCODONE-ACETAMINOPHEN 5-325 MG PO TABS: 1.0000 | ORAL_TABLET | Freq: Four times a day (QID) | ORAL | 0 refills | Status: DC | PRN

## 2020-09-06 MED ORDER — ACETAMINOPHEN 160 MG/5ML PO SOLN: 325.0000 mg | ORAL | Status: DC | PRN

## 2020-09-06 MED ORDER — DEXAMETHASONE SODIUM PHOSPHATE 10 MG/ML IJ SOLN
INTRAMUSCULAR | Status: DC | PRN
  Administered 2020-09-06: 10 mg

## 2020-09-06 MED ORDER — OXYCODONE HCL 5 MG/5ML PO SOLN: 5.0000 mg | Freq: Once | ORAL | Status: DC | PRN

## 2020-09-06 MED ORDER — PHENYLEPHRINE HCL-NACL 10-0.9 MG/250ML-% IV SOLN
INTRAVENOUS | Status: DC | PRN
  Administered 2020-09-06: 20 ug/min via INTRAVENOUS

## 2020-09-06 MED ORDER — FENTANYL CITRATE (PF) 100 MCG/2ML IJ SOLN
50.0000 ug | Freq: Once | INTRAMUSCULAR | Status: AC
  Administered 2020-09-06: 100 ug via INTRAVENOUS
  Filled 2020-09-06: qty 2

## 2020-09-06 MED ORDER — GLYCOPYRROLATE PF 0.2 MG/ML IJ SOSY
PREFILLED_SYRINGE | INTRAMUSCULAR | Status: AC
  Filled 2020-09-06: qty 1

## 2020-09-06 MED ORDER — POVIDONE-IODINE 10 % EX SWAB
2.0000 "application " | Freq: Once | CUTANEOUS | Status: AC
  Administered 2020-09-06: 2 via TOPICAL

## 2020-09-06 MED ORDER — ONDANSETRON HCL 4 MG/2ML IJ SOLN
INTRAMUSCULAR | Status: AC
  Filled 2020-09-06: qty 2

## 2020-09-06 MED ORDER — 0.9 % SODIUM CHLORIDE (POUR BTL) OPTIME
TOPICAL | Status: DC | PRN
  Administered 2020-09-06: 1000 mL

## 2020-09-06 MED ORDER — ORAL CARE MOUTH RINSE: 15.0000 mL | Freq: Once | OROMUCOSAL | Status: AC

## 2020-09-06 MED ORDER — SODIUM CHLORIDE 0.9 % IR SOLN
Status: DC | PRN
  Administered 2020-09-06: 1000 mL

## 2020-09-06 MED ORDER — FENTANYL CITRATE (PF) 250 MCG/5ML IJ SOLN
INTRAMUSCULAR | Status: AC
  Filled 2020-09-06: qty 5

## 2020-09-06 MED ORDER — MIDAZOLAM HCL 2 MG/2ML IJ SOLN
1.0000 mg | INTRAMUSCULAR | Status: DC
  Administered 2020-09-06: 2 mg via INTRAVENOUS
  Filled 2020-09-06: qty 2

## 2020-09-06 MED ORDER — ONDANSETRON HCL 4 MG/2ML IJ SOLN: 4.0000 mg | Freq: Once | INTRAMUSCULAR | Status: DC | PRN

## 2020-09-06 SURGICAL SUPPLY — 54 items
ANCH SUT 2 VT CRKSW 14.7X5.5 (Anchor) ×1 IMPLANT
ANCHOR CORKSCREW BIO 5.5 (Anchor) ×1 IMPLANT
ATTUNE PSRP INSR SZ8 8 KNEE (Insert) ×1 IMPLANT
BAG SPEC THK2 15X12 ZIP CLS (MISCELLANEOUS) ×1
BAG ZIPLOCK 12X15 (MISCELLANEOUS) ×2 IMPLANT
BIO-TENODESIS DISP KIT ×1 IMPLANT
BLADE SURG SZ10 CARB STEEL (BLADE) ×4 IMPLANT
BNDG ELASTIC 4X5.8 VLCR STR LF (GAUZE/BANDAGES/DRESSINGS) ×2 IMPLANT
BNDG ELASTIC 6X5.8 VLCR STR LF (GAUZE/BANDAGES/DRESSINGS) ×2 IMPLANT
BOOTIES KNEE HIGH SLOAN (MISCELLANEOUS) ×2 IMPLANT
COVER WAND RF STERILE (DRAPES) IMPLANT
CUFF TOURN SGL QUICK 34 (TOURNIQUET CUFF) ×2
CUFF TRNQT CYL 34X4.125X (TOURNIQUET CUFF) ×1 IMPLANT
DECANTER SPIKE VIAL GLASS SM (MISCELLANEOUS) IMPLANT
DRAPE ORTHO SPLIT 77X108 STRL (DRAPES) ×4
DRAPE SURG ORHT 6 SPLT 77X108 (DRAPES) ×2 IMPLANT
DRAPE U-SHAPE 47X51 STRL (DRAPES) ×2 IMPLANT
DRSG ADAPTIC 3X8 NADH LF (GAUZE/BANDAGES/DRESSINGS) ×2 IMPLANT
DRSG AQUACEL AG ADV 3.5X10 (GAUZE/BANDAGES/DRESSINGS) ×1 IMPLANT
DRSG PAD ABDOMINAL 8X10 ST (GAUZE/BANDAGES/DRESSINGS) ×2 IMPLANT
ELECT REM PT RETURN 15FT ADLT (MISCELLANEOUS) ×2 IMPLANT
EVACUATOR 1/8 PVC DRAIN (DRAIN) ×2 IMPLANT
GLOVE SRG 8 PF TXTR STRL LF DI (GLOVE) ×2 IMPLANT
GLOVE SURG ENC MOIS LTX SZ8 (GLOVE) ×4 IMPLANT
GLOVE SURG UNDER POLY LF SZ8 (GLOVE) ×4
GOWN STRL REUS W/TWL XL LVL3 (GOWN DISPOSABLE) ×4 IMPLANT
HANDPIECE INTERPULSE COAX TIP (DISPOSABLE) ×2
HOLDER FOLEY CATH W/STRAP (MISCELLANEOUS) IMPLANT
IMMOBILIZER KNEE 20 (SOFTGOODS)
IMMOBILIZER KNEE 20 THIGH 36 (SOFTGOODS) IMPLANT
KIT BASIN OR (CUSTOM PROCEDURE TRAY) ×2 IMPLANT
KIT BIO-TENODESIS 3X8 DISP (MISCELLANEOUS) ×2
KIT INSRT BABSR STRL DISP BTN (MISCELLANEOUS) IMPLANT
KIT TURNOVER KIT A (KITS) ×2 IMPLANT
NDL SAFETY ECLIPSE 18X1.5 (NEEDLE) IMPLANT
NEEDLE HYPO 18GX1.5 SHARP (NEEDLE)
NS IRRIG 1000ML POUR BTL (IV SOLUTION) ×2 IMPLANT
PACK TOTAL KNEE CUSTOM (KITS) IMPLANT
PAD CAST 4YDX4 CTTN HI CHSV (CAST SUPPLIES) ×2 IMPLANT
PADDING CAST COTTON 4X4 STRL (CAST SUPPLIES) ×4
PADDING CAST COTTON 6X4 STRL (CAST SUPPLIES) ×4 IMPLANT
PENCIL SMOKE EVACUATOR (MISCELLANEOUS) IMPLANT
PROTECTOR NERVE ULNAR (MISCELLANEOUS) ×2 IMPLANT
SET HNDPC FAN SPRY TIP SCT (DISPOSABLE) ×1 IMPLANT
SPONGE LAP 18X18 RF (DISPOSABLE) ×2 IMPLANT
STAPLER VISISTAT 35W (STAPLE) IMPLANT
SUT ETHIBOND NAB CT1 #1 30IN (SUTURE) ×4 IMPLANT
SUT VIC AB 0 CT1 36 (SUTURE) ×2 IMPLANT
SUT VIC AB 2-0 CT1 27 (SUTURE) ×2
SUT VIC AB 2-0 CT1 TAPERPNT 27 (SUTURE) ×1 IMPLANT
SUT VICRYL AB 3-0 FS1 BRD 27IN (SUTURE) ×2 IMPLANT
SYR 3ML LL SCALE MARK (SYRINGE) IMPLANT
TRAY FOLEY MTR SLVR 16FR STAT (SET/KITS/TRAYS/PACK) ×2 IMPLANT
WATER STERILE IRR 1000ML POUR (IV SOLUTION) ×2 IMPLANT

## 2020-09-06 NOTE — Progress Notes (Signed)
AssistedDr. Oddono with right, ultrasound guided, adductor canal block. Side rails up, monitors on throughout procedure. See vital signs in flow sheet. Tolerated Procedure well.  

## 2020-09-06 NOTE — Interval H&P Note (Signed)
History and Physical Interval Note:  09/06/2020 2:07 PM  Melinda [name]  has presented today for surgery, with the diagnosis of PAINFUL RIGHT KNEE REPLACEMENT.  The various methods of treatment have been discussed with the patient and family. After consideration of risks, benefits and other options for treatment, the patient has consented to  Procedure(s) with comments: RIGHT TOTAL KNEE REVISION (Right) - PLAN TO DISCHARGE FROM PACU as a surgical intervention.  The patient's history has been reviewed, patient examined, no change in status, stable for surgery.  I have reviewed the patient's chart and labs.  Questions were answered to the patient's satisfaction.     Melinda [name]

## 2020-09-06 NOTE — Anesthesia Preprocedure Evaluation (Addendum)
Anesthesia Evaluation  Patient identified by MRN, date of birth, ID band Patient awake    Reviewed: Allergy & Precautions, H&P , NPO status , Patient's Chart, lab work & pertinent test results  Airway Mallampati: II  TM Distance: >3 FB Neck ROM: Full    Dental no notable dental hx. (+) Teeth Intact, Dental Advisory Given   Pulmonary sleep apnea and Continuous Positive Airway Pressure Ventilation , former smoker,    Pulmonary exam normal breath sounds clear to auscultation       Cardiovascular Exercise Tolerance: Good hypertension, + CAD  Normal cardiovascular exam+ dysrhythmias  Rhythm:Regular Rate:Normal  1021/21 Echo  1. Left ventricular ejection fraction, by visual estimation, is 60 to  65%. The left ventricle has normal function. Normal left ventricular size.  There is no left ventricular hypertrophy. Normal wall motion. Normal  diastolic function.    Neuro/Psych Anxiety Depression  Neuromuscular disease    GI/Hepatic Neg liver ROS,   Endo/Other  diabetes, Well Controlled, Type 2  Renal/GU Renal diseaseK+ 4.6 Cr 0.97     Musculoskeletal  (+) Arthritis , Fibromyalgia -  Abdominal (+) + obese,   Peds  Hematology Hgb 12.6 plt 189   Anesthesia Other Findings I have reviewed both CT and MRI scans to determine best placement of spinal to avoid intrathecal infusion pump and catheter.    Reproductive/Obstetrics                            Anesthesia Physical  Anesthesia Plan  ASA: III  Anesthesia Plan: Spinal and MAC   Post-op Pain Management:  Regional for Post-op pain   Induction:   PONV Risk Score and Plan: 3 and Treatment may vary due to age or medical condition, Ondansetron and Midazolam  Airway Management Planned: Nasal Cannula and Natural Airway  Additional Equipment: None  Intra-op Plan:   Post-operative Plan:   Informed Consent: I have reviewed the patients History  and Physical, chart, labs and discussed the procedure including the risks, benefits and alternatives for the proposed anesthesia with the patient or authorized representative who has indicated his/her understanding and acceptance.     Dental advisory given  Plan Discussed with: CRNA and Anesthesiologist  Anesthesia Plan Comments: (See PAT note 05/18/2020, Konrad Felix, PA-C  Spinal w R adductor canal)        Anesthesia Quick Evaluation

## 2020-09-06 NOTE — Progress Notes (Signed)
Patient ambulated around the PACU and to the restroom using walker. No complaints of dizziness/pain. Patient planning to go home tonight.

## 2020-09-06 NOTE — Anesthesia Procedure Notes (Signed)
Spinal  Patient location during procedure: OR Start time: 09/06/2020 3:00 PM End time: 09/06/2020 3:05 PM Staffing Anesthesiologist: Janeece Riggers, MD Preanesthetic Checklist Completed: patient identified, IV checked, site marked, risks and benefits discussed, surgical consent, monitors and equipment checked, pre-op evaluation and timeout performed Spinal Block Patient position: sitting Prep: DuraPrep Patient monitoring: heart rate, cardiac monitor, continuous pulse ox and blood pressure Approach: midline Location: L4-5 Injection technique: single-shot Needle Needle type: Sprotte  Needle gauge: 24 G Needle length: 9 cm Assessment Sensory level: T4

## 2020-09-06 NOTE — Transfer of Care (Signed)
Immediate Anesthesia Transfer of Care Note  Patient: Melinda [name]  Procedure(s) Performed: RIGHT KNEE POLY REVISION (Right Knee)  Patient Location: PACU  Anesthesia Type:Spinal  Level of Consciousness: awake, alert , oriented and patient cooperative  Airway & Oxygen Therapy: Patient Spontanous Breathing  Post-op Assessment: Report given to RN and Post -op Vital signs reviewed and stable  Post vital signs: Reviewed and stable  Last Vitals:  Vitals Value Taken Time  BP 165/90 09/06/20 1655  Temp    Pulse 74 09/06/20 1658  Resp 13 09/06/20 1658  SpO2 91 % 09/06/20 1658  Vitals shown include unvalidated device data.  Last Pain:  Vitals:   09/06/20 1337  TempSrc: Oral  PainSc:          Complications: No complications documented.

## 2020-09-06 NOTE — Progress Notes (Signed)
Surgical site bled through dressing. Dressing peeled back to reveal site. Still bleeding minimally. Nida PA made aware and ordered to apply pressure dressing (gauze, abd, Ace). Patient can go home and come to the office in the morning if further issues.

## 2020-09-06 NOTE — Telephone Encounter (Signed)
Opened in error

## 2020-09-06 NOTE — Brief Op Note (Signed)
09/06/2020   Melinda [name] [id number] 09/06/2020   PRE-OP DIAGNOSIS: unstable right TKR  POST-OP DIAGNOSIS: same  PROCEDURE: Right TKR revision  ANESTHESIA: spinal and block  [name]   Dictation #:  [id number]

## 2020-09-06 NOTE — Anesthesia Procedure Notes (Addendum)
Anesthesia Regional Block: Adductor canal block   Pre-Anesthetic Checklist: ,, timeout performed, Correct Patient, Correct Site, Correct Laterality, Correct Procedure, Correct Position, site marked, Risks and benefits discussed,  Surgical consent,  Pre-op evaluation,  At surgeon's request and post-op pain management  Laterality: Right  Prep: chloraprep       Needles:  Injection technique: Single-shot  Needle Type: Echogenic Stimulator Needle     Needle Length: 5cm  Needle Gauge: 22     Additional Needles:   Procedures:, nerve stimulator,,, ultrasound used (permanent image in chart),,,,  Narrative:  Start time: 09/06/2020 2:34 PM End time: 09/06/2020 2:40 PM Injection made incrementally with aspirations every 5 mL.  Performed by: Personally  Anesthesiologist: Janeece Riggers, MD  Additional Notes: Functioning IV was confirmed and monitors were applied.  A 20mm 22ga Arrow echogenic stimulator needle was used. Sterile prep and drape,hand hygiene and sterile gloves were used. Ultrasound guidance: relevant anatomy identified, needle position confirmed, local anesthetic spread visualized around nerve(s)., vascular puncture avoided.  Image printed for medical record. Negative aspiration and negative test dose prior to incremental administration of local anesthetic. The patient tolerated the procedure well.

## 2020-09-07 ENCOUNTER — Encounter (HOSPITAL_COMMUNITY): Payer: Self-pay | Admitting: Orthopaedic Surgery

## 2020-09-07 NOTE — Anesthesia Postprocedure Evaluation (Signed)
Anesthesia Post Note  Patient: Melinda [name]  Procedure(s) Performed: RIGHT KNEE POLY REVISION (Right Knee)     Patient location during evaluation: PACU Anesthesia Type: MAC and Spinal Level of consciousness: oriented and awake and alert Pain management: pain level controlled Vital Signs Assessment: post-procedure vital signs reviewed and stable Respiratory status: spontaneous breathing, respiratory function stable and patient connected to nasal cannula oxygen Cardiovascular status: blood pressure returned to baseline and stable Postop Assessment: no headache, no backache and no apparent nausea or vomiting Anesthetic complications: no   No complications documented.  Last Vitals:  Vitals:   09/06/20 1915 09/06/20 1936  BP: (!) 166/78 (!) 157/82  Pulse:  76  Resp: 18 18  Temp:  36.8 C  SpO2:  96%    Last Pain:  Vitals:   09/06/20 1915  TempSrc:   PainSc: 0-No pain                 Falcon Mccaskey

## 2020-09-07 NOTE — Op Note (Signed)
NAMEHEELA, HEISHMAN MEDICAL RECORD NO: [id number] ACCOUNT NO: [id number] DATE OF BIRTH: May 27, 1956 FACILITY: [facility] LOCATION: [facility] PHYSICIAN: Monico Blitz. Rhona Raider, MD  Operative Report   DATE OF PROCEDURE: 09/06/2020  PREOPERATIVE DIAGNOSIS:  Unstable right total knee replacement.  POSTOPERATIVE DIAGNOSIS:  Unstable right total knee replacement.  PROCEDURE:  Right knee replacement, 1 component revision.  ANESTHESIA:  Spinal and sedation.  ATTENDING SURGEON:  Monico Blitz. Rhona Raider, MD.  ASSISTANT:  Loni Dolly, PA.  INDICATIONS:  The patient is a 65 year old woman many months out from her right knee replacement.  She initially did well, although she developed some instability and physical therapy with medial and lateral motions.  She is felt to have some laxity in  both flexion and extension on exam.  Her components appear well placed in an appropriate alignment.  She is offered a poly exchange 1 component revision.  Informed operative consent was obtained after discussion of possible complications including  reaction to anesthesia, infection, neurovascular injury.  SUMMARY OF FINDINGS AND PROCEDURE:  Under spinal anesthesia and sedation through her old incision, we performed a 1 component revision.  I removed her 5 mm poly and replaced it with an 8 mm Attune poly.  This seemed to give [place] good stability.  The medial  portion of her patellar tendon became partially detached during the procedure and we elected to supplement this with a couple of sutures.  We placed a 5.5 mm corkscrew anchor by Arthrex and used the 2 emanating FiberWire sutures to pass in [place]  fashion up along that border of the tendon.  Loni Dolly, assisted throughout and was invaluable to the completion of the case, mostly in that he helped retract to make the case possible.  He also closed simultaneously to help minimize OR time.  DESCRIPTION OF PROCEDURE:  The patient was taken to the operating suite where the  aforementioned anesthetics were applied.  She was positioned supine and prepped and draped in normal sterile fashion.  After the administration of preoperative IV Kefzol  and appropriate timeout, the right leg was elevated, exsanguinated and a tourniquet inflated about the thigh.  We used her old incision with dissection down to the extensor mechanism retinaculum.  I made a medial parapatellar incision.  The kneecap was  very scarred in and I really could not flip it easily.  We detached a little portion of the medial border, which allowed [place] to slide the patella with flexion.  Unfortunately, this led to detachment of about half of the patellar tendon along the medial  border.  I was able to remove the existing 5 mm poly.  We then trialed with an 8, which seemed to give [place] good stability in varus and valgus stress.  We subsequently placed the Attune 8 mm poly.  The wound was then thoroughly irrigated with pulsatile  lavage followed by release of the tourniquet.  A small amount of bleeding was easily controlled with some cautery and pressure and TXA topical.  I then placed the 5.5 mm corkscrew anchor near the tibial tubercle and ran the two emanating FiberWire  sutures through the tendon in [place] fashion, further supporting the tendon, which again was 50% detached.  The lateral half remained completely intact.  We then reapproximated the extensor mechanism with #1 Ethibond in interrupted fashion.  I flexed  her knee up to 90 degrees and everything seemed very stable at closure.  We then reapproximated subcutaneous tissues with 0 and 2-0 undyed Vicryl, followed by subcuticular stitch.  Adaptic was applied followed by a sterile postoperative dressing.   Estimated blood loss and intraoperative fluids can be obtained from anesthesia records asking accurate tourniquet time.  DISPOSITION:  The patient was taken to recovery room in stable condition.  Plans were for her to potentially go home same day versus  staying overnight.   PUS D: 09/06/2020 4:40:11 pm T: 09/07/2020 1:51:00 am  JOB: [id number]/ [id number]

## 2020-09-07 NOTE — Discharge Summary (Signed)
Patient ID: Melinda [name] MRN: [id number] DOB/AGE: [birth year] 63 y.o.  Admit date: 09/06/2020 Discharge date: 09/06/2020  Admission Diagnoses:  Active Problems:   Recurrent right knee instability   Discharge Diagnoses:  Same  Past Medical History:  Diagnosis Date  . 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 Amputation of second toe, right, traumatic ([place]) 09/2017  . Anginal pain ([place])   . Anxiety   . Blue toes    2nd toe on right foot, will get appt.  . Bulging disc   . CAD (coronary artery disease) 2009   s/p stent to LAD  . Cardiac arrhythmia due to congenital heart disease 1992   WPW.  Ablations done.  Now has rare episodes  . Chicken pox   . Chronic fatigue   . Chronic kidney disease    "problem with kidney filtration"  . Coronary arteritis   . Degenerative disc disease    Back, neck, hands, knees  . Depression   . Diabetes mellitus without complication ([place])   . Facet joint disease   . Fibromyalgia   . Heart disease   . Hyperlipidemia   . IBS (irritable bowel syndrome)   . MCL deficiency, knee   . MRSA (methicillin resistant staph aureus) culture positive 2011   GREAT TOE RIGHT FOOT  . Neuropathy 04/18/2010  . Orthostatic hypotension 01/2020  . Peripheral neuropathy   . Renal insufficiency   . Restless leg syndrome   . Sepsis ([place]) 09/2013  . Sleep apnea 08/17/2003   uses CPAP, sleep study at [facility] (mild to moderate)  . Spinal stenosis     Surgeries: Procedure(s): RIGHT KNEE POLY REVISION on 09/06/2020   Consultants:   Discharged Condition: Improved  Hospital Course: Kiriana Worthington is an 65 y.o. female who was admitted 09/06/2020 for operative treatment of<principal problem not specified>. Patient has severe unremitting pain that affects sleep, daily activities, and work/hobbies. After pre-op clearance the patient was taken to the operating room on 09/06/2020 and underwent  Procedure(s): [place].    Patient  was given perioperative antibiotics:  Anti-infectives (From admission, onward)   Start     Dose/Rate Route Frequency Ordered Stop   09/06/20 1300  ceFAZolin (ANCEF) IVPB 2g/100 mL premix        2 g 200 mL/hr over 30 Minutes Intravenous On call to O.R. 09/06/20 1251 09/06/20 1503       Patient was given sequential compression devices, early ambulation, and chemoprophylaxis to prevent DVT.  Patient benefited maximally from hospital stay and there were no complications.    Recent vital signs:  Patient Vitals for the past 24 hrs:  BP Temp Pulse Resp SpO2  09/06/20 1936 (!) 157/82 98.3 F (36.8 C) 76 18 96 %  09/06/20 1915 (!) 166/78 - - 18 -  09/06/20 1838 (!) 182/79 97.7 F (36.5 C) 79 16 95 %  09/06/20 1745 (!) 165/92 97.7 F (36.5 C) 68 12 95 %  09/06/20 1730 (!) 178/100 - 70 10 95 %  09/06/20 1715 (!) 183/89 - 70 14 100 %  09/06/20 1700 (!) 171/83 - 69 12 100 %  09/06/20 1655 - 97.9 F (36.6 C) 69 10 97 %     Recent laboratory studies:  Recent Labs    09/06/20 1330  WBC 6.3  HGB 12.4  HCT 38.4  PLT 199  NA 139  K 4.5  CL 107  CO2 25  BUN 38*  CREATININE 1.04*  GLUCOSE 113*  INR 1.1  CALCIUM 9.2     Discharge Medications:   Allergies as of 09/06/2020   No Known Allergies     Medication List    TAKE these medications   Accu-Chek Aviva Plus test strip Generic drug: glucose blood USE TO CHECK BLOOD SUGAR ONE TIME A DAY   Accu-Chek Softclix Lancets lancets USE TO CHECK BLOOD SUGAR 1 TIME DAILY   AMBULATORY NON FORMULARY MEDICATION Medication Name: CPAP MASK OF CHOICE FOR HOME DEVICE   aspirin 81 MG tablet Take 1 tablet (81 mg total) by mouth 2 (two) times daily after a meal. What changed: when to take this   b complex vitamins tablet Take 1 tablet by mouth in the morning.   Biotin 10000 MCG Tabs Take 10,000 mcg by mouth daily.   buPROPion 150 MG 24 hr tablet Commonly known as: WELLBUTRIN XL TAKE 1 TABLET BY MOUTH EVERY DAY    CALCIUM-MAGNESIUM-ZINC PO Take 3 capsules by mouth daily.   CINNAMON PO Take 1,000 mg by mouth daily.   Fish Oil 1200 MG Caps Take 1,200 mg by mouth in the morning.   fluticasone 50 MCG/ACT nasal spray Commonly known as: FLONASE Place 2 sprays into both nostrils daily as needed for allergies.   glucosamine-chondroitin 500-400 MG tablet Take 2 tablets by mouth daily.   Magnesium 400 MG Caps Take 400 mg by mouth at bedtime.   naloxone 2 MG/2ML injection Commonly known as: NARCAN Inject 1 mL (1 mg total) into the muscle as needed for up to 2 doses (for opioid overdose). In case of emergency (overdose), inject into muscle of upper arm or leg and call 911.   nitroGLYCERIN 0.4 MG SL tablet Commonly known as: NITROSTAT Place 1 tablet (0.4 mg total) under the tongue every 5 (five) minutes as needed for chest pain. Please keep upcoming appt in October. Thank you   NONFORMULARY OR COMPOUNDED ITEM 212.8 mcg by Epidural Infusion route. Medtronic Neuromodulation pump  Fentanyl 1,000.0 mcg/ml Baclofen 240.0 mcg/ml Bupivacaine 20.0 mg/ml Total daily dose 269.6 mcg/day   OVER THE COUNTER MEDICATION Take 1 capsule by mouth daily. Schigandra Plus   oxyCODONE-acetaminophen 5-325 MG tablet Commonly known as: Percocet Take 1 tablet by mouth every 6 (six) hours as needed for severe pain (in addition to baseline pain meds for post op pain). What changed:   when to take this  reasons to take this  Another medication with the same name was removed. Continue taking this medication, and follow the directions you see here.   oxyCODONE-acetaminophen 5-325 MG tablet Commonly known as: Percocet Take 1 tablet by mouth every 8 (eight) hours as needed for severe pain. Start taking on: September 25, 2020 What changed:   Another medication with the same name was changed. Make sure you understand how and when to take each.  Another medication with the same name was removed. Continue taking this  medication, and follow the directions you see here.   PARoxetine 20 MG tablet Commonly known as: PAXIL TAKE 0.5 TABLET BY MOUTH DAILY IN THE MORNING What changed:   how much to take  how to take this  when to take this   polyethylene glycol 17 g packet Commonly known as: MIRALAX / GLYCOLAX Take 17 g by mouth daily.   pregabalin 150 MG capsule Commonly known as: LYRICA Take 1 capsule (150 mg total) by mouth 3 (three) times daily.   rosuvastatin 20 MG tablet Commonly known as: CRESTOR TAKE 1 TABLET BY MOUTH EVERY DAY  tiZANidine 4 MG tablet Commonly known as: Zanaflex Take 1 tablet (4 mg total) by mouth every 8 (eight) hours as needed for muscle spasms.   vitamin C 1000 MG tablet Take 1,000 mg by mouth daily.   Vitamin D 50 MCG (2000 UT) tablet Take 2,000 Units by mouth daily.   vitamin E 180 MG (400 UNITS) capsule Take 400 Units by mouth daily.       Diagnostic Studies: VAS [place] LOWER EXTREMITY VENOUS (DVT)  Result Date: 08/15/2020  Lower Venous DVT Study Indications: Pain, Swelling, and post total knee replacement.  Limitations: Poor ultrasound/tissue interface proximal calf. Comparison Study: None Performing Technologist: Ivan Croft  Examination Guidelines: A complete evaluation includes B-mode imaging, spectral Doppler, color Doppler, and power Doppler as needed of all accessible portions of each vessel. Bilateral testing is considered an integral part of a complete examination. Limited examinations for reoccurring indications may be performed as noted. The reflux portion of the exam is performed with the patient in reverse Trendelenburg.  +---------+---------------+---------+-----------+----------+--------------+ RIGHT    CompressibilityPhasicitySpontaneityPropertiesThrombus Aging +---------+---------------+---------+-----------+----------+--------------+ CFV      Full           Yes      Yes                                  +---------+---------------+---------+-----------+----------+--------------+ SFJ      Full                                                        +---------+---------------+---------+-----------+----------+--------------+ FV Prox  Full                                                        +---------+---------------+---------+-----------+----------+--------------+ FV Mid   Full           Yes      Yes                                 +---------+---------------+---------+-----------+----------+--------------+ FV DistalFull                                                        +---------+---------------+---------+-----------+----------+--------------+ PFV      Full                                                        +---------+---------------+---------+-----------+----------+--------------+ POP      Full           Yes      Yes                                 +---------+---------------+---------+-----------+----------+--------------+ PTV  Full                                                        +---------+---------------+---------+-----------+----------+--------------+ PERO     Full                                                        +---------+---------------+---------+-----------+----------+--------------+ Gastroc  Full                                                        +---------+---------------+---------+-----------+----------+--------------+ GSV      Full                                                        +---------+---------------+---------+-----------+----------+--------------+ SSV      Full                                                        +---------+---------------+---------+-----------+----------+--------------+   +----+---------------+---------+-----------+----------+--------------+ LEFTCompressibilityPhasicitySpontaneityPropertiesThrombus Aging  +----+---------------+---------+-----------+----------+--------------+ CFV Full           Yes      Yes                                 +----+---------------+---------+-----------+----------+--------------+     Summary: RIGHT: - Portions of this examination were limited- limited visibility in proximal calf. - There is no evidence of deep vein thrombosis in the lower extremity. - There is no evidence of superficial venous thrombosis.  - No cystic structure found in the popliteal fossa.  - Results were given to [facility] at Dr. Jerald Kief office (08/15/2020)  LEFT: - No evidence of common femoral vein obstruction.  *See table(s) above for measurements and observations. Electronically signed by Harold Barban MD on 08/15/2020 at 5:22:50 PM.    Final     Disposition:   Discharge Instructions    Call MD / Call 911   Complete by: As directed    If you experience chest pain or shortness of breath, CALL 911 and be transported to the hospital emergency room.  If you develope a fever above 101 F, pus (white drainage) or increased drainage or redness at the wound, or calf pain, call your surgeon's office.   Constipation Prevention   Complete by: As directed    Drink plenty of fluids.  Prune juice may be helpful.  You may use a stool softener, such as Colace (over the counter) 100 mg twice a day.  Use MiraLax (over the counter) for constipation as needed.   Diet - low sodium heart healthy   Complete by: As directed    Discharge instructions  Complete by: As directed    Ice, elevate, weightbearing as tolerated. Must wear knee immobilizer when up. Keep bandage clean and dry until follow up. Follow up in office 2 weeks post op.   Increase activity slowly as tolerated   Complete by: As directed        Follow-up Information    Melrose Nakayama, MD. Schedule an appointment as soon as possible for a visit in 2 weeks.   Specialty: Orthopedic Surgery Contact information: [place]  [address] [phone]                Signed: Larwance Sachs Nida 09/07/2020, 3:55 PM

## 2020-09-12 ENCOUNTER — Encounter: Payer: Self-pay | Admitting: Internal Medicine

## 2020-09-14 ENCOUNTER — Encounter: Payer: Self-pay | Admitting: Pain Medicine

## 2020-09-15 MED FILL — Medication: INTRATHECAL | Qty: 1 | Status: AC

## 2020-09-19 MED ORDER — ACCU-CHEK GUIDE W/DEVICE KIT: 1.0000 | PACK | Freq: Once | 0 refills | Status: AC

## 2020-09-19 MED ORDER — ACCU-CHEK GUIDE CONTROL VI LIQD: 1.0000 | 1 refills | Status: AC | PRN

## 2020-09-19 MED ORDER — ACCU-CHEK GUIDE VI STRP: 1.0000 | ORAL_STRIP | Freq: Three times a day (TID) | 1 refills | Status: DC

## 2020-09-19 NOTE — Addendum Note (Signed)
Addended by: Lurlean Nanny on: 09/19/2020 12:08 PM   Modules accepted: Orders

## 2020-09-20 ENCOUNTER — Other Ambulatory Visit: Payer: Medicare Other

## 2020-09-23 ENCOUNTER — Other Ambulatory Visit: Payer: Medicare Other

## 2020-09-27 ENCOUNTER — Ambulatory Visit: Admit: 2020-09-27 | Payer: Medicare Other | Admitting: Pain Medicine

## 2020-09-27 SURGERY — CHEMO RESERVOIR INSERTION
Anesthesia: Monitor Anesthesia Care

## 2020-10-10 ENCOUNTER — Telehealth: Payer: Self-pay | Admitting: *Deleted

## 2020-10-10 ENCOUNTER — Other Ambulatory Visit: Payer: Self-pay | Admitting: Pain Medicine

## 2020-10-10 DIAGNOSIS — M96843 Postprocedural seroma of a musculoskeletal structure following other procedure: Secondary | ICD-10-CM | POA: Insufficient documentation

## 2020-10-10 DIAGNOSIS — T85610S Breakdown (mechanical) of epidural and subdural infusion catheter, sequela: Secondary | ICD-10-CM

## 2020-10-10 NOTE — Progress Notes (Signed)
Today I will be sending a referral to Dr. Deetta Perla for radiation of intrathecal pump reservoir with reanchoring of the device and any other treatments necessary to eliminate abdominal wall seroma.

## 2020-10-10 NOTE — Telephone Encounter (Signed)
Patient called regarding pump revision. Notified her that Dr. Dossie Arbour was no longer going to the OR for cases. He has referred her to DR. Cook at [place] [facility]. Informed her to notify of when she has a date and we will work to coordinate her refill. Verbalizes understanding.

## 2020-10-11 ENCOUNTER — Other Ambulatory Visit: Payer: Self-pay

## 2020-10-11 ENCOUNTER — Telehealth: Payer: Self-pay

## 2020-10-11 MED ORDER — PAIN MANAGEMENT IT PUMP REFILL: 1.0000 | Freq: Once | INTRATHECAL | 0 refills | Status: AC

## 2020-10-11 NOTE — Telephone Encounter (Signed)
Pump refill changed to 11/08/20 at 1300 due to Dr. Consuela Mimes going on vacation.

## 2020-10-11 NOTE — Telephone Encounter (Signed)
done

## 2020-10-21 ENCOUNTER — Telehealth: Payer: Self-pay

## 2020-10-21 NOTE — Telephone Encounter (Signed)
Patient recently had an at home Hepatitis C test on 10/08/2020 and it was non-reactive.

## 2020-10-22 NOTE — Telephone Encounter (Signed)
Noted, I doubt this will count toward the quality metrics though.

## 2020-10-27 ENCOUNTER — Telehealth: Payer: Self-pay | Admitting: Pain Medicine

## 2020-10-27 NOTE — Telephone Encounter (Signed)
Voicemail left with patient that I am not sure that I can help her with increased pain, that she will probably need an appt with [name].  However, I will be in the office tomorrow and if she would like to call and talk to me about it then to please do so.

## 2020-10-27 NOTE — Telephone Encounter (Signed)
Patient would like to speak with a nurse about some additional pain she is having.  She has to go to PT right now and wont be available until 3:30 I told her if we didn't reach her today someone would call her tomorrow.

## 2020-10-28 ENCOUNTER — Other Ambulatory Visit: Payer: Self-pay | Admitting: Internal Medicine

## 2020-10-28 DIAGNOSIS — G8929 Other chronic pain: Secondary | ICD-10-CM

## 2020-10-28 DIAGNOSIS — M7918 Myalgia, other site: Secondary | ICD-10-CM

## 2020-10-29 ENCOUNTER — Other Ambulatory Visit: Payer: Self-pay | Admitting: Internal Medicine

## 2020-10-29 DIAGNOSIS — M797 Fibromyalgia: Secondary | ICD-10-CM

## 2020-10-31 ENCOUNTER — Telehealth: Payer: Self-pay

## 2020-10-31 NOTE — Telephone Encounter (Signed)
Pt would like a nurse to contact her about her pain and meds

## 2020-11-01 ENCOUNTER — Other Ambulatory Visit: Payer: Self-pay

## 2020-11-01 DIAGNOSIS — M797 Fibromyalgia: Secondary | ICD-10-CM

## 2020-11-01 DIAGNOSIS — M7918 Myalgia, other site: Secondary | ICD-10-CM

## 2020-11-01 DIAGNOSIS — G8929 Other chronic pain: Secondary | ICD-10-CM

## 2020-11-01 MED ORDER — PREGABALIN 150 MG PO CAPS: 150.0000 mg | ORAL_CAPSULE | Freq: Three times a day (TID) | ORAL | 2 refills | Status: DC

## 2020-11-01 MED ORDER — TIZANIDINE HCL 4 MG PO TABS: 4.0000 mg | ORAL_TABLET | Freq: Three times a day (TID) | ORAL | 2 refills | Status: DC | PRN

## 2020-11-01 MED ORDER — PAROXETINE HCL 20 MG PO TABS: ORAL_TABLET | ORAL | 1 refills | Status: DC

## 2020-11-01 NOTE — Telephone Encounter (Signed)
Bubble and phone message sent to [name].

## 2020-11-01 NOTE — Telephone Encounter (Signed)
  Notes to clinic: Patient is requesting that a month supply be sent into pharmacy and she will contact office to schedule appt next week once she returns from out of town.  Please advise   Requested Prescriptions  Pending Prescriptions Disp Refills   pregabalin (LYRICA) 150 MG capsule 90 capsule 2    Sig: Take 1 capsule (150 mg total) by mouth 3 (three) times daily.      Not Delegated - Neurology:  Anticonvulsants - Controlled Failed - 11/01/2020 12:58 PM      Failed - This refill cannot be delegated      Failed - Valid encounter within last 12 months    Recent Outpatient Visits   None                tiZANidine (ZANAFLEX) 4 MG tablet 90 tablet 2    Sig: Take 1 tablet (4 mg total) by mouth every 8 (eight) hours as needed for muscle spasms.      Not Delegated - Cardiovascular:  Alpha-2 Agonists - tizanidine Failed - 11/01/2020 12:58 PM      Failed - This refill cannot be delegated      Failed - Valid encounter within last 6 months    Recent Outpatient Visits   None                PARoxetine (PAXIL) 20 MG tablet 45 tablet 1    Sig: TAKE 0.5 TABLET BY MOUTH DAILY IN THE MORNING      Psychiatry:  Antidepressants - SSRI Failed - 11/01/2020 12:58 PM      Failed - Valid encounter within last 6 months    Recent Outpatient Visits   None              Passed - Completed PHQ-2 or PHQ-9 in the last 360 days

## 2020-11-01 NOTE — Telephone Encounter (Signed)
Patient has called and states she is having increased pain when she coughs, sneezes etc in her  butt cheek bilaterally .  She also is having increased pain in her neck after cervical fusion.  Is thinking that possibly she needs another MRI just to reevaluate.  States the left hand is sometimes going numb, sometimes the whole hand and this has started recently.  She also feels that the scoliosis has gotten worse or changed in some way and is having great difficulty resting in the evening or getting comfortable while sitting, driving at other times.  She has an appt for pump refill on 11/08/20 and would like [name] to be aware of this prior to that appt so they can then discuss.

## 2020-11-08 ENCOUNTER — Ambulatory Visit
Admission: RE | Admit: 2020-11-08 | Discharge: 2020-11-08 | Disposition: A | Discharge status: Home / Self Care | Payer: Medicare Other | Source: Ambulatory Visit | Attending: Pain Medicine | Admitting: Pain Medicine

## 2020-11-08 ENCOUNTER — Other Ambulatory Visit: Payer: Self-pay

## 2020-11-08 ENCOUNTER — Encounter: Payer: Self-pay | Admitting: Pain Medicine

## 2020-11-08 ENCOUNTER — Ambulatory Visit (HOSPITAL_BASED_OUTPATIENT_CLINIC_OR_DEPARTMENT_OTHER): Payer: Medicare Other | Admitting: Pain Medicine

## 2020-11-08 ENCOUNTER — Telehealth: Payer: Self-pay | Admitting: *Deleted

## 2020-11-08 VITALS — BP 140/70 | HR 84 | Temp 97.2°F | Resp 16 | Ht 68.0 in | Wt 220.0 lb

## 2020-11-08 DIAGNOSIS — T85620D Displacement of epidural and subdural infusion catheter, subsequent encounter: Secondary | ICD-10-CM

## 2020-11-08 DIAGNOSIS — M5136 Other intervertebral disc degeneration, lumbar region: Secondary | ICD-10-CM

## 2020-11-08 DIAGNOSIS — Z79891 Long term (current) use of opiate analgesic: Secondary | ICD-10-CM | POA: Diagnosis not present

## 2020-11-08 DIAGNOSIS — M542 Cervicalgia: Secondary | ICD-10-CM

## 2020-11-08 DIAGNOSIS — M79645 Pain in left finger(s): Secondary | ICD-10-CM | POA: Insufficient documentation

## 2020-11-08 DIAGNOSIS — G96198 Other disorders of meninges, not elsewhere classified: Secondary | ICD-10-CM | POA: Insufficient documentation

## 2020-11-08 DIAGNOSIS — G894 Chronic pain syndrome: Secondary | ICD-10-CM | POA: Insufficient documentation

## 2020-11-08 DIAGNOSIS — Z79899 Other long term (current) drug therapy: Secondary | ICD-10-CM

## 2020-11-08 DIAGNOSIS — G8929 Other chronic pain: Secondary | ICD-10-CM

## 2020-11-08 DIAGNOSIS — M5137 Other intervertebral disc degeneration, lumbosacral region: Secondary | ICD-10-CM | POA: Diagnosis not present

## 2020-11-08 DIAGNOSIS — Z955 Presence of coronary angioplasty implant and graft: Secondary | ICD-10-CM | POA: Diagnosis not present

## 2020-11-08 DIAGNOSIS — M961 Postlaminectomy syndrome, not elsewhere classified: Secondary | ICD-10-CM | POA: Insufficient documentation

## 2020-11-08 DIAGNOSIS — Z978 Presence of other specified devices: Secondary | ICD-10-CM

## 2020-11-08 DIAGNOSIS — M5441 Lumbago with sciatica, right side: Secondary | ICD-10-CM

## 2020-11-08 DIAGNOSIS — R200 Anesthesia of skin: Secondary | ICD-10-CM | POA: Insufficient documentation

## 2020-11-08 DIAGNOSIS — M25561 Pain in right knee: Secondary | ICD-10-CM | POA: Diagnosis not present

## 2020-11-08 DIAGNOSIS — M47812 Spondylosis without myelopathy or radiculopathy, cervical region: Secondary | ICD-10-CM

## 2020-11-08 DIAGNOSIS — M419 Scoliosis, unspecified: Secondary | ICD-10-CM | POA: Insufficient documentation

## 2020-11-08 DIAGNOSIS — M5030 Other cervical disc degeneration, unspecified cervical region: Secondary | ICD-10-CM | POA: Insufficient documentation

## 2020-11-08 MED ORDER — OXYCODONE-ACETAMINOPHEN 5-325 MG PO TABS: 1.0000 | ORAL_TABLET | Freq: Three times a day (TID) | ORAL | 0 refills | Status: DC | PRN

## 2020-11-08 MED ORDER — OXYCODONE-ACETAMINOPHEN 5-325 MG PO TABS
1.0000 | ORAL_TABLET | Freq: Three times a day (TID) | ORAL | 0 refills | Status: DC | PRN
Start: 2020-12-29 — End: 2020-12-21

## 2020-11-08 NOTE — Progress Notes (Signed)
PROVIDER NOTE: Information contained herein reflects review and annotations entered in association with encounter. Interpretation of such information and data should be left to medically-trained personnel. Information provided to patient can be located elsewhere in the medical record under "Patient Instructions". Document created using STT-dictation technology, any transcriptional errors that may result from process are unintentional.    Patient: Melinda [name]  Service Category: Procedure  Provider: Oswaldo Done, MD  DOB: [birth year]  DOS: 11/08/2020  Location: [facility]  MRN: [id number]  Setting: Ambulatory - outpatient  Referring Provider: Lorre Munroe, NP  Type: Established Patient  Specialty: Interventional Pain Management  PCP: Lorre Munroe, NP   Primary Reason for Visit: Interventional Pain Management Treatment. CC: Knee Pain (right), Back Pain, and Neck Pain  Procedure:          Intrathecal Drug Delivery System (IDDS):  Type: Reservoir Refill (39672) No rate change Region: Abdominal Laterality: Right   Plan: The patient is being sent to Dr. Adriana Simas for pump replacement.  If it takes a while to get her into the system, we may change her medication concentration to half of what she is currently having in the pump since we are having a significant amount of residual.  The plan is to cut the concentration of all 3 medications by 50%.  Type of Pump: Medtronic Synchromed II Delivery Route: Intrathecal Type of Pain Treated: Neuropathic/Nociceptive Primary Medication Class: Opioid/opiate  Medication, Concentration, Infusion Program, & Delivery Rate: Please see scanned programming printout.   Indications: 1. Chronic pain syndrome   2. Failed back surgical syndrome   3. Chronic low back pain (Bilateral) (L>R)   4. Chronic low back pain (Bilateral) w/o sciatica   5. DDD (degenerative disc disease), lumbosacral   6. Epidural fibrosis   7. Pharmacologic therapy   8.  Presence of intrathecal pump (Medtronic intrathecal programmable pump) (40 mL pump)   9. Chronic use of opiate for therapeutic purpose   10. Malposition of intrathecal infusion catheter, subsequent encounter    Pain Assessment: Self-Reported Pain Score: 5 /10             Reported level is compatible with observation.         The patient indicates that she has been having a lot more pain in the cervical region towards the left side with numbness affecting her left thumb, index finger, and middle finger and what appears to be at least a C6/C7 dermatomal distribution.  She has a prior history of cervical spine surgery and she indicates that her symptoms have been worsening.  She has also been complaining of pain in the mid upper back.  She also has a history of scoliosis.  And increasing her low back pain and lower extremity pain on the right side.  The patient in addition has a history of prior lumbar spinal surgery.  Fluoroscopy had to be used today to access the pump due to the device having flipped.  Today we removed a significant amount of volume out of the pump and therefore I believe that she would do better if we cut the concentration of the pump and have so asked to increase delivery.  Even by programming, the pump indicates that she should have had approximately 18 mL left.  The patient is pending to be seen by Dr. Adriana Simas for revision and possible replacement of the pump.  On the pump's current location, she has developed a seroma that has not gone away and keeps recurring.  In addition she has had problems with the pump flipping.  This pump in particular is about 63 years old.  At this point, we were concerned about the possibility of this continuing to recur.  The pump has already been revised by the ER General implanter as well as myself where the same type of result.  At this point, we are hoping that we can get the neurosurgeon to reimplant the device, perhaps with a new pump and a new catheter  since or time she has had excellent results with the device.  Prior pumps had not given her any problems and it was not until this last replacement that she started having issues with it.  Currently the pump is flipping around and I am concerned that this may pull the catheter out of the intrathecal space.  Not only that, with a catheter is currently not directly behind the pump and therefore there is always the possibility that it can be damaged during a refill.  Pharmacotherapy Assessment  Analgesic: Oxycodone 5 mg, 1 tab PO q 8 hrs (15 mg/day of oxycodone) + Intrathecal PF-Fentanyl MME/day: 22.5 mg/day (oral).   RTCB: 02/27/2021  Monitoring: [place] PMP: PDMP reviewed during this encounter.       Pharmacotherapy: No side-effects or adverse reactions reported. Compliance: No problems identified. Effectiveness: Clinically acceptable. Plan: Refer to "POC".  UDS:  Summary  Date Value Ref Range Status  07/24/2018 FINAL  Final    Comment:    ==================================================================== TOXASSURE SELECT 13 (MW) ==================================================================== Test                             Result       Flag       Units Drug Present and Declared for Prescription Verification   Oxycodone                      754          EXPECTED   ng/mg creat   Oxymorphone                    231          EXPECTED   ng/mg creat   Noroxycodone                   2463         EXPECTED   ng/mg creat   Noroxymorphone                 89           EXPECTED   ng/mg creat    Sources of oxycodone are scheduled prescription medications.    Oxymorphone, noroxycodone, and noroxymorphone are expected    metabolites of oxycodone. Oxymorphone is also available as a    scheduled prescription medication. Drug Present not Declared for Prescription Verification   Fentanyl                       4            UNEXPECTED ng/mg creat   Norfentanyl                    29           UNEXPECTED  ng/mg creat    Source of fentanyl is a scheduled prescription medication,    including IV, patch, and transmucosal formulations. Norfentanyl  is an expected metabolite of fentanyl. ==================================================================== Test                      Result    Flag   Units      Ref Range   Creatinine              134              mg/dL      >=20 ==================================================================== Declared Medications:  The flagging and interpretation on this report are based on the  following declared medications.  Unexpected results may arise from  inaccuracies in the declared medications.  **Note: The testing scope of this panel includes these medications:  Oxycodone  **Note: The testing scope of this panel does not include following  reported medications:  Aspirin (Aspirin 81)  Bupropion (Wellbutrin)  Chondroitin (Glucosamine-Chondroitin)  Cinnamon  Glipizide (Glucotrol)  Glucosamine (Glucosamine-Chondroitin)  Magnesium  Multivitamin  Naloxone  Nitroglycerin (Nitrostat)  Paroxetine  Polyethylene Glycol (GlycoLAX)  Polyethylene Glycol (MiraLAX)  Pregabalin (Lyrica)  Rosuvastatin (Crestor)  Supplement (Omega-3)  Tizanidine (Zanaflex)  Turmeric  Ubiquinone (Coenzyme Q 10)  Vitamin D2 (Drisdol)  Vitamin E ==================================================================== For clinical consultation, please call [phone]. ====================================================================     Intrathecal Pump Therapy Assessment  Manufacturer: Medtronic Synchromed Type: Programmable Volume: 40 mL reservoir MRI compatibility: Yes   Drug content:  Primary Medication Class:Opioid Primary Medication:PF-Fentanyl(1040mcg/mL) Secondary Medication:PF-Bupivacaine(20mg /mL) Other Medication:PF-Baclofen (269mcg/mL)   Programming:  Type: Simple continuous. See pump readout for details.   Changes:  Medication  Change: None at this point Rate Change: No change in rate  Reported side-effects or adverse reactions: None reported  Effectiveness: Described as relatively effective, allowing for increase in activities of daily living (ADL) Clinically meaningful improvement in function (CMIF): Sustained CMIF goals met  Plan: Pump refill today  Pre-op H&P Assessment:  Melinda [name] is a 65 y.o. (year old), female patient, seen today for interventional treatment. 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 Melinda [name] has a current medication list which includes the following prescription(s): accu-chek aviva plus, accu-chek softclix lancets, AMBULATORY NON FORMULARY MEDICATION, vitamin c, aspirin, b complex vitamins, biotin, accu-chek guide control, bupropion, calcium-magnesium-zinc, vitamin d, cinnamon, fluticasone, glucosamine-chondroitin, accu-chek guide, magnesium, naloxone, nitroglycerin, NONFORMULARY OR COMPOUNDED ITEM, fish oil, OVER THE COUNTER MEDICATION, paroxetine, polyethylene glycol, pregabalin, rosuvastatin, tizanidine, vitamin e, [START ON 11/29/2020]  oxycodone-acetaminophen, [START ON 12/29/2020] oxycodone-acetaminophen, and [START ON 01/28/2021] oxycodone-acetaminophen. Her primarily concern today is the Knee Pain (right), Back Pain, and Neck Pain  Initial Vital Signs:  Pulse/HCG Rate: 84  Temp: (!) 97.2 F (36.2 C) Resp: 16 BP: 140/70 SpO2: 98 %  BMI: Estimated body mass index is 33.45 kg/m as calculated from the following:   Height as of this encounter: 5\' 8"  (1.727 m).   Weight as of this encounter: 220 lb (99.8 kg).  Risk Assessment: Allergies: Reviewed.  She has No Known Allergies.  Allergy Precautions: None required Coagulopathies: Reviewed. None identified.  Blood-thinner therapy: None at this time Active Infection(s): Reviewed. None identified. Melinda [name] is afebrile  Site Confirmation: Melinda [name] was asked to confirm the procedure and laterality before marking the site Procedure checklist: Completed Consent: Before the procedure and under the influence of no sedative(s), amnesic(s), or anxiolytics, the patient was informed of the treatment options, risks and possible complications. To fulfill our ethical and legal obligations, as recommended by the American Medical Association's Code of Ethics, I have informed the patient of my clinical impression; the nature and purpose of the treatment or procedure; the risks, benefits, and possible complications of the intervention; the alternatives, including doing nothing; the risk(s) and benefit(s) of the alternative treatment(s) or procedure(s); and the risk(s) and benefit(s) of doing nothing.  Melinda [name] was provided with information about the general risks and possible complications associated with most interventional procedures. These include, but are not limited to: failure to achieve desired goals, infection, bleeding, organ or nerve damage, allergic reactions, paralysis, and/or death.  In addition, she was informed of those risks and possible complications associated to this  particular procedure, which include, but are not limited to: damage to the implant; failure to decrease pain; local, systemic, or serious CNS infections, intraspinal abscess with possible cord compression and paralysis, or life-threatening such as meningitis; bleeding; organ damage; nerve injury or damage with subsequent sensory, motor, and/or autonomic system dysfunction, resulting in transient or permanent pain, numbness, and/or weakness of one or several areas of the body; allergic reactions, either minor or major life-threatening, such as anaphylactic or anaphylactoid reactions.  Furthermore, Melinda [name] was informed of those risks and complications associated with the medications. These include, but are not limited to: allergic reactions (i.e.: anaphylactic or anaphylactoid reactions); endorphine suppression; bradycardia and/or hypotension; water retention and/or peripheral vascular relaxation leading to lower extremity edema and possible stasis ulcers; respiratory depression and/or shortness of breath; decreased metabolic rate leading to weight gain; swelling or edema; medication-induced neural toxicity; particulate matter embolism and blood vessel occlusion with resultant organ, and/or nervous system infarction; and/or intrathecal granuloma formation with possible spinal cord compression and permanent paralysis.  Before refilling the pump Melinda [name] was informed that some of the medications used in the devise may not be FDA approved for such use and therefore it constitutes an off-label use of the medications.  Finally, she was informed that Medicine is not an exact science; therefore, there is also the possibility of unforeseen or unpredictable risks and/or possible complications that may result in a catastrophic outcome. The patient indicated having understood very clearly. We have given the patient no guarantees and we have made no promises. Enough time was given to the patient to ask questions, all  of which were answered to the patient's satisfaction. Melinda [name] has indicated that she wanted to continue with the procedure. Attestation: I, the ordering provider, attest that I have discussed with the patient the benefits, risks, side-effects, alternatives, likelihood of achieving goals, and potential problems during recovery for the procedure that I have provided informed consent. Date  Time: 11/08/2020  1:08 PM  Pre-Procedure Preparation:  Monitoring: As per clinic protocol. Respiration, ETCO2, SpO2, BP, heart rate and rhythm monitor placed and checked for adequate function Safety Precautions: Patient was assessed for positional comfort and pressure points before starting the procedure. Time-out: I initiated and conducted the "Time-out" before starting the procedure, as per protocol. The patient was asked to participate by  confirming the accuracy of the "Time Out" information. Verification of the correct person, site, and procedure were performed and confirmed by me, the nursing staff, and the patient. "Time-out" conducted as per Joint Commission's Universal Protocol (UP.01.01.01). Time: 1310  Description of Procedure:          Position: Supine Target Area: Central-port of intrathecal pump. Approach: Anterior, 90 degree angle approach. Area Prepped: Entire Area around the pump implant. DuraPrep (Iodine Povacrylex [0.7% available iodine] and Isopropyl Alcohol, 74% w/w) Safety Precautions: Aspiration looking for blood return was conducted prior to all injections. At no point did we inject any substances, as a needle was being advanced. No attempts were made at seeking any paresthesias. Safe injection practices and needle disposal techniques used. Medications properly checked for expiration dates. SDV (single dose vial) medications used. Description of the Procedure: Protocol guidelines were followed. Two nurses trained to do implant refills were present during the entire procedure. The refill  medication was checked by both healthcare providers as well as the patient. The patient was included in the "Time-out" to verify the medication. The patient was placed in position. The pump was identified. The area was prepped in the usual manner. The sterile template was positioned over the pump, making sure the side-port location matched that of the pump. Both, the pump and the template were held for stability. The needle provided in the Medtronic Kit was then introduced thru the center of the template and into the central port. The pump content was aspirated and discarded volume documented. The new medication was slowly infused into the pump, thru the filter, making sure to avoid overpressure of the device. The needle was then removed and the area cleansed, making sure to leave some of the prepping solution back to take advantage of its long term bactericidal properties. The pump was interrogated and programmed to reflect the correct medication, volume, and dosage. The program was printed and taken to the physician for approval. Once checked and signed by the physician, a copy was provided to the patient and another scanned into the EMR.  Vitals:   11/08/20 1304  BP: 140/70  Pulse: 84  Resp: 16  Temp: (!) 97.2 F (36.2 C)  TempSrc: Temporal  SpO2: 98%  Weight: 220 lb (99.8 kg)  Height: $Remove'5\' 8"'BWivNtT$  (1.727 m)    Start Time: 1340 hrs. End Time: 1400 hrs. Materials & Medications: Medtronic Refill Kit Medication(s): Please see chart orders for details.  Imaging Guidance:          Type of Imaging Technique: Fluoroscopy Guidance (Non-spinal) Indication(s): Migration of intrathecal pump with difficulty accessing central port for refill. Exposure Time: Please see nurses notes. Contrast: None used. Fluoroscopic Guidance: I was personally present during the use of fluoroscopy. "Tunnel Vision Technique" used to obtain the best possible view of the target area. Parallax error corrected before commencing the  procedure. "Direction-depth-direction" technique used to introduce the needle under continuous pulsed fluoroscopy. Once target was reached, antero-posterior, oblique, and lateral fluoroscopic projection used confirm needle placement in all planes. Images permanently stored in EMR. Ultrasound Guidance: N/A Interpretation: No contrast injected. I personally interpreted the imaging intraoperatively. Adequate needle placement confirmed in multiple planes. Permanent images saved into the patient's record.  Antibiotic Prophylaxis:   Anti-infectives (From admission, onward)   None     Indication(s): None identified  Post-operative Assessment:  Post-procedure Vital Signs:  Pulse/HCG Rate: 84  Temp: (!) 97.2 F (36.2 C) Resp: 16 BP: 140/70 SpO2: 98 %  EBL: None  Complications: No immediate post-treatment complications observed by team, or reported by patient.  Note: The patient tolerated the entire procedure well. A repeat set of vitals were taken after the procedure and the patient was kept under observation following institutional policy, for this type of procedure. Post-procedural neurological assessment was performed, showing return to baseline, prior to discharge. The patient was provided with post-procedure discharge instructions, including a section on how to identify potential problems. Should any problems arise concerning this procedure, the patient was given instructions to immediately contact us, at any time, without hesitation. In any case, we plan to contact the patient by telephone for a follow-up status report regarding this interventional procedure.  Comments:  No additional relevant information.  Plan of Care  Orders:  Orders Placed This Encounter  Procedures  . PUMP REFILL    Maintain Protocol by having two(2) healthcare providers during procedure and programming.    Scheduling Instructions:     Please refill intrathecal pump today.    Order Specific Question:   Where  will this procedure be performed?    Answer:   [facility]  . PUMP REFILL    Whenever possible schedule on a procedure today.    Standing Status:   Future    Standing Expiration Date:   04/07/2021    Scheduling Instructions:     Please schedule intrathecal pump refill based on pump programming. Avoid schedule intervals of more than 120 days (4 months).    Order Specific Question:   Where will this procedure be performed?    Answer:   [facility]  . DG PAIN CLINIC C-ARM 1-60 MIN NO REPORT    Intraoperative interpretation by procedural physician at [place].    Standing Status:   Standing    Number of Occurrences:   1    Order Specific Question:   Reason for exam:    Answer:   Assistance in needle guidance and placement for procedures requiring needle placement in or near specific anatomical locations not easily accessible without such assistance.  . MR CERVICAL SPINE WO CONTRAST    Patient presents with axial pain with possible radicular component.  In addition to any acute findings, please report on:  1. Facet (Zygapophyseal) joint DJD (Hypertrophy, space narrowing, subchondral sclerosis, and/or osteophyte formation) 2. DDD and/or IVDD (Loss of disc height, desiccation or "Black disc disease") 3. Pars defects 4. Spondylolisthesis, spondylosis, and/or spondyloarthropathies (include Degree/Grade of displacement in mm) 5. Vertebral body Fractures, including age (old, new/acute) 41. Modic Type Changes 7. Demineralization 8. Bone pathology 9. Central, Lateral Recess, and/or Foraminal Stenosis (include AP diameter of stenosis in mm) 10. Surgical changes (hardware type, status, and presence of fibrosis) NOTE: Please specify level(s) and laterality.    Standing Status:   Future    Standing Expiration Date:   12/09/2020    Scheduling Instructions:     Imaging must be done as soon as possible. Inform patient that order will expire within 30 days and I will not  renew it.    Order Specific Question:   What is the patient's sedation requirement?    Answer:   No Sedation    Order Specific Question:   Does the patient have a pacemaker or implanted devices?    Answer:   No    Order Specific Question:   Preferred imaging location?    Answer:   [facility] (table limit-350lbs)    Order Specific Question:   Call Results- Best Contact Number?  Answer:   [phone] [facility])    Order Specific Question:   Radiology Contrast Protocol - do NOT remove file path    Answer:   \\[email or web address]  . MR LUMBAR SPINE WO CONTRAST    Patient presents with axial pain with possible radicular component.  In addition to any acute findings, please report on:  1. Facet (Zygapophyseal) joint DJD (Hypertrophy, space narrowing, subchondral sclerosis, and/or osteophyte formation) 2. DDD and/or IVDD (Loss of disc height, desiccation or "Black disc disease") 3. Pars defects 4. Spondylolisthesis, spondylosis, and/or spondyloarthropathies (include Degree/Grade of displacement in mm) 5. Vertebral body Fractures, including age (old, new/acute) 6. Modic Type Changes 7. Demineralization 8. Bone pathology 9. Central, Lateral Recess, and/or Foraminal Stenosis (include AP diameter of stenosis in mm) 10. Surgical changes (hardware type, status, and presence of fibrosis)  NOTE: Please specify level(s) and laterality.    Standing Status:   Future    Standing Expiration Date:   12/09/2020    Scheduling Instructions:     Imaging must be done as soon as possible. Inform patient that order will expire within 30 days and I will not renew it.    Order Specific Question:   What is the patient's sedation requirement?    Answer:   No Sedation    Order Specific Question:   Does the patient have a pacemaker or implanted devices?    Answer:   No    Order Specific Question:   Preferred imaging location?    Answer:   [facility] (table  limit-350lbs)    Order Specific Question:   Call Results- Best Contact Number?    Answer:   [phone] [facility])    Order Specific Question:   Radiology Contrast Protocol - do NOT remove file path    Answer:   \\[email or web address]  . Informed Consent Details: Physician/Practitioner Attestation; Transcribe to consent form and obtain patient signature    Transcribe to consent form and obtain patient signature.    Order Specific Question:   Physician/Practitioner attestation of informed consent for procedure/surgical case    Answer:   I, the physician/practitioner, attest that I have discussed with the patient the benefits, risks, side effects, alternatives, likelihood of achieving goals and potential problems during recovery for the procedure that I have provided informed consent.    Order Specific Question:   Procedure    Answer:   Intrathecal pump refill    Order Specific Question:   Physician/Practitioner performing the procedure    Answer:   Attending Physician: Sydnee Levans. Laban Emperor, MD & designated trained staff    Order Specific Question:   Indication/Reason    Answer:   Chronic Pain Syndrome (G89.4), presence of an intrathecal pump (Z97.8)   Chronic Opioid Analgesic:  Oxycodone 5 mg, 1 tab PO q 8 hrs (15 mg/day of oxycodone) + Intrathecal PF-Fentanyl MME/day: 22.5 mg/day (oral).   Medications ordered for procedure: Meds ordered this encounter  Medications  . oxyCODONE-acetaminophen (PERCOCET) 5-325 MG tablet    Sig: Take 1 tablet by mouth every 8 (eight) hours as needed for severe pain.    Dispense:  90 tablet    Refill:  0    Not a duplicate. Do NOT delete! Dispense 1 day early if closed on refill date. Avoid benzodiazepines within 8 hours of opioids. Do not send refill requests.  Marland Kitchen oxyCODONE-acetaminophen (PERCOCET) 5-325 MG tablet    Sig: Take 1 tablet by mouth every 8 (eight) hours as needed for severe pain.  Dispense:  90 tablet     Refill:  0    Not a duplicate. Do NOT delete! Dispense 1 day early if closed on refill date. Avoid benzodiazepines within 8 hours of opioids. Do not send refill requests.  Marland Kitchen oxyCODONE-acetaminophen (PERCOCET) 5-325 MG tablet    Sig: Take 1 tablet by mouth every 8 (eight) hours as needed for severe pain.    Dispense:  90 tablet    Refill:  0    Not a duplicate. Do NOT delete! Dispense 1 day early if closed on refill date. Avoid benzodiazepines within 8 hours of opioids. Do not send refill requests.   Medications administered: Melinda [name] had no medications administered during this visit.  See the medical record for exact dosing, route, and time of administration.  Follow-up plan:   Return for Pump Refill (Max:51mo).       Interventional Therapies  Risk  Complexity Considerations:   Estimated body mass index is 33.45 kg/m as calculated from the following:   Height as of this encounter: $RemoveBeforeD'5\' 8"'[id number]$  (1.727 m).   Weight as of this encounter: 220 lb (99.8 kg). WNL   Planned  Pending:   Intrathecal pump pocket revision and reanchoring of device.   Under consideration:   Possible left lumbar facet RFA  Possible right lumbar facet RFA  Diagnostic caudal ESI + diagnostic epidurogram Possible Racz procedure Diagnostic bilateral IA knee injection (Steroid) Diagnostic bilateral genicular NB Possible bilateral genicular nerve RFA Possible bilateral Hyalgan knee injection DiagnosticcervicalESI Diagnostic bilateral cervical facet block Possible bilateral cervical facet RFA   Completed:   Diagnostic/therapeutic left L2-3 LESI x1 Diagnostic/therapeutic right lumbar facet block x3  Diagnostic/therapeutic left lumbar facet block x1    Therapeutic  Palliative (PRN) options:   Palliative/therapeutic intrathecal pump management (refills/programming adjustments)  Palliative left L2-3 LESI #2 Palliative right lumbar facet block #4  Diagnostic left lumbar facet block #2     Recent  Visits Date Type Provider Dept  08/23/20 Procedure visit [name], MD [facility]  Showing recent visits within past 90 days and meeting all other requirements Today's Visits Date Type Provider Dept  11/08/20 Procedure visit [name], MD [facility]  Showing today's visits and meeting all other requirements Future Appointments No visits were found meeting these conditions. Showing future appointments within next 90 days and meeting all other requirements  Disposition: Discharge home  Discharge (Date  Time): 11/08/2020; 1420 hrs.   Primary Care Physician: Jearld Fenton, NP Location: [facility] Note by: Gaspar Cola, MD Date: 11/08/2020; Time: 5:20 PM  Disclaimer:  Medicine is not an Chief Strategy Officer. The only guarantee in medicine is that nothing is guaranteed. It is important to note that the decision to proceed with this intervention was based on the information collected from the patient. The Data and conclusions were drawn from the patient's questionnaire, the interview, and the physical examination. Because the information was provided in large part by the patient, it cannot be guaranteed that it has not been purposely or unconsciously manipulated. Every effort has been made to obtain as much relevant data as possible for this evaluation. It is important to note that the conclusions that lead to this procedure are derived in large part from the available data. Always take into account that the treatment will also be dependent on availability of resources and existing treatment guidelines, considered by other Pain Management Practitioners as being common knowledge and practice, at the time of the intervention.  For Medico-Legal purposes, it is also important to point out that variation in procedural techniques and pharmacological choices are the acceptable norm. The indications, contraindications, technique, and results  of the above procedure should only be interpreted and judged by a Board-Certified Interventional Pain Specialist with extensive familiarity and expertise in the same exact procedure and technique.

## 2020-11-08 NOTE — Telephone Encounter (Signed)
All remaining scripts for Oxycodone/Acetaminophen discontinued.

## 2020-11-08 NOTE — Patient Instructions (Signed)
Opioid Overdose Opioids are drugs that are often used to treat pain. Opioids include illegal drugs, such as heroin, as well as prescription pain medicines, such as codeine, morphine, hydrocodone, oxycodone, and fentanyl. An opioid overdose happens when you take too much of an opioid. An overdose may be intentional or accidental and can happen with any type of opioid. The effects of an overdose can be mild, dangerous, or even deadly. Opioid overdose is a medical emergency. What are the causes? This condition may be caused by:  Taking too much of an opioid on purpose.  Taking too much of an opioid by accident.  Using two or more substances that contain opioids at the same time.  Taking an opioid with a substance that affects your heart, breathing, or blood pressure. These include alcohol, tranquilizers, sleeping pills, illegal drugs, and some over-the-counter medicines. This condition may also happen due to an error made by:  A health care provider who prescribes a medicine.  The pharmacist who fills the prescription order. What increases the risk? This condition is more likely in:  Children. They may be attracted to colorful pills. Because of a child's small size, even a small amount of a drug can be dangerous.  Older people. They may be taking many different drugs. Older people may have difficulty reading labels or remembering when they last took their medicine. They may also be more sensitive to the effects of opioids.  People with chronic medical conditions, especially heart, liver, kidney, or neurological diseases.  People who take an opioid for a long period of time.  People who use: ? Illegal drugs. IV heroin is especially dangerous. ? Other substances, including alcohol, while using an opioid.  People who have: ? A history of drug or alcohol abuse. ? Certain mental health conditions. ? A history of previous drug overdoses.  People who take opioids that are not prescribed  for them. What are the signs or symptoms? Symptoms of this condition depend on the type of opioid and the amount that was taken. Common symptoms include:  Sleepiness or difficulty waking from sleep.  Decrease in attention.  Confusion.  Slurred speech.  Slowed breathing and a slow pulse (bradycardia).  Nausea and vomiting.  Abnormally small pupils. Signs and symptoms that require emergency treatment include:  Cold, clammy, and pale skin.  Blue lips and fingernails.  Vomiting.  Gurgling sounds in the throat.  A pulse that is very slow or difficult to detect.  Breathing that is very irregular, slow, noisy, or difficult to detect.  Limp body.  Inability to respond to speech or be awakened from sleep (stupor).  Seizures. How is this diagnosed? This condition is diagnosed based on your symptoms and medical history. It is important to tell your health care provider:  About all of the opioids that you took.  When you took the opioids.  Whether you were drinking alcohol or using marijuana, cocaine, or other drugs. Your health care provider will do a physical exam. This exam may include:  Checking and monitoring your heart rate and rhythm, breathing rate, temperature, and blood pressure (vital signs).  Measuring oxygen levels in your blood.  Checking for abnormally small pupils. You may also have blood tests or urine tests. You may have X-rays if you are having severe breathing problems. How is this treated? This condition requires immediate medical treatment and hospitalization. Treatment is given in the hospital intensive care (ICU) setting. Supporting your vital signs and your breathing is the first step in   treating an opioid overdose. Treatment may also include:  Giving salts and minerals (electrolytes) along with fluids through an IV.  Inserting a breathing tube (endotracheal tube) in your airway to help you breathe if you cannot breathe on your own or you are in  danger of not being able to breathe on your own.  Giving oxygen through a small tube under your nose.  Passing a tube through your nose and into your stomach (nasogastric tube, or NG tube) to empty your stomach.  Giving medicines that: ? Increase your blood pressure. ? Relieve nausea and vomiting. ? Relieve abdominal pain and cramping. ? Reverse the effects of the opioid (naloxone).  Monitoring your heart and oxygen levels.  Ongoing counseling and mental health support if you intentionally overdosed or used an illegal drug. Follow these instructions at home: Medicines  Take over-the-counter and prescription medicines only as told by your health care provider.  Always ask your health care provider about possible side effects and interactions of any new medicine that you start taking.  Keep a list of all the medicines that you take, including over-the-counter medicines. Bring this list with you to all your medical visits. General instructions  Drink enough fluid to keep your urine pale yellow.  Keep all follow-up visits as told by your health care provider. This is important.   How is this prevented?  Read the drug inserts that come with your opioid pain medicines.  Take medicines only as told by your health care provider. Do not take more medicine than you are told. Do not take medicines more frequently than you are told.  Do not drink alcohol or take sedatives when taking opioids.  Do not use illegal or recreational drugs, including cocaine, ecstasy, and marijuana.  Do not take opioid medicines that are not prescribed for you.  Store all medicines in safety containers that are out of the reach of children.  Get help if you are struggling with: ? Alcohol or drug use. ? Depression or another mental health problem. ? Thoughts of hurting yourself or another person.  Keep the phone number of your local poison control center near your phone or in your mobile phone. In the  [place] the hotline of the National Poison Control Center is (800) 222-1222.  If you were prescribed naloxone, make sure you understand how to take it. Contact a health care provider if you:  Need help understanding how to take your pain medicines.  Feel your medicines are too strong.  Are concerned that your pain medicines are not working well for your pain.  Develop new symptoms or side effects when you are taking medicines. Get help right away if:  You or someone else is having symptoms of an opioid overdose. Get help even if you are not sure.  You have serious thoughts about hurting yourself or others.  You have: ? Chest pain. ? Difficulty breathing. ? A loss of consciousness. These symptoms may represent a serious problem that is an emergency. Do not wait to see if the symptoms will go away. Get medical help right away. Call your local emergency services (911 in the [place] Do not drive yourself to the hospital. If you ever feel like you may hurt yourself or others, or have thoughts about taking your own life, get help right away. You can go to your nearest emergency department or call:  Your local emergency services (911 in the [place]  A suicide crisis helpline, such as the National Suicide Prevention Lifeline   at 1-800-273-8255. This is open 24 hours a day. Summary  Opioids are drugs that are often used to treat pain. Opioids include illegal drugs, such as heroin, as well as prescription pain medicines.  An opioid overdose happens when you take too much of an opioid.  Overdoses can be intentional or accidental.  Opioid overdose is very dangerous. It is a life-threatening emergency.  If you or someone you know is experiencing an opioid overdose, get help right away. This information is not intended to replace advice given to you by your health care provider. Make sure you discuss any questions you have with your health care provider. Document Revised: 06/05/2018 Document  Reviewed: 06/05/2018 Elsevier Patient Education  2021 Elsevier Inc.  

## 2020-11-08 NOTE — Progress Notes (Signed)
Safety precautions to be maintained throughout the outpatient stay will include: orient to surroundings, keep bed in low position, maintain call bell within reach at all times, provide assistance with transfer out of bed and ambulation.  

## 2020-11-09 ENCOUNTER — Telehealth: Payer: Self-pay

## 2020-11-09 ENCOUNTER — Other Ambulatory Visit: Payer: Self-pay | Admitting: Internal Medicine

## 2020-11-09 NOTE — Telephone Encounter (Signed)
Post procedure phone call.  LM 

## 2020-11-11 MED FILL — Medication: INTRATHECAL | Qty: 1 | Status: AC

## 2020-11-19 ENCOUNTER — Other Ambulatory Visit: Payer: Self-pay

## 2020-11-19 ENCOUNTER — Ambulatory Visit
Admission: RE | Admit: 2020-11-19 | Discharge: 2020-11-19 | Disposition: A | Discharge status: Home / Self Care | Payer: Medicare Other | Source: Ambulatory Visit | Attending: Pain Medicine | Admitting: Pain Medicine

## 2020-11-19 DIAGNOSIS — M5441 Lumbago with sciatica, right side: Secondary | ICD-10-CM | POA: Diagnosis present

## 2020-11-19 DIAGNOSIS — Z978 Presence of other specified devices: Secondary | ICD-10-CM

## 2020-11-19 DIAGNOSIS — M5136 Other intervertebral disc degeneration, lumbar region: Secondary | ICD-10-CM | POA: Insufficient documentation

## 2020-11-19 DIAGNOSIS — T85620D Displacement of epidural and subdural infusion catheter, subsequent encounter: Secondary | ICD-10-CM

## 2020-11-19 DIAGNOSIS — G96198 Other disorders of meninges, not elsewhere classified: Secondary | ICD-10-CM | POA: Diagnosis present

## 2020-11-19 DIAGNOSIS — M5137 Other intervertebral disc degeneration, lumbosacral region: Secondary | ICD-10-CM | POA: Diagnosis present

## 2020-11-19 DIAGNOSIS — M542 Cervicalgia: Secondary | ICD-10-CM | POA: Insufficient documentation

## 2020-11-19 DIAGNOSIS — G8929 Other chronic pain: Secondary | ICD-10-CM | POA: Diagnosis present

## 2020-11-19 DIAGNOSIS — M47812 Spondylosis without myelopathy or radiculopathy, cervical region: Secondary | ICD-10-CM | POA: Insufficient documentation

## 2020-11-19 DIAGNOSIS — M5030 Other cervical disc degeneration, unspecified cervical region: Secondary | ICD-10-CM | POA: Diagnosis present

## 2020-11-19 DIAGNOSIS — M961 Postlaminectomy syndrome, not elsewhere classified: Secondary | ICD-10-CM | POA: Diagnosis present

## 2020-11-19 DIAGNOSIS — M545 Low back pain, unspecified: Secondary | ICD-10-CM | POA: Diagnosis present

## 2020-11-22 ENCOUNTER — Encounter: Payer: Medicare Other | Admitting: Pain Medicine

## 2020-12-10 ENCOUNTER — Encounter: Payer: Self-pay | Admitting: Family Medicine

## 2020-12-21 ENCOUNTER — Other Ambulatory Visit: Payer: Self-pay

## 2020-12-21 ENCOUNTER — Ambulatory Visit (INDEPENDENT_AMBULATORY_CARE_PROVIDER_SITE_OTHER): Payer: Medicare Other | Admitting: Internal Medicine

## 2020-12-21 ENCOUNTER — Encounter: Payer: Self-pay | Admitting: Internal Medicine

## 2020-12-21 VITALS — BP 99/52 | HR 66 | Temp 97.1°F | Resp 18 | Ht 68.0 in | Wt 233.6 lb

## 2020-12-21 DIAGNOSIS — M797 Fibromyalgia: Secondary | ICD-10-CM

## 2020-12-21 DIAGNOSIS — N183 Chronic kidney disease, stage 3 unspecified: Secondary | ICD-10-CM

## 2020-12-21 DIAGNOSIS — I2510 Atherosclerotic heart disease of native coronary artery without angina pectoris: Secondary | ICD-10-CM | POA: Diagnosis not present

## 2020-12-21 DIAGNOSIS — G4733 Obstructive sleep apnea (adult) (pediatric): Secondary | ICD-10-CM | POA: Diagnosis not present

## 2020-12-21 DIAGNOSIS — G894 Chronic pain syndrome: Secondary | ICD-10-CM

## 2020-12-21 DIAGNOSIS — E1141 Type 2 diabetes mellitus with diabetic mononeuropathy: Secondary | ICD-10-CM | POA: Diagnosis not present

## 2020-12-21 DIAGNOSIS — Z6835 Body mass index (BMI) 35.0-35.9, adult: Secondary | ICD-10-CM

## 2020-12-21 DIAGNOSIS — I158 Other secondary hypertension: Secondary | ICD-10-CM | POA: Diagnosis not present

## 2020-12-21 DIAGNOSIS — Z6831 Body mass index (BMI) 31.0-31.9, adult: Secondary | ICD-10-CM | POA: Insufficient documentation

## 2020-12-21 DIAGNOSIS — E6609 Other obesity due to excess calories: Secondary | ICD-10-CM | POA: Insufficient documentation

## 2020-12-21 DIAGNOSIS — E663 Overweight: Secondary | ICD-10-CM | POA: Insufficient documentation

## 2020-12-21 DIAGNOSIS — Z6828 Body mass index (BMI) 28.0-28.9, adult: Secondary | ICD-10-CM | POA: Insufficient documentation

## 2020-12-21 DIAGNOSIS — F418 Other specified anxiety disorders: Secondary | ICD-10-CM

## 2020-12-21 DIAGNOSIS — E7800 Pure hypercholesterolemia, unspecified: Secondary | ICD-10-CM

## 2020-12-21 DIAGNOSIS — I2583 Coronary atherosclerosis due to lipid rich plaque: Secondary | ICD-10-CM

## 2020-12-21 NOTE — Patient Instructions (Signed)
https://www.diabeteseducator.org/docs/default-source/living-with-diabetes/conquering-the-grocery-store-v1.pdf?sfvrsn=4">  Carbohydrate Counting for Diabetes Mellitus, Adult Carbohydrate counting is a method of keeping track of how many carbohydrates you eat. Eating carbohydrates naturally increases the amount of sugar (glucose) in the blood. Counting how many carbohydrates you eat improves your bloodglucose control, which helps you manage your diabetes. It is important to know how many carbohydrates you can safely have in each meal. This is different for every person. A dietitian can help you make a meal plan and calculate how many carbohydrates you should have at each meal andsnack. What foods contain carbohydrates? Carbohydrates are found in the following foods: Grains, such as breads and cereals. Dried beans and soy products. Starchy vegetables, such as potatoes, peas, and corn. Fruit and fruit juices. Milk and yogurt. Sweets and snack foods, such as cake, cookies, candy, chips, and soft drinks. How do I count carbohydrates in foods? There are two ways to count carbohydrates in food. You can read food labels or learn standard serving sizes of foods. You can use either of the methods or acombination of both. Using the Nutrition Facts label The Nutrition Facts list is included on the labels of almost all packaged foods and beverages in the [place] It includes: The serving size. Information about nutrients in each serving, including the grams (g) of carbohydrate per serving. To use the Nutrition Facts: Decide how many servings you will have. Multiply the number of servings by the number of carbohydrates per serving. The resulting number is the total amount of carbohydrates that you will be having. Learning the standard serving sizes of foods When you eat carbohydrate foods that are not packaged or do not include Nutrition Facts on the label, you need to measure the servings in order to count the  amount of carbohydrates. Measure the foods that you will eat with a food scale or measuring cup, if needed. Decide how many standard-size servings you will eat. Multiply the number of servings by 15. For foods that contain carbohydrates, one serving equals 15 g of carbohydrates. For example, if you eat 2 cups or 10 oz (300 g) of strawberries, you will have eaten 2 servings and 30 g of carbohydrates (2 servings x 15 g = 30 g). For foods that have more than one food mixed, such as soups and casseroles, you must count the carbohydrates in each food that is included. The following list contains standard serving sizes of common carbohydrate-rich foods. Each of these servings has about 15 g of carbohydrates: 1 slice of bread. 1 six-inch (15 cm) tortilla. ? cup or 2 oz (53 g) cooked rice or pasta.  cup or 3 oz (85 g) cooked or canned, drained and rinsed beans or lentils.  cup or 3 oz (85 g) starchy vegetable, such as peas, corn, or squash.  cup or 4 oz (120 g) hot cereal.  cup or 3 oz (85 g) boiled or mashed potatoes, or  or 3 oz (85 g) of a large baked potato.  cup or 4 fl oz (118 mL) fruit juice. 1 cup or 8 fl oz (237 mL) milk. 1 small or 4 oz (106 g) apple.  or 2 oz (63 g) of a medium banana. 1 cup or 5 oz (150 g) strawberries. 3 cups or 1 oz (24 g) popped popcorn. What is an example of carbohydrate counting? To calculate the number of carbohydrates in this sample meal, follow the stepsshown below. Sample meal 3 oz (85 g) chicken breast. ? cup or 4 oz (106 g) brown rice.    cup or 3 oz (85 g) corn. 1 cup or 8 fl oz (237 mL) milk. 1 cup or 5 oz (150 g) strawberries with sugar-free whipped topping. Carbohydrate calculation Identify the foods that contain carbohydrates: Rice. Corn. Milk. Strawberries. Calculate how many servings you have of each food: 2 servings rice. 1 serving corn. 1 serving milk. 1 serving strawberries. Multiply each number of servings by 15 g: 2 servings  rice x 15 g = 30 g. 1 serving corn x 15 g = 15 g. 1 serving milk x 15 g = 15 g. 1 serving strawberries x 15 g = 15 g. Add together all of the amounts to find the total grams of carbohydrates eaten: 30 g + 15 g + 15 g + 15 g = 75 g of carbohydrates total. What are tips for following this plan? Shopping Develop a meal plan and then make a shopping list. Buy fresh and frozen vegetables, fresh and frozen fruit, dairy, eggs, beans, lentils, and whole grains. Look at food labels. Choose foods that have more fiber and less sugar. Avoid processed foods and foods with added sugars. Meal planning Aim to have the same amount of carbohydrates at each meal and for each snack time. Plan to have regular, balanced meals and snacks. Where to find more information American Diabetes Association: www.diabetes.org Centers for Disease Control and Prevention: www.cdc.gov Summary Carbohydrate counting is a method of keeping track of how many carbohydrates you eat. Eating carbohydrates naturally increases the amount of sugar (glucose) in the blood. Counting how many carbohydrates you eat improves your blood glucose control, which helps you manage your diabetes. A dietitian can help you make a meal plan and calculate how many carbohydrates you should have at each meal and snack. This information is not intended to replace advice given to you by your health care provider. Make sure you discuss any questions you have with your healthcare provider. Document Revised: 06/18/2019 Document Reviewed: 06/19/2019 Elsevier Patient Education  2021 Elsevier Inc.  

## 2020-12-21 NOTE — Assessment & Plan Note (Signed)
C-Met and lipid profile today Continue rosuvastatin, co-Q10, fish oil and aspirin Encouraged her to consume a low-fat diet

## 2020-12-21 NOTE — Assessment & Plan Note (Signed)
Off antihypertensive secondary to low blood pressures C-Met today

## 2020-12-21 NOTE — Assessment & Plan Note (Signed)
C-Met and lipid profile today Continue rosuvastatin, co-Q10, fish oil and aspirin Encouraged her to consume a low-fat diet 

## 2020-12-21 NOTE — Assessment & Plan Note (Signed)
Treatment per pain management

## 2020-12-21 NOTE — Progress Notes (Signed)
Subjective:    Patient ID: Melinda [name], female    DOB: October 27, 1955, 65 y.o.   MRN: [id number]  HPI  Patient presents the clinic today for follow-up of chronic conditions.  OSA: She averages 5 to 6 hours of sleep per night with the use of her CPAP.  Sleep study from 05/2019 reviewed.  DM2 with Peripheral Neuropathy: Her last A1c was 6%, 08/2020.  She does not check her sugars routinely but they have been 140-170. She is not taking any oral diabetic medication at this time but is taking Pregabalin as prescribed.  Her last eye exam was 06/2020, [facility].  She checks her feet routinely.  Flu 01/2020.  Pneumovax 07/2015.  [place] x 3.  CKD 3: Her last GFR was > 60, 08/2020.  She is not currently on an ACEI/ARB.   She does not follow with nephrology.  HLD with CAD, Aortic Atherosclerosis: Her last LDL was 36, triglycerides 158, 03/2019.  She is taking Fish Oil, Co-Q10 and Rosuvastatin as prescribed.  She tries to consume a low-fat diet.  Chronic Pain/Fibromyalgia: Managed with an intrathecal pump.  She is taking Oxycodone and Tizanidine as prescribed by pain management.  Depression: Chronic but stable on Paroxetine and Wellbutrin.  She is not currently seeing a therapist.  She denies anxiety, SI/HI.  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   Bulging disc    CAD (coronary artery disease) 2009   s/p stent to LAD   Cardiac arrhythmia due to congenital heart disease 1992   WPW.  Ablations done.  Now has rare episodes   Chicken pox    Chronic fatigue    Chronic kidney disease    "problem with kidney filtration"   Coronary arteritis    Degenerative disc disease    Back, neck, hands, knees   Depression    Diabetes mellitus without complication (HCC)     Facet joint disease    Fibromyalgia    Heart disease    Hyperlipidemia    IBS (irritable bowel syndrome)    MCL deficiency, knee    MRSA (methicillin resistant staph aureus) culture positive 2011   GREAT TOE RIGHT FOOT   Neuropathy 04/18/2010   Orthostatic hypotension 01/2020   Peripheral neuropathy    Renal insufficiency    Restless leg syndrome    Sepsis ([place]) 09/2013   Sleep apnea 08/17/2003   uses CPAP, sleep study at [facility] (mild to moderate)   Spinal stenosis     Current Outpatient Medications  Medication Sig Dispense Refill   ACCU-CHEK AVIVA PLUS test strip USE TO CHECK BLOOD SUGAR ONE TIME A DAY 100 strip 5   Accu-Chek Softclix Lancets lancets USE TO CHECK BLOOD SUGAR 1 TIME DAILY 100 each 2   AMBULATORY NON FORMULARY MEDICATION Medication Name: CPAP MASK OF CHOICE FOR HOME DEVICE 1 each 0   Ascorbic Acid (VITAMIN C) 1000 MG tablet Take 1,000 mg by mouth daily.     aspirin 81 MG tablet Take 1 tablet (81 mg total) by mouth 2 (two) times daily after a meal. (Patient taking differently: Take 81 mg by mouth daily.) 30 tablet 0   b complex vitamins tablet Take 1  tablet by mouth in the morning.     Biotin 10000 MCG TABS Take 10,000 mcg by mouth daily.     Blood Glucose Calibration (ACCU-CHEK GUIDE CONTROL) LIQD 1 each by In Vitro route as needed. 1 each 1   buPROPion (WELLBUTRIN XL) 150 MG 24 hr tablet TAKE 1 TABLET BY MOUTH EVERY DAY 90 tablet 0   CALCIUM-MAGNESIUM-ZINC PO Take 3 capsules by mouth daily.     Cholecalciferol (VITAMIN D) 50 MCG (2000 UT) tablet Take 2,000 Units by mouth daily.     CINNAMON PO Take 1,000 mg by mouth daily.     fluticasone (FLONASE) 50 MCG/ACT nasal spray Place 2 sprays into both nostrils daily as needed for allergies.      glucosamine-chondroitin 500-400 MG tablet Take 2 tablets by mouth daily.      glucose blood (ACCU-CHEK GUIDE) test strip 1 each by Other route 3 (three) times daily. Use as instructe 200 each 1   Magnesium 400 MG CAPS  Take 400 mg by mouth at bedtime.     naloxone (NARCAN) 2 MG/2ML injection Inject 1 mL (1 mg total) into the muscle as needed for up to 2 doses (for opioid overdose). In case of emergency (overdose), inject into muscle of upper arm or leg and call 911. 2 mL 0   nitroGLYCERIN (NITROSTAT) 0.4 MG SL tablet Place 1 tablet (0.4 mg total) under the tongue every 5 (five) minutes as needed for chest pain. Please keep upcoming appt in October. Thank you 25 tablet 1   NONFORMULARY OR COMPOUNDED ITEM 212.8 mcg by Epidural Infusion route. Medtronic Neuromodulation pump  Fentanyl 1,000.0 mcg/ml Baclofen 240.0 mcg/ml Bupivacaine 20.0 mg/ml Total daily dose 269.6 mcg/day     Omega-3 Fatty Acids (FISH OIL) 1200 MG CAPS Take 1,200 mg by mouth in the morning.      OVER THE COUNTER MEDICATION Take 1 capsule by mouth daily. Schigandra Plus     oxyCODONE-acetaminophen (PERCOCET) 5-325 MG tablet Take 1 tablet by mouth every 8 (eight) hours as needed for severe pain. 90 tablet 0   [START ON 12/29/2020] oxyCODONE-acetaminophen (PERCOCET) 5-325 MG tablet Take 1 tablet by mouth every 8 (eight) hours as needed for severe pain. 90 tablet 0   [START ON 01/28/2021] oxyCODONE-acetaminophen (PERCOCET) 5-325 MG tablet Take 1 tablet by mouth every 8 (eight) hours as needed for severe pain. 90 tablet 0   PARoxetine (PAXIL) 20 MG tablet TAKE 0.5 TABLET BY MOUTH DAILY IN THE MORNING 45 tablet 1   polyethylene glycol (MIRALAX / GLYCOLAX) packet Take 17 g by mouth daily.     pregabalin (LYRICA) 150 MG capsule Take 1 capsule (150 mg total) by mouth 3 (three) times daily. 90 capsule 2   rosuvastatin (CRESTOR) 20 MG tablet TAKE 1 TABLET BY MOUTH EVERY DAY (Patient taking differently: Take 20 mg by mouth daily.) 90 tablet 3   tiZANidine (ZANAFLEX) 4 MG tablet Take 1 tablet (4 mg total) by mouth every 8 (eight) hours as needed for muscle spasms. 90 tablet 2   vitamin E 180 MG (400 UNITS) capsule Take 400 Units by mouth daily.     No current  facility-administered medications for this visit.    No Known Allergies  Family History  Problem Relation Age of Onset   Lung cancer Mother    Diabetes Father    Heart disease Maternal Grandfather    Diabetes Paternal Grandmother    Colon cancer Paternal Grandfather    Stroke Neg Hx  Social History   Socioeconomic History   Marital status: Significant Other    Spouse name: Not on file   Number of children: Not on file   Years of education: Not on file   Highest education level: Not on file  Occupational History   Not on file  Tobacco Use   Smoking status: Former    Packs/day: 1.50    Years: 32.00    Pack years: 48.00    Types: Cigarettes    Quit date: 07/22/2007    Years since quitting: 13.4   Smokeless tobacco: Never  Vaping Use   Vaping Use: Never used  Substance and Sexual Activity   Alcohol use: Not Currently    Alcohol/week: 0.0 standard drinks    Comment: occ - Holidays   Drug use: Yes    Comment: prescribed pain pump and oxy   Sexual activity: Yes  Other Topics Concern   Not on file  Social History Narrative   Lives at home with a partner. Independent at baseline   Social Determinants of Health   Financial Resource Strain: Not on file  Food Insecurity: Not on file  Transportation Needs: Not on file  Physical Activity: Not on file  Stress: Not on file  Social Connections: Not on file  Intimate Partner Violence: Not on file     Constitutional: Denies fever, malaise, fatigue, headache or abrupt weight changes.  HEENT: Denies eye pain, eye redness, ear pain, ringing in the ears, wax buildup, runny nose, nasal congestion, bloody nose, or sore throat. Respiratory: Denies difficulty breathing, shortness of breath, cough or sputum production.   Cardiovascular: Denies chest pain, chest tightness, palpitations or swelling in the hands or feet.  Gastrointestinal: Denies abdominal pain, bloating, constipation, diarrhea or blood in the stool.  GU: Denies  urgency, frequency, pain with urination, burning sensation, blood in urine, odor or discharge. Musculoskeletal: Patient reports chronic joint and muscle pain.  Denies decrease in range of motion, difficulty with gait, or joint swelling.  Skin: Denies redness, rashes, lesions or ulcercations.  Neurological: Patient reports neuropathic pain, difficulty with memory.  Denies dizziness, difficulty with speech or problems with balance and coordination.  Psych: Patient has a history of depression.  Denies anxiety, SI/HI.  No other specific complaints in a complete review of systems (except as listed in HPI above).  Objective:   Physical Exam  BP (!) 99/52 (BP Location: Left Arm, Patient Position: Sitting, Cuff Size: Large)   Pulse 66   Temp (!) 97.1 F (36.2 C) (Temporal)   Resp 18   Ht 5\' 8"  (1.727 m)   Wt 233 lb 9.6 oz (106 kg)   LMP  (LMP Unknown)   SpO2 99%   BMI 35.52 kg/m   Wt Readings from Last 3 Encounters:  11/08/20 220 lb (99.8 kg)  09/06/20 228 lb 6.3 oz (103.6 kg)  08/25/20 228 lb 8 oz (103.6 kg)    General: Appears her, stated age, obese, in NAD. Skin: Warm, dry and intact. No ulcerations noted. HEENT: Head: normal shape and size; Eyes: sclera white and EOMs intact;  Neck:  Neck supple, trachea midline. No masses, lumps or thyromegaly present.  Cardiovascular: Normal rate and rhythm. S1,S2 noted.  No murmur, rubs or gallops noted.  Trace BLE edema. No carotid bruits noted. Pulmonary/Chest: Normal effort and positive vesicular breath sounds. No respiratory distress. No wheezes, rales or ronchi noted.  Musculoskeletal: No difficulty with gait.  Neurological: Alert and oriented.  Psychiatric: Mood and affect mildly flat.  Behavior is normal. Judgment and thought content normal.    BMET    Component Value Date/Time   NA 139 09/06/2020 1330   NA 135 (L) 06/30/2013 1203   K 4.5 09/06/2020 1330   K 4.1 06/30/2013 1203   CL 107 09/06/2020 1330   CL 102 06/30/2013 1203    CO2 25 09/06/2020 1330   CO2 32 06/30/2013 1203   GLUCOSE 113 (H) 09/06/2020 1330   GLUCOSE 143 (H) 06/30/2013 1203   BUN 38 (H) 09/06/2020 1330   BUN 19 (H) 06/30/2013 1203   CREATININE 1.04 (H) 09/06/2020 1330   CREATININE 0.89 06/30/2013 1203   CALCIUM 9.2 09/06/2020 1330   CALCIUM 9.7 06/30/2013 1203   GFRNONAA >60 09/06/2020 1330   GFRNONAA >60 06/30/2013 1203   GFRAA >60 04/02/2020 1041   GFRAA >60 06/30/2013 1203    Lipid Panel     Component Value Date/Time   CHOL 102 03/12/2019 1255   TRIG 158.0 (H) 03/12/2019 1255   HDL 34.30 (L) 03/12/2019 1255   CHOLHDL 3 03/12/2019 1255   VLDL 31.6 03/12/2019 1255   LDLCALC 36 03/12/2019 1255    CBC    Component Value Date/Time   WBC 6.3 09/06/2020 1330   RBC 4.42 09/06/2020 1330   HGB 12.4 09/06/2020 1330   HGB 14.0 11/12/2011 1136   HCT 38.4 09/06/2020 1330   HCT 40.7 11/12/2011 1136   PLT 199 09/06/2020 1330   PLT 274 11/12/2011 1136   MCV 86.9 09/06/2020 1330   MCV 86 11/12/2011 1136   MCH 28.1 09/06/2020 1330   MCHC 32.3 09/06/2020 1330   RDW 13.8 09/06/2020 1330   RDW 12.8 11/12/2011 1136   LYMPHSABS 1.4 09/06/2020 1330   LYMPHSABS 2.5 11/12/2011 1136   MONOABS 0.5 09/06/2020 1330   MONOABS 0.8 11/12/2011 1136   EOSABS 0.2 09/06/2020 1330   EOSABS 0.6 11/12/2011 1136   BASOSABS 0.1 09/06/2020 1330   BASOSABS 0.1 11/12/2011 1136    Hgb A1C Lab Results  Component Value Date   HGBA1C 6.0 (H) 08/26/2020           Assessment & Plan:   [name], NP This visit occurred during the SARS-CoV-2 public health emergency.  Safety protocols were in place, including screening questions prior to the visit, additional usage of staff PPE, and extensive cleaning of exam room while observing appropriate contact time as indicated for disinfecting solutions.

## 2020-12-21 NOTE — Assessment & Plan Note (Signed)
A1c today Urine microalbumin today Encouraged her to consume a low-carb diet and exercise weight loss Could could consider Victoza pending A1c Continue Lyrica Encourage routine eye exams, will request copy Encourage routine foot exams Flu shot UTD Encouraged her to get her COVID booster We will repeat Pneumovax at annual exam

## 2020-12-21 NOTE — Assessment & Plan Note (Signed)
C-Met today 

## 2020-12-21 NOTE — Assessment & Plan Note (Signed)
Stable on paroxetine and Wellbutrin Support offered

## 2020-12-21 NOTE — Assessment & Plan Note (Signed)
Encourage continued use of CPAP Encouraged weight loss as this can help reduce sleep apnea symptoms

## 2020-12-23 LAB — LIPID PANEL
Cholesterol: 135 mg/dL (ref <200)
HDL: 41 mg/dL — ABNORMAL LOW (ref >=50)
LDL Cholesterol (Calc): 65 mg/dL (calc)
Non-HDL Cholesterol (Calc): 94 mg/dL (calc) (ref <130)
Total CHOL/HDL Ratio: 3.3 (calc) (ref <5.0)
Triglycerides: 219 mg/dL — ABNORMAL HIGH (ref <150)

## 2020-12-23 LAB — COMPLETE METABOLIC PANEL WITH GFR
AG Ratio: 1.6 (calc) (ref 1.0–2.5)
ALT: 18 U/L (ref 6–29)
AST: 22 U/L (ref 10–35)
Albumin: 4.2 g/dL (ref 3.6–5.1)
Alkaline phosphatase (APISO): 113 U/L (ref 37–153)
BUN/Creatinine Ratio: 28 (calc) — ABNORMAL HIGH (ref 6–22)
BUN: 30 mg/dL — ABNORMAL HIGH (ref 7–25)
CO2: 31 mmol/L (ref 20–32)
Calcium: 9.2 mg/dL (ref 8.6–10.4)
Chloride: 101 mmol/L (ref 98–110)
Creat: 1.06 mg/dL — ABNORMAL HIGH (ref 0.50–0.99)
GFR, Est African American: 65 mL/min/{1.73_m2} (ref >=60)
GFR, Est Non African American: 56 mL/min/{1.73_m2} — ABNORMAL LOW (ref >=60)
Globulin: 2.7 g/dL (calc) (ref 1.9–3.7)
Glucose, Bld: 158 mg/dL — ABNORMAL HIGH (ref 65–99)
Potassium: 4.9 mmol/L (ref 3.5–5.3)
Sodium: 139 mmol/L (ref 135–146)
Total Bilirubin: 0.4 mg/dL (ref 0.2–1.2)
Total Protein: 6.9 g/dL (ref 6.1–8.1)

## 2020-12-23 LAB — HEMOGLOBIN A1C
Hgb A1c MFr Bld: 6.4 % of total Hgb — ABNORMAL HIGH (ref <5.7)
Mean Plasma Glucose: 137 mg/dL
eAG (mmol/L): 7.6 mmol/L

## 2020-12-23 LAB — MICROALBUMIN / CREATININE URINE RATIO
Creatinine, Urine: 137 mg/dL (ref 20–275)
Microalb Creat Ratio: 1 mcg/mg creat (ref <30)
Microalb, Ur: 0.2 mg/dL

## 2020-12-26 ENCOUNTER — Encounter: Payer: Self-pay | Admitting: Internal Medicine

## 2020-12-27 MED ORDER — EZETIMIBE 10 MG PO TABS: 10.0000 mg | ORAL_TABLET | Freq: Every day | ORAL | 1 refills | Status: DC

## 2020-12-28 MED ORDER — OZEMPIC (0.25 OR 0.5 MG/DOSE) 2 MG/1.5ML ~~LOC~~ SOPN: 0.2500 mg | PEN_INJECTOR | SUBCUTANEOUS | 3 refills | Status: DC

## 2020-12-28 MED ORDER — INSULIN PEN NEEDLE 31G X 5 MM MISC: 2 refills | Status: DC

## 2020-12-28 NOTE — Addendum Note (Signed)
Addended by: Jearld Fenton on: 12/28/2020 10:52 AM   Modules accepted: Orders

## 2021-01-04 DIAGNOSIS — R202 Paresthesia of skin: Secondary | ICD-10-CM | POA: Diagnosis not present

## 2021-01-04 DIAGNOSIS — M5416 Radiculopathy, lumbar region: Secondary | ICD-10-CM | POA: Diagnosis not present

## 2021-01-04 DIAGNOSIS — M419 Scoliosis, unspecified: Secondary | ICD-10-CM | POA: Diagnosis not present

## 2021-01-04 DIAGNOSIS — R200 Anesthesia of skin: Secondary | ICD-10-CM | POA: Diagnosis not present

## 2021-01-04 DIAGNOSIS — M545 Low back pain, unspecified: Secondary | ICD-10-CM | POA: Diagnosis not present

## 2021-01-09 IMAGING — CT CT HEAD W/O CM
3 series · 15 of 47 positions shown, 18 images · non-contrast
Comparison: CT head 09/12/2015

CLINICAL DATA: Dizziness and multiple episodes of near syncope

EXAM:
CT HEAD WITHOUT CONTRAST
TECHNIQUE: Contiguous axial images were obtained from the base of the skull
through the vertex without intravenous contrast.

[Series 3: head wo · axial · 0.41mm/px · z∈[-115,+10]mm · 9 of 30 slices shown, 12 images]
[im 3/30  brain]
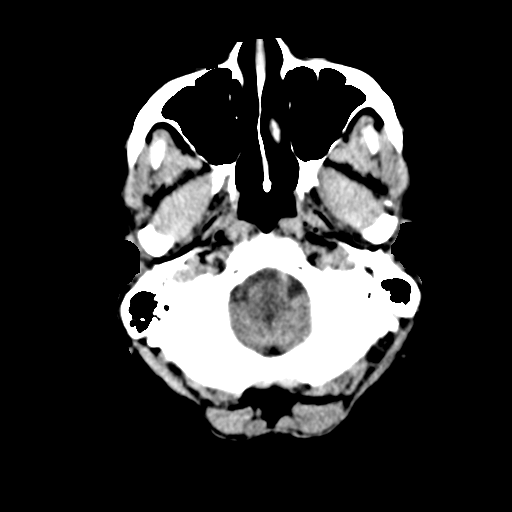
[im 3/30  bone]
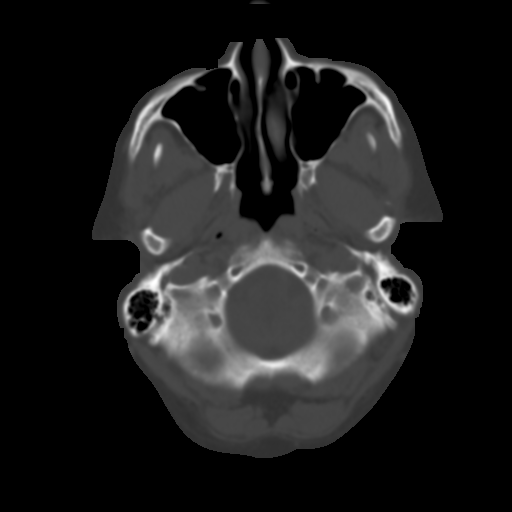
[im 6/30  brain]
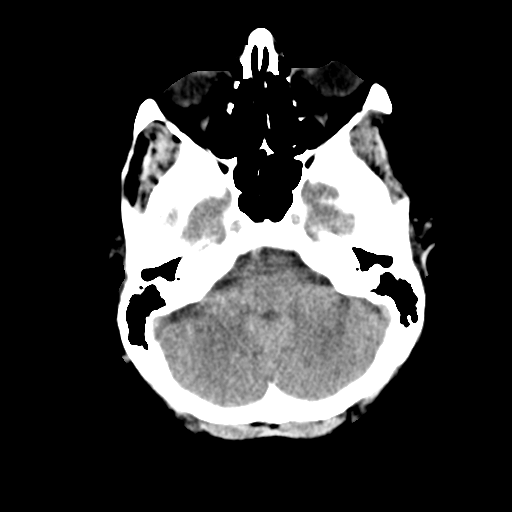
[im 9/30  brain]
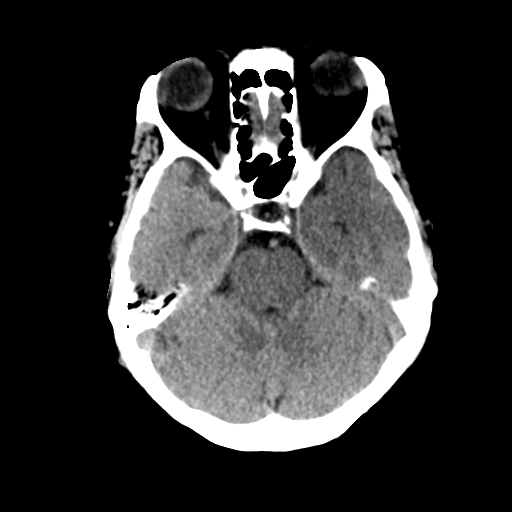
[im 12/30  brain]
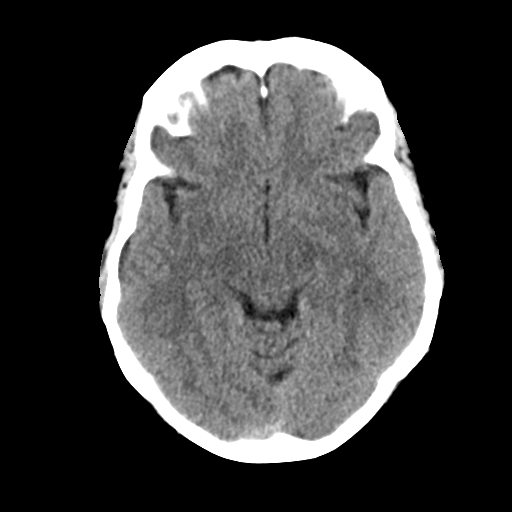
[im 16/30  brain]
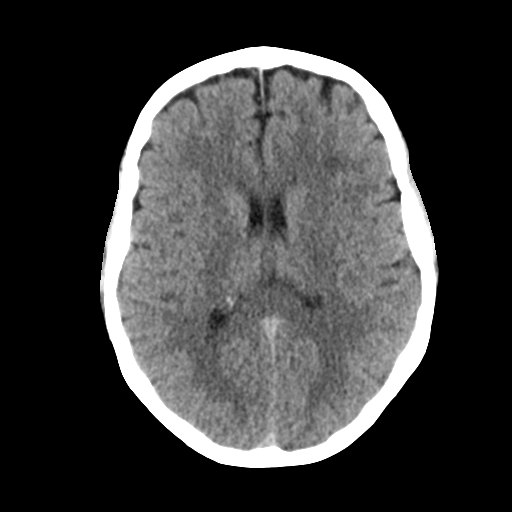
[im 16/30  bone]
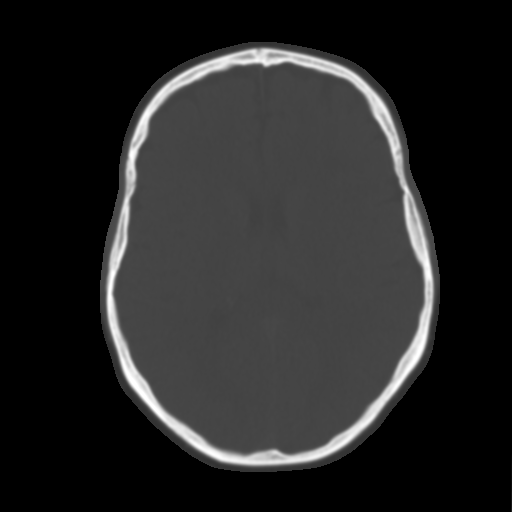
[im 19/30  brain]
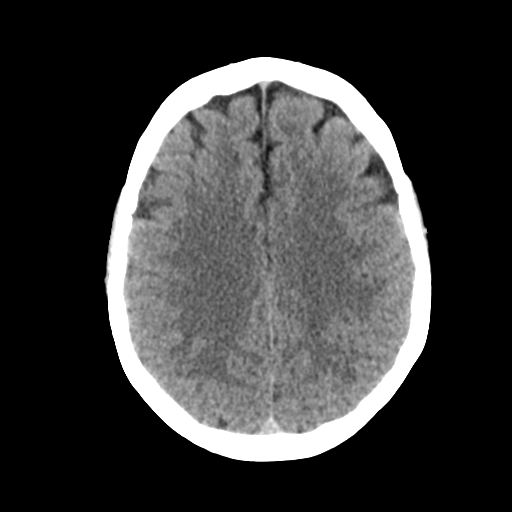
[im 22/30  brain]
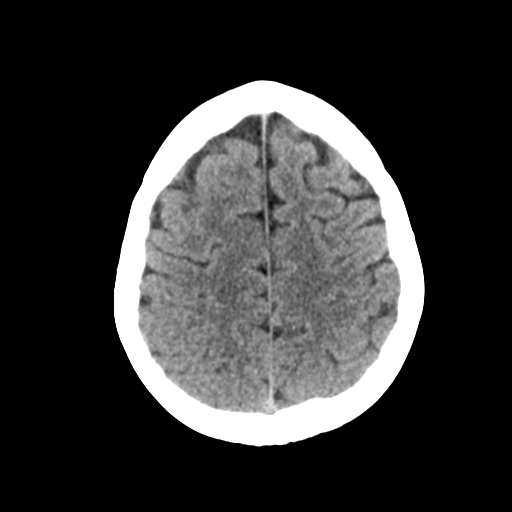
[im 25/30  brain]
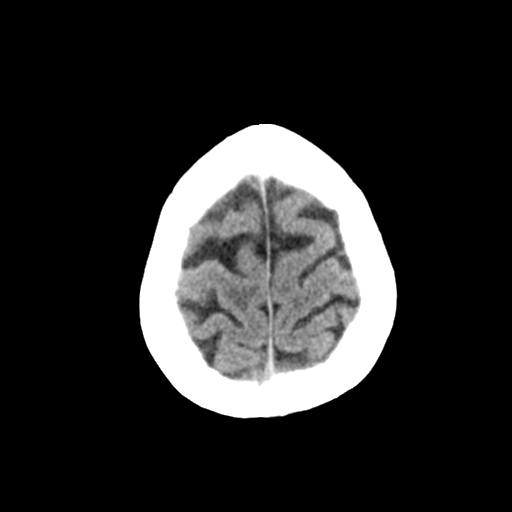
[im 28/30  brain]
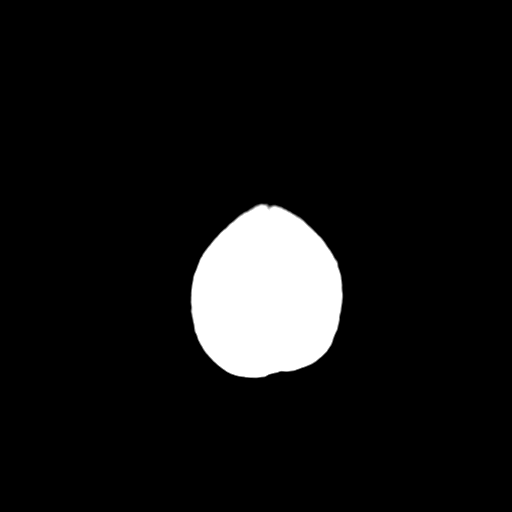
[im 28/30  bone]
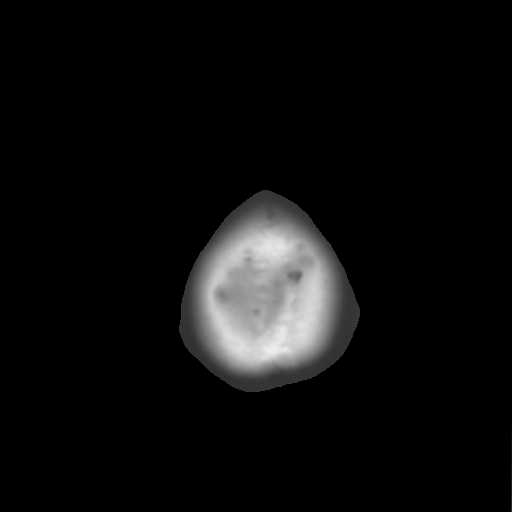

[Series 4: coronal soft tissue · coronal · 0.28mm/px · 3 of 62 slices shown]
[im 21/62  brain]
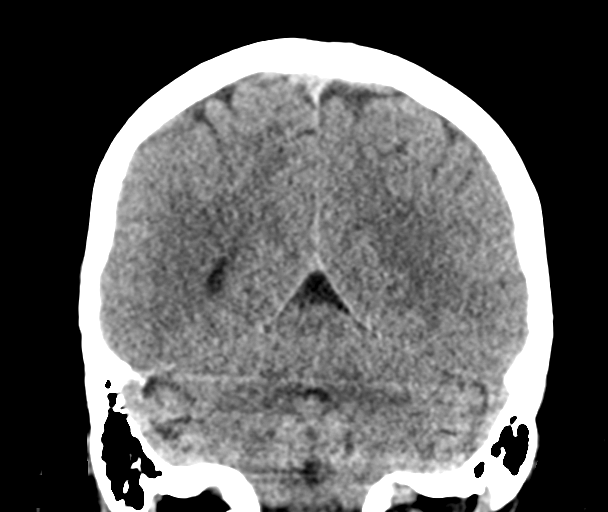
[im 28/62  brain]
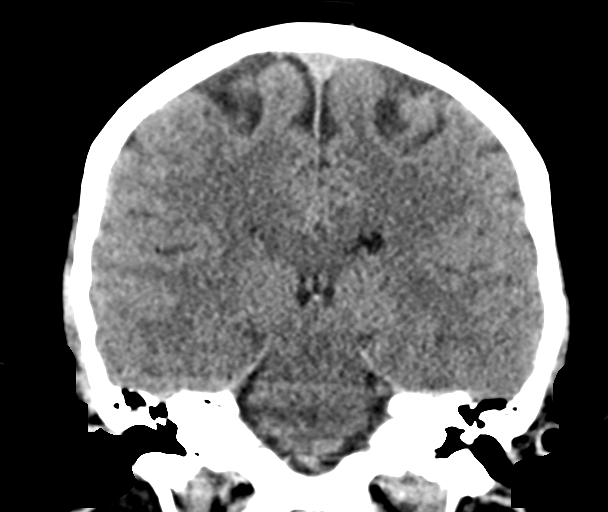
[im 34/62  brain]
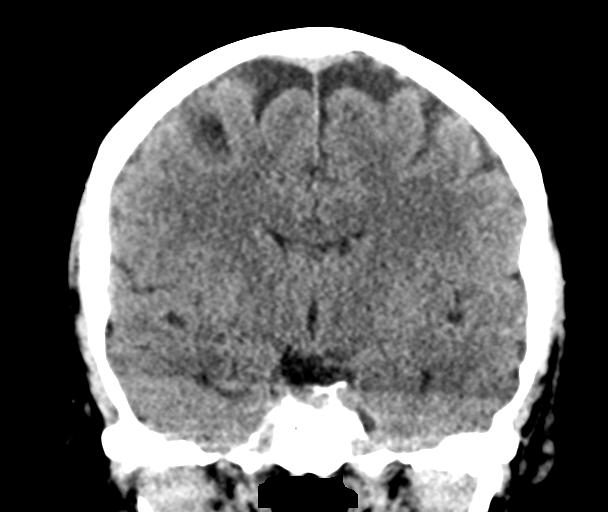

[Series 5: sagittal soft tissue · sagittal · 0.31mm/px · 3 of 51 slices shown]
[im 17/51  brain]
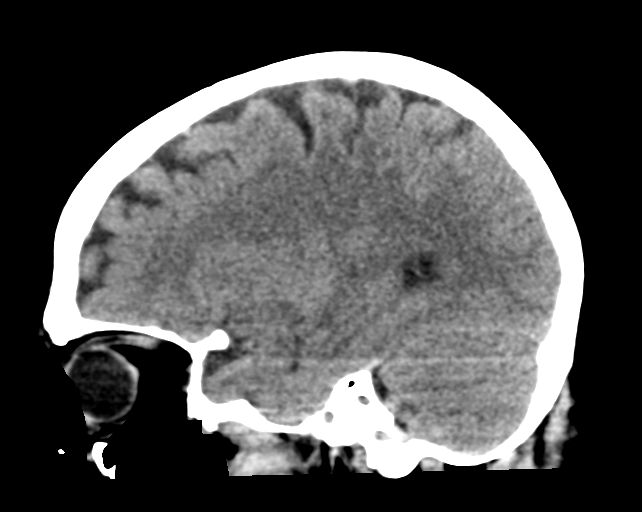
[im 26/51  brain]
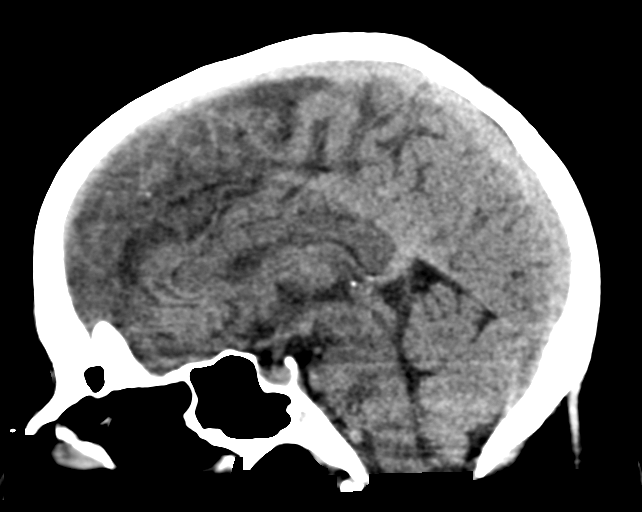
[im 34/51  brain]
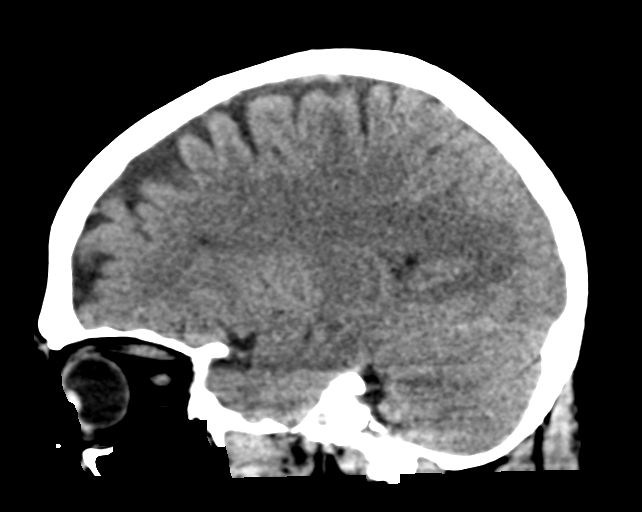

[15 of 47 positions shown; findings below may reference images not displayed]

FINDINGS: Brain: Stable focus of hypoattenuation in the left frontal lobe
([DATE]) corresponding with a perivascular spaces characterized on
prior MR imaging. No evidence of acute infarction, hemorrhage,
hydrocephalus, extra-axial collection or mass lesion/mass effect.
Symmetric prominence of the ventricles, cisterns and sulci
compatible with parenchymal volume loss. Patchy areas of white
matter hypoattenuation are most compatible with chronic
microvascular angiopathy.

Vascular: Atherosclerotic calcification of the carotid siphons. No
hyperdense vessel.

Skull: No calvarial fracture or suspicious osseous lesion. No scalp
swelling or hematoma.

Sinuses/Orbits: Chronic left maxillary retention cyst. Remaining
paranasal sinuses and mastoids are predominantly clear. Included
orbital structures are unremarkable.

Other: None
IMPRESSION: 1. No acute intracranial abnormality.
2. Stable parenchymal volume loss and chronic microvascular
angiopathy.

## 2021-01-11 DIAGNOSIS — M5416 Radiculopathy, lumbar region: Secondary | ICD-10-CM | POA: Diagnosis not present

## 2021-01-11 DIAGNOSIS — M419 Scoliosis, unspecified: Secondary | ICD-10-CM | POA: Diagnosis not present

## 2021-01-11 DIAGNOSIS — M545 Low back pain, unspecified: Secondary | ICD-10-CM | POA: Diagnosis not present

## 2021-01-14 DIAGNOSIS — G4733 Obstructive sleep apnea (adult) (pediatric): Secondary | ICD-10-CM | POA: Diagnosis not present

## 2021-01-17 DIAGNOSIS — M419 Scoliosis, unspecified: Secondary | ICD-10-CM | POA: Diagnosis not present

## 2021-01-17 DIAGNOSIS — M545 Low back pain, unspecified: Secondary | ICD-10-CM | POA: Diagnosis not present

## 2021-01-17 DIAGNOSIS — M5416 Radiculopathy, lumbar region: Secondary | ICD-10-CM | POA: Diagnosis not present

## 2021-01-19 DIAGNOSIS — M545 Low back pain, unspecified: Secondary | ICD-10-CM | POA: Diagnosis not present

## 2021-01-19 DIAGNOSIS — M5416 Radiculopathy, lumbar region: Secondary | ICD-10-CM | POA: Diagnosis not present

## 2021-01-19 DIAGNOSIS — M419 Scoliosis, unspecified: Secondary | ICD-10-CM | POA: Diagnosis not present

## 2021-01-24 ENCOUNTER — Other Ambulatory Visit: Payer: Self-pay

## 2021-01-24 MED ORDER — PAIN MANAGEMENT IT PUMP REFILL: 1.0000 | Freq: Once | INTRATHECAL | 0 refills | Status: AC

## 2021-01-26 ENCOUNTER — Other Ambulatory Visit: Payer: Self-pay | Admitting: Internal Medicine

## 2021-01-26 DIAGNOSIS — M545 Low back pain, unspecified: Secondary | ICD-10-CM | POA: Diagnosis not present

## 2021-01-26 DIAGNOSIS — M419 Scoliosis, unspecified: Secondary | ICD-10-CM | POA: Diagnosis not present

## 2021-01-26 DIAGNOSIS — M7918 Myalgia, other site: Secondary | ICD-10-CM

## 2021-01-26 DIAGNOSIS — M5416 Radiculopathy, lumbar region: Secondary | ICD-10-CM | POA: Diagnosis not present

## 2021-01-26 DIAGNOSIS — M5441 Lumbago with sciatica, right side: Secondary | ICD-10-CM

## 2021-01-26 DIAGNOSIS — G8929 Other chronic pain: Secondary | ICD-10-CM

## 2021-01-26 NOTE — Telephone Encounter (Signed)
Requested medication (s) are due for refill today: yes   Requested medication (s) are on the active medication list: yes   Last refill:  11/01/2020  Future visit scheduled: yes  Notes to clinic: this refill cannot be delegated    Requested Prescriptions  Pending Prescriptions Disp Refills   tiZANidine (ZANAFLEX) 4 MG tablet [Pharmacy Med Name: TIZANIDINE HCL 4 MG TABLET] 270 tablet     Sig: Take 1 tablet (4 mg total) by mouth every 8 (eight) hours as needed for muscle spasms.      Not Delegated - Cardiovascular:  Alpha-2 [facility]onists - tizanidine Failed - 01/26/2021  8:53 AM      Failed - This refill cannot be delegated      Passed - Valid encounter within last 6 months    Recent Outpatient Visits           1 month ago Type 2 diabetes mellitus with diabetic mononeuropathy, without long-term current use of insulin ([place])   [facility] [place], Coralie Keens, NP       Future Appointments             In 4 months Baity, Coralie Keens, NP [facility], [facility]

## 2021-02-02 DIAGNOSIS — M545 Low back pain, unspecified: Secondary | ICD-10-CM | POA: Diagnosis not present

## 2021-02-02 DIAGNOSIS — M5416 Radiculopathy, lumbar region: Secondary | ICD-10-CM | POA: Diagnosis not present

## 2021-02-02 DIAGNOSIS — M419 Scoliosis, unspecified: Secondary | ICD-10-CM | POA: Diagnosis not present

## 2021-02-06 ENCOUNTER — Other Ambulatory Visit: Payer: Self-pay | Admitting: Internal Medicine

## 2021-02-06 DIAGNOSIS — M797 Fibromyalgia: Secondary | ICD-10-CM

## 2021-02-06 MED ORDER — PREGABALIN 150 MG PO CAPS: 150.0000 mg | ORAL_CAPSULE | Freq: Three times a day (TID) | ORAL | 2 refills | Status: DC

## 2021-02-10 ENCOUNTER — Other Ambulatory Visit: Payer: Self-pay | Admitting: Internal Medicine

## 2021-02-14 ENCOUNTER — Other Ambulatory Visit: Payer: Self-pay | Admitting: Family

## 2021-02-14 DIAGNOSIS — G4733 Obstructive sleep apnea (adult) (pediatric): Secondary | ICD-10-CM | POA: Diagnosis not present

## 2021-02-16 ENCOUNTER — Encounter: Payer: Self-pay | Admitting: Internal Medicine

## 2021-02-16 ENCOUNTER — Encounter: Payer: Self-pay | Admitting: Pain Medicine

## 2021-02-17 MED ORDER — BUPROPION HCL ER (XL) 150 MG PO TB24: 150.0000 mg | ORAL_TABLET | Freq: Every day | ORAL | 1 refills | Status: DC

## 2021-02-20 NOTE — Progress Notes (Signed)
PROVIDER NOTE: Information contained herein reflects review and annotations entered in association with encounter. Interpretation of such information and data should be left to medically-trained personnel. Information provided to patient can be located elsewhere in the medical record under "Patient Instructions". Document created using STT-dictation technology, any transcriptional errors that may result from process are unintentional.    Patient: Melinda [name]  Service Category: Procedure  Provider: Gaspar Cola, MD  DOB: 03/10/1956  DOS: 02/21/2021  Location: [facility]  MRN: [id number]  Setting: Ambulatory - outpatient  Referring Provider: Jearld Fenton, NP  Type: Established Patient  Specialty: Interventional Pain Management  PCP: Jearld Fenton, NP   Primary Reason for Visit: Interventional Pain Management Treatment. CC: Back Pain (Left buttock)  Procedure:          Intrathecal Drug Delivery System (IDDS):  Type: Reservoir Refill [id number]) No rate change Region: Abdominal Laterality: Right  157 mls drained.  Type of Pump: Medtronic Synchromed II Delivery Route: Intrathecal Type of Pain Treated: Neuropathic/Nociceptive Primary Medication Class: Opioid/opiate  Medication, Concentration, Infusion Program, & Delivery Rate: Please see scanned programming printout.   Indications: 1. Chronic hip pain (Bilateral)   2. Osteoarthritis of hips (Bilateral)   3. Chronic pain syndrome   4. Failed back surgical syndrome   5. Chronic low back pain (Bilateral) (L>R)   6. Chronic low back pain (Bilateral) w/o sciatica   7. DDD (degenerative disc disease), lumbosacral   8. Epidural fibrosis   9. Presence of intrathecal pump (Medtronic intrathecal programmable pump) (40 mL pump)   10. Malfunction of intrathecal infusion pump, sequela   11. Cervicalgia   12. Cervical spondylosis   13. Encounter for chronic pain management   14. Encounter for medication management   15.  Encounter for therapeutic procedure   16. Encounter for adjustment or management of infusion pump   17. Encounter for interrogation of infusion pump   18. Pharmacologic therapy   19. Chronic use of opiate for therapeutic purpose    Pain Assessment: Self-Reported Pain Score: 3 /10             Reported level is compatible with observation.        Today the patient indicates having increased pain in the anterior and posterior thigh and buttocks area.  No low back pain.  Patrick maneuver was positive bilaterally for hip joint arthralgia and on the right side for sacroiliac joint arthralgia.  I will be scheduling the patient to return for bilateral hip joint injection with local anesthetic and steroid to confirm origin of pain.  The patient indicated having talked to the neurosurgeon who informed her that revision of the pump with placement in a different location was no guarantee that the fluid would not reaccumulate or that anchors with tape.  Looking to have those thoughts, the patient indicates having decided to continue with the pump as is hoping that he will continue to work well without running into any major problems.  Since she has reaccumulated some fluid in the pocket she has requested that we drained that today.  A total of 157 mL of serous fluid was removed today without any complications.  Fluoroscopy was used to access the pump since it did flip and the access port was facing the abdominal wall muscles.  The pump had to be flipped before we could access it and refill it.  Pharmacotherapy Assessment   Analgesic: Oxycodone 5 mg, 1 tab PO q 8 hrs (15 mg/day of oxycodone) +  Intrathecal PF-Fentanyl MME/day: 22.5 mg/day (oral).   Monitoring: [place] PMP: PDMP reviewed during this encounter.       Pharmacotherapy: No side-effects or adverse reactions reported. Compliance: No problems identified. Effectiveness: Clinically acceptable. Plan: Refer to "POC". UDS:  Summary  Date Value Ref Range  Status  07/24/2018 FINAL  Final    Comment:    ==================================================================== TOXASSURE SELECT 13 (MW) ==================================================================== Test                             Result       Flag       Units Drug Present and Declared for Prescription Verification   Oxycodone                      754          EXPECTED   ng/mg creat   Oxymorphone                    231          EXPECTED   ng/mg creat   Noroxycodone                   2463         EXPECTED   ng/mg creat   Noroxymorphone                 89           EXPECTED   ng/mg creat    Sources of oxycodone are scheduled prescription medications.    Oxymorphone, noroxycodone, and noroxymorphone are expected    metabolites of oxycodone. Oxymorphone is also available as a    scheduled prescription medication. Drug Present not Declared for Prescription Verification   Fentanyl                       4            UNEXPECTED ng/mg creat   Norfentanyl                    29           UNEXPECTED ng/mg creat    Source of fentanyl is a scheduled prescription medication,    including IV, patch, and transmucosal formulations. Norfentanyl    is an expected metabolite of fentanyl. ==================================================================== Test                      Result    Flag   Units      Ref Range   Creatinine              134              mg/dL      >=07 ==================================================================== Declared Medications:  The flagging and interpretation on this report are based on the  following declared medications.  Unexpected results may arise from  inaccuracies in the declared medications.  **Note: The testing scope of this panel includes these medications:  Oxycodone  **Note: The testing scope of this panel does not include following  reported medications:  Aspirin (Aspirin 81)  Bupropion (Wellbutrin)  Chondroitin  (Glucosamine-Chondroitin)  Cinnamon  Glipizide (Glucotrol)  Glucosamine (Glucosamine-Chondroitin)  Magnesium  Multivitamin  Naloxone  Nitroglycerin (Nitrostat)  Paroxetine  Polyethylene Glycol (GlycoLAX)  Polyethylene Glycol (MiraLAX)  Pregabalin (Lyrica)  Rosuvastatin (Crestor)  Supplement (Omega-3)  Tizanidine (Zanaflex)  Turmeric  Ubiquinone (Coenzyme Q 10)  Vitamin D2 (Drisdol)  Vitamin E ==================================================================== For clinical consultation, please call [phone]. ====================================================================      Intrathecal Pump Therapy Assessment  Manufacturer: Medtronic Synchromed Type: Programmable Volume: 40 mL reservoir MRI compatibility: Yes   Drug content:  Primary Medication Class: Opioid Primary Medication: PF-Fentanyl (1000 mcg/mL)  Secondary Medication: PF-Bupivacaine (20 mg/mL)  Other Medication: PF-Baclofen (240 mcg/mL)    Programming:  Type: Simple continuous. See pump readout for details.   Changes:  Medication Change: None at this point Rate Change: No change in rate  Reported side-effects or adverse reactions: None reported  Effectiveness: Described as relatively effective, allowing for increase in activities of daily living (ADL) Clinically meaningful improvement in function (CMIF): Sustained CMIF goals met  Plan: Pump refill today  Pre-op H&P Assessment:  Ms. Riso is a 65 y.o. (year old), female patient, seen today for interventional treatment. 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 Ms. Cannell has a current medication list which includes the following prescription(s): accu-chek aviva plus, accu-chek guide, accu-chek softclix lancets, AMBULATORY NON FORMULARY MEDICATION, vitamin c, aspirin, b complex vitamins, biotin, accu-chek guide control, bupropion, calcium-magnesium-zinc, vitamin d, cinnamon, ezetimibe, glucosamine-chondroitin, insulin pen needle, magnesium, nitroglycerin, NONFORMULARY OR COMPOUNDED ITEM, fish oil, ozempic (0.25 or 0.5 mg/dose), paroxetine, polyethylene glycol, pregabalin, rosuvastatin, tizanidine, vitamin e, OVER THE COUNTER MEDICATION, [START ON 02/28/2021] oxycodone-acetaminophen, [START ON 03/30/2021] oxycodone-acetaminophen, and [START ON 04/29/2021] oxycodone-acetaminophen. Her primarily concern today is the Back Pain (Left buttock)  Initial Vital Signs:  Pulse/HCG Rate: 79  Temp: (!) 97.1 F (36.2 C) Resp: 16 BP: (!) 146/65 SpO2: 98 %  BMI: Estimated body mass index is 33.67 kg/m as calculated from the following:   Height as of this encounter: $RemoveBeforeD'5\' 9"'[id number]$  (1.753 m).   Weight as of this encounter: 228 lb (103.4 kg).  Risk Assessment: Allergies: Reviewed. She has No Known Allergies.  Allergy Precautions: None required Coagulopathies: Reviewed. None identified.  Blood-thinner therapy: None at this time Active Infection(s): Reviewed. None identified. Ms. Crisafulli is afebrile  Site Confirmation: Ms. Vowles was asked to confirm the procedure and laterality before marking the  site Procedure checklist: Completed Consent: Before the procedure and under the influence of no sedative(s), amnesic(s), or anxiolytics, the patient was informed of the treatment options, risks and possible complications. To fulfill our ethical and legal obligations, as recommended by the American Medical Association's Code of Ethics, I have informed the patient of my clinical impression; the nature and purpose of the treatment or procedure; the risks, benefits, and possible complications of the intervention; the alternatives, including doing nothing; the risk(s) and benefit(s) of the alternative treatment(s) or procedure(s); and the risk(s) and benefit(s) of doing nothing.  Ms. Lopezmartinez was provided with information about the general risks and possible complications associated with most interventional procedures. These include, but are not limited to: failure to achieve desired  goals, infection, bleeding, organ or nerve damage, allergic reactions, paralysis, and/or death.  In addition, she was informed of those risks and possible complications associated to this particular procedure, which include, but are not limited to: damage to the implant; failure to decrease pain; local, systemic, or serious CNS infections, intraspinal abscess with possible cord compression and paralysis, or life-threatening such as meningitis; bleeding; organ damage; nerve injury or damage with subsequent sensory, motor, and/or autonomic system dysfunction, resulting in transient or permanent pain, numbness, and/or weakness of one or several areas of the body; allergic reactions, either minor or major life-threatening, such as anaphylactic or anaphylactoid reactions.  Furthermore, Ms. Blankenbeckler was informed of those risks and complications associated with the medications. These include, but are not limited to: allergic reactions (i.e.: anaphylactic or anaphylactoid reactions); endorphine suppression; bradycardia and/or hypotension;  water retention and/or peripheral vascular relaxation leading to lower extremity edema and possible stasis ulcers; respiratory depression and/or shortness of breath; decreased metabolic rate leading to weight gain; swelling or edema; medication-induced neural toxicity; particulate matter embolism and blood vessel occlusion with resultant organ, and/or nervous system infarction; and/or intrathecal granuloma formation with possible spinal cord compression and permanent paralysis.  Before refilling the pump Ms. Davoli was informed that some of the medications used in the devise may not be FDA approved for such use and therefore it constitutes an off-label use of the medications.  Finally, she was informed that Medicine is not an exact science; therefore, there is also the possibility of unforeseen or unpredictable risks and/or possible complications that may result in a catastrophic outcome. The patient indicated having understood very clearly. We have given the patient no guarantees and we have made no promises. Enough time was given to the patient to ask questions, all of which were answered to the patient's satisfaction. Ms. Fulgham has indicated that she wanted to continue with the procedure. Attestation: I, the ordering provider, attest that I have discussed with the patient the benefits, risks, side-effects, alternatives, likelihood of achieving goals, and potential problems during recovery for the procedure that I have provided informed consent. Date  Time: 02/21/2021 12:59 PM  Pre-Procedure Preparation:  Monitoring: As per clinic protocol. Respiration, ETCO2, SpO2, BP, heart rate and rhythm monitor placed and checked for adequate function Safety Precautions: Patient was assessed for positional comfort and pressure points before starting the procedure. Time-out: I initiated and conducted the "Time-out" before starting the procedure, as per protocol. The patient was asked to participate by confirming  the accuracy of the "Time Out" information. Verification of the correct person, site, and procedure were performed and confirmed by me, the nursing staff, and the patient. "Time-out" conducted as per Joint Commission's Universal Protocol (UP.01.01.01). Time: 1303  Description of Procedure:          Position: Supine Target Area: Central-port of intrathecal pump. Approach: Anterior, 90 degree angle approach. Area Prepped: Entire Area around the pump implant. DuraPrep (Iodine Povacrylex [0.7% available iodine] and Isopropyl Alcohol, 74% w/w) Safety Precautions: Aspiration looking for blood return was conducted prior to all injections. At no point did we inject any substances, as a needle was being advanced. No attempts were made at seeking any paresthesias. Safe injection practices and needle disposal techniques used. Medications properly checked for expiration dates. SDV (single dose vial) medications used. Description of the Procedure: Protocol guidelines were followed. Two nurses trained to do implant refills were present during the entire procedure. The refill medication was checked by both healthcare providers as well as the  patient. The patient was included in the "Time-out" to verify the medication. The patient was placed in position. The pump was identified. The area was prepped in the usual manner. The sterile template was positioned over the pump, making sure the side-port location matched that of the pump. Both, the pump and the template were held for stability. The needle provided in the Medtronic Kit was then introduced thru the center of the template and into the central port. The pump content was aspirated and discarded volume documented. The new medication was slowly infused into the pump, thru the filter, making sure to avoid overpressure of the device. The needle was then removed and the area cleansed, making sure to leave some of the prepping solution back to take advantage of its long term  bactericidal properties. The pump was interrogated and programmed to reflect the correct medication, volume, and dosage. The program was printed and taken to the physician for approval. Once checked and signed by the physician, a copy was provided to the patient and another scanned into the EMR. Vitals:   02/21/21 1257  BP: (!) 146/65  Pulse: 79  Resp: 16  Temp: (!) 97.1 F (36.2 C)  TempSrc: Temporal  SpO2: 98%  Weight: 228 lb (103.4 kg)  Height: 5\' 9"  (1.753 m)    Start Time: 1310 hrs. End Time: 1344 hrs. Materials & Medications: Medtronic Refill Kit Medication(s): Please see chart orders for details.  Imaging Guidance:          Type of Imaging Technique: Fluoroscopy Guidance (Non-spinal) Indication(s):  Intrathecal pump migration with difficulty accessing central port. Exposure Time: Please see nurses notes. Contrast: None used. Fluoroscopic Guidance: I was personally present during the use of fluoroscopy. "Tunnel Vision Technique" used to obtain the best possible view of the target area. Parallax error corrected before commencing the procedure. "Direction-depth-direction" technique used to introduce the needle under continuous pulsed fluoroscopy. Once target was reached, antero-posterior, oblique, and lateral fluoroscopic projection used confirm needle placement in all planes. Images permanently stored in EMR. Ultrasound Guidance: N/A Interpretation: No contrast injected. I personally interpreted the imaging intraoperatively. Adequate needle placement confirmed in multiple planes. Permanent images saved into the patient's record.  Central port accessed after having manipulated pump to get correct position.  Antibiotic Prophylaxis:   Anti-infectives (From admission, onward)    None      Indication(s): None identified  Post-operative Assessment:  Post-procedure Vital Signs:  Pulse/HCG Rate: 79  Temp: (!) 97.1 F (36.2 C) Resp: 16 BP: (!) 146/65 SpO2: 98 %  EBL:  None  Complications: No immediate post-treatment complications observed by team, or reported by patient.  Note: The patient tolerated the entire procedure well. A repeat set of vitals were taken after the procedure and the patient was kept under observation following institutional policy, for this type of procedure. Post-procedural neurological assessment was performed, showing return to baseline, prior to discharge. The patient was provided with post-procedure discharge instructions, including a section on how to identify potential problems. Should any problems arise concerning this procedure, the patient was given instructions to immediately contact us, at any time, without hesitation. In any case, we plan to contact the patient by telephone for a follow-up status report regarding this interventional procedure.  Comments:  No additional relevant information.  Plan of Care  Orders:  Orders Placed This Encounter  Procedures   PUMP REFILL    Maintain Protocol by having two(2) healthcare providers during procedure and programming.    Scheduling Instructions:  Please refill intrathecal pump today.    Order Specific Question:   Where will this procedure be performed?    Answer:   [facility]   PUMP REFILL    Whenever possible schedule on a procedure today.    Standing Status:   Future    Standing Expiration Date:   07/20/2021    Scheduling Instructions:     Please schedule intrathecal pump refill based on pump programming. Avoid schedule intervals of more than 120 days (4 months).    Order Specific Question:   Where will this procedure be performed?    Answer:   [facility]   HIP INJECTION    Standing Status:   Future    Standing Expiration Date:   05/24/2021    Scheduling Instructions:     Side: Bilateral     Sedation: No Sedation.     Timeframe: As soon as schedule allows   DG PAIN [facility] C-ARM 1-60 MIN NO REPORT    Intraoperative interpretation by procedural  physician at [facility].    Standing Status:   Standing    Number of Occurrences:   1    Order Specific Question:   Reason for exam:    Answer:   Assistance in needle guidance and placement for procedures requiring needle placement in or near specific anatomical locations not easily accessible without such assistance.   Informed Consent Details: Physician/Practitioner Attestation; Transcribe to consent form and obtain patient signature    Transcribe to consent form and obtain patient signature.    Order Specific Question:   Physician/Practitioner attestation of informed consent for procedure/surgical case    Answer:   I, the physician/practitioner, attest that I have discussed with the patient the benefits, risks, side effects, alternatives, likelihood of achieving goals and potential problems during recovery for the procedure that I have provided informed consent.    Order Specific Question:   Procedure    Answer:   Intrathecal pump refill    Order Specific Question:   Physician/Practitioner performing the procedure    Answer:   Attending Physician: Kathlen Brunswick. Dossie Arbour, MD & designated trained staff    Order Specific Question:   Indication/Reason    Answer:   Chronic Pain Syndrome (G89.4), presence of an intrathecal pump (Z97.8)   Informed Consent Details: Physician/Practitioner Attestation; Transcribe to consent form and obtain patient signature    Nursing Order: Transcribe to consent form and obtain patient signature. Note: Always confirm laterality of pain with Ms. Chana Bode, before procedure.    Order Specific Question:   Physician/Practitioner attestation of informed consent for procedure/surgical case    Answer:   I, the physician/practitioner, attest that I have discussed with the patient the benefits, risks, side effects, alternatives, likelihood of achieving goals and potential problems during recovery for the procedure that I have provided informed consent.    Order Specific  Question:   Procedure    Answer:   Hip injection    Order Specific Question:   Physician/Practitioner performing the procedure    Answer:   Tamicka Shimon A. Dossie Arbour, MD    Order Specific Question:   Indication/Reason    Answer:   Hip Joint Pain (Arthralgia)    Chronic Opioid Analgesic:  Oxycodone 5 mg, 1 tab PO q 8 hrs (15 mg/day of oxycodone) + Intrathecal PF-Fentanyl MME/day: 22.5 mg/day (oral).   Medications ordered for procedure: Meds ordered this encounter  Medications   oxyCODONE-acetaminophen (PERCOCET) 5-325 MG tablet    Sig: Take 1  tablet by mouth every 8 (eight) hours as needed for severe pain.    Dispense:  90 tablet    Refill:  0    Not a duplicate. Do NOT delete! Dispense 1 day early if closed on refill date. Avoid benzodiazepines within 8 hours of opioids. Do not send refill requests.   oxyCODONE-acetaminophen (PERCOCET) 5-325 MG tablet    Sig: Take 1 tablet by mouth every 8 (eight) hours as needed for severe pain.    Dispense:  90 tablet    Refill:  0    Not a duplicate. Do NOT delete! Dispense 1 day early if closed on refill date. Avoid benzodiazepines within 8 hours of opioids. Do not send refill requests.   oxyCODONE-acetaminophen (PERCOCET) 5-325 MG tablet    Sig: Take 1 tablet by mouth every 8 (eight) hours as needed for severe pain.    Dispense:  90 tablet    Refill:  0    Not a duplicate. Do NOT delete! Dispense 1 day early if closed on refill date. Avoid benzodiazepines within 8 hours of opioids. Do not send refill requests.    Medications administered: Melinda [name] had no medications administered during this visit.  See the medical record for exact dosing, route, and time of administration.  Follow-up plan:   Return for Pump Refill (Max:30mo).       Interventional Therapies  Risk  Complexity Considerations:   Estimated body mass index is 33.45 kg/m as calculated from the following:   Height as of this encounter: $RemoveBeforeD'5\' 8"'[id number]$  (1.727 m).   Weight as of this  encounter: 220 lb (99.8 kg). WNL   Planned  Pending:   Intrathecal pump pocket revision and reanchoring of device.   Under consideration:   Possible left lumbar facet RFA  Possible right lumbar facet RFA  Diagnostic caudal ESI + diagnostic epidurogram  Possible Racz procedure  Diagnostic bilateral IA knee injection (Steroid) Diagnostic bilateral genicular NB  Possible bilateral genicular nerve RFA  Possible bilateral Hyalgan knee injection  Diagnostic cervical ESI  Diagnostic bilateral cervical facet block  Possible bilateral cervical facet RFA    Completed:   Diagnostic/therapeutic left L2-3 LESI x1  Diagnostic/therapeutic right lumbar facet block x3  Diagnostic/therapeutic left lumbar facet block x1    Therapeutic  Palliative (PRN) options:   Palliative/therapeutic intrathecal pump management (refills/programming adjustments)  Palliative left L2-3 LESI #2  Palliative right lumbar facet block #4  Diagnostic left lumbar facet block #2      Recent Visits No visits were found meeting these conditions. Showing recent visits within past 90 days and meeting all other requirements Today's Visits Date Type Provider Dept  02/21/21 Procedure visit [name], MD [facility]  Showing today's visits and meeting all other requirements Future Appointments Date Type Provider Dept  04/25/21 Appointment [name], MD [facility]  Showing future appointments within next 90 days and meeting all other requirements Disposition: Discharge home  Discharge (Date  Time): 02/21/2021; 1400 hrs.   Primary Care Physician: Jearld Fenton, NP Location: [facility] Note by: Gaspar Cola, MD Date: 02/21/2021; Time: 5:44 PM  Disclaimer:  Medicine is not an Chief Strategy Officer. The only guarantee in medicine is that nothing is guaranteed. It is important to note that the decision to proceed with this intervention was based on the  information collected from the patient. The Data and conclusions were drawn from the patient's questionnaire, the interview, and the physical examination. Because the information was  provided in large part by the patient, it cannot be guaranteed that it has not been purposely or unconsciously manipulated. Every effort has been made to obtain as much relevant data as possible for this evaluation. It is important to note that the conclusions that lead to this procedure are derived in large part from the available data. Always take into account that the treatment will also be dependent on availability of resources and existing treatment guidelines, considered by other Pain Management Practitioners as being common knowledge and practice, at the time of the intervention. For Medico-Legal purposes, it is also important to point out that variation in procedural techniques and pharmacological choices are the acceptable norm. The indications, contraindications, technique, and results of the above procedure should only be interpreted and judged by a Board-Certified Interventional Pain Specialist with extensive familiarity and expertise in the same exact procedure and technique.

## 2021-02-21 ENCOUNTER — Ambulatory Visit (HOSPITAL_BASED_OUTPATIENT_CLINIC_OR_DEPARTMENT_OTHER): Payer: Medicare Other | Admitting: Pain Medicine

## 2021-02-21 ENCOUNTER — Other Ambulatory Visit: Payer: Self-pay

## 2021-02-21 ENCOUNTER — Ambulatory Visit
Admission: RE | Admit: 2021-02-21 | Discharge: 2021-02-21 | Disposition: A | Discharge status: Home / Self Care | Payer: Medicare Other | Source: Ambulatory Visit | Attending: Pain Medicine | Admitting: Pain Medicine

## 2021-02-21 VITALS — BP 146/65 | HR 79 | Temp 97.1°F | Resp 16 | Ht 69.0 in | Wt 228.0 lb

## 2021-02-21 DIAGNOSIS — Z451 Encounter for adjustment and management of infusion pump: Secondary | ICD-10-CM | POA: Diagnosis not present

## 2021-02-21 DIAGNOSIS — M790 Rheumatism, unspecified: Secondary | ICD-10-CM | POA: Insufficient documentation

## 2021-02-21 DIAGNOSIS — G894 Chronic pain syndrome: Secondary | ICD-10-CM | POA: Diagnosis not present

## 2021-02-21 DIAGNOSIS — R69 Illness, unspecified: Secondary | ICD-10-CM | POA: Insufficient documentation

## 2021-02-21 DIAGNOSIS — M545 Low back pain, unspecified: Secondary | ICD-10-CM | POA: Insufficient documentation

## 2021-02-21 DIAGNOSIS — T85620D Displacement of epidural and subdural infusion catheter, subsequent encounter: Secondary | ICD-10-CM

## 2021-02-21 DIAGNOSIS — M961 Postlaminectomy syndrome, not elsewhere classified: Secondary | ICD-10-CM | POA: Insufficient documentation

## 2021-02-21 DIAGNOSIS — M542 Cervicalgia: Secondary | ICD-10-CM | POA: Diagnosis not present

## 2021-02-21 DIAGNOSIS — Z978 Presence of other specified devices: Secondary | ICD-10-CM

## 2021-02-21 DIAGNOSIS — M5441 Lumbago with sciatica, right side: Secondary | ICD-10-CM | POA: Diagnosis not present

## 2021-02-21 DIAGNOSIS — F17200 Nicotine dependence, unspecified, uncomplicated: Secondary | ICD-10-CM | POA: Insufficient documentation

## 2021-02-21 DIAGNOSIS — Z79891 Long term (current) use of opiate analgesic: Secondary | ICD-10-CM | POA: Insufficient documentation

## 2021-02-21 DIAGNOSIS — G96198 Other disorders of meninges, not elsewhere classified: Secondary | ICD-10-CM | POA: Diagnosis not present

## 2021-02-21 DIAGNOSIS — M160 Bilateral primary osteoarthritis of hip: Secondary | ICD-10-CM

## 2021-02-21 DIAGNOSIS — M5137 Other intervertebral disc degeneration, lumbosacral region: Secondary | ICD-10-CM | POA: Diagnosis not present

## 2021-02-21 DIAGNOSIS — M25551 Pain in right hip: Secondary | ICD-10-CM | POA: Insufficient documentation

## 2021-02-21 DIAGNOSIS — M47812 Spondylosis without myelopathy or radiculopathy, cervical region: Secondary | ICD-10-CM

## 2021-02-21 DIAGNOSIS — Z79899 Other long term (current) drug therapy: Secondary | ICD-10-CM | POA: Insufficient documentation

## 2021-02-21 DIAGNOSIS — Z13828 Encounter for screening for other musculoskeletal disorder: Secondary | ICD-10-CM | POA: Insufficient documentation

## 2021-02-21 DIAGNOSIS — T85610S Breakdown (mechanical) of epidural and subdural infusion catheter, sequela: Secondary | ICD-10-CM | POA: Diagnosis not present

## 2021-02-21 DIAGNOSIS — I4589 Other specified conduction disorders: Secondary | ICD-10-CM | POA: Insufficient documentation

## 2021-02-21 DIAGNOSIS — G8929 Other chronic pain: Secondary | ICD-10-CM

## 2021-02-21 DIAGNOSIS — Z5189 Encounter for other specified aftercare: Secondary | ICD-10-CM

## 2021-02-21 DIAGNOSIS — M25552 Pain in left hip: Secondary | ICD-10-CM | POA: Diagnosis not present

## 2021-02-21 DIAGNOSIS — K589 Irritable bowel syndrome without diarrhea: Secondary | ICD-10-CM | POA: Insufficient documentation

## 2021-02-21 DIAGNOSIS — D500 Iron deficiency anemia secondary to blood loss (chronic): Secondary | ICD-10-CM | POA: Insufficient documentation

## 2021-02-21 MED ORDER — OXYCODONE-ACETAMINOPHEN 5-325 MG PO TABS: 1.0000 | ORAL_TABLET | Freq: Three times a day (TID) | ORAL | 0 refills | Status: DC | PRN

## 2021-02-21 MED ORDER — OXYCODONE-ACETAMINOPHEN 5-325 MG PO TABS
1.0000 | ORAL_TABLET | Freq: Three times a day (TID) | ORAL | 0 refills | Status: DC | PRN
Start: 2021-02-28 — End: 2021-04-20

## 2021-02-21 MED ORDER — LIDOCAINE HCL (PF) 2 % IJ SOLN
INTRAMUSCULAR | Status: AC
  Filled 2021-02-21: qty 5

## 2021-02-21 NOTE — Patient Instructions (Signed)
____________________________________________________________________________________________  Preparing for your procedure (without sedation)  Procedure appointments are limited to planned procedures: . No Prescription Refills. . No disability issues will be discussed. . No medication changes will be discussed.  Instructions: . Oral Intake: Do not eat or drink anything for at least 2 hours prior to your procedure. (Exception: Blood Pressure Medication. See below.) . Transportation: Unless otherwise stated by your physician, you may drive yourself after the procedure. . Blood Pressure Medicine: Do not forget to take your blood pressure medicine with a sip of water the morning of the procedure. If your Diastolic (lower reading)is above 100 mmHg, elective cases will be cancelled/rescheduled. . Blood thinners: These will need to be stopped for procedures. Notify our staff if you are taking any blood thinners. Depending on which one you take, there will be specific instructions on how and when to stop it. . Diabetics on insulin: Notify the staff so that you can be scheduled 1st case in the morning. If your diabetes requires high dose insulin, take only  of your normal insulin dose the morning of the procedure and notify the staff that you have done so. . Preventing infections: Shower with an antibacterial soap the morning of your procedure.  . Build-up your immune system: Take 1000 mg of Vitamin C with every meal (3 times a day) the day prior to your procedure. . Antibiotics: Inform the staff if you have a condition or reason that requires you to take antibiotics before dental procedures. . Pregnancy: If you are pregnant, call and cancel the procedure. . Sickness: If you have a cold, fever, or any active infections, call and cancel the procedure. . Arrival: You must be in the facility at least 30 minutes prior to your scheduled procedure. . Children: Do not bring any children with you. . Dress  appropriately: Bring dark clothing that you would not mind if they get stained. . Valuables: Do not bring any jewelry or valuables.  Reasons to call and reschedule or cancel your procedure: (Following these recommendations will minimize the risk of a serious complication.) . Surgeries: Avoid having procedures within 2 weeks of any surgery. (Avoid for 2 weeks before or after any surgery). . Flu Shots: Avoid having procedures within 2 weeks of a flu shots or . (Avoid for 2 weeks before or after immunizations). . Barium: Avoid having a procedure within 7-10 days after having had a radiological study involving the use of radiological contrast. (Myelograms, Barium swallow or enema study). . Heart attacks: Avoid any elective procedures or surgeries for the initial 6 months after a "Myocardial Infarction" (Heart Attack). . Blood thinners: It is imperative that you stop these medications before procedures. Let us know if you if you take any blood thinner.  . Infection: Avoid procedures during or within two weeks of an infection (including chest colds or gastrointestinal problems). Symptoms associated with infections include: Localized redness, fever, chills, night sweats or profuse sweating, burning sensation when voiding, cough, congestion, stuffiness, runny nose, sore throat, diarrhea, nausea, vomiting, cold or Flu symptoms, recent or current infections. It is specially important if the infection is over the area that we intend to treat. . Heart and lung problems: Symptoms that may suggest an active cardiopulmonary problem include: cough, chest pain, breathing difficulties or shortness of breath, dizziness, ankle swelling, uncontrolled high or unusually low blood pressure, and/or palpitations. If you are experiencing any of these symptoms, cancel your procedure and contact your primary care physician for an evaluation.  Remember:  Regular   Business hours are:  Monday to Thursday 8:00 AM to 4:00  PM  Provider's Schedule: Francisco Naveira, MD:  Procedure days: Tuesday and Thursday 7:30 AM to 4:00 PM  Bilal Lateef, MD:  Procedure days: Monday and Wednesday 7:30 AM to 4:00 PM ____________________________________________________________________________________________    

## 2021-02-21 NOTE — Progress Notes (Signed)
Nursing Pain Medication Assessment:  Safety precautions to be maintained throughout the outpatient stay will include: orient to surroundings, keep bed in low position, maintain call bell within reach at all times, provide assistance with transfer out of bed and ambulation.  Medication Inspection Compliance: Pill count conducted under aseptic conditions, in front of the patient. Neither the pills nor the bottle was removed from the patient's sight at any time. Once count was completed pills were immediately returned to the patient in their original bottle.  Medication: Oxycodone/APAP Pill/Patch Count:  30 of 90 pills remain Pill/Patch Appearance: Markings consistent with prescribed medication Bottle Appearance: Standard pharmacy container. Clearly labeled. Filled Date: 07 / 97 / 2022 Last Medication intake:  Today

## 2021-02-22 ENCOUNTER — Telehealth: Payer: Self-pay | Admitting: *Deleted

## 2021-02-22 NOTE — Telephone Encounter (Signed)
Attempted to call for post IT pump fill check. Message left.

## 2021-03-01 ENCOUNTER — Other Ambulatory Visit: Payer: Self-pay | Admitting: Cardiology

## 2021-03-07 DIAGNOSIS — G5603 Carpal tunnel syndrome, bilateral upper limbs: Secondary | ICD-10-CM | POA: Diagnosis not present

## 2021-03-12 MED FILL — Medication: INTRATHECAL | Qty: 1 | Status: AC

## 2021-03-16 ENCOUNTER — Ambulatory Visit (HOSPITAL_BASED_OUTPATIENT_CLINIC_OR_DEPARTMENT_OTHER): Payer: Medicare Other | Admitting: Pain Medicine

## 2021-03-16 ENCOUNTER — Other Ambulatory Visit: Payer: Self-pay

## 2021-03-16 ENCOUNTER — Ambulatory Visit
Admission: RE | Admit: 2021-03-16 | Discharge: 2021-03-16 | Disposition: A | Discharge status: Home / Self Care | Payer: Medicare Other | Source: Ambulatory Visit | Attending: Pain Medicine | Admitting: Pain Medicine

## 2021-03-16 ENCOUNTER — Encounter: Payer: Self-pay | Admitting: Pain Medicine

## 2021-03-16 VITALS — BP 157/84 | HR 76 | Temp 97.4°F | Resp 16 | Ht 70.0 in | Wt 218.0 lb

## 2021-03-16 DIAGNOSIS — M160 Bilateral primary osteoarthritis of hip: Secondary | ICD-10-CM

## 2021-03-16 DIAGNOSIS — G8929 Other chronic pain: Secondary | ICD-10-CM | POA: Insufficient documentation

## 2021-03-16 DIAGNOSIS — M47816 Spondylosis without myelopathy or radiculopathy, lumbar region: Secondary | ICD-10-CM

## 2021-03-16 DIAGNOSIS — Z978 Presence of other specified devices: Secondary | ICD-10-CM | POA: Insufficient documentation

## 2021-03-16 DIAGNOSIS — M25552 Pain in left hip: Secondary | ICD-10-CM | POA: Insufficient documentation

## 2021-03-16 DIAGNOSIS — M25551 Pain in right hip: Secondary | ICD-10-CM | POA: Diagnosis not present

## 2021-03-16 MED ORDER — METHYLPREDNISOLONE ACETATE 80 MG/ML IJ SUSP
160.0000 mg | Freq: Once | INTRAMUSCULAR | Status: AC
  Administered 2021-03-16: 160 mg via INTRA_ARTICULAR
  Filled 2021-03-16: qty 2

## 2021-03-16 MED ORDER — ROPIVACAINE HCL 2 MG/ML IJ SOLN: 8.0000 mL | Freq: Once | INTRAMUSCULAR | Status: DC

## 2021-03-16 MED ORDER — ROPIVACAINE HCL 2 MG/ML IJ SOLN
18.0000 mL | Freq: Once | INTRAMUSCULAR | Status: AC
  Administered 2021-03-16: 18 mL via INTRA_ARTICULAR

## 2021-03-16 MED ORDER — IOHEXOL 180 MG/ML  SOLN
10.0000 mL | Freq: Once | INTRAMUSCULAR | Status: AC
  Administered 2021-03-16: 5 mL via INTRA_ARTICULAR

## 2021-03-16 MED ORDER — LACTATED RINGERS IV SOLN: 1000.0000 mL | Freq: Once | INTRAVENOUS | Status: DC

## 2021-03-16 MED ORDER — PENTAFLUOROPROP-TETRAFLUOROETH EX AERO
INHALATION_SPRAY | Freq: Once | CUTANEOUS | Status: AC
  Administered 2021-03-16: 30 via TOPICAL
  Filled 2021-03-16: qty 116

## 2021-03-16 MED ORDER — SODIUM CHLORIDE (PF) 0.9 % IJ SOLN
INTRAMUSCULAR | Status: AC
  Filled 2021-03-16: qty 10

## 2021-03-16 MED ORDER — MIDAZOLAM HCL 5 MG/5ML IJ SOLN: 0.5000 mg | Freq: Once | INTRAMUSCULAR | Status: DC

## 2021-03-16 MED ORDER — LIDOCAINE HCL 2 % IJ SOLN
20.0000 mL | Freq: Once | INTRAMUSCULAR | Status: AC
  Administered 2021-03-16: 100 mg

## 2021-03-16 NOTE — Progress Notes (Signed)
Safety precautions to be maintained throughout the outpatient stay will include: orient to surroundings, keep bed in low position, maintain call bell within reach at all times, provide assistance with transfer out of bed and ambulation.  

## 2021-03-16 NOTE — Progress Notes (Signed)
PROVIDER NOTE: Information contained herein reflects review and annotations entered in association with encounter. Interpretation of such information and data should be left to medically-trained personnel. Information provided to patient can be located elsewhere in the medical record under "Patient Instructions". Document created using STT-dictation technology, any transcriptional errors that may result from process are unintentional.    Patient: Melinda [name]  Service Category: Procedure  Provider: Gaspar Cola, MD  DOB: August 09, 1955  DOS: 03/16/2021  Location: [facility]  MRN: [id number]  Setting: Ambulatory - outpatient  Referring Provider: [name], MD  Type: Established Patient  Specialty: Interventional Pain Management  PCP: Melinda Fenton, NP   Primary Reason for Visit: Interventional Pain Management Treatment. CC: Back Pain (Right buttock)   Procedure:          Anesthesia, Analgesia, Anxiolysis:  Type: Intra-Articular Hip Injection #1  Primary Purpose: Diagnostic Region: Posterolateral hip joint area. Level: Lower pelvic and hip joint level. Target Area: Superior aspect of the hip joint cavity, going thru the superior portion of the capsular ligament. Approach: Posterolateral approach. Laterality: Bilateral  Type: Local Anesthesia Local Anesthetic: Lidocaine 1-2% Sedation: None  Indication(s): Anxiety & Analgesia Route: Infiltration ([place]/IM) IV Access: Declined   Position: Lateral Decubitus with bad side up Prepped Area: Entire Posterolateral hip area. DuraPrep (Iodine Povacrylex [0.7% available iodine] and Isopropyl Alcohol, 74% w/w)   Indications: 1. Chronic hip pain (Bilateral)   2. Osteoarthritis of hips (Bilateral)   3. 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  However, she has been experiencing more low back pain that seems to be going down the back of the right leg with the primary pain being in the buttocks area, down to the ankle but not into the foot.  Provocative Kemp maneuver was positive bilaterally for facet joint arthralgia and decreased range of motion of the lumbar spine.  Today I have informed patient that we will proceed with the planned injection of both hips, but it is likely to help with the hip pain and not so much with the low back pain from the lumbar facet syndrome.  However because she may experience some systemic benefit from the steroid, we I plan to hold on any treatments of the lumbar spine today, but we may consider addressing that on the next visit if it does not improve.  To help with the discomfort today I have ordered her intrathecal pump to be increased by 10%.  Pre-op H&P Assessment:  Melinda [name] is a 65 y.o. (year old), female patient, seen today for interventional treatment. 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 Melinda [name] has a  current medication list which includes the following prescription(s): accu-chek aviva plus, accu-chek guide, accu-chek softclix lancets, AMBULATORY NON FORMULARY MEDICATION, vitamin c, aspirin, b complex vitamins, biotin, accu-chek guide control, bupropion, calcium-magnesium-zinc, vitamin d, cinnamon, ezetimibe, glucosamine-chondroitin, insulin pen needle, magnesium, nitroglycerin, NONFORMULARY OR COMPOUNDED ITEM, fish oil, oxycodone-acetaminophen, [START ON 03/30/2021] oxycodone-acetaminophen, [START ON 04/29/2021] oxycodone-acetaminophen, ozempic (0.25 or 0.5 mg/dose), paroxetine, polyethylene glycol, pregabalin, rosuvastatin, tizanidine, and vitamin e, and the following Facility-Administered Medications: ropivacaine (pf) 2 mg/ml (0.2%). Her primarily concern today is the Back Pain (Right buttock)  Initial Vital Signs:  Pulse/HCG Rate: 76ECG Heart Rate: 74 Temp: (!) 97.4 F (36.3 C) Resp: 16 BP: 135/87 SpO2: 99 %  BMI: Estimated body mass index is 31.28 kg/m as calculated from the following:   Height as of this encounter: 5\' 10"  (1.778 m).   Weight as of this encounter: 218 lb (98.9 kg).  Risk Assessment: Allergies: Reviewed. She has No Known Allergies.  Allergy Precautions: None required Coagulopathies: Reviewed. None identified.  Blood-thinner therapy: None at this time Active Infection(s): Reviewed. None identified. Melinda [name] is afebrile  Site Confirmation: Melinda [name] was asked to confirm the procedure and laterality before marking the site Procedure checklist: Completed Consent: Before the procedure and under the influence of no sedative(s), amnesic(s), or anxiolytics, the patient was informed of the treatment options, risks and possible complications. To fulfill our ethical and legal obligations, as recommended by the American Medical Association's Code of Ethics, I have informed the patient of my clinical impression; the  nature and purpose of the treatment or procedure; the risks, benefits, and possible complications of the intervention; the alternatives, including doing nothing; the risk(s) and benefit(s) of the alternative treatment(s) or procedure(s); and the risk(s) and benefit(s) of doing nothing. The patient was provided information about the general risks and possible complications associated with the procedure. These may include, but are not limited to: failure to achieve desired goals, infection, bleeding, organ or nerve damage, allergic reactions, paralysis, and death. In addition, the patient was informed of those risks and complications associated to the procedure, such as failure to decrease pain; infection; bleeding; organ or nerve damage with subsequent damage to sensory, motor, and/or autonomic systems, resulting in permanent pain, numbness, and/or weakness of one or several areas of the body; allergic reactions; (i.e.: anaphylactic reaction); and/or death. Furthermore, the patient was informed of those risks and complications associated with the medications. These include, but are not limited to: allergic reactions (i.e.: anaphylactic or anaphylactoid reaction(s)); adrenal axis suppression; blood sugar elevation that in diabetics may result in ketoacidosis or comma; water retention that in patients with history of congestive heart failure may result in shortness of breath, pulmonary edema, and decompensation with resultant heart failure; weight gain; swelling or edema; medication-induced neural toxicity; particulate matter embolism and blood vessel occlusion with resultant organ, and/or nervous system infarction; and/or aseptic necrosis of one or more joints. Finally, the patient was informed that Medicine is not an exact science; therefore, there is also the possibility of unforeseen or unpredictable risks and/or possible complications that may result in a catastrophic outcome. The patient indicated having  understood very clearly. We have given the patient no guarantees and we have made no promises. Enough time was given to the patient to ask questions, all of which were answered to the patient's satisfaction. Ms. Reddix has indicated that she wanted to continue with the procedure. Attestation: I, the ordering provider, attest that I have discussed with the patient the benefits, risks, side-effects, alternatives, likelihood  of achieving goals, and potential problems during recovery for the procedure that I have provided informed consent. Date  Time: 03/16/2021 11:23 AM  Pre-Procedure Preparation:  Monitoring: As per clinic protocol. Respiration, ETCO2, SpO2, BP, heart rate and rhythm monitor placed and checked for adequate function Safety Precautions: Patient was assessed for positional comfort and pressure points before starting the procedure. Time-out: I initiated and conducted the "Time-out" before starting the procedure, as per protocol. The patient was asked to participate by confirming the accuracy of the "Time Out" information. Verification of the correct person, site, and procedure were performed and confirmed by me, the nursing staff, and the patient. "Time-out" conducted as per Joint Commission's Universal Protocol (UP.01.01.01). Time: 1218  Description of Procedure:          Safety Precautions: Aspiration looking for blood return was conducted prior to all injections. At no point did we inject any substances, as a needle was being advanced. No attempts were made at seeking any paresthesias. Safe injection practices and needle disposal techniques used. Medications properly checked for expiration dates. SDV (single dose vial) medications used. Description of the Procedure: Protocol guidelines were followed. The patient was placed in position over the fluoroscopy table. The target area was identified and the area prepped in the usual manner. Skin & deeper tissues infiltrated with local anesthetic.  Appropriate amount of time allowed to pass for local anesthetics to take effect. The procedure needles were then advanced to the target area. Proper needle placement secured. Negative aspiration confirmed. Solution injected in intermittent fashion, asking for systemic symptoms every 0.5cc of injectate. The needles were then removed and the area cleansed, making sure to leave some of the prepping solution back to take advantage of its long term bactericidal properties. Vitals:   03/16/21 1215 03/16/21 1220 03/16/21 1225 03/16/21 1231  BP: (!) 160/89 138/80 (!) 146/101 (!) 157/84  Pulse:      Resp: 18 16 18 16   Temp:      SpO2: 99% 98% 97% 99%  Weight:      Height:        Start Time: 1219 hrs. End Time: 1231 hrs. Materials:  Needle(s) Type: Spinal Needle Gauge: 22G Length: 7.0-in Medication(s): Please see orders for medications and dosing details.  Imaging Guidance (Non-Spinal):          Type of Imaging Technique: Fluoroscopy Guidance (Non-Spinal) Indication(s): Assistance in needle guidance and placement for procedures requiring needle placement in or near specific anatomical locations not easily accessible without such assistance. Exposure Time: Please see nurses notes. Contrast: Before injecting any contrast, we confirmed that the patient did not have an allergy to iodine, shellfish, or radiological contrast. Once satisfactory needle placement was completed at the desired level, radiological contrast was injected. Contrast injected under live fluoroscopy. No contrast complications. See chart for type and volume of contrast used. Fluoroscopic Guidance: I was personally present during the use of fluoroscopy. "Tunnel Vision Technique" used to obtain the best possible view of the target area. Parallax error corrected before commencing the procedure. "Direction-depth-direction" technique used to introduce the needle under continuous pulsed fluoroscopy. Once target was reached, antero-posterior,  oblique, and lateral fluoroscopic projection used confirm needle placement in all planes. Images permanently stored in EMR. Interpretation: I personally interpreted the imaging intraoperatively. Adequate needle placement confirmed in multiple planes. Appropriate spread of contrast into desired area was observed. No evidence of afferent or efferent intravascular uptake. Permanent images saved into the patient's record.  Antibiotic Prophylaxis:   Anti-infectives (From  admission, onward)    None      Indication(s): None identified  Post-operative Assessment:  Post-procedure Vital Signs:  Pulse/HCG Rate: 7676 Temp: (!) 97.4 F (36.3 C) Resp: 16 BP: (!) 157/84 SpO2: 99 %  EBL: None  Complications: No immediate post-treatment complications observed by team, or reported by patient.  Note: The patient tolerated the entire procedure well. A repeat set of vitals were taken after the procedure and the patient was kept under observation following institutional policy, for this type of procedure. Post-procedural neurological assessment was performed, showing return to baseline, prior to discharge. The patient was provided with post-procedure discharge instructions, including a section on how to identify potential problems. Should any problems arise concerning this procedure, the patient was given instructions to immediately contact us, at any time, without hesitation. In any case, we plan to contact the patient by telephone for a follow-up status report regarding this interventional procedure.  Comments:  No additional relevant information.  Plan of Care  Orders:  Orders Placed This Encounter  Procedures   HIP INJECTION    Scheduling Instructions:     Side: Bilateral     Sedation: Patient's choice.     Timeframe: Today   PUMP REPROGRAM    Follow programming protocol by having two(2) healthcare providers present during programming.    Scheduling Instructions:     Please perform the  following adjustment: Increase rate by 10%.    Order Specific Question:   Where will this procedure be performed?    Answer:   [facility]   DG PAIN [facility] C-ARM 1-60 MIN NO REPORT    Intraoperative interpretation by procedural physician at [place].    Standing Status:   Standing    Number of Occurrences:   1    Order Specific Question:   Reason for exam:    Answer:   Assistance in needle guidance and placement for procedures requiring needle placement in or near specific anatomical locations not easily accessible without such assistance.   Informed Consent Details: Physician/Practitioner Attestation; Transcribe to consent form and obtain patient signature    Nursing Order: Transcribe to consent form and obtain patient signature. Note: Always confirm laterality of pain with Ms. Chana Bode, before procedure.    Order Specific Question:   Physician/Practitioner attestation of informed consent for procedure/surgical case    Answer:   I, the physician/practitioner, attest that I have discussed with the patient the benefits, risks, side effects, alternatives, likelihood of achieving goals and potential problems during recovery for the procedure that I have provided informed consent.    Order Specific Question:   Procedure    Answer:   Hip injection    Order Specific Question:   Physician/Practitioner performing the procedure    Answer:   Izza Bickle A. Dossie Arbour, MD    Order Specific Question:   Indication/Reason    Answer:   Hip Joint Pain (Arthralgia)   Provide equipment / supplies at bedside    "Block Tray" (Disposable  single use) Needle type: SpinalSpinal Amount/quantity: 2 Size: Long (7-inch) Gauge: 22G    Standing Status:   Standing    Number of Occurrences:   1    Order Specific Question:   Specify    Answer:   Block Tray    Chronic Opioid Analgesic:  Oxycodone 5 mg, 1 tab PO q 8 hrs (15 mg/day of oxycodone) + Intrathecal PF-Fentanyl MME/day: 22.5 mg/day (oral).    Medications ordered for procedure: Meds ordered this encounter  Medications   iohexol (OMNIPAQUE) 180 MG/ML injection 10 mL    Must be Myelogram-compatible. If not available, you may substitute with a water-soluble, non-ionic, hypoallergenic, myelogram-compatible radiological contrast medium.   lidocaine (XYLOCAINE) 2 % (with pres) injection 400 mg   pentafluoroprop-tetrafluoroeth (GEBAUERS) aerosol   DISCONTD: lactated ringers infusion 1,000 mL   DISCONTD: midazolam (VERSED) 5 MG/5ML injection 0.5-2 mg    Make sure Flumazenil is available in the pyxis when using this medication. If oversedation occurs, administer 0.2 mg IV over 15 sec. If after 45 sec no response, administer 0.2 mg again over 1 min; may repeat at 1 min intervals; not to exceed 4 doses (1 mg)   ropivacaine (PF) 2 mg/mL (0.2%) (NAROPIN) injection 8 mL   ropivacaine (PF) 2 mg/mL (0.2%) (NAROPIN) injection 18 mL   methylPREDNISolone acetate (DEPO-MEDROL) injection 160 mg    Medications administered: We administered iohexol, lidocaine, pentafluoroprop-tetrafluoroeth, ropivacaine (PF) 2 mg/mL (0.2%), and methylPREDNISolone acetate.  See the medical record for exact dosing, route, and time of administration.  Follow-up plan:   Return in about 2 weeks (around 03/30/2021) for Proc-day (T,Th), (VV), (PPE).       Interventional Therapies  Risk  Complexity Considerations:   Estimated body mass index is 33.45 kg/m as calculated from the following:   Height as of this encounter: 5\' 8"  (1.727 m).   Weight as of this encounter: 220 lb (99.8 kg). WNL   Planned  Pending:   Intrathecal pump pocket revision and reanchoring of device.   Under consideration:   Possible left lumbar facet RFA  Possible right lumbar facet RFA  Diagnostic caudal ESI + diagnostic epidurogram  Possible Racz procedure  Diagnostic bilateral IA knee injection (Steroid) Diagnostic bilateral genicular NB  Possible bilateral genicular nerve RFA   Possible bilateral Hyalgan knee injection  Diagnostic cervical ESI  Diagnostic bilateral cervical facet block  Possible bilateral cervical facet RFA    Completed:   Diagnostic/therapeutic left L2-3 LESI x1  Diagnostic/therapeutic right lumbar facet block x3  Diagnostic/therapeutic left lumbar facet block x1    Therapeutic  Palliative (PRN) options:   Palliative/therapeutic intrathecal pump management (refills/programming adjustments)  Palliative left L2-3 LESI #2  Palliative right lumbar facet block #4  Diagnostic left lumbar facet block #2       Recent Visits Date Type Provider Dept  03/16/21 Procedure visit Melinda Pointer, MD [facility]  02/21/21 Procedure visit Melinda Pointer, MD [facility]  Showing recent visits within past 90 days and meeting all other requirements Future Appointments Date Type Provider Dept  04/04/21 Appointment Melinda Pointer, MD [facility]  04/20/21 Appointment Melinda Pointer, MD [facility]  Showing future appointments within next 90 days and meeting all other requirements Disposition: Discharge home  Discharge (Date  Time): 03/16/2021; 1245 hrs.   Primary Care Physician: Melinda Fenton, NP Location: [facility] Note by: Melinda Cola, MD Date: 03/16/2021; Time: 10:42 AM  Disclaimer:  Medicine is not an Chief Strategy Officer. The only guarantee in medicine is that nothing is guaranteed. It is important to note that the decision to proceed with this intervention was based on the information collected from the patient. The Data and conclusions were drawn from the patient's questionnaire, the interview, and the physical examination. Because the information was provided in large part by the patient, it cannot be guaranteed that it has not been purposely or unconsciously manipulated. Every effort has been made to obtain as much relevant data as  possible for this evaluation.  It is important to note that the conclusions that lead to this procedure are derived in large part from the available data. Always take into account that the treatment will also be dependent on availability of resources and existing treatment guidelines, considered by other Pain Management Practitioners as being common knowledge and practice, at the time of the intervention. For Medico-Legal purposes, it is also important to point out that variation in procedural techniques and pharmacological choices are the acceptable norm. The indications, contraindications, technique, and results of the above procedure should only be interpreted and judged by a Board-Certified Interventional Pain Specialist with extensive familiarity and expertise in the same exact procedure and technique.

## 2021-03-16 NOTE — Patient Instructions (Addendum)
____________________________________________________________________________________________  Post-Procedure Discharge Instructions  Instructions:  Apply ice:   Purpose: This will minimize any swelling and discomfort after procedure.   When: Day of procedure, as soon as you get home.  How: Fill a plastic sandwich bag with crushed ice. Cover it with a small towel and apply to injection site.  How long: (15 min on, 15 min off) Apply for 15 minutes then remove x 15 minutes.  Repeat sequence on day of procedure, until you go to bed.  Apply heat:   Purpose: To treat any soreness and discomfort from the procedure.  When: Starting the next day after the procedure.  How: Apply heat to procedure site starting the day following the procedure.  How long: May continue to repeat daily, until discomfort goes away.  Food intake: Start with clear liquids (like water) and advance to regular food, as tolerated.   Physical activities: Keep activities to a minimum for the first 8 hours after the procedure. After that, then as tolerated.  Driving: If you have received any sedation, be responsible and do not drive. You are not allowed to drive for 24 hours after having sedation.  Blood thinner: (Applies only to those taking blood thinners) You may restart your blood thinner 6 hours after your procedure.  Insulin: (Applies only to Diabetic patients taking insulin) As soon as you can eat, you may resume your normal dosing schedule.  Infection prevention: Keep procedure site clean and dry. Shower daily and clean area with soap and water.  Post-procedure Pain Diary: Extremely important that this be done correctly and accurately. Recorded information will be used to determine the next step in treatment. For the purpose of accuracy, follow these rules:  Evaluate only the area treated. Do not report or include pain from an untreated area. For the purpose of this evaluation, ignore all other areas of pain,  except for the treated area.  After your procedure, avoid taking a long nap and attempting to complete the pain diary after you wake up. Instead, set your alarm clock to go off every hour, on the hour, for the initial 8 hours after the procedure. Document the duration of the numbing medicine, and the relief you are getting from it.  Do not go to sleep and attempt to complete it later. It will not be accurate. If you received sedation, it is likely that you were given a medication that may cause amnesia. Because of this, completing the diary at a later time may cause the information to be inaccurate. This information is needed to plan your care.  Follow-up appointment: Keep your post-procedure follow-up evaluation appointment after the procedure (usually 2 weeks for most procedures, 6 weeks for radiofrequencies). DO NOT FORGET to bring you pain diary with you.   Expect: (What should I expect to see with my procedure?)  From numbing medicine (AKA: Local Anesthetics): Numbness or decrease in pain. You may also experience some weakness, which if present, could last for the duration of the local anesthetic.  Onset: Full effect within 15 minutes of injected.  Duration: It will depend on the type of local anesthetic used. On the average, 1 to 8 hours.   From steroids (Applies only if steroids were used): Decrease in swelling or inflammation. Once inflammation is improved, relief of the pain will follow.  Onset of benefits: Depends on the amount of swelling present. The more swelling, the longer it will take for the benefits to be seen. In some cases, up to 10 days.    Duration: Steroids will stay in the system x 2 weeks. Duration of benefits will depend on multiple posibilities including persistent irritating factors.  Side-effects: If present, they may typically last 2 weeks (the duration of the steroids).  Frequent: Cramps (if they occur, drink Gatorade and take over-the-counter Magnesium 450-500 mg  once to twice a day); water retention with temporary weight gain; increases in blood sugar; decreased immune system response; increased appetite.  Occasional: Facial flushing (red, warm cheeks); mood swings; menstrual changes.  Uncommon: Long-term decrease or suppression of natural hormones; bone thinning. (These are more common with higher doses or more frequent use. This is why we prefer that our patients avoid having any injection therapies in other practices.)   Very Rare: Severe mood changes; psychosis; aseptic necrosis.  From procedure: Some discomfort is to be expected once the numbing medicine wears off. This should be minimal if ice and heat are applied as instructed.  Call if: (When should I call?)  You experience numbness and weakness that gets worse with time, as opposed to wearing off.  New onset bowel or bladder incontinence. (Applies only to procedures done in the spine)  Emergency Numbers:  Durning business hours (Monday - Thursday, 8:00 AM - 4:00 PM) (Friday, 9:00 AM - 12:00 Noon): [phone]  After hours: [phone]  NOTE: If you are having a problem and are unable connect with, or to talk to a provider, then go to your nearest urgent care or emergency department. If the problem is serious and urgent, please call 911. ____________________________________________________________________________________________   Pain Management Discharge Instructions  General Discharge Instructions :  If you need to reach your doctor call: Monday-Friday 8:00 am - 4:00 pm at [phone] or toll free [phone].  After clinic hours [phone] to have operator reach doctor.  Bring all of your medication bottles to all your appointments in the pain clinic.  To cancel or reschedule your appointment with Pain Management please remember to call 24 hours in advance to avoid a fee.  Refer to the educational materials which you have been given on: General Risks, I had my  Procedure. Discharge Instructions, Post Sedation.  Post Procedure Instructions:  The drugs you were given will stay in your system until tomorrow, so for the next 24 hours you should not drive, make any legal decisions or drink any alcoholic beverages.  You may eat anything you prefer, but it is better to start with liquids then soups and crackers, and gradually work up to solid foods.  Please notify your doctor immediately if you have any unusual bleeding, trouble breathing or pain that is not related to your normal pain.  Depending on the type of procedure that was done, some parts of your body may feel week and/or numb.  This usually clears up by tonight or the next day.  Walk with the use of an assistive device or accompanied by an adult for the 24 hours.  You may use ice on the affected area for the first 24 hours.  Put ice in a Ziploc bag and cover with a towel and place against area 15 minutes on 15 minutes off.  You may switch to heat after 24 hours. 

## 2021-03-17 ENCOUNTER — Telehealth: Payer: Self-pay

## 2021-03-17 DIAGNOSIS — G4733 Obstructive sleep apnea (adult) (pediatric): Secondary | ICD-10-CM | POA: Diagnosis not present

## 2021-03-17 NOTE — Telephone Encounter (Signed)
Post procedure phone call.  Patient states she is doing better.

## 2021-03-24 DIAGNOSIS — M5416 Radiculopathy, lumbar region: Secondary | ICD-10-CM | POA: Diagnosis not present

## 2021-03-28 ENCOUNTER — Other Ambulatory Visit: Payer: Self-pay

## 2021-03-28 ENCOUNTER — Other Ambulatory Visit: Payer: Self-pay | Admitting: Orthopedic Surgery

## 2021-03-28 DIAGNOSIS — M545 Low back pain, unspecified: Secondary | ICD-10-CM

## 2021-03-28 MED ORDER — PAIN MANAGEMENT IT PUMP REFILL: 1.0000 | Freq: Once | INTRATHECAL | 0 refills | Status: AC

## 2021-03-30 ENCOUNTER — Other Ambulatory Visit: Payer: Self-pay | Admitting: Orthopedic Surgery

## 2021-03-30 DIAGNOSIS — M545 Low back pain, unspecified: Secondary | ICD-10-CM

## 2021-03-31 ENCOUNTER — Other Ambulatory Visit: Payer: Self-pay

## 2021-03-31 ENCOUNTER — Ambulatory Visit
Admission: RE | Admit: 2021-03-31 | Discharge: 2021-03-31 | Disposition: A | Discharge status: Home / Self Care | Payer: Medicare Other | Source: Ambulatory Visit | Attending: Orthopedic Surgery | Admitting: Orthopedic Surgery

## 2021-03-31 DIAGNOSIS — M545 Low back pain, unspecified: Secondary | ICD-10-CM | POA: Insufficient documentation

## 2021-03-31 DIAGNOSIS — M5126 Other intervertebral disc displacement, lumbar region: Secondary | ICD-10-CM | POA: Diagnosis not present

## 2021-03-31 DIAGNOSIS — M47816 Spondylosis without myelopathy or radiculopathy, lumbar region: Secondary | ICD-10-CM | POA: Diagnosis not present

## 2021-04-03 NOTE — Progress Notes (Signed)
Patient: Melinda [name]  Service Category: E/M  Provider: Gaspar Cola, MD  DOB: [birth year]  DOS: 04/04/2021  Location: Office  MRN: [id number]  Setting: Ambulatory outpatient  Referring Provider: Jearld Fenton, NP  Type: Established Patient  Specialty: Interventional Pain Management  PCP: Jearld Fenton, NP  Location: Remote location  Delivery: TeleHealth     Virtual Encounter - Pain Management PROVIDER NOTE: Information contained herein reflects review and annotations entered in association with encounter. Interpretation of such information and data should be left to medically-trained personnel. Information provided to patient can be located elsewhere in the medical record under "Patient Instructions". Document created using STT-dictation technology, any transcriptional errors that may result from process are unintentional.    Contact & Pharmacy Preferred: [phone] Home: [phone] (home) Mobile: [phone] (mobile) E-mail: [email or web address]  [facility], [place] [address] [place] [address] Phone: [phone] Fax: [phone]   Pre-screening  Ms. Chana Bode offered "in-person" vs "virtual" encounter. She indicated preferring virtual for this encounter.   Reason COVID-19*  Social distancing based on CDC and AMA recommendations.   I contacted Lubertha South on 04/04/2021 via telephone.      I clearly identified myself as Gaspar Cola, MD. I verified that I was speaking with the correct person using two identifiers (Name: Cahterine Heinzel, and date of birth: January 08, 1956).  Consent I sought verbal advanced consent from Lubertha South for virtual visit interactions. I informed Ms. Spickler of possible security and privacy concerns, risks, and limitations associated with providing "not-in-person" medical evaluation and management services. I also informed Ms. Jagiello of the availability of "in-person" appointments. Finally, I  informed her that there would be a charge for the virtual visit and that she could be  personally, fully or partially, financially responsible for it. Ms. Santore expressed understanding and agreed to proceed.   Historic Elements   Ms. Ameya Vowell is a 65 y.o. year old, female patient evaluated today after our last contact on 03/16/2021. Ms. Behan  has a past medical history of Amputation of great toe, right, traumatic ([place]) (05/30/2010), Amputation of second toe, right, traumatic ([place]) (09/2017), Anginal pain ([place]), Anxiety, Blue toes, Bulging disc, CAD (coronary artery disease) (2009), Cardiac arrhythmia due to congenital heart disease (1992), Chicken pox, Chronic fatigue, Chronic kidney disease, Coronary arteritis, Degenerative disc disease, Depression, Diabetes mellitus without complication ([place]), Facet joint disease, Fibromyalgia, Heart disease, Hyperlipidemia, IBS (irritable bowel syndrome), MCL deficiency, knee, MRSA (methicillin resistant staph aureus) culture positive (2011), Neuropathy (04/18/2010), Orthostatic hypotension (01/2020), Peripheral neuropathy, Renal insufficiency, Restless leg syndrome, Sepsis ([place]) (09/2013), Sleep apnea (08/17/2003), and Spinal stenosis. She also  has a past surgical history that includes Foot surgery (Right); Hand surgery (Left); Gallbladder surgery; Ablation; Ablation; HEART STENT (2009); HAND SURGEY (Left); Foot surgery (Bilateral); Foot surgery (Right); Infusion pump implantation; Back surgery; Cholecystectomy (2003); Anterior cervical decomp/discectomy fusion (N/A, 07/28/2014); Carpal tunnel release (Right); Eye surgery (Bilateral, 2013); Amputation toe (Right, 02/01/2017); Irrigation and debridement foot (Right, 02/23/2017); Toe Surgery; Hammer toe surgery (Right, 10/17/2017); Intrathecal pump revision (N/A, 04/25/2018); Intrathecal pump revision (Right, 04/25/2018); Coronary angioplasty (2008); Pain pump revision (N/A, 08/15/2018); Intrathecal pump revision  (N/A, 12/12/2018); Intrathecal pump revision (N/A, 03/11/2020); Knee surgery (Right, 05/24/2020); Total knee arthroplasty (Right, 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 Ms. Colquhoun has a current medication list which includes the following prescription(s): accu-chek aviva plus, accu-chek guide, accu-chek softclix lancets, AMBULATORY NON FORMULARY MEDICATION, vitamin c, aspirin, b complex vitamins, biotin, accu-chek guide  control, bupropion, calcium-magnesium-zinc, vitamin d, cinnamon, ezetimibe, glucosamine-chondroitin, insulin pen needle, magnesium, nitroglycerin, NONFORMULARY OR COMPOUNDED ITEM, fish oil, oxycodone-acetaminophen, [START ON 04/29/2021] oxycodone-acetaminophen, ozempic (0.25 or 0.5 mg/dose), paroxetine, polyethylene glycol, pregabalin, rosuvastatin, tizanidine, vitamin e, and oxycodone-acetaminophen. She  reports that she quit smoking about 13 years ago. Her smoking use included cigarettes. She has a 48.00 pack-year smoking history. She has never used smokeless tobacco. She reports current alcohol use. She reports that she does not use drugs. Ms. Goosby has No Known Allergies.   HPI  Today, she is being contacted for a post-procedure assessment.  The patient indicates having attained only 50% relief of the pain for the duration of the local anesthetic which at this point has persisted.  However, she still having a lot of pain in the lower back and because of this she went on to see Dr. Yevette Edwards who ordered her lumbar MRI to be repeated.  The MRI essentially showed the same pathology that she had on the prior MRI done earlier this year.  There does not seem to have been any significant changes in that MRI.  Today I took the time to go over that MRI in great detail and explained to the patient in layman's terms what each finding meant and how it could be affecting her pain and where she would be experiencing the pain.  We talked about the worst pain being in the lower back as  opposed to the legs and vice versa and what that meant.  We went over prior diagnostic treatments and I reminded her that we had done prior lumbar facet blocks the last of which was done on 06/23/2019 where she had pain 100% relief of the pain for the duration of the local anesthetic followed by a more than 75% relief that persisted for more than 2 weeks and in fact, probably persisted a lot longer than that since it allowed her pump to regain control of her low back pain.  Currently she complains of pain in the lower back as her primary source of pain with bilateral lower extremity pain running through the back of the leg primarily in the area of the buttocks and in the back of the upper leg down to the knee and occasionally going down to the ankle, but never getting into the foot which is classical of referred pain from the facet joints.  While in the past she has experienced radicular types of pain, but this is not 1 of those times.  However, when you look at the MRI she does have multiple areas of spinal stenosis both central as well as foraminal.  She also has some areas of subarticular stenosis, once again secondary to the facet hypertrophy combined with disc osteophyte complexes.  During our conversation today I brought up the possibility that we could again do a bilateral lumbar facet block and if this completely eliminated the pain that she is currently having, perhaps the best course of action would be to do a radiofrequency ablation rather than a more extensive surgery.  I reminded her that the radiofrequency ablation of the lumbar facets is something that can be done at this point but it would not be an option if she was to have a decompressive laminectomy, facetectomy, and possible fusion using pedicle screws.  She understood and accepted and she has decided to talk it over with her wife and she indicated that she would then let me know what their decision is regarding her neck  step.  Post-Procedure Evaluation  Procedure (03/16/2021):  Procedure:           Anesthesia, Analgesia, Anxiolysis:  Type: Intra-Articular Hip Injection #1  Primary Purpose: Diagnostic Region: Posterolateral hip joint area. Level: Lower pelvic and hip joint level. Target Area: Superior aspect of the hip joint cavity, going thru the superior portion of the capsular ligament. Approach: Posterolateral approach. Laterality: Bilateral   Type: Local Anesthesia Local Anesthetic: Lidocaine 1-2% Sedation: None  Indication(s): Anxiety & Analgesia Route: Infiltration ([place]/IM) IV Access: Declined     Position: Lateral Decubitus with bad side up Prepped Area: Entire Posterolateral hip area. DuraPrep (Iodine Povacrylex [0.7% available iodine] and Isopropyl Alcohol, 74% w/w)    Indications: 1. Chronic hip pain (Bilateral)   2. Osteoarthritis of hips (Bilateral)   3. Presence of intrathecal pump (Medtronic intrathecal programmable pump) (40 mL pump)     Pain Score: Pre-procedure: 5 /10 Post-procedure: 5 /10   Anxiolysis: Please see nurses note.  Effectiveness during initial hour after procedure (Ultra-Short Term Relief): 50 %.  Local anesthetic used: Long-acting (4-6 hours) Effectiveness: Defined as any analgesic benefit obtained secondary to the administration of local anesthetics. This carries significant diagnostic value as to the etiological location, or anatomical origin, of the pain. Duration of benefit is expected to coincide with the duration of the local anesthetic used.  Effectiveness during initial 4-6 hours after procedure (Short-Term Relief): 50 %.  Long-term benefit: Defined as any relief past the pharmacologic duration of the local anesthetics.  Effectiveness past the initial 6 hours after procedure (Long-Term Relief): 50 %.  Benefits, current: Defined as benefit present at the time of this evaluation.   Analgesia: The patient indicates still having an ongoing 50% relief of her  pain and today she rates her pain as a 2/10. Function: Ms. Gunderman reports improvement in function.  Today she indicates that standing from prolonged periods of time usually will aggravate the pain as well as rotation of the lumbar spine at the level of the waist, especially if she is doing any, lifting, which is typical of facet joint arthropathy. ROM: Ms. Rieser reports improvement in ROM.  Pharmacotherapy Assessment   Analgesic: Oxycodone 5 mg, 1 tab PO q 8 hrs (15 mg/day of oxycodone) + Intrathecal PF-Fentanyl MME/day: 22.5 mg/day (oral).   Monitoring: [place] PMP: PDMP reviewed during this encounter.       Pharmacotherapy: No side-effects or adverse reactions reported. Compliance: No problems identified. Effectiveness: Clinically acceptable. Plan: Refer to "POC". UDS:  Summary  Date Value Ref Range Status  07/24/2018 FINAL  Final    Comment:    ==================================================================== TOXASSURE SELECT 13 (MW) ==================================================================== Test                             Result       Flag       Units Drug Present and Declared for Prescription Verification   Oxycodone                      754          EXPECTED   ng/mg creat   Oxymorphone                    231          EXPECTED   ng/mg creat   Noroxycodone                   2463  EXPECTED   ng/mg creat   Noroxymorphone                 89           EXPECTED   ng/mg creat    Sources of oxycodone are scheduled prescription medications.    Oxymorphone, noroxycodone, and noroxymorphone are expected    metabolites of oxycodone. Oxymorphone is also available as a    scheduled prescription medication. Drug Present not Declared for Prescription Verification   Fentanyl                       4            UNEXPECTED ng/mg creat   Norfentanyl                    29           UNEXPECTED ng/mg creat    Source of fentanyl is a scheduled prescription medication,    including  IV, patch, and transmucosal formulations. Norfentanyl    is an expected metabolite of fentanyl. ==================================================================== Test                      Result    Flag   Units      Ref Range   Creatinine              134              mg/dL      >=20 ==================================================================== Declared Medications:  The flagging and interpretation on this report are based on the  following declared medications.  Unexpected results may arise from  inaccuracies in the declared medications.  **Note: The testing scope of this panel includes these medications:  Oxycodone  **Note: The testing scope of this panel does not include following  reported medications:  Aspirin (Aspirin 81)  Bupropion (Wellbutrin)  Chondroitin (Glucosamine-Chondroitin)  Cinnamon  Glipizide (Glucotrol)  Glucosamine (Glucosamine-Chondroitin)  Magnesium  Multivitamin  Naloxone  Nitroglycerin (Nitrostat)  Paroxetine  Polyethylene Glycol (GlycoLAX)  Polyethylene Glycol (MiraLAX)  Pregabalin (Lyrica)  Rosuvastatin (Crestor)  Supplement (Omega-3)  Tizanidine (Zanaflex)  Turmeric  Ubiquinone (Coenzyme Q 10)  Vitamin D2 (Drisdol)  Vitamin E ==================================================================== For clinical consultation, please call [phone]. ====================================================================      Laboratory Chemistry Profile   Renal Lab Results  Component Value Date   BUN 30 (H) 12/21/2020   CREATININE 1.06 (H) 12/21/2020   LABCREA 137 12/21/2020   BCR 28 (H) 12/21/2020   GFR 51.87 (L) 03/22/2020   GFRAA 65 12/21/2020   GFRNONAA 56 (L) 12/21/2020    Hepatic Lab Results  Component Value Date   AST 22 12/21/2020   ALT 18 12/21/2020   ALBUMIN 4.0 04/01/2020   ALKPHOS 82 04/01/2020   HCVAB NEGATIVE 05/18/2016   LIPASE 11 10/04/2013    Electrolytes Lab Results  Component Value Date   NA 139  12/21/2020   K 4.9 12/21/2020   CL 101 12/21/2020   CALCIUM 9.2 12/21/2020   MG 2.1 06/30/2013    Bone Lab Results  Component Value Date   VD25OH 34.99 03/12/2019    Inflammation (CRP: Acute Phase) (ESR: Chronic Phase) Lab Results  Component Value Date   CRP 1 05/06/2018   ESRSEDRATE 52 (H) 04/02/2020   LATICACIDVEN 0.9 04/01/2020         Note: Above Lab results reviewed.  Imaging  MR LUMBAR SPINE WO CONTRAST CLINICAL DATA:  Right buttock and leg pain.  EXAM: MRI LUMBAR SPINE WITHOUT CONTRAST  TECHNIQUE: Multiplanar, multisequence MR imaging of the lumbar spine was performed. No intravenous contrast was administered.  COMPARISON:  MRI lumbar spine 11/19/2020  FINDINGS: Segmentation:  Standard  Alignment: Mild retrolisthesis L1-2 and L2-3. Mild dextroscoliosis.  Vertebrae:  Normal bone marrow.  Negative for fracture or mass.  Conus medullaris and cauda equina: Conus extends to the L1-2 level. Conus and cauda equina appear normal.  Paraspinal and other soft tissues: Negative for paraspinous mass, adenopathy, or fluid collection  Disc levels:  L1-2: Disc degeneration and spurring asymmetric on the left. This is causing moderate subarticular and foraminal stenosis on the left. No interval change. Mild narrowing of the spinal canal.  L2-3: Disc degeneration and spurring asymmetric to the left. Bilateral facet hypertrophy left greater than right. Moderate central canal stenosis. Moderate subarticular stenosis on the left due to spurring. No interval change.  L3-4: Disc degeneration with disc bulging and endplate spurring. Advanced facet hypertrophy bilaterally. Severe spinal stenosis unchanged. Severe subarticular stenosis bilaterally also unchanged.  L4-5: Asymmetric disc degeneration and spurring on the right. Moderate facet hypertrophy bilaterally. Moderate spinal stenosis. Severe subarticular stenosis on the right due to spurring. Moderate right  foraminal stenosis due to spurring. No interval change.  L5-S1: Postop decompression. Mild disc degeneration and spurring. Mild subarticular stenosis bilaterally. No significant neural impingement.  IMPRESSION: Multilevel lumbar degenerative change causing spinal and foraminal stenosis as above.  Overall no change from the study earlier this year.  Electronically Signed   By: Franchot Gallo M.D.   On: 04/02/2021 09:22  Assessment  The primary encounter diagnosis was Chronic hip pain (Bilateral). Diagnoses of Osteoarthritis of hips (Bilateral), Chronic low back pain (Bilateral) (L>R), Lumbar facet syndrome, Lumbar facet hypertrophy (Multilevel), Failed back surgical syndrome, Epidural fibrosis, DDD (degenerative disc disease), lumbosacral, Presence of intrathecal pump (Medtronic intrathecal programmable pump) (40 mL pump), and Abnormal MRI, lumbar spine (11/18/2019) were also pertinent to this visit.  Plan of Care  Problem-specific:  No problem-specific Assessment & Plan notes found for this encounter.  Ms. Remmi Armenteros has a current medication list which includes the following long-term medication(s): bupropion, ezetimibe, nitroglycerin, oxycodone-acetaminophen, [START ON 04/29/2021] oxycodone-acetaminophen, paroxetine, pregabalin, rosuvastatin, tizanidine, and oxycodone-acetaminophen.  Pharmacotherapy (Medications Ordered): No orders of the defined types were placed in this encounter.  Orders:  No orders of the defined types were placed in this encounter.  Follow-up plan:   Return for scheduled encounter, Pump Refill.     Interventional Therapies  Risk  Complexity Considerations:   Estimated body mass index is 33.45 kg/m as calculated from the following:   Height as of this encounter: $RemoveBeforeD'5\' 8"'[id number]$  (1.727 m).   Weight as of this encounter: 220 lb (99.8 kg). WNL   Planned  Pending:   Diagnostic bilateral lumbar facet block  Possible bilateral therapeutic lumbar facet RFA #1     Under consideration:   Possible left lumbar facet RFA  Possible right lumbar facet RFA  Diagnostic caudal ESI + diagnostic epidurogram  Possible Racz procedure  Diagnostic bilateral IA knee injection (Steroid) Diagnostic bilateral genicular NB  Possible bilateral genicular nerve RFA  Possible bilateral Hyalgan knee injection  Diagnostic cervical ESI  Diagnostic bilateral cervical facet block  Possible bilateral cervical facet RFA    Completed:   Diagnostic/therapeutic left L2-3 LESI x1  Diagnostic/therapeutic right lumbar facet block x3 (06/23/2019) (100/100/75/>75)  Diagnostic/therapeutic left lumbar facet block x1 (06/23/2019) (100/100/75/>75)    Therapeutic  Palliative (PRN)  options:   Palliative/therapeutic intrathecal pump management (refills/programming adjustments)  Palliative left L2-3 LESI #2  Palliative right lumbar facet block #4  Diagnostic left lumbar facet block #2     Recent Visits Date Type Provider Dept  03/16/21 Procedure visit [name], MD [facility]  02/21/21 Procedure visit [name], MD [facility]  Showing recent visits within past 90 days and meeting all other requirements Today's Visits Date Type Provider Dept  04/04/21 Office Visit [name], MD [facility]  Showing today's visits and meeting all other requirements Future Appointments Date Type Provider Dept  04/20/21 Appointment [name], MD [facility]  Showing future appointments within next 90 days and meeting all other requirements I discussed the assessment and treatment plan with the patient. The patient was provided an opportunity to ask questions and all were answered. The patient agreed with the plan and demonstrated an understanding of the instructions.  Patient advised to call back or seek an in-person evaluation if the symptoms or condition worsens.  Duration of encounter: 60 minutes.  Note by:  Gaspar Cola, MD Date: 04/04/2021; Time: 5:36 PM

## 2021-04-04 ENCOUNTER — Ambulatory Visit: Payer: Medicare Other | Attending: Pain Medicine | Admitting: Pain Medicine

## 2021-04-04 ENCOUNTER — Other Ambulatory Visit: Payer: Self-pay

## 2021-04-04 DIAGNOSIS — M160 Bilateral primary osteoarthritis of hip: Secondary | ICD-10-CM | POA: Diagnosis not present

## 2021-04-04 DIAGNOSIS — M25551 Pain in right hip: Secondary | ICD-10-CM | POA: Diagnosis not present

## 2021-04-04 DIAGNOSIS — G8929 Other chronic pain: Secondary | ICD-10-CM

## 2021-04-04 DIAGNOSIS — M5137 Other intervertebral disc degeneration, lumbosacral region: Secondary | ICD-10-CM

## 2021-04-04 DIAGNOSIS — M5441 Lumbago with sciatica, right side: Secondary | ICD-10-CM | POA: Diagnosis not present

## 2021-04-04 DIAGNOSIS — R937 Abnormal findings on diagnostic imaging of other parts of musculoskeletal system: Secondary | ICD-10-CM

## 2021-04-04 DIAGNOSIS — M47816 Spondylosis without myelopathy or radiculopathy, lumbar region: Secondary | ICD-10-CM

## 2021-04-04 DIAGNOSIS — G96198 Other disorders of meninges, not elsewhere classified: Secondary | ICD-10-CM

## 2021-04-04 DIAGNOSIS — Z978 Presence of other specified devices: Secondary | ICD-10-CM

## 2021-04-04 DIAGNOSIS — M25552 Pain in left hip: Secondary | ICD-10-CM | POA: Diagnosis not present

## 2021-04-04 DIAGNOSIS — M961 Postlaminectomy syndrome, not elsewhere classified: Secondary | ICD-10-CM

## 2021-04-06 ENCOUNTER — Other Ambulatory Visit: Payer: Self-pay | Admitting: Pain Medicine

## 2021-04-06 ENCOUNTER — Telehealth: Payer: Self-pay | Admitting: Pain Medicine

## 2021-04-06 DIAGNOSIS — M47816 Spondylosis without myelopathy or radiculopathy, lumbar region: Secondary | ICD-10-CM

## 2021-04-06 DIAGNOSIS — M47817 Spondylosis without myelopathy or radiculopathy, lumbosacral region: Secondary | ICD-10-CM

## 2021-04-06 DIAGNOSIS — G8929 Other chronic pain: Secondary | ICD-10-CM

## 2021-04-06 NOTE — Telephone Encounter (Signed)
Sakai was talking to Dr. Dossie Arbour last visit about having facet blocks done. She wants to go ahead with that. Please ask Dr. Dossie Arbour to put in an order for facets. I will also send to [employer] to start approval from insurance She wants to have done on Thurs 04-27-21. Please call patient and let her know status. Thank you

## 2021-04-06 NOTE — Progress Notes (Unsigned)
The patient called today indicating that she thought about the options and has decided to have the bilateral lumbar facet blocks.  We will go ahead and enter the order for that today.

## 2021-04-06 NOTE — Telephone Encounter (Signed)
Spoke with Dr. Dossie Arbour, he will order. Patient notified.

## 2021-04-07 NOTE — Telephone Encounter (Signed)
Melinda [name] notified and will request PA

## 2021-04-10 ENCOUNTER — Other Ambulatory Visit: Payer: Self-pay | Admitting: Orthopedic Surgery

## 2021-04-10 ENCOUNTER — Telehealth: Payer: Self-pay | Admitting: Pain Medicine

## 2021-04-10 DIAGNOSIS — M4696 Unspecified inflammatory spondylopathy, lumbar region: Secondary | ICD-10-CM | POA: Diagnosis not present

## 2021-04-10 DIAGNOSIS — M5416 Radiculopathy, lumbar region: Secondary | ICD-10-CM | POA: Diagnosis not present

## 2021-04-10 NOTE — Telephone Encounter (Signed)
[name] ,states that she went to [facility]. The other day and it was recommended that she has an SI injection. They were going to refer her to another MD, but she wants you to do the injections. This is a step to see if she would need back surgery, which she does not.   MRI was done 03/2021 [name], [name] of Dr [name] is the one that states she needs SI procedure.  Melinda [name] is at the questioning what she should do. Please give suggestions.

## 2021-04-12 ENCOUNTER — Telehealth: Payer: Self-pay

## 2021-04-12 ENCOUNTER — Other Ambulatory Visit: Payer: Self-pay | Admitting: Pain Medicine

## 2021-04-12 DIAGNOSIS — M545 Low back pain, unspecified: Secondary | ICD-10-CM

## 2021-04-12 DIAGNOSIS — G8929 Other chronic pain: Secondary | ICD-10-CM | POA: Insufficient documentation

## 2021-04-12 DIAGNOSIS — M533 Sacrococcygeal disorders, not elsewhere classified: Secondary | ICD-10-CM

## 2021-04-12 NOTE — Telephone Encounter (Signed)
Called and talked with [facility] . Dr Lowella Dandy willing to do SiJoint injections. Patient states she would like sedation for this procedure.

## 2021-04-16 DIAGNOSIS — G4733 Obstructive sleep apnea (adult) (pediatric): Secondary | ICD-10-CM | POA: Diagnosis not present

## 2021-04-20 ENCOUNTER — Other Ambulatory Visit: Payer: Self-pay

## 2021-04-20 ENCOUNTER — Ambulatory Visit
Admission: RE | Admit: 2021-04-20 | Discharge: 2021-04-20 | Disposition: A | Discharge status: Home / Self Care | Payer: Medicare Other | Source: Ambulatory Visit | Attending: Pain Medicine | Admitting: Pain Medicine

## 2021-04-20 ENCOUNTER — Ambulatory Visit (HOSPITAL_BASED_OUTPATIENT_CLINIC_OR_DEPARTMENT_OTHER): Payer: Medicare Other | Admitting: Pain Medicine

## 2021-04-20 VITALS — BP 106/69 | HR 80 | Temp 97.2°F | Resp 16 | Ht 70.0 in | Wt 217.0 lb

## 2021-04-20 DIAGNOSIS — M25552 Pain in left hip: Secondary | ICD-10-CM | POA: Insufficient documentation

## 2021-04-20 DIAGNOSIS — M47817 Spondylosis without myelopathy or radiculopathy, lumbosacral region: Secondary | ICD-10-CM

## 2021-04-20 DIAGNOSIS — M961 Postlaminectomy syndrome, not elsewhere classified: Secondary | ICD-10-CM

## 2021-04-20 DIAGNOSIS — G894 Chronic pain syndrome: Secondary | ICD-10-CM | POA: Insufficient documentation

## 2021-04-20 DIAGNOSIS — M533 Sacrococcygeal disorders, not elsewhere classified: Secondary | ICD-10-CM | POA: Diagnosis not present

## 2021-04-20 DIAGNOSIS — G96198 Other disorders of meninges, not elsewhere classified: Secondary | ICD-10-CM | POA: Diagnosis not present

## 2021-04-20 DIAGNOSIS — Z451 Encounter for adjustment and management of infusion pump: Secondary | ICD-10-CM

## 2021-04-20 DIAGNOSIS — M47816 Spondylosis without myelopathy or radiculopathy, lumbar region: Secondary | ICD-10-CM | POA: Diagnosis not present

## 2021-04-20 DIAGNOSIS — M160 Bilateral primary osteoarthritis of hip: Secondary | ICD-10-CM | POA: Insufficient documentation

## 2021-04-20 DIAGNOSIS — Z978 Presence of other specified devices: Secondary | ICD-10-CM | POA: Insufficient documentation

## 2021-04-20 DIAGNOSIS — Z79899 Other long term (current) drug therapy: Secondary | ICD-10-CM | POA: Diagnosis not present

## 2021-04-20 DIAGNOSIS — M5441 Lumbago with sciatica, right side: Secondary | ICD-10-CM | POA: Diagnosis not present

## 2021-04-20 DIAGNOSIS — M542 Cervicalgia: Secondary | ICD-10-CM | POA: Insufficient documentation

## 2021-04-20 DIAGNOSIS — M545 Low back pain, unspecified: Secondary | ICD-10-CM

## 2021-04-20 DIAGNOSIS — Z5189 Encounter for other specified aftercare: Secondary | ICD-10-CM | POA: Insufficient documentation

## 2021-04-20 DIAGNOSIS — M25551 Pain in right hip: Secondary | ICD-10-CM | POA: Insufficient documentation

## 2021-04-20 DIAGNOSIS — G8929 Other chronic pain: Secondary | ICD-10-CM

## 2021-04-20 DIAGNOSIS — M5137 Other intervertebral disc degeneration, lumbosacral region: Secondary | ICD-10-CM

## 2021-04-20 MED ORDER — OXYCODONE-ACETAMINOPHEN 5-325 MG PO TABS: 1.0000 | ORAL_TABLET | Freq: Three times a day (TID) | ORAL | 0 refills | Status: DC | PRN

## 2021-04-20 NOTE — Patient Instructions (Signed)

## 2021-04-20 NOTE — Progress Notes (Signed)
PROVIDER NOTE: Information contained herein reflects review and annotations entered in association with encounter. Interpretation of such information and data should be left to medically-trained personnel. Information provided to patient can be located elsewhere in the medical record under "Patient Instructions". Document created using STT-dictation technology, any transcriptional errors that may result from process are unintentional.    Patient: Melinda [name]  Service Category: Procedure  Provider: Gaspar Cola, MD  DOB: [birth year]  DOS: 04/20/2021  Location: [facility]  MRN: [id number]  Setting: Ambulatory - outpatient  Referring Provider: Jearld Fenton, NP  Type: Established Patient  Specialty: Interventional Pain Management  PCP: Jearld Fenton, NP   Primary Reason for Visit: Interventional Pain Management Treatment. CC: Back Pain (Left, lower)  Procedure:          Type: Management of Intrathecal Drug Delivery System (IDDS) - Reservoir Refill [phone]). No rate change.    Indications: 1. Chronic low back pain (Bilateral) w/o sciatica   2. Chronic sacroiliac joint pain (Bilateral)   3. Lumbar facet syndrome   4. Spondylosis without myelopathy or radiculopathy, lumbosacral region   5. Chronic hip pain (Bilateral)   6. Osteoarthritis of hips (Bilateral)   7. Chronic low back pain (Bilateral) (L>R)   8. Lumbar facet hypertrophy (Multilevel)   9. Failed back surgical syndrome   10. Epidural fibrosis   11. DDD (degenerative disc disease), lumbosacral   12. Presence of intrathecal pump (Medtronic intrathecal programmable pump) (40 mL pump)   13. Encounter for adjustment or management of infusion pump   14. Encounter for therapeutic procedure    Pain Assessment: Self-Reported Pain Score: 2 /10             Reported level is compatible with observation.        Note: Pump found to be flipped and he required fluoroscopic guidance for manipulation back into the  correct position and access.    Intrathecal Drug Delivery System (IDDS)  Pump Device:  Manufacturer: Medtronic Model: Synchromed II Model No.: [id number] Serial No.: [id number] H Delivery Route: Intrathecal Type: Programmable  Volume (mL): 40 mL reservoir Priming Volume: n/a  Calibration Constant: 113.0  MRI compatibility: Conditional   Implant Details:  Date: 03/11/2020  Implanter: [name], MD  Contact Information: [place], [place] Last Revision/Replacement: 03/11/2020 Estimated Replacement Date: Jul 2026  Implant Site: Abdominal Laterality: Right  Catheter: Manufacturer: Medtronic Model: Ascenda Model No.: 5170  Serial No.: [id number]  Implanted Length (cm): 102.1  Catheter Volume (mL): 0.225  Tip Location (Level): T6-7 Canal Access Site: T12-L1  Drug content:  Primary Medication Class: Opioid  Medication: PF-Fentanyl  Concentration: 500 mcg/mL  Secondary Medication Class: Local Anesthetic  Medication: PF-Bupivacaine  Concentration: 10 mg/mL  Tertiary Medication Class: Antispasmodic  Medication: PF-Baclofen  Concentration: 120 mcg/mL  Fourth Medication Class: none   PA parameters (PCA-mode):  Mode: Off (Inactive)  Programming:  Type: Simple continuous.  Medication, Concentration, Infusion Program, & Delivery Rate: For up-to-date details please see most recent scanned programming printout.   Changes:  Medication Change: None at this point Rate Change: No change in rate  Reported side-effects or adverse reactions: None reported  Effectiveness: Described as relatively effective, allowing for increase in activities of daily living (ADL) Clinically meaningful improvement in function (CMIF): Sustained CMIF goals met  Plan: Pump refill today   Pharmacotherapy Assessment   Analgesic: Oxycodone 5 mg, 1 tab PO q 8 hrs (15 mg/day of oxycodone) + Intrathecal PF-Fentanyl MME/day: 22.5 mg/day (oral).  Monitoring: [place] PMP: PDMP not  reviewed this encounter.       Pharmacotherapy: No side-effects or adverse reactions reported. Compliance: No problems identified. Effectiveness: Clinically acceptable. Plan: Refer to "POC". UDS:  Summary  Date Value Ref Range Status  07/24/2018 FINAL  Final    Comment:    ==================================================================== TOXASSURE SELECT 13 (MW) ==================================================================== Test                             Result       Flag       Units Drug Present and Declared for Prescription Verification   Oxycodone                      754          EXPECTED   ng/mg creat   Oxymorphone                    231          EXPECTED   ng/mg creat   Noroxycodone                   2463         EXPECTED   ng/mg creat   Noroxymorphone                 89           EXPECTED   ng/mg creat    Sources of oxycodone are scheduled prescription medications.    Oxymorphone, noroxycodone, and noroxymorphone are expected    metabolites of oxycodone. Oxymorphone is also available as a    scheduled prescription medication. Drug Present not Declared for Prescription Verification   Fentanyl                       4            UNEXPECTED ng/mg creat   Norfentanyl                    29           UNEXPECTED ng/mg creat    Source of fentanyl is a scheduled prescription medication,    including IV, patch, and transmucosal formulations. Norfentanyl    is an expected metabolite of fentanyl. ==================================================================== Test                      Result    Flag   Units      Ref Range   Creatinine              134              mg/dL      >=20 ==================================================================== Declared Medications:  The flagging and interpretation on this report are based on the  following declared medications.  Unexpected results may arise from  inaccuracies in the declared medications.  **Note: The testing scope of  this panel includes these medications:  Oxycodone  **Note: The testing scope of this panel does not include following  reported medications:  Aspirin (Aspirin 81)  Bupropion (Wellbutrin)  Chondroitin (Glucosamine-Chondroitin)  Cinnamon  Glipizide (Glucotrol)  Glucosamine (Glucosamine-Chondroitin)  Magnesium  Multivitamin  Naloxone  Nitroglycerin (Nitrostat)  Paroxetine  Polyethylene Glycol (GlycoLAX)  Polyethylene Glycol (MiraLAX)  Pregabalin (Lyrica)  Rosuvastatin (Crestor)  Supplement (Omega-3)  Tizanidine (Zanaflex)  Turmeric  Ubiquinone (Coenzyme Q 10)  Vitamin  D2 (Drisdol)  Vitamin E ==================================================================== For clinical consultation, please call [phone]. ====================================================================      Pre-op H&P Assessment:  Ms. Amadon is a 65 y.o. (year old), female patient, seen today for interventional treatment. 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 Ms. Madrazo has a current medication list which includes the following  prescription(s): accu-chek aviva plus, accu-chek guide, accu-chek softclix lancets, AMBULATORY NON FORMULARY MEDICATION, vitamin c, aspirin, b complex vitamins, biotin, accu-chek guide control, bupropion, calcium-magnesium-zinc, vitamin d, cinnamon, ezetimibe, glucosamine-chondroitin, insulin pen needle, magnesium, nitroglycerin, NONFORMULARY OR COMPOUNDED ITEM, fish oil, [START ON 04/29/2021] oxycodone-acetaminophen, ozempic (0.25 or 0.5 mg/dose), paroxetine, polyethylene glycol, pregabalin, rosuvastatin, tizanidine, vitamin e, [START ON 05/29/2021] oxycodone-acetaminophen, and [START ON 06/28/2021] oxycodone-acetaminophen. Her primarily concern today is the Back Pain (Left, lower)  Initial Vital Signs:  Pulse/HCG Rate: 80  Temp: (!) 97.2 F (36.2 C) Resp: 16 BP: 106/69 SpO2: 97 %  BMI: Estimated body mass index is 31.14 kg/m as calculated from the following:   Height as of this encounter: _0  (1.778 m).   Weight as of this encounter: 217 lb (98.4 kg).  Risk Assessment: Allergies: Reviewed. She has No Known Allergies.  Allergy Precautions: None required Coagulopathies: Reviewed. None identified.  Blood-thinner therapy: None at this time Active Infection(s): Reviewed. None identified. Ms. Tworek is afebrile  Site Confirmation: Ms. Muscato was asked to confirm the procedure and laterality before marking the site Procedure checklist: Completed Consent: Before the procedure and under the influence of no sedative(s), amnesic(s), or anxiolytics, the patient was informed of the treatment options, risks and possible complications. To fulfill our ethical and legal obligations, as recommended by the American Medical Association's Code of Ethics, I have informed the patient of my clinical impression; the nature and purpose of the treatment or procedure; the risks, benefits, and possible complications of the intervention; the alternatives, including doing nothing; the risk(s) and benefit(s) of the  alternative treatment(s) or procedure(s); and the risk(s) and benefit(s) of doing nothing.  Ms. Trettin was provided with information about the general risks and possible complications associated with most interventional procedures. These include, but are not limited to: failure to achieve desired goals, infection, bleeding, organ or nerve damage, allergic reactions, paralysis, and/or death.  In addition, she was informed of those risks and possible complications associated to this particular procedure, which include, but are not limited to: damage to the implant; failure to decrease pain; local, systemic, or serious CNS infections, intraspinal abscess with possible cord compression and paralysis, or life-threatening such as meningitis; bleeding; organ damage; nerve injury or damage with subsequent sensory, motor, and/or autonomic system dysfunction, resulting in transient or permanent pain, numbness, and/or weakness of one or several areas of the body; allergic reactions, either minor or major life-threatening, such as anaphylactic or anaphylactoid reactions.  Furthermore, Ms. Larimer was informed of those risks and complications associated with the medications. These  include, but are not limited to: allergic reactions (i.e.: anaphylactic or anaphylactoid reactions); endorphine suppression; bradycardia and/or hypotension; water retention and/or peripheral vascular relaxation leading to lower extremity edema and possible stasis ulcers; respiratory depression and/or shortness of breath; decreased metabolic rate leading to weight gain; swelling or edema; medication-induced neural toxicity; particulate matter embolism and blood vessel occlusion with resultant organ, and/or nervous system infarction; and/or intrathecal granuloma formation with possible spinal cord compression and permanent paralysis.  Before refilling the pump Ms. Castille was informed that some of the medications used in the devise may not be  FDA approved for such use and therefore it constitutes an off-label use of the medications.  Finally, she was informed that Medicine is not an exact science; therefore, there is also the possibility of unforeseen or unpredictable risks and/or possible complications that may result in a catastrophic outcome. The patient indicated having understood very clearly. We have given the patient no guarantees and we have made no promises. Enough time was given to the patient to ask questions, all of which were answered to the patient's satisfaction. Ms. Wilmarth has indicated that she wanted to continue with the procedure. Attestation: I, the ordering provider, attest that I have discussed with the patient the benefits, risks, side-effects, alternatives, likelihood of achieving goals, and potential problems during recovery for the procedure that I have provided informed consent. Date  Time: 04/20/2021 10:53 AM  Pre-Procedure Preparation:  Monitoring: As per clinic protocol. Respiration, ETCO2, SpO2, BP, heart rate and rhythm monitor placed and checked for adequate function Safety Precautions: Patient was assessed for positional comfort and pressure points before starting the procedure. Time-out: I initiated and conducted the "Time-out" before starting the procedure, as per protocol. The patient was asked to participate by confirming the accuracy of the "Time Out" information. Verification of the correct person, site, and procedure were performed and confirmed by me, the nursing staff, and the patient. "Time-out" conducted as per Joint Commission's Universal Protocol (UP.01.01.01). Time: 1110  Description of Procedure:          Position: Supine Target Area: Central-port of intrathecal pump. Approach: Anterior, 90 degree angle approach. Area Prepped: Entire Area around the pump implant. DuraPrep (Iodine Povacrylex [0.7% available iodine] and Isopropyl Alcohol, 74% w/w) Safety Precautions: Aspiration looking  for blood return was conducted prior to all injections. At no point did we inject any substances, as a needle was being advanced. No attempts were made at seeking any paresthesias. Safe injection practices and needle disposal techniques used. Medications properly checked for expiration dates. SDV (single dose vial) medications used. Description of the Procedure: Protocol guidelines were followed. Two nurses trained to do implant refills were present during the entire procedure. The refill medication was checked by both healthcare providers as well as the patient. The patient was included in the "Time-out" to verify the medication. The patient was placed in position. The pump was identified. The area was prepped in the usual manner. The sterile template was positioned over the pump, making sure the side-port location matched that of the pump. Both, the pump and the template were held for stability. The needle provided in the Medtronic Kit was then introduced thru the center of the template and into the central port. The pump content was aspirated and discarded volume documented. The new medication was slowly infused into the pump, thru the filter, making sure to avoid overpressure of the device. The needle was then removed and the area cleansed, making sure to leave some of the  prepping solution back to take advantage of its long term bactericidal properties. The pump was interrogated and programmed to reflect the correct medication, volume, and dosage. The program was printed and taken to the physician for approval. Once checked and signed by the physician, a copy was provided to the patient and another scanned into the EMR.  Vitals:   04/20/21 1053  BP: 106/69  Pulse: 80  Resp: 16  Temp: (!) 97.2 F (36.2 C)  TempSrc: Temporal  SpO2: 97%  Weight: 217 lb (98.4 kg)  Height: _0  (1.778 m)    Start Time: 1112 hrs. End Time: 1142 hrs. Materials & Medications: Medtronic Refill Kit Medication(s):  Please see chart orders for details.  Imaging Guidance:          Type of Imaging Technique: Fluoroscopy Guidance (Non-spinal) Indication(s): Assistance in needle guidance and placement for procedures requiring needle placement in or near specific anatomical locations not easily accessible without such assistance. Exposure Time: Please see nurses notes. Contrast: None used. Fluoroscopic Guidance: I was personally present during the use of fluoroscopy. "Tunnel Vision Technique" used to obtain the best possible view of the target area. Parallax error corrected before commencing the procedure. "Direction-depth-direction" technique used to introduce the needle under continuous pulsed fluoroscopy. Once target was reached, antero-posterior, oblique, and lateral fluoroscopic projection used confirm needle placement in all planes. Images permanently stored in EMR. Ultrasound Guidance: N/A Interpretation: I personally interpreted the imaging intraoperatively. Adequate needle placement confirmed in multiple planes. Appropriate spread of contrast into desired area was observed. No evidence of afferent or efferent intravascular uptake. No intrathecal or subarachnoid spread observed. Permanent images saved into the patient's record.  Antibiotic Prophylaxis:   Anti-infectives (From admission, onward)    None      Indication(s): None identified  Post-operative Assessment:  Post-procedure Vital Signs:  Pulse/HCG Rate: 80  Temp: (!) 97.2 F (36.2 C) Resp: 16 BP: 106/69 SpO2: 97 %  EBL: None  Complications: No immediate post-treatment complications observed by team, or reported by patient.  Note: The patient tolerated the entire procedure well. A repeat set of vitals were taken after the procedure and the patient was kept under observation following institutional policy, for this type of procedure. Post-procedural neurological assessment was performed, showing return to baseline, prior to discharge.  The patient was provided with post-procedure discharge instructions, including a section on how to identify potential problems. Should any problems arise concerning this procedure, the patient was given instructions to immediately contact us, at any time, without hesitation. In any case, we plan to contact the patient by telephone for a follow-up status report regarding this interventional procedure.  Comments:  No additional relevant information.  Plan of Care  Orders:  Orders Placed This Encounter  Procedures   PUMP REFILL    Maintain Protocol by having two(2) healthcare providers during procedure and programming.    Scheduling Instructions:     Please refill intrathecal pump today.    Order Specific Question:   Where will this procedure be performed?    Answer:   [facility]   PUMP REFILL    Whenever possible schedule on a procedure today.    Standing Status:   Future    Standing Expiration Date:   08/21/2021    Scheduling Instructions:     Please schedule intrathecal pump refill based on pump programming. Avoid schedule intervals of more than 120 days (4 months).    Order Specific Question:   Where will this procedure be  performed?    Answer:   [facility]   DG PAIN CLINIC C-ARM 1-60 MIN NO REPORT    Intraoperative interpretation by procedural physician at [facility].    Standing Status:   Standing    Number of Occurrences:   1    Order Specific Question:   Reason for exam:    Answer:   Assistance in needle guidance and placement for procedures requiring needle placement in or near specific anatomical locations not easily accessible without such assistance.   Informed Consent Details: Physician/Practitioner Attestation; Transcribe to consent form and obtain patient signature    Transcribe to consent form and obtain patient signature.    Order Specific Question:   Physician/Practitioner attestation of informed consent for procedure/surgical case     Answer:   I, the physician/practitioner, attest that I have discussed with the patient the benefits, risks, side effects, alternatives, likelihood of achieving goals and potential problems during recovery for the procedure that I have provided informed consent.    Order Specific Question:   Procedure    Answer:   Intrathecal pump refill    Order Specific Question:   Physician/Practitioner performing the procedure    Answer:   Attending Physician: Kathlen Brunswick. Dossie Arbour, MD & designated trained staff    Order Specific Question:   Indication/Reason    Answer:   Chronic Pain Syndrome (G89.4), presence of an intrathecal pump (Z97.8)    Chronic Opioid Analgesic:  Oxycodone 5 mg, 1 tab PO q 8 hrs (15 mg/day of oxycodone) + Intrathecal PF-Fentanyl MME/day: 22.5 mg/day (oral).   Medications ordered for procedure: Meds ordered this encounter  Medications   oxyCODONE-acetaminophen (PERCOCET) 5-325 MG tablet    Sig: Take 1 tablet by mouth every 8 (eight) hours as needed for severe pain. Each refill must last 30 days.    Dispense:  90 tablet    Refill:  0    DO NOT: delete (not duplicate); no partial-fill (will deny script to complete), no refill request (F/U required). DISPENSE: 1 day early if closed on fill date. WARN: No CNS-depressants within 8 hrs of med.   oxyCODONE-acetaminophen (PERCOCET) 5-325 MG tablet    Sig: Take 1 tablet by mouth every 8 (eight) hours as needed for severe pain. Each refill must last 30 days.    Dispense:  90 tablet    Refill:  0    DO NOT: delete (not duplicate); no partial-fill (will deny script to complete), no refill request (F/U required). DISPENSE: 1 day early if closed on fill date. WARN: No CNS-depressants within 8 hrs of med.    Medications administered: Melinda [name] had no medications administered during this visit.  See the medical record for exact dosing, route, and time of administration.  Follow-up plan:   Return for Pump Refill (Max:35mo.        Interventional Therapies  Risk  Complexity Considerations:   Estimated body mass index is 33.45 kg/m as calculated from the following:   Height as of this encounter: _0  (1.727 m).   Weight as of this encounter: 220 lb (99.8 kg). WNL   Planned  Pending:   Diagnostic bilateral lumbar facet block  Possible bilateral therapeutic lumbar facet RFA #1    Under consideration:   Possible left lumbar facet RFA  Possible right lumbar facet RFA  Diagnostic caudal ESI + diagnostic epidurogram  Possible Racz procedure  Diagnostic bilateral IA knee injection (Steroid) Diagnostic bilateral genicular NB  Possible bilateral genicular nerve RFA  Possible bilateral Hyalgan knee injection  Diagnostic cervical ESI  Diagnostic bilateral cervical facet block  Possible bilateral cervical facet RFA    Completed:   Diagnostic/therapeutic left L2-3 LESI x1  Diagnostic/therapeutic right lumbar facet block x3 (06/23/2019) (100/100/75/>75)  Diagnostic/therapeutic left lumbar facet block x1 (06/23/2019) (100/100/75/>75)    Therapeutic  Palliative (PRN) options:   Palliative/therapeutic intrathecal pump management (refills/programming adjustments)  Palliative left L2-3 LESI #2  Palliative right lumbar facet block #4  Diagnostic left lumbar facet block #2      Recent Visits Date Type Provider Dept  04/04/21 Office Visit [name], MD [facility]  03/16/21 Procedure visit [name], MD [facility]  02/21/21 Procedure visit [name], MD [facility]  Showing recent visits within past 90 days and meeting all other requirements Today's Visits Date Type Provider Dept  04/20/21 Procedure visit [name], MD [facility]  Showing today's visits and meeting all other requirements Future Appointments Date Type Provider Dept  04/25/21 Appointment [name], MD [facility]  06/20/21 Appointment [name],  MD [facility]  Showing future appointments within next 90 days and meeting all other requirements Disposition: Discharge home  Discharge (Date  Time): 04/20/2021; 1150 hrs.   Primary Care Physician: Jearld Fenton, NP Location: [facility] Note by: Gaspar Cola, MD Date: 04/20/2021; Time: 12:26 PM  Disclaimer:  Medicine is not an Chief Strategy Officer. The only guarantee in medicine is that nothing is guaranteed. It is important to note that the decision to proceed with this intervention was based on the information collected from the patient. The Data and conclusions were drawn from the patient's questionnaire, the interview, and the physical examination. Because the information was provided in large part by the patient, it cannot be guaranteed that it has not been purposely or unconsciously manipulated. Every effort has been made to obtain as much relevant data as possible for this evaluation. It is important to note that the conclusions that lead to this procedure are derived in large part from the available data. Always take into account that the treatment will also be dependent on availability of resources and existing treatment guidelines, considered by other Pain Management Practitioners as being common knowledge and practice, at the time of the intervention. For Medico-Legal purposes, it is also important to point out that variation in procedural techniques and pharmacological choices are the acceptable norm. The indications, contraindications, technique, and results of the above procedure should only be interpreted and judged by a Board-Certified Interventional Pain Specialist with extensive familiarity and expertise in the same exact procedure and technique.

## 2021-04-20 NOTE — Progress Notes (Signed)
Nursing Pain Medication Assessment:  Safety precautions to be maintained throughout the outpatient stay will include: orient to surroundings, keep bed in low position, maintain call bell within reach at all times, provide assistance with transfer out of bed and ambulation.  Medication Inspection Compliance: Pill count conducted under aseptic conditions, in front of the patient. Neither the pills nor the bottle was removed from the patient's sight at any time. Once count was completed pills were immediately returned to the patient in their original bottle.  Medication: Oxycodone/APAP Pill/Patch Count:  9 0 Pill/Patch Appearance: Markings consistent with prescribed medication Bottle Appearance: Standard pharmacy container. Clearly labeled. Filled Date: 09 / 29 / 2022 Last Medication intake:  Today

## 2021-04-23 ENCOUNTER — Other Ambulatory Visit: Payer: Self-pay | Admitting: Internal Medicine

## 2021-04-23 DIAGNOSIS — G8929 Other chronic pain: Secondary | ICD-10-CM

## 2021-04-23 DIAGNOSIS — M7918 Myalgia, other site: Secondary | ICD-10-CM

## 2021-04-23 NOTE — Telephone Encounter (Signed)
Requested medication (s) are due for refill today: yes  Requested medication (s) are on the active medication list: yes  Last refill:  01/26/21 #270     Prescription ends 04/26/21  Future visit scheduled: yes  Notes to clinic:  med not delegated to NT to RF   Requested Prescriptions  Pending Prescriptions Disp Refills   tiZANidine (ZANAFLEX) 4 MG tablet [Pharmacy Med Name: TIZANIDINE HCL 4 MG TABLET] 270 tablet 0    Sig: TAKE 1 TABLET (4 MG TOTAL) BY MOUTH EVERY 8 (EIGHT) HOURS AS NEEDED FOR MUSCLE SPASMS     Not Delegated - Cardiovascular:  Alpha-2 Agonists - tizanidine Failed - 04/23/2021 12:52 AM      Failed - This refill cannot be delegated      Passed - Valid encounter within last 6 months    Recent Outpatient Visits           4 months ago Type 2 diabetes mellitus with diabetic mononeuropathy, without long-term current use of insulin ([place])   [facility] [place], Coralie Keens, NP       Future Appointments             In 2 months Baity, Coralie Keens, NP [facility], [place]   In 2 months Marlou Porch, Thana Farr, MD [place], [facility]

## 2021-04-25 ENCOUNTER — Other Ambulatory Visit: Payer: Self-pay | Admitting: Internal Medicine

## 2021-04-25 ENCOUNTER — Ambulatory Visit: Payer: Medicare Other | Admitting: Pain Medicine

## 2021-04-25 ENCOUNTER — Encounter: Payer: Medicare Other | Admitting: Pain Medicine

## 2021-04-25 DIAGNOSIS — Z1231 Encounter for screening mammogram for malignant neoplasm of breast: Secondary | ICD-10-CM

## 2021-04-25 NOTE — Telephone Encounter (Signed)
Requested Prescriptions  Pending Prescriptions Disp Refills  . PARoxetine (PAXIL) 20 MG tablet [Pharmacy Med Name: PAROXETINE HCL 20 MG TABLET] 45 tablet 0    Sig: TAKE 0.5 TABLET BY MOUTH DAILY IN THE MORNING     Psychiatry:  Antidepressants - SSRI Passed - 04/25/2021  8:16 AM      Passed - Completed PHQ-2 or PHQ-9 in the last 360 days      Passed - Valid encounter within last 6 months    Recent Outpatient Visits          4 months ago Type 2 diabetes mellitus with diabetic mononeuropathy, without long-term current use of insulin ([place])   [facility] [place], Coralie Keens, NP      Future Appointments            In 1 month Baity, Coralie Keens, NP [facility], [place]   In 2 months Marlou Porch, Thana Farr, MD [facility], [facility]

## 2021-04-28 ENCOUNTER — Ambulatory Visit
Admission: RE | Admit: 2021-04-28 | Discharge: 2021-04-28 | Disposition: A | Discharge status: Home / Self Care | Payer: Medicare Other | Source: Ambulatory Visit | Attending: Internal Medicine | Admitting: Internal Medicine

## 2021-04-28 ENCOUNTER — Other Ambulatory Visit: Payer: Self-pay

## 2021-04-28 DIAGNOSIS — Z1231 Encounter for screening mammogram for malignant neoplasm of breast: Secondary | ICD-10-CM | POA: Diagnosis not present

## 2021-05-01 NOTE — Progress Notes (Deleted)
rescheduled

## 2021-05-02 ENCOUNTER — Ambulatory Visit: Payer: Medicare Other | Admitting: Pain Medicine

## 2021-05-03 ENCOUNTER — Other Ambulatory Visit: Payer: Self-pay | Admitting: Internal Medicine

## 2021-05-03 NOTE — Telephone Encounter (Signed)
Requested Prescriptions  Pending Prescriptions Disp Refills  . OZEMPIC, 0.25 OR 0.5 MG/DOSE, 2 MG/1.5ML SOPN [Pharmacy Med Name: OZEMPIC 0.25-0.5 MG/DOSE PEN] 1.5 mL 1    Sig: INJECT 0.25 MG INTO THE SKIN ONCE A WEEK. FOR FIRST 4 WEEKS. THEN INCREASE DOSE TO 0.5MG  WEEKLY     Endocrinology:  Diabetes - GLP-1 Receptor Agonists Passed - 05/03/2021 12:13 PM      Passed - HBA1C is between 0 and 7.9 and within 180 days    Hgb A1c MFr Bld  Date Value Ref Range Status  12/21/2020 6.4 (H) <5.7 % of total Hgb Final    Comment:    For someone without known diabetes, a hemoglobin  A1c value between 5.7% and 6.4% is consistent with prediabetes and should be confirmed with a  follow-up test. . For someone with known diabetes, a value <7% indicates that their diabetes is well controlled. A1c targets should be individualized based on duration of diabetes, age, comorbid conditions, and other considerations. . This assay result is consistent with an increased risk of diabetes. . Currently, no consensus exists regarding use of hemoglobin A1c for diagnosis of diabetes for children. Melinda [name] - Valid encounter within last 6 months    Recent Outpatient Visits          4 months ago Type 2 diabetes mellitus with diabetic mononeuropathy, without long-term current use of insulin ([name])   [facility] [place], Coralie Keens, NP      Future Appointments            In 1 month Baity, Coralie Keens, NP [facility], [place]   In 2 months Marlou Porch, Thana Farr, MD [facility], [facility]

## 2021-05-06 MED FILL — Medication: INTRATHECAL | Qty: 1 | Status: AC

## 2021-05-24 ENCOUNTER — Other Ambulatory Visit: Payer: Self-pay

## 2021-05-24 ENCOUNTER — Other Ambulatory Visit: Payer: Self-pay | Admitting: Internal Medicine

## 2021-05-24 DIAGNOSIS — M797 Fibromyalgia: Secondary | ICD-10-CM

## 2021-05-24 MED ORDER — PAIN MANAGEMENT IT PUMP REFILL: 1.0000 | Freq: Once | INTRATHECAL | 0 refills | Status: AC

## 2021-05-24 NOTE — Telephone Encounter (Signed)
Requested medication (s) are due for refill today - yes  Requested medication (s) are on the active medication list -yes  Future visit scheduled -yes  Last refill: 02/06/21 #90 2 RF  Notes to clinic: Request RF: non delegated Rx  Requested Prescriptions  Pending Prescriptions Disp Refills   pregabalin (LYRICA) 150 MG capsule [Pharmacy Med Name: PREGABALIN 150 MG CAPSULE] 90 capsule 2    Sig: TAKE 1 CAPSULE BY MOUTH 3 TIMES DAILY.     Not Delegated - Neurology:  Anticonvulsants - Controlled Failed - 05/24/2021 11:03 AM      Failed - This refill cannot be delegated      Passed - Valid encounter within last 12 months    Recent Outpatient Visits           5 months ago Type 2 diabetes mellitus with diabetic mononeuropathy, without long-term current use of insulin ([place])   [facility], Coralie Keens, NP       Future Appointments             In 4 weeks Garnette Gunner, Coralie Keens, NP [place]   In 1 month Marlou Porch, Thana Farr, MD [place], [facility]               Requested Prescriptions  Pending Prescriptions Disp Refills   pregabalin (LYRICA) 150 MG capsule [Pharmacy Med Name: PREGABALIN 150 MG CAPSULE] 90 capsule 2    Sig: TAKE 1 CAPSULE BY MOUTH 3 TIMES DAILY.     Not Delegated - Neurology:  Anticonvulsants - Controlled Failed - 05/24/2021 11:03 AM      Failed - This refill cannot be delegated      Passed - Valid encounter within last 12 months    Recent Outpatient Visits           5 months ago Type 2 diabetes mellitus with diabetic mononeuropathy, without long-term current use of insulin ([place])   [facility], Coralie Keens, NP       Future Appointments             In 4 weeks Baity, Coralie Keens, NP [facility], [place]   In 1 month Marlou Porch, Thana Farr, MD [place], [facility]

## 2021-05-29 ENCOUNTER — Ambulatory Visit: Payer: Self-pay | Admitting: *Deleted

## 2021-05-29 NOTE — Telephone Encounter (Signed)
Pt calling in.   She is crying and moaning.  I started feeling nausea last night.  I had liquid stool.   I can't drive when I asked if she needed to go to the ED.    I'm miserable.   I'm on day 2 without my Lyrica.    No fever.   I'm so cold and I'm sweating bad.   My hair is wet.    When I took a shower I was freezing and now I'm hot.   I'm dry heaving.   I'm throwing up the congestion going down the back of my throat.   I have congestion.    My nose is running.  No sore throat.  I don't have an appetite.   I haven't eaten since Saturday evening.   I vomited last night and today.    I don't have anything on my stomach.   I'm checking my temperature it's 99.4.   It was 98 40 minutes ago.    I'm sweating so bad.    No urinary symptoms like burning, frequency.  "I don't know what's going on with me".   "My nose has been running".    When I vomit it's thick mucus.   It's clear what I'm vomiting.  I have thick clear mucus from my nose.  My wife works in [place] and would need to leave to take me to the work.   She would have to drive to [place] to take me.  "I'm trembling just laying here in the bed".   She questions having "DTs" from not taking her Lyrica for 2 days.    She's been off because she thought it was an auto refill.   Since it's a controlled substance it's not auto fill.    I don't know if it's been filled or not.    I've never been off the Lyrica.   I have fibromyalgia.   My flare ups are not usually like this.    Reason for Disposition  Patient sounds very sick or weak to the triager    C/o severe chills and sweating a lot.   Been of Lyrica for 2 days that she takes for the fibromyalgia.  Answer Assessment - Initial Assessment Questions 1. ONSET: "When did the muscle aches or body pains start?"      7:30 am this morning.   I took my last Lyrica 48 hrs ago 2. LOCATION: "What part of your body is hurting?" (e.g., entire body, arms, legs)      I'm having pain in my whole body and  really bad chills.   I'm sweating a lot.  I have nausea.  I've vomited some.   3. SEVERITY: "How bad is the pain?" (Scale 1-10; or mild, moderate, severe)   - MILD (1-3): doesn't interfere with normal activities    - MODERATE (4-7): interferes with normal activities or awakens from sleep    - SEVERE (8-10):  excruciating pain, unable to do any normal activities      3/10 of body pain.   I have fibromyalgia so I hurt I all the time. 4. CAUSE: "What do you think is causing the pains?"     Maybe being off the Lyrica 48 hrs but I don't know.   I'm freezing and sweating so bad. 5. FEVER: "Have you been having fever?"     No 6. OTHER SYMPTOMS: "Do you have any other symptoms?" (e.g., chest pain, weakness, rash, cold or flu symptoms,  weight loss)     Body aches, vomiting, diarrhea.  Poor appetite 7. PREGNANCY: "Is there any chance you are pregnant?" "When was your last menstrual period?"     N/A due to age 65. TRAVEL: "Have you traveled out of the country in the last month?" (e.g., travel history, exposures)     No  Protocols used: Muscle Aches and Body Pain-A-AH

## 2021-05-29 NOTE — Telephone Encounter (Signed)
Pt calling in c/o severe chills and sweating a lot.   Her hair is wet from where she is sweating so bad.   She is shaking and her voice is shaking from having chills so bad.    She has been off her Lyrica for 48 hrs and she is wondering if she is having withdrawals.   She is refusing to go to the ED or urgent care.   "I can't drive and I don't trust the urgent care and the ED is too long a wait".    Her wife works in [place] and would have to drive to [place] to pick her up and take her in.    She has not had any sick contacts.  See triage notes.  I have sent a high priority note to [place] to see if they can work her in.   She would call her wife to come take her to the office.

## 2021-05-30 NOTE — Telephone Encounter (Signed)
I contacted the patient and she informed me that she picked up a prescription on yesterday. She said she was feeling a little better since starting back on the lyrica.

## 2021-05-30 NOTE — Telephone Encounter (Signed)
Lyrica was refilled  yesterday. No appts until Thursday. Recommend UC or ED.

## 2021-06-01 DIAGNOSIS — G4733 Obstructive sleep apnea (adult) (pediatric): Secondary | ICD-10-CM | POA: Diagnosis not present

## 2021-06-02 ENCOUNTER — Encounter: Payer: Self-pay | Admitting: Internal Medicine

## 2021-06-02 ENCOUNTER — Ambulatory Visit (INDEPENDENT_AMBULATORY_CARE_PROVIDER_SITE_OTHER): Payer: Medicare Other | Admitting: Internal Medicine

## 2021-06-02 ENCOUNTER — Other Ambulatory Visit: Payer: Self-pay

## 2021-06-02 VITALS — BP 109/55 | HR 71 | Resp 15 | Ht 70.0 in | Wt 216.2 lb

## 2021-06-02 DIAGNOSIS — R112 Nausea with vomiting, unspecified: Secondary | ICD-10-CM | POA: Diagnosis not present

## 2021-06-02 DIAGNOSIS — J3489 Other specified disorders of nose and nasal sinuses: Secondary | ICD-10-CM | POA: Diagnosis not present

## 2021-06-02 NOTE — Progress Notes (Signed)
Subjective:    Patient ID: Melinda [name], female    DOB: 25-Sep-1955, 65 y.o.   MRN: [id number]  HPI  Patient presents to the clinic today with complaint of runny nose, nasal congestion, nausea and diarrhea. She reports this started 6 days ago. She reports her symptoms have completely resolved at this time. She did run out of her Pregabalin for 2 days, not sure if this is a contributing factor. She is back on it now.  She has not had sick contacts diagnosed with flu or COVID that she is aware of.  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   Bulging disc    CAD (coronary artery disease) 2009   s/p stent to LAD   Cardiac arrhythmia due to congenital heart disease 1992   WPW.  Ablations done.  Now has rare episodes   Chicken pox    Chronic fatigue    Chronic kidney disease    "problem with kidney filtration"   Coronary arteritis    Degenerative disc disease    Back, neck, hands, knees   Depression    Diabetes mellitus without complication (HCC)    Facet joint disease    Fibromyalgia    Heart disease    Hyperlipidemia    IBS (irritable bowel syndrome)    MCL deficiency, knee    MRSA (methicillin resistant staph aureus) culture positive 2011   GREAT TOE RIGHT FOOT   Neuropathy 04/18/2010   Orthostatic hypotension 01/2020   Peripheral neuropathy    Renal insufficiency    Restless leg syndrome    Sepsis ([place]) 09/2013   Sleep apnea 08/17/2003   uses CPAP, sleep study at [facility] (mild to moderate)   Spinal stenosis     Current Outpatient Medications  Medication Sig Dispense Refill   ACCU-CHEK AVIVA PLUS test strip USE TO CHECK BLOOD SUGAR ONE TIME A DAY 100 strip 5   ACCU-CHEK GUIDE test strip USE 1 EACH BY OTHER ROUTE 3 (THREE) TIMES  DAILY. USE AS INSTRUCTE 200 strip 1   Accu-Chek Softclix Lancets lancets USE TO CHECK BLOOD SUGAR 1 TIME DAILY 100 each 2   AMBULATORY NON FORMULARY MEDICATION Medication Name: CPAP MASK OF CHOICE FOR HOME DEVICE 1 each 0   Ascorbic Acid (VITAMIN C) 1000 MG tablet Take 1,000 mg by mouth daily.     aspirin 81 MG tablet Take 1 tablet (81 mg total) by mouth 2 (two) times daily after a meal. (Patient taking differently: Take 81 mg by mouth daily.) 30 tablet 0   b complex vitamins tablet Take 1 tablet by mouth in the morning.     Biotin 10000 MCG TABS Take 10,000 mcg by mouth daily.     Blood Glucose Calibration (ACCU-CHEK GUIDE CONTROL) LIQD 1 each by In Vitro route as needed. 1 each 1   buPROPion (WELLBUTRIN XL) 150 MG 24 hr tablet Take 1 tablet (150 mg total) by mouth daily. 90 tablet 1   CALCIUM-MAGNESIUM-ZINC PO Take 3 capsules by mouth daily.     Cholecalciferol (VITAMIN D) 50 MCG (2000 UT) tablet Take 2,000 Units by mouth daily.     CINNAMON PO Take 1,000 mg by mouth daily.  ezetimibe (ZETIA) 10 MG tablet Take 1 tablet (10 mg total) by mouth daily. 90 tablet 1   glucosamine-chondroitin 500-400 MG tablet Take 2 tablets by mouth daily.      Insulin Pen Needle 31G X 5 MM MISC BD Pen Needles- brand specific Inject insulin via insulin pen 6 x daily 100 each 2   Magnesium 400 MG CAPS Take 400 mg by mouth at bedtime.     nitroGLYCERIN (NITROSTAT) 0.4 MG SL tablet PLACE 1 TABLET UNDER THE TONGUE EVERY 5 (FIVE) MINUTES AS NEEDED FOR CHEST PAIN. 25 tablet 4   NONFORMULARY OR COMPOUNDED ITEM 212.8 mcg by Epidural Infusion route. Medtronic Neuromodulation pump  Fentanyl 1,000.0 mcg/ml Baclofen 240.0 mcg/ml Bupivacaine 20.0 mg/ml Total daily dose 269.6 mcg/day     Omega-3 Fatty Acids (FISH OIL) 1200 MG CAPS Take 1,200 mg by mouth in the morning.      oxyCODONE-acetaminophen (PERCOCET) 5-325 MG tablet Take 1 tablet by mouth every 8 (eight) hours as needed for severe pain. 90 tablet 0    oxyCODONE-acetaminophen (PERCOCET) 5-325 MG tablet Take 1 tablet by mouth every 8 (eight) hours as needed for severe pain. Each refill must last 30 days. 90 tablet 0   [START ON 06/28/2021] oxyCODONE-acetaminophen (PERCOCET) 5-325 MG tablet Take 1 tablet by mouth every 8 (eight) hours as needed for severe pain. Each refill must last 30 days. 90 tablet 0   OZEMPIC, 0.25 OR 0.5 MG/DOSE, 2 MG/1.5ML SOPN INJECT 0.25 MG INTO THE SKIN ONCE A WEEK. FOR FIRST 4 WEEKS. THEN INCREASE DOSE TO 0.5MG  WEEKLY 1.5 mL 1   PARoxetine (PAXIL) 20 MG tablet TAKE 0.5 TABLET BY MOUTH DAILY IN THE MORNING 45 tablet 0   polyethylene glycol (MIRALAX / GLYCOLAX) packet Take 17 g by mouth daily.     pregabalin (LYRICA) 150 MG capsule TAKE 1 CAPSULE BY MOUTH 3 TIMES DAILY. 270 capsule 0   rosuvastatin (CRESTOR) 20 MG tablet TAKE 1 TABLET BY MOUTH EVERY DAY (Patient taking differently: Take 20 mg by mouth daily.) 90 tablet 3   tiZANidine (ZANAFLEX) 4 MG tablet TAKE 1 TABLET (4 MG TOTAL) BY MOUTH EVERY 8 (EIGHT) HOURS AS NEEDED FOR MUSCLE SPASMS 270 tablet 0   vitamin E 180 MG (400 UNITS) capsule Take 400 Units by mouth daily.     No current facility-administered medications for this visit.    No Known Allergies  Family History  Problem Relation Age of Onset   Lung cancer Mother    Diabetes Father    Heart disease Maternal Grandfather    Diabetes Paternal Grandmother    Colon cancer Paternal Grandfather    Stroke Neg Hx     Social History   Socioeconomic History   Marital status: Significant Other    Spouse name: Not on file   Number of children: Not on file   Years of education: Not on file   Highest education level: Not on file  Occupational History   Not on file  Tobacco Use   Smoking status: Former    Packs/day: 1.50    Years: 32.00    Pack years: 48.00    Types: Cigarettes    Quit date: 07/22/2007    Years since quitting: 13.8   Smokeless tobacco: Never  Vaping Use   Vaping Use: Never used   Substance and Sexual Activity   Alcohol use: Yes    Comment: occ - Holidays   Drug use: Never    Comment: prescribed pain pump and oxy  Sexual activity: Yes  Other Topics Concern   Not on file  Social History Narrative   Lives at home with a partner. Independent at baseline   Social Determinants of Health   Financial Resource Strain: Not on file  Food Insecurity: Not on file  Transportation Needs: Not on file  Physical Activity: Not on file  Stress: Not on file  Social Connections: Not on file  Intimate Partner Violence: Not on file     Constitutional: Denies fever, malaise, fatigue, headache or abrupt weight changes.  HEENT: Denies eye pain, eye redness, ear pain, ringing in the ears, wax buildup, runny nose, nasal congestion, bloody nose, or sore throat. Respiratory: Denies difficulty breathing, shortness of breath, cough or sputum production.   Cardiovascular: Denies chest pain, chest tightness, palpitations or swelling in the hands or feet.  Gastrointestinal: Denies abdominal pain, bloating, constipation, diarrhea or blood in the stool.  Skin: Denies redness, rashes, lesions or ulcercations.    No other specific complaints in a complete review of systems (except as listed in HPI above).  Objective:   Physical Exam  BP (!) 109/55 (BP Location: Left Arm, Patient Position: Sitting, Cuff Size: Normal)   Pulse 71   Resp 15   Ht 5\' 10"  (1.778 m)   Wt 216 lb 3.2 oz (98.1 kg)   LMP  (LMP Unknown)   SpO2 97%   BMI 31.02 kg/m   Wt Readings from Last 3 Encounters:  04/20/21 217 lb (98.4 kg)  03/16/21 218 lb (98.9 kg)  02/21/21 228 lb (103.4 kg)    General: Appears her stated age, chronically ill-appearing, in NAD. Skin: Warm, dry and intact.  HEENT: Head: normal shape and size; Eyes: sclera white and EOMs intact; Cardiovascular: Normal rate and rhythm. S1,S2 noted.  No murmur, rubs or gallops noted.  Pulmonary/Chest: Normal effort and positive vesicular breath  sounds. No respiratory distress. No wheezes, rales or ronchi noted.  Musculoskeletal:  No difficulty with gait.  Neurological: Alert and oriented.  BMET    Component Value Date/Time   NA 139 12/21/2020 0957   NA 135 (L) 06/30/2013 1203   K 4.9 12/21/2020 0957   K 4.1 06/30/2013 1203   CL 101 12/21/2020 0957   CL 102 06/30/2013 1203   CO2 31 12/21/2020 0957   CO2 32 06/30/2013 1203   GLUCOSE 158 (H) 12/21/2020 0957   GLUCOSE 143 (H) 06/30/2013 1203   BUN 30 (H) 12/21/2020 0957   BUN 19 (H) 06/30/2013 1203   CREATININE 1.06 (H) 12/21/2020 0957   CALCIUM 9.2 12/21/2020 0957   CALCIUM 9.7 06/30/2013 1203   GFRNONAA 56 (L) 12/21/2020 0957   GFRAA 65 12/21/2020 0957    Lipid Panel     Component Value Date/Time   CHOL 135 12/21/2020 0957   TRIG 219 (H) 12/21/2020 0957   HDL 41 (L) 12/21/2020 0957   CHOLHDL 3.3 12/21/2020 0957   VLDL 31.6 03/12/2019 1255   LDLCALC 65 12/21/2020 0957    CBC    Component Value Date/Time   WBC 6.3 09/06/2020 1330   RBC 4.42 09/06/2020 1330   HGB 12.4 09/06/2020 1330   HGB 14.0 11/12/2011 1136   HCT 38.4 09/06/2020 1330   HCT 40.7 11/12/2011 1136   PLT 199 09/06/2020 1330   PLT 274 11/12/2011 1136   MCV 86.9 09/06/2020 1330   MCV 86 11/12/2011 1136   MCH 28.1 09/06/2020 1330   MCHC 32.3 09/06/2020 1330   RDW 13.8 09/06/2020 1330   RDW  12.8 11/12/2011 1136   LYMPHSABS 1.4 09/06/2020 1330   LYMPHSABS 2.5 11/12/2011 1136   MONOABS 0.5 09/06/2020 1330   MONOABS 0.8 11/12/2011 1136   EOSABS 0.2 09/06/2020 1330   EOSABS 0.6 11/12/2011 1136   BASOSABS 0.1 09/06/2020 1330   BASOSABS 0.1 11/12/2011 1136    Hgb A1C Lab Results  Component Value Date   HGBA1C 6.4 (H) 12/21/2020            Assessment & Plan:   Runny Nose, Nasal Congestion, Nausea and Vomiting:  Symptoms have completely resolved at this time Unclear if it was viral or withdrawal type symptoms from lack of her Pregabalin Encouraged rest She will monitor symptoms  for now  Update me for new or worsening symptoms.  [name], NP This visit occurred during the SARS-CoV-2 public health emergency.  Safety protocols were in place, including screening questions prior to the visit, additional usage of staff PPE, and extensive cleaning of exam room while observing appropriate contact time as indicated for disinfecting solutions.

## 2021-06-02 NOTE — Patient Instructions (Signed)
Influenza, Adult °Influenza is also called "the flu." It is an infection in the lungs, nose, and throat (respiratory tract). It spreads easily from person to person (is contagious). The flu causes symptoms that are like a cold, along with high fever and body aches. °What are the causes? °This condition is caused by the influenza virus. You can get the virus by: °Breathing in droplets that are in the air after a person infected with the flu coughed or sneezed. °Touching something that has the virus on it and then touching your mouth, nose, or eyes. °What increases the risk? °Certain things may make you more likely to get the flu. These include: °Not washing your hands often. °Having close contact with many people during cold and flu season. °Touching your mouth, eyes, or nose without first washing your hands. °Not getting a flu shot every year. °You may have a higher risk for the flu, and serious problems, such as a lung infection (pneumonia), if you: °Are older than 65. °Are pregnant. °Have a weakened disease-fighting system (immune system) because of a disease or because you are taking certain medicines. °Have a long-term (chronic) condition, such as: °Heart, kidney, or lung disease. °Diabetes. °Asthma. °Have a liver disorder. °Are very overweight (morbidly obese). °Have anemia. °What are the signs or symptoms? °Symptoms usually begin suddenly and last 4-14 days. They may include: °Fever and chills. °Headaches, body aches, or muscle aches. °Sore throat. °Cough. °Runny or stuffy (congested) nose. °Feeling discomfort in your chest. °Not wanting to eat as much as normal. °Feeling weak or tired. °Feeling dizzy. °Feeling sick to your stomach or throwing up. °How is this treated? °If the flu is found early, you can be treated with antiviral medicine. This can help to reduce how bad the illness is and how long it lasts. This may be given by mouth or through an IV tube. °Taking care of yourself at home can help your  symptoms get better. Your doctor may want you to: °Take over-the-counter medicines. °Drink plenty of fluids. °The flu often goes away on its own. If you have very bad symptoms or other problems, you may be treated in a hospital. °Follow these instructions at home: °  °Activity °Rest as needed. Get plenty of sleep. °Stay home from work or school as told by your doctor. °Do not leave home until you do not have a fever for 24 hours without taking medicine. °Leave home only to go to your doctor. °Eating and drinking °Take an ORS (oral rehydration solution). This is a drink that is sold at pharmacies and stores. °Drink enough fluid to keep your pee pale yellow. °Drink clear fluids in small amounts as you are able. Clear fluids include: °Water. °Ice chips. °Fruit juice mixed with water. °Low-calorie sports drinks. °Eat bland foods that are easy to digest. Eat small amounts as you are able. These foods include: °Bananas. °Applesauce. °Rice. °Lean meats. °Toast. °Crackers. °Do not eat or drink: °Fluids that have a lot of sugar or caffeine. °Alcohol. °Spicy or fatty foods. °General instructions °Take over-the-counter and prescription medicines only as told by your doctor. °Use a cool mist humidifier to add moisture to the air in your home. This can make it easier for you to breathe. °When using a cool mist humidifier, clean it daily. Empty water and replace with clean water. °Cover your mouth and nose when you cough or sneeze. °Wash your hands with soap and water often and for at least 20 seconds. This is also important after   you cough or sneeze. If you cannot use soap and water, use alcohol-based hand sanitizer. °Keep all follow-up visits. °How is this prevented? ° °Get a flu shot every year. You may get the flu shot in late summer, fall, or winter. Ask your doctor when you should get your flu shot. °Avoid contact with people who are sick during fall and winter. This is cold and flu season. °Contact a doctor if: °You get  new symptoms. °You have: °Chest pain. °Watery poop (diarrhea). °A fever. °Your cough gets worse. °You start to have more mucus. °You feel sick to your stomach. °You throw up. °Get help right away if you: °Have shortness of breath. °Have trouble breathing. °Have skin or nails that turn a bluish color. °Have very bad pain or stiffness in your neck. °Get a sudden headache. °Get sudden pain in your face or ear. °Cannot eat or drink without throwing up. °These symptoms may represent a serious problem that is an emergency. Get medical help right away. Call your local emergency services (911 in the [place] °Do not wait to see if the symptoms will go away. °Do not drive yourself to the hospital. °Summary °Influenza is also called "the flu." It is an infection in the lungs, nose, and throat. It spreads easily from person to person. °Take over-the-counter and prescription medicines only as told by your doctor. °Getting a flu shot every year is the best way to not get the flu. °This information is not intended to replace advice given to you by your health care provider. Make sure you discuss any questions you have with your health care provider. °Document Revised: 02/05/2020 Document Reviewed: 02/05/2020 °Elsevier Patient Education © 2022 [employer] ° °

## 2021-06-08 ENCOUNTER — Other Ambulatory Visit: Payer: Self-pay | Admitting: Cardiology

## 2021-06-19 ENCOUNTER — Other Ambulatory Visit: Payer: Self-pay | Admitting: Internal Medicine

## 2021-06-19 NOTE — Telephone Encounter (Signed)
Requested Prescriptions  Pending Prescriptions Disp Refills   ezetimibe (ZETIA) 10 MG tablet [Pharmacy Med Name: EZETIMIBE 10 MG TABLET] 90 tablet 1    Sig: TAKE 1 TABLET BY MOUTH EVERY DAY     Cardiovascular:  Antilipid - Sterol Transport Inhibitors Failed - 06/19/2021  1:57 AM      Failed - HDL in normal range and within 360 days    HDL  Date Value Ref Range Status  12/21/2020 41 (L) > OR = 50 mg/dL Final         Failed - Triglycerides in normal range and within 360 days    Triglycerides  Date Value Ref Range Status  12/21/2020 219 (H) <150 mg/dL Final    Comment:    . If a non-fasting specimen was collected, consider repeat triglyceride testing on a fasting specimen if clinically indicated.  Yates Decamp et al. J. of Clin. Lipidol. [id number];[id number]. Marland Kitchen          Passed - Total Cholesterol in normal range and within 360 days    Cholesterol  Date Value Ref Range Status  12/21/2020 135 <200 mg/dL Final         Passed - LDL in normal range and within 360 days    LDL Cholesterol (Calc)  Date Value Ref Range Status  12/21/2020 65 mg/dL (calc) Final    Comment:    Reference range: <100 . Desirable range <100 mg/dL for primary prevention;   <70 mg/dL for patients with CHD or diabetic patients  with > or = 2 CHD risk factors. Marland Kitchen LDL-C is now calculated using the Martin-Hopkins  calculation, which is a validated novel method providing  better accuracy than the Friedewald equation in the  estimation of LDL-C.  Cresenciano Genre et al. Annamaria Helling. 9811;914(78): 2061-2068  (http://education.[facility].com/faq/FAQ164)    Direct LDL  Date Value Ref Range Status  11/04/2017 63.0 mg/dL Final    Comment:    Optimal:  <100 mg/dLNear or Above Optimal:  100-129 mg/dLBorderline High:  130-159 mg/dLHigh:  160-189 mg/dLVery High:  >190 mg/dL         Passed - Valid encounter within last 12 months    Recent Outpatient Visits          2 weeks ago Stuffy and runny nose   [facility], [place] W, NP   6 months ago Type 2 diabetes mellitus with diabetic mononeuropathy, without long-term current use of insulin ([place])   [facility], Coralie Keens, NP      Future Appointments            In 3 days Baity, Coralie Keens, NP [place]   In 3 weeks Marlou Porch, Thana Farr, MD [facility]

## 2021-06-20 ENCOUNTER — Ambulatory Visit (HOSPITAL_BASED_OUTPATIENT_CLINIC_OR_DEPARTMENT_OTHER): Payer: Medicare Other | Admitting: Pain Medicine

## 2021-06-20 ENCOUNTER — Other Ambulatory Visit: Payer: Self-pay

## 2021-06-20 ENCOUNTER — Ambulatory Visit
Admission: RE | Admit: 2021-06-20 | Discharge: 2021-06-20 | Disposition: A | Discharge status: Home / Self Care | Payer: Medicare Other | Source: Ambulatory Visit | Attending: Pain Medicine | Admitting: Pain Medicine

## 2021-06-20 ENCOUNTER — Encounter: Payer: Self-pay | Admitting: Pain Medicine

## 2021-06-20 VITALS — BP 115/71 | HR 81 | Temp 96.9°F | Resp 16 | Ht 69.0 in | Wt 216.0 lb

## 2021-06-20 DIAGNOSIS — M533 Sacrococcygeal disorders, not elsewhere classified: Secondary | ICD-10-CM | POA: Diagnosis not present

## 2021-06-20 DIAGNOSIS — M961 Postlaminectomy syndrome, not elsewhere classified: Secondary | ICD-10-CM | POA: Insufficient documentation

## 2021-06-20 DIAGNOSIS — M5441 Lumbago with sciatica, right side: Secondary | ICD-10-CM | POA: Insufficient documentation

## 2021-06-20 DIAGNOSIS — M47816 Spondylosis without myelopathy or radiculopathy, lumbar region: Secondary | ICD-10-CM | POA: Diagnosis not present

## 2021-06-20 DIAGNOSIS — Z978 Presence of other specified devices: Secondary | ICD-10-CM

## 2021-06-20 DIAGNOSIS — Z79899 Other long term (current) drug therapy: Secondary | ICD-10-CM

## 2021-06-20 DIAGNOSIS — M25551 Pain in right hip: Secondary | ICD-10-CM | POA: Insufficient documentation

## 2021-06-20 DIAGNOSIS — G894 Chronic pain syndrome: Secondary | ICD-10-CM | POA: Insufficient documentation

## 2021-06-20 DIAGNOSIS — M542 Cervicalgia: Secondary | ICD-10-CM | POA: Insufficient documentation

## 2021-06-20 DIAGNOSIS — M5137 Other intervertebral disc degeneration, lumbosacral region: Secondary | ICD-10-CM | POA: Diagnosis not present

## 2021-06-20 DIAGNOSIS — Z79891 Long term (current) use of opiate analgesic: Secondary | ICD-10-CM

## 2021-06-20 DIAGNOSIS — G8929 Other chronic pain: Secondary | ICD-10-CM

## 2021-06-20 DIAGNOSIS — Z451 Encounter for adjustment and management of infusion pump: Secondary | ICD-10-CM | POA: Diagnosis not present

## 2021-06-20 DIAGNOSIS — M25552 Pain in left hip: Secondary | ICD-10-CM | POA: Diagnosis not present

## 2021-06-20 DIAGNOSIS — F1290 Cannabis use, unspecified, uncomplicated: Secondary | ICD-10-CM | POA: Diagnosis present

## 2021-06-20 DIAGNOSIS — F1190 Opioid use, unspecified, uncomplicated: Secondary | ICD-10-CM | POA: Diagnosis present

## 2021-06-20 DIAGNOSIS — G96198 Other disorders of meninges, not elsewhere classified: Secondary | ICD-10-CM | POA: Diagnosis not present

## 2021-06-20 MED ORDER — OXYCODONE-ACETAMINOPHEN 5-325 MG PO TABS: 1.0000 | ORAL_TABLET | Freq: Three times a day (TID) | ORAL | 0 refills | Status: DC | PRN

## 2021-06-20 NOTE — Progress Notes (Signed)
Nursing Pain Medication Assessment:  Safety precautions to be maintained throughout the outpatient stay will include: orient to surroundings, keep bed in low position, maintain call bell within reach at all times, provide assistance with transfer out of bed and ambulation.  Medication Inspection Compliance: Pill count conducted under aseptic conditions, in front of the patient. Neither the pills nor the bottle was removed from the patient's sight at any time. Once count was completed pills were immediately returned to the patient in their original bottle.  Medication: Oxycodone/APAP Pill/Patch Count:  34 of 90 pills remain Pill/Patch Appearance: Markings consistent with prescribed medication Bottle Appearance: Standard pharmacy container. Clearly labeled. Filled Date: 51 / 30 / 2022 Last Medication intake:  Today

## 2021-06-20 NOTE — Patient Instructions (Addendum)
____________________________________________________________________________________________  Medication Rules  Purpose: To inform patients, and their family members, of our rules and regulations.  Applies to: All patients receiving prescriptions (written or electronic).  Pharmacy of record: Pharmacy where electronic prescriptions will be sent. If written prescriptions are taken to a different pharmacy, please inform the nursing staff. The pharmacy listed in the electronic medical record should be the one where you would like electronic prescriptions to be sent.  Electronic prescriptions: In compliance with the [place] (STOP) Act of 2017 (Session Lanny Cramp [phone]), effective July 02, 2018, all controlled substances must be electronically prescribed. Calling prescriptions to the pharmacy will cease to exist.  Prescription refills: Only during scheduled appointments. Applies to all prescriptions.  NOTE: The following applies primarily to controlled substances (Opioid* Pain Medications).   Type of encounter (visit): For patients receiving controlled substances, face-to-face visits are required. (Not an option or up to the patient.)  Patient's responsibilities: Pain Pills: Bring all pain pills to every appointment (except for procedure appointments). Pill Bottles: Bring pills in original pharmacy bottle. Always bring the newest bottle. Bring bottle, even if empty. Medication refills: You are responsible for knowing and keeping track of what medications you take and those you need refilled. The day before your appointment: write a list of all prescriptions that need to be refilled. The day of the appointment: give the list to the admitting nurse. Prescriptions will be written only during appointments. No prescriptions will be written on procedure days. If you forget a medication: it will not be "Called in", "Faxed", or "electronically sent". You will  need to get another appointment to get these prescribed. No early refills. Do not call asking to have your prescription filled early. Prescription Accuracy: You are responsible for carefully inspecting your prescriptions before leaving our office. Have the discharge nurse carefully go over each prescription with you, before taking them home. Make sure that your name is accurately spelled, that your address is correct. Check the name and dose of your medication to make sure it is accurate. Check the number of pills, and the written instructions to make sure they are clear and accurate. Make sure that you are given enough medication to last until your next medication refill appointment. Taking Medication: Take medication as prescribed. When it comes to controlled substances, taking less pills or less frequently than prescribed is permitted and encouraged. Never take more pills than instructed. Never take medication more frequently than prescribed.  Inform other Doctors: Always inform, all of your healthcare providers, of all the medications you take. Pain Medication from other Providers: You are not allowed to accept any additional pain medication from any other Doctor or Healthcare provider. There are two exceptions to this rule. (see below) In the event that you require additional pain medication, you are responsible for notifying us, as stated below. Cough Medicine: Often these contain an opioid, such as codeine or hydrocodone. Never accept or take cough medicine containing these opioids if you are already taking an opioid* medication. The combination may cause respiratory failure and death. Medication Agreement: You are responsible for carefully reading and following our Medication Agreement. This must be signed before receiving any prescriptions from our practice. Safely store a copy of your signed Agreement. Violations to the Agreement will result in no further prescriptions. (Additional copies of our  Medication Agreement are available upon request.) Laws, Rules, & Regulations: All patients are expected to follow all Federal and [employer], [employer], Rules, [employer] [facility]. Ignorance of  the Laws does not constitute a valid excuse.  Illegal drugs and Controlled Substances: The use of illegal substances (including, but not limited to marijuana and its derivatives) and/or the illegal use of any controlled substances is strictly prohibited. Violation of this rule may result in the immediate and permanent discontinuation of any and all prescriptions being written by our practice. The use of any illegal substances is prohibited. Adopted CDC guidelines & recommendations: Target dosing levels will be at or below 60 MME/day. Use of benzodiazepines** is not recommended.  Exceptions: There are only two exceptions to the rule of not receiving pain medications from other Healthcare Providers. Exception #1 (Emergencies): In the event of an emergency (i.e.: accident requiring emergency care), you are allowed to receive additional pain medication. However, you are responsible for: As soon as you are able, call our office [phone], at any time of the day or night, and leave a message stating your name, the date and nature of the emergency, and the name and dose of the medication prescribed. In the event that your call is answered by a member of our staff, make sure to document and save the date, time, and the name of the person that took your information.  Exception #2 (Planned Surgery): In the event that you are scheduled by another doctor or dentist to have any type of surgery or procedure, you are allowed (for a period no longer than 30 days), to receive additional pain medication, for the acute post-op pain. However, in this case, you are responsible for picking up a copy of our "Post-op Pain Management for Surgeons" handout, and giving it to your surgeon or dentist. This document is available at our office, and  does not require an appointment to obtain it. Simply go to our office during business hours (Monday-Thursday from 8:00 AM to 4:00 PM) (Friday 8:00 AM to 12:00 Noon) or if you have a scheduled appointment with [place], prior to your surgery, and ask for it by name. In addition, you are responsible for: calling our office [phone], at any time of the day or night, and leaving a message stating your name, name of your surgeon, type of surgery, and date of procedure or surgery. Failure to comply with your responsibilities may result in termination of therapy involving the controlled substances. Medication Agreement Violation. Following the above rules, including your responsibilities will help you in avoiding a Medication Agreement Violation (Breaking your Pain Medication Contract).  *Opioid medications include: morphine, codeine, oxycodone, oxymorphone, hydrocodone, hydromorphone, meperidine, tramadol, tapentadol, buprenorphine, fentanyl, methadone. **Benzodiazepine medications include: diazepam (Valium), alprazolam (Xanax), clonazepam (Klonopine), lorazepam (Ativan), clorazepate (Tranxene), chlordiazepoxide (Librium), estazolam (Prosom), oxazepam (Serax), temazepam (Restoril), triazolam (Halcion) (Last updated: 03/29/2021) ____________________________________________________________________________________________  ____________________________________________________________________________________________  Medication Recommendations and Reminders  Applies to: All patients receiving prescriptions (written and/or electronic).  Medication Rules & Regulations: These rules and regulations exist for your safety and that of others. They are not flexible and neither are we. Dismissing or ignoring them will be considered "non-compliance" with medication therapy, resulting in complete and irreversible termination of such therapy. (See document titled "Medication Rules" for more details.) In all conscience,  because of safety reasons, we cannot continue providing a therapy where the patient does not follow instructions.  Pharmacy of record:  Definition: This is the pharmacy where your electronic prescriptions will be sent.  We do not endorse any particular pharmacy, however, we have experienced problems with [facility] not securing enough medication supply for the community. We do not restrict you  in your choice of pharmacy. However, once we write for your prescriptions, we will NOT be re-sending more prescriptions to fix restricted supply problems created by your pharmacy, or your insurance.  The pharmacy listed in the electronic medical record should be the one where you want electronic prescriptions to be sent. If you choose to change pharmacy, simply notify our nursing staff.  Recommendations: Keep all of your pain medications in a safe place, under lock and key, even if you live alone. We will NOT replace lost, stolen, or damaged medication. After you fill your prescription, take 1 week's worth of pills and put them away in a safe place. You should keep a separate, properly labeled bottle for this purpose. The remainder should be kept in the original bottle. Use this as your primary supply, until it runs out. Once it's gone, then you know that you have 1 week's worth of medicine, and it is time to come in for a prescription refill. If you do this correctly, it is unlikely that you will ever run out of medicine. To make sure that the above recommendation works, it is very important that you make sure your medication refill appointments are scheduled at least 1 week before you run out of medicine. To do this in an effective manner, make sure that you do not leave the office without scheduling your next medication management appointment. Always ask the nursing staff to show you in your prescription , when your medication will be running out. Then arrange for the receptionist to get you a return appointment,  at least 7 days before you run out of medicine. Do not wait until you have 1 or 2 pills left, to come in. This is very poor planning and does not take into consideration that we may need to cancel appointments due to bad weather, sickness, or emergencies affecting our staff. DO NOT ACCEPT A "Partial Fill": If for any reason your pharmacy does not have enough pills/tablets to completely fill or refill your prescription, do not allow for a "partial fill". The law allows the pharmacy to complete that prescription within 72 hours, without requiring a new prescription. If they do not fill the rest of your prescription within those 72 hours, you will need a separate prescription to fill the remaining amount, which we will NOT provide. If the reason for the partial fill is your insurance, you will need to talk to the pharmacist about payment alternatives for the remaining tablets, but again, DO NOT ACCEPT A PARTIAL FILL, unless you can trust your pharmacist to obtain the remainder of the pills within 72 hours.  Prescription refills and/or changes in medication(s):  Prescription refills, and/or changes in dose or medication, will be conducted only during scheduled medication management appointments. (Applies to both, written and electronic prescriptions.) No refills on procedure days. No medication will be changed or started on procedure days. No changes, adjustments, and/or refills will be conducted on a procedure day. Doing so will interfere with the diagnostic portion of the procedure. No phone refills. No medications will be "called into the pharmacy". No Fax refills. No weekend refills. No Holliday refills. No after hours refills.  Remember:  Business hours are:  Monday to Thursday 8:00 AM to 4:00 PM Provider's Schedule: [name], MD - Appointments are:  Medication management: Monday and Wednesday 8:00 AM to 4:00 PM Procedure day: Tuesday and Thursday 7:30 AM to 4:00 PM Gillis Santa, MD -  Appointments are:  Medication management: Tuesday and Thursday 8:00  AM to 4:00 PM Procedure day: Monday and Wednesday 7:30 AM to 4:00 PM (Last update: 01/20/2020) ____________________________________________________________________________________________  ____________________________________________________________________________________________  Drug Holidays (Slow)  What is a "Drug Holiday"? Drug Holiday: is the name given to the period of time during which a patient stops taking a medication(s) for the purpose of eliminating tolerance to the drug.  Benefits Improved effectiveness of opioids. Decreased opioid dose needed to achieve benefits. Improved pain with lesser dose.  What is tolerance? Tolerance: is the progressive decreased in effectiveness of a drug due to its repetitive use. With repetitive use, the body gets use to the medication and as a consequence, it loses its effectiveness. This is a common problem seen with opioid pain medications. As a result, a larger dose of the drug is needed to achieve the same effect that used to be obtained with a smaller dose.  How long should a "Drug Holiday" last? You should stay off of the pain medicine for at least 14 consecutive days. (2 weeks)  Should I stop the medicine "cold [place]"? No. You should always coordinate with your Pain Specialist so that he/she can provide you with the correct medication dose to make the transition as smoothly as possible.  How do I stop the medicine? Slowly. You will be instructed to decrease the daily amount of pills that you take by one (1) pill every seven (7) days. This is called a "slow downward taper" of your dose. For example: if you normally take four (4) pills per day, you will be asked to drop this dose to three (3) pills per day for seven (7) days, then to two (2) pills per day for seven (7) days, then to one (1) per day for seven (7) days, and at the end of those last seven (7) days, this is  when the "Drug Holiday" would start.   Will I have withdrawals? By doing a "slow downward taper" like this one, it is unlikely that you will experience any significant withdrawal symptoms. Typically, what triggers withdrawals is the sudden stop of a high dose opioid therapy. Withdrawals can usually be avoided by slowly decreasing the dose over a prolonged period of time. If you do not follow these instructions and decide to stop your medication abruptly, withdrawals may be possible.  What are withdrawals? Withdrawals: refers to the wide range of symptoms that occur after stopping or dramatically reducing opiate drugs after heavy and prolonged use. Withdrawal symptoms do not occur to patients that use low dose opioids, or those who take the medication sporadically. Contrary to benzodiazepine (example: Valium, Xanax, etc.) or alcohol withdrawals (Delirium Tremens), opioid withdrawals are not lethal. Withdrawals are the physical manifestation of the body getting rid of the excess receptors.  Expected Symptoms Early symptoms of withdrawal may include: Agitation Anxiety Muscle aches Increased tearing Insomnia Runny nose Sweating Yawning  Late symptoms of withdrawal may include: Abdominal cramping Diarrhea Dilated pupils Goose bumps Nausea Vomiting  Will I experience withdrawals? Due to the slow nature of the taper, it is very unlikely that you will experience any.  What is a slow taper? Taper: refers to the gradual decrease in dose.  (Last update: 01/20/2020) ____________________________________________________________________________________________   ____________________________________________________________________________________________  CBD (cannabidiol) & Delta-8 (Delta-8 tetrahydrocannabinol) WARNING  Intro: Cannabidiol (CBD) and tetrahydrocannabinol (THC), are two natural compounds found in plants of the Cannabis genus. They can both be extracted from hemp or cannabis.  Hemp and cannabis come from the Cannabis sativa plant. Both compounds interact with your bodys endocannabinoid system,  but they have very different effects. CBD does not produce the high sensation associated with cannabis. Delta-8 tetrahydrocannabinol, also known as delta-8 THC, is a psychoactive substance found in the Cannabis sativa plant, of which marijuana and hemp are two varieties. THC is responsible for the high associated with the illicit use of marijuana.  Applicable to: All individuals currently taking or considering taking CBD (cannabidiol) and, more important, all patients taking opioid analgesic controlled substances (pain medication). (Example: oxycodone; oxymorphone; hydrocodone; hydromorphone; morphine; methadone; tramadol; tapentadol; fentanyl; buprenorphine; butorphanol; dextromethorphan; meperidine; codeine; etc.)  Legal status: CBD remains a Schedule I drug prohibited for any use. CBD is illegal with one exception. In the [place], CBD has a limited Transport planner (FDA) approval for the treatment of two specific types of epilepsy disorders. Only one CBD product has been approved by the FDA for this purpose: "Epidiolex". FDA is aware that some companies are marketing products containing cannabis and cannabis-derived compounds in ways that violate the [employer], Drug and Cosmetic Act [facility] Act) and that may put the health and safety of consumers at risk. The FDA, a Federal agency, has not enforced the CBD status since 2018.   Legality: Some manufacturers ship CBD products nationally, which is illegal. Often such products are sold online and are therefore available throughout the country. CBD is openly sold in head shops and health food stores in some states where such sales have not been explicitly legalized. Selling unapproved products with unsubstantiated therapeutic claims is not only a violation of the law, but also can put patients at risk, as these products have  not been proven to be safe or effective. Federal illegality makes it difficult to conduct research on CBD.  Reference: "FDA Regulation of Cannabis and Cannabis-Derived Products, Including Cannabidiol (CBD)" - [employer]  Warning: CBD is not FDA approved and has not undergo the same manufacturing controls as prescription drugs.  This means that the purity and safety of available CBD may be questionable. Most of the time, despite manufacturer's claims, it is contaminated with THC (delta-9-tetrahydrocannabinol - the chemical in marijuana responsible for the "HIGH").  When this is the case, the [facility] contaminant will trigger a positive urine drug screen (UDS) test for Marijuana (carboxy-THC). Because a positive UDS for any illicit substance is a violation of our medication agreement, your opioid analgesics (pain medicine) may be permanently discontinued. The FDA recently put out a warning about 5 things that everyone should be aware of regarding Delta-8 THC: Delta-8 THC products have not been evaluated or approved by the FDA for safe use and may be marketed in ways that put the public health at risk. The FDA has received adverse event reports involving delta-8 THC-containing products. Delta-8 THC has psychoactive and intoxicating effects. Delta-8 THC manufacturing often involve use of potentially harmful chemicals to create the concentrations of delta-8 THC claimed in the marketplace. The final delta-8 THC product may have potentially harmful by-products (contaminants) due to the chemicals used in the process. Manufacturing of delta-8 THC products may occur in uncontrolled or unsanitary settings, which may lead to the presence of unsafe contaminants or other potentially harmful substances. Delta-8 THC products should be kept out of the reach of children and pets.  MORE ABOUT CBD  General  Information: CBD was discovered in [birth year] and it is a derivative of the cannabis sativa genus plants (Marijuana and Hemp). It is one of the 113 identified substances found in Marijuana. It accounts for up to 40% of the plant's  extract. As of 2018, preliminary clinical studies on CBD included research for the treatment of anxiety, movement disorders, and pain. CBD is available and consumed in multiple forms, including inhalation of smoke or vapor, as an aerosol spray, and by mouth. It may be supplied as an oil containing CBD, capsules, dried cannabis, or as a liquid solution. CBD is thought not to be as psychoactive as THC (delta-9-tetrahydrocannabinol - the chemical in marijuana responsible for the "HIGH"). Studies suggest that CBD may interact with different biological target receptors in the body, including cannabinoid and other neurotransmitter receptors. As of 2018 the mechanism of action for its biological effects has not been determined.  Side-effects   Adverse reactions: Dry mouth, diarrhea, decreased appetite, fatigue, drowsiness, malaise, weakness, sleep disturbances, and others.  Drug interactions: CBC may interact with other medications such as blood-thinners. (Last update: 03/31/2021) ____________________________________________________________________________________________  Opioid Overdose Opioids are drugs that are often used to treat pain. Opioids include illegal drugs, such as heroin, as well as prescription pain medicines, such as codeine, morphine, hydrocodone, and fentanyl. An opioid overdose happens when you take too much of an opioid. An overdose may be intentional or accidental and can happen with any type of opioid. The effects of an overdose can be mild, dangerous, or even deadly. Opioid overdose is a medical emergency. What are the causes? This condition may be caused by: Taking too much of an opioid on purpose. Taking too much of an opioid by accident. Using two or more  substances that contain opioids at the same time. Taking an opioid with a substance that affects your heart, breathing, or blood pressure. These include alcohol, tranquilizers, sleeping pills, illegal drugs, and some over-the-counter medicines. This condition may also happen due to an error made by: A health care provider who prescribes a medicine. The pharmacist who fills the prescription. What increases the risk? This condition is more likely in: Children. They may be attracted to colorful pills. Because of a child's small size, even a small amount of a medicine can be dangerous. Older people. They may be taking many different medicines. Older people may have difficulty reading labels or remembering when they last took their medicines. They may also be more sensitive to the effects of opioids. People with chronic medical conditions, especially heart, liver, kidney, or neurological diseases. People who take an opioid for a long period of time. People who take opioids and use illegal drugs, such as heroin, or other substances, such as alcohol. People who: Have a history of drug or alcohol abuse. Have certain mental health conditions. Have a history of previous drug overdoses. People who take opioids that are not prescribed for them. What are the signs or symptoms? Symptoms of this condition depend on the type of opioid and the amount that was taken. Common symptoms include: Sleepiness or difficulty waking from sleep. Confusion. Slurred speech. Slowed breathing and a slow pulse (bradycardia). Nausea and vomiting. Abnormally small pupils. Signs and symptoms that require emergency treatment include: Cold, clammy, and pale skin. Blue lips and fingernails. Vomiting. Gurgling sounds in the throat. A pulse that is very slow or difficult to detect. Breathing that is very irregular, slow, noisy, or difficult to detect. Inability to respond to speech or be awakened from sleep  (stupor). Seizures. How is this diagnosed? This condition is diagnosed based on your symptoms and medical history. It is important to tell your health care provider: About all of the opioids that you took. When you took the opioids. Whether you  were drinking alcohol or using marijuana, cocaine, or other drugs. Your health care provider will do a physical exam. This exam may include: Checking and monitoring your heart rate and rhythm, breathing rate, temperature, and blood pressure. Measuring oxygen levels in your blood. Checking for abnormally small pupils. You may also have blood tests or urine tests. You may have X-rays if you are having severe breathing problems. How is this treated? This condition requires immediate medical treatment and hospitalization. Reversing the effects of the opioid is the first step in treatment. If you have a Narcan kit or naloxone, use it right away. Follow your health care provider's instructions. A friend or family member can also help you with this. The rest of your treatment will be given in the hospital intensive care (ICU). Treatment in the hospital may include: Giving salts and minerals (electrolytes) along with fluids through an IV. Inserting a breathing tube (endotracheal tube) in your airway to help you breathe if you cannot breathe on your own or you are in danger of not being able to breathe on your own. Giving oxygen through a small tube under your nose. Passing a tube through your nose and into your stomach (nasogastric tube, or NG tube) to empty your stomach. Giving medicines that: Increase your blood pressure. Relieve nausea and vomiting. Relieve abdominal pain and cramping. Reverse the effects of the opioid (naloxone). Monitoring your heart and oxygen levels. Ongoing counseling and mental health support if you intentionally overdosed or used an illegal drug. Follow these instructions at home: Medicines Take over-the-counter and prescription  medicines only as told by your health care provider. Always ask your health care provider about possible side effects and interactions of any new medicine that you start taking. Keep a list of all the medicines that you take, including over-the-counter medicines. Bring this list with you to all your medical visits. General instructions Drink enough fluid to keep your urine pale yellow. Keep all follow-up visits. This is important. How is this prevented? Read the drug inserts that come with your opioid pain medicines. Take medicines only as told by your health care provider. Do not take more medicine than you are told. Do not take medicines more frequently than you are told. Do not drink alcohol or take sedatives when taking opioids. Do not use illegal or recreational drugs, including cocaine, ecstasy, and marijuana. Do not take opioid medicines that are not prescribed for you. Store all medicines in safety containers that are out of the reach of children. Get help if you are struggling with: Alcohol or drug use. Depression or another mental health problem. Thoughts of hurting yourself or another person. Keep the phone number of your local poison control center near your phone or in your mobile phone. In the [place] the hotline of the [facility] is 602-708-6403. If you were prescribed naloxone, make sure you understand how to take it. Contact a health care provider if: You need help understanding how to take your pain medicines. You feel your medicines are too strong. You are concerned that your pain medicines are not working well for your pain. You develop new symptoms or side effects when you are taking medicines. Get help right away if: You or someone else is having symptoms of an opioid overdose. Get help even if you are not sure. You have thoughts about hurting yourself or others. You have: Chest pain. Difficulty breathing. A loss of consciousness. These  symptoms may represent a serious problem that is  an emergency. Do not wait to see if the symptoms will go away. Get medical help right away. Call your local emergency services (911 in the [place] Do not drive yourself to the hospital. If you ever feel like you may hurt yourself or others, or have thoughts about taking your own life, get help right away. You can go to your nearest emergency department or: Call your local emergency services (911 in the [place] Call a suicide crisis helpline, such as the [facility] at (269)011-6176 or 988 in the [place]. This is open 24 hours a day in the [place] Text the Crisis Text Line at (978)761-3181 (in the [place].). Summary Opioids are drugs that are often used to treat pain. Opioids include illegal drugs, such as heroin, as well as prescription pain medicines. An opioid overdose happens when you take too much of an opioid. Overdoses can be intentional or accidental. Opioid overdose is very dangerous. It is a life-threatening emergency. If you or someone you know is experiencing an opioid overdose, get help right away. This information is not intended to replace advice given to you by your health care provider. Make sure you discuss any questions you have with your health care provider. Document Revised: 01/11/2021 Document Reviewed: 09/28/2020 [employer] Patient Education  [place].

## 2021-06-20 NOTE — Progress Notes (Signed)
PROVIDER NOTE: Information contained herein reflects review and annotations entered in association with encounter. Interpretation of such information and data should be left to medically-trained personnel. Information provided to patient can be located elsewhere in the medical record under "Patient Instructions". Document created using STT-dictation technology, any transcriptional errors that may result from process are unintentional.    Patient: Melinda [name]  Service Category: Procedure Provider: Gaspar Cola, MD DOB: 31-Oct-1955 DOS: 06/20/2021 Location: [facility] MRN: [id number] Setting: Ambulatory - outpatient Referring Provider: Jearld Fenton, NP Type: Established Patient Specialty: Interventional Pain Management PCP: Jearld Fenton, NP  Primary Reason for Visit: Interventional Pain Management Treatment. CC: Hip Pain (Bilateral ) and Back Pain (Lumbar bilateral  right is worse )  Procedure:          Type: Management of Intrathecal Drug Delivery System (IDDS) - Reservoir Refill [phone]). No rate change.  Indications: 1. Chronic pain syndrome   2. Chronic low back pain (Bilateral) (L>R) w/ sciatica (Right)   3. DDD (degenerative disc disease), lumbosacral   4. Failed back surgical syndrome   5. Epidural fibrosis   6. Lumbar facet syndrome   7. 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 Encounter for interrogation of infusion pump   9. Encounter for adjustment or management of infusion pump   10. Long term current use of opiate analgesic   11. Opiate use (22.5 MME/Day) (oral)   12. Pharmacologic therapy   13. Chronic use of opiate for therapeutic purpose   14. Encounter for medication management   15. Encounter for chronic pain management    Pain Assessment: Self-Reported Pain Score: 3 /10             Reported level is compatible with observation.        Patient indicates using CBD products for her pain.    Intrathecal Drug Delivery System (IDDS)  Pump Device:  Manufacturer: Medtronic Model: Synchromed II Model No.: [id number] Serial No.: [id number] H Delivery Route: Intrathecal Type: Programmable  Volume (mL): 40 mL reservoir Priming Volume: n/a  Calibration Constant: 113.0  MRI compatibility: Conditional   Implant Details:  Date: 03/11/2020  Implanter: Melinda Pointer, MD  Contact Information: [place], [place] Last Revision/Replacement: 03/11/2020 Estimated Replacement Date: Jul 2026  Implant Site: Abdominal Laterality: Right  Catheter: Manufacturer: Medtronic Model: Ascenda Model No.: [id number]  Serial No.: [id number]  Implanted Length (cm): 102.1  Catheter Volume (mL): 0.225  Tip Location (Level): T6-7 Canal Access Site: T12-L1  Drug content:  Primary Medication Class: Opioid  Medication: PF-Fentanyl  Concentration: 500 mcg/mL  Secondary Medication Class: Local Anesthetic  Medication: PF-Bupivacaine  Concentration: 10 mg/mL  Tertiary Medication Class: Antispasmodic  Medication: PF-Baclofen  Concentration: 120 mcg/mL  Fourth Medication Class: none   PA parameters (PCA-mode):  Mode: Off (Inactive)  Programming:  Type: Simple continuous.  Medication, Concentration, Infusion Program, & Delivery Rate: For up-to-date details please see most recent scanned programming printout.    Changes:  Medication Change: None at this point Rate Change: No change in rate  Reported side-effects or adverse reactions: None reported  Effectiveness: Described as relatively effective, allowing for increase in activities of daily living (ADL) Clinically meaningful improvement in function (CMIF): Sustained CMIF goals met  Plan: Pump refill today   Pharmacotherapy Assessment   Opioid Analgesic: Oxycodone 5 mg, 1 tab PO q 8 hrs (15 mg/day of oxycodone) + Intrathecal PF-Fentanyl MME/day: 22.5 mg/day (oral).   Monitoring: [place] PMP:  PDMP reviewed during this encounter.        Pharmacotherapy: No side-effects or adverse reactions reported. Compliance: No problems identified. Effectiveness: Clinically acceptable. Plan: Refer to "POC". UDS:  Summary  Date Value Ref Range Status  07/24/2018 FINAL  Final    Comment:    ==================================================================== TOXASSURE SELECT 13 (MW) ==================================================================== Test                             Result       Flag       Units Drug Present and Declared for Prescription Verification   Oxycodone                      754          EXPECTED   ng/mg creat   Oxymorphone                    231          EXPECTED   ng/mg creat   Noroxycodone                   2463         EXPECTED   ng/mg creat   Noroxymorphone                 89           EXPECTED   ng/mg creat    Sources of oxycodone are scheduled prescription medications.    Oxymorphone, noroxycodone, and noroxymorphone are expected    metabolites of oxycodone. Oxymorphone is also available as a    scheduled prescription medication. Drug Present not Declared for Prescription Verification   Fentanyl                       4            UNEXPECTED ng/mg creat   Norfentanyl                    29           UNEXPECTED ng/mg creat    Source of fentanyl is a scheduled prescription medication,    including IV, patch, and transmucosal formulations. Norfentanyl    is an expected metabolite of fentanyl. ==================================================================== Test                      Result    Flag   Units      Ref Range   Creatinine              134              mg/dL      >=20 ==================================================================== Declared Medications:  The flagging and interpretation on this report are based on the  following declared medications.  Unexpected results may arise from  inaccuracies in the declared medications.  **Note: The testing scope of this panel includes these  medications:  Oxycodone  **Note: The testing scope of this panel does not include following  reported medications:  Aspirin (Aspirin 81)  Bupropion (Wellbutrin)  Chondroitin (Glucosamine-Chondroitin)  Cinnamon  Glipizide (Glucotrol)  Glucosamine (Glucosamine-Chondroitin)  Magnesium  Multivitamin  Naloxone  Nitroglycerin (Nitrostat)  Paroxetine  Polyethylene Glycol (GlycoLAX)  Polyethylene Glycol (MiraLAX)  Pregabalin (Lyrica)  Rosuvastatin (Crestor)  Supplement (Omega-3)  Tizanidine (Zanaflex)  Turmeric  Ubiquinone (Coenzyme Q 10)  Vitamin D2 (Drisdol)  Vitamin E ==================================================================== For clinical consultation, please call [phone]. ====================================================================      Pre-op H&P Assessment:  Ms. Lascola is a 65 y.o. (year old), female patient, seen today for interventional treatment. 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 Ms. Omara has a current medication list which includes the following prescription(s): accu-chek guide,  accu-chek softclix lancets, AMBULATORY NON FORMULARY MEDICATION, vitamin c, aspirin, b complex vitamins, biotin, accu-chek guide control, bupropion, calcium-magnesium-zinc, vitamin d, cinnamon, ezetimibe, glucosamine-chondroitin, insulin pen needle, magnesium, nitroglycerin, NONFORMULARY OR COMPOUNDED ITEM, fish oil, [START ON 06/28/2021] oxycodone-acetaminophen, [START ON 07/28/2021] oxycodone-acetaminophen, [START ON 08/27/2021] oxycodone-acetaminophen, ozempic (0.25 or 0.5 mg/dose), paroxetine, polyethylene glycol, pregabalin, rosuvastatin, tizanidine, and vitamin e. Her primarily concern today is the Hip Pain (Bilateral ) and Back Pain (Lumbar bilateral  right is worse )  Initial Vital Signs:  Pulse/HCG Rate: 81  Temp: (!) 96.9 F (36.1 C) Resp: 16 BP: 115/71 SpO2: 97 %  BMI: Estimated body mass index is 31.9 kg/m as calculated from the following:   Height as of this encounter: 5' 9" (1.753 m).   Weight as of this encounter: 216 lb (98 kg).  Risk Assessment: Allergies: Reviewed. She has No Known Allergies.  Allergy Precautions: None required Coagulopathies: Reviewed. None identified.  Blood-thinner therapy: None at this time Active Infection(s): Reviewed. None identified. Ms. Duell is afebrile  Site Confirmation: Ms. Mcfate was asked to confirm the procedure and laterality before marking the site Procedure checklist: Completed Consent: Before the procedure and under the influence of no sedative(s), amnesic(s), or anxiolytics, the patient was informed of the treatment options, risks and possible complications. To fulfill our ethical and legal obligations, as recommended by the American Medical Association's Code of Ethics, I have informed the patient of my clinical impression; the nature and purpose of the treatment or procedure; the risks, benefits, and possible complications of the intervention; the alternatives, including doing nothing; the risk(s) and benefit(s) of the alternative  treatment(s) or procedure(s); and the risk(s) and benefit(s) of doing nothing.  Ms. Finchum was provided with information about the general risks and possible complications associated with most interventional procedures. These include, but are not limited to: failure to achieve desired goals, infection, bleeding, organ or nerve damage, allergic reactions, paralysis, and/or death.  In addition, she was informed of those risks and possible complications associated to this particular procedure, which include, but are not limited to: damage to the implant; failure to decrease pain; local, systemic, or serious CNS infections, intraspinal abscess with possible cord compression and paralysis, or life-threatening such as meningitis; bleeding; organ damage; nerve injury or damage with subsequent sensory, motor, and/or autonomic system dysfunction, resulting in transient or permanent pain, numbness, and/or weakness of one or several areas of the body; allergic reactions, either minor or major life-threatening, such as anaphylactic or anaphylactoid reactions.  Furthermore, Ms. Brassfield was informed of those risks and complications associated  with the medications. These include, but are not limited to: allergic reactions (i.e.: anaphylactic or anaphylactoid reactions); endorphine suppression; bradycardia and/or hypotension; water retention and/or peripheral vascular relaxation leading to lower extremity edema and possible stasis ulcers; respiratory depression and/or shortness of breath; decreased metabolic rate leading to weight gain; swelling or edema; medication-induced neural toxicity; particulate matter embolism and blood vessel occlusion with resultant organ, and/or nervous system infarction; and/or intrathecal granuloma formation with possible spinal cord compression and permanent paralysis.  Before refilling the pump Ms. Rocco was informed that some of the medications used in the devise may not be FDA approved  for such use and therefore it constitutes an off-label use of the medications.  Finally, she was informed that Medicine is not an exact science; therefore, there is also the possibility of unforeseen or unpredictable risks and/or possible complications that may result in a catastrophic outcome. The patient indicated having understood very clearly. We have given the patient no guarantees and we have made no promises. Enough time was given to the patient to ask questions, all of which were answered to the patient's satisfaction. Ms. Hoefer has indicated that she wanted to continue with the procedure. Attestation: I, the ordering provider, attest that I have discussed with the patient the benefits, risks, side-effects, alternatives, likelihood of achieving goals, and potential problems during recovery for the procedure that I have provided informed consent. Date   Time: 06/20/2021  1:26 PM  Pre-Procedure Preparation:  Monitoring: As per clinic protocol. Respiration, ETCO2, SpO2, BP, heart rate and rhythm monitor placed and checked for adequate function Safety Precautions: Patient was assessed for positional comfort and pressure points before starting the procedure. Time-out: I initiated and conducted the "Time-out" before starting the procedure, as per protocol. The patient was asked to participate by confirming the accuracy of the "Time Out" information. Verification of the correct person, site, and procedure were performed and confirmed by me, the nursing staff, and the patient. "Time-out" conducted as per Joint Commission's Universal Protocol (UP.01.01.01). Time: 1332  Description of Procedure:          Position: Supine Target Area: Central-port of intrathecal pump. Approach: Anterior, 90 degree angle approach. Area Prepped: Entire Area around the pump implant. DuraPrep (Iodine Povacrylex [0.7% available iodine] and Isopropyl Alcohol, 74% w/w) Safety Precautions: Aspiration looking for blood  return was conducted prior to all injections. At no point did we inject any substances, as a needle was being advanced. No attempts were made at seeking any paresthesias. Safe injection practices and needle disposal techniques used. Medications properly checked for expiration dates. SDV (single dose vial) medications used. Description of the Procedure: Protocol guidelines were followed. Two nurses trained to do implant refills were present during the entire procedure. The refill medication was checked by both healthcare providers as well as the patient. The patient was included in the "Time-out" to verify the medication. The patient was placed in position. The pump was identified. The area was prepped in the usual manner. The sterile template was positioned over the pump, making sure the side-port location matched that of the pump. Both, the pump and the template were held for stability. The needle provided in the Medtronic Kit was then introduced thru the center of the template and into the central port. The pump content was aspirated and discarded volume documented. The new medication was slowly infused into the pump, thru the filter, making sure to avoid overpressure of the device. The needle was then removed and the area cleansed, making  sure to leave some of the prepping solution back to take advantage of its long term bactericidal properties. The pump was interrogated and programmed to reflect the correct medication, volume, and dosage. The program was printed and taken to the physician for approval. Once checked and signed by the physician, a copy was provided to the patient and another scanned into the EMR.  Vitals:   06/20/21 1305  BP: 115/71  Pulse: 81  Resp: 16  Temp: (!) 96.9 F (36.1 C)  TempSrc: Temporal  SpO2: 97%  Weight: 216 lb (98 kg)  Height: 5' 9" (1.753 m)    Start Time: 1332 hrs. End Time: 1353 hrs. Materials & Medications: Medtronic Refill Kit Medication(s): Please see chart  orders for details.  Imaging Guidance:          Type of Imaging Technique: Fluoroscopy Guidance (Non-spinal) Indication(s):  difficulty accessing port due to pump migration Exposure Time: Please see nurses notes. Contrast: None used. Fluoroscopic Guidance: I was personally present during the use of fluoroscopy. "Tunnel Vision Technique" used to obtain the best possible view of the target area. Parallax error corrected before commencing the procedure. "Direction-depth-direction" technique used to introduce the needle under continuous pulsed fluoroscopy. Once target was reached, antero-posterior, oblique, and lateral fluoroscopic projection used confirm needle placement in all planes. Images permanently stored in EMR. Ultrasound Guidance: N/A Interpretation: No contrast injected. I personally interpreted the imaging intraoperatively. Adequate needle placement confirmed in multiple planes. Permanent images saved into the patient's record.  Antibiotic Prophylaxis:   Anti-infectives (From admission, onward)    None      Indication(s): None identified  Post-operative Assessment:  Post-procedure Vital Signs:  Pulse/HCG Rate: 81  Temp: (!) 96.9 F (36.1 C) Resp: 16 BP: 115/71 SpO2: 97 %  EBL: None  Complications: No immediate post-treatment complications observed by team, or reported by patient.  Note: The patient tolerated the entire procedure well. A repeat set of vitals were taken after the procedure and the patient was kept under observation following institutional policy, for this type of procedure. Post-procedural neurological assessment was performed, showing return to baseline, prior to discharge. The patient was provided with post-procedure discharge instructions, including a section on how to identify potential problems. Should any problems arise concerning this procedure, the patient was given instructions to immediately contact us, at any time, without hesitation. In any case,  we plan to contact the patient by telephone for a follow-up status report regarding this interventional procedure.  Comments:  No additional relevant information.  Plan of Care  Orders:  Orders Placed This Encounter  Procedures   PUMP REFILL    Maintain Protocol by having two(2) healthcare providers during procedure and programming.    Scheduling Instructions:     Please refill intrathecal pump today.    Order Specific Question:   Where will this procedure be performed?    Answer:   [facility]   PUMP REFILL    Whenever possible schedule on a procedure today.    Standing Status:   Future    Standing Expiration Date:   10/19/2021    Scheduling Instructions:     Please schedule intrathecal pump refill based on pump programming. Avoid schedule intervals of more than 120 days (4 months).    Order Specific Question:   Where will this procedure be performed?    Answer:   [facility]   DG PAIN CLINIC C-ARM 1-60 MIN NO REPORT    Intraoperative interpretation by procedural physician at  [facility].    Standing Status:   Standing    Number of Occurrences:   1    Order Specific Question:   Reason for exam:    Answer:   Assistance in needle guidance and placement for procedures requiring needle placement in or near specific anatomical locations not easily accessible without such assistance.   ToxASSURE Select 13 (MW), Urine    Volume: 30 ml(s). Minimum 3 ml of urine is needed. Document temperature of fresh sample. Indications: Long term (current) use of opiate analgesic (Z79.891)    Order Specific Question:   Release to patient    Answer:   Immediate   Cannabidiol (CBD)/Tetrahydrocannabinol (THC) Ratio, Urine    Order Specific Question:   Release to patient    Answer:   Immediate   THC-COOH, cord qualitative    Order Specific Question:   Release to patient    Answer:   Immediate   Cannabidiol (CBD)/Tetrahydrocannabinol (THC) Ratio, Urine    Order Specific  Question:   Release to patient    Answer:   Immediate   Informed Consent Details: Physician/Practitioner Attestation; Transcribe to consent form and obtain patient signature    Transcribe to consent form and obtain patient signature.    Order Specific Question:   Physician/Practitioner attestation of informed consent for procedure/surgical case    Answer:   I, the physician/practitioner, attest that I have discussed with the patient the benefits, risks, side effects, alternatives, likelihood of achieving goals and potential problems during recovery for the procedure that I have provided informed consent.    Order Specific Question:   Procedure    Answer:   Intrathecal pump refill    Order Specific Question:   Physician/Practitioner performing the procedure    Answer:   Attending Physician: Kathlen Brunswick. Dossie Arbour, MD & designated trained staff    Order Specific Question:   Indication/Reason    Answer:   Chronic Pain Syndrome (G89.4), presence of an intrathecal pump (Z97.8)   Chronic Opioid Analgesic:  Oxycodone 5 mg, 1 tab PO q 8 hrs (15 mg/day of oxycodone) + Intrathecal PF-Fentanyl MME/day: 22.5 mg/day (oral).   Medications ordered for procedure: Meds ordered this encounter  Medications   oxyCODONE-acetaminophen (PERCOCET) 5-325 MG tablet    Sig: Take 1 tablet by mouth every 8 (eight) hours as needed for severe pain. Each refill must last 30 days.    Dispense:  90 tablet    Refill:  0    DO NOT: delete (not duplicate); no partial-fill (will deny script to complete), no refill request (F/U required). DISPENSE: 1 day early if closed on fill date. WARN: No CNS-depressants within 8 hrs of med.   oxyCODONE-acetaminophen (PERCOCET) 5-325 MG tablet    Sig: Take 1 tablet by mouth every 8 (eight) hours as needed for severe pain. Each refill must last 30 days.    Dispense:  90 tablet    Refill:  0    DO NOT: delete (not duplicate); no partial-fill (will deny script to complete), no refill request  (F/U required). DISPENSE: 1 day early if closed on fill date. WARN: No CNS-depressants within 8 hrs of med.   Medications administered: Melinda [name] had no medications administered during this visit.  See the medical record for exact dosing, route, and time of administration.  Follow-up plan:   Return for Pump Refill (Max:77mo.       Interventional Therapies  Risk   Complexity Considerations:   Estimated body mass index is 33.45 kg/m  as calculated from the following:   Height as of this encounter: 5' 8" (1.727 m).   Weight as of this encounter: 220 lb (99.8 kg). WNL   Planned   Pending:   Diagnostic bilateral lumbar facet block  Possible bilateral therapeutic lumbar facet RFA #1    Under consideration:   Possible left lumbar facet RFA  Possible right lumbar facet RFA  Diagnostic caudal ESI + diagnostic epidurogram  Possible Racz procedure  Diagnostic bilateral IA knee injection (Steroid) Diagnostic bilateral genicular NB  Possible bilateral genicular nerve RFA  Possible bilateral Hyalgan knee injection  Diagnostic cervical ESI  Diagnostic bilateral cervical facet block  Possible bilateral cervical facet RFA    Completed:   Diagnostic/therapeutic left L2-3 LESI x1  Diagnostic/therapeutic right lumbar facet block x3 (06/23/2019) (100/100/75/>75)  Diagnostic/therapeutic left lumbar facet block x1 (06/23/2019) (100/100/75/>75)    Therapeutic   Palliative (PRN) options:   Palliative/therapeutic intrathecal pump management (refills/programming adjustments)  Palliative left L2-3 LESI #2  Palliative right lumbar facet block #4  Diagnostic left lumbar facet block #2       Recent Visits Date Type Provider Dept  04/20/21 Procedure visit Melinda Pointer, MD [facility]  04/04/21 Office Visit Melinda Pointer, MD [facility]  Showing recent visits within past 90 days and meeting all other requirements Today's Visits Date Type Provider Dept  06/20/21  Procedure visit Melinda Pointer, MD [facility]  Showing today's visits and meeting all other requirements Future Appointments Date Type Provider Dept  08/17/21 Appointment Melinda Pointer, MD [facility]  Showing future appointments within next 90 days and meeting all other requirements  Disposition: Discharge home  Discharge (Date   Time): 06/20/2021; 1407 hrs.   Primary Care Physician: Jearld Fenton, NP Location: [facility] Note by: Gaspar Cola, MD Date: 06/20/2021; Time: 2:48 PM  Disclaimer:  Medicine is not an Chief Strategy Officer. The only guarantee in medicine is that nothing is guaranteed. It is important to note that the decision to proceed with this intervention was based on the information collected from the patient. The Data and conclusions were drawn from the patient's questionnaire, the interview, and the physical examination. Because the information was provided in large part by the patient, it cannot be guaranteed that it has not been purposely or unconsciously manipulated. Every effort has been made to obtain as much relevant data as possible for this evaluation. It is important to note that the conclusions that lead to this procedure are derived in large part from the available data. Always take into account that the treatment will also be dependent on availability of resources and existing treatment guidelines, considered by other Pain Management Practitioners as being common knowledge and practice, at the time of the intervention. For Medico-Legal purposes, it is also important to point out that variation in procedural techniques and pharmacological choices are the acceptable norm. The indications, contraindications, technique, and results of the above procedure should only be interpreted and judged by a Board-Certified Interventional Pain Specialist with extensive familiarity and expertise in the same exact procedure and  technique.

## 2021-06-21 ENCOUNTER — Telehealth: Payer: Self-pay | Admitting: *Deleted

## 2021-06-21 NOTE — Telephone Encounter (Signed)
No problems post IT pump refill. 

## 2021-06-22 ENCOUNTER — Other Ambulatory Visit: Payer: Self-pay

## 2021-06-22 ENCOUNTER — Ambulatory Visit (INDEPENDENT_AMBULATORY_CARE_PROVIDER_SITE_OTHER): Payer: Medicare Other | Admitting: Internal Medicine

## 2021-06-22 ENCOUNTER — Encounter: Payer: Self-pay | Admitting: Internal Medicine

## 2021-06-22 VITALS — BP 132/72 | HR 70 | Temp 97.5°F | Resp 17 | Ht 69.0 in | Wt 219.4 lb

## 2021-06-22 DIAGNOSIS — E1141 Type 2 diabetes mellitus with diabetic mononeuropathy: Secondary | ICD-10-CM

## 2021-06-22 DIAGNOSIS — E6609 Other obesity due to excess calories: Secondary | ICD-10-CM

## 2021-06-22 DIAGNOSIS — Z23 Encounter for immunization: Secondary | ICD-10-CM | POA: Diagnosis not present

## 2021-06-22 DIAGNOSIS — M7918 Myalgia, other site: Secondary | ICD-10-CM | POA: Diagnosis not present

## 2021-06-22 DIAGNOSIS — Z1211 Encounter for screening for malignant neoplasm of colon: Secondary | ICD-10-CM | POA: Diagnosis not present

## 2021-06-22 DIAGNOSIS — M797 Fibromyalgia: Secondary | ICD-10-CM | POA: Diagnosis not present

## 2021-06-22 DIAGNOSIS — M5441 Lumbago with sciatica, right side: Secondary | ICD-10-CM

## 2021-06-22 DIAGNOSIS — G8929 Other chronic pain: Secondary | ICD-10-CM

## 2021-06-22 DIAGNOSIS — Z6832 Body mass index (BMI) 32.0-32.9, adult: Secondary | ICD-10-CM

## 2021-06-22 MED ORDER — PREGABALIN 150 MG PO CAPS: 150.0000 mg | ORAL_CAPSULE | Freq: Three times a day (TID) | ORAL | 1 refills | Status: DC

## 2021-06-22 MED ORDER — OZEMPIC (0.25 OR 0.5 MG/DOSE) 2 MG/1.5ML ~~LOC~~ SOPN: 0.5000 mg | PEN_INJECTOR | SUBCUTANEOUS | 0 refills | Status: DC

## 2021-06-22 NOTE — Assessment & Plan Note (Signed)
Encouraged diet and exercise for weight loss ?

## 2021-06-22 NOTE — Patient Instructions (Signed)
Health Maintenance for Postmenopausal Women ?Menopause is a normal process in which your ability to get pregnant comes to an end. This process happens slowly over many months or years, usually between the ages of 48 and 55. Menopause is complete when you have missed your menstrual period for 12 months. ?It is important to talk with your health care provider about some of the most common conditions that affect women after menopause (postmenopausal women). These include heart disease, cancer, and bone loss (osteoporosis). Adopting a healthy lifestyle and getting preventive care can help to promote your health and wellness. The actions you take can also lower your chances of developing some of these common conditions. ?What are the signs and symptoms of menopause? ?During menopause, you may have the following symptoms: ?Hot flashes. These can be moderate or severe. ?Night sweats. ?Decrease in sex drive. ?Mood swings. ?Headaches. ?Tiredness (fatigue). ?Irritability. ?Memory problems. ?Problems falling asleep or staying asleep. ?Talk with your health care provider about treatment options for your symptoms. ?Do I need hormone replacement therapy? ?Hormone replacement therapy is effective in treating symptoms that are caused by menopause, such as hot flashes and night sweats. ?Hormone replacement carries certain risks, especially as you become older. If you are thinking about using estrogen or estrogen with progestin, discuss the benefits and risks with your health care provider. ?How can I reduce my risk for heart disease and stroke? ?The risk of heart disease, heart attack, and stroke increases as you age. One of the causes may be a change in the body's hormones during menopause. This can affect how your body uses dietary fats, triglycerides, and cholesterol. Heart attack and stroke are medical emergencies. There are many things that you can do to help prevent heart disease and stroke. ?Watch your blood pressure ?High  blood pressure causes heart disease and increases the risk of stroke. This is more likely to develop in people who have high blood pressure readings or are overweight. ?Have your blood pressure checked: ?Every 3-5 years if you are 18-39 years of age. ?Every year if you are 40 years old or older. ?Eat a healthy diet ? ?Eat a diet that includes plenty of vegetables, fruits, low-fat dairy products, and lean protein. ?Do not eat a lot of foods that are high in solid fats, added sugars, or sodium. ?Get regular exercise ?Get regular exercise. This is one of the most important things you can do for your health. Most adults should: ?Try to exercise for at least 150 minutes each week. The exercise should increase your heart rate and make you sweat (moderate-intensity exercise). ?Try to do strengthening exercises at least twice each week. Do these in addition to the moderate-intensity exercise. ?Spend less time sitting. Even light physical activity can be beneficial. ?Other tips ?Work with your health care provider to achieve or maintain a healthy weight. ?Do not use any products that contain nicotine or tobacco. These products include cigarettes, chewing tobacco, and vaping devices, such as e-cigarettes. If you need help quitting, ask your health care provider. ?Know your numbers. Ask your health care provider to check your cholesterol and your blood sugar (glucose). Continue to have your blood tested as directed by your health care provider. ?Do I need screening for cancer? ?Depending on your health history and family history, you may need to have cancer screenings at different stages of your life. This may include screening for: ?Breast cancer. ?Cervical cancer. ?Lung cancer. ?Colorectal cancer. ?What is my risk for osteoporosis? ?After menopause, you may be   at increased risk for osteoporosis. Osteoporosis is a condition in which bone destruction happens more quickly than new bone creation. To help prevent osteoporosis or  the bone fractures that can happen because of osteoporosis, you may take the following actions: ?If you are 19-50 years old, get at least 1,000 mg of calcium and at least 600 international units (IU) of vitamin D per day. ?If you are older than age 50 but younger than age 70, get at least 1,200 mg of calcium and at least 600 international units (IU) of vitamin D per day. ?If you are older than age 70, get at least 1,200 mg of calcium and at least 800 international units (IU) of vitamin D per day. ?Smoking and drinking excessive alcohol increase the risk of osteoporosis. Eat foods that are rich in calcium and vitamin D, and do weight-bearing exercises several times each week as directed by your health care provider. ?How does menopause affect my mental health? ?Depression may occur at any age, but it is more common as you become older. Common symptoms of depression include: ?Feeling depressed. ?Changes in sleep patterns. ?Changes in appetite or eating patterns. ?Feeling an overall lack of motivation or enjoyment of activities that you previously enjoyed. ?Frequent crying spells. ?Talk with your health care provider if you think that you are experiencing any of these symptoms. ?General instructions ?See your health care provider for regular wellness exams and vaccines. This may include: ?Scheduling regular health, dental, and eye exams. ?Getting and maintaining your vaccines. These include: ?Influenza vaccine. Get this vaccine each year before the flu season begins. ?Pneumonia vaccine. ?Shingles vaccine. ?Tetanus, diphtheria, and pertussis (Tdap) booster vaccine. ?Your health care provider may also recommend other immunizations. ?Tell your health care provider if you have ever been abused or do not feel safe at home. ?Summary ?Menopause is a normal process in which your ability to get pregnant comes to an end. ?This condition causes hot flashes, night sweats, decreased interest in sex, mood swings, headaches, or lack  of sleep. ?Treatment for this condition may include hormone replacement therapy. ?Take actions to keep yourself healthy, including exercising regularly, eating a healthy diet, watching your weight, and checking your blood pressure and blood sugar levels. ?Get screened for cancer and depression. Make sure that you are up to date with all your vaccines. ?This information is not intended to replace advice given to you by your health care provider. Make sure you discuss any questions you have with your health care provider. ?Document Revised: 11/07/2020 Document Reviewed: 11/07/2020 ?Elsevier Patient Education ? 2022 [employer] ? ?

## 2021-06-22 NOTE — Progress Notes (Signed)
Subjective:    Patient ID: Melinda [name], female    DOB: 09/04/1955, 65 y.o.   MRN: [id number]  HPI  Pt presents to the clinic today for her annual exam.  Flu: 04/2021 Tetanus: 07/2019 Covid: Pfizer x 3 Pneumovax: 07/2015 Shingrix: 03/2019 Pap smear: 08/2017 Mammogram: 04/2021 Bone density: never Colon screening: > 10 year Vision screening:annually Dentist: as needed  Diet: She does eat meat. She consumes fruits and veggies. She does eat some fried foods. She drinks mostly water. 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   Bulging disc    CAD (coronary artery disease) 2009   s/p stent to LAD   Cardiac arrhythmia due to congenital heart disease 1992   WPW.  Ablations done.  Now has rare episodes   Chicken pox    Chronic fatigue    Chronic kidney disease    "problem with kidney filtration"   Coronary arteritis    Degenerative disc disease    Back, neck, hands, knees   Depression    Diabetes mellitus without complication (HCC)    Facet joint disease    Fibromyalgia    Heart disease    Hyperlipidemia    IBS (irritable bowel syndrome)    MCL deficiency, knee    MRSA (methicillin resistant staph aureus) culture positive 2011   GREAT TOE RIGHT FOOT   Neuropathy 04/18/2010   Orthostatic hypotension 01/2020   Peripheral neuropathy    Renal insufficiency    Restless leg syndrome    Sepsis ([place]) 09/2013   Sleep apnea 08/17/2003   uses CPAP, sleep study at [facility] (mild to moderate)   Spinal stenosis     Current Outpatient Medications  Medication Sig Dispense Refill   ACCU-CHEK GUIDE test strip USE 1 EACH BY OTHER ROUTE 3 (THREE) TIMES DAILY. USE AS INSTRUCTE 200 strip 1   Accu-Chek Softclix Lancets lancets USE TO  CHECK BLOOD SUGAR 1 TIME DAILY 100 each 2   AMBULATORY NON FORMULARY MEDICATION Medication Name: CPAP MASK OF CHOICE FOR HOME DEVICE 1 each 0   Ascorbic Acid (VITAMIN C) 1000 MG tablet Take 1,000 mg by mouth daily.     aspirin 81 MG tablet Take 1 tablet (81 mg total) by mouth 2 (two) times daily after a meal. (Patient taking differently: Take 81 mg by mouth daily.) 30 tablet 0   b complex vitamins tablet Take 1 tablet by mouth in the morning.     Biotin 10000 MCG TABS Take 10,000 mcg by mouth daily.     Blood Glucose Calibration (ACCU-CHEK GUIDE CONTROL) LIQD 1 each by In Vitro route as needed. 1 each 1   buPROPion (WELLBUTRIN XL) 150 MG 24 hr tablet Take 1 tablet (150 mg total) by mouth daily. 90 tablet 1   CALCIUM-MAGNESIUM-ZINC PO Take 3 capsules by mouth daily.     Cholecalciferol (VITAMIN D) 50 MCG (2000 UT) tablet Take 2,000 Units by mouth daily.     CINNAMON PO Take 1,000 mg by mouth daily.     ezetimibe (ZETIA) 10 MG tablet TAKE 1 TABLET BY MOUTH EVERY DAY 90 tablet 1   glucosamine-chondroitin 500-400 MG tablet Take 2 tablets by mouth daily.  Insulin Pen Needle 31G X 5 MM MISC BD Pen Needles- brand specific Inject insulin via insulin pen 6 x daily 100 each 2   Magnesium 400 MG CAPS Take 400 mg by mouth at bedtime.     nitroGLYCERIN (NITROSTAT) 0.4 MG SL tablet PLACE 1 TABLET UNDER THE TONGUE EVERY 5 (FIVE) MINUTES AS NEEDED FOR CHEST PAIN. 25 tablet 4   NONFORMULARY OR COMPOUNDED ITEM 212.8 mcg by Epidural Infusion route. Medtronic Neuromodulation pump  Fentanyl 1,000.0 mcg/ml Baclofen 240.0 mcg/ml Bupivacaine 20.0 mg/ml Total daily dose 269.6 mcg/day     Omega-3 Fatty Acids (FISH OIL) 1200 MG CAPS Take 1,200 mg by mouth in the morning.      [START ON 06/28/2021] oxyCODONE-acetaminophen (PERCOCET) 5-325 MG tablet Take 1 tablet by mouth every 8 (eight) hours as needed for severe pain. Each refill must last 30 days. 90 tablet 0   [START ON 07/28/2021] oxyCODONE-acetaminophen  (PERCOCET) 5-325 MG tablet Take 1 tablet by mouth every 8 (eight) hours as needed for severe pain. Each refill must last 30 days. 90 tablet 0   [START ON 08/27/2021] oxyCODONE-acetaminophen (PERCOCET) 5-325 MG tablet Take 1 tablet by mouth every 8 (eight) hours as needed for severe pain. Each refill must last 30 days. 90 tablet 0   OZEMPIC, 0.25 OR 0.5 MG/DOSE, 2 MG/1.5ML SOPN INJECT 0.25 MG INTO THE SKIN ONCE A WEEK. FOR FIRST 4 WEEKS. THEN INCREASE DOSE TO 0.5MG  WEEKLY 1.5 mL 1   PARoxetine (PAXIL) 20 MG tablet TAKE 0.5 TABLET BY MOUTH DAILY IN THE MORNING 45 tablet 0   polyethylene glycol (MIRALAX / GLYCOLAX) packet Take 17 g by mouth daily.     pregabalin (LYRICA) 150 MG capsule TAKE 1 CAPSULE BY MOUTH 3 TIMES DAILY. 270 capsule 0   rosuvastatin (CRESTOR) 20 MG tablet TAKE 1 TABLET BY MOUTH EVERY DAY 90 tablet 0   tiZANidine (ZANAFLEX) 4 MG tablet TAKE 1 TABLET (4 MG TOTAL) BY MOUTH EVERY 8 (EIGHT) HOURS AS NEEDED FOR MUSCLE SPASMS 270 tablet 0   vitamin E 180 MG (400 UNITS) capsule Take 400 Units by mouth daily.     No current facility-administered medications for this visit.    No Known Allergies  Family History  Problem Relation Age of Onset   Cancer Mother    Lung cancer Mother    Arthritis Father    Heart disease Father    Diabetes Father    Dementia Maternal Grandmother    Alcohol abuse Paternal Grandfather    Heart disease Paternal Grandfather    Stroke Paternal Grandfather     Social History   Socioeconomic History   Marital status: Significant Other    Spouse name: Not on file   Number of children: Not on file   Years of education: Not on file   Highest education level: Not on file  Occupational History   Not on file  Tobacco Use   Smoking status: Former    Packs/day: 1.50    Years: 32.00    Pack years: 48.00    Types: Cigarettes    Quit date: 07/22/2007    Years since quitting: 13.9   Smokeless tobacco: Never  Vaping Use   Vaping Use: Never used  Substance  and Sexual Activity   Alcohol use: Yes    Comment: occ - Holidays   Drug use: Never    Comment: prescribed pain pump and oxy   Sexual activity: Yes  Other Topics Concern   Not on file  Social  History Narrative   Lives at home with a partner. Independent at baseline   Social Determinants of Health   Financial Resource Strain: Not on file  Food Insecurity: Not on file  Transportation Needs: Not on file  Physical Activity: Not on file  Stress: Not on file  Social Connections: Not on file  Intimate Partner Violence: Not on file     Constitutional: Denies fever, malaise, fatigue, headache or abrupt weight changes.  HEENT: Denies eye pain, eye redness, ear pain, ringing in the ears, wax buildup, runny nose, nasal congestion, bloody nose, or sore throat. Respiratory: Denies difficulty breathing, shortness of breath, cough or sputum production.   Cardiovascular: Denies chest pain, chest tightness, palpitations or swelling in the hands or feet.  Gastrointestinal: Pt reports intermittent constipation and diarrhea. Denies abdominal pain, bloating, or blood in the stool.  GU: Pt reports urine odor. Denies urgency, frequency, pain with urination, burning sensation, blood in urine, or discharge. Musculoskeletal: Pt reports chronic joint and muscle pain. Denies decrease in range of motion, difficulty with gait, or joint  swelling.  Skin: Denies redness, rashes, lesions or ulcercations.  Neurological: Pt reports neuropathic pain. Denies dizziness, difficulty with memory, difficulty with speech or problems with balance and coordination.  Psych: Pt has a history of anxiety and depression. Denies SI/HI.  No other specific complaints in a complete review of systems (except as listed in HPI above).  Objective:   Physical Exam  BP 132/72 (BP Location: Right Arm, Patient Position: Sitting, Cuff Size: Large)    Pulse 70    Temp (!) 97.5 F (36.4 C) (Temporal)    Resp 17    Ht 5\' 9"  (1.753 m)    Wt  219 lb 6.4 oz (99.5 kg)    LMP  (LMP Unknown)    SpO2 99%    BMI 32.40 kg/m   Wt Readings from Last 3 Encounters:  06/20/21 216 lb (98 kg)  06/02/21 216 lb 3.2 oz (98.1 kg)  04/20/21 217 lb (98.4 kg)    General: Appears her stated age, obese, in NAD. Skin: Warm, dry and intact. No ulcerations noted. HEENT: Head: normal shape and size; Eyes: sclera white and EOMs intact;  Neck:  Neck supple, trachea midline. No masses, lumps or thyromegaly present.  Cardiovascular: Normal rate and rhythm. S1,S2 noted.  No murmur, rubs or gallops noted. No JVD or BLE edema. No carotid bruits noted. Pulmonary/Chest: Normal effort and positive vesicular breath sounds. No respiratory distress. No wheezes, rales or ronchi noted.  Abdomen:Normal bowel sounds.  Musculoskeletal: Strength 5/5 BUE/BLE. No difficulty with gait.  Neurological: Alert and oriented. Cranial nerves II-XII grossly intact. Coordination normal.  Psychiatric: Mood and affect mildly flat. Behavior is normal. Judgment and thought content normal.    BMET    Component Value Date/Time   NA 139 12/21/2020 0957   NA 135 (L) 06/30/2013 1203   K 4.9 12/21/2020 0957   K 4.1 06/30/2013 1203   CL 101 12/21/2020 0957   CL 102 06/30/2013 1203   CO2 31 12/21/2020 0957   CO2 32 06/30/2013 1203   GLUCOSE 158 (H) 12/21/2020 0957   GLUCOSE 143 (H) 06/30/2013 1203   BUN 30 (H) 12/21/2020 0957   BUN 19 (H) 06/30/2013 1203   CREATININE 1.06 (H) 12/21/2020 0957   CALCIUM 9.2 12/21/2020 0957   CALCIUM 9.7 06/30/2013 1203   GFRNONAA 56 (L) 12/21/2020 0957   GFRAA 65 12/21/2020 0957    Lipid Panel     Component  Value Date/Time   CHOL 135 12/21/2020 0957   TRIG 219 (H) 12/21/2020 0957   HDL 41 (L) 12/21/2020 0957   CHOLHDL 3.3 12/21/2020 0957   VLDL 31.6 03/12/2019 1255   LDLCALC 65 12/21/2020 0957    CBC    Component Value Date/Time   WBC 6.3 09/06/2020 1330   RBC 4.42 09/06/2020 1330   HGB 12.4 09/06/2020 1330   HGB 14.0 11/12/2011  1136   HCT 38.4 09/06/2020 1330   HCT 40.7 11/12/2011 1136   PLT 199 09/06/2020 1330   PLT 274 11/12/2011 1136   MCV 86.9 09/06/2020 1330   MCV 86 11/12/2011 1136   MCH 28.1 09/06/2020 1330   MCHC 32.3 09/06/2020 1330   RDW 13.8 09/06/2020 1330   RDW 12.8 11/12/2011 1136   LYMPHSABS 1.4 09/06/2020 1330   LYMPHSABS 2.5 11/12/2011 1136   MONOABS 0.5 09/06/2020 1330   MONOABS 0.8 11/12/2011 1136   EOSABS 0.2 09/06/2020 1330   EOSABS 0.6 11/12/2011 1136   BASOSABS 0.1 09/06/2020 1330   BASOSABS 0.1 11/12/2011 1136    Hgb A1C Lab Results  Component Value Date   HGBA1C 6.4 (H) 12/21/2020           Assessment & Plan:   Preventative Health Maintenance:  Flu shot UTD Tetanus UTD Pneumovax today Encouraged her to get her covid booster Encouraged her to get her her 2nd shingrix vaccine Pap smear UTD Mammogram UTD Will hold off on bone density at this time Referral to GI for screening colonoscopy Encouraged her to consume a balanced diet and exercise regimen Advised her to see an eye doctor and dentist annually Will check CBC, CMET, Lipid and A1C today  RTC in 6 months follow up chronic conditions [name], NP This visit occurred during the SARS-CoV-2 public health emergency.  Safety protocols were in place, including screening questions prior to the visit, additional usage of staff PPE, and extensive cleaning of exam room while observing appropriate contact time as indicated for disinfecting solutions.

## 2021-06-23 LAB — LIPID PANEL
Cholesterol: 98 mg/dL (ref <200)
HDL: 39 mg/dL — ABNORMAL LOW (ref >=50)
LDL Cholesterol (Calc): 39 mg/dL (calc)
Non-HDL Cholesterol (Calc): 59 mg/dL (calc) (ref <130)
Total CHOL/HDL Ratio: 2.5 (calc) (ref <5.0)
Triglycerides: 114 mg/dL (ref <150)

## 2021-06-23 LAB — COMPLETE METABOLIC PANEL WITH GFR
AG Ratio: 1.7 (calc) (ref 1.0–2.5)
ALT: 25 U/L (ref 6–29)
AST: 25 U/L (ref 10–35)
Albumin: 4.4 g/dL (ref 3.6–5.1)
Alkaline phosphatase (APISO): 102 U/L (ref 37–153)
BUN: 24 mg/dL (ref 7–25)
CO2: 31 mmol/L (ref 20–32)
Calcium: 9.1 mg/dL (ref 8.6–10.4)
Chloride: 104 mmol/L (ref 98–110)
Creat: 1.04 mg/dL (ref 0.50–1.05)
Globulin: 2.6 g/dL (calc) (ref 1.9–3.7)
Glucose, Bld: 85 mg/dL (ref 65–139)
Potassium: 4.3 mmol/L (ref 3.5–5.3)
Sodium: 142 mmol/L (ref 135–146)
Total Bilirubin: 0.4 mg/dL (ref 0.2–1.2)
Total Protein: 7 g/dL (ref 6.1–8.1)
eGFR: 60 mL/min/{1.73_m2} (ref >=60)

## 2021-06-23 LAB — CBC
HCT: 38.3 % (ref 35.0–45.0)
Hemoglobin: 13.1 g/dL (ref 11.7–15.5)
MCH: 29.9 pg (ref 27.0–33.0)
MCHC: 34.2 g/dL (ref 32.0–36.0)
MCV: 87.4 fL (ref 80.0–100.0)
MPV: 9 fL (ref 7.5–12.5)
Platelets: 216 10*3/uL (ref 140–400)
RBC: 4.38 10*6/uL (ref 3.80–5.10)
RDW: 12.2 % (ref 11.0–15.0)
WBC: 6.9 10*3/uL (ref 3.8–10.8)

## 2021-06-23 LAB — HEMOGLOBIN A1C
Hgb A1c MFr Bld: 5.6 % of total Hgb (ref <5.7)
Mean Plasma Glucose: 114 mg/dL
eAG (mmol/L): 6.3 mmol/L

## 2021-06-26 LAB — CANNABIDIOL (CBD)/TETRAHYDROCANNABINOL (THC) RATIO, URINE: CBD:THC Ratio: <1 RATIO

## 2021-06-28 MED FILL — Medication: INTRATHECAL | Qty: 1 | Status: AC

## 2021-06-29 LAB — TOXASSURE SELECT 13 (MW), URINE

## 2021-07-04 DIAGNOSIS — H5213 Myopia, bilateral: Secondary | ICD-10-CM | POA: Diagnosis not present

## 2021-07-04 DIAGNOSIS — E119 Type 2 diabetes mellitus without complications: Secondary | ICD-10-CM | POA: Diagnosis not present

## 2021-07-12 ENCOUNTER — Other Ambulatory Visit: Payer: Self-pay

## 2021-07-12 ENCOUNTER — Encounter: Payer: Self-pay | Admitting: Cardiology

## 2021-07-12 ENCOUNTER — Ambulatory Visit (INDEPENDENT_AMBULATORY_CARE_PROVIDER_SITE_OTHER): Payer: Medicare Other | Admitting: Cardiology

## 2021-07-12 DIAGNOSIS — F1190 Opioid use, unspecified, uncomplicated: Secondary | ICD-10-CM

## 2021-07-12 DIAGNOSIS — I2510 Atherosclerotic heart disease of native coronary artery without angina pectoris: Secondary | ICD-10-CM | POA: Diagnosis not present

## 2021-07-12 DIAGNOSIS — I951 Orthostatic hypotension: Secondary | ICD-10-CM | POA: Diagnosis not present

## 2021-07-12 MED ORDER — MIDODRINE HCL 2.5 MG PO TABS: 2.5000 mg | ORAL_TABLET | Freq: Three times a day (TID) | ORAL | 6 refills | Status: DC

## 2021-07-12 NOTE — Patient Instructions (Signed)
Medication Instructions:  Please restart Midodrine 2.5 mg three times a day. Keep a check on your blood pressures.  Continue all other medications as listed.  *If you need a refill on your cardiac medications before your next appointment, please call your pharmacy*  Follow-Up: At [facility], you and your health needs are our priority.  As part of our continuing mission to provide you with exceptional heart care, we have created designated Provider Care Teams.  These Care Teams include your primary Cardiologist (physician) and Advanced Practice Providers (APPs -  Physician Assistants and Nurse Practitioners) who all work together to provide you with the care you need, when you need it.  We recommend signing up for the patient portal called "MyChart".  Sign up information is provided on this After Visit Summary.  MyChart is used to connect with patients for Virtual Visits (Telemedicine).  Patients are able to view lab/test results, encounter notes, upcoming appointments, etc.  Non-urgent messages can be sent to your provider as well.   To learn more about what you can do with MyChart, go to [email or web address].    Your next appointment:   3 month(s)  The format for your next appointment:   In Person  Provider:   Cecilie Kicks, NP, Ermalinda Barrios, PA-C, Christen Bame, NP, or Richardson Dopp, PA-C         Thank you for choosing [facility]!!

## 2021-07-12 NOTE — Progress Notes (Signed)
Cardiology Office Note:    Date:  07/12/2021   ID:  Melinda [name], DOB 1955/09/23, MRN [id number]  PCP:  Jearld Fenton, NP  [facility] Cardiologist:  Candee Furbish, MD  [facility] [place] Electrophysiologist:  None   Referring MD: Jearld Fenton, NP    History of Present Illness:    Melinda [name] is a 66 y.o. female here for follow-up of orthostatic hypotension.  Her low blood pressure seems to have returned.  At times her blood pressure can be extremely low at 1 point was placed on midodrine.  Multiple near syncopal episodes.  Systolics have been in the 57s.  Overall she had been doing very well.  In fact she is off of the midodrine.  She did have a toe infection, treated effectively.  She is feeling much better now.  She had some hypotension surrounding this infection.  Blood pressure seem to be very well controlled currently.  No anginal symptoms.  She is having knee pain.  Since her last visit, patient has had a revision right knee arthroplasty on 09/06/2020. She is also being seen by [name] for chronic pain syndrome and is on 5-325 mg percocet.  Today, her blood pressure is low at this visit. BP Readings from Last 3 Encounters:  07/12/21 (!) 80/50  06/22/21 132/72  06/20/21 115/71    She reports that while driving from home, she was okay but getting into the office, she started feeling dizzy and is aware that her blood pressure is lower. She admits she is dehydrated this morning. Blood pressure readings at home have been stable. Was checked again and still in the 80's.   She has been using ibuprofen to manage the pain in her left leg and will be seeing the orthopedic surgeon soon.   She reports that recently, she has been feeling skips in her chest that are not brought on by anything. She says they are scary but only last for a few seconds and is aware when they happen.  She recently started using ozempic and has lost a few pounds.  She denies chest pain, shortness  of breath, palpitations,  headaches, syncope, LE edema, orthopnea, PND.    Past Medical History:  Diagnosis Date   Amputation of great toe, right, traumatic ([place]) 05/30/2010   Amputation of second toe, right, traumatic ([place]) 09/2017   Anginal pain (HCC)    Anxiety    Blue toes    2nd toe on right foot, will get appt.   Bulging disc    CAD (coronary artery disease) 2009   s/p stent to LAD   Cardiac arrhythmia due to congenital heart disease 1992   WPW.  Ablations done.  Now has rare episodes   Chicken pox    Chronic fatigue    Chronic kidney disease    "problem with kidney filtration"   Coronary arteritis    Degenerative disc disease    Back, neck, hands, knees   Depression    Diabetes mellitus without complication (HCC)    Facet joint disease    Fibromyalgia    Heart disease    Hyperlipidemia    IBS (irritable bowel syndrome)    MCL deficiency, knee    MRSA (methicillin resistant staph aureus) culture positive 2011   GREAT TOE RIGHT FOOT   Neuropathy 04/18/2010   Orthostatic hypotension 01/2020   Peripheral neuropathy    Renal insufficiency    Restless leg syndrome    Sepsis ([place]) 09/2013   Sleep apnea  08/17/2003   uses CPAP, sleep study at [facility] (mild to moderate)   Spinal stenosis     Past Surgical History:  Procedure Laterality Date   ABLATION     UTERUS   ABLATION     HEART   AMPUTATION TOE Right 02/01/2017   Procedure: AMPUTATION TOE-RIGHT 2ND MPJ;  Surgeon: Albertine Patricia, DPM;  Location: [facility];  Service: Podiatry;  Laterality: Right;   ANTERIOR CERVICAL DECOMP/DISCECTOMY FUSION N/A 07/28/2014   Procedure: ANTERIOR CERVICAL DECOMPRESSION/DISCECTOMY FUSION CERVICAL 3-4,4-5,5-6 LEVELS WITH INSTRUMENTATION AND ALLOGRAFT;  Surgeon: Sinclair Ship, MD;  Location: [place];  Service: Orthopedics;  Laterality: N/A;  Anterior cervical decompression fusion, cervical 3-4, cervical 4-5, cervical 5-6 with instrumentation and allograft   BACK  SURGERY     X 3 1979, 1994, 1995   CARPAL TUNNEL RELEASE Right    CHOLECYSTECTOMY  2003   CORONARY ANGIOPLASTY  2008   EYE SURGERY Bilateral 2013   Eyelid lift    FOOT SURGERY Right    BIG TOE   FOOT SURGERY Bilateral    PLANTAR FASCIITIS   FOOT SURGERY Right    2ND TOE   GALLBLADDER SURGERY     HAMMER TOE SURGERY Right 10/17/2017   Procedure: HAMMER TOE CORRECTION-4TH TOE;  Surgeon: Albertine Patricia, DPM;  Location: [place];  Service: Podiatry;  Laterality: Right;  LMA- WITH LOCAL Diabetic - diet controlled   HAND SURGERY Left    HAND SURGEY Left    HEART STENT  2009   LAD   INFUSION PUMP IMPLANTATION     X2 with morphine and baclofen   INTRATHECAL PUMP REVISION N/A 04/25/2018   Procedure: Intrathecal pump replacement;  Surgeon: Clydell Hakim, MD;  Location: [place];  Service: Neurosurgery;  Laterality: N/A;  right   INTRATHECAL PUMP REVISION Right 04/25/2018   INTRATHECAL PUMP REVISION N/A 12/12/2018   Procedure: Intrathecal pump revision with exploration of pocket;  Surgeon: Clydell Hakim, MD;  Location: [place];  Service: Neurosurgery;  Laterality: N/A;  Intrathecal pump revision with exploration of pocket   INTRATHECAL PUMP REVISION N/A 03/11/2020   Procedure: INTRATHECAL PUMP REVISION;  Surgeon: [name], MD;  Location: [facility];  Service: Neurosurgery;  Laterality: N/A;   IRRIGATION AND DEBRIDEMENT FOOT Right 02/23/2017   Procedure: IRRIGATION AND DEBRIDEMENT FOOT-right foot;  Surgeon: Samara Deist, DPM;  Location: [facility];  Service: Podiatry;  Laterality: Right;   KNEE SURGERY Right 05/24/2020   PAIN PUMP REVISION N/A 08/15/2018   Procedure: Intrathecal PUMP REVISION;  Surgeon: Clydell Hakim, MD;  Location: [place];  Service: Neurosurgery;  Laterality: N/A;  INTRATHECAL PUMP REVISION   TOE SURGERY     then revision 8/18   TOTAL KNEE ARTHROPLASTY Right 05/24/2020   Procedure: RIGHT TOTAL KNEE ARTHROPLASTY;  Surgeon: Melrose Nakayama, MD;  [facility];  Service: Orthopedics;  Laterality: Right;   TOTAL KNEE REVISION Right 09/06/2020   : RIGHT KNEE POLY REVISION;  Rhona Raider, Peter, MD) PLAN TO DISCHARGE FROM PACU    Current Medications: Current Meds  Medication Sig   ACCU-CHEK GUIDE test strip USE 1 EACH BY OTHER ROUTE 3 (THREE) TIMES DAILY. USE AS INSTRUCTE   Accu-Chek Softclix Lancets lancets USE TO CHECK BLOOD SUGAR 1 TIME DAILY   AMBULATORY NON FORMULARY MEDICATION Medication Name: CPAP MASK OF CHOICE FOR HOME DEVICE   Ascorbic Acid (VITAMIN C) 1000 MG tablet Take 1,000 mg by mouth daily.   ASPIRIN 81 PO Take 81 mg by mouth daily.  b complex vitamins tablet Take 1 tablet by mouth in the morning.   Biotin 10000 MCG TABS Take 10,000 mcg by mouth daily.   Blood Glucose Calibration (ACCU-CHEK GUIDE CONTROL) LIQD 1 each by In Vitro route as needed.   buPROPion (WELLBUTRIN XL) 150 MG 24 hr tablet Take 1 tablet (150 mg total) by mouth daily.   CALCIUM-MAGNESIUM-ZINC PO Take 3 capsules by mouth daily.   Cholecalciferol (VITAMIN D) 50 MCG (2000 UT) tablet Take 2,000 Units by mouth daily.   CINNAMON PO Take 1,000 mg by mouth daily.   ezetimibe (ZETIA) 10 MG tablet TAKE 1 TABLET BY MOUTH EVERY DAY   glucosamine-chondroitin 500-400 MG tablet Take 2 tablets by mouth daily.    Insulin Pen Needle 31G X 5 MM MISC BD Pen Needles- brand specific Inject insulin via insulin pen 6 x daily   Magnesium 400 MG CAPS Take 400 mg by mouth at bedtime.   midodrine (PROAMATINE) 2.5 MG tablet Take 1 tablet (2.5 mg total) by mouth 3 (three) times daily with meals.   nitroGLYCERIN (NITROSTAT) 0.4 MG SL tablet PLACE 1 TABLET UNDER THE TONGUE EVERY 5 (FIVE) MINUTES AS NEEDED FOR CHEST PAIN.   NONFORMULARY OR COMPOUNDED ITEM 212.8 mcg by Epidural Infusion route. Medtronic Neuromodulation pump  Fentanyl 1,000.0 mcg/ml Baclofen 240.0 mcg/ml Bupivacaine 20.0 mg/ml Total daily dose 269.6 mcg/day   Omega-3 Fatty Acids (FISH OIL) 1200 MG CAPS Take 1,200 mg by mouth  in the morning.    oxyCODONE-acetaminophen (PERCOCET) 5-325 MG tablet Take 1 tablet by mouth every 8 (eight) hours as needed for severe pain. Each refill must last 30 days.   [START ON 07/28/2021] oxyCODONE-acetaminophen (PERCOCET) 5-325 MG tablet Take 1 tablet by mouth every 8 (eight) hours as needed for severe pain. Each refill must last 30 days.   [START ON 08/27/2021] oxyCODONE-acetaminophen (PERCOCET) 5-325 MG tablet Take 1 tablet by mouth every 8 (eight) hours as needed for severe pain. Each refill must last 30 days.   OZEMPIC, 0.25 OR 0.5 MG/DOSE, 2 MG/1.5ML SOPN Inject 0.5 mg into the skin once a week. For first 4 weeks. Then increase dose to 0.5mg  weekly   PARoxetine (PAXIL) 20 MG tablet TAKE 0.5 TABLET BY MOUTH DAILY IN THE MORNING   polyethylene glycol (MIRALAX / GLYCOLAX) packet Take 17 g by mouth daily.   pregabalin (LYRICA) 150 MG capsule Take 1 capsule (150 mg total) by mouth 3 (three) times daily.   rosuvastatin (CRESTOR) 20 MG tablet TAKE 1 TABLET BY MOUTH EVERY DAY   tiZANidine (ZANAFLEX) 4 MG tablet TAKE 1 TABLET (4 MG TOTAL) BY MOUTH EVERY 8 (EIGHT) HOURS AS NEEDED FOR MUSCLE SPASMS   vitamin E 180 MG (400 UNITS) capsule Take 400 Units by mouth daily.     Allergies:   Patient has no known allergies.   Social History   Socioeconomic History   Marital status: Significant Other    Spouse name: Not on file   Number of children: Not on file   Years of education: Not on file   Highest education level: Not on file  Occupational History   Not on file  Tobacco Use   Smoking status: Former    Packs/day: 1.50    Years: 32.00    Pack years: 48.00    Types: Cigarettes    Quit date: 07/22/2007    Years since quitting: 13.9   Smokeless tobacco: Never  Vaping Use   Vaping Use: Never used  Substance and Sexual Activity  Alcohol use: Yes    Comment: occ - Holidays   Drug use: Never    Comment: prescribed pain pump and oxy   Sexual activity: Yes  Other Topics Concern   Not  on file  Social History Narrative   Lives at home with a partner. Independent at baseline   Social Determinants of Health   Financial Resource Strain: Not on file  Food Insecurity: Not on file  Transportation Needs: Not on file  Physical Activity: Not on file  Stress: Not on file  Social Connections: Not on file     Family History: The patient's family history includes Alcohol abuse in her paternal grandfather; Arthritis in her father; Cancer in her mother; Dementia in her maternal grandmother; Diabetes in her father; Heart disease in her father and paternal grandfather; Lung cancer in her mother; Stroke in her paternal grandfather.  ROS:   Please see the history of present illness.    (+) dizziness  All other systems reviewed and are negative.  EKGs/Labs/Other Studies Reviewed:    EKG  07/12/2021: sinus rhythm rate- 66 bpm  Recent Labs: 06/22/2021: ALT 25; BUN 24; Creat 1.04; Hemoglobin 13.1; Platelets 216; Potassium 4.3; Sodium 142  Recent Lipid Panel    Component Value Date/Time   CHOL 98 06/22/2021 1450   TRIG 114 06/22/2021 1450   HDL 39 (L) 06/22/2021 1450   CHOLHDL 2.5 06/22/2021 1450   VLDL 31.6 03/12/2019 1255   LDLCALC 39 06/22/2021 1450   LDLDIRECT 63.0 11/04/2017 1326    Physical Exam:    VS:  BP (!) 80/50 (BP Location: Left Arm, Patient Position: Sitting, Cuff Size: Normal)    Pulse 66    Ht 5\' 9"  (1.753 m)    Wt 216 lb (98 kg)    LMP  (LMP Unknown)    SpO2 93%    BMI 31.90 kg/m     Wt Readings from Last 3 Encounters:  07/12/21 216 lb (98 kg)  06/22/21 219 lb 6.4 oz (99.5 kg)  06/20/21 216 lb (98 kg)     GEN:  Well nourished, well developed in no acute distress HEENT: Normal NECK: No JVD; No carotid bruits LYMPHATICS: No lymphadenopathy CARDIAC: RRR, no murmurs, rubs, gallops RESPIRATORY:  Clear to auscultation without rales, wheezing or rhonchi  ABDOMEN: Soft, non-tender, non-distended, pain pump incision noted. MUSCULOSKELETAL:  No edema; No  deformity  SKIN: Warm and dry NEUROLOGIC:  Alert and oriented x 3 PSYCHIATRIC:  Normal affect   ASSESSMENT:    1. Orthostatic hypotension   2. Coronary artery disease involving native coronary artery of native heart without angina pectoris   3. Opiate use (22.5 MME/Day) (oral)     PLAN:    In order of problems listed above:  Hypotension She has had this issue in the past.  Blood pressures currently in the 80s.  She is symptomatic with this.  No syncope.  We are giving her some fluids here in the clinic.  Previously we had stopped her midodrine.  I think that she should probably utilize until her blood pressures are confidently in the normal range.  CAD (coronary artery disease) No anginal symptoms.  Continue with aspirin, Crestor, Zetia.  DES to LAD in 2009 2012 patent stent  Opiate use (22.5 MME/Day) (oral) Previously has had pain pump revised.  Blood pressure may be affected by this.   After hydration here in the office, saltine crackers, she felt better.  No dizziness upon standing.  45 minutes spent with patient,  review of data, monitoring of hypotension, hydration  Follow-up in 3 months with an APP.  Medication Adjustments/Labs and Tests Ordered: Current medicines are reviewed at length with the patient today.  Concerns regarding medicines are outlined above.  Orders Placed This Encounter  Procedures   EKG 12-Lead   Meds ordered this encounter  Medications   midodrine (PROAMATINE) 2.5 MG tablet    Sig: Take 1 tablet (2.5 mg total) by mouth 3 (three) times daily with meals.    Dispense:  90 tablet    Refill:  6    Patient Instructions  Medication Instructions:  Please restart Midodrine 2.5 mg three times a day. Keep a check on your blood pressures.  Continue all other medications as listed.  *If you need a refill on your cardiac medications before your next appointment, please call your pharmacy*  Follow-Up: At [facility], you and your health needs are  our priority.  As part of our continuing mission to provide you with exceptional heart care, we have created designated Provider Care Teams.  These Care Teams include your primary Cardiologist (physician) and Advanced Practice Providers (APPs -  Physician Assistants and Nurse Practitioners) who all work together to provide you with the care you need, when you need it.  We recommend signing up for the patient portal called "MyChart".  Sign up information is provided on this After Visit Summary.  MyChart is used to connect with patients for Virtual Visits (Telemedicine).  Patients are able to view lab/test results, encounter notes, upcoming appointments, etc.  Non-urgent messages can be sent to your provider as well.   To learn more about what you can do with MyChart, go to [email or web address].    Your next appointment:   3 month(s)  The format for your next appointment:   In Person  Provider:   Cecilie Kicks, NP, Ermalinda Barrios, PA-C, Christen Bame, NP, or Richardson Dopp, PA-C         Thank you for choosing [facility]!!       I,Zite Okoli,acting as a scribe for Candee Furbish, MD.,have documented all relevant documentation on the behalf of Candee Furbish, MD,as directed by  Candee Furbish, MD while in the presence of Candee Furbish, MD.   I, Candee Furbish, MD, have reviewed all documentation for this visit. The documentation on 07/12/21 for the exam, diagnosis, procedures, and orders are all accurate and complete.   Signed, Candee Furbish, MD  07/12/2021 11:36 AM    [place]

## 2021-07-12 NOTE — Assessment & Plan Note (Signed)
No anginal symptoms.  Continue with aspirin, Crestor, Zetia.  DES to LAD in 2009 2012 patent stent

## 2021-07-12 NOTE — Assessment & Plan Note (Signed)
Previously has had pain pump revised.  Blood pressure may be affected by this.

## 2021-07-12 NOTE — Assessment & Plan Note (Signed)
She has had this issue in the past.  Blood pressures currently in the 80s.  She is symptomatic with this.  No syncope.  We are giving her some fluids here in the clinic.  Previously we had stopped her midodrine.  I think that she should probably utilize until her blood pressures are confidently in the normal range.

## 2021-07-14 ENCOUNTER — Telehealth: Payer: Self-pay | Admitting: Internal Medicine

## 2021-07-14 ENCOUNTER — Ambulatory Visit: Payer: Self-pay

## 2021-07-14 DIAGNOSIS — U071 COVID-19: Secondary | ICD-10-CM

## 2021-07-14 NOTE — Telephone Encounter (Signed)
This is something she will need to be seen for. She can do an evisit through her mychart with another [facility] provider

## 2021-07-14 NOTE — Telephone Encounter (Signed)
Patient called in asking if meds can be sent to pharmacy for covid she has to  [place], [place] Phone:  [phone]  Fax:  [phone]

## 2021-07-14 NOTE — Telephone Encounter (Signed)
° °  Chief Complaint: COVID positive - home test Symptoms: Cough, chest tightness, body aches, sore throat, fever Frequency: Started yesterday Pertinent Negatives: Patient denies shortness of breath Disposition: [] ED /[] Urgent Care (no appt availability in office) / [] Appointment(In office/virtual)/ []  [facility]/ [] Home Care/ [] Refused Recommended Disposition /[]  Mobile Bus/ []  Follow-up with PCP Additional Notes: Asking for anti-viral and cough medication,no availability today for virtual visit. Please advise pt.   Answer Assessment - Initial Assessment Questions 1. COVID-19 DIAGNOSIS: "Who made your COVID-19 diagnosis?" "Was it confirmed by a positive lab test or self-test?" If not diagnosed by a doctor (or NP/PA), ask "Are there lots of cases (community spread) where you live?" Note: See public health department website, if unsure.     Home test today 2. COVID-19 EXPOSURE: "Was there any known exposure to COVID before the symptoms began?" CDC Definition of close contact: within 6 feet (2 meters) for a total of 15 minutes or more over a 24-hour period.      Yes 3. ONSET: "When did the COVID-19 symptoms start?"      Yesterday 4. WORST SYMPTOM: "What is your worst symptom?" (e.g., cough, fever, shortness of breath, muscle aches)     Body aches 5. COUGH: "Do you have a cough?" If Yes, ask: "How bad is the cough?"       Yes 6. FEVER: "Do you have a fever?" If Yes, ask: "What is your temperature, how was it measured, and when did it start?"     100.8 7. RESPIRATORY STATUS: "Describe your breathing?" (e.g., shortness of breath, wheezing, unable to speak)      Chest tightness 8. BETTER-SAME-WORSE: "Are you getting better, staying the same or getting worse compared to yesterday?"  If getting worse, ask, "In what way?"     Same 9. HIGH RISK DISEASE: "Do you have any chronic medical problems?" (e.g., asthma, heart or lung disease, weak immune system, obesity, etc.)      CAD 10. VACCINE: "Have you had the COVID-19 vaccine?" If Yes, ask: "Which one, how many shots, when did you get it?"       Yes 11. BOOSTER: "Have you received your COVID-19 booster?" If Yes, ask: "Which one and when did you get it?"       Yes 12. PREGNANCY: "Is there any chance you are pregnant?" "When was your last menstrual period?"       No 13. OTHER SYMPTOMS: "Do you have any other symptoms?"  (e.g., chills, fatigue, headache, loss of smell or taste, muscle pain, sore throat)       Fever,scratchy throat 14. O2 SATURATION MONITOR:  "Do you use an oxygen saturation monitor (pulse oximeter) at home?" If Yes, ask "What is your reading (oxygen level) today?" "What is your usual oxygen saturation reading?" (e.g., 95%)       No  Protocols used: Coronavirus (COVID-19) Diagnosed or Suspected-A-AH

## 2021-07-15 MED ORDER — NIRMATRELVIR/RITONAVIR (PAXLOVID)TABLET: 3.0000 | ORAL_TABLET | Freq: Two times a day (BID) | ORAL | 0 refills | Status: AC

## 2021-07-15 NOTE — Telephone Encounter (Signed)
Received after hours call 07/15/21 1115am patient requesting follow up on the paxlovid. Reviewed chart she called yesterday Friday 1/13 with positive covid, there was no work in [employer] and she was advised to do [employer] for rx, but it appears she did not get the message and called today.  I reviewed her chart, and her Creatinine GFR is appropriate. I will agree to rx this time, asked the on call nurse to inform patient to use the E Visits in future if after hours or weekend if need.  Also she should HOLD Rosuvastatin for 5 days while on med, and LIMIT Oral Oxycodone due to inc concentration of opiate while on paxlovid. She may also have intrathecal pump for pain, I advised that she be told to be cautious with this and hold orals if has the pump, or limit dosing if possible for opiates while on paxlovid.  Nobie Putnam, [facility] 07/15/2021, 11:28 AM

## 2021-07-17 NOTE — Telephone Encounter (Signed)
Anti-viral was called in for the patient on Friday after 5pm by Dr. Raliegh Ip.

## 2021-07-20 ENCOUNTER — Other Ambulatory Visit: Payer: Self-pay | Admitting: Internal Medicine

## 2021-07-20 ENCOUNTER — Other Ambulatory Visit: Payer: Self-pay

## 2021-07-20 MED ORDER — PAIN MANAGEMENT IT PUMP REFILL: 1.0000 | Freq: Once | INTRATHECAL | 0 refills | Status: AC

## 2021-07-20 NOTE — Telephone Encounter (Signed)
Requested Prescriptions  Pending Prescriptions Disp Refills   PARoxetine (PAXIL) 20 MG tablet [Pharmacy Med Name: PAROXETINE HCL 20 MG TABLET] 45 tablet 0    Sig: TAKE 1/2 TABLET BY MOUTH DAILY IN THE MORNING     Psychiatry:  Antidepressants - SSRI Passed - 07/20/2021  1:30 PM      Passed - Completed PHQ-2 or PHQ-9 in the last 360 days      Passed - Valid encounter within last 6 months    Recent Outpatient Visits          4 weeks ago Type 2 diabetes mellitus with diabetic mononeuropathy, without long-term current use of insulin [facility])   [facility] [place], Coralie Keens, NP   1 month ago Stuffy and runny nose   [facility] [place], [place] W, NP   7 months ago Type 2 diabetes mellitus with diabetic mononeuropathy, without long-term current use of insulin [facility])   [facility], Coralie Keens, NP      Future Appointments            In 2 months Richardson Dopp T, PA-C [place], [facility]

## 2021-07-22 ENCOUNTER — Other Ambulatory Visit: Payer: Self-pay | Admitting: Internal Medicine

## 2021-07-22 DIAGNOSIS — G8929 Other chronic pain: Secondary | ICD-10-CM

## 2021-07-22 NOTE — Telephone Encounter (Signed)
Requested medication (s) are due for refill today: yes  Requested medication (s) are on the active medication list: yes  Last refill:  04/24/21    expired 07/23/21  Future visit scheduled: no  Notes to clinic:  med not delegated to NT to RF    Requested Prescriptions  Pending Prescriptions Disp Refills   tiZANidine (ZANAFLEX) 4 MG tablet [Pharmacy Med Name: TIZANIDINE HCL 4 MG TABLET] 270 tablet 0    Sig: TAKE 1 TABLET (4 MG TOTAL) BY MOUTH EVERY 8 (EIGHT) HOURS AS NEEDED FOR MUSCLE SPASMS     Not Delegated - Cardiovascular:  Alpha-2 Agonists - tizanidine Failed - 07/22/2021 11:54 AM      Failed - This refill cannot be delegated      Passed - Valid encounter within last 6 months    Recent Outpatient Visits           1 month ago Type 2 diabetes mellitus with diabetic mononeuropathy, without long-term current use of insulin ([place])   [facility] [place], Coralie Keens, NP   1 month ago Stuffy and runny nose   [facility] [place], [place] W, NP   7 months ago Type 2 diabetes mellitus with diabetic mononeuropathy, without long-term current use of insulin [facility])   [facility] [place], Coralie Keens, NP       Future Appointments             In 2 months Richardson Dopp T, PA-C [place], [facility]

## 2021-07-26 ENCOUNTER — Encounter: Payer: Medicare Other | Admitting: Pain Medicine

## 2021-07-28 DIAGNOSIS — M25562 Pain in left knee: Secondary | ICD-10-CM | POA: Diagnosis not present

## 2021-08-07 ENCOUNTER — Encounter: Payer: Self-pay | Admitting: Cardiology

## 2021-08-08 MED ORDER — MIDODRINE HCL 5 MG PO TABS: 5.0000 mg | ORAL_TABLET | Freq: Three times a day (TID) | ORAL | 2 refills | Status: DC

## 2021-08-14 ENCOUNTER — Other Ambulatory Visit: Payer: Self-pay | Admitting: Internal Medicine

## 2021-08-14 ENCOUNTER — Encounter: Payer: Self-pay | Admitting: Cardiology

## 2021-08-14 ENCOUNTER — Encounter: Payer: Self-pay | Admitting: Internal Medicine

## 2021-08-14 DIAGNOSIS — E119 Type 2 diabetes mellitus without complications: Secondary | ICD-10-CM | POA: Diagnosis not present

## 2021-08-14 MED ORDER — BUPROPION HCL ER (XL) 150 MG PO TB24: 150.0000 mg | ORAL_TABLET | Freq: Every day | ORAL | 1 refills | Status: DC

## 2021-08-15 NOTE — Telephone Encounter (Signed)
Duplicate request. Requested Prescriptions  Pending Prescriptions Disp Refills   buPROPion (WELLBUTRIN XL) 150 MG 24 hr tablet [Pharmacy Med Name: BUPROPION HCL XL 150 MG TABLET] 90 tablet 1    Sig: TAKE 1 TABLET BY MOUTH EVERY DAY     Psychiatry: Antidepressants - bupropion Failed - 08/14/2021  2:01 AM      Failed - Last BP in normal range    BP Readings from Last 1 Encounters:  07/12/21 (!) 80/50         Passed - Cr in normal range and within 360 days    Creat  Date Value Ref Range Status  06/22/2021 1.04 0.50 - 1.05 mg/dL Final   Creatinine,U  Date Value Ref Range Status  03/12/2019 131.0 mg/dL Final   Creatinine, Urine  Date Value Ref Range Status  12/21/2020 137 20 - 275 mg/dL Final         Passed - AST in normal range and within 360 days    AST  Date Value Ref Range Status  06/22/2021 25 10 - 35 U/L Final         Passed - ALT in normal range and within 360 days    ALT  Date Value Ref Range Status  06/22/2021 25 6 - 29 U/L Final         Passed - Completed PHQ-2 or PHQ-9 in the last 360 days      Passed - Valid encounter within last 6 months    Recent Outpatient Visits          1 month ago Type 2 diabetes mellitus with diabetic mononeuropathy, without long-term current use of insulin ([place])   [facility] [place], Coralie Keens, NP   2 months ago Stuffy and runny nose   [facility] [place], [place] W, NP   7 months ago Type 2 diabetes mellitus with diabetic mononeuropathy, without long-term current use of insulin [facility])   [facility] [place], Coralie Keens, NP      Future Appointments            In 1 month Richardson Dopp T, PA-C [place], [facility]

## 2021-08-15 NOTE — Telephone Encounter (Signed)
Med refused due to Duplicate request.  Requested Prescriptions  Pending Prescriptions Disp Refills   buPROPion (WELLBUTRIN XL) 150 MG 24 hr tablet [Pharmacy Med Name: BUPROPION HCL XL 150 MG TABLET] 90 tablet 1    Sig: TAKE 1 TABLET BY MOUTH EVERY DAY     Psychiatry: Antidepressants - bupropion Failed - 08/14/2021  2:01 AM      Failed - Last BP in normal range    BP Readings from Last 1 Encounters:  07/12/21 (!) 80/50          Passed - Cr in normal range and within 360 days    Creat  Date Value Ref Range Status  06/22/2021 1.04 0.50 - 1.05 mg/dL Final   Creatinine,U  Date Value Ref Range Status  03/12/2019 131.0 mg/dL Final   Creatinine, Urine  Date Value Ref Range Status  12/21/2020 137 20 - 275 mg/dL Final          Passed - AST in normal range and within 360 days    AST  Date Value Ref Range Status  06/22/2021 25 10 - 35 U/L Final          Passed - ALT in normal range and within 360 days    ALT  Date Value Ref Range Status  06/22/2021 25 6 - 29 U/L Final          Passed - Completed PHQ-2 or PHQ-9 in the last 360 days      Passed - Valid encounter within last 6 months    Recent Outpatient Visits           1 month ago Type 2 diabetes mellitus with diabetic mononeuropathy, without long-term current use of insulin ([place])   [facility] [place], Coralie Keens, NP   2 months ago Stuffy and runny nose   [facility] [place], [place] W, NP   7 months ago Type 2 diabetes mellitus with diabetic mononeuropathy, without long-term current use of insulin [facility])   [facility] [place], Coralie Keens, NP       Future Appointments             In 1 month Richardson Dopp T, PA-C [place], [facility]

## 2021-08-17 ENCOUNTER — Ambulatory Visit: Payer: Medicare Other | Attending: Pain Medicine | Admitting: Pain Medicine

## 2021-08-17 ENCOUNTER — Other Ambulatory Visit: Payer: Self-pay

## 2021-08-17 ENCOUNTER — Telehealth: Payer: Self-pay

## 2021-08-17 ENCOUNTER — Encounter: Payer: Self-pay | Admitting: Pain Medicine

## 2021-08-17 VITALS — BP 105/67 | HR 80 | Temp 97.3°F | Resp 18 | Ht 69.0 in | Wt 205.0 lb

## 2021-08-17 DIAGNOSIS — F1190 Opioid use, unspecified, uncomplicated: Secondary | ICD-10-CM | POA: Diagnosis present

## 2021-08-17 DIAGNOSIS — G8929 Other chronic pain: Secondary | ICD-10-CM | POA: Diagnosis not present

## 2021-08-17 DIAGNOSIS — M5137 Other intervertebral disc degeneration, lumbosacral region: Secondary | ICD-10-CM | POA: Insufficient documentation

## 2021-08-17 DIAGNOSIS — M47816 Spondylosis without myelopathy or radiculopathy, lumbar region: Secondary | ICD-10-CM | POA: Diagnosis not present

## 2021-08-17 DIAGNOSIS — Z79891 Long term (current) use of opiate analgesic: Secondary | ICD-10-CM | POA: Diagnosis not present

## 2021-08-17 DIAGNOSIS — M961 Postlaminectomy syndrome, not elsewhere classified: Secondary | ICD-10-CM | POA: Insufficient documentation

## 2021-08-17 DIAGNOSIS — M542 Cervicalgia: Secondary | ICD-10-CM | POA: Diagnosis not present

## 2021-08-17 DIAGNOSIS — M25552 Pain in left hip: Secondary | ICD-10-CM | POA: Diagnosis not present

## 2021-08-17 DIAGNOSIS — Z451 Encounter for adjustment and management of infusion pump: Secondary | ICD-10-CM | POA: Diagnosis not present

## 2021-08-17 DIAGNOSIS — M5441 Lumbago with sciatica, right side: Secondary | ICD-10-CM | POA: Diagnosis not present

## 2021-08-17 DIAGNOSIS — G894 Chronic pain syndrome: Secondary | ICD-10-CM | POA: Diagnosis not present

## 2021-08-17 DIAGNOSIS — M533 Sacrococcygeal disorders, not elsewhere classified: Secondary | ICD-10-CM | POA: Diagnosis not present

## 2021-08-17 DIAGNOSIS — Z978 Presence of other specified devices: Secondary | ICD-10-CM | POA: Insufficient documentation

## 2021-08-17 DIAGNOSIS — G96198 Other disorders of meninges, not elsewhere classified: Secondary | ICD-10-CM | POA: Diagnosis not present

## 2021-08-17 DIAGNOSIS — Z79899 Other long term (current) drug therapy: Secondary | ICD-10-CM | POA: Insufficient documentation

## 2021-08-17 DIAGNOSIS — M25551 Pain in right hip: Secondary | ICD-10-CM | POA: Diagnosis not present

## 2021-08-17 MED ORDER — OXYCODONE-ACETAMINOPHEN 5-325 MG PO TABS: 1.0000 | ORAL_TABLET | Freq: Three times a day (TID) | ORAL | 0 refills | Status: DC | PRN

## 2021-08-17 NOTE — Progress Notes (Signed)
PROVIDER NOTE: Interpretation of information contained herein should be left to medically-trained personnel. Specific patient instructions are provided elsewhere under "Patient Instructions" section of medical record. This document was created in part using STT-dictation technology, any transcriptional errors that may result from this process are unintentional.  Patient: Melinda [name] Type: Established DOB: 11/11/1955 MRN: [id number] PCP: Jearld Fenton, NP  Service: Procedure DOS: 08/17/2021 Setting: Ambulatory Location: Ambulatory outpatient facility Delivery: Face-to-face Provider: Gaspar Cola, MD Specialty: Interventional Pain Management Specialty designation: 09 Location: Outpatient facility Ref. Prov.: Jearld Fenton, NP    Primary Reason for Visit: Interventional Pain Management Treatment. CC: Back Pain (buttocks)  Procedure:          Type: Management of Intrathecal Drug Delivery System (IDDS) - Reservoir Refill [phone]). 20% rate increase.  Indications: 1. Chronic pain syndrome   2. Chronic low back pain (Bilateral) (L>R) w/ sciatica (Right)   3. Chronic sacroiliac joint pain (Bilateral)   4. DDD (degenerative disc disease), lumbosacral   5. Chronic hip pain (Bilateral)   6. Failed back surgical syndrome   7. Epidural fibrosis   8. Lumbar facet syndrome   9. Cervicalgia   10. 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 Encounter for interrogation of infusion pump   12. Encounter for adjustment or management of infusion pump   13. Long term current use of opiate analgesic   14. Opiate use (22.5 MME/Day) (oral)   15. Pharmacologic therapy   16. Chronic use of opiate for therapeutic purpose   17. Encounter for medication management   18. Encounter for chronic pain management    Pain Assessment: Self-Reported Pain Score: 4 /10             Reported level is compatible with observation.         Intrathecal Drug Delivery  System (IDDS)  Pump Device:  Manufacturer: Medtronic Model: Synchromed II Model No.: [id number] Serial No.: [id number] H Delivery Route: Intrathecal Type: Programmable  Volume (mL): 40 mL reservoir Priming Volume: n/a  Calibration Constant: 113.0  MRI compatibility: Conditional   Implant Details:  Date: 03/11/2020  Implanter: [name], MD  Contact Information: [place], [place] Last Revision/Replacement: 03/11/2020 Estimated Replacement Date: Jul 2026  Implant Site: Abdominal Laterality: Right  Catheter: Manufacturer: Medtronic Model: Ascenda Model No.: 8250  Serial No.: [id number]  Implanted Length (cm): 102.1  Catheter Volume (mL): 0.225  Tip Location (Level): T6-7 Canal Access Site: T12-L1  Drug content:  Primary Medication Class: Opioid  Medication: PF-Fentanyl  Concentration: 500 mcg/mL  Secondary Medication Class: Local Anesthetic  Medication: PF-Bupivacaine  Concentration: 10 mg/mL  Tertiary Medication Class: Antispasmodic  Medication: PF-Baclofen  Concentration: 120 mcg/mL  Fourth Medication Class: none   PA parameters (PCA-mode):  Mode: Off (Inactive)  Programming:  Type: Simple continuous.  Medication, Concentration, Infusion Program, & Delivery Rate: For up-to-date details please see most recent scanned programming printout.    Changes:  Medication Change: None at this point Rate Change: 20% increase  Reported side-effects or adverse reactions: None reported  Effectiveness: Described as relatively effective, allowing for increase in activities of daily living (ADL) Clinically meaningful improvement in function (CMIF): Sustained CMIF goals met  Plan: Pump refill today   Pharmacotherapy Assessment   Opioid Analgesic: Oxycodone 5 mg, 1 tab PO q 8 hrs (15 mg/day of oxycodone) + Intrathecal PF-Fentanyl MME/day: 22.5 mg/day (oral).   Monitoring: [place] PMP: PDMP not reviewed this encounter.  Pharmacotherapy: No  side-effects or adverse reactions reported. Compliance: No problems identified. Effectiveness: Clinically acceptable. Plan: Refer to "POC". UDS:  Summary  Date Value Ref Range Status  06/20/2021 Note  Final    Comment:    ==================================================================== Tox[employer] Select 13 (MW) ==================================================================== Test                             Result       Flag       Units  Drug Present and Declared for Prescription Verification   Oxycodone                      659          EXPECTED   ng/mg creat   Oxymorphone                    161          EXPECTED   ng/mg creat   Noroxycodone                   3382         EXPECTED   ng/mg creat   Noroxymorphone                 53           EXPECTED   ng/mg creat    Sources of oxycodone are scheduled prescription medications.    Oxymorphone, noroxycodone, and noroxymorphone are expected    metabolites of oxycodone. Oxymorphone is also available as a    scheduled prescription medication.  Drug Present not Declared for Prescription Verification   Carboxy-THC                    136          UNEXPECTED ng/mg creat    Carboxy-THC is a metabolite of tetrahydrocannabinol (THC). Source of    THC is most commonly herbal marijuana or marijuana-based products,    but THC is also present in a scheduled prescription medication.    Trace amounts of THC can be present in hemp and cannabidiol (CBD)    products. This test is not intended to distinguish between delta-9-    tetrahydrocannabinol, the predominant form of THC in most herbal or    marijuana-based products, and delta-8-tetrahydrocannabinol.    Fentanyl                       5            UNEXPECTED ng/mg creat   Norfentanyl                    108          UNEXPECTED ng/mg creat    Source of fentanyl is a scheduled prescription medication, including    IV, patch, and transmucosal formulations. Norfentanyl is an expected     metabolite of fentanyl.  ==================================================================== Test                      Result    Flag   Units      Ref Range   Creatinine              198              mg/dL      >=20 ==================================================================== Declared Medications:  The flagging and interpretation  on this report are based on the  following declared medications.  Unexpected results may arise from  inaccuracies in the declared medications.   **Note: The testing scope of this panel includes these medications:   Oxycodone (Percocet)   **Note: The testing scope of this panel does not include the  following reported medications:   Acetaminophen (Percocet)  Aspirin  Biotin  Bupropion (Wellbutrin)  Calcium  Chondroitin  Ezetimibe (Zetia)  Fish Oil  Glucosamine  Insulin  Magnesium  Nitroglycerin (Nitrostat)  Paroxetine (Paxil)  Polyethylene Glycol  Pregabalin (Lyrica)  Rosuvastatin (Crestor)  Semaglutide (Ozempic)  Tizanidine (Zanaflex)  Vitamin B  Vitamin C  Vitamin D  Vitamin E  Zinc ==================================================================== For clinical consultation, please call [phone]. ====================================================================      Pre-op H&P Assessment:  Ms. Ellwood is a 66 y.o. (year old), female patient, seen today for interventional treatment. 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 Ms. Edds has a current medication list which includes the following prescription(s): accu-chek guide, accu-chek softclix lancets, AMBULATORY NON FORMULARY MEDICATION, vitamin c, aspirin, b complex vitamins, biotin, accu-chek guide control, bupropion, calcium-magnesium-zinc, vitamin d, cinnamon, ezetimibe, glucosamine-chondroitin, insulin pen needle, magnesium, midodrine, nitroglycerin, NONFORMULARY OR COMPOUNDED ITEM, fish oil, ozempic (0.25 or 0.5 mg/dose), paroxetine, polyethylene glycol, pregabalin, rosuvastatin, tizanidine, vitamin e, [START ON 10/26/2021] oxycodone-acetaminophen, [START ON 08/27/2021] oxycodone-acetaminophen, and [START ON 09/26/2021] oxycodone-acetaminophen. Her primarily concern today is the Back Pain (buttocks)  Initial Vital Signs:  Pulse/HCG Rate: 80  Temp: (!) 97.3 F (36.3 C) Resp: 18 BP: 105/67 SpO2: 96 %  BMI: Estimated body mass index is 30.27 kg/m as calculated from the following:   Height as of this encounter: _0  (1.753 m).   Weight as of this encounter: 205 lb (93 kg).  Risk Assessment: Allergies: Reviewed. She has No Known Allergies.  Allergy Precautions: None required Coagulopathies: Reviewed. None identified.  Blood-thinner therapy: None at this time Active Infection(s): Reviewed. None identified. Ms. Popiel is afebrile  Site Confirmation: Ms. Hollis was asked to confirm the procedure and laterality before marking the site Procedure checklist: Completed Consent: Before the procedure and under the influence of no sedative(s), amnesic(s), or anxiolytics, the patient was informed of the  treatment options, risks and possible complications. To fulfill our ethical and legal obligations, as recommended by the American Medical Association's Code of Ethics, I have informed the patient of my clinical impression; the nature and purpose of the treatment or procedure; the risks, benefits, and possible complications of the intervention; the alternatives, including doing nothing; the risk(s) and benefit(s) of the alternative treatment(s) or procedure(s); and the risk(s) and benefit(s) of doing nothing.  Ms. Gortney was provided with information about the general risks and possible complications associated with most interventional procedures. These include, but are not limited to: failure to achieve desired goals, infection, bleeding, organ or nerve damage, allergic reactions, paralysis, and/or death.  In  addition, she was informed of those risks and possible complications associated to this particular procedure, which include, but are not limited to: damage to the implant; failure to decrease pain; local, systemic, or serious CNS infections, intraspinal abscess with possible cord compression and paralysis, or life-threatening such as meningitis; bleeding; organ damage; nerve injury or damage with subsequent sensory, motor, and/or autonomic system dysfunction, resulting in transient or permanent pain, numbness, and/or weakness of one or several areas of the body; allergic reactions, either minor or major life-threatening, such as anaphylactic or anaphylactoid reactions.  Furthermore, Ms. Beachem was informed of those risks and complications associated with the medications. These include, but are not limited to: allergic reactions (i.e.: anaphylactic or anaphylactoid reactions); endorphine suppression; bradycardia and/or hypotension; water retention and/or peripheral vascular relaxation leading to lower extremity edema and possible stasis ulcers; respiratory depression and/or shortness of breath; decreased  metabolic rate leading to weight gain; swelling or edema; medication-induced neural toxicity; particulate matter embolism and blood vessel occlusion with resultant organ, and/or nervous system infarction; and/or intrathecal granuloma formation with possible spinal cord compression and permanent paralysis.  Before refilling the pump Ms. Nangle was informed that some of the medications used in the devise may not be FDA approved for such use and therefore it constitutes an off-label use of the medications.  Finally, she was informed that Medicine is not an exact science; therefore, there is also the possibility of unforeseen or unpredictable risks and/or possible complications that may result in a catastrophic outcome. The patient indicated having understood very clearly. We have given the patient no guarantees and we have made no promises. Enough time was given to the patient to ask questions, all of which were answered to the patient's satisfaction. Ms. Troxler has indicated that she wanted to continue with the procedure. Attestation: I, the ordering provider, attest that I have discussed with the patient the benefits, risks, side-effects, alternatives, likelihood of achieving goals, and potential problems during recovery for the procedure that I have provided informed consent. Date   Time: 08/17/2021 12:55 PM  Pre-Procedure Preparation:  Monitoring: As per clinic protocol. Respiration, ETCO2, SpO2, BP, heart rate and rhythm monitor placed and checked for adequate function Safety Precautions: Patient was assessed for positional comfort and pressure points before starting the procedure. Time-out: I initiated and conducted the "Time-out" before starting the procedure, as per protocol. The patient was asked to participate by confirming the accuracy of the "Time Out" information. Verification of the correct person, site, and procedure were performed and confirmed by me, the nursing staff, and the patient.  "Time-out" conducted as per Joint Commission's Universal Protocol (UP.01.01.01). Time: 1314  Description of Procedure:          Position: Supine Target Area: Central-port of intrathecal pump. Approach: Anterior, 90 degree angle approach. Area Prepped: Entire Area around the pump implant. DuraPrep (Iodine Povacrylex [0.7% available iodine] and Isopropyl Alcohol, 74% w/w) Safety Precautions: Aspiration looking for blood return was conducted prior to all injections. At no point did we inject any substances, as a needle was being advanced. No attempts were made at seeking any paresthesias. Safe injection practices and needle disposal techniques used. Medications properly checked for expiration dates. SDV (single dose vial) medications used. Description of the Procedure: Protocol guidelines were followed. Two nurses trained to do implant refills were present during the entire procedure. The refill medication was checked by both healthcare providers as well as the patient. The patient was included in the "Time-out" to verify the medication. The  patient was placed in position. The pump was identified. The area was prepped in the usual manner. The sterile template was positioned over the pump, making sure the side-port location matched that of the pump. Both, the pump and the template were held for stability. The needle provided in the Medtronic Kit was then introduced thru the center of the template and into the central port. The pump content was aspirated and discarded volume documented. The new medication was slowly infused into the pump, thru the filter, making sure to avoid overpressure of the device. The needle was then removed and the area cleansed, making sure to leave some of the prepping solution back to take advantage of its long term bactericidal properties. The pump was interrogated and programmed to reflect the correct medication, volume, and dosage. The program was printed and taken to the physician  for approval. Once checked and signed by the physician, a copy was provided to the patient and another scanned into the EMR.  Vitals:   08/17/21 1254  BP: 105/67  Pulse: 80  Resp: 18  Temp: (!) 97.3 F (36.3 C)  SpO2: 96%  Weight: 205 lb (93 kg)  Height: _0  (1.753 m)    Start Time: 1316 hrs. End Time:   hrs. Materials & Medications: Medtronic Refill Kit Medication(s): Please see chart orders for details.  Imaging Guidance:          Type of Imaging Technique: None used Indication(s): N/A Exposure Time: No patient exposure Contrast: None used. Fluoroscopic Guidance: N/A Ultrasound Guidance: N/A Interpretation: N/A  Antibiotic Prophylaxis:   Anti-infectives (From admission, onward)    None      Indication(s): None identified  Post-operative Assessment:  Post-procedure Vital Signs:  Pulse/HCG Rate: 80  Temp: (!) 97.3 F (36.3 C) Resp: 18 BP: 105/67 SpO2: 96 %  EBL: None  Complications: No immediate post-treatment complications observed by team, or reported by patient.  Note: The patient tolerated the entire procedure well. A repeat set of vitals were taken after the procedure and the patient was kept under observation following institutional policy, for this type of procedure. Post-procedural neurological assessment was performed, showing return to baseline, prior to discharge. The patient was provided with post-procedure discharge instructions, including a section on how to identify potential problems. Should any problems arise concerning this procedure, the patient was given instructions to immediately contact us, at any time, without hesitation. In any case, we plan to contact the patient by telephone for a follow-up status report regarding this interventional procedure.  Comments:  No additional relevant information.  Plan of Care  Orders:  Orders Placed This Encounter  Procedures   PUMP REFILL    Maintain Protocol by having two(2) healthcare providers  during procedure and programming.    Scheduling Instructions:     Please refill intrathecal pump today.    Order Specific Question:   Where will this procedure be performed?    Answer:   [facility]   PUMP REFILL    Whenever possible schedule on a procedure today.    Standing Status:   Future    Standing Expiration Date:   12/15/2021    Scheduling Instructions:     Please schedule intrathecal pump refill based on pump programming. Avoid schedule intervals of more than 120 days (4 months).    Order Specific Question:   Where will this procedure be performed?    Answer:   [facility]   PUMP REPROGRAM    Follow programming  protocol by having two(2) healthcare providers present during programming.    Scheduling Instructions:     Please perform the following adjustment: Increase rate by 20%.    Order Specific Question:   Where will this procedure be performed?    Answer:   [facility]   ToxASSURE Select 13 (MW), Urine    Volume: 30 ml(s). Minimum 3 ml of urine is needed. Document temperature of fresh sample. Indications: Long term (current) use of opiate analgesic (Z79.891)    Order Specific Question:   Release to patient    Answer:   Immediate   Delta-8 / Delta-9 THC mtb,MS,U    Minimum Volume: 3 mL Submission/Transport: (<3 days) [facility] Test: [id number] CPT: [phone]    Order Specific Question:   Release to patient    Answer:   Immediate   Cannabidiol (CBD)/Tetrahydrocannabinol [facility]) Ratio, Urine    [facility] Test: [id number]    Order Specific Question:   Release to patient    Answer:   Immediate   Informed Consent Details: Physician/Practitioner Attestation; Transcribe to consent form and obtain patient signature    Transcribe to consent form and obtain patient signature.    Order Specific Question:   Physician/Practitioner attestation of informed consent for procedure/surgical case    Answer:   I, the physician/practitioner, attest that I have discussed  with the patient the benefits, risks, side effects, alternatives, likelihood of achieving goals and potential problems during recovery for the procedure that I have provided informed consent.    Order Specific Question:   Procedure    Answer:   Intrathecal pump refill    Order Specific Question:   Physician/Practitioner performing the procedure    Answer:   Attending Physician: Kathlen Brunswick. Dossie Arbour, MD & designated trained staff    Order Specific Question:   Indication/Reason    Answer:   Chronic Pain Syndrome (G89.4), presence of an intrathecal pump (Z97.8)   Chronic Opioid Analgesic:  Oxycodone 5 mg, 1 tab PO q 8 hrs (15 mg/day of oxycodone) + Intrathecal PF-Fentanyl MME/day: 22.5 mg/day (oral).   Medications ordered for procedure: Meds ordered this encounter  Medications   DISCONTD: oxyCODONE-acetaminophen (PERCOCET) 5-325 MG tablet    Sig: Take 1 tablet by mouth every 8 (eight) hours as needed for severe pain. Each refill must last 30 days.    Dispense:  90 tablet    Refill:  0    DO NOT: delete (not duplicate); no partial-fill (will deny script to complete), no refill request (F/U required). DISPENSE: 1 day early if closed on fill date. WARN: No CNS-depressants within 8 hrs of med.   DISCONTD: oxyCODONE-acetaminophen (PERCOCET) 5-325 MG tablet    Sig: Take 1 tablet by mouth every 8 (eight) hours as needed for severe pain. Each refill must last 30 days.    Dispense:  90 tablet    Refill:  0    DO NOT: delete (not duplicate); no partial-fill (will deny script to complete), no refill request (F/U required). DISPENSE: 1 day early if closed on fill date. WARN: No CNS-depressants within 8 hrs of med.   oxyCODONE-acetaminophen (PERCOCET) 5-325 MG tablet    Sig: Take 1 tablet by mouth every 8 (eight) hours as needed for severe pain. Each refill must last 30 days.    Dispense:  90 tablet    Refill:  0    DO NOT: delete (not duplicate); no partial-fill (will deny script to complete), no refill  request (F/U required). DISPENSE: 1 day early  if closed on fill date. WARN: No CNS-depressants within 8 hrs of med.   oxyCODONE-acetaminophen (PERCOCET) 5-325 MG tablet    Sig: Take 1 tablet by mouth every 8 (eight) hours as needed for severe pain. Each refill must last 30 days.    Dispense:  90 tablet    Refill:  0    DO NOT: delete (not duplicate); no partial-fill (will deny script to complete), no refill request (F/U required). DISPENSE: 1 day early if closed on fill date. WARN: No CNS-depressants within 8 hrs of med.   oxyCODONE-acetaminophen (PERCOCET) 5-325 MG tablet    Sig: Take 1 tablet by mouth every 8 (eight) hours as needed for severe pain. Each refill must last 30 days.    Dispense:  90 tablet    Refill:  0    DO NOT: delete (not duplicate); no partial-fill (will deny script to complete), no refill request (F/U required). DISPENSE: 1 day early if closed on fill date. WARN: No CNS-depressants within 8 hrs of med.   Medications administered: Lubertha South had no medications administered during this visit.  See the medical record for exact dosing, route, and time of administration.  Follow-up plan:   Return for Pump Refill (Max:82mo.       Interventional Therapies  Risk   Complexity Considerations:   Estimated body mass index is 33.45 kg/m as calculated from the following:   Height as of this encounter: _0  (1.727 m).   Weight as of this encounter: 220 lb (99.8 kg). WNL   Planned   Pending:   Diagnostic bilateral lumbar facet block  Possible bilateral therapeutic lumbar facet RFA #1    Under consideration:   Possible left lumbar facet RFA  Possible right lumbar facet RFA  Diagnostic caudal ESI + diagnostic epidurogram  Possible Racz procedure  Diagnostic bilateral IA knee injection (Steroid) Diagnostic bilateral genicular NB  Possible bilateral genicular nerve RFA  Possible bilateral Hyalgan knee injection  Diagnostic cervical ESI  Diagnostic bilateral cervical  facet block  Possible bilateral cervical facet RFA    Completed:   Diagnostic/therapeutic left L2-3 LESI x1  Diagnostic/therapeutic right lumbar facet block x3 (06/23/2019) (100/100/75/>75)  Diagnostic/therapeutic left lumbar facet block x1 (06/23/2019) (100/100/75/>75)    Therapeutic   Palliative (PRN) options:   Palliative/therapeutic intrathecal pump management (refills/programming adjustments)  Palliative left L2-3 LESI #2  Palliative right lumbar facet block #4  Diagnostic left lumbar facet block #2        Recent Visits Date Type Provider Dept  06/20/21 Procedure visit NMilinda Pointer MD [facility]  Showing recent visits within past 90 days and meeting all other requirements Today's Visits Date Type Provider Dept  08/17/21 Procedure visit NMilinda Pointer MD [facility]  Showing today's visits and meeting all other requirements Future Appointments Date Type Provider Dept  09/28/21 Appointment NMilinda Pointer MD [facility] [facility]  Showing future appointments within next 90 days and meeting all other requirements  Disposition: Discharge home  Discharge (Date   Time): 08/17/2021; 1329 hrs.   Primary Care Physician: BJearld Fenton [facility] Note by: FGaspar Cola MD Date: 08/17/2021; Time: 2:01 PM  Disclaimer:  Medicine is not an eChief Strategy Officer The only guarantee in medicine is that nothing is guaranteed. It is important to note that the decision to proceed with this intervention was based on the information collected from the patient. The Data and conclusions were drawn from the patient's questionnaire, the interview, and  the physical examination. Because the information was provided in large part by the patient, it cannot be guaranteed that it has not been purposely or unconsciously manipulated. Every effort has been made to obtain as much relevant data as possible for this evaluation. It is  important to note that the conclusions that lead to this procedure are derived in large part from the available data. Always take into account that the treatment will also be dependent on availability of resources and existing treatment guidelines, considered by other Pain Management Practitioners as being common knowledge and practice, at the time of the intervention. For Medico-Legal purposes, it is also important to point out that variation in procedural techniques and pharmacological choices are the acceptable norm. The indications, contraindications, technique, and results of the above procedure should only be interpreted and judged by a Board-Certified Interventional Pain Specialist with extensive familiarity and expertise in the same exact procedure and technique.

## 2021-08-17 NOTE — Progress Notes (Signed)
Nursing Pain Medication Assessment:  Safety precautions to be maintained throughout the outpatient stay will include: orient to surroundings, keep bed in low position, maintain call bell within reach at all times, provide assistance with transfer out of bed and ambulation.  Medication Inspection Compliance: Pill count conducted under aseptic conditions, in front of the patient. Neither the pills nor the bottle was removed from the patient's sight at any time. Once count was completed pills were immediately returned to the patient in their original bottle.  Medication: Oxycodone/APAP Pill/Patch Count:  43 of 90 pills remain Pill/Patch Appearance: Markings consistent with prescribed medication Bottle Appearance: Standard pharmacy container. Clearly labeled. Filled Date: 01 / 30 / 2023 Last Medication intake:  Today

## 2021-08-17 NOTE — Patient Instructions (Addendum)
___Patient has Narcan and her family member knows how to administer it. _________________________________________________________________________________________  CBD (cannabidiol) & Delta-8 (Delta-8 tetrahydrocannabinol) WARNING  Intro: Cannabidiol (CBD) and tetrahydrocannabinol (THC), are two natural compounds found in plants of the Cannabis genus. They can both be extracted from hemp or cannabis. Hemp and cannabis come from the Cannabis sativa plant. Both compounds interact with your bodys endocannabinoid system, but they have very different effects. CBD does not produce the high sensation associated with cannabis. Delta-8 tetrahydrocannabinol, also known as delta-8 THC, is a psychoactive substance found in the Cannabis sativa plant, of which marijuana and hemp are two varieties. THC is responsible for the high associated with the illicit use of marijuana.  Applicable to: All individuals currently taking or considering taking CBD (cannabidiol) and, more important, all patients taking opioid analgesic controlled substances (pain medication). (Example: oxycodone; oxymorphone; hydrocodone; hydromorphone; morphine; methadone; tramadol; tapentadol; fentanyl; buprenorphine; butorphanol; dextromethorphan; meperidine; codeine; etc.)  Legal status: CBD remains a Schedule I drug prohibited for any use. CBD is illegal with one exception. In the [place], CBD has a limited Transport planner (FDA) approval for the treatment of two specific types of epilepsy disorders. Only one CBD product has been approved by the FDA for this purpose: "Epidiolex". FDA is aware that some companies are marketing products containing cannabis and cannabis-derived compounds in ways that violate the [employer], Drug and Cosmetic Act [employer] Act) and that may put the health and safety of consumers at risk. The FDA, a Federal agency, has not enforced the CBD status since 2018.   Legality: Some manufacturers ship CBD  products nationally, which is illegal. Often such products are sold online and are therefore available throughout the country. CBD is openly sold in head shops and health food stores in some states where such sales have not been explicitly legalized. Selling unapproved products with unsubstantiated therapeutic claims is not only a violation of the law, but also can put patients at risk, as these products have not been proven to be safe or effective. Federal illegality makes it difficult to conduct research on CBD.  Reference: "FDA Regulation of Cannabis and Cannabis-Derived Products, Including Cannabidiol (CBD)" - [employer]  Warning: CBD is not FDA approved and has not undergo the same manufacturing controls as prescription drugs.  This means that the purity and safety of available CBD may be questionable. Most of the time, despite manufacturer's claims, it is contaminated with THC (delta-9-tetrahydrocannabinol - the chemical in marijuana responsible for the "HIGH").  When this is the case, the [facility] contaminant will trigger a positive urine drug screen (UDS) test for Marijuana (carboxy-THC). Because a positive UDS for any illicit substance is a violation of our medication agreement, your opioid analgesics (pain medicine) may be permanently discontinued. The FDA recently put out a warning about 5 things that everyone should be aware of regarding Delta-8 THC: Delta-8 THC products have not been evaluated or approved by the FDA for safe use and may be marketed in ways that put the public health at risk. The FDA has received adverse event reports involving delta-8 THC-containing products. Delta-8 THC has psychoactive and intoxicating effects. Delta-8 THC manufacturing often involve use of potentially harmful chemicals to create the concentrations of delta-8 THC claimed in the marketplace. The  final delta-8 THC product may have potentially harmful by-products (contaminants) due to the chemicals used in the process. Manufacturing of delta-8 THC products may occur in uncontrolled or unsanitary settings, which may lead to the presence of unsafe contaminants  or other potentially harmful substances. Delta-8 THC products should be kept out of the reach of children and pets.  MORE ABOUT CBD  General Information: CBD was discovered in [birth year] and it is a derivative of the cannabis sativa genus plants (Marijuana and Hemp). It is one of the 113 identified substances found in Marijuana. It accounts for up to 40% of the plant's extract. As of 2018, preliminary clinical studies on CBD included research for the treatment of anxiety, movement disorders, and pain. CBD is available and consumed in multiple forms, including inhalation of smoke or vapor, as an aerosol spray, and by mouth. It may be supplied as an oil containing CBD, capsules, dried cannabis, or as a liquid solution. CBD is thought not to be as psychoactive as THC (delta-9-tetrahydrocannabinol - the chemical in marijuana responsible for the "HIGH"). Studies suggest that CBD may interact with different biological target receptors in the body, including cannabinoid and other neurotransmitter receptors. As of 2018 the mechanism of action for its biological effects has not been determined.  Side-effects   Adverse reactions: Dry mouth, diarrhea, decreased appetite, fatigue, drowsiness, malaise, weakness, sleep disturbances, and others.  Drug interactions: CBC may interact with other medications such as blood-thinners. (Last update: 03/31/2021) ____________________________________________________________________________________________  ____________________________________________________________________________________________  Medication Rules  Purpose: To inform patients, and their family members, of our rules and regulations.  Applies to: All  patients receiving prescriptions (written or electronic).  Pharmacy of record: Pharmacy where electronic prescriptions will be sent. If written prescriptions are taken to a different pharmacy, please inform the nursing staff. The pharmacy listed in the electronic medical record should be the one where you would like electronic prescriptions to be sent.  Electronic prescriptions: In compliance with the [place] (STOP) Act of 2017 (Session Lanny Cramp [phone]), effective July 02, 2018, all controlled substances must be electronically prescribed. Calling prescriptions to the pharmacy will cease to exist.  Prescription refills: Only during scheduled appointments. Applies to all prescriptions.  NOTE: The following applies primarily to controlled substances (Opioid* Pain Medications).   Type of encounter (visit): For patients receiving controlled substances, face-to-face visits are required. (Not an option or up to the patient.)  Patient's responsibilities: Pain Pills: Bring all pain pills to every appointment (except for procedure appointments). Pill Bottles: Bring pills in original pharmacy bottle. Always bring the newest bottle. Bring bottle, even if empty. Medication refills: You are responsible for knowing and keeping track of what medications you take and those you need refilled. The day before your appointment: write a list of all prescriptions that need to be refilled. The day of the appointment: give the list to the admitting nurse. Prescriptions will be written only during appointments. No prescriptions will be written on procedure days. If you forget a medication: it will not be "Called in", "Faxed", or "electronically sent". You will need to get another appointment to get these prescribed. No early refills. Do not call asking to have your prescription filled early. Prescription Accuracy: You are responsible for carefully inspecting your prescriptions  before leaving our office. Have the discharge nurse carefully go over each prescription with you, before taking them home. Make sure that your name is accurately spelled, that your address is correct. Check the name and dose of your medication to make sure it is accurate. Check the number of pills, and the written instructions to make sure they are clear and accurate. Make sure that you are given enough medication to last until your next medication refill  appointment. Taking Medication: Take medication as prescribed. When it comes to controlled substances, taking less pills or less frequently than prescribed is permitted and encouraged. Never take more pills than instructed. Never take medication more frequently than prescribed.  Inform other Doctors: Always inform, all of your healthcare providers, of all the medications you take. Pain Medication from other Providers: You are not allowed to accept any additional pain medication from any other Doctor or Healthcare provider. There are two exceptions to this rule. (see below) In the event that you require additional pain medication, you are responsible for notifying us, as stated below. Cough Medicine: Often these contain an opioid, such as codeine or hydrocodone. Never accept or take cough medicine containing these opioids if you are already taking an opioid* medication. The combination may cause respiratory failure and death. Medication Agreement: You are responsible for carefully reading and following our Medication Agreement. This must be signed before receiving any prescriptions from our practice. Safely store a copy of your signed Agreement. Violations to the Agreement will result in no further prescriptions. (Additional copies of our Medication Agreement are available upon request.) Laws, Rules, & Regulations: All patients are expected to follow all Federal and [employer], [employer], Rules, [employer]. Ignorance of the Laws does not constitute a valid  excuse.  Illegal drugs and Controlled Substances: The use of illegal substances (including, but not limited to marijuana and its derivatives) and/or the illegal use of any controlled substances is strictly prohibited. Violation of this rule may result in the immediate and permanent discontinuation of any and all prescriptions being written by our practice. The use of any illegal substances is prohibited. Adopted CDC guidelines & recommendations: Target dosing levels will be at or below 60 MME/day. Use of benzodiazepines** is not recommended.  Exceptions: There are only two exceptions to the rule of not receiving pain medications from other Healthcare Providers. Exception #1 (Emergencies): In the event of an emergency (i.e.: accident requiring emergency care), you are allowed to receive additional pain medication. However, you are responsible for: As soon as you are able, call our office [phone], at any time of the day or night, and leave a message stating your name, the date and nature of the emergency, and the name and dose of the medication prescribed. In the event that your call is answered by a member of our staff, make sure to document and save the date, time, and the name of the person that took your information.  Exception #2 (Planned Surgery): In the event that you are scheduled by another doctor or dentist to have any type of surgery or procedure, you are allowed (for a period no longer than 30 days), to receive additional pain medication, for the acute post-op pain. However, in this case, you are responsible for picking up a copy of our "Post-op Pain Management for Surgeons" handout, and giving it to your surgeon or dentist. This document is available at our office, and does not require an appointment to obtain it. Simply go to our office during business hours (Monday-Thursday from 8:00 AM to 4:00 PM) (Friday 8:00 AM to 12:00 Noon) or if you have a scheduled appointment with [place], prior to your  surgery, and ask for it by name. In addition, you are responsible for: calling our office [phone], at any time of the day or night, and leaving a message stating your name, name of your surgeon, type of surgery, and date of procedure or surgery. Failure to comply with  your responsibilities may result in termination of therapy involving the controlled substances. Medication Agreement Violation. Following the above rules, including your responsibilities will help you in avoiding a Medication Agreement Violation (Breaking your Pain Medication Contract).  *Opioid medications include: morphine, codeine, oxycodone, oxymorphone, hydrocodone, hydromorphone, meperidine, tramadol, tapentadol, buprenorphine, fentanyl, methadone. **Benzodiazepine medications include: diazepam (Valium), alprazolam (Xanax), clonazepam (Klonopine), lorazepam (Ativan), clorazepate (Tranxene), chlordiazepoxide (Librium), estazolam (Prosom), oxazepam (Serax), temazepam (Restoril), triazolam (Halcion) (Last updated: 03/29/2021) ____________________________________________________________________________________________  ____________________________________________________________________________________________  Medication Recommendations and Reminders  Applies to: All patients receiving prescriptions (written and/or electronic).  Medication Rules & Regulations: These rules and regulations exist for your safety and that of others. They are not flexible and neither are we. Dismissing or ignoring them will be considered "non-compliance" with medication therapy, resulting in complete and irreversible termination of such therapy. (See document titled "Medication Rules" for more details.) In all conscience, because of safety reasons, we cannot continue providing a therapy where the patient does not follow instructions.  Pharmacy of record:  Definition: This is the pharmacy where your electronic prescriptions will be sent.  We do  not endorse any particular pharmacy, however, we have experienced problems with [facility] not securing enough medication supply for the community. We do not restrict you in your choice of pharmacy. However, once we write for your prescriptions, we will NOT be re-sending more prescriptions to fix restricted supply problems created by your pharmacy, or your insurance.  The pharmacy listed in the electronic medical record should be the one where you want electronic prescriptions to be sent. If you choose to change pharmacy, simply notify our nursing staff.  Recommendations: Keep all of your pain medications in a safe place, under lock and key, even if you live alone. We will NOT replace lost, stolen, or damaged medication. After you fill your prescription, take 1 week's worth of pills and put them away in a safe place. You should keep a separate, properly labeled bottle for this purpose. The remainder should be kept in the original bottle. Use this as your primary supply, until it runs out. Once it's gone, then you know that you have 1 week's worth of medicine, and it is time to come in for a prescription refill. If you do this correctly, it is unlikely that you will ever run out of medicine. To make sure that the above recommendation works, it is very important that you make sure your medication refill appointments are scheduled at least 1 week before you run out of medicine. To do this in an effective manner, make sure that you do not leave the office without scheduling your next medication management appointment. Always ask the nursing staff to show you in your prescription , when your medication will be running out. Then arrange for the receptionist to get you a return appointment, at least 7 days before you run out of medicine. Do not wait until you have 1 or 2 pills left, to come in. This is very poor planning and does not take into consideration that we may need to cancel appointments due to bad weather,  sickness, or emergencies affecting our staff. DO NOT ACCEPT A "Partial Fill": If for any reason your pharmacy does not have enough pills/tablets to completely fill or refill your prescription, do not allow for a "partial fill". The law allows the pharmacy to complete that prescription within 72 hours, without requiring a new prescription. If they do not fill the rest of your prescription within those 72 hours, you will  need a separate prescription to fill the remaining amount, which we will NOT provide. If the reason for the partial fill is your insurance, you will need to talk to the pharmacist about payment alternatives for the remaining tablets, but again, DO NOT ACCEPT A PARTIAL FILL, unless you can trust your pharmacist to obtain the remainder of the pills within 72 hours.  Prescription refills and/or changes in medication(s):  Prescription refills, and/or changes in dose or medication, will be conducted only during scheduled medication management appointments. (Applies to both, written and electronic prescriptions.) No refills on procedure days. No medication will be changed or started on procedure days. No changes, adjustments, and/or refills will be conducted on a procedure day. Doing so will interfere with the diagnostic portion of the procedure. No phone refills. No medications will be "called into the pharmacy". No Fax refills. No weekend refills. No Holliday refills. No after hours refills.  Remember:  Business hours are:  Monday to Thursday 8:00 AM to 4:00 PM Provider's Schedule: [name], MD - Appointments are:  Medication management: Monday and Wednesday 8:00 AM to 4:00 PM Procedure day: Tuesday and Thursday 7:30 AM to 4:00 PM Gillis Santa, MD - Appointments are:  Medication management: Tuesday and Thursday 8:00 AM to 4:00 PM Procedure day: Monday and Wednesday 7:30 AM to 4:00 PM (Last update:  01/20/2020) ____________________________________________________________________________________________  ____________________________________________________________________________________________  Drug Holidays (Slow)  What is a "Drug Holiday"? Drug Holiday: is the name given to the period of time during which a patient stops taking a medication(s) for the purpose of eliminating tolerance to the drug.  Benefits Improved effectiveness of opioids. Decreased opioid dose needed to achieve benefits. Improved pain with lesser dose.  What is tolerance? Tolerance: is the progressive decreased in effectiveness of a drug due to its repetitive use. With repetitive use, the body gets use to the medication and as a consequence, it loses its effectiveness. This is a common problem seen with opioid pain medications. As a result, a larger dose of the drug is needed to achieve the same effect that used to be obtained with a smaller dose.  How long should a "Drug Holiday" last? You should stay off of the pain medicine for at least 14 consecutive days. (2 weeks)  Should I stop the medicine "cold [place]"? No. You should always coordinate with your Pain Specialist so that he/she can provide you with the correct medication dose to make the transition as smoothly as possible.  How do I stop the medicine? Slowly. You will be instructed to decrease the daily amount of pills that you take by one (1) pill every seven (7) days. This is called a "slow downward taper" of your dose. For example: if you normally take four (4) pills per day, you will be asked to drop this dose to three (3) pills per day for seven (7) days, then to two (2) pills per day for seven (7) days, then to one (1) per day for seven (7) days, and at the end of those last seven (7) days, this is when the "Drug Holiday" would start.   Will I have withdrawals? By doing a "slow downward taper" like this one, it is unlikely that you will experience  any significant withdrawal symptoms. Typically, what triggers withdrawals is the sudden stop of a high dose opioid therapy. Withdrawals can usually be avoided by slowly decreasing the dose over a prolonged period of time. If you do not follow these instructions and decide to stop your  medication abruptly, withdrawals may be possible.  What are withdrawals? Withdrawals: refers to the wide range of symptoms that occur after stopping or dramatically reducing opiate drugs after heavy and prolonged use. Withdrawal symptoms do not occur to patients that use low dose opioids, or those who take the medication sporadically. Contrary to benzodiazepine (example: Valium, Xanax, etc.) or alcohol withdrawals (Delirium Tremens), opioid withdrawals are not lethal. Withdrawals are the physical manifestation of the body getting rid of the excess receptors.  Expected Symptoms Early symptoms of withdrawal may include: Agitation Anxiety Muscle aches Increased tearing Insomnia Runny nose Sweating Yawning  Late symptoms of withdrawal may include: Abdominal cramping Diarrhea Dilated pupils Goose bumps Nausea Vomiting  Will I experience withdrawals? Due to the slow nature of the taper, it is very unlikely that you will experience any.  What is a slow taper? Taper: refers to the gradual decrease in dose.  (Last update: 01/20/2020) ____________________________________________________________________________________________   Opioid Overdose Opioids are drugs that are often used to treat pain. Opioids include illegal drugs, such as heroin, as well as prescription pain medicines, such as codeine, morphine, hydrocodone, and fentanyl. An opioid overdose happens when you take too much of an opioid. An overdose may be intentional or accidental and can happen with any type of opioid. The effects of an overdose can be mild, dangerous, or even deadly. Opioid overdose is a medical emergency. What are the  causes? This condition may be caused by: Taking too much of an opioid on purpose. Taking too much of an opioid by accident. Using two or more substances that contain opioids at the same time. Taking an opioid with a substance that affects your heart, breathing, or blood pressure. These include alcohol, tranquilizers, sleeping pills, illegal drugs, and some over-the-counter medicines. This condition may also happen due to an error made by: A health care provider who prescribes a medicine. The pharmacist who fills the prescription. What increases the risk? This condition is more likely in: Children. They may be attracted to colorful pills. Because of a child's small size, even a small amount of a medicine can be dangerous. Older people. They may be taking many different medicines. Older people may have difficulty reading labels or remembering when they last took their medicines. They may also be more sensitive to the effects of opioids. People with chronic medical conditions, especially heart, liver, kidney, or neurological diseases. People who take an opioid for a long period of time. People who take opioids and use illegal drugs, such as heroin, or other substances, such as alcohol. People who: Have a history of drug or alcohol abuse. Have certain mental health conditions. Have a history of previous drug overdoses. People who take opioids that are not prescribed for them. What are the signs or symptoms? Symptoms of this condition depend on the type of opioid and the amount that was taken. Common symptoms include: Sleepiness or difficulty waking from sleep. Confusion. Slurred speech. Slowed breathing and a slow pulse (bradycardia). Nausea and vomiting. Abnormally small pupils. Signs and symptoms that require emergency treatment include: Cold, clammy, and pale skin. Blue lips and fingernails. Vomiting. Gurgling sounds in the throat. A pulse that is very slow or difficult to  detect. Breathing that is very irregular, slow, noisy, or difficult to detect. Inability to respond to speech or be awakened from sleep (stupor). Seizures. How is this diagnosed? This condition is diagnosed based on your symptoms and medical history. It is important to tell your health care provider: About all of  the opioids that you took. When you took the opioids. Whether you were drinking alcohol or using marijuana, cocaine, or other drugs. Your health care provider will do a physical exam. This exam may include: Checking and monitoring your heart rate and rhythm, breathing rate, temperature, and blood pressure. Measuring oxygen levels in your blood. Checking for abnormally small pupils. You may also have blood tests or urine tests. You may have X-rays if you are having severe breathing problems. How is this treated? This condition requires immediate medical treatment and hospitalization. Reversing the effects of the opioid is the first step in treatment. If you have a Narcan kit or naloxone, use it right away. Follow your health care provider's instructions. A friend or family member can also help you with this. The rest of your treatment will be given in the hospital intensive care (ICU). Treatment in the hospital may include: Giving salts and minerals (electrolytes) along with fluids through an IV. Inserting a breathing tube (endotracheal tube) in your airway to help you breathe if you cannot breathe on your own or you are in danger of not being able to breathe on your own. Giving oxygen through a small tube under your nose. Passing a tube through your nose and into your stomach (nasogastric tube, or NG tube) to empty your stomach. Giving medicines that: Increase your blood pressure. Relieve nausea and vomiting. Relieve abdominal pain and cramping. Reverse the effects of the opioid (naloxone). Monitoring your heart and oxygen levels. Ongoing counseling and mental health support if  you intentionally overdosed or used an illegal drug. Follow these instructions at home: Medicines Take over-the-counter and prescription medicines only as told by your health care provider. Always ask your health care provider about possible side effects and interactions of any new medicine that you start taking. Keep a list of all the medicines that you take, including over-the-counter medicines. Bring this list with you to all your medical visits. General instructions Drink enough fluid to keep your urine pale yellow. Keep all follow-up visits. This is important. How is this prevented? Read the drug inserts that come with your opioid pain medicines. Take medicines only as told by your health care provider. Do not take more medicine than you are told. Do not take medicines more frequently than you are told. Do not drink alcohol or take sedatives when taking opioids. Do not use illegal or recreational drugs, including cocaine, ecstasy, and marijuana. Do not take opioid medicines that are not prescribed for you. Store all medicines in safety containers that are out of the reach of children. Get help if you are struggling with: Alcohol or drug use. Depression or another mental health problem. Thoughts of hurting yourself or another person. Keep the phone number of your local poison control center near your phone or in your mobile phone. In the [place] the hotline of the [facility] is 513-513-4739. If you were prescribed naloxone, make sure you understand how to take it. Contact a health care provider if: You need help understanding how to take your pain medicines. You feel your medicines are too strong. You are concerned that your pain medicines are not working well for your pain. You develop new symptoms or side effects when you are taking medicines. Get help right away if: You or someone else is having symptoms of an opioid overdose. Get help even if you are not  sure. You have thoughts about hurting yourself or others. You have: Chest pain. Difficulty breathing. A  loss of consciousness. These symptoms may represent a serious problem that is an emergency. Do not wait to see if the symptoms will go away. Get medical help right away. Call your local emergency services (911 in the [place] Do not drive yourself to the hospital. If you ever feel like you may hurt yourself or others, or have thoughts about taking your own life, get help right away. You can go to your nearest emergency department or: Call your local emergency services (911 in the [place] Call a suicide crisis helpline, such as the [facility] at (939)478-0284 or 988 in the [place]. This is open 24 hours a day in the [place] Text the Crisis Text Line at 854-267-6939 (in the [place].). Summary Opioids are drugs that are often used to treat pain. Opioids include illegal drugs, such as heroin, as well as prescription pain medicines. An opioid overdose happens when you take too much of an opioid. Overdoses can be intentional or accidental. Opioid overdose is very dangerous. It is a life-threatening emergency. If you or someone you know is experiencing an opioid overdose, get help right away. This information is not intended to replace advice given to you by your health care provider. Make sure you discuss any questions you have with your health care provider. Document Revised: 01/11/2021 Document Reviewed: 09/28/2020 Elsevier Patient Education  [place].

## 2021-08-17 NOTE — Telephone Encounter (Signed)
Pharmacy called and the prescription for oxycodone to be filled 08-27-2021 cancelled at [facility] on [address].  The prescriptions to be filled 3-28 and 4-27 were cancelled at [facility] inside of [employer].

## 2021-08-18 NOTE — Telephone Encounter (Signed)
Called PP. Denies any needs at this time. Instructed to call if needed. 

## 2021-08-23 DIAGNOSIS — M2042 Other hammer toe(s) (acquired), left foot: Secondary | ICD-10-CM | POA: Diagnosis not present

## 2021-08-23 DIAGNOSIS — M216X1 Other acquired deformities of right foot: Secondary | ICD-10-CM | POA: Diagnosis not present

## 2021-08-23 DIAGNOSIS — M2032 Hallux varus (acquired), left foot: Secondary | ICD-10-CM | POA: Diagnosis not present

## 2021-08-23 DIAGNOSIS — Q6671 Congenital pes cavus, right foot: Secondary | ICD-10-CM | POA: Diagnosis not present

## 2021-08-23 DIAGNOSIS — Q6672 Congenital pes cavus, left foot: Secondary | ICD-10-CM | POA: Diagnosis not present

## 2021-08-23 DIAGNOSIS — M2041 Other hammer toe(s) (acquired), right foot: Secondary | ICD-10-CM | POA: Diagnosis not present

## 2021-08-23 DIAGNOSIS — G894 Chronic pain syndrome: Secondary | ICD-10-CM | POA: Diagnosis not present

## 2021-08-23 DIAGNOSIS — E1142 Type 2 diabetes mellitus with diabetic polyneuropathy: Secondary | ICD-10-CM | POA: Diagnosis not present

## 2021-08-23 DIAGNOSIS — L603 Nail dystrophy: Secondary | ICD-10-CM | POA: Diagnosis not present

## 2021-08-23 DIAGNOSIS — M216X2 Other acquired deformities of left foot: Secondary | ICD-10-CM | POA: Diagnosis not present

## 2021-08-23 DIAGNOSIS — L97522 Non-pressure chronic ulcer of other part of left foot with fat layer exposed: Secondary | ICD-10-CM | POA: Diagnosis not present

## 2021-08-23 MED FILL — Medication: INTRATHECAL | Qty: 1 | Status: AC

## 2021-08-28 ENCOUNTER — Other Ambulatory Visit: Payer: Self-pay | Admitting: *Deleted

## 2021-08-28 MED ORDER — FLUDROCORTISONE ACETATE 0.1 MG PO TABS: 0.1000 mg | ORAL_TABLET | Freq: Every day | ORAL | 3 refills | Status: DC

## 2021-08-28 NOTE — Progress Notes (Signed)
Lets go ahead and start Florinef 0.1 mg once a day.  Previously she had a cosyntropin stim test that was normal.  No evidence of adrenal insufficiency.  Candee Furbish, MD

## 2021-08-29 ENCOUNTER — Encounter: Payer: Self-pay | Admitting: Internal Medicine

## 2021-08-31 ENCOUNTER — Other Ambulatory Visit: Payer: Self-pay

## 2021-08-31 MED ORDER — PAIN MANAGEMENT IT PUMP REFILL: 1.0000 | Freq: Once | INTRATHECAL | 0 refills | Status: AC

## 2021-09-04 ENCOUNTER — Telehealth: Payer: Self-pay

## 2021-09-04 ENCOUNTER — Encounter: Payer: Self-pay | Admitting: Internal Medicine

## 2021-09-04 ENCOUNTER — Other Ambulatory Visit: Payer: Self-pay

## 2021-09-04 ENCOUNTER — Ambulatory Visit (INDEPENDENT_AMBULATORY_CARE_PROVIDER_SITE_OTHER): Payer: Medicare Other | Admitting: Internal Medicine

## 2021-09-04 ENCOUNTER — Telehealth: Payer: Self-pay | Admitting: Pain Medicine

## 2021-09-04 DIAGNOSIS — Z6831 Body mass index (BMI) 31.0-31.9, adult: Secondary | ICD-10-CM

## 2021-09-04 DIAGNOSIS — E6609 Other obesity due to excess calories: Secondary | ICD-10-CM

## 2021-09-04 DIAGNOSIS — I951 Orthostatic hypotension: Secondary | ICD-10-CM | POA: Diagnosis not present

## 2021-09-04 NOTE — Assessment & Plan Note (Signed)
She is orthostatic on exam but not symptomatic ?Her blood pressure is elevated on Midodrine and Fludrocortisone ?She has already been worked up for Addison's disease and had a cortisol stim test which was negative by endocrinology ?We will decrease her Midodrine to 5 mg twice daily ?Continue Fludrocortisone for now ?Encourage adequate water intake ?We will reach back out to Dr. Marlou Porch to see if he has any further recommendations ?

## 2021-09-04 NOTE — Progress Notes (Signed)
Subjective:    Patient ID: Melinda [name], female    DOB: [birth year], 66 y.o.   MRN: [id number]  HPI  Pt presents to the clinic today with c/o low blood pressure.  She reports this has been an ongoing issue.  She reports she can be sitting down, standing up and leaning over to pet the dog or changing from a sitting to a standing position when she will feel intermittently dizzy.  She has not had any syncopal episodes.  She is taking Midodrine and Fludrocortisone as prescribed by Dr. Marlou Porch. She reports she has not had orthostasis since starting on the Fludrocortisone.  She has had a history of CKD but her last creatinine/GFR were normal.  She drinks adequate amount of water daily.  She does have an intrathecal pain pump that releases pain medication continuously and she takes her Oxycodone routinely as prescribed.  She does not feel any side effects from her pain medications.  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   Bulging disc    CAD (coronary artery disease) 2009   s/p stent to LAD   Cardiac arrhythmia due to congenital heart disease 1992   WPW.  Ablations done.  Now has rare episodes   Chicken pox    Chronic fatigue    Chronic kidney disease    "problem with kidney filtration"   Coronary arteritis    Degenerative disc disease    Back, neck, hands, knees   Depression    Diabetes mellitus without complication (HCC)    Facet joint disease    Fibromyalgia    Heart disease    Hyperlipidemia    IBS (irritable bowel syndrome)    MCL deficiency, knee    MRSA (methicillin resistant staph aureus) culture positive 2011   GREAT TOE RIGHT FOOT   Neuropathy 04/18/2010   Orthostatic hypotension 01/2020    Peripheral neuropathy    Renal insufficiency    Restless leg syndrome    Sepsis ([place]) 09/2013   Sleep apnea 08/17/2003   uses CPAP, sleep study at [facility] (mild to moderate)   Spinal stenosis     Current Outpatient Medications  Medication Sig Dispense Refill   ACCU-CHEK GUIDE test strip USE 1 EACH BY OTHER ROUTE 3 (THREE) TIMES DAILY. USE AS INSTRUCTE 200 strip 1   Accu-Chek Softclix Lancets lancets USE TO CHECK BLOOD SUGAR 1 TIME DAILY 100 each 2   AMBULATORY NON FORMULARY MEDICATION Medication Name: CPAP MASK OF CHOICE FOR HOME DEVICE 1 each 0   Ascorbic Acid (VITAMIN C) 1000 MG tablet Take 1,000 mg by mouth daily.     ASPIRIN 81 PO Take 81 mg by mouth daily.     b complex vitamins tablet Take 1 tablet by mouth in the morning.     Biotin 10000 MCG TABS Take 10,000 mcg by mouth daily.     Blood Glucose Calibration (ACCU-CHEK GUIDE CONTROL) LIQD 1 each by In Vitro route as needed. 1 each 1   buPROPion (WELLBUTRIN XL) 150 MG 24 hr tablet Take 1 tablet (150 mg total) by mouth daily. 90 tablet 1   CALCIUM-MAGNESIUM-ZINC  PO Take 3 capsules by mouth daily.     Cholecalciferol (VITAMIN D) 50 MCG (2000 UT) tablet Take 2,000 Units by mouth daily.     CINNAMON PO Take 1,000 mg by mouth daily.     ezetimibe (ZETIA) 10 MG tablet TAKE 1 TABLET BY MOUTH EVERY DAY 90 tablet 1   fludrocortisone (FLORINEF) 0.1 MG tablet Take 1 tablet (0.1 mg total) by mouth daily. 90 tablet 3   glucosamine-chondroitin 500-400 MG tablet Take 2 tablets by mouth daily.      Insulin Pen Needle 31G X 5 MM MISC BD Pen Needles- brand specific Inject insulin via insulin pen 6 x daily 100 each 2   Magnesium 400 MG CAPS Take 400 mg by mouth at bedtime.     midodrine (PROAMATINE) 5 MG tablet Take 1 tablet (5 mg total) by mouth 3 (three) times daily with meals. 270 tablet 2   nitroGLYCERIN (NITROSTAT) 0.4 MG SL tablet PLACE 1 TABLET UNDER THE TONGUE EVERY 5 (FIVE) MINUTES AS NEEDED FOR CHEST PAIN. 25 tablet 4    NONFORMULARY OR COMPOUNDED ITEM 212.8 mcg by Epidural Infusion route. Medtronic Neuromodulation pump  Fentanyl 1,000.0 mcg/ml Baclofen 240.0 mcg/ml Bupivacaine 20.0 mg/ml Total daily dose 269.6 mcg/day     Omega-3 Fatty Acids (FISH OIL) 1200 MG CAPS Take 1,200 mg by mouth in the morning.      [START ON 10/26/2021] oxyCODONE-acetaminophen (PERCOCET) 5-325 MG tablet Take 1 tablet by mouth every 8 (eight) hours as needed for severe pain. Each refill must last 30 days. 90 tablet 0   oxyCODONE-acetaminophen (PERCOCET) 5-325 MG tablet Take 1 tablet by mouth every 8 (eight) hours as needed for severe pain. Each refill must last 30 days. 90 tablet 0   [START ON 09/26/2021] oxyCODONE-acetaminophen (PERCOCET) 5-325 MG tablet Take 1 tablet by mouth every 8 (eight) hours as needed for severe pain. Each refill must last 30 days. 90 tablet 0   OZEMPIC, 0.25 OR 0.5 MG/DOSE, 2 MG/1.5ML SOPN Inject 0.5 mg into the skin once a week. For first 4 weeks. Then increase dose to 0.'5mg'$  weekly 4.5 mL 0   PARoxetine (PAXIL) 20 MG tablet TAKE 1/2 TABLET BY MOUTH DAILY IN THE MORNING 45 tablet 0   polyethylene glycol (MIRALAX / GLYCOLAX) packet Take 17 g by mouth daily.     pregabalin (LYRICA) 150 MG capsule Take 1 capsule (150 mg total) by mouth 3 (three) times daily. 270 capsule 1   rosuvastatin (CRESTOR) 20 MG tablet TAKE 1 TABLET BY MOUTH EVERY DAY 90 tablet 0   tiZANidine (ZANAFLEX) 4 MG tablet TAKE 1 TABLET (4 MG TOTAL) BY MOUTH EVERY 8 (EIGHT) HOURS AS NEEDED FOR MUSCLE SPASMS 270 tablet 0   vitamin E 180 MG (400 UNITS) capsule Take 400 Units by mouth daily.     No current facility-administered medications for this visit.    No Known Allergies  Family History  Problem Relation Age of Onset   Cancer Mother    Lung cancer Mother    Arthritis Father    Heart disease Father    Diabetes Father    Dementia Maternal Grandmother    Alcohol abuse Paternal Grandfather    Heart disease Paternal Grandfather    Stroke  Paternal Grandfather     Social History   Socioeconomic History   Marital status: Significant Other    Spouse name: Not on file   Number of children: Not on file   Years of education: Not on file  Highest education level: Not on file  Occupational History   Not on file  Tobacco Use   Smoking status: Former    Packs/day: 1.50    Years: 32.00    Pack years: 48.00    Types: Cigarettes    Quit date: 07/22/2007    Years since quitting: 14.1   Smokeless tobacco: Never  Vaping Use   Vaping Use: Never used  Substance and Sexual Activity   Alcohol use: Yes    Comment: occ - Holidays   Drug use: Never    Comment: prescribed pain pump and oxy   Sexual activity: Yes  Other Topics Concern   Not on file  Social History Narrative   Lives at home with a partner. Independent at baseline   Social Determinants of Health   Financial Resource Strain: Not on file  Food Insecurity: Not on file  Transportation Needs: Not on file  Physical Activity: Not on file  Stress: Not on file  Social Connections: Not on file  Intimate Partner Violence: Not on file     Constitutional: Denies fever, malaise, fatigue, headache or abrupt weight changes.  Respiratory: Denies difficulty breathing, shortness of breath, cough or sputum production.   Cardiovascular: Denies chest pain, chest tightness, palpitations or swelling in the hands or feet.  Musculoskeletal: Patient reports chronic muscle and joint pain.  Denies decrease in range of motion, difficulty with gait, or joint swelling.  Skin: Denies redness, rashes, lesions or ulcercations.  Neurological: Patient reports intermittent dizziness and difficulty with balance and coordination.  Denies difficulty with memory, difficulty with speech.    No other specific complaints in a complete review of systems (except as listed in HPI above).  Objective:   Physical Exam BP (!) 148/72 (BP Location: Left Arm, Patient Position: Sitting, Cuff Size: Large)     Pulse 62    Temp (!) 96.9 F (36.1 C) (Temporal)    Wt 210 lb (95.3 kg)    LMP  (LMP Unknown)    SpO2 98%    BMI 31.01 kg/m   Wt Readings from Last 3 Encounters:  08/17/21 205 lb (93 kg)  07/12/21 216 lb (98 kg)  06/22/21 219 lb 6.4 oz (99.5 kg)    General: Appears her stated age, obese, in NAD. HEENT: Head: normal shape and size; Eyes: EOMs intact;  Cardiovascular: Normal rate and rhythm.  Pulmonary/Chest: Normal effort and positive vesicular breath sounds.  Musculoskeletal:  No difficulty with gait.  Neurological: Alert and oriented. Coordination normal.     BMET    Component Value Date/Time   NA 142 06/22/2021 1450   NA 135 (L) 06/30/2013 1203   K 4.3 06/22/2021 1450   K 4.1 06/30/2013 1203   CL 104 06/22/2021 1450   CL 102 06/30/2013 1203   CO2 31 06/22/2021 1450   CO2 32 06/30/2013 1203   GLUCOSE 85 06/22/2021 1450   GLUCOSE 143 (H) 06/30/2013 1203   BUN 24 06/22/2021 1450   BUN 19 (H) 06/30/2013 1203   CREATININE 1.04 06/22/2021 1450   CALCIUM 9.1 06/22/2021 1450   CALCIUM 9.7 06/30/2013 1203   GFRNONAA 56 (L) 12/21/2020 0957   GFRAA 65 12/21/2020 0957    Lipid Panel     Component Value Date/Time   CHOL 98 06/22/2021 1450   TRIG 114 06/22/2021 1450   HDL 39 (L) 06/22/2021 1450   CHOLHDL 2.5 06/22/2021 1450   VLDL 31.6 03/12/2019 1255   LDLCALC 39 06/22/2021 1450    CBC  Component Value Date/Time   WBC 6.9 06/22/2021 1450   RBC 4.38 06/22/2021 1450   HGB 13.1 06/22/2021 1450   HGB 14.0 11/12/2011 1136   HCT 38.3 06/22/2021 1450   HCT 40.7 11/12/2011 1136   PLT 216 06/22/2021 1450   PLT 274 11/12/2011 1136   MCV 87.4 06/22/2021 1450   MCV 86 11/12/2011 1136   MCH 29.9 06/22/2021 1450   MCHC 34.2 06/22/2021 1450   RDW 12.2 06/22/2021 1450   RDW 12.8 11/12/2011 1136   LYMPHSABS 1.4 09/06/2020 1330   LYMPHSABS 2.5 11/12/2011 1136   MONOABS 0.5 09/06/2020 1330   MONOABS 0.8 11/12/2011 1136   EOSABS 0.2 09/06/2020 1330   EOSABS 0.6 11/12/2011  1136   BASOSABS 0.1 09/06/2020 1330   BASOSABS 0.1 11/12/2011 1136    Hgb A1C Lab Results  Component Value Date   HGBA1C 5.6 06/22/2021           Assessment & Plan:    [name], NP This visit occurred during the SARS-CoV-2 public health emergency.  Safety protocols were in place, including screening questions prior to the visit, additional usage of staff PPE, and extensive cleaning of exam room while observing appropriate contact time as indicated for disinfecting solutions.

## 2021-09-04 NOTE — Patient Instructions (Signed)
Orthostatic Hypotension Blood pressure is a measurement of how strongly, or weakly, your circulating blood is pressing against the walls of your arteries. Orthostatic hypotension is a drop in blood pressure that can happen when you change positions, such as when you go from lying down to standing. Arteries are blood vessels that carry blood from your heart throughout your body. When blood pressure is too low, you may not get enough blood to your brain or to the rest of your organs. Orthostatic hypotension can cause light-headedness, sweating, rapid heartbeat, blurred vision, and fainting. These symptoms require further investigation into the cause. What are the causes? Orthostatic hypotension can be caused by many things, including: Sudden changes in posture, such as standing up quickly after you have been sitting or lying down. Loss of blood (anemia) or loss of body fluids (dehydration). Heart problems, neurologic problems, or hormone problems. Pregnancy. Aging. The risk for this condition increases as you get older. Severe infection (sepsis). Certain medicines, such as medicines for high blood pressure or medicines that make the body lose excess fluids (diuretics). What are the signs or symptoms? Symptoms of this condition may include: Weakness, light-headedness, or dizziness. Sweating. Blurred vision. Tiredness (fatigue). Rapid heartbeat. Fainting, in severe cases. How is this diagnosed? This condition is diagnosed based on: Your symptoms and medical history. Your blood pressure measurements. Your health care provider will check your blood pressure when you are: Lying down. Sitting. Standing. A blood pressure reading is recorded as two numbers, such as "120 over 80" (or 120/80). The first ("top") number is called the systolic pressure. It is a measure of the pressure in your arteries as your heart beats. The second ("bottom") number is called the diastolic pressure. It is a measure of  the pressure in your arteries when your heart relaxes between beats. Blood pressure is measured in a unit called mmHg. Healthy blood pressure for most adults is 120/80 mmHg. Orthostatic hypotension is defined as a 20 mmHg drop in systolic pressure or a 10 mmHg drop in diastolic pressure within 3 minutes of standing. Other information or tests that may be used to diagnose orthostatic hypotension include: Your other vital signs, such as your heart rate and temperature. Blood tests. An electrocardiogram (ECG) or echocardiogram. A Holter monitor. This is a device you wear that records your heart rhythm continuously, usually for 24-48 hours. Tilt table test. For this test, you will be safely secured to a table that moves you from a lying position to an upright position. Your heart rhythm and blood pressure will be monitored during the test. How is this treated? This condition may be treated by: Changing your diet. This may involve eating more salt (sodium) or drinking more water. Changing the dosage of certain medicines you are taking that might be lowering your blood pressure. Correcting the underlying reason for the orthostatic hypotension. Wearing compression stockings. Taking medicines to raise your blood pressure. Avoiding actions that trigger symptoms. Follow these instructions at home: Medicines Take over-the-counter and prescription medicines only as told by your health care provider. Follow instructions from your health care provider about changing the dosage of your current medicines, if this applies. Do not stop or adjust any of your medicines on your own. Eating and drinking  Drink enough fluid to keep your urine pale yellow. Eat extra salt only as directed. Do not add extra salt to your diet unless advised by your health care provider. Eat frequent, small meals. Avoid standing up suddenly after eating. General instructions    Get up slowly from lying down or sitting positions. This  gives your blood pressure a chance to adjust. Avoid hot showers and excessive heat as directed by your health care provider. Engage in regular physical activity as directed by your health care provider. If you have compression stockings, wear them as told. Keep all follow-up visits. This is important. Contact a health care provider if: You have a fever for more than 2-3 days. You feel more thirsty than usual. You feel dizzy or weak. Get help right away if: You have chest pain. You have a fast or irregular heartbeat. You become sweaty or feel light-headed. You feel short of breath. You faint. You have any symptoms of a stroke. "BE FAST" is an easy way to remember the main warning signs of a stroke: B - Balance. Signs are dizziness, sudden trouble walking, or loss of balance. E - Eyes. Signs are trouble seeing or a sudden change in vision. F - Face. Signs are sudden weakness or numbness of the face, or the face or eyelid drooping on one side. A - Arms. Signs are weakness or numbness in an arm. This happens suddenly and usually on one side of the body. S - Speech. Signs are sudden trouble speaking, slurred speech, or trouble understanding what people say. T - Time. Time to call emergency services. Write down what time symptoms started. You have other signs of a stroke, such as: A sudden, severe headache with no known cause. Nausea or vomiting. Seizure. These symptoms may represent a serious problem that is an emergency. Do not wait to see if the symptoms will go away. Get medical help right away. Call your local emergency services (911 in the [place] Do not drive yourself to the hospital. Summary Orthostatic hypotension is a sudden drop in blood pressure. It can cause light-headedness, sweating, rapid heartbeat, blurred vision, and fainting. Orthostatic hypotension can be diagnosed by having your blood pressure taken while lying down, sitting, and then standing. Treatment may involve  changing your diet, wearing compression stockings, sitting up slowly, adjusting your medicines, or correcting the underlying reason for the orthostatic hypotension. Get help right away if you have chest pain, a fast or irregular heartbeat, or symptoms of a stroke. This information is not intended to replace advice given to you by your health care provider. Make sure you discuss any questions you have with your health care provider. Document Revised: 09/01/2020 Document Reviewed: 09/01/2020 Elsevier Patient Education  2022 [employer]  

## 2021-09-04 NOTE — Telephone Encounter (Signed)
Patient is calling to make sure her scheduled pump refill date of 09-28-21 is correct she was just here 08-17-21. Please check and advise ?

## 2021-09-04 NOTE — Assessment & Plan Note (Signed)
Encourage diet and exercise for weight loss 

## 2021-09-04 NOTE — Telephone Encounter (Signed)
Called to inform Patient that 09/28/21 was correct for her pump refill. Patient with understanding. ?

## 2021-09-05 ENCOUNTER — Other Ambulatory Visit: Payer: Self-pay | Admitting: Cardiology

## 2021-09-05 NOTE — Telephone Encounter (Signed)
Is this date correct ?  ?

## 2021-09-06 DIAGNOSIS — L97522 Non-pressure chronic ulcer of other part of left foot with fat layer exposed: Secondary | ICD-10-CM | POA: Diagnosis not present

## 2021-09-06 DIAGNOSIS — E1142 Type 2 diabetes mellitus with diabetic polyneuropathy: Secondary | ICD-10-CM | POA: Diagnosis not present

## 2021-09-06 NOTE — Telephone Encounter (Signed)
Yes this is correct, patient is aware.  ?

## 2021-09-08 ENCOUNTER — Encounter: Payer: Self-pay | Admitting: Internal Medicine

## 2021-09-14 ENCOUNTER — Other Ambulatory Visit: Payer: Self-pay | Admitting: Internal Medicine

## 2021-09-14 NOTE — Telephone Encounter (Signed)
Requested Prescriptions  ?Pending Prescriptions Disp Refills  ?? OZEMPIC, 0.25 OR 0.5 MG/DOSE, 2 MG/1.5ML SOPN [Pharmacy Med Name: OZEMPIC 0.25-0.5 MG/DOSE PEN]    ?  Sig: INJECT 0.5 MG INTO THE SKIN ONCE A WEEK. FOR FIRST 4 WEEKS. THEN INCREASE DOSE TO 0.'5MG'$  WEEKLY  ?  ? Endocrinology:  Diabetes - GLP-1 Receptor Agonists - semaglutide Passed - 09/14/2021  2:15 AM  ?  ?  Passed - HBA1C in normal range and within 180 days  ?  Hgb A1c MFr Bld  ?Date Value Ref Range Status  ?06/22/2021 5.6 <5.7 % of total Hgb Final  ?  Comment:  ?  For the purpose of screening for the presence of ?diabetes: ?. ?<5.7%       Consistent with the absence of diabetes ?5.7-6.4%    Consistent with increased risk for diabetes ?            (prediabetes) ?> or =6.5%  Consistent with diabetes ?Marland Kitchen ?This assay result is consistent with a decreased risk ?of diabetes. ?. ?Currently, no consensus exists regarding use of ?hemoglobin A1c for diagnosis of diabetes in children. ?. ?According to American Diabetes Association (ADA) ?guidelines, hemoglobin A1c <7.0% represents optimal ?control in non-pregnant diabetic patients. Different ?metrics may apply to specific patient populations.  ?Standards of Medical Care in Diabetes(ADA). ?. ?  ?   ?  ?  Passed - Cr in normal range and within 360 days  ?  Creat  ?Date Value Ref Range Status  ?06/22/2021 1.04 0.50 - 1.05 mg/dL Final  ? ?Creatinine,U  ?Date Value Ref Range Status  ?03/12/2019 131.0 mg/dL Final  ? ?Creatinine, Urine  ?Date Value Ref Range Status  ?12/21/2020 137 20 - 275 mg/dL Final  ?   ?  ?  Passed - Valid encounter within last 6 months  ?  Recent Outpatient Visits   ?      ? 1 week ago Orthostasis  ? [facility] [place], [place], NP  ? 2 months ago Type 2 diabetes mellitus with diabetic mononeuropathy, without long-term current use of insulin ([place])  ? [place], NP  ? 3 months ago Stuffy and runny nose  ? [facility] [place], [place],  NP  ? 8 months ago Type 2 diabetes mellitus with diabetic mononeuropathy, without long-term current use of insulin ([place])  ? [facility] [place], [place]alie Keens, NP  ?  ?  ?Future Appointments   ?        ? In 2 weeks Liliane Shi, PA-C [place] [facility], [facility]  ?  ? ?  ?  ?  ? ?

## 2021-09-18 ENCOUNTER — Telehealth: Payer: Self-pay

## 2021-09-18 ENCOUNTER — Other Ambulatory Visit: Payer: Self-pay | Admitting: Podiatry

## 2021-09-18 DIAGNOSIS — G894 Chronic pain syndrome: Secondary | ICD-10-CM | POA: Diagnosis not present

## 2021-09-18 DIAGNOSIS — M216X2 Other acquired deformities of left foot: Secondary | ICD-10-CM | POA: Diagnosis not present

## 2021-09-18 DIAGNOSIS — M2041 Other hammer toe(s) (acquired), right foot: Secondary | ICD-10-CM | POA: Diagnosis not present

## 2021-09-18 DIAGNOSIS — M2042 Other hammer toe(s) (acquired), left foot: Secondary | ICD-10-CM | POA: Diagnosis not present

## 2021-09-18 DIAGNOSIS — E1142 Type 2 diabetes mellitus with diabetic polyneuropathy: Secondary | ICD-10-CM | POA: Diagnosis not present

## 2021-09-18 DIAGNOSIS — Q667 Congenital pes cavus, unspecified foot: Secondary | ICD-10-CM | POA: Diagnosis not present

## 2021-09-18 DIAGNOSIS — M2032 Hallux varus (acquired), left foot: Secondary | ICD-10-CM | POA: Diagnosis not present

## 2021-09-18 DIAGNOSIS — M216X1 Other acquired deformities of right foot: Secondary | ICD-10-CM | POA: Diagnosis not present

## 2021-09-18 DIAGNOSIS — L97522 Non-pressure chronic ulcer of other part of left foot with fat layer exposed: Secondary | ICD-10-CM | POA: Diagnosis not present

## 2021-09-18 NOTE — Telephone Encounter (Signed)
I had to move her pump refill to 3/28 because she is having surgery on 3/30  ?

## 2021-09-18 NOTE — Telephone Encounter (Signed)
? ?  Pre-operative Risk Assessment  ?  ?Patient Name: Melinda [name]  ?DOB: 07-Feb-1956 ?MRN: [id number]  ? ?  ? ?Request for Surgical Clearance   ? ?Procedure:   left interphalangeal joint hallux arthroplasty ? ?Date of Surgery:  Clearance 09/28/21                              ?   ?Surgeon:  not listed ?Surgeon's Group or Practice Name:  [facility] ?Phone number:  [phone] ?Fax number:  [phone] ?  ?Type of Clearance Requested:   ?- Medical  ?- Pharmacy:  Hold Aspirin -not indicated ?  ?Type of Anesthesia:  Not Indicated ?  ?Additional requests/questions:     ? ?Signed, ?Tomoya Ringwald   ?09/18/2021, 5:05 PM  ? ?

## 2021-09-18 NOTE — Telephone Encounter (Signed)
Cyndi notified and script changed to 09-26-2021.  ?

## 2021-09-19 ENCOUNTER — Telehealth: Payer: Self-pay

## 2021-09-19 NOTE — Telephone Encounter (Signed)
Pt scheduled for Tele-visit with our Pre-op team on 09/20/21 for surgical clearance. ?

## 2021-09-19 NOTE — Telephone Encounter (Signed)
?  Patient Consent for Virtual Visit  ? ? ?   ? ?Melinda [name] has provided verbal consent on 09/19/2021 for a virtual visit (video or telephone). ? ? ?CONSENT FOR VIRTUAL VISIT FOR:  Melinda [name]  ?By participating in this virtual visit I agree to the following: ? ?I hereby voluntarily request, consent and authorize [facility] and its employed or contracted physicians, physician assistants, nurse practitioners or other licensed health care professionals (the Practitioner), to provide me with telemedicine health care services (the ?Services") as deemed necessary by the treating Practitioner. I acknowledge and consent to receive the Services by the Practitioner via telemedicine. I understand that the telemedicine visit will involve communicating with the Practitioner through live audiovisual communication technology and the disclosure of certain medical information by electronic transmission. I acknowledge that I have been given the opportunity to request an in-person assessment or other available alternative prior to the telemedicine visit and am voluntarily participating in the telemedicine visit. ? ?I understand that I have the right to withhold or withdraw my consent to the use of telemedicine in the course of my care at any time, without affecting my right to future care or treatment, and that the Practitioner or I may terminate the telemedicine visit at any time. I understand that I have the right to inspect all information obtained and/or recorded in the course of the telemedicine visit and may receive copies of available information for a reasonable fee.  I understand that some of the potential risks of receiving the Services via telemedicine include:  ?Delay or interruption in medical evaluation due to technological equipment failure or disruption; ?Information transmitted may not be sufficient (e.g. poor resolution of images) to allow for appropriate medical decision making by the Practitioner; and/or   ?In rare instances, security protocols could fail, causing a breach of personal health information. ? ?Furthermore, I acknowledge that it is my responsibility to provide information about my medical history, conditions and care that is complete and accurate to the best of my ability. I acknowledge that Practitioner's advice, recommendations, and/or decision may be based on factors not within their control, such as incomplete or inaccurate data provided by me or distortions of diagnostic images or specimens that may result from electronic transmissions. I understand that the practice of medicine is not an exact science and that Practitioner makes no warranties or guarantees regarding treatment outcomes. I acknowledge that a copy of this consent can be made available to me via my patient portal ([facility]), or I can request a printed copy by calling the office of [facility].   ? ?I understand that my insurance will be billed for this visit.  ? ?I have read or had this consent read to me. ?I understand the contents of this consent, which adequately explains the benefits and risks of the Services being provided via telemedicine.  ?I have been provided ample opportunity to ask questions regarding this consent and the Services and have had my questions answered to my satisfaction. ?I give my informed consent for the services to be provided through the use of telemedicine in my medical care ? ? ? ?

## 2021-09-19 NOTE — Telephone Encounter (Signed)
? ? ?  Name: Melinda [name]  ?DOB: [birth year]  ?MRN: [id number] ? ?Primary Cardiologist: Candee Furbish, MD ? ? ?Preoperative team, please contact this patient and set up a phone call appointment for further preoperative risk assessment. Please obtain consent and complete medication review. Thank you for your help. ? ? ?ASA addressed in prior note. ? ? ?Ledora Bottcher, PA-C ?09/19/2021, 6:47 AM ?[phone] ?[facility] ?[address] ?[place],  [address] ? ? ?

## 2021-09-20 ENCOUNTER — Telehealth: Payer: Medicare Other

## 2021-09-20 ENCOUNTER — Other Ambulatory Visit: Payer: Self-pay

## 2021-09-21 ENCOUNTER — Ambulatory Visit (INDEPENDENT_AMBULATORY_CARE_PROVIDER_SITE_OTHER): Payer: Medicare Other | Admitting: General Practice

## 2021-09-21 ENCOUNTER — Other Ambulatory Visit: Payer: Self-pay

## 2021-09-21 ENCOUNTER — Encounter: Payer: Self-pay | Admitting: Podiatry

## 2021-09-21 DIAGNOSIS — Z01810 Encounter for preprocedural cardiovascular examination: Secondary | ICD-10-CM

## 2021-09-21 NOTE — Progress Notes (Signed)
? ?Virtual Visit via Telephone Note  ? ?This visit type was conducted due to national recommendations for restrictions regarding the COVID-19 Pandemic (e.g. social distancing) in an effort to limit this patient's exposure and mitigate transmission in our community.  Due to her co-morbid illnesses, this patient is at least at moderate risk for complications without adequate follow up.  This format is felt to be most appropriate for this patient at this time.  The patient did not have access to video technology/had technical difficulties with video requiring transitioning to audio format only (telephone).  All issues noted in this document were discussed and addressed.  No physical exam could be performed with this format.  Please refer to the patient's chart for her  consent to telehealth for [facility] ?Evaluation Performed:  Preoperative cardiovascular risk assessment ? ?This visit type was conducted due to national recommendations for restrictions regarding the COVID-19 Pandemic (e.g. social distancing).  This format is felt to be most appropriate for this patient at this time.  All issues noted in this document were discussed and addressed.  No physical exam was performed (except for noted visual exam findings with Video Visits).  Please refer to the patient's chart (MyChart message for video visits and phone note for telephone visits) for the patient's consent to telehealth for [facility]. ?_____________  ? ?Date:  09/21/2021  ? ?Patient ID:  Melinda [name], DOB 25-Jun-1956, MRN [id number] ?Patient Location:  ?Home ?Provider location:   ?Office ? ?Primary Care Provider:  Jearld Fenton, NP ?Primary Cardiologist:  Candee Furbish, MD ? ?Chief Complaint  ?  ?66 y.o. y/o female with a h/o coronary artery disease, OSA, IBS, type 2 diabetes, fibromyalgia, who is pending left interphalangeal joint hallux arthroplasty, and presents today for telephonic preoperative cardiovascular risk assessment. ? ?Past Medical  History  ?  ?Past Medical History:  ?Diagnosis Date  ? 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 Amputation of second toe, right, traumatic ([place]) 09/2017  ? Anginal pain ([place])   ? Anxiety   ? Blue toes   ? 2nd toe on right foot, will get appt.  ? Bulging disc   ? CAD (coronary artery disease) 2009  ? s/p stent to LAD  ? 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 Chicken pox   ? Chronic fatigue   ? Chronic kidney disease   ? "problem with kidney filtration"  ? Coronary arteritis   ? Degenerative disc disease   ? Back, neck, hands, knees  ? Depression   ? Diabetes mellitus without complication ([place])   ? Facet joint disease   ? Fibromyalgia   ? Heart disease   ? Hyperlipidemia   ? IBS (irritable bowel syndrome)   ? MCL deficiency, knee   ? MRSA (methicillin resistant staph aureus) culture positive 2011  ? GREAT TOE RIGHT FOOT  ? Neuropathy 04/18/2010  ? Orthostatic hypotension 01/2020  ? Peripheral neuropathy   ? Renal insufficiency   ? Restless leg syndrome   ? Sepsis ([place]) 09/2013  ? Sleep apnea 08/17/2003  ? uses CPAP, sleep study at [facility] (mild to moderate)  ? Spinal stenosis   ? ?Past Surgical History:  ?Procedure Laterality Date  ? ABLATION  2007  ? UTERUS  ? ABLATION    ? HEART  ? AMPUTATION TOE Right 02/01/2017  ? Procedure: AMPUTATION TOE-RIGHT 2ND MPJ;  Surgeon: Albertine Patricia, DPM;  Location: [facility];  Service: Podiatry;  Laterality: Right;  ? ANTERIOR CERVICAL DECOMP/DISCECTOMY FUSION N/A 07/28/2014  ? Procedure: ANTERIOR CERVICAL DECOMPRESSION/DISCECTOMY FUSION CERVICAL 3-4,4-5,5-6 LEVELS WITH INSTRUMENTATION AND ALLOGRAFT;  Surgeon: Sinclair Ship, MD;  Location: [place];  Service: Orthopedics;  Laterality: N/A;  Anterior cervical decompression fusion, cervical 3-4, cervical 4-5, cervical 5-6 with instrumentation and allograft  ? BACK SURGERY    ? X 3 1979, 1994, 1995  ? CARPAL TUNNEL RELEASE Right   ?  CHOLECYSTECTOMY  2003  ? CORONARY ANGIOPLASTY  2008  ? EYE SURGERY Bilateral 2013  ? Eyelid lift   ? FOOT SURGERY Right   ? BIG TOE  ? FOOT SURGERY Bilateral   ? PLANTAR FASCIITIS  ? FOOT SURGERY Right   ? 2ND TOE  ? GALLBLADDER SURGERY    ? HAMMER TOE SURGERY Right 10/17/2017  ? Procedure: HAMMER TOE CORRECTION-4TH TOE;  Surgeon: Albertine Patricia, DPM;  Location: [place];  Service: Podiatry;  Laterality: Right;  LMA- WITH LOCAL ?Diabetic - diet controlled  ? HAND SURGERY Left   ? HAND SURGEY Left   ? HEART STENT  2009  ? LAD  ? INFUSION PUMP IMPLANTATION    ? X2 with morphine and baclofen  ? INTRATHECAL PUMP REVISION N/A 04/25/2018  ? Procedure: Intrathecal pump replacement;  Surgeon: Clydell Hakim, MD;  Location: [place];  Service: Neurosurgery;  Laterality: N/A;  right  ? INTRATHECAL PUMP REVISION Right 04/25/2018  ? INTRATHECAL PUMP REVISION N/A 12/12/2018  ? Procedure: Intrathecal pump revision with exploration of pocket;  Surgeon: Clydell Hakim, MD;  Location: [place];  Service: Neurosurgery;  Laterality: N/A;  Intrathecal pump revision with exploration of pocket  ? INTRATHECAL PUMP REVISION N/A 03/11/2020  ? Procedure: INTRATHECAL PUMP REVISION;  Surgeon: [name], MD;  Location: [facility];  Service: Neurosurgery;  Laterality: N/A;  ? IRRIGATION AND DEBRIDEMENT FOOT Right 02/23/2017  ? Procedure: IRRIGATION AND DEBRIDEMENT FOOT-right foot;  Surgeon: Samara Deist, DPM;  Location: [facility];  Service: Podiatry;  Laterality: Right;  ? KNEE SURGERY Right 05/24/2020  ? PAIN PUMP REVISION N/A 08/15/2018  ? Procedure: Intrathecal PUMP REVISION;  Surgeon: Clydell Hakim, MD;  Location: [place];  Service: Neurosurgery;  Laterality: N/A;  INTRATHECAL PUMP REVISION  ? TOE SURGERY    ? then revision 8/18  ? TOTAL KNEE ARTHROPLASTY Right 05/24/2020  ? Procedure: RIGHT TOTAL KNEE ARTHROPLASTY;  Surgeon: Melrose Nakayama, MD;  Location: [facility];  Service: Orthopedics;  Laterality: Right;  ? TOTAL KNEE REVISION  Right 09/06/2020  ? : RIGHT KNEE POLY REVISION;  Melrose Nakayama, MD) PLAN TO DISCHARGE FROM PACU  ? ? ?Allergies ? ?No Known Allergies ? ?History of Present Illness  ?  ?Melinda [name] is a 66 y.o. female who presents via audio/video conferencing for a telehealth visit today.  Pt was last seen in cardiology clinic on 07/12/2021, by Dr. Marlou Porch.  At that time Melinda [name] was experiencing hypotension.  Midodrine 3 times daily was prescribed.  she is now pending left interphalangeal joint hallux arthroplasty.  Since her last visit, she remains stable from a cardiac standpoint ? ? ?Today she denies chest pain, shortness of breath, lower extremity edema, fatigue, palpitations, melena, hematuria, hemoptysis, diaphoresis, weakness, presyncope, syncope, orthopnea, and PND. ? ? ?Home Medications  ?  ?Prior to Admission medications   ?Medication Sig Start Date End Date Taking? Authorizing Provider  ?ACCU-CHEK GUIDE test strip USE 1 EACH BY OTHER ROUTE 3 (THREE) TIMES DAILY. USE AS  INSTRUCTE 02/10/21   Jearld Fenton, NP  ?Accu-Chek Softclix Lancets lancets USE TO CHECK BLOOD SUGAR 1 TIME DAILY 08/04/19   Jearld Fenton, NP  ?AMBULATORY NON FORMULARY MEDICATION Medication Name: CPAP MASK OF CHOICE FOR HOME DEVICE 07/01/15   Laverle Hobby, MD  ?Ascorbic Acid (VITAMIN C) 1000 MG tablet Take 1,000 mg by mouth daily.    [provider]  ?ASPIRIN 81 PO Take 81 mg by mouth daily.    [provider]  ?b complex vitamins tablet Take 1 tablet by mouth in the morning.    [provider]  ?Biotin 10000 MCG TABS Take 10,000 mcg by mouth daily.    [provider]  ?Blood Glucose Calibration (ACCU-CHEK GUIDE CONTROL) LIQD 1 each by In Vitro route as needed. 09/19/20   Jearld Fenton, NP  ?buPROPion (WELLBUTRIN XL) 150 MG 24 hr tablet Take 1 tablet (150 mg total) by mouth daily. 08/14/21   Jearld Fenton, NP  ?CALCIUM-MAGNESIUM-ZINC PO Take 3 capsules by mouth daily.    [provider]   ?Cholecalciferol (VITAMIN D) 50 MCG (2000 UT) tablet Take 2,000 Units by mouth daily.    [provider]  ?CINNAMON PO Take 1,000 mg by mouth daily.    [provider]  ?ezetimibe (ZETIA)

## 2021-09-21 NOTE — Discharge Instructions (Signed)
[facility]  POST OPERATIVE INSTRUCTIONS FOR DR. FOWLER AND [facility] PODI[facility]PART[facility]   Take your medication as prescribed.  Pain medication should be taken only as needed.  Keep the dressing clean, dry and intact.  Keep your foot elevated above the heart level for the first 48 hours.  Walking to the bathroom and brief periods of walking are acceptable, unless we have instructed you to be non-weight bearing.  Always wear your post-op shoe when walking.  Always use your crutches if you are to be non-weight bearing.  Do not take a shower. Baths are permissible as long as the foot is kept out of the water.   Every hour you are awake:  Bend your knee 15 times. Flex foot 15 times Massage calf 15 times  Call [facility] ([phone]) if any of the following problems occur: You develop a temperature or fever. The bandage becomes saturated with blood. Medication does not stop your pain. Injury of the foot occurs. Any symptoms of infection including redness, odor, or red streaks running from wound. 

## 2021-09-22 ENCOUNTER — Other Ambulatory Visit: Payer: Self-pay

## 2021-09-22 ENCOUNTER — Encounter: Payer: Self-pay | Admitting: Physician Assistant

## 2021-09-22 ENCOUNTER — Ambulatory Visit (INDEPENDENT_AMBULATORY_CARE_PROVIDER_SITE_OTHER): Payer: Medicare Other | Admitting: Physician Assistant

## 2021-09-22 VITALS — BP 126/82 | HR 74 | Ht 68.0 in | Wt 207.2 lb

## 2021-09-22 DIAGNOSIS — Z01818 Encounter for other preprocedural examination: Secondary | ICD-10-CM | POA: Diagnosis not present

## 2021-09-22 NOTE — Progress Notes (Signed)
? ?Established Patient Office Visit ? ?Subjective:  ?Patient ID: Melinda [name], female    DOB: [birth year]  Age: 66 y.o. MRN: [id number] ? ?Today's Provider: Talitha Givens, MHS, PA-C ?Introduced myself to the patient as a Journalist, newspaper and provided education on APPs in clinical practice.  ? ? ?CC:  ?Chief Complaint  ?Patient presents with  ? surgical clearance  ? Toe Pain  ? ? ?HPI ?Melinda [name] presents for  surgical clearance paperwork for upcoming foot surgery ? ?She denies recent illnesses, chest pain, palpitations, hospitalizations ?She is mildly frustrated about having to have so many apts for surgical clearance and is eager to have this be final one ? ? ?Past Medical History:  ?Diagnosis Date  ? 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 Amputation of second toe, right, traumatic ([place]) 09/2017  ? Anginal pain ([place])   ? Anxiety   ? Blue toes   ? 2nd toe on right foot, will get appt.  ? Bulging disc   ? CAD (coronary artery disease) 2009  ? s/p stent to LAD  ? 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 Chicken pox   ? Chronic fatigue   ? Chronic kidney disease   ? "problem with kidney filtration"  ? Coronary arteritis   ? Degenerative disc disease   ? Back, neck, hands, knees  ? Depression   ? Diabetes mellitus without complication ([place])   ? Facet joint disease   ? Fibromyalgia   ? Heart disease   ? Hyperlipidemia   ? IBS (irritable bowel syndrome)   ? MCL deficiency, knee   ? MRSA (methicillin resistant staph aureus) culture positive 2011  ? GREAT TOE RIGHT FOOT  ? Neuropathy 04/18/2010  ? Orthostatic hypotension 01/2020  ? Peripheral neuropathy   ? Renal insufficiency   ? Restless leg syndrome   ? Sepsis ([place]) 09/2013  ? Sleep apnea 08/17/2003  ? uses CPAP, sleep study at [facility] (mild to moderate)  ? Spinal stenosis   ? ? ?Past Surgical History:  ?Procedure Laterality Date  ? ABLATION  2007  ? UTERUS  ? ABLATION    ? HEART  ?  AMPUTATION TOE Right 02/01/2017  ? Procedure: AMPUTATION TOE-RIGHT 2ND MPJ;  Surgeon: Albertine Patricia, DPM;  Location: [facility];  Service: Podiatry;  Laterality: Right;  ? ANTERIOR CERVICAL DECOMP/DISCECTOMY FUSION N/A 07/28/2014  ? Procedure: ANTERIOR CERVICAL DECOMPRESSION/DISCECTOMY FUSION CERVICAL 3-4,4-5,5-6 LEVELS WITH INSTRUMENTATION AND ALLOGRAFT;  Surgeon: Sinclair Ship, MD;  Location: [place];  Service: Orthopedics;  Laterality: N/A;  Anterior cervical decompression fusion, cervical 3-4, cervical 4-5, cervical 5-6 with instrumentation and allograft  ? BACK SURGERY    ? X 3 1979, 1994, 1995  ? CARPAL TUNNEL RELEASE Right   ? CHOLECYSTECTOMY  2003  ? CORONARY ANGIOPLASTY  2008  ? EYE SURGERY Bilateral 2013  ? Eyelid lift   ? FOOT SURGERY Right   ? BIG TOE  ? FOOT SURGERY Bilateral   ? PLANTAR FASCIITIS  ? FOOT SURGERY Right   ? 2ND TOE  ? GALLBLADDER SURGERY    ? HAMMER TOE SURGERY Right 10/17/2017  ? Procedure: HAMMER TOE CORRECTION-4TH TOE;  Surgeon: Albertine Patricia, DPM;  Location: [place];  Service: Podiatry;  Laterality: Right;  LMA- WITH LOCAL ?Diabetic - diet controlled  ? HAND SURGERY Left   ? HAND SURGEY Left   ? HEART STENT  2009  ?  LAD  ? INFUSION PUMP IMPLANTATION    ? X2 with morphine and baclofen  ? INTRATHECAL PUMP REVISION N/A 04/25/2018  ? Procedure: Intrathecal pump replacement;  Surgeon: Clydell Hakim, MD;  Location: [place];  Service: Neurosurgery;  Laterality: N/A;  right  ? INTRATHECAL PUMP REVISION Right 04/25/2018  ? INTRATHECAL PUMP REVISION N/A 12/12/2018  ? Procedure: Intrathecal pump revision with exploration of pocket;  Surgeon: Clydell Hakim, MD;  Location: [place];  Service: Neurosurgery;  Laterality: N/A;  Intrathecal pump revision with exploration of pocket  ? INTRATHECAL PUMP REVISION N/A 03/11/2020  ? Procedure: INTRATHECAL PUMP REVISION;  Surgeon: [name], MD;  Location: [facility];  Service: Neurosurgery;  Laterality: N/A;  ? IRRIGATION AND  DEBRIDEMENT FOOT Right 02/23/2017  ? Procedure: IRRIGATION AND DEBRIDEMENT FOOT-right foot;  Surgeon: Samara Deist, DPM;  Location: [facility];  Service: Podiatry;  Laterality: Right;  ? KNEE SURGERY Right 05/24/2020  ? PAIN PUMP REVISION N/A 08/15/2018  ? Procedure: Intrathecal PUMP REVISION;  Surgeon: Clydell Hakim, MD;  Location: [place];  Service: Neurosurgery;  Laterality: N/A;  INTRATHECAL PUMP REVISION  ? TOE SURGERY    ? then revision 8/18  ? TOTAL KNEE ARTHROPLASTY Right 05/24/2020  ? Procedure: RIGHT TOTAL KNEE ARTHROPLASTY;  Surgeon: Melrose Nakayama, MD;  Location: [facility];  Service: Orthopedics;  Laterality: Right;  ? TOTAL KNEE REVISION Right 09/06/2020  ? : RIGHT KNEE POLY REVISION;  Melrose Nakayama, MD) PLAN TO DISCHARGE FROM PACU  ? ? ?Family History  ?Problem Relation Age of Onset  ? Cancer Mother   ? Lung cancer Mother   ? Arthritis Father   ? Heart disease Father   ? Diabetes Father   ? Dementia Maternal Grandmother   ? Alcohol abuse Paternal Grandfather   ? Heart disease Paternal Grandfather   ? Stroke Paternal Grandfather   ? ? ?Social History  ? ?Socioeconomic History  ? Marital status: Significant Other  ?  Spouse name: Not on file  ? Number of children: Not on file  ? Years of education: Not on file  ? Highest education level: Not on file  ?Occupational History  ? Not on file  ?Tobacco Use  ? Smoking status: Former  ?  Packs/day: 1.50  ?  Years: 32.00  ?  Pack years: 48.00  ?  Types: Cigarettes  ?  Quit date: 07/22/2007  ?  Years since quitting: 14.1  ? Smokeless tobacco: Never  ?Vaping Use  ? Vaping Use: Never used  ?Substance and Sexual Activity  ? Alcohol use: Yes  ?  Comment: occ - Holidays  ? Drug use: Never  ?  Comment: prescribed pain pump and oxy  ? Sexual activity: Yes  ?Other Topics Concern  ? Not on file  ?Social History Narrative  ? Lives at home with a partner. Independent at baseline  ? ?Social Determinants of Health  ? ?Financial Resource Strain: Not on file  ?Food Insecurity: Not  on file  ?Transportation Needs: Not on file  ?Physical Activity: Not on file  ?Stress: Not on file  ?Social Connections: Not on file  ?Intimate Partner Violence: Not on file  ? ? ?Outpatient Medications Prior to Visit  ?Medication Sig Dispense Refill  ? ACCU-CHEK GUIDE test strip USE 1 EACH BY OTHER ROUTE 3 (THREE) TIMES DAILY. USE AS INSTRUCTE 200 strip 1  ? Accu-Chek Softclix Lancets lancets USE TO CHECK BLOOD SUGAR 1 TIME DAILY 100 each 2  ? AMBULATORY NON FORMULARY MEDICATION Medication Name: CPAP  MASK OF CHOICE FOR HOME DEVICE 1 each 0  ? Ascorbic Acid (VITAMIN C) 1000 MG tablet Take 1,000 mg by mouth daily.    ? ASPIRIN 81 PO Take 81 mg by mouth daily.    ? b complex vitamins tablet Take 1 tablet by mouth in the morning.    ? Biotin 10000 MCG TABS Take 10,000 mcg by mouth daily.    ? Blood Glucose Calibration (ACCU-CHEK GUIDE CONTROL) LIQD 1 each by In Vitro route as needed. 1 each 1  ? buPROPion (WELLBUTRIN XL) 150 MG 24 hr tablet Take 1 tablet (150 mg total) by mouth daily. 90 tablet 1  ? CALCIUM-MAGNESIUM-ZINC PO Take 3 capsules by mouth daily.    ? Cholecalciferol (VITAMIN D) 50 MCG (2000 UT) tablet Take 2,000 Units by mouth daily.    ? CINNAMON PO Take 1,000 mg by mouth daily.    ? ezetimibe (ZETIA) 10 MG tablet TAKE 1 TABLET BY MOUTH EVERY DAY 90 tablet 1  ? fludrocortisone (FLORINEF) 0.1 MG tablet Take 1 tablet (0.1 mg total) by mouth daily. 90 tablet 3  ? glucosamine-chondroitin 500-400 MG tablet Take 2 tablets by mouth daily.     ? Insulin Pen Needle 31G X 5 MM MISC BD Pen Needles- brand specific Inject insulin via insulin pen 6 x daily 100 each 2  ? Magnesium 400 MG CAPS Take 400 mg by mouth at bedtime.    ? midodrine (PROAMATINE) 5 MG tablet Take 1 tablet (5 mg total) by mouth 2 (two) times daily with a meal. 180 tablet 0  ? nitroGLYCERIN (NITROSTAT) 0.4 MG SL tablet PLACE 1 TABLET UNDER THE TONGUE EVERY 5 (FIVE) MINUTES AS NEEDED FOR CHEST PAIN. 25 tablet 4  ? NONFORMULARY OR COMPOUNDED ITEM 212.8  mcg by Epidural Infusion route. Medtronic Neuromodulation pump  ?Fentanyl 1,000.0 mcg/ml ?Baclofen 240.0 mcg/ml ?Bupivacaine 20.0 mg/ml ?Total daily dose 269.6 mcg/day    ? Omega-3 Fatty Acids (FISH OIL) 1200

## 2021-09-26 ENCOUNTER — Ambulatory Visit: Payer: Medicare Other | Attending: Pain Medicine | Admitting: Pain Medicine

## 2021-09-26 ENCOUNTER — Encounter: Payer: Self-pay | Admitting: Pain Medicine

## 2021-09-26 ENCOUNTER — Other Ambulatory Visit: Payer: Self-pay

## 2021-09-26 VITALS — BP 118/67 | HR 70 | Temp 97.3°F | Resp 16 | Ht 69.0 in | Wt 204.0 lb

## 2021-09-26 DIAGNOSIS — F1190 Opioid use, unspecified, uncomplicated: Secondary | ICD-10-CM | POA: Diagnosis present

## 2021-09-26 DIAGNOSIS — Z79891 Long term (current) use of opiate analgesic: Secondary | ICD-10-CM

## 2021-09-26 DIAGNOSIS — M25552 Pain in left hip: Secondary | ICD-10-CM | POA: Insufficient documentation

## 2021-09-26 DIAGNOSIS — M47816 Spondylosis without myelopathy or radiculopathy, lumbar region: Secondary | ICD-10-CM

## 2021-09-26 DIAGNOSIS — G96198 Other disorders of meninges, not elsewhere classified: Secondary | ICD-10-CM

## 2021-09-26 DIAGNOSIS — M961 Postlaminectomy syndrome, not elsewhere classified: Secondary | ICD-10-CM

## 2021-09-26 DIAGNOSIS — M51379 Other intervertebral disc degeneration, lumbosacral region without mention of lumbar back pain or lower extremity pain: Secondary | ICD-10-CM

## 2021-09-26 DIAGNOSIS — M5441 Lumbago with sciatica, right side: Secondary | ICD-10-CM | POA: Insufficient documentation

## 2021-09-26 DIAGNOSIS — Z978 Presence of other specified devices: Secondary | ICD-10-CM

## 2021-09-26 DIAGNOSIS — G894 Chronic pain syndrome: Secondary | ICD-10-CM

## 2021-09-26 DIAGNOSIS — M542 Cervicalgia: Secondary | ICD-10-CM

## 2021-09-26 DIAGNOSIS — Z451 Encounter for adjustment and management of infusion pump: Secondary | ICD-10-CM

## 2021-09-26 DIAGNOSIS — M5137 Other intervertebral disc degeneration, lumbosacral region: Secondary | ICD-10-CM | POA: Diagnosis not present

## 2021-09-26 DIAGNOSIS — M25551 Pain in right hip: Secondary | ICD-10-CM | POA: Insufficient documentation

## 2021-09-26 DIAGNOSIS — Z79899 Other long term (current) drug therapy: Secondary | ICD-10-CM

## 2021-09-26 DIAGNOSIS — G8929 Other chronic pain: Secondary | ICD-10-CM

## 2021-09-26 DIAGNOSIS — M533 Sacrococcygeal disorders, not elsewhere classified: Secondary | ICD-10-CM | POA: Diagnosis not present

## 2021-09-26 MED ORDER — OXYCODONE-ACETAMINOPHEN 5-325 MG PO TABS: 1.0000 | ORAL_TABLET | Freq: Three times a day (TID) | ORAL | 0 refills | Status: DC | PRN

## 2021-09-26 NOTE — Patient Instructions (Signed)
____________________________________________________________________________________________ ? ?Medication Rules ? ?Purpose: To inform patients, and their family members, of our rules and regulations. ? ?Applies to: All patients receiving prescriptions (written or electronic). ? ?Pharmacy of record: Pharmacy where electronic prescriptions will be sent. If written prescriptions are taken to a different pharmacy, please inform the nursing staff. The pharmacy listed in the electronic medical record should be the one where you would like electronic prescriptions to be sent. ? ?Electronic prescriptions: In compliance with the [place] Strengthen Opioid Misuse Prevention (STOP) Act of 2017 (Session Law 2017-74/H243), effective July 02, 2018, all controlled substances must be electronically prescribed. Calling prescriptions to the pharmacy will cease to exist. ? ?Prescription refills: Only during scheduled appointments. Applies to all prescriptions. ? ?NOTE: The following applies primarily to controlled substances (Opioid* Pain Medications).  ? ?Type of encounter (visit): For patients receiving controlled substances, face-to-face visits are required. (Not an option or up to the patient.) ? ?Patient's responsibilities: ?Pain Pills: Bring all pain pills to every appointment (except for procedure appointments). ?Pill Bottles: Bring pills in original pharmacy bottle. Always bring the newest bottle. Bring bottle, even if empty. ?Medication refills: You are responsible for knowing and keeping track of what medications you take and those you need refilled. ?The day before your appointment: write a list of all prescriptions that need to be refilled. ?The day of the appointment: give the list to the admitting nurse. Prescriptions will be written only during appointments. No prescriptions will be written on procedure days. ?If you forget a medication: it will not be "Called in", "Faxed", or "electronically sent". You will  need to get another appointment to get these prescribed. ?No early refills. Do not call asking to have your prescription filled early. ?Prescription Accuracy: You are responsible for carefully inspecting your prescriptions before leaving our office. Have the discharge nurse carefully go over each prescription with you, before taking them home. Make sure that your name is accurately spelled, that your address is correct. Check the name and dose of your medication to make sure it is accurate. Check the number of pills, and the written instructions to make sure they are clear and accurate. Make sure that you are given enough medication to last until your next medication refill appointment. ?Taking Medication: Take medication as prescribed. When it comes to controlled substances, taking less pills or less frequently than prescribed is permitted and encouraged. ?Never take more pills than instructed. ?Never take medication more frequently than prescribed.  ?Inform other Doctors: Always inform, all of your healthcare providers, of all the medications you take. ?Pain Medication from other Providers: You are not allowed to accept any additional pain medication from any other Doctor or Healthcare provider. There are two exceptions to this rule. (see below) In the event that you require additional pain medication, you are responsible for notifying us, as stated below. ?Cough Medicine: Often these contain an opioid, such as codeine or hydrocodone. Never accept or take cough medicine containing these opioids if you are already taking an opioid* medication. The combination may cause respiratory failure and death. ?Medication Agreement: You are responsible for carefully reading and following our Medication Agreement. This must be signed before receiving any prescriptions from our practice. Safely store a copy of your signed Agreement. Violations to the Agreement will result in no further prescriptions. (Additional copies of our  Medication Agreement are available upon request.) ?Laws, Rules, & Regulations: All patients are expected to follow all Federal and State Laws, Statutes, Rules, & Regulations. Ignorance of   the Laws does not constitute a valid excuse.  ?Illegal drugs and Controlled Substances: The use of illegal substances (including, but not limited to marijuana and its derivatives) and/or the illegal use of any controlled substances is strictly prohibited. Violation of this rule may result in the immediate and permanent discontinuation of any and all prescriptions being written by our practice. The use of any illegal substances is prohibited. ?Adopted CDC guidelines & recommendations: Target dosing levels will be at or below 60 MME/day. Use of benzodiazepines** is not recommended. ? ?Exceptions: There are only two exceptions to the rule of not receiving pain medications from other Healthcare Providers. ?Exception #1 (Emergencies): In the event of an emergency (i.e.: accident requiring emergency care), you are allowed to receive additional pain medication. However, you are responsible for: As soon as you are able, call our office [phone], at any time of the day or night, and leave a message stating your name, the date and nature of the emergency, and the name and dose of the medication prescribed. In the event that your call is answered by a member of our staff, make sure to document and save the date, time, and the name of the person that took your information.  ?Exception #2 (Planned Surgery): In the event that you are scheduled by another doctor or dentist to have any type of surgery or procedure, you are allowed (for a period no longer than 30 days), to receive additional pain medication, for the acute post-op pain. However, in this case, you are responsible for picking up a copy of our "Post-op Pain Management for Surgeons" handout, and giving it to your surgeon or dentist. This document is available at our office, and  does not require an appointment to obtain it. Simply go to our office during business hours (Monday-Thursday from 8:00 AM to 4:00 PM) (Friday 8:00 AM to 12:00 Noon) or if you have a scheduled appointment with us, prior to your surgery, and ask for it by name. In addition, you are responsible for: calling our office [phone], at any time of the day or night, and leaving a message stating your name, name of your surgeon, type of surgery, and date of procedure or surgery. Failure to comply with your responsibilities may result in termination of therapy involving the controlled substances. ?Medication Agreement Violation. Following the above rules, including your responsibilities will help you in avoiding a Medication Agreement Violation (?Breaking your Pain Medication Contract?). ? ?*Opioid medications include: morphine, codeine, oxycodone, oxymorphone, hydrocodone, hydromorphone, meperidine, tramadol, tapentadol, buprenorphine, fentanyl, methadone. ?**Benzodiazepine medications include: diazepam (Valium), alprazolam (Xanax), clonazepam (Klonopine), lorazepam (Ativan), clorazepate (Tranxene), chlordiazepoxide (Librium), estazolam (Prosom), oxazepam (Serax), temazepam (Restoril), triazolam (Halcion) ?(Last updated: 03/29/2021) ?____________________________________________________________________________________________ ? ____________________________________________________________________________________________ ? ?Medication Recommendations and Reminders ? ?Applies to: All patients receiving prescriptions (written and/or electronic). ? ?Medication Rules & Regulations: These rules and regulations exist for your safety and that of others. They are not flexible and neither are we. Dismissing or ignoring them will be considered "non-compliance" with medication therapy, resulting in complete and irreversible termination of such therapy. (See document titled "Medication Rules" for more details.) In all conscience,  because of safety reasons, we cannot continue providing a therapy where the patient does not follow instructions. ? ?Pharmacy of record:  ?Definition: This is the pharmacy where your electronic prescriptions w

## 2021-09-26 NOTE — Progress Notes (Signed)
PROVIDER NOTE: Interpretation of information contained herein should be left to medically-trained personnel. Specific patient instructions are provided elsewhere under "Patient Instructions" section of medical record. This document was created in part using STT-dictation technology, any transcriptional errors that may result from this process are unintentional.  ?Patient: Melinda [name] ?Type: Established ?DOB: 03/28/1956 ?MRN: [id number] ?PCP: Baity, Regina W, NP  Service: Procedure ?DOS: 09/26/2021 ?Setting: Ambulatory ?Location: Ambulatory outpatient facility ?Delivery: Face-to-face Provider: Francisco A Naveira, MD ?Specialty: Interventional Pain Management ?Specialty designation: 09 ?Location: Outpatient facility ?Ref. Prov.: Baity, Regina W, NP   ? ?Primary Reason for Visit: Interventional Pain Management Treatment. ?CC: Neck Pain ? ?Procedure:          ?Type: Management of Intrathecal Drug Delivery System (IDDS) - Reservoir Refill (95990). No rate change.  Drainage of 77 mL pump pocket seroma. ?Indications: ?1. Chronic pain syndrome   ?2. 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 Chronic sacroiliac joint pain (Bilateral)   ?4. DDD (degenerative disc disease), lumbosacral   ?5. Chronic hip pain (Bilateral)   ?6. Failed back surgical syndrome   ?7. Epidural fibrosis   ?8. Lumbar facet syndrome   ?9. Presence of intrathecal pump (Medtronic intrathecal programmable pump) (40 mL pump)   ?10. Encounter for interrogation of infusion pump   ?11. Encounter for adjustment or management of infusion pump   ?12. Long term current use of opiate analgesic   ?13. Pharmacologic therapy   ?14. Chronic use of opiate for therapeutic purpose   ?15. Encounter for medication management   ?16. Encounter for chronic pain management   ?17. Opiate use (22.5 MME/Day) (oral)   ?18. Cervicalgia   ? ?Pain Assessment: ?Self-Reported Pain Score: 5 /10             ?Reported level is compatible with observation.        ? ?Fluid has continued to accumulate in the pump pocket.  Today, under sterile conditions I have removed 77 mL of serous fluid.  No redness, increased temperature, or any other signs of infection.  The patient is pending to have surgery in 2 days to treat some hammertoes.  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  She reports today that she has began to again experience some low back pain.  The last time that we treated this was in 2020 with bilateral lumbar facet blocks.  She refers that her worst pain at this point it is the cervical spine.  Up to this point we have not treated that area and she did have some surgery.  She has indicated that she will probably call the surgeon for further evaluation.  If by any chance they tell her that there is nothing that they can do, we will again reevaluate for possible interventional therapy.  Before I could access her pump, I had to flip it.  We have attempted several times to anchor the pump, but the tissue surrounding it continues to be rather frail and does not take the anchors.  We have also attempted to go in and treat this recurrent seroma, but again these attempts have been unsuccessful. ? ?Today I had to assist the nursing staff in accessing the pump due to the fact that he had flipped. ?  ?Intrathecal Drug Delivery System (IDDS)  ?Pump Device:  ?Manufacturer: Medtronic ?Model: Synchromed II ?Model No.: 8637-40 ?Serial No.: [id number] ?Delivery Route: Intrathecal ?Type: Programmable  ?Volume (mL): 40 mL reservoir ?Priming Volume: n/a  ?  Calibration Constant: 113.0  ?MRI compatibility: Conditional  ? ?Implant Details:  ?Date: 03/11/2020  ?Implanter: Francisco Naveira, MD  ?Contact Information: [facility], [place], [place] ?Last Revision/Replacement: 03/11/2020 ?Estimated Replacement Date: Jul 2026  ?Implant Site: Abdominal ?Laterality: Right ? ?Catheter: ?Manufacturer: Medtronic ?Model: Ascenda ?Model No.: 8780  ?Serial No.: [id number]   ?Implanted Length (cm): 102.1  ?Catheter Volume (mL): 0.225  ?Tip Location (Level): T6-7 ?Canal Access Site: T12-L1 ? ?Drug content:  ?Primary Medication Class: Opioid  ?Medication: PF-Fentanyl  ?Concentration: 500 mcg/mL ? ?Secondary Medication Class: Local Anesthetic  ?Medication: PF-Bupivacaine  ?Concentration: 10 mg/mL ? ?Tertiary Medication Class: Antispasmodic  ?Medication: PF-Baclofen  ?Concentration: 120 mcg/mL ? ?Fourth Medication Class: none  ? ?PA parameters (PCA-mode):  ?Mode: Off (Inactive) ? ?Programming:  ?Type: Simple continuous.  ?Medication, Concentration, Infusion Program, & Delivery Rate: For up-to-date details please see most recent scanned programming printout. ? ? ?  ?Changes:  ?Medication Change: None at this point ?Rate Change: No change in rate ? ?Reported side-effects or adverse reactions: None reported ? ?Effectiveness: Described as relatively effective, allowing for increase in activities of daily living (ADL) ?Clinically meaningful improvement in function (CMIF): Sustained CMIF goals met ? ?Plan: Pump refill today ?  ?Pharmacotherapy Assessment  ? ?Opioid Analgesic: Oxycodone 5 mg, 1 tab PO q 8 hrs (15 mg/day of oxycodone) + Intrathecal PF-Fentanyl ?MME/day: 22.5 mg/day (oral).  ? ?Monitoring: ?[place] PMP: PDMP reviewed during this encounter.       ?Pharmacotherapy: No side-effects or adverse reactions reported. ?Compliance: No problems identified. ?Effectiveness: Clinically acceptable. ?Plan: Refer to "POC". UDS:  ?Summary  ?Date Value Ref Range Status  ?06/20/2021 Note  Final  ?  Comment:  ?  ==================================================================== ?[employer] Select 13 (MW) ?==================================================================== ?Test                             Result       Flag       Units ? ?Drug Present and Declared for Prescription Verification ?  Oxycodone                      659          EXPECTED   ng/mg creat ?  Oxymorphone                    161           EXPECTED   ng/mg creat ?  Noroxycodone                   3382         EXPECTED   ng/mg creat ?  Noroxymorphone                 53           EXPECTED   ng/mg creat ?   Sources of oxycodone are scheduled prescription medications. ?   Oxymorphone, noroxycodone, and noroxymorphone are expected ?   metabolites of oxycodone. Oxymorphone is also available as a ?   scheduled prescription medication. ? ?Drug Present not Declared for Prescription Verification ?  Carboxy-THC                    136          UNEXPECTED ng/mg creat ?   Carboxy-THC is a metabolite of tetrahydrocannabinol (THC). Source of ?   THC is most commonly herbal marijuana or marijuana-based products, ?     but THC is also present in a scheduled prescription medication. ?   Trace amounts of THC can be present in hemp and cannabidiol (CBD) ?   products. This test is not intended to distinguish between delta-9- ?   tetrahydrocannabinol, the predominant form of THC in most herbal or ?   marijuana-based products, and delta-8-tetrahydrocannabinol. ? ?  Fentanyl                       5            UNEXPECTED ng/mg creat ?  Norfentanyl                    108          UNEXPECTED ng/mg creat ?   Source of fentanyl is a scheduled prescription medication, including ?   IV, patch, and transmucosal formulations. Norfentanyl is an expected ?   metabolite of fentanyl. ? ?==================================================================== ?Test                      Result    Flag   Units      Ref Range ?  Creatinine              198              mg/dL      >=20 ?==================================================================== ?Declared Medications: ? The flagging and interpretation on this report are based on the ? following declared medications.  Unexpected results may arise from ? inaccuracies in the declared medications. ? ? **Note: The testing scope of this panel includes these medications: ? ? Oxycodone (Percocet) ? ? **Note: The testing scope of this panel does  not include the ? following reported medications: ? ? Acetaminophen (Percocet) ? Aspirin ? Biotin ? Bupropion (Wellbutrin) ? Calcium ? Chondroitin ? Ezetimibe (Zetia) ? Fish Oil ? Glucosamine ? Insulin ? Magnesium ? N

## 2021-09-26 NOTE — Progress Notes (Signed)
Nursing Pain Medication Assessment:  ?Safety precautions to be maintained throughout the outpatient stay will include: orient to surroundings, keep bed in low position, maintain call bell within reach at all times, provide assistance with transfer out of bed and ambulation.  ?Medication Inspection Compliance: Pill count conducted under aseptic conditions, in front of the patient. Neither the pills nor the bottle was removed from the patient's sight at any time. Once count was completed pills were immediately returned to the patient in their original bottle. ? ?Medication: See above ?Pill/Patch Count:  13 of 90 pills remain ?Pill/Patch Appearance: Markings consistent with prescribed medication ?Bottle Appearance: Standard pharmacy container. Clearly labeled. ?Filled Date: 3/ 2 / 2023 ?Last Medication intake:  TodaySafety precautions to be maintained throughout the outpatient stay will include: orient to surroundings, keep bed in low position, maintain call bell within reach at all times, provide assistance with transfer out of bed and ambulation.  ?

## 2021-09-27 ENCOUNTER — Telehealth: Payer: Self-pay | Admitting: *Deleted

## 2021-09-27 NOTE — Telephone Encounter (Signed)
No problems post IT pump refill. 

## 2021-09-28 ENCOUNTER — Other Ambulatory Visit: Payer: Self-pay

## 2021-09-28 ENCOUNTER — Encounter: Payer: Self-pay | Admitting: Podiatry

## 2021-09-28 ENCOUNTER — Encounter: Payer: Medicare Other | Admitting: Pain Medicine

## 2021-09-28 ENCOUNTER — Ambulatory Visit: Payer: Self-pay

## 2021-09-28 ENCOUNTER — Ambulatory Visit
Admission: RE | Admit: 2021-09-28 | Discharge: 2021-09-28 | Disposition: A | Discharge status: Home / Self Care | Payer: Medicare Other | Attending: Podiatry | Admitting: Podiatry

## 2021-09-28 ENCOUNTER — Encounter
Admission: RE | Disposition: A | Discharge status: Home / Self Care | Payer: Self-pay | Source: Home / Self Care | Attending: Podiatry

## 2021-09-28 ENCOUNTER — Ambulatory Visit: Payer: Medicare Other | Admitting: Anesthesiology

## 2021-09-28 DIAGNOSIS — N189 Chronic kidney disease, unspecified: Secondary | ICD-10-CM | POA: Diagnosis not present

## 2021-09-28 DIAGNOSIS — Z87891 Personal history of nicotine dependence: Secondary | ICD-10-CM | POA: Insufficient documentation

## 2021-09-28 DIAGNOSIS — L97529 Non-pressure chronic ulcer of other part of left foot with unspecified severity: Secondary | ICD-10-CM | POA: Diagnosis not present

## 2021-09-28 DIAGNOSIS — M2042 Other hammer toe(s) (acquired), left foot: Secondary | ICD-10-CM | POA: Insufficient documentation

## 2021-09-28 DIAGNOSIS — E1122 Type 2 diabetes mellitus with diabetic chronic kidney disease: Secondary | ICD-10-CM | POA: Diagnosis not present

## 2021-09-28 DIAGNOSIS — I2510 Atherosclerotic heart disease of native coronary artery without angina pectoris: Secondary | ICD-10-CM | POA: Diagnosis not present

## 2021-09-28 DIAGNOSIS — E1142 Type 2 diabetes mellitus with diabetic polyneuropathy: Secondary | ICD-10-CM | POA: Diagnosis not present

## 2021-09-28 DIAGNOSIS — Z955 Presence of coronary angioplasty implant and graft: Secondary | ICD-10-CM | POA: Insufficient documentation

## 2021-09-28 DIAGNOSIS — L97522 Non-pressure chronic ulcer of other part of left foot with fat layer exposed: Secondary | ICD-10-CM

## 2021-09-28 DIAGNOSIS — I456 Pre-excitation syndrome: Secondary | ICD-10-CM | POA: Diagnosis not present

## 2021-09-28 DIAGNOSIS — M797 Fibromyalgia: Secondary | ICD-10-CM | POA: Insufficient documentation

## 2021-09-28 DIAGNOSIS — M2032 Hallux varus (acquired), left foot: Secondary | ICD-10-CM | POA: Insufficient documentation

## 2021-09-28 DIAGNOSIS — G4730 Sleep apnea, unspecified: Secondary | ICD-10-CM | POA: Insufficient documentation

## 2021-09-28 DIAGNOSIS — L97422 Non-pressure chronic ulcer of left heel and midfoot with fat layer exposed: Secondary | ICD-10-CM | POA: Diagnosis not present

## 2021-09-28 DIAGNOSIS — E11621 Type 2 diabetes mellitus with foot ulcer: Secondary | ICD-10-CM | POA: Diagnosis not present

## 2021-09-28 HISTORY — PX: WOUND DEBRIDEMENT: SHX247

## 2021-09-28 HISTORY — PX: EXCISION PARTIAL PHALANX: SHX6617

## 2021-09-28 LAB — GLUCOSE, CAPILLARY
Glucose-Capillary: 105 mg/dL — ABNORMAL HIGH (ref 70–99)
Glucose-Capillary: 103 mg/dL — ABNORMAL HIGH (ref 70–99)

## 2021-09-28 SURGERY — EXCISION, PHALANX, PARTIAL
Anesthesia: General | Laterality: Left

## 2021-09-28 MED ORDER — ONDANSETRON HCL 4 MG/2ML IJ SOLN
INTRAMUSCULAR | Status: DC | PRN
  Administered 2021-09-28: 4 mg via INTRAVENOUS

## 2021-09-28 MED ORDER — OXYCODONE HCL 5 MG/5ML PO SOLN: 5.0000 mg | Freq: Once | ORAL | Status: DC | PRN

## 2021-09-28 MED ORDER — HYDROCODONE-ACETAMINOPHEN 5-325 MG PO TABS: 1.0000 | ORAL_TABLET | ORAL | 0 refills | Status: DC | PRN

## 2021-09-28 MED ORDER — LACTATED RINGERS IV SOLN: INTRAVENOUS | Status: DC

## 2021-09-28 MED ORDER — HYDROCODONE-ACETAMINOPHEN 5-325 MG PO TABS: 1.0000 | ORAL_TABLET | Freq: Four times a day (QID) | ORAL | 0 refills | Status: AC | PRN

## 2021-09-28 MED ORDER — OXYCODONE HCL 5 MG PO TABS: 5.0000 mg | ORAL_TABLET | Freq: Once | ORAL | Status: DC | PRN

## 2021-09-28 MED ORDER — CEFAZOLIN SODIUM-DEXTROSE 2-4 GM/100ML-% IV SOLN
2.0000 g | INTRAVENOUS | Status: AC
  Administered 2021-09-28: 2 g via INTRAVENOUS

## 2021-09-28 MED ORDER — FENTANYL CITRATE PF 50 MCG/ML IJ SOSY: 25.0000 ug | PREFILLED_SYRINGE | INTRAMUSCULAR | Status: DC | PRN

## 2021-09-28 MED ORDER — 0.9 % SODIUM CHLORIDE (POUR BTL) OPTIME
TOPICAL | Status: DC | PRN
  Administered 2021-09-28: 1000 mL

## 2021-09-28 MED ORDER — MIDAZOLAM HCL 2 MG/2ML IJ SOLN
INTRAMUSCULAR | Status: DC | PRN
  Administered 2021-09-28: 2 mg via INTRAVENOUS

## 2021-09-28 MED ORDER — PROPOFOL 500 MG/50ML IV EMUL
INTRAVENOUS | Status: DC | PRN
Start: 2021-09-28 — End: 2021-09-28
  Administered 2021-09-28: 150 mg via INTRAVENOUS

## 2021-09-28 MED ORDER — LIDOCAINE HCL 1 % IJ SOLN
INTRAMUSCULAR | Status: DC | PRN
  Administered 2021-09-28 (×2): 10 mL

## 2021-09-28 MED ORDER — AMOXICILLIN-POT CLAVULANATE 875-125 MG PO TABS: 1.0000 | ORAL_TABLET | Freq: Two times a day (BID) | ORAL | 0 refills | Status: AC

## 2021-09-28 MED ORDER — LIDOCAINE HCL (CARDIAC) PF 100 MG/5ML IV SOSY
PREFILLED_SYRINGE | INTRAVENOUS | Status: DC | PRN
  Administered 2021-09-28: 50 mg via INTRAVENOUS

## 2021-09-28 MED ORDER — FENTANYL CITRATE (PF) 100 MCG/2ML IJ SOLN
INTRAMUSCULAR | Status: DC | PRN
Start: 2021-09-28 — End: 2021-09-28
  Administered 2021-09-28 (×2): 50 ug via INTRAVENOUS

## 2021-09-28 MED FILL — Medication: INTRATHECAL | Qty: 1 | Status: AC

## 2021-09-28 SURGICAL SUPPLY — 40 items
BNDG CMPR STD VLCR NS LF 5.8X4 (GAUZE/BANDAGES/DRESSINGS) ×1
BNDG CMPR STD VLCR NS LF 5.8X6 (GAUZE/BANDAGES/DRESSINGS) ×1
BNDG COHESIVE 4X5 TAN ST LF (GAUZE/BANDAGES/DRESSINGS) ×3 IMPLANT
BNDG ELASTIC 4X5.8 VLCR NS LF (GAUZE/BANDAGES/DRESSINGS) ×3 IMPLANT
BNDG ELASTIC 6X5.8 VLCR NS LF (GAUZE/BANDAGES/DRESSINGS) ×3 IMPLANT
BNDG ESMARK 4X12 TAN STRL LF (GAUZE/BANDAGES/DRESSINGS) ×3 IMPLANT
BNDG GAUZE ELAST 4 BULKY (GAUZE/BANDAGES/DRESSINGS) ×3 IMPLANT
CANISTER SUCT 1200ML W/VALVE (MISCELLANEOUS) ×3 IMPLANT
COVER LIGHT HANDLE UNIVERSAL (MISCELLANEOUS) ×6 IMPLANT
CUFF TOURN SGL QUICK 18X4 (TOURNIQUET CUFF) IMPLANT
DRAPE FLUOR MINI C-ARM 54X84 (DRAPES) ×3 IMPLANT
DURAPREP 26ML APPLICATOR (WOUND CARE) ×3 IMPLANT
ELECT REM PT RETURN 9FT ADLT (ELECTROSURGICAL) ×2
ELECTRODE REM PT RTRN 9FT ADLT (ELECTROSURGICAL) ×2 IMPLANT
GAUZE SPONGE 4X4 12PLY STRL (GAUZE/BANDAGES/DRESSINGS) ×3 IMPLANT
GAUZE XEROFORM 1X8 LF (GAUZE/BANDAGES/DRESSINGS) ×3 IMPLANT
GLOVE SURG POLYISO LF SZ7 (GLOVE) ×6 IMPLANT
GLOVE SURG UNDER POLY LF SZ7 (GLOVE) ×3 IMPLANT
GOWN STRL REUS W/ TWL LRG LVL3 (GOWN DISPOSABLE) ×4 IMPLANT
GOWN STRL REUS W/TWL LRG LVL3 (GOWN DISPOSABLE) ×4
KIT TURNOVER KIT A (KITS) ×3 IMPLANT
NS IRRIG 500ML POUR BTL (IV SOLUTION) ×3 IMPLANT
PACK EXTREMITY ARMC (MISCELLANEOUS) ×3 IMPLANT
PADDING CAST BLEND 4X4 NS (MISCELLANEOUS) ×9 IMPLANT
SPLINT CAST 1 STEP 4X30 (MISCELLANEOUS) ×3 IMPLANT
STOCKINETTE IMPERVIOUS LG (DRAPES) ×3 IMPLANT
SUT ETHILON 3-0 FS-10 30 BLK (SUTURE) ×2
SUT ETHILON 4-0 (SUTURE)
SUT ETHILON 4-0 FS2 18XMFL BLK (SUTURE)
SUT MNCRL+ 5-0 UNDYED PC-3 (SUTURE) IMPLANT
SUT MONOCRYL 5-0 (SUTURE)
SUT VIC AB 0 CT1 27 (SUTURE)
SUT VIC AB 0 CT1 27XCR 8 STRN (SUTURE) IMPLANT
SUT VIC AB 2-0 SH 27 (SUTURE)
SUT VIC AB 2-0 SH 27XBRD (SUTURE) IMPLANT
SUT VIC AB 3-0 SH 27 (SUTURE) ×2
SUT VIC AB 3-0 SH 27X BRD (SUTURE) IMPLANT
SUT VIC AB 4-0 FS2 27 (SUTURE) IMPLANT
SUTURE EHLN 3-0 FS-10 30 BLK (SUTURE) IMPLANT
SUTURE ETHLN 4-0 FS2 18XMF BLK (SUTURE) IMPLANT

## 2021-09-28 NOTE — Op Note (Signed)
PODIATRY / FOOT AND ANKLE SURGERY OPERATIVE REPORT ? ? ? ?SURGEON: Caroline More, DPM ? ?PRE-OPERATIVE DIAGNOSIS:  ?1.  Left hallux malleus ?2.  Left hallux distal tip subcutaneous ulceration ?3.  Diabetes type 2 polyneuropathy ? ?POST-OPERATIVE DIAGNOSIS: Same ? ? ?PROCEDURE(S): ?Left hallux IPJ arthroplasty ? ?HEMOSTASIS: Left ankle tourniquet ? ?ANESTHESIA: MAC ? ?ESTIMATED BLOOD LOSS: 5 cc ? ?FINDING(S): ?1.  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  Ulceration to the distal tip of the left hallux measures 0.5 x 0.3 x 0.1 cm to subcutaneous tissue ? ?PATHOLOGY/SPECIMEN(S): None ? ?INDICATIONS:   ?Melinda [name] is a 66 y.o. female who presents with a chronic ulceration to the distal tip of the left hallux.  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  Patient does have a history of diabetes with polyneuropathy as well as chronic pain.  Patient has been treated conservatively with local wound care and offloading dressings and offloading padding/shoes but still continues to ulcerate to the distal tip of the toe.  All treatment options were discussed with the patient both conservative and surgical attempts at correction clean potential risks and complications at this time patient is elected for surgical procedure consisting of left hallux IPJ arthroplasty with possible capsular tendon balancing to the first metatarsal phalangeal joint. ? ?DESCRIPTION: ?After obtaining full informed written consent, the patient was brought back to the operating room and placed supine upon the operating table.  The patient received IV antibiotics prior to induction.  After obtaining adequate anesthesia, a Mayo block was performed with 10 cc of 1% lidocaine plain.  The patient was prepped and draped in the standard fashion.  An Esmarch bandage was used to exsanguinate the left lower extremity and pneumatic ankle tourniquet was inflated. ? ?Attention was then directed to the  left hallux IPJ where a semielliptical type of incision was made over the joint resecting a piece of skin and subcutaneous tissue over the joint level.  Once this was ellipsed and passed off the operative site a extensor tenotomy was performed and capsulotomy followed by release the collateral ligaments.  The extensor tendon was reflected proximally off the head of the proximal phalanx of the hallux.  At this time a sagittal bone saw was used to resect the head of the proximal phalanx at the operative site and this was removed and passed off the operative site.  There appeared to be some redundant extensor tendon and this was also resected and passed off in the operative site.  The surgical site was flushed with copious amounts normal sterile saline.  The capsular and periosteal tissue was then reapproximated well coapted with 3-0 Vicryl as well as the subcutaneous tissue.  The skin was then reapproximated well coapted with 3-0 nylon in a combination of simple and horizontal mattress type stitching.  An additional 10 cc of 1% lidocaine plain was injected about the operative site.  A postoperative dressing was then applied consisting of Xeroform followed by 4 x 4 gauze, Kerlix, Ace wrap.  The pneumatic ankle tourniquet was deflated prior to placing the dressing and a prompt hyperemic response was noted to the left foot.  The patient tolerated the procedure and anesthesia well was transferred to recovery room vital signs stable vascular status intact all toes left foot.  Following a period of postoperative monitoring patient be discharged home with the appropriate orders and follow-up instructions.  Patient is to remain partial weightbearing with heel contact  in a surgical shoe and should only be walking for short distances for the first 2 weeks. ? ?COMPLICATIONS: none  ? ?CONDITION: good, stable ? ?Caroline More, DPM ? ?

## 2021-09-28 NOTE — H&P (Signed)
HISTORY AND PHYSICAL INTERVAL NOTE: ? ?09/28/2021 ? ?7:23 AM ? 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  The various methods of treatment have been discussed with the patient.  No guarantees were given.  After consideration of risks, benefits and other options for treatment, the patient has consented to surgery.  I have reviewed the patients? chart and labs.   ? ?PROCEDURE: ?LEFT HALLUX IPJ ARTHROPLASTY ?LEFT EXTENSOR TENDON LENGTHENING/CTB 1ST MTPJ ? ?A history and physical examination was performed in my office.  The patient was reexamined.  There have been no changes to this history and physical examination. ? ?Caroline More, DPM ? ?

## 2021-09-28 NOTE — Anesthesia Postprocedure Evaluation (Signed)
Anesthesia Post Note ? ?Patient: Melinda [name] ? ?Procedure(s) Performed: EXCISION PARTIAL PHALANX - FIRST (Left) ?DEBRIDEMENT SKIN, SUBCUTANEOUS TISSUE - 11042 (Left) ? ? ?  ?Patient location during evaluation: PACU ?Anesthesia Type: General ?Level of consciousness: awake and alert ?Pain management: pain level controlled ?Vital Signs Assessment: post-procedure vital signs reviewed and stable ?Respiratory status: spontaneous breathing, nonlabored ventilation, respiratory function stable and patient connected to nasal cannula oxygen ?Cardiovascular status: blood pressure returned to baseline and stable ?Postop Assessment: no apparent nausea or vomiting ?Anesthetic complications: no ? ? ?No notable events documented. ? ?Melinda [name] ? ? ? ? ? ?

## 2021-09-28 NOTE — Anesthesia Procedure Notes (Signed)
Procedure Name: LMA Insertion ?Date/Time: 09/28/2021 7:43 AM ?Performed by: Jeannene Patella, CRNA ?Pre-anesthesia Checklist: Patient identified, Emergency Drugs available, Suction available, Timeout performed and Patient being monitored ?Patient Re-evaluated:Patient Re-evaluated prior to induction ?Oxygen Delivery Method: Circle system utilized ?Preoxygenation: Pre-oxygenation with 100% oxygen ?Induction Type: IV induction ?LMA: LMA inserted ?LMA Size: 4.0 ?Number of attempts: 1 ?Placement Confirmation: positive ETCO2 and breath sounds checked- equal and bilateral ?Tube secured with: Tape ?Dental Injury: Teeth and Oropharynx as per pre-operative assessment  ? ? ? ? ?

## 2021-09-28 NOTE — Transfer of Care (Signed)
Immediate Anesthesia Transfer of Care Note ? ?Patient: Melinda [name] ? ?Procedure(s) Performed: EXCISION PARTIAL PHALANX - FIRST (Left) ?DEBRIDEMENT SKIN, SUBCUTANEOUS TISSUE - 11042 (Left) ? ?Patient Location: PACU ? ?Anesthesia Type: General ? ?Level of Consciousness: awake, alert  and patient cooperative ? ?Airway and Oxygen Therapy: Patient Spontanous Breathing and Patient connected to supplemental oxygen ? ?Post-op Assessment: Post-op Vital signs reviewed, Patient's Cardiovascular Status Stable, Respiratory Function Stable, Patent Airway and No signs of Nausea or vomiting ? ?Post-op Vital Signs: Reviewed and stable ? ?Complications: No notable events documented. ? ?

## 2021-09-28 NOTE — Anesthesia Preprocedure Evaluation (Signed)
Anesthesia Evaluation  ?Patient identified by MRN, date of birth, ID band ?Patient awake ? ? ? ?History of Anesthesia Complications ?Negative for: history of anesthetic complications ? ?Airway ?Mallampati: III ? ?TM Distance: >3 FB ?Neck ROM: Limited ? ? ?Comment: Limited neck lateral rotation but good extension  Dental ?no notable dental hx. ? ?  ?Pulmonary ?sleep apnea and Continuous Positive Airway Pressure Ventilation , former smoker,  ?  ?Pulmonary exam normal ? ? ? ? ? ? ? Cardiovascular ?+ CAD and + Cardiac Stents (DES to LAD 2009 2012)  ?Normal cardiovascular exam+ dysrhythmias (WPW s/p ablation)  ? ?Orthostatic hypotension, on midodrine ?  ?Neuro/Psych ?negative neurological ROS ?   ? GI/Hepatic ?negative GI ROS, Neg liver ROS,   ?Endo/Other  ?diabetes, Type 2 ? Renal/GU ?Renal disease (mild CKD)  ? ?  ?Musculoskeletal ? ?(+) Fibromyalgia -Chronic pain, IT fentanyl/baclofen/bupivicaine pump  ? Abdominal ?  ?Peds ? Hematology ?negative hematology ROS ?(+)   ?Anesthesia Other Findings ? ? Reproductive/Obstetrics ? ?  ? ? ? ? ? ? ? ? ? ? ? ? ? ?  ?  ? ? ? ? ? ? ? ? ?Anesthesia Physical ?Anesthesia Plan ? ?ASA: 3 ? ?Anesthesia Plan: General  ? ?Post-op Pain Management: Ofirmev IV (intra-op) and Toradol IV (intra-op)  ? ?Induction: Intravenous ? ?PONV Risk Score and Plan: 3 and Midazolam, Treatment may vary due to age or medical condition, Ondansetron and Dexamethasone ? ?Airway Management Planned: LMA ? ?Additional Equipment: None ? ?Intra-op Plan:  ? ?Post-operative Plan: Extubation in OR ? ?Informed Consent: I have reviewed the patients History and Physical, chart, labs and discussed the procedure including the risks, benefits and alternatives for the proposed anesthesia with the patient or authorized representative who has indicated his/her understanding and acceptance.  ? ? ? ? ? ?Plan Discussed with: CRNA ? ?Anesthesia Plan Comments:   ? ? ? ? ? ? ?Anesthesia Quick  Evaluation ? ?

## 2021-09-29 ENCOUNTER — Encounter: Payer: Self-pay | Admitting: Pain Medicine

## 2021-09-29 ENCOUNTER — Encounter: Payer: Self-pay | Admitting: Podiatry

## 2021-10-03 NOTE — Progress Notes (Signed)
?Cardiology Office Note:   ? ?Date:  10/04/2021  ? ?ID:  Melinda [name], DOB 12-Jan-1956, MRN [id number] ? ?PCP:  Jearld Fenton, NP  ?[facility] Providers ?Cardiologist:  Candee Furbish, MD    ?Referring MD: Jearld Fenton, NP  ? ?Chief Complaint:  Follow-up for CAD, blood pressure ?  ? 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 ?Prior CV Studies: ?Carotid US 04/22/20 ?R 40-59; L 1-39 ? ?Myoview 06/04/2019 ?EF 62, no ischemia or infarction; low risk ? ?Echocardiogram 04/22/2019 ?EF 60-65, no LVH, no RWMA, normal RVSF, trivial TR, RVSP 34.8 ? ?Cardiac catheterization 02/27/11 ?LAD stent patent  ? ?Cardiac catheterization 02/20/2008 ?EF 70 ?LM normal ?LAD 80 ?LCx normal  ?RCA normal  ?PCI: 15 x 30 mm Promus DES to LAD  ? ? ?History of Present Illness:   ?Melinda [name] is a 66 y.o. female with the above problem list.  She was last seen by Dr. Marlou Porch in Jan 2023.  She was experiencing symptomatic hypotension at that time. She then had a preop phone visit with Coletta Memos, NP September 21, 2021 prior to L foot surgery on March 30.   ? ?She returns for f/u.  She is here alone.  Since last seen, her Midodrine dose has been adjusted and she has been placed on fludrocortisone.  She feels like her blood pressure has stabilized.  She started having issues with her blood pressure again a few months ago.  Since that time, she has noted exertional chest discomfort.  She is able to exert herself at other times without chest pain.  She seems to have associated low blood pressure with these episodes.  She has not had associated shortness of breath, arm or jaw pain, nausea or diaphoresis.  She has not had syncope.  She has not had orthopnea or leg edema. ?    ?Past Medical History:  ?Diagnosis Date  ? Amputation of great toe, right, traumatic ([place]) 05/30/2010  ? Amputation of second toe, right, traumatic ([place]) 09/2017  ? Anginal pain ([place])   ? Anxiety   ? Blue toes   ? 2nd toe on right foot, will get appt.  ? Bulging disc   ? CAD (coronary artery disease) 2009  ? s/p stent to LAD  ? 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 Chicken pox   ? Chronic fatigue   ? Chronic kidney disease   ? "problem with kidney filtration"  ? Coronary arteritis   ? Degenerative disc disease   ? Back, neck, hands, knees  ? Depression   ? Diabetes mellitus without complication ([place])   ? Facet joint disease   ? Fibromyalgia   ? Heart disease   ? Hyperlipidemia   ? IBS (irritable bowel syndrome)   ? MCL deficiency, knee   ? MRSA (methicillin resistant staph aureus) culture positive 2011  ? GREAT TOE RIGHT FOOT  ? Neuropathy 04/18/2010  ? Orthostatic hypotension 01/2020  ? Peripheral neuropathy   ? Renal insufficiency   ? Restless leg syndrome   ? Sepsis ([place]) 09/2013  ? Sleep apnea 08/17/2003  ? uses CPAP, sleep study at [facility] (mild to moderate)  ?  Spinal stenosis   ? ?Current Medications: ?Current Meds  ?Medication Sig  ? ACCU-CHEK GUIDE test strip USE 1 EACH BY OTHER ROUTE 3 (THREE) TIMES DAILY. USE AS INSTRUCTE  ? Accu-Chek Softclix Lancets lancets USE TO CHECK BLOOD SUGAR 1 TIME DAILY  ? AMBULATORY NON FORMULARY MEDICATION Medication Name: CPAP MASK OF CHOICE FOR HOME DEVICE  ? amoxicillin-clavulanate (AUGMENTIN) 875-125 MG tablet Take 1 tablet by mouth 2 (two) times daily for 7 days.  ? Ascorbic Acid (VITAMIN C) 1000 MG tablet Take 1,000 mg by mouth daily.  ? ASPIRIN 81 PO Take 81 mg by mouth daily.  ? b complex vitamins tablet Take 1 tablet by mouth in the morning.  ? Biotin 10000 MCG TABS Take 10,000 mcg by mouth daily.  ? Blood Glucose Calibration (ACCU-CHEK GUIDE CONTROL) LIQD 1 each by In Vitro route as  needed.  ? buPROPion (WELLBUTRIN XL) 150 MG 24 hr tablet Take 1 tablet (150 mg total) by mouth daily.  ? CALCIUM-MAGNESIUM-ZINC PO Take 3 capsules by mouth daily.  ? Cholecalciferol (VITAMIN D) 50 MCG (2000 UT) tablet Take 2,000 Units by mouth daily.  ? CINNAMON PO Take 1,000 mg by mouth daily.  ? ezetimibe (ZETIA) 10 MG tablet TAKE 1 TABLET BY MOUTH EVERY DAY  ? fludrocortisone (FLORINEF) 0.1 MG tablet Take 1 tablet (0.1 mg total) by mouth daily.  ? glucosamine-chondroitin 500-400 MG tablet Take 2 tablets by mouth daily.   ? HYDROcodone-acetaminophen (NORCO/VICODIN) 5-325 MG tablet Take 1 tablet by mouth every 6 (six) hours as needed for up to 7 days for moderate pain.  ? Insulin Pen Needle 31G X 5 MM MISC BD Pen Needles- brand specific Inject insulin via insulin pen 6 x daily  ? Magnesium 400 MG CAPS Take 400 mg by mouth at bedtime.  ? midodrine (PROAMATINE) 5 MG tablet Take 1 tablet (5 mg total) by mouth 2 (two) times daily with a meal.  ? nitroGLYCERIN (NITROSTAT) 0.4 MG SL tablet PLACE 1 TABLET UNDER THE TONGUE EVERY 5 (FIVE) MINUTES AS NEEDED FOR CHEST PAIN.  ? NONFORMULARY OR COMPOUNDED ITEM 212.8 mcg by Epidural Infusion route. Medtronic Neuromodulation pump  ?Fentanyl 1,000.0 mcg/ml ?Baclofen 240.0 mcg/ml ?Bupivacaine 20.0 mg/ml ?Total daily dose 269.6 mcg/day  ? Omega-3 Fatty Acids (FISH OIL) 1200 MG CAPS Take 1,200 mg by mouth in the morning.   ? [START ON 10/26/2021] oxyCODONE-acetaminophen (PERCOCET) 5-325 MG tablet Take 1 tablet by mouth every 8 (eight) hours as needed for severe pain. Each refill must last 30 days.  ? oxyCODONE-acetaminophen (PERCOCET) 5-325 MG tablet Take 1 tablet by mouth every 8 (eight) hours as needed for severe pain. Each refill must last 30 days.  ? [START ON 11/25/2021] oxyCODONE-acetaminophen (PERCOCET) 5-325 MG tablet Take 1 tablet by mouth every 8 (eight) hours as needed for severe pain. Each refill must last 30 days.  ? OZEMPIC, 0.25 OR 0.5 MG/DOSE, 2 MG/1.5ML SOPN INJECT 0.5  MG INTO THE SKIN ONCE A WEEK. FOR FIRST 4 WEEKS. THEN INCREASE DOSE TO 0.'5MG'$  WEEKLY  ? PARoxetine (PAXIL) 20 MG tablet TAKE 1/2 TABLET BY MOUTH DAILY IN THE MORNING  ? polyethylene glycol (MIRALAX / GLYCOLAX) packet Take 17 g by mouth daily.  ? pregabalin (LYRICA) 150 MG capsule Take 1 capsule (150 mg total) by mouth 3 (three) times daily.  ? rosuvastatin (CRESTOR) 20 MG tablet TAKE 1 TABLET BY MOUTH EVERY DAY  ? tiZANidine (ZANAFLEX) 4 MG tablet TAKE 1 TABLET (4 MG TOTAL) BY MOUTH EVERY 8 (  EIGHT) HOURS AS NEEDED FOR MUSCLE SPASMS  ? vitamin E 180 MG (400 UNITS) capsule Take 400 Units by mouth daily.  ?  ?Allergies:   Patient has no known allergies.  ? ?Social History  ? ?Tobacco Use  ? Smoking status: Former  ?  Packs/day: 1.50  ?  Years: 32.00  ?  Pack years: 48.00  ?  Types: Cigarettes  ?  Quit date: 07/22/2007  ?  Years since quitting: 14.2  ? Smokeless tobacco: Never  ?Vaping Use  ? Vaping Use: Never used  ?Substance Use Topics  ? Alcohol use: Yes  ?  Comment: occ - Holidays  ? Drug use: Never  ?  Comment: prescribed pain pump and oxy  ?  ?Family Hx: ?The patient's family history includes Alcohol abuse in her paternal grandfather; Arthritis in her father; Cancer in her mother; Dementia in her maternal grandmother; Diabetes in her father; Heart disease in her father and paternal grandfather; Lung cancer in her mother; Stroke in her paternal grandfather. ? ?Review of Systems  ?Gastrointestinal:  Positive for constipation. Negative for hematochezia.  ?Genitourinary:  Negative for hematuria.   ? ?EKGs/Labs/Other Test Reviewed:   ? ?EKG:  EKG is not ordered today.  The ekg ordered today demonstrates n/a ? ?Recent Labs: ?06/22/2021: ALT 25; BUN 24; Creat 1.04; Hemoglobin 13.1; Platelets 216; Potassium 4.3; Sodium 142  ? ?Recent Lipid Panel ?Recent Labs  ?  06/22/21 ?1450  ?CHOL 98  ?TRIG 114  ?HDL 39*  ?[place] 39  ?  ? ?Risk Assessment/Calculations:   ?  ?    ?Physical Exam:   ? ?VS:  BP (!) 120/48   Pulse 77   Ht  '5\' 9"'$  (1.753 m)   Wt 211 lb 9.6 oz (96 kg)   LMP  (LMP Unknown)   SpO2 93%   BMI 31.25 kg/m?    ? ?Wt Readings from Last 3 Encounters:  ?10/04/21 211 lb 9.6 oz (96 kg)  ?09/28/21 210 lb (95.3 kg)  ?09/26/21

## 2021-10-04 ENCOUNTER — Encounter: Payer: Self-pay | Admitting: Physician Assistant

## 2021-10-04 ENCOUNTER — Ambulatory Visit (INDEPENDENT_AMBULATORY_CARE_PROVIDER_SITE_OTHER): Payer: Medicare Other | Admitting: Physician Assistant

## 2021-10-04 VITALS — BP 120/48 | HR 77 | Ht 69.0 in | Wt 211.6 lb

## 2021-10-04 DIAGNOSIS — I2510 Atherosclerotic heart disease of native coronary artery without angina pectoris: Secondary | ICD-10-CM | POA: Diagnosis not present

## 2021-10-04 DIAGNOSIS — I6523 Occlusion and stenosis of bilateral carotid arteries: Secondary | ICD-10-CM | POA: Insufficient documentation

## 2021-10-04 DIAGNOSIS — E1142 Type 2 diabetes mellitus with diabetic polyneuropathy: Secondary | ICD-10-CM | POA: Diagnosis not present

## 2021-10-04 DIAGNOSIS — R072 Precordial pain: Secondary | ICD-10-CM | POA: Diagnosis not present

## 2021-10-04 DIAGNOSIS — I951 Orthostatic hypotension: Secondary | ICD-10-CM | POA: Diagnosis not present

## 2021-10-04 DIAGNOSIS — E7800 Pure hypercholesterolemia, unspecified: Secondary | ICD-10-CM

## 2021-10-04 DIAGNOSIS — L97522 Non-pressure chronic ulcer of other part of left foot with fat layer exposed: Secondary | ICD-10-CM | POA: Diagnosis not present

## 2021-10-04 DIAGNOSIS — E1141 Type 2 diabetes mellitus with diabetic mononeuropathy: Secondary | ICD-10-CM

## 2021-10-04 HISTORY — DX: Occlusion and stenosis of bilateral carotid arteries: I65.23

## 2021-10-04 NOTE — Assessment & Plan Note (Signed)
LDL was optimal in December 2022.  Continue Crestor 20 mg daily, Zetia 10 mg daily. ?

## 2021-10-04 NOTE — Assessment & Plan Note (Signed)
History of DES to the LAD in 2009.  Cardiac catheterization in 2012 with patent stent.  Last stress test in 2020 was low risk.  She does note exertional chest discomfort since her hypotensive episodes resumed several months ago.  It appears that she had a recent EKG without acute changes at primary care.  It is also reassuring that she recently underwent general anesthesia to have foot surgery.  In any event, I have recommended proceeding with stress testing to rule out ischemia as a cause for her chest pain.  Continue Zetia 10 mg daily, Crestor 20 mg daily, aspirin 81 mg daily.  Follow-up with Dr. Marlou Porch in 3 months. ?

## 2021-10-04 NOTE — Patient Instructions (Addendum)
Medication Instructions:  ?Your physician recommends that you continue on your current medications as directed. Please refer to the Current Medication list given to you today. ? ?*If you need a refill on your cardiac medications before your next appointment, please call your pharmacy* ? ? ?Lab Work: ?None ordered ? ?If you have labs (blood work) drawn today and your tests are completely normal, you will receive your results only by: ?MyChart Message (if you have MyChart) OR ?A paper copy in the mail ?If you have any lab test that is abnormal or we need to change your treatment, we will call you to review the results. ? ? ?Testing/Procedures: ?Your physician has requested that you have a lexiscan myoview. For further information please visit [email or web address]. Please follow instruction sheet, BELOW: ? ? ? ?You are scheduled for a Myocardial Perfusion Imaging Study  ?Please arrive 15 minutes prior to your appointment time for registration and insurance purposes. ? ?The test will take approximately 3 to 4 hours to complete; you may bring reading material.  If someone comes with you to your appointment, they will need to remain in the main lobby due to limited space in the testing area. **If you are pregnant or breastfeeding, please notify the nuclear lab prior to your appointment** ? ?How to prepare for your Myocardial Perfusion Test: ?Do not eat or drink 3 hours prior to your test, except you may have water. ?Do not consume products containing caffeine (regular or decaffeinated) 12 hours prior to your test. (ex: coffee, chocolate, sodas, tea). ?Do bring a list of your current medications with you.  If not listed below, you may take your medications as normal. ?Do wear comfortable clothes (no dresses or overalls) and walking shoes, tennis shoes preferred (No heels or open toe shoes are allowed). ?Do NOT wear cologne, perfume, aftershave, or lotions (deodorant is allowed). ?If these instructions are not followed, your  test will have to be rescheduled. ? ? ? ?Your physician has requested that you have a carotid duplex. This test is an ultrasound of the carotid arteries in your neck. It looks at blood flow through these arteries that supply the brain with blood. Allow one hour for this exam. There are no restrictions or special instructions. ? ? ?Follow-Up: ?At [facility], you and your health needs are our priority.  As part of our continuing mission to provide you with exceptional heart care, we have created designated Provider Care Teams.  These Care Teams include your primary Cardiologist (physician) and Advanced Practice Providers (APPs -  Physician Assistants and Nurse Practitioners) who all work together to provide you with the care you need, when you need it. ? ?We recommend signing up for the patient portal called "MyChart".  Sign up information is provided on this After Visit Summary.  MyChart is used to connect with patients for Virtual Visits (Telemedicine).  Patients are able to view lab/test results, encounter notes, upcoming appointments, etc.  Non-urgent messages can be sent to your provider as well.   ?To learn more about what you can do with MyChart, go to [email or web address].   ? ?Your next appointment:   ?3 month(s) ? ?The format for your next appointment:   ?In Person ? ?Provider:   ?Candee Furbish, MD   ? ? ?Other Instructions ? ?

## 2021-10-04 NOTE — Assessment & Plan Note (Signed)
Carotid US in 2021 with 40-59% right ICA stenosis.  Arrange follow-up carotid US. ?

## 2021-10-04 NOTE — Assessment & Plan Note (Signed)
Her blood pressure has improved since being placed on Florinef and midodrine.  However, she still has some hypotensive episodes.  I will reach out to primary care again to see if we can get her back to endocrinology for further evaluation. ?

## 2021-10-05 ENCOUNTER — Ambulatory Visit (INDEPENDENT_AMBULATORY_CARE_PROVIDER_SITE_OTHER): Payer: Medicare Other

## 2021-10-05 DIAGNOSIS — Z0000 Encounter for general adult medical examination without abnormal findings: Secondary | ICD-10-CM

## 2021-10-05 NOTE — Patient Instructions (Addendum)
Health Maintenance, Female ?Adopting a healthy lifestyle and getting preventive care are important in promoting health and wellness. Ask your health care provider about: ?The right schedule for you to have regular tests and exams. ?Things you can do on your own to prevent diseases and keep yourself healthy. ?What should I know about diet, weight, and exercise? ?Eat a healthy diet ? ?Eat a diet that includes plenty of vegetables, fruits, low-fat dairy products, and lean protein. ?Do not eat a lot of foods that are high in solid fats, added sugars, or sodium. ?Maintain a healthy weight ?Body mass index (BMI) is used to identify weight problems. It estimates body fat based on height and weight. Your health care provider can help determine your BMI and help you achieve or maintain a healthy weight. ?Get regular exercise ?Get regular exercise. This is one of the most important things you can do for your health. Most adults should: ?Exercise for at least 150 minutes each week. The exercise should increase your heart rate and make you sweat (moderate-intensity exercise). ?Do strengthening exercises at least twice a week. This is in addition to the moderate-intensity exercise. ?Spend less time sitting. Even light physical activity can be beneficial. ?Watch cholesterol and blood lipids ?Have your blood tested for lipids and cholesterol at 64 years of age, then have this test every 5 years. ?Have your cholesterol levels checked more often if: ?Your lipid or cholesterol levels are high. ?You are older than 64 years of age. ?You are at high risk for heart disease. ?What should I know about cancer screening? ?Depending on your health history and family history, you may need to have cancer screening at various ages. This may include screening for: ?Breast cancer. ?Cervical cancer. ?Colorectal cancer. ?Skin cancer. ?Lung cancer. ?What should I know about heart disease, diabetes, and high blood pressure? ?Blood pressure and heart  disease ?High blood pressure causes heart disease and increases the risk of stroke. This is more likely to develop in people who have high blood pressure readings or are overweight. ?Have your blood pressure checked: ?Every 3-5 years if you are 19-36 years of age. ?Every year if you are 48 years old or older. ?Diabetes ?Have regular diabetes screenings. This checks your fasting blood sugar level. Have the screening done: ?Once every three years after age 18 if you are at a normal weight and have a low risk for diabetes. ?More often and at a younger age if you are overweight or have a high risk for diabetes. ?What should I know about preventing infection? ?Hepatitis B ?If you have a higher risk for hepatitis B, you should be screened for this virus. Talk with your health care provider to find out if you are at risk for hepatitis B infection. ?Hepatitis C ?Testing is recommended for: ?Everyone born from [birth year] through 1963. ?Anyone with known risk factors for hepatitis C. ?Sexually transmitted infections (STIs) ?Get screened for STIs, including gonorrhea and chlamydia, if: ?You are sexually active and are younger than 64 years of age. ?You are older than 64 years of age and your health care provider tells you that you are at risk for this type of infection. ?Your sexual activity has changed since you were last screened, and you are at increased risk for chlamydia or gonorrhea. Ask your health care provider if you are at risk. ?Ask your health care provider about whether you are at high risk for HIV. Your health care provider may recommend a prescription medicine to help prevent HIV  infection. If you choose to take medicine to prevent HIV, you should first get tested for HIV. You should then be tested every 3 months for as long as you are taking the medicine. ?Pregnancy ?If you are about to stop having your period (premenopausal) and you may become pregnant, seek counseling before you get pregnant. ?Take 400 to 800  micrograms (mcg) of folic acid every day if you become pregnant. ?Ask for birth control (contraception) if you want to prevent pregnancy. ?Osteoporosis and menopause ?Osteoporosis is a disease in which the bones lose minerals and strength with aging. This can result in bone fractures. If you are 86 years old or older, or if you are at risk for osteoporosis and fractures, ask your health care provider if you should: ?Be screened for bone loss. ?Take a calcium or vitamin D supplement to lower your risk of fractures. ?Be given hormone replacement therapy (HRT) to treat symptoms of menopause. ?Follow these instructions at home: ?Alcohol use ?Do not drink alcohol if: ?Your health care provider tells you not to drink. ?You are pregnant, may be pregnant, or are planning to become pregnant. ?If you drink alcohol: ?Limit how much you have to: ?0-1 drink a day. ?Know how much alcohol is in your drink. In the [place] one drink equals one 12 oz bottle of beer (355 mL), one 5 oz glass of wine (148 mL), or one 1? oz glass of hard liquor (44 mL). ?Lifestyle ?Do not use any products that contain nicotine or tobacco. These products include cigarettes, chewing tobacco, and vaping devices, such as e-cigarettes. If you need help quitting, ask your health care provider. ?Do not use street drugs. ?Do not share needles. ?Ask your health care provider for help if you need support or information about quitting drugs. ?General instructions ?Schedule regular health, dental, and eye exams. ?Stay current with your vaccines. ?Tell your health care provider if: ?You often feel depressed. ?You have ever been abused or do not feel safe at home. ?Summary ?Adopting a healthy lifestyle and getting preventive care are important in promoting health and wellness. ?Follow your health care provider's instructions about healthy diet, exercising, and getting tested or screened for diseases. ?Follow your health care provider's instructions on monitoring your  cholesterol and blood pressure. ?This information is not intended to replace advice given to you by your health care provider. Make sure you discuss any questions you have with your health care provider. ?Document Revised: 11/07/2020 Document Reviewed: 11/07/2020 ?Elsevier Patient Education ? [place]. ? ?Colonoscopy, Adult ?A colonoscopy is a procedure to look at the entire large intestine. This procedure is done using a long, thin, flexible tube that has a camera on the end. ?You may have a colonoscopy: ?As a part of normal colorectal screening. ?If you have certain symptoms, such as: ?A low number of red blood cells in your blood (anemia). ?Diarrhea that does not go away. ?Pain in your abdomen. ?Blood in your stool. ?A colonoscopy can help screen for and diagnose medical problems, including: ?Tumors. ?Extra tissue that grows where mucus forms (polyps). ?Inflammation. ?Areas of bleeding. ?Tell your health care provider about: ?Any allergies you have. ?All medicines you are taking, including vitamins, herbs, eye drops, creams, and over-the-counter medicines. ?Any problems you or family members have had with anesthetic medicines. ?Any blood disorders you have. ?Any surgeries you have had. ?Any medical conditions you have. ?Any problems you have had with having bowel movements. ?Whether you are pregnant or may be pregnant. ?What are  the risks? ?Generally, this is a safe procedure. However, problems may occur, including: ?Bleeding. ?Damage to your intestine. ?Allergic reactions to medicines given during the procedure. ?Infection. This is rare. ?What happens before the procedure? ?Eating and drinking restrictions ?Follow instructions from your health care provider about eating or drinking restrictions, which may include: ?A few days before the procedure: ?Follow a low-fiber diet. ?Avoid nuts, seeds, dried fruit, raw fruits, and vegetables. ?1-3 days before the procedure: ?Eat only gelatin dessert or ice  pops. ?Drink only clear liquids, such as water, clear juice, clear broth or bouillon, black coffee or tea, or clear soft drinks or sports drinks. ?Avoid liquids that contain red or purple dye. ?The day of the proce

## 2021-10-05 NOTE — Progress Notes (Signed)
? ?Subjective:  ? Melinda [name] is a 66 y.o. female who presents for Medicare Annual (Subsequent) preventive examination. ? ?Review of Systems    ?Per HPI unless specifically indicated below ?  ? ?   ?Objective:  ?  ?Today's Vitals  ? 10/05/21 1141  ?PainSc: 4   ? ?There is no height or weight on file to calculate BMI. ? ? ?  09/28/2021  ?  6:53 AM 08/17/2021  ?  1:08 PM 04/20/2021  ? 10:58 AM 03/16/2021  ? 11:28 AM 02/21/2021  ?  1:05 PM 11/08/2020  ?  1:16 PM 08/26/2020  ? 10:26 AM  ?Advanced Directives  ?Does Patient Have a Medical Advance Directive? Yes No Yes Yes Yes Yes Yes  ?Type of Paramedic of [place];Living will      [place];Living will  ?Does patient want to make changes to medical advance directive? No - Patient declined        ?Copy of [place] in Chart? Yes - validated most recent copy scanned in chart (See row information)        ?Would patient like information on creating a medical advance directive?  No - Patient declined       ? ? ?Current Medications (verified) ?Outpatient Encounter Medications as of 10/05/2021  ?Medication Sig  ? Accu-Chek Softclix Lancets lancets USE TO CHECK BLOOD SUGAR 1 TIME DAILY  ? AMBULATORY NON FORMULARY MEDICATION Medication Name: CPAP MASK OF CHOICE FOR HOME DEVICE  ? amoxicillin-clavulanate (AUGMENTIN) 875-125 MG tablet Take 1 tablet by mouth 2 (two) times daily for 7 days.  ? Ascorbic Acid (VITAMIN C) 1000 MG tablet Take 1,000 mg by mouth daily.  ? ASPIRIN 81 PO Take 81 mg by mouth daily.  ? b complex vitamins tablet Take 1 tablet by mouth in the morning.  ? Biotin 10000 MCG TABS Take 10,000 mcg by mouth daily.  ? Blood Glucose Calibration (ACCU-CHEK GUIDE CONTROL) LIQD 1 each by In Vitro route as needed.  ? buPROPion (WELLBUTRIN XL) 150 MG 24 hr tablet Take 1 tablet (150 mg total) by mouth daily.  ? CALCIUM-MAGNESIUM-ZINC PO Take 3 capsules by mouth daily.  ? Cholecalciferol (VITAMIN D) 50 MCG (2000 UT)  tablet Take 2,000 Units by mouth daily.  ? CINNAMON PO Take 1,000 mg by mouth daily.  ? ezetimibe (ZETIA) 10 MG tablet TAKE 1 TABLET BY MOUTH EVERY DAY  ? fludrocortisone (FLORINEF) 0.1 MG tablet Take 1 tablet (0.1 mg total) by mouth daily.  ? glucosamine-chondroitin 500-400 MG tablet Take 2 tablets by mouth daily.   ? HYDROcodone-acetaminophen (NORCO/VICODIN) 5-325 MG tablet Take 1 tablet by mouth every 6 (six) hours as needed for up to 7 days for moderate pain.  ? Insulin Pen Needle 31G X 5 MM MISC BD Pen Needles- brand specific Inject insulin via insulin pen 6 x daily  ? Magnesium 400 MG CAPS Take 400 mg by mouth at bedtime.  ? midodrine (PROAMATINE) 5 MG tablet Take 1 tablet (5 mg total) by mouth 2 (two) times daily with a meal.  ? nitroGLYCERIN (NITROSTAT) 0.4 MG SL tablet PLACE 1 TABLET UNDER THE TONGUE EVERY 5 (FIVE) MINUTES AS NEEDED FOR CHEST PAIN.  ? NONFORMULARY OR COMPOUNDED ITEM 212.8 mcg by Epidural Infusion route. Medtronic Neuromodulation pump  ?Fentanyl 1,000.0 mcg/ml ?Baclofen 240.0 mcg/ml ?Bupivacaine 20.0 mg/ml ?Total daily dose 269.6 mcg/day  ? Omega-3 Fatty Acids (FISH OIL) 1200 MG CAPS Take 1,200 mg by mouth in the  morning.   ? [START ON 10/26/2021] oxyCODONE-acetaminophen (PERCOCET) 5-325 MG tablet Take 1 tablet by mouth every 8 (eight) hours as needed for severe pain. Each refill must last 30 days.  ? oxyCODONE-acetaminophen (PERCOCET) 5-325 MG tablet Take 1 tablet by mouth every 8 (eight) hours as needed for severe pain. Each refill must last 30 days.  ? [START ON 11/25/2021] oxyCODONE-acetaminophen (PERCOCET) 5-325 MG tablet Take 1 tablet by mouth every 8 (eight) hours as needed for severe pain. Each refill must last 30 days.  ? OZEMPIC, 0.25 OR 0.5 MG/DOSE, 2 MG/1.5ML SOPN INJECT 0.5 MG INTO THE SKIN ONCE A WEEK. FOR FIRST 4 WEEKS. THEN INCREASE DOSE TO 0.'5MG'$  WEEKLY  ? PARoxetine (PAXIL) 20 MG tablet TAKE 1/2 TABLET BY MOUTH DAILY IN THE MORNING  ? polyethylene glycol (MIRALAX / GLYCOLAX)  packet Take 17 g by mouth daily.  ? pregabalin (LYRICA) 150 MG capsule Take 1 capsule (150 mg total) by mouth 3 (three) times daily.  ? rosuvastatin (CRESTOR) 20 MG tablet TAKE 1 TABLET BY MOUTH EVERY DAY  ? tiZANidine (ZANAFLEX) 4 MG tablet TAKE 1 TABLET (4 MG TOTAL) BY MOUTH EVERY 8 (EIGHT) HOURS AS NEEDED FOR MUSCLE SPASMS  ? vitamin E 180 MG (400 UNITS) capsule Take 400 Units by mouth daily.  ? ACCU-CHEK GUIDE test strip USE 1 EACH BY OTHER ROUTE 3 (THREE) TIMES DAILY. USE AS INSTRUCTE  ? ?No facility-administered encounter medications on file as of 10/05/2021.  ? ? ?Allergies (verified) ?Patient has no known allergies.  ? ?History: ?Past Medical History:  ?Diagnosis Date  ? Amputation of great toe, right, traumatic ([place]) 05/30/2010  ? Amputation of second toe, right, traumatic ([place]) 09/2017  ? Anginal pain ([place])   ? Anxiety   ? Blue toes   ? 2nd toe on right foot, will get appt.  ? Bulging disc   ? CAD (coronary artery disease) 2009  ? s/p stent to LAD  ? 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 Chicken pox   ? Chronic fatigue   ? Chronic kidney disease   ? "problem with kidney filtration"  ? Coronary arteritis   ? Degenerative disc disease   ? Back, neck, hands, knees  ? Depression   ? Diabetes mellitus without complication ([place])   ? Facet joint disease   ? Fibromyalgia   ? Heart disease   ? Hyperlipidemia   ? IBS (irritable bowel syndrome)   ? MCL deficiency, knee   ? MRSA (methicillin resistant staph aureus) culture positive 2011  ? GREAT TOE RIGHT FOOT  ? Neuropathy 04/18/2010  ? Orthostatic hypotension 01/2020  ? Peripheral neuropathy   ? Renal insufficiency   ? Restless leg syndrome   ? Sepsis ([place]) 09/2013  ? Sleep apnea 08/17/2003  ? uses CPAP, sleep study at [facility] (mild to moderate)  ? Spinal stenosis   ? ?Past Surgical History:  ?Procedure Laterality Date  ? ABLATION  2007  ? UTERUS  ? ABLATION    ? HEART  ? AMPUTATION TOE  Right 02/01/2017  ? Procedure: AMPUTATION TOE-RIGHT 2ND MPJ;  Surgeon: Albertine Patricia, DPM;  Location: [facility];  Service: Podiatry;  Laterality: Right;  ? ANTERIOR CERVICAL DECOMP/DISCECTOMY FUSION N/A 07/28/2014  ? Procedure: ANTERIOR CERVICAL DECOMPRESSION/DISCECTOMY FUSION CERVICAL 3-4,4-5,5-6 LEVELS WITH INSTRUMENTATION AND ALLOGRAFT;  Surgeon: Sinclair Ship, MD;  Location: [place];  Service: Orthopedics;  Laterality: N/A;  Anterior cervical  decompression fusion, cervical 3-4, cervical 4-5, cervical 5-6 with instrumentation and allograft  ? BACK SURGERY    ? X 3 1979, 1994, 1995  ? CARPAL TUNNEL RELEASE Right   ? CHOLECYSTECTOMY  2003  ? CORONARY ANGIOPLASTY  2008  ? EXCISION PARTIAL PHALANX Left 09/28/2021  ? Procedure: EXCISION PARTIAL PHALANX - FIRST;  Surgeon: Caroline More, DPM;  Location: [place];  Service: Podiatry;  Laterality: Left;  ? EYE SURGERY Bilateral 2013  ? Eyelid lift   ? FOOT SURGERY Right   ? BIG TOE  ? FOOT SURGERY Bilateral   ? PLANTAR FASCIITIS  ? FOOT SURGERY Right   ? 2ND TOE  ? GALLBLADDER SURGERY    ? HAMMER TOE SURGERY Right 10/17/2017  ? Procedure: HAMMER TOE CORRECTION-4TH TOE;  Surgeon: Albertine Patricia, DPM;  Location: [place];  Service: Podiatry;  Laterality: Right;  LMA- WITH LOCAL ?Diabetic - diet controlled  ? HAND SURGERY Left   ? HAND SURGEY Left   ? HEART STENT  2009  ? LAD  ? INFUSION PUMP IMPLANTATION    ? X2 with morphine and baclofen  ? INTRATHECAL PUMP REVISION N/A 04/25/2018  ? Procedure: Intrathecal pump replacement;  Surgeon: Clydell Hakim, MD;  Location: [place];  Service: Neurosurgery;  Laterality: N/A;  right  ? INTRATHECAL PUMP REVISION Right 04/25/2018  ? INTRATHECAL PUMP REVISION N/A 12/12/2018  ? Procedure: Intrathecal pump revision with exploration of pocket;  Surgeon: Clydell Hakim, MD;  Location: [place];  Service: Neurosurgery;  Laterality: N/A;  Intrathecal pump revision with exploration of pocket  ? INTRATHECAL PUMP REVISION  N/A 03/11/2020  ? Procedure: INTRATHECAL PUMP REVISION;  Surgeon: [name], MD;  Location: [facility];  Service: Neurosurgery;  Laterality: N/A;  ? IRRIGATION AND DEBRIDEMENT FOOT Right 02/23/2017  ?

## 2021-10-05 NOTE — Progress Notes (Signed)
I connected with Melinda [name] today by telephone and verified that I am speaking with the correct person using two identifiers. ?Location patient: home ?Location provider: work ?Persons participating in the virtual visit: [name], [name], [name] ?  ?I discussed the limitations, risks, security and privacy concerns of performing an evaluation and management service by telephone and the availability of in person appointments. I also discussed with the patient that there may be a patient responsible charge related to this service. The patient expressed understanding and verbally consented to this telephonic visit.  ?  ?I ?

## 2021-10-06 ENCOUNTER — Encounter (HOSPITAL_COMMUNITY): Payer: Self-pay | Admitting: *Deleted

## 2021-10-10 ENCOUNTER — Other Ambulatory Visit: Payer: Self-pay | Admitting: Internal Medicine

## 2021-10-10 NOTE — Telephone Encounter (Signed)
Requested Prescriptions  ?Pending Prescriptions Disp Refills  ?? PARoxetine (PAXIL) 20 MG tablet [Pharmacy Med Name: PAROXETINE HCL 20 MG TABLET] 45 tablet 0  ?  Sig: TAKE 1/2 TABLET BY MOUTH DAILY IN THE MORNING  ?  ? Psychiatry:  Antidepressants - SSRI Passed - 10/10/2021  8:33 AM  ?  ?  Passed - Completed PHQ-2 or PHQ-9 in the last 360 days  ?  ?  Passed - Valid encounter within last 6 months  ?  Recent Outpatient Visits   ?      ? 2 weeks ago Preop general physical exam  ? [place], PA-C  ? 1 month ago Orthostasis  ? [facility] [place], [place] W, NP  ? 3 months ago Type 2 diabetes mellitus with diabetic mononeuropathy, without long-term current use of insulin ([place])  ? [place], NP  ? 4 months ago Stuffy and runny nose  ? [facility] [place], [place] W, NP  ? 9 months ago Type 2 diabetes mellitus with diabetic mononeuropathy, without long-term current use of insulin ([place])  ? [facility] [place], Coralie Keens, NP  ?  ?  ?Future Appointments   ?        ? In 3 months Skains, Thana Farr, MD [place], [facility]  ?  ? ?  ?  ?  ? ?

## 2021-10-11 ENCOUNTER — Other Ambulatory Visit: Payer: Self-pay | Admitting: Physician Assistant

## 2021-10-11 ENCOUNTER — Other Ambulatory Visit: Payer: Self-pay

## 2021-10-11 DIAGNOSIS — I6522 Occlusion and stenosis of left carotid artery: Secondary | ICD-10-CM

## 2021-10-11 DIAGNOSIS — E7800 Pure hypercholesterolemia, unspecified: Secondary | ICD-10-CM

## 2021-10-11 DIAGNOSIS — R072 Precordial pain: Secondary | ICD-10-CM

## 2021-10-11 DIAGNOSIS — I951 Orthostatic hypotension: Secondary | ICD-10-CM

## 2021-10-11 MED ORDER — PAIN MANAGEMENT IT PUMP REFILL: 1.0000 | Freq: Once | INTRATHECAL | 0 refills | Status: AC

## 2021-10-12 ENCOUNTER — Ambulatory Visit (HOSPITAL_COMMUNITY): Payer: Medicare Other | Attending: Physician Assistant

## 2021-10-12 DIAGNOSIS — E7800 Pure hypercholesterolemia, unspecified: Secondary | ICD-10-CM | POA: Diagnosis not present

## 2021-10-12 DIAGNOSIS — I951 Orthostatic hypotension: Secondary | ICD-10-CM

## 2021-10-12 DIAGNOSIS — I2510 Atherosclerotic heart disease of native coronary artery without angina pectoris: Secondary | ICD-10-CM | POA: Insufficient documentation

## 2021-10-12 DIAGNOSIS — R072 Precordial pain: Secondary | ICD-10-CM | POA: Diagnosis not present

## 2021-10-12 LAB — MYOCARDIAL PERFUSION IMAGING
LV dias vol: 79 mL (ref 46–106)
LV sys vol: 25 mL
Nuc Stress EF: 69 %
Peak HR: 81 {beats}/min
Rest HR: 66 {beats}/min
Rest Nuclear Isotope Dose: 10.5 mCi
SDS: 1
SRS: 0
SSS: 1
ST Depression (mm): 0 mm
Stress Nuclear Isotope Dose: 32.8 mCi
TID: 1.11

## 2021-10-12 MED ORDER — TECHNETIUM TC 99M TETROFOSMIN IV KIT
32.8000 | PACK | Freq: Once | INTRAVENOUS | Status: AC | PRN
  Administered 2021-10-12: 32.8 via INTRAVENOUS
  Filled 2021-10-12: qty 33

## 2021-10-12 MED ORDER — TECHNETIUM TC 99M TETROFOSMIN IV KIT
10.5000 | PACK | Freq: Once | INTRAVENOUS | Status: AC | PRN
  Administered 2021-10-12: 10.5 via INTRAVENOUS
  Filled 2021-10-12: qty 11

## 2021-10-12 MED ORDER — REGADENOSON 0.4 MG/5ML IV SOLN
0.4000 mg | Freq: Once | INTRAVENOUS | Status: AC
  Administered 2021-10-12: 0.4 mg via INTRAVENOUS

## 2021-10-15 ENCOUNTER — Encounter: Payer: Self-pay | Admitting: Pain Medicine

## 2021-10-16 ENCOUNTER — Encounter: Payer: Self-pay | Admitting: Pain Medicine

## 2021-10-17 ENCOUNTER — Encounter: Payer: Self-pay | Admitting: Pain Medicine

## 2021-10-20 ENCOUNTER — Encounter: Payer: Self-pay | Admitting: Physician Assistant

## 2021-10-20 ENCOUNTER — Telehealth: Payer: Self-pay | Admitting: *Deleted

## 2021-10-20 ENCOUNTER — Ambulatory Visit (HOSPITAL_COMMUNITY)
Admission: RE | Admit: 2021-10-20 | Discharge: 2021-10-20 | Disposition: A | Discharge status: Home / Self Care | Payer: Medicare Other | Source: Ambulatory Visit | Attending: Internal Medicine | Admitting: Internal Medicine

## 2021-10-20 DIAGNOSIS — I6522 Occlusion and stenosis of left carotid artery: Secondary | ICD-10-CM | POA: Diagnosis not present

## 2021-10-20 DIAGNOSIS — I951 Orthostatic hypotension: Secondary | ICD-10-CM

## 2021-10-20 DIAGNOSIS — E7800 Pure hypercholesterolemia, unspecified: Secondary | ICD-10-CM | POA: Insufficient documentation

## 2021-10-20 DIAGNOSIS — R072 Precordial pain: Secondary | ICD-10-CM | POA: Diagnosis not present

## 2021-10-20 NOTE — Telephone Encounter (Signed)
-----   Message from Liliane Shi, [place] sent at 10/20/2021 12:53 PM EDT ----- ?Stable carotid artery stenosis.  R ICA with 40-59% stenosis.  No change since 2021.   ?PLAN:  ?-Continue current medications/treatment plan and follow up as scheduled.  ?-Repeat 1 year ?-Send copy to PCP ?Richardson Dopp, PA-C    ?10/20/2021 12:51 PM   ? ?

## 2021-10-22 NOTE — Progress Notes (Signed)
PROVIDER NOTE: Information contained herein reflects review and annotations entered in association with encounter. Interpretation of such information and data should be left to medically-trained personnel. Information provided to patient can be located elsewhere in the medical record under "Patient Instructions". Document created using STT-dictation technology, any transcriptional errors that may result from process are unintentional.  ?  ?Patient: Melinda [name]  Service Category: E/M  Provider: Gaspar Cola, MD  ?DOB: [birth year]  DOS: 10/23/2021  Specialty: Interventional Pain Management  ?MRN: [id number]  Setting: Ambulatory outpatient  PCP: Melinda Fenton, NP  ?Type: Established Patient    Referring Provider: Jearld Fenton, NP  ?Location: Office  Delivery: Face-to-face    ? ?HPI  ?[name], a 66 y.o. year old female, is here today because of her Abdominal wall seroma, sequela [S30.1XXS]. Melinda [name] primary complain today is Pain (General pain, pump area uncomfortable) ?Last encounter: My last encounter with her was on 09/26/2021. 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); Lumbar spondylosis; Lumbar spinal stenosis w/ neurogenic claudication; Abnormal MRI, lumbar spine (11/18/2019); Lumbar facet hypertrophy (Multilevel); Lumbar foraminal stenosis; Lumbar lateral recess stenosis; Chronic right-sided low back pain w/o sciatica from IT catheter anchor.; Chronic low back pain (Bilateral) w/o sciatica; DDD (degenerative disc disease), lumbosacral; Thoracic facet hypertrophy/arthropathy (Multilevel) (Bilateral);  Thoracic facet syndrome (Multilevel) (Bilateral); Pain and numbness of upper extremity (Bilateral); Cervicalgia; History of carpal tunnel surgery (Bilateral); Abdominal wall seroma; Recurrent right knee instability; 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 ?Pain Assessment: Severity of Chronic pain is reported as a 2 /10. Location: Other (Comment) (Pump area) Right/denies. Onset: More than a month ago. Quality: Aching, Heaviness. Timing: Intermittent. Modifying factor(s): pump. ?Vitals:  height is '5\' 9"'$  (1.753 m) and weight is 208 lb (94.3 kg). Her temperature is 97.4 ?F (36.3 ?C) (abnormal). Her blood pressure is 120/64 and her pulse is 71. Her respiration is 16 and oxygen saturation is 98%.  ? ?Reason for encounter: Recurrence of abdominal wall seroma.  This seems to be a recurrent problem with this patient.  The patient has undergone multiple surgeries in an effort to eliminate the seroma, but all have been unsuccessful.  In addition, the stitches that hold the pump in place have also become loose, and despite multiple surgical efforts to reenter the device, it tends to always come loose. ? ?The patient comes into the clinic today with accumulation of fluid around her intrathecal pump.  The area was prepped and the accumulated fluid was drained.  This fluid had no evidence of blood, or infection.  Fluid was clear and transparent with a slight hint of yellow coloration.  Fluid was sent to lab for analysis.  This fluid was drained under sterile conditions using a 30 mL syringe and sterile catheter.  1 mL of 2% lidocaine was injected into the skin and an 18-gauge was used to access the seroma.  Total volume drained: 480 cc ? ?Today the patient returns for drainage of recurrent  seroma. ? ?Pharmacotherapy Assessment  ?Analgesic: Oxycodone 5 mg, 1 tab PO q 8 hrs (15  mg/day of oxycodone) + Intrathecal PF-Fentanyl ?MME/day: 22.5 mg/day (oral).  ? ?Monitoring: ?[place] PMP: PDMP reviewed during this encounter.       ?Pharmacotherapy: No side-effects or adverse reactions reported. ?Compliance: No problems identified. ?Effectiveness: Clinically acceptable. ? ?Melinda Specking, RN  10/23/2021  2:42 PM  Sign when Signing Visit ?Safety precautions to be maintained throughout the outpatient stay will include: orient to surroundings, keep bed in low position, maintain call bell within reach at all times, provide assistance with transfer out of bed and ambulation.  ? ?1410 Attempted to get read out on pump. Unable due to fluid around pump. ?1430 481m drawn off IT site. Will send to lab. MD instructed patient to wear binder. Pt with understanding ?   UDS:  ?Summary  ?Date Value Ref Range Status  ?06/20/2021 Note  Final  ?  Comment:  ?  ==================================================================== ?[employer] Select 13 (MW) ?==================================================================== ?Test                             Result       Flag       Units ? ?Drug Present and Declared for Prescription Verification ?  Oxycodone                      659          EXPECTED   ng/mg creat ?  Oxymorphone                    161          EXPECTED   ng/mg creat ?  Noroxycodone                   3382         EXPECTED   ng/mg creat ?  Noroxymorphone                 53           EXPECTED   ng/mg creat ?   Sources of oxycodone are scheduled prescription medications. ?   Oxymorphone, noroxycodone, and noroxymorphone are expected ?   metabolites of oxycodone. Oxymorphone is also available as a ?   scheduled prescription medication. ? ?Drug Present not Declared for Prescription Verification ?  Carboxy-THC                    136          UNEXPECTED ng/mg creat ?   Carboxy-THC is a metabolite of tetrahydrocannabinol  (THC). Source of ?   THC is most commonly herbal marijuana or marijuana-based products, ?   but THC is also present in a scheduled prescription medication. ?   Trace amounts of THC can be present in hemp and cannabidiol (CBD) ?   products. This test is not intended to distinguish between delta-9- ?   tetrahydrocannabinol, the predominant form of THC in most herbal or ?   marijuana-based products, and delta-8-tetrahydrocannabinol. ? ?  Fentanyl                       5            UNEXPECTED ng/mg creat ?  Norfentanyl                    108          UNEXPECTED ng/mg creat ?   Source  of fentanyl is a scheduled prescription medication, including ?   IV, patch, and transmucosal formulations. Norfentanyl is an expected ?   metabolite of fentanyl. ? ?==================================================================== ?Test                      Result    Flag   Units      Ref Range ?  Creatinine              198              mg/dL      >=20 ?==================================================================== ?Declared Medications: ? The flagging and interpretation on this report are based on the ? following declared medications.  Unexpected results may arise from ? inaccuracies in the declared medications. ? ? **Note: The testing scope of this panel includes these medications: ? ? Oxycodone (Percocet) ? ? **Note: The testing scope of this panel does not include the ? following reported medications: ? ? Acetaminophen (Percocet) ? Aspirin ? Biotin ? Bupropion (Wellbutrin) ? Calcium ? Chondroitin ? Ezetimibe (Zetia) ? Fish Oil ? Glucosamine ? Insulin ? Magnesium ? Nitroglycerin (Nitrostat) ? Paroxetine (Paxil) ? Polyethylene Glycol ? Pregabalin (Lyrica) ? Rosuvastatin (Crestor) ? Semaglutide (Ozempic) ? Tizanidine (Zanaflex) ? Vitamin B ? Vitamin C ? Vitamin D ? Vitamin E ? Zinc ?==================================================================== ?For clinical consultation, please call [phone]. ?==================================================================== ?  ?  ? ?ROS  ?Constitutional: Denies any fever or chills ?Gastrointestinal: No reported hemesis, hematochezia, vomiting, or acute GI distress ?Musculoskeletal: Denies any acute

## 2021-10-23 ENCOUNTER — Ambulatory Visit (HOSPITAL_BASED_OUTPATIENT_CLINIC_OR_DEPARTMENT_OTHER): Payer: Medicare Other | Admitting: Pain Medicine

## 2021-10-23 ENCOUNTER — Other Ambulatory Visit: Payer: Self-pay | Admitting: Internal Medicine

## 2021-10-23 ENCOUNTER — Other Ambulatory Visit
Admission: RE | Admit: 2021-10-23 | Discharge: 2021-10-23 | Disposition: A | Discharge status: Home / Self Care | Payer: Medicare Other | Source: Ambulatory Visit | Attending: Pain Medicine | Admitting: Pain Medicine

## 2021-10-23 VITALS — BP 120/64 | HR 71 | Temp 97.4°F | Resp 16 | Ht 69.0 in | Wt 208.0 lb

## 2021-10-23 DIAGNOSIS — G894 Chronic pain syndrome: Secondary | ICD-10-CM | POA: Insufficient documentation

## 2021-10-23 DIAGNOSIS — M5030 Other cervical disc degeneration, unspecified cervical region: Secondary | ICD-10-CM | POA: Insufficient documentation

## 2021-10-23 DIAGNOSIS — Y839 Surgical procedure, unspecified as the cause of abnormal reaction of the patient, or of later complication, without mention of misadventure at the time of the procedure: Secondary | ICD-10-CM | POA: Diagnosis not present

## 2021-10-23 DIAGNOSIS — G96198 Other disorders of meninges, not elsewhere classified: Secondary | ICD-10-CM | POA: Insufficient documentation

## 2021-10-23 DIAGNOSIS — M797 Fibromyalgia: Secondary | ICD-10-CM | POA: Diagnosis not present

## 2021-10-23 DIAGNOSIS — F1290 Cannabis use, unspecified, uncomplicated: Secondary | ICD-10-CM | POA: Diagnosis not present

## 2021-10-23 DIAGNOSIS — M5442 Lumbago with sciatica, left side: Secondary | ICD-10-CM | POA: Diagnosis not present

## 2021-10-23 DIAGNOSIS — M96843 Postprocedural seroma of a musculoskeletal structure following other procedure: Secondary | ICD-10-CM

## 2021-10-23 DIAGNOSIS — S301XXS Contusion of abdominal wall, sequela: Secondary | ICD-10-CM | POA: Insufficient documentation

## 2021-10-23 DIAGNOSIS — M5441 Lumbago with sciatica, right side: Secondary | ICD-10-CM | POA: Diagnosis not present

## 2021-10-23 DIAGNOSIS — Z978 Presence of other specified devices: Secondary | ICD-10-CM

## 2021-10-23 DIAGNOSIS — Z79899 Other long term (current) drug therapy: Secondary | ICD-10-CM

## 2021-10-23 DIAGNOSIS — M160 Bilateral primary osteoarthritis of hip: Secondary | ICD-10-CM | POA: Diagnosis not present

## 2021-10-23 DIAGNOSIS — G8929 Other chronic pain: Secondary | ICD-10-CM

## 2021-10-23 DIAGNOSIS — M47892 Other spondylosis, cervical region: Secondary | ICD-10-CM | POA: Insufficient documentation

## 2021-10-23 DIAGNOSIS — M7918 Myalgia, other site: Secondary | ICD-10-CM

## 2021-10-23 DIAGNOSIS — K91872 Postprocedural seroma of a digestive system organ or structure following a digestive system procedure: Secondary | ICD-10-CM | POA: Diagnosis not present

## 2021-10-23 LAB — GLUCOSE, PLEURAL OR PERITONEAL FLUID: Glucose, Fluid: <20 mg/dL

## 2021-10-23 LAB — PATHOLOGIST SMEAR REVIEW

## 2021-10-23 LAB — BODY FLUID CELL COUNT WITH DIFFERENTIAL
Eos, Fluid: 0 %
Lymphs, Fluid: 92 %
Monocyte-Macrophage-Serous Fluid: 7 %
Neutrophil Count, Fluid: 1 %
Other Cells, Fluid: 0 %
Total Nucleated Cell Count, Fluid: 22 cu mm

## 2021-10-23 LAB — PROTEIN, PLEURAL OR PERITONEAL FLUID: Total protein, fluid: <3 g/dL

## 2021-10-23 MED ORDER — LIDOCAINE HCL (PF) 1 % IJ SOLN
INTRAMUSCULAR | Status: AC
  Filled 2021-10-23: qty 5

## 2021-10-23 NOTE — Progress Notes (Signed)
Safety precautions to be maintained throughout the outpatient stay will include: orient to surroundings, keep bed in low position, maintain call bell within reach at all times, provide assistance with transfer out of bed and ambulation.  ? ?1410 Attempted to get read out on pump. Unable due to fluid around pump. ?1430 41m drawn off IT site. Will send to lab. MD instructed patient to wear binder. Pt with understanding ?

## 2021-10-24 ENCOUNTER — Telehealth: Payer: Self-pay

## 2021-10-24 ENCOUNTER — Encounter: Payer: Self-pay | Admitting: Pain Medicine

## 2021-10-24 LAB — COMP. METABOLIC PANEL (12)
AST: 26 IU/L (ref 0–40)
Albumin/Globulin Ratio: 1.7 (ref 1.2–2.2)
Albumin: 4.7 g/dL (ref 3.8–4.8)
Alkaline Phosphatase: 103 IU/L (ref 44–121)
BUN/Creatinine Ratio: 16 (ref 12–28)
BUN: 21 mg/dL (ref 8–27)
Bilirubin Total: 0.4 mg/dL (ref 0.0–1.2)
Calcium: 9.8 mg/dL (ref 8.7–10.3)
Chloride: 99 mmol/L (ref 96–106)
Creatinine, Ser: 1.29 mg/dL — ABNORMAL HIGH (ref 0.57–1.00)
Globulin, Total: 2.7 g/dL (ref 1.5–4.5)
Glucose: 61 mg/dL — ABNORMAL LOW (ref 70–99)
Potassium: 4.8 mmol/L (ref 3.5–5.2)
Sodium: 139 mmol/L (ref 134–144)
Total Protein: 7.4 g/dL (ref 6.0–8.5)
eGFR: 46 mL/min/{1.73_m2} — ABNORMAL LOW (ref >59)

## 2021-10-24 LAB — PROTEIN, BODY FLUID (OTHER): Total Protein, Body Fluid Other: 0.6 g/dL

## 2021-10-24 NOTE — Telephone Encounter (Signed)
Requested medication (s) are due for refill today: Yes ? ?Requested medication (s) are on the active medication list: Yes ? ?Last refill:  07/24/21 ? ?Future visit scheduled: No ? ?Notes to clinic:  See request. ? ? ? ?Requested Prescriptions  ?Pending Prescriptions Disp Refills  ? tiZANidine (ZANAFLEX) 4 MG tablet [Pharmacy Med Name: TIZANIDINE HCL 4 MG TABLET] 270 tablet 0  ?  Sig: TAKE 1 TABLET (4 MG TOTAL) BY MOUTH EVERY 8 (EIGHT) HOURS AS NEEDED FOR MUSCLE SPASMS  ?  ? Not Delegated - Cardiovascular:  Alpha-2 Agonists - tizanidine Failed - 10/23/2021 10:55 AM  ?  ?  Failed - This refill cannot be delegated  ?  ?  Passed - Valid encounter within last 6 months  ?  Recent Outpatient Visits   ? ?      ? 1 month ago Preop general physical exam  ? [place], PA-C  ? 1 month ago Orthostasis  ? [facility] [place], [place] W, NP  ? 4 months ago Type 2 diabetes mellitus with diabetic mononeuropathy, without long-term current use of insulin ([place])  ? [place], NP  ? 4 months ago Stuffy and runny nose  ? [facility] [place], [place] W, NP  ? 10 months ago Type 2 diabetes mellitus with diabetic mononeuropathy, without long-term current use of insulin ([place])  ? [facility] [place], Coralie Keens, NP  ? ?  ?  ?Future Appointments   ? ?        ? In 3 months Skains, Thana Farr, MD [place], [facility]  ? ?  ? ? ?  ?  ?  ? ?

## 2021-10-24 NOTE — Telephone Encounter (Signed)
Post procedure phone call.  LM 

## 2021-10-27 LAB — TOXASSURE SELECT 13 (MW), URINE

## 2021-11-05 NOTE — Progress Notes (Signed)
PROVIDER NOTE: Interpretation of information contained herein should be left to medically-trained personnel. Specific patient instructions are provided elsewhere under "Patient Instructions" section of medical record. This document was created in part using STT-dictation technology, any transcriptional errors that may result from this process are unintentional.  ?Patient: Melinda [name] ?Type: Established ?DOB: 1955-12-14 ?MRN: [id number] ?PCP: Jearld Fenton, NP  Service: Procedure ?DOS: 11/07/2021 ?Setting: Ambulatory ?Location: Ambulatory outpatient facility ?Delivery: Face-to-face Provider: Gaspar Cola, MD ?Specialty: Interventional Pain Management ?Specialty designation: 09 ?Location: Outpatient facility ?Ref. Prov.: Jearld Fenton, NP   ? ?Primary Reason for Visit: Interventional Pain Management Treatment. ?CC: Pain ? ?Procedure:          ?Type: Management of Intrathecal Drug Delivery System (IDDS) - Reservoir Refill [id number]). No rate change.  ?Indications: ?1. Chronic pain syndrome   ?2. 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 Chronic lower extremity pain (Left)   ?4. Chronic lumbar radicular pain (Left)   ?5. DDD (degenerative disc disease), lumbosacral   ?6. Degeneration of lumbar intervertebral disc   ?7. Epidural fibrosis   ?8. Failed back surgical syndrome   ?9. Lumbar facet syndrome   ?10. Presence of intrathecal pump (Medtronic intrathecal programmable pump) (40 mL pump)   ?11. Presence of functional implant (Medtronic programmable intrathecal pump) (Right abdominal area)   ?12. Encounter for interrogation of infusion pump   ?13. Encounter for adjustment or management of infusion pump   ?14. Chronic use of opiate for therapeutic purpose   ?15. Encounter for medication management   ?16. Encounter for chronic pain management   ? ?Pain Assessment: ?Self-Reported Pain Score: 5 /10             ?Reported level is compatible with observation.       ? ?RTCB: 02/23/2022 ?Nonopioids  transfer 05/05/2020: Lyrica & Zanaflex ? ?The patient returns to the clinic today for an intrathecal pump refill however, upon arrival, she indicates having a flareup of her low back pain, lower extremity pain, and having significant neck pain and headaches.  On the patient's last visit to the office on 10/23/2021 she had accumulated a significant amount of fluid around her intrathecal pump.  It was thought to be a seroma and on that particular date we drained it under sterile conditions removing 480 cc of a clear fluid which was sent to be analyzed.  The fluid came back negative for cancer or any evidence of infection.  However, the patient comes in today again having accumulated a significant amount of fluid.  Today again we drained it under sterile conditions and I removed another 430 cc of fluid.  Since the patient's last pump replacement, we have had a lot of problems with this device where it has accumulated fluid around it requiring several revisions.  The anchors on the pump have also detached several times, also requiring revision.  Because despite all of these revisions he has continued to accumulate fluid in the pump continues to come loose from its anchors, the patient decided to continue SCS and keep it under observation.  At that time, I mentioned to the patient that the risk of having the pump without any anchors was that it could continue flipping and this motion could cause the catheter to be pulled out of the intrathecal space.  Because of this more frequent accumulation of fluid, increasing the patient's pain, and severe headaches that have worsened in past couple of weeks, I have high suspicions that the  catheter may have migrated out of its correct location and perhaps the patient may be having a CSF leak.  Today I had the patient lay flat on the bed and upon doing so her headaches completely went away suggesting that she is in fact having a spinal headache.  For this reason, I decided to do an  x-ray of her intrathecal pump where we confirmed that again he had flipped and the pump port was actually facing inward.  The pump had to be manually flipped back into position and inspected under fluoroscopic guidance.  Looking at the pump, I cannot see the intrathecal catheter and therefore under sterile conditions and after having prepped the area I accessed the sideport and aspirated to mL of fluid that seem to be very similar to that found around the pump.  I then proceeded to inject 2 mL of contrast which failed to paint the intrathecal catheter.  In fact, it seemed as it had leaked around the pump.  For this reason, we went ahead and ordered CT scans of the patient's thoracic, lumbar, and abdominal areas to see if we could better evaluate the implant in the continuity of the intrathecal catheter. ? ?Since the medication the pump has been there for 3 months, we went ahead and emptied the reservoir and refilled it with the new medication.  Unfortunately, while the reservoir was being accessed to be refilled, I noticed that one of the nurses assigned to the task had broken protocol and had touched the equipment with a nonsterile gloves.  I immediately took over, cleaned everything with alcohol, and then completed the procedure while also giving the patient 1 g of Ancef IV for prophylaxis.  The patient was informed of the breach in sterile technique and oral antibiotics were also sent to her pharmacy for her to prophylactically take in order to avoid contamination and infection. ? ?In terms of the catheter study, it is my believe that there has been either migration, disconnection, or breakage of the intrathecal catheter and the system is no longer delivering medication intrathecally.  For this reason I have ordered a CT scan of the lumbar spine, thoracic spine, as well has a CT of the abdominal area around the pump to evaluate the connection between the pump and the catheter and its continuity. ? ?In terms of  the long-term plan, if my suspicions are confirmed, I have recommended to the patient that we remove the current intrathecal pump and catheter, allow for the right abdominal pocket to close and completely collapsed, switch her to oral medications to treat the pain meanwhile, and later on once she has completely healed we will revisit the possibility of putting in another intrathecal pump, perhaps on the opposite side of the anterior abdominal wall. ?  ? ?Intrathecal Drug Delivery System (IDDS)  ?Pump Device:  ?Manufacturer: Medtronic ?Model: Synchromed II ?Model No.: [id number] ?Serial No.: [id number] ?Delivery Route: Intrathecal ?Type: Programmable  ?Volume (mL): 40 mL reservoir ?Priming Volume: n/a  ?Calibration Constant: 113.0  ?MRI compatibility: Conditional  ? ?Implant Details:  ?Date: 03/11/2020  ?Implanter: [name], MD  ?Contact Information: [facility], [place], [place] ?Last Revision/Replacement: 03/11/2020 ?Estimated Replacement Date: Jul 2026  ?Implant Site: Abdominal ?Laterality: Right ? ?Catheter: ?Manufacturer: Medtronic ?Model: Ascenda ?Model No.: 4098  ?Serial No.: [id number]  ?Implanted Length (cm): 102.1  ?Catheter Volume (mL): 0.225  ?Tip Location (Level): T6-7 ?Canal Access Site: T12-L1 ? ?Drug content:  ?Primary Medication Class: Opioid  ?Medication: PF-Fentanyl  ?Concentration: 500 mcg/mL ? ?  Secondary Medication Class: Local Anesthetic  ?Medication: PF-Bupivacaine  ?Concentration: 10 mg/mL ? ?Tertiary Medication Class: Antispasmodic  ?Medication: PF-Baclofen  ?Concentration: 120 mcg/mL ? ?Fourth Medication Class: none  ? ?PA parameters (PCA-mode):  ?Mode: Off (Inactive) ? ?Programming:  ?Type: Simple continuous.  ?Medication, Concentration, Infusion Program, & Delivery Rate: For up-to-date details please see most recent scanned programming printout. ?  ?Changes:  ?Medication Change: None at this point ?Rate Change: No change in rate ? ?Reported side-effects or adverse reactions: None  reported ? ?Effectiveness: Described as relatively effective, allowing for increase in activities of daily living (ADL) ?Clinically meaningful improvement in function (CMIF): Sustained CMIF goals met ? ?Pl

## 2021-11-06 ENCOUNTER — Encounter: Payer: Self-pay | Admitting: Pain Medicine

## 2021-11-07 ENCOUNTER — Ambulatory Visit (HOSPITAL_BASED_OUTPATIENT_CLINIC_OR_DEPARTMENT_OTHER): Payer: Medicare Other | Admitting: Pain Medicine

## 2021-11-07 ENCOUNTER — Ambulatory Visit
Admission: RE | Admit: 2021-11-07 | Discharge: 2021-11-07 | Disposition: A | Discharge status: Home / Self Care | Payer: Medicare Other | Source: Ambulatory Visit | Attending: Pain Medicine | Admitting: Pain Medicine

## 2021-11-07 ENCOUNTER — Encounter: Payer: Self-pay | Admitting: Pain Medicine

## 2021-11-07 VITALS — BP 101/53 | HR 78 | Temp 97.3°F | Resp 18 | Ht 69.0 in | Wt 205.0 lb

## 2021-11-07 DIAGNOSIS — M47816 Spondylosis without myelopathy or radiculopathy, lumbar region: Secondary | ICD-10-CM | POA: Insufficient documentation

## 2021-11-07 DIAGNOSIS — M5134 Other intervertebral disc degeneration, thoracic region: Secondary | ICD-10-CM

## 2021-11-07 DIAGNOSIS — G971 Other reaction to spinal and lumbar puncture: Secondary | ICD-10-CM | POA: Insufficient documentation

## 2021-11-07 DIAGNOSIS — Z79891 Long term (current) use of opiate analgesic: Secondary | ICD-10-CM | POA: Diagnosis not present

## 2021-11-07 DIAGNOSIS — M79605 Pain in left leg: Secondary | ICD-10-CM | POA: Insufficient documentation

## 2021-11-07 DIAGNOSIS — Z969 Presence of functional implant, unspecified: Secondary | ICD-10-CM | POA: Diagnosis not present

## 2021-11-07 DIAGNOSIS — M961 Postlaminectomy syndrome, not elsewhere classified: Secondary | ICD-10-CM | POA: Insufficient documentation

## 2021-11-07 DIAGNOSIS — G8929 Other chronic pain: Secondary | ICD-10-CM

## 2021-11-07 DIAGNOSIS — G894 Chronic pain syndrome: Secondary | ICD-10-CM | POA: Diagnosis not present

## 2021-11-07 DIAGNOSIS — M96843 Postprocedural seroma of a musculoskeletal structure following other procedure: Secondary | ICD-10-CM

## 2021-11-07 DIAGNOSIS — M542 Cervicalgia: Secondary | ICD-10-CM | POA: Diagnosis not present

## 2021-11-07 DIAGNOSIS — M5416 Radiculopathy, lumbar region: Secondary | ICD-10-CM | POA: Diagnosis not present

## 2021-11-07 DIAGNOSIS — G96198 Other disorders of meninges, not elsewhere classified: Secondary | ICD-10-CM | POA: Diagnosis not present

## 2021-11-07 DIAGNOSIS — M5441 Lumbago with sciatica, right side: Secondary | ICD-10-CM

## 2021-11-07 DIAGNOSIS — Z79899 Other long term (current) drug therapy: Secondary | ICD-10-CM

## 2021-11-07 DIAGNOSIS — M25551 Pain in right hip: Secondary | ICD-10-CM | POA: Insufficient documentation

## 2021-11-07 DIAGNOSIS — M25552 Pain in left hip: Secondary | ICD-10-CM

## 2021-11-07 DIAGNOSIS — Z978 Presence of other specified devices: Secondary | ICD-10-CM | POA: Diagnosis not present

## 2021-11-07 DIAGNOSIS — F1190 Opioid use, unspecified, uncomplicated: Secondary | ICD-10-CM

## 2021-11-07 DIAGNOSIS — M533 Sacrococcygeal disorders, not elsewhere classified: Secondary | ICD-10-CM | POA: Insufficient documentation

## 2021-11-07 DIAGNOSIS — M5137 Other intervertebral disc degeneration, lumbosacral region: Secondary | ICD-10-CM | POA: Insufficient documentation

## 2021-11-07 DIAGNOSIS — Z451 Encounter for adjustment and management of infusion pump: Secondary | ICD-10-CM | POA: Diagnosis not present

## 2021-11-07 DIAGNOSIS — M5136 Other intervertebral disc degeneration, lumbar region: Secondary | ICD-10-CM

## 2021-11-07 HISTORY — DX: Other reaction to spinal and lumbar puncture: G97.1

## 2021-11-07 MED ORDER — CEFAZOLIN SODIUM 1 G IJ SOLR
INTRAMUSCULAR | Status: AC
  Filled 2021-11-07: qty 10

## 2021-11-07 MED ORDER — CEFAZOLIN SODIUM-DEXTROSE 1-4 GM/50ML-% IV SOLN
1.0000 g | Freq: Once | INTRAVENOUS | Status: AC
  Administered 2021-11-07: 1 g via INTRAVENOUS

## 2021-11-07 MED ORDER — OXYCODONE-ACETAMINOPHEN 5-325 MG PO TABS: 1.0000 | ORAL_TABLET | Freq: Three times a day (TID) | ORAL | 0 refills | Status: DC | PRN

## 2021-11-07 MED ORDER — CEPHALEXIN 500 MG PO CAPS: 500.0000 mg | ORAL_CAPSULE | Freq: Three times a day (TID) | ORAL | 0 refills | Status: AC

## 2021-11-07 MED ORDER — LACTATED RINGERS IV SOLN
1000.0000 mL | Freq: Once | INTRAVENOUS | Status: AC
  Administered 2021-11-07: 1000 mL via INTRAVENOUS

## 2021-11-07 MED ORDER — LIDOCAINE HCL (PF) 2 % IJ SOLN
INTRAMUSCULAR | Status: AC
  Filled 2021-11-07: qty 5

## 2021-11-07 MED FILL — Medication: INTRATHECAL | Qty: 1 | Status: AC

## 2021-11-07 NOTE — Patient Instructions (Signed)
Opioid Overdose ?Opioids are drugs that are often used to treat pain. Opioids include illegal drugs, such as heroin, as well as prescription pain medicines, such as codeine, morphine, hydrocodone, and fentanyl. ?An opioid overdose happens when you take too much of an opioid. An overdose may be intentional or accidental and can happen with any type of opioid. ?The effects of an overdose can be mild, dangerous, or even deadly. Opioid overdose is a medical emergency. ?What are the causes? ?This condition may be caused by: ?Taking too much of an opioid on purpose. ?Taking too much of an opioid by accident. ?Using two or more substances that contain opioids at the same time. ?Taking an opioid with a substance that affects your heart, breathing, or blood pressure. These include alcohol, tranquilizers, sleeping pills, illegal drugs, and some over-the-counter medicines. ?This condition may also happen due to an error made by: ?A health care provider who prescribes a medicine. ?The pharmacist who fills the prescription. ?What increases the risk? ?This condition is more likely in: ?Children. They may be attracted to colorful pills. Because of a child's small size, even a small amount of a medicine can be dangerous. ?Older people. They may be taking many different medicines. Older people may have difficulty reading labels or remembering when they last took their medicines. They may also be more sensitive to the effects of opioids. ?People with chronic medical conditions, especially heart, liver, kidney, or neurological diseases. ?People who take an opioid for a long period of time. ?People who take opioids and use illegal drugs, such as heroin, or other substances, such as alcohol. ?People who: ?Have a history of drug or alcohol abuse. ?Have certain mental health conditions. ?Have a history of previous drug overdoses. ?People who take opioids that are not prescribed for them. ?What are the signs or symptoms? ?Symptoms of this  condition depend on the type of opioid and the amount that was taken. Common symptoms include: ?Sleepiness or difficulty waking from sleep. ?Confusion. ?Slurred speech. ?Slowed breathing and a slow pulse (bradycardia). ?Nausea and vomiting. ?Abnormally small pupils. ?Signs and symptoms that require emergency treatment include: ?Cold, clammy, and pale skin. ?Blue lips and fingernails. ?Vomiting. ?Gurgling sounds in the throat. ?A pulse that is very slow or difficult to detect. ?Breathing that is very irregular, slow, noisy, or difficult to detect. ?Inability to respond to speech or be awakened from sleep (stupor). ?Seizures. ?How is this diagnosed? ?This condition is diagnosed based on your symptoms and medical history. It is important to tell your health care provider: ?About all of the opioids that you took. ?When you took the opioids. ?Whether you were drinking alcohol or using marijuana, cocaine, or other drugs. ?Your health care provider will do a physical exam. This exam may include: ?Checking and monitoring your heart rate and rhythm, breathing rate, temperature, and blood pressure. ?Measuring oxygen levels in your blood. ?Checking for abnormally small pupils. ?You may also have blood tests or urine tests. You may have X-rays if you are having severe breathing problems. ?How is this treated? ?This condition requires immediate medical treatment and hospitalization. Reversing the effects of the opioid is the first step in treatment. If you have a Narcan kit or naloxone, use it right away. Follow your health care provider's instructions. A friend or family member can also help you with this. ?The rest of your treatment will be given in the hospital intensive care (ICU). Treatment in the hospital may include: ?Giving salts and minerals (electrolytes) along with fluids through  an IV. Inserting a breathing tube (endotracheal tube) in your airway to help you breathe if you cannot breathe on your own or you are in  danger of not being able to breathe on your own. Giving oxygen through a small tube under your nose. Passing a tube through your nose and into your stomach (nasogastric tube, or NG tube) to empty your stomach. Giving medicines that: Increase your blood pressure. Relieve nausea and vomiting. Relieve abdominal pain and cramping. Reverse the effects of the opioid (naloxone). Monitoring your heart and oxygen levels. Ongoing counseling and mental health support if you intentionally overdosed or used an illegal drug. Follow these instructions at home:  Medicines Take over-the-counter and prescription medicines only as told by your health care provider. Always ask your health care provider about possible side effects and interactions of any new medicine that you start taking. Keep a list of all the medicines that you take, including over-the-counter medicines. Bring this list with you to all your medical visits. General instructions Drink enough fluid to keep your urine pale yellow. Keep all follow-up visits. This is important. How is this prevented? Read the drug inserts that come with your opioid pain medicines. Take medicines only as told by your health care provider. Do not take more medicine than you are told. Do not take medicines more frequently than you are told. Do not drink alcohol or take sedatives when taking opioids. Do not use illegal or recreational drugs, including cocaine, ecstasy, and marijuana. Do not take opioid medicines that are not prescribed for you. Store all medicines in safety containers that are out of the reach of children. Get help if you are struggling with: Alcohol or drug use. Depression or another mental health problem. Thoughts of hurting yourself or another person. Keep the phone number of your local poison control center near your phone or in your mobile phone. In the [place] the hotline of the National Poison Control Center is (800) 222-1222. If you were  prescribed naloxone, make sure you understand how to take it. Contact a health care provider if: You need help understanding how to take your pain medicines. You feel your medicines are too strong. You are concerned that your pain medicines are not working well for your pain. You develop new symptoms or side effects when you are taking medicines. Get help right away if: You or someone else is having symptoms of an opioid overdose. Get help even if you are not sure. You have thoughts about hurting yourself or others. You have: Chest pain. Difficulty breathing. A loss of consciousness. These symptoms may represent a serious problem that is an emergency. Do not wait to see if the symptoms will go away. Get medical help right away. Call your local emergency services (911 in the [place] Do not drive yourself to the hospital. If you ever feel like you may hurt yourself or others, or have thoughts about taking your own life, get help right away. You can go to your nearest emergency department or: Call your local emergency services (911 in the [place] Call a suicide crisis helpline, such as the National Suicide Prevention Lifeline at 1-800-273-8255 or 988 in the [place] This is open 24 hours a day in the [place] Text the Crisis Text Line at 741741 (in the [place] Summary Opioids are drugs that are often used to treat pain. Opioids include illegal drugs, such as heroin, as well as prescription pain medicines. An opioid overdose happens when you take too much of an   opioid. ?Overdoses can be intentional or accidental. ?Opioid overdose is very dangerous. It is a life-threatening emergency. ?If you or someone you know is experiencing an opioid overdose, get help right away. ?This information is not intended to replace advice given to you by your health care provider. Make sure you discuss any questions you have with your health care provider. ?Document Revised: 01/11/2021 Document Reviewed: 09/28/2020 ?Elsevier  Patient Education ? [place]. ? ?

## 2021-11-07 NOTE — Progress Notes (Signed)
Dr. Dossie Arbour aspirated 475 cc clear, yellow fluid from the area around the intrathecal pump. ?

## 2021-11-08 ENCOUNTER — Telehealth: Payer: Self-pay | Admitting: *Deleted

## 2021-11-08 NOTE — Telephone Encounter (Signed)
I spoke to Melinda [name] regarding pump issues. She states she had a headache that started around 5 pm yesterday. Went away when she laid down. Still has the pinching feeling in her sacral area that she discussed with Dr. Dossie Arbour yesterday. Has no headache now and is sitting up. States she has CT appt on 11/15/21.  ?

## 2021-11-08 NOTE — Telephone Encounter (Signed)
Post procedure call;  voicemail left.  

## 2021-11-09 ENCOUNTER — Encounter: Payer: Self-pay | Admitting: Pain Medicine

## 2021-11-09 ENCOUNTER — Other Ambulatory Visit (HOSPITAL_BASED_OUTPATIENT_CLINIC_OR_DEPARTMENT_OTHER): Payer: Medicare Other | Admitting: Pain Medicine

## 2021-11-09 DIAGNOSIS — M96843 Postprocedural seroma of a musculoskeletal structure following other procedure: Secondary | ICD-10-CM

## 2021-11-09 DIAGNOSIS — Z978 Presence of other specified devices: Secondary | ICD-10-CM

## 2021-11-09 DIAGNOSIS — T85620S Displacement of epidural and subdural infusion catheter, sequela: Secondary | ICD-10-CM

## 2021-11-09 DIAGNOSIS — T85615S Breakdown (mechanical) of other nervous system device, implant or graft, sequela: Secondary | ICD-10-CM

## 2021-11-09 DIAGNOSIS — T85610S Breakdown (mechanical) of epidural and subdural infusion catheter, sequela: Secondary | ICD-10-CM

## 2021-11-09 MED ORDER — ABDOMINAL BINDER/ELASTIC LARGE MISC: 0 refills | Status: DC

## 2021-11-09 NOTE — Progress Notes (Signed)
The patient continues to have considerable problems with the intrathecal pump in the recurrent abdominal wall seroma.  At this point, the pump is not believed to be working appropriately.  The same goes for the intrathecal catheter.  For this reason, we believe that it is necessary to remove the intrathecal pump and catheter.  Intraoperative steps also need to be taken to attempt eliminating the recurring seroma.  A recent pump and catheter test failed to show integrity of the system.  Today I am entering a referral to [place] [facility] specialist for the removal of the intrathecal pump and catheter. ?

## 2021-11-09 NOTE — Telephone Encounter (Signed)
After speaking with Dr. Dossie Arbour and after he viewed the attached photos and per his orders patient instructed (via phone) to: ? ?1) [place] [facility] ( per patient request) consult for explantation . Blanch Media given referral order 11/09/2021. ?2) abdominal binder ( script sent to [place]) ?3)  Patient to go to ED for fever, increased fluid collection, pain, any S/S infection or any concerns. ?4)  Continue PO antibiotics  ?5) call Monday 11/13/21 if you have not heard back from [place] regarding the referral. ?6) call [facility] concerns/ questions. ? ? ?

## 2021-11-14 NOTE — Telephone Encounter (Signed)
11/13/2021 I followed up with Blanch Media [employer] clerk) regarding her referral to [facility] for explantation consult. Blanch Media states patient did hear from [facility] and has an appointment scheduled. ?

## 2021-11-15 ENCOUNTER — Other Ambulatory Visit: Payer: Medicare Other

## 2021-11-15 ENCOUNTER — Other Ambulatory Visit: Payer: Self-pay | Admitting: Pain Medicine

## 2021-11-15 ENCOUNTER — Ambulatory Visit
Admission: RE | Admit: 2021-11-15 | Discharge: 2021-11-15 | Disposition: A | Discharge status: Home / Self Care | Payer: Medicare Other | Source: Ambulatory Visit | Attending: Pain Medicine | Admitting: Pain Medicine

## 2021-11-15 DIAGNOSIS — Z451 Encounter for adjustment and management of infusion pump: Secondary | ICD-10-CM | POA: Insufficient documentation

## 2021-11-15 DIAGNOSIS — G894 Chronic pain syndrome: Secondary | ICD-10-CM | POA: Insufficient documentation

## 2021-11-15 DIAGNOSIS — M5136 Other intervertebral disc degeneration, lumbar region: Secondary | ICD-10-CM | POA: Insufficient documentation

## 2021-11-15 DIAGNOSIS — M545 Low back pain, unspecified: Secondary | ICD-10-CM | POA: Diagnosis not present

## 2021-11-15 DIAGNOSIS — M5137 Other intervertebral disc degeneration, lumbosacral region: Secondary | ICD-10-CM | POA: Diagnosis not present

## 2021-11-15 DIAGNOSIS — M79605 Pain in left leg: Secondary | ICD-10-CM | POA: Insufficient documentation

## 2021-11-15 DIAGNOSIS — K5730 Diverticulosis of large intestine without perforation or abscess without bleeding: Secondary | ICD-10-CM | POA: Diagnosis not present

## 2021-11-15 DIAGNOSIS — G96198 Other disorders of meninges, not elsewhere classified: Secondary | ICD-10-CM | POA: Diagnosis not present

## 2021-11-15 DIAGNOSIS — G971 Other reaction to spinal and lumbar puncture: Secondary | ICD-10-CM | POA: Diagnosis not present

## 2021-11-15 DIAGNOSIS — M5441 Lumbago with sciatica, right side: Secondary | ICD-10-CM | POA: Diagnosis not present

## 2021-11-15 DIAGNOSIS — M5134 Other intervertebral disc degeneration, thoracic region: Secondary | ICD-10-CM | POA: Insufficient documentation

## 2021-11-15 DIAGNOSIS — M96843 Postprocedural seroma of a musculoskeletal structure following other procedure: Secondary | ICD-10-CM | POA: Diagnosis not present

## 2021-11-15 DIAGNOSIS — M961 Postlaminectomy syndrome, not elsewhere classified: Secondary | ICD-10-CM | POA: Diagnosis not present

## 2021-11-15 DIAGNOSIS — Z969 Presence of functional implant, unspecified: Secondary | ICD-10-CM | POA: Insufficient documentation

## 2021-11-15 DIAGNOSIS — G8929 Other chronic pain: Secondary | ICD-10-CM | POA: Insufficient documentation

## 2021-11-15 DIAGNOSIS — Z978 Presence of other specified devices: Secondary | ICD-10-CM | POA: Insufficient documentation

## 2021-11-15 DIAGNOSIS — M47816 Spondylosis without myelopathy or radiculopathy, lumbar region: Secondary | ICD-10-CM | POA: Insufficient documentation

## 2021-11-15 DIAGNOSIS — N261 Atrophy of kidney (terminal): Secondary | ICD-10-CM | POA: Diagnosis not present

## 2021-11-15 DIAGNOSIS — I700 Atherosclerosis of aorta: Secondary | ICD-10-CM | POA: Diagnosis not present

## 2021-11-15 DIAGNOSIS — M5416 Radiculopathy, lumbar region: Secondary | ICD-10-CM | POA: Diagnosis not present

## 2021-11-15 DIAGNOSIS — M546 Pain in thoracic spine: Secondary | ICD-10-CM | POA: Diagnosis not present

## 2021-11-20 ENCOUNTER — Encounter: Payer: Self-pay | Admitting: Pain Medicine

## 2021-11-20 DIAGNOSIS — T888XXD Other specified complications of surgical and medical care, not elsewhere classified, subsequent encounter: Secondary | ICD-10-CM | POA: Diagnosis not present

## 2021-11-20 DIAGNOSIS — Z9689 Presence of other specified functional implants: Secondary | ICD-10-CM | POA: Diagnosis not present

## 2021-11-21 ENCOUNTER — Encounter: Payer: Self-pay | Admitting: Pain Medicine

## 2021-11-22 ENCOUNTER — Encounter: Payer: Self-pay | Admitting: Pain Medicine

## 2021-11-27 ENCOUNTER — Encounter: Payer: Self-pay | Admitting: Pain Medicine

## 2021-12-07 ENCOUNTER — Encounter: Payer: Self-pay | Admitting: Pain Medicine

## 2021-12-07 ENCOUNTER — Ambulatory Visit: Payer: Medicare Other | Attending: Pain Medicine | Admitting: Pain Medicine

## 2021-12-07 VITALS — BP 137/89 | HR 67 | Temp 98.2°F | Ht 69.0 in | Wt 205.0 lb

## 2021-12-07 DIAGNOSIS — S301XXS Contusion of abdominal wall, sequela: Secondary | ICD-10-CM | POA: Diagnosis not present

## 2021-12-07 DIAGNOSIS — M961 Postlaminectomy syndrome, not elsewhere classified: Secondary | ICD-10-CM

## 2021-12-07 DIAGNOSIS — T85620S Displacement of epidural and subdural infusion catheter, sequela: Secondary | ICD-10-CM | POA: Diagnosis not present

## 2021-12-07 DIAGNOSIS — T85610S Breakdown (mechanical) of epidural and subdural infusion catheter, sequela: Secondary | ICD-10-CM

## 2021-12-07 DIAGNOSIS — M96843 Postprocedural seroma of a musculoskeletal structure following other procedure: Secondary | ICD-10-CM

## 2021-12-07 DIAGNOSIS — Z79891 Long term (current) use of opiate analgesic: Secondary | ICD-10-CM

## 2021-12-07 DIAGNOSIS — Z969 Presence of functional implant, unspecified: Secondary | ICD-10-CM

## 2021-12-07 DIAGNOSIS — Z978 Presence of other specified devices: Secondary | ICD-10-CM | POA: Diagnosis not present

## 2021-12-07 DIAGNOSIS — T888XXS Other specified complications of surgical and medical care, not elsewhere classified, sequela: Secondary | ICD-10-CM | POA: Diagnosis not present

## 2021-12-07 DIAGNOSIS — G8929 Other chronic pain: Secondary | ICD-10-CM | POA: Diagnosis not present

## 2021-12-07 DIAGNOSIS — E1141 Type 2 diabetes mellitus with diabetic mononeuropathy: Secondary | ICD-10-CM | POA: Diagnosis not present

## 2021-12-07 MED ORDER — LIDOCAINE HCL 2 % IJ SOLN
INTRAMUSCULAR | Status: AC
  Filled 2021-12-07: qty 20

## 2021-12-07 NOTE — Addendum Note (Signed)
Addended by: [name] A on: 12/07/2021 04:16 PM   Modules accepted: Orders

## 2021-12-07 NOTE — H&P (View-Only) (Signed)
PROVIDER NOTE: Information contained herein reflects review and annotations entered in association with encounter. Interpretation of such information and data should be left to medically-trained personnel. Information provided to patient can be located elsewhere in the medical record under "Patient Instructions". Document created using STT-dictation technology, any transcriptional errors that may result from process are unintentional.    Patient: Melinda [name]  Service Category: E/M  Provider: Gaspar Cola, MD  DOB: [birth year]  DOS: 12/07/2021  Specialty: Interventional Pain Management  MRN: [id number]  Setting: Ambulatory outpatient  PCP: Jearld Fenton, NP  Type: Established Patient    Referring Provider: Jearld Fenton, NP  Location: Office  Delivery: Face-to-face     HPI  Melinda [name], a 66 y.o. year old female, is here today because of her Abdominal wall seroma, sequela [S30.1XXS]. Melinda [name] primary complain today is Leg Pain (bilateral) Last encounter: My last encounter with her was on 11/07/2021. 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); Lumbar spondylosis; Lumbar spinal stenosis w/ neurogenic claudication; Abnormal MRI, lumbar spine (11/18/2019); Lumbar facet hypertrophy (Multilevel); Lumbar foraminal stenosis; Lumbar lateral recess stenosis; Chronic right-sided low back pain w/o sciatica from IT catheter anchor.; Chronic low back pain (Bilateral) w/o sciatica; DDD (degenerative disc disease), lumbosacral; Thoracic facet  hypertrophy/arthropathy (Multilevel) (Bilateral); Thoracic facet syndrome (Multilevel) (Bilateral); Malfunction of intrathecal infusion pump; Malposition of intrathecal infusion catheter; Pain and numbness of upper extremity (Bilateral); Cervicalgia; History of carpal tunnel surgery (Bilateral); Abdominal wall seroma; Recurrent right knee instability; 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 Pain Assessment: Severity of Chronic pain is reported as a 6 /10. Location: Leg Right, Left, Upper, Lower/ . Onset:  . Quality: Constant, Throbbing, Heaviness. Timing: Constant. Modifying factor(s): nothing. Vitals:  height is $RemoveB'5\' 9"'VWZqSOsN$  (1.753 m) and weight is 205 lb (93 kg). Her temporal temperature is 98.2 F (36.8 C). Her blood pressure is 137/89 and her pulse is 67. Her oxygen saturation is 99%.   Reason for encounter: Pre-operative heart & lung assessment. I have discussed with the patient the benefits, risks, side effects, alternatives, likelihood of achieving goals and potential problems associated with the planned procedure. The patient refers understanding. Pre-operative cardiopulmonary assessment shows: Respiratory system: CTA bilaterally, no wheezing, no crackles, normal respiratory effort. Cardiovascular system: regular rate and rhythm, no murmur.   The patient returns to the clinic today with reaccumulation of fluid in her right abdominal seroma in the area of the pump implant.  This pump has undergone several revisions for the purpose of reentering the device which has come loose and its flipping within the abdominal pocket.  The seroma has been drained in multiple locations and at this point  due to the constant reaccumulation of fluid and the increased risk of infection, the decision was made to remove the intrathecal pump.  This plan was shared with the patient who understood, agreed, and accepted.  Today she comes in again to drain the seroma and to  do the preoperative assessment prior to the removal of the pump and intrathecal catheter.  Procedure: Drainage of seroma.  Informed consent was obtained.  The area was prepped with antiseptic agent.  The skin over the pump was infiltrated with 1 cc of 2% lidocaine.  An 18-gauge needle was then introduced and using a three-way stopper, extension, and 3, 30 mL pumps, a total of 600 mL of a clear serous fluid was drained from the pump.  There was no redness, increased temperature, or fluid coloration suggesting infection.  The patient tolerated the entire procedure well.  I talked to pertained to 2 answer all the patient's questions regarding the pump removal.  She indicates being ready for it.  Pharmacotherapy Assessment  Analgesic: Oxycodone 5 mg, 1 tab PO q 8 hrs (15 mg/day of oxycodone) + Intrathecal PF-Fentanyl MME/day: 22.5 mg/day (oral).   Monitoring: [place] PMP: PDMP reviewed during this encounter.       Pharmacotherapy: No side-effects or adverse reactions reported. Compliance: No problems identified. Effectiveness: Clinically acceptable.  Concepcion Elk, RN  12/07/2021  2:52 PM  Sign when Signing Visit 600 cc clear, yellow fluid aspirated by Dr. Laurence Slate.    UDS:  Summary  Date Value Ref Range Status  10/23/2021 Note  Final    Comment:    ==================================================================== [facility] (MW) ==================================================================== Test                             Result       Flag       Units  Drug Present and Declared for Prescription Verification   Oxycodone                      978          EXPECTED   ng/mg creat   Oxymorphone                     240          EXPECTED   ng/mg creat   Noroxycodone                   3713         EXPECTED   ng/mg creat    Sources of oxycodone include scheduled prescription medications.    Oxymorphone and noroxycodone are expected metabolites of oxycodone.    Oxymorphone is also available as a scheduled prescription medication.  Drug Present not Declared for Prescription Verification   Carboxy-THC                    40           UNEXPECTED ng/mg creat    Carboxy-THC is a metabolite of tetrahydrocannabinol (THC). Source of    THC is most commonly herbal marijuana or marijuana-based products,    but THC is also present in a scheduled prescription medication.    Trace amounts of THC can be present in hemp and cannabidiol (CBD)    products. This test is not intended to distinguish between delta-9-    tetrahydrocannabinol, the predominant form of THC in most herbal or    marijuana-based products, and delta-8-tetrahydrocannabinol.    Fentanyl                       17           UNEXPECTED ng/mg creat  Norfentanyl                    190          UNEXPECTED ng/mg creat    Source of fentanyl is a scheduled prescription medication, including    IV, patch, and transmucosal formulations. Norfentanyl is an expected    metabolite of fentanyl.  ==================================================================== Test                      Result    Flag   Units      Ref Range   Creatinine              134              mg/dL      >=20 ==================================================================== Declared Medications:  The flagging and interpretation on this report are based on the  following declared medications.  Unexpected results may arise from  inaccuracies in the declared medications.   **Note: The testing scope of this panel includes these medications:   Oxycodone (Percocet)   **Note: The testing scope of this panel does not include the  following reported medications:   Acetaminophen  (Percocet)  Biotin  Bupropion (Wellbutrin)  Calcium  Chondroitin  Ezetimibe (Zetia)  Fludrocortisone (Florinef)  Glucosamine  Insulin  Magnesium  Midodrine (Proamatine)  Nitroglycerin (Nitrostat)  Omega-3 Fatty Acids  Paroxetine (Paxil)  Polyethylene Glycol (MiraLAX)  Pregabalin (Lyrica)  Rosuvastatin (Crestor)  Semaglutide (Ozempic)  Tizanidine (Zanaflex)  Vitamin B  Vitamin C  Vitamin D  Vitamin E  Zinc ==================================================================== For clinical consultation, please call [phone]. ====================================================================      ROS  Constitutional: Denies any fever or chills Gastrointestinal: No reported hemesis, hematochezia, vomiting, or acute GI distress Musculoskeletal: Denies any acute onset joint swelling, redness, loss of ROM, or weakness Neurological: No reported episodes of acute onset apraxia, aphasia, dysarthria, agnosia, amnesia, paralysis, loss of coordination, or loss of consciousness  Medication Review  AMBULATORY NON FORMULARY MEDICATION, Abdominal Binder/Elastic Large, Accu-Chek Guide Control, Accu-Chek Softclix Lancets, Aspirin, Biotin, Calcium-Magnesium-Zinc, Cinnamon, Fish Oil, Insulin Pen Needle, Magnesium, NONFORMULARY OR COMPOUNDED ITEM, PARoxetine, Semaglutide(0.25 or 0.5MG /DOS), Vitamin D, b complex vitamins, buPROPion, ezetimibe, fludrocortisone, glucosamine-chondroitin, glucose blood, midodrine, nitroGLYCERIN, oxyCODONE-acetaminophen, polyethylene glycol, pregabalin, rosuvastatin, tiZANidine, vitamin C, and vitamin E  History Review  Allergy: Melinda [name] has No Known Allergies. Drug: Melinda [name]  reports no history of drug use. Alcohol:  reports current alcohol use. Tobacco:  reports that she quit smoking about 14 years ago. Her smoking use included cigarettes. She has a 48.00 pack-year smoking history. She has never used smokeless tobacco. Social: Melinda [name]  reports that  she quit smoking about 14 years ago. Her smoking use included cigarettes. She has a 48.00 pack-year smoking history. She has never used smokeless tobacco. She reports current alcohol use. She reports that she does not use drugs. 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), Amputation of second toe, right, traumatic ([place]) (09/2017), Anginal pain ([place]), Anxiety, Bilateral carotid artery stenosis (10/04/2021), Blue toes, Bulging disc, CAD (coronary artery disease) (2009), Cardiac arrhythmia due to congenital heart disease (1992), Chicken pox, Chronic fatigue, Chronic kidney disease, Coronary arteritis, Degenerative disc disease, Depression, Diabetes mellitus without complication ([place]), Facet joint disease, Fibromyalgia, Heart disease, Hyperlipidemia, IBS (irritable bowel syndrome), MCL deficiency, knee, MRSA (methicillin resistant staph aureus) culture positive (2011), Neuropathy (04/18/2010), Orthostatic hypotension (01/2020), Peripheral neuropathy, Renal insufficiency, Restless leg syndrome, Sepsis (  [place]) (09/2013), Sleep apnea (08/17/2003), Spinal headache (11/07/2021), and Spinal stenosis. Surgical: Melinda [name]  has a past surgical history that includes Foot surgery (Right); Hand surgery (Left); Gallbladder surgery; Ablation (2007); Ablation; HEART STENT (2009); HAND SURGEY (Left); Foot surgery (Bilateral); Foot surgery (Right); Infusion pump implantation; Back surgery; Cholecystectomy (2003); Anterior cervical decomp/discectomy fusion (N/A, 07/28/2014); Carpal tunnel release (Right); Eye surgery (Bilateral, 2013); Amputation toe (Right, 02/01/2017); Irrigation and debridement foot (Right, 02/23/2017); Toe Surgery; Hammer toe surgery (Right, 10/17/2017); Intrathecal pump revision (N/A, 04/25/2018); Intrathecal pump revision (Right, 04/25/2018); Coronary angioplasty (2008); Pain pump revision (N/A, 08/15/2018); Intrathecal pump revision (N/A, 12/12/2018);  Intrathecal pump revision (N/A, 03/11/2020); Knee surgery (Right, 05/24/2020); Total knee arthroplasty (Right, 05/24/2020); Total knee revision (Right, 09/06/2020); Excision partial phalanx (Left, 09/28/2021); and Wound debridement (Left, 09/28/2021). Family: family history includes Alcohol abuse in her paternal grandfather; Arthritis in her father; Cancer in her mother; Dementia in her maternal grandmother; Diabetes in her father; Heart disease in her father and paternal grandfather; Lung cancer in her mother; Stroke in her paternal grandfather.  Laboratory Chemistry Profile   Renal Lab Results  Component Value Date   BUN 21 10/23/2021   CREATININE 1.29 (H) 10/23/2021   LABCREA 137 12/21/2020   BCR 16 10/23/2021   GFR 51.87 (L) 03/22/2020   GFRAA 65 12/21/2020   GFRNONAA 56 (L) 12/21/2020    Hepatic Lab Results  Component Value Date   AST 26 10/23/2021   ALT 25 06/22/2021   ALBUMIN 4.7 10/23/2021   ALKPHOS 103 10/23/2021   HCVAB NEGATIVE 05/18/2016   LIPASE 11 10/04/2013    Electrolytes Lab Results  Component Value Date   NA 139 10/23/2021   K 4.8 10/23/2021   CL 99 10/23/2021   CALCIUM 9.8 10/23/2021   MG 2.1 06/30/2013    Bone Lab Results  Component Value Date   VD25OH 34.99 03/12/2019    Inflammation (CRP: Acute Phase) (ESR: Chronic Phase) Lab Results  Component Value Date   CRP 1 05/06/2018   ESRSEDRATE 52 (H) 04/02/2020   LATICACIDVEN 0.9 04/01/2020         Note: Above Lab results reviewed.  Recent Imaging Review  CT L-SPINE NO CHARGE CLINICAL DATA:  Low back pain with symptoms persisting for greater than 6 weeks. Possible intrathecal pump/catheter disconnection.  EXAM: CT THORACIC AND LUMBAR SPINE WITHOUT CONTRAST  TECHNIQUE: Multidetector CT imaging of the thoracic and lumbar spine was performed without contrast. Multiplanar CT image reconstructions were also generated.  RADIATION DOSE REDUCTION: This exam was performed according to  the departmental dose-optimization program which includes automated exposure control, adjustment of the mA and/or kV according to patient size and/or use of iterative reconstruction technique.  COMPARISON:  None Available.  FINDINGS: There is an intrathecal catheter with tip terminating at T5. The catheter enters the spinal canal at the T11-12 inter spinous space.  CT THORACIC SPINE FINDINGS  Alignment: Normal.  Vertebrae: No acute fracture or focal pathologic process.  Paraspinal and other soft tissues: Negative.  Disc levels: No spinal canal stenosis. Intrathecal catheter terminates at the T5 level.  CT LUMBAR SPINE FINDINGS  Segmentation: 5 lumbar type vertebrae.  Alignment: Mild S shaped scoliosis.  Vertebrae: No acute fracture or focal pathologic process.  Paraspinal and other soft tissues: Calcific aortic atherosclerosis.  Disc levels:  L1-2: Disc bulge with endplate spurring and mild facet hypertrophy. Mild left foraminal narrowing.  L2-3: Left asymmetric disc bulge with endplate spurring and mild left foraminal stenosis.  L3-4: Moderate facet hypertrophy  with small disc bulge and mild spinal canal narrowing.  L4-5: Moderate facet hypertrophy with small right asymmetric disc bulge and bulky right-sided osteophyte. Mild right foraminal stenosis.  L5-S1: Small disc bulge without spinal canal or neural foraminal stenosis. Left facet hypertrophy.  IMPRESSION: 1. No acute fracture or static subluxation of the thoracic or lumbar spine. 2. Intrathecal catheter terminates at the T5 level. The catheter enters the spinal canal at the T11-12 inter spinous space. No discontinuity along this portion of the catheter. Please refer to concomitant report for CT abdomen pelvis for findings related to the catheter pump and proximal catheter tubing. 3. Mild foraminal stenosis at L1-2, L2-3, L3-4 and L4-5.  Aortic Atherosclerosis (ICD10-I70.0).  Electronically Signed    By: Ulyses Jarred M.D.   On: 11/16/2021 19:00 CT THORACIC SPINE WO CONTRAST CLINICAL DATA:  Low back pain with symptoms persisting for greater than 6 weeks. Possible intrathecal pump/catheter disconnection.  EXAM: CT THORACIC AND LUMBAR SPINE WITHOUT CONTRAST  TECHNIQUE: Multidetector CT imaging of the thoracic and lumbar spine was performed without contrast. Multiplanar CT image reconstructions were also generated.  RADIATION DOSE REDUCTION: This exam was performed according to the departmental dose-optimization program which includes automated exposure control, adjustment of the mA and/or kV according to patient size and/or use of iterative reconstruction technique.  COMPARISON:  None Available.  FINDINGS: There is an intrathecal catheter with tip terminating at T5. The catheter enters the spinal canal at the T11-12 inter spinous space.  CT THORACIC SPINE FINDINGS  Alignment: Normal.  Vertebrae: No acute fracture or focal pathologic process.  Paraspinal and other soft tissues: Negative.  Disc levels: No spinal canal stenosis. Intrathecal catheter terminates at the T5 level.  CT LUMBAR SPINE FINDINGS  Segmentation: 5 lumbar type vertebrae.  Alignment: Mild S shaped scoliosis.  Vertebrae: No acute fracture or focal pathologic process.  Paraspinal and other soft tissues: Calcific aortic atherosclerosis.  Disc levels:  L1-2: Disc bulge with endplate spurring and mild facet hypertrophy. Mild left foraminal narrowing.  L2-3: Left asymmetric disc bulge with endplate spurring and mild left foraminal stenosis.  L3-4: Moderate facet hypertrophy with small disc bulge and mild spinal canal narrowing.  L4-5: Moderate facet hypertrophy with small right asymmetric disc bulge and bulky right-sided osteophyte. Mild right foraminal stenosis.  L5-S1: Small disc bulge without spinal canal or neural foraminal stenosis. Left facet hypertrophy.  IMPRESSION: 1. No acute  fracture or static subluxation of the thoracic or lumbar spine. 2. Intrathecal catheter terminates at the T5 level. The catheter enters the spinal canal at the T11-12 inter spinous space. No discontinuity along this portion of the catheter. Please refer to concomitant report for CT abdomen pelvis for findings related to the catheter pump and proximal catheter tubing. 3. Mild foraminal stenosis at L1-2, L2-3, L3-4 and L4-5.  Aortic Atherosclerosis (ICD10-I70.0).  Electronically Signed   By: Ulyses Jarred M.D.   On: 11/16/2021 19:00 CT ABDOMEN PELVIS WO CONTRAST CLINICAL DATA:  Abdominal wall mass suspected. Recurrent abdominal wall seroma possible intrathecal pump catheter disconnection. Malfunctioning intrathecal pump.  Chronic pain syndrome. Presence of intrathecal pump. Presence of functional implant. Encounter for adjustment or management of infusion pump. Postprocedural seroma of a musculoskeletal structure following other procedure.  EXAM: CT ABDOMEN AND PELVIS WITHOUT CONTRAST  TECHNIQUE: Multidetector CT imaging of the abdomen and pelvis was performed following the standard protocol without IV contrast.  RADIATION DOSE REDUCTION: This exam was performed according to the departmental dose-optimization program which includes automated exposure control, adjustment  of the mA and/or kV according to patient size and/or use of iterative reconstruction technique.  COMPARISON:  CT 04/16/2019  FINDINGS: Lower chest: Hypoventilatory changes and scattered subsegmental atelectasis in the lung bases. No pleural effusion. There are coronary artery calcifications.  Hepatobiliary: No evidence of focal liver lesion. Post cholecystectomy with stable biliary dilatation from prior exam.  Pancreas: No ductal dilatation or inflammation.  Spleen: Normal in size without focal abnormality.  Adrenals/Urinary Tract: Normal adrenal glands. Bilateral extrarenal pelvis configuration of the  kidneys. No perinephric edema. Slight left renal parenchymal atrophy and scarring. No renal calculi. No evidence of focal renal lesion on this unenhanced exam. Partially distended but unremarkable urinary bladder.  Stomach/Bowel: Small hiatal hernia. The stomach is decompressed. Administered enteric contrast reaches the distal small bowel. No small bowel obstruction or inflammation. No small bowel wall thickening. The appendix is normal. Moderate volume of stool throughout the colon. Occasional colonic diverticula. No diverticulitis, colonic wall thickening, or pericolonic edema. The sigmoid colon is redundant.  Vascular/Lymphatic: Moderate aortic atherosclerosis. No aortic aneurysm. No abdominopelvic adenopathy.  Reproductive: Uterus and bilateral adnexa are unremarkable.  Other: Right lower quadrant subcutaneous infusion pump. Fluid collection surrounding the pump was present on remote prior exam, but has increased in size measuring 12.5 x 10.6 x 10.1 cm (TR by AP by CC). There is no definite internal complexity of the fluid. This causes mass effect on the anterior abdominal wall both anterior and posteriorly. Radiopaque catheter is seen within the fluid collection. The left lateral aspect of the catheter appears to connect to the infusion pump, series 2, image 51. However there is questionable discontinuity about the right lateral aspect of the catheter adjacent to the infusion pump, series 2, image 52, although this is not well assessed due to adjacent streak artifact. There is a thin non radiopaque soft tissue tract extending from the infusion pump posterolaterally in the abdominal wall extending to a radiopaque portion posterior to T12-L1. This tract previously contained radiopaque catheter. Please note that concurrent thoracic and lumbar spine CT performed well assessed the intrathecal portion of the catheter.  There is no intra-abdominal or intrapelvic fluid  collection.  Musculoskeletal: Thoracic and lumbar spine assessed on concurrent spine CT. Mild bilateral hip osteoarthritis. No suspicious bone lesion or acute osseous abnormalities of the pelvis.  IMPRESSION: 1. Right lower quadrant subcutaneous infusion pump with interval increase in size of fluid collection surrounding the pump, now measuring 12.5 x 10.6 x 10.1 cm. This causes mass effect on the anterior abdominal wall both anterior and posteriorly. Questionable discontinuity about the right aspect of the catheter within the fluid collection, although this area is not well assessed due to adjacent metallic streak artifact. 2. Thin non radiopaque soft tissue tract extending from the infusion pump posterolaterally in the abdominal wall extending to a radiopaque portion posterior to T12-L1. This tract previously contained radiopaque catheter. There is no discontinuity of this tract. The intrathecal portion of the catheter is better assessed on concurrent thoracic and lumbar spine CT, reported separately. 3. Moderate colonic stool burden. Mild colonic diverticulosis without diverticulitis. 4. Small hiatal hernia.  Aortic Atherosclerosis (ICD10-I70.0).  Electronically Signed   By: Keith Rake M.D.   On: 11/16/2021 09:58 Note: Reviewed        Physical Exam  General appearance: Well nourished, well developed, and well hydrated. In no apparent acute distress Mental status: Alert, oriented x 3 (person, place, & time)       Respiratory: No evidence of acute respiratory distress  Eyes: PERLA Vitals: BP 137/89 (BP Location: Right Arm, Patient Position: Sitting, Cuff Size: Normal)   Pulse 67   Temp 98.2 F (36.8 C) (Temporal)   Ht $R'5\' 9"'nW$  (1.753 m)   Wt 205 lb (93 kg)   LMP  (LMP Unknown)   SpO2 99%   BMI 30.27 kg/m  BMI: Estimated body mass index is 30.27 kg/m as calculated from the following:   Height as of this encounter: $RemoveBeforeD'5\' 9"'eOaPwGxNnigbVa$  (1.753 m).   Weight as of this encounter: 205  lb (93 kg). Ideal: Ideal body weight: 66.2 kg (145 lb 15.1 oz) Adjusted ideal body weight: 76.9 kg (169 lb 9.1 oz)  Assessment   Diagnosis Status  1. Abdominal wall seroma, sequela   2. Presence of intrathecal pump (Medtronic intrathecal programmable pump) (40 mL pump)   3. 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 Failed back surgical syndrome   5. Failed cervical surgery syndrome (ACDF)   6. Encounter for chronic pain management   7. Long term current use of opiate analgesic   8. Presence of functional implant (Medtronic programmable intrathecal pump) (Right abdominal area)   9. Type 2 diabetes mellitus with diabetic mononeuropathy, without long-term current use of insulin ([place])   10. Fluid collection at surgical site, sequela   11. Malfunction of intrathecal infusion pump, sequela   12. Malposition of intrathecal infusion catheter, sequela    Controlled Controlled Controlled   Updated Problems: Problem  Fluid Collection At Surgical Site    Plan of Care  Problem-specific:  No problem-specific Assessment & Plan notes found for this encounter.  Ms. Shaunie Boehm has a current medication list which includes the following long-term medication(s): bupropion, abdominal binder/elastic large, ezetimibe, nitroglycerin, oxycodone-acetaminophen, [START ON 12/25/2021] oxycodone-acetaminophen, [START ON 01/24/2022] oxycodone-acetaminophen, paroxetine, pregabalin, rosuvastatin, tizanidine, and oxycodone-acetaminophen.  Pharmacotherapy (Medications Ordered): No orders of the defined types were placed in this encounter.  Orders:  Orders Placed This Encounter  Procedures   INCISION AND DRAINAGE    Standing Status:   Standing    Number of Occurrences:   1   Remove drug implant device    Standing Status:   Future    Standing Expiration Date:   04/08/2022    Scheduling Instructions:     Please schedule in same day surgery. Call the  [employer] representative and schedule them to be available in the OR for procedure.   Skin prep    Standing Status:   Standing    Number of Occurrences:   1   PR DRAINAGE OF HEMATOMA/FLUID   Follow-up plan:   Return for Removal of intrathecal pump.     Interventional Therapies  Risk  Complexity Considerations:   Estimated body mass index is 30.72 kg/m as calculated from the following:   Height as of this encounter: $RemoveBeforeD'5\' 9"'[id number]$  (1.753 m).   Weight as of this encounter: 208 lb (94.3 kg). WNL   Planned  Pending:   Diagnostic bilateral lumbar facet block  Possible bilateral therapeutic lumbar facet RFA #1    Under consideration:   Possible left lumbar facet RFA  Possible right lumbar facet RFA  Diagnostic caudal ESI + diagnostic epidurogram  Possible Racz procedure  Diagnostic bilateral IA knee injection (Steroid) Diagnostic bilateral genicular NB  Possible bilateral genicular nerve RFA  Possible bilateral Hyalgan knee injection  Diagnostic cervical ESI  Diagnostic bilateral cervical facet block  Possible bilateral cervical facet RFA    Completed:   Diagnostic/therapeutic left L2-3  LESI x1  Diagnostic/therapeutic right lumbar facet block x3 (06/23/2019) (100/100/75/>75)  Diagnostic/therapeutic left lumbar facet block x1 (06/23/2019) (100/100/75/>75)    Therapeutic  Palliative (PRN) options:   Palliative/therapeutic intrathecal pump management (refills/programming adjustments)  Palliative left L2-3 LESI #2  Palliative right lumbar facet block #4  Diagnostic left lumbar facet block #2     Recent Visits Date Type Provider Dept  11/07/21 Procedure visit [name], MD [facility]  10/23/21 Office Visit [name], MD [facility]  09/26/21 Procedure visit [name], MD [facility]  Showing recent visits within past 90 days and meeting all other requirements Today's Visits Date Type Provider Dept  12/07/21 Procedure visit  [name], MD [facility]  Showing today's visits and meeting all other requirements Future Appointments Date Type Provider Dept  02/12/22 Appointment [name], MD [facility]  Showing future appointments within next 90 days and meeting all other requirements  I discussed the assessment and treatment plan with the patient. The patient was provided an opportunity to ask questions and all were answered. The patient agreed with the plan and demonstrated an understanding of the instructions.  Patient advised to call back or seek an in-person evaluation if the symptoms or condition worsens.  Duration of encounter: 30 minutes.  Note by: Gaspar Cola, MD Date: 12/07/2021; Time: 4:16 PM

## 2021-12-07 NOTE — Progress Notes (Signed)
600 cc clear, yellow fluid aspirated by Dr. Delano Metz.

## 2021-12-07 NOTE — Progress Notes (Signed)
PROVIDER NOTE: Information contained herein reflects review and annotations entered in association with encounter. Interpretation of such information and data should be left to medically-trained personnel. Information provided to patient can be located elsewhere in the medical record under "Patient Instructions". Document created using STT-dictation technology, any transcriptional errors that may result from process are unintentional.    Patient: Melinda [name]  Service Category: E/M  Provider: Gaspar Cola, MD  DOB: January 21, 1956  DOS: 12/07/2021  Specialty: Interventional Pain Management  MRN: [id number]  Setting: Ambulatory outpatient  PCP: Jearld Fenton, NP  Type: Established Patient    Referring Provider: Jearld Fenton, NP  Location: Office  Delivery: Face-to-face     HPI  Melinda [name], a 66 y.o. year old female, is here today because of her Abdominal wall seroma, sequela [S30.1XXS]. Melinda [name] primary complain today is Leg Pain (bilateral) Last encounter: My last encounter with her was on 11/07/2021. 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); Lumbar spondylosis; Lumbar spinal stenosis w/ neurogenic claudication; Abnormal MRI, lumbar spine (11/18/2019); Lumbar facet hypertrophy (Multilevel); Lumbar foraminal stenosis; Lumbar lateral recess stenosis; Chronic right-sided low back pain w/o sciatica from IT catheter anchor.; Chronic low back pain (Bilateral) w/o sciatica; DDD (degenerative disc disease), lumbosacral; Thoracic facet  hypertrophy/arthropathy (Multilevel) (Bilateral); Thoracic facet syndrome (Multilevel) (Bilateral); Malfunction of intrathecal infusion pump; Malposition of intrathecal infusion catheter; Pain and numbness of upper extremity (Bilateral); Cervicalgia; History of carpal tunnel surgery (Bilateral); Abdominal wall seroma; Recurrent right knee instability; 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 Pain Assessment: Severity of Chronic pain is reported as a 6 /10. Location: Leg Right, Left, Upper, Lower/ . Onset:  . Quality: Constant, Throbbing, Heaviness. Timing: Constant. Modifying factor(s): nothing. Vitals:  height is $RemoveB'5\' 9"'hBsVrbPh$  (1.753 m) and weight is 205 lb (93 kg). Her temporal temperature is 98.2 F (36.8 C). Her blood pressure is 137/89 and her pulse is 67. Her oxygen saturation is 99%.   Reason for encounter: Pre-operative heart & lung assessment. I have discussed with the patient the benefits, risks, side effects, alternatives, likelihood of achieving goals and potential problems associated with the planned procedure. The patient refers understanding. Pre-operative cardiopulmonary assessment shows: Respiratory system: CTA bilaterally, no wheezing, no crackles, normal respiratory effort. Cardiovascular system: regular rate and rhythm, no murmur.   The patient returns to the clinic today with reaccumulation of fluid in her right abdominal seroma in the area of the pump implant.  This pump has undergone several revisions for the purpose of reentering the device which has come loose and its flipping within the abdominal pocket.  The seroma has been drained in multiple locations and at this point  due to the constant reaccumulation of fluid and the increased risk of infection, the decision was made to remove the intrathecal pump.  This plan was shared with the patient who understood, agreed, and accepted.  Today she comes in again to drain the seroma and to  do the preoperative assessment prior to the removal of the pump and intrathecal catheter.  Procedure: Drainage of seroma.  Informed consent was obtained.  The area was prepped with antiseptic agent.  The skin over the pump was infiltrated with 1 cc of 2% lidocaine.  An 18-gauge needle was then introduced and using a three-way stopper, extension, and 3, 30 mL pumps, a total of 600 mL of a clear serous fluid was drained from the pump.  There was no redness, increased temperature, or fluid coloration suggesting infection.  The patient tolerated the entire procedure well.  I talked to pertained to 2 answer all the patient's questions regarding the pump removal.  She indicates being ready for it.  Pharmacotherapy Assessment  Analgesic: Oxycodone 5 mg, 1 tab PO q 8 hrs (15 mg/day of oxycodone) + Intrathecal PF-Fentanyl MME/day: 22.5 mg/day (oral).   Monitoring: [place] PMP: PDMP reviewed during this encounter.       Pharmacotherapy: No side-effects or adverse reactions reported. Compliance: No problems identified. Effectiveness: Clinically acceptable.  Concepcion Elk, RN  12/07/2021  2:52 PM  Sign when Signing Visit 600 cc clear, yellow fluid aspirated by Dr. Laurence Slate.    UDS:  Summary  Date Value Ref Range Status  10/23/2021 Note  Final    Comment:    ==================================================================== [facility] (MW) ==================================================================== Test                             Result       Flag       Units  Drug Present and Declared for Prescription Verification   Oxycodone                      978          EXPECTED   ng/mg creat   Oxymorphone                     240          EXPECTED   ng/mg creat   Noroxycodone                   3713         EXPECTED   ng/mg creat    Sources of oxycodone include scheduled prescription medications.    Oxymorphone and noroxycodone are expected metabolites of oxycodone.    Oxymorphone is also available as a scheduled prescription medication.  Drug Present not Declared for Prescription Verification   Carboxy-THC                    40           UNEXPECTED ng/mg creat    Carboxy-THC is a metabolite of tetrahydrocannabinol (THC). Source of    THC is most commonly herbal marijuana or marijuana-based products,    but THC is also present in a scheduled prescription medication.    Trace amounts of THC can be present in hemp and cannabidiol (CBD)    products. This test is not intended to distinguish between delta-9-    tetrahydrocannabinol, the predominant form of THC in most herbal or    marijuana-based products, and delta-8-tetrahydrocannabinol.    Fentanyl                       17           UNEXPECTED ng/mg creat  Norfentanyl                    190          UNEXPECTED ng/mg creat    Source of fentanyl is a scheduled prescription medication, including    IV, patch, and transmucosal formulations. Norfentanyl is an expected    metabolite of fentanyl.  ==================================================================== Test                      Result    Flag   Units      Ref Range   Creatinine              134              mg/dL      >=20 ==================================================================== Declared Medications:  The flagging and interpretation on this report are based on the  following declared medications.  Unexpected results may arise from  inaccuracies in the declared medications.   **Note: The testing scope of this panel includes these medications:   Oxycodone (Percocet)   **Note: The testing scope of this panel does not include the  following reported medications:   Acetaminophen  (Percocet)  Biotin  Bupropion (Wellbutrin)  Calcium  Chondroitin  Ezetimibe (Zetia)  Fludrocortisone (Florinef)  Glucosamine  Insulin  Magnesium  Midodrine (Proamatine)  Nitroglycerin (Nitrostat)  Omega-3 Fatty Acids  Paroxetine (Paxil)  Polyethylene Glycol (MiraLAX)  Pregabalin (Lyrica)  Rosuvastatin (Crestor)  Semaglutide (Ozempic)  Tizanidine (Zanaflex)  Vitamin B  Vitamin C  Vitamin D  Vitamin E  Zinc ==================================================================== For clinical consultation, please call [phone]. ====================================================================      ROS  Constitutional: Denies any fever or chills Gastrointestinal: No reported hemesis, hematochezia, vomiting, or acute GI distress Musculoskeletal: Denies any acute onset joint swelling, redness, loss of ROM, or weakness Neurological: No reported episodes of acute onset apraxia, aphasia, dysarthria, agnosia, amnesia, paralysis, loss of coordination, or loss of consciousness  Medication Review  AMBULATORY NON FORMULARY MEDICATION, Abdominal Binder/Elastic Large, Accu-Chek Guide Control, Accu-Chek Softclix Lancets, Aspirin, Biotin, Calcium-Magnesium-Zinc, Cinnamon, Fish Oil, Insulin Pen Needle, Magnesium, NONFORMULARY OR COMPOUNDED ITEM, PARoxetine, Semaglutide(0.25 or 0.5MG /DOS), Vitamin D, b complex vitamins, buPROPion, ezetimibe, fludrocortisone, glucosamine-chondroitin, glucose blood, midodrine, nitroGLYCERIN, oxyCODONE-acetaminophen, polyethylene glycol, pregabalin, rosuvastatin, tiZANidine, vitamin C, and vitamin E  History Review  Allergy: Melinda [name] has No Known Allergies. Drug: Melinda [name]  reports no history of drug use. Alcohol:  reports current alcohol use. Tobacco:  reports that she quit smoking about 14 years ago. Her smoking use included cigarettes. She has a 48.00 pack-year smoking history. She has never used smokeless tobacco. Social: Melinda [name]  reports that  she quit smoking about 14 years ago. Her smoking use included cigarettes. She has a 48.00 pack-year smoking history. She has never used smokeless tobacco. She reports current alcohol use. She reports that she does not use drugs. 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 Surgical: Melinda [name]  has a past surgical history that includes Foot surgery (Right); Hand surgery (Left); Gallbladder surgery; Ablation (2007); Ablation; HEART STENT (2009); HAND SURGEY (Left); Foot surgery (Bilateral); Foot surgery (Right); Infusion pump implantation; Back surgery; Cholecystectomy (2003); Anterior cervical decomp/discectomy fusion (N/A, 07/28/2014); Carpal tunnel release (Right); Eye surgery (Bilateral, 2013); Amputation toe (Right, 02/01/2017); Irrigation and debridement foot (Right, 02/23/2017); Toe Surgery; Hammer toe surgery (Right, 10/17/2017); Intrathecal pump revision (N/A, 04/25/2018); Intrathecal pump revision (Right, 04/25/2018); Coronary angioplasty (2008); Pain pump revision (N/A, 08/15/2018); Intrathecal pump revision (N/A, 12/12/2018);  Intrathecal pump revision (N/A, 03/11/2020); Knee surgery (Right, 05/24/2020); Total knee arthroplasty (Right, 05/24/2020); Total knee revision (Right, 09/06/2020); Excision partial phalanx (Left, 09/28/2021); and Wound debridement (Left, 09/28/2021). Family: family history includes Alcohol abuse in her paternal grandfather; Arthritis in her father; Cancer in her mother; Dementia in her maternal grandmother; Diabetes in her father; Heart disease in her father and paternal grandfather; Lung cancer in her mother; Stroke in her paternal grandfather.  Laboratory Chemistry Profile   Renal Lab Results  Component Value Date   BUN 21 10/23/2021   CREATININE 1.29 (H) 10/23/2021   LABCREA 137 12/21/2020   BCR 16 10/23/2021   GFR 51.87 (L) 03/22/2020   GFRAA 65 12/21/2020   GFRNONAA 56 (L) 12/21/2020    Hepatic Lab Results  Component Value Date   AST 26 10/23/2021   ALT 25 06/22/2021   ALBUMIN 4.7 10/23/2021   ALKPHOS 103 10/23/2021   HCVAB NEGATIVE 05/18/2016   LIPASE 11 10/04/2013    Electrolytes Lab Results  Component Value Date   NA 139 10/23/2021   K 4.8 10/23/2021   CL 99 10/23/2021   CALCIUM 9.8 10/23/2021   MG 2.1 06/30/2013    Bone Lab Results  Component Value Date   VD25OH 34.99 03/12/2019    Inflammation (CRP: Acute Phase) (ESR: Chronic Phase) Lab Results  Component Value Date   CRP 1 05/06/2018   ESRSEDRATE 52 (H) 04/02/2020   LATICACIDVEN 0.9 04/01/2020         Note: Above Lab results reviewed.  Recent Imaging Review  CT L-SPINE NO CHARGE CLINICAL DATA:  Low back pain with symptoms persisting for greater than 6 weeks. Possible intrathecal pump/catheter disconnection.  EXAM: CT THORACIC AND LUMBAR SPINE WITHOUT CONTRAST  TECHNIQUE: Multidetector CT imaging of the thoracic and lumbar spine was performed without contrast. Multiplanar CT image reconstructions were also generated.  RADIATION DOSE REDUCTION: This exam was performed according to  the departmental dose-optimization program which includes automated exposure control, adjustment of the mA and/or kV according to patient size and/or use of iterative reconstruction technique.  COMPARISON:  None Available.  FINDINGS: There is an intrathecal catheter with tip terminating at T5. The catheter enters the spinal canal at the T11-12 inter spinous space.  CT THORACIC SPINE FINDINGS  Alignment: Normal.  Vertebrae: No acute fracture or focal pathologic process.  Paraspinal and other soft tissues: Negative.  Disc levels: No spinal canal stenosis. Intrathecal catheter terminates at the T5 level.  CT LUMBAR SPINE FINDINGS  Segmentation: 5 lumbar type vertebrae.  Alignment: Mild S shaped scoliosis.  Vertebrae: No acute fracture or focal pathologic process.  Paraspinal and other soft tissues: Calcific aortic atherosclerosis.  Disc levels:  L1-2: Disc bulge with endplate spurring and mild facet hypertrophy. Mild left foraminal narrowing.  L2-3: Left asymmetric disc bulge with endplate spurring and mild left foraminal stenosis.  L3-4: Moderate facet hypertrophy  with small disc bulge and mild spinal canal narrowing.  L4-5: Moderate facet hypertrophy with small right asymmetric disc bulge and bulky right-sided osteophyte. Mild right foraminal stenosis.  L5-S1: Small disc bulge without spinal canal or neural foraminal stenosis. Left facet hypertrophy.  IMPRESSION: 1. No acute fracture or static subluxation of the thoracic or lumbar spine. 2. Intrathecal catheter terminates at the T5 level. The catheter enters the spinal canal at the T11-12 inter spinous space. No discontinuity along this portion of the catheter. Please refer to concomitant report for CT abdomen pelvis for findings related to the catheter pump and proximal catheter tubing. 3. Mild foraminal stenosis at L1-2, L2-3, L3-4 and L4-5.  Aortic Atherosclerosis (ICD10-I70.0).  Electronically Signed    By: Ulyses Jarred M.D.   On: 11/16/2021 19:00 CT THORACIC SPINE WO CONTRAST CLINICAL DATA:  Low back pain with symptoms persisting for greater than 6 weeks. Possible intrathecal pump/catheter disconnection.  EXAM: CT THORACIC AND LUMBAR SPINE WITHOUT CONTRAST  TECHNIQUE: Multidetector CT imaging of the thoracic and lumbar spine was performed without contrast. Multiplanar CT image reconstructions were also generated.  RADIATION DOSE REDUCTION: This exam was performed according to the departmental dose-optimization program which includes automated exposure control, adjustment of the mA and/or kV according to patient size and/or use of iterative reconstruction technique.  COMPARISON:  None Available.  FINDINGS: There is an intrathecal catheter with tip terminating at T5. The catheter enters the spinal canal at the T11-12 inter spinous space.  CT THORACIC SPINE FINDINGS  Alignment: Normal.  Vertebrae: No acute fracture or focal pathologic process.  Paraspinal and other soft tissues: Negative.  Disc levels: No spinal canal stenosis. Intrathecal catheter terminates at the T5 level.  CT LUMBAR SPINE FINDINGS  Segmentation: 5 lumbar type vertebrae.  Alignment: Mild S shaped scoliosis.  Vertebrae: No acute fracture or focal pathologic process.  Paraspinal and other soft tissues: Calcific aortic atherosclerosis.  Disc levels:  L1-2: Disc bulge with endplate spurring and mild facet hypertrophy. Mild left foraminal narrowing.  L2-3: Left asymmetric disc bulge with endplate spurring and mild left foraminal stenosis.  L3-4: Moderate facet hypertrophy with small disc bulge and mild spinal canal narrowing.  L4-5: Moderate facet hypertrophy with small right asymmetric disc bulge and bulky right-sided osteophyte. Mild right foraminal stenosis.  L5-S1: Small disc bulge without spinal canal or neural foraminal stenosis. Left facet hypertrophy.  IMPRESSION: 1. No acute  fracture or static subluxation of the thoracic or lumbar spine. 2. Intrathecal catheter terminates at the T5 level. The catheter enters the spinal canal at the T11-12 inter spinous space. No discontinuity along this portion of the catheter. Please refer to concomitant report for CT abdomen pelvis for findings related to the catheter pump and proximal catheter tubing. 3. Mild foraminal stenosis at L1-2, L2-3, L3-4 and L4-5.  Aortic Atherosclerosis (ICD10-I70.0).  Electronically Signed   By: Ulyses Jarred M.D.   On: 11/16/2021 19:00 CT ABDOMEN PELVIS WO CONTRAST CLINICAL DATA:  Abdominal wall mass suspected. Recurrent abdominal wall seroma possible intrathecal pump catheter disconnection. Malfunctioning intrathecal pump.  Chronic pain syndrome. Presence of intrathecal pump. Presence of functional implant. Encounter for adjustment or management of infusion pump. Postprocedural seroma of a musculoskeletal structure following other procedure.  EXAM: CT ABDOMEN AND PELVIS WITHOUT CONTRAST  TECHNIQUE: Multidetector CT imaging of the abdomen and pelvis was performed following the standard protocol without IV contrast.  RADIATION DOSE REDUCTION: This exam was performed according to the departmental dose-optimization program which includes automated exposure control, adjustment  of the mA and/or kV according to patient size and/or use of iterative reconstruction technique.  COMPARISON:  CT 04/16/2019  FINDINGS: Lower chest: Hypoventilatory changes and scattered subsegmental atelectasis in the lung bases. No pleural effusion. There are coronary artery calcifications.  Hepatobiliary: No evidence of focal liver lesion. Post cholecystectomy with stable biliary dilatation from prior exam.  Pancreas: No ductal dilatation or inflammation.  Spleen: Normal in size without focal abnormality.  Adrenals/Urinary Tract: Normal adrenal glands. Bilateral extrarenal pelvis configuration of the  kidneys. No perinephric edema. Slight left renal parenchymal atrophy and scarring. No renal calculi. No evidence of focal renal lesion on this unenhanced exam. Partially distended but unremarkable urinary bladder.  Stomach/Bowel: Small hiatal hernia. The stomach is decompressed. Administered enteric contrast reaches the distal small bowel. No small bowel obstruction or inflammation. No small bowel wall thickening. The appendix is normal. Moderate volume of stool throughout the colon. Occasional colonic diverticula. No diverticulitis, colonic wall thickening, or pericolonic edema. The sigmoid colon is redundant.  Vascular/Lymphatic: Moderate aortic atherosclerosis. No aortic aneurysm. No abdominopelvic adenopathy.  Reproductive: Uterus and bilateral adnexa are unremarkable.  Other: Right lower quadrant subcutaneous infusion pump. Fluid collection surrounding the pump was present on remote prior exam, but has increased in size measuring 12.5 x 10.6 x 10.1 cm (TR by AP by CC). There is no definite internal complexity of the fluid. This causes mass effect on the anterior abdominal wall both anterior and posteriorly. Radiopaque catheter is seen within the fluid collection. The left lateral aspect of the catheter appears to connect to the infusion pump, series 2, image 51. However there is questionable discontinuity about the right lateral aspect of the catheter adjacent to the infusion pump, series 2, image 52, although this is not well assessed due to adjacent streak artifact. There is a thin non radiopaque soft tissue tract extending from the infusion pump posterolaterally in the abdominal wall extending to a radiopaque portion posterior to T12-L1. This tract previously contained radiopaque catheter. Please note that concurrent thoracic and lumbar spine CT performed well assessed the intrathecal portion of the catheter.  There is no intra-abdominal or intrapelvic fluid  collection.  Musculoskeletal: Thoracic and lumbar spine assessed on concurrent spine CT. Mild bilateral hip osteoarthritis. No suspicious bone lesion or acute osseous abnormalities of the pelvis.  IMPRESSION: 1. Right lower quadrant subcutaneous infusion pump with interval increase in size of fluid collection surrounding the pump, now measuring 12.5 x 10.6 x 10.1 cm. This causes mass effect on the anterior abdominal wall both anterior and posteriorly. Questionable discontinuity about the right aspect of the catheter within the fluid collection, although this area is not well assessed due to adjacent metallic streak artifact. 2. Thin non radiopaque soft tissue tract extending from the infusion pump posterolaterally in the abdominal wall extending to a radiopaque portion posterior to T12-L1. This tract previously contained radiopaque catheter. There is no discontinuity of this tract. The intrathecal portion of the catheter is better assessed on concurrent thoracic and lumbar spine CT, reported separately. 3. Moderate colonic stool burden. Mild colonic diverticulosis without diverticulitis. 4. Small hiatal hernia.  Aortic Atherosclerosis (ICD10-I70.0).  Electronically Signed   By: Keith Rake M.D.   On: 11/16/2021 09:58 Note: Reviewed        Physical Exam  General appearance: Well nourished, well developed, and well hydrated. In no apparent acute distress Mental status: Alert, oriented x 3 (person, place, & time)       Respiratory: No evidence of acute respiratory distress  Eyes: PERLA Vitals: BP 137/89 (BP Location: Right Arm, Patient Position: Sitting, Cuff Size: Normal)   Pulse 67   Temp 98.2 F (36.8 C) (Temporal)   Ht $R'5\' 9"'vF$  (1.753 m)   Wt 205 lb (93 kg)   LMP  (LMP Unknown)   SpO2 99%   BMI 30.27 kg/m  BMI: Estimated body mass index is 30.27 kg/m as calculated from the following:   Height as of this encounter: $RemoveBeforeD'5\' 9"'cREGAYhbuSAIoV$  (1.753 m).   Weight as of this encounter: 205  lb (93 kg). Ideal: Ideal body weight: 66.2 kg (145 lb 15.1 oz) Adjusted ideal body weight: 76.9 kg (169 lb 9.1 oz)  Assessment   Diagnosis Status  1. Abdominal wall seroma, sequela   2. Presence of intrathecal pump (Medtronic intrathecal programmable pump) (40 mL pump)   3. 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 Failed back surgical syndrome   5. Failed cervical surgery syndrome (ACDF)   6. Encounter for chronic pain management   7. Long term current use of opiate analgesic   8. Presence of functional implant (Medtronic programmable intrathecal pump) (Right abdominal area)   9. Type 2 diabetes mellitus with diabetic mononeuropathy, without long-term current use of insulin ([place])   10. Fluid collection at surgical site, sequela   11. Malfunction of intrathecal infusion pump, sequela   12. Malposition of intrathecal infusion catheter, sequela    Controlled Controlled Controlled   Updated Problems: Problem  Fluid Collection At Surgical Site    Plan of Care  Problem-specific:  No problem-specific Assessment & Plan notes found for this encounter.  Melinda [name] has a current medication list which includes the following long-term medication(s): bupropion, abdominal binder/elastic large, ezetimibe, nitroglycerin, oxycodone-acetaminophen, [START ON 12/25/2021] oxycodone-acetaminophen, [START ON 01/24/2022] oxycodone-acetaminophen, paroxetine, pregabalin, rosuvastatin, tizanidine, and oxycodone-acetaminophen.  Pharmacotherapy (Medications Ordered): No orders of the defined types were placed in this encounter.  Orders:  Orders Placed This Encounter  Procedures   INCISION AND DRAINAGE    Standing Status:   Standing    Number of Occurrences:   1   Remove drug implant device    Standing Status:   Future    Standing Expiration Date:   04/08/2022    Scheduling Instructions:     Please schedule in same day surgery. Call the  [employer] representative and schedule them to be available in the OR for procedure.   Skin prep    Standing Status:   Standing    Number of Occurrences:   1   PR DRAINAGE OF HEMATOMA/FLUID   Follow-up plan:   Return for Removal of intrathecal pump.     Interventional Therapies  Risk  Complexity Considerations:   Estimated body mass index is 30.72 kg/m as calculated from the following:   Height as of this encounter: $RemoveBeforeD'5\' 9"'[id number]$  (1.753 m).   Weight as of this encounter: 208 lb (94.3 kg). WNL   Planned  Pending:   Diagnostic bilateral lumbar facet block  Possible bilateral therapeutic lumbar facet RFA #1    Under consideration:   Possible left lumbar facet RFA  Possible right lumbar facet RFA  Diagnostic caudal ESI + diagnostic epidurogram  Possible Racz procedure  Diagnostic bilateral IA knee injection (Steroid) Diagnostic bilateral genicular NB  Possible bilateral genicular nerve RFA  Possible bilateral Hyalgan knee injection  Diagnostic cervical ESI  Diagnostic bilateral cervical facet block  Possible bilateral cervical facet RFA    Completed:   Diagnostic/therapeutic left L2-3  LESI x1  Diagnostic/therapeutic right lumbar facet block x3 (06/23/2019) (100/100/75/>75)  Diagnostic/therapeutic left lumbar facet block x1 (06/23/2019) (100/100/75/>75)    Therapeutic  Palliative (PRN) options:   Palliative/therapeutic intrathecal pump management (refills/programming adjustments)  Palliative left L2-3 LESI #2  Palliative right lumbar facet block #4  Diagnostic left lumbar facet block #2     Recent Visits Date Type Provider Dept  11/07/21 Procedure visit [name], MD [facility]  10/23/21 Office Visit [name], MD [facility]  09/26/21 Procedure visit [name], MD [facility]  Showing recent visits within past 90 days and meeting all other requirements Today's Visits Date Type Provider Dept  12/07/21 Procedure visit  [name], MD [facility]  Showing today's visits and meeting all other requirements Future Appointments Date Type Provider Dept  02/12/22 Appointment [name], MD [facility]  Showing future appointments within next 90 days and meeting all other requirements  I discussed the assessment and treatment plan with the patient. The patient was provided an opportunity to ask questions and all were answered. The patient agreed with the plan and demonstrated an understanding of the instructions.  Patient advised to call back or seek an in-person evaluation if the symptoms or condition worsens.  Duration of encounter: 30 minutes.  Note by: Gaspar Cola, MD Date: 12/07/2021; Time: 4:16 PM

## 2021-12-08 ENCOUNTER — Encounter: Payer: Self-pay | Admitting: Internal Medicine

## 2021-12-08 ENCOUNTER — Ambulatory Visit: Payer: Medicare Other | Admitting: Internal Medicine

## 2021-12-08 ENCOUNTER — Encounter: Payer: Self-pay | Admitting: Pain Medicine

## 2021-12-08 ENCOUNTER — Telehealth: Payer: Self-pay | Admitting: *Deleted

## 2021-12-08 ENCOUNTER — Other Ambulatory Visit: Payer: Self-pay | Admitting: Internal Medicine

## 2021-12-08 ENCOUNTER — Encounter
Admission: RE | Admit: 2021-12-08 | Discharge: 2021-12-08 | Disposition: A | Discharge status: Home / Self Care | Payer: Medicare Other | Source: Ambulatory Visit | Attending: Pain Medicine | Admitting: Pain Medicine

## 2021-12-08 DIAGNOSIS — Z01818 Encounter for other preprocedural examination: Secondary | ICD-10-CM

## 2021-12-08 DIAGNOSIS — I6523 Occlusion and stenosis of bilateral carotid arteries: Secondary | ICD-10-CM

## 2021-12-08 DIAGNOSIS — I2510 Atherosclerotic heart disease of native coronary artery without angina pectoris: Secondary | ICD-10-CM

## 2021-12-08 HISTORY — DX: Pre-excitation syndrome: I45.6

## 2021-12-08 NOTE — Telephone Encounter (Signed)
Request for pre-operative cardiac clearance Received: Today Karen Kitchens, NP  P Cv Div Preop Callback Request for pre-operative cardiac clearance:     1. What type of surgery is being performed?  INTRATHECAL PUMP REMOVAL   2. When is this surgery scheduled?  12/14/2021     3. Type of clearance being requested (medical, pharmacy, both).  BOTH     4. Are there any medications that need to be held prior to surgery?  ASA   5. Practice name and name of physician performing surgery?  Performing surgeon: Dr. [name], MD  Requesting clearance: Honor Loh, FNP-C       6. Anesthesia type (none, local, MAC, general)?  GENERAL   7. What is the office phone and fax number?    [phone] (phone)   ATTENTION: Unable to create telephone message as per your standard workflow. Directed by [facility] providers to send requests for cardiac clearance to this pool for appropriate distribution to provider covering pre-operative clearances.   Honor Loh, MSN, APRN, FNP-C, CEN  [facility]  Peri-operative Services Nurse Practitioner  Phone: [phone]  12/08/21 4:21 PM

## 2021-12-08 NOTE — Telephone Encounter (Signed)
Requested medication (s) are due for refill today - yes  Requested medication (s) are on the active medication list -yes  Future visit scheduled -no  Last refill: 09/14/21 4.70m  Notes to clinic: Request for continuing Rx- please update Sig  Requested Prescriptions  Pending Prescriptions Disp Refills   OZEMPIC, 0.25 OR 0.5 MG/DOSE, 2 MG/1.5ML SOPN [Pharmacy Med Name: OZEMPIC 0.25-0.5 MG/DOSE PEN]      Sig: INJECT 0.5 MG INTO THE SKIN ONCE A WEEK. FOR FIRST 4 WEEKS. THEN INCREASE DOSE TO 0.'5MG'$  WEEKLY     Endocrinology:  Diabetes - GLP-1 Receptor Agonists - semaglutide Failed - 12/08/2021 11:37 AM      Failed - Cr in normal range and within 360 days    Creat  Date Value Ref Range Status  06/22/2021 1.04 0.50 - 1.05 mg/dL Final   Creatinine, Ser  Date Value Ref Range Status  10/23/2021 1.29 (H) 0.57 - 1.00 mg/dL Final   Creatinine,U  Date Value Ref Range Status  03/12/2019 131.0 mg/dL Final   Creatinine, Urine  Date Value Ref Range Status  12/21/2020 137 20 - 275 mg/dL Final         Passed - HBA1C in normal range and within 180 days    Hgb A1c MFr Bld  Date Value Ref Range Status  06/22/2021 5.6 <5.7 % of total Hgb Final    Comment:    For the purpose of screening for the presence of diabetes: . <5.7%       Consistent with the absence of diabetes 5.7-6.4%    Consistent with increased risk for diabetes             (prediabetes) > or =6.5%  Consistent with diabetes . This assay result is consistent with a decreased risk of diabetes. . Currently, no consensus exists regarding use of hemoglobin A1c for diagnosis of diabetes in children. . According to American Diabetes Association (ADA) guidelines, hemoglobin A1c <7.0% represents optimal control in non-pregnant diabetic patients. Different metrics may apply to specific patient populations.  Standards of Medical Care in Diabetes(ADA). .Renella Cunas- Valid encounter within last 6 months    Recent Outpatient  Visits           2 months ago Preop general physical exam   [place]  [facility]   5 months ago Type 2 diabetes mellitus with diabetic mononeuropathy, without long-term current use of insulin ([place]   [facility]   6 months ago Stuffy and runny nose   [facility] [place] [facility]   11 months ago Type 2 diabetes mellitus with diabetic mononeuropathy, without long-term current use of insulin ([place]   [facility]       Future Appointments             In 1 month Skains, M[place]ana Farr MD [place] [facility]               Requested Prescriptions  Pending Prescriptions Disp Refills   OZEMPIC, 0.25 OR 0.5 MG/DOSE, 2 MG/1.5ML SOPN [Pharmacy Med Name: OZEMPIC 0.25-0.5 MG/DOSE PEN]      Sig: INJECT 0.5 MG INTO THE SKIN ONCE A WEEK. FOR FIRST 4 WEEKS. THEN INCREASE DOSE TO 0.'5MG'$  WEEKLY  Endocrinology:  Diabetes - GLP-1 Receptor Agonists - semaglutide Failed - 12/08/2021 11:37 AM      Failed - Cr in normal range and within 360 days    Creat  Date Value Ref Range Status  06/22/2021 1.04 0.50 - 1.05 mg/dL Final   Creatinine, Ser  Date Value Ref Range Status  10/23/2021 1.29 (H) 0.57 - 1.00 mg/dL Final   Creatinine,U  Date Value Ref Range Status  03/12/2019 131.0 mg/dL Final   Creatinine, Urine  Date Value Ref Range Status  12/21/2020 137 20 - 275 mg/dL Final         Passed - HBA1C in normal range and within 180 days    Hgb A1c MFr Bld  Date Value Ref Range Status  06/22/2021 5.6 <5.7 % of total Hgb Final    Comment:    For the purpose of screening for the presence of diabetes: . <5.7%       Consistent with the absence of diabetes 5.7-6.4%    Consistent with increased risk for diabetes             (prediabetes) > or =6.5%  Consistent with diabetes . This  assay result is consistent with a decreased risk of diabetes. . Currently, no consensus exists regarding use of hemoglobin A1c for diagnosis of diabetes in children. . According to American Diabetes Association (ADA) guidelines, hemoglobin A1c <7.0% represents optimal control in non-pregnant diabetic patients. Different metrics may apply to specific patient populations.  Standards of Medical Care in Diabetes(ADA). Renella Cunas - Valid encounter within last 6 months    Recent Outpatient Visits           2 months ago Preop general physical exam   [place], Dani Gobble, [place]   3 months ago [facility] [place], [place], NP   5 months ago Type 2 diabetes mellitus with diabetic mononeuropathy, without long-term current use of insulin [facility])   [facility], Coralie Keens, NP   6 months ago Stuffy and runny nose   [facility] [place], [place], NP   11 months ago Type 2 diabetes mellitus with diabetic mononeuropathy, without long-term current use of insulin [facility])   [facility], Coralie Keens, NP       Future Appointments             In 1 month Skains, Thana Farr, MD [place], [facility]

## 2021-12-08 NOTE — Patient Instructions (Signed)
Your procedure is scheduled on:12-14-21 Thursday Report to the Registration Desk on the 1st floor of the [place].Then proceed to the 2nd floor Surgery Desk To find out your arrival time, please call [phone] between 1PM - 3PM on:12-13-21 Wednesday If your arrival time is 6:00 am, do not arrive prior to that time as the [place] entrance doors do not open until 6:00 am.  REMEMBER: Instructions that are not followed completely may result in serious medical risk, up to and including death; or upon the discretion of your surgeon and anesthesiologist your surgery may need to be rescheduled.  Do not eat food after midnight the night before surgery.  No gum chewing, lozengers or hard candies.  You may however, drink Water up to 2 hours before you are scheduled to arrive for your surgery. Do not drink anything within 2 hours of your scheduled arrival time  Marmet WITH A SIP OF WATER: -buPROPion (WELLBUTRIN XL)  -fludrocortisone (FLORINEF)  -midodrine (PROAMATINE) -oxyCODONE-acetaminophen (PERCOCET) -PARoxetine (PAXIL) -pregabalin (LYRICA) -tiZANidine (ZANAFLEX)  Last dose of 81 mg Aspirin was on 12-07-21  One week prior to surgery: Stop Anti-inflammatories (NSAIDS) such as Advil, Aleve, Ibuprofen, Motrin, Naproxen, Naprosyn and Aspirin based products such as Excedrin, Goodys Powder, BC Powder.You may however, take Tylenol if needed for pain up until the day of surgery. Stop ANY OVER THE COUNTER supplements/vitamins until after surgery (Last dose of all vitamins was on 12-07-21) You may however, continue to take Tylenol if needed for pain up until the day of surgery.  No Alcohol for 24 hours before or after surgery.  No Smoking including e-cigarettes for 24 hours prior to surgery.  No chewable tobacco products for at least 6 hours prior to surgery.  No nicotine patches on the day of surgery.  Do not use any "recreational" drugs for at least a  week prior to your surgery.  Please be advised that the combination of cocaine and anesthesia may have negative outcomes, up to and including death. If you test positive for cocaine, your surgery will be cancelled.  On the morning of surgery brush your teeth with toothpaste and water, you may rinse your mouth with mouthwash if you wish. Do not swallow any toothpaste or mouthwash.  Use CHG Soap as directed on instruction sheet.  Do not wear jewelry, make-up, hairpins, clips or nail polish.  Do not wear lotions, powders, or perfumes.   Do not shave body from the neck down 48 hours prior to surgery just in case you cut yourself which could leave a site for infection.  Also, freshly shaved skin may become irritated if using the CHG soap.  Contact lenses, hearing aids and dentures may not be worn into surgery.  Do not bring valuables to the hospital. [facility] is not responsible for any missing/lost belongings or valuables.   Bring your C-PAP to the hospital with you  Notify your doctor if there is any change in your medical condition (cold, fever, infection).  Wear comfortable clothing (specific to your surgery type) to the hospital.  After surgery, you can help prevent lung complications by doing breathing exercises.  Take deep breaths and cough every 1-2 hours. Your doctor may order a device called an Incentive Spirometer to help you take deep breaths. When coughing or sneezing, hold a pillow firmly against your incision with both hands. This is called "splinting." Doing this helps protect your incision. It also decreases belly discomfort.  If you are  being admitted to the hospital overnight, leave your suitcase in the car. After surgery it may be brought to your room.  If you are being discharged the day of surgery, you will not be allowed to drive home. You will need a responsible adult (18 years or older) to drive you home and stay with you that night.   If you are taking  public transportation, you will need to have a responsible adult (18 years or older) with you. Please confirm with your physician that it is acceptable to use public transportation.   Please call the [place] Dept. at [phone] if you have any questions about these instructions.  Surgery Visitation Policy:  Patients undergoing a surgery or procedure may have two family members or support persons with them as long as the person is not COVID-19 positive or experiencing its symptoms.

## 2021-12-10 ENCOUNTER — Other Ambulatory Visit: Payer: Self-pay | Admitting: Internal Medicine

## 2021-12-11 ENCOUNTER — Encounter
Admission: RE | Admit: 2021-12-11 | Discharge: 2021-12-11 | Disposition: A | Discharge status: Home / Self Care | Payer: Medicare Other | Source: Ambulatory Visit | Attending: Pain Medicine | Admitting: Pain Medicine

## 2021-12-11 DIAGNOSIS — Z01812 Encounter for preprocedural laboratory examination: Secondary | ICD-10-CM | POA: Diagnosis not present

## 2021-12-11 DIAGNOSIS — I2510 Atherosclerotic heart disease of native coronary artery without angina pectoris: Secondary | ICD-10-CM

## 2021-12-11 DIAGNOSIS — I6523 Occlusion and stenosis of bilateral carotid arteries: Secondary | ICD-10-CM | POA: Diagnosis not present

## 2021-12-11 DIAGNOSIS — Z01818 Encounter for other preprocedural examination: Secondary | ICD-10-CM

## 2021-12-11 LAB — CBC
HCT: 38.1 % (ref 36.0–46.0)
Hemoglobin: 12.3 g/dL (ref 12.0–15.0)
MCH: 28 pg (ref 26.0–34.0)
MCHC: 32.3 g/dL (ref 30.0–36.0)
MCV: 86.8 fL (ref 80.0–100.0)
Platelets: 184 10*3/uL (ref 150–400)
RBC: 4.39 MIL/uL (ref 3.87–5.11)
RDW: 11.9 % (ref 11.5–15.5)
WBC: 5.7 10*3/uL (ref 4.0–10.5)
nRBC: 0 % (ref 0.0–0.2)

## 2021-12-11 NOTE — Telephone Encounter (Signed)
   Primary Cardiologist: Candee Furbish, MD  Chart reviewed as part of pre-operative protocol coverage. Given past medical history and time since last visit, based on ACC/AHA guidelines, Aamira Bischoff would be at acceptable risk for the planned procedure without further cardiovascular testing.   Spoke with patient to determine if she is having any cardiac concerns since appointment with Richardson Dopp, PA on 10/04/21. Patient states she has been holding aspirin because she has done so in the past without problem. She will resume immediately after procedure. She denies any cardiac concerns. She was advised that if she develops new symptoms prior to surgery to contact our office to arrange a follow-up appointment.  He verbalized understanding.  I will route this recommendation to the requesting party via Epic fax function and remove from pre-op pool.  Please call with questions.  Emmaline Life, NP-C    12/11/2021, 3:53 PM [place] [address], [address] Office [phone] Fax [phone]

## 2021-12-11 NOTE — Telephone Encounter (Signed)
Requested Prescriptions  Pending Prescriptions Disp Refills  . ezetimibe (ZETIA) 10 MG tablet [Pharmacy Med Name: EZETIMIBE 10 MG TABLET] 90 tablet 1    Sig: TAKE 1 TABLET BY MOUTH EVERY DAY     Cardiovascular:  Antilipid - Sterol Transport Inhibitors Failed - 12/10/2021  1:18 AM      Failed - Lipid Panel in normal range within the last 12 months    Cholesterol  Date Value Ref Range Status  06/22/2021 98 <200 mg/dL Final   LDL Cholesterol (Calc)  Date Value Ref Range Status  06/22/2021 39 mg/dL (calc) Final    Comment:    Reference range: <100 . Desirable range <100 mg/dL for primary prevention;   <70 mg/dL for patients with CHD or diabetic patients  with > or = 2 CHD risk factors. Marland Kitchen LDL-C is now calculated using the Martin-Hopkins  calculation, which is a validated novel method providing  better accuracy than the Friedewald equation in the  estimation of LDL-C.  Cresenciano Genre et al. Annamaria Helling. 3976;734(19): 2061-2068  (http://education.[employer].com/faq/FAQ164)    Direct LDL  Date Value Ref Range Status  11/04/2017 63.0 mg/dL Final    Comment:    Optimal:  <100 mg/dLNear or Above Optimal:  100-129 mg/dLBorderline High:  130-159 mg/dLHigh:  160-189 mg/dLVery High:  >190 mg/dL   HDL  Date Value Ref Range Status  06/22/2021 39 (L) > OR = 50 mg/dL Final   Triglycerides  Date Value Ref Range Status  06/22/2021 114 <150 mg/dL Final         Passed - AST in normal range and within 360 days    AST  Date Value Ref Range Status  10/23/2021 26 0 - 40 IU/L Final         Passed - ALT in normal range and within 360 days    ALT  Date Value Ref Range Status  06/22/2021 25 6 - 29 U/L Final         Passed - Patient is not pregnant      Passed - Valid encounter within last 12 months    Recent Outpatient Visits          2 months ago Preop general physical exam   [place], [place]   3 months ago [facility] [place],  [place], NP   5 months ago Type 2 diabetes mellitus with diabetic mononeuropathy, without long-term current use of insulin [facility])   [facility] [place], Coralie Keens, NP   6 months ago Stuffy and runny nose   [facility] [place], [place], NP   11 months ago Type 2 diabetes mellitus with diabetic mononeuropathy, without long-term current use of insulin ([place])   [facility] [place], Coralie Keens, NP      Future Appointments            In 1 month Skains, Thana Farr, MD [place], [facility]

## 2021-12-12 ENCOUNTER — Encounter: Payer: Self-pay | Admitting: Pain Medicine

## 2021-12-12 NOTE — Progress Notes (Signed)
Perioperative Services  Pre-Admission/Anesthesia Testing Clinical Review  Date: 12/13/21  Patient Demographics:  Name: Melinda [name] DOB:   [birth year] MRN:   [id number]  Planned Surgical Procedure(s):    Case: [id number] Date/Time: 12/14/21 1211   Procedure: INTRATHECAL PUMP REMOVAL (Right)   Anesthesia type: General   Pre-op diagnosis: BACK PAIN   Location: [place] [facility]   Surgeons: [name], MD   NOTE: Available PAT nursing documentation and vital signs have been reviewed. Clinical nursing staff has updated patient's PMH/PSHx, current medication list, and drug allergies/intolerances to ensure comprehensive history available to assist in medical decision making as it pertains to the aforementioned surgical procedure and anticipated anesthetic course. Extensive review of available clinical information performed. [place] PMH and PSHx updated with any diagnoses/procedures that  may have been inadvertently omitted during her intake with the pre-admission testing department's nursing staff.  Clinical Discussion:  Melinda [name] is a 66 y.o. female who is submitted for pre-surgical anesthesia review and clearance prior to her undergoing the above procedure. Patient is a Former Smoker (48 pack years; quit 07/2007). Pertinent PMH includes: CAD, WPW, orthostatic hypotension, BILATERAL carotid artery stenosis, aortic atherosclerosis, HLD, T2DM, OSAH (requires nocturnal PAP therapy), CKD-III, hiatal hernia, fibromyalgia, failed cervical fusion spinal stenosis, facet joint disease, OA, chronic fatigue, multiple amputations RIGHT foot, anxiety, depression, RLS, peripheral neuropathy, chronic pain syndrome (on COT).   Patient is followed by cardiology Marlou Porch, MD). She was last seen in the cardiology clinic on 10/04/2021; notes reviewed.  At the time of her clinic visit, patient reported a several month history of issues with her blood pressure being low.   During her last visit, patient had been started on proamatine and fludrocortisone.  Since beginning those therapies, dosages have been adjusted and patient reported that blood pressure had stabilized.  She is now having issues with her blood pressure again and is also experiencing associated exertional chest discomfort.  She denies any chest pain, PND, orthopnea, palpitations, significant peripheral edema, vertiginous symptoms, or presyncope/syncope.  Patient with a past medical history significant for cardiovascular diagnoses.  Of note, full records regarding cardiovascular history not available for review at time of consult.  Information gathered from patient and notes from local cardiologist.  Patient with a history of Wolff-Parkinson-White syndrome.  She has undergone ablation procedures in the past that have all been unsuccessful.  Patient now has rare episodes of WPW that have historically been self-limiting.  Patient underwent diagnostic left heart catheterization in 2009 revealing obstructive CAD within the mid LAD.  Degree of stenosis unknown.  Patient did undergo PCI and placement of a single 15 x 30 mm Promus DES to the mid LAD.  Patient underwent diagnostic left heart catheterization on 02/27/2011 revealing normal ventricular systolic function with an EF of 60%.  There was no significant CAD noted.  There were was mild luminal irregularities at the ostium of the diagonal, which was jailed by the previously placed stent.  Vessel with TIMI-3 flow.  Last TTE was performed on 04/22/2019 revealing a normal left ventricular systolic function with an EF of 60-65%.  No regional wall motion abnormalities.  Right ventricular systolic function normal.  There was trivial tricuspid valve regurgitation.  There was no evidence of a significant transvalvular gradient to suggest stenosis.  RVSP mildly elevated at 34.8 mmHg.  Myocardial perfusion imaging study performed on 10/12/2021 revealing a normal left  ventricular systolic function with a hyperdynamic LVEF of 69%.  End-diastolic cavity size  normal.  Small apical perfusion defect with stress and at rest, however this was felt to be related to apical thinning. There was no evidence of significant stress-induced myocardial ischemia or arrhythmia; no scintigraphic evidence of scar.  Study determined to be low risk.  Patient with a history of BILATERAL carotid artery stenosis.  Last carotid duplex study was performed on 10/20/2021 revealing 16-10% RICA and 9-60% LICA stenosis.  BILATERAL vertebral arteries demonstrated antegrade flow.  Normal flow hemodynamics seen in the BILATERAL subclavian arteries.  Blood pressure reasonably controlled in the office at 120/48.  Again, patient on proamatine and fludrocortisone for documented hypotension.  Patient is on a statin + omega-3 fatty acid for her HLD diagnosis and further ASCVD prevention.  T2DM well-controlled on currently prescribed regimen; last HgbA1c was 5.6% when checked on 06/22/2021.  Patient does have an OSAH diagnosis, and is reported to be compliant with her prescribed nocturnal PAP therapy.  No changes were made to her medication regimen.  Patient to follow-up with outpatient cardiology in 3 months or sooner if needed.  Melinda [name] is scheduled for an elective RIGHT INTRATHECAL PUMP REMOVAL  on 12/14/2021 with Dr. [name], MD.  Given patient's past medical history significant for cardiovascular diagnoses, presurgical cardiac clearance was sought by the PAT team. Per cardiology, "based ACC/AHA guidelines, the patient's past medical history, and the amount of time since her last clinic visit, this patient would be at an overall ACCEPTABLE risk for the planned procedure without further cardiovascular testing or intervention at this time".  In review of the patient's medication reconciliation, it is noted the patient is on daily antiplatelet therapy.  She has been instructed on recommendations  from her cardiologist for holding her daily low-dose ASA for 7 days prior to her procedure with plans to restart as soon as postoperatively respectively minimized by primary attending surgeon.  The patient is aware that her last dose of ASA should be on 12/07/2021.  Patient denies previous perioperative complications with anesthesia in the past. In review of the available records, it is noted that patient underwent a general anesthetic course here at [facility] (ASA III) in 08/2021 without documented complications.      12/07/2021    1:59 PM 11/07/2021   12:39 PM 10/23/2021    1:52 PM  Vitals with BMI  Height '5\' 9"'$  '5\' 9"'$  '5\' 9"'$   Weight 205 lbs 205 lbs 208 lbs  BMI 30.26 45.40 98.1  Systolic 191 478 295  Diastolic 89 53 64  Pulse 67 78 71    Providers/Specialists:   NOTE: Primary physician provider listed below. Patient may have been seen by APP or partner within same practice.   PROVIDER ROLE / SPECIALTY LAST Avel Peace, MD Pain Management (Surgeon) 12/07/2021  Jearld Fenton, NP Primary Care Provider 09/04/2021  Candee Furbish, MD Cardiology 10/04/2021   Allergies:  Patient has no known allergies.  Current Home Medications:   No current facility-administered medications for this encounter.    ACCU-CHEK GUIDE test strip   Accu-Chek Softclix Lancets lancets   AMBULATORY NON FORMULARY MEDICATION   Ascorbic Acid (VITAMIN C) 1000 MG tablet   ASPIRIN 81 PO   Biotin 10000 MCG TABS   Blood Glucose Calibration (ACCU-CHEK GUIDE CONTROL) LIQD   buPROPion (WELLBUTRIN XL) 150 MG 24 hr tablet   CALCIUM-MAGNESIUM-ZINC PO   CINNAMON PO   Coenzyme Q10 (CO Q 10 PO)   Elastic Bandages & Supports (ABDOMINAL BINDER/ELASTIC LARGE) MISC  ezetimibe (ZETIA) 10 MG tablet   fludrocortisone (FLORINEF) 0.1 MG tablet   glucosamine-chondroitin 500-400 MG tablet   Insulin Pen Needle 31G X 5 MM MISC   Magnesium 400 MG CAPS   midodrine (PROAMATINE) 5 MG  tablet   nitroGLYCERIN (NITROSTAT) 0.4 MG SL tablet   NONFORMULARY OR COMPOUNDED ITEM   Omega-3 Fatty Acids (FISH OIL) 1200 MG CAPS   oxyCODONE-acetaminophen (PERCOCET) 5-325 MG tablet   oxyCODONE-acetaminophen (PERCOCET) 5-325 MG tablet   [START ON 12/25/2021] oxyCODONE-acetaminophen (PERCOCET) 5-325 MG tablet   [START ON 01/24/2022] oxyCODONE-acetaminophen (PERCOCET) 5-325 MG tablet   OZEMPIC, 0.25 OR 0.5 MG/DOSE, 2 MG/3ML SOPN   PARoxetine (PAXIL) 20 MG tablet   polyethylene glycol (MIRALAX / GLYCOLAX) packet   pregabalin (LYRICA) 150 MG capsule   rosuvastatin (CRESTOR) 20 MG tablet   TART CHERRY PO   tiZANidine (ZANAFLEX) 4 MG tablet   vitamin E 180 MG (400 UNITS) capsule   History:   Past Medical History:  Diagnosis Date   Abdominal wall seroma    Amputation of great toe, right, traumatic ([place]) 05/30/2010   Amputation of second toe, right, traumatic ([place]) 09/2017   Anginal pain (HCC)    Anxiety    Aortic atherosclerosis ([place])    Bilateral carotid artery stenosis 12/24/2014   a.) Doppler 12/24/2014: 60-79% BILATERAL ICA. b.) Doppler 06/28/2015: 40-59% BILATERAL ICA. c.) Dopplers 04/23/2019, 04/22/2020, 10/20/2021: 19-41% RICA and 7-40% LICA.   Blue toes    2nd toe on right foot, will get appt.   Bulging disc    CAD (coronary artery disease) 2009   a.) PCI with placement of a 15 x 30 mm Promus DES to mLAD. b.) LHC 02/27/2011: EF 60%; no sig CAD; mild lum irreg at ostium of diagnoal (jailed by stent).   Chicken pox    Chronic fatigue    Chronic, continuous use of opioids    CKD (chronic kidney disease), stage III (HCC)    Degenerative disc disease    Depression    Diverticulosis    Facet joint disease    Failed cervical fusion    Fibromyalgia    Hiatal hernia    Hyperlipidemia    IBS (irritable bowel syndrome)    MCL deficiency, knee    MRSA (methicillin resistant staph aureus) culture positive 2011   GREAT TOE RIGHT FOOT   Neuropathy 04/18/2010   Orthostatic  hypotension 01/2020   a.) multiple pre-syncopal episodes; takes proamatine   OSA on CPAP 08/17/2003   Osteoarthritis    a.) back, neck, hands, knees   Restless leg syndrome    Sepsis ([place]) 09/2013   Spinal headache 11/07/2021   Spinal stenosis    T2DM (type 2 diabetes mellitus) ([place])    Wolff-Parkinson-White (WPW) syndrome 1992   a.) s/p ablations; unsuccessful. Now has rare episodes   Past Surgical History:  Procedure Laterality Date   AMPUTATION TOE Right 02/01/2017   Procedure: AMPUTATION TOE-RIGHT 2ND MPJ;  Surgeon: Albertine Patricia, DPM;  Location: [facility];  Service: Podiatry;  Laterality: Right;   ANTERIOR CERVICAL DECOMP/DISCECTOMY FUSION N/A 07/28/2014   Procedure: ANTERIOR CERVICAL DECOMPRESSION/DISCECTOMY FUSION CERVICAL 3-4,4-5,5-6 LEVELS WITH INSTRUMENTATION AND ALLOGRAFT;  Surgeon: Sinclair Ship, MD;  Location: [facility];  Service: Orthopedics;  Laterality: N/A;  Anterior cervical decompression fusion, cervical 3-4, cervical 4-5, cervical 5-6 with instrumentation and allograft   BACK SURGERY     X 3 1979, 1994, 1995   BLEPHAROPLASTY Bilateral 2013   CARDIAC ELECTROPHYSIOLOGY STUDY AND ABLATION N/A 1992  CARPAL TUNNEL RELEASE Right    CHOLECYSTECTOMY  2003   CORONARY ANGIOPLASTY WITH STENT PLACEMENT N/A 2009   Procedure: CORONARY ANGIOPLASTY WITH STENT PLACEMENT   ENDOMETRIAL ABLATION  2007   EXCISION PARTIAL PHALANX Left 09/28/2021   Procedure: EXCISION PARTIAL PHALANX - FIRST;  Surgeon: Rosetta Posner, DPM;  Location: [facility];  Service: Podiatry;  Laterality: Left;   GALLBLADDER SURGERY     HAMMER TOE SURGERY Right 10/17/2017   Procedure: HAMMER TOE CORRECTION-4TH TOE;  Surgeon: Recardo Evangelist, DPM;  Location: [facility];  Service: Podiatry;  Laterality: Right;  LMA- WITH LOCAL Diabetic - diet controlled   HAND SURGERY Left    INFUSION PUMP IMPLANTATION     X2 with morphine and baclofen   INTRATHECAL PUMP REVISION N/A 04/25/2018    Procedure: Intrathecal pump replacement;  Surgeon: Odette Fraction, MD;  Location: [facility] OR;  Service: Neurosurgery;  Laterality: N/A;  right   INTRATHECAL PUMP REVISION Right 04/25/2018   INTRATHECAL PUMP REVISION N/A 12/12/2018   Procedure: Intrathecal pump revision with exploration of pocket;  Surgeon: Odette Fraction, MD;  Location: [facility] OR;  Service: Neurosurgery;  Laterality: N/A;  Intrathecal pump revision with exploration of pocket   INTRATHECAL PUMP REVISION N/A 03/11/2020   Procedure: INTRATHECAL PUMP REVISION;  Surgeon: Delano Metz, MD;  Location: [facility];  Service: Neurosurgery;  Laterality: N/A;   IRRIGATION AND DEBRIDEMENT FOOT Right 02/23/2017   Procedure: IRRIGATION AND DEBRIDEMENT FOOT-right foot;  Surgeon: Gwyneth Revels, DPM;  Location: [facility];  Service: Podiatry;  Laterality: Right;   KNEE SURGERY Right 05/24/2020   LEFT HEART CATH AND CORONARY ANGIOGRAPHY Left 02/27/2011   Procedure: LEFT HEART CATH AND CORONARY ANGIOGRAPHY   PAIN PUMP REVISION N/A 08/15/2018   Procedure: Intrathecal PUMP REVISION;  Surgeon: Odette Fraction, MD;  Location: [facility];  Service: Neurosurgery;  Laterality: N/A;  INTRATHECAL PUMP REVISION   PLANTAR FASCIA SURGERY Bilateral    TOE AMPUTATION Right    RIGHT great toe (1st digit)   TOE AMPUTATION Right    a.) RIGHT second toe (2nd digit)   TOE SURGERY     then revision 8/18   TOTAL KNEE ARTHROPLASTY Right 05/24/2020   Procedure: RIGHT TOTAL KNEE ARTHROPLASTY;  Surgeon: Marcene Corning, MD;  Location: [facility];  Service: Orthopedics;  Laterality: Right;   TOTAL KNEE REVISION Right 09/06/2020   : RIGHT KNEE POLY REVISION;  Marcene Corning, MD) PLAN TO DISCHARGE FROM PACU   WOUND DEBRIDEMENT Left 09/28/2021   Procedure: DEBRIDEMENT SKIN, SUBCUTANEOUS TISSUE - 11042;  Surgeon: Rosetta Posner, DPM;  Location: [facility];  Service: Podiatry;  Laterality: Left;  Diabetic   Family History  Problem Relation Age of Onset   Cancer Mother    Lung  cancer Mother    Arthritis Father    Heart disease Father    Diabetes Father    Dementia Maternal Grandmother    Alcohol abuse Paternal Grandfather    Heart disease Paternal Grandfather    Stroke Paternal Grandfather    Social History   Tobacco Use   Smoking status: Former    Packs/day: 1.50    Years: 32.00    Total pack years: 48.00    Types: Cigarettes    Quit date: 07/22/2007    Years since quitting: 14.4   Smokeless tobacco: Never  Vaping Use   Vaping Use: Never used  Substance Use Topics   Alcohol use: Yes    Comment: occ - Holidays   Drug use: Never  Comment: prescribed pain pump and oxy    Pertinent Clinical Results:  LABS: Labs reviewed: Acceptable for surgery.  Hospital Outpatient Visit on 12/11/2021  Component Date Value Ref Range Status   WBC 12/11/2021 5.7  4.0 - 10.5 K/uL Final   RBC 12/11/2021 4.39  3.87 - 5.11 MIL/uL Final   Hemoglobin 12/11/2021 12.3  12.0 - 15.0 g/dL Final   HCT 12/11/2021 38.1  36.0 - 46.0 % Final   MCV 12/11/2021 86.8  80.0 - 100.0 fL Final   MCH 12/11/2021 28.0  26.0 - 34.0 pg Final   MCHC 12/11/2021 32.3  30.0 - 36.0 g/dL Final   RDW 12/11/2021 11.9  11.5 - 15.5 % Final   Platelets 12/11/2021 184  150 - 400 K/uL Final   nRBC 12/11/2021 0.0  0.0 - 0.2 % Final   Performed at [facility], [address] [place], [place] [address]   Lab Results  Component Value Date   NA 139 10/23/2021   K 4.8 10/23/2021   CO2 31 06/22/2021   GLUCOSE 61 (L) 10/23/2021   BUN 21 10/23/2021   CREATININE 1.29 (H) 10/23/2021   CALCIUM 9.8 10/23/2021   EGFR 46 (L) 10/23/2021   GFRNONAA 56 (L) 12/21/2020    ECG: Date: 07/12/2021 Time ECG obtained: 1124 AM Rate: 66 bpm Rhythm: normal sinus Axis (leads I and aVF): Normal Intervals: PR 148 ms. QRS 86 ms. QTc 419 ms. ST segment and T wave changes: No evidence of acute ST segment elevation or depression Comparison: Similar to previous tracing obtained on 02/11/2020   IMAGING /  PROCEDURES: CT ABDOMEN PELVIS WO CONTRAST performed on 11/15/2021 Right lower quadrant subcutaneous infusion pump with interval increase in size of fluid collection surrounding the pump, now measuring 12.5 x 10.6 x 10.1 cm. This causes mass effect on the anterior abdominal wall both anterior and posteriorly. Questionable discontinuity about the right aspect of the catheter within the fluid collection, although this area is not well assessed due to adjacent metallic streak artifact. Thin non radiopaque soft tissue tract extending from the infusion pump posterolaterally in the abdominal wall extending to a radiopaque portion posterior to T12-L1. This tract previously contained radiopaque catheter. There is no discontinuity of this tract. The intrathecal portion of the catheter is better assessed on concurrent thoracic and lumbar spine CT, reported separately. Moderate colonic stool burden. Mild colonic diverticulosis without diverticulitis. Small hiatal hernia. Aortic atherosclerosis  VASCULAR US CAROTID performed on 10/20/2021 Right Carotid: Velocities in the right ICA are consistent with a 40-59% stenosis. The ECA appears >50% stenosed.  Left Carotid: Velocities in the left ICA are consistent with a 1-39% stenosis.  Vertebrals:  Bilateral vertebral arteries demonstrate antegrade flow.  Subclavians: Normal flow hemodynamics were seen in bilateral subclavian arteries.   MYOCARDIAL PERFUSION IMAGING STUDY (LEXISCAN) performed on 10/12/2021 Normal left ventricular systolic function with hyperdynamic LVEF of >11% End-diastolic cavity size is normal small apical perfusion defect in rest and stress with normal wall motion likely related to apical thinning No evidence of stress-induced myocardial ischemia or arrhythmia Low risk study  TRANSTHORACIC ECHOCARDIOGRAM performed on 04/22/2019 Left ventricular ejection fraction, by visual estimation, is 60 to 65%. The left ventricle has normal function. Normal  left ventricular size. There is no left ventricular hypertrophy. Normal wall motion. Normal diastolic function.  Global right ventricle has normal systolic function.The right ventricular size is normal. No increase in right ventricular wall thickness.  Left atrial size was normal.  Right atrial size was normal.  The mitral valve is normal in structure. No evidence of mitral valve regurgitation. No evidence of mitral stenosis.  The tricuspid valve is normal in structure. Tricuspid valve regurgitation is trivial.  The aortic valve is tricuspid Aortic valve regurgitation was not visualized by color flow Doppler. Structurally normal aortic valve, with no evidence of sclerosis or stenosis.  The tricuspid regurgitant velocity is 2.59 m/s, and with an assumed right atrial pressure of 8 mmHg, the estimated right ventricular systolic pressure is mildly elevated at 34.8 mmHg.  The inferior vena cava is normal in size with greater than 50% respiratory variability, suggesting right atrial pressure of 3 mmHg.  Impression and Plan:  Melinda [name] has been referred for pre-anesthesia review and clearance prior to her undergoing the planned anesthetic and procedural courses. Available labs, pertinent testing, and imaging results were personally reviewed by me. This patient has been appropriately cleared by cardiology with an overall ACCEPTABLE risk of significant perioperative cardiovascular complications.  Based on clinical review performed today (12/13/21), barring any significant acute changes in the patient's overall condition, it is anticipated that she will be able to proceed with the planned surgical intervention. Any acute changes in clinical condition may necessitate her procedure being postponed and/or cancelled. Patient will meet with anesthesia team (MD and/or CRNA) on the day of her procedure for preoperative evaluation/assessment. Questions regarding anesthetic course will be fielded at that time.    Pre-surgical instructions were reviewed with the patient during her PAT appointment and questions were fielded by PAT clinical staff. Patient was advised that if any questions or concerns arise prior to her procedure then she should return a call to PAT and/or her surgeon's office to discuss.  Honor Loh, MSN, APRN, FNP-C, CEN [facility]  Peri-operative Services Nurse Practitioner Phone: [phone] Fax: [phone] 12/13/21 3:19 PM  NOTE: This note has been prepared using Dragon dictation software. Despite my best ability to proofread, there is always the potential that unintentional transcriptional errors may still occur from this process.

## 2021-12-13 ENCOUNTER — Encounter: Payer: Self-pay | Admitting: Pain Medicine

## 2021-12-14 ENCOUNTER — Other Ambulatory Visit: Payer: Self-pay

## 2021-12-14 ENCOUNTER — Ambulatory Visit: Payer: Medicare Other | Admitting: Urgent Care

## 2021-12-14 ENCOUNTER — Encounter
Admission: RE | Disposition: A | Discharge status: Home / Self Care | Payer: Self-pay | Source: Ambulatory Visit | Attending: Pain Medicine

## 2021-12-14 ENCOUNTER — Ambulatory Visit
Admission: RE | Admit: 2021-12-14 | Discharge: 2021-12-14 | Disposition: A | Discharge status: Home / Self Care | Payer: Medicare Other | Source: Ambulatory Visit | Attending: Pain Medicine | Admitting: Pain Medicine

## 2021-12-14 ENCOUNTER — Encounter: Payer: Self-pay | Admitting: Pain Medicine

## 2021-12-14 DIAGNOSIS — Y758 Miscellaneous neurological devices associated with adverse incidents, not elsewhere classified: Secondary | ICD-10-CM | POA: Diagnosis not present

## 2021-12-14 DIAGNOSIS — M4125 Other idiopathic scoliosis, thoracolumbar region: Secondary | ICD-10-CM | POA: Insufficient documentation

## 2021-12-14 DIAGNOSIS — M4726 Other spondylosis with radiculopathy, lumbar region: Secondary | ICD-10-CM | POA: Diagnosis not present

## 2021-12-14 DIAGNOSIS — M545 Low back pain, unspecified: Secondary | ICD-10-CM | POA: Diagnosis present

## 2021-12-14 DIAGNOSIS — E1142 Type 2 diabetes mellitus with diabetic polyneuropathy: Secondary | ICD-10-CM | POA: Diagnosis not present

## 2021-12-14 DIAGNOSIS — T85615A Breakdown (mechanical) of other nervous system device, implant or graft, initial encounter: Secondary | ICD-10-CM | POA: Diagnosis not present

## 2021-12-14 DIAGNOSIS — G8929 Other chronic pain: Secondary | ICD-10-CM | POA: Diagnosis present

## 2021-12-14 DIAGNOSIS — M47812 Spondylosis without myelopathy or radiculopathy, cervical region: Secondary | ICD-10-CM | POA: Insufficient documentation

## 2021-12-14 DIAGNOSIS — M533 Sacrococcygeal disorders, not elsewhere classified: Secondary | ICD-10-CM | POA: Diagnosis not present

## 2021-12-14 DIAGNOSIS — Z8249 Family history of ischemic heart disease and other diseases of the circulatory system: Secondary | ICD-10-CM | POA: Diagnosis not present

## 2021-12-14 DIAGNOSIS — M797 Fibromyalgia: Secondary | ICD-10-CM | POA: Insufficient documentation

## 2021-12-14 DIAGNOSIS — I129 Hypertensive chronic kidney disease with stage 1 through stage 4 chronic kidney disease, or unspecified chronic kidney disease: Secondary | ICD-10-CM | POA: Insufficient documentation

## 2021-12-14 DIAGNOSIS — N183 Chronic kidney disease, stage 3 unspecified: Secondary | ICD-10-CM | POA: Diagnosis not present

## 2021-12-14 DIAGNOSIS — I2510 Atherosclerotic heart disease of native coronary artery without angina pectoris: Secondary | ICD-10-CM | POA: Diagnosis not present

## 2021-12-14 DIAGNOSIS — Z87891 Personal history of nicotine dependence: Secondary | ICD-10-CM | POA: Diagnosis not present

## 2021-12-14 DIAGNOSIS — Z79891 Long term (current) use of opiate analgesic: Secondary | ICD-10-CM | POA: Insufficient documentation

## 2021-12-14 DIAGNOSIS — T85610S Breakdown (mechanical) of epidural and subdural infusion catheter, sequela: Secondary | ICD-10-CM

## 2021-12-14 DIAGNOSIS — G894 Chronic pain syndrome: Secondary | ICD-10-CM | POA: Diagnosis not present

## 2021-12-14 DIAGNOSIS — M48062 Spinal stenosis, lumbar region with neurogenic claudication: Secondary | ICD-10-CM | POA: Insufficient documentation

## 2021-12-14 DIAGNOSIS — T85620A Displacement of epidural and subdural infusion catheter, initial encounter: Secondary | ICD-10-CM | POA: Diagnosis present

## 2021-12-14 DIAGNOSIS — F32A Depression, unspecified: Secondary | ICD-10-CM | POA: Diagnosis not present

## 2021-12-14 DIAGNOSIS — E1122 Type 2 diabetes mellitus with diabetic chronic kidney disease: Secondary | ICD-10-CM | POA: Diagnosis not present

## 2021-12-14 DIAGNOSIS — T888XXA Other specified complications of surgical and medical care, not elsewhere classified, initial encounter: Secondary | ICD-10-CM | POA: Diagnosis present

## 2021-12-14 DIAGNOSIS — E785 Hyperlipidemia, unspecified: Secondary | ICD-10-CM | POA: Insufficient documentation

## 2021-12-14 DIAGNOSIS — M170 Bilateral primary osteoarthritis of knee: Secondary | ICD-10-CM | POA: Insufficient documentation

## 2021-12-14 DIAGNOSIS — M96843 Postprocedural seroma of a musculoskeletal structure following other procedure: Secondary | ICD-10-CM | POA: Diagnosis present

## 2021-12-14 DIAGNOSIS — M961 Postlaminectomy syndrome, not elsewhere classified: Secondary | ICD-10-CM | POA: Diagnosis present

## 2021-12-14 DIAGNOSIS — M549 Dorsalgia, unspecified: Secondary | ICD-10-CM | POA: Diagnosis not present

## 2021-12-14 DIAGNOSIS — T85610A Breakdown (mechanical) of epidural and subdural infusion catheter, initial encounter: Secondary | ICD-10-CM | POA: Diagnosis present

## 2021-12-14 DIAGNOSIS — S301XXA Contusion of abdominal wall, initial encounter: Secondary | ICD-10-CM | POA: Diagnosis present

## 2021-12-14 DIAGNOSIS — Z978 Presence of other specified devices: Secondary | ICD-10-CM

## 2021-12-14 DIAGNOSIS — Z955 Presence of coronary angioplasty implant and graft: Secondary | ICD-10-CM | POA: Insufficient documentation

## 2021-12-14 DIAGNOSIS — G4733 Obstructive sleep apnea (adult) (pediatric): Secondary | ICD-10-CM | POA: Diagnosis not present

## 2021-12-14 DIAGNOSIS — Z01818 Encounter for other preprocedural examination: Secondary | ICD-10-CM

## 2021-12-14 DIAGNOSIS — Z969 Presence of functional implant, unspecified: Secondary | ICD-10-CM

## 2021-12-14 DIAGNOSIS — E7800 Pure hypercholesterolemia, unspecified: Secondary | ICD-10-CM | POA: Diagnosis not present

## 2021-12-14 DIAGNOSIS — M5441 Lumbago with sciatica, right side: Secondary | ICD-10-CM | POA: Diagnosis present

## 2021-12-14 HISTORY — DX: Diaphragmatic hernia without obstruction or gangrene: K44.9

## 2021-12-14 HISTORY — DX: Type 2 diabetes mellitus without complications: E11.9

## 2021-12-14 HISTORY — PX: INTRATHECAL PUMP REVISION: SHX6810

## 2021-12-14 HISTORY — DX: Unspecified osteoarthritis, unspecified site: M19.90

## 2021-12-14 HISTORY — DX: Contusion of abdominal wall, initial encounter: S30.1XXA

## 2021-12-14 HISTORY — DX: Chronic kidney disease, stage 3 unspecified: N18.30

## 2021-12-14 HISTORY — DX: Pseudarthrosis after fusion or arthrodesis: M96.0

## 2021-12-14 HISTORY — DX: Atherosclerosis of aorta: I70.0

## 2021-12-14 HISTORY — DX: Opioid use, unspecified, uncomplicated: F11.90

## 2021-12-14 HISTORY — DX: Diverticulosis of intestine, part unspecified, without perforation or abscess without bleeding: K57.90

## 2021-12-14 LAB — GLUCOSE, CAPILLARY
Glucose-Capillary: 120 mg/dL — ABNORMAL HIGH (ref 70–99)
Glucose-Capillary: 106 mg/dL — ABNORMAL HIGH (ref 70–99)

## 2021-12-14 SURGERY — INTRATHECAL PUMP REVISION
Anesthesia: General | Laterality: Right

## 2021-12-14 MED ORDER — FENTANYL CITRATE (PF) 100 MCG/2ML IJ SOLN
INTRAMUSCULAR | Status: DC | PRN
  Administered 2021-12-14 (×2): 50 ug via INTRAVENOUS

## 2021-12-14 MED ORDER — FAMOTIDINE 20 MG PO TABS: 20.0000 mg | ORAL_TABLET | Freq: Once | ORAL | Status: AC

## 2021-12-14 MED ORDER — ACETAMINOPHEN 500 MG PO TABS
ORAL_TABLET | ORAL | Status: AC
  Administered 2021-12-14: 1000 mg via ORAL
  Filled 2021-12-14: qty 2

## 2021-12-14 MED ORDER — ACETAMINOPHEN 500 MG PO TABS: 1000.0000 mg | ORAL_TABLET | Freq: Once | ORAL | Status: AC

## 2021-12-14 MED ORDER — ONDANSETRON HCL 4 MG/2ML IJ SOLN: 4.0000 mg | Freq: Four times a day (QID) | INTRAMUSCULAR | Status: DC | PRN

## 2021-12-14 MED ORDER — FENTANYL CITRATE (PF) 100 MCG/2ML IJ SOLN: 25.0000 ug | INTRAMUSCULAR | Status: DC | PRN

## 2021-12-14 MED ORDER — ORAL CARE MOUTH RINSE
15.0000 mL | Freq: Once | OROMUCOSAL | Status: AC
  Administered 2021-12-14: 15 mL via OROMUCOSAL

## 2021-12-14 MED ORDER — DEXAMETHASONE SODIUM PHOSPHATE 10 MG/ML IJ SOLN
INTRAMUSCULAR | Status: DC | PRN
  Administered 2021-12-14: 10 mg via INTRAVENOUS

## 2021-12-14 MED ORDER — CEFAZOLIN SODIUM-DEXTROSE 2-4 GM/100ML-% IV SOLN
INTRAVENOUS | Status: AC
  Filled 2021-12-14: qty 100

## 2021-12-14 MED ORDER — PROPOFOL 500 MG/50ML IV EMUL: INTRAVENOUS | Status: DC | PRN

## 2021-12-14 MED ORDER — ONDANSETRON HCL 4 MG/2ML IJ SOLN
INTRAMUSCULAR | Status: DC | PRN
  Administered 2021-12-14: 4 mg via INTRAVENOUS

## 2021-12-14 MED ORDER — PROPOFOL 10 MG/ML IV BOLUS
INTRAVENOUS | Status: AC
  Filled 2021-12-14: qty 20

## 2021-12-14 MED ORDER — CHLORHEXIDINE GLUCONATE 0.12 % MT SOLN
OROMUCOSAL | Status: AC
  Filled 2021-12-14: qty 15

## 2021-12-14 MED ORDER — DEXMEDETOMIDINE HCL IN NACL 80 MCG/20ML IV SOLN
INTRAVENOUS | Status: AC
  Filled 2021-12-14: qty 20

## 2021-12-14 MED ORDER — BACITRACIN ZINC 500 UNIT/GM EX OINT
TOPICAL_OINTMENT | CUTANEOUS | Status: AC
  Filled 2021-12-14: qty 56.7

## 2021-12-14 MED ORDER — CHLORHEXIDINE GLUCONATE 0.12 % MT SOLN: 15.0000 mL | Freq: Once | OROMUCOSAL | Status: AC

## 2021-12-14 MED ORDER — DEXMEDETOMIDINE HCL IN NACL 200 MCG/50ML IV SOLN
INTRAVENOUS | Status: DC | PRN
  Administered 2021-12-14 (×2): 4 ug via INTRAVENOUS

## 2021-12-14 MED ORDER — SODIUM CHLORIDE 0.9 % IV SOLN: INTRAVENOUS | Status: DC

## 2021-12-14 MED ORDER — MIDAZOLAM HCL 2 MG/2ML IJ SOLN
INTRAMUSCULAR | Status: DC | PRN
  Administered 2021-12-14: 2 mg via INTRAVENOUS

## 2021-12-14 MED ORDER — CEPHALEXIN 500 MG PO CAPS: 500.0000 mg | ORAL_CAPSULE | Freq: Three times a day (TID) | ORAL | 0 refills | Status: AC

## 2021-12-14 MED ORDER — MIDAZOLAM HCL 2 MG/2ML IJ SOLN
INTRAMUSCULAR | Status: AC
Start: 2021-12-14 — End: ?
  Filled 2021-12-14: qty 2

## 2021-12-14 MED ORDER — OXYCODONE-ACETAMINOPHEN 5-325 MG PO TABS: 1.0000 | ORAL_TABLET | Freq: Four times a day (QID) | ORAL | 0 refills | Status: DC | PRN

## 2021-12-14 MED ORDER — ONDANSETRON HCL 4 MG/2ML IJ SOLN
INTRAMUSCULAR | Status: AC
Start: 2021-12-14 — End: ?
  Filled 2021-12-14: qty 2

## 2021-12-14 MED ORDER — FAMOTIDINE 20 MG PO TABS
ORAL_TABLET | ORAL | Status: AC
  Administered 2021-12-14: 20 mg via ORAL
  Filled 2021-12-14: qty 1

## 2021-12-14 MED ORDER — CEFAZOLIN SODIUM-DEXTROSE 2-4 GM/100ML-% IV SOLN
2.0000 g | INTRAVENOUS | Status: AC
  Administered 2021-12-14: 2 g via INTRAVENOUS

## 2021-12-14 MED ORDER — ACETAMINOPHEN 325 MG PO TABS: 650.0000 mg | ORAL_TABLET | ORAL | Status: DC | PRN

## 2021-12-14 MED ORDER — LIDOCAINE HCL (CARDIAC) PF 100 MG/5ML IV SOSY
PREFILLED_SYRINGE | INTRAVENOUS | Status: DC | PRN
  Administered 2021-12-14: 40 mg via INTRAVENOUS

## 2021-12-14 MED ORDER — LIDOCAINE HCL (PF) 1 % IJ SOLN
INTRAMUSCULAR | Status: AC
  Filled 2021-12-14: qty 30

## 2021-12-14 MED ORDER — MENTHOL 3 MG MT LOZG: 1.0000 | LOZENGE | OROMUCOSAL | Status: DC | PRN

## 2021-12-14 MED ORDER — PROPOFOL 500 MG/50ML IV EMUL
INTRAVENOUS | Status: DC | PRN
  Administered 2021-12-14: 50 mg via INTRAVENOUS
  Administered 2021-12-14: 140 ug/kg/min via INTRAVENOUS

## 2021-12-14 MED ORDER — ACETAMINOPHEN 650 MG RE SUPP: 650.0000 mg | RECTAL | Status: DC | PRN

## 2021-12-14 MED ORDER — DEXAMETHASONE SODIUM PHOSPHATE 10 MG/ML IJ SOLN
INTRAMUSCULAR | Status: AC
  Filled 2021-12-14: qty 1

## 2021-12-14 MED ORDER — ONDANSETRON HCL 4 MG PO TABS: 4.0000 mg | ORAL_TABLET | Freq: Four times a day (QID) | ORAL | Status: DC | PRN

## 2021-12-14 MED ORDER — LIDOCAINE HCL (PF) 1 % IJ SOLN
INTRAMUSCULAR | Status: DC | PRN
  Administered 2021-12-14: 16 mL

## 2021-12-14 MED ORDER — BUPIVACAINE HCL (PF) 0.5 % IJ SOLN
INTRAMUSCULAR | Status: AC
  Filled 2021-12-14: qty 30

## 2021-12-14 MED ORDER — LIDOCAINE HCL (PF) 2 % IJ SOLN
INTRAMUSCULAR | Status: AC
Start: 2021-12-14 — End: ?
  Filled 2021-12-14: qty 5

## 2021-12-14 MED ORDER — FENTANYL CITRATE (PF) 100 MCG/2ML IJ SOLN
INTRAMUSCULAR | Status: AC
  Filled 2021-12-14: qty 2

## 2021-12-14 MED ORDER — PHENOL 1.4 % MT LIQD: 1.0000 | OROMUCOSAL | Status: DC | PRN

## 2021-12-14 MED ORDER — PHENYLEPHRINE HCL (PRESSORS) 10 MG/ML IV SOLN
INTRAVENOUS | Status: DC | PRN
  Administered 2021-12-14 (×2): 80 ug via INTRAVENOUS

## 2021-12-14 SURGICAL SUPPLY — 42 items
BINDER ABDOMINAL 12 ML 46-62 (SOFTGOODS) ×3 IMPLANT
BLADE SURG 15 STRL LF DISP TIS (BLADE) ×2 IMPLANT
BLADE SURG 15 STRL SS (BLADE) ×2
CNTNR SPEC 2.5X3XGRAD LEK (MISCELLANEOUS) ×1
CONT SPEC 4OZ STER OR WHT (MISCELLANEOUS) ×1
CONT SPEC 4OZ STRL OR WHT (MISCELLANEOUS) ×1
CONTAINER SPEC 2.5X3XGRAD LEK (MISCELLANEOUS) ×2 IMPLANT
DRAPE INCISE IOBAN 66X45 STRL (DRAPES) ×3 IMPLANT
DRSG TEGADERM 4X4.75 (GAUZE/BANDAGES/DRESSINGS) ×7 IMPLANT
DRSG TELFA 3X8 NADH (GAUZE/BANDAGES/DRESSINGS) ×4 IMPLANT
DURAPREP 26ML APPLICATOR (WOUND CARE) ×3 IMPLANT
ELECT REM PT RETURN 9FT ADLT (ELECTROSURGICAL) ×2
ELECTRODE REM PT RTRN 9FT ADLT (ELECTROSURGICAL) ×2 IMPLANT
GAUZE 4X4 16PLY ~~LOC~~+RFID DBL (SPONGE) ×3 IMPLANT
GLOVE BIO SURGEON STRL SZ8 (GLOVE) ×3 IMPLANT
GLOVE SURG XRAY 8.5 LX (GLOVE) ×3 IMPLANT
GOWN STRL REUS W/ TWL LRG LVL3 (GOWN DISPOSABLE) ×2 IMPLANT
GOWN STRL REUS W/ TWL XL LVL3 (GOWN DISPOSABLE) ×2 IMPLANT
GOWN STRL REUS W/TWL LRG LVL3 (GOWN DISPOSABLE) ×2
GOWN STRL REUS W/TWL XL LVL3 (GOWN DISPOSABLE) ×2
KIT TURNOVER KIT A (KITS) ×3 IMPLANT
LABEL OR SOLS (LABEL) ×3 IMPLANT
MANIFOLD NEPTUNE II (INSTRUMENTS) ×3 IMPLANT
NDL HYPO 25X1 1.5 SAFETY (NEEDLE) ×2 IMPLANT
NDL SPNL 22GX3.5 QUINCKE BK (NEEDLE) ×2 IMPLANT
NEEDLE HYPO 25X1 1.5 SAFETY (NEEDLE) ×2 IMPLANT
NEEDLE SPNL 22GX3.5 QUINCKE BK (NEEDLE) ×2 IMPLANT
PACK BASIN MINOR ARMC (MISCELLANEOUS) ×3 IMPLANT
PACK UNIVERSAL (MISCELLANEOUS) ×3 IMPLANT
PAD DRESSING TELFA 3X8 NADH (GAUZE/BANDAGES/DRESSINGS) ×4 IMPLANT
SET SPINAL AND EPIDURAL ANES (NEEDLE) ×3 IMPLANT
SOL PREP PVP 2OZ (MISCELLANEOUS) ×2
SOLUTION PREP PVP 2OZ (MISCELLANEOUS) ×2 IMPLANT
STAPLER SKIN PROX 35W (STAPLE) ×3 IMPLANT
SUT PROLENE 0 CT 2 (SUTURE) ×1 IMPLANT
SUT SILK 0 SH 30 (SUTURE) ×3 IMPLANT
SUT SILK 2 0 SH (SUTURE) ×3 IMPLANT
SUT VIC AB 2-0 SH 27 (SUTURE) ×4
SUT VIC AB 2-0 SH 27XBRD (SUTURE) ×4 IMPLANT
SWABSTK COMLB BENZOIN TINCTURE (MISCELLANEOUS) ×3 IMPLANT
SYR 10ML LL (SYRINGE) ×6 IMPLANT
WATER STERILE IRR 1000ML POUR (IV SOLUTION) ×3 IMPLANT

## 2021-12-14 NOTE — Anesthesia Preprocedure Evaluation (Signed)
Anesthesia Evaluation  Patient identified by MRN, date of birth, ID band Patient awake    Reviewed: Allergy & Precautions, H&P , NPO status , Patient's Chart, lab work & pertinent test results  History of Anesthesia Complications Negative for: history of anesthetic complications  Airway Mallampati: II  TM Distance: >3 FB     Dental  (+) Chipped, Dental Advidsory Given, Poor Dentition   Pulmonary neg shortness of breath, sleep apnea , neg COPD, neg recent URI, former smoker,    breath sounds clear to auscultation       Cardiovascular hypertension, (-) angina+ CAD and + Cardiac Stents  (-) Past MI and (-) CABG + dysrhythmias (h/o WPW s/p ablation) (-) Valvular Problems/Murmurs Rhythm:regular Rate:Normal     Neuro/Psych neg Seizures PSYCHIATRIC DISORDERS Anxiety Depression Peripheral neuropathy  Neuromuscular disease (fibromyalgia)    GI/Hepatic negative GI ROS, Neg liver ROS,   Endo/Other  diabetes  Renal/GU Renal disease (CKD)     Musculoskeletal   Abdominal   Peds  Hematology negative hematology ROS (+)   Anesthesia Other Findings Past Medical History: 05/30/2010: Amputation of great toe, right, traumatic ([place]) 09/2017: Amputation of second toe, right, traumatic ([facility]) No date: Anemia     Comment:  years ago No date: Anxiety No date: Blue toes     Comment:  2nd toe on right foot, will get appt. No date: Bulging disc 2009: CAD (coronary artery disease)     Comment:  s/p stent to LAD No date: Cardiac arrhythmia due to congenital heart disease     Comment:  WPW.  Ablations done.  Now has rare episodes No date: Chicken pox No date: Chronic fatigue No date: Chronic kidney disease     Comment:  "problem with kidney filtration" No date: Coronary arteritis No date: Degenerative disc disease No date: Depression No date: Diabetes mellitus without complication (HCC) No date: Facet joint disease No date:  Fibromyalgia No date: Heart disease No date: Hyperlipidemia No date: IBS (irritable bowel syndrome) No date: MCL deficiency, knee 2011: MRSA (methicillin resistant staph aureus) culture positive     Comment:  GREAT TOE RIGHT FOOT 04/18/2010: Neuropathy 01/2020: Orthostatic hypotension No date: Peripheral neuropathy No date: Renal insufficiency No date: Restless leg syndrome 09/2013: Sepsis ([place]) 08/17/2003: Sleep apnea     Comment:  uses CPAP, sleep study at [facility] (mild to               moderate) No date: Spinal stenosis  Past Surgical History: No date: ABLATION     Comment:  UTERUS No date: ABLATION     Comment:  HEART 02/01/2017: AMPUTATION TOE; Right     Comment:  Procedure: AMPUTATION TOE-RIGHT 2ND MPJ;  Surgeon:               Albertine Patricia, DPM;  Location: [facility];  Service:               Podiatry;  Laterality: Right; 07/28/2014: ANTERIOR CERVICAL DECOMP/DISCECTOMY FUSION; N/A     Comment:  Procedure: ANTERIOR CERVICAL DECOMPRESSION/DISCECTOMY               FUSION CERVICAL 3-4,4-5,5-6 LEVELS WITH INSTRUMENTATION               AND ALLOGRAFT;  Surgeon: Sinclair Ship, MD;                Location: [place];  Service: Orthopedics;  Laterality: N/A;  Anterior cervical decompression fusion, cervical 3-4,               cervical 4-5, cervical 5-6 with instrumentation and               allograft No date: BACK SURGERY     Comment:  X 3 1979, 1994, 1995 No date: [place]; Right 2003: CHOLECYSTECTOMY 2008: CORONARY ANGIOPLASTY 2013: EYE SURGERY; Bilateral     Comment:  Eyelid lift  No date: FOOT SURGERY; Right     Comment:  BIG TOE No date: FOOT SURGERY; Bilateral     Comment:  PLANTAR FASCIITIS No date: FOOT SURGERY; Right     Comment:  2ND TOE No date: GALLBLADDER SURGERY 10/17/2017: HAMMER TOE SURGERY; Right     Comment:  Procedure: HAMMER TOE CORRECTION-4TH TOE;  Surgeon:               Albertine Patricia, DPM;  Location: [place];                Service: Podiatry;  Laterality: Right;  LMA- WITH               LOCAL Diabetic - diet controlled No date: HAND SURGERY; Left No date: HAND SURGEY; Left 2009: HEART STENT     Comment:  LAD No date: INFUSION PUMP IMPLANTATION     Comment:  X2 with morphine and baclofen 04/25/2018: INTRATHECAL PUMP REVISION; N/A     Comment:  Procedure: Intrathecal pump replacement;  Surgeon:               Clydell Hakim, MD;  Location: [place];  Service:               Neurosurgery;  Laterality: N/A;  right 04/25/2018: INTRATHECAL PUMP REVISION; Right 12/12/2018: INTRATHECAL PUMP REVISION; N/A     Comment:  Procedure: Intrathecal pump revision with exploration of              pocket;  Surgeon: Clydell Hakim, MD;  Location: [place];                Service: Neurosurgery;  Laterality: N/A;  Intrathecal               pump revision with exploration of pocket 02/23/2017: IRRIGATION AND DEBRIDEMENT FOOT; Right     Comment:  Procedure: [id number] AND DEBRIDEMENT FOOT-right foot;                Surgeon: Samara Deist, DPM;  Location: [facility];                Service: Podiatry;  Laterality: Right; 08/15/2018: PAIN PUMP REVISION; N/A     Comment:  Procedure: Intrathecal PUMP REVISION;  Surgeon: Clydell Hakim, MD;  Location: [place];  Service: Neurosurgery;                Laterality: N/A;  INTRATHECAL PUMP REVISION No date: TOE SURGERY     Comment:  then revision 8/18  BMI    Body Mass Index: 31.95 kg/m      Reproductive/Obstetrics negative OB ROS                             Anesthesia Physical  Anesthesia Plan  ASA: 3  Anesthesia Plan: General   Post-op Pain Management:    Induction:   PONV Risk Score and  Plan: Treatment may vary due to age or medical condition, Propofol infusion and TIVA  Airway Management Planned: Natural Airway and Nasal Cannula  Additional Equipment:   Intra-op Plan:   Post-operative Plan:   Informed Consent:  I have reviewed the patients History and Physical, chart, labs and discussed the procedure including the risks, benefits and alternatives for the proposed anesthesia with the patient or authorized representative who has indicated his/her understanding and acceptance.     Dental Advisory Given  Plan Discussed with: Anesthesiologist, CRNA and Surgeon  Anesthesia Plan Comments:         Anesthesia Quick Evaluation

## 2021-12-14 NOTE — Brief Op Note (Signed)
Date: 12/14/2021  Time: 2:21 PM  Patient: Melinda [name]  (66 y.o. female) Diagnosis Pre-op: Non-functional intrathecal pump Post-op: Broken intrathecal catheter near pump reservoir Procedure: Removal of intrathecal pump and catheter Operating Room: [place] 01  Surgeon: [name], MD  Assistant(s): None  Anesthesia: General  Anesthesia Staff:  Anesthesiologist: Martha Clan, MD CRNA: Cammie Sickle, CRNA EBL: 5 mL  Blood Administered: None Drains: None Meds ordered this encounter  Medications   ceFAZolin (ANCEF) IVPB 2g/100 mL premix    Order Specific Question:   Indication:    Answer:   Surgical Prophylaxis   famotidine (PEPCID) tablet 20 mg   0.9 %  sodium chloride infusion   OR Linked Order Group    chlorhexidine (PERIDEX) 0.12 % solution 15 mL    Oral care mouth rinse   chlorhexidine (PERIDEX) 0.12 % solution    Quentin Cornwall, Anabella H: cabinet override   famotidine (PEPCID) 20 MG tablet    Quentin Cornwall, Anabella H: cabinet override   ceFAZolin (ANCEF) 2-4 GM/100ML-% IVPB    Quentin Cornwall, Anabella H: cabinet override   bupivacaine (MARCAINE) 30 mL, lidocaine (PF) (XYLOCAINE) 1 % 30 mL   Specimen & disposition: Transfer to recovery and then discharge home when cleared by anesthesia Complete Count: YES Tourniquet: None Dictation: Note written in EPIC Plan of Care: Discharge to 01-Home or Self Care after PACU  Patient Disposition: F/U at Pain Clinic as outpatient. Implants     Anchor   Anchor Corkscrew Bio 5.73mm - [id number] - Implanted   (Right) Knee    Inventory item: ANCHOR CORKSCREW BIO 5.5 Model/Cat number: [id number]   Manufacturer: [employer] Lot number: [id number]    As of 09/06/2020     Status: Implanted               Bone Implant   Lordotic Cervical Vbr 82mm Sm - [id number] - Implanted  Neck    Inventory item: LORDOTIC CERVICAL VBR 7MM SM Model/Cat number: [id number]   Manufacturer: [employer]      As of 07/28/2014     Status: Implanted                Cement   Cement Hv Smart Set - [id number] - Implanted   (Right) Knee    Inventory item: CEMENT HV SMART SET Model/Cat number: [id number]   Manufacturer: DEPUY SYNTHES Lot number: [id number]    As of 05/24/2020     Status: Implanted               Femur   Attune Ps Fem Rt Sz 8 Cem Knee - [id number] - Implanted   (Right) Knee    Inventory item: ATTUNE PS FEM RT SZ 8 CEM KNEE Model/Cat number: [id number]   Manufacturer: Dewitt Hoes Lot number: [id number]    As of 05/24/2020     Status: Implanted               Insert   Attune Psrp Insr Sz8 66mm Knee - [id number] - Implanted   (Right) Knee    Inventory item: ATTUNE PSRP INSR SZ8 5 KNEE Model/Cat number: [id number]   Manufacturer: Dewitt Hoes Lot number: [id number]    As of 05/24/2020     Status: Implanted               Attune Psrp Insr Sz8 3mm Knee - [id number] - Implanted   (Right) Knee    Inventory item: ATTUNE PSRP INSR SZ8 8 KNEE Model/Cat number: [id number]   Manufacturer: Dewitt Hoes  Lot number: [id number]    As of 09/06/2020     Status: Implanted               Knees   Tibial Base Rot Plat Sz 8 Knee - [id number] - Implanted   (Right) Knee    Inventory item: TIBIAL BASE ROT PLAT SZ 8 KNEE Model/Cat number: [id number]   Manufacturer: Dewitt Hoes Lot number: [id number]    As of 05/24/2020     Status: Implanted               Attune Med Almyra Deforest 64mm Knee - [id number] - Implanted   (Right) Knee    Inventory item: ATTUNE MED DOME PAT 41 KNEE Model/Cat number: [id number]   Manufacturer: Dewitt [employer]es Lot number: [id number]    As of 05/24/2020     Status: Implanted               Mesh General   Pouch Tyrx Neuro Large - [id number] - Implanted  Abdomen    Inventory item: Starr Sinclair LRG Model/Cat number: [id number]   Manufacturer: [employer]UR[employer]IC [employer]CHNOL Lot number: [id number]    As of 08/15/2018     Status: Implanted               Pouch Tyrx Neuro Large -  [id number] - Implanted   (Right) Thoracic Level 11-12    Inventory item: Starr Sinclair LRG Model/Cat number: [id number]   Manufacturer: [employer]UR[employer]CHNOL Lot number: [id number]    As of 03/11/2020     Status: Implanted               Neuro Prosthesis/Implant   Envelope Absorb Antibacterial - [id number] - Implanted   (Right) Abdomen    Inventory item: ENVELOPE ABSORB ANTIBACTERIAL Model/Cat number: [id number]   Manufacturer: [employer] SOFAMOR DANEK Lot number: [id number]    As of 04/25/2018     Status: Implanted               Orthopedic Implant   Implant S Endoskel Tc 22mm Odeg - [id number] - Implanted  Neck    Inventory item: IMPLANT S ENDOSKEL TC 7 ODEG Model/Cat number: [id number]   Manufacturer: [employer]      As of 07/28/2014     Status: Implanted               Implant S Endoskel Tc 55mm Odeg - [id number] - Implanted  Neck    Inventory item: IMPLANT S ENDOSKEL TC 8MM ODEG Model/Cat number: [id number]   Manufacturer: [employer]      As of 07/28/2014     Status: Implanted               Plate   Plate Skyline 3 Lvl 68SH - [id number] - Implanted  Neck    Inventory item: PLATE SKYLINE 3 LVL 11BZ Model/Cat number: [id number]   Manufacturer: Lurena Nida [employer] SPINE      As of 07/28/2014     Status: Implanted               Pump   Pump - Implanted      Model/Cat number: [id number] Manufacturer: MEDTRONIC NEUROMOD PAIN MGMT    As of 02/22/2017     Status: Implanted               Putty   Putty 5cc Dbx Mix - [id number] - Implanted  Neck    Inventory item: PUTTY BONE DBX 5CC MIX Model/Cat number: [id number]   Serial  number: [id number] Manufacturer: Gerilyn Nestle FNDN   Lot number: [id number]      As of 07/28/2014     Status: Implanted               Screw   Screw Var Self Tap Skyline 78m - [id number] - Implanted  Neck    Inventory item: SCREW VAR SELF TAP SKYLINE 48M Model/Cat number: [id number]   Manufacturer: Lurena Nida Mitchell      As of 07/28/2014     Status: Implanted               Type Not Specified   Acis 24mm Screw - Implanted  Neck    Model/Cat number: [id number] Manufacturer: Lurena Nida [employer] SPINE    As of 07/28/2014     Status: Implanted               Pump Connector Rev Kit Sutless - [id number] - Implanted   (Right) Abdomen    Inventory item: PUMP CONNECTOR REV KIT SUTLESS Model/Cat number: [id number]   Manufacturer: [employer] SOFAMOR [place] Lot number: [id number]    As of 04/25/2018     Status: Implanted               Bio-Tenodesis Disp Kit - Implanted   (Right) Knee    Lot number: [id number]      As of 09/06/2020     Status: Implanted

## 2021-12-14 NOTE — Discharge Instructions (Signed)
AMBULATORY SURGERY  ?DISCHARGE INSTRUCTIONS ? ? ?The drugs that you were given will stay in your system until tomorrow so for the next 24 hours you should not: ? ?Drive an automobile ?Make any legal decisions ?Drink any alcoholic beverage ? ? ?You may resume regular meals tomorrow.  Today it is better to start with liquids and gradually work up to solid foods. ? ?You may eat anything you prefer, but it is better to start with liquids, then soup and crackers, and gradually work up to solid foods. ? ? ?Please notify your doctor immediately if you have any unusual bleeding, trouble breathing, redness and pain at the surgery site, drainage, fever, or pain not relieved by medication. ? ? ? ?Additional Instructions: ? ? ? ?Please contact your physician with any problems or Same Day Surgery at [phone], Monday through Friday 6 am to 4 pm, or [place] [facility] [place] Main number at [phone].  ?

## 2021-12-14 NOTE — Transfer of Care (Signed)
Immediate Anesthesia Transfer of Care Note  Patient: Melinda [name]  Procedure(s) Performed: INTRATHECAL PUMP REMOVAL (Right)  Patient Location: PACU  Anesthesia Type:General  Level of Consciousness: drowsy  Airway & Oxygen Therapy: Patient Spontanous Breathing and Patient connected to face mask oxygen  Post-op Assessment: Report given to RN and Post -op Vital signs reviewed and stable  Post vital signs: Reviewed and stable  Last Vitals:  Vitals Value Taken Time  BP 117/68 12/14/21 1432  Temp 36.7 C 12/14/21 1432  Pulse 70 12/14/21 1434  Resp 16 12/14/21 1432  SpO2 99 % 12/14/21 1434  Vitals shown include unvalidated device data.  Last Pain:  Vitals:   12/14/21 1432  TempSrc:   PainSc: Asleep         Complications: No notable events documented.

## 2021-12-14 NOTE — Op Note (Signed)
PROVIDER NOTE: Information contained herein reflects review and annotations entered in association with encounter. Interpretation of such information and data should be left to medically-trained personnel. Information provided to patient can be located elsewhere in the medical record under "Patient Instructions". Document created using STT-dictation technology, any transcriptional errors that may result from process are unintentional.    Patient: Melinda [name]  Service Category: Surgery  Provider: Gaspar Cola, MD  DOB: [birth year]  DOS: 11/30/2021  Location: [employer]  MRN: [id number]  Setting: Ambulatory - outpatient  Referring Provider: No ref. provider found  Type: Established Patient  Specialty: Interventional Pain Management  PCP: Jearld Fenton, NP   Primary Reason for Visit: Interventional Pain Management Treatment. CC: No chief complaint on file.   Operative Note (CPT: [phone] )   Planned intervention: Permanent implantable intrathecal medication delivery system removal (Pump/Catheter Surgical Removal) Pre-op Dx: Malfunction of intrathecal infusion pump Procedure: INTRATHECAL PUMP REMOVAL: 85462 (CPT)  Post-op Dx: Malfunction of intrathecal infusion pump Laterality: Right (-RT)  Catheter insertion level: Lumbar  Imaging: Not required DOS: 12/14/2021  Time: 7:44 PM Operating Room: [facility] OR ROOM 01  Surgeon: [name], MD  Assistant(s): None Anesthesia: General  Anesthesia Staff:  Anesthesiologist: Martha Clan, MD CRNA: Cammie Sickle, CRNA OR Staff:  Circulator: Vena Rua, Donald Prose, RN Scrub Person: Melissa Noon C EBL: Minimal  Blood Administered: None Drains: None Specimen & disposition: * No specimens in log * Complete Count: YES Tourniquet: * No tourniquets in log *  Performed by: Gaspar Cola, MD    Target: Intrathecal catheter and pump reservoir Catheter Location: Right subcutaneous path from anterior abdominal  region to intraspinal insertion site on lower back region. Pump/reservoir Location: Right abdominal wall. Region: Lumbar/Abdominal  Type of procedure: Surgical   Position / Prep / Materials:  Position: Lateral Decubitus with Pump implant side up  Prep solution: DuraPrep (Iodine Povacrylex [0.7% available iodine] and Isopropyl Alcohol, 74% w/w) Prep Area: Abdominal area over implant, flank, and thoraco-lumbar region  Materials:  Tray: Surgical Needle(s):  Type: Spinal  Gauge (G): 22  Length: 3.5-in  Qty: 1  Imaging Guidance  Type of Imaging Technique: None required.  Pre-Procedure Preparation:  Monitoring: As per clinic protocol. Respiration, ETCO2, SpO2, BP, heart rate and rhythm monitor placed and checked for adequate function Safety Precautions: Patient was assessed for positional comfort and pressure points before starting the procedure. Time-out: I initiated and conducted the "Time-out" before starting the procedure, as per protocol. The patient was asked to participate by confirming the accuracy of the "Time Out" information. Verification of the correct person, site, and procedure were performed and confirmed by me, the nursing staff, and the patient. "Time-out" conducted as per Joint Commission's Universal Protocol (UP.01.01.01).  Description/Narrative of Procedure:          Procedural Technique Safety Precautions: Aspiration looking for blood return was conducted prior to all injections. At no point did we inject any substances, as a needle was being advanced. No attempts were made at seeking any paresthesias. Safe injection practices and needle disposal techniques used. Medications properly checked for expiration dates. SDV (single dose vial) medications used. Description of the Procedure: Protocol guidelines were followed. The patient was assisted into a comfortable position. The target area was identified and the area prepped in the usual manner. Skin & deeper tissues infiltrated  with local anesthetic. Appropriate amount of time allowed to pass for local anesthetics to take effect.  Technical description of procedure:  Once the  local anesthetic had taken effect and the patient was provided with sedation, an incision was made in the back, where the intrathecal catheter anchor was palpated.  Surgical dissection was performed to expose the anchor and the catheter.  The catheter was removed from the intrathecal space and a pursestring suture was placed around the connection with the intrathecal canal.  The anchor was removed and separated from the catheter.  A second incision was then made over the pump reservoir exposing the device.  Surgical dissection was performed in order to explant the device from the abdominal pocket.  Both the reservoir and the intrathecal catheter were removed in their entirety.  Both pockets were then irrigated and hemostasis achieved using the cautery.  Both sides of the incision were approximated using Vicryl sutures.  The skin was closed using surgical staples.  Antibiotic ointment was placed over the incision and cover by Telfa.  The incisions were then covered by a nonocclusive transparent dressing (Tegaderm/OpSite).  The patient tolerated the entire procedure well and was then transported to the recovery area for observation by anesthesia.  Vitals:   12/14/21 1530 12/14/21 1532 12/14/21 1535 12/14/21 1633  BP:  (!) 171/86 (!) 163/93 (!) 145/76  Pulse: 69 67 67 78  Resp: '14 18 18   '$ Temp: (!) 97.3 F (36.3 C) (!) 97.3 F (36.3 C)    TempSrc: Temporal Temporal    SpO2: 96% 93% 100% 98%     Start Time: 1 Hr 17 Min 0 Sec End Time: Incision (Closed) 12/14/21 Back Right-Dressing Type: Transparent dressing, Non adherent Incision (Closed) 12/14/21 Abdomen Right-Dressing Type: Transparent dressing, Non adherent  Dictation: Note written in EPIC Plan of Care: Discharge to 01-Home or Self Care after PACU  Patient Disposition: F/U at Pain Clinic as  outpatient.  Plan of Care  Disposition: Discharge home under the care of a responsible, capable, adult driver. Return to clinics in 6-10 days for post-procedure evaluation and removal of staples.  Discharge (Date  Time): 12/14/2021  4:45 PM  Primary Care Physician: Jearld Fenton, NP Location: [facility] Facility Note by: Gaspar Cola, MD  Disclaimer:  Medicine is not an exact science. The only guarantee in medicine is that nothing is guaranteed. It is important to note that the decision to proceed with this intervention was based on the information collected from the patient. The Data and conclusions were drawn from the patient's questionnaire, the interview, and the physical examination. Because the information was provided in large part by the patient, it cannot be guaranteed that it has not been purposely or unconsciously manipulated. Every effort has been made to obtain as much relevant data as possible for this evaluation. It is important to note that the conclusions that lead to this procedure are derived in large part from the available data. Always take into account that the treatment will also be dependent on availability of resources and existing treatment guidelines, considered by other Pain Management Practitioners as being common knowledge and practice, at the time of the intervention. For Medico-Legal purposes, it is also important to point out that variation in procedural techniques and pharmacological choices are the acceptable norm. The indications, contraindications, technique, and results of the above procedure should only be interpreted and judged by a Board-Certified Interventional Pain Specialist with extensive familiarity and expertise in the same exact procedure and technique.

## 2021-12-14 NOTE — Interval H&P Note (Signed)
Patient's Name: Melinda [name]  MRN: [id number]  Referring Provider: No ref. provider found  DOB: December 27, 1955  PCP: Jearld Fenton, NP  DOS: 11/30/2021  Note by: Gaspar Cola, MD  Service setting: Outpatient Same Day Surgery  Specialty: Interventional Pain Management  Patient type: Established  Location: [facility]  Encounter type: Pre-operative Evaluation   Admission Date & Time: 12/14/2021 10:49 AM  History and Physical Interval Note:  Ms. Tamaka Sawin has presented today for surgery, with the diagnosis of : Malfunction of intrathecal infusion pump.  I have reviewed the patient's chart, labs, images, history and physical exam. No new additional findings. No change in status. Ms. Howeth is stable for surgery. The various alternatives of treatment have been discussed with the patient and family. The patient has been informed of the risks and possible complications. After consideration of risks, benefits and other options for treatment, the patient has consented to Procedure(s): INTRATHECAL PUMP REMOVAL: 03546 (CPT), as a surgical intervention . All questions have been answered to the patient's satisfaction.  Plan: Proceed with surgery as planned.  OR Scheduled Time: 1211  Note by: Gaspar Cola, MD Date: 11/30/2021; Time: 12:58 PM

## 2021-12-15 ENCOUNTER — Encounter: Payer: Self-pay | Admitting: Pain Medicine

## 2021-12-16 NOTE — Anesthesia Postprocedure Evaluation (Signed)
Anesthesia Post Note  Patient: Melinda [name]  Procedure(s) Performed: INTRATHECAL PUMP REMOVAL (Right)  Patient location during evaluation: PACU Anesthesia Type: General Level of consciousness: awake and alert Pain management: pain level controlled Vital Signs Assessment: post-procedure vital signs reviewed and stable Respiratory status: spontaneous breathing, nonlabored ventilation, respiratory function stable and patient connected to nasal cannula oxygen Cardiovascular status: blood pressure returned to baseline and stable Postop Assessment: no apparent nausea or vomiting Anesthetic complications: no   No notable events documented.   Last Vitals:  Vitals:   12/14/21 1535 12/14/21 1633  BP: (!) 163/93 (!) 145/76  Pulse: 67 78  Resp: 18   Temp:    SpO2: 100% 98%    Last Pain:  Vitals:   12/14/21 1532  TempSrc: Temporal  PainSc:                  Martha Clan

## 2021-12-19 ENCOUNTER — Encounter: Payer: Medicare Other | Admitting: Pain Medicine

## 2021-12-22 ENCOUNTER — Other Ambulatory Visit: Payer: Self-pay | Admitting: Internal Medicine

## 2021-12-22 DIAGNOSIS — M797 Fibromyalgia: Secondary | ICD-10-CM

## 2021-12-22 NOTE — Telephone Encounter (Signed)
Requested medication (s) are due for refill today: yes  Requested medication (s) are on the active medication list: yes  Last refill:  06/22/21 #270/1  Future visit scheduled: no  Notes to clinic:  Unable to refill per protocol, cannot delegate.    Requested Prescriptions  Pending Prescriptions Disp Refills   pregabalin (LYRICA) 150 MG capsule [Pharmacy Med Name: PREGABALIN 150 MG CAPSULE] 270 capsule     Sig: TAKE 1 CAPSULE BY MOUTH 3 TIMES DAILY.     Not Delegated - Neurology:  Anticonvulsants - Controlled - pregabalin Failed - 12/22/2021 11:28 AM      Failed - This refill cannot be delegated      Failed - Cr in normal range and within 360 days    Creat  Date Value Ref Range Status  06/22/2021 1.04 0.50 - 1.05 mg/dL Final   Creatinine, Ser  Date Value Ref Range Status  10/23/2021 1.29 (H) 0.57 - 1.00 mg/dL Final   Creatinine,U  Date Value Ref Range Status  03/12/2019 131.0 mg/dL Final   Creatinine, Urine  Date Value Ref Range Status  12/21/2020 137 20 - 275 mg/dL Final         Passed - Completed PHQ-2 or PHQ-9 in the last 360 days      Passed - Valid encounter within last 12 months    Recent Outpatient Visits           3 months ago Preop general physical exam   [facility], Oswaldo Con[place], [place]   3 months ago Orthostasis   [facility] [place], [place] W, NP   6 months ago Type 2 diabetes mellitus with diabetic mononeuropathy, without long-term current use of insulin [facility])   [facility] [place], Salvadore Oxford, NP   6 months ago Stuffy and runny nose   [facility] [place], [place] W, NP   1 year ago Type 2 diabetes mellitus with diabetic mononeuropathy, without long-term current use of insulin (HCC)   [facility], Salvadore Oxford, NP       Future Appointments             In 1 month Skains, Veverly Fells, MD [facility] Office, [facility]

## 2021-12-26 ENCOUNTER — Other Ambulatory Visit: Payer: Self-pay | Admitting: Pain Medicine

## 2021-12-26 ENCOUNTER — Encounter: Payer: Self-pay | Admitting: Pain Medicine

## 2021-12-26 ENCOUNTER — Ambulatory Visit: Payer: Medicare Other | Attending: Pain Medicine | Admitting: Pain Medicine

## 2021-12-26 VITALS — BP 160/82 | HR 77 | Temp 97.2°F | Resp 18 | Ht 69.0 in | Wt 205.0 lb

## 2021-12-26 DIAGNOSIS — M25551 Pain in right hip: Secondary | ICD-10-CM | POA: Diagnosis not present

## 2021-12-26 DIAGNOSIS — M542 Cervicalgia: Secondary | ICD-10-CM

## 2021-12-26 DIAGNOSIS — T85610S Breakdown (mechanical) of epidural and subdural infusion catheter, sequela: Secondary | ICD-10-CM | POA: Diagnosis not present

## 2021-12-26 DIAGNOSIS — M961 Postlaminectomy syndrome, not elsewhere classified: Secondary | ICD-10-CM

## 2021-12-26 DIAGNOSIS — M51379 Other intervertebral disc degeneration, lumbosacral region without mention of lumbar back pain or lower extremity pain: Secondary | ICD-10-CM

## 2021-12-26 DIAGNOSIS — G96198 Other disorders of meninges, not elsewhere classified: Secondary | ICD-10-CM

## 2021-12-26 DIAGNOSIS — G8929 Other chronic pain: Secondary | ICD-10-CM

## 2021-12-26 DIAGNOSIS — Z09 Encounter for follow-up examination after completed treatment for conditions other than malignant neoplasm: Secondary | ICD-10-CM | POA: Diagnosis not present

## 2021-12-26 DIAGNOSIS — Z79891 Long term (current) use of opiate analgesic: Secondary | ICD-10-CM

## 2021-12-26 DIAGNOSIS — M533 Sacrococcygeal disorders, not elsewhere classified: Secondary | ICD-10-CM | POA: Diagnosis not present

## 2021-12-26 DIAGNOSIS — M25552 Pain in left hip: Secondary | ICD-10-CM | POA: Diagnosis not present

## 2021-12-26 DIAGNOSIS — F1290 Cannabis use, unspecified, uncomplicated: Secondary | ICD-10-CM | POA: Diagnosis present

## 2021-12-26 DIAGNOSIS — F1190 Opioid use, unspecified, uncomplicated: Secondary | ICD-10-CM | POA: Diagnosis present

## 2021-12-26 DIAGNOSIS — Z4802 Encounter for removal of sutures: Secondary | ICD-10-CM | POA: Diagnosis not present

## 2021-12-26 DIAGNOSIS — M5441 Lumbago with sciatica, right side: Secondary | ICD-10-CM

## 2021-12-26 DIAGNOSIS — M47816 Spondylosis without myelopathy or radiculopathy, lumbar region: Secondary | ICD-10-CM | POA: Diagnosis not present

## 2021-12-26 DIAGNOSIS — Z79899 Other long term (current) drug therapy: Secondary | ICD-10-CM | POA: Diagnosis not present

## 2021-12-26 DIAGNOSIS — M5137 Other intervertebral disc degeneration, lumbosacral region: Secondary | ICD-10-CM | POA: Diagnosis not present

## 2021-12-26 DIAGNOSIS — G894 Chronic pain syndrome: Secondary | ICD-10-CM

## 2021-12-26 DIAGNOSIS — Z9889 Other specified postprocedural states: Secondary | ICD-10-CM | POA: Diagnosis not present

## 2021-12-26 DIAGNOSIS — S301XXS Contusion of abdominal wall, sequela: Secondary | ICD-10-CM

## 2021-12-26 NOTE — Progress Notes (Signed)
Nursing Pain Medication Assessment:  Safety precautions to be maintained throughout the outpatient stay will include: orient to surroundings, keep bed in low position, maintain call bell within reach at all times, provide assistance with transfer out of bed and ambulation.  Medication Inspection Compliance: Pill count conducted under aseptic conditions, in front of the patient. Neither the pills nor the bottle was removed from the patient's sight at any time. Once count was completed pills were immediately returned to the patient in their original bottle.  Medication: Oxycodone/APAP Pill/Patch Count:  12 of 90 pills remain Pill/Patch Appearance: Markings consistent with prescribed medication Bottle Appearance: Standard pharmacy container. Clearly labeled. Filled Date: 05 / 30 / 2023 Last Medication intake:  Today  Staples removed via abd. And back. Steri strips placed. Instructions given not to pull off stripes to let them come off. Patient with understanding. MD in to eval incision

## 2021-12-27 LAB — MED LIST OPTION NOT SELECTED

## 2021-12-29 LAB — SPECIMEN STATUS REPORT

## 2021-12-29 LAB — TOXASSURE SELECT 13 (MW), URINE

## 2022-01-03 ENCOUNTER — Ambulatory Visit (INDEPENDENT_AMBULATORY_CARE_PROVIDER_SITE_OTHER): Payer: Medicare Other | Admitting: Internal Medicine

## 2022-01-03 ENCOUNTER — Encounter: Payer: Self-pay | Admitting: Internal Medicine

## 2022-01-03 VITALS — BP 116/64 | HR 73 | Temp 96.8°F | Wt 207.0 lb

## 2022-01-03 DIAGNOSIS — K5903 Drug induced constipation: Secondary | ICD-10-CM

## 2022-01-03 DIAGNOSIS — K644 Residual hemorrhoidal skin tags: Secondary | ICD-10-CM | POA: Diagnosis not present

## 2022-01-03 DIAGNOSIS — R194 Change in bowel habit: Secondary | ICD-10-CM | POA: Diagnosis not present

## 2022-01-03 DIAGNOSIS — K921 Melena: Secondary | ICD-10-CM | POA: Diagnosis not present

## 2022-01-03 DIAGNOSIS — T402X5A Adverse effect of other opioids, initial encounter: Secondary | ICD-10-CM

## 2022-01-03 NOTE — Patient Instructions (Signed)
Constipation, Adult Constipation is when a person has trouble pooping (having a bowel movement). When you have this condition, you may poop fewer than 3 times a week. Your poop (stool) may also be dry, hard, or bigger than normal. Follow these instructions at home: Eating and drinking  Eat foods that have a lot of fiber, such as: Fresh fruits and vegetables. Whole grains. Beans. Eat less of foods that are low in fiber and high in fat and sugar, such as: French fries. Hamburgers. Cookies. Candy. Soda. Drink enough fluid to keep your pee (urine) pale yellow. General instructions Exercise regularly or as told by your doctor. Try to do 150 minutes of exercise each week. Go to the restroom when you feel like you need to poop. Do not hold it in. Take over-the-counter and prescription medicines only as told by your doctor. These include any fiber supplements. When you poop: Do deep breathing while relaxing your lower belly (abdomen). Relax your pelvic floor. The pelvic floor is a group of muscles that support the rectum, bladder, and intestines (as well as the uterus in women). Watch your condition for any changes. Tell your doctor if you notice any. Keep all follow-up visits as told by your doctor. This is important. Contact a doctor if: You have pain that gets worse. You have a fever. You have not pooped for 4 days. You vomit. You are not hungry. You lose weight. You are bleeding from the opening of the butt (anus). You have thin, pencil-like poop. Get help right away if: You have a fever, and your symptoms suddenly get worse. You leak poop or have blood in your poop. Your belly feels hard or bigger than normal (bloated). You have very bad belly pain. You feel dizzy or you faint. Summary Constipation is when a person poops fewer than 3 times a week, has trouble pooping, or has poop that is dry, hard, or bigger than normal. Eat foods that have a lot of fiber. Drink enough fluid  to keep your pee (urine) pale yellow. Take over-the-counter and prescription medicines only as told by your doctor. These include any fiber supplements. This information is not intended to replace advice given to you by your health care provider. Make sure you discuss any questions you have with your health care provider. Document Revised: 05/06/2019 Document Reviewed: 05/06/2019 Elsevier Patient Education  2023 Elsevier Inc.  

## 2022-01-03 NOTE — Progress Notes (Signed)
Subjective:    Patient ID: Melinda [name], female    DOB: 12-14-1955, 66 y.o.   MRN: [id number]  HPI  Patient presents to clinic today with complaint of a change in her bowel habits over the last several months.  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 She reports some fecal incontinence. She reports is is not full fecal continence. She had her intrathecal pain pump removed 12/14/21. She is no longer on the Fentanyl so she cut her Mirilax to every other day with some improvement in symptoms. There is no colonoscopy on file.  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 b.) Doppler 06/28/2015: 40-59% BILATERAL ICA. c.) Dopplers 04/23/2019, 04/22/2020, 10/20/2021: 56-38% RICA and 7-56% LICA.   Blue toes    2nd toe on right foot, will get appt.   Bulging disc    CAD (coronary artery disease) 2009   a.) PCI with placement of a 15 x 30 mm Promus DES to mLAD. b.) LHC 02/27/2011: EF 60%; no sig CAD; mild lum irreg at ostium of diagnoal (jailed by stent).   Chicken pox    Chronic fatigue    Chronic, continuous use of opioids    CKD (chronic kidney disease), stage III (HCC)    Degenerative disc disease    Depression    Diverticulosis    Facet joint disease    Failed cervical fusion    Fibromyalgia    Hiatal hernia    Hyperlipidemia    IBS (irritable bowel syndrome)    MCL deficiency, knee    MRSA (methicillin resistant staph aureus) culture positive 2011   GREAT TOE RIGHT FOOT   Neuropathy 04/18/2010   Orthostatic hypotension 01/2020   a.) multiple pre-syncopal episodes; takes proamatine   OSA on CPAP 08/17/2003    Osteoarthritis    a.) back, neck, hands, knees   Restless leg syndrome    Sepsis ([place]) 09/2013   Spinal headache 11/07/2021   Spinal stenosis    T2DM (type 2 diabetes mellitus) ([place])    Wolff-Parkinson-White (WPW) syndrome 1992   a.) s/p ablations; unsuccessful. Now has rare episodes    Current Outpatient Medications  Medication Sig Dispense Refill   ACCU-CHEK GUIDE test strip USE 1 EACH BY OTHER ROUTE 3 (THREE) TIMES DAILY. USE AS INSTRUCTE 200 strip 1   Accu-Chek Softclix Lancets lancets USE TO CHECK BLOOD SUGAR 1 TIME DAILY 100 each 2   AMBULATORY NON FORMULARY MEDICATION Medication Name: CPAP MASK OF CHOICE FOR HOME DEVICE 1 each 0   Ascorbic Acid (VITAMIN C) 1000 MG tablet Take 1,000 mg by mouth daily.     ASPIRIN 81 PO Take 81 mg by mouth daily.     Biotin 10000 MCG TABS Take 10,000 mcg by mouth daily.     Blood Glucose Calibration (ACCU-CHEK GUIDE CONTROL) LIQD 1 each by In Vitro route as needed. 1 each 1   buPROPion (WELLBUTRIN XL) 150 MG 24 hr tablet Take 1 tablet (150 mg total) by mouth daily. (Patient taking differently: Take 150 mg by mouth every morning.) 90 tablet 1   CALCIUM-MAGNESIUM-ZINC PO Take 3 capsules by  mouth daily.     CINNAMON PO Take 1,000 mg by mouth daily.     Coenzyme Q10 (CO Q 10 PO) Take 1 tablet by mouth daily.     Elastic Bandages & Supports (ABDOMINAL BINDER/ELASTIC LARGE) MISC Abdominal binder 1 each 0   ezetimibe (ZETIA) 10 MG tablet TAKE 1 TABLET BY MOUTH EVERY DAY 90 tablet 0   fludrocortisone (FLORINEF) 0.1 MG tablet Take 1 tablet (0.1 mg total) by mouth daily. (Patient taking differently: Take 0.1 mg by mouth every morning.) 90 tablet 3   glucosamine-chondroitin 500-400 MG tablet Take 2 tablets by mouth daily.      Insulin Pen Needle 31G X 5 MM MISC BD Pen Needles- brand specific Inject insulin via insulin pen 6 x daily 100 each 2   Magnesium 400 MG CAPS Take 400 mg by mouth at bedtime.     midodrine (PROAMATINE) 5 MG tablet Take 1 tablet (5 mg  total) by mouth 2 (two) times daily with a meal. 180 tablet 0   nitroGLYCERIN (NITROSTAT) 0.4 MG SL tablet PLACE 1 TABLET UNDER THE TONGUE EVERY 5 (FIVE) MINUTES AS NEEDED FOR CHEST PAIN. 25 tablet 4   NONFORMULARY OR COMPOUNDED ITEM 212.8 mcg by Epidural Infusion route. Medtronic Neuromodulation pump  Fentanyl 1,000.0 mcg/ml Baclofen 240.0 mcg/ml Bupivacaine 20.0 mg/ml Total daily dose 269.6 mcg/day (Patient not taking: Reported on 12/26/2021)     Omega-3 Fatty Acids (FISH OIL) 1200 MG CAPS Take 1,200 mg by mouth in the morning.      oxyCODONE-acetaminophen (PERCOCET) 5-325 MG tablet Take 1 tablet by mouth every 8 (eight) hours as needed for severe pain. Each refill must last 30 days. 90 tablet 0   [START ON 01/24/2022] oxyCODONE-acetaminophen (PERCOCET) 5-325 MG tablet Take 1 tablet by mouth every 8 (eight) hours as needed for severe pain. Each refill must last 30 days. 90 tablet 0   oxyCODONE-acetaminophen (PERCOCET) 5-325 MG tablet Take 1 tablet by mouth every 6 (six) hours as needed for up to 7 days for severe pain. Must last 7 days. 28 tablet 0   OZEMPIC, 0.25 OR 0.5 MG/DOSE, 2 MG/3ML SOPN Inject 0.5 mg into the skin once a week. (Patient taking differently: Inject 0.5 mg into the skin once a week. Wednesday) 9 mL 0   PARoxetine (PAXIL) 20 MG tablet TAKE 1/2 TABLET BY MOUTH DAILY IN THE MORNING 45 tablet 0   polyethylene glycol (MIRALAX / GLYCOLAX) packet Take 17 g by mouth daily.     pregabalin (LYRICA) 150 MG capsule TAKE 1 CAPSULE BY MOUTH 3 TIMES DAILY. 270 capsule 0   rosuvastatin (CRESTOR) 20 MG tablet TAKE 1 TABLET BY MOUTH EVERY DAY (Patient taking differently: Take 20 mg by mouth every evening.) 90 tablet 3   TART CHERRY PO Take 1 tablet by mouth daily at 6 (six) AM.     tiZANidine (ZANAFLEX) 4 MG tablet TAKE 1 TABLET (4 MG TOTAL) BY MOUTH EVERY 8 (EIGHT) HOURS AS NEEDED FOR MUSCLE SPASMS (Patient taking differently: Take 4 mg by mouth 3 (three) times daily.) 270 tablet 0   vitamin E  180 MG (400 UNITS) capsule Take 400 Units by mouth daily.     No current facility-administered medications for this visit.    No Known Allergies  Family History  Problem Relation Age of Onset   Cancer Mother    Lung cancer Mother    Arthritis Father    Heart disease Father    Diabetes Father    Dementia Maternal  Grandmother    Alcohol abuse Paternal Grandfather    Heart disease Paternal Grandfather    Stroke Paternal Grandfather     Social History   Socioeconomic History   Marital status: Significant Other    Spouse name: Not on file   Number of children: Not on file   Years of education: Not on file   Highest education level: Not on file  Occupational History   Not on file  Tobacco Use   Smoking status: Former    Packs/day: 1.50    Years: 32.00    Total pack years: 48.00    Types: Cigarettes    Quit date: 07/22/2007    Years since quitting: 14.4   Smokeless tobacco: Never  Vaping Use   Vaping Use: Never used  Substance and Sexual Activity   Alcohol use: Yes    Comment: occ - Holidays   Drug use: Never    Comment: prescribed pain pump and oxy   Sexual activity: Yes  Other Topics Concern   Not on file  Social History Narrative   Lives at home with a partner. Independent at baseline   Social Determinants of Health   Financial Resource Strain: Low Risk  (10/05/2021)   Overall Financial Resource Strain (CARDIA)    Difficulty of Paying Living Expenses: Not hard at all  Food Insecurity: No Food Insecurity (10/05/2021)   Hunger Vital Sign    Worried About Running Out of Food in the Last Year: Never true    Ran Out of Food in the Last Year: Never true  Transportation Needs: No Transportation Needs (10/05/2021)   PRAPARE - Hydrologist (Medical): No    Lack of Transportation (Non-Medical): No  Physical Activity: Inactive (10/05/2021)   Exercise Vital Sign    Days of Exercise per Week: 0 days    Minutes of Exercise per Session: 0 min   Stress: Not on file  Social Connections: Moderately Isolated (10/05/2021)   Social Connection and Isolation Panel [NHANES]    Frequency of Communication with Friends and Family: More than three times a week    Frequency of Social Gatherings with Friends and Family: Never    Attends Religious Services: Never    Marine scientist or Organizations: No    Attends Music therapist: Never    Marital Status: Living with partner  Intimate Partner Violence: Not on file     Constitutional: Denies fever, malaise, fatigue, headache or abrupt weight changes.  Respiratory: Denies difficulty breathing, shortness of breath, cough or sputum production.   Cardiovascular: Denies chest pain, chest tightness, palpitations or swelling in the hands or feet.  Gastrointestinal: Pt reports fecal incontinence, blood in stool. Denies abdominal pain, bloating, constipation, diarrhea or blood in the stool.  Skin: Denies redness, rashes, lesions or ulcercations.    No other specific complaints in a complete review of systems (except as listed in HPI above).  Objective:   Physical Exam  BP 116/64 (BP Location: Right Arm, Patient Position: Sitting, Cuff Size: Normal)   Pulse 73   Temp (!) 96.8 F (36 C) (Temporal)   Wt 207 lb (93.9 kg)   LMP  (LMP Unknown)   SpO2 97%   BMI 30.57 kg/m   Wt Readings from Last 3 Encounters:  12/26/21 205 lb (93 kg)  12/07/21 205 lb (93 kg)  11/07/21 205 lb (93 kg)    General: Appears her stated age, obese, in NAD. Skin: Warm, dry and intact.  Cardiovascular: Normal rate and rhythm. S1,S2 noted.  No murmur, rubs or gallops noted.  Pulmonary/Chest: Normal effort and positive vesicular breath sounds. No respiratory distress. No wheezes, rales or ronchi noted.  Abdomen: Soft and nontender. Normal bowel sounds. No distention or masses noted.  Rectal: External hemorrhoids noted. Normal rectal tone. Hemoccult negative stool. Neurological: Alert and oriented.    BMET    Component Value Date/Time   NA 139 10/23/2021 1552   NA 135 (L) 06/30/2013 1203   K 4.8 10/23/2021 1552   K 4.1 06/30/2013 1203   CL 99 10/23/2021 1552   CL 102 06/30/2013 1203   CO2 31 06/22/2021 1450   CO2 32 06/30/2013 1203   GLUCOSE 61 (L) 10/23/2021 1552   GLUCOSE 85 06/22/2021 1450   GLUCOSE 143 (H) 06/30/2013 1203   BUN 21 10/23/2021 1552   BUN 19 (H) 06/30/2013 1203   CREATININE 1.29 (H) 10/23/2021 1552   CREATININE 1.04 06/22/2021 1450   CALCIUM 9.8 10/23/2021 1552   CALCIUM 9.7 06/30/2013 1203   GFRNONAA 56 (L) 12/21/2020 0957   GFRAA 65 12/21/2020 0957    Lipid Panel     Component Value Date/Time   CHOL 98 06/22/2021 1450   TRIG 114 06/22/2021 1450   HDL 39 (L) 06/22/2021 1450   CHOLHDL 2.5 06/22/2021 1450   VLDL 31.6 03/12/2019 1255   LDLCALC 39 06/22/2021 1450    CBC    Component Value Date/Time   WBC 5.7 12/11/2021 1400   RBC 4.39 12/11/2021 1400   HGB 12.3 12/11/2021 1400   HGB 14.0 11/12/2011 1136   HCT 38.1 12/11/2021 1400   HCT 40.7 11/12/2011 1136   PLT 184 12/11/2021 1400   PLT 274 11/12/2011 1136   MCV 86.8 12/11/2021 1400   MCV 86 11/12/2011 1136   MCH 28.0 12/11/2021 1400   MCHC 32.3 12/11/2021 1400   RDW 11.9 12/11/2021 1400   RDW 12.8 11/12/2011 1136   LYMPHSABS 1.4 09/06/2020 1330   LYMPHSABS 2.5 11/12/2011 1136   MONOABS 0.5 09/06/2020 1330   MONOABS 0.8 11/12/2011 1136   EOSABS 0.2 09/06/2020 1330   EOSABS 0.6 11/12/2011 1136   BASOSABS 0.1 09/06/2020 1330   BASOSABS 0.1 11/12/2011 1136    Hgb A1C Lab Results  Component Value Date   HGBA1C 5.6 06/22/2021            Assessment & Plan:   Change in Bowel Habits, Blood in Stool:  Likely looser because she is no longer on Fentanyl Decreased Mirilax 17 gm to every 3 days I don't think this is infectious, so will hold off on getting a stool sample at this time Would consider referral for colonsocopy if symptoms persist  Make an appt for your annual  exam [name], NP

## 2022-01-12 ENCOUNTER — Other Ambulatory Visit: Payer: Self-pay | Admitting: Internal Medicine

## 2022-01-12 NOTE — Telephone Encounter (Signed)
Requested Prescriptions  Pending Prescriptions Disp Refills  . PARoxetine (PAXIL) 20 MG tablet [Pharmacy Med Name: PAROXETINE HCL 20 MG TABLET] 45 tablet 0    Sig: TAKE 1/2 TABLET BY MOUTH DAILY IN THE MORNING     Psychiatry:  Antidepressants - SSRI Passed - 01/12/2022  2:28 AM      Passed - Completed PHQ-2 or PHQ-9 in the last 360 days      Passed - Valid encounter within last 6 months    Recent Outpatient Visits          1 week ago Opioid-induced constipation (OIC)   [facility], Coralie Keens, NP   3 months ago Preop general physical exam   [place], [place]   4 months ago [facility] [place], Coralie Keens, NP   6 months ago Type 2 diabetes mellitus with diabetic mononeuropathy, without long-term current use of insulin [facility])   [facility], Coralie Keens, NP   7 months ago Stuffy and runny nose   [facility], Coralie Keens, NP      Future Appointments            In 1 week Marlou Porch, Thana Farr, MD [place], [place]   In 1 week Miranda, Coralie Keens, NP [facility], [place]

## 2022-01-18 ENCOUNTER — Encounter: Payer: Self-pay | Admitting: Pain Medicine

## 2022-01-22 ENCOUNTER — Ambulatory Visit (INDEPENDENT_AMBULATORY_CARE_PROVIDER_SITE_OTHER): Payer: Medicare Other | Admitting: Cardiology

## 2022-01-22 ENCOUNTER — Encounter: Payer: Self-pay | Admitting: Cardiology

## 2022-01-22 VITALS — BP 120/60 | HR 64 | Ht 69.0 in | Wt 210.0 lb

## 2022-01-22 DIAGNOSIS — I2510 Atherosclerotic heart disease of native coronary artery without angina pectoris: Secondary | ICD-10-CM

## 2022-01-22 DIAGNOSIS — E7800 Pure hypercholesterolemia, unspecified: Secondary | ICD-10-CM

## 2022-01-22 DIAGNOSIS — I951 Orthostatic hypotension: Secondary | ICD-10-CM | POA: Diagnosis not present

## 2022-01-22 MED ORDER — MIDODRINE HCL 5 MG PO TABS: 5.0000 mg | ORAL_TABLET | Freq: Three times a day (TID) | ORAL | 3 refills | Status: DC

## 2022-01-22 NOTE — Progress Notes (Signed)
Cardiology Office Note:    Date:  01/22/2022   ID:  [name], DOB 1955-10-22, MRN [id number]  PCP:  Jearld Fenton, NP   [facility] Providers Cardiologist:  Candee Furbish, MD     Referring MD: Jearld Fenton, NP    History of Present Illness:    Melinda [name] is a 66 y.o. female here for review of hypotension. Kitchen had to lay over counter to pass. 80/49 when took it. Midodrine 5 BID And Florinef.   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 b.) Doppler 06/28/2015: 40-59% BILATERAL ICA. c.) Dopplers 04/23/2019, 04/22/2020, 10/20/2021: 67-61% RICA and 9-50% LICA.   Blue toes    2nd toe on right foot, will get appt.   Bulging disc    CAD (coronary artery disease) 2009   a.) PCI with placement of a 15 x 30 mm Promus DES to mLAD. b.) LHC 02/27/2011: EF 60%; no sig CAD; mild lum irreg at ostium of diagnoal (jailed by stent).   Chicken pox    Chronic fatigue    Chronic, continuous use of opioids    CKD (chronic kidney disease), stage III (HCC)    Degenerative disc disease    Depression    Diverticulosis    Facet joint disease    Failed cervical fusion    Fibromyalgia    Hiatal hernia    Hyperlipidemia    IBS (irritable bowel syndrome)    MCL deficiency, knee    MRSA (methicillin resistant staph aureus) culture positive 2011   GREAT TOE RIGHT FOOT   Neuropathy 04/18/2010   Orthostatic hypotension 01/2020   a.) multiple pre-syncopal episodes; takes proamatine   OSA on CPAP 08/17/2003   Osteoarthritis    a.) back, neck, hands, knees   Restless leg syndrome    Sepsis ([place]) 09/2013   Spinal headache  11/07/2021   Spinal stenosis    T2DM (type 2 diabetes mellitus) ([place])    Wolff-Parkinson-White (WPW) syndrome 1992   a.) s/p ablations; unsuccessful. Now has rare episodes    Past Surgical History:  Procedure Laterality Date   AMPUTATION TOE Right 02/01/2017   Procedure: AMPUTATION TOE-RIGHT 2ND MPJ;  Surgeon: Albertine Patricia, DPM;  Location: [facility];  Service: Podiatry;  Laterality: Right;   ANTERIOR CERVICAL DECOMP/DISCECTOMY FUSION N/A 07/28/2014   Procedure: ANTERIOR CERVICAL DECOMPRESSION/DISCECTOMY FUSION CERVICAL 3-4,4-5,5-6 LEVELS WITH INSTRUMENTATION AND ALLOGRAFT;  Surgeon: Sinclair Ship, MD;  Location: [place];  Service: Orthopedics;  Laterality: N/A;  Anterior cervical decompression fusion, cervical 3-4, cervical 4-5, cervical 5-6 with instrumentation and allograft   BACK SURGERY     X 3 1979, 1994, 1995   BLEPHAROPLASTY Bilateral 2013   CARDIAC ELECTROPHYSIOLOGY STUDY AND ABLATION N/A 1992   CARPAL TUNNEL RELEASE Right    CHOLECYSTECTOMY  2003   CORONARY ANGIOPLASTY WITH STENT PLACEMENT N/A 2009   Procedure: CORONARY ANGIOPLASTY WITH STENT PLACEMENT   ENDOMETRIAL ABLATION  2007   EXCISION PARTIAL PHALANX Left 09/28/2021   Procedure: EXCISION PARTIAL PHALANX - FIRST;  Surgeon: Caroline More, DPM;  Location: [place];  Service: Podiatry;  Laterality: Left;  GALLBLADDER SURGERY     HAMMER TOE SURGERY Right 10/17/2017   Procedure: HAMMER TOE CORRECTION-4TH TOE;  Surgeon: Albertine Patricia, DPM;  Location: [place];  Service: Podiatry;  Laterality: Right;  LMA- WITH LOCAL Diabetic - diet controlled   HAND SURGERY Left    INFUSION PUMP IMPLANTATION     X2 with morphine and baclofen   INTRATHECAL PUMP REVISION N/A 04/25/2018   Procedure: Intrathecal pump replacement;  Surgeon: Clydell Hakim, MD;  Location: [place];  Service: Neurosurgery;  Laterality: N/A;  right   INTRATHECAL PUMP REVISION Right 04/25/2018   INTRATHECAL PUMP REVISION N/A 12/12/2018    Procedure: Intrathecal pump revision with exploration of pocket;  Surgeon: Clydell Hakim, MD;  Location: [place];  Service: Neurosurgery;  Laterality: N/A;  Intrathecal pump revision with exploration of pocket   INTRATHECAL PUMP REVISION N/A 03/11/2020   Procedure: INTRATHECAL PUMP REVISION;  Surgeon: [name], MD;  Location: [facility];  Service: Neurosurgery;  Laterality: N/A;   INTRATHECAL PUMP REVISION Right 12/14/2021   Procedure: INTRATHECAL PUMP REMOVAL;  Surgeon: [name], MD;  Location: [facility];  Service: Neurosurgery;  Laterality: Right;   IRRIGATION AND DEBRIDEMENT FOOT Right 02/23/2017   Procedure: IRRIGATION AND DEBRIDEMENT FOOT-right foot;  Surgeon: Samara Deist, DPM;  Location: [facility];  Service: Podiatry;  Laterality: Right;   KNEE SURGERY Right 05/24/2020   LEFT HEART CATH AND CORONARY ANGIOGRAPHY Left 02/27/2011   Procedure: LEFT HEART CATH AND CORONARY ANGIOGRAPHY   PAIN PUMP REVISION N/A 08/15/2018   Procedure: Intrathecal PUMP REVISION;  Surgeon: Clydell Hakim, MD;  Location: [place];  Service: Neurosurgery;  Laterality: N/A;  INTRATHECAL PUMP REVISION   PLANTAR FASCIA SURGERY Bilateral    TOE AMPUTATION Right    RIGHT great toe (1st digit)   TOE AMPUTATION Right    a.) RIGHT second toe (2nd digit)   TOE SURGERY     then revision 8/18   TOTAL KNEE ARTHROPLASTY Right 05/24/2020   Procedure: RIGHT TOTAL KNEE ARTHROPLASTY;  Surgeon: Melrose Nakayama, MD;  Location: [facility];  Service: Orthopedics;  Laterality: Right;   TOTAL KNEE REVISION Right 09/06/2020   : RIGHT KNEE POLY REVISION;  Melrose Nakayama, MD) PLAN TO DISCHARGE FROM PACU   WOUND DEBRIDEMENT Left 09/28/2021   Procedure: DEBRIDEMENT SKIN, SUBCUTANEOUS TISSUE - 16109;  Surgeon: Caroline More, DPM;  Location: [place];  Service: Podiatry;  Laterality: Left;  Diabetic    Current Medications: Current Meds  Medication Sig   ACCU-CHEK GUIDE test strip USE 1 EACH BY OTHER ROUTE 3  (THREE) TIMES DAILY. USE AS INSTRUCTE   Accu-Chek Softclix Lancets lancets USE TO CHECK BLOOD SUGAR 1 TIME DAILY   Ascorbic Acid (VITAMIN C) 1000 MG tablet Take 1,000 mg by mouth daily.   ASPIRIN 81 PO Take 81 mg by mouth daily.   Biotin 10000 MCG TABS Take 10,000 mcg by mouth daily.   Blood Glucose Calibration (ACCU-CHEK GUIDE CONTROL) LIQD 1 each by In Vitro route as needed.   buPROPion (WELLBUTRIN XL) 150 MG 24 hr tablet Take 1 tablet (150 mg total) by mouth daily.   CALCIUM-MAGNESIUM-ZINC PO Take 3 capsules by mouth daily.   CINNAMON PO Take 1,000 mg by mouth daily.   Coenzyme Q10 (CO Q 10 PO) Take 1 tablet by mouth daily.   ezetimibe (ZETIA) 10 MG tablet TAKE 1 TABLET BY MOUTH EVERY DAY   fludrocortisone (FLORINEF) 0.1 MG tablet Take 1 tablet (0.1 mg total) by mouth daily. (Patient taking differently: Take  0.1 mg by mouth every morning.)   glucosamine-chondroitin 500-400 MG tablet Take 2 tablets by mouth daily.    Insulin Pen Needle 31G X 5 MM MISC BD Pen Needles- brand specific Inject insulin via insulin pen 6 x daily   Magnesium 400 MG CAPS Take 400 mg by mouth at bedtime.   midodrine (PROAMATINE) 5 MG tablet Take 1 tablet (5 mg total) by mouth 3 (three) times daily with meals.   nitroGLYCERIN (NITROSTAT) 0.4 MG SL tablet PLACE 1 TABLET UNDER THE TONGUE EVERY 5 (FIVE) MINUTES AS NEEDED FOR CHEST PAIN.   Omega-3 Fatty Acids (FISH OIL) 1200 MG CAPS Take 1,200 mg by mouth in the morning.    oxyCODONE-acetaminophen (PERCOCET) 5-325 MG tablet Take 1 tablet by mouth every 8 (eight) hours as needed for severe pain. Each refill must last 30 days.   [START ON 01/24/2022] oxyCODONE-acetaminophen (PERCOCET) 5-325 MG tablet Take 1 tablet by mouth every 8 (eight) hours as needed for severe pain. Each refill must last 30 days.   OZEMPIC, 0.25 OR 0.5 MG/DOSE, 2 MG/3ML SOPN Inject 0.5 mg into the skin once a week. (Patient taking differently: Inject 0.5 mg into the skin once a week. Wednesday)    PARoxetine (PAXIL) 20 MG tablet TAKE 1/2 TABLET BY MOUTH DAILY IN THE MORNING   polyethylene glycol (MIRALAX / GLYCOLAX) packet Take 17 g by mouth daily.   pregabalin (LYRICA) 150 MG capsule TAKE 1 CAPSULE BY MOUTH 3 TIMES DAILY.   rosuvastatin (CRESTOR) 20 MG tablet TAKE 1 TABLET BY MOUTH EVERY DAY   TART CHERRY PO Take 1 tablet by mouth daily at 6 (six) AM.   tiZANidine (ZANAFLEX) 4 MG tablet TAKE 1 TABLET (4 MG TOTAL) BY MOUTH EVERY 8 (EIGHT) HOURS AS NEEDED FOR MUSCLE SPASMS   vitamin E 180 MG (400 UNITS) capsule Take 400 Units by mouth daily.   [DISCONTINUED] midodrine (PROAMATINE) 5 MG tablet Take 1 tablet (5 mg total) by mouth 2 (two) times daily with a meal.     Allergies:   Patient has no known allergies.   Social History   Socioeconomic History   Marital status: Significant Other    Spouse name: Not on file   Number of children: Not on file   Years of education: Not on file   Highest education level: Not on file  Occupational History   Not on file  Tobacco Use   Smoking status: Former    Packs/day: 1.50    Years: 32.00    Total pack years: 48.00    Types: Cigarettes    Quit date: 07/22/2007    Years since quitting: 14.5   Smokeless tobacco: Never  Vaping Use   Vaping Use: Never used  Substance and Sexual Activity   Alcohol use: Yes    Comment: occ - Holidays   Drug use: Never    Comment: prescribed pain pump and oxy   Sexual activity: Yes  Other Topics Concern   Not on file  Social History Narrative   Lives at home with a partner. Independent at baseline   Social Determinants of Health   Financial Resource Strain: Low Risk  (10/05/2021)   Overall Financial Resource Strain (CARDIA)    Difficulty of Paying Living Expenses: Not hard at all  Food Insecurity: No Food Insecurity (10/05/2021)   Hunger Vital Sign    Worried About Running Out of Food in the Last Year: Never true    Ran Out of Food in the Last Year: Never  true  Transportation Needs: No Transportation  Needs (10/05/2021)   PRAPARE - Hydrologist (Medical): No    Lack of Transportation (Non-Medical): No  Physical Activity: Inactive (10/05/2021)   Exercise Vital Sign    Days of Exercise per Week: 0 days    Minutes of Exercise per Session: 0 min  Stress: Not on file  Social Connections: Moderately Isolated (10/05/2021)   Social Connection and Isolation Panel [NHANES]    Frequency of Communication with Friends and Family: More than three times a week    Frequency of Social Gatherings with Friends and Family: Never    Attends Religious Services: Never    Marine scientist or Organizations: No    Attends Music therapist: Never    Marital Status: Living with partner     Family History: The patient's family history includes Alcohol abuse in her paternal grandfather; Arthritis in her father; Cancer in her mother; Dementia in her maternal grandmother; Diabetes in her father; Heart disease in her father and paternal grandfather; Lung cancer in her mother; Stroke in her paternal grandfather.  ROS:   Please see the history of present illness.     All other systems reviewed and are negative.  EKGs/Labs/Other Studies Reviewed:     Recent Labs: 06/22/2021: ALT 25 10/23/2021: BUN 21; Creatinine, Ser 1.29; Potassium 4.8; Sodium 139 12/11/2021: Hemoglobin 12.3; Platelets 184  Recent Lipid Panel    Component Value Date/Time   CHOL 98 06/22/2021 1450   TRIG 114 06/22/2021 1450   HDL 39 (L) 06/22/2021 1450   CHOLHDL 2.5 06/22/2021 1450   VLDL 31.6 03/12/2019 1255   LDLCALC 39 06/22/2021 1450   LDLDIRECT 63.0 11/04/2017 1326     Risk Assessment/Calculations:              Physical Exam:    VS:  BP 120/60 (BP Location: Left Arm, Patient Position: Sitting, Cuff Size: Normal)   Pulse 64   Ht '5\' 9"'$  (1.753 m)   Wt 210 lb (95.3 kg)   LMP  (LMP Unknown)   SpO2 96%   BMI 31.01 kg/m     Wt Readings from Last 3 Encounters:  01/22/22 210 lb (95.3  kg)  01/03/22 207 lb (93.9 kg)  12/26/21 205 lb (93 kg)     GEN:  Well nourished, well developed in no acute distress HEENT: Normal NECK: No JVD; No carotid bruits LYMPHATICS: No lymphadenopathy CARDIAC: RRR, no murmurs, no rubs, gallops RESPIRATORY:  Clear to auscultation without rales, wheezing or rhonchi  ABDOMEN: Soft, non-tender, non-distended MUSCULOSKELETAL:  No edema; No deformity  SKIN: Warm and dry NEUROLOGIC:  Alert and oriented x 3 PSYCHIATRIC:  Normal affect   ASSESSMENT:    1. Orthostasis   2. Pure hypercholesterolemia   3. Coronary artery disease involving native coronary artery of native heart without angina pectoris    PLAN:    In order of problems listed above:    CAD - DES to LAD 2009 cardiac cath 2012 patent stent stress test 2020 low risk.  Continue with goal-directed medical therapy which includes Crestor 20 Zetia 10 aspirin 81.  Hypotension orthostasis - We are going to increase her midodrine to 5 mg 3 times a day.  Continue Florinef 0.1 mg a day. -Salt and fluid liberalization.  Hypercholesterolemia - Zetia and Crestor with dosing as above.  LDL 39.  Excellent.  Carotid artery stenosis bilateral - Mild to moderate disease.  Continue with statin.  71-month  follow-up to make sure that her blood pressures are sufficiently controlled.       Medication Adjustments/Labs and Tests Ordered: Current medicines are reviewed at length with the patient today.  Concerns regarding medicines are outlined above.  No orders of the defined types were placed in this encounter.  Meds ordered this encounter  Medications   midodrine (PROAMATINE) 5 MG tablet    Sig: Take 1 tablet (5 mg total) by mouth 3 (three) times daily with meals.    Dispense:  270 tablet    Refill:  3    Patient Instructions  Medication Instructions:  Please increase your Midodrine to 5 mg three times a day. Continue all other medications as listed. *If you need a refill on your  cardiac medications before your next appointment, please call your pharmacy*   Follow-Up: At [facility], you and your health needs are our priority.  As part of our continuing mission to provide you with exceptional heart care, we have created designated Provider Care Teams.  These Care Teams include your primary Cardiologist (physician) and Advanced Practice Providers (APPs -  Physician Assistants and Nurse Practitioners) who all work together to provide you with the care you need, when you need it.  We recommend signing up for the patient portal called "MyChart".  Sign up information is provided on this After Visit Summary.  MyChart is used to connect with patients for Virtual Visits (Telemedicine).  Patients are able to view lab/test results, encounter notes, upcoming appointments, etc.  Non-urgent messages can be sent to your provider as well.   To learn more about what you can do with MyChart, go to [email or web address].    Your next appointment:   3 month(s)  The format for your next appointment:   In Person  Provider:   Candee Furbish, MD {   Important Information About Sugar         Signed, Candee Furbish, MD  01/22/2022 3:11 PM    [place]

## 2022-01-22 NOTE — Patient Instructions (Signed)
Medication Instructions:  Please increase your Midodrine to 5 mg three times a day. Continue all other medications as listed. *If you need a refill on your cardiac medications before your next appointment, please call your pharmacy*   Follow-Up: At [facility], you and your health needs are our priority.  As part of our continuing mission to provide you with exceptional heart care, we have created designated Provider Care Teams.  These Care Teams include your primary Cardiologist (physician) and Advanced Practice Providers (APPs -  Physician Assistants and Nurse Practitioners) who all work together to provide you with the care you need, when you need it.  We recommend signing up for the patient portal called "MyChart".  Sign up information is provided on this After Visit Summary.  MyChart is used to connect with patients for Virtual Visits (Telemedicine).  Patients are able to view lab/test results, encounter notes, upcoming appointments, etc.  Non-urgent messages can be sent to your provider as well.   To learn more about what you can do with MyChart, go to [email or web address].    Your next appointment:   3 month(s)  The format for your next appointment:   In Person  Provider:   Candee Furbish, MD {   Important Information About Sugar

## 2022-01-23 ENCOUNTER — Encounter: Payer: Self-pay | Admitting: Internal Medicine

## 2022-01-23 ENCOUNTER — Ambulatory Visit (INDEPENDENT_AMBULATORY_CARE_PROVIDER_SITE_OTHER): Payer: Medicare Other | Admitting: Internal Medicine

## 2022-01-23 VITALS — BP 124/66 | HR 73 | Temp 97.5°F | Ht 69.0 in | Wt 210.0 lb

## 2022-01-23 DIAGNOSIS — Z0001 Encounter for general adult medical examination with abnormal findings: Secondary | ICD-10-CM

## 2022-01-23 DIAGNOSIS — E6609 Other obesity due to excess calories: Secondary | ICD-10-CM

## 2022-01-23 DIAGNOSIS — Z1231 Encounter for screening mammogram for malignant neoplasm of breast: Secondary | ICD-10-CM | POA: Diagnosis not present

## 2022-01-23 DIAGNOSIS — Z6831 Body mass index (BMI) 31.0-31.9, adult: Secondary | ICD-10-CM

## 2022-01-23 DIAGNOSIS — Z1211 Encounter for screening for malignant neoplasm of colon: Secondary | ICD-10-CM

## 2022-01-23 DIAGNOSIS — E1141 Type 2 diabetes mellitus with diabetic mononeuropathy: Secondary | ICD-10-CM

## 2022-01-23 DIAGNOSIS — Z780 Asymptomatic menopausal state: Secondary | ICD-10-CM

## 2022-01-23 NOTE — Assessment & Plan Note (Signed)
Encourage diet and exercise for weight loss 

## 2022-01-23 NOTE — Patient Instructions (Signed)
Health Maintenance for Postmenopausal Women Menopause is a normal process in which your ability to get pregnant comes to an end. This process happens slowly over many months or years, usually between the ages of 48 and 55. Menopause is complete when you have missed your menstrual period for 12 months. It is important to talk with your health care provider about some of the most common conditions that affect women after menopause (postmenopausal women). These include heart disease, cancer, and bone loss (osteoporosis). Adopting a healthy lifestyle and getting preventive care can help to promote your health and wellness. The actions you take can also lower your chances of developing some of these common conditions. What are the signs and symptoms of menopause? During menopause, you may have the following symptoms: Hot flashes. These can be moderate or severe. Night sweats. Decrease in sex drive. Mood swings. Headaches. Tiredness (fatigue). Irritability. Memory problems. Problems falling asleep or staying asleep. Talk with your health care provider about treatment options for your symptoms. Do I need hormone replacement therapy? Hormone replacement therapy is effective in treating symptoms that are caused by menopause, such as hot flashes and night sweats. Hormone replacement carries certain risks, especially as you become older. If you are thinking about using estrogen or estrogen with progestin, discuss the benefits and risks with your health care provider. How can I reduce my risk for heart disease and stroke? The risk of heart disease, heart attack, and stroke increases as you age. One of the causes may be a change in the body's hormones during menopause. This can affect how your body uses dietary fats, triglycerides, and cholesterol. Heart attack and stroke are medical emergencies. There are many things that you can do to help prevent heart disease and stroke. Watch your blood pressure High  blood pressure causes heart disease and increases the risk of stroke. This is more likely to develop in people who have high blood pressure readings or are overweight. Have your blood pressure checked: Every 3-5 years if you are 18-39 years of age. Every year if you are 40 years old or older. Eat a healthy diet  Eat a diet that includes plenty of vegetables, fruits, low-fat dairy products, and lean protein. Do not eat a lot of foods that are high in solid fats, added sugars, or sodium. Get regular exercise Get regular exercise. This is one of the most important things you can do for your health. Most adults should: Try to exercise for at least 150 minutes each week. The exercise should increase your heart rate and make you sweat (moderate-intensity exercise). Try to do strengthening exercises at least twice each week. Do these in addition to the moderate-intensity exercise. Spend less time sitting. Even light physical activity can be beneficial. Other tips Work with your health care provider to achieve or maintain a healthy weight. Do not use any products that contain nicotine or tobacco. These products include cigarettes, chewing tobacco, and vaping devices, such as e-cigarettes. If you need help quitting, ask your health care provider. Know your numbers. Ask your health care provider to check your cholesterol and your blood sugar (glucose). Continue to have your blood tested as directed by your health care provider. Do I need screening for cancer? Depending on your health history and family history, you may need to have cancer screenings at different stages of your life. This may include screening for: Breast cancer. Cervical cancer. Lung cancer. Colorectal cancer. What is my risk for osteoporosis? After menopause, you may be   at increased risk for osteoporosis. Osteoporosis is a condition in which bone destruction happens more quickly than new bone creation. To help prevent osteoporosis or  the bone fractures that can happen because of osteoporosis, you may take the following actions: If you are 19-50 years old, get at least 1,000 mg of calcium and at least 600 international units (IU) of vitamin D per day. If you are older than age 50 but younger than age 70, get at least 1,200 mg of calcium and at least 600 international units (IU) of vitamin D per day. If you are older than age 70, get at least 1,200 mg of calcium and at least 800 international units (IU) of vitamin D per day. Smoking and drinking excessive alcohol increase the risk of osteoporosis. Eat foods that are rich in calcium and vitamin D, and do weight-bearing exercises several times each week as directed by your health care provider. How does menopause affect my mental health? Depression may occur at any age, but it is more common as you become older. Common symptoms of depression include: Feeling depressed. Changes in sleep patterns. Changes in appetite or eating patterns. Feeling an overall lack of motivation or enjoyment of activities that you previously enjoyed. Frequent crying spells. Talk with your health care provider if you think that you are experiencing any of these symptoms. General instructions See your health care provider for regular wellness exams and vaccines. This may include: Scheduling regular health, dental, and eye exams. Getting and maintaining your vaccines. These include: Influenza vaccine. Get this vaccine each year before the flu season begins. Pneumonia vaccine. Shingles vaccine. Tetanus, diphtheria, and pertussis (Tdap) booster vaccine. Your health care provider may also recommend other immunizations. Tell your health care provider if you have ever been abused or do not feel safe at home. Summary Menopause is a normal process in which your ability to get pregnant comes to an end. This condition causes hot flashes, night sweats, decreased interest in sex, mood swings, headaches, or lack  of sleep. Treatment for this condition may include hormone replacement therapy. Take actions to keep yourself healthy, including exercising regularly, eating a healthy diet, watching your weight, and checking your blood pressure and blood sugar levels. Get screened for cancer and depression. Make sure that you are up to date with all your vaccines. This information is not intended to replace advice given to you by your health care provider. Make sure you discuss any questions you have with your health care provider. Document Revised: 11/07/2020 Document Reviewed: 11/07/2020 Elsevier Patient Education  2023 [employer]  

## 2022-01-23 NOTE — Progress Notes (Signed)
Subjective:    Patient ID: Melinda [name], female    DOB: 1955/07/09, 66 y.o.   MRN: [id number]  HPI  Patient presents to clinic today for her annual exam.  Flu: 04/2021 Tetanus: 07/2019 COVID: Pfizer x4 Shingrix: 03/2019 Pneumovax: 06/2021 Pap smear: 08/2017 Mammogram: 04/2021 Bone density:never Colon screening: never Vision screening: annually Dentist: as needed  Diet: She does eat meat. She consumes fruits and veggies. She does eat some fried foods. She drinks mostly water, 1 dt. Soda. 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 b.) Doppler 06/28/2015: 40-59% BILATERAL ICA. c.) Dopplers 04/23/2019, 04/22/2020, 10/20/2021: 73-42% RICA and 8-76% LICA.   Blue toes    2nd toe on right foot, will get appt.   Bulging disc    CAD (coronary artery disease) 2009   a.) PCI with placement of a 15 x 30 mm Promus DES to mLAD. b.) LHC 02/27/2011: EF 60%; no sig CAD; mild lum irreg at ostium of diagnoal (jailed by stent).   Chicken pox    Chronic fatigue    Chronic, continuous use of opioids    CKD (chronic kidney disease), stage III (HCC)    Degenerative disc disease    Depression    Diverticulosis    Facet joint disease    Failed cervical fusion    Fibromyalgia    Hiatal hernia    Hyperlipidemia    IBS (irritable bowel syndrome)    MCL deficiency, knee    MRSA (methicillin resistant staph aureus) culture positive 2011   GREAT TOE RIGHT FOOT   Neuropathy 04/18/2010   Orthostatic hypotension 01/2020   a.) multiple pre-syncopal episodes; takes proamatine   OSA on CPAP 08/17/2003   Osteoarthritis    a.) back, neck, hands, knees   Restless  leg syndrome    Sepsis ([place]) 09/2013   Spinal headache 11/07/2021   Spinal stenosis    T2DM (type 2 diabetes mellitus) ([place])    Wolff-Parkinson-White (WPW) syndrome 1992   a.) s/p ablations; unsuccessful. Now has rare episodes    Current Outpatient Medications  Medication Sig Dispense Refill   ACCU-CHEK GUIDE test strip USE 1 EACH BY OTHER ROUTE 3 (THREE) TIMES DAILY. USE AS INSTRUCTE 200 strip 1   Accu-Chek Softclix Lancets lancets USE TO CHECK BLOOD SUGAR 1 TIME DAILY 100 each 2   Ascorbic Acid (VITAMIN C) 1000 MG tablet Take 1,000 mg by mouth daily.     ASPIRIN 81 PO Take 81 mg by mouth daily.     Biotin 10000 MCG TABS Take 10,000 mcg by mouth daily.     Blood Glucose Calibration (ACCU-CHEK GUIDE CONTROL) LIQD 1 each by In Vitro route as needed. 1 each 1   buPROPion (WELLBUTRIN XL) 150 MG 24 hr tablet Take 1 tablet (150 mg total) by mouth daily. 90 tablet 1   CALCIUM-MAGNESIUM-ZINC PO Take 3 capsules by mouth daily.     CINNAMON PO Take 1,000 mg by mouth daily.     Coenzyme Q10 (CO Q 10 PO) Take 1 tablet by mouth daily.     ezetimibe (ZETIA) 10 MG tablet TAKE 1 TABLET BY MOUTH  EVERY DAY 90 tablet 0   fludrocortisone (FLORINEF) 0.1 MG tablet Take 1 tablet (0.1 mg total) by mouth daily. (Patient taking differently: Take 0.1 mg by mouth every morning.) 90 tablet 3   glucosamine-chondroitin 500-400 MG tablet Take 2 tablets by mouth daily.      Insulin Pen Needle 31G X 5 MM MISC BD Pen Needles- brand specific Inject insulin via insulin pen 6 x daily 100 each 2   Magnesium 400 MG CAPS Take 400 mg by mouth at bedtime.     midodrine (PROAMATINE) 5 MG tablet Take 1 tablet (5 mg total) by mouth 3 (three) times daily with meals. 270 tablet 3   nitroGLYCERIN (NITROSTAT) 0.4 MG SL tablet PLACE 1 TABLET UNDER THE TONGUE EVERY 5 (FIVE) MINUTES AS NEEDED FOR CHEST PAIN. 25 tablet 4   Omega-3 Fatty Acids (FISH OIL) 1200 MG CAPS Take 1,200 mg by mouth in the morning.      oxyCODONE-acetaminophen  (PERCOCET) 5-325 MG tablet Take 1 tablet by mouth every 8 (eight) hours as needed for severe pain. Each refill must last 30 days. 90 tablet 0   [START ON 01/24/2022] oxyCODONE-acetaminophen (PERCOCET) 5-325 MG tablet Take 1 tablet by mouth every 8 (eight) hours as needed for severe pain. Each refill must last 30 days. 90 tablet 0   OZEMPIC, 0.25 OR 0.5 MG/DOSE, 2 MG/3ML SOPN Inject 0.5 mg into the skin once a week. (Patient taking differently: Inject 0.5 mg into the skin once a week. Wednesday) 9 mL 0   PARoxetine (PAXIL) 20 MG tablet TAKE 1/2 TABLET BY MOUTH DAILY IN THE MORNING 45 tablet 0   polyethylene glycol (MIRALAX / GLYCOLAX) packet Take 17 g by mouth daily.     pregabalin (LYRICA) 150 MG capsule TAKE 1 CAPSULE BY MOUTH 3 TIMES DAILY. 270 capsule 0   rosuvastatin (CRESTOR) 20 MG tablet TAKE 1 TABLET BY MOUTH EVERY DAY 90 tablet 3   TART CHERRY PO Take 1 tablet by mouth daily at 6 (six) AM.     tiZANidine (ZANAFLEX) 4 MG tablet TAKE 1 TABLET (4 MG TOTAL) BY MOUTH EVERY 8 (EIGHT) HOURS AS NEEDED FOR MUSCLE SPASMS 270 tablet 0   vitamin E 180 MG (400 UNITS) capsule Take 400 Units by mouth daily.     No current facility-administered medications for this visit.    No Known Allergies  Family History  Problem Relation Age of Onset   Cancer Mother    Lung cancer Mother    Arthritis Father    Heart disease Father    Diabetes Father    Dementia Maternal Grandmother    Alcohol abuse Paternal Grandfather    Heart disease Paternal Grandfather    Stroke Paternal Grandfather     Social History   Socioeconomic History   Marital status: Significant Other    Spouse name: Not on file   Number of children: Not on file   Years of education: Not on file   Highest education level: Not on file  Occupational History   Not on file  Tobacco Use   Smoking status: Former    Packs/day: 1.50    Years: 32.00    Total pack years: 48.00    Types: Cigarettes    Quit date: 07/22/2007    Years since  quitting: 14.5   Smokeless tobacco: Never  Vaping Use   Vaping Use: Never used  Substance and Sexual Activity   Alcohol use: Yes    Comment: occ - Holidays  Drug use: Never    Comment: prescribed pain pump and oxy   Sexual activity: Yes  Other Topics Concern   Not on file  Social History Narrative   Lives at home with a partner. Independent at baseline   Social Determinants of Health   Financial Resource Strain: Low Risk  (10/05/2021)   Overall Financial Resource Strain (CARDIA)    Difficulty of Paying Living Expenses: Not hard at all  Food Insecurity: No Food Insecurity (10/05/2021)   Hunger Vital Sign    Worried About Running Out of Food in the Last Year: Never true    Ran Out of Food in the Last Year: Never true  Transportation Needs: No Transportation Needs (10/05/2021)   PRAPARE - Hydrologist (Medical): No    Lack of Transportation (Non-Medical): No  Physical Activity: Inactive (10/05/2021)   Exercise Vital Sign    Days of Exercise per Week: 0 days    Minutes of Exercise per Session: 0 min  Stress: Not on file  Social Connections: Moderately Isolated (10/05/2021)   Social Connection and Isolation Panel [NHANES]    Frequency of Communication with Friends and Family: More than three times a week    Frequency of Social Gatherings with Friends and Family: Never    Attends Religious Services: Never    Marine scientist or Organizations: No    Attends Music therapist: Never    Marital Status: Living with partner  Intimate Partner Violence: Not on file     Constitutional: Denies fever, malaise, fatigue, headache or abrupt weight changes.  HEENT: Denies eye pain, eye redness, ear pain, ringing in the ears, wax buildup, runny nose, nasal congestion, bloody nose, or sore throat. Respiratory: Denies difficulty breathing, shortness of breath, cough or sputum production.   Cardiovascular: Denies chest pain, chest tightness, palpitations  or swelling in the hands or feet.  Gastrointestinal: Patient reports intermittent constipation.  Denies abdominal pain, bloating, diarrhea or blood in the stool.  GU: Denies urgency, frequency, pain with urination, burning sensation, blood in urine, odor or discharge. Musculoskeletal: Patient reports chronic joint and muscle pain.  Denies decrease in range of motion, difficulty with gait, or joint swelling.  Skin: Denies redness, rashes, lesions or ulcercations.  Neurological: Patient reports neuropathic pain.  Denies dizziness, difficulty with memory, difficulty with speech or problems with balance and coordination.  Psych: Patient has a history of anxiety and depression.  Denies SI/HI.  No other specific complaints in a complete review of systems (except as listed in HPI above).  Objective:   Physical Exam  BP 124/66 (BP Location: Left Arm, Patient Position: Sitting, Cuff Size: Large)   Pulse 73   Temp (!) 97.5 F (36.4 C) (Temporal)   Ht _0  (1.753 m)   Wt 210 lb (95.3 kg)   LMP  (LMP Unknown)   SpO2 96%   BMI 31.01 kg/m   Wt Readings from Last 3 Encounters:  01/22/22 210 lb (95.3 kg)  01/03/22 207 lb (93.9 kg)  12/26/21 205 lb (93 kg)    General: Appears her stated age, obese, in NAD. Skin: Warm, dry and intact. No ulcerations noted. HEENT: Head: normal shape and size; Eyes: sclera white, no icterus, conjunctiva pink, PERRLA and EOMs intact;  Neck:  Neck supple, trachea midline. No masses, lumps or thyromegaly present.  Cardiovascular: Normal rate and rhythm. S1,S2 noted.  No murmur, rubs or gallops noted. No JVD or BLE edema. No carotid bruits noted.  Pulmonary/Chest: Normal effort and positive vesicular breath sounds. No respiratory distress. No wheezes, rales or ronchi noted.  Abdomen: Soft and nontender. Normal bowel sounds.  Musculoskeletal: Strength 5/5 BUE/BLE. No difficulty with gait.  Neurological: Alert and oriented. Cranial nerves II-XII grossly intact.  Coordination normal.  Psychiatric: Mood and affect normal. Behavior is normal. Judgment and thought content normal.    BMET    Component Value Date/Time   NA 139 10/23/2021 1552   NA 135 (L) 06/30/2013 1203   K 4.8 10/23/2021 1552   K 4.1 06/30/2013 1203   CL 99 10/23/2021 1552   CL 102 06/30/2013 1203   CO2 31 06/22/2021 1450   CO2 32 06/30/2013 1203   GLUCOSE 61 (L) 10/23/2021 1552   GLUCOSE 85 06/22/2021 1450   GLUCOSE 143 (H) 06/30/2013 1203   BUN 21 10/23/2021 1552   BUN 19 (H) 06/30/2013 1203   CREATININE 1.29 (H) 10/23/2021 1552   CREATININE 1.04 06/22/2021 1450   CALCIUM 9.8 10/23/2021 1552   CALCIUM 9.7 06/30/2013 1203   GFRNONAA 56 (L) 12/21/2020 0957   GFRAA 65 12/21/2020 0957    Lipid Panel     Component Value Date/Time   CHOL 98 06/22/2021 1450   TRIG 114 06/22/2021 1450   HDL 39 (L) 06/22/2021 1450   CHOLHDL 2.5 06/22/2021 1450   VLDL 31.6 03/12/2019 1255   LDLCALC 39 06/22/2021 1450    CBC    Component Value Date/Time   WBC 5.7 12/11/2021 1400   RBC 4.39 12/11/2021 1400   HGB 12.3 12/11/2021 1400   HGB 14.0 11/12/2011 1136   HCT 38.1 12/11/2021 1400   HCT 40.7 11/12/2011 1136   PLT 184 12/11/2021 1400   PLT 274 11/12/2011 1136   MCV 86.8 12/11/2021 1400   MCV 86 11/12/2011 1136   MCH 28.0 12/11/2021 1400   MCHC 32.3 12/11/2021 1400   RDW 11.9 12/11/2021 1400   RDW 12.8 11/12/2011 1136   LYMPHSABS 1.4 09/06/2020 1330   LYMPHSABS 2.5 11/12/2011 1136   MONOABS 0.5 09/06/2020 1330   MONOABS 0.8 11/12/2011 1136   EOSABS 0.2 09/06/2020 1330   EOSABS 0.6 11/12/2011 1136   BASOSABS 0.1 09/06/2020 1330   BASOSABS 0.1 11/12/2011 1136    Hgb A1C Lab Results  Component Value Date   HGBA1C 5.6 06/22/2021          Assessment & Plan:   Preventative Health Maintenance:  Encouraged her to get a flu shot in the fall Tetanus UTD COVID-vaccine UTD Pneumovax UTD Encouraged her to get her second Shingrix vaccine Pap smear UTD Mammogram  and bone density ordered-she will call to schedule Referral to GI for colonoscopy Encouraged her to consume a balanced diet and exercise regimen Advised her to see an eye doctor and dentist annually We will check CBC, c-Met, lipid, A1c, urine microalbumin  RTC in 6 months, follow-up chronic conditions [name], NP

## 2022-01-24 ENCOUNTER — Other Ambulatory Visit: Payer: Self-pay | Admitting: Internal Medicine

## 2022-01-24 DIAGNOSIS — M7918 Myalgia, other site: Secondary | ICD-10-CM

## 2022-01-24 DIAGNOSIS — G8929 Other chronic pain: Secondary | ICD-10-CM

## 2022-01-24 LAB — LIPID PANEL
Cholesterol: 98 mg/dL (ref <200)
HDL: 39 mg/dL — ABNORMAL LOW (ref >=50)
LDL Cholesterol (Calc): 36 mg/dL (calc)
Non-HDL Cholesterol (Calc): 59 mg/dL (calc) (ref <130)
Total CHOL/HDL Ratio: 2.5 (calc) (ref <5.0)
Triglycerides: 155 mg/dL — ABNORMAL HIGH (ref <150)

## 2022-01-24 LAB — CBC
HCT: 38.6 % (ref 35.0–45.0)
Hemoglobin: 12.9 g/dL (ref 11.7–15.5)
MCH: 28.9 pg (ref 27.0–33.0)
MCHC: 33.4 g/dL (ref 32.0–36.0)
MCV: 86.4 fL (ref 80.0–100.0)
MPV: 9 fL (ref 7.5–12.5)
Platelets: 202 10*3/uL (ref 140–400)
RBC: 4.47 10*6/uL (ref 3.80–5.10)
RDW: 12.5 % (ref 11.0–15.0)
WBC: 5.5 10*3/uL (ref 3.8–10.8)

## 2022-01-24 LAB — COMPLETE METABOLIC PANEL WITH GFR
AG Ratio: 1.7 (calc) (ref 1.0–2.5)
ALT: 29 U/L (ref 6–29)
AST: 27 U/L (ref 10–35)
Albumin: 4.2 g/dL (ref 3.6–5.1)
Alkaline phosphatase (APISO): 90 U/L (ref 37–153)
BUN/Creatinine Ratio: 24 (calc) — ABNORMAL HIGH (ref 6–22)
BUN: 27 mg/dL — ABNORMAL HIGH (ref 7–25)
CO2: 31 mmol/L (ref 20–32)
Calcium: 9.2 mg/dL (ref 8.6–10.4)
Chloride: 105 mmol/L (ref 98–110)
Creat: 1.13 mg/dL — ABNORMAL HIGH (ref 0.50–1.05)
Globulin: 2.5 g/dL (calc) (ref 1.9–3.7)
Glucose, Bld: 92 mg/dL (ref 65–139)
Potassium: 4.7 mmol/L (ref 3.5–5.3)
Sodium: 143 mmol/L (ref 135–146)
Total Bilirubin: 0.4 mg/dL (ref 0.2–1.2)
Total Protein: 6.7 g/dL (ref 6.1–8.1)
eGFR: 54 mL/min/{1.73_m2} — ABNORMAL LOW (ref >=60)

## 2022-01-24 LAB — MICROALBUMIN / CREATININE URINE RATIO
Creatinine, Urine: 69 mg/dL (ref 20–275)
Microalb, Ur: <0.2 mg/dL

## 2022-01-24 LAB — HEMOGLOBIN A1C
Hgb A1c MFr Bld: 5.7 % of total Hgb — ABNORMAL HIGH (ref <5.7)
Mean Plasma Glucose: 117 mg/dL
eAG (mmol/L): 6.5 mmol/L

## 2022-01-25 ENCOUNTER — Other Ambulatory Visit: Payer: Self-pay

## 2022-01-25 ENCOUNTER — Telehealth: Payer: Self-pay

## 2022-01-25 DIAGNOSIS — Z1211 Encounter for screening for malignant neoplasm of colon: Secondary | ICD-10-CM

## 2022-01-25 MED ORDER — NA SULFATE-K SULFATE-MG SULF 17.5-3.13-1.6 GM/177ML PO SOLN: 354.0000 mL | Freq: Once | ORAL | 0 refills | Status: AC

## 2022-01-25 NOTE — Telephone Encounter (Signed)
Gastroenterology Pre-Procedure Review  Request Date: 02/22/2022 Requesting Physician: Dr. Vicente Males   PATIENT REVIEW QUESTIONS: The patient responded to the following health history questions as indicated:    1. Are you having any GI issues? Yes constipation because she takes Oxycodone 3 times a day. A lot of Gas. Wants to go over 2. Do you have a personal history of Polyps? No  3. Do you have a family history of Colon Cancer or Polyps? Yes Paternal Grandfather  4. Diabetes Mellitus? Yes Ozempic  5. Joint replacements in the past 12 months?no 6. Major health problems in the past 3 months?No  7. Any artificial heart valves, MVP, or defibrillator?no    MEDICATIONS & ALLERGIES:    Patient reports the following regarding taking any anticoagulation/antiplatelet therapy:   Plavix, Coumadin, Eliquis, Xarelto, Lovenox, Pradaxa, Brilinta, or Effient? no Aspirin? '81mg'$    Patient confirms/reports the following medications:  Current Outpatient Medications  Medication Sig Dispense Refill   ACCU-CHEK GUIDE test strip USE 1 EACH BY OTHER ROUTE 3 (THREE) TIMES DAILY. USE AS INSTRUCTE 200 strip 1   Accu-Chek Softclix Lancets lancets USE TO CHECK BLOOD SUGAR 1 TIME DAILY 100 each 2   Ascorbic Acid (VITAMIN C) 1000 MG tablet Take 1,000 mg by mouth daily.     ASPIRIN 81 PO Take 81 mg by mouth daily.     Biotin 10000 MCG TABS Take 10,000 mcg by mouth daily.     Blood Glucose Calibration (ACCU-CHEK GUIDE CONTROL) LIQD 1 each by In Vitro route as needed. 1 each 1   buPROPion (WELLBUTRIN XL) 150 MG 24 hr tablet TAKE 1 TABLET BY MOUTH EVERY DAY 90 tablet 0   CALCIUM-MAGNESIUM-ZINC PO Take 3 capsules by mouth daily.     CINNAMON PO Take 1,000 mg by mouth daily.     Coenzyme Q10 (CO Q 10 PO) Take 1 tablet by mouth daily.     ezetimibe (ZETIA) 10 MG tablet TAKE 1 TABLET BY MOUTH EVERY DAY 90 tablet 0   fludrocortisone (FLORINEF) 0.1 MG tablet Take 1 tablet (0.1 mg total) by mouth daily. (Patient taking differently:  Take 0.1 mg by mouth every morning.) 90 tablet 3   glucosamine-chondroitin 500-400 MG tablet Take 2 tablets by mouth daily.      Insulin Pen Needle 31G X 5 MM MISC BD Pen Needles- brand specific Inject insulin via insulin pen 6 x daily 100 each 2   Magnesium 400 MG CAPS Take 400 mg by mouth at bedtime.     midodrine (PROAMATINE) 5 MG tablet Take 1 tablet (5 mg total) by mouth 3 (three) times daily with meals. 270 tablet 3   nitroGLYCERIN (NITROSTAT) 0.4 MG SL tablet PLACE 1 TABLET UNDER THE TONGUE EVERY 5 (FIVE) MINUTES AS NEEDED FOR CHEST PAIN. 25 tablet 4   Omega-3 Fatty Acids (FISH OIL) 1200 MG CAPS Take 1,200 mg by mouth in the morning.      oxyCODONE-acetaminophen (PERCOCET) 5-325 MG tablet Take 1 tablet by mouth every 8 (eight) hours as needed for severe pain. Each refill must last 30 days. 90 tablet 0   oxyCODONE-acetaminophen (PERCOCET) 5-325 MG tablet Take 1 tablet by mouth every 8 (eight) hours as needed for severe pain. Each refill must last 30 days. 90 tablet 0   OZEMPIC, 0.25 OR 0.5 MG/DOSE, 2 MG/3ML SOPN Inject 0.5 mg into the skin once a week. (Patient taking differently: Inject 0.5 mg into the skin once a week. Wednesday) 9 mL 0   PARoxetine (PAXIL) 20 MG  tablet TAKE 1/2 TABLET BY MOUTH DAILY IN THE MORNING 45 tablet 0   polyethylene glycol (MIRALAX / GLYCOLAX) packet Take 17 g by mouth daily.     pregabalin (LYRICA) 150 MG capsule TAKE 1 CAPSULE BY MOUTH 3 TIMES DAILY. 270 capsule 0   rosuvastatin (CRESTOR) 20 MG tablet TAKE 1 TABLET BY MOUTH EVERY DAY 90 tablet 3   TART CHERRY PO Take 1 tablet by mouth daily at 6 (six) AM.     tiZANidine (ZANAFLEX) 4 MG tablet TAKE 1 TABLET (4 MG TOTAL) BY MOUTH EVERY 8 (EIGHT) HOURS AS NEEDED FOR MUSCLE SPASMS 270 tablet 0   vitamin E 180 MG (400 UNITS) capsule Take 400 Units by mouth daily.     No current facility-administered medications for this visit.    Patient confirms/reports the following allergies:  No Known Allergies  No orders of  the defined types were placed in this encounter.   AUTHORIZATION INFORMATION Primary Insurance: 1D#: Group #:  Secondary Insurance: 1D#: Group #:  SCHEDULE INFORMATION: Date:  Time: Location:

## 2022-01-25 NOTE — Telephone Encounter (Signed)
Requested medication (s) are due for refill today: yes  Requested medication (s) are on the active medication list: yes  Last refill:  10/28/21  Future visit scheduled: no  Notes to clinic:  Unable to refill per protocol, cannot delegate.      Requested Prescriptions  Pending Prescriptions Disp Refills   tiZANidine (ZANAFLEX) 4 MG tablet [Pharmacy Med Name: TIZANIDINE HCL 4 MG TABLET] 270 tablet 0    Sig: TAKE 1 TABLET (4 MG TOTAL) BY MOUTH EVERY 8 (EIGHT) HOURS AS NEEDED FOR MUSCLE SPASMS     Not Delegated - Cardiovascular:  Alpha-2 Agonists - tizanidine Failed - 01/24/2022  2:14 PM      Failed - This refill cannot be delegated      Passed - Valid encounter within last 6 months    Recent Outpatient Visits           2 days ago Screening for colon cancer   [facility] [place], Coralie Keens, NP   3 weeks ago Opioid-induced constipation (OIC)   [facility] [place], Coralie Keens, NP   4 months ago Preop general physical exam   [facility], Dani Gobble, [place]   4 months ago [facility] [place], Coralie Keens, NP   7 months ago Type 2 diabetes mellitus with diabetic mononeuropathy, without long-term current use of insulin ([place])   [facility] [place], Coralie Keens, NP       Future Appointments             In 3 months Skains, Thana Farr, MD [facility] [address] Office, [facility]            Signed Prescriptions Disp Refills   buPROPion (WELLBUTRIN XL) 150 MG 24 hr tablet 90 tablet 0    Sig: TAKE 1 TABLET BY MOUTH EVERY DAY     Psychiatry: Antidepressants - bupropion Failed - 01/24/2022  2:14 PM      Failed - Cr in normal range and within 360 days    Creat  Date Value Ref Range Status  01/23/2022 1.13 (H) 0.50 - 1.05 mg/dL Final   Creatinine,U  Date Value Ref Range Status  03/12/2019 131.0 mg/dL Final   Creatinine, Urine  Date Value Ref Range Status  01/23/2022 69 20 - 275 mg/dL Final          Passed - AST in normal range and within 360 days    AST  Date Value Ref Range Status  01/23/2022 27 10 - 35 U/L Final         Passed - ALT in normal range and within 360 days    ALT  Date Value Ref Range Status  01/23/2022 29 6 - 29 U/L Final         Passed - Completed PHQ-2 or PHQ-9 in the last 360 days      Passed - Last BP in normal range    BP Readings from Last 1 Encounters:  01/23/22 124/66         Passed - Valid encounter within last 6 months    Recent Outpatient Visits           2 days ago Screening for colon cancer   [facility] Jearld Fenton, NP   3 weeks ago Opioid-induced constipation (OIC)   [facility] Jearld Fenton, NP   4 months ago Preop general physical exam   [facility]um, Dani Gobble, PA-C   4 months ago [facility] [place], [place] W, NP   7 months ago Type 2 diabetes mellitus with diabetic mononeuropathy, without long-term current use of insulin [facility]   [facility] [place], Coralie Keens, NP       Future Appointments             In 3 months Marlou Porch, Thana Farr, MD [facility] [place], [facility]

## 2022-01-25 NOTE — Telephone Encounter (Signed)
Requested Prescriptions  Pending Prescriptions Disp Refills  . buPROPion (WELLBUTRIN XL) 150 MG 24 hr tablet [Pharmacy Med Name: BUPROPION HCL XL 150 MG TABLET] 90 tablet 0    Sig: TAKE 1 TABLET BY MOUTH EVERY DAY     Psychiatry: Antidepressants - bupropion Failed - 01/24/2022  2:14 PM      Failed - Cr in normal range and within 360 days    Creat  Date Value Ref Range Status  01/23/2022 1.13 (H) 0.50 - 1.05 mg/dL Final   Creatinine,U  Date Value Ref Range Status  03/12/2019 131.0 mg/dL Final   Creatinine, Urine  Date Value Ref Range Status  01/23/2022 69 20 - 275 mg/dL Final         Passed - AST in normal range and within 360 days    AST  Date Value Ref Range Status  01/23/2022 27 10 - 35 U/L Final         Passed - ALT in normal range and within 360 days    ALT  Date Value Ref Range Status  01/23/2022 29 6 - 29 U/L Final         Passed - Completed PHQ-2 or PHQ-9 in the last 360 days      Passed - Last BP in normal range    BP Readings from Last 1 Encounters:  01/23/22 124/66         Passed - Valid encounter within last 6 months    Recent Outpatient Visits          2 days ago Screening for colon cancer   [facility] Jearld Fenton, NP   3 weeks ago Opioid-induced constipation (OIC)   [facility], Coralie Keens, NP   4 months ago Preop general physical exam   [place], [place]   4 months ago [facility] [place], [place] W, NP   7 months ago Type 2 diabetes mellitus with diabetic mononeuropathy, without long-term current use of insulin ([place])   [facility], Coralie Keens, NP      Future Appointments            In 3 months Marlou Porch, Thana Farr, MD [place], [facility]           . tiZANidine (ZANAFLEX) 4 MG tablet [Pharmacy Med Name: TIZANIDINE HCL 4 MG TABLET] 270 tablet 0    Sig: TAKE 1 TABLET (4 MG TOTAL) BY MOUTH EVERY 8 (EIGHT)  HOURS AS NEEDED FOR MUSCLE SPASMS     Not Delegated - Cardiovascular:  Alpha-2 Agonists - tizanidine Failed - 01/24/2022  2:14 PM      Failed - This refill cannot be delegated      Passed - Valid encounter within last 6 months    Recent Outpatient Visits          2 days ago Screening for colon cancer   [place], Coralie Keens, NP   3 weeks ago Opioid-induced constipation (OIC)   [facility], Coralie Keens, NP   4 months ago Preop general physical exam   [place], [place]   4 months ago [facility] [place], [place] W, NP   7 months ago Type 2 diabetes mellitus with diabetic mononeuropathy, without long-term current use of insulin ([place])   [facility],  Coralie Keens, NP      Future Appointments            In 3 months Skains, Thana Farr, MD [facility], [facility]

## 2022-01-29 ENCOUNTER — Ambulatory Visit: Payer: Medicare Other | Attending: Pain Medicine | Admitting: Pain Medicine

## 2022-01-29 ENCOUNTER — Encounter: Payer: Self-pay | Admitting: Pain Medicine

## 2022-01-29 VITALS — BP 143/79 | HR 67 | Temp 97.5°F | Resp 18 | Ht 69.0 in | Wt 205.0 lb

## 2022-01-29 DIAGNOSIS — G8929 Other chronic pain: Secondary | ICD-10-CM | POA: Diagnosis not present

## 2022-01-29 DIAGNOSIS — Z79891 Long term (current) use of opiate analgesic: Secondary | ICD-10-CM | POA: Diagnosis not present

## 2022-01-29 DIAGNOSIS — M47812 Spondylosis without myelopathy or radiculopathy, cervical region: Secondary | ICD-10-CM

## 2022-01-29 DIAGNOSIS — G894 Chronic pain syndrome: Secondary | ICD-10-CM | POA: Diagnosis not present

## 2022-01-29 DIAGNOSIS — M961 Postlaminectomy syndrome, not elsewhere classified: Secondary | ICD-10-CM | POA: Diagnosis not present

## 2022-01-29 DIAGNOSIS — R937 Abnormal findings on diagnostic imaging of other parts of musculoskeletal system: Secondary | ICD-10-CM | POA: Diagnosis not present

## 2022-01-29 DIAGNOSIS — M51379 Other intervertebral disc degeneration, lumbosacral region without mention of lumbar back pain or lower extremity pain: Secondary | ICD-10-CM

## 2022-01-29 DIAGNOSIS — M47816 Spondylosis without myelopathy or radiculopathy, lumbar region: Secondary | ICD-10-CM | POA: Diagnosis not present

## 2022-01-29 DIAGNOSIS — M542 Cervicalgia: Secondary | ICD-10-CM | POA: Diagnosis not present

## 2022-01-29 DIAGNOSIS — M5441 Lumbago with sciatica, right side: Secondary | ICD-10-CM | POA: Diagnosis not present

## 2022-01-29 DIAGNOSIS — M5137 Other intervertebral disc degeneration, lumbosacral region: Secondary | ICD-10-CM | POA: Insufficient documentation

## 2022-01-29 DIAGNOSIS — G96198 Other disorders of meninges, not elsewhere classified: Secondary | ICD-10-CM

## 2022-01-29 DIAGNOSIS — M533 Sacrococcygeal disorders, not elsewhere classified: Secondary | ICD-10-CM | POA: Insufficient documentation

## 2022-01-29 DIAGNOSIS — F1190 Opioid use, unspecified, uncomplicated: Secondary | ICD-10-CM | POA: Diagnosis present

## 2022-01-29 DIAGNOSIS — Z79899 Other long term (current) drug therapy: Secondary | ICD-10-CM

## 2022-01-29 DIAGNOSIS — M25551 Pain in right hip: Secondary | ICD-10-CM | POA: Insufficient documentation

## 2022-01-29 DIAGNOSIS — M25552 Pain in left hip: Secondary | ICD-10-CM | POA: Insufficient documentation

## 2022-01-29 MED ORDER — OXYCODONE-ACETAMINOPHEN 5-325 MG PO TABS: 1.0000 | ORAL_TABLET | Freq: Three times a day (TID) | ORAL | 0 refills | Status: DC | PRN

## 2022-01-29 MED ORDER — OXYCODONE-ACETAMINOPHEN 5-325 MG PO TABS
1.0000 | ORAL_TABLET | Freq: Three times a day (TID) | ORAL | 0 refills | Status: DC | PRN
Start: 2022-02-23 — End: 2022-06-11

## 2022-01-29 NOTE — Progress Notes (Signed)
PROVIDER NOTE: Information contained herein reflects review and annotations entered in association with encounter. Interpretation of such information and data should be left to medically-trained personnel. Information provided to patient can be located elsewhere in the medical record under "Patient Instructions". Document created using STT-dictation technology, any transcriptional errors that may result from process are unintentional.    Patient: Melinda [name]  Service Category: E/M  Provider: Gaspar Cola, MD  DOB: 04/05/1956  DOS: 01/29/2022  Referring Provider: Jearld Fenton, NP  MRN: [id number]  Specialty: Interventional Pain Management  PCP: Jearld Fenton, NP  Type: Established Patient  Setting: Ambulatory outpatient    Location: Office  Delivery: Face-to-face     HPI  Ms. Consetta Cosner, a 66 y.o. year old female, is here today because of her Cervicalgia [M54.2]. Ms. Kitamura primary complain today is Neck Pain Last encounter: My last encounter with her was on 12/26/2021. Pertinent problems: Ms. Gilvin has Chronic pain syndrome; Failed back surgical syndrome; Failed cervical surgery syndrome (ACDF); Chronic neck pain; Chronic low back pain (Bilateral) (L>R) w/ sciatica (Right); Epidural fibrosis; Cervical spondylosis; Chronic lower extremity pain (Left); Chronic lumbar radicular pain (Left); Neuropathic pain; Lumbar facet syndrome; Fibromyalgia; Osteoarthritis of knee (Bilateral) (R>L); Lumbar spondylosis; Lumbar spinal stenosis w/ neurogenic claudication; Abnormal MRI, lumbar spine (11/18/2019); Lumbar facet hypertrophy (Multilevel); Lumbar foraminal stenosis; Lumbar lateral recess stenosis; Chronic right-sided low back pain w/o sciatica from IT catheter anchor.; Chronic low back pain (Bilateral) w/o sciatica; DDD (degenerative disc disease), lumbosacral; Thoracic facet hypertrophy/arthropathy (Multilevel) (Bilateral); Thoracic facet syndrome (Multilevel) (Bilateral); Malfunction of  intrathecal infusion pump; Malposition of intrathecal infusion catheter; Pain and numbness of upper extremity (Bilateral); Cervicalgia; History of carpal tunnel surgery (Bilateral); Recurrent right knee instability; 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 Pain Assessment: Severity of Chronic pain is reported as a 5 /10. Location: Neck Left/starts radiating mid arm down to hand, effects all finger pinkie finger. Onset: More than a month ago. Quality: Aching, Constant, Sharp, Discomfort, Numbness (shocking). Timing: Intermittent. Modifying factor(s): rest,changing positions. Vitals:  height is _0  (1.753 m) and weight is 205 lb (93 kg). Her temperature is 97.5 F (36.4 C) (abnormal). Her blood pressure is 143/79 (abnormal) and her pulse is 67. Her respiration is 18 and oxygen saturation is 99%.   Reason for encounter: medication management and evaluation of increased cervical pain..  The patient indicates doing well with the current medication regimen. No adverse reactions or side effects reported to the medications.   The patient indicates having pain primarily in the area of the neck (Bilateral) (L>R).  She had a prior ACDF in 2015.  There is an MRI of the cervical spine done in 2022 which I have reviewed today.  The patient indicates her second worst pain to be that of the left upper extremity with the pain and numbness going down into her thumb, index, and middle finger.  She also mentions that occasionally she will have some numbness in half of her ring finger.  These correspond to the C6, C7, and C8 dermatomal distributions  with the primary being the C6 and C7.  Using that information, I went back to the imaging on her MRI and it does show that she has some foraminal stenosis  in that region.  She refers that depending on how she positions her neck, her left arm will go numb.  At this point, the patient seems to be having cervical radicular symptoms and therefore I will go ahead and schedule her to have a left-sided cervical epidural steroid injection under fluoroscopic guidance.  The patient was informed of the plan which she understood and agreed with.  RTCB: 05/24/2022  Pharmacotherapy Assessment  Analgesic: Oxycodone 5 mg, 1 tab PO q 8 hrs (15 mg/day of oxycodone) + Intrathecal PF-Fentanyl MME/day: 22.5 mg/day (oral).   Monitoring: [place] PMP: PDMP reviewed during this encounter.       Pharmacotherapy: No side-effects or adverse reactions reported. Compliance: No problems identified. Effectiveness: Clinically acceptable.  Ignatius Specking, RN  01/29/2022 12:56 PM  Sign when Signing Visit Nursing Pain Medication Assessment:  Safety precautions to be maintained throughout the outpatient stay will include: orient to surroundings, keep bed in low position, maintain call bell within reach at all times, provide assistance with transfer out of bed and ambulation.  Medication Inspection Compliance: Pill count conducted under aseptic conditions, in front of the patient. Neither the pills nor the bottle was removed from the patient's sight at any time. Once count was completed pills were immediately returned to the patient in their original bottle.  Medication: Oxycodone/APAP Pill/Patch Count: 88/90 Pill/Patch Appearance: Markings consistent with prescribed medication Bottle Appearance: Standard pharmacy container. Clearly labeled. Filled Date: 07 / 28 / 2023 Last Medication intake:  Today    UDS:  Summary  Date Value Ref Range Status  12/26/2021 Note  Final    Comment:     ==================================================================== [employer] [facility] (MW) ==================================================================== Test                             Result       Flag       Units  Drug Present and Declared for Prescription Verification   Oxycodone                      669          EXPECTED   ng/mg creat   Oxymorphone                    153          EXPECTED   ng/mg creat   Noroxycodone                   1985         EXPECTED   ng/mg creat    Sources of oxycodone include scheduled prescription medications.    Oxymorphone and noroxycodone are expected metabolites of oxycodone.    Oxymorphone is also available as a scheduled prescription medication.  Drug Absent but Declared for Prescription Verification   Fentanyl                       Not Detected UNEXPECTED ng/mg creat ==================================================================== Test                      Result    Flag   Units      Ref Range   Creatinine              112              mg/dL      >=20 ==================================================================== Declared Medications:  The flagging and interpretation on this report are based on the  following declared medications.  Unexpected results may arise from  inaccuracies in the declared medications.   **Note: The testing scope of this panel includes these medications:   Fentanyl  Oxycodone (Percocet)   **Note: The testing scope of this panel does not include the  following reported medications:   Acetaminophen (Percocet)  Aspirin  Baclofen  Biotin  Bupivacaine  Bupropion (Wellbutrin)  Calcium  Chondroitin  Cinnamon  Ezetimibe (Zetia)  Fish Oil  Fludrocortisone (Florinef)  Glucosamine  Magnesium  Midodrine (Proamatine)  Nitroglycerin (Nitrostat)  Paroxetine (Paxil)  Polyethylene Glycol (MiraLAX)  Pregabalin (Lyrica)  Rosuvastatin (Crestor)  Semaglutide (Ozempic)  Tizanidine (Zanaflex)  Ubiquinone  (CoQ10)  Undefined Miscellaneous Drug  Vitamin C  Vitamin E  Zinc ==================================================================== For clinical consultation, please call [phone]. ====================================================================      ROS  Constitutional: Denies any fever or chills Gastrointestinal: No reported hemesis, hematochezia, vomiting, or acute GI distress Musculoskeletal: Denies any acute onset joint swelling, redness, loss of ROM, or weakness Neurological: No reported episodes of acute onset apraxia, aphasia, dysarthria, agnosia, amnesia, paralysis, loss of coordination, or loss of consciousness  Medication Review  Accu-Chek Guide Control, Accu-Chek Softclix Lancets, Aspirin, Biotin, Calcium-Magnesium-Zinc, Cinnamon, Coenzyme Q10, Fish Oil, Insulin Pen Needle, Magnesium, PARoxetine, Semaglutide(0.25 or 0.5MG/DOS), Tart Cherry, buPROPion, ezetimibe, fludrocortisone, glucosamine-chondroitin, glucose blood, midodrine, nitroGLYCERIN, oxyCODONE-acetaminophen, polyethylene glycol, pregabalin, rosuvastatin, tiZANidine, vitamin C, and vitamin E  History Review  Allergy: Ms. Erdman has No Known Allergies. Drug: Ms. Woods  reports no history of drug use. Alcohol:  reports current alcohol use. Tobacco:  reports that she quit smoking about 14 years ago. Her smoking use included cigarettes. She has a 48.00 pack-year smoking history. She has never used smokeless tobacco. Social: Ms. Spear  reports that she quit smoking about 14 years ago. Her smoking use included cigarettes. She has a 48.00 pack-year smoking history. She has never used smokeless tobacco. She reports current alcohol use. She reports that she does not use drugs. 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[place]) (05/30/2010), Amputation of second toe, right, traumatic ([place]) (09/2017), Anginal pain ([place]), Anxiety, Aortic atherosclerosis  ([place]), Bilateral carotid artery stenosis (12/24/2014), Blue toes, Bulging disc, CAD (coronary artery disease) (2009), Chicken pox, Chronic fatigue, Chronic, continuous use of opioids, CKD (chronic kidney disease), stage III ([place]), Degenerative disc disease, Depression, Diverticulosis, Facet joint disease, Failed cervical fusion, Fibromyalgia, Hiatal hernia, Hyperlipidemia, IBS (irritable bowel syndrome), MCL deficiency, knee, MRSA (methicillin resistant staph aureus) culture positive (2011), Neuropathy (04/18/2010), Orthostatic hypotension (01/2020), OSA on CPAP (08/17/2003), Osteoarthritis, Restless leg syndrome, Sepsis ([place]) (09/2013), Spinal headache (11/07/2021), Spinal stenosis, T2DM (type 2 diabetes mellitus) ([place]), and Wolff-Parkinson-White (WPW) syndrome (1992). Surgical: Ms. Moscoso  has a past surgical history that includes Toe amputation (Right); Hand surgery (Left); Gallbladder surgery; Endometrial ablation (2007); Cardiac electrophysiology study and ablation (N/A, 1992); Plantar fascia surgery (Bilateral); Toe amputation (Right); Infusion pump implantation; Back surgery; Cholecystectomy (2003); Anterior cervical decomp/discectomy fusion (N/A, 07/28/2014); Carpal tunnel release (Right); Blepharoplasty (Bilateral, 2013); Amputation toe (Right, 02/01/2017); Irrigation and debridement foot (Right, 02/23/2017); Toe Surgery; Hammer toe surgery (Right, 10/17/2017); Intrathecal pump revision (N/A, 04/25/2018); Intrathecal pump revision (Right, 04/25/2018); Coronary angioplasty with stent (N/A, 2009); Pain pump revision (N/A, 08/15/2018); Intrathecal pump revision (N/A, 12/12/2018); Intrathecal pump revision (N/A, 03/11/2020); Knee surgery (Right, 05/24/2020); Total knee arthroplasty (Right, 05/24/2020); Total knee revision (Right, 09/06/2020); Excision partial phalanx (Left, 09/28/2021); Wound  debridement (Left, 09/28/2021); LEFT HEART CATH AND CORONARY ANGIOGRAPHY (Left, 02/27/2011); and Intrathecal pump  revision (Right, 12/14/2021). Family: family history includes Alcohol abuse in her paternal grandfather; Arthritis in her father; Cancer in her mother; Dementia in her maternal grandmother; Diabetes in her father; Heart disease in her father and paternal grandfather; Lung cancer in her mother; Stroke in her paternal grandfather.  Laboratory Chemistry Profile   Renal Lab Results  Component Value Date   BUN 27 (H) 01/23/2022   CREATININE 1.13 (H) 01/23/2022   LABCREA 69 01/23/2022   BCR 24 (H) 01/23/2022   GFR 51.87 (L) 03/22/2020   GFRAA 65 12/21/2020   GFRNONAA 56 (L) 12/21/2020    Hepatic Lab Results  Component Value Date   AST 27 01/23/2022   ALT 29 01/23/2022   ALBUMIN 4.7 10/23/2021   ALKPHOS 103 10/23/2021   HCVAB NEGATIVE 05/18/2016   LIPASE 11 10/04/2013    Electrolytes Lab Results  Component Value Date   NA 143 01/23/2022   K 4.7 01/23/2022   CL 105 01/23/2022   CALCIUM 9.2 01/23/2022   MG 2.1 06/30/2013    Bone Lab Results  Component Value Date   VD25OH 34.99 03/12/2019    Inflammation (CRP: Acute Phase) (ESR: Chronic Phase) Lab Results  Component Value Date   CRP 1 05/06/2018   ESRSEDRATE 52 (H) 04/02/2020   LATICACIDVEN 0.9 04/01/2020         Note: Above Lab results reviewed.  Recent Imaging Review  CT L-SPINE NO CHARGE CLINICAL DATA:  Low back pain with symptoms persisting for greater than 6 weeks. Possible intrathecal pump/catheter disconnection.  EXAM: CT THORACIC AND LUMBAR SPINE WITHOUT CONTRAST  TECHNIQUE: Multidetector CT imaging of the thoracic and lumbar spine was performed without contrast. Multiplanar CT image reconstructions were also generated.  RADIATION DOSE REDUCTION: This exam was performed according to the departmental dose-optimization program which includes automated exposure control, adjustment of the mA and/or kV according to patient size and/or use of iterative reconstruction technique.  COMPARISON:  None  Available.  FINDINGS: There is an intrathecal catheter with tip terminating at T5. The catheter enters the spinal canal at the T11-12 inter spinous space.  CT THORACIC SPINE FINDINGS  Alignment: Normal.  Vertebrae: No acute fracture or focal pathologic process.  Paraspinal and other soft tissues: Negative.  Disc levels: No spinal canal stenosis. Intrathecal catheter terminates at the T5 level.  CT LUMBAR SPINE FINDINGS  Segmentation: 5 lumbar type vertebrae.  Alignment: Mild S shaped scoliosis.  Vertebrae: No acute fracture or focal pathologic process.  Paraspinal and other soft tissues: Calcific aortic atherosclerosis.  Disc levels:  L1-2: Disc bulge with endplate spurring and mild facet hypertrophy. Mild left foraminal narrowing.  L2-3: Left asymmetric disc bulge with endplate spurring and mild left foraminal stenosis.  L3-4: Moderate facet hypertrophy with small disc bulge and mild spinal canal narrowing.  L4-5: Moderate facet hypertrophy with small right asymmetric disc bulge and bulky right-sided osteophyte. Mild right foraminal stenosis.  L5-S1: Small disc bulge without spinal canal or neural foraminal stenosis. Left facet hypertrophy.  IMPRESSION: 1. No acute fracture or static subluxation of the thoracic or lumbar spine. 2. Intrathecal catheter terminates at the T5 level. The catheter enters the spinal canal at the T11-12 inter spinous space. No discontinuity along this portion of the catheter. Please refer to concomitant report for CT abdomen pelvis for findings related to the catheter pump and proximal catheter tubing. 3. Mild foraminal stenosis at L1-2, L2-3, L3-4 and L4-5.  Aortic Atherosclerosis (ICD10-I70.0).  Electronically Signed   By: Ulyses Jarred M.D.   On: 11/16/2021 19:00 CT THORACIC SPINE WO CONTRAST CLINICAL DATA:  Low back pain with symptoms persisting for greater than 6 weeks. Possible intrathecal pump/catheter  disconnection.  EXAM: CT THORACIC AND LUMBAR SPINE WITHOUT CONTRAST  TECHNIQUE: Multidetector CT imaging of the thoracic and lumbar spine was performed without contrast. Multiplanar CT image reconstructions were also generated.  RADIATION DOSE REDUCTION: This exam was performed according to the departmental dose-optimization program which includes automated exposure control, adjustment of the mA and/or kV according to patient size and/or use of iterative reconstruction technique.  COMPARISON:  None Available.  FINDINGS: There is an intrathecal catheter with tip terminating at T5. The catheter enters the spinal canal at the T11-12 inter spinous space.  CT THORACIC SPINE FINDINGS  Alignment: Normal.  Vertebrae: No acute fracture or focal pathologic process.  Paraspinal and other soft tissues: Negative.  Disc levels: No spinal canal stenosis. Intrathecal catheter terminates at the T5 level.  CT LUMBAR SPINE FINDINGS  Segmentation: 5 lumbar type vertebrae.  Alignment: Mild S shaped scoliosis.  Vertebrae: No acute fracture or focal pathologic process.  Paraspinal and other soft tissues: Calcific aortic atherosclerosis.  Disc levels:  L1-2: Disc bulge with endplate spurring and mild facet hypertrophy. Mild left foraminal narrowing.  L2-3: Left asymmetric disc bulge with endplate spurring and mild left foraminal stenosis.  L3-4: Moderate facet hypertrophy with small disc bulge and mild spinal canal narrowing.  L4-5: Moderate facet hypertrophy with small right asymmetric disc bulge and bulky right-sided osteophyte. Mild right foraminal stenosis.  L5-S1: Small disc bulge without spinal canal or neural foraminal stenosis. Left facet hypertrophy.  IMPRESSION: 1. No acute fracture or static subluxation of the thoracic or lumbar spine. 2. Intrathecal catheter terminates at the T5 level. The catheter enters the spinal canal at the T11-12 inter spinous space.  No discontinuity along this portion of the catheter. Please refer to concomitant report for CT abdomen pelvis for findings related to the catheter pump and proximal catheter tubing. 3. Mild foraminal stenosis at L1-2, L2-3, L3-4 and L4-5.  Aortic Atherosclerosis (ICD10-I70.0).  Electronically Signed   By: Ulyses Jarred M.D.   On: 11/16/2021 19:00 CT ABDOMEN PELVIS WO CONTRAST CLINICAL DATA:  Abdominal wall mass suspected. Recurrent abdominal wall seroma possible intrathecal pump catheter disconnection. Malfunctioning intrathecal pump.  Chronic pain syndrome. Presence of intrathecal pump. Presence of functional implant. Encounter for adjustment or management of infusion pump. Postprocedural seroma of a musculoskeletal structure following other procedure.  EXAM: CT ABDOMEN AND PELVIS WITHOUT CONTRAST  TECHNIQUE: Multidetector CT imaging of the abdomen and pelvis was performed following the standard protocol without IV contrast.  RADIATION DOSE REDUCTION: This exam was performed according to the departmental dose-optimization program which includes automated exposure control, adjustment of the mA and/or kV according to patient size and/or use of iterative reconstruction technique.  COMPARISON:  CT 04/16/2019  FINDINGS: Lower chest: Hypoventilatory changes and scattered subsegmental atelectasis in the lung bases. No pleural effusion. There are coronary artery calcifications.  Hepatobiliary: No evidence of focal liver lesion. Post cholecystectomy with stable biliary dilatation from prior exam.  Pancreas: No ductal dilatation or inflammation.  Spleen: Normal in size without focal abnormality.  Adrenals/Urinary Tract: Normal adrenal glands. Bilateral extrarenal pelvis configuration of the kidneys. No perinephric edema. Slight left renal parenchymal atrophy and scarring. No renal calculi. No evidence of focal renal lesion on this unenhanced exam. Partially distended but  unremarkable urinary bladder.  Stomach/Bowel: Small hiatal hernia. The stomach is decompressed. Administered enteric contrast reaches the distal small bowel. No small bowel obstruction or inflammation. No small bowel wall thickening. The appendix is normal. Moderate volume of stool throughout the colon. Occasional colonic diverticula. No diverticulitis, colonic wall thickening, or pericolonic edema. The sigmoid colon is redundant.  Vascular/Lymphatic: Moderate aortic atherosclerosis. No aortic aneurysm. No abdominopelvic adenopathy.  Reproductive: Uterus and bilateral adnexa are unremarkable.  Other: Right lower quadrant subcutaneous infusion pump. Fluid collection surrounding the pump was present on remote prior exam, but has increased in size measuring 12.5 x 10.6 x 10.1 cm (TR by AP by CC). There is no definite internal complexity of the fluid. This causes mass effect on the anterior abdominal wall both anterior and posteriorly. Radiopaque catheter is seen within the fluid collection. The left lateral aspect of the catheter appears to connect to the infusion pump, series 2, image 51. However there is questionable discontinuity about the right lateral aspect of the catheter adjacent to the infusion pump, series 2, image 52, although this is not well assessed due to adjacent streak artifact. There is a thin non radiopaque soft tissue tract extending from the infusion pump posterolaterally in the abdominal wall extending to a radiopaque portion posterior to T12-L1. This tract previously contained radiopaque catheter. Please note that concurrent thoracic and lumbar spine CT performed well assessed the intrathecal portion of the catheter.  There is no intra-abdominal or intrapelvic fluid collection.  Musculoskeletal: Thoracic and lumbar spine assessed on concurrent spine CT. Mild bilateral hip osteoarthritis. No suspicious bone lesion or acute osseous abnormalities of the  pelvis.  IMPRESSION: 1. Right lower quadrant subcutaneous infusion pump with interval increase in size of fluid collection surrounding the pump, now measuring 12.5 x 10.6 x 10.1 cm. This causes mass effect on the anterior abdominal wall both anterior and posteriorly. Questionable discontinuity about the right aspect of the catheter within the fluid collection, although this area is not well assessed due to adjacent metallic streak artifact. 2. Thin non radiopaque soft tissue tract extending from the infusion pump posterolaterally in the abdominal wall extending to a radiopaque portion posterior to T12-L1. This tract previously contained radiopaque catheter. There is no discontinuity of this tract. The intrathecal portion of the catheter is better assessed on concurrent thoracic and lumbar spine CT, reported separately. 3. Moderate colonic stool burden. Mild colonic diverticulosis without diverticulitis. 4. Small hiatal hernia.  Aortic Atherosclerosis (ICD10-I70.0).  Electronically Signed   By: Keith Rake M.D.   On: 11/16/2021 09:58 Note: Reviewed        Physical Exam  General appearance: Well nourished, well developed, and well hydrated. In no apparent acute distress Mental status: Alert, oriented x 3 (person, place, & time)       Respiratory: No evidence of acute respiratory distress Eyes: PERLA Vitals: BP (!) 143/79   Pulse 67   Temp (!) 97.5 F (36.4 C)   Resp 18   Ht _0  (1.753 m)   Wt 205 lb (93 kg)   LMP  (LMP Unknown)   SpO2 99%   BMI 30.27 kg/m  BMI: Estimated body mass index is 30.27 kg/m as calculated from the following:   Height as of this encounter: _1  (1.753 m).   Weight as of this encounter: 205 lb (93 kg). Ideal: Ideal body weight: 66.2 kg (145 lb 15.1 oz) Adjusted ideal body weight: 76.9 kg (169 lb 9.1 oz)  Assessment   Diagnosis Status  1. Cervicalgia  2. Cervical spondylosis   3. Abnormal MRI, cervical spine (11/21/2020)   4.  Chronic pain syndrome   5. Chronic low back pain (Bilateral) (L>R) w/ sciatica (Right)   6. DDD (degenerative disc disease), lumbosacral   7. Epidural fibrosis   8. Failed back surgical syndrome   9. Lumbar facet syndrome   10. Chronic use of opiate for therapeutic purpose   11. Encounter for medication management   12. Encounter for chronic pain management   13. Chronic sacroiliac joint pain (Bilateral)   14. Chronic hip pain (Bilateral)   15. Long term current use of opiate analgesic   16. Opiate use (22.5 MME/Day) (oral)   17. Pharmacologic therapy    Controlled Controlled Controlled   Updated Problems: Problem  Abnormal MRI, cervical spine (11/21/2020)   FINDINGS: Alignment: 1 mm of degenerative anterolisthesis at C7-T1.   Vertebrae: Previous ACDF C3 through C6. Mild discogenic endplate edema at the anterior superior corner of the C7 vertebral body.   Cord: No cord compression or primary cord lesion. No evidence of gliosis or myelomalacia.   Posterior Fossa, vertebral arteries, paraspinal tissues: Negative   Disc levels:   Foramen magnum is widely patent. Ordinary mild osteoarthritis at the C1-2 articulation but without encroachment upon the neural structures.   C2-3: Mild noncompressive disc bulge. 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 No canal stenosis. Mild left foraminal narrowing without definite neural compression. Facet arthritis could be painful.   C3 through C6: Previous ACDF as seen previously. Allowing for artifact related to fusion hardware, the canal and foramina appear sufficiently patent.   C6-7: Endplate osteophytes and bulging of the disc. Mild facet degeneration and hypertrophy, left more than right. No definitely compressive narrowing of the canal or foramina. Mild narrowing of the foramen on the left could possibly result in irritation of the left C7 nerve.   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  No disc pathology. No compressive canal stenosis. Foraminal narrowing on the left that could possibly affect the C8 nerve.   IMPRESSION: Previous ACDF from C3 through C6 has an unchanged in good appearance.   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 Mild edema of the joint. This could be a cause of neck pain or referred facet syndrome pain. Some foraminal narrowing on the left that could possibly affect the left C3 nerve.   C6-7: Endplate osteophytes, bulging of the disc and mild facet degeneration and hypertrophy, left more than right. Mild left foraminal narrowing, but without definite compression of the left C7 nerve. The nerve irritation would be possible.   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 1 mm degenerative anterolisthesis. Left foraminal narrowing that could possibly affect the left C8 nerve. Similar appearance to the previous study.     Plan of Care  Problem-specific:  No problem-specific Assessment & Plan notes found for this encounter.  Ms. Amariyana Heacox has a current medication list which includes the following long-term medication(s): bupropion, ezetimibe, nitroglycerin, [START ON 02/23/2022] oxycodone-acetaminophen, [START ON 03/25/2022] oxycodone-acetaminophen, [START ON 04/24/2022] oxycodone-acetaminophen, paroxetine, pregabalin, rosuvastatin, and tizanidine.  Pharmacotherapy (Medications Ordered): Meds ordered this encounter  Medications   oxyCODONE-acetaminophen (PERCOCET) 5-325 MG tablet    Sig: Take 1 tablet by mouth every 8 (eight) hours as needed for severe pain. Must last 30 days.    Dispense:  90 tablet    Refill:  0    DO NOT: delete (not duplicate); no partial-fill (will deny script to complete), no  refill request (F/U required). DISPENSE: 1 day early if closed on fill date. WARN: No CNS-depressants within 8 hrs of med.   oxyCODONE-acetaminophen (PERCOCET) 5-325 MG tablet    Sig: Take 1 tablet by mouth every 8 (eight) hours as  needed for severe pain. Must last 30 days.    Dispense:  90 tablet    Refill:  0    DO NOT: delete (not duplicate); no partial-fill (will deny script to complete), no refill request (F/U required). DISPENSE: 1 day early if closed on fill date. WARN: No CNS-depressants within 8 hrs of med.   oxyCODONE-acetaminophen (PERCOCET) 5-325 MG tablet    Sig: Take 1 tablet by mouth every 8 (eight) hours as needed for severe pain. Must last 30 days.    Dispense:  90 tablet    Refill:  0    DO NOT: delete (not duplicate); no partial-fill (will deny script to complete), no refill request (F/U required). DISPENSE: 1 day early if closed on fill date. WARN: No CNS-depressants within 8 hrs of med.   Orders:  Orders Placed This Encounter  Procedures   Cervical Epidural Injection    Sedation: Patient's choice. 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    Standing Status:   Future    Standing Expiration Date:   05/01/2022    Scheduling Instructions:     Procedure: Cervical Epidural Steroid Injection/Block     Level(s): C7-T1     Laterality: Left-sided     Timeframe: As soon as schedule allows    Order Specific Question:   Where will this procedure be performed?    Answer:   [facility]    Comments:   by Dr. Dossie Arbour   Follow-up plan:   Return for procedure (ECT): (L) CESI #1.     Interventional Therapies  Risk  Complexity Considerations:   Estimated body mass index is 30.72 kg/m as calculated from the following:   Height as of this encounter: _0  (1.753 m).   Weight as of this encounter: 208 lb (94.3 kg). WNL   Planned  Pending:   Diagnostic/therapeutic left cervical ESI #1    Under consideration:   Possible left lumbar facet RFA  Possible right lumbar facet RFA  Diagnostic caudal ESI + diagnostic epidurogram  Possible Racz procedure  Diagnostic bilateral IA knee injection (Steroid) Diagnostic  bilateral genicular NB  Possible bilateral genicular nerve RFA  Possible bilateral Hyalgan knee injection  Diagnostic cervical ESI  Diagnostic bilateral cervical facet block  Possible bilateral cervical facet RFA    Completed:   Diagnostic/therapeutic left L2-3 LESI x1  Diagnostic/therapeutic right lumbar facet block x3 (06/23/2019) (100/100/75/>75)  Diagnostic/therapeutic left lumbar facet block x1 (06/23/2019) (100/100/75/>75)    Therapeutic  Palliative (PRN) options:   Palliative/therapeutic intrathecal pump management (refills/programming adjustments)  Palliative left L2-3 LESI #2  Palliative right lumbar facet block #4  Diagnostic left lumbar facet block #2     Recent Visits Date Type Provider Dept  12/26/21 Office Visit [name], MD [facility]  12/07/21 Procedure visit [name], MD [facility]  11/07/21 Procedure visit [name], MD [facility]  Showing recent visits within past 90 days and meeting all other requirements Today's Visits Date Type Provider Dept  01/29/22 Office Visit [name], MD [facility]  Showing today's visits and meeting all other requirements Future Appointments Date Type Provider Dept  02/12/22 Appointment [name], MD [facility]  Showing  future appointments within next 90 days and meeting all other requirements  I discussed the assessment and treatment plan with the patient. The patient was provided an opportunity to ask questions and all were answered. The patient agreed with the plan and demonstrated an understanding of the instructions.  Patient advised to call back or seek an in-person evaluation if the symptoms or condition worsens.  Duration of encounter: 30 minutes.  Total time on encounter, as per AMA guidelines included both the face-to-face and non-face-to-face time personally spent by the physician and/or other qualified health care  professional(s) on the day of the encounter (includes time in activities that require the physician or other qualified health care professional and does not include time in activities normally performed by clinical staff). Physician's time may include the following activities when performed: preparing to see the patient (eg, review of tests, pre-charting review of records) obtaining and/or reviewing separately obtained history performing a medically appropriate examination and/or evaluation counseling and educating the patient/family/caregiver ordering medications, tests, or procedures referring and communicating with other health care professionals (when not separately reported) documenting clinical information in the electronic or other health record independently interpreting results (not separately reported) and communicating results to the patient/ family/caregiver care coordination (not separately reported)  Note by: Gaspar Cola, MD Date: 01/29/2022; Time: 1:33 PM

## 2022-01-29 NOTE — Patient Instructions (Addendum)
______________________________________________________________________  Preparing for Procedure with Sedation  NOTICE: Due to recent regulatory changes, starting on January 30, 2021, procedures requiring intravenous (IV) sedation will no longer be performed at the [facility].  These types of procedures are required to be performed at [facility] ambulatory surgery facility.  We are very sorry for the inconvenience.  Procedure appointments are limited to planned procedures: No Prescription Refills. No disability issues will be discussed. No medication changes will be discussed.  Instructions: Oral Intake: Do not eat or drink anything for at least 8 hours prior to your procedure. (Exception: Blood Pressure Medication. See below.) Transportation: A driver is required. You may not drive yourself after the procedure. Blood Pressure Medicine: Do not forget to take your blood pressure medicine with a sip of water the morning of the procedure. If your Diastolic (lower reading) is above 100 mmHg, elective cases will be cancelled/rescheduled. Blood thinners: These will need to be stopped for procedures. Notify our staff if you are taking any blood thinners. Depending on which one you take, there will be specific instructions on how and when to stop it. Diabetics on insulin: Notify the staff so that you can be scheduled 1st case in the morning. If your diabetes requires high dose insulin, take only  of your normal insulin dose the morning of the procedure and notify the staff that you have done so. Preventing infections: Shower with an antibacterial soap the morning of your procedure. Build-up your immune system: Take 1000 mg of Vitamin C with every meal (3 times a day) the day prior to your procedure. Antibiotics: Inform the staff if you have a condition or reason that requires you to take antibiotics before dental procedures. Pregnancy: If you are pregnant, call and cancel the procedure. Sickness: If  you have a cold, fever, or any active infections, call and cancel the procedure. Arrival: You must be in the facility at least 30 minutes prior to your scheduled procedure. Children: Do not bring children with you. Dress appropriately: There is always the possibility that your clothing may get soiled. Valuables: Do not bring any jewelry or valuables.  Reasons to call and reschedule or cancel your procedure: (Following these recommendations will minimize the risk of a serious complication.) Surgeries: Avoid having procedures within 2 weeks of any surgery. (Avoid for 2 weeks before or after any surgery). Flu Shots: Avoid having procedures within 2 weeks of a flu shots. (Avoid for 2 weeks before or after immunizations). Barium: Avoid having a procedure within 7-10 days after having had a radiological study involving the use of radiological contrast. (Myelograms, Barium swallow or enema study). Heart attacks: Avoid any elective procedures or surgeries for the initial 6 months after a "Myocardial Infarction" (Heart Attack). Blood thinners: It is imperative that you stop these medications before procedures. Let us know if you if you take any blood thinner.  Infection: Avoid procedures during or within two weeks of an infection (including chest colds or gastrointestinal problems). Symptoms associated with infections include: Localized redness, fever, chills, night sweats or profuse sweating, burning sensation when voiding, cough, congestion, stuffiness, runny nose, sore throat, diarrhea, nausea, vomiting, cold or Flu symptoms, recent or current infections. It is specially important if the infection is over the area that we intend to treat. Heart and lung problems: Symptoms that may suggest an active cardiopulmonary problem include: cough, chest pain, breathing difficulties or shortness of breath, dizziness, ankle swelling, uncontrolled high or unusually low blood pressure, and/or palpitations. If you are    experiencing any of these symptoms, cancel your procedure and contact your primary care physician for an evaluation.  Remember:  Regular Business hours are:  Monday to Thursday 8:00 AM to 4:00 PM  Provider's Schedule: Francisco Naveira, MD:  Procedure days: Tuesday and Thursday 7:30 AM to 4:00 PM  Bilal Lateef, MD:  Procedure days: Monday and Wednesday 7:30 AM to 4:00 PM ______________________________________________________________________  ____________________________________________________________________________________________  General Risks and Possible Complications  Patient Responsibilities: It is important that you read this as it is part of your informed consent. It is our duty to inform you of the risks and possible complications associated with treatments offered to you. It is your responsibility as a patient to read this and to ask questions about anything that is not clear or that you believe was not covered in this document.  Patient's Rights: You have the right to refuse treatment. You also have the right to change your mind, even after initially having agreed to have the treatment done. However, under this last option, if you wait until the last second to change your mind, you may be charged for the materials used up to that point.  Introduction: Medicine is not an exact science. Everything in Medicine, including the lack of treatment(s), carries the potential for danger, harm, or loss (which is by definition: Risk). In Medicine, a complication is a secondary problem, condition, or disease that can aggravate an already existing one. All treatments carry the risk of possible complications. The fact that a side effects or complications occurs, does not imply that the treatment was conducted incorrectly. It must be clearly understood that these can happen even when everything is done following the highest safety standards.  No treatment: You can choose not to proceed with the  proposed treatment alternative. The "PRO(s)" would include: avoiding the risk of complications associated with the therapy. The "CON(s)" would include: not getting any of the treatment benefits. These benefits fall under one of three categories: diagnostic; therapeutic; and/or palliative. Diagnostic benefits include: getting information which can ultimately lead to improvement of the disease or symptom(s). Therapeutic benefits are those associated with the successful treatment of the disease. Finally, palliative benefits are those related to the decrease of the primary symptoms, without necessarily curing the condition (example: decreasing the pain from a flare-up of a chronic condition, such as incurable terminal cancer).  General Risks and Complications: These are associated to most interventional treatments. They can occur alone, or in combination. They fall under one of the following six (6) categories: no benefit or worsening of symptoms; bleeding; infection; nerve damage; allergic reactions; and/or death. No benefits or worsening of symptoms: In Medicine there are no guarantees, only probabilities. No healthcare provider can ever guarantee that a medical treatment will work, they can only state the probability that it may. Furthermore, there is always the possibility that the condition may worsen, either directly, or indirectly, as a consequence of the treatment. Bleeding: This is more common if the patient is taking a blood thinner, either prescription or over the counter (example: Goody Powders, Fish oil, Aspirin, Garlic, etc.), or if suffering a condition associated with impaired coagulation (example: Hemophilia, cirrhosis of the liver, low platelet counts, etc.). However, even if you do not have one on these, it can still happen. If you have any of these conditions, or take one of these drugs, make sure to notify your treating physician. Infection: This is more common in patients with a compromised  immune system, either due to disease (example:   diabetes, cancer, human immunodeficiency virus [HIV], etc.), or due to medications or treatments (example: therapies used to treat cancer and rheumatological diseases). However, even if you do not have one on these, it can still happen. If you have any of these conditions, or take one of these drugs, make sure to notify your treating physician. Nerve Damage: This is more common when the treatment is an invasive one, but it can also happen with the use of medications, such as those used in the treatment of cancer. The damage can occur to small secondary nerves, or to large primary ones, such as those in the spinal cord and brain. This damage may be temporary or permanent and it may lead to impairments that can range from temporary numbness to permanent paralysis and/or brain death. Allergic Reactions: Any time a substance or material comes in contact with our body, there is the possibility of an allergic reaction. These can range from a mild skin rash (contact dermatitis) to a severe systemic reaction (anaphylactic reaction), which can result in death. Death: In general, any medical intervention can result in death, most of the time due to an unforeseen complication. ____________________________________________________________________________________________ ____________________________________________________________________________________________  Pharmacy Shortages of Pain Medication   Introduction Shockingly as it may seem, .  "No U.[place] Supreme Court decision has ever interpreted the Constitution as guaranteeing a right to health care for all Americans." - https://huff.com/  "With respect to human rights, the [place] has no formally codified right to health, nor does it participate in a human rights treaty that specifies a right to health." - Scott J. Schweikart, JD,  MBE  Situation By now, most of our patients have had the experience of being told by their pharmacist that they do not have enough medication to cover their prescription. If you have not had this experience, just know that you soon will.  Problem There appears to be a shortage of these medications, either at the national level or locally. This is happening with all pharmacies. When there is not enough medication, patients are offered a partial fill and they are told that they will try to get the rest of the medicine for them at a later time. If they do not have enough for even a partial fill, the pharmacists are telling the patients to call us (the prescribing physicians) to request that we send another prescription to another pharmacy to get the medicine.   This reordering of a controlled substance creates documentation problems where additional paperwork needs to be created to explain why two prescriptions for the same period of time and the same medicine are being prescribed to the same patient. It also creates situations where the last appointment note does not accurately reflect when and what prescriptions were given to a patient. This leads to prescribing errors down the line, in subsequent follow-up visits.   [employer] of Pharmacy ([employer]) Research revealed that Surveyor, quantity .1806 (21 NCAC 46.1806) authorizes pharmacists to the transfer of prescriptions among pharmacies, and it sets forth procedural and recordkeeping requirements for doing so. However, this requires the pharmacist to complete the previously mentioned procedural paperwork to accomplish the transfer. As it turns out, it is much easier for them to have the prescribing physicians do the work.   Possible solutions 1. Have the [facility] Assembly add a provision to the "STOP ACT" (the law that mandates how controlled substances are prescribed) where there is an exception to the electronic prescribing rule  that states that  in the event there are shortages of medications the physicians are allowed to use written prescriptions as opposed to electronic ones. This would allow patients to take their prescriptions to a different pharmacy that may have enough medication available to fill the prescription. The problem is that currently there is a law that does not allow for written prescriptions, with the exception of instances where the electronic medical record is down due to technical issues.  2. Have [place] Congress ease the pressure on pharmaceutical companies, allowing them to produce enough quantities of the medication to adequately supply the population. 3. Have pharmacies keep enough stocks of these medications to cover their client base.  4. Have the [facility] Assembly add a provision to the "STOP ACT" where they ease the regulations surrounding the transfer of controlled substances between pharmacies, so as to simplify the transfer of supplies. As an alternative, develop a system to allow patients to obtain the remainder of their prescription at another one of their pharmacies or at an associate pharmacy.   How this shortage will affect you.  The one thing that is abundantly clear is that this is a pharmacy supply problem  and not a prescriber problem. The job of the prescriber is to evaluate and monitor the patients for the appropriate indications to the use of these medicines, the monitoring of their use and the prescribing of the appropriate dose and regimen. It is not the job of the prescriber to provide or dispense the actual medication. By law, this is the job of the pharmacies and pharmacists. It is certainly not the job of the prescriber to solve the supply problems.   Due to the above problems we are no longer taking patients to write for their pain medication. We will continue to evaluate for appropriate indications and we may provide recommendations regarding medication, dose, and  schedule, as well as monitoring recommendations, however, we will not be taking over the actual prescribing of these substances. On those patients where we are treating their chronic pain with interventional therapies, exceptions will be considered on a case by case basis. At this time, we will try to continue providing this supplemental service to those patients we have been managing in the past. However, as of August 1st, 2023, we no longer will be sending additional prescriptions to other pharmacies for the purpose of solving their supply problems. Once we send a prescription to a pharmacy, we will not be resending it again to another pharmacy to cover for their shortages.   What to do. Write as many letters as you can. Recruit the help of family members in writing these letters. Below are some of the places where you can write to make your voice heard. Let them know what the problem is and push them to look for solutions.   Search internet for: "[employer] find your legislators" NoseSwap.is  Search internet for: "[employer] commissioner complaints" Starlas.fi  Search internet for: "[place] complaints" https://www.hernandez-brewer.com/.htm  Search internet for: "[facility] pharmacy complaints" Email [facility] Customer Relations woondaal.com.jsp?callType=store  Search internet for: Programme researcher, broadcasting/film/video customer service complaints" https://www.[facility].com/topic/marketing/contactus/contactus_customerservice.jsp  ____________________________________________________________________________________________  ____________________________________________________________________________________________  Medication Rules  Purpose: To inform patients, and their family members, of our rules and regulations.  Applies to: All patients  receiving prescriptions (written or electronic).  Pharmacy of record: Pharmacy where electronic prescriptions will be sent. If written prescriptions are taken to a different pharmacy, please inform the nursing staff. The pharmacy listed in the  electronic medical record should be the one where you would like electronic prescriptions to be sent.  Electronic prescriptions: In compliance with the [place] (STOP) Act of 2017 (Session Lanny Cramp [phone]), effective July 02, 2018, all controlled substances must be electronically prescribed. Calling prescriptions to the pharmacy will cease to exist.  Prescription refills: Only during scheduled appointments. Applies to all prescriptions.  NOTE: The following applies primarily to controlled substances (Opioid* Pain Medications).   Type of encounter (visit): For patients receiving controlled substances, face-to-face visits are required. (Not an option or up to the patient.)  Patient's responsibilities: Pain Pills: Bring all pain pills to every appointment (except for procedure appointments). Pill Bottles: Bring pills in original pharmacy bottle. Always bring the newest bottle. Bring bottle, even if empty. Medication refills: You are responsible for knowing and keeping track of what medications you take and those you need refilled. The day before your appointment: write a list of all prescriptions that need to be refilled. The day of the appointment: give the list to the admitting nurse. Prescriptions will be written only during appointments. No prescriptions will be written on procedure days. If you forget a medication: it will not be "Called in", "Faxed", or "electronically sent". You will need to get another appointment to get these prescribed. No early refills. Do not call asking to have your prescription filled early. Prescription Accuracy: You are responsible for carefully inspecting your prescriptions before  leaving our office. Have the discharge nurse carefully go over each prescription with you, before taking them home. Make sure that your name is accurately spelled, that your address is correct. Check the name and dose of your medication to make sure it is accurate. Check the number of pills, and the written instructions to make sure they are clear and accurate. Make sure that you are given enough medication to last until your next medication refill appointment. Taking Medication: Take medication as prescribed. When it comes to controlled substances, taking less pills or less frequently than prescribed is permitted and encouraged. Never take more pills than instructed. Never take medication more frequently than prescribed.  Inform other Doctors: Always inform, all of your healthcare providers, of all the medications you take. Pain Medication from other Providers: You are not allowed to accept any additional pain medication from any other Doctor or Healthcare provider. There are two exceptions to this rule. (see below) In the event that you require additional pain medication, you are responsible for notifying us, as stated below. Cough Medicine: Often these contain an opioid, such as codeine or hydrocodone. Never accept or take cough medicine containing these opioids if you are already taking an opioid* medication. The combination may cause respiratory failure and death. Medication Agreement: You are responsible for carefully reading and following our Medication Agreement. This must be signed before receiving any prescriptions from our practice. Safely store a copy of your signed Agreement. Violations to the Agreement will result in no further prescriptions. (Additional copies of our Medication Agreement are available upon request.) Laws, Rules, & Regulations: All patients are expected to follow all Federal and [employer], [employer], Rules, [employer]. Ignorance of the Laws does not constitute a valid  excuse.  Illegal drugs and Controlled Substances: The use of illegal substances (including, but not limited to marijuana and its derivatives) and/or the illegal use of any controlled substances is strictly prohibited. Violation of this rule may result in the immediate and permanent discontinuation of any and all prescriptions being written  by our practice. The use of any illegal substances is prohibited. Adopted CDC guidelines & recommendations: Target dosing levels will be at or below 60 MME/day. Use of benzodiazepines** is not recommended.  Exceptions: There are only two exceptions to the rule of not receiving pain medications from other Healthcare Providers. Exception #1 (Emergencies): In the event of an emergency (i.e.: accident requiring emergency care), you are allowed to receive additional pain medication. However, you are responsible for: As soon as you are able, call our office [phone], at any time of the day or night, and leave a message stating your name, the date and nature of the emergency, and the name and dose of the medication prescribed. In the event that your call is answered by a member of our staff, make sure to document and save the date, time, and the name of the person that took your information.  Exception #2 (Planned Surgery): In the event that you are scheduled by another doctor or dentist to have any type of surgery or procedure, you are allowed (for a period no longer than 30 days), to receive additional pain medication, for the acute post-op pain. However, in this case, you are responsible for picking up a copy of our "Post-op Pain Management for Surgeons" handout, and giving it to your surgeon or dentist. This document is available at our office, and does not require an appointment to obtain it. Simply go to our office during business hours (Monday-Thursday from 8:00 AM to 4:00 PM) (Friday 8:00 AM to 12:00 Noon) or if you have a scheduled appointment with [place], prior to your  surgery, and ask for it by name. In addition, you are responsible for: calling our office [phone], at any time of the day or night, and leaving a message stating your name, name of your surgeon, type of surgery, and date of procedure or surgery. Failure to comply with your responsibilities may result in termination of therapy involving the controlled substances. Medication Agreement Violation. Following the above rules, including your responsibilities will help you in avoiding a Medication Agreement Violation ("Breaking your Pain Medication Contract").  *Opioid medications include: morphine, codeine, oxycodone, oxymorphone, hydrocodone, hydromorphone, meperidine, tramadol, tapentadol, buprenorphine, fentanyl, methadone. **Benzodiazepine medications include: diazepam (Valium), alprazolam (Xanax), clonazepam (Klonopine), lorazepam (Ativan), clorazepate (Tranxene), chlordiazepoxide (Librium), estazolam (Prosom), oxazepam (Serax), temazepam (Restoril), triazolam (Halcion) (Last updated: 03/29/2021) ____________________________________________________________________________________________  ____________________________________________________________________________________________  Medication Recommendations and Reminders  Applies to: All patients receiving prescriptions (written and/or electronic).  Medication Rules & Regulations: These rules and regulations exist for your safety and that of others. They are not flexible and neither are we. Dismissing or ignoring them will be considered "non-compliance" with medication therapy, resulting in complete and irreversible termination of such therapy. (See document titled "Medication Rules" for more details.) In all conscience, because of safety reasons, we cannot continue providing a therapy where the patient does not follow instructions.  Pharmacy of record:  Definition: This is the pharmacy where your electronic prescriptions will be sent.  We do  not endorse any particular pharmacy, however, we have experienced problems with [facility] not securing enough medication supply for the community. We do not restrict you in your choice of pharmacy. However, once we write for your prescriptions, we will NOT be re-sending more prescriptions to fix restricted supply problems created by your pharmacy, or your insurance.  The pharmacy listed in the electronic medical record should be the one where you want electronic prescriptions to be sent. If you choose to change pharmacy,  simply notify our nursing staff.  Recommendations: Keep all of your pain medications in a safe place, under lock and key, even if you live alone. We will NOT replace lost, stolen, or damaged medication. After you fill your prescription, take 1 week's worth of pills and put them away in a safe place. You should keep a separate, properly labeled bottle for this purpose. The remainder should be kept in the original bottle. Use this as your primary supply, until it runs out. Once it's gone, then you know that you have 1 week's worth of medicine, and it is time to come in for a prescription refill. If you do this correctly, it is unlikely that you will ever run out of medicine. To make sure that the above recommendation works, it is very important that you make sure your medication refill appointments are scheduled at least 1 week before you run out of medicine. To do this in an effective manner, make sure that you do not leave the office without scheduling your next medication management appointment. Always ask the nursing staff to show you in your prescription , when your medication will be running out. Then arrange for the receptionist to get you a return appointment, at least 7 days before you run out of medicine. Do not wait until you have 1 or 2 pills left, to come in. This is very poor planning and does not take into consideration that we may need to cancel appointments due to bad weather,  sickness, or emergencies affecting our staff. DO NOT ACCEPT A "Partial Fill": If for any reason your pharmacy does not have enough pills/tablets to completely fill or refill your prescription, do not allow for a "partial fill". The law allows the pharmacy to complete that prescription within 72 hours, without requiring a new prescription. If they do not fill the rest of your prescription within those 72 hours, you will need a separate prescription to fill the remaining amount, which we will NOT provide. If the reason for the partial fill is your insurance, you will need to talk to the pharmacist about payment alternatives for the remaining tablets, but again, DO NOT ACCEPT A PARTIAL FILL, unless you can trust your pharmacist to obtain the remainder of the pills within 72 hours.  Prescription refills and/or changes in medication(s):  Prescription refills, and/or changes in dose or medication, will be conducted only during scheduled medication management appointments. (Applies to both, written and electronic prescriptions.) No refills on procedure days. No medication will be changed or started on procedure days. No changes, adjustments, and/or refills will be conducted on a procedure day. Doing so will interfere with the diagnostic portion of the procedure. No phone refills. No medications will be "called into the pharmacy". No Fax refills. No weekend refills. No Holliday refills. No after hours refills.  Remember:  Business hours are:  Monday to Thursday 8:00 AM to 4:00 PM Provider's Schedule: [name], MD - Appointments are:  Medication management: Monday and Wednesday 8:00 AM to 4:00 PM Procedure day: Tuesday and Thursday 7:30 AM to 4:00 PM Gillis Santa, MD - Appointments are:  Medication management: Tuesday and Thursday 8:00 AM to 4:00 PM Procedure day: Monday and Wednesday 7:30 AM to 4:00 PM (Last update:  01/20/2020) ____________________________________________________________________________________________  ____________________________________________________________________________________________  CBD (cannabidiol) & Delta-8 (Delta-8 tetrahydrocannabinol) WARNING  Intro: Cannabidiol (CBD) and tetrahydrocannabinol (THC), are two natural compounds found in plants of the Cannabis genus. They can both be extracted from hemp or cannabis. Hemp and cannabis  come from the Cannabis sativa plant. Both compounds interact with your body's endocannabinoid system, but they have very different effects. CBD does not produce the high sensation associated with cannabis. Delta-8 tetrahydrocannabinol, also known as delta-8 THC, is a psychoactive substance found in the Cannabis sativa plant, of which marijuana and hemp are two varieties. THC is responsible for the high associated with the illicit use of marijuana.  Applicable to: All individuals currently taking or considering taking CBD (cannabidiol) and, more important, all patients taking opioid analgesic controlled substances (pain medication). (Example: oxycodone; oxymorphone; hydrocodone; hydromorphone; morphine; methadone; tramadol; tapentadol; fentanyl; buprenorphine; butorphanol; dextromethorphan; meperidine; codeine; etc.)  Legal status: CBD remains a Schedule I drug prohibited for any use. CBD is illegal with one exception. In the [place], CBD has a limited Transport planner (FDA) approval for the treatment of two specific types of epilepsy disorders. Only one CBD product has been approved by the FDA for this purpose: "Epidiolex". FDA is aware that some companies are marketing products containing cannabis and cannabis-derived compounds in ways that violate the [employer], Drug and Cosmetic Act [facility] Act) and that may put the health and safety of consumers at risk. The FDA, a Federal agency, has not enforced the CBD status since 2018. UPDATE:  (08/18/2021) The Drug Enforcement Agency ([place]) issued a letter stating that "delta" cannabinoids, including Delta-8-THCO and Delta-9-THCO, synthetically derived from hemp do not qualify as hemp and will be viewed as Schedule I drugs. (Schedule I drugs, substances, or chemicals are defined as drugs with no currently accepted medical use and a high potential for abuse. Some examples of Schedule I drugs are: heroin, lysergic acid diethylamide (LSD), marijuana (cannabis), 3,4-methylenedioxymethamphetamine (ecstasy), methaqualone, and peyote.) (https://jennings.com/)  Legality: Some manufacturers ship CBD products nationally, which is illegal. Often such products are sold online and are therefore available throughout the country. CBD is openly sold in head shops and health food stores in some states where such sales have not been explicitly legalized. Selling unapproved products with unsubstantiated therapeutic claims is not only a violation of the law, but also can put patients at risk, as these products have not been proven to be safe or effective. Federal illegality makes it difficult to conduct research on CBD.  Reference: "FDA Regulation of Cannabis and Cannabis-Derived Products, Including Cannabidiol (CBD)" - [employer]Art[employer].pt  Warning: CBD is not FDA approved and has not undergo the same manufacturing controls as prescription drugs.  This means that the purity and safety of available CBD may be questionable. Most of the time, despite manufacturer's claims, it is contaminated with THC (delta-9-tetrahydrocannabinol - the chemical in marijuana responsible for the "HIGH").  When this is the case, the [facility] contaminant will trigger a positive urine drug screen (UDS) test for Marijuana (carboxy-THC). Because a positive UDS for any illicit substance is a violation of our medication agreement, your opioid  analgesics (pain medicine) may be permanently discontinued. The FDA recently put out a warning about 5 things that everyone should be aware of regarding Delta-8 THC: Delta-8 THC products have not been evaluated or approved by the FDA for safe use and may be marketed in ways that put the public health at risk. The FDA has received adverse event reports involving delta-8 THC-containing products. Delta-8 THC has psychoactive and intoxicating effects. Delta-8 THC manufacturing often involve use of potentially harmful chemicals to create the concentrations of delta-8 THC claimed in the marketplace. The final delta-8 THC product may have potentially harmful by-products (contaminants) due to the chemicals  used in the process. Manufacturing of delta-8 THC products may occur in uncontrolled or unsanitary settings, which may lead to the presence of unsafe contaminants or other potentially harmful substances. Delta-8 THC products should be kept out of the reach of children and pets.  MORE ABOUT CBD  General Information: CBD was discovered in [birth year] and it is a derivative of the cannabis sativa genus plants (Marijuana and Hemp). It is one of the 113 identified substances found in Marijuana. It accounts for up to 40% of the plant's extract. As of 2018, preliminary clinical studies on CBD included research for the treatment of anxiety, movement disorders, and pain. CBD is available and consumed in multiple forms, including inhalation of smoke or vapor, as an aerosol spray, and by mouth. It may be supplied as an oil containing CBD, capsules, dried cannabis, or as a liquid solution. CBD is thought not to be as psychoactive as THC (delta-9-tetrahydrocannabinol - the chemical in marijuana responsible for the "HIGH"). Studies suggest that CBD may interact with different biological target receptors in the body, including cannabinoid and other neurotransmitter receptors. As of 2018 the mechanism of action for its biological  effects has not been determined.  Side-effects  Adverse reactions: Dry mouth, diarrhea, decreased appetite, fatigue, drowsiness, malaise, weakness, sleep disturbances, and others.  Drug interactions: CBC may interact with other medications such as blood-thinners. Because CBD causes drowsiness on its own, it also increases the drowsiness caused by other medications, including antihistamines (such as Benadryl), benzodiazepines (Xanax, Ativan, Valium), antipsychotics, antidepressants and opioids, as well as alcohol and supplements such as kava, melatonin and St. John's Wort. Be cautious with the following combinations:   Brivaracetam (Briviact) Brivaracetam is changed and broken down by the body. CBD might decrease how quickly the body breaks down brivaracetam. This might increase levels of brivaracetam in the body.  Caffeine Caffeine is changed and broken down by the body. CBD might decrease how quickly the body breaks down caffeine. This might increase levels of caffeine in the body.  Carbamazepine (Tegretol) Carbamazepine is changed and broken down by the body. CBD might decrease how quickly the body breaks down carbamazepine. This might increase levels of carbamazepine in the body and increase its side effects.  Citalopram (Celexa) Citalopram is changed and broken down by the body. CBD might decrease how quickly the body breaks down citalopram. This might increase levels of citalopram in the body and increase its side effects.  Clobazam (Onfi) Clobazam is changed and broken down by the liver. CBD might decrease how quickly the liver breaks down clobazam. This might increase the effects and side effects of clobazam.  Eslicarbazepine (Aptiom) Eslicarbazepine is changed and broken down by the body. CBD might decrease how quickly the body breaks down eslicarbazepine. This might increase levels of eslicarbazepine in the body by a small amount.  Everolimus (Zostress) Everolimus is changed and  broken down by the body. CBD might decrease how quickly the body breaks down everolimus. This might increase levels of everolimus in the body.  Lithium Taking higher doses of CBD might increase levels of lithium. This can increase the risk of lithium toxicity.  Medications changed by the liver (Cytochrome P450 1A1 (CYP1A1) substrates) Some medications are changed and broken down by the liver. CBD might change how quickly the liver breaks down these medications. This could change the effects and side effects of these medications.  Medications changed by the liver (Cytochrome P450 1A2 (CYP1A2) substrates) Some medications are changed and broken down by the liver.  CBD might change how quickly the liver breaks down these medications. This could change the effects and side effects of these medications.  Medications changed by the liver (Cytochrome P450 1B1 (CYP1B1) substrates) Some medications are changed and broken down by the liver. CBD might change how quickly the liver breaks down these medications. This could change the effects and side effects of these medications.  Medications changed by the liver (Cytochrome P450 2A6 (CYP2A6) substrates) Some medications are changed and broken down by the liver. CBD might change how quickly the liver breaks down these medications. This could change the effects and side effects of these medications.  Medications changed by the liver (Cytochrome P450 2B6 (CYP2B6) substrates) Some medications are changed and broken down by the liver. CBD might change how quickly the liver breaks down these medications. This could change the effects and side effects of these medications.  Medications changed by the liver (Cytochrome P450 2C19 (CYP2C19) substrates) Some medications are changed and broken down by the liver. CBD might change how quickly the liver breaks down these medications. This could change the effects and side effects of these medications.  Medications  changed by the liver (Cytochrome P450 2C8 (CYP2C8) substrates) Some medications are changed and broken down by the liver. CBD might change how quickly the liver breaks down these medications. This could change the effects and side effects of these medications.  Medications changed by the liver (Cytochrome P450 2C9 (CYP2C9) substrates) Some medications are changed and broken down by the liver. CBD might change how quickly the liver breaks down these medications. This could change the effects and side effects of these medications.  Medications changed by the liver (Cytochrome P450 2D6 (CYP2D6) substrates) Some medications are changed and broken down by the liver. CBD might change how quickly the liver breaks down these medications. This could change the effects and side effects of these medications.  Medications changed by the liver (Cytochrome P450 2E1 (CYP2E1) substrates) Some medications are changed and broken down by the liver. CBD might change how quickly the liver breaks down these medications. This could change the effects and side effects of these medications.  Medications changed by the liver (Cytochrome P450 3A4 (CYP3A4) substrates) Some medications are changed and broken down by the liver. CBD might change how quickly the liver breaks down these medications. This could change the effects and side effects of these medications.  Medications changed by the liver (Glucuronidated drugs) Some medications are changed and broken down by the liver. CBD might change how quickly the liver breaks down these medications. This could change the effects and side effects of these medications.  Medications that decrease the breakdown of other medications by the liver (Cytochrome P450 2C19 (CYP2C19) inhibitors) CBD is changed and broken down by the liver. Some drugs decrease how quickly the liver changes and breaks down CBD. This could change the effects and side effects of CBD.  Medications that decrease  the breakdown of other medications in the liver (Cytochrome P450 3A4 (CYP3A4) inhibitors) CBD is changed and broken down by the liver. Some drugs decrease how quickly the liver changes and breaks down CBD. This could change the effects and side effects of CBD.  Medications that increase breakdown of other medications by the liver (Cytochrome P450 3A4 (CYP3A4) inducers) CBD is changed and broken down by the liver. Some drugs increase how quickly the liver changes and breaks down CBD. This could change the effects and side effects of CBD.  Medications that increase the breakdown  of other medications by the liver (Cytochrome P450 2C19 (CYP2C19) inducers) CBD is changed and broken down by the liver. Some drugs increase how quickly the liver changes and breaks down CBD. This could change the effects and side effects of CBD.  Methadone (Dolophine) Methadone is broken down by the liver. CBD might decrease how quickly the liver breaks down methadone. Taking cannabidiol along with methadone might increase the effects and side effects of methadone.  Rufinamide (Banzel) Rufinamide is changed and broken down by the body. CBD might decrease how quickly the body breaks down rufinamide. This might increase levels of rufinamide in the body by a small amount.  Sedative medications (CNS depressants) CBD might cause sleepiness and slowed breathing. Some medications, called sedatives, can also cause sleepiness and slowed breathing. Taking CBD with sedative medications might cause breathing problems and/or too much sleepiness.  Sirolimus (Rapamune) Sirolimus is changed and broken down by the body. CBD might decrease how quickly the body breaks down sirolimus. This might increase levels of sirolimus in the body.  Stiripentol (Diacomit) Stiripentol is changed and broken down by the body. CBD might decrease how quickly the body breaks down stiripentol. This might increase levels of stiripentol in the body and increase  its side effects.  Tacrolimus (Prograf) Tacrolimus is changed and broken down by the body. CBD might decrease how quickly the body breaks down tacrolimus. This might increase levels of tacrolimus in the body.  Tamoxifen (Soltamox) Tamoxifen is changed and broken down by the body. CBD might affect how quickly the body breaks down tamoxifen. This might affect levels of tamoxifen in the body.  Topiramate (Topamax) Topiramate is changed and broken down by the body. CBD might decrease how quickly the body breaks down topiramate. This might increase levels of topiramate in the body by a small amount.  Valproate Valproic acid can cause liver injury. Taking cannabidiol with valproic acid might increase the chance of liver injury. CBD and/or valproic acid might need to be stopped, or the dose might need to be reduced.  Warfarin (Coumadin) CBD might increase levels of warfarin, which can increase the risk for bleeding. CBD and/or warfarin might need to be stopped, or the dose might need to be reduced.  Zonisamide Zonisamide is changed and broken down by the body. CBD might decrease how quickly the body breaks down zonisamide. This might increase levels of zonisamide in the body by a small amount. (Last update: 08/30/2021) ____________________________________________________________________________________________  ____________________________________________________________________________________________  Drug Holidays (Slow)  What is a "Drug Holiday"? Drug Holiday: is the name given to the period of time during which a patient stops taking a medication(s) for the purpose of eliminating tolerance to the drug.  Benefits Improved effectiveness of opioids. Decreased opioid dose needed to achieve benefits. Improved pain with lesser dose.  What is tolerance? Tolerance: is the progressive decreased in effectiveness of a drug due to its repetitive use. With repetitive use, the body gets use to the  medication and as a consequence, it loses its effectiveness. This is a common problem seen with opioid pain medications. As a result, a larger dose of the drug is needed to achieve the same effect that used to be obtained with a smaller dose.  How long should a "Drug Holiday" last? You should stay off of the pain medicine for at least 14 consecutive days. (2 weeks)  Should I stop the medicine "cold [place]"? No. You should always coordinate with your Pain Specialist so that he/she can provide you with the correct  medication dose to make the transition as smoothly as possible.  How do I stop the medicine? Slowly. You will be instructed to decrease the daily amount of pills that you take by one (1) pill every seven (7) days. This is called a "slow downward taper" of your dose. For example: if you normally take four (4) pills per day, you will be asked to drop this dose to three (3) pills per day for seven (7) days, then to two (2) pills per day for seven (7) days, then to one (1) per day for seven (7) days, and at the end of those last seven (7) days, this is when the "Drug Holiday" would start.   Will I have withdrawals? By doing a "slow downward taper" like this one, it is unlikely that you will experience any significant withdrawal symptoms. Typically, what triggers withdrawals is the sudden stop of a high dose opioid therapy. Withdrawals can usually be avoided by slowly decreasing the dose over a prolonged period of time. If you do not follow these instructions and decide to stop your medication abruptly, withdrawals may be possible.  What are withdrawals? Withdrawals: refers to the wide range of symptoms that occur after stopping or dramatically reducing opiate drugs after heavy and prolonged use. Withdrawal symptoms do not occur to patients that use low dose opioids, or those who take the medication sporadically. Contrary to benzodiazepine (example: Valium, Xanax, etc.) or alcohol withdrawals  ("Delirium Tremens"), opioid withdrawals are not lethal. Withdrawals are the physical manifestation of the body getting rid of the excess receptors.  Expected Symptoms Early symptoms of withdrawal may include: Agitation Anxiety Muscle aches Increased tearing Insomnia Runny nose Sweating Yawning  Late symptoms of withdrawal may include: Abdominal cramping Diarrhea Dilated pupils Goose bumps Nausea Vomiting  Will I experience withdrawals? Due to the slow nature of the taper, it is very unlikely that you will experience any.  What is a slow taper? Taper: refers to the gradual decrease in dose.  (Last update: 01/20/2020) ____________________________________________________________________________________________   ______________________________________________________________________  Preparing for Procedure with Sedation  NOTICE: Due to recent regulatory changes, starting on January 30, 2021, procedures requiring intravenous (IV) sedation will no longer be performed at the [facility].  These types of procedures are required to be performed at [facility] ambulatory surgery facility.  We are very sorry for the inconvenience.  Procedure appointments are limited to planned procedures: No Prescription Refills. No disability issues will be discussed. No medication changes will be discussed.  Instructions: Oral Intake: Do not eat or drink anything for at least 8 hours prior to your procedure. (Exception: Blood Pressure Medication. See below.) Transportation: A driver is required. You may not drive yourself after the procedure. Blood Pressure Medicine: Do not forget to take your blood pressure medicine with a sip of water the morning of the procedure. If your Diastolic (lower reading) is above 100 mmHg, elective cases will be cancelled/rescheduled. Blood thinners: These will need to be stopped for procedures. Notify our staff if you are taking any blood thinners. Depending on  which one you take, there will be specific instructions on how and when to stop it. Diabetics on insulin: Notify the staff so that you can be scheduled 1st case in the morning. If your diabetes requires high dose insulin, take only  of your normal insulin dose the morning of the procedure and notify the staff that you have done so. Preventing infections: Shower with an antibacterial soap the morning of your procedure. Build-up  your immune system: Take 1000 mg of Vitamin C with every meal (3 times a day) the day prior to your procedure. Antibiotics: Inform the staff if you have a condition or reason that requires you to take antibiotics before dental procedures. Pregnancy: If you are pregnant, call and cancel the procedure. Sickness: If you have a cold, fever, or any active infections, call and cancel the procedure. Arrival: You must be in the facility at least 30 minutes prior to your scheduled procedure. Children: Do not bring children with you. Dress appropriately: There is always the possibility that your clothing may get soiled. Valuables: Do not bring any jewelry or valuables.  Reasons to call and reschedule or cancel your procedure: (Following these recommendations will minimize the risk of a serious complication.) Surgeries: Avoid having procedures within 2 weeks of any surgery. (Avoid for 2 weeks before or after any surgery). Flu Shots: Avoid having procedures within 2 weeks of a flu shots. (Avoid for 2 weeks before or after immunizations). Barium: Avoid having a procedure within 7-10 days after having had a radiological study involving the use of radiological contrast. (Myelograms, Barium swallow or enema study). Heart attacks: Avoid any elective procedures or surgeries for the initial 6 months after a "Myocardial Infarction" (Heart Attack). Blood thinners: It is imperative that you stop these medications before procedures. Let us know if you if you take any blood thinner.  Infection:  Avoid procedures during or within two weeks of an infection (including chest colds or gastrointestinal problems). Symptoms associated with infections include: Localized redness, fever, chills, night sweats or profuse sweating, burning sensation when voiding, cough, congestion, stuffiness, runny nose, sore throat, diarrhea, nausea, vomiting, cold or Flu symptoms, recent or current infections. It is specially important if the infection is over the area that we intend to treat. Heart and lung problems: Symptoms that may suggest an active cardiopulmonary problem include: cough, chest pain, breathing difficulties or shortness of breath, dizziness, ankle swelling, uncontrolled high or unusually low blood pressure, and/or palpitations. If you are experiencing any of these symptoms, cancel your procedure and contact your primary care physician for an evaluation.  Remember:  Regular Business hours are:  Monday to Thursday 8:00 AM to 4:00 PM  Provider's Schedule: [name], MD:  Procedure days: Tuesday and Thursday 7:30 AM to 4:00 PM  Gillis Santa, MD:  Procedure days: Monday and Wednesday 7:30 AM to 4:00 PM ______________________________________________________________________  Epidural Steroid Injection  An epidural steroid injection is a shot of steroid medicine and numbing medicine that is given into the space between the spinal cord and the bones of the back (epidural space). The shot helps relieve pain caused by an irritated or swollen nerve root. The amount of pain relief you get from the injection depends on what is causing the nerve to be swollen and irritated, and how long your pain lasts. You are more likely to benefit from this injection if your pain is strong and comes on suddenly rather than if you have had long-term (chronic) pain. Tell a health care provider about: Any allergies you have. All medicines you are taking, including vitamins, herbs, eye drops, creams, and  over-the-counter medicines. Any problems you or family members have had with anesthetic medicines. Any blood disorders you have. Any surgeries you have had. Any medical conditions you have. Whether you are pregnant or may be pregnant. What are the risks? Generally, this is a safe procedure. However, problems may occur, including: Headache. Bleeding. Infection. Allergic reaction to medicines. Nerve  damage. What happens before the procedure? Staying hydrated Follow instructions from your health care provider about hydration, which may include: Up to 2 hours before the procedure - you may continue to drink clear liquids, such as water, clear fruit juice, black coffee, and plain tea. Eating and drinking restrictions Follow instructions from your health care provider about eating and drinking, which may include: 8 hours before the procedure - stop eating heavy meals or foods, such as meat, fried foods, or fatty foods. 6 hours before the procedure - stop eating light meals or foods, such as toast or cereal. 6 hours before the procedure - stop drinking milk or drinks that contain milk. 2 hours before the procedure - stop drinking clear liquids. Medicines You may be given medicines to lower anxiety. Ask your health care provider about: Changing or stopping your regular medicines. This is especially important if you are taking diabetes medicines or blood thinners. Taking medicines such as aspirin and ibuprofen. These medicines can thin your blood. Do not take these medicines unless your health care provider tells you to take them. Taking over-the-counter medicines, vitamins, herbs, and supplements. General instructions Ask your health care provider what steps will be taken to prevent infection. Plan to have a responsible adult take you home from the hospital or clinic. If you will be going home right after the procedure, plan to have a responsible adult care for you for the time you are told.  This is important. What happens during the procedure? An IV will be inserted into one of your veins. You will be given one or more of the following: A medicine to help you relax (sedative). A medicine to numb the area (local anesthetic). You will be asked to lie on your abdomen or sit. The injection site will be cleaned. A needle will be inserted through your skin into the epidural space. This may cause you some discomfort. An X-ray machine will be used to guide the needle as close as possible to the affected nerve. A steroid medicine and a local anesthetic will be injected into the epidural space. The needle and IV will be removed. A bandage (dressing) will be put over the injection site. The procedure may vary among health care providers and hospitals. What can I expect after the procedure? Your blood pressure, heart rate, breathing rate, and blood oxygen level will be monitored until you leave the hospital or clinic. Your arm or leg may feel weak or numb for a few hours. The injection site may feel sore. Follow these instructions at home: Injection site care You may remove the bandage (dressing) after 24 hours. Check your injection site every day for signs of infection. Check for: Redness, swelling, or pain. Fluid or blood. Warmth. Pus or a bad smell. Managing pain, stiffness, and swelling For 24 hours after the procedure: Avoid using heat on the injection site. Do not take baths, swim, or use a hot tub until your health care provider approves. Ask your health care provider if you may take showers. You may only be allowed to take sponge baths. If directed, put ice on the injection site. To do this: Put ice in a plastic bag. Place a towel between your skin and the bag. Leave the ice on for 20 minutes, 2-3 times a day.  Activity If you were given a sedative during the procedure, it can affect you for several hours. Do not drive or operate machinery until your health care provider  says that it is safe.  Return to your normal activities as told by your health care provider. Ask your health care provider what activities are safe for you. General instructions Take over-the-counter and prescription medicines only as told by your health care provider. Drink enough fluid to keep your urine pale yellow. Keep all follow-up visits as told by your health care provider. This is important. Contact a health care provider if: You have any of these signs of infection: Redness, swelling, or pain around your injection site. Fluid or blood coming from your injection site. Warmth coming from your injection site. Pus or a bad smell coming from your injection site. A fever. You continue to have pain and soreness around the injection site, even after taking over-the-counter pain medicine. You have severe, sudden, or lasting nausea or vomiting. Get help right away if: You have severe pain at the injection site that is not relieved by medicines. You develop a severe headache or a stiff neck. You become sensitive to light. You have any new numbness or weakness in your legs or arms. You lose control of your bladder or bowel movements. You have trouble breathing. Summary An epidural steroid injection is a shot of steroid medicine and numbing medicine that is given into the epidural space. The shot helps relieve pain caused by an irritated or swollen nerve root. You are more likely to benefit from this injection if your pain is strong and comes on suddenly rather than if you have had chronic pain. This information is not intended to replace advice given to you by your health care provider. Make sure you discuss any questions you have with your health care provider. Document Revised: 05/18/2021 Document Reviewed: 12/29/2018 [employer] Patient Education  [place].

## 2022-01-29 NOTE — Progress Notes (Signed)
Nursing Pain Medication Assessment:  Safety precautions to be maintained throughout the outpatient stay will include: orient to surroundings, keep bed in low position, maintain call bell within reach at all times, provide assistance with transfer out of bed and ambulation.  Medication Inspection Compliance: Pill count conducted under aseptic conditions, in front of the patient. Neither the pills nor the bottle was removed from the patient's sight at any time. Once count was completed pills were immediately returned to the patient in their original bottle.  Medication: Oxycodone/APAP Pill/Patch Count: 88/90 Pill/Patch Appearance: Markings consistent with prescribed medication Bottle Appearance: Standard pharmacy container. Clearly labeled. Filled Date: 07 / 28 / 2023 Last Medication intake:  Today

## 2022-01-31 DIAGNOSIS — M2042 Other hammer toe(s) (acquired), left foot: Secondary | ICD-10-CM | POA: Diagnosis not present

## 2022-01-31 DIAGNOSIS — E1142 Type 2 diabetes mellitus with diabetic polyneuropathy: Secondary | ICD-10-CM | POA: Diagnosis not present

## 2022-01-31 DIAGNOSIS — Q667 Congenital pes cavus, unspecified foot: Secondary | ICD-10-CM | POA: Diagnosis not present

## 2022-01-31 DIAGNOSIS — M2041 Other hammer toe(s) (acquired), right foot: Secondary | ICD-10-CM | POA: Diagnosis not present

## 2022-01-31 DIAGNOSIS — M216X1 Other acquired deformities of right foot: Secondary | ICD-10-CM | POA: Diagnosis not present

## 2022-01-31 DIAGNOSIS — L97522 Non-pressure chronic ulcer of other part of left foot with fat layer exposed: Secondary | ICD-10-CM | POA: Diagnosis not present

## 2022-01-31 DIAGNOSIS — L03032 Cellulitis of left toe: Secondary | ICD-10-CM | POA: Diagnosis not present

## 2022-01-31 DIAGNOSIS — G894 Chronic pain syndrome: Secondary | ICD-10-CM | POA: Diagnosis not present

## 2022-01-31 DIAGNOSIS — M216X2 Other acquired deformities of left foot: Secondary | ICD-10-CM | POA: Diagnosis not present

## 2022-02-09 ENCOUNTER — Other Ambulatory Visit: Payer: Self-pay | Admitting: *Deleted

## 2022-02-09 NOTE — Patient Outreach (Signed)
  Care Coordination   02/09/2022 Name: Melinda [name] MRN: [id number] DOB: 1955-08-09   Care Coordination Outreach Attempts:  An unsuccessful telephone outreach was attempted today to offer the patient information about available care coordination services as a benefit of their health plan.   Follow Up Plan:  No further outreach attempts will be made at this time. We have been unable to contact the patient to offer or enroll patient in care coordination services  Encounter Outcome:  Pt. Refused  Care Coordination Interventions Activated:  No   Care Coordination Interventions:  No, not indicated    Emelia Loron RN, BSN [place] [phone] [email or web address]

## 2022-02-10 ENCOUNTER — Encounter: Payer: Self-pay | Admitting: Internal Medicine

## 2022-02-11 ENCOUNTER — Ambulatory Visit
Admission: EM | Admit: 2022-02-11 | Discharge: 2022-02-11 | Disposition: A | Discharge status: Home / Self Care | Payer: Medicare Other | Attending: Family Medicine | Admitting: Family Medicine

## 2022-02-11 DIAGNOSIS — U071 COVID-19: Secondary | ICD-10-CM | POA: Diagnosis not present

## 2022-02-11 MED ORDER — MOLNUPIRAVIR 200 MG PO CAPS: 4.0000 | ORAL_CAPSULE | Freq: Two times a day (BID) | ORAL | 0 refills | Status: AC

## 2022-02-11 MED ORDER — ALBUTEROL SULFATE HFA 108 (90 BASE) MCG/ACT IN AERS: 2.0000 | INHALATION_SPRAY | RESPIRATORY_TRACT | 0 refills | Status: DC | PRN

## 2022-02-11 MED ORDER — BENZONATATE 100 MG PO CAPS: 100.0000 mg | ORAL_CAPSULE | Freq: Three times a day (TID) | ORAL | 0 refills | Status: DC

## 2022-02-11 NOTE — ED Provider Notes (Signed)
[facility]    CSN: [id number] Arrival date & time: 02/11/22  1021      History   Chief Complaint Chief Complaint  Patient presents with   Covid Positive    HPI Melinda [name] is a 66 y.o. female.   HPI Melinda [name] reports that she was exposed to Melinda [name] at a conference at [facility] last week.  Yesterday, she developed a dry cough that turns productive.  Denies fever, chills, nausea vomiting or diarrhea or headache.  Endorses cough with some sputum production, nasal congestion, myalgias.  She took advil this morning.  She is COVID vaccinated and boosted.  Has had COVID 1 previous time when she took Paxlovid.  Her partner's COVID test was negative.   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) 05/30/2010   Amputation of second toe, right, traumatic ([place]) 09/2017   Anginal pain (HCC)    Anxiety    Aortic atherosclerosis ([place])    Bilateral carotid artery stenosis 12/24/2014   a.) Doppler 12/24/2014: 60-79% BILATERAL ICA. b.) Doppler 06/28/2015: 40-59% BILATERAL ICA. c.) Dopplers 04/23/2019, 04/22/2020, 10/20/2021: 66-44% RICA and 0-34% LICA.   Blue toes    2nd toe on right foot, will get appt.   Bulging disc    CAD (coronary artery disease) 2009   a.) PCI with placement of a 15 x 30 mm Promus DES to mLAD. b.) LHC 02/27/2011: EF 60%; no sig CAD; mild lum irreg at ostium of diagnoal (jailed by stent).   Chicken pox    Chronic fatigue    Chronic, continuous use of opioids    CKD (chronic kidney disease), stage III (HCC)    Degenerative disc disease    Depression    Diverticulosis    Facet joint disease    Failed cervical fusion     Fibromyalgia    Hiatal hernia    Hyperlipidemia    IBS (irritable bowel syndrome)    MCL deficiency, knee    MRSA (methicillin resistant staph aureus) culture positive 2011   GREAT TOE RIGHT FOOT   Neuropathy 04/18/2010   Orthostatic hypotension 01/2020   a.) multiple pre-syncopal episodes; takes proamatine   OSA on CPAP 08/17/2003   Osteoarthritis    a.) back, neck, hands, knees   Restless leg syndrome    Sepsis ([place]) 09/2013   Spinal headache 11/07/2021   Spinal stenosis    T2DM (type 2 diabetes mellitus) ([place])    Wolff-Parkinson-White (WPW) syndrome 1992   a.) s/p ablations; unsuccessful. Now has rare episodes    Patient Active Problem List   Diagnosis Date Noted   Abnormal MRI, cervical spine (11/21/2020) 01/29/2022   S/P hardware removal ([employer]tronic intrathecal pump) 12/26/2021   Spinal headache 11/07/2021   Bilateral carotid artery stenosis 10/04/2021   Orthostasis 09/04/2021   Use of cannabis (CBD) 06/20/2021   Chronic sacroiliac joint pain (Bilateral) 04/12/2021   Osteoarthritis of hips (Bilateral) 02/21/2021   Iron deficiency anemia due to chronic blood loss 02/21/2021   Irritable bowel disease 02/21/2021   Class 1 obesity due to excess calories with body mass index (BMI) of 31.0 to 31.9 in adult 12/21/2020   Chronic  use of opiate for therapeutic purpose 11/08/2020   DDD (degenerative disc disease), cervical 11/08/2020   Chronic postprocedural seroma of right abdominal wall following intrathecal pump implant 10/10/2020   Recurrent right knee instability 09/06/2020   Pain and numbness of upper extremity (Bilateral) 04/12/2020   Cervicalgia 04/12/2020   History of carpal tunnel surgery (Bilateral) 04/12/2020   Malposition of intrathecal infusion catheter 03/11/2020   Malfunction of intrathecal infusion pump 03/09/2020   Thoracic facet hypertrophy/arthropathy (Multilevel) (Bilateral) 12/09/2019   Thoracic facet syndrome (Multilevel) (Bilateral) 12/09/2019   Chronic  low back pain (Bilateral) w/o sciatica 06/23/2019   DDD (degenerative disc disease), lumbosacral 06/23/2019   CKD (chronic kidney disease) stage 3, GFR 30-59 ml/min 09/04/2018   Chronic right-sided low back pain w/o sciatica from IT catheter anchor. 08/06/2018   Disorder of skeletal system 05/06/2018   Problems influencing health status 05/06/2018   Abnormal MRI, lumbar spine (11/18/2019) 05/06/2018   Lumbar facet hypertrophy (Multilevel) 05/06/2018   Lumbar foraminal stenosis 05/06/2018   Lumbar lateral recess stenosis 05/06/2018   Lumbar spinal stenosis w/ neurogenic claudication 04/09/2018   Idiopathic scoliosis 03/26/2018   Degeneration of lumbar intervertebral disc 03/26/2018   Lumbar spondylosis 01/23/2018   Obstructive sleep apnea syndrome 08/09/2017   Osteoarthritis of knee (Bilateral) (R>L) 06/08/2016   Long term current use of opiate analgesic 04/21/2016   Mixed anxiety and depressive disorder 09/11/2015   Opioid-induced constipation (OIC) 07/27/2015   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   Chronic neck pain 05/24/2015   Chronic low back pain (Bilateral) (L>R) w/ sciatica (Right) 05/24/2015   Epidural fibrosis 05/24/2015   Cervical spondylosis 05/24/2015   Chronic lower extremity pain (Left) 05/24/2015   Chronic lumbar radicular pain (Left) 05/24/2015   Neuropathic pain 05/24/2015   Lumbar facet syndrome 05/24/2015   Fibromyalgia 05/24/2015   Opiate use (22.5 MME/Day) (oral) 05/24/2015   Encounter for chronic pain management 05/24/2015   Type 2 diabetes mellitus with diabetic mononeuropathy, without long-term current use of insulin ([place]) 04/27/2014   Pure hypercholesterolemia 03/02/2014   CAD (coronary artery disease) 10/04/2013   Atherosclerotic heart disease of native coronary artery without angina pectoris 10/04/2013   Chronic pain syndrome 04/18/2010    Past Surgical History:  Procedure Laterality Date    AMPUTATION TOE Right 02/01/2017   Procedure: AMPUTATION TOE-RIGHT 2ND MPJ;  Surgeon: Albertine Patricia, DPM;  Location: [facility];  Service: Podiatry;  Laterality: Right;   ANTERIOR CERVICAL DECOMP/DISCECTOMY FUSION N/A 07/28/2014   Procedure: ANTERIOR CERVICAL DECOMPRESSION/DISCECTOMY FUSION CERVICAL 3-4,4-5,5-6 LEVELS WITH INSTRUMENTATION AND ALLOGRAFT;  Surgeon: Sinclair Ship, MD;  Location: [place];  Service: Orthopedics;  Laterality: N/A;  Anterior cervical decompression fusion, cervical 3-4, cervical 4-5, cervical 5-6 with instrumentation and allograft   BACK SURGERY     X 3 1979, 1994, 1995   BLEPHAROPLASTY Bilateral 2013   CARDIAC ELECTROPHYSIOLOGY STUDY AND ABLATION N/A 1992   CARPAL TUNNEL RELEASE Right    CHOLECYSTECTOMY  2003   CORONARY ANGIOPLASTY WITH STENT PLACEMENT N/A 2009   Procedure: CORONARY ANGIOPLASTY WITH STENT PLACEMENT   ENDOMETRIAL ABLATION  2007   EXCISION PARTIAL PHALANX Left 09/28/2021   Procedure: EXCISION PARTIAL PHALANX - FIRST;  Surgeon: Caroline More, DPM;  Location: [place];  Service: Podiatry;  Laterality: Left;   GALLBLADDER SURGERY     HAMMER TOE SURGERY Right 10/17/2017   Procedure: HAMMER TOE CORRECTION-4TH TOE;  Surgeon: Albertine Patricia, DPM;  Location: [place];  Service: Podiatry;  Laterality: Right;  LMA- WITH LOCAL Diabetic - diet controlled   HAND SURGERY Left    INFUSION PUMP IMPLANTATION     X2 with morphine and baclofen   INTRATHECAL PUMP REVISION N/A 04/25/2018   Procedure: Intrathecal pump replacement;  Surgeon: Clydell Hakim, MD;  Location: [place];  Service: Neurosurgery;  Laterality: N/A;  right   INTRATHECAL PUMP REVISION Right 04/25/2018   INTRATHECAL PUMP REVISION N/A 12/12/2018   Procedure: Intrathecal pump revision with exploration of pocket;  Surgeon: Clydell Hakim, MD;  Location: [place];  Service: Neurosurgery;  Laterality: N/A;  Intrathecal pump revision with exploration of pocket   INTRATHECAL PUMP  REVISION N/A 03/11/2020   Procedure: INTRATHECAL PUMP REVISION;  Surgeon: [name], MD;  Location: [facility];  Service: Neurosurgery;  Laterality: N/A;   INTRATHECAL PUMP REVISION Right 12/14/2021   Procedure: INTRATHECAL PUMP REMOVAL;  Surgeon: [name], MD;  Location: [facility];  Service: Neurosurgery;  Laterality: Right;   IRRIGATION AND DEBRIDEMENT FOOT Right 02/23/2017   Procedure: IRRIGATION AND DEBRIDEMENT FOOT-right foot;  Surgeon: Samara Deist, DPM;  Location: [facility];  Service: Podiatry;  Laterality: Right;   KNEE SURGERY Right 05/24/2020   LEFT HEART CATH AND CORONARY ANGIOGRAPHY Left 02/27/2011   Procedure: LEFT HEART CATH AND CORONARY ANGIOGRAPHY   PAIN PUMP REVISION N/A 08/15/2018   Procedure: Intrathecal PUMP REVISION;  Surgeon: Clydell Hakim, MD;  Location: [place];  Service: Neurosurgery;  Laterality: N/A;  INTRATHECAL PUMP REVISION   PLANTAR FASCIA SURGERY Bilateral    TOE AMPUTATION Right    RIGHT great toe (1st digit)   TOE AMPUTATION Right    a.) RIGHT second toe (2nd digit)   TOE SURGERY     then revision 8/18   TOTAL KNEE ARTHROPLASTY Right 05/24/2020   Procedure: RIGHT TOTAL KNEE ARTHROPLASTY;  Surgeon: Melrose Nakayama, MD;  Location: [facility];  Service: Orthopedics;  Laterality: Right;   TOTAL KNEE REVISION Right 09/06/2020   : RIGHT KNEE POLY REVISION;  Melrose Nakayama, MD) PLAN TO DISCHARGE FROM PACU   WOUND DEBRIDEMENT Left 09/28/2021   Procedure: DEBRIDEMENT SKIN, SUBCUTANEOUS TISSUE - 18841;  Surgeon: Caroline More, DPM;  Location: [place];  Service: Podiatry;  Laterality: Left;  Diabetic    OB History   No obstetric history on file.      Home Medications    Prior to Admission medications   Medication Sig Start Date End Date Taking? Authorizing Provider  albuterol (VENTOLIN HFA) 108 (90 Base) MCG/ACT inhaler Inhale 2 puffs into the lungs every 4 (four) hours as needed for wheezing or shortness of breath. 02/11/22  Yes  Yolandra Habig, DO  benzonatate (TESSALON) 100 MG capsule Take 1 capsule (100 mg total) by mouth every 8 (eight) hours. 02/11/22  Yes Pooja Camuso, DO  molnupiravir EUA (LAGEVRIO) 200 MG CAPS capsule Take 4 capsules (800 mg total) by mouth 2 (two) times daily for 5 days. 02/11/22 02/16/22 Yes Mylani Gentry, DO  ACCU-CHEK GUIDE test strip USE 1 EACH BY OTHER ROUTE 3 (THREE) TIMES DAILY. USE AS INSTRUCTE 02/10/21   Jearld Fenton, NP  Accu-Chek Softclix Lancets lancets USE TO CHECK BLOOD SUGAR 1 TIME DAILY 08/04/19   Jearld Fenton, NP  Ascorbic Acid (VITAMIN C) 1000 MG tablet Take 1,000 mg by mouth daily.    [provider]  ASPIRIN 81 PO Take 81 mg by mouth daily.    [provider]  Biotin 10000 MCG TABS Take 10,000 mcg by mouth daily.  [provider]  Blood Glucose Calibration (ACCU-CHEK GUIDE CONTROL) LIQD 1 each by In Vitro route as needed. 09/19/20   Jearld Fenton, NP  buPROPion (WELLBUTRIN XL) 150 MG 24 hr tablet TAKE 1 TABLET BY MOUTH EVERY DAY 01/25/22   Jearld Fenton, NP  CALCIUM-MAGNESIUM-ZINC PO Take 3 capsules by mouth daily.    [provider]  CINNAMON PO Take 1,000 mg by mouth daily.    [provider]  Coenzyme Q10 (CO Q 10 PO) Take 1 tablet by mouth daily.    [provider]  ezetimibe (ZETIA) 10 MG tablet TAKE 1 TABLET BY MOUTH EVERY DAY 12/11/21   Jearld Fenton, NP  fludrocortisone (FLORINEF) 0.1 MG tablet Take 1 tablet (0.1 mg total) by mouth daily. Patient taking differently: Take 0.1 mg by mouth every morning. 08/28/21   Jerline Pain, MD  glucosamine-chondroitin 500-400 MG tablet Take 2 tablets by mouth daily.     [provider]  Insulin Pen Needle 31G X 5 MM MISC BD Pen Needles- brand specific Inject insulin via insulin pen 6 x daily 12/28/20   Jearld Fenton, NP  Magnesium 400 MG CAPS Take 400 mg by mouth at bedtime.    [provider]  midodrine (PROAMATINE) 5 MG tablet Take 1 tablet (5 mg  total) by mouth 3 (three) times daily with meals. 01/22/22   Jerline Pain, MD  nitroGLYCERIN (NITROSTAT) 0.4 MG SL tablet PLACE 1 TABLET UNDER THE TONGUE EVERY 5 (FIVE) MINUTES AS NEEDED FOR CHEST PAIN. 03/01/21   Jerline Pain, MD  Omega-3 Fatty Acids (FISH OIL) 1200 MG CAPS Take 1,200 mg by mouth in the morning.     [provider]  oxyCODONE-acetaminophen (PERCOCET) 5-325 MG tablet Take 1 tablet by mouth every 8 (eight) hours as needed for severe pain. Must last 30 days. 02/23/22 03/25/22  [name], MD  oxyCODONE-acetaminophen (PERCOCET) 5-325 MG tablet Take 1 tablet by mouth every 8 (eight) hours as needed for severe pain. Must last 30 days. 03/25/22 04/24/22  [name], MD  oxyCODONE-acetaminophen (PERCOCET) 5-325 MG tablet Take 1 tablet by mouth every 8 (eight) hours as needed for severe pain. Must last 30 days. 04/24/22 05/24/22  [name], MD  OZEMPIC, 0.25 OR 0.5 MG/DOSE, 2 MG/3ML SOPN Inject 0.5 mg into the skin once a week. Patient taking differently: Inject 0.5 mg into the skin once a week. Wednesday 12/08/21   Jearld Fenton, NP  PARoxetine (PAXIL) 20 MG tablet TAKE 1/2 TABLET BY MOUTH DAILY IN THE MORNING 01/12/22   Jearld Fenton, NP  polyethylene glycol (MIRALAX / GLYCOLAX) packet Take 17 g by mouth daily.    [provider]  pregabalin (LYRICA) 150 MG capsule TAKE 1 CAPSULE BY MOUTH 3 TIMES DAILY. 12/22/21   Jearld Fenton, NP  rosuvastatin (CRESTOR) 20 MG tablet TAKE 1 TABLET BY MOUTH EVERY DAY 09/06/21   Jerline Pain, MD  TART CHERRY PO Take 1 tablet by mouth daily at 6 (six) AM.    [provider]  tiZANidine (ZANAFLEX) 4 MG tablet TAKE 1 TABLET (4 MG TOTAL) BY MOUTH EVERY 8 (EIGHT) HOURS AS NEEDED FOR MUSCLE SPASMS 01/25/22 04/25/22  Jearld Fenton, NP  vitamin E 180 MG (400 UNITS) capsule Take 400 Units by mouth daily.    [provider]    Family History Family History  Problem Relation Age of Onset   Cancer  Mother    Lung cancer Mother  Arthritis Father    Heart disease Father    Diabetes Father    Dementia Maternal Grandmother    Alcohol abuse Paternal Grandfather    Heart disease Paternal Grandfather    Stroke Paternal Grandfather     Social History Social History   Tobacco Use   Smoking status: Former    Packs/day: 1.50    Years: 32.00    Total pack years: 48.00    Types: Cigarettes    Quit date: 07/22/2007    Years since quitting: 14.5   Smokeless tobacco: Never  Vaping Use   Vaping Use: Never used  Substance Use Topics   Alcohol use: Yes    Comment: occ - Holidays   Drug use: Never    Comment: prescribed pain pump and oxy     Allergies   Patient has no known allergies.   Review of Systems Review of Systems: :negative unless otherwise stated in HPI.      Physical Exam Triage Vital Signs ED Triage Vitals  Enc Vitals Group     BP 02/11/22 1030 (!) 161/73     Pulse Rate 02/11/22 1030 60     Resp --      Temp 02/11/22 1030 98.2 F (36.8 C)     Temp Source 02/11/22 1030 Oral     SpO2 02/11/22 1030 97 %     Weight 02/11/22 1027 205 lb (93 kg)     Height 02/11/22 1027 '5\' 9"'$  (1.753 m)     Head Circumference --      Peak Flow --      Pain Score 02/11/22 1027 0     Pain Loc --      Pain Edu? --      Excl. in [place]? --    No data found.  Updated Vital Signs BP (!) 161/73 (BP Location: Left Arm)   Pulse 60   Temp 98.2 F (36.8 C) (Oral)   Ht '5\' 9"'$  (1.753 m)   Wt 93 kg   LMP  (LMP Unknown)   SpO2 97%   BMI 30.27 kg/m   Visual Acuity Right Eye Distance:   Left Eye Distance:   Bilateral Distance:    Right Eye Near:   Left Eye Near:    Bilateral Near:     Physical Exam GEN:     alert, non-toxic appearing female in no distress    HENT:  mucus membranes moist EYES:   pupils equal and reactive, no scleral injection NECK:  normal ROM  RESP:  clear to auscultation bilaterally, no increased work of breathing , no retractions or tugging, no wheezes  rales or rhonchi CVS:   regular rate and rhythm Skin:   warm and dry    UC Treatments / Results  Labs (all labs ordered are listed, but only abnormal results are displayed) Labs Reviewed - No data to display  EKG   Radiology No results found.  Procedures Procedures (including critical care time)  Medications Ordered in UC Medications - No data to display  Initial Impression / Assessment and Plan / UC Course  I have reviewed the triage vital signs and the nursing notes.  Pertinent labs & imaging results that were available during my care of the patient were reviewed by me and considered in my medical decision making (see chart for details).       COVID exposure Patient is a 66 year old female who presents after testing positive for at home yesterday.  She is interested in treatment  for COVID.  She has had COVID once before.  She is COVID vaccinated and boosted.  Overall she is well-appearing, well-hydrated and in no respiratory distress.  Discussed symptomatic treatment.  Her cough bothers her the most and we will give her Tessalon Perles and albuterol to help with chest tightness.  Pulmonary exam she has equal aeration bilaterally.  After discussion with her partner she is interested in [place].  Rx sent to pharmacy.  Work note offered but has not needed.  Discussed ED and return precautions and she voiced understanding.  Quarantine instructions provided.  Discussed MDM, treatment plan and plan for follow-up with patient/parent who agrees with plan.     Final Clinical Impressions(s) / UC Diagnoses   Final diagnoses:  [id number]     Discharge Instructions      Your home test for COVID-19 was positive, meaning that you were infected with the novel coronavirus and could give the germ to others.  Please continue isolation at home for at least 5 days since the start of your symptoms. Once you complete your 5 day quarantine, you may return to normal activities as  long as you've not had a fever for over 24 hours(without taking fever reducing medicine) and your symptoms are improving. Wear a mask until Day 11.   Please continue good preventive care measures, including:  frequent hand-washing, avoid touching your face, cover coughs/sneezes, stay out of crowds and keep a 6 foot distance from others.    Go to the nearest hospital emergency room if fever/cough/breathlessness are severe or illness seems like a threat to life.       ED Prescriptions     Medication Sig Dispense Auth. Provider   molnupiravir EUA (LAGEVRIO) 200 MG CAPS capsule Take 4 capsules (800 mg total) by mouth 2 (two) times daily for 5 days. 40 capsule Mareon Robinette, DO   benzonatate (TESSALON) 100 MG capsule Take 1 capsule (100 mg total) by mouth every 8 (eight) hours. 21 capsule Timiya Howells, DO   albuterol (VENTOLIN HFA) 108 (90 Base) MCG/ACT inhaler Inhale 2 puffs into the lungs every 4 (four) hours as needed for wheezing or shortness of breath. 1 each Lyndee Hensen, DO      PDMP not reviewed this encounter.   Lyndee Hensen, DO 02/11/22 1119

## 2022-02-11 NOTE — Progress Notes (Deleted)
PROVIDER NOTE: Interpretation of information contained herein should be left to medically-trained personnel. Specific patient instructions are provided elsewhere under "Patient Instructions" section of medical record. This document was created in part using STT-dictation technology, any transcriptional errors that may result from this process are unintentional.  Patient: Melinda [name] Type: Established DOB: 14-Oct-1955 MRN: [id number] PCP: Jearld Fenton, NP  Service: Procedure DOS: 02/15/2022 Setting: Ambulatory Location: Ambulatory outpatient facility Delivery: Face-to-face Provider: Gaspar Cola, MD Specialty: Interventional Pain Management Specialty designation: 09 Location: Outpatient facility Ref. Prov.: [name], MD   Procedure [facility] Interventional Pain Management )   Type: Cervical Epidural Steroid injection (ESI) (Interlaminar) #1  Laterality: Left  Level: C7-T1 Imaging: Fluoroscopy-assisted DOS: 02/15/2022  Performed by: [name], MD Anesthesia: Local anesthesia (1-2% Lidocaine) Anxiolysis: None                 Sedation:                .    Purpose: Diagnostic/Therapeutic Indications: Cervicalgia, cervical radicular pain, degenerative disc disease, severe enough to impact quality of life or function. No diagnosis found. NAS-11 score:   Pre-procedure:  /10   Post-procedure:  /10      Pre-Procedure Preparation  Monitoring: As per clinic protocol. Respiration, ETCO2, SpO2, BP, heart rate and rhythm monitor placed and checked for adequate function  Risk Assessment: Vitals:  NAT:[id number] body mass index is 30.27 kg/m as calculated from the following:   Height as of 02/11/22: '5\' 9"'$  (1.753 m).   Weight as of 02/11/22: 205 lb (93 kg)., Rate:  , BP: , Resp: , Temp: , SpO2:   Allergies: She has No Known Allergies.  Precautions: None required  Blood-thinner(s): None at this time  Coagulopathies: Reviewed. None identified.   Active Infection(s):  Reviewed. None identified. Ms. Canal is afebrile   Location setting: Procedure suite Position: Prone, on modified reverse trendelenburg to facilitate breathing, with head in head-cradle. Pillows positioned under chest (below chin-level) with cervical spine flexed. Safety Precautions: Patient was assessed for positional comfort and pressure points before starting the procedure. Prepping solution: DuraPrep (Iodine Povacrylex [0.7% available iodine] and Isopropyl Alcohol, 74% w/w) Prep Area: Entire  cervicothoracic region Approach: percutaneous, paramedial Intended target: Posterior cervical epidural space Materials: Tray: Epidural Needle(s): Epidural (Tuohy) Qty: 1 Length: (72m) 3.5-inch Gauge: 17G   No orders of the defined types were placed in this encounter.   No orders of the defined types were placed in this encounter.    Time-out:   I initiated and conducted the "Time-out" before starting the procedure, as per protocol. The patient was asked to participate by confirming the accuracy of the "Time Out" information. Verification of the correct person, site, and procedure were performed and confirmed by me, the nursing staff, and the patient. "Time-out" conducted as per Joint Commission's Universal Protocol (UP.01.01.01). Procedure checklist: Completed   H&P (Pre-op  Assessment)  Ms. CJerkinsis a 66y.o. (year old), female patient, seen today for interventional treatment. She  has a past surgical history that includes Toe amputation (Right); Hand surgery (Left); Gallbladder surgery; Endometrial ablation (2007); Cardiac electrophysiology study and ablation (N/A, 1992); Plantar fascia surgery (Bilateral); Toe amputation (Right); Infusion pump implantation; Back surgery; Cholecystectomy (2003); Anterior cervical decomp/discectomy fusion (N/A, 07/28/2014); Carpal tunnel release (Right); Blepharoplasty (Bilateral, 2013); Amputation toe (Right, 02/01/2017); Irrigation and debridement foot  (Right, 02/23/2017); Toe Surgery; Hammer toe surgery (Right, 10/17/2017); Intrathecal pump revision (N/A, 04/25/2018); Intrathecal pump revision (Right, 04/25/2018); Coronary angioplasty with stent (  N/A, 2009); Pain pump revision (N/A, 08/15/2018); Intrathecal pump revision (N/A, 12/12/2018); Intrathecal pump revision (N/A, 03/11/2020); Knee surgery (Right, 05/24/2020); Total knee arthroplasty (Right, 05/24/2020); Total knee revision (Right, 09/06/2020); Excision partial phalanx (Left, 09/28/2021); Wound debridement (Left, 09/28/2021); LEFT HEART CATH AND CORONARY ANGIOGRAPHY (Left, 02/27/2011); and Intrathecal pump revision (Right, 12/14/2021). [name] has a current medication list which includes the following prescription(s): accu-chek guide, accu-chek softclix lancets, albuterol, vitamin c, aspirin, benzonatate, biotin, accu-chek guide control, bupropion, calcium-magnesium-zinc, cinnamon, coenzyme q10, ezetimibe, fludrocortisone, glucosamine-chondroitin, insulin pen needle, magnesium, midodrine, molnupiravir eua, nitroglycerin, fish oil, [START ON 02/23/2022] oxycodone-acetaminophen, [START ON 03/25/2022] oxycodone-acetaminophen, [START ON 04/24/2022] oxycodone-acetaminophen, ozempic (0.25 or 0.5 mg/dose), paroxetine, polyethylene glycol, pregabalin, rosuvastatin, tart cherry, tizanidine, and vitamin e. Her primarily concern today is the No chief complaint on file.  She has No Known Allergies.   Last encounter: My last encounter with her was on 01/29/2022. Pertinent problems: 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); Lumbar spondylosis; Lumbar spinal  stenosis w/ neurogenic claudication; Abnormal MRI, lumbar spine (11/18/2019); Lumbar facet hypertrophy (Multilevel); Lumbar foraminal stenosis; Lumbar lateral recess stenosis; Chronic right-sided low back pain w/o sciatica from IT catheter anchor.; Chronic low back pain (Bilateral) w/o sciatica; DDD (degenerative disc disease), lumbosacral; Thoracic facet hypertrophy/arthropathy (Multilevel) (Bilateral); Thoracic facet syndrome (Multilevel) (Bilateral); Malfunction of intrathecal infusion pump; Malposition of intrathecal infusion catheter; Pain and numbness of upper extremity (Bilateral); Cervicalgia; History of carpal tunnel surgery (Bilateral); Recurrent right knee instability; 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 Pain Assessment: Severity of   is reported as a  /10. Location:    / . Onset:  . Quality:  . Timing:  . Modifying factor(s):  Marland Kitchen Vitals:  vitals were not taken for this visit.   Reason for encounter: Interventional pain management therapy due pain of at least four (4) weeks in duration, with to failure to respond to and/or inability to tolerate more conservative care.   Site Confirmation: Ms. Kinser was asked to confirm the procedure and laterality before marking the site.  Consent: Before the procedure and under the influence of no sedative(s), amnesic(s), or anxiolytics, the patient was informed of the treatment options, risks and possible complications. To fulfill our ethical and legal obligations, as recommended by the American Medical Association's Code of Ethics, I have informed the patient of my clinical  impression; the nature and purpose of the treatment or procedure; the risks, benefits, and possible complications of the intervention; the alternatives, including doing nothing; the risk(s) and benefit(s) of the alternative treatment(s) or procedure(s); and the risk(s) and benefit(s) of doing nothing. The patient was provided information about the general risks and possible complications associated with the procedure. These may include, but are not limited to: failure to achieve desired goals, infection, bleeding, organ or nerve damage, allergic reactions, paralysis, and death. In addition, the patient was informed of those risks and complications associated to Spine-related procedures, such as failure to decrease pain; infection (i.e.: Meningitis, epidural or intraspinal abscess); bleeding (  i.e.: epidural hematoma, subarachnoid hemorrhage, or any other type of intraspinal or peri-dural bleeding); organ or nerve damage (i.e.: Any type of peripheral nerve, nerve root, or spinal cord injury) with subsequent damage to sensory, motor, and/or autonomic systems, resulting in permanent pain, numbness, and/or weakness of one or several areas of the body; allergic reactions; (i.e.: anaphylactic reaction); and/or death. Furthermore, the patient was informed of those risks and complications associated with the medications. These include, but are not limited to: allergic reactions (i.e.: anaphylactic or anaphylactoid reaction(s)); adrenal axis suppression; blood sugar elevation that in diabetics may result in ketoacidosis or comma; water retention that in patients with history of congestive heart failure may result in shortness of breath, pulmonary edema, and decompensation with resultant heart failure; weight gain; swelling or edema; medication-induced neural toxicity; particulate matter embolism and blood vessel occlusion with resultant organ, and/or nervous system infarction; and/or aseptic necrosis of one or more  joints. Finally, the patient was informed that Medicine is not an exact science; therefore, there is also the possibility of unforeseen or unpredictable risks and/or possible complications that may result in a catastrophic outcome. The patient indicated having understood very clearly. We have given the patient no guarantees and we have made no promises. Enough time was given to the patient to ask questions, all of which were answered to the patient's satisfaction. Ms. Fusilier has indicated that she wanted to continue with the procedure. Attestation: I, the ordering provider, attest that I have discussed with the patient the benefits, risks, side-effects, alternatives, likelihood of achieving goals, and potential problems during recovery for the procedure that I have provided informed consent.  Date  Time: {[facility]-PAIN TIME CHOICES:21018001}   Prophylactic antibiotics  Anti-infectives (From admission, onward)    None      Indication(s): None identified   Description of procedure   Start Time:   hrs  Local Anesthesia: Once the patient was positioned, prepped, and time-out was completed. The target area was identified located. The skin was marked with an approved surgical skin marker. Once marked, the skin (epidermis, dermis, and hypodermis), and deeper tissues (fat, connective tissue and muscle) were infiltrated with a small amount of a short-acting local anesthetic, loaded on a 10cc syringe with a 25G, 1.5-in  Needle. An appropriate amount of time was allowed for local anesthetics to take effect before proceeding to the next step. Local Anesthetic: Lidocaine 1-2% The unused portion of the local anesthetic was discarded in the proper designated containers. Safety Precautions: Aspiration looking for blood return was conducted prior to all injections. At no point did I inject any substances, as a needle was being advanced. Before injecting, the patient was told to immediately notify me if she was  experiencing any new onset of "ringing in the ears, or metallic taste in the mouth". No attempts were made at seeking any paresthesias. Safe injection practices and needle disposal techniques used. Medications properly checked for expiration dates. SDV (single dose vial) medications used. After the completion of the procedure, all disposable equipment used was discarded in the proper designated medical waste containers.  Technical description: Protocol guidelines were followed. Using fluoroscopic guidance, the epidural needle was introduced through the skin, ipsilateral to the reported pain, and advanced to the target area. Posterior laminar os was contacted and the needle walked caudad, until the lamina was cleared. The ligamentum flavum was engaged and the epidural space identified using "loss-of-resistance technique" with 2-3 ml of PF-NaCl (0.9% NSS), in a 5cc dedicated LOR syringe. See "Imaging  guidance" below for use of contrast details.  Injection: Once satisfactory needle placement was confirmed, I proceeded to inject the desired solution in slow, incremental fashion, intermittently assessing for discomfort or any signs of abnormal or undesired spread of substance. Once completed, the needle was removed and disposed of, as per hospital protocols.   There were no vitals filed for this visit.  End Time:   hrs  Once the entire procedure was completed, the treated area was cleaned, making sure to leave some of the prepping solution back to take advantage of its long term bactericidal properties.   Imaging guidance  Type of Imaging Technique: Fluoroscopy Guidance (Spinal) Indication(s): Assistance in needle guidance and placement for procedures requiring needle placement in or near specific anatomical locations not easily accessible without such assistance. Exposure Time: Please see nurses notes for exact fluoroscopy time. Contrast: Before injecting any contrast, we confirmed that the patient did  not have an allergy to iodine, shellfish, or radiological contrast. Once satisfactory needle placement was completed, radiological contrast was injected under continuous fluoroscopic guidance. Injection of contrast accomplished without complications. See chart for type and volume of contrast used. Fluoroscopic Guidance: I was personally present in the fluoroscopy suite, where the patient was placed in position for the procedure, over the fluoroscopy-compatible table. Fluoroscopy was manipulated, using "Tunnel Vision Technique", to obtain the best possible view of the target area, on the affected side. Parallax error was corrected before commencing the procedure. A "direction-depth-direction" technique was used to introduce the needle under continuous pulsed fluoroscopic guidance. Once the target was reached, antero-posterior, oblique, and lateral fluoroscopic projection views were taken to confirm needle placement in all planes. Electronic images uploaded into EMR.  Interpretation: Successful epidural injection. Intraoperative imaging interpretation by performing Physician.    Post-op assessment  Post-procedure Vital Signs:  Pulse/HCG Rate:    Temp:   Resp:   BP:   SpO2:    EBL: None  Complications: No immediate post-treatment complications observed by team, or reported by patient.  Note: The patient tolerated the entire procedure well. A repeat set of vitals were taken after the procedure and the patient was kept under observation following institutional policy, for this type of procedure. Post-procedural neurological assessment was performed, showing return to baseline, prior to discharge. The patient was provided with post-procedure discharge instructions, including a section on how to identify potential problems. Should any problems arise concerning this procedure, the patient was given instructions to immediately contact us, at any time, without hesitation. In any case, we plan to contact the  patient by telephone for a follow-up status report regarding this interventional procedure.  Comments:  No additional relevant information.   Plan of care  Chronic Opioid Analgesic:  Oxycodone 5 mg, 1 tab PO q 8 hrs (15 mg/day of oxycodone) + Intrathecal PF-Fentanyl MME/day: 22.5 mg/day (oral).   Medications administered: Lubertha South had no medications administered during this visit.  Follow-up plan:   No follow-ups on file.      Interventional Therapies  Risk  Complexity Considerations:   Estimated body mass index is 30.72 kg/m as calculated from the following:   Height as of this encounter: '5\' 9"'$  (1.753 m).   Weight as of this encounter: 208 lb (94.3 kg). WNL   Planned  Pending:   Diagnostic/therapeutic left cervical ESI #1    Under consideration:   Possible left lumbar facet RFA  Possible right lumbar facet RFA  Diagnostic caudal ESI + diagnostic epidurogram  Possible Racz procedure  Diagnostic  bilateral IA knee injection (Steroid) Diagnostic bilateral genicular NB  Possible bilateral genicular nerve RFA  Possible bilateral Hyalgan knee injection  Diagnostic cervical ESI  Diagnostic bilateral cervical facet block  Possible bilateral cervical facet RFA    Completed:   Diagnostic/therapeutic left L2-3 LESI x1  Diagnostic/therapeutic right lumbar facet block x3 (06/23/2019) (100/100/75/>75)  Diagnostic/therapeutic left lumbar facet block x1 (06/23/2019) (100/100/75/>75)    Therapeutic  Palliative (PRN) options:   Palliative/therapeutic intrathecal pump management (refills/programming adjustments)  Palliative left L2-3 LESI #2  Palliative right lumbar facet block #4  Diagnostic left lumbar facet block #2      Recent Visits Date Type Provider Dept  01/29/22 Office Visit [name], MD [facility]  12/26/21 Office Visit [name], MD [facility]  12/07/21 Procedure visit [name], MD [facility]  Showing  recent visits within past 90 days and meeting all other requirements Future Appointments Date Type Provider Dept  02/15/22 Appointment [name], MD [facility]  Showing future appointments within next 90 days and meeting all other requirements   Disposition: Discharge home  Discharge (Date  Time): 02/15/2022;   hrs.   Primary Care Physician: Jearld Fenton, NP Location: [facility] Note by: Gaspar Cola, MD Date: 02/15/2022; Time: 6:24 PM  DISCLAIMER: Medicine is not an Chief Strategy Officer. It has no guarantees or warranties. The decision to proceed with this intervention was based on the information collected from the patient. Conclusions were drawn from the patient's questionnaire, interview, and examination. Because information was provided in large part by the patient, it cannot be guaranteed that it has not been purposely or unconsciously manipulated or altered. Every effort has been made to obtain as much accurate, relevant, available data as possible. Always take into account that the treatment will also be dependent on availability of resources and existing treatment guidelines, considered by other Pain Management Specialists as being common knowledge and practice, at the time of the intervention. It is also important to point out that variation in procedural techniques and pharmacological choices are the acceptable norm. For Medico-Legal review purposes, the indications, contraindications, technique, and results of the these procedures should only be evaluated, judged and interpreted by a Board-Certified Interventional Pain Specialist with extensive familiarity and expertise in the same exact procedure and technique.

## 2022-02-11 NOTE — ED Triage Notes (Signed)
Patient reports that she started having symptoms last night.   Patient reports that she went to a conference out of state.   Patient took an '@home'$  COVID test yesterday and it was positive.

## 2022-02-11 NOTE — Discharge Instructions (Signed)
Your home test for COVID-19 was positive, meaning that you were infected with the novel coronavirus and could give the germ to others.  Please continue isolation at home for at least 5 days since the start of your symptoms. Once you complete your 5 day quarantine, you may return to normal activities as long as you've not had a fever for over 24 hours(without taking fever reducing medicine) and your symptoms are improving. Wear a mask until Day 11.   Please continue good preventive care measures, including:  frequent hand-washing, avoid touching your face, cover coughs/sneezes, stay out of crowds and keep a 6 foot distance from others.    Go to the nearest hospital emergency room if fever/cough/breathlessness are severe or illness seems like a threat to life.

## 2022-02-12 ENCOUNTER — Encounter: Payer: Self-pay | Admitting: Internal Medicine

## 2022-02-12 ENCOUNTER — Encounter: Payer: Medicare Other | Admitting: Pain Medicine

## 2022-02-15 ENCOUNTER — Ambulatory Visit: Payer: Medicare Other | Admitting: Pain Medicine

## 2022-02-15 ENCOUNTER — Telehealth: Payer: Self-pay | Admitting: Gastroenterology

## 2022-02-15 NOTE — Telephone Encounter (Signed)
Patient tested positive for covid on 02/11/2022 and has been on medication. Patient is wondering if she can keep her procedure date or if she will have to reschedule. Requesting call back.

## 2022-02-15 NOTE — Telephone Encounter (Signed)
Informed patient that it was up to her if she reschedule the colonoscopy. She states that she is feeling better. Informed since the procedure is the end of next week and is past the 5 days she will be okay to have procedure

## 2022-02-16 ENCOUNTER — Other Ambulatory Visit: Payer: Self-pay | Admitting: Internal Medicine

## 2022-02-16 NOTE — Telephone Encounter (Signed)
Requested Prescriptions  Pending Prescriptions Disp Refills  . OZEMPIC, 0.25 OR 0.5 MG/DOSE, 2 MG/3ML SOPN [Pharmacy Med Name: OZEMPIC 0.25-0.5 MG/DOSE PEN] 9 mL 0    Sig: INJECT 0.5 MG INTO THE SKIN ONCE A WEEK.     Endocrinology:  Diabetes - GLP-1 Receptor Agonists - semaglutide Failed - 02/16/2022  2:42 AM      Failed - HBA1C in normal range and within 180 days    Hgb A1c MFr Bld  Date Value Ref Range Status  01/23/2022 5.7 (H) <5.7 % of total Hgb Final    Comment:    For someone without known diabetes, a hemoglobin  A1c value between 5.7% and 6.4% is consistent with prediabetes and should be confirmed with a  follow-up test. . For someone with known diabetes, a value <7% indicates that their diabetes is well controlled. A1c targets should be individualized based on duration of diabetes, age, comorbid conditions, and other considerations. . This assay result is consistent with an increased risk of diabetes. . Currently, no consensus exists regarding use of hemoglobin A1c for diagnosis of diabetes for children. .          Failed - Cr in normal range and within 360 days    Creat  Date Value Ref Range Status  01/23/2022 1.13 (H) 0.50 - 1.05 mg/dL Final   Creatinine,U  Date Value Ref Range Status  03/12/2019 131.0 mg/dL Final   Creatinine, Urine  Date Value Ref Range Status  01/23/2022 69 20 - 275 mg/dL Final         Passed - Valid encounter within last 6 months    Recent Outpatient Visits          3 weeks ago Screening for colon cancer   [facility] [place], Coralie Keens, NP   1 month ago Opioid-induced constipation (OIC)   [facility], Coralie Keens, NP   4 months ago Preop general physical exam   [place], [place]   5 months ago [facility] [place], [place] W, NP   7 months ago Type 2 diabetes mellitus with diabetic mononeuropathy, without long-term current use of insulin  [facility])   [facility], Coralie Keens, NP      Future Appointments            In 2 months Marlou Porch, Thana Farr, MD [place], [facility]

## 2022-02-22 ENCOUNTER — Ambulatory Visit
Admission: RE | Admit: 2022-02-22 | Discharge: 2022-02-22 | Disposition: A | Discharge status: Home / Self Care | Payer: Medicare Other | Source: Ambulatory Visit | Attending: Gastroenterology | Admitting: Gastroenterology

## 2022-02-22 ENCOUNTER — Ambulatory Visit: Payer: Medicare Other | Admitting: Anesthesiology

## 2022-02-22 ENCOUNTER — Encounter: Payer: Self-pay | Admitting: Gastroenterology

## 2022-02-22 ENCOUNTER — Encounter
Admission: RE | Disposition: A | Discharge status: Home / Self Care | Payer: Self-pay | Source: Ambulatory Visit | Attending: Gastroenterology

## 2022-02-22 DIAGNOSIS — Z79891 Long term (current) use of opiate analgesic: Secondary | ICD-10-CM | POA: Insufficient documentation

## 2022-02-22 DIAGNOSIS — Z955 Presence of coronary angioplasty implant and graft: Secondary | ICD-10-CM | POA: Insufficient documentation

## 2022-02-22 DIAGNOSIS — Z1211 Encounter for screening for malignant neoplasm of colon: Secondary | ICD-10-CM | POA: Diagnosis not present

## 2022-02-22 DIAGNOSIS — E1151 Type 2 diabetes mellitus with diabetic peripheral angiopathy without gangrene: Secondary | ICD-10-CM | POA: Diagnosis not present

## 2022-02-22 DIAGNOSIS — F418 Other specified anxiety disorders: Secondary | ICD-10-CM | POA: Insufficient documentation

## 2022-02-22 DIAGNOSIS — N183 Chronic kidney disease, stage 3 unspecified: Secondary | ICD-10-CM | POA: Insufficient documentation

## 2022-02-22 DIAGNOSIS — E785 Hyperlipidemia, unspecified: Secondary | ICD-10-CM | POA: Diagnosis not present

## 2022-02-22 DIAGNOSIS — K635 Polyp of colon: Secondary | ICD-10-CM | POA: Diagnosis not present

## 2022-02-22 DIAGNOSIS — I2510 Atherosclerotic heart disease of native coronary artery without angina pectoris: Secondary | ICD-10-CM | POA: Diagnosis not present

## 2022-02-22 DIAGNOSIS — M797 Fibromyalgia: Secondary | ICD-10-CM | POA: Diagnosis not present

## 2022-02-22 DIAGNOSIS — D126 Benign neoplasm of colon, unspecified: Secondary | ICD-10-CM | POA: Diagnosis not present

## 2022-02-22 DIAGNOSIS — U071 COVID-19: Secondary | ICD-10-CM

## 2022-02-22 DIAGNOSIS — D122 Benign neoplasm of ascending colon: Secondary | ICD-10-CM | POA: Insufficient documentation

## 2022-02-22 DIAGNOSIS — E1122 Type 2 diabetes mellitus with diabetic chronic kidney disease: Secondary | ICD-10-CM | POA: Diagnosis not present

## 2022-02-22 DIAGNOSIS — G4733 Obstructive sleep apnea (adult) (pediatric): Secondary | ICD-10-CM | POA: Diagnosis not present

## 2022-02-22 DIAGNOSIS — I700 Atherosclerosis of aorta: Secondary | ICD-10-CM | POA: Diagnosis not present

## 2022-02-22 HISTORY — DX: COVID-19: U07.1

## 2022-02-22 HISTORY — PX: COLONOSCOPY WITH PROPOFOL: SHX5780

## 2022-02-22 LAB — GLUCOSE, CAPILLARY: Glucose-Capillary: 116 mg/dL — ABNORMAL HIGH (ref 70–99)

## 2022-02-22 SURGERY — COLONOSCOPY WITH PROPOFOL
Anesthesia: General

## 2022-02-22 MED ORDER — PROPOFOL 10 MG/ML IV BOLUS
INTRAVENOUS | Status: DC | PRN
  Administered 2022-02-22: 70 mg via INTRAVENOUS

## 2022-02-22 MED ORDER — PROPOFOL 500 MG/50ML IV EMUL
INTRAVENOUS | Status: DC | PRN
  Administered 2022-02-22: 140 ug/kg/min via INTRAVENOUS

## 2022-02-22 MED ORDER — LIDOCAINE HCL (CARDIAC) PF 100 MG/5ML IV SOSY
PREFILLED_SYRINGE | INTRAVENOUS | Status: DC | PRN
  Administered 2022-02-22: 80 mg via INTRAVENOUS

## 2022-02-22 MED ORDER — ONDANSETRON HCL 4 MG/2ML IJ SOLN
INTRAMUSCULAR | Status: DC | PRN
  Administered 2022-02-22: 4 mg via INTRAVENOUS

## 2022-02-22 MED ORDER — SODIUM CHLORIDE 0.9 % IV SOLN: INTRAVENOUS | Status: DC

## 2022-02-22 NOTE — Anesthesia Preprocedure Evaluation (Signed)
Anesthesia Evaluation  Patient identified by MRN, date of birth, ID band Patient awake    Reviewed: Allergy & Precautions, H&P , NPO status , Patient's Chart, lab work & pertinent test results, reviewed documented beta blocker date and time   History of Anesthesia Complications (+) POST - OP SPINAL HEADACHE and history of anesthetic complications  Airway Mallampati: II   Neck ROM: full    Dental  (+) Poor Dentition   Pulmonary sleep apnea and Continuous Positive Airway Pressure Ventilation , former smoker,    Pulmonary exam normal        Cardiovascular Exercise Tolerance: Good + angina with exertion + CAD  Normal cardiovascular exam Rhythm:regular Rate:Normal     Neuro/Psych  Headaches, PSYCHIATRIC DISORDERS Anxiety Depression  Neuromuscular disease    GI/Hepatic Neg liver ROS, hiatal hernia,   Endo/Other  negative endocrine ROSdiabetes  Renal/GU Renal disease  negative genitourinary   Musculoskeletal   Abdominal   Peds  Hematology  (+) Blood dyscrasia, anemia ,   Anesthesia Other Findings Past Medical History: No date: Abdominal wall seroma 05/30/2010: Amputation of great toe, right, traumatic ([place]) 09/2017: Amputation of second toe, right, traumatic ([facility]) No date: Anginal pain ([place]) No date: Anxiety No date: Aortic atherosclerosis ([place]) 12/24/2014: Bilateral carotid artery stenosis     Comment:  a.) Doppler 12/24/2014: 60-79% BILATERAL ICA. b.)               Doppler 06/28/2015: 40-59% BILATERAL ICA. c.) Dopplers               04/23/2019, 04/22/2020, 10/20/2021: 40-59% RICA and 3-32%              LICA. No date: Blue toes     Comment:  2nd toe on right foot, will get appt. No date: Bulging disc 2009: CAD (coronary artery disease)     Comment:  a.) PCI with placement of a 15 x 30 mm Promus DES to               mLAD. b.) LHC 02/27/2011: EF 60%; no sig CAD; mild lum               irreg at ostium of diagnoal  (jailed by stent). No date: Chicken pox No date: Chronic fatigue No date: Chronic, continuous use of opioids No date: CKD (chronic kidney disease), stage III ([place]) 02/22/2022: COVID No date: Degenerative disc disease No date: Depression No date: Diverticulosis No date: Facet joint disease No date: Failed cervical fusion No date: Fibromyalgia No date: Hiatal hernia No date: Hyperlipidemia No date: IBS (irritable bowel syndrome) No date: MCL deficiency, knee 2011: MRSA (methicillin resistant staph aureus) culture positive     Comment:  GREAT TOE RIGHT FOOT 04/18/2010: Neuropathy 01/2020: Orthostatic hypotension     Comment:  a.) multiple pre-syncopal episodes; takes proamatine 08/17/2003: OSA on CPAP No date: Osteoarthritis     Comment:  a.) back, neck, hands, knees No date: Restless leg syndrome 09/2013: Sepsis ([place]) 11/07/2021: Spinal headache No date: Spinal stenosis No date: T2DM (type 2 diabetes mellitus) ([place]) 1992: Wolff-Parkinson-White (WPW) syndrome     Comment:  a.) s/p ablations; unsuccessful. Now has rare episodes Past Surgical History: 02/01/2017: AMPUTATION TOE; Right     Comment:  Procedure: AMPUTATION TOE-RIGHT 2ND MPJ;  Surgeon:               Albertine Patricia, DPM;  Location: [facility];  Service:  Podiatry;  Laterality: Right; 07/28/2014: ANTERIOR CERVICAL DECOMP/DISCECTOMY FUSION; N/A     Comment:  Procedure: ANTERIOR CERVICAL DECOMPRESSION/DISCECTOMY               FUSION CERVICAL 3-4,4-5,5-6 LEVELS WITH INSTRUMENTATION               AND ALLOGRAFT;  Surgeon: Sinclair Ship, MD;                Location: [place];  Service: Orthopedics;  Laterality: N/A;              Anterior cervical decompression fusion, cervical 3-4,               cervical 4-5, cervical 5-6 with instrumentation and               allograft No date: BACK SURGERY     Comment:  X 3 1979, 1994, 1995 2013: BLEPHAROPLASTY; Bilateral 1992: CARDIAC ELECTROPHYSIOLOGY STUDY AND  ABLATION; N/A No date: CARPAL TUNNEL RELEASE; Right 2003: CHOLECYSTECTOMY 2009: CORONARY ANGIOPLASTY WITH STENT PLACEMENT; N/A     Comment:  Procedure: CORONARY ANGIOPLASTY WITH STENT PLACEMENT 2007: ENDOMETRIAL ABLATION 09/28/2021: EXCISION PARTIAL PHALANX; Left     Comment:  Procedure: EXCISION PARTIAL PHALANX - FIRST;  Surgeon:               Caroline More, DPM;  Location: [place];                Service: Podiatry;  Laterality: Left; No date: GALLBLADDER SURGERY 10/17/2017: HAMMER TOE SURGERY; Right     Comment:  Procedure: HAMMER TOE CORRECTION-4TH TOE;  Surgeon:               Albertine Patricia, DPM;  Location: [place];                Service: Podiatry;  Laterality: Right;  LMA- WITH               LOCAL Diabetic - diet controlled No date: HAND SURGERY; Left No date: INFUSION PUMP IMPLANTATION     Comment:  X2 with morphine and baclofen 04/25/2018: INTRATHECAL PUMP REVISION; N/A     Comment:  Procedure: Intrathecal pump replacement;  Surgeon:               Clydell Hakim, MD;  Location: [place];  Service:               Neurosurgery;  Laterality: N/A;  right 04/25/2018: INTRATHECAL PUMP REVISION; Right 12/12/2018: INTRATHECAL PUMP REVISION; N/A     Comment:  Procedure: Intrathecal pump revision with exploration of              pocket;  Surgeon: Clydell Hakim, MD;  Location: [place];                Service: Neurosurgery;  Laterality: N/A;  Intrathecal               pump revision with exploration of pocket 03/11/2020: INTRATHECAL PUMP REVISION; N/A     Comment:  Procedure: INTRATHECAL PUMP REVISION;  Surgeon: [name], MD;  Location: [facility];  Service:               Neurosurgery;  Laterality: N/A; 12/14/2021: INTRATHECAL PUMP REVISION; Right     Comment:  Procedure: INTRATHECAL PUMP REMOVAL;  Surgeon: Dossie Arbour,  Beatriz Chancellor, MD;  Location: [facility];  Service:               Neurosurgery;  Laterality: Right; 02/23/2017: IRRIGATION  AND DEBRIDEMENT FOOT; Right     Comment:  Procedure: EZMOQHUTML AND DEBRIDEMENT FOOT-right foot;                Surgeon: Samara Deist, DPM;  Location: [facility];                Service: Podiatry;  Laterality: Right; 05/24/2020: KNEE SURGERY; Right 02/27/2011: LEFT HEART CATH AND CORONARY ANGIOGRAPHY; Left     Comment:  Procedure: LEFT HEART CATH AND CORONARY ANGIOGRAPHY 08/15/2018: PAIN PUMP REVISION; N/A     Comment:  Procedure: Intrathecal PUMP REVISION;  Surgeon: Clydell Hakim, MD;  Location: [place];  Service: Neurosurgery;                Laterality: N/A;  INTRATHECAL PUMP REVISION No date: PLANTAR FASCIA SURGERY; Bilateral No date: TOE AMPUTATION; Right     Comment:  RIGHT great toe (1st digit) No date: TOE AMPUTATION; Right     Comment:  a.) RIGHT second toe (2nd digit) No date: TOE SURGERY     Comment:  then revision 8/18 05/24/2020: TOTAL KNEE ARTHROPLASTY; Right     Comment:  Procedure: RIGHT TOTAL KNEE ARTHROPLASTY;  Surgeon:               Melrose Nakayama, MD;  Location: [facility];  Service:               Orthopedics;  Laterality: Right; 09/06/2020: TOTAL KNEE REVISION; Right     Comment:  : RIGHT KNEE POLY REVISION;  Melrose Nakayama, MD) PLAN               TO DISCHARGE FROM PACU 09/28/2021: WOUND DEBRIDEMENT; Left     Comment:  Procedure: DEBRIDEMENT SKIN, SUBCUTANEOUS TISSUE -               [id number];  Surgeon: Caroline More, DPM;  Location: [place];  Service: Podiatry;  Laterality: Left;                Diabetic BMI    Body Mass Index: 29.39 kg/m     Reproductive/Obstetrics negative OB ROS                             Anesthesia Physical Anesthesia Plan  ASA: 3  Anesthesia Plan: General   Post-op Pain Management:    Induction:   PONV Risk Score and Plan:   Airway Management Planned:   Additional Equipment:   Intra-op Plan:   Post-operative Plan:   Informed Consent: I have reviewed the patients  History and Physical, chart, labs and discussed the procedure including the risks, benefits and alternatives for the proposed anesthesia with the patient or authorized representative who has indicated his/her understanding and acceptance.     Dental Advisory Given  Plan Discussed with: CRNA  Anesthesia Plan Comments:         Anesthesia Quick Evaluation

## 2022-02-22 NOTE — Transfer of Care (Signed)
Immediate Anesthesia Transfer of Care Note  Patient: Melinda [name]  Procedure(s) Performed: COLONOSCOPY WITH PROPOFOL  Patient Location: PACU  Anesthesia Type:General  Level of Consciousness: awake, alert  and oriented  Airway & Oxygen Therapy: Patient Spontanous Breathing  Post-op Assessment: Report given to RN and Post -op Vital signs reviewed and stable  Post vital signs: Reviewed and stable  Last Vitals:  Vitals Value Taken Time  BP 139/76 02/22/22 1038  Temp    Pulse 80 02/22/22 1038  Resp 30 02/22/22 1038  SpO2 100 % 02/22/22 1038  Vitals shown include unvalidated device data.  Last Pain:  Vitals:   02/22/22 0937  TempSrc: Temporal  PainSc: 0-No pain         Complications: No notable events documented.

## 2022-02-22 NOTE — Op Note (Signed)
[facility] Gastroenterology Patient Name: Melinda [name] Procedure Date: 02/22/2022 9:54 AM MRN: [id number] Account #: [id number] Date of Birth: 06-May-1956 Admit Type: Outpatient Age: 66 Room: [facility] ENDO ROOM 3 Gender: Female Note Status: Finalized Instrument Name: Jasper Riling [id number] Procedure:             Colonoscopy Indications:           Screening for colorectal malignant neoplasm Providers:             Jonathon Bellows MD, MD Referring MD:          Jearld Fenton (Referring MD) Medicines:             Monitored Anesthesia Care Complications:         No immediate complications. Procedure:             Pre-Anesthesia Assessment:                        - Prior to the procedure, a History and Physical was                         performed, and patient medications, allergies and                         sensitivities were reviewed. The patient's tolerance                         of previous anesthesia was reviewed.                        - The risks and benefits of the procedure and the                         sedation options and risks were discussed with the                         patient. All questions were answered and informed                         consent was obtained.                        - ASA Grade Assessment: II - A patient with mild                         systemic disease.                        After obtaining informed consent, the colonoscope was                         passed under direct vision. Throughout the procedure,                         the patient's blood pressure, pulse, and oxygen                         saturations were monitored continuously. The                         Colonoscope was introduced  through the anus and                         advanced to the the cecum, identified by the                         appendiceal orifice. The colonoscopy was performed                         with ease. The patient tolerated the procedure well.                          The quality of the bowel preparation was excellent. Findings:      The perianal and digital rectal examinations were normal.      A 6 mm polyp was found in the ascending colon. The polyp was sessile.       The polyp was removed with a cold snare. Resection and retrieval were       complete.      The exam was otherwise without abnormality on direct and retroflexion       views. Impression:            - One 6 mm polyp in the ascending colon, removed with                         a cold snare. Resected and retrieved.                        - The examination was otherwise normal on direct and                         retroflexion views. Recommendation:        - Discharge patient to home (with escort).                        - Resume previous diet.                        - Continue present medications.                        - Await pathology results.                        - Repeat colonoscopy for surveillance based on                         pathology results. Procedure Code(s):     --- Professional ---                        [phone], Colonoscopy, flexible; with removal of                         tumor(s), polyp(s), or other lesion(s) by snare                         technique Diagnosis Code(s):     --- Professional ---  Z12.11, Encounter for screening for malignant neoplasm                         of colon                        K63.5, Polyp of colon CPT copyright 2019 [employer]. All rights reserved. The codes documented in this report are preliminary and upon coder review may  be revised to meet current compliance requirements. Jonathon Bellows, MD Jonathon Bellows MD, MD 02/22/2022 10:36:20 AM This report has been signed electronically. Number of Addenda: 0 Note Initiated On: 02/22/2022 9:54 AM Scope Withdrawal Time: 0 hours 12 minutes 59 seconds  Total Procedure Duration: 0 hours 19 minutes 14 seconds  Estimated Blood Loss:  Estimated  blood loss: none.      [facility]

## 2022-02-22 NOTE — H&P (Signed)
Melinda Bellows, MD [address], [place], [place], [place], [address] [id number], [place], [place], [place], [address] Phone: [phone]  Fax: [phone]  Primary Care Physician:  Jearld Fenton, NP   Pre-Procedure History & Physical: HPI:  Melinda [name] is a 66 y.o. female is here for an colonoscopy.   Past Medical History:  Diagnosis Date   Abdominal wall seroma    Amputation of great toe, right, traumatic ([place]) 05/30/2010   Amputation of second toe, right, traumatic ([place]) 09/2017   Anginal pain (HCC)    Anxiety    Aortic atherosclerosis ([place])    Bilateral carotid artery stenosis 12/24/2014   a.) Doppler 12/24/2014: 60-79% BILATERAL ICA. b.) Doppler 06/28/2015: 40-59% BILATERAL ICA. c.) Dopplers 04/23/2019, 04/22/2020, 10/20/2021: 50-35% RICA and 4-65% LICA.   Blue toes    2nd toe on right foot, will get appt.   Bulging disc    CAD (coronary artery disease) 2009   a.) PCI with placement of a 15 x 30 mm Promus DES to mLAD. b.) LHC 02/27/2011: EF 60%; no sig CAD; mild lum irreg at ostium of diagnoal (jailed by stent).   Chicken pox    Chronic fatigue    Chronic, continuous use of opioids    CKD (chronic kidney disease), stage III ([place])    COVID 02/22/2022   Degenerative disc disease    Depression    Diverticulosis    Facet joint disease    Failed cervical fusion    Fibromyalgia    Hiatal hernia    Hyperlipidemia    IBS (irritable bowel syndrome)    MCL deficiency, knee    MRSA (methicillin resistant staph aureus) culture positive 2011   GREAT TOE RIGHT FOOT   Neuropathy 04/18/2010   Orthostatic hypotension 01/2020   a.) multiple pre-syncopal episodes; takes proamatine   OSA on CPAP 08/17/2003   Osteoarthritis    a.) back, neck, hands, knees   Restless leg syndrome    Sepsis ([place]) 09/2013   Spinal headache 11/07/2021   Spinal stenosis    T2DM (type 2 diabetes mellitus) ([place])    Wolff-Parkinson-White (WPW) syndrome 1992   a.) s/p ablations;  unsuccessful. Now has rare episodes    Past Surgical History:  Procedure Laterality Date   AMPUTATION TOE Right 02/01/2017   Procedure: AMPUTATION TOE-RIGHT 2ND MPJ;  Surgeon: Albertine Patricia, DPM;  Location: [facility];  Service: Podiatry;  Laterality: Right;   ANTERIOR CERVICAL DECOMP/DISCECTOMY FUSION N/A 07/28/2014   Procedure: ANTERIOR CERVICAL DECOMPRESSION/DISCECTOMY FUSION CERVICAL 3-4,4-5,5-6 LEVELS WITH INSTRUMENTATION AND ALLOGRAFT;  Surgeon: Sinclair Ship, MD;  Location: [place] [facility];  Service: Orthopedics;  Laterality: N/A;  Anterior cervical decompression fusion, cervical 3-4, cervical 4-5, cervical 5-6 with instrumentation and allograft   BACK SURGERY     X 3 1979, 1994, 1995   BLEPHAROPLASTY Bilateral 2013   CARDIAC ELECTROPHYSIOLOGY STUDY AND ABLATION N/A 1992   CARPAL TUNNEL RELEASE Right    CHOLECYSTECTOMY  2003   CORONARY ANGIOPLASTY WITH STENT PLACEMENT N/A 2009   Procedure: CORONARY ANGIOPLASTY WITH STENT PLACEMENT   ENDOMETRIAL ABLATION  2007   EXCISION PARTIAL PHALANX Left 09/28/2021   Procedure: EXCISION PARTIAL PHALANX - FIRST;  Surgeon: Caroline More, DPM;  Location: [facility];  Service: Podiatry;  Laterality: Left;   GALLBLADDER SURGERY     HAMMER TOE SURGERY Right 10/17/2017   Procedure: HAMMER TOE CORRECTION-4TH TOE;  Surgeon: Albertine Patricia, DPM;  Location: [facility];  Service: Podiatry;  Laterality: Right;  LMA- WITH LOCAL Diabetic - diet controlled   HAND SURGERY Left    INFUSION PUMP IMPLANTATION     X2 with morphine and baclofen   INTRATHECAL PUMP REVISION N/A 04/25/2018   Procedure: Intrathecal pump replacement;  Surgeon: Clydell Hakim, MD;  Location: [facility];  Service: Neurosurgery;  Laterality: N/A;  right   INTRATHECAL PUMP REVISION Right 04/25/2018   INTRATHECAL PUMP REVISION N/A 12/12/2018   Procedure: Intrathecal pump revision with exploration of pocket;  Surgeon: Clydell Hakim, MD;  Location: [facility];  Service: Neurosurgery;   Laterality: N/A;  Intrathecal pump revision with exploration of pocket   INTRATHECAL PUMP REVISION N/A 03/11/2020   Procedure: INTRATHECAL PUMP REVISION;  Surgeon: [name], MD;  Location: [facility];  Service: Neurosurgery;  Laterality: N/A;   INTRATHECAL PUMP REVISION Right 12/14/2021   Procedure: INTRATHECAL PUMP REMOVAL;  Surgeon: [name], MD;  Location: [facility];  Service: Neurosurgery;  Laterality: Right;   IRRIGATION AND DEBRIDEMENT FOOT Right 02/23/2017   Procedure: IRRIGATION AND DEBRIDEMENT FOOT-right foot;  Surgeon: Samara Deist, DPM;  Location: [facility];  Service: Podiatry;  Laterality: Right;   KNEE SURGERY Right 05/24/2020   LEFT HEART CATH AND CORONARY ANGIOGRAPHY Left 02/27/2011   Procedure: LEFT HEART CATH AND CORONARY ANGIOGRAPHY   PAIN PUMP REVISION N/A 08/15/2018   Procedure: Intrathecal PUMP REVISION;  Surgeon: Clydell Hakim, MD;  Location: [facility];  Service: Neurosurgery;  Laterality: N/A;  INTRATHECAL PUMP REVISION   PLANTAR FASCIA SURGERY Bilateral    TOE AMPUTATION Right    RIGHT great toe (1st digit)   TOE AMPUTATION Right    a.) RIGHT second toe (2nd digit)   TOE SURGERY     then revision 8/18   TOTAL KNEE ARTHROPLASTY Right 05/24/2020   Procedure: RIGHT TOTAL KNEE ARTHROPLASTY;  Surgeon: Melrose Nakayama, MD;  Location[facility];  Service: Orthopedics;  Laterality: Right;   TOTAL KNEE REVISION Right 09/06/2020   : RIGHT KNEE POLY REVISION;  Melrose Nakayama, MD) PLAN TO DISCHARGE FROM PACU   WOUND DEBRIDEMENT Left 09/28/2021   Procedure: DEBRIDEMENT SKIN, SUBCUTANEOUS TISSUE - 18299;  Surgeon: Caroline More, DPM;  Location: [facility];  Service: Podiatry;  Laterality: Left;  Diabetic    Prior to Admission medications   Medication Sig Start Date End Date Taking? Authorizing Provider  ACCU-CHEK GUIDE test strip USE 1 EACH BY OTHER ROUTE 3 (THREE) TIMES DAILY. USE AS INSTRUCTE 02/10/21   Jearld Fenton, NP  Accu-Chek Softclix Lancets  lancets USE TO CHECK BLOOD SUGAR 1 TIME DAILY 08/04/19   Jearld Fenton, NP  albuterol (VENTOLIN HFA) 108 (90 Base) MCG/ACT inhaler Inhale 2 puffs into the lungs every 4 (four) hours as needed for wheezing or shortness of breath. 02/11/22   Lyndee Hensen, DO  Ascorbic Acid (VITAMIN C) 1000 MG tablet Take 1,000 mg by mouth daily.    [provider]  ASPIRIN 81 PO Take 81 mg by mouth daily.    [provider]  benzonatate (TESSALON) 100 MG capsule Take 1 capsule (100 mg total) by mouth every 8 (eight) hours. 02/11/22   Lyndee Hensen, DO  Biotin 10000 MCG TABS Take 10,000 mcg by mouth daily.    [provider]  Blood Glucose Calibration (ACCU-CHEK GUIDE CONTROL) LIQD 1 each by In Vitro route as needed. 09/19/20   Jearld Fenton, NP  buPROPion (WELLBUTRIN XL) 150 MG 24 hr tablet TAKE 1 TABLET BY MOUTH EVERY DAY 01/25/22   Jearld Fenton, NP  CALCIUM-MAGNESIUM-ZINC PO Take 3 capsules by mouth daily.    [provider]  CINNAMON PO Take 1,000 mg by mouth daily.    [provider]  Coenzyme Q10 (CO Q 10 PO) Take 1 tablet by mouth daily.    [provider]  ezetimibe (ZETIA) 10 MG tablet TAKE 1 TABLET BY MOUTH EVERY DAY 12/11/21   Jearld Fenton, NP  fludrocortisone (FLORINEF) 0.1 MG tablet Take 1 tablet (0.1 mg total) by mouth daily. Patient taking differently: Take 0.1 mg by mouth every morning. 08/28/21   Jerline Pain, MD  glucosamine-chondroitin 500-400 MG tablet Take 2 tablets by mouth daily.     [provider]  Insulin Pen Needle 31G X 5 MM MISC BD Pen Needles- brand specific Inject insulin via insulin pen 6 x daily 12/28/20   Jearld Fenton, NP  Magnesium 400 MG CAPS Take 400 mg by mouth at bedtime.    [provider]  midodrine (PROAMATINE) 5 MG tablet Take 1 tablet (5 mg total) by mouth 3 (three) times daily with meals. 01/22/22   Jerline Pain, MD  nitroGLYCERIN (NITROSTAT) 0.4 MG SL tablet PLACE 1 TABLET UNDER THE TONGUE  EVERY 5 (FIVE) MINUTES AS NEEDED FOR CHEST PAIN. 03/01/21   Jerline Pain, MD  Omega-3 Fatty Acids (FISH OIL) 1200 MG CAPS Take 1,200 mg by mouth in the morning.     [provider]  oxyCODONE-acetaminophen (PERCOCET) 5-325 MG tablet Take 1 tablet by mouth every 8 (eight) hours as needed for severe pain. Must last 30 days. 02/23/22 03/25/22  [name], MD  oxyCODONE-acetaminophen (PERCOCET) 5-325 MG tablet Take 1 tablet by mouth every 8 (eight) hours as needed for severe pain. Must last 30 days. 03/25/22 04/24/22  [name], MD  oxyCODONE-acetaminophen (PERCOCET) 5-325 MG tablet Take 1 tablet by mouth every 8 (eight) hours as needed for severe pain. Must last 30 days. 04/24/22 05/24/22  [name], MD  OZEMPIC, 0.25 OR 0.5 MG/DOSE, 2 MG/3ML SOPN INJECT 0.5 MG INTO THE SKIN ONCE A WEEK. 02/16/22   Jearld Fenton, NP  PARoxetine (PAXIL) 20 MG tablet TAKE 1/2 TABLET BY MOUTH DAILY IN THE MORNING 01/12/22   Jearld Fenton, NP  polyethylene glycol (MIRALAX / GLYCOLAX) packet Take 17 g by mouth daily.    [provider]  pregabalin (LYRICA) 150 MG capsule TAKE 1 CAPSULE BY MOUTH 3 TIMES DAILY. 12/22/21   Jearld Fenton, NP  rosuvastatin (CRESTOR) 20 MG tablet TAKE 1 TABLET BY MOUTH EVERY DAY 09/06/21   Jerline Pain, MD  TART CHERRY PO Take 1 tablet by mouth daily at 6 (six) AM.    [provider]  tiZANidine (ZANAFLEX) 4 MG tablet TAKE 1 TABLET (4 MG TOTAL) BY MOUTH EVERY 8 (EIGHT) HOURS AS NEEDED FOR MUSCLE SPASMS 01/25/22 04/25/22  Jearld Fenton, NP  vitamin E 180 MG (400 UNITS) capsule Take 400 Units by mouth daily.    [provider]    Allergies as of 01/25/2022   (No Known Allergies)    Family History  Problem Relation Age of Onset   Cancer Mother    Lung cancer Mother    Arthritis Father    Heart disease Father    Diabetes Father    Dementia Maternal Grandmother    Alcohol abuse Paternal Grandfather    Heart disease Paternal  Grandfather    Stroke Paternal Grandfather     Social History   Socioeconomic History  Marital status: Significant Other    Spouse name: Not on file   Number of children: Not on file   Years of education: Not on file   Highest education level: Not on file  Occupational History   Not on file  Tobacco Use   Smoking status: Former    Packs/day: 1.50    Years: 32.00    Total pack years: 48.00    Types: Cigarettes    Quit date: 07/22/2007    Years since quitting: 14.6   Smokeless tobacco: Never  Vaping Use   Vaping Use: Never used  Substance and Sexual Activity   Alcohol use: Yes    Comment: occ - Holidays   Drug use: Never    Comment: prescribed pain pump and oxy   Sexual activity: Yes  Other Topics Concern   Not on file  Social History Narrative   Lives at home with a partner. Independent at baseline   Social Determinants of Health   Financial Resource Strain: Low Risk  (10/05/2021)   Overall Financial Resource Strain (CARDIA)    Difficulty of Paying Living Expenses: Not hard at all  Food Insecurity: No Food Insecurity (10/05/2021)   Hunger Vital Sign    Worried About Running Out of Food in the Last Year: Never true    Ran Out of Food in the Last Year: Never true  Transportation Needs: No Transportation Needs (10/05/2021)   PRAPARE - Hydrologist (Medical): No    Lack of Transportation (Non-Medical): No  Physical Activity: Inactive (10/05/2021)   Exercise Vital Sign    Days of Exercise per Week: 0 days    Minutes of Exercise per Session: 0 min  Stress: Not on file  Social Connections: Moderately Isolated (10/05/2021)   Social Connection and Isolation Panel [NHANES]    Frequency of Communication with Friends and Family: More than three times a week    Frequency of Social Gatherings with Friends and Family: Never    Attends Religious Services: Never    Marine scientist or Organizations: No    Attends Music therapist: Never     Marital Status: Living with partner  Intimate Partner Violence: Not on file    Review of Systems: See HPI, otherwise negative ROS  Physical Exam: BP (!) 185/98   Pulse 88   Temp 97.9 F (36.6 C) (Temporal)   Resp 18   Ht '5\' 9"'$  (1.753 m)   Wt 90.3 kg   LMP  (LMP Unknown)   SpO2 96%   BMI 29.39 kg/m  General:   Alert,  pleasant and cooperative in NAD Head:  Normocephalic and atraumatic. Neck:  Supple; no masses or thyromegaly. Lungs:  Clear throughout to auscultation, normal respiratory effort.    Heart:  +S1, +S2, Regular rate and rhythm, No edema. Abdomen:  Soft, nontender and nondistended. Normal bowel sounds, without guarding, and without rebound.   Neurologic:  Alert and  oriented x4;  grossly normal neurologically.  Impression/Plan: Melinda [name] is here for an colonoscopy to be performed for Screening colonoscopy average risk   Risks, benefits, limitations, and alternatives regarding  colonoscopy have been reviewed with the patient.  Questions have been answered.  All parties agreeable.   Melinda Bellows, MD  02/22/2022, 10:05 AM

## 2022-02-23 ENCOUNTER — Encounter: Payer: Self-pay | Admitting: Gastroenterology

## 2022-02-23 LAB — SURGICAL PATHOLOGY

## 2022-02-24 NOTE — Anesthesia Postprocedure Evaluation (Signed)
Anesthesia Post Note  Patient: Melinda [name]  Procedure(s) Performed: COLONOSCOPY WITH PROPOFOL  Patient location during evaluation: PACU Anesthesia Type: General Level of consciousness: awake and alert Pain management: pain level controlled Vital Signs Assessment: post-procedure vital signs reviewed and stable Respiratory status: spontaneous breathing, nonlabored ventilation, respiratory function stable and patient connected to nasal cannula oxygen Cardiovascular status: blood pressure returned to baseline and stable Postop Assessment: no apparent nausea or vomiting Anesthetic complications: no   No notable events documented.   Last Vitals:  Vitals:   02/22/22 1048 02/22/22 1058  BP: (!) 154/88 (!) 157/91  Pulse: 80 79  Resp: (!) 26 (!) 34  Temp:    SpO2: 99% 99%    Last Pain:  Vitals:   02/22/22 1038  TempSrc: Temporal  PainSc: [place]

## 2022-02-26 ENCOUNTER — Encounter: Payer: Self-pay | Admitting: Gastroenterology

## 2022-02-28 DIAGNOSIS — L84 Corns and callosities: Secondary | ICD-10-CM | POA: Diagnosis not present

## 2022-02-28 DIAGNOSIS — E1142 Type 2 diabetes mellitus with diabetic polyneuropathy: Secondary | ICD-10-CM | POA: Diagnosis not present

## 2022-02-28 DIAGNOSIS — Z89421 Acquired absence of other right toe(s): Secondary | ICD-10-CM | POA: Diagnosis not present

## 2022-03-07 ENCOUNTER — Other Ambulatory Visit: Payer: Self-pay | Admitting: Internal Medicine

## 2022-03-08 NOTE — Telephone Encounter (Signed)
Requested Prescriptions  Pending Prescriptions Disp Refills  . ezetimibe (ZETIA) 10 MG tablet [Pharmacy Med Name: EZETIMIBE 10 MG TABLET] 90 tablet 0    Sig: TAKE 1 TABLET BY MOUTH EVERY DAY     Cardiovascular:  Antilipid - Sterol Transport Inhibitors Failed - 03/07/2022  2:13 AM      Failed - Lipid Panel in normal range within the last 12 months    Cholesterol  Date Value Ref Range Status  01/23/2022 98 <200 mg/dL Final   LDL Cholesterol (Calc)  Date Value Ref Range Status  01/23/2022 36 mg/dL (calc) Final    Comment:    Reference range: <100 . Desirable range <100 mg/dL for primary prevention;   <70 mg/dL for patients with CHD or diabetic patients  with > or = 2 CHD risk factors. Marland Kitchen LDL-C is now calculated using the Martin-Hopkins  calculation, which is a validated novel method providing  better accuracy than the Friedewald equation in the  estimation of LDL-C.  Cresenciano Genre et al. Annamaria Helling. 3557;322(02): 2061-2068  (http://education.[employer].com/faq/FAQ164)    Direct LDL  Date Value Ref Range Status  11/04/2017 63.0 mg/dL Final    Comment:    Optimal:  <100 mg/dLNear or Above Optimal:  100-129 mg/dLBorderline High:  130-159 mg/dLHigh:  160-189 mg/dLVery High:  >190 mg/dL   HDL  Date Value Ref Range Status  01/23/2022 39 (L) > OR = 50 mg/dL Final   Triglycerides  Date Value Ref Range Status  01/23/2022 155 (H) <150 mg/dL Final         Passed - AST in normal range and within 360 days    AST  Date Value Ref Range Status  01/23/2022 27 10 - 35 U/L Final         Passed - ALT in normal range and within 360 days    ALT  Date Value Ref Range Status  01/23/2022 29 6 - 29 U/L Final         Passed - Patient is not pregnant      Passed - Valid encounter within last 12 months    Recent Outpatient Visits          1 month ago Screening for colon cancer   [facility] Jearld Fenton, NP   2 months ago Opioid-induced constipation (OIC)   [facility], Coralie Keens, NP   5 months ago Preop general physical exam   [place], [place]   6 months ago [facility] [place], [place] W, NP   8 months ago Type 2 diabetes mellitus with diabetic mononeuropathy, without long-term current use of insulin [facility])   [facility], Coralie Keens, NP      Future Appointments            In 1 month Skains, Thana Farr, MD [facility] [place]. [facility], [facility]

## 2022-03-21 ENCOUNTER — Other Ambulatory Visit: Payer: Self-pay | Admitting: Internal Medicine

## 2022-03-21 DIAGNOSIS — M797 Fibromyalgia: Secondary | ICD-10-CM

## 2022-03-22 NOTE — Telephone Encounter (Signed)
Requested medication (s) are due for refill today: yes  Requested medication (s) are on the active medication list: yes  Last refill:  12/22/21  Future visit scheduled: yes  Notes to clinic:  Unable to refill per protocol, cannot delegate.      Requested Prescriptions  Pending Prescriptions Disp Refills   pregabalin (LYRICA) 150 MG capsule [Pharmacy Med Name: PREGABALIN 150 MG CAPSULE] 270 capsule 0    Sig: TAKE 1 CAPSULE BY MOUTH THREE TIMES A DAY     Not Delegated - Neurology:  Anticonvulsants - Controlled - pregabalin Failed - 03/21/2022  1:45 PM      Failed - This refill cannot be delegated      Failed - Cr in normal range and within 360 days    Creat  Date Value Ref Range Status  01/23/2022 1.13 (H) 0.50 - 1.05 mg/dL Final   Creatinine,U  Date Value Ref Range Status  03/12/2019 131.0 mg/dL Final   Creatinine, Urine  Date Value Ref Range Status  01/23/2022 69 20 - 275 mg/dL Final         Passed - Completed PHQ-2 or PHQ-9 in the last 360 days      Passed - Valid encounter within last 12 months    Recent Outpatient Visits           1 month ago Screening for colon cancer   [facility] Jearld Fenton, NP   2 months ago Opioid-induced constipation (OIC)   [facility], Coralie Keens, NP   6 months ago Preop general physical exam   [place], [place]   6 months ago [facility] [place], [place] W, NP   9 months ago Type 2 diabetes mellitus with diabetic mononeuropathy, without long-term current use of insulin [facility])   [facility], Coralie Keens, NP       Future Appointments             In 1 month Skains, Thana Farr, MD [place]. [facility], [facility]

## 2022-04-14 ENCOUNTER — Other Ambulatory Visit: Payer: Self-pay | Admitting: Internal Medicine

## 2022-04-16 NOTE — Telephone Encounter (Signed)
Requested Prescriptions  Pending Prescriptions Disp Refills  . PARoxetine (PAXIL) 20 MG tablet [Pharmacy Med Name: PAROXETINE HCL 20 MG TABLET] 45 tablet 0    Sig: TAKE 1/2 TABLET BY MOUTH DAILY IN THE MORNING     Psychiatry:  Antidepressants - SSRI Passed - 04/14/2022  2:30 PM      Passed - Completed PHQ-2 or PHQ-9 in the last 360 days      Passed - Valid encounter within last 6 months    Recent Outpatient Visits          2 months ago Screening for colon cancer   [facility] [place], Coralie Keens, NP   3 months ago Opioid-induced constipation (OIC)   [facility], Coralie Keens, NP   6 months ago Preop general physical exam   [place], [place]   7 months ago [facility] [place], [place] W, NP   9 months ago Type 2 diabetes mellitus with diabetic mononeuropathy, without long-term current use of insulin ([place])   [facility], Coralie Keens, NP      Future Appointments            In 2 weeks Marlou Porch, Thana Farr, MD [place]. [facility], [facility]

## 2022-04-17 ENCOUNTER — Ambulatory Visit (INDEPENDENT_AMBULATORY_CARE_PROVIDER_SITE_OTHER): Payer: Medicare Other | Admitting: Sports Medicine

## 2022-04-17 VITALS — BP 122/82 | HR 70 | Ht 69.0 in | Wt 206.0 lb

## 2022-04-17 DIAGNOSIS — M9902 Segmental and somatic dysfunction of thoracic region: Secondary | ICD-10-CM

## 2022-04-17 DIAGNOSIS — M25312 Other instability, left shoulder: Secondary | ICD-10-CM | POA: Diagnosis not present

## 2022-04-17 DIAGNOSIS — M9907 Segmental and somatic dysfunction of upper extremity: Secondary | ICD-10-CM | POA: Diagnosis not present

## 2022-04-17 DIAGNOSIS — M546 Pain in thoracic spine: Secondary | ICD-10-CM | POA: Diagnosis not present

## 2022-04-17 DIAGNOSIS — Z89421 Acquired absence of other right toe(s): Secondary | ICD-10-CM | POA: Diagnosis not present

## 2022-04-17 DIAGNOSIS — M9908 Segmental and somatic dysfunction of rib cage: Secondary | ICD-10-CM | POA: Diagnosis not present

## 2022-04-17 DIAGNOSIS — E1142 Type 2 diabetes mellitus with diabetic polyneuropathy: Secondary | ICD-10-CM | POA: Diagnosis not present

## 2022-04-17 DIAGNOSIS — G8929 Other chronic pain: Secondary | ICD-10-CM

## 2022-04-17 MED ORDER — MELOXICAM 15 MG PO TABS: 15.0000 mg | ORAL_TABLET | Freq: Every day | ORAL | 0 refills | Status: DC

## 2022-04-17 NOTE — Patient Instructions (Addendum)
Good to see you Shoulder HEP  - Start meloxicam 15 mg daily x2 weeks.  If still having pain after 2 weeks, complete 3rd-week of meloxicam. May use remaining meloxicam as needed once daily for pain control.  Do not to use additional NSAIDs while taking meloxicam.  May use Tylenol 500-1000 mg 2 to 3 times a day for breakthrough pain. 3-4 week follow up  

## 2022-04-17 NOTE — Progress Notes (Signed)
Melinda [name] D.[place] Phone: [phone]   Assessment and Plan:     1. Dyskinesis of left scapula 2. Chronic left-sided thoracic back pain -Chronic with exacerbation, initial sports medicine visit - History of chronic back pain with 1 week flare after using push mower to cut grass - Suspect the patient's underlying conditions of left-sided scapular dyskinesis and increased thoracic kyphosis have contributed to her flare - Start meloxicam 15 mg daily x2 weeks.  If still having pain after 2 weeks, complete 3rd-week of meloxicam. May use remaining meloxicam as needed once daily for pain control.  Do not to use additional NSAIDs while taking meloxicam.  May use Tylenol [phone] mg 2 to 3 times a day for breakthrough pain. - Start HEP for scapular dyskinesis - Patient elected for trigger point injections.  Tolerated well per note below.  Trigger Point Injection: After informed consent was obtained, skin cleaned with alcohol  prep.  A total of 4 trigger points identified along left-sided rhomboid and left thoracic paraspinal.  Injections given over area of pain for total injection of 4 ml lidocaine 1% w/o epi.  Patient had relief after the injection without side effects.  Pt given signs of infection to watch for.  - Patient elected for initial OMT today.  Tolerated well per note below. - Decision today to treat with OMT was based on Physical Exam  After verbal consent patient was treated with HVLA (high velocity low amplitude), ME (muscle energy), FPR (flex positional release), ST (soft tissue), PC/PD (Pelvic Compression/ Pelvic Decompression) techniques in upper extremity, rib, thoracic areas. Patient tolerated the procedure well with improvement in symptoms.  Patient educated on potential side effects of soreness and recommended to rest, hydrate, and use Tylenol as needed for pain control.   Other orders -  meloxicam (MOBIC) 15 MG tablet; Take 1 tablet (15 mg total) by mouth daily.    Pertinent previous records reviewed include CT lumbar spine 11/15/2021, CT thoracic spine 11/15/2021,   Follow Up: 4 weeks for reevaluation.  Could consider repeat OMT if patient found today's treatment beneficial.  Could repeat trigger point injections if patient found beneficial.  Could consider physical therapy.   Subjective:   I, Melinda [name], am serving as a Education administrator for Doctor Glennon Mac  Chief Complaint: left sided back and shoulder pain   HPI:   04/17/22 Patient is a 66 year old female complaining of left sided back and shoulder pain. Patient states chiro used to described it as a rib head that was out of alignment , if she sits with her arms crossed she will have back pain due the back of the chair not being high enough when she has pressure on it she has a little bit of relief, is using CBD , recently took her pain pump out , raising her left arm like trying to drive is painful, tue the wrist over is also painful , hard time getting comfortable when she is gong to sleep, thinks when she was using her using her push mower is when she aggravated it , a week ago , does not want pain meds , Maybe OMT    Relevant Historical Information: Chronic back pain, lumbar DDD requiring intrathecal pain pump which was removed earlier this year.  DM type II  Additional pertinent review of systems negative.   Current Outpatient Medications:    ACCU-CHEK GUIDE test strip, USE 1 EACH BY OTHER ROUTE  3 (THREE) TIMES DAILY. USE AS INSTRUCTE, Disp: 200 strip, Rfl: 1   Accu-Chek Softclix Lancets lancets, USE TO CHECK BLOOD SUGAR 1 TIME DAILY, Disp: 100 each, Rfl: 2   albuterol (VENTOLIN HFA) 108 (90 Base) MCG/ACT inhaler, Inhale 2 puffs into the lungs every 4 (four) hours as needed for wheezing or shortness of breath., Disp: 1 each, Rfl: 0   Ascorbic Acid (VITAMIN C) 1000 MG tablet, Take 1,000 mg by mouth daily., Disp: ,  Rfl:    ASPIRIN 81 PO, Take 81 mg by mouth daily., Disp: , Rfl:    benzonatate (TESSALON) 100 MG capsule, Take 1 capsule (100 mg total) by mouth every 8 (eight) hours., Disp: 21 capsule, Rfl: 0   Biotin 10000 MCG TABS, Take 10,000 mcg by mouth daily., Disp: , Rfl:    Blood Glucose Calibration (ACCU-CHEK GUIDE CONTROL) LIQD, 1 each by In Vitro route as needed., Disp: 1 each, Rfl: 1   buPROPion (WELLBUTRIN XL) 150 MG 24 hr tablet, TAKE 1 TABLET BY MOUTH EVERY DAY, Disp: 90 tablet, Rfl: 0   CALCIUM-MAGNESIUM-ZINC PO, Take 3 capsules by mouth daily., Disp: , Rfl:    CINNAMON PO, Take 1,000 mg by mouth daily., Disp: , Rfl:    Coenzyme Q10 (CO Q 10 PO), Take 1 tablet by mouth daily., Disp: , Rfl:    ezetimibe (ZETIA) 10 MG tablet, TAKE 1 TABLET BY MOUTH EVERY DAY, Disp: 90 tablet, Rfl: 0   fludrocortisone (FLORINEF) 0.1 MG tablet, Take 1 tablet (0.1 mg total) by mouth daily. (Patient taking differently: Take 0.1 mg by mouth every morning.), Disp: 90 tablet, Rfl: 3   glucosamine-chondroitin 500-400 MG tablet, Take 2 tablets by mouth daily. , Disp: , Rfl:    Insulin Pen Needle 31G X 5 MM MISC, BD Pen Needles- brand specific Inject insulin via insulin pen 6 x daily, Disp: 100 each, Rfl: 2   Magnesium 400 MG CAPS, Take 400 mg by mouth at bedtime., Disp: , Rfl:    meloxicam (MOBIC) 15 MG tablet, Take 1 tablet (15 mg total) by mouth daily., Disp: 30 tablet, Rfl: 0   midodrine (PROAMATINE) 5 MG tablet, Take 1 tablet (5 mg total) by mouth 3 (three) times daily with meals., Disp: 270 tablet, Rfl: 3   nitroGLYCERIN (NITROSTAT) 0.4 MG SL tablet, PLACE 1 TABLET UNDER THE TONGUE EVERY 5 (FIVE) MINUTES AS NEEDED FOR CHEST PAIN., Disp: 25 tablet, Rfl: 4   Omega-3 Fatty Acids (FISH OIL) 1200 MG CAPS, Take 1,200 mg by mouth in the morning. , Disp: , Rfl:    oxyCODONE-acetaminophen (PERCOCET) 5-325 MG tablet, Take 1 tablet by mouth every 8 (eight) hours as needed for severe pain. Must last 30 days., Disp: 90 tablet, Rfl:  0   [START ON 04/24/2022] oxyCODONE-acetaminophen (PERCOCET) 5-325 MG tablet, Take 1 tablet by mouth every 8 (eight) hours as needed for severe pain. Must last 30 days., Disp: 90 tablet, Rfl: 0   OZEMPIC, 0.25 OR 0.5 MG/DOSE, 2 MG/3ML SOPN, INJECT 0.5 MG INTO THE SKIN ONCE A WEEK., Disp: 9 mL, Rfl: 0   PARoxetine (PAXIL) 20 MG tablet, TAKE 1/2 TABLET BY MOUTH DAILY IN THE MORNING, Disp: 45 tablet, Rfl: 1   polyethylene glycol (MIRALAX / GLYCOLAX) packet, Take 17 g by mouth daily., Disp: , Rfl:    pregabalin (LYRICA) 150 MG capsule, TAKE 1 CAPSULE BY MOUTH THREE TIMES A DAY, Disp: 270 capsule, Rfl: 0   rosuvastatin (CRESTOR) 20 MG tablet, TAKE 1 TABLET BY MOUTH EVERY  DAY, Disp: 90 tablet, Rfl: 3   TART CHERRY PO, Take 1 tablet by mouth daily at 6 (six) AM., Disp: , Rfl:    tiZANidine (ZANAFLEX) 4 MG tablet, TAKE 1 TABLET (4 MG TOTAL) BY MOUTH EVERY 8 (EIGHT) HOURS AS NEEDED FOR MUSCLE SPASMS, Disp: 270 tablet, Rfl: 0   vitamin E 180 MG (400 UNITS) capsule, Take 400 Units by mouth daily., Disp: , Rfl:    oxyCODONE-acetaminophen (PERCOCET) 5-325 MG tablet, Take 1 tablet by mouth every 8 (eight) hours as needed for severe pain. Must last 30 days., Disp: 90 tablet, Rfl: 0   Objective:     Vitals:   04/17/22 1352  BP: 122/82  Pulse: 70  SpO2: 98%  Weight: 206 lb (93.4 kg)  Height: '5\' 9"'$  (1.753 m)      Body mass index is 30.42 kg/m.    Physical Exam:    Gen: Appears well, nad, nontoxic and pleasant Psych: Alert and oriented, appropriate mood and affect Neuro: sensation intact, strength is 5/5 in upper and lower extremities, muscle tone wnl Skin: no susupicious lesions or rashes  Back - Normal skin, Spine with normal alignment and no deformity.  Increased thoracic kyphosis Left-sided scapular winging compared to right Mild tenderness to T4-8 vertebral process palpation.   Left thoracic paraspinous muscles are moderately tender and without spasm TTP left rhomboid, left levator  OMT  Physical Exam:  Upper extremity: Left scapular winging Rib: Ribs 4-8 stuck in inhalation on the left Thoracic: TTP paraspinal, T4-8 RLSR    Electronically signed by:  Melinda [name] D.[facility] 2:59 PM 04/17/22

## 2022-04-22 ENCOUNTER — Other Ambulatory Visit: Payer: Self-pay | Admitting: Internal Medicine

## 2022-04-23 ENCOUNTER — Other Ambulatory Visit: Payer: Self-pay | Admitting: Internal Medicine

## 2022-04-23 DIAGNOSIS — G8929 Other chronic pain: Secondary | ICD-10-CM

## 2022-04-24 NOTE — Telephone Encounter (Signed)
Requested Prescriptions  Pending Prescriptions Disp Refills  . buPROPion (WELLBUTRIN XL) 150 MG 24 hr tablet [Pharmacy Med Name: BUPROPION HCL XL 150 MG TABLET] 90 tablet 0    Sig: TAKE 1 TABLET BY MOUTH EVERY DAY     Psychiatry: Antidepressants - bupropion Failed - 04/22/2022  1:18 AM      Failed - Cr in normal range and within 360 days    Creat  Date Value Ref Range Status  01/23/2022 1.13 (H) 0.50 - 1.05 mg/dL Final   Creatinine,U  Date Value Ref Range Status  03/12/2019 131.0 mg/dL Final   Creatinine, Urine  Date Value Ref Range Status  01/23/2022 69 20 - 275 mg/dL Final         Passed - AST in normal range and within 360 days    AST  Date Value Ref Range Status  01/23/2022 27 10 - 35 U/L Final         Passed - ALT in normal range and within 360 days    ALT  Date Value Ref Range Status  01/23/2022 29 6 - 29 U/L Final         Passed - Completed PHQ-2 or PHQ-9 in the last 360 days      Passed - Last BP in normal range    BP Readings from Last 1 Encounters:  04/17/22 122/82         Passed - Valid encounter within last 6 months    Recent Outpatient Visits          3 months ago Screening for colon cancer   [facility] Jearld Fenton, NP   3 months ago Opioid-induced constipation (OIC)   [facility], Coralie Keens, NP   7 months ago Preop general physical exam   [place], [place]   7 months ago [facility] [place], [place] W, NP   10 months ago Type 2 diabetes mellitus with diabetic mononeuropathy, without long-term current use of insulin ([place])   [facility] [place], Coralie Keens, NP      Future Appointments            In 1 week Marlou Porch, Thana Farr, MD [place] [facility] [place]. [facility], [facility]   In 2 weeks Glennon Mac, [place]

## 2022-04-24 NOTE — Telephone Encounter (Signed)
Requested medication (s) are due for refill today - yes  Requested medication (s) are on the active medication list -yes  Future visit scheduled -no  Last refill: 01/25/22 #270  Notes to clinic: non delegated Rx  Requested Prescriptions  Pending Prescriptions Disp Refills   tiZANidine (ZANAFLEX) 4 MG tablet [Pharmacy Med Name: TIZANIDINE HCL 4 MG TABLET] 270 tablet 0    Sig: TAKE 1 TABLET (4 MG TOTAL) BY MOUTH EVERY 8 (EIGHT) HOURS AS NEEDED FOR MUSCLE SPASMS     Not Delegated - Cardiovascular:  Alpha-2 Agonists - tizanidine Failed - 04/23/2022  1:29 AM      Failed - This refill cannot be delegated      Passed - Valid encounter within last 6 months    Recent Outpatient Visits           3 months ago Screening for colon cancer   [facility] [place], Coralie Keens, NP   3 months ago Opioid-induced constipation (OIC)   [facility], Coralie Keens, NP   7 months ago Preop general physical exam   [place], [place]   7 months ago [facility] [place], [place], NP   10 months ago Type 2 diabetes mellitus with diabetic mononeuropathy, without long-term current use of insulin ([place])   [facility], Coralie Keens, NP       Future Appointments             In 1 week Marlou Porch, Thana Farr, MD [place] [facility] [place]. [facility], [facility]   In 2 weeks Glennon Mac, DO [facility]               Requested Prescriptions  Pending Prescriptions Disp Refills   tiZANidine (ZANAFLEX) 4 MG tablet [Pharmacy Med Name: TIZANIDINE HCL 4 MG TABLET] 270 tablet 0    Sig: TAKE 1 TABLET (4 MG TOTAL) BY MOUTH EVERY 8 (EIGHT) HOURS AS NEEDED FOR MUSCLE SPASMS     Not Delegated - Cardiovascular:  Alpha-2 Agonists - tizanidine Failed - 04/23/2022  1:29 AM      Failed - This refill cannot be delegated      Passed - Valid encounter within last 6 months     Recent Outpatient Visits           3 months ago Screening for colon cancer   [facility] [place], Coralie Keens, NP   3 months ago Opioid-induced constipation (OIC)   [facility], Coralie Keens, NP   7 months ago Preop general physical exam   [place], [place]   7 months ago [facility] [place], [place], NP   10 months ago Type 2 diabetes mellitus with diabetic mononeuropathy, without long-term current use of insulin ([place])   [facility] [place], Coralie Keens, NP       Future Appointments             In 1 week Marlou Porch, Thana Farr, MD [place] [facility], [facility]   In 2 weeks Glennon Mac, [place]

## 2022-04-30 ENCOUNTER — Ambulatory Visit
Admission: RE | Admit: 2022-04-30 | Discharge: 2022-04-30 | Disposition: A | Discharge status: Home / Self Care | Payer: Medicare Other | Source: Ambulatory Visit | Attending: Internal Medicine | Admitting: Internal Medicine

## 2022-04-30 DIAGNOSIS — Z1231 Encounter for screening mammogram for malignant neoplasm of breast: Secondary | ICD-10-CM

## 2022-04-30 DIAGNOSIS — Z780 Asymptomatic menopausal state: Secondary | ICD-10-CM | POA: Diagnosis not present

## 2022-05-02 ENCOUNTER — Ambulatory Visit: Payer: Medicare Other | Attending: Cardiology | Admitting: Cardiology

## 2022-05-02 ENCOUNTER — Encounter: Payer: Self-pay | Admitting: Cardiology

## 2022-05-02 VITALS — BP 120/80 | HR 67 | Ht 69.0 in | Wt 216.0 lb

## 2022-05-02 DIAGNOSIS — E7800 Pure hypercholesterolemia, unspecified: Secondary | ICD-10-CM

## 2022-05-02 DIAGNOSIS — I2510 Atherosclerotic heart disease of native coronary artery without angina pectoris: Secondary | ICD-10-CM

## 2022-05-02 NOTE — Patient Instructions (Signed)
Medication Instructions:  The current medical regimen is effective;  continue present plan and medications.  *If you need a refill on your cardiac medications before your next appointment, please call your pharmacy*  Follow-Up: At [facility], you and your health needs are our priority.  As part of our continuing mission to provide you with exceptional heart care, we have created designated Provider Care Teams.  These Care Teams include your primary Cardiologist (physician) and Advanced Practice Providers (APPs -  Physician Assistants and Nurse Practitioners) who all work together to provide you with the care you need, when you need it.  We recommend signing up for the patient portal called "MyChart".  Sign up information is provided on this After Visit Summary.  MyChart is used to connect with patients for Virtual Visits (Telemedicine).  Patients are able to view lab/test results, encounter notes, upcoming appointments, etc.  Non-urgent messages can be sent to your provider as well.   To learn more about what you can do with MyChart, go to [email or web address].    Your next appointment:   1 year(s)  The format for your next appointment:   In Person  Provider:   Mark Skains, MD      Important Information About Sugar       

## 2022-05-02 NOTE — Progress Notes (Signed)
Cardiology Office Note:    Date:  05/02/2022   ID:  Melinda [name], DOB June 26, 1956, MRN [id number]  PCP:  Jearld Fenton, NP   [facility] Providers Cardiologist:  Candee Furbish, MD     Referring MD: Jearld Fenton, NP    History of Present Illness:    Melinda [name] is a 66 y.o. female with PMHx of CAD, HLD, DM, carotid disease, aortic atherosclerosis, CKD, OSA, WPW syndrome, orthostatic hypotension, and fibromyalgia who presents to clinic today for follow up of orthostatic hypotension and CAD.  At her last visit with me on 01/22/22, she continued to have severe episodes of orthostasis. Her Midodrine was increased to TID use at that time. She continued on Florinef 0.'1mg'$  daily.  Today, she states that she has been doing well overall. She has had a few episodes of orthostasis but overall this has improved significantly.  She reports occasional episodes of chest pain which radiate into her neck. She feels that this is related to indigestion. The pain occurs randomly with no clear exertional triggers.  The patient denies chest pain, chest pressure, dyspnea at rest or with exertion, PND, orthopnea, or leg swelling. Denies cough, fever, chills, nausea, or vomiting. Denies syncope, presyncope, or snoring. Denies dizziness or lightheadedness.   She states that she has been weaning off of her Percocet. She reports that she has been on narcotics since 1994 but would like to be off of them completely. She is followed by pain management.  Past Medical History:  Diagnosis Date   Abdominal wall seroma    Amputation of great toe, right, traumatic ([place]) 05/30/2010   Amputation of second toe, right, traumatic ([place]) 09/2017   Anginal pain (HCC)    Anxiety    Aortic atherosclerosis ([place])    Bilateral carotid artery stenosis 12/24/2014   a.) Doppler 12/24/2014: 60-79% BILATERAL ICA. b.) Doppler 06/28/2015: 40-59% BILATERAL ICA. c.) Dopplers 04/23/2019, 04/22/2020, 10/20/2021: 40-59% RICA and 4-13%  LICA.   Blue toes    2nd toe on right foot, will get appt.   Bulging disc    CAD (coronary artery disease) 2009   a.) PCI with placement of a 15 x 30 mm Promus DES to mLAD. b.) LHC 02/27/2011: EF 60%; no sig CAD; mild lum irreg at ostium of diagnoal (jailed by stent).   Chicken pox    Chronic fatigue    Chronic, continuous use of opioids    CKD (chronic kidney disease), stage III ([place])    COVID 02/22/2022   Degenerative disc disease    Depression    Diverticulosis    Facet joint disease    Failed cervical fusion    Fibromyalgia    Hiatal hernia    Hyperlipidemia    IBS (irritable bowel syndrome)    MCL deficiency, knee    MRSA (methicillin resistant staph aureus) culture positive 2011   GREAT TOE RIGHT FOOT   Neuropathy 04/18/2010   Orthostatic hypotension 01/2020   a.) multiple pre-syncopal episodes; takes proamatine   OSA on CPAP 08/17/2003   Osteoarthritis    a.) back, neck, hands, knees   Restless leg syndrome    Sepsis ([place]) 09/2013   Spinal headache 11/07/2021   Spinal stenosis    T2DM (type 2 diabetes mellitus) ([place])    Wolff-Parkinson-White (WPW) syndrome 1992   a.) s/p ablations; unsuccessful. Now has rare episodes    Past Surgical History:  Procedure Laterality Date   AMPUTATION TOE Right 02/01/2017   Procedure: AMPUTATION TOE-RIGHT 2ND MPJ;  Surgeon: Albertine Patricia, DPM;  Location: [facility];  Service: Podiatry;  Laterality: Right;   ANTERIOR CERVICAL DECOMP/DISCECTOMY FUSION N/A 07/28/2014   Procedure: ANTERIOR CERVICAL DECOMPRESSION/DISCECTOMY FUSION CERVICAL 3-4,4-5,5-6 LEVELS WITH INSTRUMENTATION AND ALLOGRAFT;  Surgeon: Sinclair Ship, MD;  Location: [place];  Service: Orthopedics;  Laterality: N/A;  Anterior cervical decompression fusion, cervical 3-4, cervical 4-5, cervical 5-6 with instrumentation and allograft   BACK SURGERY     X 3 1979, 1994, 1995   BLEPHAROPLASTY Bilateral 2013   CARDIAC ELECTROPHYSIOLOGY STUDY AND ABLATION N/A 1992    CARPAL TUNNEL RELEASE Right    CHOLECYSTECTOMY  2003   COLONOSCOPY WITH PROPOFOL N/A 02/22/2022   Procedure: COLONOSCOPY WITH PROPOFOL;  Surgeon: Jonathon Bellows, MD;  Location: [facility];  Service: Gastroenterology;  Laterality: N/A;   CORONARY ANGIOPLASTY WITH STENT PLACEMENT N/A 2009   Procedure: CORONARY ANGIOPLASTY WITH STENT PLACEMENT   ENDOMETRIAL ABLATION  2007   EXCISION PARTIAL PHALANX Left 09/28/2021   Procedure: EXCISION PARTIAL PHALANX - FIRST;  Surgeon: Caroline More, DPM;  Location: [place];  Service: Podiatry;  Laterality: Left;   GALLBLADDER SURGERY     HAMMER TOE SURGERY Right 10/17/2017   Procedure: HAMMER TOE CORRECTION-4TH TOE;  Surgeon: Albertine Patricia, DPM;  Location: [place];  Service: Podiatry;  Laterality: Right;  LMA- WITH LOCAL Diabetic - diet controlled   HAND SURGERY Left    INFUSION PUMP IMPLANTATION     X2 with morphine and baclofen   INTRATHECAL PUMP REVISION N/A 04/25/2018   Procedure: Intrathecal pump replacement;  Surgeon: Clydell Hakim, MD;  Location: [place];  Service: Neurosurgery;  Laterality: N/A;  right   INTRATHECAL PUMP REVISION Right 04/25/2018   INTRATHECAL PUMP REVISION N/A 12/12/2018   Procedure: Intrathecal pump revision with exploration of pocket;  Surgeon: Clydell Hakim, MD;  Location: [place];  Service: Neurosurgery;  Laterality: N/A;  Intrathecal pump revision with exploration of pocket   INTRATHECAL PUMP REVISION N/A 03/11/2020   Procedure: INTRATHECAL PUMP REVISION;  Surgeon: [name], MD;  Location: [facility];  Service: Neurosurgery;  Laterality: N/A;   INTRATHECAL PUMP REVISION Right 12/14/2021   Procedure: INTRATHECAL PUMP REMOVAL;  Surgeon: [name], MD;  Location: [facility];  Service: Neurosurgery;  Laterality: Right;   IRRIGATION AND DEBRIDEMENT FOOT Right 02/23/2017   Procedure: IRRIGATION AND DEBRIDEMENT FOOT-right foot;  Surgeon: Samara Deist, DPM;  Location: [facility];  Service: Podiatry;   Laterality: Right;   KNEE SURGERY Right 05/24/2020   LEFT HEART CATH AND CORONARY ANGIOGRAPHY Left 02/27/2011   Procedure: LEFT HEART CATH AND CORONARY ANGIOGRAPHY   PAIN PUMP REVISION N/A 08/15/2018   Procedure: Intrathecal PUMP REVISION;  Surgeon: Clydell Hakim, MD;  Location: [place];  Service: Neurosurgery;  Laterality: N/A;  INTRATHECAL PUMP REVISION   PLANTAR FASCIA SURGERY Bilateral    TOE AMPUTATION Right    RIGHT great toe (1st digit)   TOE AMPUTATION Right    a.) RIGHT second toe (2nd digit)   TOE SURGERY     then revision 8/18   TOTAL KNEE ARTHROPLASTY Right 05/24/2020   Procedure: RIGHT TOTAL KNEE ARTHROPLASTY;  Surgeon: [place] Nakayama, MD;  Location: [facility];  Service: Orthopedics;  Laterality: Right;   TOTAL KNEE REVISION Right 09/06/2020   : RIGHT KNEE POLY REVISION;  [place] Nakayama, MD) PLAN TO DISCHARGE FROM PACU   WOUND DEBRIDEMENT Left 09/28/2021   Procedure: DEBRIDEMENT SKIN, SUBCUTANEOUS TISSUE - 13244;  Surgeon: Caroline More, DPM;  Location: [place];  Service: Podiatry;  Laterality: Left;  Diabetic    Current Medications: Current Meds  Medication Sig   ACCU-CHEK GUIDE test strip USE 1 EACH BY OTHER ROUTE 3 (THREE) TIMES DAILY. USE AS INSTRUCTE   Accu-Chek Softclix Lancets lancets USE TO CHECK BLOOD SUGAR 1 TIME DAILY   albuterol (VENTOLIN HFA) 108 (90 Base) MCG/ACT inhaler Inhale 2 puffs into the lungs every 4 (four) hours as needed for wheezing or shortness of breath.   Ascorbic Acid (VITAMIN C) 1000 MG tablet Take 1,000 mg by mouth daily.   ASPIRIN 81 PO Take 81 mg by mouth daily.   Biotin 10000 MCG TABS Take 10,000 mcg by mouth daily.   Blood Glucose Calibration (ACCU-CHEK GUIDE CONTROL) LIQD 1 each by In Vitro route as needed.   buPROPion (WELLBUTRIN XL) 150 MG 24 hr tablet TAKE 1 TABLET BY MOUTH EVERY DAY   CALCIUM-MAGNESIUM-ZINC PO Take 3 capsules by mouth daily.   CINNAMON PO Take 1,000 mg by mouth daily.   Coenzyme Q10 (CO Q 10 PO) Take 1  tablet by mouth daily.   ezetimibe (ZETIA) 10 MG tablet TAKE 1 TABLET BY MOUTH EVERY DAY   fludrocortisone (FLORINEF) 0.1 MG tablet Take 1 tablet (0.1 mg total) by mouth daily.   glucosamine-chondroitin 500-400 MG tablet Take 2 tablets by mouth daily.    Insulin Pen Needle 31G X 5 MM MISC BD Pen Needles- brand specific Inject insulin via insulin pen 6 x daily   Magnesium 400 MG CAPS Take 400 mg by mouth at bedtime.   meloxicam (MOBIC) 15 MG tablet Take 1 tablet (15 mg total) by mouth daily.   midodrine (PROAMATINE) 5 MG tablet Take 1 tablet (5 mg total) by mouth 3 (three) times daily with meals.   nitroGLYCERIN (NITROSTAT) 0.4 MG SL tablet PLACE 1 TABLET UNDER THE TONGUE EVERY 5 (FIVE) MINUTES AS NEEDED FOR CHEST PAIN.   Omega-3 Fatty Acids (FISH OIL) 1200 MG CAPS Take 1,200 mg by mouth in the morning.    oxyCODONE-acetaminophen (PERCOCET) 5-325 MG tablet Take 1 tablet by mouth every 8 (eight) hours as needed for severe pain. Must last 30 days.   OZEMPIC, 0.25 OR 0.5 MG/DOSE, 2 MG/3ML SOPN INJECT 0.5 MG INTO THE SKIN ONCE A WEEK.   PARoxetine (PAXIL) 20 MG tablet TAKE 1/2 TABLET BY MOUTH DAILY IN THE MORNING   polyethylene glycol (MIRALAX / GLYCOLAX) packet Take 17 g by mouth daily.   pregabalin (LYRICA) 150 MG capsule TAKE 1 CAPSULE BY MOUTH THREE TIMES A DAY   rosuvastatin (CRESTOR) 20 MG tablet TAKE 1 TABLET BY MOUTH EVERY DAY   TART CHERRY PO Take 1 tablet by mouth daily at 6 (six) AM.   tiZANidine (ZANAFLEX) 4 MG tablet TAKE 1 TABLET (4 MG TOTAL) BY MOUTH EVERY 8 (EIGHT) HOURS AS NEEDED FOR MUSCLE SPASMS   vitamin E 180 MG (400 UNITS) capsule Take 400 Units by mouth daily.     Allergies:   Patient has no known allergies.   Social History   Socioeconomic History   Marital status: Significant Other    Spouse name: Not on file   Number of children: Not on file   Years of education: Not on file   Highest education level: Not on file  Occupational History   Not on file  Tobacco Use    Smoking status: Former    Packs/day: 1.50    Years: 32.00    Total pack years: 48.00    Types: Cigarettes    Quit date:  07/22/2007    Years since quitting: 14.7   Smokeless tobacco: Never  Vaping Use   Vaping Use: Never used  Substance and Sexual Activity   Alcohol use: Yes    Comment: occ - Holidays   Drug use: Never    Comment: prescribed pain pump and oxy   Sexual activity: Yes  Other Topics Concern   Not on file  Social History Narrative   Lives at home with a partner. Independent at baseline   Social Determinants of Health   Financial Resource Strain: Low Risk  (10/05/2021)   Overall Financial Resource Strain (CARDIA)    Difficulty of Paying Living Expenses: Not hard at all  Food Insecurity: No Food Insecurity (10/05/2021)   Hunger Vital Sign    Worried About Running Out of Food in the Last Year: Never true    Ran Out of Food in the Last Year: Never true  Transportation Needs: No Transportation Needs (10/05/2021)   PRAPARE - Hydrologist (Medical): No    Lack of Transportation (Non-Medical): No  Physical Activity: Inactive (10/05/2021)   Exercise Vital Sign    Days of Exercise per Week: 0 days    Minutes of Exercise per Session: 0 min  Stress: Not on file  Social Connections: Moderately Isolated (10/05/2021)   Social Connection and Isolation Panel [NHANES]    Frequency of Communication with Friends and Family: More than three times a week    Frequency of Social Gatherings with Friends and Family: Never    Attends Religious Services: Never    Marine scientist or Organizations: No    Attends Music therapist: Never    Marital Status: Living with partner     Family History: The patient's family history includes Alcohol abuse in her paternal grandfather; Arthritis in her father; Cancer in her mother; Dementia in her maternal grandmother; Diabetes in her father; Heart disease in her father and paternal grandfather; Lung cancer  in her mother; Stroke in her paternal grandfather.  ROS:   Please see the history of present illness. (+) Indigestion All other systems reviewed and are negative.  EKGs/Labs/Other Studies Reviewed:    The following studies were reviewed today:  Carotid US 10/20/21: Summary:  Right Carotid: Velocities in the right ICA are consistent with a 40-59% stenosis. The ECA appears >50% stenosed. Left Carotid: Velocities in the left ICA are consistent with a 1-39%  stenosis. Vertebrals:  Bilateral vertebral arteries demonstrate antegrade flow.  Subclavians: Normal flow hemodynamics were seen in bilateral subclavian arteries.   NM stress 10/12/21:   Findings are consistent with no prior ischemia. The study is low risk.   No ST deviation was noted.   Left ventricular function is normal. Nuclear stress EF: 69 %. The left ventricular ejection fraction is hyperdynamic (>65%). End diastolic cavity size is normal.   Prior study available for comparison from 06/04/2019. Findings: Negative for stress induced arrhythmias. No evidence of ischemia. Small apical perfusion defect in rest and stress with normal wall motion likely related to apical thinning. Conclusions: Negative stress test. Low risk study. No significant change from prior.  Echo 04/22/2019: 1. Left ventricular ejection fraction, by visual estimation, is 60 to 65%. The left ventricle has normal function. Normal left ventricular size. There is no left ventricular hypertrophy. Normal wall motion. Normal diastolic function. 2. Global right ventricle has normal systolic function.The right ventricular size is normal. No increase in right ventricular wall thickness. 3. Left atrial size was  normal. 4. Right atrial size was normal. 5. The mitral valve is normal in structure. No evidence of mitral valve regurgitation. No evidence of mitral stenosis. 6. The tricuspid valve is normal in structure. Tricuspid valve regurgitation is trivial. 7. The  aortic valve is tricuspid Aortic valve regurgitation was not visualized by color flow Doppler. Structurally normal aortic valve, with no evidence of sclerosis or stenosis. 8. The tricuspid regurgitant velocity is 2.59 m/s, and with an assumed right atrial pressure of 8 mmHg, the estimated right ventricular systolic pressure is mildly elevated at 34.8 mmHg. 9. The inferior vena cava is normal in size with greater than 50% respiratory variability, suggesting right atrial pressure of 3 mmHg. In comparison to the previous echocardiogram(s): 09/12/15 EF 65-70%.  EKG:   EKG was not ordered today, 05/02/22.   Recent Labs: 01/23/2022: ALT 29; BUN 27; Creat 1.13; Hemoglobin 12.9; Platelets 202; Potassium 4.7; Sodium 143  Recent Lipid Panel    Component Value Date/Time   CHOL 98 01/23/2022 1433   TRIG 155 (H) 01/23/2022 1433   HDL 39 (L) 01/23/2022 1433   CHOLHDL 2.5 01/23/2022 1433   VLDL 31.6 03/12/2019 1255   LDLCALC 36 01/23/2022 1433   LDLDIRECT 63.0 11/04/2017 1326     Risk Assessment/Calculations:              Physical Exam:    VS:  BP 120/80 (BP Location: Left Arm, Patient Position: Sitting, Cuff Size: Normal)   Pulse 67   Ht '5\' 9"'$  (1.753 m)   Wt 216 lb (98 kg)   LMP  (LMP Unknown)   SpO2 97%   BMI 31.90 kg/m     Wt Readings from Last 3 Encounters:  05/02/22 216 lb (98 kg)  04/17/22 206 lb (93.4 kg)  02/22/22 199 lb (90.3 kg)     GEN:  Well nourished, well developed in no acute distress HEENT: Normal NECK: No JVD; No carotid bruits LYMPHATICS: No lymphadenopathy CARDIAC: RRR, no murmurs, no rubs, gallops RESPIRATORY:  Clear to auscultation without rales, wheezing or rhonchi  ABDOMEN: Soft, non-tender, non-distended MUSCULOSKELETAL:  No edema; No deformity  SKIN: Warm and dry NEUROLOGIC:  Alert and oriented x 3 PSYCHIATRIC:  Normal affect   ASSESSMENT:    1. Coronary artery disease involving native coronary artery of native heart without angina pectoris   2.  Pure hypercholesterolemia     PLAN:    In order of problems listed above:   CAD - DES to LAD 2009 cardiac cath 2012 patent stent stress test 2020 low risk.  Continue with goal-directed medical therapy which includes Crestor 20 Zetia 10 aspirin 81.  Doing well.  She occasionally have some chest discomfort/epigastric discomfort which she thinks is stomach related.  Certainly could be.  Hypotension orthostasis - We are going to increase her midodrine to 5 mg 3 times a day.  Continue Florinef 0.1 mg a day. -Salt and fluid liberalization.  At next appointment we may be able to start pulling off the Florinef if her blood pressures are improved as she gradually continues to decrease her narcotics.  Hypercholesterolemia - Zetia and Crestor with dosing as above.  LDL 36.  Excellent.  Doing very well.  Protective.  Goal-directed medical therapy.  Carotid artery stenosis bilateral - Mild to moderate disease.  Continue with statin.  On aspirin as well.   Follow up: 12 months   Medication Adjustments/Labs and Tests Ordered: Current medicines are reviewed at length with the patient today.  Concerns regarding medicines  are outlined above.  No orders of the defined types were placed in this encounter.  No orders of the defined types were placed in this encounter.   Patient Instructions  Medication Instructions:  The current medical regimen is effective;  continue present plan and medications.  *If you need a refill on your cardiac medications before your next appointment, please call your pharmacy*  Follow-Up: At [facility], you and your health needs are our priority.  As part of our continuing mission to provide you with exceptional heart care, we have created designated Provider Care Teams.  These Care Teams include your primary Cardiologist (physician) and Advanced Practice Providers (APPs -  Physician Assistants and Nurse Practitioners) who all work together to provide you with  the care you need, when you need it.  We recommend signing up for the patient portal called "MyChart".  Sign up information is provided on this After Visit Summary.  MyChart is used to connect with patients for Virtual Visits (Telemedicine).  Patients are able to view lab/test results, encounter notes, upcoming appointments, etc.  Non-urgent messages can be sent to your provider as well.   To learn more about what you can do with MyChart, go to [email or web address].    Your next appointment:   1 year(s)  The format for your next appointment:   In Person  Provider:   Candee Furbish, MD      Important Information About Sugar          I,Alexis Herring,acting as a scribe for Candee Furbish, MD.,have documented all relevant documentation on the behalf of Candee Furbish, MD,as directed by  Candee Furbish, MD while in the presence of Candee Furbish, MD.  I, Candee Furbish, MD, have reviewed all documentation for this visit. The documentation on 05/02/22 for the exam, diagnosis, procedures, and orders are all accurate and complete.   Signed, Candee Furbish, MD  05/02/2022 1:52 PM    [facility]

## 2022-05-07 NOTE — Progress Notes (Deleted)
Benito Mccreedy D.[place] Phone: [phone]   Assessment and Plan:     There are no diagnoses linked to this encounter.  ***   Pertinent previous records reviewed include ***   Follow Up: ***     Subjective:   I, Katara Griner, am serving as a Education administrator for Doctor Glennon Mac   Chief Complaint: left sided back and shoulder pain    HPI:    04/17/22 Patient is a 66 year old female complaining of left sided back and shoulder pain. Patient states chiro used to described it as a rib head that was out of alignment , if she sits with her arms crossed she will have back pain due the back of the chair not being high enough when she has pressure on it she has a little bit of relief, is using CBD , recently took her pain pump out , raising her left arm like trying to drive is painful, tue the wrist over is also painful , hard time getting comfortable when she is gong to sleep, thinks when she was using her using her push mower is when she aggravated it , a week ago , does not want pain meds , Maybe OMT    05/08/2022 Patient states    Relevant Historical Information: Chronic back pain, lumbar DDD requiring intrathecal pain pump which was removed earlier this year.  DM type II  Additional pertinent review of systems negative.   Current Outpatient Medications:    ACCU-CHEK GUIDE test strip, USE 1 EACH BY OTHER ROUTE 3 (THREE) TIMES DAILY. USE AS INSTRUCTE, Disp: 200 strip, Rfl: 1   Accu-Chek Softclix Lancets lancets, USE TO CHECK BLOOD SUGAR 1 TIME DAILY, Disp: 100 each, Rfl: 2   albuterol (VENTOLIN HFA) 108 (90 Base) MCG/ACT inhaler, Inhale 2 puffs into the lungs every 4 (four) hours as needed for wheezing or shortness of breath., Disp: 1 each, Rfl: 0   Ascorbic Acid (VITAMIN C) 1000 MG tablet, Take 1,000 mg by mouth daily., Disp: , Rfl:    ASPIRIN 81 PO, Take 81 mg by mouth daily., Disp: , Rfl:    Biotin  10000 MCG TABS, Take 10,000 mcg by mouth daily., Disp: , Rfl:    Blood Glucose Calibration (ACCU-CHEK GUIDE CONTROL) LIQD, 1 each by In Vitro route as needed., Disp: 1 each, Rfl: 1   buPROPion (WELLBUTRIN XL) 150 MG 24 hr tablet, TAKE 1 TABLET BY MOUTH EVERY DAY, Disp: 90 tablet, Rfl: 0   CALCIUM-MAGNESIUM-ZINC PO, Take 3 capsules by mouth daily., Disp: , Rfl:    CINNAMON PO, Take 1,000 mg by mouth daily., Disp: , Rfl:    Coenzyme Q10 (CO Q 10 PO), Take 1 tablet by mouth daily., Disp: , Rfl:    ezetimibe (ZETIA) 10 MG tablet, TAKE 1 TABLET BY MOUTH EVERY DAY, Disp: 90 tablet, Rfl: 0   fludrocortisone (FLORINEF) 0.1 MG tablet, Take 1 tablet (0.1 mg total) by mouth daily., Disp: 90 tablet, Rfl: 3   glucosamine-chondroitin 500-400 MG tablet, Take 2 tablets by mouth daily. , Disp: , Rfl:    Insulin Pen Needle 31G X 5 MM MISC, BD Pen Needles- brand specific Inject insulin via insulin pen 6 x daily, Disp: 100 each, Rfl: 2   Magnesium 400 MG CAPS, Take 400 mg by mouth at bedtime., Disp: , Rfl:    meloxicam (MOBIC) 15 MG tablet, Take 1 tablet (15 mg total) by mouth daily.,  Disp: 30 tablet, Rfl: 0   midodrine (PROAMATINE) 5 MG tablet, Take 1 tablet (5 mg total) by mouth 3 (three) times daily with meals., Disp: 270 tablet, Rfl: 3   nitroGLYCERIN (NITROSTAT) 0.4 MG SL tablet, PLACE 1 TABLET UNDER THE TONGUE EVERY 5 (FIVE) MINUTES AS NEEDED FOR CHEST PAIN., Disp: 25 tablet, Rfl: 4   Omega-3 Fatty Acids (FISH OIL) 1200 MG CAPS, Take 1,200 mg by mouth in the morning. , Disp: , Rfl:    oxyCODONE-acetaminophen (PERCOCET) 5-325 MG tablet, Take 1 tablet by mouth every 8 (eight) hours as needed for severe pain. Must last 30 days., Disp: 90 tablet, Rfl: 0   OZEMPIC, 0.25 OR 0.5 MG/DOSE, 2 MG/3ML SOPN, INJECT 0.5 MG INTO THE SKIN ONCE A WEEK., Disp: 9 mL, Rfl: 0   PARoxetine (PAXIL) 20 MG tablet, TAKE 1/2 TABLET BY MOUTH DAILY IN THE MORNING, Disp: 45 tablet, Rfl: 1   polyethylene glycol (MIRALAX / GLYCOLAX) packet,  Take 17 g by mouth daily., Disp: , Rfl:    pregabalin (LYRICA) 150 MG capsule, TAKE 1 CAPSULE BY MOUTH THREE TIMES A DAY, Disp: 270 capsule, Rfl: 0   rosuvastatin (CRESTOR) 20 MG tablet, TAKE 1 TABLET BY MOUTH EVERY DAY, Disp: 90 tablet, Rfl: 3   TART CHERRY PO, Take 1 tablet by mouth daily at 6 (six) AM., Disp: , Rfl:    tiZANidine (ZANAFLEX) 4 MG tablet, TAKE 1 TABLET (4 MG TOTAL) BY MOUTH EVERY 8 (EIGHT) HOURS AS NEEDED FOR MUSCLE SPASMS, Disp: 270 tablet, Rfl: 0   vitamin E 180 MG (400 UNITS) capsule, Take 400 Units by mouth daily., Disp: , Rfl:    Objective:     There were no vitals filed for this visit.    There is no height or weight on file to calculate BMI.    Physical Exam:    ***   Electronically signed by:  Benito Mccreedy D.[facility] 12:31 PM 05/07/22

## 2022-05-08 ENCOUNTER — Ambulatory Visit: Payer: Medicare Other | Admitting: Sports Medicine

## 2022-05-10 ENCOUNTER — Other Ambulatory Visit: Payer: Self-pay | Admitting: Internal Medicine

## 2022-05-10 NOTE — Telephone Encounter (Signed)
Requested Prescriptions  Pending Prescriptions Disp Refills   OZEMPIC, 0.25 OR 0.5 MG/DOSE, 2 MG/3ML SOPN [Pharmacy Med Name: OZEMPIC 0.25-0.5 MG/DOSE PEN]      Sig: INJECT 0.5 MG INTO THE SKIN ONCE A WEEK.     Endocrinology:  Diabetes - GLP-1 Receptor Agonists - semaglutide Failed - 05/10/2022  3:00 AM      Failed - HBA1C in normal range and within 180 days    Hgb A1c MFr Bld  Date Value Ref Range Status  01/23/2022 5.7 (H) <5.7 % of total Hgb Final    Comment:    For someone without known diabetes, a hemoglobin  A1c value between 5.7% and 6.4% is consistent with prediabetes and should be confirmed with a  follow-up test. . For someone with known diabetes, a value <7% indicates that their diabetes is well controlled. A1c targets should be individualized based on duration of diabetes, age, comorbid conditions, and other considerations. . This assay result is consistent with an increased risk of diabetes. . Currently, no consensus exists regarding use of hemoglobin A1c for diagnosis of diabetes for children. .          Failed - Cr in normal range and within 360 days    Creat  Date Value Ref Range Status  01/23/2022 1.13 (H) 0.50 - 1.05 mg/dL Final   Creatinine,U  Date Value Ref Range Status  03/12/2019 131.0 mg/dL Final   Creatinine, Urine  Date Value Ref Range Status  01/23/2022 69 20 - 275 mg/dL Final         Passed - Valid encounter within last 6 months    Recent Outpatient Visits           3 months ago Screening for colon cancer   [facility] [place], Coralie Keens, NP   4 months ago Opioid-induced constipation (OIC)   [facility], Coralie Keens, NP   7 months ago Preop general physical exam   [place], [place]   8 months ago [facility] [place], [place] W, NP   10 months ago Type 2 diabetes mellitus with diabetic mononeuropathy, without long-term current use of insulin [facility])    [facility] [place], Coralie Keens, NP

## 2022-05-14 ENCOUNTER — Other Ambulatory Visit: Payer: Self-pay | Admitting: Sports Medicine

## 2022-05-15 NOTE — Progress Notes (Unsigned)
PROVIDER NOTE: Information contained herein reflects review and annotations entered in association with encounter. Interpretation of such information and data should be left to medically-trained personnel. Information provided to patient can be located elsewhere in the medical record under "Patient Instructions". Document created using STT-dictation technology, any transcriptional errors that may result from process are unintentional.    Patient: Melinda [name]  Service Category: E/M  Provider: Gaspar Cola, MD  DOB: 30-May-1956  DOS: 05/16/2022  Referring Provider: Jearld Fenton, NP  MRN: [id number]  Specialty: Interventional Pain Management  PCP: Jearld Fenton, NP  Type: Established Patient  Setting: Ambulatory outpatient    Location: Office  Delivery: Face-to-face     HPI  [name], a 66 y.o. year old female, is here today because of her No primary diagnosis found.. Melinda [name]'s primary complain today is No chief complaint on file. Last encounter: My last encounter with her was on 01/29/2022. Pertinent problems: Melinda [name] has Chronic pain syndrome; Failed back surgical syndrome; Failed cervical surgery syndrome (ACDF); Chronic neck pain; Chronic low back pain (Bilateral) (L>R) w/ sciatica (Right); Epidural fibrosis; Cervical spondylosis; Chronic lower extremity pain (Left); Chronic lumbar radicular pain (Left); Neuropathic pain; Lumbar facet syndrome; Fibromyalgia; Osteoarthritis of knee (Bilateral) (R>L); Lumbar spondylosis; Lumbar spinal stenosis w/ neurogenic claudication; Abnormal MRI, lumbar spine (11/18/2019); Lumbar facet hypertrophy (Multilevel); Lumbar foraminal stenosis; Lumbar lateral recess stenosis; Chronic right-sided low back pain w/o sciatica from IT catheter anchor.; Chronic low back pain (Bilateral) w/o sciatica; DDD (degenerative disc disease), lumbosacral; Thoracic facet hypertrophy/arthropathy (Multilevel) (Bilateral); Thoracic facet syndrome (Multilevel)  (Bilateral); Malfunction of intrathecal infusion pump ([place]); Malposition of intrathecal infusion catheter; Pain and numbness of upper extremity (Bilateral); Cervicalgia; History of carpal tunnel surgery (Bilateral); Recurrent right knee instability; 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 Pain Assessment: Severity of   is reported as a  /10. Location:    / . Onset:  . Quality:  . Timing:  . Modifying factor(s):  Marland Kitchen Vitals:  vitals were not taken for this visit.   Reason for encounter: medication management. ***  Pharmacotherapy Assessment  Analgesic: Oxycodone 5 mg, 1 tab PO q 8 hrs (15 mg/day of oxycodone) + Intrathecal PF-Fentanyl MME/day: 22.5 mg/day (oral).   Monitoring: [place] PMP: PDMP reviewed during this encounter.       Pharmacotherapy: No side-effects or adverse reactions reported. Compliance: No problems identified. Effectiveness: Clinically acceptable.  No notes on file  CBD:THC Ratio  Date Value Ref Range Status  06/20/2021 <1.0 RATIO Final    Comment:    CBD and/or metabolites were not detected in this sample.  This test measures Cannabidiol (CBD) and Tetrahydrocannabinol (THC) and metabolites in urine. The CBD:THC ratio is calculated using the sums of the respective metabolites and is intended to assist in differentiating the presence of Tetrahydrocannabinol [facility]) metabolites due to the use of marijuana or medicinal THC from the presence of THC metabolites due to use of Cannabidiol (CBD) or hemp products that purportedly contain trace amounts of THC.  CBD:THC Ratio           Interpretation ---------      --------------------------------------------- >=10.0         Consistent with the use of CBD products only 1.0 -  9.9      Indeterminate <1.0           Consistent with marijuana, medicinal THC or                mixed use  Interpretive ranges are provided as guidance and should not be considered definitive. Interpretation of results should include consideration of all relevant clinical and diagnostic information.  Analysis performed by chromatography with mass spectrometry. This test was developed and its performance characteristics determined by [facility].  It has not been cleared or approved by the Food and Drug Administration.    No results found for: "D8THCCBX" No results found for: "D9THCCBX"  UDS:  Summary  Date Value Ref Range Status  12/26/2021 Note  Final    Comment:    ==================================================================== [employer] Select 13 (MW) ==================================================================== Test                             Result       Flag       Units  Drug Present and Declared for Prescription Verification   Oxycodone                      669          EXPECTED   ng/mg creat   Oxymorphone                    153          EXPECTED   ng/mg creat   Noroxycodone                   1985         EXPECTED   ng/mg creat    Sources of oxycodone include scheduled prescription medications.    Oxymorphone and noroxycodone are expected metabolites of oxycodone.    Oxymorphone is also available as a scheduled prescription medication.  Drug Absent but Declared for Prescription Verification   Fentanyl                       Not Detected UNEXPECTED ng/mg creat ==================================================================== Test                      Result    Flag   Units      Ref Range   Creatinine              112              mg/dL       >=20 ==================================================================== Declared Medications:  The flagging and interpretation on this report are based on the  following declared medications.  Unexpected results may arise from  inaccuracies in the declared medications.   **Note: The testing scope of this panel includes these medications:   Fentanyl  Oxycodone (Percocet)   **Note: The testing scope of this panel does not include the  following reported medications:   Acetaminophen (Percocet)  Aspirin  Baclofen  Biotin  Bupivacaine  Bupropion (Wellbutrin)  Calcium  Chondroitin  Cinnamon  Ezetimibe (Zetia)  Fish Oil  Fludrocortisone (Florinef)  Glucosamine  Magnesium  Midodrine (Proamatine)  Nitroglycerin (Nitrostat)  Paroxetine (Paxil)  Polyethylene Glycol (MiraLAX)  Pregabalin (Lyrica)  Rosuvastatin (Crestor)  Semaglutide (Ozempic)  Tizanidine (Zanaflex)  Ubiquinone (CoQ10)  Undefined Miscellaneous Drug  Vitamin C  Vitamin E  Zinc ==================================================================== For clinical consultation, please call [phone]. ====================================================================  ROS  Constitutional: Denies any fever or chills Gastrointestinal: No reported hemesis, hematochezia, vomiting, or acute GI distress Musculoskeletal: Denies any acute onset joint swelling, redness, loss of ROM, or weakness Neurological: No reported episodes of acute onset apraxia, aphasia, dysarthria, agnosia, amnesia, paralysis, loss of coordination, or loss of consciousness  Medication Review  Accu-Chek Guide Control, Accu-Chek Softclix Lancets, Aspirin, Biotin, Calcium-Magnesium-Zinc, Cinnamon, Coenzyme Q10, Fish Oil, Insulin Pen Needle, Magnesium, PARoxetine, Semaglutide(0.25 or 0.5MG/DOS), Tart Cherry, albuterol, buPROPion, ezetimibe, fludrocortisone, glucosamine-chondroitin, glucose blood, meloxicam, midodrine, nitroGLYCERIN,  oxyCODONE-acetaminophen, polyethylene glycol, pregabalin, rosuvastatin, tiZANidine, vitamin C, and vitamin E  History Review  Allergy: Ms. Meiser has No Known Allergies. Drug: Ms. Overbaugh  reports no history of drug use. Alcohol:  reports current alcohol use. Tobacco:  reports that she quit smoking about 14 years ago. Her smoking use included cigarettes. She has a 48.00 pack-year smoking history. She has never used smokeless tobacco. Social: Ms. Kamiya  reports that she quit smoking about 14 years ago. Her smoking use included cigarettes. She has a 48.00 pack-year smoking history. She has never used smokeless tobacco. She reports current alcohol use. She reports that she does not use drugs. 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[place]) (05/30/2010), Amputation of second toe, right, traumatic ([place]) (09/2017), Anginal pain ([place]), Anxiety, Aortic atherosclerosis ([place]), Bilateral carotid artery stenosis (12/24/2014), Blue toes, Bulging disc, CAD (coronary artery disease) (2009), Chicken pox, Chronic fatigue, Chronic, continuous use of opioids, CKD (chronic kidney disease), stage III ([place]), COVID (02/22/2022), Degenerative disc disease, Depression, Diverticulosis, Facet joint disease, Failed cervical fusion, Fibromyalgia, Hiatal hernia, Hyperlipidemia, IBS (irritable bowel syndrome), MCL deficiency, knee, MRSA (methicillin resistant staph aureus) culture positive (2011), Neuropathy (04/18/2010), Orthostatic hypotension (01/2020), OSA on CPAP (08/17/2003), Osteoarthritis, Restless leg syndrome, Sepsis ([place]) (09/2013), Spinal headache (11/07/2021), Spinal stenosis, T2DM (type 2 diabetes mellitus) ([place]), and Wolff-Parkinson-White (WPW) syndrome (1992). Surgical: Ms. Laurel  has a past surgical history that includes Toe amputation (Right); Hand surgery (Left); Gallbladder surgery; Endometrial ablation (2007); Cardiac electrophysiology study and  ablation (N/A, 1992); Plantar fascia surgery (Bilateral); Toe amputation (Right); Infusion pump implantation; Back surgery; Cholecystectomy (2003); Anterior cervical decomp/discectomy fusion (N/A, 07/28/2014); Carpal tunnel release (Right); Blepharoplasty (Bilateral, 2013); Amputation toe (Right, 02/01/2017); Irrigation and debridement foot (Right, 02/23/2017); Toe Surgery; Hammer toe surgery (Right, 10/17/2017); Intrathecal pump revision (N/A, 04/25/2018); Intrathecal pump revision (Right, 04/25/2018); Coronary angioplasty with stent (N/A, 2009); Pain pump revision (N/A, 08/15/2018); Intrathecal pump revision (N/A, 12/12/2018); Intrathecal pump revision (N/A, 03/11/2020); Knee surgery (Right, 05/24/2020); Total knee arthroplasty (Right, 05/24/2020); Total knee revision (Right, 09/06/2020); Excision partial phalanx (Left, 09/28/2021); Wound debridement (Left, 09/28/2021); LEFT HEART CATH AND CORONARY ANGIOGRAPHY (Left, 02/27/2011); Intrathecal pump revision (Right, 12/14/2021); and Colonoscopy with propofol (N/A, 02/22/2022). Family: family history includes Alcohol abuse in her paternal grandfather; Arthritis in her father; Cancer in her mother; Dementia in her maternal grandmother; Diabetes in her father; Heart disease in her father and paternal grandfather; Lung cancer in her mother; Stroke in her paternal grandfather.  Laboratory Chemistry Profile   Renal Lab Results  Component Value Date   BUN 27 (H) 01/23/2022   CREATININE 1.13 (H) 01/23/2022   LABCREA 69 01/23/2022   BCR 24 (H) 01/23/2022   GFR 51.87 (L) 03/22/2020   GFRAA 65 12/21/2020   GFRNONAA 56 (L) 12/21/2020    Hepatic Lab Results  Component Value Date   AST 27 01/23/2022   ALT 29 01/23/2022   ALBUMIN 4.7 10/23/2021  ALKPHOS 103 10/23/2021   HCVAB NEGATIVE 05/18/2016   LIPASE 11 10/04/2013    Electrolytes Lab Results  Component Value Date   NA 143 01/23/2022   K 4.7 01/23/2022   CL 105 01/23/2022   CALCIUM 9.2 01/23/2022    MG 2.1 06/30/2013    Bone Lab Results  Component Value Date   VD25OH 34.99 03/12/2019    Inflammation (CRP: Acute Phase) (ESR: Chronic Phase) Lab Results  Component Value Date   CRP 1 05/06/2018   ESRSEDRATE 52 (H) 04/02/2020   LATICACIDVEN 0.9 04/01/2020         Note: Above Lab results reviewed.  Recent Imaging Review  MM 3D SCREEN BREAST BILATERAL CLINICAL DATA:  Screening.  EXAM: DIGITAL SCREENING BILATERAL MAMMOGRAM WITH TOMOSYNTHESIS AND CAD  TECHNIQUE: Bilateral screening digital craniocaudal and mediolateral oblique mammograms were obtained. Bilateral screening digital breast tomosynthesis was performed. The images were evaluated with computer-aided detection.  COMPARISON:  Previous exam(s).  ACR Breast Density Category c: The breast tissue is heterogeneously dense, which may obscure small masses.  FINDINGS: There are no findings suspicious for malignancy.  IMPRESSION: No mammographic evidence of malignancy. A result letter of this screening mammogram will be mailed directly to the patient.  RECOMMENDATION: Screening mammogram in one year. (Code:SM-B-01Y)  BI-RADS CATEGORY  1: Negative.  Electronically Signed   By: Audie Pinto M.D.   On: 05/02/2022 14:13 Note: Reviewed        Physical Exam  General appearance: Well nourished, well developed, and well hydrated. In no apparent acute distress Mental status: Alert, oriented x 3 (person, place, & time)       Respiratory: No evidence of acute respiratory distress Eyes: PERLA Vitals: LMP  (LMP Unknown)  BMI: Estimated body mass index is 31.9 kg/m as calculated from the following:   Height as of 05/02/22: _0  (1.753 m).   Weight as of 05/02/22: 216 lb (98 kg). Ideal: Ideal body weight: 66.2 kg (145 lb 15.1 oz) Adjusted ideal body weight: 78.9 kg (173 lb 15.5 oz)  Assessment   Diagnosis Status  No diagnosis found. Controlled Controlled Controlled   Updated Problems: No problems  updated.   Plan of Care  Problem-specific:  No problem-specific Assessment & Plan notes found for this encounter.  Ms. Rennee Coyne has a current medication list which includes the following long-term medication(s): albuterol, bupropion, ezetimibe, nitroglycerin, oxycodone-acetaminophen, paroxetine, pregabalin, rosuvastatin, and tizanidine.  Pharmacotherapy (Medications Ordered): No orders of the defined types were placed in this encounter.  Orders:  No orders of the defined types were placed in this encounter.  Follow-up plan:   No follow-ups on file.     Interventional Therapies  Risk  Complexity Considerations:   Estimated body mass index is 30.72 kg/m as calculated from the following:   Height as of this encounter: _1  (1.753 m).   Weight as of this encounter: 208 lb (94.3 kg). WNL   Planned  Pending:   Diagnostic/therapeutic left cervical ESI #1    Under consideration:   Possible left lumbar facet RFA  Possible right lumbar facet RFA  Diagnostic caudal ESI + diagnostic epidurogram  Possible Racz procedure  Diagnostic bilateral IA knee injection (Steroid) Diagnostic bilateral genicular NB  Possible bilateral genicular nerve RFA  Possible bilateral Hyalgan knee injection  Diagnostic cervical ESI  Diagnostic bilateral cervical facet block  Possible bilateral cervical facet RFA    Completed:   Diagnostic/therapeutic left L2-3 LESI x1  Diagnostic/therapeutic right lumbar facet block x3 (06/23/2019) (100/100/75/>75)  Diagnostic/therapeutic left lumbar facet block x1 (06/23/2019) (100/100/75/>75)    Therapeutic  Palliative (PRN) options:   Palliative/therapeutic intrathecal pump management (refills/programming adjustments)  Palliative left L2-3 LESI #2  Palliative right lumbar facet block #4  Diagnostic left lumbar facet block #2      Recent Visits No visits were found meeting these conditions. Showing recent visits within past 90 days and meeting all other  requirements Future Appointments Date Type Provider Dept  05/16/22 Appointment [name], MD [facility]  Showing future appointments within next 90 days and meeting all other requirements  I discussed the assessment and treatment plan with the patient. The patient was provided an opportunity to ask questions and all were answered. The patient agreed with the plan and demonstrated an understanding of the instructions.  Patient advised to call back or seek an in-person evaluation if the symptoms or condition worsens.  Duration of encounter: *** minutes.  Total time on encounter, as per AMA guidelines included both the face-to-face and non-face-to-face time personally spent by the physician and/or other qualified health care professional(s) on the day of the encounter (includes time in activities that require the physician or other qualified health care professional and does not include time in activities normally performed by clinical staff). Physician's time may include the following activities when performed: preparing to see the patient (eg, review of tests, pre-charting review of records) obtaining and/or reviewing separately obtained history performing a medically appropriate examination and/or evaluation counseling and educating the patient/family/caregiver ordering medications, tests, or procedures referring and communicating with other health care professionals (when not separately reported) documenting clinical information in the electronic or other health record independently interpreting results (not separately reported) and communicating results to the patient/ family/caregiver care coordination (not separately reported)  Note by: Gaspar Cola, MD Date: 05/16/2022; Time: 3:45 PM

## 2022-05-16 ENCOUNTER — Encounter: Payer: Self-pay | Admitting: Pain Medicine

## 2022-05-16 ENCOUNTER — Ambulatory Visit: Payer: Medicare Other | Attending: Pain Medicine | Admitting: Pain Medicine

## 2022-05-16 VITALS — BP 133/84 | HR 75 | Temp 97.3°F | Ht 69.0 in | Wt 205.0 lb

## 2022-05-16 DIAGNOSIS — Z79891 Long term (current) use of opiate analgesic: Secondary | ICD-10-CM | POA: Diagnosis not present

## 2022-05-16 DIAGNOSIS — M47816 Spondylosis without myelopathy or radiculopathy, lumbar region: Secondary | ICD-10-CM | POA: Diagnosis not present

## 2022-05-16 DIAGNOSIS — R937 Abnormal findings on diagnostic imaging of other parts of musculoskeletal system: Secondary | ICD-10-CM

## 2022-05-16 DIAGNOSIS — F1190 Opioid use, unspecified, uncomplicated: Secondary | ICD-10-CM

## 2022-05-16 DIAGNOSIS — M961 Postlaminectomy syndrome, not elsewhere classified: Secondary | ICD-10-CM | POA: Diagnosis not present

## 2022-05-16 DIAGNOSIS — G96198 Other disorders of meninges, not elsewhere classified: Secondary | ICD-10-CM

## 2022-05-16 DIAGNOSIS — M5137 Other intervertebral disc degeneration, lumbosacral region: Secondary | ICD-10-CM | POA: Insufficient documentation

## 2022-05-16 DIAGNOSIS — F1290 Cannabis use, unspecified, uncomplicated: Secondary | ICD-10-CM

## 2022-05-16 DIAGNOSIS — M533 Sacrococcygeal disorders, not elsewhere classified: Secondary | ICD-10-CM | POA: Diagnosis not present

## 2022-05-16 DIAGNOSIS — M25551 Pain in right hip: Secondary | ICD-10-CM | POA: Diagnosis not present

## 2022-05-16 DIAGNOSIS — M47812 Spondylosis without myelopathy or radiculopathy, cervical region: Secondary | ICD-10-CM | POA: Diagnosis not present

## 2022-05-16 DIAGNOSIS — M25552 Pain in left hip: Secondary | ICD-10-CM | POA: Diagnosis not present

## 2022-05-16 DIAGNOSIS — G894 Chronic pain syndrome: Secondary | ICD-10-CM | POA: Diagnosis not present

## 2022-05-16 DIAGNOSIS — M542 Cervicalgia: Secondary | ICD-10-CM | POA: Diagnosis not present

## 2022-05-16 DIAGNOSIS — M5441 Lumbago with sciatica, right side: Secondary | ICD-10-CM | POA: Diagnosis not present

## 2022-05-16 DIAGNOSIS — M51379 Other intervertebral disc degeneration, lumbosacral region without mention of lumbar back pain or lower extremity pain: Secondary | ICD-10-CM

## 2022-05-16 DIAGNOSIS — G8929 Other chronic pain: Secondary | ICD-10-CM | POA: Diagnosis not present

## 2022-05-16 DIAGNOSIS — Z79899 Other long term (current) drug therapy: Secondary | ICD-10-CM

## 2022-05-16 NOTE — Patient Instructions (Addendum)
  ____________________________________________________________________________________________  Patient Information update  To: All of our patients.  Re: Name change.  It has been made official that our current name, "[facility]"   will soon be changed to "[facility]".   The purpose of this change is to eliminate any confusion created by the concept of our practice being a "Medication Management Pain Clinic". In the past this has led to the misconception that we treat pain primarily by the use of prescription medications.  Nothing can be farther from the truth.   Understanding PAIN MANAGEMENT: To further understand what our practice does, you first have to understand that "Pain Management" is a subspecialty that requires additional training once a physician has completed their specialty training, which can be in either Anesthesia, Neurology, Psychiatry, or Physical Medicine and Rehabilitation (PMR). Each one of these contributes to the final approach taken by each physician to the management of their patient's pain. To be a "Pain Management Specialist" you must have first completed one of the specialty trainings below.  Anesthesiologists - trained in clinical pharmacology and interventional techniques such as nerve blockade and regional as well as central neuroanatomy. They are trained to block pain before, during, and after surgical interventions.  Neurologists - trained in the diagnosis and pharmacological treatment of complex neurological conditions, such as Multiple Sclerosis, Parkinson's, spinal cord injuries, and other systemic conditions that may be associated with symptoms that may include but are not limited to pain. They tend to rely primarily on the treatment of chronic pain using prescription medications.  Psychiatrist - trained in conditions affecting the psychosocial  wellbeing of patients including but not limited to depression, anxiety, schizophrenia, personality disorders, addiction, and other substance use disorders that may be associated with chronic pain. They tend to rely primarily on the treatment of chronic pain using prescription medications.   Physical Medicine and Rehabilitation (PMR) physicians, also known as physiatrists - trained to treat a wide variety of medical conditions affecting the brain, spinal cord, nerves, bones, joints, ligaments, muscles, and tendons. Their training is primarily aimed at treating patients that have suffered injuries that have caused severe physical impairment. Their training is primarily aimed at the physical therapy and rehabilitation of those patients. They may also work alongside orthopedic surgeons or neurosurgeons using their expertise in assisting surgical patients to recover after their surgeries.  INTERVENTIONAL PAIN MANAGEMENT is sub-subspecialty of Pain Management.  Our physicians are Board-certified in Anesthesia, Pain Management, and Interventional Pain Management.  This meaning that not only have they been trained and Board-certified in their specialty of Anesthesia, and subspecialty of Pain Management, but they have also received further training in the sub-subspecialty of Interventional Pain Management, in order to become Board-certified as INTERVENTIONAL PAIN MANAGEMENT SPECIALIST.    Mission: Our goal is to use our skills in  INTERVENTIONAL PAIN MANAGEMENT as alternatives to the chronic use of prescription opioid medications for the treatment of pain. To make this more clear, we have changed our name to reflect what we do and offer. We will continue to offer medication management assessment and recommendations, but we will not be taking over any patient's medication management.  ____________________________________________________________________________________________     

## 2022-05-16 NOTE — Progress Notes (Addendum)
Nursing Pain Medication Assessment:  Safety precautions to be maintained throughout the outpatient stay will include: orient to surroundings, keep bed in low position, maintain call bell within reach at all times, provide assistance with transfer out of bed and ambulation.  Medication Inspection Compliance: Pill count conducted under aseptic conditions, in front of the patient. Neither the pills nor the bottle was removed from the patient's sight at any time. Once count was completed pills were immediately returned to the patient in their original bottle.  Medication: Oxycodone/APAP Pill/Patch Count:  85 of 90 pills remain Pill/Patch Appearance: Markings consistent with prescribed medication Bottle Appearance: Standard pharmacy container. Clearly labeled. Filled Date: 26 / 28 / 2023 Last Medication intake:  TodaySafety precautions to be maintained throughout the outpatient stay will include: orient to surroundings, keep bed in low position, maintain call bell within reach at all times, provide assistance with transfer out of bed and ambulation.    Called [facility] to cancel prescription for remaining oxycodone/apap per DR. Naveira orders. Walked with pt across the street to [facility]. Dropped her left over percocet in the green drop box per protocol.

## 2022-05-30 ENCOUNTER — Other Ambulatory Visit: Payer: Self-pay

## 2022-05-30 MED ORDER — OZEMPIC (0.25 OR 0.5 MG/DOSE) 2 MG/3ML ~~LOC~~ SOPN: 0.5000 mg | PEN_INJECTOR | SUBCUTANEOUS | 0 refills | Status: DC

## 2022-06-01 DIAGNOSIS — E1142 Type 2 diabetes mellitus with diabetic polyneuropathy: Secondary | ICD-10-CM | POA: Diagnosis not present

## 2022-06-01 DIAGNOSIS — L84 Corns and callosities: Secondary | ICD-10-CM | POA: Diagnosis not present

## 2022-06-01 DIAGNOSIS — B351 Tinea unguium: Secondary | ICD-10-CM | POA: Diagnosis not present

## 2022-06-02 ENCOUNTER — Other Ambulatory Visit: Payer: Self-pay | Admitting: Internal Medicine

## 2022-06-04 NOTE — Telephone Encounter (Signed)
Requested Prescriptions  Pending Prescriptions Disp Refills   ezetimibe (ZETIA) 10 MG tablet [Pharmacy Med Name: EZETIMIBE 10 MG TABLET] 90 tablet 0    Sig: TAKE 1 TABLET BY MOUTH EVERY DAY     Cardiovascular:  Antilipid - Sterol Transport Inhibitors Failed - 06/02/2022  1:42 AM      Failed - Lipid Panel in normal range within the last 12 months    Cholesterol  Date Value Ref Range Status  01/23/2022 98 <200 mg/dL Final   LDL Cholesterol (Calc)  Date Value Ref Range Status  01/23/2022 36 mg/dL (calc) Final    Comment:    Reference range: <100 . Desirable range <100 mg/dL for primary prevention;   <70 mg/dL for patients with CHD or diabetic patients  with > or = 2 CHD risk factors. Marland Kitchen LDL-C is now calculated using the Martin-Hopkins  calculation, which is a validated novel method providing  better accuracy than the Friedewald equation in the  estimation of LDL-C.  Cresenciano Genre et al. Annamaria Helling. 1694;503(88): 2061-2068  (http://education.[facility].com/faq/FAQ164)    Direct LDL  Date Value Ref Range Status  11/04/2017 63.0 mg/dL Final    Comment:    Optimal:  <100 mg/dLNear or Above Optimal:  100-129 mg/dLBorderline High:  130-159 mg/dLHigh:  160-189 mg/dLVery High:  >190 mg/dL   HDL  Date Value Ref Range Status  01/23/2022 39 (L) > OR = 50 mg/dL Final   Triglycerides  Date Value Ref Range Status  01/23/2022 155 (H) <150 mg/dL Final         Passed - AST in normal range and within 360 days    AST  Date Value Ref Range Status  01/23/2022 27 10 - 35 U/L Final         Passed - ALT in normal range and within 360 days    ALT  Date Value Ref Range Status  01/23/2022 29 6 - 29 U/L Final         Passed - Patient is not pregnant      Passed - Valid encounter within last 12 months    Recent Outpatient Visits           4 months ago Screening for colon cancer   [facility] Jearld Fenton, NP   5 months ago Opioid-induced constipation (OIC)   [facility], Coralie Keens, NP   8 months ago Preop general physical exam   [place], [place]   9 months ago [facility] [place], [place], [place]   11 months ago Type 2 diabetes mellitus with diabetic mononeuropathy, without long-term current use of insulin [facility])   [facility] [place], Coralie Keens, NP

## 2022-06-05 ENCOUNTER — Telehealth: Payer: Self-pay | Admitting: Primary Care

## 2022-06-05 NOTE — Telephone Encounter (Signed)
Patient last seen 03/2020. She is aware that an appt is needed prior to order being place. Appt scheduled 06/18/2022. Nothing further needed.

## 2022-06-11 ENCOUNTER — Encounter: Payer: Self-pay | Admitting: Internal Medicine

## 2022-06-11 ENCOUNTER — Ambulatory Visit (INDEPENDENT_AMBULATORY_CARE_PROVIDER_SITE_OTHER): Payer: Medicare Other | Admitting: Internal Medicine

## 2022-06-11 VITALS — BP 112/74 | HR 83 | Temp 96.8°F | Wt 208.0 lb

## 2022-06-11 DIAGNOSIS — M47814 Spondylosis without myelopathy or radiculopathy, thoracic region: Secondary | ICD-10-CM | POA: Diagnosis not present

## 2022-06-11 DIAGNOSIS — N1831 Chronic kidney disease, stage 3a: Secondary | ICD-10-CM

## 2022-06-11 DIAGNOSIS — M797 Fibromyalgia: Secondary | ICD-10-CM

## 2022-06-11 DIAGNOSIS — M170 Bilateral primary osteoarthritis of knee: Secondary | ICD-10-CM

## 2022-06-11 DIAGNOSIS — G894 Chronic pain syndrome: Secondary | ICD-10-CM

## 2022-06-11 DIAGNOSIS — I951 Orthostatic hypotension: Secondary | ICD-10-CM

## 2022-06-11 DIAGNOSIS — E7800 Pure hypercholesterolemia, unspecified: Secondary | ICD-10-CM

## 2022-06-11 DIAGNOSIS — M5441 Lumbago with sciatica, right side: Secondary | ICD-10-CM | POA: Diagnosis not present

## 2022-06-11 DIAGNOSIS — M47812 Spondylosis without myelopathy or radiculopathy, cervical region: Secondary | ICD-10-CM | POA: Diagnosis not present

## 2022-06-11 DIAGNOSIS — I6523 Occlusion and stenosis of bilateral carotid arteries: Secondary | ICD-10-CM

## 2022-06-11 DIAGNOSIS — E6609 Other obesity due to excess calories: Secondary | ICD-10-CM

## 2022-06-11 DIAGNOSIS — I2510 Atherosclerotic heart disease of native coronary artery without angina pectoris: Secondary | ICD-10-CM

## 2022-06-11 DIAGNOSIS — E66811 Obesity, class 1: Secondary | ICD-10-CM

## 2022-06-11 DIAGNOSIS — F418 Other specified anxiety disorders: Secondary | ICD-10-CM

## 2022-06-11 DIAGNOSIS — G4733 Obstructive sleep apnea (adult) (pediatric): Secondary | ICD-10-CM

## 2022-06-11 DIAGNOSIS — E1141 Type 2 diabetes mellitus with diabetic mononeuropathy: Secondary | ICD-10-CM

## 2022-06-11 DIAGNOSIS — M160 Bilateral primary osteoarthritis of hip: Secondary | ICD-10-CM

## 2022-06-11 DIAGNOSIS — Z6830 Body mass index (BMI) 30.0-30.9, adult: Secondary | ICD-10-CM

## 2022-06-11 DIAGNOSIS — G8929 Other chronic pain: Secondary | ICD-10-CM

## 2022-06-11 LAB — POCT GLYCOSYLATED HEMOGLOBIN (HGB A1C): Hemoglobin A1C: 5.6 % (ref 4.0–5.6)

## 2022-06-11 NOTE — Assessment & Plan Note (Signed)
Managed with tizanidine as needed

## 2022-06-11 NOTE — Assessment & Plan Note (Signed)
C-Met and lipid profile today Encouraged her to consume low-fat diet Continue rosuvastatin, ezetimibe and aspirin 

## 2022-06-11 NOTE — Assessment & Plan Note (Signed)
Encourage diet and exercise for weight loss 

## 2022-06-11 NOTE — Assessment & Plan Note (Signed)
Encourage weight loss as this can help reduce sleep apnea symptoms Continue CPAP 

## 2022-06-11 NOTE — Assessment & Plan Note (Signed)
Continue paroxetine and bupropion Support offered

## 2022-06-11 NOTE — Assessment & Plan Note (Signed)
C-Met and lipid profile today Encouraged her to consume a low-fat diet Continue rosuvastatin, ezetimibe and aspirin

## 2022-06-11 NOTE — Assessment & Plan Note (Signed)
C-Met and lipid profile today Encouraged her to consume a low-fat diet Continue rosuvastatin and ezetimibe

## 2022-06-11 NOTE — Assessment & Plan Note (Signed)
C-Met today 

## 2022-06-11 NOTE — Assessment & Plan Note (Signed)
Continue fludrocortisone and midodrine Encourage adequate water intake

## 2022-06-11 NOTE — Assessment & Plan Note (Signed)
POCT A1c Urine microalbumin has been checked within the last year Encourage low-carb diet and exercise for weight loss Continue Ozempic and pregabalin Will request copy of eye exam Encourage routine exam Flu and Pneumovax UTD Encouraged her get her COVID booster

## 2022-06-11 NOTE — Patient Instructions (Signed)
Heart-Healthy Eating Plan Eating a healthy diet is important for the health of your heart. A heart-healthy eating plan includes: Eating less unhealthy fats. Eating more healthy fats. Eating less salt in your food. Salt is also called sodium. Making other changes in your diet. Talk with your doctor or a diet specialist (dietitian) to create an eating plan that is right for you. What is my plan? Your doctor may recommend an eating plan that includes: Total fat: ______% or less of total calories a day. Saturated fat: ______% or less of total calories a day. Cholesterol: less than _________mg a day. Sodium: less than _________mg a day. What are tips for following this plan? Cooking Avoid frying your food. Try to bake, boil, grill, or broil it instead. You can also reduce fat by: Removing the skin from poultry. Removing all visible fats from meats. Steaming vegetables in water or broth. Meal planning  At meals, divide your plate into four equal parts: Fill one-half of your plate with vegetables and green salads. Fill one-fourth of your plate with whole grains. Fill one-fourth of your plate with lean protein foods. Eat 2-4 cups of vegetables per day. One cup of vegetables is: 1 cup (91 g) broccoli or cauliflower florets. 2 medium carrots. 1 large bell pepper. 1 large sweet potato. 1 large tomato. 1 medium white potato. 2 cups (150 g) raw leafy greens. Eat 1-2 cups of fruit per day. One cup of fruit is: 1 small apple 1 large banana 1 cup (237 g) mixed fruit, 1 large orange,  cup (82 g) dried fruit, 1 cup (240 mL) 100% fruit juice. Eat more foods that have soluble fiber. These are apples, broccoli, carrots, beans, peas, and barley. Try to get 20-30 g of fiber per day. Eat 4-5 servings of nuts, legumes, and seeds per week: 1 serving of dried beans or legumes equals  cup (90 g) cooked. 1 serving of nuts is  oz (12 almonds, 24 pistachios, or 7 walnut halves). 1 serving of seeds  equals  oz (8 g). General information Eat more home-cooked food. Eat less restaurant, buffet, and fast food. Limit or avoid alcohol. Limit foods that are high in starch and sugar. Avoid fried foods. Lose weight if you are overweight. Keep track of how much salt (sodium) you eat. This is important if you have high blood pressure. Ask your doctor to tell you more about this. Try to add vegetarian meals each week. Fats Choose healthy fats. These include olive oil and canola oil, flaxseeds, walnuts, almonds, and seeds. Eat more omega-3 fats. These include salmon, mackerel, sardines, tuna, flaxseed oil, and ground flaxseeds. Try to eat fish at least 2 times each week. Check food labels. Avoid foods with trans fats or high amounts of saturated fat. Limit saturated fats. These are often found in animal products, such as meats, butter, and cream. These are also found in plant foods, such as palm oil, palm kernel oil, and coconut oil. Avoid foods with partially hydrogenated oils in them. These have trans fats. Examples are stick margarine, some tub margarines, cookies, crackers, and other baked goods. What foods should I eat? Fruits All fresh, canned (in natural juice), or frozen fruits. Vegetables Fresh or frozen vegetables (raw, steamed, roasted, or grilled). Green salads. Grains Most grains. Choose whole wheat and whole grains most of the time. Rice and pasta, including brown rice and pastas made with whole wheat. Meats and other proteins Lean, well-trimmed beef, veal, pork, and lamb. Chicken and turkey without skin.   All fish and shellfish. Wild duck, rabbit, pheasant, and venison. Egg whites or low-cholesterol egg substitutes. Dried beans, peas, lentils, and tofu. Seeds and most nuts. Dairy Low-fat or nonfat cheeses, including ricotta and mozzarella. Skim or 1% milk that is liquid, powdered, or evaporated. Buttermilk that is made with low-fat milk. Nonfat or low-fat yogurt. Fats and  oils Non-hydrogenated (trans-free) margarines. Vegetable oils, including soybean, sesame, sunflower, olive, peanut, safflower, corn, canola, and cottonseed. Salad dressings or mayonnaise made with a vegetable oil. Beverages Mineral water. Coffee and tea. Diet carbonated beverages. Sweets and desserts Sherbet, gelatin, and fruit ice. Small amounts of dark chocolate. Limit all sweets and desserts. Seasonings and condiments All seasonings and condiments. The items listed above may not be a complete list of foods and drinks you can eat. Contact a dietitian for more options. What foods should I avoid? Fruits Canned fruit in heavy syrup. Fruit in cream or butter sauce. Fried fruit. Limit coconut. Vegetables Vegetables cooked in cheese, cream, or butter sauce. Fried vegetables. Grains Breads that are made with saturated or trans fats, oils, or whole milk. Croissants. Sweet rolls. Donuts. High-fat crackers, such as cheese crackers. Meats and other proteins Fatty meats, such as hot dogs, ribs, sausage, bacon, rib-eye roast or steak. High-fat deli meats, such as salami and bologna. Caviar. Domestic duck and goose. Organ meats, such as liver. Dairy Cream, sour cream, cream cheese, and creamed cottage cheese. Whole-milk cheeses. Whole or 2% milk that is liquid, evaporated, or condensed. Whole buttermilk. Cream sauce or high-fat cheese sauce. Yogurt that is made from whole milk. Fats and oils Meat fat, or shortening. Cocoa butter, hydrogenated oils, palm oil, coconut oil, palm kernel oil. Solid fats and shortenings, including bacon fat, salt pork, lard, and butter. Nondairy cream substitutes. Salad dressings with cheese or sour cream. Beverages Regular sodas and juice drinks with added sugar. Sweets and desserts Frosting. Pudding. Cookies. Cakes. Pies. Milk chocolate or white chocolate. Buttered syrups. Full-fat ice cream or ice cream drinks. The items listed above may not be a complete list of foods  and drinks to avoid. Contact a dietitian for more information. Summary Heart-healthy meal planning includes eating less unhealthy fats, eating more healthy fats, and making other changes in your diet. Eat a balanced diet. This includes fruits and vegetables, low-fat or nonfat dairy, lean protein, nuts and legumes, whole grains, and heart-healthy oils and fats. This information is not intended to replace advice given to you by your health care provider. Make sure you discuss any questions you have with your health care provider. Document Revised: 07/24/2021 Document Reviewed: 07/24/2021 Elsevier Patient Education  2023 Elsevier Inc.  

## 2022-06-11 NOTE — Progress Notes (Signed)
Subjective:    Patient ID: Melinda [name], female    DOB: 1955/07/10, 66 y.o.   MRN: [id number]  HPI  Patient presents to clinic today for 39-monthfollow-up of chronic conditions.  OSA: She averages 4-12 hours of sleep per night with the use of her CPAP.  Sleep study from 05/2019 reviewed.  DM2 with Peripheral Neuropathy: Her last A1c was 5.7, 12/2021.  She does not check her sugars routinely.  She is taking Ozempic and Pregabalin as prescribed.  Her last eye exam was within the last year.  She checks her feet routinely.  Flu 04/2022.  Pneumovax 06/2021.  [place] 5.  CKD 3: Her last creatinine was 1.13, GFR 54, 12/2021.  She is not currently on an ACEI/ARB.  She does not follow with nephrology.  HLD with CAD, Aortic Atherosclerosis: Her last LDL was 36, triglycerides 155.  She denies myalgias on Rosuvastatin and Ezetimibe.  She is taking Aspirin as well.  She tries to consume a low-fat diet.  Chronic Pain/Fibromyalgia:  She is taking Tizanidine as prescribed. She no longer follows with pain management.  Depression: Chronic, managed with Paroxetine and Bupropion.  She is not currently seeing a therapist.  She denies SI/HI.  Orthostasis: Managed with Midodrine and Fludrocortisone. She follows with cardiology.   Review of Systems     Past Medical History:  Diagnosis Date   Abdominal wall seroma    [place] of [place], right, [place]   [place] of [place], right, [place]   Anginal pain (HCC)    Anxiety    Aortic atherosclerosis ([facility]    Bilateral carotid artery stenosis 12/24/2014   a.) Doppler 12/24/2014: 60-79% BILATERAL ICA. b.) Doppler 06/28/2015: 40-59% BILATERAL ICA. c.) Dopplers 04/23/2019, 04/22/2020, 10/20/2021: 455-73%RICA and 12-20%LICA.   Blue toes    2nd toe on right foot, will get appt.   Bulging disc    CAD (coronary artery disease) 2009   a.) PCI with placement of a 15 x 30 mm Promus DES to mLAD. b.) LHC 02/27/2011:  EF 60%; no sig CAD; mild lum irreg at ostium of diagnoal (jailed by stent).   Chicken pox    Chronic fatigue    Chronic, continuous use of opioids    CKD (chronic kidney disease), stage III ([place]    COVID 02/22/2022   Degenerative disc disease    Depression    Diverticulosis    Facet joint disease    Failed cervical fusion    Fibromyalgia    Hiatal hernia    Hyperlipidemia    IBS (irritable bowel syndrome)    MCL deficiency, knee    MRSA (methicillin resistant staph aureus) culture positive 2011   GREAT TOE RIGHT FOOT   Neuropathy 04/18/2010   Orthostatic hypotension 01/2020   a.) multiple pre-syncopal episodes; takes proamatine   OSA on CPAP 08/17/2003   Osteoarthritis    a.) back, neck, hands, knees   Restless leg syndrome    Sepsis [place] 09/[place]   Spinal headache 11/07/2021   Spinal stenosis    T2DM (type 2 diabetes mellitus) ([place]    Wolff-Parkinson-White (WPW) syndrome 1992   a.) s/p ablations; unsuccessful. Now has rare episodes    Current Outpatient Medications  Medication Sig Dispense Refill   ACCU-CHEK GUIDE test strip USE 1 EACH BY OTHER ROUTE 3 (THREE) TIMES DAILY. USE AS INSTRUCTE 200 strip 1   Accu-Chek Softclix Lancets lancets USE TO CHECK BLOOD SUGAR 1 TIME DAILY 100  each 2   albuterol (VENTOLIN HFA) 108 (90 Base) MCG/ACT inhaler Inhale 2 puffs into the lungs every 4 (four) hours as needed for wheezing or shortness of breath. 1 each 0   Ascorbic Acid (VITAMIN C) 1000 MG tablet Take 1,000 mg by mouth daily.     ASPIRIN 81 PO Take 81 mg by mouth daily.     Biotin 10000 MCG TABS Take 10,000 mcg by mouth daily.     Blood Glucose Calibration (ACCU-CHEK GUIDE CONTROL) LIQD 1 each by In Vitro route as needed. 1 each 1   buPROPion (WELLBUTRIN XL) 150 MG 24 hr tablet TAKE 1 TABLET BY MOUTH EVERY DAY 90 tablet 0   CALCIUM-MAGNESIUM-ZINC PO Take 3 capsules by mouth daily.     CINNAMON PO Take 1,000 mg by mouth daily.     Coenzyme Q10 (CO Q 10 PO) Take 1 tablet by  mouth daily.     ezetimibe (ZETIA) 10 MG tablet TAKE 1 TABLET BY MOUTH EVERY DAY 90 tablet 0   fludrocortisone (FLORINEF) 0.1 MG tablet Take 1 tablet (0.1 mg total) by mouth daily. 90 tablet 3   glucosamine-chondroitin 500-400 MG tablet Take 2 tablets by mouth daily.      Insulin Pen Needle 31G X 5 MM MISC BD Pen Needles- brand specific Inject insulin via insulin pen 6 x daily 100 each 2   Magnesium 400 MG CAPS Take 400 mg by mouth at bedtime.     meloxicam (MOBIC) 15 MG tablet Take 1 tablet (15 mg total) by mouth daily. 30 tablet 0   midodrine (PROAMATINE) 5 MG tablet Take 1 tablet (5 mg total) by mouth 3 (three) times daily with meals. 270 tablet 3   nitroGLYCERIN (NITROSTAT) 0.4 MG SL tablet PLACE 1 TABLET UNDER THE TONGUE EVERY 5 (FIVE) MINUTES AS NEEDED FOR CHEST PAIN. 25 tablet 4   Omega-3 Fatty Acids (FISH OIL) 1200 MG CAPS Take 1,200 mg by mouth in the morning.      oxyCODONE-acetaminophen (PERCOCET) 5-325 MG tablet Take 1 tablet by mouth every 8 (eight) hours as needed for severe pain. Must last 30 days. 90 tablet 0   OZEMPIC, 0.25 OR 0.5 MG/DOSE, 2 MG/3ML SOPN Inject 0.5 mg into the skin once a week. Please schedule an office visit before anymore refills. 9 mL 0   PARoxetine (PAXIL) 20 MG tablet TAKE 1/2 TABLET BY MOUTH DAILY IN THE MORNING 45 tablet 1   polyethylene glycol (MIRALAX / GLYCOLAX) packet Take 17 g by mouth daily.     pregabalin (LYRICA) 150 MG capsule TAKE 1 CAPSULE BY MOUTH THREE TIMES A DAY 270 capsule 0   rosuvastatin (CRESTOR) 20 MG tablet TAKE 1 TABLET BY MOUTH EVERY DAY 90 tablet 3   TART CHERRY PO Take 1 tablet by mouth daily at 6 (six) AM.     tiZANidine (ZANAFLEX) 4 MG tablet TAKE 1 TABLET (4 MG TOTAL) BY MOUTH EVERY 8 (EIGHT) HOURS AS NEEDED FOR MUSCLE SPASMS 270 tablet 0   vitamin E 180 MG (400 UNITS) capsule Take 400 Units by mouth daily.     No current facility-administered medications for this visit.    No Known Allergies  Family History  Problem  Relation Age of Onset   Cancer Mother    Lung cancer Mother    Arthritis Father    Heart disease Father    Diabetes Father    Dementia Maternal Grandmother    Alcohol abuse Paternal Grandfather    Heart disease Paternal  Grandfather    Stroke Paternal Grandfather     Social History   Socioeconomic History   Marital status: Significant Other    Spouse name: Not on file   Number of children: Not on file   Years of education: Not on file   Highest education level: Not on file  Occupational History   Not on file  Tobacco Use   Smoking status: Former    Packs/day: 1.50    Years: 32.00    Total pack years: 48.00    Types: Cigarettes    Quit date: 07/22/2007    Years since quitting: 14.8   Smokeless tobacco: Never  Vaping Use   Vaping Use: Never used  Substance and Sexual Activity   Alcohol use: Yes    Comment: occ - Holidays   Drug use: Never    Comment: prescribed pain pump and oxy   Sexual activity: Yes  Other Topics Concern   Not on file  Social History Narrative   Lives at home with a partner. Independent at baseline   Social Determinants of Health   Financial Resource Strain: Low Risk  (10/05/2021)   Overall Financial Resource Strain (CARDIA)    Difficulty of Paying Living Expenses: Not hard at all  Food Insecurity: No Food Insecurity (10/05/2021)   Hunger Vital Sign    Worried About Running Out of Food in the Last Year: Never true    Ran Out of Food in the Last Year: Never true  Transportation Needs: No Transportation Needs (10/05/2021)   PRAPARE - Hydrologist (Medical): No    Lack of Transportation (Non-Medical): No  Physical Activity: Inactive (10/05/2021)   Exercise Vital Sign    Days of Exercise per Week: 0 days    Minutes of Exercise per Session: 0 min  Stress: Not on file  Social Connections: Moderately Isolated (10/05/2021)   Social Connection and Isolation Panel [NHANES]    Frequency of Communication with Friends and Family:  More than three times a week    Frequency of Social Gatherings with Friends and Family: Never    Attends Religious Services: Never    Marine scientist or Organizations: No    Attends Music therapist: Never    Marital Status: Living with partner  Intimate Partner Violence: Not on file     Constitutional: Denies fever, malaise, fatigue, headache or abrupt weight changes.  HEENT: Denies eye pain, eye redness, ear pain, ringing in the ears, wax buildup, runny nose, nasal congestion, bloody nose, or sore throat. Respiratory: Denies difficulty breathing, shortness of breath, cough or sputum production.   Cardiovascular: Denies chest pain, chest tightness, palpitations or swelling in the hands or feet.  Gastrointestinal: Denies abdominal pain, bloating, constipation, diarrhea or blood in the stool.  GU: Denies urgency, frequency, pain with urination, burning sensation, blood in urine, odor or discharge. Musculoskeletal: Pt reports chronic joint and muscle pain. Denies decrease in range of motion, difficulty with gait, muscle or joint swelling.  Skin: Denies redness, rashes, lesions or ulcercations.  Neurological: Pt reports neuropathic pain. Denies dizziness, difficulty with memory, difficulty with speech or problems with balance and coordination.  Psych: Denies anxiety, depression, SI/HI.  No other specific complaints in a complete review of systems (except as listed in HPI above).  Objective:   Physical Exam  BP 112/74 (BP Location: Left Arm, Patient Position: Sitting, Cuff Size: Normal)   Pulse 83   Temp (!) 96.8 F (36 C) (Temporal)  Wt 208 lb (94.3 kg)   LMP  (LMP Unknown)   SpO2 98%   BMI 30.72 kg/m  \ Wt Readings from Last 3 Encounters:  05/16/22 205 lb (93 kg)  05/02/22 216 lb (98 kg)  04/17/22 206 lb (93.4 kg)    General: Appears her stated age, obese, in NAD. Skin: Warm, dry and intact. No ulcerations noted. HEENT: Head: normal shape and size; Eyes:  sclera white, no icterus, conjunctiva pink, PERRLA and EOMs intact;  Cardiovascular: Normal rate and rhythm. S1,S2 noted.  No murmur, rubs or gallops noted. No JVD or BLE edema. No carotid bruits noted. Pulmonary/Chest: Normal effort and positive vesicular breath sounds. No respiratory distress. No wheezes, rales or ronchi noted.  Musculoskeletal:  No difficulty with gait.  Neurological: Alert and oriented. Coordination normal.  Psychiatric: Mood and affect normal. Behavior is normal. Judgment and thought content normal.   BMET    Component Value Date/Time   NA 143 01/23/2022 1433   NA 139 10/23/2021 1552   NA 135 (L) 06/30/2013 1203   K 4.7 01/23/2022 1433   K 4.1 06/30/2013 1203   CL 105 01/23/2022 1433   CL 102 06/30/2013 1203   CO2 31 01/23/2022 1433   CO2 32 06/30/2013 1203   GLUCOSE 92 01/23/2022 1433   GLUCOSE 143 (H) 06/30/2013 1203   BUN 27 (H) 01/23/2022 1433   BUN 21 10/23/2021 1552   BUN 19 (H) 06/30/2013 1203   CREATININE 1.13 (H) 01/23/2022 1433   CALCIUM 9.2 01/23/2022 1433   CALCIUM 9.7 06/30/2013 1203   GFRNONAA 56 (L) 12/21/2020 0957   GFRAA 65 12/21/2020 0957    Lipid Panel     Component Value Date/Time   CHOL 98 01/23/2022 1433   TRIG 155 (H) 01/23/2022 1433   HDL 39 (L) 01/23/2022 1433   CHOLHDL 2.5 01/23/2022 1433   VLDL 31.6 03/12/2019 1255   LDLCALC 36 01/23/2022 1433    CBC    Component Value Date/Time   WBC 5.5 01/23/2022 1433   RBC 4.47 01/23/2022 1433   HGB 12.9 01/23/2022 1433   HGB 14.0 11/12/2011 1136   HCT 38.6 01/23/2022 1433   HCT 40.7 11/12/2011 1136   PLT 202 01/23/2022 1433   PLT 274 11/12/2011 1136   MCV 86.4 01/23/2022 1433   MCV 86 11/12/2011 1136   MCH 28.9 01/23/2022 1433   MCHC 33.4 01/23/2022 1433   RDW 12.5 01/23/2022 1433   RDW 12.8 11/12/2011 1136   LYMPHSABS 1.4 09/06/2020 1330   LYMPHSABS 2.5 11/12/2011 1136   MONOABS 0.5 09/06/2020 1330   MONOABS 0.8 11/12/2011 1136   EOSABS 0.2 09/06/2020 1330   EOSABS  0.6 11/12/2011 1136   BASOSABS 0.1 09/06/2020 1330   BASOSABS 0.1 11/12/2011 1136    Hgb A1C Lab Results  Component Value Date   HGBA1C 5.7 (H) 01/23/2022           Assessment & Plan:      RTC in 7 months for your annual exam [name], NP

## 2022-06-11 NOTE — Assessment & Plan Note (Signed)
C-Met and lipid profile today Encouraged her to consume low-fat diet Continue rosuvastatin, ezetimibe and aspirin

## 2022-06-12 LAB — CBC
HCT: 41.8 % (ref 35.0–45.0)
Hemoglobin: 14.4 g/dL (ref 11.7–15.5)
MCH: 29.2 pg (ref 27.0–33.0)
MCHC: 34.4 g/dL (ref 32.0–36.0)
MCV: 84.8 fL (ref 80.0–100.0)
MPV: 9.1 fL (ref 7.5–12.5)
Platelets: 229 10*3/uL (ref 140–400)
RBC: 4.93 10*6/uL (ref 3.80–5.10)
RDW: 12.4 % (ref 11.0–15.0)
WBC: 6.3 10*3/uL (ref 3.8–10.8)

## 2022-06-12 LAB — COMPLETE METABOLIC PANEL WITH GFR
AG Ratio: 1.7 (calc) (ref 1.0–2.5)
ALT: 22 U/L (ref 6–29)
AST: 28 U/L (ref 10–35)
Albumin: 4.8 g/dL (ref 3.6–5.1)
Alkaline phosphatase (APISO): 110 U/L (ref 37–153)
BUN/Creatinine Ratio: 15 (calc) (ref 6–22)
BUN: 21 mg/dL (ref 7–25)
CO2: 31 mmol/L (ref 20–32)
Calcium: 10 mg/dL (ref 8.6–10.4)
Chloride: 103 mmol/L (ref 98–110)
Creat: 1.36 mg/dL — ABNORMAL HIGH (ref 0.50–1.05)
Globulin: 2.8 g/dL (calc) (ref 1.9–3.7)
Glucose, Bld: 72 mg/dL (ref 65–99)
Potassium: 4.7 mmol/L (ref 3.5–5.3)
Sodium: 143 mmol/L (ref 135–146)
Total Bilirubin: 0.6 mg/dL (ref 0.2–1.2)
Total Protein: 7.6 g/dL (ref 6.1–8.1)
eGFR: 43 mL/min/{1.73_m2} — ABNORMAL LOW (ref >=60)

## 2022-06-12 LAB — LIPID PANEL
Cholesterol: 141 mg/dL (ref <200)
HDL: 51 mg/dL (ref >=50)
LDL Cholesterol (Calc): 67 mg/dL (calc)
Non-HDL Cholesterol (Calc): 90 mg/dL (calc) (ref <130)
Total CHOL/HDL Ratio: 2.8 (calc) (ref <5.0)
Triglycerides: 146 mg/dL (ref <150)

## 2022-06-18 ENCOUNTER — Ambulatory Visit (INDEPENDENT_AMBULATORY_CARE_PROVIDER_SITE_OTHER): Payer: Medicare Other | Admitting: Primary Care

## 2022-06-18 ENCOUNTER — Encounter: Payer: Self-pay | Admitting: Primary Care

## 2022-06-18 VITALS — BP 138/74 | HR 71 | Temp 98.6°F | Ht 69.0 in | Wt 211.2 lb

## 2022-06-18 DIAGNOSIS — J300 Vasomotor rhinitis: Secondary | ICD-10-CM | POA: Diagnosis not present

## 2022-06-18 DIAGNOSIS — G4733 Obstructive sleep apnea (adult) (pediatric): Secondary | ICD-10-CM | POA: Diagnosis not present

## 2022-06-18 NOTE — Progress Notes (Signed)
$'@Patient'x$  ID: Melinda [name], female    DOB: [birth year], 66 y.o.   MRN: [id number]  Chief Complaint  Patient presents with   Follow-up    Pt feels CPAP pressure is too high.  Causes her mouth to open slightly and air comes out.  Snoring much improved with CPAP.  Pt using CBD instead of pain meds.    Referring provider: Jearld Fenton, NP  HPI: 66 year old female, former smoker.  Past medical history significant for obstructive sleep apnea, daytime sleepiness. Former Dr. Ashby Dawes, last seen in November 2019. Maintained on auto CPAP 9-20cm h20. She has been on Provigil and Nuvigil in the past which have subsequently been weaned off. Chronic pain managed by pain clinic.   Previous [facility] pulmonary encounter:  02/03/2020 Patient presents today for follow-up. She has not been seen in over 2.5 years. She had her own CPAP machine which stopped working. She has been without machine for 2 weeks. Huey Romans is sending her a new CPAP. She wears full nasal mask. Normal bedtime varies. She generally wakes up between 8-8:30am. She will sometimes fall asleep in the chair. She does some pursed lip breathing at night, she has had a chin strap with her mask but this didn't particularly help. She sleeps well. She sleeps on her back. CPAP does help her sleep. She quit smoking.   03/25/2020 Patient contacted today for 6-8 week follow-up. Doing well, sleeping well and compliant with CPAP use. She uses nasal mask and has tried chin strap but still experiences airleak. States that regardless of what she does she still opens her mouth which wakes up her partner. She would like to try a full face mask. DME company is [employer].   Airview download 02/20/20-03/20/20: Usage 25/30 (83%)  Pressure 9-20cm h20 (12.2- 95%) Airleak 17.5L/min (95%) AHI 3.1  06/18/2022- Interim hx  Patient presents today for overdue follow-up OSA. CPAP pressure was lowered to 9-16cm h20 back in January 2022. She reports 100% compliance with CPAP use.  Snoring improved. She feels pressure is still too high. She has lost weight. Wife noticing more airleaks, she changed pressure herself to 6-12cm h20. She is no longer waking her significant other up. She is sleeping well. The only time she does not use CPAP is when she falls asleep in recliner. When this occurs she will still snore. She is no longer on pain medications for chronic pain, using CBD.   Airview download 05/19/22-06/17/22 30/30 days (100%); 27 days (90%) > 4 hours Pressure 6-12cm h20 (10.5cm h20-95%) Airleaks 16L/min (95%) AHI 2.6/hour   No Known Allergies  Immunization History  Administered Date(s) Administered   Influenza,inj,Quad PF,6+ Mos 07/28/2015, 05/18/2016, 08/06/2017, 02/13/2019, 02/20/2020, 04/06/2022   Influenza-Unspecified 04/15/1998, 05/19/2014, 04/10/2021   PFIZER(Purple Top)SARS-COV-2 Vaccination 09/19/2019, 10/13/2019, 07/03/2020, 12/21/2020   Pfizer Covid-19 Vaccine Bivalent Booster 67yr & up 04/10/2021   Pneumococcal Polysaccharide-23 07/28/2015, 06/22/2021   Rabies, IM 07/09/2019, 07/12/2019, 07/16/2019, 07/23/2019   Td 07/03/1991   Tdap 08/03/2007, 07/09/2019   Zoster Recombinat (Shingrix) 03/12/2019    Past Medical History:  Diagnosis Date   Abdominal wall seroma    Amputation of great toe, right, traumatic ([facility] 05/30/2010   Amputation of second toe, right, traumatic ([facility] 09/2017   Anginal pain (H[facility])    Anxiety    Aortic atherosclerosis ([facility]    Bilateral carotid artery stenosis 12/24/2014   a.) Doppler 12/24/2014: 60-79% BILATERAL ICA. b.) Doppler 06/28/2015: 40-59% BILATERAL ICA. c.) Dopplers 04/23/2019, 04/22/2020, 10/20/2021: 432-20%RICA and 12-54%LICA.   Blue  toes    2nd toe on right foot, will get appt.   Bulging disc    CAD (coronary artery disease) 2009   a.) PCI with placement of a 15 x 30 mm Promus DES to mLAD. b.) LHC 02/27/2011: EF 60%; no sig CAD; mild lum irreg at ostium of diagnoal (jailed by stent).   Chicken pox    Chronic  fatigue    Chronic, continuous use of opioids    CKD (chronic kidney disease), stage III ([place])    COVID 02/22/2022   Degenerative disc disease    Depression    Diverticulosis    Facet joint disease    Failed cervical fusion    Fibromyalgia    Hiatal hernia    Hyperlipidemia    IBS (irritable bowel syndrome)    MCL deficiency, knee    MRSA (methicillin resistant staph aureus) culture positive 2011   GREAT TOE RIGHT FOOT   Neuropathy 04/18/2010   Orthostatic hypotension 01/2020   a.) multiple pre-syncopal episodes; takes proamatine   OSA on CPAP 08/17/2003   Osteoarthritis    a.) back, neck, hands, knees   Restless leg syndrome    Sepsis ([place]) 09/2013   Spinal headache 11/07/2021   Spinal stenosis    T2DM (type 2 diabetes mellitus) ([place])    Wolff-Parkinson-White (WPW) syndrome 1992   a.) s/p ablations; unsuccessful. Now has rare episodes    Tobacco History: Social History   Tobacco Use  Smoking Status Former   Packs/day: 1.50   Years: 32.00   Total pack years: 48.00   Types: Cigarettes   Quit date: 07/22/2007   Years since quitting: 14.9  Smokeless Tobacco Not on file   Counseling given: Not Answered   Outpatient Medications Prior to Visit  Medication Sig Dispense Refill   ACCU-CHEK GUIDE test strip USE 1 EACH BY OTHER ROUTE 3 (THREE) TIMES DAILY. USE AS INSTRUCTE 200 strip 1   Accu-Chek Softclix Lancets lancets USE TO CHECK BLOOD SUGAR 1 TIME DAILY 100 each 2   albuterol (VENTOLIN HFA) 108 (90 Base) MCG/ACT inhaler Inhale 2 puffs into the lungs every 4 (four) hours as needed for wheezing or shortness of breath. 1 each 0   Ascorbic Acid (VITAMIN C) 1000 MG tablet Take 1,000 mg by mouth daily.     ASPIRIN 81 PO Take 81 mg by mouth daily.     Biotin 10000 MCG TABS Take 10,000 mcg by mouth daily.     Blood Glucose Calibration (ACCU-CHEK GUIDE CONTROL) LIQD 1 each by In Vitro route as needed. 1 each 1   buPROPion (WELLBUTRIN XL) 150 MG 24 hr tablet TAKE 1 TABLET BY  MOUTH EVERY DAY 90 tablet 0   CALCIUM-MAGNESIUM-ZINC PO Take 3 capsules by mouth daily.     CINNAMON PO Take 1,000 mg by mouth daily.     Coenzyme Q10 (CO Q 10 PO) Take 1 tablet by mouth daily.     ezetimibe (ZETIA) 10 MG tablet TAKE 1 TABLET BY MOUTH EVERY DAY 90 tablet 0   fludrocortisone (FLORINEF) 0.1 MG tablet Take 1 tablet (0.1 mg total) by mouth daily. 90 tablet 3   glucosamine-chondroitin 500-400 MG tablet Take 2 tablets by mouth daily.      Insulin Pen Needle 31G X 5 MM MISC BD Pen Needles- brand specific Inject insulin via insulin pen 6 x daily 100 each 2   Magnesium 400 MG CAPS Take 400 mg by mouth at bedtime.     midodrine (PROAMATINE) 5 MG  tablet Take 1 tablet (5 mg total) by mouth 3 (three) times daily with meals. 270 tablet 3   nitroGLYCERIN (NITROSTAT) 0.4 MG SL tablet PLACE 1 TABLET UNDER THE TONGUE EVERY 5 (FIVE) MINUTES AS NEEDED FOR CHEST PAIN. 25 tablet 4   Omega-3 Fatty Acids (FISH OIL) 1200 MG CAPS Take 1,200 mg by mouth in the morning.      OZEMPIC, 0.25 OR 0.5 MG/DOSE, 2 MG/3ML SOPN Inject 0.5 mg into the skin once a week. Please schedule an office visit before anymore refills. 9 mL 0   PARoxetine (PAXIL) 20 MG tablet TAKE 1/2 TABLET BY MOUTH DAILY IN THE MORNING 45 tablet 1   pregabalin (LYRICA) 150 MG capsule TAKE 1 CAPSULE BY MOUTH THREE TIMES A DAY 270 capsule 0   rosuvastatin (CRESTOR) 20 MG tablet TAKE 1 TABLET BY MOUTH EVERY DAY 90 tablet 3   tiZANidine (ZANAFLEX) 4 MG tablet TAKE 1 TABLET (4 MG TOTAL) BY MOUTH EVERY 8 (EIGHT) HOURS AS NEEDED FOR MUSCLE SPASMS 270 tablet 0   vitamin E 180 MG (400 UNITS) capsule Take 400 Units by mouth daily.     No facility-administered medications prior to visit.    Review of Systems  Review of Systems  Constitutional: Negative.   HENT:  Positive for postnasal drip.   Respiratory: Negative.      Physical Exam  BP 138/74 (BP Location: Left Arm, Patient Position: Sitting, Cuff Size: Large)   Pulse 71   Temp 98.6 F (37  C) (Oral)   Ht '5\' 9"'$  (1.753 m)   Wt 211 lb 3.2 oz (95.8 kg)   LMP  (LMP Unknown)   SpO2 98%   BMI 31.19 kg/m  Physical Exam Constitutional:      Appearance: Normal appearance.  HENT:     Head: Normocephalic and atraumatic.     Mouth/Throat:     Mouth: Mucous membranes are moist.     Pharynx: Oropharynx is clear.  Cardiovascular:     Rate and Rhythm: Normal rate and regular rhythm.  Pulmonary:     Effort: Pulmonary effort is normal.     Breath sounds: Normal breath sounds.  Musculoskeletal:        General: Normal range of motion.  Skin:    General: Skin is warm and dry.  Neurological:     General: No focal deficit present.     Mental Status: She is alert and oriented to person, place, and time. Mental status is at baseline.  Psychiatric:        Mood and Affect: Mood normal.        Behavior: Behavior normal.        Thought Content: Thought content normal.        Judgment: Judgment normal.      Lab Results:  CBC    Component Value Date/Time   WBC 6.3 06/11/2022 1431   RBC 4.93 06/11/2022 1431   HGB 14.4 06/11/2022 1431   HGB 14.0 11/12/2011 1136   HCT 41.8 06/11/2022 1431   HCT 40.7 11/12/2011 1136   PLT 229 06/11/2022 1431   PLT 274 11/12/2011 1136   MCV 84.8 06/11/2022 1431   MCV 86 11/12/2011 1136   MCH 29.2 06/11/2022 1431   MCHC 34.4 06/11/2022 1431   RDW 12.4 06/11/2022 1431   RDW 12.8 11/12/2011 1136   LYMPHSABS 1.4 09/06/2020 1330   LYMPHSABS 2.5 11/12/2011 1136   MONOABS 0.5 09/06/2020 1330   MONOABS 0.8 11/12/2011 1136   EOSABS 0.2 09/06/2020  1330   EOSABS 0.6 11/12/2011 1136   BASOSABS 0.1 09/06/2020 1330   BASOSABS 0.1 11/12/2011 1136    BMET    Component Value Date/Time   NA 143 06/11/2022 1431   NA 139 10/23/2021 1552   NA 135 (L) 06/30/2013 1203   K 4.7 06/11/2022 1431   K 4.1 06/30/2013 1203   CL 103 06/11/2022 1431   CL 102 06/30/2013 1203   CO2 31 06/11/2022 1431   CO2 32 06/30/2013 1203   GLUCOSE 72 06/11/2022 1431    GLUCOSE 143 (H) 06/30/2013 1203   BUN 21 06/11/2022 1431   BUN 21 10/23/2021 1552   BUN 19 (H) 06/30/2013 1203   CREATININE 1.36 (H) 06/11/2022 1431   CALCIUM 10.0 06/11/2022 1431   CALCIUM 9.7 06/30/2013 1203   GFRNONAA 56 (L) 12/21/2020 0957   GFRAA 65 12/21/2020 0957    BNP    Component Value Date/Time   BNP 35 04/03/2019 1522    ProBNP No results found for: "PROBNP"  Imaging: No results found.   Assessment & Plan:   Obstructive sleep apnea syndrome - Patient is 90% compliant with CPAP use over the last 30 days > 4 hours.  She has lost weight and wife started noticing air leaks. Patient lowered CPAP pressure.  Current pressure 6 -12 cm H2O; Residual AHI 2.6/hr.  Continue to encourage patient wear CPAP every night for minimum 4 to 6 hours or longer.  Maintain weight loss efforts. Advised against driving if experiencing excessive daytime sleepiness. Follow-up 12 months or sooner if needed.   Vasomotor rhinitis - Recommend patient try OTC antihistamine and short term use of afrin nasal spray prn congestion. She may benefit from adjusting humidification on CPAP as well.   Martyn Ehrich, NP 06/18/2022

## 2022-06-18 NOTE — Assessment & Plan Note (Signed)
-   Patient is 90% compliant with CPAP use over the last 30 days > 4 hours.  She has lost weight and wife started noticing air leaks. Patient lowered CPAP pressure.  Current pressure 6 -12 cm H2O; Residual AHI 2.6/hr.  Continue to encourage patient wear CPAP every night for minimum 4 to 6 hours or longer.  Maintain weight loss efforts. Advised against driving if experiencing excessive daytime sleepiness. Follow-up 12 months or sooner if needed.

## 2022-06-18 NOTE — Patient Instructions (Addendum)
Sleep apnea well-controlled on current CPAP pressure settings Apnea hypopnea score is 2.6 an hour (less than 5 is normal)  Recommendations: Try Afrin nasal spray as needed for post nasal drip (do not exceed more than 3 days in a row) Try over the counter antihistamine (Claritin or zyrtec) at bedtime  Try lowering humidification setting on CPAP   Follow-up: 12 months with [facility] NP or sooner if needed  CPAP and BIPAP Information CPAP and BIPAP are methods that use air pressure to keep your airways open and to help you breathe well. CPAP and BIPAP use different amounts of pressure. Your health care provider will tell you whether CPAP or BIPAP would be more helpful for you. CPAP stands for "continuous positive airway pressure." With CPAP, the amount of pressure stays the same while you breathe in (inhale) and out (exhale). BIPAP stands for "bi-level positive airway pressure." With BIPAP, the amount of pressure will be higher when you inhale and lower when you exhale. This allows you to take larger breaths. CPAP or BIPAP may be used in the hospital, or your health care provider may want you to use it at home. You may need to have a sleep study before your health care provider can order a machine for you to use at home. What are the advantages? CPAP or BIPAP can be helpful if you have: Sleep apnea. Chronic obstructive pulmonary disease (COPD). Heart failure. Medical conditions that cause muscle weakness, including muscular dystrophy or amyotrophic lateral sclerosis (ALS). Other problems that cause breathing to be shallow, weak, abnormal, or difficult. CPAP and BIPAP are most commonly used for obstructive sleep apnea (OSA) to keep the airways from collapsing when the muscles relax during sleep. What are the risks? Generally, this is a safe treatment. However, problems may occur, including: Irritated skin or skin sores if the mask does not fit properly. Dry or stuffy nose or nosebleeds. Dry  mouth. Feeling gassy or bloated. Sinus or lung infection if the equipment is not cleaned properly. When should CPAP or BIPAP be used? In most cases, the mask only needs to be worn during sleep. Generally, the mask needs to be worn throughout the night and during any daytime naps. People with certain medical conditions may also need to wear the mask at other times, such as when they are awake. Follow instructions from your health care provider about when to use the machine. What happens during CPAP or BIPAP?  Both CPAP and BIPAP are provided by a small machine with a flexible plastic tube that attaches to a plastic mask that you wear. Air is blown through the mask into your nose or mouth. The amount of pressure that is used to blow the air can be adjusted on the machine. Your health care provider will set the pressure setting and help you find the best mask for you. Tips for using the mask Because the mask needs to be snug, some people feel trapped or closed-in (claustrophobic) when first using the mask. If you feel this way, you may need to get used to the mask. One way to do this is to hold the mask loosely over your nose or mouth and then gradually apply the mask more snugly. You can also gradually increase the amount of time that you use the mask. Masks are available in various types and sizes. If your mask does not fit well, talk with your health care provider about getting a different one. Some common types of masks include: Full face masks,  which fit over the mouth and nose. Nasal masks, which fit over the nose. Nasal pillow or prong masks, which fit into the nostrils. If you are using a mask that fits over your nose and you tend to breathe through your mouth, a chin strap may be applied to help keep your mouth closed. Use a skin barrier to protect your skin as told by your health care provider. Some CPAP and BIPAP machines have alarms that may sound if the mask comes off or develops a  leak. If you have trouble with the mask, it is very important that you talk with your health care provider about finding a way to make the mask easier to tolerate. Do not stop using the mask. There could be a negative impact on your health if you stop using the mask. Tips for using the machine Place your CPAP or BIPAP machine on a secure table or stand near an electrical outlet. Know where the on/off switch is on the machine. Follow instructions from your health care provider about how to set the pressure on your machine and when you should use it. Do not eat or drink while the CPAP or BIPAP machine is on. Food or fluids could get pushed into your lungs by the pressure of the CPAP or BIPAP. For home use, CPAP and BIPAP machines can be rented or purchased through home health care companies. Many different brands of machines are available. Renting a machine before purchasing may help you find out which particular machine works well for you. Your health insurance company may also decide which machine you may get. Keep the CPAP or BIPAP machine and attachments clean. Ask your health care provider for specific instructions. Check the humidifier if you have a dry stuffy nose or nosebleeds. Make sure it is working correctly. Follow these instructions at home: Take over-the-counter and prescription medicines only as told by your health care provider. Ask if you can take sinus medicine if your sinuses are blocked. Do not use any products that contain nicotine or tobacco. These products include cigarettes, chewing tobacco, and vaping devices, such as e-cigarettes. If you need help quitting, ask your health care provider. Keep all follow-up visits. This is important. Contact a health care provider if: You have redness or pressure sores on your head, face, mouth, or nose from the mask or head gear. You have trouble using the CPAP or BIPAP machine. You cannot tolerate wearing the CPAP or BIPAP mask. Someone  tells you that you snore even when wearing your CPAP or BIPAP. Get help right away if: You have trouble breathing. You feel confused. Summary CPAP and BIPAP are methods that use air pressure to keep your airways open and to help you breathe well. If you have trouble with the mask, it is very important that you talk with your health care provider about finding a way to make the mask easier to tolerate. Do not stop using the mask. There could be a negative impact to your health if you stop using the mask. Follow instructions from your health care provider about when to use the machine. This information is not intended to replace advice given to you by your health care provider. Make sure you discuss any questions you have with your health care provider. Document Revised: 01/25/2021 Document Reviewed: 05/27/2020 Elsevier Patient Education  [place].

## 2022-06-18 NOTE — Assessment & Plan Note (Addendum)
-   Recommend patient try OTC antihistamine and short term use of afrin nasal spray prn congestion. She may benefit from adjusting humidification on CPAP as well.

## 2022-06-25 ENCOUNTER — Encounter: Payer: Self-pay | Admitting: Internal Medicine

## 2022-06-25 DIAGNOSIS — N1831 Chronic kidney disease, stage 3a: Secondary | ICD-10-CM

## 2022-06-26 ENCOUNTER — Other Ambulatory Visit: Payer: Self-pay | Admitting: Internal Medicine

## 2022-06-26 DIAGNOSIS — M797 Fibromyalgia: Secondary | ICD-10-CM

## 2022-06-26 NOTE — Telephone Encounter (Signed)
Requested medication (s) are due for refill today: yes   Requested medication (s) are on the active medication list: yes   Last refill:  03/22/22 #270  Future visit scheduled: yes   Notes to clinic:  [place] review for refill. Refill not delegated per protocol.     Requested Prescriptions  Pending Prescriptions Disp Refills   pregabalin (LYRICA) 150 MG capsule [Pharmacy Med Name: PREGABALIN 150 MG CAPSULE] 270 capsule 0    Sig: TAKE 1 CAPSULE BY MOUTH THREE TIMES A DAY     Not Delegated - Neurology:  Anticonvulsants - Controlled - pregabalin Failed - 06/26/2022  9:58 AM      Failed - This refill cannot be delegated      Failed - Cr in normal range and within 360 days    Creat  Date Value Ref Range Status  06/11/2022 1.36 (H) 0.50 - 1.05 mg/dL Final   Creatinine,U  Date Value Ref Range Status  03/12/2019 131.0 mg/dL Final   Creatinine, Urine  Date Value Ref Range Status  01/23/2022 69 20 - 275 mg/dL Final         Passed - Completed PHQ-2 or PHQ-9 in the last 360 days      Passed - Valid encounter within last 12 months    Recent Outpatient Visits           2 weeks ago Type 2 diabetes mellitus with diabetic mononeuropathy, without long-term current use of insulin ([place])   [place], Coralie Keens, NP   5 months ago Screening for colon cancer   [place], Coralie Keens, NP   5 months ago Opioid-induced constipation (OIC)   [facility], Coralie Keens, NP   9 months ago Preop general physical exam   [place], [place]   9 months ago [facility] [place], Coralie Keens, NP       Future Appointments             In 7 months Baity, Coralie Keens, NP [facility], [facility]

## 2022-07-09 MED ORDER — TIRZEPATIDE 2.5 MG/0.5ML ~~LOC~~ SOAJ: 2.5000 mg | SUBCUTANEOUS | 0 refills | Status: DC

## 2022-07-09 NOTE — Addendum Note (Signed)
Addended by: Jearld Fenton on: 07/09/2022 09:07 AM   Modules accepted: Orders

## 2022-07-13 DIAGNOSIS — G4733 Obstructive sleep apnea (adult) (pediatric): Secondary | ICD-10-CM | POA: Diagnosis not present

## 2022-07-25 ENCOUNTER — Other Ambulatory Visit: Payer: Self-pay | Admitting: Internal Medicine

## 2022-07-25 NOTE — Telephone Encounter (Signed)
Requested medication (s) are due for refill today: yes  Requested medication (s) are on the active medication list: yes  Last refill:  04/24/22 #90 0 refills  Future visit scheduled: yes in 6 months  Notes to clinic:  no refills remain. Do you want to refill for 6 months?     Requested Prescriptions  Pending Prescriptions Disp Refills   buPROPion (WELLBUTRIN XL) 150 MG 24 hr tablet [Pharmacy Med Name: BUPROPION HCL XL 150 MG TABLET] 90 tablet 0    Sig: TAKE 1 TABLET BY MOUTH EVERY DAY     Psychiatry: Antidepressants - bupropion Failed - 07/25/2022  1:34 AM      Failed - Cr in normal range and within 360 days    Creat  Date Value Ref Range Status  06/11/2022 1.36 (H) 0.50 - 1.05 mg/dL Final   Creatinine,U  Date Value Ref Range Status  03/12/2019 131.0 mg/dL Final   Creatinine, Urine  Date Value Ref Range Status  01/23/2022 69 20 - 275 mg/dL Final         Passed - AST in normal range and within 360 days    AST  Date Value Ref Range Status  06/11/2022 28 10 - 35 U/L Final         Passed - ALT in normal range and within 360 days    ALT  Date Value Ref Range Status  06/11/2022 22 6 - 29 U/L Final         Passed - Completed PHQ-2 or PHQ-9 in the last 360 days      Passed - Last BP in normal range    BP Readings from Last 1 Encounters:  06/18/22 138/74         Passed - Valid encounter within last 6 months    Recent Outpatient Visits           1 month ago Type 2 diabetes mellitus with diabetic mononeuropathy, without long-term current use of insulin ([place])   [facility] [place], Coralie Keens, NP   6 months ago Screening for colon cancer   [facility] [place], Coralie Keens, NP   6 months ago Opioid-induced constipation (OIC)   [facility] [place], Coralie Keens, NP   10 months ago Preop general physical exam   [facility] [place], Dani [place], [place]   10 months ago [facility] [place], Coralie Keens, NP       Future Appointments             In 6 months Baity, Coralie Keens, NP [facility], [facility]

## 2022-07-26 ENCOUNTER — Other Ambulatory Visit: Payer: Self-pay | Admitting: Internal Medicine

## 2022-07-26 DIAGNOSIS — M7918 Myalgia, other site: Secondary | ICD-10-CM

## 2022-07-26 DIAGNOSIS — G8929 Other chronic pain: Secondary | ICD-10-CM

## 2022-07-26 NOTE — Telephone Encounter (Signed)
Requested medication (s) are due for refill today: expired medication  Requested medication (s) are on the active medication list: yes  Last refill:  04/24/22-07/23/22 #270 0 refills  Future visit scheduled: yes in 6 months  Notes to clinic:  expired medication not delegated per protocol . Do you want to renew Rx?     Requested Prescriptions  Pending Prescriptions Disp Refills   tiZANidine (ZANAFLEX) 4 MG tablet [Pharmacy Med Name: TIZANIDINE HCL 4 MG TABLET] 270 tablet 0    Sig: TAKE 1 TABLET (4 MG TOTAL) BY MOUTH EVERY 8 (EIGHT) HOURS AS NEEDED FOR MUSCLE SPASMS     Not Delegated - Cardiovascular:  Alpha-2 Agonists - tizanidine Failed - 07/26/2022  1:37 AM      Failed - This refill cannot be delegated      Passed - Valid encounter within last 6 months    Recent Outpatient Visits           1 month ago Type 2 diabetes mellitus with diabetic mononeuropathy, without long-term current use of insulin [facility])   [facility] [place], Coralie Keens, NP   6 months ago Screening for colon cancer   [facility] [place], Coralie Keens, NP   6 months ago Opioid-induced constipation (OIC)   [facility] [place], Coralie Keens, NP   10 months ago Preop general physical exam   [facility], Dani [place], [place]   10 months ago [facility] [place], Coralie Keens, NP       Future Appointments             In 6 months Baity, Coralie Keens, NP [facility], [facility]

## 2022-07-31 ENCOUNTER — Encounter (INDEPENDENT_AMBULATORY_CARE_PROVIDER_SITE_OTHER): Payer: Medicare Other | Admitting: Cardiology

## 2022-07-31 DIAGNOSIS — I951 Orthostatic hypotension: Secondary | ICD-10-CM | POA: Diagnosis not present

## 2022-08-02 NOTE — Telephone Encounter (Signed)
Please see the MyChart message reply(ies) for my assessment and plan.    This patient gave consent for this Medical Advice Message and is aware that it may result in a bill to [employer], as well as the possibility of receiving a bill for a co-payment or deductible. They are an established patient, but are not seeking medical advice exclusively about a problem treated during an in person or video visit in the last seven days. I did not recommend an in person or video visit within seven days of my reply.    I spent a total of 6 minutes cumulative time within 7 days through [employer].  Candee Furbish, MD

## 2022-08-07 DIAGNOSIS — M47812 Spondylosis without myelopathy or radiculopathy, cervical region: Secondary | ICD-10-CM | POA: Diagnosis not present

## 2022-08-07 DIAGNOSIS — M170 Bilateral primary osteoarthritis of knee: Secondary | ICD-10-CM | POA: Diagnosis not present

## 2022-08-07 DIAGNOSIS — E7800 Pure hypercholesterolemia, unspecified: Secondary | ICD-10-CM | POA: Diagnosis not present

## 2022-08-07 DIAGNOSIS — M545 Low back pain, unspecified: Secondary | ICD-10-CM | POA: Diagnosis not present

## 2022-08-07 DIAGNOSIS — G4733 Obstructive sleep apnea (adult) (pediatric): Secondary | ICD-10-CM | POA: Diagnosis not present

## 2022-08-07 DIAGNOSIS — M7918 Myalgia, other site: Secondary | ICD-10-CM | POA: Diagnosis not present

## 2022-08-07 DIAGNOSIS — M25569 Pain in unspecified knee: Secondary | ICD-10-CM | POA: Diagnosis not present

## 2022-08-07 DIAGNOSIS — M797 Fibromyalgia: Secondary | ICD-10-CM | POA: Diagnosis not present

## 2022-08-07 DIAGNOSIS — Z969 Presence of functional implant, unspecified: Secondary | ICD-10-CM | POA: Diagnosis not present

## 2022-08-07 DIAGNOSIS — N183 Chronic kidney disease, stage 3 unspecified: Secondary | ICD-10-CM | POA: Diagnosis not present

## 2022-08-07 DIAGNOSIS — M792 Neuralgia and neuritis, unspecified: Secondary | ICD-10-CM | POA: Diagnosis not present

## 2022-08-08 MED ORDER — FLUDROCORTISONE ACETATE 0.1 MG PO TABS: 0.2000 mg | ORAL_TABLET | Freq: Every day | ORAL | 3 refills | Status: DC

## 2022-08-08 NOTE — Addendum Note (Signed)
Addended by: Shellia Cleverly on: 08/08/2022 07:58 AM   Modules accepted: Orders

## 2022-08-23 ENCOUNTER — Other Ambulatory Visit: Payer: Self-pay | Admitting: Internal Medicine

## 2022-08-23 NOTE — Telephone Encounter (Signed)
Requested Prescriptions  Pending Prescriptions Disp Refills   ezetimibe (ZETIA) 10 MG tablet [Pharmacy Med Name: EZETIMIBE 10 MG TABLET] 90 tablet 2    Sig: TAKE 1 TABLET BY MOUTH EVERY DAY     Cardiovascular:  Antilipid - Sterol Transport Inhibitors Failed - 08/23/2022 11:19 AM      Failed - Lipid Panel in normal range within the last 12 months    Cholesterol  Date Value Ref Range Status  06/11/2022 141 <200 mg/dL Final   LDL Cholesterol (Calc)  Date Value Ref Range Status  06/11/2022 67 mg/dL (calc) Final    Comment:    Reference range: <100 . Desirable range <100 mg/dL for primary prevention;   <70 mg/dL for patients with CHD or diabetic patients  with > or = 2 CHD risk factors. Marland Kitchen LDL-C is now calculated using the Martin-Hopkins  calculation, which is a validated novel method providing  better accuracy than the Friedewald equation in the  estimation of LDL-C.  Cresenciano Genre et al. Annamaria Helling. [id number]): 2061-2068  (http://education.[facility].com/faq/FAQ164)    Direct LDL  Date Value Ref Range Status  11/04/2017 63.0 mg/dL Final    Comment:    Optimal:  <100 mg/dLNear or Above Optimal:  100-129 mg/dLBorderline High:  130-159 mg/dLHigh:  160-189 mg/dLVery High:  >190 mg/dL   HDL  Date Value Ref Range Status  06/11/2022 51 > OR = 50 mg/dL Final   Triglycerides  Date Value Ref Range Status  06/11/2022 146 <150 mg/dL Final         Passed - AST in normal range and within 360 days    AST  Date Value Ref Range Status  06/11/2022 28 10 - 35 U/L Final         Passed - ALT in normal range and within 360 days    ALT  Date Value Ref Range Status  06/11/2022 22 6 - 29 U/L Final         Passed - Patient is not pregnant      Passed - Valid encounter within last 12 months    Recent Outpatient Visits           2 months ago Type 2 diabetes mellitus with diabetic mononeuropathy, without long-term current use of insulin ([place])   [facility]  [place], Coralie Keens, NP   7 months ago Screening for colon cancer   [facility] [place], Coralie Keens, NP   7 months ago Opioid-induced constipation (OIC)   [facility] [place], Coralie Keens, NP   11 months ago Preop general physical exam   [facility] [place], Dani Gobble, [place]   11 months ago [facility] [place], Coralie Keens, NP       Future Appointments             In 5 months [place], Coralie Keens, NP [facility], [facility]

## 2022-08-31 ENCOUNTER — Encounter: Payer: Self-pay | Admitting: Internal Medicine

## 2022-09-03 ENCOUNTER — Encounter: Payer: Self-pay | Admitting: Cardiology

## 2022-09-04 ENCOUNTER — Other Ambulatory Visit: Payer: Self-pay | Admitting: Cardiology

## 2022-09-05 DIAGNOSIS — E1142 Type 2 diabetes mellitus with diabetic polyneuropathy: Secondary | ICD-10-CM | POA: Diagnosis not present

## 2022-09-05 DIAGNOSIS — B351 Tinea unguium: Secondary | ICD-10-CM | POA: Diagnosis not present

## 2022-09-07 ENCOUNTER — Encounter: Payer: Self-pay | Admitting: Cardiology

## 2022-09-10 ENCOUNTER — Telehealth: Payer: Self-pay | Admitting: Internal Medicine

## 2022-09-10 NOTE — Telephone Encounter (Signed)
Contacted Melinda [name] to schedule their annual wellness visit. Appointment made for 10/12/2022.  Sherol Dade; [facility] Direct Dial: [phone]

## 2022-09-14 ENCOUNTER — Encounter: Payer: Self-pay | Admitting: Cardiology

## 2022-09-24 ENCOUNTER — Other Ambulatory Visit: Payer: Self-pay | Admitting: Internal Medicine

## 2022-09-24 DIAGNOSIS — M797 Fibromyalgia: Secondary | ICD-10-CM

## 2022-09-25 NOTE — Telephone Encounter (Signed)
Requested medication (s) are due for refill today - yes  Requested medication (s) are on the active medication list -yes  Future visit scheduled -yes  Last refill: 06/26/22 #270  Notes to clinic: non delegated Rx  Requested Prescriptions  Pending Prescriptions Disp Refills   pregabalin (LYRICA) 150 MG capsule [Pharmacy Med Name: PREGABALIN 150 MG CAPSULE] 270 capsule 0    Sig: TAKE 1 CAPSULE BY MOUTH THREE TIMES A DAY     Not Delegated - Neurology:  Anticonvulsants - Controlled - pregabalin Failed - 09/24/2022  5:23 PM      Failed - This refill cannot be delegated      Failed - Cr in normal range and within 360 days    Creat  Date Value Ref Range Status  06/11/2022 1.36 (H) 0.50 - 1.05 mg/dL Final   Creatinine,U  Date Value Ref Range Status  03/12/2019 131.0 mg/dL Final   Creatinine, Urine  Date Value Ref Range Status  01/23/2022 69 20 - 275 mg/dL Final         Passed - Completed PHQ-2 or PHQ-9 in the last 360 days      Passed - Valid encounter within last 12 months    Recent Outpatient Visits           3 months ago Type 2 diabetes mellitus with diabetic mononeuropathy, without long-term current use of insulin ([place])   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[employer], Patent examiner   In 4 months [place], Coralie Keens, NP [facility], [facility]               Requested Prescriptions  Pending Prescriptions Disp Refills    pregabalin (LYRICA) 150 MG capsule [Pharmacy Med Name: PREGABALIN 150 MG CAPSULE] 270 capsule 0    Sig: TAKE 1 CAPSULE BY MOUTH THREE TIMES A DAY     Not Delegated - Neurology:  Anticonvulsants - Controlled - pregabalin Failed - 09/24/2022  5:23 PM      Failed - This refill cannot be delegated      Failed - Cr in normal range and within 360 days    Creat  Date Value Ref Range Status  06/11/2022 1.36 (H) 0.50 - 1.05 mg/dL Final   Creatinine,U  Date Value Ref Range Status  03/12/2019 131.0 mg/dL Final   Creatinine, Urine  Date Value Ref Range Status  01/23/2022 69 20 - 275 mg/dL Final         Passed - Completed PHQ-2 or PHQ-9 in the last 360 days      Passed - Valid encounter within last 12 months    Recent Outpatient Visits           3 months ago Type 2 diabetes mellitus with diabetic mononeuropathy, without long-term current use of insulin ([place])   [place]  [facility] [place], Coralie Keens, NP   8 months ago Screening for colon cancer   [facility] [place], Coralie Keens, NP   8 months ago Opioid-induced constipation (OIC)   [facility] [place], Coralie Keens, NP   1 year ago Preop general physical exam   [facility] [place], Dani Gobble, [place]   1 year ago [facility] [place], Coralie Keens, NP       Future Appointments             In 1 month Harriet Pho, Terance Hart, PA-C [employer] [facility]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

## 2022-09-26 DIAGNOSIS — M25569 Pain in unspecified knee: Secondary | ICD-10-CM | POA: Diagnosis not present

## 2022-09-26 DIAGNOSIS — N183 Chronic kidney disease, stage 3 unspecified: Secondary | ICD-10-CM | POA: Diagnosis not present

## 2022-09-26 DIAGNOSIS — M47812 Spondylosis without myelopathy or radiculopathy, cervical region: Secondary | ICD-10-CM | POA: Diagnosis not present

## 2022-09-26 DIAGNOSIS — M797 Fibromyalgia: Secondary | ICD-10-CM | POA: Diagnosis not present

## 2022-09-26 DIAGNOSIS — M545 Low back pain, unspecified: Secondary | ICD-10-CM | POA: Diagnosis not present

## 2022-10-04 DIAGNOSIS — E1122 Type 2 diabetes mellitus with diabetic chronic kidney disease: Secondary | ICD-10-CM | POA: Diagnosis not present

## 2022-10-04 DIAGNOSIS — E7800 Pure hypercholesterolemia, unspecified: Secondary | ICD-10-CM | POA: Diagnosis not present

## 2022-10-04 DIAGNOSIS — M545 Low back pain, unspecified: Secondary | ICD-10-CM | POA: Diagnosis not present

## 2022-10-04 DIAGNOSIS — N1831 Chronic kidney disease, stage 3a: Secondary | ICD-10-CM | POA: Diagnosis not present

## 2022-10-04 DIAGNOSIS — G4733 Obstructive sleep apnea (adult) (pediatric): Secondary | ICD-10-CM | POA: Diagnosis not present

## 2022-10-04 DIAGNOSIS — I951 Orthostatic hypotension: Secondary | ICD-10-CM | POA: Diagnosis not present

## 2022-10-04 DIAGNOSIS — M170 Bilateral primary osteoarthritis of knee: Secondary | ICD-10-CM | POA: Diagnosis not present

## 2022-10-11 DIAGNOSIS — E119 Type 2 diabetes mellitus without complications: Secondary | ICD-10-CM | POA: Diagnosis not present

## 2022-10-11 LAB — HM DIABETES EYE EXAM

## 2022-10-12 ENCOUNTER — Encounter: Payer: Self-pay | Admitting: Internal Medicine

## 2022-10-12 ENCOUNTER — Ambulatory Visit (INDEPENDENT_AMBULATORY_CARE_PROVIDER_SITE_OTHER): Payer: 59

## 2022-10-12 VITALS — Ht 69.0 in | Wt 211.0 lb

## 2022-10-12 DIAGNOSIS — Z0000 Encounter for general adult medical examination without abnormal findings: Secondary | ICD-10-CM | POA: Diagnosis not present

## 2022-10-12 NOTE — Patient Instructions (Signed)
Melinda [name] , Thank you for taking time to come for your Medicare Wellness Visit. I appreciate your ongoing commitment to your health goals. Please review the following plan we discussed and let me know if I can assist you in the future.   These are the goals we discussed:  Goals      DIET - EAT MORE FRUITS AND VEGETABLES        This is a list of the screening recommended for you and due dates:  Health Maintenance  Topic Date Due   Zoster (Shingles) Vaccine (2 of 2) 05/07/2019   COVID-19 Vaccine (7 - 2023-24 season) 06/01/2022   Complete foot exam   06/22/2022   Pap Smear  08/07/2022   Pneumonia Vaccine (2 of 2 - PCV) 09/16/2022   Hemoglobin A1C  12/11/2022   Yearly kidney health urinalysis for diabetes  01/24/2023   Flu Shot  01/31/2023   Mammogram  05/01/2023   Yearly kidney function blood test for diabetes  06/12/2023   Eye exam for diabetics  10/11/2023   Medicare Annual Wellness Visit  10/12/2023   DTaP/Tdap/Td vaccine (5 - Td or Tdap) 07/08/2029   Colon Cancer Screening  02/23/2032   DEXA scan (bone density measurement)  Completed   Hepatitis C Screening: USPSTF Recommendation to screen - Ages 38-79 yo.  Completed   HIV Screening  Completed   HPV Vaccine  Aged Out    Advanced directives: no  Conditions/risks identified: none  Next appointment: Follow up in one year for your annual wellness visit 10/18/23 @ 1:00 pm by phone   Preventive Care 65 Years and Older, Female Preventive care refers to lifestyle choices and visits with your health care provider that can promote health and wellness. What does preventive care include? A yearly physical exam. This is also called an annual well check. Dental exams once or twice a year. Routine eye exams. Ask your health care provider how often you should have your eyes checked. Personal lifestyle choices, including: Daily care of your teeth and gums. Regular physical activity. Eating a healthy diet. Avoiding tobacco and drug  use. Limiting alcohol use. Practicing safe sex. Taking low-dose aspirin every day. Taking vitamin and mineral supplements as recommended by your health care provider. What happens during an annual well check? The services and screenings done by your health care provider during your annual well check will depend on your age, overall health, lifestyle risk factors, and family history of disease. Counseling  Your health care provider may ask you questions about your: Alcohol use. Tobacco use. Drug use. Emotional well-being. Home and relationship well-being. Sexual activity. Eating habits. History of falls. Memory and ability to understand (cognition). Work and work Astronomer. Reproductive health. Screening  You may have the following tests or measurements: Height, weight, and BMI. Blood pressure. Lipid and cholesterol levels. These may be checked every 5 years, or more frequently if you are over 3 years old. Skin check. Lung cancer screening. You may have this screening every year starting at age 2 if you have a 30-pack-year history of smoking and currently smoke or have quit within the past 15 years. Fecal occult blood test (FOBT) of the stool. You may have this test every year starting at age 40. Flexible sigmoidoscopy or colonoscopy. You may have a sigmoidoscopy every 5 years or a colonoscopy every 10 years starting at age 31. Hepatitis C blood test. Hepatitis B blood test. Sexually transmitted disease (STD) testing. Diabetes screening. This is done by checking your  blood sugar (glucose) after you have not eaten for a while (fasting). You may have this done every 1-3 years. Bone density scan. This is done to screen for osteoporosis. You may have this done starting at age 76. Mammogram. This may be done every 1-2 years. Talk to your health care provider about how often you should have regular mammograms. Talk with your health care provider about your test results, treatment  options, and if necessary, the need for more tests. Vaccines  Your health care provider may recommend certain vaccines, such as: Influenza vaccine. This is recommended every year. Tetanus, diphtheria, and acellular pertussis (Tdap, Td) vaccine. You may need a Td booster every 10 years. Zoster vaccine. You may need this after age 69. Pneumococcal 13-valent conjugate (PCV13) vaccine. One dose is recommended after age 52. Pneumococcal polysaccharide (PPSV23) vaccine. One dose is recommended after age 27. Talk to your health care provider about which screenings and vaccines you need and how often you need them. This information is not intended to replace advice given to you by your health care provider. Make sure you discuss any questions you have with your health care provider. Document Released: 07/15/2015 Document Revised: 03/07/2016 Document Reviewed: 04/19/2015 Elsevier Interactive Patient Education  2017 [place] Prevention in the Home Falls can cause injuries. They can happen to people of all ages. There are many things you can do to make your home safe and to help prevent falls. What can I do on the outside of my home? Regularly fix the edges of walkways and driveways and fix any cracks. Remove anything that might make you trip as you walk through a door, such as a raised step or threshold. Trim any bushes or trees on the path to your home. Use bright outdoor lighting. Clear any walking paths of anything that might make someone trip, such as rocks or tools. Regularly check to see if handrails are loose or broken. Make sure that both sides of any steps have handrails. Any raised decks and porches should have guardrails on the edges. Have any leaves, snow, or ice cleared regularly. Use sand or salt on walking paths during winter. Clean up any spills in your garage right away. This includes oil or grease spills. What can I do in the bathroom? Use night lights. Install grab  bars by the toilet and in the tub and shower. Do not use towel bars as grab bars. Use non-skid mats or decals in the tub or shower. If you need to sit down in the shower, use a plastic, non-slip stool. Keep the floor dry. Clean up any water that spills on the floor as soon as it happens. Remove soap buildup in the tub or shower regularly. Attach bath mats securely with double-sided non-slip rug tape. Do not have throw rugs and other things on the floor that can make you trip. What can I do in the bedroom? Use night lights. Make sure that you have a light by your bed that is easy to reach. Do not use any sheets or blankets that are too big for your bed. They should not hang down onto the floor. Have a firm chair that has side arms. You can use this for support while you get dressed. Do not have throw rugs and other things on the floor that can make you trip. What can I do in the kitchen? Clean up any spills right away. Avoid walking on wet floors. Keep items that you use a lot in  easy-to-reach places. If you need to reach something above you, use a strong step stool that has a grab bar. Keep electrical cords out of the way. Do not use floor polish or wax that makes floors slippery. If you must use wax, use non-skid floor wax. Do not have throw rugs and other things on the floor that can make you trip. What can I do with my stairs? Do not leave any items on the stairs. Make sure that there are handrails on both sides of the stairs and use them. Fix handrails that are broken or loose. Make sure that handrails are as long as the stairways. Check any carpeting to make sure that it is firmly attached to the stairs. Fix any carpet that is loose or worn. Avoid having throw rugs at the top or bottom of the stairs. If you do have throw rugs, attach them to the floor with carpet tape. Make sure that you have a light switch at the top of the stairs and the bottom of the stairs. If you do not have them,  ask someone to add them for you. What else can I do to help prevent falls? Wear shoes that: Do not have high heels. Have rubber bottoms. Are comfortable and fit you well. Are closed at the toe. Do not wear sandals. If you use a stepladder: Make sure that it is fully opened. Do not climb a closed stepladder. Make sure that both sides of the stepladder are locked into place. Ask someone to hold it for you, if possible. Clearly mark and make sure that you can see: Any grab bars or handrails. First and last steps. Where the edge of each step is. Use tools that help you move around (mobility aids) if they are needed. These include: Canes. Walkers. Scooters. Crutches. Turn on the lights when you go into a dark area. Replace any light bulbs as soon as they burn out. Set up your furniture so you have a clear path. Avoid moving your furniture around. If any of your floors are uneven, fix them. If there are any pets around you, be aware of where they are. Review your medicines with your doctor. Some medicines can make you feel dizzy. This can increase your chance of falling. Ask your doctor what other things that you can do to help prevent falls. This information is not intended to replace advice given to you by your health care provider. Make sure you discuss any questions you have with your health care provider. Document Released: 04/14/2009 Document Revised: 11/24/2015 Document Reviewed: 07/23/2014 Elsevier Interactive Patient Education  2017 [employer].

## 2022-10-12 NOTE — Progress Notes (Signed)
I connected with  Melinda [name] on 10/12/22 by a audio enabled telemedicine application and verified that I am speaking with the correct person using two identifiers.  Patient Location: Home  Provider Location: Office/Clinic  I discussed the limitations of evaluation and management by telemedicine. The patient expressed understanding and agreed to proceed.  Subjective:   Melinda [name] is a 67 y.o. female who presents for Medicare Annual (Subsequent) preventive examination.  Review of Systems     Cardiac Risk Factors include: advanced age (>65men, >51 women);hypertension     Objective:    Today's Vitals   10/12/22 1305  PainSc: 5    There is no height or weight on file to calculate BMI.     10/12/2022    1:11 PM 02/22/2022    9:42 AM 12/26/2021    1:19 PM 12/14/2021   12:06 PM 12/08/2021    3:44 PM 12/07/2021    2:16 PM 11/07/2021   12:48 PM  Advanced Directives  Does Patient Have a Medical Advance Directive? Yes Yes Yes Yes Yes Yes Yes  Type of Estate agent of [place];Living will Living will     Healthcare Power of [place];Living will  Does patient want to make changes to medical advance directive? No - Patient declined   No - Patient declined     Copy of Healthcare Power of Attorney in Chart? Yes - validated most recent copy scanned in chart (See row information)      No - copy requested    Current Medications (verified) Outpatient Encounter Medications as of 10/12/2022  Medication Sig   ACCU-CHEK GUIDE test strip USE 1 EACH BY OTHER ROUTE 3 (THREE) TIMES DAILY. USE AS INSTRUCTE   Accu-Chek Softclix Lancets lancets USE TO CHECK BLOOD SUGAR 1 TIME DAILY   albuterol (VENTOLIN HFA) 108 (90 Base) MCG/ACT inhaler Inhale 2 puffs into the lungs every 4 (four) hours as needed for wheezing or shortness of breath.   Ascorbic Acid (VITAMIN C) 1000 MG tablet Take 1,000 mg by mouth daily.   ASPIRIN 81 PO Take 81 mg by mouth daily.   Biotin 16109 MCG TABS Take 10,000  mcg by mouth daily.   Blood Glucose Calibration (ACCU-CHEK GUIDE CONTROL) LIQD 1 each by In Vitro route as needed.   buPROPion (WELLBUTRIN XL) 150 MG 24 hr tablet TAKE 1 TABLET BY MOUTH EVERY DAY   CALCIUM-MAGNESIUM-ZINC PO Take 3 capsules by mouth daily.   CINNAMON PO Take 1,000 mg by mouth daily.   Coenzyme Q10 (CO Q 10 PO) Take 1 tablet by mouth daily.   ezetimibe (ZETIA) 10 MG tablet TAKE 1 TABLET BY MOUTH EVERY DAY   fludrocortisone (FLORINEF) 0.1 MG tablet Take 2 tablets (0.2 mg total) by mouth daily.   glucosamine-chondroitin 500-400 MG tablet Take 2 tablets by mouth daily.    Insulin Pen Needle 31G X 5 MM MISC BD Pen Needles- brand specific Inject insulin via insulin pen 6 x daily   nitroGLYCERIN (NITROSTAT) 0.4 MG SL tablet PLACE 1 TABLET UNDER THE TONGUE EVERY 5 (FIVE) MINUTES AS NEEDED FOR CHEST PAIN.   Omega-3 Fatty Acids (FISH OIL) 1200 MG CAPS Take 1,200 mg by mouth in the morning.    PARoxetine (PAXIL) 20 MG tablet TAKE 1/2 TABLET BY MOUTH DAILY IN THE MORNING   pregabalin (LYRICA) 150 MG capsule TAKE 1 CAPSULE BY MOUTH THREE TIMES A DAY   rosuvastatin (CRESTOR) 20 MG tablet TAKE 1 TABLET BY MOUTH EVERY DAY   tirzepatide (MOUNJARO) 2.5  MG/0.5ML Pen Inject 2.5 mg into the skin once a week.   tiZANidine (ZANAFLEX) 4 MG tablet TAKE 1 TABLET (4 MG TOTAL) BY MOUTH EVERY 8 (EIGHT) HOURS AS NEEDED FOR MUSCLE SPASMS   vitamin E 180 MG (400 UNITS) capsule Take 400 Units by mouth daily.   Magnesium 400 MG CAPS Take 400 mg by mouth at bedtime. (Patient not taking: Reported on 10/12/2022)   midodrine (PROAMATINE) 5 MG tablet Take 1 tablet (5 mg total) by mouth 3 (three) times daily with meals. (Patient not taking: Reported on 10/12/2022)   No facility-administered encounter medications on file as of 10/12/2022.    Allergies (verified) Patient has no known allergies.   History: Past Medical History:  Diagnosis Date   Abdominal wall seroma    Amputation of great toe, right, traumatic  05/30/2010   Amputation of second toe, right, traumatic 09/2017   Anginal pain    Anxiety    Aortic atherosclerosis    Bilateral carotid artery stenosis 12/24/2014   a.) Doppler 12/24/2014: 60-79% BILATERAL ICA. b.) Doppler 06/28/2015: 40-59% BILATERAL ICA. c.) Dopplers 04/23/2019, 04/22/2020, 10/20/2021: 40-59% RICA and 1-39% LICA.   Blue toes    2nd toe on right foot, will get appt.   Bulging disc    CAD (coronary artery disease) 2009   a.) PCI with placement of a 15 x 30 mm Promus DES to mLAD. b.) LHC 02/27/2011: EF 60%; no sig CAD; mild lum irreg at ostium of diagnoal (jailed by stent).   Chicken pox    Chronic fatigue    Chronic, continuous use of opioids    CKD (chronic kidney disease), stage III    COVID 02/22/2022   Degenerative disc disease    Depression    Diverticulosis    Facet joint disease    Failed cervical fusion    Fibromyalgia    Hiatal hernia    Hyperlipidemia    IBS (irritable bowel syndrome)    MCL deficiency, knee    MRSA (methicillin resistant staph aureus) culture positive 2011   GREAT TOE RIGHT FOOT   Neuropathy 04/18/2010   Orthostatic hypotension 01/2020   a.) multiple pre-syncopal episodes; takes proamatine   OSA on CPAP 08/17/2003   Osteoarthritis    a.) back, neck, hands, knees   Restless leg syndrome    Sepsis 09/2013   Spinal headache 11/07/2021   Spinal stenosis    T2DM (type 2 diabetes mellitus)    Wolff-Parkinson-White (WPW) syndrome 1992   a.) s/p ablations; unsuccessful. Now has rare episodes   Past Surgical History:  Procedure Laterality Date   AMPUTATION TOE Right 02/01/2017   Procedure: AMPUTATION TOE-RIGHT 2ND MPJ;  Surgeon: Recardo Evangelist, DPM;  Location: [facility] ORS;  Service: Podiatry;  Laterality: Right;   ANTERIOR CERVICAL DECOMP/DISCECTOMY FUSION N/A 07/28/2014   Procedure: ANTERIOR CERVICAL DECOMPRESSION/DISCECTOMY FUSION CERVICAL 3-4,4-5,5-6 LEVELS WITH INSTRUMENTATION AND ALLOGRAFT;  Surgeon: Emilee Hero, MD;   Location[facility];  Service: Orthopedics;  Laterality: N/A;  Anterior cervical decompression fusion, cervical 3-4, cervical 4-5, cervical 5-6 with instrumentation and allograft   BACK SURGERY     X 3 1979, 1994, 1995   BLEPHAROPLASTY Bilateral 2013   CARDIAC ELECTROPHYSIOLOGY STUDY AND ABLATION N/A 1992   CARPAL TUNNEL RELEASE Right    CHOLECYSTECTOMY  2003   COLONOSCOPY WITH PROPOFOL N/A 02/22/2022   Procedure: COLONOSCOPY WITH PROPOFOL;  Surgeon: Wyline Mood, MD;  Location: [facility] ENDOSCOPY;  Service: Gastroenterology;  Laterality: N/A;   CORONARY ANGIOPLASTY WITH STENT PLACEMENT N/A  2009   Procedure: CORONARY ANGIOPLASTY WITH STENT PLACEMENT   ENDOMETRIAL ABLATION  2007   EXCISION PARTIAL PHALANX Left 09/28/2021   Procedure: EXCISION PARTIAL PHALANX - FIRST;  Surgeon: Rosetta Posner, DPM;  Location: [facility];  Service: Podiatry;  Laterality: Left;   GALLBLADDER SURGERY     HAMMER TOE SURGERY Right 10/17/2017   Procedure: HAMMER TOE CORRECTION-4TH TOE;  Surgeon: Recardo Evangelist, DPM;  Location: [facility];  Service: Podiatry;  Laterality: Right;  LMA- WITH LOCAL Diabetic - diet controlled   HAND SURGERY Left    INFUSION PUMP IMPLANTATION     X2 with morphine and baclofen   INTRATHECAL PUMP REVISION N/A 04/25/2018   Procedure: Intrathecal pump replacement;  Surgeon: Odette Fraction, MD;  Location: [facility] OR;  Service: Neurosurgery;  Laterality: N/A;  right   INTRATHECAL PUMP REVISION Right 04/25/2018   INTRATHECAL PUMP REVISION N/A 12/12/2018   Procedure: Intrathecal pump revision with exploration of pocket;  Surgeon: Odette Fraction, MD;  Location: [facility] OR;  Service: Neurosurgery;  Laterality: N/A;  Intrathecal pump revision with exploration of pocket   INTRATHECAL PUMP REVISION N/A 03/11/2020   Procedure: INTRATHECAL PUMP REVISION;  Surgeon: Delano Metz, MD;  Location: [facility] ORS;  Service: Neurosurgery;  Laterality: N/A;   INTRATHECAL PUMP REVISION Right 12/14/2021    Procedure: INTRATHECAL PUMP REMOVAL;  Surgeon: Delano Metz, MD;  Location: [facility] ORS;  Service: Neurosurgery;  Laterality: Right;   IRRIGATION AND DEBRIDEMENT FOOT Right 02/23/2017   Procedure: IRRIGATION AND DEBRIDEMENT FOOT-right foot;  Surgeon: Gwyneth Revels, DPM;  Location: [facility] ORS;  Service: Podiatry;  Laterality: Right;   KNEE SURGERY Right 05/24/2020   LEFT HEART CATH AND CORONARY ANGIOGRAPHY Left 02/27/2011   Procedure: LEFT HEART CATH AND CORONARY ANGIOGRAPHY   PAIN PUMP REVISION N/A 08/15/2018   Procedure: Intrathecal PUMP REVISION;  Surgeon: Odette Fraction, MD;  Location: [facility] OR;  Service: Neurosurgery;  Laterality: N/A;  INTRATHECAL PUMP REVISION   PLANTAR FASCIA SURGERY Bilateral    TOE AMPUTATION Right    RIGHT great toe (1st digit)   TOE AMPUTATION Right    a.) RIGHT second toe (2nd digit)   TOE SURGERY     then revision 8/18   TOTAL KNEE ARTHROPLASTY Right 05/24/2020   Procedure: RIGHT TOTAL KNEE ARTHROPLASTY;  Surgeon: Marcene Corning, MD;  Location: [facility] ORS;  Service: Orthopedics;  Laterality: Right;   TOTAL KNEE REVISION Right 09/06/2020   : RIGHT KNEE POLY REVISION;  Marcene Corning, MD) PLAN TO DISCHARGE FROM PACU   WOUND DEBRIDEMENT Left 09/28/2021   Procedure: DEBRIDEMENT SKIN, SUBCUTANEOUS TISSUE - 11042;  Surgeon: Rosetta Posner, DPM;  Location: [facility]ERY [facility]TR;  Service: Podiatry;  Laterality: Left;  Diabetic   Family History  Problem Relation Age of Onset   Cancer Mother    Lung cancer Mother    Arthritis Father    Heart disease Father    Diabetes Father    Dementia Maternal Grandmother    Alcohol abuse Paternal Grandfather    Heart disease Paternal Grandfather    Stroke Paternal Grandfather    Social History   Socioeconomic History   Marital status: Significant Other    Spouse name: Not on file   Number of children: Not on file   Years of education: Not on file   Highest education level: Not on file  Occupational History   Not on file   Tobacco Use   Smoking status: Former    Packs/day: 1.50    Years: 32.00  Additional pack years: 0.00    Total pack years: 48.00    Types: Cigarettes    Quit date: 07/22/2007    Years since quitting: 15.2   Smokeless tobacco: Not on file  Vaping Use   Vaping Use: Never used  Substance and Sexual Activity   Alcohol use: Yes    Comment: occ - Holidays   Drug use: Never    Comment: prescribed pain pump and oxy   Sexual activity: Yes  Other Topics Concern   Not on file  Social History Narrative   Lives at home with a partner. Independent at baseline   Social Determinants of Health   Financial Resource Strain: Low Risk  (10/12/2022)   Overall Financial Resource Strain (CARDIA)    Difficulty of Paying Living Expenses: Not hard at all  Food Insecurity: No Food Insecurity (10/12/2022)   Hunger Vital Sign    Worried About Running Out of Food in the Last Year: Never true    Ran Out of Food in the Last Year: Never true  Transportation Needs: No Transportation Needs (10/12/2022)   PRAPARE - Administrator, Civil Service (Medical): No    Lack of Transportation (Non-Medical): No  Physical Activity: Insufficiently Active (10/12/2022)   Exercise Vital Sign    Days of Exercise per Week: 2 days    Minutes of Exercise per Session: 60 min  Stress: No Stress Concern Present (10/12/2022)   [employer] of Occupational Health - Occupational Stress Questionnaire    Feeling of Stress : Not at all  Social Connections: Unknown (10/12/2022)   Social Connection and Isolation Panel [NHANES]    Frequency of Communication with Friends and Family: Once a week    Frequency of Social Gatherings with Friends and Family: Not on file    Attends Religious Services: Never    Database administrator or Organizations: No    Attends Engineer, structural: Never    Marital Status: Married    Tobacco Counseling Counseling given: Not Answered   Clinical Intake:  Pre-visit preparation  completed: Yes  Pain : 0-10 Pain Score: 5  Pain Type: Chronic pain Pain Location: Back Pain Orientation: Lower Pain Radiating Towards: down to thighs     Nutritional Risks: None Diabetes: Yes CBG done?: No Did pt. bring in CBG monitor from home?: No  How often do you need to have someone help you when you read instructions, pamphlets, or other written materials from your doctor or pharmacy?: 1 - Never  Diabetic?no  Interpreter Needed?: No  Information entered by :: Kennedy Bucker, LPN   Activities of Daily Living    10/12/2022    1:12 PM 10/08/2022   10:37 AM  In your present state of health, do you have any difficulty performing the following activities:  Hearing? 0 0  Vision? 0 0  Difficulty concentrating or making decisions? 0 1  Walking or climbing stairs? 0 0  Dressing or bathing? 0 0  Doing errands, shopping? 0 0  Preparing Food and eating ? N N  Using the Toilet? N N  In the past six months, have you accidently leaked urine? Y Y  Do you have problems with loss of bowel control? N N  Managing your Medications? N N  Managing your Finances? Malvin Johns  Housekeeping or managing your Housekeeping? Malvin Johns    Patient Care Team: Lorre Munroe, NP as PCP - General (Internal Medicine) Jake Bathe, MD as PCP - Cardiology (Cardiology) [place],  Heather, OD (Optometry)  Indicate any recent Medical Services you may have received from other than [facility] providers in the past year (date may be approximate).     Assessment:   This is a routine wellness examination for Melinda [name].  Hearing/Vision screen Hearing Screening - Comments:: No aids Vision Screening - Comments:: Wears glasses= [place] in [place]  Dietary issues and exercise activities discussed: Current Exercise Habits: Home exercise routine, Time (Minutes): 60, Frequency (Times/Week): 2, Weekly Exercise (Minutes/Week): 120, Intensity: Mild   Goals Addressed             This Visit's Progress    DIET - EAT MORE FRUITS AND  VEGETABLES         Depression Screen    10/12/2022    1:09 PM 06/11/2022    2:46 PM 01/29/2022   12:53 PM 01/23/2022    2:33 PM 12/26/2021    1:19 PM 12/07/2021    2:16 PM 11/07/2021   12:48 PM  PHQ 2/9 Scores  PHQ - 2 Score 0 0 0 0 0 0 0  PHQ- 9 Score 0   0       Fall Risk    10/12/2022    1:12 PM 10/08/2022   10:37 AM 06/11/2022    2:46 PM 05/16/2022    1:49 PM 01/29/2022   12:53 PM  Fall Risk   Falls in the past year? 0 0 0 0 0  Number falls in past yr: 0 0   0  Injury with Fall? 0 0 0  0  Risk for fall due to : No Fall Risks      Follow up Falls prevention discussed;Falls evaluation completed        FALL RISK PREVENTION PERTAINING TO THE HOME:  Any stairs in or around the home? Yes  If so, are there any without handrails? No  Home free of loose throw rugs in walkways, pet beds, electrical cords, etc? Yes  Adequate lighting in your home to reduce risk of falls? Yes   ASSISTIVE DEVICES UTILIZED TO PREVENT FALLS:  Life alert? No  Use of a cane, walker or w/c? No  Grab bars in the bathroom? No  Shower chair or bench in shower? No  Elevated toilet seat or a handicapped toilet? No    Cognitive Function:        10/12/2022    1:17 PM 10/05/2021   11:31 AM  6CIT Screen  What Year? 0 points 0 points  What month? 0 points 0 points  What time? 0 points 0 points  Count back from 20 0 points 0 points  Months in reverse 0 points 0 points  Repeat phrase 0 points 0 points  Total Score 0 points 0 points    Immunizations Immunization History  Administered Date(s) Administered   Influenza,inj,Quad PF,6+ Mos 07/28/2015, 05/18/2016, 08/06/2017, 02/13/2019, 02/20/2020, 04/06/2022   Influenza-Unspecified 04/15/1998, 05/19/2014, 04/10/2021   PFIZER(Purple Top)SARS-COV-2 Vaccination 09/19/2019, 10/13/2019, 07/03/2020, 12/21/2020   Pfizer Covid-19 Vaccine Bivalent Booster 70yrs & up 04/10/2021   Pneumococcal Polysaccharide-23 07/28/2015, 06/22/2021   Rabies, IM 07/09/2019,  07/12/2019, 07/16/2019, 07/23/2019   Td 07/03/1991   Td (Adult),unspecified 07/03/1991   Tdap 08/03/2007, 07/09/2019   Unspecified SARS-COV-2 Vaccination 04/06/2022   Zoster Recombinat (Shingrix) 03/12/2019    TDAP status: Up to date  Flu Vaccine status: Up to date  Pneumococcal vaccine status: Up to date  Covid-19 vaccine status: Completed vaccines  Qualifies for Shingles Vaccine? Yes   Zostavax completed No   Shingrix Completed?:  Yes  Screening Tests Health Maintenance  Topic Date Due   Zoster Vaccines- Shingrix (2 of 2) 05/07/2019   COVID-19 Vaccine (7 - 2023-24 season) 06/01/2022   FOOT EXAM  06/22/2022   PAP SMEAR-Modifier  08/07/2022   Pneumonia Vaccine 44+ Years old (2 of 2 - PCV) 09/16/2022   HEMOGLOBIN A1C  12/11/2022   Diabetic kidney evaluation - Urine ACR  01/24/2023   INFLUENZA VACCINE  01/31/2023   MAMMOGRAM  05/01/2023   Diabetic kidney evaluation - eGFR measurement  06/12/2023   OPHTHALMOLOGY EXAM  10/11/2023   Medicare Annual Wellness (AWV)  10/12/2023   DTaP/Tdap/Td (5 - Td or Tdap) 07/08/2029   COLONOSCOPY (Pts 45-60yrs Insurance coverage will need to be confirmed)  02/23/2032   DEXA SCAN  Completed   Hepatitis C Screening  Completed   HIV Screening  Completed   HPV VACCINES  Aged Out    Health Maintenance  Health Maintenance Due  Topic Date Due   Zoster Vaccines- Shingrix (2 of 2) 05/07/2019   COVID-19 Vaccine (7 - 2023-24 season) 06/01/2022   FOOT EXAM  06/22/2022   PAP SMEAR-Modifier  08/07/2022   Pneumonia Vaccine 18+ Years old (2 of 2 - PCV) 09/16/2022    Colorectal cancer screening: Type of screening: Colonoscopy. Completed 02/22/22. Repeat every 10 years  Mammogram status: Completed 04/30/22. Repeat every year  Bone Density status: Completed 04/30/22. Results reflect: Bone density results: NORMAL. Repeat every 5 years.  Lung Cancer Screening: (Low Dose CT Chest recommended if Age 43-80 years, 30 pack-year currently smoking OR have  quit w/in 15years.) does not qualify.    Additional Screening:  Hepatitis C Screening: does qualify; Completed 05/18/16  Vision Screening: Recommended annual ophthalmology exams for early detection of glaucoma and other disorders of the eye. Is the patient up to date with their annual eye exam?  Yes  Who is the provider or what is the name of the office in which the patient attends annual eye exams? [place] If pt is not established with a provider, would they like to be referred to a provider to establish care? No .   Dental Screening: Recommended annual dental exams for proper oral hygiene  Community Resource Referral / Chronic Care Management: CRR required this visit?  No   CCM required this visit?  No      Plan:     I have personally reviewed and noted the following in the patient's chart:   Medical and social history Use of alcohol, tobacco or illicit drugs  Current medications and supplements including opioid prescriptions. Patient is not currently taking opioid prescriptions. Functional ability and status Nutritional status Physical activity Advanced directives List of other physicians Hospitalizations, surgeries, and ER visits in previous 12 months Vitals Screenings to include cognitive, depression, and falls Referrals and appointments  In addition, I have reviewed and discussed with patient certain preventive protocols, quality metrics, and best practice recommendations. A written personalized care plan for preventive services as well as general preventive health recommendations were provided to patient.     Hal Hope, LPN   [id number]   Nurse Notes: none

## 2022-10-15 ENCOUNTER — Other Ambulatory Visit (HOSPITAL_COMMUNITY): Payer: Self-pay | Admitting: Physician Assistant

## 2022-10-15 DIAGNOSIS — I6523 Occlusion and stenosis of bilateral carotid arteries: Secondary | ICD-10-CM

## 2022-10-22 ENCOUNTER — Ambulatory Visit (HOSPITAL_COMMUNITY)
Admission: RE | Admit: 2022-10-22 | Discharge: 2022-10-22 | Disposition: A | Discharge status: Home / Self Care | Payer: 59 | Source: Ambulatory Visit | Attending: Cardiovascular Disease | Admitting: Cardiovascular Disease

## 2022-10-22 DIAGNOSIS — I6523 Occlusion and stenosis of bilateral carotid arteries: Secondary | ICD-10-CM | POA: Insufficient documentation

## 2022-10-23 ENCOUNTER — Telehealth: Payer: Self-pay | Admitting: *Deleted

## 2022-10-23 DIAGNOSIS — I6523 Occlusion and stenosis of bilateral carotid arteries: Secondary | ICD-10-CM

## 2022-10-23 NOTE — Telephone Encounter (Signed)
-----   Message from Beatrice Lecher, [place] sent at 10/23/2022  4:30 PM EDT ----- Results sent to [name] via MyChart. See MyChart comment. Will send copy to PCP as FYI PLAN: -Repeat Carotid US in 1 year (Carotid Artery Disease)  [name]  Your ultrasound shows mild plaque in both carotid arteries. This is actually somewhat better than the last study done. We will repeat this in 1 year. Continue current medications/treatment plan and follow up as scheduled.  Tereso Newcomer, PA-C    10/23/2022 4:28 PM

## 2022-10-24 ENCOUNTER — Other Ambulatory Visit: Payer: Self-pay | Admitting: Internal Medicine

## 2022-10-24 NOTE — Telephone Encounter (Signed)
Requested Prescriptions  Pending Prescriptions Disp Refills   buPROPion (WELLBUTRIN XL) 150 MG 24 hr tablet [Pharmacy Med Name: BUPROPION HCL XL 150 MG TABLET] 90 tablet 0    Sig: TAKE 1 TABLET BY MOUTH EVERY DAY     Psychiatry: Antidepressants - bupropion Failed - 10/24/2022  1:42 AM      Failed - Cr in normal range and within 360 days    Creat  Date Value Ref Range Status  06/11/2022 1.36 (H) 0.50 - 1.05 mg/dL Final   Creatinine,U  Date Value Ref Range Status  03/12/2019 131.0 mg/dL Final   Creatinine, Urine  Date Value Ref Range Status  01/23/2022 69 20 - 275 mg/dL Final         Passed - AST in normal range and within 360 days    AST  Date Value Ref Range Status  06/11/2022 28 10 - 35 U/L Final         Passed - ALT in normal range and within 360 days    ALT  Date Value Ref Range Status  06/11/2022 22 6 - 29 U/L Final         Passed - Completed PHQ-2 or PHQ-9 in the last 360 days      Passed - Last BP in normal range    BP Readings from Last 1 Encounters:  06/18/22 138/74         Passed - Valid encounter within last 6 months    Recent Outpatient Visits           4 months ago Type 2 diabetes mellitus with diabetic mononeuropathy, without long-term current use of insulin [facility])   [place] [facility] [place], Salvadore Oxford, NP   9 months ago Screening for colon cancer   [place] [facility] [place], Salvadore Oxford, NP   9 months ago Opioid-induced constipation (OIC)   [place] [facility] [place], Salvadore Oxford, NP   1 year ago Preop general physical exam   [place] [facility] [place], Oswaldo Conroy, [place]   1 year ago Orthostasis   [place] [facility] [place], Salvadore Oxford, NP       Future Appointments             In 2 days Sharlene Dory, PA-C [facility] [employer], Administrator   In 3 months [place], Salvadore Oxford, NP [place] [facility]

## 2022-10-25 ENCOUNTER — Other Ambulatory Visit: Payer: Self-pay | Admitting: Cardiology

## 2022-10-25 ENCOUNTER — Other Ambulatory Visit: Payer: Self-pay | Admitting: Internal Medicine

## 2022-10-25 DIAGNOSIS — G8929 Other chronic pain: Secondary | ICD-10-CM

## 2022-10-25 NOTE — Telephone Encounter (Signed)
Requested medication (s) are due for refill today - yes  Requested medication (s) are on the active medication list -yes  Future visit scheduled -yes  Last refill: 07/26/22 #270  Notes to clinic: non delegated Rx  Requested Prescriptions  Pending Prescriptions Disp Refills   tiZANidine (ZANAFLEX) 4 MG tablet [Pharmacy Med Name: TIZANIDINE HCL 4 MG TABLET] 270 tablet 0    Sig: TAKE 1 TABLET (4 MG TOTAL) BY MOUTH EVERY 8 (EIGHT) HOURS AS NEEDED FOR MUSCLE SPASMS     Not Delegated - Cardiovascular:  Alpha-2 Agonists - tizanidine Failed - 10/25/2022  1:48 AM      Failed - This refill cannot be delegated      Passed - Valid encounter within last 6 months    Recent Outpatient Visits           4 months ago Type 2 diabetes mellitus with diabetic mononeuropathy, without long-term current use of insulin (HCC)   [place] [facility] [place], Salvadore Oxford, NP   9 months ago Screening for colon cancer   [place] [facility] [place], Salvadore Oxford, NP   9 months ago Opioid-induced constipation (OIC)   [place] [facility] [place], Salvadore Oxford, NP   1 year ago Preop general physical exam   [place] [facility] [place], Oswaldo Conroy, [place]   1 year ago Orthostasis   [place] [facility] [place], Salvadore Oxford, NP       Future Appointments             Tomorrow Asa Lente, Bernadette Hoit, PA-C [facility], [facility]   In 3 months [place], Salvadore Oxford, NP [place] [facility], [facility]               Requested Prescriptions  Pending Prescriptions Disp Refills   tiZANidine (ZANAFLEX) 4 MG tablet [Pharmacy Med Name: TIZANIDINE HCL 4 MG TABLET] 270 tablet 0    Sig: TAKE 1 TABLET (4 MG TOTAL) BY MOUTH EVERY 8 (EIGHT) HOURS AS NEEDED FOR MUSCLE SPASMS     Not Delegated - Cardiovascular:  Alpha-2 Agonists - tizanidine Failed - 10/25/2022  1:48 AM      Failed - This refill cannot be delegated       Passed - Valid encounter within last 6 months    Recent Outpatient Visits           4 months ago Type 2 diabetes mellitus with diabetic mononeuropathy, without long-term current use of insulin [facility])   [place] [facility] [place], Salvadore Oxford, NP   9 months ago Screening for colon cancer   [place] [facility] [place], Salvadore Oxford, NP   9 months ago Opioid-induced constipation (OIC)   [place] [facility] [place], Salvadore Oxford, NP   1 year ago Preop general physical exam   [place] [facility] [place], Oswaldo Conroy, [place]   1 year ago Orthostasis    [facility] [place], Salvadore Oxford, NP       Future Appointments             Tomorrow Asa Lente, Bernadette Hoit, PA-C  [facility] at [facility], [facility]   In 3 months [place], Salvadore Oxford, NP  [facility], [facility]

## 2022-10-26 ENCOUNTER — Encounter: Payer: Self-pay | Admitting: Physician Assistant

## 2022-10-26 ENCOUNTER — Ambulatory Visit: Payer: 59 | Attending: Physician Assistant | Admitting: Physician Assistant

## 2022-10-26 VITALS — BP 124/82 | HR 77 | Ht 69.0 in | Wt 199.0 lb

## 2022-10-26 DIAGNOSIS — I2510 Atherosclerotic heart disease of native coronary artery without angina pectoris: Secondary | ICD-10-CM

## 2022-10-26 DIAGNOSIS — I6523 Occlusion and stenosis of bilateral carotid arteries: Secondary | ICD-10-CM

## 2022-10-26 DIAGNOSIS — I951 Orthostatic hypotension: Secondary | ICD-10-CM

## 2022-10-26 DIAGNOSIS — E7800 Pure hypercholesterolemia, unspecified: Secondary | ICD-10-CM

## 2022-10-26 NOTE — Patient Instructions (Signed)
Medication Instructions:  Your physician recommends that you continue on your current medications as directed. Please refer to the Current Medication list given to you today.  *If you need a refill on your cardiac medications before your next appointment, please call your pharmacy*  Lab Work: None ordered If you have labs (blood work) drawn today and your tests are completely normal, you will receive your results only by: MyChart Message (if you have MyChart) OR A paper copy in the mail If you have any lab test that is abnormal or we need to change your treatment, we will call you to review the results.  Follow-Up: At [facility], you and your health needs are our priority.  As part of our continuing mission to provide you with exceptional heart care, we have created designated Provider Care Teams.  These Care Teams include your primary Cardiologist (physician) and Advanced Practice Providers (APPs -  Physician Assistants and Nurse Practitioners) who all work together to provide you with the care you need, when you need it.  Your next appointment:   3-4 month(s) or next available  Provider:   Donato Schultz, MD  Other Instructions Check your blood pressure daily, 1-2 hrs after taking your morning medications, keep a log to bring with you to your next follow up office visit.  Low-Sodium Eating Plan Sodium, which is an element that makes up salt, helps you maintain a healthy balance of fluids in your body. Too much sodium can increase your blood pressure and cause fluid and waste to be held in your body. Your health care provider or dietitian may recommend following this plan if you have high blood pressure (hypertension), kidney disease, liver disease, or heart failure. Eating less sodium can help lower your blood pressure, reduce swelling, and protect your heart, liver, and kidneys. What are tips for following this plan? Reading food labels The Nutrition Facts label lists the  amount of sodium in one serving of the food. If you eat more than one serving, you must multiply the listed amount of sodium by the number of servings. Choose foods with less than 140 mg of sodium per serving. Avoid foods with 300 mg of sodium or more per serving. Shopping  Look for lower-sodium products, often labeled as "low-sodium" or "no salt added." Always check the sodium content, even if foods are labeled as "unsalted" or "no salt added." Buy fresh foods. Avoid canned foods and pre-made or frozen meals. Avoid canned, cured, or processed meats. Buy breads that have less than 80 mg of sodium per slice. Cooking  Eat more home-cooked food and less restaurant, buffet, and fast food. Avoid adding salt when cooking. Use salt-free seasonings or herbs instead of table salt or sea salt. Check with your health care provider or pharmacist before using salt substitutes. Cook with plant-based oils, such as canola, sunflower, or olive oil. Meal planning When eating at a restaurant, ask that your food be prepared with less salt or no salt, if possible. Avoid dishes labeled as brined, pickled, cured, smoked, or made with soy sauce, miso, or teriyaki sauce. Avoid foods that contain MSG (monosodium glutamate). MSG is sometimes added to [place] food, bouillon, and some canned foods. Make meals that can be grilled, baked, poached, roasted, or steamed. These are generally made with less sodium. General information Most people on this plan should limit their sodium intake to 1,500-2,000 mg (milligrams) of sodium each day. What foods should I eat? Fruits Fresh, frozen, or canned fruit. Fruit juice.  Vegetables Fresh or frozen vegetables. "No salt added" canned vegetables. "No salt added" tomato sauce and paste. Low-sodium or reduced-sodium tomato and vegetable juice. Grains Low-sodium cereals, including oats, puffed wheat and rice, and shredded wheat. Low-sodium crackers. Unsalted rice. Unsalted pasta.  Low-sodium bread. Whole-grain breads and whole-grain pasta. Meats and other proteins Fresh or frozen (no salt added) meat, poultry, seafood, and fish. Low-sodium canned tuna and salmon. Unsalted nuts. Dried peas, beans, and lentils without added salt. Unsalted canned beans. Eggs. Unsalted nut butters. Dairy Milk. Soy milk. Cheese that is naturally low in sodium, such as ricotta cheese, fresh mozzarella, or Swiss cheese. Low-sodium or reduced-sodium cheese. Cream cheese. Yogurt. Seasonings and condiments Fresh and dried herbs and spices. Salt-free seasonings. Low-sodium mustard and ketchup. Sodium-free salad dressing. Sodium-free light mayonnaise. Fresh or refrigerated horseradish. Lemon juice. Vinegar. Other foods Homemade, reduced-sodium, or low-sodium soups. Unsalted popcorn and pretzels. Low-salt or salt-free chips. The items listed above may not be a complete list of foods and beverages you can eat. Contact a dietitian for more information. What foods should I avoid? Vegetables Sauerkraut, pickled vegetables, and relishes. Olives. [place] fries. Onion rings. Regular canned vegetables (not low-sodium or reduced-sodium). Regular canned tomato sauce and paste (not low-sodium or reduced-sodium). Regular tomato and vegetable juice (not low-sodium or reduced-sodium). Frozen vegetables in sauces. Grains Instant hot cereals. Bread stuffing, pancake, and biscuit mixes. Croutons. Seasoned rice or pasta mixes. Noodle soup cups. Boxed or frozen macaroni and cheese. Regular salted crackers. Self-rising flour. Meats and other proteins Meat or fish that is salted, canned, smoked, spiced, or pickled. Precooked or cured meat, such as sausages or meat loaves. Melinda [name]. Ham. Pepperoni. Hot dogs. Corned beef. Chipped beef. Salt pork. Jerky. Pickled herring. Anchovies and sardines. Regular canned tuna. Salted nuts. Dairy Processed cheese and cheese spreads. Hard cheeses. Cheese curds. Blue cheese. Feta cheese. String  cheese. Regular cottage cheese. Buttermilk. Canned milk. Fats and oils Salted butter. Regular margarine. Ghee. Bacon fat. Seasonings and condiments Onion salt, garlic salt, seasoned salt, table salt, and sea salt. Canned and packaged gravies. Worcestershire sauce. Tartar sauce. Barbecue sauce. Teriyaki sauce. Soy sauce, including reduced-sodium. Steak sauce. Fish sauce. Oyster sauce. Cocktail sauce. Horseradish that you find on the shelf. Regular ketchup and mustard. Meat flavorings and tenderizers. Bouillon cubes. Hot sauce. Pre-made or packaged marinades. Pre-made or packaged taco seasonings. Relishes. Regular salad dressings. Salsa. Other foods Salted popcorn and pretzels. Corn chips and puffs. Potato and tortilla chips. Canned or dried soups. Pizza. Frozen entrees and pot pies. The items listed above may not be a complete list of foods and beverages you should avoid. Contact a dietitian for more information. Summary Eating less sodium can help lower your blood pressure, reduce swelling, and protect your heart, liver, and kidneys. Most people on this plan should limit their sodium intake to 1,500-2,000 mg (milligrams) of sodium each day. Canned, boxed, and frozen foods are high in sodium. Restaurant foods, fast foods, and pizza are also very high in sodium. You also get sodium by adding salt to food. Try to cook at home, eat more fresh fruits and vegetables, and eat less fast food and canned, processed, or prepared foods. This information is not intended to replace advice given to you by your health care provider. Make sure you discuss any questions you have with your health care provider. Document Revised: 05/25/2019 Document Reviewed: 05/20/2019 Elsevier Patient Education  2023 [employer] Inc[employer]  Heart-Healthy Eating Plan Many factors influence your heart health, including eating and exercise habits.  Heart health is also called coronary health. Coronary risk increases with abnormal blood fat  (lipid) levels. A heart-healthy eating plan includes limiting unhealthy fats, increasing healthy fats, limiting salt (sodium) intake, and making other diet and lifestyle changes. What is my plan? Your health care provider may recommend that: You limit your fat intake to _________% or less of your total calories each day. You limit your saturated fat intake to _________% or less of your total calories each day. You limit the amount of cholesterol in your diet to less than _________ mg per day. You limit the amount of sodium in your diet to less than _________ mg per day. What are tips for following this plan? Cooking Cook foods using methods other than frying. Baking, boiling, grilling, and broiling are all good options. Other ways to reduce fat include: Removing the skin from poultry. Removing all visible fats from meats. Steaming vegetables in water or broth. Meal planning  At meals, imagine dividing your plate into fourths: Fill one-half of your plate with vegetables and green salads. Fill one-fourth of your plate with whole grains. Fill one-fourth of your plate with lean protein foods. Eat 2-4 cups of vegetables per day. One cup of vegetables equals 1 cup (91 g) broccoli or cauliflower florets, 2 medium carrots, 1 large bell pepper, 1 large sweet potato, 1 large tomato, 1 medium white potato, 2 cups (150 g) raw leafy greens. Eat 1-2 cups of fruit per day. One cup of fruit equals 1 small apple, 1 large banana, 1 cup (237 g) mixed fruit, 1 large orange,  cup (82 g) dried fruit, 1 cup (240 mL) 100% fruit juice. Eat more foods that contain soluble fiber. Examples include apples, broccoli, carrots, beans, peas, and barley. Aim to get 25-30 g of fiber per day. Increase your consumption of legumes, nuts, and seeds to 4-5 servings per week. One serving of dried beans or legumes equals  cup (90 g) cooked, 1 serving of nuts is  oz (12 almonds, 24 pistachios, or 7 walnut halves), and 1 serving of  seeds equals  oz (8 g). Fats Choose healthy fats more often. Choose monounsaturated and polyunsaturated fats, such as olive and canola oils, avocado oil, flaxseeds, walnuts, almonds, and seeds. Eat more omega-3 fats. Choose salmon, mackerel, sardines, tuna, flaxseed oil, and ground flaxseeds. Aim to eat fish at least 2 times each week. Check food labels carefully to identify foods with trans fats or high amounts of saturated fat. Limit saturated fats. These are found in animal products, such as meats, butter, and cream. Plant sources of saturated fats include palm oil, palm kernel oil, and coconut oil. Avoid foods with partially hydrogenated oils in them. These contain trans fats. Examples are stick margarine, some tub margarines, cookies, crackers, and other baked goods. Avoid fried foods. General information Eat more home-cooked food and less restaurant, buffet, and fast food. Limit or avoid alcohol. Limit foods that are high in added sugar and simple starches such as foods made using white refined flour (white breads, pastries, sweets). Lose weight if you are overweight. Losing just 5-10% of your body weight can help your overall health and prevent diseases such as diabetes and heart disease. Monitor your sodium intake, especially if you have high blood pressure. Talk with your health care provider about your sodium intake. Try to incorporate more vegetarian meals weekly. What foods should I eat? Fruits All fresh, canned (in natural juice), or frozen fruits. Vegetables Fresh or frozen vegetables (raw, steamed, roasted, or  grilled). Green salads. Grains Most grains. Choose whole wheat and whole grains most of the time. Rice and pasta, including brown rice and pastas made with whole wheat. Meats and other proteins Lean, well-trimmed beef, veal, pork, and lamb. Chicken and [place] without skin. All fish and shellfish. Wild duck, rabbit, pheasant, and venison. Egg whites or low-cholesterol egg  substitutes. Dried beans, peas, lentils, and tofu. Seeds and most nuts. Dairy Low-fat or nonfat cheeses, including ricotta and mozzarella. Skim or 1% milk (liquid, powdered, or evaporated). Buttermilk made with low-fat milk. Nonfat or low-fat yogurt. Fats and oils Non-hydrogenated (trans-free) margarines. Vegetable oils, including soybean, sesame, sunflower, olive, avocado, peanut, safflower, corn, canola, and cottonseed. Salad dressings or mayonnaise made with a vegetable oil. Beverages Water (mineral or sparkling). Coffee and tea. Unsweetened ice tea. Diet beverages. Sweets and desserts Sherbet, gelatin, and fruit ice. Small amounts of dark chocolate. Limit all sweets and desserts. Seasonings and condiments All seasonings and condiments. The items listed above may not be a complete list of foods and beverages you can eat. Contact a dietitian for more options. What foods should I avoid? Fruits Canned fruit in heavy syrup. Fruit in cream or butter sauce. Fried fruit. Limit coconut. Vegetables Vegetables cooked in cheese, cream, or butter sauce. Fried vegetables. Grains Breads made with saturated or trans fats, oils, or whole milk. Croissants. Sweet rolls. Donuts. High-fat crackers, such as cheese crackers and chips. Meats and other proteins Fatty meats, such as hot dogs, ribs, sausage, bacon, rib-eye roast or steak. High-fat deli meats, such as salami and bologna. Caviar. Domestic duck and goose. Organ meats, such as liver. Dairy Cream, sour cream, cream cheese, and creamed cottage cheese. Whole-milk cheeses. Whole or 2% milk (liquid, evaporated, or condensed). Whole buttermilk. Cream sauce or high-fat cheese sauce. Whole-milk yogurt. Fats and oils Meat fat, or shortening. Cocoa butter, hydrogenated oils, palm oil, coconut oil, palm kernel oil. Solid fats and shortenings, including bacon fat, salt pork, lard, and butter. Nondairy cream substitutes. Salad dressings with cheese or sour  cream. Beverages Regular sodas and any drinks with added sugar. Sweets and desserts Frosting. Pudding. Cookies. Cakes. Pies. Milk chocolate or white chocolate. Buttered syrups. Full-fat ice cream or ice cream drinks. The items listed above may not be a complete list of foods and beverages to avoid. Contact a dietitian for more information. Summary Heart-healthy meal planning includes limiting unhealthy fats, increasing healthy fats, limiting salt (sodium) intake and making other diet and lifestyle changes. Lose weight if you are overweight. Losing just 5-10% of your body weight can help your overall health and prevent diseases such as diabetes and heart disease. Focus on eating a balance of foods, including fruits and vegetables, low-fat or nonfat dairy, lean protein, nuts and legumes, whole grains, and heart-healthy oils and fats. This information is not intended to replace advice given to you by your health care provider. Make sure you discuss any questions you have with your health care provider. Document Revised: 07/24/2021 Document Reviewed: 07/24/2021 Elsevier Patient Education  2023 [employer].

## 2022-10-26 NOTE — Progress Notes (Signed)
Office Visit    Patient Name: Melinda [name] Date of Encounter: 10/26/2022  PCP:  Lorre Munroe, NP   [facility]  Cardiologist:  Donato Schultz, MD  Advanced Practice Provider:  No care team member to display Electrophysiologist:  None   HPI    Melinda [name] is a 67 y.o. female with a past medical history of CAD, HLD, DM, carotid disease, aortic atherosclerosis, CKD, OSA, WPW syndrome, orthostatic hypotension, and fibromyalgia presents today for follow-up.  She was seen 01/18/2022 and continued to have severe episodes of orthostasis.  Her midodrine was increased to 3 times daily at that time.  She continued on Florinef 0.1 mg daily.  She was last seen 05/02/2022 and at that time she was overall doing well.  She had a few episodes of orthostasis but overall this had improved significantly.  She reported episodes of chest pain which radiated into her neck.  She felt that this was related to indigestion.  The pain occurred randomly with no clear exertional triggers.  Patient denied chest pain/pressure, dyspnea, PND, orthopnea, leg edema.  Denied dizziness or lightheadedness, syncope or presyncope.  She stated she been weaning off of her Percocet.  She reports that she has been on narcotics since 1994 but would like to be off them completely.  She was followed by pain management.  Today,she states that moments after rising as she gets lightheaded but this is significantly improved.  Now that she is in her 1s she wants to find her passion and her passion is underwater.  Monday she has been off her midodrine.  She has been to the [facility] and is up to swimming 44 laps in the pool underwater with a snorkel.  After that she was able to walk a mile.  She is lost over 30 pounds which I congratulated her on.  She still does struggle with a lack of energy at times.  She states that every dives she will be with someone once she is certified.  She really hopes that Dr. Anne Fu will allow  her to participate in diving lessons.  Reports no shortness of breath nor dyspnea on exertion. Reports no chest pain, pressure, or tightness. No edema, orthopnea, PND. Reports no palpitations.   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 b.) Doppler 06/28/2015: 40-59% BILATERAL ICA. c.) Dopplers 04/23/2019, 04/22/2020, 10/20/2021: 40-59% RICA and 1-39% LICA.   Blue toes    2nd toe on right foot, will get appt.   Bulging disc    CAD (coronary artery disease) 2009   a.) PCI with placement of a 15 x 30 mm Promus DES to mLAD. b.) LHC 02/27/2011: EF 60%; no sig CAD; mild lum irreg at ostium of diagnoal (jailed by stent).   Chicken pox    Chronic fatigue    Chronic, continuous use of opioids    CKD (chronic kidney disease), stage III (HCC)    COVID 02/22/2022   Degenerative disc disease    Depression    Diverticulosis    Facet joint disease    Failed cervical fusion    Fibromyalgia    Hiatal hernia  Hyperlipidemia    IBS (irritable bowel syndrome)    MCL deficiency, knee    MRSA (methicillin resistant staph aureus) culture positive 2011   GREAT TOE RIGHT FOOT   Neuropathy 04/18/2010   Orthostatic hypotension 01/2020   a.) multiple pre-syncopal episodes; takes proamatine   OSA on CPAP 08/17/2003   Osteoarthritis    a.) back, neck, hands, knees   Restless leg syndrome    Sepsis (HCC) 09/2013   Spinal headache 11/07/2021   Spinal stenosis    T2DM (type 2 diabetes mellitus) (HCC)    Wolff-Parkinson-White (WPW) syndrome 1992   a.) s/p ablations; unsuccessful. Now has rare episodes   Past Surgical History:  Procedure Laterality Date   AMPUTATION TOE Right  02/01/2017   Procedure: AMPUTATION TOE-RIGHT 2ND MPJ;  Surgeon: Recardo Evangelist, DPM;  Location: [facility] ORS;  Service: Podiatry;  Laterality: Right;   ANTERIOR CERVICAL DECOMP/DISCECTOMY FUSION N/A 07/28/2014   Procedure: ANTERIOR CERVICAL DECOMPRESSION/DISCECTOMY FUSION CERVICAL 3-4,4-5,5-6 LEVELS WITH INSTRUMENTATION AND ALLOGRAFT;  Surgeon: Emilee Hero, MD;  Location: [facility] OR;  Service: Orthopedics;  Laterality: N/A;  Anterior cervical decompression fusion, cervical 3-4, cervical 4-5, cervical 5-6 with instrumentation and allograft   BACK SURGERY     X 3 1979, 1994, 1995   BLEPHAROPLASTY Bilateral 2013   CARDIAC ELECTROPHYSIOLOGY STUDY AND ABLATION N/A 1992   CARPAL TUNNEL RELEASE Right    CHOLECYSTECTOMY  2003   COLONOSCOPY WITH PROPOFOL N/A 02/22/2022   Procedure: COLONOSCOPY WITH PROPOFOL;  Surgeon: Wyline Mood, MD;  Location: [facility] ENDOSCOPY;  Service: Gastroenterology;  Laterality: N/A;   CORONARY ANGIOPLASTY WITH STENT PLACEMENT N/A 2009   Procedure: CORONARY ANGIOPLASTY WITH STENT PLACEMENT   ENDOMETRIAL ABLATION  2007   EXCISION PARTIAL PHALANX Left 09/28/2021   Procedure: EXCISION PARTIAL PHALANX - FIRST;  Surgeon: Rosetta Posner, DPM;  Location: [facility];  Service: Podiatry;  Laterality: Left;   GALLBLADDER SURGERY     HAMMER TOE SURGERY Right 10/17/2017   Procedure: HAMMER TOE CORRECTION-4TH TOE;  Surgeon: Recardo Evangelist, DPM;  Location: [facility];  Service: Podiatry;  Laterality: Right;  LMA- WITH LOCAL Diabetic - diet controlled   HAND SURGERY Left    INFUSION PUMP IMPLANTATION     X2 with morphine and baclofen   INTRATHECAL PUMP REVISION N/A 04/25/2018   Procedure: Intrathecal pump replacement;  Surgeon: Odette Fraction, MD;  Location: [facility] OR;  Service: Neurosurgery;  Laterality: N/A;  right   INTRATHECAL PUMP REVISION Right 04/25/2018   INTRATHECAL PUMP REVISION N/A 12/12/2018   Procedure: Intrathecal pump revision with exploration of pocket;   Surgeon: Odette Fraction, MD;  Location[facility] OR;  Service: Neurosurgery;  Laterality: N/A;  Intrathecal pump revision with exploration of pocket   INTRATHECAL PUMP REVISION N/A 03/11/2020   Procedure: INTRATHECAL PUMP REVISION;  Surgeon: Delano Metz, MD;  Location: [facility] ORS;  Service: Neurosurgery;  Laterality: N/A;   INTRATHECAL PUMP REVISION Right 12/14/2021   Procedure: INTRATHECAL PUMP REMOVAL;  Surgeon: Delano Metz, MD;  Location: [facility] ORS;  Service: Neurosurgery;  Laterality: Right;   IRRIGATION AND DEBRIDEMENT FOOT Right 02/23/2017   Procedure: IRRIGATION AND DEBRIDEMENT FOOT-right foot;  Surgeon: Gwyneth Revels, DPM;  Location: [facility] ORS;  Service: Podiatry;  Laterality: Right;   KNEE SURGERY Right 05/24/2020   LEFT HEART CATH AND CORONARY ANGIOGRAPHY Left 02/27/2011   Procedure: LEFT HEART CATH AND CORONARY ANGIOGRAPHY   PAIN PUMP REVISION N/A 08/15/2018   Procedure: Intrathecal PUMP REVISION;  Surgeon: Odette Fraction, MD;  Location: [facility] OR;  Service: Neurosurgery;  Laterality: N/A;  INTRATHECAL PUMP REVISION   PLANTAR FASCIA SURGERY Bilateral    TOE AMPUTATION Right    RIGHT great toe (1st digit)   TOE AMPUTATION Right    a.) RIGHT second toe (2nd digit)   TOE SURGERY     then revision 8/18   TOTAL KNEE ARTHROPLASTY Right 05/24/2020   Procedure: RIGHT TOTAL KNEE ARTHROPLASTY;  Surgeon: Marcene Corning, MD;  Location: [facility] ORS;  Service: Orthopedics;  Laterality: Right;   TOTAL KNEE REVISION Right 09/06/2020   : RIGHT KNEE POLY REVISION;  Marcene Corning, MD) PLAN TO DISCHARGE FROM PACU   WOUND DEBRIDEMENT Left 09/28/2021   Procedure: DEBRIDEMENT SKIN, SUBCUTANEOUS TISSUE - 11042;  Surgeon: Rosetta Posner, DPM;  Location: [facility];  Service: Podiatry;  Laterality: Left;  Diabetic    Allergies  No Known Allergies   EKGs/Labs/Other Studies Reviewed:   The following studies were reviewed today: Cardiac Studies & Procedures     STRESS TESTS  MYOCARDIAL  PERFUSION IMAGING 10/12/2021  Narrative   Findings are consistent with no prior ischemia. The study is low risk.   No ST deviation was noted.   Left ventricular function is normal. Nuclear stress EF: 69 %. The left ventricular ejection fraction is hyperdynamic (>65%). End diastolic cavity size is normal.   Prior study available for comparison from 06/04/2019.  Findings: Negative for stress induced arrhythmias. No evidence of ischemia. Small apical perfusion defect in rest and stress with normal wall motion likely related to apical thinning.  Conclusions: Negative stress test. Low risk study. No significant change from prior.   ECHOCARDIOGRAM  ECHOCARDIOGRAM COMPLETE 04/22/2019  Narrative ECHOCARDIOGRAM REPORT    Patient Name:   CHUNDRA SAUERWEIN Date of Exam: 04/22/2019 Medical Rec #:  [id number]     Height:       70.0 in Accession #:    [id number]    Weight:       217.0 lb Date of Birth:  Oct 28, 1955     BSA:          2.16 m Patient Age:    63 years      BP:           110/54 mmHg Patient Gender: F             HR:           66 bpm. Exam Location:  [address]  Procedure: 2D Echo, Cardiac Doppler and Color Doppler  Indications:    I25.10 CAD.  History:        Patient has prior history of Echocardiogram examinations, most recent 09/12/2015. CAD Risk Factors:Hypertension, Dyslipidemia, Sleep Apnea, Diabetes and Former Smoker. CKD stage 3.  Sonographer:    Garald Braver, RDCS Referring Phys: [id number] LORI C [place]  IMPRESSIONS   1. Left ventricular ejection fraction, by visual estimation, is 60 to 65%. The left ventricle has normal function. Normal left ventricular size. There is no left ventricular hypertrophy. Normal wall motion. Normal diastolic function. 2. Global right ventricle has normal systolic function.The right ventricular size is normal. No increase in right ventricular wall thickness. 3. Left atrial size was normal. 4. Right atrial size was normal. 5. The mitral  valve is normal in structure. No evidence of mitral valve regurgitation. No evidence of mitral stenosis. 6. The tricuspid valve is normal in structure. Tricuspid valve regurgitation is trivial. 7. The aortic valve is tricuspid Aortic valve regurgitation was not visualized by color flow Doppler. Structurally normal aortic valve,  with no evidence of sclerosis or stenosis. 8. The tricuspid regurgitant velocity is 2.59 m/s, and with an assumed right atrial pressure of 8 mmHg, the estimated right ventricular systolic pressure is mildly elevated at 34.8 mmHg. 9. The inferior vena cava is normal in size with greater than 50% respiratory variability, suggesting right atrial pressure of 3 mmHg.  In comparison to the previous echocardiogram(s): 09/12/15 EF 65-70%. FINDINGS Left Ventricle: Left ventricular ejection fraction, by visual estimation, is 60 to 65%. The left ventricle has normal function. No evidence of left ventricular regional wall motion abnormalities. There is no left ventricular hypertrophy. Normal left ventricular size. Spectral Doppler shows Left ventricular diastolic parameters were normal pattern of LV diastolic filling.  Right Ventricle: The right ventricular size is normal. No increase in right ventricular wall thickness. Global RV systolic function is has normal systolic function. The tricuspid regurgitant velocity is 2.59 m/s, and with an assumed right atrial pressure of 8 mmHg, the estimated right ventricular systolic pressure is mildly elevated at 34.8 mmHg.  Left Atrium: Left atrial size was normal in size.  Right Atrium: Right atrial size was normal in size  Pericardium: There is no evidence of pericardial effusion.  Mitral Valve: The mitral valve is normal in structure. No evidence of mitral valve stenosis by observation. No evidence of mitral valve regurgitation.  Tricuspid Valve: The tricuspid valve is normal in structure. Tricuspid valve regurgitation is trivial by color  flow Doppler.  Aortic Valve: The aortic valve is tricuspid. Aortic valve regurgitation was not visualized by color flow Doppler. The aortic valve is structurally normal, with no evidence of sclerosis or stenosis.  Pulmonic Valve: The pulmonic valve was normal in structure. Pulmonic valve regurgitation is not visualized by color flow Doppler.  Aorta: The aortic root is normal in size and structure.  Venous: The inferior vena cava is normal in size with greater than 50% respiratory variability, suggesting right atrial pressure of 3 mmHg.  IAS/Shunts: No atrial level shunt detected by color flow Doppler.    LEFT VENTRICLE PLAX 2D LVIDd:         5.40 cm  Diastology LVIDs:         3.50 cm  LV e' lateral:   7.70 cm/s LV PW:         0.80 cm  LV E/e' lateral: 10.8 LV IVS:        0.90 cm  LV e' medial:    7.87 cm/s LVOT diam:     2.00 cm  LV E/e' medial:  10.5 LV SV:         90 ml LV SV Index:   40.52 LVOT Area:     3.14 cm   RIGHT VENTRICLE RV Basal diam:  2.70 cm RV S prime:     16.30 cm/s TAPSE (M-mode): 2.2 cm RVSP:           34.8 mmHg  LEFT ATRIUM             Index       RIGHT ATRIUM           Index LA diam:        4.70 cm 2.17 cm/m  RA Pressure: 8.00 mmHg LA Vol (A2C):   54.9 ml 25.41 ml/m RA Area:     15.30 cm LA Vol (A4C):   37.5 ml 17.35 ml/m RA Volume:   36.60 ml  16.94 ml/m LA Biplane Vol: 48.6 ml 22.49 ml/m AORTIC VALVE LVOT Vmax:   112.00 cm/s  LVOT Vmean:  79.800 cm/s LVOT VTI:    0.266 m  AORTA Ao Root diam: 3.50 cm Ao Asc diam:  3.50 cm  MITRAL VALVE                        TRICUSPID VALVE TR Peak grad:   26.8 mmHg TR Vmax:        259.00 cm/s MV Decel Time: 225 msec             Estimated RAP:  8.00 mmHg MV E velocity: 82.90 cm/s 103 cm/s  RVSP:           34.8 mmHg MV A velocity: 83.30 cm/s 70.3 cm/s MV E/A ratio:  1.00       1.5       SHUNTS Systemic VTI:  0.27 m Systemic Diam: 2.00 cm   Marca Ancona MD Electronically signed by Marca Ancona  MD Signature Date/Time: 04/22/2019/8:48:06 PM    Final              EKG:  EKG is  ordered today.  The ekg ordered today demonstrates normal sinus rhythm, rate 77 bpm  Recent Labs: 06/11/2022: ALT 22; BUN 21; Creat 1.36; Hemoglobin 14.4; Platelets 229; Potassium 4.7; Sodium 143  Recent Lipid Panel    Component Value Date/Time   CHOL 141 06/11/2022 1431   TRIG 146 06/11/2022 1431   HDL 51 06/11/2022 1431   CHOLHDL 2.8 06/11/2022 1431   VLDL 31.6 03/12/2019 1255   LDLCALC 67 06/11/2022 1431   LDLDIRECT 63.0 11/04/2017 1326    Home Medications   Current Meds  Medication Sig   ACCU-CHEK GUIDE test strip USE 1 EACH BY OTHER ROUTE 3 (THREE) TIMES DAILY. USE AS INSTRUCTE   Accu-Chek Softclix Lancets lancets USE TO CHECK BLOOD SUGAR 1 TIME DAILY   albuterol (VENTOLIN HFA) 108 (90 Base) MCG/ACT inhaler Inhale 2 puffs into the lungs every 4 (four) hours as needed for wheezing or shortness of breath.   Ascorbic Acid (VITAMIN C) 1000 MG tablet Take 1,000 mg by mouth daily.   ASPIRIN 81 PO Take 81 mg by mouth daily.   Biotin 16109 MCG TABS Take 10,000 mcg by mouth daily.   Blood Glucose Calibration (ACCU-CHEK GUIDE CONTROL) LIQD 1 each by In Vitro route as needed.   buPROPion (WELLBUTRIN XL) 150 MG 24 hr tablet TAKE 1 TABLET BY MOUTH EVERY DAY   CALCIUM-MAGNESIUM-ZINC PO Take 3 capsules by mouth daily.   CINNAMON PO Take 1,000 mg by mouth daily.   Coenzyme Q10 (CO Q 10 PO) Take 1 tablet by mouth daily.   ezetimibe (ZETIA) 10 MG tablet TAKE 1 TABLET BY MOUTH EVERY DAY   glucosamine-chondroitin 500-400 MG tablet Take 2 tablets by mouth daily.    Insulin Pen Needle 31G X 5 MM MISC BD Pen Needles- brand specific Inject insulin via insulin pen 6 x daily   nitroGLYCERIN (NITROSTAT) 0.4 MG SL tablet PLACE 1 TABLET UNDER THE TONGUE EVERY 5 (FIVE) MINUTES AS NEEDED FOR CHEST PAIN.   Omega-3 Fatty Acids (FISH OIL) 1200 MG CAPS Take 1,200 mg by mouth in the morning.    PARoxetine (PAXIL) 20 MG  tablet TAKE 1/2 TABLET BY MOUTH DAILY IN THE MORNING   pregabalin (LYRICA) 150 MG capsule TAKE 1 CAPSULE BY MOUTH THREE TIMES A DAY   rosuvastatin (CRESTOR) 20 MG tablet TAKE 1 TABLET BY MOUTH EVERY DAY   tirzepatide (MOUNJARO) 2.5 MG/0.5ML Pen Inject 2.5 mg into  the skin once a week.   tiZANidine (ZANAFLEX) 2 MG tablet Take 2 mg by mouth in the morning and at bedtime.   vitamin E 180 MG (400 UNITS) capsule Take 400 Units by mouth daily.   [DISCONTINUED] Magnesium 400 MG CAPS Take 400 mg by mouth at bedtime.   [DISCONTINUED] midodrine (PROAMATINE) 5 MG tablet Take 1 tablet (5 mg total) by mouth 3 (three) times daily with meals.   [DISCONTINUED] tiZANidine (ZANAFLEX) 4 MG tablet TAKE 1 TABLET (4 MG TOTAL) BY MOUTH EVERY 8 (EIGHT) HOURS AS NEEDED FOR MUSCLE SPASMS     Review of Systems      All other systems reviewed and are otherwise negative except as noted above.  Physical Exam    VS:  BP 124/82   Pulse 77   Ht 5\' 9"  (1.753 m)   Wt 199 lb (90.3 kg)   LMP  (LMP Unknown)   SpO2 97%   BMI 29.39 kg/m  , BMI Body mass index is 29.39 kg/m.  Wt Readings from Last 3 Encounters:  10/26/22 199 lb (90.3 kg)  10/12/22 211 lb (95.7 kg)  06/18/22 211 lb 3.2 oz (95.8 kg)     GEN: Well nourished, well developed, in no acute distress. HEENT: normal. Neck: Supple, no JVD, carotid bruits, or masses. Cardiac: RRR, no murmurs, rubs, or gallops. No clubbing, cyanosis, edema.  Radials/PT 2+ and equal bilaterally.  Respiratory:  Respirations regular and unlabored, clear to auscultation bilaterally. GI: Soft, nontender, nondistended. MS: No deformity or atrophy. Skin: Warm and dry, no rash. Neuro:  Strength and sensation are intact. Psych: Normal affect.  Assessment & Plan    CAD -No chest pains or shortness of breath -Continue current medication regimen which includes Zetia 10 mg daily, nitro as needed, fish oil 1200 mg daily, Crestor 20 mg daily, and aspirin 81 mg daily  Hypotensive  orthostasis -Blood pressure is stable today off midodrine, 124/82 -Her symptoms have resolved and she feels much better she is able to increase her activity  Hypercholesterolemia -Her LDL 67, triglycerides are 161 -Encouraged to continue current medication regimen -She will be due for repeat labs March of next year  Carotid artery stenosis bilateral-mild 1-39% -Most recent ultrasound reviewed with the patient where she had mild bilateral carotid artery stenosis -No bruit on exam -Stable         Disposition: Follow up 4-6 months with Donato Schultz, MD or APP.  Signed, Sharlene Dory, PA-C 10/26/2022, 5:11 PM [facility]

## 2022-11-01 ENCOUNTER — Other Ambulatory Visit: Payer: Self-pay | Admitting: Internal Medicine

## 2022-11-01 NOTE — Telephone Encounter (Signed)
Requested Prescriptions  Pending Prescriptions Disp Refills   PARoxetine (PAXIL) 20 MG tablet [Pharmacy Med Name: PAROXETINE HCL 20 MG TABLET] 45 tablet 1    Sig: TAKE 1/2 TABLET BY MOUTH DAILY IN THE MORNING     Psychiatry:  Antidepressants - SSRI Passed - 11/01/2022  1:53 AM      Passed - Completed PHQ-2 or PHQ-9 in the last 360 days      Passed - Valid encounter within last 6 months    Recent Outpatient Visits           4 months ago Type 2 diabetes mellitus with diabetic mononeuropathy, without long-term current use of insulin [facility])   [facility] [facility] [place], Salvadore Oxford, NP   9 months ago Screening for colon cancer   [facility] [place], Salvadore Oxford, NP   10 months ago Opioid-induced constipation (OIC)   [facility] [place], Salvadore Oxford, NP   1 year ago Preop general physical exam   [place] [facility] [place], Oswaldo Conroy, [place]   1 year ago Orthostasis   [place] [facility] [place], Salvadore Oxford, NP       Future Appointments             In 3 months Baity, Salvadore Oxford, NP [facility], [facility]   In 3 months Anne Fu, Veverly [place], MD [facility]

## 2022-11-05 ENCOUNTER — Encounter: Payer: Self-pay | Admitting: Cardiology

## 2022-11-05 ENCOUNTER — Ambulatory Visit (INDEPENDENT_AMBULATORY_CARE_PROVIDER_SITE_OTHER): Payer: 59 | Admitting: Sports Medicine

## 2022-11-05 ENCOUNTER — Telehealth: Payer: Self-pay | Admitting: *Deleted

## 2022-11-05 VITALS — BP 124/82 | HR 77 | Ht 69.0 in | Wt 198.0 lb

## 2022-11-05 DIAGNOSIS — M25562 Pain in left knee: Secondary | ICD-10-CM | POA: Diagnosis not present

## 2022-11-05 DIAGNOSIS — M7712 Lateral epicondylitis, left elbow: Secondary | ICD-10-CM

## 2022-11-05 DIAGNOSIS — G8929 Other chronic pain: Secondary | ICD-10-CM

## 2022-11-05 MED ORDER — MELOXICAM 15 MG PO TABS
15.0000 mg | ORAL_TABLET | Freq: Every day | ORAL | 0 refills | Status: DC
Start: 2022-11-05 — End: 2023-02-18

## 2022-11-05 NOTE — Patient Instructions (Addendum)
Good to see you  - Start meloxicam 15 mg daily x2 weeks.  If still having pain after 2 weeks, complete 3rd-week of meloxicam. May use remaining meloxicam as needed once daily for pain control.  Do not to use additional NSAIDs while taking meloxicam.  May use Tylenol [id number] mg 2 to 3 times a day for breakthrough pain. Elbow HEP  Recommend getting a tennis elbow strap  Voltaren gel over areas of pain 4 week follow up

## 2022-11-05 NOTE — Progress Notes (Signed)
Melinda [name] [name] [address] [place] [address] Phone: [phone]   Assessment and Plan:     1. Left lateral epicondylitis -Acute, uncomplicated, initial visit - Most consistent with left lateral epicondylitis likely from patient's increased physical activity and swimming activities - Start meloxicam 15 mg daily x2 weeks.  If still having pain after 2 weeks, complete 3rd-week of meloxicam. May use remaining meloxicam as needed once daily for pain control.  Do not to use additional NSAIDs while taking meloxicam.  May use Tylenol [phone] mg 2 to 3 times a day for breakthrough pain. - Recommend using tennis elbow strap when swelling - Start HEP for tennis elbow  2. Chronic pain of left knee -Chronic with exacerbation, initial sports medicine visit - Suspect flare of osteoarthritis - Start meloxicam 15 mg daily x2 weeks.  If still having pain after 2 weeks, complete 3rd-week of meloxicam. May use remaining meloxicam as needed once daily for pain control.  Do not to use additional NSAIDs while taking meloxicam.  May use Tylenol [phone] mg 2 to 3 times a day for breakthrough pain. - Start HEP for knee  Other orders - meloxicam (MOBIC) 15 MG tablet; Take 1 tablet (15 mg total) by mouth daily.    Pertinent previous records reviewed include none   Follow Up: 4 weeks for reevaluation.  If no improvement or worsening of symptoms in left knee, would obtain x-ray and could consider CSI.  If not improving right shoulder pain, could consider right subacromial CSI.  If not improving elbow pain, could consider physical therapy versus bracing   Subjective:   I, Melinda [name], am serving as a Neurosurgeon for Doctor Richardean Sale   Chief Complaint: left sided back and shoulder pain    HPI:    04/17/22 Patient is a 67 year old female complaining of left sided back and shoulder pain. Patient states chiro used to described it as a rib head that  was out of alignment , if she sits with her arms crossed she will have back pain due the back of the chair not being high enough when she has pressure on it she has a little bit of relief, is using CBD , recently took her pain pump out , raising her left arm like trying to drive is painful, tue the wrist over is also painful , hard time getting comfortable when she is gong to sleep, thinks when she was using her using her push mower is when she aggravated it , a week ago , does not want pain meds , Maybe OMT    11/05/2022 Patient states left elbow pain and knee pain is really bad , she has been swimming a lot more , states she has a weird thing happening in her arm starts in the elbow and down to the wrist, she endorses a painful pop and the pain last for a good minute, she will be using her arm to bring a drink or something to her face and her arm will just fall , tylenol for the pain and that helps with the combination of CBD , when she is swimming she can't turn her neck because it is pain , she does a breaststroke and she say she can hear a scrunch noise      Relevant Historical Information: Chronic back pain, lumbar DDD requiring intrathecal pain pump which was removed earlier this year.  DM type II    Additional pertinent review of  systems negative.   Current Outpatient Medications:    ACCU-CHEK GUIDE test strip, USE 1 EACH BY OTHER ROUTE 3 (THREE) TIMES DAILY. USE AS INSTRUCTE, Disp: 200 strip, Rfl: 1   Accu-Chek Softclix Lancets lancets, USE TO CHECK BLOOD SUGAR 1 TIME DAILY, Disp: 100 each, Rfl: 2   albuterol (VENTOLIN HFA) 108 (90 Base) MCG/ACT inhaler, Inhale 2 puffs into the lungs every 4 (four) hours as needed for wheezing or shortness of breath., Disp: 1 each, Rfl: 0   Ascorbic Acid (VITAMIN C) 1000 MG tablet, Take 1,000 mg by mouth daily., Disp: , Rfl:    ASPIRIN 81 PO, Take 81 mg by mouth daily., Disp: , Rfl:    Biotin 40981 MCG TABS, Take 10,000 mcg by mouth daily., Disp: , Rfl:     Blood Glucose Calibration (ACCU-CHEK GUIDE CONTROL) LIQD, 1 each by In Vitro route as needed., Disp: 1 each, Rfl: 1   buPROPion (WELLBUTRIN XL) 150 MG 24 hr tablet, TAKE 1 TABLET BY MOUTH EVERY DAY, Disp: 90 tablet, Rfl: 0   CALCIUM-MAGNESIUM-ZINC PO, Take 3 capsules by mouth daily., Disp: , Rfl:    CINNAMON PO, Take 1,000 mg by mouth daily., Disp: , Rfl:    Coenzyme Q10 (CO Q 10 PO), Take 1 tablet by mouth daily., Disp: , Rfl:    ezetimibe (ZETIA) 10 MG tablet, TAKE 1 TABLET BY MOUTH EVERY DAY, Disp: 90 tablet, Rfl: 2   glucosamine-chondroitin 500-400 MG tablet, Take 2 tablets by mouth daily. , Disp: , Rfl:    Insulin Pen Needle 31G X 5 MM MISC, BD Pen Needles- brand specific Inject insulin via insulin pen 6 x daily, Disp: 100 each, Rfl: 2   meloxicam (MOBIC) 15 MG tablet, Take 1 tablet (15 mg total) by mouth daily., Disp: 30 tablet, Rfl: 0   nitroGLYCERIN (NITROSTAT) 0.4 MG SL tablet, PLACE 1 TABLET UNDER THE TONGUE EVERY 5 (FIVE) MINUTES AS NEEDED FOR CHEST PAIN., Disp: 25 tablet, Rfl: 4   Omega-3 Fatty Acids (FISH OIL) 1200 MG CAPS, Take 1,200 mg by mouth in the morning. , Disp: , Rfl:    PARoxetine (PAXIL) 20 MG tablet, TAKE 1/2 TABLET BY MOUTH DAILY IN THE MORNING, Disp: 45 tablet, Rfl: 1   pregabalin (LYRICA) 150 MG capsule, TAKE 1 CAPSULE BY MOUTH THREE TIMES A DAY, Disp: 270 capsule, Rfl: 0   rosuvastatin (CRESTOR) 20 MG tablet, TAKE 1 TABLET BY MOUTH EVERY DAY, Disp: 90 tablet, Rfl: 2   tirzepatide (MOUNJARO) 2.5 MG/0.5ML Pen, Inject 2.5 mg into the skin once a week., Disp: 6 mL, Rfl: 0   tiZANidine (ZANAFLEX) 2 MG tablet, Take 2 mg by mouth in the morning and at bedtime., Disp: , Rfl:    vitamin E 180 MG (400 UNITS) capsule, Take 400 Units by mouth daily., Disp: , Rfl:    Objective:     Vitals:   11/05/22 1544  BP: 124/82  Pulse: 77  SpO2: 98%  Weight: 198 lb (89.8 kg)  Height: 5\' 9"  (1.753 m)      Body mass index is 29.24 kg/m.    Physical Exam:    General: Appears well,  no acute distress, nontoxic and pleasant Neck: FROM, no pain Neuro: sensation is intact distally with no deficits, strenghth is 5/5 in elbow flexors/extenders/supinator/pronators and wrist flexors/extensors Psych: no evidence of anxiety or depression  Left elbow: No deformity, swelling or muscle wasting Normal Carrying angle ROM:0-140, supination and pronation 90 TTP lateral epicondyle, supinator  NTTP over triceps, ticeps  tendon, olecronon,  medial epicondyle, antecubital fossa, biceps tendon,   pronator Negative tinnels over cubital tunnel   pain with resisted wrist and middle digit extension No pain with resisted wrist flexion   pain with resisted supination No pain with resisted pronation Negative valgus stress Negative varus stress Negative milking maneuver    Electronically signed by:  Melinda [name] [name] 4:40 PM 11/05/22

## 2022-11-05 NOTE — Telephone Encounter (Signed)
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  She will bring it into the office to be completed.

## 2022-11-05 NOTE — Telephone Encounter (Signed)
Attempted to contact pt to discuss paperwork she uploaded regarding clearance to scuba dive.  Documents scanned into MyChart are dark and difficult to read.  Left message for pt, Dr Anne Fu would like to give her a letter of approval and needs to make sure this would be acceptable for pt.  If not, she will need to bring a copy of  the original paperwork to be completed and signed.

## 2022-11-08 NOTE — Telephone Encounter (Signed)
Paperwork has been completed and signed.  Message sent to pt via Mychart it is ready for pick up.

## 2022-11-23 ENCOUNTER — Other Ambulatory Visit: Payer: Self-pay | Admitting: Internal Medicine

## 2022-11-23 NOTE — Telephone Encounter (Signed)
Requested Prescriptions  Pending Prescriptions Disp Refills   MOUNJARO 2.5 MG/0.5ML Pen [Pharmacy Med Name: MOUNJARO 2.5 MG/0.5 ML PEN] 6 mL 0    Sig: INJECT 2.5 MG SUBCUTANEOUSLY WEEKLY     Off-Protocol Failed - 11/23/2022  2:31 AM      Failed - Medication not assigned to a protocol, review manually.      Passed - Valid encounter within last 12 months    Recent Outpatient Visits           5 months ago Type 2 diabetes mellitus with diabetic mononeuropathy, without long-term current use of insulin [facility])   [place] [facility] [place], Salvadore Oxford, NP   10 months ago Screening for colon cancer   [place] [facility] [place], Salvadore Oxford, NP   10 months ago Opioid-induced constipation (OIC)   [place] [facility], Salvadore Oxford, NP   1 year ago Preop general physical exam   [place] [facility] [place], Oswaldo Conroy, [place]   1 year ago Orthostasis   [place] [facility] [place], Salvadore Oxford, NP       Future Appointments             In 1 week Richardean Sale, DO [facility] [place] [facility] [place]   In 2 months [place], Salvadore Oxford, NP [place] [facility] [place], [facility]   In 2 months Anne Fu, Veverly Fells, MD [facility], [facility]

## 2022-11-27 NOTE — Progress Notes (Deleted)
Melinda [name] D.[name] [address] [place] [address] Phone: [phone]   Assessment and Plan:     There are no diagnoses linked to this encounter.  ***   Pertinent previous records reviewed include ***   Follow Up: ***     Subjective:   I, Melinda [name], am serving as a Neurosurgeon for Doctor Richardean Sale   Chief Complaint: left sided back and shoulder pain    HPI:    04/17/22 Patient is a 67 year old female complaining of left sided back and shoulder pain. Patient states chiro used to described it as a rib head that was out of alignment , if she sits with her arms crossed she will have back pain due the back of the chair not being high enough when she has pressure on it she has a little bit of relief, is using CBD , recently took her pain pump out , raising her left arm like trying to drive is painful, tue the wrist over is also painful , hard time getting comfortable when she is gong to sleep, thinks when she was using her using her push mower is when she aggravated it , a week ago , does not want pain meds , Maybe OMT     11/05/2022 Patient states left elbow pain and knee pain is really bad , she has been swimming a lot more , states she has a weird thing happening in her arm starts in the elbow and down to the wrist, she endorses a painful pop and the pain last for a good minute, she will be using her arm to bring a drink or something to her face and her arm will just fall , tylenol for the pain and that helps with the combination of CBD , when she is swimming she can't turn her neck because it is pain , she does a breaststroke and she say she can hear a scrunch noise    12/03/2022 Patient states    Relevant Historical Information: Chronic back pain, lumbar DDD requiring intrathecal pain pump which was removed earlier this year.  DM type II  Additional pertinent review of systems negative.   Current Outpatient Medications:     ACCU-CHEK GUIDE test strip, USE 1 EACH BY OTHER ROUTE 3 (THREE) TIMES DAILY. USE AS INSTRUCTE, Disp: 200 strip, Rfl: 1   Accu-Chek Softclix Lancets lancets, USE TO CHECK BLOOD SUGAR 1 TIME DAILY, Disp: 100 each, Rfl: 2   albuterol (VENTOLIN HFA) 108 (90 Base) MCG/ACT inhaler, Inhale 2 puffs into the lungs every 4 (four) hours as needed for wheezing or shortness of breath., Disp: 1 each, Rfl: 0   Ascorbic Acid (VITAMIN C) 1000 MG tablet, Take 1,000 mg by mouth daily., Disp: , Rfl:    ASPIRIN 81 PO, Take 81 mg by mouth daily., Disp: , Rfl:    Biotin 46962 MCG TABS, Take 10,000 mcg by mouth daily., Disp: , Rfl:    Blood Glucose Calibration (ACCU-CHEK GUIDE CONTROL) LIQD, 1 each by In Vitro route as needed., Disp: 1 each, Rfl: 1   buPROPion (WELLBUTRIN XL) 150 MG 24 hr tablet, TAKE 1 TABLET BY MOUTH EVERY DAY, Disp: 90 tablet, Rfl: 0   CALCIUM-MAGNESIUM-ZINC PO, Take 3 capsules by mouth daily., Disp: , Rfl:    CINNAMON PO, Take 1,000 mg by mouth daily., Disp: , Rfl:    Coenzyme Q10 (CO Q 10 PO), Take 1 tablet by mouth daily., Disp: , Rfl:  ezetimibe (ZETIA) 10 MG tablet, TAKE 1 TABLET BY MOUTH EVERY DAY, Disp: 90 tablet, Rfl: 2   glucosamine-chondroitin 500-400 MG tablet, Take 2 tablets by mouth daily. , Disp: , Rfl:    Insulin Pen Needle 31G X 5 MM MISC, BD Pen Needles- brand specific Inject insulin via insulin pen 6 x daily, Disp: 100 each, Rfl: 2   meloxicam (MOBIC) 15 MG tablet, Take 1 tablet (15 mg total) by mouth daily., Disp: 30 tablet, Rfl: 0   nitroGLYCERIN (NITROSTAT) 0.4 MG SL tablet, PLACE 1 TABLET UNDER THE TONGUE EVERY 5 (FIVE) MINUTES AS NEEDED FOR CHEST PAIN., Disp: 25 tablet, Rfl: 4   Omega-3 Fatty Acids (FISH OIL) 1200 MG CAPS, Take 1,200 mg by mouth in the morning. , Disp: , Rfl:    PARoxetine (PAXIL) 20 MG tablet, TAKE 1/2 TABLET BY MOUTH DAILY IN THE MORNING, Disp: 45 tablet, Rfl: 1   pregabalin (LYRICA) 150 MG capsule, TAKE 1 CAPSULE BY MOUTH THREE TIMES A DAY, Disp: 270  capsule, Rfl: 0   rosuvastatin (CRESTOR) 20 MG tablet, TAKE 1 TABLET BY MOUTH EVERY DAY, Disp: 90 tablet, Rfl: 2   tirzepatide (MOUNJARO) 2.5 MG/0.5ML Pen, INJECT 2.5 MG SUBCUTANEOUSLY WEEKLY, Disp: 6 mL, Rfl: 0   tiZANidine (ZANAFLEX) 2 MG tablet, Take 2 mg by mouth in the morning and at bedtime., Disp: , Rfl:    vitamin E 180 MG (400 UNITS) capsule, Take 400 Units by mouth daily., Disp: , Rfl:    Objective:     There were no vitals filed for this visit.    There is no height or weight on file to calculate BMI.    Physical Exam:    ***   Electronically signed by:  Melinda [name] D.[name] 9:24 AM 11/27/22

## 2022-11-28 DIAGNOSIS — M2041 Other hammer toe(s) (acquired), right foot: Secondary | ICD-10-CM | POA: Diagnosis not present

## 2022-11-28 DIAGNOSIS — L97521 Non-pressure chronic ulcer of other part of left foot limited to breakdown of skin: Secondary | ICD-10-CM | POA: Diagnosis not present

## 2022-11-28 DIAGNOSIS — L84 Corns and callosities: Secondary | ICD-10-CM | POA: Diagnosis not present

## 2022-11-28 DIAGNOSIS — L603 Nail dystrophy: Secondary | ICD-10-CM | POA: Diagnosis not present

## 2022-11-28 DIAGNOSIS — M216X2 Other acquired deformities of left foot: Secondary | ICD-10-CM | POA: Diagnosis not present

## 2022-11-28 DIAGNOSIS — G894 Chronic pain syndrome: Secondary | ICD-10-CM | POA: Diagnosis not present

## 2022-11-28 DIAGNOSIS — Z89421 Acquired absence of other right toe(s): Secondary | ICD-10-CM | POA: Diagnosis not present

## 2022-11-28 DIAGNOSIS — E1142 Type 2 diabetes mellitus with diabetic polyneuropathy: Secondary | ICD-10-CM | POA: Diagnosis not present

## 2022-11-28 DIAGNOSIS — M216X1 Other acquired deformities of right foot: Secondary | ICD-10-CM | POA: Diagnosis not present

## 2022-11-28 DIAGNOSIS — M2042 Other hammer toe(s) (acquired), left foot: Secondary | ICD-10-CM | POA: Diagnosis not present

## 2022-12-03 ENCOUNTER — Ambulatory Visit: Payer: 59 | Admitting: Sports Medicine

## 2022-12-26 DIAGNOSIS — L84 Corns and callosities: Secondary | ICD-10-CM | POA: Diagnosis not present

## 2022-12-26 DIAGNOSIS — M216X1 Other acquired deformities of right foot: Secondary | ICD-10-CM | POA: Diagnosis not present

## 2022-12-26 DIAGNOSIS — G894 Chronic pain syndrome: Secondary | ICD-10-CM | POA: Diagnosis not present

## 2022-12-26 DIAGNOSIS — Z89421 Acquired absence of other right toe(s): Secondary | ICD-10-CM | POA: Diagnosis not present

## 2022-12-26 DIAGNOSIS — L97521 Non-pressure chronic ulcer of other part of left foot limited to breakdown of skin: Secondary | ICD-10-CM | POA: Diagnosis not present

## 2022-12-26 DIAGNOSIS — B351 Tinea unguium: Secondary | ICD-10-CM | POA: Diagnosis not present

## 2022-12-26 DIAGNOSIS — L03032 Cellulitis of left toe: Secondary | ICD-10-CM | POA: Diagnosis not present

## 2022-12-26 DIAGNOSIS — L603 Nail dystrophy: Secondary | ICD-10-CM | POA: Diagnosis not present

## 2022-12-26 DIAGNOSIS — M2041 Other hammer toe(s) (acquired), right foot: Secondary | ICD-10-CM | POA: Diagnosis not present

## 2022-12-26 DIAGNOSIS — E1142 Type 2 diabetes mellitus with diabetic polyneuropathy: Secondary | ICD-10-CM | POA: Diagnosis not present

## 2022-12-26 DIAGNOSIS — L600 Ingrowing nail: Secondary | ICD-10-CM | POA: Diagnosis not present

## 2022-12-26 DIAGNOSIS — Q667 Congenital pes cavus, unspecified foot: Secondary | ICD-10-CM | POA: Diagnosis not present

## 2022-12-28 ENCOUNTER — Other Ambulatory Visit: Payer: Self-pay | Admitting: Internal Medicine

## 2022-12-28 DIAGNOSIS — M797 Fibromyalgia: Secondary | ICD-10-CM

## 2022-12-31 NOTE — Telephone Encounter (Signed)
Requested medication (s) are due for refill today -yes  Requested medication (s) are on the active medication list-yes  Future visit scheduled -yes  Last refill: 09/25/22 #270  Notes to clinic: non delegated Rx  Requested Prescriptions  Pending Prescriptions Disp Refills   pregabalin (LYRICA) 150 MG capsule [Pharmacy Med Name: PREGABALIN 150 MG CAPSULE] 270 capsule 0    Sig: TAKE 1 CAPSULE BY MOUTH THREE TIMES A DAY     Not Delegated - Neurology:  Anticonvulsants - Controlled - pregabalin Failed - 12/28/2022 12:56 PM      Failed - This refill cannot be delegated      Failed - Cr in normal range and within 360 days    Creat  Date Value Ref Range Status  06/11/2022 1.36 (H) 0.50 - 1.05 mg/dL Final   Creatinine,U  Date Value Ref Range Status  03/12/2019 131.0 mg/dL Final   Creatinine, Urine  Date Value Ref Range Status  01/23/2022 69 20 - 275 mg/dL Final         Passed - Completed PHQ-2 or PHQ-9 in the last 360 days      Passed - Valid encounter within last 12 months    Recent Outpatient Visits           6 months ago Type 2 diabetes mellitus with diabetic mononeuropathy, without long-term current use of insulin (HCC)   [place] [facility] [place], Salvadore Oxford, NP   11 months ago Screening for colon cancer   [place] [facility] [place], Salvadore Oxford, NP   12 months ago Opioid-induced constipation (OIC)   [place] [facility] [place], Salvadore Oxford, NP   1 year ago Preop general physical exam   [place] [facility], Oswaldo Conroy, [place]   1 year ago Orthostasis   [place] [facility] [place], Salvadore Oxford, NP       Future Appointments             Tomorrow Richardean Sale, DO [facility] [place] [facility] [place]   In 1 month [place], Salvadore Oxford, NP [place] [facility], [facility]   In 1 month Anne Fu, Veverly Fells, MD [facility], [facility]                Requested Prescriptions  Pending Prescriptions Disp Refills   pregabalin (LYRICA) 150 MG capsule [Pharmacy Med Name: PREGABALIN 150 MG CAPSULE] 270 capsule 0    Sig: TAKE 1 CAPSULE BY MOUTH THREE TIMES A DAY     Not Delegated - Neurology:  Anticonvulsants - Controlled - pregabalin Failed - 12/28/2022 12:56 PM      Failed - This refill cannot be delegated      Failed - Cr in normal range and within 360 days    Creat  Date Value Ref Range Status  06/11/2022 1.36 (H) 0.50 - 1.05 mg/dL Final   Creatinine,U  Date Value Ref Range Status  03/12/2019 131.0 mg/dL Final   Creatinine, Urine  Date Value Ref Range Status  01/23/2022 69 20 - 275 mg/dL Final         Passed - Completed PHQ-2 or PHQ-9 in the last 360 days      Passed - Valid encounter within last 12 months    Recent Outpatient Visits           6 months ago Type 2 diabetes mellitus with diabetic mononeuropathy, without  long-term current use of insulin [facility])   [place] [facility] [place], Salvadore Oxford, NP   11 months ago Screening for colon cancer   [place] [facility] [place], Salvadore Oxford, NP   12 months ago Opioid-induced constipation (OIC)   [place] [facility] [place], Salvadore Oxford, NP   1 year ago Preop general physical exam   [place] [facility] [place], Oswaldo Conroy, [place]   1 year ago Orthostasis   [place] [facility] [place], Salvadore Oxford, NP       Future Appointments             Tomorrow Richardean Sale, DO [facility] [place] [facility] at [place]   In 1 month [place], Salvadore Oxford, NP [place] [facility], [facility]   In 1 month Anne Fu, Veverly Fells, MD [facility] at [facility], [facility]

## 2023-01-01 ENCOUNTER — Ambulatory Visit (INDEPENDENT_AMBULATORY_CARE_PROVIDER_SITE_OTHER): Payer: 59 | Admitting: Sports Medicine

## 2023-01-01 VITALS — BP 122/78 | HR 79 | Ht 69.0 in | Wt 195.0 lb

## 2023-01-01 DIAGNOSIS — M7712 Lateral epicondylitis, left elbow: Secondary | ICD-10-CM

## 2023-01-01 NOTE — Patient Instructions (Signed)
Recommend getting a tennis elbow strap to wear when active can be found at [employer] , [employer] or any similar store [place] [facility]  5-6 week follow up

## 2023-01-01 NOTE — Progress Notes (Signed)
Aleen Sells D.[facility] [address] [place] [address] Phone: [phone]   Assessment and Plan:     1. Left lateral epicondylitis  -Chronic with exacerbation, subsequent visit - I still suspect patient is most likely experiencing lateral epicondylitis caused from physical activity based on HPI and physical exam.  Physical exam has improved compared with prior office visit with patient primarily experiencing only tenderness over lateral epicondyles.  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  If patient continues to have symptoms, we would refocus on possible cervical etiology - Restart HEP. Provided - Discontinue meloxicam may use remainder as needed - May use Tylenol as needed for day-to-day pain relief - Recommend using tennis elbow strap when physically active  Pertinent previous records reviewed include C-spine MRI 11/19/2020   Follow Up: 5 to 6 weeks for reevaluation.  If still consistent with lateral epicondylitis, could resuggest physical therapy.  If no improvement or worsening of symptoms, could consider epidural CSI   Subjective:   I, [name], am serving as a Neurosurgeon for Doctor Richardean Sale   Chief Complaint: left sided back and shoulder pain    HPI:    04/17/22 Patient is a 67 year old female complaining of left sided back and shoulder pain. Patient states chiro used to described it as a rib head that was out of alignment , if she sits with her arms crossed she will have back pain due the back of the chair not being high enough when she has pressure on it she has a little bit of relief, is using CBD , recently took her pain pump out , raising her left arm like trying to drive is painful, tue the wrist over is also painful , hard time getting  comfortable when she is gong to sleep, thinks when she was using her using her push mower is when she aggravated it , a week ago , does not want pain meds , Maybe OMT     11/05/2022 Patient states left elbow pain and knee pain is really bad , she has been swimming a lot more , states she has a weird thing happening in her arm starts in the elbow and down to the wrist, she endorses a painful pop and the pain last for a good minute, she will be using her arm to bring a drink or something to her face and her arm will just fall , tylenol for the pain and that helps with the combination of CBD , when she is swimming she can't turn her neck because it is pain , she does a breaststroke and she say she can hear a scrunch noise    01/01/2023 Patient states that she is having the same pain again,  neck pain does states she has a cage in her. States that she will be filing her nails and the left hand will go numb she has left thumb numbness all the time      Relevant Historical Information: Chronic back pain, lumbar DDD requiring intrathecal pain pump which was removed earlier this year.  DM type II  Additional pertinent review of systems negative.   Current Outpatient Medications:    ACCU-CHEK GUIDE test strip, USE 1 EACH BY OTHER ROUTE 3 (THREE) TIMES DAILY. USE AS INSTRUCTE, Disp: 200 strip, Rfl: 1  Accu-Chek Softclix Lancets lancets, USE TO CHECK BLOOD SUGAR 1 TIME DAILY, Disp: 100 each, Rfl: 2   Ascorbic Acid (VITAMIN C) 1000 MG tablet, Take 1,000 mg by mouth daily., Disp: , Rfl:    ASPIRIN 81 PO, Take 81 mg by mouth daily., Disp: , Rfl:    Biotin [address] MCG TABS, Take 10,000 mcg by mouth daily., Disp: , Rfl:    Blood Glucose Calibration (ACCU-CHEK GUIDE CONTROL) LIQD, 1 each by In Vitro route as needed., Disp: 1 each, Rfl: 1   buPROPion (WELLBUTRIN XL) 150 MG 24 hr tablet, TAKE 1 TABLET BY MOUTH EVERY DAY, Disp: 90 tablet, Rfl: 0   CALCIUM-MAGNESIUM-ZINC PO, Take 3 capsules by mouth daily., Disp: , Rfl:     CINNAMON PO, Take 1,000 mg by mouth daily., Disp: , Rfl:    Coenzyme Q10 (CO Q 10 PO), Take 1 tablet by mouth daily., Disp: , Rfl:    ezetimibe (ZETIA) 10 MG tablet, TAKE 1 TABLET BY MOUTH EVERY DAY, Disp: 90 tablet, Rfl: 2   glucosamine-chondroitin 500-400 MG tablet, Take 2 tablets by mouth daily. , Disp: , Rfl:    Insulin Pen Needle 31G X 5 MM MISC, BD Pen Needles- brand specific Inject insulin via insulin pen 6 x daily, Disp: 100 each, Rfl: 2   nitroGLYCERIN (NITROSTAT) 0.4 MG SL tablet, PLACE 1 TABLET UNDER THE TONGUE EVERY 5 (FIVE) MINUTES AS NEEDED FOR CHEST PAIN., Disp: 25 tablet, Rfl: 4   Omega-3 Fatty Acids (FISH OIL) 1200 MG CAPS, Take 1,200 mg by mouth in the morning. , Disp: , Rfl:    PARoxetine (PAXIL) 20 MG tablet, TAKE 1/2 TABLET BY MOUTH DAILY IN THE MORNING, Disp: 45 tablet, Rfl: 1   pregabalin (LYRICA) 150 MG capsule, TAKE 1 CAPSULE BY MOUTH THREE TIMES A DAY, Disp: 270 capsule, Rfl: 0   rosuvastatin (CRESTOR) 20 MG tablet, TAKE 1 TABLET BY MOUTH EVERY DAY, Disp: 90 tablet, Rfl: 2   tirzepatide (MOUNJARO) 2.5 MG/0.5ML Pen, INJECT 2.5 MG SUBCUTANEOUSLY WEEKLY, Disp: 6 mL, Rfl: 0   tiZANidine (ZANAFLEX) 2 MG tablet, Take 2 mg by mouth in the morning and at bedtime., Disp: , Rfl:    vitamin E 180 MG (400 UNITS) capsule, Take 400 Units by mouth daily., Disp: , Rfl:    albuterol (VENTOLIN HFA) 108 (90 Base) MCG/ACT inhaler, Inhale 2 puffs into the lungs every 4 (four) hours as needed for wheezing or shortness of breath., Disp: 1 each, Rfl: 0   meloxicam (MOBIC) 15 MG tablet, Take 1 tablet (15 mg total) by mouth daily., Disp: 30 tablet, Rfl: 0   Objective:     Vitals:   01/01/23 1429  BP: 122/78  Pulse: 79  SpO2: 97%  Weight: 195 lb (88.5 kg)  Height: 5\' 9"  (1.753 m)      Body mass index is 28.8 kg/m.    Physical Exam:    Neck Exam: Cervical Spine- Posture normal Skin- normal, intact  Neuro:  Strength-  Right Left   Deltoid (C5) 5/5 5/5  Bicep/Brachioradialis  (C5/6) 5/5  5/5  Wrist Extension (C6) 5/5 5/5  Tricep (C7) 5/5 5/5  Wrist Flexion (C7) 5/5 5/5  Grip (C8) 5/5 5/5  Finger Abduction (T1) 5/5 5/5   Sensation: intact to light touch in upper extremities bilaterally  Spurling's:  negative bilaterally Neck ROM: Significantly restricted left rotation and sidebending.  Mildly restricted right rotation and sidebending.  Left elbow: No deformity, swelling or muscle wasting Normal Carrying angle ROM:0-140, supination  and pronation 90 TTP lateral epicondyle, supinator  NTTP over triceps, ticeps tendon, olecronon,  medial epicondyle, antecubital fossa, biceps tendon,   pronator Negative tinnels over cubital tunnel no  pain with resisted wrist and middle digit extension No pain with resisted wrist flexion no  pain with resisted supination No pain with resisted pronation Negative valgus stress Negative varus stress Negative milking maneuver   Electronically signed by:  Aleen Sells [facility] 3:04 PM 01/01/23

## 2023-01-02 DIAGNOSIS — L851 Acquired keratosis [keratoderma] palmaris et plantaris: Secondary | ICD-10-CM | POA: Diagnosis not present

## 2023-01-02 DIAGNOSIS — E1142 Type 2 diabetes mellitus with diabetic polyneuropathy: Secondary | ICD-10-CM | POA: Diagnosis not present

## 2023-01-14 ENCOUNTER — Ambulatory Visit: Payer: 59 | Admitting: Sports Medicine

## 2023-01-24 ENCOUNTER — Ambulatory Visit: Payer: Self-pay

## 2023-01-24 ENCOUNTER — Ambulatory Visit
Admission: EM | Admit: 2023-01-24 | Discharge: 2023-01-24 | Disposition: A | Discharge status: Home / Self Care | Payer: 59 | Attending: Emergency Medicine | Admitting: Emergency Medicine

## 2023-01-24 DIAGNOSIS — H6993 Unspecified Eustachian tube disorder, bilateral: Secondary | ICD-10-CM

## 2023-01-24 MED ORDER — FLUTICASONE PROPIONATE 50 MCG/ACT NA SUSP: 2.0000 | Freq: Every day | NASAL | 0 refills | Status: DC

## 2023-01-24 NOTE — Discharge Instructions (Addendum)
Try the Flonase, Claritin or Zyrtec.  I have put an urgent referral to  ear nose and throat, you may see any of the providers if Dr. Jenne Campus is not available.  I would stay out of the water for now.

## 2023-01-24 NOTE — ED Triage Notes (Signed)
Pt states she is having decreased hearing/ fullness and ringing in both ears that started Sunday. Pt states she did go scuba diving 15 feet and right after feels like she has trapped water in her ears. Tried ear drops for swimmers ear with no relief.

## 2023-01-24 NOTE — ED Provider Notes (Signed)
HPI  SUBJECTIVE:  Melinda [name] is a 67 y.o. female who presents with decreased hearing, daily, constant headache, sensation of "cotton" in both of her ears after going [place] quar[place] 4 days ago.  She states that she went 15 feet.  She states that she was able to equalize without any problem.  Symptoms started immediately after diving.  She reports tinnitus, rhinorrhea, sinus pressure, crackling in her ear especially with self insufflation.  She wears ear buds frequently.  No otorrhea, fevers, facial swelling, upper dental pain, vertigo although she feels unbalanced when she tries to clear her ears.  No antibiotics in the past month.  No antipyretic in the past 6 hours.  She has tried over-the-counter swimmer's ear drops, self insufflation without any improvement in her symptoms.  Symptoms are worse when she wears ear buds and tries to clear her ears.  She has a past medical history of coronary artery disease, fibromyalgia, diabetes, chronic kidney disease stage III, TMJ arthralgia, chronic back pain status post multiple back surgeries, fused C-spine x 3.  PCP: [facility].  ENT: None.    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 b.) Doppler 06/28/2015: 40-59% BILATERAL ICA. c.) Dopplers 04/23/2019, 04/22/2020, 10/20/2021: 40-59% RICA and 1-39% LICA.   Blue toes    2nd toe on right foot, will get appt.   Bulging disc    CAD (coronary artery disease) 2009   a.) PCI with placement of a 15 x 30 mm Promus DES to mLAD. b.) LHC 02/27/2011: EF 60%; no sig CAD; mild lum irreg at ostium of diagnoal (jailed by stent).   Chicken pox    Chronic fatigue    Chronic, continuous use of opioids    CKD (chronic  kidney disease), stage III (HCC)    COVID 02/22/2022   Degenerative disc disease    Depression    Diverticulosis    Facet joint disease    Failed cervical fusion    Fibromyalgia    Hiatal hernia    Hyperlipidemia    IBS (irritable bowel syndrome)    MCL deficiency, knee    MRSA (methicillin resistant staph aureus) culture positive 2011   GREAT TOE RIGHT FOOT   Neuropathy 04/18/2010   Orthostatic hypotension 01/2020   a.) multiple pre-syncopal episodes; takes proamatine   OSA on CPAP 08/17/2003   Osteoarthritis    a.) back, neck, hands, knees   Restless leg syndrome    Sepsis (HCC) 09/2013   Spinal headache 11/07/2021   Spinal stenosis    T2DM (type 2 diabetes mellitus) (HCC)    Wolff-Parkinson-White (WPW) syndrome 1992   a.) s/p ablations; unsuccessful. Now has rare episodes    Past Surgical History:  Procedure Laterality Date   AMPUTATION TOE Right 02/01/2017   Procedure: AMPUTATION TOE-RIGHT 2ND MPJ;  Surgeon: Recardo Evangelist, DPM;  Location: [facility] ORS;  Service: Podiatry;  Laterality: Right;   ANTERIOR CERVICAL DECOMP/DISCECTOMY FUSION N/A 07/28/2014   Procedure: ANTERIOR CERVICAL DECOMPRESSION/DISCECTOMY FUSION CERVICAL 3-4,4-5,5-6 LEVELS WITH INSTRUMENTATION AND ALLOGRAFT;  Surgeon: Emilee Hero, MD;  Location: [facility] OR;  Service: Orthopedics;  Laterality: N/A;  Anterior cervical decompression fusion, cervical 3-4, cervical 4-5, cervical 5-6 with  instrumentation and allograft   BACK SURGERY     X 3 1979, 1994, 1995   BLEPHAROPLASTY Bilateral 2013   CARDIAC ELECTROPHYSIOLOGY STUDY AND ABLATION N/A 1992   CARPAL TUNNEL RELEASE Right    CHOLECYSTECTOMY  2003   COLONOSCOPY WITH PROPOFOL N/A 02/22/2022   Procedure: COLONOSCOPY WITH PROPOFOL;  Surgeon: Wyline Mood, MD;  Location: [facility] ENDOSCOPY;  Service: Gastroenterology;  Laterality: N/A;   CORONARY ANGIOPLASTY WITH STENT PLACEMENT N/A 2009   Procedure: CORONARY ANGIOPLASTY WITH STENT PLACEMENT   ENDOMETRIAL  ABLATION  2007   EXCISION PARTIAL PHALANX Left 09/28/2021   Procedure: EXCISION PARTIAL PHALANX - FIRST;  Surgeon: Rosetta Posner, DPM;  Location: [facility];  Service: Podiatry;  Laterality: Left;   GALLBLADDER SURGERY     HAMMER TOE SURGERY Right 10/17/2017   Procedure: HAMMER TOE CORRECTION-4TH TOE;  Surgeon: Recardo Evangelist, DPM;  Location: [facility];  Service: Podiatry;  Laterality: Right;  LMA- WITH LOCAL Diabetic - diet controlled   HAND SURGERY Left    INFUSION PUMP IMPLANTATION     X2 with morphine and baclofen   INTRATHECAL PUMP REVISION N/A 04/25/2018   Procedure: Intrathecal pump replacement;  Surgeon: Odette Fraction, MD;  Location: [facility] OR;  Service: Neurosurgery;  Laterality: N/A;  right   INTRATHECAL PUMP REVISION Right 04/25/2018   INTRATHECAL PUMP REVISION N/A 12/12/2018   Procedure: Intrathecal pump revision with exploration of pocket;  Surgeon: Odette Fraction, MD;  Location: [facility] OR;  Service: Neurosurgery;  Laterality: N/A;  Intrathecal pump revision with exploration of pocket   INTRATHECAL PUMP REVISION N/A 03/11/2020   Procedure: INTRATHECAL PUMP REVISION;  Surgeon: Delano Metz, MD;  Location: [facility] ORS;  Service: Neurosurgery;  Laterality: N/A;   INTRATHECAL PUMP REVISION Right 12/14/2021   Procedure: INTRATHECAL PUMP REMOVAL;  Surgeon: Delano Metz, MD;  Location: [facility] ORS;  Service: Neurosurgery;  Laterality: Right;   IRRIGATION AND DEBRIDEMENT FOOT Right 02/23/2017   Procedure: IRRIGATION AND DEBRIDEMENT FOOT-right foot;  Surgeon: Gwyneth Revels, DPM;  Location: [facility] ORS;  Service: Podiatry;  Laterality: Right;   KNEE SURGERY Right 05/24/2020   LEFT HEART CATH AND CORONARY ANGIOGRAPHY Left 02/27/2011   Procedure: LEFT HEART CATH AND CORONARY ANGIOGRAPHY   PAIN PUMP REVISION N/A 08/15/2018   Procedure: Intrathecal PUMP REVISION;  Surgeon: Odette Fraction, MD;  Location: [facility] OR;  Service: Neurosurgery;  Laterality: N/A;  INTRATHECAL PUMP REVISION    PLANTAR FASCIA SURGERY Bilateral    TOE AMPUTATION Right    RIGHT great toe (1st digit)   TOE AMPUTATION Right    a.) RIGHT second toe (2nd digit)   TOE SURGERY     then revision 8/18   TOTAL KNEE ARTHROPLASTY Right 05/24/2020   Procedure: RIGHT TOTAL KNEE ARTHROPLASTY;  Surgeon: Marcene Corning, MD;  Location: [facility] ORS;  Service: Orthopedics;  Laterality: Right;   TOTAL KNEE REVISION Right 09/06/2020   : RIGHT KNEE POLY REVISION;  Marcene Corning, MD) PLAN TO DISCHARGE FROM PACU   WOUND DEBRIDEMENT Left 09/28/2021   Procedure: DEBRIDEMENT SKIN, SUBCUTANEOUS TISSUE - 11042;  Surgeon: Rosetta Posner, DPM;  Location: [facility];  Service: Podiatry;  Laterality: Left;  Diabetic    Family History  Problem Relation Age of Onset   Cancer Mother    Lung cancer Mother    Arthritis Father    Heart disease Father    Diabetes Father    Dementia Maternal Grandmother    Alcohol abuse Paternal Grandfather    Heart disease Paternal Grandfather  Stroke Paternal Grandfather     Social History   Tobacco Use   Smoking status: Former    Current packs/day: 0.00    Average packs/day: 1.5 packs/day for 32.0 years (48.0 ttl pk-yrs)    Types: Cigarettes    Start date: 07/22/1975    Quit date: 07/22/2007    Years since quitting: 15.5   Smokeless tobacco: Former  Building services engineer status: Never Used  Substance Use Topics   Alcohol use: Not Currently    Comment: occ - Holidays   Drug use: Not Currently    Comment: prescribed pain pump and oxy    No current facility-administered medications for this encounter.  Current Outpatient Medications:    ACCU-CHEK GUIDE test strip, USE 1 EACH BY OTHER ROUTE 3 (THREE) TIMES DAILY. USE AS INSTRUCTE, Disp: 200 strip, Rfl: 1   Accu-Chek Softclix Lancets lancets, USE TO CHECK BLOOD SUGAR 1 TIME DAILY, Disp: 100 each, Rfl: 2   Ascorbic Acid (VITAMIN C) 1000 MG tablet, Take 1,000 mg by mouth daily., Disp: , Rfl:    ASPIRIN 81 PO, Take 81 mg by  mouth daily., Disp: , Rfl:    Biotin 87564 MCG TABS, Take 10,000 mcg by mouth daily., Disp: , Rfl:    Blood Glucose Calibration (ACCU-CHEK GUIDE CONTROL) LIQD, 1 each by In Vitro route as needed., Disp: 1 each, Rfl: 1   buPROPion (WELLBUTRIN XL) 150 MG 24 hr tablet, TAKE 1 TABLET BY MOUTH EVERY DAY, Disp: 90 tablet, Rfl: 0   CALCIUM-MAGNESIUM-ZINC PO, Take 3 capsules by mouth daily., Disp: , Rfl:    CINNAMON PO, Take 1,000 mg by mouth daily., Disp: , Rfl:    Coenzyme Q10 (CO Q 10 PO), Take 1 tablet by mouth daily., Disp: , Rfl:    ezetimibe (ZETIA) 10 MG tablet, TAKE 1 TABLET BY MOUTH EVERY DAY, Disp: 90 tablet, Rfl: 2   fluticasone (FLONASE) 50 MCG/ACT nasal spray, Place 2 sprays into both nostrils daily., Disp: 16 g, Rfl: 0   glucosamine-chondroitin 500-400 MG tablet, Take 2 tablets by mouth daily. , Disp: , Rfl:    Insulin Pen Needle 31G X 5 MM MISC, BD Pen Needles- brand specific Inject insulin via insulin pen 6 x daily, Disp: 100 each, Rfl: 2   Omega-3 Fatty Acids (FISH OIL) 1200 MG CAPS, Take 1,200 mg by mouth in the morning. , Disp: , Rfl:    PARoxetine (PAXIL) 20 MG tablet, TAKE 1/2 TABLET BY MOUTH DAILY IN THE MORNING, Disp: 45 tablet, Rfl: 1   pregabalin (LYRICA) 150 MG capsule, TAKE 1 CAPSULE BY MOUTH THREE TIMES A DAY, Disp: 270 capsule, Rfl: 0   rosuvastatin (CRESTOR) 20 MG tablet, TAKE 1 TABLET BY MOUTH EVERY DAY, Disp: 90 tablet, Rfl: 2   tirzepatide (MOUNJARO) 2.5 MG/0.5ML Pen, INJECT 2.5 MG SUBCUTANEOUSLY WEEKLY, Disp: 6 mL, Rfl: 0   tiZANidine (ZANAFLEX) 2 MG tablet, Take 2 mg by mouth in the morning and at bedtime., Disp: , Rfl:    vitamin E 180 MG (400 UNITS) capsule, Take 400 Units by mouth daily., Disp: , Rfl:    albuterol (VENTOLIN HFA) 108 (90 Base) MCG/ACT inhaler, Inhale 2 puffs into the lungs every 4 (four) hours as needed for wheezing or shortness of breath., Disp: 1 each, Rfl: 0   meloxicam (MOBIC) 15 MG tablet, Take 1 tablet (15 mg total) by mouth daily., Disp: 30  tablet, Rfl: 0   nitroGLYCERIN (NITROSTAT) 0.4 MG SL tablet, PLACE 1 TABLET UNDER THE  TONGUE EVERY 5 (FIVE) MINUTES AS NEEDED FOR CHEST PAIN., Disp: 25 tablet, Rfl: 4  No Known Allergies   ROS  As noted in HPI.   Physical Exam  BP 115/72 (BP Location: Left Arm)   Pulse 68   Temp 98.6 F (37 C) (Oral)   Resp 18   LMP  (LMP Unknown)   SpO2 95%   Constitutional: Well developed, well nourished, no acute distress Eyes:  EOMI, conjunctiva normal bilaterally HENT: Normocephalic, atraumatic,mucus membranes moist positive nasal congestion. Right ear: Right external ear, EAC normal.  TM injected.  TM intact.  No effusion, dullness, bulging.  No pain with traction on pinna, palpation of mastoid.  Tenderness over the tragus.  TMJ nontender.  Positive crepitus. Left ear: Right external ear, EAC normal.  TM injected.  TM intact but dull.  No appreciable effusion, bulging.  No pain with traction on pinna, palpation of tragus or mastoid.  TMJ nontender.  No crepitus. Neck: No cervical lymphadenopathy Respiratory: Normal inspiratory effort Cardiovascular: Normal rate GI: nondistended skin: No rash, skin intact Musculoskeletal: no deformities Neurologic: Alert & oriented x 3, no focal neuro deficits Psychiatric: Speech and behavior appropriate   ED Course   Medications - No data to display  Orders Placed This Encounter  Procedures   Ambulatory referral to ENT    Referral Priority:   Urgent    Referral Type:   Consultation    Referral Reason:   Specialty Services Required    Referred to Provider:   Linus Salmons, MD    Requested Specialty:   Otolaryngology    Number of Visits Requested:   1    No results found for this or any previous visit (from the past 24 hour(s)). No results found.  ED Clinical Impression  1. Dysfunction of both eustachian tubes      ED Assessment/Plan     Suspect patient has eustachian tube dysfunction with some barotrauma.  TMs are intact  however.  No evidence of otitis media or otitis externa.  Will try Flonase, Claritin or Zyrtec.  We decided to not do a steroid taper because of the diabetes.  Will refer to ENT.  Discussed MDM, treatment plan, and plan for follow-up with patient. . patient agrees with plan.   Meds ordered this encounter  Medications   fluticasone (FLONASE) 50 MCG/ACT nasal spray    Sig: Place 2 sprays into both nostrils daily.    Dispense:  16 g    Refill:  0      *This clinic note was created using Scientist, clinical (histocompatibility and immunogenetics). Therefore, there may be occasional mistakes despite careful proofreading.  ?    Domenick Gong, MD 01/26/23 [id number]

## 2023-01-24 NOTE — Telephone Encounter (Signed)
  Pt is calling to report that she was in a [place] this weekend trying to learn how to scuba dive. Pt reports fullness, echoing, ear fullness, with headaches. There are not any available appts at the practice. Pt would like to know what she can do for relief.     Chief Complaint: Bilateral ear fullness after scuba diving, muffled hearing Symptoms: Above Frequency: Sunday Pertinent Negatives: Patient denies fever Disposition: [] ED /[x] Urgent Care (no appt availability in office) / [] Appointment(In office/virtual)/ []  [facility]/ [] Home Care/ [] Refused Recommended Disposition /[] [place] [facility]/ []  Follow-up with PCP Additional Notes: Pt. Agrees with UC, no availability in the practice.  Reason for Disposition  Earache persists > 1 hour  Answer Assessment - Initial Assessment Questions 1. LOCATION: "Which ear is involved?"       Both ears 2. SENSATION: "Describe how the ear feels." (e.g. stuffy, full, plugged)."      Feels full 3. ONSET:  "When did the ear symptoms start?"       Sunday 4. PAIN: "Do you also have an earache?" If Yes, ask: "How bad is it?" (Scale 1-10; or mild, moderate, severe)     No 5. CAUSE: "What do you think is causing the ear congestion?"     ScubA DIVING 6. URI: "Do you have a runny nose or cough?"      No 7. NASAL ALLERGIES: "Are there symptoms of hay fever, such as sneezing or a clear nasal discharge?"     No 8. PREGNANCY: "Is there any chance you are pregnant?" "When was your last menstrual period?"     No  Protocols used: Ear - Congestion-A-AH

## 2023-01-25 ENCOUNTER — Encounter: Payer: Medicare Other | Admitting: Internal Medicine

## 2023-01-31 ENCOUNTER — Other Ambulatory Visit: Payer: Self-pay | Admitting: Internal Medicine

## 2023-01-31 NOTE — Telephone Encounter (Signed)
Requested Prescriptions  Pending Prescriptions Disp Refills   buPROPion (WELLBUTRIN XL) 150 MG 24 hr tablet [Pharmacy Med Name: BUPROPION HCL XL 150 MG TABLET] 90 tablet 0    Sig: TAKE 1 TABLET BY MOUTH EVERY DAY     Psychiatry: Antidepressants - bupropion Failed - 01/31/2023  2:47 AM      Failed - Cr in normal range and within 360 days    Creat  Date Value Ref Range Status  06/11/2022 1.36 (H) 0.50 - 1.05 mg/dL Final   Creatinine,U  Date Value Ref Range Status  03/12/2019 131.0 mg/dL Final   Creatinine, Urine  Date Value Ref Range Status  01/23/2022 69 20 - 275 mg/dL Final         Passed - AST in normal range and within 360 days    AST  Date Value Ref Range Status  06/11/2022 28 10 - 35 U/L Final         Passed - ALT in normal range and within 360 days    ALT  Date Value Ref Range Status  06/11/2022 22 6 - 29 U/L Final         Passed - Completed PHQ-2 or PHQ-9 in the last 360 days      Passed - Last BP in normal range    BP Readings from Last 1 Encounters:  01/24/23 115/72         Passed - Valid encounter within last 6 months    Recent Outpatient Visits           7 months ago Type 2 diabetes mellitus with diabetic mononeuropathy, without long-term current use of insulin (HCC)   [place] [facility], Salvadore Oxford, NP   1 year ago Screening for colon cancer   [place] [facility] [place], Salvadore Oxford, NP   1 year ago Opioid-induced constipation (OIC)   [place] [facility] [place], Salvadore Oxford, NP   1 year ago Preop general physical exam   [place] [facility] [place], Oswaldo Conroy, [place]   1 year ago Orthostasis   [place] [facility] [place], Salvadore Oxford, NP       Future Appointments             In 2 weeks Richardean Sale, DO [facility] [place] [facility] at [facility]   In 2 weeks [place], Salvadore Oxford, NP [facility]   In 2 months Asa Lente, Bernadette Hoit,  PA-C [employer] at [employer], [facility]

## 2023-02-04 ENCOUNTER — Encounter: Payer: Medicare Other | Admitting: Internal Medicine

## 2023-02-05 ENCOUNTER — Ambulatory Visit: Payer: 59 | Admitting: Sports Medicine

## 2023-02-12 ENCOUNTER — Ambulatory Visit: Payer: 59 | Admitting: Cardiology

## 2023-02-13 DIAGNOSIS — Z89421 Acquired absence of other right toe(s): Secondary | ICD-10-CM | POA: Diagnosis not present

## 2023-02-13 DIAGNOSIS — L601 Onycholysis: Secondary | ICD-10-CM | POA: Diagnosis not present

## 2023-02-13 DIAGNOSIS — M2041 Other hammer toe(s) (acquired), right foot: Secondary | ICD-10-CM | POA: Diagnosis not present

## 2023-02-13 DIAGNOSIS — S90422A Blister (nonthermal), left great toe, initial encounter: Secondary | ICD-10-CM | POA: Diagnosis not present

## 2023-02-13 DIAGNOSIS — E1142 Type 2 diabetes mellitus with diabetic polyneuropathy: Secondary | ICD-10-CM | POA: Diagnosis not present

## 2023-02-13 DIAGNOSIS — M216X1 Other acquired deformities of right foot: Secondary | ICD-10-CM | POA: Diagnosis not present

## 2023-02-13 DIAGNOSIS — Q667 Congenital pes cavus, unspecified foot: Secondary | ICD-10-CM | POA: Diagnosis not present

## 2023-02-13 DIAGNOSIS — G894 Chronic pain syndrome: Secondary | ICD-10-CM | POA: Diagnosis not present

## 2023-02-13 DIAGNOSIS — Z09 Encounter for follow-up examination after completed treatment for conditions other than malignant neoplasm: Secondary | ICD-10-CM | POA: Diagnosis not present

## 2023-02-13 DIAGNOSIS — B351 Tinea unguium: Secondary | ICD-10-CM | POA: Diagnosis not present

## 2023-02-13 DIAGNOSIS — M2042 Other hammer toe(s) (acquired), left foot: Secondary | ICD-10-CM | POA: Diagnosis not present

## 2023-02-13 DIAGNOSIS — L84 Corns and callosities: Secondary | ICD-10-CM | POA: Diagnosis not present

## 2023-02-13 NOTE — Progress Notes (Unsigned)
Melinda [name] D.[name] [address] [place] [address] Phone: [phone]   Assessment and Plan:     There are no diagnoses linked to this encounter.  ***   Pertinent previous records reviewed include ***   Follow Up: ***     Subjective:   I,  , am serving as a Neurosurgeon for Doctor Richardean Sale   Chief Complaint: left sided back and shoulder pain    HPI:    04/17/22 Patient is a 67 year old female complaining of left sided back and shoulder pain. Patient states chiro used to described it as a rib head that was out of alignment , if she sits with her arms crossed she will have back pain due the back of the chair not being high enough when she has pressure on it she has a little bit of relief, is using CBD , recently took her pain pump out , raising her left arm like trying to drive is painful, tue the wrist over is also painful , hard time getting comfortable when she is gong to sleep, thinks when she was using her using her push mower is when she aggravated it , a week ago , does not want pain meds , Maybe OMT     11/05/2022 Patient states left elbow pain and knee pain is really bad , she has been swimming a lot more , states she has a weird thing happening in her arm starts in the elbow and down to the wrist, she endorses a painful pop and the pain last for a good minute, she will be using her arm to bring a drink or something to her face and her arm will just fall , tylenol for the pain and that helps with the combination of CBD , when she is swimming she can't turn her neck because it is pain , she does a breaststroke and she say she can hear a scrunch noise    01/01/2023 Patient states that she is having the same pain again,  neck pain does states she has a cage in her. States that she will be filing her nails and the left hand will go numb she has left thumb numbness all the time     02/14/2023 Patient states   Relevant  Historical Information: Chronic back pain, lumbar DDD requiring intrathecal pain pump which was removed earlier this year.  DM type II  Additional pertinent review of systems negative.   Current Outpatient Medications:    ACCU-CHEK GUIDE test strip, USE 1 EACH BY OTHER ROUTE 3 (THREE) TIMES DAILY. USE AS INSTRUCTE, Disp: 200 strip, Rfl: 1   Accu-Chek Softclix Lancets lancets, USE TO CHECK BLOOD SUGAR 1 TIME DAILY, Disp: 100 each, Rfl: 2   albuterol (VENTOLIN HFA) 108 (90 Base) MCG/ACT inhaler, Inhale 2 puffs into the lungs every 4 (four) hours as needed for wheezing or shortness of breath., Disp: 1 each, Rfl: 0   Ascorbic Acid (VITAMIN C) 1000 MG tablet, Take 1,000 mg by mouth daily., Disp: , Rfl:    ASPIRIN 81 PO, Take 81 mg by mouth daily., Disp: , Rfl:    Biotin 21308 MCG TABS, Take 10,000 mcg by mouth daily., Disp: , Rfl:    Blood Glucose Calibration (ACCU-CHEK GUIDE CONTROL) LIQD, 1 each by In Vitro route as needed., Disp: 1 each, Rfl: 1   buPROPion (WELLBUTRIN XL) 150 MG 24 hr tablet, TAKE 1 TABLET BY MOUTH EVERY DAY, Disp: 90  tablet, Rfl: 0   CALCIUM-MAGNESIUM-ZINC PO, Take 3 capsules by mouth daily., Disp: , Rfl:    CINNAMON PO, Take 1,000 mg by mouth daily., Disp: , Rfl:    Coenzyme Q10 (CO Q 10 PO), Take 1 tablet by mouth daily., Disp: , Rfl:    ezetimibe (ZETIA) 10 MG tablet, TAKE 1 TABLET BY MOUTH EVERY DAY, Disp: 90 tablet, Rfl: 2   fluticasone (FLONASE) 50 MCG/ACT nasal spray, Place 2 sprays into both nostrils daily., Disp: 16 g, Rfl: 0   glucosamine-chondroitin 500-400 MG tablet, Take 2 tablets by mouth daily. , Disp: , Rfl:    Insulin Pen Needle 31G X 5 MM MISC, BD Pen Needles- brand specific Inject insulin via insulin pen 6 x daily, Disp: 100 each, Rfl: 2   meloxicam (MOBIC) 15 MG tablet, Take 1 tablet (15 mg total) by mouth daily., Disp: 30 tablet, Rfl: 0   nitroGLYCERIN (NITROSTAT) 0.4 MG SL tablet, PLACE 1 TABLET UNDER THE TONGUE EVERY 5 (FIVE) MINUTES AS NEEDED FOR CHEST  PAIN., Disp: 25 tablet, Rfl: 4   Omega-3 Fatty Acids (FISH OIL) 1200 MG CAPS, Take 1,200 mg by mouth in the morning. , Disp: , Rfl:    PARoxetine (PAXIL) 20 MG tablet, TAKE 1/2 TABLET BY MOUTH DAILY IN THE MORNING, Disp: 45 tablet, Rfl: 1   pregabalin (LYRICA) 150 MG capsule, TAKE 1 CAPSULE BY MOUTH THREE TIMES A DAY, Disp: 270 capsule, Rfl: 0   rosuvastatin (CRESTOR) 20 MG tablet, TAKE 1 TABLET BY MOUTH EVERY DAY, Disp: 90 tablet, Rfl: 2   tirzepatide (MOUNJARO) 2.5 MG/0.5ML Pen, INJECT 2.5 MG SUBCUTANEOUSLY WEEKLY, Disp: 6 mL, Rfl: 0   tiZANidine (ZANAFLEX) 2 MG tablet, Take 2 mg by mouth in the morning and at bedtime., Disp: , Rfl:    vitamin E 180 MG (400 UNITS) capsule, Take 400 Units by mouth daily., Disp: , Rfl:    Objective:     There were no vitals filed for this visit.    There is no height or weight on file to calculate BMI.    Physical Exam:    ***   Electronically signed by:  Melinda [name] D.[name] 7:28 AM 02/13/23

## 2023-02-14 ENCOUNTER — Ambulatory Visit (INDEPENDENT_AMBULATORY_CARE_PROVIDER_SITE_OTHER): Payer: 59 | Admitting: Sports Medicine

## 2023-02-14 VITALS — HR 69 | Ht 69.0 in | Wt 188.0 lb

## 2023-02-14 DIAGNOSIS — M5010 Cervical disc disorder with radiculopathy, unspecified cervical region: Secondary | ICD-10-CM | POA: Diagnosis not present

## 2023-02-14 DIAGNOSIS — M47812 Spondylosis without myelopathy or radiculopathy, cervical region: Secondary | ICD-10-CM | POA: Diagnosis not present

## 2023-02-14 DIAGNOSIS — M542 Cervicalgia: Secondary | ICD-10-CM

## 2023-02-14 DIAGNOSIS — Z981 Arthrodesis status: Secondary | ICD-10-CM

## 2023-02-14 NOTE — Patient Instructions (Signed)
Epidural referral left c6-7 Follow up 2 weeks after to discuss results

## 2023-02-18 ENCOUNTER — Encounter: Payer: Self-pay | Admitting: Internal Medicine

## 2023-02-18 ENCOUNTER — Ambulatory Visit (INDEPENDENT_AMBULATORY_CARE_PROVIDER_SITE_OTHER): Payer: 59 | Admitting: Internal Medicine

## 2023-02-18 VITALS — BP 104/60 | HR 78 | Temp 96.6°F | Ht 69.0 in | Wt 193.0 lb

## 2023-02-18 DIAGNOSIS — Z23 Encounter for immunization: Secondary | ICD-10-CM | POA: Diagnosis not present

## 2023-02-18 DIAGNOSIS — Z1231 Encounter for screening mammogram for malignant neoplasm of breast: Secondary | ICD-10-CM

## 2023-02-18 DIAGNOSIS — Z0001 Encounter for general adult medical examination with abnormal findings: Secondary | ICD-10-CM

## 2023-02-18 DIAGNOSIS — E1141 Type 2 diabetes mellitus with diabetic mononeuropathy: Secondary | ICD-10-CM

## 2023-02-18 DIAGNOSIS — E663 Overweight: Secondary | ICD-10-CM | POA: Diagnosis not present

## 2023-02-18 DIAGNOSIS — Z6828 Body mass index (BMI) 28.0-28.9, adult: Secondary | ICD-10-CM

## 2023-02-18 MED ORDER — TIRZEPATIDE 5 MG/0.5ML ~~LOC~~ SOAJ
5.0000 mg | SUBCUTANEOUS | 1 refills | Status: DC
Start: 2023-02-18 — End: 2023-08-29

## 2023-02-18 MED ORDER — TIZANIDINE HCL 2 MG PO TABS: 2.0000 mg | ORAL_TABLET | Freq: Two times a day (BID) | ORAL | 1 refills | Status: DC

## 2023-02-18 NOTE — Addendum Note (Signed)
Addended by: Kavin Leech E on: 02/18/2023 04:52 PM   Modules accepted: Orders

## 2023-02-18 NOTE — Progress Notes (Signed)
Subjective:    Patient ID: Melinda [name], female    DOB: September 27, 1955, 67 y.o.   MRN: [id number]  HPI  Patient presents to clinic today for her annual exam.  Flu: 04/2022 Tetanus: 07/2019 COVID: X 6 Pneumovax: 06/2021 Prevnar: Shingrix: 03/2019 Pap smear: 08/2017 Mammogram: 05/2022 Bone density: 04/2022 Colon screening: 01/2022 Vision screening: annually Dentist: as needed  Diet: She does eat meat. She consumes fruits and veggies. She tries to avoid fried foods. She drinks mostly water, Dt. 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 b.) Doppler 06/28/2015: 40-59% BILATERAL ICA. c.) Dopplers 04/23/2019, 04/22/2020, 10/20/2021: 40-59% RICA and 1-39% LICA.   Blue toes    2nd toe on right foot, will get appt.   Bulging disc    CAD (coronary artery disease) 2009   a.) PCI with placement of a 15 x 30 mm Promus DES to mLAD. b.) LHC 02/27/2011: EF 60%; no sig CAD; mild lum irreg at ostium of diagnoal (jailed by stent).   Chicken pox    Chronic fatigue    Chronic, continuous use of opioids    CKD (chronic kidney disease), stage III (HCC)    COVID 02/22/2022   Degenerative disc disease    Depression    Diverticulosis    Facet joint disease    Failed cervical fusion    Fibromyalgia    Hiatal hernia    Hyperlipidemia    IBS (irritable bowel syndrome)    MCL deficiency, knee    MRSA (methicillin resistant staph aureus) culture positive 2011   GREAT TOE RIGHT FOOT   Neuropathy 04/18/2010   Orthostatic hypotension 01/2020   a.) multiple pre-syncopal episodes; takes proamatine   OSA on CPAP 08/17/2003   Osteoarthritis    a.) back,  neck, hands, knees   Restless leg syndrome    Sepsis (HCC) 09/2013   Spinal headache 11/07/2021   Spinal stenosis    T2DM (type 2 diabetes mellitus) (HCC)    Wolff-Parkinson-White (WPW) syndrome 1992   a.) s/p ablations; unsuccessful. Now has rare episodes    Current Outpatient Medications  Medication Sig Dispense Refill   ACCU-CHEK GUIDE test strip USE 1 EACH BY OTHER ROUTE 3 (THREE) TIMES DAILY. USE AS INSTRUCTE 200 strip 1   Accu-Chek Softclix Lancets lancets USE TO CHECK BLOOD SUGAR 1 TIME DAILY 100 each 2   albuterol (VENTOLIN HFA) 108 (90 Base) MCG/ACT inhaler Inhale 2 puffs into the lungs every 4 (four) hours as needed for wheezing or shortness of breath. 1 each 0   Ascorbic Acid (VITAMIN C) 1000 MG tablet Take 1,000 mg by mouth daily.     ASPIRIN 81 PO Take 81 mg by mouth daily.     Biotin 56387 MCG TABS Take 10,000 mcg by mouth daily.     Blood Glucose Calibration (ACCU-CHEK GUIDE CONTROL) LIQD 1 each by In Vitro route as needed. 1 each 1   buPROPion (WELLBUTRIN XL) 150 MG 24 hr tablet TAKE 1 TABLET BY MOUTH EVERY DAY 90 tablet 0   CALCIUM-MAGNESIUM-ZINC PO Take 3 capsules by mouth daily.     CINNAMON PO Take 1,000 mg  by mouth daily.     Coenzyme Q10 (CO Q 10 PO) Take 1 tablet by mouth daily.     ezetimibe (ZETIA) 10 MG tablet TAKE 1 TABLET BY MOUTH EVERY DAY 90 tablet 2   fluticasone (FLONASE) 50 MCG/ACT nasal spray Place 2 sprays into both nostrils daily. 16 g 0   glucosamine-chondroitin 500-400 MG tablet Take 2 tablets by mouth daily.      Insulin Pen Needle 31G X 5 MM MISC BD Pen Needles- brand specific Inject insulin via insulin pen 6 x daily 100 each 2   meloxicam (MOBIC) 15 MG tablet Take 1 tablet (15 mg total) by mouth daily. 30 tablet 0   nitroGLYCERIN (NITROSTAT) 0.4 MG SL tablet PLACE 1 TABLET UNDER THE TONGUE EVERY 5 (FIVE) MINUTES AS NEEDED FOR CHEST PAIN. 25 tablet 4   Omega-3 Fatty Acids (FISH OIL) 1200 MG CAPS Take 1,200 mg by mouth in the morning.       PARoxetine (PAXIL) 20 MG tablet TAKE 1/2 TABLET BY MOUTH DAILY IN THE MORNING 45 tablet 1   pregabalin (LYRICA) 150 MG capsule TAKE 1 CAPSULE BY MOUTH THREE TIMES A DAY 270 capsule 0   rosuvastatin (CRESTOR) 20 MG tablet TAKE 1 TABLET BY MOUTH EVERY DAY 90 tablet 2   tirzepatide (MOUNJARO) 2.5 MG/0.5ML Pen INJECT 2.5 MG SUBCUTANEOUSLY WEEKLY 6 mL 0   tiZANidine (ZANAFLEX) 2 MG tablet Take 2 mg by mouth in the morning and at bedtime.     vitamin E 180 MG (400 UNITS) capsule Take 400 Units by mouth daily.     No current facility-administered medications for this visit.    No Known Allergies  Family History  Problem Relation Age of Onset   Cancer Mother    Lung cancer Mother    Arthritis Father    Heart disease Father    Diabetes Father    Dementia Maternal Grandmother    Alcohol abuse Paternal Grandfather    Heart disease Paternal Grandfather    Stroke Paternal Grandfather     Social History   Socioeconomic History   Marital status: Significant Other    Spouse name: Not on file   Number of children: Not on file   Years of education: Not on file   Highest education level: GED or equivalent  Occupational History   Not on file  Tobacco Use   Smoking status: Former    Current packs/day: 0.00    Average packs/day: 1.5 packs/day for 32.0 years (48.0 ttl pk-yrs)    Types: Cigarettes    Start date: 07/22/1975    Quit date: 07/22/2007    Years since quitting: 15.5   Smokeless tobacco: Former  Building services engineer status: Never Used  Substance and Sexual Activity   Alcohol use: Not Currently    Comment: occ - Holidays   Drug use: Not Currently    Comment: prescribed pain pump and oxy   Sexual activity: Yes  Other Topics Concern   Not on file  Social History Narrative   Lives at home with a partner. Independent at baseline   Social Determinants of Health   Financial Resource Strain: Low Risk  (02/14/2023)   Overall Financial Resource Strain (CARDIA)    Difficulty of Paying  Living Expenses: Not hard at all  Food Insecurity: No Food Insecurity (02/14/2023)   Hunger Vital Sign    Worried About Running Out of Food in the Last Year: Never true    Ran Out of Food in  the Last Year: Never true  Transportation Needs: No Transportation Needs (02/14/2023)   PRAPARE - Administrator, Civil Service (Medical): No    Lack of Transportation (Non-Medical): No  Physical Activity: Inactive (02/14/2023)   Exercise Vital Sign    Days of Exercise per Week: 0 days    Minutes of Exercise per Session: 60 min  Stress: No Stress Concern Present (02/14/2023)   [employer] of Occupational Health - Occupational Stress Questionnaire    Feeling of Stress : Only a little  Social Connections: Moderately Isolated (02/14/2023)   Social Connection and Isolation Panel [NHANES]    Frequency of Communication with Friends and Family: Never    Frequency of Social Gatherings with Friends and Family: Once a week    Attends Religious Services: Never    Database administrator or Organizations: Yes    Attends Banker Meetings: 1 to 4 times per year    Marital Status: Living with partner  Intimate Partner Violence: Not At Risk (10/12/2022)   Humiliation, Afraid, Rape, and Kick questionnaire    Fear of Current or Ex-Partner: No    Emotionally Abused: No    Physically Abused: No    Sexually Abused: No     Constitutional: Denies fever, malaise, fatigue, headache or abrupt weight changes.  HEENT: Denies eye pain, eye redness, ear pain, ringing in the ears, wax buildup, runny nose, nasal congestion, bloody nose, or sore throat. Respiratory: Denies difficulty breathing, shortness of breath, cough or sputum production.   Cardiovascular: Denies chest pain, chest tightness, palpitations or swelling in the hands or feet.  Gastrointestinal: Denies abdominal pain, bloating, constipation, diarrhea or blood in the stool.  GU: Denies urgency, frequency, pain with urination, burning  sensation, blood in urine, odor or discharge. Musculoskeletal: Patient reports chronic joint and muscle pain.  Denies decrease in range of motion, difficulty with gait, or joint swelling.  Skin: Denies redness, rashes, lesions or ulcercations.  Neurological: Patient reports neuropathic pain.  Denies dizziness, difficulty with memory, difficulty with speech or problems with balance and coordination.  Psych: Patient has a history of anxiety and depression.  Denies SI/HI.  No other specific complaints in a complete review of systems (except as listed in HPI above).     Objective:   Physical Exam   Blood pressure 104/60, pulse 78, temperature (!) 96.6 F (35.9 C), temperature source Temporal, height 5\' 9"  (1.753 m), weight 193 lb (87.5 kg), SpO2 96%.SUBJECTIVE:  Wt Readings from Last 3 Encounters:  02/14/23 188 lb (85.3 kg)  01/01/23 195 lb (88.5 kg)  11/05/22 198 lb (89.8 kg)    General: Appears her  stated age,overweight,  in NAD. Skin: Warm, dry and intact. Irritation noted behind right ear. No ulcerations noted. HEENT: Head: normal shape and size; Eyes: sclera white, no icterus, conjunctiva pink, PERRLA and EOMs intact;  Neck:  Neck supple, trachea midline. No masses, lumps or thyromegaly present.  Cardiovascular: Normal rate and rhythm. S1,S2 noted.  No murmur, rubs or gallops noted. No JVD or BLE edema. No carotid bruits noted. Pulmonary/Chest: Normal effort and positive vesicular breath sounds. No respiratory distress. No wheezes, rales or ronchi noted.  Abdomen: Normal bowel sounds.  Musculoskeletal: Strength 5/5 BUE/BLE. No difficulty with gait.  Neurological: Alert and oriented. Cranial nerves II-XII grossly intact. Coordination normal.  Psychiatric: Mood and affect normal. Behavior is normal. Judgment and thought content normal.    BMET    Component Value Date/Time   NA 143 06/11/2022  1431   NA 139 10/23/2021 1552   NA 135 (L) 06/30/2013 1203   K 4.7 06/11/2022 1431    K 4.1 06/30/2013 1203   CL 103 06/11/2022 1431   CL 102 06/30/2013 1203   CO2 31 06/11/2022 1431   CO2 32 06/30/2013 1203   GLUCOSE 72 06/11/2022 1431   GLUCOSE 143 (H) 06/30/2013 1203   BUN 21 06/11/2022 1431   BUN 21 10/23/2021 1552   BUN 19 (H) 06/30/2013 1203   CREATININE 1.36 (H) 06/11/2022 1431   CALCIUM 10.0 06/11/2022 1431   CALCIUM 9.7 06/30/2013 1203   GFRNONAA 56 (L) 12/21/2020 0957   GFRAA 65 12/21/2020 0957    Lipid Panel     Component Value Date/Time   CHOL 141 06/11/2022 1431   TRIG 146 06/11/2022 1431   HDL 51 06/11/2022 1431   CHOLHDL 2.8 06/11/2022 1431   VLDL 31.6 03/12/2019 1255   LDLCALC 67 06/11/2022 1431    CBC    Component Value Date/Time   WBC 6.3 06/11/2022 1431   RBC 4.93 06/11/2022 1431   HGB 14.4 06/11/2022 1431   HGB 14.0 11/12/2011 1136   HCT 41.8 06/11/2022 1431   HCT 40.7 11/12/2011 1136   PLT 229 06/11/2022 1431   PLT 274 11/12/2011 1136   MCV 84.8 06/11/2022 1431   MCV 86 11/12/2011 1136   MCH 29.2 06/11/2022 1431   MCHC 34.4 06/11/2022 1431   RDW 12.4 06/11/2022 1431   RDW 12.8 11/12/2011 1136   LYMPHSABS 1.4 09/06/2020 1330   LYMPHSABS 2.5 11/12/2011 1136   MONOABS 0.5 09/06/2020 1330   MONOABS 0.8 11/12/2011 1136   EOSABS 0.2 09/06/2020 1330   EOSABS 0.6 11/12/2011 1136   BASOSABS 0.1 09/06/2020 1330   BASOSABS 0.1 11/12/2011 1136    Hgb A1C Lab Results  Component Value Date   HGBA1C 5.6 06/11/2022           Assessment & Plan:   Preventative health maintenance:  Encouraged her to get a flu shot in fall Tetanus UTD COVID-vaccine UTD Prevnar 20 today Encouraged her to get her second Shingrix vaccine She no longer needs to screen for cervical cancer Mammogram ordered-she will call to schedule Bone density UTD Colon screening UTD Encouraged to consume a balanced diet and exercise regimen Advised her to see an eye doctor and dentist annually We will check CBC, c-Met, lipid, A1c and urine microalbumin  today  RTC in 6 months, follow-up chronic conditions Nicki Reaper, NP

## 2023-02-18 NOTE — Assessment & Plan Note (Signed)
Encouraged diet and exercise for weight loss ?

## 2023-02-18 NOTE — Patient Instructions (Signed)
Health Maintenance for Postmenopausal Women Menopause is a normal process in which your ability to get pregnant comes to an end. This process happens slowly over many months or years, usually between the ages of 48 and 55. Menopause is complete when you have missed your menstrual period for 12 months. It is important to talk with your health care provider about some of the most common conditions that affect women after menopause (postmenopausal women). These include heart disease, cancer, and bone loss (osteoporosis). Adopting a healthy lifestyle and getting preventive care can help to promote your health and wellness. The actions you take can also lower your chances of developing some of these common conditions. What are the signs and symptoms of menopause? During menopause, you may have the following symptoms: Hot flashes. These can be moderate or severe. Night sweats. Decrease in sex drive. Mood swings. Headaches. Tiredness (fatigue). Irritability. Memory problems. Problems falling asleep or staying asleep. Talk with your health care provider about treatment options for your symptoms. Do I need hormone replacement therapy? Hormone replacement therapy is effective in treating symptoms that are caused by menopause, such as hot flashes and night sweats. Hormone replacement carries certain risks, especially as you become older. If you are thinking about using estrogen or estrogen with progestin, discuss the benefits and risks with your health care provider. How can I reduce my risk for heart disease and stroke? The risk of heart disease, heart attack, and stroke increases as you age. One of the causes may be a change in the body's hormones during menopause. This can affect how your body uses dietary fats, triglycerides, and cholesterol. Heart attack and stroke are medical emergencies. There are many things that you can do to help prevent heart disease and stroke. Watch your blood pressure High  blood pressure causes heart disease and increases the risk of stroke. This is more likely to develop in people who have high blood pressure readings or are overweight. Have your blood pressure checked: Every 3-5 years if you are 18-39 years of age. Every year if you are 40 years old or older. Eat a healthy diet  Eat a diet that includes plenty of vegetables, fruits, low-fat dairy products, and lean protein. Do not eat a lot of foods that are high in solid fats, added sugars, or sodium. Get regular exercise Get regular exercise. This is one of the most important things you can do for your health. Most adults should: Try to exercise for at least 150 minutes each week. The exercise should increase your heart rate and make you sweat (moderate-intensity exercise). Try to do strengthening exercises at least twice each week. Do these in addition to the moderate-intensity exercise. Spend less time sitting. Even light physical activity can be beneficial. Other tips Work with your health care provider to achieve or maintain a healthy weight. Do not use any products that contain nicotine or tobacco. These products include cigarettes, chewing tobacco, and vaping devices, such as e-cigarettes. If you need help quitting, ask your health care provider. Know your numbers. Ask your health care provider to check your cholesterol and your blood sugar (glucose). Continue to have your blood tested as directed by your health care provider. Do I need screening for cancer? Depending on your health history and family history, you may need to have cancer screenings at different stages of your life. This may include screening for: Breast cancer. Cervical cancer. Lung cancer. Colorectal cancer. What is my risk for osteoporosis? After menopause, you may be   at increased risk for osteoporosis. Osteoporosis is a condition in which bone destruction happens more quickly than new bone creation. To help prevent osteoporosis or  the bone fractures that can happen because of osteoporosis, you may take the following actions: If you are 19-50 years old, get at least 1,000 mg of calcium and at least 600 international units (IU) of vitamin D per day. If you are older than age 50 but younger than age 70, get at least 1,200 mg of calcium and at least 600 international units (IU) of vitamin D per day. If you are older than age 70, get at least 1,200 mg of calcium and at least 800 international units (IU) of vitamin D per day. Smoking and drinking excessive alcohol increase the risk of osteoporosis. Eat foods that are rich in calcium and vitamin D, and do weight-bearing exercises several times each week as directed by your health care provider. How does menopause affect my mental health? Depression may occur at any age, but it is more common as you become older. Common symptoms of depression include: Feeling depressed. Changes in sleep patterns. Changes in appetite or eating patterns. Feeling an overall lack of motivation or enjoyment of activities that you previously enjoyed. Frequent crying spells. Talk with your health care provider if you think that you are experiencing any of these symptoms. General instructions See your health care provider for regular wellness exams and vaccines. This may include: Scheduling regular health, dental, and eye exams. Getting and maintaining your vaccines. These include: Influenza vaccine. Get this vaccine each year before the flu season begins. Pneumonia vaccine. Shingles vaccine. Tetanus, diphtheria, and pertussis (Tdap) booster vaccine. Your health care provider may also recommend other immunizations. Tell your health care provider if you have ever been abused or do not feel safe at home. Summary Menopause is a normal process in which your ability to get pregnant comes to an end. This condition causes hot flashes, night sweats, decreased interest in sex, mood swings, headaches, or lack  of sleep. Treatment for this condition may include hormone replacement therapy. Take actions to keep yourself healthy, including exercising regularly, eating a healthy diet, watching your weight, and checking your blood pressure and blood sugar levels. Get screened for cancer and depression. Make sure that you are up to date with all your vaccines. This information is not intended to replace advice given to you by your health care provider. Make sure you discuss any questions you have with your health care provider. Document Revised: 11/07/2020 Document Reviewed: 11/07/2020 Elsevier Patient Education  2024 [employer]  

## 2023-02-19 LAB — CBC
HCT: 37.9 % (ref 35.0–45.0)
Hemoglobin: 12.8 g/dL (ref 11.7–15.5)
MCH: 28.9 pg (ref 27.0–33.0)
MCHC: 33.8 g/dL (ref 32.0–36.0)
MCV: 85.6 fL (ref 80.0–100.0)
MPV: 9.2 fL (ref 7.5–12.5)
Platelets: 223 10*3/uL (ref 140–400)
RBC: 4.43 10*6/uL (ref 3.80–5.10)
RDW: 12.4 % (ref 11.0–15.0)
WBC: 4.8 10*3/uL (ref 3.8–10.8)

## 2023-02-19 LAB — COMPLETE METABOLIC PANEL WITH GFR
AG Ratio: 1.8 (calc) (ref 1.0–2.5)
ALT: 23 U/L (ref 6–29)
AST: 29 U/L (ref 10–35)
Albumin: 4.3 g/dL (ref 3.6–5.1)
Alkaline phosphatase (APISO): 124 U/L (ref 37–153)
BUN/Creatinine Ratio: 26 (calc) — ABNORMAL HIGH (ref 6–22)
BUN: 29 mg/dL — ABNORMAL HIGH (ref 7–25)
CO2: 27 mmol/L (ref 20–32)
Calcium: 8.8 mg/dL (ref 8.6–10.4)
Chloride: 106 mmol/L (ref 98–110)
Creat: 1.11 mg/dL — ABNORMAL HIGH (ref 0.50–1.05)
Globulin: 2.4 g/dL (ref 1.9–3.7)
Glucose, Bld: 105 mg/dL (ref 65–139)
Potassium: 4.5 mmol/L (ref 3.5–5.3)
Sodium: 140 mmol/L (ref 135–146)
Total Bilirubin: 0.3 mg/dL (ref 0.2–1.2)
Total Protein: 6.7 g/dL (ref 6.1–8.1)
eGFR: 55 mL/min/{1.73_m2} — ABNORMAL LOW (ref >=60)

## 2023-02-19 LAB — HEMOGLOBIN A1C
Hgb A1c MFr Bld: 5.6 %{Hb} (ref <5.7)
Mean Plasma Glucose: 114 mg/dL
eAG (mmol/L): 6.3 mmol/L

## 2023-02-19 LAB — LIPID PANEL
Cholesterol: 112 mg/dL (ref <200)
HDL: 44 mg/dL — ABNORMAL LOW (ref >=50)
LDL Cholesterol (Calc): 45 mg/dL
Non-HDL Cholesterol (Calc): 68 mg/dL (ref <130)
Total CHOL/HDL Ratio: 2.5 (calc) (ref <5.0)
Triglycerides: 144 mg/dL (ref <150)

## 2023-02-19 LAB — MICROALBUMIN / CREATININE URINE RATIO
Creatinine, Urine: 251 mg/dL (ref 20–275)
Microalb Creat Ratio: 6 mg/g{creat} (ref <30)
Microalb, Ur: 1.5 mg/dL

## 2023-02-21 ENCOUNTER — Encounter: Payer: Self-pay | Admitting: Sports Medicine

## 2023-03-01 NOTE — Discharge Instructions (Signed)

## 2023-03-05 ENCOUNTER — Ambulatory Visit
Admission: RE | Admit: 2023-03-05 | Discharge: 2023-03-05 | Disposition: A | Discharge status: Home / Self Care | Payer: 59 | Source: Ambulatory Visit | Attending: Sports Medicine | Admitting: Sports Medicine

## 2023-03-05 DIAGNOSIS — M4722 Other spondylosis with radiculopathy, cervical region: Secondary | ICD-10-CM | POA: Diagnosis not present

## 2023-03-05 DIAGNOSIS — Z981 Arthrodesis status: Secondary | ICD-10-CM

## 2023-03-05 DIAGNOSIS — M542 Cervicalgia: Secondary | ICD-10-CM

## 2023-03-05 DIAGNOSIS — M47812 Spondylosis without myelopathy or radiculopathy, cervical region: Secondary | ICD-10-CM

## 2023-03-05 DIAGNOSIS — M5010 Cervical disc disorder with radiculopathy, unspecified cervical region: Secondary | ICD-10-CM

## 2023-03-05 MED ORDER — TRIAMCINOLONE ACETONIDE 40 MG/ML IJ SUSP (RADIOLOGY)
60.0000 mg | Freq: Once | INTRAMUSCULAR | Status: AC
  Administered 2023-03-05: 60 mg via EPIDURAL

## 2023-03-05 MED ORDER — IOPAMIDOL (ISOVUE-M 300) INJECTION 61%
1.0000 mL | Freq: Once | INTRAMUSCULAR | Status: AC
  Administered 2023-03-05: 1 mL via EPIDURAL

## 2023-03-18 NOTE — Progress Notes (Unsigned)
Aleen Sells D.[facility] [address] [place] [address] Phone: [phone]   Assessment and Plan:     There are no diagnoses linked to this encounter.  ***   Pertinent previous records reviewed include ***   Follow Up: ***     Subjective:   I, Melinda [name], am serving as a Neurosurgeon for Doctor Richardean Sale   Chief Complaint: left sided back and shoulder pain    HPI:    04/17/22 Patient is a 67 year old female complaining of left sided back and shoulder pain. Patient states chiro used to described it as a rib head that was out of alignment , if she sits with her arms crossed she will have back pain due the back of the chair not being high enough when she has pressure on it she has a little bit of relief, is using CBD , recently took her pain pump out , raising her left arm like trying to drive is painful, tue the wrist over is also painful , hard time getting comfortable when she is gong to sleep, thinks when she was using her using her push mower is when she aggravated it , a week ago , does not want pain meds , Maybe OMT     11/05/2022 Patient states left elbow pain and knee pain is really bad , she has been swimming a lot more , states she has a weird thing happening in her arm starts in the elbow and down to the wrist, she endorses a painful pop and the pain last for a good minute, she will be using her arm to bring a drink or something to her face and her arm will just fall , tylenol for the pain and that helps with the combination of CBD , when she is swimming she can't turn her neck because it is pain , she does a breaststroke and she say she can hear a scrunch noise    01/01/2023 Patient states that she is having the same pain again,  neck pain does states she has a cage in her. States that she will be filing her nails and the left hand will go numb she has left thumb numbness all the time      02/14/2023 Patient states that she  still has neck pain and electrical  jolts to her hand and falling asleep   03/19/2023 Patient states   Relevant Historical Information: Chronic back pain, lumbar DDD requiring intrathecal pain pump which was removed earlier this year.  DM type II  Additional pertinent review of systems negative.   Current Outpatient Medications:    ACCU-CHEK GUIDE test strip, USE 1 EACH BY OTHER ROUTE 3 (THREE) TIMES DAILY. USE AS INSTRUCTE, Disp: 200 strip, Rfl: 1   Accu-Chek Softclix Lancets lancets, USE TO CHECK BLOOD SUGAR 1 TIME DAILY, Disp: 100 each, Rfl: 2   Ascorbic Acid (VITAMIN C) 1000 MG tablet, Take 1,000 mg by mouth daily., Disp: , Rfl:    ASPIRIN 81 PO, Take 81 mg by mouth daily., Disp: , Rfl:    Biotin 91478 MCG TABS, Take 10,000 mcg by mouth daily., Disp: , Rfl:    Blood Glucose Calibration (ACCU-CHEK GUIDE CONTROL) LIQD, 1 each by In Vitro route as needed., Disp: 1 each, Rfl: 1   buPROPion (WELLBUTRIN XL) 150 MG 24 hr tablet, TAKE 1 TABLET BY MOUTH EVERY DAY, Disp: 90 tablet, Rfl: 0   CALCIUM-MAGNESIUM-ZINC PO, Take 3 capsules by  mouth daily., Disp: , Rfl:    CINNAMON PO, Take 1,000 mg by mouth daily., Disp: , Rfl:    Coenzyme Q10 (CO Q 10 PO), Take 1 tablet by mouth daily., Disp: , Rfl:    ezetimibe (ZETIA) 10 MG tablet, TAKE 1 TABLET BY MOUTH EVERY DAY, Disp: 90 tablet, Rfl: 2   fluticasone (FLONASE) 50 MCG/ACT nasal spray, Place 2 sprays into both nostrils daily., Disp: 16 g, Rfl: 0   glucosamine-chondroitin 500-400 MG tablet, Take 2 tablets by mouth daily. , Disp: , Rfl:    nitroGLYCERIN (NITROSTAT) 0.4 MG SL tablet, PLACE 1 TABLET UNDER THE TONGUE EVERY 5 (FIVE) MINUTES AS NEEDED FOR CHEST PAIN., Disp: 25 tablet, Rfl: 4   Omega-3 Fatty Acids (FISH OIL) 1200 MG CAPS, Take 1,200 mg by mouth in the morning. , Disp: , Rfl:    PARoxetine (PAXIL) 20 MG tablet, TAKE 1/2 TABLET BY MOUTH DAILY IN THE MORNING, Disp: 45 tablet, Rfl: 1   pregabalin (LYRICA) 150 MG capsule, TAKE 1 CAPSULE BY MOUTH  THREE TIMES A DAY, Disp: 270 capsule, Rfl: 0   rosuvastatin (CRESTOR) 20 MG tablet, TAKE 1 TABLET BY MOUTH EVERY DAY, Disp: 90 tablet, Rfl: 2   tirzepatide (MOUNJARO) 5 MG/0.5ML Pen, Inject 5 mg into the skin once a week., Disp: 6 mL, Rfl: 1   tiZANidine (ZANAFLEX) 2 MG tablet, Take 1 tablet (2 mg total) by mouth in the morning and at bedtime., Disp: 180 tablet, Rfl: 1   vitamin E 180 MG (400 UNITS) capsule, Take 400 Units by mouth daily., Disp: , Rfl:    Objective:     There were no vitals filed for this visit.    There is no height or weight on file to calculate BMI.    Physical Exam:    ***   Electronically signed by:  Aleen Sells D[facility] 7:11 AM 03/18/23

## 2023-03-19 ENCOUNTER — Ambulatory Visit (INDEPENDENT_AMBULATORY_CARE_PROVIDER_SITE_OTHER): Payer: 59 | Admitting: Sports Medicine

## 2023-03-19 VITALS — HR 88 | Ht 69.0 in | Wt 190.0 lb

## 2023-03-19 DIAGNOSIS — Z981 Arthrodesis status: Secondary | ICD-10-CM

## 2023-03-19 DIAGNOSIS — M542 Cervicalgia: Secondary | ICD-10-CM

## 2023-03-19 DIAGNOSIS — M47812 Spondylosis without myelopathy or radiculopathy, cervical region: Secondary | ICD-10-CM | POA: Diagnosis not present

## 2023-04-04 ENCOUNTER — Other Ambulatory Visit: Payer: Self-pay | Admitting: Internal Medicine

## 2023-04-04 ENCOUNTER — Telehealth: Payer: Self-pay | Admitting: Internal Medicine

## 2023-04-04 DIAGNOSIS — M797 Fibromyalgia: Secondary | ICD-10-CM

## 2023-04-04 NOTE — Telephone Encounter (Signed)
Pt called in checking status of refill of pregablin. She is out of this, I let her know we just received the request this morning at 10:10 and we are working on it.

## 2023-04-04 NOTE — Telephone Encounter (Signed)
Requested medications are due for refill today.  yes  Requested medications are on the active medications list.  yes  Last refill. 12/31/2022 #270 0 rf  Future visit scheduled.   yes  Notes to clinic.  Refill not delegated.    Requested Prescriptions  Pending Prescriptions Disp Refills   pregabalin (LYRICA) 150 MG capsule [Pharmacy Med Name: PREGABALIN 150 MG CAPSULE] 270 capsule 0    Sig: TAKE 1 CAPSULE BY MOUTH THREE TIMES A DAY     Not Delegated - Neurology:  Anticonvulsants - Controlled - pregabalin Failed - 04/04/2023 10:10 AM      Failed - This refill cannot be delegated      Failed - Cr in normal range and within 360 days    Creat  Date Value Ref Range Status  02/18/2023 1.11 (H) 0.50 - 1.05 mg/dL Final   Creatinine,U  Date Value Ref Range Status  03/12/2019 131.0 mg/dL Final   Creatinine, Urine  Date Value Ref Range Status  02/18/2023 251 20 - 275 mg/dL Final         Passed - Completed PHQ-2 or PHQ-9 in the last 360 days      Passed - Valid encounter within last 12 months    Recent Outpatient Visits           1 month ago Encounter for annual general medical examination with abnormal findings in adult   [facility] [place], [place] W, NP   9 months ago Type 2 diabetes mellitus with diabetic mononeuropathy, without long-term current use of insulin [facility])   [facility] [place], Salvadore Oxford, NP   1 year ago Screening for colon cancer   [facility] [place], Salvadore Oxford, NP   1 year ago Opioid-induced constipation (OIC)   [facility] [place], Salvadore Oxford, NP   1 year ago Preop general physical exam   [facility] [place], Oswaldo Conroy, PA-C       Future Appointments             In 6 days Sharlene Dory, PA-C [employer] [facility] [employer], Administrator   In 4 months [place], Salvadore [place], NP [facility]

## 2023-04-10 ENCOUNTER — Ambulatory Visit: Payer: 59 | Attending: Cardiology | Admitting: Physician Assistant

## 2023-04-10 ENCOUNTER — Encounter: Payer: Self-pay | Admitting: Physician Assistant

## 2023-04-10 VITALS — BP 108/64 | HR 76 | Ht 69.0 in | Wt 188.0 lb

## 2023-04-10 DIAGNOSIS — E7800 Pure hypercholesterolemia, unspecified: Secondary | ICD-10-CM

## 2023-04-10 DIAGNOSIS — I6523 Occlusion and stenosis of bilateral carotid arteries: Secondary | ICD-10-CM

## 2023-04-10 DIAGNOSIS — I2510 Atherosclerotic heart disease of native coronary artery without angina pectoris: Secondary | ICD-10-CM | POA: Diagnosis not present

## 2023-04-10 DIAGNOSIS — I951 Orthostatic hypotension: Secondary | ICD-10-CM

## 2023-04-10 MED ORDER — EZETIMIBE 10 MG PO TABS: 10.0000 mg | ORAL_TABLET | Freq: Every day | ORAL | 3 refills | Status: AC

## 2023-04-10 NOTE — Progress Notes (Signed)
Office Visit    Patient Name: Melinda [name] Date of Encounter: 04/10/2023  PCP:  Lorre Munroe, NP   [facility]  Cardiologist:  Donato Schultz, MD  Advanced Practice Provider:  No care team member to display Electrophysiologist:  None   HPI    Melinda [name] is a 67 y.o. female with a past medical history of CAD, HLD, DM, carotid disease, aortic atherosclerosis, CKD, OSA, WPW syndrome, orthostatic hypotension, and fibromyalgia presents today for follow-up.  She was seen 01/18/2022 and continued to have severe episodes of orthostasis.  Her midodrine was increased to 3 times daily at that time.  She continued on Florinef 0.1 mg daily.  She was last seen 05/02/2022 and at that time she was overall doing well.  She had a few episodes of orthostasis but overall this had improved significantly.  She reported episodes of chest pain which radiated into her neck.  She felt that this was related to indigestion.  The pain occurred randomly with no clear exertional triggers.  Patient denied chest pain/pressure, dyspnea, PND, orthopnea, leg edema.  Denied dizziness or lightheadedness, syncope or presyncope.  She stated she been weaning off of her Percocet.  She reports that she has been on narcotics since 1994 but would like to be off them completely.  She was followed by pain management.  She saw me 10/26/22,she states that moments after rising as she gets lightheaded but this is significantly improved.  Now that she is in her 101s she wants to find her passion and her passion is underwater.  Monday she has been off her midodrine.  She has been to the [facility] and is up to swimming 44 laps in the pool underwater with a snorkel.  After that she was able to walk a mile.  She is lost over 30 pounds which I congratulated her on.  She still does struggle with a lack of energy at times.  She states that every dives she will be with someone once she is certified.  She really hopes that Dr.  Anne Fu will allow her to participate in diving lessons.  She wanted to drive and she made it through the pool sessions. She was strapped to weights in a quarry that was slippery and gross .  She did not go back and has been to the pool once since that experience. She used to work third shift and usually gets up around 9am and has not been able to find a good time to workout. We discussed walking or biking, something for cardiovascular exercise.  She had not had any lightheadedness or dizziness but did have a mild episode last night that went away quickly.  She will be due for a carotid ultrasound April 2025.  Reports no shortness of breath nor dyspnea on exertion. Reports no chest pain, pressure, or tightness. No edema, orthopnea, PND. Reports no palpitations.    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 b.) Doppler 06/28/2015: 40-59% BILATERAL ICA. c.) Dopplers 04/23/2019, 04/22/2020, 10/20/2021: 40-59% RICA and 1-39% LICA.   Blue toes    2nd toe on right foot, will get appt.  Bulging disc    CAD (coronary artery disease) 2009   a.) PCI with placement of a 15 x 30 mm Promus DES to mLAD. b.) LHC 02/27/2011: EF 60%; no sig CAD; mild lum irreg at ostium of diagnoal (jailed by stent).   Chicken pox    Chronic fatigue    Chronic, continuous use of opioids    CKD (chronic kidney disease), stage III (HCC)    COVID 02/22/2022   Degenerative disc disease    Depression    Diverticulosis    Facet joint disease    Failed cervical fusion    Fibromyalgia    Hiatal hernia    Hyperlipidemia    IBS (irritable bowel syndrome)    MCL deficiency, knee    MRSA (methicillin  resistant staph aureus) culture positive 2011   GREAT TOE RIGHT FOOT   Neuropathy 04/18/2010   Orthostatic hypotension 01/2020   a.) multiple pre-syncopal episodes; takes proamatine   OSA on CPAP 08/17/2003   Osteoarthritis    a.) back, neck, hands, knees   Restless leg syndrome    Sepsis (HCC) 09/2013   Spinal headache 11/07/2021   Spinal stenosis    T2DM (type 2 diabetes mellitus) (HCC)    Wolff-Parkinson-White (WPW) syndrome 1992   a.) s/p ablations; unsuccessful. Now has rare episodes   Past Surgical History:  Procedure Laterality Date   AMPUTATION TOE Right 02/01/2017   Procedure: AMPUTATION TOE-RIGHT 2ND MPJ;  Surgeon: Recardo Evangelist, DPM;  Location: [facility] ORS;  Service: Podiatry;  Laterality: Right;   ANTERIOR CERVICAL DECOMP/DISCECTOMY FUSION N/A 07/28/2014   Procedure: ANTERIOR CERVICAL DECOMPRESSION/DISCECTOMY FUSION CERVICAL 3-4,4-5,5-6 LEVELS WITH INSTRUMENTATION AND ALLOGRAFT;  Surgeon: Emilee Hero, MD;  Location[facility] OR;  Service: Orthopedics;  Laterality: N/A;  Anterior cervical decompression fusion, cervical 3-4, cervical 4-5, cervical 5-6 with instrumentation and allograft   BACK SURGERY     X 3 1979, 1994, 1995   BLEPHAROPLASTY Bilateral 2013   CARDIAC ELECTROPHYSIOLOGY STUDY AND ABLATION N/A 1992   CARPAL TUNNEL RELEASE Right    CHOLECYSTECTOMY  2003   COLONOSCOPY WITH PROPOFOL N/A 02/22/2022   Procedure: COLONOSCOPY WITH PROPOFOL;  Surgeon: Wyline Mood, MD;  Location: [facility] ENDOSCOPY;  Service: Gastroenterology;  Laterality: N/A;   CORONARY ANGIOPLASTY WITH STENT PLACEMENT N/A 2009   Procedure: CORONARY ANGIOPLASTY WITH STENT PLACEMENT   ENDOMETRIAL ABLATION  2007   EXCISION PARTIAL PHALANX Left 09/28/2021   Procedure: EXCISION PARTIAL PHALANX - FIRST;  Surgeon: Rosetta Posner, DPM;  Location: [facility];  Service: Podiatry;  Laterality: Left;   GALLBLADDER SURGERY     HAMMER TOE SURGERY Right 10/17/2017   Procedure: HAMMER TOE CORRECTION-4TH  TOE;  Surgeon: Recardo Evangelist, DPM;  Location: [facility];  Service: Podiatry;  Laterality: Right;  LMA- WITH LOCAL Diabetic - diet controlled   HAND SURGERY Left    INFUSION PUMP IMPLANTATION     X2 with morphine and baclofen   INTRATHECAL PUMP REVISION N/A 04/25/2018   Procedure: Intrathecal pump replacement;  Surgeon: Odette Fraction, MD;  Location: [facility] OR;  Service: Neurosurgery;  Laterality: N/A;  right   INTRATHECAL PUMP REVISION Right 04/25/2018   INTRATHECAL PUMP REVISION N/A 12/12/2018   Procedure: Intrathecal pump revision with exploration of pocket;  Surgeon: Odette Fraction, MD;  Location: [facility] OR;  Service: Neurosurgery;  Laterality: N/A;  Intrathecal pump revision with exploration of pocket   INTRATHECAL PUMP REVISION N/A 03/11/2020   Procedure: INTRATHECAL PUMP REVISION;  Surgeon: Delano Metz, MD;  Location: [facility] ORS;  Service: Neurosurgery;  Laterality: N/A;   INTRATHECAL PUMP REVISION Right 12/14/2021   Procedure: INTRATHECAL PUMP REMOVAL;  Surgeon: Delano Metz, MD;  Location: [facility] ORS;  Service: Neurosurgery;  Laterality: Right;   IRRIGATION AND DEBRIDEMENT FOOT Right 02/23/2017   Procedure: IRRIGATION AND DEBRIDEMENT FOOT-right foot;  Surgeon: Gwyneth Revels, DPM;  Location: [facility] ORS;  Service: Podiatry;  Laterality: Right;   KNEE SURGERY Right 05/24/2020   LEFT HEART CATH AND CORONARY ANGIOGRAPHY Left 02/27/2011   Procedure: LEFT HEART CATH AND CORONARY ANGIOGRAPHY   PAIN PUMP REVISION N/A 08/15/2018   Procedure: Intrathecal PUMP REVISION;  Surgeon: Odette Fraction, MD;  Location: [facility] OR;  Service: Neurosurgery;  Laterality: N/A;  INTRATHECAL PUMP REVISION   PLANTAR FASCIA SURGERY Bilateral    TOE AMPUTATION Right    RIGHT great toe (1st digit)   TOE AMPUTATION Right    a.) RIGHT second toe (2nd digit)   TOE SURGERY     then revision 8/18   TOTAL KNEE ARTHROPLASTY Right 05/24/2020   Procedure: RIGHT TOTAL KNEE ARTHROPLASTY;  Surgeon: Marcene Corning, MD;   Location: [facility] ORS;  Service: Orthopedics;  Laterality: Right;   TOTAL KNEE REVISION Right 09/06/2020   : RIGHT KNEE POLY REVISION;  Marcene Corning, MD) PLAN TO DISCHARGE FROM PACU   WOUND DEBRIDEMENT Left 09/28/2021   Procedure: DEBRIDEMENT SKIN, SUBCUTANEOUS TISSUE - 11042;  Surgeon: Rosetta Posner, DPM;  Location: [facility];  Service: Podiatry;  Laterality: Left;  Diabetic    Allergies  No Known Allergies   EKGs/Labs/Other Studies Reviewed:   The following studies were reviewed today: Cardiac Studies & Procedures     STRESS TESTS  MYOCARDIAL PERFUSION IMAGING 10/12/2021  Narrative   Findings are consistent with no prior ischemia. The study is low risk.   No ST deviation was noted.   Left ventricular function is normal. Nuclear stress EF: 69 %. The left ventricular ejection fraction is hyperdynamic (>65%). End diastolic cavity size is normal.   Prior study available for comparison from 06/04/2019.  Findings: Negative for stress induced arrhythmias. No evidence of ischemia. Small apical perfusion defect in rest and stress with normal wall motion likely related to apical thinning.  Conclusions: Negative stress test. Low risk study. No significant change from prior.   ECHOCARDIOGRAM  ECHOCARDIOGRAM COMPLETE 04/22/2019  Narrative ECHOCARDIOGRAM REPORT    Patient Name:   ISATOU SHEPP Date of Exam: 04/22/2019 Medical Rec #:  [id number]     Height:       70.0 in Accession #:    [id number]    Weight:       217.0 lb Date of Birth:  06/28/1956     BSA:          2.16 m Patient Age:    63 years      BP:           110/54 mmHg Patient Gender: F             HR:           66 bpm. Exam Location:  [address]  Procedure: 2D Echo, Cardiac Doppler and Color Doppler  Indications:    I25.10 CAD.  History:        Patient has prior history of Echocardiogram examinations, most recent 09/12/2015. CAD Risk Factors:Hypertension, Dyslipidemia, Sleep Apnea, Diabetes and  Former Smoker. CKD stage 3.  Sonographer:    Garald Braver, RDCS Referring Phys: [id number] LORI C [place]  IMPRESSIONS   1. Left ventricular ejection fraction, by  visual estimation, is 60 to 65%. The left ventricle has normal function. Normal left ventricular size. There is no left ventricular hypertrophy. Normal wall motion. Normal diastolic function. 2. Global right ventricle has normal systolic function.The right ventricular size is normal. No increase in right ventricular wall thickness. 3. Left atrial size was normal. 4. Right atrial size was normal. 5. The mitral valve is normal in structure. No evidence of mitral valve regurgitation. No evidence of mitral stenosis. 6. The tricuspid valve is normal in structure. Tricuspid valve regurgitation is trivial. 7. The aortic valve is tricuspid Aortic valve regurgitation was not visualized by color flow Doppler. Structurally normal aortic valve, with no evidence of sclerosis or stenosis. 8. The tricuspid regurgitant velocity is 2.59 m/s, and with an assumed right atrial pressure of 8 mmHg, the estimated right ventricular systolic pressure is mildly elevated at 34.8 mmHg. 9. The inferior vena cava is normal in size with greater than 50% respiratory variability, suggesting right atrial pressure of 3 mmHg.  In comparison to the previous echocardiogram(s): 09/12/15 EF 65-70%. FINDINGS Left Ventricle: Left ventricular ejection fraction, by visual estimation, is 60 to 65%. The left ventricle has normal function. No evidence of left ventricular regional wall motion abnormalities. There is no left ventricular hypertrophy. Normal left ventricular size. Spectral Doppler shows Left ventricular diastolic parameters were normal pattern of LV diastolic filling.  Right Ventricle: The right ventricular size is normal. No increase in right ventricular wall thickness. Global RV systolic function is has normal systolic function. The tricuspid regurgitant velocity is 2.59  m/s, and with an assumed right atrial pressure of 8 mmHg, the estimated right ventricular systolic pressure is mildly elevated at 34.8 mmHg.  Left Atrium: Left atrial size was normal in size.  Right Atrium: Right atrial size was normal in size  Pericardium: There is no evidence of pericardial effusion.  Mitral Valve: The mitral valve is normal in structure. No evidence of mitral valve stenosis by observation. No evidence of mitral valve regurgitation.  Tricuspid Valve: The tricuspid valve is normal in structure. Tricuspid valve regurgitation is trivial by color flow Doppler.  Aortic Valve: The aortic valve is tricuspid. Aortic valve regurgitation was not visualized by color flow Doppler. The aortic valve is structurally normal, with no evidence of sclerosis or stenosis.  Pulmonic Valve: The pulmonic valve was normal in structure. Pulmonic valve regurgitation is not visualized by color flow Doppler.  Aorta: The aortic root is normal in size and structure.  Venous: The inferior vena cava is normal in size with greater than 50% respiratory variability, suggesting right atrial pressure of 3 mmHg.  IAS/Shunts: No atrial level shunt detected by color flow Doppler.    LEFT VENTRICLE PLAX 2D LVIDd:         5.40 cm  Diastology LVIDs:         3.50 cm  LV e' lateral:   7.70 cm/s LV PW:         0.80 cm  LV E/e' lateral: 10.8 LV IVS:        0.90 cm  LV e' medial:    7.87 cm/s LVOT diam:     2.00 cm  LV E/e' medial:  10.5 LV SV:         90 ml LV SV Index:   40.52 LVOT Area:     3.14 cm   RIGHT VENTRICLE RV Basal diam:  2.70 cm RV S prime:     16.30 cm/s TAPSE (M-mode): 2.2 cm RVSP:  34.8 mmHg  LEFT ATRIUM             Index       RIGHT ATRIUM           Index LA diam:        4.70 cm 2.17 cm/m  RA Pressure: 8.00 mmHg LA Vol (A2C):   54.9 ml 25.41 ml/m RA Area:     15.30 cm LA Vol (A4C):   37.5 ml 17.35 ml/m RA Volume:   36.60 ml  16.94 ml/m LA Biplane Vol: 48.6 ml 22.49  ml/m AORTIC VALVE LVOT Vmax:   112.00 cm/s LVOT Vmean:  79.800 cm/s LVOT VTI:    0.266 m  AORTA Ao Root diam: 3.50 cm Ao Asc diam:  3.50 cm  MITRAL VALVE                        TRICUSPID VALVE TR Peak grad:   26.8 mmHg TR Vmax:        259.00 cm/s MV Decel Time: 225 msec             Estimated RAP:  8.00 mmHg MV E velocity: 82.90 cm/s 103 cm/s  RVSP:           34.8 mmHg MV A velocity: 83.30 cm/s 70.3 cm/s MV E/A ratio:  1.00       1.5       SHUNTS Systemic VTI:  0.27 m Systemic Diam: 2.00 cm   Marca Ancona MD Electronically signed by Marca Ancona MD Signature Date/Time: 04/22/2019/8:48:06 PM    Final              EKG:  EKG is  ordered today.  The ekg ordered today demonstrates normal sinus rhythm, rate 77 bpm  Recent Labs: 02/18/2023: ALT 23; BUN 29; Creat 1.11; Hemoglobin 12.8; Platelets 223; Potassium 4.5; Sodium 140  Recent Lipid Panel    Component Value Date/Time   CHOL 112 02/18/2023 1413   TRIG 144 02/18/2023 1413   HDL 44 (L) 02/18/2023 1413   CHOLHDL 2.5 02/18/2023 1413   VLDL 31.6 03/12/2019 1255   LDLCALC 45 02/18/2023 1413   LDLDIRECT 63.0 11/04/2017 1326    Home Medications   Current Meds  Medication Sig   ACCU-CHEK GUIDE test strip USE 1 EACH BY OTHER ROUTE 3 (THREE) TIMES DAILY. USE AS INSTRUCTE   Accu-Chek Softclix Lancets lancets USE TO CHECK BLOOD SUGAR 1 TIME DAILY   Ascorbic Acid (VITAMIN C) 1000 MG tablet Take 1,000 mg by mouth daily.   ASPIRIN 81 PO Take 81 mg by mouth daily.   Biotin 84696 MCG TABS Take 10,000 mcg by mouth daily.   Blood Glucose Calibration (ACCU-CHEK GUIDE CONTROL) LIQD 1 each by In Vitro route as needed.   buPROPion (WELLBUTRIN XL) 150 MG 24 hr tablet TAKE 1 TABLET BY MOUTH EVERY DAY   CALCIUM-MAGNESIUM-ZINC PO Take 3 capsules by mouth daily.   CINNAMON PO Take 1,000 mg by mouth daily.   Coenzyme Q10 (CO Q 10 PO) Take 1 tablet by mouth daily.   fluticasone (FLONASE) 50 MCG/ACT nasal spray Place 2 sprays into both  nostrils daily.   glucosamine-chondroitin 500-400 MG tablet Take 2 tablets by mouth daily.    nitroGLYCERIN (NITROSTAT) 0.4 MG SL tablet PLACE 1 TABLET UNDER THE TONGUE EVERY 5 (FIVE) MINUTES AS NEEDED FOR CHEST PAIN.   Omega-3 Fatty Acids (FISH OIL) 1200 MG CAPS Take 1,200 mg by mouth in the morning.  PARoxetine (PAXIL) 20 MG tablet TAKE 1/2 TABLET BY MOUTH DAILY IN THE MORNING   pregabalin (LYRICA) 150 MG capsule TAKE 1 CAPSULE BY MOUTH THREE TIMES A DAY   rosuvastatin (CRESTOR) 20 MG tablet TAKE 1 TABLET BY MOUTH EVERY DAY   tirzepatide (MOUNJARO) 5 MG/0.5ML Pen Inject 5 mg into the skin once a week.   tiZANidine (ZANAFLEX) 2 MG tablet Take 1 tablet (2 mg total) by mouth in the morning and at bedtime.   vitamin E 180 MG (400 UNITS) capsule Take 400 Units by mouth daily.   [DISCONTINUED] ezetimibe (ZETIA) 10 MG tablet TAKE 1 TABLET BY MOUTH EVERY DAY     Review of Systems      All other systems reviewed and are otherwise negative except as noted above.  Physical Exam    VS:  BP 108/64   Pulse 76   Ht 5\' 9"  (1.753 m)   Wt 188 lb (85.3 kg)   LMP  (LMP Unknown)   SpO2 96%   BMI 27.76 kg/m  , BMI Body mass index is 27.76 kg/m.  Wt Readings from Last 3 Encounters:  04/10/23 188 lb (85.3 kg)  03/19/23 190 lb (86.2 kg)  02/18/23 193 lb (87.5 kg)     GEN: Well nourished, well developed, in no acute distress. HEENT: normal. Neck: Supple, no JVD, carotid bruits, or masses. Cardiac: RRR, no murmurs, rubs, or gallops. No clubbing, cyanosis, edema.  Radials/PT 2+ and equal bilaterally.  Respiratory:  Respirations regular and unlabored, clear to auscultation bilaterally. GI: Soft, nontender, nondistended. MS: No deformity or atrophy. Skin: Warm and dry, no rash. Neuro:  Strength and sensation are intact. Psych: Normal affect.  Assessment & Plan    CAD -No chest pains or shortness of breath -Continue current medication regimen which includes Zetia 10 mg daily, nitro as needed,  fish oil 1200 mg daily, Crestor 20 mg daily, and aspirin 81 mg daily  Hypotensive orthostasis -Blood pressure is stable today off midodrine, 108/64 -Her symptoms have resolved and she feels much better she is able to increase her activity  Hypercholesterolemia -Her LDL 45, triglycerides are 811 -Encouraged to continue current medication regimen -She will be due for repeat labs March of next year  Carotid artery stenosis bilateral-mild 1-39% -Most recent ultrasound reviewed with the patient where she had mild bilateral carotid artery stenosis -No bruit on exam -Stable         Disposition: Follow up 1 year with Donato Schultz, MD or APP.  Signed, Sharlene Dory, PA-C 04/10/2023, 4:31 PM [facility]

## 2023-04-10 NOTE — Patient Instructions (Addendum)
Medication Instructions:  Your physician recommends that you continue on your current medications as directed. Please refer to the Current Medication list given to you today.  *If you need a refill on your cardiac medications before your next appointment, please call your pharmacy*  Lab Work: None ordered today.  Testing/Procedures: None ordered today.  Follow-Up: At [facility], you and your health needs are our priority.  As part of our continuing mission to provide you with exceptional heart care, we have created designated Provider Care Teams.  These Care Teams include your primary Cardiologist (physician) and Advanced Practice Providers (APPs -  Physician Assistants and Nurse Practitioners) who all work together to provide you with the care you need, when you need it.  Your next appointment:   1 year(s)  The format for your next appointment:   In Person  Provider:   Donato Schultz, MD {  Other Instructions HOW TO TAKE YOUR BLOOD PRESSURE Rest 5 minutes before taking your blood pressure. Don't  smoke or drink caffeinated beverages for at least 30 minutes before. Take your blood pressure before (not after) you eat. Sit comfortably with your back supported and both feet on the floor ( don't cross your legs). Elevate your arm to heart level on a table or a desk. Use the proper sized cuff.  It should fit smoothly and snugly around your bare upper arm. There should be enough room to slip a fingertip under the cuff.  The bottom edge of the cuff should be 1 inch above the crease of the elbow.  Please monitor your blood pressure once daily 2 hours after your am medication. If you blood pressure consistently remains above 140 (systolic) top number or over 90 ( diastolic) bottom number X 3 days consecutively.  Please call our office at [phone] or send Mychart message.

## 2023-04-28 ENCOUNTER — Other Ambulatory Visit: Payer: Self-pay | Admitting: Internal Medicine

## 2023-04-30 NOTE — Telephone Encounter (Signed)
Requested Prescriptions  Pending Prescriptions Disp Refills   PARoxetine (PAXIL) 20 MG tablet [Pharmacy Med Name: PAROXETINE HCL 20 MG TABLET] 45 tablet 1    Sig: TAKE 1/2 TABLET BY MOUTH DAILY IN THE MORNING     Psychiatry:  Antidepressants - SSRI Passed - 04/28/2023  9:15 AM      Passed - Completed PHQ-2 or PHQ-9 in the last 360 days      Passed - Valid encounter within last 6 months    Recent Outpatient Visits           2 months ago Encounter for annual general medical examination with abnormal findings in adult   [place] [facility] [place] [place], Salvadore Oxford, NP   10 months ago Type 2 diabetes mellitus with diabetic mononeuropathy, without long-term current use of insulin [facility])   [place] [facility] [place], [place], NP   1 year ago Screening for colon cancer   [place] [facility] [place], Salvadore Oxford, NP   1 year ago Opioid-induced constipation (OIC)   [place] [facility] [place], Salvadore Oxford, NP   1 year ago Preop general physical exam   [place] [facility] [place], Oswaldo Conroy, PA-C       Future Appointments             In 3 months Baity, Salvadore Oxford, NP [place] [facility], [facility]             buPROPion (WELLBUTRIN XL) 150 MG 24 hr tablet [Pharmacy Med Name: BUPROPION HCL XL 150 MG TABLET] 90 tablet 1    Sig: TAKE 1 TABLET BY MOUTH EVERY DAY     Psychiatry: Antidepressants - bupropion Failed - 04/28/2023  9:15 AM      Failed - Cr in normal range and within 360 days    Creat  Date Value Ref Range Status  02/18/2023 1.11 (H) 0.50 - 1.05 mg/dL Final   Creatinine,U  Date Value Ref Range Status  03/12/2019 131.0 mg/dL Final   Creatinine, Urine  Date Value Ref Range Status  02/18/2023 251 20 - 275 mg/dL Final         Passed - AST in normal range and within 360 days    AST  Date Value Ref Range Status  02/18/2023 29 10 - 35 U/L Final         Passed - ALT in normal range  and within 360 days    ALT  Date Value Ref Range Status  02/18/2023 23 6 - 29 U/L Final         Passed - Completed PHQ-2 or PHQ-9 in the last 360 days      Passed - Last BP in normal range    BP Readings from Last 1 Encounters:  04/10/23 108/64         Passed - Valid encounter within last 6 months    Recent Outpatient Visits           2 months ago Encounter for annual general medical examination with abnormal findings in adult   [facility] [place], [place], NP   10 months ago Type 2 diabetes mellitus with diabetic mononeuropathy, without long-term current use of insulin [facility])   [place] [facility] [place], Salvadore Oxford, NP   1 year ago Screening for colon cancer   [facility] [place], Salvadore Oxford, NP  1 year ago Opioid-induced constipation (OIC)   [facility] [place], Salvadore Oxford, NP   1 year ago Preop general physical exam   [place] [facility] [place], Oswaldo Conroy, PA-C       Future Appointments             In 3 months Baity, Salvadore Oxford, NP [facility] [place]

## 2023-05-03 ENCOUNTER — Ambulatory Visit
Admission: RE | Admit: 2023-05-03 | Discharge: 2023-05-03 | Disposition: A | Discharge status: Home / Self Care | Payer: 59 | Source: Ambulatory Visit | Attending: Internal Medicine | Admitting: Internal Medicine

## 2023-05-03 DIAGNOSIS — Z1231 Encounter for screening mammogram for malignant neoplasm of breast: Secondary | ICD-10-CM

## 2023-05-08 ENCOUNTER — Other Ambulatory Visit: Payer: Self-pay | Admitting: Internal Medicine

## 2023-05-08 DIAGNOSIS — R928 Other abnormal and inconclusive findings on diagnostic imaging of breast: Secondary | ICD-10-CM

## 2023-05-09 ENCOUNTER — Ambulatory Visit
Admission: RE | Admit: 2023-05-09 | Discharge: 2023-05-09 | Disposition: A | Discharge status: Home / Self Care | Payer: 59 | Source: Ambulatory Visit | Attending: Internal Medicine | Admitting: Internal Medicine

## 2023-05-09 DIAGNOSIS — N6452 Nipple discharge: Secondary | ICD-10-CM | POA: Diagnosis not present

## 2023-05-09 DIAGNOSIS — R928 Other abnormal and inconclusive findings on diagnostic imaging of breast: Secondary | ICD-10-CM

## 2023-05-22 DIAGNOSIS — Z89421 Acquired absence of other right toe(s): Secondary | ICD-10-CM | POA: Diagnosis not present

## 2023-05-22 DIAGNOSIS — E1142 Type 2 diabetes mellitus with diabetic polyneuropathy: Secondary | ICD-10-CM | POA: Diagnosis not present

## 2023-05-22 DIAGNOSIS — B351 Tinea unguium: Secondary | ICD-10-CM | POA: Diagnosis not present

## 2023-05-22 DIAGNOSIS — L851 Acquired keratosis [keratoderma] palmaris et plantaris: Secondary | ICD-10-CM | POA: Diagnosis not present

## 2023-05-23 ENCOUNTER — Other Ambulatory Visit: Payer: 59

## 2023-05-24 ENCOUNTER — Other Ambulatory Visit: Payer: Self-pay | Admitting: Cardiology

## 2023-06-19 DIAGNOSIS — G4733 Obstructive sleep apnea (adult) (pediatric): Secondary | ICD-10-CM | POA: Diagnosis not present

## 2023-07-05 ENCOUNTER — Other Ambulatory Visit: Payer: Self-pay | Admitting: Internal Medicine

## 2023-07-05 DIAGNOSIS — M797 Fibromyalgia: Secondary | ICD-10-CM

## 2023-07-09 NOTE — Telephone Encounter (Signed)
 Requested medication (s) are due for refill today - yes  Requested medication (s) are on the active medication list -yes  Future visit scheduled -yes  Last refill: 04/05/23 #270  Notes to clinic: non delegated Rx  Requested Prescriptions  Pending Prescriptions Disp Refills   pregabalin  (LYRICA ) 150 MG capsule [Pharmacy Med Name: PREGABALIN  150 MG CAPSULE] 270 capsule 0    Sig: TAKE 1 CAPSULE BY MOUTH THREE TIMES A DAY     Not Delegated - Neurology:  Anticonvulsants - Controlled - pregabalin  Failed - 07/09/2023  9:17 AM      Failed - This refill cannot be delegated      Failed - Cr in normal range and within 360 days    Creat  Date Value Ref Range Status  02/18/2023 1.11 (H) 0.50 - 1.05 mg/dL Final   Creatinine,U  Date Value Ref Range Status  03/12/2019 131.0 mg/dL Final   Creatinine, Urine  Date Value Ref Range Status  02/18/2023 251 20 - 275 mg/dL Final         Passed - Completed PHQ-2 or PHQ-9 in the last 360 days      Passed - Valid encounter within last 12 months    Recent Outpatient Visits           4 months ago Encounter for annual general medical examination with abnormal findings in adult   [facility] [place], [place] W, NP   1 year ago Type 2 diabetes mellitus with diabetic mononeuropathy, without long-term current use of insulin  [facility])   [place] [facility] [employer], Angeline ORN, NP   1 year ago Screening for colon cancer   [facility] [place], Angeline ORN, NP   1 year ago Opioid-induced constipation (OIC)   [place] [facility] [place], Angeline ORN, NP   1 year ago Preop general physical exam   [place] [facility] Mecum, Rocky BRAVO, PA-C       Future Appointments             In 1 month Baity, Angeline ORN, NP [facility], [facility]               Requested Prescriptions  Pending Prescriptions Disp Refills   pregabalin  (LYRICA ) 150 MG  capsule [Pharmacy Med Name: PREGABALIN  150 MG CAPSULE] 270 capsule 0    Sig: TAKE 1 CAPSULE BY MOUTH THREE TIMES A DAY     Not Delegated - Neurology:  Anticonvulsants - Controlled - pregabalin  Failed - 07/09/2023  9:17 AM      Failed - This refill cannot be delegated      Failed - Cr in normal range and within 360 days    Creat  Date Value Ref Range Status  02/18/2023 1.11 (H) 0.50 - 1.05 mg/dL Final   Creatinine,U  Date Value Ref Range Status  03/12/2019 131.0 mg/dL Final   Creatinine, Urine  Date Value Ref Range Status  02/18/2023 251 20 - 275 mg/dL Final         Passed - Completed PHQ-2 or PHQ-9 in the last 360 days      Passed - Valid encounter within last 12 months    Recent Outpatient Visits           4 months ago Encounter for annual general medical examination with abnormal findings in adult   [facility] [place], Angeline ORN, [place]  1 year ago Type 2 diabetes mellitus with diabetic mononeuropathy, without long-term current use of insulin  [facility])   [place] [place], Angeline ORN, NP   1 year ago Screening for colon cancer   [place] [facility] [place], Angeline ORN, NP   1 year ago Opioid-induced constipation (OIC)   [place] [facility] [place], Angeline ORN, NP   1 year ago Preop general physical exam   [place] [facility] Mecum, Rocky BRAVO, PA-C       Future Appointments             In 1 month Baity, Angeline ORN, NP [place] [facility], [facility]

## 2023-07-23 ENCOUNTER — Encounter: Payer: Self-pay | Admitting: Internal Medicine

## 2023-07-24 ENCOUNTER — Other Ambulatory Visit: Payer: Self-pay

## 2023-07-24 MED ORDER — TIRZEPATIDE 2.5 MG/0.5ML ~~LOC~~ SOAJ: 2.5000 mg | SUBCUTANEOUS | 0 refills | Status: DC

## 2023-07-27 ENCOUNTER — Other Ambulatory Visit: Payer: Self-pay | Admitting: Internal Medicine

## 2023-07-29 NOTE — Telephone Encounter (Signed)
Requested medications are due for refill today.  A little too soon  Requested medications are on the active medications list.  yes  Last refill. 02/18/2023 #180 1 rf  Future visit scheduled.   yes  Notes to clinic.  Refill not delegated.    Requested Prescriptions  Pending Prescriptions Disp Refills   tiZANidine (ZANAFLEX) 2 MG tablet [Pharmacy Med Name: TIZANIDINE HCL 2 MG TABLET] 180 tablet 1    Sig: Take 1 tablet (2 mg total) by mouth in the morning and at bedtime.     Not Delegated - Cardiovascular:  Alpha-2 Agonists - tizanidine Failed - 07/29/2023  2:20 PM      Failed - This refill cannot be delegated      Passed - Valid encounter within last 6 months    Recent Outpatient Visits           5 months ago Encounter for annual general medical examination with abnormal findings in adult   [facility] [place], [place] W, NP   1 year ago Type 2 diabetes mellitus with diabetic mononeuropathy, without long-term current use of insulin [facility])   [place] [facility] [place], Salvadore [place], NP   1 year ago Screening for colon cancer   [place] [facility] [place], Salvadore Oxford, NP   1 year ago Opioid-induced constipation (OIC)   [place] [facility] [place], Salvadore Oxford, NP   1 year ago Preop general physical exam   [place] [facility] Mecum, Oswaldo Conroy, PA-C       Future Appointments             In 3 weeks [place], Salvadore Oxford, NP [place] [facility], [facility]

## 2023-08-22 ENCOUNTER — Ambulatory Visit: Payer: Self-pay | Admitting: Internal Medicine

## 2023-08-23 DIAGNOSIS — L851 Acquired keratosis [keratoderma] palmaris et plantaris: Secondary | ICD-10-CM | POA: Diagnosis not present

## 2023-08-23 DIAGNOSIS — E1142 Type 2 diabetes mellitus with diabetic polyneuropathy: Secondary | ICD-10-CM | POA: Diagnosis not present

## 2023-08-23 DIAGNOSIS — B351 Tinea unguium: Secondary | ICD-10-CM | POA: Diagnosis not present

## 2023-08-23 DIAGNOSIS — Z89421 Acquired absence of other right toe(s): Secondary | ICD-10-CM | POA: Diagnosis not present

## 2023-08-24 ENCOUNTER — Other Ambulatory Visit: Payer: Self-pay | Admitting: Internal Medicine

## 2023-08-27 ENCOUNTER — Encounter: Payer: Self-pay | Admitting: Internal Medicine

## 2023-08-27 NOTE — Telephone Encounter (Signed)
 Requested medications are due for refill today.  Unsure  Requested medications are on the active medications list.  no  Last refill. Unknown - see note  Future visit scheduled.   yes  Notes to clinic.  Dosage on med list different than that on refill request.    Requested Prescriptions  Pending Prescriptions Disp Refills   MOUNJARO 2.5 MG/0.5ML Pen [Pharmacy Med Name: MOUNJARO 2.5 MG/0.5 ML PEN]      Sig: INJECT 2.5 MG SUBCUTANEOUSLY WEEKLY     Off-Protocol Failed - 08/27/2023  7:59 AM      Failed - Medication not assigned to a protocol, review manually.      Passed - Valid encounter within last 12 months    Recent Outpatient Visits           6 months ago Encounter for annual general medical examination with abnormal findings in adult   [facility] [place], [place] W, NP   1 year ago Type 2 diabetes mellitus with diabetic mononeuropathy, without long-term current use of insulin [facility])   [place] [facility] [place], Salvadore Oxford, NP   1 year ago Screening for colon cancer   [place] [facility] [place], Salvadore Oxford, NP   1 year ago Opioid-induced constipation (OIC)   [place] [facility] [place], Salvadore Oxford, NP   1 year ago Preop general physical exam   [place] [facility] Mecum, Oswaldo Conroy, PA-C       Future Appointments             In 1 month Baity, Salvadore Oxford, NP [place] [facility], [facility]

## 2023-08-29 ENCOUNTER — Other Ambulatory Visit: Payer: Self-pay | Admitting: Internal Medicine

## 2023-08-29 MED ORDER — TIRZEPATIDE 2.5 MG/0.5ML ~~LOC~~ SOAJ: 2.5000 mg | SUBCUTANEOUS | 0 refills | Status: DC

## 2023-09-26 ENCOUNTER — Ambulatory Visit (INDEPENDENT_AMBULATORY_CARE_PROVIDER_SITE_OTHER): Payer: 59 | Admitting: Internal Medicine

## 2023-09-26 ENCOUNTER — Encounter: Payer: Self-pay | Admitting: Internal Medicine

## 2023-09-26 VITALS — BP 98/64 | Ht 69.0 in | Wt 169.4 lb

## 2023-09-26 DIAGNOSIS — E1169 Type 2 diabetes mellitus with other specified complication: Secondary | ICD-10-CM

## 2023-09-26 DIAGNOSIS — M797 Fibromyalgia: Secondary | ICD-10-CM | POA: Diagnosis not present

## 2023-09-26 DIAGNOSIS — N1831 Chronic kidney disease, stage 3a: Secondary | ICD-10-CM | POA: Diagnosis not present

## 2023-09-26 DIAGNOSIS — G4733 Obstructive sleep apnea (adult) (pediatric): Secondary | ICD-10-CM

## 2023-09-26 DIAGNOSIS — E785 Hyperlipidemia, unspecified: Secondary | ICD-10-CM

## 2023-09-26 DIAGNOSIS — E1141 Type 2 diabetes mellitus with diabetic mononeuropathy: Secondary | ICD-10-CM

## 2023-09-26 DIAGNOSIS — I6523 Occlusion and stenosis of bilateral carotid arteries: Secondary | ICD-10-CM | POA: Diagnosis not present

## 2023-09-26 DIAGNOSIS — I2510 Atherosclerotic heart disease of native coronary artery without angina pectoris: Secondary | ICD-10-CM

## 2023-09-26 DIAGNOSIS — Z7985 Long-term (current) use of injectable non-insulin antidiabetic drugs: Secondary | ICD-10-CM

## 2023-09-26 DIAGNOSIS — F418 Other specified anxiety disorders: Secondary | ICD-10-CM

## 2023-09-26 DIAGNOSIS — E663 Overweight: Secondary | ICD-10-CM

## 2023-09-26 DIAGNOSIS — G894 Chronic pain syndrome: Secondary | ICD-10-CM

## 2023-09-26 NOTE — Patient Instructions (Signed)
Myofascial Pain Syndrome and Fibromyalgia Myofascial pain syndrome and fibromyalgia are both pain disorders. You may feel this pain mainly in your muscles. Myofascial pain syndrome: Always has tender points in the muscles that will cause pain when pressed (trigger points). The pain may come and go. Usually affects your neck, upper back, and shoulder areas. The pain often moves into your arms and hands. Fibromyalgia: Has muscle pains and tenderness that come and go. Is often associated with tiredness (fatigue) and sleep problems. Has trigger points. Tends to be long-lasting (chronic), but is not life-threatening. Fibromyalgia and myofascial pain syndrome are not the same. However, they often occur together. If you have both conditions, each can make the other worse. Both are common and can cause enough pain and fatigue to make day-to-day activities difficult. Both can be hard to diagnose because their symptoms are common in many other conditions. What are the causes? The exact causes of these conditions are not known. What increases the risk? You are more likely to develop either of these conditions if: You have a family history of the condition. You are female. You have certain triggers, such as: Spine disorders. An injury (trauma) or other physical stressors. Being under a lot of stress. Medical conditions such as osteoarthritis, rheumatoid arthritis, or lupus. What are the signs or symptoms? Fibromyalgia The main symptom of fibromyalgia is widespread pain and tenderness in your muscles. Pain is sometimes described as stabbing, shooting, or burning. You may also have: Tingling or numbness. Sleep problems and fatigue. Problems with attention and concentration (fibro fog). Other symptoms may include: Bowel and bladder problems. Headaches. Vision problems. Sensitivity to odors and noises. Depression or mood changes. Painful menstrual periods (dysmenorrhea). Dry skin or eyes. These  symptoms can vary over time. Myofascial pain syndrome Symptoms of myofascial pain syndrome include: Tight, ropy bands of muscle. Uncomfortable sensations in muscle areas. These may include aching, cramping, burning, numbness, tingling, and weakness. Difficulty moving certain parts of the body freely (poor range of motion). How is this diagnosed? This condition may be diagnosed by your symptoms and medical history. You will also have a physical exam. In general: Fibromyalgia is diagnosed if you have pain, fatigue, and other symptoms for more than 3 months, and symptoms cannot be explained by another condition. Myofascial pain syndrome is diagnosed if you have trigger points in your muscles, and those trigger points are tender and cause pain elsewhere in your body (referred pain). How is this treated? Treatment for these conditions depends on the type that you have. For fibromyalgia, a healthy lifestyle is the most important treatment including aerobic and strength exercises. Different types of medicines are used to help treat pain and include: NSAIDs. Medicines for treating depression. Medicines that help control seizures. Medicines that relax the muscles. Treatment for myofascial pain syndrome includes: Pain medicines, such as NSAIDs. Cooling and stretching of muscles. Massage therapy with myofascial release technique. Trigger point injections. Treating these conditions often requires a team of health care providers. These may include: Your primary care provider. A physical therapist. Complementary health care providers, such as massage therapists or acupuncturists. A psychiatrist for cognitive behavioral therapy. Follow these instructions at home: Medicines Take over-the-counter and prescription medicines only as told by your health care provider. Ask your health care provider if the medicine prescribed to you: Requires you to avoid driving or using machinery. Can cause constipation.  You may need to take these actions to prevent or treat constipation: Drink enough fluid to keep your urine pale   yellow. Take over-the-counter or prescription medicines. Eat foods that are high in fiber, such as beans, whole grains, and fresh fruits and vegetables. Limit foods that are high in fat and processed sugars, such as fried or sweet foods. Lifestyle  Do exercises as told by your health care provider or physical therapist. Practice relaxation techniques to control your stress. You may want to try: Biofeedback. Visual imagery. Hypnosis. Muscle relaxation. Yoga. Meditation. Maintain a healthy lifestyle. This includes eating a healthy diet and getting enough sleep. Do not use any products that contain nicotine or tobacco. These products include cigarettes, chewing tobacco, and vaping devices, such as e-cigarettes. If you need help quitting, ask your health care provider. General instructions Talk to your health care provider about complementary treatments, such as acupuncture or massage. Do not do activities that stress or strain your muscles. This includes repetitive motions and heavy lifting. Keep all follow-up visits. This is important. Where to find support Consider joining a support group with others who are diagnosed with this condition. National Fibromyalgia Association: fmaware.org Where to find more information [place]. Pain Foundation: uspainfoundation.org Contact a health care provider if: You have new symptoms. Your symptoms get worse or your pain is severe. You have side effects from your medicines. You have trouble sleeping. Your condition is causing depression or anxiety. Get help right away if: You have thoughts of hurting yourself or others. Get help right away if you feel like you may hurt yourself or others, or have thoughts about taking your own life. Go to your nearest emergency room or: Call 911. Call the National Suicide Prevention Lifeline at 1-800-273-8255  or 988. This is open 24 hours a day. Text the Crisis Text Line at 741741. This information is not intended to replace advice given to you by your health care provider. Make sure you discuss any questions you have with your health care provider. Document Revised: 03/26/2022 Document Reviewed: 05/19/2021 Elsevier Patient Education  2024 [employer] Inc.  

## 2023-09-26 NOTE — Assessment & Plan Note (Signed)
C-Met and lipid profile today Encouraged her to consume a low-fat diet Continue rosuvastatin and ezetimibe

## 2023-09-26 NOTE — Assessment & Plan Note (Signed)
 Managed with tizanidine and pregabalin as needed She is no longer following with pain management

## 2023-09-26 NOTE — Assessment & Plan Note (Signed)
 POCT A1c Urine microalbumin has been checked within the last year Encourage low-carb diet and exercise for weight loss Continue Mounjaro and pregabalin Routine eye exam Encourage routine exam Flu and Pneumovax UTD Encouraged her get her COVID booster

## 2023-09-26 NOTE — Progress Notes (Signed)
 Subjective:    Patient ID: Melinda [name], female    DOB: 04-Mar-1956, 68 y.o.   MRN: [id number]  HPI  Patient presents to clinic today for 21-month follow-up of chronic conditions.  OSA: She averages 5-12 hours of sleep per night with the use of her CPAP.  Sleep study from 05/2019 reviewed.  DM2 with peripheral neuropathy: Her last A1c was 5.6%, 01/2023.  She does not check her sugars routinely.  She is taking mounjaro and pregabalin as prescribed.  Her last eye exam was 10/2022.  She checks her feet routinely.  Flu 04/2022.  Pneumovax 06/2021.  Prevnar 01/2023.  COVID Pfizer x 5.  CKD 3: Her last creatinine was 1.11, GFR 55, 01/2023.  She is not currently on an ACEI/ARB.  She follows with nephrology.  HLD with CAD, carotid/aortic atherosclerosis: Her last LDL was 45, triglycerides 440, 01/2019.  She denies myalgias on rosuvastatin and ezetimibe.  She is taking aspirin as well.  She tries to consume a low-fat diet.  Chronic pain/fibromyalgia:  She is taking tizanidine and pregabalin as prescribed. She no longer follows with pain management.  Depression: Chronic, managed with paroxetine and bupropion.  She is not currently seeing a therapist.  She denies SI/HI.   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 b.) Doppler 06/28/2015: 40-59% BILATERAL ICA. c.) Dopplers 04/23/2019, 04/22/2020, 10/20/2021: 40-59% RICA and 1-39% LICA.   Blue toes    2nd toe on right foot, will get appt.   Bulging disc    CAD (coronary artery disease) 2009   a.) PCI with placement of a 15 x 30 mm Promus DES to mLAD. b.) LHC 02/27/2011: EF 60%; no sig CAD; mild lum irreg at ostium of diagnoal  (jailed by stent).   Chicken pox    Chronic fatigue    Chronic, continuous use of opioids    CKD (chronic kidney disease), stage III (HCC)    COVID 02/22/2022   Degenerative disc disease    Depression    Diverticulosis    Facet joint disease    Failed cervical fusion    Fibromyalgia    Hiatal hernia    Hyperlipidemia    IBS (irritable bowel syndrome)    MCL deficiency, knee    MRSA (methicillin resistant staph aureus) culture positive 2011   GREAT TOE RIGHT FOOT   Neuropathy 04/18/2010   Orthostatic hypotension 01/2020   a.) multiple pre-syncopal episodes; takes proamatine   OSA on CPAP 08/17/2003   Osteoarthritis    a.) back, neck, hands, knees   Restless leg syndrome    Sepsis (HCC) 09/2013   Spinal headache 11/07/2021   Spinal stenosis    T2DM (type 2 diabetes mellitus) (HCC)    Wolff-Parkinson-White (WPW) syndrome 1992   a.) s/p ablations; unsuccessful. Now has rare episodes    Current Outpatient Medications  Medication Sig Dispense Refill   ACCU-CHEK GUIDE test strip USE 1 EACH BY OTHER ROUTE 3 (THREE) TIMES DAILY. USE AS INSTRUCTE 200 strip 1   Accu-Chek Softclix Lancets lancets USE TO CHECK BLOOD SUGAR 1 TIME DAILY 100 each 2   Ascorbic Acid (VITAMIN C) 1000 MG  tablet Take 1,000 mg by mouth daily.     ASPIRIN 81 PO Take 81 mg by mouth daily.     Biotin 01027 MCG TABS Take 10,000 mcg by mouth daily.     Blood Glucose Calibration (ACCU-CHEK GUIDE CONTROL) LIQD 1 each by In Vitro route as needed. 1 each 1   buPROPion (WELLBUTRIN XL) 150 MG 24 hr tablet TAKE 1 TABLET BY MOUTH EVERY DAY 90 tablet 1   CALCIUM-MAGNESIUM-ZINC PO Take 3 capsules by mouth daily.     CINNAMON PO Take 1,000 mg by mouth daily.     Coenzyme Q10 (CO Q 10 PO) Take 1 tablet by mouth daily.     ezetimibe (ZETIA) 10 MG tablet Take 1 tablet (10 mg total) by mouth daily. 90 tablet 3   fluticasone (FLONASE) 50 MCG/ACT nasal spray Place 2 sprays into both nostrils daily. 16 g 0    glucosamine-chondroitin 500-400 MG tablet Take 2 tablets by mouth daily.      nitroGLYCERIN (NITROSTAT) 0.4 MG SL tablet PLACE 1 TABLET UNDER THE TONGUE EVERY 5 (FIVE) MINUTES AS NEEDED FOR CHEST PAIN. 25 tablet 4   Omega-3 Fatty Acids (FISH OIL) 1200 MG CAPS Take 1,200 mg by mouth in the morning.      PARoxetine (PAXIL) 20 MG tablet TAKE 1/2 TABLET BY MOUTH DAILY IN THE MORNING 45 tablet 1   pregabalin (LYRICA) 150 MG capsule TAKE 1 CAPSULE BY MOUTH THREE TIMES A DAY 270 capsule 0   rosuvastatin (CRESTOR) 20 MG tablet TAKE 1 TABLET BY MOUTH EVERY DAY 90 tablet 3   tirzepatide (MOUNJARO) 2.5 MG/0.5ML Pen Inject 2.5 mg into the skin once a week. 6 mL 0   tiZANidine (ZANAFLEX) 2 MG tablet TAKE 1 TABLET (2 MG TOTAL) BY MOUTH IN THE MORNING AND AT BEDTIME. 180 tablet 1   vitamin E 180 MG (400 UNITS) capsule Take 400 Units by mouth daily.     No current facility-administered medications for this visit.    No Known Allergies  Family History  Problem Relation Age of Onset   Lung cancer Mother    Arthritis Father    Heart disease Father    Diabetes Father    Dementia Maternal Grandmother    Alcohol abuse Paternal Grandfather    Heart disease Paternal Grandfather    Stroke Paternal Grandfather    BRCA 1/2 Neg Hx    Breast cancer Neg Hx     Social History   Socioeconomic History   Marital status: Significant Other    Spouse name: Not on file   Number of children: Not on file   Years of education: Not on file   Highest education level: GED or equivalent  Occupational History   Not on file  Tobacco Use   Smoking status: Former    Current packs/day: 0.00    Average packs/day: 1.5 packs/day for 32.0 years (48.0 ttl pk-yrs)    Types: Cigarettes    Start date: 07/22/1975    Quit date: 07/22/2007    Years since quitting: 16.1   Smokeless tobacco: Former  Building services engineer status: Never Used  Substance and Sexual Activity   Alcohol use: Not Currently    Comment: occ - Holidays    Drug use: Not Currently    Comment: prescribed pain pump and oxy   Sexual activity: Yes  Other Topics Concern   Not on file  Social History Narrative   Lives at home with a partner. Independent at baseline  Social Drivers of Corporate investment banker Strain: Low Risk  (09/23/2023)   Overall Financial Resource Strain (CARDIA)    Difficulty of Paying Living Expenses: Not very hard  Food Insecurity: No Food Insecurity (09/23/2023)   Hunger Vital Sign    Worried About Running Out of Food in the Last Year: Never true    Ran Out of Food in the Last Year: Never true  Transportation Needs: No Transportation Needs (09/23/2023)   PRAPARE - Administrator, Civil Service (Medical): No    Lack of Transportation (Non-Medical): No  Physical Activity: Insufficiently Active (09/23/2023)   Exercise Vital Sign    Days of Exercise per Week: 1 day    Minutes of Exercise per Session: 30 min  Stress: No Stress Concern Present (09/23/2023)   [employer] of Occupational Health - Occupational Stress Questionnaire    Feeling of Stress : Only a little  Social Connections: Moderately Isolated (09/23/2023)   Social Connection and Isolation Panel [NHANES]    Frequency of Communication with Friends and Family: Once a week    Frequency of Social Gatherings with Friends and Family: Once a week    Attends Religious Services: Never    Database administrator or Organizations: Yes    Attends Engineer, structural: More than 4 times per year    Marital Status: Living with partner  Intimate Partner Violence: Not At Risk (10/12/2022)   Humiliation, Afraid, Rape, and Kick questionnaire    Fear of Current or Ex-Partner: No    Emotionally Abused: No    Physically Abused: No    Sexually Abused: No     Constitutional: Denies fever, malaise, fatigue, headache or abrupt weight changes.  HEENT: Denies eye pain, eye redness, ear pain, ringing in the ears, wax buildup, runny nose, nasal congestion,  bloody nose, or sore throat. Respiratory: Denies difficulty breathing, shortness of breath, cough or sputum production.   Cardiovascular: Denies chest pain, chest tightness, palpitations or swelling in the hands or feet.  Gastrointestinal: Pt reports intermittent constipation. Denies abdominal pain, bloating, diarrhea or blood in the stool.  GU: Pt reports stress incontinence. Denies urgency, frequency, pain with urination, burning sensation, blood in urine, odor or discharge. Musculoskeletal: Pt reports chronic joint and muscle pain. Denies decrease in range of motion, difficulty with gait, muscle or joint swelling.  Skin: Denies redness, rashes, lesions or ulcercations.  Neurological: Pt reports neuropathic pain. Denies dizziness, difficulty with memory, difficulty with speech or problems with balance and coordination.  Psych: Patient has a history of depression.  Denies anxiety, SI/HI.  No other specific complaints in a complete review of systems (except as listed in HPI above).  Objective:   Physical Exam  BP 98/64 (BP Location: Left Arm, Patient Position: Sitting, Cuff Size: Normal)   Ht 5\' 9"  (1.753 m)   Wt 169 lb 6.4 oz (76.8 kg)   LMP  (LMP Unknown)   BMI 25.02 kg/m   Wt Readings from Last 3 Encounters:  04/10/23 188 lb (85.3 kg)  03/19/23 190 lb (86.2 kg)  02/18/23 193 lb (87.5 kg)    General: Appears her stated age, obese, in NAD. Skin: Warm, dry and intact. No ulcerations noted. HEENT: Head: normal shape and size; Eyes: sclera white, no icterus, conjunctiva pink, PERRLA and EOMs intact;  Cardiovascular: Normal rate and rhythm. S1,S2 noted.  No murmur, rubs or gallops noted. No JVD or BLE edema. No carotid bruits noted. Pulmonary/Chest: Normal effort and positive vesicular breath  sounds. No respiratory distress. No wheezes, rales or ronchi noted.  Musculoskeletal: Kyphotic.  Strength 5/5 BUE/BLE.  No difficulty with gait.  Neurological: Alert and oriented. Coordination  normal.  Psychiatric: Mood and affect normal. Behavior is normal. Judgment and thought content normal.   BMET    Component Value Date/Time   NA 140 02/18/2023 1413   NA 139 10/23/2021 1552   NA 135 (L) 06/30/2013 1203   K 4.5 02/18/2023 1413   K 4.1 06/30/2013 1203   CL 106 02/18/2023 1413   CL 102 06/30/2013 1203   CO2 27 02/18/2023 1413   CO2 32 06/30/2013 1203   GLUCOSE 105 02/18/2023 1413   GLUCOSE 143 (H) 06/30/2013 1203   BUN 29 (H) 02/18/2023 1413   BUN 21 10/23/2021 1552   BUN 19 (H) 06/30/2013 1203   CREATININE 1.11 (H) 02/18/2023 1413   CALCIUM 8.8 02/18/2023 1413   CALCIUM 9.7 06/30/2013 1203   GFRNONAA 56 (L) 12/21/2020 0957   GFRAA 65 12/21/2020 0957    Lipid Panel     Component Value Date/Time   CHOL 112 02/18/2023 1413   TRIG 144 02/18/2023 1413   HDL 44 (L) 02/18/2023 1413   CHOLHDL 2.5 02/18/2023 1413   VLDL 31.6 03/12/2019 1255   LDLCALC 45 02/18/2023 1413    CBC    Component Value Date/Time   WBC 4.8 02/18/2023 1413   RBC 4.43 02/18/2023 1413   HGB 12.8 02/18/2023 1413   HGB 14.0 11/12/2011 1136   HCT 37.9 02/18/2023 1413   HCT 40.7 11/12/2011 1136   PLT 223 02/18/2023 1413   PLT 274 11/12/2011 1136   MCV 85.6 02/18/2023 1413   MCV 86 11/12/2011 1136   MCH 28.9 02/18/2023 1413   MCHC 33.8 02/18/2023 1413   RDW 12.4 02/18/2023 1413   RDW 12.8 11/12/2011 1136   LYMPHSABS 1.4 09/06/2020 1330   LYMPHSABS 2.5 11/12/2011 1136   MONOABS 0.5 09/06/2020 1330   MONOABS 0.8 11/12/2011 1136   EOSABS 0.2 09/06/2020 1330   EOSABS 0.6 11/12/2011 1136   BASOSABS 0.1 09/06/2020 1330   BASOSABS 0.1 11/12/2011 1136    Hgb A1C Lab Results  Component Value Date   HGBA1C 5.6 02/18/2023           Assessment & Plan:      RTC in 6 months for your annual exam Nicki Reaper, NP

## 2023-09-26 NOTE — Assessment & Plan Note (Signed)
C-Met and lipid profile today Encouraged her to consume low-fat diet Continue rosuvastatin, ezetimibe and aspirin 

## 2023-09-26 NOTE — Assessment & Plan Note (Signed)
C-Met today 

## 2023-09-26 NOTE — Assessment & Plan Note (Signed)
Continue paroxetine and bupropion Support offered

## 2023-09-26 NOTE — Assessment & Plan Note (Signed)
 Managed with tizanidine and pregabalin as needed

## 2023-09-26 NOTE — Assessment & Plan Note (Signed)
 Encouraged diet and exercise for weight loss ?

## 2023-09-26 NOTE — Assessment & Plan Note (Signed)
C-Met and lipid profile today Encouraged her to consume a low-fat diet Continue rosuvastatin, ezetimibe and aspirin

## 2023-09-26 NOTE — Assessment & Plan Note (Signed)
Encourage weight loss as this can help reduce sleep apnea symptoms Continue CPAP 

## 2023-09-27 ENCOUNTER — Encounter: Payer: Self-pay | Admitting: Internal Medicine

## 2023-09-27 LAB — COMPREHENSIVE METABOLIC PANEL WITH GFR
AG Ratio: 1.7 (calc) (ref 1.0–2.5)
ALT: 76 U/L — ABNORMAL HIGH (ref 6–29)
AST: 91 U/L — ABNORMAL HIGH (ref 10–35)
Albumin: 4.3 g/dL (ref 3.6–5.1)
Alkaline phosphatase (APISO): 121 U/L (ref 37–153)
BUN: 19 mg/dL (ref 7–25)
CO2: 30 mmol/L (ref 20–32)
Calcium: 9.3 mg/dL (ref 8.6–10.4)
Chloride: 103 mmol/L (ref 98–110)
Creat: 1.05 mg/dL (ref 0.50–1.05)
Globulin: 2.5 g/dL (ref 1.9–3.7)
Glucose, Bld: 78 mg/dL (ref 65–139)
Potassium: 4.7 mmol/L (ref 3.5–5.3)
Sodium: 141 mmol/L (ref 135–146)
Total Bilirubin: 0.5 mg/dL (ref 0.2–1.2)
Total Protein: 6.8 g/dL (ref 6.1–8.1)
eGFR: 59 mL/min/{1.73_m2} — ABNORMAL LOW (ref >=60)

## 2023-09-27 LAB — CBC
HCT: 37.8 % (ref 35.0–45.0)
Hemoglobin: 12.5 g/dL (ref 11.7–15.5)
MCH: 28.9 pg (ref 27.0–33.0)
MCHC: 33.1 g/dL (ref 32.0–36.0)
MCV: 87.5 fL (ref 80.0–100.0)
MPV: 9.4 fL (ref 7.5–12.5)
Platelets: 180 10*3/uL (ref 140–400)
RBC: 4.32 10*6/uL (ref 3.80–5.10)
RDW: 12.1 % (ref 11.0–15.0)
WBC: 4.2 10*3/uL (ref 3.8–10.8)

## 2023-09-27 LAB — LIPID PANEL
Cholesterol: 107 mg/dL (ref <200)
HDL: 50 mg/dL (ref >=50)
LDL Cholesterol (Calc): 44 mg/dL
Non-HDL Cholesterol (Calc): 57 mg/dL (ref <130)
Total CHOL/HDL Ratio: 2.1 (calc) (ref <5.0)
Triglycerides: 57 mg/dL (ref <150)

## 2023-09-27 LAB — HEMOGLOBIN A1C
Hgb A1c MFr Bld: 5.4 %{Hb} (ref <5.7)
Mean Plasma Glucose: 108 mg/dL
eAG (mmol/L): 6 mmol/L

## 2023-10-14 ENCOUNTER — Other Ambulatory Visit: Payer: Self-pay | Admitting: Internal Medicine

## 2023-10-14 DIAGNOSIS — M797 Fibromyalgia: Secondary | ICD-10-CM

## 2023-10-15 LAB — HM DIABETES EYE EXAM

## 2023-10-15 NOTE — Telephone Encounter (Signed)
 Requested medication (s) are due for refill today: yes  Requested medication (s) are on the active medication list: yes  Last refill:  07/09/23  Future visit scheduled: yes  Notes to clinic:  Unable to refill per protocol, cannot delegate.      Requested Prescriptions  Pending Prescriptions Disp Refills   pregabalin (LYRICA) 150 MG capsule [Pharmacy Med Name: PREGABALIN 150 MG CAPSULE] 270 capsule 0    Sig: TAKE 1 CAPSULE BY MOUTH THREE TIMES A DAY     Not Delegated - Neurology:  Anticonvulsants - Controlled - pregabalin Failed - 10/15/2023  2:12 PM      Failed - This refill cannot be delegated      Passed - Cr in normal range and within 360 days    Creat  Date Value Ref Range Status  09/26/2023 1.05 0.50 - 1.05 mg/dL Final   Creatinine,U  Date Value Ref Range Status  03/12/2019 131.0 mg/dL Final   Creatinine, Urine  Date Value Ref Range Status  02/18/2023 251 20 - 275 mg/dL Final         Passed - Completed PHQ-2 or PHQ-9 in the last 360 days      Passed - Valid encounter within last 12 months    Recent Outpatient Visits           2 weeks ago Type 2 diabetes mellitus with diabetic mononeuropathy, without long-term current use of insulin [facility])   [facility] [place], [place], NP

## 2023-10-22 ENCOUNTER — Ambulatory Visit (HOSPITAL_COMMUNITY)
Admission: RE | Admit: 2023-10-22 | Discharge: 2023-10-22 | Disposition: A | Discharge status: Home / Self Care | Source: Ambulatory Visit | Attending: Cardiology | Admitting: Cardiology

## 2023-10-22 DIAGNOSIS — I6523 Occlusion and stenosis of bilateral carotid arteries: Secondary | ICD-10-CM | POA: Insufficient documentation

## 2023-10-24 ENCOUNTER — Other Ambulatory Visit: Payer: Self-pay | Admitting: Physician Assistant

## 2023-10-24 ENCOUNTER — Other Ambulatory Visit (HOSPITAL_COMMUNITY): Payer: Self-pay | Admitting: Physician Assistant

## 2023-10-24 DIAGNOSIS — I6523 Occlusion and stenosis of bilateral carotid arteries: Secondary | ICD-10-CM

## 2023-10-27 ENCOUNTER — Other Ambulatory Visit: Payer: Self-pay | Admitting: Internal Medicine

## 2023-10-29 NOTE — Telephone Encounter (Signed)
 Requested Prescriptions  Pending Prescriptions Disp Refills   buPROPion  (WELLBUTRIN  XL) 150 MG 24 hr tablet [Pharmacy Med Name: BUPROPION  HCL XL 150 MG TABLET] 90 tablet 0    Sig: TAKE 1 TABLET BY MOUTH EVERY DAY     Psychiatry: Antidepressants - bupropion  Failed - 10/29/2023 11:10 AM      Failed - AST in normal range and within 360 days    AST  Date Value Ref Range Status  09/26/2023 91 (H) 10 - 35 U/L Final         Failed - ALT in normal range and within 360 days    ALT  Date Value Ref Range Status  09/26/2023 76 (H) 6 - 29 U/L Final         Passed - Cr in normal range and within 360 days    Creat  Date Value Ref Range Status  09/26/2023 1.05 0.50 - 1.05 mg/dL Final   Creatinine,U  Date Value Ref Range Status  03/12/2019 131.0 mg/dL Final   Creatinine, Urine  Date Value Ref Range Status  02/18/2023 251 20 - 275 mg/dL Final         Passed - Completed PHQ-2 or PHQ-9 in the last 360 days      Passed - Last BP in normal range    BP Readings from Last 1 Encounters:  09/26/23 98/64         Passed - Valid encounter within last 6 months    Recent Outpatient Visits           1 month ago Type 2 diabetes mellitus with diabetic mononeuropathy, without long-term current use of insulin  [facility])   [facility] [place], [place], NP               PARoxetine  (PAXIL ) 20 MG tablet [Pharmacy Med Name: PAROXETINE  HCL 20 MG TABLET] 45 tablet 0    Sig: TAKE 1/2 TABLET BY MOUTH DAILY IN THE MORNING     Psychiatry:  Antidepressants - SSRI Passed - 10/29/2023 11:10 AM      Passed - Completed PHQ-2 or PHQ-9 in the last 360 days      Passed - Valid encounter within last 6 months    Recent Outpatient Visits           1 month ago Type 2 diabetes mellitus with diabetic mononeuropathy, without long-term current use of insulin  [facility])   [facility] [place], [place], [place]

## 2023-11-04 ENCOUNTER — Encounter: Payer: Self-pay | Admitting: Internal Medicine

## 2023-11-20 MED ORDER — TIRZEPATIDE 2.5 MG/0.5ML ~~LOC~~ SOAJ: 2.5000 mg | SUBCUTANEOUS | 0 refills | Status: DC

## 2023-12-20 ENCOUNTER — Ambulatory Visit: Payer: 59

## 2023-12-20 DIAGNOSIS — Z0000 Encounter for general adult medical examination without abnormal findings: Secondary | ICD-10-CM

## 2023-12-20 NOTE — Patient Instructions (Signed)
 Melinda [name] , Thank you for taking time out of your busy schedule to complete your Annual Wellness Visit with me. I enjoyed our conversation and look forward to speaking with you again next year. I, as well as your care team,  appreciate your ongoing commitment to your health goals. Please review the following plan we discussed and let me know if I can assist you in the future.   Follow up Visits: Next Medicare AWV with our clinical staff:  12/25/24 @ 12:40 PM BY PHONE  Have you seen your provider in the last 6 months (3 months if uncontrolled diabetes)? Yes   Clinician Recommendations:  Aim for 30 minutes of exercise or brisk walking, 6-8 glasses of water , and 5 servings of fruits and vegetables each day. TAKE CARE!      This is a list of the screening recommended for you and due dates:  Health Maintenance  Topic Date Due   Zoster (Shingles) Vaccine (2 of 2) 05/07/2019   Eye exam for diabetics  10/11/2023   Flu Shot  01/31/2024   Yearly kidney health urinalysis for diabetes  02/18/2024   Complete foot exam   02/18/2024   Hemoglobin A1C  03/28/2024   Mammogram  05/02/2024   Yearly kidney function blood test for diabetes  09/25/2024   Medicare Annual Wellness Visit  12/19/2024   DEXA scan (bone density measurement)  05/01/2027   DTaP/Tdap/Td vaccine (5 - Td or Tdap) 07/08/2029   Colon Cancer Screening  02/23/2032   Pneumococcal Vaccine for age over 10  Completed   Hepatitis C Screening  Completed   HPV Vaccine  Aged Out   Meningitis B Vaccine  Aged Out   COVID-19 Vaccine  Discontinued    Advanced directives: (ACP Link)Information on Advanced Care Planning can be found at [place]  Secretary of State Advance Health Care Directives Advance Health Care Directives. [email or web address]  Advance Care Planning is important because it:  [x]  Makes sure you receive the medical care that is consistent with your values, goals, and preferences  [x]  It provides guidance to your family and loved ones  and reduces their decisional burden about whether or not they are making the right decisions based on your wishes.  Follow the link provided in your after visit summary or read over the paperwork we have mailed to you to help you started getting your Advance Directives in place. If you need assistance in completing these, please reach out to us  so that we can help you!

## 2023-12-20 NOTE — Progress Notes (Signed)
 Subjective:   Melinda [name] is a 68 y.o. who presents for a Medicare Wellness preventive visit.  As a reminder, Annual Wellness Visits don't include a physical exam, and some assessments may be limited, especially if this visit is performed virtually. We may recommend an in-person follow-up visit with your provider if needed.  Visit Complete: Virtual I connected with  Melinda [name] on 12/20/23 by a audio enabled telemedicine application and verified that I am speaking with the correct person using two identifiers.  Patient Location: Home  Provider Location: Home Office  I discussed the limitations of evaluation and management by telemedicine. The patient expressed understanding and agreed to proceed.  Vital Signs: Because this visit was a virtual/telehealth visit, some criteria may be missing or patient reported. Any vitals not documented were not able to be obtained and vitals that have been documented are patient reported.  VideoDeclined- This patient declined Librarian, academic. Therefore the visit was completed with audio only.  Persons Participating in Visit: Patient.  AWV Questionnaire: No: Patient Medicare AWV questionnaire was not completed prior to this visit.  Cardiac Risk Factors include: advanced age (>37men, >35 women);dyslipidemia;diabetes mellitus     Objective:    Today's Vitals   12/20/23 1327  PainSc: 5    There is no height or weight on file to calculate BMI.     12/20/2023    1:34 PM 10/12/2022    1:11 PM 02/22/2022    9:42 AM 12/26/2021    1:19 PM 12/14/2021   12:06 PM 12/08/2021    3:44 PM 12/07/2021    2:16 PM  Advanced Directives  Does Patient Have a Medical Advance Directive? Yes Yes Yes Yes Yes Yes Yes  Type of Estate agent of [place];Living will Healthcare Power of [place];Living will Living will      Does patient want to make changes to medical advance directive? No - Patient declined No - Patient  declined   No - Patient declined    Copy of Healthcare Power of Attorney in Chart? Yes - validated most recent copy scanned in chart (See row information) Yes - validated most recent copy scanned in chart (See row information)         Current Medications (verified) Outpatient Encounter Medications as of 12/20/2023  Medication Sig   ACCU-CHEK GUIDE test strip USE 1 EACH BY OTHER ROUTE 3 (THREE) TIMES DAILY. USE AS INSTRUCTE   Accu-Chek Softclix Lancets lancets USE TO CHECK BLOOD SUGAR 1 TIME DAILY   Ascorbic Acid (VITAMIN C) 1000 MG tablet Take 1,000 mg by mouth daily.   ASPIRIN  81 PO Take 81 mg by mouth daily.   Biotin 16109 MCG TABS Take 10,000 mcg by mouth daily.   Blood Glucose Calibration (ACCU-CHEK GUIDE CONTROL) LIQD 1 each by In Vitro route as needed.   buPROPion  (WELLBUTRIN  XL) 150 MG 24 hr tablet TAKE 1 TABLET BY MOUTH EVERY DAY   CALCIUM -MAGNESIUM-ZINC  PO Take 3 capsules by mouth daily.   CINNAMON PO Take 1,000 mg by mouth daily.   Coenzyme Q10 (CO Q 10 PO) Take 1 tablet by mouth daily.   ezetimibe  (ZETIA ) 10 MG tablet Take 1 tablet (10 mg total) by mouth daily.   fluticasone  (FLONASE ) 50 MCG/ACT nasal spray Place 2 sprays into both nostrils daily. (Patient taking differently: Place 2 sprays into both nostrils daily. TAKES PRN)   glucosamine-chondroitin 500-400 MG tablet Take 2 tablets by mouth daily.    nitroGLYCERIN  (NITROSTAT ) 0.4 MG SL tablet  PLACE 1 TABLET UNDER THE TONGUE EVERY 5 (FIVE) MINUTES AS NEEDED FOR CHEST PAIN.   Omega-3 Fatty Acids (FISH OIL) 1200 MG CAPS Take 1,200 mg by mouth in the morning.    PARoxetine  (PAXIL ) 20 MG tablet TAKE 1/2 TABLET BY MOUTH DAILY IN THE MORNING   pregabalin  (LYRICA ) 150 MG capsule TAKE 1 CAPSULE BY MOUTH THREE TIMES A DAY   rosuvastatin  (CRESTOR ) 20 MG tablet TAKE 1 TABLET BY MOUTH EVERY DAY   tirzepatide  (MOUNJARO ) 2.5 MG/0.5ML Pen Inject 2.5 mg into the skin once a week.   tiZANidine  (ZANAFLEX ) 2 MG tablet TAKE 1 TABLET (2 MG TOTAL) BY  MOUTH IN THE MORNING AND AT BEDTIME.   vitamin E  180 MG (400 UNITS) capsule Take 400 Units by mouth daily.   No facility-administered encounter medications on file as of 12/20/2023.    Allergies (verified) Patient has no known allergies.   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 b.) Doppler 06/28/2015: 40-59% BILATERAL ICA. c.) Dopplers 04/23/2019, 04/22/2020, 10/20/2021: 40-59% RICA and 1-39% LICA.   Blue toes    2nd toe on right foot, will get appt.   Bulging disc    CAD (coronary artery disease) 2009   a.) PCI with placement of a 15 x 30 mm Promus DES to mLAD. b.) LHC 02/27/2011: EF 60%; no sig CAD; mild lum irreg at ostium of diagnoal (jailed by stent).   Chicken pox    Chronic fatigue    Chronic, continuous use of opioids    CKD (chronic kidney disease), stage III (HCC)    COVID 02/22/2022   Degenerative disc disease    Depression    Diverticulosis    Facet joint disease    Failed cervical fusion    Fibromyalgia    Hiatal hernia    Hyperlipidemia    IBS (irritable bowel syndrome)    MCL deficiency, knee    MRSA (methicillin resistant staph aureus) culture positive 2011   GREAT TOE RIGHT FOOT   Neuropathy 04/18/2010   Orthostatic hypotension 01/2020   a.) multiple pre-syncopal episodes; takes proamatine    OSA on CPAP 08/17/2003   Osteoarthritis    a.) back, neck, hands, knees   Restless leg syndrome    Sepsis (HCC) 09/2013   Spinal headache 11/07/2021   Spinal stenosis    T2DM (type 2 diabetes mellitus) (HCC)    Wolff-Parkinson-White (WPW) syndrome 1992   a.) s/p ablations; unsuccessful. Now has rare episodes   Past Surgical History:  Procedure  Laterality Date   AMPUTATION TOE Right 02/01/2017   Procedure: AMPUTATION TOE-RIGHT 2ND MPJ;  Surgeon: Sharlyn Deaner, DPM;  Location: [facility] ORS;  Service: Podiatry;  Laterality: Right;   ANTERIOR CERVICAL DECOMP/DISCECTOMY FUSION N/A 07/28/2014   Procedure: ANTERIOR CERVICAL DECOMPRESSION/DISCECTOMY FUSION CERVICAL 3-4,4-5,5-6 LEVELS WITH INSTRUMENTATION AND ALLOGRAFT;  Surgeon: Estevan Helper, MD;  Location: [facility] OR;  Service: Orthopedics;  Laterality: N/A;  Anterior cervical decompression fusion, cervical 3-4, cervical 4-5, cervical 5-6 with instrumentation and allograft   BACK SURGERY     X 3 1979, 1994, 1995   BLEPHAROPLASTY Bilateral 2013   CARDIAC ELECTROPHYSIOLOGY STUDY AND ABLATION N/A 1992   CARPAL TUNNEL RELEASE  Right    CHOLECYSTECTOMY  2003   COLONOSCOPY WITH PROPOFOL  N/A 02/22/2022   Procedure: COLONOSCOPY WITH PROPOFOL ;  Surgeon: Luke Salaam, MD;  Location: [facility] ENDOSCOPY;  Service: Gastroenterology;  Laterality: N/A;   CORONARY ANGIOPLASTY WITH STENT PLACEMENT N/A 2009   Procedure: CORONARY ANGIOPLASTY WITH STENT PLACEMENT   ENDOMETRIAL ABLATION  2007   EXCISION PARTIAL PHALANX Left 09/28/2021   Procedure: EXCISION PARTIAL PHALANX - FIRST;  Surgeon: Pink Bridges, DPM;  Location: [facility];  Service: Podiatry;  Laterality: Left;   GALLBLADDER SURGERY     HAMMER TOE SURGERY Right 10/17/2017   Procedure: HAMMER TOE CORRECTION-4TH TOE;  Surgeon: Sharlyn Deaner, DPM;  Location: [facility];  Service: Podiatry;  Laterality: Right;  LMA- WITH LOCAL Diabetic - diet controlled   HAND SURGERY Left    INFUSION PUMP IMPLANTATION     X2 with morphine  and baclofen   INTRATHECAL PUMP REVISION N/A 04/25/2018   Procedure: Intrathecal pump replacement;  Surgeon: Gerri Kras, MD;  Location: [facility] OR;  Service: Neurosurgery;  Laterality: N/A;  right   INTRATHECAL PUMP REVISION Right 04/25/2018   INTRATHECAL PUMP REVISION N/A 12/12/2018   Procedure: Intrathecal pump  revision with exploration of pocket;  Surgeon: Gerri Kras, MD;  Location: [facility] OR;  Service: Neurosurgery;  Laterality: N/A;  Intrathecal pump revision with exploration of pocket   INTRATHECAL PUMP REVISION N/A 03/11/2020   Procedure: INTRATHECAL PUMP REVISION;  Surgeon: Renaldo Caroli, MD;  Location: [facility] ORS;  Service: Neurosurgery;  Laterality: N/A;   INTRATHECAL PUMP REVISION Right 12/14/2021   Procedure: INTRATHECAL PUMP REMOVAL;  Surgeon: Renaldo Caroli, MD;  Location: [facility] ORS;  Service: Neurosurgery;  Laterality: Right;   IRRIGATION AND DEBRIDEMENT FOOT Right 02/23/2017   Procedure: IRRIGATION AND DEBRIDEMENT FOOT-right foot;  Surgeon: Anell Baptist, DPM;  Location: [facility] ORS;  Service: Podiatry;  Laterality: Right;   KNEE SURGERY Right 05/24/2020   LEFT HEART CATH AND CORONARY ANGIOGRAPHY Left 02/27/2011   Procedure: LEFT HEART CATH AND CORONARY ANGIOGRAPHY   PAIN PUMP REVISION N/A 08/15/2018   Procedure: Intrathecal PUMP REVISION;  Surgeon: Gerri Kras, MD;  Location: [facility] OR;  Service: Neurosurgery;  Laterality: N/A;  INTRATHECAL PUMP REVISION   PLANTAR FASCIA SURGERY Bilateral    TOE AMPUTATION Right    RIGHT great toe (1st digit)   TOE AMPUTATION Right    a.) RIGHT second toe (2nd digit)   TOE SURGERY     then revision 8/18   TOTAL KNEE ARTHROPLASTY Right 05/24/2020   Procedure: RIGHT TOTAL KNEE ARTHROPLASTY;  Surgeon: Dayne Even, MD;  Location: [facility] ORS;  Service: Orthopedics;  Laterality: Right;   TOTAL KNEE REVISION Right 09/06/2020   : RIGHT KNEE POLY REVISION;  Dayne Even, MD) PLAN TO DISCHARGE FROM PACU   WOUND DEBRIDEMENT Left 09/28/2021   Procedure: DEBRIDEMENT SKIN, SUBCUTANEOUS TISSUE - 11042;  Surgeon: Pink Bridges, DPM;  Location: [facility];  Service: Podiatry;  Laterality: Left;  Diabetic   Family History  Problem Relation Age of Onset   Lung cancer Mother    Arthritis Father    Heart disease Father    Diabetes Father    Dementia  Maternal Grandmother    Alcohol abuse Paternal Grandfather    Heart disease Paternal Grandfather    Stroke Paternal Grandfather    BRCA 1/2 Neg Hx    Breast cancer Neg Hx    Social History   Socioeconomic History   Marital status: Significant Other    Spouse name: Not on file  Number of children: Not on file   Years of education: Not on file   Highest education level: GED or equivalent  Occupational History   Not on file  Tobacco Use   Smoking status: Former    Current packs/day: 0.00    Average packs/day: 1.5 packs/day for 32.0 years (48.0 ttl pk-yrs)    Types: Cigarettes    Start date: 07/22/1975    Quit date: 07/22/2007    Years since quitting: 16.4   Smokeless tobacco: Former  Building services engineer status: Never Used  Substance and Sexual Activity   Alcohol use: Not Currently    Comment: occ - Holidays   Drug use: Not Currently    Comment: prescribed pain pump and oxy   Sexual activity: Yes  Other Topics Concern   Not on file  Social History Narrative   Lives at home with a partner. Independent at baseline   Social Drivers of Health   Financial Resource Strain: Low Risk  (12/20/2023)   Overall Financial Resource Strain (CARDIA)    Difficulty of Paying Living Expenses: Not hard at all  Food Insecurity: No Food Insecurity (12/20/2023)   Hunger Vital Sign    Worried About Running Out of Food in the Last Year: Never true    Ran Out of Food in the Last Year: Never true  Transportation Needs: No Transportation Needs (12/20/2023)   PRAPARE - Administrator, Civil Service (Medical): No    Lack of Transportation (Non-Medical): No  Physical Activity: Insufficiently Active (12/20/2023)   Exercise Vital Sign    Days of Exercise per Week: 1 day    Minutes of Exercise per Session: 20 min  Stress: No Stress Concern Present (12/20/2023)   [employer] of Occupational Health - Occupational Stress Questionnaire    Feeling of Stress: Only a little  Social  Connections: Moderately Integrated (12/20/2023)   Social Connection and Isolation Panel    Frequency of Communication with Friends and Family: More than three times a week    Frequency of Social Gatherings with Friends and Family: Once a week    Attends Religious Services: Never    Database administrator or Organizations: No    Attends Engineer, structural: More than 4 times per year    Marital Status: Living with partner  Recent Concern: Social Connections - Moderately Isolated (12/17/2023)   Social Connection and Isolation Panel    Frequency of Communication with Friends and Family: More than three times a week    Frequency of Social Gatherings with Friends and Family: Once a week    Attends Religious Services: Never    Database administrator or Organizations: No    Attends Engineer, structural: Not on file    Marital Status: Living with partner    Tobacco Counseling Counseling given: Not Answered    Clinical Intake:  Pre-visit preparation completed: Yes  Pain : 0-10 Pain Score: 5  Pain Type: Chronic pain Pain Location: Leg Pain Orientation: Right, Left, Posterior Pain Descriptors / Indicators: Aching, Discomfort, Constant Pain Onset: More than a month ago Pain Frequency: Constant     BMI - recorded: 25 Nutritional Status: BMI 25 -29 Overweight Nutritional Risks: None Diabetes: Yes CBG done?: No Did pt. bring in CBG monitor from home?: No  Lab Results  Component Value Date   HGBA1C 5.4 09/26/2023   HGBA1C 5.6 02/18/2023   HGBA1C 5.6 06/11/2022     How often do you need  to have someone help you when you read instructions, pamphlets, or other written materials from your doctor or pharmacy?: 1 - Never  Interpreter Needed?: No  Information entered by :: Dellie Fergusson, LPN   Activities of Daily Living    12/20/2023    1:36 PM 02/18/2023    2:10 PM  In your present state of health, do you have any difficulty performing the following  activities:  Hearing? 0 0  Vision? 0 0  Difficulty concentrating or making decisions? 1 1  Comment CONCENTRATING/FOCUSING   Walking or climbing stairs? 0 1  Dressing or bathing? 0 0  Doing errands, shopping? 0 0  Preparing Food and eating ? N   Using the Toilet? N   In the past six months, have you accidently leaked urine? N   Do you have problems with loss of bowel control? N   Managing your Medications? N   Managing your Finances? Y   Housekeeping or managing your Housekeeping? N     Patient Care Team: Carollynn Cirri, NP as PCP - General (Internal Medicine) Hugh Madura, MD as PCP - Cardiology (Cardiology) [place], Heather, OD (Optometry)  I have updated your Care Teams any recent Medical Services you may have received from other providers in the past year.     Assessment:   This is a routine wellness examination for Melinda [name].  Hearing/Vision screen Hearing Screening - Comments:: NO AIDS Vision Screening - Comments:: WEARS GLASSES ALL DAY- DR.OMAN   Goals Addressed             This Visit's Progress    DIET - INCREASE WATER  INTAKE         Depression Screen     12/20/2023    1:32 PM 09/26/2023    1:30 PM 02/18/2023    2:10 PM 10/12/2022    1:09 PM 06/11/2022    2:46 PM 01/29/2022   12:53 PM 01/23/2022    2:33 PM  PHQ 2/9 Scores  PHQ - 2 Score 0 2 0 0 0 0 0  PHQ- 9 Score 0 6  0   0    Fall Risk     12/20/2023    1:35 PM 09/26/2023    1:30 PM 02/18/2023    2:10 PM 10/12/2022    1:12 PM 10/08/2022   10:37 AM  Fall Risk   Falls in the past year? 1 0 0 0 0  Number falls in past yr: 0   0 0  Injury with Fall? 0  0 0 0  Risk for fall due to : History of fall(s)  No Fall Risks No Fall Risks   Follow up Falls evaluation completed;Falls prevention discussed   Falls prevention discussed;Falls evaluation completed     MEDICARE RISK AT HOME:  Medicare Risk at Home Any stairs in or around the home?: Yes If so, are there any without handrails?: No Home free of loose  throw rugs in walkways, pet beds, electrical cords, etc?: Yes Adequate lighting in your home to reduce risk of falls?: Yes Life alert?: No Use of a cane, walker or w/c?: No Grab bars in the bathroom?: No Shower chair or bench in shower?: No Elevated toilet seat or a handicapped toilet?: No  TIMED UP AND GO:  Was the test performed?  No  Cognitive Function: 6CIT completed        12/20/2023    1:38 PM 10/12/2022    1:17 PM 10/05/2021   11:31 AM  6CIT Screen  What Year? 0 points 0 points 0 points  What month? 0 points 0 points 0 points  What time? 0 points 0 points 0 points  Count back from 20 0 points 0 points 0 points  Months in reverse 0 points 0 points 0 points  Repeat phrase 0 points 0 points 0 points  Total Score 0 points 0 points 0 points    Immunizations Immunization History  Administered Date(s) Administered   Influenza,inj,Quad PF,6+ Mos 07/28/2015, 05/18/2016, 08/06/2017, 02/13/2019, 02/20/2020, 04/06/2022   Influenza-Unspecified 04/15/1998, 05/19/2014, 04/10/2021   PFIZER(Purple Top)SARS-COV-2 Vaccination 09/19/2019, 10/13/2019, 07/03/2020, 12/21/2020   PNEUMOCOCCAL CONJUGATE-20 02/18/2023, 06/05/2023   Pfizer Covid-19 Vaccine Bivalent Booster 75yrs & up 04/10/2021   Pneumococcal Polysaccharide-23 07/28/2015, 06/22/2021   Rabies, IM 07/09/2019, 07/12/2019, 07/16/2019, 07/23/2019   Rsv, Bivalent, Protein Subunit Rsvpref,pf Pattricia Bores) 06/05/2023   Td 07/03/1991   Td (Adult),unspecified 07/03/1991   Tdap 08/03/2007, 07/09/2019   Unspecified SARS-COV-2 Vaccination 04/06/2022   Zoster Recombinant(Shingrix) 03/12/2019    Screening Tests Health Maintenance  Topic Date Due   Zoster Vaccines- Shingrix (2 of 2) 05/07/2019   OPHTHALMOLOGY EXAM  10/11/2023   INFLUENZA VACCINE  01/31/2024   Diabetic kidney evaluation - Urine ACR  02/18/2024   FOOT EXAM  02/18/2024   HEMOGLOBIN A1C  03/28/2024   MAMMOGRAM  05/02/2024   Diabetic kidney evaluation - eGFR measurement   09/25/2024   Medicare Annual Wellness (AWV)  12/19/2024   DEXA SCAN  05/01/2027   DTaP/Tdap/Td (5 - Td or Tdap) 07/08/2029   Colonoscopy  02/23/2032   Pneumococcal Vaccine: 50+ Years  Completed   Hepatitis C Screening  Completed   HPV VACCINES  Aged Out   Meningococcal B Vaccine  Aged Out   COVID-19 Vaccine  Discontinued    Health Maintenance  Health Maintenance Due  Topic Date Due   Zoster Vaccines- Shingrix (2 of 2) 05/07/2019   OPHTHALMOLOGY EXAM  10/11/2023   Health Maintenance Items Addressed: UP TO DATE ON MAMMOGRAM, COLONOSCOPY & BONE DENSITY; UP TO DATE ON PNA & TDAP; NEED COVID & 2ND SHINGRIX  Additional Screening:  Vision Screening: Recommended annual ophthalmology exams for early detection of glaucoma and other disorders of the eye. Would you like a referral to an eye doctor? No    Dental Screening: Recommended annual dental exams for proper oral hygiene  Community Resource Referral / Chronic Care Management: CRR required this visit?  No   CCM required this visit?  No   Plan:    I have personally reviewed and noted the following in the patient's chart:   Medical and social history Use of alcohol, tobacco or illicit drugs  Current medications and supplements including opioid prescriptions. Patient is not currently taking opioid prescriptions. Functional ability and status Nutritional status Physical activity Advanced directives List of other physicians Hospitalizations, surgeries, and ER visits in previous 12 months Vitals Screenings to include cognitive, depression, and falls Referrals and appointments  In addition, I have reviewed and discussed with patient certain preventive protocols, quality metrics, and best practice recommendations. A written personalized care plan for preventive services as well as general preventive health recommendations were provided to patient.   Pinky Bright, LPN   1/61/0960   After Visit Summary: (MyChart) Due to  this being a telephonic visit, the after visit summary with patients personalized plan was offered to patient via MyChart   Notes: Nothing significant to report at this time.

## 2024-01-22 ENCOUNTER — Encounter: Payer: Self-pay | Admitting: Internal Medicine

## 2024-01-22 DIAGNOSIS — M797 Fibromyalgia: Secondary | ICD-10-CM

## 2024-01-23 MED ORDER — PREGABALIN 150 MG PO CAPS: 150.0000 mg | ORAL_CAPSULE | Freq: Three times a day (TID) | ORAL | 0 refills | Status: DC

## 2024-01-24 ENCOUNTER — Other Ambulatory Visit: Payer: Self-pay | Admitting: Physician Assistant

## 2024-01-24 ENCOUNTER — Other Ambulatory Visit: Payer: Self-pay | Admitting: Internal Medicine

## 2024-01-24 NOTE — Telephone Encounter (Signed)
 Requested medications are due for refill today.  yes  Requested medications are on the active medications list.  yes  Last refill. 07/29/2023 #180 1 rf  Future visit scheduled.   yes  Notes to clinic.  Refill not delegated.    Requested Prescriptions  Pending Prescriptions Disp Refills   tiZANidine  (ZANAFLEX ) 2 MG tablet [Pharmacy Med Name: TIZANIDINE  HCL 2 MG TABLET] 180 tablet 1    Sig: TAKE 1 TABLET (2 MG TOTAL) BY MOUTH IN THE MORNING AND AT BEDTIME.     Not Delegated - Cardiovascular:  Alpha-2 Agonists - tizanidine  Failed - 01/24/2024  5:23 PM      Failed - This refill cannot be delegated      Passed - Valid encounter within last 6 months    Recent Outpatient Visits           4 months ago Type 2 diabetes mellitus with diabetic mononeuropathy, without long-term current use of insulin  [facility])   [facility] [place], Angeline ORN, NP       Future Appointments             In 4 days Leonce Katz, DO [facility] [place] Sports Medicine at [facility]            Signed Prescriptions Disp Refills   buPROPion  (WELLBUTRIN  XL) 150 MG 24 hr tablet 90 tablet 0    Sig: TAKE 1 TABLET BY MOUTH EVERY DAY     Psychiatry: Antidepressants - bupropion  Failed - 01/24/2024  5:23 PM      Failed - AST in normal range and within 360 days    AST  Date Value Ref Range Status  09/26/2023 91 (H) 10 - 35 U/L Final         Failed - ALT in normal range and within 360 days    ALT  Date Value Ref Range Status  09/26/2023 76 (H) 6 - 29 U/L Final         Passed - Cr in normal range and within 360 days    Creat  Date Value Ref Range Status  09/26/2023 1.05 0.50 - 1.05 mg/dL Final   Creatinine, Urine  Date Value Ref Range Status  02/18/2023 251 20 - 275 mg/dL Final         Passed - Completed PHQ-2 or PHQ-9 in the last 360 days      Passed - Last BP in normal range    BP Readings from Last 1 Encounters:  09/26/23 98/64         Passed - Valid encounter  within last 6 months    Recent Outpatient Visits           4 months ago Type 2 diabetes mellitus with diabetic mononeuropathy, without long-term current use of insulin  [facility])    [facility] [place], Angeline ORN, NP       Future Appointments             In 4 days Leonce Katz, DO [facility]  Sports Medicine at [facility]             PARoxetine  (PAXIL ) 20 MG tablet 45 tablet 0    Sig: TAKE 1/2 TABLET BY MOUTH DAILY IN THE MORNING     Psychiatry:  Antidepressants - SSRI Passed - 01/24/2024  5:23 PM      Passed - Completed PHQ-2 or PHQ-9 in the last 360 days      Passed - Valid  encounter within last 6 months    Recent Outpatient Visits           4 months ago Type 2 diabetes mellitus with diabetic mononeuropathy, without long-term current use of insulin  [facility])   [facility] [place], Angeline ORN, NP       Future Appointments             In 4 days Leonce Katz, DO [facility] [place] Sports Medicine at [facility]

## 2024-01-24 NOTE — Telephone Encounter (Signed)
 Requested Prescriptions  Pending Prescriptions Disp Refills   tiZANidine  (ZANAFLEX ) 2 MG tablet [Pharmacy Med Name: TIZANIDINE  HCL 2 MG TABLET] 180 tablet 1    Sig: TAKE 1 TABLET (2 MG TOTAL) BY MOUTH IN THE MORNING AND AT BEDTIME.     Not Delegated - Cardiovascular:  Alpha-2 Agonists - tizanidine  Failed - 01/24/2024  5:22 PM      Failed - This refill cannot be delegated      Passed - Valid encounter within last 6 months    Recent Outpatient Visits           4 months ago Type 2 diabetes mellitus with diabetic mononeuropathy, without long-term current use of insulin  [facility])   [facility] [place], Angeline ORN, NP       Future Appointments             In 4 days Leonce Katz, DO [facility] [place] [facility] at [facility]             buPROPion  (WELLBUTRIN  XL) 150 MG 24 hr tablet [Pharmacy Med Name: BUPROPION  HCL XL 150 MG TABLET] 90 tablet 0    Sig: TAKE 1 TABLET BY MOUTH EVERY DAY     Psychiatry: Antidepressants - bupropion  Failed - 01/24/2024  5:22 PM      Failed - AST in normal range and within 360 days    AST  Date Value Ref Range Status  09/26/2023 91 (H) 10 - 35 U/L Final         Failed - ALT in normal range and within 360 days    ALT  Date Value Ref Range Status  09/26/2023 76 (H) 6 - 29 U/L Final         Passed - Cr in normal range and within 360 days    Creat  Date Value Ref Range Status  09/26/2023 1.05 0.50 - 1.05 mg/dL Final   Creatinine, Urine  Date Value Ref Range Status  02/18/2023 251 20 - 275 mg/dL Final         Passed - Completed PHQ-2 or PHQ-9 in the last 360 days      Passed - Last BP in normal range    BP Readings from Last 1 Encounters:  09/26/23 98/64         Passed - Valid encounter within last 6 months    Recent Outpatient Visits           4 months ago Type 2 diabetes mellitus with diabetic mononeuropathy, without long-term current use of insulin  [facility])   [facility] [facility]  [place], [place], NP       Future Appointments             In 4 days Leonce Katz, DO [facility] [place] [facility] at [facility]             PARoxetine  (PAXIL ) 20 MG tablet [Pharmacy Med Name: PAROXETINE  HCL 20 MG TABLET] 45 tablet 0    Sig: TAKE 1/2 TABLET BY MOUTH DAILY IN THE MORNING     Psychiatry:  Antidepressants - SSRI Passed - 01/24/2024  5:22 PM      Passed - Completed PHQ-2 or PHQ-9 in the last 360 days      Passed - Valid encounter within last 6 months    Recent Outpatient Visits           4 months ago Type 2 diabetes mellitus with diabetic mononeuropathy, without long-term  current use of insulin  [facility])   [place] [facility] [place], Angeline ORN, NP       Future Appointments             In 4 days Leonce Katz, DO [facility] [place] [facility] at [facility]

## 2024-01-27 NOTE — Progress Notes (Unsigned)
 Ben Jackson D.CLEMENTEEN [facility] [address] [place] [address] Phone: [phone]   Assessment and Plan:     There are no diagnoses linked to this encounter.  ***   Pertinent previous records reviewed include ***    Follow Up: ***     Subjective:   I, Braeton Wolgamott, am serving as a Neurosurgeon for Doctor Morene Mace  Chief Complaint: right elbow pain   HPI:   01/28/2024 Patient is a 68 year old female with right elbow pain. Patient states  Relevant Historical Information: ***  Additional pertinent review of systems negative.   Current Outpatient Medications:    ACCU-CHEK GUIDE test strip, USE 1 EACH BY OTHER ROUTE 3 (THREE) TIMES DAILY. USE AS INSTRUCTE, Disp: 200 strip, Rfl: 1   Accu-Chek Softclix Lancets lancets, USE TO CHECK BLOOD SUGAR 1 TIME DAILY, Disp: 100 each, Rfl: 2   Ascorbic Acid (VITAMIN C) 1000 MG tablet, Take 1,000 mg by mouth daily., Disp: , Rfl:    ASPIRIN  81 PO, Take 81 mg by mouth daily., Disp: , Rfl:    Biotin 89999 MCG TABS, Take 10,000 mcg by mouth daily., Disp: , Rfl:    Blood Glucose Calibration (ACCU-CHEK GUIDE CONTROL) LIQD, 1 each by In Vitro route as needed., Disp: 1 each, Rfl: 1   buPROPion  (WELLBUTRIN  XL) 150 MG 24 hr tablet, TAKE 1 TABLET BY MOUTH EVERY DAY, Disp: 90 tablet, Rfl: 0   CALCIUM -MAGNESIUM-ZINC  PO, Take 3 capsules by mouth daily., Disp: , Rfl:    CINNAMON PO, Take 1,000 mg by mouth daily., Disp: , Rfl:    Coenzyme Q10 (CO Q 10 PO), Take 1 tablet by mouth daily., Disp: , Rfl:    ezetimibe  (ZETIA ) 10 MG tablet, Take 1 tablet (10 mg total) by mouth daily., Disp: 90 tablet, Rfl: 3   fluticasone  (FLONASE ) 50 MCG/ACT nasal spray, Place 2 sprays into both nostrils daily. (Patient taking differently: Place 2 sprays into both nostrils daily. TAKES PRN), Disp: 16 g, Rfl: 0   glucosamine-chondroitin 500-400 MG tablet, Take 2 tablets by mouth daily. , Disp: , Rfl:    nitroGLYCERIN  (NITROSTAT ) 0.4 MG  SL tablet, PLACE 1 TABLET UNDER THE TONGUE EVERY 5 (FIVE) MINUTES AS NEEDED FOR CHEST PAIN., Disp: 25 tablet, Rfl: 4   Omega-3 Fatty Acids (FISH OIL) 1200 MG CAPS, Take 1,200 mg by mouth in the morning. , Disp: , Rfl:    PARoxetine  (PAXIL ) 20 MG tablet, TAKE 1/2 TABLET BY MOUTH DAILY IN THE MORNING, Disp: 45 tablet, Rfl: 0   pregabalin  (LYRICA ) 150 MG capsule, Take 1 capsule (150 mg total) by mouth in the morning, at noon, and at bedtime., Disp: 270 capsule, Rfl: 0   rosuvastatin  (CRESTOR ) 20 MG tablet, TAKE 1 TABLET BY MOUTH EVERY DAY, Disp: 90 tablet, Rfl: 3   tirzepatide  (MOUNJARO ) 2.5 MG/0.5ML Pen, Inject 2.5 mg into the skin once a week., Disp: 6 mL, Rfl: 0   tiZANidine  (ZANAFLEX ) 2 MG tablet, TAKE 1 TABLET (2 MG TOTAL) BY MOUTH IN THE MORNING AND AT BEDTIME., Disp: 180 tablet, Rfl: 1   vitamin E  180 MG (400 UNITS) capsule, Take 400 Units by mouth daily., Disp: , Rfl:    Objective:     There were no vitals filed for this visit.    There is no height or weight on file to calculate BMI.    Physical Exam:    ***   Electronically signed by:  Odis Mace D.O. CAQSM  [facility] 12:40 PM 01/27/24

## 2024-01-28 ENCOUNTER — Ambulatory Visit (INDEPENDENT_AMBULATORY_CARE_PROVIDER_SITE_OTHER): Admitting: Sports Medicine

## 2024-01-28 VITALS — BP 106/58 | HR 79 | Ht 69.0 in | Wt 158.0 lb

## 2024-01-28 DIAGNOSIS — M542 Cervicalgia: Secondary | ICD-10-CM | POA: Diagnosis not present

## 2024-01-28 DIAGNOSIS — Z981 Arthrodesis status: Secondary | ICD-10-CM

## 2024-01-28 DIAGNOSIS — M47812 Spondylosis without myelopathy or radiculopathy, cervical region: Secondary | ICD-10-CM

## 2024-01-28 DIAGNOSIS — M7701 Medial epicondylitis, right elbow: Secondary | ICD-10-CM | POA: Diagnosis not present

## 2024-01-28 NOTE — Patient Instructions (Signed)
 Hep for golfer's elbow. Recommend getting a wrist brace. Recommend getting a repeat C6-C7 epidural. Follow up 2 weeks after epidural.

## 2024-01-29 ENCOUNTER — Ambulatory Visit: Admitting: Sports Medicine

## 2024-02-11 ENCOUNTER — Other Ambulatory Visit: Payer: Self-pay | Admitting: Internal Medicine

## 2024-02-12 NOTE — Discharge Instructions (Signed)
 Post Procedure Spinal Discharge Instruction Sheet  You may resume a regular diet and any medications that you routinely take (including pain medications) unless otherwise noted by MD.  No driving day of procedure.  Light activity throughout the rest of the day.  Do not do any strenuous work, exercise, bending or lifting.  The day following the procedure, you can resume normal physical activity but you should refrain from exercising or physical therapy for at least three days thereafter.  You may apply ice to the injection site, 20 minutes on, 20 minutes off, as needed. Do not apply ice directly to skin.    Common Side Effects:  Headaches- take your usual medications as directed by your physician.  Increase your fluid intake.  Caffeinated beverages may be helpful.  Lie flat in bed until your headache resolves.  Restlessness or inability to sleep- you may have trouble sleeping for the next few days.  Ask your referring physician if you need any medication for sleep.  Facial flushing or redness- should subside within a few days.  Increased pain- a temporary increase in pain a day or two following your procedure is not unusual.  Take your pain medication as prescribed by your referring physician.  Leg cramps  Please contact our office at [phone] for the following symptoms: Fever greater than 100 degrees. Headaches unresolved with medication after 2-3 days. Increased swelling, pain, or redness at injection site.   Thank you for visiting [facility] today.   ** YOU MAY RESUME YOUR ASPIRIN TODAY**

## 2024-02-13 ENCOUNTER — Ambulatory Visit
Admission: RE | Admit: 2024-02-13 | Discharge: 2024-02-13 | Disposition: A | Discharge status: Home / Self Care | Source: Ambulatory Visit | Attending: Sports Medicine | Admitting: Sports Medicine

## 2024-02-13 ENCOUNTER — Other Ambulatory Visit: Payer: Self-pay

## 2024-02-13 DIAGNOSIS — Z981 Arthrodesis status: Secondary | ICD-10-CM

## 2024-02-13 DIAGNOSIS — M542 Cervicalgia: Secondary | ICD-10-CM

## 2024-02-13 DIAGNOSIS — M47812 Spondylosis without myelopathy or radiculopathy, cervical region: Secondary | ICD-10-CM

## 2024-02-13 MED ORDER — TIRZEPATIDE 2.5 MG/0.5ML ~~LOC~~ SOAJ: 2.5000 mg | SUBCUTANEOUS | 0 refills | Status: AC

## 2024-02-13 MED ORDER — TRIAMCINOLONE ACETONIDE 40 MG/ML IJ SUSP (RADIOLOGY)
60.0000 mg | Freq: Once | INTRAMUSCULAR | Status: AC
  Administered 2024-02-13: 60 mg via EPIDURAL

## 2024-02-13 MED ORDER — IOPAMIDOL (ISOVUE-M 300) INJECTION 61%
1.0000 mL | Freq: Once | INTRAMUSCULAR | Status: AC | PRN
  Administered 2024-02-13: 1 mL via EPIDURAL

## 2024-02-14 NOTE — Telephone Encounter (Signed)
 Requested Prescriptions  Refused Prescriptions Disp Refills   MOUNJARO  2.5 MG/0.5ML Pen [Pharmacy Med Name: MOUNJARO  2.5 MG/0.5 ML PEN]      Sig: INJECT 2.5 MG SUBCUTANEOUSLY WEEKLY     Off-Protocol Failed - 02/14/2024 11:31 AM      Failed - Medication not assigned to a protocol, review manually.      Passed - Valid encounter within last 12 months    Recent Outpatient Visits           4 months ago Type 2 diabetes mellitus with diabetic mononeuropathy, without long-term current use of insulin  [facility])   [place] [facility] [place], Angeline ORN, NP       Future Appointments             In 2 weeks Leonce Katz, DO [facility] [place] [facility] at [facility]

## 2024-02-20 ENCOUNTER — Encounter: Payer: Self-pay | Admitting: Internal Medicine

## 2024-02-20 ENCOUNTER — Ambulatory Visit (INDEPENDENT_AMBULATORY_CARE_PROVIDER_SITE_OTHER): Admitting: Internal Medicine

## 2024-02-20 VITALS — BP 132/70 | Ht 69.0 in | Wt 159.4 lb

## 2024-02-20 DIAGNOSIS — F418 Other specified anxiety disorders: Secondary | ICD-10-CM | POA: Diagnosis not present

## 2024-02-20 DIAGNOSIS — E1141 Type 2 diabetes mellitus with diabetic mononeuropathy: Secondary | ICD-10-CM

## 2024-02-20 DIAGNOSIS — Z0001 Encounter for general adult medical examination with abnormal findings: Secondary | ICD-10-CM | POA: Diagnosis not present

## 2024-02-20 DIAGNOSIS — Z1231 Encounter for screening mammogram for malignant neoplasm of breast: Secondary | ICD-10-CM

## 2024-02-20 DIAGNOSIS — Z7985 Long-term (current) use of injectable non-insulin antidiabetic drugs: Secondary | ICD-10-CM

## 2024-02-20 NOTE — Assessment & Plan Note (Signed)
 Deteriorated due to family situation Encouraged therapy however she would like to hold off at this time Continue paroxetine  20 mg as previously prescribed

## 2024-02-20 NOTE — Patient Instructions (Signed)
 Health Maintenance for Postmenopausal Women Menopause is a normal process in which your ability to get pregnant comes to an end. This process happens slowly over many months or years, usually between the ages of 76 and 38. Menopause is complete when you have missed your menstrual period for 12 months. It is important to talk with your health care provider about some of the most common conditions that affect women after menopause (postmenopausal women). These include heart disease, cancer, and bone loss (osteoporosis). Adopting a healthy lifestyle and getting preventive care can help to promote your health and wellness. The actions you take can also lower your chances of developing some of these common conditions. What are the signs and symptoms of menopause? During menopause, you may have the following symptoms: Hot flashes. These can be moderate or severe. Night sweats. Decrease in sex drive. Mood swings. Headaches. Tiredness (fatigue). Irritability. Memory problems. Problems falling asleep or staying asleep. Talk with your health care provider about treatment options for your symptoms. Do I need hormone replacement therapy? Hormone replacement therapy is effective in treating symptoms that are caused by menopause, such as hot flashes and night sweats. Hormone replacement carries certain risks, especially as you become older. If you are thinking about using estrogen or estrogen with progestin, discuss the benefits and risks with your health care provider. How can I reduce my risk for heart disease and stroke? The risk of heart disease, heart attack, and stroke increases as you age. One of the causes may be a change in the body's hormones during menopause. This can affect how your body uses dietary fats, triglycerides, and cholesterol. Heart attack and stroke are medical emergencies. There are many things that you can do to help prevent heart disease and stroke. Watch your blood pressure High  blood pressure causes heart disease and increases the risk of stroke. This is more likely to develop in people who have high blood pressure readings or are overweight. Have your blood pressure checked: Every 3-5 years if you are 32-23 years of age. Every year if you are 31 years old or older. Eat a healthy diet  Eat a diet that includes plenty of vegetables, fruits, low-fat dairy products, and lean protein. Do not eat a lot of foods that are high in solid fats, added sugars, or sodium. Get regular exercise Get regular exercise. This is one of the most important things you can do for your health. Most adults should: Try to exercise for at least 150 minutes each week. The exercise should increase your heart rate and make you sweat (moderate-intensity exercise). Try to do strengthening exercises at least twice each week. Do these in addition to the moderate-intensity exercise. Spend less time sitting. Even light physical activity can be beneficial. Other tips Work with your health care provider to achieve or maintain a healthy weight. Do not use any products that contain nicotine or tobacco. These products include cigarettes, chewing tobacco, and vaping devices, such as e-cigarettes. If you need help quitting, ask your health care provider. Know your numbers. Ask your health care provider to check your cholesterol and your blood sugar (glucose). Continue to have your blood tested as directed by your health care provider. Do I need screening for cancer? Depending on your health history and family history, you may need to have cancer screenings at different stages of your life. This may include screening for: Breast cancer. Cervical cancer. Lung cancer. Colorectal cancer. What is my risk for osteoporosis? After menopause, you may be  at increased risk for osteoporosis. Osteoporosis is a condition in which bone destruction happens more quickly than new bone creation. To help prevent osteoporosis or  the bone fractures that can happen because of osteoporosis, you may take the following actions: If you are 24-54 years old, get at least 1,000 mg of calcium and at least 600 international units (IU) of vitamin D  per day. If you are older than age 75 but younger than age 30, get at least 1,200 mg of calcium and at least 600 international units (IU) of vitamin D  per day. If you are older than age 8, get at least 1,200 mg of calcium and at least 800 international units (IU) of vitamin D  per day. Smoking and drinking excessive alcohol increase the risk of osteoporosis. Eat foods that are rich in calcium and vitamin D , and do weight-bearing exercises several times each week as directed by your health care provider. How does menopause affect my mental health? Depression may occur at any age, but it is more common as you become older. Common symptoms of depression include: Feeling depressed. Changes in sleep patterns. Changes in appetite or eating patterns. Feeling an overall lack of motivation or enjoyment of activities that you previously enjoyed. Frequent crying spells. Talk with your health care provider if you think that you are experiencing any of these symptoms. General instructions See your health care provider for regular wellness exams and vaccines. This may include: Scheduling regular health, dental, and eye exams. Getting and maintaining your vaccines. These include: Influenza vaccine. Get this vaccine each year before the flu season begins. Pneumonia vaccine. Shingles vaccine. Tetanus, diphtheria, and pertussis (Tdap) booster vaccine. Your health care provider may also recommend other immunizations. Tell your health care provider if you have ever been abused or do not feel safe at home. Summary Menopause is a normal process in which your ability to get pregnant comes to an end. This condition causes hot flashes, night sweats, decreased interest in sex, mood swings, headaches, or lack  of sleep. Treatment for this condition may include hormone replacement therapy. Take actions to keep yourself healthy, including exercising regularly, eating a healthy diet, watching your weight, and checking your blood pressure and blood sugar levels. Get screened for cancer and depression. Make sure that you are up to date with all your vaccines. This information is not intended to replace advice given to you by your health care provider. Make sure you discuss any questions you have with your health care provider. Document Revised: 11/07/2020 Document Reviewed: 11/07/2020 Elsevier Patient Education  2024 [employer].

## 2024-02-20 NOTE — Progress Notes (Signed)
 Subjective:    Patient ID: Melinda [name], female    DOB: 1955-12-27, 68 y.o.   MRN: [id number]  HPI  Patient presents to clinic today for her annual exam.  Flu: 04/2022 Tetanus: 07/2019 COVID: X 6 Pneumovax: 06/2021 Prevnar: 06/2023 Shingrix: 03/2019 Pap smear: 08/2017 Mammogram: 05/2023 Bone density: 04/2022 Colon screening: 01/2022 Vision screening: annually Dentist: as needed  Diet: She does eat meat. She consumes fruits and veggies. She tries to avoid fried foods. She drinks mostly water , Dt. 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 b.) Doppler 06/28/2015: 40-59% BILATERAL ICA. c.) Dopplers 04/23/2019, 04/22/2020, 10/20/2021: 40-59% RICA and 1-39% LICA.   Blue toes    2nd toe on right foot, will get appt.   Bulging disc    CAD (coronary artery disease) 2009   a.) PCI with placement of a 15 x 30 mm Promus DES to mLAD. b.) LHC 02/27/2011: EF 60%; no sig CAD; mild lum irreg at ostium of diagnoal (jailed by stent).   Chicken pox    Chronic fatigue    Chronic, continuous use of opioids    CKD (chronic kidney disease), stage III (HCC)    COVID 02/22/2022   Degenerative disc disease    Depression    Diverticulosis    Facet joint disease    Failed cervical fusion    Fibromyalgia    Hiatal hernia    Hyperlipidemia    IBS (irritable bowel syndrome)    MCL deficiency, knee    MRSA (methicillin resistant staph aureus) culture positive 2011   GREAT TOE RIGHT FOOT   Neuropathy 04/18/2010   Orthostatic hypotension 01/2020   a.) multiple pre-syncopal episodes; takes proamatine    OSA on CPAP 08/17/2003   Osteoarthritis    a.)  back, neck, hands, knees   Restless leg syndrome    Sepsis (HCC) 09/2013   Spinal headache 11/07/2021   Spinal stenosis    T2DM (type 2 diabetes mellitus) (HCC)    Wolff-Parkinson-White (WPW) syndrome 1992   a.) s/p ablations; unsuccessful. Now has rare episodes    Current Outpatient Medications  Medication Sig Dispense Refill   ACCU-CHEK GUIDE test strip USE 1 EACH BY OTHER ROUTE 3 (THREE) TIMES DAILY. USE AS INSTRUCTE 200 strip 1   Accu-Chek Softclix Lancets lancets USE TO CHECK BLOOD SUGAR 1 TIME DAILY 100 each 2   Ascorbic Acid (VITAMIN C) 1000 MG tablet Take 1,000 mg by mouth daily.     ASPIRIN  81 PO Take 81 mg by mouth daily.     Biotin 89999 MCG TABS Take 10,000 mcg by mouth daily.     Blood Glucose Calibration (ACCU-CHEK GUIDE CONTROL) LIQD 1 each by In Vitro route as needed. 1 each 1   buPROPion  (WELLBUTRIN  XL) 150 MG 24 hr tablet TAKE 1 TABLET BY MOUTH EVERY DAY 90 tablet 0   CALCIUM -MAGNESIUM-ZINC  PO Take 3 capsules by mouth daily.     CINNAMON PO Take 1,000 mg by mouth daily.     Coenzyme Q10 (CO Q 10 PO) Take 1 tablet by mouth daily.     ezetimibe  (ZETIA ) 10 MG tablet Take 1  tablet (10 mg total) by mouth daily. 90 tablet 3   fluticasone  (FLONASE ) 50 MCG/ACT nasal spray Place 2 sprays into both nostrils daily. (Patient taking differently: Place 2 sprays into both nostrils daily. TAKES PRN) 16 g 0   glucosamine-chondroitin 500-400 MG tablet Take 2 tablets by mouth daily.      nitroGLYCERIN  (NITROSTAT ) 0.4 MG SL tablet PLACE 1 TABLET UNDER THE TONGUE EVERY 5 (FIVE) MINUTES AS NEEDED FOR CHEST PAIN. 25 tablet 4   Omega-3 Fatty Acids (FISH OIL) 1200 MG CAPS Take 1,200 mg by mouth in the morning.      PARoxetine  (PAXIL ) 20 MG tablet TAKE 1/2 TABLET BY MOUTH DAILY IN THE MORNING 45 tablet 0   pregabalin  (LYRICA ) 150 MG capsule Take 1 capsule (150 mg total) by mouth in the morning, at noon, and at bedtime. 270 capsule 0   rosuvastatin  (CRESTOR ) 20 MG tablet TAKE 1 TABLET BY MOUTH  EVERY DAY 90 tablet 3   tirzepatide  (MOUNJARO ) 2.5 MG/0.5ML Pen Inject 2.5 mg into the skin once a week. 6 mL 0   tiZANidine  (ZANAFLEX ) 2 MG tablet TAKE 1 TABLET (2 MG TOTAL) BY MOUTH IN THE MORNING AND AT BEDTIME. 180 tablet 1   vitamin E  180 MG (400 UNITS) capsule Take 400 Units by mouth daily.     No current facility-administered medications for this visit.    No Known Allergies  Family History  Problem Relation Age of Onset   Lung cancer Mother    Arthritis Father    Heart disease Father    Diabetes Father    Dementia Maternal Grandmother    Alcohol abuse Paternal Grandfather    Heart disease Paternal Grandfather    Stroke Paternal Grandfather    BRCA 1/2 Neg Hx    Breast cancer Neg Hx     Social History   Socioeconomic History   Marital status: Significant Other    Spouse name: Not on file   Number of children: Not on file   Years of education: Not on file   Highest education level: GED or equivalent  Occupational History   Not on file  Tobacco Use   Smoking status: Former    Current packs/day: 0.00    Average packs/day: 1.5 packs/day for 32.0 years (48.0 ttl pk-yrs)    Types: Cigarettes    Start date: 07/22/1975    Quit date: 07/22/2007    Years since quitting: 16.5   Smokeless tobacco: Former  Building services engineer status: Never Used  Substance and Sexual Activity   Alcohol use: Not Currently    Comment: occ - Holidays   Drug use: Not Currently    Comment: prescribed pain pump and oxy   Sexual activity: Yes  Other Topics Concern   Not on file  Social History Narrative   Lives at home with a partner. Independent at baseline   Social Drivers of Health   Financial Resource Strain: Low Risk  (12/20/2023)   Overall Financial Resource Strain (CARDIA)    Difficulty of Paying Living Expenses: Not hard at all  Food Insecurity: No Food Insecurity (12/20/2023)   Hunger Vital Sign    Worried About Running Out of Food in the Last Year: Never true    Ran Out of Food  in the Last Year: Never true  Transportation Needs: No Transportation Needs (12/20/2023)   PRAPARE - Administrator, Civil Service (Medical): No    Lack of Transportation (Non-Medical): No  Physical Activity: Insufficiently  Active (12/20/2023)   Exercise Vital Sign    Days of Exercise per Week: 1 day    Minutes of Exercise per Session: 20 min  Stress: No Stress Concern Present (12/20/2023)   [employer] of Occupational Health - Occupational Stress Questionnaire    Feeling of Stress: Only a little  Social Connections: Moderately Integrated (12/20/2023)   Social Connection and Isolation Panel    Frequency of Communication with Friends and Family: More than three times a week    Frequency of Social Gatherings with Friends and Family: Once a week    Attends Religious Services: Never    Database administrator or Organizations: No    Attends Engineer, structural: More than 4 times per year    Marital Status: Living with partner  Recent Concern: Social Connections - Moderately Isolated (12/17/2023)   Social Connection and Isolation Panel    Frequency of Communication with Friends and Family: More than three times a week    Frequency of Social Gatherings with Friends and Family: Once a week    Attends Religious Services: Never    Database administrator or Organizations: No    Attends Engineer, structural: Not on file    Marital Status: Living with partner  Intimate Partner Violence: Not At Risk (12/20/2023)   Humiliation, Afraid, Rape, and Kick questionnaire    Fear of Current or Ex-Partner: No    Emotionally Abused: No    Physically Abused: No    Sexually Abused: No     Constitutional: Denies fever, malaise, fatigue, headache or abrupt weight changes.  HEENT: Denies eye pain, eye redness, ear pain, ringing in the ears, wax buildup, runny nose, nasal congestion, bloody nose, or sore throat. Respiratory: Denies difficulty breathing, shortness of breath,  cough or sputum production.   Cardiovascular: Denies chest pain, chest tightness, palpitations or swelling in the hands or feet.  Gastrointestinal: Denies abdominal pain, bloating, constipation, diarrhea or blood in the stool.  GU: Denies urgency, frequency, pain with urination, burning sensation, blood in urine, odor or discharge. Musculoskeletal: Patient reports chronic joint and muscle pain, right knee instability.  Denies decrease in range of motion, difficulty with gait, or joint swelling.  Skin: Denies redness, rashes, lesions or ulcercations.  Neurological: Patient reports neuropathic pain, balance problem, difficulty focusing, difficulty completing task.  Denies dizziness, difficulty with memory, difficulty with speech or problems with coordination.  Psych: Patient has a history of anxiety and depression.  Denies SI/HI.  No other specific complaints in a complete review of systems (except as listed in HPI above).     Objective:   Physical Exam BP 132/70 (BP Location: Left Arm, Patient Position: Sitting, Cuff Size: Normal)   Ht 5' 9 (1.753 m)   Wt 159 lb 6.4 oz (72.3 kg)   LMP  (LMP Unknown)   BMI 23.54 kg/m    Wt Readings from Last 3 Encounters:  01/28/24 158 lb (71.7 kg)  09/26/23 169 lb 6.4 oz (76.8 kg)  04/10/23 188 lb (85.3 kg)    General: Appears her  stated age, in NAD. Skin: Warm, dry and intact. No ulcerations noted. HEENT: Head: normal shape and size; Eyes: sclera white, no icterus, conjunctiva pink, PERRLA and EOMs intact;  Neck:  Neck supple, trachea midline. No masses, lumps or thyromegaly present.  Cardiovascular: Normal rate and rhythm. S1,S2 noted.  No murmur, rubs or gallops noted. No JVD or BLE edema. No carotid bruits noted. Pulmonary/Chest: Normal effort and positive vesicular  breath sounds. No respiratory distress. No wheezes, rales or ronchi noted.  Abdomen: Normal bowel sounds.  Musculoskeletal: Joint enlargement noted in right knee. Scarring noted  over the right knee. Strength 5/5 BUE/BLE. Gait slow and steady without device. Neurological: Alert and oriented. Cranial nerves II-XII grossly intact. Sensation decreased to BLE. Coordination normal.  Psychiatric: Mood and affect normal. Tearful.Judgment and thought content normal.    BMET    Component Value Date/Time   NA 141 09/26/2023 1349   NA 139 10/23/2021 1552   NA 135 (L) 06/30/2013 1203   K 4.7 09/26/2023 1349   K 4.1 06/30/2013 1203   CL 103 09/26/2023 1349   CL 102 06/30/2013 1203   CO2 30 09/26/2023 1349   CO2 32 06/30/2013 1203   GLUCOSE 78 09/26/2023 1349   GLUCOSE 143 (H) 06/30/2013 1203   BUN 19 09/26/2023 1349   BUN 21 10/23/2021 1552   BUN 19 (H) 06/30/2013 1203   CREATININE 1.05 09/26/2023 1349   CALCIUM  9.3 09/26/2023 1349   CALCIUM  9.7 06/30/2013 1203   GFRNONAA 56 (L) 12/21/2020 0957   GFRAA 65 12/21/2020 0957    Lipid Panel     Component Value Date/Time   CHOL 107 09/26/2023 1349   TRIG 57 09/26/2023 1349   HDL 50 09/26/2023 1349   CHOLHDL 2.1 09/26/2023 1349   VLDL 31.6 03/12/2019 1255   LDLCALC 44 09/26/2023 1349    CBC    Component Value Date/Time   WBC 4.2 09/26/2023 1349   RBC 4.32 09/26/2023 1349   HGB 12.5 09/26/2023 1349   HGB 14.0 11/12/2011 1136   HCT 37.8 09/26/2023 1349   HCT 40.7 11/12/2011 1136   PLT 180 09/26/2023 1349   PLT 274 11/12/2011 1136   MCV 87.5 09/26/2023 1349   MCV 86 11/12/2011 1136   MCH 28.9 09/26/2023 1349   MCHC 33.1 09/26/2023 1349   RDW 12.1 09/26/2023 1349   RDW 12.8 11/12/2011 1136   LYMPHSABS 1.4 09/06/2020 1330   LYMPHSABS 2.5 11/12/2011 1136   MONOABS 0.5 09/06/2020 1330   MONOABS 0.8 11/12/2011 1136   EOSABS 0.2 09/06/2020 1330   EOSABS 0.6 11/12/2011 1136   BASOSABS 0.1 09/06/2020 1330   BASOSABS 0.1 11/12/2011 1136    Hgb A1C Lab Results  Component Value Date   HGBA1C 5.4 09/26/2023           Assessment & Plan:   Preventative health maintenance:  Encouraged her to get a  flu shot in fall Tetanus UTD COVID-vaccine UTD Pneumovax and Prevnar 20 UTD Encouraged her to get her second Shingrix vaccine She no longer needs to screen for cervical cancer Mammogram ordered-she will call to schedule Bone density UTD Colon screening UTD Encouraged to consume a balanced diet and exercise regimen Advised her to see an eye doctor and dentist annually We will check CBC, c-Met, lipid, A1c and urine microalbumin today  RTC in 6 months, follow-up chronic conditions Angeline Laura, NP

## 2024-02-21 ENCOUNTER — Ambulatory Visit: Payer: Self-pay | Admitting: Internal Medicine

## 2024-02-21 LAB — CBC
HCT: 38.4 % (ref 35.0–45.0)
Hemoglobin: 12.7 g/dL (ref 11.7–15.5)
MCH: 29.4 pg (ref 27.0–33.0)
MCHC: 33.1 g/dL (ref 32.0–36.0)
MCV: 88.9 fL (ref 80.0–100.0)
MPV: 9.2 fL (ref 7.5–12.5)
Platelets: 226 Thousand/uL (ref 140–400)
RBC: 4.32 Million/uL (ref 3.80–5.10)
RDW: 11.9 % (ref 11.0–15.0)
WBC: 5.9 Thousand/uL (ref 3.8–10.8)

## 2024-02-21 LAB — COMPREHENSIVE METABOLIC PANEL WITH GFR
AG Ratio: 1.9 (calc) (ref 1.0–2.5)
ALT: 66 U/L — ABNORMAL HIGH (ref 6–29)
AST: 34 U/L (ref 10–35)
Albumin: 4.6 g/dL (ref 3.6–5.1)
Alkaline phosphatase (APISO): 116 U/L (ref 37–153)
BUN/Creatinine Ratio: 14 (calc) (ref 6–22)
BUN: 23 mg/dL (ref 7–25)
CO2: 28 mmol/L (ref 20–32)
Calcium: 9.1 mg/dL (ref 8.6–10.4)
Chloride: 104 mmol/L (ref 98–110)
Creat: 1.7 mg/dL — ABNORMAL HIGH (ref 0.50–1.05)
Globulin: 2.4 g/dL (ref 1.9–3.7)
Glucose, Bld: 121 mg/dL — ABNORMAL HIGH (ref 65–99)
Potassium: 4.3 mmol/L (ref 3.5–5.3)
Sodium: 140 mmol/L (ref 135–146)
Total Bilirubin: 0.5 mg/dL (ref 0.2–1.2)
Total Protein: 7 g/dL (ref 6.1–8.1)
eGFR: 33 mL/min/1.73m2 — ABNORMAL LOW (ref >=60)

## 2024-02-21 LAB — MICROALBUMIN / CREATININE URINE RATIO
Creatinine, Urine: 201 mg/dL (ref 20–275)
Microalb Creat Ratio: 3 mg/g{creat} (ref <30)
Microalb, Ur: 0.7 mg/dL

## 2024-02-21 LAB — LIPID PANEL
Cholesterol: 112 mg/dL (ref <200)
HDL: 55 mg/dL (ref >=50)
LDL Cholesterol (Calc): 43 mg/dL
Non-HDL Cholesterol (Calc): 57 mg/dL (ref <130)
Total CHOL/HDL Ratio: 2 (calc) (ref <5.0)
Triglycerides: 56 mg/dL (ref <150)

## 2024-02-21 LAB — HEMOGLOBIN A1C
Hgb A1c MFr Bld: 5.2 % (ref <5.7)
Mean Plasma Glucose: 103 mg/dL
eAG (mmol/L): 5.7 mmol/L

## 2024-02-27 NOTE — Progress Notes (Unsigned)
 [name] [address] [place] [address] Phone: [phone]   Assessment and Plan:     1. Neck pain (Primary) 2. Cervical spondylosis 3. History of fusion of cervical spine 4. Left arm pain 5. Numbness and tingling of both upper extremities -Chronic with exacerbation, subsequent visit - Continued numbness and tingling into bilateral upper extremities.  Patient did have decrease in left upper extremity numbness and tingling after repeat epidural CSI performed on 02/13/2024, however she continues to experience intermittent left upper extremity numbness and tingling.  Patient continues to experience right arm pain, numbness, tingling which was most consistent with medial epicondylitis at previous office visit, though patient complains of a sharp nerve pain sensation along medial bicep with certain upper extremity motions - With inconsistent bilateral upper extremity neurologic symptoms, and incomplete improvement with epidural CSI, recommend bilateral upper extremity nerve conduction study - Will try and limit p.o. prednisone  and NSAIDs due to past medical history of DM type II, CKD - Use Tylenol  500 to 1000 mg tablets 2-3 times a day for day-to-day pain relief - Continue home exercises as tolerated    Pertinent previous records reviewed include epidural procedure note 02/13/2024   Follow Up: 1 week after nerve conduction study to review results.  Could consider CSI based on nerve conduction study.  Could consider repeat epidural CSI.  Could consider short course of NSAIDs versus prednisone .  Could consider physical therapy.   Subjective:   I, Melinda [name], am serving as a Neurosurgeon for Doctor Morene Mace  Chief Complaint: right elbow pain    HPI:    01/28/2024 Patient is a 67 year old female with right elbow pain. Patient states she has been using an elbow strap for the elbow pain. It has been helping. Moist heat helps,  rest when it gets really bad, and has to put it against something. Left hand goes numb. Lightening strike pain in her left hand in certain positions. Holding the phone causes pain.    02/28/2024 Patient states after injection numbness and tingling has improved. Only short flares. Thoracic low back flare 3 days ago when she turned her  body. Right bicep nerve pain 3 days ago when she moves her arm.    Relevant Historical Information: Neck fusion and cage  Additional pertinent review of systems negative.   Current Outpatient Medications:    ACCU-CHEK GUIDE test strip, USE 1 EACH BY OTHER ROUTE 3 (THREE) TIMES DAILY. USE AS INSTRUCTE, Disp: 200 strip, Rfl: 1   Accu-Chek Softclix Lancets lancets, USE TO CHECK BLOOD SUGAR 1 TIME DAILY, Disp: 100 each, Rfl: 2   Ascorbic Acid (VITAMIN C) 1000 MG tablet, Take 1,000 mg by mouth daily., Disp: , Rfl:    ASPIRIN  81 PO, Take 81 mg by mouth daily., Disp: , Rfl:    Biotin 89999 MCG TABS, Take 10,000 mcg by mouth daily., Disp: , Rfl:    Blood Glucose Calibration (ACCU-CHEK GUIDE CONTROL) LIQD, 1 each by In Vitro route as needed., Disp: 1 each, Rfl: 1   buPROPion  (WELLBUTRIN  XL) 150 MG 24 hr tablet, TAKE 1 TABLET BY MOUTH EVERY DAY, Disp: 90 tablet, Rfl: 0   CALCIUM -MAGNESIUM-ZINC  PO, Take 3 capsules by mouth daily., Disp: , Rfl:    CINNAMON PO, Take 1,000 mg by mouth daily., Disp: , Rfl:    Coenzyme Q10 (CO Q 10 PO), Take 1 tablet by mouth daily., Disp: , Rfl:    ezetimibe  (ZETIA ) 10 MG  tablet, Take 1 tablet (10 mg total) by mouth daily., Disp: 90 tablet, Rfl: 3   glucosamine-chondroitin 500-400 MG tablet, Take 2 tablets by mouth daily. , Disp: , Rfl:    nitroGLYCERIN  (NITROSTAT ) 0.4 MG SL tablet, PLACE 1 TABLET UNDER THE TONGUE EVERY 5 (FIVE) MINUTES AS NEEDED FOR CHEST PAIN., Disp: 25 tablet, Rfl: 4   Omega-3 Fatty Acids (FISH OIL) 1200 MG CAPS, Take 1,200 mg by mouth in the morning. , Disp: , Rfl:    PARoxetine  (PAXIL ) 20 MG tablet, TAKE 1/2 TABLET BY  MOUTH DAILY IN THE MORNING, Disp: 45 tablet, Rfl: 0   pregabalin  (LYRICA ) 150 MG capsule, Take 1 capsule (150 mg total) by mouth in the morning, at noon, and at bedtime., Disp: 270 capsule, Rfl: 0   rosuvastatin  (CRESTOR ) 20 MG tablet, TAKE 1 TABLET BY MOUTH EVERY DAY, Disp: 90 tablet, Rfl: 3   tirzepatide  (MOUNJARO ) 2.5 MG/0.5ML Pen, Inject 2.5 mg into the skin once a week., Disp: 6 mL, Rfl: 0   tiZANidine  (ZANAFLEX ) 2 MG tablet, TAKE 1 TABLET (2 MG TOTAL) BY MOUTH IN THE MORNING AND AT BEDTIME., Disp: 180 tablet, Rfl: 1   vitamin E  180 MG (400 UNITS) capsule, Take 400 Units by mouth daily., Disp: , Rfl:    Objective:     Vitals:   02/28/24 1353  Pulse: 60  SpO2: 98%  Weight: 159 lb (72.1 kg)  Height: 5' 9 (1.753 m)      Body mass index is 23.48 kg/m.    Physical Exam:    Neck Exam: Cervical Spine- Posture normal Skin- normal, intact   Neuro:  Strength-   Right Left  Deltoid (C5) 5/5 5/5 Bicep/Brachioradialis (C5/6) 5/5  5/5 Wrist Extension (C6) 5/5 5/5 Tricep (C7) 5/5 5/5 Wrist Flexion (C7) 5/5 5/5 Grip (C8) 5/5 5/5 Finger Abduction (T1) 5/5 5/5   Sensation: Decreased sensation over left thumb compared to right Otherwise intact to light touch in upper extremities bilaterally.  Burning nerve pain along medial biceps musculature was not reproducible on physical exam in clinic.   Spurling's: Negative on left, negative on right Neck ROM: Significantly decreased bilateral sidebending and left rotation.   Electronically signed by:  Odis Mace [name] 2:30 PM 02/28/24

## 2024-02-28 ENCOUNTER — Ambulatory Visit: Admitting: Sports Medicine

## 2024-02-28 VITALS — HR 60 | Ht 69.0 in | Wt 159.0 lb

## 2024-02-28 DIAGNOSIS — R200 Anesthesia of skin: Secondary | ICD-10-CM

## 2024-02-28 DIAGNOSIS — Z981 Arthrodesis status: Secondary | ICD-10-CM | POA: Diagnosis not present

## 2024-02-28 DIAGNOSIS — M79602 Pain in left arm: Secondary | ICD-10-CM

## 2024-02-28 DIAGNOSIS — M542 Cervicalgia: Secondary | ICD-10-CM

## 2024-02-28 DIAGNOSIS — M47812 Spondylosis without myelopathy or radiculopathy, cervical region: Secondary | ICD-10-CM | POA: Diagnosis not present

## 2024-02-28 DIAGNOSIS — R202 Paresthesia of skin: Secondary | ICD-10-CM

## 2024-02-28 NOTE — Patient Instructions (Signed)
 Bilat nerve conduction   Follow up 1 week after nerve conduction study

## 2024-03-05 ENCOUNTER — Telehealth: Payer: Self-pay | Admitting: Sports Medicine

## 2024-03-05 NOTE — Telephone Encounter (Signed)
 Received a fax from [facility] stating that they are not able to see the patient due to her insurance being out of network. Dr Leonce would like for the patient to set up a Telemed follow up to discuss options.  Left message for patient to call back to schedule.

## 2024-03-13 NOTE — Progress Notes (Signed)
 Ben Verba Ainley D.[facility]MENTE[facility] [address] [place] [address] Phone: [phone]   Assessment and Plan:     1. Left wrist pain (Primary) 2. Left carpal tunnel syndrome -Chronic with exacerbation, initial visit - Continued intermittent bilateral upper extremity neurologic symptoms with most prominent being left wrist numbness and tingling consistent with left carpal tunnel due to increased physical activity crocheting - Patient elected for carpal tunnel CSI.  Tolerated well per note below.  CSI may temporarily increase blood glucose in patient with past medical history of DM type II - Start HEP for carpal tunnel  Procedure: Ultrasound Guided Carpal Tunnel Injection Side: Left Diagnosis: Carpal tunnel syndrome US  Indication:  - accuracy is paramount for diagnosis - to ensure therapeutic efficacy or procedural success - to reduce procedural risk  After explaining the procedure, viable alternatives, risks, and answering any questions, consent was given verbally. The site was cleaned with chlorhexidine  prep. An ultrasound transducer was placed on the volar aspect of the wrist.  The median nerve was identified.   An injection was performed under ultrasound guidance from the ulnar approach with care used to avoid ulnar artery.  1ml of 1% lidocaine  without epinephrine  was used to hydrodissect the median nerve from the retinaculum.  40 mg of triamcinolone  (KENALOG ) 40mg /ml was then deposited adjacent to but not into the median nerve.  This was well tolerated and resulted in  relief.  Needle was removed and dressing placed and post injection instructions were given including  a discussion of likely return of pain today after the anesthetic wears off (with the possibility of worsened pain) until the steroid starts to work in 1-3 days.   Pt was advised to call or return to clinic if these symptoms worsen or fail to improve as anticipated. Images permanently  stored.  15 additional minutes spent for educating Therapeutic Home Exercise Program.  This included exercises focusing on stretching, strengthening, with focus on eccentric aspects.   Long term goals include an improvement in range of motion, strength, endurance as well as avoiding reinjury. Patient's frequency would include in 1-2 times a day, 3-5 times a week for a duration of 6-12 weeks. Proper technique shown and discussed handout in great detail with ATC.  All questions were discussed and answered.    3. Neck pain 4. Cervical spondylosis 5. History of fusion of cervical spine 6. Numbness and tingling of both upper extremities -Chronic with exacerbation, subsequent visit - Continued gradual improvement after epidural CSI to left-sided C6/7 on 02/13/2024 which has provided decrease numbness and tingling in bilateral upper extremities.  Still intermittent numbness and tingling through right upper extremity consistent with cervical radiculopathy - May continue Lyrica  - We referred patient for EMG, however [facility] said they do not take Medicare.  Patient is not interested in referral to neurology to establish care and then potentially have an EMG ordered through their office at this time - Recommend limiting p.o. prednisone  and NSAIDs due to past medical history of DM type II and CKD - Continue home exercises    Pertinent previous records reviewed include none   Follow Up: 3 to 4 weeks for reevaluation.  If no improvement or worsening of symptoms, could consider repeat epidural CSI versus repeat C-spine MRI versus referral to neurology for EMG versus repeat carpal tunnel CSI   Subjective:   I, [name], am serving as a Neurosurgeon for Doctor [employer]   Chief Complaint: right elbow pain  HPI:    01/28/2024 Patient is a 68 year old female with right elbow pain. Patient states she has been using an elbow strap for the elbow pain. It has been helping. Moist heat helps,  rest when it gets really bad, and has to put it against something. Left hand goes numb. Lightening strike pain in her left hand in certain positions. Holding the phone causes pain.     02/28/2024 Patient states after injection numbness and tingling has improved. Only short flares. Thoracic low back flare 3 days ago when she turned her  body. Right bicep nerve pain 3 days ago when she moves her arm.   03/16/2024 Patient states that she is okay. Not a morning person   Relevant Historical Information: Neck fusion and cage    Additional pertinent review of systems negative.   Current Outpatient Medications:    ACCU-CHEK GUIDE test strip, USE 1 EACH BY OTHER ROUTE 3 (THREE) TIMES DAILY. USE AS INSTRUCTE, Disp: 200 strip, Rfl: 1   Accu-Chek Softclix Lancets lancets, USE TO CHECK BLOOD SUGAR 1 TIME DAILY, Disp: 100 each, Rfl: 2   Ascorbic Acid (VITAMIN C) 1000 MG tablet, Take 1,000 mg by mouth daily., Disp: , Rfl:    ASPIRIN  81 PO, Take 81 mg by mouth daily., Disp: , Rfl:    Biotin 89999 MCG TABS, Take 10,000 mcg by mouth daily., Disp: , Rfl:    Blood Glucose Calibration (ACCU-CHEK GUIDE CONTROL) LIQD, 1 each by In Vitro route as needed., Disp: 1 each, Rfl: 1   buPROPion  (WELLBUTRIN  XL) 150 MG 24 hr tablet, TAKE 1 TABLET BY MOUTH EVERY DAY, Disp: 90 tablet, Rfl: 0   CALCIUM -MAGNESIUM-ZINC  PO, Take 3 capsules by mouth daily., Disp: , Rfl:    CINNAMON PO, Take 1,000 mg by mouth daily., Disp: , Rfl:    Coenzyme Q10 (CO Q 10 PO), Take 1 tablet by mouth daily., Disp: , Rfl:    ezetimibe  (ZETIA ) 10 MG tablet, Take 1 tablet (10 mg total) by mouth daily., Disp: 90 tablet, Rfl: 3   glucosamine-chondroitin 500-400 MG tablet, Take 2 tablets by mouth daily. , Disp: , Rfl:    nitroGLYCERIN  (NITROSTAT ) 0.4 MG SL tablet, PLACE 1 TABLET UNDER THE TONGUE EVERY 5 (FIVE) MINUTES AS NEEDED FOR CHEST PAIN., Disp: 25 tablet, Rfl: 4   Omega-3 Fatty Acids (FISH OIL) 1200 MG CAPS, Take 1,200 mg by mouth in the morning. ,  Disp: , Rfl:    PARoxetine  (PAXIL ) 20 MG tablet, TAKE 1/2 TABLET BY MOUTH DAILY IN THE MORNING, Disp: 45 tablet, Rfl: 0   pregabalin  (LYRICA ) 150 MG capsule, Take 1 capsule (150 mg total) by mouth in the morning, at noon, and at bedtime., Disp: 270 capsule, Rfl: 0   rosuvastatin  (CRESTOR ) 20 MG tablet, TAKE 1 TABLET BY MOUTH EVERY DAY, Disp: 90 tablet, Rfl: 3   tirzepatide  (MOUNJARO ) 2.5 MG/0.5ML Pen, Inject 2.5 mg into the skin once a week., Disp: 6 mL, Rfl: 0   tiZANidine  (ZANAFLEX ) 2 MG tablet, TAKE 1 TABLET (2 MG TOTAL) BY MOUTH IN THE MORNING AND AT BEDTIME., Disp: 180 tablet, Rfl: 1   vitamin E  180 MG (400 UNITS) capsule, Take 400 Units by mouth daily., Disp: , Rfl:    Objective:     Vitals:   03/16/24 0812  BP: (!) 140/82  Pulse: 75  SpO2: 98%  Weight: 161 lb (73 kg)  Height: 5' 9 (1.753 m)      Body mass index is 23.78 kg/m.  Physical Exam:    General: Appears well, nad, nontoxic and pleasant Neuro:sensation intact, strength is 5/5 in upper extremities, muscle tone wnl Skin:no susupicious lesions or rashes  Left hand/Wrist:   No deformity or swelling appreciated. ROM  Ext 90, flexion 70, radial/ulnar deviation 30 nttp over the snuff box, dorsal carpals, volar carpals, radial styloid, ulnar styloid, 1st mcp, tfcc Positive Tinel's, Negative Phalen's, Prayer Tests Negative finklestein Neg tfcc bounce test No pain with resisted ext, flex or deviation    Electronically signed by:  Odis Mace [facility] 8:44 AM 03/16/24

## 2024-03-16 ENCOUNTER — Ambulatory Visit (INDEPENDENT_AMBULATORY_CARE_PROVIDER_SITE_OTHER): Admitting: Sports Medicine

## 2024-03-16 ENCOUNTER — Other Ambulatory Visit: Payer: Self-pay

## 2024-03-16 VITALS — BP 140/82 | HR 75 | Ht 69.0 in | Wt 161.0 lb

## 2024-03-16 DIAGNOSIS — M25532 Pain in left wrist: Secondary | ICD-10-CM

## 2024-03-16 DIAGNOSIS — M47812 Spondylosis without myelopathy or radiculopathy, cervical region: Secondary | ICD-10-CM

## 2024-03-16 DIAGNOSIS — G5602 Carpal tunnel syndrome, left upper limb: Secondary | ICD-10-CM | POA: Diagnosis not present

## 2024-03-16 DIAGNOSIS — R200 Anesthesia of skin: Secondary | ICD-10-CM

## 2024-03-16 DIAGNOSIS — Z981 Arthrodesis status: Secondary | ICD-10-CM

## 2024-03-16 DIAGNOSIS — M542 Cervicalgia: Secondary | ICD-10-CM | POA: Diagnosis not present

## 2024-03-16 DIAGNOSIS — R202 Paresthesia of skin: Secondary | ICD-10-CM

## 2024-03-16 NOTE — Patient Instructions (Addendum)
 Carpal tunnel HEP  3-4 week follow up

## 2024-04-13 ENCOUNTER — Ambulatory Visit: Admitting: Sports Medicine

## 2024-04-13 VITALS — HR 62 | Ht 69.0 in | Wt 185.0 lb

## 2024-04-13 DIAGNOSIS — M25532 Pain in left wrist: Secondary | ICD-10-CM

## 2024-04-13 DIAGNOSIS — M47812 Spondylosis without myelopathy or radiculopathy, cervical region: Secondary | ICD-10-CM | POA: Diagnosis not present

## 2024-04-13 DIAGNOSIS — M7701 Medial epicondylitis, right elbow: Secondary | ICD-10-CM

## 2024-04-13 DIAGNOSIS — R200 Anesthesia of skin: Secondary | ICD-10-CM

## 2024-04-13 DIAGNOSIS — G5602 Carpal tunnel syndrome, left upper limb: Secondary | ICD-10-CM | POA: Diagnosis not present

## 2024-04-13 DIAGNOSIS — Z981 Arthrodesis status: Secondary | ICD-10-CM

## 2024-04-13 DIAGNOSIS — R202 Paresthesia of skin: Secondary | ICD-10-CM

## 2024-04-13 DIAGNOSIS — M542 Cervicalgia: Secondary | ICD-10-CM | POA: Diagnosis not present

## 2024-04-13 NOTE — Progress Notes (Signed)
 Ben Deshane Cotroneo D.[facility] [address] [place] [address] Phone: [phone]   Assessment and Plan:     1. Left wrist pain (Primary) 2. Left carpal tunnel syndrome -Chronic with exacerbation, subsequent visit - Overall improvement in numbness and tingling sensations into left wrist and hand after carpal tunnel CSI at previous office visit on 03/16/2024 - Continue HEP  3. Neck pain 4. Cervical spondylosis 5. History of fusion of cervical spine 6. Numbness and tingling of both upper extremities 7. Medial epicondylitis of elbow, right -Chronic with exacerbation, subsequent visit - Patient has had overall improved symptoms of neck pain, numbness and tingling radiating into bilateral upper extremities, worse on right most consistent with cervical radiculopathy.  Overall the symptoms are currently well-controlled with HEP, epidural CSI 02/13/2024, Lyrica .  Patient's medial right elbow pain is most consistent with medial epicondylitis recurrent flare at today's visit likely flared by physical activity while traveling -Restart HEP for golfers elbow  Pertinent previous records reviewed include none   Follow Up: As needed if no improvement or worsening of symptoms.  Could consider repeat epidural CSI versus repeat carpal tunnel CSI versus repeat C-spine MRI with last in 2022 versus referral to neurology for EMG   Subjective:   I, [name], am serving as a Neurosurgeon for Doctor Morene Mace   Chief Complaint: right elbow pain    HPI:    01/28/2024 Patient is a 68 year old female with right elbow pain. Patient states she has been using an elbow strap for the elbow pain. It has been helping. Moist heat helps, rest when it gets really bad, and has to put it against something. Left hand goes numb. Lightening strike pain in her left hand in certain positions. Holding the phone causes pain.     02/28/2024 Patient states after injection numbness  and tingling has improved. Only short flares. Thoracic low back flare 3 days ago when she turned her  body. Right bicep nerve pain 3 days ago when she moves her arm.    03/16/2024 Patient states that she is okay. Not a morning person  04/13/2024 Patient states okay intermittent flares of pain with ROM . R elbow pain with pushing motion ie opening medicine bottle    Relevant Historical Information: Neck fusion and cage  Additional pertinent review of systems negative.   Current Outpatient Medications:    ACCU-CHEK GUIDE test strip, USE 1 EACH BY OTHER ROUTE 3 (THREE) TIMES DAILY. USE AS INSTRUCTE, Disp: 200 strip, Rfl: 1   Accu-Chek Softclix Lancets lancets, USE TO CHECK BLOOD SUGAR 1 TIME DAILY, Disp: 100 each, Rfl: 2   Ascorbic Acid (VITAMIN C) 1000 MG tablet, Take 1,000 mg by mouth daily., Disp: , Rfl:    ASPIRIN  81 PO, Take 81 mg by mouth daily., Disp: , Rfl:    Biotin 89999 MCG TABS, Take 10,000 mcg by mouth daily., Disp: , Rfl:    Blood Glucose Calibration (ACCU-CHEK GUIDE CONTROL) LIQD, 1 each by In Vitro route as needed., Disp: 1 each, Rfl: 1   buPROPion  (WELLBUTRIN  XL) 150 MG 24 hr tablet, TAKE 1 TABLET BY MOUTH EVERY DAY, Disp: 90 tablet, Rfl: 0   CALCIUM -MAGNESIUM-ZINC  PO, Take 3 capsules by mouth daily., Disp: , Rfl:    CINNAMON PO, Take 1,000 mg by mouth daily., Disp: , Rfl:    Coenzyme Q10 (CO Q 10 PO), Take 1 tablet by mouth daily., Disp: , Rfl:    ezetimibe  (ZETIA ) 10 MG  tablet, Take 1 tablet (10 mg total) by mouth daily., Disp: 90 tablet, Rfl: 3   glucosamine-chondroitin 500-400 MG tablet, Take 2 tablets by mouth daily. , Disp: , Rfl:    nitroGLYCERIN  (NITROSTAT ) 0.4 MG SL tablet, PLACE 1 TABLET UNDER THE TONGUE EVERY 5 (FIVE) MINUTES AS NEEDED FOR CHEST PAIN., Disp: 25 tablet, Rfl: 4   Omega-3 Fatty Acids (FISH OIL) 1200 MG CAPS, Take 1,200 mg by mouth in the morning. , Disp: , Rfl:    PARoxetine  (PAXIL ) 20 MG tablet, TAKE 1/2 TABLET BY MOUTH DAILY IN THE MORNING, Disp: 45  tablet, Rfl: 0   pregabalin  (LYRICA ) 150 MG capsule, Take 1 capsule (150 mg total) by mouth in the morning, at noon, and at bedtime., Disp: 270 capsule, Rfl: 0   rosuvastatin  (CRESTOR ) 20 MG tablet, TAKE 1 TABLET BY MOUTH EVERY DAY, Disp: 90 tablet, Rfl: 3   tirzepatide  (MOUNJARO ) 2.5 MG/0.5ML Pen, Inject 2.5 mg into the skin once a week., Disp: 6 mL, Rfl: 0   tiZANidine  (ZANAFLEX ) 2 MG tablet, TAKE 1 TABLET (2 MG TOTAL) BY MOUTH IN THE MORNING AND AT BEDTIME., Disp: 180 tablet, Rfl: 1   vitamin E  180 MG (400 UNITS) capsule, Take 400 Units by mouth daily., Disp: , Rfl:    Objective:     Vitals:   04/13/24 1344  Pulse: 62  SpO2: 95%  Weight: 185 lb (83.9 kg)  Height: 5' 9 (1.753 m)      Body mass index is 27.32 kg/m.    Physical Exam:    General: Appears well, no acute distress, nontoxic and pleasant Psych: no evidence of anxiety or depression   Right elbow: No deformity, swelling or muscle wasting Normal Carrying angle ROM:0-140, supination and pronation 90 TTP medial epicondyle, pronators NTTP over triceps, ticeps tendon, olecranon, lat epicondyle,   antecubital fossa, biceps tendon, supinator,   Negative tinnels over cubital tunnel No pain with resisted wrist and middle digit extension   pain with resisted wrist flexion   pain with resisted supination   pain with resisted pronation Negative valgus stress Negative varus stress    Electronically signed by:  Odis Mace [facility] 1:56 PM 04/13/24

## 2024-04-13 NOTE — Patient Instructions (Signed)
 Voltaren gel over areas of pain  As needed follow up

## 2024-04-19 ENCOUNTER — Emergency Department
Admission: EM | Admit: 2024-04-19 | Discharge: 2024-04-19 | Disposition: A | Discharge status: Home / Self Care | Attending: Emergency Medicine | Admitting: Emergency Medicine

## 2024-04-19 ENCOUNTER — Other Ambulatory Visit: Payer: Self-pay

## 2024-04-19 DIAGNOSIS — R509 Fever, unspecified: Secondary | ICD-10-CM | POA: Diagnosis present

## 2024-04-19 DIAGNOSIS — E876 Hypokalemia: Secondary | ICD-10-CM | POA: Diagnosis not present

## 2024-04-19 DIAGNOSIS — I2510 Atherosclerotic heart disease of native coronary artery without angina pectoris: Secondary | ICD-10-CM | POA: Diagnosis not present

## 2024-04-19 DIAGNOSIS — E1122 Type 2 diabetes mellitus with diabetic chronic kidney disease: Secondary | ICD-10-CM | POA: Diagnosis not present

## 2024-04-19 DIAGNOSIS — I129 Hypertensive chronic kidney disease with stage 1 through stage 4 chronic kidney disease, or unspecified chronic kidney disease: Secondary | ICD-10-CM | POA: Diagnosis not present

## 2024-04-19 DIAGNOSIS — K529 Noninfective gastroenteritis and colitis, unspecified: Secondary | ICD-10-CM | POA: Insufficient documentation

## 2024-04-19 DIAGNOSIS — D72819 Decreased white blood cell count, unspecified: Secondary | ICD-10-CM | POA: Insufficient documentation

## 2024-04-19 DIAGNOSIS — N189 Chronic kidney disease, unspecified: Secondary | ICD-10-CM | POA: Insufficient documentation

## 2024-04-19 LAB — URINALYSIS, ROUTINE W REFLEX MICROSCOPIC
Bacteria, UA: NONE SEEN
Bilirubin Urine: NEGATIVE
Glucose, UA: NEGATIVE mg/dL
Hgb urine dipstick: NEGATIVE
Ketones, ur: 20 mg/dL — AB
Leukocytes,Ua: NEGATIVE
Nitrite: NEGATIVE
Protein, ur: 100 mg/dL — AB
Specific Gravity, Urine: 1.034 — ABNORMAL HIGH (ref 1.005–1.030)
Squamous Epithelial / HPF: 0 /HPF (ref 0–5)
pH: 5 (ref 5.0–8.0)

## 2024-04-19 LAB — CBC
HCT: 45.8 % (ref 36.0–46.0)
Hemoglobin: 15.3 g/dL — ABNORMAL HIGH (ref 12.0–15.0)
MCH: 28.5 pg (ref 26.0–34.0)
MCHC: 33.4 g/dL (ref 30.0–36.0)
MCV: 85.4 fL (ref 80.0–100.0)
Platelets: 189 K/uL (ref 150–400)
RBC: 5.36 MIL/uL — ABNORMAL HIGH (ref 3.87–5.11)
RDW: 12.3 % (ref 11.5–15.5)
WBC: 3.3 K/uL — ABNORMAL LOW (ref 4.0–10.5)
nRBC: 0 % (ref 0.0–0.2)

## 2024-04-19 LAB — COMPREHENSIVE METABOLIC PANEL WITH GFR
ALT: 42 U/L (ref 0–44)
AST: 45 U/L — ABNORMAL HIGH (ref 15–41)
Albumin: 4 g/dL (ref 3.5–5.0)
Alkaline Phosphatase: 84 U/L (ref 38–126)
Anion gap: 20 — ABNORMAL HIGH (ref 5–15)
BUN: 25 mg/dL — ABNORMAL HIGH (ref 8–23)
CO2: 20 mmol/L — ABNORMAL LOW (ref 22–32)
Calcium: 9.5 mg/dL (ref 8.9–10.3)
Chloride: 100 mmol/L (ref 98–111)
Creatinine, Ser: 0.85 mg/dL (ref 0.44–1.00)
GFR, Estimated: >60 mL/min (ref >60)
Glucose, Bld: 134 mg/dL — ABNORMAL HIGH (ref 70–99)
Potassium: 3.1 mmol/L — ABNORMAL LOW (ref 3.5–5.1)
Sodium: 140 mmol/L (ref 135–145)
Total Bilirubin: 0.9 mg/dL (ref 0.0–1.2)
Total Protein: 7.7 g/dL (ref 6.5–8.1)

## 2024-04-19 LAB — LIPASE, BLOOD: Lipase: 52 U/L — ABNORMAL HIGH (ref 11–51)

## 2024-04-19 LAB — MAGNESIUM: Magnesium: 1.8 mg/dL (ref 1.7–2.4)

## 2024-04-19 MED ORDER — LACTATED RINGERS IV BOLUS
1000.0000 mL | Freq: Once | INTRAVENOUS | Status: AC
  Administered 2024-04-19: 1000 mL via INTRAVENOUS

## 2024-04-19 MED ORDER — KETOROLAC TROMETHAMINE 30 MG/ML IJ SOLN
15.0000 mg | Freq: Once | INTRAMUSCULAR | Status: AC
  Administered 2024-04-19: 15 mg via INTRAVENOUS
  Filled 2024-04-19: qty 1

## 2024-04-19 MED ORDER — ONDANSETRON HCL 4 MG/2ML IJ SOLN
4.0000 mg | Freq: Once | INTRAMUSCULAR | Status: AC
  Administered 2024-04-19: 4 mg via INTRAVENOUS
  Filled 2024-04-19: qty 2

## 2024-04-19 MED ORDER — POTASSIUM CHLORIDE CRYS ER 20 MEQ PO TBCR
40.0000 meq | EXTENDED_RELEASE_TABLET | Freq: Once | ORAL | Status: AC
  Administered 2024-04-19: 40 meq via ORAL
  Filled 2024-04-19: qty 2

## 2024-04-19 MED ORDER — LOPERAMIDE HCL 2 MG PO CAPS
2.0000 mg | ORAL_CAPSULE | Freq: Once | ORAL | Status: AC
  Administered 2024-04-19: 2 mg via ORAL
  Filled 2024-04-19: qty 1

## 2024-04-19 MED ORDER — ONDANSETRON 4 MG PO TBDP: 4.0000 mg | ORAL_TABLET | Freq: Three times a day (TID) | ORAL | 0 refills | Status: AC | PRN

## 2024-04-19 MED ORDER — LOPERAMIDE HCL 2 MG PO CAPS: 2.0000 mg | ORAL_CAPSULE | ORAL | 0 refills | Status: AC | PRN

## 2024-04-19 NOTE — ED Provider Notes (Signed)
-----------------------------------------   4:22 PM on 04/19/2024 -----------------------------------------  I took over care on this patient from Dr. Willo.  She has received a second liter of fluids.  On reassessment she is tolerating p.o. and states that she is feeling somewhat better.  She feels comfortable going home.  She is stable for discharge at this time.  I counseled her on return precautions, and she expressed understanding.   Jacolyn Pae, MD 04/19/24 1622

## 2024-04-19 NOTE — ED Triage Notes (Signed)
 Pt to ED via POV from home. Pt reports N/V/D and fever of 100.5 yesterday. Pt reports symptoms started Thursday. Pt reports mid/lower abd pain.

## 2024-04-19 NOTE — ED Notes (Signed)
 Pt states she is unable to tolerate PO at this time. Potassium pills held.

## 2024-04-19 NOTE — ED Provider Notes (Signed)
 [facility] Provider Note    Event Date/Time   First MD Initiated Contact with Patient 04/19/24 1004     (approximate)   History   Chief Complaint Abdominal Pain   HPI  Melinda [name] is a 68 y.o. female with past medical history of diabetes, CAD, CKD, and chronic pain syndrome who presents to the ED complaining of abdominal pain.  Patient reports that she has had 3 days of persistent watery diarrhea with crampy pain in her abdomen.  On the first day she had a fever but fevers have since resolved and she denies any blood in her stool or travel outside of the country.  She has been feeling nauseous throughout, had some vomiting on the first day of symptoms but no vomiting since then.  She does report very poor appetite with minimal oral intake over the past 2 days.  She denies any sick contacts and has not eaten anything different than her usual.     Physical Exam   Triage Vital Signs: ED Triage Vitals  Encounter Vitals Group     BP 04/19/24 0801 (!) 163/109     Girls Systolic BP Percentile --      Girls Diastolic BP Percentile --      Boys Systolic BP Percentile --      Boys Diastolic BP Percentile --      Pulse Rate 04/19/24 0801 91     Resp 04/19/24 0801 18     Temp 04/19/24 0801 98.4 F (36.9 C)     Temp Source 04/19/24 0801 Oral     SpO2 04/19/24 0801 100 %     Weight --      Height --      Head Circumference --      Peak Flow --      Pain Score 04/19/24 0802 8     Pain Loc --      Pain Education --      Exclude from Growth Chart --     Most recent vital signs: Vitals:   04/19/24 1030 04/19/24 1100  BP: (!) 170/90 (!) 161/85  Pulse: 74 73  Resp: 20   Temp:    SpO2: 100% 94%    Constitutional: Alert and oriented. Eyes: Conjunctivae are normal. Head: Atraumatic. Nose: No congestion/rhinnorhea. Mouth/Throat: Mucous membranes are dry. Cardiovascular: Normal rate, regular rhythm. Grossly normal heart sounds.  2+ radial pulses  bilaterally. Respiratory: Normal respiratory effort.  No retractions. Lungs CTAB. Gastrointestinal: Soft and nontender. No distention. Musculoskeletal: No lower extremity tenderness nor edema.  Neurologic:  Normal speech and language. No gross focal neurologic deficits are appreciated.    ED Results / Procedures / Treatments   Labs (all labs ordered are listed, but only abnormal results are displayed) Labs Reviewed  LIPASE, BLOOD - Abnormal; Notable for the following components:      Result Value   Lipase 52 (*)    All other components within normal limits  COMPREHENSIVE METABOLIC PANEL WITH GFR - Abnormal; Notable for the following components:   Potassium 3.1 (*)    CO2 20 (*)    Glucose, Bld 134 (*)    BUN 25 (*)    AST 45 (*)    Anion gap 20 (*)    All other components within normal limits  CBC - Abnormal; Notable for the following components:   WBC 3.3 (*)    RBC 5.36 (*)    Hemoglobin 15.3 (*)    All other components  within normal limits  URINALYSIS, ROUTINE W REFLEX MICROSCOPIC - Abnormal; Notable for the following components:   Color, Urine YELLOW (*)    APPearance CLEAR (*)    Specific Gravity, Urine 1.034 (*)    Ketones, ur 20 (*)    Protein, ur 100 (*)    All other components within normal limits  GASTROINTESTINAL PANEL BY PCR, STOOL (REPLACES STOOL CULTURE)  C DIFFICILE QUICK SCREEN W PCR REFLEX    MAGNESIUM    PROCEDURES:  Critical Care performed: No  Procedures   MEDICATIONS ORDERED IN ED: Medications  potassium chloride  SA (KLOR-CON  M) CR tablet 40 mEq (has no administration in time range)  loperamide (IMODIUM) capsule 2 mg (has no administration in time range)  ketorolac  (TORADOL ) 30 MG/ML injection 15 mg (has no administration in time range)  ondansetron  (ZOFRAN ) injection 4 mg (4 mg Intravenous Given 04/19/24 1035)  lactated ringers  bolus 1,000 mL (1,000 mLs Intravenous New Bag/Given 04/19/24 1035)  lactated ringers  bolus 1,000 mL (1,000 mLs  Intravenous New Bag/Given 04/19/24 1308)     IMPRESSION / MDM / ASSESSMENT AND PLAN / ED COURSE  I reviewed the triage vital signs and the nursing notes.                              68 y.o. female with past medical history of diabetes, CAD, CKD, and chronic pain syndrome who presents to the ED complaining of 3 days of crampy abdominal pain with diarrhea.  Patient's presentation is most consistent with acute presentation with potential threat to life or bodily function.  Differential diagnosis includes, but is not limited to, gastroenteritis, dehydration, electrolyte abnormality, AKI, appendicitis, diverticulitis, UTI, C. Difficile.  Patient nontoxic-appearing and in no acute distress, vital signs remarkable for hypertension but otherwise reassuring.  She has a benign abdominal exam and symptoms seem most consistent with a viral gastroenteritis, we will hold off on CT imaging.  Labs with mild leukopenia but no significant anemia or AKI.  She does have mild hypokalemia, will add on magnesium level.  Also noted to have elevated BUN to creatinine ratio consistent with dehydration, will give IV fluid bolus and treat with IV Zofran .  Stool studies are pending, but overall low suspicion for C. difficile given no recent antibiotics.  Patient turned over to oncoming divider pending further symptomatic management with IV Toradol  and loperamide, after which we will do p.o. challenge.  If patient able to tolerate oral intake, she would be appropriate for discharge home with outpatient follow-up.      FINAL CLINICAL IMPRESSION(S) / ED DIAGNOSES   Final diagnoses:  Gastroenteritis     Rx / DC Orders   ED Discharge Orders          Ordered    loperamide (IMODIUM) 2 MG capsule  As needed        04/19/24 1451    ondansetron  (ZOFRAN -ODT) 4 MG disintegrating tablet  Every 8 hours PRN        04/19/24 1451             Note:  This document was prepared using Dragon voice recognition software  and may include unintentional dictation errors.   Willo Dunnings, MD 04/19/24 [phone]

## 2024-04-25 ENCOUNTER — Other Ambulatory Visit: Payer: Self-pay | Admitting: Internal Medicine

## 2024-04-27 NOTE — Telephone Encounter (Signed)
 Requested Prescriptions  Pending Prescriptions Disp Refills   buPROPion  (WELLBUTRIN  XL) 150 MG 24 hr tablet [Pharmacy Med Name: BUPROPION  HCL XL 150 MG TABLET] 90 tablet 0    Sig: TAKE 1 TABLET BY MOUTH EVERY DAY     Psychiatry: Antidepressants - bupropion  Failed - 04/27/2024  4:04 PM      Failed - AST in normal range and within 360 days    AST  Date Value Ref Range Status  04/19/2024 45 (H) 15 - 41 U/L Final         Failed - Last BP in normal range    BP Readings from Last 1 Encounters:  04/19/24 (!) 145/70         Passed - Cr in normal range and within 360 days    Creat  Date Value Ref Range Status  02/20/2024 1.70 (H) 0.50 - 1.05 mg/dL Final   Creatinine, Ser  Date Value Ref Range Status  04/19/2024 0.85 0.44 - 1.00 mg/dL Final   Creatinine, Urine  Date Value Ref Range Status  02/20/2024 201 20 - 275 mg/dL Final         Passed - ALT in normal range and within 360 days    ALT  Date Value Ref Range Status  04/19/2024 42 0 - 44 U/L Final         Passed - Completed PHQ-2 or PHQ-9 in the last 360 days      Passed - Valid encounter within last 6 months    Recent Outpatient Visits           2 months ago Encounter for general adult medical examination with abnormal findings   [place] [facility] [place], [place] W, NP   7 months ago Type 2 diabetes mellitus with diabetic mononeuropathy, without long-term current use of insulin  [facility])   [place] [facility] [place], [place] W, NP               PARoxetine  (PAXIL ) 20 MG tablet [Pharmacy Med Name: PAROXETINE  HCL 20 MG TABLET] 45 tablet 0    Sig: TAKE 1/2 TABLET BY MOUTH DAILY IN THE MORNING     Psychiatry:  Antidepressants - SSRI Passed - 04/27/2024  4:04 PM      Passed - Completed PHQ-2 or PHQ-9 in the last 360 days      Passed - Valid encounter within last 6 months    Recent Outpatient Visits           2 months ago Encounter for general adult medical examination with abnormal  findings   [place] [facility] [place], [place] W, NP   7 months ago Type 2 diabetes mellitus with diabetic mononeuropathy, without long-term current use of insulin  [facility])    [facility] [place], [place], [place]

## 2024-04-30 ENCOUNTER — Other Ambulatory Visit: Payer: Self-pay | Admitting: Internal Medicine

## 2024-04-30 DIAGNOSIS — M797 Fibromyalgia: Secondary | ICD-10-CM

## 2024-05-01 NOTE — Telephone Encounter (Signed)
 Requested medications are due for refill today.  yes  Requested medications are on the active medications list.  yes  Last refill. 01/23/2024 #270 0 rf  Future visit scheduled.   yes  Notes to clinic.  Refill not delegated.    Requested Prescriptions  Pending Prescriptions Disp Refills   pregabalin  (LYRICA ) 150 MG capsule [Pharmacy Med Name: PREGABALIN  150 MG CAPSULE] 270 capsule 0    Sig: Take 1 capsule (150 mg total) by mouth in the morning, at noon, and at bedtime.     Not Delegated - Neurology:  Anticonvulsants - Controlled - pregabalin  Failed - 05/01/2024  4:20 PM      Failed - This refill cannot be delegated      Passed - Cr in normal range and within 360 days    Creat  Date Value Ref Range Status  02/20/2024 1.70 (H) 0.50 - 1.05 mg/dL Final   Creatinine, Ser  Date Value Ref Range Status  04/19/2024 0.85 0.44 - 1.00 mg/dL Final   Creatinine, Urine  Date Value Ref Range Status  02/20/2024 201 20 - 275 mg/dL Final         Passed - Completed PHQ-2 or PHQ-9 in the last 360 days      Passed - Valid encounter within last 12 months    Recent Outpatient Visits           2 months ago Encounter for general adult medical examination with abnormal findings   [place] [facility] [place], [place] W, NP   7 months ago Type 2 diabetes mellitus with diabetic mononeuropathy, without long-term current use of insulin  [employer] [facility])   [facility] [place], [place], [place]

## 2024-05-13 ENCOUNTER — Ambulatory Visit (INDEPENDENT_AMBULATORY_CARE_PROVIDER_SITE_OTHER): Admitting: Primary Care

## 2024-05-13 ENCOUNTER — Encounter: Payer: Self-pay | Admitting: Primary Care

## 2024-05-13 VITALS — BP 126/64 | HR 80 | Temp 97.6°F | Ht 69.0 in | Wt 167.6 lb

## 2024-05-13 DIAGNOSIS — G4733 Obstructive sleep apnea (adult) (pediatric): Secondary | ICD-10-CM | POA: Diagnosis not present

## 2024-05-13 DIAGNOSIS — A084 Viral intestinal infection, unspecified: Secondary | ICD-10-CM | POA: Diagnosis not present

## 2024-05-13 DIAGNOSIS — R052 Subacute cough: Secondary | ICD-10-CM

## 2024-05-13 DIAGNOSIS — R053 Chronic cough: Secondary | ICD-10-CM

## 2024-05-13 DIAGNOSIS — J302 Other seasonal allergic rhinitis: Secondary | ICD-10-CM | POA: Diagnosis not present

## 2024-05-13 MED ORDER — AZELASTINE HCL 0.1 % NA SOLN: 1.0000 | Freq: Two times a day (BID) | NASAL | 1 refills | Status: DC

## 2024-05-13 NOTE — Progress Notes (Signed)
 @Patient  ID: Melinda [name], female    DOB: 06-30-1956, 68 y.o.   MRN: [id number]  No chief complaint on file.   Referring provider: Antonette Angeline ORN, NP  HPI: 68 year old female, former smoker.  Past medical history significant for obstructive sleep apnea, daytime sleepiness. Former Dr. Verdia, last seen in November 2019. Maintained on auto CPAP 9-20cm h20. She has been on Provigil and Nuvigil in the past which have subsequently been weaned off. Chronic pain managed by pain clinic.   Previous [facility] pulmonary encounter:  02/03/2020 Patient presents today for follow-up. She has not been seen in over 2.5 years. She had her own CPAP machine which stopped working. She has been without machine for 2 weeks. [employer] is sending her a new CPAP. She wears full nasal mask. Normal bedtime varies. She generally wakes up between 8-8:30am. She will sometimes fall asleep in the chair. She does some pursed lip breathing at night, she has had a chin strap with her mask but this didn't particularly help. She sleeps well. She sleeps on her back. CPAP does help her sleep. She quit smoking.   03/25/2020 Patient contacted today for 6-8 week follow-up. Doing well, sleeping well and compliant with CPAP use. She uses nasal mask and has tried chin strap but still experiences airleak. States that regardless of what she does she still opens her mouth which wakes up her partner. She would like to try a full face mask. DME company is [employer].   Airview download 02/20/20-03/20/20: Usage 25/30 (83%)  Pressure 9-20cm h20 (12.2- 95%) Airleak 17.5L/min (95%) AHI 3.1  06/18/2022 Patient presents today for overdue follow-up OSA. CPAP pressure was lowered to 9-16cm h20 back in January 2022. She reports 100% compliance with CPAP use. Snoring improved. She feels pressure is still too high. She has lost weight. Wife noticing more airleaks, she changed pressure herself to 6-12cm h20. She is no longer waking her significant other up. She is  sleeping well. The only time she does not use CPAP is when she falls asleep in recliner. When this occurs she will still snore. She is no longer on pain medications for chronic pain, using CBD.   Airview download 05/19/22-06/17/22 30/30 days (100%); 27 days (90%) > 4 hours Pressure 6-12cm h20 (10.5cm h20-95%) Airleaks 16L/min (95%) AHI 2.6/hour   05/13/2024- Interim hx  Discussed the use of AI scribe software for clinical note transcription with the patient, who gave verbal consent to proceed.  History of Present Illness Melinda [name] is a 68 year old female who presents with a persistent cough and recent history of viral gastroenteritis.  Three weeks ago, she experienced severe symptoms of vomiting and diarrhea, leading to an emergency room visit on October 19th. She was diagnosed with viral gastroenteritis, received fluids and Zofran , and was noted to have low potassium levels. No stool sample was taken. Her symptoms included nausea and significant weakness, which resolved after a week, and she regained her appetite.  She has a persistent cough that has been ongoing for months, particularly in the morning and late at night. The cough is triggered by sinus drainage and a tickling sensation in her throat, leading to coughing fits. She has not been taking any medication for postnasal drip. No mucus production with cough or fevers since the initial illness.  She was previously on Mounjaro  for weight management due to elevated A1c levels, which stabilized in the fives. She lost significant weight, dropping from 230 to 155 pounds. She stopped the medication a few  months ago on her primary care provider's advice after her A1c stabilized. Since discontinuing, she reports increased appetite.  She uses a CPAP machine consistently, with a usage rate of 97% and an average of 7 hours and 37 minutes per night. Her apnea score is 3.1. She has experienced issues with air pressure causing her lips to open  during sleep, despite attempts with a chin strap and full face mask. She suspects sinus congestion may contribute to this issue.      No Known Allergies  Immunization History  Administered Date(s) Administered    sv, Bivalent, Protein Subunit Rsvpref,pf (Abrysvo) 06/05/2023   INFLUENZA, HIGH DOSE SEASONAL PF 05/08/2024   Influenza,inj,Quad PF,6+ Mos 07/28/2015, 05/18/2016, 08/06/2017, 02/13/2019, 02/20/2020, 04/06/2022   Influenza-Unspecified 04/15/1998, 05/19/2014, 04/10/2021   PFIZER(Purple Top)SARS-COV-2 Vaccination 09/19/2019, 10/13/2019, 07/03/2020, 12/21/2020   PNEUMOCOCCAL CONJUGATE-20 02/18/2023, 06/05/2023   Pfizer Covid-19 Vaccine Bivalent Booster 45yrs & up 04/10/2021   Pfizer(Comirnaty)Fall Seasonal Vaccine 12 years and older 05/08/2024   Pneumococcal Polysaccharide-23 07/28/2015, 06/22/2021   Rabies, IM 07/09/2019, 07/12/2019, 07/16/2019, 07/23/2019   Td 07/03/1991   Td (Adult),unspecified 07/03/1991   Tdap 08/03/2007, 07/09/2019   Unspecified SARS-COV-2 Vaccination 04/06/2022   Zoster Recombinant(Shingrix) 03/12/2019    Past Medical History:  Diagnosis Date   Abdominal wall seroma    Amputation of great toe, right, 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 b.) Doppler 06/28/2015: 40-59% BILATERAL ICA. c.) Dopplers 04/23/2019, 04/22/2020, 10/20/2021: 40-59% RICA and 1-39% LICA.   Blue toes    2nd toe on right foot, will get appt.   Bulging disc    CAD (coronary artery disease) 2009   a.) PCI with placement of a 15 x 30 mm Promus DES to mLAD. b.) LHC 02/27/2011: EF 60%; no sig CAD; mild lum irreg at ostium of diagnoal (jailed by stent).   Chicken pox    Chronic fatigue    Chronic, continuous use of opioids    CKD (chronic kidney disease), stage III (HCC)    COVID  02/22/2022   Degenerative disc disease    Depression    Diverticulosis    Facet joint disease    Failed cervical fusion    Fibromyalgia    Hiatal hernia    Hyperlipidemia    IBS (irritable bowel syndrome)    MCL deficiency, knee    MRSA (methicillin resistant staph aureus) culture positive 2011   GREAT TOE RIGHT FOOT   Neuropathy 04/18/2010   Orthostatic hypotension 01/2020   a.) multiple pre-syncopal episodes; takes proamatine    OSA on CPAP 08/17/2003   Osteoarthritis    a.) back, neck, hands, knees   Restless leg syndrome    Sepsis (HCC) 09/2013   Sleep apnea 08/17/2003   Spinal headache 11/07/2021   Spinal stenosis    T2DM (type 2 diabetes mellitus) (HCC)    Wolff-Parkinson-White (WPW) syndrome 1992   a.) s/p ablations; unsuccessful. Now has rare episodes    Tobacco History: Social History   Tobacco Use  Smoking Status Former   Current packs/day: 0.00   Average packs/day: 1.5 packs/day for 32.0 years (48.0 ttl pk-yrs)   Types: Cigarettes   Start date: 07/22/1975   Quit date: 07/22/2007   Years since quitting: 16.8  Smokeless Tobacco Former   Counseling given: Not Answered   Outpatient  Medications Prior to Visit  Medication Sig Dispense Refill   ACCU-CHEK GUIDE test strip USE 1 EACH BY OTHER ROUTE 3 (THREE) TIMES DAILY. USE AS INSTRUCTE 200 strip 1   Accu-Chek Softclix Lancets lancets USE TO CHECK BLOOD SUGAR 1 TIME DAILY 100 each 2   Ascorbic Acid (VITAMIN C) 1000 MG tablet Take 1,000 mg by mouth daily.     ASPIRIN  81 PO Take 81 mg by mouth daily.     Biotin 89999 MCG TABS Take 10,000 mcg by mouth daily.     Blood Glucose Calibration (ACCU-CHEK GUIDE CONTROL) LIQD 1 each by In Vitro route as needed. 1 each 1   buPROPion  (WELLBUTRIN  XL) 150 MG 24 hr tablet TAKE 1 TABLET BY MOUTH EVERY DAY 90 tablet 0   CALCIUM -MAGNESIUM-ZINC  PO Take 3 capsules by mouth daily.     CINNAMON PO Take 1,000 mg by mouth daily.     Coenzyme Q10 (CO Q 10 PO) Take 1 tablet by mouth  daily.     ezetimibe  (ZETIA ) 10 MG tablet Take 1 tablet (10 mg total) by mouth daily. 90 tablet 3   glucosamine-chondroitin 500-400 MG tablet Take 2 tablets by mouth daily.      loperamide (IMODIUM) 2 MG capsule Take 1 capsule (2 mg total) by mouth as needed for diarrhea or loose stools. 30 capsule 0   nitroGLYCERIN  (NITROSTAT ) 0.4 MG SL tablet PLACE 1 TABLET UNDER THE TONGUE EVERY 5 (FIVE) MINUTES AS NEEDED FOR CHEST PAIN. 25 tablet 4   Omega-3 Fatty Acids (FISH OIL) 1200 MG CAPS Take 1,200 mg by mouth in the morning.      ondansetron  (ZOFRAN -ODT) 4 MG disintegrating tablet Take 1 tablet (4 mg total) by mouth every 8 (eight) hours as needed for nausea or vomiting. 12 tablet 0   PARoxetine  (PAXIL ) 20 MG tablet TAKE 1/2 TABLET BY MOUTH DAILY IN THE MORNING 45 tablet 0   pregabalin  (LYRICA ) 150 MG capsule TAKE 1 CAPSULE (150 MG TOTAL) BY MOUTH IN THE MORNING, AT NOON, AND AT BEDTIME. 270 capsule 0   rosuvastatin  (CRESTOR ) 20 MG tablet TAKE 1 TABLET BY MOUTH EVERY DAY 90 tablet 3   tirzepatide  (MOUNJARO ) 2.5 MG/0.5ML Pen Inject 2.5 mg into the skin once a week. 6 mL 0   tiZANidine  (ZANAFLEX ) 2 MG tablet TAKE 1 TABLET (2 MG TOTAL) BY MOUTH IN THE MORNING AND AT BEDTIME. 180 tablet 1   vitamin E  180 MG (400 UNITS) capsule Take 400 Units by mouth daily.     No facility-administered medications prior to visit.      Review of Systems  Review of Systems  Constitutional: Negative.   HENT:  Positive for postnasal drip.   Respiratory:  Positive for cough. Negative for chest tightness, shortness of breath and wheezing.   Cardiovascular: Negative.    Physical Exam  LMP  (LMP Unknown)  Physical Exam Constitutional:      Appearance: Normal appearance. She is well-developed.  HENT:     Head: Normocephalic and atraumatic.     Mouth/Throat:     Mouth: Mucous membranes are moist.     Pharynx: Oropharynx is clear.  Eyes:     Pupils: Pupils are equal, round, and reactive to light.  Cardiovascular:      Rate and Rhythm: Normal rate and regular rhythm.     Heart sounds: Normal heart sounds. No murmur heard. Pulmonary:     Effort: Pulmonary effort is normal. No respiratory distress.     Breath sounds: Normal  breath sounds. No wheezing or rhonchi.     Comments: CTA Musculoskeletal:        General: Normal range of motion.     Cervical back: Normal range of motion and neck supple.  Skin:    General: Skin is warm and dry.     Findings: No erythema or rash.  Neurological:     General: No focal deficit present.     Mental Status: She is alert and oriented to person, place, and time. Mental status is at baseline.  Psychiatric:        Mood and Affect: Mood normal.        Behavior: Behavior normal.        Thought Content: Thought content normal.        Judgment: Judgment normal.      Lab Results:  CBC    Component Value Date/Time   WBC 3.3 (L) 04/19/2024 0803   RBC 5.36 (H) 04/19/2024 0803   HGB 15.3 (H) 04/19/2024 0803   HGB 14.0 11/12/2011 1136   HCT 45.8 04/19/2024 0803   HCT 40.7 11/12/2011 1136   PLT 189 04/19/2024 0803   PLT 274 11/12/2011 1136   MCV 85.4 04/19/2024 0803   MCV 86 11/12/2011 1136   MCH 28.5 04/19/2024 0803   MCHC 33.4 04/19/2024 0803   RDW 12.3 04/19/2024 0803   RDW 12.8 11/12/2011 1136   LYMPHSABS 1.4 09/06/2020 1330   LYMPHSABS 2.5 11/12/2011 1136   MONOABS 0.5 09/06/2020 1330   MONOABS 0.8 11/12/2011 1136   EOSABS 0.2 09/06/2020 1330   EOSABS 0.6 11/12/2011 1136   BASOSABS 0.1 09/06/2020 1330   BASOSABS 0.1 11/12/2011 1136    BMET    Component Value Date/Time   NA 140 04/19/2024 0803   NA 139 10/23/2021 1552   NA 135 (L) 06/30/2013 1203   K 3.1 (L) 04/19/2024 0803   K 4.1 06/30/2013 1203   CL 100 04/19/2024 0803   CL 102 06/30/2013 1203   CO2 20 (L) 04/19/2024 0803   CO2 32 06/30/2013 1203   GLUCOSE 134 (H) 04/19/2024 0803   GLUCOSE 143 (H) 06/30/2013 1203   BUN 25 (H) 04/19/2024 0803   BUN 21 10/23/2021 1552   BUN 19 (H)  06/30/2013 1203   CREATININE 0.85 04/19/2024 0803   CREATININE 1.70 (H) 02/20/2024 1420   CALCIUM  9.5 04/19/2024 0803   CALCIUM  9.7 06/30/2013 1203   GFRNONAA >60 04/19/2024 0803   GFRNONAA 56 (L) 12/21/2020 0957   GFRAA 65 12/21/2020 0957    BNP    Component Value Date/Time   BNP 35 04/03/2019 1522    ProBNP No results found for: PROBNP  Imaging: No results found.   Assessment & Plan:    1. Obstructive sleep apnea syndrome (Primary) - Ambulatory Referral for DME  2. Viral gastroenteritis - CBC with Differential/Platelet - Comprehensive metabolic panel with GFR  3. Seasonal allergic rhinitis, unspecified trigger  4. Subacute cough   Assessment and Plan Assessment & Plan Chronic cough and postnasal drip Chronic cough, especially in the morning and late at night, likely due to postnasal drip. No fever or productive cough. Lungs are clear on examination. - Recommended over-the-counter Allegra or Claritin and will prescribe Astelin nasal spray twice daily for postnasal drip. - Advised use of OTC Delsym cough syrup twice daily for cough suppression. - If cough persists, will consider ordering a chest x-ray.  Obstructive sleep apnea, well controlled Obstructive sleep apnea is well controlled with CPAP therapy. Current  pressure 5-8cm h20 with residual apnea-hypopnea index (AHI) is 3.1/hour. Pressure settings have been adjusted to address issues with air leakage through the lips, but the condition remains well managed. - Continue current CPAP therapy nightly  - Consider using a full face mask to address air leakage. - Explore side sleeper pillows to improve comfort with CPAP use. - Renew CPAP supplies x 1 year   History of overweight/obesity with prior GLP-1 agonist use and weight loss Significant weight loss from 230 lbs to 155 lbs with GLP-1 agonist (Mounjaro ) use. Medication was discontinued due to stabilized A1c levels and insurance coverage concerns. Risk of  weight regain and increased A1c if medication is not continued. - Discuss with primary care provider about restarting GLP-1 agonist therapy or adjusting dosing schedule.  Viral gastroenteritis Patient seen in ED in October for gastroenteritis, symptoms for the most part have improved - Check CMET and CBC     Melinda [name] LELON Ferrari, NP 05/13/2024

## 2024-05-13 NOTE — Patient Instructions (Signed)
  VISIT SUMMARY: During your visit, we discussed your persistent cough, recent history of viral gastroenteritis, and your well-controlled obstructive sleep apnea. We also reviewed your significant weight loss and the discontinuation of your weight management medication.  YOUR PLAN: -CHRONIC COUGH AND POSTNASAL DRIP: Your chronic cough, especially in the morning and late at night, is likely due to postnasal drip. This means that mucus from your sinuses is draining into your throat, causing irritation and coughing. We recommend taking over-the-counter Allegra or Claritin for the postnasal drip, and we have prescribed a nasal spray to help with this. Additionally, you can use Delsym cough syrup to suppress the cough. If the cough persists, we may need to order a chest x-ray.  -OBSTRUCTIVE SLEEP APNEA, WELL CONTROLLED: Your obstructive sleep apnea is well controlled with your CPAP therapy. This condition causes your breathing to stop and start during sleep, but your current treatment is effective. Continue using your CPAP machine with the adjusted pressure settings. To address air leakage through your lips, consider using a full face mask or mouth tape, and explore side sleeper pillows for added comfort.  -HISTORY OF OVERWEIGHT/OBESITY WITH PRIOR GLP-1 AGONIST USE AND WEIGHT LOSS: You have experienced significant weight loss from 230 lbs to 155 lbs with the use of the medication Mounjaro , which was discontinued after your A1c levels stabilized. This medication helps manage weight and blood sugar levels. We recommend discussing with your primary care provider about possibly restarting the medication or adjusting the dosing schedule.  INSTRUCTIONS: Please follow up with your primary care provider to discuss the possibility of restarting your weight management medication. If your cough persists despite the recommended treatments, contact our office to consider further evaluation, including a chest  x-ray.  Orders: Labs today Renew CPAP SUPPLIES   Follow-up 6 months with Beth NP or sooner if needed

## 2024-05-14 LAB — COMPREHENSIVE METABOLIC PANEL WITH GFR
ALT: 14 U/L (ref 0–35)
AST: 19 U/L (ref 0–37)
Albumin: 4.3 g/dL (ref 3.5–5.2)
Alkaline Phosphatase: 82 U/L (ref 39–117)
BUN: 18 mg/dL (ref 6–23)
CO2: 30 meq/L (ref 19–32)
Calcium: 9.1 mg/dL (ref 8.4–10.5)
Chloride: 102 meq/L (ref 96–112)
Creatinine, Ser: 1 mg/dL (ref 0.40–1.20)
GFR: 58.64 mL/min — ABNORMAL LOW (ref >60.00)
Glucose, Bld: 73 mg/dL (ref 70–99)
Potassium: 4.4 meq/L (ref 3.5–5.1)
Sodium: 140 meq/L (ref 135–145)
Total Bilirubin: 0.4 mg/dL (ref 0.2–1.2)
Total Protein: 7.2 g/dL (ref 6.0–8.3)

## 2024-05-14 LAB — CBC WITH DIFFERENTIAL/PLATELET
Basophils Absolute: 0.1 K/uL (ref 0.0–0.1)
Basophils Relative: 1.3 % (ref 0.0–3.0)
Eosinophils Absolute: 0.2 K/uL (ref 0.0–0.7)
Eosinophils Relative: 4.1 % (ref 0.0–5.0)
HCT: 38.4 % (ref 36.0–46.0)
Hemoglobin: 13 g/dL (ref 12.0–15.0)
Lymphocytes Relative: 29.2 % (ref 12.0–46.0)
Lymphs Abs: 1.6 K/uL (ref 0.7–4.0)
MCHC: 34 g/dL (ref 30.0–36.0)
MCV: 86.8 fl (ref 78.0–100.0)
Monocytes Absolute: 0.5 K/uL (ref 0.1–1.0)
Monocytes Relative: 8.3 % (ref 3.0–12.0)
Neutro Abs: 3.2 K/uL (ref 1.4–7.7)
Neutrophils Relative %: 57.1 % (ref 43.0–77.0)
Platelets: 226 K/uL (ref 150.0–400.0)
RBC: 4.42 Mil/uL (ref 3.87–5.11)
RDW: 13.1 % (ref 11.5–15.5)
WBC: 5.5 K/uL (ref 4.0–10.5)

## 2024-06-01 ENCOUNTER — Ambulatory Visit (INDEPENDENT_AMBULATORY_CARE_PROVIDER_SITE_OTHER): Admitting: Internal Medicine

## 2024-06-01 ENCOUNTER — Encounter: Payer: Self-pay | Admitting: Internal Medicine

## 2024-06-01 VITALS — BP 134/78 | Ht 69.0 in | Wt 168.2 lb

## 2024-06-01 DIAGNOSIS — A084 Viral intestinal infection, unspecified: Secondary | ICD-10-CM | POA: Diagnosis not present

## 2024-06-01 DIAGNOSIS — Z1231 Encounter for screening mammogram for malignant neoplasm of breast: Secondary | ICD-10-CM

## 2024-06-01 DIAGNOSIS — R55 Syncope and collapse: Secondary | ICD-10-CM | POA: Diagnosis not present

## 2024-06-01 DIAGNOSIS — N1831 Chronic kidney disease, stage 3a: Secondary | ICD-10-CM

## 2024-06-01 DIAGNOSIS — M25562 Pain in left knee: Secondary | ICD-10-CM

## 2024-06-01 NOTE — Patient Instructions (Signed)
Syncope, Adult  Syncope is when you pass out or faint for a short time. It is caused by a sudden decrease in blood flow to the brain. This can happen for many reasons. It can sometimes happen when seeing blood, getting a shot (injection), or having pain or strong emotions. Most causes of fainting are not dangerous, but in some cases it can be a sign of a serious medical problem. If you faint, get help right away. Call your local emergency services (911 in the [place] Follow these instructions at home: Watch for any changes in your symptoms. Take these actions to stay safe and help with your symptoms: Knowing when you may be about to faint Signs that you may be about to faint include: Feeling dizzy or light-headed. It may feel like the room is spinning. Feeling weak. Feeling like you may vomit (nauseous). Seeing spots or seeing all white or all black. Having cold, clammy skin. Feeling warm and sweaty. Hearing ringing in the ears. If you start to feel like you might faint, sit or lie down right away. If sitting, lower your head down between your legs. If lying down, raise (elevate) your feet above the level of your heart. Breathe deeply and steadily. Wait until all of the symptoms are gone. Have someone stay with you until you feel better. Medicines Take over-the-counter and prescription medicines only as told by your doctor. If you are taking blood pressure or heart medicine, sit up and stand up slowly. Spend a few minutes getting ready to sit and then stand. This can help you feel less dizzy. Lifestyle Do not drive, use machinery, or play sports until your doctor says it is okay. Do not drink alcohol. Do not smoke or use any products that contain nicotine or tobacco. If you need help quitting, ask your doctor. Avoid hot tubs and saunas. General instructions Talk with your doctor about your symptoms. You may need to have testing to help find the cause. Drink enough fluid to keep your pee  (urine) pale yellow. Avoid standing for a long time. If you must stand for a long time, do movements such as: Moving your legs. Crossing your legs. Flexing and stretching your leg muscles. Squatting. Keep all follow-up visits. Contact a doctor if: You have episodes of near fainting. Get help right away if: You pass out or faint. You hit your head or are injured after fainting. You have any of these symptoms: Fast or uneven heartbeats (palpitations). Pain in your chest, belly, or back. Shortness of breath. You have jerky movements that you cannot control (seizure). You have a very bad headache. You are confused. You have problems with how you see (vision). You are very weak. You have trouble walking. You are bleeding from your mouth or your butt (rectum). You have black or tarry poop (stool). These symptoms may be an emergency. Get help right away. Call your local emergency services (911 in the [place] Do not wait to see if the symptoms will go away. Do not drive yourself to the hospital. Summary Syncope is when you pass out or faint for a short time. It is caused by a sudden decrease in blood flow to the brain. Signs that you may be about to faint include feeling dizzy or light-headed, feeling like you may vomit, seeing all white or all black, or having cold, clammy skin. If you start to feel like you might faint, sit or lie down right away. Lower your head if sitting, or raise (elevate)   your feet if lying down. Breathe deeply and steadily. Wait until all of the symptoms are gone. This information is not intended to replace advice given to you by your health care provider. Make sure you discuss any questions you have with your health care provider. Document Revised: 10/27/2020 Document Reviewed: 10/27/2020 Elsevier Patient Education  2024 [employer].

## 2024-06-01 NOTE — Progress Notes (Signed)
 Subjective:    Patient ID: Melinda [name], female    DOB: September 08, 1955, 67 y.o.   MRN: [id number]  HPI  Discussed the use of AI scribe software for clinical note transcription with the patient, who gave verbal consent to proceed.  Melinda [name] is a 68 year old female who presents with breast pain and ER follow-up for gastrointestinal issues.  She experienced left-sided breast pain, specifically on the outside, for four days prior to her screening mammogram. The pain has since resolved, with no changes in the skin of the breast or nipple discharge.  She has not noticed any axillary adenopathy either.  She visited the ER on October 19th for abdominal pain, three days of watery diarrhea, and vomiting. No significant fever was noted. Lab work showed normal magnesium, normal kidney function, slightly elevated pancreatic enzymes, and a low white blood cell count of 3.3. She received zofran , fluids, and imodium , leading to symptom improvement.  Around Thanksgiving, she experienced a recurrence of symptoms including diarrhea and vomiting phlegm, lasting about two days. She had eaten pizza the night before but denies any known food allergies or recent travel. She has a history of opioid-induced constipation but does not frequently experience diarrhea or constipation otherwise, especially now that she is off narcotics.  She passed out on Thanksgiving, resulting in a fall and injury to her left knee. She visited [facility] the following day due to knee pain and swelling. An x-ray was performed, and she received a cortisone injection, which has helped reduce the pain and stiffness. The patient reports pain and stiffness in the knee, especially when bending or straightening it.  She mentions experiencing significant stress recently, including the loss of a friend, which she believes may have contributed to her passing out. [place] and is conscious of tick exposure.       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 b.) Doppler 06/28/2015: 40-59% BILATERAL ICA. c.) Dopplers 04/23/2019, 04/22/2020, 10/20/2021: 40-59% RICA and 1-39% LICA.   Blue toes    2nd toe on right foot, will get appt.   Bulging disc    CAD (coronary artery disease) 2009   a.) PCI with placement of a 15 x 30 mm Promus DES to mLAD. b.) LHC 02/27/2011: EF 60%; no sig CAD; mild lum irreg at ostium of diagnoal (jailed by stent).   Chicken pox    Chronic fatigue    Chronic, continuous use of opioids    CKD (chronic kidney disease), stage III (HCC)    COVID 02/22/2022   Degenerative disc disease    Depression    Diverticulosis    Facet joint disease    Failed cervical fusion    Fibromyalgia    Hiatal hernia    Hyperlipidemia    IBS (irritable bowel syndrome)    MCL deficiency, knee    MRSA (methicillin resistant staph aureus) culture positive 2011   GREAT TOE RIGHT FOOT   Neuropathy 04/18/2010   Orthostatic hypotension 01/2020   a.) multiple pre-syncopal episodes; takes proamatine    OSA on CPAP 08/17/2003   Osteoarthritis    a.) back, neck, hands,  knees   Restless leg syndrome    Sepsis (HCC) 09/2013   Sleep apnea 08/17/2003   Spinal headache 11/07/2021   Spinal stenosis    T2DM (type 2 diabetes mellitus) (HCC)    Wolff-Parkinson-White (WPW) syndrome 1992   a.) s/p ablations; unsuccessful. Now has rare episodes    Current Outpatient Medications  Medication Sig Dispense Refill   ACCU-CHEK GUIDE test strip USE 1 EACH BY OTHER ROUTE 3 (THREE) TIMES DAILY. USE AS INSTRUCTE 200 strip 1   Accu-Chek Softclix Lancets lancets USE TO CHECK BLOOD SUGAR 1 TIME DAILY 100 each 2   Ascorbic  Acid (VITAMIN C) 1000 MG tablet Take 1,000 mg by mouth daily.     ASPIRIN  81 PO Take 81 mg by mouth daily.     azelastine  (ASTELIN ) 0.1 % nasal spray Place 1 spray into both nostrils 2 (two) times daily. Use in each nostril as directed 30 mL 1   Biotin 89999 MCG TABS Take 10,000 mcg by mouth daily.     Blood Glucose Calibration (ACCU-CHEK GUIDE CONTROL) LIQD 1 each by In Vitro route as needed. 1 each 1   buPROPion  (WELLBUTRIN  XL) 150 MG 24 hr tablet TAKE 1 TABLET BY MOUTH EVERY DAY 90 tablet 0   CALCIUM -MAGNESIUM-ZINC  PO Take 3 capsules by mouth daily.     CINNAMON PO Take 1,000 mg by mouth daily.     Coenzyme Q10 (CO Q 10 PO) Take 1 tablet by mouth daily.     ezetimibe  (ZETIA ) 10 MG tablet Take 1 tablet (10 mg total) by mouth daily. 90 tablet 3   glucosamine-chondroitin 500-400 MG tablet Take 2 tablets by mouth daily.      loperamide  (IMODIUM ) 2 MG capsule Take 1 capsule (2 mg total) by mouth as needed for diarrhea or loose stools. 30 capsule 0   nitroGLYCERIN  (NITROSTAT ) 0.4 MG SL tablet PLACE 1 TABLET UNDER THE TONGUE EVERY 5 (FIVE) MINUTES AS NEEDED FOR CHEST PAIN. 25 tablet 4   Omega-3 Fatty Acids (FISH OIL) 1200 MG CAPS Take 1,200 mg by mouth in the morning.      ondansetron  (ZOFRAN -ODT) 4 MG disintegrating tablet Take 1 tablet (4 mg total) by mouth every 8 (eight) hours as needed for nausea or vomiting. 12 tablet 0   PARoxetine  (PAXIL ) 20 MG tablet TAKE 1/2 TABLET BY MOUTH DAILY IN THE MORNING 45 tablet 0   pregabalin  (LYRICA ) 150 MG capsule TAKE 1 CAPSULE (150 MG TOTAL) BY MOUTH IN THE MORNING, AT NOON, AND AT BEDTIME. 270 capsule 0   rosuvastatin  (CRESTOR ) 20 MG tablet TAKE 1 TABLET BY MOUTH EVERY DAY 90 tablet 3   tirzepatide  (MOUNJARO ) 2.5 MG/0.5ML Pen Inject 2.5 mg into the skin once a week. 6 mL 0   tiZANidine  (ZANAFLEX ) 2 MG tablet TAKE 1 TABLET (2 MG TOTAL) BY MOUTH IN THE MORNING AND AT BEDTIME. 180 tablet 1   vitamin E  180 MG (400 UNITS) capsule Take 400 Units by mouth daily.      No current facility-administered medications for this visit.    No Known Allergies  Family History  Problem Relation Age of Onset   Lung cancer Mother    Cancer Mother    Arthritis Father    Heart disease Father    Diabetes Father    Alcohol abuse Father    Cancer Father    Dementia Maternal Grandmother    Alcohol abuse Maternal Grandfather    Heart disease Maternal Grandfather    Alcohol abuse Paternal Grandmother  Arthritis Paternal Grandmother    COPD Paternal Grandmother    Diabetes Paternal Grandmother    Alcohol abuse Paternal Grandfather    Heart disease Paternal Grandfather    Stroke Paternal Grandfather    Cancer Paternal Grandfather    Vision loss Paternal Aunt    BRCA 1/2 Neg Hx    Breast cancer Neg Hx     Social History   Socioeconomic History   Marital status: Significant Other    Spouse name: Not on file   Number of children: Not on file   Years of education: Not on file   Highest education level: GED or equivalent  Occupational History   Not on file  Tobacco Use   Smoking status: Former    Current packs/day: 0.00    Average packs/day: 1.5 packs/day for 32.0 years (48.0 ttl pk-yrs)    Types: Cigarettes    Start date: 07/22/1975    Quit date: 07/22/2007    Years since quitting: 16.8   Smokeless tobacco: Former  Building Services Engineer status: Never Used  Substance and Sexual Activity   Alcohol use: Not Currently    Comment: occ - Holidays   Drug use: Not Currently    Comment: prescribed pain pump and oxy   Sexual activity: Yes  Other Topics Concern   Not on file  Social History Narrative   Lives at home with a partner. Independent at baseline   Social Drivers of Health   Financial Resource Strain: Low Risk  (05/29/2024)   Overall Financial Resource Strain (CARDIA)    Difficulty of Paying Living Expenses: Not hard at all  Food Insecurity: No Food Insecurity (05/29/2024)   Hunger Vital Sign    Worried About Running Out of Food in the  Last Year: Never true    Ran Out of Food in the Last Year: Never true  Transportation Needs: No Transportation Needs (05/29/2024)   PRAPARE - Administrator, Civil Service (Medical): No    Lack of Transportation (Non-Medical): No  Physical Activity: Insufficiently Active (05/29/2024)   Exercise Vital Sign    Days of Exercise per Week: 1 day    Minutes of Exercise per Session: 20 min  Stress: No Stress Concern Present (05/29/2024)   [employer] of Occupational Health - Occupational Stress Questionnaire    Feeling of Stress: Only a little  Social Connections: Moderately Isolated (05/29/2024)   Social Connection and Isolation Panel    Frequency of Communication with Friends and Family: Never    Frequency of Social Gatherings with Friends and Family: Once a week    Attends Religious Services: Never    Database Administrator or Organizations: Yes    Attends Banker Meetings: 1 to 4 times per year    Marital Status: Living with partner  Intimate Partner Violence: Not At Risk (12/20/2023)   Humiliation, Afraid, Rape, and Kick questionnaire    Fear of Current or Ex-Partner: No    Emotionally Abused: No    Physically Abused: No    Sexually Abused: No     Constitutional: Patient reports fatigue.  Denies fever, malaise, headache or abrupt weight changes.  HEENT: Denies eye pain, eye redness, ear pain, ringing in the ears, wax buildup, runny nose, nasal congestion, bloody nose, or sore throat. Respiratory: Denies difficulty breathing, shortness of breath, cough or sputum production.   Cardiovascular: Denies chest pain, chest tightness, palpitations or swelling in the hands or feet.  Gastrointestinal: Patient reports intermittent abdominal  cramping, vomiting and diarrhea.  Denies bloating, constipation, or blood in the stool.  GU: Denies urgency, frequency, pain with urination, burning sensation, blood in urine, odor or discharge. Musculoskeletal: Patient reports  chronic joint and muscle pain, right knee instability, acute left knee pain and swelling, difficulty with gait.  Denies decrease in range of motion.  Skin: Patient reports breast pain.  Denies redness, rashes, lesions or ulcercations.  Neurological: Patient reports neuropathic pain, balance problem. Denies dizziness, difficulty with memory, difficulty with speech or problems with coordination.  Psych: Patient has a history of anxiety and depression.  Denies SI/HI.  No other specific complaints in a complete review of systems (except as listed in HPI above).     Objective:   Physical Exam BP 134/78 (BP Location: Right Arm, Patient Position: Sitting, Cuff Size: Normal)   Ht 5' 9 (1.753 m)   Wt 168 lb 3.2 oz (76.3 kg)   LMP  (LMP Unknown)   BMI 24.84 kg/m     Wt Readings from Last 3 Encounters:  05/13/24 167 lb 9.6 oz (76 kg)  04/13/24 185 lb (83.9 kg)  03/16/24 161 lb (73 kg)    General: Appears her stated age, chronically ill-appearing, in NAD. Skin: Warm, dry and intact.  Bruising noted to the medial left knee. Breast: Exam deferred as she is not having any breast pain at this time. HEENT: Head: normal shape and size; Eyes: sclera white, no icterus, conjunctiva pink, PERRLA and EOMs intact;  Cardiovascular: Normal rate and rhythm. S1,S2 noted.  No murmur, rubs or gallops noted.  Pulmonary/Chest: Normal effort. No respiratory distress.  Abdomen: Normal bowel sounds.  Musculoskeletal: Joint enlargement noted in right knee. Scarring noted over the right knee.  1+ swelling of the left knee.  Decreased flexion and extension secondary to stiffness and pain.  Limping gait with use of cane. Neurological: Alert and oriented.  Psychiatric: Mood and affect normal. Tearful. Judgment and thought content normal.    BMET    Component Value Date/Time   NA 140 05/13/2024 1521   NA 139 10/23/2021 1552   NA 135 (L) 06/30/2013 1203   K 4.4 05/13/2024 1521   K 4.1 06/30/2013 1203   CL 102  05/13/2024 1521   CL 102 06/30/2013 1203   CO2 30 05/13/2024 1521   CO2 32 06/30/2013 1203   GLUCOSE 73 05/13/2024 1521   GLUCOSE 143 (H) 06/30/2013 1203   BUN 18 05/13/2024 1521   BUN 21 10/23/2021 1552   BUN 19 (H) 06/30/2013 1203   CREATININE 1.00 05/13/2024 1521   CREATININE 1.70 (H) 02/20/2024 1420   CALCIUM  9.1 05/13/2024 1521   CALCIUM  9.7 06/30/2013 1203   GFRNONAA >60 04/19/2024 0803   GFRNONAA 56 (L) 12/21/2020 0957   GFRAA 65 12/21/2020 0957    Lipid Panel     Component Value Date/Time   CHOL 112 02/20/2024 1420   TRIG 56 02/20/2024 1420   HDL 55 02/20/2024 1420   CHOLHDL 2.0 02/20/2024 1420   VLDL 31.6 03/12/2019 1255   LDLCALC 43 02/20/2024 1420    CBC    Component Value Date/Time   WBC 5.5 05/13/2024 1521   RBC 4.42 05/13/2024 1521   HGB 13.0 05/13/2024 1521   HGB 14.0 11/12/2011 1136   HCT 38.4 05/13/2024 1521   HCT 40.7 11/12/2011 1136   PLT 226.0 05/13/2024 1521   PLT 274 11/12/2011 1136   MCV 86.8 05/13/2024 1521   MCV 86 11/12/2011 1136   MCH 28.5 04/19/2024  0803   MCHC 34.0 05/13/2024 1521   RDW 13.1 05/13/2024 1521   RDW 12.8 11/12/2011 1136   LYMPHSABS 1.6 05/13/2024 1521   LYMPHSABS 2.5 11/12/2011 1136   MONOABS 0.5 05/13/2024 1521   MONOABS 0.8 11/12/2011 1136   EOSABS 0.2 05/13/2024 1521   EOSABS 0.6 11/12/2011 1136   BASOSABS 0.1 05/13/2024 1521   BASOSABS 0.1 11/12/2011 1136    Hgb A1C Lab Results  Component Value Date   HGBA1C 5.2 02/20/2024           Assessment & Plan:   Assessment and Plan    Left knee pain and swelling after fall Pain and swelling post-fall, no fractures on X-ray. Possible ligamentous injury or meniscal tear. - Monitor symptoms, allow swelling to decrease. - Recommend Ace wrap or compression sleeve to help with swelling and stability -Recommend ice for 10 minutes twice daily - Consider MRI if no improvement. - Use Tylenol  for pain as needed.  Syncope (suspected vasovagal,  post-illness) Likely vasovagal syncope related to stress and recent illness. No dehydration or hypoglycemia noted. - Ensure hydration and nutrition, eat every four hours. - Encourage stress reduction techniques. - Advised slow movements, avoid sudden position changes.  Breast pain, screening mammogram for breast cancer Previous mammogram not completed due to breast pain, now resolved. - Ordered screening mammogram, no current breast pain. - Advised to reschedule mammogram, contact if issues arise.     ER follow-up for viral gastroenteritis ER notes, labs reviewed.  Recent reoccurrence -Advised her to document what she eats prior to onset of symptoms so that we can see if there is any correlation -She has loperamide  at home to take if needed   RTC in 2 months, follow-up chronic conditions Angeline Laura, NP

## 2024-06-02 ENCOUNTER — Encounter: Payer: Self-pay | Admitting: Internal Medicine

## 2024-06-02 DIAGNOSIS — M25562 Pain in left knee: Secondary | ICD-10-CM

## 2024-06-04 ENCOUNTER — Other Ambulatory Visit: Payer: Self-pay | Admitting: Primary Care

## 2024-06-04 NOTE — Addendum Note (Signed)
 Addended by: ANTONETTE ANGELINE ORN on: 06/04/2024 01:46 PM   Modules accepted: Orders

## 2024-06-10 ENCOUNTER — Ambulatory Visit: Admission: RE | Admit: 2024-06-10 | Discharge: 2024-06-10 | Attending: Internal Medicine | Admitting: Internal Medicine

## 2024-06-10 DIAGNOSIS — M25562 Pain in left knee: Secondary | ICD-10-CM | POA: Insufficient documentation

## 2024-06-11 ENCOUNTER — Ambulatory Visit: Payer: Self-pay | Admitting: Internal Medicine

## 2024-06-11 ENCOUNTER — Encounter: Payer: Self-pay | Admitting: Internal Medicine

## 2024-06-11 DIAGNOSIS — S83282D Other tear of lateral meniscus, current injury, left knee, subsequent encounter: Secondary | ICD-10-CM

## 2024-08-02 ENCOUNTER — Other Ambulatory Visit: Payer: Self-pay | Admitting: Internal Medicine

## 2024-08-04 NOTE — Telephone Encounter (Signed)
 Requested Prescriptions  Pending Prescriptions Disp Refills   PARoxetine  (PAXIL ) 20 MG tablet [Pharmacy Med Name: PAROXETINE  HCL 20 MG TABLET] 45 tablet 0    Sig: TAKE 1/2 TABLET BY MOUTH DAILY IN THE MORNING     Psychiatry:  Antidepressants - SSRI Passed - 08/04/2024 10:48 AM      Passed - Completed PHQ-2 or PHQ-9 in the last 360 days      Passed - Valid encounter within last 6 months    Recent Outpatient Visits           2 months ago Viral gastroenteritis   [place] [facility] [place], Angeline ORN, NP   5 months ago Encounter for general adult medical examination with abnormal findings   [place] [facility] [place], Angeline ORN, NP   10 months ago Type 2 diabetes mellitus with diabetic mononeuropathy, without long-term current use of insulin  [facility])   [place] [facility] [place], [place] W, NP               buPROPion  (WELLBUTRIN  XL) 150 MG 24 hr tablet [Pharmacy Med Name: BUPROPION  HCL XL 150 MG TABLET] 90 tablet 0    Sig: TAKE 1 TABLET BY MOUTH EVERY DAY     Psychiatry: Antidepressants - bupropion  Passed - 08/04/2024 10:48 AM      Passed - Cr in normal range and within 360 days    Creat  Date Value Ref Range Status  02/20/2024 1.70 (H) 0.50 - 1.05 mg/dL Final   Creatinine, Ser  Date Value Ref Range Status  05/13/2024 1.00 0.40 - 1.20 mg/dL Final   Creatinine, Urine  Date Value Ref Range Status  02/20/2024 201 20 - 275 mg/dL Final         Passed - AST in normal range and within 360 days    AST  Date Value Ref Range Status  05/13/2024 19 0 - 37 U/L Final         Passed - ALT in normal range and within 360 days    ALT  Date Value Ref Range Status  05/13/2024 14 0 - 35 U/L Final         Passed - Completed PHQ-2 or PHQ-9 in the last 360 days      Passed - Last BP in normal range    BP Readings from Last 1 Encounters:  06/01/24 134/78         Passed - Valid encounter within last 6 months    Recent Outpatient Visits            2 months ago Viral gastroenteritis   [place] [facility] [place], Angeline ORN, NP   5 months ago Encounter for general adult medical examination with abnormal findings   [place] [facility] [place], [place] W, NP   10 months ago Type 2 diabetes mellitus with diabetic mononeuropathy, without long-term current use of insulin  [facility])    [place] [facility] [place], Angeline ORN, [place]

## 2024-08-18 ENCOUNTER — Ambulatory Visit: Admitting: Internal Medicine

## 2024-08-20 ENCOUNTER — Ambulatory Visit: Admitting: Internal Medicine

## 2024-12-25 ENCOUNTER — Ambulatory Visit

## 2025-01-06 ENCOUNTER — Ambulatory Visit
